# Patient Record
Sex: Female | Born: 1945 | Race: White | Hispanic: No | Marital: Married | Smoking: Former smoker
Health system: Southern US, Community
[De-identification: ages and names within clinical notes are randomized; demographics above are authoritative.]

## PROBLEM LIST (undated history)

## (undated) DIAGNOSIS — D649 Anemia, unspecified: Secondary | ICD-10-CM

## (undated) DIAGNOSIS — M48061 Spinal stenosis, lumbar region without neurogenic claudication: Secondary | ICD-10-CM

## (undated) DIAGNOSIS — Z8619 Personal history of other infectious and parasitic diseases: Secondary | ICD-10-CM

## (undated) DIAGNOSIS — R251 Tremor, unspecified: Secondary | ICD-10-CM

## (undated) DIAGNOSIS — E039 Hypothyroidism, unspecified: Secondary | ICD-10-CM

## (undated) DIAGNOSIS — K649 Unspecified hemorrhoids: Secondary | ICD-10-CM

## (undated) DIAGNOSIS — N189 Chronic kidney disease, unspecified: Secondary | ICD-10-CM

## (undated) DIAGNOSIS — K219 Gastro-esophageal reflux disease without esophagitis: Secondary | ICD-10-CM

## (undated) DIAGNOSIS — Z923 Personal history of irradiation: Secondary | ICD-10-CM

## (undated) DIAGNOSIS — R7303 Prediabetes: Secondary | ICD-10-CM

## (undated) DIAGNOSIS — Z972 Presence of dental prosthetic device (complete) (partial): Secondary | ICD-10-CM

## (undated) DIAGNOSIS — J449 Chronic obstructive pulmonary disease, unspecified: Secondary | ICD-10-CM

## (undated) DIAGNOSIS — Z170 Estrogen receptor positive status [ER+]: Secondary | ICD-10-CM

## (undated) DIAGNOSIS — Z860101 Personal history of adenomatous and serrated colon polyps: Secondary | ICD-10-CM

## (undated) DIAGNOSIS — Z9889 Other specified postprocedural states: Secondary | ICD-10-CM

## (undated) DIAGNOSIS — I10 Essential (primary) hypertension: Secondary | ICD-10-CM

## (undated) DIAGNOSIS — F1920 Other psychoactive substance dependence, uncomplicated: Secondary | ICD-10-CM

## (undated) DIAGNOSIS — R112 Nausea with vomiting, unspecified: Secondary | ICD-10-CM

## (undated) DIAGNOSIS — I519 Heart disease, unspecified: Secondary | ICD-10-CM

## (undated) DIAGNOSIS — G2581 Restless legs syndrome: Secondary | ICD-10-CM

## (undated) DIAGNOSIS — J189 Pneumonia, unspecified organism: Secondary | ICD-10-CM

## (undated) DIAGNOSIS — D013 Carcinoma in situ of anus and anal canal: Secondary | ICD-10-CM

## (undated) DIAGNOSIS — J302 Other seasonal allergic rhinitis: Secondary | ICD-10-CM

## (undated) DIAGNOSIS — M109 Gout, unspecified: Secondary | ICD-10-CM

## (undated) DIAGNOSIS — Z85828 Personal history of other malignant neoplasm of skin: Secondary | ICD-10-CM

## (undated) DIAGNOSIS — K5909 Other constipation: Secondary | ICD-10-CM

## (undated) DIAGNOSIS — K3184 Gastroparesis: Secondary | ICD-10-CM

## (undated) DIAGNOSIS — Z86010 Personal history of colonic polyps: Secondary | ICD-10-CM

## (undated) DIAGNOSIS — G252 Other specified forms of tremor: Secondary | ICD-10-CM

## (undated) DIAGNOSIS — M1990 Unspecified osteoarthritis, unspecified site: Secondary | ICD-10-CM

## (undated) DIAGNOSIS — K6282 Dysplasia of anus: Secondary | ICD-10-CM

## (undated) DIAGNOSIS — G629 Polyneuropathy, unspecified: Secondary | ICD-10-CM

## (undated) DIAGNOSIS — E785 Hyperlipidemia, unspecified: Secondary | ICD-10-CM

## (undated) DIAGNOSIS — C50212 Malignant neoplasm of upper-inner quadrant of left female breast: Secondary | ICD-10-CM

## (undated) DIAGNOSIS — F419 Anxiety disorder, unspecified: Secondary | ICD-10-CM

## (undated) DIAGNOSIS — I499 Cardiac arrhythmia, unspecified: Secondary | ICD-10-CM

## (undated) DIAGNOSIS — R569 Unspecified convulsions: Secondary | ICD-10-CM

## (undated) HISTORY — DX: Hyperlipidemia, unspecified: E78.5

## (undated) HISTORY — PX: RIGHT COLECTOMY: SHX853

## (undated) HISTORY — DX: Tremor, unspecified: R25.1

## (undated) HISTORY — DX: Gastro-esophageal reflux disease without esophagitis: K21.9

## (undated) HISTORY — DX: Gastroparesis: K31.84

## (undated) HISTORY — DX: Chronic obstructive pulmonary disease, unspecified: J44.9

## (undated) HISTORY — DX: Anxiety disorder, unspecified: F41.9

## (undated) HISTORY — DX: Gout, unspecified: M10.9

## (undated) HISTORY — PX: LUMBAR SPINE SURGERY: SHX701

## (undated) HISTORY — PX: BREAST LUMPECTOMY: SHX2

## (undated) HISTORY — DX: Prediabetes: R73.03

## (undated) HISTORY — DX: Restless legs syndrome: G25.81

## (undated) HISTORY — DX: Anemia, unspecified: D64.9

## (undated) HISTORY — PX: MOUTH SURGERY: SHX715

## (undated) HISTORY — PX: HAND SURGERY: SHX662

## (undated) HISTORY — PX: REDUCTION MAMMAPLASTY: SUR839

## (undated) HISTORY — PX: MULTIPLE TOOTH EXTRACTIONS: SHX2053

---

## 1967-04-19 HISTORY — PX: TONSILLECTOMY: SUR1361

## 1970-04-18 HISTORY — PX: BREAST EXCISIONAL BIOPSY: SUR124

## 1980-04-18 HISTORY — PX: TUBAL LIGATION: SHX77

## 1983-04-19 HISTORY — PX: ABDOMINAL HYSTERECTOMY: SHX81

## 1993-04-18 HISTORY — PX: CERVICAL SPINE SURGERY: SHX589

## 1996-04-18 HISTORY — PX: BREAST REDUCTION SURGERY: SHX8

## 1997-10-13 ENCOUNTER — Ambulatory Visit (HOSPITAL_COMMUNITY): Admission: RE | Admit: 1997-10-13 | Discharge: 1997-10-13 | Payer: Self-pay | Admitting: Gastroenterology

## 1997-10-16 ENCOUNTER — Ambulatory Visit (HOSPITAL_COMMUNITY): Admission: RE | Admit: 1997-10-16 | Discharge: 1997-10-16 | Payer: Self-pay | Admitting: Gastroenterology

## 1997-12-30 ENCOUNTER — Ambulatory Visit (HOSPITAL_BASED_OUTPATIENT_CLINIC_OR_DEPARTMENT_OTHER): Admission: RE | Admit: 1997-12-30 | Discharge: 1997-12-30 | Payer: Self-pay | Admitting: Orthopedic Surgery

## 1998-09-07 ENCOUNTER — Encounter: Payer: Self-pay | Admitting: Orthopedic Surgery

## 1998-09-07 ENCOUNTER — Ambulatory Visit (HOSPITAL_COMMUNITY): Admission: RE | Admit: 1998-09-07 | Discharge: 1998-09-07 | Payer: Self-pay | Admitting: Orthopedic Surgery

## 1998-11-24 ENCOUNTER — Encounter: Payer: Self-pay | Admitting: Gastroenterology

## 1998-11-24 ENCOUNTER — Ambulatory Visit (HOSPITAL_COMMUNITY): Admission: RE | Admit: 1998-11-24 | Discharge: 1998-11-24 | Payer: Self-pay | Admitting: Gastroenterology

## 2000-02-08 ENCOUNTER — Encounter: Payer: Self-pay | Admitting: Gastroenterology

## 2000-02-08 ENCOUNTER — Ambulatory Visit (HOSPITAL_COMMUNITY): Admission: RE | Admit: 2000-02-08 | Discharge: 2000-02-08 | Payer: Self-pay | Admitting: Gastroenterology

## 2000-04-18 HISTORY — PX: KNEE ARTHROSCOPY: SHX127

## 2001-02-20 ENCOUNTER — Encounter: Payer: Self-pay | Admitting: Gastroenterology

## 2001-02-20 ENCOUNTER — Encounter: Admission: RE | Admit: 2001-02-20 | Discharge: 2001-02-20 | Payer: Self-pay | Admitting: Gastroenterology

## 2001-06-19 ENCOUNTER — Encounter (INDEPENDENT_AMBULATORY_CARE_PROVIDER_SITE_OTHER): Payer: Self-pay | Admitting: *Deleted

## 2001-06-19 ENCOUNTER — Ambulatory Visit (HOSPITAL_COMMUNITY): Admission: RE | Admit: 2001-06-19 | Discharge: 2001-06-19 | Payer: Self-pay | Admitting: Gastroenterology

## 2002-02-19 ENCOUNTER — Ambulatory Visit (HOSPITAL_BASED_OUTPATIENT_CLINIC_OR_DEPARTMENT_OTHER): Admission: RE | Admit: 2002-02-19 | Discharge: 2002-02-19 | Payer: Self-pay | Admitting: Orthopedic Surgery

## 2002-12-04 ENCOUNTER — Ambulatory Visit (HOSPITAL_COMMUNITY): Admission: RE | Admit: 2002-12-04 | Discharge: 2002-12-04 | Payer: Self-pay | Admitting: Gastroenterology

## 2002-12-04 ENCOUNTER — Encounter: Payer: Self-pay | Admitting: Gastroenterology

## 2003-09-10 ENCOUNTER — Encounter (INDEPENDENT_AMBULATORY_CARE_PROVIDER_SITE_OTHER): Payer: Self-pay | Admitting: Specialist

## 2003-09-10 ENCOUNTER — Inpatient Hospital Stay (HOSPITAL_COMMUNITY): Admission: RE | Admit: 2003-09-10 | Discharge: 2003-09-17 | Payer: Self-pay | Admitting: Surgery

## 2004-04-09 ENCOUNTER — Ambulatory Visit (HOSPITAL_COMMUNITY): Admission: RE | Admit: 2004-04-09 | Discharge: 2004-04-09 | Payer: Self-pay | Admitting: Gastroenterology

## 2005-07-20 ENCOUNTER — Ambulatory Visit (HOSPITAL_COMMUNITY): Admission: RE | Admit: 2005-07-20 | Discharge: 2005-07-20 | Payer: Self-pay | Admitting: Gastroenterology

## 2006-04-18 HISTORY — PX: HEMORRHOID SURGERY: SHX153

## 2006-08-01 ENCOUNTER — Ambulatory Visit (HOSPITAL_COMMUNITY): Admission: RE | Admit: 2006-08-01 | Discharge: 2006-08-01 | Payer: Self-pay | Admitting: Gastroenterology

## 2007-08-10 ENCOUNTER — Ambulatory Visit (HOSPITAL_COMMUNITY): Admission: RE | Admit: 2007-08-10 | Discharge: 2007-08-10 | Payer: Self-pay | Admitting: Gastroenterology

## 2008-04-15 ENCOUNTER — Encounter: Admission: RE | Admit: 2008-04-15 | Discharge: 2008-04-15 | Payer: Self-pay | Admitting: Sports Medicine

## 2008-11-11 ENCOUNTER — Ambulatory Visit (HOSPITAL_COMMUNITY): Admission: RE | Admit: 2008-11-11 | Discharge: 2008-11-11 | Payer: Self-pay | Admitting: Internal Medicine

## 2010-05-09 ENCOUNTER — Encounter: Payer: Self-pay | Admitting: Internal Medicine

## 2010-05-18 ENCOUNTER — Other Ambulatory Visit (HOSPITAL_COMMUNITY): Payer: Self-pay | Admitting: Internal Medicine

## 2010-05-18 DIAGNOSIS — Z139 Encounter for screening, unspecified: Secondary | ICD-10-CM

## 2010-05-21 ENCOUNTER — Ambulatory Visit (HOSPITAL_COMMUNITY)
Admission: RE | Admit: 2010-05-21 | Discharge: 2010-05-21 | Disposition: A | Discharge status: Home / Self Care | Payer: BC Managed Care – PPO | Source: Ambulatory Visit | Attending: Internal Medicine | Admitting: Internal Medicine

## 2010-05-21 DIAGNOSIS — Z139 Encounter for screening, unspecified: Secondary | ICD-10-CM

## 2010-05-21 DIAGNOSIS — Z1231 Encounter for screening mammogram for malignant neoplasm of breast: Secondary | ICD-10-CM

## 2010-09-03 NOTE — Op Note (Signed)
NAME:  NAVIL, KOLE                        ACCOUNT NO.:  [id number]   MEDICAL RECORD NO.:  [id number]                   PATIENT TYPE:  AMB   LOCATION:  DSC                                  FACILITY:  [facility]   PHYSICIAN:  Cindee Salt, M.D.                    DATE OF BIRTH:  [birth year]   DATE OF PROCEDURE:  02/19/2002  DATE OF DISCHARGE:                                 OPERATIVE REPORT   PREOPERATIVE DIAGNOSIS:  Ruptured ulnar collateral ligament, metaphalangeal  joint, left thumb.   POSTOPERATIVE DIAGNOSIS:  Ruptured ulnar collateral ligament, metaphalangeal  joint, left thumb.   OPERATION:  Repair of ulnar collateral ligament, metacarpal phalangeal  joint, left thumb.   SURGEON:  Cindee Salt, M.D.   ASSISTANT:  RN.   ANESTHESIA:  General.   HISTORY:  The patient is a 65 year-old female who suffered a fall  approximately two weeks ago suffering an injury to the metacarpal phalangeal  joint of her thumb. She has pain on the ulnar aspect with gross opening on  stress.   PROCEDURE:  The patient was brought to the operating room where a general  anesthetic was carried out without difficulty.  She was prepped and draped  using Duraprep in the supine position.  Her left arm was freed.  The limb  was exsanguinated with an Esmarch bandage.  The tourniquet was placed high  on the arm and was inflated to 250 mmHg. A Chevron incision was made over  the metaphalangeal joint, ulnar aspect, apex and carried down through  subcutaneous tissue.  Bleeders were electrocauterized. The dorsal sensory  nerve was identified and protected.  An incision was then made in the  adductor aponeurosis. A complete tear of the ulnar collateral ligament off  from the metacarpal head was noted.  The attachment to the proximal phalanx  was intact.  A drill hole was then placed into the metacarpal head.  Using a  3-5 K wire holes were placed into the radial side. A 2-0 Prolene suture was  then used to repair  the collateral ligament into the metacarpal neck at the  old attachment.  This was done with a Bunnell pull type suture but was tied  over the metacarpal head on the radial aspect.  This firmly fixed the joint  in position.  The joint was then pinned with the 3-5 K wire in approximately  five degrees of flexion.  X-rays revealed the pin in good position with no  opening to the ulnar side.  The wound was irrigated.  The adductor  aponeurosis was closed with running 4-0 Mersilene suture.  The skin was  closed with interrupted 5-0 nylon sutures. A sterile compressive dressing  and splint were applied.  The patient tolerated the procedure well and was  taken to the recovery room for observation in satisfactory condition.  She  is discharged home to [facility] [place] [facility] one week on  Percocet and Septra DS.                                               Cindee Salt, M.D.    GK/MEDQ  D:  02/19/2002  T:  02/19/2002  Job:  [id number]

## 2010-09-03 NOTE — Discharge Summary (Signed)
NAME:  LIBERTI, APPLETON                        ACCOUNT NO.:  [id number]   MEDICAL RECORD NO.:  [id number]                   PATIENT TYPE:  INP   LOCATION:  0454                                 FACILITY:  [facility]   PHYSICIAN:  Currie Paris, M.D.           DATE OF BIRTH:  1945/11/29   DATE OF ADMISSION:  09/10/2003  DATE OF DISCHARGE:  09/17/2003                                 DISCHARGE SUMMARY   OFFICE MEDICAL RECORD NUMBER:  [id number].   FINAL DIAGNOSIS:  Multiple ascending colon polyps.   CLINICAL HISTORY:  Ms. Sorto is a 65 year old, found to have multiple cecal  polyps not amenable to colonoscopic resection.  She is scheduled for right  colectomy.   HOSPITAL COURSE:  The patient was admitted and taken to the operating room  on May 25, where a right colectomy was performed.  She tolerated the  procedure well.  Postoperatively, she had a reasonably benign postoperative  course.  She had nausea.  She did have some itching which was questionably  related to her pain medications.  She was started on clear liquids on May  27.  Diet gradually advanced and by June 1, was passing gas, had no nausea,  felt able to go home.  She was given Dulcolax suppository, tolerated that  well, and was discharged later on June 1.   She was discharged to resume her usual medications, Darvocet-N 100 for pain.   Laboratory studies include a preop hemoglobin of 14.8, white count of 9200,  a normal CMET except for nonfasting glucose of 129, a normal urinalysis, an  EKG which was normal, two-view x-ray which showed a little scarring  atelectasis in the lingula but otherwise negative, and a path report which  showed tubular adenomas and hyperplastic polyps of the cecum.                                               Currie Paris, M.D.    CJS/MEDQ  D:  10/11/2003  T:  10/11/2003  Job:  [id number]   cc:   Tasia Catchings, M.D.  [address]  [place]  [place] [address]  Fax: [phone]

## 2010-09-03 NOTE — Op Note (Signed)
NAME:  Brandi Stephens, Brandi Stephens                        ACCOUNT NO.:  [id number]   MEDICAL RECORD NO.:  [id number]                   PATIENT TYPE:  INP   LOCATION:  0001                                 FACILITY:  [facility]   PHYSICIAN:  Currie Paris, M.D.           DATE OF BIRTH:  Feb 16, 1946   DATE OF PROCEDURE:  09/10/2003  DATE OF DISCHARGE:                                 OPERATIVE REPORT   OFFICE MEDICAL RECORD NUMBER:  [id number]   PREOPERATIVE DIAGNOSIS:  Multiple cecal polyps.   POSTOPERATIVE DIAGNOSIS:  Multiple cecal polyps.   OPERATION:  Right colectomy.   SURGEON:  Dr. Jamey Ripa   ASSISTANT:  Dr. Derrell Lolling   ANESTHESIA:  General endotracheal.   CLINICAL HISTORY:  This patient has been evaluated by Dr. Sherin Quarry and found  to have multiple cecal polyps, one of which was too large to remove and at  least by biopsy was benign adenomatous polyp.  The patient elected to  proceed to elective right colectomy and was admitted for surgery.   DESCRIPTION OF PROCEDURE:  The patient was seen in the holding area.  We  reviewed the plans, and she had no further questions.  She was taken to the  operating room and after satisfactory general endotracheal anesthesia had  been obtained, a Foley catheter was placed and the abdomen prepped and  draped.  A transverse incision was made in the right upper quadrant, just  above the umbilicus, and the peritoneal cavity entered under direct vision.  Bimanual exam of the abdomen showed no gross abnormalities, although there  was some polypoid areas palpable in the cecum.   A wound protector was placed and the self-retaining retractor.  The  ascending colon was mobilized and incised along the white line using the  cautery and then freeing up the appendix and terminal ileum from the pelvis  where there were a few adhesions.  This was all done with the cautery and  appeared to be dry.   Once this was done, I could select an area just to the terminal  ileum to  divide the small bowel and found a window in the proximal right transverse  colon mesentery where we could make an opening and find a point to divide  the colon just to the right of the middle colic.  I divided the colon  mesentery down to this little window, ligament onto a silk.  From that  point, I then went up to the terminal ileum, again dividing the mesentery  between clamps and tying with 2-0 silks.  Once this was all done, we spent a  minute trying to make sure everything appeared to be dry and then did the  anastomosis.   The small bowel and colon were tacked together with the silk suture, and the  antimesenteric border of each opened.  The GIA was inserted and fired,  producing a side-to-side stapled anastomosis.  The anastomosis appeared to  be dry.  Using the TA stapler, coming transversely, the common defect was  closed.  The stapler was fired, and the specimen cut off the stapler.  The  staple line appeared to be dry.  The small bowel mesenteric opening was then  closed with 3-0 silks.  We changed gloves and instruments and then  irrigated, checked to make everything was dry and once everything appeared  to be dry, went ahead and closed.  Peritoneum was closed with a running PDS.  The muscle was irrigated and the fascia closed with #1 PDS, the subcu  irrigated and the skin closed with staples.   The patient tolerated the procedure well.  There were no operative  complications.  Estimated blood loss was 150 mL.  All counts were correct.                                               Currie Paris, M.D.    CJS/MEDQ  D:  09/10/2003  T:  09/10/2003  Job:  [id number]   cc:   Tasia Catchings, M.D.  [address]  [place]  [place] [address]  Fax: [phone]

## 2011-03-04 ENCOUNTER — Other Ambulatory Visit (HOSPITAL_COMMUNITY): Payer: Self-pay | Admitting: Internal Medicine

## 2011-03-04 DIAGNOSIS — N289 Disorder of kidney and ureter, unspecified: Secondary | ICD-10-CM

## 2011-03-04 DIAGNOSIS — R791 Abnormal coagulation profile: Secondary | ICD-10-CM

## 2011-03-04 DIAGNOSIS — R05 Cough: Secondary | ICD-10-CM

## 2011-03-07 ENCOUNTER — Other Ambulatory Visit (HOSPITAL_COMMUNITY): Payer: BC Managed Care – PPO

## 2011-03-08 ENCOUNTER — Other Ambulatory Visit (HOSPITAL_COMMUNITY): Payer: Self-pay | Admitting: Internal Medicine

## 2011-03-08 DIAGNOSIS — N289 Disorder of kidney and ureter, unspecified: Secondary | ICD-10-CM

## 2011-03-08 DIAGNOSIS — R05 Cough: Secondary | ICD-10-CM

## 2011-03-08 DIAGNOSIS — R791 Abnormal coagulation profile: Secondary | ICD-10-CM

## 2011-03-11 ENCOUNTER — Other Ambulatory Visit (HOSPITAL_COMMUNITY): Payer: Self-pay | Admitting: Internal Medicine

## 2011-03-11 ENCOUNTER — Ambulatory Visit (HOSPITAL_COMMUNITY)
Admission: RE | Admit: 2011-03-11 | Discharge: 2011-03-11 | Disposition: A | Discharge status: Home / Self Care | Payer: BC Managed Care – PPO | Source: Ambulatory Visit | Attending: Internal Medicine | Admitting: Internal Medicine

## 2011-03-11 ENCOUNTER — Encounter (HOSPITAL_COMMUNITY)
Admission: RE | Admit: 2011-03-11 | Discharge: 2011-03-11 | Disposition: A | Discharge status: Home / Self Care | Payer: BC Managed Care – PPO | Source: Ambulatory Visit | Attending: Internal Medicine | Admitting: Internal Medicine

## 2011-03-11 ENCOUNTER — Ambulatory Visit (HOSPITAL_COMMUNITY): Payer: BC Managed Care – PPO

## 2011-03-11 DIAGNOSIS — N289 Disorder of kidney and ureter, unspecified: Secondary | ICD-10-CM

## 2011-03-11 DIAGNOSIS — R05 Cough: Secondary | ICD-10-CM

## 2011-03-11 DIAGNOSIS — R059 Cough, unspecified: Secondary | ICD-10-CM | POA: Insufficient documentation

## 2011-03-11 DIAGNOSIS — M549 Dorsalgia, unspecified: Secondary | ICD-10-CM | POA: Insufficient documentation

## 2011-03-11 DIAGNOSIS — R791 Abnormal coagulation profile: Secondary | ICD-10-CM | POA: Insufficient documentation

## 2011-03-11 MED ORDER — XENON XE 133 GAS
5.8000 | GAS_FOR_INHALATION | Freq: Once | RESPIRATORY_TRACT | Status: AC | PRN
  Administered 2011-03-11: 5.8 via RESPIRATORY_TRACT

## 2011-03-11 MED ORDER — TECHNETIUM TO 99M ALBUMIN AGGREGATED
5.0000 | Freq: Once | INTRAVENOUS | Status: AC | PRN
  Administered 2011-03-11: 5 via INTRAVENOUS

## 2013-01-11 ENCOUNTER — Other Ambulatory Visit (HOSPITAL_COMMUNITY): Payer: Self-pay | Admitting: Internal Medicine

## 2013-01-11 ENCOUNTER — Other Ambulatory Visit (HOSPITAL_COMMUNITY)
Admission: RE | Admit: 2013-01-11 | Discharge: 2013-01-11 | Disposition: A | Discharge status: Home / Self Care | Payer: Medicare PPO | Source: Ambulatory Visit | Attending: Internal Medicine | Admitting: Internal Medicine

## 2013-01-11 ENCOUNTER — Other Ambulatory Visit: Payer: Self-pay | Admitting: Internal Medicine

## 2013-01-11 DIAGNOSIS — Z124 Encounter for screening for malignant neoplasm of cervix: Secondary | ICD-10-CM | POA: Insufficient documentation

## 2013-01-11 DIAGNOSIS — Z1231 Encounter for screening mammogram for malignant neoplasm of breast: Secondary | ICD-10-CM

## 2013-01-16 ENCOUNTER — Other Ambulatory Visit: Payer: Self-pay | Admitting: Gastroenterology

## 2013-01-16 HISTORY — PX: COLONOSCOPY: SHX174

## 2013-01-16 LAB — HM COLONOSCOPY

## 2013-01-18 ENCOUNTER — Ambulatory Visit (HOSPITAL_COMMUNITY): Payer: BC Managed Care – PPO

## 2013-03-04 ENCOUNTER — Other Ambulatory Visit: Payer: Self-pay | Admitting: Internal Medicine

## 2013-03-04 DIAGNOSIS — G250 Essential tremor: Secondary | ICD-10-CM

## 2013-03-07 ENCOUNTER — Ambulatory Visit
Admission: RE | Admit: 2013-03-07 | Discharge: 2013-03-07 | Disposition: A | Discharge status: Home / Self Care | Payer: Medicare PPO | Source: Ambulatory Visit | Attending: Internal Medicine | Admitting: Internal Medicine

## 2013-03-07 DIAGNOSIS — G250 Essential tremor: Secondary | ICD-10-CM

## 2013-03-19 ENCOUNTER — Encounter (INDEPENDENT_AMBULATORY_CARE_PROVIDER_SITE_OTHER): Payer: Self-pay

## 2013-03-19 ENCOUNTER — Ambulatory Visit (INDEPENDENT_AMBULATORY_CARE_PROVIDER_SITE_OTHER): Payer: Medicare PPO | Admitting: Neurology

## 2013-03-19 ENCOUNTER — Encounter: Payer: Self-pay | Admitting: Neurology

## 2013-03-19 VITALS — BP 130/82 | HR 110 | Ht <= 58 in | Wt 253.0 lb

## 2013-03-19 DIAGNOSIS — R259 Unspecified abnormal involuntary movements: Secondary | ICD-10-CM

## 2013-03-19 DIAGNOSIS — R202 Paresthesia of skin: Secondary | ICD-10-CM

## 2013-03-19 DIAGNOSIS — R209 Unspecified disturbances of skin sensation: Secondary | ICD-10-CM

## 2013-03-19 DIAGNOSIS — R251 Tremor, unspecified: Secondary | ICD-10-CM

## 2013-03-19 MED ORDER — PRIMIDONE 50 MG PO TABS: 25.0000 mg | ORAL_TABLET | Freq: Every day | ORAL | Status: DC

## 2013-03-19 NOTE — Patient Instructions (Signed)
Overall you are doing fairly well but I do want to suggest a few things today:   As far as your medications are concerned, I would like to suggest starting a medication called Primidone. Start by taking 1/2 tablet nightly. This medicine can sometimes be sedating.   As far as diagnostic testing:  1)MRI of the cervical spine  I would like to see you back once the MRI is completed, sooner if we need to. Please call us with any interim questions, concerns, problems, updates or refill requests.   Please also call us for any test results so we can go over those with you on the phone.  My clinical assistant and will answer any of your questions and relay your messages to me and also relay most of my messages to you.   Our phone number is [phone]. We also have an after hours call service for urgent matters and there is a physician on-call for urgent questions. For any emergencies you know to call 911 or go to the nearest emergency room

## 2013-03-19 NOTE — Progress Notes (Signed)
[facility]    Provider:  Dr Hosie Poisson Referring Provider: Raelyn Mora, Ibt* Primary Care Physician:  Raelyn Mora, Brent Bulla, MD  CC:  Tremor  HPI:  Brandi Stephens is a 67 y.o. female here as a referral from Dr. Raelyn Mora for paresthesias and tremor  Notes that tremor has been ongoing for years, son passed away around 3 years ago and feel it has gotten progressively worse since then. Is predominantly an intention/posutral tremor, mild rest component. Really notices tremor when trying to put on makeup. The tremor comes and goes, worse with stress and anxiety. Thinks glass of wine at night may help the tremor. Denies any generalized bradykinesia, no gait instability. Handwriting has gotten messier, thinks it may have gotten slightly smaller. Her mother had a similar tremor. No change in sense of smell. Has trouble staying asleep. Takes tylenol PM. No REM behavior disorder. No family history of any other neurodegenerative processes. No exposure to dopamine blocking agents. Feels that tremor is bothersome, socially is embarrassing.   Also notes history of paresthesias in arms and legs. Described as a pins and needles sensation. Describes distal to proximal pattern in extremities. Feels it is getting progressively worse. Denies any weakness. Has history of cervical degenerative changes, had surgery done at C4-5 or C5-6 (cannot recall level), done in the 90s. Initially after surgery, left arm was numb but has gotten worse in the past few months. Recently had normal head CT and normal B12 level.   Otherwise healthy. Denies any DM.    Review of Systems: Out of a complete 14 system review, the patient complains of only the following symptoms, and all other reviewed systems are negative. Positive for tremor  History   Social History  . Marital Status: Married    Spouse Name: N/A    Number of Children: N/A  . Years of Education: N/A   Occupational History    . Not on file.   Social History Main Topics  . Smoking status: Not on file  . Smokeless tobacco: Not on file  . Alcohol Use: Not on file  . Drug Use: Not on file  . Sexual Activity: Not on file   Other Topics Concern  . Not on file   Social History Narrative  . No narrative on file    No family history on file.  No past medical history on file.  No past surgical history on file.  Current Outpatient Prescriptions  Medication Sig Dispense Refill  . allopurinol (ZYLOPRIM) 100 MG tablet Take 1 tablet by mouth daily.      Marland Kitchen atorvastatin (LIPITOR) 20 MG tablet Take 1 tablet by mouth daily.      Marland Kitchen FLUoxetine (PROZAC) 20 MG capsule Take 1 capsule by mouth daily.      Marland Kitchen levothyroxine (SYNTHROID, LEVOTHROID) 50 MCG tablet Take 1 tablet by mouth daily.      Marland Kitchen LORazepam (ATIVAN) 1 MG tablet Take 1 tablet by mouth 2 (two) times daily as needed.      . valsartan-hydrochlorothiazide (DIOVAN-HCT) 320-12.5 MG per tablet Take 1 tablet by mouth daily.       No current facility-administered medications for this visit.    Allergies as of 03/19/2013 - Review Complete 03/19/2013  Allergen Reaction Noted  . Codeine  03/11/2011  . Morphine and related  03/11/2011  . Penicillins  03/11/2011    Vitals: BP 130/82  Pulse 110  Ht 4' 9.75" (1.467 m)  Wt 253 lb (114.76 kg)  BMI 53.32  kg/m2 Last Weight:  Wt Readings from Last 1 Encounters:  03/19/13 253 lb (114.76 kg)   Last Height:   Ht Readings from Last 1 Encounters:  03/19/13 4' 9.75" (1.467 m)     Physical exam: Exam: Gen: NAD, conversant Eyes: anicteric sclerae, moist conjunctivae HENT: Atraumatic, oropharynx clear Neck: Trachea midline; supple,  Lungs: CTA, no wheezing, rales, rhonic                          CV: RRR, no MRG Abdomen: Soft, non-tender;  Extremities: No peripheral edema  Skin: Normal temperature, no rash,  Psych: Appropriate affect, pleasant  Neuro: MS: AA&Ox3, appropriately interactive, normal affect    Speech: fluent w/o paraphasic error  Memory: good recent and remote recall  CN: PERRL, EOMI no nystagmus, no ptosis, sensation intact to LT V1-V3 bilat, face symmetric, no weakness, hearing grossly intact, palate elevates symmetrically, shoulder shrug 5/5 bilat,  tongue protrudes midline, no fasiculations noted.  Motor: normal bulk and tone Strength: 5/5  In all extremities  Coord: No rest tremor noted, bilateral left greater than right postural and intention tremor no bradykinesia noted   Reflexes: symmetrical, bilat downgoing toes  Sens: LT intact in all extremities  Gait: posture, stance, stride and arm-swing normal. Tandem gait intact. Romberg absent.   Assessment:  After physical and neurologic examination, review of laboratory studies, imaging, neurophysiology testing and pre-existing records, assessment will be reviewed on the problem list.  Plan:  Treatment plan and additional workup will be reviewed under Problem List.  1)Tremor 2)Paresthesias  67 year old woman presenting for initial evaluation of tremor and paresthesias of all 4 extremities. 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 This diagnosis was discussed extensively with patient. Will start patient on low-dose primidone, 25 mg, nightly. Can titrate this up in the future as tolerated. Unclear etiology of her paresthesias, based the fact that all 4 extremities are involved and she is history of degenerative cervical changes will order MRI of the C-spine at this time. Followup once MRI completed.     Elspeth Cho, DO  [facility] [address] [place], [place] [address]  Phone [phone] Fax [phone]

## 2013-04-01 DIAGNOSIS — R202 Paresthesia of skin: Secondary | ICD-10-CM | POA: Insufficient documentation

## 2013-04-02 ENCOUNTER — Ambulatory Visit
Admission: RE | Admit: 2013-04-02 | Discharge: 2013-04-02 | Disposition: A | Discharge status: Home / Self Care | Payer: Medicare PPO | Source: Ambulatory Visit | Attending: Neurology | Admitting: Neurology

## 2013-04-02 DIAGNOSIS — R202 Paresthesia of skin: Secondary | ICD-10-CM

## 2013-04-02 DIAGNOSIS — R209 Unspecified disturbances of skin sensation: Secondary | ICD-10-CM

## 2013-04-05 ENCOUNTER — Telehealth: Payer: Self-pay | Admitting: Neurology

## 2013-04-05 ENCOUNTER — Other Ambulatory Visit: Payer: Self-pay | Admitting: Neurology

## 2013-04-05 DIAGNOSIS — M549 Dorsalgia, unspecified: Secondary | ICD-10-CM

## 2013-04-05 MED ORDER — GABAPENTIN 100 MG PO CAPS: 100.0000 mg | ORAL_CAPSULE | Freq: Three times a day (TID) | ORAL | Status: DC

## 2013-04-05 NOTE — Telephone Encounter (Signed)
Please let her know I sent a prescription for neurontin 100mg  capsules to be taken 3 times a day. Thanks.

## 2013-04-05 NOTE — Telephone Encounter (Signed)
I called patient and let her know an Rx for neurontin was called in to her [facility] for her. I reviewed instructions for taking.

## 2013-04-05 NOTE — Telephone Encounter (Signed)
MRI results discussed with patient. Will place referral for PT evaluation.

## 2013-04-18 DIAGNOSIS — Z9889 Other specified postprocedural states: Secondary | ICD-10-CM

## 2013-04-18 DIAGNOSIS — Z85828 Personal history of other malignant neoplasm of skin: Secondary | ICD-10-CM

## 2013-04-18 HISTORY — DX: Personal history of other malignant neoplasm of skin: Z85.828

## 2013-04-18 HISTORY — DX: Other specified postprocedural states: Z98.890

## 2013-11-14 ENCOUNTER — Encounter: Payer: Self-pay | Admitting: Neurology

## 2013-11-14 ENCOUNTER — Ambulatory Visit (INDEPENDENT_AMBULATORY_CARE_PROVIDER_SITE_OTHER): Payer: Medicare PPO | Admitting: Neurology

## 2013-11-14 ENCOUNTER — Ambulatory Visit: Payer: Medicare PPO | Admitting: Neurology

## 2013-11-14 VITALS — BP 144/85 | HR 86 | Ht 59.0 in | Wt 252.0 lb

## 2013-11-14 DIAGNOSIS — M5090 Cervical disc disorder, unspecified, unspecified cervical region: Secondary | ICD-10-CM

## 2013-11-14 DIAGNOSIS — R259 Unspecified abnormal involuntary movements: Secondary | ICD-10-CM

## 2013-11-14 DIAGNOSIS — R251 Tremor, unspecified: Secondary | ICD-10-CM

## 2013-11-14 MED ORDER — PRIMIDONE 50 MG PO TABS: 25.0000 mg | ORAL_TABLET | Freq: Every day | ORAL | Status: DC

## 2013-11-14 NOTE — Progress Notes (Signed)
[facility]    Provider:  Dr Janann Colonel Referring Provider: Nena Jordan, Ibt* Primary Care Physician:  Nena Jordan, Mammie Lorenzo, MD  CC:  Tremor  HPI:  Brandi Stephens is a 68 y.o. female here as a follow up from Dr. Nena Jordan for paresthesias and tremor. At last visit her tremor was consistent with a diagnosis of ET and she was started on Primidone 25mg  nightly. She also had a MRI of the C spine completed which showed multilevel degenerative changes. Since last visit she notes worsening of her neck pain and some shooting pain radiating down her left arm. Mainly into the C6 distribution. Notes some weakness. Described as a paresthesia and burning sensation.   Also notes some pain in her lumbar spine and radiating pain down the lateral portion of the right leg to her knee. Comes and goes, is getting progressively worse. Described as a sharp stabbing pain. Having a hard time walking without pain, trouble bending down to pick up her grandchild. No change in bowel/bladder.  Notes the tremor is improved on the primidone. Tolerating primidone well, no excessive fatigue.    Initial visit  Notes that tremor has been ongoing for years, son passed away around 3 years ago and feel it has gotten progressively worse since then. Is predominantly an intention/posutral tremor, mild rest component. Really notices tremor when trying to put on makeup. The tremor comes and goes, worse with stress and anxiety. Thinks glass of wine at night may help the tremor. Denies any generalized bradykinesia, no gait instability. Handwriting has gotten messier, thinks it may have gotten slightly smaller. Her mother had a similar tremor. No change in sense of smell. Has trouble staying asleep. Takes tylenol PM. No REM behavior disorder. No family history of any other neurodegenerative processes. No exposure to dopamine blocking agents. Feels that tremor is bothersome, socially is embarrassing.    Also notes history of paresthesias in arms and legs. Described as a pins and needles sensation. Describes distal to proximal pattern in extremities. Feels it is getting progressively worse. Denies any weakness. Has history of cervical degenerative changes, had surgery done at C4-5 or C5-6 (cannot recall level), done in the 90s. Initially after surgery, left arm was numb but has gotten worse in the past few months. Recently had normal head CT and normal B12 level.   Otherwise healthy. Denies any DM.    Review of Systems: Out of a complete 14 system review, the patient complains of only the following symptoms, and all other reviewed systems are negative. Positive for tremor, back pain, joint swelling, neck stiffness, numbness,   History   Social History  . Marital Status: Married    Spouse Name: N/A    Number of Children: N/A  . Years of Education: N/A   Occupational History  . Not on file.   Social History Main Topics  . Smoking status: Never Smoker   . Smokeless tobacco: Not on file  . Alcohol Use: Not on file  . Drug Use: Not on file  . Sexual Activity: Not on file   Other Topics Concern  . Not on file   Social History Narrative  . No narrative on file    No family history on file.  No past medical history on file.  No past surgical history on file.  Current Outpatient Prescriptions  Medication Sig Dispense Refill  . allopurinol (ZYLOPRIM) 100 MG tablet Take 1 tablet by mouth daily.      Marland Kitchen atorvastatin (LIPITOR)  20 MG tablet Take 1 tablet by mouth daily.      Marland Kitchen FLUoxetine (PROZAC) 20 MG capsule Take 1 capsule by mouth daily.      Marland Kitchen gabapentin (NEURONTIN) 100 MG capsule Take 1 capsule (100 mg total) by mouth 3 (three) times daily.  90 capsule  3  . levothyroxine (SYNTHROID, LEVOTHROID) 50 MCG tablet Take 1 tablet by mouth daily.      Marland Kitchen LORazepam (ATIVAN) 1 MG tablet Take 1 tablet by mouth 2 (two) times daily as needed.      . primidone (MYSOLINE) 50 MG tablet Take  0.5 tablets (25 mg total) by mouth at bedtime.  60 tablet  3  . valsartan-hydrochlorothiazide (DIOVAN-HCT) 320-12.5 MG per tablet Take 1 tablet by mouth daily.       No current facility-administered medications for this visit.    Allergies as of 11/14/2013 - Review Complete 03/19/2013  Allergen Reaction Noted  . Codeine  03/11/2011  . Morphine and related  03/11/2011  . Penicillins  03/11/2011    Vitals: There were no vitals taken for this visit. Last Weight:  Wt Readings from Last 1 Encounters:  03/19/13 253 lb (114.76 kg)   Last Height:   Ht Readings from Last 1 Encounters:  03/19/13 4' 9.75" (1.467 m)     Physical exam: Exam: Gen: NAD, conversant Eyes: anicteric sclerae, moist conjunctivae HENT: Atraumatic, oropharynx clear Neck: Trachea midline; supple,  Lungs: CTA, no wheezing, rales, rhonic                          CV: RRR, no MRG Abdomen: Soft, non-tender;  Extremities: No peripheral edema  Skin: Normal temperature, no rash,  Psych: Appropriate affect, pleasant  Neuro: MS: AA&Ox3, appropriately interactive, normal affect   Speech: fluent w/o paraphasic error  Memory: good recent and remote recall  CN: PERRL, EOMI no nystagmus, no ptosis, sensation intact to LT V1-V3 bilat, face symmetric, no weakness, hearing grossly intact, palate elevates symmetrically, shoulder shrug 5/5 bilat,  tongue protrudes midline, no fasiculations noted.  Motor: normal bulk and tone Strength: 5/5  In all extremities  Coord: No rest tremor noted, bilateral left greater than right postural and intention tremor no bradykinesia noted   Reflexes: mildly decreased R patellar reflexl, bilat downgoing toes  Sens: decreased LT in C6 distribution of LUE  Gait: posture, stance, stride and arm-swing normal. Tandem gait intact. Romberg absent.   Assessment:  After physical and neurologic examination, review of laboratory studies, imaging, neurophysiology testing and pre-existing  records, assessment will be reviewed on the problem list.  Plan:  Treatment plan and additional workup will be reviewed under Problem List.  1)Tremor 2)Paresthesias  68 year old woman presenting for follow evaluation of tremor with new concerns of radicular type pain in LUE and RLE. She has history of prior cervical surgery and multilevel degenerative changes. 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 Responded well to primidone and she is tolerating it well. Discussed EMG/NCS with patient who wishes to hold off at this point. Will place referral to PT and pain clinic for possible steroid injections. Follow up as needed.     Jim Like, DO  [facility] [address] [place], [place] [address]  Phone [phone] Fax [phone]

## 2013-11-14 NOTE — Patient Instructions (Signed)
Overall you are doing fairly well but I do want to suggest a few things today:   Remember to drink plenty of fluid, eat healthy meals and do not skip any meals. Try to eat protein with a every meal and eat a healthy snack such as fruit or nuts in between meals. Try to keep a regular sleep-wake schedule and try to exercise daily, particularly in the form of walking, 20-30 minutes a day, if you can.   As far as your medications are concerned, I would like to suggest making no changes at this time  Please follow up with physical therapy  I would like you to see a pain specialist to help treat your symptoms. You will be called to schedule this.   Please call us with any interim questions, concerns, problems, updates or refill requests.   My clinical assistant and will answer any of your questions and relay your messages to me and also relay most of my messages to you.   Our phone number is [phone]. We also have an after hours call service for urgent matters and there is a physician on-call for urgent questions. For any emergencies you know to call 911 or go to the nearest emergency room

## 2013-11-25 ENCOUNTER — Ambulatory Visit: Payer: Medicare PPO | Admitting: Neurology

## 2013-11-29 ENCOUNTER — Telehealth: Payer: Self-pay | Admitting: Neurology

## 2013-11-29 NOTE — Telephone Encounter (Signed)
Patient calling to state that she's in a lot of pain and still has not heard from a pain management clinic yet, please return call and advise.

## 2013-12-02 NOTE — Telephone Encounter (Signed)
Sent a fax with the referral to see if the patient been scheduled yet.

## 2013-12-03 ENCOUNTER — Ambulatory Visit: Payer: Medicare PPO | Attending: Neurology

## 2013-12-03 DIAGNOSIS — M542 Cervicalgia: Secondary | ICD-10-CM | POA: Diagnosis not present

## 2013-12-03 DIAGNOSIS — IMO0001 Reserved for inherently not codable concepts without codable children: Secondary | ICD-10-CM | POA: Insufficient documentation

## 2013-12-03 DIAGNOSIS — M545 Low back pain, unspecified: Secondary | ICD-10-CM | POA: Insufficient documentation

## 2013-12-05 NOTE — Telephone Encounter (Signed)
Patient stated Dr. Ace Gins never received Pain Management referral.  Please call anytime and advise.

## 2013-12-05 NOTE — Telephone Encounter (Signed)
Resent referral  

## 2013-12-06 NOTE — Telephone Encounter (Signed)
Spoke to patient. Informed referral was resent on 12/05/13. Patient was grateful.

## 2013-12-12 ENCOUNTER — Ambulatory Visit: Payer: Medicare PPO | Admitting: Physical Therapy

## 2013-12-12 DIAGNOSIS — IMO0001 Reserved for inherently not codable concepts without codable children: Secondary | ICD-10-CM | POA: Diagnosis not present

## 2013-12-17 ENCOUNTER — Ambulatory Visit: Payer: Medicare PPO | Attending: Neurology | Admitting: Physical Therapy

## 2013-12-17 DIAGNOSIS — M542 Cervicalgia: Secondary | ICD-10-CM | POA: Diagnosis not present

## 2013-12-17 DIAGNOSIS — M545 Low back pain, unspecified: Secondary | ICD-10-CM | POA: Diagnosis not present

## 2013-12-17 DIAGNOSIS — IMO0001 Reserved for inherently not codable concepts without codable children: Secondary | ICD-10-CM | POA: Diagnosis not present

## 2013-12-19 ENCOUNTER — Ambulatory Visit: Payer: Medicare PPO | Admitting: Rehabilitation

## 2013-12-19 DIAGNOSIS — IMO0001 Reserved for inherently not codable concepts without codable children: Secondary | ICD-10-CM | POA: Diagnosis not present

## 2013-12-31 ENCOUNTER — Encounter: Payer: Medicare PPO | Admitting: Physical Therapy

## 2014-01-02 ENCOUNTER — Encounter: Payer: Medicare PPO | Admitting: Physical Therapy

## 2014-01-07 ENCOUNTER — Ambulatory Visit: Payer: Medicare PPO | Admitting: Physical Therapy

## 2014-01-07 DIAGNOSIS — IMO0001 Reserved for inherently not codable concepts without codable children: Secondary | ICD-10-CM | POA: Diagnosis not present

## 2014-01-09 ENCOUNTER — Ambulatory Visit: Payer: Medicare PPO | Admitting: Physical Therapy

## 2014-01-09 DIAGNOSIS — IMO0001 Reserved for inherently not codable concepts without codable children: Secondary | ICD-10-CM | POA: Diagnosis not present

## 2014-01-14 ENCOUNTER — Encounter (INDEPENDENT_AMBULATORY_CARE_PROVIDER_SITE_OTHER): Payer: Self-pay

## 2014-01-14 ENCOUNTER — Ambulatory Visit
Admission: RE | Admit: 2014-01-14 | Discharge: 2014-01-14 | Disposition: A | Discharge status: Home / Self Care | Payer: Medicare PPO | Source: Ambulatory Visit | Attending: Internal Medicine | Admitting: Internal Medicine

## 2014-01-14 ENCOUNTER — Other Ambulatory Visit: Payer: Self-pay | Admitting: Internal Medicine

## 2014-01-14 DIAGNOSIS — M25476 Effusion, unspecified foot: Principal | ICD-10-CM

## 2014-01-14 DIAGNOSIS — M25473 Effusion, unspecified ankle: Secondary | ICD-10-CM

## 2014-01-16 HISTORY — PX: BASAL CELL CARCINOMA EXCISION: SHX1214

## 2014-01-20 ENCOUNTER — Encounter: Payer: Medicare PPO | Admitting: Physical Therapy

## 2014-01-22 ENCOUNTER — Encounter: Payer: Medicare PPO | Admitting: Physical Therapy

## 2014-01-27 ENCOUNTER — Encounter: Payer: Medicare PPO | Admitting: Physical Therapy

## 2014-01-27 ENCOUNTER — Ambulatory Visit: Payer: Medicare PPO | Admitting: Physical Therapy

## 2014-01-29 ENCOUNTER — Ambulatory Visit: Payer: Medicare PPO | Attending: Neurology

## 2014-02-03 LAB — HM DEXA SCAN

## 2014-02-05 ENCOUNTER — Other Ambulatory Visit: Payer: Self-pay | Admitting: Dermatology

## 2014-03-17 ENCOUNTER — Other Ambulatory Visit: Payer: Self-pay | Admitting: Dermatology

## 2014-04-18 HISTORY — PX: EYE SURGERY: SHX253

## 2014-04-18 HISTORY — PX: CATARACT EXTRACTION W/ INTRAOCULAR LENS IMPLANT: SHX1309

## 2014-05-14 DIAGNOSIS — Z8619 Personal history of other infectious and parasitic diseases: Secondary | ICD-10-CM

## 2014-05-14 HISTORY — DX: Personal history of other infectious and parasitic diseases: Z86.19

## 2014-11-13 ENCOUNTER — Other Ambulatory Visit: Payer: Self-pay | Admitting: Internal Medicine

## 2014-11-13 DIAGNOSIS — M5431 Sciatica, right side: Secondary | ICD-10-CM

## 2014-11-22 ENCOUNTER — Ambulatory Visit
Admission: RE | Admit: 2014-11-22 | Discharge: 2014-11-22 | Disposition: A | Discharge status: Home / Self Care | Payer: Commercial Managed Care - HMO | Source: Ambulatory Visit | Attending: Internal Medicine | Admitting: Internal Medicine

## 2014-11-22 DIAGNOSIS — M5431 Sciatica, right side: Secondary | ICD-10-CM

## 2015-02-24 ENCOUNTER — Other Ambulatory Visit: Payer: Self-pay | Admitting: Neurosurgery

## 2015-03-16 ENCOUNTER — Encounter (HOSPITAL_COMMUNITY): Payer: Self-pay

## 2015-03-16 ENCOUNTER — Other Ambulatory Visit: Payer: Self-pay

## 2015-03-16 ENCOUNTER — Encounter (HOSPITAL_COMMUNITY)
Admission: RE | Admit: 2015-03-16 | Discharge: 2015-03-16 | Disposition: A | Discharge status: Home / Self Care | Payer: Commercial Managed Care - HMO | Source: Ambulatory Visit | Attending: Neurosurgery | Admitting: Neurosurgery

## 2015-03-16 DIAGNOSIS — E039 Hypothyroidism, unspecified: Secondary | ICD-10-CM | POA: Insufficient documentation

## 2015-03-16 DIAGNOSIS — R000 Tachycardia, unspecified: Secondary | ICD-10-CM | POA: Insufficient documentation

## 2015-03-16 DIAGNOSIS — Z01812 Encounter for preprocedural laboratory examination: Secondary | ICD-10-CM | POA: Diagnosis not present

## 2015-03-16 DIAGNOSIS — I10 Essential (primary) hypertension: Secondary | ICD-10-CM | POA: Diagnosis not present

## 2015-03-16 DIAGNOSIS — M4726 Other spondylosis with radiculopathy, lumbar region: Secondary | ICD-10-CM | POA: Diagnosis not present

## 2015-03-16 DIAGNOSIS — Z87891 Personal history of nicotine dependence: Secondary | ICD-10-CM | POA: Insufficient documentation

## 2015-03-16 DIAGNOSIS — Z01818 Encounter for other preprocedural examination: Secondary | ICD-10-CM | POA: Insufficient documentation

## 2015-03-16 DIAGNOSIS — Z0183 Encounter for blood typing: Secondary | ICD-10-CM | POA: Insufficient documentation

## 2015-03-16 DIAGNOSIS — Z79899 Other long term (current) drug therapy: Secondary | ICD-10-CM | POA: Diagnosis not present

## 2015-03-16 HISTORY — DX: Nausea with vomiting, unspecified: R11.2

## 2015-03-16 HISTORY — DX: Hypothyroidism, unspecified: E03.9

## 2015-03-16 HISTORY — DX: Unspecified osteoarthritis, unspecified site: M19.90

## 2015-03-16 HISTORY — DX: Other specified postprocedural states: Z98.890

## 2015-03-16 HISTORY — DX: Nausea with vomiting, unspecified: Z98.890

## 2015-03-16 HISTORY — DX: Unspecified hemorrhoids: K64.9

## 2015-03-16 HISTORY — DX: Essential (primary) hypertension: I10

## 2015-03-16 LAB — CBC
HEMATOCRIT: 44.8 % (ref 36.0–46.0)
HEMOGLOBIN: 15 g/dL (ref 12.0–15.0)
MCH: 33.3 pg (ref 26.0–34.0)
MCHC: 33.5 g/dL (ref 30.0–36.0)
MCV: 99.6 fL (ref 78.0–100.0)
Platelets: 270 10*3/uL (ref 150–400)
RBC: 4.5 MIL/uL (ref 3.87–5.11)
RDW: 15.2 % (ref 11.5–15.5)
WBC: 14.5 10*3/uL — ABNORMAL HIGH (ref 4.0–10.5)

## 2015-03-16 LAB — BASIC METABOLIC PANEL
Anion gap: 8 (ref 5–15)
BUN: 20 mg/dL (ref 6–20)
CALCIUM: 9.9 mg/dL (ref 8.9–10.3)
CHLORIDE: 105 mmol/L (ref 101–111)
CO2: 28 mmol/L (ref 22–32)
CREATININE: 1.12 mg/dL — AB (ref 0.44–1.00)
GFR calc Af Amer: 57 mL/min — ABNORMAL LOW (ref >60)
GFR calc non Af Amer: 49 mL/min — ABNORMAL LOW (ref >60)
GLUCOSE: 116 mg/dL — AB (ref 65–99)
Potassium: 4.1 mmol/L (ref 3.5–5.1)
Sodium: 141 mmol/L (ref 135–145)

## 2015-03-16 LAB — SURGICAL PCR SCREEN
MRSA, PCR: NEGATIVE
Staphylococcus aureus: NEGATIVE

## 2015-03-16 LAB — TYPE AND SCREEN
ABO/RH(D): A POS
ANTIBODY SCREEN: NEGATIVE

## 2015-03-16 LAB — ABO/RH: ABO/RH(D): A POS

## 2015-03-16 NOTE — Pre-Procedure Instructions (Signed)
    Brandi Stephens  03/16/2015      [id number] [place] [place] [id number] [place] [id number] Phone: [phone] Fax: [phone]    Your procedure is scheduled on Friday, Dec. 2  Report to [facility] Admitting at 9:00 A.M.  Call this number if you have problems the morning of surgery:  [phone]               Any questions prior to surgery call [phone] Monday-Friday 8am-4pm   Remember:  Do not eat food or drink liquids after midnight Thursday, dec. 1  Take these medicines the morning of surgery with A SIP OF WATER :allopurinol,  Baclofen if needed, hydrocodone if needed,  Levothyroxine, lorazepam if needed,   Do not wear jewelry, make-up or nail polish.  Do not wear lotions, powders, or perfumes.  You may wear deodorant.  Do not shave 48 hours prior to surgery.  Men may shave face and neck.  Do not bring valuables to the hospital.  [facility] is not responsible for any belongings or valuables.  Contacts, dentures or bridgework may not be worn into surgery.  Leave your suitcase in the car.  After surgery it may be brought to your room.  For patients admitted to the hospital, discharge time will be determined by your treatment team.    Name and phone number of your driver:    Special instructions:  Review preparing for surgery  Please read over the following fact sheets that you were given. Pain Booklet, Coughing and Deep Breathing, Blood Transfusion Information, MRSA Information and Surgical Site Infection Prevention

## 2015-03-16 NOTE — Progress Notes (Signed)
PCP: Dr. Quenton Fetter Denies cardiologist When pt. Arrived to preadmission heart rate 122. Denies sob, chest pain or feeling sick, no fever. States she is has been in a lot of pain form her back. Notified allison Zelenack,PA of heart rate.  Ekg obtained. Heart rate 108. Pt. Stating feeling some dizziness. After concluding preadmission visit, pt. Reporting she feels like her normal self. Instructed her if any new symptoms ocure to go to emergency room and or notify her PCP.

## 2015-03-17 NOTE — Progress Notes (Signed)
Anesthesia Chart Review:  Pt is a 69 year old female scheduled for L4-5, L5-S1 PLIF on 03/20/2015 with Dr. Kathyrn Sheriff.   PCP is Dr. Melanie Crazier [facility] with [facility].   PMH includes:  HTN, hypothyroidism, post-op N/V.  Former smoker. BMI 38.   Medications include: lipitor, lasix, potassium, levothyroxine, primidone, valsartan-hctz.   EKG 03/16/15: Sinus tachycardia (108 bpm). Nonspecific ST abnormality  HR was 122 at PAT. Records from PCP's office show HR was 88 on 01/16/15, and TSH was 0.44 at that time (low end of normal). I called pt at home. She denies any sx of hyperthyroidism. Pt is to check resting pulse and if higher than 100, see her PCP to get her thyroid reevaluated prior to surgery.   If HR acceptable DOS, I anticipate pt can proceed as scheduled.   Willeen Cass, FNP-BC [facility]/Anesthesiology Phone: [phone] 03/17/2015 3:24 PM

## 2015-03-19 MED ORDER — VANCOMYCIN HCL 10 G IV SOLR
1500.0000 mg | INTRAVENOUS | Status: AC
  Administered 2015-03-20: 1500 mg via INTRAVENOUS
  Filled 2015-03-19: qty 1500

## 2015-03-20 ENCOUNTER — Inpatient Hospital Stay (HOSPITAL_COMMUNITY): Payer: Commercial Managed Care - HMO | Admitting: Anesthesiology

## 2015-03-20 ENCOUNTER — Encounter (HOSPITAL_COMMUNITY)
Admission: RE | Disposition: A | Discharge status: Home Health | Payer: Self-pay | Source: Ambulatory Visit | Attending: Neurosurgery

## 2015-03-20 ENCOUNTER — Inpatient Hospital Stay (HOSPITAL_COMMUNITY): Payer: Commercial Managed Care - HMO | Admitting: Vascular Surgery

## 2015-03-20 ENCOUNTER — Encounter (HOSPITAL_COMMUNITY): Payer: Self-pay | Admitting: *Deleted

## 2015-03-20 ENCOUNTER — Inpatient Hospital Stay (HOSPITAL_COMMUNITY): Payer: Commercial Managed Care - HMO

## 2015-03-20 ENCOUNTER — Inpatient Hospital Stay (HOSPITAL_COMMUNITY)
Admission: RE | Admit: 2015-03-20 | Discharge: 2015-03-25 | DRG: 460 | Disposition: A | Discharge status: Home Health | Payer: Commercial Managed Care - HMO | Source: Ambulatory Visit | Attending: Neurosurgery | Admitting: Neurosurgery

## 2015-03-20 DIAGNOSIS — Z880 Allergy status to penicillin: Secondary | ICD-10-CM

## 2015-03-20 DIAGNOSIS — R509 Fever, unspecified: Secondary | ICD-10-CM

## 2015-03-20 DIAGNOSIS — M4726 Other spondylosis with radiculopathy, lumbar region: Secondary | ICD-10-CM

## 2015-03-20 DIAGNOSIS — Z85828 Personal history of other malignant neoplasm of skin: Secondary | ICD-10-CM | POA: Diagnosis not present

## 2015-03-20 DIAGNOSIS — E039 Hypothyroidism, unspecified: Secondary | ICD-10-CM | POA: Diagnosis present

## 2015-03-20 DIAGNOSIS — Z885 Allergy status to narcotic agent status: Secondary | ICD-10-CM | POA: Diagnosis not present

## 2015-03-20 DIAGNOSIS — M4806 Spinal stenosis, lumbar region: Principal | ICD-10-CM | POA: Diagnosis present

## 2015-03-20 DIAGNOSIS — Z79899 Other long term (current) drug therapy: Secondary | ICD-10-CM

## 2015-03-20 DIAGNOSIS — I10 Essential (primary) hypertension: Secondary | ICD-10-CM | POA: Diagnosis present

## 2015-03-20 DIAGNOSIS — R05 Cough: Secondary | ICD-10-CM | POA: Diagnosis not present

## 2015-03-20 DIAGNOSIS — Z87891 Personal history of nicotine dependence: Secondary | ICD-10-CM

## 2015-03-20 DIAGNOSIS — Z419 Encounter for procedure for purposes other than remedying health state, unspecified: Secondary | ICD-10-CM

## 2015-03-20 DIAGNOSIS — M47816 Spondylosis without myelopathy or radiculopathy, lumbar region: Secondary | ICD-10-CM | POA: Diagnosis present

## 2015-03-20 SURGERY — POSTERIOR LUMBAR FUSION 2 LEVEL
Anesthesia: General | Site: Spine Lumbar

## 2015-03-20 MED ORDER — PHENYLEPHRINE HCL 10 MG/ML IJ SOLN
10.0000 mg | INTRAVENOUS | Status: DC | PRN
  Administered 2015-03-20: 80 ug/min via INTRAVENOUS

## 2015-03-20 MED ORDER — SUCCINYLCHOLINE CHLORIDE 20 MG/ML IJ SOLN
INTRAMUSCULAR | Status: AC
  Filled 2015-03-20: qty 1

## 2015-03-20 MED ORDER — POTASSIUM CHLORIDE CRYS ER 10 MEQ PO TBCR
10.0000 meq | EXTENDED_RELEASE_TABLET | Freq: Every day | ORAL | Status: DC
  Administered 2015-03-23 – 2015-03-25 (×3): 10 meq via ORAL
  Filled 2015-03-20 (×5): qty 1

## 2015-03-20 MED ORDER — LIDOCAINE-EPINEPHRINE 1 %-1:100000 IJ SOLN
INTRAMUSCULAR | Status: DC | PRN
  Administered 2015-03-20: 10 mL

## 2015-03-20 MED ORDER — THROMBIN 5000 UNITS EX SOLR
OROMUCOSAL | Status: DC | PRN
  Administered 2015-03-20: 12:00:00 via TOPICAL

## 2015-03-20 MED ORDER — DIPHENHYDRAMINE HCL 50 MG/ML IJ SOLN
INTRAMUSCULAR | Status: DC | PRN
  Administered 2015-03-20: 12.5 mg via INTRAVENOUS

## 2015-03-20 MED ORDER — LIDOCAINE HCL (CARDIAC) 20 MG/ML IV SOLN
INTRAVENOUS | Status: DC | PRN
  Administered 2015-03-20: 100 mg via INTRAVENOUS

## 2015-03-20 MED ORDER — FENTANYL CITRATE (PF) 100 MCG/2ML IJ SOLN
INTRAMUSCULAR | Status: DC | PRN
  Administered 2015-03-20: 50 ug via INTRAVENOUS
  Administered 2015-03-20 (×3): 25 ug via INTRAVENOUS
  Administered 2015-03-20: 150 ug via INTRAVENOUS
  Administered 2015-03-20: 25 ug via INTRAVENOUS

## 2015-03-20 MED ORDER — OXYCODONE-ACETAMINOPHEN 5-325 MG PO TABS
1.0000 | ORAL_TABLET | ORAL | Status: DC | PRN
  Administered 2015-03-20 – 2015-03-22 (×9): 2 via ORAL
  Administered 2015-03-23: 1 via ORAL
  Administered 2015-03-23 – 2015-03-25 (×7): 2 via ORAL
  Filled 2015-03-20 (×6): qty 2
  Filled 2015-03-20: qty 1
  Filled 2015-03-20 (×9): qty 2

## 2015-03-20 MED ORDER — PROPOFOL 10 MG/ML IV BOLUS
INTRAVENOUS | Status: AC
  Filled 2015-03-20: qty 20

## 2015-03-20 MED ORDER — FENTANYL CITRATE (PF) 250 MCG/5ML IJ SOLN
INTRAMUSCULAR | Status: AC
  Filled 2015-03-20: qty 5

## 2015-03-20 MED ORDER — LIDOCAINE HCL (CARDIAC) 20 MG/ML IV SOLN
INTRAVENOUS | Status: AC
  Filled 2015-03-20: qty 5

## 2015-03-20 MED ORDER — FLEET ENEMA 7-19 GM/118ML RE ENEM: 1.0000 | ENEMA | Freq: Once | RECTAL | Status: DC | PRN

## 2015-03-20 MED ORDER — OXYCODONE-ACETAMINOPHEN 5-325 MG PO TABS
ORAL_TABLET | ORAL | Status: AC
  Filled 2015-03-20: qty 2

## 2015-03-20 MED ORDER — ALLOPURINOL 100 MG PO TABS
100.0000 mg | ORAL_TABLET | Freq: Every day | ORAL | Status: DC
  Administered 2015-03-21 – 2015-03-25 (×5): 100 mg via ORAL
  Filled 2015-03-20 (×5): qty 1

## 2015-03-20 MED ORDER — HYDROCHLOROTHIAZIDE 12.5 MG PO CAPS
12.5000 mg | ORAL_CAPSULE | Freq: Every day | ORAL | Status: DC
  Administered 2015-03-20 – 2015-03-25 (×5): 12.5 mg via ORAL
  Filled 2015-03-20 (×6): qty 1

## 2015-03-20 MED ORDER — ALBUMIN HUMAN 5 % IV SOLN
INTRAVENOUS | Status: DC | PRN
  Administered 2015-03-20: 17:00:00 via INTRAVENOUS

## 2015-03-20 MED ORDER — ONDANSETRON HCL 4 MG/2ML IJ SOLN
INTRAMUSCULAR | Status: DC | PRN
  Administered 2015-03-20: 4 mg via INTRAVENOUS

## 2015-03-20 MED ORDER — MIDAZOLAM HCL 2 MG/2ML IJ SOLN
INTRAMUSCULAR | Status: AC
  Filled 2015-03-20: qty 2

## 2015-03-20 MED ORDER — ACETAMINOPHEN 650 MG RE SUPP: 650.0000 mg | RECTAL | Status: DC | PRN

## 2015-03-20 MED ORDER — DIAZEPAM 5 MG PO TABS
ORAL_TABLET | ORAL | Status: AC
  Filled 2015-03-20: qty 1

## 2015-03-20 MED ORDER — SODIUM CHLORIDE 0.9 % IV SOLN
INTRAVENOUS | Status: DC
  Administered 2015-03-20 – 2015-03-22 (×3): via INTRAVENOUS

## 2015-03-20 MED ORDER — SODIUM CHLORIDE 0.9 % IJ SOLN: 3.0000 mL | INTRAMUSCULAR | Status: DC | PRN

## 2015-03-20 MED ORDER — FUROSEMIDE 20 MG PO TABS: 20.0000 mg | ORAL_TABLET | ORAL | Status: DC | PRN

## 2015-03-20 MED ORDER — ONDANSETRON HCL 4 MG/2ML IJ SOLN
INTRAMUSCULAR | Status: AC
  Filled 2015-03-20: qty 2

## 2015-03-20 MED ORDER — MIDAZOLAM HCL 5 MG/5ML IJ SOLN
INTRAMUSCULAR | Status: DC | PRN
  Administered 2015-03-20: 2 mg via INTRAVENOUS

## 2015-03-20 MED ORDER — BISACODYL 10 MG RE SUPP: 10.0000 mg | Freq: Every day | RECTAL | Status: DC | PRN

## 2015-03-20 MED ORDER — PROMETHAZINE HCL 25 MG/ML IJ SOLN
6.2500 mg | INTRAMUSCULAR | Status: DC | PRN
Start: 2015-03-20 — End: 2015-03-20

## 2015-03-20 MED ORDER — DOCUSATE SODIUM 100 MG PO CAPS
100.0000 mg | ORAL_CAPSULE | Freq: Two times a day (BID) | ORAL | Status: DC
Start: 2015-03-20 — End: 2015-03-25
  Administered 2015-03-20 – 2015-03-25 (×9): 100 mg via ORAL
  Filled 2015-03-20 (×10): qty 1

## 2015-03-20 MED ORDER — SODIUM CHLORIDE 0.9 % IR SOLN
Status: DC | PRN
  Administered 2015-03-20: 12:00:00

## 2015-03-20 MED ORDER — BUPIVACAINE HCL (PF) 0.5 % IJ SOLN
INTRAMUSCULAR | Status: DC | PRN
  Administered 2015-03-20: 10 mL

## 2015-03-20 MED ORDER — PHENYLEPHRINE 40 MCG/ML (10ML) SYRINGE FOR IV PUSH (FOR BLOOD PRESSURE SUPPORT)
PREFILLED_SYRINGE | INTRAVENOUS | Status: AC
  Filled 2015-03-20: qty 10

## 2015-03-20 MED ORDER — HYDROMORPHONE HCL 1 MG/ML IJ SOLN
0.5000 mg | INTRAMUSCULAR | Status: DC | PRN
  Administered 2015-03-20 – 2015-03-23 (×11): 1 mg via INTRAVENOUS
  Filled 2015-03-20 (×13): qty 1

## 2015-03-20 MED ORDER — SENNA 8.6 MG PO TABS
1.0000 | ORAL_TABLET | Freq: Two times a day (BID) | ORAL | Status: DC
  Administered 2015-03-20 – 2015-03-25 (×7): 8.6 mg via ORAL
  Filled 2015-03-20 (×9): qty 1

## 2015-03-20 MED ORDER — DEXAMETHASONE SODIUM PHOSPHATE 10 MG/ML IJ SOLN
INTRAMUSCULAR | Status: DC | PRN
  Administered 2015-03-20: 10 mg via INTRAVENOUS

## 2015-03-20 MED ORDER — PHENOL 1.4 % MT LIQD
1.0000 | OROMUCOSAL | Status: DC | PRN
  Administered 2015-03-20 – 2015-03-24 (×2): 1 via OROMUCOSAL
  Filled 2015-03-20 (×2): qty 177

## 2015-03-20 MED ORDER — ROCURONIUM BROMIDE 100 MG/10ML IV SOLN
INTRAVENOUS | Status: DC | PRN
  Administered 2015-03-20: 50 mg via INTRAVENOUS
  Administered 2015-03-20 (×2): 10 mg via INTRAVENOUS

## 2015-03-20 MED ORDER — PRIMIDONE 50 MG PO TABS
25.0000 mg | ORAL_TABLET | Freq: Every day | ORAL | Status: DC
  Administered 2015-03-20 – 2015-03-24 (×5): 25 mg via ORAL
  Filled 2015-03-20 (×7): qty 0.5

## 2015-03-20 MED ORDER — MAGNESIUM CITRATE PO SOLN
1.0000 | Freq: Every day | ORAL | Status: DC
  Administered 2015-03-20: 1 via ORAL
  Filled 2015-03-20 (×2): qty 296

## 2015-03-20 MED ORDER — BACLOFEN 10 MG PO TABS
10.0000 mg | ORAL_TABLET | Freq: Three times a day (TID) | ORAL | Status: DC | PRN
  Administered 2015-03-20 – 2015-03-23 (×2): 10 mg via ORAL
  Filled 2015-03-20 (×2): qty 1

## 2015-03-20 MED ORDER — SODIUM CHLORIDE 0.9 % IJ SOLN
3.0000 mL | Freq: Two times a day (BID) | INTRAMUSCULAR | Status: DC
  Administered 2015-03-20 – 2015-03-25 (×6): 3 mL via INTRAVENOUS

## 2015-03-20 MED ORDER — EPHEDRINE SULFATE 50 MG/ML IJ SOLN
INTRAMUSCULAR | Status: DC | PRN
  Administered 2015-03-20: 25 mg via INTRAVENOUS

## 2015-03-20 MED ORDER — PHENYLEPHRINE HCL 10 MG/ML IJ SOLN
INTRAMUSCULAR | Status: DC | PRN
  Administered 2015-03-20: 80 ug via INTRAVENOUS
  Administered 2015-03-20: 120 ug via INTRAVENOUS

## 2015-03-20 MED ORDER — PROPOFOL 10 MG/ML IV BOLUS
INTRAVENOUS | Status: DC | PRN
  Administered 2015-03-20: 150 mg via INTRAVENOUS

## 2015-03-20 MED ORDER — LACTATED RINGERS IV SOLN
INTRAVENOUS | Status: DC | PRN
  Administered 2015-03-20 (×3): via INTRAVENOUS

## 2015-03-20 MED ORDER — 0.9 % SODIUM CHLORIDE (POUR BTL) OPTIME
TOPICAL | Status: DC | PRN
  Administered 2015-03-20: 1000 mL

## 2015-03-20 MED ORDER — ONDANSETRON HCL 4 MG/2ML IJ SOLN
4.0000 mg | INTRAMUSCULAR | Status: DC | PRN
Start: 2015-03-20 — End: 2015-03-25

## 2015-03-20 MED ORDER — THROMBIN 20000 UNITS EX SOLR
CUTANEOUS | Status: DC | PRN
  Administered 2015-03-20: 12:00:00 via TOPICAL

## 2015-03-20 MED ORDER — ATORVASTATIN CALCIUM 10 MG PO TABS
20.0000 mg | ORAL_TABLET | Freq: Every day | ORAL | Status: DC
  Administered 2015-03-21 – 2015-03-25 (×5): 20 mg via ORAL
  Filled 2015-03-20 (×5): qty 2

## 2015-03-20 MED ORDER — FENTANYL CITRATE (PF) 100 MCG/2ML IJ SOLN
25.0000 ug | INTRAMUSCULAR | Status: DC | PRN
  Administered 2015-03-20 (×3): 50 ug via INTRAVENOUS

## 2015-03-20 MED ORDER — FENTANYL CITRATE (PF) 100 MCG/2ML IJ SOLN
INTRAMUSCULAR | Status: AC
  Administered 2015-03-20: 50 ug via INTRAVENOUS
  Filled 2015-03-20: qty 2

## 2015-03-20 MED ORDER — VANCOMYCIN HCL IN DEXTROSE 750-5 MG/150ML-% IV SOLN
750.0000 mg | Freq: Two times a day (BID) | INTRAVENOUS | Status: DC
  Administered 2015-03-21 – 2015-03-23 (×6): 750 mg via INTRAVENOUS
  Filled 2015-03-20 (×7): qty 150

## 2015-03-20 MED ORDER — MENTHOL 3 MG MT LOZG: 1.0000 | LOZENGE | OROMUCOSAL | Status: DC | PRN

## 2015-03-20 MED ORDER — POLYETHYLENE GLYCOL 3350 17 G PO PACK: 17.0000 g | PACK | Freq: Every day | ORAL | Status: DC | PRN

## 2015-03-20 MED ORDER — SODIUM CHLORIDE 0.9 % IV SOLN
250.0000 mL | INTRAVENOUS | Status: DC
  Administered 2015-03-20: 250 mL via INTRAVENOUS

## 2015-03-20 MED ORDER — GLYCOPYRROLATE 0.2 MG/ML IJ SOLN
INTRAMUSCULAR | Status: AC
  Filled 2015-03-20: qty 3

## 2015-03-20 MED ORDER — LEVOTHYROXINE SODIUM 50 MCG PO TABS
75.0000 ug | ORAL_TABLET | Freq: Every day | ORAL | Status: DC
  Administered 2015-03-21 – 2015-03-25 (×5): 75 ug via ORAL
  Filled 2015-03-20 (×5): qty 1

## 2015-03-20 MED ORDER — LACTATED RINGERS IV SOLN
INTRAVENOUS | Status: DC
  Administered 2015-03-20: 11:00:00 via INTRAVENOUS

## 2015-03-20 MED ORDER — ACETAMINOPHEN 325 MG PO TABS: 650.0000 mg | ORAL_TABLET | ORAL | Status: DC | PRN

## 2015-03-20 MED ORDER — DIAZEPAM 5 MG PO TABS
5.0000 mg | ORAL_TABLET | Freq: Four times a day (QID) | ORAL | Status: DC | PRN
  Administered 2015-03-20 – 2015-03-24 (×8): 5 mg via ORAL
  Filled 2015-03-20 (×7): qty 1

## 2015-03-20 MED ORDER — IRBESARTAN 300 MG PO TABS
300.0000 mg | ORAL_TABLET | Freq: Every day | ORAL | Status: DC
  Administered 2015-03-20 – 2015-03-25 (×5): 300 mg via ORAL
  Filled 2015-03-20 (×6): qty 1

## 2015-03-20 MED ORDER — LORAZEPAM 1 MG PO TABS
1.0000 mg | ORAL_TABLET | Freq: Three times a day (TID) | ORAL | Status: DC | PRN
  Administered 2015-03-20 – 2015-03-24 (×3): 1 mg via ORAL
  Filled 2015-03-20 (×3): qty 1

## 2015-03-20 MED ORDER — ROCURONIUM BROMIDE 50 MG/5ML IV SOLN
INTRAVENOUS | Status: AC
  Filled 2015-03-20: qty 1

## 2015-03-20 MED ORDER — VALSARTAN-HYDROCHLOROTHIAZIDE 320-12.5 MG PO TABS: 1.0000 | ORAL_TABLET | Freq: Every day | ORAL | Status: DC

## 2015-03-20 MED ORDER — PANTOPRAZOLE SODIUM 40 MG IV SOLR
40.0000 mg | Freq: Every day | INTRAVENOUS | Status: DC
  Administered 2015-03-20 – 2015-03-22 (×3): 40 mg via INTRAVENOUS
  Filled 2015-03-20 (×3): qty 40

## 2015-03-20 MED ORDER — METOCLOPRAMIDE HCL 5 MG/ML IJ SOLN
INTRAMUSCULAR | Status: DC | PRN
  Administered 2015-03-20: 10 mg via INTRAVENOUS

## 2015-03-20 SURGICAL SUPPLY — 88 items
ADH SKN CLS APL DERMABOND .7 (GAUZE/BANDAGES/DRESSINGS) ×1
APL SKNCLS STERI-STRIP NONHPOA (GAUZE/BANDAGES/DRESSINGS)
BAG DECANTER FOR FLEXI CONT (MISCELLANEOUS) ×3 IMPLANT
BENZOIN TINCTURE PRP APPL 2/3 (GAUZE/BANDAGES/DRESSINGS) IMPLANT
BIT DRILL 3.5 POWEREASE (BIT) ×1 IMPLANT
BIT DRILL 3.5MM POWEREASE (BIT) ×1
BLADE CLIPPER SURG (BLADE) IMPLANT
BLADE SURG 11 STRL SS (BLADE) ×3 IMPLANT
BUR MATCHSTICK NEURO 3.0 LAGG (BURR) ×3 IMPLANT
BUR PRECISION FLUTE 5.0 (BURR) ×3 IMPLANT
CANISTER SUCT 3000ML PPV (MISCELLANEOUS) ×3 IMPLANT
CLOSURE WOUND 1/2 X4 (GAUZE/BANDAGES/DRESSINGS) ×1
CONT SPEC 4OZ CLIKSEAL STRL BL (MISCELLANEOUS) ×3 IMPLANT
COVER BACK TABLE 60X90IN (DRAPES) ×3 IMPLANT
DECANTER SPIKE VIAL GLASS SM (MISCELLANEOUS) ×3 IMPLANT
DERMABOND ADVANCED (GAUZE/BANDAGES/DRESSINGS) ×2
DERMABOND ADVANCED .7 DNX12 (GAUZE/BANDAGES/DRESSINGS) IMPLANT
DEVICE INTERBODY ELEVATE 23X7 (Cage) ×4 IMPLANT
DEVICE INTERBODY ELEVATE 9X23 (Cage) ×4 IMPLANT
DRAPE C-ARM 42X72 X-RAY (DRAPES) ×3 IMPLANT
DRAPE C-ARMOR (DRAPES) ×3 IMPLANT
DRAPE LAPAROTOMY 100X72X124 (DRAPES) ×3 IMPLANT
DRAPE POUCH INSTRU U-SHP 10X18 (DRAPES) ×3 IMPLANT
DRAPE SURG 17X23 STRL (DRAPES) ×3 IMPLANT
DRSG OPSITE 4X5.5 SM (GAUZE/BANDAGES/DRESSINGS) ×2 IMPLANT
DRSG OPSITE POSTOP 4X6 (GAUZE/BANDAGES/DRESSINGS) ×2 IMPLANT
DURAPREP 26ML APPLICATOR (WOUND CARE) ×3 IMPLANT
ELECT BLADE 4.0 EZ CLEAN MEGAD (MISCELLANEOUS) ×3
ELECT REM PT RETURN 9FT ADLT (ELECTROSURGICAL) ×3
ELECTRODE BLDE 4.0 EZ CLN MEGD (MISCELLANEOUS) IMPLANT
ELECTRODE REM PT RTRN 9FT ADLT (ELECTROSURGICAL) ×1 IMPLANT
EVACUATOR 1/8 PVC DRAIN (DRAIN) ×2 IMPLANT
GAUZE SPONGE 4X4 12PLY STRL (GAUZE/BANDAGES/DRESSINGS) IMPLANT
GAUZE SPONGE 4X4 16PLY XRAY LF (GAUZE/BANDAGES/DRESSINGS) ×2 IMPLANT
GLOVE BIO SURGEON STRL SZ 6.5 (GLOVE) ×2 IMPLANT
GLOVE BIO SURGEON STRL SZ7 (GLOVE) ×6 IMPLANT
GLOVE BIO SURGEONS STRL SZ 6.5 (GLOVE) ×2
GLOVE BIOGEL PI IND STRL 6.5 (GLOVE) IMPLANT
GLOVE BIOGEL PI IND STRL 7.5 (GLOVE) ×1 IMPLANT
GLOVE BIOGEL PI INDICATOR 6.5 (GLOVE) ×2
GLOVE BIOGEL PI INDICATOR 7.5 (GLOVE) ×10
GLOVE ECLIPSE 7.0 STRL STRAW (GLOVE) ×5 IMPLANT
GLOVE EXAM NITRILE LRG STRL (GLOVE) IMPLANT
GLOVE EXAM NITRILE MD LF STRL (GLOVE) IMPLANT
GLOVE EXAM NITRILE XL STR (GLOVE) IMPLANT
GLOVE EXAM NITRILE XS STR PU (GLOVE) IMPLANT
GLOVE INDICATOR 7.5 STRL GRN (GLOVE) IMPLANT
GLOVE SS BIOGEL STRL SZ 7 (GLOVE) IMPLANT
GLOVE SUPERSENSE BIOGEL SZ 7 (GLOVE) ×2
GOWN STRL REUS W/ TWL LRG LVL3 (GOWN DISPOSABLE) ×2 IMPLANT
GOWN STRL REUS W/ TWL XL LVL3 (GOWN DISPOSABLE) IMPLANT
GOWN STRL REUS W/TWL 2XL LVL3 (GOWN DISPOSABLE) IMPLANT
GOWN STRL REUS W/TWL LRG LVL3 (GOWN DISPOSABLE) ×18
GOWN STRL REUS W/TWL XL LVL3 (GOWN DISPOSABLE)
HEMOSTAT POWDER KIT SURGIFOAM (HEMOSTASIS) ×3 IMPLANT
KIT BASIN OR (CUSTOM PROCEDURE TRAY) ×3 IMPLANT
KIT INFUSE SMALL (Orthopedic Implant) ×2 IMPLANT
KIT ROOM TURNOVER OR (KITS) ×3 IMPLANT
MILL MEDIUM DISP (BLADE) ×3 IMPLANT
NDL HYPO 18GX1.5 BLUNT FILL (NEEDLE) IMPLANT
NDL HYPO 25X1 1.5 SAFETY (NEEDLE) ×1 IMPLANT
NDL SPNL 18GX3.5 QUINCKE PK (NEEDLE) IMPLANT
NEEDLE HYPO 18GX1.5 BLUNT FILL (NEEDLE) IMPLANT
NEEDLE HYPO 25X1 1.5 SAFETY (NEEDLE) ×3 IMPLANT
NEEDLE SPNL 18GX3.5 QUINCKE PK (NEEDLE) ×3 IMPLANT
NS IRRIG 1000ML POUR BTL (IV SOLUTION) ×3 IMPLANT
PACK LAMINECTOMY NEURO (CUSTOM PROCEDURE TRAY) ×3 IMPLANT
PAD ARMBOARD 7.5X6 YLW CONV (MISCELLANEOUS) ×13 IMPLANT
ROD SOLERA 60MM (Rod) ×4 IMPLANT
SCREW 5.5X35MM (Screw) ×18 IMPLANT
SCREW BN 35X5.5XMA NS SPNE (Screw) IMPLANT
SCREW SET SOLERA (Screw) ×18 IMPLANT
SCREW SET SOLERA TI (Screw) IMPLANT
SPONGE LAP 4X18 X RAY DECT (DISPOSABLE) ×2 IMPLANT
SPONGE SURGIFOAM ABS GEL 100 (HEMOSTASIS) ×3 IMPLANT
STRIP BIOACTIVE VITOSS 25X100X (Neuro Prosthesis/Implant) ×2 IMPLANT
STRIP CLOSURE SKIN 1/2X4 (GAUZE/BANDAGES/DRESSINGS) ×1 IMPLANT
SUT VIC AB 0 CT1 18XCR BRD8 (SUTURE) ×1 IMPLANT
SUT VIC AB 0 CT1 8-18 (SUTURE) ×6
SUT VIC AB 2-0 CT1 18 (SUTURE) ×3 IMPLANT
SUT VIC AB 3-0 FS2 27 (SUTURE) IMPLANT
SUT VICRYL 3-0 RB1 18 ABS (SUTURE) ×5 IMPLANT
SYR 3ML LL SCALE MARK (SYRINGE) IMPLANT
TOWEL OR 17X24 6PK STRL BLUE (TOWEL DISPOSABLE) ×3 IMPLANT
TOWEL OR 17X26 10 PK STRL BLUE (TOWEL DISPOSABLE) ×3 IMPLANT
TRAP SPECIMEN MUCOUS 40CC (MISCELLANEOUS) ×3 IMPLANT
TRAY FOLEY W/METER SILVER 14FR (SET/KITS/TRAYS/PACK) ×3 IMPLANT
WATER STERILE IRR 1000ML POUR (IV SOLUTION) ×3 IMPLANT

## 2015-03-20 NOTE — Progress Notes (Signed)
Patient's radial heart rate at 105 and temp at 99.2. Patient reports that she has been around her grandson who was sick. Patient reports that she has had a cough that she is coughing up some intermittent yellow sputum. Reports very slight increase in shortness of breath. Denies fever or chills. Dr. Gifford Shave notified.

## 2015-03-20 NOTE — Progress Notes (Signed)
Patient admitted from PACU  Via stretcher honeycomb dressing to back clean dry and intact. Drain in place foley cath patent draining yellow urine.

## 2015-03-20 NOTE — Progress Notes (Signed)
ANTIBIOTIC CONSULT NOTE - INITIAL  Pharmacy Consult for vancomycin Indication: surgical prophylaxis  Allergies  Allergen Reactions  . Codeine Nausea And Vomiting  . Hydrocodone Itching    Can tolerate with benadryl  . Hydromorphone Itching  . Morphine And Related Itching  . Penicillins Other (See Comments)    Childhood reaction Has patient had a PCN reaction causing immediate rash, facial/tongue/throat swelling, SOB or lightheadedness with hypotension: No Has patient had a PCN reaction causing severe rash involving mucus membranes or skin necrosis: No Has patient had a PCN reaction that required hospitalization No Has patient had a PCN reaction occurring within the last 10 years: No If all of the above answers are "NO", then may proceed with Cephalosporin use.     Patient Measurements: Height: 5\' 8"  (172.7 cm) Weight: 248 lb 7 oz (112.691 kg) IBW/kg (Calculated) : 63.9 Adjusted Body Weight:   Vital Signs: Temp: 97.7 F (36.5 C) (12/02 1925) Temp Source: Oral (12/02 0930) BP: 114/65 mmHg (12/02 1915) Pulse Rate: 95 (12/02 1925) Intake/Output from previous day:   Intake/Output from this shift: Total I/O In: -  Out: 350 [Urine:350]  Labs: No results for input(s): WBC, HGB, PLT, LABCREA, CREATININE in the last 72 hours. Estimated Creatinine Clearance: 63.3 mL/min (by C-G formula based on Cr of 1.12). No results for input(s): VANCOTROUGH, VANCOPEAK, VANCORANDOM, GENTTROUGH, GENTPEAK, GENTRANDOM, TOBRATROUGH, TOBRAPEAK, TOBRARND, AMIKACINPEAK, AMIKACINTROU, AMIKACIN in the last 72 hours.   Microbiology: Recent Results (from the past 720 hour(s))  Surgical pcr screen     Status: None   Collection Time: 03/16/15  2:11 PM  Result Value Ref Range Status   MRSA, PCR NEGATIVE NEGATIVE Final   Staphylococcus aureus NEGATIVE NEGATIVE Final    Comment:        The Xpert SA Assay (FDA approved for NASAL specimens in patients over 34 years of age), is one component of a  comprehensive surveillance program.  Test performance has been validated by [facility] for patients greater than or equal to 44 year old. It is not intended to diagnose infection nor to guide or monitor treatment.     Medical History: Past Medical History  Diagnosis Date  . Hypertension   . Arthritis   . Hemorrhoids   . PONV (postoperative nausea and vomiting)   . Hypothyroidism     Medications:  Scheduled:  . allopurinol  100 mg Oral Daily  . atorvastatin  20 mg Oral Daily  . diazepam      . docusate sodium  100 mg Oral BID  . levothyroxine  75 mcg Oral Daily  . magnesium citrate  1 Bottle Oral QHS  . oxyCODONE-acetaminophen      . pantoprazole (PROTONIX) IV  40 mg Intravenous QHS  . potassium chloride  10 mEq Oral Daily  . primidone  25 mg Oral QHS  . senna  1 tablet Oral BID  . sodium chloride  3 mL Intravenous Q12H  . valsartan-hydrochlorothiazide  1 tablet Oral Daily   Infusions:  . sodium chloride    . sodium chloride     Assessment: 69 yo female s/p neurosurgery will be put on vancomycin for surgical prophylaxis.  Patient received vancomycin 1500 mg iv x1 at 1250 today.  CrCl ~63.  Per RN, patient does have a drain.  Goal of Therapy:  Vancomycin trough level 10-15 mcg/ml  Plan:  - vancomycin 750 mg iv q12h, 1st dose at 0100 tomorrow - monitor renal function - check vancomycin trough when it's appropriate  Stormi Vandevelde, Tsz-Yin 03/20/2015,8:02 PM

## 2015-03-20 NOTE — H&P (Signed)
CC:  No chief complaint on file.   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 She had previously undergone a course of physical therapy which was ineffective in reducing her pain. She has also tried multiple previous medications, and is currently taking hydrocodone which provides modest relief. At our last meeting I also sent her for epidural steroid injections. She has received a course of 2 injections, but says that they did not really provide any relief. She says the second may have helped with her legs for a day or 2, but by the time the soreness from the injection wore off, she felt exactly the same as she did before the injections.   PMH: Past Medical History  Diagnosis Date  . Hypertension   . Arthritis   . Hemorrhoids   . PONV (postoperative nausea and vomiting)   . Hypothyroidism     PSH: Past Surgical History  Procedure Laterality Date  . Basal cell carcinoma excision  10/15    nose  . Abdominal hysterectomy  1985  . Breast reduction surgery  1998  . Cervical spine surgery  1995    hallo  . Knee arthroscopy Right 2002  . Colectomy  2005    right side  . Hemorrhoid surgery  2008  . Tonsillectomy    . Tubal ligation    . Hand surgery Left   . Eye surgery Left 07/2014    cataract removed 3 months later    SH: Social History  Substance Use Topics  . Smoking status: Former Smoker -- 1.00 packs/day for 35 years    Types: Cigarettes    Quit date: 03/15/2010  . Smokeless tobacco: None  . Alcohol Use: 0.6 oz/week    1 Glasses of wine per week    MEDS: Prior to Admission medications   Medication Sig Start Date End Date Taking? Authorizing Provider  allopurinol (ZYLOPRIM) 100 MG tablet Take 1 tablet by mouth daily. 01/15/13  Yes Historical Provider, MD  atorvastatin (LIPITOR) 20 MG tablet Take 1 tablet by mouth daily.  02/13/13  Yes Historical Provider, MD  baclofen (LIORESAL) 10 MG tablet Take 10 mg by mouth as needed for muscle spasms.  10/11/13  Yes Historical Provider, MD  diphenhydramine-acetaminophen (TYLENOL PM) 25-500 MG TABS tablet Take 2 tablets by mouth at bedtime as needed.   Yes Historical Provider, MD  HYDROcodone-acetaminophen (NORCO) 10-325 MG tablet Take 1 tablet by mouth every 6 (six) hours as needed. for pain. Take with Benadryl. 02/18/15  Yes Historical Provider, MD  levothyroxine (SYNTHROID, LEVOTHROID) 75 MCG tablet Take 75 mcg by mouth daily. 01/09/15  Yes Historical Provider, MD  LORazepam (ATIVAN) 1 MG tablet Take 1 tablet by mouth every 8 (eight) hours as needed for anxiety.  02/22/13  Yes Historical Provider, MD  MAGNESIUM CITRATE PO Take 2 tablets by mouth at bedtime. Using for constipation   Yes Historical Provider, MD  polyethylene glycol (MIRALAX) packet Take 17 g by mouth at bedtime as needed.   Yes Historical Provider, MD  primidone (MYSOLINE) 50 MG tablet Take 0.5 tablets (25 mg total) by mouth at bedtime. Patient taking differently: Take 50 mg by mouth at bedtime.  11/14/13  Yes Drema Dallas, DO  valsartan-hydrochlorothiazide (DIOVAN-HCT) 320-12.5 MG per tablet Take 1 tablet by mouth daily. 03/04/13  Yes Historical Provider, MD  furosemide (LASIX) 20 MG tablet Take  20 mg by mouth as needed for fluid.  09/11/13   Historical Provider, MD  KLOR-CON 10 10 MEQ tablet Take 10 mEq by mouth as needed. 09/11/13   Historical Provider, MD    ALLERGY: Allergies  Allergen Reactions  . Codeine Nausea And Vomiting  . Hydrocodone Itching    Can tolerate with benadryl  . Hydromorphone Itching  . Morphine And Related Itching  . Penicillins Other (See Comments)    Childhood reaction Has patient had a PCN reaction causing immediate rash, facial/tongue/throat swelling, SOB or lightheadedness with hypotension: No Has patient had a PCN reaction causing severe rash involving mucus membranes or skin  necrosis: No Has patient had a PCN reaction that required hospitalization No Has patient had a PCN reaction occurring within the last 10 years: No If all of the above answers are "NO", then may proceed with Cephalosporin use.     ROS: ROS  NEUROLOGIC EXAM: Awake, alert, oriented Memory and concentration grossly intact Speech fluent, appropriate CN grossly intact Motor exam: Upper Extremities Deltoid Bicep Tricep Grip  Right 5/5 5/5 5/5 5/5  Left 5/5 5/5 5/5 5/5   Lower Extremity IP Quad PF DF EHL  Right 5/5 5/5 5/5 5/5 5/5  Left 5/5 5/5 5/5 5/5 5/5   Sensation grossly intact to LT  IMGAING: MRI of the lumbar spine was reviewed. This demonstrates normal alignment. At L3 L4 there is bilateral facet arthropathy, with a left foraminal zone disc fragment. At L4 L5, there is broad-based disc bulge, with bilateral facet arthropathy, and a small right-sided synovial cyst which leads to moderate central stenosis, and moderately severe bilateral lateral recess stenosis. At L5-S1 there is significant disc degeneration and loss of height, with endplate change. There is a superiorly migrated disc fragment on the right, as well as a broad-based component along with bilateral facet arthropathy which causes central stenosis and severe bilateral lateral recess stenosis.  IMPRESSION: 69 year old woman with history of chronic back pain with progression over the last few months. Her pain is likely related to spondylitic change with multifactorial stenosis at L4 L5 and at L5-S1.  PLAN: surgical decompression and fusion at L4 L5 and L5-S1.   I explained to the patient the details of the procedure, as well as the risks which include but are not limited to nerve root injury leading to leg or foot weakness and or bowel and bladder dysfunction, CSF leak, bleeding, and infection. Possible outcomes of surgery were also discussed including the possibility of uncomplicated surgery but persistence of pain  symptoms and the possiblity of accelerated adjacent level degeneration. The general risks of anesthesia were also reviewed including heart attack, stroke, and DVT/PE.   The patient understood our discussion and is willing to proceed with surgical decompression and fusion.

## 2015-03-20 NOTE — Anesthesia Preprocedure Evaluation (Addendum)
Anesthesia Evaluation  Patient identified by MRN, date of birth, ID band Patient awake    Reviewed: Allergy & Precautions, NPO status , Patient's Chart, lab work & pertinent test results  History of Anesthesia Complications (+) PONV and history of anesthetic complications  Airway Mallampati: III  TM Distance: >3 FB Neck ROM: Full    Dental  (+) Teeth Intact, Dental Advisory Given   Pulmonary former smoker,  2 day history of dry cough.  Patient denies fevers, chills, productive sputum.     Pulmonary exam normal breath sounds clear to auscultation       Cardiovascular Exercise Tolerance: Good hypertension, Pt. on medications (-) angina(-) CAD and (-) Past MI Normal cardiovascular exam Rhythm:Regular Rate:Normal     Neuro/Psych LE weakness, tingling negative psych ROS   GI/Hepatic negative GI ROS, Neg liver ROS,   Endo/Other  Hypothyroidism Morbid obesityObesity   Renal/GU negative Renal ROS     Musculoskeletal  (+) Arthritis , Osteoarthritis,    Abdominal   Peds  Hematology negative hematology ROS (+)   Anesthesia Other Findings Day of surgery medications reviewed with the patient.  Reproductive/Obstetrics                           Anesthesia Physical Anesthesia Plan  ASA: III  Anesthesia Plan: General   Post-op Pain Management:    Induction: Intravenous  Airway Management Planned: Oral ETT  Additional Equipment:   Intra-op Plan:   Post-operative Plan: Extubation in OR  Informed Consent: I have reviewed the patients History and Physical, chart, labs and discussed the procedure including the risks, benefits and alternatives for the proposed anesthesia with the patient or authorized representative who has indicated his/her understanding and acceptance.   Dental advisory given  Plan Discussed with: CRNA, Anesthesiologist and Surgeon  Anesthesia Plan Comments: (Risks/benefits  of general anesthesia discussed with patient including risk of damage to teeth, lips, gum, and tongue, nausea/vomiting, allergic reactions to medications, and the possibility of heart attack, stroke and death.  All patient questions answered.  Patient wishes to proceed.)       Anesthesia Quick Evaluation

## 2015-03-20 NOTE — Transfer of Care (Signed)
Immediate Anesthesia Transfer of Care Note  Patient: Brandi Stephens  Procedure(s) Performed: Procedure(s): LUMBAR FOUR-FIVE,LUMBAR FIVE-SACRAL ONE POSTERIOR LUMBAR INTERBODY FUSION WITH INTERBODY PROSTHESIS,POSTERIOR LATERAL ARTHRODESIS AND POSTERIOR SEGMENTAL INSTRUMENTAION. (N/A)  Patient Location: PACU  Anesthesia Type:General  Level of Consciousness: awake  Airway & Oxygen Therapy: Patient Spontanous Breathing and Patient connected to nasal cannula oxygen  Post-op Assessment: Report given to RN and Post -op Vital signs reviewed and stable  Post vital signs: Reviewed and stable  Last Vitals:  Filed Vitals:   03/20/15 1745 03/20/15 1800  BP: 115/77 145/73  Pulse: 99 99  Temp:    Resp: 21 20    Complications: No apparent anesthesia complications

## 2015-03-20 NOTE — Anesthesia Procedure Notes (Signed)
Procedure Name: Intubation Date/Time: 03/20/2015 12:38 PM Performed by: Carney Living Pre-anesthesia Checklist: Patient identified, Emergency Drugs available, Suction available, Patient being monitored and Timeout performed Patient Re-evaluated:Patient Re-evaluated prior to inductionOxygen Delivery Method: Circle system utilized Preoxygenation: Pre-oxygenation with 100% oxygen Intubation Type: IV induction Ventilation: Mask ventilation without difficulty and Oral airway inserted - appropriate to patient size Laryngoscope Size: Mac and 4 Grade View: Grade II Tube type: Oral Tube size: 7.5 mm Number of attempts: 1 Airway Equipment and Method: Stylet Placement Confirmation: ETT inserted through vocal cords under direct vision,  positive ETCO2 and breath sounds checked- equal and bilateral Secured at: 22 cm Tube secured with: Tape Dental Injury: Teeth and Oropharynx as per pre-operative assessment

## 2015-03-20 NOTE — Op Note (Signed)
PREOP DIAGNOSIS:  1. Lumbar stenosis 2. Lumbar spondylosis 3. Lumbago with radiculopathy  POSTOP DIAGNOSIS: Same  PROCEDURE: 1. L4 laminectomy with facetectomy for decompression of exiting nerve roots, more than would be required for placement of interbody graft 2. L5 laminectomy with facetectomy for decompression of exiting nerve roots, more than would be required for placement of interbody graft 3. Placement of anterior interbody device - Medtronic Rise expandable cage, 54mm @ L4-5, 42mm @ L5-S1 4. Posterior instrumentation using cortical pedicle screws at L4, L5, S1, Medtronic Solera 5.5 x 92mm (x6) 5. Interbody arthrodesis, L4-5, L5-S1 6. Use of locally harvested bone autograft 7. Use of non-structural bone allograft 8. Use of BMP  SURGEON: Dr. [name], MD  ASSISTANT: Dr. Cyndy Freeze, MD  ANESTHESIA: General Endotracheal  EBL: 500cc  SPECIMENS: None  DRAINS: None  COMPLICATIONS: None immediate  CONDITION: Hemodynamically stable to PACU  HISTORY: Brandi Stephens is a 69 y.o. female initially seen in the outpatient neurosurgery clinic with back and leg pain. Her MRI demonstrated primarily foraminal stenosis at L4-5 and L5-S1, with significant disc degeneration and loss of height at L5-S1. She attempted a reasonable course of conservative treatment including physical therapy, pain medication, and epidural steroid injections with continued worsening of her pain. She therefore elected to proceed with surgical decompression and fusion. The risks and benefits of surgery were explained in detail to the patient and her husband. After all questions were answered, informed consent was obtained and witnessed.  PROCEDURE IN DETAIL: After informed consent was obtained and witnessed, the patient was brought to the operating room. After induction of general anesthesia, the patient was positioned on the operative table in the prone position. All pressure points were meticulously  padded. Skin incision was then marked out a prepped and draped in usual sterile fashion.   After timeout was conducted, skin was infiltrated with local anesthetic. Skin incision was then made sharply and Bovie electrocautery was used to dissect the subcutaneous tissue until the lumbodorsal fascia was identified and incised. The muscle was then elevated in the subperiosteal plane. Self-retaining retractors were then placed after exposure of the L4, L5, and superior S1 lamina out to the pars interarticularis.   Using intraoperative fluoroscopy, entry points for bilateral L4 cortical pedicle screws were identified. Pilot holes were then created under lateral fluoroscopy and tapped to 35 mm.  At this point attention was turned to decompression. Complete L4 and L5 laminectomy with complete facetectomy was completed using a combination of Kerrison rongeurs and a high-speed drill. The exiting L4, L5, and traversing S1 nerve roots were all identified and completely decompressed. The L4-5 disc space was then identified, epidural venous plexus was coagulated, and the disc space was incised bilaterally. Using a combination of curettes, rongeurs, and shavers, complete discectomy was completed. Endplates were then prepared using curettes. 9 mm expandable cages were then selected. BMP was then placed into the interspace, the interspace was then packed with combination of morcellized bone autograft harvested during the decompression portion of the procedure, mixed with Vitoss. The 9 mm graft was then tapped into place, and expanded to approximately 11-12 mm. In a similar fashion, BMP was placed on the contralateral side, more bone graft was placed in the interspace, and the 9 mm graft was inserted, and expanded to the same height. Good position was confirmed with lateral fluoroscopy.  At this point, attention was turned to placement of interbody device at L5-S1. Initially, the L5-S1 interspace was identified by incising  the annulus  and posterior longitudinal ligament. The disc space was found to be significantly collapsed, and the use of an osteotome was needed to initially enter the disc space, break the posterior osteophytes, and expanded the disc space. Once this was completed, complete discectomy was effected using a combination of curettes and rongeurs. Endplates were then prepared using rongeurs. 7 mm expandable cages were then selected. The disc space was then packed with a combination of morcellized bone autograft as well as Riley Lam. It was also packed with BMP. 7 mm expansile cages were then tapped into place, and expanded to approximately 9-10 mm. Position was confirmed with fluoroscopy.  Having completed interbody arthrodesis and placement of anterior instrumentation, attention was turned to placement of the pedicle screws. The drill was used to create pilot holes which were tapped to 5.5 x 35 mm at L5 and S1. 5.5 x 35 mm screws were then placed at L4, L5, and S1. 60 mm rod was then selected with pre-bent lordosis, and setscrews were placed. Setscrews are then final tightened. Position of the hardware was confirmed using AP and lateral fluoroscopy.   The wound is then irrigated with copious amounts of bacitracinsaline irrigation.Hemovac drain was then placed and tunneled subcutaneously. The wound is then closed in layers using a combination of interrupted 0 and 3-0 Vicryl stitches. A layered Dermabond was applied. Sterile dressing was then applied. The patient was then transferred the stretcher, extubated, and taken to the postanesthesia care unit in stable hemodynamic condition. At the end of the case all sponge, needle, instrument, and cottonoid counts were correct.

## 2015-03-21 ENCOUNTER — Encounter (HOSPITAL_COMMUNITY): Payer: Self-pay | Admitting: *Deleted

## 2015-03-21 LAB — CBC
HEMATOCRIT: 35.1 % — AB (ref 36.0–46.0)
Hemoglobin: 11.1 g/dL — ABNORMAL LOW (ref 12.0–15.0)
MCH: 32 pg (ref 26.0–34.0)
MCHC: 31.6 g/dL (ref 30.0–36.0)
MCV: 101.2 fL — AB (ref 78.0–100.0)
Platelets: 200 10*3/uL (ref 150–400)
RBC: 3.47 MIL/uL — AB (ref 3.87–5.11)
RDW: 15.1 % (ref 11.5–15.5)
WBC: 13 10*3/uL — AB (ref 4.0–10.5)

## 2015-03-21 LAB — BASIC METABOLIC PANEL
ANION GAP: 8 (ref 5–15)
BUN: 15 mg/dL (ref 6–20)
CHLORIDE: 105 mmol/L (ref 101–111)
CO2: 26 mmol/L (ref 22–32)
Calcium: 8.1 mg/dL — ABNORMAL LOW (ref 8.9–10.3)
Creatinine, Ser: 1 mg/dL (ref 0.44–1.00)
GFR calc Af Amer: >60 mL/min (ref >60)
GFR calc non Af Amer: 57 mL/min — ABNORMAL LOW (ref >60)
GLUCOSE: 191 mg/dL — AB (ref 65–99)
POTASSIUM: 4 mmol/L (ref 3.5–5.1)
Sodium: 139 mmol/L (ref 135–145)

## 2015-03-21 NOTE — Evaluation (Addendum)
Occupational Therapy Evaluation Patient Details Name: Brandi Stephens MRN: [id number] DOB: October 06, 1945 Today's Date: 03/21/2015    History of Present Illness 69 y.o. female s/p PLIF L4-5, L5-S1.   Clinical Impression   Pt s/p above. Pt independent with ADLs, PTA. Feel pt will benefit from acute OT to increase independence prior to d/c. Recommending HHOT at this time.    Follow Up Recommendations  Home health OT;Supervision/Assistance - 24 hour    Equipment Recommendations  Other (comment) Adaptive equipment   Recommendations for Other Services       Precautions / Restrictions Precautions Precautions: Back;Fall Precaution Booklet Issued: No Required Braces or Orthoses: Spinal Brace Spinal Brace: Lumbar corset;Applied in sitting position Restrictions Weight Bearing Restrictions: No     Mobility Bed Mobility Overal bed mobility: Needs Assistance Bed Mobility: Rolling;Sidelying to Sit;Sit to Sidelying Rolling: Supervision Sidelying to sit: Min assist     Sit to sidelying: Supervision General bed mobility comments: cues given. Assist with trunk.   Transfers Overall transfer level: Needs assistance Equipment used: Rolling walker (2 wheeled) Transfers: Sit to/from Stand Sit to Stand: Min guard         General transfer comment: cue for hand placement    Balance      Used RW for sit to stand and side stepping. Pt able to assist with functional task while standing without LOB.                                      ADL Overall ADL's : Needs assistance/impaired Eating/Feeding: Modified independent;Sitting               Upper Body Dressing : Moderate assistance;Sitting   Lower Body Dressing: Maximal assistance;Sit to/from stand   Toilet Transfer: Min guard;Ambulation;RW (took a couple side steps)   Toileting- Water quality scientist and Hygiene: Moderate assistance;Sit to/from stand       Functional mobility during ADLs: Min  guard;Rolling walker (couple side steps) General ADL Comments: assisted pt with back brace and educated.      Vision     Perception     Praxis      Pertinent Vitals/Pain Pain Assessment: 0-10 Pain Score: 9  Pain Location: back towards end of session; reported breast pain Pain Descriptors / Indicators: Shooting (in breast area) Pain Intervention(s): Monitored during session;Repositioned     Hand Dominance     Extremity/Trunk Assessment Upper Extremity Assessment Upper Extremity Assessment: RUE deficits/detail RUE Deficits / Details: noted tremors in RUE when drinking water   Lower Extremity Assessment Lower Extremity Assessment: Defer to PT evaluation       Communication Communication Communication: No difficulties   Cognition Arousal/Alertness: Awake/alert Behavior During Therapy: WFL for tasks assessed/performed;Anxious Overall Cognitive Status: Within Functional Limits for tasks assessed                     General Comments       Exercises       Shoulder Instructions      Home Living Family/patient expects to be discharged to:: Private residence Living Arrangements: Spouse/significant other Available Help at Discharge: Family;Available 24 hours/day Type of Home: House Home Access: Stairs to enter [employer] of Steps: 3 Entrance Stairs-Rails: Right;Left Home Layout: Two level;Bed/bath upstairs Alternate Level Stairs-Number of Steps: flight Alternate Level Stairs-Rails: Right Bathroom Shower/Tub: Teacher, early years/pre: Standard     Home Equipment: Environmental consultant - 2  wheels;Cane - single point;Bedside commode;Shower seat          Prior Functioning/Environment Level of Independence: Independent             OT Diagnosis: Acute pain   OT Problem List: Pain;Decreased knowledge of precautions;Decreased knowledge of use of DME or AE;Decreased activity tolerance;Decreased range of motion   OT Treatment/Interventions:  Self-care/ADL training;DME and/or AE instruction;Therapeutic activities;Patient/family education;Balance training    OT Goals(Current goals can be found in the care plan section) Acute Rehab OT Goals Patient Stated Goal: not stated OT Goal Formulation: With patient Time For Goal Achievement: 03/28/15 Potential to Achieve Goals: Good ADL Goals Pt Will Perform Lower Body Bathing: with set-up;with supervision;with adaptive equipment;sit to/from stand Pt Will Perform Lower Body Dressing: with supervision;with set-up;with adaptive equipment;sit to/from stand Pt Will Transfer to Toilet: with supervision;ambulating (3 in 1 over commode) Pt Will Perform Toileting - Clothing Manipulation and hygiene: with set-up;sit to/from stand;with supervision;with adaptive equipment Additional ADL Goal #1: Pt will independently verbalize 3/3 back precautions and maintain during session.  OT Frequency: Min 2X/week   Barriers to D/C:            Co-evaluation              End of Session Equipment Utilized During Treatment: Rolling walker;Back brace;Oxygen Nurse Communication: Other (comment) (IV been beeping; notified tech that pt needed cleaning)  Activity Tolerance: Patient limited by pain (anxious about loose stool) Patient left: in bed;with call bell/phone within reach;with bed alarm set   Time: 1201-1219 OT Time Calculation (min): 18 min Charges:  OT General Charges $OT Visit: 1 Procedure OT Evaluation $Initial OT Evaluation Tier I: 1 Procedure G-CodesBenito Mccreedy OTR/L [id number] 03/21/2015, 12:44 PM

## 2015-03-21 NOTE — Anesthesia Postprocedure Evaluation (Signed)
Anesthesia Post Note  Patient: Brandi Stephens  Procedure(s) Performed: Procedure(s) (LRB): LUMBAR FOUR-FIVE,LUMBAR FIVE-SACRAL ONE POSTERIOR LUMBAR INTERBODY FUSION WITH INTERBODY PROSTHESIS,POSTERIOR LATERAL ARTHRODESIS AND POSTERIOR SEGMENTAL INSTRUMENTAION. (N/A)  Patient location during evaluation: PACU Anesthesia Type: General Level of consciousness: sedated Pain management: pain level controlled Vital Signs Assessment: post-procedure vital signs reviewed and stable Respiratory status: spontaneous breathing and respiratory function stable Cardiovascular status: stable Anesthetic complications: no    Last Vitals:  Filed Vitals:   03/21/15 0135 03/21/15 0533  BP: 105/60 105/71  Pulse: 72 95  Temp: 36.9 C 36.8 C  Resp: 16 17    Last Pain:  Filed Vitals:   03/21/15 0602  PainSc: 4                  Melba Araki DANIEL

## 2015-03-21 NOTE — Clinical Social Work Note (Signed)
CSW received referral for SNF.  PT and OT recommending home health.  CSW to sign off please re-consult if social work needs arise.  Jones Broom. Wilder, MSW, [place]

## 2015-03-21 NOTE — Progress Notes (Signed)
Orthopedic Tech Progress Note Patient Details:  Brandi Stephens Dec 30, 1945 [id number]  Patient ID: Brandi Stephens, female   DOB: November 06, 1945, 69 y.o.   MRN: [id number]   [name] 03/21/2015, 9:19 [facility] [employer] for lumbar brace.

## 2015-03-21 NOTE — Evaluation (Signed)
Physical Therapy Evaluation Patient Details Name: Brandi Stephens MRN: [id number] DOB: 1946/02/02 Today's Date: 03/21/2015   History of Present Illness  69 y.o. female s/p PLIF.  Clinical Impression  Patient is s/p above surgery resulting in the deficits listed below (see PT Problem List). On eval, pt required min assist for all functional mobility. Gait distance limited by pain. Patient will benefit from skilled PT to increase their independence and safety with mobility (while adhering to their precautions) to allow discharge to the venue listed below. Pt's bedroom and full bathroom are upstairs in her house. She will need to manage a flight of stairs prior to d/c.     Follow Up Recommendations Home health PT;Supervision/Assistance - 24 hour    Equipment Recommendations  None recommended by PT    Recommendations for Other Services       Precautions / Restrictions Precautions Precautions: Back;Fall Precaution Comments: Pt educated on 3/3 back precautions. Required Braces or Orthoses: Spinal Brace Spinal Brace: Lumbar corset;Applied in sitting position Restrictions Other Position/Activity Restrictions: Brace had not arrived at time of PT eval. MD on floor and verbally approved OOB without brace for eval and sitting in chair.      Mobility  Bed Mobility Overal bed mobility: Needs Assistance Bed Mobility: Sidelying to Sit;Rolling Rolling: Modified independent (Device/Increase time) Sidelying to sit: Min assist       General bed mobility comments: verbal cues for logroll. increased time to complete.  Transfers Overall transfer level: Needs assistance Equipment used: Rolling walker (2 wheeled) Transfers: Sit to/from Stand Sit to Stand: Min assist         General transfer comment: verbal cues for hand placement. Assist to power up.  Ambulation/Gait Ambulation/Gait assistance: Min assist Ambulation Distance (Feet): 15 Feet (x 2) Assistive device: Rolling walker (2  wheeled) Gait Pattern/deviations: Step-through pattern;Decreased stride length;Trunk flexed Gait velocity: decreased   General Gait Details: Verbal cues for posture and RW placement. Knee buckling x 1 during gait with min assist to recover.  Stairs            Wheelchair Mobility    Modified Rankin (Stroke Patients Only)       Balance                                             Pertinent Vitals/Pain Pain Assessment: 0-10 Pain Score: 8  Pain Location: low back with mobility Pain Descriptors / Indicators: Aching;Burning;Guarding;Grimacing Pain Intervention(s): Monitored during session;Limited activity within patient's tolerance;Premedicated before session;Repositioned    Home Living Family/patient expects to be discharged to:: Private residence Living Arrangements: Spouse/significant other Available Help at Discharge: Family;Available 24 hours/day Type of Home: House Home Access: Stairs to enter Entrance Stairs-Rails: Psychiatric nurse of Steps: 3 Home Layout: Two level;Bed/bath upstairs Home Equipment: Walker - 2 wheels;Cane - single point;Bedside commode      Prior Function Level of Independence: Independent               Hand Dominance        Extremity/Trunk Assessment                         Communication   Communication: No difficulties  Cognition Arousal/Alertness: Awake/alert Behavior During Therapy: WFL for tasks assessed/performed Overall Cognitive Status: Within Functional Limits for tasks assessed  General Comments      Exercises        Assessment/Plan    PT Assessment Patient needs continued PT services  PT Diagnosis Difficulty walking;Acute pain   PT Problem List Decreased strength;Decreased activity tolerance;Decreased balance;Decreased mobility;Pain;Decreased knowledge of precautions;Decreased knowledge of use of DME  PT Treatment Interventions DME  instruction;Gait training;Stair training;Functional mobility training;Therapeutic activities;Therapeutic exercise;Patient/family education;Balance training   PT Goals (Current goals can be found in the Care Plan section) Acute Rehab PT Goals Patient Stated Goal: home PT Goal Formulation: With patient Time For Goal Achievement: 04/04/15 Potential to Achieve Goals: Good    Frequency Min 5X/week   Barriers to discharge        Co-evaluation               End of Session Equipment Utilized During Treatment: Gait belt Activity Tolerance: Patient limited by pain Patient left: in chair;with call bell/phone within reach Nurse Communication: Mobility status         Time: 0927-1000 PT Time Calculation (min) (ACUTE ONLY): 33 min   Charges:   PT Evaluation $Initial PT Evaluation Tier I: 1 Procedure PT Treatments $Gait Training: 8-22 mins   PT G Codes:        Lorriane Shire 03/21/2015, 10:10 AM

## 2015-03-21 NOTE — Progress Notes (Signed)
Filed Vitals:   03/20/15 1925 03/20/15 2009 03/21/15 0135 03/21/15 0533  BP:  117/89 105/60 105/71  Pulse: 95 95 72 95  Temp: 97.7 F (36.5 C) 98.1 F (36.7 C) 98.4 F (36.9 C) 98.2 F (36.8 C)  TempSrc:  Oral Oral Oral  Resp: 15 16 16 17   Height:  5\' 8"  (1.727 m)    Weight:  115.2 kg (253 lb 15.5 oz)    SpO2: 95% 96% 95% 96%    CBC  Recent Labs  03/21/15 0505  WBC 13.0*  HGB 11.1*  HCT 35.1*  PLT 200   BMET  Recent Labs  03/21/15 0505  NA 139  K 4.0  CL 105  CO2 26  GLUCOSE 191*  BUN 15  CREATININE 1.00  CALCIUM 8.1*    Patient resting in bed fairly comfortable now, but not given any pain medication overnight, and quite uncomfortable initially this morning. 140 mL of output into Hemovac drain, we'll plan on having nursing staff remove that tomorrow. Dressing is clean and dry. Has not yet been out of bed. No brace at bedside, nursing staff to request brace from brace company. Encourage patient to ambulate progressively increasing distances in the halls. Foley DC'd this morning, and patient has already voided.  Plan: Continue to progress through postoperative recovery.  Hosie Spangle, MD 03/21/2015, 8:51 AM

## 2015-03-22 NOTE — Progress Notes (Signed)
Patient medicated for pain q4hrs. Up to bedside commode X4 thus far.

## 2015-03-22 NOTE — Progress Notes (Signed)
Physical Therapy Treatment Patient Details Name: Brandi Stephens MRN: [id number] DOB: [birth year] Today's Date: 03/22/2015    History of Present Illness 69 y.o. female s/p PLIF L4-5, L5-S1.    PT Comments    Pt progressing with mobility as she was able to increase amb distance.  Cont with current POC to maximize functional mobility prior to d/c home   Follow Up Recommendations  Home health PT;Supervision/Assistance - 24 hour     Equipment Recommendations  None recommended by PT    Recommendations for Other Services       Precautions / Restrictions Precautions Precautions: Back;Fall Precaution Booklet Issued: No Required Braces or Orthoses: Spinal Brace Spinal Brace: Lumbar corset;Applied in sitting position Restrictions Weight Bearing Restrictions: No    Mobility  Bed Mobility Overal bed mobility: Needs Assistance Bed Mobility: Rolling;Sidelying to Sit;Sit to Sidelying Rolling: Supervision Sidelying to sit: Supervision     Sit to sidelying: Supervision General bed mobility comments: cues to reinforce back precautions  Transfers Overall transfer level: Needs assistance Equipment used: Rolling walker (2 wheeled) Transfers: Sit to/from Stand Sit to Stand: Min guard         General transfer comment: cues for hand placement.    Ambulation/Gait Ambulation/Gait assistance: Min guard Ambulation Distance (Feet): 40 Feet Assistive device: Rolling walker (2 wheeled) Gait Pattern/deviations: Step-through pattern Gait velocity: decreased   General Gait Details: slow & guarded gait but steady.     Stairs            Wheelchair Mobility    Modified Rankin (Stroke Patients Only)       Balance                                    Cognition Arousal/Alertness: Awake/alert Behavior During Therapy: WFL for tasks assessed/performed;Anxious Overall Cognitive Status: Within Functional Limits for tasks assessed                       Exercises      General Comments        Pertinent Vitals/Pain Pain Assessment: 0-10 Pain Score: 7  Pain Location: back Pain Descriptors / Indicators: Aching Pain Intervention(s): Monitored during session;Premedicated before session;Repositioned    Home Living                      Prior Function            PT Goals (current goals can now be found in the care plan section) Acute Rehab PT Goals Patient Stated Goal: not stated PT Goal Formulation: With patient Time For Goal Achievement: 04/04/15 Potential to Achieve Goals: Good Progress towards PT goals: Progressing toward goals    Frequency  Min 5X/week    PT Plan Current plan remains appropriate    Co-evaluation             End of Session Equipment Utilized During Treatment: Back brace Activity Tolerance: Patient tolerated treatment well Patient left: in bed;with call bell/phone within reach     Time: 0926-0950 PT Time Calculation (min) (ACUTE ONLY): 24 min  Charges:  $Gait Training: 8-22 mins $Therapeutic Activity: 8-22 mins                    G Codes:      Sena Hitch 03/22/2015, 12:26 PM   Sarajane Marek, PTA [phone] 03/22/2015

## 2015-03-22 NOTE — Progress Notes (Addendum)
Occupational Therapy Treatment Patient Details Name: Brandi Stephens MRN: [id number] DOB: November 17, 1945 Today's Date: 03/22/2015    History of present illness 69 y.o. female s/p PLIF L4-5, L5-S1.   OT comments  Pt progressing but remains anxious. Education provided to pt and her daughter in session.   Follow Up Recommendations  Home health OT;Supervision/Assistance - 24 hour    Equipment Recommendations  Other (comment) (Adaptive Equipment)    Recommendations for Other Services      Precautions / Restrictions Precautions Precautions: Back;Fall Precaution Booklet Issued: No Precaution Comments: reviewed back precautions Required Braces or Orthoses: Spinal Brace Spinal Brace: Lumbar corset (already on in session; doffed in sitting) Restrictions Weight Bearing Restrictions: No       Mobility Bed Mobility Overal bed mobility: Needs Assistance Bed Mobility: Rolling;Sit to Sidelying Rolling: Supervision       Sit to sidelying: Supervision General bed mobility comments: cues for technique.  Trendelenburg position used to scoot HOB  Transfers Overall transfer level: Needs assistance Equipment used: Rolling walker (2 wheeled) Transfers: Sit to/from [employer] Sit to Stand: Min guard Stand pivot transfers: Min guard            Balance    No LOB in session.                               ADL Overall ADL's : Needs assistance/impaired Eating/Feeding: Independent;Bed level Eating/Feeding Details (indicate cue type and reason): drinking smoothie Grooming:  (tech brushed and fixed pt's hair)           Upper Body Dressing : Minimal assistance;Sitting Upper Body Dressing Details (indicate cue type and reason): back brace Lower Body Dressing: Minimal assistance;With adaptive equipment;Sitting/lateral leans Lower Body Dressing Details (indicate cue type and reason): pt able to doff both socks and don one sock with sockaid with OT positioning  sockaid for pt. Wanted OT to don other sock Toilet Transfer: Min guard;RW;Stand-pivot;BSC   Toileting- Water quality scientist and Hygiene: Sit to/from stand (Total assist for hygiene; pt able to manage gown in front with Min guard)       Functional mobility during ADLs: Min guard;Rolling walker (stand pivot) General ADL Comments: Educated on AE and pt practiced with reacher and sockaid. Educated on back brace. Discussed incorporating precautions into functional activities.  Discussed SNF vs HH as daughter is concerned about pt going home. Educated on toilet aide and what pt could use for this at home.      Vision                     Perception     Praxis      Cognition  Awake/Alert Behavior During Therapy: [facility] for tasks assessed/performed;Anxious Overall Cognitive Status: Within Functional Limits for tasks assessed                       Extremity/Trunk Assessment               Exercises     Shoulder Instructions       General Comments      Pertinent Vitals/ Pain       Pain Assessment: 0-10 Pain Score: 10-Worst pain ever Pain Location: back and also reported pain in head as tech was fixing hair Pain Descriptors / Indicators: Sore; grimacing Pain Intervention(s): Monitored during session;Repositioned   HR up around 115 in session. O2 dropping in 80s in session,  but unsure of accuracy.  Home Living                                          Prior Functioning/Environment              Frequency Min 2X/week     Progress Toward Goals  OT Goals(current goals can now be found in the care plan section)  Progress towards OT goals: Progressing toward goals  Acute Rehab OT Goals Patient Stated Goal: go home OT Goal Formulation: With patient Time For Goal Achievement: 03/28/15 Potential to Achieve Goals: Good ADL Goals Pt Will Perform Lower Body Bathing: with set-up;with supervision;with adaptive equipment;sit to/from  stand Pt Will Perform Lower Body Dressing: with supervision;with set-up;with adaptive equipment;sit to/from stand Pt Will Transfer to Toilet: with supervision;ambulating (3 in 1 over commode) Pt Will Perform Toileting - Clothing Manipulation and hygiene: with set-up;sit to/from stand;with supervision;with adaptive equipment Additional ADL Goal #1: Pt will independently verbalize 3/3 back precautions and maintain during session.  Plan Discharge plan remains appropriate    Co-evaluation                 End of Session Equipment Utilized During Treatment: Oxygen;Rolling walker;Back brace   Activity Tolerance Patient limited by pain (anxious)   Patient Left in bed;with family/visitor present;with SCD's reapplied   Nurse Communication          Time: [id number]  OT Time Calculation (min): 22 min  Charges: OT General Charges $OT Visit: 1 Procedure OT Treatments $Self Care/Home Management : 8-22 mins  Benito Mccreedy OTR/L [id number] 03/22/2015, 4:27 PM

## 2015-03-22 NOTE — Progress Notes (Signed)
Patient still requiring IV pain medication Requests to stay one more night for pain control No acute events AVSS Full strength Incision c/d/i Stable Expect d/c tomorrow Pain control Encourage ambulation

## 2015-03-23 ENCOUNTER — Inpatient Hospital Stay (HOSPITAL_COMMUNITY): Payer: Commercial Managed Care - HMO

## 2015-03-23 LAB — BASIC METABOLIC PANEL
Anion gap: 5 (ref 5–15)
BUN: 12 mg/dL (ref 6–20)
CALCIUM: 8.7 mg/dL — AB (ref 8.9–10.3)
CO2: 31 mmol/L (ref 22–32)
Chloride: 103 mmol/L (ref 101–111)
Creatinine, Ser: 0.81 mg/dL (ref 0.44–1.00)
GFR calc Af Amer: >60 mL/min (ref >60)
GLUCOSE: 145 mg/dL — AB (ref 65–99)
Potassium: 4.3 mmol/L (ref 3.5–5.1)
Sodium: 139 mmol/L (ref 135–145)

## 2015-03-23 LAB — CBC
HCT: 34.7 % — ABNORMAL LOW (ref 36.0–46.0)
HCT: 31.2 % — ABNORMAL LOW (ref 36.0–46.0)
HEMOGLOBIN: 9.9 g/dL — AB (ref 12.0–15.0)
Hemoglobin: 11.1 g/dL — ABNORMAL LOW (ref 12.0–15.0)
MCH: 33 pg (ref 26.0–34.0)
MCH: 32.6 pg (ref 26.0–34.0)
MCHC: 32 g/dL (ref 30.0–36.0)
MCHC: 31.7 g/dL (ref 30.0–36.0)
MCV: 103.3 fL — AB (ref 78.0–100.0)
MCV: 102.6 fL — ABNORMAL HIGH (ref 78.0–100.0)
PLATELETS: 201 10*3/uL (ref 150–400)
PLATELETS: 185 10*3/uL (ref 150–400)
RBC: 3.36 MIL/uL — ABNORMAL LOW (ref 3.87–5.11)
RBC: 3.04 MIL/uL — ABNORMAL LOW (ref 3.87–5.11)
RDW: 15.4 % (ref 11.5–15.5)
RDW: 15.2 % (ref 11.5–15.5)
WBC: 9.5 10*3/uL (ref 4.0–10.5)
WBC: 9.4 10*3/uL (ref 4.0–10.5)

## 2015-03-23 MED ORDER — GUAIFENESIN 100 MG/5ML PO SOLN
5.0000 mL | ORAL | Status: DC | PRN
  Administered 2015-03-23 – 2015-03-24 (×3): 100 mg via ORAL
  Filled 2015-03-23 (×2): qty 10
  Filled 2015-03-23: qty 100

## 2015-03-23 MED ORDER — PANTOPRAZOLE SODIUM 40 MG PO TBEC
40.0000 mg | DELAYED_RELEASE_TABLET | Freq: Every day | ORAL | Status: DC
  Administered 2015-03-23 – 2015-03-25 (×3): 40 mg via ORAL
  Filled 2015-03-23 (×3): qty 1

## 2015-03-23 MED ORDER — LEVOFLOXACIN 750 MG PO TABS
750.0000 mg | ORAL_TABLET | Freq: Every day | ORAL | Status: DC
  Administered 2015-03-23 – 2015-03-25 (×3): 750 mg via ORAL
  Filled 2015-03-23 (×3): qty 1

## 2015-03-23 NOTE — Progress Notes (Signed)
Occupational Therapy Treatment Patient Details Name: Brandi Stephens MRN: [id number] DOB: 1946/01/14 Today's Date: 03/23/2015    History of present illness 69 y.o. female s/p PLIF L4-5, L5-S1. Post-op having stress incontinence and decr respiratory status   OT comments  Pt with poor progress. Pt not able to recall or generalize back precautions, pain remains increased requiring IV medication for management and continues to be a high fall risk.  Pt does not have the availability of physical assistance at home due to her husband of advanced age also with back issues. Pt noted to desaturate to 87% on RA with short ambulation to bathroom and sink.  Recommended pt get up to chair for all meals and continue ambulation to bathroom with nursing staff.  Pt needs short term rehab at a SNF prior to return home. Updated recommendations.  Will continue to follow.  Follow Up Recommendations  SNF;Supervision/Assistance - 24 hour    Equipment Recommendations       Recommendations for Other Services      Precautions / Restrictions Precautions Precautions: Back;Fall Precaution Booklet Issued: No Precaution Comments: pt able to recall 2 of 3  Required Braces or Orthoses: Spinal Brace Spinal Brace: Lumbar corset;Applied in sitting position Restrictions Weight Bearing Restrictions: No       Mobility Bed Mobility Overal bed mobility: Needs Assistance Bed Mobility: Rolling;Sidelying to Sit;Sit to Sidelying Rolling: Min guard (with rail) Sidelying to sit: Min assist;HOB elevated     Sit to sidelying: Min guard;HOB elevated General bed mobility comments: cues for technique. Assist due to weakness and to maintain back precautions  Transfers Overall transfer level: Needs assistance Equipment used: Rolling walker (2 wheeled) Transfers: Sit to/from Stand Sit to Stand: Min guard         General transfer comment: cues for safe use of RW; cues to avoid twisting      Balance                                    ADL Overall ADL's : Needs assistance/impaired     Grooming: Wash/dry hands;Min guard;Standing           Upper Body Dressing : Minimal assistance;Sitting Upper Body Dressing Details (indicate cue type and reason): back brace     Toilet Transfer: Min guard;RW;BSC;Ambulation   Toileting- Clothing Manipulation and Hygiene: Minimal assistance;Sit to/from stand Toileting - Clothing Manipulation Details (indicate cue type and reason): cues to avoid twisting, educated in use of toilet tongs     Functional mobility during ADLs: Min guard;Rolling walker (ambulated to bathroom and sink) General ADL Comments: Educated pt and daughter in IADL adhering to back precautions.      Vision                     Perception     Praxis      Cognition   Behavior During Therapy: [facility] for tasks assessed/performed Overall Cognitive Status: Impaired/Different from baseline Area of Impairment: Memory;Following commands;Awareness;Problem solving     Memory: Decreased recall of precautions;Decreased short-term memory  Following Commands: Follows one step commands with increased time   Awareness: Intellectual Problem Solving: Slow processing;Requires verbal cues General Comments: poor generalization of back precautions, poor recall of previous session's education    Extremity/Trunk Assessment               Exercises     Shoulder Instructions  General Comments      Pertinent Vitals/ Pain       Pain Assessment: 0-10 Pain Score: 8  Pain Location: back Pain Descriptors / Indicators: Sore Pain Intervention(s): Limited activity within patient's tolerance;Monitored during session;Premedicated before session;Repositioned  Home Living Family/patient expects to be discharged to:: Private residence Living Arrangements: Spouse/significant other Available Help at Discharge: Family;Available 24 hours/day (husband cannot physically assist) Type of  Home: House Home Access: Stairs to enter [employer] of Steps: 6 (6 front; 4 back) Entrance Stairs-Rails: Right;Left (front rails; back no rails) Home Layout: Two level;Bed/bath upstairs Alternate Level Stairs-Number of Steps: flight Alternate Level Stairs-Rails: Right (1/2 way up rail is on the left only) Bathroom Shower/Tub: Tub/shower unit;Curtain   Biochemist, clinical: Standard     Home Equipment: Environmental consultant - 2 wheels;Bedside commode          Prior Functioning/Environment Level of Independence: Independent            Frequency Min 2X/week     Progress Toward Goals  OT Goals(current goals can now be found in the care plan section)  Progress towards OT goals: Not progressing toward goals - comment (pt desaturating on RA)  Acute Rehab OT Goals Patient Stated Goal: be able to care for herself including go upstairs to her bedroom OT Goal Formulation: With patient/family Time For Goal Achievement: 03/28/15 Potential to Achieve Goals: Good  Plan Discharge plan needs to be updated    Co-evaluation                 End of Session     Activity Tolerance Patient limited by pain   Patient Left in bed;with call bell/phone within reach;with bed alarm set;with family/visitor present (pt declining SCDs)   Nurse Communication          Time: 1033-1100 OT Time Calculation (min): 27 min  Charges: OT General Charges $OT Visit: 1 Procedure OT Treatments $Self Care/Home Management : 23-37 mins  Malka So 03/23/2015, 11:23 AM  [phone]

## 2015-03-23 NOTE — Progress Notes (Signed)
No issues overnight. Pt has continued back soreness. Ambulating with PT/OT. C/O congestion, cough, and sore throat. Has been on  supplemental O2.  EXAM:  BP 109/57 mmHg  Pulse 100  Temp(Src) 98.9 F (37.2 C) (Oral)  Resp 20  Ht 5\' 8"  (1.727 m)  Wt 115.2 kg (253 lb 15.5 oz)  BMI 38.62 kg/m2  SpO2 97%  Awake, alert, oriented  Speech fluent, appropriate  CN grossly intact  5/5 BUE/BLE   IMPRESSION:  69 y.o. female POD# 3 s/p L4-S1 fusion, progressing slowly. Husband also has severe back pain, unable to help care for her at home. No other family readily available. - febrile, has had cough  PLAN: - Will consult CMS for possible SNF placement - Cont mobilizing with PT/OT - will get CXR, CBC. May then need to start abx. - Will give anti-tussive

## 2015-03-23 NOTE — Progress Notes (Signed)
Pt continues to have cough, with decreased SpO2, and CXR today demonstrating patchy LLL consolidation. Will treat her empirically with levaquin 750mg  daily x 7days.

## 2015-03-23 NOTE — Progress Notes (Signed)
Pt oob to br with assist, on ra, no obvious distress. Returned to bed, shasllow breathing present, o2 sat on ra=75%. Placed on 3 l/[place] sats improved to 92% with rest. Use of inspirometry reinforced, demonstrated.

## 2015-03-23 NOTE — Progress Notes (Signed)
Physical Therapy Treatment Patient Details Name: Brandi Stephens MRN: [id number] DOB: [birth year] Today's Date: 03/23/2015    History of Present Illness 69 y.o. female s/p PLIF L4-5, L5-S1. Post-op having stress incontinence and decr respiratory status    PT Comments    Daughter present and clarified multiple issues re: concern for pt's safety at home. Updated her current list of DME and home set-up (see below) as information the patient had provided was inaccurate. Patient's bedroom and bathroom are on second floor and her spouse who is to care for her is now using a walker and/or wheelchair due to his severe back pain/weakness and is planning to have back surgery. Also noted pt continuing to use oxygen at rest. Patient desaturated to 86% on room air (attempted to take pt to stairs to practice and unable due to dyspnea and desaturation). Patient unable to recall back precautions and requires frequent cues to maintain proper alignment.  Lengthy discussion with patient, daughter, physician, and later RN and Case Manager re: barriers to discharge home. Encouraged continued ambulation with nursing and monitoring SaO2 with walking.    Follow Up Recommendations  SNF;Supervision for mobility/OOB     Equipment Recommendations  None recommended by PT    Recommendations for Other Services       Precautions / Restrictions Precautions Precautions: Back;Fall Precaution Booklet Issued: No Precaution Comments: reviewed pre- and post-treatment with pt only able to recall 1/3 each time Required Braces or Orthoses: Spinal Brace Spinal Brace: Lumbar corset;Applied in sitting position Restrictions Weight Bearing Restrictions: No    Mobility  Bed Mobility Overal bed mobility: Needs Assistance Bed Mobility: Rolling;Sidelying to Sit Rolling: Min assist (no rail) Sidelying to sit: Min assist;HOB elevated       General bed mobility comments: cues for technique. Assist due to weakness and to  maintain back precautions  Transfers Overall transfer level: Needs assistance Equipment used: Rolling walker (2 wheeled) Transfers: Sit to/from Stand Sit to Stand: Min guard         General transfer comment: cues for safe use of RW; cues to avoid twisting    Ambulation/Gait Ambulation/Gait assistance: Min guard Ambulation Distance (Feet): 180 Feet Assistive device: Rolling walker (2 wheeled) Gait Pattern/deviations: Step-through pattern;Decreased stride length Gait velocity: decreased Gait velocity interpretation: Below normal speed for age/gender General Gait Details: slow & guarded gait but steady with RW; standing rest break x 3 due to dyspnea   Stairs Stairs:  (unable to attempt due to decr SaO2 )          Wheelchair Mobility    Modified Rankin (Stroke Patients Only)       Balance                                    Cognition Arousal/Alertness: Awake/alert Behavior During Therapy: WFL for tasks assessed/performed Overall Cognitive Status: Impaired/Different from baseline (likely due to medicine effects) Area of Impairment: Memory;Following commands;Awareness;Problem solving     Memory: Decreased recall of precautions;Decreased short-term memory Following Commands: Follows one step commands with increased time   Awareness: Intellectual Problem Solving: Slow processing;Requires verbal cues General Comments: frequent vc for maintaining back precautions throughout session; slow to process instructions    Exercises      General Comments        Pertinent Vitals/Pain Pain Assessment: 0-10 Pain Score: 7  Pain Location: back Pain Descriptors / Indicators: Operative site guarding Pain Intervention(s): Limited activity  within patient's tolerance;Monitored during session;Premedicated before session;Repositioned    Home Living   Living Arrangements: Spouse/significant other Available Help at Discharge: Family;Available 24 hours/day (can only  provide supervision; 79 yo spouse needs back surger)     Entrance Stairs-Rails: Right;Left (front rails; back no rails) Home Layout: Two level;Bed/bath upstairs Home Equipment: Walker - 2 wheels;Bedside commode      Prior Function Level of Independence: Independent          PT Goals (current goals can now be found in the care plan section) Acute Rehab PT Goals Patient Stated Goal: be able to care for herself including go upstairs to her bedroom Time For Goal Achievement: 04/04/15 Progress towards PT goals: Progressing toward goals    Frequency  Min 5X/week    PT Plan Discharge plan needs to be updated    Co-evaluation             End of Session Equipment Utilized During Treatment: Back brace;Oxygen Activity Tolerance: Treatment limited secondary to medical complications (Comment) (dyspnea and decr SaO2) Patient left: with call bell/phone within reach;in chair;with family/visitor present     Time: [id number] PT Time Calculation (min) (ACUTE ONLY): 54 min  Charges:  $Gait Training: 23-37 mins $Therapeutic Activity: 8-22 mins                    G Codes:      Brandi Stephens 04/08/2015, 9:57 AM  Pager [phone]

## 2015-03-23 NOTE — Care Management Important Message (Signed)
Important Message  Patient Details  Name: Brandi Stephens MRN: [id number] Date of Birth: 09/13/1945   Medicare Important Message Given:  Yes    Barb Merino [place] 03/23/2015, 11:51 AM

## 2015-03-24 NOTE — Progress Notes (Signed)
Occupational Therapy Treatment Patient Details Name: Brandi Stephens MRN: [id number] DOB: January 26, 1946 Today's Date: 03/24/2015    History of present illness 69 y.o. female s/p PLIF L4-5, L5-S1. Post-op having stress incontinence and decr respiratory status   OT comments  Pt with sudden and severe onset of nausea that limited session today. Pt taken off O2 by RN this morning and O2 sats ranged from 80- 86 during session. Had extensive conversation with pt and daughter about needs and functional progression if pt goes to SNF or if she goes home. Still recommending SNF for short-term rehab as pt is a fall risk and has minimal assistance at home. Will continue to follow acutely to address OT needs and goals.     Follow Up Recommendations  SNF;Supervision/Assistance - 24 hour    Equipment Recommendations  3 in 1 bedside comode    Recommendations for Other Services      Precautions / Restrictions Precautions Precautions: Back;Fall Precaution Comments: Pt able to recall 3/3 back precautions Required Braces or Orthoses: Spinal Brace Spinal Brace: Lumbar corset;Applied in sitting position Restrictions Weight Bearing Restrictions: No       Mobility Bed Mobility Overal bed mobility: Needs Assistance Bed Mobility: Rolling;Sit to Sidelying Rolling: Min guard Sidelying to sit: Min guard     Sit to sidelying: Min guard General bed mobility comments: HOB flat, no bedrails to simulate home environment. Good demonstration of log roll technique  Transfers Overall transfer level: Needs assistance Equipment used: Rolling walker (2 wheeled) Transfers: Sit to/from Stand Sit to Stand: Min guard         General transfer comment: Pt with safe hand placement on seated surface and RW. Min guard for safety due to increased nausea    Balance Overall balance assessment: Needs assistance Sitting-balance support: No upper extremity supported;Feet supported Sitting balance-Leahy Scale: Fair     Standing balance support: Bilateral upper extremity supported;During functional activity Standing balance-Leahy Scale: Poor                     ADL Overall ADL's : Needs assistance/impaired     Grooming: Brushing hair;With caregiver independent assisting;Sitting                   Toilet Transfer: Min guard;Ambulation;BSC;RW   Toileting- Water quality scientist and Hygiene: Min guard;Adhering to back precautions;Sit to/from stand       Functional mobility during ADLs: Min guard;Rolling walker General ADL Comments: Session limited by pt's sudden onset of severe nausea. Pt taken off O2 by RN prior to session and sats staying between 81-88%. Pt doffed brace with min verbal cues and no physical assist. Reviewed AE for LB ADLs and pt's daughter will purchase reacher and toilet aid. Extensive conversation with pt and daughter about needs and progression for SNF vs. [place] services.        Vision                     Perception     Praxis      Cognition   Behavior During Therapy: [facility] for tasks assessed/performed Overall Cognitive Status: Within Functional Limits for tasks assessed                       Extremity/Trunk Assessment               Exercises     Shoulder Instructions       General Comments  Pertinent Vitals/ Pain       Pain Assessment: 0-10 Pain Score: 8  Pain Location: low back Pain Descriptors / Indicators: Aching;Operative site guarding Pain Intervention(s): Limited activity within patient's tolerance;Monitored during session;Repositioned  Home Living                                          Prior Functioning/Environment              Frequency Min 2X/week     Progress Toward Goals  OT Goals(current goals can now be found in the care plan section)  Progress towards OT goals: Progressing toward goals  Acute Rehab OT Goals Patient Stated Goal: to go home OT Goal Formulation: With  patient/family Time For Goal Achievement: 03/28/15 Potential to Achieve Goals: Good ADL Goals Pt Will Perform Lower Body Bathing: with set-up;with supervision;with adaptive equipment;sit to/from stand Pt Will Perform Lower Body Dressing: with supervision;with set-up;with adaptive equipment;sit to/from stand Pt Will Transfer to Toilet: with supervision;ambulating Pt Will Perform Toileting - Clothing Manipulation and hygiene: with set-up;sit to/from stand;with supervision;with adaptive equipment Additional ADL Goal #1: Pt will independently verbalize 3/3 back precautions and maintain during session.  Plan Discharge plan remains appropriate    Co-evaluation                 End of Session Equipment Utilized During Treatment: Rolling walker;Back brace   Activity Tolerance Treatment limited secondary to medical complications (Comment) (severe and sudden nausea)   Patient Left in bed;with call bell/phone within reach;with bed alarm set   Nurse Communication Mobility status;Precautions        Time: [id number] OT Time Calculation (min): 33 min  Charges: OT General Charges $OT Visit: 1 Procedure OT Treatments $Self Care/Home Management : 23-37 mins  Redmond Baseman, OTR/L 03/24/2015, 12:38 PM

## 2015-03-24 NOTE — Clinical Social Work Placement (Signed)
   CLINICAL SOCIAL WORK PLACEMENT  NOTE  Date:  03/24/2015  Patient Details  Name: Brandi Stephens MRN: [id number] Date of Birth: September 28, 1945  Clinical Social Work is seeking post-discharge placement for this patient at the [place] level of care (*CSW will initial, date and re-position this form in  chart as items are completed):  Yes   Patient/family provided with [facility] Work Department's list of facilities offering this level of care within the geographic area requested by the patient (or if unable, by the patient's family).  Yes   Patient/family informed of their freedom to choose among providers that offer the needed level of care, that participate in Medicare, Medicaid or managed care program needed by the patient, have an available bed and are willing to accept the patient.  Yes   Patient/family informed of [facility]'s ownership interest in [facility] and [facility], as well as of the fact that they are under no obligation to receive care at these facilities.  PASRR submitted to EDS on 03/24/15     PASRR number received on 03/24/15     Existing PASRR number confirmed on       FL2 transmitted to all facilities in geographic area requested by pt/family on 03/24/15     FL2 transmitted to all facilities within larger geographic area on       Patient informed that his/her managed care company has contracts with or will negotiate with certain facilities, including the following:            Patient/family informed of bed offers received.  Patient chooses bed at       Physician recommends and patient chooses bed at      Patient to be transferred to   on  .  Patient to be transferred to facility by       Patient family notified on   of transfer.  Name of family member notified:        PHYSICIAN Please sign FL2     Additional Comment:    _______________________________________________ Ross Ludwig, LCSWA 03/24/2015,  6:58 PM

## 2015-03-24 NOTE — Clinical Social Work Note (Signed)
Clinical Social Work Assessment  Patient Details  Name: Brandi Stephens MRN: [id number] Date of Birth: [birth year]  Date of referral:  03/24/15               Reason for consult:  Facility Placement                Permission sought to share information with:  Facility Art therapist granted to share information::  Yes, Verbal Permission Granted  Name::        Agency::  SNF admissions  Relationship::     Contact Information:     Housing/Transportation Living arrangements for the past 2 months:  Single Family Home Source of Information:  Patient Patient Interpreter Needed:  None Criminal Activity/Legal Involvement Pertinent to Current Situation/Hospitalization:  No - Comment as needed Significant Relationships:  Spouse, Adult Children Lives with:  Spouse Do you feel safe going back to the place where you live?  Yes Need for family participation in patient care:  No (Coment)  Care giving concerns:  Patient feel she needs some short term rehab in order to return back home.   Social Worker assessment / plan:  Patient is a pleasant 69 year old female who lives with her husband.  Patient states that she would rather go home with home health, but is in agreement to going to SNF for short term rehab.  Patient expressed that she has never been to rehab before, CSW explained to her what to expect and process for SNF placement.  Patient was asked if she had any other questions, patient stated she did not.  CSW informed her that there is a chance that insurance may not approve her to go to SNF, if they do not approve patient is in agreement to going home with home health.  Employment status:  Retired Nurse, adult PT Recommendations:  [place] / Referral to community resources:  [place]  Patient/Family's Response to care:  Patient in agreement to going to SNF for short term rehab. Patient/Family's  Understanding of and Emotional Response to Diagnosis, Current Treatment, and Prognosis:  Patient aware of current prognosis and treatment plan.  Emotional Assessment Appearance:  Appears stated age Attitude/Demeanor/Rapport:    Affect (typically observed):  Appropriate, Calm, Pleasant Orientation:  Oriented to  Time, Oriented to Place, Oriented to Self, Oriented to Situation Alcohol / Substance use:  Not Applicable Psych involvement (Current and /or in the community):  No (Comment)  Discharge Needs  Concerns to be addressed:  No discharge needs identified Readmission within the last 30 days:  No Current discharge risk:  None Barriers to Discharge:  No Barriers Identified   Ross Ludwig, LCSWA 03/24/2015, 6:40 PM

## 2015-03-24 NOTE — Progress Notes (Signed)
No issues overnight. Pt still has back pain. Improved cough. Still c/o stress incontinence  EXAM:  BP 124/65 mmHg  Pulse 90  Temp(Src) 97.7 F (36.5 C) (Oral)  Resp 18  Ht 5\' 8"  (1.727 m)  Wt 115.2 kg (253 lb 15.5 oz)  BMI 38.62 kg/m2  SpO2 97%  Awake, alert, oriented  Speech fluent, appropriate  CN grossly intact  5/5 BUE/BLE  Wound c/d/i  IMPRESSION:  69 y.o. female POD# 4 PLIF, progressing slowly  PLAN: - Cont Levaquin for pna - Will consult SW for SNF placement

## 2015-03-24 NOTE — NC FL2 (Signed)
[place] LEVEL OF CARE SCREENING TOOL     IDENTIFICATION  Patient Name: Brandi Stephens Birthdate: Mar 01, 1946 Sex: female Admission Date (Current Location): 03/20/2015  [facility] and [place] Number: [employer] and Address:  [facility] [place], [place] [address], [place], [place] [address]      Provider Number: [id number]  Attending Physician Name and Address:  [name], MD  Relative Name and Phone Number:  atlanta janes  Spouse [phone] or [phone]    Current Level of Care: Hospital Recommended Level of Care: [place] Prior Approval Number:    Date Approved/Denied:   PASRR Number: [id number]  Discharge Plan: SNF    Current Diagnoses: Patient Active Problem List   Diagnosis Date Noted  . Lumbar spondylosis 03/20/2015  . Paresthesias 04/01/2013  . Tremor 03/19/2013    Orientation ACTIVITIES/SOCIAL BLADDER RESPIRATION    Self, Time, Situation, Place    Continent O2 (As needed) (3 L)  BEHAVIORAL SYMPTOMS/MOOD NEUROLOGICAL BOWEL NUTRITION STATUS      Continent Diet (Regular)  PHYSICIAN VISITS COMMUNICATION OF NEEDS Height & Weight Skin    Verbally 5\' 8"  (172.7 cm) 253 lbs. Surgical wounds          AMBULATORY STATUS RESPIRATION    Supervision limited O2 (As needed) (3 L)      Personal Care Assistance Level of Assistance  Dressing, Bathing Bathing Assistance: Limited assistance   Dressing Assistance: Limited assistance      Functional Limitations Info                SPECIAL CARE FACTORS FREQUENCY  PT (By licensed PT)     PT Frequency: 5x             Additional Factors Info  Allergies   Allergies Info: CODEINE, HYDROCODONE, HYDROMORPHONE, MORPHINE AND RELATED, PENICILLINS           Current Medications (03/24/2015):  This is the current hospital active medication list Current Facility-Administered Medications  Medication Dose Route Frequency Provider Last Rate Last  Dose  . 0.9 %  sodium chloride infusion   Intravenous Continuous [name], MD 75 mL/hr at 03/22/15 0636    . 0.9 %  sodium chloride infusion  250 mL Intravenous Continuous [name], MD 1 mL/hr at 03/20/15 2000 250 mL at 03/20/15 2000  . acetaminophen (TYLENOL) tablet 650 mg  650 mg Oral Q4H PRN [name], MD       Or  . acetaminophen (TYLENOL) suppository 650 mg  650 mg Rectal Q4H PRN [name], MD      . allopurinol (ZYLOPRIM) tablet 100 mg  100 mg Oral Daily [name], MD   100 mg at 03/24/15 0839  . atorvastatin (LIPITOR) tablet 20 mg  20 mg Oral Daily [name], MD   20 mg at 03/24/15 0839  . baclofen (LIORESAL) tablet 10 mg  10 mg Oral TID PRN [name], MD   10 mg at 03/23/15 0655  . bisacodyl (DULCOLAX) suppository 10 mg  10 mg Rectal Daily PRN [name], MD      . diazepam (VALIUM) tablet 5 mg  5 mg Oral Q6H PRN [name], MD   5 mg at 03/24/15 0853  . docusate sodium (COLACE) capsule 100 mg  100 mg Oral BID [name], MD   100 mg at 03/24/15 0840  . furosemide (LASIX) tablet 20 mg  20 mg Oral PRN [name], MD      . guaiFENesin (  ROBITUSSIN) 100 MG/5ML solution 100 mg  5 mL Oral Q4H PRN [name], MD   100 mg at 03/24/15 1414  . irbesartan (AVAPRO) tablet 300 mg  300 mg Oral Daily [name], MD   300 mg at 03/24/15 [id number]   And  . hydrochlorothiazide (MICROZIDE) capsule 12.5 mg  12.5 mg Oral Daily [name], MD   12.5 mg at 03/24/15 0839  . HYDROmorphone (DILAUDID) injection 0.5-1 mg  0.5-1 mg Intravenous Q2H PRN [name], MD   1 mg at 03/23/15 2242  . levofloxacin (LEVAQUIN) tablet 750 mg  750 mg Oral Daily [name], MD   750 mg at 03/24/15 0840  . levothyroxine (SYNTHROID, LEVOTHROID) tablet 75 mcg  75 mcg Oral Daily [name], MD   75 mcg at 03/24/15 0840  . LORazepam (ATIVAN) tablet 1 mg  1 mg Oral Q8H PRN [name], MD   1 mg at 03/21/15  2013  . magnesium citrate solution 1 Bottle  1 Bottle Oral QHS [name], MD   1 Bottle at 03/20/15 2213  . menthol-cetylpyridinium (CEPACOL) lozenge 3 mg  1 lozenge Oral PRN [name], MD       Or  . phenol (CHLORASEPTIC) mouth spray 1 spray  1 spray Mouth/Throat PRN [name], MD   1 spray at 03/24/15 1414  . ondansetron (ZOFRAN) injection 4 mg  4 mg Intravenous Q4H PRN [name], MD      . oxyCODONE-acetaminophen (PERCOCET/ROXICET) 5-325 MG per tablet 1-2 tablet  1-2 tablet Oral Q4H PRN [name], MD   2 tablet at 03/24/15 1414  . pantoprazole (PROTONIX) EC tablet 40 mg  40 mg Oral Q supper Donalynn Furlong Liberal, RPH   40 mg at 03/24/15 1750  . polyethylene glycol (MIRALAX / GLYCOLAX) packet 17 g  17 g Oral Daily PRN [name], MD      . potassium chloride (K-DUR,KLOR-CON) CR tablet 10 mEq  10 mEq Oral Daily [name], MD   10 mEq at 03/24/15 0853  . primidone (MYSOLINE) tablet 25 mg  25 mg Oral QHS [name], MD   25 mg at 03/23/15 2242  . senna (SENOKOT) tablet 8.6 mg  1 tablet Oral BID [name], MD   8.6 mg at 03/24/15 0839  . sodium chloride 0.9 % injection 3 mL  3 mL Intravenous Q12H [name], MD   3 mL at 03/24/15 1000  . sodium chloride 0.9 % injection 3 mL  3 mL Intravenous PRN [name], MD      . sodium phosphate (FLEET) 7-19 GM/118ML enema 1 enema  1 enema Rectal Once PRN [name], MD         Discharge Medications: Please see discharge summary for a list of discharge medications.  Relevant Imaging Results:  Relevant Lab Results:  Recent Labs    Additional Information Patient's SSN [id number]  Ross Ludwig, LCSWA

## 2015-03-24 NOTE — Progress Notes (Signed)
Physical Therapy Treatment Patient Details Name: Brandi Stephens MRN: [id number] DOB: 12-Mar-1946 Today's Date: 03/24/2015    History of Present Illness 69 y.o. female s/p PLIF L4-5, L5-S1. Post-op having stress incontinence and decr respiratory status    PT Comments    Patient continues with decr awareness of back precautions and requires frequent cues to adhere. Patient continues to require education regarding safe and proper use of DME. She ambulated on 2LO2 with SaO2 89-91% and 3 rest breaks. Several confused statements during session with pt stating, "what am I saying?"  Pt will need stair training for entering home and discussed making arrangements to sleep on first floor (due to current respiratory status).   Follow Up Recommendations  SNF;Supervision for mobility/OOB     Equipment Recommendations  None recommended by PT    Recommendations for Other Services       Precautions / Restrictions Precautions Precautions: Back;Fall Precaution Booklet Issued: No Precaution Comments: pt only able to recall 1/3 back precautions; Patient unable to recall precautions. The patient needs reinforcement to maintain precautions with functional mobility.   Required Braces or Orthoses: Spinal Brace Spinal Brace: Lumbar corset;Applied in sitting position Restrictions Weight Bearing Restrictions: No    Mobility  Bed Mobility Overal bed mobility: Needs Assistance Bed Mobility: Rolling;Sidelying to Sit Rolling: Min guard Sidelying to sit: HOB elevated;Min guard       General bed mobility comments: pt coming to sit with HOB elevated and using bedrail as PT returned to room with O2 tank  Transfers Overall transfer level: Needs assistance Equipment used: Rolling walker (2 wheeled) Transfers: Sit to/from Stand Sit to Stand: Min guard         General transfer comment: x3 (including toilet with BSC); pt consistently attempts to twist to look behind her as approaching surface; vc for  proper use of RW for incr safety  Ambulation/Gait Ambulation/Gait assistance: Min guard Ambulation Distance (Feet): 50 Feet (rest, 100, rest, 15) Assistive device: Rolling walker (2 wheeled) Gait Pattern/deviations: Step-through pattern;Trunk flexed Gait velocity: decreased Gait velocity interpretation: Below normal speed for age/gender General Gait Details: slow & guarded gait but steady with RW; standing rest break x 3 due to dyspnea(even with 2L O2)   Stairs Stairs:  (unable to attempt due to decr SaO2)          Wheelchair Mobility    Modified Rankin (Stroke Patients Only)       Balance                                    Cognition Arousal/Alertness: Awake/alert Behavior During Therapy: WFL for tasks assessed/performed Overall Cognitive Status: Impaired/Different from baseline Area of Impairment: Memory;Following commands;Awareness;Problem solving;Attention;Safety/judgement   Current Attention Level: Selective Memory: Decreased recall of precautions;Decreased short-term memory Following Commands: Follows one step commands consistently Safety/Judgement: Decreased awareness of safety Awareness: Intellectual Problem Solving: Slow processing;Requires verbal cues;Difficulty sequencing General Comments: several instances of not being able to find correct word; frequent vc for maintaining back precautions throughout session; slow to process instructions    Exercises      General Comments        Pertinent Vitals/Pain Pain Assessment: 0-10 Pain Score: 7  Pain Location: back and bil hips Pain Descriptors / Indicators: Operative site guarding;Aching Pain Intervention(s): Limited activity within patient's tolerance;Monitored during session;Premedicated before session;Repositioned    Home Living  Prior Function            PT Goals (current goals can now be found in the care plan section) Acute Rehab PT Goals Patient  Stated Goal: be able to care for herself including go upstairs to her bedroom Time For Goal Achievement: 04/04/15 Progress towards PT goals: Not progressing toward goals - comment (respiratory status continues to limit)    Frequency  Min 5X/week    PT Plan Current plan remains appropriate    Co-evaluation             End of Session Equipment Utilized During Treatment: Back brace;Oxygen Activity Tolerance: Patient limited by fatigue;Treatment limited secondary to medical complications (Comment) (decr SaO2) Patient left: with call bell/phone within reach;in chair;with chair alarm set     Time: [id number] PT Time Calculation (min) (ACUTE ONLY): 34 min  Charges:  $Gait Training: 23-37 mins                    G Codes:      Brandi Stephens 04-15-2015, 4:02 PM Pager [phone]

## 2015-03-25 MED ORDER — DIAZEPAM 5 MG PO TABS: 5.0000 mg | ORAL_TABLET | Freq: Four times a day (QID) | ORAL | Status: DC | PRN

## 2015-03-25 MED ORDER — OXYCODONE-ACETAMINOPHEN 5-325 MG PO TABS: 1.0000 | ORAL_TABLET | Freq: Four times a day (QID) | ORAL | Status: DC | PRN

## 2015-03-25 NOTE — Progress Notes (Signed)
Physical Therapy Treatment Patient Details Name: Brandi Stephens MRN: [id number] DOB: [birth year] Today's Date: 03/25/2015    History of Present Illness 69 y.o. female s/p PLIF L4-5, L5-S1. Post-op having stress incontinence and decr respiratory status    PT Comments    Patient now declining SNF and returning home (although SNF seems most appropriate plan considering her inability to recall and adhere to her back precautions, continued decr SaO2 on room air with activity, and limited activity tolerance due to fatigue/weakness). Also see vitals flow sheet and separate progress note for SaO2. Stair training completed with patient on oxygen. RN aware.     Follow Up Recommendations  Supervision for mobility/OOB;Home health PT (pt now refusing SNF (still best plan) and going home)     Equipment Recommendations  None recommended by PT    Recommendations for Other Services       Precautions / Restrictions Precautions Precautions: Back;Fall Precaution Comments: pt only able to recall 2/3 back precautions; Patient unable to recall precautions. The patient needs reinforcement to maintain precautions with functional mobility.   Required Braces or Orthoses: Spinal Brace Spinal Brace: Lumbar corset;Applied in sitting position Restrictions Weight Bearing Restrictions: No    Mobility  Bed Mobility Overal bed mobility: Needs Assistance Bed Mobility: Rolling;Sidelying to Sit Rolling: Min guard;Supervision Sidelying to sit: HOB elevated;Supervision       General bed mobility comments: vc to prevent twisting  Transfers Overall transfer level: Needs assistance Equipment used: Rolling walker (2 wheeled) Transfers: Sit to/from Stand Sit to Stand: Min guard         General transfer comment: x3 (including toilet with BSC); pt consistently attempts to twist to look behind her as approaching surface; vc for proper use of RW for incr safety  Ambulation/Gait Ambulation/Gait assistance:  Min guard Ambulation Distance (Feet): 15 Feet (x 4 seated rests between) Assistive device: Rolling walker (2 wheeled) Gait Pattern/deviations: Step-through pattern;Decreased stride length;Trunk flexed Gait velocity: decreased   General Gait Details: continued cues to keep RW closer to her   Stairs Stairs: Yes Stairs assistance: Min guard Stair Management: Two rails;Step to pattern;Forwards;Sideways Number of Stairs: 4 (limited by O2 tubingh) General stair comments: Pt reports at front of house she can reach both rails; we discussed going up front steps with rail, vs in garage with no rails  Wheelchair Mobility    Modified Rankin (Stroke Patients Only)       Balance                                    Cognition Arousal/Alertness: Awake/alert Behavior During Therapy: WFL for tasks assessed/performed Overall Cognitive Status: Impaired/Different from baseline Area of Impairment: Memory;Following commands;Awareness;Problem solving;Attention;Safety/judgement   Current Attention Level: Selective Memory: Decreased recall of precautions;Decreased short-term memory Following Commands: Follows one step commands consistently Safety/Judgement: Decreased awareness of safety;Decreased awareness of deficits Awareness: Intellectual Problem Solving: Slow processing;Requires verbal cues;Difficulty sequencing General Comments: continued constant cues to adhere to back precautions (tends to bend and twist)    Exercises      General Comments        Pertinent Vitals/Pain Pain Assessment: 0-10 Pain Score: 7  Pain Location: back Pain Descriptors / Indicators: Operative site guarding Pain Intervention(s): Limited activity within patient's tolerance;Monitored during session;Premedicated before session;Repositioned    Home Living                      Prior  Function            PT Goals (current goals can now be found in the care plan section) Acute Rehab PT  Goals Patient Stated Goal: be able to care for herself including go upstairs to her bedroom Time For Goal Achievement: 04/04/15 Progress towards PT goals: Progressing toward goals    Frequency  Min 5X/week    PT Plan Current plan remains appropriate    Co-evaluation             End of Session Equipment Utilized During Treatment: Back brace;Oxygen Activity Tolerance: Treatment limited secondary to medical complications (Comment);Patient limited by fatigue (decr SaO2) Patient left: with call bell/phone within reach;in chair (room air with SaO2 monitor)     Time: [id number] PT Time Calculation (min) (ACUTE ONLY): 28 min  Charges:  $Gait Training: 23-37 mins                    G Codes:      Javia Dillow 2015/04/13, 12:36 PM Pager [phone]

## 2015-03-25 NOTE — Care Management Note (Signed)
Case Management Note  Patient Details  Name: ALAYNAH MELLEY MRN: [id number] Date of Birth: 10-Sep-1945  Subjective/Objective:                    Action/Plan: Patient will be discharging home with home health PT through [facility].  3N1 is at bedside. Patient with orders for home oxygen therapy, but patient and family have decided to discharge home without oxygen at this time (oxygen saturations are borderline for qualifying at 87% and patient does not have a qualifying diagnosis).  Dr Kathyrn Sheriff is aware that patient may opt not to discharge home on oxygen.  CM discussed at length with patient's daughter, who intends to arrange for patient to follow closely with her PCP.  CM will remain available for any additional discharge needs. Bedside RN updated.    Expected Discharge Date:                  Expected Discharge Plan:  [place]  In-House Referral:     Discharge planning Services  CM Consult  Post Acute Care Choice:  Durable Medical Equipment, Home Health Choice offered to:     DME Arranged:  3-N-1 DME Agency:  [place]:  PT [place]:  [place]  Status of Service:  In process, will continue to follow  Medicare Important Message Given:  Yes Date Medicare IM Given:    Medicare IM give by:    Date Additional Medicare IM Given:    Additional Medicare Important Message give by:     If discussed at [place] of Stay Meetings, dates discussed:    Additional Comments:  Rolm Baptise, RN 03/25/2015, 4:40 PM

## 2015-03-25 NOTE — Progress Notes (Signed)
Met with patient and daughter to discuss discharge planning at the request of Dr Kathyrn Sheriff.  Patient and family are now planning for discharge home with HHPT.  They are requesting a 3n1 at discharge.  They plan to purchase a tub bench privately.  Daughter was provided with a [facility] agency list and will contact CM when a decision has been made for discharge home later today.    Lorne Skeens RN, MSN [phone]

## 2015-03-25 NOTE — Progress Notes (Addendum)
SATURATION QUALIFICATIONS: (This note is used to comply with regulatory documentation for home oxygen)  Patient Saturations on Room Air at Rest = 90%  Patient Saturations on Room Air while Ambulating = 87% (after walking 10 feet)  Patient Saturations on 1 Liters of oxygen while Ambulating = 91%  Please briefly explain why patient needs home oxygen: to maintain SaO2>88% with activity   03/25/2015 Barry Brunner, PT Pager: [phone]

## 2015-03-25 NOTE — Progress Notes (Signed)
Occupational Therapy Treatment Patient Details Name: LENIA MARING MRN: [id number] DOB: 1946-01-01 Today's Date: 03/25/2015    History of present illness 69 y.o. female s/p PLIF L4-5, L5-S1. Post-op having stress incontinence and decr respiratory status   OT comments  Pt now returning home with family assist and HHPT ordered (pt's daughter declining HHOT at this time). Pt continues to need constant cues to adhere to back precautions and to use DME appropriately and safely. Pt completed toilet and tub transfer (with tub bench) with min guard assist for safety. Will continue to follow acutely to address OT needs and goals.    Follow Up Recommendations  No OT follow up;Supervision/Assistance - 24 hour    Equipment Recommendations  3 in 1 bedside comode    Recommendations for Other Services      Precautions / Restrictions Precautions Precautions: Back;Fall Precaution Comments: Pt able to recall 2/3 back precautions. Pt not demonstrating understanding of precautions during functional task Required Braces or Orthoses: Spinal Brace Spinal Brace: Lumbar corset;Applied in sitting position Restrictions Weight Bearing Restrictions: No       Mobility Bed Mobility Overal bed mobility: Needs Assistance Bed Mobility: Supine to Sit;Rolling;Sit to Sidelying Rolling: Supervision Sidelying to sit: Supervision;HOB elevated Supine to sit: Supervision     General bed mobility comments: Pt impulsively progressed to EOB at beginning of session without completing log roll. On entering bed, pt required verbal cues to use log roll technique. No physical assist needed  Transfers Overall transfer level: Needs assistance Equipment used: Rolling walker (2 wheeled) Transfers: Sit to/from Stand Sit to Stand: Min guard         General transfer comment: Pt completed sit-stand transfers x4 (bed, BSC, tub bench, chair). Pt consistently pulling up on RW with both hands despite max verbal cues. Pt also  attempts to twist to look behind when approaching surface to sit.     Balance Overall balance assessment: Needs assistance Sitting-balance support: No upper extremity supported;Feet supported Sitting balance-Leahy Scale: Fair     Standing balance support: Bilateral upper extremity supported;During functional activity Standing balance-Leahy Scale: Poor Standing balance comment: Noted bilateral knee buckling during static standing tasks at sink                   ADL Overall ADL's : Needs assistance/impaired     Grooming: Oral care;Wash/dry hands;Min guard;Cueing for safety;Standing Grooming Details (indicate cue type and reason): Verbal cues for safe positioning of RW and to adhere to back precautions         Upper Body Dressing : Minimal assistance;Sitting Upper Body Dressing Details (indicate cue type and reason): back brace     Toilet Transfer: Min guard;Ambulation;BSC;RW   Toileting- Clothing Manipulation and Hygiene: Min guard;Cueing for back precautions;Sit to/from stand   Tub/ Shower Transfer: Tub transfer;Min guard;Cueing for safety;Ambulation;Tub bench;Rolling walker   Functional mobility during ADLs: Min guard;Rolling walker General ADL Comments: On arrival pt in bed with O2 sats at 97%, when progressed to EOB pt's O2 sats decreased to 83%. Pt still demonstrating some impulsivity and continues to require max verbal cues to adhere to back precautions during ADL tasks and for safe hand placement during transfers. Reviewed precautions, AE, compensatory strategies, and fall prevention strategies. At end of session pt had been on room air for ~20 minutes at O2 sats were at 93%.      Vision  Perception     Praxis      Cognition   Behavior During Therapy: WFL for tasks assessed/performed Overall Cognitive Status: Impaired/Different from baseline Area of Impairment: Memory;Attention;Following commands;Safety/judgement;Awareness;Problem  solving   Current Attention Level: Selective Memory: Decreased recall of precautions;Decreased short-term memory  Following Commands: Follows one step commands consistently Safety/Judgement: Decreased awareness of safety;Decreased awareness of deficits Awareness: Emergent Problem Solving: Slow processing;Requires verbal cues;Difficulty sequencing General Comments: Requires constant cues to adhere to back precautions and to use DME safely    Extremity/Trunk Assessment               Exercises     Shoulder Instructions       General Comments      Pertinent Vitals/ Pain       Pain Assessment: 0-10 Pain Score: 6  Pain Location: back Pain Descriptors / Indicators: Aching;Operative site guarding;Sore Pain Intervention(s): Limited activity within patient's tolerance;Monitored during session;Repositioned  Home Living                                          Prior Functioning/Environment              Frequency Min 2X/week     Progress Toward Goals  OT Goals(current goals can now be found in the care plan section)  Progress towards OT goals: Progressing toward goals  Acute Rehab OT Goals Patient Stated Goal: be able to care for herself including go upstairs to her bedroom OT Goal Formulation: With patient/family Time For Goal Achievement: 03/28/15 Potential to Achieve Goals: Good ADL Goals Pt Will Perform Lower Body Bathing: with set-up;with supervision;with adaptive equipment;sit to/from stand Pt Will Perform Lower Body Dressing: with supervision;with set-up;with adaptive equipment;sit to/from stand Pt Will Transfer to Toilet: with supervision;ambulating Pt Will Perform Toileting - Clothing Manipulation and hygiene: with set-up;sit to/from stand;with supervision;with adaptive equipment Additional ADL Goal #1: Pt will independently verbalize 3/3 back precautions and maintain during session.  Plan Discharge plan needs to be updated     Co-evaluation                 End of Session Equipment Utilized During Treatment: Gait belt;Rolling walker;Back brace   Activity Tolerance Patient tolerated treatment well   Patient Left in bed;with call bell/phone within reach;with bed alarm set;with family/visitor present   Nurse Communication Mobility status;Precautions        Time: 1450-1519 OT Time Calculation (min): 29 min  Charges: OT General Charges $OT Visit: 1 Procedure OT Treatments $Self Care/Home Management : 23-37 mins  Redmond Baseman, OTR/L 03/25/2015, 3:49 PM

## 2015-03-25 NOTE — Care Management Note (Signed)
Case Management Note  Patient Details  Name: Brandi Stephens MRN: [id number] Date of Birth: [birth year]  Subjective/Objective:                    Action/Plan: Spoke with patient's daughter via phone, who has chosen either [facility], [facility] or [facility].  [facility] Go and Jane[facility] Riggers are unable to accept the patient, as she does not have out-of-network benefits. Miranda with [facility] was notified of referral for potential discharge home today.  CM was notified by inpatient PT that patient is desaturating to 87% on RA with ambulation.  Voicemail was left with Dr Cleotilde Neer secretary, Crystal, regarding oxygen saturations.  CM spoke with [facility] DME Jermaine, who states that patient's insurance likely will not cover home oxygen due to a lack of chronic respiratory diagnosis.  CM spoke with patient's daughter, [name], who is potentially interested in privately paying for oxygen if it is ordered by Dr Kathyrn Sheriff.  CM will continue to follow.  Expected Discharge Date:                  Expected Discharge Plan:  [place]  In-House Referral:     Discharge planning Services  CM Consult  Post Acute Care Choice:  Durable Medical Equipment, Home Health Choice offered to:     DME Arranged:  3-N-1 DME Agency:  [place]:  PT [place]:  [place]  Status of Service:  In process, will continue to follow  Medicare Important Message Given:  Yes Date Medicare IM Given:    Medicare IM give by:    Date Additional Medicare IM Given:    Additional Medicare Important Message give by:     If discussed at [place] of Stay Meetings, dates discussed:    Additional Comments:  Rolm Baptise, RN 03/25/2015, 2:26 PM

## 2015-03-25 NOTE — Discharge Summary (Signed)
Physician Discharge Summary  Patient ID: Brandi Stephens MRN: [id number] DOB/AGE: 68-26-1947 68 y.o.  Admit date: 03/20/2015 Discharge date: 03/25/2015  Admission Diagnoses: Lumbar spondylosis  Discharge Diagnoses: Same Active Problems:   Lumbar spondylosis   Discharged Condition: Stable  Hospital Course:  [name] is a 69 y.o. female elecitvely admitted after L4-S1 PLIF. She was at baseline postop, with primarily back pain. She had a slow but progressive recovery. Was able to ambulate with PT. She elected to be discharged home with home health PT rather that pursue SNF admission.  Treatments: Surgery - L4-S1 PLIF  Discharge Exam: Blood pressure 116/71, pulse 93, temperature 98.4 F (36.9 C), temperature source Oral, resp. rate 20, height 5\' 8"  (1.727 m), weight 115.2 kg (253 lb 15.5 oz), SpO2 99 %. Awake, alert, oriented Speech fluent, appropriate CN grossly intact 5/5 BUE/BLE Wound c/d/i  Disposition:   Discharge Instructions    Ambulatory referral to [place]    Complete by:  As directed   Please evaluate Brandi Stephens for admission to [facility].  Disciplines requested: Physical Therapy  Services to provide: Strengthening Exercises and Evaluate  Physician to follow patient's care (the person listed here will be responsible for signing ongoing orders): Referring Provider  Requested Start of Care Date: Tomorrow  I certify that this patient is under my care and that I, or a Nurse Practitioner or Physician's Assistant working with me, had a face-to-face encounter that meets the physician face-to-face requirements with patient on 03/25/15. The encounter with the patient was in whole, or in part for the following medical condition(s) which is the primary reason for home health care (List medical condition). Lumbar spondylosis  Does the patient have Medicare or Medicaid?:  Yes  The encounter with the patient was in whole, or in part, for the following  medical condition, which is the primary reason for home health care:  Lumbar spondylosis  Reason for Medically Necessary Home Health Services:  Therapy- Therapeutic Exercises to Increase Strength and Endurance  My clinical findings support the need for the above services:  Pain interferes with ambulation/mobility  I certify that, based on my findings, the following services are medically necessary home health services:  Physical therapy  Further, I certify that my clinical findings support that this patient is homebound due to:  Pain interferes with ambulation/mobility     For home use only DME Walker rolling    Complete by:  As directed             Medication List    STOP taking these medications        HYDROcodone-acetaminophen 10-325 MG tablet  Commonly known as:  NORCO      TAKE these medications        allopurinol 100 MG tablet  Commonly known as:  ZYLOPRIM  Take 1 tablet by mouth daily.     atorvastatin 20 MG tablet  Commonly known as:  LIPITOR  Take 1 tablet by mouth daily.     baclofen 10 MG tablet  Commonly known as:  LIORESAL  Take 10 mg by mouth as needed for muscle spasms.     diazepam 5 MG tablet  Commonly known as:  VALIUM  Take 1 tablet (5 mg total) by mouth every 6 (six) hours as needed for muscle spasms.     diphenhydramine-acetaminophen 25-500 MG Tabs tablet  Commonly known as:  TYLENOL PM  Take 2 tablets by mouth at bedtime as needed.  furosemide 20 MG tablet  Commonly known as:  LASIX  Take 20 mg by mouth as needed for fluid.     KLOR-CON 10 10 MEQ tablet  Generic drug:  potassium chloride  Take 10 mEq by mouth as needed.     levothyroxine 75 MCG tablet  Commonly known as:  SYNTHROID, LEVOTHROID  Take 75 mcg by mouth daily.     LORazepam 1 MG tablet  Commonly known as:  ATIVAN  Take 1 tablet by mouth every 8 (eight) hours as needed for anxiety.     MAGNESIUM CITRATE PO  Take 2 tablets by mouth at bedtime. Using for constipation      MIRALAX packet  Generic drug:  polyethylene glycol  Take 17 g by mouth at bedtime as needed.     oxyCODONE-acetaminophen 5-325 MG tablet  Commonly known as:  PERCOCET/ROXICET  Take 1 tablet by mouth every 6 (six) hours as needed for moderate pain.     primidone 50 MG tablet  Commonly known as:  MYSOLINE  Take 0.5 tablets (25 mg total) by mouth at bedtime.     valsartan-hydrochlorothiazide 320-12.5 MG tablet  Commonly known as:  DIOVAN-HCT  Take 1 tablet by mouth daily.           Follow-up Information    Follow up with [facility], Roshaun Pound, C, MD In 2 weeks.   Specialty:  Neurosurgery   Contact information:   [address] [place] [address] [phone]       Signed: [name], Loletha Grayer 03/25/2015, 9:56 AM

## 2015-04-16 ENCOUNTER — Ambulatory Visit
Admission: RE | Admit: 2015-04-16 | Discharge: 2015-04-16 | Disposition: A | Discharge status: Home / Self Care | Payer: Commercial Managed Care - HMO | Source: Ambulatory Visit | Attending: Internal Medicine | Admitting: Internal Medicine

## 2015-04-16 ENCOUNTER — Other Ambulatory Visit: Payer: Self-pay | Admitting: Internal Medicine

## 2015-04-16 DIAGNOSIS — R059 Cough, unspecified: Secondary | ICD-10-CM

## 2015-04-16 DIAGNOSIS — R05 Cough: Secondary | ICD-10-CM

## 2015-05-06 ENCOUNTER — Other Ambulatory Visit: Payer: Self-pay | Admitting: Neurosurgery

## 2015-05-06 ENCOUNTER — Ambulatory Visit
Admission: RE | Admit: 2015-05-06 | Discharge: 2015-05-06 | Disposition: A | Discharge status: Home / Self Care | Payer: Commercial Managed Care - HMO | Source: Ambulatory Visit | Attending: Neurosurgery | Admitting: Neurosurgery

## 2015-05-06 DIAGNOSIS — I82403 Acute embolism and thrombosis of unspecified deep veins of lower extremity, bilateral: Secondary | ICD-10-CM

## 2015-05-12 ENCOUNTER — Encounter: Payer: Self-pay | Admitting: Family Medicine

## 2015-05-12 ENCOUNTER — Ambulatory Visit (INDEPENDENT_AMBULATORY_CARE_PROVIDER_SITE_OTHER): Payer: Commercial Managed Care - HMO | Admitting: Family Medicine

## 2015-05-12 VITALS — BP 126/72 | HR 88 | Temp 98.3°F | Ht 66.75 in | Wt 255.2 lb

## 2015-05-12 DIAGNOSIS — R609 Edema, unspecified: Secondary | ICD-10-CM

## 2015-05-12 NOTE — Progress Notes (Signed)
Subjective:    Patient ID: Brandi Stephens, female    DOB: [birth year], 70 y.o.   MRN: [id number]  HPI Chief Complaint  Patient presents with  . new pt    new pt severe edema in feet and ankles.    She is a 70 year old female with a history of recent back surgery, fusion L4-S1, hypothyroidism, hypertension, tremor here to establish primary care. She has been going to [place] for primary care and is switching to [place] due to her provider leaving.  She also has an acute complaint of swelling to bilateral feet and ankles for past 19 days. She was taking steroids for inflammation related to back pain and has been off them for about 1 week. She has been seen and evaluated for edema by neurosurgeon and previous PCP. She had bilateral ultrasound to rule out DVT. Is taking Lasix 40 mg per day for past 2 weeks. Has noticed minimal improvement in edema. She denies fever, chills, cough, orthopnea, PND, chest pain, palpitations.   She is getting in home therapy and walking with rolling walker but states she is mostly staying off of her feet.  Taking oxycodone 4 times daily and valium 2 times daily. Concerned with constipation and is taking 1 capful of MiraLAX daily along with stool softeners. Started Q-var yesterday for cough. This was prescribed by her previous PCP.   She is taking Gabapentin 100 mg three times daily. Taking baclofen as needed.  She had recent lab work and brought the results to me today, which I reviewed with patient.     Thyroid was checked this month and states it was normal- I do not have this result. She is taking daily levothyroxine.  Due for colonoscopy in 2017 with [facility] per patient.    Review of Systems Pertinent positives and negatives in history of present illness.    Objective:   Physical Exam  Constitutional: She is oriented to person, place, and time. She appears well-developed and well-nourished. No distress.  Cardiovascular: Normal rate, regular rhythm, normal  heart sounds and intact distal pulses.   Pulmonary/Chest: Effort normal and breath sounds normal. No respiratory distress. She has no wheezes. She has no rales.  Abdominal: Soft. She exhibits no distension.  Musculoskeletal:  Bilateral feet and ankles with 2 + pitting edema.skin intact  Neurological: She is alert and oriented to person, place, and time.  Skin: Skin is warm and dry. No pallor.  Psychiatric: She has a normal mood and affect. Her behavior is normal. Judgment and thought content normal.   BP 126/72 mmHg  Pulse 88  Temp(Src) 98.3 F (36.8 C) (Oral)  Ht 5' 6.75" (1.695 m)  Wt 255 lb 3.2 oz (115.758 kg)  BMI 40.29 kg/m2        Assessment & Plan:  Dependent edema  Discussed patient with Dr. Redmond School. Discussed differentials for lower extremity edema with patient including the most probable diagnosis of dependent edema related to immobility and obesity and recent steroid use. Discussed that bilateral DVT had been ruled out with ultrasound. Heart related etiology is less likely based on H & P. Also very low on the list with the worsening kidney function as her most recent lab results from [facility] on 05/07/2015 reveals basically normal creatinine and BUN. She does not appear to have a protein deficiency as her serum albumin was within normal range as of 05/07/2015. Discussed that her hemoglobin which was low post surgery is now within normal range. I recommend trying  compression stockings and trying to be more mobile as she can tolerate and otherwise keeping her feet elevated. Patient and daughter are both comfortable with this plan. She will follow up next week or sooner if needed. We will need to keep an eye on her electrolytes since she is taking Lasix, she will continue potassium supplement.  Discussed that I would like for her to have medical records from [place] sent to [place].  Patient questions whether gabapentin is worsening her lower extremity edema, discussed possibility of  stopping this medication to see if she notices improvement.

## 2015-05-12 NOTE — Patient Instructions (Signed)

## 2015-05-13 ENCOUNTER — Telehealth: Payer: Self-pay | Admitting: Family Medicine

## 2015-05-13 NOTE — Telephone Encounter (Signed)
Called pt to inform her that ins provider had been changed to [place]. While on the phone with her she stated that she had tried to use the support hose and they caused her a lot of pain. Spoke to [facility] and was advised to inform pt that she could wear a ace bandage but needed to work her way up to the hose. Pt verbalized understanding. Pt then stated she would like me to inform Vickie that she was able to walk about 25 minutes today.

## 2015-05-13 NOTE — Telephone Encounter (Signed)
Thank you for the information.

## 2015-05-15 ENCOUNTER — Emergency Department (HOSPITAL_COMMUNITY): Payer: Medicare HMO

## 2015-05-15 ENCOUNTER — Inpatient Hospital Stay (HOSPITAL_COMMUNITY)
Admission: EM | Admit: 2015-05-15 | Discharge: 2015-05-21 | DRG: 871 | Disposition: A | Discharge status: Skilled Nursing Facility | Payer: Medicare HMO | Attending: Family Medicine | Admitting: Family Medicine

## 2015-05-15 ENCOUNTER — Encounter (HOSPITAL_COMMUNITY): Payer: Self-pay

## 2015-05-15 DIAGNOSIS — M109 Gout, unspecified: Secondary | ICD-10-CM | POA: Diagnosis present

## 2015-05-15 DIAGNOSIS — E669 Obesity, unspecified: Secondary | ICD-10-CM | POA: Diagnosis present

## 2015-05-15 DIAGNOSIS — F1123 Opioid dependence with withdrawal: Secondary | ICD-10-CM | POA: Diagnosis present

## 2015-05-15 DIAGNOSIS — Z87891 Personal history of nicotine dependence: Secondary | ICD-10-CM

## 2015-05-15 DIAGNOSIS — E039 Hypothyroidism, unspecified: Secondary | ICD-10-CM | POA: Diagnosis present

## 2015-05-15 DIAGNOSIS — J441 Chronic obstructive pulmonary disease with (acute) exacerbation: Secondary | ICD-10-CM | POA: Diagnosis present

## 2015-05-15 DIAGNOSIS — F13239 Sedative, hypnotic or anxiolytic dependence with withdrawal, unspecified: Secondary | ICD-10-CM | POA: Diagnosis present

## 2015-05-15 DIAGNOSIS — A419 Sepsis, unspecified organism: Secondary | ICD-10-CM | POA: Diagnosis present

## 2015-05-15 DIAGNOSIS — Z6833 Body mass index (BMI) 33.0-33.9, adult: Secondary | ICD-10-CM | POA: Diagnosis not present

## 2015-05-15 DIAGNOSIS — E785 Hyperlipidemia, unspecified: Secondary | ICD-10-CM | POA: Diagnosis present

## 2015-05-15 DIAGNOSIS — E872 Acidosis: Secondary | ICD-10-CM | POA: Diagnosis present

## 2015-05-15 DIAGNOSIS — Z880 Allergy status to penicillin: Secondary | ICD-10-CM

## 2015-05-15 DIAGNOSIS — D539 Nutritional anemia, unspecified: Secondary | ICD-10-CM | POA: Diagnosis present

## 2015-05-15 DIAGNOSIS — Z981 Arthrodesis status: Secondary | ICD-10-CM | POA: Diagnosis not present

## 2015-05-15 DIAGNOSIS — M62838 Other muscle spasm: Secondary | ICD-10-CM | POA: Diagnosis present

## 2015-05-15 DIAGNOSIS — F10239 Alcohol dependence with withdrawal, unspecified: Secondary | ICD-10-CM | POA: Diagnosis present

## 2015-05-15 DIAGNOSIS — Z7951 Long term (current) use of inhaled steroids: Secondary | ICD-10-CM | POA: Diagnosis not present

## 2015-05-15 DIAGNOSIS — Y95 Nosocomial condition: Secondary | ICD-10-CM | POA: Diagnosis present

## 2015-05-15 DIAGNOSIS — J189 Pneumonia, unspecified organism: Secondary | ICD-10-CM | POA: Diagnosis present

## 2015-05-15 DIAGNOSIS — Z885 Allergy status to narcotic agent status: Secondary | ICD-10-CM

## 2015-05-15 DIAGNOSIS — R410 Disorientation, unspecified: Secondary | ICD-10-CM | POA: Diagnosis present

## 2015-05-15 DIAGNOSIS — R4182 Altered mental status, unspecified: Secondary | ICD-10-CM | POA: Diagnosis present

## 2015-05-15 DIAGNOSIS — R0602 Shortness of breath: Secondary | ICD-10-CM

## 2015-05-15 DIAGNOSIS — J90 Pleural effusion, not elsewhere classified: Secondary | ICD-10-CM | POA: Diagnosis present

## 2015-05-15 DIAGNOSIS — R600 Localized edema: Secondary | ICD-10-CM | POA: Diagnosis present

## 2015-05-15 DIAGNOSIS — F13231 Sedative, hypnotic or anxiolytic dependence with withdrawal delirium: Secondary | ICD-10-CM | POA: Diagnosis not present

## 2015-05-15 DIAGNOSIS — K5900 Constipation, unspecified: Secondary | ICD-10-CM | POA: Diagnosis not present

## 2015-05-15 DIAGNOSIS — I10 Essential (primary) hypertension: Secondary | ICD-10-CM | POA: Diagnosis present

## 2015-05-15 DIAGNOSIS — D7589 Other specified diseases of blood and blood-forming organs: Secondary | ICD-10-CM | POA: Diagnosis not present

## 2015-05-15 DIAGNOSIS — F13939 Sedative, hypnotic or anxiolytic use, unspecified with withdrawal, unspecified: Secondary | ICD-10-CM | POA: Diagnosis present

## 2015-05-15 LAB — COMPREHENSIVE METABOLIC PANEL
ALT: 16 U/L (ref 14–54)
AST: 34 U/L (ref 15–41)
Albumin: 3.3 g/dL — ABNORMAL LOW (ref 3.5–5.0)
Alkaline Phosphatase: 78 U/L (ref 38–126)
Anion gap: 14 (ref 5–15)
BUN: 34 mg/dL — AB (ref 6–20)
CHLORIDE: 106 mmol/L (ref 101–111)
CO2: 24 mmol/L (ref 22–32)
Calcium: 9.7 mg/dL (ref 8.9–10.3)
Creatinine, Ser: 1.59 mg/dL — ABNORMAL HIGH (ref 0.44–1.00)
GFR, EST AFRICAN AMERICAN: 37 mL/min — AB (ref >60)
GFR, EST NON AFRICAN AMERICAN: 32 mL/min — AB (ref >60)
Glucose, Bld: 131 mg/dL — ABNORMAL HIGH (ref 65–99)
POTASSIUM: 5 mmol/L (ref 3.5–5.1)
SODIUM: 144 mmol/L (ref 135–145)
Total Bilirubin: 0.8 mg/dL (ref 0.3–1.2)
Total Protein: 6.4 g/dL — ABNORMAL LOW (ref 6.5–8.1)

## 2015-05-15 LAB — I-STAT CG4 LACTIC ACID, ED
LACTIC ACID, VENOUS: 2.59 mmol/L — AB (ref 0.5–2.0)
LACTIC ACID, VENOUS: 1.46 mmol/L (ref 0.5–2.0)

## 2015-05-15 LAB — CBC
HEMATOCRIT: 43.3 % (ref 36.0–46.0)
Hemoglobin: 13.8 g/dL (ref 12.0–15.0)
MCH: 32.2 pg (ref 26.0–34.0)
MCHC: 31.9 g/dL (ref 30.0–36.0)
MCV: 100.9 fL — AB (ref 78.0–100.0)
Platelets: 255 10*3/uL (ref 150–400)
RBC: 4.29 MIL/uL (ref 3.87–5.11)
RDW: 15 % (ref 11.5–15.5)
WBC: 13.9 10*3/uL — AB (ref 4.0–10.5)

## 2015-05-15 LAB — URINALYSIS, ROUTINE W REFLEX MICROSCOPIC
BILIRUBIN URINE: NEGATIVE
GLUCOSE, UA: NEGATIVE mg/dL
HGB URINE DIPSTICK: NEGATIVE
KETONES UR: NEGATIVE mg/dL
Leukocytes, UA: NEGATIVE
Nitrite: NEGATIVE
PH: 5 (ref 5.0–8.0)
Protein, ur: NEGATIVE mg/dL
Specific Gravity, Urine: 1.012 (ref 1.005–1.030)

## 2015-05-15 LAB — PROCALCITONIN

## 2015-05-15 LAB — I-STAT ARTERIAL BLOOD GAS, ED
ACID-BASE EXCESS: 1 mmol/L (ref 0.0–2.0)
BICARBONATE: 27 meq/L — AB (ref 20.0–24.0)
O2 SAT: 99 %
PH ART: 7.366 (ref 7.350–7.450)
PO2 ART: 159 mmHg — AB (ref 80.0–100.0)
Patient temperature: 98.6
TCO2: 28 mmol/L (ref 0–100)
pCO2 arterial: 47.1 mmHg — ABNORMAL HIGH (ref 35.0–45.0)

## 2015-05-15 LAB — PROTIME-INR
INR: 1.11 (ref 0.00–1.49)
Prothrombin Time: 14.5 seconds (ref 11.6–15.2)

## 2015-05-15 LAB — MAGNESIUM: Magnesium: 1.8 mg/dL (ref 1.7–2.4)

## 2015-05-15 LAB — TSH: TSH: 0.787 u[IU]/mL (ref 0.350–4.500)

## 2015-05-15 LAB — TROPONIN I: TROPONIN I: 0.06 ng/mL — AB (ref <0.031)

## 2015-05-15 LAB — MRSA PCR SCREENING: MRSA BY PCR: NEGATIVE

## 2015-05-15 LAB — PHOSPHORUS: Phosphorus: 4 mg/dL (ref 2.5–4.6)

## 2015-05-15 LAB — APTT: APTT: 31 s (ref 24–37)

## 2015-05-15 LAB — BRAIN NATRIURETIC PEPTIDE: B Natriuretic Peptide: 145.5 pg/mL — ABNORMAL HIGH (ref 0.0–100.0)

## 2015-05-15 LAB — T4, FREE: Free T4: 0.95 ng/dL (ref 0.61–1.12)

## 2015-05-15 MED ORDER — SODIUM CHLORIDE 0.9 % IV SOLN
2.0000 g | Freq: Three times a day (TID) | INTRAVENOUS | Status: DC
  Administered 2015-05-16 – 2015-05-17 (×5): 2 g via INTRAVENOUS
  Filled 2015-05-15 (×6): qty 2000

## 2015-05-15 MED ORDER — FOLIC ACID 5 MG/ML IJ SOLN
1.0000 mg | Freq: Every day | INTRAMUSCULAR | Status: DC
  Administered 2015-05-15 – 2015-05-20 (×6): 1 mg via INTRAVENOUS
  Filled 2015-05-15 (×7): qty 0.2

## 2015-05-15 MED ORDER — HEPARIN SODIUM (PORCINE) 5000 UNIT/ML IJ SOLN
5000.0000 [IU] | Freq: Three times a day (TID) | INTRAMUSCULAR | Status: DC
  Administered 2015-05-15 – 2015-05-21 (×16): 5000 [IU] via SUBCUTANEOUS
  Filled 2015-05-15 (×16): qty 1

## 2015-05-15 MED ORDER — DEXTROSE 5 % IV SOLN
700.0000 mg | Freq: Two times a day (BID) | INTRAVENOUS | Status: DC
  Administered 2015-05-15 – 2015-05-17 (×4): 700 mg via INTRAVENOUS
  Filled 2015-05-15 (×6): qty 14

## 2015-05-15 MED ORDER — VANCOMYCIN HCL 10 G IV SOLR
1250.0000 mg | INTRAVENOUS | Status: DC
  Administered 2015-05-16 – 2015-05-17 (×2): 1250 mg via INTRAVENOUS
  Filled 2015-05-15 (×2): qty 1250

## 2015-05-15 MED ORDER — SODIUM CHLORIDE 0.9 % IV SOLN
2.0000 g | Freq: Once | INTRAVENOUS | Status: AC
  Administered 2015-05-15: 2 g via INTRAVENOUS
  Filled 2015-05-15: qty 2000

## 2015-05-15 MED ORDER — ACETAMINOPHEN 650 MG RE SUPP
650.0000 mg | Freq: Four times a day (QID) | RECTAL | Status: DC | PRN
  Administered 2015-05-17: 650 mg via RECTAL
  Filled 2015-05-15: qty 1

## 2015-05-15 MED ORDER — LORAZEPAM 2 MG/ML IJ SOLN
1.0000 mg | Freq: Three times a day (TID) | INTRAMUSCULAR | Status: DC
  Administered 2015-05-15 – 2015-05-16 (×4): 1 mg via INTRAVENOUS
  Filled 2015-05-15 (×4): qty 1

## 2015-05-15 MED ORDER — LORAZEPAM 2 MG/ML IJ SOLN
1.0000 mg | Freq: Once | INTRAMUSCULAR | Status: AC
  Administered 2015-05-15: 1 mg via INTRAVENOUS
  Filled 2015-05-15: qty 1

## 2015-05-15 MED ORDER — SODIUM CHLORIDE 0.9 % IV SOLN: 2.0000 g | Freq: Once | INTRAVENOUS | Status: DC

## 2015-05-15 MED ORDER — VANCOMYCIN HCL 10 G IV SOLR
2000.0000 mg | Freq: Once | INTRAVENOUS | Status: AC
  Administered 2015-05-15: 2000 mg via INTRAVENOUS
  Filled 2015-05-15: qty 2000

## 2015-05-15 MED ORDER — DEXTROSE 5 % IV SOLN
2.0000 g | Freq: Once | INTRAVENOUS | Status: AC
  Administered 2015-05-15: 2 g via INTRAVENOUS
  Filled 2015-05-15: qty 2

## 2015-05-15 MED ORDER — ACETAMINOPHEN 650 MG RE SUPP
650.0000 mg | Freq: Once | RECTAL | Status: AC
  Administered 2015-05-15: 650 mg via RECTAL
  Filled 2015-05-15: qty 1

## 2015-05-15 MED ORDER — SODIUM CHLORIDE 0.9 % IV BOLUS (SEPSIS)
1000.0000 mL | INTRAVENOUS | Status: AC
  Administered 2015-05-15 (×3): 1000 mL via INTRAVENOUS

## 2015-05-15 MED ORDER — SODIUM CHLORIDE 0.9 % IV BOLUS (SEPSIS)
500.0000 mL | INTRAVENOUS | Status: AC
  Administered 2015-05-15: 500 mL via INTRAVENOUS

## 2015-05-15 MED ORDER — SODIUM CHLORIDE 0.9 % IV BOLUS (SEPSIS): 1000.0000 mL | INTRAVENOUS | Status: AC

## 2015-05-15 MED ORDER — DEXTROSE 5 % IV SOLN: 2.0000 g | Freq: Two times a day (BID) | INTRAVENOUS | Status: DC

## 2015-05-15 MED ORDER — LORAZEPAM 2 MG/ML IJ SOLN
2.0000 mg | INTRAMUSCULAR | Status: DC | PRN
  Administered 2015-05-16 – 2015-05-17 (×2): 2 mg via INTRAVENOUS
  Administered 2015-05-17: 1 mg via INTRAVENOUS
  Administered 2015-05-17 – 2015-05-18 (×3): 2 mg via INTRAVENOUS
  Filled 2015-05-15 (×2): qty 1
  Filled 2015-05-15: qty 2
  Filled 2015-05-15 (×3): qty 1

## 2015-05-15 MED ORDER — SODIUM CHLORIDE 0.9 % IV SOLN
INTRAVENOUS | Status: DC
  Administered 2015-05-15 – 2015-05-16 (×3): via INTRAVENOUS

## 2015-05-15 MED ORDER — ACETAMINOPHEN 325 MG PO TABS
650.0000 mg | ORAL_TABLET | Freq: Four times a day (QID) | ORAL | Status: DC | PRN
  Administered 2015-05-15 – 2015-05-21 (×8): 650 mg via ORAL
  Filled 2015-05-15 (×7): qty 2

## 2015-05-15 MED ORDER — THIAMINE HCL 100 MG/ML IJ SOLN
100.0000 mg | Freq: Every day | INTRAMUSCULAR | Status: DC
  Administered 2015-05-15 – 2015-05-19 (×5): 100 mg via INTRAVENOUS
  Filled 2015-05-15 (×5): qty 2

## 2015-05-15 MED ORDER — PIPERACILLIN-TAZOBACTAM 3.375 G IVPB: 3.3750 g | Freq: Three times a day (TID) | INTRAVENOUS | Status: DC

## 2015-05-15 MED ORDER — SODIUM CHLORIDE 0.9 % IV BOLUS (SEPSIS): 500.0000 mL | INTRAVENOUS | Status: AC

## 2015-05-15 MED ORDER — IOHEXOL 350 MG/ML SOLN
80.0000 mL | Freq: Once | INTRAVENOUS | Status: AC | PRN
  Administered 2015-05-15: 80 mL via INTRAVENOUS

## 2015-05-15 MED ORDER — ONDANSETRON HCL 4 MG/2ML IJ SOLN
4.0000 mg | Freq: Four times a day (QID) | INTRAMUSCULAR | Status: DC | PRN
  Administered 2015-05-15 – 2015-05-17 (×2): 4 mg via INTRAVENOUS
  Filled 2015-05-15 (×2): qty 2

## 2015-05-15 MED ORDER — HYDROMORPHONE HCL 1 MG/ML IJ SOLN
1.0000 mg | Freq: Once | INTRAMUSCULAR | Status: AC
  Administered 2015-05-15: 1 mg via INTRAVENOUS
  Filled 2015-05-15: qty 1

## 2015-05-15 NOTE — Progress Notes (Signed)
ANTIBIOTIC CONSULT NOTE - INITIAL  Pharmacy Consult for Cefepoime Indication: Sepsis  Allergies  Allergen Reactions  . Codeine Nausea And Vomiting  . Hydromorphone Itching  . Morphine And Related Itching  . Penicillins Other (See Comments)    Childhood reaction Has patient had a PCN reaction causing immediate rash, facial/tongue/throat swelling, SOB or lightheadedness with hypotension: No Has patient had a PCN reaction causing severe rash involving mucus membranes or skin necrosis: No Has patient had a PCN reaction that required hospitalization No Has patient had a PCN reaction occurring within the last 10 years: No If all of the above answers are "NO", then may proceed with Cephalosporin use.     Patient Measurements: Height: 5\' 10"  (177.8 cm) Weight: 230 lb (104.327 kg) IBW/kg (Calculated) : 68.5  Vital Signs: Temp: 101.3 F (38.5 C) (01/27 1130) Temp Source: Rectal (01/27 1130) BP: 94/58 mmHg (01/27 1139) Pulse Rate: 123 (01/27 1116) Intake/Output from previous day:   Intake/Output from this shift:    Labs:  Recent Labs  05/15/15 1111  WBC 13.9*  HGB 13.8  PLT 255   CrCl cannot be calculated (Patient has no serum creatinine result on file.). No results for input(s): VANCOTROUGH, VANCOPEAK, VANCORANDOM, GENTTROUGH, GENTPEAK, GENTRANDOM, TOBRATROUGH, TOBRAPEAK, TOBRARND, AMIKACINPEAK, AMIKACINTROU, AMIKACIN in the last 72 hours.   Microbiology: No results found for this or any previous visit (from the past 720 hour(s)).  Medical History: Past Medical History  Diagnosis Date  . Hypertension   . Arthritis   . Hemorrhoids   . PONV (postoperative nausea and vomiting)   . Hypothyroidism     Assessment: 70 yo F presents on 1/27 with AMS. Pharmacy consulted to dose abx for sepsis. Tmax is 101.3 in the ED, WBC elevated at 13.9. Vancomycin 2g load dose ordered per ED physician. SCr elevated at 1.59, normalized CrCl ~9ml/min.   Goal of Therapy:  Resolution of  infection  Plan:  Give cefepime 2g IV x 1, then start cefepime 2g IV Q12 Give vancomycin 2g IV x1, then start vancomycin 1.25g IV Q24 Monitor clinical picture, renal function, VT prn F/U C&S, abx deescalation / LOT  Elenor Quinones, PharmD, BCPS Clinical Pharmacist Pager [phone] 05/15/2015 11:46 AM

## 2015-05-15 NOTE — H&P (Signed)
[facility] Admission History and Physical Service Pager: [phone]  Patient name: Brandi Stephens Medical record number: [id number] Date of birth: 11/02/1945 Age: 70 y.o. Gender: female  Primary Care Provider: Harland Dingwall, NP Consultants: None Code Status: Full  Chief Complaint: Altered mental status, agitation  Assessment and Plan: Brandi Stephens is a 70 y.o. female presenting with sepsis due to unknown source and altered mental status. PMH is significant for HTN, lower back pain s/p surgery in December 2016.   Sepsis: In the ED, hypotensive to 94/58, febrile to 101.3, tachycardic to 112, tachypneic to 24. Also with some altered mental status that started this morning. She was initially requiring non-rebreather but was transitioned back to 2.5L O2. WBC 13.9. Lactic acidosis to 2.59. CXR was negative for infiltrates. CTA chest was negative for PE, but showed airspace disease in the right upper lobe worrisome for pneumonia. However, the pneumonia doesn't look bad enough to be causing her agitation and altered mental status. Given her recent back surgery in December 2016, we are concerned for meningitis or hardware infection. On exam, she is agitated and constantly moving her arms and legs, and she does not want me to shine a light in her eyes. She does not respond to questions and is unable to communicate what is bothering her. - Admit to stepdown, attending Dr. McDiarmid. - Will start her on Vanc, Zosyn, Ampicillin, Acyclovir for now. Will de-escalate as appropriate. - IR consult for LP, given recent spinal fusion. - Neurosurgeon: advised to pursue LP proximal to her fusion sites.  - Fluids per sepsis protocol - Trend lactic acid - Blood and urine cultures pending - Cardiac monitoring and continuous pulse ox  Altered Mental Status. Oriented only to person. Likely secondary to meningitis vs sepsis 2/2 pneumonia vs withdrawal from alcohol. No new changes in  medications recently. AMS started all of the sudden this morning. - Work-up as above - Will monitor closely  Chronic back pain s/p L4-L5-S1 fusion. Surgery performed 03/20/15. Pt has also been having muscle spasms since her surgery. - Holding home Baclofen 10mg  daily  Hypertension. Pt hypotensive to 94/58 in the ED, but BPs improved after fluids. - Holding home Valsartan-HCTZ 320-12.5mg  in the setting of sepsis.  Lower extremity edema. No ECHO in our records - Holding home Lasix 20mg  daily for now in the setting of sepsis  Hypothyroidism. TSH 0.787, T4 0.95 - Holding home Levothyroxine  Hyperlipidemia - Holding home Lipitor 20mg    Alcohol use. Pt drinks ~10 drinks per week per family. - Given her AMS and agitation, will start her on CIWA protocol  - Scheduled Ativan 1mg  q8hrs  FEN/GI: Fluids per sepsis protocol Prophylaxis: Heparin sq  Disposition: Admit to stepdown, attending Dr. McDiarmid  History of Present Illness:  Brandi Stephens is a 70 y.o. female presenting with altered mental status and agitation since this morning. Yesterday afternoon, her husband noticed that she had become a little groggy and was unable form sentences. This morning, she seemed even more out of it and she would not respond to her husband. She was also "shaking off the bed" and has had constant jerking movements of her limbs since then. At one point this morning, her husband could not wake her up, so he called EMS.  She had an L4-L5-S1 spinal fusion on March 20, 2015. She spent 6 days in the hospital. Since then, she has been more groggy and has had limited mobility. Her surgeon has been trying to decrease  her pain medication, but she continues to be out of it. Three weeks ago, she started having muscle spasms in her arms and legs. She also developed recent swelling in her legs, and her doctor ruled out blood clots by ultrasound.   Her husband recently had the flu.  In the ED, she was hypotensive to  94/58, febrile to 101.3, tachycardic to 112, tachypneic to 24. She was initially requiring non-rebreather for O2 saturations to 88% on 5L [place], but was subsequently transitioned to 2.5L [place]. CXR was negative for infiltrates. CTA chest was negative for PE, but showed airspace disease in the right upper lobe worrisome for pneumonia. She was started on fluids per sepsis protocol as well as Vanc and Zosyn.   Review Of Systems: Per HPI with the following additions: No vomiting, no diarrhea, no changes in skin. Otherwise the remainder of the systems were negative.  Patient Active Problem List   Diagnosis Date Noted  . Sepsis ([place]) 05/15/2015  . Lumbar spondylosis 03/20/2015  . Paresthesias 04/01/2013  . Tremor 03/19/2013    Past Medical History: Past Medical History  Diagnosis Date  . Hypertension   . Arthritis   . Hemorrhoids   . PONV (postoperative nausea and vomiting)   . Hypothyroidism     Past Surgical History: Past Surgical History  Procedure Laterality Date  . Basal cell carcinoma excision  10/15    nose  . Abdominal hysterectomy  1985  . Breast reduction surgery  1998  . Cervical spine surgery  1995    hallo  . Knee arthroscopy Right 2002  . Colectomy  2005    right side  . Hemorrhoid surgery  2008  . Tonsillectomy    . Tubal ligation    . Hand surgery Left   . Eye surgery Left 07/2014    cataract removed 3 months later    Social History: Social History  Substance Use Topics  . Smoking status: Former Smoker -- 1.00 packs/day for 35 years    Types: Cigarettes    Quit date: 03/15/2010  . Smokeless tobacco: None  . Alcohol Use: 0.6 oz/week    1 Glasses of wine per week   Additional social history: Drinks ~10 drinks per week. Please also refer to relevant sections of EMR.  Family History: History reviewed. No pertinent family history. Mother and father are deceased Family history of heart disease.  Allergies and Medications: Allergies  Allergen Reactions  .  Codeine Nausea And Vomiting  . Hydromorphone Itching  . Morphine And Related Itching  . Penicillins Other (See Comments)    Childhood reaction Has patient had a PCN reaction causing immediate rash, facial/tongue/throat swelling, SOB or lightheadedness with hypotension: No Has patient had a PCN reaction causing severe rash involving mucus membranes or skin necrosis: No Has patient had a PCN reaction that required hospitalization No Has patient had a PCN reaction occurring within the last 10 years: No If all of the above answers are "NO", then may proceed with Cephalosporin use.    No current facility-administered medications on file prior to encounter.   Current Outpatient Prescriptions on File Prior to Encounter  Medication Sig Dispense Refill  . allopurinol (ZYLOPRIM) 100 MG tablet Take 1 tablet by mouth daily.    Marland Kitchen atorvastatin (LIPITOR) 20 MG tablet Take 1 tablet by mouth daily.    . baclofen (LIORESAL) 10 MG tablet Take 10 mg by mouth as needed for muscle spasms.     . beclomethasone (QVAR) 80 MCG/ACT inhaler Inhale  2 puffs into the lungs 2 (two) times daily.    . diazepam (VALIUM) 5 MG tablet Take 1 tablet (5 mg total) by mouth every 6 (six) hours as needed for muscle spasms. 30 tablet 0  . diphenhydramine-acetaminophen (TYLENOL PM) 25-500 MG TABS tablet Take 2 tablets by mouth at bedtime as needed.    . furosemide (LASIX) 20 MG tablet Take 40 mg by mouth as needed for fluid.     Marland Kitchen KLOR-CON 10 10 MEQ tablet Take 10 mEq by mouth as needed.    Marland Kitchen levothyroxine (SYNTHROID, LEVOTHROID) 75 MCG tablet Take 75 mcg by mouth daily.    Marland Kitchen LORazepam (ATIVAN) 1 MG tablet Take 1 tablet by mouth 3 (three) times daily.     Marland Kitchen MAGNESIUM CITRATE PO Take 2 tablets by mouth at bedtime. Using for constipation    . oxyCODONE-acetaminophen (PERCOCET/ROXICET) 5-325 MG tablet Take 1 tablet by mouth every 6 (six) hours as needed for moderate pain. 60 tablet 0  . polyethylene glycol (MIRALAX) packet Take 17 g by  mouth at bedtime as needed.    . primidone (MYSOLINE) 50 MG tablet Take 0.5 tablets (25 mg total) by mouth at bedtime. (Patient taking differently: Take 50 mg by mouth at bedtime. ) 60 tablet 3  . valsartan-hydrochlorothiazide (DIOVAN-HCT) 320-12.5 MG per tablet Take 1 tablet by mouth daily.      Objective: BP 103/78 mmHg  Pulse 111  Temp(Src) 101.3 F (38.5 C) (Rectal)  Resp 20  Ht 5\' 10"  (1.778 m)  Wt 230 lb (104.327 kg)  BMI 33.00 kg/m2  SpO2 99% Exam: General: Appears agitated, moving arms and legs constantly, restless, will tell us her name but will not answer any other questions, moaning, keeps her eyes closed Eyes: Patient does not want her eyes to be opened, but pupils are reactive. ENTM: Mucous membranes dry and tacky Neck: Supple, no lymphadenopathy Cardiovascular: Tachycardic, regular rhythm, 1+ DP pulses  Respiratory: CTAB, [place] in place, normal work of breathing Abdomen: +BS, soft, non-distended, seemingly non-tender MSK: 1+ edema up to the ankle Skin: No rashes or lesions. Neuro: Agitated, not answering questions, moving arms and legs Psych: oriented to self   Labs and Imaging: CBC BMET   Recent Labs Lab 05/15/15 1111  WBC 13.9*  HGB 13.8  HCT 43.3  PLT 255    Recent Labs Lab 05/15/15 1111  NA 144  K 5.0  CL 106  CO2 24  BUN 34*  CREATININE 1.59*  GLUCOSE 131*  CALCIUM 9.7     Lactic acid 2.59 ABG: pH 7.366, pCO2 47.1, pO2 159, bicarb 27.0 UA: negative ketones, negative leukocytes, negative nitrites.  CXR: CTA chest: Negative for PE, airspace disease in the posterior segment of the right upper lobe could be due to atelectasis but has an appearance worrisome for pneumonia.  Sela Hua, MD 05/15/2015, 2:36 PM PGY-1, [place] Intern pager: [phone], text pages welcome  Upper Level Addendum:  I have seen and evaluated this patient along with Dr. Brett Albino and reviewed the above note, making necessary revisions in [facility].    Clearance Coots, MD Family Medicine PGY-3

## 2015-05-15 NOTE — ED Notes (Signed)
Mittens applied to bilateral arms to decrease pt pulling at cords/IV's.

## 2015-05-15 NOTE — ED Notes (Signed)
Pt removed from NRB and placed on [place] 2L at this time.  O2 sat remains 95%

## 2015-05-15 NOTE — ED Notes (Signed)
Pt became very restless/agitated and d/c both IV's due to the agitation.  Admitting MD at bedside and v/o received for Ativan and Dilaudid.

## 2015-05-15 NOTE — Progress Notes (Signed)
ANTIBIOTIC CONSULT NOTE - INITIAL  Pharmacy Consult for Ampicillin Indication: meningitis  Allergies  Allergen Reactions  . Codeine Nausea And Vomiting  . Hydromorphone Itching  . Morphine And Related Itching  . Penicillins Other (See Comments)    Childhood reaction Has patient had a PCN reaction causing immediate rash, facial/tongue/throat swelling, SOB or lightheadedness with hypotension: No Has patient had a PCN reaction causing severe rash involving mucus membranes or skin necrosis: No Has patient had a PCN reaction that required hospitalization No Has patient had a PCN reaction occurring within the last 10 years: No If all of the above answers are "NO", then may proceed with Cephalosporin use.     Patient Measurements: Height: 5\' 10"  (177.8 cm) Weight: 230 lb (104.327 kg) IBW/kg (Calculated) : 68.5 Adjusted Body Weight: 89 kg  Vital Signs: Temp: 101.3 F (38.5 C) (01/27 1130) Temp Source: Rectal (01/27 1130) BP: 116/50 mmHg (01/27 1657) Pulse Rate: 117 (01/27 1657) Intake/Output from previous day:   Intake/Output from this shift: Total I/O In: -  Out: 750 [Urine:750]  Labs:  Recent Labs  05/15/15 1111  WBC 13.9*  HGB 13.8  PLT 255  CREATININE 1.59*   Estimated Creatinine Clearance: 44.3 mL/min (by C-G formula based on Cr of 1.59). No results for input(s): VANCOTROUGH, VANCOPEAK, VANCORANDOM, GENTTROUGH, GENTPEAK, GENTRANDOM, TOBRATROUGH, TOBRAPEAK, TOBRARND, AMIKACINPEAK, AMIKACINTROU, AMIKACIN in the last 72 hours.   Microbiology: No results found for this or any previous visit (from the past 720 hour(s)).  Medical History: Past Medical History  Diagnosis Date  . Hypertension   . Arthritis   . Hemorrhoids   . PONV (postoperative nausea and vomiting)   . Hypothyroidism     Medications:   (Not in a hospital admission) Scheduled:   Infusions:  . acyclovir    . [START ON 05/16/2015] piperacillin-tazobactam (ZOSYN)  IV    . [START ON 05/16/2015]  vancomycin     Assessment: 69yo F w/ hx of recent back surgery, fusion L4-S1, Hypothyroid, HTN, tremor. Presents w/ severe edema in feet and ankles. Pharmacy consulted to dose Ampicillin for meningitis. Temp 101.3, WBC 13.9, scr 1.59, CrCl 44.3, no cx results.  Goal of Therapy:  Eradication of Infection  Plan:  Ampicillin 2g IV q8h Follow up culture results, abx tx, clinical course  Georgiann Mohs, PharmD Student  05/15/2015,5:25 PM

## 2015-05-15 NOTE — Progress Notes (Signed)
[place] for acyclovir Indication: r/o meningitis  Allergies  Allergen Reactions  . Codeine Nausea And Vomiting  . Hydromorphone Itching  . Morphine And Related Itching  . Penicillins Other (See Comments)    Childhood reaction Has patient had a PCN reaction causing immediate rash, facial/tongue/throat swelling, SOB or lightheadedness with hypotension: No Has patient had a PCN reaction causing severe rash involving mucus membranes or skin necrosis: No Has patient had a PCN reaction that required hospitalization No Has patient had a PCN reaction occurring within the last 10 years: No If all of the above answers are "NO", then may proceed with Cephalosporin use.     Patient Measurements: Height: 5\' 10"  (177.8 cm) Weight: 230 lb (104.327 kg) IBW/kg (Calculated) : 68.5  Vital Signs: Temp: 101.3 F (38.5 C) (01/27 1130) Temp Source: Rectal (01/27 1130) BP: 116/50 mmHg (01/27 1657) Pulse Rate: 117 (01/27 1657) Intake/Output from previous day:   Intake/Output from this shift: Total I/O In: -  Out: 750 [Urine:750]  Labs:  Recent Labs  05/15/15 1111  WBC 13.9*  HGB 13.8  PLT 255  CREATININE 1.59*   Estimated Creatinine Clearance: 44.3 mL/min (by C-G formula based on Cr of 1.59). No results for input(s): VANCOTROUGH, VANCOPEAK, VANCORANDOM, GENTTROUGH, GENTPEAK, GENTRANDOM, TOBRATROUGH, TOBRAPEAK, TOBRARND, AMIKACINPEAK, AMIKACINTROU, AMIKACIN in the last 72 hours.   Microbiology: No results found for this or any previous visit (from the past 720 hour(s)).  Medical History: Past Medical History  Diagnosis Date  . Hypertension   . Arthritis   . Hemorrhoids   . PONV (postoperative nausea and vomiting)   . Hypothyroidism     Assessment: 70 yo F presents on 1/27 with AMS.  Pharmacy consulted to add acyclovir for r/o meningitis. SCr elevated at 1.59, normalized CrCl ~69ml/min.   Goal of Therapy:  Resolution of infection  Plan:   -Acyclovir 700mg  IV q12h (dosing based on ideal body weight and renal function) -Will follow patient progress  Hildred Laser, Pharm D 05/15/2015 5:21 PM

## 2015-05-15 NOTE — H&P (Signed)
I have seen and examined this patient. I have reviewed lab results and viewed Chest CT.   I have discussed with Dr Brett Albino and Dr Raeford Razor.    Sepsis secondary to healthcare associated pneumonia - qSOFA = 3 ==> consider sepsis - Patient also at risk of meningitis given recent Posterior Lumbar Interbody Fusion L4-S1 for lumbar spondylosis by Dr Kathyrn Sheriff (NS) - Patient takes  benzodiazapines (Valium 2x/d usually, up to 4 times a day) and opiates (oxycodone 4x/d) regularly at home.  She takes primidone at bedtime.  The family believes she may not have had these medications in over 24 hours which puts her at risk of withdrawal.    Recommendation - Use Sepsis order set - Agree with empiric antibiotics to cover HCAP and possible meningtitis. - Consult with IR about LP given recent Posterior Lumbar Interbody Fusion  - Bolus NS fluid to keep MAP >= 65 mmHg - If unable to keep MAP >= 65 mmHG with IV fluids, consult Critical Care medicine service.  - If agitation prevents delivery of necessary medical care, consult Critical Care medicine service - CIWA protocol

## 2015-05-15 NOTE — ED Notes (Signed)
Inpatient RN unavailable for report, will call back. 

## 2015-05-15 NOTE — ED Notes (Signed)
Admitting paged to be made aware of pt continued agitation

## 2015-05-15 NOTE — ED Notes (Signed)
Pt placed on NRB at 15L.

## 2015-05-15 NOTE — Progress Notes (Signed)
Pharmacy Code Sepsis Protocol  Time of code sepsis page: 1139 []  Antibiotics delivered at 1143  Were antibiotics ordered at the time of the code sepsis page? Yes Was it required to contact the physician? [x]  Physician not contacted []  Physician contacted to order antibiotics for code sepsis []  Physician contacted to recommend changing antibiotics  Pharmacy consulted for: Cefepime  Anti-infectives    Start     Dose/Rate Route Frequency Ordered Stop   05/15/15 1145  vancomycin (VANCOCIN) 2,000 mg in sodium chloride 0.9 % 500 mL IVPB     2,000 mg 250 mL/hr over 120 Minutes Intravenous  Once 05/15/15 1133     05/15/15 1145  ceFEPIme (MAXIPIME) 2 g in dextrose 5 % 50 mL IVPB     2 g 100 mL/hr over 30 Minutes Intravenous  Once 05/15/15 1138          Nurse education provided: [x]  Minutes left to administer antibiotics to achieve 1 hour goal [x]  Correct order of antibiotic administration [x]  Antibiotic Y-site compatibilities     Reginia Naas, PharmD 05/15/2015, 11:52 AM

## 2015-05-15 NOTE — Progress Notes (Signed)
ANTIBIOTIC CONSULT NOTE  Pharmacy Consult for Cefepime changed to Zosyn Indication: Sepsis  Allergies  Allergen Reactions  . Codeine Nausea And Vomiting  . Hydromorphone Itching  . Morphine And Related Itching  . Penicillins Other (See Comments)    Childhood reaction Has patient had a PCN reaction causing immediate rash, facial/tongue/throat swelling, SOB or lightheadedness with hypotension: No Has patient had a PCN reaction causing severe rash involving mucus membranes or skin necrosis: No Has patient had a PCN reaction that required hospitalization No Has patient had a PCN reaction occurring within the last 10 years: No If all of the above answers are "NO", then may proceed with Cephalosporin use.     Patient Measurements: Height: 5\' 10"  (177.8 cm) Weight: 230 lb (104.327 kg) IBW/kg (Calculated) : 68.5  Vital Signs: Temp: 101.3 F (38.5 C) (01/27 1130) Temp Source: Rectal (01/27 1130) BP: 127/95 mmHg (01/27 1500) Pulse Rate: 111 (01/27 1330) Intake/Output from previous day:   Intake/Output from this shift: Total I/O In: -  Out: 750 [Urine:750]  Labs:  Recent Labs  05/15/15 1111  WBC 13.9*  HGB 13.8  PLT 255  CREATININE 1.59*   Estimated Creatinine Clearance: 44.3 mL/min (by C-G formula based on Cr of 1.59). No results for input(s): VANCOTROUGH, VANCOPEAK, VANCORANDOM, GENTTROUGH, GENTPEAK, GENTRANDOM, TOBRATROUGH, TOBRAPEAK, TOBRARND, AMIKACINPEAK, AMIKACINTROU, AMIKACIN in the last 72 hours.   Microbiology: No results found for this or any previous visit (from the past 720 hour(s)).  Medical History: Past Medical History  Diagnosis Date  . Hypertension   . Arthritis   . Hemorrhoids   . PONV (postoperative nausea and vomiting)   . Hypothyroidism     Assessment: 70 yo F presents on 1/27 with AMS. Pharmacy consulted to dose abx for sepsis. Tmax is 101.3 in the ED, WBC elevated at 13.9. Vancomycin 2g load dose ordered per ED physician. SCr elevated at  1.59, normalized CrCl ~22ml/min.   Pharmacy is consulted to transition patient from cefepime to zosyn for sepsis.   Goal of Therapy:  Resolution of infection  Plan:  Stop cefepime Start zosyn 3.375g IV q8h Continue vancomycin 1250mg  IV q24h Monitor clinical picture, renal function, VT prn F/U C&S, abx deescalation / LOT  Andrey Cota. Diona Foley, PharmD, [place] Clinical Pharmacist Pager [phone] 05/15/2015 3:56 PM

## 2015-05-15 NOTE — ED Notes (Signed)
Admitting MD at bedside.

## 2015-05-15 NOTE — ED Provider Notes (Signed)
CSN: [id number]     Arrival date & time 05/15/15  1056 History   First MD Initiated Contact with Patient 05/15/15 1107     Chief Complaint  Patient presents with  . Altered Mental Status     (Consider location/radiation/quality/duration/timing/severity/associated sxs/prior Treatment) HPI   35 y f w pmh htn, hypothyroid on synthroid, who had back surgery in December.  She has also been followed by her pcp for spasm in her extremities of unknown etiology.  She comes in today with altered mental status and weakness since today.  She denies headache/neck pain/chest pain/sob/abdominal pain.  She has had some cough for the past few days.  With ems she was noted to be hypoxic in the 80's.   Past Medical History  Diagnosis Date  . Hypertension   . Arthritis   . Hemorrhoids   . PONV (postoperative nausea and vomiting)   . Hypothyroidism    Past Surgical History  Procedure Laterality Date  . Basal cell carcinoma excision  10/15    nose  . Abdominal hysterectomy  1985  . Breast reduction surgery  1998  . Cervical spine surgery  1995    hallo  . Knee arthroscopy Right 2002  . Colectomy  2005    right side  . Hemorrhoid surgery  2008  . Tonsillectomy    . Tubal ligation    . Hand surgery Left   . Eye surgery Left 07/2014    cataract removed 3 months later   History reviewed. No pertinent family history. Social History  Substance Use Topics  . Smoking status: Former Smoker -- 1.00 packs/day for 35 years    Types: Cigarettes    Quit date: 03/15/2010  . Smokeless tobacco: None  . Alcohol Use: 0.6 oz/week    1 Glasses of wine per week   OB History    No data available     Review of Systems  Constitutional: Positive for fever. Negative for chills.  HENT: Negative for nosebleeds.   Eyes: Negative for visual disturbance.  Respiratory: Positive for cough. Negative for shortness of breath.   Cardiovascular: Negative for chest pain.  Gastrointestinal: Negative for nausea,  vomiting, abdominal pain, diarrhea and constipation.  Genitourinary: Negative for dysuria.  Skin: Negative for rash.  Neurological: Positive for weakness.  All other systems reviewed and are negative.     Allergies  Codeine; Hydromorphone; Morphine and related; and Penicillins  Home Medications   Prior to Admission medications   Medication Sig Start Date End Date Taking? Authorizing Provider  allopurinol (ZYLOPRIM) 100 MG tablet Take 1 tablet by mouth daily. 01/15/13  Yes Historical Provider, MD  atorvastatin (LIPITOR) 20 MG tablet Take 1 tablet by mouth daily. 02/13/13  Yes Historical Provider, MD  baclofen (LIORESAL) 10 MG tablet Take 10 mg by mouth as needed for muscle spasms.  10/11/13  Yes Historical Provider, MD  beclomethasone (QVAR) 80 MCG/ACT inhaler Inhale 2 puffs into the lungs 2 (two) times daily.   Yes Historical Provider, MD  dextromethorphan-guaiFENesin (MUCINEX DM) 30-600 MG 12hr tablet Take 1 tablet by mouth 2 (two) times daily.   Yes Historical Provider, MD  diazepam (VALIUM) 5 MG tablet Take 1 tablet (5 mg total) by mouth every 6 (six) hours as needed for muscle spasms. 03/25/15  Yes [name], MD  diphenhydramine-acetaminophen (TYLENOL PM) 25-500 MG TABS tablet Take 2 tablets by mouth at bedtime as needed.   Yes Historical Provider, MD  furosemide (LASIX) 20 MG tablet Take 40 mg by  mouth as needed for fluid.  09/11/13  Yes Historical Provider, MD  gabapentin (NEURONTIN) 100 MG capsule Take 100-300 mg by mouth 3 (three) times daily. 100 mg 3 times daily and 300 mg at bedtime   Yes Historical Provider, MD  KLOR-CON 10 10 MEQ tablet Take 10 mEq by mouth as needed. 09/11/13  Yes Historical Provider, MD  levothyroxine (SYNTHROID, LEVOTHROID) 75 MCG tablet Take 75 mcg by mouth daily. 01/09/15  Yes Historical Provider, MD  LORazepam (ATIVAN) 1 MG tablet Take 1 tablet by mouth 3 (three) times daily.  02/22/13  Yes Historical Provider, MD  MAGNESIUM CITRATE PO Take 2 tablets  by mouth at bedtime. Using for constipation   Yes Historical Provider, MD  oxyCODONE-acetaminophen (PERCOCET/ROXICET) 5-325 MG tablet Take 1 tablet by mouth every 6 (six) hours as needed for moderate pain. 03/25/15  Yes [name], MD  polyethylene glycol [facility]) packet Take 17 g by mouth at bedtime as needed.   Yes Historical Provider, MD  primidone (MYSOLINE) 50 MG tablet Take 0.5 tablets (25 mg total) by mouth at bedtime. Patient taking differently: Take 50 mg by mouth at bedtime.  11/14/13  Yes Drema Dallas, DO  valsartan-hydrochlorothiazide (DIOVAN-HCT) 320-12.5 MG per tablet Take 1 tablet by mouth daily. 03/04/13  Yes Historical Provider, MD   BP 116/50 mmHg  Pulse 117  Temp(Src) 98.9 F (37.2 C) (Axillary)  Resp 19  Ht 5\' 10"  (1.778 m)  Wt 104.327 kg  BMI 33.00 kg/m2  SpO2 93% Physical Exam  Constitutional: No distress.  HENT:  Head: Normocephalic and atraumatic.  Eyes: EOM are normal. Pupils are equal, round, and reactive to light.  Neck: Normal range of motion. Neck supple.  Cardiovascular: Regular rhythm and intact distal pulses.   Pulmonary/Chest: She is in respiratory distress.  Abdominal: Soft. There is no tenderness.  Musculoskeletal: Normal range of motion.  Neurological: She is alert.  Oriented to person/place Pupils 65mm bl and reactive to light Intact motor function in all four extremities  Skin: No rash noted. She is not diaphoretic.  Psychiatric: She has a normal mood and affect.    ED Course  Procedures (including critical care time) Labs Review Labs Reviewed  COMPREHENSIVE METABOLIC PANEL - Abnormal; Notable for the following:    Glucose, Bld 131 (*)    BUN 34 (*)    Creatinine, Ser 1.59 (*)    Total Protein 6.4 (*)    Albumin 3.3 (*)    GFR calc non Af Amer 32 (*)    GFR calc Af Amer 37 (*)    All other components within normal limits  CBC - Abnormal; Notable for the following:    WBC 13.9 (*)    MCV 100.9 (*)    All other components  within normal limits  BRAIN NATRIURETIC PEPTIDE - Abnormal; Notable for the following:    B Natriuretic Peptide 145.5 (*)    All other components within normal limits  I-STAT CG4 LACTIC ACID, ED - Abnormal; Notable for the following:    Lactic Acid, Venous 2.59 (*)    All other components within normal limits  I-STAT ARTERIAL BLOOD GAS, ED - Abnormal; Notable for the following:    pCO2 arterial 47.1 (*)    pO2, Arterial 159.0 (*)    Bicarbonate 27.0 (*)    All other components within normal limits  CULTURE, BLOOD (ROUTINE X 2)  CULTURE, BLOOD (ROUTINE X 2)  URINE CULTURE  URINALYSIS, ROUTINE W REFLEX MICROSCOPIC (NOT AT [facility])  TSH  T4, FREE  I-STAT CG4 LACTIC ACID, ED    Imaging Review Ct Angio Chest Pe W/cm &/or Wo Cm  05/15/2015  CLINICAL DATA:  Hypoxia and confusion today.  Initial encounter. EXAM: CT ANGIOGRAPHY CHEST WITH CONTRAST TECHNIQUE: Multidetector CT imaging of the chest was performed using the standard protocol during bolus administration of intravenous contrast. Multiplanar CT image reconstructions and MIPs were obtained to evaluate the vascular anatomy. CONTRAST:  80 mL OMNIPAQUE IOHEXOL 350 MG/ML SOLN COMPARISON:  Single view of the chest earlier today and PA and lateral chest 04/16/2015. FINDINGS: No pulmonary embolus is identified. There is cardiomegaly. No pleural or pericardial effusion. Aortic atherosclerotic vascular disease is noted. No axillary, hilar or mediastinal lymphadenopathy. The lungs demonstrate emphysematous disease. Airspace disease is seen in the posterior segment of the right upper lobe. Bilateral dependent atelectatic change is also identified. The lungs are otherwise unremarkable. Incidentally imaged upper abdomen demonstrates no focal abnormality. No focal bony abnormality is seen. Review of the MIP images confirms the above findings. IMPRESSION: Negative for pulmonary embolus. Airspace disease in the posterior segment of the right upper lobe could be  due to atelectasis but has an appearance worrisome for pneumonia. Emphysema. Cardiomegaly. Atherosclerosis. Electronically Signed   By: Inge Rise M.D.   On: 05/15/2015 13:27   Dg Chest Port 1 View  05/15/2015  CLINICAL DATA:  Fever EXAM: PORTABLE CHEST 1 VIEW COMPARISON:  April 16, 2015 FINDINGS: There is no edema or consolidation. Heart is borderline enlarged with pulmonary vascularity within normal limits. No adenopathy. No bone lesions. IMPRESSION: No edema or consolidation.  Borderline cardiomegaly. Electronically Signed   By: Lowella Grip III M.D.   On: 05/15/2015 11:48   I have personally reviewed and evaluated these images and lab results as part of my medical decision-making.   EKG Interpretation   Date/Time:  Friday May 15 2015 11:10:23 EST Ventricular Rate:  122 PR Interval:  138 QRS Duration: 101 QT Interval:  324 QTC Calculation: 462 R Axis:   -37 Text Interpretation:  Sinus tachycardia Left axis deviation Low voltage,  extremity and precordial leads Abnormal R-wave progression, late  transition Borderline ST depression, lateral leads Confirmed by [facility] MD,  Corene Cornea [id number]) on 05/15/2015 11:34:12 AM      MDM   Final diagnoses:  1.  Sepsis due to unspecified organism 2.  CAP 3.  Tachycardia 4.  Altered mental status 5.  delirium    23 y f w pmh htn, hypothyroid on synthroid, who had back surgery in December.  She has also been followed by her pcp for spasm in her extremities of unknown etiology, she was recently on steroids and is here w/ ams, hypoxia, fever.  Exam as above, febrile and tachycardic.  She has no nuchal rigidity and denies headache, doubt meningitis.  Concern for delirium 2/2 sepsis.  Pt not on any serotonergic or dopamine inhibitors.  pt on synthroid, will check tsh/free t4.  She has no abdominal ttp. Will obtain cxr/ua/cbc/cmp/lactic.  Will obtain bcx x2 and cover w/ vanc/cefepime. Will give fluid bolus  Lactic w/ mild elevation,  cxr w/o infiltrate but pt w/ hypoxia, will obtain pe study Cbc w/ elevated wbc, ua clean  Ct pe study w/ infiltrate.  Will admit to stepdown w/ family med for further evaluation.       Jarome Matin, MD 05/15/15 [id number]  Merrily Pew, MD 05/16/15 [phone]

## 2015-05-15 NOTE — ED Notes (Signed)
Pt brought in EMS for altered mental status.  Pt last seen normal was last night at 1800 by husband.  Husband found pt this morning downstairs confused and unable to ambulate.  Per husband pt was seen in ED "3-4 days ago for overdosing on prednisone"  Pt was given neb. tx PTA and 300cc NS.

## 2015-05-16 DIAGNOSIS — A419 Sepsis, unspecified organism: Principal | ICD-10-CM

## 2015-05-16 DIAGNOSIS — R4182 Altered mental status, unspecified: Secondary | ICD-10-CM | POA: Diagnosis present

## 2015-05-16 DIAGNOSIS — R410 Disorientation, unspecified: Secondary | ICD-10-CM

## 2015-05-16 LAB — URINE CULTURE

## 2015-05-16 LAB — CBC
HCT: 40.1 % (ref 36.0–46.0)
Hemoglobin: 12.7 g/dL (ref 12.0–15.0)
MCH: 32.4 pg (ref 26.0–34.0)
MCHC: 31.7 g/dL (ref 30.0–36.0)
MCV: 102.3 fL — AB (ref 78.0–100.0)
PLATELETS: 200 10*3/uL (ref 150–400)
RBC: 3.92 MIL/uL (ref 3.87–5.11)
RDW: 15 % (ref 11.5–15.5)
WBC: 10.8 10*3/uL — AB (ref 4.0–10.5)

## 2015-05-16 LAB — BASIC METABOLIC PANEL
Anion gap: 7 (ref 5–15)
BUN: 17 mg/dL (ref 6–20)
CHLORIDE: 113 mmol/L — AB (ref 101–111)
CO2: 26 mmol/L (ref 22–32)
CREATININE: 0.98 mg/dL (ref 0.44–1.00)
Calcium: 9 mg/dL (ref 8.9–10.3)
GFR calc Af Amer: >60 mL/min (ref >60)
GFR calc non Af Amer: 58 mL/min — ABNORMAL LOW (ref >60)
Glucose, Bld: 135 mg/dL — ABNORMAL HIGH (ref 65–99)
POTASSIUM: 4.6 mmol/L (ref 3.5–5.1)
Sodium: 146 mmol/L — ABNORMAL HIGH (ref 135–145)

## 2015-05-16 LAB — GLUCOSE, CAPILLARY: GLUCOSE-CAPILLARY: 124 mg/dL — AB (ref 65–99)

## 2015-05-16 LAB — TROPONIN I
Troponin I: 0.06 ng/mL — ABNORMAL HIGH (ref <0.031)
Troponin I: 0.05 ng/mL — ABNORMAL HIGH (ref <0.031)

## 2015-05-16 MED ORDER — LEVOTHYROXINE SODIUM 100 MCG IV SOLR
37.5000 ug | Freq: Every day | INTRAVENOUS | Status: DC
  Administered 2015-05-16 – 2015-05-20 (×5): 37.5 ug via INTRAVENOUS
  Filled 2015-05-16 (×5): qty 5

## 2015-05-16 MED ORDER — WHITE PETROLATUM GEL
Status: AC
  Filled 2015-05-16: qty 1

## 2015-05-16 MED ORDER — PHENAZOPYRIDINE HCL 100 MG PO TABS
100.0000 mg | ORAL_TABLET | Freq: Three times a day (TID) | ORAL | Status: DC
  Filled 2015-05-16 (×2): qty 1

## 2015-05-16 MED ORDER — PHENOL 1.4 % MT LIQD
1.0000 | OROMUCOSAL | Status: DC | PRN
  Administered 2015-05-16 – 2015-05-18 (×2): 1 via OROMUCOSAL
  Filled 2015-05-16: qty 177

## 2015-05-16 MED ORDER — DEXTROSE-NACL 5-0.45 % IV SOLN
INTRAVENOUS | Status: DC
  Administered 2015-05-16 – 2015-05-17 (×3): via INTRAVENOUS
  Administered 2015-05-18: 125 mL/h via INTRAVENOUS
  Administered 2015-05-18 – 2015-05-19 (×4): via INTRAVENOUS

## 2015-05-16 NOTE — Evaluation (Signed)
Clinical/Bedside Swallow Evaluation Patient Details  Name: TABRESHA RAINES MRN: [id number] Date of Birth: [birth year]  Today's Date: 05/16/2015 Time: SLP Start Time (ACUTE ONLY): [id number] SLP Stop Time (ACUTE ONLY): 1316 SLP Time Calculation (min) (ACUTE ONLY): 11 min  Past Medical History:  Past Medical History  Diagnosis Date  . Hypertension   . Arthritis   . Hemorrhoids   . PONV (postoperative nausea and vomiting)   . Hypothyroidism    Past Surgical History:  Past Surgical History  Procedure Laterality Date  . Basal cell carcinoma excision  10/15    nose  . Abdominal hysterectomy  1985  . Breast reduction surgery  1998  . Cervical spine surgery  1995    hallo  . Knee arthroscopy Right 2002  . Colectomy  2005    right side  . Hemorrhoid surgery  2008  . Tonsillectomy    . Tubal ligation    . Hand surgery Left   . Eye surgery Left 07/2014    cataract removed 3 months later   HPI:  Jenessa Sieler Lujan is a 70 y.o. female presenting with sepsis due to unknown source and altered mental status. PMH is significant for HTN, lower back pain s/p surgery in December 2016. CT of chest signifcant for Airspace disease in the posterior segment of the right upper lobe could be due to atelectasis but has an appearance worrisome for pneumonia   Assessment / Plan / Recommendation Clinical Impression  Pt presents with a cognitively based dysphagia impacting both oral and pharyngeal stages of the swallow. Pt only able to tolerate partially upright positioning secondary to reports of signficant pain in back with semi-upright positioning. Husband states pt has not recovered from back surgery 4 weeks prior.  Pt with consistent immediate coughing with both ice chips and puree textures concerning for reduced airway protection. Pts decreased mentation, decreased respiratory status, and inadequate positioning greatly increase her aspiration risk. Recommend NPO until mentation improves with medicines via  alternative means. Educated husband regarding NPO recommendations who verbalized understanding. SLP to closely monitor for PO readiness.     Aspiration Risk  Moderate aspiration risk    Diet Recommendation     Medication Administration: Via alternative means    Other  Recommendations Oral Care Recommendations: Oral care QID   Follow up Recommendations       Frequency and Duration min 2x/week  1 week       Prognosis Prognosis for Safe Diet Advancement: Good Barriers to Reach Goals: Time post onset      Swallow Study   General Date of Onset: 05/15/15 HPI: JOURNEY LOCH is a 70 y.o. female presenting with sepsis due to unknown source and altered mental status. PMH is significant for HTN, lower back pain s/p surgery in December 2016. CT of chest signifcant for Airspace disease in the posterior segment of the right upper lobe could be due to atelectasis but has an appearance worrisome for pneumonia Type of Study: Bedside Swallow Evaluation Previous Swallow Assessment: none on file Diet Prior to this Study: NPO Temperature Spikes Noted: No Respiratory Status: Nasal cannula History of Recent Intubation: No Behavior/Cognition: Confused;Agitated;Lethargic/Drowsy;Doesn't follow directions Oral Cavity Assessment: Dried secretions;Dry Oral Care Completed by SLP: Yes Oral Cavity - Dentition: Adequate natural dentition Self-Feeding Abilities: Needs assist Patient Positioning: Partially reclined (secondary to significant reported pain in back ) Baseline Vocal Quality: Low vocal intensity Volitional Cough: Cognitively unable to elicit Volitional Swallow: Unable to elicit    Oral/Motor/Sensory Function Overall Oral  Motor/Sensory Function: Generalized oral weakness   Ice Chips Ice chips: Impaired Oral Phase Impairments: Reduced lingual movement/coordination Oral Phase Functional Implications: Prolonged oral transit Pharyngeal Phase Impairments: Suspected delayed Swallow;Cough -  Immediate   Thin Liquid Thin Liquid: Not tested    Nectar Thick     Honey Thick     Puree Puree: Impaired Presentation: Spoon Oral Phase Impairments: Reduced lingual movement/coordination Oral Phase Functional Implications: Prolonged oral transit Pharyngeal Phase Impairments: Cough - Immediate   Solid   GO   Solid: Not tested       Arvil Chaco MA, CCC-SLP Acute Care Speech Language Pathologist    Arvil Chaco E 05/16/2015,1:17 PM

## 2015-05-16 NOTE — Plan of Care (Signed)
Problem: Fluid Volume: Goal: Hemodynamic stability will improve Outcome: Progressing Progressing at this time

## 2015-05-16 NOTE — Progress Notes (Signed)
Text page to Resident Dimas Chyle to make aware that pt will not stay still - very restless  so not able to go down to IR for LP.

## 2015-05-16 NOTE — Progress Notes (Signed)
Family Medicine Teaching Service Daily Progress Note Intern Pager: [phone]  Patient name: Brandi Stephens Medical record number: [id number] Date of birth: [birth year] Age: 70 y.o. Gender: female  Primary Care Provider: Harland Dingwall, NP Consultants: Neurosurgery, IR Code Status: Full  Pt Overview and Major Events to Date:  1/27 - Admitted with AMS and agitation  Assessment and Plan: Brandi Stephens is a 70 y.o. female presenting with sepsis due to unknown source and altered mental status. PMH is significant for HTN, lower back pain s/p surgery in December 2016.   Sepsis: 4/4 SIRS criteria on admission with qSOFA score of 3. Lactic acidosis to 2.59 improved to 1.46 with fluids. CXR was negative for infiltrates. CTA chest was negative for PE, but showed airspace disease in the right upper lobe worrisome for pneumonia. Also at increased risk for meningitis given her recent back surgery in December 2016.  - Continue Vanc, Zosyn, Ampicillin, Acyclovir for now. Will de-escalate as appropriate. - Urine and blood cultures pending - Obtain CT head - Low threshold to consult CCM for low MAPs - IR consult for LP, given recent spinal fusion. - Neurosurgeon: advised to pursue LP proximal to her fusion sites.  - Fluids per sepsis protocol - Cardiac monitoring and continuous pulse ox  Altered Mental Status. Improving. Differential includes meningitis vs sepsis 2/2 pneumonia vs withdrawal from alcohol. No new changes in medications recently.  - Work-up as above - Will monitor closely - Swallow eval  Chronic back pain s/p L4-L5-S1 fusion. Surgery performed 03/20/15. Pt has also been having muscle spasms since her surgery.  - Holding home Baclofen 10mg  daily  Hypertension. Patient hypotensive in the setting of sepsis - Holding home Valsartan-HCTZ 320-12.5mg  in the setting of sepsis and hypotension  Lower extremity edema. No ECHO in our records - Holding home Lasix 20mg  daily for now in the  setting of sepsis and hypotension  Hypothyroidism. TSH 0.787, T4 0.95 - Holding home Levothyroxine 82mcg daily restart if able to pass eval, if not will start IV levothyroxine  Hyperlipidemia - Holding home Lipitor 20mg    Alcohol use. Pt drinks ~10 drinks per week per family. Was also prescribed valium and ativan as home medications. - Given her AMS and agitation, will start her on CIWA protocol  - Scheduled Ativan 1mg  q8hrs  FEN/GI: Fluids per sepsis protocol Prophylaxis: Heparin sq  Disposition: Admit to stepdown, attending Dr. McDiarmid  Subjective:  Oriented to person and place. Not oriented to year. Follows commands.  Objective: Temp:  [98.2 F (36.8 C)-101.3 F (38.5 C)] 98.6 F (37 C) (01/28 0748) Pulse Rate:  [94-123] 99 (01/28 0748) Resp:  [14-26] 20 (01/28 0748) BP: (93-149)/(46-99) 93/80 mmHg (01/28 0748) SpO2:  [83 %-99 %] 95 % (01/28 0748) Weight:  [230 lb (104.327 kg)-245 lb 2.4 oz (111.2 kg)] 245 lb 2.4 oz (111.2 kg) (01/27 1824) Physical Exam: General: 70 year old female lying in hospital bed in NAD Cardiovascular: RRR Respiratory: Owen in place. NWOB, CTAB Abdomen: Obese, +BS, nonditended Extremities: No cyanosis or edema noted Neuro: Responds to questions and commands. Oriented to person and place. PERRL. Moves all extremities spontaneously  Laboratory/Imaging/Diagnostic Tests:  Recent Labs Lab 05/15/15 1111 05/16/15 0644  WBC 13.9* 10.8*  HGB 13.8 12.7  HCT 43.3 40.1  PLT 255 200    Recent Labs Lab 05/15/15 1111 05/16/15 0644  NA 144 146*  K 5.0 4.6  CL 106 113*  CO2 24 26  BUN 34* 17  CREATININE 1.59* 0.98  CALCIUM  9.7 9.0  PROT 6.4*  --   BILITOT 0.8  --   ALKPHOS 78  --   ALT 16  --   AST 34  --   GLUCOSE 131* 135*     Recent Labs Lab 05/15/15 1925 05/16/15 0046 05/16/15 0644  TROPONINI 0.06* 0.06* 0.05*   Vivi Barrack, MD 05/16/2015, 8:13 AM PGY-2, [place] Intern pager: [phone], text pages  welcome

## 2015-05-16 NOTE — Progress Notes (Signed)
Pt CIWA at 75- Given Ativan 2 Mg IV Pt has started seeing small dogs. Husband visiting says they haven't had any pets in a very long time. Pt is calling me by her sister's name. Tried to reorient. Pt only oriented to self

## 2015-05-17 ENCOUNTER — Inpatient Hospital Stay (HOSPITAL_COMMUNITY): Payer: Medicare HMO

## 2015-05-17 DIAGNOSIS — F13231 Sedative, hypnotic or anxiolytic dependence with withdrawal delirium: Secondary | ICD-10-CM

## 2015-05-17 DIAGNOSIS — J189 Pneumonia, unspecified organism: Secondary | ICD-10-CM | POA: Diagnosis present

## 2015-05-17 DIAGNOSIS — D7589 Other specified diseases of blood and blood-forming organs: Secondary | ICD-10-CM | POA: Diagnosis present

## 2015-05-17 DIAGNOSIS — D539 Nutritional anemia, unspecified: Secondary | ICD-10-CM | POA: Diagnosis present

## 2015-05-17 DIAGNOSIS — R410 Disorientation, unspecified: Secondary | ICD-10-CM | POA: Diagnosis present

## 2015-05-17 DIAGNOSIS — F13239 Sedative, hypnotic or anxiolytic dependence with withdrawal, unspecified: Secondary | ICD-10-CM | POA: Diagnosis present

## 2015-05-17 DIAGNOSIS — F13939 Sedative, hypnotic or anxiolytic use, unspecified with withdrawal, unspecified: Secondary | ICD-10-CM | POA: Diagnosis present

## 2015-05-17 LAB — CORTISOL-AM, BLOOD: Cortisol - AM: 8.3 ug/dL (ref 6.7–22.6)

## 2015-05-17 LAB — BASIC METABOLIC PANEL
ANION GAP: 6 (ref 5–15)
BUN: 8 mg/dL (ref 6–20)
CHLORIDE: 107 mmol/L (ref 101–111)
CO2: 27 mmol/L (ref 22–32)
Calcium: 8.9 mg/dL (ref 8.9–10.3)
Creatinine, Ser: 0.78 mg/dL (ref 0.44–1.00)
GFR calc Af Amer: >60 mL/min (ref >60)
GLUCOSE: 131 mg/dL — AB (ref 65–99)
POTASSIUM: 4 mmol/L (ref 3.5–5.1)
Sodium: 140 mmol/L (ref 135–145)

## 2015-05-17 LAB — CBC
HEMATOCRIT: 35.6 % — AB (ref 36.0–46.0)
HEMOGLOBIN: 10.9 g/dL — AB (ref 12.0–15.0)
MCH: 31.2 pg (ref 26.0–34.0)
MCHC: 30.6 g/dL (ref 30.0–36.0)
MCV: 102 fL — AB (ref 78.0–100.0)
Platelets: 198 10*3/uL (ref 150–400)
RBC: 3.49 MIL/uL — ABNORMAL LOW (ref 3.87–5.11)
RDW: 14.8 % (ref 11.5–15.5)
WBC: 10 10*3/uL (ref 4.0–10.5)

## 2015-05-17 MED ORDER — PIPERACILLIN-TAZOBACTAM 3.375 G IVPB
3.3750 g | Freq: Three times a day (TID) | INTRAVENOUS | Status: AC
  Administered 2015-05-17 – 2015-05-19 (×8): 3.375 g via INTRAVENOUS
  Filled 2015-05-17 (×10): qty 50

## 2015-05-17 MED ORDER — LORAZEPAM 2 MG/ML IJ SOLN
1.0000 mg | Freq: Two times a day (BID) | INTRAMUSCULAR | Status: DC
  Administered 2015-05-17 – 2015-05-20 (×5): 1 mg via INTRAVENOUS
  Filled 2015-05-17 (×5): qty 1

## 2015-05-17 MED ORDER — GABAPENTIN 100 MG PO CAPS
100.0000 mg | ORAL_CAPSULE | Freq: Three times a day (TID) | ORAL | Status: DC
  Administered 2015-05-17 – 2015-05-21 (×12): 100 mg via ORAL
  Filled 2015-05-17 (×12): qty 1

## 2015-05-17 MED ORDER — VANCOMYCIN HCL IN DEXTROSE 750-5 MG/150ML-% IV SOLN
750.0000 mg | Freq: Two times a day (BID) | INTRAVENOUS | Status: DC
  Administered 2015-05-18 – 2015-05-19 (×4): 750 mg via INTRAVENOUS
  Filled 2015-05-17 (×5): qty 150

## 2015-05-17 MED ORDER — DEXTROSE 5 % IV SOLN
700.0000 mg | Freq: Three times a day (TID) | INTRAVENOUS | Status: DC
  Administered 2015-05-17 – 2015-05-19 (×6): 700 mg via INTRAVENOUS
  Filled 2015-05-17 (×9): qty 14

## 2015-05-17 MED ORDER — BUDESONIDE 0.25 MG/2ML IN SUSP
0.2500 mg | Freq: Two times a day (BID) | RESPIRATORY_TRACT | Status: DC
  Administered 2015-05-17 – 2015-05-21 (×8): 0.25 mg via RESPIRATORY_TRACT
  Filled 2015-05-17 (×8): qty 2

## 2015-05-17 MED ORDER — ALBUTEROL SULFATE (2.5 MG/3ML) 0.083% IN NEBU: 2.5000 mg | INHALATION_SOLUTION | Freq: Four times a day (QID) | RESPIRATORY_TRACT | Status: DC | PRN

## 2015-05-17 MED ORDER — OXYCODONE HCL 5 MG PO TABS: 5.0000 mg | ORAL_TABLET | Freq: Four times a day (QID) | ORAL | Status: DC

## 2015-05-17 NOTE — Progress Notes (Signed)
Pharmacy Antibiotic Follow-up Note  Brandi Stephens is a 70 y.o. year-old female admitted on 05/15/2015.  The patient is currently on day three of Vancomycin, Ampicillin and Acyclovir for HCAP vs. R/o meningitis.  Assessment Switching ampicillin to Zosyn for coverage of both listeria and pseudomonas. WBC wnl, Temp 100.55F, LA 1.46, PCT neg. Urine Cx neg and blood cultures pending. Unable to obtain LP due to agitation. nCrCl improved to ~ 78 mL/min   Plan: -Start Zosyn 3.375 gm IV Q 8 hours -Increase acyclovir to 700 mg (10 mg/kg) IV Q 8 hours -Increase Vancomycin to 750 mg IV Q 12 hours  -Monitor CBC, renal fx, cultures and clinical progress -VT at SS   Temp (24hrs), Avg:99.3 F (37.4 C), Min:98.6 F (37 C), Max:100.7 F (38.2 C)   Recent Labs Lab 05/15/15 1111 05/16/15 0644 05/17/15 0324  WBC 13.9* 10.8* 10.0    Recent Labs Lab 05/15/15 1111 05/16/15 0644 05/17/15 0324  CREATININE 1.59* 0.98 0.78   Estimated Creatinine Clearance: 89.5 mL/min (by C-G formula based on Cr of 0.78).    Allergies  Allergen Reactions  . Codeine Nausea And Vomiting  . Hydromorphone Itching  . Morphine And Related Itching  . Penicillins Other (See Comments)    Childhood reaction. Tolerated ampicillin in Jan 2017 Has patient had a PCN reaction causing immediate rash, facial/tongue/throat swelling, SOB or lightheadedness with hypotension: No Has patient had a PCN reaction causing severe rash involving mucus membranes or skin necrosis: No Has patient had a PCN reaction that required hospitalization No Has patient had a PCN reaction occurring within the last 10 years: No If all of the above answers are "NO", then may proceed with Cephalosporin use.     Antimicrobials this admission: 1/27 Vanc>> 1/27 Acyclovir>> 1/27 ampicillin>> 1/29 1/29 Zosyn>>  1/27 Cefepime x 1  Levels/dose changes this admission: None  Microbiology results: 1/27 BCx2>>NGTD  1/27 UCx>> Neg  Thank you for  allowing pharmacy to be a part of this patient's care.  Albertina Parr, PharmD., BCPS Clinical Pharmacist Phone [phone]

## 2015-05-17 NOTE — Progress Notes (Signed)
Speech Language Pathology Treatment: Dysphagia  Patient Details Name: Brandi Stephens MRN: [id number] DOB: 04/14/1946 Today's Date: 05/17/2015 Time: [id number] SLP Time Calculation (min) (ACUTE ONLY): 18 min  Assessment / Plan / Recommendation Clinical Impression  SLP called back to pt's room to see if pt could take PO medications safely. She is lethargic and with reduced sustained attention to task, but oral acceptance is good, and she is able to tolerate a more upright position this afternoon. She needs frequent cueing to remain alert and continue adequate bolus preparation prior to posterior transit. Throat clearing and increased wetness noted with ice chips and small amounts of thin liquid. Only one episode of coughing noted with bites of puree. Recommend to remain NPO except for very small amounts of puree to administer crushed medications when fully alert.    HPI HPI: Brandi Stephens is a 70 y.o. female presenting with sepsis due to unknown source and altered mental status. PMH is significant for HTN, lower back pain s/p surgery in December 2016. CT of chest signifcant for Airspace disease in the posterior segment of the right upper lobe could be due to atelectasis but has an appearance worrisome for pneumonia      SLP Plan  Continue with current plan of care     Recommendations  Diet recommendations: NPO Medication Administration: Crushed with puree             Oral Care Recommendations: Oral care QID Follow up Recommendations:  (tba) Plan: Continue with current plan of care     GO               Germain Osgood, M.A. CCC-SLP [phone]  Germain Osgood 05/17/2015, 3:47 PM

## 2015-05-17 NOTE — Progress Notes (Signed)
Family Medicine Teaching Service Daily Progress Note Intern Pager: [phone]  Patient name: Brandi Stephens Medical record number: [id number] Date of birth: Mar 19, 1946 Age: 70 y.o. Gender: female  Primary Care Provider: Harland Dingwall, NP Consultants: Neurosurgery, IR Code Status: Full  Pt Overview and Major Events to Date:  1/27 - Admitted with AMS and agitation  Assessment and Plan: Brandi Stephens is a 70 y.o. female presenting with sepsis due to unknown source and altered mental status. PMH is significant for HTN, lower back pain s/p surgery in December 2016.   Altered Mental Status. Improving. Differential includes meningitis vs sepsis 2/2 pneumonia vs withdrawal from alcohol. No new changes in medications recently.  - Work-up as below for sepsis / infection - Continue CIWA protocol - Will monitor closely  Sepsis: 4/4 SIRS criteria on admission with qSOFA score of 3. Lactic acidosis to 2.59 improved to 1.46 with fluids. CXR was negative for infiltrates. CTA chest was negative for PE, but showed airspace disease in the right upper lobe worrisome for pneumonia. Pro-calcitonin negative. Also at increased risk for meningitis given her recent back surgery in December 2016. CT head negative for acute process.  - Vanc, Zosyn, Ampicillin, Acyclovir (1/27- ). Will de-escalate as appropriate. - Urine and blood cultures pending - Low threshold to consult CCM for low MAPs - Unable to obtain LP due to agitation - unsure how useful it would be after 3 days of IV antibiotics - Neurosurgeon: advised to pursue LP proximal to her fusion sites.  - Fluids per sepsis protocol - Cardiac monitoring and continuous pulse ox  Chronic back pain s/p L4-L5-S1 fusion. Surgery performed 03/20/15. Pt has also been having muscle spasms since her surgery.  - Holding home Baclofen 10mg  daily  Hypertension. Patient hypotensive in the setting of sepsis - Holding home Valsartan-HCTZ 320-12.5mg  in the setting of  sepsis and hypotension  Lower extremity edema. No ECHO in our records - Holding home Lasix 20mg  daily for now in the setting of sepsis and hypotension  Hypothyroidism. TSH 0.787, T4 0.95 - Start IV levothyroxine at half of home dose while patient NPO  Hyperlipidemia - Holding home Lipitor 20mg    Alcohol use. Pt drinks ~10 drinks per week per family. Was also prescribed valium and ativan as home medications. - CIWA - Scheduled Ativan 1mg  q8hrs  FEN/GI: Fluids per sepsis protocol Prophylaxis: Heparin sq  Disposition: Admit to stepdown, attending Dr. McDiarmid  Subjective:  More somnolent this morning due to extra ativan given for head CT. Needed venturi mask due to desat after extra ativan. Follows commands, oriented to person and place. Knows who the current president is. Did have one elevated CIWA score of 23 yesterday and received ativan. CIWA scores 5-10 overnight.   Objective: Temp:  [98.3 F (36.8 C)-99.8 F (37.7 C)] 98.7 F (37.1 C) (01/29 0339) Pulse Rate:  [92-106] 99 (01/29 0600) Resp:  [14-28] 19 (01/29 0600) BP: (93-151)/(44-88) 109/63 mmHg (01/29 0600) SpO2:  [82 %-99 %] 91 % (01/29 0600) FiO2 (%):  [35 %] 35 % (01/29 0350) Physical Exam: General: 70 year old female lying in hospital bed in NAD Cardiovascular: RRR Respiratory: Venturi mask in place. NWOB, CTAB Abdomen: Obese, +BS, nondistended Extremities: No cyanosis or edema noted Neuro: Somnolent, though responds to questions and commands. Oriented to person and place. PERRL. Moves all extremities spontaneously  Laboratory/Imaging/Diagnostic Tests:  Recent Labs Lab 05/15/15 1111 05/16/15 0644 05/17/15 0324  WBC 13.9* 10.8* 10.0  HGB 13.8 12.7 10.9*  HCT 43.3 40.1  35.6*  PLT 255 200 198    Recent Labs Lab 05/15/15 1111 05/16/15 0644 05/17/15 0324  NA 144 146* 140  K 5.0 4.6 4.0  CL 106 113* 107  CO2 24 26 27   BUN 34* 17 8  CREATININE 1.59* 0.98 0.78  CALCIUM 9.7 9.0 8.9  PROT 6.4*  --    --   BILITOT 0.8  --   --   ALKPHOS 78  --   --   ALT 16  --   --   AST 34  --   --   GLUCOSE 131* 135* 131*   Ct Head Wo Contrast  05/17/2015  CLINICAL DATA:  Acute onset of altered mental status and combativeness. Initial encounter. EXAM: CT HEAD WITHOUT CONTRAST TECHNIQUE: Contiguous axial images were obtained from the base of the skull through the vertex without intravenous contrast. COMPARISON:  CT of the head performed 03/07/2013 FINDINGS: There is no evidence of acute infarction, mass lesion, or intra- or extra-axial hemorrhage on CT. Scattered periventricular and subcortical white matter change likely reflects small vessel ischemic microangiopathy. The posterior fossa, including the cerebellum, brainstem and fourth ventricle, is within normal limits. The third and lateral ventricles, and basal ganglia are unremarkable in appearance. The cerebral hemispheres are symmetric in appearance, with normal gray-white differentiation. No mass effect or midline shift is seen. There is no evidence of fracture; visualized osseous structures are unremarkable in appearance. The orbits are within normal limits. The paranasal sinuses and mastoid air cells are well-aerated. No significant soft tissue abnormalities are seen. IMPRESSION: 1. No acute intracranial pathology seen on CT. 2. Scattered small vessel ischemic microangiopathy Electronically Signed   By: Garald Balding M.D.   On: 05/17/2015 03:19   Vivi Barrack, MD 05/17/2015, 7:11 AM PGY-2, [place] Intern pager: [phone], text pages welcome

## 2015-05-17 NOTE — Progress Notes (Signed)
SLP Cancellation Note  Patient Details Name: Brandi Stephens MRN: [id number] DOB: 1946-01-19   Cancelled treatment:       Reason Eval/Treat Not Completed: Fatigue/lethargy limiting ability to participate. Per discussion with RN, pt too lethargic to participate in swallow evaluation after receiving ativan over night. Will continue to follow as able.   Germain Osgood, M.A. CCC-SLP [phone]  Germain Osgood 05/17/2015, 8:44 AM

## 2015-05-18 ENCOUNTER — Inpatient Hospital Stay (HOSPITAL_COMMUNITY): Payer: Medicare HMO

## 2015-05-18 LAB — FOLATE: FOLATE: 16.7 ng/mL (ref >5.9)

## 2015-05-18 LAB — CBC
HEMATOCRIT: 35 % — AB (ref 36.0–46.0)
HEMOGLOBIN: 10.9 g/dL — AB (ref 12.0–15.0)
MCH: 31.5 pg (ref 26.0–34.0)
MCHC: 31.1 g/dL (ref 30.0–36.0)
MCV: 101.2 fL — ABNORMAL HIGH (ref 78.0–100.0)
Platelets: 204 10*3/uL (ref 150–400)
RBC: 3.46 MIL/uL — ABNORMAL LOW (ref 3.87–5.11)
RDW: 14.4 % (ref 11.5–15.5)
WBC: 8.9 10*3/uL (ref 4.0–10.5)

## 2015-05-18 LAB — CSF CELL COUNT WITH DIFFERENTIAL
RBC COUNT CSF: 143 /mm3 — AB
TUBE #: 4
WBC, CSF: 0 /mm3 (ref 0–5)

## 2015-05-18 LAB — FERRITIN: Ferritin: 89 ng/mL (ref 11–307)

## 2015-05-18 LAB — BASIC METABOLIC PANEL
Anion gap: 5 (ref 5–15)
BUN: <5 mg/dL — ABNORMAL LOW (ref 6–20)
CHLORIDE: 108 mmol/L (ref 101–111)
CO2: 29 mmol/L (ref 22–32)
CREATININE: 0.73 mg/dL (ref 0.44–1.00)
Calcium: 9 mg/dL (ref 8.9–10.3)
GFR calc non Af Amer: >60 mL/min (ref >60)
Glucose, Bld: 127 mg/dL — ABNORMAL HIGH (ref 65–99)
POTASSIUM: 3.9 mmol/L (ref 3.5–5.1)
Sodium: 142 mmol/L (ref 135–145)

## 2015-05-18 LAB — RETICULOCYTES
RBC.: 3.46 MIL/uL — AB (ref 3.87–5.11)
Retic Count, Absolute: 65.7 10*3/uL (ref 19.0–186.0)
Retic Ct Pct: 1.9 % (ref 0.4–3.1)

## 2015-05-18 LAB — PROTEIN, CSF: TOTAL PROTEIN, CSF: 41 mg/dL (ref 15–45)

## 2015-05-18 LAB — IRON AND TIBC
IRON: 18 ug/dL — AB (ref 28–170)
SATURATION RATIOS: 7 % — AB (ref 10.4–31.8)
TIBC: 263 ug/dL (ref 250–450)
UIBC: 245 ug/dL

## 2015-05-18 LAB — VITAMIN B12: Vitamin B-12: 452 pg/mL (ref 180–914)

## 2015-05-18 LAB — AMMONIA: Ammonia: 42 umol/L — ABNORMAL HIGH (ref 9–35)

## 2015-05-18 LAB — GLUCOSE, CSF: Glucose, CSF: 84 mg/dL — ABNORMAL HIGH (ref 40–70)

## 2015-05-18 MED ORDER — LIDOCAINE HCL (PF) 1 % IJ SOLN
INTRAMUSCULAR | Status: AC
  Administered 2015-05-18: 5 mL via INTRAMUSCULAR
  Filled 2015-05-18: qty 5

## 2015-05-18 MED ORDER — KETOROLAC TROMETHAMINE 30 MG/ML IJ SOLN
30.0000 mg | Freq: Every day | INTRAMUSCULAR | Status: DC
  Administered 2015-05-18 – 2015-05-20 (×3): 30 mg via INTRAVENOUS
  Filled 2015-05-18 (×4): qty 1

## 2015-05-18 NOTE — Care Management Important Message (Signed)
Important Message  Patient Details  Name: Brandi Stephens MRN: [id number] Date of Birth: 1945-12-28   Medicare Important Message Given:  Yes    Nathen May 05/18/2015, 2:19 PM

## 2015-05-18 NOTE — Progress Notes (Signed)
PT Cancellation Note  Patient Details Name: Brandi Stephens MRN: [id number] DOB: April 15, 1946   Cancelled Treatment:    Reason Eval/Treat Not Completed: Other (comment)   Family requesting PT to return later;   Will follow up later today as time allows;  Otherwise, will follow up for PT tomorrow;   Thank you,  Roney Marion, [place] Pager [phone] Office [phone]     Roney Marion [facility] 05/18/2015, 8:52 AM

## 2015-05-18 NOTE — Progress Notes (Signed)
SLP Cancellation Note  Patient Details Name: Brandi Stephens MRN: [id number] DOB: 07/07/1945   Cancelled treatment:        Pt to have LP today, then flat for 4 hours. Will see next date to re-attempt food/liquids.   Houston Siren 05/18/2015, 9:49 AM  Orbie Pyo Colvin Caroli.Ed [employer] [phone]

## 2015-05-18 NOTE — Progress Notes (Addendum)
Pt going to IR for LP - have been trying to complete this since 1/28 Pt was uncooperative and unable to lay still - medicated with 2 MG Ativan IV just prior to transport for attempted LP today in IR

## 2015-05-18 NOTE — Progress Notes (Addendum)
Pt flat for 4 hours post LP may have HOB up at 5:30 pm. Pt aware Husband not here went to eat. Made a sign to hang over the bed with instructions for not having head up Bandaid intact on back no hematoma site clean and dry .

## 2015-05-18 NOTE — Progress Notes (Signed)
Family Medicine Teaching Service Daily Progress Note Intern Pager: [phone]  Patient name: Brandi Stephens Medical record number: [id number] Date of birth: 28-Nov-1945 Age: 70 y.o. Gender: female  Primary Care Provider: Harland Dingwall, NP Consultants: Neurosurgery, IR Code Status: Full  Pt Overview and Major Events to Date:  1/27 - Admitted with AMS and agitation  Assessment and Plan: Brandi Stephens is a 70 y.o. female presenting with sepsis due to unknown source and altered mental status. PMH is significant for HTN, lower back pain s/p surgery in December 2016.   Sepsis secondary to CAP: With concern for meningitis. 4/4 SIRS criteria on admission with qSOFA score of 3. Pt spiked a fever to 100.7 yesterday afternoon, but has otherwise been afebrile. HRs and BPs have normalized. Required 5L O2 by [place] overnight for desaturation to 79%.  - Vanc, Zosyn, Acyclovir (1/27- ) - Blood cultures: no growth x 3 days - Urine culture: multiple species, suggest recollection - Fluoro-guided lumbar puncture today - Neurosurgery: advised to pursue LP proximal to her fusion sites.  - Cardiac monitoring and continuous pulse ox - Vitals per unit routine  Altered Mental Status. Improving. Differential includes meningitis vs sepsis 2/2 pneumonia vs withdrawal from alcohol and opiates vs recent Prednisone use. No new changes in medications recently. CIWAs initially 23, but have come down to 4 and 3. Ammonia 42. - Work-up as above for sepsis / infection - Scheduled Ativan 1mg  bid + Ativan prn for CIWA - Continue CIWA protocol - Will monitor closely  Acute gout flare: Pt complaining of gout-like pain in her right foot - She is currently NPO so will give Toradol 30mg  IV daily for now.  Macrocytic Anemia: Hgb 10.9, MCV 101.2. B12 and folate were both normal, so likely secondary to alcohol use. Iron panel: Iron 18 (L), TIBC 263 (nl), ferritin 89 (nl). Reticulocyte count normal. - Monitor with daily  CBCs  Chronic back pain s/p L4-L5-S1 fusion. Surgery performed 03/20/15. Pt has also been having muscle spasms since her surgery.  - Holding home Baclofen 10mg  daily - Continue home Gabapentin 100mg  tid  Hypertension. BPs initially low, but have normalized. - Holding home Valsartan-HCTZ 320-12.5mg  in the setting of sepsis   Lower extremity edema. No ECHO in our records - Holding home Lasix 20mg  daily for now in the setting of sepsis   Hypothyroidism. TSH 0.787, T4 0.95. - Start IV levothyroxine at half of home dose while patient NPO  Hyperlipidemia - Holding home Lipitor 20mg    Alcohol use. Pt drinks ~10 drinks per week per family. Was also prescribed valium and ativan as home medications. - CIWA - Scheduled Ativan 1mg  bid  FEN/GI: D5 1/2 NS at 145ml/hr; Pt has failed swallow studies x 2. Prophylaxis: Heparin sq  Disposition: Continued inpatient management required for sepsis, AMS, and hypoxemia.  Subjective:  Pt states she feels much better today. She knows she has failed 2 swallow studies, but would like to try again because she wants to eat something.  Objective: Temp:  [98.5 F (36.9 C)-100.7 F (38.2 C)] 98.7 F (37.1 C) (01/30 0400) Pulse Rate:  [84-101] 87 (01/30 0617) Resp:  [14-23] 18 (01/30 0617) BP: (110-143)/(40-87) 123/71 mmHg (01/30 0617) SpO2:  [79 %-99 %] 95 % (01/30 0617) FiO2 (%):  [35 %] 35 % (01/29 1944) Physical Exam: General: 70 year old female lying in hospital bed in NAD Cardiovascular: RRR Respiratory: [place] in place. NWOB, CTAB Abdomen: Obese, +BS, nondistended Extremities: No cyanosis or edema noted, ice pack present  over right foot Neuro: Awake, alert, answers questions appropriately, moves all extremities spontaneously  Laboratory/Imaging/Diagnostic Tests:  Recent Labs Lab 05/16/15 0644 05/17/15 0324 05/18/15 0410  WBC 10.8* 10.0 8.9  HGB 12.7 10.9* 10.9*  HCT 40.1 35.6* 35.0*  PLT 200 198 204    Recent Labs Lab 05/15/15 1111  05/16/15 0644 05/17/15 0324 05/18/15 0410  NA 144 146* 140 142  K 5.0 4.6 4.0 3.9  CL 106 113* 107 108  CO2 24 26 27 29   BUN 34* 17 8 <5*  CREATININE 1.59* 0.98 0.78 0.73  CALCIUM 9.7 9.0 8.9 9.0  PROT 6.4*  --   --   --   BILITOT 0.8  --   --   --   ALKPHOS 78  --   --   --   ALT 16  --   --   --   AST 34  --   --   --   GLUCOSE 131* 135* 131* 127*   CXR: negative for infiltrates CTA chest: negative for PE, airspace disease in the right upper lobe worrisome for pneumonia. CT head: negative for acute process   Sela Hua, MD 05/18/2015, 6:41 AM PGY-1, [place] Intern pager: [phone], text pages welcome

## 2015-05-18 NOTE — Plan of Care (Signed)
Problem: Fluid Volume: Goal: Hemodynamic stability will improve Outcome: Progressing Patient has increased edema to the right leg and ankle (Only new medication added back has been her Gabapentin).  Elevated extremity.

## 2015-05-19 MED ORDER — LEVOFLOXACIN 750 MG PO TABS: 750.0000 mg | ORAL_TABLET | Freq: Every day | ORAL | Status: DC

## 2015-05-19 MED ORDER — DIPHENHYDRAMINE HCL 25 MG PO CAPS
25.0000 mg | ORAL_CAPSULE | Freq: Once | ORAL | Status: AC
  Administered 2015-05-20: 25 mg via ORAL
  Filled 2015-05-19: qty 1

## 2015-05-19 MED ORDER — THIAMINE HCL 100 MG/ML IJ SOLN: 100.0000 mg | Freq: Every day | INTRAMUSCULAR | Status: DC

## 2015-05-19 MED ORDER — SODIUM CHLORIDE 0.9 % IV SOLN
500.0000 mg | Freq: Three times a day (TID) | INTRAVENOUS | Status: DC
Start: 2015-05-19 — End: 2015-05-21
  Administered 2015-05-19 – 2015-05-21 (×6): 500 mg via INTRAVENOUS
  Filled 2015-05-19 (×12): qty 5

## 2015-05-19 MED ORDER — LEVOFLOXACIN 750 MG PO TABS
750.0000 mg | ORAL_TABLET | Freq: Every day | ORAL | Status: AC
  Administered 2015-05-20 – 2015-05-21 (×2): 750 mg via ORAL
  Filled 2015-05-19 (×2): qty 1

## 2015-05-19 NOTE — Discharge Summary (Signed)
[facility] Discharge Summary  Patient name: Brandi Stephens Medical record number: [id number] Date of birth: [birth year] Age: 70 y.o. Gender: female Date of Admission: 05/15/2015  Date of Discharge: 05/21/2015  Admitting Physician: Blane Ohara McDiarmid, MD  Primary Care Provider: Harland Dingwall, NP Consultants: Neurosurgery  Indication for Hospitalization: AMS, vital sign abnormalities with concern for sepsis  Discharge Diagnoses/Problem List:  Patient Active Problem List   Diagnosis Date Noted  . Acute delirium 05/17/2015  . HCAP (healthcare-associated pneumonia)   . Macrocytosis   . 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 Altered mental status   . Sepsis ([place]) 05/15/2015  . Lumbar spondylosis 03/20/2015  . Paresthesias 04/01/2013  . Tremor 03/19/2013    Disposition: SNF  Discharge Condition: improved  Discharge Exam:  General: Sitting up in bed in NAD; daughter at bedside Cardiovascular: RRR, no murmurs appreciated Respiratory: Normal work of breathing on RA, faint wheezes Abdomen: soft, non-tender, non-distended, +BS Extremities: 2+ lower extremity edema bilaterally, no TTP of toe Neuro: A&Ox4, no focal deficits  Brief Hospital Course:  Brandi Stephens is a 70 year old female who presented to the ED with altered mental status and agitation. She had recently had an L4-L5-S1 spinal fusion 1 month prior. In the ED, she was meeting 4/4 SIRS criteria with hypotension to 90s/50s, fever to 101.3, tachycardia to 112, and tachypnea to 24. She initially required non-rebreather for O2 saturations to 88% on 5L [place], but was subsequently transitioned back to nasal cannula. CXR was negative for infiltrates. CTA chest was performed and was negative for PE, but showed airspace disease in the right upper lobe worrisome for pneumonia. She was started on fluids per sepsis protocol and was treated with Vancomycin and Zosyn. Hospital course is described by problem list  below.  Vital sign abnormalities likely 2/2 withdrawal from medications and alcohol with concern for sepsis: VS abnormalities were thought to be 2/2 CAP, but we were worried about possible meningitis, given her altered mental status and recent back surgery. We added Ampicillin and Acyclovir to her Vanc/Zosyn. We consulted the neurosurgeon, who recommended fluoroscopy-guided lumbar puncture above the fusion sites. The lumbar puncture could not be performed until after she had been on antibiotics for 3 days, but the CSF studies showed 0 WBCs, mildly elevated glucose, normal protein. CSF gram stain was negative for organisms. Blood and CSF cultures did not grow anything. After the CSF results, we were less concerned for meningitis and we transitioned her to Levaquin for a total 7 day course to cover CAP. As patient has 20+ year smoking history, it was thought that COPD may also have contributed to patient's sepsis breathing difficulties. She previously taking only QVAR for COPD, so Spiriva was added. She received one day of prednisone 50 mg, then prednisone burst was discontinued, as likely benefits were minimal at that point. Patient was concerned that she had not entirely regained her strength prior to discharge, and requested SNF placement.   Altered mental status: In the ED, Ms. Seabourn was oriented only to person. She was visibly agitated and would not answer questions. She kept trying to pull out her IV. She initially failed two swallow studies because she was so out of it. After administration of fluids and antibiotics, her mental status improved. We thought her AMS was likely secondary to sepsis in combination with narcotic use, benzodiazepine use, barbiturate use, and alcohol abuse. We stopped her home Valium, Percocet, and Primidone. We continued her on scheduled Ativan so that  she would not go into benzo withdrawal. We also placed her on CIWA protocol. Her CIWA scores were initially as high as 23, but  subsequently trended down. Her mental status and agitation improved, but she remained a little confused. We started her on high dose Thiamine to see if that would help her mental status. Mental status continued to improve, and patient had returned to baseline by time of discharge. She was very concerned that a similar situation might occur again, and requested SNF placement upon discharge. Multiple medications with possible neurocognitive side effects were discontinued at discharge, and Ativan wean was started.   Issues for Follow Up:  1. Multiple changes were made to patient's medication regimen during admission. They are as follows: 1. Spiriva started in addition to QVAR for COPD.  2. Ativan wean started. Patient instructed to take 0.5 mg BID for one week, then decrease to 0.25 mg BID for one week, then discontinue all together.  3. Baclofen for back pain was discontinued. Patient can continue gabapentin 100 mg TID for back pain, as her pain was well-controlled on this alone during admission.  4. Patient was hypotensive to normotensive throughout admission, so Diovan was held, and was not resumed on discharge. Can consider resuming if patient becomes hypertensive again.  5. Patient's LE edema improved without Lasix, so this was discontinued on discharge.  6. Lipitor was increased from 20 mg qd to 40 mg qd.  7. Valium was discontinued.   Significant Procedures: LP  Significant Labs and Imaging:   Recent Labs Lab 05/18/15 0410 05/20/15 0820 05/21/15 0707  WBC 8.9 7.2 8.0  HGB 10.9* 11.1* 11.7*  HCT 35.0* 34.5* 36.8  PLT 204 220 226    Recent Labs Lab 05/15/15 1111 05/15/15 1925 05/16/15 0644 05/17/15 0324 05/18/15 0410 05/20/15 0820  NA 144  --  146* 140 142 143  K 5.0  --  4.6 4.0 3.9 3.9  CL 106  --  113* 107 108 107  CO2 24  --  26 27 29 29   GLUCOSE 131*  --  135* 131* 127* 130*  BUN 34*  --  17 8 <5* 5*  CREATININE 1.59*  --  0.98 0.78 0.73 0.95  CALCIUM 9.7  --  9.0  8.9 9.0 8.6*  MG  --  1.8  --   --   --   --   PHOS  --  4.0  --   --   --   --   ALKPHOS 78  --   --   --   --   --   AST 34  --   --   --   --   --   ALT 16  --   --   --   --   --   ALBUMIN 3.3*  --   --   --   --   --     Results/Tests Pending at Time of Discharge: none  Discharge Medications:    Medication List    STOP taking these medications        baclofen 10 MG tablet  Commonly known as:  LIORESAL     dextromethorphan-guaiFENesin 30-600 MG 12hr tablet  Commonly known as:  MUCINEX DM     diazepam 5 MG tablet  Commonly known as:  VALIUM     diphenhydramine-acetaminophen 25-500 MG Tabs tablet  Commonly known as:  TYLENOL PM     furosemide 20 MG tablet  Commonly known as:  LASIX  oxyCODONE-acetaminophen 5-325 MG tablet  Commonly known as:  PERCOCET/ROXICET     valsartan-hydrochlorothiazide 320-12.5 MG tablet  Commonly known as:  DIOVAN-HCT      TAKE these medications        allopurinol 100 MG tablet  Commonly known as:  ZYLOPRIM  Take 1 tablet by mouth daily.     atorvastatin 40 MG tablet  Commonly known as:  LIPITOR  Take 1 tablet (40 mg total) by mouth daily.     beclomethasone 80 MCG/ACT inhaler  Commonly known as:  QVAR  Inhale 2 puffs into the lungs 2 (two) times daily.     gabapentin 100 MG capsule  Commonly known as:  NEURONTIN  Take 100-300 mg by mouth 3 (three) times daily. 100 mg 3 times daily and 300 mg at bedtime     KLOR-CON 10 10 MEQ tablet  Generic drug:  potassium chloride  Take 10 mEq by mouth as needed.     levothyroxine 75 MCG tablet  Commonly known as:  SYNTHROID, LEVOTHROID  Take 75 mcg by mouth daily.     LORazepam 0.5 MG tablet  Commonly known as:  ATIVAN  Take 1 tablet (0.5 mg) twice a day for one week. Decrease to one-half tablet (0.25 mg) twice a day for one week, then discontinue medication.     MAGNESIUM CITRATE PO  Take 2 tablets by mouth at bedtime. Using for constipation     MIRALAX packet  Generic drug:   polyethylene glycol  Take 17 g by mouth at bedtime as needed.     primidone 50 MG tablet  Commonly known as:  MYSOLINE  Take 0.5 tablets (25 mg total) by mouth at bedtime.     tiotropium 18 MCG inhalation capsule  Commonly known as:  SPIRIVA  Place 1 capsule (18 mcg total) into inhaler and inhale daily.        Discharge Instructions: Please refer to Patient Instructions section of EMR for full details.  Patient was counseled important signs and symptoms that should prompt return to medical care, changes in medications, dietary instructions, activity restrictions, and follow up appointments.   Follow-Up Appointments: Follow-up Information    Follow up with Harland Dingwall, NP. Schedule an appointment as soon as possible for a visit in 1 week.   Specialty:  Family Medicine   Why:  For hospital follow-up   Contact information:   [place] [address] [phone]       Verner Mould, MD 05/21/2015, 11:40 AM PGY-1, [facility]  Note completed by Verner Mould, MD and signed by Kerrin Mo, MD

## 2015-05-19 NOTE — Progress Notes (Signed)
SATURATION QUALIFICATIONS: (This note is used to comply with regulatory documentation for home oxygen)  Patient Saturations on Room Air at Rest = 86%  Patient Saturations on Room Air while Ambulating = 88%  Patient Saturations on 4 Liters of oxygen while Ambulating = 88-94%  Please briefly explain why patient needs home oxygen:Pt desats without O2 at current time. May need home O2. Thanks.  Heil [phone] (pager)

## 2015-05-19 NOTE — Plan of Care (Signed)
Problem: Respiratory: Goal: Ability to maintain adequate ventilation will improve Outcome: Progressing Tried to decrease the patient's oxygen liters from 4 to 3 but was unable to keep her SpO2%>89% when lowered.

## 2015-05-19 NOTE — Clinical Social Work Note (Signed)
Clinical Social Work Assessment  Patient Details  Name: Brandi Stephens MRN: [id number] Date of Birth: 02-06-1946  Date of referral:  05/19/15               Reason for consult:  Facility Placement                Permission sought to share information with:  Facility Sport and exercise psychologist, Family Supports Permission granted to share information::  Yes, Verbal Permission Granted  Name::        Agency::     Relationship::     Contact Information:     Housing/Transportation Living arrangements for the past 2 months:  Single Family Home Source of Information:  Patient Patient Interpreter Needed:  None Criminal Activity/Legal Involvement Pertinent to Current Situation/Hospitalization:  No - Comment as needed Significant Relationships:  Spouse, Adult Children Ingra Confer, 2 daughters) Lives with:  Spouse, Adult Children Do you feel safe going back to the place where you live?  Yes Need for family participation in patient care:  No (Coment)  Care giving concerns:  Patient feels she can return back home with husband and two daughter but they can not provide 24 hour assistance to patient medical needs. PT recommend SNF.   Social Worker assessment / plan:  BSW intern enters patients room. Patient is alert and oriented to person only, so BSW intern completed the  assessment with daughter and father via telephone. with best friend near bedside.  BSW intern went on to explain PT recommendations as well as the referral process. Patient live home with husband who also has to have back surgery done soon. Patient recently has had an 8 hour back surgery and a mixture of pneumonia and bronchitis which led her back into the hospital due to how sick she became. Patient daughter and husband can not provide that 24 hour assistance at home. Patient is not refusing SNF but she is not very accepting of it either. Patient states that she would like to go home with home health. Patient stated that she has had  home health before with advance home care and would like to set up the same service again but her daughter [name]) and her husband Herbie [place]) does not think that would be acceptable due to limited assistance (he will be getting back surgery shortly and will not be able to help physically).   BSW intern left a list of facilities with patient for patient daughter and husband to review when they come. Patient daughter says her mom only need short-term care until she is able to return home.  Employment status:  Disabled (Comment on whether or not currently receiving Disability) Insurance information:    PT Recommendations:  [place] / Referral to community resources:  [place]  Patient/Family's Response to care:  Patient and patient family  response to care is very understanding of current plan.  Patient/Family's Understanding of and Emotional Response to Diagnosis, Current Treatment, and Prognosis:  Patient and patient family is understanding of current treatment plan and diagnosis.  Emotional Assessment Appearance:  Appears older than stated age Attitude/Demeanor/Rapport:   (welcoming, nice,) Affect (typically observed):  Pleasant, Accepting Orientation:  Oriented to Place, Oriented to Self, Oriented to  Time, Oriented to Situation Alcohol / Substance use:  Tobacco Use, Alcohol Use (former smoker, 0.6oz and 1 glass of wine per week) Psych involvement (Current and /or in the community):  No (Comment)  Discharge Needs  Concerns to be addressed:  No  discharge needs identified Readmission within the last 30 days:  No Current discharge risk:  None Barriers to Discharge:  No Barriers Identified  [place] intern  [phone]  CSW reviewed the above assessment and agrees with its findings.  Domenica Reamer, [place] Social Worker [phone]

## 2015-05-19 NOTE — Progress Notes (Signed)
Family Medicine Teaching Service Daily Progress Note Intern Pager: [phone]  Patient name: Brandi Stephens Medical record number: [id number] Date of birth: [birth year] Age: 70 y.o. Gender: female  Primary Care Provider: Harland Dingwall, NP Consultants: Neurosurgery, IR Code Status: Full  Pt Overview and Major Events to Date:  1/27 - Admitted with AMS and agitation 1/30 - LP performed, not consistent with infection  Assessment and Plan: Brandi Stephens is a 70 y.o. female presenting with sepsis due to unknown source and altered mental status. PMH is significant for HTN, lower back pain s/p surgery in December 2016.   Sepsis secondary to CAP: With concern for meningitis. LP performed yesterday: 0 WBC, rare neutrophils, mildly elevated glucose (84), normal protein. CSF gram stain showing PMNs but no organisms. Afebrile overnight. Stable on 4L O2 overnight. - Vanc, Zosyn, Acyclovir (1/27- ). Will d/c Acyclovir this morning. If she passes her swallow eval, will switch to Levaquin for a total 7 day course (end date: 2/2). - Will start Pulmicort bid today - Blood cultures: no growth x 3 days - CSF culture: pending - Cardiac monitoring and continuous pulse ox - Vitals per unit routine - Out of bed today - If she passes her swallow eval and her CIWA scores remain low, will consider transfer out to the floor later today or tomorrow.  Altered Mental Status. Improving. Differential includes meningitis vs sepsis 2/2 pneumonia vs withdrawal from alcohol and opiates vs recent Prednisone use. No new changes in medications recently. CIWAs 3, 10, 4, 0 overnight.  - Trial of high dose thiamine IV to see if this helps her AMS - Work-up as above for sepsis / infection - Remains NPO- speech could not evaluate her yesterday. If she is unable to swallow, will get MRI brain - Scheduled Ativan 1mg  bid + Ativan prn for CIWA - Continue CIWA protocol - Will monitor closely  Acute gout flare: Improved. Pt  complaining of gout-like pain in her right foot. - Toradol 30mg  IV daily for now.  Macrocytic Anemia: Hgb 10.9, MCV 101.2. B12 and folate were both normal, so likely secondary to alcohol use. Iron panel: Iron 18 (L), TIBC 263 (nl), ferritin 89 (nl). Reticulocyte count normal. - Monitor with daily CBCs  Chronic back pain s/p L4-L5-S1 fusion. Surgery performed 03/20/15. Pt has also been having muscle spasms since her surgery.  - Holding home Baclofen 10mg  daily - Continue home Gabapentin 100mg  tid  Hypertension. BPs initially low, but have normalized. - Holding home Valsartan-HCTZ 320-12.5mg     Lower extremity edema. No ECHO in our records - Holding home Lasix 20mg  daily for now   Hypothyroidism. TSH 0.787, T4 0.95. - Start IV levothyroxine at half of home dose while patient NPO  Hyperlipidemia - Holding home Lipitor 20mg    Alcohol use. Pt drinks ~10 drinks per week per family. Was also prescribed valium and ativan as home medications. - CIWA - Scheduled Ativan 1mg  bid  FEN/GI: D5 1/2 NS at 127ml/hr; Pt has failed swallow studies x 2. Will receive another swallow evaluation today. Prophylaxis: Heparin sq  Disposition: Continued inpatient management required for sepsis, AMS, and hypoxemia.  Subjective:  Pt states she is doing better this morning. She has no concerns.  Objective: Temp:  [98 F (36.7 C)-98.8 F (37.1 C)] 98.4 F (36.9 C) (01/31 0400) Pulse Rate:  [69-95] 90 (01/31 0600) Resp:  [13-29] 17 (01/31 0600) BP: (104-141)/(60-99) 128/75 mmHg (01/31 0600) SpO2:  [89 %-98 %] 96 % (01/31 0600) Physical Exam: General: Tired-appearing  but non-toxic, laying in bed, in NAD Cardiovascular: RRR Respiratory: [place] in place. NWOB, CTAB Abdomen: Obese, +BS, nondistended Extremities: No cyanosis or edema noted, right foot is tender to palpation. Neuro: Awake, alert, answers questions appropriately, moves all extremities spontaneously  Laboratory/Imaging/Diagnostic  Tests:  Recent Labs Lab 05/16/15 0644 05/17/15 0324 05/18/15 0410  WBC 10.8* 10.0 8.9  HGB 12.7 10.9* 10.9*  HCT 40.1 35.6* 35.0*  PLT 200 198 204    Recent Labs Lab 05/15/15 1111 05/16/15 0644 05/17/15 0324 05/18/15 0410  NA 144 146* 140 142  K 5.0 4.6 4.0 3.9  CL 106 113* 107 108  CO2 24 26 27 29   BUN 34* 17 8 <5*  CREATININE 1.59* 0.98 0.78 0.73  CALCIUM 9.7 9.0 8.9 9.0  PROT 6.4*  --   --   --   BILITOT 0.8  --   --   --   ALKPHOS 78  --   --   --   ALT 16  --   --   --   AST 34  --   --   --   GLUCOSE 131* 135* 131* 127*   Ammonia 42  CXR: negative for infiltrates CTA chest: negative for PE, airspace disease in the right upper lobe worrisome for pneumonia. CT head: negative for acute process   Sela Hua, MD 05/19/2015, 6:48 AM PGY-1, [place] Intern pager: [phone], text pages welcome

## 2015-05-19 NOTE — Evaluation (Signed)
Physical Therapy Evaluation Patient Details Name: Brandi Stephens MRN: [id number] DOB: 1945/12/04 Today's Date: 05/19/2015   History of Present Illness  Brandi Stephens is a 70 y.o. female presenting with sepsis due to unknown source and altered mental status. PMH is significant for HTN, lower back pain s/p surgery in December 2016. CT of chest signifcant for Airspace disease in the posterior segment of the right upper lobe could be due to atelectasis but has an appearance worrisome for pneumonia   Clinical Impression  Pt admitted with above diagnosis. Pt currently with functional limitations due to the deficits listed below (see PT Problem List). Pt able to ambulate with rW with fair stability.  Some safety issues.  Pts daughter reports pt was struggling at home and husband cannot physically assist and daughter can only be there 3-4 hours day.  Pt also has a lot of stairs at home to get upstairs.  Given her struggles PTA, would benefit from SNF.  Pt seems to be in agreement for short term SNF stay. Will  Follow acutely. Pt will benefit from skilled PT to increase their independence and safety with mobility to allow discharge to the venue listed below.      Follow Up Recommendations SNF;Supervision/Assistance - 24 hour    Equipment Recommendations  None recommended by PT    Recommendations for Other Services       Precautions / Restrictions Precautions Precautions: Fall;Back Restrictions Weight Bearing Restrictions: No      Mobility  Bed Mobility Overal bed mobility: Needs Assistance;+2 for physical assistance Bed Mobility: Rolling;Sidelying to Sit Rolling: Min assist Sidelying to sit: Min assist       General bed mobility comments: Cues for log roll.  Used rail as well.  Incr time for pt to complete.   Transfers Overall transfer level: Needs assistance Equipment used: Rolling walker (2 wheeled) Transfers: Sit to/from Stand Sit to Stand: Min guard;+2 safety/equipment          General transfer comment: Pt needed cues for hand placement.  Ambulation/Gait Ambulation/Gait assistance: Min guard Ambulation Distance (Feet): 200 Feet (100 feet x 2) Assistive device: Rolling walker (2 wheeled) Gait Pattern/deviations: Step-through pattern;Decreased stride length;Shuffle;Drifts right/left   Gait velocity interpretation: Below normal speed for age/gender General Gait Details: Pt used RW needing cues to slow down as she varied in walking quickly vs. would slow down and appeared imbalanced at times.    Stairs            Wheelchair Mobility    Modified Rankin (Stroke Patients Only)       Balance Overall balance assessment: Needs assistance;History of Falls Sitting-balance support: No upper extremity supported;Feet supported Sitting balance-Leahy Scale: Fair     Standing balance support: Bilateral upper extremity supported;During functional activity Standing balance-Leahy Scale: Poor Standing balance comment: Pt was able to stand with RW but relies on RW for support in static standing.                              Pertinent Vitals/Pain Pain Assessment: 0-10 Pain Score: 4  Pain Location: back with movement Pain Descriptors / Indicators: Aching;Grimacing;Guarding Pain Intervention(s): Limited activity within patient's tolerance;Monitored during session;Repositioned  75bpm, 95% on 4LO2, 86% on RA.  88% on 4LO2 with ambulation- encouraged pursed lip breathing.  132/85.      Home Living Family/patient expects to be discharged to:: Private residence Living Arrangements: Spouse/significant other Available Help at Discharge: Family;Available 24  hours/day (husband cannot physically assist) Type of Home: House Home Access: Stairs to enter Entrance Stairs-Rails: Right;Left (front rails; back no rails; pt uses back entrance) Entrance Stairs-Number of Steps: 6 (6 front; 4 back) Home Layout: Two level;Bed/bath upstairs;1/2 bath on main  level Home Equipment: Walker - 2 wheels;Bedside commode;Shower seat Additional Comments: Pt had not been going up and down flight to upstairs due to pain.     Prior Function Level of Independence: Independent with assistive device(s)         Comments: Pt was ambulating in home with RW after back surgery but would have good and bad days due to pain.      Hand Dominance        Extremity/Trunk Assessment   Upper Extremity Assessment: Defer to OT evaluation           Lower Extremity Assessment: Generalized weakness      Cervical / Trunk Assessment: Normal  Communication   Communication: No difficulties  Cognition Arousal/Alertness: Awake/alert Behavior During Therapy: Flat affect Overall Cognitive Status: Impaired/Different from baseline Area of Impairment: Following commands;Safety/judgement;Awareness;Problem solving     Memory: Decreased short-term memory;Decreased recall of precautions (had to cue for log roll) Following Commands: Follows one step commands inconsistently;Follows one step commands with increased time Safety/Judgement: Decreased awareness of safety;Decreased awareness of deficits Awareness: Intellectual Problem Solving: Slow processing;Requires verbal cues;Difficulty sequencing      General Comments General comments (skin integrity, edema, etc.): Bil LE edema    Exercises General Exercises - Lower Extremity Ankle Circles/Pumps: AROM;Both;10 reps;Seated Long Arc Quad: AROM;Both;10 reps;Seated      Assessment/Plan    PT Assessment Patient needs continued PT services  PT Diagnosis Generalized weakness;Acute pain   PT Problem List Decreased activity tolerance;Decreased balance;Decreased mobility;Decreased knowledge of use of DME;Decreased safety awareness;Decreased knowledge of precautions;Pain  PT Treatment Interventions Gait training;DME instruction;Stair training;Functional mobility training;Therapeutic activities;Therapeutic exercise;Balance  training;Cognitive remediation;Patient/family education   PT Goals (Current goals can be found in the Care Plan section) Acute Rehab PT Goals Patient Stated Goal: to get better PT Goal Formulation: With patient Time For Goal Achievement: 06/02/15 Potential to Achieve Goals: Good    Frequency Min 3X/week   Barriers to discharge Decreased caregiver support (husband is having back surgery and daughter avail 3-4 hrs)      Co-evaluation               End of Session Equipment Utilized During Treatment: Gait belt;Oxygen Activity Tolerance: Patient limited by fatigue Patient left: in chair;with call bell/phone within reach;with chair alarm set;with family/visitor present Nurse Communication: Mobility status         Time: [id number] PT Time Calculation (min) (ACUTE ONLY): 30 min   Charges:   PT Evaluation $PT Eval Moderate Complexity: 1 Procedure PT Treatments $Gait Training: 8-22 mins   PT G CodesDenice Paradise 2015/06/14, 12:24 PM Franz Svec,PT Acute Rehabilitation [phone] (pager)

## 2015-05-19 NOTE — Progress Notes (Signed)
Report given to [facility] on 6N at this time.

## 2015-05-19 NOTE — Progress Notes (Signed)
Speech Language Pathology Treatment: Dysphagia  Patient Details Name: ROMIE CRISSEY MRN: [id number] DOB: August 04, 1945 Today's Date: 05/19/2015 Time: [id number] SLP Time Calculation (min) (ACUTE ONLY): 20 min  Assessment / Plan / Recommendation Clinical Impression  Pt alert and pleasant. Delayed cough x 2 following straw sips thin water (penetration vs pna). She required moderate verbal cues for slower pace and decreased volume effective in mitigating s/s aspiration. Oral phase functional with regular texture. Recommend regular texture diet and thin liquids, pills whole in applesauce, straw allowed with small sips, slow pace. No follow up needed at this time.   HPI HPI: XIARA BRADT is a 69 y.o. female presenting with sepsis due to unknown source and altered mental status. PMH is significant for HTN, lower back pain s/p surgery in December 2016. CT of chest signifcant for Airspace disease in the posterior segment of the right upper lobe could be due to atelectasis but has an appearance worrisome for pneumonia      SLP Plan  Discharge SLP treatment due to (comment)     Recommendations  Diet recommendations: Regular;Thin liquid Liquids provided via: Cup;Straw Medication Administration: Whole meds with puree Supervision: Patient able to self feed;Intermittent supervision to cue for compensatory strategies Compensations: Small sips/bites;Slow rate Postural Changes and/or Swallow Maneuvers: Seated upright 90 degrees             Oral Care Recommendations: Oral care BID Follow up Recommendations: None Plan: Discharge SLP treatment due to (comment)     GO                Houston Siren 05/19/2015, 1:46 PM   Orbie Pyo Colvin Caroli.Ed [employer] [phone]

## 2015-05-19 NOTE — NC FL2 (Signed)
[place] LEVEL OF CARE SCREENING TOOL     IDENTIFICATION  Patient Name: Brandi Stephens Birthdate: 04/14/1946 Sex: female Admission Date (Current Location): 05/15/2015  [facility] [place] Number:  Herbalist and Address:  The . [facility] [place] [address], [place], [place] [address]      Provider Number: [id number]  Attending Physician Name and Address:  Blane Ohara McDiarmid, MD  Relative Name and Phone Number:       Current Level of Care: Hospital Recommended Level of Care: [place] Prior Approval Number:    Date Approved/Denied:   PASRR Number: [id number]  Discharge Plan: SNF    Current Diagnoses: Patient Active Problem List   Diagnosis Date Noted  . Acute delirium 05/17/2015  . HCAP (healthcare-associated pneumonia)   . Macrocytosis   . 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 Altered mental status   . Sepsis ([place]) 05/15/2015  . Lumbar spondylosis 03/20/2015  . Paresthesias 04/01/2013  . Tremor 03/19/2013    Orientation RESPIRATION BLADDER Height & Weight     Self    Continent Weight: 245 lb 2.4 oz (111.2 kg) Height:  5\' 9"  (175.3 cm)  BEHAVIORAL SYMPTOMS/MOOD NEUROLOGICAL BOWEL NUTRITION STATUS      Continent Diet (regular)  AMBULATORY STATUS COMMUNICATION OF NEEDS Skin   Limited Assist Verbally Normal                       Personal Care Assistance Level of Assistance  Bathing, Feeding, Dressing Bathing Assistance: Limited assistance Feeding assistance: Limited assistance Dressing Assistance: Limited assistance     Functional Limitations Info  Sight, Hearing, Speech Sight Info: Adequate Hearing Info: Adequate Speech Info: Adequate    SPECIAL CARE FACTORS FREQUENCY  PT (By licensed PT)     PT Frequency: 5x              Contractures      Additional Factors Info  Code Status, Allergies Code Status Info: full Allergies Info: codeine,hydromorphone,morphine,penicillin            Current Medications (05/19/2015):  This is the current hospital active medication list Current Facility-Administered Medications  Medication Dose Route Frequency Provider Last Rate Last Dose  . acetaminophen (TYLENOL) tablet 650 mg  650 mg Oral Q6H PRN Rosemarie Ax, MD   650 mg at 05/19/15 [id number]   Or  . acetaminophen (TYLENOL) suppository 650 mg  650 mg Rectal Q6H PRN Rosemarie Ax, MD   650 mg at 05/17/15 1337  . albuterol (PROVENTIL) (2.5 MG/3ML) 0.083% nebulizer solution 2.5 mg  2.5 mg Nebulization Q6H PRN Todd D McDiarmid, MD      . budesonide (PULMICORT) nebulizer solution 0.25 mg  0.25 mg Nebulization BID Blane Ohara McDiarmid, MD   0.25 mg at 05/19/15 0742  . dextrose 5 %-0.45 % sodium chloride infusion   Intravenous Continuous Blane Ohara McDiarmid, MD   Stopped at 05/19/15 1200  . folic acid injection 1 mg  1 mg Intravenous Daily Rosemarie Ax, MD   1 mg at 05/19/15 0856  . gabapentin (NEURONTIN) capsule 100 mg  100 mg Oral TID Blane Ohara McDiarmid, MD   100 mg at 05/19/15 0855  . heparin injection 5,000 Units  5,000 Units Subcutaneous 3 times per day Rosemarie Ax, MD   5,000 Units at 05/19/15 1453  . ketorolac (TORADOL) 30 MG/ML injection 30 mg  30 mg Intravenous Daily Sela Hua, MD  30 mg at 05/19/15 0855  . levothyroxine (SYNTHROID, LEVOTHROID) injection 37.5 mcg  37.5 mcg Intravenous Daily Vivi Barrack, MD   37.5 mcg at 05/19/15 0856  . LORazepam (ATIVAN) injection 1 mg  1 mg Intravenous BID Blane Ohara McDiarmid, MD   1 mg at 05/19/15 0800  . LORazepam (ATIVAN) injection 2-3 mg  2-3 mg Intravenous Q1H PRN Rosemarie Ax, MD   2 mg at 05/18/15 2228  . ondansetron (ZOFRAN) injection 4 mg  4 mg Intravenous Q6H PRN Rosemarie Ax, MD   4 mg at 05/17/15 0241  . phenol (CHLORASEPTIC) mouth spray 1 spray  1 spray Mouth/Throat PRN Rosemarie Ax, MD   1 spray at 05/18/15 2226  . piperacillin-tazobactam (ZOSYN) IVPB 3.375 g  3.375 g Intravenous Q8H Vivi Barrack, MD   3.375 g  at 05/19/15 1327  . [START ON 05/22/2015] thiamine (B-1) injection 100 mg  100 mg Intravenous Daily Mercy Riding, MD      . thiamine 500mg  in normal saline (70ml) IVPB  500 mg Intravenous TID Mercy Riding, MD   500 mg at 05/19/15 1030  . vancomycin (VANCOCIN) IVPB 750 mg/150 ml premix  750 mg Intravenous Q12H Darnell Level Mancheril, RPH   750 mg at 05/19/15 1327     Discharge Medications: Please see discharge summary for a list of discharge medications.  Relevant Imaging Results:  Relevant Lab Results:   Additional Information ss# [id number]  Cranford Mon, [place]

## 2015-05-20 ENCOUNTER — Ambulatory Visit: Payer: Self-pay | Admitting: Family Medicine

## 2015-05-20 ENCOUNTER — Inpatient Hospital Stay (HOSPITAL_COMMUNITY): Payer: Medicare HMO

## 2015-05-20 DIAGNOSIS — R569 Unspecified convulsions: Secondary | ICD-10-CM

## 2015-05-20 HISTORY — DX: Unspecified convulsions: R56.9

## 2015-05-20 LAB — BASIC METABOLIC PANEL
ANION GAP: 7 (ref 5–15)
BUN: 5 mg/dL — AB (ref 6–20)
CALCIUM: 8.6 mg/dL — AB (ref 8.9–10.3)
CO2: 29 mmol/L (ref 22–32)
Chloride: 107 mmol/L (ref 101–111)
Creatinine, Ser: 0.95 mg/dL (ref 0.44–1.00)
GFR calc Af Amer: >60 mL/min (ref >60)
GFR calc non Af Amer: >60 mL/min (ref >60)
GLUCOSE: 130 mg/dL — AB (ref 65–99)
Potassium: 3.9 mmol/L (ref 3.5–5.1)
Sodium: 143 mmol/L (ref 135–145)

## 2015-05-20 LAB — CBC
HEMATOCRIT: 34.5 % — AB (ref 36.0–46.0)
Hemoglobin: 11.1 g/dL — ABNORMAL LOW (ref 12.0–15.0)
MCH: 32.4 pg (ref 26.0–34.0)
MCHC: 32.2 g/dL (ref 30.0–36.0)
MCV: 100.6 fL — AB (ref 78.0–100.0)
Platelets: 220 10*3/uL (ref 150–400)
RBC: 3.43 MIL/uL — ABNORMAL LOW (ref 3.87–5.11)
RDW: 14.3 % (ref 11.5–15.5)
WBC: 7.2 10*3/uL (ref 4.0–10.5)

## 2015-05-20 LAB — CULTURE, BLOOD (ROUTINE X 2)
CULTURE: NO GROWTH
CULTURE: NO GROWTH

## 2015-05-20 MED ORDER — PREDNISONE 50 MG PO TABS
50.0000 mg | ORAL_TABLET | Freq: Every day | ORAL | Status: DC
  Administered 2015-05-21: 50 mg via ORAL
  Filled 2015-05-20: qty 1

## 2015-05-20 MED ORDER — POLYETHYLENE GLYCOL 3350 17 G PO PACK
17.0000 g | PACK | Freq: Every day | ORAL | Status: DC
  Administered 2015-05-20: 17 g via ORAL
  Filled 2015-05-20: qty 1

## 2015-05-20 MED ORDER — LORAZEPAM 1 MG PO TABS
1.0000 mg | ORAL_TABLET | Freq: Two times a day (BID) | ORAL | Status: DC
  Administered 2015-05-20 – 2015-05-21 (×2): 1 mg via ORAL
  Filled 2015-05-20 (×2): qty 1

## 2015-05-20 MED ORDER — LEVOTHYROXINE SODIUM 75 MCG PO TABS
75.0000 ug | ORAL_TABLET | Freq: Every day | ORAL | Status: DC
  Administered 2015-05-21: 75 ug via ORAL
  Filled 2015-05-20: qty 1

## 2015-05-20 MED ORDER — NAPROXEN 250 MG PO TABS
500.0000 mg | ORAL_TABLET | Freq: Two times a day (BID) | ORAL | Status: DC
  Administered 2015-05-20 – 2015-05-21 (×2): 500 mg via ORAL
  Filled 2015-05-20 (×2): qty 2

## 2015-05-20 MED ORDER — VITAMIN B-1 100 MG PO TABS
100.0000 mg | ORAL_TABLET | Freq: Every day | ORAL | Status: DC
Start: 2015-05-22 — End: 2015-05-21

## 2015-05-20 MED ORDER — FOLIC ACID 1 MG PO TABS
1.0000 mg | ORAL_TABLET | Freq: Every day | ORAL | Status: DC
  Administered 2015-05-21: 1 mg via ORAL
  Filled 2015-05-20: qty 1

## 2015-05-20 MED ORDER — ATORVASTATIN CALCIUM 40 MG PO TABS
40.0000 mg | ORAL_TABLET | Freq: Every day | ORAL | Status: DC
  Administered 2015-05-20: 40 mg via ORAL
  Filled 2015-05-20: qty 1

## 2015-05-20 MED ORDER — TIOTROPIUM BROMIDE MONOHYDRATE 18 MCG IN CAPS
18.0000 ug | ORAL_CAPSULE | Freq: Every day | RESPIRATORY_TRACT | Status: DC
  Administered 2015-05-20 – 2015-05-21 (×2): 18 ug via RESPIRATORY_TRACT
  Filled 2015-05-20: qty 5

## 2015-05-20 NOTE — Progress Notes (Addendum)
Family Medicine Teaching Service Daily Progress Note Intern Pager: [phone]  Patient name: Brandi Stephens Medical record number: [id number] Date of birth: 12-29-1945 Age: 70 y.o. Gender: female  Primary Care Provider: Harland Dingwall, NP Consultants: None Code Status:  Full   Pt Overview and Major Events to Date:    Assessment and Plan:  Cylah Friedt Stephens is a 70 y.o. female presenting with sepsis due to unknown source and altered mental status. PMH is significant for HTN, lower back pain s/p surgery in December 2016.   Sepsis secondary to CAP vs COPD exacerbation. Currently on 3 L nasal cannula Antibiotic treatment for possible Complete tomorrow. However patient continues to have increased oxygen requirement, coughing, dyspnea on exertion. 30-pack-year history of smoking, currently uses the "vaps." Meningitis per LP and CSF cultures, obtained after treatment with antibotics. Remains Afebrile and stable vitals. Pulse ox decreased with exertion this AM.  - Continue Levaquin 6/7 days (end date: 2/2).  - Continue Pulmicort, will add Spiriva for COPD - Will start 5 day course of Prednisone 50 mg   - Continue incentive spirometry - Blood cultures: no growth x 3 days - CSF culture: no growth x 3 days  - continuous pulse ox - Vitals per unit routine - Wean nasal cannula as tolerated  - Follow-up CXR  Altered Mental Status. Alert and orientated 3 able to name current president and past president. However per daughter had a hallucination this morning stating that she saw a dog in the room. Altered mental status concerning for acute delirium associated with hospitalization and possible respiratory depression. Differential includes meningitis vs sepsis 2/2 pneumonia vs withdrawal from alcohol and opiates vs recent Prednisone use. No new changes in medications recently. CIWAs 2,6, and 4 overnight.  - Continue high dose thiamine IV  - Scheduled Ativan 1mg  bid + Ativan prn for CIWA - Continue CIWA  protocol - Will monitor closely  Acute gout flare: Improved. Pt complaining of gout-like pain in her right foot. - d/c IV Toradol, start naproxen 500 mg BID   Macrocytic Anemia: Hgb 10.9, MCV 101.2. B12 and folate were both normal, so likely secondary to alcohol use. Iron panel: Iron 18 (L), TIBC 263 (nl), ferritin 89 (nl). Reticulocyte count normal. - Monitor with daily CBCs  Chronic back pain s/p L4-L5-S1 fusion. Surgery performed 03/20/15. Pt has also been having muscle spasms since her surgery.  - Holding home Baclofen 10mg  daily - Continue home Gabapentin 100mg  tid  Hypertension. BPs initially low, but have normalized. - Holding home Valsartan-HCTZ 320-12.5mg    Lower extremity edema. No ECHO in our records - Holding home Lasix 20 mg daily for now   Hypothyroidism. TSH 0.787, T4 0.95. - Start levothyroxine 75 mcg PO   Hyperlipidemia - increase Lipitor to 40 mg from 20 mg   Alcohol use. Pt drinks ~10 drinks per week per family. Was also prescribed valium and ativan as home medications. - CIWA - Scheduled Ativan 1mg  bid  FEN/GI:Prophylaxis: Heparin sq, Regular diet,  Miralax for constipation   Disposition: SNF placement, daughter amenable. Have not discussed with patient yet.  Subjective:  Alert and orientated 3 this AM.. Her daughter noted that patient said there was a dog in the morning and in the room early this morning, concerned about these hallucinations. She also constipated this morning. She denies shortness of breath, chest pain, nausea vomiting.  Objective: Temp:  [97.9 F (36.6 C)-98.6 F (37 C)] 97.9 F (36.6 C) (02/01 0610) Pulse Rate:  [66-88] 66 (02/01 0610)  Resp:  [16-18] 18 (02/01 0610) BP: (122-144)/(65-95) 124/95 mmHg (02/01 0610) SpO2:  [92 %-100 %] 97 % (02/01 0829) Physical Exam: General: Alert and oriented 3, NAD Cardiovascular: Regular rate rhythm, no murmurs rubs gallops Respiratory: slight wheezing initially on exam, otherwise CTAB   Abdomen: BS +, no ttp Extremities: 2+ lower extremity edema bilaterally  Laboratory:  Recent Labs Lab 05/17/15 0324 05/18/15 0410 05/20/15 0820  WBC 10.0 8.9 7.2  HGB 10.9* 10.9* 11.1*  HCT 35.6* 35.0* 34.5*  PLT 198 204 220    Recent Labs Lab 05/15/15 1111  05/17/15 0324 05/18/15 0410 05/20/15 0820  NA 144  < > 140 142 143  K 5.0  < > 4.0 3.9 3.9  CL 106  < > 107 108 107  CO2 24  < > 27 29 29   BUN 34*  < > 8 <5* 5*  CREATININE 1.59*  < > 0.78 0.73 0.95  CALCIUM 9.7  < > 8.9 9.0 8.6*  PROT 6.4*  --   --   --   --   BILITOT 0.8  --   --   --   --   ALKPHOS 78  --   --   --   --   ALT 16  --   --   --   --   AST 34  --   --   --   --   GLUCOSE 131*  < > 131* 127* 130*  < > = values in this interval not displayed.   Imaging/Diagnostic Tests: Dg Fluoro Guide Lumbar Puncture  05/18/2015  CLINICAL DATA:  Altered mental status EXAM: DIAGNOSTIC LUMBAR PUNCTURE UNDER FLUOROSCOPIC GUIDANCE FLUOROSCOPY TIME:  Fluoroscopy Time (in minutes and seconds): 36 seconds Number of Acquired Images:  0 (screen capture only) PROCEDURE: Informed consent was obtained from the patient prior to the procedure, including potential complications of headache, allergy, and pain. With the patient prone, the lower back was prepped with Betadine. 1% Lidocaine was used for local anesthesia. Lumbar puncture was performed at the L2-3 level using a 22 gauge needle with return of initially clear CSF with an opening pressure of 31 cm water. After initial vial of CSF was obtained, the patient adjusted her position, and CSF became blood-tinged. This cleared between vials 2 and 4. 12.5 ml of CSF were obtained for laboratory studies. The patient tolerated the procedure well and there were no apparent complications. IMPRESSION: Successful fluoroscopic guided lumbar puncture at L2-3. Electronically Signed   By: Julian Hy M.D.   On: 05/18/2015 14:22    Tracie Dore Cletis Media, MD 05/20/2015, 9:49 AM PGY-1, [place] Intern pager: [phone], text pages welcome

## 2015-05-20 NOTE — Clinical Social Work Note (Signed)
Brandi Stephens, patient's first preference for SNF has offered the patient a bed.   Liz Beach MSW, [place], [place], [id number]

## 2015-05-20 NOTE — Progress Notes (Signed)
Pt ambulated a few feet outside her room with no oxygen. Oxygen sats were 94% at start of walk and 77% at end of walk. Pt breathless after walking. Saturations back up to low 90s while sitting down. No oxygen in use at this time

## 2015-05-20 NOTE — Progress Notes (Signed)
Pt back on oxygen at 2l/min. Oxygen sats 93%

## 2015-05-21 LAB — CSF CULTURE

## 2015-05-21 LAB — CBC
HCT: 36.8 % (ref 36.0–46.0)
HEMOGLOBIN: 11.7 g/dL — AB (ref 12.0–15.0)
MCH: 32.1 pg (ref 26.0–34.0)
MCHC: 31.8 g/dL (ref 30.0–36.0)
MCV: 101.1 fL — ABNORMAL HIGH (ref 78.0–100.0)
Platelets: 226 10*3/uL (ref 150–400)
RBC: 3.64 MIL/uL — ABNORMAL LOW (ref 3.87–5.11)
RDW: 14.3 % (ref 11.5–15.5)
WBC: 8 10*3/uL (ref 4.0–10.5)

## 2015-05-21 LAB — CSF CULTURE W GRAM STAIN: Culture: NO GROWTH

## 2015-05-21 MED ORDER — LORAZEPAM 0.5 MG PO TABS: ORAL_TABLET | ORAL | Status: DC

## 2015-05-21 MED ORDER — LORAZEPAM 0.5 MG PO TABS: 0.5000 mg | ORAL_TABLET | Freq: Two times a day (BID) | ORAL | Status: DC

## 2015-05-21 MED ORDER — ATORVASTATIN CALCIUM 40 MG PO TABS: 40.0000 mg | ORAL_TABLET | Freq: Every day | ORAL | Status: DC

## 2015-05-21 MED ORDER — TIOTROPIUM BROMIDE MONOHYDRATE 18 MCG IN CAPS: 18.0000 ug | ORAL_CAPSULE | Freq: Every day | RESPIRATORY_TRACT | Status: DC

## 2015-05-21 NOTE — Care Management Important Message (Signed)
Important Message  Patient Details  Name: Brandi Stephens MRN: [id number] Date of Birth: Jun 15, 1945   Medicare Important Message Given:  Yes    [name] [name] 05/21/2015, 1:14 PM

## 2015-05-21 NOTE — Clinical Social Work Note (Signed)
Clinical Social Worker facilitated patient discharge including contacting patient family and facility to confirm patient discharge plans.  Clinical information faxed to facility and family agreeable with plan.  CSW arranged transport with patient daughter to [facility].  RN to call report prior to discharge.  Clinical Social Worker will sign off for now as social work intervention is no longer needed. Please consult [place] again if new need arises.  Barbette Or, [place]

## 2015-05-21 NOTE — Progress Notes (Signed)
PT Cancellation Note  Patient Details Name: Brandi Stephens MRN: [id number] DOB: 10/18/1945   Cancelled Treatment:    Reason Eval/Treat Not Completed: Other (comment) (pt getting in the shower with sister and not able to participate at this time)   Melford Aase 05/21/2015, 9:57 AM Elwyn Reach, [place]

## 2015-05-21 NOTE — Progress Notes (Signed)
PT Cancellation Note  Patient Details Name: Brandi Stephens MRN: [id number] DOB: 05/02/1945   Cancelled Treatment:    Reason Eval/Treat Not Completed: Patient declined, no reason specified (pt initially agreeable and allowed room setup. RN arrived and stated D/C for today which made pt very upset and then denied any attempts at therapy at this time. Will attempt at a later date)   Melford Aase 05/21/2015, 11:33 AM Elwyn Reach, [place]

## 2015-05-21 NOTE — Discharge Instructions (Signed)
You were admitted for altered mental status. This was like due to some of the medications you were taking, or withdrawal from recently stopping some medications. There was concern for sepsis causing your altered mental status, however no source of infection was found. You improved after receiving antibiotics.   We have made changes to your medications as follows: - For COPD, continue to use the QVAR inhaler you were using prior to admission. Begin using the Spiriva inhaler as well.  - For back pain, continue taking gabapentin 100 mg three times a day.  - Your blood pressure has been either low or normal throughout admission, so please STOP taking Valsartan-HCTZ (Di[place]).  - Your leg swelling improved even after we stopped Lasix, so please STOP taking Lasix (furosemide).  - For hypothyroidism, continue Synthroid 75 mcg.  - For high cholesterol, we increased your dose of Lipitor from 20 mg daily to 40 mg daily. Please continue to take 40 mg every day.  - For gout, you can continue taking allopurinol daily.   Many of the medications you were on are not safe in patients your age, and likely contributed to your altered mental status.  - Please STOP taking baclofen for back pain. - Please STOP taking Valium.  - It is unsafe to stop Ativan abruptly, so we will wean your dose. For the next week, take 0.5 mg Ativan twice a day. After that, take 0.25 mg Ativan twice a day for an additional week. After that, please stop taking Ativan all together.

## 2015-05-21 NOTE — Progress Notes (Signed)
Family Medicine Teaching Service Daily Progress Note Intern Pager: [phone]  Patient name: Brandi Stephens Medical record number: [id number] Date of birth: 26-Sep-1945 Age: 70 y.o. Gender: female  Primary Care Provider: Harland Dingwall, NP Consultants: None Code Status:  Full   Assessment and Plan:  Brandi Stephens is a 70 y.o. female presenting with vital sign instability concerning for sepsis and altered mental status. PMH is significant for HTN, lower back pain s/p surgery in December 2016.   Vital sign instability:  likely 2/2 to withdrawal from medications and alcohol. CAP and/or COPD exacerbation may also have been contributing factors. Final blood and CSF cultures with no growth. CXR yesterday showed small stable bilateral pleural effusions with patchy bilateral LL opacities, most consistent with atelectasis. Afebrile with VSS overnight. Desat with exertion yesterday, reportedly decreasing as low as high 70s. Currently on RA. - Complete 7 day course Levaquin today - Continue Pulmicort, Spiriva for COPD - Continue incentive spirometry - Vitals per unit routine  Altered Mental Status. Resolved. Altered mental status concerning for acute delirium associated with hospitalization and possible respiratory depression. Differential also includes meningitis vs sepsis 2/2 pneumonia vs withdrawal from alcohol and opiates vs recent prednisone use. No new changes in medications recently. CIWAs 2,0,0 overnight. Remains A&Ox3.  - Disontinue high dose thiamine IV  - Scheduled Ativan 1mg  bid + Ativan prn for CIWA. Will decrease Ativan to 0.5 mg BID at discharge.  - Continue CIWA protocol  Acute gout flare: Initially complaining of gout-like pain in her R foot, though now improved.  - Continue naproxen 500 mg BID   Macrocytic Anemia: B12 and folate were both normal, so likely secondary to alcohol use. Iron panel: Iron 18 (L), TIBC 263 (nl), ferritin 89 (nl). Reticulocyte count normal. Hgb increased to  11.7 today with MCV 101.1 (2/2).  - Monitor with daily CBCs  Chronic back pain s/p L4-L5-S1 fusion. Surgery performed 03/20/15. Pt has also been having muscle spasms since her surgery.  - Holding home Baclofen 10mg  daily - Continue home Gabapentin 100mg  tid  Hypertension. BPs initially low, but have normalized. - Holding home Valsartan-HCTZ 320-12.5mg    Lower extremity edema. No ECHO in our records - Holding home Lasix 20 mg daily for now   Hypothyroidism. TSH 0.787, T4 0.95. - Cont home levothyroxine 75 mcg PO   Hyperlipidemia - Increase Lipitor to 40 mg from 20 mg   Alcohol use. Pt drinks ~10 drinks per week per family. Was also prescribed Valium and Ativan as home medications. - CIWA - Scheduled Ativan 1mg  BID  FEN/GI: Regular diet Prophylaxis: Heparin sq, Miralax  Disposition: SNF placement pending bed availability   Subjective:  Patient was transitioned to room air briefly yesterday. When walking without oxygen, patient's O2 sats decreased from 94% at beginning of walk to 77% at end. Sats returned to low 90s after sitting down. She was subsequently restarted on 2L O2, and was able to wean to 1L overnight, then to RA this AM.  Patient is now amenable to short-term SNF stay after discharge. She would like to regain strength before returning home.   Objective: Temp:  [97.8 F (36.6 C)-98.5 F (36.9 C)] 98.4 F (36.9 C) (02/02 0938) Pulse Rate:  [60-78] 72 (02/02 0938) Resp:  [16-18] 18 (02/02 0938) BP: (133-147)/(66-76) 145/66 mmHg (02/02 0938) SpO2:  [91 %-100 %] 92 % (02/02 [id number]) Physical Exam: General: Sitting up in bed in NAD; daughter at bedside Cardiovascular: RRR, no murmurs appreciated Respiratory: Normal work of breathing on  RA, faint wheezes Abdomen: soft, non-tender, non-distended, +BS Extremities: 2+ lower extremity edema bilaterally, no TTP of toe Neuro: A&Ox4, no focal deficits  Laboratory:  Recent Labs Lab 05/18/15 0410 05/20/15 0820  05/21/15 0707  WBC 8.9 7.2 8.0  HGB 10.9* 11.1* 11.7*  HCT 35.0* 34.5* 36.8  PLT 204 220 226    Recent Labs Lab 05/15/15 1111  05/17/15 0324 05/18/15 0410 05/20/15 0820  NA 144  < > 140 142 143  K 5.0  < > 4.0 3.9 3.9  CL 106  < > 107 108 107  CO2 24  < > 27 29 29   BUN 34*  < > 8 <5* 5*  CREATININE 1.59*  < > 0.78 0.73 0.95  CALCIUM 9.7  < > 8.9 9.0 8.6*  PROT 6.4*  --   --   --   --   BILITOT 0.8  --   --   --   --   ALKPHOS 78  --   --   --   --   ALT 16  --   --   --   --   AST 34  --   --   --   --   GLUCOSE 131*  < > 131* 127* 130*  < > = values in this interval not displayed.   Imaging/Diagnostic Tests: Dg Chest 2 View  05/20/2015  CLINICAL DATA:  Shortness of breath.  Postop. EXAM: CHEST  2 VIEW COMPARISON:  05/15/2015 chest radiograph FINDINGS: Stable cardiomediastinal silhouette with mild cardiomegaly. No pneumothorax. Small bilateral pleural effusions, stable since 05/15/2015. No pulmonary edema. Patchy opacity in the basilar lower lobes bilaterally, slightly increased on the right. IMPRESSION: 1. Stable mild cardiomegaly without pulmonary edema. 2. Stable small bilateral pleural effusions. 3. Patchy bilateral lower lobe opacities, slightly increased on the right, probably atelectasis, cannot exclude a component of aspiration or pneumonia. Electronically Signed   By: Ilona Sorrel M.D.   On: 05/20/2015 13:31    Catharine, MD 05/21/2015, 11:35 AM PGY-1, [place] Intern pager: [phone], text pages welcome

## 2015-05-21 NOTE — Clinical Social Work Placement (Signed)
   CLINICAL SOCIAL WORK PLACEMENT  NOTE  Date:  05/21/2015  Patient Details  Name: Brandi Stephens MRN: [id number] Date of Birth: 10/25/1945  Clinical Social Work is seeking post-discharge placement for this patient at the [place] level of care (*CSW will initial, date and re-position this form in  chart as items are completed):  Yes   Patient/family provided with [facility] Work Department's list of facilities offering this level of care within the geographic area requested by the patient (or if unable, by the patient's family).  Yes   Patient/family informed of their freedom to choose among providers that offer the needed level of care, that participate in Medicare, Medicaid or managed care program needed by the patient, have an available bed and are willing to accept the patient.  Yes   Patient/family informed of [facility]'s ownership interest in [facility] and [facility], as well as of the fact that they are under no obligation to receive care at these facilities.  PASRR submitted to EDS on       PASRR number received on       Existing PASRR number confirmed on 05/19/15     FL2 transmitted to all facilities in geographic area requested by pt/family on 05/19/15     FL2 transmitted to all facilities within larger geographic area on       Patient informed that his/her managed care company has contracts with or will negotiate with certain facilities, including the following:        Yes   Patient/family informed of bed offers received.  Patient chooses bed at [facility]     Physician recommends and patient chooses bed at [facility]    Patient to be transferred to [facility] on 05/21/15.  Patient to be transferred to facility by [facility]     Patient family notified on 05/21/15 of transfer.  Name of family member notified:  Binti Mollet (daughter)     PHYSICIAN       Additional Comment:    Barbette Or,  [place]

## 2015-05-21 NOTE — Progress Notes (Signed)
Brandi Stephens to be D/C'd to [facility] per MD order. Discussed with the patient and all questions fully answered.  VSS, Skin clean, dry and intact without evidence of skin break down, no evidence of skin tears noted.  IV catheter discontinued intact. Site without signs and symptoms of complications. Dressing and pressure applied.  SNF placement arranged by Education officer, [facility]. Sent prescription with patient's daughter to facility.  Report called to Janett Billow, RN at [facility].  Patient instructed to return to ED, call 911, or call MD for any changes in condition.   Patient to be escorted via [place], and D/C to SNF via private auto.

## 2015-05-22 ENCOUNTER — Telehealth: Payer: Self-pay | Admitting: Internal Medicine

## 2015-05-22 ENCOUNTER — Telehealth: Payer: Self-pay | Admitting: Family Medicine

## 2015-05-22 NOTE — Telephone Encounter (Signed)
Spoke with Dr. Redmond School and [name] about this and Dr. Redmond School said that she needed to talk to the inpatient place and take that up with them as we can not handle that. Pt daughter [name] was advised that she could talk to the director and find out if another nurse can take over seeing her or can call the hospital and speak with the social worker (looks like Control and instrumentation engineer did the placement note) and give her the complaints she has and see if they can send her somewhere else as there is nothing here that we can do since the hospital did the initial transition to the [facility].

## 2015-05-22 NOTE — Telephone Encounter (Signed)
Pt's daughter [name] called today states that pt is at [facility] but had some issues happen last night and today and pt is not wanting to stay there anymore to be care for. Last night Pt was having low oxygen problem, bp was high 191-76 at 7:30pm and they took it again at 9:30 and it was 176-71. And they did not do anything to help her bp or oxygen and have not checked on today at all to get bp or pulse ox. They met with the director of the place and they did not give advise on what to do about care except to please stay. Pt is not wanting to stay there (inpatient) as she does not feel safe and is wanting to go home but daughter is wanting to know if we can get authorization to send her somewhere else. [name] (daughter) can be reached at [phone]

## 2015-05-22 NOTE — Telephone Encounter (Signed)
Daughter has questions about medications on mothers discharge from hospital.  She states dr Wendy Poet was her dr at the hospital

## 2015-05-23 LAB — ANAEROBIC CULTURE

## 2015-05-25 NOTE — Telephone Encounter (Signed)
Will forward to Dr. McDiarmid. Jazmin Hartsell,CMA  

## 2015-05-26 NOTE — Telephone Encounter (Signed)
Pt is now having a gout flareup.  She was given a one day drug, something that started with a T, intravenously while in the hospital. Daughter would like to know what that is. Daughter would like an immediate response because it is time sensitive.  Pt is in rehab for only a specfic amt of time but cannot do rehab because she cannot walk due to the gout

## 2015-05-26 NOTE — Telephone Encounter (Signed)
Unfortunately, I was not attending for later half of patient's admission.  Please direct questioning to Dr Emmaline Life who discharged patient.

## 2015-05-26 NOTE — Telephone Encounter (Signed)
Will forward to Dr. Emmaline Life. Brandi Stephens,CMA

## 2015-05-27 NOTE — Telephone Encounter (Signed)
Patient received Toradol in the hospital, however explained that because this is IV patient will not be able to receive outpatient. However they could consider Naproxen BID as an alternative.

## 2015-06-01 ENCOUNTER — Telehealth: Payer: Self-pay | Admitting: Family Medicine

## 2015-06-01 ENCOUNTER — Ambulatory Visit (INDEPENDENT_AMBULATORY_CARE_PROVIDER_SITE_OTHER): Payer: Commercial Managed Care - HMO | Admitting: Family Medicine

## 2015-06-01 ENCOUNTER — Encounter: Payer: Self-pay | Admitting: Family Medicine

## 2015-06-01 VITALS — BP 124/68 | HR 80 | Wt 252.6 lb

## 2015-06-01 DIAGNOSIS — J449 Chronic obstructive pulmonary disease, unspecified: Secondary | ICD-10-CM | POA: Diagnosis not present

## 2015-06-01 DIAGNOSIS — M549 Dorsalgia, unspecified: Secondary | ICD-10-CM | POA: Diagnosis not present

## 2015-06-01 DIAGNOSIS — M1000 Idiopathic gout, unspecified site: Secondary | ICD-10-CM

## 2015-06-01 DIAGNOSIS — E038 Other specified hypothyroidism: Secondary | ICD-10-CM

## 2015-06-01 DIAGNOSIS — R609 Edema, unspecified: Secondary | ICD-10-CM | POA: Diagnosis not present

## 2015-06-01 DIAGNOSIS — L299 Pruritus, unspecified: Secondary | ICD-10-CM | POA: Diagnosis not present

## 2015-06-01 DIAGNOSIS — G8929 Other chronic pain: Secondary | ICD-10-CM | POA: Diagnosis not present

## 2015-06-01 DIAGNOSIS — I10 Essential (primary) hypertension: Secondary | ICD-10-CM

## 2015-06-01 DIAGNOSIS — M791 Myalgia, unspecified site: Secondary | ICD-10-CM

## 2015-06-01 DIAGNOSIS — Z09 Encounter for follow-up examination after completed treatment for conditions other than malignant neoplasm: Secondary | ICD-10-CM

## 2015-06-01 DIAGNOSIS — R251 Tremor, unspecified: Secondary | ICD-10-CM | POA: Diagnosis not present

## 2015-06-01 DIAGNOSIS — E785 Hyperlipidemia, unspecified: Secondary | ICD-10-CM | POA: Diagnosis not present

## 2015-06-01 NOTE — Progress Notes (Signed)
Subjective:    Patient ID: Brandi Stephens, female    DOB: 01-Jul-1945, 70 y.o.   MRN: [id number]  HPI Chief Complaint  Patient presents with  . hospital follow-up    hospital follow-up and rehab.    She is here for follow-up after discharge from [facility] nursing [facility] yesterday. Daughter is with patient and helping her with medications.  She was hospitalized on 05/15/2015 for altered mental status and sepsis and discharged from the hospital 05/21/2015. Alvis Lemmings will be coming to the home starting tomorrow. She reports having labs done at the SNF 2 days ago, signing release for [place] to get records.  She was treated for pneumonia. Ruled out meningitis. No known cause for sepsis found.  Daughter stated she thinks her altered mental status may have been related to missing doses of her pain and anxiety medications. States this put her into DTs in the hospital.   Complains of muscle weakness and aches thinks this is related to increased atorvastatin dose and would like to decrease back to 20 mg.  While she was in the hospital, a chest CT revealed COPD, emphysema and she was started on Spiriva and Qvar.   She is taking gabapentin 100 mg twice daily, and 300 mg at bedtime.  Itching- thinks generalized itching is due to an antibiotic she was given in the hospital. States it seems to be improving and taking Hydroxyzine and benadryl.  Was seeing neurologist for tremors. Taking Primidone 25 mg for tremors and to prevent seizures.  Complains of severe anxiety. lorazapam for panic attacks 1 mg at bedtime.  Taking Tramadol- 50 mg every 6 hours as needed for back and leg pain.  States she continues to have lower extremity edema but this has improved since being in hospital. She plans to get compression stockings.   She states she has ability to check her blood pressures at home.  Has been taking colchicine for gout flare, states she has 2 days left and plans to finish it to prevent gout from  flaring up again.   Reviewed allergies, mediations, past medical, and social history.  She lives with her husband.  Reports she is wanting to stop smoking and wonders if e- cigs are ok to smoke.  She is drinking about a shot or less of vodka or other alcohol nightly and has been doing this for years.    Review of Systems Pertinent positives and negatives in the history of present illness.     Objective:   Physical Exam  Constitutional: She is oriented to person, place, and time. She appears well-developed and well-nourished. No distress.  Cardiovascular: Normal rate, regular rhythm, normal heart sounds and intact distal pulses.   LE edema bilaterally, 1 +  Pulmonary/Chest: Effort normal and breath sounds normal.  Neurological: She is alert and oriented to person, place, and time. She has normal strength. She displays no tremor. No cranial nerve deficit. Gait normal.  Ambulating with a walker  Skin: Skin is warm and dry. No cyanosis. No pallor. Nails show no clubbing.  Psychiatric: She has a normal mood and affect. Her speech is normal and behavior is normal. Judgment and thought content normal. Cognition and memory are normal.  Displays some difficulty with recall of events that led up to hospital admission and events during her hospital and SNF stay.    BP 124/68 mmHg  Pulse 80  Wt 252 lb 9.6 oz (114.579 kg)      Assessment & Plan:  Hospital discharge follow-up  Chronic obstructive pulmonary disease, unspecified COPD type ([place])  Idiopathic gout, unspecified chronicity, unspecified site  Back pain, chronic  Other specified hypothyroidism  Tremor  Dependent edema  Essential hypertension  Hyperlipidemia  Muscle ache  Itching  Reviewed hospital discharge summary and compared with discharge medication list from [facility] SNF. Reviewed labs, XR and CT chest result with patient and daughter for further clarification per their request.  Discussed patient with Dr  Redmond School.  Muscle ache and Hyperlipidemia - Will reduce her atorvastatin from 40 mg to 20 mg since she reports muscle aches and generalized muscle weakness. She states she was able to tolerate 20 mg dose. It is unclear as to why her dose was increased at hospital discharge. No recent lipid profile visible in chart.  Dependent edema- continue lasix. Ambulate as tolerated and elevate legs when sitting. Discussed compression stockings. She will try to get these from a medical supply store. Continue on K-Dur for potassium replacement due to Lasix usage.  HTN- blood pressure is within goal today. No changes made. Check blood pressures at home daily. Have [facility] nurse do this at their visits as well. Report back readings >130/90 or low readings.  Hypothyroidism- continue current dose of Synthroid  Gout- complete current colchicine dose in next 2 days and then stop colchicine and continue on daily Allopurinol  Itching- this problem appears to be resolving, I recommend weaning off Hydroxyzine and Benadryl as itching improves.  COPD- continue on QVAR adn Spiriva inhalers - stop smoking, even e- cigs.  Back pain- continue current Gabapentin dose, may consider increasing this if needed for pain control. She is also taking Tramadol as needed.  Tremors- she was put on Primidone 25 mg at bedtime for tremors and seizure prevention by Neurologist. She does not have tremor today. She does not have a follow up appointment with neuro. Recommend daughter check on this and if they want her to follow up.  Lorazepam- there is a discrepancy between hospital discharge summary which recommended that she wean off this over 2 weeks and stop. Karenann Cai had patient taking 1 mg nightly. Patient and daughter do not want her to stop this medication. Will need to discuss eventually weaning her off this. She states she is taking it for panic attacks and anxiety. Has been taking it since son died 5 years ago.  Alvis Lemmings will start going to  her home tomorrow.  Attempting to obtain patient lab results from SNF 2 days ago. Will need to check blood work based on these results. Will have patient follow up with me in 1 week.  Recommend patient cut back on nightly alcohol use to half alcoholic beverage and then 1/4 of beverage. Discussed that alcohol and Benzodiazapines are not a good mix and can have potential side effects.

## 2015-06-01 NOTE — Patient Instructions (Signed)
Take Atorvastatin (Lipitor) 20 mg daily instead of 40 mg and let me know if this is not helping with muscle aches and weakness.  Use heat 20 minutes at a time to your right shoulder and try moving it as tolerated.  Use cool compresses to areas that are itching.

## 2015-06-03 ENCOUNTER — Encounter: Payer: Self-pay | Admitting: Family Medicine

## 2015-06-08 ENCOUNTER — Ambulatory Visit (INDEPENDENT_AMBULATORY_CARE_PROVIDER_SITE_OTHER): Payer: Commercial Managed Care - HMO | Admitting: Family Medicine

## 2015-06-08 ENCOUNTER — Encounter: Payer: Self-pay | Admitting: Family Medicine

## 2015-06-08 VITALS — BP 108/62 | HR 64 | Temp 97.4°F

## 2015-06-08 DIAGNOSIS — E785 Hyperlipidemia, unspecified: Secondary | ICD-10-CM | POA: Diagnosis not present

## 2015-06-08 DIAGNOSIS — F329 Major depressive disorder, single episode, unspecified: Secondary | ICD-10-CM

## 2015-06-08 DIAGNOSIS — G479 Sleep disorder, unspecified: Secondary | ICD-10-CM | POA: Diagnosis not present

## 2015-06-08 DIAGNOSIS — M791 Myalgia, unspecified site: Secondary | ICD-10-CM

## 2015-06-08 DIAGNOSIS — J439 Emphysema, unspecified: Secondary | ICD-10-CM | POA: Insufficient documentation

## 2015-06-08 DIAGNOSIS — R609 Edema, unspecified: Secondary | ICD-10-CM | POA: Diagnosis not present

## 2015-06-08 DIAGNOSIS — J449 Chronic obstructive pulmonary disease, unspecified: Secondary | ICD-10-CM | POA: Diagnosis not present

## 2015-06-08 DIAGNOSIS — F32A Depression, unspecified: Secondary | ICD-10-CM

## 2015-06-08 DIAGNOSIS — I10 Essential (primary) hypertension: Secondary | ICD-10-CM | POA: Diagnosis not present

## 2015-06-08 DIAGNOSIS — F411 Generalized anxiety disorder: Secondary | ICD-10-CM | POA: Diagnosis not present

## 2015-06-08 DIAGNOSIS — E782 Mixed hyperlipidemia: Secondary | ICD-10-CM | POA: Insufficient documentation

## 2015-06-08 DIAGNOSIS — M109 Gout, unspecified: Secondary | ICD-10-CM | POA: Insufficient documentation

## 2015-06-08 LAB — CBC WITH DIFFERENTIAL/PLATELET
Basophils Absolute: 0.1 10*3/uL (ref 0.0–0.1)
Basophils Relative: 1 % (ref 0–1)
EOS PCT: 7 % — AB (ref 0–5)
Eosinophils Absolute: 0.7 10*3/uL (ref 0.0–0.7)
HEMATOCRIT: 41.5 % (ref 36.0–46.0)
Hemoglobin: 14 g/dL (ref 12.0–15.0)
LYMPHS PCT: 24 % (ref 12–46)
Lymphs Abs: 2.4 10*3/uL (ref 0.7–4.0)
MCH: 32 pg (ref 26.0–34.0)
MCHC: 33.7 g/dL (ref 30.0–36.0)
MCV: 94.7 fL (ref 78.0–100.0)
MONO ABS: 0.9 10*3/uL (ref 0.1–1.0)
MONOS PCT: 9 % (ref 3–12)
MPV: 10.6 fL (ref 8.6–12.4)
Neutro Abs: 6 10*3/uL (ref 1.7–7.7)
Neutrophils Relative %: 59 % (ref 43–77)
Platelets: 317 10*3/uL (ref 150–400)
RBC: 4.38 MIL/uL (ref 3.87–5.11)
RDW: 14.5 % (ref 11.5–15.5)
WBC: 10.1 10*3/uL (ref 4.0–10.5)

## 2015-06-08 MED ORDER — TIOTROPIUM BROMIDE MONOHYDRATE 18 MCG IN CAPS: 18.0000 ug | ORAL_CAPSULE | Freq: Every day | RESPIRATORY_TRACT | Status: DC

## 2015-06-08 NOTE — Progress Notes (Signed)
Subjective:    Patient ID: Brandi Stephens, female    DOB: Jul 27, 1945, 70 y.o.   MRN: [id number]  HPI Chief Complaint  Patient presents with  . 1 week follow-up    1 week follow-up.    She is here with her daughter for a one-week follow-up on generalized muscle aches that were suspected to be most likely related to increased statin dose and to discuss weaning off lorazepam. She was discharged from skilled nursing facility[facility] on 05/31/15.  [place] is coming to her home. States Occupational therapy came and released her. PT is coming 2 times per week. Getting an aid to help with ADLs. Nurse is coming once a week and daughter states blood pressure and pulse oximetry readings have been ok.   Daughter states patient does not have f/u appointment with neuro surgeon, she will check and see if he wants to see her again.    At our last appointment she was taking Benadryl and hydroxyzine for itching. The plan was to eventually wean her off of these medications due to sedation and anticholinergic effects. States she stopped taking Hydroxyzine because she didn't think it was working. She is still taking benadryl daily for itching.  She was also complaining of muscle aches and generalized muscle weakness and thought this was related to an increased statin dose which happened while she was in the hosptial. She reports that she tolerated 20 mg dose of atorvastatin but experienced muscle aches or weakness after the dose was increased to 40 mg. Today she reports she is doing fine with 20 mg dose, no muscle aches.   There was also a discrepancy that we discussed at her last appointment with lorazepam dose. Hospital discharge summary stated that she was to wean off of this medication over a two-week period however, the skilled nursing facility continued her on this medication 1 mg at bedtime daily. She has continue with daily 1 mg dose at bedtime. She reports still drinking a glass of  alcohol in the evenings.  She is receptive to weaning off Lorazepam but worried that she will have panic attacks and worsening anxiety.  She is concerned about sleep issues. States she goes to sleep fine but then wakes up 2-3 hours later and has difficulty falling back asleep. States she has a TV in her room.   While she was in the hospital, a chest CT revealed COPD, emphysema and she was started on Spiriva and Qvar. She is requesting refill on Spiriva today.   For her chronic back pain, she continues on gabapentin 100 mg twice daily, and 300 mg at bedtime.  Taking Tramadol- 50 mg every 6 hours as needed for back and leg pain- states she does not think it is helping. Questions whether she can take something different for pain.  States she continues to have lower extremity edema, questions increasing her dose of Lasix to help with this. She has not yet started wearing compression stockings.     Review of Systems Pertinent positives and negatives in the history of present illness.     Objective:   Physical Exam  Constitutional: She is oriented to person, place, and time. She appears well-developed and well-nourished. No distress.  Cardiovascular: Normal rate, regular rhythm and intact distal pulses.  Exam reveals no gallop and no friction rub.   No murmur heard. 1+ pitting edema bilateral feet  Pulmonary/Chest: Effort normal and breath sounds normal.  Neurological: She is alert and oriented to  person, place, and time.  Skin: Skin is warm and dry. No rash noted. No pallor.  Psychiatric: She has a normal mood and affect. Her behavior is normal. Judgment and thought content normal.   BP 108/62 mmHg  Pulse 64  Temp(Src) 97.4 F (36.3 C) (Oral)  SpO2 93%     Assessment & Plan:  Generalized muscle ache  Chronic obstructive pulmonary disease, unspecified COPD type (HCC) - Plan: tiotropium (SPIRIVA) 18 MCG inhalation capsule  Essential hypertension - Plan: CBC with  Differential/Platelet, Comprehensive metabolic panel, TSH  Generalized anxiety disorder - Plan: TSH  Depression - Plan: TSH  Sleep disturbance - Plan: TSH  Hyperlipidemia - Plan: Lipid panel  Dependent edema  Discussed that she appears to be doing better with muscle aches on 20 mg Atorvastatin. Will continue at this dose and she will return for fasting lipid panel. Order in computer.  Recommend that she wean off Lorazepam over next 2 weeks. Daughter and patient aware of plan and to not stop medication abruptly. She will also stop Benadryl and may try Claritin or Allegra for itching, no rash to explain this. Will check labs to check for underlying etiology.  Recommend psychiatrist for anxiety, depression and sleep issues. Discussed that I do not feel comfortable starting her on a sleep medication today due to history of possible overdose leading to sepsis approximately 1 month ago. Discussed avoiding sedating medications and alcohol.  Suspect that alcohol and bedtime benzodiazapine use prior to bedtime is causing her to awaken when these are wearing off. Discussed good sleep hygiene.  COPD- continue on inhalers, spiriva refilled.  Edema- recommend using compression stockings and elevating LE when resting. Do not feel comfortable increasing lasix dose since her blood pressure is 108/62. She is aware of blood pressure. Continues on current medication but discussed that we need to keep an eye on her blood pressures and may need to reduce medication if it drops lower.  She can try Tylenol or Ibuprofen for pain and stop Tramadol if she thinks it is not working. May need to increase Gabapentin dose in future.  Will follow up in 2 weeks or pending labs.  Spent a minimum of 40 minutes with patient face to face and at least 50% of that was in counseling and coordination of care.

## 2015-06-08 NOTE — Patient Instructions (Addendum)
Stop taking Benadryl and switch to non-sedating Claritin or Allegra for itching or hives. Stop Hydroxyzine since you have not been taking this anyway. Wean off the Lorazepam as discussed over the next 2 weeks.  Use good sleep hygiene as discussed- cool room, no lights or TV, no caffeine or alcohol or Lorazepam right before you go to sleep.   For the LE edema, elevate your legs when resting and wear compression stockings as tolerated. I do not want to increase your Lasix, your blood pressure may not tolerate this.   You can call to schedule your appointment with the psychiatrist. A few offices are listed below for you to call.   [facility]  [place], [place], [place] [address]  Phone: [phone]  [facility] [address] [place], [place] [address]  Phone: [phone]  2) Talmage Coin, MD, [place], [place], [place] [address] Phone: [phone]  1) [facility]  [place], [place]

## 2015-06-09 LAB — COMPREHENSIVE METABOLIC PANEL
ALBUMIN: 3.8 g/dL (ref 3.6–5.1)
ALT: 13 U/L (ref 6–29)
AST: 14 U/L (ref 10–35)
Alkaline Phosphatase: 67 U/L (ref 33–130)
BUN: 31 mg/dL — ABNORMAL HIGH (ref 7–25)
CALCIUM: 9.7 mg/dL (ref 8.6–10.4)
CHLORIDE: 100 mmol/L (ref 98–110)
CO2: 28 mmol/L (ref 20–31)
Creat: 1.08 mg/dL — ABNORMAL HIGH (ref 0.50–0.99)
GLUCOSE: 111 mg/dL — AB (ref 65–99)
POTASSIUM: 4.7 mmol/L (ref 3.5–5.3)
Sodium: 139 mmol/L (ref 135–146)
Total Bilirubin: 0.5 mg/dL (ref 0.2–1.2)
Total Protein: 6.5 g/dL (ref 6.1–8.1)

## 2015-06-09 LAB — TSH: TSH: 3.05 mIU/L

## 2015-06-11 ENCOUNTER — Encounter: Payer: Self-pay | Admitting: Family Medicine

## 2015-06-15 ENCOUNTER — Other Ambulatory Visit: Payer: Commercial Managed Care - HMO

## 2015-06-15 DIAGNOSIS — E785 Hyperlipidemia, unspecified: Secondary | ICD-10-CM

## 2015-06-15 DIAGNOSIS — R7989 Other specified abnormal findings of blood chemistry: Secondary | ICD-10-CM

## 2015-06-15 LAB — LIPID PANEL
CHOL/HDL RATIO: 4.5 ratio (ref <=5.0)
CHOLESTEROL: 196 mg/dL (ref 125–200)
HDL: 44 mg/dL — AB (ref >=46)
LDL CALC: 87 mg/dL (ref <130)
TRIGLYCERIDES: 325 mg/dL — AB (ref <150)
VLDL: 65 mg/dL — AB (ref <30)

## 2015-06-15 LAB — BASIC METABOLIC PANEL
BUN: 23 mg/dL (ref 7–25)
CALCIUM: 10.3 mg/dL (ref 8.6–10.4)
CO2: 26 mmol/L (ref 20–31)
CREATININE: 1.14 mg/dL — AB (ref 0.50–0.99)
Chloride: 102 mmol/L (ref 98–110)
Glucose, Bld: 119 mg/dL — ABNORMAL HIGH (ref 65–99)
Potassium: 5 mmol/L (ref 3.5–5.3)
Sodium: 139 mmol/L (ref 135–146)

## 2015-06-22 ENCOUNTER — Encounter: Payer: Self-pay | Admitting: Family Medicine

## 2015-06-22 ENCOUNTER — Ambulatory Visit (INDEPENDENT_AMBULATORY_CARE_PROVIDER_SITE_OTHER): Payer: Commercial Managed Care - HMO | Admitting: Family Medicine

## 2015-06-22 VITALS — BP 122/78 | HR 100 | Wt 246.2 lb

## 2015-06-22 DIAGNOSIS — I10 Essential (primary) hypertension: Secondary | ICD-10-CM

## 2015-06-22 DIAGNOSIS — R7309 Other abnormal glucose: Secondary | ICD-10-CM | POA: Diagnosis not present

## 2015-06-22 DIAGNOSIS — M25511 Pain in right shoulder: Secondary | ICD-10-CM | POA: Diagnosis not present

## 2015-06-22 DIAGNOSIS — G8929 Other chronic pain: Secondary | ICD-10-CM | POA: Diagnosis not present

## 2015-06-22 DIAGNOSIS — R609 Edema, unspecified: Secondary | ICD-10-CM | POA: Diagnosis not present

## 2015-06-22 DIAGNOSIS — F411 Generalized anxiety disorder: Secondary | ICD-10-CM

## 2015-06-22 DIAGNOSIS — R7303 Prediabetes: Secondary | ICD-10-CM

## 2015-06-22 LAB — POCT GLYCOSYLATED HEMOGLOBIN (HGB A1C): Hemoglobin A1C: 6.3

## 2015-06-22 MED ORDER — GABAPENTIN 300 MG PO CAPS: 300.0000 mg | ORAL_CAPSULE | Freq: Three times a day (TID) | ORAL | Status: DC

## 2015-06-22 NOTE — Progress Notes (Signed)
Subjective:    Patient ID: Brandi Stephens, female    DOB: [birth year], 70 y.o.   MRN: [id number]  HPI Chief Complaint  Patient presents with  . week follow-up    2 week follow-up. left foot is numb from ball to toe, pricky,,   She is here with her daughter for follow up on HTN, LE edema, anxiety, and chronic pain to back and bilateral feet. We are also following up on elevated serum glucose on multiple occasions and plan to check an A1c today. She denies history of diabetes but states she thinks she was told in past that she had prediabetes. She is still getting PT and has a nurse coming to her home for assistance with ADLs. She states overall she if feeling much better. Is ambulating without a walker today.  Also complains of right shoulder pain with laying on her right side and with certain movements, pain started while she was in the skilled nursing facility a few weeks ago, denies injury. She had a negative right shoulder XR at that time. States pain has improved some but continues to bother her. Denies popping, locking, numbness, tingling or weakness to RUE. She is taking 1 Aleve daily every 12 hours and this helps some. She states she does not want referral to orthopedist but just wants to have it checked today.   Denies fever, chills, headache, dizziness, chest pain, palpitations, cough, DOE, orhopnea, GI or GU issues. Reports LE edema has improved significantly.   Back pain and neuropathy- States she saw her Neurosurgeon since our last visit for follow up on back surgery and was told she is making good progress and given ok to start driving again. She will f/u again in 6 month and 1 year. She states he told her that she is taking a low dose Gabapentin but can increase this for neuropathy and back pain.  She states she has been weaning herself off a lot of her medications. She has only taken tramadol once since our last visit and plans to stop taking this.  Taking Aleve 1 pill every 12  hours. Was taking ibuprofen 600 mg twice daily.   At our last visit she was taking hydroxizine and benadryl for itching. She stopped these medications and switched to [place]. Reports itching has improved.   Gout-Has taken colchicine in past for gout flares, but was told by insurance company that they would no longer cover this. She is taking daily allopurinol. No issues with this today.  Anxiety- She states she tapered off Lorezam completely. No longer taking it. Has been seeing Dr Milagros Reap- psychiatrist. He gave her the name of psychologists to also see for counseling but she states they do not accept her insurance and would like for me to recommend someone. Reports anxiety and sleep have improved.  Her biggest concern today is neuropathic pain and hoping to increase gabapentin.   Review of Systems Pertinent positives and negatives in the history of present illness.     Objective:   Physical Exam  Constitutional: She is oriented to person, place, and time. She appears well-developed and well-nourished. No distress.  Cardiovascular: Normal rate, regular rhythm, normal heart sounds and intact distal pulses.   LE edema trace bilaterally  Pulmonary/Chest: Effort normal and breath sounds normal.  Musculoskeletal:       Right shoulder: She exhibits tenderness and pain. She exhibits normal range of motion, no swelling, normal pulse and normal strength.       Arms: Neurological:  She is alert and oriented to person, place, and time.  Ambulating without walker  Skin: Skin is warm and dry. No rash noted. No cyanosis. No pallor.  Psychiatric: She has a normal mood and affect. Her speech is normal and behavior is normal. Judgment and thought content normal. Cognition and memory are normal.   BP 122/78 mmHg  Pulse 100  Wt 246 lb 3.2 oz (111.676 kg)  A1c 6.3    Assessment & Plan:  Essential hypertension  Generalized anxiety disorder  Dependent edema  Right shoulder pain  Prediabetes -  Plan: POCT glycosylated hemoglobin (Hb A1C)  Chronic pain - Plan: gabapentin (NEURONTIN) 300 MG capsule  Suspect that we need to increase gabapentin dose for neuropathic pain. Will increase all 3 daily doses by 100 mg over next 2-3 days until she is taking 300 mg twice daily and 600 mg at bedtime, as long as she is tolerating this dose and it is beneficial.  Discussed increasing aleve to 2 pills twice daily with food for shoulder pain and see if this helps. Discussed possibility of injection in near future or referral to orthopedist. She would like to hold off on this and see if Aleve helps.  Discussed diabetes spectrum and that she is currently in prediabetes category. Recommend she watch sugar and carbohydrate intake.  LE edema has improved since last visit. May consider reducing lasix at next appointment or sooner if blood pressure readings are low.  She will continue getting home health visits for now.  She is no longer requiring a walker and seems to be getting stronger.  Overall she is improving and happy with her success at this point.  Recommend she continue seeing psychiatrist, referral made for [facility] for counseling, her psychiatrist told her she should do this for anxiety and avoid medication and I agree.  Discussed staying well hydrated and letting me know if blood pressures are not within goal range.  Follow up in 4-6 weeks or sooner if needed.

## 2015-06-23 ENCOUNTER — Telehealth: Payer: Self-pay | Admitting: Internal Medicine

## 2015-06-23 DIAGNOSIS — G8929 Other chronic pain: Secondary | ICD-10-CM

## 2015-06-23 DIAGNOSIS — R7989 Other specified abnormal findings of blood chemistry: Secondary | ICD-10-CM

## 2015-06-23 MED ORDER — ATORVASTATIN CALCIUM 20 MG PO TABS
20.0000 mg | ORAL_TABLET | Freq: Every day | ORAL | Status: DC
Start: 2015-06-23 — End: 2016-01-20

## 2015-06-23 MED ORDER — GABAPENTIN 300 MG PO CAPS: ORAL_CAPSULE | ORAL | Status: DC

## 2015-06-23 NOTE — Telephone Encounter (Signed)
Pt called to state that her spiriva had not been sent yet to her and gabapentin was not sent to her yet. Called to correct med for gabapentin and asked that she expedite this and spriiva went out on feb 6th and received at pt's address on feb 9th @ 9:07am Tracking # [id number] and order # [id number] and call clinical services [phone]. And told her to call and talk about sprivia to get shipped to her.   Pt is to come back in 2 weeks for cmp recheck. i have put in future order

## 2015-06-29 ENCOUNTER — Ambulatory Visit (INDEPENDENT_AMBULATORY_CARE_PROVIDER_SITE_OTHER): Payer: Commercial Managed Care - HMO | Admitting: Family Medicine

## 2015-06-29 ENCOUNTER — Ambulatory Visit: Payer: Medicare HMO | Admitting: Licensed Clinical Social Worker

## 2015-06-29 ENCOUNTER — Other Ambulatory Visit: Payer: Self-pay

## 2015-06-29 ENCOUNTER — Encounter: Payer: Self-pay | Admitting: Family Medicine

## 2015-06-29 VITALS — BP 140/80 | HR 64 | Wt 250.4 lb

## 2015-06-29 DIAGNOSIS — N63 Unspecified lump in breast: Secondary | ICD-10-CM | POA: Diagnosis not present

## 2015-06-29 DIAGNOSIS — R5381 Other malaise: Secondary | ICD-10-CM

## 2015-06-29 DIAGNOSIS — N6310 Unspecified lump in the right breast, unspecified quadrant: Secondary | ICD-10-CM

## 2015-06-29 NOTE — Progress Notes (Signed)
   Subjective:    Patient ID: Brandi Stephens, female    DOB: [birth year], 70 y.o.   MRN: [id number]  HPI Chief Complaint  Patient presents with  . lump on breast    right side- size of a quarter.    She is here with complaints of a mass to her right breast that she found last night. States the area is tender.  Last mammogram 3 years ago. Has had breast reduction in past.  Lumpectomy in her 37s.  States she has a history of breast cancer in aunt.  Denies fever, chills, fatigue, unexplained weight loss.    Review of Systems Pertinent positives and negatives in the history of present illness.     Objective:   Physical Exam  Pulmonary/Chest: Right breast exhibits tenderness. Right breast exhibits no nipple discharge and no skin change.    Unable to do complete breast exam due to right breast tenderness to palpation. No discreet mass palpable. No skin changes.   BP 140/80 mmHg  Pulse 64  Wt 250 lb 6.4 oz (113.581 kg)      Assessment & Plan:  Breast mass, right  Reassured her that the majority of breast masses are actually benign. Patient unable to tolerate complete breast exam due to tenderness. Recommend diagnostic imaging and further evaluation. Referral made to breast center.

## 2015-06-30 ENCOUNTER — Ambulatory Visit: Payer: Medicare HMO | Admitting: Licensed Clinical Social Worker

## 2015-06-30 ENCOUNTER — Ambulatory Visit (INDEPENDENT_AMBULATORY_CARE_PROVIDER_SITE_OTHER): Payer: Medicare HMO | Admitting: Psychiatry

## 2015-06-30 DIAGNOSIS — F4323 Adjustment disorder with mixed anxiety and depressed mood: Secondary | ICD-10-CM

## 2015-07-02 ENCOUNTER — Ambulatory Visit
Admission: RE | Admit: 2015-07-02 | Discharge: 2015-07-02 | Disposition: A | Discharge status: Home / Self Care | Payer: Medicare HMO | Source: Ambulatory Visit | Attending: Family Medicine | Admitting: Family Medicine

## 2015-07-02 ENCOUNTER — Other Ambulatory Visit: Payer: Self-pay | Admitting: Family Medicine

## 2015-07-02 DIAGNOSIS — N6310 Unspecified lump in the right breast, unspecified quadrant: Secondary | ICD-10-CM

## 2015-07-02 DIAGNOSIS — N6320 Unspecified lump in the left breast, unspecified quadrant: Secondary | ICD-10-CM

## 2015-07-07 ENCOUNTER — Other Ambulatory Visit: Payer: Commercial Managed Care - HMO

## 2015-07-07 DIAGNOSIS — R7989 Other specified abnormal findings of blood chemistry: Secondary | ICD-10-CM

## 2015-07-08 ENCOUNTER — Encounter: Payer: Self-pay | Admitting: Family Medicine

## 2015-07-08 DIAGNOSIS — N183 Chronic kidney disease, stage 3 unspecified: Secondary | ICD-10-CM | POA: Insufficient documentation

## 2015-07-08 DIAGNOSIS — N189 Chronic kidney disease, unspecified: Secondary | ICD-10-CM | POA: Insufficient documentation

## 2015-07-08 DIAGNOSIS — R7303 Prediabetes: Secondary | ICD-10-CM | POA: Insufficient documentation

## 2015-07-08 DIAGNOSIS — N179 Acute kidney failure, unspecified: Secondary | ICD-10-CM | POA: Insufficient documentation

## 2015-07-08 LAB — COMPREHENSIVE METABOLIC PANEL
ALK PHOS: 78 U/L (ref 33–130)
ALT: 12 U/L (ref 6–29)
AST: 13 U/L (ref 10–35)
Albumin: 3.7 g/dL (ref 3.6–5.1)
BUN: 38 mg/dL — AB (ref 7–25)
CO2: 23 mmol/L (ref 20–31)
CREATININE: 1.14 mg/dL — AB (ref 0.50–0.99)
Calcium: 9.4 mg/dL (ref 8.6–10.4)
Chloride: 101 mmol/L (ref 98–110)
Glucose, Bld: 114 mg/dL — ABNORMAL HIGH (ref 65–99)
Potassium: 5.1 mmol/L (ref 3.5–5.3)
SODIUM: 139 mmol/L (ref 135–146)
TOTAL PROTEIN: 6 g/dL — AB (ref 6.1–8.1)
Total Bilirubin: 0.4 mg/dL (ref 0.2–1.2)

## 2015-07-09 ENCOUNTER — Ambulatory Visit (INDEPENDENT_AMBULATORY_CARE_PROVIDER_SITE_OTHER): Payer: Medicare HMO | Admitting: Psychiatry

## 2015-07-09 DIAGNOSIS — F4323 Adjustment disorder with mixed anxiety and depressed mood: Secondary | ICD-10-CM

## 2015-07-13 ENCOUNTER — Other Ambulatory Visit: Payer: Self-pay | Admitting: Family Medicine

## 2015-07-13 ENCOUNTER — Ambulatory Visit
Admission: RE | Admit: 2015-07-13 | Discharge: 2015-07-13 | Disposition: A | Discharge status: Home / Self Care | Payer: Medicare HMO | Source: Ambulatory Visit | Attending: Family Medicine | Admitting: Family Medicine

## 2015-07-13 DIAGNOSIS — N6320 Unspecified lump in the left breast, unspecified quadrant: Secondary | ICD-10-CM

## 2015-07-13 DIAGNOSIS — N6310 Unspecified lump in the right breast, unspecified quadrant: Secondary | ICD-10-CM

## 2015-07-13 HISTORY — PX: BREAST BIOPSY: SHX20

## 2015-07-14 ENCOUNTER — Encounter: Payer: Self-pay | Admitting: *Deleted

## 2015-07-14 ENCOUNTER — Telehealth: Payer: Self-pay | Admitting: *Deleted

## 2015-07-14 DIAGNOSIS — Z170 Estrogen receptor positive status [ER+]: Secondary | ICD-10-CM

## 2015-07-14 DIAGNOSIS — C50511 Malignant neoplasm of lower-outer quadrant of right female breast: Secondary | ICD-10-CM

## 2015-07-14 DIAGNOSIS — Z853 Personal history of malignant neoplasm of breast: Secondary | ICD-10-CM | POA: Insufficient documentation

## 2015-07-14 DIAGNOSIS — C50212 Malignant neoplasm of upper-inner quadrant of left female breast: Secondary | ICD-10-CM | POA: Insufficient documentation

## 2015-07-14 NOTE — Telephone Encounter (Signed)
Left vm for pt to return call regarding [place] for 4.5.17. Contact information provided.

## 2015-07-14 NOTE — Telephone Encounter (Signed)
Confirmed [facility] for 07/22/15 at 1215 .  Instructions and contact information given.

## 2015-07-16 ENCOUNTER — Ambulatory Visit (INDEPENDENT_AMBULATORY_CARE_PROVIDER_SITE_OTHER): Payer: Medicare HMO | Admitting: Psychiatry

## 2015-07-16 DIAGNOSIS — F4323 Adjustment disorder with mixed anxiety and depressed mood: Secondary | ICD-10-CM | POA: Diagnosis not present

## 2015-07-20 ENCOUNTER — Telehealth: Payer: Self-pay | Admitting: Internal Medicine

## 2015-07-20 NOTE — Telephone Encounter (Signed)
Pt called and says she has 6 days left on her qvar and don't want to run out. She wants to know if you can refill it for her to [place]. Also patient has only been doing gabpentin at night and taking the 600 mg as she is having a lot of nerve pain in her left foot. She wants to know if you can give her some tramadol to help relieve this pain. Pt is aware you will be out until Wednesday and can wait until you get back

## 2015-07-21 ENCOUNTER — Telehealth: Payer: Self-pay | Admitting: Family Medicine

## 2015-07-21 ENCOUNTER — Telehealth: Payer: Self-pay | Admitting: Internal Medicine

## 2015-07-21 ENCOUNTER — Telehealth: Payer: Self-pay | Admitting: Oncology

## 2015-07-21 NOTE — Telephone Encounter (Signed)
Tried to call [name] back but the voicemail didn't sound right, so i didn't leave a message  Did referral and Referral Approval is # [phone]

## 2015-07-21 NOTE — Telephone Encounter (Signed)
Cancer center called for a referral for [employer].   Dx code- C50.11 Dr. Kyung Rudd- NPI- [id number] [employer] ID- [id number]    Approved # [id number]

## 2015-07-21 NOTE — Telephone Encounter (Signed)
Referral obtained from Dr. Lanice Shirts office for 07/22/15 new patient appointment with [facility]. Spoke with Leighton Parody at Dr. Lanice Shirts office and referral is visible on the [employer] website.

## 2015-07-21 NOTE — Telephone Encounter (Signed)
Brandi Stephens with Dr. Jana Hakim called from Cancer center.  Pt has appt there tomorrow at 1:00.   Dr. Jana Hakim NPI [id number]     DX C50.511 Breast Cancer. They need [employer] referral today.  Please call Brandi Stephens at [phone]

## 2015-07-22 ENCOUNTER — Ambulatory Visit (HOSPITAL_BASED_OUTPATIENT_CLINIC_OR_DEPARTMENT_OTHER): Payer: Commercial Managed Care - HMO | Admitting: Oncology

## 2015-07-22 ENCOUNTER — Ambulatory Visit: Payer: Commercial Managed Care - HMO | Attending: General Surgery | Admitting: Physical Therapy

## 2015-07-22 ENCOUNTER — Encounter: Payer: Self-pay | Admitting: Oncology

## 2015-07-22 ENCOUNTER — Ambulatory Visit
Admission: RE | Admit: 2015-07-22 | Discharge: 2015-07-22 | Disposition: A | Discharge status: Home / Self Care | Payer: Medicare HMO | Source: Ambulatory Visit | Attending: Radiation Oncology | Admitting: Radiation Oncology

## 2015-07-22 ENCOUNTER — Other Ambulatory Visit: Payer: Self-pay | Admitting: General Surgery

## 2015-07-22 ENCOUNTER — Other Ambulatory Visit (HOSPITAL_BASED_OUTPATIENT_CLINIC_OR_DEPARTMENT_OTHER): Payer: Commercial Managed Care - HMO

## 2015-07-22 ENCOUNTER — Encounter: Payer: Self-pay | Admitting: Physical Therapy

## 2015-07-22 ENCOUNTER — Telehealth: Payer: Self-pay | Admitting: Oncology

## 2015-07-22 VITALS — BP 124/85 | HR 89 | Temp 97.9°F | Resp 17 | Ht 69.0 in | Wt 248.4 lb

## 2015-07-22 DIAGNOSIS — Z170 Estrogen receptor positive status [ER+]: Secondary | ICD-10-CM

## 2015-07-22 DIAGNOSIS — R293 Abnormal posture: Secondary | ICD-10-CM | POA: Insufficient documentation

## 2015-07-22 DIAGNOSIS — R2689 Other abnormalities of gait and mobility: Secondary | ICD-10-CM | POA: Diagnosis present

## 2015-07-22 DIAGNOSIS — C50912 Malignant neoplasm of unspecified site of left female breast: Secondary | ICD-10-CM | POA: Insufficient documentation

## 2015-07-22 DIAGNOSIS — M6281 Muscle weakness (generalized): Secondary | ICD-10-CM | POA: Insufficient documentation

## 2015-07-22 DIAGNOSIS — C50511 Malignant neoplasm of lower-outer quadrant of right female breast: Secondary | ICD-10-CM

## 2015-07-22 DIAGNOSIS — C50212 Malignant neoplasm of upper-inner quadrant of left female breast: Secondary | ICD-10-CM

## 2015-07-22 DIAGNOSIS — I10 Essential (primary) hypertension: Secondary | ICD-10-CM | POA: Diagnosis not present

## 2015-07-22 DIAGNOSIS — M5442 Lumbago with sciatica, left side: Secondary | ICD-10-CM | POA: Diagnosis present

## 2015-07-22 LAB — COMPREHENSIVE METABOLIC PANEL
ALT: 13 U/L (ref 0–55)
ANION GAP: 8 meq/L (ref 3–11)
AST: 13 U/L (ref 5–34)
Albumin: 3.6 g/dL (ref 3.5–5.0)
Alkaline Phosphatase: 83 U/L (ref 40–150)
BUN: 25 mg/dL (ref 7.0–26.0)
CALCIUM: 9.8 mg/dL (ref 8.4–10.4)
CHLORIDE: 106 meq/L (ref 98–109)
CO2: 26 meq/L (ref 22–29)
Creatinine: 1.3 mg/dL — ABNORMAL HIGH (ref 0.6–1.1)
EGFR: 43 mL/min/{1.73_m2} — ABNORMAL LOW (ref >90)
Glucose: 100 mg/dl (ref 70–140)
POTASSIUM: 5.1 meq/L (ref 3.5–5.1)
Sodium: 140 mEq/L (ref 136–145)
Total Bilirubin: 0.3 mg/dL (ref 0.20–1.20)
Total Protein: 7.1 g/dL (ref 6.4–8.3)

## 2015-07-22 LAB — CBC WITH DIFFERENTIAL/PLATELET
BASO%: 1.3 % (ref 0.0–2.0)
BASOS ABS: 0.1 10*3/uL (ref 0.0–0.1)
EOS%: 3.1 % (ref 0.0–7.0)
Eosinophils Absolute: 0.3 10*3/uL (ref 0.0–0.5)
HEMATOCRIT: 44.1 % (ref 34.8–46.6)
HGB: 14.4 g/dL (ref 11.6–15.9)
LYMPH#: 2.1 10*3/uL (ref 0.9–3.3)
LYMPH%: 20 % (ref 14.0–49.7)
MCH: 30.3 pg (ref 25.1–34.0)
MCHC: 32.6 g/dL (ref 31.5–36.0)
MCV: 93 fL (ref 79.5–101.0)
MONO#: 0.8 10*3/uL (ref 0.1–0.9)
MONO%: 8.1 % (ref 0.0–14.0)
NEUT#: 7 10*3/uL — ABNORMAL HIGH (ref 1.5–6.5)
NEUT%: 67.5 % (ref 38.4–76.8)
PLATELETS: 283 10*3/uL (ref 145–400)
RBC: 4.74 10*6/uL (ref 3.70–5.45)
RDW: 15.1 % — ABNORMAL HIGH (ref 11.2–14.5)
WBC: 10.4 10*3/uL — ABNORMAL HIGH (ref 3.9–10.3)

## 2015-07-22 LAB — TECHNOLOGIST REVIEW

## 2015-07-22 MED ORDER — BECLOMETHASONE DIPROPIONATE 80 MCG/ACT IN AERS: 2.0000 | INHALATION_SPRAY | Freq: Two times a day (BID) | RESPIRATORY_TRACT | Status: DC

## 2015-07-22 MED ORDER — TRAMADOL HCL 50 MG PO TABS: 50.0000 mg | ORAL_TABLET | Freq: Four times a day (QID) | ORAL | Status: DC | PRN

## 2015-07-22 NOTE — Progress Notes (Signed)
Radiation Oncology         [phone]) [phone] ________________________________  Name: Brandi Stephens MRN: [id number]  Date: 07/22/2015  DOB: December 27, 1945  BM:WUXLKG Brandi Rover, NP  Stark Klein, MD     REFERRING PHYSICIAN: Stark Klein, MD   DIAGNOSIS: The encounter diagnosis was Breast cancer of lower-outer quadrant of right female breast ([place]).   HISTORY OF PRESENT ILLNESS: Brandi Stephens is a 70 y.o. female seen in the [facility] breast clinic for a new diagnosis of invasive ductal carcinoma of the left breast. She had noted a palpable mass in the left breast for one month, and ultimately had a mammogram on 07/02/15 revealing a mass in the right breast. She underwent an ultrasound as well that revealed a 1.4 x 1.2 cm.  The right axilla did not reveal any visible abnormalities. A biopsy of this revealed fibrocystic change. Her mammogram also revealed subtle abnormality of the left, and an ultrasound was unrevealing. She had a biopsy performed on 07/13/15 on the left that revealed invasive ductal carcinoma, grade 1 with atypical lobular neoplasia. She comes today for further discussion of treatment, and specifically with Dr. Lisbeth Stephens to discuss the role of radiotherapy. Of note she had an excisional biopsy of a benign finding of the same breast in 1972, and a bilateral breast reduction that removed this excisional scar in 1996.   PREVIOUS RADIATION THERAPY: No   PAST MEDICAL HISTORY:  Past Medical History  Diagnosis Date  . Hypertension   . Arthritis   . Hemorrhoids   . PONV (postoperative nausea and vomiting)   . Hypothyroidism   . Breast cancer of lower-outer quadrant of right female breast ([place]) 07/14/2015  . COPD (chronic obstructive pulmonary disease) ([place])   . Anxiety        PAST SURGICAL HISTORY: Past Surgical History  Procedure Laterality Date  . Basal cell carcinoma excision  10/15    nose  . Abdominal hysterectomy  1985  . Breast reduction surgery  1998  . Cervical spine surgery   1995    hallo  . Knee arthroscopy Right 2002  . Colectomy  2005    right side  . Hemorrhoid surgery  2008  . Tonsillectomy    . Tubal ligation    . Hand surgery Left   . Eye surgery Left 07/2014    cataract removed 3 months later     FAMILY HISTORY:  Family History  Problem Relation Age of Onset  . Breast cancer Maternal Aunt   . Lung cancer Maternal Uncle   . Stomach cancer Maternal Grandmother   . Lung cancer Maternal Aunt   . Brain cancer Maternal Aunt      SOCIAL HISTORY:  reports that she quit smoking about 5 years ago. Her smoking use included Cigarettes. She has a 35 pack-year smoking history. She does not have any smokeless tobacco history on file. She reports that she drinks about 0.6 oz of alcohol per week. She reports that she does not use illicit drugs. She is married and resides in [place].  She is retired.   ALLERGIES: Morphine and related and Penicillins   MEDICATIONS:  Current Outpatient Prescriptions  Medication Sig Dispense Refill  . allopurinol (ZYLOPRIM) 100 MG tablet Take 1 tablet by mouth daily.    Marland Kitchen atorvastatin (LIPITOR) 20 MG tablet Take 1 tablet (20 mg total) by mouth daily. 90 tablet 1  . beclomethasone (QVAR) 80 MCG/ACT inhaler Inhale 2 puffs into the lungs 2 (two) times daily.    Marland Kitchen  furosemide (LASIX) 20 MG tablet Take 20 mg by mouth. Reported on 06/08/2015    . gabapentin (NEURONTIN) 300 MG capsule Take 1 tablet morning and afternoon and 2 tablets at bedtime 90 capsule 3  . KLOR-CON 10 10 MEQ tablet Take 10 mEq by mouth as needed.    Marland Kitchen levothyroxine (SYNTHROID, LEVOTHROID) 75 MCG tablet Take 75 mcg by mouth daily.    Marland Kitchen MAGNESIUM CITRATE PO Take 2 tablets by mouth at bedtime. Reported on 06/08/2015    . tiotropium (SPIRIVA) 18 MCG inhalation capsule Place 1 capsule (18 mcg total) into inhaler and inhale daily. 90 capsule 3  . traMADol (ULTRAM) 50 MG tablet Take 50 mg by mouth every 6 (six) hours as needed. Reported on 06/08/2015    .  valsartan-hydrochlorothiazide (DIOVAN-HCT) 80-12.5 MG tablet Take 1 tablet by mouth daily.     No current facility-administered medications for this encounter.     REVIEW OF SYSTEMS: On review of systems, the patient reports that she is doing well overall. She denies any chest pain, shortness of breath, cough, fevers, chills, night sweats, unintended weight changes. Her breast soreness is still uncomfortable, however she denies any bowel or bladder disturbances, and denies abdominal pain, nausea or vomiting. She denies any new musculoskeletal or joint aches or pains. A complete review of systems is obtained and is otherwise negative.     PHYSICAL EXAM:  Wt 248 lb Height 5'9" BP 124/85 P 89 RR 17 T 97.9 F Pulse ox 94% Pain scale 0/10 In general this is a well appearing Caucasian female in no acute distress. She is alert and oriented x4 and appropriate throughout the examination. HEENT reveals that the patient is normocephalic, atraumatic. EOMs are intact. PERRLA. Skin is intact without any evidence of gross lesions. Cardiovascular exam reveals a regular rate and rhythm, no clicks rubs or murmurs are auscultated. Chest is clear to auscultation bilaterally. Lymphatic assessment is performed and does not reveal any adenopathy in the cervical, supraclavicular, axillary, or inguinal chains. Breast exam reveals post biopsy changes along bilateral breasts. no palpable masses or nodules are appreciated. Minimal ecchymoses are noted, no nipple discharge or bleeding is noted bilaterally. Abdomen has active bowel sounds in all quadrants and is intact. The abdomen is soft, non tender, non distended. Lower extremities are negative for pretibial pitting edema, deep calf tenderness, cyanosis or clubbing.   ECOG = 1  0 - Asymptomatic (Fully active, able to carry on all predisease activities without restriction)  1 - Symptomatic but completely ambulatory (Restricted in physically strenuous activity but  ambulatory and able to carry out work of a light or sedentary nature. For example, light housework, office work)  2 - Symptomatic, <50% in bed during the day (Ambulatory and capable of all self care but unable to carry out any work activities. Up and about more than 50% of waking hours)  3 - Symptomatic, >50% in bed, but not bedbound (Capable of only limited self-care, confined to bed or chair 50% or more of waking hours)  4 - Bedbound (Completely disabled. Cannot carry on any self-care. Totally confined to bed or chair)  5 - Death   Eustace Pen MM, Creech RH, Tormey DC, et al. [phone]). "Toxicity and response criteria of the [facility]". [place] Oncol. 5 (6): 649-55    LABORATORY DATA:  Lab Results  Component Value Date   WBC 10.4* 07/22/2015   HGB 14.4 07/22/2015   HCT 44.1 07/22/2015   MCV 93.0 07/22/2015  PLT 283 07/22/2015   Lab Results  Component Value Date   NA 140 07/22/2015   K 5.1 07/22/2015   CL 101 07/07/2015   CO2 26 07/22/2015   Lab Results  Component Value Date   ALT 13 07/22/2015   AST 13 07/22/2015   ALKPHOS 83 07/22/2015   BILITOT 0.30 07/22/2015      RADIOGRAPHY: US Breast Ltd Uni Left Inc Axilla  07/02/2015  CLINICAL DATA:  70 year old female with focal right breast pain and lump for 1 month. The patient has a history of bilateral breast reductions in 1996. The patient also has a history of a left excisional biopsy in 1972. The patient states that the scar from the excisional biopsy was beneath her left nipple but that this scar was removed at the time of her breast reductions. EXAM: DIGITAL DIAGNOSTIC BILATERAL MAMMOGRAM WITH 3D TOMOSYNTHESIS WITH CAD ULTRASOUND BILATERAL BREAST COMPARISON:  Previous exam(s). ACR Breast Density Category c: The breast tissue is heterogeneously dense, which may obscure small masses. FINDINGS: Postreduction changes are noted bilaterally. No suspicious mass, calcifications, or other abnormality is  identified within the right breast. There is an area of distortion within the upper, inner left breast, middle depth. Mammographic images were processed with CAD. On physical exam, no discrete mass is felt in the patient's area of concern in the lower, outer right breast. Targeted ultrasound of the right breast was performed in the area of patient's focal pain demonstrating an area of distortion within the right breast at 8 o'clock, 12 cm from the nipple measuring approximately 14 x 12 mm in the antiradial plane. This area was only well seen in the anti radial plane and is thought to most likely represent scarring related to patient's prior reduction surgery. Targeted ultrasound of the left breast was performed demonstrating no definite correlate for the area of distortion seen mammographically. Ultrasound evaluation of both axilla demonstrates no suspicious axillary lymph nodes. IMPRESSION: 1. Indeterminate left breast distortion. 2. Area of right breast distortion seen sonographically in the area of patient's focal pain. While this is favored to represent scarring related to patient's prior reduction surgery, biopsy is recommended to exclude malignancy. RECOMMENDATION: 1. Affirm guided biopsy of the left breast distortion. 2. Ultrasound-guided biopsy of the area of distortion within the right breast at 8 o'clock, 12 cm from the nipple. I have discussed the findings and recommendations with the patient. Results were also provided in writing at the conclusion of the visit. If applicable, a reminder letter will be sent to the patient regarding the next appointment. BI-RADS CATEGORY  4: Suspicious. Electronically Signed   By: Pamelia Hoit M.D.   On: 07/02/2015 17:31   US Breast Ltd Uni Right Inc Axilla  07/02/2015  CLINICAL DATA:  70 year old female with focal right breast pain and lump for 1 month. The patient has a history of bilateral breast reductions in 1996. The patient also has a history of a left excisional  biopsy in 1972. The patient states that the scar from the excisional biopsy was beneath her left nipple but that this scar was removed at the time of her breast reductions. EXAM: DIGITAL DIAGNOSTIC BILATERAL MAMMOGRAM WITH 3D TOMOSYNTHESIS WITH CAD ULTRASOUND BILATERAL BREAST COMPARISON:  Previous exam(s). ACR Breast Density Category c: The breast tissue is heterogeneously dense, which may obscure small masses. FINDINGS: Postreduction changes are noted bilaterally. No suspicious mass, calcifications, or other abnormality is identified within the right breast. There is an area of distortion within the upper, inner  left breast, middle depth. Mammographic images were processed with CAD. On physical exam, no discrete mass is felt in the patient's area of concern in the lower, outer right breast. Targeted ultrasound of the right breast was performed in the area of patient's focal pain demonstrating an area of distortion within the right breast at 8 o'clock, 12 cm from the nipple measuring approximately 14 x 12 mm in the antiradial plane. This area was only well seen in the anti radial plane and is thought to most likely represent scarring related to patient's prior reduction surgery. Targeted ultrasound of the left breast was performed demonstrating no definite correlate for the area of distortion seen mammographically. Ultrasound evaluation of both axilla demonstrates no suspicious axillary lymph nodes. IMPRESSION: 1. Indeterminate left breast distortion. 2. Area of right breast distortion seen sonographically in the area of patient's focal pain. While this is favored to represent scarring related to patient's prior reduction surgery, biopsy is recommended to exclude malignancy. RECOMMENDATION: 1. Affirm guided biopsy of the left breast distortion. 2. Ultrasound-guided biopsy of the area of distortion within the right breast at 8 o'clock, 12 cm from the nipple. I have discussed the findings and recommendations with the  patient. Results were also provided in writing at the conclusion of the visit. If applicable, a reminder letter will be sent to the patient regarding the next appointment. BI-RADS CATEGORY  4: Suspicious. Electronically Signed   By: Pamelia Hoit M.D.   On: 07/02/2015 17:31   Mm Diag Breast Tomo Bilateral  07/13/2015  CLINICAL DATA:  70 year old female status post tomosynthesis guided left breast biopsy and ultrasound-guided right breast biopsy EXAM: 3D DIAGNOSTIC BILATERAL MAMMOGRAM POST STEREOTACTIC BIOPSY OF THE LEFT BREAST AND ULTRASOUND BIOPSY OF THE RIGHT BREAST COMPARISON:  Previous exam(s). FINDINGS: 3D Mammographic images were obtained of the left breast following tomosynthesis guided biopsy of a left breast distortion. Post biopsy images demonstrate the coil shaped biopsy marker to be approximately 8 mm medial to the expected location of the distortion within the upper, inner left breast. 2D mammographic images were obtained of the right breast following ultrasound-guided biopsy. Post biopsy images demonstrate the heart shaped biopsy marker in the expected location within the slightly lower, outer right breast. IMPRESSION: Bilateral marker placement as above. Final Assessment: Post Procedure Mammograms for Marker Placement Electronically Signed   By: Pamelia Hoit M.D.   On: 07/13/2015 11:45   Mm Diag Breast Tomo Bilateral  07/02/2015  CLINICAL DATA:  70 year old female with focal right breast pain and lump for 1 month. The patient has a history of bilateral breast reductions in 1996. The patient also has a history of a left excisional biopsy in 1972. The patient states that the scar from the excisional biopsy was beneath her left nipple but that this scar was removed at the time of her breast reductions. EXAM: DIGITAL DIAGNOSTIC BILATERAL MAMMOGRAM WITH 3D TOMOSYNTHESIS WITH CAD ULTRASOUND BILATERAL BREAST COMPARISON:  Previous exam(s). ACR Breast Density Category c: The breast tissue is heterogeneously  dense, which may obscure small masses. FINDINGS: Postreduction changes are noted bilaterally. No suspicious mass, calcifications, or other abnormality is identified within the right breast. There is an area of distortion within the upper, inner left breast, middle depth. Mammographic images were processed with CAD. On physical exam, no discrete mass is felt in the patient's area of concern in the lower, outer right breast. Targeted ultrasound of the right breast was performed in the area of patient's focal pain demonstrating an area of  distortion within the right breast at 8 o'clock, 12 cm from the nipple measuring approximately 14 x 12 mm in the antiradial plane. This area was only well seen in the anti radial plane and is thought to most likely represent scarring related to patient's prior reduction surgery. Targeted ultrasound of the left breast was performed demonstrating no definite correlate for the area of distortion seen mammographically. Ultrasound evaluation of both axilla demonstrates no suspicious axillary lymph nodes. IMPRESSION: 1. Indeterminate left breast distortion. 2. Area of right breast distortion seen sonographically in the area of patient's focal pain. While this is favored to represent scarring related to patient's prior reduction surgery, biopsy is recommended to exclude malignancy. RECOMMENDATION: 1. Affirm guided biopsy of the left breast distortion. 2. Ultrasound-guided biopsy of the area of distortion within the right breast at 8 o'clock, 12 cm from the nipple. I have discussed the findings and recommendations with the patient. Results were also provided in writing at the conclusion of the visit. If applicable, a reminder letter will be sent to the patient regarding the next appointment. BI-RADS CATEGORY  4: Suspicious. Electronically Signed   By: Pamelia Hoit M.D.   On: 07/02/2015 17:31   Mm Lt Breast Bx W Loc Dev 1st Lesion Image Bx Spec Stereo Guide  07/15/2015  ADDENDUM REPORT:  07/15/2015 08:20 ADDENDUM: Pathology revealed grade I invasive ductal carcinoma, atypical lobular hyperplasia, and fibrocystic changes with adenosis and calcifications in the left breast. The right breast showed fibrocystic changes with adenosis and calcifications. This was found to be concordant by Dr. Pamelia Hoit. Pathology results were discussed with the patient by telephone. The patient reported doing well after the biopsy. Post biopsy instructions and care were reviewed and questions were answered. The patient was encouraged to call [facility] for any additional concerns. The patient was referred to the [facility] at the [facility] on July 22, 2015. Pathology results reported by Susa Raring RN, BSN on 07/15/2015. Electronically Signed   By: Pamelia Hoit M.D.   On: 07/15/2015 08:20  07/15/2015  CLINICAL DATA:  70 year old female for tomosynthesis guided biopsy of a left breast distortion EXAM: LEFT BREAST STEREOTACTIC CORE NEEDLE BIOPSY COMPARISON:  Previous exams. FINDINGS: The patient and I discussed the procedure of stereotactic/tomosynthesis -guided biopsy including benefits and alternatives. We discussed the high likelihood of a successful procedure. We discussed the risks of the procedure including infection, bleeding, tissue injury, clip migration, and inadequate sampling. Informed written consent was given. The usual time out protocol was performed immediately prior to the procedure. Using sterile technique and 1% Lidocaine as local anesthetic, under stereotactic guidance, a 9 gauge vacuum assist device was used to perform core needle biopsy of a distortion within the upper, inner left breast using a superior to inferior approach. At the conclusion of the procedure, a coil shaped tissue marker clip was deployed into the biopsy cavity. Follow-up 2-view mammogram was performed and dictated separately. IMPRESSION: Stereotactic/  tomosynthesis -guided biopsy of a left breast distortion. No apparent complications. Electronically Signed: By: Pamelia Hoit M.D. On: 07/13/2015 11:21   [place] Rt Breast Bx W Loc Dev 1st Lesion Img Bx Spec US Guide  07/15/2015  ADDENDUM REPORT: 07/15/2015 08:20 ADDENDUM: Pathology revealed grade I invasive ductal carcinoma, atypical lobular hyperplasia, and fibrocystic changes with adenosis and calcifications in the left breast. The right breast showed fibrocystic changes with adenosis and calcifications. This was found to be concordant by Dr. Junie Panning  Brigitte Pulse. Pathology results were discussed with the patient by telephone. The patient reported doing well after the biopsy. Post biopsy instructions and care were reviewed and questions were answered. The patient was encouraged to call [facility] for any additional concerns. The patient was referred to the [facility] at the [facility] on July 22, 2015. Pathology results reported by Susa Raring RN, BSN on 07/15/2015. Electronically Signed   By: Pamelia Hoit M.D.   On: 07/15/2015 08:20  07/15/2015  CLINICAL DATA:  70 year old female for ultrasound-guided biopsy of a possible subtle area of distortion within the right breast at 8 o'clock, 12 cm from the nipple EXAM: ULTRASOUND GUIDED RIGHT BREAST CORE NEEDLE BIOPSY COMPARISON:  Previous exam(s). PROCEDURE: I met with the patient and we discussed the procedure of ultrasound-guided biopsy, including benefits and alternatives. We discussed the high likelihood of a successful procedure. We discussed the risks of the procedure including infection, bleeding, tissue injury, clip migration, and inadequate sampling. Informed written consent was given. The usual time-out protocol was performed immediately prior to the procedure. Using sterile technique and 1% Lidocaine as local anesthetic, under direct ultrasound visualization, a 12 gauge vacuum-assisted device was  used to perform biopsy of a possible subtle area of distortion within the right breast at 8 o'clock, 12 cm from the nipple using a lateral to medial approach. At the conclusion of the procedure, a heart shaped tissue marker clip was deployed into the biopsy cavity. Follow-up 2-view mammogram was performed and dictated separately. IMPRESSION: Ultrasound-guided biopsy of the right breast. No apparent complications. Electronically Signed: By: Pamelia Hoit M.D. On: 07/13/2015 11:44       IMPRESSION: cT1, N0M0 ER/PR positive, HER 2 negative invasive ductal carcinoma of the left breast.   PLAN: Dr. Lisbeth Stephens discusses the pathology findings and reviews the nature of invasive breast disease. The consensus from the breast conference include probable breast conservation with lumpectomy with sentinel node evaluation, oncotype, followed by external radiotherapy to the breast followed by antiestrogen therapy. Dr. Lisbeth Stephens discusses the role of radiotherapy to reduce the risk of recurrence, and outlines the recommendations for hypofractionated radiotherapy with a total of 20 fractions. He discusses the risks, benefits, short term, and long term risks of radiotherapy. He also discusses possible breath hold technique due to her left sided disease. We will await her pathology and oncotype testing results, and if she is not in need of chemotherapy, we will plan to see her back 2-3 weeks after surgery and anticipate beginning radiation 4 weeks postoperatively.    The above documentation reflects my direct findings during this shared patient visit. Please see the separate note by Dr. Lisbeth Stephens on this date for the remainder of the patient's plan of care.    Carola Rhine, PAC

## 2015-07-22 NOTE — Telephone Encounter (Signed)
appt made avs will print at the end of visit

## 2015-07-22 NOTE — Progress Notes (Signed)
[facility] Psychosocial Distress Screening Counseling Intern Note  Clinical Social Work was referred by distress screening protocol.  The patient scored a 8 on the Psychosocial Distress Thermometer which indicates Severe distress. Counseling Intern Vaughan Sine to assess for distress and other psychosocial needs.   ONCBCN DISTRESS SCREENING 07/22/2015  Screening Type Initial Screening  Distress experienced in past week (1-10) 8  Emotional problem type Nervousness/Anxiety  Physical Problem type Pain;Sleep/insomnia;Tingling hands/feet;Skin dry/itchy;Swollen arms/legs  Referral to support programs Yes   Counseling Intern Note: Met with Pt and daughter [name] during [facility]. Pr reported that her distress had decreased from an 8 to a 4 by the time of our encounter due to the gaining of information during her clinic. Pt identified additional  stressors related to recent back surgery and other physical problems that she has had previous to her Dx. Pt endorsed a strong support system in family and friends and expressed appreciating for the warmth and caring of her tx team. Counseling Intern provided information about supportive services available int he family support center. Pt and daughter expressed interest in support groups and several support center programs.  Pt stated that she would reach out as needed. FU: No specific follow up indicated at this time.   Vaughan Sine Counseling Inter [phone]

## 2015-07-22 NOTE — Patient Instructions (Signed)

## 2015-07-22 NOTE — Telephone Encounter (Signed)
Please refill her Qvar. Also confirm that she is taking Gabapentin 300 mg twice daily and not all 600mg  at bedtime.  I am ok prescribing Tramadol for her leg pain. Which pharmacy? Please send in Tramadol 50 mg, take 1 tab every 6 hours as needed for pain. 1 refill.

## 2015-07-22 NOTE — Therapy (Signed)
[place], [place], [address] Phone: [phone]   Fax:  [phone]  Physical Therapy Evaluation  Patient Details  Name: Brandi Stephens MRN: [id number] Date of Birth: 1945-10-08 Referring Provider: Dr. Stark Klein  Encounter Date: 07/22/2015      PT End of Session - 07/22/15 1703    Visit Number 1   Number of Visits 1   PT Start Time 1505   PT Stop Time 1538   PT Time Calculation (min) 33 min   Activity Tolerance Patient tolerated treatment well   Behavior During Therapy [facility] for tasks assessed/performed      Past Medical History  Diagnosis Date  . Hypertension   . Arthritis   . Hemorrhoids   . PONV (postoperative nausea and vomiting)   . Hypothyroidism   . Breast cancer of lower-outer quadrant of right female breast ([place]) 07/14/2015  . COPD (chronic obstructive pulmonary disease) ([place])   . Anxiety     Past Surgical History  Procedure Laterality Date  . Basal cell carcinoma excision  10/15    nose  . Abdominal hysterectomy  1985  . Breast reduction surgery  1998  . Cervical spine surgery  1995    hallo  . Knee arthroscopy Right 2002  . Colectomy  2005    right side  . Hemorrhoid surgery  2008  . Tonsillectomy    . Tubal ligation    . Hand surgery Left   . Eye surgery Left 07/2014    cataract removed 3 months later    There were no vitals filed for this visit.  Visit Diagnosis:  Abnormal posture - Plan: PT plan of care cert/re-cert  Breast cancer, female, left - Plan: PT plan of care cert/re-cert      Subjective Assessment - 07/22/15 1644    Subjective Patient reports she is here today for a baseline assessment of her newly diagnosed left breast cancer.   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  She has a history of bilateral breast reductions. She also has chronic back pain.  Her recent back surgery in 12/16 was extensive and has resulted in reduced function.  She is unable to walk > 5 minutes without stopping.  She completed home health PT but has not tried outpatient PT.   How long can you stand comfortably? 5 minutes   How long can you walk comfortably? 5 minutes   Patient Stated Goals Reduce lymphedema risk, learn post op shoudler ROM HEP, walk 30 minutes to reduce cancer recurrence risk and stand 15 minutes to perform self care tasks.   Currently in Pain? Yes   Pain Score 4    Pain Location Back   Pain Orientation Lower   Pain Descriptors / Indicators Aching   Pain Type Chronic pain   Pain Radiating Towards left leg and foot   Pain Onset More than a month ago   Pain Frequency Constant   Aggravating Factors  Walking and standing   Pain Relieving Factors Sitting and laying down   Effect of Pain on Daily Activities Unable to participate in a walking program   Multiple Pain Sites No            [facility] PT Assessment - 07/22/15 0001    Assessment   Medical Diagnosis Left breast  cancer   Referring Provider Dr. Stark Klein   Onset Date/Surgical Date 07/02/15   Hand Dominance Right   Prior Therapy Home health PT for back pain   Precautions   Precautions Other (comment)   Precaution Comments Active breast cancer   Restrictions   Weight Bearing Restrictions No   Balance Screen   Has the patient fallen in the past 6 months No   Has the patient had a decrease in activity level because of a fear of falling?  No   Is the patient reluctant to leave their home because of a fear of falling?  No   Home Environment   Living Environment Private residence   Living Arrangements Spouse/significant other   Available Help at Discharge Family   Prior Function   Level of Independence Independent with basic ADLs;Independent with gait;Independent with transfers   Vocation Retired    Leisure She is unable to walk for exercise due to back pain   Cognition   Overall Cognitive Status Within Functional Limits for tasks assessed   Posture/Postural Control   Posture/Postural Control Postural limitations   Postural Limitations Rounded Shoulders;Forward head;Decreased lumbar lordosis   ROM / Strength   AROM / PROM / Strength AROM;Strength   AROM   AROM Assessment Site Shoulder;Cervical   Right/Left Shoulder Right;Left   Right Shoulder Extension 37 Degrees   Right Shoulder Flexion 133 Degrees   Right Shoulder ABduction 143 Degrees   Right Shoulder Internal Rotation 63 Degrees   Right Shoulder External Rotation 79 Degrees   Left Shoulder Extension 57 Degrees   Left Shoulder Flexion 142 Degrees   Left Shoulder ABduction 148 Degrees   Left Shoulder Internal Rotation 78 Degrees   Left Shoulder External Rotation 65 Degrees   Strength   Overall Strength Within functional limits for tasks performed   Overall Strength Comments Lower extremities not assessed           LYMPHEDEMA/ONCOLOGY QUESTIONNAIRE - 07/22/15 1701    Type   Cancer Type Left breast cancer   Lymphedema Assessments   Lymphedema Assessments Upper extremities   Right Upper Extremity Lymphedema   10 cm Proximal to Olecranon Process 33.9 cm   Olecranon Process 27.6 cm   10 cm Proximal to Ulnar Styloid Process 25.6 cm   Just Proximal to Ulnar Styloid Process 17.5 cm   Across Hand at [employer] 20.5 cm   At [place] of 2nd Digit 7.1 cm   Left Upper Extremity Lymphedema   10 cm Proximal to Olecranon Process 35 cm   Olecranon Process 28.2 cm   10 cm Proximal to Ulnar Styloid Process 25.2 cm   Just Proximal to Ulnar Styloid Process 17.7 cm   Across Hand at [employer] 20.5 cm   At [place] of 2nd Digit 6.9 cm       Patient was instructed today in a home exercise program today for post op shoulder range of motion. These included active assist shoulder flexion in sitting, scapular retraction, wall walking  with shoulder abduction, and hands behind head external rotation.  She was encouraged to do these twice a day, holding 3 seconds and repeating 5 times when permitted by her physician.            PT Education - 07/22/15 1702    Education provided Yes   Education Details Lymphedema risk reduction and post op shoulder ROM HEP   Person(s) Educated Patient;Child(ren)   Methods Explanation;Demonstration;Handout   Comprehension Verbalized understanding;Returned demonstration  Breast Clinic Goals - 2015/08/12 1710    Patient will be able to verbalize understanding of pertinent lymphedema risk reduction practices relevant to her diagnosis specifically related to skin care.   Time 1   Period Days   Status Achieved   Patient will be able to return demonstrate and/or verbalize understanding of the post-op home exercise program related to regaining shoulder range of motion.   Time 1   Period Days   Status Achieved   Patient will be able to verbalize understanding of the importance of attending the postoperative After Breast Cancer Class for further lymphedema risk reduction education and therapeutic exercise.   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 She is planning to have an MRI and then surgery will be determined from that.  She will likely undergo a left lumpectomy and sentinel node biopsy followed by radiation and anti-estrogen therapy.  She will also have Oncotype testing.  She may benefit from post op PT to regain shoulder ROM and reduce lympehdema risk.   Pt will benefit from skilled therapeutic intervention in order to improve on the following deficits Decreased strength;Decreased knowledge of precautions;Pain;Impaired UE functional  use;Decreased range of motion   Rehab Potential Good   Clinical Impairments Affecting Rehab Potential Back pain   PT Frequency One time visit   PT Treatment/Interventions Therapeutic exercise;Patient/family education   PT Next Visit Plan Will f/u with cancer rehab after surgery   PT Home Exercise Plan Shoulder ROM post op HEP   Recommended Other Services PT for back pain   Consulted and Agree with Plan of Care Patient;Family member/caregiver   Family Member Consulted Daughter     Patient will follow up at outpatient cancer rehab if needed following surgery.  If the patient requires physical therapy at that time, a specific plan will be dictated and sent to the referring physician for approval. The patient was educated today on appropriate basic range of motion exercises to begin post operatively and the importance of attending the After Breast Cancer class following surgery.  Patient was educated today on lymphedema risk reduction practices as it pertains to recommendations that will benefit the patient immediately following surgery.  She verbalized good understanding.  No additional physical therapy is indicated at this time.         G-Codes - 08/12/15 1710    Functional Assessment Tool Used clinical judgement   Functional Limitation Other PT primary   Other PT Primary Current Status (Y0998) At least 1 percent but less than 20 percent impaired, limited or restricted   Other PT Primary Goal Status (P3825) At least 1 percent but less than 20 percent impaired, limited or restricted   Other PT Primary Discharge Status (K5397) At least 1 percent but less than 20 percent impaired, limited or restricted       Problem List Patient Active Problem List   Diagnosis Date Noted  . Breast cancer of upper-inner quadrant of left female breast ([place]) 07/14/2015  . Prediabetes 07/08/2015  . Chronic kidney disease 07/08/2015  . Dependent edema 06/08/2015  . Sleep disturbance 06/08/2015  .  Hyperlipidemia 06/08/2015  . Generalized anxiety disorder 06/08/2015  . Essential hypertension 06/08/2015  . Chronic obstructive  pulmonary disease ([place]) 06/08/2015  . Gout 06/08/2015  . Acute delirium 05/17/2015  . HCAP (healthcare-associated pneumonia)   . Macrocytosis   . 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 Altered mental status   . Sepsis ([place]) 05/15/2015  . Lumbar spondylosis 03/20/2015  . Paresthesias 04/01/2013  . Tremor 03/19/2013   Annia Friendly, PT 07/22/2015 5:12 PM  [place], [place], [address] Phone: [phone]   Fax:  [phone]  Name: Brandi Stephens MRN: [id number] Date of Birth: 24-Sep-1945

## 2015-07-22 NOTE — Progress Notes (Signed)
Brandi Stephens  [phone] Fax:([phone]     ID: Brandi Stephens DOB: [birth year]  MR#: [id number]  NTI#:[id number]  Patient Care Team: Girtha Rm, NP as PCP - General (Family Medicine) Chauncey Cruel, MD as Consulting Physician (Oncology) Stark Klein, MD as Consulting Physician (General Surgery) Kyung Rudd, MD as Consulting Physician (Radiation Oncology) [name], MD as Consulting Physician (Family Medicine) [name], MD as Consulting Physician (Neurosurgery) Griselda Miner, MD as Consulting Physician (Dermatology) PCP: Harland Dingwall, NP GYN: OTHER MD:  CHIEF COMPLAINT: Estrogen receptor positive breast cancer  CURRENT TREATMENT: Awaiting definitive surgery   BREAST CANCER HISTORY: Brandi Stephens started to feel some right breast pain and soreness in March 2017. She brought it to Dr. Lanice Shirts nurse practitioner, Brandi Stephens and since attention, and she was set up for bilateral diagnostic mammography with tomography at the [facility] 07/02/2015 area did the breast density was category C. The patient is status post bilateral reduction mammoplasties. Mammography showed no suspicious masses or calcifications in the right breast, and ultrasonography of the area where she has discomfort shows an area of distortion in the right breast at the 8:00 position 12 cm from the nipple measuring 1.4 cm thought to represent scarring related to the prior reduction surgery, but warranting further evaluation.  Also, in the left breast there was an area of distortion in the upper inner quadrant noted mammographically. Targeted ultrasound evaluating the left breast found no sonographic correlate to the mammographic findings. Ultrasound evaluation of both axillae were benign.  On 07/13/2015 Brandi Stephens underwent biopsy of the right breast area in question which showed only fibrocystic changes with adenosis. Biopsy of the left breast upper inner quadrant however showed  invasive ductal carcinoma, grade 1, estrogen receptor 90% positive, progesterone receptor 70% positive, WITH strong staining intensity, with an MIB-1 of 3%, and no HER-2 amplification, the signals ratio being 0.97 and the number per cell 1.65.  The patient's subsequent history is as detailed below  INTERVAL HISTORY: Brandi Stephens  was evaluated in the multidisciplinary breast cancer clinic 07/22/2015 accompanied by her daughter Brandi Stephens. Her case was also presented in the multidisciplinary breast cancer conference that same morning. At that time a preliminary plan was proposed: MRI of the breasts to further evaluate the ambiguous breast findings described above, Oncotype DX, and adjuvant radiation after surgery. This would be followed by anti-estrogens.   REVIEW OF SYSTEMS: THE PATIENT HAS SIGNIFICANT PAIN PROBLEMS RELATING TO BACK SURGERIES. SHE DESCRIBES HERSELF AS SIGNIFICANTLY FATIGUED, AND THIS AFFECTS HER DAILY ACTIVITIES. SHE SLEEPS POORLY. SHE HURTS IN HER LOWER BACK ON THE LEFT LEG AND LEFT FOOT BUT ALSO IN OTHER AREAS WHERE SHE HAS ARTHRITIS. SHE FEELS SHORT OF BREATH PARTICULARLY WHEN WALKING UPSTAIRS. SHE IS VERY DECONDITIONED. SHE HASN'T COUGH WHICH IS OCCASIONALLY PRODUCTIVE. OFTEN SHE FEELS NAUSEATED. SHE HAD A BASAL CELL SKIN CANCER REMOVED FROM HER NOSE. SHE HAS DIFFICULTY WALKING. SHE FEELS WEAK AND SHE HAS SIGNIFICANT PERIPHERAL NEUROPATHY. SHE FEELS ANXIOUS BUT DENIES DEPRESSION. A DETAILED REVIEW OF SYSTEMS WAS OTHERWISE STABLE   PAST MEDICAL HISTORY: Past Medical History  Diagnosis Date  . Hypertension   . Arthritis   . Hemorrhoids   . PONV (postoperative nausea and vomiting)   . Hypothyroidism   . Breast cancer of lower-outer quadrant of right female breast ([place]) 07/14/2015  . COPD (chronic obstructive pulmonary disease) ([place])   . Anxiety     PAST SURGICAL HISTORY: Past Surgical History  Procedure Laterality Date  . Basal cell carcinoma  excision  10/15    nose  . Abdominal  hysterectomy  1985  . Breast reduction surgery  1998  . Cervical spine surgery  1995    hallo  . Knee arthroscopy Right 2002  . Colectomy  2005    right side  . Hemorrhoid surgery  2008  . Tonsillectomy    . Tubal ligation    . Hand surgery Left   . Eye surgery Left 07/2014    cataract removed 3 months later    FAMILY HISTORY Family History  Problem Relation Age of Onset  . Breast cancer Maternal Aunt   . Lung cancer Maternal Uncle   . Stomach cancer Maternal Grandmother   . Lung cancer Maternal Aunt   . Brain cancer Maternal Aunt   The patient's father died at the age of 70 and her mother at the age of 42. The patient had no brothers, 2 sisters. The patient's mother was diagnosed with uterine cancer at the age of 68. The patient's sister was diagnosed with labial cancer at the age of 39. There is a maternal aunt with a history of breast cancer but the patient does not know at what age she was diagnosed. There is also on the mother's side history of lung cancer brain cancer and stomach cancer.   GYNECOLOGIC HISTORY:  No LMP recorded. Patient has had a hysterectomy.  Menarche age 10 first live birth age 75, the patient is [place] P3. She underwent hysterectomy without salpingo-oophorectomy at age 26. She took hormone replacement only for a few months.   SOCIAL HISTORY:   the patient is a retired Architect. Her husband on is also retired. He used to work for [employer]. Daughter Brandi Stephens lives in [place] and she is a Wellsite geologist, currently not employed. Daughter Brandi Stephens lives in [place] and has a Brewing technologist in social work but is not currently working. Son Brandi Stephens died at age 54 from causes not clear after autopsy. He had had significant neurologic damage following an accident     ADVANCED DIRECTIVES:  not in place    HEALTH MAINTENANCE: Social History  Substance Use Topics  . Smoking status: Former Smoker -- 1.00 packs/day for 35 years    Types: Cigarettes    Quit  date: 03/15/2010  . Smokeless tobacco: None  . Alcohol Use: 0.6 oz/week    1 Glasses of wine per week     Colonoscopy: 2015/[facility]   PAP:  Bone density: 2016  Lipid panel:  Allergies  Allergen Reactions  . Morphine And Related Itching  . Penicillins Other (See Comments)    Childhood reaction. Tolerated ampicillin in Jan 2017 Has patient had a PCN reaction causing immediate rash, facial/tongue/throat swelling, SOB or lightheadedness with hypotension: No Has patient had a PCN reaction causing severe rash involving mucus membranes or skin necrosis: No Has patient had a PCN reaction that required hospitalization No Has patient had a PCN reaction occurring within the last 10 years: No If all of the above answers are "NO", then may proceed with Cephalosporin use.     Current Outpatient Prescriptions  Medication Sig Dispense Refill  . allopurinol (ZYLOPRIM) 100 MG tablet Take 1 tablet by mouth daily.    Marland Kitchen atorvastatin (LIPITOR) 20 MG tablet Take 1 tablet (20 mg total) by mouth daily. 90 tablet 1  . furosemide (LASIX) 20 MG tablet Take 20 mg by mouth. Reported on 06/08/2015    . gabapentin (NEURONTIN) 300 MG capsule Take 1 tablet morning and afternoon and  2 tablets at bedtime 90 capsule 3  . KLOR-CON 10 10 MEQ tablet Take 10 mEq by mouth as needed.    Marland Kitchen levothyroxine (SYNTHROID, LEVOTHROID) 75 MCG tablet Take 75 mcg by mouth daily.    Marland Kitchen MAGNESIUM CITRATE PO Take 2 tablets by mouth at bedtime. Reported on 06/08/2015    . tiotropium (SPIRIVA) 18 MCG inhalation capsule Place 1 capsule (18 mcg total) into inhaler and inhale daily. 90 capsule 3  . valsartan-hydrochlorothiazide (DIOVAN-HCT) 80-12.5 MG tablet Take 1 tablet by mouth daily.    . beclomethasone (QVAR) 80 MCG/ACT inhaler Inhale 2 puffs into the lungs 2 (two) times daily. 1 Inhaler 0  . traMADol (ULTRAM) 50 MG tablet Take 1 tablet (50 mg total) by mouth every 6 (six) hours as needed. Reported on 06/08/2015 30 tablet 0   No current  facility-administered medications for this visit.    OBJECTIVE: Middle-aged white woman who appears stated age 62 Vitals:   07/22/15 1246  BP: 124/85  Pulse: 89  Temp: 97.9 F (36.6 C)  Resp: 17     Body mass index is 36.67 kg/(m^2).    ECOG FS:2 - Symptomatic, <50% confined to bed  Ocular: Sclerae unicteric, pupils equal, round and reactive to light Ear-nose-throat: Oropharynx clear and moist Lymphatic: No cervical or supraclavicular adenopathy Lungs no rales or rhonchi, good excursion bilaterally Heart regular rate and rhythm, no murmur appreciated Abd soft,  obese, nontender, positive bowel sounds mild lumbar spinal tenderness, no joint edema Neuro: non-focal, well-oriented, appropriate affect Breasts: both breasts are status post reduction mammoplasty. Both are status post recent biopsy. I do not palpate a mass in either breast. There are no skin or nipple changes of concern. Both axillae are benign.    LAB RESULTS:  CMP     Component Value Date/Time   NA 140 07/22/2015 1227   NA 139 07/07/2015 0001   K 5.1 07/22/2015 1227   K 5.1 07/07/2015 0001   CL 101 07/07/2015 0001   CO2 26 07/22/2015 1227   CO2 23 07/07/2015 0001   GLUCOSE 100 07/22/2015 1227   GLUCOSE 114* 07/07/2015 0001   BUN 25.0 07/22/2015 1227   BUN 38* 07/07/2015 0001   CREATININE 1.3* 07/22/2015 1227   CREATININE 1.14* 07/07/2015 0001   CREATININE 0.95 05/20/2015 0820   CALCIUM 9.8 07/22/2015 1227   CALCIUM 9.4 07/07/2015 0001   PROT 7.1 07/22/2015 1227   PROT 6.0* 07/07/2015 0001   ALBUMIN 3.6 07/22/2015 1227   ALBUMIN 3.7 07/07/2015 0001   AST 13 07/22/2015 1227   AST 13 07/07/2015 0001   ALT 13 07/22/2015 1227   ALT 12 07/07/2015 0001   ALKPHOS 83 07/22/2015 1227   ALKPHOS 78 07/07/2015 0001   BILITOT 0.30 07/22/2015 1227   BILITOT 0.4 07/07/2015 0001   GFRNONAA >60 05/20/2015 0820   GFRAA >60 05/20/2015 0820    INo results found for: SPEP, UPEP  Lab Results  Component Value  Date   WBC 10.4* 07/22/2015   NEUTROABS 7.0* 07/22/2015   HGB 14.4 07/22/2015   HCT 44.1 07/22/2015   MCV 93.0 07/22/2015   PLT 283 07/22/2015      Chemistry      Component Value Date/Time   NA 140 07/22/2015 1227   NA 139 07/07/2015 0001   K 5.1 07/22/2015 1227   K 5.1 07/07/2015 0001   CL 101 07/07/2015 0001   CO2 26 07/22/2015 1227   CO2 23 07/07/2015 0001   BUN 25.0 07/22/2015 1227  BUN 38* 07/07/2015 0001   CREATININE 1.3* 07/22/2015 1227   CREATININE 1.14* 07/07/2015 0001   CREATININE 0.95 05/20/2015 0820      Component Value Date/Time   CALCIUM 9.8 07/22/2015 1227   CALCIUM 9.4 07/07/2015 0001   ALKPHOS 83 07/22/2015 1227   ALKPHOS 78 07/07/2015 0001   AST 13 07/22/2015 1227   AST 13 07/07/2015 0001   ALT 13 07/22/2015 1227   ALT 12 07/07/2015 0001   BILITOT 0.30 07/22/2015 1227   BILITOT 0.4 07/07/2015 0001       No results found for: LABCA2  No components found for: LABCA125  No results for input(s): INR in the last 168 hours.  Urinalysis    Component Value Date/Time   COLORURINE YELLOW 05/15/2015 [place] 05/15/2015 1218   LABSPEC 1.012 05/15/2015 1218   PHURINE 5.0 05/15/2015 [place] 05/15/2015 [place] 05/15/2015 [place] 05/15/2015 [place] 05/15/2015 1218   PROTEINUR NEGATIVE 05/15/2015 1218   NITRITE NEGATIVE 05/15/2015 1218   LEUKOCYTESUR NEGATIVE 05/15/2015 1218      ELIGIBLE FOR AVAILABLE RESEARCH PROTOCOL: no  STUDIES: US Breast Ltd Uni Left Inc Axilla  07/02/2015  CLINICAL DATA:  70 year old female with focal right breast pain and lump for 1 month. The patient has a history of bilateral breast reductions in 1996. The patient also has a history of a left excisional biopsy in 1972. The patient states that the scar from the excisional biopsy was beneath her left nipple but that this scar was removed at the time of her breast reductions. EXAM: DIGITAL  DIAGNOSTIC BILATERAL MAMMOGRAM WITH 3D TOMOSYNTHESIS WITH CAD ULTRASOUND BILATERAL BREAST COMPARISON:  Previous exam(s). ACR Breast Density Category c: The breast tissue is heterogeneously dense, which may obscure small masses. FINDINGS: Postreduction changes are noted bilaterally. No suspicious mass, calcifications, or other abnormality is identified within the right breast. There is an area of distortion within the upper, inner left breast, middle depth. Mammographic images were processed with CAD. On physical exam, no discrete mass is felt in the patient's area of concern in the lower, outer right breast. Targeted ultrasound of the right breast was performed in the area of patient's focal pain demonstrating an area of distortion within the right breast at 8 o'clock, 12 cm from the nipple measuring approximately 14 x 12 mm in the antiradial plane. This area was only well seen in the anti radial plane and is thought to most likely represent scarring related to patient's prior reduction surgery. Targeted ultrasound of the left breast was performed demonstrating no definite correlate for the area of distortion seen mammographically. Ultrasound evaluation of both axilla demonstrates no suspicious axillary lymph nodes. IMPRESSION: 1. Indeterminate left breast distortion. 2. Area of right breast distortion seen sonographically in the area of patient's focal pain. While this is favored to represent scarring related to patient's prior reduction surgery, biopsy is recommended to exclude malignancy. RECOMMENDATION: 1. Affirm guided biopsy of the left breast distortion. 2. Ultrasound-guided biopsy of the area of distortion within the right breast at 8 o'clock, 12 cm from the nipple. I have discussed the findings and recommendations with the patient. Results were also provided in writing at the conclusion of the visit. If applicable, a reminder letter will be sent to the patient regarding the next appointment. BI-RADS  CATEGORY  4: Suspicious. Electronically Signed   By: Pamelia Hoit M.D.   On: 07/02/2015 17:31  US Breast Ltd Uni Right Inc Axilla  07/02/2015  CLINICAL DATA:  70 year old female with focal right breast pain and lump for 1 month. The patient has a history of bilateral breast reductions in 1996. The patient also has a history of a left excisional biopsy in 1972. The patient states that the scar from the excisional biopsy was beneath her left nipple but that this scar was removed at the time of her breast reductions. EXAM: DIGITAL DIAGNOSTIC BILATERAL MAMMOGRAM WITH 3D TOMOSYNTHESIS WITH CAD ULTRASOUND BILATERAL BREAST COMPARISON:  Previous exam(s). ACR Breast Density Category c: The breast tissue is heterogeneously dense, which may obscure small masses. FINDINGS: Postreduction changes are noted bilaterally. No suspicious mass, calcifications, or other abnormality is identified within the right breast. There is an area of distortion within the upper, inner left breast, middle depth. Mammographic images were processed with CAD. On physical exam, no discrete mass is felt in the patient's area of concern in the lower, outer right breast. Targeted ultrasound of the right breast was performed in the area of patient's focal pain demonstrating an area of distortion within the right breast at 8 o'clock, 12 cm from the nipple measuring approximately 14 x 12 mm in the antiradial plane. This area was only well seen in the anti radial plane and is thought to most likely represent scarring related to patient's prior reduction surgery. Targeted ultrasound of the left breast was performed demonstrating no definite correlate for the area of distortion seen mammographically. Ultrasound evaluation of both axilla demonstrates no suspicious axillary lymph nodes. IMPRESSION: 1. Indeterminate left breast distortion. 2. Area of right breast distortion seen sonographically in the area of patient's focal pain. While this is favored to  represent scarring related to patient's prior reduction surgery, biopsy is recommended to exclude malignancy. RECOMMENDATION: 1. Affirm guided biopsy of the left breast distortion. 2. Ultrasound-guided biopsy of the area of distortion within the right breast at 8 o'clock, 12 cm from the nipple. I have discussed the findings and recommendations with the patient. Results were also provided in writing at the conclusion of the visit. If applicable, a reminder letter will be sent to the patient regarding the next appointment. BI-RADS CATEGORY  4: Suspicious. Electronically Signed   By: Pamelia Hoit M.D.   On: 07/02/2015 17:31   Mm Diag Breast Tomo Bilateral  07/13/2015  CLINICAL DATA:  70 year old female status post tomosynthesis guided left breast biopsy and ultrasound-guided right breast biopsy EXAM: 3D DIAGNOSTIC BILATERAL MAMMOGRAM POST STEREOTACTIC BIOPSY OF THE LEFT BREAST AND ULTRASOUND BIOPSY OF THE RIGHT BREAST COMPARISON:  Previous exam(s). FINDINGS: 3D Mammographic images were obtained of the left breast following tomosynthesis guided biopsy of a left breast distortion. Post biopsy images demonstrate the coil shaped biopsy marker to be approximately 8 mm medial to the expected location of the distortion within the upper, inner left breast. 2D mammographic images were obtained of the right breast following ultrasound-guided biopsy. Post biopsy images demonstrate the heart shaped biopsy marker in the expected location within the slightly lower, outer right breast. IMPRESSION: Bilateral marker placement as above. Final Assessment: Post Procedure Mammograms for Marker Placement Electronically Signed   By: Pamelia Hoit M.D.   On: 07/13/2015 11:45   Mm Diag Breast Tomo Bilateral  07/02/2015  CLINICAL DATA:  70 year old female with focal right breast pain and lump for 1 month. The patient has a history of bilateral breast reductions in 1996. The patient also has a history of a left excisional biopsy in 1972. The  patient states that the scar from the excisional biopsy was beneath her left nipple but that this scar was removed at the time of her breast reductions. EXAM: DIGITAL DIAGNOSTIC BILATERAL MAMMOGRAM WITH 3D TOMOSYNTHESIS WITH CAD ULTRASOUND BILATERAL BREAST COMPARISON:  Previous exam(s). ACR Breast Density Category c: The breast tissue is heterogeneously dense, which may obscure small masses. FINDINGS: Postreduction changes are noted bilaterally. No suspicious mass, calcifications, or other abnormality is identified within the right breast. There is an area of distortion within the upper, inner left breast, middle depth. Mammographic images were processed with CAD. On physical exam, no discrete mass is felt in the patient's area of concern in the lower, outer right breast. Targeted ultrasound of the right breast was performed in the area of patient's focal pain demonstrating an area of distortion within the right breast at 8 o'clock, 12 cm from the nipple measuring approximately 14 x 12 mm in the antiradial plane. This area was only well seen in the anti radial plane and is thought to most likely represent scarring related to patient's prior reduction surgery. Targeted ultrasound of the left breast was performed demonstrating no definite correlate for the area of distortion seen mammographically. Ultrasound evaluation of both axilla demonstrates no suspicious axillary lymph nodes. IMPRESSION: 1. Indeterminate left breast distortion. 2. Area of right breast distortion seen sonographically in the area of patient's focal pain. While this is favored to represent scarring related to patient's prior reduction surgery, biopsy is recommended to exclude malignancy. RECOMMENDATION: 1. Affirm guided biopsy of the left breast distortion. 2. Ultrasound-guided biopsy of the area of distortion within the right breast at 8 o'clock, 12 cm from the nipple. I have discussed the findings and recommendations with the patient. Results  were also provided in writing at the conclusion of the visit. If applicable, a reminder letter will be sent to the patient regarding the next appointment. BI-RADS CATEGORY  4: Suspicious. Electronically Signed   By: Pamelia Hoit M.D.   On: 07/02/2015 17:31   Mm Lt Breast Bx W Loc Dev 1st Lesion Image Bx Spec Stereo Guide  07/15/2015  ADDENDUM REPORT: 07/15/2015 08:20 ADDENDUM: Pathology revealed grade I invasive ductal carcinoma, atypical lobular hyperplasia, and fibrocystic changes with adenosis and calcifications in the left breast. The right breast showed fibrocystic changes with adenosis and calcifications. This was found to be concordant by Dr. Pamelia Hoit. Pathology results were discussed with the patient by telephone. The patient reported doing well after the biopsy. Post biopsy instructions and care were reviewed and questions were answered. The patient was encouraged to call [facility] for any additional concerns. The patient was referred to the [facility] at the [facility] on July 22, 2015. Pathology results reported by Susa Raring RN, BSN on 07/15/2015. Electronically Signed   By: Pamelia Hoit M.D.   On: 07/15/2015 08:20  07/15/2015  CLINICAL DATA:  70 year old female for tomosynthesis guided biopsy of a left breast distortion EXAM: LEFT BREAST STEREOTACTIC CORE NEEDLE BIOPSY COMPARISON:  Previous exams. FINDINGS: The patient and I discussed the procedure of stereotactic/tomosynthesis -guided biopsy including benefits and alternatives. We discussed the high likelihood of a successful procedure. We discussed the risks of the procedure including infection, bleeding, tissue injury, clip migration, and inadequate sampling. Informed written consent was given. The usual time out protocol was performed immediately prior to the procedure. Using sterile technique and 1% Lidocaine as local anesthetic, under stereotactic guidance, a 9 gauge  vacuum assist device was used to perform core needle biopsy of a distortion within the upper, inner left breast using a superior to inferior approach. At the conclusion of the procedure, a coil shaped tissue marker clip was deployed into the biopsy cavity. Follow-up 2-view mammogram was performed and dictated separately. IMPRESSION: Stereotactic/ tomosynthesis -guided biopsy of a left breast distortion. No apparent complications. Electronically Signed: By: Pamelia Hoit M.D. On: 07/13/2015 11:21   [place] Rt Breast Bx W Loc Dev 1st Lesion Img Bx Spec US Guide  07/15/2015  ADDENDUM REPORT: 07/15/2015 08:20 ADDENDUM: Pathology revealed grade I invasive ductal carcinoma, atypical lobular hyperplasia, and fibrocystic changes with adenosis and calcifications in the left breast. The right breast showed fibrocystic changes with adenosis and calcifications. This was found to be concordant by Dr. Pamelia Hoit. Pathology results were discussed with the patient by telephone. The patient reported doing well after the biopsy. Post biopsy instructions and care were reviewed and questions were answered. The patient was encouraged to call [facility] for any additional concerns. The patient was referred to the [facility] at the [facility] on July 22, 2015. Pathology results reported by Susa Raring RN, BSN on 07/15/2015. Electronically Signed   By: Pamelia Hoit M.D.   On: 07/15/2015 08:20  07/15/2015  CLINICAL DATA:  70 year old female for ultrasound-guided biopsy of a possible subtle area of distortion within the right breast at 8 o'clock, 12 cm from the nipple EXAM: ULTRASOUND GUIDED RIGHT BREAST CORE NEEDLE BIOPSY COMPARISON:  Previous exam(s). PROCEDURE: I met with the patient and we discussed the procedure of ultrasound-guided biopsy, including benefits and alternatives. We discussed the high likelihood of a successful procedure. We discussed the risks of the  procedure including infection, bleeding, tissue injury, clip migration, and inadequate sampling. Informed written consent was given. The usual time-out protocol was performed immediately prior to the procedure. Using sterile technique and 1% Lidocaine as local anesthetic, under direct ultrasound visualization, a 12 gauge vacuum-assisted device was used to perform biopsy of a possible subtle area of distortion within the right breast at 8 o'clock, 12 cm from the nipple using a lateral to medial approach. At the conclusion of the procedure, a heart shaped tissue marker clip was deployed into the biopsy cavity. Follow-up 2-view mammogram was performed and dictated separately. IMPRESSION: Ultrasound-guided biopsy of the right breast. No apparent complications. Electronically Signed: By: Pamelia Hoit M.D. On: 07/13/2015 11:44    ASSESSMENT: 70 y.o. [place] woman  Status post left breast biopsy 07/13/2015 for a clinical [place] N0 invasive ductal carcinoma, grade 1, estrogen and progesterone receptor positive, HER-2 nonamplified, with an MIB-1 of 3%.  (1) breast conserving surgery planned  (2) Oncotype to be obtained if the tumor proves to be larger than 5 mm.  (3) Adjuvant radiation to follow  (4) anti-estrogens to follow completion of local treatment  PLAN: We spent the better part of today's hour-long appointment discussing the biology of breast cancer in general, and the specifics of the patient's tumor in particular. we discussed the results of her mammography and ultrasonography and she understands she will benefit from bilateral breast MRIs to better delineate abnormalities in either of her breasts.  As far as the left breast is concerned, the plan is for lumpectomy followed by radiation and she understands there is no survival advantage to mastectomy in this setting.  We discussed systemic treatment and she understands she is not a candidate for anti-HER-2  therapy. She is a very good candidate for  anti-estrogens, as slow-growing not aggressive appearing tumors like hers do very well on this type of oral treatment.  By contrast chemotherapy works best on rapidly growing aggressive tumors. In her case chemotherapy is likely to be of marginal if any benefit. If her tumor proves to be larger than 5 mm we will consider an Oncotype. It would be very surprising if this showed the tumor to be high risk. In short Anica understands we do not believe she is likely to need or benefit from chemotherapy but we will not know that for sure until at least 2 weeks after her definitive surgery, when I get the Oncotype results.  Accordingly I will see her around that time. She  has a good understanding of the overall plan. She agrees with it. She knows the goal of treatment in her case is cure. She will call with any problems that may develop before her next visit here.  Chauncey Cruel, MD   07/22/2015 7:44 PM Medical Oncology and Hematology [facility] [address] [place], [place] [address] Tel. [phone]    Fax. [phone]

## 2015-07-22 NOTE — Telephone Encounter (Signed)
Pt was advised to take 300mg  twice a day and i have called in meds to pharmacy

## 2015-07-23 ENCOUNTER — Telehealth: Payer: Self-pay | Admitting: Internal Medicine

## 2015-07-23 ENCOUNTER — Encounter: Payer: Self-pay | Admitting: *Deleted

## 2015-07-23 NOTE — Telephone Encounter (Signed)
Pt needed a referral to her eye doctor Dr. Valetta Close at [facility]  Dr. Valetta Close  NPI- [id number] Dx Code- Z96.1 [employer] ID- [id number]  Pt was Approved # [id number]

## 2015-07-24 ENCOUNTER — Telehealth: Payer: Self-pay | Admitting: Family Medicine

## 2015-07-24 MED ORDER — FLUTICASONE PROPIONATE HFA 110 MCG/ACT IN AERO: 2.0000 | INHALATION_SPRAY | Freq: Two times a day (BID) | RESPIRATORY_TRACT | Status: DC

## 2015-07-24 NOTE — Telephone Encounter (Signed)
Recv'd fax from [facility] that Qvar not covered and preferred medications are Flovent Diskus, Arnuity ellipta, Flovent HFA Do you want to switch?

## 2015-07-24 NOTE — Telephone Encounter (Signed)
We can switch her to Flovent HFA 110 mcg 2 puffs twice daily if this is covered.

## 2015-07-24 NOTE — Telephone Encounter (Signed)
Sent in flovent to [facility]

## 2015-07-27 ENCOUNTER — Telehealth: Payer: Self-pay | Admitting: *Deleted

## 2015-07-27 ENCOUNTER — Ambulatory Visit: Payer: Self-pay | Admitting: Family Medicine

## 2015-07-27 NOTE — Telephone Encounter (Signed)
Spoke with patient to follow up from [facility]. She is doing well just anxious to get started.  Encouraged her to call with any questions or concerns.

## 2015-07-28 ENCOUNTER — Ambulatory Visit (INDEPENDENT_AMBULATORY_CARE_PROVIDER_SITE_OTHER): Payer: Commercial Managed Care - HMO | Admitting: Psychiatry

## 2015-07-28 ENCOUNTER — Encounter: Payer: Self-pay | Admitting: Family Medicine

## 2015-07-28 DIAGNOSIS — F4323 Adjustment disorder with mixed anxiety and depressed mood: Secondary | ICD-10-CM

## 2015-07-29 ENCOUNTER — Ambulatory Visit
Admission: RE | Admit: 2015-07-29 | Discharge: 2015-07-29 | Disposition: A | Discharge status: Home / Self Care | Payer: Commercial Managed Care - HMO | Source: Ambulatory Visit | Attending: General Surgery | Admitting: General Surgery

## 2015-07-29 DIAGNOSIS — C50212 Malignant neoplasm of upper-inner quadrant of left female breast: Secondary | ICD-10-CM

## 2015-07-29 MED ORDER — GADOBENATE DIMEGLUMINE 529 MG/ML IV SOLN
10.0000 mL | Freq: Once | INTRAVENOUS | Status: AC | PRN
  Administered 2015-07-29: 10 mL via INTRAVENOUS

## 2015-07-30 ENCOUNTER — Ambulatory Visit: Payer: Commercial Managed Care - HMO | Admitting: Physical Therapy

## 2015-07-30 DIAGNOSIS — M5442 Lumbago with sciatica, left side: Secondary | ICD-10-CM

## 2015-07-30 DIAGNOSIS — M6281 Muscle weakness (generalized): Secondary | ICD-10-CM

## 2015-07-30 DIAGNOSIS — R293 Abnormal posture: Secondary | ICD-10-CM | POA: Diagnosis not present

## 2015-07-30 DIAGNOSIS — R2689 Other abnormalities of gait and mobility: Secondary | ICD-10-CM

## 2015-07-30 NOTE — Therapy (Signed)
[facility] [address] [place], [place], [address] Phone: [phone]   Fax:  [phone]  Physical Therapy Evaluation  Patient Details  Name: Brandi Stephens MRN: [id number] Date of Birth: [birth year] Referring Provider:  Dr. Redmond School ; Mack Hook NP  Encounter Date: 07/30/2015      PT End of Session - 07/30/15 1509    Visit Number 1   Number of Visits 10   Date for PT Re-Evaluation 27-Sep-2015   Authorization Type G codes; KX at visit 15   PT Start Time [id number]   PT Stop Time 1505   PT Time Calculation (min) 60 min   Activity Tolerance Patient tolerated treatment well      Past Medical History  Diagnosis Date  . Hypertension   . Arthritis   . Hemorrhoids   . PONV (postoperative nausea and vomiting)   . Hypothyroidism   . Breast cancer of lower-outer quadrant of right female breast ([place]) 07/14/2015  . COPD (chronic obstructive pulmonary disease) ([place])   . Anxiety     Past Surgical History  Procedure Laterality Date  . Basal cell carcinoma excision  10/15    nose  . Abdominal hysterectomy  1985  . Breast reduction surgery  1998  . Cervical spine surgery  1995    hallo  . Knee arthroscopy Right 2002  . Colectomy  2005    right side  . Hemorrhoid surgery  2008  . Tonsillectomy    . Tubal ligation    . Hand surgery Left   . Eye surgery Left 07/2014    cataract removed 3 months later    There were no vitals filed for this visit.       Subjective Assessment - 07/30/15 1404    Subjective History of numbness in arms and legs degenerative spinal disease; Last summer pain worsened;  Multple surgeries since 2015 retinal bleed, basal cell nose.  Homebound 2 years per daughter's report.   04/03/15 lumbar fusion.  Had HHPT for 3 weeks cut short secondary to edema and severe pain.   then 2 weeks later left LE "nerve pain."  Then B foot edema started.  End of Jan developed pneumonia and sepsis, hospitalized 6  days and then short term rehab for 11 days, did bike, a lot of walking with walker, stairs, standing balance.  Had another HHPT for 6 weeks for stairs, off walker.  I'm still in terrible pain.  Had MRI yesterday for breast CA, lying prone 45 min and then a repeat.  Getting up and down worsened.    Able to drive.   Patient is accompained by: Family member   Pertinent History Back surgery Dr. Stephannie Peters sees at 6 months post op   Limitations Sitting;Standing;Walking   How long can you sit comfortably? 30 min- 1hour   How long can you stand comfortably? 2 min   How long can you walk comfortably? 10 min without , longer with shopping cart.     Patient Stated Goals pain reduction;  how to get arms ready lymph node surgery lumpectomy (not scheduled yet)  and radiation surgery so needs to get arms overhead, increase endurance 30 min walking   Currently in Pain? Yes   Pain Score 4    Pain Location Back   Pain Orientation Left   Pain Type Chronic pain   Pain Radiating Towards left balls of feet numbness, lateral border of foot on left sensitive   Pain Onset More than  a month ago   Pain Frequency Constant   Aggravating Factors  standing, walking   Pain Relieving Factors sitting upright and lying down supine or sides            [facility] PT Assessment - 07/30/15 0001    Assessment   Medical Diagnosis LBP   Referring Provider  Dr. Redmond School ; Mack Hook NP   Onset Date/Surgical Date 03/20/15   Hand Dominance Right   Prior Therapy HHPT;  had outpatient PT prior to surgery unable to tolerate   Precautions   Precautions Other (comment)   Precaution Comments No U/S CA   Restrictions   Weight Bearing Restrictions No   Balance Screen   Has the patient fallen in the past 6 months No   Has the patient had a decrease in activity level because of a fear of falling?  No   Is the patient reluctant to leave their home because of a fear of falling?  No   Home Social worker Private  residence   Living Arrangements Spouse/significant other   Available Help at Discharge Family   Type of [place] to enter   Entrance Stairs-Number of Steps 3   [place] Two level;Able to live on main level with bedroom/bathroom   Alternate Level Stairs-Number of Steps [place] - 2 wheels   Prior Function   Level of Independence Independent with basic ADLs;Independent with gait;Independent with transfers   Vocation Retired   Leisure play with 75 year old grandchild   IT trainer store   Observation/Other Assessments   Focus on Therapeutic Outcomes (FOTO)  56% limitation   Posture/Postural Control   Postural Limitations Decreased lumbar lordosis   ROM / Strength   AROM / PROM / Strength AROM;Strength   AROM   AROM Assessment Site Lumbar   Lumbar Flexion 45   Lumbar Extension 10   Lumbar - Right Side Bend 35   Lumbar - Left Side Bend 30   Strength   Overall Strength Comments Able to rise from standard chair without UE use   Strength Assessment Site Lumbar;Hip;Knee   Right/Left Hip Right;Left   Right Hip Flexion 4/5   Right Hip ABduction 4-/5   Left Hip Flexion 4/5   Left Hip ABduction 4-/5   Lumbar Flexion 3+/5   Lumbar Extension 3+/5   Flexibility   Soft Tissue Assessment /Muscle Length yes   Hamstrings yes   Quadriceps yes   Palpation   Palpation comment minimal gluteal/piriformis tenderness   Special Tests    Special Tests Lumbar   Lumbar Tests Slump Test   Slump test   Findings Positive   Side Left   Ambulation/Gait   Ambulation/Gait Yes   Assistive device None   Gait Comments TUG WNLs 8.7 sec                           PT Education - 07/30/15 1509    Education provided Yes   Education Details supine abdominal brace; hand to knee isometric; sidelying clams   Person(s) Educated Patient;Child(ren)   Methods Explanation;Demonstration;Handout   Comprehension Verbalized understanding;Returned  demonstration          PT Short Term Goals - 07/30/15 1522    PT SHORT TERM GOAL #1   Title The patient will be able to perform initial HEP for early core muscle activation and strengthening   08/27/15  Time 4   Period Weeks   Status New   PT SHORT TERM GOAL #2   Title The patient will be able to stand for 5 min needed for grooming and light meal prep   Time 4   Period Weeks   Status New   PT SHORT TERM GOAL #3   Title The patient will be able to ambulate 600 feet in 6 min needed for short distance community ambulation   Time 4   Period Weeks   Status New   PT SHORT TERM GOAL #4   Title The patient will report an improvement in pain by 25% with usual ADLS   Time 4   Period Weeks   Status New           PT Long Term Goals - 07/30/15 1527    PT LONG TERM GOAL #1   Title The patient will be independent in safe, self progression of HEP for further gains in strength and function   09/24/15   Time 8   Period Weeks   Status New   PT LONG TERM GOAL #2   Title The patient will improved abdominal and trunk extensor strength to 4/5 needed for standing 8 min for grooming and light meal prep   Time 8   Period Weeks   Status New   PT LONG TERM GOAL #3   Title The patient will be able to ambulate 30 min needed for recovery from surgery and community ambulation   Time 8   Period Weeks   Status New   PT LONG TERM GOAL #4   Title The patient will have improved hip abduction strength to 4+/5 needed for greater ease with standing and walking longer periods of time.     Time 8   Period Weeks   Status New   PT LONG TERM GOAL #5   Title Overall back pain intensity improved by 50% with usual ADLS.     Time 8   Period Weeks   Status New   Additional Long Term Goals   Additional Long Term Goals Yes   PT LONG TERM GOAL #6   Title FOTO functional outcome score improved from 56% to 48% indicating improved function with less pain   Time 8   Period Weeks   Status New                Plan - 07/30/15 1510    Clinical Impression Statement The patient is of moderate complexity.  The patient is pleasant female with a long history of degenerative spine pain worsening last summer.  She underwent lumbar fusion surgery 03/20/15 with continued severe pain and left > right LE pain and numbness.  She developed pneumonia and sepsis requiring additional hospitalization and short term rehab stay.  Her recovery is further complicated by breast cancer and she will undergo surgery and radiation at an undetermined date. She was referred to this clinician by Cancer rehab specialist, Leone Payor PT for treatment of back pain which affects her ability to walk to aide her recovery.   Her pain is worsened with standing and walking.  Standing tolerance 2 min, walking distance without device 10 min.  She is better with sitting or lying. Decreased spinal AROM.  LE strength grossly 4/5 except hip abd 4-/5.  Decreased activation of transverse abdominals and trunk extensors 3+/5.  Decreased HS and psoas muscle lengths.  Positive left > right slump test.   She would benefit from PT to  address these deficits.     Rehab Potential Good   Clinical Impairments Affecting Rehab Potential Breast CA no U/S   PT Frequency 2x / week   PT Duration 8 weeks   PT Treatment/Interventions ADLs/Self Care Home Management;Cryotherapy;Electrical Stimulation;Moist Heat;Therapeutic exercise;Patient/family education;Neuromuscular re-education;Manual techniques;Taping   PT Next Visit Plan review abdominal brace in supine, progress to seated;  review clams, add band as tolerated;  gentle HS, psoas stretching;   Nu-Step;  6 min walk test      Patient will benefit from skilled therapeutic intervention in order to improve the following deficits and impairments:  Decreased activity tolerance, Decreased endurance, Decreased range of motion, Decreased strength, Difficulty walking, Pain  Visit Diagnosis: Bilateral low back  pain with left-sided sciatica - Plan: PT plan of care cert/re-cert  Muscle weakness (generalized) - Plan: PT plan of care cert/re-cert  Other abnormalities of gait and mobility - Plan: PT plan of care cert/re-cert      G-Codes - [id number]    Functional Assessment Tool Used FOTO; clinical judgement   Functional Limitation Mobility: Walking and moving around   Mobility: Walking and Moving Around Current Status [id number]) At least 40 percent but less than 60 percent impaired, limited or restricted   Mobility: Walking and Moving Around Goal Status [id number]) At least 40 percent but less than 60 percent impaired, limited or restricted       Problem List Patient Active Problem List   Diagnosis Date Noted  . Breast cancer of upper-inner quadrant of left female breast ([place]) 07/14/2015  . Prediabetes 07/08/2015  . Chronic kidney disease 07/08/2015  . Dependent edema 06/08/2015  . Sleep disturbance 06/08/2015  . Hyperlipidemia 06/08/2015  . Generalized anxiety disorder 06/08/2015  . Essential hypertension 06/08/2015  . Chronic obstructive pulmonary disease ([place]) 06/08/2015  . Gout 06/08/2015  . Acute delirium 05/17/2015  . HCAP (healthcare-associated pneumonia)   . Macrocytosis   . 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 Altered mental status   . Sepsis ([place]) 05/15/2015  . Lumbar spondylosis 03/20/2015  . Paresthesias 04/01/2013  . Tremor 03/19/2013     Ruben Im, PT 07/30/2015 3:48 PM Phone: [phone] Fax: [phone] Alvera Singh 07/30/2015, 3:47 PM  [facility] [address], [place], [place], [address] Phone: [phone]   Fax:  [phone]  Name: Brandi Stephens MRN: [id number] Date of Birth: 24-Mar-1946

## 2015-07-30 NOTE — Patient Instructions (Signed)
    Abduction: Clam (Eccentric) - Side-Lying   Lie on side with knees bent. Lift top knee, keeping feet together. Keep trunk steady. Slowly lower for 3-5 seconds. _5-10__ reps per set, _1__ sets per day, __7_ days per week.     Ruben Im PT [facility] [address], [place], [place] [address] Phone # [phone] Fax [phone]  Copyright  VHI. All rights reserved.

## 2015-08-03 ENCOUNTER — Other Ambulatory Visit: Payer: Self-pay | Admitting: General Surgery

## 2015-08-03 DIAGNOSIS — N6310 Unspecified lump in the right breast, unspecified quadrant: Secondary | ICD-10-CM

## 2015-08-04 ENCOUNTER — Encounter: Payer: Self-pay | Admitting: Family Medicine

## 2015-08-04 ENCOUNTER — Ambulatory Visit: Payer: Commercial Managed Care - HMO | Admitting: Physical Therapy

## 2015-08-04 ENCOUNTER — Other Ambulatory Visit: Payer: Self-pay | Admitting: General Surgery

## 2015-08-04 DIAGNOSIS — R293 Abnormal posture: Secondary | ICD-10-CM | POA: Diagnosis not present

## 2015-08-04 DIAGNOSIS — M5442 Lumbago with sciatica, left side: Secondary | ICD-10-CM

## 2015-08-04 DIAGNOSIS — M6281 Muscle weakness (generalized): Secondary | ICD-10-CM

## 2015-08-04 DIAGNOSIS — R2689 Other abnormalities of gait and mobility: Secondary | ICD-10-CM

## 2015-08-04 NOTE — Therapy (Signed)
[facility] [address] [place], [place], [address] Phone: [phone]   Fax:  [phone]  Physical Therapy Treatment  Patient Details  Name: Brandi Stephens MRN: [id number] Date of Birth: [birth year] Referring Provider:  Dr. Redmond School ; Mack Hook NP  Encounter Date: 08/04/2015      PT End of Session - 08/04/15 1422    Visit Number 2   Number of Visits 10   Date for PT Re-Evaluation 2015-09-30   Authorization Type G codes; KX at visit 15   PT Start Time 1400   PT Stop Time 1445   PT Time Calculation (min) 45 min   Activity Tolerance Patient tolerated treatment well      Past Medical History  Diagnosis Date  . Hypertension   . Arthritis   . Hemorrhoids   . PONV (postoperative nausea and vomiting)   . Hypothyroidism   . Breast cancer of lower-outer quadrant of right female breast ([place]) 07/14/2015  . COPD (chronic obstructive pulmonary disease) ([place])   . Anxiety     Past Surgical History  Procedure Laterality Date  . Basal cell carcinoma excision  10/15    nose  . Abdominal hysterectomy  1985  . Breast reduction surgery  1998  . Cervical spine surgery  1995    hallo  . Knee arthroscopy Right 2002  . Colectomy  2005    right side  . Hemorrhoid surgery  2008  . Tonsillectomy    . Tubal ligation    . Hand surgery Left   . Eye surgery Left 07/2014    cataract removed 3 months later    There were no vitals filed for this visit.      Subjective Assessment - 08/04/15 1404    Subjective They found a mass in my other breast.  Awaiting more testing for plan of care.  "It's small and stage 1".  States no excessive pain or fatigue after iniitial eval.  Across low back and hips.     Currently in Pain? Yes   Pain Score 5    Pain Location Back   Pain Orientation Right;Left   Pain Type Chronic pain            [facility] PT Assessment - 08/04/15 0001    Standardized Balance Assessment   Standardized  Balance Assessment --  6MWT                     [facility] Adult PT Treatment/Exercise - 08/04/15 0001    Lumbar Exercises: Aerobic   Stationary Bike Nu-Step L1 5 min   Lumbar Exercises: Supine   Ab Set 5 reps   Bent Knee Raise 5 reps   Isometric Hip Flexion 5 reps   Knee/Hip Exercises: Standing   Gait Training psoas doorway 3x5 with UE movements   Other Standing Knee Exercises 6 MWT  15 laps with LBP, mild knee pain 1200 feet   Other Standing Knee Exercises UE wall slides with glut squeeze 10x; wall climbs 10x   Knee/Hip Exercises: Sidelying   Clams 10x R/L                PT Education - 08/04/15 1422    Education provided Yes   Education Details abdominal progression grade 3   Person(s) Educated Patient   Methods Explanation;Demonstration;Handout   Comprehension Verbalized understanding;Returned demonstration          PT Short Term Goals - 08/04/15 1457  PT SHORT TERM GOAL #1   Title The patient will be able to perform initial HEP for early core muscle activation and strengthening   08/27/15   Time 4   Period Weeks   Status On-going   PT SHORT TERM GOAL #2   Title The patient will be able to stand for 5 min needed for grooming and light meal prep   Time 4   Period Weeks   Status On-going   PT SHORT TERM GOAL #3   Title The patient will be able to ambulate 600 feet in 6 min needed for short distance community ambulation   Time 4   Period Weeks   Status On-going   PT SHORT TERM GOAL #4   Title The patient will report an improvement in pain by 25% with usual ADLS   Time 4   Period Weeks   Status On-going           PT Long Term Goals - 08/04/15 1457    PT LONG TERM GOAL #1   Title The patient will be independent in safe, self progression of HEP for further gains in strength and function   09/24/15   Time 8   Period Weeks   Status On-going   PT LONG TERM GOAL #2   Title The patient will improved abdominal and trunk extensor strength to 4/5  needed for standing 8 min for grooming and light meal prep   Time 8   Period Weeks   Status On-going   PT LONG TERM GOAL #3   Title The patient will be able to ambulate 30 min needed for recovery from surgery and community ambulation   Time 8   Period Weeks   Status On-going   PT LONG TERM GOAL #4   Title The patient will have improved hip abduction strength to 4+/5 needed for greater ease with standing and walking longer periods of time.     Time 8   Period Weeks   Status On-going   PT LONG TERM GOAL #5   Title Overall back pain intensity improved by 50% with usual ADLS.     Time 8   Period Weeks   Status On-going   PT LONG TERM GOAL #6   Title FOTO functional outcome score improved from 56% to 48% indicating improved function with less pain   Time 8   Period Weeks   Status On-going               Plan - 08/04/15 1422    Clinical Impression Statement The patient reports good compliance with low level abdominal bracing in supine and sitting.  She lacks strength for more than 5 reps with bent knee lowers.  She is able to complete the 6 min walk test (1200 feet) without stopping but with the onset of low back pain in the last  2 minutes.  She needs verbal and tactile cues with clam gluteus medius ex.  Her pain in the low back and hips dissipates rather quickly with sitting.  Patient perspires quite a bit with ther ex but she states this is normal for her.  She is highly motivated with PT.  Therapist closely monitoring her response and modifying as needed for pain.     Clinical Impairments Affecting Rehab Potential Breast CA no U/S;  lumbar fusion Dec '16   PT Next Visit Plan Nu-Step; core and gluteal strengthening low level supine, seated with progression to standing as tolerated;  gentle HS and  psoas stretching;  add band to clams      Patient will benefit from skilled therapeutic intervention in order to improve the following deficits and impairments:     Visit  Diagnosis: Bilateral low back pain with left-sided sciatica  Muscle weakness (generalized)  Other abnormalities of gait and mobility  Abnormal posture     Problem List Patient Active Problem List   Diagnosis Date Noted  . Breast cancer of upper-inner quadrant of left female breast ([place]) 07/14/2015  . Prediabetes 07/08/2015  . Chronic kidney disease 07/08/2015  . Dependent edema 06/08/2015  . Sleep disturbance 06/08/2015  . Hyperlipidemia 06/08/2015  . Generalized anxiety disorder 06/08/2015  . Essential hypertension 06/08/2015  . Chronic obstructive pulmonary disease ([place]) 06/08/2015  . Gout 06/08/2015  . Acute delirium 05/17/2015  . HCAP (healthcare-associated pneumonia)   . Macrocytosis   . 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 Altered mental status   . Sepsis ([place]) 05/15/2015  . Lumbar spondylosis 03/20/2015  . Paresthesias 04/01/2013  . Tremor 03/19/2013   Ruben Im, PT 08/04/2015 3:01 PM Phone: [phone] Fax: [phone] Alvera Singh 08/04/2015, 3:01 PM  [facility] [address], [place], [place], [address] Phone: [phone]   Fax:  [phone]  Name: Brandi Stephens MRN: [id number] Date of Birth: 1945/12/03

## 2015-08-04 NOTE — Patient Instructions (Signed)
Brandi Stephens PT [facility] [address], [address] [place], [place] [address] Phone # [phone] Fax [phone]    

## 2015-08-05 ENCOUNTER — Encounter: Payer: Self-pay | Admitting: Physical Therapy

## 2015-08-05 ENCOUNTER — Ambulatory Visit (INDEPENDENT_AMBULATORY_CARE_PROVIDER_SITE_OTHER): Payer: Commercial Managed Care - HMO | Admitting: Psychiatry

## 2015-08-05 ENCOUNTER — Ambulatory Visit: Payer: Commercial Managed Care - HMO | Admitting: Physical Therapy

## 2015-08-05 DIAGNOSIS — C50912 Malignant neoplasm of unspecified site of left female breast: Secondary | ICD-10-CM

## 2015-08-05 DIAGNOSIS — R293 Abnormal posture: Secondary | ICD-10-CM

## 2015-08-05 DIAGNOSIS — F4323 Adjustment disorder with mixed anxiety and depressed mood: Secondary | ICD-10-CM

## 2015-08-05 DIAGNOSIS — M6281 Muscle weakness (generalized): Secondary | ICD-10-CM

## 2015-08-05 DIAGNOSIS — R2689 Other abnormalities of gait and mobility: Secondary | ICD-10-CM

## 2015-08-05 DIAGNOSIS — M5442 Lumbago with sciatica, left side: Secondary | ICD-10-CM

## 2015-08-05 NOTE — Patient Instructions (Signed)
  PNF Strengthening: Resisted:Will add later   Standing with resistive band around each hand, bring right arm up and away, thumb back. Repeat _10___ times per set. Do _2___ sets per session. Do _1-2___ sessions per day.      Resisted Horizontal Abduction: Bilateral: Do this first laying down with knees bent, then in 2 weeks progress to sitting.   Sit or stand, tubing in both hands, arms out in front. Keeping arms straight, pinch shoulder blades together and stretch arms out. Repeat _10___ times per set. Do 2____ sets per session. Do _1-2___ sessions per day.   HIP: Hamstrings - Short Sitting    Rest leg on raised surface or heel on the floor. Keep knee straight. Lift chest, place hands on thighs for support, hinge forward until stretch felt along back of leg. Hold __20_ seconds. _3_ reps per set, __2_ sets per day.  Copyright  VHI. All rights reserved.                      Strengthening: Resisted Extension: Will add later   Hold tubing in right hand, arm forward. Pull arm back, elbow straight. Repeat _10___ times per set. Do _2___ sets per session. Do _1-2___ sessions per day.  Can place band around the front of your body and perform with both arms at the same time.

## 2015-08-05 NOTE — Therapy (Signed)
[facility] [address], [place], [place], [address] Phone: [phone]   Fax:  [phone]  Physical Therapy Treatment  Patient Details  Name: Brandi Stephens MRN: [id number] Date of Birth: May 19, 1945 Referring Provider:  Dr. Redmond School ; Mack Hook NP  Encounter Date: 08/05/2015      PT End of Session - 08/05/15 1624    Visit Number 3   Number of Visits 10   Date for PT Re-Evaluation 2015/10/19   Authorization Type G codes; KX at visit 15   PT Start Time 1535   PT Stop Time 1624   PT Time Calculation (min) 49 min   Activity Tolerance Patient tolerated treatment well   Behavior During Therapy [facility] for tasks assessed/performed      Past Medical History  Diagnosis Date  . Hypertension   . Arthritis   . Hemorrhoids   . PONV (postoperative nausea and vomiting)   . Hypothyroidism   . Breast cancer of lower-outer quadrant of right female breast ([place]) 07/14/2015  . COPD (chronic obstructive pulmonary disease) ([name])   . Anxiety     Past Surgical History  Procedure Laterality Date  . Basal cell carcinoma excision  10/15    nose  . Abdominal hysterectomy  1985  . Breast reduction surgery  1998  . Cervical spine surgery  1995    hallo  . Knee arthroscopy Right 2002  . Colectomy  2005    right side  . Hemorrhoid surgery  2008  . Tonsillectomy    . Tubal ligation    . Hand surgery Left   . Eye surgery Left 07/2014    cataract removed 3 months later    There were no vitals filed for this visit.      Subjective Assessment - 08/05/15 1539    Subjective Sore from workout yesterday.   Currently in Pain? Yes   Pain Score 5    Pain Location Back   Pain Orientation Right;Left   Pain Descriptors / Indicators Sore   Aggravating Factors  Overdoing it.    Pain Relieving Factors Pacing, laying down.    Multiple Pain Sites No                         [facility] Adult PT Treatment/Exercise - 08/05/15  0001    Lumbar Exercises: Stretches   Active Hamstring Stretch 3 reps;20 seconds   Single Knee to Chest Stretch 2 reps;20 seconds   Lumbar Exercises: Aerobic   Stationary Bike Nustep L1 x10 min   Lumbar Exercises: Supine   Ab Set 10 reps;2 seconds   Glut Set --  Ball between knees squeeze, glute cocontraction 7x2  3 sec    Bent Knee Raise 10 reps   Other Supine Lumbar Exercises Yellow band horizontal abd 2 x6   Emphasis on core stabilization   Knee/Hip Exercises: Sidelying   Clams added yellow band 6x bil                PT Education - 08/05/15 1610    Education provided Yes   Education Details Yellow band for clams and supine horizontal abd with core contraction, Seated hamstring stretch   Person(s) Educated Patient   Methods Explanation;Demonstration;Tactile cues;Verbal cues;Handout   Comprehension Returned demonstration;Verbalized understanding          PT Short Term Goals - 08/04/15 1457    PT SHORT TERM GOAL #1   Title The patient will be  able to perform initial HEP for early core muscle activation and strengthening   08/27/15   Time 4   Period Weeks   Status On-going   PT SHORT TERM GOAL #2   Title The patient will be able to stand for 5 min needed for grooming and light meal prep   Time 4   Period Weeks   Status On-going   PT SHORT TERM GOAL #3   Title The patient will be able to ambulate 600 feet in 6 min needed for short distance community ambulation   Time 4   Period Weeks   Status On-going   PT SHORT TERM GOAL #4   Title The patient will report an improvement in pain by 25% with usual ADLS   Time 4   Period Weeks   Status On-going           PT Long Term Goals - 08/04/15 1457    PT LONG TERM GOAL #1   Title The patient will be independent in safe, self progression of HEP for further gains in strength and function   09/24/15   Time 8   Period Weeks   Status On-going   PT LONG TERM GOAL #2   Title The patient will improved abdominal and trunk  extensor strength to 4/5 needed for standing 8 min for grooming and light meal prep   Time 8   Period Weeks   Status On-going   PT LONG TERM GOAL #3   Title The patient will be able to ambulate 30 min needed for recovery from surgery and community ambulation   Time 8   Period Weeks   Status On-going   PT LONG TERM GOAL #4   Title The patient will have improved hip abduction strength to 4+/5 needed for greater ease with standing and walking longer periods of time.     Time 8   Period Weeks   Status On-going   PT LONG TERM GOAL #5   Title Overall back pain intensity improved by 50% with usual ADLS.     Time 8   Period Weeks   Status On-going   PT LONG TERM GOAL #6   Title FOTO functional outcome score improved from 56% to 48% indicating improved function with less pain   Time 8   Period Weeks   Status On-going         Breast Clinic Goals - 07/22/15 1710    Patient will be able to verbalize understanding of pertinent lymphedema risk reduction practices relevant to her diagnosis specifically related to skin care.   Time 1   Period Days   Status Achieved   Patient will be able to return demonstrate and/or verbalize understanding of the post-op home exercise program related to regaining shoulder range of motion.   Time 1   Period Days   Status Achieved   Patient will be able to verbalize understanding of the importance of attending the postoperative After Breast Cancer Class for further lymphedema risk reduction education and therapeutic exercise.   Time 1   Period Days   Status Achieved              Plan - 08/05/15 1624    Clinical Impression Statement Pt tolerated almost 50 minutes of continual exercise today. She tolerated core and hip strengthening very well today, in fact  she tended to do more than asked. There was no pain throughout the session. HEP was added to .    Rehab Potential  Good   Clinical Impairments Affecting Rehab Potential Breast CA no U/S;  lumbar  fusion Dec '16   PT Frequency 2x / week   PT Duration 8 weeks   PT Treatment/Interventions ADLs/Self Care Home Management;Cryotherapy;Electrical Stimulation;Moist Heat;Therapeutic exercise;Patient/family education;Neuromuscular re-education;Manual techniques;Taping   PT Next Visit Plan Nustep, Core & hip strength, review hamstring streching.   Consulted and Agree with Plan of Care Patient      Patient will benefit from skilled therapeutic intervention in order to improve the following deficits and impairments:  Decreased activity tolerance, Decreased endurance, Decreased range of motion, Decreased strength, Difficulty walking, Pain  Visit Diagnosis: Bilateral low back pain with left-sided sciatica  Muscle weakness (generalized)  Abnormal posture  Other abnormalities of gait and mobility  Breast cancer, female, left     Problem List Patient Active Problem List   Diagnosis Date Noted  . Breast cancer of upper-inner quadrant of left female breast ([place]) 07/14/2015  . Prediabetes 07/08/2015  . Chronic kidney disease 07/08/2015  . Dependent edema 06/08/2015  . Sleep disturbance 06/08/2015  . Hyperlipidemia 06/08/2015  . Generalized anxiety disorder 06/08/2015  . Essential hypertension 06/08/2015  . Chronic obstructive pulmonary disease ([place]) 06/08/2015  . Gout 06/08/2015  . Acute delirium 05/17/2015  . HCAP (healthcare-associated pneumonia)   . Macrocytosis   . 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 Altered mental status   . Sepsis ([place]) 05/15/2015  . Lumbar spondylosis 03/20/2015  . Paresthesias 04/01/2013  . Tremor 03/19/2013    Chad Donoghue, PTA 08/05/2015, 4:30 PM  [facility] [address] [place], [place], [address] Phone: [phone]   Fax:  [phone]  Name: Brandi Stephens MRN: [id number] Date of Birth: [birth year]

## 2015-08-06 ENCOUNTER — Encounter: Payer: Self-pay | Admitting: *Deleted

## 2015-08-10 ENCOUNTER — Ambulatory Visit (INDEPENDENT_AMBULATORY_CARE_PROVIDER_SITE_OTHER): Payer: Commercial Managed Care - HMO | Admitting: Family Medicine

## 2015-08-10 ENCOUNTER — Ambulatory Visit: Payer: Commercial Managed Care - HMO | Admitting: Physical Therapy

## 2015-08-10 ENCOUNTER — Other Ambulatory Visit: Payer: Self-pay | Admitting: General Surgery

## 2015-08-10 ENCOUNTER — Encounter: Payer: Self-pay | Admitting: Physical Therapy

## 2015-08-10 ENCOUNTER — Encounter: Payer: Self-pay | Admitting: Family Medicine

## 2015-08-10 VITALS — BP 122/60 | HR 68 | Wt 246.6 lb

## 2015-08-10 DIAGNOSIS — R609 Edema, unspecified: Secondary | ICD-10-CM

## 2015-08-10 DIAGNOSIS — D72829 Elevated white blood cell count, unspecified: Secondary | ICD-10-CM | POA: Diagnosis not present

## 2015-08-10 DIAGNOSIS — M5442 Lumbago with sciatica, left side: Secondary | ICD-10-CM

## 2015-08-10 DIAGNOSIS — Z862 Personal history of diseases of the blood and blood-forming organs and certain disorders involving the immune mechanism: Secondary | ICD-10-CM

## 2015-08-10 DIAGNOSIS — R748 Abnormal levels of other serum enzymes: Secondary | ICD-10-CM | POA: Diagnosis not present

## 2015-08-10 DIAGNOSIS — M6281 Muscle weakness (generalized): Secondary | ICD-10-CM

## 2015-08-10 DIAGNOSIS — M1000 Idiopathic gout, unspecified site: Secondary | ICD-10-CM | POA: Diagnosis not present

## 2015-08-10 DIAGNOSIS — G2581 Restless legs syndrome: Secondary | ICD-10-CM | POA: Diagnosis not present

## 2015-08-10 DIAGNOSIS — N6310 Unspecified lump in the right breast, unspecified quadrant: Secondary | ICD-10-CM

## 2015-08-10 DIAGNOSIS — R7989 Other specified abnormal findings of blood chemistry: Secondary | ICD-10-CM

## 2015-08-10 DIAGNOSIS — I10 Essential (primary) hypertension: Secondary | ICD-10-CM

## 2015-08-10 DIAGNOSIS — R293 Abnormal posture: Secondary | ICD-10-CM

## 2015-08-10 DIAGNOSIS — R2689 Other abnormalities of gait and mobility: Secondary | ICD-10-CM

## 2015-08-10 NOTE — Progress Notes (Addendum)
Subjective:    Patient ID: Brandi Stephens, female    DOB: 1945/05/12, 70 y.o.   MRN: [id number]  HPI Chief Complaint  Patient presents with  . 1 month follow-up    1 month follow-up.  numbness on toes and bottom of feet. having some restless leg problems   She is here for a follow up on HTN, dependent edema and bilateral leg discomfort. Patient has been taking daily Diovan-HCT.  She is scheduled to have a ultrasound with possible biopsy of right breast tomorrow. She was diagnosed with grade 1 breast cancer of left breast and is scheduled to have surgery with lymph node removal.  She is going to physical therapy for back pain.   Taking 1 tramadol at night mainly for bilateral lower leg pain that she describes as restless leg syndrome. She is taking 800 Ibuprofen as needed for pain. Also taking neurontin 300 mg twice daily. States she does not think this medication is helping with leg pain and would like to stop it. Would also like to try a different medication for RLS.  Takes daily Allopurinol, denies gout flare for past couple of months.   Reports foot and lower leg edema has improved significantly and would like to cut back on Lasix.  She has had trouble sleeping for years, states she goes to bed and gets up multiple times during the night due to leg discomfort, states her lower legs feel like they are "twitching" and she has a constant sensation that she needs to move them at night until she eventually falls asleep. She denies leg cramps or spasms, states her has a sensation that she cannot describe and if she moves her legs the discomfort goes away temporarily. Denies numbness, tingling, weakness.  Reports sleeping soundly from about 5 or 6 am until 9 am. Occasionally takes a 20 minute nap during the day.  Denies fever, chills, unexplained weight loss, worsening fatigue, headache, neck pain, nasal congestion, sore throat, cough, chest pain, DOE, orthopnea, GI or GU symptoms.  She is seeing  a psychiatrist and psychologist Kinnie Scales. She reports feeling positive about life and is doing well emotionally.    Review of Systems Pertinent positives and negatives in the history of present illness.     Objective:   Physical Exam BP 122/60 mmHg  Pulse 68  Wt 246 lb 9.6 oz (111.857 kg)  Alert and in no distress.  Neck is supple without adenopathy or thyromegaly. Cardiac exam shows a regular sinus rhythm without murmurs or gallops. Lungs are clear to auscultation. Extremity exam reveals trace of edema to left foot and ankle, no edema to right foot or ankle. Bilateral foot exam shows normal cap refill, color, temperature, sensation, pulse, ROM are equal and normal bilaterally, no skin breakdown or deformities. CN II-XII intact, DTRs symmetric and normal. Gait normal.       Assessment & Plan:  Essential hypertension  Idiopathic gout, unspecified chronicity, unspecified site - Plan: Uric acid  Dependent edema  Elevated serum creatinine - Plan: Basic metabolic panel  History of anemia - Plan: CBC with Differential/Platelet  Blood pressure is within normal range today, no intervention needed.  She has not had a gout flare in a couple of months and will continue taking daily Allopurinol, will check uric acid.  She will cut back Lasix to PRN for LE edema, per patient request and I think this is reasonable. She will also take potassium supplement with Lasix only.  Will assess creatinine and  kidney function.  Patient would like to stop Neurontin since she does not think it is helping with RLS, I am ok with this. She will taper off the medication over the next 7 days. I will get labs and then switch her to Requip after tapering off Neurontin.   Overall I think she is doing quite well physically and mentally and recommend she continue seeing her therapist and going for PT.  Wished her good luck with her [place] and biopsy of breast tomorrow and continued treatment of breast cancer.  Will  follow up pending labs and have her return in approximately 2 months or sooner if needed.

## 2015-08-10 NOTE — Therapy (Signed)
[facility] [address], [place], [place], [address] Phone: [phone]   Fax:  [phone]  Physical Therapy Treatment  Patient Details  Name: Brandi Stephens MRN: [id number] Date of Birth: Mar 20, 1946 Referring Provider:  Dr. Redmond School ; Mack Hook NP  Encounter Date: 08/10/2015      PT End of Session - 08/10/15 1240    Visit Number 4   Number of Visits 10   Date for PT Re-Evaluation 10/17/2015   Authorization Type G codes; KX at visit 15   PT Start Time 1237  Pt a little late then had to leave early   PT Stop Time 1310   PT Time Calculation (min) 33 min   Activity Tolerance Patient tolerated treatment well   Behavior During Therapy [facility] for tasks assessed/performed      Past Medical History  Diagnosis Date  . Hypertension   . Arthritis   . Hemorrhoids   . PONV (postoperative nausea and vomiting)   . Hypothyroidism   . Breast cancer of lower-outer quadrant of right female breast ([place]) 07/14/2015  . COPD (chronic obstructive pulmonary disease) ([place])   . Anxiety     Past Surgical History  Procedure Laterality Date  . Basal cell carcinoma excision  10/15    nose  . Abdominal hysterectomy  1985  . Breast reduction surgery  1998  . Cervical spine surgery  1995    hallo  . Knee arthroscopy Right 2002  . Colectomy  2005    right side  . Hemorrhoid surgery  2008  . Tonsillectomy    . Tubal ligation    . Hand surgery Left   . Eye surgery Left 07/2014    cataract removed 3 months later    There were no vitals filed for this visit.      Subjective Assessment - 08/10/15 1239    Subjective Felt ok after the treatment, but her arthritis is making her back ache. Has to leave early and go to another MD appt to check her LT breast for CA.   Currently in Pain? Yes   Pain Score 5    Pain Location Back   Pain Orientation Right;Left;Lower   Pain Descriptors / Indicators Dull;Aching   Aggravating Factors   Maybe weather   Pain Relieving Factors Pacing, laying down   Multiple Pain Sites No                         [facility] Adult PT Treatment/Exercise - 08/10/15 0001    Lumbar Exercises: Aerobic   Stationary Bike L2 x 10 min   Lumbar Exercises: Standing   Other Standing Lumbar Exercises Weight shifting 1 min each way   Lumbar Exercises: Supine   Glut Set --  Ball between knees squeeze, glute cocontraction 7x2  3 sec    Bent Knee Raise 20 reps   Other Supine Lumbar Exercises Red band horizontal abd 2x10   Emphasis on core   Knee/Hip Exercises: Standing   Hip Abduction AROM;Stengthening;Both;2 sets;10 reps   Hip Extension AROM;Stengthening;Both;2 sets;10 reps;Knee straight                PT Education - 08/10/15 1306    Education provided Yes   Education Details Standing hip abduction & extension bil   Person(s) Educated Patient   Methods Explanation;Demonstration;Tactile cues;Verbal cues   Comprehension Returned demonstration;Verbalized understanding          PT Short Term Goals -  08/10/15 1246    PT SHORT TERM GOAL #1   Title The patient will be able to perform initial HEP for early core muscle activation and strengthening   08/27/15   Time 4   Period Weeks   Status Achieved   PT SHORT TERM GOAL #2   Title The patient will be able to stand for 5 min needed for grooming and light meal prep   Time 4   Period Weeks   Status Achieved   PT SHORT TERM GOAL #4   Title The patient will report an improvement in pain by 25% with usual ADLS   Time 4   Period Weeks   Status On-going  20%           PT Long Term Goals - 08/04/15 1457    PT LONG TERM GOAL #1   Title The patient will be independent in safe, self progression of HEP for further gains in strength and function   09/24/15   Time 8   Period Weeks   Status On-going   PT LONG TERM GOAL #2   Title The patient will improved abdominal and trunk extensor strength to 4/5 needed for standing 8 min for  grooming and light meal prep   Time 8   Period Weeks   Status On-going   PT LONG TERM GOAL #3   Title The patient will be able to ambulate 30 min needed for recovery from surgery and community ambulation   Time 8   Period Weeks   Status On-going   PT LONG TERM GOAL #4   Title The patient will have improved hip abduction strength to 4+/5 needed for greater ease with standing and walking longer periods of time.     Time 8   Period Weeks   Status On-going   PT LONG TERM GOAL #5   Title Overall back pain intensity improved by 50% with usual ADLS.     Time 8   Period Weeks   Status On-going   PT LONG TERM GOAL #6   Title FOTO functional outcome score improved from 56% to 48% indicating improved function with less pain   Time 8   Period Weeks   Status On-going         Breast Clinic Goals - 07/22/15 1710    Patient will be able to verbalize understanding of pertinent lymphedema risk reduction practices relevant to her diagnosis specifically related to skin care.   Time 1   Period Days   Status Achieved   Patient will be able to return demonstrate and/or verbalize understanding of the post-op home exercise program related to regaining shoulder range of motion.   Time 1   Period Days   Status Achieved   Patient will be able to verbalize understanding of the importance of attending the postoperative After Breast Cancer Class for further lymphedema risk reduction education and therapeutic exercise.   Time 1   Period Days   Status Achieved              Plan - 08/10/15 1307    Clinical Impression Statement Standing tolerance is up to 5-6 minutes at this time, meeting STG.       Patient will benefit from skilled therapeutic intervention in order to improve the following deficits and impairments:  Decreased activity tolerance, Decreased endurance, Decreased range of motion, Decreased strength, Difficulty walking, Pain  Visit Diagnosis: Bilateral low back pain with  left-sided sciatica  Muscle weakness (generalized)  Abnormal  posture  Other abnormalities of gait and mobility     Problem List Patient Active Problem List   Diagnosis Date Noted  . 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 Prediabetes 07/08/2015  . Chronic kidney disease 07/08/2015  . Dependent edema 06/08/2015  . Sleep disturbance 06/08/2015  . Hyperlipidemia 06/08/2015  . Generalized anxiety disorder 06/08/2015  . Essential hypertension 06/08/2015  . Chronic obstructive pulmonary disease ([place]) 06/08/2015  . Gout 06/08/2015  . Acute delirium 05/17/2015  . HCAP (healthcare-associated pneumonia)   . Macrocytosis   . 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 Altered mental status   . Sepsis ([place]) 05/15/2015  . Lumbar spondylosis 03/20/2015  . Paresthesias 04/01/2013  . Tremor 03/19/2013    Giomar Gusler, PTA 08/10/2015, 2:24 PM   [facility] [address] [place], [place], [address] Phone: [phone]   Fax:  [phone]  Name: MONIFAH NARDIELLO MRN: [id number] Date of Birth: 01/22/1946

## 2015-08-11 ENCOUNTER — Ambulatory Visit
Admission: RE | Admit: 2015-08-11 | Discharge: 2015-08-11 | Disposition: A | Discharge status: Home / Self Care | Payer: Commercial Managed Care - HMO | Source: Ambulatory Visit | Attending: General Surgery | Admitting: General Surgery

## 2015-08-11 ENCOUNTER — Telehealth: Payer: Self-pay | Admitting: Family Medicine

## 2015-08-11 ENCOUNTER — Encounter: Payer: Self-pay | Admitting: Physical Therapy

## 2015-08-11 ENCOUNTER — Other Ambulatory Visit: Payer: Self-pay | Admitting: General Surgery

## 2015-08-11 DIAGNOSIS — N6310 Unspecified lump in the right breast, unspecified quadrant: Secondary | ICD-10-CM

## 2015-08-11 LAB — CBC WITH DIFFERENTIAL/PLATELET
BASOS PCT: 1 %
Basophils Absolute: 134 cells/uL (ref 0–200)
EOS ABS: 268 {cells}/uL (ref 15–500)
Eosinophils Relative: 2 %
HCT: 45.8 % — ABNORMAL HIGH (ref 35.0–45.0)
HEMOGLOBIN: 15.1 g/dL (ref 11.7–15.5)
LYMPHS ABS: 2680 {cells}/uL (ref 850–3900)
LYMPHS PCT: 20 %
MCH: 30.5 pg (ref 27.0–33.0)
MCHC: 33 g/dL (ref 32.0–36.0)
MCV: 92.5 fL (ref 80.0–100.0)
MONO ABS: 670 {cells}/uL (ref 200–950)
MPV: 11.1 fL (ref 7.5–12.5)
Monocytes Relative: 5 %
NEUTROS PCT: 72 %
Neutro Abs: 9648 cells/uL — ABNORMAL HIGH (ref 1500–7800)
Platelets: 293 10*3/uL (ref 140–400)
RBC: 4.95 MIL/uL (ref 3.80–5.10)
RDW: 15.1 % — ABNORMAL HIGH (ref 11.0–15.0)
WBC: 13.4 10*3/uL — AB (ref 4.0–10.5)

## 2015-08-11 LAB — BASIC METABOLIC PANEL
BUN: 24 mg/dL (ref 7–25)
CHLORIDE: 106 mmol/L (ref 98–110)
CO2: 24 mmol/L (ref 20–31)
CREATININE: 1.06 mg/dL — AB (ref 0.50–0.99)
Calcium: 9.7 mg/dL (ref 8.6–10.4)
GLUCOSE: 102 mg/dL — AB (ref 65–99)
POTASSIUM: 5.2 mmol/L (ref 3.5–5.3)
Sodium: 140 mmol/L (ref 135–146)

## 2015-08-11 LAB — URIC ACID: Uric Acid, Serum: 8.9 mg/dL — ABNORMAL HIGH (ref 2.4–7.0)

## 2015-08-11 MED ORDER — LEVOTHYROXINE SODIUM 75 MCG PO TABS: 75.0000 ug | ORAL_TABLET | Freq: Every day | ORAL | Status: DC

## 2015-08-11 MED ORDER — ROPINIROLE HCL 0.25 MG PO TABS: 0.2500 mg | ORAL_TABLET | Freq: Every day | ORAL | Status: DC

## 2015-08-11 MED ORDER — VALSARTAN-HYDROCHLOROTHIAZIDE 80-12.5 MG PO TABS: 1.0000 | ORAL_TABLET | Freq: Every day | ORAL | Status: DC

## 2015-08-11 MED ORDER — GABAPENTIN 100 MG PO CAPS: ORAL_CAPSULE | ORAL | Status: DC

## 2015-08-11 MED ORDER — TRAMADOL HCL 50 MG PO TABS: 50.0000 mg | ORAL_TABLET | Freq: Four times a day (QID) | ORAL | Status: DC | PRN

## 2015-08-11 MED ORDER — FLUTICASONE PROPIONATE HFA 110 MCG/ACT IN AERO: 2.0000 | INHALATION_SPRAY | Freq: Two times a day (BID) | RESPIRATORY_TRACT | Status: DC

## 2015-08-11 MED ORDER — ALLOPURINOL 100 MG PO TABS: 200.0000 mg | ORAL_TABLET | Freq: Every day | ORAL | Status: DC

## 2015-08-11 NOTE — Addendum Note (Signed)
Addended by: Girtha Rm on: 08/11/2015 08:24 AM   Modules accepted: Orders

## 2015-08-11 NOTE — Telephone Encounter (Signed)
Pt and her daughter were notified to use warm compress and can take aleve or ibuprofen and discuss this with cancer provider

## 2015-08-11 NOTE — Telephone Encounter (Signed)
Call and ask her if they mean thrombophlebitis? Inflammation of the vein. I'm assuming the mean to the right breast?  Warm compresses and an anti-inflammatory (Aleve or Ibuprofen) is best for this type of pain. It typically will go away on its own. I would address this with her cancer provider also.

## 2015-08-11 NOTE — Telephone Encounter (Signed)
Brandi Stephens called and states they went for [place] of rt breast and was determined she needed MRI guided biopsy.  She is having pain in her rt breast and it was determined she had thrombosis in her veins and recommended warm compresses.  Can you recommend anything else for her.  Please call her [phone]

## 2015-08-11 NOTE — Addendum Note (Signed)
Addended by: Minette Headland A on: 08/11/2015 11:31 AM   Modules accepted: Orders, Medications

## 2015-08-11 NOTE — Telephone Encounter (Signed)
Pt was notified of results

## 2015-08-12 ENCOUNTER — Encounter: Payer: Self-pay | Admitting: Physical Therapy

## 2015-08-13 ENCOUNTER — Ambulatory Visit (INDEPENDENT_AMBULATORY_CARE_PROVIDER_SITE_OTHER): Payer: Commercial Managed Care - HMO | Admitting: Psychiatry

## 2015-08-13 DIAGNOSIS — F4323 Adjustment disorder with mixed anxiety and depressed mood: Secondary | ICD-10-CM

## 2015-08-14 ENCOUNTER — Encounter: Payer: Self-pay | Admitting: Family Medicine

## 2015-08-17 ENCOUNTER — Other Ambulatory Visit: Payer: Self-pay | Admitting: General Surgery

## 2015-08-17 DIAGNOSIS — C50212 Malignant neoplasm of upper-inner quadrant of left female breast: Secondary | ICD-10-CM

## 2015-08-18 ENCOUNTER — Ambulatory Visit: Payer: Commercial Managed Care - HMO | Attending: Oncology | Admitting: Physical Therapy

## 2015-08-18 DIAGNOSIS — M5442 Lumbago with sciatica, left side: Secondary | ICD-10-CM | POA: Insufficient documentation

## 2015-08-18 DIAGNOSIS — R2689 Other abnormalities of gait and mobility: Secondary | ICD-10-CM | POA: Insufficient documentation

## 2015-08-18 DIAGNOSIS — R293 Abnormal posture: Secondary | ICD-10-CM | POA: Insufficient documentation

## 2015-08-18 DIAGNOSIS — M6281 Muscle weakness (generalized): Secondary | ICD-10-CM | POA: Diagnosis present

## 2015-08-18 NOTE — Therapy (Signed)
[facility] [address], [place], [place], [address] Phone: [phone]   Fax:  [phone]  Physical Therapy Treatment  Patient Details  Name: Brandi [place] MRN: [id number] Date of Birth: [birth year] Referring Provider:  Dr. Redmond School ; Mack Hook NP  Encounter Date: 08/18/2015      PT End of Session - 08/18/15 1711    Visit Number 5   Number of Visits 10   Date for PT Re-Evaluation Oct 20, 2015   Authorization Type G codes; KX at visit 15   PT Start Time 1400   PT Stop Time 1444   PT Time Calculation (min) 44 min   Activity Tolerance Patient tolerated treatment well      Past Medical History  Diagnosis Date  . Hypertension   . Arthritis   . Hemorrhoids   . PONV (postoperative nausea and vomiting)   . Hypothyroidism   . Breast cancer of lower-outer quadrant of right female breast ([place]) 07/14/2015  . COPD (chronic obstructive pulmonary disease) ([place])   . Anxiety   . Prediabetes     Past Surgical History  Procedure Laterality Date  . Basal cell carcinoma excision  10/15    nose  . Abdominal hysterectomy  1985  . Breast reduction surgery  1998  . Cervical spine surgery  1995    hallo  . Knee arthroscopy Right 2002  . Colectomy  2005    right side  . Hemorrhoid surgery  2008  . Tonsillectomy    . Tubal ligation    . Hand surgery Left   . Eye surgery Left 07/2014    cataract removed 3 months later    There were no vitals filed for this visit.      Subjective Assessment - 08/18/15 1401    Subjective Had a U/S biopsy last Tuesday.  Will do MRI guided biopsy on Monday.  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  I'm taking ibuprofen for this pain.  Patient reports no restrictions.  I've been using theraband at home.     Currently in Pain? Yes   Pain Score 5    Pain Location Back   Pain Orientation Right;Left;Lower                         [facility] Adult PT Treatment/Exercise - 08/18/15  0001    Lumbar Exercises: Stretches   Active Hamstring Stretch 3 reps;20 seconds   Lumbar Exercises: Aerobic   Stationary Bike 5 min level 1 bike   Lumbar Exercises: Chief Strategy Officer;Theraband;10 reps   Theraband Level (Row) Level 3 (Green)   Shoulder Extension Strengthening;Both;10 reps;Theraband   Theraband Level (Shoulder Extension) Level 3 (Green)   Other Standing Lumbar Exercises Wall UE reaches with gluteal squeeze   Other Standing Lumbar Exercises Red band UE press, overheads and march 5x each   Lumbar Exercises: Seated   Sit to Stand 10 reps   Sit to Stand Limitations high table   Other Seated Lumbar Exercises hip swings 2# plyo ball golf swings, chops, Charlie Browns 30 sec each   Lumbar Exercises: Supine   Ab Set 5 reps   Clam 20 reps  green band   Isometric Hip Flexion 5 reps   Other Supine Lumbar Exercises ball squeeze with abd brace   Knee/Hip Exercises: Sidelying   Clams 5 x left  PT Short Term Goals - 08/18/15 1716    PT SHORT TERM GOAL #1   Title The patient will be able to perform initial HEP for early core muscle activation and strengthening   08/27/15   Status Achieved   PT SHORT TERM GOAL #2   Title The patient will be able to stand for 5 min needed for grooming and light meal prep   Status Achieved   PT SHORT TERM GOAL #3   Title The patient will be able to ambulate 600 feet in 6 min needed for short distance community ambulation   Time 4   Period Weeks   Status On-going   PT SHORT TERM GOAL #4   Title The patient will report an improvement in pain by 25% with usual ADLS   Time 4   Period Weeks   Status On-going           PT Long Term Goals - 08/18/15 1717    PT LONG TERM GOAL #1   Title The patient will be independent in safe, self progression of HEP for further gains in strength and function   09/24/15   Time 8   Period Weeks   Status On-going   PT LONG TERM GOAL #2   Title The patient will improved  abdominal and trunk extensor strength to 4/5 needed for standing 8 min for grooming and light meal prep   Period Weeks   Status On-going   PT LONG TERM GOAL #3   Title The patient will be able to ambulate 30 min needed for recovery from surgery and community ambulation   Period Weeks   Status On-going   PT LONG TERM GOAL #4   Title The patient will have improved hip abduction strength to 4+/5 needed for greater ease with standing and walking longer periods of time.     Time 8   Period Weeks   Status On-going   PT LONG TERM GOAL #5   Title Overall back pain intensity improved by 50% with usual ADLS.     Time 8   Period Weeks   Status On-going   PT LONG TERM GOAL #6   Title FOTO functional outcome score improved from 56% to 48% indicating improved function with less pain   Time 8   Period Weeks   Status On-going               Plan - 08/18/15 1711    Clinical Impression Statement The patient will undergo MRI biopsy of the breast on Monday so cancelled Tuesday PT visit.  The patient reports continued limitation of standing and walking secondary to back pain however she is not fearful of physical activity and is willing to attempt all exercises with sitting rest breaks between standing ex.  Single limb standing or narrow base of support exercises tend to produce increased back pain in standing. Therapist closely monitoring all with modifications for pain.    Clinical Impairments Affecting Rehab Potential Breast CA no U/S;  lumbar fusion Dec '16   PT Next Visit Plan Nustep, Core & hip strength low to moderate level;  increase standing tolerance time;  check 6 min walk test time for STG      Patient will benefit from skilled therapeutic intervention in order to improve the following deficits and impairments:     Visit Diagnosis: Bilateral low back pain with left-sided sciatica  Muscle weakness (generalized)  Abnormal posture  Other abnormalities of gait and  mobility  Problem List Patient Active Problem List   Diagnosis Date Noted  . Breast cancer of upper-inner quadrant of left female breast ([place]) 07/14/2015  . Prediabetes 07/08/2015  . Chronic kidney disease 07/08/2015  . Dependent edema 06/08/2015  . Sleep disturbance 06/08/2015  . Hyperlipidemia 06/08/2015  . Generalized anxiety disorder 06/08/2015  . Essential hypertension 06/08/2015  . Chronic obstructive pulmonary disease ([place]) 06/08/2015  . Gout 06/08/2015  . Acute delirium 05/17/2015  . HCAP (healthcare-associated pneumonia)   . Macrocytosis   . 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 Altered mental status   . Sepsis ([place]) 05/15/2015  . Lumbar spondylosis 03/20/2015  . Paresthesias 04/01/2013  . Tremor 03/19/2013   Ruben Im, PT 08/18/2015 5:19 PM Phone: [phone] Fax: [phone] Alvera Singh 08/18/2015, 5:18 PM  [facility] [address] [place], [place], [address] Phone: [phone]   Fax:  [phone]  Name: Brandi [place] MRN: [id number] Date of Birth: Dec 11, 1945

## 2015-08-20 ENCOUNTER — Encounter: Payer: Self-pay | Admitting: Physical Therapy

## 2015-08-20 ENCOUNTER — Ambulatory Visit: Payer: Commercial Managed Care - HMO | Admitting: Physical Therapy

## 2015-08-20 ENCOUNTER — Ambulatory Visit (INDEPENDENT_AMBULATORY_CARE_PROVIDER_SITE_OTHER): Payer: Commercial Managed Care - HMO | Admitting: Psychiatry

## 2015-08-20 DIAGNOSIS — R2689 Other abnormalities of gait and mobility: Secondary | ICD-10-CM

## 2015-08-20 DIAGNOSIS — R293 Abnormal posture: Secondary | ICD-10-CM

## 2015-08-20 DIAGNOSIS — M6281 Muscle weakness (generalized): Secondary | ICD-10-CM

## 2015-08-20 DIAGNOSIS — M5442 Lumbago with sciatica, left side: Secondary | ICD-10-CM

## 2015-08-20 DIAGNOSIS — F4323 Adjustment disorder with mixed anxiety and depressed mood: Secondary | ICD-10-CM

## 2015-08-20 NOTE — Therapy (Signed)
[facility] [address], [place], [place], [address] Phone: [phone]   Fax:  [phone]  Physical Therapy Treatment  Patient Details  Name: Brandi [place] MRN: [id number] Date of Birth: 1945/08/11 Referring Provider:  Dr. Redmond School ; Mack Hook NP  Encounter Date: 08/20/2015      PT End of Session - 08/20/15 1708    Visit Number 6   Number of Visits 10   Date for PT Re-Evaluation October 06, 2015   Authorization Type G codes; KX at visit 15   PT Start Time 1540   PT Stop Time 1615   PT Time Calculation (min) 35 min   Activity Tolerance Patient tolerated treatment well      Past Medical History  Diagnosis Date  . Hypertension   . Arthritis   . Hemorrhoids   . PONV (postoperative nausea and vomiting)   . Hypothyroidism   . Breast cancer of lower-outer quadrant of right female breast ([place]) 07/14/2015  . COPD (chronic obstructive pulmonary disease) ([place])   . Anxiety   . Prediabetes     Past Surgical History  Procedure Laterality Date  . Basal cell carcinoma excision  10/15    nose  . Abdominal hysterectomy  1985  . Breast reduction surgery  1998  . Cervical spine surgery  1995    hallo  . Knee arthroscopy Right 2002  . Colectomy  2005    right side  . Hemorrhoid surgery  2008  . Tonsillectomy    . Tubal ligation    . Hand surgery Left   . Eye surgery Left 07/2014    cataract removed 3 months later    There were no vitals filed for this visit.      Subjective Assessment - 08/20/15 1538    Subjective Patient arrives 8 min late, coming from another appt.  Right low back pain.  Went to the movies yesterday and couldn't get comfortable.  Left side is most comfortable.  Lying on back is OK.     Currently in Pain? Yes   Pain Score 6    Pain Location Back                         [facility] Adult PT Treatment/Exercise - 08/20/15 0001    Lumbar Exercises: Stretches   Active Hamstring  Stretch 3 reps;20 seconds   Lumbar Exercises: Aerobic   Stationary Bike Nu-step L1 8 min   Lumbar Exercises: Supine   Ab Set 10 reps   Clam 20 reps  green band   Isometric Hip Flexion 10 reps   Other Supine Lumbar Exercises red ball rolls hipknee flex 10x   Other Supine Lumbar Exercises red ball lumbar rotation 10x    Modalities   Modalities Electrical Stimulation   Electrical Stimulation   Electrical Stimulation Location right lumbar   Electrical Stimulation Action Pre-mod   Electrical Stimulation Parameters 8 ma 10 min  patient has another appt   Electrical Stimulation Goals Pain                  PT Short Term Goals - 08/20/15 1719    PT SHORT TERM GOAL #1   Title The patient will be able to perform initial HEP for early core muscle activation and strengthening   08/27/15   Status Achieved   PT SHORT TERM GOAL #2   Title The patient will be able to stand for 5 min needed for  grooming and light meal prep   Status Achieved   PT SHORT TERM GOAL #3   Title The patient will be able to ambulate 600 feet in 6 min needed for short distance community ambulation   Time 4   Period Weeks   Status On-going   PT SHORT TERM GOAL #4   Title The patient will report an improvement in pain by 25% with usual ADLS   Time 4   Period Weeks   Status On-going           PT Long Term Goals - 08/20/15 1719    PT LONG TERM GOAL #1   Title The patient will be independent in safe, self progression of HEP for further gains in strength and function   09/24/15   Time 8   Period Weeks   Status On-going   PT LONG TERM GOAL #2   Title The patient will improved abdominal and trunk extensor strength to 4/5 needed for standing 8 min for grooming and light meal prep   Time 8   Period Weeks   Status On-going   PT LONG TERM GOAL #3   Title The patient will be able to ambulate 30 min needed for recovery from surgery and community ambulation   Time 8   Period Weeks   Status On-going   PT LONG  TERM GOAL #4   Title The patient will have improved hip abduction strength to 4+/5 needed for greater ease with standing and walking longer periods of time.     Time 8   Period Weeks   Status On-going   PT LONG TERM GOAL #5   Title Overall back pain intensity improved by 50% with usual ADLS.     Time 8   Period Weeks   Status On-going   PT LONG TERM GOAL #6   Title FOTO functional outcome score improved from 56% to 48% indicating improved function with less pain   Time 8   Period Weeks   Status On-going               Plan - 08/20/15 1708    Clinical Impression Statement The patient has an increase in right low back pain; therefore exercise modified accordingly.  She is able to do supine core and LE ROM exercises without pain exacerbation.  Good pain relief with pre-mod electrical stimulation.   Patient having breast biopsy next week, will follow up in 1 week.     PT Next Visit Plan may be sore from breast biopsy;  low level core strengthening;  Nu-Step;  assess response to pre-mod       Patient will benefit from skilled therapeutic intervention in order to improve the following deficits and impairments:     Visit Diagnosis: Bilateral low back pain with left-sided sciatica  Muscle weakness (generalized)  Abnormal posture  Other abnormalities of gait and mobility     Problem List Patient Active Problem List   Diagnosis Date Noted  . Breast cancer of upper-inner quadrant of left female breast ([place]) 07/14/2015  . Prediabetes 07/08/2015  . Chronic kidney disease 07/08/2015  . Dependent edema 06/08/2015  . Sleep disturbance 06/08/2015  . Hyperlipidemia 06/08/2015  . Generalized anxiety disorder 06/08/2015  . Essential hypertension 06/08/2015  . Chronic obstructive pulmonary disease ([place]) 06/08/2015  . Gout 06/08/2015  . Acute delirium 05/17/2015  . HCAP (healthcare-associated pneumonia)   . Macrocytosis   . 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 Altered mental  status   .  Sepsis ([place]) 05/15/2015  . Lumbar spondylosis 03/20/2015  . Paresthesias 04/01/2013  . Tremor 03/19/2013    Ruben Im, PT 08/20/2015 5:22 PM Phone: [phone] Fax: [phone] Alvera Singh 08/20/2015, Lajuan Lines PM  [facility] [address] [place], [place], [address] Phone: [phone]   Fax:  [phone]  Name: Brandi [place] MRN: [id number] Date of Birth: August 11, 1945

## 2015-08-24 ENCOUNTER — Ambulatory Visit
Admission: RE | Admit: 2015-08-24 | Discharge: 2015-08-24 | Disposition: A | Discharge status: Home / Self Care | Payer: Commercial Managed Care - HMO | Source: Ambulatory Visit | Attending: General Surgery | Admitting: General Surgery

## 2015-08-24 ENCOUNTER — Other Ambulatory Visit: Payer: Self-pay | Admitting: General Surgery

## 2015-08-24 DIAGNOSIS — N6310 Unspecified lump in the right breast, unspecified quadrant: Secondary | ICD-10-CM

## 2015-08-24 DIAGNOSIS — C50212 Malignant neoplasm of upper-inner quadrant of left female breast: Secondary | ICD-10-CM

## 2015-08-24 HISTORY — PX: BREAST BIOPSY: SHX20

## 2015-08-24 MED ORDER — GADOBENATE DIMEGLUMINE 529 MG/ML IV SOLN
20.0000 mL | Freq: Once | INTRAVENOUS | Status: AC | PRN
  Administered 2015-08-24: 20 mL via INTRAVENOUS

## 2015-08-25 ENCOUNTER — Encounter: Payer: Self-pay | Admitting: Physical Therapy

## 2015-08-25 ENCOUNTER — Other Ambulatory Visit: Payer: Self-pay

## 2015-08-26 ENCOUNTER — Telehealth: Payer: Self-pay | Admitting: Oncology

## 2015-08-26 ENCOUNTER — Ambulatory Visit (HOSPITAL_BASED_OUTPATIENT_CLINIC_OR_DEPARTMENT_OTHER): Payer: Commercial Managed Care - HMO | Admitting: Oncology

## 2015-08-26 ENCOUNTER — Other Ambulatory Visit: Payer: Commercial Managed Care - HMO

## 2015-08-26 VITALS — BP 128/79 | HR 118 | Temp 98.1°F | Resp 18 | Ht 69.0 in | Wt 246.0 lb

## 2015-08-26 DIAGNOSIS — C50212 Malignant neoplasm of upper-inner quadrant of left female breast: Secondary | ICD-10-CM | POA: Diagnosis not present

## 2015-08-26 DIAGNOSIS — D241 Benign neoplasm of right breast: Secondary | ICD-10-CM | POA: Diagnosis not present

## 2015-08-26 DIAGNOSIS — D72829 Elevated white blood cell count, unspecified: Secondary | ICD-10-CM

## 2015-08-26 DIAGNOSIS — R7989 Other specified abnormal findings of blood chemistry: Secondary | ICD-10-CM

## 2015-08-26 DIAGNOSIS — Z862 Personal history of diseases of the blood and blood-forming organs and certain disorders involving the immune mechanism: Secondary | ICD-10-CM

## 2015-08-26 LAB — CBC WITH DIFFERENTIAL/PLATELET
BASOS PCT: 1 %
Basophils Absolute: 138 cells/uL (ref 0–200)
EOS PCT: 2 %
Eosinophils Absolute: 276 cells/uL (ref 15–500)
HCT: 44.5 % (ref 35.0–45.0)
Hemoglobin: 14.6 g/dL (ref 11.7–15.5)
Lymphocytes Relative: 17 %
Lymphs Abs: 2346 cells/uL (ref 850–3900)
MCH: 30.5 pg (ref 27.0–33.0)
MCHC: 32.8 g/dL (ref 32.0–36.0)
MCV: 93.1 fL (ref 80.0–100.0)
MONOS PCT: 6 %
MPV: 10.8 fL (ref 7.5–12.5)
Monocytes Absolute: 828 cells/uL (ref 200–950)
NEUTROS ABS: 10212 {cells}/uL — AB (ref 1500–7800)
Neutrophils Relative %: 74 %
PLATELETS: 313 10*3/uL (ref 140–400)
RBC: 4.78 MIL/uL (ref 3.80–5.10)
RDW: 15.3 % — ABNORMAL HIGH (ref 11.0–15.0)
WBC: 13.8 10*3/uL — ABNORMAL HIGH (ref 4.0–10.5)

## 2015-08-26 LAB — BASIC METABOLIC PANEL
BUN: 25 mg/dL (ref 7–25)
CALCIUM: 9.5 mg/dL (ref 8.6–10.4)
CHLORIDE: 109 mmol/L (ref 98–110)
CO2: 23 mmol/L (ref 20–31)
Creat: 0.92 mg/dL (ref 0.50–0.99)
Glucose, Bld: 131 mg/dL — ABNORMAL HIGH (ref 65–99)
Potassium: 5.1 mmol/L (ref 3.5–5.3)
SODIUM: 141 mmol/L (ref 135–146)

## 2015-08-26 NOTE — Progress Notes (Signed)
[facility]  [phone] Fax:([phone]) [phone]     ID: Brandi Stephens DOB: 07-27-1945  MR#: [id number]  WLN#:[id number]  Patient Care Team: Girtha Rm, NP as PCP - General (Family Medicine) Chauncey Cruel, MD as Consulting Physician (Oncology) Stark Klein, MD as Consulting Physician (General Surgery) Kyung Rudd, MD as Consulting Physician (Radiation Oncology) [name], MD as Consulting Physician (Family Medicine) [name], MD as Consulting Physician (Neurosurgery) Griselda Miner, MD as Consulting Physician (Dermatology) PCP: Harland Dingwall, NP GYN: OTHER MD:  CHIEF COMPLAINT: Estrogen receptor positive breast cancer  CURRENT TREATMENT: Awaiting definitive surgery   BREAST CANCER HISTORY: From the original intake note:  Brandi Stephens started to feel some right breast pain and soreness in March 2017. She brought it to Dr. Lanice Shirts nurse practitioner, Loletha Carrow and since attention, and she was set up for bilateral diagnostic mammography with tomography at the [facility] 07/02/2015 area did the breast density was category C. The patient is status post bilateral reduction mammoplasties. Mammography showed no suspicious masses or calcifications in the right breast, and ultrasonography of the area where she has discomfort shows an area of distortion in the right breast at the 8:00 position 12 cm from the nipple measuring 1.4 cm thought to represent scarring related to the prior reduction surgery, but warranting further evaluation.  Also, in the left breast there was an area of distortion in the upper inner quadrant noted mammographically. Targeted ultrasound evaluating the left breast found no sonographic correlate to the mammographic findings. Ultrasound evaluation of both axillae were benign.  On 07/13/2015 Brandi Stephens underwent biopsy of the right breast area in question which showed only fibrocystic changes with adenosis. Biopsy of the left breast  upper inner quadrant however showed invasive ductal carcinoma, grade 1, estrogen receptor 90% positive, progesterone receptor 70% positive, WITH strong staining intensity, with an MIB-1 of 3%, and no HER-2 amplification, the signals ratio being 0.97 and the number per cell 1.65.  The patient's subsequent history is as detailed below  INTERVAL HISTORY: Brandi Stephens returns today for follow-up of her very early stage breast cancer. Since her last visit here she underwent bilateral breast MRI, on 07/29/2015. This found a breast density to be category C. A0.6 centimeter enhancing mass with slightly irregular margins was noted in the lower central right breast this was felt to possibly represent a small fibroadenoma. There was an area of rim enhancement around the biopsy clip in the upper inner quadrant. This measured up to 1.4 cm and was felt to be postbiopsy change. There were no other abnormal areas and there were no abnormal lymph nodes.  Biopsy of the right breast mass in question 08/24/2015 showed fibroadenoma ([id number]).    REVIEW OF SYSTEMS: She is doing much better now that she is receiving physical therapy. She still has significant pain in her back, right breast, after the biopsy, and her neck. Sometimes her ankles swell. She can be short of breath when climbing stairs. She keeps it cough which is mostly dry. Her appetite is poor. She has occasional problems with nausea. 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 She also has knee pain problems. Walking is difficult. Hot flashes are moderate. Overall she feels better than before. Her insomnia however is if anything worse. She is anxious she says still thinking about her son's death. A detailed review of systems today was otherwise stable disease  PAST MEDICAL HISTORY: Past Medical History  Diagnosis  Date  . Hypertension   . Arthritis   . Hemorrhoids   . PONV (postoperative nausea and vomiting)    . Hypothyroidism   . Breast cancer of lower-outer quadrant of right female breast ([place]) 07/14/2015  . COPD (chronic obstructive pulmonary disease) ([place])   . Anxiety   . Prediabetes     PAST SURGICAL HISTORY: Past Surgical History  Procedure Laterality Date  . Basal cell carcinoma excision  10/15    nose  . Abdominal hysterectomy  1985  . Breast reduction surgery  1998  . Cervical spine surgery  1995    hallo  . Knee arthroscopy Right 2002  . Colectomy  2005    right side  . Hemorrhoid surgery  2008  . Tonsillectomy    . Tubal ligation    . Hand surgery Left   . Eye surgery Left 07/2014    cataract removed 3 months later    FAMILY HISTORY Family History  Problem Relation Age of Onset  . Breast cancer Maternal Aunt   . Lung cancer Maternal Uncle   . Stomach cancer Maternal Grandmother   . Lung cancer Maternal Aunt   . Brain cancer Maternal Aunt   The patient's father died at the age of 45 and her mother at the age of 52. The patient had no brothers, 2 sisters. The patient's mother was diagnosed with uterine cancer at the age of 67. The patient's sister was diagnosed with labial cancer at the age of 54. There is a maternal aunt with a history of breast cancer but the patient does not know at what age she was diagnosed. There is also on the mother's side history of lung cancer brain cancer and stomach cancer.   GYNECOLOGIC HISTORY:  No LMP recorded. Patient has had a hysterectomy.  Menarche age 59 first live birth age 89, the patient is [place] P3. She underwent hysterectomy without salpingo-oophorectomy at age 87. She took hormone replacement only for a few months.   SOCIAL HISTORY:   the patient is a retired Architect. Her husband on is also retired. He used to work for [employer]. Daughter [name] lives in [place] and she is a Wellsite geologist, currently not employed. Daughter [name] lives in [place] and has a Brewing technologist in social work but is not currently working.  Son [name] died at age 48 from causes not clear after autopsy. He had had significant neurologic damage following an accident     ADVANCED DIRECTIVES:  not in place    HEALTH MAINTENANCE: Social History  Substance Use Topics  . Smoking status: Former Smoker -- 1.00 packs/day for 35 years    Types: Cigarettes    Quit date: 03/15/2010  . Smokeless tobacco: Not on file  . Alcohol Use: 0.6 oz/week    1 Glasses of wine per week     Colonoscopy: 2015/[facility]   PAP:  Bone density: 2016  Lipid panel:  Allergies  Allergen Reactions  . Morphine And Related Itching  . Penicillins Other (See Comments)    Childhood reaction. Tolerated ampicillin in Jan 2017 Has patient had a PCN reaction causing immediate rash, facial/tongue/throat swelling, SOB or lightheadedness with hypotension: No Has patient had a PCN reaction causing severe rash involving mucus membranes or skin necrosis: No Has patient had a PCN reaction that required hospitalization No Has patient had a PCN reaction occurring within the last 10 years: No If all of the above answers are "NO", then may proceed with Cephalosporin use.  Current Outpatient Prescriptions  Medication Sig Dispense Refill  . allopurinol (ZYLOPRIM) 100 MG tablet Take 2 tablets (200 mg total) by mouth daily. 90 tablet 1  . atorvastatin (LIPITOR) 20 MG tablet Take 1 tablet (20 mg total) by mouth daily. 90 tablet 1  . fluticasone (FLOVENT HFA) 110 MCG/ACT inhaler Inhale 2 puffs into the lungs 2 (two) times daily. 3 Inhaler 3  . furosemide (LASIX) 20 MG tablet Take 20 mg by mouth at bedtime. Take daily as needed     . KLOR-CON 10 10 MEQ tablet Take 10 mEq by mouth as needed.    Marland Kitchen levothyroxine (SYNTHROID, LEVOTHROID) 75 MCG tablet Take 1 tablet (75 mcg total) by mouth daily. 90 tablet 1  . MAGNESIUM CITRATE PO Take 2 tablets by mouth at bedtime. Reported on 06/08/2015    . rOPINIRole (REQUIP) 0.25 MG tablet Take 1 tablet (0.25 mg total) by mouth at  bedtime. 30 tablet 1  . tiotropium (SPIRIVA) 18 MCG inhalation capsule Place 1 capsule (18 mcg total) into inhaler and inhale daily. 90 capsule 3  . traMADol (ULTRAM) 50 MG tablet Take 1 tablet (50 mg total) by mouth every 6 (six) hours as needed. 90 tablet 0  . valsartan-hydrochlorothiazide (DIOVAN-HCT) 80-12.5 MG tablet Take 1 tablet by mouth daily. 90 tablet 1   No current facility-administered medications for this visit.    OBJECTIVE: Middle-aged white woman In no acute distress   Filed Vitals:   08/26/15 0934  BP: 128/79  Pulse: 118  Temp: 98.1 F (36.7 C)  Resp: 18     Body mass index is 36.31 kg/(m^2).    ECOG FS:1 - Symptomatic but completely ambulatory  Sclerae unicteric, pupils round and equal Oropharynx clear and moist-- no thrush or other lesions No cervical or supraclavicular adenopathy Lungs no rales or rhonchi Heart regular rate and rhythm Abd soft, nontender, positive bowel sounds MSK no focal spinal tenderness to mild palpation ., no upper extremity lymphedema Neuro: nonfocal, well oriented, appropriate affect Breasts: both breasts are status post reduction mammoplasty. In the right breast inferiorly there is a small ecchymosis secondary to the recent biopsy. I do not palpate a mass in either breast. Both axillae are benign  LAB RESULTS:  CMP     Component Value Date/Time   NA 140 08/10/2015 0001   NA 140 07/22/2015 1227   K 5.2 08/10/2015 0001   K 5.1 07/22/2015 1227   CL 106 08/10/2015 0001   CO2 24 08/10/2015 0001   CO2 26 07/22/2015 1227   GLUCOSE 102* 08/10/2015 0001   GLUCOSE 100 07/22/2015 1227   BUN 24 08/10/2015 0001   BUN 25.0 07/22/2015 1227   CREATININE 1.06* 08/10/2015 0001   CREATININE 1.3* 07/22/2015 1227   CREATININE 0.95 05/20/2015 0820   CALCIUM 9.7 08/10/2015 0001   CALCIUM 9.8 07/22/2015 1227   PROT 7.1 07/22/2015 1227   PROT 6.0* 07/07/2015 0001   ALBUMIN 3.6 07/22/2015 1227   ALBUMIN 3.7 07/07/2015 0001   AST 13 07/22/2015  1227   AST 13 07/07/2015 0001   ALT 13 07/22/2015 1227   ALT 12 07/07/2015 0001   ALKPHOS 83 07/22/2015 1227   ALKPHOS 78 07/07/2015 0001   BILITOT 0.30 07/22/2015 1227   BILITOT 0.4 07/07/2015 0001   GFRNONAA >60 05/20/2015 0820   GFRAA >60 05/20/2015 0820    INo results found for: SPEP, UPEP  Lab Results  Component Value Date   WBC 13.4* 08/10/2015   NEUTROABS 9648* 08/10/2015  HGB 15.1 08/10/2015   HCT 45.8* 08/10/2015   MCV 92.5 08/10/2015   PLT 293 08/10/2015      Chemistry      Component Value Date/Time   NA 140 08/10/2015 0001   NA 140 07/22/2015 1227   K 5.2 08/10/2015 0001   K 5.1 07/22/2015 1227   CL 106 08/10/2015 0001   CO2 24 08/10/2015 0001   CO2 26 07/22/2015 1227   BUN 24 08/10/2015 0001   BUN 25.0 07/22/2015 1227   CREATININE 1.06* 08/10/2015 0001   CREATININE 1.3* 07/22/2015 1227   CREATININE 0.95 05/20/2015 0820      Component Value Date/Time   CALCIUM 9.7 08/10/2015 0001   CALCIUM 9.8 07/22/2015 1227   ALKPHOS 83 07/22/2015 1227   ALKPHOS 78 07/07/2015 0001   AST 13 07/22/2015 1227   AST 13 07/07/2015 0001   ALT 13 07/22/2015 1227   ALT 12 07/07/2015 0001   BILITOT 0.30 07/22/2015 1227   BILITOT 0.4 07/07/2015 0001       No results found for: LABCA2  No components found for: LABCA125  No results for input(s): INR in the last 168 hours.  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 Recent ultrasound-guided biopsy of a subtle abnormality in the right breast revealed fibrocystic change with adenosis and calcifications which was considered concordant. The patient has a history of bilateral breast reductions in 1990 as well as a benign left breast excision in 1972. LABS:  Most recent labs 07/22/2015: GFR: 43. Creatinine 1.3 mg/dL. BUN: 25 mg/dL EXAM: BILATERAL BREAST MRI WITH AND WITHOUT CONTRAST TECHNIQUE: Multiplanar, multisequence MR images of both breasts were obtained prior to and following the intravenous administration of 10 ml of MultiHance. THREE-DIMENSIONAL MR IMAGE RENDERING ON INDEPENDENT WORKSTATION: Three-dimensional MR images were rendered by post-processing of the original MR data on an independent workstation. The three-dimensional MR images were interpreted, and findings are reported in the following complete MRI report for this study. Three dimensional images were evaluated at the independent DynaCad workstation COMPARISON:  Previous exam(s). FINDINGS: Breast composition: c.  Heterogeneous fibroglandular tissue. Background parenchymal enhancement: Mild to moderate. Right breast: Few small scattered cysts are seen throughout the right breast. There is a 6 mm enhancing mass with slightly irregular margins in the lower central right breast. This demonstrates increased T2 signal and may represent a small fibroadenoma or intramammary lymph node however is indeterminate. An enhancing lesion within the skin of the lower outer right breast likely represents a sebaceous cyst. Left breast: A biopsy marking clip is present in the upper inner left breast anterior depth at site as biopsy proven malignancy. Small amount of rim enhancement surrounding the biopsy clip and extending slightly anterior medially towards the skin (measuring up to  1.1 x 1.4 x 0.5 cm) is felt to  be related to post biopsy change. No additional areas of abnormal enhancement seen in the left breast to suggest additional sites of disease. Lymph nodes: No morphologically abnormal lymph nodes seen. Ancillary findings:  None. IMPRESSION: 1. Small amount of rim enhancement surrounding the biopsy clip at site of known biopsy proven malignancy in the upper inner left breast is likely related to post biopsy change. No additional suspicious areas of enhancement in the left breast to suggest additional sites of disease. 2. Indeterminate 6 mm enhancing mass in the lower central right breast, possibly a fibroadenoma or intramammary lymph node. RECOMMENDATION: Second-look ultrasound for the indeterminate enhancing mass in the lower central right breast. If this cannot be visualized on ultrasound, then MRI guided biopsy is recommended. BI-RADS CATEGORY  4: Suspicious. Electronically Signed   By: Everlean Alstrom M.D.   On: 07/30/2015 10:39   Mm Digital Diagnostic Unilat R  08/24/2015  CLINICAL DATA:  Status post MRI guided right breast biopsy EXAM: DIAGNOSTIC RIGHT MAMMOGRAM POST MRI BIOPSY COMPARISON:  Previous exam(s). FINDINGS: Mammographic images were obtained following MRI guided biopsy of an indeterminate mass in the lower, outer quadrant of the right breast. Post biopsy mammogram demonstrates the dumbbell-shaped biopsy marker to be slightly medial to the expected location within the lower, outer right breast. IMPRESSION: Post biopsy mammogram demonstrates the dumbbell-shaped biopsy marker to be slightly medial to the expected location within the lower, outer right breast. Final Assessment: Post Procedure Mammograms for Marker Placement Electronically Signed   By: Pamelia Hoit M.D.   On: 08/24/2015 10:01   US Breast Ltd Uni Right Inc Axilla  08/11/2015  CLINICAL DATA:  70 year old female with recently diagnosed grade 1 invasive ductal carcinoma of the left breast. Also a benign biopsy  of the outer right breast revealed fibrocystic change. Recent breast MRI demonstrated an indeterminate 6 mm enhancing mass in the lower central right breast possibly representing a fibroadenoma or intramammary lymph node with second look ultrasound recommended for biopsy planning purposes. Patient reports severe pain in the lower outer right breast which has persisted but demonstrated mild improvement over the past several weeks. EXAM: ULTRASOUND OF THE RIGHT BREAST COMPARISON:  Previous exam(s). FINDINGS: Physical examination of the lower right breast reveals extreme tenderness at the approximate 7 to 8 o'clock position. Targeted ultrasound of the entire inferior right breast was performed. Several clusters of cysts and likely areas of fibrocystic change are identified, however no definite sonographic correlate for the 6 mm enhancing mass seen on recent MRI. Targeted ultrasound at site of extreme pain in the slightly outer right breast reveals a partially thrombosed blood vessel at the 7 o'clock position 5 cm from nipple. IMPRESSION: 1. No definite sonographic correlate for the 6 mm enhancing mass seen on recent breast MRI. 2. Partially thrombosed blood vessel at site of extreme tenderness in the right breast at the 7 o'clock position. RECOMMENDATION: 1. MRI guided biopsy of the enhancing mass in the lower central right breast is recommended. 2. Patient has been instructed to apply warm compress at site of pain/ thrombosed blood vessel in lower outer right breast. I have discussed the findings and recommendations with the patient. Results were also provided in writing at the conclusion of the visit. If applicable, a reminder letter will be sent to the patient regarding the next appointment. BI-RADS CATEGORY  4: Suspicious. Electronically Signed   By: Everlean Alstrom M.D.   On: 08/11/2015 15:46   Mr Rt Breast Bx W Loc Dev 1st Lesion Image  Bx Spec Mr Guide  08/26/2015  ADDENDUM REPORT: 08/25/2015 14:05 ADDENDUM:  Pathology revealed FIBROADENOMA, FIBROCYSTIC CHANGES WITH USUAL DUCTAL HYPERPLASIA of the lower outer quadrant of the right breast. This was found to be concordant by Dr. Franki Cabot. Pathology results were discussed with the patient by telephone. The patient reported doing well after the biopsy with tenderness at the site. Post biopsy instructions and care were reviewed and questions were answered. The patient was encouraged to call [facility] for any additional concerns. The patient has a recent diagnosis of left breast cancer and should follow her outlined treatment plan. A 6 month follow up MRI is also recommended. Pathology results reported by Terie Purser, RN on 08/25/2015. Electronically Signed   By: Pamelia Hoit M.D.   On: 08/25/2015 14:05  08/26/2015  CLINICAL DATA:  70 year old female for MRI guided biopsy of an indeterminate right breast mass. EXAM: MRI GUIDED CORE NEEDLE BIOPSY OF THE RIGHT BREAST TECHNIQUE: Multiplanar, multisequence MR imaging of the right breast was performed both before and after administration of intravenous contrast. CONTRAST:  6m MULTIHANCE GADOBENATE DIMEGLUMINE 529 MG/ML IV SOLN COMPARISON:  Previous exams. FINDINGS: I met with the patient, and we discussed the procedure of MRI guided biopsy, including risks, benefits, and alternatives. Specifically, we discussed the risks of infection, bleeding, tissue injury, clip migration, and inadequate sampling. Informed, written consent was given. The usual time out protocol was performed immediately prior to the procedure. Using sterile technique, 1% Lidocaine, MRI guidance, and a 9 gauge vacuum assisted device, biopsy was performed of an indeterminate mass in the lower, outer quadrant of the right breast using a lateral to medial approach. At the conclusion of the procedure, a dumbbell-shaped tissue marker clip was deployed into the biopsy cavity. Follow-up 2-view mammogram was performed and dictated  separately. IMPRESSION: MRI guided biopsy of an indeterminate right breast mass. No apparent complications. Electronically Signed: By: EPamelia HoitM.D. On: 08/24/2015 09:57    ASSESSMENT: 70y.o. [place] woman  Status post left breast biopsy 07/13/2015 for a clinical [place] N0 invasive ductal carcinoma, grade 1, estrogen and progesterone receptor positive, HER-2 nonamplified, with an MIB-1 of 3%.  (a) biopsy of a suspicious lesion in the right breast proved to be a small fibroadenoma  (1) breast conserving surgery planned  (2) Oncotype to be obtained if the tumor proves to be larger than 5 mm.  (3) Adjuvant radiation to follow  (4) anti-estrogens to follow completion of local treatment  PLAN: Brandi Stephens is feeling much more confident now that she knows the right breast problem is not malignant. She is ready to proceed to her left lumpectomy. I have sent Dr. BBarry Dienesat note letting her know the patient is now ready.  I do not anticipate chemotherapy in this case. The original tumor appears to be very small, possibly less than 5 mm, but in any case strongly estrogen and progesterone receptor positive with a very low ejection fraction. These tumors, luminal a subtype, generally do not benefit from chemotherapy. All less we have a surprise from the final biopsy the [facility] after surgery.  Accordingly she will see me again in August. We will discuss anti-estrogens in detail at that visit and the plan will be for her to be on an antiestrogen for 5 years  She knows to call for any problems that may develop before the next visit here.  MChauncey Cruel MD   08/26/2015 9:53  AM Medical Oncology and Hematology [facility] [address] [place], [place] [address] Tel. [phone]    Fax. [phone]

## 2015-08-26 NOTE — Telephone Encounter (Signed)
appt made and avs printed °

## 2015-08-27 ENCOUNTER — Ambulatory Visit (INDEPENDENT_AMBULATORY_CARE_PROVIDER_SITE_OTHER): Payer: Commercial Managed Care - HMO | Admitting: Psychiatry

## 2015-08-27 ENCOUNTER — Other Ambulatory Visit: Payer: Self-pay | Admitting: General Surgery

## 2015-08-27 DIAGNOSIS — C50012 Malignant neoplasm of nipple and areola, left female breast: Secondary | ICD-10-CM

## 2015-08-27 DIAGNOSIS — F4323 Adjustment disorder with mixed anxiety and depressed mood: Secondary | ICD-10-CM | POA: Diagnosis not present

## 2015-08-27 DIAGNOSIS — C50912 Malignant neoplasm of unspecified site of left female breast: Secondary | ICD-10-CM

## 2015-08-28 ENCOUNTER — Ambulatory Visit: Payer: Commercial Managed Care - HMO | Admitting: Physical Therapy

## 2015-08-28 ENCOUNTER — Encounter: Payer: Self-pay | Admitting: Physical Therapy

## 2015-08-28 DIAGNOSIS — M5442 Lumbago with sciatica, left side: Secondary | ICD-10-CM | POA: Diagnosis not present

## 2015-08-28 DIAGNOSIS — R2689 Other abnormalities of gait and mobility: Secondary | ICD-10-CM

## 2015-08-28 DIAGNOSIS — R293 Abnormal posture: Secondary | ICD-10-CM

## 2015-08-28 DIAGNOSIS — M6281 Muscle weakness (generalized): Secondary | ICD-10-CM

## 2015-08-28 NOTE — Therapy (Signed)
[facility] [address] [place], [place], [address] Phone: [phone]   Fax:  [phone]  Physical Therapy Treatment  Patient Details  Name: Brandi Stephens MRN: [id number] Date of Birth: Feb 25, 1946 Referring Provider:  Dr. Redmond School ; Mack Hook NP  Encounter Date: 08/28/2015      PT End of Session - 08/28/15 1109    Visit Number 7   Number of Visits 10   Date for PT Re-Evaluation 30-Sep-2015   Authorization Type G codes; KX at visit 15   PT Start Time 1102   PT Stop Time 1144   PT Time Calculation (min) 42 min   Activity Tolerance Patient tolerated treatment well   Behavior During Therapy [facility] for tasks assessed/performed      Past Medical History  Diagnosis Date  . Hypertension   . Arthritis   . Hemorrhoids   . PONV (postoperative nausea and vomiting)   . Hypothyroidism   . Breast cancer of lower-outer quadrant of right female breast ([place]) 07/14/2015  . COPD (chronic obstructive pulmonary disease) ([place])   . Anxiety   . Prediabetes     Past Surgical History  Procedure Laterality Date  . Basal cell carcinoma excision  10/15    nose  . Abdominal hysterectomy  1985  . Breast reduction surgery  1998  . Cervical spine surgery  1995    hallo  . Knee arthroscopy Right 2002  . Colectomy  2005    right side  . Hemorrhoid surgery  2008  . Tonsillectomy    . Tubal ligation    . Hand surgery Left   . Eye surgery Left 07/2014    cataract removed 3 months later    There were no vitals filed for this visit.      Subjective Assessment - 08/28/15 1109    Subjective Biopsy for RT BC was beginin. She reports having a lot of constant pain in her back. Walking and standing for distances/longer times can increase pain.    Currently in Pain? Yes   Pain Score 7    Pain Location Back   Pain Orientation Right;Left;Lower   Aggravating Factors  Wlaking and standing   Pain Relieving Factors Laying   Multiple  Pain Sites No                         [facility] Adult PT Treatment/Exercise - 08/28/15 0001    Lumbar Exercises: Stretches   Active Hamstring Stretch 3 reps;20 seconds   Single Knee to Chest Stretch 3 reps;20 seconds   Lower Trunk Rotation 5 reps   Lumbar Exercises: Standing   Other Standing Lumbar Exercises weight shifting on mini tramp 1 min 3 ways   Lumbar Exercises: Seated   Other Seated Lumbar Exercises Sitting on blue ball: red band horizontal abd 2x 10 red   Lumbar Exercises: Supine   Ab Set 20 reps  Adductor/gluteal/TA contraction 20x   Bent Knee Raise 20 reps   Knee/Hip Exercises: Standing   Hip Abduction Stengthening;Both;2 sets;10 reps   Forward Step Up Both;Hand Hold: 2;Step Height: 6";20 reps                  PT Short Term Goals - 08/28/15 1117    PT SHORT TERM GOAL #3   Title The patient will be able to ambulate 600 feet in 6 min needed for short distance community ambulation   Period Weeks   Status Achieved  PT SHORT TERM GOAL #4   Title The patient will report an improvement in pain by 25% with usual ADLS   Time 4   Period Weeks   Status On-going  Pt reports not this week.            PT Long Term Goals - 08/20/15 1719    PT LONG TERM GOAL #1   Title The patient will be independent in safe, self progression of HEP for further gains in strength and function   09/24/15   Time 8   Period Weeks   Status On-going   PT LONG TERM GOAL #2   Title The patient will improved abdominal and trunk extensor strength to 4/5 needed for standing 8 min for grooming and light meal prep   Time 8   Period Weeks   Status On-going   PT LONG TERM GOAL #3   Title The patient will be able to ambulate 30 min needed for recovery from surgery and community ambulation   Time 8   Period Weeks   Status On-going   PT LONG TERM GOAL #4   Title The patient will have improved hip abduction strength to 4+/5 needed for greater ease with standing and walking  longer periods of time.     Time 8   Period Weeks   Status On-going   PT LONG TERM GOAL #5   Title Overall back pain intensity improved by 50% with usual ADLS.     Time 8   Period Weeks   Status On-going   PT LONG TERM GOAL #6   Title FOTO functional outcome score improved from 56% to 48% indicating improved function with less pain   Time 8   Period Weeks   Status On-going         Breast Clinic Goals - 07/22/15 1710    Patient will be able to verbalize understanding of pertinent lymphedema risk reduction practices relevant to her diagnosis specifically related to skin care.   Time 1   Period Days   Status Achieved   Patient will be able to return demonstrate and/or verbalize understanding of the post-op home exercise program related to regaining shoulder range of motion.   Time 1   Period Days   Status Achieved   Patient will be able to verbalize understanding of the importance of attending the postoperative After Breast Cancer Class for further lymphedema risk reduction education and therapeutic exercise.   Time 1   Period Days   Status Achieved              Plan - 08/28/15 1115    Clinical Impression Statement Pain still limiting but pt has had a lot of extra tests for her Breast CAncer. This may have made the factor in regards to increased  pain.    Rehab Potential Good   Clinical Impairments Affecting Rehab Potential Breast CA no U/S;  lumbar fusion Dec '16   PT Frequency 2x / week   PT Duration 8 weeks   PT Treatment/Interventions ADLs/Self Care Home Management;Cryotherapy;Electrical Stimulation;Moist Heat;Therapeutic exercise;Patient/family education;Neuromuscular re-education;Manual techniques;Taping      Patient will benefit from skilled therapeutic intervention in order to improve the following deficits and impairments:  Decreased activity tolerance, Decreased endurance, Decreased range of motion, Decreased strength, Difficulty walking, Pain  Visit  Diagnosis: Bilateral low back pain with left-sided sciatica  Muscle weakness (generalized)  Abnormal posture  Other abnormalities of gait and mobility     Problem List Patient Active  Problem List   Diagnosis Date Noted  . Breast cancer of upper-inner quadrant of left female breast ([place]) 07/14/2015  . Prediabetes 07/08/2015  . Chronic kidney disease 07/08/2015  . Dependent edema 06/08/2015  . Sleep disturbance 06/08/2015  . Hyperlipidemia 06/08/2015  . Generalized anxiety disorder 06/08/2015  . Essential hypertension 06/08/2015  . Chronic obstructive pulmonary disease ([place]) 06/08/2015  . Gout 06/08/2015  . Acute delirium 05/17/2015  . HCAP (healthcare-associated pneumonia)   . Macrocytosis   . 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 Altered mental status   . Sepsis ([place]) 05/15/2015  . Lumbar spondylosis 03/20/2015  . Paresthesias 04/01/2013  . Tremor 03/19/2013    Brandi Stephens, PTA 08/28/2015, 11:46 AM  [facility] [address], [place], [place], [address] Phone: [phone]   Fax:  [phone]  Name: Brandi Stephens MRN: [id number] Date of Birth: Mar 22, 1946

## 2015-08-31 ENCOUNTER — Ambulatory Visit: Payer: Commercial Managed Care - HMO | Admitting: Physical Therapy

## 2015-08-31 ENCOUNTER — Encounter: Payer: Self-pay | Admitting: Physical Therapy

## 2015-08-31 ENCOUNTER — Telehealth: Payer: Self-pay | Admitting: Internal Medicine

## 2015-08-31 DIAGNOSIS — R293 Abnormal posture: Secondary | ICD-10-CM

## 2015-08-31 DIAGNOSIS — M5442 Lumbago with sciatica, left side: Secondary | ICD-10-CM

## 2015-08-31 DIAGNOSIS — M6281 Muscle weakness (generalized): Secondary | ICD-10-CM

## 2015-08-31 NOTE — Therapy (Signed)
[facility] [address] [place], [place], [address] Phone: [phone]   Fax:  [phone]  Physical Therapy Treatment  Patient Details  Name: Brandi Stephens MRN: [id number] Date of Birth: 1945/08/21 Referring Provider:  Dr. Redmond School ; Mack Hook NP  Encounter Date: 08/31/2015      PT End of Session - 08/31/15 1407    Visit Number 8   Number of Visits 10   Date for PT Re-Evaluation 10-16-2015   Authorization Type G codes; KX at visit 15   PT Start Time 1406   PT Stop Time 1505   PT Time Calculation (min) 59 min   Activity Tolerance Patient tolerated treatment well   Behavior During Therapy [facility] for tasks assessed/performed      Past Medical History  Diagnosis Date  . Hypertension   . Arthritis   . Hemorrhoids   . PONV (postoperative nausea and vomiting)   . Hypothyroidism   . Breast cancer of lower-outer quadrant of right female breast ([place]) 07/14/2015  . COPD (chronic obstructive pulmonary disease) ([place])   . Anxiety   . Prediabetes     Past Surgical History  Procedure Laterality Date  . Basal cell carcinoma excision  10/15    nose  . Abdominal hysterectomy  1985  . Breast reduction surgery  1998  . Cervical spine surgery  1995    hallo  . Knee arthroscopy Right 2002  . Colectomy  2005    right side  . Hemorrhoid surgery  2008  . Tonsillectomy    . Tubal ligation    . Hand surgery Left   . Eye surgery Left 07/2014    cataract removed 3 months later    There were no vitals filed for this visit.      Subjective Assessment - 08/31/15 1411    Subjective Pain has been worse over the weekend. Not sure why....swelling in LT foot.    Currently in Pain? Yes   Pain Score 8    Pain Location Back  Nerve pain down left leg   Pain Orientation Right   Aggravating Factors  Overdoing maybe?   Pain Relieving Factors Laying flat   Multiple Pain Sites No                         [facility]  Adult PT Treatment/Exercise - 08/31/15 0001    Lumbar Exercises: Stretches   Single Knee to Chest Stretch 3 reps;20 seconds   Pelvic Tilt --  With adductor squeeze: 10x   Lumbar Exercises: Standing   Other Standing Lumbar Exercises weight shifting on mini tramp 1 min 3 ways   Lumbar Exercises: Seated   Long Arc Quad on Chair Strengthening;Both;2 sets;10 reps   Other Seated Lumbar Exercises Sitting on blue ball: red band horizontal abd 2x 10 red   Lumbar Exercises: Supine   Ab Set 20 reps  Adductor/gluteal/TA contraction 20x   Bent Knee Raise 20 reps   Other Supine Lumbar Exercises Bil arm circles keeping trunk stable 10 each direction   Knee/Hip Exercises: Standing   Hip Abduction Stengthening;Both;2 sets;10 reps  1# added   Hip Extension AAROM;Both;2 sets;10 reps;Knee straight  1# added   Knee/Hip Exercises: Sidelying   Clams Red band 10 x Bil  VC to engage TA  during exercise   Moist Heat Therapy   Number Minutes Moist Heat 15 Minutes   Moist Heat Location Lumbar Spine   Electrical Stimulation  Psychologist, forensic IFC   Electrical Stimulation Goals Pain                  PT Short Term Goals - 08/28/15 1117    PT SHORT TERM GOAL #3   Title The patient will be able to ambulate 600 feet in 6 min needed for short distance community ambulation   Period Weeks   Status Achieved   PT SHORT TERM GOAL #4   Title The patient will report an improvement in pain by 25% with usual ADLS   Time 4   Period Weeks   Status On-going  Pt reports not this week.            PT Long Term Goals - 08/31/15 1424    PT LONG TERM GOAL #1   Title The patient will be independent in safe, self progression of HEP for further gains in strength and function   09/24/15   Time 8   Period Weeks   Status On-going   PT LONG TERM GOAL #2   Title The patient will improved abdominal and trunk extensor strength to 4/5 needed for standing 8 min  for grooming and light meal prep   Time 8   Period Weeks   Status On-going   PT LONG TERM GOAL #3   Title The patient will be able to ambulate 30 min needed for recovery from surgery and community ambulation   Time 8   Period Weeks   Status --  10 -15 min   PT LONG TERM GOAL #6   Title FOTO functional outcome score improved from 56% to 48% indicating improved function with less pain   Time 8   Period Weeks   Status On-going         Breast Clinic Goals - 07/22/15 1710    Patient will be able to verbalize understanding of pertinent lymphedema risk reduction practices relevant to her diagnosis specifically related to skin care.   Time 1   Period Days   Status Achieved   Patient will be able to return demonstrate and/or verbalize understanding of the post-op home exercise program related to regaining shoulder range of motion.   Time 1   Period Days   Status Achieved   Patient will be able to verbalize understanding of the importance of attending the postoperative After Breast Cancer Class for further lymphedema risk reduction education and therapeutic exercise.   Time 1   Period Days   Status Achieved              Plan - 08/31/15 1409    Clinical Impression Statement Pt has experienced more pain over the weekend, she is unsure why. She is still able to complete her exercises without increasing pain. Estim helps.    Rehab Potential Good   Clinical Impairments Affecting Rehab Potential Breast CA no U/S;  lumbar fusion Dec '16   PT Frequency 2x / week   PT Duration 8 weeks   PT Treatment/Interventions ADLs/Self Care Home Management;Cryotherapy;Electrical Stimulation;Moist Heat;Therapeutic exercise;Patient/family education;Neuromuscular re-education;Manual techniques;Taping   PT Next Visit Plan Core strength, standing core & hip strength & flexibility.    Consulted and Agree with Plan of Care Patient      Patient will benefit from skilled therapeutic intervention in order  to improve the following deficits and impairments:  Decreased activity tolerance, Decreased endurance, Decreased range of motion, Decreased strength, Difficulty walking, Pain  Visit Diagnosis: Bilateral low back  pain with left-sided sciatica  Muscle weakness (generalized)  Abnormal posture     Problem List Patient Active Problem List   Diagnosis Date Noted  . Breast cancer of upper-inner quadrant of left female breast ([place]) 07/14/2015  . Prediabetes 07/08/2015  . Chronic kidney disease 07/08/2015  . Dependent edema 06/08/2015  . Sleep disturbance 06/08/2015  . Hyperlipidemia 06/08/2015  . Generalized anxiety disorder 06/08/2015  . Essential hypertension 06/08/2015  . Chronic obstructive pulmonary disease ([place]) 06/08/2015  . Gout 06/08/2015  . Acute delirium 05/17/2015  . HCAP (healthcare-associated pneumonia)   . Macrocytosis   . 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 Altered mental status   . Sepsis ([place]) 05/15/2015  . Lumbar spondylosis 03/20/2015  . Paresthesias 04/01/2013  . Tremor 03/19/2013    Aarti Mankowski, PTA 08/31/2015, 2:53 PM  [facility] [address] [place], [place], [address] Phone: [phone]   Fax:  [phone]  Name: Brandi Stephens MRN: [id number] Date of Birth: 05-19-1945

## 2015-08-31 NOTE — Telephone Encounter (Signed)
Pt calling to get form filled out for handicap placard for her back pain. Sees Vickie. Pt was advised that she is out of the office, but that we would check to see if someone would complete form for her. Form is in your folder.

## 2015-09-01 NOTE — Telephone Encounter (Signed)
Pt was notified that placard was ready

## 2015-09-02 ENCOUNTER — Other Ambulatory Visit: Payer: Self-pay | Admitting: General Surgery

## 2015-09-02 ENCOUNTER — Encounter: Payer: Self-pay | Admitting: Physical Therapy

## 2015-09-02 DIAGNOSIS — C50912 Malignant neoplasm of unspecified site of left female breast: Secondary | ICD-10-CM

## 2015-09-03 ENCOUNTER — Ambulatory Visit (INDEPENDENT_AMBULATORY_CARE_PROVIDER_SITE_OTHER): Payer: Commercial Managed Care - HMO | Admitting: Psychiatry

## 2015-09-03 DIAGNOSIS — F4323 Adjustment disorder with mixed anxiety and depressed mood: Secondary | ICD-10-CM

## 2015-09-08 ENCOUNTER — Ambulatory Visit: Payer: Commercial Managed Care - HMO | Admitting: Physical Therapy

## 2015-09-08 DIAGNOSIS — M5442 Lumbago with sciatica, left side: Secondary | ICD-10-CM | POA: Diagnosis not present

## 2015-09-08 DIAGNOSIS — M6281 Muscle weakness (generalized): Secondary | ICD-10-CM

## 2015-09-08 DIAGNOSIS — R2689 Other abnormalities of gait and mobility: Secondary | ICD-10-CM

## 2015-09-08 DIAGNOSIS — R293 Abnormal posture: Secondary | ICD-10-CM

## 2015-09-08 NOTE — Therapy (Signed)
[facility] [address] [place], [place], [address] Phone: [phone]   Fax:  [phone]  Physical Therapy Treatment  Patient Details  Name: Brandi Stephens MRN: [id number] Date of Birth: [birth year] Referring Provider:  Dr. Redmond School ; Mack Hook NP  Encounter Date: 09/08/2015      PT End of Session - 09/08/15 1700    Visit Number 9   Number of Visits 10   Date for PT Re-Evaluation 10/04/2015   Authorization Type G codes; KX at visit 15   PT Start Time 1615   PT Stop Time 1702   PT Time Calculation (min) 47 min   Activity Tolerance Patient tolerated treatment well      Past Medical History  Diagnosis Date  . Hypertension   . Arthritis   . Hemorrhoids   . PONV (postoperative nausea and vomiting)   . Hypothyroidism   . Breast cancer of lower-outer quadrant of right female breast ([place]) 07/14/2015  . COPD (chronic obstructive pulmonary disease) ([place])   . Anxiety   . Prediabetes     Past Surgical History  Procedure Laterality Date  . Basal cell carcinoma excision  10/15    nose  . Abdominal hysterectomy  1985  . Breast reduction surgery  1998  . Cervical spine surgery  1995    hallo  . Knee arthroscopy Right 2002  . Colectomy  2005    right side  . Hemorrhoid surgery  2008  . Tonsillectomy    . Tubal ligation    . Hand surgery Left   . Eye surgery Left 07/2014    cataract removed 3 months later    There were no vitals filed for this visit.      Subjective Assessment - 09/08/15 1617    Subjective  Having surgery on 5/31 lumpectomy left breast and lymph node removal.  Right breast tests came back OK.  Back pain stays about a 5 or 6.  Left leg nerve pain is getting worse.  I can only walk a little bit in the neighborhood but pushing shopping  in [employer] 1 hour.     Currently in Pain? Yes   Pain Score 6    Pain Location Back   Aggravating Factors  standing, walking, getting up off the floor.                            [place] Adult PT Treatment/Exercise - 09/08/15 0001    Lumbar Exercises: Aerobic   Stationary Bike Nu-step L1 8 min   Lumbar Exercises: Seated   Sit to Stand 20 reps  with ball squeeze and green band clam    Lumbar Exercises: Supine   Ab Set 20 reps  Adductor/gluteal/TA contraction 20x   Straight Leg Raise 10 reps  hip extension isometric 10x right/left   Isometric Hip Flexion 10 reps   Large Ball Abdominal Isometric Limitations UE extension isometric 15x   Other Supine Lumbar Exercises LEs over red ball with knee extension/ankle DF 15x each side   Other Supine Lumbar Exercises Bridge over ball    Knee/Hip Exercises: Standing   Heel Raises Both;1 set;10 reps   Hip Abduction AROM;Right;Left;1 set;10 reps   Other Standing Knee Exercises weight shifting 4 ways   Knee/Hip Exercises: Seated   Heel Slides Right;Left  ankle DF with red band 20x   Other Seated Knee/Hip Exercises rocker board 25x  PT Short Term Goals - 09/08/15 1708    PT SHORT TERM GOAL #1   Title The patient will be able to perform initial HEP for early core muscle activation and strengthening   08/27/15   Status Achieved   PT SHORT TERM GOAL #2   Title The patient will be able to stand for 5 min needed for grooming and light meal prep   Status Achieved   PT SHORT TERM GOAL #3   Title The patient will be able to ambulate 600 feet in 6 min needed for short distance community ambulation   Status Achieved   PT SHORT TERM GOAL #4   Title The patient will report an improvement in pain by 25% with usual ADLS   Time 4   Period Weeks   Status On-going           PT Long Term Goals - 09/08/15 1708    PT LONG TERM GOAL #1   Title The patient will be independent in safe, self progression of HEP for further gains in strength and function   09/24/15   Time 8   Period Weeks   Status On-going   PT LONG TERM GOAL #2   Title The patient will improved abdominal  and trunk extensor strength to 4/5 needed for standing 8 min for grooming and light meal prep   Time 8   Period Weeks   Status On-going   PT LONG TERM GOAL #3   Title The patient will be able to ambulate 30 min needed for recovery from surgery and community ambulation   Time 8   Period Weeks   Status On-going   PT LONG TERM GOAL #4   Title The patient will have improved hip abduction strength to 4+/5 needed for greater ease with standing and walking longer periods of time.     Time 8   Period Weeks   Status On-going   PT LONG TERM GOAL #5   Title Overall back pain intensity improved by 50% with usual ADLS.     Time 8   Period Weeks   Status On-going   PT LONG TERM GOAL #6   Title FOTO functional outcome score improved from 56% to 48% indicating improved function with less pain   Time 8   Period Weeks   Status On-going               Plan - 09/08/15 1701    Clinical Impression Statement The patient is able to participate in low to moderate level core and LE strengthening in supine and sitting.  In standing, her back pain is increased within 2-3 min of standing.  Therapist closely monitoring pain and modifying often.  Progress is slowed by multiple co-morbidities.  Will hold PT after next visit secondary to patient having lumpectomy.     PT Next Visit Plan Core strength, standing core & hip strength & flexibility.  Hold PT after next treatment secondary to upcoming surgery;  G code;  FOTO      Patient will benefit from skilled therapeutic intervention in order to improve the following deficits and impairments:     Visit Diagnosis: Bilateral low back pain with left-sided sciatica  Muscle weakness (generalized)  Abnormal posture  Other abnormalities of gait and mobility     Problem List Patient Active Problem List   Diagnosis Date Noted  . Breast cancer of upper-inner quadrant of left female breast ([place]) 07/14/2015  . Prediabetes 07/08/2015  . Chronic kidney  disease 07/08/2015  . Dependent edema 06/08/2015  . Sleep disturbance 06/08/2015  . Hyperlipidemia 06/08/2015  . Generalized anxiety disorder 06/08/2015  . Essential hypertension 06/08/2015  . Chronic obstructive pulmonary disease ([place]) 06/08/2015  . Gout 06/08/2015  . Acute delirium 05/17/2015  . HCAP (healthcare-associated pneumonia)   . Macrocytosis   . 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 Altered mental status   . Sepsis ([place]) 05/15/2015  . Lumbar spondylosis 03/20/2015  . Paresthesias 04/01/2013  . Tremor 03/19/2013    Ruben Im, PT 09/08/2015 5:10 PM Phone: [phone] Fax: [phone]  Alvera Singh 09/08/2015, 5:09 PM  [facility] [address], [place], [place], [address] Phone: [phone]   Fax:  [phone]  Name: Brandi Stephens MRN: [id number] Date of Birth: [birth year]

## 2015-09-09 ENCOUNTER — Other Ambulatory Visit: Payer: Self-pay | Admitting: Internal Medicine

## 2015-09-09 DIAGNOSIS — G2581 Restless legs syndrome: Secondary | ICD-10-CM

## 2015-09-09 MED ORDER — VALSARTAN-HYDROCHLOROTHIAZIDE 320-12.5 MG PO TABS: 1.0000 | ORAL_TABLET | Freq: Every day | ORAL | Status: DC

## 2015-09-09 MED ORDER — PRIMIDONE 50 MG PO TABS: 25.0000 mg | ORAL_TABLET | Freq: Every day | ORAL | Status: DC

## 2015-09-09 MED ORDER — ROPINIROLE HCL 0.25 MG PO TABS: 0.2500 mg | ORAL_TABLET | Freq: Every day | ORAL | Status: DC

## 2015-09-09 NOTE — Telephone Encounter (Signed)
Dr. Arneta Cliche called and needs Primidone 50mg  (which she takes 1/2 at bedtime daily) and needs requip med sent to pharmacy. Her neurologist was refilling her primidone but he has moved away and she still had refills since coming to see vickie the first time. Now that she is running out she needs a refill. Is it okay to refill both to [facility]. (meds that are needing refilled are pending approval)    Note to me (sabrina): pt called to state that we sent in wrong bp med and it was suppose to be 320-12.5 and not 80-12.5 of diovan. I resent new rx to local pharmacy and then sent 90 days to [facility] and called [facility] and cancelled the 80-12.5mg  rx. Pt also wanted to know if she is on auto refill and [employer] states that because she medicare she has to call in to them everytime her 90 days is almost up to get refill from them. (ALREADY DONE BY SABRINA)

## 2015-09-10 ENCOUNTER — Other Ambulatory Visit: Payer: Commercial Managed Care - HMO

## 2015-09-10 ENCOUNTER — Ambulatory Visit: Payer: Commercial Managed Care - HMO | Admitting: Physical Therapy

## 2015-09-10 DIAGNOSIS — M5442 Lumbago with sciatica, left side: Secondary | ICD-10-CM | POA: Diagnosis not present

## 2015-09-10 DIAGNOSIS — M6281 Muscle weakness (generalized): Secondary | ICD-10-CM

## 2015-09-10 DIAGNOSIS — M1000 Idiopathic gout, unspecified site: Secondary | ICD-10-CM

## 2015-09-10 DIAGNOSIS — R293 Abnormal posture: Secondary | ICD-10-CM

## 2015-09-10 DIAGNOSIS — R2689 Other abnormalities of gait and mobility: Secondary | ICD-10-CM

## 2015-09-10 LAB — URIC ACID: URIC ACID, SERUM: 6.6 mg/dL (ref 2.4–7.0)

## 2015-09-10 NOTE — Therapy (Addendum)
[facility] [address] [place], [place], [address] Phone: [phone]   Fax:  [phone]  Physical Therapy Treatment/Discharge Summary  Patient Details  Name: Brandi Stephens MRN: [id number] Date of Birth: 07/22/1945 Referring Provider:  Dr. Redmond School ; Mack Hook NP  Encounter Date: 09/10/2015      PT End of Session - 09/10/15 1311    Visit Number 10   Number of Visits 10   Date for PT Re-Evaluation 24-Oct-2015   Authorization Type G codes; KX at visit 15   PT Start Time 1225   PT Stop Time 1307   PT Time Calculation (min) 42 min   Activity Tolerance Patient tolerated treatment well      Past Medical History  Diagnosis Date  . Hypertension   . Arthritis   . Hemorrhoids   . PONV (postoperative nausea and vomiting)   . Hypothyroidism   . Breast cancer of lower-outer quadrant of right female breast ([place]) 07/14/2015  . COPD (chronic obstructive pulmonary disease) ([place])   . Anxiety   . Prediabetes     Past Surgical History  Procedure Laterality Date  . Basal cell carcinoma excision  10/15    nose  . Abdominal hysterectomy  1985  . Breast reduction surgery  1998  . Cervical spine surgery  1995    hallo  . Knee arthroscopy Right 2002  . Colectomy  2005    right side  . Hemorrhoid surgery  2008  . Tonsillectomy    . Tubal ligation    . Hand surgery Left   . Eye surgery Left 07/2014    cataract removed 3 months later    There were no vitals filed for this visit.      Subjective Assessment - 09/10/15 1232    Subjective 5/10 back pain today.  Tight in my back and right breast.  Left LE nerve pain persists.  Just had bloodwork, had to fast but I ate an apple.   Currently in Pain? Yes   Pain Score 5    Pain Location Back   Pain Orientation Right   Pain Type Chronic pain   Pain Onset More than a month ago   Pain Frequency Constant            [facility] PT Assessment - 09/10/15 0001    Observation/Other Assessments   Focus on Therapeutic Outcomes (FOTO)  51% limitation                     [facility] Adult PT Treatment/Exercise - 09/10/15 0001    Lumbar Exercises: Stretches   Active Hamstring Stretch 5 reps  on 2nd stair step   Quad Stretch 5 reps  hip flexor on step   Lumbar Exercises: Aerobic   Stationary Bike Nu-Step L 1 10   Lumbar Exercises: Seated   Long Arc Quad on Chair Strengthening;Both;2 sets;10 reps  flossing with ankle DF   Other Seated Lumbar Exercises Presses down on chair 10x    Lumbar Exercises: Supine   Ab Set 15 reps   Straight Leg Raise 10 reps  hip extension isometric 10x right/left   Large Ball Abdominal Isometric Limitations UE extension isometric 15x   Other Supine Lumbar Exercises ab brace with ball squeeze 10x   Other Supine Lumbar Exercises ab brace with red band clams single and double leg 20x   Knee/Hip Exercises: Standing   Heel Raises Both;1 set;10 reps   Hip Abduction AROM;Right;Left;1 set;10 reps  green band   Hip Extension Stengthening;Right;Left;1 set;10 reps  green band   Knee/Hip Exercises: Seated   Sit to Sand 1 set;10 reps;without UE support  "potty squats" hover                  PT Short Term Goals - 09/10/15 1712    PT SHORT TERM GOAL #1   Title The patient will be able to perform initial HEP for early core muscle activation and strengthening   08/27/15   Status Achieved   PT SHORT TERM GOAL #2   Title The patient will be able to stand for 5 min needed for grooming and light meal prep   Status Achieved   PT SHORT TERM GOAL #3   Title The patient will be able to ambulate 600 feet in 6 min needed for short distance community ambulation   Status Achieved   PT SHORT TERM GOAL #4   Title The patient will report an improvement in pain by 25% with usual ADLS   Time 4   Period Weeks   Status On-going           PT Long Term Goals - 09/10/15 1712    PT LONG TERM GOAL #1   Title The patient will  be independent in safe, self progression of HEP for further gains in strength and function   09/24/15   Time 8   Period Weeks   Status On-going   PT LONG TERM GOAL #2   Title The patient will improved abdominal and trunk extensor strength to 4/5 needed for standing 8 min for grooming and light meal prep   Time 8   Period Weeks   Status On-going   PT LONG TERM GOAL #3   Title The patient will be able to ambulate 30 min needed for recovery from surgery and community ambulation   Time 8   Period Weeks   Status On-going   PT LONG TERM GOAL #4   Title The patient will have improved hip abduction strength to 4+/5 needed for greater ease with standing and walking longer periods of time.     Time 8   Period Weeks   Status On-going   PT LONG TERM GOAL #5   Title Overall back pain intensity improved by 50% with usual ADLS.     Time 8   Period Weeks   Status On-going   PT LONG TERM GOAL #6   Title FOTO functional outcome score improved from 56% to 48% indicating improved function with less pain   Time 8   Period Weeks   Status On-going               Plan - 09/10/15 1707    Clinical Impression Statement The patient continues to be limited in standing tolerance time secondary to LBP and "shakiness".  She is able to particpate in supine and sitting ex with minimal discomfort.    Patient has been fasting this morning for bloodwork which may be limiting her ex tolerance as well.   Therapist closely monitoring response throughout treatment session.  She will be placed on hold for a lumpectomy and lymph node removal on 5/31.     PT Next Visit Plan Hold PT secondary to surgery      Patient will benefit from skilled therapeutic intervention in order to improve the following deficits and impairments:     Visit Diagnosis: Bilateral low back pain with left-sided sciatica  Muscle weakness (generalized)  Abnormal  posture  Other abnormalities of gait and mobility  PHYSICAL THERAPY  DISCHARGE SUMMARY  Visits from Start of Care: 10  Current functional level related to goals / functional outcomes: The patient's treatment was on hold from PT while she had surgery for breast CA.  She has not returned to PT and her chart has been inactive for 2 1/2 months.  Will discharge at this time.     Remaining deficits: As above    Education / Equipment: HEP Plan: Patient agrees to discharge.  Patient goals were partially met. Patient is being discharged due to a change in medical status.  ?????          G-Codes - September 17, 2015 1532    Functional Assessment Tool Used FOTO; clinical judgement   Mobility: Walking and Moving Around Current Status ([id number]) At least 40 percent but less than 60 percent impaired, limited or restricted   Mobility: Walking and Moving Around Goal Status [phone]) At least 40 percent but less than 60 percent impaired, limited or restricted    G code discharge CK at least 40%  Problem List Patient Active Problem List   Diagnosis Date Noted  . Breast cancer of upper-inner quadrant of left female breast ([place]) 07/14/2015  . Prediabetes 07/08/2015  . Chronic kidney disease 07/08/2015  . Dependent edema 06/08/2015  . Sleep disturbance 06/08/2015  . Hyperlipidemia 06/08/2015  . Generalized anxiety disorder 06/08/2015  . Essential hypertension 06/08/2015  . Chronic obstructive pulmonary disease ([place]) 06/08/2015  . Gout 06/08/2015  . Acute delirium 05/17/2015  . HCAP (healthcare-associated pneumonia)   . Macrocytosis   . 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 Altered mental status   . Sepsis ([place]) 05/15/2015  . Lumbar spondylosis 03/20/2015  . Paresthesias 04/01/2013  . Tremor 03/19/2013    Ruben Im, PT 09/17/15 5:14 PM Phone: [phone] Fax: [phone]  Alvera Singh 09-17-15, 5:13 PM  [facility] [address] [place], [place], [address] Phone: [phone]   Fax:   [phone]  Name: Brandi Stephens MRN: [id number] Date of Birth: [birth year]

## 2015-09-15 ENCOUNTER — Ambulatory Visit
Admission: RE | Admit: 2015-09-15 | Discharge: 2015-09-15 | Disposition: A | Discharge status: Home / Self Care | Payer: Commercial Managed Care - HMO | Source: Ambulatory Visit | Attending: General Surgery | Admitting: General Surgery

## 2015-09-15 DIAGNOSIS — C50912 Malignant neoplasm of unspecified site of left female breast: Secondary | ICD-10-CM

## 2015-09-15 NOTE — Pre-Procedure Instructions (Signed)
Pt in to pick up Boost Breeze for surgery in AM. Instructions reviewed with pt.

## 2015-09-16 ENCOUNTER — Ambulatory Visit
Admission: RE | Admit: 2015-09-16 | Discharge: 2015-09-16 | Disposition: A | Discharge status: Home / Self Care | Payer: Commercial Managed Care - HMO | Source: Ambulatory Visit | Attending: General Surgery | Admitting: General Surgery

## 2015-09-16 ENCOUNTER — Encounter (HOSPITAL_BASED_OUTPATIENT_CLINIC_OR_DEPARTMENT_OTHER): Payer: Self-pay

## 2015-09-16 ENCOUNTER — Ambulatory Visit (HOSPITAL_COMMUNITY)
Admission: RE | Admit: 2015-09-16 | Discharge: 2015-09-16 | Disposition: A | Discharge status: Home / Self Care | Payer: Commercial Managed Care - HMO | Source: Ambulatory Visit | Attending: General Surgery | Admitting: General Surgery

## 2015-09-16 ENCOUNTER — Ambulatory Visit (HOSPITAL_BASED_OUTPATIENT_CLINIC_OR_DEPARTMENT_OTHER): Payer: Commercial Managed Care - HMO | Admitting: Anesthesiology

## 2015-09-16 ENCOUNTER — Ambulatory Visit (HOSPITAL_BASED_OUTPATIENT_CLINIC_OR_DEPARTMENT_OTHER)
Admission: RE | Admit: 2015-09-16 | Discharge: 2015-09-16 | Disposition: A | Discharge status: Home / Self Care | Payer: Commercial Managed Care - HMO | Source: Ambulatory Visit | Attending: General Surgery | Admitting: General Surgery

## 2015-09-16 ENCOUNTER — Encounter (HOSPITAL_BASED_OUTPATIENT_CLINIC_OR_DEPARTMENT_OTHER)
Admission: RE | Disposition: A | Discharge status: Home / Self Care | Payer: Self-pay | Source: Ambulatory Visit | Attending: General Surgery

## 2015-09-16 ENCOUNTER — Ambulatory Visit: Payer: Commercial Managed Care - HMO | Admitting: Physical Therapy

## 2015-09-16 DIAGNOSIS — Z170 Estrogen receptor positive status [ER+]: Secondary | ICD-10-CM | POA: Diagnosis not present

## 2015-09-16 DIAGNOSIS — I10 Essential (primary) hypertension: Secondary | ICD-10-CM | POA: Diagnosis not present

## 2015-09-16 DIAGNOSIS — E669 Obesity, unspecified: Secondary | ICD-10-CM | POA: Diagnosis not present

## 2015-09-16 DIAGNOSIS — J449 Chronic obstructive pulmonary disease, unspecified: Secondary | ICD-10-CM | POA: Insufficient documentation

## 2015-09-16 DIAGNOSIS — C50912 Malignant neoplasm of unspecified site of left female breast: Secondary | ICD-10-CM

## 2015-09-16 DIAGNOSIS — G2581 Restless legs syndrome: Secondary | ICD-10-CM | POA: Insufficient documentation

## 2015-09-16 DIAGNOSIS — Z87891 Personal history of nicotine dependence: Secondary | ICD-10-CM | POA: Insufficient documentation

## 2015-09-16 DIAGNOSIS — C50212 Malignant neoplasm of upper-inner quadrant of left female breast: Secondary | ICD-10-CM | POA: Diagnosis present

## 2015-09-16 DIAGNOSIS — F419 Anxiety disorder, unspecified: Secondary | ICD-10-CM | POA: Insufficient documentation

## 2015-09-16 DIAGNOSIS — E039 Hypothyroidism, unspecified: Secondary | ICD-10-CM | POA: Diagnosis not present

## 2015-09-16 DIAGNOSIS — Z6837 Body mass index (BMI) 37.0-37.9, adult: Secondary | ICD-10-CM | POA: Diagnosis not present

## 2015-09-16 DIAGNOSIS — M1990 Unspecified osteoarthritis, unspecified site: Secondary | ICD-10-CM | POA: Insufficient documentation

## 2015-09-16 HISTORY — PX: BREAST LUMPECTOMY WITH RADIOACTIVE SEED AND SENTINEL LYMPH NODE BIOPSY: SHX6550

## 2015-09-16 SURGERY — BREAST LUMPECTOMY WITH RADIOACTIVE SEED AND SENTINEL LYMPH NODE BIOPSY
Anesthesia: Regional | Site: Breast | Laterality: Left

## 2015-09-16 MED ORDER — OXYCODONE HCL 5 MG PO TABS: 5.0000 mg | ORAL_TABLET | Freq: Four times a day (QID) | ORAL | Status: DC | PRN

## 2015-09-16 MED ORDER — PHENYLEPHRINE HCL 10 MG/ML IJ SOLN
INTRAMUSCULAR | Status: DC | PRN
  Administered 2015-09-16: 80 ug via INTRAVENOUS
  Administered 2015-09-16 (×2): 40 ug via INTRAVENOUS

## 2015-09-16 MED ORDER — BUPIVACAINE-EPINEPHRINE 0.5% -1:200000 IJ SOLN
INTRAMUSCULAR | Status: DC | PRN
  Administered 2015-09-16: 20 mL

## 2015-09-16 MED ORDER — EPHEDRINE 5 MG/ML INJ
INTRAVENOUS | Status: AC
  Filled 2015-09-16: qty 10

## 2015-09-16 MED ORDER — OXYCODONE HCL 5 MG PO TABS
ORAL_TABLET | ORAL | Status: AC
  Filled 2015-09-16: qty 1

## 2015-09-16 MED ORDER — ONDANSETRON HCL 4 MG/2ML IJ SOLN
INTRAMUSCULAR | Status: AC
  Filled 2015-09-16: qty 2

## 2015-09-16 MED ORDER — FENTANYL CITRATE (PF) 100 MCG/2ML IJ SOLN
INTRAMUSCULAR | Status: AC
  Filled 2015-09-16: qty 2

## 2015-09-16 MED ORDER — ONDANSETRON HCL 4 MG/2ML IJ SOLN: 4.0000 mg | Freq: Once | INTRAMUSCULAR | Status: DC | PRN

## 2015-09-16 MED ORDER — SODIUM CHLORIDE 0.9% FLUSH: 3.0000 mL | INTRAVENOUS | Status: DC | PRN

## 2015-09-16 MED ORDER — EPHEDRINE SULFATE 50 MG/ML IJ SOLN
INTRAMUSCULAR | Status: DC | PRN
  Administered 2015-09-16 (×2): 10 mg via INTRAVENOUS

## 2015-09-16 MED ORDER — OXYCODONE HCL 5 MG PO TABS: 5.0000 mg | ORAL_TABLET | ORAL | Status: DC | PRN

## 2015-09-16 MED ORDER — ONDANSETRON HCL 4 MG/2ML IJ SOLN
INTRAMUSCULAR | Status: DC | PRN
  Administered 2015-09-16: 4 mg via INTRAVENOUS

## 2015-09-16 MED ORDER — MIDAZOLAM HCL 2 MG/2ML IJ SOLN
INTRAMUSCULAR | Status: AC
  Filled 2015-09-16: qty 2

## 2015-09-16 MED ORDER — PHENYLEPHRINE 40 MCG/ML (10ML) SYRINGE FOR IV PUSH (FOR BLOOD PRESSURE SUPPORT)
PREFILLED_SYRINGE | INTRAVENOUS | Status: AC
  Filled 2015-09-16: qty 10

## 2015-09-16 MED ORDER — SODIUM CHLORIDE 0.9% FLUSH: 3.0000 mL | Freq: Two times a day (BID) | INTRAVENOUS | Status: DC

## 2015-09-16 MED ORDER — MIDAZOLAM HCL 2 MG/2ML IJ SOLN
1.0000 mg | INTRAMUSCULAR | Status: DC | PRN
  Administered 2015-09-16 (×2): 1 mg via INTRAVENOUS

## 2015-09-16 MED ORDER — SODIUM CHLORIDE 0.9 % IV SOLN: 250.0000 mL | INTRAVENOUS | Status: DC | PRN

## 2015-09-16 MED ORDER — PROPOFOL 10 MG/ML IV BOLUS
INTRAVENOUS | Status: DC | PRN
  Administered 2015-09-16: 150 mg via INTRAVENOUS

## 2015-09-16 MED ORDER — LIDOCAINE 2% (20 MG/ML) 5 ML SYRINGE
INTRAMUSCULAR | Status: AC
  Filled 2015-09-16: qty 5

## 2015-09-16 MED ORDER — BUPIVACAINE-EPINEPHRINE (PF) 0.25% -1:200000 IJ SOLN
INTRAMUSCULAR | Status: AC
  Filled 2015-09-16: qty 30

## 2015-09-16 MED ORDER — FENTANYL CITRATE (PF) 100 MCG/2ML IJ SOLN: 25.0000 ug | INTRAMUSCULAR | Status: DC | PRN

## 2015-09-16 MED ORDER — METHYLENE BLUE 0.5 % INJ SOLN
INTRAVENOUS | Status: AC
  Filled 2015-09-16: qty 10

## 2015-09-16 MED ORDER — PROPOFOL 10 MG/ML IV BOLUS
INTRAVENOUS | Status: AC
  Filled 2015-09-16: qty 20

## 2015-09-16 MED ORDER — DEXAMETHASONE SODIUM PHOSPHATE 4 MG/ML IJ SOLN
INTRAMUSCULAR | Status: DC | PRN
  Administered 2015-09-16: 10 mg via INTRAVENOUS

## 2015-09-16 MED ORDER — LACTATED RINGERS IV SOLN
INTRAVENOUS | Status: DC
  Administered 2015-09-16 (×2): via INTRAVENOUS

## 2015-09-16 MED ORDER — GLYCOPYRROLATE 0.2 MG/ML IJ SOLN: 0.2000 mg | Freq: Once | INTRAMUSCULAR | Status: DC | PRN

## 2015-09-16 MED ORDER — FENTANYL CITRATE (PF) 100 MCG/2ML IJ SOLN
50.0000 ug | INTRAMUSCULAR | Status: AC | PRN
  Administered 2015-09-16 (×3): 50 ug via INTRAVENOUS

## 2015-09-16 MED ORDER — OXYCODONE HCL 5 MG PO TABS
5.0000 mg | ORAL_TABLET | Freq: Once | ORAL | Status: AC
  Administered 2015-09-16: 5 mg via ORAL

## 2015-09-16 MED ORDER — SCOPOLAMINE 1 MG/3DAYS TD PT72: 1.0000 | MEDICATED_PATCH | Freq: Once | TRANSDERMAL | Status: DC | PRN

## 2015-09-16 MED ORDER — BUPIVACAINE-EPINEPHRINE (PF) 0.5% -1:200000 IJ SOLN
INTRAMUSCULAR | Status: DC | PRN
Start: 2015-09-16 — End: 2015-09-16
  Administered 2015-09-16: 30 mL via PERINEURAL

## 2015-09-16 MED ORDER — CIPROFLOXACIN IN D5W 400 MG/200ML IV SOLN
INTRAVENOUS | Status: AC
  Filled 2015-09-16: qty 200

## 2015-09-16 MED ORDER — DEXAMETHASONE SODIUM PHOSPHATE 10 MG/ML IJ SOLN
INTRAMUSCULAR | Status: AC
  Filled 2015-09-16: qty 1

## 2015-09-16 MED ORDER — ACETAMINOPHEN 325 MG PO TABS: 650.0000 mg | ORAL_TABLET | ORAL | Status: DC | PRN

## 2015-09-16 MED ORDER — LIDOCAINE HCL (CARDIAC) 20 MG/ML IV SOLN
INTRAVENOUS | Status: DC | PRN
  Administered 2015-09-16: 30 mg via INTRAVENOUS

## 2015-09-16 MED ORDER — CIPROFLOXACIN IN D5W 400 MG/200ML IV SOLN
400.0000 mg | INTRAVENOUS | Status: AC
  Administered 2015-09-16: 400 mg via INTRAVENOUS

## 2015-09-16 MED ORDER — ACETAMINOPHEN 650 MG RE SUPP: 650.0000 mg | RECTAL | Status: DC | PRN

## 2015-09-16 MED ORDER — SODIUM CHLORIDE 0.9 % IJ SOLN
INTRAMUSCULAR | Status: AC
  Filled 2015-09-16: qty 10

## 2015-09-16 MED ORDER — TECHNETIUM TC 99M SULFUR COLLOID FILTERED
1.0000 | Freq: Once | INTRAVENOUS | Status: AC | PRN
  Administered 2015-09-16: 1 via INTRADERMAL

## 2015-09-16 SURGICAL SUPPLY — 72 items
ADH SKN CLS APL DERMABOND .7 (GAUZE/BANDAGES/DRESSINGS) ×1
APPLIER CLIP 9.375 MED OPEN (MISCELLANEOUS)
APR CLP MED 9.3 20 MLT OPN (MISCELLANEOUS)
BINDER BREAST LRG (GAUZE/BANDAGES/DRESSINGS) IMPLANT
BINDER BREAST MEDIUM (GAUZE/BANDAGES/DRESSINGS) IMPLANT
BINDER BREAST XLRG (GAUZE/BANDAGES/DRESSINGS) IMPLANT
BINDER BREAST XXLRG (GAUZE/BANDAGES/DRESSINGS) ×2 IMPLANT
BLADE SURG 10 STRL SS (BLADE) ×1 IMPLANT
BLADE SURG 15 STRL LF DISP TIS (BLADE) ×1 IMPLANT
BLADE SURG 15 STRL SS (BLADE) ×3
BNDG COHESIVE 4X5 TAN STRL (GAUZE/BANDAGES/DRESSINGS) ×3 IMPLANT
CANISTER SUC SOCK COL 7IN (MISCELLANEOUS) IMPLANT
CANISTER SUCT 1200ML W/VALVE (MISCELLANEOUS) ×3 IMPLANT
CHLORAPREP W/TINT 26ML (MISCELLANEOUS) ×3 IMPLANT
CLIP APPLIE 9.375 MED OPEN (MISCELLANEOUS) IMPLANT
CLIP TI LARGE 6 (CLIP) ×3 IMPLANT
CLIP TI MEDIUM 6 (CLIP) ×6 IMPLANT
CLIP TI WIDE RED SMALL 6 (CLIP) IMPLANT
CLOSURE WOUND 1/2 X4 (GAUZE/BANDAGES/DRESSINGS) ×1
COVER MAYO STAND STRL (DRAPES) ×3 IMPLANT
COVER PROBE W GEL 5X96 (DRAPES) ×3 IMPLANT
DECANTER SPIKE VIAL GLASS SM (MISCELLANEOUS) IMPLANT
DERMABOND ADVANCED (GAUZE/BANDAGES/DRESSINGS) ×2
DERMABOND ADVANCED .7 DNX12 (GAUZE/BANDAGES/DRESSINGS) ×1 IMPLANT
DEVICE DUBIN W/COMP PLATE 8390 (MISCELLANEOUS) ×3 IMPLANT
DRAPE UTILITY XL STRL (DRAPES) ×3 IMPLANT
DRSG PAD ABDOMINAL 8X10 ST (GAUZE/BANDAGES/DRESSINGS) ×2 IMPLANT
ELECT COATED BLADE 2.86 ST (ELECTRODE) ×3 IMPLANT
ELECT REM PT RETURN 9FT ADLT (ELECTROSURGICAL) ×3
ELECTRODE REM PT RTRN 9FT ADLT (ELECTROSURGICAL) ×1 IMPLANT
GLOVE BIO SURGEON STRL SZ 6 (GLOVE) ×3 IMPLANT
GLOVE BIOGEL PI IND STRL 6.5 (GLOVE) ×1 IMPLANT
GLOVE BIOGEL PI IND STRL 7.0 (GLOVE) IMPLANT
GLOVE BIOGEL PI INDICATOR 6.5 (GLOVE) ×2
GLOVE BIOGEL PI INDICATOR 7.0 (GLOVE) ×4
GLOVE ECLIPSE 6.5 STRL STRAW (GLOVE) ×6 IMPLANT
GLOVE EXAM NITRILE EXT CUFF MD (GLOVE) ×2 IMPLANT
GOWN STRL REUS W/ TWL LRG LVL3 (GOWN DISPOSABLE) ×1 IMPLANT
GOWN STRL REUS W/TWL 2XL LVL3 (GOWN DISPOSABLE) ×3 IMPLANT
GOWN STRL REUS W/TWL LRG LVL3 (GOWN DISPOSABLE) ×6
ILLUMINATOR WAVEGUIDE N/F (MISCELLANEOUS) IMPLANT
KIT MARKER MARGIN INK (KITS) ×3 IMPLANT
LIGHT WAVEGUIDE WIDE FLAT (MISCELLANEOUS) ×2 IMPLANT
NDL HYPO 25X1 1.5 SAFETY (NEEDLE) ×1 IMPLANT
NDL SAFETY ECLIPSE 18X1.5 (NEEDLE) IMPLANT
NEEDLE HYPO 18GX1.5 SHARP (NEEDLE)
NEEDLE HYPO 25X1 1.5 SAFETY (NEEDLE) ×6 IMPLANT
NS IRRIG 1000ML POUR BTL (IV SOLUTION) ×3 IMPLANT
PACK BASIN DAY SURGERY FS (CUSTOM PROCEDURE TRAY) ×3 IMPLANT
PACK UNIVERSAL I (CUSTOM PROCEDURE TRAY) ×3 IMPLANT
PENCIL BUTTON HOLSTER BLD 10FT (ELECTRODE) ×3 IMPLANT
SLEEVE SCD COMPRESS KNEE MED (MISCELLANEOUS) ×3 IMPLANT
SPONGE GAUZE 4X4 12PLY STER LF (GAUZE/BANDAGES/DRESSINGS) ×3 IMPLANT
SPONGE LAP 18X18 X RAY DECT (DISPOSABLE) ×6 IMPLANT
STAPLER VISISTAT 35W (STAPLE) IMPLANT
STOCKINETTE IMPERVIOUS LG (DRAPES) ×3 IMPLANT
STRIP CLOSURE SKIN 1/2X4 (GAUZE/BANDAGES/DRESSINGS) ×2 IMPLANT
SUT ETHILON 2 0 FS 18 (SUTURE) IMPLANT
SUT MNCRL AB 4-0 PS2 18 (SUTURE) ×3 IMPLANT
SUT MON AB 5-0 PS2 18 (SUTURE) IMPLANT
SUT SILK 2 0 SH (SUTURE) IMPLANT
SUT VIC AB 2-0 SH 27 (SUTURE) ×3
SUT VIC AB 2-0 SH 27XBRD (SUTURE) ×1 IMPLANT
SUT VIC AB 3-0 SH 27 (SUTURE) ×6
SUT VIC AB 3-0 SH 27X BRD (SUTURE) ×1 IMPLANT
SUT VIC AB 5-0 PS2 18 (SUTURE) IMPLANT
SYR CONTROL 10ML LL (SYRINGE) ×5 IMPLANT
TOWEL OR 17X24 6PK STRL BLUE (TOWEL DISPOSABLE) ×3 IMPLANT
TOWEL OR NON WOVEN STRL DISP B (DISPOSABLE) ×1 IMPLANT
TUBE CONNECTING 20'X1/4 (TUBING) ×1
TUBE CONNECTING 20X1/4 (TUBING) ×2 IMPLANT
YANKAUER SUCT BULB TIP NO VENT (SUCTIONS) ×3 IMPLANT

## 2015-09-16 NOTE — Anesthesia Postprocedure Evaluation (Signed)
Anesthesia Post Note  Patient: Brandi Stephens  Procedure(s) Performed: Procedure(s) (LRB): BREAST LUMPECTOMY WITH RADIOACTIVE SEED AND SENTINEL LYMPH NODE BIOPSY (Left)  Patient location during evaluation: PACU Anesthesia Type: General and Regional Level of consciousness: awake and alert Pain management: pain level controlled Vital Signs Assessment: post-procedure vital signs reviewed and stable Respiratory status: spontaneous breathing, nonlabored ventilation, respiratory function stable and patient connected to nasal cannula oxygen Cardiovascular status: blood pressure returned to baseline and stable Postop Assessment: no signs of nausea or vomiting Anesthetic complications: no    Last Vitals:  Filed Vitals:   09/16/15 1440 09/16/15 1457  BP: 117/74   Pulse: 90 77  Temp:  36.9 C  Resp: 20 16    Last Pain:  Filed Vitals:   09/16/15 1519  PainSc: 2                  Catalina Gravel

## 2015-09-16 NOTE — Anesthesia Preprocedure Evaluation (Addendum)
Anesthesia Evaluation  Patient identified by MRN, date of birth, ID band Patient awake    Reviewed: Allergy & Precautions, NPO status , Patient's Chart, lab work & pertinent test results  History of Anesthesia Complications (+) PONV and history of anesthetic complications  Airway Mallampati: II  TM Distance: >3 FB Neck ROM: Full    Dental  (+) Teeth Intact, Dental Advisory Given   Pulmonary COPD,  COPD inhaler, former smoker,    Pulmonary exam normal breath sounds clear to auscultation       Cardiovascular hypertension, Pt. on medications negative cardio ROS Normal cardiovascular exam Rhythm:Regular Rate:Normal     Neuro/Psych PSYCHIATRIC DISORDERS Anxiety RLS    GI/Hepatic negative GI ROS, Neg liver ROS,   Endo/Other  Hypothyroidism Obesity   Renal/GU negative Renal ROS     Musculoskeletal  (+) Arthritis , Osteoarthritis,    Abdominal   Peds  Hematology negative hematology ROS (+)   Anesthesia Other Findings Day of surgery medications reviewed with the patient.  Left breast cancer   Reproductive/Obstetrics                           Anesthesia Physical Anesthesia Plan  ASA: III  Anesthesia Plan: General and Regional   Post-op Pain Management:  Regional for Post-op pain   Induction: Intravenous  Airway Management Planned: LMA  Additional Equipment:   Intra-op Plan:   Post-operative Plan: Extubation in OR  Informed Consent: I have reviewed the patients History and Physical, chart, labs and discussed the procedure including the risks, benefits and alternatives for the proposed anesthesia with the patient or authorized representative who has indicated his/her understanding and acceptance.   Dental advisory given  Plan Discussed with: CRNA  Anesthesia Plan Comments: (Risks/benefits of general anesthesia discussed with patient including risk of damage to teeth, lips, gum,  and tongue, nausea/vomiting, allergic reactions to medications, and the possibility of heart attack, stroke and death.  All patient questions answered.  Patient wishes to proceed.  Left PECS block in preop area.)       Anesthesia Quick Evaluation

## 2015-09-16 NOTE — Op Note (Signed)
Left Breast Radioactive seed localized lumpectomy and sentinel lymph node biopsy  Indications: This patient presents with history of Left breast cancer, upper inner quadrant, [id number]  Pre-operative Diagnosis: left breast cancer  Post-operative Diagnosis: Same  Surgeon: Stark Klein   Anesthesia: General endotracheal anesthesia  ASA Class: 3  Procedure Details  The patient was seen in the Holding Room. The risks, benefits, complications, treatment options, and expected outcomes were discussed with the patient. The possibilities of bleeding, infection, the need for additional procedures, failure to diagnose a condition, and creating a complication requiring transfusion or operation were discussed with the patient. The patient concurred with the proposed plan, giving informed consent.  The site of surgery properly noted/marked. The patient was taken to Operating Room # 2, identified, and the procedure verified as Left Breast Seed localized Lumpectomy with sentinel lymph node biopsy. A Time Out was held and the above information confirmed.  The left arm, breast, and chest were prepped and draped in standard fashion. The lumpectomy was performed by creating an upper circumareolar incision over the previously placed radioactive seed.  Dissection was carried down to around the point of maximum signal intensity. The cautery was used to perform the dissection.  Hemostasis was achieved with cautery. The edges of the cavity were marked with large clips, with one each medial, lateral, inferior and superior, and two clips posteriorly.   The specimen was inked with the margin marker paint kit.    Specimen radiography confirmed inclusion of the mammographic lesion, the clip, and the seed.  The background signal in the breast was zero.  The wound was irrigated and closed with 3-0 vicryl in layers and 4-0 monocryl subcuticular suture.    Using a hand-held gamma probe, left axillary sentinel nodes were identified  transcutaneously.  An oblique incision was created below the axillary hairline.  Dissection was carried through the clavipectoral fascia.  Two level 2 axillary sentinel nodes were removed.  Counts per second were 500 and 90.    The background count was 5 cps.  The wound was irrigated.  Hemostasis was achieved with cautery.  The axillary incision was closed with a 3-0 vicryl deep dermal interrupted sutures and a 4-0 monocryl subcuticular closure.    Sterile dressings were applied. At the end of the operation, all sponge, instrument, and needle counts were correct.  Findings: grossly clear surgical margins and no adenopathy Estimated Blood Loss:  min         Specimens: Left breast lumpectomy and two left axillary sentinel lymph nodes.             Complications:  None; patient tolerated the procedure well.         Disposition: PACU - hemodynamically stable.         Condition: stable

## 2015-09-16 NOTE — Progress Notes (Signed)
Assisted Dr. Turk with left, ultrasound guided, pectoralis block. Side rails up, monitors on throughout procedure. See vital signs in flow sheet. Tolerated Procedure well. 

## 2015-09-16 NOTE — Transfer of Care (Signed)
Immediate Anesthesia Transfer of Care Note  Patient: Brandi Stephens  Procedure(s) Performed: Procedure(s): BREAST LUMPECTOMY WITH RADIOACTIVE SEED AND SENTINEL LYMPH NODE BIOPSY (Left)  Patient Location: PACU  Anesthesia Type:GA combined with regional for post-op pain  Level of Consciousness: awake, alert , oriented and patient cooperative  Airway & Oxygen Therapy: Patient Spontanous Breathing and Patient connected to face mask oxygen  Post-op Assessment: Report given to RN and Post -op Vital signs reviewed and stable  Post vital signs: Reviewed and stable  Last Vitals:  Filed Vitals:   09/16/15 1125 09/16/15 1130  BP:  92/53  Pulse: 93 95  Temp:    Resp: 15 19    Last Pain:  Filed Vitals:   09/16/15 1134  PainSc: 5       Patients Stated Pain Goal: 2 ([id number])  Complications: No apparent anesthesia complications

## 2015-09-16 NOTE — H&P (Signed)
Location: [facility] Patient #: [phone] DOB: [birth year] Undefined / Language: Cleophus Molt / Race: White Female   History of Present Illness The patient is a 70 year old female who presents with breast cancer. Pt is a 70 yo F who presented wtih a painful right breast lump. She was found to have increased density in the area of thickening on the right that was biopsied and was fibrocystic change with adenosis and calcifiections. However, on the left side, she was found to have an area of architectural distortion in the upper inner quadrant. 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 This was also ER/PR positive, her 2 negative, and Ki67 3%.  Patient has significant family history of cancer. Mother had uterine cancer at age 65. Sister had vaginal cancer. One maternal aunt had breast cancer, one maternal aunt had lung and brain cancer, maternal uncle had lung cancer, and maternal grandmother had stomach cancer. Patient had menarche at age 42. She used OCPs and IUD. L4T6, with first child in early 53s. Menopause was in the early 46s. She is s/p hysterectomy in 1982.   path Diagnosis 1. Breast, left, needle core biopsy, left upper inner quadrant - INVASIVE DUCTAL CARCINOMA. - LOBULAR NEOPLASIA (ATYPICAL LOBULAR HYPERPLASIA). - FIBROCYSTIC CHANGES WITH ADENOSIS AND CALCIFICATIONS. - SEE COMMENT. 2. Breast, right, needle core biopsy, 8:00 o'clock, 12 CMFN - FIBROCYSTIC CHANGES WITH ADENOSIS AND CALCIFICATIONS. - THERE IS NO EVIDENCE OF MALIGNANCY.  LABORATORY RESULTS: Available labs are reviewed CBC with Differential/Platelet Status: Abnormal Collection Time: 07/22/15 12:27 PM Result Value Ref Range WBC 10.4 (H) 3.9 - 10.3 10e3/uL NEUT# 7.0 (H) 1.5 - 6.5 10e3/uL HGB 14.4 11.6 - 15.9 g/dL HCT 44.1 34.8 - 46.6 % Platelets 283 145 - 400 10e3/uL MCV 93.0 79.5 - 101.0 fL MCH 30.3 25.1 - 34.0 pg MCHC 32.6 31.5  - 36.0 g/dL RBC 4.74 3.70 - 5.45 10e6/uL RDW 15.1 (H) 11.2 - 14.5 % lymph# 2.1 0.9 - 3.3 10e3/uL MONO# 0.8 0.1 - 0.9 10e3/uL Eosinophils Absolute 0.3 0.0 - 0.5 10e3/uL Basophils Absolute 0.1 0.0 - 0.1 10e3/uL NEUT% 67.5 38.4 - 76.8 % LYMPH% 20.0 14.0 - 49.7 % MONO% 8.1 0.0 - 14.0 % EOS% 3.1 0.0 - 7.0 % BASO% 1.3 0.0 - 2.0 % Comprehensive metabolic panel Status: Abnormal Collection Time: 07/22/15 12:27 PM Result Value Ref Range Sodium 140 136 - 145 mEq/L Potassium 5.1 3.5 - 5.1 mEq/L Chloride 106 98 - 109 mEq/L CO2 26 22 - 29 mEq/L Glucose 100 70 - 140 mg/dl Comment: Glucose reference range is for nonfasting patients. Fasting glucose reference range is 70- 100. BUN 25.0 7.0 - 26.0 mg/dL Creatinine 1.3 (H) 0.6 - 1.1 mg/dL Total Bilirubin 0.30 0.20 - 1.20 mg/dL Alkaline Phosphatase 83 40 - 150 U/L AST 13 5 - 34 U/L ALT 13 0 - 55 U/L Total Protein 7.1 6.4 - 8.3 g/dL Albumin 3.6 3.5 - 5.0 g/dL Calcium 9.8 8.4 - 10.4 mg/dL Anion Gap 8 3 - 11 mEq/L EGFR 43 (L) >90 ml/min/1.73 m2 Comment: eGFR is calculated using the CKD-EPI Creatinine Equation (2009) TECHNOLOGIST REVIEW Status: None Collection Time: 07/22/15 12:27 PM Result Value Ref Range Technologist Review Rare meta    RADIOLOGY RESULTS: See E-Chart or I-Site for most recent results. Images and reports are reviewed.   Mm Lt Breast Bx W Loc Dev 1st Lesion Image Bx Spec Stereo Guide  07/15/2015 ADDENDUM REPORT: 07/15/2015 08:20 ADDENDUM: Pathology revealed grade I invasive ductal carcinoma,  atypical lobular hyperplasia, and fibrocystic changes with adenosis and calcifications in the left breast. The right breast showed fibrocystic changes with adenosis and calcifications. This was found to be concordant by Dr. Pamelia Hoit. Pathology results were discussed with the patient by telephone. The patient reported doing well after the biopsy. Post biopsy instructions and care were reviewed and questions were answered. The  patient was encouraged to call [facility] for any additional concerns. The patient was referred to the [facility] at the [facility] on July 22, 2015. Pathology results reported by Susa Raring RN, BSN on 07/15/2015. Electronically Signed By: Pamelia Hoit M.D. On: 07/15/2015 08:20  07/15/2015 CLINICAL DATA: 70 year old female for tomosynthesis guided biopsy of a left breast distortion EXAM: LEFT BREAST STEREOTACTIC CORE NEEDLE BIOPSY COMPARISON: Previous exams. FINDINGS: The patient and I discussed the procedure of stereotactic/tomosynthesis -guided biopsy including benefits and alternatives. We discussed the high likelihood of a successful procedure. We discussed the risks of the procedure including infection, bleeding, tissue injury, clip migration, and inadequate sampling. Informed written consent was given. The usual time out protocol was performed immediately prior to the procedure. Using sterile technique and 1% Lidocaine as local anesthetic, under stereotactic guidance, a 9 gauge vacuum assist device was used to perform core needle biopsy of a distortion within the upper, inner left breast using a superior to inferior approach. At the conclusion of the procedure, a coil shaped tissue marker clip was deployed into the biopsy cavity. Follow-up 2-view mammogram was performed and dictated separately. IMPRESSION: Stereotactic/ tomosynthesis -guided biopsy of a left breast distortion. No apparent complications. Electronically Signed: By: Pamelia Hoit M.D. On: 07/13/2015 11:21  [place] Rt Breast Bx W Loc Dev 1st Lesion Img Bx Spec US Guide  07/15/2015 ADDENDUM REPORT: 07/15/2015 08:20 ADDENDUM: Pathology revealed grade I invasive ductal carcinoma, atypical lobular hyperplasia, and fibrocystic changes with adenosis and calcifications in the left breast. The right breast showed fibrocystic changes with adenosis and calcifications. This was  found to be concordant by Dr. Pamelia Hoit. Pathology results were discussed with the patient by telephone. The patient reported doing well after the biopsy. Post biopsy instructions and care were reviewed and questions were answered. The patient was encouraged to call [facility] for any additional concerns. The patient was referred to the [facility] at the [facility] on July 22, 2015. Pathology results reported by Susa Raring RN, BSN on 07/15/2015. Electronically Signed By: Pamelia Hoit M.D. On: 07/15/2015 08:20  07/15/2015 CLINICAL DATA: 70 year old female for ultrasound-guided biopsy of a possible subtle area of distortion within the right breast at 8 o'clock, 12 cm from the nipple EXAM: ULTRASOUND GUIDED RIGHT BREAST CORE NEEDLE BIOPSY COMPARISON: Previous exam(s). PROCEDURE: I met with the patient and we discussed the procedure of ultrasound-guided biopsy, including benefits and alternatives. We discussed the high likelihood of a successful procedure. We discussed the risks of the procedure including infection, bleeding, tissue injury, clip migration, and inadequate sampling. Informed written consent was given. The usual time-out protocol was performed immediately prior to the procedure. Using sterile technique and 1% Lidocaine as local anesthetic, under direct ultrasound visualization, a 12 gauge vacuum-assisted device was used to perform biopsy of a possible subtle area of distortion within the right breast at 8 o'clock, 12 cm from the nipple using a lateral to medial approach. At the conclusion of the procedure, a heart shaped tissue marker clip was deployed  into the biopsy cavity. Follow-up 2-view mammogram was performed and dictated separately. IMPRESSION: Ultrasound-guided biopsy of the right breast. No apparent complications. Electronically Signed: By: Pamelia Hoit M.D. On: 07/13/2015 11:44    Other Problems  Anxiety  Disorder Arthritis Back Pain Chronic Obstructive Lung Disease Chronic Renal Failure Syndrome Hemorrhoids High blood pressure Hypercholesterolemia Melanoma Seizure Disorder Thyroid Disease  Past Surgical History Breast Biopsy Bilateral. Hemorrhoidectomy Hysterectomy (not due to cancer) - Partial Mammoplasty; Reduction Bilateral. Tonsillectomy  Diagnostic Studies History  Colonoscopy 1-5 years ago Mammogram within last year Pap Smear 1-5 years ago  Social History  Alcohol use Moderate alcohol use. Caffeine use Coffee. No drug use Tobacco use Former smoker.  Family History Alcohol Abuse Daughter, Father, Mother, Son. Arthritis Father, Mother. Cancer Mother, Sister. Colon Polyps Mother. Depression Daughter. Heart Disease Mother. Hypertension Mother.  Pregnancy / Birth History Age at menarche 84 years. Age of menopause 51-55 Contraceptive History Intrauterine device, Oral contraceptives. Gravida 6 Maternal age 34-25 Para 3    Review of Systems  General Not Present- Appetite Loss, Chills, Fatigue, Fever, Night Sweats, Weight Gain and Weight Loss. Skin Not Present- Change in Wart/Mole, Dryness, Hives, Jaundice, New Lesions, Non-Healing Wounds, Rash and Ulcer. HEENT Present- Seasonal Allergies. Not Present- Earache, Hearing Loss, Hoarseness, Nose Bleed, Oral Ulcers, Ringing in the Ears, Sinus Pain, Sore Throat, Visual Disturbances, Wears glasses/contact lenses and Yellow Eyes. Respiratory Not Present- Bloody sputum, Chronic Cough, Difficulty Breathing, Snoring and Wheezing. Breast Present- Breast Pain. Not Present- Breast Mass, Nipple Discharge and Skin Changes. Cardiovascular Present- Swelling of Extremities. Not Present- Chest Pain, Difficulty Breathing Lying Down, Leg Cramps, Palpitations, Rapid Heart Rate and Shortness of Breath. Gastrointestinal Not Present- Abdominal Pain, Bloating, Bloody Stool, Change in Bowel Habits, Chronic  diarrhea, Constipation, Difficulty Swallowing, Excessive gas, Gets full quickly at meals, Hemorrhoids, Indigestion, Nausea, Rectal Pain and Vomiting. Female Genitourinary Present- Frequency. Not Present- Nocturia, Painful Urination, Pelvic Pain and Urgency. Musculoskeletal Present- Back Pain, Joint Pain, Joint Stiffness and Swelling of Extremities. Not Present- Muscle Pain and Muscle Weakness. Neurological Present- Numbness and Tremor. Not Present- Decreased Memory, Fainting, Headaches, Seizures, Tingling, Trouble walking and Weakness. Psychiatric Present- Anxiety. Not Present- Bipolar, Change in Sleep Pattern, Depression, Fearful and Frequent crying. Endocrine Present- Heat Intolerance. Not Present- Cold Intolerance, Excessive Hunger, Hair Changes, Hot flashes and New Diabetes. Hematology Present- Gland problems. Not Present- Easy Bruising, Excessive bleeding, HIV and Persistent Infections.   Physical Exam General Mental Status-Alert. General Appearance-Consistent with stated age. Hydration-Well hydrated. Voice-Normal.  Head and Neck Head-normocephalic, atraumatic with no lesions or palpable masses. Trachea-midline. Thyroid Gland Characteristics - normal size and consistency.  Eye Eyeball - Bilateral-Extraocular movements intact. Sclera/Conjunctiva - Bilateral-No scleral icterus.  Chest and Lung Exam Chest and lung exam reveals -quiet, even and easy respiratory effort with no use of accessory muscles and on auscultation, normal breath sounds, no adventitious sounds and normal vocal resonance. Inspection Chest Wall - Normal. Back - normal.  Breast Note: breasts are reasonably symmetric. No palpable masses. both breasts are dense. no nipple retraction or discharge. small hematoma from biopsy on left.   Cardiovascular Cardiovascular examination reveals -normal heart sounds, regular rate and rhythm with no murmurs and normal pedal pulses  bilaterally.  Abdomen Inspection Inspection of the abdomen reveals - No Hernias. Palpation/Percussion Palpation and Percussion of the abdomen reveal - Soft, Non Tender, No Rebound tenderness, No Rigidity (guarding) and No hepatosplenomegaly. Auscultation Auscultation of the abdomen reveals - Bowel sounds normal.  Neurologic Neurologic evaluation reveals -alert and oriented  x 3 with no impairment of recent or remote memory. Mental Status-Normal.  Musculoskeletal Global Assessment -Note: no gross deformities.  Normal Exam - Left-Upper Extremity Strength Normal and Lower Extremity Strength Normal. Normal Exam - Right-Upper Extremity Strength Normal and Lower Extremity Strength Normal.  Lymphatic Head & Neck  General Head & Neck Lymphatics: Bilateral - Description - Normal. Axillary  General Axillary Region: Bilateral - Description - Normal. Tenderness - Non Tender. Femoral & Inguinal  Generalized Femoral & Inguinal Lymphatics: Bilateral - Description - No Generalized lymphadenopathy.    Assessment & Plan  CARCINOMA OF UPPER-INNER QUADRANT OF LEFT BREAST IN FEMALE, ESTROGEN RECEPTOR POSITIVE (C50.212) Impression: The patient has a new diagnosis of a left breast cancer. It is difficult to assess stage given the arcitectural distortion. It is probably cT1N0. MRI was performed and additional biopsies were done.  She remains a candidate for breast conservation.    She will be recommended to receive adjuvant chemoprevention and external beam radiation.  45 min spent in evaluation, examination, counseling, and coordination of care. >50% spent in counseling.    Signed by Stark Klein, MD (08/04/2015 11:06 PM

## 2015-09-16 NOTE — Interval H&P Note (Signed)
History and Physical Interval Note:  09/16/2015 12:23 PM  Brandi Stephens  has presented today for surgery, with the diagnosis of LEFT BREAST CANCER  The various methods of treatment have been discussed with the patient and family. After consideration of risks, benefits and other options for treatment, the patient has consented to  Procedure(s): BREAST LUMPECTOMY WITH RADIOACTIVE SEED AND SENTINEL LYMPH NODE BIOPSY (Left) as a surgical intervention .  The patient's history has been reviewed, patient examined, no change in status, stable for surgery.  I have reviewed the patient's chart and labs.  Questions were answered to the patient's satisfaction.     [name]

## 2015-09-16 NOTE — Discharge Instructions (Addendum)
[facility] °Office Phone Number [phone] ° °BREAST BIOPSY/ PARTIAL MASTECTOMY: POST OP INSTRUCTIONS ° °Always review your discharge instruction sheet given to you by the facility where your surgery was performed. ° °IF YOU HAVE DISABILITY OR FAMILY LEAVE FORMS, YOU MUST BRING THEM TO THE OFFICE FOR PROCESSING.  DO NOT GIVE THEM TO YOUR DOCTOR. ° °1. A prescription for pain medication may be given to you upon discharge.  Take your pain medication as prescribed, if needed.  If narcotic pain medicine is not needed, then you may take acetaminophen (Tylenol) or ibuprofen (Advil) as needed. °2. Take your usually prescribed medications unless otherwise directed °3. If you need a refill on your pain medication, please contact your pharmacy.  They will contact our office to request authorization.  Prescriptions will not be filled after 5pm or on week-ends. °4. You should eat very light the first 24 hours after surgery, such as soup, crackers, pudding, etc.  Resume your normal diet the day after surgery. °5. Most patients will experience some swelling and bruising in the breast.  Ice packs and a good support bra will help.  Swelling and bruising can take several days to resolve.  °6. It is common to experience some constipation if taking pain medication after surgery.  Increasing fluid intake and taking a stool softener will usually help or prevent this problem from occurring.  A mild laxative (Milk of Magnesia or Miralax) should be taken according to package directions if there are no bowel movements after 48 hours. °7. Unless discharge instructions indicate otherwise, you may remove your bandages 48 hours after surgery, and you may shower at that time.  You may have steri-strips (small skin tapes) in place directly over the incision.  These strips should be left on the skin for 7-10 days.   Any sutures or staples will be removed at the office during your follow-up visit. °8. ACTIVITIES:  You may resume  regular daily activities (gradually increasing) beginning the next day.  Wearing a good support bra or sports bra (or the breast binder) minimizes pain and swelling.  You may have sexual intercourse when it is comfortable. °a. You may drive when you no longer are taking prescription pain medication, you can comfortably wear a seatbelt, and you can safely maneuver your car and apply brakes. °b. RETURN TO WORK:  __________1 week_______________ °9. You should see your doctor in the office for a follow-up appointment approximately two weeks after your surgery.  Your doctor’s nurse will typically make your follow-up appointment when she calls you with your pathology report.  Expect your pathology report 2-3 business days after your surgery.  You may call to check if you do not hear from us after three days. ° ° °WHEN TO CALL YOUR DOCTOR: °1. Fever over 101.0 °2. Nausea and/or vomiting. °3. Extreme swelling or bruising. °4. Continued bleeding from incision. °5. Increased pain, redness, or drainage from the incision. ° °The clinic staff is available to answer your questions during regular business hours.  Please don’t hesitate to call and ask to speak to one of the nurses for clinical concerns.  If you have a medical emergency, go to the nearest emergency room or call 911.  A surgeon from [facility] is always on call at the hospital. ° °For further questions, please visit [email or web address]  ° ° °Post Anesthesia Home Care Instructions ° °Activity: °Get plenty of rest for the remainder of the day. A responsible adult should stay with you for 24   hours following the procedure.  °For the next 24 hours, DO NOT: °-Drive a car °-Operate machinery °-Drink alcoholic beverages °-Take any medication unless instructed by your physician °-Make any legal decisions or sign important papers. ° °Meals: °Start with liquid foods such as gelatin or soup. Progress to regular foods as tolerated. Avoid greasy, spicy, heavy  foods. If nausea and/or vomiting occur, drink only clear liquids until the nausea and/or vomiting subsides. Call your physician if vomiting continues. ° °Special Instructions/Symptoms: °Your throat may feel dry or sore from the anesthesia or the breathing tube placed in your throat during surgery. If this causes discomfort, gargle with warm salt water. The discomfort should disappear within 24 hours. ° °If you had a scopolamine patch placed behind your ear for the management of post- operative nausea and/or vomiting: ° °1. The medication in the patch is effective for 72 hours, after which it should be removed.  Wrap patch in a tissue and discard in the trash. Wash hands thoroughly with soap and water. °2. You may remove the patch earlier than 72 hours if you experience unpleasant side effects which may include dry mouth, dizziness or visual disturbances. °3. Avoid touching the patch. Wash your hands with soap and water after contact with the patch. °  ° °

## 2015-09-16 NOTE — Anesthesia Procedure Notes (Addendum)
Anesthesia Regional Block:  Pectoralis block  Pre-Anesthetic Checklist: ,, timeout performed, Correct Patient, Correct Site, Correct Laterality, Correct Procedure, Correct Position, site marked, Risks and benefits discussed,  Surgical consent,  Pre-op evaluation,  At surgeon's request and post-op pain management  Laterality: Left  Prep: chloraprep       Needles:  Injection technique: Single-shot  Needle Type: Echogenic Needle     Needle Length: 9cm 9 cm Needle Gauge: 21 and 21 G    Additional Needles:  Procedures: ultrasound guided (picture in chart) Pectoralis block Narrative:  Injection made incrementally with aspirations every 5 mL.  Performed by: Personally  Anesthesiologist: Catalina Gravel  Additional Notes: No pain on injection. No increased resistance to injection. Injection made in 5cc increments.  Good needle visualization.  Patient tolerated procedure well.  Picture printed and saved to patient's medical record.   Procedure Name: LMA Insertion Date/Time: 09/16/2015 12:44 PM Performed by: Marly Schuld D Pre-anesthesia Checklist: Patient identified, Emergency Drugs available, Suction available and Patient being monitored Patient Re-evaluated:Patient Re-evaluated prior to inductionOxygen Delivery Method: Circle system utilized Preoxygenation: Pre-oxygenation with 100% oxygen Intubation Type: IV induction Ventilation: Mask ventilation without difficulty LMA: LMA inserted LMA Size: 4.0 Number of attempts: 1 Airway Equipment and Method: Bite block Placement Confirmation: positive ETCO2 Tube secured with: Tape Dental Injury: Teeth and Oropharynx as per pre-operative assessment

## 2015-09-17 ENCOUNTER — Encounter (HOSPITAL_BASED_OUTPATIENT_CLINIC_OR_DEPARTMENT_OTHER): Payer: Self-pay | Admitting: General Surgery

## 2015-09-22 NOTE — Progress Notes (Signed)
Quick Note:  Please let patient know margins and lymph nodes are negative. ______ 

## 2015-09-24 ENCOUNTER — Ambulatory Visit: Payer: Self-pay | Admitting: Psychiatry

## 2015-09-24 ENCOUNTER — Encounter: Payer: Self-pay | Admitting: Radiation Oncology

## 2015-09-24 NOTE — Progress Notes (Signed)
Location of Breast Cancer: Left Breast  Upper inner Quadrant  Histology per Pathology Report:Diagnosis 08/24/15: Breast, right, needle core biopsy, lower outer quadrant - FIBROADENOMA.- FIBROCYSTIC CHANGES WITH USUAL DUCTAL HYPERPLASIA.- NO ATYPIA OR MALIGNANCY IDENTIFIED Diagnosis 07/13/15: 1. Breast, left, needle core biopsy, left upper inner quadrant - INVASIVE DUCTAL CARCINOMA.- LOBULAR NEOPLASIA (ATYPICAL LOBULAR HYPERPLASIA).- FIBROCYSTIC CHANGES WITH ADENOSIS AND CALCIFICATIONS.- SEE COMMENT. 2. Breast, right, needle core biopsy, 8:00 o'clock, 12 CMFN - FIBROCYSTIC CHANGES WITH ADENOSIS AND CALCIFICATIONS.- THERE IS NO EVIDENCE OF MALIGNANCY  Receptor Status: ER(), PR (), Her2-neu Ki-)  Did patient present with symptoms (if so, please note symptoms) or was this found on screening mammography?:   Past/Anticipated interventions by surgeon, if EXM:[id number]:4/73/40:ZJ. Ralene Muskrat 1. Breast, lumpectomy, Left - FOCAL INVASIVE DUCTAL CARCINOMA, GRADE 1, SEE COMMENT.- ATYPICAL DUCTAL HYPERPLASIA.- LOBULAR NEOPLASIA (LOBULAR CARCINOMA IN SITU). - RESECTION MARGINS ARE NEGATIVE FOR INVASIVE CARCINOMA.- FIBROCYSTIC CHANGE AND USUAL DUCTAL HYPERPLASIA WITH CALCIFICATIONS.- BIOPSY SITE CHANGES. - SEE ONCOLOGY TABLE. 2. Lymph node, sentinel, biopsy, Left Axillary #1 - ONE OF ONE LYMPH NODE NEGATIVE FOR CARCINOMA (0/1). 3. Lymph node, sentinel, biopsy, Left Axillary #2 - ONE OF ONE LYMPH NODE NEGATIVE FOR CARCINOMA (0/1).  Lymphedema issues, if any:   NO  Pain issues, if any:  In chest from bronchitis and lower back,  From extensive surgery done in December 2016 SAFETY ISSUES:  Prior radiation?  No  Pacemaker/ICD? NO  Possible current pregnancy?NO  Is the patient on methotrexate? NO  Current Complaints / other details:  Has acute bronchitis  Started on Levaquin yesterday, 09/29/15   Allergies:Ampicillin=hives,rash, PCNS, Codeine BP 118/60 mmHg  Pulse 106  Temp(Src) 97.4 F (36.3  C) (Oral)  Resp 20  Ht _0  (1.727 m)  Wt 246 lb 6.4 oz (111.766 kg)  BMI 37.47 kg/m2  SpO2 100%  Wt Readings from Last 3 Encounters:  09/30/15 246 lb 6.4 oz (111.766 kg)  09/29/15 246 lb (111.585 kg)  09/16/15 244 lb (110.678 kg)   Rebecca Eaton, RN 09/24/2015,1:36 PM

## 2015-09-29 ENCOUNTER — Encounter: Payer: Self-pay | Admitting: Family Medicine

## 2015-09-29 ENCOUNTER — Ambulatory Visit (INDEPENDENT_AMBULATORY_CARE_PROVIDER_SITE_OTHER): Payer: Commercial Managed Care - HMO | Admitting: Family Medicine

## 2015-09-29 VITALS — BP 100/64 | HR 100 | Temp 97.9°F | Wt 246.0 lb

## 2015-09-29 DIAGNOSIS — Z87891 Personal history of nicotine dependence: Secondary | ICD-10-CM

## 2015-09-29 DIAGNOSIS — J449 Chronic obstructive pulmonary disease, unspecified: Secondary | ICD-10-CM

## 2015-09-29 DIAGNOSIS — C50212 Malignant neoplasm of upper-inner quadrant of left female breast: Secondary | ICD-10-CM

## 2015-09-29 DIAGNOSIS — J209 Acute bronchitis, unspecified: Secondary | ICD-10-CM | POA: Diagnosis not present

## 2015-09-29 MED ORDER — LEVOFLOXACIN 500 MG PO TABS: 500.0000 mg | ORAL_TABLET | Freq: Every day | ORAL | Status: DC

## 2015-09-29 NOTE — Progress Notes (Signed)
Subjective:     Patient ID: Brandi Stephens, female   DOB: 1945-05-18, 70 y.o.   MRN: [id number]  HPI [name] has a medical history significant for COPD, pneumonia requiring hospitalization ~4-5 months ago and outpatient surgery for invasive ductal carcinoma 2-3 weeks ago who presents to clinic with a 10 day history of rhinorrhea, nasal congestion, and sore throat that has progressed over the past couple of days to deep productive cough with yellow mucus, chest congestion, shortness of breath, and generalized weakness.  Multiple immediate family members have been sick recently. She denies fever, chest pain, sinus pressure, and ear pain.   She has tried OTC medications including mucinex and nyquil which have helped with symptoms. She has seasonal allergies controlled on as needed allegra, has spiriva and flovent for her COPD and is a ~50 pack year former smoker.   Her breast cancer care is coordinated with Dr. Jana Hakim.    Review of Systems     Objective:   Physical Exam  Alert, in no distress, and without significantly labored breathing.   Tympanic membranes and canals are normal.  Pharyngeal area and nasal mucosa is normal and non-erythematous. Neck is supple without adenopathy or thyromegaly.  Cardiac exam shows a regular sinus rhythm without murmurs or gallops.  Lungs are clear to auscultation. No dullness to percussion.  94% on RA    Assessment / Plan:     Acute bronchitis, unspecified organism - Plan: levofloxacin (LEVAQUIN) 500 MG tablet  Breast cancer of upper-inner quadrant of left female breast ([place])  Chronic obstructive pulmonary disease, unspecified COPD type ([place])  Former smoker Will treat with 10 day course of levafloxacin.  She will call back if symptoms do not completely resolve by the end of the abx course.   Discussed her recent history of smoking and caution her about starting again based on stress and the possibility of having just one cigarette being  precipitating factors.     History and physical exam conducted by Marylen Ponto (Medical Student) in conjunction with Dr. Redmond School.

## 2015-09-29 NOTE — Patient Instructions (Addendum)
Take all the antibiotic and if not totally back to normal when you finish call us

## 2015-09-30 ENCOUNTER — Ambulatory Visit
Admission: RE | Admit: 2015-09-30 | Discharge: 2015-09-30 | Disposition: A | Discharge status: Home / Self Care | Payer: Commercial Managed Care - HMO | Source: Ambulatory Visit | Attending: Radiation Oncology | Admitting: Radiation Oncology

## 2015-09-30 ENCOUNTER — Encounter: Payer: Self-pay | Admitting: Radiation Oncology

## 2015-09-30 VITALS — BP 118/60 | HR 106 | Temp 97.4°F | Resp 20 | Ht 68.0 in | Wt 246.4 lb

## 2015-09-30 DIAGNOSIS — Z510 Encounter for antineoplastic radiation therapy: Secondary | ICD-10-CM | POA: Insufficient documentation

## 2015-09-30 DIAGNOSIS — C50212 Malignant neoplasm of upper-inner quadrant of left female breast: Secondary | ICD-10-CM

## 2015-09-30 DIAGNOSIS — C50211 Malignant neoplasm of upper-inner quadrant of right female breast: Secondary | ICD-10-CM

## 2015-09-30 NOTE — Progress Notes (Signed)
Please see the Nurse Progress Note in the MD Initial Consult Encounter for this patient. 

## 2015-09-30 NOTE — Progress Notes (Signed)
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 She initially was seen in breast clinic on 07/22/15 after undergoing a mammogram to evaluate breast pain in March 2017. Distortion was noted in the left breast and was consistent with ER/PR positive invasive ductal carcionma on  Biopsy. She was dispositioned to bilateral breast MRI on 07/29/15 which showed an area of rim enhancement around the biopsy clip in the upper inner quadrant of the left breast. This measured up to 1.4 cm and was felt to be postbiopsy change. The MRI also showed a few scattered cysts in the right breast, a 0.6 cm enhancing mass with slightly irregular margins in the lower central right breast (possibly representing a small fibroadenoma or intramammary lymph node), and an enhancing lesion in the skin of the lower outer right breast (that is likely a sebaceous cyst). Ultrasound of the right breast and axilla on 08/11/15 did not definitively correlate for the 0.6 cm enhancing mass on the breast MRI. On 08/24/15, biopsy of the lower outer quadrant revealed fibroadenoma, fibrocystic changes with usual ductal hyperplasia, and no atypia or malignancy was identified.  On 09/16/15, the patient had a lumpectomy of the left breast and sentinel lymph node assessment. Lumpectomy revealed a grade 1 focal invasive ductal carcinoma measuring 0.4 cm  and 0.2 cm residual in the current resection, ADH, LCIS, fibrocystic change, and usual ductal hyperplasia (ER/PR positive, HER2 negative). The resection margins were negative. Two  axillary lymph  nodes were negative. The patient presents today to discuss the role of radiation in the management of her disease.  ALLERGIES:  is allergic to ampicillin; codeine; morphine and related; and penicillins.  Meds: Current Outpatient Prescriptions  Medication Sig Dispense Refill  . allopurinol (ZYLOPRIM) 100 MG tablet Take 2 tablets (200 mg total) by mouth daily. 90 tablet 1  . atorvastatin (LIPITOR) 20 MG tablet Take 1 tablet (20 mg total) by mouth daily. 90 tablet 1  . fluticasone (FLOVENT HFA) 110 MCG/ACT inhaler Inhale 2 puffs into the lungs 2 (two) times daily. 3 Inhaler 3  . levofloxacin (LEVAQUIN) 500 MG tablet Take 1 tablet (500 mg total) by mouth daily. 10 tablet 0  . levothyroxine (SYNTHROID, LEVOTHROID) 75 MCG tablet Take 1 tablet (75 mcg total) by mouth daily. 90 tablet 1  . MAGNESIUM CITRATE PO Take 2 tablets by mouth at bedtime. Reported on 09/29/2015    . primidone (MYSOLINE) 50 MG tablet Take 0.5 tablets (25 mg total) by mouth at bedtime. 45 tablet 1  . rOPINIRole (REQUIP) 0.25 MG tablet Take 1 tablet (0.25 mg total) by mouth at bedtime. 90 tablet 1  . tiotropium (SPIRIVA) 18 MCG inhalation capsule Place 1 capsule (18 mcg total) into inhaler and inhale daily. 90 capsule 3  . traMADol (ULTRAM) 50 MG tablet Take 1 tablet (50 mg total) by mouth every 6 (six) hours as needed. 90 tablet 0  . valsartan-hydrochlorothiazide (DIOVAN-HCT) 320-12.5 MG tablet Take 1 tablet by mouth daily. 90 tablet 1  . furosemide (LASIX) 20 MG tablet Take 20 mg by mouth at bedtime. Reported on 09/30/2015    .  KLOR-CON 10 10 MEQ tablet Take 10 mEq by mouth as needed. Reported on 09/30/2015    . oxyCODONE (OXY IR/ROXICODONE) 5 MG immediate release tablet Take 1-2 tablets (5-10 mg total) by mouth every 6 (six) hours as needed for moderate pain, severe pain or breakthrough pain. (Patient not taking: Reported on 09/30/2015) 30 tablet 0   No current facility-administered medications for this encounter.    Physical  Findings:  height is '5\' 8"'  (1.727 m) and weight is 246 lb 6.4 oz (111.766 kg). Her oral temperature is 97.4 F (36.3 C). Her blood pressure is 118/60 and her pulse is 106. Her respiration is 20 and oxygen saturation is 100%.  In general this is a well appearing Caucasian female in no acute distress. She's alert and oriented x4 and appropriate throughout the examination. Cardiopulmonary assessment is negative for acute distress and she exhibits normal effort. Postsurgical changes from 12-2 o'clock along the areola of the left breast are noted. She also has a left axillary incision that is healing well. Neither shows cellulitic changes, bleeding, or separation. No edema is present.  Lab Findings: Lab Results  Component Value Date   WBC 13.8* 08/26/2015   HGB 14.6 08/26/2015   HCT 44.5 08/26/2015   MCV 93.1 08/26/2015   PLT 313 08/26/2015     Radiographic Findings: Nm Sentinel Node Inj-no Rpt (breast)  09/16/2015  CLINICAL DATA: left breast cancer Sulfur colloid was injected intradermally by the nuclear medicine technologist for breast cancer sentinel node localization.   Mm Breast Surgical Specimen  09/16/2015  CLINICAL DATA:  Status post left breast lumpectomy today after earlier radioactive seed localization. EXAM: SPECIMEN RADIOGRAPH OF THE LEFT BREAST COMPARISON:  Previous exam(s). FINDINGS: Status post excision of the left breast. The radioactive seed and biopsy marker clip are present, completely intact, and were marked for pathology. The positions of the radioactive seed and biopsy clip within the specimen were discussed with the OR staff during the procedure. IMPRESSION: Specimen radiograph of the left breast. Electronically Signed   By: Franki Cabot M.D.   On: 09/16/2015 13:27   Mm Lt Radioactive Seed Loc Mammo Guide  09/15/2015  CLINICAL DATA:  Patient for preoperative localization prior to left breast lumpectomy. EXAM: MAMMOGRAPHIC GUIDED RADIOACTIVE SEED LOCALIZATION OF THE LEFT  BREAST COMPARISON:  Previous exam(s). FINDINGS: Patient presents for radioactive seed localization prior to left breast lumpectomy. I met with the patient and we discussed the procedure of seed localization including benefits and alternatives. We discussed the high likelihood of a successful procedure. We discussed the risks of the procedure including infection, bleeding, tissue injury and further surgery. We discussed the low dose of radioactivity involved in the procedure. Informed, written consent was given. The usual time-out protocol was performed immediately prior to the procedure. Using mammographic guidance, sterile technique, 1% lidocaine and an I-125 radioactive seed, biopsy marking clip within the medial left breast was localized using a cranial approach. The follow-up mammogram images confirm the seed in the expected location and were marked for Dr. Barry Dienes. Follow-up survey of the patient confirms presence of the radioactive seed. Order number of I-125 seed:  [id number]. Total activity:  7.124 millicuries  Reference Date: 09/17/2015 The patient tolerated the procedure well and was released from the [facility]. She was given instructions regarding seed removal. IMPRESSION: Radioactive seed localization left breast. No apparent complications. Electronically Signed   By: Lovey Newcomer M.D.   On: 09/15/2015 14:09    Impression: Stage Ia, ER/PR positive pT1a, pN0  invasive ductal carcinoma of the left breast  Plan: Dr. [name] discusses the pathology findings and reviews the nature of the patient's disease. He reviews her final pathology from her lumpectomy and recommends proceeding with radiotherapy. He discusses that he would recommend 20 fractions to the left breast over 4 weeks time. He reviews the logistics of radiotherapy delivery in addition to simulation. We discussed the risks, benefits, short and long term effects of radiotherapy and the patient is interested in proceeding. Written consent is  obtained, and her simulation  is scheduled for 10/12/15.   Carola Rhine, PAC  This document serves as a record of services personally performed by Shona Simpson, PA-C and Kyung Rudd, MD. It was created on their behalf by Darcus Austin, a trained medical scribe. The creation of this record is based on the scribe's personal observations and the providers' statements to them. This document has been checked and approved by the attending provider.

## 2015-10-01 ENCOUNTER — Ambulatory Visit: Payer: Commercial Managed Care - HMO | Admitting: Psychiatry

## 2015-10-06 ENCOUNTER — Other Ambulatory Visit: Payer: Self-pay | Admitting: Family Medicine

## 2015-10-12 ENCOUNTER — Ambulatory Visit
Admission: RE | Admit: 2015-10-12 | Discharge: 2015-10-12 | Disposition: A | Discharge status: Home / Self Care | Payer: Commercial Managed Care - HMO | Source: Ambulatory Visit | Attending: Radiation Oncology | Admitting: Radiation Oncology

## 2015-10-12 ENCOUNTER — Ambulatory Visit
Admission: RE | Admit: 2015-10-12 | Payer: Commercial Managed Care - HMO | Source: Ambulatory Visit | Admitting: Radiation Oncology

## 2015-10-13 ENCOUNTER — Telehealth: Payer: Self-pay | Admitting: Radiation Oncology

## 2015-10-13 ENCOUNTER — Telehealth: Payer: Self-pay

## 2015-10-13 NOTE — Telephone Encounter (Signed)
i could not find a [employer] but found a Brandi Stephens. I have done a referral for pt seeing Brandi Stephens, per rebecca patient didn't know the phone number to the psych she was going to.   Approval # [id number]

## 2015-10-13 NOTE — Telephone Encounter (Signed)
I called to see if pt wanted to come in any earlier for sim tomorrow but she is unable due to other appts.

## 2015-10-13 NOTE — Telephone Encounter (Signed)
Pt needs [place] to check authorization for her appt with Dr. Arvin Collard Psychiatrist. She has appt today at 10:30.   My log in is not responding so I am not able to check on this.   Please advise. Victorino December

## 2015-10-14 ENCOUNTER — Ambulatory Visit
Admission: RE | Admit: 2015-10-14 | Discharge: 2015-10-14 | Disposition: A | Discharge status: Home / Self Care | Payer: Commercial Managed Care - HMO | Source: Ambulatory Visit | Attending: Radiation Oncology | Admitting: Radiation Oncology

## 2015-10-14 DIAGNOSIS — Z510 Encounter for antineoplastic radiation therapy: Secondary | ICD-10-CM | POA: Diagnosis not present

## 2015-10-14 DIAGNOSIS — C50212 Malignant neoplasm of upper-inner quadrant of left female breast: Secondary | ICD-10-CM

## 2015-10-15 ENCOUNTER — Ambulatory Visit (INDEPENDENT_AMBULATORY_CARE_PROVIDER_SITE_OTHER): Payer: Commercial Managed Care - HMO | Admitting: Psychiatry

## 2015-10-15 DIAGNOSIS — F4323 Adjustment disorder with mixed anxiety and depressed mood: Secondary | ICD-10-CM

## 2015-10-16 DIAGNOSIS — Z510 Encounter for antineoplastic radiation therapy: Secondary | ICD-10-CM | POA: Diagnosis not present

## 2015-10-21 ENCOUNTER — Ambulatory Visit
Admission: RE | Admit: 2015-10-21 | Discharge: 2015-10-21 | Disposition: A | Discharge status: Home / Self Care | Payer: Commercial Managed Care - HMO | Source: Ambulatory Visit | Attending: Radiation Oncology | Admitting: Radiation Oncology

## 2015-10-21 DIAGNOSIS — Z510 Encounter for antineoplastic radiation therapy: Secondary | ICD-10-CM | POA: Diagnosis not present

## 2015-10-21 DIAGNOSIS — C50212 Malignant neoplasm of upper-inner quadrant of left female breast: Secondary | ICD-10-CM

## 2015-10-21 MED ORDER — ALRA NON-METALLIC DEODORANT (RAD-ONC)
1.0000 "application " | Freq: Once | TOPICAL | Status: AC
  Administered 2015-10-21: 1 via TOPICAL

## 2015-10-21 MED ORDER — RADIAPLEXRX EX GEL
Freq: Once | CUTANEOUS | Status: AC
  Administered 2015-10-21: 12:00:00 via TOPICAL

## 2015-10-21 NOTE — Progress Notes (Signed)
pateint education done, radiation there pay and you book, my business card, alra and radiaplex gel given ,discussed ways to monitor side effects, fatigue, skin irritation, swelling,soreness breast,increase protein in diet,. Use of electric razor if needing to shave under arms,drink plenty fluids, sees Conservation officer, historic buildings and MD weekly and prn, verbal understanding, teach back given 12:24 PM

## 2015-10-22 ENCOUNTER — Ambulatory Visit
Admission: RE | Admit: 2015-10-22 | Discharge: 2015-10-22 | Disposition: A | Discharge status: Home / Self Care | Payer: Commercial Managed Care - HMO | Source: Ambulatory Visit | Attending: Radiation Oncology | Admitting: Radiation Oncology

## 2015-10-22 ENCOUNTER — Ambulatory Visit: Payer: Commercial Managed Care - HMO | Admitting: Psychiatry

## 2015-10-22 DIAGNOSIS — Z510 Encounter for antineoplastic radiation therapy: Secondary | ICD-10-CM | POA: Diagnosis not present

## 2015-10-23 ENCOUNTER — Ambulatory Visit
Admission: RE | Admit: 2015-10-23 | Discharge: 2015-10-23 | Disposition: A | Discharge status: Home / Self Care | Payer: Commercial Managed Care - HMO | Source: Ambulatory Visit | Attending: Radiation Oncology | Admitting: Radiation Oncology

## 2015-10-23 ENCOUNTER — Encounter: Payer: Self-pay | Admitting: Radiation Oncology

## 2015-10-23 VITALS — BP 128/74 | HR 109 | Temp 98.1°F | Resp 20 | Wt 247.1 lb

## 2015-10-23 DIAGNOSIS — Z510 Encounter for antineoplastic radiation therapy: Secondary | ICD-10-CM | POA: Diagnosis not present

## 2015-10-23 DIAGNOSIS — C50212 Malignant neoplasm of upper-inner quadrant of left female breast: Secondary | ICD-10-CM

## 2015-10-23 NOTE — Progress Notes (Signed)
Department of Radiation Oncology  Phone:  [phone] Fax:        [phone]  Weekly Treatment Note    Name: Brandi Stephens Date: 10/23/2015 MRN: [id number] DOB: 26-Jun-1945   Diagnosis:     ICD-9-CM ICD-10-CM   1. Breast cancer of upper-inner quadrant of left female breast ([facility]) 174.2 C50.212      Current dose: 5 Gy  Current fraction: 2   MEDICATIONS: Current Outpatient Prescriptions  Medication Sig Dispense Refill  . allopurinol (ZYLOPRIM) 100 MG tablet Take 2 tablets (200 mg total) by mouth daily. 90 tablet 1  . atorvastatin (LIPITOR) 20 MG tablet Take 1 tablet (20 mg total) by mouth daily. 90 tablet 1  . fluticasone (FLOVENT HFA) 110 MCG/ACT inhaler Inhale 2 puffs into the lungs 2 (two) times daily. 3 Inhaler 3  . furosemide (LASIX) 20 MG tablet Take 20 mg by mouth at bedtime. Reported on 10/23/2015    . KLOR-CON 10 10 MEQ tablet Take 10 mEq by mouth as needed. Reported on 09/30/2015    . levothyroxine (SYNTHROID, LEVOTHROID) 75 MCG tablet Take 1 tablet (75 mcg total) by mouth daily. 90 tablet 1  . MAGNESIUM CITRATE PO Take 2 tablets by mouth at bedtime. Reported on 09/29/2015    . primidone (MYSOLINE) 50 MG tablet Take 0.5 tablets (25 mg total) by mouth at bedtime. 45 tablet 1  . rOPINIRole (REQUIP) 0.25 MG tablet Take 1 tablet (0.25 mg total) by mouth at bedtime. 90 tablet 1  . tiotropium (SPIRIVA) 18 MCG inhalation capsule Place 1 capsule (18 mcg total) into inhaler and inhale daily. 90 capsule 3  . traMADol (ULTRAM) 50 MG tablet Take 1 tablet (50 mg total) by mouth every 6 (six) hours as needed. 90 tablet 0  . valsartan-hydrochlorothiazide (DIOVAN-HCT) 320-12.5 MG tablet Take 1 tablet by mouth daily. 90 tablet 1  . oxyCODONE (OXY IR/ROXICODONE) 5 MG immediate release tablet Take 1-2 tablets (5-10 mg total) by mouth every 6 (six) hours as needed for moderate pain, severe pain or breakthrough pain. (Patient not taking: Reported on 09/30/2015) 30 tablet 0   No current  facility-administered medications for this encounter.     ALLERGIES: Ampicillin; Codeine; Morphine and related; and Penicillins   LABORATORY DATA:  Lab Results  Component Value Date   WBC 13.8* 08/26/2015   HGB 14.6 08/26/2015   HCT 44.5 08/26/2015   MCV 93.1 08/26/2015   PLT 313 08/26/2015   Lab Results  Component Value Date   NA 141 08/26/2015   K 5.1 08/26/2015   CL 109 08/26/2015   CO2 23 08/26/2015   Lab Results  Component Value Date   ALT 13 07/22/2015   AST 13 07/22/2015   ALKPHOS 83 07/22/2015   BILITOT 0.30 07/22/2015     NARRATIVE: [name] was seen today for weekly treatment management. The chart was checked and the patient's films were reviewed.  Weekly rad txs left breast 2/20 completed. No skin changes. Using radiaplex bid. Appetite good. She is doing well with treatment. Her only complaint is her left rotator cuff and plans to put a pain patch on it.   PHYSICAL EXAMINATION: weight is 247 lb 1.6 oz (112.084 kg). Her oral temperature is 98.1 F (36.7 C). Her blood pressure is 128/74 and her pulse is 109. Her respiration is 20.      Alert and in no acute distress.  ASSESSMENT: The patient is doing satisfactorily with treatment.  PLAN: We will continue with the patient's  radiation treatment as planned.    ------------------------------------------------  Jodelle Gross, MD, PhD    This document serves as a record of services personally performed by Kyung Rudd, MD and Shona Simpson, PA-C. It was created on his behalf by Arlyce Harman, a trained medical scribe. The creation of this record is based on the scribe's personal observations and the provider's statements to them. This document has been checked and approved by the attending provider.

## 2015-10-23 NOTE — Progress Notes (Signed)
Weekly rad txs left breast 2/20 completed  No skin changes, using radiaplex bid, no c/o appetite good BP 128/74 mmHg  Pulse 109  Temp(Src) 98.1 F (36.7 C) (Oral)  Resp 20  Wt 247 lb 1.6 oz (112.084 kg)  Wt Readings from Last 3 Encounters:  10/23/15 247 lb 1.6 oz (112.084 kg)  09/30/15 246 lb 6.4 oz (111.766 kg)  09/29/15 246 lb (111.585 kg)

## 2015-10-26 ENCOUNTER — Ambulatory Visit
Admission: RE | Admit: 2015-10-26 | Discharge: 2015-10-26 | Disposition: A | Discharge status: Home / Self Care | Payer: Commercial Managed Care - HMO | Source: Ambulatory Visit | Attending: Radiation Oncology | Admitting: Radiation Oncology

## 2015-10-26 DIAGNOSIS — Z510 Encounter for antineoplastic radiation therapy: Secondary | ICD-10-CM | POA: Diagnosis not present

## 2015-10-27 ENCOUNTER — Ambulatory Visit
Admission: RE | Admit: 2015-10-27 | Discharge: 2015-10-27 | Disposition: A | Discharge status: Home / Self Care | Payer: Commercial Managed Care - HMO | Source: Ambulatory Visit | Attending: Radiation Oncology | Admitting: Radiation Oncology

## 2015-10-27 DIAGNOSIS — Z510 Encounter for antineoplastic radiation therapy: Secondary | ICD-10-CM | POA: Diagnosis not present

## 2015-10-28 ENCOUNTER — Encounter: Payer: Self-pay | Admitting: Family Medicine

## 2015-10-28 ENCOUNTER — Ambulatory Visit (INDEPENDENT_AMBULATORY_CARE_PROVIDER_SITE_OTHER): Payer: Commercial Managed Care - HMO | Admitting: Family Medicine

## 2015-10-28 ENCOUNTER — Ambulatory Visit
Admission: RE | Admit: 2015-10-28 | Discharge: 2015-10-28 | Disposition: A | Discharge status: Home / Self Care | Payer: Commercial Managed Care - HMO | Source: Ambulatory Visit | Attending: Radiation Oncology | Admitting: Radiation Oncology

## 2015-10-28 ENCOUNTER — Other Ambulatory Visit: Payer: Self-pay | Admitting: Family Medicine

## 2015-10-28 ENCOUNTER — Ambulatory Visit: Payer: Self-pay | Admitting: Family Medicine

## 2015-10-28 VITALS — BP 124/70 | HR 64 | Wt 244.2 lb

## 2015-10-28 DIAGNOSIS — Z510 Encounter for antineoplastic radiation therapy: Secondary | ICD-10-CM | POA: Diagnosis not present

## 2015-10-28 DIAGNOSIS — R05 Cough: Secondary | ICD-10-CM

## 2015-10-28 DIAGNOSIS — R61 Generalized hyperhidrosis: Secondary | ICD-10-CM | POA: Insufficient documentation

## 2015-10-28 DIAGNOSIS — I10 Essential (primary) hypertension: Secondary | ICD-10-CM

## 2015-10-28 DIAGNOSIS — R609 Edema, unspecified: Secondary | ICD-10-CM | POA: Diagnosis not present

## 2015-10-28 DIAGNOSIS — R059 Cough, unspecified: Secondary | ICD-10-CM

## 2015-10-28 DIAGNOSIS — R053 Chronic cough: Secondary | ICD-10-CM | POA: Insufficient documentation

## 2015-10-28 NOTE — Telephone Encounter (Signed)
Is this okay to refill? 

## 2015-10-28 NOTE — Progress Notes (Signed)
   Subjective:    Patient ID: Brandi Stephens, female    DOB: [birth year], 70 y.o.   MRN: [id number]  HPI Chief Complaint  Patient presents with  . 3 month follow-up    3 month follow-up   She is here for a 3 month follow-up on hypertension and states blood pressures have been normal. No issues with her medications.   Complains of facial sweating for past few weeks and seems to be getting worse. States she has history of this and has taken medication for it, a  topical med Drysol Dab-o-matic and oral Robinul 1 mg every 3rd day or so. States she also knows of an over the counter topical medication now for this but has not tried it.   States she saw Dr. Redmond School for cough, congestion and hoarseness, took a course of Levaquin and improved except for lingering cough occasionally productive.  Has Allegra and curious if she should start taking it again.  Denies fever, chills, chest pain, shortness of breath, orthopnea, abdominal pain, nausea, vomiting, diarrhea, or urinary symptoms.  Reports Requip has helped restless leg and leg discomfort.   She started radiation last week and is going M-F for next 4 weeks.  States her lymph nodes biopsies came back benign. Stage 1 grade 1 breast cancer. She feels very fortunate.   States she can never take hormones again due to type of cancer. States she will go through menopause again. Dr. Jana Hakim will supervise this.      Review of Systems Pertinent positives and negatives in the history of present illness.     Objective:   Physical Exam  Constitutional: She appears well-developed and well-nourished. No distress.  Cardiovascular: Normal rate, regular rhythm, normal heart sounds and intact distal pulses.  Exam reveals no gallop and no friction rub.   No murmur heard. Mild edema to left foot, non pitting  Pulmonary/Chest: Effort normal and breath sounds normal.   BP 124/70 mmHg  Pulse 64  Wt 244 lb 3.2 oz (110.768 kg)     Assessment & Plan:    Essential hypertension  Dependent edema  Cough  Craniofacial hyperhidrosis  Discussed that her blood pressure is within goal range and continue on current medication regimen.  Suspect that her cough may be related to post nasal drainage and allergies. She will try Allegra and see if she notices improvement with cough. Discussed that she reports feeling much better after course of Levoquin except for residual cough.  if cough becomes more productive or if she develops fever, shortness of breath or worsening symptoms then she will call and I will order a chest XR.  Discussed that I prefer that she use topical Drysol for her facial sweating and see if this helps instead of a systemic medication at this point. She will let me know if she plans to purchase medication otc or if she would like a prescription.  Edema continues to improve but does still have some to left foot. Pain has improved and restless leg symptoms much better on Requip. No change to medication or treatment.  She is going to appointment for radiation for breast cancer treatment after this visit and has appointment with oncologist in 2 days. She will find out if they plan to order labs and if not she will let me know and I will order labs and have her return next week fasting. Plan to repeat CBC, CMP, LIPIDS (if she will fast), TSH (for craniofacial hyperhidrosis).

## 2015-10-28 NOTE — Telephone Encounter (Signed)
Ok to refill   thanks

## 2015-10-29 ENCOUNTER — Telehealth: Payer: Self-pay | Admitting: *Deleted

## 2015-10-29 ENCOUNTER — Telehealth: Payer: Self-pay | Admitting: Family Medicine

## 2015-10-29 ENCOUNTER — Other Ambulatory Visit: Payer: Self-pay | Admitting: Family Medicine

## 2015-10-29 ENCOUNTER — Ambulatory Visit
Admission: RE | Admit: 2015-10-29 | Discharge: 2015-10-29 | Disposition: A | Discharge status: Home / Self Care | Payer: Commercial Managed Care - HMO | Source: Ambulatory Visit | Attending: Radiation Oncology | Admitting: Radiation Oncology

## 2015-10-29 DIAGNOSIS — R61 Generalized hyperhidrosis: Secondary | ICD-10-CM

## 2015-10-29 DIAGNOSIS — Z510 Encounter for antineoplastic radiation therapy: Secondary | ICD-10-CM | POA: Diagnosis not present

## 2015-10-29 MED ORDER — ALUMINUM CHLORIDE 20 % EX SOLN: Freq: Every day | CUTANEOUS | Status: DC

## 2015-10-29 NOTE — Telephone Encounter (Signed)
Let her know that I sent in prescription for topical medication. Let me know if this is not helping.

## 2015-10-29 NOTE — Telephone Encounter (Signed)
Pt called stating that she will need the prescription med that she discussed with Vickie instead of the OTC and pt wants this sent to [facility] at [facility]

## 2015-10-29 NOTE — Telephone Encounter (Signed)
  Oncology Nurse Navigator Documentation    Navigator Encounter Type: Telephone (10/29/15 1800) Telephone: Lupus Call (10/29/15 1800)     Surgery Date: 09/16/15 (10/29/15 1800) Treatment Initiated Date: 09/16/15 (10/29/15 1800) Patient Visit Type: RadOnc (10/29/15 1800) Treatment Phase: First Radiation Tx (10/29/15 1800) Barriers/Navigation Needs: No barriers at this time;No Questions;No Needs (10/29/15 1800)   Interventions: None required (10/29/15 1800)            Acuity: Level 1 (10/29/15 1800)         Time Spent with Patient: 15 (10/29/15 1800)

## 2015-10-29 NOTE — Progress Notes (Signed)
Called pharmacy and cancelled med

## 2015-10-29 NOTE — Telephone Encounter (Signed)
Pt was notified.  

## 2015-10-30 ENCOUNTER — Encounter: Payer: Self-pay | Admitting: Radiation Oncology

## 2015-10-30 ENCOUNTER — Ambulatory Visit
Admission: RE | Admit: 2015-10-30 | Discharge: 2015-10-30 | Disposition: A | Discharge status: Home / Self Care | Payer: Commercial Managed Care - HMO | Source: Ambulatory Visit | Attending: Radiation Oncology | Admitting: Radiation Oncology

## 2015-10-30 VITALS — BP 132/72 | HR 101 | Temp 98.3°F | Resp 18 | Wt 245.6 lb

## 2015-10-30 DIAGNOSIS — Z510 Encounter for antineoplastic radiation therapy: Secondary | ICD-10-CM | POA: Diagnosis not present

## 2015-10-30 DIAGNOSIS — C50212 Malignant neoplasm of upper-inner quadrant of left female breast: Secondary | ICD-10-CM

## 2015-10-30 NOTE — Progress Notes (Signed)
Weekly radiation breast 7/20 completed, very mild pink tinge to left breast near nopple, dryness there,  Occasional soreness stated, not sleeping well still, congestive cough, using radaiplex bid, skin is intact 3:21 PM BP 132/72 mmHg  Pulse 101  Temp(Src) 98.3 F (36.8 C) (Oral)  Resp 18  Wt 245 lb 9.6 oz (111.403 kg)

## 2015-10-31 ENCOUNTER — Encounter: Payer: Self-pay | Admitting: Radiation Oncology

## 2015-10-31 NOTE — Progress Notes (Signed)
Department of Radiation Oncology  Phone:  [phone] Fax:        [phone]  Weekly Treatment Note    Name: Brandi Stephens Date: 10/31/2015 MRN: [id number] DOB: [birth year]   Diagnosis:     ICD-9-CM ICD-10-CM   1. Breast cancer of upper-inner quadrant of left female breast (HCC) 174.2 C50.212      Current dose: 17.5 Gy  Current fraction: 7   MEDICATIONS: Current Outpatient Prescriptions  Medication Sig Dispense Refill  . allopurinol (ZYLOPRIM) 100 MG tablet TAKE 2 TABLETS EVERY DAY 180 tablet 1  . aluminum chloride (DRYSOL) 20 % external solution Apply topically at bedtime. 35 mL 0  . atorvastatin (LIPITOR) 20 MG tablet Take 1 tablet (20 mg total) by mouth daily. 90 tablet 1  . BIOTIN 5000 PO Take by mouth.    . fluticasone (FLOVENT HFA) 110 MCG/ACT inhaler Inhale 2 puffs into the lungs 2 (two) times daily. 3 Inhaler 3  . furosemide (LASIX) 20 MG tablet Take 20 mg by mouth at bedtime. Reported on 10/28/2015    . KLOR-CON 10 10 MEQ tablet Take 10 mEq by mouth as needed. Reported on 10/28/2015    . levothyroxine (SYNTHROID, LEVOTHROID) 75 MCG tablet Take 1 tablet (75 mcg total) by mouth daily. 90 tablet 1  . MAGNESIUM CITRATE PO Take 2 tablets by mouth at bedtime. Reported on 09/29/2015    . Melatonin-Pyridoxine (MELATONIN-B6 PO) Take 1 tablet by mouth at bedtime.    . primidone (MYSOLINE) 50 MG tablet Take 0.5 tablets (25 mg total) by mouth at bedtime. 45 tablet 1  . rOPINIRole (REQUIP) 0.25 MG tablet Take 1 tablet (0.25 mg total) by mouth at bedtime. 90 tablet 1  . tiotropium (SPIRIVA) 18 MCG inhalation capsule Place 1 capsule (18 mcg total) into inhaler and inhale daily. 90 capsule 3  . traMADol (ULTRAM) 50 MG tablet Take 1 tablet (50 mg total) by mouth every 6 (six) hours as needed. 90 tablet 0  . valsartan-hydrochlorothiazide (DIOVAN-HCT) 320-12.5 MG tablet Take 1 tablet by mouth daily. 90 tablet 1   No current facility-administered medications for this encounter.      ALLERGIES: Ampicillin; Codeine; Morphine and related; and Penicillins   LABORATORY DATA:  Lab Results  Component Value Date   WBC 13.8* 08/26/2015   HGB 14.6 08/26/2015   HCT 44.5 08/26/2015   MCV 93.1 08/26/2015   PLT 313 08/26/2015   Lab Results  Component Value Date   NA 141 08/26/2015   K 5.1 08/26/2015   CL 109 08/26/2015   CO2 23 08/26/2015   Lab Results  Component Value Date   ALT 13 07/22/2015   AST 13 07/22/2015   ALKPHOS 83 07/22/2015   BILITOT 0.30 07/22/2015     NARRATIVE: Brandi Stephens was seen today for weekly treatment management. The chart was checked and the patient's films were reviewed.  Weekly radiation breast 7/20 completed, very mild pink tinge to left breast near nopple, dryness there,  Occasional soreness stated, not sleeping well still, congestive cough, using radaiplex bid, skin is intact 8:23 PM BP 132/72 mmHg  Pulse 101  Temp(Src) 98.3 F (36.8 C) (Oral)  Resp 18  Wt 245 lb 9.6 oz (111.403 kg)  PHYSICAL EXAMINATION: weight is 245 lb 9.6 oz (111.403 kg). Her oral temperature is 98.3 F (36.8 C). Her blood pressure is 132/72 and her pulse is 101. Her respiration is 18.        ASSESSMENT: The patient is doing satisfactorily with  treatment.  PLAN: We will continue with the patient's radiation treatment as planned.

## 2015-11-02 ENCOUNTER — Ambulatory Visit
Admission: RE | Admit: 2015-11-02 | Discharge: 2015-11-02 | Disposition: A | Discharge status: Home / Self Care | Payer: Commercial Managed Care - HMO | Source: Ambulatory Visit | Attending: Radiation Oncology | Admitting: Radiation Oncology

## 2015-11-02 DIAGNOSIS — Z510 Encounter for antineoplastic radiation therapy: Secondary | ICD-10-CM | POA: Diagnosis not present

## 2015-11-03 ENCOUNTER — Ambulatory Visit
Admission: RE | Admit: 2015-11-03 | Discharge: 2015-11-03 | Disposition: A | Discharge status: Home / Self Care | Payer: Commercial Managed Care - HMO | Source: Ambulatory Visit | Attending: Radiation Oncology | Admitting: Radiation Oncology

## 2015-11-03 DIAGNOSIS — Z510 Encounter for antineoplastic radiation therapy: Secondary | ICD-10-CM | POA: Diagnosis not present

## 2015-11-04 ENCOUNTER — Ambulatory Visit
Admission: RE | Admit: 2015-11-04 | Discharge: 2015-11-04 | Disposition: A | Discharge status: Home / Self Care | Payer: Commercial Managed Care - HMO | Source: Ambulatory Visit | Attending: Radiation Oncology | Admitting: Radiation Oncology

## 2015-11-04 DIAGNOSIS — Z510 Encounter for antineoplastic radiation therapy: Secondary | ICD-10-CM | POA: Diagnosis not present

## 2015-11-05 ENCOUNTER — Ambulatory Visit
Admission: RE | Admit: 2015-11-05 | Discharge: 2015-11-05 | Disposition: A | Discharge status: Home / Self Care | Payer: Commercial Managed Care - HMO | Source: Ambulatory Visit | Attending: Radiation Oncology | Admitting: Radiation Oncology

## 2015-11-05 DIAGNOSIS — Z510 Encounter for antineoplastic radiation therapy: Secondary | ICD-10-CM | POA: Diagnosis not present

## 2015-11-06 ENCOUNTER — Ambulatory Visit
Admission: RE | Admit: 2015-11-06 | Discharge: 2015-11-06 | Disposition: A | Discharge status: Home / Self Care | Payer: Commercial Managed Care - HMO | Source: Ambulatory Visit | Attending: Radiation Oncology | Admitting: Radiation Oncology

## 2015-11-06 ENCOUNTER — Encounter: Payer: Self-pay | Admitting: Radiation Oncology

## 2015-11-06 VITALS — BP 135/86 | HR 107 | Temp 98.3°F | Resp 20 | Wt 247.1 lb

## 2015-11-06 DIAGNOSIS — C50212 Malignant neoplasm of upper-inner quadrant of left female breast: Secondary | ICD-10-CM

## 2015-11-06 DIAGNOSIS — Z510 Encounter for antineoplastic radiation therapy: Secondary | ICD-10-CM | POA: Diagnosis not present

## 2015-11-06 NOTE — Progress Notes (Signed)
Here before radd tx to left breast, mild erythema, there is a hardened are above niple satted it has gotten bigger and needs to be checked, has had 11 fx completed so far, using radiaplex bid, left foot still edematous 2:04 PM BP 135/86 mmHg  Pulse 107  Temp(Src) 98.3 F (36.8 C) (Oral)  Resp 20  Wt 247 lb 1.6 oz (112.084 kg)

## 2015-11-07 NOTE — Progress Notes (Signed)
Department of Radiation Oncology  Phone:  [phone] Fax:        [phone]  Weekly Treatment Note    Name: Brandi Stephens Date: 11/07/2015 MRN: [id number] DOB: January 17, 1946   Diagnosis:     ICD-9-CM ICD-10-CM   1. Breast cancer of upper-inner quadrant of left female breast (HCC) 174.2 C50.212      Current dose: 30 Gy  Current fraction: 12   MEDICATIONS: Current Outpatient Prescriptions  Medication Sig Dispense Refill  . pyridOXINE (VITAMIN B-6) 100 MG tablet Take 100 mg by mouth daily.    Marland Kitchen allopurinol (ZYLOPRIM) 100 MG tablet TAKE 2 TABLETS EVERY DAY 180 tablet 1  . aluminum chloride (DRYSOL) 20 % external solution Apply topically at bedtime. 35 mL 0  . atorvastatin (LIPITOR) 20 MG tablet Take 1 tablet (20 mg total) by mouth daily. 90 tablet 1  . BIOTIN 5000 PO Take 5,000 capsules by mouth daily.     . fluticasone (FLOVENT HFA) 110 MCG/ACT inhaler Inhale 2 puffs into the lungs 2 (two) times daily. 3 Inhaler 3  . furosemide (LASIX) 20 MG tablet Take 20 mg by mouth at bedtime. Reported on 10/28/2015    . KLOR-CON 10 10 MEQ tablet Take 10 mEq by mouth as needed. Reported on 10/28/2015    . levothyroxine (SYNTHROID, LEVOTHROID) 75 MCG tablet Take 1 tablet (75 mcg total) by mouth daily. 90 tablet 1  . MAGNESIUM CITRATE PO Take 2 tablets by mouth at bedtime. Reported on 09/29/2015    . Melatonin-Pyridoxine (MELATONIN-B6 PO) Take 1 tablet by mouth at bedtime.    . primidone (MYSOLINE) 50 MG tablet Take 0.5 tablets (25 mg total) by mouth at bedtime. 45 tablet 1  . rOPINIRole (REQUIP) 0.25 MG tablet Take 1 tablet (0.25 mg total) by mouth at bedtime. 90 tablet 1  . tiotropium (SPIRIVA) 18 MCG inhalation capsule Place 1 capsule (18 mcg total) into inhaler and inhale daily. 90 capsule 3  . traMADol (ULTRAM) 50 MG tablet Take 1 tablet (50 mg total) by mouth every 6 (six) hours as needed. 90 tablet 0  . valsartan-hydrochlorothiazide (DIOVAN-HCT) 320-12.5 MG tablet Take 1 tablet by  mouth daily. 90 tablet 1   No current facility-administered medications for this encounter.     ALLERGIES: Ampicillin; Codeine; Morphine and related; and Penicillins   LABORATORY DATA:  Lab Results  Component Value Date   WBC 13.8* 08/26/2015   HGB 14.6 08/26/2015   HCT 44.5 08/26/2015   MCV 93.1 08/26/2015   PLT 313 08/26/2015   Lab Results  Component Value Date   NA 141 08/26/2015   K 5.1 08/26/2015   CL 109 08/26/2015   CO2 23 08/26/2015   Lab Results  Component Value Date   ALT 13 07/22/2015   AST 13 07/22/2015   ALKPHOS 83 07/22/2015   BILITOT 0.30 07/22/2015     NARRATIVE: Xzandria W Yau was seen today for weekly treatment management. The chart was checked and the patient's films were reviewed.  Here before radd tx to left breast, mild erythema, there is a hardened are above niple satted it has gotten bigger and needs to be checked, has had 11 fx completed so far, using radiaplex bid, left foot still edematous 10:58 AM BP 135/86 mmHg  Pulse 107  Temp(Src) 98.3 F (36.8 C) (Oral)  Resp 20  Wt 247 lb 1.6 oz (112.084 kg)  PHYSICAL EXAMINATION: weight is 247 lb 1.6 oz (112.084 kg). Her oral temperature is 98.3 F (36.8 C).  Her blood pressure is 135/86 and her pulse is 107. Her respiration is 20.      The patient has some increased size/swelling in the seroma cavity region. No overlying significant erythema beyond what would be expected from her course of radiation treatment. No infection seen currently therefore.  ASSESSMENT: The patient is doing satisfactorily with treatment. The patient is doing well but has had an increase in the size of the seroma.  PLAN: We will continue with the patient's radiation treatment as planned. The patient will undergo an additional simulation on Monday to check her current planned versus this potential change in anatomy. We will see whether replanning the patient's current treatment and her boost treatment will be necessary.

## 2015-11-07 NOTE — Progress Notes (Signed)
  Radiation Oncology         [phone] ________________________________  Name: Brandi Stephens MRN: [id number]  Date: 10/14/2015  DOB: 1945-09-06   DIAGNOSIS:     ICD-9-CM ICD-10-CM   1. Breast cancer of upper-inner quadrant of left female breast ([place]) 174.2 C50.212     SIMULATION AND TREATMENT PLANNING NOTE  The patient presented for simulation prior to beginning her course of radiation treatment for her diagnosis of Left-sided breast cancer. The patient was placed in a supine position on a breast board. A customized vac-lock bag was constructed and this complex treatment device will be used on a daily basis during her treatment. In this fashion, a CT scan was obtained through the chest area and an isocenter was placed near the chest wall within the breast.  The patient will be planned to receive a course of radiation initially to a dose of 42.5 Gy. This will consist of a whole breast radiotherapy technique. To accomplish this, 2 customized blocks have been designed which will correspond to medial and lateral whole breast tangent fields. This treatment will be accomplished at 2.5 Gy per fraction. A forward planning technique will also be evaluated to determine if this approach improves the plan. It is anticipated that the patient will then receive a 7.5 Gy boost to the seroma cavity which has been contoured. This will be accomplished at 2.5 Gy per fraction.   This initial treatment will consist of a 3-D conformal technique. The seroma has been contoured as the primary target structure. Additionally, dose volume histograms of both this target as well as the lungs and heart will also be evaluated. Such an approach is necessary to ensure that the target area is adequately covered while the nearby critical  normal structures are adequately spared.  Plan:  The final anticipated total dose therefore will correspond to 50 Gy.    _______________________________   Jodelle Gross, MD, PhD

## 2015-11-07 NOTE — Addendum Note (Signed)
Encounter addended by: Kyung Rudd, MD on: 11/07/2015 11:03 AM<BR>     Documentation filed: Notes Section, Visit Diagnoses

## 2015-11-07 NOTE — Progress Notes (Signed)
The patient was seen with Dr. [name] as well during her under treatment assessment. During her treatment she has been found to have an enlarging seroma that has been followed since her surgery. She has not required intervention for this with general surgery. She reports this area continues to be firm and is slightly more sore in the past week. She is using tramadol for pain relief. She denies fevers, chills. She feels that there is some increased warmth of the skin during this past week as well.   Evaluation of the left breast reveals a deep seroma pocket that is about 6cm in greatest dimension, and I recall this area previously to have been about 3cm. Mild desquamation is noted of this skin without breakdown, and no cellulitic change is present. The inframammary fold is intact without breakdown or skin abnormality.    We will proceed with repeat Simulation on Monday prior to her next treatment. We will try to complete radiotherapy, and if her seroma becomes more symptomatic we would ask general surgery to evaluate. However we would prefer to complete therapy without interruption. The patient will keep [place] informed of any changes in this site.    Carola Rhine, PAC

## 2015-11-07 NOTE — Progress Notes (Signed)
Complex simulation note  Diagnosis: Left-sided breast cancer  Narrative The patient has initially been planned to receive a course of whole breast radiation to a dose of 42.5 Gy in 17 fractions. The patient will now receive an additional boost to the seroma cavity which has been contoured. This will correspond to a boost of 7.5 Gy at 2.5 Gy per fraction. To accomplish this, an additional 3 customized blocks have been designed for this purpose. A complex isodose plan is requested to ensure that the target area is adequately covered with radiation dose and that the nearby normal structures such as the lung are adequately spared. The patient's final total dose will be 50 Gy.  ------------------------------------------------  Rayven Hendrickson S. Son Barkan, MD, PhD 

## 2015-11-07 NOTE — Progress Notes (Signed)
  Radiation Oncology         [phone] ________________________________  Name: Brandi Stephens MRN: [id number]  Date: 10/14/2015  DOB: 01/08/1946  Optical Surface Tracking Plan:  Since intensity modulated radiotherapy (IMRT) and 3D conformal radiation treatment methods are predicated on accurate and precise positioning for treatment, intrafraction motion monitoring is medically necessary to ensure accurate and safe treatment delivery.  The ability to quantify intrafraction motion without excessive ionizing radiation dose can only be performed with optical surface tracking. Accordingly, surface imaging offers the opportunity to obtain 3D measurements of patient position throughout IMRT and 3D treatments without excessive radiation exposure.  I am ordering optical surface tracking for this patient's upcoming course of radiotherapy. ________________________________  Kyung Rudd, MD 11/07/2015 11:03 AM    Reference:   Ursula Alert, J, et al. Surface imaging-based analysis of intrafraction motion for breast radiotherapy patients.Journal of [place], n. 6, nov. 2014. ISSN [id number].   Available at: <http://www.jacmp.org/index.php/jacmp/article/view/4957>.

## 2015-11-09 ENCOUNTER — Ambulatory Visit
Admission: RE | Admit: 2015-11-09 | Discharge: 2015-11-09 | Disposition: A | Discharge status: Home / Self Care | Payer: Commercial Managed Care - HMO | Source: Ambulatory Visit | Attending: Radiation Oncology | Admitting: Radiation Oncology

## 2015-11-09 DIAGNOSIS — Z510 Encounter for antineoplastic radiation therapy: Secondary | ICD-10-CM | POA: Diagnosis not present

## 2015-11-09 DIAGNOSIS — C50212 Malignant neoplasm of upper-inner quadrant of left female breast: Secondary | ICD-10-CM

## 2015-11-09 NOTE — Progress Notes (Signed)
  Radiation Oncology         [phone] ________________________________  Name: Brandi Stephens MRN: [id number]  Date: 11/09/2015  DOB: 02/05/1946  SIMULATION AND TREATMENT PLANNING NOTE    ICD-9-CM ICD-10-CM   1. Breast cancer of upper-inner quadrant of left female breast ([place]) 174.2 C50.212     DIAGNOSIS:  70 yo woman with left-sided cancer of the UIQ breast  NARRATIVE:  The patient was brought to the [facility].  Identity was confirmed.  All relevant records and images related to the planned course of therapy were reviewed.   She has had enlargement of her seroma and requires re-planning to account for anatomic changes.  CT images were obtained.  Surface markings were placed.  The CT images were loaded into the planning software.    Her original plan will be re-applied to the new image set to determine if changes are required in the planned blocks/MLCs/radiation distribution.  ________________________________  Sheral Apley Tammi Klippel, M.D.    ADDENDUM: New CT fused into old radiation plan.  New seroma contour now measures 109 cc compared to 84 cc previously.  Tangent beams recalculated and subtraction of dose performed for review.  Difference in whole breast dosimetry ruled inconsequential by myself, physics staff, and dosimetry.  Consequently, we'll continue current beams.  We will however create a new boost plan to cover the larger seroma cavity.  MM

## 2015-11-10 ENCOUNTER — Ambulatory Visit
Admission: RE | Admit: 2015-11-10 | Discharge: 2015-11-10 | Disposition: A | Discharge status: Home / Self Care | Payer: Commercial Managed Care - HMO | Source: Ambulatory Visit | Attending: Radiation Oncology | Admitting: Radiation Oncology

## 2015-11-10 DIAGNOSIS — Z510 Encounter for antineoplastic radiation therapy: Secondary | ICD-10-CM | POA: Diagnosis not present

## 2015-11-11 ENCOUNTER — Ambulatory Visit
Admission: RE | Admit: 2015-11-11 | Discharge: 2015-11-11 | Disposition: A | Discharge status: Home / Self Care | Payer: Commercial Managed Care - HMO | Source: Ambulatory Visit | Attending: Radiation Oncology | Admitting: Radiation Oncology

## 2015-11-11 DIAGNOSIS — Z510 Encounter for antineoplastic radiation therapy: Secondary | ICD-10-CM | POA: Diagnosis not present

## 2015-11-12 ENCOUNTER — Ambulatory Visit
Admission: RE | Admit: 2015-11-12 | Discharge: 2015-11-12 | Disposition: A | Discharge status: Home / Self Care | Payer: Commercial Managed Care - HMO | Source: Ambulatory Visit | Attending: Radiation Oncology | Admitting: Radiation Oncology

## 2015-11-12 ENCOUNTER — Other Ambulatory Visit: Payer: Self-pay

## 2015-11-12 ENCOUNTER — Telehealth: Payer: Self-pay

## 2015-11-12 DIAGNOSIS — Z510 Encounter for antineoplastic radiation therapy: Secondary | ICD-10-CM | POA: Diagnosis not present

## 2015-11-12 MED ORDER — TRAMADOL HCL 50 MG PO TABS: 50.0000 mg | ORAL_TABLET | Freq: Four times a day (QID) | ORAL | 0 refills | Status: DC | PRN

## 2015-11-12 NOTE — Telephone Encounter (Signed)
d 

## 2015-11-12 NOTE — Telephone Encounter (Signed)
Called med in per jcl 

## 2015-11-12 NOTE — Telephone Encounter (Signed)
Okay to renew

## 2015-11-12 NOTE — Telephone Encounter (Signed)
Pt states that she needs a refill of tramadol called to [facility]. She is a vickie pt, please address while she is on vacation. Thanks, Wells Guiles

## 2015-11-13 ENCOUNTER — Ambulatory Visit
Admission: RE | Admit: 2015-11-13 | Discharge: 2015-11-13 | Disposition: A | Discharge status: Home / Self Care | Payer: Commercial Managed Care - HMO | Source: Ambulatory Visit | Attending: Radiation Oncology | Admitting: Radiation Oncology

## 2015-11-13 ENCOUNTER — Encounter: Payer: Self-pay | Admitting: Radiation Oncology

## 2015-11-13 VITALS — BP 105/65 | HR 101 | Temp 98.7°F | Ht 68.0 in | Wt 244.3 lb

## 2015-11-13 DIAGNOSIS — Z510 Encounter for antineoplastic radiation therapy: Secondary | ICD-10-CM | POA: Diagnosis not present

## 2015-11-13 DIAGNOSIS — C50212 Malignant neoplasm of upper-inner quadrant of left female breast: Secondary | ICD-10-CM

## 2015-11-13 MED ORDER — SONAFINE EX EMUL
1.0000 "application " | Freq: Once | CUTANEOUS | Status: AC
  Administered 2015-11-13: 1 via TOPICAL

## 2015-11-13 NOTE — Progress Notes (Signed)
Department of Radiation Oncology  Phone:  [phone] Fax:        [phone]  Weekly Treatment Note    Name: Brandi Stephens Date: 11/13/2015 MRN: [id number] DOB: [birth year]   Diagnosis:     ICD-9-CM ICD-10-CM   1. Breast cancer of upper-inner quadrant of left female breast (HCC) 174.2 C50.212 SONAFINE emulsion 1 application     Current dose: 42.5 Gy  Current fraction: 17   MEDICATIONS: Current Outpatient Prescriptions  Medication Sig Dispense Refill  . allopurinol (ZYLOPRIM) 100 MG tablet TAKE 2 TABLETS EVERY DAY 180 tablet 1  . aluminum chloride (DRYSOL) 20 % external solution Apply topically at bedtime. 35 mL 0  . atorvastatin (LIPITOR) 20 MG tablet Take 1 tablet (20 mg total) by mouth daily. 90 tablet 1  . BIOTIN 5000 PO Take 5,000 capsules by mouth daily.     . fluticasone (FLOVENT HFA) 110 MCG/ACT inhaler Inhale 2 puffs into the lungs 2 (two) times daily. 3 Inhaler 3  . furosemide (LASIX) 20 MG tablet Take 20 mg by mouth at bedtime. Reported on 10/28/2015    . KLOR-CON 10 10 MEQ tablet Take 10 mEq by mouth as needed. Reported on 10/28/2015    . levothyroxine (SYNTHROID, LEVOTHROID) 75 MCG tablet Take 1 tablet (75 mcg total) by mouth daily. 90 tablet 1  . MAGNESIUM CITRATE PO Take 2 tablets by mouth at bedtime. Reported on 09/29/2015    . Melatonin-Pyridoxine (MELATONIN-B6 PO) Take 1 tablet by mouth at bedtime.    . primidone (MYSOLINE) 50 MG tablet Take 0.5 tablets (25 mg total) by mouth at bedtime. 45 tablet 1  . rOPINIRole (REQUIP) 0.25 MG tablet Take 1 tablet (0.25 mg total) by mouth at bedtime. 90 tablet 1  . tiotropium (SPIRIVA) 18 MCG inhalation capsule Place 1 capsule (18 mcg total) into inhaler and inhale daily. 90 capsule 3  . traMADol (ULTRAM) 50 MG tablet Take 1 tablet (50 mg total) by mouth every 6 (six) hours as needed. 90 tablet 0  . valsartan-hydrochlorothiazide (DIOVAN-HCT) 320-12.5 MG tablet Take 1 tablet by mouth daily. 90 tablet 1  . pyridOXINE  (VITAMIN B-6) 100 MG tablet Take 100 mg by mouth daily.     No current facility-administered medications for this encounter.      ALLERGIES: Ampicillin; Codeine; Morphine and related; and Penicillins   LABORATORY DATA:  Lab Results  Component Value Date   WBC 13.8 (H) 08/26/2015   HGB 14.6 08/26/2015   HCT 44.5 08/26/2015   MCV 93.1 08/26/2015   PLT 313 08/26/2015   Lab Results  Component Value Date   NA 141 08/26/2015   K 5.1 08/26/2015   CL 109 08/26/2015   CO2 23 08/26/2015   Lab Results  Component Value Date   ALT 13 07/22/2015   AST 13 07/22/2015   ALKPHOS 83 07/22/2015   BILITOT 0.30 07/22/2015     NARRATIVE: Brandi Stephens was seen today for weekly treatment management. The chart was checked and the patient's films were reviewed.  Brandi Stephens has completed 17 fractions to her left breast.  She reports that her left nipple area is very sensitive.  She also reports having itching of her left upper chest.  She is using radiaplex and has been given sonafine to try.  She reports having fatigue.  The skin on her left breast is red.  She also has a large firm area in her left breast.  She reports she has a seroma and thinks it  is bigger.  6:23 PM BP 105/65 (BP Location: Right Arm, Patient Position: Sitting)   Pulse (!) 101   Temp 98.7 F (37.1 C) (Oral)   Ht 5\' 8"  (1.727 m)   Wt 244 lb 4.8 oz (110.8 kg)   SpO2 98%   BMI 37.15 kg/m   PHYSICAL EXAMINATION: height is 5\' 8"  (1.727 m) and weight is 244 lb 4.8 oz (110.8 kg). Her oral temperature is 98.7 F (37.1 C). Her blood pressure is 105/65 and her pulse is 101 (abnormal). Her oxygen saturation is 98%.      The patient has some increased size/swelling in the seroma cavity region. No overlying significant erythema beyond what would be expected from her course of radiation treatment. No infection seen currently therefore.  ASSESSMENT: The patient is doing satisfactorily with treatment. The patient is doing well  but has had an increase in the size of the seroma.  PLAN: We will continue with the patient's radiation treatment as planned. The patient will undergo an additional simulation on Monday to check her current planned versus this potential change in anatomy. We will see whether replanning the patient's current treatment and her boost treatment will be necessary.   ------------------------------------------------   Tyler Pita, MD [place] Director and Director of Stereotactic Radiosurgery Direct Dial: [phone]  Fax: [phone] .[phone]  Skype  LinkedIn     This document serves as a record of services personally performed by Tyler Pita, MD. It was created on his behalf by Truddie Hidden, a trained medical scribe. The creation of this record is based on the scribe's personal observations and the provider's statements to them. This document has been checked and approved by the attending provider.

## 2015-11-13 NOTE — Progress Notes (Signed)
Brandi Stephens has completed 17 fractions to her left breast.  She reports that her left nipple area is very sensitive.  She also reports having itching of her left upper chest.  She is using radiaplex and has been given sonafine to try.  She reports having fatigue.  The skin on her left breast is red.  She also has a large firm area in her left breast.  She reports she has a seroma and thinks it is bigger.  BP 105/65 (BP Location: Right Arm, Patient Position: Sitting)   Pulse (!) 101   Temp 98.7 F (37.1 C) (Oral)   Ht 5\' 8"  (1.727 m)   Wt 244 lb 4.8 oz (110.8 kg)   SpO2 98%   BMI 37.15 kg/m    Wt Readings from Last 3 Encounters:  11/13/15 244 lb 4.8 oz (110.8 kg)  11/06/15 247 lb 1.6 oz (112.1 kg)  10/30/15 245 lb 9.6 oz (111.4 kg)

## 2015-11-14 ENCOUNTER — Other Ambulatory Visit: Payer: Self-pay | Admitting: Family Medicine

## 2015-11-16 ENCOUNTER — Ambulatory Visit
Admission: RE | Admit: 2015-11-16 | Discharge: 2015-11-16 | Disposition: A | Discharge status: Home / Self Care | Payer: Commercial Managed Care - HMO | Source: Ambulatory Visit | Attending: Radiation Oncology | Admitting: Radiation Oncology

## 2015-11-16 DIAGNOSIS — Z510 Encounter for antineoplastic radiation therapy: Secondary | ICD-10-CM | POA: Diagnosis not present

## 2015-11-17 ENCOUNTER — Ambulatory Visit
Admission: RE | Admit: 2015-11-17 | Discharge: 2015-11-17 | Disposition: A | Discharge status: Home / Self Care | Payer: Commercial Managed Care - HMO | Source: Ambulatory Visit | Attending: Radiation Oncology | Admitting: Radiation Oncology

## 2015-11-17 DIAGNOSIS — Z510 Encounter for antineoplastic radiation therapy: Secondary | ICD-10-CM | POA: Diagnosis not present

## 2015-11-18 ENCOUNTER — Telehealth: Payer: Self-pay | Admitting: *Deleted

## 2015-11-18 ENCOUNTER — Encounter: Payer: Self-pay | Admitting: Radiation Oncology

## 2015-11-18 ENCOUNTER — Ambulatory Visit
Admission: RE | Admit: 2015-11-18 | Discharge: 2015-11-18 | Disposition: A | Discharge status: Home / Self Care | Payer: Commercial Managed Care - HMO | Source: Ambulatory Visit | Attending: Radiation Oncology | Admitting: Radiation Oncology

## 2015-11-18 DIAGNOSIS — Z510 Encounter for antineoplastic radiation therapy: Secondary | ICD-10-CM | POA: Diagnosis not present

## 2015-11-18 DIAGNOSIS — C50212 Malignant neoplasm of upper-inner quadrant of left female breast: Secondary | ICD-10-CM

## 2015-11-18 NOTE — Telephone Encounter (Signed)
  Oncology Nurse Navigator Documentation    Navigator Encounter Type: Telephone (11/18/15 1600) Telephone: Outgoing Call (11/18/15 1600)         Patient Visit Type: [id number] (11/18/15 1600) Treatment Phase: Final Radiation Tx (11/18/15 1600) Barriers/Navigation Needs: No barriers at this time (11/18/15 1600)   Interventions: Referrals (11/18/15 1600) Referrals: Survivorship (11/18/15 1600)                    Time Spent with Patient: 15 (11/18/15 1600)

## 2015-11-19 ENCOUNTER — Telehealth: Payer: Self-pay | Admitting: Oncology

## 2015-11-19 NOTE — Telephone Encounter (Signed)
per pof to sch pt appt-cld & spoke to pt and gave pt time & date of appt for 11/9@10 

## 2015-11-25 ENCOUNTER — Encounter: Payer: Self-pay | Admitting: Family Medicine

## 2015-11-25 ENCOUNTER — Ambulatory Visit (INDEPENDENT_AMBULATORY_CARE_PROVIDER_SITE_OTHER): Payer: Commercial Managed Care - HMO | Admitting: Family Medicine

## 2015-11-25 VITALS — BP 126/84 | HR 64 | Temp 97.9°F | Wt 244.8 lb

## 2015-11-25 DIAGNOSIS — N189 Chronic kidney disease, unspecified: Secondary | ICD-10-CM | POA: Diagnosis not present

## 2015-11-25 DIAGNOSIS — N3001 Acute cystitis with hematuria: Secondary | ICD-10-CM | POA: Diagnosis not present

## 2015-11-25 DIAGNOSIS — I10 Essential (primary) hypertension: Secondary | ICD-10-CM | POA: Diagnosis not present

## 2015-11-25 DIAGNOSIS — G2581 Restless legs syndrome: Secondary | ICD-10-CM | POA: Diagnosis not present

## 2015-11-25 DIAGNOSIS — R350 Frequency of micturition: Secondary | ICD-10-CM | POA: Diagnosis not present

## 2015-11-25 DIAGNOSIS — R7303 Prediabetes: Secondary | ICD-10-CM | POA: Diagnosis not present

## 2015-11-25 LAB — POCT URINALYSIS DIPSTICK
Bilirubin, UA: NEGATIVE
GLUCOSE UA: NEGATIVE
Ketones, UA: NEGATIVE
NITRITE UA: NEGATIVE
PROTEIN UA: NEGATIVE
Spec Grav, UA: >=1.03
UROBILINOGEN UA: NEGATIVE
pH, UA: 6

## 2015-11-25 LAB — CBC WITH DIFFERENTIAL/PLATELET
BASOS PCT: 1 %
Basophils Absolute: 119 cells/uL (ref 0–200)
EOS PCT: 4 %
Eosinophils Absolute: 476 cells/uL (ref 15–500)
HEMATOCRIT: 45.2 % — AB (ref 35.0–45.0)
Hemoglobin: 14.9 g/dL (ref 11.7–15.5)
LYMPHS PCT: 16 %
Lymphs Abs: 1904 cells/uL (ref 850–3900)
MCH: 31.2 pg (ref 27.0–33.0)
MCHC: 33 g/dL (ref 32.0–36.0)
MCV: 94.6 fL (ref 80.0–100.0)
MONO ABS: 833 {cells}/uL (ref 200–950)
MONOS PCT: 7 %
MPV: 9.9 fL (ref 7.5–12.5)
Neutro Abs: 8568 cells/uL — ABNORMAL HIGH (ref 1500–7800)
Neutrophils Relative %: 72 %
PLATELETS: 251 10*3/uL (ref 140–400)
RBC: 4.78 MIL/uL (ref 3.80–5.10)
RDW: 14.6 % (ref 11.0–15.0)
WBC: 11.9 10*3/uL — AB (ref 4.0–10.5)

## 2015-11-25 MED ORDER — ROPINIROLE HCL 0.25 MG PO TABS: 0.2500 mg | ORAL_TABLET | Freq: Every day | ORAL | 1 refills | Status: DC

## 2015-11-25 MED ORDER — SULFAMETHOXAZOLE-TRIMETHOPRIM 800-160 MG PO TABS: 1.0000 | ORAL_TABLET | Freq: Two times a day (BID) | ORAL | 0 refills | Status: DC

## 2015-11-25 MED ORDER — PRIMIDONE 50 MG PO TABS: 25.0000 mg | ORAL_TABLET | Freq: Every day | ORAL | 1 refills | Status: DC

## 2015-11-25 NOTE — Progress Notes (Signed)
   Subjective:    Patient ID: Brandi Stephens, female    DOB: [birth year], 70 y.o.   MRN: [id number]  HPI Chief Complaint  Patient presents with  . Urinary Tract Infection    frequency, odor, pressure, fasting labs   She is here with complaints of urinary frequency, malodorous, cloudy urine and pelvic pressure for past 2 days.  A;lso reports right sided flank pain that started today but this may be related to lifting her granddaughter. Denies history of recurrent UTI or Pyelonephritis. Last UTI was years ago.  Her blood pressures have been in normal range and she is taking her medication daily. She would like to have her kidney function tested and states she is concerned that her kidneys may be worsening. She is taking lasix prn for le edema. No issues today with peripheral edema.  She is requesting a refill on her Requip and states this is helped her restless legs.  Denies fever, chills, chest pain, DOE, nausea, vomiting or diarrhea, vaginal discharge.   Review of Systems Pertinent positives and negatives in the history of present illness.     Objective:   Physical Exam  Constitutional: She appears well-developed and well-nourished. No distress.  Cardiovascular: Normal rate and regular rhythm.   Pulmonary/Chest: Effort normal and breath sounds normal.  Abdominal: Soft. Bowel sounds are normal. She exhibits no distension. There is no tenderness. There is no rebound and no guarding.  Genitourinary:  Genitourinary Comments: Deferred genitourinary exam  Skin: Skin is warm and dry. No rash noted. No pallor.   BP 126/84   Pulse 64   Temp 97.9 F (36.6 C) (Oral)   Wt 244 lb 12.8 oz (111 kg)   BMI 37.22 kg/m   Urinalysis dipstick: spec grav 1.030, leuk 1+, blood 1+ otherwise negative     Assessment & Plan:  Urinary frequency - Plan: POCT urinalysis dipstick, Urine culture  RLS (restless legs syndrome) - Plan: rOPINIRole (REQUIP) 0.25 MG tablet  Essential hypertension - Plan:  Basic metabolic panel  Prediabetes  Chronic kidney disease, unspecified stage - Plan: Basic metabolic panel  Acute cystitis with hematuria - Plan: CBC with Differential/Platelet, POCT urinalysis dipstick, Urine culture, sulfamethoxazole-trimethoprim (BACTRIM DS,SEPTRA DS) 800-160 MG tablet  She appears to have uncomplicated acute cystitis.  Discussed UTI prevention and red flags such as fever, chills, worsening back pain and recommend staying well hydrated. Will treat with a 5 day course of Bactrim and culture urine.  Will follow up pending urine culture and pending labs.  requip refilled, doing well on this.  Recommend that she return for a medicare wellness visit and fasting labs. She will need Hgb A1c and lipids. Also due for colonoscopy. Will repeat urine to make sure hematuria clears up.

## 2015-11-26 LAB — URINE CULTURE: Organism ID, Bacteria: 10000

## 2015-11-26 LAB — BASIC METABOLIC PANEL
BUN: 20 mg/dL (ref 7–25)
CHLORIDE: 109 mmol/L (ref 98–110)
CO2: 22 mmol/L (ref 20–31)
Calcium: 9.7 mg/dL (ref 8.6–10.4)
Creat: 0.94 mg/dL (ref 0.50–0.99)
Glucose, Bld: 105 mg/dL — ABNORMAL HIGH (ref 65–99)
POTASSIUM: 4.8 mmol/L (ref 3.5–5.3)
SODIUM: 139 mmol/L (ref 135–146)

## 2015-12-13 NOTE — Progress Notes (Signed)
Brandi Stephens  [phone] [phone]     ID: [name] DOB: [birth year]  MR#: [id number]  WPY#:[id number]  Patient Care Team: Girtha Rm, NP as PCP - General (Family Medicine) Chauncey Cruel, MD as Consulting Physician (Oncology) Stark Klein, MD as Consulting Physician (General Surgery) Kyung Rudd, MD as Consulting Physician (Radiation Oncology) [name], MD as Consulting Physician (Family Medicine) [name], MD as Consulting Physician (Neurosurgery) Griselda Miner, MD as Consulting Physician (Dermatology) PCP: Harland Dingwall, NP GYN: OTHER MD:  CHIEF COMPLAINT: Estrogen receptor positive breast cancer  CURRENT TREATMENT: Anastrozole   BREAST CANCER HISTORY: From the original intake note:  Brandi Stephens started to feel some right breast pain and soreness in March 2017. She brought it to Dr. Lanice Shirts nurse practitioner, Loletha Carrow and since attention, and she was set up for bilateral diagnostic mammography with tomography at the [facility] 07/02/2015 area did the breast density was category C. The patient is status post bilateral reduction mammoplasties. Mammography showed no suspicious masses or calcifications in the right breast, and ultrasonography of the area where she has discomfort shows an area of distortion in the right breast at the 8:00 position 12 cm from the nipple measuring 1.4 cm thought to represent scarring related to the prior reduction surgery, but warranting further evaluation.  Also, in the left breast there was an area of distortion in the upper inner quadrant noted mammographically. Targeted ultrasound evaluating the left breast found no sonographic correlate to the mammographic findings. Ultrasound evaluation of both axillae were benign.  On 07/13/2015 Brandi Stephens underwent biopsy of the right breast area in question which showed only fibrocystic changes with adenosis. Biopsy of the left breast upper inner  quadrant however showed invasive ductal carcinoma, grade 1, estrogen receptor 90% positive, progesterone receptor 70% positive, WITH strong staining intensity, with an MIB-1 of 3%, and no HER-2 amplification, the signals ratio being 0.97 and the number per cell 1.65.  The patient's subsequent history is as detailed below  INTERVAL HISTORY: Brandi Stephens returns today for follow-up of her early stage stage breast cancer. Since her last visit here she completed her radiation treatments (last dose 11/18/2015.) She generally did well with the treatments, although she still somewhat fatigued. She still has a "sunburn, and itching over the radiation port area. She is using radiaplex for that. She is now ready to discuss anti-estrogens  REVIEW OF SYSTEMS: She continues to have severe back pain, which is fairly disabling. She had extensive surgery late 2016. She sleeps poorly. Her left foot and legs have been swelling. She is constipated. She has a seroma in her surgical breast that she would like taking care of. Is very hard for her to walk with the back problem. This limits her ability to exercise. She is having moderate hot flashes. A detailed review of systems today was otherwise stable  PAST MEDICAL HISTORY: Past Medical History:  Diagnosis Date  . Anxiety   . Arthritis   . Breast cancer ([place])    left breast   . Breast cancer of lower-outer quadrant of right female breast ([place]) 07/14/2015  . COPD (chronic obstructive pulmonary disease) ([place])   . Hemorrhoids   . Hypertension   . Hypothyroidism   . PONV (postoperative nausea and vomiting)   . Prediabetes     PAST SURGICAL HISTORY: Past Surgical History:  Procedure Laterality Date  . ABDOMINAL HYSTERECTOMY  1985  . BASAL CELL CARCINOMA EXCISION  10/15   nose  . BREAST LUMPECTOMY  WITH RADIOACTIVE SEED AND SENTINEL LYMPH NODE BIOPSY Left 09/16/2015   Procedure: BREAST LUMPECTOMY WITH RADIOACTIVE SEED AND SENTINEL LYMPH NODE BIOPSY;  Surgeon: Stark Klein, MD;  Location: [facility];  Service: General;  Laterality: Left;  . BREAST REDUCTION SURGERY  1998  . [place]   hallo  . COLECTOMY  2005   right side  . EYE SURGERY Left 07/2014   cataract removed 3 months later  . HAND SURGERY Left   . [place]  2008  . KNEE ARTHROSCOPY Right 2002  . TONSILLECTOMY    . TUBAL LIGATION      FAMILY HISTORY Family History  Problem Relation Age of Onset  . Breast cancer Maternal Aunt   . Lung cancer Maternal Uncle   . Stomach cancer Maternal Grandmother   . Lung cancer Maternal Aunt   . Brain cancer Maternal Aunt   The patient's father died at the age of 69 and her mother at the age of 56. The patient had no brothers, 2 sisters. The patient's mother was diagnosed with uterine cancer at the age of 47. The patient's sister was diagnosed with labial cancer at the age of 64. There is a maternal aunt with a history of breast cancer but the patient does not know at what age she was diagnosed. There is also on the mother's side history of lung cancer brain cancer and stomach cancer.   GYNECOLOGIC HISTORY:  No LMP recorded. Patient has had a hysterectomy.  Menarche age 13 first live birth age 46, the patient is [place] P3. She underwent hysterectomy without salpingo-oophorectomy at age 48. She took hormone replacement only for a few months.   SOCIAL HISTORY:   the patient is a retired Architect. Her husband on is also retired. He used to work for [employer]. Daughter Brandi Stephens lives in [place] and she is a Wellsite geologist, currently not employed. Daughter Brandi Stephens lives in [place] and has a Brewing technologist in social work but is not currently working. Son Brandi Stephens died at age 33 from causes not clear after autopsy. He had had significant neurologic damage following an accident     ADVANCED DIRECTIVES:  not in place    HEALTH MAINTENANCE: Social History  Substance Use Topics  . Smoking status: Former  Smoker    Packs/day: 1.00    Years: 35.00    Types: Cigarettes    Quit date: 03/15/2010  . Smokeless tobacco: Not on file  . Alcohol use 0.6 oz/week    1 Glasses of wine per week     Colonoscopy: 2015/[facility]   PAP:  Bone density: 2016  Lipid panel:  Allergies  Allergen Reactions  . Ampicillin Hives    Severe reaction in February 2017 1.5 month to have hives to go away  . Codeine Nausea And Vomiting  . Morphine And Related Itching  . Penicillins Other (See Comments)    Has patient had a PCN reaction causing immediate rash, facial/tongue/throat swelling, SOB or lightheadedness with hypotension: No Has patient had a PCN reaction causing severe rash involving mucus membranes or skin necrosis: No Has patient had a PCN reaction that required hospitalization No Has patient had a PCN reaction occurring within the last 10 years: No If all of the above answers are "NO", then may proceed with Cephalosporin use.     Current Outpatient Prescriptions  Medication Sig Dispense Refill  . allopurinol (ZYLOPRIM) 100 MG tablet TAKE 2 TABLETS EVERY DAY 180 tablet 1  .  aluminum chloride (DRYSOL) 20 % external solution Apply topically at bedtime. 35 mL 0  . atorvastatin (LIPITOR) 20 MG tablet Take 1 tablet (20 mg total) by mouth daily. 90 tablet 1  . BIOTIN 5000 PO Take 5,000 capsules by mouth daily.     . fluticasone (FLOVENT HFA) 110 MCG/ACT inhaler Inhale 2 puffs into the lungs 2 (two) times daily. 3 Inhaler 3  . furosemide (LASIX) 20 MG tablet Take 20 mg by mouth at bedtime. Reported on 10/28/2015    . KLOR-CON 10 10 MEQ tablet Take 10 mEq by mouth as needed. Reported on 10/28/2015    . levothyroxine (SYNTHROID, LEVOTHROID) 75 MCG tablet Take 1 tablet (75 mcg total) by mouth daily. 90 tablet 1  . MAGNESIUM CITRATE PO Take 2 tablets by mouth at bedtime. Reported on 09/29/2015    . Melatonin-Pyridoxine (MELATONIN-B6 PO) Take 1 tablet by mouth at bedtime.    . primidone (MYSOLINE) 50 MG tablet Take  0.5 tablets (25 mg total) by mouth at bedtime. 45 tablet 1  . pyridOXINE (VITAMIN B-6) 100 MG tablet Take 100 mg by mouth daily.    Marland Kitchen rOPINIRole (REQUIP) 0.25 MG tablet Take 1 tablet (0.25 mg total) by mouth at bedtime. 90 tablet 1  . tiotropium (SPIRIVA) 18 MCG inhalation capsule Place 1 capsule (18 mcg total) into inhaler and inhale daily. 90 capsule 3  . traMADol (ULTRAM) 50 MG tablet Take 0.5 tablets (25 mg total) by mouth every 6 (six) hours as needed. 90 tablet 0  . valsartan-hydrochlorothiazide (DIOVAN-HCT) 320-12.5 MG tablet Take 1 tablet by mouth daily. 90 tablet 1   No current facility-administered medications for this visit.     OBJECTIVE: Middle-aged white woman Who appears stated age 43:   12/14/15 1341  BP: 128/76  Pulse: (!) 109  Resp: 19  Temp: 98.7 F (37.1 C)     Body mass index is 37.02 kg/m.    ECOG FS:2 - Symptomatic, <50% confined to bed  Sclerae unicteric, EOMs intact Oropharynx clear and moist No cervical or supraclavicular adenopathy Lungs no rales or rhonchi Heart regular rate and rhythm Abd soft, obese, nontender, positive bowel sounds MSK no focal spinal tenderness, left lower extremity lymphedema without erythema Neuro: nonfocal, well oriented, appropriate affect Breasts: The right breast is unremarkable. The left breast is status post lumpectomy and radiation. There is still some hyperpigmentation. There is a 4-5 cm seroma in the superior central aspect of the breast, with no associated erythema. There is some tenderness. There is no evidence of local recurrence. The left axilla is benign.  LAB RESULTS:  CMP     Component Value Date/Time   NA 139 11/25/2015 1604   NA 140 07/22/2015 1227   K 4.8 11/25/2015 1604   K 5.1 07/22/2015 1227   CL 109 11/25/2015 1604   CO2 22 11/25/2015 1604   CO2 26 07/22/2015 1227   GLUCOSE 105 (H) 11/25/2015 1604   GLUCOSE 100 07/22/2015 1227   BUN 20 11/25/2015 1604   BUN 25.0 07/22/2015 1227   CREATININE  0.94 11/25/2015 1604   CREATININE 1.3 (H) 07/22/2015 1227   CALCIUM 9.7 11/25/2015 1604   CALCIUM 9.8 07/22/2015 1227   PROT 7.1 07/22/2015 1227   ALBUMIN 3.6 07/22/2015 1227   AST 13 07/22/2015 1227   ALT 13 07/22/2015 1227   ALKPHOS 83 07/22/2015 1227   BILITOT 0.30 07/22/2015 1227   GFRNONAA >60 05/20/2015 0820   GFRAA >60 05/20/2015 0820    INo results found  for: SPEP, UPEP  Lab Results  Component Value Date   WBC 11.9 (H) 11/25/2015   NEUTROABS 8,568 (H) 11/25/2015   HGB 14.9 11/25/2015   HCT 45.2 (H) 11/25/2015   MCV 94.6 11/25/2015   PLT 251 11/25/2015      Chemistry      Component Value Date/Time   NA 139 11/25/2015 1604   NA 140 07/22/2015 1227   K 4.8 11/25/2015 1604   K 5.1 07/22/2015 1227   CL 109 11/25/2015 1604   CO2 22 11/25/2015 1604   CO2 26 07/22/2015 1227   BUN 20 11/25/2015 1604   BUN 25.0 07/22/2015 1227   CREATININE 0.94 11/25/2015 1604   CREATININE 1.3 (H) 07/22/2015 1227      Component Value Date/Time   CALCIUM 9.7 11/25/2015 1604   CALCIUM 9.8 07/22/2015 1227   ALKPHOS 83 07/22/2015 1227   AST 13 07/22/2015 1227   ALT 13 07/22/2015 1227   BILITOT 0.30 07/22/2015 1227       No results found for: LABCA2  No components found for: LABCA125  No results for input(s): INR in the last 168 hours.  Urinalysis    Component Value Date/Time   COLORURINE YELLOW 05/15/2015 [place] 05/15/2015 1218   LABSPEC 1.012 05/15/2015 1218   PHURINE 5.0 05/15/2015 1218   GLUCOSEU NEGATIVE 05/15/2015 1218   HGBUR NEGATIVE 05/15/2015 1218   BILIRUBINUR n 11/25/2015 [place] 05/15/2015 1218   PROTEINUR n 11/25/2015 1712   PROTEINUR NEGATIVE 05/15/2015 1218   UROBILINOGEN negative 11/25/2015 1712   NITRITE n 11/25/2015 1712   NITRITE NEGATIVE 05/15/2015 1218   LEUKOCYTESUR small (1+) (A) 11/25/2015 1712      ELIGIBLE FOR AVAILABLE RESEARCH PROTOCOL: no  STUDIES: No results found.  ASSESSMENT: 70 y.o.  [place] woman  Status post left breast upper inner quadrant biopsy 07/13/2015 for a clinical [place] N0 invasive ductal carcinoma, grade 1, estrogen and progesterone receptor positive, HER-2 nonamplified, with an MIB-1 of 3%.  (a) biopsy of a suspicious lesion in the right breast proved to be a small fibroadenoma  (1) left lumpectomy and sentinel lymph node sampling 09/16/2015 showed a pT1a pN0 invasive ductal carcinoma, grade 1, with negative margins, stage IA    (2) adjuvant radiation completed 11/18/2015  (3) To start anastrozole 02/16/2010.  PLAN: Christyn is finally ready to discuss anti-estrogens.  '@PATIENTFIRSTNAME' @   has completed her local treatment and is now ready to start anti-estrogens. We reiewed the difference between tamoxifen and anastrozole in detail. She understands that anastrozole and the aromatase inhibitors in general work by blocking estrogen production. Accordingly vaginal dryness, decrease in bone density, and of course hot flashes can result. The aromatase inhibitors can also negatively affect the cholesterol profile, although that is a minor effect. One out of 5 women on aromatase inhibitors we will feel "old and achy". This arthralgia/myalgia syndrome, which resembles fibromyalgia clinically, does resolve with stopping the medications. Accordingly this is not a reason to not try an aromatase inhibitor but it is a frequent reason to stop it (in other words 20% of women will not be able to tolerate these medications).  Tamoxifen on the other hand does not block estrogen production. It does not "take away a woman's estrogen". It blocks the estrogen receptor in breast cells. Like anastrozole, it can also cause hot flashes. As opposed to anastrozole, tamoxifen has many estrogen-like effects. It is technically an estrogen receptor modulator. This means that in some tissues tamoxifen  works like estrogen-- for example it helps strengthen the bones. It tends to improve the cholesterol  profile. It can cause thickening of the endometrial lining, and even endometrial polyps or rarely cancer of the uterus.(The risk of uterine cancer due to tamoxifen is one additional cancer per thousand women year). It can cause vaginal wetness or stickiness. It can cause blood clots through this estrogen-like effect--the risk of blood clots with tamoxifen is exactly the same as with birth control pills or hormone replacement.  Neither of these agents causes mood changes or weight gain, despite the popular belief that they can have these side effects. We have data from studies comparing either of these drugs with placebo, and in those cases the control group had the same amount of weight gain and depression as the group that took the drug.  After much discussion we decided anastrozole would be better for her, however she is not psychologically ready to proceed with this. There are 2 many other issues that have not yet settled down. In particular she wants this aromatase be more diminished and the back pain to be under better control.  Accordingly we are not starting anastrozole until 02/17/2016. She will see me again early next year to discuss tolerance. If she tolerates it well and will start seeing her on a once a year basis  Incidentally I am concerned about the left lower extremity lymphedema noted above. I'm setting her for a Doppler ultrasound today.    Chauncey Cruel, MD   12/14/2015 1:46 PM Medical Oncology and Hematology [facility] [address] [place], [place] [address] Tel. [phone]    Fax. [phone]   ADDENDUM: Doppler ultrasonography of the left lower extremity today showed no evidence of clot.

## 2015-12-14 ENCOUNTER — Ambulatory Visit (HOSPITAL_BASED_OUTPATIENT_CLINIC_OR_DEPARTMENT_OTHER): Payer: Commercial Managed Care - HMO | Admitting: Oncology

## 2015-12-14 ENCOUNTER — Telehealth: Payer: Self-pay | Admitting: Oncology

## 2015-12-14 ENCOUNTER — Ambulatory Visit (HOSPITAL_COMMUNITY)
Admission: RE | Admit: 2015-12-14 | Discharge: 2015-12-14 | Disposition: A | Discharge status: Home / Self Care | Payer: Commercial Managed Care - HMO | Source: Ambulatory Visit | Attending: Oncology | Admitting: Oncology

## 2015-12-14 ENCOUNTER — Other Ambulatory Visit: Payer: Self-pay | Admitting: *Deleted

## 2015-12-14 VITALS — BP 128/76 | HR 109 | Temp 98.7°F | Resp 19 | Ht 68.0 in | Wt 243.5 lb

## 2015-12-14 DIAGNOSIS — M7989 Other specified soft tissue disorders: Secondary | ICD-10-CM | POA: Insufficient documentation

## 2015-12-14 DIAGNOSIS — C50212 Malignant neoplasm of upper-inner quadrant of left female breast: Secondary | ICD-10-CM

## 2015-12-14 DIAGNOSIS — I890 Lymphedema, not elsewhere classified: Secondary | ICD-10-CM

## 2015-12-14 DIAGNOSIS — M79605 Pain in left leg: Secondary | ICD-10-CM | POA: Diagnosis not present

## 2015-12-14 DIAGNOSIS — N6310 Unspecified lump in the right breast, unspecified quadrant: Secondary | ICD-10-CM

## 2015-12-14 DIAGNOSIS — N63 Unspecified lump in breast: Secondary | ICD-10-CM

## 2015-12-14 DIAGNOSIS — K5900 Constipation, unspecified: Secondary | ICD-10-CM

## 2015-12-14 MED ORDER — ANASTROZOLE 1 MG PO TABS: 1.0000 mg | ORAL_TABLET | Freq: Every day | ORAL | 4 refills | Status: DC

## 2015-12-14 MED ORDER — TRAMADOL HCL 50 MG PO TABS
25.0000 mg | ORAL_TABLET | Freq: Four times a day (QID) | ORAL | 0 refills | Status: DC | PRN
Start: 2015-12-14 — End: 2016-01-20

## 2015-12-14 NOTE — Progress Notes (Signed)
Preliminary results by tech - Left Lower Ext. Venous Duplex Completed. Negative for deep and superficial vein thrombosis in the left leg. Results given to [facility].  Oda Cogan, BS, RDMS, RVT

## 2015-12-14 NOTE — Telephone Encounter (Signed)
appt appt made and avs printed

## 2015-12-23 NOTE — Progress Notes (Signed)
  Radiation Oncology         [phone] ________________________________  Name: Brandi Stephens MRN: [id number]  Date: 11/18/2015  DOB: 1945/12/16  End of Treatment Note  Diagnosis:   Left-sided breast cancer     Indication for treatment:  Curative       Radiation treatment dates:   10/22/2015 through 11/18/2015  Site/dose:   The patient initially received a dose of 42.5 Gy in 17 fractions to the breast using whole-breast tangent fields. This was delivered using a 3-D conformal technique. The patient then received a boost to the seroma. This delivered an additional 7.5 Gy in 3 fractions using a 3 field photon technique due to the depth of the seroma. The total dose was 50 Gy.  Narrative: The patient tolerated radiation treatment relatively well.   The patient had some expected skin irritation as she progressed during treatment. Moist desquamation was not present at the end of treatment.  Plan: The patient has completed radiation treatment. The patient will return to radiation oncology clinic for routine followup in one month. I advised the patient to call or return sooner if they have any questions or concerns related to their recovery or treatment. ________________________________  Jodelle Gross, M.D., Ph.D.

## 2015-12-25 ENCOUNTER — Telehealth: Payer: Self-pay | Admitting: Family Medicine

## 2015-12-25 NOTE — Telephone Encounter (Signed)
Pt called and stated that she needs a referral to Dr. Layne Benton at [facility]. This is her regular ortho and she needs an appt next week. Pt can be reached at [phone].

## 2015-12-25 NOTE — Telephone Encounter (Signed)
Pt was notified and I have done referral into acuity for pt to call and make appointment now.   Auth # [id number] Approved # 12/25/15-06/22/16   Knee pain- M25.569

## 2015-12-25 NOTE — Telephone Encounter (Signed)
Please take care of this for her 

## 2015-12-31 ENCOUNTER — Ambulatory Visit
Admission: RE | Admit: 2015-12-31 | Payer: Commercial Managed Care - HMO | Source: Ambulatory Visit | Admitting: Radiation Oncology

## 2016-01-06 ENCOUNTER — Ambulatory Visit
Admission: RE | Admit: 2016-01-06 | Discharge: 2016-01-06 | Disposition: A | Discharge status: Home / Self Care | Payer: Commercial Managed Care - HMO | Source: Ambulatory Visit | Attending: Radiation Oncology | Admitting: Radiation Oncology

## 2016-01-06 ENCOUNTER — Encounter: Payer: Self-pay | Admitting: Radiation Oncology

## 2016-01-06 VITALS — BP 125/83 | HR 118 | Temp 98.4°F | Ht 68.0 in | Wt 243.0 lb

## 2016-01-06 DIAGNOSIS — Z885 Allergy status to narcotic agent status: Secondary | ICD-10-CM | POA: Insufficient documentation

## 2016-01-06 DIAGNOSIS — Z170 Estrogen receptor positive status [ER+]: Secondary | ICD-10-CM | POA: Insufficient documentation

## 2016-01-06 DIAGNOSIS — Z880 Allergy status to penicillin: Secondary | ICD-10-CM | POA: Diagnosis not present

## 2016-01-06 DIAGNOSIS — C50212 Malignant neoplasm of upper-inner quadrant of left female breast: Secondary | ICD-10-CM

## 2016-01-06 DIAGNOSIS — Z923 Personal history of irradiation: Secondary | ICD-10-CM | POA: Diagnosis not present

## 2016-01-06 DIAGNOSIS — R002 Palpitations: Secondary | ICD-10-CM | POA: Insufficient documentation

## 2016-01-06 DIAGNOSIS — Z7951 Long term (current) use of inhaled steroids: Secondary | ICD-10-CM | POA: Diagnosis not present

## 2016-01-06 DIAGNOSIS — Z79899 Other long term (current) drug therapy: Secondary | ICD-10-CM | POA: Diagnosis not present

## 2016-01-06 DIAGNOSIS — R600 Localized edema: Secondary | ICD-10-CM | POA: Insufficient documentation

## 2016-01-06 DIAGNOSIS — N6489 Other specified disorders of breast: Secondary | ICD-10-CM | POA: Insufficient documentation

## 2016-01-06 DIAGNOSIS — C50912 Malignant neoplasm of unspecified site of left female breast: Secondary | ICD-10-CM | POA: Diagnosis present

## 2016-01-06 NOTE — Progress Notes (Addendum)
Radiation Oncology         [phone] ________________________________  Name: Brandi Stephens MRN: [id number]  Date: 01/06/2016  DOB: 03-27-1946  Post Treatment Note  CC: Harland Dingwall, NP  Magrinat, Virgie Dad, MD  Diagnosis:   Stage Ia, ER/PR positive pT1a, pN0 invasive ductal carcinoma of the left breast  Interval Since Last Radiation:  7 weeks   10/22/2015 through 11/18/2015: The patient initially received a dose of 42.5 Gy in 17 fractions to the breast using whole-breast tangent fields. This was delivered using a 3-D conformal technique. The patient then received a boost to the seroma. This delivered an additional 7.5 Gy in 3 fractions using a 3 field photon technique due to the depth of the seroma. The total dose was 50 Gy.  Narrative:  The patient returns today for routine follow-up.She did will in tolerating her radiotherapy without significant skin changes. She did have some increase in size of her surgical site consistent with seroma, but did not require aspiration, and was able to complete radiotherapy without difficulty. She has started her Anastrazole with Dr. Jana Hakim. She also has been having trouble with lower extremity edema and did have a doppler ultrasound of the left leg  on 12/14/15 which was negative for clot.                           On review of systems, the patient states she is feeling quite well. She denies any concerns with her skin. She does report that she continues to have bilateral LE edema, but that this is L>R. She also has noticed several episodes of a rapid heart rate. This comes and goes without any particular activities but was noted here. Her pulse went up to 118, then dropped back less than 100 when this was re-checked. Although she is now asymptomatic, she has infrequent episodes of sharp sternal chest pain. She denies any shortness of breath, but has noticed some fatigue. Her symptoms have been present since her episode of sepsis from pneumonia at the  beginning of 2017. She reports that her pulse rates haven't been able to be captured previously when getting vitals checked. No other complaints are noted.  ALLERGIES:  is allergic to ampicillin; codeine; morphine and related; and penicillins.  Meds: Current Outpatient Prescriptions  Medication Sig Dispense Refill  . allopurinol (ZYLOPRIM) 100 MG tablet TAKE 2 TABLETS EVERY DAY 180 tablet 1  . aluminum chloride (DRYSOL) 20 % external solution Apply topically at bedtime. 35 mL 0  . atorvastatin (LIPITOR) 20 MG tablet Take 1 tablet (20 mg total) by mouth daily. 90 tablet 1  . BIOTIN 5000 PO Take 5,000 capsules by mouth daily.     . fluticasone (FLOVENT HFA) 110 MCG/ACT inhaler Inhale 2 puffs into the lungs 2 (two) times daily. 3 Inhaler 3  . furosemide (LASIX) 20 MG tablet Take 20 mg by mouth at bedtime. Reported on 10/28/2015    . KLOR-CON 10 10 MEQ tablet Take 10 mEq by mouth as needed. Reported on 10/28/2015    . levothyroxine (SYNTHROID, LEVOTHROID) 75 MCG tablet Take 1 tablet (75 mcg total) by mouth daily. 90 tablet 1  . MAGNESIUM CITRATE PO Take 2 tablets by mouth at bedtime. Reported on 09/29/2015    . Melatonin-Pyridoxine (MELATONIN-B6 PO) Take 1 tablet by mouth at bedtime.    . primidone (MYSOLINE) 50 MG tablet Take 0.5 tablets (25 mg total) by mouth at bedtime. 45 tablet 1  .  pyridOXINE (VITAMIN B-6) 100 MG tablet Take 100 mg by mouth daily.    Marland Kitchen rOPINIRole (REQUIP) 0.25 MG tablet Take 1 tablet (0.25 mg total) by mouth at bedtime. 90 tablet 1  . tiotropium (SPIRIVA) 18 MCG inhalation capsule Place 1 capsule (18 mcg total) into inhaler and inhale daily. 90 capsule 3  . traMADol (ULTRAM) 50 MG tablet Take 0.5 tablets (25 mg total) by mouth every 6 (six) hours as needed. 90 tablet 0  . valsartan-hydrochlorothiazide (DIOVAN-HCT) 320-12.5 MG tablet Take 1 tablet by mouth daily. 90 tablet 1  . anastrozole (ARIMIDEX) 1 MG tablet Take 1 tablet (1 mg total) by mouth daily. (Patient not taking:  Reported on 01/06/2016) 90 tablet 4   No current facility-administered medications for this encounter.     Physical Findings:  height is 5\' 8"  (1.727 m) and weight is 243 lb (110.2 kg). Her oral temperature is 98.4 F (36.9 C). Her blood pressure is 125/83 and her pulse is 118 (abnormal).  In general this is a well appearing Caucasian female in no acute distress. She's alert and oriented x4 and appropriate throughout the examination. Cardiopulmonary assessment is negative for acute distress and she exhibits normal effort. Her left breast is assessed and without desquamation. There is a palpable fullness stable from during treatment that is consistent with a seroma that is about 6 cm in dimension. There is hyperpigmentation consistent with her previous treatment. Bilateral pitting edema is noted and is 1+ bilaterally from the dorsal aspect of her feet up to her patellar regions bilaterally.  Lab Findings: Lab Results  Component Value Date   WBC 11.9 (H) 11/25/2015   HGB 14.9 11/25/2015   HCT 45.2 (H) 11/25/2015   MCV 94.6 11/25/2015   PLT 251 11/25/2015     Radiographic Findings: No results found.  Impression/Plan: 1. Stage Ia, ER/PR positive pT1a, pN0 invasive ductal carcinoma of the left breast. The patient is recovering well from radiotherapy. We discussed that we would be happy to see her back if she has questions or concerns regarding her radiotherapy. Otherwise, she will continue to follow up with Dr. Jana Hakim for her estrogen blockade. 2. Survivorship. The patient is counseled on the role for meeting with survivorship clinic. A referral has been made for the patient to be seen in November with Mike Craze, NP. 3. Palpitations and lower extremity edema. The causes of these symptoms have yet to be determined. I will refer her to cardiology for formal evaluation. 4. Seroma. The patient will follow up with Dr. Barry Dienes for evaluation to determine if she needs aspiration of  this.    Carola Rhine, PAC

## 2016-01-06 NOTE — Addendum Note (Signed)
Encounter addended by: Benn Moulder, RN on: 01/06/2016  2:57 PM<BR>    Actions taken: Charge Capture section accepted

## 2016-01-06 NOTE — Progress Notes (Signed)
Brandi Stephens here for reassessment S/P XRT to her  Left breast. Note mild hyperpigmentation.  Continues to have a seroma  in the anterior breast above mostly above the nipple region.  Denies pain.  She feels that she is recovering without any difficulty since EOT.  Note irregular heart beat upoin palpation of pulse.

## 2016-01-07 ENCOUNTER — Telehealth: Payer: Self-pay | Admitting: *Deleted

## 2016-01-07 NOTE — Telephone Encounter (Signed)
CALLED PATIENT TO INFORM OF APPT. WITH DR. HILTY ON 01-08-16- ARRIVAL TIME - 11:15 AM @ [place]. [address],  PH. NO. [phone], AND ALSO HER SURVIVORSHIP APPT., LVM FOR A RETURN CALL

## 2016-01-08 ENCOUNTER — Ambulatory Visit (INDEPENDENT_AMBULATORY_CARE_PROVIDER_SITE_OTHER): Payer: Commercial Managed Care - HMO | Admitting: Internal Medicine

## 2016-01-08 ENCOUNTER — Encounter: Payer: Self-pay | Admitting: Internal Medicine

## 2016-01-08 VITALS — BP 118/70 | HR 106 | Ht 68.0 in | Wt 241.6 lb

## 2016-01-08 DIAGNOSIS — R000 Tachycardia, unspecified: Secondary | ICD-10-CM | POA: Diagnosis not present

## 2016-01-08 DIAGNOSIS — R0602 Shortness of breath: Secondary | ICD-10-CM | POA: Diagnosis not present

## 2016-01-08 DIAGNOSIS — R5383 Other fatigue: Secondary | ICD-10-CM | POA: Diagnosis not present

## 2016-01-08 DIAGNOSIS — R600 Localized edema: Secondary | ICD-10-CM

## 2016-01-08 DIAGNOSIS — R002 Palpitations: Secondary | ICD-10-CM

## 2016-01-08 DIAGNOSIS — I10 Essential (primary) hypertension: Secondary | ICD-10-CM

## 2016-01-08 DIAGNOSIS — Z87891 Personal history of nicotine dependence: Secondary | ICD-10-CM

## 2016-01-08 NOTE — Patient Instructions (Addendum)
Medication Instructions:  Your physician recommends that you continue on your current medications as directed. Please refer to the Current Medication list given to you today.  Testing/Procedures: Your physician has requested that you have a lexiscan myoview. For further information please visit [email or web address]. Please follow instruction sheet, as given.  Your physician has requested that you have an echocardiogram. Echocardiography is a painless test that uses sound waves to create images of your heart. It provides your doctor with information about the size and shape of your heart and how well your heart's chambers and valves are working. This procedure takes approximately one hour. There are no restrictions for this procedure.  Your physician has recommended that you wear a 48 holter monitor. Holter monitors are medical devices that record the heart's electrical activity. Doctors most often use these monitors to diagnose arrhythmias. Arrhythmias are problems with the speed or rhythm of the heartbeat. The monitor is a small, portable device. You can wear one while you do your normal daily activities. This is usually used to diagnose what is causing palpitations/syncope (passing out).   Follow-Up: Your physician recommends that you schedule a follow-up appointment in: After all testing   NEXT APPT  DATE:______________________________________  TIME:______________________________________AM/PM    If you need a refill on your cardiac medications before your next appointment, please call your pharmacy.

## 2016-01-11 DIAGNOSIS — R600 Localized edema: Secondary | ICD-10-CM | POA: Insufficient documentation

## 2016-01-11 DIAGNOSIS — R000 Tachycardia, unspecified: Secondary | ICD-10-CM | POA: Insufficient documentation

## 2016-01-11 DIAGNOSIS — R5383 Other fatigue: Secondary | ICD-10-CM | POA: Insufficient documentation

## 2016-01-11 DIAGNOSIS — R002 Palpitations: Secondary | ICD-10-CM | POA: Insufficient documentation

## 2016-01-11 DIAGNOSIS — Z87891 Personal history of nicotine dependence: Secondary | ICD-10-CM | POA: Insufficient documentation

## 2016-01-11 DIAGNOSIS — I4711 Inappropriate sinus tachycardia, so stated: Secondary | ICD-10-CM | POA: Insufficient documentation

## 2016-01-11 DIAGNOSIS — R0602 Shortness of breath: Secondary | ICD-10-CM | POA: Insufficient documentation

## 2016-01-11 NOTE — Progress Notes (Signed)
OFFICE NOTE  Chief Complaint:  Palpitations, chest discomfort  Primary Care Physician: Brandi Dingwall, NP  HPI:  Brandi Stephens is a 70 y.o. female who is undergoing radiation treatment for breast cancer. She has been feeling palpitations and chest discomfort recently and was referred for evaluation. She has cardiac risk factors, including 35 pack year smoking history, age, hypertension and prediabetes. She reports her episodes of palpitations and associated chest discomfort last for a few minutes and resolve. Not necessarily at exertion or relieved by rest. Recent CT angio showed aortic atherlosclerosis. She also has unexplained tachycardia at rest.  PMHx:  Past Medical History:  Diagnosis Date  . Anxiety   . Arthritis   . Breast cancer ([place])    left breast   . Breast cancer of lower-outer quadrant of right female breast ([place]) 07/14/2015  . COPD (chronic obstructive pulmonary disease) ([place])   . Hemorrhoids   . Hypertension   . Hypothyroidism   . PONV (postoperative nausea and vomiting)   . Prediabetes     Past Surgical History:  Procedure Laterality Date  . ABDOMINAL HYSTERECTOMY  1985  . BASAL CELL CARCINOMA EXCISION  10/15   nose  . BREAST LUMPECTOMY WITH RADIOACTIVE SEED AND SENTINEL LYMPH NODE BIOPSY Left 09/16/2015   Procedure: BREAST LUMPECTOMY WITH RADIOACTIVE SEED AND SENTINEL LYMPH NODE BIOPSY;  Surgeon: Stark Klein, MD;  Location: [facility];  Service: General;  Laterality: Left;  . BREAST REDUCTION SURGERY  1998  . [place]   hallo  . COLECTOMY  2005   right side  . EYE SURGERY Left 07/2014   cataract removed 3 months later  . HAND SURGERY Left   . [place]  2008  . KNEE ARTHROSCOPY Right 2002  . TONSILLECTOMY    . TUBAL LIGATION      FAMHx:  Family History  Problem Relation Age of Onset  . Breast cancer Maternal Aunt   . Lung cancer Maternal Uncle   . Stomach cancer Maternal Grandmother   . Lung  cancer Maternal Aunt   . Brain cancer Maternal Aunt     SOCHx:   reports that she quit smoking about 5 years ago. Her smoking use included Cigarettes. She has a 35.00 pack-year smoking history. She does not have any smokeless tobacco history on file. She reports that she drinks about 0.6 oz of alcohol per week . She reports that she does not use drugs.  ALLERGIES:  Allergies  Allergen Reactions  . Ampicillin Hives    Severe reaction in February 2017 1.5 month to have hives to go away  . Codeine Nausea And Vomiting  . Morphine And Related Itching  . Penicillins Other (See Comments)    Has patient had a PCN reaction causing immediate rash, facial/tongue/throat swelling, SOB or lightheadedness with hypotension: No Has patient had a PCN reaction causing severe rash involving mucus membranes or skin necrosis: No Has patient had a PCN reaction that required hospitalization No Has patient had a PCN reaction occurring within the last 10 years: No If all of the above answers are "NO", then may proceed with Cephalosporin use.     ROS: Pertinent items noted in HPI and remainder of comprehensive ROS otherwise negative.  HOME MEDS: Current Outpatient Prescriptions on File Prior to Visit  Medication Sig Dispense Refill  . allopurinol (ZYLOPRIM) 100 MG tablet TAKE 2 TABLETS EVERY DAY 180 tablet 1  . aluminum chloride (DRYSOL) 20 % external solution Apply  topically at bedtime. 35 mL 0  . atorvastatin (LIPITOR) 20 MG tablet Take 1 tablet (20 mg total) by mouth daily. 90 tablet 1  . BIOTIN 5000 PO Take 5,000 capsules by mouth daily.     . fluticasone (FLOVENT HFA) 110 MCG/ACT inhaler Inhale 2 puffs into the lungs 2 (two) times daily. 3 Inhaler 3  . furosemide (LASIX) 20 MG tablet Take 20 mg by mouth at bedtime. Reported on 10/28/2015    . KLOR-CON 10 10 MEQ tablet Take 10 mEq by mouth as needed. Reported on 10/28/2015    . levothyroxine (SYNTHROID, LEVOTHROID) 75 MCG tablet Take 1 tablet (75 mcg  total) by mouth daily. 90 tablet 1  . MAGNESIUM CITRATE PO Take 2 tablets by mouth at bedtime. Reported on 09/29/2015    . Melatonin-Pyridoxine (MELATONIN-B6 PO) Take 1 tablet by mouth at bedtime.    . primidone (MYSOLINE) 50 MG tablet Take 0.5 tablets (25 mg total) by mouth at bedtime. 45 tablet 1  . pyridOXINE (VITAMIN B-6) 100 MG tablet Take 100 mg by mouth daily.    Marland Kitchen rOPINIRole (REQUIP) 0.25 MG tablet Take 1 tablet (0.25 mg total) by mouth at bedtime. 90 tablet 1  . tiotropium (SPIRIVA) 18 MCG inhalation capsule Place 1 capsule (18 mcg total) into inhaler and inhale daily. 90 capsule 3  . traMADol (ULTRAM) 50 MG tablet Take 0.5 tablets (25 mg total) by mouth every 6 (six) hours as needed. 90 tablet 0  . valsartan-hydrochlorothiazide (DIOVAN-HCT) 320-12.5 MG tablet Take 1 tablet by mouth daily. 90 tablet 1  . anastrozole (ARIMIDEX) 1 MG tablet Take 1 tablet (1 mg total) by mouth daily. (Patient not taking: Reported on 01/08/2016) 90 tablet 4   No current facility-administered medications on file prior to visit.     LABS/IMAGING: No results found for this or any previous visit (from the past 48 hour(s)). No results found.  WEIGHTS: Wt Readings from Last 3 Encounters:  01/08/16 241 lb 9.6 oz (109.6 kg)  01/06/16 243 lb (110.2 kg)  12/14/15 243 lb 8 oz (110.5 kg)    VITALS: BP 118/70   Pulse (!) 106   Ht 5\' 8"  (1.727 m)   Wt 241 lb 9.6 oz (109.6 kg)   BMI 36.74 kg/m   EXAM: General appearance: alert and no distress Neck: no carotid bruit and no JVD Lungs: clear to auscultation bilaterally Heart: regular rate and rhythm Abdomen: soft, non-tender; bowel sounds normal; no masses,  no organomegaly Extremities: edema trace pedal edema Pulses: 2+ and symmetric Skin: Skin color, texture, turgor normal. No rashes or lesions Neurologic: Grossly normal Psych: Pleasant  EKG: Sinus tachycardia at 106  ASSESSMENT: 1. Palpitations / tachycardia 2. Chest discomfort 3. Breast  CA 4. Hypertension  PLAN: 1.   [name] has unexplained tachycardia and palpitations with associated chest discomfort. Does have ASCVD. Will obtain a lexiscan myoview to r/o ischemic cause. Check 48 hour monitor for arrhythmias. BP at goal. Follow-up with me to review tests afterwards.  Thanks for the kind referral.  [name], MD, [facility] Attending Cardiologist [place] 01/11/2016, 9:57 PM

## 2016-01-13 ENCOUNTER — Telehealth: Payer: Self-pay | Admitting: Internal Medicine

## 2016-01-13 NOTE — Telephone Encounter (Signed)
Pt had an voicemail on my machine for 2 referrals.  Dr. Valetta Close at [facility] appt for Tomorrow 01/14/16  (Z96.1) and Dr. Kathyrn Sheriff for ortho (M51.26)  I have done both through [facility]. Pt is aware that it was getting done

## 2016-01-18 ENCOUNTER — Ambulatory Visit (INDEPENDENT_AMBULATORY_CARE_PROVIDER_SITE_OTHER): Payer: Commercial Managed Care - HMO

## 2016-01-18 DIAGNOSIS — R002 Palpitations: Secondary | ICD-10-CM | POA: Diagnosis not present

## 2016-01-18 DIAGNOSIS — R000 Tachycardia, unspecified: Secondary | ICD-10-CM | POA: Diagnosis not present

## 2016-01-19 ENCOUNTER — Telehealth (HOSPITAL_COMMUNITY): Payer: Self-pay

## 2016-01-19 NOTE — Telephone Encounter (Signed)
Encounter complete. 

## 2016-01-20 ENCOUNTER — Telehealth: Payer: Self-pay | Admitting: Internal Medicine

## 2016-01-20 DIAGNOSIS — J449 Chronic obstructive pulmonary disease, unspecified: Secondary | ICD-10-CM

## 2016-01-20 DIAGNOSIS — G2581 Restless legs syndrome: Secondary | ICD-10-CM

## 2016-01-20 MED ORDER — VALSARTAN-HYDROCHLOROTHIAZIDE 320-12.5 MG PO TABS: 1.0000 | ORAL_TABLET | Freq: Every day | ORAL | 1 refills | Status: DC

## 2016-01-20 MED ORDER — ATORVASTATIN CALCIUM 20 MG PO TABS: 20.0000 mg | ORAL_TABLET | Freq: Every day | ORAL | 1 refills | Status: DC

## 2016-01-20 MED ORDER — PRIMIDONE 50 MG PO TABS: 25.0000 mg | ORAL_TABLET | Freq: Every day | ORAL | 1 refills | Status: DC

## 2016-01-20 MED ORDER — VALSARTAN-HYDROCHLOROTHIAZIDE 320-12.5 MG PO TABS: 1.0000 | ORAL_TABLET | Freq: Every day | ORAL | 0 refills | Status: DC

## 2016-01-20 MED ORDER — LEVOTHYROXINE SODIUM 75 MCG PO TABS: 75.0000 ug | ORAL_TABLET | Freq: Every day | ORAL | 1 refills | Status: DC

## 2016-01-20 MED ORDER — TRAMADOL HCL 50 MG PO TABS: 50.0000 mg | ORAL_TABLET | Freq: Four times a day (QID) | ORAL | 0 refills | Status: DC | PRN

## 2016-01-20 MED ORDER — ROPINIROLE HCL 0.25 MG PO TABS: 0.2500 mg | ORAL_TABLET | Freq: Every day | ORAL | 1 refills | Status: DC

## 2016-01-20 MED ORDER — FLUTICASONE PROPIONATE HFA 110 MCG/ACT IN AERO: 2.0000 | INHALATION_SPRAY | Freq: Two times a day (BID) | RESPIRATORY_TRACT | 1 refills | Status: DC

## 2016-01-20 MED ORDER — LEVOTHYROXINE SODIUM 75 MCG PO TABS: 75.0000 ug | ORAL_TABLET | Freq: Every day | ORAL | 0 refills | Status: DC

## 2016-01-20 MED ORDER — ALLOPURINOL 100 MG PO TABS: 200.0000 mg | ORAL_TABLET | Freq: Every day | ORAL | 1 refills | Status: DC

## 2016-01-20 NOTE — Telephone Encounter (Signed)
Pt called asking asking for a 30 days of tramadol, thryoid and bp med to local pharmacy and then the rest of her meds go to [place] as a 90 day. I have sent tramadol, throid and bp med to pharmacy (called in pain med) and then sent all other med and called in tramadol to [facility]

## 2016-01-21 ENCOUNTER — Ambulatory Visit (HOSPITAL_COMMUNITY)
Admission: RE | Admit: 2016-01-21 | Discharge: 2016-01-21 | Disposition: A | Discharge status: Home / Self Care | Payer: Commercial Managed Care - HMO | Source: Ambulatory Visit | Attending: Internal Medicine | Admitting: Internal Medicine

## 2016-01-21 DIAGNOSIS — R000 Tachycardia, unspecified: Secondary | ICD-10-CM

## 2016-01-21 DIAGNOSIS — Z87891 Personal history of nicotine dependence: Secondary | ICD-10-CM

## 2016-01-21 DIAGNOSIS — I10 Essential (primary) hypertension: Secondary | ICD-10-CM | POA: Insufficient documentation

## 2016-01-21 DIAGNOSIS — R0602 Shortness of breath: Secondary | ICD-10-CM | POA: Diagnosis not present

## 2016-01-21 DIAGNOSIS — R600 Localized edema: Secondary | ICD-10-CM | POA: Diagnosis not present

## 2016-01-21 DIAGNOSIS — R5383 Other fatigue: Secondary | ICD-10-CM | POA: Insufficient documentation

## 2016-01-21 MED ORDER — REGADENOSON 0.4 MG/5ML IV SOLN
0.4000 mg | Freq: Once | INTRAVENOUS | Status: AC
  Administered 2016-01-21: 0.4 mg via INTRAVENOUS

## 2016-01-21 MED ORDER — TECHNETIUM TC 99M TETROFOSMIN IV KIT
28.5000 | PACK | Freq: Once | INTRAVENOUS | Status: AC | PRN
  Administered 2016-01-21: 28.5 via INTRAVENOUS
  Filled 2016-01-21: qty 29

## 2016-01-22 ENCOUNTER — Ambulatory Visit (HOSPITAL_BASED_OUTPATIENT_CLINIC_OR_DEPARTMENT_OTHER): Payer: Commercial Managed Care - HMO

## 2016-01-22 ENCOUNTER — Ambulatory Visit (HOSPITAL_COMMUNITY)
Admission: RE | Admit: 2016-01-22 | Discharge: 2016-01-22 | Disposition: A | Discharge status: Home / Self Care | Payer: Commercial Managed Care - HMO | Source: Ambulatory Visit | Attending: Cardiology | Admitting: Cardiology

## 2016-01-22 ENCOUNTER — Other Ambulatory Visit: Payer: Self-pay

## 2016-01-22 DIAGNOSIS — R0602 Shortness of breath: Secondary | ICD-10-CM

## 2016-01-22 DIAGNOSIS — R5383 Other fatigue: Secondary | ICD-10-CM | POA: Diagnosis not present

## 2016-01-22 DIAGNOSIS — R600 Localized edema: Secondary | ICD-10-CM | POA: Insufficient documentation

## 2016-01-22 DIAGNOSIS — I5030 Unspecified diastolic (congestive) heart failure: Secondary | ICD-10-CM | POA: Diagnosis not present

## 2016-01-22 LAB — MYOCARDIAL PERFUSION IMAGING
CHL CUP NUCLEAR SDS: 5
CHL CUP NUCLEAR SRS: 0
CHL CUP NUCLEAR SSS: 5
CSEPPHR: 112 {beats}/min
LV dias vol: 72 mL (ref 46–106)
LVSYSVOL: 26 mL
Rest HR: 107 {beats}/min
TID: 0.87

## 2016-01-22 MED ORDER — TECHNETIUM TC 99M TETROFOSMIN IV KIT
29.2000 | PACK | Freq: Once | INTRAVENOUS | Status: AC | PRN
  Administered 2016-01-22: 29.2 via INTRAVENOUS

## 2016-01-25 ENCOUNTER — Ambulatory Visit: Payer: Self-pay | Admitting: Family Medicine

## 2016-01-25 ENCOUNTER — Ambulatory Visit
Admission: RE | Admit: 2016-01-25 | Discharge: 2016-01-25 | Disposition: A | Discharge status: Home / Self Care | Payer: Commercial Managed Care - HMO | Source: Ambulatory Visit | Attending: Family Medicine | Admitting: Family Medicine

## 2016-01-25 ENCOUNTER — Encounter: Payer: Self-pay | Admitting: Family Medicine

## 2016-01-25 ENCOUNTER — Ambulatory Visit (INDEPENDENT_AMBULATORY_CARE_PROVIDER_SITE_OTHER): Payer: Commercial Managed Care - HMO | Admitting: Family Medicine

## 2016-01-25 VITALS — BP 120/70 | HR 105 | Temp 97.6°F | Wt 242.0 lb

## 2016-01-25 DIAGNOSIS — M25512 Pain in left shoulder: Secondary | ICD-10-CM

## 2016-01-25 NOTE — Progress Notes (Signed)
   Subjective:    Patient ID: Brandi Stephens, female    DOB: [birth year], 70 y.o.   MRN: [id number]  HPI Chief Complaint  Patient presents with  . shoulder pain    shoulder pain- radiating pain   She is here with complaints of left shoulder pain for several months but worse for the past week since having her arm in an awkward position last week during her a chemical stress test at her cardiologist appointment. States she is having cardiac testing for irregular rhythm.   Pain is mostly anterior, radiates downward to her mid bicep. Pain is worse with movement and relieved somewhat with rest.  Has taken 2 aleve twice daily for the past week and used a lidocaine patch. Has minimal relief. She is taking tramadol for chronic back pain and this is not helping much with shoulder pain.  Denies fever, chills, neck pain, chest pain, palpitations, DOE, abdominal pain, N/V/D.   Chronic back pain- saw her surgeon and is going for another MRI in a couple of weeks.   Reviewed allergies, medications, past medical,  and social history.    Review of Systems Pertinent positives and negatives in the history of present illness.     Objective:   Physical Exam  Constitutional: She is oriented to person, place, and time. She appears well-developed and well-nourished. No distress.  Cardiovascular: Normal rate, regular rhythm, normal heart sounds and intact distal pulses.  Exam reveals no gallop and no friction rub.   No murmur heard. Pulmonary/Chest: Effort normal and breath sounds normal.  Musculoskeletal:       Left shoulder: She exhibits decreased range of motion, tenderness and pain. She exhibits normal pulse.  RUE is neurovascularly intact. No obvious deformity, erythema, warmth, edema. Unable to perform complete exam due to limited ROM and pain. Severe tenderness to anterior left AC and bicipital tendon groove.   Neurological: She is alert and oriented to person, place, and time.  Skin: Skin is warm  and dry. No rash noted. No erythema. No pallor.  Psychiatric: She has a normal mood and affect. Her behavior is normal. Thought content normal.   BP 120/70   Pulse (!) 105   Temp 97.6 F (36.4 C) (Oral)   Wt 242 lb (109.8 kg)   BMI 36.80 kg/m      Assessment & Plan:  Left anterior shoulder pain - Plan: DG Shoulder Left  Discussed that I am not able to perform a good shoulder exam due to severe pain and limited ROM due to pain.  Plan to send her for a shoulder XR.  Will follow up pending XR result.  XR was not resulted at 8:50 pm, plan to have patient double up on tramadol, take aleve, use heat and lidocaine patch for pain relief until I get her XR result.  Discussed trying a shoulder sling.  Offered for her to go to after hours orthopedic clinic at [facility]. She declined.  She is aware that she may need to be seen tomorrow for further evaluation and pain medication. Consider referral back to orthopedist. She has seen ortho at [facility] in the past.

## 2016-01-25 NOTE — Patient Instructions (Signed)
[facility] [place], [place] [address]  [phone]   Mon-Fri 5:30pm-9pm

## 2016-01-26 ENCOUNTER — Telehealth: Payer: Self-pay | Admitting: Internal Medicine

## 2016-01-26 NOTE — Telephone Encounter (Signed)
Pt seeing rebecca bassett tomorrow for shoulder pain., I have done a referral and been approved   Auth # [id number]

## 2016-01-27 ENCOUNTER — Ambulatory Visit: Payer: Self-pay | Admitting: Family Medicine

## 2016-02-01 DIAGNOSIS — I5189 Other ill-defined heart diseases: Secondary | ICD-10-CM

## 2016-02-01 DIAGNOSIS — I519 Heart disease, unspecified: Secondary | ICD-10-CM | POA: Diagnosis present

## 2016-02-01 HISTORY — DX: Other ill-defined heart diseases: I51.89

## 2016-02-01 HISTORY — DX: Heart disease, unspecified: I51.9

## 2016-02-03 ENCOUNTER — Other Ambulatory Visit: Payer: Self-pay | Admitting: Pharmacist

## 2016-02-03 NOTE — Patient Outreach (Signed)
Outreach call to [employer] regarding her request for follow up from the [facility] Medication Adherence Campaign. Called and spoke with patient. HIPAA identifiers verified and verbal consent received.  Patient reports that she has been taking her valsartan/hydrochlorothiazide daily as directed. Denies any missed doses or any barriers to taking her medications such as cost or side effects.   Patient reports that she has no medication questions or concerns at this time.  Harlow Asa, PharmD Clinical Pharmacist [facility] [phone]

## 2016-02-11 ENCOUNTER — Ambulatory Visit (INDEPENDENT_AMBULATORY_CARE_PROVIDER_SITE_OTHER): Payer: Commercial Managed Care - HMO | Admitting: Family Medicine

## 2016-02-11 VITALS — BP 120/76 | HR 108 | Temp 98.4°F | Wt 237.8 lb

## 2016-02-11 DIAGNOSIS — R238 Other skin changes: Secondary | ICD-10-CM | POA: Diagnosis not present

## 2016-02-11 NOTE — Progress Notes (Signed)
   Subjective:    Patient ID: SHANNETTA SEITZINGER, female    DOB: [birth year], 70 y.o.   MRN: [id number]  HPI Chief Complaint  Patient presents with  . possible tick    possible tick on top of head. headache for the last 5 days, tender, breakout alittle on face   She is here with complaints of a dark and tender area to top of her scalp as well as a couple of bumps to her right eyebrow. She wants to make sure that the dark area on her head is not a tick. States she has also had a mild headache. Reports being outside for long period last weekend but did not actually see a tick or insect.   Denies fever, chills, dizziness, vision changes, eye pain, neck stiffness, abdominal pain, nausea, vomiting, diarrhea. No rash to any other part of her body. No inflamed joints.   Review of Systems Pertinent positives and negatives in the history of present illness.     Objective:   Physical Exam BP 120/76   Pulse (!) 108   Temp 98.4 F (36.9 C) (Oral)   Wt 237 lb 12.8 oz (107.9 kg)   BMI 36.16 kg/m  Small round eroded area covered by a scab to scalp near hairline on forehead. Minimal erythema surrounding. No drainage, induration, fluctuance. No signs of infection. 2 small pinpoint raised areas to right medial eyebrow.       Assessment & Plan:  Skin irritation  Discussed that she does not have a tick on her scalp she does have a scab. I recommend using bacitracin and otherwise keeping the area clean. Discussed that she should keep an eye on the area and if she notices any signs of infection she will call. Symptoms may be related to possible insect bite as she was outside last weekend. She will follow up as needed.

## 2016-02-12 ENCOUNTER — Telehealth: Payer: Self-pay | Admitting: Internal Medicine

## 2016-02-12 MED ORDER — VALACYCLOVIR HCL 1 G PO TABS: 1000.0000 mg | ORAL_TABLET | Freq: Three times a day (TID) | ORAL | 0 refills | Status: DC

## 2016-02-12 NOTE — Telephone Encounter (Signed)
Pt called and her face has gotten worse with bumps and break out

## 2016-02-12 NOTE — Telephone Encounter (Signed)
I spoke with patient and since she now has bumps near her eye and the sensation of bumps beneath her eyelid. I recommend she call her ophthalmologist and be seen today. Plan to treat her as if this were shingles even though it did not look like the appearance of shingles yesterday when I saw her. Patient called and is going to see her ophthalmologist now. We'll send in a prescription for valacyclovir. Her chart states that she has an allergy to a cycle of ear however Gabriel Cirri spoke with patient and patient denies having issue with this medication in the past.

## 2016-02-15 ENCOUNTER — Other Ambulatory Visit: Payer: Self-pay | Admitting: Family Medicine

## 2016-02-15 ENCOUNTER — Telehealth: Payer: Self-pay | Admitting: Family Medicine

## 2016-02-15 MED ORDER — HYDROCODONE-ACETAMINOPHEN 5-325 MG PO TABS: 1.0000 | ORAL_TABLET | Freq: Four times a day (QID) | ORAL | 0 refills | Status: DC | PRN

## 2016-02-15 NOTE — Telephone Encounter (Signed)
Let her know that she can swing by or have someone pick up a prescription shortly. She can try cool compresses to the rash.

## 2016-02-15 NOTE — Telephone Encounter (Signed)
Pt said she has developed shingles rash around her right eye and across her forehead that is very painful. She has a constant headache that will not go away. She wants to know what she can do for the pain.

## 2016-02-15 NOTE — Telephone Encounter (Signed)
Pt states she doubled up on the tramadol this weekend and wants to try hydrocodone. She would like to know if there is anything topical she can place on the blisters. Brandi Stephens

## 2016-02-15 NOTE — Telephone Encounter (Signed)
Pt made aware of recommendations, and rx placed at front desk for pick up. Victorino December

## 2016-02-15 NOTE — Telephone Encounter (Signed)
Pt states that she does have shingles. Pt states it is all around her eye. Reports stabbing pain in her eye. Reports headache for 8 days. Pt has tried Tramadol with no relief. She states that she is having trouble sleeping. She said steroids have not worked much for her in the past. She goes to cancer treatments at [facility], and is supposed to start a therapy on Nov 1st. She feels this will be postponed. She also returns to see her eye doctor on Thursday- he said he would see her weekly until the shingles is cleared up. She was not able to attend her brother-in-law funeral.   [facility] [address] if you call in anything.

## 2016-02-15 NOTE — Telephone Encounter (Signed)
Please call and find out what her eye doctor told her on Friday. Has she tried doubling up on her tramadol yet? This is a possibility to see if it will help with her pain. Another option would be trying steroid for a few days. Has she had any issues with taking oral steroids in the past? Please find out where she is in her treatment regimen for breast cancer

## 2016-02-15 NOTE — Telephone Encounter (Signed)
I am ok with her trying to double up on the Tramadol and see if this helps or we can prescribe hydrocodone if she can take this.

## 2016-02-18 ENCOUNTER — Telehealth: Payer: Self-pay | Admitting: *Deleted

## 2016-02-18 NOTE — Telephone Encounter (Signed)
Received call from patient stating she has a bad case of the shingles all over her face and eyes and needs to reschedule her survivorship appt for a month out.  Msg sent to schedulers to change this and give patient a call with new appointment.

## 2016-02-25 ENCOUNTER — Encounter: Payer: Self-pay | Admitting: Adult Health

## 2016-03-04 ENCOUNTER — Other Ambulatory Visit: Payer: Self-pay | Admitting: Sports Medicine

## 2016-03-04 DIAGNOSIS — M25512 Pain in left shoulder: Secondary | ICD-10-CM

## 2016-03-07 ENCOUNTER — Telehealth: Payer: Self-pay | Admitting: *Deleted

## 2016-03-07 NOTE — Telephone Encounter (Signed)
Patient called and left a voice mail message stating,"Dr. Jana Hakim started me on Arimidex, and I had an allergic reaction this weekend. I called the on-call physician this weekend, and he told me to stop it immediately. I didn't go out of town like planned. I need to know what Dr. Jana Hakim wants me to do and is he going to switch me to another anticancer pill. Please call me at [phone]."

## 2016-03-12 ENCOUNTER — Other Ambulatory Visit: Payer: Self-pay | Admitting: Family Medicine

## 2016-03-13 ENCOUNTER — Other Ambulatory Visit: Payer: Self-pay | Admitting: Oncology

## 2016-03-13 NOTE — Progress Notes (Unsigned)
Called patient, who has what sounds like a severe flu, but indistinguishable she thinks from [facility] anastrozole symptom list.  We are holding off on anti estrogens until she sees me in [place]. 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 She is agreeqable to this plan

## 2016-03-16 ENCOUNTER — Ambulatory Visit: Payer: Commercial Managed Care - HMO | Admitting: Internal Medicine

## 2016-03-21 ENCOUNTER — Ambulatory Visit
Admission: RE | Admit: 2016-03-21 | Discharge: 2016-03-21 | Disposition: A | Discharge status: Home / Self Care | Payer: Commercial Managed Care - HMO | Source: Ambulatory Visit | Attending: Sports Medicine | Admitting: Sports Medicine

## 2016-03-21 DIAGNOSIS — M25512 Pain in left shoulder: Secondary | ICD-10-CM

## 2016-03-22 ENCOUNTER — Telehealth: Payer: Self-pay

## 2016-03-22 MED ORDER — ALLOPURINOL 100 MG PO TABS: 200.0000 mg | ORAL_TABLET | Freq: Every day | ORAL | 0 refills | Status: DC

## 2016-03-22 MED ORDER — ALLOPURINOL 100 MG PO TABS
200.0000 mg | ORAL_TABLET | Freq: Every day | ORAL | 0 refills | Status: DC
Start: 2016-03-22 — End: 2016-09-22

## 2016-03-22 NOTE — Telephone Encounter (Signed)
Pt states that she needs a refill of allopurinol. [facility] and [facility] for 90 days. Victorino December

## 2016-03-29 ENCOUNTER — Ambulatory Visit (INDEPENDENT_AMBULATORY_CARE_PROVIDER_SITE_OTHER): Payer: Commercial Managed Care - HMO | Admitting: Internal Medicine

## 2016-03-29 VITALS — BP 110/76 | HR 99 | Ht 68.0 in | Wt 246.0 lb

## 2016-03-29 DIAGNOSIS — I10 Essential (primary) hypertension: Secondary | ICD-10-CM

## 2016-03-29 DIAGNOSIS — R002 Palpitations: Secondary | ICD-10-CM | POA: Diagnosis not present

## 2016-03-29 DIAGNOSIS — Z01810 Encounter for preprocedural cardiovascular examination: Secondary | ICD-10-CM | POA: Insufficient documentation

## 2016-03-29 DIAGNOSIS — I493 Ventricular premature depolarization: Secondary | ICD-10-CM | POA: Diagnosis not present

## 2016-03-29 MED ORDER — METOPROLOL SUCCINATE ER 25 MG PO TB24: 12.5000 mg | ORAL_TABLET | Freq: Every day | ORAL | 3 refills | Status: DC

## 2016-03-29 NOTE — Progress Notes (Signed)
OFFICE NOTE  Chief Complaint:  Follow-up studies  Primary Care Physician: Harland Dingwall, NP  HPI:  Brandi Stephens is a 70 y.o. female who is undergoing radiation treatment for breast cancer. She has been feeling palpitations and chest discomfort recently and was referred for evaluation. She has cardiac risk factors, including 35 pack year smoking history, age, hypertension and prediabetes. She reports her episodes of palpitations and associated chest discomfort last for a few minutes and resolve. Not necessarily at exertion or relieved by rest. Recent CT angio showed aortic atherlosclerosis. She also has unexplained tachycardia at rest.  03/29/2016  Brandi Stephens returns today for follow-up. She underwent nuclear stress test which was negative for ischemia. She also had an echocardiogram which showed normal LV function and mild diastolic dysfunction. She wore a monitor which indicated sinus tachycardia and occasional PVCs. This consistent with her symptoms of intermittent palpitations.  PMHx:  Past Medical History:  Diagnosis Date  . Anxiety   . Arthritis   . Breast cancer ([place])    left breast   . Breast cancer of lower-outer quadrant of right female breast ([place]) 07/14/2015  . COPD (chronic obstructive pulmonary disease) ([place])   . Hemorrhoids   . Hypertension   . Hypothyroidism   . PONV (postoperative nausea and vomiting)   . Prediabetes     Past Surgical History:  Procedure Laterality Date  . ABDOMINAL HYSTERECTOMY  1985  . BASAL CELL CARCINOMA EXCISION  10/15   nose  . BREAST LUMPECTOMY WITH RADIOACTIVE SEED AND SENTINEL LYMPH NODE BIOPSY Left 09/16/2015   Procedure: BREAST LUMPECTOMY WITH RADIOACTIVE SEED AND SENTINEL LYMPH NODE BIOPSY;  Surgeon: Stark Klein, MD;  Location: [facility];  Service: General;  Laterality: Left;  . BREAST REDUCTION SURGERY  1998  . Scalp Level   hallo  . COLECTOMY  2005   right side  . EYE SURGERY Left  07/2014   cataract removed 3 months later  . HAND SURGERY Left   . [place]  2008  . KNEE ARTHROSCOPY Right 2002  . TONSILLECTOMY    . TUBAL LIGATION      FAMHx:  Family History  Problem Relation Age of Onset  . Atrial fibrillation Mother   . Breast cancer Maternal Aunt   . Lung cancer Maternal Uncle   . Stomach cancer Maternal Grandmother   . Lung cancer Maternal Aunt   . Brain cancer Maternal Aunt   . Prostate cancer Maternal Grandfather   . Stroke Paternal Grandmother   . Cancer Paternal Grandfather     SOCHx:   reports that she quit smoking about 6 years ago. Her smoking use included Cigarettes. She has a 35.00 pack-year smoking history. She does not have any smokeless tobacco history on file. She reports that she drinks about 0.6 oz of alcohol per week . She reports that she does not use drugs.  ALLERGIES:  Allergies  Allergen Reactions  . Ampicillin Hives    Severe reaction in February 2017 1.5 month to have hives to go away  . Codeine Nausea And Vomiting  . Morphine And Related Itching  . Penicillins Other (See Comments)    Has patient had a PCN reaction causing immediate rash, facial/tongue/throat swelling, SOB or lightheadedness with hypotension: No Has patient had a PCN reaction causing severe rash involving mucus membranes or skin necrosis: No Has patient had a PCN reaction that required hospitalization No Has patient had a PCN reaction occurring within the  last 10 years: No If all of the above answers are "NO", then may proceed with Cephalosporin use.     ROS: Pertinent items noted in HPI and remainder of comprehensive ROS otherwise negative.  HOME MEDS: Current Outpatient Prescriptions on File Prior to Visit  Medication Sig Dispense Refill  . allopurinol (ZYLOPRIM) 100 MG tablet Take 2 tablets (200 mg total) by mouth daily. 180 tablet 0  . aluminum chloride (DRYSOL) 20 % external solution Apply topically at bedtime. (Patient taking differently:  Apply topically at bedtime. ON HOLD) 35 mL 0  . anastrozole (ARIMIDEX) 1 MG tablet Take 1 tablet (1 mg total) by mouth daily. 90 tablet 4  . atorvastatin (LIPITOR) 20 MG tablet Take 1 tablet (20 mg total) by mouth daily. 90 tablet 1  . BIOTIN 5000 PO Take 5,000 capsules by mouth daily.     . fluticasone (FLOVENT HFA) 110 MCG/ACT inhaler Inhale 2 puffs into the lungs 2 (two) times daily. 3 Inhaler 1  . furosemide (LASIX) 20 MG tablet Take 20 mg by mouth daily as needed. Reported on 10/28/2015    . HYDROcodone-acetaminophen (NORCO) 5-325 MG tablet Take 1 tablet by mouth every 6 (six) hours as needed for moderate pain. 10 tablet 0  . KLOR-CON 10 10 MEQ tablet Take 10 mEq by mouth as needed. Reported on 10/28/2015    . levothyroxine (SYNTHROID, LEVOTHROID) 75 MCG tablet Take 1 tablet (75 mcg total) by mouth daily. 90 tablet 1  . MAGNESIUM CITRATE PO Take 2 tablets by mouth at bedtime. Reported on 09/29/2015    . Melatonin-Pyridoxine (MELATONIN-B6 PO) Take 1 tablet by mouth at bedtime.    . primidone (MYSOLINE) 50 MG tablet Take 0.5 tablets (25 mg total) by mouth at bedtime. 45 tablet 1  . pyridOXINE (VITAMIN B-6) 100 MG tablet Take 100 mg by mouth daily.    Marland Kitchen rOPINIRole (REQUIP) 0.25 MG tablet Take 1 tablet (0.25 mg total) by mouth at bedtime. 90 tablet 1  . tiotropium (SPIRIVA) 18 MCG inhalation capsule Place 1 capsule (18 mcg total) into inhaler and inhale daily. 90 capsule 3  . traMADol (ULTRAM) 50 MG tablet Take 1 tablet (50 mg total) by mouth every 6 (six) hours as needed. 90 tablet 0  . valsartan-hydrochlorothiazide (DIOVAN-HCT) 320-12.5 MG tablet Take 1 tablet by mouth daily. 90 tablet 1   No current facility-administered medications on file prior to visit.     LABS/IMAGING: No results found for this or any previous visit (from the past 48 hour(s)). No results found.  WEIGHTS: Wt Readings from Last 3 Encounters:  03/29/16 246 lb (111.6 kg)  02/11/16 237 lb 12.8 oz (107.9 kg)  01/25/16  242 lb (109.8 kg)    VITALS: BP 110/76   Pulse 99   Ht 5\' 8"  (1.727 m)   Wt 246 lb (111.6 kg)   BMI 37.40 kg/m   EXAM: Deferred  EKG: Deferred  ASSESSMENT: 1. Palpitations / tachycardia - monitor showed sinus tachy with PVC's (01/2016) 2. Chest discomfort- low risk Myoview and EF 65-70% by echo (01/2016) 3. Breast CA 4. Hypertension 5. Low risk for surgery in 04/2016  PLAN: 1.   Brandi Stephens had a low risk Myoview stress test and normal LV function by echo with mild diastolic dysfunction. She also has had tachycardia with PVCs. Some of her symptoms may be related to tachycardia in the setting of diastolic dysfunction and PVCs. She would benefit from multiple reasons with low-dose beta blockade. This should suppress her PVCs,  improve her tachycardia and exercise performance and reduce the effects of her diastolic dysfunction in the setting of tachycardia. Hopefully this will encourage more activity, but right now she is being limited by orthopedic problems. She plans to have rotator cuff surgery by Dr. Mardelle Matte in January. She is at low risk for that surgery.  Follow-up with me in 3 months.  [name], MD, [facility] Attending Cardiologist [place] 03/29/2016, 3:43 PM

## 2016-03-29 NOTE — Patient Instructions (Signed)
Your physician has recommended you make the following change in your medication...  1. START metoprolol succinate (Toprol XL) 12.5mg  once daily  Your physician recommends that you schedule a follow-up appointment in: [place] with Dr. Debara Pickett

## 2016-04-13 ENCOUNTER — Telehealth: Payer: Self-pay | Admitting: Oncology

## 2016-04-13 ENCOUNTER — Encounter: Payer: Commercial Managed Care - HMO | Admitting: Adult Health

## 2016-04-13 NOTE — Telephone Encounter (Signed)
Patient called to reschedule lab and follow up appointments from January to February. 04/13/16

## 2016-04-13 NOTE — Telephone Encounter (Signed)
Patient called to reschedule SCP appointment from January to February 2018. (04/13/16)

## 2016-04-15 ENCOUNTER — Telehealth: Payer: Self-pay | Admitting: Internal Medicine

## 2016-04-15 NOTE — Telephone Encounter (Signed)
New message      Request for surgical clearance:  What type of surgery is being performed?  Shoulder scope and lt rotator cuff repair 1. When is this surgery scheduled? 04-28-16  2. Are there any medications that need to be held prior to surgery and how long? Need cardiac clearance  Name of physician performing surgery? Dr Mardelle Matte What is your office phone and fax number?  Fax [phone]

## 2016-04-15 NOTE — Telephone Encounter (Signed)
OV note from 12/12 faxed to [phone] with clearance.

## 2016-04-18 HISTORY — PX: ROTATOR CUFF REPAIR: SHX139

## 2016-04-21 NOTE — Telephone Encounter (Signed)
Received fax from [facility] requesting clearance - already addressed as noted below.  Office note stating patient is cleared printed and manually faxed.

## 2016-04-25 ENCOUNTER — Other Ambulatory Visit: Payer: Self-pay | Admitting: Oncology

## 2016-04-25 NOTE — Progress Notes (Unsigned)
Brandi Stephens  [phone] [phone]     ID: Brandi Stephens DOB: 11-Dec-1945  MR#: [id number]  XIH#:[id number]  Patient Care Team: Girtha Rm, NP as PCP - General (Family Medicine) Chauncey Cruel, MD as Consulting Physician (Oncology) Stark Klein, MD as Consulting Physician (General Surgery) Kyung Rudd, MD as Consulting Physician (Radiation Oncology) [name], MD as Consulting Physician (Family Medicine) [name], MD as Consulting Physician (Neurosurgery) Griselda Miner, MD as Consulting Physician (Dermatology) PCP: Harland Dingwall, NP GYN: OTHER MD:  CHIEF COMPLAINT: Estrogen receptor positive breast cancer  CURRENT TREATMENT: Anastrozole   BREAST CANCER HISTORY: From the original intake note:  Brandi Stephens started to feel some right breast pain and soreness in March 2017. She brought it to Dr. Lanice Shirts nurse practitioner, Brandi Stephens and since attention, and she was set up for bilateral diagnostic mammography with tomography at the [facility] 07/02/2015 area did the breast density was category C. The patient is status post bilateral reduction mammoplasties. Mammography showed no suspicious masses or calcifications in the right breast, and ultrasonography of the area where she has discomfort shows an area of distortion in the right breast at the 8:00 position 12 cm from the nipple measuring 1.4 cm thought to represent scarring related to the prior reduction surgery, but warranting further evaluation.  Also, in the left breast there was an area of distortion in the upper inner quadrant noted mammographically. Targeted ultrasound evaluating the left breast found no sonographic correlate to the mammographic findings. Ultrasound evaluation of both axillae were benign.  On 07/13/2015 Brandi Stephens underwent biopsy of the right breast area in question which showed only fibrocystic changes with adenosis. Biopsy of the left breast upper inner  quadrant however showed invasive ductal carcinoma, grade 1, estrogen receptor 90% positive, progesterone receptor 70% positive, WITH strong staining intensity, with an MIB-1 of 3%, and no HER-2 amplification, the signals ratio being 0.97 and the number per cell 1.65.  The patient's subsequent history is as detailed below  INTERVAL HISTORY: Brandi Stephens returns today for follow-up of her early stage stage breast cancer. Since her last visit here she completed her radiation treatments (last dose 11/18/2015.) She generally did well with the treatments, although she still somewhat fatigued. She still has a "sunburn, and itching over the radiation port area. She is using radiaplex for that. She is now ready to discuss anti-estrogens  REVIEW OF SYSTEMS: She continues to have severe back pain, which is fairly disabling. She had extensive surgery late 2016. She sleeps poorly. Her left foot and legs have been swelling. She is constipated. She has a seroma in her surgical breast that she would like taking care of. Is very hard for her to walk with the back problem. This limits her ability to exercise. She is having moderate hot flashes. A detailed review of systems today was otherwise stable  PAST MEDICAL HISTORY: Past Medical History:  Diagnosis Date  . Anxiety   . Arthritis   . Breast cancer ([place])    left breast   . Breast cancer of lower-outer quadrant of right female breast ([place]) 07/14/2015  . COPD (chronic obstructive pulmonary disease) ([place])   . Hemorrhoids   . Hypertension   . Hypothyroidism   . PONV (postoperative nausea and vomiting)   . Prediabetes     PAST SURGICAL HISTORY: Past Surgical History:  Procedure Laterality Date  . ABDOMINAL HYSTERECTOMY  1985  . BASAL CELL CARCINOMA EXCISION  10/15   nose  . BREAST LUMPECTOMY  WITH RADIOACTIVE SEED AND SENTINEL LYMPH NODE BIOPSY Left 09/16/2015   Procedure: BREAST LUMPECTOMY WITH RADIOACTIVE SEED AND SENTINEL LYMPH NODE BIOPSY;  Surgeon: Stark Klein, MD;  Location: [facility];  Service: General;  Laterality: Left;  . BREAST REDUCTION SURGERY  1998  . [place]   hallo  . COLECTOMY  2005   right side  . EYE SURGERY Left 07/2014   cataract removed 3 months later  . HAND SURGERY Left   . [place]  2008  . KNEE ARTHROSCOPY Right 2002  . TONSILLECTOMY    . TUBAL LIGATION      FAMILY HISTORY Family History  Problem Relation Age of Onset  . Atrial fibrillation Mother   . Breast cancer Maternal Aunt   . Lung cancer Maternal Uncle   . Stomach cancer Maternal Grandmother   . Lung cancer Maternal Aunt   . Brain cancer Maternal Aunt   . Prostate cancer Maternal Grandfather   . Stroke Paternal Grandmother   . Cancer Paternal Grandfather   The patient's father died at the age of 24 and her mother at the age of 98. The patient had no brothers, 2 sisters. The patient's mother was diagnosed with uterine cancer at the age of 28. The patient's sister was diagnosed with labial cancer at the age of 57. There is a maternal aunt with a history of breast cancer but the patient does not know at what age she was diagnosed. There is also on the mother's side history of lung cancer brain cancer and stomach cancer.   GYNECOLOGIC HISTORY:  No LMP recorded. Patient has had a hysterectomy.  Menarche age 24 first live birth age 66, the patient is [place] P3. She underwent hysterectomy without salpingo-oophorectomy at age 38. She took hormone replacement only for a few months.   SOCIAL HISTORY:   the patient is a retired Architect. Her husband on is also retired. He used to work for [employer]. Daughter Brandi Stephens lives in [place] and she is a Wellsite geologist, currently not employed. Daughter Brandi Stephens lives in [place] and has a Brewing technologist in social work but is not currently working. Son Brandi Stephens died at age 18 from causes not clear after autopsy. He had had significant neurologic damage following an  accident     ADVANCED DIRECTIVES:  not in place    HEALTH MAINTENANCE: Social History  Substance Use Topics  . Smoking status: Former Smoker    Packs/day: 1.00    Years: 35.00    Types: Cigarettes    Quit date: 03/15/2010  . Smokeless tobacco: Not on file  . Alcohol use 0.6 oz/week    1 Glasses of wine per week     Colonoscopy: 2015/[facility]   PAP:  Bone density: 2016  Lipid panel:  Allergies  Allergen Reactions  . Ampicillin Hives    Severe reaction in February 2017 1.5 month to have hives to go away  . Codeine Nausea And Vomiting  . Morphine And Related Itching  . Penicillins Other (See Comments)    Has patient had a PCN reaction causing immediate rash, facial/tongue/throat swelling, SOB or lightheadedness with hypotension: No Has patient had a PCN reaction causing severe rash involving mucus membranes or skin necrosis: No Has patient had a PCN reaction that required hospitalization No Has patient had a PCN reaction occurring within the last 10 years: No If all of the above answers are "NO", then may proceed with Cephalosporin use.  Current Outpatient Prescriptions  Medication Sig Dispense Refill  . allopurinol (ZYLOPRIM) 100 MG tablet Take 2 tablets (200 mg total) by mouth daily. 180 tablet 0  . aluminum chloride (DRYSOL) 20 % external solution Apply topically at bedtime. (Patient taking differently: Apply topically at bedtime. ON HOLD) 35 mL 0  . anastrozole (ARIMIDEX) 1 MG tablet Take 1 tablet (1 mg total) by mouth daily. 90 tablet 4  . atorvastatin (LIPITOR) 20 MG tablet Take 1 tablet (20 mg total) by mouth daily. 90 tablet 1  . BIOTIN 5000 PO Take 5,000 capsules by mouth daily.     . fluticasone (FLOVENT HFA) 110 MCG/ACT inhaler Inhale 2 puffs into the lungs 2 (two) times daily. 3 Inhaler 1  . furosemide (LASIX) 20 MG tablet Take 20 mg by mouth daily as needed. Reported on 10/28/2015    . HYDROcodone-acetaminophen (NORCO) 5-325 MG tablet Take 1 tablet by mouth  every 6 (six) hours as needed for moderate pain. 10 tablet 0  . KLOR-CON 10 10 MEQ tablet Take 10 mEq by mouth as needed. Reported on 10/28/2015    . levothyroxine (SYNTHROID, LEVOTHROID) 75 MCG tablet Take 1 tablet (75 mcg total) by mouth daily. 90 tablet 1  . MAGNESIUM CITRATE PO Take 2 tablets by mouth at bedtime. Reported on 09/29/2015    . Melatonin-Pyridoxine (MELATONIN-B6 PO) Take 1 tablet by mouth at bedtime.    . metoprolol succinate (TOPROL-XL) 25 MG 24 hr tablet Take 0.5 tablets (12.5 mg total) by mouth daily. 45 tablet 3  . primidone (MYSOLINE) 50 MG tablet Take 0.5 tablets (25 mg total) by mouth at bedtime. 45 tablet 1  . pyridOXINE (VITAMIN B-6) 100 MG tablet Take 100 mg by mouth daily.    Marland Kitchen rOPINIRole (REQUIP) 0.25 MG tablet Take 1 tablet (0.25 mg total) by mouth at bedtime. 90 tablet 1  . tiotropium (SPIRIVA) 18 MCG inhalation capsule Place 1 capsule (18 mcg total) into inhaler and inhale daily. 90 capsule 3  . traMADol (ULTRAM) 50 MG tablet Take 1 tablet (50 mg total) by mouth every 6 (six) hours as needed. 90 tablet 0  . valsartan-hydrochlorothiazide (DIOVAN-HCT) 320-12.5 MG tablet Take 1 tablet by mouth daily. 90 tablet 1   No current facility-administered medications for this visit.     OBJECTIVE: Middle-aged white woman Who appears stated age There were no vitals filed for this visit.   There is no height or weight on file to calculate BMI.    ECOG FS:2 - Symptomatic, <50% confined to bed  Sclerae unicteric, EOMs intact Oropharynx clear and moist No cervical or supraclavicular adenopathy Lungs no rales or rhonchi Heart regular rate and rhythm Abd soft, obese, nontender, positive bowel sounds MSK no focal spinal tenderness, left lower extremity lymphedema without erythema Neuro: nonfocal, well oriented, appropriate affect Breasts: The right breast is unremarkable. The left breast is status post lumpectomy and radiation. There is still some hyperpigmentation. There is a  4-5 cm seroma in the superior central aspect of the breast, with no associated erythema. There is some tenderness. There is no evidence of local recurrence. The left axilla is benign.  LAB RESULTS:  CMP     Component Value Date/Time   NA 139 11/25/2015 1604   NA 140 07/22/2015 1227   K 4.8 11/25/2015 1604   K 5.1 07/22/2015 1227   CL 109 11/25/2015 1604   CO2 22 11/25/2015 1604   CO2 26 07/22/2015 1227   GLUCOSE 105 (H) 11/25/2015 1604   GLUCOSE  100 07/22/2015 1227   BUN 20 11/25/2015 1604   BUN 25.0 07/22/2015 1227   CREATININE 0.94 11/25/2015 1604   CREATININE 1.3 (H) 07/22/2015 1227   CALCIUM 9.7 11/25/2015 1604   CALCIUM 9.8 07/22/2015 1227   PROT 7.1 07/22/2015 1227   ALBUMIN 3.6 07/22/2015 1227   AST 13 07/22/2015 1227   ALT 13 07/22/2015 1227   ALKPHOS 83 07/22/2015 1227   BILITOT 0.30 07/22/2015 1227   GFRNONAA >60 05/20/2015 0820   GFRAA >60 05/20/2015 0820    INo results found for: SPEP, UPEP  Lab Results  Component Value Date   WBC 11.9 (H) 11/25/2015   NEUTROABS 8,568 (H) 11/25/2015   HGB 14.9 11/25/2015   HCT 45.2 (H) 11/25/2015   MCV 94.6 11/25/2015   PLT 251 11/25/2015      Chemistry      Component Value Date/Time   NA 139 11/25/2015 1604   NA 140 07/22/2015 1227   K 4.8 11/25/2015 1604   K 5.1 07/22/2015 1227   CL 109 11/25/2015 1604   CO2 22 11/25/2015 1604   CO2 26 07/22/2015 1227   BUN 20 11/25/2015 1604   BUN 25.0 07/22/2015 1227   CREATININE 0.94 11/25/2015 1604   CREATININE 1.3 (H) 07/22/2015 1227      Component Value Date/Time   CALCIUM 9.7 11/25/2015 1604   CALCIUM 9.8 07/22/2015 1227   ALKPHOS 83 07/22/2015 1227   AST 13 07/22/2015 1227   ALT 13 07/22/2015 1227   BILITOT 0.30 07/22/2015 1227       No results found for: LABCA2  No components found for: LABCA125  No results for input(s): INR in the last 168 hours.  Urinalysis    Component Value Date/Time   COLORURINE YELLOW 05/15/2015 [place]  05/15/2015 1218   LABSPEC 1.012 05/15/2015 1218   PHURINE 5.0 05/15/2015 1218   GLUCOSEU NEGATIVE 05/15/2015 1218   HGBUR NEGATIVE 05/15/2015 1218   BILIRUBINUR n 11/25/2015 [place] 05/15/2015 1218   PROTEINUR n 11/25/2015 1712   PROTEINUR NEGATIVE 05/15/2015 1218   UROBILINOGEN negative 11/25/2015 1712   NITRITE n 11/25/2015 1712   NITRITE NEGATIVE 05/15/2015 1218   LEUKOCYTESUR small (1+) (A) 11/25/2015 1712      ELIGIBLE FOR AVAILABLE RESEARCH PROTOCOL: no  STUDIES: No results found.  ASSESSMENT: 71 y.o. [place] woman  Status post left breast upper inner quadrant biopsy 07/13/2015 for a clinical [place] N0 invasive ductal carcinoma, grade 1, estrogen and progesterone receptor positive, HER-2 nonamplified, with an MIB-1 of 3%.  (a) biopsy of a suspicious lesion in the right breast proved to be a small fibroadenoma  (1) left lumpectomy and sentinel lymph node sampling 09/16/2015 showed a pT1a pN0 invasive ductal carcinoma, grade 1, with negative margins, stage IA    (2) adjuvant radiation completed 11/18/2015  (3) To start anastrozole 02/16/2010.  PLAN: Brandi Stephens is finally ready to discuss anti-estrogens.  '@PATIENTFIRSTNAME' @   has completed her local treatment and is now ready to start anti-estrogens. We reiewed the difference between tamoxifen and anastrozole in detail. She understands that anastrozole and the aromatase inhibitors in general work by blocking estrogen production. Accordingly vaginal dryness, decrease in bone density, and of course hot flashes can result. The aromatase inhibitors can also negatively affect the cholesterol profile, although that is a minor effect. One out of 5 women on aromatase inhibitors we will feel "old and achy". This arthralgia/myalgia syndrome, which resembles fibromyalgia clinically, does resolve with stopping the  medications. Accordingly this is not a reason to not try an aromatase inhibitor but it is a frequent reason to stop  it (in other words 20% of women will not be able to tolerate these medications).  Tamoxifen on the other hand does not block estrogen production. It does not "take away a woman's estrogen". It blocks the estrogen receptor in breast cells. Like anastrozole, it can also cause hot flashes. As opposed to anastrozole, tamoxifen has many estrogen-like effects. It is technically an estrogen receptor modulator. This means that in some tissues tamoxifen works like estrogen-- for example it helps strengthen the bones. It tends to improve the cholesterol profile. It can cause thickening of the endometrial lining, and even endometrial polyps or rarely cancer of the uterus.(The risk of uterine cancer due to tamoxifen is one additional cancer per thousand women year). It can cause vaginal wetness or stickiness. It can cause blood clots through this estrogen-like effect--the risk of blood clots with tamoxifen is exactly the same as with birth control pills or hormone replacement.  Neither of these agents causes mood changes or weight gain, despite the popular belief that they can have these side effects. We have data from studies comparing either of these drugs with placebo, and in those cases the control group had the same amount of weight gain and depression as the group that took the drug.  After much discussion we decided anastrozole would be better for her, however she is not psychologically ready to proceed with this. There are 2 many other issues that have not yet settled down. In particular she wants this aromatase be more diminished and the back pain to be under better control.  Accordingly we are not starting anastrozole until 02/17/2016. She will see me again early next year to discuss tolerance. If she tolerates it well and will start seeing her on a once a year basis  Incidentally I am concerned about the left lower extremity lymphedema noted above. I'm setting her for a Doppler ultrasound  today.    Chauncey Cruel, MD   04/25/2016 10:27 AM Medical Oncology and Hematology [facility] [address] [place], [place] [address] Tel. [phone]    Fax. [phone]   ADDENDUM: Doppler ultrasonography of the left lower extremity today showed no evidence of clot.

## 2016-04-28 DIAGNOSIS — M75102 Unspecified rotator cuff tear or rupture of left shoulder, not specified as traumatic: Secondary | ICD-10-CM | POA: Diagnosis not present

## 2016-04-28 DIAGNOSIS — S43492A Other sprain of left shoulder joint, initial encounter: Secondary | ICD-10-CM | POA: Diagnosis not present

## 2016-04-28 DIAGNOSIS — G8918 Other acute postprocedural pain: Secondary | ICD-10-CM | POA: Diagnosis not present

## 2016-04-28 DIAGNOSIS — M19012 Primary osteoarthritis, left shoulder: Secondary | ICD-10-CM | POA: Diagnosis not present

## 2016-04-28 DIAGNOSIS — Y999 Unspecified external cause status: Secondary | ICD-10-CM | POA: Diagnosis not present

## 2016-04-28 DIAGNOSIS — M7542 Impingement syndrome of left shoulder: Secondary | ICD-10-CM | POA: Diagnosis not present

## 2016-04-28 DIAGNOSIS — M75122 Complete rotator cuff tear or rupture of left shoulder, not specified as traumatic: Secondary | ICD-10-CM | POA: Diagnosis not present

## 2016-04-28 DIAGNOSIS — M24112 Other articular cartilage disorders, left shoulder: Secondary | ICD-10-CM | POA: Diagnosis not present

## 2016-04-28 DIAGNOSIS — S46012A Strain of muscle(s) and tendon(s) of the rotator cuff of left shoulder, initial encounter: Secondary | ICD-10-CM | POA: Diagnosis not present

## 2016-04-28 DIAGNOSIS — M7522 Bicipital tendinitis, left shoulder: Secondary | ICD-10-CM | POA: Diagnosis not present

## 2016-04-29 ENCOUNTER — Encounter: Payer: Commercial Managed Care - HMO | Admitting: Adult Health

## 2016-05-02 ENCOUNTER — Telehealth: Payer: Self-pay | Admitting: Oncology

## 2016-05-02 ENCOUNTER — Encounter: Payer: Self-pay | Admitting: Family Medicine

## 2016-05-02 NOTE — Telephone Encounter (Signed)
PATIENT CALLED AND MOVED LAB/FU FROM 2/5 AND 2/12 TO 3/19 AND 3/26 DUE TO SURGERY - PATIENT HAS NEW DATE/TIME. PER PATIENT SCP VISIT ON 2/23 CAN REMAIN AS SCHEDULED

## 2016-05-09 ENCOUNTER — Other Ambulatory Visit: Payer: Commercial Managed Care - HMO

## 2016-05-11 DIAGNOSIS — M19012 Primary osteoarthritis, left shoulder: Secondary | ICD-10-CM | POA: Diagnosis not present

## 2016-05-16 ENCOUNTER — Ambulatory Visit: Payer: Commercial Managed Care - HMO | Admitting: Oncology

## 2016-05-23 ENCOUNTER — Other Ambulatory Visit: Payer: Commercial Managed Care - HMO

## 2016-05-25 ENCOUNTER — Ambulatory Visit: Payer: Self-pay | Admitting: Family Medicine

## 2016-05-30 ENCOUNTER — Ambulatory Visit: Payer: Commercial Managed Care - HMO | Admitting: Oncology

## 2016-06-01 ENCOUNTER — Telehealth: Payer: Self-pay

## 2016-06-01 NOTE — Telephone Encounter (Signed)
Ok to refill 

## 2016-06-01 NOTE — Telephone Encounter (Signed)
Pt needs refills of synthroid, atorvastatin, tramadol, valsartan-hctz, primidone sent to [name].

## 2016-06-02 MED ORDER — LEVOTHYROXINE SODIUM 75 MCG PO TABS: 75.0000 ug | ORAL_TABLET | Freq: Every day | ORAL | 1 refills | Status: DC

## 2016-06-02 MED ORDER — ATORVASTATIN CALCIUM 20 MG PO TABS: 20.0000 mg | ORAL_TABLET | Freq: Every day | ORAL | 1 refills | Status: DC

## 2016-06-02 MED ORDER — PRIMIDONE 50 MG PO TABS: 25.0000 mg | ORAL_TABLET | Freq: Every day | ORAL | 1 refills | Status: DC

## 2016-06-02 MED ORDER — TRAMADOL HCL 50 MG PO TABS
50.0000 mg | ORAL_TABLET | Freq: Four times a day (QID) | ORAL | 0 refills | Status: DC | PRN
Start: 2016-06-02 — End: 2016-11-04

## 2016-06-02 MED ORDER — VALSARTAN-HYDROCHLOROTHIAZIDE 320-12.5 MG PO TABS: 1.0000 | ORAL_TABLET | Freq: Every day | ORAL | 1 refills | Status: DC

## 2016-06-02 NOTE — Telephone Encounter (Signed)
Sent in meds and called in tramadol to [facility] on [address]

## 2016-06-07 ENCOUNTER — Telehealth: Payer: Self-pay | Admitting: Oncology

## 2016-06-07 NOTE — Telephone Encounter (Signed)
Pt called to r/s appt to March. Pt has new appt date/time

## 2016-06-08 DIAGNOSIS — S46012D Strain of muscle(s) and tendon(s) of the rotator cuff of left shoulder, subsequent encounter: Secondary | ICD-10-CM | POA: Diagnosis not present

## 2016-06-10 ENCOUNTER — Encounter: Payer: Commercial Managed Care - HMO | Admitting: Adult Health

## 2016-06-15 DIAGNOSIS — L821 Other seborrheic keratosis: Secondary | ICD-10-CM | POA: Diagnosis not present

## 2016-06-15 DIAGNOSIS — L723 Sebaceous cyst: Secondary | ICD-10-CM | POA: Diagnosis not present

## 2016-06-15 DIAGNOSIS — Z85828 Personal history of other malignant neoplasm of skin: Secondary | ICD-10-CM | POA: Diagnosis not present

## 2016-06-15 DIAGNOSIS — L853 Xerosis cutis: Secondary | ICD-10-CM | POA: Diagnosis not present

## 2016-06-15 DIAGNOSIS — D225 Melanocytic nevi of trunk: Secondary | ICD-10-CM | POA: Diagnosis not present

## 2016-06-21 DIAGNOSIS — M75112 Incomplete rotator cuff tear or rupture of left shoulder, not specified as traumatic: Secondary | ICD-10-CM | POA: Diagnosis not present

## 2016-06-21 DIAGNOSIS — M25612 Stiffness of left shoulder, not elsewhere classified: Secondary | ICD-10-CM | POA: Diagnosis not present

## 2016-06-21 DIAGNOSIS — M25512 Pain in left shoulder: Secondary | ICD-10-CM | POA: Diagnosis not present

## 2016-06-22 DIAGNOSIS — M75112 Incomplete rotator cuff tear or rupture of left shoulder, not specified as traumatic: Secondary | ICD-10-CM | POA: Diagnosis not present

## 2016-06-22 DIAGNOSIS — M25612 Stiffness of left shoulder, not elsewhere classified: Secondary | ICD-10-CM | POA: Diagnosis not present

## 2016-06-22 DIAGNOSIS — M25512 Pain in left shoulder: Secondary | ICD-10-CM | POA: Diagnosis not present

## 2016-06-28 ENCOUNTER — Encounter: Payer: Self-pay | Admitting: Adult Health

## 2016-06-28 ENCOUNTER — Other Ambulatory Visit: Payer: Self-pay | Admitting: *Deleted

## 2016-06-28 ENCOUNTER — Ambulatory Visit (HOSPITAL_BASED_OUTPATIENT_CLINIC_OR_DEPARTMENT_OTHER): Payer: PPO

## 2016-06-28 ENCOUNTER — Ambulatory Visit (HOSPITAL_BASED_OUTPATIENT_CLINIC_OR_DEPARTMENT_OTHER): Payer: PPO | Admitting: Adult Health

## 2016-06-28 VITALS — BP 113/68 | HR 82 | Temp 98.0°F | Resp 18 | Ht 68.0 in | Wt 238.8 lb

## 2016-06-28 DIAGNOSIS — Z170 Estrogen receptor positive status [ER+]: Secondary | ICD-10-CM | POA: Diagnosis not present

## 2016-06-28 DIAGNOSIS — M8589 Other specified disorders of bone density and structure, multiple sites: Secondary | ICD-10-CM | POA: Diagnosis not present

## 2016-06-28 DIAGNOSIS — C50212 Malignant neoplasm of upper-inner quadrant of left female breast: Secondary | ICD-10-CM

## 2016-06-28 DIAGNOSIS — N6310 Unspecified lump in the right breast, unspecified quadrant: Secondary | ICD-10-CM

## 2016-06-28 DIAGNOSIS — E039 Hypothyroidism, unspecified: Secondary | ICD-10-CM | POA: Diagnosis not present

## 2016-06-28 LAB — COMPREHENSIVE METABOLIC PANEL
ALBUMIN: 3.6 g/dL (ref 3.5–5.0)
ALT: 22 U/L (ref 0–55)
AST: 18 U/L (ref 5–34)
Alkaline Phosphatase: 101 U/L (ref 40–150)
Anion Gap: 10 mEq/L (ref 3–11)
BUN: 23.6 mg/dL (ref 7.0–26.0)
CHLORIDE: 108 meq/L (ref 98–109)
CO2: 24 mEq/L (ref 22–29)
Calcium: 10.3 mg/dL (ref 8.4–10.4)
Creatinine: 1 mg/dL (ref 0.6–1.1)
EGFR: 57 mL/min/{1.73_m2} — ABNORMAL LOW (ref >90)
GLUCOSE: 114 mg/dL (ref 70–140)
POTASSIUM: 5.2 meq/L — AB (ref 3.5–5.1)
SODIUM: 142 meq/L (ref 136–145)
Total Bilirubin: 0.48 mg/dL (ref 0.20–1.20)
Total Protein: 6.8 g/dL (ref 6.4–8.3)

## 2016-06-28 LAB — CBC WITH DIFFERENTIAL/PLATELET
BASO%: 0.8 % (ref 0.0–2.0)
Basophils Absolute: 0.1 10*3/uL (ref 0.0–0.1)
EOS ABS: 0.4 10*3/uL (ref 0.0–0.5)
EOS%: 3.2 % (ref 0.0–7.0)
HCT: 46.8 % — ABNORMAL HIGH (ref 34.8–46.6)
HGB: 15.4 g/dL (ref 11.6–15.9)
LYMPH%: 14.6 % (ref 14.0–49.7)
MCH: 32 pg (ref 25.1–34.0)
MCHC: 32.8 g/dL (ref 31.5–36.0)
MCV: 97.6 fL (ref 79.5–101.0)
MONO#: 0.6 10*3/uL (ref 0.1–0.9)
MONO%: 5.7 % (ref 0.0–14.0)
NEUT#: 8.7 10*3/uL — ABNORMAL HIGH (ref 1.5–6.5)
NEUT%: 75.7 % (ref 38.4–76.8)
PLATELETS: 242 10*3/uL (ref 145–400)
RBC: 4.8 10*6/uL (ref 3.70–5.45)
RDW: 14.6 % — ABNORMAL HIGH (ref 11.2–14.5)
WBC: 11.4 10*3/uL — ABNORMAL HIGH (ref 3.9–10.3)
lymph#: 1.7 10*3/uL (ref 0.9–3.3)

## 2016-06-28 NOTE — Telephone Encounter (Signed)
ONLY WANTED HALF THE METOPROLOL REFILL, I ADVISED HER TO ASK THE PHARMACY TO GIVE HER JUST ENOUGH TABLETS TO GET HER TO THE VISIT AS SHE WANTED TO DO, PT EXPRESSED UNDERSTANDING.

## 2016-06-28 NOTE — Progress Notes (Signed)
CLINIC:  Survivorship   REASON FOR VISIT:  Routine follow-up post-treatment for a recent history of breast cancer.  BRIEF ONCOLOGIC HISTORY:    Breast cancer of upper-inner quadrant of left female breast ([place])   07/02/2015 Mammogram    1. Indeterminate left breast distortion. 2. Area of right breast distortion seen sonographically in the area of patient's focal pain. While this is favored to represent scarring related to patient's prior reduction surgery, biopsy is recommended to exclude malignancy.      07/13/2015 Initial Biopsy    1.Left Breast biopsy: IDC, lobular neoplasia, fibrocystic changes, ER+ (90%), PR+(70%), Ki67 3%, HER-2 negative (ratio 0.97). 2. Right breast core biopsy: fibrocystic changes with adenosis and calcifications.      07/14/2015 Initial Diagnosis    Breast cancer of upper-inner quadrant of left female breast ([place])     09/16/2015 Surgery    Left breast lumpectomy [facility]): focal IDC, grade 1, atypical ductal hyperplasia, lobular neoplasia, margins negative, 2 SLN negative for metastases, ER+(90%), PR+(70%), Ki-67 3%, HER-2 negative.      10/22/2015 - 11/18/2015 Radiation Therapy    Adjuvant Radiation [facility]): The patient initially received a dose of 42.5 Gy in 17 fractions to the breast, boost of  7.5 Gy in 3 fractions to seroma.      02/17/2016 -  Anti-estrogen oral therapy    Anastrozole 23m daily       INTERVAL HISTORY:  Brandi Stephens for our initial meeting to review her survivorship care plan detailing her treatment course for breast cancer, as well as monitoring long-term side effects of that treatment, education regarding health maintenance, screening, and overall wellness and health promotion.     Overall, Brandi Stephens feeling quite well.  She has undergone a couple of other surgeries, one on her left rotator cuff in January, and is about to require surgery on her back.  She is being positive about this  however.    She was prescribed anastrozole however three days after starting this she noted an itchy rash and fever, and she was told to stop taking it.  Dr. MJana Stephens for her not to restart it until she meets with him later this month.      REVIEW OF SYSTEMS:  Review of Systems  Constitutional: Negative for chills, fever and malaise/fatigue.  HENT: Negative for hearing loss and tinnitus.   Eyes: Negative for blurred vision and double vision.  Respiratory: Negative for cough and shortness of breath.   Cardiovascular: Negative for chest pain, palpitations and leg swelling.  Gastrointestinal: Negative for abdominal pain, blood in stool, constipation, diarrhea, heartburn, melena, nausea and vomiting.  Musculoskeletal: Positive for back pain and joint pain.  Skin: Negative for rash.  Neurological: Negative for dizziness, weakness and headaches.  Endo/Heme/Allergies: Negative for environmental allergies. Does not bruise/bleed easily.  Psychiatric/Behavioral: Negative for depression. The patient is not nervous/anxious.      Breast: Denies any new nodularity, masses, tenderness, nipple changes, or nipple discharge.      ONCOLOGY TREATMENT TEAM:  1. Surgeon[facility]  [facility] 2. Medical Oncologist: Dr. MJana Stephens 3. Radiation Oncologist: Dr. MLisbeth Stephens   PAST MEDICAL/SURGICAL HISTORY:  Past Medical History:  Diagnosis Date  . Anxiety   . Arthritis   . [place] cancer [place]    [place] breast   . Breast cancer of [place]-outer quadrant of right [place] breast [place] 07[place]  . COPD (chronic obstructive pulmonary disease) ([place]   .  Hemorrhoids   . Hypertension   . Hypothyroidism   . PONV (postoperative nausea and vomiting)   . Prediabetes    Past Surgical History:  Procedure Laterality Date  . ABDOMINAL HYSTERECTOMY  1985  . BASAL CELL CARCINOMA EXCISION  10/15   nose  . BREAST LUMPECTOMY WITH RADIOACTIVE SEED AND SENTINEL LYMPH NODE BIOPSY Left 09/16/2015    Procedure: BREAST LUMPECTOMY WITH RADIOACTIVE SEED AND SENTINEL LYMPH NODE BIOPSY;  Surgeon: Brandi Klein, MD;  Location: [place];  Service: General;  Laterality: Left;  . BREAST REDUCTION SURGERY  1998  . [place]   hallo  . COLECTOMY  2005   right side  . EYE SURGERY Left 07/2014   cataract removed 3 months later  . HAND SURGERY Left   . [place]  2008  . KNEE ARTHROSCOPY Right 2002  . TONSILLECTOMY    . TUBAL LIGATION       ALLERGIES:  Allergies  Allergen Reactions  . Ampicillin Hives    Severe reaction in February 2017 1.5 month to have hives to go away  . Codeine Nausea And Vomiting  . Morphine And Related Itching  . Penicillins Other (See Comments)    Has patient had a PCN reaction causing immediate rash, facial/tongue/throat swelling, SOB or lightheadedness with hypotension: No Has patient had a PCN reaction causing severe rash involving mucus membranes or skin necrosis: No Has patient had a PCN reaction that required hospitalization No Has patient had a PCN reaction occurring within the last 10 years: No If all of the above answers are "NO", then may proceed with Cephalosporin use.      CURRENT MEDICATIONS:  Outpatient Encounter Prescriptions as of 06/28/2016  Medication Sig Note  . allopurinol (ZYLOPRIM) 100 MG tablet Take 2 tablets (200 mg total) by mouth daily.   Marland Kitchen atorvastatin (LIPITOR) 20 MG tablet Take 1 tablet (20 mg total) by mouth daily.   Marland Kitchen BIOTIN 5000 PO Take 5,000 capsules by mouth daily.    . fluticasone (FLOVENT HFA) 110 MCG/ACT inhaler Inhale 2 puffs into the lungs 2 (two) times daily.   . furosemide (LASIX) 20 MG tablet Take 20 mg by mouth daily as needed. Reported on 10/28/2015 02/03/2016: Takes as needed  . HYDROcodone-acetaminophen (NORCO) 5-325 MG tablet Take 1 tablet by mouth every 6 (six) hours as needed for moderate pain.   Marland Kitchen KLOR-CON 10 10 MEQ tablet Take 10 mEq by mouth as needed. Reported on  10/28/2015   . levothyroxine (SYNTHROID, LEVOTHROID) 75 MCG tablet Take 1 tablet (75 mcg total) by mouth daily.   Marland Kitchen MAGNESIUM CITRATE PO Take 2 tablets by mouth at bedtime. Reported on 09/29/2015   . Melatonin-Pyridoxine (MELATONIN-B6 PO) Take 1 tablet by mouth at bedtime.   . metoprolol succinate (TOPROL-XL) 25 MG 24 hr tablet Take 0.5 tablets (12.5 mg total) by mouth daily.   . primidone (MYSOLINE) 50 MG tablet Take 0.5 tablets (25 mg total) by mouth at bedtime.   . pyridOXINE (VITAMIN B-6) 100 MG tablet Take 100 mg by mouth daily.   Marland Kitchen rOPINIRole (REQUIP) 0.25 MG tablet Take 1 tablet (0.25 mg total) by mouth at bedtime.   Marland Kitchen tiotropium (SPIRIVA) 18 MCG inhalation capsule Place 1 capsule (18 mcg total) into inhaler and inhale daily.   . traMADol (ULTRAM) 50 MG tablet Take 1 tablet (50 mg total) by mouth every 6 (six) hours as needed.   . valsartan-hydrochlorothiazide (DIOVAN-HCT) 320-12.5 MG tablet Take 1 tablet by  mouth daily.   Marland Kitchen aluminum chloride (DRYSOL) 20 % external solution Apply topically at bedtime. (Patient not taking: Reported on 06/28/2016)   . anastrozole (ARIMIDEX) 1 MG tablet Take 1 tablet (1 mg total) by mouth daily. (Patient not taking: Reported on 06/28/2016)    No facility-administered encounter medications on file as of 06/28/2016.      ONCOLOGIC FAMILY HISTORY:  Family History  Problem Relation Age of Onset  . Atrial fibrillation Mother   . Breast cancer Maternal Aunt   . Lung cancer Maternal Uncle   . Stomach cancer Maternal Grandmother   . Lung cancer Maternal Aunt   . Brain cancer Maternal Aunt   . Prostate cancer Maternal Grandfather   . Stroke Paternal Grandmother   . Cancer Paternal Grandfather        SOCIAL HISTORY:  Brandi Stephens is married and lives with her husband in [place], [place].  She has 2 children and they live in in [place] also.  She enjoys spending time with her 85 year old grand-daughter.  Brandi Stephens is currently retired.  She  denies any current or history of tobacco, alcohol, or illicit drug use.     PHYSICAL EXAMINATION:  Vital Signs:   Vitals:   06/28/16 1407  BP: 113/68  Pulse: 82  Resp: 18  Temp: 98 F (36.7 C)   Filed Weights   06/28/16 1407  Weight: 238 lb 12.8 oz (108.3 kg)   General: Well-nourished, well-appearing female in no acute distress.  She is unaccompanied today.   HEENT: Head is normocephalic.  Pupils equal and reactive to light. Conjunctivae clear without exudate.  Sclerae anicteric. Oral mucosa is pink, moist.  Oropharynx is pink without lesions or erythema.  Lymph: No cervical, supraclavicular, or infraclavicular lymphadenopathy noted on palpation.  Cardiovascular: Regular rate and rhythm.Marland Kitchen Respiratory: Clear to auscultation bilaterally. Chest expansion symmetric; breathing non-labored.  GI: Abdomen soft and round; non-tender, non-distended. Bowel sounds normoactive.  GU: Deferred.  Neuro: No focal deficits. Steady gait.  Psych: Mood and affect normal and appropriate for situation.  Extremities: No edema. Skin: Warm and dry.  LABORATORY DATA:  None for this visit.  DIAGNOSTIC IMAGING:  None for this visit.      ASSESSMENT AND PLAN:  Ms.. Stephens is a pleasant 71 y.o. 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  She presents to the Survivorship Clinic for our initial meeting and routine follow-up post-completion of treatment for breast cancer.    1. Stage IA left breast cancer:  Brandi Stephens is continuing to recover from definitive treatment for breast cancer. She will follow-up with her medical oncologist, Dr. Jana Stephens in two weeks with history and physical exam per surveillance protocol.   Today, a comprehensive survivorship care plan and treatment summary was reviewed with the patient today detailing  her breast cancer diagnosis, treatment course, potential late/long-term effects of treatment, appropriate follow-up care with recommendations for the future, and patient education resources.  A copy of this summary, along with a letter will be sent to the patient's primary care provider via mail/fax/In Basket message after today's visit.  A mammogram was ordered today for her to have done this month at GI-the breast center.  2. Bone health:  Given Brandi Stephens of breast cancer, h/o osteopenia, and her current treatment regimen including anti-estrogen therapy with Anastrozole, she is at risk for bone demineralization.  Her last DEXA scan was in 2015, which showed osteopenia.  I have ordered a repeat bone density to be done when she has her mammogram, which is due this month.  In the meantime, she was encouraged to increase her consumption of foods rich in calcium, as well as increase her weight-bearing activities.  She was given education on specific activities to promote bone health.  3. Cancer screening:  Due to Brandi Stephens's history and her age, she should receive screening for skin cancers, colon cancer, and gynecologic cancers.  The information and recommendations are listed on the patient's comprehensive care plan/treatment summary and were reviewed in detail with the patient.    4. Health maintenance and wellness promotion: Ms. Dinning was encouraged to consume 5-7 servings of fruits and vegetables per day. We reviewed the "Nutrition Rainbow" handout, as well as the handout "Take Control of Your Health and Reduce Your Cancer Risk" from the [facility].  She was also encouraged to engage in moderate to vigorous exercise for 30 minutes per day most days of the week. We discussed the [employer] fitness program, which is designed for cancer survivors to help them become more physically fit after cancer treatments.  She was instructed to limit her alcohol consumption and continue to  abstain from tobacco use.     5. Support services/counseling: It is not uncommon for this period of the patient's cancer care trajectory to be one of many emotions and stressors.  We discussed an opportunity for her to participate in the next session of [facility] ("Finding Your New Normal") support group series designed for patients after they have completed treatment.   Ms. Godfrey was encouraged to take advantage of our many other support services programs, support groups, and/or counseling in coping with her new life as a cancer survivor after completing anti-cancer treatment.  She was offered support today through active listening and expressive supportive counseling.  She was given information regarding our available services and encouraged to contact me with any questions or for help enrolling in any of our support group/programs.    Dispo:   -Return to cancer center March 26 for follow up and evaluation as scheduled with Dr. Jana Stephens -mammogram and bone density ordered today.  -She is welcome to return back to the Survivorship Clinic at any time; no additional follow-up needed at this time.  -Consider referral back to survivorship as a long-term survivor for continued surveillance  A total of (30) minutes of face-to-face time was spent with this patient with greater than 50% of that time in counseling and care-coordination.   Gardenia Phlegm, [place] [phone]   Note: PRIMARY CARE PROVIDER Harland Dingwall, [place] [phone]

## 2016-06-29 DIAGNOSIS — M25612 Stiffness of left shoulder, not elsewhere classified: Secondary | ICD-10-CM | POA: Diagnosis not present

## 2016-06-29 DIAGNOSIS — M75112 Incomplete rotator cuff tear or rupture of left shoulder, not specified as traumatic: Secondary | ICD-10-CM | POA: Diagnosis not present

## 2016-06-29 DIAGNOSIS — M25512 Pain in left shoulder: Secondary | ICD-10-CM | POA: Diagnosis not present

## 2016-06-30 DIAGNOSIS — M25512 Pain in left shoulder: Secondary | ICD-10-CM | POA: Diagnosis not present

## 2016-06-30 DIAGNOSIS — M25612 Stiffness of left shoulder, not elsewhere classified: Secondary | ICD-10-CM | POA: Diagnosis not present

## 2016-06-30 DIAGNOSIS — M75112 Incomplete rotator cuff tear or rupture of left shoulder, not specified as traumatic: Secondary | ICD-10-CM | POA: Diagnosis not present

## 2016-07-01 ENCOUNTER — Ambulatory Visit: Payer: Self-pay | Admitting: Family Medicine

## 2016-07-04 ENCOUNTER — Other Ambulatory Visit: Payer: Commercial Managed Care - HMO

## 2016-07-05 ENCOUNTER — Ambulatory Visit (INDEPENDENT_AMBULATORY_CARE_PROVIDER_SITE_OTHER): Payer: PPO | Admitting: Internal Medicine

## 2016-07-05 ENCOUNTER — Encounter: Payer: Self-pay | Admitting: Internal Medicine

## 2016-07-05 VITALS — BP 108/72 | HR 75 | Ht 68.0 in | Wt 240.0 lb

## 2016-07-05 DIAGNOSIS — R0602 Shortness of breath: Secondary | ICD-10-CM | POA: Diagnosis not present

## 2016-07-05 DIAGNOSIS — I10 Essential (primary) hypertension: Secondary | ICD-10-CM

## 2016-07-05 DIAGNOSIS — I493 Ventricular premature depolarization: Secondary | ICD-10-CM | POA: Diagnosis not present

## 2016-07-05 NOTE — Progress Notes (Signed)
OFFICE NOTE  Chief Complaint:  No complaints  Primary Care Physician: Harland Dingwall, NP-C  HPI:  Brandi Stephens is a 71 y.o. female who is undergoing radiation treatment for breast cancer. She has been feeling palpitations and chest discomfort recently and was referred for evaluation. She has cardiac risk factors, including 35 pack year smoking history, age, hypertension and prediabetes. She reports her episodes of palpitations and associated chest discomfort last for a few minutes and resolve. Not necessarily at exertion or relieved by rest. Recent CT angio showed aortic atherlosclerosis. She also has unexplained tachycardia at rest.  03/29/2016  Brandi Stephens returns today for follow-up. She underwent nuclear stress test which was negative for ischemia. She also had an echocardiogram which showed normal LV function and mild diastolic dysfunction. She wore a monitor which indicated sinus tachycardia and occasional PVCs. This consistent with her symptoms of intermittent palpitations.  07/05/2016  Brandi Stephens returns today for follow-up. She underwent successful shoulder surgery without complications. She is undergoing recovery. She is taking low-dose metoprolol and finds that she has very infrequent palpitations. She denies any worsening shortness of breath or chest discomfort. Blood pressure is low normal but she is asymptomatic with this.  PMHx:  Past Medical History:  Diagnosis Date  . Anxiety   . Arthritis   . Breast cancer ([place])    left breast   . Breast cancer of lower-outer quadrant of right female breast ([place]) 07/14/2015  . COPD (chronic obstructive pulmonary disease) ([place])   . Hemorrhoids   . Hypertension   . Hypothyroidism   . PONV (postoperative nausea and vomiting)   . Prediabetes     Past Surgical History:  Procedure Laterality Date  . ABDOMINAL HYSTERECTOMY  1985  . BASAL CELL CARCINOMA EXCISION  10/15   nose  . BREAST LUMPECTOMY WITH RADIOACTIVE SEED AND  SENTINEL LYMPH NODE BIOPSY Left 09/16/2015   Procedure: BREAST LUMPECTOMY WITH RADIOACTIVE SEED AND SENTINEL LYMPH NODE BIOPSY;  Surgeon: Stark Klein, MD;  Location: [place];  Service: General;  Laterality: Left;  . BREAST REDUCTION SURGERY  1998  . [place]   hallo  . COLECTOMY  2005   right side  . EYE SURGERY Left 07/2014   cataract removed 3 months later  . HAND SURGERY Left   . [place]  2008  . KNEE ARTHROSCOPY Right 2002  . TONSILLECTOMY    . TUBAL LIGATION      FAMHx:  Family History  Problem Relation Age of Onset  . Atrial fibrillation Mother   . Breast cancer Maternal Aunt   . Lung cancer Maternal Uncle   . Stomach cancer Maternal Grandmother   . Lung cancer Maternal Aunt   . Brain cancer Maternal Aunt   . Prostate cancer Maternal Grandfather   . Stroke Paternal Grandmother   . Cancer Paternal Grandfather     SOCHx:   reports that she quit smoking about 6 years ago. Her smoking use included Cigarettes. She has a 35.00 pack-year smoking history. She has never used smokeless tobacco. She reports that she drinks about 0.6 oz of alcohol per week . She reports that she does not use drugs.  ALLERGIES:  Allergies  Allergen Reactions  . Ampicillin Hives    Severe reaction in February 2017 1.5 month to have hives to go away  . Codeine Nausea And Vomiting  . Morphine And Related Itching  . Penicillins Other (See Comments)    Has patient  had a PCN reaction causing immediate rash, facial/tongue/throat swelling, SOB or lightheadedness with hypotension: No Has patient had a PCN reaction causing severe rash involving mucus membranes or skin necrosis: No Has patient had a PCN reaction that required hospitalization No Has patient had a PCN reaction occurring within the last 10 years: No If all of the above answers are "NO", then may proceed with Cephalosporin use.     ROS: Pertinent items noted in HPI and remainder of  comprehensive ROS otherwise negative.  HOME MEDS: Current Outpatient Prescriptions on File Prior to Visit  Medication Sig Dispense Refill  . allopurinol (ZYLOPRIM) 100 MG tablet Take 2 tablets (200 mg total) by mouth daily. 180 tablet 0  . aluminum chloride (DRYSOL) 20 % external solution Apply topically at bedtime. 35 mL 0  . atorvastatin (LIPITOR) 20 MG tablet Take 1 tablet (20 mg total) by mouth daily. 90 tablet 1  . BIOTIN 5000 PO Take 5,000 capsules by mouth daily.     . fluticasone (FLOVENT HFA) 110 MCG/ACT inhaler Inhale 2 puffs into the lungs 2 (two) times daily. 3 Inhaler 1  . furosemide (LASIX) 20 MG tablet Take 20 mg by mouth daily as needed. Reported on 10/28/2015    . HYDROcodone-acetaminophen (NORCO) 5-325 MG tablet Take 1 tablet by mouth every 6 (six) hours as needed for moderate pain. 10 tablet 0  . KLOR-CON 10 10 MEQ tablet Take 10 mEq by mouth as needed. Reported on 10/28/2015    . levothyroxine (SYNTHROID, LEVOTHROID) 75 MCG tablet Take 1 tablet (75 mcg total) by mouth daily. 90 tablet 1  . MAGNESIUM CITRATE PO Take 2 tablets by mouth at bedtime. Reported on 09/29/2015    . Melatonin-Pyridoxine (MELATONIN-B6 PO) Take 1 tablet by mouth at bedtime.    . metoprolol succinate (TOPROL-XL) 25 MG 24 hr tablet Take 0.5 tablets (12.5 mg total) by mouth daily. 45 tablet 3  . primidone (MYSOLINE) 50 MG tablet Take 0.5 tablets (25 mg total) by mouth at bedtime. 45 tablet 1  . pyridOXINE (VITAMIN B-6) 100 MG tablet Take 100 mg by mouth daily.    Marland Kitchen rOPINIRole (REQUIP) 0.25 MG tablet Take 1 tablet (0.25 mg total) by mouth at bedtime. 90 tablet 1  . tiotropium (SPIRIVA) 18 MCG inhalation capsule Place 1 capsule (18 mcg total) into inhaler and inhale daily. 90 capsule 3  . traMADol (ULTRAM) 50 MG tablet Take 1 tablet (50 mg total) by mouth every 6 (six) hours as needed. 90 tablet 0  . valsartan-hydrochlorothiazide (DIOVAN-HCT) 320-12.5 MG tablet Take 1 tablet by mouth daily. 90 tablet 1  .  anastrozole (ARIMIDEX) 1 MG tablet Take 1 tablet (1 mg total) by mouth daily. (Patient not taking: Reported on 06/28/2016) 90 tablet 4   No current facility-administered medications on file prior to visit.     LABS/IMAGING: No results found for this or any previous visit (from the past 48 hour(s)). No results found.  WEIGHTS: Wt Readings from Last 3 Encounters:  07/05/16 240 lb (108.9 kg)  06/28/16 238 lb 12.8 oz (108.3 kg)  03/29/16 246 lb (111.6 kg)    VITALS: BP 108/72   Pulse 75   Ht 5\' 8"  (1.727 m)   Wt 240 lb (108.9 kg)   BMI 36.49 kg/m   EXAM: Deferred  EKG: Normal sinus rhythm at 75  ASSESSMENT: 1. Palpitations / tachycardia - monitor showed sinus tachy with PVC's (01/2016) 2. Chest discomfort- low risk Myoview and EF 65-70% by echo (01/2016) 3. Breast  CA 4. Hypertension  PLAN: 1.   Brandi Stephens had successful surgery and is not bothered by any more significant palpitations. I did encourage her to stay on low-dose Toprol. From a cardiac standpoint I do not feel that ongoing follow-up is necessary routinely. I will defer to her PCP to see if she would be comfortable continuing prescriptions for her Toprol. Otherwise I would need to see her back annually for those prescriptions.  Follow-up with me PRN.  [name], MD, [facility] Attending Cardiologist [place] 07/05/2016, 2:09 PM

## 2016-07-05 NOTE — Patient Instructions (Signed)
Your physician recommends that you continue on your current medications as directed. Please refer to the Current Medication list given to you today.  Come back as needed

## 2016-07-06 ENCOUNTER — Ambulatory Visit (INDEPENDENT_AMBULATORY_CARE_PROVIDER_SITE_OTHER): Payer: PPO | Admitting: Family Medicine

## 2016-07-06 ENCOUNTER — Encounter: Payer: Self-pay | Admitting: Family Medicine

## 2016-07-06 VITALS — BP 110/70 | HR 76 | Ht 67.5 in | Wt 242.4 lb

## 2016-07-06 DIAGNOSIS — E559 Vitamin D deficiency, unspecified: Secondary | ICD-10-CM | POA: Diagnosis not present

## 2016-07-06 DIAGNOSIS — Z1159 Encounter for screening for other viral diseases: Secondary | ICD-10-CM

## 2016-07-06 DIAGNOSIS — Z860101 Personal history of adenomatous and serrated colon polyps: Secondary | ICD-10-CM | POA: Insufficient documentation

## 2016-07-06 DIAGNOSIS — R208 Other disturbances of skin sensation: Secondary | ICD-10-CM

## 2016-07-06 DIAGNOSIS — Z853 Personal history of malignant neoplasm of breast: Secondary | ICD-10-CM | POA: Diagnosis not present

## 2016-07-06 DIAGNOSIS — Z86010 Personal history of colonic polyps: Secondary | ICD-10-CM | POA: Insufficient documentation

## 2016-07-06 DIAGNOSIS — G250 Essential tremor: Secondary | ICD-10-CM

## 2016-07-06 DIAGNOSIS — M8580 Other specified disorders of bone density and structure, unspecified site: Secondary | ICD-10-CM

## 2016-07-06 DIAGNOSIS — Z0000 Encounter for general adult medical examination without abnormal findings: Secondary | ICD-10-CM

## 2016-07-06 DIAGNOSIS — E782 Mixed hyperlipidemia: Secondary | ICD-10-CM | POA: Diagnosis not present

## 2016-07-06 DIAGNOSIS — Z8709 Personal history of other diseases of the respiratory system: Secondary | ICD-10-CM

## 2016-07-06 DIAGNOSIS — Z79899 Other long term (current) drug therapy: Secondary | ICD-10-CM

## 2016-07-06 DIAGNOSIS — R202 Paresthesia of skin: Secondary | ICD-10-CM | POA: Diagnosis not present

## 2016-07-06 DIAGNOSIS — R7303 Prediabetes: Secondary | ICD-10-CM

## 2016-07-06 DIAGNOSIS — M1000 Idiopathic gout, unspecified site: Secondary | ICD-10-CM

## 2016-07-06 DIAGNOSIS — I10 Essential (primary) hypertension: Secondary | ICD-10-CM

## 2016-07-06 DIAGNOSIS — Z8669 Personal history of other diseases of the nervous system and sense organs: Secondary | ICD-10-CM

## 2016-07-06 DIAGNOSIS — Z23 Encounter for immunization: Secondary | ICD-10-CM | POA: Diagnosis not present

## 2016-07-06 NOTE — Progress Notes (Signed)
Brandi Stephens is a 71 y.o. female who presents for annual wellness visit and follow-up on chronic medical conditions.  She has the following concerns:   Has not taken Lasix in 6 months.  Requip helps with RLS.  Numbness in bilateral feet L>R,  Sciatica on R  No longer taking potassium or Lasix.     Immunization History  Administered Date(s) Administered  . Influenza-Unspecified 12/17/2013  . Pneumococcal Conjugate-13 01/14/2014  . Pneumococcal Polysaccharide-23 07/06/2016  . Tdap 07/02/2013   Last Pap smear: partial hysterectomy in her 54s for heavy bleeding.  Last mammogram: 08/2015 Last colonoscopy: in past 5 years  Last DEXA: 2015 Dentist: 2 years Ophtho: in past 3-4 months Exercise: has not been able to exercise due to recent surgery.   Other doctors caring for patient include: Dr. Maryjean Ka for back and neck pain, Dr. Mardelle Matte for shoulder Dr. Debara Pickett cardiologist, Dr. Jana Hakim oncologist- has appointment next week.  Dr. Barry Dienes breast surgeon. [facility]- saw her last week (eczema), Dr. Elenor Legato- Neurosurgeon, Dr. Valetta Close- opthamologist,    Depression screen:  See questionnaire below.  Depression screen [facility] 2/9 07/06/2016 01/06/2016 06/08/2015  Decreased Interest 0 0 1  Down, Depressed, Hopeless 0 0 0  PHQ - 2 Score 0 0 1    Fall Risk Screen: see questionnaire below. Fall Risk  07/06/2016 01/06/2016 09/30/2015 06/08/2015  Falls in the past year? No No No No    ADL screen:  See questionnaire below Functional Status Survey: Is the patient deaf or have difficulty hearing?: No Does the patient have difficulty seeing, even when wearing glasses/contacts?: No Does the patient have difficulty concentrating, remembering, or making decisions?: No Does the patient have difficulty walking or climbing stairs?: Yes (sometimes walking or standing causes issues) Does the patient have difficulty dressing or bathing?: No Does the patient have difficulty doing  errands alone such as visiting a doctor's office or shopping?: No   End of Life Discussion:  Patient does not have a living will and medical power of attorney. Paperwork given and discussed.  Review of Systems Constitutional: -fever, -chills, -sweats, -unexpected weight change, -anorexia, -fatigue Allergy: -sneezing, -itching, -congestion Dermatology: denies changing moles, rash, lumps, new worrisome lesions ENT: -runny nose, -ear pain, -sore throat, -hoarseness, -sinus pain, -teeth pain, -tinnitus, -hearing loss, -epistaxis Cardiology:  -chest pain, -palpitations, -edema, -orthopnea, -paroxysmal nocturnal dyspnea Respiratory: -cough, -shortness of breath, -dyspnea on exertion, -wheezing, -hemoptysis Gastroenterology: -abdominal pain, -nausea, -vomiting, -diarrhea, -constipation, -blood in stool, -changes in bowel movement, -dysphagia Hematology: -bleeding or bruising problems Musculoskeletal: -arthralgias, -myalgias, -joint swelling, -back pain, -neck pain, -cramping, -gait changes Ophthalmology: -vision changes, -eye redness, -itching, -discharge Urology: -dysuria, -difficulty urinating, -hematuria, -urinary frequency, -urgency, incontinence Neurology: -headache, -weakness, -tingling, -numbness, -speech abnormality, -memory loss, -falls, -dizziness Psychology:  -depressed mood, -agitation, -sleep problems    PHYSICAL EXAM:  BP 110/70   Pulse 76   Ht 5' 7.5" (1.715 m)   Wt 242 lb 6.4 oz (110 kg)   BMI 37.40 kg/m   General Appearance: Alert, cooperative, no distress, appears stated age Head: Normocephalic, without obvious abnormality, atraumatic Eyes: PERRL, conjunctiva/corneas clear, EOM's intact, fundi benign Ears: Normal TM's and external ear canals Nose: Nares normal, mucosa normal, no drainage or sinus tenderness Throat: Lips, mucosa, and tongue normal; teeth and gums normal Neck: Supple, no lymphadenopathy; thyroid: no enlargement/tenderness/nodules; no carotid bruit or  JVD Back: Spine nontender, no curvature, ROM normal, no CVA tenderness Lungs: Clear to auscultation bilaterally without wheezes, rales or ronchi; respirations  unlabored Chest Wall: No tenderness or deformity Heart: Regular rate and rhythm, S1 and S2 normal, no murmur, rub or gallop Breast Exam: not done. This was done recently with breast cancer treatment. No axillary lymphadenopathy Abdomen: Soft, non-tender, nondistended, normoactive bowel sounds, no masses, no hepatosplenomegaly Genitalia: refused.  Extremities: No clubbing, cyanosis or edema. Pulses: 2+ and symmetric all extremities Skin: Skin color, texture, turgor normal, no rashes or lesions Lymph nodes: Cervical, supraclavicular, and axillary nodes normal Neurologic: CNII-XII intact, normal strength, sensation and gait; reflexes 2+ and symmetric throughout. Decreased sensation to bilateral feet, L>R.  Psych: Normal mood, affect, hygiene and grooming.  ASSESSMENT/PLAN: Medicare annual wellness visit, subsequent  Essential hypertension  Prediabetes - Plan: Hemoglobin A1c, Vitamin B12  Mixed hyperlipidemia - Plan: Lipid panel  Routine general medical examination at a health care facility - Plan: TSH, Basic metabolic panel  History of COPD - Plan: Spirometry with graph  Osteopenia determined by x-ray  Paresthesias - Plan: RPR, TSH, Vitamin B12, Ambulatory referral to Neurology  Vitamin D deficiency - Plan: VITAMIN D 25 Hydroxy (Vit-D Deficiency, Fractures)  Need for vaccination against Streptococcus pneumoniae - Plan: Pneumococcal polysaccharide vaccine 23-valent greater than or equal to 2yo subcutaneous/IM  Idiopathic gout, unspecified chronicity, unspecified site - Plan: Uric acid  Medication management - Plan: TSH, Uric acid  History of tremor - Plan: Ambulatory referral to Neurology  History of breast cancer - Plan: MM DIGITAL SCREENING BILATERAL  Decreased sensation of foot - Plan: Ambulatory referral to  Neurology  Need for hepatitis C screening test - Plan: Hepatitis C antibody  HTN- BP in goal range. No changes to medications.  Prediabetes- plan to check hemoglobin A1c.  Hyperlipidemia- she will return for fasting lipids. Will continue atorvastatin for now.  Gout- plan to check uric acid level. Continue on current dose of allopurinol. No recent flares.  History of COPD- per CT in 2017. spiriva and Qvar. Patient admits to poor compliance with medications but states she does not have any symptoms and questions this diagnosis. PFTs done in office show mild restriction. No changes to medications for now.  History of osteopenia per review of chart. There is an order for bone density in the chart.  Mammogram ordered.  Paresthesias and decreased sensation to bilateral feet- plan to refer to neurology for further evaluation. Suspect this may be related to back issues.  History of tremor and is taking daily primidone. This was initiated by neurology in the past per patient.  Vitamin D deficiency- she is taking a daily supplement. Plan to check vitamin D level.  Pneumovax 23 given.  One time Hep C test ordered per guidelines.  She will call her insurance about Shingrix and let us know if she would like to get this.  Follow up pending labs.   Discussed monthly self breast exams and yearly mammograms; at least 30 minutes of aerobic activity at least 5 days/week and weight-bearing exercise 2x/week; proper sunscreen use reviewed; healthy diet, including goals of calcium and vitamin D intake and alcohol recommendations (less than or equal to 1 drink/day) reviewed; regular seatbelt use; changing batteries in smoke detectors.  Immunization recommendations discussed.  Colonoscopy recommendations reviewed. It is unclear when she is due for her next colonoscopy. Will attempt to get records.    Medicare Attestation I have personally reviewed: The patient's medical and social history Their use of alcohol,  tobacco or illicit drugs Their current medications and supplements The patient's functional ability including ADLs,fall risks, home safety risks, cognitive,  and hearing and visual impairment Diet and physical activities Evidence for depression or mood disorders  The patient's weight, height, and BMI have been recorded in the chart.  I have made referrals, counseling, and provided education to the patient based on review of the above and I have provided the patient with a written personalized care plan for preventive services.     Harland Dingwall, NP-C   07/06/2016

## 2016-07-06 NOTE — Patient Instructions (Addendum)
MEDICARE PREVENTATIVE SERVICES (FEMALE) AND PERSONALIZED PLAN for [facility] July 06, 2016  CONDITIONS OR RISKS IDENTIFIED TODAY: Need for blood work including fasting lipids.  Due for shingles vaccine.  Decreased sensation to feet. Unclear when you are due for colonoscopy.   SPECIFIC RECOMMENDATIONS: Return for fasting lipids and other labs at your convenience sometime this week.  Call your insurance company and check on co-pay for the new shingles vaccine Shingrix.  Return the advance directives at your convenience.  I am referring you to a neurologist for further evaluation of numbness and decreased sensation in your feet.  Call and schedule your mammogram and bone density.  Please find out when your last colonoscopy was and when you are due.   Influenza vaccine: up to date Pneumococcal vaccine: 2nd pneumonia shot given today.  Shingles vaccine: she will check on co -pay for Shingrix.  Tdap vaccine: up to date Colonoscopy: in the past 5 years Mammogram: order in chart Pap smear: N/A  Aspirin 81mg  nightly for heart health  Return pending lab results.     GENERAL RECOMMENDATIONS FOR GOOD HEALTH:  Supplements:  . Take a daily baby Aspirin 81mg  at bedtime for heart health unless you have a history of gastrointestinal bleed, allergy to aspirin, or are already taking higher dose Aspirin or other antiplatelet or blood thinner medication.   . Consume 1200 mg of Calcium daily through dietary calcium or supplement if you are female age 24 or older, or men 44 and older.   Men aged 10-70 should consume 1000 mg of Calcium daily. . Take 600 IU of Vitamin D daily.  Take 800 IU of Calcium daily if you are older than age 90.  . Take a general multivitamin daily.   Healthy diet: Eat a variety of foods, including fruits, vegetables, vegetable protein such as beans, lentils, tofu, and grains, such as rice.  Limit meat or animal protein, but if you eat meat, choose leans cuts such as  chicken, fish, or [place].  Drink plenty of water daily.  Decrease saturated fat in the diet, avoid lots of red meat, processed foods, sweets, fast foods, and fried foods.  Limit salt and caffeine intake.  Exercise: Aerobic exercise helps maintain good heart health. Weight bearing exercise helps keep bones and muscles working strong.  We recommend at least 30-40 minutes of exercise most days of the week.   Fall prevention: Falls are the leading cause of injuries, accidents, and accidental deaths in people over the age of 98. Falling is a real threat to your ability to live on your own.  Causes include poor eyesight or poor hearing, illness, poor lighting, throw rugs, clutter in your home, and medication side effects causing dizziness or balance problems.  Such medications can include medications for depression, sleep problems, high blood pressure, diabetes, and heart conditions.   PREVENTION  Be sure your home is as safe as possible. Here are some tips:  Wear shoes with non-skid soles (not house slippers).   Be sure your home and outside area are well lit.   Use night lights throughout your house, including hallways and stairways.   Remove clutter and clean up spills on floors and walkways.   Remove throw rugs or fasten them to the floor with carpet tape. Tack down carpet edges.   Do not place electrical cords across pathways.   Install grab bars in your bathtub, shower, and toilet area. Towel bars should not be used as a grab bar.   Install  handrails on both sides of stairways.   Do not climb on stools or stepladders. Get someone else to help with jobs that require climbing.   Do not wax your floors at all, or use a non-skid wax.   Repair uneven or unsafe sidewalks, walkways or stairs.   Keep frequently used items within reach.   Be aware of pets so you do not trip.  Get regular check-ups from your doctor, and take good care of yourself:  Have your eyes checked every year for  vision changes, cataracts, glaucoma, and other eye problems. Wear eyeglasses as directed.   Have your hearing checked every 2 years, or anytime you or others think that you cannot hear well. Use hearing aids as directed.   See your caregiver if you have foot pain or corns. Sore feet can contribute to falls.   Let your caregiver know if a medicine is making you feel dizzy or making you lose your balance.   Use a cane, walker, or wheelchair as directed. Use walker or wheelchair brakes when getting in and out.   When you get up from bed, sit on the side of the bed for 1 to 2 minutes before you stand up. This will give your blood pressure time to adjust, and you will feel less dizzy.   If you need to go to the bathroom often, consider using a bedside commode.  Disease prevention:  If you smoke or chew tobacco, find out from your caregiver how to quit. It can literally save your life, no matter how long you have been a tobacco user. If you do not use tobacco, never begin. Medicare does cover some smoking cessation counseling.  Maintain a healthy diet and normal weight. Increased weight leads to problems with blood pressure and diabetes. We check your height, weight, and BMI as part of your yearly visit.  The Body Mass Index or BMI is a way of measuring how much of your body is fat. Having a BMI above 27 increases the risk of heart disease, diabetes, hypertension, stroke and other problems related to obesity. Your caregiver can help determine your BMI and based on it develop an exercise and dietary program to help you achieve or maintain this important measurement at a healthful level.  High blood pressure causes heart and blood vessel problems.  Persistent high blood pressure should be treated with medicine if weight loss and exercise do not work.  We check your blood pressure as part of your yearly visit.  Avoid drinking alcohol in excess (more than two drinks per day).  Avoid use of street  drugs. Do not share needles with anyone. Ask for professional help if you need assistance or instructions on stopping the use of alcohol, cigarettes, and/or drugs.  Brush your teeth twice a day with fluoride toothpaste, and floss once a day. Good oral hygiene prevents tooth decay and gum disease. The problems can be painful, unattractive, and can cause other health problems. Visit your dentist for a routine oral and dental checkup and preventive care every 6-12 months.   See your eye doctor yearly for routine screening for things like glaucoma.  Look at your skin regularly.  Use a mirror to look at your back. Notify your caregivers of changes in moles, especially if there are changes in shapes, colors, a size larger than a pencil eraser, an irregular border, or development of new moles.  Safety:  Use seatbelts 100% of the time, whether driving or as a passenger.  Use safety devices such as hearing protection if you work in environments with loud noise or significant background noise.  Use safety glasses when doing any work that could send debris in to the eyes.  Use a helmet if you ride a bike or motorcycle.  Use appropriate safety gear for contact sports.  Talk to your caregiver about gun safety.  Use sunscreen with a SPF (or skin protection factor) of 15 or greater.  Lighter skinned people are at a greater risk of skin cancer. Don't forget to also wear sunglasses in order to protect your eyes from too much damaging sunlight. Damaging sunlight can accelerate cataract formation.   If you have multiple sexual partners, or if you are not in a monogamous relationship, practice safe sex. Use condoms. Condoms are used to help reduce the spread of sexually transmitted infections (or STIs).  Consider an HIV test if you have never been tested.  Consider routine screening for STIs if you have multiple sexual partners.   Keep carbon monoxide and smoke detectors in your home functioning at all times. Change the  batteries every 6 months or use a model that plugs into the wall or is hard wired in.   END OF LIFE PLANNING/ADVANCED DIRECTIVES Advance health-care planning is deciding the kind of care you want at the end of life. While alert competent adults are able to exercise their rights to make health care and financial decisions, problems arise when an individual becomes unconscious, incapacitated, or otherwise unable to communicate or make such decisions. Advance health care directives are the legal documents in which you give written instructions about your choices limited, aggressive or palliative care if, in the future, you cannot speak for yourself.  Advanced directives include the following: [place] allows you to appoint someone to act as your health care agent to make health care decisions for you should it be determined by your health care provider that you are no longer able to make these decisions for yourself.  A Living Will is a legal document in which you can declare that under certain conditions you desire your life not be prolonged by extraordinary or artificial means during your last illness or when you are near death. We can provide you with sample advanced directives, you can get an attorney to prepare these for you, or you can visit [place] Secretary of State's website for additional information and resources at [email or web address]  Further, I recommend you have an attorney prepare a Will and Durable Power of Attorney if you haven't done so already.  Please get [place] a copy of your health care Advanced Directives.   PREVENTATIV E CARE RECOMMENDATIONS:  Vaccinations: We recommend the following vaccinations as part of your preventative care:  Pneumococcal vaccine is recommended to protect against certain types of pneumonia.  This is normally recommended for adults age 36 or older once, or up to every 5 years for those at high risk.  The vaccine is also  recommended for adults younger than 70 years old with certain underlying conditions that make them high risk for pneumonia.  Influenza vaccine is recommended to protect against seasonal influenza or "the flu." Influenza is a serious disease that can lead to hospitalization and sometimes even death. Traditional flu vaccines (called trivalent vaccines) are made to protect against three flu viruses; an influenza A (H1N1) virus, an influenza A (H3N2) virus, and an influenza B virus. In addition, there are flu vaccines made to protect against four flu  viruses (called "quadrivalent" vaccines). These vaccines protect against the same viruses as the trivalent vaccine and an additional B virus.  We recommend the high dose influenza vaccine to those 65 years and older.  Hepatitis B vaccine to protect against a form of infection of the liver by a virus acquired from blood or body fluids, particularly for high risk groups.  Td or Tdap vaccine to protect against Tetanus, diphtheria and pertussis which can be very serious.  These diseases are caused by bacteria.  Diphtheria and pertussis are spread from person to person through coughing or sneezing.  Tetanus enters the body through cuts, scratches, or wounds.  Tetanus (Lockjaw) causes painful muscle tightening and stiffness, usually all over the body.  Diphtheria can cause a thick coating to form in the back of the throat.  It can lead to breathing problems, paralysis, heart failure, and death.  Pertussis (Whooping Cough) causes severe coughing spells, which can cause difficulty breathing, vomiting and disturbed sleep.  Td or Tdap is usually given every 10 years.  Shingles vaccine to protect against Varicella Zoster if you are older than age 32, or younger than 70 years old with certain underlying illness.    Cancer Screening: Most routine colon cancer screening begins at the age of 8.  Subsequent colonoscopies are performed either every 5-10 years for normal  screening, or every 2-5 years for higher risks patients, up until age 24 years of age. Annual screening is done with easy to use take-home tests to check for hidden blood in the stool called hemoccult tests.  Sigmoidoscopy or colonoscopy can detect the earliest forms of colon cancer and is life saving. These tests use a small camera at the end of a tube to directly examine the colon.   Pelvic Exam and Pap Smear: Pelvic exams and pap smears are performed routinely to evaluate for abnormalities as well as cancers including cervical and vaginal cancers.  This is generally performed every 2-3 years for most women, or more frequently for higher risk patients.  Mammograms: Mammograms are used to screen for breast cancer.  Medicare covers baseline screening once from ages 31-62 years old, but will cover mammograms yearly for those 40 years and older.  In accordance with other guidelines, you may not need a mammogram every year though.  The decision on how frequently you need a mammogram should be discussed with you medical provider.    Osteoporosis Screening: Screening for osteoporosis usually begins at age 43 for women, and can be done as frequent as every 2 years.  However, women or men with higher risk of osteoporosis may be screened earlier than age 63.  Osteoporosis or low bone mass is diminished bone strength from alterations in bone architecture leading to bone fragility and increased fracture risk.     Cardiovascular Screening: Fat and cholesterol leaves deposits in your arteries that can block them. This causes heart disease and vessel disease elsewhere in your body.  If your cholesterol is found to be high, or if you have heart disease or certain other medical conditions, then you may need to have your cholesterol monitored frequently and be treated with medication. Cardiovascular screening in the form of lab tests for cholesterol, HDL and triglycerides can be done every 5 years.  A screening  electrocardiogram can be done as part of the Welcome to Medicare physical.  Diabetes Screening: Diabetes screening can be done at least every 3 years for those with risk factors,  or every 6-75months for prediabetic patients.  Screening includes fasting blood sugar test or glucose tolerance test.  Risk factors include hypertension, dyslipidemia, obesity, previously abnormal glucose tests, family history of diabetes, age 33 years or older, and history of gestations diabetes.   AAA (abdominal aortic aneurysm) Screening: Medicare allows for a one time ultrasound to screen for abdominal aortic aneurysm if done as a referral as part of the Welcome to Medicare exam.  Men eligible for this screening include those men between age 54-21 years of age who have smoked at least 100 cigarettes in his lifetime and/or has a family history of AAA.  HIV Screening:  Medicare allows for yearly screening for patients at high risk for contracting HIV disease.

## 2016-07-07 ENCOUNTER — Other Ambulatory Visit: Payer: Self-pay

## 2016-07-07 ENCOUNTER — Encounter: Payer: Self-pay | Admitting: Neurology

## 2016-07-07 ENCOUNTER — Other Ambulatory Visit: Payer: Self-pay | Admitting: Internal Medicine

## 2016-07-07 DIAGNOSIS — M75112 Incomplete rotator cuff tear or rupture of left shoulder, not specified as traumatic: Secondary | ICD-10-CM | POA: Diagnosis not present

## 2016-07-07 DIAGNOSIS — Z1211 Encounter for screening for malignant neoplasm of colon: Secondary | ICD-10-CM

## 2016-07-07 DIAGNOSIS — M25612 Stiffness of left shoulder, not elsewhere classified: Secondary | ICD-10-CM | POA: Diagnosis not present

## 2016-07-07 DIAGNOSIS — M25512 Pain in left shoulder: Secondary | ICD-10-CM | POA: Diagnosis not present

## 2016-07-08 ENCOUNTER — Other Ambulatory Visit: Payer: PPO

## 2016-07-08 ENCOUNTER — Encounter: Payer: Self-pay | Admitting: Internal Medicine

## 2016-07-08 ENCOUNTER — Telehealth: Payer: Self-pay | Admitting: Family Medicine

## 2016-07-08 DIAGNOSIS — Z0000 Encounter for general adult medical examination without abnormal findings: Secondary | ICD-10-CM

## 2016-07-08 DIAGNOSIS — Z1159 Encounter for screening for other viral diseases: Secondary | ICD-10-CM | POA: Diagnosis not present

## 2016-07-08 DIAGNOSIS — Z79899 Other long term (current) drug therapy: Secondary | ICD-10-CM

## 2016-07-08 DIAGNOSIS — E559 Vitamin D deficiency, unspecified: Secondary | ICD-10-CM | POA: Diagnosis not present

## 2016-07-08 DIAGNOSIS — R202 Paresthesia of skin: Secondary | ICD-10-CM | POA: Diagnosis not present

## 2016-07-08 DIAGNOSIS — E782 Mixed hyperlipidemia: Secondary | ICD-10-CM | POA: Diagnosis not present

## 2016-07-08 DIAGNOSIS — M1000 Idiopathic gout, unspecified site: Secondary | ICD-10-CM | POA: Diagnosis not present

## 2016-07-08 DIAGNOSIS — R7303 Prediabetes: Secondary | ICD-10-CM

## 2016-07-08 DIAGNOSIS — C50212 Malignant neoplasm of upper-inner quadrant of left female breast: Secondary | ICD-10-CM

## 2016-07-08 LAB — BASIC METABOLIC PANEL
BUN: 24 mg/dL (ref 7–25)
CO2: 25 mmol/L (ref 20–31)
CREATININE: 0.97 mg/dL (ref 0.50–0.99)
Calcium: 9.1 mg/dL (ref 8.6–10.4)
Chloride: 109 mmol/L (ref 98–110)
Glucose, Bld: 116 mg/dL — ABNORMAL HIGH (ref 65–99)
POTASSIUM: 4.7 mmol/L (ref 3.5–5.3)
Sodium: 140 mmol/L (ref 135–146)

## 2016-07-08 LAB — TSH: TSH: 4.19 mIU/L

## 2016-07-08 LAB — HEMOGLOBIN A1C
HEMOGLOBIN A1C: 5.5 % (ref <5.7)
MEAN PLASMA GLUCOSE: 111 mg/dL

## 2016-07-08 LAB — LIPID PANEL
CHOLESTEROL: 142 mg/dL (ref <200)
HDL: 34 mg/dL — ABNORMAL LOW (ref >50)
LDL Cholesterol: 56 mg/dL (ref <100)
Total CHOL/HDL Ratio: 4.2 Ratio (ref <5.0)
Triglycerides: 261 mg/dL — ABNORMAL HIGH (ref <150)
VLDL: 52 mg/dL — ABNORMAL HIGH (ref <30)

## 2016-07-08 NOTE — Telephone Encounter (Signed)
Pt came in and Advanced Directives were signed, witnessed and notarized. Sending back copy for review. Please return to [facility]

## 2016-07-09 LAB — URIC ACID: URIC ACID, SERUM: 5.6 mg/dL (ref 2.5–7.0)

## 2016-07-09 LAB — HEPATITIS C ANTIBODY: HCV Ab: NEGATIVE

## 2016-07-09 LAB — VITAMIN B12: Vitamin B-12: 322 pg/mL (ref 200–1100)

## 2016-07-09 LAB — VITAMIN D 25 HYDROXY (VIT D DEFICIENCY, FRACTURES): Vit D, 25-Hydroxy: 51 ng/mL (ref 30–100)

## 2016-07-09 LAB — RPR

## 2016-07-11 ENCOUNTER — Ambulatory Visit (HOSPITAL_BASED_OUTPATIENT_CLINIC_OR_DEPARTMENT_OTHER): Payer: PPO | Admitting: Oncology

## 2016-07-11 VITALS — BP 129/80 | HR 95 | Temp 98.2°F | Resp 18 | Ht 67.5 in | Wt 241.1 lb

## 2016-07-11 DIAGNOSIS — C50212 Malignant neoplasm of upper-inner quadrant of left female breast: Secondary | ICD-10-CM | POA: Diagnosis not present

## 2016-07-11 DIAGNOSIS — Z170 Estrogen receptor positive status [ER+]: Secondary | ICD-10-CM

## 2016-07-11 NOTE — Progress Notes (Signed)
[facility]  [phone] [phone]) [phone]     ID: Brandi Stephens DOB: [birth year]  MR#: [id number]  LTJ#:[id number]  Patient Care Team: Brandi Rm, NP-C as PCP - General (Family Medicine) Brandi Cruel, MD as Consulting Physician (Oncology) Brandi Klein, MD as Consulting Physician (General Surgery) Brandi Rudd, MD as Consulting Physician (Radiation Oncology) Brandi Lung, MD as Consulting Physician (Family Medicine) Brandi Lose, MD as Consulting Physician (Neurosurgery) Brandi Miner, MD as Consulting Physician (Dermatology) Brandi Phlegm, NP as Nurse Practitioner (Hematology and Oncology) Brandi Bond, MD as Consulting Physician (Orthopedic Surgery) PCP: Brandi Dingwall, NP-C GYN: OTHER MD:  CHIEF COMPLAINT: Estrogen receptor positive breast cancer  CURRENT TREATMENT: [Anastrozole]   BREAST CANCER HISTORY: From the original intake note:  Brandi Stephens started to feel some right breast pain and soreness in March 2017. She brought it to Dr. Lanice Shirts nurse practitioner, Brandi Stephens and since attention, and she was set up for bilateral diagnostic mammography with tomography at the [facility] 07/02/2015 area did the breast density was category C. The patient is status post bilateral reduction mammoplasties. Mammography showed no suspicious masses or calcifications in the right breast, and ultrasonography of the area where she has discomfort shows an area of distortion in the right breast at the 8:00 position 12 cm from the nipple measuring 1.4 cm thought to represent scarring related to the prior reduction surgery, but warranting further evaluation.  Also, in the left breast there was an area of distortion in the upper inner quadrant noted mammographically. Targeted ultrasound evaluating the left breast found no sonographic correlate to the mammographic findings. Ultrasound evaluation of both axillae were benign.  On 07/13/2015 Brandi Stephens  underwent biopsy of the right breast area in question which showed only fibrocystic changes with adenosis. Biopsy of the left breast upper inner quadrant however showed invasive ductal carcinoma, grade 1, estrogen receptor 90% positive, progesterone receptor 70% positive, WITH strong staining intensity, with an MIB-1 of 3%, and no HER-2 amplification, the signals ratio being 0.97 and the number per cell 1.65.  The patient's subsequent history is as detailed below  INTERVAL HISTORY: Brandi Stephens returns today for follow-up of her estrogen receptor positive breast cancer. She started anastrozole after the last visit here is yearly took it for 3 days. She had nausea, dizziness, vomiting, and a truncal rash. She stopped the medication and without steroid intervention but taking some Benadryl the rash and other problems subsided within 2 days. Needless to say she has not gone back on the medication and she is here today to discuss further options   REVIEW OF SYSTEMS: She she feels her breathing is better, and she has stopped some of her inhalers. She is still undergoing rehabilitation for her left rotator cuff surgery she is not otherwise exercising. A detailed review of systems today was stable  PAST MEDICAL HISTORY: Past Medical History:  Diagnosis Date  . Anxiety   . Arthritis   . Breast cancer ([facility])    left breast   . Breast cancer of lower-outer quadrant of right female breast ([facility]) 07/14/2015  . COPD (chronic obstructive pulmonary disease) ([facility])   . Hemorrhoids   . Hypertension   . Hypothyroidism   . PONV (postoperative nausea and vomiting)   . Prediabetes     PAST SURGICAL HISTORY: Past Surgical History:  Procedure Laterality Date  . ABDOMINAL HYSTERECTOMY  1985  . BASAL CELL CARCINOMA EXCISION  10/15   nose  . BREAST LUMPECTOMY WITH RADIOACTIVE SEED AND SENTINEL  LYMPH NODE BIOPSY Left 09/16/2015   Procedure: BREAST LUMPECTOMY WITH RADIOACTIVE SEED AND SENTINEL LYMPH NODE BIOPSY;   Surgeon: Brandi Klein, MD;  Location: [facility];  Service: General;  Laterality: Left;  . BREAST REDUCTION SURGERY  1998  . [place]   hallo  . COLECTOMY  2005   right side  . EYE SURGERY Left 07/2014   cataract removed 3 months later  . HAND SURGERY Left   . [place]  2008  . KNEE ARTHROSCOPY Right 2002  . TONSILLECTOMY    . TUBAL LIGATION      FAMILY HISTORY Family History  Problem Relation Age of Onset  . Atrial fibrillation Mother   . Breast cancer Maternal Aunt   . Stephens cancer Maternal Uncle   . Stomach cancer Maternal Grandmother   . Stephens cancer Maternal Aunt   . Brain cancer Maternal Aunt   . Prostate cancer Maternal Grandfather   . Stroke Paternal Grandmother   . Cancer Paternal Grandfather   The patient's father died at the age of 47 and her mother at the age of 16. The patient had no brothers, 2 sisters. The patient's mother was diagnosed with uterine cancer at the age of 67. The patient's sister was diagnosed with labial cancer at the age of 29. There is a maternal aunt with a history of breast cancer but the patient does not know at what age she was diagnosed. There is also on the mother's side history of Stephens cancer brain cancer and stomach cancer.   GYNECOLOGIC HISTORY:  No LMP recorded. Patient has had a hysterectomy.  Menarche age 69 first live birth age 76, the patient is [place] P3. She underwent hysterectomy without salpingo-oophorectomy at age 68. She took hormone replacement only for a few months.   SOCIAL HISTORY:   the patient is a retired Architect. Her husband on is also retired. He used to work for [employer]. Daughter Brandi Stephens lives in [place] and she is a Wellsite geologist, currently not employed. Daughter Brandi Stephens lives in [place] and has a Brewing technologist in social work but is not currently working. Son Brandi Stephens died at age 73 from causes not clear after autopsy. He had had significant neurologic damage  following an accident     ADVANCED DIRECTIVES:  not in place    HEALTH MAINTENANCE: Social History  Substance Use Topics  . Smoking status: Former Smoker    Packs/day: 1.00    Years: 35.00    Types: Cigarettes    Quit date: 03/15/2010  . Smokeless tobacco: Never Used  . Alcohol use 0.6 oz/week    1 Glasses of wine per week     Colonoscopy: 2015/[facility]   PAP:  Bone density: 2016  Lipid panel:  Allergies  Allergen Reactions  . Ampicillin Hives    Severe reaction in February 2017 1.5 month to have hives to go away  . Codeine Nausea And Vomiting  . Morphine And Related Itching  . Penicillins Other (See Comments)    Has patient had a PCN reaction causing immediate rash, facial/tongue/throat swelling, SOB or lightheadedness with hypotension: No Has patient had a PCN reaction causing severe rash involving mucus membranes or skin necrosis: No Has patient had a PCN reaction that required hospitalization No Has patient had a PCN reaction occurring within the last 10 years: No If all of the above answers are "NO", then may proceed with Cephalosporin use.     Current Outpatient Prescriptions  Medication  Sig Dispense Refill  . allopurinol (ZYLOPRIM) 100 MG tablet Take 2 tablets (200 mg total) by mouth daily. 180 tablet 0  . aluminum chloride (DRYSOL) 20 % external solution Apply topically at bedtime. 35 mL 0  . atorvastatin (LIPITOR) 20 MG tablet Take 1 tablet (20 mg total) by mouth daily. 90 tablet 1  . BIOTIN 5000 PO Take 5,000 capsules by mouth daily.     . furosemide (LASIX) 20 MG tablet Take 20 mg by mouth daily as needed. Reported on 10/28/2015    . KLOR-CON 10 10 MEQ tablet Take 10 mEq by mouth as needed. Reported on 10/28/2015    . levothyroxine (SYNTHROID, LEVOTHROID) 75 MCG tablet Take 1 tablet (75 mcg total) by mouth daily. 90 tablet 1  . MAGNESIUM CITRATE PO Take 2 tablets by mouth at bedtime. Reported on 09/29/2015    . Melatonin-Pyridoxine (MELATONIN-B6 PO) Take 1  tablet by mouth at bedtime.    . metoprolol succinate (TOPROL-XL) 25 MG 24 hr tablet Take 0.5 tablets (12.5 mg total) by mouth daily. 45 tablet 3  . primidone (MYSOLINE) 50 MG tablet Take 0.5 tablets (25 mg total) by mouth at bedtime. 45 tablet 1  . pyridOXINE (VITAMIN B-6) 100 MG tablet Take 100 mg by mouth daily.    Mar[place] Kitchen rOPINIRole (REQUIP) 0.25 MG tablet Take 1 tablet (0.25 mg total) by mouth at bedtime. 90 tablet 1  . traMADol (ULTRAM) 50 MG tablet Take 1 tablet (50 mg total) by mouth every 6 (six) hours as needed. 90 tablet 0  . valsartan-hydrochlorothiazide (DIOVAN-HCT) 320-12.5 MG tablet Take 1 tablet by mouth daily. 90 tablet 1   No current facility-administered medications for this visit.     OBJECTIVE: Middle-aged white woman  Vitals:   07/11/16 1124  BP: 129/80  Pulse: 95  Resp: 18  Temp: 98.2 F (36.8 C)     Body mass index is 37.2 kg/m.    ECOG FS:1 - Symptomatic but completely ambulatory  Sclerae unicteric, pupils round and equal Oropharynx clear and moist No cervical or supraclavicular adenopathy Lungs no rales or rhonchi Heart regular rate and rhythm Abd soft, obese,, decreased range of motion left upper extremity nontender, positive bowel sounds MSK no focal spinal tenderness, no upper extremity lymphedema Neuro: nonfocal, well oriented, appropriate affect Breasts: The right breast is benign. The left breast is undergone lumpectomy and radiation with no evidence of local recurrence. The left axilla is benign.  LAB RESULTS:  CMP     Component Value Date/Time   NA 140 07/08/2016 1042   NA 142 06/28/2016 1458   K 4.7 07/08/2016 1042   K 5.2 (H) 06/28/2016 1458   CL 109 07/08/2016 1042   CO2 25 07/08/2016 1042   CO2 24 06/28/2016 1458   GLUCOSE 116 (H) 07/08/2016 1042   GLUCOSE 114 06/28/2016 1458   BUN 24 07/08/2016 1042   BUN 23.6 06/28/2016 1458   CREATININE 0.97 07/08/2016 1042   CREATININE 1.0 06/28/2016 1458   CALCIUM 9.1 07/08/2016 1042   CALCIUM  10.3 06/28/2016 1458   PROT 6.8 06/28/2016 1458   ALBUMIN 3.6 06/28/2016 1458   AST 18 06/28/2016 1458   ALT 22 06/28/2016 1458   ALKPHOS 101 06/28/2016 1458   BILITOT 0.48 06/28/2016 1458   GFRNONAA >60 05/20/2015 0820   GFRAA >60 05/20/2015 0820    INo results found for: SPEP, UPEP  Lab Results  Component Value Date   WBC 11.4 (H) 06/28/2016   NEUTROABS 8.7 (H) 06/28/2016  HGB 15.4 06/28/2016   HCT 46.8 (H) 06/28/2016   MCV 97.6 06/28/2016   PLT 242 06/28/2016      Chemistry      Component Value Date/Time   NA 140 07/08/2016 1042   NA 142 06/28/2016 1458   K 4.7 07/08/2016 1042   K 5.2 (H) 06/28/2016 1458   CL 109 07/08/2016 1042   CO2 25 07/08/2016 1042   CO2 24 06/28/2016 1458   BUN 24 07/08/2016 1042   BUN 23.6 06/28/2016 1458   CREATININE 0.97 07/08/2016 1042   CREATININE 1.0 06/28/2016 1458      Component Value Date/Time   CALCIUM 9.1 07/08/2016 1042   CALCIUM 10.3 06/28/2016 1458   ALKPHOS 101 06/28/2016 1458   AST 18 06/28/2016 1458   ALT 22 06/28/2016 1458   BILITOT 0.48 06/28/2016 1458       No results found for: LABCA2  No components found for: LABCA125  No results for input(s): INR in the last 168 hours.  Urinalysis    Component Value Date/Time   COLORURINE YELLOW 05/15/2015 [place] 05/15/2015 1218   LABSPEC 1.012 05/15/2015 1218   PHURINE 5.0 05/15/2015 1218   GLUCOSEU NEGATIVE 05/15/2015 1218   HGBUR NEGATIVE 05/15/2015 1218   BILIRUBINUR n 11/25/2015 [place] 05/15/2015 1218   PROTEINUR n 11/25/2015 1712   PROTEINUR NEGATIVE 05/15/2015 1218   UROBILINOGEN negative 11/25/2015 1712   NITRITE n 11/25/2015 1712   NITRITE NEGATIVE 05/15/2015 1218   LEUKOCYTESUR small (1+) (A) 11/25/2015 1712      ELIGIBLE FOR AVAILABLE RESEARCH PROTOCOL: no  STUDIES: No results found.  ASSESSMENT: 71 y.o. [place] woman  Status post left breast upper inner quadrant biopsy 07/13/2015 for a clinical [place] N0  invasive ductal carcinoma, grade 1, estrogen and progesterone receptor positive, HER-2 nonamplified, with an MIB-1 of 3%.  (a) biopsy of a suspicious lesion in the right breast proved to be a small fibroadenoma  (1) left lumpectomy and sentinel lymph node sampling 09/16/2015 showed a pT1a pN0 invasive ductal carcinoma, grade 1, with negative margins, stage IA    (2) adjuvant radiation completed 11/18/2015  (3) To start anastrozole 02/16/2010.  PLAN: I spent approximately 30 minutes with Brandi Stephens with most of that time spent discussing her overall situation and concerns. We reviewed her prognosis, which is excellent even without anti-estrogens.  In fact the main reason for her to consider anti-estrogens is preventative. If she is able to take one of these medications for 5 years it will reduce her risk of breast cancer in the future I one half.  This is a great motivator but she certainly had side effects from her few days of anastrozole that if repeated would mean she cannot take that medication. In my experience however the rash and immediate nausea and dizziness are not common with this drug. I suspect there was a concurrence coincidental but unrelated problem going on.  The best way to figure this out is to return to the drug. She will take 1 tablet this week. If she tolerates it well she will take 2 tablets next week (Tuesday and Thursday), and if she tolerates that well she will go to Monday Wednesday Friday for the following at that point she can call us and let us know how it is going but if she is tolerating it well by then we can go to daily as planned.  Otherwise we will discontinue anastrozole and likely follow with observation alone.  I suggested she move her appointment with her surgeon to June. Accordingly she will return to see me again in September.  She knows to call for any problems that may develop before her next visit here.   Brandi Cruel, MD   07/11/2016 12:24  PM Medical Oncology and Hematology [facility] [address] [place], [place] [address] Tel. [phone]    Fax. [phone]   ADDENDUM: Doppler ultrasonography of the left lower extremity today showed no evidence of clot.

## 2016-07-11 NOTE — Progress Notes (Signed)
yours

## 2016-07-12 DIAGNOSIS — M48061 Spinal stenosis, lumbar region without neurogenic claudication: Secondary | ICD-10-CM | POA: Diagnosis not present

## 2016-07-14 DIAGNOSIS — M75112 Incomplete rotator cuff tear or rupture of left shoulder, not specified as traumatic: Secondary | ICD-10-CM | POA: Diagnosis not present

## 2016-07-14 DIAGNOSIS — M25512 Pain in left shoulder: Secondary | ICD-10-CM | POA: Diagnosis not present

## 2016-07-14 DIAGNOSIS — M25612 Stiffness of left shoulder, not elsewhere classified: Secondary | ICD-10-CM | POA: Diagnosis not present

## 2016-07-18 DIAGNOSIS — M75112 Incomplete rotator cuff tear or rupture of left shoulder, not specified as traumatic: Secondary | ICD-10-CM | POA: Diagnosis not present

## 2016-07-18 DIAGNOSIS — M25512 Pain in left shoulder: Secondary | ICD-10-CM | POA: Diagnosis not present

## 2016-07-19 ENCOUNTER — Telehealth: Payer: Self-pay | Admitting: Internal Medicine

## 2016-07-19 DIAGNOSIS — M25512 Pain in left shoulder: Secondary | ICD-10-CM | POA: Diagnosis not present

## 2016-07-19 DIAGNOSIS — M25612 Stiffness of left shoulder, not elsewhere classified: Secondary | ICD-10-CM | POA: Diagnosis not present

## 2016-07-19 DIAGNOSIS — M75112 Incomplete rotator cuff tear or rupture of left shoulder, not specified as traumatic: Secondary | ICD-10-CM | POA: Diagnosis not present

## 2016-07-19 NOTE — Telephone Encounter (Signed)
Pt is trying to schedule colonoscopy appt but Velora Heckler is wanting Colonscopy report and pathology report and it has not been sent to [place]. Pt will come by and sign to get this from her previous practice and then we will scan it in for leabuer to review it.

## 2016-07-21 DIAGNOSIS — M75112 Incomplete rotator cuff tear or rupture of left shoulder, not specified as traumatic: Secondary | ICD-10-CM | POA: Diagnosis not present

## 2016-07-21 DIAGNOSIS — M25612 Stiffness of left shoulder, not elsewhere classified: Secondary | ICD-10-CM | POA: Diagnosis not present

## 2016-07-21 DIAGNOSIS — M25512 Pain in left shoulder: Secondary | ICD-10-CM | POA: Diagnosis not present

## 2016-07-25 ENCOUNTER — Telehealth: Payer: Self-pay

## 2016-07-25 NOTE — Telephone Encounter (Signed)
Records placed in your folder for review. Brandi Stephens

## 2016-07-26 DIAGNOSIS — M25512 Pain in left shoulder: Secondary | ICD-10-CM | POA: Diagnosis not present

## 2016-07-26 DIAGNOSIS — M25612 Stiffness of left shoulder, not elsewhere classified: Secondary | ICD-10-CM | POA: Diagnosis not present

## 2016-07-26 DIAGNOSIS — M75112 Incomplete rotator cuff tear or rupture of left shoulder, not specified as traumatic: Secondary | ICD-10-CM | POA: Diagnosis not present

## 2016-07-28 DIAGNOSIS — M25512 Pain in left shoulder: Secondary | ICD-10-CM | POA: Diagnosis not present

## 2016-07-28 DIAGNOSIS — M75112 Incomplete rotator cuff tear or rupture of left shoulder, not specified as traumatic: Secondary | ICD-10-CM | POA: Diagnosis not present

## 2016-07-28 DIAGNOSIS — M25612 Stiffness of left shoulder, not elsewhere classified: Secondary | ICD-10-CM | POA: Diagnosis not present

## 2016-07-29 ENCOUNTER — Encounter: Payer: Self-pay | Admitting: Family Medicine

## 2016-08-01 ENCOUNTER — Encounter: Payer: Self-pay | Admitting: Family Medicine

## 2016-08-16 ENCOUNTER — Ambulatory Visit
Admission: RE | Admit: 2016-08-16 | Discharge: 2016-08-16 | Disposition: A | Discharge status: Home / Self Care | Payer: PPO | Source: Ambulatory Visit | Attending: Adult Health | Admitting: Adult Health

## 2016-08-16 ENCOUNTER — Telehealth: Payer: Self-pay | Admitting: Nurse Practitioner

## 2016-08-16 DIAGNOSIS — Z170 Estrogen receptor positive status [ER+]: Principal | ICD-10-CM

## 2016-08-16 DIAGNOSIS — M85851 Other specified disorders of bone density and structure, right thigh: Secondary | ICD-10-CM | POA: Diagnosis not present

## 2016-08-16 DIAGNOSIS — C50212 Malignant neoplasm of upper-inner quadrant of left female breast: Secondary | ICD-10-CM

## 2016-08-16 DIAGNOSIS — M8589 Other specified disorders of bone density and structure, multiple sites: Secondary | ICD-10-CM

## 2016-08-16 DIAGNOSIS — Z780 Asymptomatic menopausal state: Secondary | ICD-10-CM | POA: Diagnosis not present

## 2016-08-16 DIAGNOSIS — R928 Other abnormal and inconclusive findings on diagnostic imaging of breast: Secondary | ICD-10-CM | POA: Diagnosis not present

## 2016-08-16 HISTORY — DX: Personal history of irradiation: Z92.3

## 2016-08-16 NOTE — Telephone Encounter (Addendum)
Called patient to give results below per NP. She reports taking daily calcium and vitamin D supplement. She is recovering from shoulder rotator cuff surgery and is using weights with PT. She will increase walking for weight bearing exercise as tolerated.   She tapered up her dose of Anastrazole per Dr. Virgie Dad instruction at last visit and is up to full dosing per Rx without side effects.   She is informed to call clinic with any questions or concerns, verbalizes understanding of the above, and thanks for the call.   ----- Message from Gardenia Phlegm, NP sent at 08/16/2016  3:21 PM EDT ----- Please let patient know she has osteopenia.  What has she decided in terms of Anastrozole?  Is she going to continue it.  For now, she should do calcium, vitamin d and weight bearing exercises to help her bones, until appt with [facility] in September.

## 2016-08-31 DIAGNOSIS — M75112 Incomplete rotator cuff tear or rupture of left shoulder, not specified as traumatic: Secondary | ICD-10-CM | POA: Diagnosis not present

## 2016-09-09 ENCOUNTER — Encounter: Payer: Self-pay | Admitting: Neurology

## 2016-09-09 ENCOUNTER — Ambulatory Visit (INDEPENDENT_AMBULATORY_CARE_PROVIDER_SITE_OTHER): Payer: PPO | Admitting: Neurology

## 2016-09-09 VITALS — BP 130/74 | HR 94 | Ht 67.5 in | Wt 243.4 lb

## 2016-09-09 DIAGNOSIS — R202 Paresthesia of skin: Secondary | ICD-10-CM

## 2016-09-09 DIAGNOSIS — G250 Essential tremor: Secondary | ICD-10-CM | POA: Diagnosis not present

## 2016-09-09 DIAGNOSIS — G2581 Restless legs syndrome: Secondary | ICD-10-CM | POA: Diagnosis not present

## 2016-09-09 MED ORDER — ROPINIROLE HCL 0.5 MG PO TABS: 0.5000 mg | ORAL_TABLET | Freq: Every day | ORAL | 3 refills | Status: DC

## 2016-09-09 MED ORDER — PRIMIDONE 50 MG PO TABS: 50.0000 mg | ORAL_TABLET | Freq: Every day | ORAL | 3 refills | Status: DC

## 2016-09-09 NOTE — Progress Notes (Signed)
[facility] Clinic Note - Initial Visit   Date: 09/09/16  Brandi Stephens MRN: [id number] DOB: Aug 04, 1945   Dear Harland Dingwall, NP:  Thank you for your kind referral of Brandi Stephens for consultation of bilateral feet paresthesias. Although Brandi Stephens history is well known to you, please allow [place] to reiterate it for the purpose of our medical record. The patient was accompanied to the clinic by daughter who also provides collateral information.     History of Present Illness: Brandi Stephens is a 71 y.o. right-handed Caucasian female with hypertension, COPD, left breast cancer (2017), hypothyroidism, essential tremor, lumbar spondylosis s/p L4-S1 PLIF (03/2015) and chronic low back pain  presenting for evaluation of bilateral feet paresthesias.    In December 2016 Brandi Stephens had low back surgery and since this time, Brandi Stephens had numbness involving the toes and balls of the feet which is worse in the left.  Brandi Stephens also complains of burning and tingling pain in the feet which wakes Brandi Stephens up from sleeping and is less apparent during the day. Brandi Stephens denies any weakness of the feet, imbalance, and walks unassisted.  Brandi Stephens does not wish to be on any medication for Brandi Stephens painful paresthesias.  Brandi Stephens had no history of diabetes, alcoholism, or chemotherapy. Brandi Stephens recent TSH, vitamin B12, and HbA1c was normal.  For Brandi Stephens low back pain, Brandi Stephens sees Dr. Maryjean Ka for Brandi Stephens low back pain and has ESI which did not help. Brandi Stephens was tried on gabapentin but did not appreciate any benefit and developed swelling.     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  Brandi Stephens takes requip 0.25mg  for RLS and primidone 25mg  at bedtime for tremors which are not controlling Brandi Stephens symptoms well.  Brandi Stephens mother also has history of essential tremor.    Out-side paper records, electronic medical record, and images have been reviewed where available  and summarized as:  MRI lumbar spine 11/22/2014: The dominant RIGHT-sided abnormality is at L5-S1 where an extruded disc fragment extends cephalad on the RIGHT. Compounding this is central disc osteophyte complex, severe disc space narrowing, and posterior element hypertrophy with resultant BILATERAL L5 nerve root impingement, worse on the RIGHT. Central protrusion L4-5 with posterior element hypertrophy and moderate canal stenosis. BILATERAL L5 nerve root impingement is observed, RIGHT greater than LEFT, exacerbated by RIGHT-sided synovial cyst, along with probable RIGHT L4 nerve root compression in the foramen. Central protrusion at L3-4 not clearly asymmetric to the RIGHT. Foraminal disc material on the LEFT at L2-3 and L3-4 also observed. 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 Increased body habitus. Increased epidural fat is observed opposite the L4 and L5 levels.  Lab Results  Component Value Date   TSH 4.19 07/08/2016   Lab Results  Component Value Date   HGBA1C 5.5 07/08/2016   Lab Results  Component Value Date   BHALPFXT02 409 07/08/2016   Lab Results  Component Value Date   FOLATE 16.7 05/18/2015    Past Medical History:  Diagnosis Date  . Anxiety   . Arthritis   . Breast cancer ([place])    left breast   . Breast cancer of lower-outer quadrant of right female breast ([place]) 07/14/2015  . COPD (chronic obstructive pulmonary disease) ([place])   . Hemorrhoids   . Hypertension   . Hypothyroidism   . Personal history of radiation therapy   . PONV (postoperative nausea and  vomiting)   . Prediabetes     Past Surgical History:  Procedure Laterality Date  . ABDOMINAL HYSTERECTOMY  1985  . BASAL CELL CARCINOMA EXCISION  10/15   nose  . BREAST BIOPSY Right 08/24/2015  . BREAST BIOPSY Right 07/13/2015  . BREAST BIOPSY Left 07/13/2015  . BREAST EXCISIONAL BIOPSY Left 1972  . BREAST LUMPECTOMY Left 09/16/2015  . BREAST LUMPECTOMY WITH RADIOACTIVE SEED AND SENTINEL  LYMPH NODE BIOPSY Left 09/16/2015   Procedure: BREAST LUMPECTOMY WITH RADIOACTIVE SEED AND SENTINEL LYMPH NODE BIOPSY;  Surgeon: Stark Klein, MD;  Location: [facility];  Service: General;  Laterality: Left;  . BREAST REDUCTION SURGERY  1998  . [place]   hallo  . COLECTOMY  2005   right side  . EYE SURGERY Left 07/2014   cataract removed 3 months later  . HAND SURGERY Left   . [place]  2008  . KNEE ARTHROSCOPY Right 2002  . REDUCTION MAMMAPLASTY Bilateral 1996  . TONSILLECTOMY    . TUBAL LIGATION       Medications:  Outpatient Encounter Prescriptions as of 09/09/2016  Medication Sig Note  . allopurinol (ZYLOPRIM) 100 MG tablet Take 2 tablets (200 mg total) by mouth daily.   Marland Kitchen aluminum chloride (DRYSOL) 20 % external solution Apply topically at bedtime.   Marland Kitchen anastrozole (ARIMIDEX) 1 MG tablet Take 1 mg by mouth daily.   Marland Kitchen atorvastatin (LIPITOR) 20 MG tablet Take 1 tablet (20 mg total) by mouth daily.   . baclofen (LIORESAL) 10 MG tablet Take 10 mg by mouth 3 (three) times daily.   Marland Kitchen BIOTIN 5000 PO Take 5,000 capsules by mouth daily.    . Calcium Carbonate-Vitamin D (CALCIUM 500/D PO) Take by mouth.   . Cholecalciferol (D3 VITAMIN PO) Take by mouth.   . furosemide (LASIX) 20 MG tablet Take 20 mg by mouth daily as needed. Reported on 10/28/2015 02/03/2016: Takes as needed  . KLOR-CON 10 10 MEQ tablet Take 10 mEq by mouth as needed. Reported on 10/28/2015   . levothyroxine (SYNTHROID, LEVOTHROID) 75 MCG tablet Take 1 tablet (75 mcg total) by mouth daily.   Marland Kitchen MAGNESIUM CITRATE PO Take 2 tablets by mouth at bedtime. Reported on 09/29/2015   . Melatonin-Pyridoxine (MELATONIN-B6 PO) Take 1 tablet by mouth at bedtime.   . metoprolol succinate (TOPROL-XL) 25 MG 24 hr tablet Take 0.5 tablets (12.5 mg total) by mouth daily.   . primidone (MYSOLINE) 50 MG tablet Take 1 tablet (50 mg total) by mouth at bedtime.   . pyridOXINE (VITAMIN B-6) 100 MG tablet  Take 100 mg by mouth daily.   Marland Kitchen rOPINIRole (REQUIP) 0.5 MG tablet Take 1 tablet (0.5 mg total) by mouth at bedtime.   . traMADol (ULTRAM) 50 MG tablet Take 1 tablet (50 mg total) by mouth every 6 (six) hours as needed.   . valsartan-hydrochlorothiazide (DIOVAN-HCT) 320-12.5 MG tablet Take 1 tablet by mouth daily.   . [DISCONTINUED] primidone (MYSOLINE) 50 MG tablet Take 0.5 tablets (25 mg total) by mouth at bedtime.   . [DISCONTINUED] rOPINIRole (REQUIP) 0.25 MG tablet Take 1 tablet (0.25 mg total) by mouth at bedtime.    No facility-administered encounter medications on file as of 09/09/2016.      Allergies:  Allergies  Allergen Reactions  . Ampicillin Hives    Severe reaction in February 2017 1.5 month to have hives to go away  . Codeine Nausea And Vomiting  . Morphine And Related Itching  .  Penicillins Other (See Comments)    Has patient had a PCN reaction causing immediate rash, facial/tongue/throat swelling, SOB or lightheadedness with hypotension: No Has patient had a PCN reaction causing severe rash involving mucus membranes or skin necrosis: No Has patient had a PCN reaction that required hospitalization No Has patient had a PCN reaction occurring within the last 10 years: No If all of the above answers are "NO", then may proceed with Cephalosporin use.     Family History: Family History  Problem Relation Age of Onset  . Atrial fibrillation Mother   . Breast cancer Maternal Aunt   . Lung cancer Maternal Uncle   . Stomach cancer Maternal Grandmother   . Lung cancer Maternal Aunt   . Brain cancer Maternal Aunt   . Prostate cancer Maternal Grandfather   . Stroke Paternal Grandmother   . Cancer Paternal Grandfather     Social History: Social History  Substance Use Topics  . Smoking status: Current Some Day Smoker    Packs/day: 1.00    Years: 35.00    Types: Cigarettes    Last attempt to quit: 03/15/2010  . Smokeless tobacco: Never Used  . Alcohol use 0.6 oz/week      1 Glasses of wine per week     Comment: 1 drink nightly   Social History   Social History Narrative   Lives with husband in a 2 story home.  Has 2 living children.  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  Education: college.     Review of Systems:  CONSTITUTIONAL: No fevers, chills, night sweats, or weight loss.   EYES: No visual changes or eye pain ENT: No hearing changes.  No history of nose bleeds.   RESPIRATORY: +cough, wheezing and shortness of breath.   CARDIOVASCULAR: Negative for chest pain, and palpitations.   GI: Negative for abdominal discomfort, blood in stools or black stools.  No recent change in bowel habits.   GU:  No history of incontinence.   MUSCLOSKELETAL: +history of joint pain or swelling.  No myalgias.   SKIN: Negative for lesions, rash, and itching.   HEMATOLOGY/ONCOLOGY: Negative for prolonged bleeding, bruising easily, and swollen nodes.  +history of cancer.   ENDOCRINE: Negative for cold or heat intolerance, polydipsia or goiter.   PSYCH:  No depression or anxiety symptoms.   NEURO: As Above.   Vital Signs:  BP 130/74   Pulse 94   Ht 5' 7.5" (1.715 m)   Wt 243 lb 7 oz (110.4 kg)   SpO2 95%   BMI 37.56 kg/m    General Medical Exam:   General:  Well appearing, comfortable.   Eyes/ENT: see cranial nerve examination.   Neck: No masses appreciated.  Full range of motion without tenderness.  No carotid bruits. Respiratory:  Clear to auscultation, good air entry bilaterally.   Cardiac:  Regular rate and rhythm, no murmur.   Extremities:  No deformities, edema, or skin discoloration.  Skin:  No rashes or lesions.  Neurological Exam: MENTAL STATUS including orientation to time, place, person, recent and remote memory, attention span and concentration, language, and fund of knowledge is normal.  Speech is not dysarthric.  CRANIAL NERVES: II:  No visual field defects.  Unremarkable fundi.   III-IV-VI: Pupils equal round and reactive to  light.  Normal conjugate, extra-ocular eye movements in all directions of gaze.  No nystagmus.  No ptosis.   V:  Normal facial sensation.   VII:  Normal facial symmetry and  movements.   VIII:  Normal hearing and vestibular function.   IX-X:  Normal palatal movement.   XI:  Normal shoulder shrug and head rotation.   XII:  Normal tongue strength and range of motion, no deviation or fasciculation.  MOTOR:  No atrophy, fasciculations or abnormal movements.  No pronator drift.  Tone is normal.    Right Upper Extremity:    Left Upper Extremity:    Deltoid  5/5   Deltoid  5/5   Biceps  5/5   Biceps  5/5   Triceps  5/5   Triceps  5/5   Wrist extensors  5/5   Wrist extensors  5/5   Wrist flexors  5/5   Wrist flexors  5/5   Finger extensors  5/5   Finger extensors  5/5   Finger flexors  5/5   Finger flexors  5/5   Dorsal interossei  5/5   Dorsal interossei  5/5   Abductor pollicis  5/5   Abductor pollicis  5/5   Tone (Ashworth scale)  0  Tone (Ashworth scale)  0   Right Lower Extremity:    Left Lower Extremity:    Hip flexors  5/5   Hip flexors  5/5   Hip extensors  5/5   Hip extensors  5/5   Knee flexors  5/5   Knee flexors  5/5   Knee extensors  5/5   Knee extensors  5/5   Dorsiflexors  5/5   Dorsiflexors  5-/5   Plantarflexors  5/5   Plantarflexors  5/5   Toe extensors  5/5   Toe extensors  5-/5   Toe flexors  5/5   Toe flexors  5-/5   Tone (Ashworth scale)  0  Tone (Ashworth scale)  0   MSRs:  Right                                                                 Left brachioradialis 2+  brachioradialis 2+  biceps 2+  biceps 2+  triceps 2+  triceps 2+  patellar 2+  patellar 2+  ankle jerk 0  ankle jerk 0  Hoffman no  Hoffman no  plantar response down  plantar response down   SENSORY:  Temperature is reduced distal to mid-calf, vibration is trace at the great toe.  Pin prick and light touch is intact throughout.   Romberg's sign absent.   COORDINATION/GAIT: Normal finger-to-  nose-finger.  Intact rapid alternating movements bilaterally.  Able to rise from a chair without using arms.  Gait narrow based and stable. Brandi Stephens is unsteady with tandem gait but able to perform.  Toe walking intact, Brandi Stephens is unable to perform heel walking due to low back pain.  IMPRESSION: 1.  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  We can check additional labs looking for copper deficiency, paraproteinemias, and screen for autoimmune disease, but Brandi Stephens declined additional  testing  - Alternatively, if this was all stemming from Brandi Stephens back, management would be symptomatic with medications such as Lyrica or nortriptyline, however, Brandi Stephens does not wish to be on any pharmacological therapy for Brandi Stephens symptoms.  - I offered OTC lidocaine ointment which Brandi Stephens can use as needed for severe burning/tingling, which Brandi Stephens is agreeable to trying  2.  Benign essential tremor  - increase primidone to 50mg  at bedtime   3.  Restless leg syndrome  - Increase ropinirole to 0.5mg  at bedtime  4.  Lumbar spondylosis s/p L4-S1 PLIF (03/2015) by Dr. Kathyrn Sheriff, Brandi Stephens is receiving ESI by Dr. Maryjean Ka  Return to clinic in 6 months.   The duration of this appointment visit was 50 minutes of face-to-face time with the patient.  Greater than 50% of this time was spent in counseling, explanation of diagnosis, planning of further management, and coordination of care.   Thank you for allowing me to participate in patient's care.  If I can answer any additional questions, I would be pleased to do so.    Sincerely,    Donika K. Posey Pronto, DO

## 2016-09-09 NOTE — Patient Instructions (Addendum)
1.  Increase requip to 0.5mg  3 hours before bedtime 2.  Increase primidone 50mg  at bedtime 3.  You can try lidocaine ointment such as Salonpas or Aspercream to your feet 4.  Encouraged to stop smoking  Return to clinic in 6 months

## 2016-09-14 ENCOUNTER — Ambulatory Visit
Admission: RE | Admit: 2016-09-14 | Discharge: 2016-09-14 | Disposition: A | Discharge status: Home / Self Care | Payer: PPO | Source: Ambulatory Visit | Attending: Family Medicine | Admitting: Family Medicine

## 2016-09-14 ENCOUNTER — Encounter: Payer: Self-pay | Admitting: Family Medicine

## 2016-09-14 ENCOUNTER — Ambulatory Visit (INDEPENDENT_AMBULATORY_CARE_PROVIDER_SITE_OTHER): Payer: PPO | Admitting: Family Medicine

## 2016-09-14 VITALS — BP 106/64 | HR 81 | Temp 98.1°F | Resp 16 | Wt 240.0 lb

## 2016-09-14 DIAGNOSIS — R058 Other specified cough: Secondary | ICD-10-CM

## 2016-09-14 DIAGNOSIS — R05 Cough: Secondary | ICD-10-CM

## 2016-09-14 DIAGNOSIS — I700 Atherosclerosis of aorta: Secondary | ICD-10-CM | POA: Insufficient documentation

## 2016-09-14 MED ORDER — BENZONATATE 200 MG PO CAPS: 200.0000 mg | ORAL_CAPSULE | Freq: Two times a day (BID) | ORAL | 0 refills | Status: DC | PRN

## 2016-09-14 MED ORDER — AZITHROMYCIN 250 MG PO TABS: ORAL_TABLET | ORAL | 0 refills | Status: DC

## 2016-09-14 NOTE — Patient Instructions (Signed)
Take the antibiotic and try the Tessalon for cough. If you are not improving or if you get worse call me. Stay hydrated.

## 2016-09-14 NOTE — Progress Notes (Signed)
Subjective: Chief Complaint  Patient presents with  . cough    deep coughing for 2 months. started out as allergies, mucous,      Brandi Stephens is a 71 y.o. female who presents for a 2 month history of cough that seems to be getting worse. She also reports post nasal drainage, occasional rhinorrhea, nasal congestion and hoarseness. Spitting up sputum that is dark beige. History of pneumonia. She is concerned that her cough may be turning into pneumonia.  Denies fever, chills, ear pain, sinus pain, sore throat, dizziness, chest pain, palpitations, shortness of breath, orthopnea, abdominal pain, N/V/D, no LE edema.  Cough is worse with talking and trying to sleep.   Taking allegra daily for allergies.   Treatment to date: antihistamines and cough suppressants.  Denies sick contacts.  No other aggravating or relieving factors.  No other c/o.  ROS as in subjective.   Objective: Vitals:   09/14/16 1129 09/14/16 1200  BP: 106/64   Pulse: 81   Resp: 16 16  Temp: 98.1 F (36.7 C)     General appearance: Alert, WD/WN, no distress, mildly ill appearing                             Skin: warm, no rash                           Head: no sinus tenderness                            Eyes: conjunctiva normal, corneas clear, PERRLA                            Ears: pearly TMs, external ear canals normal                          Nose: septum midline, turbinates swollen, with erythema and clear discharge             Mouth/throat: MMM, tongue normal, mild pharyngeal erythema                           Neck: supple, no adenopathy, no thyromegaly, nontender                          Heart: RRR, normal S1, S2, no murmurs                         Lungs: CTA bilaterally, no wheezes, rales, or rhonchi      Assessment: Cough present for greater than 3 weeks - Plan: DG Chest 2 View   Plan: Cough for 2 months that is now productive- plan to send her for a chest XR and start her on a Z-pack. Her oxygen  saturation did not drop with ambulation. Tessalon sent to pharmacy for cough. She is not in any distress. Advised to hydrate and let me know if she is not improving or if she worsens.  Recommend continuing on Allegra for allergies and adding Flonase. May consider Singulair if allergy symptoms are not improving. Will follow up pending XR result.

## 2016-09-22 ENCOUNTER — Telehealth: Payer: Self-pay | Admitting: Family Medicine

## 2016-09-22 MED ORDER — ALLOPURINOL 100 MG PO TABS: 200.0000 mg | ORAL_TABLET | Freq: Every day | ORAL | 1 refills | Status: DC

## 2016-09-22 NOTE — Telephone Encounter (Signed)
Done

## 2016-09-22 NOTE — Telephone Encounter (Signed)
Recv'd fax from [facility] need refill Allopurinol 100 [phone] 2 po qd

## 2016-09-22 NOTE — Telephone Encounter (Signed)
Ok to refill 

## 2016-10-05 ENCOUNTER — Other Ambulatory Visit: Payer: Self-pay | Admitting: Family Medicine

## 2016-10-05 NOTE — Telephone Encounter (Signed)
Is this okay?

## 2016-10-05 NOTE — Telephone Encounter (Signed)
ok 

## 2016-10-14 DIAGNOSIS — M9905 Segmental and somatic dysfunction of pelvic region: Secondary | ICD-10-CM | POA: Diagnosis not present

## 2016-10-14 DIAGNOSIS — M9903 Segmental and somatic dysfunction of lumbar region: Secondary | ICD-10-CM | POA: Diagnosis not present

## 2016-10-14 DIAGNOSIS — M9901 Segmental and somatic dysfunction of cervical region: Secondary | ICD-10-CM | POA: Diagnosis not present

## 2016-10-14 DIAGNOSIS — M9902 Segmental and somatic dysfunction of thoracic region: Secondary | ICD-10-CM | POA: Diagnosis not present

## 2016-10-14 DIAGNOSIS — M545 Low back pain: Secondary | ICD-10-CM | POA: Diagnosis not present

## 2016-10-14 DIAGNOSIS — M546 Pain in thoracic spine: Secondary | ICD-10-CM | POA: Diagnosis not present

## 2016-10-14 DIAGNOSIS — M5432 Sciatica, left side: Secondary | ICD-10-CM | POA: Diagnosis not present

## 2016-10-14 DIAGNOSIS — M5412 Radiculopathy, cervical region: Secondary | ICD-10-CM | POA: Diagnosis not present

## 2016-10-24 ENCOUNTER — Other Ambulatory Visit: Payer: Self-pay | Admitting: General Surgery

## 2016-10-24 DIAGNOSIS — N6489 Other specified disorders of breast: Secondary | ICD-10-CM | POA: Diagnosis not present

## 2016-10-24 DIAGNOSIS — Z170 Estrogen receptor positive status [ER+]: Secondary | ICD-10-CM | POA: Diagnosis not present

## 2016-10-24 DIAGNOSIS — C50212 Malignant neoplasm of upper-inner quadrant of left female breast: Secondary | ICD-10-CM | POA: Diagnosis not present

## 2016-11-04 ENCOUNTER — Other Ambulatory Visit: Payer: Self-pay | Admitting: Family Medicine

## 2016-11-04 NOTE — Telephone Encounter (Signed)
Is this okay to refill? 

## 2016-11-04 NOTE — Telephone Encounter (Signed)
Pt states that she takes this for her chronic pain from her back. She does not see her neurosurgereon at the moment. Will call this in

## 2016-11-04 NOTE — Telephone Encounter (Signed)
Please check but I think ortho has been taking care of her pain recently. I prefer they refill but don't let her run out. If she cannot get them to refill it then I will give her a 90 day supply.

## 2016-11-16 DIAGNOSIS — L720 Epidermal cyst: Secondary | ICD-10-CM | POA: Diagnosis not present

## 2016-11-16 DIAGNOSIS — L821 Other seborrheic keratosis: Secondary | ICD-10-CM | POA: Diagnosis not present

## 2016-11-16 DIAGNOSIS — Z85828 Personal history of other malignant neoplasm of skin: Secondary | ICD-10-CM | POA: Diagnosis not present

## 2016-11-28 ENCOUNTER — Encounter: Payer: Self-pay | Admitting: Family Medicine

## 2016-11-30 ENCOUNTER — Other Ambulatory Visit: Payer: Self-pay | Admitting: Family Medicine

## 2016-11-30 NOTE — Telephone Encounter (Signed)
Pt cpe was in march 2018

## 2016-12-12 ENCOUNTER — Other Ambulatory Visit: Payer: Self-pay | Admitting: Family Medicine

## 2016-12-12 MED ORDER — ATORVASTATIN CALCIUM 20 MG PO TABS: 20.0000 mg | ORAL_TABLET | Freq: Every day | ORAL | 0 refills | Status: DC

## 2016-12-12 NOTE — Addendum Note (Signed)
Addended by: Minette Headland A on: 12/12/2016 04:53 PM   Modules accepted: Orders

## 2017-01-04 ENCOUNTER — Telehealth: Payer: Self-pay | Admitting: Oncology

## 2017-01-04 ENCOUNTER — Ambulatory Visit (HOSPITAL_BASED_OUTPATIENT_CLINIC_OR_DEPARTMENT_OTHER): Payer: PPO | Admitting: Oncology

## 2017-01-04 VITALS — BP 121/81 | HR 107 | Temp 98.2°F | Resp 19 | Ht 67.5 in | Wt 247.2 lb

## 2017-01-04 DIAGNOSIS — Z170 Estrogen receptor positive status [ER+]: Secondary | ICD-10-CM

## 2017-01-04 DIAGNOSIS — C50212 Malignant neoplasm of upper-inner quadrant of left female breast: Secondary | ICD-10-CM | POA: Diagnosis not present

## 2017-01-04 MED ORDER — VENLAFAXINE HCL ER 75 MG PO CP24: 75.0000 mg | ORAL_CAPSULE | Freq: Every day | ORAL | 4 refills | Status: DC

## 2017-01-04 NOTE — Progress Notes (Signed)
[facility]  [phone] [phone]     ID: Brandi Stephens DOB: [birth year]  MR#: [id number]  HYQ#:[id number]  Patient Care Team: Girtha Rm, NP-C as PCP - General (Family Medicine) Sairah Knobloch, Virgie Dad, MD as Consulting Physician (Oncology) Stark Klein, MD as Consulting Physician (General Surgery) Kyung Rudd, MD as Consulting Physician (Radiation Oncology) [name], MD as Consulting Physician (Family Medicine) [name], MD as Consulting Physician (Neurosurgery) Griselda Miner, MD as Consulting Physician (Dermatology) Brandi Stephens, Brandi Massed, NP as Nurse Practitioner (Hematology and Oncology) Brandi Bond, MD as Consulting Physician (Orthopedic Surgery) PCP: Girtha Rm, NP-C GYN: OTHER MD:  CHIEF COMPLAINT: Estrogen receptor positive breast cancer  CURRENT TREATMENT: Anastrozole   BREAST CANCER HISTORY: From the original intake note:  Brandi Stephens started to feel some right breast pain and soreness in March 2017. She brought it to Dr. Lanice Shirts nurse practitioner, Loletha Carrow and since attention, and she was set up for bilateral diagnostic mammography with tomography at the [facility] 07/02/2015 area did the breast density was category C. The patient is status post bilateral reduction mammoplasties. Mammography showed no suspicious masses or calcifications in the right breast, and ultrasonography of the area where she has discomfort shows an area of distortion in the right breast at the 8:00 position 12 cm from the nipple measuring 1.4 cm thought to represent scarring related to the prior reduction surgery, but warranting further evaluation.  Also, in the left breast there was an area of distortion in the upper inner quadrant noted mammographically. Targeted ultrasound evaluating the left breast found no sonographic correlate to the mammographic findings. Ultrasound evaluation of both axillae were benign.  On 07/13/2015  Brandi Stephens underwent biopsy of the right breast area in question which showed only fibrocystic changes with adenosis. Biopsy of the left breast upper inner quadrant however showed invasive ductal carcinoma, grade 1, estrogen receptor 90% positive, progesterone receptor 70% positive, WITH strong staining intensity, with an MIB-1 of 3%, and no HER-2 amplification, the signals ratio being 0.97 and the number per cell 1.65.  The patient's subsequent history is as detailed below  INTERVAL HISTORY: Brandi Stephens returns today for follow-up of her estrogen receptor positive breast cancer. She stayed on anastrozole and slowly increased the dose, which helped. She notes persistent hot flashes. She has also been experiencing unexpected weight changes. Pt no longer experiences nausea and vomiting. She has been on lasix for some time.   REVIEW OF SYSTEMS: Brandi Stephens still has significant back and neck pain which is limiting to her. She is currently unable to do any kind of exercise. Shereports upper and lower extremity edema. She denies unusual headaches, visual changes, nausea, vomiting, or dizziness. There has been no unusual cough, phlegm production, or pleurisy. This been no change in bowel or bladder habits. She denies unexplained fatigue or unexplained weight loss, bleeding, rash, or fever. A detailed review of systems was otherwise entirely negative.    PAST MEDICAL HISTORY: Past Medical History:  Diagnosis Date  . Anxiety   . Arthritis   . Breast cancer ([facility])    left breast   . Breast cancer of lower-outer quadrant of right female breast ([facility]) 07/14/2015  . COPD (chronic obstructive pulmonary disease) ([facility])   . Hemorrhoids   . Hypertension   . Hypothyroidism   . Personal history of radiation therapy   . PONV (postoperative nausea and vomiting)   . Prediabetes     PAST SURGICAL HISTORY: Past Surgical History:  Procedure Laterality Date  .  ABDOMINAL HYSTERECTOMY  1985  . BASAL CELL CARCINOMA EXCISION   10/15   nose  . BREAST BIOPSY Right 08/24/2015  . BREAST BIOPSY Right 07/13/2015  . BREAST BIOPSY Left 07/13/2015  . BREAST EXCISIONAL BIOPSY Left 1972  . BREAST LUMPECTOMY Left 09/16/2015  . BREAST LUMPECTOMY WITH RADIOACTIVE SEED AND SENTINEL LYMPH NODE BIOPSY Left 09/16/2015   Procedure: BREAST LUMPECTOMY WITH RADIOACTIVE SEED AND SENTINEL LYMPH NODE BIOPSY;  Surgeon: Stark Klein, MD;  Location: [facility];  Service: General;  Laterality: Left;  . BREAST REDUCTION SURGERY  1998  . [place]   hallo  . COLECTOMY  2005   right side  . EYE SURGERY Left 07/2014   cataract removed 3 months later  . HAND SURGERY Left   . [place]  2008  . KNEE ARTHROSCOPY Right 2002  . REDUCTION MAMMAPLASTY Bilateral 1996  . TONSILLECTOMY    . TUBAL LIGATION      FAMILY HISTORY Family History  Problem Relation Age of Onset  . Atrial fibrillation Mother   . Breast cancer Maternal Aunt   . Lung cancer Maternal Uncle   . Stomach cancer Maternal Grandmother   . Lung cancer Maternal Aunt   . Brain cancer Maternal Aunt   . Prostate cancer Maternal Grandfather   . Stroke Paternal Grandmother   . Cancer Paternal Grandfather   The patient's father died at the age of 32 and her mother at the age of 44. The patient had no brothers, 2 sisters. The patient's mother was diagnosed with uterine cancer at the age of 64. The patient's sister was diagnosed with labial cancer at the age of 41. There is a maternal aunt with a history of breast cancer but the patient does not know at what age she was diagnosed. There is also on the mother's side history of lung cancer brain cancer and stomach cancer.   GYNECOLOGIC HISTORY:  No LMP recorded. Patient has had a hysterectomy.  Menarche age 67 first live birth age 56, the patient is [place] P3. She underwent hysterectomy without salpingo-oophorectomy at age 27. She took hormone replacement only for a few months.   SOCIAL HISTORY:    the patient is a retired Architect. Her husband on is also retired. He used to work for [employer]. Daughter [name] lives in [place] and she is a Wellsite geologist, currently not employed. Daughter [name] lives in [place] and has a Brewing technologist in social work but is not currently working. Son [name] died at age 69 from causes not clear after autopsy. He had had significant neurologic damage following an accident     ADVANCED DIRECTIVES:  not in place    HEALTH MAINTENANCE: Social History  Substance Use Topics  . Smoking status: Current Some Day Smoker    Packs/day: 1.00    Years: 35.00    Types: Cigarettes    Last attempt to quit: 03/15/2010  . Smokeless tobacco: Never Used  . Alcohol use 0.6 oz/week    1 Glasses of wine per week     Comment: 1 drink nightly     Colonoscopy: 2015/[facility]   PAP:  Bone density: 2016  Lipid panel:  Allergies  Allergen Reactions  . Ampicillin Hives    Severe reaction in February 2017 1.5 month to have hives to go away  . Codeine Nausea And Vomiting  . Morphine And Related Itching  . Penicillins Other (See Comments)    Has patient had a PCN reaction  causing immediate rash, facial/tongue/throat swelling, SOB or lightheadedness with hypotension: No Has patient had a PCN reaction causing severe rash involving mucus membranes or skin necrosis: No Has patient had a PCN reaction that required hospitalization No Has patient had a PCN reaction occurring within the last 10 years: No If all of the above answers are "NO", then may proceed with Cephalosporin use.     Current Outpatient Prescriptions  Medication Sig Dispense Refill  . allopurinol (ZYLOPRIM) 100 MG tablet Take 2 tablets (200 mg total) by mouth daily. 180 tablet 1  . aluminum chloride (DRYSOL) 20 % external solution Apply topically at bedtime. (Patient not taking: Reported on 09/14/2016) 35 mL 0  . anastrozole (ARIMIDEX) 1 MG tablet Take 1 mg by mouth daily.    Marland Kitchen atorvastatin  (LIPITOR) 20 MG tablet Take 1 tablet (20 mg total) by mouth daily. 90 tablet 0  . azithromycin (ZITHROMAX Z-PAK) 250 MG tablet Take 2 tabs on day one, then 1 tab on days 2-5. 6 each 0  . baclofen (LIORESAL) 10 MG tablet Take 10 mg by mouth 3 (three) times daily.    . benzonatate (TESSALON) 200 MG capsule Take 1 capsule (200 mg total) by mouth 2 (two) times daily as needed for cough. 20 capsule 0  . BIOTIN 5000 PO Take 5,000 capsules by mouth daily.     . Calcium Carbonate-Vitamin D (CALCIUM 500/D PO) Take by mouth.    . Cholecalciferol (D3 VITAMIN PO) Take by mouth.    . furosemide (LASIX) 20 MG tablet Take 20 mg by mouth daily as needed. Reported on 10/28/2015    . KLOR-CON 10 10 MEQ tablet Take 10 mEq by mouth as needed. Reported on 10/28/2015    . levothyroxine (SYNTHROID, LEVOTHROID) 75 MCG tablet TAKE ONE TABLET BY MOUTH DAILY 90 tablet 0  . MAGNESIUM CITRATE PO Take 2 tablets by mouth at bedtime. Reported on 09/29/2015    . Melatonin-Pyridoxine (MELATONIN-B6 PO) Take 1 tablet by mouth at bedtime.    . metoprolol succinate (TOPROL-XL) 25 MG 24 hr tablet Take 0.5 tablets (12.5 mg total) by mouth daily. 45 tablet 3  . primidone (MYSOLINE) 50 MG tablet Take 1 tablet (50 mg total) by mouth at bedtime. 90 tablet 3  . pyridOXINE (VITAMIN B-6) 100 MG tablet Take 100 mg by mouth daily.    Marland Kitchen rOPINIRole (REQUIP) 0.25 MG tablet TAKE ONE TABLET BY MOUTH AT BEDTIME 90 tablet 0  . rOPINIRole (REQUIP) 0.5 MG tablet Take 1 tablet (0.5 mg total) by mouth at bedtime. (Patient taking differently: Take 1 mg by mouth at bedtime. ) 90 tablet 3  . traMADol (ULTRAM) 50 MG tablet TAKE ONE TABLET BY MOUTH EVERY 6 HOURS AS NEEDED 90 tablet 0  . valsartan-hydrochlorothiazide (DIOVAN-HCT) 320-12.5 MG tablet TAKE ONE TABLET BY MOUTH DAILY 90 tablet 0   No current facility-administered medications for this visit.     OBJECTIVE: Middle-aged white woman  Who appears stated age  85:   01/04/17 1507  BP: 121/81    Pulse: (!) 107  Resp: 19  Temp: 98.2 F (36.8 C)  SpO2: 97%     Body mass index is 38.15 kg/m.    ECOG FS:1 - Symptomatic but completely ambulatory Filed Weights   01/04/17 1507  Weight: 247 lb 3.2 oz (112.1 kg)   Sclerae unicteric, EOMs intact Oropharynx clear and moist No cervical or supraclavicular adenopathy Lungs no rales or rhonchi Heart regular rate and rhythm Abd soft, nontender, positive bowel sounds  MSK no focal spinal tenderness, no upper extremity lymphedema Neuro: nonfocal, well oriented, appropriate affect Breasts: The right breast is unremarkable. The left breast is status post lumpectomy and radiation. There is no evidence of local recurrence.both axillae are benign.  LAB RESULTS:  CMP     Component Value Date/Time   NA 140 07/08/2016 1042   NA 142 06/28/2016 1458   K 4.7 07/08/2016 1042   K 5.2 (H) 06/28/2016 1458   CL 109 07/08/2016 1042   CO2 25 07/08/2016 1042   CO2 24 06/28/2016 1458   GLUCOSE 116 (H) 07/08/2016 1042   GLUCOSE 114 06/28/2016 1458   BUN 24 07/08/2016 1042   BUN 23.6 06/28/2016 1458   CREATININE 0.97 07/08/2016 1042   CREATININE 1.0 06/28/2016 1458   CALCIUM 9.1 07/08/2016 1042   CALCIUM 10.3 06/28/2016 1458   PROT 6.8 06/28/2016 1458   ALBUMIN 3.6 06/28/2016 1458   AST 18 06/28/2016 1458   ALT 22 06/28/2016 1458   ALKPHOS 101 06/28/2016 1458   BILITOT 0.48 06/28/2016 1458   GFRNONAA >60 05/20/2015 0820   GFRAA >60 05/20/2015 0820    INo results found for: SPEP, UPEP  Lab Results  Component Value Date   WBC 11.4 (H) 06/28/2016   NEUTROABS 8.7 (H) 06/28/2016   HGB 15.4 06/28/2016   HCT 46.8 (H) 06/28/2016   MCV 97.6 06/28/2016   PLT 242 06/28/2016      Chemistry      Component Value Date/Time   NA 140 07/08/2016 1042   NA 142 06/28/2016 1458   K 4.7 07/08/2016 1042   K 5.2 (H) 06/28/2016 1458   CL 109 07/08/2016 1042   CO2 25 07/08/2016 1042   CO2 24 06/28/2016 1458   BUN 24 07/08/2016 1042   BUN 23.6  06/28/2016 1458   CREATININE 0.97 07/08/2016 1042   CREATININE 1.0 06/28/2016 1458      Component Value Date/Time   CALCIUM 9.1 07/08/2016 1042   CALCIUM 10.3 06/28/2016 1458   ALKPHOS 101 06/28/2016 1458   AST 18 06/28/2016 1458   ALT 22 06/28/2016 1458   BILITOT 0.48 06/28/2016 1458       No results found for: LABCA2  No components found for: LABCA125  No results for input(s): INR in the last 168 hours.  Urinalysis    Component Value Date/Time   COLORURINE YELLOW 05/15/2015 [place] 05/15/2015 1218   LABSPEC 1.012 05/15/2015 1218   PHURINE 5.0 05/15/2015 [place] 05/15/2015 1218   HGBUR NEGATIVE 05/15/2015 1218   BILIRUBINUR n 11/25/2015 1712   KETONESUR NEGATIVE 05/15/2015 1218   PROTEINUR n 11/25/2015 1712   PROTEINUR NEGATIVE 05/15/2015 1218   UROBILINOGEN negative 11/25/2015 1712   NITRITE n 11/25/2015 1712   NITRITE NEGATIVE 05/15/2015 1218   LEUKOCYTESUR small (1+) (A) 11/25/2015 1712      ELIGIBLE FOR AVAILABLE RESEARCH PROTOCOL: no  STUDIES: She had a bilateral diagnostic mammography with tomography at the [facility] 08/16/2016. The breast density was category C. There was no evidence of malignancy, status post left lumpectomy.  [facility] with a T score of -2.0 showing osteopenia.    ASSESSMENT: 25 y.o. Brandi Stephens woman  Status post left breast upper inner quadrant biopsy 07/13/2015 for a clinical [place] N0 invasive ductal carcinoma, grade 1, estrogen and progesterone receptor positive, HER-2 nonamplified, with an MIB-1 of 3%.  (a) biopsy of a suspicious lesion in the right  breast proved to be a small fibroadenoma  (1) left lumpectomy and sentinel lymph node sampling 09/16/2015 showed a pT1a pN0 invasive ductal carcinoma, grade 1, with negative margins, stage IA    (2) adjuvant radiation completed 11/18/2015  (3) To start anastrozole 02/16/2010.  PLAN: Brandi Stephens is now a  little over a year out from definitive surgery for her breast cancer with no evidence of disease recurrence. This is favorable.  She has made a new Brandi Stephens's effort and has gotten herself on anastrozole. She understands that she has a very good prognosis even without this drug, but taking it will cut her risk of breast cancer in half including the risk of her developing a new breast cancer.  The one side effect that she can blame on anastrozole is the hot flashes. I think she would benefit from venlafaxine. I have written her for 75 mg. If after a month or so she would like to try taking 150 mg, not just for hot flashes butfor mood stabilization, we could up the dose then. She understands it is important for her not to simply discontinue this drug.  Although of course patients do associated symptoms with the drugs that are taking, the weight gain and swelling that she is experiencing really is not going to be related to the anastrozole. She has work with Dr. Maryjean Ka in terms of pain control and I encouraged her to continue to do that. I also suggested she start a water aerobics program. I think he would do wonders for her sense of well-being  As far as the weight concern goes, she needs to stop all carbohydrates. We discussed how to do that.   Trinisha Paget, Virgie Dad, MD  01/04/17 3:46 PM Medical Oncology and Hematology [facility] [address] [place], [place] [address] Tel. [phone]    Fax. [phone]  This document serves as a record of services personally performed by Chauncey Cruel, MD. It was created on her behalf by Margit Banda, a trained medical scribe. The creation of this record is based on the scribe's personal observations and the provider's statements to them. This document has been checked and approved by the attending provider.

## 2017-01-04 NOTE — Telephone Encounter (Signed)
Spoke with patient about appts scheduled per 9/19 los. Did not want avs or calendar

## 2017-01-06 ENCOUNTER — Telehealth: Payer: Self-pay

## 2017-01-06 MED ORDER — VENLAFAXINE HCL ER 75 MG PO CP24: 75.0000 mg | ORAL_CAPSULE | Freq: Every day | ORAL | 4 refills | Status: DC

## 2017-01-06 NOTE — Telephone Encounter (Signed)
Pt called asking about antidepressant Dr Jana Hakim ordered on Wednesday 9/19. Dr Jana Hakim sent rx for venlafaxine to [facility] mail order pharmacy. Called pt and lvm asking if she wants [place] to change that to the local [facility].

## 2017-01-06 NOTE — Telephone Encounter (Signed)
S/w pt and she does not use [employer], this was deleted from [facility]. Venlafaxine resent to [employer].

## 2017-01-30 ENCOUNTER — Ambulatory Visit (INDEPENDENT_AMBULATORY_CARE_PROVIDER_SITE_OTHER): Payer: PPO | Admitting: Family Medicine

## 2017-01-30 ENCOUNTER — Encounter: Payer: Self-pay | Admitting: Family Medicine

## 2017-01-30 VITALS — BP 120/64 | HR 102 | Temp 98.5°F | Resp 18 | Wt 239.6 lb

## 2017-01-30 DIAGNOSIS — R05 Cough: Secondary | ICD-10-CM

## 2017-01-30 DIAGNOSIS — J209 Acute bronchitis, unspecified: Secondary | ICD-10-CM | POA: Diagnosis not present

## 2017-01-30 DIAGNOSIS — R059 Cough, unspecified: Secondary | ICD-10-CM

## 2017-01-30 MED ORDER — OLMESARTAN MEDOXOMIL-HCTZ 40-12.5 MG PO TABS: 1.0000 | ORAL_TABLET | Freq: Every day | ORAL | 0 refills | Status: DC

## 2017-01-30 MED ORDER — AZITHROMYCIN 250 MG PO TABS: ORAL_TABLET | ORAL | 0 refills | Status: DC

## 2017-01-30 MED ORDER — TRAMADOL HCL 50 MG PO TABS: 50.0000 mg | ORAL_TABLET | Freq: Four times a day (QID) | ORAL | 0 refills | Status: DC | PRN

## 2017-01-30 MED ORDER — PROMETHAZINE-DM 6.25-15 MG/5ML PO SYRP: 5.0000 mL | ORAL_SOLUTION | Freq: Every evening | ORAL | 0 refills | Status: DC | PRN

## 2017-01-30 NOTE — Progress Notes (Signed)
Chief Complaint  Patient presents with  . sick    cough since last week and chills, ache, low grade fever. needs refill on tramadol.    Subjective:  Brandi Stephens is a 71 y.o. female who presents for a 2 week history of cough that is productive of yellowish sputum, low grade fever, chills, fatigue, sore throat, post nasal drainage and frontal headache.  Cough is worse at night. She is propping herself up on pillows. Would like something to help her sleep at night.   Denies ear pain, chest pain, palpitations, shortness of breath, wheezing, abdominal pain, N/V/D, LE edema.   Treatment to date: Aleve, decongestant, cough suppressant .  Positive sick contacts.  No other aggravating or relieving factors.    Requests refill of Tramadol for chronic back pain.   She is taking valsartan-HCTZ and has not received a letter from her pharmacy to switch due to recall.   ROS as in subjective.   Objective: Vitals:   01/30/17 1453 01/30/17 1527  BP: 120/64   Pulse: (!) 102   Resp: 16 18  Temp: 98.5 F (36.9 C)   SpO2: 93% 95%    General appearance: Alert, WD/WN, no distress, mildly ill appearing                             Skin: warm, no rash                           Head: +frontal sinus tenderness                            Eyes: conjunctiva normal, corneas clear, PERRLA                            Ears: pearly TMs, external ear canals normal                          Nose: septum midline, turbinates swollen, with erythema and clear discharge             Mouth/throat: MMM, tongue normal, mild pharyngeal erythema                           Neck: supple, no adenopathy, no thyromegaly, nontender                          Heart: RRR, normal S1, S2, no murmurs                         Lungs: CTA bilaterally, no wheezes, rales, or rhonchi. Normal work of    breathing.    Extremities: no LE edema      Assessment: Acute bronchitis, unspecified organism - Plan: azithromycin (ZITHROMAX Z-PAK) 250  MG tablet, promethazine-dextromethorphan (PROMETHAZINE-DM) 6.25-15 MG/5ML syrup  Cough   Plan: Discussed diagnosis and treatment of bronchitis. She is not in any distress. Last antibiotic was in May 2018 and this was for similar symptoms. Z-pack was effective. Will treat her with Z-pack today. Promethazine DM prescribed for cough at bedtime. Advised against using any other sedating substances. Suggested symptomatic OTC remedies. She may use her albuterol inhaler as needed for cough, wheezing.  Strict precautions to call  or return if her symptoms are getting worse or not improving.  Prescription for Tramadol given to patient.  Switched valsartan-HCTZ to [employer]

## 2017-01-30 NOTE — Patient Instructions (Addendum)
Start antibiotic today. Try Mucinex and stay well hydrated. Take the promethazine DM at bedtime. This medication is sedating so do not drive or drink alcohol with it.   Use your albuterol inhaler as needed for cough and wheezing over the next few days.   Call or return if you get worse or are not improving in 2-3 days.

## 2017-02-01 ENCOUNTER — Other Ambulatory Visit: Payer: Self-pay

## 2017-02-01 MED ORDER — ANASTROZOLE 1 MG PO TABS: 1.0000 mg | ORAL_TABLET | Freq: Every day | ORAL | 3 refills | Status: DC

## 2017-02-01 NOTE — Telephone Encounter (Signed)
Phone call received from [facility] pharmacy stating they need a prescription for anastrozole for this pt.  Pt previously used [employer] order and the prescription does not transfer.  New prescription sent electronically by this RN.

## 2017-02-03 ENCOUNTER — Telehealth: Payer: Self-pay | Admitting: Internal Medicine

## 2017-02-03 ENCOUNTER — Other Ambulatory Visit: Payer: Self-pay | Admitting: Medical

## 2017-02-03 DIAGNOSIS — J209 Acute bronchitis, unspecified: Secondary | ICD-10-CM

## 2017-02-03 MED ORDER — LEVOFLOXACIN 500 MG PO TABS: 500.0000 mg | ORAL_TABLET | Freq: Every day | ORAL | 0 refills | Status: AC

## 2017-02-03 MED ORDER — PREDNISONE 20 MG PO TABS: ORAL_TABLET | ORAL | 0 refills | Status: DC

## 2017-02-03 MED ORDER — PROMETHAZINE-DM 6.25-15 MG/5ML PO SYRP
5.0000 mL | ORAL_SOLUTION | Freq: Every evening | ORAL | 0 refills | Status: DC | PRN
Start: 2017-02-03 — End: 2017-08-07

## 2017-02-03 NOTE — Telephone Encounter (Signed)
I spoke to patient.  Sent round of Levaquin, prednisone, refilled cough syrup.  She has albuterol inhaler.  She will go to the ED this weekend if worsening.  otherwise f/u Monday

## 2017-02-03 NOTE — Telephone Encounter (Signed)
Pt seen Brandi Stephens on 10/15 for being sick, and coughing. Pt was given zpak and cough med. She states she is on her last day of zpak and does not feel any better. She is 0% better than she was. She is still constantly cough and sometimes throws up from coughing so much. She states the cough medicine is helping some but is almost out of it. Pt is not having a fever, but some chills, coughing up yellow sputum. She has been using mucinex, and using inhaler and using a nasal spray. Please advise what else she can do since shes not feeling better. Pt uses [name]

## 2017-03-02 ENCOUNTER — Other Ambulatory Visit: Payer: Self-pay | Admitting: Family Medicine

## 2017-03-02 ENCOUNTER — Other Ambulatory Visit: Payer: Self-pay | Admitting: Internal Medicine

## 2017-03-15 ENCOUNTER — Other Ambulatory Visit: Payer: Self-pay | Admitting: Family Medicine

## 2017-03-15 NOTE — Telephone Encounter (Signed)
Ok to refill. Thanks 

## 2017-03-15 NOTE — Telephone Encounter (Signed)
Is this okay to refill? 

## 2017-03-24 ENCOUNTER — Ambulatory Visit: Payer: PPO | Admitting: Neurology

## 2017-04-06 ENCOUNTER — Other Ambulatory Visit: Payer: Self-pay | Admitting: Family Medicine

## 2017-05-01 ENCOUNTER — Other Ambulatory Visit: Payer: Self-pay | Admitting: Family Medicine

## 2017-05-01 DIAGNOSIS — H26492 Other secondary cataract, left eye: Secondary | ICD-10-CM | POA: Diagnosis not present

## 2017-05-01 DIAGNOSIS — H524 Presbyopia: Secondary | ICD-10-CM | POA: Diagnosis not present

## 2017-05-11 ENCOUNTER — Telehealth: Payer: Self-pay | Admitting: Oncology

## 2017-05-11 NOTE — Telephone Encounter (Signed)
Left message for patient to call back to R/S 09-25-17 appointment.

## 2017-05-18 ENCOUNTER — Other Ambulatory Visit: Payer: Self-pay | Admitting: Family Medicine

## 2017-05-18 NOTE — Telephone Encounter (Signed)
Is this okay to refill? 

## 2017-05-18 NOTE — Telephone Encounter (Signed)
Pt takes this once a day for pt. Pt was notified to call back and schedule before this refill is up. Please send med

## 2017-05-18 NOTE — Telephone Encounter (Signed)
Please find out what pain she is taking this for and how often she is taking it. Ok to refill but then we will need a visit scheduled for future refills. Thanks.

## 2017-05-21 ENCOUNTER — Other Ambulatory Visit: Payer: Self-pay | Admitting: Family Medicine

## 2017-05-22 NOTE — Telephone Encounter (Signed)
Is this okay to refill? 

## 2017-05-22 NOTE — Telephone Encounter (Signed)
ok 

## 2017-05-28 ENCOUNTER — Other Ambulatory Visit: Payer: Self-pay | Admitting: Family Medicine

## 2017-05-28 ENCOUNTER — Other Ambulatory Visit: Payer: Self-pay | Admitting: Oncology

## 2017-06-11 ENCOUNTER — Other Ambulatory Visit: Payer: Self-pay | Admitting: Family Medicine

## 2017-06-21 ENCOUNTER — Other Ambulatory Visit: Payer: Self-pay | Admitting: Oncology

## 2017-06-21 NOTE — Progress Notes (Signed)
Brandi Stephens called to report that she was having constipation and severe tooth decay.  She wondered if that was related to the anastrozole.  It clearly is not.  However she is having quite a bit more joint pain.  She does have history of spinal surgery and that could be it, but she has pain in other joints not clearly related.  I think the best thing to do is to stop the medication for 6 weeks.  She will call us then to tell us how she is doing.  If she is no better we will returned to anastrozole but otherwise we will consider an alternative, possibly exemestane or tamoxifen.

## 2017-07-10 ENCOUNTER — Other Ambulatory Visit: Payer: Self-pay | Admitting: Family Medicine

## 2017-07-10 MED ORDER — COLCHICINE 0.6 MG PO TABS: ORAL_TABLET | ORAL | 0 refills | Status: DC

## 2017-07-10 NOTE — Progress Notes (Unsigned)
Pt called and states that she is having a flare up. She has had a gout attack Sunday, can't walk, swollen foot, allopurinol taking daily. Tramadol is not touching it, she has tired aleve.   Pt was told to take 2 tablets x1 and then hour later take 1 tablet. Should see improvement within the next 24 hours. colchine 0.6mg 

## 2017-07-20 ENCOUNTER — Other Ambulatory Visit: Payer: Self-pay | Admitting: Family Medicine

## 2017-07-20 ENCOUNTER — Other Ambulatory Visit: Payer: Self-pay | Admitting: Oncology

## 2017-07-20 DIAGNOSIS — Z853 Personal history of malignant neoplasm of breast: Secondary | ICD-10-CM

## 2017-07-26 ENCOUNTER — Encounter: Payer: Self-pay | Admitting: Family Medicine

## 2017-07-26 ENCOUNTER — Ambulatory Visit: Payer: Self-pay | Admitting: Family Medicine

## 2017-07-26 ENCOUNTER — Ambulatory Visit (INDEPENDENT_AMBULATORY_CARE_PROVIDER_SITE_OTHER): Payer: PPO | Admitting: Family Medicine

## 2017-07-26 VITALS — BP 138/84 | HR 61 | Temp 98.4°F | Ht 67.0 in | Wt 232.8 lb

## 2017-07-26 DIAGNOSIS — Z79899 Other long term (current) drug therapy: Secondary | ICD-10-CM

## 2017-07-26 DIAGNOSIS — K5900 Constipation, unspecified: Secondary | ICD-10-CM

## 2017-07-26 DIAGNOSIS — F410 Panic disorder [episodic paroxysmal anxiety] without agoraphobia: Secondary | ICD-10-CM

## 2017-07-26 DIAGNOSIS — F411 Generalized anxiety disorder: Secondary | ICD-10-CM | POA: Diagnosis not present

## 2017-07-26 DIAGNOSIS — E039 Hypothyroidism, unspecified: Secondary | ICD-10-CM

## 2017-07-26 DIAGNOSIS — K649 Unspecified hemorrhoids: Secondary | ICD-10-CM | POA: Diagnosis not present

## 2017-07-26 DIAGNOSIS — R7303 Prediabetes: Secondary | ICD-10-CM | POA: Diagnosis not present

## 2017-07-26 DIAGNOSIS — I10 Essential (primary) hypertension: Secondary | ICD-10-CM | POA: Diagnosis not present

## 2017-07-26 MED ORDER — LINACLOTIDE 145 MCG PO CAPS: 145.0000 ug | ORAL_CAPSULE | Freq: Every day | ORAL | 1 refills | Status: DC

## 2017-07-26 MED ORDER — LORAZEPAM 0.5 MG PO TABS: 0.5000 mg | ORAL_TABLET | Freq: Two times a day (BID) | ORAL | 0 refills | Status: DC | PRN

## 2017-07-26 MED ORDER — METOPROLOL SUCCINATE ER 25 MG PO TB24: 12.5000 mg | ORAL_TABLET | Freq: Every day | ORAL | 1 refills | Status: DC

## 2017-07-26 MED ORDER — HYDROCORTISONE 2.5 % RE CREA: 1.0000 "application " | TOPICAL_CREAM | Freq: Two times a day (BID) | RECTAL | 0 refills | Status: DC

## 2017-07-26 MED ORDER — OLMESARTAN MEDOXOMIL-HCTZ 40-12.5 MG PO TABS: 1.0000 | ORAL_TABLET | Freq: Every day | ORAL | 1 refills | Status: DC

## 2017-07-26 MED ORDER — ATORVASTATIN CALCIUM 20 MG PO TABS: 20.0000 mg | ORAL_TABLET | Freq: Every day | ORAL | 1 refills | Status: DC

## 2017-07-26 NOTE — Patient Instructions (Addendum)
Start taking one capsule of Linzess in the mornings on an empty stomach with a full glass of water. Do not eat for 30 minutes after taking this.   Try the Anusol HC cream.   Take the lorazepam as needed but do not drive, drink alcohol or use machinery with this medication.   Follow up in 1 week for multiple issues.    You can call to schedule your appointment with a psychiatrist.  A few offices are listed below for you to call.    [facility]  [address] [place], [place], [place] [address]  Phone: [phone]  [facility] [address] [place], [place] [address]  Phone: [phone]

## 2017-07-26 NOTE — Progress Notes (Signed)
Subjective:    Patient ID: Brandi Stephens, female    DOB: 03-03-1946, 71 y.o.   MRN: [id number]  HPI Chief Complaint  Patient presents with  . other    constipation, Hemorrhoid, lack of sleeep, anxiety   She is here with multiple complaints. She has a history of GAD and states recently her anxiety has progressively worsened and she has been having panic attacks. She also reports having difficulty sleeping. States she is only sleeping 1-2 hours at a time. States melatonin is no longer working.  States her anxiety is making her avoid leaving her house and doing things with friends at times.  Denies suicidal ideations.    Requesting that I prescribe lorazepam for her. States she has taken this in the past and has done well with this medication.  She does have a history of polypharmacy and when she established care with me several years ago she was weaning off of several psychotropic medications and narcotic pain medications.    States she stopped Effexor several weeks ago and she was taking this for hot flashes related to cancer treatment.   States she has taken Prozac in the past and this made her feel "nunb". She reports trying other unknown mediations in the past for anxiety as well. States she has seen a psychiatrist and a psychologist in the distant past. She is not currently under the care of either nor is she involved in counseling.   Reports her health concerns have also caused her increased anxiety. She has ongoing back pain and  states her neurosurgeon wants to have surgery again but she is not in favor of this.  States she is going to a [place] and this is helping.     Denies regular alcohol use. Smokes some days.  History of thyroid disorder and is taking levothyroxine daily. No recent lab work.    She also complains of constipation. This has been a chronic issue for her. States she has been taking daily magnesium, stool softeners and Miralax occasionally.    States she has issues with hemorrhoids and she is having a major flare up at the present. Reports mild bright red blood per rectum. States she had surgery to remove hemorrhoids in the past.  States she started using preparation H with lidocaine yesterday and has noticed mild improvement.  No fever, chills, abdominal pain, N/V.   Denies dizziness, chest pain, palpitations, shortness of breath,  urinary symptoms, LE edema.   She is also requesting a letter to be dismissed from jury duty next month due to her health conditions.   Reviewed allergies, medications, past medical, surgical, family, and social history.    Review of Systems Pertinent positives and negatives in the history of present illness.     Objective:   Physical Exam  Constitutional: She is oriented to person, place, and time. She appears well-developed and well-nourished. No distress.  HENT:  Mouth/Throat: Uvula is midline, oropharynx is clear and moist and mucous membranes are normal.  Eyes: Pupils are equal, round, and reactive to light. Conjunctivae and lids are normal.  Neck: Normal range of motion. Neck supple. No thyromegaly present.  Cardiovascular: Normal rate, regular rhythm and intact distal pulses.  No LE edema   Pulmonary/Chest: Effort normal and breath sounds normal.  Abdominal: Soft. Normal appearance and bowel sounds are normal. There is no hepatosplenomegaly. There is no tenderness. There is no rigidity, no rebound, no guarding, no CVA tenderness, no tenderness at McBurney's point  and negative Murphy's sign.  Genitourinary:  Genitourinary Comments: Declines rectal exam due to severe discomfort   Lymphadenopathy:    She has no cervical adenopathy.  Neurological: She is alert and oriented to person, place, and time. She has normal strength. No cranial nerve deficit or sensory deficit. Gait normal.  Skin: Skin is warm and dry. No pallor.  Psychiatric: Her speech is normal and behavior is normal. Thought  content normal. Her mood appears anxious. Cognition and memory are normal.  Slightly anxious    BP 138/84 (BP Location: Right Arm, Patient Position: Sitting)   Pulse 61   Temp 98.4 F (36.9 C)   Ht 5\' 7"  (1.702 m)   Wt 232 lb 12.8 oz (105.6 kg)   SpO2 99%   BMI 36.46 kg/m       Assessment & Plan:  GAD (generalized anxiety disorder) - Plan: LORazepam (ATIVAN) 0.5 MG tablet, CBC with Differential/Platelet, Comprehensive metabolic panel, TSH  Prediabetes - Plan: Hemoglobin A1c  Panic attacks - Plan: CBC with Differential/Platelet, Comprehensive metabolic panel, TSH  Hemorrhoids, unspecified hemorrhoid type - Plan: hydrocortisone (ANUSOL-HC) 2.5 % rectal cream  Constipation, unspecified constipation type - Plan: linaclotide (LINZESS) 145 MCG CAPS capsule  Essential hypertension - Plan: CBC with Differential/Platelet, Comprehensive metabolic panel, TSH  Hypothyroidism, unspecified type - Plan: TSH  Medication management - Plan: TSH  Discussed that she is having increased issues with anxiety, panic attacks and sleep disorder with a history of the same. Due to complex history of polypharmacy, I will prescribe her a short course of lorazepam as requested and have her follow up with a psychiatrist for further evaluation and treatment. Discussed potential long term side effects of benzodiazepines.  No sleep medication prescribed for now and she was advised that this medication may interfere with her sleep architecture.  She is avoiding alcohol and caffeine before bed and does not appear to be self medicating. No sign of SI or HI.  A list of psychiatrists was given to her and she is aware that she needs to call and schedule.  Discussed checking labs to rule out any underlying etiology for worsening anxiety.  Counseled on constipation and management of this. She has tried multiple OTC therapies. Plan to start her on Linzess once daily. Discussed proper usage and side effects. Anusol-HC cream  prescribed. She declines a rectal exam today due to discomfort but I will have her return in one week to see if symptoms have improved.

## 2017-07-27 LAB — CBC WITH DIFFERENTIAL/PLATELET
BASOS: 1 %
Basophils Absolute: 0.1 10*3/uL (ref 0.0–0.2)
EOS (ABSOLUTE): 0.3 10*3/uL (ref 0.0–0.4)
Eos: 3 %
HEMOGLOBIN: 16.4 g/dL — AB (ref 11.1–15.9)
Hematocrit: 47.4 % — ABNORMAL HIGH (ref 34.0–46.6)
IMMATURE GRANULOCYTES: 1 %
Immature Grans (Abs): 0.1 10*3/uL (ref 0.0–0.1)
LYMPHS: 20 %
Lymphocytes Absolute: 2.1 10*3/uL (ref 0.7–3.1)
MCH: 33.4 pg — ABNORMAL HIGH (ref 26.6–33.0)
MCHC: 34.6 g/dL (ref 31.5–35.7)
MCV: 97 fL (ref 79–97)
Monocytes Absolute: 0.6 10*3/uL (ref 0.1–0.9)
Monocytes: 5 %
NEUTROS PCT: 70 %
Neutrophils Absolute: 7.3 10*3/uL — ABNORMAL HIGH (ref 1.4–7.0)
PLATELETS: 227 10*3/uL (ref 150–379)
RBC: 4.91 x10E6/uL (ref 3.77–5.28)
RDW: 14.1 % (ref 12.3–15.4)
WBC: 10.5 10*3/uL (ref 3.4–10.8)

## 2017-07-27 LAB — COMPREHENSIVE METABOLIC PANEL
A/G RATIO: 2 (ref 1.2–2.2)
ALBUMIN: 4.3 g/dL (ref 3.5–4.8)
ALT: 17 IU/L (ref 0–32)
AST: 16 IU/L (ref 0–40)
Alkaline Phosphatase: 102 IU/L (ref 39–117)
BUN/Creatinine Ratio: 20 (ref 12–28)
BUN: 19 mg/dL (ref 8–27)
Bilirubin Total: 0.2 mg/dL (ref 0.0–1.2)
CO2: 22 mmol/L (ref 20–29)
Calcium: 9.5 mg/dL (ref 8.7–10.3)
Chloride: 103 mmol/L (ref 96–106)
Creatinine, Ser: 0.93 mg/dL (ref 0.57–1.00)
GFR, EST AFRICAN AMERICAN: 72 mL/min/{1.73_m2} (ref >59)
GFR, EST NON AFRICAN AMERICAN: 62 mL/min/{1.73_m2} (ref >59)
GLUCOSE: 116 mg/dL — AB (ref 65–99)
Globulin, Total: 2.1 g/dL (ref 1.5–4.5)
Potassium: 5.2 mmol/L (ref 3.5–5.2)
Sodium: 141 mmol/L (ref 134–144)
TOTAL PROTEIN: 6.4 g/dL (ref 6.0–8.5)

## 2017-07-27 LAB — TSH: TSH: 2.91 u[IU]/mL (ref 0.450–4.500)

## 2017-07-27 LAB — HEMOGLOBIN A1C
ESTIMATED AVERAGE GLUCOSE: 131 mg/dL
Hgb A1c MFr Bld: 6.2 % — ABNORMAL HIGH (ref 4.8–5.6)

## 2017-07-27 IMAGING — CR DG CHEST 1V PORT
1 series · 1 of 1 positions shown · non-contrast
Comparison: 03/11/2011

CLINICAL DATA: Fever, status post lumbosacral spine fusion on
03/20/2015.

EXAM:
PORTABLE CHEST 1 VIEW

[AP]
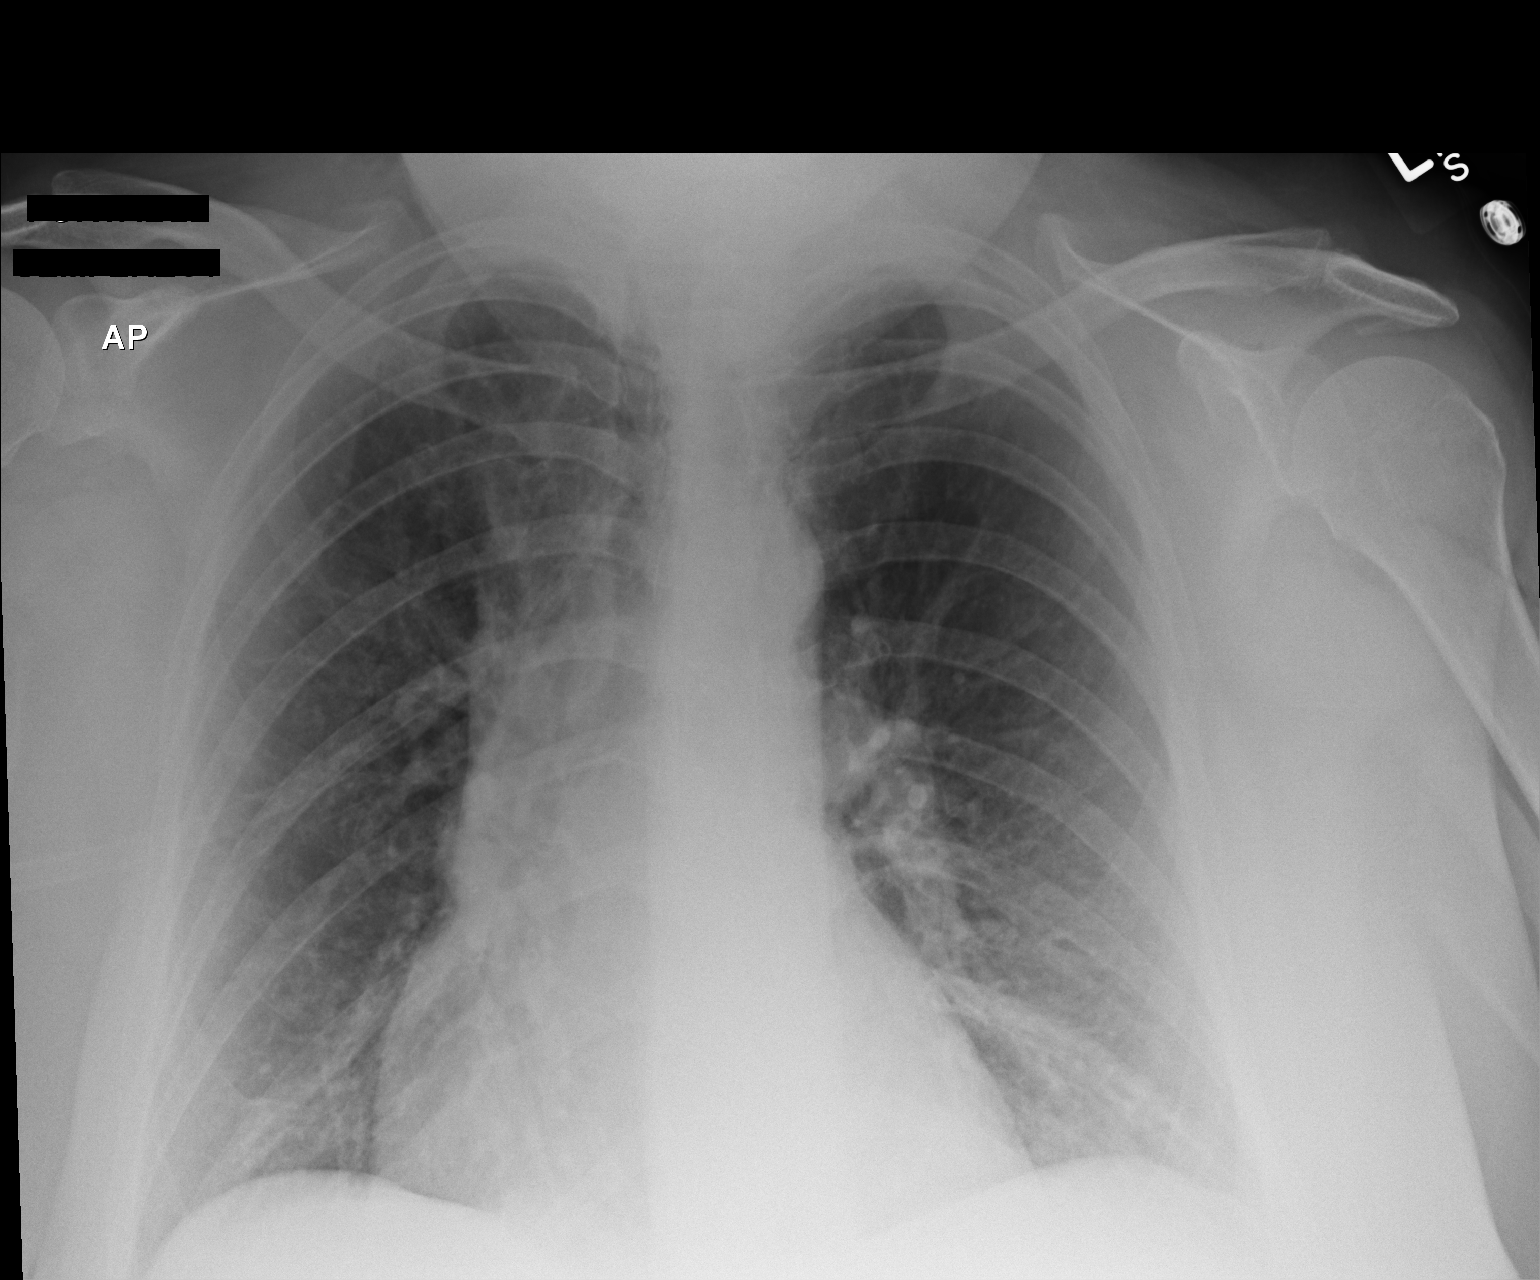

[1 of 1 positions shown; findings below may reference images not displayed]

FINDINGS: Cardiomediastinal silhouette is normal. Mediastinal contours appear
intact.

There is no evidence of pleural effusion or pneumothorax. There has
been interval development of a patchy airspace consolidation in the
left lower lobe.

Osseous structures are without acute abnormality. Soft tissues are
grossly normal.
IMPRESSION: Interval development of patchy airspace consolidation in the left
lower lobe, which may represent atelectasis versus infectious
consolidation.

## 2017-08-03 ENCOUNTER — Other Ambulatory Visit: Payer: Self-pay | Admitting: Family Medicine

## 2017-08-03 NOTE — Telephone Encounter (Signed)
Pt has an appt next week. Will do lipids then

## 2017-08-07 ENCOUNTER — Encounter: Payer: Self-pay | Admitting: Family Medicine

## 2017-08-07 ENCOUNTER — Ambulatory Visit (INDEPENDENT_AMBULATORY_CARE_PROVIDER_SITE_OTHER): Payer: PPO | Admitting: Family Medicine

## 2017-08-07 VITALS — BP 120/80 | HR 89 | Ht 67.0 in | Wt 234.4 lb

## 2017-08-07 DIAGNOSIS — F411 Generalized anxiety disorder: Secondary | ICD-10-CM

## 2017-08-07 DIAGNOSIS — K644 Residual hemorrhoidal skin tags: Secondary | ICD-10-CM | POA: Diagnosis not present

## 2017-08-07 DIAGNOSIS — E782 Mixed hyperlipidemia: Secondary | ICD-10-CM | POA: Diagnosis not present

## 2017-08-07 DIAGNOSIS — L989 Disorder of the skin and subcutaneous tissue, unspecified: Secondary | ICD-10-CM | POA: Diagnosis not present

## 2017-08-07 DIAGNOSIS — K5900 Constipation, unspecified: Secondary | ICD-10-CM

## 2017-08-07 NOTE — Patient Instructions (Signed)
You can try taking 1 mg of Lorazepam once daily and see if this helps until you see Dr. Casimiro Needle.   You will receive a call from [place].   We will forward your cholesterol result and refill your medication.   Have Dr. Ubaldo Glassing look at the spot on your foot please

## 2017-08-07 NOTE — Progress Notes (Signed)
   Subjective:    Patient ID: Brandi Stephens, female    DOB: 1945/05/22, 72 y.o.   MRN: [id number]  HPI Chief Complaint  Patient presents with  . follow-up    follow-up on aneixty and hemorrhoids,   She is here for a follow up of constipation, hemorrhoids and anxiety.  Taking Linzess and having success with this. States she is having loose stools actually. She is pleased with her results.   Hemorrhoids have improved since our last visit. She has a history of hemorrhoid surgery 10 years ago or more.  States she needs a referral to GI since she is due for her colonoscopy also.    Anxiety- taking lorazepam 0.5 mg and questions if she can take 1 mg. Notes some improvement but not enough. States she used to take this dose in the past. States she has a psychiatrist appointment with Dr. Casimiro Needle in May.   States she has a skin lesion on the top of her left foot that has been present and growing over the past year. She has an appointment with her dermatologist, Dr. Ubaldo Glassing on May 1st.   Denies fever, chills, dizziness, chest pain, palpitations, shortness of breath, abdominal pain, N/V/, urinary symptoms.   Reviewed allergies, medications, past medical, surgical, family, and social history.    Review of Systems Pertinent positives and negatives in the history of present illness.     Objective:   Physical Exam BP 120/80   Pulse 89   Ht 5\' 7"  (1.702 m)   Wt 234 lb 6.4 oz (106.3 kg)   BMI 36.71 kg/m   Alert and oriented and in no acute distress. Prolapsed hemorrhoids without thrombosis. Severe TTP, internal exam not done due to patient discomfort. Left foot with a large, round circular red lesion with edges raised on the dorsal aspect. Non pruritic and non tender.       Assessment & Plan:  Generalized anxiety disorder  Constipation, unspecified constipation type - Plan: Ambulatory referral to Gastroenterology  External hemorrhoid - Plan: Ambulatory referral to  Gastroenterology  Skin lesion  Mixed hyperlipidemia - Plan: Lipid panel  She may increase her current dose of lorazapam to 1 mg once daily if needed. She will see Dr. Casimiro Needle for further evaluation.  Linzess has been working for her but offered a lower dose since she is experiencing loose stools now. She declines and states this is helping her hemorrhoids. Would like to continue on current dose.  Referral to GI for chronic constipation and need for colonoscopy. She would also like to have GI evaluate her hemorrhoids. Discussed that she needs a surgery consult for this and she would like to hold off for now.  She is fasting and overdue for lipid panel. Will refill medication pending lipid results.  She will see her dermatologist for skin lesion on May 1st.  Follow up pending labs.

## 2017-08-08 ENCOUNTER — Encounter: Payer: Self-pay | Admitting: Gastroenterology

## 2017-08-08 ENCOUNTER — Other Ambulatory Visit: Payer: Self-pay | Admitting: Oncology

## 2017-08-08 ENCOUNTER — Telehealth: Payer: Self-pay | Admitting: Oncology

## 2017-08-08 ENCOUNTER — Telehealth: Payer: Self-pay

## 2017-08-08 LAB — LIPID PANEL
Chol/HDL Ratio: 5 ratio — ABNORMAL HIGH (ref 0.0–4.4)
Cholesterol, Total: 210 mg/dL — ABNORMAL HIGH (ref 100–199)
HDL: 42 mg/dL (ref >39)
LDL CALC: 118 mg/dL — AB (ref 0–99)
Triglycerides: 252 mg/dL — ABNORMAL HIGH (ref 0–149)
VLDL CHOLESTEROL CAL: 50 mg/dL — AB (ref 5–40)

## 2017-08-08 NOTE — Telephone Encounter (Signed)
Patient called to reschedule  °

## 2017-08-08 NOTE — Telephone Encounter (Signed)
Pt left VM regarding she has been off Anastrozole for over a month Dr Jana Hakim aware, pt was seeing if being on the med had anything to due with tooth decay and constipation.  Pt reports not much improvement in symptoms so she is ready to start back on the Anastrozole.  Reports she is seeing GI doctor and is now taking Linzess for constipation Notified Dr Jana Hakim and called pt back and per him, she can resume taking Anastrozole as previously prescribed.  Pt voiced understanding and no other needs at this time.

## 2017-08-09 ENCOUNTER — Other Ambulatory Visit: Payer: Self-pay | Admitting: Family Medicine

## 2017-08-09 DIAGNOSIS — E782 Mixed hyperlipidemia: Secondary | ICD-10-CM

## 2017-08-09 MED ORDER — ATORVASTATIN CALCIUM 20 MG PO TABS: 20.0000 mg | ORAL_TABLET | Freq: Every day | ORAL | 0 refills | Status: DC

## 2017-08-16 DIAGNOSIS — L920 Granuloma annulare: Secondary | ICD-10-CM | POA: Diagnosis not present

## 2017-08-16 DIAGNOSIS — L905 Scar conditions and fibrosis of skin: Secondary | ICD-10-CM | POA: Diagnosis not present

## 2017-08-16 DIAGNOSIS — L821 Other seborrheic keratosis: Secondary | ICD-10-CM | POA: Diagnosis not present

## 2017-08-16 DIAGNOSIS — Z85828 Personal history of other malignant neoplasm of skin: Secondary | ICD-10-CM | POA: Diagnosis not present

## 2017-08-17 ENCOUNTER — Other Ambulatory Visit: Payer: Self-pay | Admitting: Oncology

## 2017-08-17 ENCOUNTER — Other Ambulatory Visit: Payer: Self-pay

## 2017-08-17 DIAGNOSIS — Z853 Personal history of malignant neoplasm of breast: Secondary | ICD-10-CM

## 2017-08-20 IMAGING — CR DG CHEST 2V
2 series · 2 of 2 positions shown · non-contrast
Comparison: 03/23/2015.

CLINICAL DATA: Cough for the past month.  Ex-smoker.

EXAM:
CHEST  2 VIEW

[w chest pa]
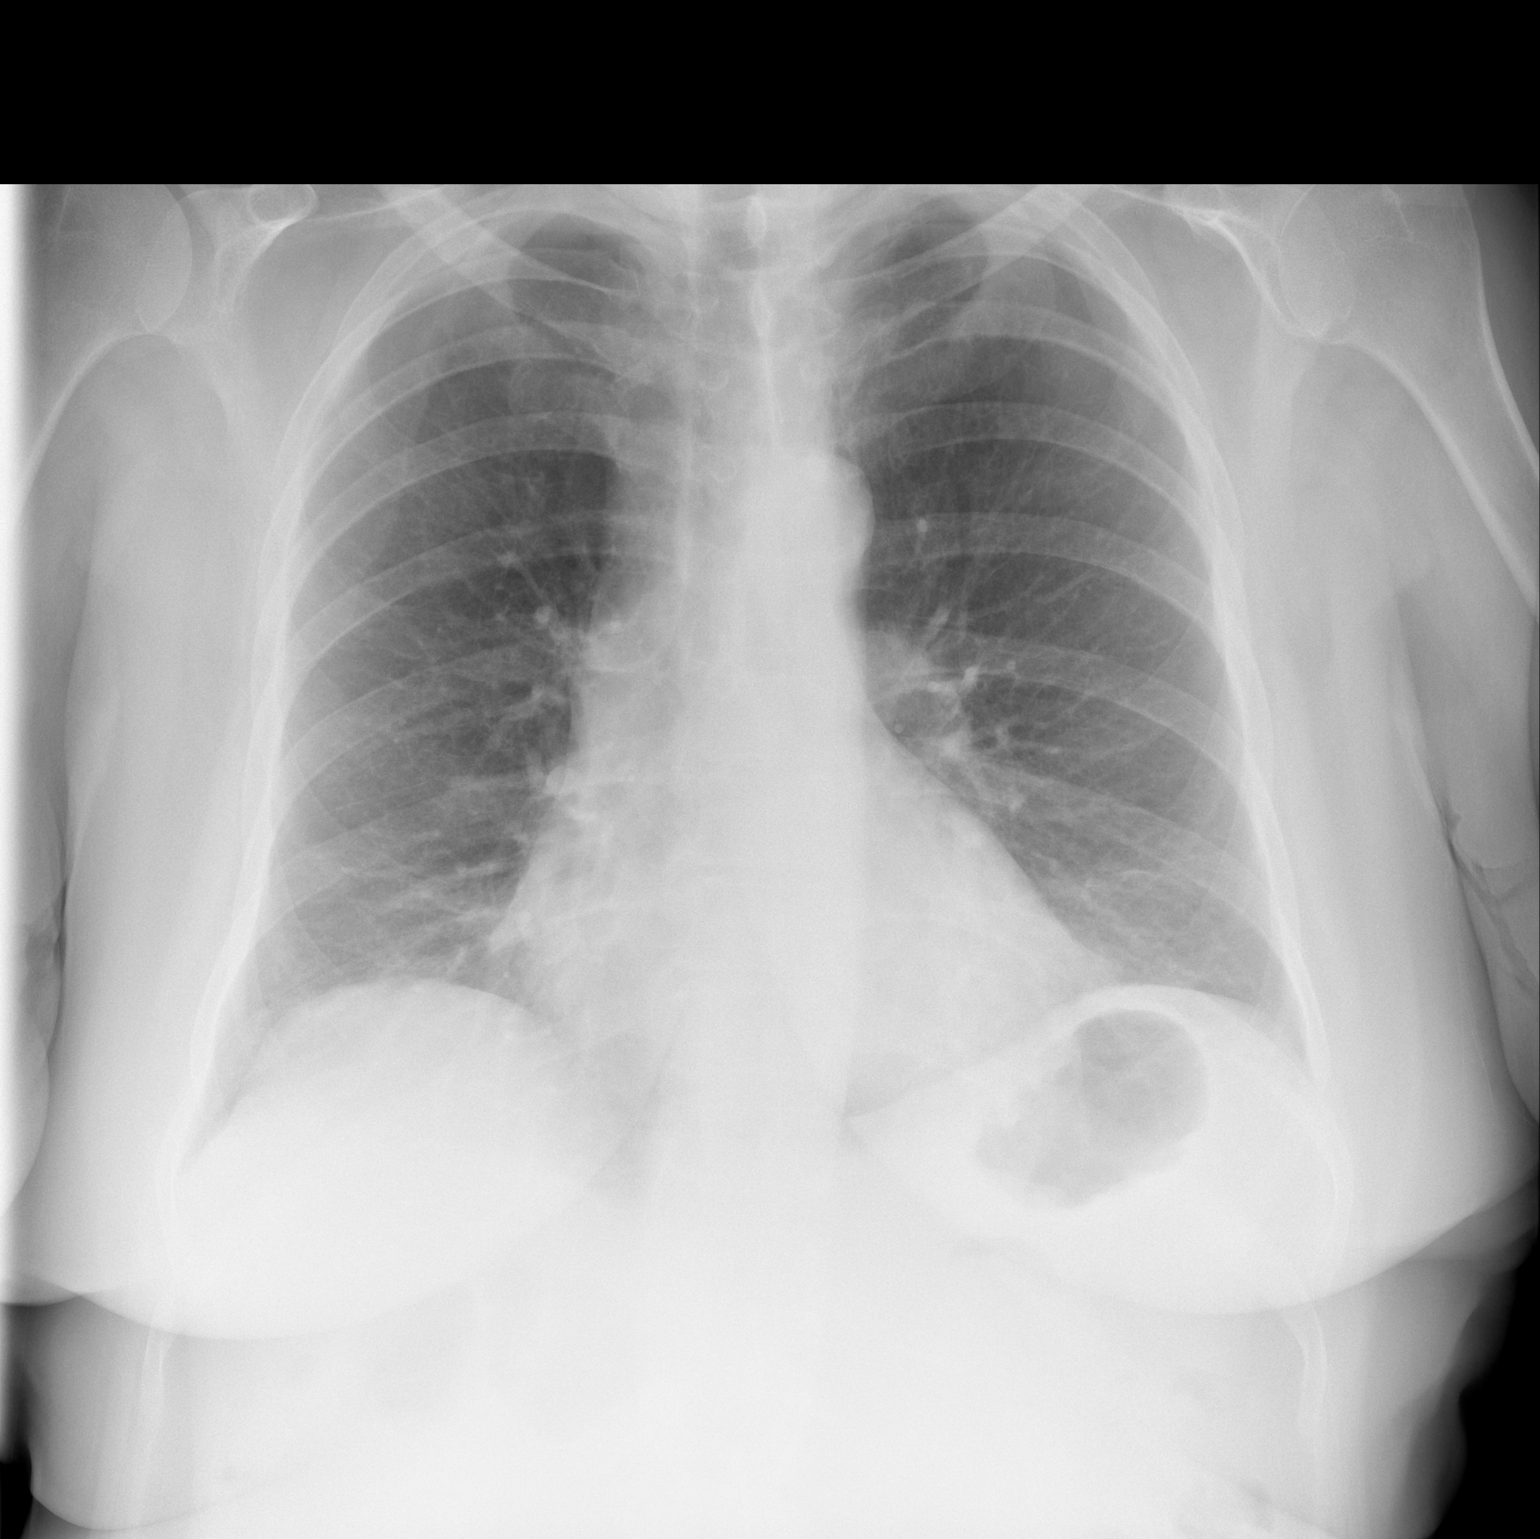

[w chest lat]
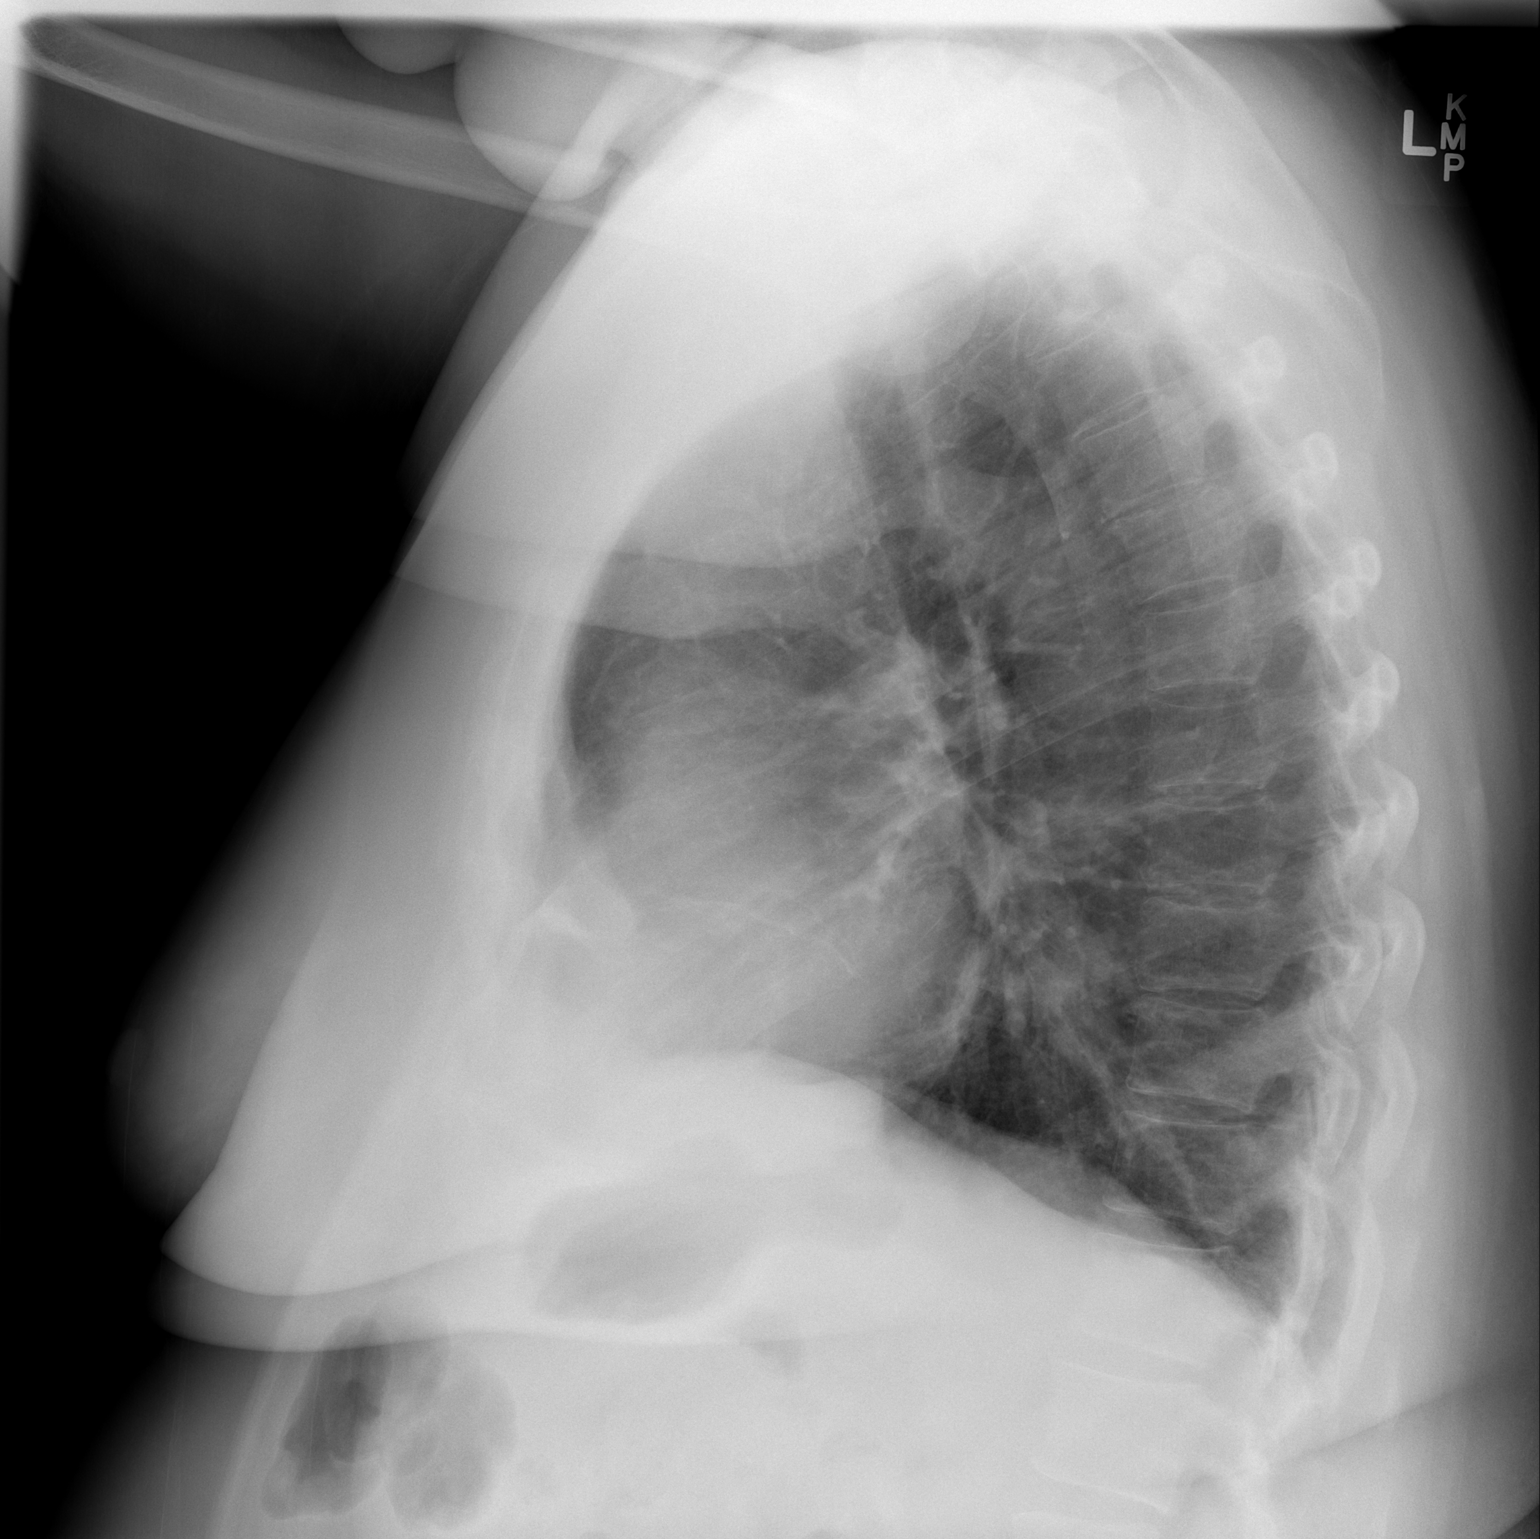

[2 of 2 positions shown; findings below may reference images not displayed]

FINDINGS: Normal sized heart. Clear lungs. Minimal diffuse peribronchial
thickening. Unremarkable bones.
IMPRESSION: Minimal bronchitic changes.

## 2017-08-21 ENCOUNTER — Ambulatory Visit
Admission: RE | Admit: 2017-08-21 | Discharge: 2017-08-21 | Disposition: A | Discharge status: Home / Self Care | Payer: PPO | Source: Ambulatory Visit | Attending: Oncology | Admitting: Oncology

## 2017-08-21 DIAGNOSIS — R922 Inconclusive mammogram: Secondary | ICD-10-CM | POA: Diagnosis not present

## 2017-08-21 DIAGNOSIS — Z853 Personal history of malignant neoplasm of breast: Secondary | ICD-10-CM

## 2017-08-23 DIAGNOSIS — L270 Generalized skin eruption due to drugs and medicaments taken internally: Secondary | ICD-10-CM | POA: Diagnosis not present

## 2017-08-23 DIAGNOSIS — Z85828 Personal history of other malignant neoplasm of skin: Secondary | ICD-10-CM | POA: Diagnosis not present

## 2017-08-24 ENCOUNTER — Other Ambulatory Visit: Payer: Self-pay | Admitting: Family Medicine

## 2017-08-28 DIAGNOSIS — Z170 Estrogen receptor positive status [ER+]: Secondary | ICD-10-CM | POA: Diagnosis not present

## 2017-08-28 DIAGNOSIS — C50212 Malignant neoplasm of upper-inner quadrant of left female breast: Secondary | ICD-10-CM | POA: Diagnosis not present

## 2017-09-01 DIAGNOSIS — F4321 Adjustment disorder with depressed mood: Secondary | ICD-10-CM | POA: Diagnosis not present

## 2017-09-11 ENCOUNTER — Other Ambulatory Visit: Payer: Self-pay | Admitting: Family Medicine

## 2017-09-11 ENCOUNTER — Other Ambulatory Visit: Payer: Self-pay | Admitting: Neurology

## 2017-09-11 DIAGNOSIS — G2581 Restless legs syndrome: Secondary | ICD-10-CM

## 2017-09-19 DIAGNOSIS — M1712 Unilateral primary osteoarthritis, left knee: Secondary | ICD-10-CM | POA: Diagnosis not present

## 2017-09-25 ENCOUNTER — Ambulatory Visit: Payer: PPO | Admitting: Oncology

## 2017-09-25 ENCOUNTER — Other Ambulatory Visit: Payer: PPO

## 2017-10-04 ENCOUNTER — Other Ambulatory Visit: Payer: PPO

## 2017-10-04 ENCOUNTER — Ambulatory Visit: Payer: PPO | Admitting: Oncology

## 2017-10-05 ENCOUNTER — Ambulatory Visit (INDEPENDENT_AMBULATORY_CARE_PROVIDER_SITE_OTHER): Payer: PPO | Admitting: Gastroenterology

## 2017-10-05 ENCOUNTER — Encounter: Payer: Self-pay | Admitting: Gastroenterology

## 2017-10-05 VITALS — BP 138/76 | HR 100 | Ht 66.5 in | Wt 229.5 lb

## 2017-10-05 DIAGNOSIS — K649 Unspecified hemorrhoids: Secondary | ICD-10-CM | POA: Diagnosis not present

## 2017-10-05 DIAGNOSIS — K5909 Other constipation: Secondary | ICD-10-CM | POA: Diagnosis not present

## 2017-10-05 DIAGNOSIS — Z86010 Personal history of colonic polyps: Secondary | ICD-10-CM

## 2017-10-05 NOTE — Patient Instructions (Addendum)
If you are age 71 or older, your body mass index should be between 23-30. Your Body mass index is 36.49 kg/m. If this is out of the aforementioned range listed, please consider follow up with your Primary Care Provider.  If you are age 64 or younger, your body mass index should be between 19-25. Your Body mass index is 36.49 kg/m. If this is out of the aformentioned range listed, please consider follow up with your Primary Care Provider.   It has been recommended to you by your physician that you have a(n) colonoscopy completed. Per your request, we did not schedule the procedure(s) today. Please contact our office at [phone] when you decide to have the procedure completed. The September schedule should be available in mid July.  Please continue taking Linzess.  Thank you for entrusting me with your care and for choosing [facility], Dr. [place] Cellar

## 2017-10-05 NOTE — Progress Notes (Signed)
HPI :  72 y/o female with a history of breast cancer, COPD, colon polyps, referred here by Thalia Bloodgood for new patient visit for hemorrhoids.  The patient states she has chronic constipation at baseline. She frequently had infrequent stools with straining. She states over time this is caused problems with hemorrhoids which can prolapse and become irritated and can bleed. She has a remote history of a hemorrhoidectomy. She has been on a variety regimens for constipation however recently was placed on Linzess dosed at 145 g a day and this is been able to produce a bowel movement once every day. More recently she was using magnesium citrate on a daily basis which did not work as well. She is fairly happy with Linzess since she started it and this is reduced irritation of her hemorrhoids. She has not had too much bleeding recently from this issue.  He does endorse a history of having a right sided colectomy due to large polyp/polyp burden in the past. Her most recent colonoscopy was in 2014 at which point she had a 10 mm transverse sessile serrated polyp removed. She is overdue for colonoscopy at this time.  Colonoscopy 01/2013 - 41mm transverse sessile serrated polyp, a few small other sigmoid hyperplastic polyps,   Past Medical History:  Diagnosis Date  . Anxiety   . Arthritis   . Breast cancer ([place])    left breast   . Breast cancer of lower-outer quadrant of right female breast ([place]) 07/14/2015  . COPD (chronic obstructive pulmonary disease) ([place])   . Hemorrhoids   . Hypertension   . Hypothyroidism   . Personal history of radiation therapy   . PONV (postoperative nausea and vomiting)   . Prediabetes   . Sepsis ([place]) 04/2015   following lumbar spine surgery     Past Surgical History:  Procedure Laterality Date  . ABDOMINAL HYSTERECTOMY  1985  . BASAL CELL CARCINOMA EXCISION  10/15   nose  . BREAST BIOPSY Right 08/24/2015  . BREAST BIOPSY Right 07/13/2015  . BREAST BIOPSY Left  07/13/2015  . BREAST EXCISIONAL BIOPSY Left 1972  . BREAST LUMPECTOMY WITH RADIOACTIVE SEED AND SENTINEL LYMPH NODE BIOPSY Left 09/16/2015   Procedure: BREAST LUMPECTOMY WITH RADIOACTIVE SEED AND SENTINEL LYMPH NODE BIOPSY;  Surgeon: Stark Klein, MD;  Location: [facility];  Service: General;  Laterality: Left;  . BREAST REDUCTION SURGERY  1998  . [place]   hallo  . COLECTOMY  2005   right side  . EYE SURGERY Left 07/2014   cataract removed 3 months later  . HAND SURGERY Left   . [place]  2008  . KNEE ARTHROSCOPY Right 2002  . LUMBAR SPINE SURGERY  03/2015   2 metal rods  . REDUCTION MAMMAPLASTY Bilateral 1996  . ROTATOR CUFF REPAIR Left 04/2016  . TONSILLECTOMY    . TUBAL LIGATION     Family History  Problem Relation Age of Onset  . Atrial fibrillation Mother   . Breast cancer Maternal Aunt   . Lung cancer Maternal Uncle   . Stomach cancer Maternal Grandmother   . Lung cancer Maternal Aunt   . Brain cancer Maternal Aunt   . Prostate cancer Maternal Grandfather   . Stroke Paternal Grandmother   . Cancer Paternal Grandfather   . Colon cancer Neg Hx   . Esophageal cancer Neg Hx   . Rectal cancer Neg Hx    Social History   Tobacco Use  . Smoking  status: Former Smoker    Packs/day: 1.00    Years: 35.00    Pack years: 35.00    Types: Cigarettes    Last attempt to quit: 09/04/2017    Years since quitting: 0.0  . Smokeless tobacco: Never Used  Substance Use Topics  . Alcohol use: Yes    Alcohol/week: 0.6 oz    Types: 1 Glasses of wine per week    Comment: 1 drink nightly  . Drug use: No   Current Outpatient Medications  Medication Sig Dispense Refill  . allopurinol (ZYLOPRIM) 100 MG tablet TAKE TWO TABLETS BY MOUTH DAILY 180 tablet 0  . anastrozole (ARIMIDEX) 1 MG tablet TAKE ONE TABLET BY MOUTH DAILY 90 tablet 2  . atorvastatin (LIPITOR) 20 MG tablet Take 1 tablet (20 mg total) by mouth daily. 90 tablet 0  . baclofen  (LIORESAL) 10 MG tablet Take 10 mg by mouth 3 (three) times daily as needed.     Marland Kitchen BIOTIN 5000 PO Take 1 capsule by mouth daily.     . busPIRone (BUSPAR) 10 MG tablet Take one before lunch and 2 before bedtime    . Calcium Carbonate-Vitamin D (CALCIUM 500/D PO) Take by mouth.    . Cholecalciferol (D3 VITAMIN PO) Take by mouth.    . colchicine 0.6 MG tablet Take 2 tablets now and 1 tablet an hour later for flare up 6 tablet 0  . furosemide (LASIX) 20 MG tablet Take 20 mg by mouth daily as needed. Reported on 10/28/2015    . KLOR-CON 10 10 MEQ tablet Take 10 mEq by mouth as needed. Reported on 10/28/2015    . levothyroxine (SYNTHROID, LEVOTHROID) 75 MCG tablet TAKE ONE TABLET BY MOUTH DAILY 90 tablet 1  . linaclotide (LINZESS) 145 MCG CAPS capsule Take 1 capsule (145 mcg total) by mouth daily before breakfast. 30 capsule 1  . LORazepam (ATIVAN) 0.5 MG tablet Take 1 tablet (0.5 mg total) by mouth 2 (two) times daily as needed for anxiety. 30 tablet 0  . MAGNESIUM CITRATE PO Take 2 tablets by mouth at bedtime. Reported on 09/29/2015    . Melatonin-Pyridoxine (MELATONIN-B6 PO) Take 1 tablet by mouth at bedtime.    . metoprolol succinate (TOPROL-XL) 25 MG 24 hr tablet Take 0.5 tablets (12.5 mg total) by mouth daily. 45 tablet 1  . olmesartan-hydrochlorothiazide (BENICAR HCT) 40-12.5 MG tablet Take 1 tablet by mouth daily. 90 tablet 1  . rOPINIRole (REQUIP) 0.5 MG tablet TAKE ONE TABLET BY MOUTH AT BEDTIME 60 tablet 0  . traMADol (ULTRAM) 50 MG tablet TAKE ONE TABLET BY MOUTH EVERY 6 HOURS AS NEEDED 90 tablet 0   No current facility-administered medications for this visit.    Allergies  Allergen Reactions  . Ampicillin Hives    Severe reaction in February 2017 1.5 month to have hives to go away  . Codeine Nausea And Vomiting  . Morphine And Related Itching  . Penicillins Other (See Comments)    Has patient had a PCN reaction causing immediate rash, facial/tongue/throat swelling, SOB or  lightheadedness with hypotension: No Has patient had a PCN reaction causing severe rash involving mucus membranes or skin necrosis: No Has patient had a PCN reaction that required hospitalization No Has patient had a PCN reaction occurring within the last 10 years: No If all of the above answers are "NO", then may proceed with Cephalosporin use.      Review of Systems: All systems reviewed and negative except where noted in HPI.  Lab Results  Component Value Date   WBC 10.5 07/26/2017   HGB 16.4 (H) 07/26/2017   HCT 47.4 (H) 07/26/2017   MCV 97 07/26/2017   PLT 227 07/26/2017    Lab Results  Component Value Date   CREATININE 0.93 07/26/2017   BUN 19 07/26/2017   NA 141 07/26/2017   K 5.2 07/26/2017   CL 103 07/26/2017   CO2 22 07/26/2017    Lab Results  Component Value Date   ALT 17 07/26/2017   AST 16 07/26/2017   ALKPHOS 102 07/26/2017   BILITOT 0.2 07/26/2017     Physical Exam: BP 138/76   Pulse 100   Ht 5' 6.5" (1.689 m) Comment: WITHOUT SHOES  Wt 229 lb 8 oz (104.1 kg)   BMI 36.49 kg/m  Constitutional: Pleasant,well-developed, female in no acute distress. HEENT: Normocephalic and atraumatic. Conjunctivae are normal. No scleral icterus. Neck supple.  Cardiovascular: Normal rate, regular rhythm.  Pulmonary/chest: Effort normal and breath sounds normal. No wheezing, rales or rhonchi. Abdominal: Soft, nondistended, nontender.  There are no masses palpable. No hepatomegaly. DRE - deferred to timing of colonoscopy Extremities: no edema Lymphadenopathy: No cervical adenopathy noted. Neurological: Alert and oriented to person place and time. Skin: Skin is warm and dry. No rashes noted. Psychiatric: Normal mood and affect. Behavior is normal.   ASSESSMENT AND PLAN: 72 year old female with history as outlined above, referred for issues as outlined below:  Hemorrhoids / chronic constipation - irritated and bleeding hemorrhoids likely as a result of chronic  constipation. We discussed options for management. She most recently has been placed on Linzess 145 g once daily which is working quite well for her. She thinks with management of constipation her hemorrhoids are not bothering her as much which is excellent. She will continue this regimen for now as it seems to be working. If she has issues with hemorrhoids moving forward in regards to irritation or bleeding, we can consider hemorrhoid banding. She was agreeable with this plan. She'll let me know if she needs further hemorrhoid therapy moving forward.  History of colon polyps - 1 cm sessile serrated adenoma removed 5 years ago, she is overdue for surveillance at this time. I discussed the risk and benefits of colonoscopy with her. Following this discussion she wanted to proceed. If she continues to have bleeding from hemorrhoids despite management of constipation, and thought to be a good candidate for banding after colonoscopy we may consider doing that.  [place] Cellar, MD [facility]  CC: Girtha Rm, NP-C

## 2017-10-17 ENCOUNTER — Other Ambulatory Visit: Payer: Self-pay | Admitting: Neurology

## 2017-10-18 ENCOUNTER — Other Ambulatory Visit: Payer: Self-pay | Admitting: Neurology

## 2017-10-20 DIAGNOSIS — F4321 Adjustment disorder with depressed mood: Secondary | ICD-10-CM | POA: Diagnosis not present

## 2017-10-28 ENCOUNTER — Other Ambulatory Visit: Payer: Self-pay | Admitting: Family Medicine

## 2017-10-28 DIAGNOSIS — K5900 Constipation, unspecified: Secondary | ICD-10-CM

## 2017-10-31 ENCOUNTER — Other Ambulatory Visit: Payer: PPO

## 2017-10-31 ENCOUNTER — Encounter: Payer: Self-pay | Admitting: Gastroenterology

## 2017-10-31 DIAGNOSIS — E782 Mixed hyperlipidemia: Secondary | ICD-10-CM

## 2017-11-01 DIAGNOSIS — M1712 Unilateral primary osteoarthritis, left knee: Secondary | ICD-10-CM | POA: Diagnosis not present

## 2017-11-01 LAB — LIPID PANEL
Chol/HDL Ratio: 4.5 ratio — ABNORMAL HIGH (ref 0.0–4.4)
Cholesterol, Total: 184 mg/dL (ref 100–199)
HDL: 41 mg/dL (ref >39)
LDL CALC: 90 mg/dL (ref 0–99)
Triglycerides: 267 mg/dL — ABNORMAL HIGH (ref 0–149)
VLDL Cholesterol Cal: 53 mg/dL — ABNORMAL HIGH (ref 5–40)

## 2017-11-06 ENCOUNTER — Encounter

## 2017-11-06 ENCOUNTER — Ambulatory Visit: Payer: PPO | Admitting: Neurology

## 2017-11-06 NOTE — Progress Notes (Signed)
Brandi Stephens Stephens  [phone] [phone]) [phone]     ID: Brandi Stephens Stephens DOB: Nov 03, 1945  MR#: [id number]  PVX#:[id number]  Patient Care Team: Girtha Rm, NP-C as PCP - General (Family Medicine) Vikki Gains, Virgie Dad, MD as Consulting Physician (Oncology) Stark Klein, MD as Consulting Physician (General Surgery) Kyung Rudd, MD as Consulting Physician (Radiation Oncology) [name], MD as Consulting Physician (Family Medicine) [name], MD as Consulting Physician (Neurosurgery) Griselda Miner, MD as Consulting Physician (Dermatology) Delice Bison, Charlestine Massed, NP as Nurse Practitioner (Hematology and Oncology) Marchia Bond, MD as Consulting Physician (Orthopedic Surgery) Clydell Hakim, MD as Consulting Physician (Anesthesiology) PCP: Girtha Rm, NP-C GYN: OTHER MD:  CHIEF COMPLAINT: Estrogen receptor positive breast cancer  CURRENT TREATMENT: Anastrozole   BREAST CANCER HISTORY: From the original intake note:  Brandi Stephens Stephens started to feel some right breast pain and soreness in March 2017. She brought it to Dr. Lanice Shirts nurse practitioner, Loletha Carrow and since attention, and she was set up for bilateral diagnostic mammography with tomography at the [facility] 07/02/2015 area did the breast density was category C. The patient is status post bilateral reduction mammoplasties. Mammography showed no suspicious masses or calcifications in the right breast, and ultrasonography of the area where she has discomfort shows an area of distortion in the right breast at the 8:00 position 12 cm from the nipple measuring 1.4 cm thought to represent scarring related to the prior reduction surgery, but warranting further evaluation.  Also, in the left breast there was an area of distortion in the upper inner quadrant noted mammographically. Targeted ultrasound evaluating the left breast found no sonographic correlate to the mammographic findings.  Ultrasound evaluation of both axillae were benign.  On 07/13/2015 [name] underwent biopsy of the right breast area in question which showed only fibrocystic changes with adenosis. Biopsy of the left breast upper inner quadrant however showed invasive ductal carcinoma, grade 1, estrogen receptor 90% positive, progesterone receptor 70% positive, WITH strong staining intensity, with an MIB-1 of 3%, and no HER-2 amplification, the signals ratio being 0.97 and the number per cell 1.65.  The patient's subsequent history is as detailed below  INTERVAL HISTORY: Brandi Stephens Stephens returns today for follow-up of her estrogen receptor positive breast cancer. She continues on anastrozole, with good tolerance. She temporarily stopped taking this for about 1 month because of dental concerns, which were of course not related. She had less intense and fewer hot flashes while off the medication. She denies issues with vaginal dryness. She notes that she didn't feel much of a difference altogether. She has resumed the anastrozole without further issues.   Since her last visit, she underwent diagnostic bilateral mammography with CAD and tomography on 08/21/2017 at [facility] showing: breast density category C. There was no evidence of malignancy.   REVIEW OF SYSTEMS: Brandi Stephens Stephens reports that she is doing well today. She notes that she has had bone grafts and oral surgeries. She walks for exercise.  She denies unusual headaches, visual changes, nausea, vomiting, or dizziness. There has been no unusual cough, phlegm production, or pleurisy. This been no change in bowel or bladder habits. She denies unexplained fatigue or unexplained weight loss, bleeding, rash, or fever. A detailed review of systems was otherwise stable.    PAST MEDICAL HISTORY: Past Medical History:  Diagnosis Date  . Anxiety   . Arthritis   . Breast cancer ([place])    left breast   . Breast cancer of lower-outer quadrant of right female breast (  [place])  07/14/2015  . COPD (chronic obstructive pulmonary disease) ([place])   . Hemorrhoids   . Hypertension   . Hypothyroidism   . Personal history of radiation therapy   . PONV (postoperative nausea and vomiting)   . Prediabetes   . Sepsis ([place]) 04/2015   following lumbar spine surgery    PAST SURGICAL HISTORY: Past Surgical History:  Procedure Laterality Date  . ABDOMINAL HYSTERECTOMY  1985  . BASAL CELL CARCINOMA EXCISION  10/15   nose  . BREAST BIOPSY Right 08/24/2015  . BREAST BIOPSY Right 07/13/2015  . BREAST BIOPSY Left 07/13/2015  . BREAST EXCISIONAL BIOPSY Left 1972  . BREAST LUMPECTOMY WITH RADIOACTIVE SEED AND SENTINEL LYMPH NODE BIOPSY Left 09/16/2015   Procedure: BREAST LUMPECTOMY WITH RADIOACTIVE SEED AND SENTINEL LYMPH NODE BIOPSY;  Surgeon: Stark Klein, MD;  Location: [facility];  Service: General;  Laterality: Left;  . BREAST REDUCTION SURGERY  1998  . [place]   hallo  . COLECTOMY  2005   right side  . EYE SURGERY Left 07/2014   cataract removed 3 months later  . HAND SURGERY Left   . [place]  2008  . KNEE ARTHROSCOPY Right 2002  . LUMBAR SPINE SURGERY  03/2015   2 metal rods  . REDUCTION MAMMAPLASTY Bilateral 1996  . ROTATOR CUFF REPAIR Left 04/2016  . TONSILLECTOMY    . TUBAL LIGATION      FAMILY HISTORY Family History  Problem Relation Age of Onset  . Atrial fibrillation Mother   . Breast cancer Maternal Aunt   . Lung cancer Maternal Uncle   . Stomach cancer Maternal Grandmother   . Lung cancer Maternal Aunt   . Brain cancer Maternal Aunt   . Prostate cancer Maternal Grandfather   . Stroke Paternal Grandmother   . Cancer Paternal Grandfather   . Colon cancer Neg Hx   . Esophageal cancer Neg Hx   . Rectal cancer Neg Hx   The patient's father died at the age of 19 and her mother at the age of 56. The patient had no brothers, 2 sisters. The patient's mother was diagnosed with uterine cancer at the age of 23.  The patient's sister was diagnosed with labial cancer at the age of 25. There is a maternal aunt with a history of breast cancer but the patient does not know at what age she was diagnosed. There is also on the mother's side history of lung cancer brain cancer and stomach cancer.   GYNECOLOGIC HISTORY:  No LMP recorded. Patient has had a hysterectomy.  Menarche age 22 first live birth age 38, the patient is [place] P3. She underwent hysterectomy without salpingo-oophorectomy at age 38. She took hormone replacement only for a few months.   SOCIAL HISTORY:   the patient is a retired Architect. Her husband on is also retired. He used to work for [employer]. Daughter [name] lives in [place] and she is a Wellsite geologist, currently not employed. Daughter [name] lives in [place] and has a Brewing technologist in social work but is not currently working. Son [name] died at age 31 from causes not clear after autopsy. He had had significant neurologic damage following an accident     ADVANCED DIRECTIVES:  not in place    HEALTH MAINTENANCE: Social History   Tobacco Use  . Smoking status: Former Smoker    Packs/day: 1.00    Years: 35.00    Pack years: 35.00  Types: Cigarettes    Last attempt to quit: 09/04/2017    Years since quitting: 0.1  . Smokeless tobacco: Never Used  Substance Use Topics  . Alcohol use: Yes    Alcohol/week: 0.6 oz    Types: 1 Glasses of wine per week    Comment: 1 drink nightly  . Drug use: No     Colonoscopy: 2015/[facility]   PAP:  Bone density: 2016  Lipid panel:  Allergies  Allergen Reactions  . Ampicillin Hives    Severe reaction in February 2017 1.5 month to have hives to go away  . Codeine Nausea And Vomiting  . Morphine And Related Itching  . Penicillins Other (See Comments)    Has patient had a PCN reaction causing immediate rash, facial/tongue/throat swelling, SOB or lightheadedness with hypotension: No Has patient had a PCN reaction causing  severe rash involving mucus membranes or skin necrosis: No Has patient had a PCN reaction that required hospitalization No Has patient had a PCN reaction occurring within the last 10 years: No If all of the above answers are "NO", then may proceed with Cephalosporin use.     Current Outpatient Medications  Medication Sig Dispense Refill  . allopurinol (ZYLOPRIM) 100 MG tablet TAKE TWO TABLETS BY MOUTH DAILY 180 tablet 0  . anastrozole (ARIMIDEX) 1 MG tablet TAKE ONE TABLET BY MOUTH DAILY 90 tablet 2  . atorvastatin (LIPITOR) 20 MG tablet Take 1 tablet (20 mg total) by mouth daily. 90 tablet 0  . baclofen (LIORESAL) 10 MG tablet Take 10 mg by mouth 3 (three) times daily as needed.     Marland Kitchen BIOTIN 5000 PO Take 1 capsule by mouth daily.     . busPIRone (BUSPAR) 10 MG tablet Take one before lunch and 2 before bedtime    . Calcium Carbonate-Vitamin D (CALCIUM 500/D PO) Take by mouth.    . Cholecalciferol (D3 VITAMIN PO) Take by mouth.    . colchicine 0.6 MG tablet Take 2 tablets now and 1 tablet an hour later for flare up 6 tablet 0  . furosemide (LASIX) 20 MG tablet Take 20 mg by mouth daily as needed. Reported on 10/28/2015    . KLOR-CON 10 10 MEQ tablet Take 10 mEq by mouth as needed. Reported on 10/28/2015    . levothyroxine (SYNTHROID, LEVOTHROID) 75 MCG tablet TAKE ONE TABLET BY MOUTH DAILY 90 tablet 1  . LINZESS 145 MCG CAPS capsule TAKE ONE CAPSULE BY MOUTH DAILY BEFORE BREAKFAST 30 capsule 0  . LORazepam (ATIVAN) 0.5 MG tablet Take 1 tablet (0.5 mg total) by mouth 2 (two) times daily as needed for anxiety. 30 tablet 0  . MAGNESIUM CITRATE PO Take 2 tablets by mouth at bedtime. Reported on 09/29/2015    . Melatonin-Pyridoxine (MELATONIN-B6 PO) Take 1 tablet by mouth at bedtime.    . metoprolol succinate (TOPROL-XL) 25 MG 24 hr tablet Take 0.5 tablets (12.5 mg total) by mouth daily. 45 tablet 1  . olmesartan-hydrochlorothiazide (BENICAR HCT) 40-12.5 MG tablet Take 1 tablet by mouth daily. 90  tablet 1  . rOPINIRole (REQUIP) 0.5 MG tablet TAKE ONE TABLET BY MOUTH AT BEDTIME 60 tablet 0  . traMADol (ULTRAM) 50 MG tablet TAKE ONE TABLET BY MOUTH EVERY 6 HOURS AS NEEDED 90 tablet 0   No current facility-administered medications for this visit.     OBJECTIVE: Middle-aged white woman in no acute distress  Vitals:   11/07/17 1351  BP: 120/80  Pulse: 76  Resp: 18  Temp:  98.9 F (37.2 C)  SpO2: 99%     Body mass index is 37.84 kg/m.    ECOG FS:1 - Symptomatic but completely ambulatory Filed Weights   11/07/17 1351  Weight: 238 lb (108 kg)   Sclerae unicteric, pupils round and equal Oropharynx clear and moist No cervical or supraclavicular adenopathy Lungs no rales or rhonchi; clothes smell of tobacco Heart regular rate and rhythm Abd soft, nontender, positive bowel sounds MSK status post lumbar spinal surgery, but no focal spinal tenderness, no upper extremity lymphedema Neuro: nonfocal, well oriented, appropriate affect Breasts: Right breast is benign.  The left breast has undergone lumpectomy followed by radiation.  There is no evidence of disease recurrence.  Both axillae are benign.    LAB RESULTS:  CMP     Component Value Date/Time   NA 141 07/26/2017 1454   NA 142 06/28/2016 1458   K 5.2 07/26/2017 1454   K 5.2 (H) 06/28/2016 1458   CL 103 07/26/2017 1454   CO2 22 07/26/2017 1454   CO2 24 06/28/2016 1458   GLUCOSE 116 (H) 07/26/2017 1454   GLUCOSE 116 (H) 07/08/2016 1042   GLUCOSE 114 06/28/2016 1458   BUN 19 07/26/2017 1454   BUN 23.6 06/28/2016 1458   CREATININE 0.93 07/26/2017 1454   CREATININE 0.97 07/08/2016 1042   CREATININE 1.0 06/28/2016 1458   CALCIUM 9.5 07/26/2017 1454   CALCIUM 10.3 06/28/2016 1458   PROT 6.4 07/26/2017 1454   PROT 6.8 06/28/2016 1458   ALBUMIN 4.3 07/26/2017 1454   ALBUMIN 3.6 06/28/2016 1458   AST 16 07/26/2017 1454   AST 18 06/28/2016 1458   ALT 17 07/26/2017 1454   ALT 22 06/28/2016 1458   ALKPHOS 102  07/26/2017 1454   ALKPHOS 101 06/28/2016 1458   BILITOT 0.2 07/26/2017 1454   BILITOT 0.48 06/28/2016 1458   GFRNONAA 62 07/26/2017 1454   GFRAA 72 07/26/2017 1454    INo results found for: SPEP, UPEP  Lab Results  Component Value Date   WBC 8.6 11/07/2017   NEUTROABS 5.9 11/07/2017   HGB 12.5 11/07/2017   HCT 37.6 11/07/2017   MCV 98.5 11/07/2017   PLT 253 11/07/2017      Chemistry      Component Value Date/Time   NA 141 07/26/2017 1454   NA 142 06/28/2016 1458   K 5.2 07/26/2017 1454   K 5.2 (H) 06/28/2016 1458   CL 103 07/26/2017 1454   CO2 22 07/26/2017 1454   CO2 24 06/28/2016 1458   BUN 19 07/26/2017 1454   BUN 23.6 06/28/2016 1458   CREATININE 0.93 07/26/2017 1454   CREATININE 0.97 07/08/2016 1042   CREATININE 1.0 06/28/2016 1458      Component Value Date/Time   CALCIUM 9.5 07/26/2017 1454   CALCIUM 10.3 06/28/2016 1458   ALKPHOS 102 07/26/2017 1454   ALKPHOS 101 06/28/2016 1458   AST 16 07/26/2017 1454   AST 18 06/28/2016 1458   ALT 17 07/26/2017 1454   ALT 22 06/28/2016 1458   BILITOT 0.2 07/26/2017 1454   BILITOT 0.48 06/28/2016 1458       No results found for: LABCA2  No components found for: LABCA125  No results for input(s): INR in the last 168 hours.  Urinalysis    Component Value Date/Time   COLORURINE YELLOW 05/15/2015 [place] 05/15/2015 1218   LABSPEC 1.012 05/15/2015 1218   PHURINE 5.0 05/15/2015 1218   GLUCOSEU NEGATIVE 05/15/2015 1218   HGBUR NEGATIVE  05/15/2015 1218   BILIRUBINUR n 11/25/2015 1712   KETONESUR NEGATIVE 05/15/2015 1218   PROTEINUR n 11/25/2015 1712   PROTEINUR NEGATIVE 05/15/2015 1218   UROBILINOGEN negative 11/25/2015 1712   NITRITE n 11/25/2015 1712   NITRITE NEGATIVE 05/15/2015 1218   LEUKOCYTESUR small (1+) (A) 11/25/2015 1712      ELIGIBLE FOR AVAILABLE RESEARCH PROTOCOL: no  STUDIES: Since her last visit, she underwent diagnostic bilateral mammography with CAD and tomography on  08/21/2017 at [facility] showing: breast density category C. There was no evidence of malignancy.    ASSESSMENT: 72 y.o. [place] woman  Status post left breast upper inner quadrant biopsy 07/13/2015 for a clinical [place] N0 invasive ductal carcinoma, grade 1, estrogen and progesterone receptor positive, HER-2 nonamplified, with an MIB-1 of 3%.  (a) biopsy of a suspicious lesion in the right breast proved to be a small fibroadenoma  (1) left lumpectomy and sentinel lymph node sampling 09/16/2015 showed a pT1a pN0 invasive ductal carcinoma, grade 1, with negative margins, stage IA    (2) adjuvant radiation completed 11/18/2015  (3) anastrozole started 12/14/2015.  (a) bone density on 08/16/2016 showed a T score of of -2.0 osteopenia -left hip improved from prior 2002  PLAN: Brandi Stephens Stephens is now a little over 2 years out from definitive surgery for her breast cancer with no evidence of disease recurrence.  This is very favorable.  She is tolerating anastrozole well and the plan will be to continue that a total of 5 years.  Her bone density in 2018 was actually improved.  We discussed smoking cessation.  Her husband recently had heart issues and has stopped smoking.  There are now also on a better diet.  This is very motivating to her  Otherwise she will see me again in 1 year.  She knows to call for any problems that may develop before the next visit. , Virgie Dad, MD  11/07/17 2:09 PM Medical Oncology and Hematology [facility] [address] [place],  [address] Tel. [phone]    Fax. [phone]  Alice Rieger, am acting as scribe for Chauncey Cruel MD.  I, Lurline Del MD, have reviewed the above documentation for accuracy and completeness, and I agree with the above.

## 2017-11-06 NOTE — Progress Notes (Deleted)
Follow-up Visit   Date: ***   Brandi Stephens MRN: [id number] DOB: 06/04/1945   Interim History: Brandi Stephens is a 72 y.o. right-handed Caucasian female with hypertension, COPD, left breast cancer (2017), hypothyroidism, essential tremor, lumbar spondylosis s/p L4-S1 PLIF (03/2015) and chronic low back pain returning to the clinic for follow-up of ***.  The patient was accompanied to the clinic by *** who also provides collateral information.    History of present illness: In December 2016 she had low back surgery and since this time, she had numbness involving the toes and balls of the feet which is worse in the left.  She also complains of burning and tingling pain in the feet which wakes her up from sleeping and is less apparent during the day. She denies any weakness of the feet, imbalance, and walks unassisted.  She does not wish to be on any medication for her painful paresthesias.  She had no history of diabetes, alcoholism, or chemotherapy. Her recent TSH, vitamin B12, and HbA1c was normal.  For her low back pain, she sees Dr. Maryjean Ka for her low back pain and has ESI which did not help. She was tried on gabapentin but did not appreciate any benefit and developed swelling.     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  She takes requip 0.25mg  for RLS and primidone 25mg  at bedtime for tremors which are not controlling her symptoms well.  Her mother also has history of essential tremor.    UPDATE 11/06/2017: ***  Medications:  Current Outpatient Medications on File Prior to Visit  Medication Sig Dispense Refill  . allopurinol (ZYLOPRIM) 100 MG tablet TAKE TWO TABLETS BY MOUTH DAILY 180 tablet 0  . anastrozole (ARIMIDEX) 1 MG tablet TAKE ONE TABLET BY MOUTH DAILY 90 tablet 2  . atorvastatin (LIPITOR) 20 MG tablet Take 1 tablet (20 mg total) by mouth daily. 90 tablet 0  .  baclofen (LIORESAL) 10 MG tablet Take 10 mg by mouth 3 (three) times daily as needed.     Marland Kitchen BIOTIN 5000 PO Take 1 capsule by mouth daily.     . busPIRone (BUSPAR) 10 MG tablet Take one before lunch and 2 before bedtime    . Calcium Carbonate-Vitamin D (CALCIUM 500/D PO) Take by mouth.    . Cholecalciferol (D3 VITAMIN PO) Take by mouth.    . colchicine 0.6 MG tablet Take 2 tablets now and 1 tablet an hour later for flare up 6 tablet 0  . furosemide (LASIX) 20 MG tablet Take 20 mg by mouth daily as needed. Reported on 10/28/2015    . KLOR-CON 10 10 MEQ tablet Take 10 mEq by mouth as needed. Reported on 10/28/2015    . levothyroxine (SYNTHROID, LEVOTHROID) 75 MCG tablet TAKE ONE TABLET BY MOUTH DAILY 90 tablet 1  . LINZESS 145 MCG CAPS capsule TAKE ONE CAPSULE BY MOUTH DAILY BEFORE BREAKFAST 30 capsule 0  . LORazepam (ATIVAN) 0.5 MG tablet Take 1 tablet (0.5 mg total) by mouth 2 (two) times daily as needed for anxiety. 30 tablet 0  . MAGNESIUM CITRATE PO Take 2 tablets by mouth at bedtime. Reported on 09/29/2015    . Melatonin-Pyridoxine (MELATONIN-B6 PO) Take 1 tablet by mouth at bedtime.    . metoprolol succinate (TOPROL-XL) 25 MG 24 hr tablet Take 0.5 tablets (12.5 mg total) by mouth daily. 45 tablet  1  . olmesartan-hydrochlorothiazide (BENICAR HCT) 40-12.5 MG tablet Take 1 tablet by mouth daily. 90 tablet 1  . rOPINIRole (REQUIP) 0.5 MG tablet TAKE ONE TABLET BY MOUTH AT BEDTIME 60 tablet 0  . traMADol (ULTRAM) 50 MG tablet TAKE ONE TABLET BY MOUTH EVERY 6 HOURS AS NEEDED 90 tablet 0   No current facility-administered medications on file prior to visit.     Allergies:  Allergies  Allergen Reactions  . Ampicillin Hives    Severe reaction in February 2017 1.5 month to have hives to go away  . Codeine Nausea And Vomiting  . Morphine And Related Itching  . Penicillins Other (See Comments)    Has patient had a PCN reaction causing immediate rash, facial/tongue/throat swelling, SOB or  lightheadedness with hypotension: No Has patient had a PCN reaction causing severe rash involving mucus membranes or skin necrosis: No Has patient had a PCN reaction that required hospitalization No Has patient had a PCN reaction occurring within the last 10 years: No If all of the above answers are "NO", then may proceed with Cephalosporin use.     Review of Systems:  CONSTITUTIONAL: No fevers, chills, night sweats, or weight loss.  EYES: No visual changes or eye pain ENT: No hearing changes.  No history of nose bleeds.   RESPIRATORY: No cough, wheezing and shortness of breath.   CARDIOVASCULAR: Negative for chest pain, and palpitations.   GI: Negative for abdominal discomfort, blood in stools or black stools.  No recent change in bowel habits.   GU:  No history of incontinence.   MUSCLOSKELETAL: No history of joint pain or swelling.  No myalgias.   SKIN: Negative for lesions, rash, and itching.   ENDOCRINE: Negative for cold or heat intolerance, polydipsia or goiter.   PSYCH:  *** depression or anxiety symptoms.   NEURO: As Above.   Vital Signs:  There were no vitals taken for this visit. Pain Scale: *** on a scale of 0-10   General: *** Neck: *** carotid bruit CV: *** Ext: ***  Neurological Exam: MENTAL STATUS including orientation to time, place, person, recent and remote memory, attention span and concentration, language, and fund of knowledge is ***normal.  Speech is not dysarthric.  CRANIAL NERVES: No visual field defects. *** Pupils equal round and reactive to light.  Normal conjugate, extra-ocular eye movements in all directions of gaze.  No ptosis ***. Normal facial sensation.  Face is symmetric. Palate elevates symmetrically.  Tongue is midline.  MOTOR:  Motor strength is 5/5 in all extremities, except ***.  No atrophy, fasciculations or abnormal movements.  No pronator drift.  Tone is normal.    MSRs:  Reflexes are 2+/4 throughout, except ***.  SENSORY:  Intact  to ***.  COORDINATION/GAIT:  Normal finger-to- nose-finger and heel-to-shin.  Intact rapid alternating movements bilaterally.  Gait narrow based and stable.   Data:*** MRI lumbar spine 11/22/2014: The dominant RIGHT-sided abnormality is at L5-S1 where an extruded disc fragment extends cephalad on the RIGHT. Compounding this is central disc osteophyte complex, severe disc space narrowing, and posterior element hypertrophy with resultant BILATERAL L5 nerve root impingement, worse on the RIGHT. Central protrusion L4-5 with posterior element hypertrophy and moderate canal stenosis. BILATERAL L5 nerve root impingement is observed, RIGHT greater than LEFT, exacerbated by RIGHT-sided synovial cyst, along with probable RIGHT L4 nerve root compression in the foramen. Central protrusion at L3-4 not clearly asymmetric to the RIGHT. Foraminal disc material on the LEFT at L2-3 and L3-4 also  observed. 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 Increased body habitus. Increased epidural fat is observed opposite the L4 and L5 levels.   IMPRESSION/PLAN***: *** 1.  Paresthesias of the feet, likely due to neuropathy and possibly contributed by lumbar nerve impingement              - NCS/EMG of the legs was declined as it would likely not change management which would be symptomatic ***                  - Alternatively, if this was all stemming from her back, management would be symptomatic with medications such as Lyrica or nortriptyline, however, she does not wish to be on any pharmacological therapy for her symptoms.              - I offered OTC lidocaine ointment which she can use as needed for severe burning/tingling, which she is agreeable to trying  2.  Benign essential tremor              - increase primidone to 50mg  at bedtime   3.  Restless leg syndrome              - Increase ropinirole to 0.5mg  at bedtime  4.  Lumbar spondylosis s/p L4-S1 PLIF (03/2015) by Dr. Kathyrn Sheriff, she is  receiving ESI by Dr. Maryjean Ka  Return to clinic in 6 months    The duration of this appointment visit was *** minutes of face-to-face time with the patient.  Greater than 50% of this time was spent in counseling, explanation of diagnosis, planning of further management, and coordination of care.   Thank you for allowing me to participate in patient's care.  If I can answer any additional questions, I would be pleased to do so.    Sincerely,    Maurissa Ambrose K. Posey Pronto, DO

## 2017-11-07 ENCOUNTER — Telehealth: Payer: Self-pay | Admitting: Oncology

## 2017-11-07 ENCOUNTER — Inpatient Hospital Stay (HOSPITAL_BASED_OUTPATIENT_CLINIC_OR_DEPARTMENT_OTHER): Payer: PPO | Admitting: Oncology

## 2017-11-07 ENCOUNTER — Inpatient Hospital Stay: Payer: PPO | Attending: Oncology

## 2017-11-07 VITALS — BP 120/80 | HR 76 | Temp 98.9°F | Resp 18 | Ht 66.5 in | Wt 238.0 lb

## 2017-11-07 DIAGNOSIS — F419 Anxiety disorder, unspecified: Secondary | ICD-10-CM | POA: Insufficient documentation

## 2017-11-07 DIAGNOSIS — Z79811 Long term (current) use of aromatase inhibitors: Secondary | ICD-10-CM | POA: Diagnosis not present

## 2017-11-07 DIAGNOSIS — Z800 Family history of malignant neoplasm of digestive organs: Secondary | ICD-10-CM | POA: Insufficient documentation

## 2017-11-07 DIAGNOSIS — Z801 Family history of malignant neoplasm of trachea, bronchus and lung: Secondary | ICD-10-CM

## 2017-11-07 DIAGNOSIS — Z170 Estrogen receptor positive status [ER+]: Secondary | ICD-10-CM | POA: Diagnosis not present

## 2017-11-07 DIAGNOSIS — Z808 Family history of malignant neoplasm of other organs or systems: Secondary | ICD-10-CM | POA: Insufficient documentation

## 2017-11-07 DIAGNOSIS — Z90710 Acquired absence of both cervix and uterus: Secondary | ICD-10-CM | POA: Insufficient documentation

## 2017-11-07 DIAGNOSIS — Z87891 Personal history of nicotine dependence: Secondary | ICD-10-CM | POA: Insufficient documentation

## 2017-11-07 DIAGNOSIS — C50212 Malignant neoplasm of upper-inner quadrant of left female breast: Secondary | ICD-10-CM | POA: Insufficient documentation

## 2017-11-07 DIAGNOSIS — Z803 Family history of malignant neoplasm of breast: Secondary | ICD-10-CM | POA: Insufficient documentation

## 2017-11-07 DIAGNOSIS — Z923 Personal history of irradiation: Secondary | ICD-10-CM | POA: Insufficient documentation

## 2017-11-07 DIAGNOSIS — J449 Chronic obstructive pulmonary disease, unspecified: Secondary | ICD-10-CM | POA: Diagnosis not present

## 2017-11-07 DIAGNOSIS — I10 Essential (primary) hypertension: Secondary | ICD-10-CM

## 2017-11-07 DIAGNOSIS — Z85828 Personal history of other malignant neoplasm of skin: Secondary | ICD-10-CM | POA: Insufficient documentation

## 2017-11-07 DIAGNOSIS — Z79899 Other long term (current) drug therapy: Secondary | ICD-10-CM | POA: Insufficient documentation

## 2017-11-07 DIAGNOSIS — E039 Hypothyroidism, unspecified: Secondary | ICD-10-CM

## 2017-11-07 DIAGNOSIS — N6310 Unspecified lump in the right breast, unspecified quadrant: Secondary | ICD-10-CM

## 2017-11-07 DIAGNOSIS — R7303 Prediabetes: Secondary | ICD-10-CM | POA: Diagnosis not present

## 2017-11-07 LAB — COMPREHENSIVE METABOLIC PANEL
ALK PHOS: 101 U/L (ref 38–126)
ALT: 16 U/L (ref 0–44)
AST: 14 U/L — AB (ref 15–41)
Albumin: 3.7 g/dL (ref 3.5–5.0)
Anion gap: 8 (ref 5–15)
BILIRUBIN TOTAL: 0.2 mg/dL — AB (ref 0.3–1.2)
BUN: 25 mg/dL — AB (ref 8–23)
CALCIUM: 9.7 mg/dL (ref 8.9–10.3)
CO2: 23 mmol/L (ref 22–32)
CREATININE: 1.04 mg/dL — AB (ref 0.44–1.00)
Chloride: 112 mmol/L — ABNORMAL HIGH (ref 98–111)
GFR calc Af Amer: >60 mL/min (ref >60)
GFR, EST NON AFRICAN AMERICAN: 53 mL/min — AB (ref >60)
Glucose, Bld: 118 mg/dL — ABNORMAL HIGH (ref 70–99)
Potassium: 5.2 mmol/L — ABNORMAL HIGH (ref 3.5–5.1)
Sodium: 143 mmol/L (ref 135–145)
TOTAL PROTEIN: 6.5 g/dL (ref 6.5–8.1)

## 2017-11-07 LAB — CBC WITH DIFFERENTIAL/PLATELET
BASOS PCT: 1 %
Basophils Absolute: 0.1 10*3/uL (ref 0.0–0.1)
EOS ABS: 0.3 10*3/uL (ref 0.0–0.5)
Eosinophils Relative: 3 %
HCT: 37.6 % (ref 34.8–46.6)
Hemoglobin: 12.5 g/dL (ref 11.6–15.9)
LYMPHS ABS: 1.7 10*3/uL (ref 0.9–3.3)
Lymphocytes Relative: 20 %
MCH: 32.7 pg (ref 25.1–34.0)
MCHC: 33.2 g/dL (ref 31.5–36.0)
MCV: 98.5 fL (ref 79.5–101.0)
MONO ABS: 0.6 10*3/uL (ref 0.1–0.9)
Monocytes Relative: 8 %
Neutro Abs: 5.9 10*3/uL (ref 1.5–6.5)
Neutrophils Relative %: 68 %
PLATELETS: 253 10*3/uL (ref 145–400)
RBC: 3.82 MIL/uL (ref 3.70–5.45)
RDW: 15.3 % — ABNORMAL HIGH (ref 11.2–14.5)
WBC: 8.6 10*3/uL (ref 3.9–10.3)

## 2017-11-07 NOTE — Telephone Encounter (Signed)
Gave patient avs and calendar.   °

## 2017-11-08 DIAGNOSIS — M1712 Unilateral primary osteoarthritis, left knee: Secondary | ICD-10-CM | POA: Diagnosis not present

## 2017-11-09 ENCOUNTER — Other Ambulatory Visit: Payer: Self-pay

## 2017-11-09 ENCOUNTER — Telehealth: Payer: Self-pay

## 2017-11-09 MED ORDER — ATORVASTATIN CALCIUM 20 MG PO TABS: 20.0000 mg | ORAL_TABLET | Freq: Every day | ORAL | 0 refills | Status: DC

## 2017-11-09 NOTE — Telephone Encounter (Signed)
Pt has called to get a refill on Atorvastatin. Pt pharmacy has also been updated.

## 2017-11-09 NOTE — Telephone Encounter (Signed)
Patient wants to know if she should still bee getting refills on her tremor medication.

## 2017-11-11 ENCOUNTER — Other Ambulatory Visit: Payer: Self-pay | Admitting: Family Medicine

## 2017-11-11 DIAGNOSIS — K5900 Constipation, unspecified: Secondary | ICD-10-CM

## 2017-11-12 ENCOUNTER — Other Ambulatory Visit: Payer: Self-pay | Admitting: Family Medicine

## 2017-11-12 NOTE — Telephone Encounter (Signed)
Please check with her about the medication she is taking for tremors. This was initially prescribed by a neurologist, she may recall who that was exactly. I also referred her to a neurologist in the recent past for paresthesias and please find out if she scheduled with them. I will defer to the neurologist to determine if she needs to continue or may stop this medication. Thanks.

## 2017-11-13 NOTE — Telephone Encounter (Signed)
Please give her 30-60 days worth and she will need to come in for a medicare AWV.

## 2017-11-13 NOTE — Telephone Encounter (Signed)
Pt is scheduled for august 15th. Will refill for 30 days

## 2017-11-13 NOTE — Telephone Encounter (Signed)
Is this okay to refill? 

## 2017-11-13 NOTE — Telephone Encounter (Signed)
Pt will contact Dr. Posey Pronto about this med

## 2017-11-15 DIAGNOSIS — M1712 Unilateral primary osteoarthritis, left knee: Secondary | ICD-10-CM | POA: Diagnosis not present

## 2017-11-19 ENCOUNTER — Other Ambulatory Visit: Payer: Self-pay | Admitting: Family Medicine

## 2017-11-20 NOTE — Telephone Encounter (Signed)
Ok to give her 90 days. She is coming in later this month.

## 2017-11-20 NOTE — Telephone Encounter (Signed)
Is this okay to refill? 

## 2017-11-22 DIAGNOSIS — M1712 Unilateral primary osteoarthritis, left knee: Secondary | ICD-10-CM | POA: Diagnosis not present

## 2017-11-23 DIAGNOSIS — M1712 Unilateral primary osteoarthritis, left knee: Secondary | ICD-10-CM | POA: Diagnosis not present

## 2017-11-27 DIAGNOSIS — M48061 Spinal stenosis, lumbar region without neurogenic claudication: Secondary | ICD-10-CM | POA: Diagnosis not present

## 2017-11-27 DIAGNOSIS — Z6835 Body mass index (BMI) 35.0-35.9, adult: Secondary | ICD-10-CM | POA: Diagnosis not present

## 2017-11-27 DIAGNOSIS — M4726 Other spondylosis with radiculopathy, lumbar region: Secondary | ICD-10-CM | POA: Diagnosis not present

## 2017-11-27 DIAGNOSIS — M533 Sacrococcygeal disorders, not elsewhere classified: Secondary | ICD-10-CM | POA: Diagnosis not present

## 2017-11-29 DIAGNOSIS — M1712 Unilateral primary osteoarthritis, left knee: Secondary | ICD-10-CM | POA: Diagnosis not present

## 2017-11-30 ENCOUNTER — Ambulatory Visit (INDEPENDENT_AMBULATORY_CARE_PROVIDER_SITE_OTHER): Payer: PPO | Admitting: Family Medicine

## 2017-11-30 ENCOUNTER — Encounter: Payer: Self-pay | Admitting: Family Medicine

## 2017-11-30 VITALS — BP 120/80 | HR 108 | Ht 66.25 in | Wt 228.6 lb

## 2017-11-30 DIAGNOSIS — E039 Hypothyroidism, unspecified: Secondary | ICD-10-CM | POA: Insufficient documentation

## 2017-11-30 DIAGNOSIS — R609 Edema, unspecified: Secondary | ICD-10-CM

## 2017-11-30 DIAGNOSIS — I10 Essential (primary) hypertension: Secondary | ICD-10-CM

## 2017-11-30 DIAGNOSIS — M1000 Idiopathic gout, unspecified site: Secondary | ICD-10-CM | POA: Diagnosis not present

## 2017-11-30 DIAGNOSIS — F411 Generalized anxiety disorder: Secondary | ICD-10-CM | POA: Diagnosis not present

## 2017-11-30 DIAGNOSIS — R7303 Prediabetes: Secondary | ICD-10-CM

## 2017-11-30 DIAGNOSIS — E875 Hyperkalemia: Secondary | ICD-10-CM

## 2017-11-30 DIAGNOSIS — Z79899 Other long term (current) drug therapy: Secondary | ICD-10-CM

## 2017-11-30 DIAGNOSIS — E782 Mixed hyperlipidemia: Secondary | ICD-10-CM

## 2017-11-30 DIAGNOSIS — G250 Essential tremor: Secondary | ICD-10-CM | POA: Diagnosis not present

## 2017-11-30 DIAGNOSIS — Z0000 Encounter for general adult medical examination without abnormal findings: Secondary | ICD-10-CM | POA: Diagnosis not present

## 2017-11-30 DIAGNOSIS — G2581 Restless legs syndrome: Secondary | ICD-10-CM | POA: Diagnosis not present

## 2017-11-30 LAB — POCT URINALYSIS DIP (PROADVANTAGE DEVICE)
BILIRUBIN UA: NEGATIVE
BILIRUBIN UA: NEGATIVE mg/dL
Glucose, UA: NEGATIVE mg/dL
Leukocytes, UA: NEGATIVE
Nitrite, UA: NEGATIVE
PH UA: 6 (ref 5.0–8.0)
PROTEIN UA: NEGATIVE mg/dL
RBC UA: NEGATIVE
Specific Gravity, Urine: 1.025
Urobilinogen, Ur: NEGATIVE

## 2017-11-30 MED ORDER — PRIMIDONE 50 MG PO TABS: 50.0000 mg | ORAL_TABLET | Freq: Every day | ORAL | 2 refills | Status: DC

## 2017-11-30 NOTE — Patient Instructions (Signed)
Call your insurance and check on your co-pay for Shingrix before you go to the pharmacy to get it.   Continue on current medications.  Follow up with your neurologist.

## 2017-11-30 NOTE — Progress Notes (Signed)
Brandi Stephens is a 72 y.o. female who presents for annual wellness visit and follow-up on chronic medical conditions.  She has the following concerns:  She recently saw Dr. Jana Hakim to follow up on breast cancer. Doing well. States she now only has to see oncology once annually.  Requests refill on primidone, lasix, potassium.   Has been stable on primidone for essential tremor. She will schedule with her neurologist for this.  Prediabetes with a Hgb A1c 6.2% in April.   LE edema is significantly improved.  HTN- taking Benicar, metoprolol, Lasix. Does not check BP at home.   Hyperlipidemia- taking statin daily. No side effects or concerns with it.  Gout- no recent flares. Takes allopurinol daily. Has colchicine for flare ups.  Thyroid- taking synthroid daily on an empty stomach.  Linzess for chronic constipation. Seems to be working well.    Anxiety- taking Buspar. This is helping and is managed by her psychiatrist.   States she has fallen due to back and knee pain. Last fell one week ago up her stairs in her home. States saw her orthopedist for this yesterday. Had an injection and is getting the gel shots in her knee. States she got an injection in her knee and hip. States they do not want her to have PT yet. She may have a knee replacement.  She also saw her neurosurgeon this week. States she was told her SI joint is acting up. She will see Dr. Maryjean Ka at the pain clinic for injections. If not improving she will have an MRI.  She had major oral surgery. 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  RLS- still having issues with this. Requip is helping.  Insomnia- started on Lunesta by her psychiatrist   Her husband had CABG with bypass in May unexpectantly.    States she is interested in the Shingrix vaccine.     Immunization History  Administered Date(s) Administered  . Influenza-Unspecified 12/17/2013  . Pneumococcal Conjugate-13 01/14/2014  . Pneumococcal  Polysaccharide-23 07/06/2016  . Tdap 07/02/2013   Last Pap smear:  Had hysterectomy Last mammogram: may 2019 Last colonoscopy: 2014- scheduled for colonoscopy in September  Last DEXA: may 2018 Dentist:  Sheran Luz Ophtho: bowen- less than 6 months Exercise: try to exercise a couple times a week  Other doctors caring for patient include: Dr. Jana Hakim- Oncology Dr. Havery Moros- GI Dr. Posey Pronto- neurologist Dr. Debara Pickett- cardiologist  Dr. Buelah Manis- oral surgeon Dr. Maryjean Ka- pain management Dr. Elenor Legato- neurosurgeon Dr. Hal Morales- [facility]opedist at [facility]  Dr. Casimiro Needle- psychiatrist      Depression screen:  See questionnaire below.  Depression screen [facility] 2/9 11/30/2017 07/06/2016 01/06/2016 06/08/2015  Decreased Interest 0 0 0 1  Down, Depressed, Hopeless 0 0 0 0  PHQ - 2 Score 0 0 0 1    Fall Risk Screen: see questionnaire below. Fall Risk  11/30/2017 09/09/2016 07/06/2016 01/06/2016 09/30/2015  Falls in the past year? Yes No No No No  Number falls in past yr: 2 or more - - - -  Injury with Fall? Yes - - - -    ADL screen:  See questionnaire below Functional Status Survey: Is the patient deaf or have difficulty hearing?: No Does the patient have difficulty seeing, even when wearing glasses/contacts?: No Does the patient have difficulty concentrating, remembering, or making decisions?: No Does the patient have difficulty walking or climbing stairs?: Yes(with knee pain and back pain) Does the patient have difficulty dressing or bathing?: No Does  the patient have difficulty doing errands alone such as visiting a doctor's office or shopping?: No   End of Life Discussion:  Patient has a living will and medical power of attorney  Review of Systems Constitutional: -fever, -chills, -sweats, -unexpected weight change, -anorexia, -fatigue Allergy: -sneezing, -itching, -congestion Dermatology: denies changing moles, rash, lumps, new worrisome lesions ENT: -runny nose, -ear pain, -sore  throat, -hoarseness, -sinus pain, -teeth pain, -tinnitus, -hearing loss, -epistaxis Cardiology:  -chest pain, -palpitations, -edema, -orthopnea, -paroxysmal nocturnal dyspnea Respiratory: -cough, -shortness of breath, -dyspnea on exertion, -wheezing, -hemoptysis Gastroenterology: -abdominal pain, -nausea, -vomiting, -diarrhea, -constipation, -blood in stool, -changes in bowel movement, -dysphagia Hematology: -bleeding or bruising problems Musculoskeletal: + (knee pain) arthralgias, -myalgias, -joint swelling, +back pain, -neck pain, -cramping, -gait changes Ophthalmology: -vision changes, -eye redness, -itching, -discharge Urology: -dysuria, -difficulty urinating, -hematuria, -urinary frequency, -urgency, incontinence Neurology: -headache, -weakness, -tingling, -numbness, -speech abnormality, -memory loss, +falls, -dizziness Psychology:  -depressed mood, -agitation, -sleep problems    PHYSICAL EXAM:  BP 120/80   Pulse (!) 108   Ht 5' 6.25" (1.683 m)   Wt 228 lb 9.6 oz (103.7 kg)   BMI 36.62 kg/m   General Appearance: Alert, cooperative, no distress, appears stated age Head: Normocephalic, without obvious abnormality, atraumatic Eyes: PERRL, conjunctiva/corneas clear, EOM's intact, fundi benign Ears: Normal TM's and external ear canals Nose: Nares normal, mucosa normal, no drainage or sinus tenderness Throat: Lips, mucosa, and tongue normal; teeth and gums normal Neck: Supple, no lymphadenopathy; thyroid: no enlargement/tenderness/nodules; no carotid bruit or JVD Back: Spine nontender, no curvature, ROM normal, no CVA tenderness Lungs: Clear to auscultation bilaterally without wheezes, rales or ronchi; respirations unlabored Chest Wall: No tenderness or deformity Heart: Regular rate and rhythm, S1 and S2 normal, no murmur, rub or gallop Breast Exam: mammogram UTD. She saw her oncologist last month  Abdomen: Soft, non-tender, nondistended, normoactive bowel sounds, no masses, no  hepatosplenomegaly Genitalia: refuses  Extremities: No clubbing, cyanosis or edema Pulses: 2+ and symmetric all extremities Skin: Skin color, texture, turgor normal, no rashes or lesions Lymph nodes: Cervical, supraclavicular, and axillary nodes normal Neurologic: CNII-XII intact, normal strength, sensation and gait; reflexes 2+ and symmetric throughout Psych: Normal mood, affect, hygiene and grooming.  ASSESSMENT/PLAN: Medicare annual wellness visit, subsequent - Plan: POCT Urinalysis DIP (Proadvantage Device)  Routine general medical examination at a health care facility - Plan: Basic metabolic panel, TSH  Essential hypertension - Plan: Basic metabolic panel  Benign essential tremor  Dependent edema  Mixed hyperlipidemia  Generalized anxiety disorder  Idiopathic gout, unspecified chronicity, unspecified site - Plan: Uric acid  Prediabetes - Plan: Hemoglobin A1c  RLS (restless legs syndrome)  Hyperkalemia - Plan: Basic metabolic panel  Hypothyroidism, unspecified type - Plan: TSH  Medication management - Plan: TSH, Uric acid  Appears to be doing well overall.  HTN- BP is in goal range. Continue on current medication.  Recent labs showed elevated potassium. Will check BMP and refill Lasix and potassium as appropriate.  Gout- no recent flares. Check uric acid. Avoid triggers.  Follow up with neurologist for neuropathy, RLS and tremors.  She will continue seeing her psychiatrist for insomnia, anxiety.  She is under the care of her neurosurgeon and orthopedic surgeon for recent falls related to knee and back pain.  Check TSH and continue on current levothyroxine.  LE edema- none today Prescription for Shingrix given to patient.  She will return for flu shot.  Follow up pending labs.   Discussed monthly self breast exams  and yearly mammograms; at least 30 minutes of aerobic activity at least 5 days/week and weight-bearing exercise 2x/week; proper sunscreen use reviewed;  healthy diet, including goals of calcium and vitamin D intake and alcohol recommendations (less than or equal to 1 drink/day) reviewed; regular seatbelt use; changing batteries in smoke detectors.  Immunization recommendations discussed.  Colonoscopy recommendations reviewed   Medicare Attestation I have personally reviewed: The patient's medical and social history Their use of alcohol, tobacco or illicit drugs Their current medications and supplements The patient's functional ability including ADLs,fall risks, home safety risks, cognitive, and hearing and visual impairment Diet and physical activities Evidence for depression or mood disorders  The patient's weight, height, and BMI have been recorded in the chart.  I have made referrals, counseling, and provided education to the patient based on review of the above and I have provided the patient with a written personalized care plan for preventive services.     Harland Dingwall, NP-C   11/30/2017

## 2017-12-01 LAB — BASIC METABOLIC PANEL
BUN / CREAT RATIO: 29 — AB (ref 12–28)
BUN: 27 mg/dL (ref 8–27)
CO2: 22 mmol/L (ref 20–29)
Calcium: 10.1 mg/dL (ref 8.7–10.3)
Chloride: 107 mmol/L — ABNORMAL HIGH (ref 96–106)
Creatinine, Ser: 0.94 mg/dL (ref 0.57–1.00)
GFR calc Af Amer: 71 mL/min/{1.73_m2} (ref >59)
GFR calc non Af Amer: 61 mL/min/{1.73_m2} (ref >59)
GLUCOSE: 118 mg/dL — AB (ref 65–99)
POTASSIUM: 5.1 mmol/L (ref 3.5–5.2)
SODIUM: 145 mmol/L — AB (ref 134–144)

## 2017-12-01 LAB — HEMOGLOBIN A1C
Est. average glucose Bld gHb Est-mCnc: 128 mg/dL
Hgb A1c MFr Bld: 6.1 % — ABNORMAL HIGH (ref 4.8–5.6)

## 2017-12-01 LAB — URIC ACID: Uric Acid: 6.7 mg/dL (ref 2.5–7.1)

## 2017-12-01 LAB — TSH: TSH: 0.661 u[IU]/mL (ref 0.450–4.500)

## 2017-12-04 ENCOUNTER — Other Ambulatory Visit: Payer: Self-pay | Admitting: Family Medicine

## 2017-12-04 MED ORDER — POTASSIUM CHLORIDE ER 10 MEQ PO TBCR
10.0000 meq | EXTENDED_RELEASE_TABLET | ORAL | 1 refills | Status: DC | PRN
Start: 2017-12-04 — End: 2018-11-08

## 2017-12-04 MED ORDER — FUROSEMIDE 20 MG PO TABS: 20.0000 mg | ORAL_TABLET | Freq: Every day | ORAL | 1 refills | Status: DC | PRN

## 2017-12-07 ENCOUNTER — Encounter: Payer: PPO | Admitting: Gastroenterology

## 2017-12-14 ENCOUNTER — Other Ambulatory Visit: Payer: Self-pay

## 2017-12-14 ENCOUNTER — Ambulatory Visit (AMBULATORY_SURGERY_CENTER): Payer: Self-pay | Admitting: *Deleted

## 2017-12-14 VITALS — Ht 69.0 in | Wt 231.2 lb

## 2017-12-14 DIAGNOSIS — Z86010 Personal history of colonic polyps: Secondary | ICD-10-CM

## 2017-12-14 MED ORDER — SUPREP BOWEL PREP KIT 17.5-3.13-1.6 GM/177ML PO SOLN: 1.0000 | Freq: Once | ORAL | 0 refills | Status: AC

## 2017-12-14 NOTE — Progress Notes (Signed)
Patient denies any allergies to egg or soy products. Patient has PONV with anesthesia/sedation.  Patient denies oxygen use at home and denies diet medications. Patient denies information on colonoscopy. 

## 2017-12-22 ENCOUNTER — Encounter: Payer: Self-pay | Admitting: Gastroenterology

## 2018-01-01 DIAGNOSIS — F4321 Adjustment disorder with depressed mood: Secondary | ICD-10-CM | POA: Diagnosis not present

## 2018-01-05 ENCOUNTER — Encounter: Payer: Self-pay | Admitting: Gastroenterology

## 2018-01-05 ENCOUNTER — Ambulatory Visit (AMBULATORY_SURGERY_CENTER): Payer: PPO | Admitting: Gastroenterology

## 2018-01-05 VITALS — BP 109/62 | HR 81 | Temp 96.4°F | Resp 21 | Ht 69.0 in | Wt 231.0 lb

## 2018-01-05 DIAGNOSIS — D129 Benign neoplasm of anus and anal canal: Secondary | ICD-10-CM

## 2018-01-05 DIAGNOSIS — Z86010 Personal history of colonic polyps: Secondary | ICD-10-CM | POA: Diagnosis not present

## 2018-01-05 DIAGNOSIS — D128 Benign neoplasm of rectum: Secondary | ICD-10-CM | POA: Diagnosis not present

## 2018-01-05 DIAGNOSIS — K5900 Constipation, unspecified: Secondary | ICD-10-CM | POA: Diagnosis not present

## 2018-01-05 DIAGNOSIS — D125 Benign neoplasm of sigmoid colon: Secondary | ICD-10-CM

## 2018-01-05 DIAGNOSIS — K635 Polyp of colon: Secondary | ICD-10-CM

## 2018-01-05 DIAGNOSIS — D123 Benign neoplasm of transverse colon: Secondary | ICD-10-CM | POA: Diagnosis not present

## 2018-01-05 DIAGNOSIS — J449 Chronic obstructive pulmonary disease, unspecified: Secondary | ICD-10-CM | POA: Diagnosis not present

## 2018-01-05 DIAGNOSIS — K6282 Dysplasia of anus: Secondary | ICD-10-CM | POA: Diagnosis not present

## 2018-01-05 DIAGNOSIS — D127 Benign neoplasm of rectosigmoid junction: Secondary | ICD-10-CM | POA: Diagnosis not present

## 2018-01-05 MED ORDER — SODIUM CHLORIDE 0.9 % IV SOLN: 500.0000 mL | Freq: Once | INTRAVENOUS | Status: DC

## 2018-01-05 NOTE — Progress Notes (Signed)
Report given to PACU, vss 

## 2018-01-05 NOTE — Patient Instructions (Signed)
YOU HAD AN ENDOSCOPIC PROCEDURE TODAY AT [facility]:   Refer to the procedure report that was given to you for any specific questions about what was found during the examination.  If the procedure report does not answer your questions, please call your gastroenterologist to clarify.  If you requested that your care partner not be given the details of your procedure findings, then the procedure report has been included in a sealed envelope for you to review at your convenience later.  YOU SHOULD EXPECT: Some feelings of bloating in the abdomen. Passage of more gas than usual.  Walking can help get rid of the air that was put into your GI tract during the procedure and reduce the bloating. If you had a lower endoscopy (such as a colonoscopy or flexible sigmoidoscopy) you may notice spotting of blood in your stool or on the toilet paper. If you underwent a bowel prep for your procedure, you may not have a normal bowel movement for a few days.  Please Note:  You might notice some irritation and congestion in your nose or some drainage.  This is from the oxygen used during your procedure.  There is no need for concern and it should clear up in a day or so.  SYMPTOMS TO REPORT IMMEDIATELY:   Following lower endoscopy (colonoscopy or flexible sigmoidoscopy):  Excessive amounts of blood in the stool  Significant tenderness or worsening of abdominal pains  Swelling of the abdomen that is new, acute  Fever of 100F or higher   For urgent or emergent issues, a gastroenterologist can be reached at any hour by calling [phone].   DIET:  We do recommend a small meal at first, but then you may proceed to your regular diet.  Drink plenty of fluids but you should avoid alcoholic beverages for 24 hours.  ACTIVITY:  You should plan to take it easy for the rest of today and you should NOT DRIVE or use heavy machinery until tomorrow (because of the sedation medicines used during the test).     FOLLOW UP: Our staff will call the number listed on your records the next business day following your procedure to check on you and address any questions or concerns that you may have regarding the information given to you following your procedure. If we do not reach you, we will leave a message.  However, if you are feeling well and you are not experiencing any problems, there is no need to return our call.  We will assume that you have returned to your regular daily activities without incident.  If any biopsies were taken you will be contacted by phone or by letter within the next 1-3 weeks.  Please call us at [phone] if you have not heard about the biopsies in 3 weeks.    SIGNATURES/CONFIDENTIALITY: You and/or your care partner have signed paperwork which will be entered into your electronic medical record.  These signatures attest to the fact that that the information above on your After Visit Summary has been reviewed and is understood.  Full responsibility of the confidentiality of this discharge information lies with you and/or your care-partner.  Polyp and diverticulosis information given,.

## 2018-01-05 NOTE — Op Note (Signed)
[facility] Patient Name: Brandi Stephens Procedure Date: 01/05/2018 11:37 AM MRN: [id number] Endoscopist: Brandi Stephens , MD Age: 72 Referring MD:  Date of Birth: 09-Nov-1945 Gender: Female Account #: [id number] Procedure:                Colonoscopy Indications:              Surveillance: Personal history of adenomatous                            polyps on last colonoscopy 5 years ago Medicines:                Monitored Anesthesia Care Procedure:                Pre-Anesthesia Assessment:                           - Prior to the procedure, a History and Physical                            was performed, and patient medications and                            allergies were reviewed. The patient's tolerance of                            previous anesthesia was also reviewed. The risks                            and benefits of the procedure and the sedation                            options and risks were discussed with the patient.                            All questions were answered, and informed consent                            was obtained. Prior Anticoagulants: The patient has                            taken no previous anticoagulant or antiplatelet                            agents. ASA Grade Assessment: III - A patient with                            severe systemic disease. After reviewing the risks                            and benefits, the patient was deemed in                            satisfactory condition to undergo the procedure.  After obtaining informed consent, the colonoscope                            was passed under direct vision. Throughout the                            procedure, the patient's blood pressure, pulse, and                            oxygen saturations were monitored continuously. The                            Colonoscope was introduced through the anus and                            advanced to the  the ileocolonic anastomosis. The                            colonoscopy was performed without difficulty. The                            patient tolerated the procedure well. The quality                            of the bowel preparation was adequate. The terminal                            ileum and the rectum, surgical anastomosis were                            photographed. Scope In: 11:42:51 AM Scope Out: 12:15:22 PM Scope Withdrawal Time: 0 hours 22 minutes 57 seconds  Total Procedure Duration: 0 hours 32 minutes 31 seconds  Findings:                 Skin tags were found on perianal exam.                           The terminal ileum appeared normal.                           There was evidence of a prior end-to-end                            ileo-colonic anastomosis in the ascending colon.                            This was patent and was characterized by healthy                            appearing mucosa.                           Two sessile polyps were found in the transverse  colon. The polyps were diminutive in size. These                            polyps were removed with a cold biopsy forceps.                            Resection and retrieval were complete.                           Four sessile polyps were found in the transverse                            colon. The polyps were 3 to 4 mm in size. These                            polyps were removed with a cold snare. Resection                            and retrieval were complete.                           A 4 mm polyp was found in the descending colon. The                            polyp was sessile. The polyp was removed with a                            cold snare. Resection and retrieval were complete.                           Two sessile polyps were found in the sigmoid colon.                            The polyps were 3 to 7 mm in size. These polyps                            were  removed with a cold snare. Resection and                            retrieval were complete.                           Two sessile polyps were found in the rectum. The                            polyps were diminutive in size. These polyps were                            removed with a cold snare. Resection and retrieval                            were complete.  Multiple small and large-mouthed diverticula were                            found in the sigmoid colon.                           Anal papilla(e) were hypertrophied and large.                            Biopsies were taken with a cold forceps for                            histology to ensure no dysplastic changes.                           The colon was tortuous.                           The exam was otherwise without abnormality. Complications:            No immediate complications. Estimated blood loss:                            Minimal. Estimated Blood Loss:     Estimated blood loss was minimal. Impression:               - Perianal skin tags found on perianal exam.                           - The examined portion of the ileum was normal.                           - Patent end-to-end ileo-colonic anastomosis,                            characterized by healthy appearing mucosa.                           - Two diminutive polyps in the transverse colon,                            removed with a cold biopsy forceps. Resected and                            retrieved.                           - Four 3 to 4 mm polyps in the transverse colon,                            removed with a cold snare. Resected and retrieved.                           - Two 3 to 7 mm polyps in the sigmoid colon,  removed with a cold snare. Resected and retrieved.                           - Two diminutive polyps in the rectum, removed with                            a cold snare. Resected and  retrieved.                           - Diverticulosis in the sigmoid colon.                           - Anal papilla(e) were hypertrophied. Biopsied.                           - Tortuous colon.                           - The examination was otherwise normal. Recommendation:           - Patient has a contact number available for                            emergencies. The signs and symptoms of potential                            delayed complications were discussed with the                            patient. Return to normal activities tomorrow.                            Written discharge instructions were provided to the                            patient.                           - Resume previous diet.                           - Continue present medications.                           - Await pathology results.                           - Repeat colonoscopy for surveillance based on                            pathology results. Brandi Lipps P. Brandi Sitts, MD 01/05/2018 12:25:54 PM This report has been signed electronically.

## 2018-01-05 NOTE — Progress Notes (Signed)
Called to room to assist during endoscopic procedure.  Patient ID and intended procedure confirmed with present staff. Received instructions for my participation in the procedure from the performing physician.  

## 2018-01-08 ENCOUNTER — Telehealth: Payer: Self-pay

## 2018-01-08 ENCOUNTER — Telehealth: Payer: Self-pay | Admitting: *Deleted

## 2018-01-08 DIAGNOSIS — M533 Sacrococcygeal disorders, not elsewhere classified: Secondary | ICD-10-CM | POA: Diagnosis not present

## 2018-01-08 NOTE — Telephone Encounter (Signed)
Left message on answering machine. 

## 2018-01-08 NOTE — Telephone Encounter (Signed)
First follow up call attempt.  Voicemail with name identifier.  Message left to call if any questions or concerns. 

## 2018-01-09 ENCOUNTER — Other Ambulatory Visit: Payer: Self-pay | Admitting: Family Medicine

## 2018-01-10 NOTE — Telephone Encounter (Signed)
Is she seeing neurology at some point? I am ok to refill but would appreciate their input on how long she should stay on this medication.

## 2018-01-10 NOTE — Telephone Encounter (Signed)
Pt has an appt with neurology in December and will ask them about how long she should be on me

## 2018-01-10 NOTE — Telephone Encounter (Signed)
Is this okay to refill? 

## 2018-01-18 ENCOUNTER — Telehealth: Payer: Self-pay | Admitting: Gastroenterology

## 2018-01-18 NOTE — Telephone Encounter (Signed)
Looked at referral in system, it is still under review. Let patient know this, I also gave her the phone number to contact [facility] about scheduling an appointment.

## 2018-01-31 DIAGNOSIS — M4800 Spinal stenosis, site unspecified: Secondary | ICD-10-CM | POA: Diagnosis not present

## 2018-02-06 ENCOUNTER — Telehealth: Payer: Self-pay | Admitting: Nurse Practitioner

## 2018-02-06 ENCOUNTER — Other Ambulatory Visit: Payer: Self-pay | Admitting: Neurosurgery

## 2018-02-06 DIAGNOSIS — IMO0002 Reserved for concepts with insufficient information to code with codable children: Secondary | ICD-10-CM

## 2018-02-06 DIAGNOSIS — M4800 Spinal stenosis, site unspecified: Principal | ICD-10-CM

## 2018-02-06 NOTE — Telephone Encounter (Signed)
Phone call to patient to verify medication list and allergies for myelogram procedure. Pt instructed to hold buspar and tramadol for 48 hrs prior to myelogram appointment time. Pt verbalized understanding.

## 2018-02-12 ENCOUNTER — Ambulatory Visit: Payer: Self-pay | Admitting: General Surgery

## 2018-02-12 DIAGNOSIS — K6282 Dysplasia of anus: Secondary | ICD-10-CM | POA: Diagnosis not present

## 2018-02-12 NOTE — H&P (Signed)
History of Present Illness Leighton Ruff MD; 25/95/6387 3:37 PM) The patient is a 72 year old female who presents with a complaint of anal problems. 72 year old female who presents to the office after colonoscopy showed his total rectal lesions. One with these was biopsies and showed AIN grade 1. Patient reports a long-standing history of constipation and hemorrhoid issues. She has had a hemorrhoidectomy in the past. She is currently on Linzess and is having good success with this.   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 Hemorrhoidectomy Hysterectomy (not due to cancer) - Partial Mammoplasty; Reduction Bilateral. Tonsillectomy  Diagnostic Studies History Leighton Ruff, MD; 04/26/3233 3:38 PM) Colonoscopy 1-5 years ago Mammogram within last year Pap Smear 1-5 years ago  Allergies (Tanisha A. Owens Shark, [place]; 02/12/2018 3:21 PM) Ampicillin *PENICILLINS* Codeine Sulfate *ANALGESICS - OPIOID* Hydrocodone-Acetaminophen *ANALGESICS - OPIOID* Hydromorph *ANALGESICS - OPIOID* Penicillin G Sodium *PENICILLINS* Allergies Reconciled  Medication History (Tanisha A. Owens Shark, [place]; 02/12/2018 3:21 PM) Baclofen (10MG  Tablet, Oral) Active. Vitamin D3 (1000UNIT Tablet, Oral) Active. Anastrozole (1MG  Tablet, Oral) Active. TraMADol HCl (50MG  Tablet, Oral) Active. Primidone (50MG  Tablet, Oral) Active. Levothyroxine Sodium (75MCG Tablet, Oral) Active. Valsartan-Hydrochlorothiazide (320-12.5MG  Tablet, Oral) Active. Lasix (20MG  Tablet, Oral) Active. Allopurinol (100MG  Tablet, 200mg  Oral) Active. ROPINIRole HCl (0.5MG  Tablet, Oral) Active. Eszopiclone (3MG  Tablet, Oral) Active. busPIRone HCl (15MG  Tablet, Oral)  Active. Medications Reconciled  Social History Leighton Ruff, MD; 57/32/2025 3:38 PM) Alcohol use Moderate alcohol use. Caffeine use Coffee. No drug use Tobacco use Former smoker.  Family History Leighton Ruff, MD; 42/70/6237 3:38 PM) Alcohol Abuse Daughter, Father, Mother, Son. Arthritis Father, Mother. Cancer Mother, Sister. Colon Polyps Mother. Depression Daughter. Heart Disease Mother. Hypertension Mother.  Pregnancy / Birth History Leighton Ruff, MD; 62/83/1517 3:38 PM) Age at menarche 71 years. Age of menopause 51-55 Contraceptive History Intrauterine device, Oral contraceptives. Gravida 6 Maternal age 38-25 Para 42  Other Problems Leighton Ruff, MD; 61/60/7371 3:38 PM) Anxiety Disorder Arthritis Back Pain Chronic Obstructive Lung Disease Chronic Renal Failure Syndrome Hemorrhoids High blood pressure Hypercholesterolemia Melanoma Seizure Disorder Thyroid Disease     Review of Systems Leighton Ruff MD; 10/12/9483 3:38 PM) General Not Present- Appetite Loss, Chills, Fatigue, Fever, Night Sweats, Weight Gain and Weight Loss. Skin Not Present- Change in Wart/Mole, Dryness, Hives, Jaundice, New Lesions, Non-Healing Wounds, Rash and Ulcer. HEENT Present- Seasonal Allergies. Not Present- Earache, Hearing Loss, Hoarseness, Nose Bleed, Oral Ulcers, Ringing in the Ears, Sinus Pain, Sore Throat, Visual Disturbances, Wears glasses/contact lenses and Yellow Eyes. Respiratory Not Present- Bloody sputum, Chronic Cough, Difficulty Breathing, Snoring and Wheezing. Breast Present- Breast Pain. Not Present- Breast Mass, Nipple Discharge and Skin Changes. Cardiovascular Present- Swelling of Extremities. Not Present- Chest Pain, Difficulty Breathing Lying Down, Leg Cramps, Palpitations, Rapid Heart Rate and Shortness of Breath. Gastrointestinal Not Present- Abdominal Pain, Bloating, Bloody Stool, Change in Bowel Habits, Chronic diarrhea,  Constipation, Difficulty Swallowing, Excessive gas, Gets full quickly at meals, Hemorrhoids, Indigestion, Nausea, Rectal Pain and Vomiting. Female Genitourinary Present- Frequency. Not Present- Nocturia, Painful Urination, Pelvic Pain and Urgency. Musculoskeletal Present- Back Pain, Joint Pain, Joint Stiffness and Swelling of Extremities. Not Present- Muscle Pain and Muscle Weakness. Neurological Present- Numbness and Tremor. Not Present- Decreased Memory, Fainting, Headaches, Seizures, Tingling, Trouble walking and Weakness. Psychiatric Present- Anxiety. Not Present- Bipolar, Change in  Sleep Pattern, Depression, Fearful and Frequent crying. Endocrine Present- Heat Intolerance. Not Present- Cold Intolerance, Excessive Hunger, Hair Changes, Hot flashes and New Diabetes. Hematology Present- Gland problems. Not Present- Easy Bruising, Excessive bleeding, HIV and Persistent Infections. All other systems negative  Vitals (Tanisha A. Brown RMA; 02/12/2018 3:21 PM) 02/12/2018 3:20 PM Weight: 239 lb Height: 68in Body Surface Area: 2.2 m Body Mass Index: 36.34 kg/m  Temp.: 98.8F  BP: 136/86 (Sitting, Left Arm, Standard)      Physical Exam Leighton Ruff MD; 29/92/4268 3:38 PM)  General Mental Status-Alert. General Appearance-Not in acute distress. Build & Nutrition-Well nourished. Posture-Normal posture. Gait-Normal.  Head and Neck Head-normocephalic, atraumatic with no lesions or palpable masses. Trachea-midline.  Chest and Lung Exam Chest and lung exam reveals -on auscultation, normal breath sounds, no adventitious sounds and normal vocal resonance.  Cardiovascular Cardiovascular examination reveals -normal heart sounds, regular rate and rhythm with no murmurs and no digital clubbing, cyanosis, edema, increased warmth or tenderness.  Abdomen Inspection Inspection of the abdomen reveals - No Hernias. Palpation/Percussion Palpation and Percussion of  the abdomen reveal - Soft, Non Tender, No Rigidity (guarding), No hepatosplenomegaly and No Palpable abdominal masses.  Neurologic Neurologic evaluation reveals -alert and oriented x 3 with no impairment of recent or remote memory, normal attention span and ability to concentrate, normal sensation and normal coordination.  Musculoskeletal Normal Exam - Bilateral-Upper Extremity Strength Normal and Lower Extremity Strength Normal.    Assessment & Plan Leighton Ruff MD; 34/19/6222 3:37 PM)  AIN GRADE I (L79.89) Impression: 72 year old female who presents to the office for evaluation of AIN seen on her most recent colonoscopy. We have discussed typical treatments for AIN including high-resolution anoscopy versus anoscopy surveillance in the office. She would like to proceed with high-resolution anoscopy in the near future. We discussed the need for multiple biopsies and the typical postoperative pain and recovery. We discussed the typical risk of AIN transforming into anal cancer. We discussed possible etiologies for development of this.

## 2018-02-12 NOTE — H&P (View-Only) (Signed)
History of Present Illness Leighton Ruff MD; 54/27/0623 3:37 PM) The patient is a 72 year old female who presents with a complaint of anal problems. 72 year old female who presents to the office after colonoscopy showed his total rectal lesions. One with these was biopsies and showed AIN grade 1. Patient reports a long-standing history of constipation and hemorrhoid issues. She has had a hemorrhoidectomy in the past. She is currently on Linzess and is having good success with this.   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 Hemorrhoidectomy Hysterectomy (not due to cancer) - Partial Mammoplasty; Reduction Bilateral. Tonsillectomy  Diagnostic Studies History Leighton Ruff, MD; 94/85/4627 3:38 PM) Colonoscopy 1-5 years ago Mammogram within last year Pap Smear 1-5 years ago  Allergies (Tanisha A. Owens Shark, [place]; 02/12/2018 3:21 PM) Ampicillin *PENICILLINS* Codeine Sulfate *ANALGESICS - OPIOID* Hydrocodone-Acetaminophen *ANALGESICS - OPIOID* Hydromorph *ANALGESICS - OPIOID* Penicillin G Sodium *PENICILLINS* Allergies Reconciled  Medication History (Tanisha A. Owens Shark, [place]; 02/12/2018 3:21 PM) Baclofen (10MG  Tablet, Oral) Active. Vitamin D3 (1000UNIT Tablet, Oral) Active. Anastrozole (1MG  Tablet, Oral) Active. TraMADol HCl (50MG  Tablet, Oral) Active. Primidone (50MG  Tablet, Oral) Active. Levothyroxine Sodium (75MCG Tablet, Oral) Active. Valsartan-Hydrochlorothiazide (320-12.5MG  Tablet, Oral) Active. Lasix (20MG  Tablet, Oral) Active. Allopurinol (100MG  Tablet, 200mg  Oral) Active. ROPINIRole HCl (0.5MG  Tablet, Oral) Active. Eszopiclone (3MG  Tablet, Oral) Active. busPIRone HCl (15MG  Tablet, Oral)  Active. Medications Reconciled  Social History Leighton Ruff, MD; 03/50/0938 3:38 PM) Alcohol use Moderate alcohol use. Caffeine use Coffee. No drug use Tobacco use Former smoker.  Family History Leighton Ruff, MD; 18/29/9371 3:38 PM) Alcohol Abuse Daughter, Father, Mother, Son. Arthritis Father, Mother. Cancer Mother, Sister. Colon Polyps Mother. Depression Daughter. Heart Disease Mother. Hypertension Mother.  Pregnancy / Birth History Leighton Ruff, MD; 69/67/8938 3:38 PM) Age at menarche 89 years. Age of menopause 51-55 Contraceptive History Intrauterine device, Oral contraceptives. Gravida 6 Maternal age 54-25 Para 8  Other Problems Leighton Ruff, MD; 02/01/5101 3:38 PM) Anxiety Disorder Arthritis Back Pain Chronic Obstructive Lung Disease Chronic Renal Failure Syndrome Hemorrhoids High blood pressure Hypercholesterolemia Melanoma Seizure Disorder Thyroid Disease     Review of Systems Leighton Ruff MD; 58/52/7782 3:38 PM) General Not Present- Appetite Loss, Chills, Fatigue, Fever, Night Sweats, Weight Gain and Weight Loss. Skin Not Present- Change in Wart/Mole, Dryness, Hives, Jaundice, New Lesions, Non-Healing Wounds, Rash and Ulcer. HEENT Present- Seasonal Allergies. Not Present- Earache, Hearing Loss, Hoarseness, Nose Bleed, Oral Ulcers, Ringing in the Ears, Sinus Pain, Sore Throat, Visual Disturbances, Wears glasses/contact lenses and Yellow Eyes. Respiratory Not Present- Bloody sputum, Chronic Cough, Difficulty Breathing, Snoring and Wheezing. Breast Present- Breast Pain. Not Present- Breast Mass, Nipple Discharge and Skin Changes. Cardiovascular Present- Swelling of Extremities. Not Present- Chest Pain, Difficulty Breathing Lying Down, Leg Cramps, Palpitations, Rapid Heart Rate and Shortness of Breath. Gastrointestinal Not Present- Abdominal Pain, Bloating, Bloody Stool, Change in Bowel Habits, Chronic diarrhea,  Constipation, Difficulty Swallowing, Excessive gas, Gets full quickly at meals, Hemorrhoids, Indigestion, Nausea, Rectal Pain and Vomiting. Female Genitourinary Present- Frequency. Not Present- Nocturia, Painful Urination, Pelvic Pain and Urgency. Musculoskeletal Present- Back Pain, Joint Pain, Joint Stiffness and Swelling of Extremities. Not Present- Muscle Pain and Muscle Weakness. Neurological Present- Numbness and Tremor. Not Present- Decreased Memory, Fainting, Headaches, Seizures, Tingling, Trouble walking and Weakness. Psychiatric Present- Anxiety. Not Present- Bipolar, Change in  Sleep Pattern, Depression, Fearful and Frequent crying. Endocrine Present- Heat Intolerance. Not Present- Cold Intolerance, Excessive Hunger, Hair Changes, Hot flashes and New Diabetes. Hematology Present- Gland problems. Not Present- Easy Bruising, Excessive bleeding, HIV and Persistent Infections. All other systems negative  Vitals (Tanisha A. Brown RMA; 02/12/2018 3:21 PM) 02/12/2018 3:20 PM Weight: 239 lb Height: 68in Body Surface Area: 2.2 m Body Mass Index: 36.34 kg/m  Temp.: 98.50F  BP: 136/86 (Sitting, Left Arm, Standard)      Physical Exam Leighton Ruff MD; 28/76/8115 3:38 PM)  General Mental Status-Alert. General Appearance-Not in acute distress. Build & Nutrition-Well nourished. Posture-Normal posture. Gait-Normal.  Head and Neck Head-normocephalic, atraumatic with no lesions or palpable masses. Trachea-midline.  Chest and Lung Exam Chest and lung exam reveals -on auscultation, normal breath sounds, no adventitious sounds and normal vocal resonance.  Cardiovascular Cardiovascular examination reveals -normal heart sounds, regular rate and rhythm with no murmurs and no digital clubbing, cyanosis, edema, increased warmth or tenderness.  Abdomen Inspection Inspection of the abdomen reveals - No Hernias. Palpation/Percussion Palpation and Percussion of  the abdomen reveal - Soft, Non Tender, No Rigidity (guarding), No hepatosplenomegaly and No Palpable abdominal masses.  Neurologic Neurologic evaluation reveals -alert and oriented x 3 with no impairment of recent or remote memory, normal attention span and ability to concentrate, normal sensation and normal coordination.  Musculoskeletal Normal Exam - Bilateral-Upper Extremity Strength Normal and Lower Extremity Strength Normal.    Assessment & Plan Leighton Ruff MD; 72/62/0355 3:37 PM)  AIN GRADE I (H74.16) Impression: 72 year old female who presents to the office for evaluation of AIN seen on her most recent colonoscopy. We have discussed typical treatments for AIN including high-resolution anoscopy versus anoscopy surveillance in the office. She would like to proceed with high-resolution anoscopy in the near future. We discussed the need for multiple biopsies and the typical postoperative pain and recovery. We discussed the typical risk of AIN transforming into anal cancer. We discussed possible etiologies for development of this.

## 2018-02-19 NOTE — Discharge Instructions (Signed)
Myelogram Discharge Instructions  1. Go home and rest quietly for the next 24 hours.  It is important to lie flat for the next 24 hours.  Get up only to go to the restroom.  You may lie in the bed or on a couch on your back, your stomach, your left side or your right side.  You may have one pillow under your head.  You may have pillows between your knees while you are on your side or under your knees while you are on your back.  2. DO NOT drive today.  Recline the seat as far back as it will go, while still wearing your seat belt, on the way home.  3. You may get up to go to the bathroom as needed.  You may sit up for 10 minutes to eat.  You may resume your normal diet and medications unless otherwise indicated.  Drink lots of extra fluids today and tomorrow.  4. The incidence of headache, nausea, or vomiting is about 5% (one in 20 patients).  If you develop a headache, lie flat and drink plenty of fluids until the headache goes away.  Caffeinated beverages may be helpful.  If you develop severe nausea and vomiting or a headache that does not go away with flat bed rest, call [phone].  5. You may resume normal activities after your 24 hours of bed rest is over; however, do not exert yourself strongly or do any heavy lifting tomorrow. If when you get up you have a headache when standing, go back to bed and force fluids for another 24 hours.  6. Call your physician for a follow-up appointment.  The results of your myelogram will be sent directly to your physician by the following day.  7. If you have any questions or if complications develop after you arrive home, please call [phone].  Discharge instructions have been explained to the patient.  The patient, or the person responsible for the patient, fully understands these instructions.  YOU MAY RESTART YOUR BUSPAR AND TRAMADOL TOMORROW 02/21/2018 AT 10:30AM.

## 2018-02-20 ENCOUNTER — Ambulatory Visit
Admission: RE | Admit: 2018-02-20 | Discharge: 2018-02-20 | Disposition: A | Discharge status: Home / Self Care | Payer: PPO | Source: Ambulatory Visit | Attending: Neurosurgery | Admitting: Neurosurgery

## 2018-02-20 DIAGNOSIS — M4800 Spinal stenosis, site unspecified: Principal | ICD-10-CM

## 2018-02-20 DIAGNOSIS — IMO0002 Reserved for concepts with insufficient information to code with codable children: Secondary | ICD-10-CM

## 2018-02-20 DIAGNOSIS — M48061 Spinal stenosis, lumbar region without neurogenic claudication: Secondary | ICD-10-CM | POA: Diagnosis not present

## 2018-02-20 MED ORDER — ONDANSETRON HCL 4 MG/2ML IJ SOLN
4.0000 mg | Freq: Once | INTRAMUSCULAR | Status: AC
  Administered 2018-02-20: 4 mg via INTRAMUSCULAR

## 2018-02-20 MED ORDER — DIAZEPAM 5 MG PO TABS
5.0000 mg | ORAL_TABLET | Freq: Once | ORAL | Status: AC
  Administered 2018-02-20: 5 mg via ORAL

## 2018-02-20 MED ORDER — MEPERIDINE HCL 100 MG/ML IJ SOLN
75.0000 mg | Freq: Once | INTRAMUSCULAR | Status: AC
  Administered 2018-02-20: 75 mg via INTRAMUSCULAR

## 2018-02-20 MED ORDER — IOPAMIDOL (ISOVUE-M 200) INJECTION 41%
15.0000 mL | Freq: Once | INTRAMUSCULAR | Status: AC
  Administered 2018-02-20: 15 mL via INTRATHECAL

## 2018-02-21 ENCOUNTER — Encounter (HOSPITAL_BASED_OUTPATIENT_CLINIC_OR_DEPARTMENT_OTHER): Payer: Self-pay | Admitting: *Deleted

## 2018-02-22 DIAGNOSIS — Z6836 Body mass index (BMI) 36.0-36.9, adult: Secondary | ICD-10-CM | POA: Diagnosis not present

## 2018-02-22 DIAGNOSIS — I10 Essential (primary) hypertension: Secondary | ICD-10-CM | POA: Diagnosis not present

## 2018-02-22 DIAGNOSIS — M4800 Spinal stenosis, site unspecified: Secondary | ICD-10-CM | POA: Diagnosis not present

## 2018-02-26 ENCOUNTER — Encounter (HOSPITAL_BASED_OUTPATIENT_CLINIC_OR_DEPARTMENT_OTHER): Payer: Self-pay

## 2018-02-26 ENCOUNTER — Other Ambulatory Visit: Payer: Self-pay

## 2018-02-26 NOTE — Progress Notes (Signed)
No BP/IV Left arm Spoke with:  Kennedee NPO:  After Midnight, no gum, candy, or mints   Arrival time: 0730AM Labs: Istat 8, EKG AM medications: Allopurinol, Anastrazole, Atorvastatin, Levothyroxine Pre op orders: Yes Ride home:  [name] (daughter) [phone]

## 2018-02-27 ENCOUNTER — Other Ambulatory Visit: Payer: Self-pay | Admitting: Neurosurgery

## 2018-02-27 NOTE — Anesthesia Preprocedure Evaluation (Addendum)
Anesthesia Evaluation  Patient identified by MRN, date of birth, ID band Patient awake    Reviewed: Allergy & Precautions, NPO status , Patient's Chart, lab work & pertinent test results, reviewed documented beta blocker date and time   History of Anesthesia Complications Negative for: history of anesthetic complications  Airway Mallampati: II  TM Distance: >3 FB Neck ROM: Full    Dental no notable dental hx. (+) Partial Lower   Pulmonary COPD, former smoker,    Pulmonary exam normal breath sounds clear to auscultation       Cardiovascular hypertension, Pt. on medications and Pt. on home beta blockers Normal cardiovascular exam Rhythm:Regular Rate:Normal     Neuro/Psych Anxiety negative neurological ROS     GI/Hepatic Neg liver ROS, GERD  ,  Endo/Other  Hypothyroidism   Renal/GU negative Renal ROS     Musculoskeletal  (+) Arthritis , Osteoarthritis,    Abdominal   Peds  Hematology negative hematology ROS (+)   Anesthesia Other Findings Day of surgery medications reviewed with the patient.  Reproductive/Obstetrics                           Anesthesia Physical Anesthesia Plan  ASA: II  Anesthesia Plan: MAC   Post-op Pain Management:    Induction:   PONV Risk Score and Plan: 2 and Propofol infusion, Ondansetron and Treatment may vary due to age or medical condition  Airway Management Planned: Natural Airway and Nasal Cannula  Additional Equipment:   Intra-op Plan:   Post-operative Plan:   Informed Consent: I have reviewed the patients History and Physical, chart, labs and discussed the procedure including the risks, benefits and alternatives for the proposed anesthesia with the patient or authorized representative who has indicated his/her understanding and acceptance.     Plan Discussed with: CRNA  Anesthesia Plan Comments:        Anesthesia Quick Evaluation

## 2018-02-28 ENCOUNTER — Other Ambulatory Visit: Payer: Self-pay

## 2018-02-28 ENCOUNTER — Ambulatory Visit (HOSPITAL_BASED_OUTPATIENT_CLINIC_OR_DEPARTMENT_OTHER): Payer: PPO | Admitting: Anesthesiology

## 2018-02-28 ENCOUNTER — Ambulatory Visit (HOSPITAL_BASED_OUTPATIENT_CLINIC_OR_DEPARTMENT_OTHER)
Admission: RE | Admit: 2018-02-28 | Discharge: 2018-02-28 | Disposition: A | Discharge status: Home / Self Care | Payer: PPO | Source: Ambulatory Visit | Attending: General Surgery | Admitting: General Surgery

## 2018-02-28 ENCOUNTER — Encounter (HOSPITAL_BASED_OUTPATIENT_CLINIC_OR_DEPARTMENT_OTHER): Payer: Self-pay | Admitting: Emergency Medicine

## 2018-02-28 ENCOUNTER — Encounter (HOSPITAL_BASED_OUTPATIENT_CLINIC_OR_DEPARTMENT_OTHER)
Admission: RE | Disposition: A | Discharge status: Home / Self Care | Payer: Self-pay | Source: Ambulatory Visit | Attending: General Surgery

## 2018-02-28 DIAGNOSIS — I10 Essential (primary) hypertension: Secondary | ICD-10-CM | POA: Insufficient documentation

## 2018-02-28 DIAGNOSIS — K219 Gastro-esophageal reflux disease without esophagitis: Secondary | ICD-10-CM | POA: Insufficient documentation

## 2018-02-28 DIAGNOSIS — J449 Chronic obstructive pulmonary disease, unspecified: Secondary | ICD-10-CM | POA: Diagnosis not present

## 2018-02-28 DIAGNOSIS — Z79899 Other long term (current) drug therapy: Secondary | ICD-10-CM | POA: Insufficient documentation

## 2018-02-28 DIAGNOSIS — F419 Anxiety disorder, unspecified: Secondary | ICD-10-CM | POA: Diagnosis not present

## 2018-02-28 DIAGNOSIS — Z87891 Personal history of nicotine dependence: Secondary | ICD-10-CM | POA: Insufficient documentation

## 2018-02-28 DIAGNOSIS — K6282 Dysplasia of anus: Secondary | ICD-10-CM | POA: Diagnosis not present

## 2018-02-28 DIAGNOSIS — E039 Hypothyroidism, unspecified: Secondary | ICD-10-CM | POA: Diagnosis not present

## 2018-02-28 HISTORY — DX: Polyneuropathy, unspecified: G62.9

## 2018-02-28 HISTORY — DX: Other constipation: K59.09

## 2018-02-28 HISTORY — DX: Other seasonal allergic rhinitis: J30.2

## 2018-02-28 HISTORY — DX: Personal history of other infectious and parasitic diseases: Z86.19

## 2018-02-28 HISTORY — DX: Personal history of other malignant neoplasm of skin: Z85.828

## 2018-02-28 HISTORY — DX: Dysplasia of anus: K62.82

## 2018-02-28 HISTORY — DX: Carcinoma in situ of anus and anal canal: D01.3

## 2018-02-28 HISTORY — PX: HIGH RESOLUTION ANOSCOPY: SHX6345

## 2018-02-28 HISTORY — DX: Other specified forms of tremor: G25.2

## 2018-02-28 HISTORY — DX: Other specified postprocedural states: Z98.890

## 2018-02-28 HISTORY — DX: Estrogen receptor positive status (ER+): Z17.0

## 2018-02-28 HISTORY — DX: Cardiac arrhythmia, unspecified: I49.9

## 2018-02-28 HISTORY — DX: Unspecified convulsions: R56.9

## 2018-02-28 HISTORY — DX: Presence of dental prosthetic device (complete) (partial): Z97.2

## 2018-02-28 HISTORY — DX: Spinal stenosis, lumbar region without neurogenic claudication: M48.061

## 2018-02-28 HISTORY — DX: Malignant neoplasm of upper-inner quadrant of left female breast: Z17.0

## 2018-02-28 HISTORY — DX: Malignant neoplasm of upper-inner quadrant of left female breast: C50.212

## 2018-02-28 HISTORY — DX: Personal history of adenomatous and serrated colon polyps: Z86.0101

## 2018-02-28 HISTORY — DX: Heart disease, unspecified: I51.9

## 2018-02-28 HISTORY — DX: Personal history of colonic polyps: Z86.010

## 2018-02-28 LAB — POCT I-STAT, CHEM 8
BUN: 29 mg/dL — ABNORMAL HIGH (ref 8–23)
CALCIUM ION: 1.26 mmol/L (ref 1.15–1.40)
CHLORIDE: 106 mmol/L (ref 98–111)
Creatinine, Ser: 1.3 mg/dL — ABNORMAL HIGH (ref 0.44–1.00)
GLUCOSE: 127 mg/dL — AB (ref 70–99)
HCT: 37 % (ref 36.0–46.0)
HEMOGLOBIN: 12.6 g/dL (ref 12.0–15.0)
POTASSIUM: 4.5 mmol/L (ref 3.5–5.1)
SODIUM: 140 mmol/L (ref 135–145)
TCO2: 24 mmol/L (ref 22–32)

## 2018-02-28 SURGERY — ANOSCOPY, HIGH RESOLUTION
Anesthesia: Monitor Anesthesia Care | Site: Anus

## 2018-02-28 MED ORDER — SODIUM CHLORIDE 0.9 % IV SOLN
250.0000 mL | INTRAVENOUS | Status: DC | PRN
  Filled 2018-02-28: qty 250

## 2018-02-28 MED ORDER — PROMETHAZINE HCL 25 MG/ML IJ SOLN
6.2500 mg | INTRAMUSCULAR | Status: DC | PRN
  Filled 2018-02-28: qty 1

## 2018-02-28 MED ORDER — PROPOFOL 500 MG/50ML IV EMUL
INTRAVENOUS | Status: DC | PRN
  Administered 2018-02-28: 200 ug/kg/min via INTRAVENOUS

## 2018-02-28 MED ORDER — ACETAMINOPHEN 10 MG/ML IV SOLN
1000.0000 mg | Freq: Once | INTRAVENOUS | Status: DC | PRN
  Filled 2018-02-28: qty 100

## 2018-02-28 MED ORDER — MIDAZOLAM HCL 2 MG/2ML IJ SOLN
INTRAMUSCULAR | Status: DC | PRN
  Administered 2018-02-28: 2 mg via INTRAVENOUS

## 2018-02-28 MED ORDER — BUPIVACAINE-EPINEPHRINE 0.5% -1:200000 IJ SOLN
INTRAMUSCULAR | Status: DC | PRN
  Administered 2018-02-28: 30 mL

## 2018-02-28 MED ORDER — MIDAZOLAM HCL 2 MG/2ML IJ SOLN
INTRAMUSCULAR | Status: AC
  Filled 2018-02-28: qty 2

## 2018-02-28 MED ORDER — ACETAMINOPHEN 650 MG RE SUPP
650.0000 mg | RECTAL | Status: DC | PRN
  Filled 2018-02-28: qty 1

## 2018-02-28 MED ORDER — LACTATED RINGERS IV SOLN
INTRAVENOUS | Status: DC
  Administered 2018-02-28: 09:00:00 via INTRAVENOUS
  Filled 2018-02-28: qty 1000

## 2018-02-28 MED ORDER — SODIUM CHLORIDE 0.9% FLUSH
3.0000 mL | INTRAVENOUS | Status: DC | PRN
  Filled 2018-02-28: qty 3

## 2018-02-28 MED ORDER — PROPOFOL 500 MG/50ML IV EMUL
INTRAVENOUS | Status: AC
  Filled 2018-02-28: qty 50

## 2018-02-28 MED ORDER — LIDOCAINE 2% (20 MG/ML) 5 ML SYRINGE
INTRAMUSCULAR | Status: DC | PRN
  Administered 2018-02-28: 25 mg via INTRAVENOUS

## 2018-02-28 MED ORDER — LIDOCAINE 2% (20 MG/ML) 5 ML SYRINGE
INTRAMUSCULAR | Status: AC
  Filled 2018-02-28: qty 5

## 2018-02-28 MED ORDER — FENTANYL CITRATE (PF) 100 MCG/2ML IJ SOLN
25.0000 ug | INTRAMUSCULAR | Status: DC | PRN
  Filled 2018-02-28: qty 1

## 2018-02-28 MED ORDER — ACETAMINOPHEN 500 MG PO TABS
1000.0000 mg | ORAL_TABLET | ORAL | Status: AC
  Administered 2018-02-28: 1000 mg via ORAL
  Filled 2018-02-28: qty 2

## 2018-02-28 MED ORDER — ACETAMINOPHEN 325 MG PO TABS
650.0000 mg | ORAL_TABLET | ORAL | Status: DC | PRN
  Filled 2018-02-28: qty 2

## 2018-02-28 MED ORDER — KETAMINE HCL 10 MG/ML IJ SOLN
INTRAMUSCULAR | Status: DC | PRN
  Administered 2018-02-28: 10 mg via INTRAVENOUS
  Administered 2018-02-28: 20 mg via INTRAVENOUS

## 2018-02-28 MED ORDER — SODIUM CHLORIDE 0.9% FLUSH
3.0000 mL | Freq: Two times a day (BID) | INTRAVENOUS | Status: DC
  Filled 2018-02-28: qty 3

## 2018-02-28 MED ORDER — ACETIC ACID 5 % SOLN
Status: DC | PRN
  Administered 2018-02-28: 1 via TOPICAL

## 2018-02-28 MED ORDER — ONDANSETRON HCL 4 MG/2ML IJ SOLN
INTRAMUSCULAR | Status: AC
  Filled 2018-02-28: qty 2

## 2018-02-28 MED ORDER — ACETAMINOPHEN 500 MG PO TABS
ORAL_TABLET | ORAL | Status: AC
  Filled 2018-02-28: qty 2

## 2018-02-28 SURGICAL SUPPLY — 36 items
APL SKNCLS STERI-STRIP NONHPOA (GAUZE/BANDAGES/DRESSINGS) ×1
BENZOIN TINCTURE PRP APPL 2/3 (GAUZE/BANDAGES/DRESSINGS) ×3 IMPLANT
BLADE EXTENDED COATED 6.5IN (ELECTRODE) IMPLANT
BLADE HEX COATED 2.75 (ELECTRODE) ×3 IMPLANT
BLADE SURG 10 STRL SS (BLADE) ×3 IMPLANT
BRIEF STRETCH FOR OB PAD LRG (UNDERPADS AND DIAPERS) ×3 IMPLANT
CANISTER SUCT 3000ML PPV (MISCELLANEOUS) ×3 IMPLANT
COVER BACK TABLE 60X90IN (DRAPES) ×3 IMPLANT
COVER WAND RF STERILE (DRAPES) ×6 IMPLANT
DECANTER SPIKE VIAL GLASS SM (MISCELLANEOUS) ×1 IMPLANT
DRAPE LAPAROTOMY 100X72 PEDS (DRAPES) ×3 IMPLANT
DRAPE UTILITY XL STRL (DRAPES) ×3 IMPLANT
ELECT REM PT RETURN 9FT ADLT (ELECTROSURGICAL) ×3
ELECTRODE REM PT RTRN 9FT ADLT (ELECTROSURGICAL) ×1 IMPLANT
GAUZE SPONGE 4X4 12PLY STRL (GAUZE/BANDAGES/DRESSINGS) ×2 IMPLANT
GLOVE BIO SURGEON STRL SZ 6.5 (GLOVE) ×2 IMPLANT
GLOVE BIO SURGEONS STRL SZ 6.5 (GLOVE) ×1
KIT TURNOVER CYSTO (KITS) ×3 IMPLANT
NEEDLE HYPO 22GX1.5 SAFETY (NEEDLE) ×3 IMPLANT
NS IRRIG 500ML POUR BTL (IV SOLUTION) ×3 IMPLANT
PACK BASIN DAY SURGERY FS (CUSTOM PROCEDURE TRAY) ×3 IMPLANT
PAD ABD 8X10 STRL (GAUZE/BANDAGES/DRESSINGS) ×2 IMPLANT
PAD ARMBOARD 7.5X6 YLW CONV (MISCELLANEOUS) ×2 IMPLANT
PENCIL BUTTON HOLSTER BLD 10FT (ELECTRODE) ×3 IMPLANT
SPONGE SURGIFOAM ABS GEL 12-7 (HEMOSTASIS) IMPLANT
SUT CHROMIC 2 0 SH (SUTURE) ×2 IMPLANT
SUT CHROMIC 3 0 SH 27 (SUTURE) IMPLANT
SUT VIC AB 4-0 P-3 18XBRD (SUTURE) IMPLANT
SUT VIC AB 4-0 P3 18 (SUTURE)
SYR BULB IRRIGATION 50ML (SYRINGE) ×3 IMPLANT
SYR CONTROL 10ML LL (SYRINGE) ×3 IMPLANT
TOWEL OR 17X24 6PK STRL BLUE (TOWEL DISPOSABLE) ×3 IMPLANT
TRAY DSU PREP LF (CUSTOM PROCEDURE TRAY) ×3 IMPLANT
TUBE CONNECTING 12'X1/4 (SUCTIONS) ×1
TUBE CONNECTING 12X1/4 (SUCTIONS) ×2 IMPLANT
YANKAUER SUCT BULB TIP NO VENT (SUCTIONS) ×2 IMPLANT

## 2018-02-28 NOTE — Anesthesia Procedure Notes (Signed)
Procedure Name: MAC Date/Time: 02/28/2018 9:20 AM Performed by: Wanita Chamberlain, CRNA Pre-anesthesia Checklist: Patient identified, Emergency Drugs available, Suction available, Patient being monitored and Timeout performed Patient Re-evaluated:Patient Re-evaluated prior to induction Oxygen Delivery Method: Nasal cannula Induction Type: IV induction Placement Confirmation: breath sounds checked- equal and bilateral,  CO2 detector and positive ETCO2 Dental Injury: Teeth and Oropharynx as per pre-operative assessment

## 2018-02-28 NOTE — Op Note (Signed)
02/28/2018  9:54 AM  PATIENT:  Brandi Stephens  72 y.o. female  Patient Care Team: Girtha Rm, NP-C as PCP - General (Family Medicine) Magrinat, Virgie Dad, MD as Consulting Physician (Oncology) Stark Klein, MD as Consulting Physician (General Surgery) Kyung Rudd, MD as Consulting Physician (Radiation Oncology) [name], MD as Consulting Physician (Family Medicine) [name], MD as Consulting Physician (Neurosurgery) Griselda Miner, MD as Consulting Physician (Dermatology) Delice Bison, Charlestine Massed, NP as Nurse Practitioner (Hematology and Oncology) Marchia Bond, MD as Consulting Physician (Orthopedic Surgery) Clydell Hakim, MD as Consulting Physician (Anesthesiology)  PRE-OPERATIVE DIAGNOSIS:  AIN  POST-OPERATIVE DIAGNOSIS:  AIN  PROCEDURE:   HIGH RESOLUTION ANOSCOPY WITH BIOPSY   Surgeon(s): Leighton Ruff, MD  ASSISTANT: none   ANESTHESIA:   local and MAC  EBL: 24m  Total I/O In: -  Out: 5 [Blood:5]  SPECIMEN:  Source of Specimen:  R lateral anal canal  DISPOSITION OF SPECIMEN:  PATHOLOGY  COUNTS:  YES  PLAN OF CARE: Discharge to home after PACU  PATIENT DISPOSITION:  PACU - hemodynamically stable.  INDICATION: 72y.o. F with AIN seen on distal rectal biopsy during colonoscopy  OR FINDINGS: R lateral distal rectal mass ~273min size  DESCRIPTION: The patient was identified in the preoperative holding area and taken to the OR where they were laid prone on the operating room table in jack knife position. MAC anesthesia was smoothly induced.  The patient was then prepped and draped in the usual sterile fashion. A surgical timeout was performed indicating the correct patient, procedure, positioning and preoperative antibioitics. SCDs were noted to be in place and functioning prior to the operation.   After this was completed, a sponge was soaked in 5% acetic acid was placed over the perianal region. This was allowed to  soak for 2 minutes. The sponge was removed and the perianal region was evaluated with a colposcope.  There were no acetowhite lesions noted.  The internal anal canal was evaluated via anoscopy with a Hill-Ferguson anoscope.  There were no acetowhite lesions noted but there was a mass in the R lateral distal rectum.  This may have been the previous biopsy site.  I decided to remove it with scissors.  After this was completed, hemostasis was achieved with electrocautery and all of the biopsy sites were closed using a 2-0 chromic suture.  Additional Marcaine was applied.  A dressing was placed and the patient was awakened from anesthesia and sent to the PACU in stable condition.  All counts were correct.

## 2018-02-28 NOTE — Transfer of Care (Signed)
Immediate Anesthesia Transfer of Care Note  Patient: Brandi Stephens  Procedure(s) Performed: HIGH RESOLUTION ANOSCOPY WITH BIOPSY (N/A Anus)  Patient Location: PACU  Anesthesia Type:MAC  Level of Consciousness: awake, alert , oriented and patient cooperative  Airway & Oxygen Therapy: Patient Spontanous Breathing and Patient connected to nasal cannula oxygen  Post-op Assessment: Report given to RN and Post -op Vital signs reviewed and stable  Post vital signs: Reviewed and stable  Last Vitals:  Vitals Value Taken Time  BP 95/71 02/28/2018 10:01 AM  Temp 36.4 C 02/28/2018 10:00 AM  Pulse 96 02/28/2018 10:02 AM  Resp 18 02/28/2018 10:02 AM  SpO2 95 % 02/28/2018 10:02 AM  Vitals shown include unvalidated device data.  Last Pain:  Vitals:   02/28/18 0737  TempSrc: Oral  PainSc: 5       Patients Stated Pain Goal: 5 (34/19/37 9024)  Complications: No apparent anesthesia complications

## 2018-02-28 NOTE — Discharge Instructions (Addendum)
Beginning the day after surgery: ° °You may sit in a tub of warm water 2-3 times a day to relieve discomfort. ° °Eat a regular diet high in fiber.  Avoid foods that give you constipation or diarrhea.  Avoid foods that are difficult to digest, such as seeds, nuts, corn or popcorn. ° °Do not go any longer than 2 days without a bowel movement.  You may take a dose of Milk of Magnesia if you become constipated.   ° °Drink 6-8 glasses of water daily. ° °Walking is encouraged.  Avoid strenuous activity and heavy lifting for one month after surgery.   ° °Call the office if you have any questions or concerns.  Call immediately if you develop: ° °· Excessive rectal bleeding (more than a cup or passing large clots) °· Increased discomfort °· Fever greater than 100 F °· Difficulty urinating ° °Post Anesthesia Home Care Instructions ° °Activity: °Get plenty of rest for the remainder of the day. A responsible adult should stay with you for 24 hours following the procedure.  °For the next 24 hours, DO NOT: °-Drive a car °-Operate machinery °-Drink alcoholic beverages °-Take any medication unless instructed by your physician °-Make any legal decisions or sign important papers. ° °Meals: °Start with liquid foods such as gelatin or soup. Progress to regular foods as tolerated. Avoid greasy, spicy, heavy foods. If nausea and/or vomiting occur, drink only clear liquids until the nausea and/or vomiting subsides. Call your physician if vomiting continues. ° °Special Instructions/Symptoms: °Your throat may feel dry or sore from the anesthesia or the breathing tube placed in your throat during surgery. If this causes discomfort, gargle with warm salt water. The discomfort should disappear within 24 hours. ° °If you had a scopolamine patch placed behind your ear for the management of post- operative nausea and/or vomiting: ° °1. The medication in the patch is effective for 72 hours, after which it should be removed.  Wrap patch in a tissue  and discard in the trash. Wash hands thoroughly with soap and water. °2. You may remove the patch earlier than 72 hours if you experience unpleasant side effects which may include dry mouth, dizziness or visual disturbances. °3. Avoid touching the patch. Wash your hands with soap and water after contact with the patch. °  ° °

## 2018-02-28 NOTE — Interval H&P Note (Signed)
History and Physical Interval Note:  02/28/2018 8:26 AM  Brandi Stephens  has presented today for surgery, with the diagnosis of AIN  The various methods of treatment have been discussed with the patient and family. After consideration of risks, benefits and other options for treatment, the patient has consented to  Procedure(s): HIGH RESOLUTION ANOSCOPY WITH BIOPSY (N/A) as a surgical intervention .  The patient's history has been reviewed, patient examined, no change in status, stable for surgery.  I have reviewed the patient's chart and labs.  Questions were answered to the patient's satisfaction.     Rosario Adie, MD  [facility]

## 2018-02-28 NOTE — Anesthesia Postprocedure Evaluation (Signed)
Anesthesia Post Note  Patient: Brandi Stephens  Procedure(s) Performed: HIGH RESOLUTION ANOSCOPY WITH BIOPSY (N/A Anus)     Patient location during evaluation: PACU Anesthesia Type: MAC Level of consciousness: awake and alert Pain management: pain level controlled Vital Signs Assessment: post-procedure vital signs reviewed and stable Respiratory status: spontaneous breathing, nonlabored ventilation and respiratory function stable Cardiovascular status: blood pressure returned to baseline and stable Postop Assessment: no apparent nausea or vomiting Anesthetic complications: no    Last Vitals:  Vitals:   02/28/18 0737 02/28/18 1000  BP: (!) 141/98 95/71  Pulse: (!) 122 98  Resp: 18 18  Temp: (!) 36.3 C (!) 36.4 C  SpO2: 100% 94%    Last Pain:  Vitals:   02/28/18 1000  TempSrc:   PainSc: 0-No pain                 Brennan Bailey

## 2018-02-28 NOTE — Brief Op Note (Deleted)
02/28/2018  9:54 AM  PATIENT:  Brandi Stephens  72 y.o. female  Patient Care Team: Girtha Rm, NP-C as PCP - General (Family Medicine) Magrinat, Virgie Dad, MD as Consulting Physician (Oncology) Stark Klein, MD as Consulting Physician (General Surgery) Kyung Rudd, MD as Consulting Physician (Radiation Oncology) [name], MD as Consulting Physician (Family Medicine) [name], MD as Consulting Physician (Neurosurgery) Griselda Miner, MD as Consulting Physician (Dermatology) Delice Bison, Charlestine Massed, NP as Nurse Practitioner (Hematology and Oncology) Marchia Bond, MD as Consulting Physician (Orthopedic Surgery) Clydell Hakim, MD as Consulting Physician (Anesthesiology)  PRE-OPERATIVE DIAGNOSIS:  AIN  POST-OPERATIVE DIAGNOSIS:  AIN  PROCEDURE:   HIGH RESOLUTION ANOSCOPY WITH BIOPSY   Surgeon(s): Leighton Ruff, MD  ASSISTANT: none   ANESTHESIA:   local and MAC  EBL: 50m  Total I/O In: -  Out: 5 [Blood:5]  SPECIMEN:  Source of Specimen:  R lateral anal canal  DISPOSITION OF SPECIMEN:  PATHOLOGY  COUNTS:  YES  PLAN OF CARE: Discharge to home after PACU  PATIENT DISPOSITION:  PACU - hemodynamically stable.  INDICATION: 72y.o. F with AIN seen on distal rectal biopsy during colonoscopy  OR FINDINGS: R lateral distal rectal mass ~23min size  DESCRIPTION: The patient was identified in the preoperative holding area and taken to the OR where they were laid prone on the operating room table in jack knife position. MAC anesthesia was smoothly induced.  The patient was then prepped and draped in the usual sterile fashion. A surgical timeout was performed indicating the correct patient, procedure, positioning and preoperative antibioitics. SCDs were noted to be in place and functioning prior to the operation.   After this was completed, a sponge was soaked in 5% acetic acid was placed over the perianal region. This was allowed to soak for 2 minutes.  The sponge was removed and the perianal region was evaluated with a colposcope.  There were no acetowhite lesions noted.  The internal anal canal was evaluated via anoscopy with a Hill-Ferguson anoscope.  There were no acetowhite lesions noted but there was a mass in the R lateral distal rectum.  This may have been the previous biopsy site.  I decided to remove it with scissors.  After this was completed, hemostasis was achieved with electrocautery and all of the biopsy sites were closed using a 2-0 chromic suture.  Additional Marcaine was applied.  A dressing was placed and the patient was awakened from anesthesia and sent to the PACU in stable condition.  All counts were correct.

## 2018-03-01 ENCOUNTER — Encounter (HOSPITAL_BASED_OUTPATIENT_CLINIC_OR_DEPARTMENT_OTHER): Payer: Self-pay | Admitting: General Surgery

## 2018-03-08 ENCOUNTER — Other Ambulatory Visit: Payer: Self-pay | Admitting: Internal Medicine

## 2018-03-08 ENCOUNTER — Other Ambulatory Visit: Payer: Self-pay | Admitting: Family Medicine

## 2018-03-08 DIAGNOSIS — K5900 Constipation, unspecified: Secondary | ICD-10-CM

## 2018-04-01 ENCOUNTER — Other Ambulatory Visit: Payer: Self-pay | Admitting: Family Medicine

## 2018-04-02 ENCOUNTER — Other Ambulatory Visit: Payer: Self-pay | Admitting: Family Medicine

## 2018-04-02 NOTE — Telephone Encounter (Signed)
Ok but I recall that she is going to see her neurologist again to see if she should continue treatment with this. Please check.

## 2018-04-02 NOTE — Telephone Encounter (Signed)
Is this okay to refill? 

## 2018-04-02 NOTE — Telephone Encounter (Signed)
Pt states she sees neurology next month as she will also be having back surgery and just needs this refilled until her neuro appt

## 2018-04-02 NOTE — Telephone Encounter (Signed)
Ok to refill for her.

## 2018-04-03 ENCOUNTER — Ambulatory Visit (INDEPENDENT_AMBULATORY_CARE_PROVIDER_SITE_OTHER): Payer: PPO | Admitting: Family Medicine

## 2018-04-03 ENCOUNTER — Encounter: Payer: Self-pay | Admitting: Family Medicine

## 2018-04-03 VITALS — BP 124/80 | HR 110 | Temp 99.5°F | Resp 18 | Wt 240.4 lb

## 2018-04-03 DIAGNOSIS — R05 Cough: Secondary | ICD-10-CM | POA: Diagnosis not present

## 2018-04-03 DIAGNOSIS — R058 Other specified cough: Secondary | ICD-10-CM

## 2018-04-03 DIAGNOSIS — R509 Fever, unspecified: Secondary | ICD-10-CM | POA: Diagnosis not present

## 2018-04-03 MED ORDER — BACLOFEN 10 MG PO TABS: 10.0000 mg | ORAL_TABLET | Freq: Three times a day (TID) | ORAL | 0 refills | Status: DC | PRN

## 2018-04-03 MED ORDER — LEVOFLOXACIN 500 MG PO TABS: 500.0000 mg | ORAL_TABLET | Freq: Every day | ORAL | 0 refills | Status: DC

## 2018-04-03 MED ORDER — PROMETHAZINE-DM 6.25-15 MG/5ML PO SYRP: 5.0000 mL | ORAL_SOLUTION | Freq: Every evening | ORAL | 0 refills | Status: DC | PRN

## 2018-04-03 NOTE — Patient Instructions (Signed)
Take the antibiotic as prescribed.  Continue with Mucinex during the day and staying well-hydrated.  You may take the Promethazine DM at bedtime.  Be aware that this medication can be sedating.  If your fever continues or worsens, cough worsens or any new symptoms then you may need to be seen again.  Call me Friday and let me know how you are doing

## 2018-04-03 NOTE — Progress Notes (Signed)
Chief Complaint  Patient presents with  . sick    sick- cough, low grade fever, chills, and achy since last thursday. mucous coming up    Subjective:  Brandi Stephens is a 72 y.o. female who presents for a 6 day history of rhinorrhea, cough, body aches, low grade fever, chills, wheezing.  Diaphoretic.   Denies chest pain, palpitations, shortness off breath.   Treatment to date: Mucinex, Theraflu, Advil and Tylenol.  Denies sick contacts.  No other aggravating or relieving factors.  No other c/o.  ROS as in subjective.   Objective: Vitals:   04/03/18 1421 04/03/18 1446  BP: 124/80   Pulse: (!) 114 (!) 110  Resp: 16 18  Temp: 98 F (36.7 C) 99.5 F (37.5 C)  SpO2: 98% 95%    General appearance: Alert, WD/WN, no distress, mildly ill appearing                             Skin: warm, diaphoretic, no rash                           Head: no sinus tenderness                            Eyes: conjunctiva normal, corneas clear, PERRLA                            Ears: pearly TMs, external ear canals normal                          Nose: septum midline, turbinates swollen, with erythema and clear discharge             Mouth/throat: MMM, tongue normal, mild pharyngeal erythema                           Neck: supple, no adenopathy, no thyromegaly, nontender                          Heart: RRR, normal S1, S2, no murmurs                         Lungs: CTA bilaterally, mild faint expiratory wheezes, no rales, or rhonchi. No       Assessment: Productive cough - Plan: promethazine-dextromethorphan (PROMETHAZINE-DM) 6.25-15 MG/5ML syrup, levofloxacin (LEVAQUIN) 500 MG tablet  Low grade fever - Plan: levofloxacin (LEVAQUIN) 500 MG tablet    Plan: Discussed diagnosis and treatment of productive cough and fever, presumed pneumonia. Discussed sending her for a chest XR however that will not change the treatment plan.  Levaquin and Promethazine DM prescribed.   Suggested symptomatic OTC  remedies.Nasal saline spray for congestion.  Tylenol or Ibuprofen OTC for fever and malaise.  She will call me in 2 days to let me know how she is doing or return if worsening. Strict precautions due to immunocompromised state that if she develops a higher fever, worsening symptoms that she may need to go to the ED for further evaluation and treatment.

## 2018-04-06 ENCOUNTER — Ambulatory Visit: Payer: Self-pay | Admitting: Family Medicine

## 2018-04-12 ENCOUNTER — Telehealth: Payer: Self-pay | Admitting: Family Medicine

## 2018-04-12 NOTE — Telephone Encounter (Signed)
Thank her for the update. I'm glad she is improving. Hopefully she will continue to improve. She should let her surgeon know closer to the surgery date if she is still having issues with cough and congestion.

## 2018-04-12 NOTE — Telephone Encounter (Signed)
Patient called back to follow up on pneumonia  She takes last antibiotic today  She feels so much better but still a little weak Cough still bad when she lays down and early morning. And when sh talks  Mucous she gets up is yellow, beige in color but she feels like it is in her throat instead of her chest  No fever  Surgery 04/26/18

## 2018-04-12 NOTE — Telephone Encounter (Signed)
Pt was notified.  

## 2018-04-19 ENCOUNTER — Telehealth: Payer: Self-pay | Admitting: Internal Medicine

## 2018-04-19 DIAGNOSIS — R05 Cough: Secondary | ICD-10-CM

## 2018-04-19 DIAGNOSIS — R059 Cough, unspecified: Secondary | ICD-10-CM

## 2018-04-19 NOTE — Telephone Encounter (Signed)
Pt called and states that she finished antibitoic and was feeling better and then over the weekened she started having a low grade fever, congestion in her throat and yellow/beige mucous come up, and coughing. Her surgery is on 04/26/18 and she can not be having surgery if she has a cough still

## 2018-04-19 NOTE — Telephone Encounter (Signed)
Pt is 60-70% better than she was when she was seen for the 1st time. She will go get an chest xray in am and I put in a stat order for this.

## 2018-04-19 NOTE — Pre-Procedure Instructions (Signed)
Brandi Stephens  04/19/2018      [facility] [place], [place] [place] [address] Phone: [phone] Fax: [phone]    Your procedure is scheduled on January 9th.  Report to [facility] at 1030 A.M.  Call this number if you have problems the morning of surgery:  [phone]   Remember:  Do not eat or drink after midnight.    Take these medicines the morning of surgery with A SIP OF WATER  allopurinol (ZYLOPRIM) anastrozole (ARIMIDEX)  atorvastatin (LIPITOR) baclofen (LIORESAL)  busPIRone (BUSPAR) hydrOXYzine (VISTARIL) levofloxacin (LEVAQUIN)  levothyroxine (SYNTHROID, LEVOTHROID)  LINZESS  traMADol (ULTRAM)  If needed  7 days prior to surgery STOP taking any Aspirin (unless otherwise instructed by your surgeon), Aleve, Naproxen, Ibuprofen, Motrin, Advil, Goody's, BC's, all herbal medications, fish oil, and all vitamins.     Do not wear jewelry, make-up or nail polish.  Do not wear lotions, powders, or perfumes, or deodorant.  Do not shave 48 hours prior to surgery.  Men may shave face and neck.  Do not bring valuables to the hospital.  [facility] is not responsible for any belongings or valuables.  Contacts, dentures or bridgework may not be worn into surgery.  Leave your suitcase in the car.  After surgery it may be brought to your room.  For patients admitted to the hospital, discharge time will be determined by your treatment team.  Patients discharged the day of surgery will not be allowed to drive home.    [facility]- Preparing For Surgery  Before surgery, you can play an important role. Because skin is not sterile, your skin needs to be as free of germs as possible. You can reduce the number of germs on your skin by washing with CHG (chlorahexidine gluconate) Soap before surgery.  CHG is an antiseptic cleaner which kills germs and  bonds with the skin to continue killing germs even after washing.    Oral Hygiene is also important to reduce your risk of infection.  Remember - BRUSH YOUR TEETH THE MORNING OF SURGERY WITH YOUR REGULAR TOOTHPASTE  Please do not use if you have an allergy to CHG or antibacterial soaps. If your skin becomes reddened/irritated stop using the CHG.  Do not shave (including legs and underarms) for at least 48 hours prior to first CHG shower. It is OK to shave your face.  Please follow these instructions carefully.   1. Shower the NIGHT BEFORE SURGERY and the MORNING OF SURGERY with CHG.   2. If you chose to wash your hair, wash your hair first as usual with your normal shampoo.  3. After you shampoo, rinse your hair and body thoroughly to remove the shampoo.  4. Use CHG as you would any other liquid soap. You can apply CHG directly to the skin and wash gently with a scrungie or a clean washcloth.   5. Apply the CHG Soap to your body ONLY FROM THE NECK DOWN.  Do not use on open wounds or open sores. Avoid contact with your eyes, ears, mouth and genitals (private parts). Wash Face and genitals (private parts)  with your normal soap.  6. Wash thoroughly, paying special attention to the area where your surgery will be performed.  7. Thoroughly rinse your body with warm water from the neck down.  8. DO NOT shower/wash with your normal soap  after using and rinsing off the CHG Soap.  9. Pat yourself dry with a CLEAN TOWEL.  10. Wear CLEAN PAJAMAS to bed the night before surgery, wear comfortable clothes the morning of surgery  11. Place CLEAN SHEETS on your bed the night of your first shower and DO NOT SLEEP WITH PETS.    Day of Surgery:  Do not apply any deodorants/lotions.  Please wear clean clothes to the hospital/surgery center.   Remember to brush your teeth WITH YOUR REGULAR TOOTHPASTE.    Please read over the following fact sheets that you were given.

## 2018-04-19 NOTE — Telephone Encounter (Signed)
Please have her go get a chest XR. We may need to put her back on the antibiotic.

## 2018-04-20 ENCOUNTER — Encounter

## 2018-04-20 ENCOUNTER — Other Ambulatory Visit (HOSPITAL_COMMUNITY): Payer: PPO

## 2018-04-20 ENCOUNTER — Encounter (HOSPITAL_COMMUNITY)
Admission: RE | Admit: 2018-04-20 | Discharge: 2018-04-20 | Disposition: A | Discharge status: Home / Self Care | Payer: PPO | Source: Ambulatory Visit | Attending: Neurosurgery | Admitting: Neurosurgery

## 2018-04-20 ENCOUNTER — Other Ambulatory Visit: Payer: Self-pay

## 2018-04-20 ENCOUNTER — Encounter: Payer: Self-pay | Admitting: Neurology

## 2018-04-20 ENCOUNTER — Other Ambulatory Visit: Payer: Self-pay | Admitting: Family Medicine

## 2018-04-20 ENCOUNTER — Ambulatory Visit
Admission: RE | Admit: 2018-04-20 | Discharge: 2018-04-20 | Disposition: A | Discharge status: Home / Self Care | Payer: PPO | Source: Ambulatory Visit | Attending: Family Medicine | Admitting: Family Medicine

## 2018-04-20 ENCOUNTER — Other Ambulatory Visit: Payer: Self-pay | Admitting: Neurosurgery

## 2018-04-20 ENCOUNTER — Encounter (HOSPITAL_COMMUNITY): Payer: Self-pay

## 2018-04-20 ENCOUNTER — Ambulatory Visit (INDEPENDENT_AMBULATORY_CARE_PROVIDER_SITE_OTHER): Payer: PPO | Admitting: Neurology

## 2018-04-20 VITALS — BP 110/78 | HR 110 | Ht 66.5 in | Wt 240.4 lb

## 2018-04-20 DIAGNOSIS — G2581 Restless legs syndrome: Secondary | ICD-10-CM

## 2018-04-20 DIAGNOSIS — Z01812 Encounter for preprocedural laboratory examination: Secondary | ICD-10-CM | POA: Diagnosis not present

## 2018-04-20 DIAGNOSIS — R05 Cough: Secondary | ICD-10-CM

## 2018-04-20 DIAGNOSIS — G629 Polyneuropathy, unspecified: Secondary | ICD-10-CM

## 2018-04-20 DIAGNOSIS — R059 Cough, unspecified: Secondary | ICD-10-CM

## 2018-04-20 DIAGNOSIS — G250 Essential tremor: Secondary | ICD-10-CM

## 2018-04-20 LAB — CBC
HCT: 44.3 % (ref 36.0–46.0)
Hemoglobin: 14.3 g/dL (ref 12.0–15.0)
MCH: 33.2 pg (ref 26.0–34.0)
MCHC: 32.3 g/dL (ref 30.0–36.0)
MCV: 102.8 fL — ABNORMAL HIGH (ref 80.0–100.0)
NRBC: 0 % (ref 0.0–0.2)
Platelets: 277 10*3/uL (ref 150–400)
RBC: 4.31 MIL/uL (ref 3.87–5.11)
RDW: 14 % (ref 11.5–15.5)
WBC: 12.1 10*3/uL — ABNORMAL HIGH (ref 4.0–10.5)

## 2018-04-20 LAB — TYPE AND SCREEN
ABO/RH(D): A POS
Antibody Screen: NEGATIVE

## 2018-04-20 LAB — SURGICAL PCR SCREEN
MRSA, PCR: NEGATIVE
STAPHYLOCOCCUS AUREUS: NEGATIVE

## 2018-04-20 LAB — BASIC METABOLIC PANEL
Anion gap: 5 (ref 5–15)
BUN: 24 mg/dL — ABNORMAL HIGH (ref 8–23)
CO2: 24 mmol/L (ref 22–32)
Calcium: 9.4 mg/dL (ref 8.9–10.3)
Chloride: 112 mmol/L — ABNORMAL HIGH (ref 98–111)
Creatinine, Ser: 1.36 mg/dL — ABNORMAL HIGH (ref 0.44–1.00)
GFR calc Af Amer: 45 mL/min — ABNORMAL LOW (ref >60)
GFR calc non Af Amer: 39 mL/min — ABNORMAL LOW (ref >60)
Glucose, Bld: 123 mg/dL — ABNORMAL HIGH (ref 70–99)
Potassium: 4.2 mmol/L (ref 3.5–5.1)
Sodium: 141 mmol/L (ref 135–145)

## 2018-04-20 MED ORDER — LEVOFLOXACIN 500 MG PO TABS: 500.0000 mg | ORAL_TABLET | Freq: Every day | ORAL | 0 refills | Status: DC

## 2018-04-20 MED ORDER — ROPINIROLE HCL 0.5 MG PO TABS: 0.7500 mg | ORAL_TABLET | Freq: Every day | ORAL | 3 refills | Status: DC

## 2018-04-20 NOTE — Progress Notes (Signed)
PCP - Dr. Mack Hook  Cardiologist - Denies  Chest x-ray - 04/20/2018 (E)  EKG - 02/28/18 (E)  Stress Test - 01/2016 (E)  ECHO - 01/2016 (E)  Cardiac Cath - Denies  AICD- na PM- na LOOP- na  Sleep Study - Denies CPAP - None  LABS- 04/20/2018: CBC, BMP, T/S, PCR  ASA- Denies    Anesthesia- No  Pt denies having chest pain, sob, or fever at this time. All instructions explained to the pt, with a verbal understanding of the material. Pt agrees to go over the instructions while at home for a better understanding. The opportunity to ask questions was provided.

## 2018-04-20 NOTE — Patient Instructions (Addendum)
Increase Requip 0.75mg  at bedtime  Continue primidone 50mg  daily  Return to clinic in 6 months

## 2018-04-20 NOTE — Progress Notes (Signed)
Follow-up Visit   Date: 04/20/18    Brandi Stephens MRN: [id number] DOB: Dec 08, 1945   Interim History: Brandi Stephens is a 73 y.o. right-handed Caucasian female with hypertension, COPD, left breast cancer (2017), hypothyroidism, essential tremor, lumbar spondylosis s/p L4-S1 PLIF (03/2015) and chronic low back pain returning to the clinic for follow-up of tremors, RLS, and neuropathy.  The patient was accompanied to the clinic by daughter who also provides collateral information.    History of present illness: In December 2016 she had low back surgery and since this time, she had numbness involving the toes and balls of the feet which is worse in the left.  She also complains of burning and tingling pain in the feet which wakes her up from sleeping and is less apparent during the day. She denies any weakness of the feet, imbalance, and walks unassisted.  She does not wish to be on any medication for her painful paresthesias.  She had no history of diabetes, alcoholism, or chemotherapy. Her recent TSH, vitamin B12, and HbA1c was normal.  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  She takes requip 0.25mg  for RLS and primidone 25mg  at bedtime for tremors which are not controlling her symptoms well.  Her mother also has history of essential tremor.    UPDATE 04/20/2018:  She was last seen in May 2018 for tremors, RLS, and neuropathy.  Since this time, she has been struggling with low back pain and missed her prior visit.  She has severe lumbar canal stenosis at L3-4 and is scheduled to have lumbar surgery next week by Dr. Kathyrn Sheriff.  Her neuropathy seems to be doing better, which she attributes to using a medical masseuse.  RLS is worse and wakes her up about 3 nights of the week, having to walk around for relief.  She takes ropinirole 0.5mg  at bedtime, no side effects.   Tremors are stable on  primidone 50mg  daily.    Medications:  Current Outpatient Medications on File Prior to Visit  Medication Sig Dispense Refill  . allopurinol (ZYLOPRIM) 100 MG tablet TAKE 2 TABLETS BY MOUTH EVERY DAY (Patient taking differently: Take 200 mg by mouth daily. ) 180 tablet 0  . anastrozole (ARIMIDEX) 1 MG tablet TAKE ONE TABLET BY MOUTH DAILY (Patient taking differently: Take 1 mg by mouth daily. ) 90 tablet 2  . atorvastatin (LIPITOR) 20 MG tablet Take 1 tablet (20 mg total) by mouth daily. 90 tablet 0  . baclofen (LIORESAL) 10 MG tablet Take 1 tablet (10 mg total) by mouth 3 (three) times daily as needed. (Patient taking differently: Take 10 mg by mouth at bedtime. ) 90 tablet 0  . Biotin (BIOTIN 5000) 5 MG CAPS Take 5 mg by mouth daily.     . busPIRone (BUSPAR) 15 MG tablet Take 15 mg by mouth 3 (three) times daily.     . Calcium Carbonate-Vitamin D (CALCIUM 500/D PO) Take 2 each by mouth daily.     . colchicine 0.6 MG tablet Take 2 tablets now and 1 tablet an hour later for flare up (Patient taking differently: Take 0.6 mg by mouth daily as needed (for gout flare up). ) 6 tablet 0  . Eszopiclone (ESZOPICLONE) 3 MG TABS Take 3 mg by mouth at bedtime. Take immediately before bedtime    . furosemide (LASIX) 20 MG tablet Take 1 tablet (20  mg total) by mouth daily as needed. Reported on 10/28/2015 (Patient taking differently: Take 20 mg by mouth daily as needed for fluid. ) 90 tablet 1  . hydrOXYzine (VISTARIL) 25 MG capsule Take 25 mg by mouth 2 (two) times daily as needed for anxiety.    Marland Kitchen ibuprofen (ADVIL,MOTRIN) 200 MG tablet Take 400-600 mg by mouth every 6 (six) hours as needed for headache or moderate pain.    Marland Kitchen levofloxacin (LEVAQUIN) 500 MG tablet Take 1 tablet (500 mg total) by mouth daily. 10 tablet 0  . levothyroxine (SYNTHROID, LEVOTHROID) 75 MCG tablet TAKE 1 TABLET BY MOUTH EVERY DAY (Patient taking differently: Take 75 mcg by mouth daily before breakfast. ) 90 tablet 1  . LINZESS 145 MCG  CAPS capsule TAKE 1 CAPSULE BY MOUTH EVERY DAY BEFORE BREAKFAST (Patient taking differently: Take 145 mcg by mouth daily before breakfast. ) 90 capsule 1  . Magnesium 500 MG CAPS Take 500 mg by mouth daily.    . Melatonin 3 MG CAPS Take 9 mg by mouth at bedtime.    . metoprolol succinate (TOPROL-XL) 25 MG 24 hr tablet Take 0.5 tablets (12.5 mg total) by mouth every evening. 45 tablet 0  . olmesartan-hydrochlorothiazide (BENICAR HCT) 40-12.5 MG tablet TAKE 1 TABLET BY MOUTH EVERY DAY (Patient taking differently: Take 1 tablet by mouth daily. ) 90 tablet 1  . polyethylene glycol powder (MIRALAX) powder Take 17 g by mouth daily as needed for moderate constipation.     . potassium chloride (KLOR-CON 10) 10 MEQ tablet Take 1 tablet (10 mEq total) by mouth as needed. Reported on 10/28/2015 (Patient taking differently: Take 10 mEq by mouth daily as needed (with Lasix). ) 90 tablet 1  . primidone (MYSOLINE) 50 MG tablet TAKE 1 TABLET(50 MG) BY MOUTH AT BEDTIME (Patient taking differently: Take 50 mg by mouth at bedtime. ) 90 tablet 0  . promethazine-dextromethorphan (PROMETHAZINE-DM) 6.25-15 MG/5ML syrup Take 5 mLs by mouth at bedtime as needed for cough. 118 mL 0  . traMADol (ULTRAM) 50 MG tablet TAKE ONE TABLET BY MOUTH EVERY 6 HOURS AS NEEDED (Patient taking differently: Take 50 mg by mouth every 6 (six) hours as needed for moderate pain. ) 90 tablet 0  . vitamin B-12 (CYANOCOBALAMIN) 1000 MCG tablet Take 1,000 mcg by mouth daily.     No current facility-administered medications on file prior to visit.     Allergies:  Allergies  Allergen Reactions  . Ampicillin Hives and Other (See Comments)    Severe reaction in February 2017 1.5 month to have hives to go away  . Penicillins Other (See Comments)    Has patient had a PCN reaction causing immediate rash, facial/tongue/throat swelling, SOB or lightheadedness with hypotension: No Has patient had a PCN reaction causing severe rash involving mucus  membranes or skin necrosis: No Has patient had a PCN reaction that required hospitalization No Has patient had a PCN reaction occurring within the last 10 years: No If all of the above answers are "NO", then may proceed with Cephalosporin use.   . Codeine Nausea And Vomiting  . Hydrocodone-Acetaminophen Itching  . Hydromorphone Itching    Pt has taken w/o issues  . Morphine And Related Itching  . Other Itching    Analgesics    Review of Systems:  CONSTITUTIONAL: No fevers, chills, night sweats, or weight loss.  EYES: No visual changes or eye pain ENT: No hearing changes.  No history of nose bleeds.   RESPIRATORY: No cough,  wheezing and shortness of breath.   CARDIOVASCULAR: Negative for chest pain, and palpitations.   GI: Negative for abdominal discomfort, blood in stools or black stools.  No recent change in bowel habits.   GU:  No history of incontinence.   MUSCLOSKELETAL: +history of joint pain or swelling.  No myalgias.   SKIN: Negative for lesions, rash, and itching.   ENDOCRINE: Negative for cold or heat intolerance, polydipsia or goiter.   PSYCH:  No depression or anxiety symptoms.   NEURO: As Above.   Vital Signs:  BP 110/78   Pulse (!) 110   Ht 5' 6.5" (1.689 m)   Wt 240 lb 6 oz (109 kg)   SpO2 90%   BMI 38.22 kg/m   General Medical Exam:   General:  Well appearing, comfortable  Eyes/ENT: see cranial nerve examination.   Neck:  No carotid bruits. Respiratory:  Clear to auscultation, good air entry bilaterally.   Cardiac:  Regular rate and rhythm, no murmur.   Ext:  No edema   Neurological Exam: MENTAL STATUS including orientation to time, place, person, recent and remote memory, attention span and concentration, language, and fund of knowledge is normal.  Speech is not dysarthric.  CRANIAL NERVES:  Pupils equal round and reactive to light.  Normal conjugate, extra-ocular eye movements in all directions of gaze.  No ptosis.  Face is symmetric. Palate elevates  symmetrically.  Tongue is midline.  MOTOR:  Motor strength is 5/5 in all extremities.  Mild left > right intention tremor of the hands.  No pronator drift.    MSRs:  Reflexes are 2+/4 throughout, except absent bilateral Achilles.  SENSORY:  Vibration is intact at the ankles, temperature is reduced in the lower legs and feet.  COORDINATION/GAIT:  Gait appears antalgic, stooped, unassisted and stable.  Data: CT myelogram 02/20/2018: Status post L4-S1 PLIF.  Severe stenosis, near complete block L3-L4, adjacent segment disease. Dynamic instability, with 2 mm of anterolisthesis with patient standing in flexion.  CT LUMBAR MYELOGRAM IMPRESSION: Critical stenosis at L3-4 secondary to short pedicles, central disc extrusion and posterior element hypertrophy. Minor contribution from additional 2 mm anterolisthesis. BILATERAL L4 and L3 neural impingement. Progression of stenosis since 2017.  Status post L4-5 PLIF. Solid interbody arthrodesis is not established at this level.  No adverse features observed at L5-S1. Moderate spinal stenosis L2-3, secondary to annular bulge/subligamentous protrusion and posterior element hypertrophy, with BILATERAL L3 nerve root effacement.   IMPRESSION/PLAN: 1.  Restless leg syndrome, worsening with sleep interruptions about 3 times per week. She is tolerating the medication well without side effects, so will increase ropinirole to 0.75mg  at bedtime.   2.  Benign essential tremor, stable on primidone 50mg  daily 3.  Idiopathic peripheral neuropathy affecting the feet, improved with nontraditional therapies. 4.  Multilevel lumbar canal stenosis, severe at L3-4, and bilateral L3 and L4 foraminal stenosis, followed by Dr. Kathyrn Sheriff with surgery planned for next week  Return to clinic in 6 months   Thank you for allowing me to participate in patient's care.  If I can answer any additional questions, I would be pleased to do so.    Sincerely,    Donnesha Karg K.  Posey Pronto, DO

## 2018-04-20 NOTE — Pre-Procedure Instructions (Signed)
Demoni ARIAUNA FARABEE  04/20/2018    Your procedure is scheduled on Thursday, April 26, 2018 at 12:30 PM.   Report to [facility] Entrance "A" Admitting Office at 10:30 AM.   Call this number if you have problems the morning of surgery: [phone]   Questions prior to day of surgery, please call [phone] between 8 & 4 PM.   Remember:  Do not eat or drink after midnight Wednesday, 04/25/2018  Take these medicines the morning of surgery with A SIP OF WATER: Allopurinol (Zyloprim), Anastrozole (Arimidex), Buspirone (Buspar), Levothyroxine (Synthroid), Linzess, Hydroxyzine (Vistaril) - if needed, Baclofen - if needed, Tramadol - if needed  Stop Herbal medications and NSAIDS (Ibuprofen, Aleve, etc) as of today. Do not use Aspirin products (Goody's, BC Powders, etc) prior to surgery.    Do not wear jewelry, make-up or nail polish.  Do not wear lotions, powders, or perfumes or deodorant.  Do not shave 48 hours prior to surgery.    Do not bring valuables to the hospital.  [facility] is not responsible for any belongings or valuables.  Contacts, dentures or bridgework may not be worn into surgery.  Leave your suitcase in the car.  After surgery it may be brought to your room.  For patients admitted to the hospital, discharge time will be determined by your treatment team.  [facility] - Preparing for Surgery  Before surgery, you can play an important role.  Because skin is not sterile, your skin needs to be as free of germs as possible.  You can reduce the number of germs on you skin by washing with CHG (chlorahexidine gluconate) soap before surgery.  CHG is an antiseptic cleaner which kills germs and bonds with the skin to continue killing germs even after washing.  Oral Hygiene is also important in reducing the risk of infection.  Remember to brush your teeth with your regular toothpaste the morning of surgery.  Please DO NOT use if you have an allergy to CHG or antibacterial soaps.   If your skin becomes reddened/irritated stop using the CHG and inform your nurse when you arrive at Short Stay.  Do not shave (including legs and underarms) for at least 48 hours prior to the first CHG shower.  You may shave your face.  Please follow these instructions carefully:   1.  Shower with CHG Soap the night before surgery and the morning of Surgery.  2.  If you choose to wash your hair, wash your hair first as usual with your normal shampoo.  3.  After you shampoo, rinse your hair and body thoroughly to remove the shampoo. 4.  Use CHG as you would any other liquid soap.  You can apply chg directly to the skin and wash gently with a      scrungie or washcloth.           5.  Apply the CHG Soap to your body ONLY FROM THE NECK DOWN.   Do not use on open wounds or open sores. Avoid contact with your eyes, ears, mouth and genitals (private parts).  Wash genitals (private parts) with your normal soap.  6.  Wash thoroughly, paying special attention to the area where your surgery will be performed.  7.  Thoroughly rinse your body with warm water from the neck down.  8.  DO NOT shower/wash with your normal soap after using and rinsing off the CHG Soap.  9.  Pat yourself dry with a clean towel.  10.  Wear clean pajamas.            11.  Place clean sheets on your bed the night of your first shower and do not sleep with pets.  Day of Surgery  Shower as above. Do not apply any lotions/deodorants the morning of surgery.   Please wear clean clothes to the hospital. Remember to brush your teeth with toothpaste.   Please read over the fact sheets that you were given.

## 2018-04-25 MED ORDER — VANCOMYCIN HCL 10 G IV SOLR
1500.0000 mg | INTRAVENOUS | Status: AC
  Administered 2018-04-26: 1500 mg via INTRAVENOUS
  Filled 2018-04-25: qty 1500

## 2018-04-25 NOTE — H&P (Addendum)
Chief Complaint   Back pain  HPI   HPI: Brandi Stephens is a 73 y.o. female with recurrent low back and bilateral lower extremity pain for roughly the past year. She is approx 3-4 years s/p L4-5 and L5-S1 decompression.   Pain is worsened with ambulation.  She has failed conservative treatment today including p.o. medications, epidural injections and physical therapy.  An MRI was ordered showing severe lumbar stenosis at L3-4.     She presents today for surgery.  Unfortunately, she has had signs/symptoms of pneumonia and has just had her last dose of PO abx yesterday. She does continue to have some productive cough.  Patient Active Problem List   Diagnosis Date Noted  . Hypothyroidism 11/30/2017  . Thoracic aortic atherosclerosis ([place]) 09/14/2016  . Benign essential tremor 09/09/2016  . RLS (restless legs syndrome) 09/09/2016  . H/O adenomatous polyp of colon 07/06/2016  . PVC's (premature ventricular contractions) 03/29/2016  . Preoperative cardiovascular examination 03/29/2016  . Palpitations 01/11/2016  . Shortness of breath 01/11/2016  . Other fatigue 01/11/2016  . Former cigarette smoker 01/11/2016  . Tachycardia 01/11/2016  . Leg edema 01/11/2016  . 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 Malignant neoplasm of upper-inner quadrant of left breast in female, estrogen receptor positive ([place]) 07/14/2015  . Prediabetes 07/08/2015  . Chronic kidney disease 07/08/2015  . Dependent edema 06/08/2015  . Sleep disturbance 06/08/2015  . Hyperlipidemia 06/08/2015  . Generalized anxiety disorder 06/08/2015  . Essential hypertension 06/08/2015  . Chronic obstructive pulmonary disease ([place]) 06/08/2015  . Gout 06/08/2015  . Macrocytosis   . 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 Lumbar spondylosis 03/20/2015  . Paresthesias 04/01/2013    PMH: Past Medical History:  Diagnosis Date  . AIN (anal intraepithelial neoplasia) anal canal   . Anemia    with pregnancy  . Anxiety     . Arthritis    knees, lower back, knees  . Chronic constipation   . Coarse tremors    Essential  . COPD (chronic obstructive pulmonary disease) ([place])    2017 chest CT, history of  . Degenerative lumbar spinal stenosis   . GERD (gastroesophageal reflux disease)    pt denies  . Gout   . Grade I diastolic dysfunction 60/01/9322   Noted ECHO  . Hemorrhoids   . History of adenomatous polyp of colon   . History of basal cell carcinoma (BCC) excision 2015   nose  . History of sepsis 05/14/2014   post lumbar surgery  . History of shingles    x2  . Hyperlipidemia   . Hypertension   . Hypothyroidism   . Irregular heart beat    history of while undergoing radiation  . Malignant neoplasm of upper-inner quadrant of left breast in female, estrogen receptor positive [facility]) oncologist-  dr Jana Hakim--  per lov in epic no recurrence   dx 07-13-2015  Left breast invasive ductal carcinoma, Stage IA, Grade 1 (TXN0),  09-16-2015  s/p  left breast lumpectomy with sln bx's,  completed radiation 11-18-2015,  started antiestrogen therapy 12-14-2015  . Neuropathy    Left foot and back of left leg  . Personal history of radiation therapy    completed 11-18-2015  left breast  . PONV (postoperative nausea and vomiting)   . Prediabetes    diet controlled, no med  . Restless leg syndrome   . Seasonal allergies   . Seizure ([place]) 05/2015   due to sepsis, only one time episode  . Wears partial  dentures    lower     PSH: Past Surgical History:  Procedure Laterality Date  . ABDOMINAL HYSTERECTOMY  1985  . BASAL CELL CARCINOMA EXCISION  10/15   nose  . BREAST BIOPSY Right 08/24/2015  . BREAST BIOPSY Right 07/13/2015  . BREAST BIOPSY Left 07/13/2015  . BREAST EXCISIONAL BIOPSY Left 1972  . BREAST LUMPECTOMY WITH RADIOACTIVE SEED AND SENTINEL LYMPH NODE BIOPSY Left 09/16/2015   Procedure: BREAST LUMPECTOMY WITH RADIOACTIVE SEED AND SENTINEL LYMPH NODE BIOPSY;  Surgeon: Stark Klein, MD;  Location:  [facility];  Service: General;  Laterality: Left;  . BREAST REDUCTION SURGERY Bilateral 1998  . CATARACT EXTRACTION W/ INTRAOCULAR LENS IMPLANT Left 2016  . [place]   hallo  . COLONOSCOPY  01/2013   polyps  . EYE SURGERY Left 2016  . HAND SURGERY Left 02-19-2002   dr Fredna Dow  @[facility]    repair collateral ligament/ MPJ left thumb  . [place]  2008  . HIGH RESOLUTION ANOSCOPY N/A 02/28/2018   Procedure: HIGH RESOLUTION ANOSCOPY WITH BIOPSY;  Surgeon: Leighton Ruff, MD;  Location: [facility];  Service: General;  Laterality: N/A;  . KNEE ARTHROSCOPY Right 2002  . LUMBAR SPINE SURGERY  03-20-2015   dr Kathyrn Sheriff   fusion L4-5, L5-S1  . MULTIPLE TOOTH EXTRACTIONS     with bone grafting lower bottom right and left  . RIGHT COLECTOMY  09-10-2003   dr Margot Chimes @[facility]    multiple colon polyps (per path tubular adenoma's, hyperplastic , benign appendix, benign two lymph nodes  . ROTATOR CUFF REPAIR Left 04/2016  . TONSILLECTOMY  1969  . TUBAL LIGATION Bilateral 1982    No medications prior to admission.    SH: Social History   Tobacco Use  . Smoking status: Current Some Day Smoker    Packs/day: 0.50    Years: 35.00    Pack years: 17.50    Types: Cigarettes  . Smokeless tobacco: Never Used  Substance Use Topics  . Alcohol use: Yes    Alcohol/week: 9.0 standard drinks    Types: 9 Glasses of wine per week    Comment: 1 drink nightly, 2 on weekends  . Drug use: No    MEDS: Prior to Admission medications   Medication Sig Start Date End Date Taking? Authorizing Provider  allopurinol (ZYLOPRIM) 100 MG tablet TAKE 2 TABLETS BY MOUTH EVERY DAY Patient taking differently: Take 200 mg by mouth daily.  11/20/17  Yes Henson, Vickie L, NP-C  anastrozole (ARIMIDEX) 1 MG tablet TAKE ONE TABLET BY MOUTH DAILY Patient taking differently: Take 1 mg by mouth daily.  05/29/17  Yes Magrinat, Virgie Dad, MD  atorvastatin (LIPITOR) 20 MG tablet Take 1  tablet (20 mg total) by mouth daily. 11/09/17  Yes Henson, Vickie L, NP-C  baclofen (LIORESAL) 10 MG tablet Take 1 tablet (10 mg total) by mouth 3 (three) times daily as needed. Patient taking differently: Take 10 mg by mouth at bedtime.  04/03/18  Yes Henson, Vickie L, NP-C  Biotin (BIOTIN 5000) 5 MG CAPS Take 5 mg by mouth daily.    Yes [provider]  busPIRone (BUSPAR) 15 MG tablet Take 15 mg by mouth 3 (three) times daily.  09/28/17  Yes [provider]  Calcium Carbonate-Vitamin D (CALCIUM 500/D PO) Take 2 each by mouth daily.    Yes [provider]  colchicine 0.6 MG tablet Take 2 tablets now and 1 tablet an hour later for flare  up Patient taking differently: Take 0.6 mg by mouth daily as needed (for gout flare up).  07/10/17  Yes Henson, Vickie L, NP-C  Eszopiclone (ESZOPICLONE) 3 MG TABS Take 3 mg by mouth at bedtime. Take immediately before bedtime   Yes [provider]  furosemide (LASIX) 20 MG tablet Take 1 tablet (20 mg total) by mouth daily as needed. Reported on 10/28/2015 Patient taking differently: Take 20 mg by mouth daily as needed for fluid.  12/04/17  Yes Henson, Vickie L, NP-C  hydrOXYzine (VISTARIL) 25 MG capsule Take 25 mg by mouth 2 (two) times daily as needed for anxiety.   Yes [provider]  ibuprofen (ADVIL,MOTRIN) 200 MG tablet Take 400-600 mg by mouth every 6 (six) hours as needed for headache or moderate pain.   Yes [provider]  levothyroxine (SYNTHROID, LEVOTHROID) 75 MCG tablet TAKE 1 TABLET BY MOUTH EVERY DAY Patient taking differently: Take 75 mcg by mouth daily before breakfast.  03/08/18  Yes Henson, Vickie L, NP-C  LINZESS 145 MCG CAPS capsule TAKE 1 [place] BREAKFAST Patient taking differently: Take 145 mcg by mouth daily before breakfast.  03/08/18  Yes Henson, Vickie L, NP-C  Magnesium 500 MG CAPS Take 500 mg by mouth daily.   Yes [provider]  Melatonin 3 MG CAPS  Take 9 mg by mouth at bedtime.   Yes [provider]  metoprolol succinate (TOPROL-XL) 25 MG 24 hr tablet Take 0.5 tablets (12.5 mg total) by mouth every evening. 03/08/18  Yes Hilty, Nadean Corwin, MD  olmesartan-hydrochlorothiazide (BENICAR HCT) 40-12.5 MG tablet TAKE 1 TABLET BY MOUTH EVERY DAY Patient taking differently: Take 1 tablet by mouth daily.  11/13/17  Yes Henson, Vickie L, NP-C  polyethylene glycol powder (MIRALAX) powder Take 17 g by mouth daily as needed for moderate constipation.    Yes [provider]  potassium chloride (KLOR-CON 10) 10 MEQ tablet Take 1 tablet (10 mEq total) by mouth as needed. Reported on 10/28/2015 Patient taking differently: Take 10 mEq by mouth daily as needed (with Lasix).  12/04/17  Yes Henson, Vickie L, NP-C  primidone (MYSOLINE) 50 MG tablet TAKE 1 TABLET(50 MG) BY MOUTH AT BEDTIME Patient taking differently: Take 50 mg by mouth at bedtime.  04/02/18  Yes Henson, Vickie L, NP-C  traMADol (ULTRAM) 50 MG tablet TAKE ONE TABLET BY MOUTH EVERY 6 HOURS AS NEEDED Patient taking differently: Take 50 mg by mouth every 6 (six) hours as needed for moderate pain.  05/18/17  Yes Henson, Vickie L, NP-C  vitamin B-12 (CYANOCOBALAMIN) 1000 MCG tablet Take 1,000 mcg by mouth daily.   Yes [provider]  levofloxacin (LEVAQUIN) 500 MG tablet Take 1 tablet (500 mg total) by mouth daily. 04/20/18   Henson, Vickie L, NP-C  promethazine-dextromethorphan (PROMETHAZINE-DM) 6.25-15 MG/5ML syrup Take 5 mLs by mouth at bedtime as needed for cough. 04/03/18   Henson, Vickie L, NP-C  rOPINIRole (REQUIP) 0.5 MG tablet Take 1.5 tablets (0.75 mg total) by mouth at bedtime. 04/20/18   Narda Amber K, DO    ALLERGY: Allergies  Allergen Reactions  . Ampicillin Hives and Other (See Comments)    Severe reaction in February 2017 1.5 month to have hives to go away  . Penicillins Other (See Comments)    Severe reaction in February 2017 1.5 month to have hives to go  away Has patient had a PCN reaction causing immediate rash, facial/tongue/throat swelling, SOB or lightheadedness with hypotension: #  #  #  YES  #  #  #  Has patient had a PCN reaction causing severe rash involving mucus membranes or skin necrosis: No Has patient had a PCN reaction that required hospitalization No Has patient had a PCN reaction occurring within the last 10 years: No   . Morphine And Related Itching  . Other Itching    UNSPECIFIED Analgesics  . Codeine Nausea And Vomiting  . Hydrocodone-Acetaminophen Itching  . Hydromorphone Itching    Pt has taken w/o issues    Social History   Tobacco Use  . Smoking status: Current Some Day Smoker    Packs/day: 0.50    Years: 35.00    Pack years: 17.50    Types: Cigarettes  . Smokeless tobacco: Never Used  Substance Use Topics  . Alcohol use: Yes    Alcohol/week: 9.0 standard drinks    Types: 9 Glasses of wine per week    Comment: 1 drink nightly, 2 on weekends     Family History  Problem Relation Age of Onset  . Atrial fibrillation Mother   . Breast cancer Maternal Aunt   . Lung cancer Maternal Uncle   . Stomach cancer Maternal Grandmother   . Lung cancer Maternal Aunt   . Brain cancer Maternal Aunt   . Prostate cancer Maternal Grandfather   . Stroke Paternal Grandmother   . Cancer Paternal Grandfather   . Colon cancer Neg Hx   . Esophageal cancer Neg Hx   . Rectal cancer Neg Hx      ROS   ROS  Exam   There were no vitals filed for this visit. General appearance: WDWN, NAD Eyes: No scleral injection Cardiovascular: Regular rate and rhythm without murmurs, rubs, gallops. No edema or variciosities. Distal pulses normal. Pulmonary: Effort normal, non-labored breathing Musculoskeletal:     Muscle tone upper extremities: Normal    Muscle tone lower extremities: Normal    Motor exam: Upper Extremities Deltoid Bicep Tricep Grip  Right 5/5 5/5 5/5 5/5  Left 5/5 5/5 5/5 5/5   Lower Extremity IP Quad PF DF  EHL  Right 5/5 5/5 5/5 5/5 5/5  Left 5/5 5/5 5/5 5/5 5/5   Neurological Mental Status:    - Patient is awake, alert, oriented to person, place, month, year, and situation    - Patient is able to give a clear and coherent history.    - No signs of aphasia or neglect Cranial Nerves    - II: Visual Fields are full. PERRL    - III/IV/VI: EOMI without ptosis or diploplia.     - V: Facial sensation is grossly normal    - VII: Facial movement is symmetric.     - VIII: hearing is intact to voice    - X: Uvula elevates symmetrically    - XI: Shoulder shrug is symmetric.    - XII: tongue is midline without atrophy or fasciculations.  Sensory: Sensation grossly intact to LT Deep Tendon Reflex    - 2+ and symmetric in the biceps and patellae.  Plantars   - Toes are downgoing bilaterally.  Cerebellar    - FNF and HKS are intact bilaterally   Results - Imaging/Labs   No results found for this or any previous visit (from the past 48 hour(s)).  No results found.  IMAGING: CT myelogram dated 02/20/2018 was reviewed.  This demonstrates good position of the previously implanted hardware at L4-L5 and S1.  There does appear to be good interbody fusion at L5-S1,  and by my interpretation does appear to also demonstrate good interbody fusion at L4-5.  There is large broad-based disc bulge at L3-4, with ligamentous hypertrophy, and bilateral facet arthropathy along with significant disc disease with vacuum disc phenomenon.  There is resultant severe central stenosis with near complete myelographic block at this level.  In comparison to prior MRI of the lumbar spine dated October 2017, there has been significant progression of stenosis.  Impression/Plan   73 y.o. female with back and leg pain related to severe progressive stenosis at L3-4 adjacent to previous decompression and fusion from L4-S1.   Unfortunately, she is currently recovering from CAP with continued symptoms of cough and recent Xray  demonstrating some atelectasis. I have spoken with Dr. Ola Spurr (anesthesia). Given her symptoms and history of smoking we feel it would be safer to allow her to recovery symptomatically from the pna and reschedule this elective surgery in a few weeks. I have communicated the above to the patient and her family who appear to understand and are agreeable. All questions were answered. My office will contact Mrs. Gift to reschedule.

## 2018-04-26 ENCOUNTER — Encounter (HOSPITAL_COMMUNITY): Payer: Self-pay | Admitting: Anesthesiology

## 2018-04-26 ENCOUNTER — Encounter (HOSPITAL_COMMUNITY)
Admission: RE | Disposition: A | Discharge status: Home / Self Care | Payer: Self-pay | Source: Home / Self Care | Attending: Neurosurgery

## 2018-04-26 ENCOUNTER — Encounter (HOSPITAL_COMMUNITY): Payer: Self-pay

## 2018-04-26 ENCOUNTER — Other Ambulatory Visit: Payer: Self-pay

## 2018-04-26 ENCOUNTER — Ambulatory Visit (HOSPITAL_COMMUNITY)
Admission: RE | Admit: 2018-04-26 | Discharge: 2018-04-26 | Disposition: A | Discharge status: Home / Self Care | Payer: PPO | Attending: Neurosurgery | Admitting: Neurosurgery

## 2018-04-26 DIAGNOSIS — Z808 Family history of malignant neoplasm of other organs or systems: Secondary | ICD-10-CM | POA: Insufficient documentation

## 2018-04-26 DIAGNOSIS — Z886 Allergy status to analgesic agent status: Secondary | ICD-10-CM | POA: Insufficient documentation

## 2018-04-26 DIAGNOSIS — Z885 Allergy status to narcotic agent status: Secondary | ICD-10-CM | POA: Insufficient documentation

## 2018-04-26 DIAGNOSIS — Z79899 Other long term (current) drug therapy: Secondary | ICD-10-CM | POA: Diagnosis not present

## 2018-04-26 DIAGNOSIS — Z8249 Family history of ischemic heart disease and other diseases of the circulatory system: Secondary | ICD-10-CM | POA: Insufficient documentation

## 2018-04-26 DIAGNOSIS — J189 Pneumonia, unspecified organism: Secondary | ICD-10-CM | POA: Diagnosis not present

## 2018-04-26 DIAGNOSIS — Z853 Personal history of malignant neoplasm of breast: Secondary | ICD-10-CM | POA: Diagnosis not present

## 2018-04-26 DIAGNOSIS — E785 Hyperlipidemia, unspecified: Secondary | ICD-10-CM | POA: Insufficient documentation

## 2018-04-26 DIAGNOSIS — K219 Gastro-esophageal reflux disease without esophagitis: Secondary | ICD-10-CM | POA: Insufficient documentation

## 2018-04-26 DIAGNOSIS — J440 Chronic obstructive pulmonary disease with acute lower respiratory infection: Secondary | ICD-10-CM | POA: Insufficient documentation

## 2018-04-26 DIAGNOSIS — R7303 Prediabetes: Secondary | ICD-10-CM | POA: Diagnosis not present

## 2018-04-26 DIAGNOSIS — M109 Gout, unspecified: Secondary | ICD-10-CM | POA: Insufficient documentation

## 2018-04-26 DIAGNOSIS — G629 Polyneuropathy, unspecified: Secondary | ICD-10-CM | POA: Diagnosis not present

## 2018-04-26 DIAGNOSIS — G2581 Restless legs syndrome: Secondary | ICD-10-CM | POA: Diagnosis not present

## 2018-04-26 DIAGNOSIS — Z79890 Hormone replacement therapy (postmenopausal): Secondary | ICD-10-CM | POA: Diagnosis not present

## 2018-04-26 DIAGNOSIS — M48061 Spinal stenosis, lumbar region without neurogenic claudication: Secondary | ICD-10-CM | POA: Insufficient documentation

## 2018-04-26 DIAGNOSIS — Z981 Arthrodesis status: Secondary | ICD-10-CM | POA: Insufficient documentation

## 2018-04-26 DIAGNOSIS — Z823 Family history of stroke: Secondary | ICD-10-CM | POA: Diagnosis not present

## 2018-04-26 DIAGNOSIS — I10 Essential (primary) hypertension: Secondary | ICD-10-CM | POA: Diagnosis not present

## 2018-04-26 DIAGNOSIS — F17210 Nicotine dependence, cigarettes, uncomplicated: Secondary | ICD-10-CM | POA: Insufficient documentation

## 2018-04-26 DIAGNOSIS — Z539 Procedure and treatment not carried out, unspecified reason: Secondary | ICD-10-CM | POA: Diagnosis not present

## 2018-04-26 DIAGNOSIS — Z85828 Personal history of other malignant neoplasm of skin: Secondary | ICD-10-CM | POA: Diagnosis not present

## 2018-04-26 DIAGNOSIS — E039 Hypothyroidism, unspecified: Secondary | ICD-10-CM | POA: Diagnosis not present

## 2018-04-26 DIAGNOSIS — M1990 Unspecified osteoarthritis, unspecified site: Secondary | ICD-10-CM | POA: Insufficient documentation

## 2018-04-26 SURGERY — POSTERIOR LUMBAR FUSION 1 LEVEL
Anesthesia: General

## 2018-04-26 MED ORDER — THROMBIN 5000 UNITS EX SOLR
CUTANEOUS | Status: AC
  Filled 2018-04-26: qty 5000

## 2018-04-26 MED ORDER — FENTANYL CITRATE (PF) 250 MCG/5ML IJ SOLN
INTRAMUSCULAR | Status: AC
  Filled 2018-04-26: qty 5

## 2018-04-26 MED ORDER — CHLORHEXIDINE GLUCONATE CLOTH 2 % EX PADS: 6.0000 | MEDICATED_PAD | Freq: Once | CUTANEOUS | Status: DC

## 2018-04-26 MED ORDER — MIDAZOLAM HCL 2 MG/2ML IJ SOLN
INTRAMUSCULAR | Status: AC
  Filled 2018-04-26: qty 2

## 2018-04-26 MED ORDER — LIDOCAINE-EPINEPHRINE 1 %-1:100000 IJ SOLN
INTRAMUSCULAR | Status: AC
  Filled 2018-04-26: qty 1

## 2018-04-26 MED ORDER — LACTATED RINGERS IV SOLN
INTRAVENOUS | Status: DC
  Administered 2018-04-26: 12:00:00 via INTRAVENOUS

## 2018-04-26 MED ORDER — PROPOFOL 10 MG/ML IV BOLUS
INTRAVENOUS | Status: AC
  Filled 2018-04-26: qty 20

## 2018-04-26 MED ORDER — BUPIVACAINE HCL (PF) 0.5 % IJ SOLN
INTRAMUSCULAR | Status: AC
  Filled 2018-04-26: qty 30

## 2018-04-26 NOTE — Progress Notes (Signed)
Patient's procedure canceled by anesthesia due to sign/symptoms of pneumonia and had just finished her last dose of abx yesterday.  Dr. Kathyrn Sheriff aware.  Patient's IV removed.  Patient escorted to main entrance via wheelchair with nurse tech.

## 2018-04-26 NOTE — Anesthesia Preprocedure Evaluation (Deleted)
Anesthesia Evaluation  Patient identified by MRN, date of birth, ID band Patient awake    Reviewed: Allergy & Precautions, NPO status , Patient's Chart, lab work & pertinent test results, reviewed documented beta blocker date and time   Airway Mallampati: II  TM Distance: >3 FB Neck ROM: Full    Dental  (+) Dental Advisory Given   Pulmonary COPD, Current Smoker,    breath sounds clear to auscultation       Cardiovascular hypertension, Pt. on medications and Pt. on home beta blockers  Rhythm:Regular Rate:Normal  01/2016 Low risk nuclear stress test. No ischemia. Echo Normal LVEF   Neuro/Psych Anxiety negative neurological ROS     GI/Hepatic Neg liver ROS, GERD  ,  Endo/Other  Hypothyroidism   Renal/GU Renal disease     Musculoskeletal  (+) Arthritis ,   Abdominal   Peds  Hematology negative hematology ROS (+)   Anesthesia Other Findings   Reproductive/Obstetrics                             Lab Results  Component Value Date   WBC 12.1 (H) 04/20/2018   HGB 14.3 04/20/2018   HCT 44.3 04/20/2018   MCV 102.8 (H) 04/20/2018   PLT 277 04/20/2018   Lab Results  Component Value Date   CREATININE 1.36 (H) 04/20/2018   BUN 24 (H) 04/20/2018   NA 141 04/20/2018   K 4.2 04/20/2018   CL 112 (H) 04/20/2018   CO2 24 04/20/2018    Anesthesia Physical Anesthesia Plan  ASA: III  Anesthesia Plan: General   Post-op Pain Management:    Induction: Intravenous  PONV Risk Score and Plan: 2 and Dexamethasone, Ondansetron and Treatment may vary due to age or medical condition  Airway Management Planned: Oral ETT  Additional Equipment:   Intra-op Plan:   Post-operative Plan: Extubation in OR  Informed Consent: I have reviewed the patients History and Physical, chart, labs and discussed the procedure including the risks, benefits and alternatives for the proposed anesthesia with the patient  or authorized representative who has indicated his/her understanding and acceptance.   Dental advisory given  Plan Discussed with: CRNA  Anesthesia Plan Comments: (Pt still with productive cough following 17days of antibiotic treatment for pneumonia with heavy smoking history. Case cancelled and pt will need to follow up with PCP and have symptom resolution x 2 weeks prior to surgery. Discussed with Dr. Kathyrn Sheriff who is in agreement with that plan for elective back surgery.  Deatra Canter, MD)       Anesthesia Quick Evaluation

## 2018-05-02 ENCOUNTER — Other Ambulatory Visit: Payer: Self-pay | Admitting: Family Medicine

## 2018-05-02 ENCOUNTER — Telehealth: Payer: Self-pay | Admitting: Internal Medicine

## 2018-05-02 MED ORDER — LEVOFLOXACIN 500 MG PO TABS: 500.0000 mg | ORAL_TABLET | Freq: Every day | ORAL | 0 refills | Status: DC

## 2018-05-02 NOTE — Telephone Encounter (Signed)
Called and left message for CMA about update on pt

## 2018-05-02 NOTE — Telephone Encounter (Signed)
Pt is negative for pneumonia but because she still having coughing and mucous we are treating for bronchitis as she still has an infection in her lungs that will not show up in xray (per Jcl) I will send in another 10 days of antibiotics and will contact Dr. Kathyrn Sheriff about her

## 2018-05-02 NOTE — Telephone Encounter (Signed)
Pt called to let us know that she could not have her surgery cause she had a coughing spell before her surgery and they would not proceed. Pt states she finished her antibiotic Thursday and is feeling 75% better but still coughing and has yellowish mucous coming up still. She is rescheduled for surgery on 05/10/18 but wants to make sure she is better by then and how does she know if pneumonia is done. The doctor told her she has to wait at least 2 weeks after antibiotics for surgery

## 2018-05-02 NOTE — Telephone Encounter (Signed)
Let's try another round of Levaquin and if she is not back to 100% after this we will need to see her and possibly get another XR. Please send in 10 days of this antibiotic as discussed.

## 2018-05-03 ENCOUNTER — Telehealth: Payer: Self-pay | Admitting: Internal Medicine

## 2018-05-03 ENCOUNTER — Other Ambulatory Visit: Payer: Self-pay | Admitting: Family Medicine

## 2018-05-03 MED ORDER — ALBUTEROL SULFATE HFA 108 (90 BASE) MCG/ACT IN AERS: INHALATION_SPRAY | RESPIRATORY_TRACT | 0 refills | Status: DC

## 2018-05-03 NOTE — Telephone Encounter (Signed)
Pt called at states she would like an inhaler for bronchitis to see if that would help her. She is having some SOB when coughing and walking up and down stairs. She is not having any chest pain, palpatations, leg swelling or leg pain.   She is still weak due to the back pain and went and got natural cough medicine. Her left foot and knees are swelling but that's due to RA and comes and goes.   Per vickie ok to send med for inhaler in 2 puffs every 4-6 hours and she is to follow-up her janaury 30th to make sure she is better. And repeat xray to see she is healed to have back surgery on 06/12/18

## 2018-05-05 ENCOUNTER — Other Ambulatory Visit: Payer: Self-pay | Admitting: Oncology

## 2018-05-05 ENCOUNTER — Other Ambulatory Visit: Payer: Self-pay | Admitting: Family Medicine

## 2018-05-14 ENCOUNTER — Other Ambulatory Visit: Payer: Self-pay | Admitting: Family Medicine

## 2018-05-15 NOTE — Telephone Encounter (Signed)
Is this okay to refill? 

## 2018-05-15 NOTE — Telephone Encounter (Signed)
I cannot recall for which problem she takes the Tramadol. Is this due to a chronic issue that her orthopedist or back surgeon is treating her for? Since this is a controlled substance, I need proper documentation as to why she is taking it, as well as how often. If she is being seen for this problem by someone else, then I would advise that they refill as appropriate. Please find out. Thanks.

## 2018-05-16 NOTE — Telephone Encounter (Signed)
Pt has 1 pill left but has an appt this Thursday with vickie and will discuss this med then with her. She is using this for back pain.Brandi Stephens and she is having to use it more often than until back surgery

## 2018-05-17 ENCOUNTER — Encounter: Payer: Self-pay | Admitting: Family Medicine

## 2018-05-17 ENCOUNTER — Ambulatory Visit (INDEPENDENT_AMBULATORY_CARE_PROVIDER_SITE_OTHER): Payer: PPO | Admitting: Family Medicine

## 2018-05-17 VITALS — BP 110/70 | HR 89 | Temp 97.7°F | Resp 16 | Wt 243.0 lb

## 2018-05-17 DIAGNOSIS — R0602 Shortness of breath: Secondary | ICD-10-CM | POA: Diagnosis not present

## 2018-05-17 DIAGNOSIS — R609 Edema, unspecified: Secondary | ICD-10-CM | POA: Diagnosis not present

## 2018-05-17 DIAGNOSIS — R05 Cough: Secondary | ICD-10-CM

## 2018-05-17 DIAGNOSIS — E039 Hypothyroidism, unspecified: Secondary | ICD-10-CM

## 2018-05-17 DIAGNOSIS — N189 Chronic kidney disease, unspecified: Secondary | ICD-10-CM | POA: Diagnosis not present

## 2018-05-17 DIAGNOSIS — R7303 Prediabetes: Secondary | ICD-10-CM

## 2018-05-17 DIAGNOSIS — R942 Abnormal results of pulmonary function studies: Secondary | ICD-10-CM | POA: Diagnosis not present

## 2018-05-17 DIAGNOSIS — Z8709 Personal history of other diseases of the respiratory system: Secondary | ICD-10-CM | POA: Diagnosis not present

## 2018-05-17 DIAGNOSIS — F17200 Nicotine dependence, unspecified, uncomplicated: Secondary | ICD-10-CM

## 2018-05-17 DIAGNOSIS — R053 Chronic cough: Secondary | ICD-10-CM

## 2018-05-17 MED ORDER — TRAMADOL HCL 50 MG PO TABS: 50.0000 mg | ORAL_TABLET | Freq: Four times a day (QID) | ORAL | 0 refills | Status: DC | PRN

## 2018-05-17 NOTE — Telephone Encounter (Signed)
Ok to send in a partial refill of Tramadol 50 mg. #15 tablets and then she is aware that she will need to get this refilled in the future by her back surgeon or orthopedist.

## 2018-05-17 NOTE — Progress Notes (Signed)
Subjective:    Patient ID: Brandi Stephens, female    DOB: [birth year], 73 y.o.   MRN: [id number]  HPI Chief Complaint  Patient presents with  . follow-up    follow-up- coughing in morning 4-5 times and then lying down will make her cough. still hoarse.     She is here to follow up on chronic cough.  She also had fever, chills, fatigue along with her cough initially.  Chest x-ray showed mild atelectasis/infiltrate. She has completed 2 rounds of Levaquin.  Reports feeling 80 to 85% improved but she still has a chronic morning cough with green mucus.  She had cut way back on smoking but apparently she is now smoking daily again and actually has increased to 10 cigarettes per day.   Reports having history of COPD but denies ever seeing pulmonology.  States she was diagnosed while she was in the hospital.  Currently she is using albuterol as needed.  Denies fever, chills, dizziness, chest pain, palpitations,  abdominal pain, N/V/D, urinary symptoms, LE edema.    She is scheduled for back surgery the end of February.  This has been postponed a couple of times due to cough and congestion. Requests refill of Tramadol. States she will ask her back surgeon to treat her pain going forward as I have requested but is now out of her Tramadol.   She has concerns regarding some fluid retention.  States she occasionally takes her fluid pill along with a potassium pill.    Taking levothyroxine daily.   Reviewed allergies, medications, past medical, surgical, family, and social history.  Review of Systems Pertinent positives and negatives in the history of present illness.      Objective:   Physical Exam Constitutional:      General: She is not in acute distress.    Appearance: Normal appearance. She is not ill-appearing.  Neck:     Musculoskeletal: Normal range of motion and neck supple.     Thyroid: No thyromegaly.  Cardiovascular:     Rate and Rhythm: Normal rate and regular rhythm.    Pulses: Normal pulses.     Heart sounds: Normal heart sounds.  Pulmonary:     Effort: Pulmonary effort is normal.     Breath sounds: Normal breath sounds.  Musculoskeletal:     Right lower leg: No edema.     Left lower leg: No edema.  Lymphadenopathy:     Cervical: No cervical adenopathy.  Neurological:     Mental Status: She is alert.    BP 110/70   Pulse 89   Temp 97.7 F (36.5 C) (Oral)   Resp 16   Wt 243 lb (110.2 kg)   SpO2 93%   BMI 38.06 kg/m       Assessment & Plan:  Chronic cough - Plan: Spirometry with graph, Ambulatory referral to Pulmonology, CBC with Differential/Platelet, Comprehensive metabolic panel  SOB (shortness of breath) - Plan: Spirometry with graph, Ambulatory referral to Pulmonology  History of COPD - Plan: Ambulatory referral to Pulmonology  Smoker - Plan: Ambulatory referral to Pulmonology  Chronic kidney disease, unspecified CKD stage - Plan: Comprehensive metabolic panel  Prediabetes - Plan: TSH, T4, free, Hemoglobin A1c  Abnormal PFT  Fluid retention - Plan: CBC with Differential/Platelet, Comprehensive metabolic panel  Hypothyroidism, unspecified type - Plan: TSH, T4, free  She reports feeling at least 80 to 85% improved since starting on her antibiotics.  She did take 2 rounds of Levaquin.  Using albuterol.  Questionable history of COPD and has never seen a pulmonologist.  Mildly abnormal PFTs today. Upcoming back surgery and needs clearance for this. Referral to pulmonology for further evaluation. Unfortunately she has started back smoking daily.  Counseled on stopping smoking. Check labs to evaluate status of prediabetes and CKD. Hypothyroidism-continue on current dose of levothyroxine and check TSH. I will give her a partial refill of her tramadol until she is able to contact her back surgeon.  She is aware that they will need to treat her chronic pain. Follow-up pending labs

## 2018-05-17 NOTE — Addendum Note (Signed)
Addended by: Minette Headland A on: 05/17/2018 12:02 PM   Modules accepted: Orders

## 2018-05-17 NOTE — Telephone Encounter (Signed)
Please approve medication. Pt is aware to contact back doctor for tramadol from now on.

## 2018-05-18 LAB — CBC WITH DIFFERENTIAL/PLATELET
BASOS ABS: 0.1 10*3/uL (ref 0.0–0.2)
Basos: 1 %
EOS (ABSOLUTE): 0.5 10*3/uL — ABNORMAL HIGH (ref 0.0–0.4)
Eos: 4 %
Hematocrit: 41.8 % (ref 34.0–46.6)
Hemoglobin: 14.3 g/dL (ref 11.1–15.9)
Immature Grans (Abs): 0.2 10*3/uL — ABNORMAL HIGH (ref 0.0–0.1)
Immature Granulocytes: 2 %
Lymphocytes Absolute: 1.6 10*3/uL (ref 0.7–3.1)
Lymphs: 13 %
MCH: 32.9 pg (ref 26.6–33.0)
MCHC: 34.2 g/dL (ref 31.5–35.7)
MCV: 96 fL (ref 79–97)
Monocytes Absolute: 0.8 10*3/uL (ref 0.1–0.9)
Monocytes: 6 %
Neutrophils Absolute: 9.3 10*3/uL — ABNORMAL HIGH (ref 1.4–7.0)
Neutrophils: 74 %
Platelets: 273 10*3/uL (ref 150–450)
RBC: 4.35 x10E6/uL (ref 3.77–5.28)
RDW: 13.1 % (ref 11.7–15.4)
WBC: 12.6 10*3/uL — ABNORMAL HIGH (ref 3.4–10.8)

## 2018-05-18 LAB — COMPREHENSIVE METABOLIC PANEL
ALT: 10 IU/L (ref 0–32)
AST: 8 IU/L (ref 0–40)
Albumin/Globulin Ratio: 1.9 (ref 1.2–2.2)
Albumin: 4 g/dL (ref 3.7–4.7)
Alkaline Phosphatase: 79 IU/L (ref 39–117)
BUN/Creatinine Ratio: 22 (ref 12–28)
BUN: 22 mg/dL (ref 8–27)
Bilirubin Total: <0.2 mg/dL (ref 0.0–1.2)
CALCIUM: 9.5 mg/dL (ref 8.7–10.3)
CO2: 22 mmol/L (ref 20–29)
Chloride: 104 mmol/L (ref 96–106)
Creatinine, Ser: 1 mg/dL (ref 0.57–1.00)
GFR calc Af Amer: 65 mL/min/{1.73_m2} (ref >59)
GFR calc non Af Amer: 57 mL/min/{1.73_m2} — ABNORMAL LOW (ref >59)
Globulin, Total: 2.1 g/dL (ref 1.5–4.5)
Glucose: 121 mg/dL — ABNORMAL HIGH (ref 65–99)
Potassium: 4.4 mmol/L (ref 3.5–5.2)
Sodium: 142 mmol/L (ref 134–144)
Total Protein: 6.1 g/dL (ref 6.0–8.5)

## 2018-05-18 LAB — TSH: TSH: 1.29 u[IU]/mL (ref 0.450–4.500)

## 2018-05-18 LAB — HEMOGLOBIN A1C
Est. average glucose Bld gHb Est-mCnc: 117 mg/dL
Hgb A1c MFr Bld: 5.7 % — ABNORMAL HIGH (ref 4.8–5.6)

## 2018-05-18 LAB — T4, FREE: FREE T4: 1.23 ng/dL (ref 0.82–1.77)

## 2018-05-22 ENCOUNTER — Encounter: Payer: Self-pay | Admitting: Pulmonary Disease

## 2018-05-22 ENCOUNTER — Ambulatory Visit (INDEPENDENT_AMBULATORY_CARE_PROVIDER_SITE_OTHER): Payer: PPO | Admitting: Pulmonary Disease

## 2018-05-22 DIAGNOSIS — R0602 Shortness of breath: Secondary | ICD-10-CM | POA: Diagnosis not present

## 2018-05-22 MED ORDER — FLUTICASONE-UMECLIDIN-VILANT 100-62.5-25 MCG/INH IN AEPB: 1.0000 | INHALATION_SPRAY | Freq: Every day | RESPIRATORY_TRACT | 0 refills | Status: DC

## 2018-05-22 MED ORDER — PREDNISONE 10 MG PO TABS: 10.0000 mg | ORAL_TABLET | Freq: Two times a day (BID) | ORAL | 0 refills | Status: DC

## 2018-05-22 NOTE — Patient Instructions (Signed)
Recurrent bronchitis Chronic back pain  CT scan of the chest Prednisone 10 p.o. twice daily for 5 to 7 days [name]-1 inhalation daily  I will see you back in the office on the 18th  Continue your efforts at quitting smoking The possibility of you being able to have surgery safely as scheduled is directly related to you staying off cigarettes-you have done it before, you can do it again.

## 2018-05-22 NOTE — Progress Notes (Signed)
Brandi Stephens    [id number]    March 25, 1946  Primary Care Physician:Henson, Laurian Brim, NP-C  Referring Physician: Girtha Rm, NP-C [place], [place] [address]  Chief complaint:   Patient with recurrent bronchitis History of COPD  HPI:  Has had multiple exacerbations recently Been evaluated for back surgery Has a history of chronic obstructive pulmonary disease Uses albuterol as needed Has had multiple exacerbations of bronchitis/pneumonia since December She is still coughing bringing up some secretions-clear to yellow No longer having fevers Recently completed antibiotics  An active smoker, vape use occasionally  She has very significant/severe back pain-surgery has been canceled on a couple uppercase  She was able to quit smoking for about 8 years in the past  No pertinent occupational history  She was diagnosed with COPD about 2017-at the time she did have sepsis  History of breast cancer for which she received radiation treatments in the past  Outpatient Encounter Medications as of 05/22/2018  Medication Sig  . albuterol (PROVENTIL HFA;VENTOLIN HFA) 108 (90 Base) MCG/ACT inhaler INHALE 2 PUFFS BY MOUTH EVERY 4 TO 6 HOURS AS NEEDED  . allopurinol (ZYLOPRIM) 100 MG tablet TAKE 2 TABLETS(200 MG) BY MOUTH DAILY  . anastrozole (ARIMIDEX) 1 MG tablet Take 1 tablet (1 mg total) by mouth daily.  Marland Kitchen atorvastatin (LIPITOR) 20 MG tablet TAKE 1 TABLET BY MOUTH EVERY DAY  . baclofen (LIORESAL) 10 MG tablet Take 1 tablet (10 mg total) by mouth 3 (three) times daily as needed. (Patient taking differently: Take 10 mg by mouth at bedtime. )  . Biotin (BIOTIN 5000) 5 MG CAPS Take 5 mg by mouth daily.   . busPIRone (BUSPAR) 15 MG tablet Take 15 mg by mouth 3 (three) times daily.   . Calcium Carbonate-Vitamin D (CALCIUM 500/D PO) Take 2 each by mouth daily.   . colchicine 0.6 MG tablet Take 2 tablets now and 1 tablet an hour later for flare up (Patient  taking differently: Take 0.6 mg by mouth daily as needed (for gout flare up). )  . Eszopiclone (ESZOPICLONE) 3 MG TABS Take 3 mg by mouth at bedtime. Take immediately before bedtime  . furosemide (LASIX) 20 MG tablet Take 1 tablet (20 mg total) by mouth daily as needed. Reported on 10/28/2015 (Patient taking differently: Take 20 mg by mouth daily as needed for fluid. )  . hydrOXYzine (VISTARIL) 25 MG capsule Take 25 mg by mouth 2 (two) times daily as needed for anxiety.  Marland Kitchen ibuprofen (ADVIL,MOTRIN) 200 MG tablet Take 400-600 mg by mouth every 6 (six) hours as needed for headache or moderate pain.  Marland Kitchen levothyroxine (SYNTHROID, LEVOTHROID) 75 MCG tablet TAKE 1 TABLET BY MOUTH EVERY DAY (Patient taking differently: Take 75 mcg by mouth daily before breakfast. )  . LINZESS 145 MCG CAPS capsule TAKE 1 CAPSULE BY MOUTH EVERY DAY BEFORE BREAKFAST (Patient taking differently: Take 145 mcg by mouth daily before breakfast. )  . Magnesium 500 MG CAPS Take 500 mg by mouth daily.  . Melatonin 3 MG CAPS Take 9 mg by mouth at bedtime.  . metoprolol succinate (TOPROL-XL) 25 MG 24 hr tablet Take 0.5 tablets (12.5 mg total) by mouth every evening.  . olmesartan-hydrochlorothiazide (BENICAR HCT) 40-12.5 MG tablet Take 1 tablet by mouth daily.  . polyethylene glycol powder (MIRALAX) powder Take 17 g by mouth daily as needed for moderate constipation.   . potassium chloride (KLOR-CON 10) 10 MEQ tablet Take 1  tablet (10 mEq total) by mouth as needed. Reported on 10/28/2015 (Patient taking differently: Take 10 mEq by mouth daily as needed (with Lasix). )  . primidone (MYSOLINE) 50 MG tablet TAKE 1 TABLET(50 MG) BY MOUTH AT BEDTIME (Patient taking differently: Take 50 mg by mouth at bedtime. )  . rOPINIRole (REQUIP) 0.5 MG tablet Take 1.5 tablets (0.75 mg total) by mouth at bedtime.  . traMADol (ULTRAM) 50 MG tablet Take 1 tablet (50 mg total) by mouth every 6 (six) hours as needed for moderate pain.  . vitamin B-12  (CYANOCOBALAMIN) 1000 MCG tablet Take 1,000 mcg by mouth daily.  . predniSONE (DELTASONE) 10 MG tablet Take 1 tablet (10 mg total) by mouth 2 (two) times daily with a meal.   No facility-administered encounter medications on file as of 05/22/2018.     Allergies as of 05/22/2018 - Review Complete 05/22/2018  Allergen Reaction Noted  . Ampicillin Hives and Other (See Comments) 09/30/2015  . Penicillins Other (See Comments) 03/11/2011  . Morphine and related Itching 03/11/2011  . Other Itching 02/26/2018  . Codeine Nausea And Vomiting 03/11/2011  . Hydrocodone-acetaminophen Itching 02/26/2018  . Hydromorphone Itching 03/13/2015    Past Medical History:  Diagnosis Date  . AIN (anal intraepithelial neoplasia) anal canal   . Anemia    with pregnancy  . Anxiety   . Arthritis    knees, lower back, knees  . Chronic constipation   . Coarse tremors    Essential  . COPD (chronic obstructive pulmonary disease) ([place])    2017 chest CT, history of  . Degenerative lumbar spinal stenosis   . GERD (gastroesophageal reflux disease)    pt denies  . Gout   . Grade I diastolic dysfunction 26/83/4196   Noted ECHO  . Hemorrhoids   . History of adenomatous polyp of colon   . History of basal cell carcinoma (BCC) excision 2015   nose  . History of sepsis 05/14/2014   post lumbar surgery  . History of shingles    x2  . Hyperlipidemia   . Hypertension   . Hypothyroidism   . Irregular heart beat    history of while undergoing radiation  . Malignant neoplasm of upper-inner quadrant of left breast in female, estrogen receptor positive [facility]) oncologist-  dr Jana Hakim--  per lov in epic no recurrence   dx 07-13-2015  Left breast invasive ductal carcinoma, Stage IA, Grade 1 (TXN0),  09-16-2015  s/p  left breast lumpectomy with sln bx's,  completed radiation 11-18-2015,  started antiestrogen therapy 12-14-2015  . Neuropathy    Left foot and back of left leg  . Personal history of radiation therapy     completed 11-18-2015  left breast  . PONV (postoperative nausea and vomiting)   . Prediabetes    diet controlled, no med  . Restless leg syndrome   . Seasonal allergies   . Seizure ([place]) 05/2015   due to sepsis, only one time episode  . Wears partial dentures    lower     Past Surgical History:  Procedure Laterality Date  . ABDOMINAL HYSTERECTOMY  1985  . BASAL CELL CARCINOMA EXCISION  10/15   nose  . BREAST BIOPSY Right 08/24/2015  . BREAST BIOPSY Right 07/13/2015  . BREAST BIOPSY Left 07/13/2015  . BREAST EXCISIONAL BIOPSY Left 1972  . BREAST LUMPECTOMY WITH RADIOACTIVE SEED AND SENTINEL LYMPH NODE BIOPSY Left 09/16/2015   Procedure: BREAST LUMPECTOMY WITH RADIOACTIVE SEED AND SENTINEL LYMPH NODE BIOPSY;  Surgeon: Dorris Fetch  Barry Dienes, MD;  Location: [facility];  Service: General;  Laterality: Left;  . BREAST REDUCTION SURGERY Bilateral 1998  . CATARACT EXTRACTION W/ INTRAOCULAR LENS IMPLANT Left 2016  . [place]   hallo  . COLONOSCOPY  01/2013   polyps  . EYE SURGERY Left 2016  . HAND SURGERY Left 02-19-2002   dr Fredna Dow  @[facility]    repair collateral ligament/ MPJ left thumb  . [place]  2008  . HIGH RESOLUTION ANOSCOPY N/A 02/28/2018   Procedure: HIGH RESOLUTION ANOSCOPY WITH BIOPSY;  Surgeon: Leighton Ruff, MD;  Location: [facility];  Service: General;  Laterality: N/A;  . KNEE ARTHROSCOPY Right 2002  . LUMBAR SPINE SURGERY  03-20-2015   dr Kathyrn Sheriff   fusion L4-5, L5-S1  . MULTIPLE TOOTH EXTRACTIONS     with bone grafting lower bottom right and left  . RIGHT COLECTOMY  09-10-2003   dr Margot Chimes @[facility]    multiple colon polyps (per path tubular adenoma's, hyperplastic , benign appendix, benign two lymph nodes  . ROTATOR CUFF REPAIR Left 04/2016  . TONSILLECTOMY  1969  . TUBAL LIGATION Bilateral 1982    Family History  Problem Relation Age of Onset  . Atrial fibrillation Mother   . Breast cancer Maternal Aunt   . Lung  cancer Maternal Uncle   . Stomach cancer Maternal Grandmother   . Lung cancer Maternal Aunt   . Brain cancer Maternal Aunt   . Prostate cancer Maternal Grandfather   . Stroke Paternal Grandmother   . Cancer Paternal Grandfather   . Colon cancer Neg Hx   . Esophageal cancer Neg Hx   . Rectal cancer Neg Hx     Social History   Socioeconomic History  . Marital status: Married    Spouse name: robert  . Number of children: 3  . Years of education: college  . Highest education level: Not on file  Occupational History  . Occupation: retired  Scientific laboratory technician  . Financial resource strain: Not on file  . Food insecurity:    Worry: Not on file    Inability: Not on file  . Transportation needs:    Medical: Not on file    Non-medical: Not on file  Tobacco Use  . Smoking status: Current Every Day Smoker    Packs/day: 0.50    Years: 35.00    Pack years: 17.50    Types: Cigarettes  . Smokeless tobacco: Never Used  Substance and Sexual Activity  . Alcohol use: Yes    Alcohol/week: 9.0 standard drinks    Types: 9 Glasses of wine per week    Comment: 1 drink nightly, 2 on weekends  . Drug use: No  . Sexual activity: Yes    Birth control/protection: Post-menopausal  Lifestyle  . Physical activity:    Days per week: Not on file    Minutes per session: Not on file  . Stress: Not on file  Relationships  . Social connections:    Talks on phone: Not on file    Gets together: Not on file    Attends religious service: Not on file    Active member of club or organization: Not on file    Attends meetings of clubs or organizations: Not on file    Relationship status: Not on file  . Intimate partner violence:    Fear of current or ex partner: Not on file    Emotionally abused: Not on file    Physically abused:  Not on file    Forced sexual activity: Not on file  Other Topics Concern  . Not on file  Social History Narrative   Lives with husband in a 2 story home.  Has 2 living children.   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  Education: college.     Review of Systems  Constitutional: Negative.   HENT: Negative.   Respiratory: Positive for cough, shortness of breath and wheezing.   Cardiovascular: Negative.   Gastrointestinal: Negative.   Endocrine: Negative.    Vitals:   05/22/18 1017  BP: 126/84  Pulse: 98  SpO2: 94%     Physical Exam  Constitutional: She appears well-developed and well-nourished.  HENT:  Head: Normocephalic and atraumatic.  Eyes: Pupils are equal, round, and reactive to light. Conjunctivae are normal. Right eye exhibits no discharge.  Neck: Normal range of motion. Neck supple. No tracheal deviation present. No thyromegaly present.  Cardiovascular: Normal rate and regular rhythm.  Pulmonary/Chest: Effort normal and breath sounds normal. No respiratory distress. She has no wheezes. She has no rales. She exhibits no tenderness.  Decreased breath sounds at the bases bilaterally  Abdominal: Soft. She exhibits no distension. There is no abdominal tenderness.    Data Reviewed: CT scan of the chest reviewed showing atelectasis Chest x-ray reviewed  Spirometry shows no obstruction  Assessment:  COPD with exacerbation  Active smoker  Chronic back pain  History of breast cancer post radiation treatments in the past  Plan/Recommendations:  A good amount of time was spent counseling patient about the need to quit smoking and quit vape use  We will give her a course of prednisone 10 p.o. twice daily for 5 to 7 days  Obtain pulmonary function study if she is more stable as of next visit in about 2 weeks  We will repeat a CT scan of the chest to assess for resolution of previous infiltrative process  Likely culprit for recurrent exacerbation is underlying obstructive lung disease and active tobacco use  Importance of quitting smoking prior to the surgery was reiterated multiple times  We will give [place] a call if she has any  significant concerns   She can be cleared for surgery if she is better optimized on the next visit  A sample of [name] was provided to patient  Sherrilyn Rist MD [facility] 05/22/2018, 10:48 AM  CC: Girtha Rm, NP-C

## 2018-06-01 ENCOUNTER — Ambulatory Visit (INDEPENDENT_AMBULATORY_CARE_PROVIDER_SITE_OTHER)
Admission: RE | Admit: 2018-06-01 | Discharge: 2018-06-01 | Disposition: A | Discharge status: Home / Self Care | Payer: PPO | Source: Ambulatory Visit | Attending: Pulmonary Disease | Admitting: Pulmonary Disease

## 2018-06-01 ENCOUNTER — Other Ambulatory Visit: Payer: Self-pay | Admitting: Pulmonary Disease

## 2018-06-01 DIAGNOSIS — R0602 Shortness of breath: Secondary | ICD-10-CM

## 2018-06-01 DIAGNOSIS — I77810 Thoracic aortic ectasia: Secondary | ICD-10-CM | POA: Diagnosis not present

## 2018-06-04 ENCOUNTER — Encounter: Payer: Self-pay | Admitting: Adult Health

## 2018-06-04 ENCOUNTER — Ambulatory Visit (INDEPENDENT_AMBULATORY_CARE_PROVIDER_SITE_OTHER): Payer: PPO | Admitting: Adult Health

## 2018-06-04 ENCOUNTER — Ambulatory Visit (INDEPENDENT_AMBULATORY_CARE_PROVIDER_SITE_OTHER): Payer: PPO | Admitting: Pulmonary Disease

## 2018-06-04 DIAGNOSIS — J439 Emphysema, unspecified: Secondary | ICD-10-CM

## 2018-06-04 DIAGNOSIS — F17200 Nicotine dependence, unspecified, uncomplicated: Secondary | ICD-10-CM

## 2018-06-04 DIAGNOSIS — R0602 Shortness of breath: Secondary | ICD-10-CM | POA: Diagnosis not present

## 2018-06-04 DIAGNOSIS — R9389 Abnormal findings on diagnostic imaging of other specified body structures: Secondary | ICD-10-CM

## 2018-06-04 DIAGNOSIS — Z01818 Encounter for other preprocedural examination: Secondary | ICD-10-CM | POA: Insufficient documentation

## 2018-06-04 LAB — PULMONARY FUNCTION TEST
DL/VA % pred: 81 %
DL/VA: 3.28 ml/min/mmHg/L
DLCO cor % pred: 72 %
DLCO cor: 15.54 ml/min/mmHg
DLCO unc % pred: 74 %
DLCO unc: 15.95 ml/min/mmHg
FEF 25-75 Post: 1.49 L/sec
FEF 25-75 Pre: 1.23 L/sec
FEF2575-%Change-Post: 20 %
FEF2575-%PRED-POST: 73 %
FEF2575-%Pred-Pre: 60 %
FEV1-%Change-Post: 3 %
FEV1-%Pred-Post: 85 %
FEV1-%Pred-Pre: 82 %
FEV1-Post: 2.16 L
FEV1-Pre: 2.08 L
FEV1FVC-%Change-Post: 4 %
FEV1FVC-%Pred-Pre: 92 %
FEV6-%Change-Post: 0 %
FEV6-%Pred-Post: 92 %
FEV6-%Pred-Pre: 92 %
FEV6-Post: 2.95 L
FEV6-Pre: 2.94 L
FEV6FVC-%Change-Post: 0 %
FEV6FVC-%Pred-Post: 104 %
FEV6FVC-%Pred-Pre: 103 %
FVC-%Change-Post: 0 %
FVC-%PRED-PRE: 88 %
FVC-%Pred-Post: 88 %
FVC-PRE: 2.97 L
FVC-Post: 2.96 L
Post FEV1/FVC ratio: 73 %
Post FEV6/FVC ratio: 100 %
Pre FEV1/FVC ratio: 70 %
Pre FEV6/FVC Ratio: 99 %
RV % pred: 127 %
RV: 3.01 L
TLC % pred: 110 %
TLC: 6.06 L

## 2018-06-04 MED ORDER — FLUTICASONE FUROATE-VILANTEROL 100-25 MCG/INH IN AEPB: 1.0000 | INHALATION_SPRAY | Freq: Every day | RESPIRATORY_TRACT | 3 refills | Status: DC

## 2018-06-04 NOTE — Progress Notes (Signed)
Patient was made aware in office with tp today nothing further need at this time.

## 2018-06-04 NOTE — Progress Notes (Signed)
@Patient  ID: Brandi Stephens, female    DOB: 09/05/1945, 73 y.o.   MRN: [id number]  Chief Complaint  Patient presents with  . Follow-up    Dyspnea    Referring provider: Girtha Rm, NP-C  HPI: 73 year old female former smoker, quit 05/22/2018, seen for pulmonary consult May 17, 2018 for slow to resolve pneumonia and cough  TEST/EVENTS :   06/04/2018 Follow up : PNA, COPD , preop clearance  Patient returns for a 2-week follow-up.  Patient was seen last visit after a slow to resolve bronchitic and pneumonia.  Patient had received a prolonged course of antibiotics with persistent coughing. She was an active smoker.  And occasional vaping use.  She is retired.  She owned a noted clothing business. Patient had been previously told few years ago that she might have COPD but did not have any further evaluation.  Patient says that up until her recent pneumonia she was doing okay with her breathing.  She was on no inhalers. Last visit patient was started on [name].  Encouraged on smoking cessation.  And treated for cough suppression and given a course of prednisone.  Since last visit patient says she is feeling better.  Her cough has decreased substantially.  She has been able to quit smoking since her last visit.  Was congratulated on her smoking cessation.  She was also started on [name]. CT chest was done on June 01, 2018 that showed no acute process.  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  Mild emphysema Patient underwent pulmonary function testing today that showed normal lung function with FEV1 at 85%, ratio 73, 88% FVC, no significant bronchodilator response.,  Mid flow reversibility.  DLCO was 74%.  To note patient did take [name] prior to testing this morning Patient has chronic back pain.  Is had multiple back surgeries.  She is planning to undergo surgery next month.  She says she is mainly limited from her back pain and spasms.  Has become  deconditioned and sedentary due to back pain. Patient says she is independent at home.  He denies any shortness of breath at rest.  She is on no oxygen.  O2 saturation today is 95% on room air.  Allergies  Allergen Reactions  . Ampicillin Hives and Other (See Comments)    Severe reaction in February 2017 1.5 month to have hives to go away  . Penicillins Other (See Comments)    Severe reaction in February 2017 1.5 month to have hives to go away Has patient had a PCN reaction causing immediate rash, facial/tongue/throat swelling, SOB or lightheadedness with hypotension: #  #  #  YES  #  #  #  Has patient had a PCN reaction causing severe rash involving mucus membranes or skin necrosis: No Has patient had a PCN reaction that required hospitalization No Has patient had a PCN reaction occurring within the last 10 years: No   . Morphine And Related Itching    Patient states she is not allergic to morphine  . Other Itching    UNSPECIFIED Analgesics  . Codeine Nausea And Vomiting  . Hydrocodone-Acetaminophen Itching  . Hydromorphone Itching    Pt has taken w/o issues    Immunization History  Administered Date(s) Administered  . Influenza, High Dose Seasonal PF 05/01/2017  . Influenza-Unspecified 12/17/2013, 12/12/2017  . Pneumococcal Conjugate-13 01/14/2014  . Pneumococcal Polysaccharide-23 07/06/2016  . Tdap 07/02/2013  . Zoster Recombinat (Shingrix) 12/12/2017  Past Medical History:  Diagnosis Date  . AIN (anal intraepithelial neoplasia) anal canal   . Anemia    with pregnancy  . Anxiety   . Arthritis    knees, lower back, knees  . Chronic constipation   . Coarse tremors    Essential  . COPD (chronic obstructive pulmonary disease) ([place])    2017 chest CT, history of  . Degenerative lumbar spinal stenosis   . GERD (gastroesophageal reflux disease)    pt denies  . Gout   . Grade I diastolic dysfunction 83/38/2505   Noted ECHO  . Hemorrhoids   . History of adenomatous  polyp of colon   . History of basal cell carcinoma (BCC) excision 2015   nose  . History of sepsis 05/14/2014   post lumbar surgery  . History of shingles    x2  . Hyperlipidemia   . Hypertension   . Hypothyroidism   . Irregular heart beat    history of while undergoing radiation  . Malignant neoplasm of upper-inner quadrant of left breast in female, estrogen receptor positive [facility]) oncologist-  dr Jana Hakim--  per lov in epic no recurrence   dx 07-13-2015  Left breast invasive ductal carcinoma, Stage IA, Grade 1 (TXN0),  09-16-2015  s/p  left breast lumpectomy with sln bx's,  completed radiation 11-18-2015,  started antiestrogen therapy 12-14-2015  . Neuropathy    Left foot and back of left leg  . Personal history of radiation therapy    completed 11-18-2015  left breast  . PONV (postoperative nausea and vomiting)   . Prediabetes    diet controlled, no med  . Restless leg syndrome   . Seasonal allergies   . Seizure ([place]) 05/2015   due to sepsis, only one time episode  . Wears partial dentures    lower     Tobacco History: Social History   Tobacco Use  Smoking Status Former Smoker  . Packs/day: 0.50  . Years: 35.00  . Pack years: 17.50  . Types: Cigarettes  . Last attempt to quit: 05/22/2018  . Years since quitting: 0.0  Smokeless Tobacco Never Used   Counseling given: Not Answered   Outpatient Medications Prior to Visit  Medication Sig Dispense Refill  . albuterol (PROVENTIL HFA;VENTOLIN HFA) 108 (90 Base) MCG/ACT inhaler INHALE 2 PUFFS BY MOUTH EVERY 4 TO 6 HOURS AS NEEDED 42.5 g 0  . allopurinol (ZYLOPRIM) 100 MG tablet TAKE 2 TABLETS(200 MG) BY MOUTH DAILY 180 tablet 0  . anastrozole (ARIMIDEX) 1 MG tablet Take 1 tablet (1 mg total) by mouth daily. 90 tablet 0  . atorvastatin (LIPITOR) 20 MG tablet TAKE 1 TABLET BY MOUTH EVERY DAY 90 tablet 1  . baclofen (LIORESAL) 10 MG tablet Take 1 tablet (10 mg total) by mouth 3 (three) times daily as needed. (Patient taking  differently: Take 10 mg by mouth at bedtime. ) 90 tablet 0  . Biotin (BIOTIN 5000) 5 MG CAPS Take 5 mg by mouth daily.     . busPIRone (BUSPAR) 15 MG tablet Take 15 mg by mouth 3 (three) times daily.     . Calcium Carbonate-Vitamin D (CALCIUM 500/D PO) Take 2 each by mouth daily.     . colchicine 0.6 MG tablet Take 2 tablets now and 1 tablet an hour later for flare up (Patient taking differently: Take 0.6 mg by mouth daily as needed (for gout flare up). ) 6 tablet 0  . Eszopiclone (ESZOPICLONE) 3 MG TABS Take 3 mg by  mouth at bedtime. Take immediately before bedtime    . Fluticasone-Umeclidin-Vilant ([name] ELLIPTA) 100-62.5-25 MCG/INH AEPB Inhale 1 puff into the lungs daily. 2 each 0  . furosemide (LASIX) 20 MG tablet Take 1 tablet (20 mg total) by mouth daily as needed. Reported on 10/28/2015 (Patient taking differently: Take 20 mg by mouth daily as needed for fluid. ) 90 tablet 1  . hydrOXYzine (VISTARIL) 25 MG capsule Take 25 mg by mouth 2 (two) times daily as needed for anxiety.    Marland Kitchen ibuprofen (ADVIL,MOTRIN) 200 MG tablet Take 400-600 mg by mouth every 6 (six) hours as needed for headache or moderate pain.    Marland Kitchen levothyroxine (SYNTHROID, LEVOTHROID) 75 MCG tablet TAKE 1 TABLET BY MOUTH EVERY DAY (Patient taking differently: Take 75 mcg by mouth daily before breakfast. ) 90 tablet 1  . LINZESS 145 MCG CAPS capsule TAKE 1 CAPSULE BY MOUTH EVERY DAY BEFORE BREAKFAST (Patient taking differently: Take 145 mcg by mouth daily before breakfast. ) 90 capsule 1  . Magnesium 500 MG CAPS Take 500 mg by mouth daily.    . Melatonin 3 MG CAPS Take 9 mg by mouth at bedtime.    . metoprolol succinate (TOPROL-XL) 25 MG 24 hr tablet Take 0.5 tablets (12.5 mg total) by mouth every evening. 45 tablet 0  . olmesartan-hydrochlorothiazide (BENICAR HCT) 40-12.5 MG tablet Take 1 tablet by mouth daily. 90 tablet 1  . polyethylene glycol powder (MIRALAX) powder Take 17 g by mouth daily as needed for moderate constipation.      . potassium chloride (KLOR-CON 10) 10 MEQ tablet Take 1 tablet (10 mEq total) by mouth as needed. Reported on 10/28/2015 (Patient taking differently: Take 10 mEq by mouth daily as needed (with Lasix). ) 90 tablet 1  . predniSONE (DELTASONE) 10 MG tablet Take 1 tablet (10 mg total) by mouth 2 (two) times daily with a meal. 14 tablet 0  . primidone (MYSOLINE) 50 MG tablet TAKE 1 TABLET(50 MG) BY MOUTH AT BEDTIME (Patient taking differently: Take 50 mg by mouth at bedtime. ) 90 tablet 0  . rOPINIRole (REQUIP) 0.5 MG tablet Take 1.5 tablets (0.75 mg total) by mouth at bedtime. 140 tablet 3  . traMADol (ULTRAM) 50 MG tablet Take 1 tablet (50 mg total) by mouth every 6 (six) hours as needed for moderate pain. 15 tablet 0  . vitamin B-12 (CYANOCOBALAMIN) 1000 MCG tablet Take 1,000 mcg by mouth daily.     No facility-administered medications prior to visit.      Review of Systems:   Constitutional:   No  weight loss, night sweats,  Fevers, chills,  +fatigue, or  lassitude.  HEENT:   No headaches,  Difficulty swallowing,  Tooth/dental problems, or  Sore throat,                No sneezing, itching, ear ache, nasal congestion, post nasal drip,   CV:  No chest pain,  Orthopnea, PND, swelling in lower extremities, anasarca, dizziness, palpitations, syncope.   GI  No heartburn, indigestion, abdominal pain, nausea, vomiting, diarrhea, change in bowel habits, loss of appetite, bloody stools.   Resp:   No excess mucus, no productive cough,  No non-productive cough,  No coughing up of blood.  No change in color of mucus.  No wheezing.  No chest wall deformity  Skin: no rash or lesions.  GU: no dysuria, change in color of urine, no urgency or frequency.  No flank pain, no hematuria   MS:  No joint pain or swelling.  No decreased range of motion.  No back pain.    Physical Exam  BP 122/68 (BP Location: Left Arm, Cuff Size: Large)   Pulse 65   Ht 5\' 7"  (1.702 m)   Wt 244 lb 6.4 oz (110.9 kg)    SpO2 95%   BMI 38.28 kg/m   GEN: A/Ox3; pleasant , NAD, obese    HEENT:  [place]/AT,  EACs-clear, TMs-wnl, NOSE-clear, THROAT-clear, no lesions, no postnasal drip or exudate noted.   NECK:  Supple w/ fair ROM; no JVD; normal carotid impulses w/o bruits; no thyromegaly or nodules palpated; no lymphadenopathy.    RESP  Clear  P & A; w/o, wheezes/ rales/ or rhonchi. no accessory muscle use, no dullness to percussion  CARD:  RRR, no m/r/g, no peripheral edema, pulses intact, no cyanosis or clubbing.  GI:   Soft & nt; nml bowel sounds; no organomegaly or masses detected.   Musco: Warm bil, no deformities or joint swelling noted.   Neuro: alert, no focal deficits noted.    Skin: Warm, no lesions or rashes    Lab Results:  CBC    Component Value Date/Time   WBC 12.6 (H) 05/17/2018 1150   WBC 12.1 (H) 04/20/2018 1337   RBC 4.35 05/17/2018 1150   RBC 4.31 04/20/2018 1337   HGB 14.3 05/17/2018 1150   HGB 15.4 06/28/2016 1458   HCT 41.8 05/17/2018 1150   HCT 46.8 (H) 06/28/2016 1458   PLT 273 05/17/2018 1150   MCV 96 05/17/2018 1150   MCV 97.6 06/28/2016 1458   MCH 32.9 05/17/2018 1150   MCH 33.2 04/20/2018 1337   MCHC 34.2 05/17/2018 1150   MCHC 32.3 04/20/2018 1337   RDW 13.1 05/17/2018 1150   RDW 14.6 (H) 06/28/2016 1458   LYMPHSABS 1.6 05/17/2018 1150   LYMPHSABS 1.7 06/28/2016 1458   MONOABS 0.6 11/07/2017 1338   MONOABS 0.6 06/28/2016 1458   EOSABS 0.5 (H) 05/17/2018 1150   BASOSABS 0.1 05/17/2018 1150   BASOSABS 0.1 06/28/2016 1458    BMET    Component Value Date/Time   NA 142 05/17/2018 1150   NA 142 06/28/2016 1458   K 4.4 05/17/2018 1150   K 5.2 (H) 06/28/2016 1458   CL 104 05/17/2018 1150   CO2 22 05/17/2018 1150   CO2 24 06/28/2016 1458   GLUCOSE 121 (H) 05/17/2018 1150   GLUCOSE 123 (H) 04/20/2018 1337   GLUCOSE 114 06/28/2016 1458   BUN 22 05/17/2018 1150   BUN 23.6 06/28/2016 1458   CREATININE 1.00 05/17/2018 1150   CREATININE 0.97 07/08/2016 1042    CREATININE 1.0 06/28/2016 1458   CALCIUM 9.5 05/17/2018 1150   CALCIUM 10.3 06/28/2016 1458   GFRNONAA 57 (L) 05/17/2018 1150   GFRAA 65 05/17/2018 1150    BNP    Component Value Date/Time   BNP 145.5 (H) 05/15/2015 1457    ProBNP No results found for: PROBNP  Imaging: Ct Chest Wo Contrast  Result Date: 06/01/2018 CLINICAL DATA:  Shortness of breath with history of pneumonia in December. Treated with 27 days of antibiotics. History of breast cancer. EXAM: CT CHEST WITHOUT CONTRAST TECHNIQUE: Multidetector CT imaging of the chest was performed following the standard protocol without IV contrast. COMPARISON:  05/15/2015 FINDINGS: Cardiovascular: Heart is normal in size minimal calcified plaque over the thoracic aorta. Ascending thoracic aorta demonstrates moderate ectasia measuring 3.8 cm. Remaining vascular structures are unremarkable. Mediastinum/Nodes: No significant mediastinal or hilar adenopathy. Remaining mediastinal  structures are unremarkable. Lungs/Pleura: Lungs are well inflated with mild centrilobular emphysema. No significant pulmonary nodules/masses. No evidence of airspace consolidation or effusion. Airways are normal. Upper Abdomen: Unremarkable. Musculoskeletal: Minimal degenerative change of the spine. Surgical clips over the left breast. IMPRESSION: No acute cardiopulmonary disease. 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 Recommend annual imaging followup by CTA or MRA. This recommendation follows 2010 ACCF/AHA/AATS/ACR/ASA/SCA/SCAI/SIR/STS/SVM Guidelines for the Diagnosis and Management of Patients with Thoracic Aortic Disease. Circulation.2010; 121: L935-T017. Aortic aneurysm NOS (ICD10-I71.9). Aortic Atherosclerosis (ICD10-I70.0). Electronically Signed   By: Marin Olp M.D.   On: 06/01/2018 22:25      PFT Results Latest Ref Rng & Units 06/04/2018  FVC-Pre L 2.97  FVC-Predicted Pre % 88  FVC-Post L 2.96  FVC-Predicted Post % 88  Pre FEV1/FVC  % % 70  Post FEV1/FCV % % 73  FEV1-Pre L 2.08  FEV1-Predicted Pre % 82  FEV1-Post L 2.16  DLCO UNC% % 74  DLCO COR %Predicted % 81  TLC L 6.06  TLC % Predicted % 110  RV % Predicted % 127    No results found for: NITRICOXIDE      Assessment & Plan:   Mild emphysema (HCC) Emphysema noted on CT chest.  Pulmonary function testing today shows no evidence of airflow obstruction or restriction.  Patient does have some mid flow reversibility.  Seems that the majority of her shortness of breath is from deconditioning from her severe back pain and immobility.  For now we will change [name] to [facility] as she has done better since starting [name].  She is congratulated on smoking cessation.  She is to continue on mucolytic's if needed. Mobility as tolerated.  On return if still doing well can consider de-escalation of Breo  Plan  Patient Instructions  GREAT JOB ON NOT SMOKING . Keep up good work.  Change [name] to BREO 1 puff daily . Rinse well after use.  Best recovery wishes for your upcoming back surgery .  Follow up with Dr. Gala Murdoch or Annya Lizana NP in 6-8 weeks and As needed       Smoker Smoking cessation -patient is doing very well..  Preoperative clearance Pulmonary preop clearance.  Patient's pulmonary function testing is normal.  She has mild emphysema on CT chest.  She is not on any oxygen.  O2 saturations are normal on room air today.  Patient is independent.  She is cleared from a pulmonary standpoint to proceed with upcoming elective back surgery.  Discussed potential pulmonary complications.  Encouraged her on early mobility and pulmonary hygiene.  She has been congratulated on smoking cessation and encouraged to continue with this.  Major Pulmonary risks identified in the multifactorial risk analysis are but not limited to a) pneumonia; b) recurrent intubation risk; c) prolonged or recurrent acute respiratory failure needing mechanical ventilation; d) prolonged  hospitalization; e) DVT/Pulmonary embolism; f) Acute Pulmonary edema  Recommend 1. Short duration of surgery as much as possible and avoid paralytic if possible 2. Recovery in step down or ICU with Pulmonary consultation if indicated.  3. DVT prophylaxis 4. Aggressive pulmonary toilet with o2, bronchodilatation, and incentive spirometry and early ambulation 5. Would take BREO prior to surgery  6. Use Albuterol Neb every 4hrs as needed.      Abnormal CT of the chest Incidental finding on CT chest  CT chest was done on June 01, 2018 that showed no acute process.  There was mild ectasia of the ascending thoracic aorta measuring  3.8 cm.  Mild emphysema Will refer to PCP to decide if further testing is indicated.      Rexene Edison, NP 06/04/2018

## 2018-06-04 NOTE — Assessment & Plan Note (Signed)
Smoking cessation -patient is doing very well.Brandi Stephens

## 2018-06-04 NOTE — Progress Notes (Signed)
Patient completed full PFT today. 

## 2018-06-04 NOTE — Progress Notes (Signed)
Patient was seen in ofiice today by TP nothing further needed at this time

## 2018-06-04 NOTE — Patient Instructions (Signed)
GREAT JOB ON NOT SMOKING . Keep up good work.  Change [name] to BREO 1 puff daily . Rinse well after use.  Best recovery wishes for your upcoming back surgery .  Follow up with Dr. Gala Murdoch or Shericka Johnstone NP in 6-8 weeks and As needed

## 2018-06-04 NOTE — Assessment & Plan Note (Signed)
Incidental finding on CT chest  CT chest was done on June 01, 2018 that showed no acute process.  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  Mild emphysema Will refer to PCP to decide if further testing is indicated.

## 2018-06-04 NOTE — Assessment & Plan Note (Signed)
Pulmonary preop clearance.  Patient's pulmonary function testing is normal.  She has mild emphysema on CT chest.  She is not on any oxygen.  O2 saturations are normal on room air today.  Patient is independent.  She is cleared from a pulmonary standpoint to proceed with upcoming elective back surgery.  Discussed potential pulmonary complications.  Encouraged her on early mobility and pulmonary hygiene.  She has been congratulated on smoking cessation and encouraged to continue with this.  Major Pulmonary risks identified in the multifactorial risk analysis are but not limited to a) pneumonia; b) recurrent intubation risk; c) prolonged or recurrent acute respiratory failure needing mechanical ventilation; d) prolonged hospitalization; e) DVT/Pulmonary embolism; f) Acute Pulmonary edema  Recommend 1. Short duration of surgery as much as possible and avoid paralytic if possible 2. Recovery in step down or ICU with Pulmonary consultation if indicated.  3. DVT prophylaxis 4. Aggressive pulmonary toilet with o2, bronchodilatation, and incentive spirometry and early ambulation 5. Would take BREO prior to surgery  6. Use Albuterol Neb every 4hrs as needed.

## 2018-06-04 NOTE — Assessment & Plan Note (Signed)
Emphysema noted on CT chest.  Pulmonary function testing today shows no evidence of airflow obstruction or restriction.  Patient does have some mid flow reversibility.  Seems that the majority of her shortness of breath is from deconditioning from her severe back pain and immobility.  For now we will change [name] to [facility] as she has done better since starting [name].  She is congratulated on smoking cessation.  She is to continue on mucolytic's if needed. Mobility as tolerated.  On return if still doing well can consider de-escalation of Breo  Plan  Patient Instructions  GREAT JOB ON NOT SMOKING . Keep up good work.  Change [name] to BREO 1 puff daily . Rinse well after use.  Best recovery wishes for your upcoming back surgery .  Follow up with Dr. Gala Murdoch or Tristin Vandeusen NP in 6-8 weeks and As needed

## 2018-06-06 ENCOUNTER — Other Ambulatory Visit: Payer: Self-pay | Admitting: Neurosurgery

## 2018-06-08 ENCOUNTER — Inpatient Hospital Stay (HOSPITAL_COMMUNITY)
Admission: RE | Admit: 2018-06-08 | Discharge: 2018-06-08 | Disposition: A | Discharge status: Home / Self Care | Payer: PPO | Source: Ambulatory Visit

## 2018-06-21 NOTE — Pre-Procedure Instructions (Signed)
Brandi Stephens  06/21/2018      [facility] [place], [name] [address] Phone: [phone] Fax: [phone]    Your procedure is scheduled on March 13th.  Report to [facility] Entrance "A" Admitting at 11:00 A.M.  Call this number if you have problems the morning of surgery:  [phone]   Remember:  Do not eat or drink after midnight.    Take these medicines the morning of surgery with A SIP OF WATER   Albuterol Inhaler - if needed (bring with you)  Arimidex  Buspirone (Buspar)  Breo Ellipta Inhaler (bring with you)  [name] Ellipta (bring with you)  Hydroxyzine (Vistaril)  Levothyroxine (Synthroid)  Linzess  Metoprolol   7 days prior to surgery STOP taking any Aspirin (unless otherwise instructed by your surgeon), Aleve, Naproxen, Ibuprofen, Motrin, Advil, Goody's, BC's, all herbal medications, fish oil, and all vitamins.     Do not wear jewelry, make-up or nail polish.  Do not wear lotions, powders, or perfumes, or deodorant.  Do not shave 48 hours prior to surgery.    Do not bring valuables to the hospital.  [facility] is not responsible for any belongings or valuables.   [facility]- Preparing For Surgery  Before surgery, you can play an important role. Because skin is not sterile, your skin needs to be as free of germs as possible. You can reduce the number of germs on your skin by washing with CHG (chlorahexidine gluconate) Soap before surgery.  CHG is an antiseptic cleaner which kills germs and bonds with the skin to continue killing germs even after washing.    Oral Hygiene is also important to reduce your risk of infection.  Remember - BRUSH YOUR TEETH THE MORNING OF SURGERY WITH YOUR REGULAR TOOTHPASTE  Please do not use if you have an allergy to CHG or antibacterial soaps. If your skin becomes reddened/irritated stop using the CHG.  Do not  shave (including legs and underarms) for at least 48 hours prior to first CHG shower. It is OK to shave your face.  Please follow these instructions carefully.   1. Shower the NIGHT BEFORE SURGERY and the MORNING OF SURGERY with CHG.   2. If you chose to wash your hair, wash your hair first as usual with your normal shampoo.  3. After you shampoo, rinse your hair and body thoroughly to remove the shampoo.  4. Use CHG as you would any other liquid soap. You can apply CHG directly to the skin and wash gently with a scrungie or a clean washcloth.   5. Apply the CHG Soap to your body ONLY FROM THE NECK DOWN.  Do not use on open wounds or open sores. Avoid contact with your eyes, ears, mouth and genitals (private parts). Wash Face and genitals (private parts)  with your normal soap.  6. Wash thoroughly, paying special attention to the area where your surgery will be performed.  7. Thoroughly rinse your body with warm water from the neck down.  8. DO NOT shower/wash with your normal soap after using and rinsing off the CHG Soap.  9. Pat yourself dry with a CLEAN TOWEL.  10. Wear CLEAN PAJAMAS to bed the night before surgery, wear comfortable clothes the morning of surgery  11. Place CLEAN SHEETS on your bed the night of your first shower and DO NOT SLEEP WITH  PETS.   Day of Surgery:  Do not apply any deodorants/lotions.  Please wear clean clothes to the hospital/surgery center.   Remember to brush your teeth WITH YOUR REGULAR TOOTHPASTE.   Contacts, dentures or bridgework may not be worn into surgery.  Leave your suitcase in the car.  After surgery it may be brought to your room.  For patients admitted to the hospital, discharge time will be determined by your treatment team.  Patients discharged the day of surgery will not be allowed to drive home.    Please read over the following fact sheets that you were given. Coughing and Deep Breathing and Surgical Site Infection  Prevention

## 2018-06-22 ENCOUNTER — Other Ambulatory Visit: Payer: Self-pay

## 2018-06-22 ENCOUNTER — Encounter (HOSPITAL_COMMUNITY)
Admission: RE | Admit: 2018-06-22 | Discharge: 2018-06-22 | Disposition: A | Discharge status: Home / Self Care | Payer: PPO | Source: Ambulatory Visit | Attending: Neurosurgery | Admitting: Neurosurgery

## 2018-06-22 ENCOUNTER — Encounter (HOSPITAL_COMMUNITY): Payer: Self-pay

## 2018-06-22 DIAGNOSIS — Z01812 Encounter for preprocedural laboratory examination: Secondary | ICD-10-CM | POA: Insufficient documentation

## 2018-06-22 HISTORY — DX: Pneumonia, unspecified organism: J18.9

## 2018-06-22 LAB — SURGICAL PCR SCREEN
MRSA, PCR: NEGATIVE
Staphylococcus aureus: NEGATIVE

## 2018-06-22 LAB — CBC
HCT: 43.7 % (ref 36.0–46.0)
Hemoglobin: 14 g/dL (ref 12.0–15.0)
MCH: 31.7 pg (ref 26.0–34.0)
MCHC: 32 g/dL (ref 30.0–36.0)
MCV: 99.1 fL (ref 80.0–100.0)
Platelets: 244 10*3/uL (ref 150–400)
RBC: 4.41 MIL/uL (ref 3.87–5.11)
RDW: 12.9 % (ref 11.5–15.5)
WBC: 9.9 10*3/uL (ref 4.0–10.5)
nRBC: 0 % (ref 0.0–0.2)

## 2018-06-22 LAB — BASIC METABOLIC PANEL
Anion gap: 7 (ref 5–15)
BUN: 26 mg/dL — ABNORMAL HIGH (ref 8–23)
CHLORIDE: 114 mmol/L — AB (ref 98–111)
CO2: 20 mmol/L — ABNORMAL LOW (ref 22–32)
Calcium: 9.7 mg/dL (ref 8.9–10.3)
Creatinine, Ser: 1.22 mg/dL — ABNORMAL HIGH (ref 0.44–1.00)
GFR calc Af Amer: 52 mL/min — ABNORMAL LOW (ref >60)
GFR calc non Af Amer: 45 mL/min — ABNORMAL LOW (ref >60)
GLUCOSE: 115 mg/dL — AB (ref 70–99)
Potassium: 5.2 mmol/L — ABNORMAL HIGH (ref 3.5–5.1)
Sodium: 141 mmol/L (ref 135–145)

## 2018-06-22 LAB — TYPE AND SCREEN
ABO/RH(D): A POS
Antibody Screen: NEGATIVE

## 2018-06-22 NOTE — Progress Notes (Signed)
PCP - Harland Dingwall, PA Cardiologist - patient denies Pulmonologist - Dr. Ander Slade; PFT's 06/04/2018; clearance in [place] on 06/04/2018  Chest x-ray - 04/20/2018; CT chest 06/01/2018 EKG - 02/28/2018 Stress Test - patient denies ECHO - 2017 Cardiac Cath - patient denies  Sleep Study - patient denies CPAP - n/a  Fasting Blood Sugar - n/a Checks Blood Sugar _____ times a day  Blood Thinner Instructions: patient denies Aspirin Instructions: patient denies  Anesthesia review: yes, patient had pneumonia in January; surgery canceled due to; she is experiencing some nasal drainage, a cough at times that is worse in the mornings after just waking up.  Patient denies shortness of breath, fever, cough and chest pain at PAT appointment   Patient verbalized understanding of instructions that were given to them at the PAT appointment. Patient was also instructed that they will need to review over the PAT instructions again at home before surgery.

## 2018-06-22 NOTE — Progress Notes (Signed)
Anesthesia PAT Evaluation:  Case:  [id number] Date/Time:  06/29/18 1245   Procedure:  PLIF - L3-4; biomechanical device, interbody arthrodesis, extension of instrumentation from L3-S1 (N/A )   Anesthesia type:  General   Pre-op diagnosis:  Adjacent segment disease with spinal stenosis   Location:  [facility] OR ROOM 66 / [place] OR   Surgeon:  [name], MD      DISCUSSION: Patient is a 73 year old female scheduled for the above procedure. She initially came in for surgery on 04/26/18, but was cancelled after it was learned that she had just completed treatment for right basilar pneumonia. Since then she has had two visits with pulmonology, last with Rexene Edison, NP on 06/04/18 and "cleared from a pulmonary standpoint to proceed with upcoming elective back surgery." See note in Epic for details. Patient has also quit smoking!  History includes former smoker (quit 05/22/18), post-operative N/V, HTN, hypothyroidism, pre-diabetes, COPD, idiopathic peripheral neuropathy, essential tremor, breast cancer (s/p left lumpectomy 09/16/15, radiation), palpitations (while undergoing radiation 2017), cecal polyps (s/p right colectomy 09/10/03),L4-S1 fusion 20/2/54 (complicated by readmission 05/15/15 for AMS with "shaking off the bed" followed by "jerking movements of her limbs, diagnosed with HCAP, sepsis; I don't see mention of formal seizure diagnosis), anoscopy 02/28/18 (pathology: mild dysplasia, AINI). She reports 9 glasses of wine/week. BMI is consistent with obesity.  I evaluated patient during her PAT visit due to sinus congestion that started after she had two upper teeth extracted 06/15/18. She was told by the oral surgeon that she may have sinus congestion afterwards. She does feel like she has had some sinus pressures and drainage since with a intermittent mildly congested cough. No sore throat or fever. She feels like symptoms are worse upon first awakening, but are improving. No SOB or chest pain. Her surgery was  previously cancelled due to pneumonia, so she is concerned about it getting canceled again. She has since recovered from pneumonia after completing multiple rounds of antibiotics (27 days total) and 10 day course of steroids. She has seen pulmonology and was cleared for surgery. She continues to use [employer] as recommended.   Exam shows a pleasant Caucasian female in NAD. Lungs are clear without wheezes, crackles, or rhonchi. No posterior pharyngeal erythema. Heart RRR, no murmur noted. Patient does not appear ill on exam. Minimal coughing (x1 brief episode) noted. Lungs clear. Discussed that although exam reassuring today, her surgery is still a week out. Should her sinus drainage stop improving or she develop worsening symptoms such as fever, SOB, persistent coughing, wheezing then she should seek further evaluation by her PCP or pulmonologist--and notify surgeon as would likely need to delay surgery. Hopefully, she will continue to improve. Anesthesia team to re-evaluate on the day of surgery.   VS: BP (!) 142/84 Comment: taken manually  Pulse 75   Temp 36.6 C   Ht 5\' 7"  (1.702 m)   Wt 109.8 kg   SpO2 95%   BMI 37.90 kg/m    PROVIDERS: Harland Dingwall L, NP-C is PCP Narda Amber, DO is neurologist. Last visit 04/20/18 for follow-up RLS, essential tremor, idiopathic peripheral neuropathy.  Sherrilyn Rist, MD is pulmonologist Magrinat, Sarajane Jews, MD is HEM-ONC. Kyung Rudd, MD is RAD-ONC. Lyman Bishop, MD is cardiologist. He saw her in 2017-2018 for palpitations, tachycardia, chest pain. She had a stress test, echo, and Holter monitor. Toprol recommended for palpitations/tachycardia. PRN follow-up recommended 07/05/16.    LABS: Labs reviewed: Acceptable for surgery. A1c 5.7 on 05/17/18. (all labs  ordered are listed, but only abnormal results are displayed)  Labs Reviewed  BASIC METABOLIC PANEL - Abnormal; Notable for the following components:      Result Value   Potassium 5.2 (*)     Chloride 114 (*)    CO2 20 (*)    Glucose, Bld 115 (*)    BUN 26 (*)    Creatinine, Ser 1.22 (*)    GFR calc non Af Amer 45 (*)    GFR calc Af Amer 52 (*)    All other components within normal limits  SURGICAL PCR SCREEN  CBC  TYPE AND SCREEN    PFTs 06/04/18: FVC 2.97 (88%), post 2.96 (88%) FEV1 2.08 (82%), post 2.16 (85%) Post FEV1/FVC ration 73% DLCO unc 15.95 (74%) No significant BD response. Mild flow reversibility.    IMAGES: CT Chest 06/01/18: IMPRESSION: - No acute cardiopulmonary disease. - Mild ectasia of the ascending thoracic aorta measuring 3.8 cm. Recommend annual imaging followup by CTA or MRA. This recommendation follows 2010 ACCF/AHA/AATS/ACR/ASA/SCA/SCAI/SIR/STS/SVM Guidelines for the Diagnosis and Management of Patients with Thoracic Aortic Disease. Circulation.2010; 121: G295-M841. Aortic aneurysm NOS (ICD10-I71.9). - Aortic Atherosclerosis (ICD10-I70.0). (Pulmonology referred to PCP to decide if/when further testing indicated.)   EKG: 02/28/18: Sinus tachycardia at 111 bpm Nonspecific ST and T wave abnormality Abnormal ECG Since last tracing rate slower Confirmed by Dorris Carnes [phone]) on 03/01/2018 9:51:44 PM   CV: Echo 01/22/16: Study Conclusions - Left ventricle: The cavity size was normal. Wall thickness was   increased in a pattern of mild LVH. Systolic function was   vigorous. The estimated ejection fraction was in the range of 65%   to 70%. Doppler parameters are consistent with abnormal left   ventricular relaxation (grade 1 diastolic dysfunction).  Nuclear stress test 01/22/16:  The left ventricular ejection fraction is normal (55-65%).  Nuclear stress EF: 64%.  There was no ST segment deviation noted during stress.  The study is normal.  This is a low risk study. Normal resting and stress perfusion. No ischemia or infarction EF 64%  48 Hour Holter Monitor 01/18/16: Study Highlights: Sinus rhythm and sinus tachy. Infrequent  PVC's.   Past Medical History:  Diagnosis Date  . AIN (anal intraepithelial neoplasia) anal canal   . Anemia    with pregnancy  . Anxiety   . Arthritis    knees, lower back, knees  . Chronic constipation   . Coarse tremors    Essential  . COPD (chronic obstructive pulmonary disease) ([place])    2017 chest CT, history of  . Degenerative lumbar spinal stenosis   . GERD (gastroesophageal reflux disease)    pt denies  . Gout   . Grade I diastolic dysfunction 02/12/2535   Noted ECHO  . Hemorrhoids   . History of adenomatous polyp of colon   . History of basal cell carcinoma (BCC) excision 2015   nose  . History of sepsis 05/14/2014   post lumbar surgery  . History of shingles    x2  . Hyperlipidemia   . Hypertension   . Hypothyroidism   . Irregular heart beat    history of while undergoing radiation  . Malignant neoplasm of upper-inner quadrant of left breast in female, estrogen receptor positive [facility]) oncologist-  dr Jana Hakim--  per lov in epic no recurrence   dx 07-13-2015  Left breast invasive ductal carcinoma, Stage IA, Grade 1 (TXN0),  09-16-2015  s/p  left breast lumpectomy with sln bx's,  completed radiation 11-18-2015,  started  antiestrogen therapy 12-14-2015  . Neuropathy    Left foot and back of left leg  . Personal history of radiation therapy    completed 11-18-2015  left breast  . Pneumonia   . PONV (postoperative nausea and vomiting)   . Prediabetes    diet controlled, no med  . Restless leg syndrome   . Seasonal allergies   . Seizure ([place]) 05/2015   due to sepsis, only one time episode  . Wears partial dentures    lower     Past Surgical History:  Procedure Laterality Date  . ABDOMINAL HYSTERECTOMY  1985  . BASAL CELL CARCINOMA EXCISION  10/15   nose  . BREAST BIOPSY Right 08/24/2015  . BREAST BIOPSY Right 07/13/2015  . BREAST BIOPSY Left 07/13/2015  . BREAST EXCISIONAL BIOPSY Left 1972  . BREAST LUMPECTOMY WITH RADIOACTIVE SEED AND SENTINEL LYMPH  NODE BIOPSY Left 09/16/2015   Procedure: BREAST LUMPECTOMY WITH RADIOACTIVE SEED AND SENTINEL LYMPH NODE BIOPSY;  Surgeon: Stark Klein, MD;  Location: [facility];  Service: General;  Laterality: Left;  . BREAST REDUCTION SURGERY Bilateral 1998  . CATARACT EXTRACTION W/ INTRAOCULAR LENS IMPLANT Left 2016  . [place]   hallo  . COLONOSCOPY  01/2013   polyps  . EYE SURGERY Left 2016  . HAND SURGERY Left 02-19-2002   dr Fredna Dow  @[facility]    repair collateral ligament/ MPJ left thumb  . [place]  2008  . HIGH RESOLUTION ANOSCOPY N/A 02/28/2018   Procedure: HIGH RESOLUTION ANOSCOPY WITH BIOPSY;  Surgeon: Leighton Ruff, MD;  Location: [facility];  Service: General;  Laterality: N/A;  . KNEE ARTHROSCOPY Right 2002  . LUMBAR SPINE SURGERY  03-20-2015   dr Kathyrn Sheriff   fusion L4-5, L5-S1  . MOUTH SURGERY  07/14/2018   2 infected teeth removed  . MULTIPLE TOOTH EXTRACTIONS     with bone grafting lower bottom right and left  . RIGHT COLECTOMY  09-10-2003   dr Margot Chimes @[facility]    multiple colon polyps (per path tubular adenoma's, hyperplastic , benign appendix, benign two lymph nodes  . ROTATOR CUFF REPAIR Left 04/2016  . TONSILLECTOMY  1969  . TUBAL LIGATION Bilateral 1982    MEDICATIONS: . albuterol (PROVENTIL HFA;VENTOLIN HFA) 108 (90 Base) MCG/ACT inhaler  . allopurinol (ZYLOPRIM) 100 MG tablet  . anastrozole (ARIMIDEX) 1 MG tablet  . atorvastatin (LIPITOR) 20 MG tablet  . baclofen (LIORESAL) 10 MG tablet  . Biotin (BIOTIN 5000) 5 MG CAPS  . busPIRone (BUSPAR) 15 MG tablet  . Calcium Carbonate-Vitamin D (CALCIUM 500/D PO)  . colchicine 0.6 MG tablet  . Eszopiclone (ESZOPICLONE) 3 MG TABS  . fluticasone furoate-vilanterol (BREO ELLIPTA) 100-25 MCG/INH AEPB  . Fluticasone-Umeclidin-Vilant ([name] ELLIPTA) 100-62.5-25 MCG/INH AEPB  . furosemide (LASIX) 20 MG tablet  . hydrOXYzine (VISTARIL) 25 MG capsule  . ibuprofen (ADVIL,MOTRIN) 200  MG tablet  . levothyroxine (SYNTHROID, LEVOTHROID) 75 MCG tablet  . LINZESS 145 MCG CAPS capsule  . Magnesium 500 MG CAPS  . Melatonin 3 MG CAPS  . metoprolol succinate (TOPROL-XL) 25 MG 24 hr tablet  . olmesartan-hydrochlorothiazide (BENICAR HCT) 40-12.5 MG tablet  . polyethylene glycol powder (MIRALAX) powder  . potassium chloride (KLOR-CON 10) 10 MEQ tablet  . predniSONE (DELTASONE) 10 MG tablet  . primidone (MYSOLINE) 50 MG tablet  . rOPINIRole (REQUIP) 0.5 MG tablet  . traMADol (ULTRAM) 50 MG tablet  . vitamin B-12 (CYANOCOBALAMIN) 1000 MCG tablet   No current facility-administered  medications for this encounter.   She is no longer on prednisone. [name] Ellipta was changed to [employer].    Myra Gianotti, PA-C Surgical Short Stay/Anesthesiology [facility] Phone [phone] [facility] Phone [phone] 06/22/2018 10:09 PM

## 2018-06-22 NOTE — Pre-Procedure Instructions (Signed)
Brandi Stephens  06/22/2018      [facility] [address], [address] [place] [address] Phone: [phone] Fax: [phone]    Your procedure is scheduled on March 13th.  Report to [facility] Entrance "A" Admitting at 11:00 A.M.  Call this number if you have problems the morning of surgery:  [phone]   Remember:  Do not eat or drink after midnight.    Take these medicines the morning of surgery with A SIP OF WATER   Albuterol Inhaler - if needed (bring with you)  Allopurinol (Zyloprim)  Anastrozole (Arimidex)  Atorvastatin (Lipitor)  Buspirone (Buspar)  Colchicine - if needed  Fluticasone furoate-vilanterol (Breo Ellipta) Inhaler (bring with you)  Fluticasone-Umeclidin-Vilant ([name] Ellipta) (bring with you)  Hydroxyzine (Vistaril)  Levothyroxine (Synthroid)  Linzess  Metoprolol succinate (Toprol XL)  Prednisone (Deltasone)  Tramadol (Ultram) - if needed  7 days prior to surgery STOP taking any Aspirin (unless otherwise instructed by your surgeon), Aleve, Naproxen, Ibuprofen, Motrin, Advil, Goody's, BC's, all herbal medications, fish oil, and all vitamins.     Do not wear jewelry, make-up or nail polish.  Do not wear lotions, powders, or perfumes, or deodorant.  Do not shave 48 hours prior to surgery.    Do not bring valuables to the hospital.  [facility] is not responsible for any belongings or valuables.   [place]- Preparing For Surgery  Before surgery, you can play an important role. Because skin is not sterile, your skin needs to be as free of germs as possible. You can reduce the number of germs on your skin by washing with CHG (chlorahexidine gluconate) Soap before surgery.  CHG is an antiseptic cleaner which kills germs and bonds with the skin to continue killing germs even after washing.    Oral Hygiene is also important to reduce your risk of  infection.  Remember - BRUSH YOUR TEETH THE MORNING OF SURGERY WITH YOUR REGULAR TOOTHPASTE  Please do not use if you have an allergy to CHG or antibacterial soaps. If your skin becomes reddened/irritated stop using the CHG.  Do not shave (including legs and underarms) for at least 48 hours prior to first CHG shower. It is OK to shave your face.  Please follow these instructions carefully.   1. Shower the NIGHT BEFORE SURGERY and the MORNING OF SURGERY with CHG.   2. If you chose to wash your hair, wash your hair first as usual with your normal shampoo.  3. After you shampoo, rinse your hair and body thoroughly to remove the shampoo.  4. Use CHG as you would any other liquid soap. You can apply CHG directly to the skin and wash gently with a scrungie or a clean washcloth.   5. Apply the CHG Soap to your body ONLY FROM THE NECK DOWN.  Do not use on open wounds or open sores. Avoid contact with your eyes, ears, mouth and genitals (private parts). Wash Face and genitals (private parts)  with your normal soap.  6. Wash thoroughly, paying special attention to the area where your surgery will be performed.  7. Thoroughly rinse your body with warm water from the neck down.  8. DO NOT shower/wash with your normal soap after using and rinsing off the CHG Soap.  9. Pat yourself dry with a CLEAN TOWEL.  10. Wear CLEAN PAJAMAS to bed the night before surgery,  wear comfortable clothes the morning of surgery  11. Place CLEAN SHEETS on your bed the night of your first shower and DO NOT SLEEP WITH PETS.   Day of Surgery:  Do not apply any deodorants/lotions.  Please wear clean clothes to the hospital/surgery center.   Remember to brush your teeth WITH YOUR REGULAR TOOTHPASTE.   Contacts, eyeglasses, hearing aids, dentures or bridgework may not be worn into surgery.  Leave your suitcase in the car.  After surgery it may be brought to your room.  For patients admitted to the hospital, discharge  time will be determined by your treatment team.  Patients discharged the day of surgery will not be allowed to drive home.    Please read over the following fact sheets that you were given. Coughing and Deep Breathing and Surgical Site Infection Prevention

## 2018-06-22 NOTE — Anesthesia Preprocedure Evaluation (Addendum)
Anesthesia Evaluation  Patient identified by MRN, date of birth, ID band Patient awake    Reviewed: Allergy & Precautions, NPO status , Patient's Chart, lab work & pertinent test results  History of Anesthesia Complications (+) PONV  Airway Mallampati: II  TM Distance: >3 FB Neck ROM: Full    Dental  (+) Dental Advisory Given   Pulmonary pneumonia, resolved, COPD, former smoker,    breath sounds clear to auscultation       Cardiovascular hypertension, Pt. on medications and Pt. on home beta blockers  Rhythm:Regular Rate:Normal     Neuro/Psych negative neurological ROS     GI/Hepatic Neg liver ROS, GERD  ,  Endo/Other  Hypothyroidism   Renal/GU Renal disease     Musculoskeletal  (+) Arthritis ,   Abdominal   Peds  Hematology negative hematology ROS (+)   Anesthesia Other Findings   Reproductive/Obstetrics                            Lab Results  Component Value Date   WBC 9.9 06/22/2018   HGB 14.0 06/22/2018   HCT 43.7 06/22/2018   MCV 99.1 06/22/2018   PLT 244 06/22/2018   Lab Results  Component Value Date   CREATININE 1.22 (H) 06/22/2018   BUN 26 (H) 06/22/2018   NA 141 06/22/2018   K 5.2 (H) 06/22/2018   CL 114 (H) 06/22/2018   CO2 20 (L) 06/22/2018    Anesthesia Physical Anesthesia Plan  ASA: III  Anesthesia Plan: General   Post-op Pain Management:    Induction: Intravenous  PONV Risk Score and Plan: 4 or greater and Scopolamine patch - Pre-op, Midazolam, Dexamethasone, Ondansetron and Treatment may vary due to age or medical condition  Airway Management Planned: Oral ETT  Additional Equipment:   Intra-op Plan:   Post-operative Plan: Extubation in OR and Possible Post-op intubation/ventilation  Informed Consent: I have reviewed the patients History and Physical, chart, labs and discussed the procedure including the risks, benefits and alternatives for the  proposed anesthesia with the patient or authorized representative who has indicated his/her understanding and acceptance.     Dental advisory given  Plan Discussed with: CRNA  Anesthesia Plan Comments: ( )       Anesthesia Quick Evaluation

## 2018-06-23 ENCOUNTER — Other Ambulatory Visit: Payer: Self-pay | Admitting: Internal Medicine

## 2018-06-25 ENCOUNTER — Other Ambulatory Visit: Payer: Self-pay | Admitting: Internal Medicine

## 2018-06-27 NOTE — H&P (Signed)
Chief Complaint   Back pain  HPI   HPI: Brandi Stephens is a 73 y.o. female s/p L4-5 and L5-S1 approximately 3 years ago who was found to have significant adjacent level disease at L3-4 during workup for recurrent back and bilateral lower extremity pain.   Pain is worse with standing and walking.   Lying flat will help improve the pain. She presents today for surgical decompression.  She is without any concerns.   Patient Active Problem List   Diagnosis Date Noted  . Preoperative clearance 06/04/2018  . Abnormal CT of the chest 06/04/2018  . Smoker 05/17/2018  . History of COPD 05/17/2018  . Hypothyroidism 11/30/2017  . Thoracic aortic atherosclerosis ([place]) 09/14/2016  . Benign essential tremor 09/09/2016  . RLS (restless legs syndrome) 09/09/2016  . H/O adenomatous polyp of colon 07/06/2016  . PVC's (premature ventricular contractions) 03/29/2016  . Preoperative cardiovascular examination 03/29/2016  . Palpitations 01/11/2016  . Shortness of breath 01/11/2016  . Other fatigue 01/11/2016  . Former cigarette smoker 01/11/2016  . Tachycardia 01/11/2016  . Leg edema 01/11/2016  . Chronic cough 10/28/2015  . 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 Malignant neoplasm of upper-inner quadrant of left breast in female, estrogen receptor positive ([place]) 07/14/2015  . Prediabetes 07/08/2015  . Chronic kidney disease 07/08/2015  . Dependent edema 06/08/2015  . Sleep disturbance 06/08/2015  . Hyperlipidemia 06/08/2015  . Generalized anxiety disorder 06/08/2015  . Essential hypertension 06/08/2015  . Mild emphysema ([place]) 06/08/2015  . Gout 06/08/2015  . Macrocytosis   . 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 Lumbar spondylosis 03/20/2015  . Paresthesias 04/01/2013    PMH: Past Medical History:  Diagnosis Date  . AIN (anal intraepithelial neoplasia) anal canal   . Anemia    with pregnancy  . Anxiety   . Arthritis    knees, lower back, knees  . Chronic constipation   .  Coarse tremors    Essential  . COPD (chronic obstructive pulmonary disease) ([place])    2017 chest CT, history of  . Degenerative lumbar spinal stenosis   . GERD (gastroesophageal reflux disease)    pt denies  . Gout   . Grade I diastolic dysfunction 63/78/5885   Noted ECHO  . Hemorrhoids   . History of adenomatous polyp of colon   . History of basal cell carcinoma (BCC) excision 2015   nose  . History of sepsis 05/14/2014   post lumbar surgery  . History of shingles    x2  . Hyperlipidemia   . Hypertension   . Hypothyroidism   . Irregular heart beat    history of while undergoing radiation  . Malignant neoplasm of upper-inner quadrant of left breast in female, estrogen receptor positive [facility]) oncologist-  dr Jana Hakim--  per lov in epic no recurrence   dx 07-13-2015  Left breast invasive ductal carcinoma, Stage IA, Grade 1 (TXN0),  09-16-2015  s/p  left breast lumpectomy with sln bx's,  completed radiation 11-18-2015,  started antiestrogen therapy 12-14-2015  . Neuropathy    Left foot and back of left leg  . Personal history of radiation therapy    completed 11-18-2015  left breast  . Pneumonia   . PONV (postoperative nausea and vomiting)   . Prediabetes    diet controlled, no med  . Restless leg syndrome   . Seasonal allergies   . Seizure ([place]) 05/2015   due to sepsis, only one time episode  . Wears partial dentures  lower     PSH: Past Surgical History:  Procedure Laterality Date  . ABDOMINAL HYSTERECTOMY  1985  . BASAL CELL CARCINOMA EXCISION  10/15   nose  . BREAST BIOPSY Right 08/24/2015  . BREAST BIOPSY Right 07/13/2015  . BREAST BIOPSY Left 07/13/2015  . BREAST EXCISIONAL BIOPSY Left 1972  . BREAST LUMPECTOMY WITH RADIOACTIVE SEED AND SENTINEL LYMPH NODE BIOPSY Left 09/16/2015   Procedure: BREAST LUMPECTOMY WITH RADIOACTIVE SEED AND SENTINEL LYMPH NODE BIOPSY;  Surgeon: Stark Klein, MD;  Location: [facility];  Service: General;  Laterality:  Left;  . BREAST REDUCTION SURGERY Bilateral 1998  . CATARACT EXTRACTION W/ INTRAOCULAR LENS IMPLANT Left 2016  . [place]   hallo  . COLONOSCOPY  01/2013   polyps  . EYE SURGERY Left 2016  . HAND SURGERY Left 02-19-2002   dr Fredna Dow  @[facility]    repair collateral ligament/ MPJ left thumb  . [place]  2008  . HIGH RESOLUTION ANOSCOPY N/A 02/28/2018   Procedure: HIGH RESOLUTION ANOSCOPY WITH BIOPSY;  Surgeon: Leighton Ruff, MD;  Location: [facility];  Service: General;  Laterality: N/A;  . KNEE ARTHROSCOPY Right 2002  . LUMBAR SPINE SURGERY  03-20-2015   dr Kathyrn Sheriff   fusion L4-5, L5-S1  . MOUTH SURGERY  07/14/2018   2 infected teeth removed  . MULTIPLE TOOTH EXTRACTIONS     with bone grafting lower bottom right and left  . RIGHT COLECTOMY  09-10-2003   dr Margot Chimes @[facility]    multiple colon polyps (per path tubular adenoma's, hyperplastic , benign appendix, benign two lymph nodes  . ROTATOR CUFF REPAIR Left 04/2016  . TONSILLECTOMY  1969  . TUBAL LIGATION Bilateral 1982    No medications prior to admission.    SH: Social History   Tobacco Use  . Smoking status: Former Smoker    Packs/day: 0.50    Years: 35.00    Pack years: 17.50    Types: Cigarettes    Last attempt to quit: 05/22/2018    Years since quitting: 0.0  . Smokeless tobacco: Never Used  Substance Use Topics  . Alcohol use: Yes    Alcohol/week: 9.0 standard drinks    Types: 9 Glasses of wine per week    Comment: 1 drink nightly, 2 on weekends  . Drug use: No    MEDS: Prior to Admission medications   Medication Sig Start Date End Date Taking? Authorizing Provider  albuterol (PROVENTIL HFA;VENTOLIN HFA) 108 (90 Base) MCG/ACT inhaler INHALE 2 PUFFS BY MOUTH EVERY 4 TO 6 HOURS AS NEEDED 05/03/18   Henson, Vickie L, NP-C  allopurinol (ZYLOPRIM) 100 MG tablet TAKE 2 TABLETS(200 MG) BY MOUTH DAILY 05/02/18   Henson, Vickie L, NP-C  anastrozole (ARIMIDEX) 1 MG tablet Take 1 tablet  (1 mg total) by mouth daily. 05/07/18   Magrinat, Virgie Dad, MD  atorvastatin (LIPITOR) 20 MG tablet TAKE 1 TABLET BY MOUTH EVERY DAY 05/07/18   Henson, Vickie L, NP-C  baclofen (LIORESAL) 10 MG tablet Take 1 tablet (10 mg total) by mouth 3 (three) times daily as needed. Patient taking differently: Take 10 mg by mouth at bedtime.  04/03/18   Henson, Vickie L, NP-C  Biotin (BIOTIN 5000) 5 MG CAPS Take 5 mg by mouth daily.     [provider]  busPIRone (BUSPAR) 15 MG tablet Take 15 mg by mouth 3 (three) times daily.  09/28/17   [provider]  Calcium Carbonate-Vitamin D (CALCIUM 500/D  PO) Take 2 each by mouth daily.     [provider]  colchicine 0.6 MG tablet Take 2 tablets now and 1 tablet an hour later for flare up Patient taking differently: Take 0.6 mg by mouth daily as needed (for gout flare up).  07/10/17   Henson, Vickie L, NP-C  Eszopiclone (ESZOPICLONE) 3 MG TABS Take 3 mg by mouth at bedtime. Take immediately before bedtime    [provider]  fluticasone furoate-vilanterol (BREO ELLIPTA) 100-25 MCG/INH AEPB Inhale 1 puff into the lungs daily. 06/04/18   Parrett, Fonnie Mu, NP  Fluticasone-Umeclidin-Vilant ([name] ELLIPTA) 100-62.5-25 MCG/INH AEPB Inhale 1 puff into the lungs daily. 05/22/18   Olalere, Cicero Duck A, MD  furosemide (LASIX) 20 MG tablet Take 1 tablet (20 mg total) by mouth daily as needed. Reported on 10/28/2015 Patient taking differently: Take 20 mg by mouth daily as needed for fluid.  12/04/17   Henson, Vickie L, NP-C  hydrOXYzine (VISTARIL) 25 MG capsule Take 25 mg by mouth 2 (two) times daily as needed for anxiety.    [provider]  ibuprofen (ADVIL,MOTRIN) 200 MG tablet Take 400-600 mg by mouth every 6 (six) hours as needed for headache or moderate pain.    [provider]  levothyroxine (SYNTHROID, LEVOTHROID) 75 MCG tablet TAKE 1 TABLET BY MOUTH EVERY DAY Patient taking differently: Take 75 mcg by mouth daily before  breakfast.  03/08/18   Henson, Vickie L, NP-C  LINZESS 145 MCG CAPS capsule TAKE 1 CAPSULE BY MOUTH EVERY DAY BEFORE BREAKFAST Patient taking differently: Take 145 mcg by mouth daily before breakfast.  03/08/18   Henson, Vickie L, NP-C  Magnesium 500 MG CAPS Take 500 mg by mouth daily.    [provider]  Melatonin 3 MG CAPS Take 9 mg by mouth at bedtime.    [provider]  metoprolol succinate (TOPROL-XL) 25 MG 24 hr tablet TAKE 1/2 TABLET(12.5 MG) BY MOUTH DAILY 06/25/18   Hilty, Nadean Corwin, MD  olmesartan-hydrochlorothiazide (BENICAR HCT) 40-12.5 MG tablet Take 1 tablet by mouth daily. 05/07/18   Henson, Vickie L, NP-C  polyethylene glycol powder (MIRALAX) powder Take 17 g by mouth daily as needed for moderate constipation.     [provider]  potassium chloride (KLOR-CON 10) 10 MEQ tablet Take 1 tablet (10 mEq total) by mouth as needed. Reported on 10/28/2015 Patient taking differently: Take 10 mEq by mouth daily as needed (with Lasix).  12/04/17   Henson, Vickie L, NP-C  predniSONE (DELTASONE) 10 MG tablet Take 1 tablet (10 mg total) by mouth 2 (two) times daily with a meal. 05/22/18   Olalere, Adewale A, MD  primidone (MYSOLINE) 50 MG tablet TAKE 1 TABLET(50 MG) BY MOUTH AT BEDTIME Patient taking differently: Take 50 mg by mouth at bedtime.  04/02/18   Henson, Vickie L, NP-C  rOPINIRole (REQUIP) 0.5 MG tablet Take 1.5 tablets (0.75 mg total) by mouth at bedtime. 04/20/18   Patel, Arvin Collard K, DO  traMADol (ULTRAM) 50 MG tablet Take 1 tablet (50 mg total) by mouth every 6 (six) hours as needed for moderate pain. 05/17/18   Henson, Vickie L, NP-C  vitamin B-12 (CYANOCOBALAMIN) 1000 MCG tablet Take 1,000 mcg by mouth daily.    [provider]    ALLERGY: Allergies  Allergen Reactions  . Ampicillin Hives and Other (See Comments)    Severe reaction in February 2017 1.5 month to have hives to go away  . Penicillins Other (See Comments)  Severe reaction in February  2017 1.5 month to have hives to go away Has patient had a PCN reaction causing immediate rash, facial/tongue/throat swelling, SOB or lightheadedness with hypotension: #  #  #  YES  #  #  #  Has patient had a PCN reaction causing severe rash involving mucus membranes or skin necrosis: No Has patient had a PCN reaction that required hospitalization No Has patient had a PCN reaction occurring within the last 10 years: No   . Morphine And Related Itching    Patient states she is not allergic to morphine  . Other Itching    UNSPECIFIED Analgesics  . Codeine Nausea And Vomiting  . Hydrocodone-Acetaminophen Itching  . Hydromorphone Itching    Pt has taken w/o issues    Social History   Tobacco Use  . Smoking status: Former Smoker    Packs/day: 0.50    Years: 35.00    Pack years: 17.50    Types: Cigarettes    Last attempt to quit: 05/22/2018    Years since quitting: 0.0  . Smokeless tobacco: Never Used  Substance Use Topics  . Alcohol use: Yes    Alcohol/week: 9.0 standard drinks    Types: 9 Glasses of wine per week    Comment: 1 drink nightly, 2 on weekends     Family History  Problem Relation Age of Onset  . Atrial fibrillation Mother   . Breast cancer Maternal Aunt   . Lung cancer Maternal Uncle   . Stomach cancer Maternal Grandmother   . Lung cancer Maternal Aunt   . Brain cancer Maternal Aunt   . Prostate cancer Maternal Grandfather   . Stroke Paternal Grandmother   . Cancer Paternal Grandfather   . Colon cancer Neg Hx   . Esophageal cancer Neg Hx   . Rectal cancer Neg Hx      ROS   ROS  Exam   There were no vitals filed for this visit. General appearance: WDWN, NAD Eyes: No scleral injection Cardiovascular: Regular rate and rhythm without murmurs, rubs, gallops. No edema or variciosities. Distal pulses normal. Pulmonary: Effort normal, non-labored breathing Musculoskeletal:     Muscle tone upper extremities: Normal    Muscle tone lower extremities: Normal     Motor exam: Upper Extremities Deltoid Bicep Tricep Grip  Right 5/5 5/5 5/5 5/5  Left 5/5 5/5 5/5 5/5   Lower Extremity IP Quad PF DF EHL  Right 5/5 5/5 5/5 5/5 5/5  Left 5/5 5/5 5/5 5/5 5/5   Neurological Mental Status:    - Patient is awake, alert, oriented to person, place, month, year, and situation    - Patient is able to give a clear and coherent history.    - No signs of aphasia or neglect Cranial Nerves    - II: Visual Fields are full. PERRL    - III/IV/VI: EOMI without ptosis or diploplia.     - V: Facial sensation is grossly normal    - VII: Facial movement is symmetric.     - VIII: hearing is intact to voice    - X: Uvula elevates symmetrically    - XI: Shoulder shrug is symmetric.    - XII: tongue is midline without atrophy or fasciculations.  Sensory: Sensation grossly intact to LT  Results - Imaging/Labs   No results found for this or any previous visit (from the past 48 hour(s)).  No results found.  IMAGING: CT myelogram dated 02/20/2018 was reviewed.  This  demonstrates good position of the previously implanted hardware at L4-L5 and S1.  There does appear to be good interbody fusion at L5-S1, and by my interpretation does appear to also demonstrate good interbody fusion at L4-5.  There is large broad-based disc bulge at L3-4, with ligamentous hypertrophy, and bilateral facet arthropathy along with significant disc disease with vacuum disc phenomenon.  There is resultant severe central stenosis with near complete myelographic block at this level.  In comparison to prior MRI of the lumbar spine dated October 2017, there has been significant progression of stenosis.   Impression/Plan   73 y.o. female   With back and leg pain related to severe progressive stenosis at L3-4 adjacent to previous decompression and fusion from L4-S1.  We will plan on proceeding with decompression at L3-4 and extension of the fusion from L3-S1.  While in the office the risks of the  procedure which she is familiar with.  These risks include but are not limited to nerve root injury leading to leg or foot weakness/numbness and/or bowel and bladder dysfunction, CSF leak, bleeding, and infection.  Possible outcomes of surgery were also discussed including the possibility of persistence or worsening of pain symptoms and the possibility of continued adjacent level degeneration. The general risks of anesthesia were also reviewed including heart attack, stroke, and DVT/PE.  We discussed expected postoperative course and recovery.  The patient understood our discussion and is willing to proceed with surgical decompression and fusion.  All questions were answered.

## 2018-06-28 ENCOUNTER — Other Ambulatory Visit: Payer: Self-pay | Admitting: Neurosurgery

## 2018-06-28 MED ORDER — VANCOMYCIN HCL 10 G IV SOLR
1500.0000 mg | INTRAVENOUS | Status: AC
  Administered 2018-06-29: 1500 mg via INTRAVENOUS
  Filled 2018-06-28: qty 1500

## 2018-06-29 ENCOUNTER — Inpatient Hospital Stay (HOSPITAL_COMMUNITY): Payer: PPO

## 2018-06-29 ENCOUNTER — Inpatient Hospital Stay (HOSPITAL_COMMUNITY)
Admission: RE | Admit: 2018-06-29 | Discharge: 2018-06-30 | DRG: 455 | Disposition: A | Discharge status: Home / Self Care | Payer: PPO | Attending: Neurosurgery | Admitting: Neurosurgery

## 2018-06-29 ENCOUNTER — Encounter (HOSPITAL_COMMUNITY)
Admission: RE | Disposition: A | Discharge status: Home / Self Care | Payer: Self-pay | Source: Home / Self Care | Attending: Neurosurgery

## 2018-06-29 ENCOUNTER — Inpatient Hospital Stay (HOSPITAL_COMMUNITY): Payer: PPO | Admitting: Anesthesiology

## 2018-06-29 ENCOUNTER — Inpatient Hospital Stay (HOSPITAL_COMMUNITY): Payer: PPO | Admitting: Vascular Surgery

## 2018-06-29 ENCOUNTER — Encounter (HOSPITAL_COMMUNITY): Payer: Self-pay | Admitting: *Deleted

## 2018-06-29 ENCOUNTER — Other Ambulatory Visit: Payer: Self-pay

## 2018-06-29 DIAGNOSIS — F419 Anxiety disorder, unspecified: Secondary | ICD-10-CM | POA: Diagnosis not present

## 2018-06-29 DIAGNOSIS — Z885 Allergy status to narcotic agent status: Secondary | ICD-10-CM

## 2018-06-29 DIAGNOSIS — Z800 Family history of malignant neoplasm of digestive organs: Secondary | ICD-10-CM

## 2018-06-29 DIAGNOSIS — Z853 Personal history of malignant neoplasm of breast: Secondary | ICD-10-CM

## 2018-06-29 DIAGNOSIS — M545 Low back pain: Secondary | ICD-10-CM | POA: Diagnosis not present

## 2018-06-29 DIAGNOSIS — Z808 Family history of malignant neoplasm of other organs or systems: Secondary | ICD-10-CM | POA: Diagnosis not present

## 2018-06-29 DIAGNOSIS — Z803 Family history of malignant neoplasm of breast: Secondary | ICD-10-CM

## 2018-06-29 DIAGNOSIS — Z801 Family history of malignant neoplasm of trachea, bronchus and lung: Secondary | ICD-10-CM | POA: Diagnosis not present

## 2018-06-29 DIAGNOSIS — Z86010 Personal history of colonic polyps: Secondary | ICD-10-CM

## 2018-06-29 DIAGNOSIS — E785 Hyperlipidemia, unspecified: Secondary | ICD-10-CM | POA: Diagnosis not present

## 2018-06-29 DIAGNOSIS — G629 Polyneuropathy, unspecified: Secondary | ICD-10-CM | POA: Diagnosis present

## 2018-06-29 DIAGNOSIS — M48062 Spinal stenosis, lumbar region with neurogenic claudication: Secondary | ICD-10-CM | POA: Diagnosis not present

## 2018-06-29 DIAGNOSIS — Z79890 Hormone replacement therapy (postmenopausal): Secondary | ICD-10-CM | POA: Diagnosis not present

## 2018-06-29 DIAGNOSIS — Z170 Estrogen receptor positive status [ER+]: Secondary | ICD-10-CM | POA: Diagnosis not present

## 2018-06-29 DIAGNOSIS — J449 Chronic obstructive pulmonary disease, unspecified: Secondary | ICD-10-CM | POA: Diagnosis present

## 2018-06-29 DIAGNOSIS — Z419 Encounter for procedure for purposes other than remedying health state, unspecified: Secondary | ICD-10-CM

## 2018-06-29 DIAGNOSIS — M48061 Spinal stenosis, lumbar region without neurogenic claudication: Secondary | ICD-10-CM | POA: Diagnosis present

## 2018-06-29 DIAGNOSIS — E039 Hypothyroidism, unspecified: Secondary | ICD-10-CM | POA: Diagnosis present

## 2018-06-29 DIAGNOSIS — N189 Chronic kidney disease, unspecified: Secondary | ICD-10-CM | POA: Diagnosis not present

## 2018-06-29 DIAGNOSIS — K219 Gastro-esophageal reflux disease without esophagitis: Secondary | ICD-10-CM | POA: Diagnosis not present

## 2018-06-29 DIAGNOSIS — I129 Hypertensive chronic kidney disease with stage 1 through stage 4 chronic kidney disease, or unspecified chronic kidney disease: Secondary | ICD-10-CM | POA: Diagnosis present

## 2018-06-29 DIAGNOSIS — R7303 Prediabetes: Secondary | ICD-10-CM | POA: Diagnosis present

## 2018-06-29 DIAGNOSIS — Z981 Arthrodesis status: Secondary | ICD-10-CM

## 2018-06-29 DIAGNOSIS — Z886 Allergy status to analgesic agent status: Secondary | ICD-10-CM

## 2018-06-29 DIAGNOSIS — M109 Gout, unspecified: Secondary | ICD-10-CM | POA: Diagnosis not present

## 2018-06-29 DIAGNOSIS — Z880 Allergy status to penicillin: Secondary | ICD-10-CM

## 2018-06-29 DIAGNOSIS — Z923 Personal history of irradiation: Secondary | ICD-10-CM | POA: Diagnosis not present

## 2018-06-29 DIAGNOSIS — Z823 Family history of stroke: Secondary | ICD-10-CM | POA: Diagnosis not present

## 2018-06-29 DIAGNOSIS — F411 Generalized anxiety disorder: Secondary | ICD-10-CM | POA: Diagnosis present

## 2018-06-29 DIAGNOSIS — I700 Atherosclerosis of aorta: Secondary | ICD-10-CM | POA: Diagnosis present

## 2018-06-29 DIAGNOSIS — M4316 Spondylolisthesis, lumbar region: Secondary | ICD-10-CM | POA: Diagnosis not present

## 2018-06-29 DIAGNOSIS — M4326 Fusion of spine, lumbar region: Secondary | ICD-10-CM | POA: Diagnosis not present

## 2018-06-29 DIAGNOSIS — Z87891 Personal history of nicotine dependence: Secondary | ICD-10-CM | POA: Diagnosis not present

## 2018-06-29 DIAGNOSIS — Z79899 Other long term (current) drug therapy: Secondary | ICD-10-CM

## 2018-06-29 DIAGNOSIS — M170 Bilateral primary osteoarthritis of knee: Secondary | ICD-10-CM | POA: Diagnosis present

## 2018-06-29 DIAGNOSIS — Z85828 Personal history of other malignant neoplasm of skin: Secondary | ICD-10-CM

## 2018-06-29 DIAGNOSIS — Z8619 Personal history of other infectious and parasitic diseases: Secondary | ICD-10-CM

## 2018-06-29 SURGERY — POSTERIOR LUMBAR FUSION 1 LEVEL
Anesthesia: General | Site: Spine Lumbar

## 2018-06-29 MED ORDER — SCOPOLAMINE 1 MG/3DAYS TD PT72
1.0000 | MEDICATED_PATCH | TRANSDERMAL | Status: DC
  Administered 2018-06-29: 1.5 mg via TRANSDERMAL

## 2018-06-29 MED ORDER — VANCOMYCIN HCL IN DEXTROSE 1-5 GM/200ML-% IV SOLN
INTRAVENOUS | Status: AC
  Filled 2018-06-29: qty 200

## 2018-06-29 MED ORDER — PHENOL 1.4 % MT LIQD: 1.0000 | OROMUCOSAL | Status: DC | PRN

## 2018-06-29 MED ORDER — ROCURONIUM BROMIDE 10 MG/ML (PF) SYRINGE
PREFILLED_SYRINGE | INTRAVENOUS | Status: DC | PRN
  Administered 2018-06-29: 20 mg via INTRAVENOUS
  Administered 2018-06-29 (×2): 50 mg via INTRAVENOUS

## 2018-06-29 MED ORDER — CHLORHEXIDINE GLUCONATE CLOTH 2 % EX PADS: 6.0000 | MEDICATED_PAD | Freq: Once | CUTANEOUS | Status: DC

## 2018-06-29 MED ORDER — HYDROMORPHONE HCL 1 MG/ML IJ SOLN
0.2500 mg | INTRAMUSCULAR | Status: DC | PRN
  Administered 2018-06-29 (×4): 0.5 mg via INTRAVENOUS

## 2018-06-29 MED ORDER — THROMBIN 5000 UNITS EX SOLR
CUTANEOUS | Status: AC
  Filled 2018-06-29: qty 5000

## 2018-06-29 MED ORDER — METHOCARBAMOL 500 MG PO TABS
ORAL_TABLET | ORAL | Status: AC
  Filled 2018-06-29: qty 1

## 2018-06-29 MED ORDER — PROPOFOL 10 MG/ML IV BOLUS
INTRAVENOUS | Status: AC
  Filled 2018-06-29: qty 40

## 2018-06-29 MED ORDER — FLEET ENEMA 7-19 GM/118ML RE ENEM: 1.0000 | ENEMA | Freq: Once | RECTAL | Status: DC | PRN

## 2018-06-29 MED ORDER — BUSPIRONE HCL 15 MG PO TABS
15.0000 mg | ORAL_TABLET | Freq: Three times a day (TID) | ORAL | Status: DC
  Administered 2018-06-29 – 2018-06-30 (×2): 15 mg via ORAL
  Filled 2018-06-29 (×3): qty 1

## 2018-06-29 MED ORDER — HYDROCODONE-ACETAMINOPHEN 5-325 MG PO TABS: 1.0000 | ORAL_TABLET | ORAL | Status: DC | PRN

## 2018-06-29 MED ORDER — FENTANYL CITRATE (PF) 250 MCG/5ML IJ SOLN
INTRAMUSCULAR | Status: DC | PRN
  Administered 2018-06-29: 100 ug via INTRAVENOUS
  Administered 2018-06-29 (×3): 50 ug via INTRAVENOUS

## 2018-06-29 MED ORDER — SCOPOLAMINE 1 MG/3DAYS TD PT72
MEDICATED_PATCH | TRANSDERMAL | Status: AC
  Administered 2018-06-29: 1.5 mg via TRANSDERMAL
  Filled 2018-06-29: qty 1

## 2018-06-29 MED ORDER — MIDAZOLAM HCL 2 MG/2ML IJ SOLN
INTRAMUSCULAR | Status: AC
  Filled 2018-06-29: qty 2

## 2018-06-29 MED ORDER — POLYETHYLENE GLYCOL 3350 17 G PO PACK: 17.0000 g | PACK | Freq: Every day | ORAL | Status: DC | PRN

## 2018-06-29 MED ORDER — TRAMADOL HCL 50 MG PO TABS: 50.0000 mg | ORAL_TABLET | Freq: Four times a day (QID) | ORAL | Status: DC | PRN

## 2018-06-29 MED ORDER — ALLOPURINOL 100 MG PO TABS
200.0000 mg | ORAL_TABLET | Freq: Every day | ORAL | Status: DC
  Administered 2018-06-30: 200 mg via ORAL
  Filled 2018-06-29: qty 2

## 2018-06-29 MED ORDER — ONDANSETRON HCL 4 MG/2ML IJ SOLN
INTRAMUSCULAR | Status: DC | PRN
  Administered 2018-06-29: 4 mg via INTRAVENOUS

## 2018-06-29 MED ORDER — SODIUM CHLORIDE 0.9% FLUSH: 3.0000 mL | INTRAVENOUS | Status: DC | PRN

## 2018-06-29 MED ORDER — BISACODYL 10 MG RE SUPP: 10.0000 mg | Freq: Every day | RECTAL | Status: DC | PRN

## 2018-06-29 MED ORDER — IRBESARTAN 300 MG PO TABS
300.0000 mg | ORAL_TABLET | Freq: Every day | ORAL | Status: DC
  Administered 2018-06-30: 300 mg via ORAL
  Filled 2018-06-29 (×2): qty 1

## 2018-06-29 MED ORDER — FLUTICASONE-UMECLIDIN-VILANT 100-62.5-25 MCG/INH IN AEPB: 1.0000 | INHALATION_SPRAY | Freq: Every day | RESPIRATORY_TRACT | Status: DC

## 2018-06-29 MED ORDER — PRIMIDONE 50 MG PO TABS
50.0000 mg | ORAL_TABLET | Freq: Every day | ORAL | Status: DC
  Filled 2018-06-29: qty 1

## 2018-06-29 MED ORDER — SENNA 8.6 MG PO TABS
1.0000 | ORAL_TABLET | Freq: Two times a day (BID) | ORAL | Status: DC
  Administered 2018-06-29 – 2018-06-30 (×2): 8.6 mg via ORAL
  Filled 2018-06-29 (×2): qty 1

## 2018-06-29 MED ORDER — FUROSEMIDE 20 MG PO TABS: 20.0000 mg | ORAL_TABLET | Freq: Every day | ORAL | Status: DC | PRN

## 2018-06-29 MED ORDER — MIDAZOLAM HCL 2 MG/2ML IJ SOLN
INTRAMUSCULAR | Status: DC | PRN
  Administered 2018-06-29: 2 mg via INTRAVENOUS

## 2018-06-29 MED ORDER — HYDROXYZINE PAMOATE 25 MG PO CAPS
25.0000 mg | ORAL_CAPSULE | Freq: Two times a day (BID) | ORAL | Status: DC | PRN
  Filled 2018-06-29 (×2): qty 1

## 2018-06-29 MED ORDER — ZOLPIDEM TARTRATE 5 MG PO TABS: 5.0000 mg | ORAL_TABLET | Freq: Every evening | ORAL | Status: DC | PRN

## 2018-06-29 MED ORDER — BUPIVACAINE HCL (PF) 0.5 % IJ SOLN
INTRAMUSCULAR | Status: AC
  Filled 2018-06-29: qty 30

## 2018-06-29 MED ORDER — SODIUM CHLORIDE 0.9% FLUSH: 3.0000 mL | Freq: Two times a day (BID) | INTRAVENOUS | Status: DC

## 2018-06-29 MED ORDER — ONDANSETRON HCL 4 MG PO TABS: 4.0000 mg | ORAL_TABLET | Freq: Four times a day (QID) | ORAL | Status: DC | PRN

## 2018-06-29 MED ORDER — LIDOCAINE-EPINEPHRINE 1 %-1:100000 IJ SOLN
INTRAMUSCULAR | Status: DC | PRN
  Administered 2018-06-29: 5 mL

## 2018-06-29 MED ORDER — SUGAMMADEX SODIUM 200 MG/2ML IV SOLN
INTRAVENOUS | Status: DC | PRN
  Administered 2018-06-29: 100 mg via INTRAVENOUS
  Administered 2018-06-29: 50 mg via INTRAVENOUS
  Administered 2018-06-29: 100 mg via INTRAVENOUS

## 2018-06-29 MED ORDER — MENTHOL 3 MG MT LOZG: 1.0000 | LOZENGE | OROMUCOSAL | Status: DC | PRN

## 2018-06-29 MED ORDER — ACETAMINOPHEN 650 MG RE SUPP: 650.0000 mg | RECTAL | Status: DC | PRN

## 2018-06-29 MED ORDER — DEXAMETHASONE SODIUM PHOSPHATE 10 MG/ML IJ SOLN
INTRAMUSCULAR | Status: DC | PRN
  Administered 2018-06-29: 10 mg via INTRAVENOUS

## 2018-06-29 MED ORDER — LINACLOTIDE 145 MCG PO CAPS
145.0000 ug | ORAL_CAPSULE | Freq: Every day | ORAL | Status: DC
  Administered 2018-06-30: 145 ug via ORAL
  Filled 2018-06-29: qty 1

## 2018-06-29 MED ORDER — PROMETHAZINE HCL 25 MG/ML IJ SOLN: 6.2500 mg | INTRAMUSCULAR | Status: DC | PRN

## 2018-06-29 MED ORDER — ATORVASTATIN CALCIUM 10 MG PO TABS
20.0000 mg | ORAL_TABLET | Freq: Every day | ORAL | Status: DC
  Administered 2018-06-30: 20 mg via ORAL
  Filled 2018-06-29: qty 2

## 2018-06-29 MED ORDER — ACETAMINOPHEN 500 MG PO TABS
1000.0000 mg | ORAL_TABLET | Freq: Once | ORAL | Status: AC
  Administered 2018-06-29: 1000 mg via ORAL

## 2018-06-29 MED ORDER — PHENYLEPHRINE 40 MCG/ML (10ML) SYRINGE FOR IV PUSH (FOR BLOOD PRESSURE SUPPORT)
PREFILLED_SYRINGE | INTRAVENOUS | Status: AC
  Filled 2018-06-29: qty 10

## 2018-06-29 MED ORDER — FLUTICASONE FUROATE-VILANTEROL 100-25 MCG/INH IN AEPB
1.0000 | INHALATION_SPRAY | Freq: Every day | RESPIRATORY_TRACT | Status: DC
  Administered 2018-06-29: 1 via RESPIRATORY_TRACT
  Filled 2018-06-29: qty 28

## 2018-06-29 MED ORDER — DEXAMETHASONE SODIUM PHOSPHATE 10 MG/ML IJ SOLN
INTRAMUSCULAR | Status: AC
  Filled 2018-06-29: qty 1

## 2018-06-29 MED ORDER — METHOCARBAMOL 500 MG PO TABS
500.0000 mg | ORAL_TABLET | Freq: Four times a day (QID) | ORAL | Status: DC | PRN
  Administered 2018-06-29: 500 mg via ORAL
  Filled 2018-06-29: qty 1

## 2018-06-29 MED ORDER — LIDOCAINE 2% (20 MG/ML) 5 ML SYRINGE
INTRAMUSCULAR | Status: DC | PRN
  Administered 2018-06-29: 40 mg via INTRAVENOUS

## 2018-06-29 MED ORDER — POTASSIUM CHLORIDE ER 10 MEQ PO TBCR
10.0000 meq | EXTENDED_RELEASE_TABLET | Freq: Every day | ORAL | Status: DC | PRN
  Filled 2018-06-29: qty 1

## 2018-06-29 MED ORDER — ACETAMINOPHEN 500 MG PO TABS
1000.0000 mg | ORAL_TABLET | Freq: Four times a day (QID) | ORAL | Status: DC
  Administered 2018-06-29 – 2018-06-30 (×3): 1000 mg via ORAL
  Filled 2018-06-29 (×3): qty 2

## 2018-06-29 MED ORDER — GABAPENTIN 300 MG PO CAPS
300.0000 mg | ORAL_CAPSULE | Freq: Three times a day (TID) | ORAL | Status: DC
  Administered 2018-06-29 – 2018-06-30 (×2): 300 mg via ORAL
  Filled 2018-06-29 (×2): qty 1

## 2018-06-29 MED ORDER — ROCURONIUM BROMIDE 50 MG/5ML IV SOSY
PREFILLED_SYRINGE | INTRAVENOUS | Status: AC
  Filled 2018-06-29: qty 5

## 2018-06-29 MED ORDER — LEVOTHYROXINE SODIUM 75 MCG PO TABS
75.0000 ug | ORAL_TABLET | Freq: Every day | ORAL | Status: DC
  Administered 2018-06-30: 75 ug via ORAL
  Filled 2018-06-29: qty 1

## 2018-06-29 MED ORDER — METOPROLOL SUCCINATE ER 25 MG PO TB24
12.5000 mg | ORAL_TABLET | Freq: Every day | ORAL | Status: DC
  Administered 2018-06-30: 12.5 mg via ORAL
  Filled 2018-06-29: qty 1

## 2018-06-29 MED ORDER — PHENYLEPHRINE 40 MCG/ML (10ML) SYRINGE FOR IV PUSH (FOR BLOOD PRESSURE SUPPORT)
PREFILLED_SYRINGE | INTRAVENOUS | Status: DC | PRN
  Administered 2018-06-29: 120 ug via INTRAVENOUS

## 2018-06-29 MED ORDER — ACETAMINOPHEN 500 MG PO TABS
ORAL_TABLET | ORAL | Status: AC
  Administered 2018-06-29: 1000 mg via ORAL
  Filled 2018-06-29: qty 2

## 2018-06-29 MED ORDER — SODIUM CHLORIDE 0.9 % IV SOLN
INTRAVENOUS | Status: DC | PRN
  Administered 2018-06-29: 25 ug/min via INTRAVENOUS

## 2018-06-29 MED ORDER — VANCOMYCIN HCL IN DEXTROSE 1-5 GM/200ML-% IV SOLN
1000.0000 mg | Freq: Once | INTRAVENOUS | Status: AC
  Administered 2018-06-30: 1000 mg via INTRAVENOUS
  Filled 2018-06-29: qty 200

## 2018-06-29 MED ORDER — HYDROMORPHONE HCL 1 MG/ML IJ SOLN: 0.5000 mg | INTRAMUSCULAR | Status: DC | PRN

## 2018-06-29 MED ORDER — ONDANSETRON HCL 4 MG/2ML IJ SOLN: 4.0000 mg | Freq: Four times a day (QID) | INTRAMUSCULAR | Status: DC | PRN

## 2018-06-29 MED ORDER — MAGNESIUM OXIDE 400 (241.3 MG) MG PO TABS
400.0000 mg | ORAL_TABLET | Freq: Every day | ORAL | Status: DC
  Administered 2018-06-30: 400 mg via ORAL
  Filled 2018-06-29: qty 1

## 2018-06-29 MED ORDER — PROPOFOL 10 MG/ML IV BOLUS
INTRAVENOUS | Status: DC | PRN
  Administered 2018-06-29: 170 mg via INTRAVENOUS

## 2018-06-29 MED ORDER — THROMBIN 5000 UNITS EX SOLR
OROMUCOSAL | Status: DC | PRN
  Administered 2018-06-29: 15:00:00 via TOPICAL

## 2018-06-29 MED ORDER — FENTANYL CITRATE (PF) 250 MCG/5ML IJ SOLN
INTRAMUSCULAR | Status: AC
  Filled 2018-06-29: qty 5

## 2018-06-29 MED ORDER — ANASTROZOLE 1 MG PO TABS
1.0000 mg | ORAL_TABLET | Freq: Every day | ORAL | Status: DC
  Administered 2018-06-30: 1 mg via ORAL
  Filled 2018-06-29: qty 1

## 2018-06-29 MED ORDER — ONDANSETRON HCL 4 MG/2ML IJ SOLN
INTRAMUSCULAR | Status: AC
  Filled 2018-06-29: qty 2

## 2018-06-29 MED ORDER — OXYCODONE HCL 5 MG PO TABS
5.0000 mg | ORAL_TABLET | ORAL | Status: DC | PRN
  Administered 2018-06-29 – 2018-06-30 (×5): 10 mg via ORAL
  Filled 2018-06-29 (×4): qty 2

## 2018-06-29 MED ORDER — ACETAMINOPHEN 325 MG PO TABS: 650.0000 mg | ORAL_TABLET | ORAL | Status: DC | PRN

## 2018-06-29 MED ORDER — OXYCODONE HCL 5 MG PO TABS
ORAL_TABLET | ORAL | Status: AC
  Filled 2018-06-29: qty 2

## 2018-06-29 MED ORDER — SODIUM CHLORIDE 0.9 % IV SOLN: INTRAVENOUS | Status: DC

## 2018-06-29 MED ORDER — SENNOSIDES-DOCUSATE SODIUM 8.6-50 MG PO TABS: 1.0000 | ORAL_TABLET | Freq: Every evening | ORAL | Status: DC | PRN

## 2018-06-29 MED ORDER — DOCUSATE SODIUM 100 MG PO CAPS
100.0000 mg | ORAL_CAPSULE | Freq: Two times a day (BID) | ORAL | Status: DC
  Administered 2018-06-29 – 2018-06-30 (×2): 100 mg via ORAL
  Filled 2018-06-29 (×2): qty 1

## 2018-06-29 MED ORDER — LACTATED RINGERS IV SOLN
INTRAVENOUS | Status: DC
  Administered 2018-06-29 (×2): via INTRAVENOUS

## 2018-06-29 MED ORDER — ROPINIROLE HCL 0.5 MG PO TABS
0.7500 mg | ORAL_TABLET | Freq: Every day | ORAL | Status: DC
  Filled 2018-06-29: qty 1

## 2018-06-29 MED ORDER — OLMESARTAN MEDOXOMIL-HCTZ 40-12.5 MG PO TABS: 1.0000 | ORAL_TABLET | Freq: Every day | ORAL | Status: DC

## 2018-06-29 MED ORDER — BUPIVACAINE HCL (PF) 0.5 % IJ SOLN
INTRAMUSCULAR | Status: DC | PRN
  Administered 2018-06-29: 5 mL

## 2018-06-29 MED ORDER — PREDNISONE 10 MG PO TABS
10.0000 mg | ORAL_TABLET | Freq: Two times a day (BID) | ORAL | Status: DC
  Filled 2018-06-29 (×2): qty 1

## 2018-06-29 MED ORDER — LIDOCAINE-EPINEPHRINE 1 %-1:100000 IJ SOLN
INTRAMUSCULAR | Status: AC
  Filled 2018-06-29: qty 1

## 2018-06-29 MED ORDER — HYDROCHLOROTHIAZIDE 12.5 MG PO CAPS
12.5000 mg | ORAL_CAPSULE | Freq: Every day | ORAL | Status: DC
  Administered 2018-06-30: 12.5 mg via ORAL
  Filled 2018-06-29: qty 1

## 2018-06-29 MED ORDER — 0.9 % SODIUM CHLORIDE (POUR BTL) OPTIME
TOPICAL | Status: DC | PRN
  Administered 2018-06-29: 1000 mL

## 2018-06-29 MED ORDER — POLYETHYLENE GLYCOL 3350 17 GM/SCOOP PO POWD
17.0000 g | Freq: Every day | ORAL | Status: DC | PRN
  Filled 2018-06-29: qty 255

## 2018-06-29 MED ORDER — HYDROMORPHONE HCL 1 MG/ML IJ SOLN
INTRAMUSCULAR | Status: AC
  Filled 2018-06-29: qty 2

## 2018-06-29 MED ORDER — BACLOFEN 10 MG PO TABS
10.0000 mg | ORAL_TABLET | Freq: Every day | ORAL | Status: DC
  Administered 2018-06-29: 10 mg via ORAL
  Filled 2018-06-29: qty 1

## 2018-06-29 MED ORDER — LIDOCAINE 2% (20 MG/ML) 5 ML SYRINGE
INTRAMUSCULAR | Status: AC
  Filled 2018-06-29: qty 5

## 2018-06-29 MED ORDER — SODIUM CHLORIDE 0.9 % IV SOLN
INTRAVENOUS | Status: DC | PRN
  Administered 2018-06-29: 15:00:00

## 2018-06-29 MED ORDER — METHOCARBAMOL 1000 MG/10ML IJ SOLN
500.0000 mg | Freq: Four times a day (QID) | INTRAVENOUS | Status: DC | PRN
  Filled 2018-06-29: qty 5

## 2018-06-29 MED ORDER — VITAMIN B-12 1000 MCG PO TABS
1000.0000 ug | ORAL_TABLET | Freq: Every day | ORAL | Status: DC
  Administered 2018-06-30: 1000 ug via ORAL
  Filled 2018-06-29: qty 1

## 2018-06-29 MED ORDER — MELATONIN 3 MG PO TABS
9.0000 mg | ORAL_TABLET | Freq: Every day | ORAL | Status: DC
  Administered 2018-06-29: 9 mg via ORAL
  Filled 2018-06-29: qty 3

## 2018-06-29 SURGICAL SUPPLY — 73 items
ADH SKN CLS APL DERMABOND .7 (GAUZE/BANDAGES/DRESSINGS) ×2
BAG DECANTER FOR FLEXI CONT (MISCELLANEOUS) ×3 IMPLANT
BASKET BONE COLLECTION (BASKET) ×3 IMPLANT
BIT DRILL 3.5 POWEREASE (BIT) ×1 IMPLANT
BIT DRILL 3.5MM POWEREASE (BIT) ×1
BLADE SURG 11 STRL SS (BLADE) ×3 IMPLANT
BUR MATCHSTICK NEURO 3.0 LAGG (BURR) ×3 IMPLANT
BUR PRECISION FLUTE 5.0 (BURR) ×3 IMPLANT
CANISTER SUCT 3000ML PPV (MISCELLANEOUS) ×3 IMPLANT
CARTRIDGE OIL MAESTRO DRILL (MISCELLANEOUS) ×1 IMPLANT
CLOSURE WOUND 1/2 X4 (GAUZE/BANDAGES/DRESSINGS)
CONT SPEC 4OZ CLIKSEAL STRL BL (MISCELLANEOUS) ×3 IMPLANT
COVER BACK TABLE 60X90IN (DRAPES) ×3 IMPLANT
DERMABOND ADVANCED (GAUZE/BANDAGES/DRESSINGS) ×4
DERMABOND ADVANCED .7 DNX12 (GAUZE/BANDAGES/DRESSINGS) ×1 IMPLANT
DEVICE INTERBODY ELEVATE 23X8 (Cage) ×6 IMPLANT
DIFFUSER DRILL AIR PNEUMATIC (MISCELLANEOUS) ×3 IMPLANT
DRAPE C-ARM 42X72 X-RAY (DRAPES) ×3 IMPLANT
DRAPE C-ARMOR (DRAPES) ×3 IMPLANT
DRAPE LAPAROTOMY 100X72X124 (DRAPES) ×3 IMPLANT
DRAPE SURG 17X23 STRL (DRAPES) ×3 IMPLANT
DRSG OPSITE POSTOP 4X8 (GAUZE/BANDAGES/DRESSINGS) ×2 IMPLANT
DURAPREP 26ML APPLICATOR (WOUND CARE) ×3 IMPLANT
ELECT REM PT RETURN 9FT ADLT (ELECTROSURGICAL) ×3
ELECTRODE REM PT RTRN 9FT ADLT (ELECTROSURGICAL) ×1 IMPLANT
GAUZE 4X4 16PLY RFD (DISPOSABLE) IMPLANT
GAUZE SPONGE 4X4 12PLY STRL (GAUZE/BANDAGES/DRESSINGS) IMPLANT
GLOVE BIO SURGEON STRL SZ 6 (GLOVE) ×4 IMPLANT
GLOVE BIO SURGEON STRL SZ7.5 (GLOVE) ×4 IMPLANT
GLOVE BIOGEL PI IND STRL 7.0 (GLOVE) IMPLANT
GLOVE BIOGEL PI IND STRL 7.5 (GLOVE) ×2 IMPLANT
GLOVE BIOGEL PI INDICATOR 7.0 (GLOVE) ×6
GLOVE BIOGEL PI INDICATOR 7.5 (GLOVE) ×10
GLOVE ECLIPSE 7.0 STRL STRAW (GLOVE) ×6 IMPLANT
GLOVE ECLIPSE 7.5 STRL STRAW (GLOVE) ×4 IMPLANT
GOWN STRL REUS W/ TWL LRG LVL3 (GOWN DISPOSABLE) ×4 IMPLANT
GOWN STRL REUS W/ TWL XL LVL3 (GOWN DISPOSABLE) IMPLANT
GOWN STRL REUS W/TWL 2XL LVL3 (GOWN DISPOSABLE) ×2 IMPLANT
GOWN STRL REUS W/TWL LRG LVL3 (GOWN DISPOSABLE) ×12
GOWN STRL REUS W/TWL XL LVL3 (GOWN DISPOSABLE) ×6
HEMOSTAT POWDER KIT SURGIFOAM (HEMOSTASIS) ×3 IMPLANT
KIT BASIN OR (CUSTOM PROCEDURE TRAY) ×3 IMPLANT
KIT INFUSE XX SMALL 0.7CC (Orthopedic Implant) ×2 IMPLANT
KIT POSITION SURG JACKSON T1 (MISCELLANEOUS) ×3 IMPLANT
KIT TURNOVER KIT B (KITS) ×3 IMPLANT
MILL MEDIUM DISP (BLADE) ×3 IMPLANT
NDL HYPO 18GX1.5 BLUNT FILL (NEEDLE) IMPLANT
NDL SPNL 18GX3.5 QUINCKE PK (NEEDLE) IMPLANT
NEEDLE HYPO 18GX1.5 BLUNT FILL (NEEDLE) IMPLANT
NEEDLE HYPO 22GX1.5 SAFETY (NEEDLE) ×3 IMPLANT
NEEDLE SPNL 18GX3.5 QUINCKE PK (NEEDLE) IMPLANT
NS IRRIG 1000ML POUR BTL (IV SOLUTION) ×3 IMPLANT
OIL CARTRIDGE MAESTRO DRILL (MISCELLANEOUS) ×3
PACK LAMINECTOMY NEURO (CUSTOM PROCEDURE TRAY) ×3 IMPLANT
PAD ARMBOARD 7.5X6 YLW CONV (MISCELLANEOUS) ×9 IMPLANT
ROD 90MM (Rod) ×6 IMPLANT
ROD SPNL CVD 90X4.75X (Rod) IMPLANT
SCREW 5.5X35MM (Screw) ×6 IMPLANT
SCREW BN 35X5.5XMA NS SPNE (Screw) IMPLANT
SCREW SET SOLERA (Screw) ×24 IMPLANT
SCREW SET SOLERA TI (Screw) IMPLANT
SPACER SPNL XLORDOTIC 23X8X (Cage) IMPLANT
SPCR SPNL XLORDOTIC 23X8X (Cage) ×2 IMPLANT
SPONGE LAP 4X18 RFD (DISPOSABLE) IMPLANT
SPONGE SURGIFOAM ABS GEL 100 (HEMOSTASIS) IMPLANT
STRIP CLOSURE SKIN 1/2X4 (GAUZE/BANDAGES/DRESSINGS) IMPLANT
SUT VIC AB 0 CT1 18XCR BRD8 (SUTURE) ×1 IMPLANT
SUT VIC AB 0 CT1 8-18 (SUTURE) ×3
SUT VICRYL 3-0 RB1 18 ABS (SUTURE) ×3 IMPLANT
TOWEL GREEN STERILE (TOWEL DISPOSABLE) ×3 IMPLANT
TOWEL GREEN STERILE FF (TOWEL DISPOSABLE) ×3 IMPLANT
TRAY FOLEY MTR SLVR 16FR STAT (SET/KITS/TRAYS/PACK) ×3 IMPLANT
WATER STERILE IRR 1000ML POUR (IV SOLUTION) ×3 IMPLANT

## 2018-06-29 NOTE — Anesthesia Procedure Notes (Signed)
Procedure Name: Intubation Date/Time: 06/29/2018 1:36 PM Performed by: Valda Favia, CRNA Pre-anesthesia Checklist: Patient identified, Emergency Drugs available, Suction available, Patient being monitored and Timeout performed Patient Re-evaluated:Patient Re-evaluated prior to induction Oxygen Delivery Method: Circle system utilized Preoxygenation: Pre-oxygenation with 100% oxygen Induction Type: IV induction Ventilation: Mask ventilation without difficulty Laryngoscope Size: Mac and 4 Grade View: Grade I Tube type: Oral Tube size: 7.0 mm Number of attempts: 1 Airway Equipment and Method: Stylet Placement Confirmation: ETT inserted through vocal cords under direct vision,  positive ETCO2 and breath sounds checked- equal and bilateral Secured at: 21 cm Tube secured with: Tape Dental Injury: Teeth and Oropharynx as per pre-operative assessment

## 2018-06-29 NOTE — Transfer of Care (Signed)
Immediate Anesthesia Transfer of Care Note  Patient: Brandi Stephens  Procedure(s) Performed: LUMBAR THREE-FOUR POSTERIOR LUMBAR INTERBODY FUSION WITH EXTENSION OF FUSION (N/A Spine Lumbar)  Patient Location: PACU  Anesthesia Type:General  Level of Consciousness: awake, alert  and oriented  Airway & Oxygen Therapy: Patient Spontanous Breathing and Patient connected to nasal cannula oxygen  Post-op Assessment: Report given to RN and Post -op Vital signs reviewed and stable  Post vital signs: Reviewed and stable  Last Vitals:  Vitals Value Taken Time  BP    Temp    Pulse    Resp    SpO2      Last Pain:  Vitals:   06/29/18 1118  TempSrc: Oral         Complications: No apparent anesthesia complications

## 2018-06-29 NOTE — Progress Notes (Signed)
Pharmacy Antibiotic Note  Brandi Stephens is a 73 y.o. female admitted on 06/29/2018 with surgical prophylaxis.  Pharmacy has been consulted for vancomycin dosing. Patient is s/p lumbar fusion but has no drain in place.   Plan: -Vancomycin 1 gm IV once 12 hours after pre-op dose -Pharmacy to sign off  Height: 5\' 7"  (170.2 cm) Weight: 241 lb 13.5 oz (109.7 kg) IBW/kg (Calculated) : 61.6  Temp (24hrs), Avg:97.7 F (36.5 C), Min:97 F (36.1 C), Max:98.3 F (36.8 C)  No results for input(s): WBC, CREATININE, LATICACIDVEN, VANCOTROUGH, VANCOPEAK, VANCORANDOM, GENTTROUGH, GENTPEAK, GENTRANDOM, TOBRATROUGH, TOBRAPEAK, TOBRARND, AMIKACINPEAK, AMIKACINTROU, AMIKACIN in the last 168 hours.  Estimated Creatinine Clearance: 53.9 mL/min (A) (by C-G formula based on SCr of 1.22 mg/dL (H)).    Allergies  Allergen Reactions  . Ampicillin Hives and Other (See Comments)    Severe reaction in February 2017 1.5 month to have hives to go away  . Penicillins Other (See Comments)    Severe reaction in February 2017 1.5 month to have hives to go away Has patient had a PCN reaction causing immediate rash, facial/tongue/throat swelling, SOB or lightheadedness with hypotension: #  #  #  YES  #  #  #  Has patient had a PCN reaction causing severe rash involving mucus membranes or skin necrosis: No Has patient had a PCN reaction that required hospitalization No Has patient had a PCN reaction occurring within the last 10 years: No   . Morphine And Related Itching    Patient states she is not allergic to morphine  . Other Itching    UNSPECIFIED Analgesics  . Codeine Nausea And Vomiting  . Hydrocodone-Acetaminophen Itching  . Hydromorphone Itching    Pt has taken w/o issues     Thank you for allowing pharmacy to be a part of this patient's care.  Albertina Parr, PharmD., BCPS Clinical Pharmacist Clinical phone for 06/29/18 until 10:30pm: [phone] If after 10:30pm, please refer to [facility] for  unit-specific pharmacist

## 2018-06-29 NOTE — Brief Op Note (Signed)
06/29/2018  5:08 PM  PATIENT:  Brandi Stephens  73 y.o. female  PRE-OPERATIVE DIAGNOSIS:  Adjacent segment disease with spinal stenosis  POST-OPERATIVE DIAGNOSIS:  Adjacent segment disease with spinal stenosis  PROCEDURE:  Procedure(s): LUMBAR THREE-FOUR POSTERIOR LUMBAR INTERBODY FUSION WITH EXTENSION OF FUSION (N/A)  SURGEON:  Surgeon(s) and Role:    * [name], MD - Primary  PHYSICIAN ASSISTANT: Ferne Reus, PA-C  ANESTHESIA:   general  EBL:  200 mL   BLOOD ADMINISTERED:none  DRAINS: none   LOCAL MEDICATIONS USED:  BUPIVICAINE  and LIDOCAINE   SPECIMEN:  No Specimen  DISPOSITION OF SPECIMEN:  N/A  COUNTS:  YES  TOURNIQUET:  * No tourniquets in log *  DICTATION: .Dragon Dictation and Note written in EPIC  PLAN OF CARE: Admit to inpatient   PATIENT DISPOSITION:  PACU - hemodynamically stable.   Delay start of Pharmacological VTE agent (>24hrs) due to surgical blood loss or risk of bleeding: yes

## 2018-06-30 LAB — BASIC METABOLIC PANEL
Anion gap: 12 (ref 5–15)
BUN: 24 mg/dL — ABNORMAL HIGH (ref 8–23)
CO2: 20 mmol/L — ABNORMAL LOW (ref 22–32)
Calcium: 9.7 mg/dL (ref 8.9–10.3)
Chloride: 108 mmol/L (ref 98–111)
Creatinine, Ser: 1.42 mg/dL — ABNORMAL HIGH (ref 0.44–1.00)
GFR calc Af Amer: 43 mL/min — ABNORMAL LOW (ref >60)
GFR calc non Af Amer: 37 mL/min — ABNORMAL LOW (ref >60)
Glucose, Bld: 151 mg/dL — ABNORMAL HIGH (ref 70–99)
Potassium: 5.1 mmol/L (ref 3.5–5.1)
Sodium: 140 mmol/L (ref 135–145)

## 2018-06-30 LAB — APTT: aPTT: 25 seconds (ref 24–36)

## 2018-06-30 LAB — CBC
HCT: 40.1 % (ref 36.0–46.0)
Hemoglobin: 12.6 g/dL (ref 12.0–15.0)
MCH: 31.5 pg (ref 26.0–34.0)
MCHC: 31.4 g/dL (ref 30.0–36.0)
MCV: 100.3 fL — ABNORMAL HIGH (ref 80.0–100.0)
Platelets: 184 10*3/uL (ref 150–400)
RBC: 4 MIL/uL (ref 3.87–5.11)
RDW: 13.2 % (ref 11.5–15.5)
WBC: 18.8 10*3/uL — ABNORMAL HIGH (ref 4.0–10.5)
nRBC: 0 % (ref 0.0–0.2)

## 2018-06-30 LAB — PROTIME-INR
INR: 1 (ref 0.8–1.2)
Prothrombin Time: 13.1 seconds (ref 11.4–15.2)

## 2018-06-30 MED ORDER — HYDROXYZINE HCL 50 MG/ML IM SOLN
50.0000 mg | Freq: Four times a day (QID) | INTRAMUSCULAR | Status: DC | PRN
  Administered 2018-06-30: 50 mg via INTRAMUSCULAR
  Filled 2018-06-30: qty 1

## 2018-06-30 MED ORDER — OXYCODONE HCL 5 MG PO TABS: 5.0000 mg | ORAL_TABLET | ORAL | 0 refills | Status: DC | PRN

## 2018-06-30 NOTE — Progress Notes (Signed)
Orthopedic Tech Progress Note Patient Details:  Brandi Stephens 1945-07-27 [id number] Patient has brace. Saw receipt for brace by [facility] in office. Patient ID: Brandi Stephens, female   DOB: 14-Dec-1945, 73 y.o.   MRN: [id number]   Brandi Stephens 06/30/2018, 7:02 AM

## 2018-06-30 NOTE — Evaluation (Signed)
Occupational Therapy Evaluation Patient Details Name: Brandi Stephens MRN: [id number] DOB: 1946/04/14 Today's Date: 06/30/2018    History of Present Illness Pt is a 73yo female presentig with adjacent segment disease with spinal stenosis who underwent PLIFx1 at level 3-4. PYP:PJKD, anxiety disorder, gout, emphysema, SOB, benign essential tremor: PSH: previous back surgery, R knee scope, breast surgery for excision of tumor.   Clinical Impression   Patient is s/p PLIFx1 at level 3-4 surgery resulting in the deficits listed below (see OT Problem List). The patient was independent prior to sx in ADLS. Patient requires cues to pace self to use back precautions. Patient was able to voice 2/3 precautions and able to don/doff brace. Patient was recommended to use AE (reacher, sock aid, long handle sponge) when completing ADLS as will attempt to bend when completing BLE self care. Patient will have husband at home to assist as needed 24/7 when returning home to assist. The patient does not need any further skilled acute Occupational Therapy services.        Follow Up Recommendations  No OT follow up;Supervision/Assistance - 24 hour    Equipment Recommendations  None recommended by OT    Recommendations for Other Services       Precautions / Restrictions Precautions Precautions: Back Precaution Booklet Issued: Yes (comment) Precaution Comments: pt able to voice 2/3 precautions  Required Braces or Orthoses: Spinal Brace Spinal Brace: Lumbar corset;Applied in sitting position Restrictions Weight Bearing Restrictions: No      Mobility Bed Mobility Overal bed mobility: Modified Independent             General bed mobility comments: presented in chair  Transfers Overall transfer level: Modified independent Equipment used: None Transfers: Sit to/from Stand Sit to Stand: Modified independent (Device/Increase time)         General transfer comment: pt with good technique,  minimal trunk flexion    Balance Overall balance assessment: Mild deficits observed, not formally tested                                         ADL either performed or assessed with clinical judgement   ADL Overall ADL's : Needs assistance/impaired Eating/Feeding: Independent;Sitting   Grooming: Wash/dry hands;Supervision/safety;Cueing for safety;Cueing for compensatory techniques   Upper Body Bathing: Modified independent;Sitting   Lower Body Bathing: Minimal assistance;With adaptive equipment;Cueing for safety;Cueing for sequencing;Cueing for back precautions   Upper Body Dressing : Modified independent;Sitting   Lower Body Dressing: Minimal assistance;Cueing for compensatory techniques;Cueing for sequencing;Cueing for back precautions;Sit to/from stand   Toilet Transfer: Supervision/safety;Cueing for Licensed conveyancer and Hygiene: Supervision/safety;Cueing for safety;Cueing for sequencing;Cueing for compensatory techniques;Cueing for back precautions;Sit to/from stand   Tub/ Banker: Supervision/safety;Cueing for safety;Cueing for sequencing   Functional mobility during ADLs: Min guard;Rolling walker       Vision Baseline Vision/History: No visual deficits Patient Visual Report: No change from baseline Vision Assessment?: No apparent visual deficits     Perception Perception Perception Tested?: No   Praxis Praxis Praxis tested?: Not tested    Pertinent Vitals/Pain Pain Assessment: Faces Pain Score: 6  Faces Pain Scale: Hurts little more Pain Location: surgical site Pain Descriptors / Indicators: Sore Pain Intervention(s): Limited activity within patient's tolerance;Repositioned     Hand Dominance Right   Extremity/Trunk Assessment Upper Extremity Assessment Upper Extremity Assessment: Overall WFL for tasks assessed   Lower  Extremity Assessment Lower Extremity Assessment: Defer to PT  evaluation   Cervical / Trunk Assessment Cervical / Trunk Assessment: Other exceptions Cervical / Trunk Exceptions: recent surgery   Communication Communication Communication: No difficulties   Cognition Arousal/Alertness: Awake/alert Behavior During Therapy: Restless Overall Cognitive Status: Within Functional Limits for tasks assessed                                 General Comments: requires cues to pace self    General Comments  incision intact    Exercises     Shoulder Instructions      Home Living Family/patient expects to be discharged to:: Private residence Living Arrangements: Spouse/significant other Available Help at Discharge: Family;Available 24 hours/day Type of Home: House Home Access: Stairs to enter [employer] of Steps: 2 Entrance Stairs-Rails: None Home Layout: Two level Alternate Level Stairs-Number of Steps: flight Alternate Level Stairs-Rails: Can reach both Bathroom Shower/Tub: Teacher, early years/pre: Handicapped height Bathroom Accessibility: Yes How Accessible: Accessible via walker Home Equipment: [place] - 2 wheels;Cane - single point;Tub bench;Grab bars - tub/shower;Walker - 4 wheels;Adaptive equipment Adaptive Equipment: Reacher;Sock aid;Long-handled shoe horn;Long-handled sponge Additional Comments: Pt reports she doesn't used AD right now, spouse is retired and can be [employer].      Prior Functioning/Environment Level of Independence: Independent        Comments: limited due to pain prior        OT Problem List: Pain;Decreased safety awareness;Decreased knowledge of use of DME or AE      OT Treatment/Interventions:      OT Goals(Current goals can be found in the care plan section) Acute Rehab OT Goals Patient Stated Goal: home today OT Goal Formulation: With patient Time For Goal Achievement: 06/30/18 Potential to Achieve Goals: Good  OT Frequency:     Barriers to D/C:             Co-evaluation              AM-PAC OT "6 Clicks" Daily Activity     Outcome Measure Help from another person eating meals?: None Help from another person taking care of personal grooming?: None Help from another person toileting, which includes using toliet, bedpan, or urinal?: None Help from another person bathing (including washing, rinsing, drying)?: A Little Help from another person to put on and taking off regular upper body clothing?: None Help from another person to put on and taking off regular lower body clothing?: A Little 6 Click Score: 22   End of Session Equipment Utilized During Treatment: Gait belt;Back brace  Activity Tolerance: Patient tolerated treatment well Patient left: in chair;with call bell/phone within reach  OT Visit Diagnosis: Unsteadiness on feet (R26.81);Pain Pain - part of body: (back)                Time: 2244-9753 OT Time Calculation (min): 27 min Charges:  OT General Charges $OT Visit: 1 Visit OT Evaluation $OT Eval Low Complexity: 1 Low OT Treatments $Self Care/Home Management : 8-22 mins  Joeseph Amor OTR/L  Acute Rehab Services  [phone] office number [phone] pager number   Joeseph Amor 06/30/2018, 9:02 AM

## 2018-06-30 NOTE — Progress Notes (Signed)
Patient is discharged from room 3C07 at this time. Alert and in stable condition. IV site d/c'd and instructions read to patient and daughter with understanding verbalized. Left unit via wheelchair with all belongings at side.

## 2018-06-30 NOTE — Discharge Summary (Signed)
Physician Discharge Summary  Patient ID: Brandi Stephens MRN: [id number] DOB/AGE: 72-Aug-1947 72 y.o.  Admit date: 06/29/2018 Discharge date: 06/30/2018  Admission Diagnoses:  Adjacent segment disease with spinal stenosis  Discharge Diagnoses: Adjacent segment disease with spinal stenosis  Active Problems:   Lumbar spinal stenosis   Discharged Condition: good  Hospital Course: Patient was admitted by Dr. Kathyrn Sheriff who performed at L3-4 arthrodesis.  Patient is doing well following surgery.  She is up and ambulating actively.  She is voiding well.  Her dressing is clean and dry.  We are discharging her to home with instructions regarding wound care and activities.  She is to call the office on Monday to set up a follow-up appointment with Dr. Kathyrn Sheriff.  Discharge Exam: Blood pressure 125/77, pulse 65, temperature 98.4 F (36.9 C), temperature source Oral, resp. rate 16, height 5\' 7"  (1.702 m), weight 109.7 kg, SpO2 95 %.  Disposition: Discharge disposition: 01-Home or Self Care       Discharge Instructions    Discharge wound care:   Complete by:  As directed    Remove the dressing on March 16 and leave the wound open to air. Shower daily with the wound uncovered (once the dressing is been removed). Water and soapy water should run over the incision area. Do not wash directly on the incision for 2 weeks. Remove the glue after 2 weeks.   Driving Restrictions   Complete by:  As directed    No driving for 2 weeks. May ride in the car locally now. May begin to drive locally in 2 weeks.   Other Restrictions   Complete by:  As directed    Walk gradually increasing distances out in the fresh air at least twice a day. Walking additional 6 times inside the house, gradually increasing distances, daily. No bending, lifting, or twisting. Perform activities between shoulder and waist height (that is at counter height when standing or table height when sitting).     Allergies as of 06/30/2018       Reactions   Ampicillin Hives, Other (See Comments)   Severe reaction in February 2017 1.5 month to have hives to go away   Penicillins Other (See Comments)   Severe reaction in February 2017 1.5 month to have hives to go away Has patient had a PCN reaction causing immediate rash, facial/tongue/throat swelling, SOB or lightheadedness with hypotension: #  #  #  YES  #  #  #  Has patient had a PCN reaction causing severe rash involving mucus membranes or skin necrosis: No Has patient had a PCN reaction that required hospitalization No Has patient had a PCN reaction occurring within the last 10 years: No   Morphine And Related Itching   Patient states she is not allergic to morphine   Other Itching   UNSPECIFIED Analgesics   Codeine Nausea And Vomiting   Hydrocodone-acetaminophen Itching   Hydromorphone Itching   Pt has taken w/o issues      Medication List    TAKE these medications   albuterol 108 (90 Base) MCG/ACT inhaler Commonly known as:  PROVENTIL HFA;VENTOLIN HFA INHALE 2 PUFFS BY MOUTH EVERY 4 TO 6 HOURS AS NEEDED   allopurinol 100 MG tablet Commonly known as:  ZYLOPRIM TAKE 2 TABLETS(200 MG) BY MOUTH DAILY   anastrozole 1 MG tablet Commonly known as:  ARIMIDEX Take 1 tablet (1 mg total) by mouth daily.   atorvastatin 20 MG tablet Commonly known as:  LIPITOR TAKE 1 TABLET  BY MOUTH EVERY DAY   baclofen 10 MG tablet Commonly known as:  LIORESAL Take 1 tablet (10 mg total) by mouth 3 (three) times daily as needed. What changed:  when to take this   Biotin 5000 5 MG Caps Generic drug:  Biotin Take 5 mg by mouth daily.   busPIRone 15 MG tablet Commonly known as:  BUSPAR Take 15 mg by mouth 3 (three) times daily.   CALCIUM 500/D PO Take 2 each by mouth daily.   colchicine 0.6 MG tablet Take 2 tablets now and 1 tablet an hour later for flare up What changed:    how much to take  how to take this  when to take this  reasons to take this  additional  instructions   eszopiclone 3 MG Tabs Generic drug:  Eszopiclone Take 3 mg by mouth at bedtime. Take immediately before bedtime   fluticasone furoate-vilanterol 100-25 MCG/INH Aepb Commonly known as:  Breo Ellipta Inhale 1 puff into the lungs daily.   Fluticasone-Umeclidin-Vilant 100-62.5-25 MCG/INH Aepb Commonly known as:  [name] Ellipta Inhale 1 puff into the lungs daily.   furosemide 20 MG tablet Commonly known as:  LASIX Take 1 tablet (20 mg total) by mouth daily as needed. Reported on 10/28/2015 What changed:    reasons to take this  additional instructions   hydrOXYzine 25 MG capsule Commonly known as:  VISTARIL Take 25 mg by mouth 2 (two) times daily as needed for anxiety.   ibuprofen 200 MG tablet Commonly known as:  ADVIL,MOTRIN Take 400-600 mg by mouth every 6 (six) hours as needed for headache or moderate pain.   levothyroxine 75 MCG tablet Commonly known as:  SYNTHROID, LEVOTHROID TAKE 1 TABLET BY MOUTH EVERY DAY What changed:  when to take this   Linzess 145 MCG Caps capsule Generic drug:  linaclotide TAKE 1 CAPSULE BY MOUTH EVERY DAY BEFORE BREAKFAST What changed:  See the new instructions.   Magnesium 500 MG Caps Take 500 mg by mouth daily.   Melatonin 3 MG Caps Take 9 mg by mouth at bedtime.   metoprolol succinate 25 MG 24 hr tablet Commonly known as:  TOPROL-XL TAKE 1/2 TABLET(12.5 MG) BY MOUTH DAILY   MiraLax powder Generic drug:  polyethylene glycol powder Take 17 g by mouth daily as needed for moderate constipation.   olmesartan-hydrochlorothiazide 40-12.5 MG tablet Commonly known as:  BENICAR HCT Take 1 tablet by mouth daily.   oxyCODONE 5 MG immediate release tablet Commonly known as:  Oxy IR/ROXICODONE Take 1 tablet (5 mg total) by mouth every 4 (four) hours as needed (pain).   potassium chloride 10 MEQ tablet Commonly known as:  Klor-Con 10 Take 1 tablet (10 mEq total) by mouth as needed. Reported on 10/28/2015 What changed:     when to take this  reasons to take this  additional instructions   predniSONE 10 MG tablet Commonly known as:  DELTASONE Take 1 tablet (10 mg total) by mouth 2 (two) times daily with a meal.   primidone 50 MG tablet Commonly known as:  MYSOLINE TAKE 1 TABLET(50 MG) BY MOUTH AT BEDTIME What changed:  See the new instructions.   rOPINIRole 0.5 MG tablet Commonly known as:  REQUIP Take 1.5 tablets (0.75 mg total) by mouth at bedtime.   traMADol 50 MG tablet Commonly known as:  ULTRAM Take 1 tablet (50 mg total) by mouth every 6 (six) hours as needed for moderate pain.   vitamin B-12 1000 MCG tablet Commonly known  as:  CYANOCOBALAMIN Take 1,000 mcg by mouth daily.            Discharge Care Instructions  (From admission, onward)         Start     Ordered   06/30/18 0000  Discharge wound care:    Comments:  Remove the dressing on March 16 and leave the wound open to air. Shower daily with the wound uncovered (once the dressing is been removed). Water and soapy water should run over the incision area. Do not wash directly on the incision for 2 weeks. Remove the glue after 2 weeks.   06/30/18 0855           Signed: Hosie Spangle 06/30/2018, 8:55 AM

## 2018-06-30 NOTE — Discharge Instructions (Signed)
Wound Care Keep incision covered and dry for two days.   Do not put any creams, lotions, or ointments on incision. Leave steri-strips on back.  They will fall off by themselves. Activity Walk each and every day, increasing distance each day. No lifting greater than 5 lbs.  Avoid bending, lifting and twisting. No driving for 2 weeks; may ride as a passenger locally. If provided with back brace, wear when out of bed.  It is not necessary to wear brace in bed. Diet Resume your normal diet.  Return to Work Will be discussed at you follow up appointment. Call Your Doctor If Any of These Occur Redness, drainage, or swelling at the wound.  Temperature greater than 101 degrees. Severe pain not relieved by pain medication. Incision starts to come apart. Follow Up Appt Call today for appointment in 1-2 weeks ([phone]) or for problems.  If you have any hardware placed in your spine, you will need an x-ray before your appointment.  

## 2018-06-30 NOTE — Anesthesia Postprocedure Evaluation (Signed)
Anesthesia Post Note  Patient: Brandi Stephens  Procedure(s) Performed: LUMBAR THREE-FOUR POSTERIOR LUMBAR INTERBODY FUSION WITH EXTENSION OF FUSION (N/A Spine Lumbar)     Patient location during evaluation: PACU Anesthesia Type: General Level of consciousness: awake and alert Pain management: pain level controlled Vital Signs Assessment: post-procedure vital signs reviewed and stable Respiratory status: spontaneous breathing, nonlabored ventilation, respiratory function stable and patient connected to nasal cannula oxygen Cardiovascular status: blood pressure returned to baseline and stable Postop Assessment: no apparent nausea or vomiting Anesthetic complications: no    Last Vitals:  Vitals:   06/30/18 0411 06/30/18 0722  BP: 125/83 125/77  Pulse: 78 65  Resp: 18 16  Temp: 36.7 C 36.9 C  SpO2: 94% 95%    Last Pain:  Vitals:   06/30/18 0943  TempSrc:   PainSc: 7                  Tiajuana Amass

## 2018-06-30 NOTE — Evaluation (Signed)
Physical Therapy Evaluation Patient Details Name: Brandi Stephens MRN: [id number] DOB: Sep 07, 1945 Today's Date: 06/30/2018   History of Present Illness  Pt is a 73yo female presentig with adjacent segment disease with spinal stenosis who underwent PLIFx1 at level 3-4. EUM:PNTI, anxiety disorder, gout, emphysema, SOB, benign essential tremor: PSH: previous back surgery, R knee scope, breast surgery for excision of tumor.  Clinical Impression  Patient is s/p above surgery resulting in the deficits listed below (see PT Problem List). Pt mobilizing well with minimal pain. Encouraged pt to use RW to assist with energy conservation and increased safety due to patient furniture walking currently. Patient will benefit from skilled PT to increase their independence and safety with mobility (while adhering to their precautions) to allow discharge to the venue listed below.     Follow Up Recommendations No PT follow up;Supervision - Intermittent    Equipment Recommendations  None recommended by PT    Recommendations for Other Services       Precautions / Restrictions Precautions Precautions: Back Precaution Booklet Issued: Yes (comment) Precaution Comments: pt with verbal understanding but required cues to adhere functionally Required Braces or Orthoses: Spinal Brace Spinal Brace: Lumbar corset;Applied in sitting position Restrictions Weight Bearing Restrictions: No      Mobility  Bed Mobility Overal bed mobility: Modified Independent             General bed mobility comments: HOB elevated, has adjustable bed at home, verbal cues for log roll technique but no physical assist needed  Transfers Overall transfer level: Needs assistance Equipment used: None Transfers: Sit to/from Stand Sit to Stand: Min guard         General transfer comment: pt with good technique, minimal trunk flexion  Ambulation/Gait Ambulation/Gait assistance: Min guard Gait Distance (Feet): 200  Feet Assistive device: None Gait Pattern/deviations: Step-through pattern;Decreased stride length;Wide base of support Gait velocity: wfl for just having surgery   General Gait Details: pt touching hallway rail or wall, SPO2 dec to 80% on RA. Discussed using RW for energy conservation for longer distance ambulation and to increase safety as pt is practicing "furniture walking" Pt SpO2 returned to 90s s/p 1 min rest in chair  Stairs Stairs: Yes Stairs assistance: Min guard Stair Management: One rail Right;Alternating pattern;Forwards Number of Stairs: 12 General stair comments: pt with no difficulty  Wheelchair Mobility    Modified Rankin (Stroke Patients Only)       Balance Overall balance assessment: Mild deficits observed, not formally tested                                           Pertinent Vitals/Pain Pain Assessment: 0-10 Pain Score: 6  Pain Location: surgical site Pain Descriptors / Indicators: Sore Pain Intervention(s): Monitored during session    Home Living Family/patient expects to be discharged to:: Private residence Living Arrangements: Spouse/significant other Available Help at Discharge: Family;Available 24 hours/day Type of Home: House Home Access: Stairs to enter Entrance Stairs-Rails: None Entrance Stairs-Number of Steps: 2 Home Layout: Two level Home Equipment: Walker - 2 wheels;Cane - single point;Tub bench;Grab bars - tub/shower;Walker - 4 wheels Additional Comments: Pt reports she doesn't used AD right now, spouse is retired and can be [employer].    Prior Function Level of Independence: Independent         Comments: pt reports "i typically furniture walk"  Hand Dominance   Dominant Hand: Right    Extremity/Trunk Assessment   Upper Extremity Assessment Upper Extremity Assessment: Overall WFL for tasks assessed    Lower Extremity Assessment Lower Extremity Assessment: Generalized weakness    Cervical /  Trunk Assessment Cervical / Trunk Assessment: Other exceptions Cervical / Trunk Exceptions: recent surgery  Communication   Communication: No difficulties  Cognition Arousal/Alertness: Awake/alert Behavior During Therapy: WFL for tasks assessed/performed(mildly anxious but able to participate) Overall Cognitive Status: Within Functional Limits for tasks assessed                                        General Comments General comments (skin integrity, edema, etc.): incision intact    Exercises     Assessment/Plan    PT Assessment Patient needs continued PT services  PT Problem List Decreased strength;Decreased range of motion;Decreased activity tolerance;Decreased balance;Decreased mobility;Decreased knowledge of use of DME       PT Treatment Interventions DME instruction;Gait training;Stair training;Functional mobility training;Therapeutic activities;Therapeutic exercise;Balance training;Neuromuscular re-education    PT Goals (Current goals can be found in the Care Plan section)  Acute Rehab PT Goals Patient Stated Goal: home today PT Goal Formulation: With patient Time For Goal Achievement: 07/14/18 Potential to Achieve Goals: Good    Frequency Min 5X/week   Barriers to discharge        Co-evaluation               AM-PAC PT "6 Clicks" Mobility  Outcome Measure Help needed turning from your back to your side while in a flat bed without using bedrails?: None Help needed moving from lying on your back to sitting on the side of a flat bed without using bedrails?: None Help needed moving to and from a bed to a chair (including a wheelchair)?: None Help needed standing up from a chair using your arms (e.g., wheelchair or bedside chair)?: None Help needed to walk in hospital room?: A Little Help needed climbing 3-5 steps with a railing? : A Little 6 Click Score: 22    End of Session Equipment Utilized During Treatment: Back brace Activity  Tolerance: Patient tolerated treatment well Patient left: in chair;with call bell/phone within reach Nurse Communication: Mobility status(SpO2 dec to 80% on RA during amb) PT Visit Diagnosis: Unsteadiness on feet (R26.81)    Time: 3845-3646 PT Time Calculation (min) (ACUTE ONLY): 25 min   Charges:   PT Evaluation $PT Eval Moderate Complexity: 1 Mod PT Treatments $Gait Training: 8-22 mins        Kittie Plater, PT, DPT Acute Rehabilitation Services Pager #: [phone] Office #: [phone]   [place] 06/30/2018, 8:02 AM

## 2018-07-02 NOTE — Op Note (Signed)
NEUROSURGERY OPERATIVE NOTE   PREOP DIAGNOSIS:  1. Lumbago 2. Lumbar stenosis with claudication, L3-4 3. Adjacent level disease, L3-4  POSTOP DIAGNOSIS: Same  PROCEDURE: 1. L3 laminectomy with facetectomy for decompression of exiting nerve roots, more than would be required for placement of interbody graft 2. Placement of anterior interbody device - Medtronic 22mm expandable cage x1 3. Removal of previous rods 3. Posterior segmental instrumentation using cortical pedicle screws at L3 - S1 4. Interbody arthrodesis, L3-4 5. Use of locally harvested bone autograft 6. Use of non-structural bone allograft - BMP  SURGEON: Dr. [name], MD  ASSISTANT: Ferne Reus, PA-C  ANESTHESIA: General Endotracheal  EBL: 200cc  SPECIMENS: None  DRAINS: None  COMPLICATIONS: None immediate  CONDITION: Hemodynamically stable to PACU  HISTORY: Brandi Stephens is a 73 y.o. female who has been followed in the outpatient clinic with back and leg pain who previously underwent L4-5 L5-S1 decompression and fusion about 3 years ago.  She has had recurrence of her pain, with imaging demonstrating adjacent level disease including stenosis at L3-4.  She attempted multiple conservative treatments and ultimately elected to proceed with surgical decompression and fusion. Risks and benefits were reviewed and consent was obtained.  PROCEDURE IN DETAIL: After informed consent was obtained and witnessed, the patient was brought to the operating room. After induction of general anesthesia, the patient was positioned on the operative table in the prone position. All pressure points were meticulously padded.  The previous incision was then marked out and prepped and draped in the usual sterile fashion.  After timeout was conducted, skin was infiltrated with local anesthetic. Skin incision was then made sharply and extended superiorly and Bovie electrocautery was used to dissect the subcutaneous tissue  until the lumbodorsal fascia was identified and incised.  The superior screws at L4 were then identified, as was the lamina at L3.  Subperiosteal dissection was carried out and self-retaining retractor was placed.  At this point attention was turned to decompression. Complete L3 laminectomy with facetectomy was completed using a combination of Kerrison rongeurs and a high-speed drill.  The ligamentum flavum as well as the medial portion of the facet and underlying ligament appeared to be significantly hypertrophic causing a fair bit of stenosis.  After complete removal of the ligamentum flavum, the thecal sac was well decompressed, and I was easily able to pass a ball-tipped dissector within the ventral epidural space and out the L3-4 foramen bilaterally.  Disc space was then identified, incised, and using a combination of shavers, curettes and rongeurs, complete discectomy was completed. Endplates were prepared, and bone harvested during decompression was mixed with BMP and packed into the interspace.  Initially, 8 mm cage was inserted on the patient's right side.  Cage was noted to be essentially in the midline directly anterior to the thecal sac, with the anterior portion of the cage at the anterior margin of the L3-4 interspace.  I elected to keep the single interbody device rather than trying to remove the cage and place it more laterally in order to place bilateral cages.  At this point, the L3 cortical pedicle screw entry points were identified using standard anatomic landmarks and lateral fluoroscopy.  Pilot holes were drilled and tapped to 5.5 mm. Screws were then placed in L3.  I then dissected out the remainder of the previous construct inferiorly, including the L4, L5, and S1 screw tulips, and the intervening rod.  Setscrews were removed and the tulips were cleaned of any overlying soft  tissue.  Previous rod was then removed.  A longer pre-bent lordotic rod was then placed connecting to the newly  placed L3 pedicle screws.  Setscrews were placed with the assistance of persuader device.  Setscrews were then final tightened.  Final AP and lateral fluoroscopic images confirmed good position.  The wound was then irrigated with copious amounts of antibiotic saline, then closed in standard fashion using a combination of interrupted 0 and 3-0 Vicryl stitches in the muscular, fascial, and subcutaneous layers. Skin was then closed using standard Dermabond. Sterile dressing was then applied. The patient was then transferred to the stretcher, extubated, and taken to the postanesthesia care unit in stable hemodynamic condition.  At the end of the case all sponge, needle, cottonoid, and instrument counts were correct.

## 2018-07-09 ENCOUNTER — Encounter (HOSPITAL_COMMUNITY): Payer: Self-pay | Admitting: Neurosurgery

## 2018-07-14 HISTORY — PX: MOUTH SURGERY: SHX715

## 2018-07-25 ENCOUNTER — Other Ambulatory Visit: Payer: Self-pay | Admitting: Oncology

## 2018-07-25 ENCOUNTER — Other Ambulatory Visit: Payer: Self-pay | Admitting: Family Medicine

## 2018-07-25 NOTE — Telephone Encounter (Signed)
Is this okay to refill? 

## 2018-08-07 ENCOUNTER — Ambulatory Visit: Payer: PPO | Admitting: Adult Health

## 2018-08-15 ENCOUNTER — Other Ambulatory Visit: Payer: Self-pay | Admitting: Family Medicine

## 2018-08-15 NOTE — Telephone Encounter (Signed)
Please contact her and find out which problem she is taking this for. If her orthopedist is having her use this for muscle spasms then I would prefer they refill it. Let me know please.

## 2018-08-15 NOTE — Telephone Encounter (Signed)
Is this okay to refill? This was sent twice

## 2018-08-16 NOTE — Telephone Encounter (Signed)
Pt states she had this for her rotator cuff surgery along time ago and then was using med when she has spasms near surgery area. Last refill was 2 years ago by you and she does not use this very often and does not see ortho anymore for rotator cuff. Sometimes she has spasms in her back. Her last back surgery was in march. Please advise

## 2018-08-30 ENCOUNTER — Other Ambulatory Visit: Payer: Self-pay | Admitting: Family Medicine

## 2018-08-30 ENCOUNTER — Telehealth: Payer: Self-pay | Admitting: Internal Medicine

## 2018-08-30 MED ORDER — PROMETHAZINE HCL 12.5 MG PO TABS: 12.5000 mg | ORAL_TABLET | Freq: Three times a day (TID) | ORAL | 0 refills | Status: DC | PRN

## 2018-08-30 NOTE — Telephone Encounter (Signed)
Please let her know that I sent in promethazine for her nausea since Zofran is not helping. This medication may cause sedation so do not take this with other sedating medications. Do not drive after taking this. If she continues having symptoms or if she gets worse, I recommend she go to the [facility] ED.

## 2018-08-30 NOTE — Telephone Encounter (Signed)
Pt states her daughter gave her some of zofran and it makes her sicker and would like to know if there is something else.

## 2018-08-30 NOTE — Telephone Encounter (Signed)
Ok to send in zofran ODT 4 mg for nausea. How long has she been having symptoms? She can treat her symptoms with Tylenol and hydration and if they are not improving she may want to get a test to rule out Covid-19. This can be done through [facility] Please give her the number in case she decides to go. If she gets worse or is having vomiting or not able to stay hydrated then she may need to go to the [facility] hospital for evaluation. Hopefully this is just a brief illness that will pass. Keep me posted.

## 2018-08-30 NOTE — Telephone Encounter (Signed)
Pt called and left a voicemail stating that she had back surgery 2 months ago and have not been out of her house but she has been having some body aches, chills, low grade fever, nausea, diarrhea one minute and then conspitation the next minute. Pt does not think she has covid as she hasn't been out of house. She would like to know what she needs to do? Wants something for nausea

## 2018-08-31 NOTE — Telephone Encounter (Signed)
Pt was notified. And feeling better since taking one dose so far

## 2018-09-17 DIAGNOSIS — F4321 Adjustment disorder with depressed mood: Secondary | ICD-10-CM | POA: Diagnosis not present

## 2018-09-19 DIAGNOSIS — M4800 Spinal stenosis, site unspecified: Secondary | ICD-10-CM | POA: Diagnosis not present

## 2018-10-23 ENCOUNTER — Ambulatory Visit: Payer: PPO | Admitting: Adult Health

## 2018-10-23 ENCOUNTER — Other Ambulatory Visit: Payer: Self-pay | Admitting: Oncology

## 2018-10-23 ENCOUNTER — Other Ambulatory Visit: Payer: Self-pay | Admitting: Family Medicine

## 2018-10-23 NOTE — Telephone Encounter (Signed)
Is this okay to refill? awv due mid august

## 2018-10-23 NOTE — Telephone Encounter (Signed)
Please give her 90 days and then she will have been in for her AWV

## 2018-10-31 ENCOUNTER — Other Ambulatory Visit: Payer: Self-pay | Admitting: Physician Assistant

## 2018-10-31 DIAGNOSIS — M7989 Other specified soft tissue disorders: Secondary | ICD-10-CM

## 2018-10-31 DIAGNOSIS — Z6837 Body mass index (BMI) 37.0-37.9, adult: Secondary | ICD-10-CM | POA: Diagnosis not present

## 2018-10-31 DIAGNOSIS — I10 Essential (primary) hypertension: Secondary | ICD-10-CM | POA: Diagnosis not present

## 2018-11-01 ENCOUNTER — Other Ambulatory Visit: Payer: Self-pay

## 2018-11-01 ENCOUNTER — Ambulatory Visit
Admission: RE | Admit: 2018-11-01 | Discharge: 2018-11-01 | Disposition: A | Discharge status: Home / Self Care | Payer: PPO | Source: Ambulatory Visit | Attending: Physician Assistant | Admitting: Physician Assistant

## 2018-11-01 DIAGNOSIS — Z86718 Personal history of other venous thrombosis and embolism: Secondary | ICD-10-CM | POA: Diagnosis not present

## 2018-11-01 DIAGNOSIS — M7989 Other specified soft tissue disorders: Secondary | ICD-10-CM

## 2018-11-02 ENCOUNTER — Encounter: Payer: Self-pay | Admitting: Adult Health

## 2018-11-02 ENCOUNTER — Ambulatory Visit (INDEPENDENT_AMBULATORY_CARE_PROVIDER_SITE_OTHER): Payer: PPO | Admitting: Adult Health

## 2018-11-02 DIAGNOSIS — J439 Emphysema, unspecified: Secondary | ICD-10-CM | POA: Diagnosis not present

## 2018-11-02 NOTE — Progress Notes (Signed)
@Patient  ID: Brandi Stephens, female    DOB: 10-Sep-1945, 73 y.o.   MRN: [id number]  Chief Complaint  Patient presents with  . Follow-up    Emphysema     Referring provider: Girtha Rm, NP-C  HPI: 73 year old female former smoker, quit 05/22/2018, seen for pulmonary consult May 17, 2018 for slow to resolve pneumonia and cough Breast cancer history   TEST/EVENTS :  CT chest was done on June 01, 2018 that showed no acute process.  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  Mild emphysema  Patient underwent pulmonary function testing that showed normal lung function with FEV1 at 85%, ratio 73, 88% FVC, no significant bronchodilator response.,  Mid flow reversibility.  DLCO was 74%  11/02/2018 Follow up : Emphysema  Patient presents for a 70-month follow-up.  Patient was seen January 2020 for a pulmonary consult for cough and shortness of breath after recent slow to resolve pneumonia.  She had been told that she had underlying COPD and was started on [name].  She was encouraged on smoking cessation and was successful with quitting smoking on May 22, 2018.  Pulmonary function testing showed no significant COPD with no airflow obstruction or restriction.  FEV1 was 85%, ratio 73, FVC 88% no significant bronchodilator response he did have some mild mid flow reversibility.  And DLCO was slightly decreased at 74%.  CT chest done on June 01, 2018 showed no acute pulmonary process. Patient was changed from [name] to [facility] last visit.  Patient says she took this briefly but does not feel that she needs any inhaler that her shortness of breath has improved significantly since earlier this year.  Also cough has resolved.  She says she feels much better since quitting smoking.  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  She is had multiple back surgeries and recently underwent back surgery in March 2020.  Patient says she is recovering for this and feels  that she is starting to do well.  She is going to be beginning physical therapy soon.  Her activity level has also improved slowly.   Allergies  Allergen Reactions  . Ampicillin Hives and Other (See Comments)    Severe reaction in February 2017 1.5 month to have hives to go away  . Penicillins Other (See Comments)    Severe reaction in February 2017 1.5 month to have hives to go away Has patient had a PCN reaction causing immediate rash, facial/tongue/throat swelling, SOB or lightheadedness with hypotension: #  #  #  YES  #  #  #  Has patient had a PCN reaction causing severe rash involving mucus membranes or skin necrosis: No Has patient had a PCN reaction that required hospitalization No Has patient had a PCN reaction occurring within the last 10 years: No   . Morphine And Related Itching    Patient states she is not allergic to morphine  . Other Itching    UNSPECIFIED Analgesics  . Codeine Nausea And Vomiting  . Hydrocodone-Acetaminophen Itching  . Hydromorphone Itching    Pt has taken w/o issues    Immunization History  Administered Date(s) Administered  . Influenza, High Dose Seasonal PF 05/01/2017  . Influenza-Unspecified 12/17/2013, 12/12/2017  . Pneumococcal Conjugate-13 01/14/2014  . Pneumococcal Polysaccharide-23 07/06/2016  . Tdap 07/02/2013  . Zoster Recombinat (Shingrix) 12/12/2017    Past Medical History:  Diagnosis Date  . AIN (anal intraepithelial neoplasia) anal canal   . Anemia  with pregnancy  . Anxiety   . Arthritis    knees, lower back, knees  . Chronic constipation   . Coarse tremors    Essential  . COPD (chronic obstructive pulmonary disease) ([place])    2017 chest CT, history of  . Degenerative lumbar spinal stenosis   . GERD (gastroesophageal reflux disease)    pt denies  . Gout   . Grade I diastolic dysfunction 24/58/0998   Noted ECHO  . Hemorrhoids   . History of adenomatous polyp of colon   . History of basal cell carcinoma (BCC)  excision 2015   nose  . History of sepsis 05/14/2014   post lumbar surgery  . History of shingles    x2  . Hyperlipidemia   . Hypertension   . Hypothyroidism   . Irregular heart beat    history of while undergoing radiation  . Malignant neoplasm of upper-inner quadrant of left breast in female, estrogen receptor positive [facility]) oncologist-  dr Jana Hakim--  per lov in epic no recurrence   dx 07-13-2015  Left breast invasive ductal carcinoma, Stage IA, Grade 1 (TXN0),  09-16-2015  s/p  left breast lumpectomy with sln bx's,  completed radiation 11-18-2015,  started antiestrogen therapy 12-14-2015  . Neuropathy    Left foot and back of left leg  . Personal history of radiation therapy    completed 11-18-2015  left breast  . Pneumonia   . PONV (postoperative nausea and vomiting)   . Prediabetes    diet controlled, no med  . Restless leg syndrome   . Seasonal allergies   . Seizure ([place]) 05/2015   due to sepsis, only one time episode  . Wears partial dentures    lower     Tobacco History: Social History   Tobacco Use  Smoking Status Former Smoker  . Packs/day: 0.50  . Years: 35.00  . Pack years: 17.50  . Types: Cigarettes  . Quit date: 05/22/2018  . Years since quitting: 0.4  Smokeless Tobacco Never Used   Counseling given: Not Answered   Outpatient Medications Prior to Visit  Medication Sig Dispense Refill  . albuterol (PROVENTIL HFA;VENTOLIN HFA) 108 (90 Base) MCG/ACT inhaler INHALE 2 PUFFS BY MOUTH EVERY 4 TO 6 HOURS AS NEEDED 42.5 g 0  . allopurinol (ZYLOPRIM) 100 MG tablet TAKE 2 TABLETS(200 MG) BY MOUTH DAILY 180 tablet 0  . anastrozole (ARIMIDEX) 1 MG tablet TAKE 1 TABLET(1 MG) BY MOUTH DAILY 90 tablet 0  . atorvastatin (LIPITOR) 20 MG tablet TAKE 1 TABLET BY MOUTH EVERY DAY 90 tablet 0  . baclofen (LIORESAL) 10 MG tablet Take 1 tablet (10 mg) once daily as needed. 90 tablet 0  . Biotin (BIOTIN 5000) 5 MG CAPS Take 5 mg by mouth daily.     . busPIRone (BUSPAR) 15 MG  tablet Take 15 mg by mouth 3 (three) times daily.     . Calcium Carbonate-Vitamin D (CALCIUM 500/D PO) Take 2 each by mouth daily.     . colchicine 0.6 MG tablet Take 2 tablets now and 1 tablet an hour later for flare up (Patient taking differently: Take 0.6 mg by mouth daily as needed (for gout flare up). ) 6 tablet 0  . diclofenac (VOLTAREN) 75 MG EC tablet     . Eszopiclone (ESZOPICLONE) 3 MG TABS Take 3 mg by mouth at bedtime. Take immediately before bedtime    . fluticasone furoate-vilanterol (BREO ELLIPTA) 100-25 MCG/INH AEPB Inhale 1 puff into the lungs daily. 1  each 3  . furosemide (LASIX) 20 MG tablet Take 1 tablet (20 mg total) by mouth daily as needed. Reported on 10/28/2015 (Patient taking differently: Take 20 mg by mouth daily as needed for fluid. ) 90 tablet 1  . hydrOXYzine (VISTARIL) 25 MG capsule Take 25 mg by mouth 2 (two) times daily as needed for anxiety.    Marland Kitchen ibuprofen (ADVIL,MOTRIN) 200 MG tablet Take 400-600 mg by mouth every 6 (six) hours as needed for headache or moderate pain.    Marland Kitchen levothyroxine (SYNTHROID) 75 MCG tablet TAKE 1 TABLET(75 MCG) BY MOUTH DAILY BEFORE BREAKFAST 90 tablet 0  . LINZESS 145 MCG CAPS capsule TAKE 1 CAPSULE BY MOUTH EVERY DAY BEFORE BREAKFAST (Patient taking differently: Take 145 mcg by mouth daily before breakfast. ) 90 capsule 1  . Magnesium 500 MG CAPS Take 500 mg by mouth daily.    . Melatonin 3 MG CAPS Take 9 mg by mouth at bedtime.    . metoprolol succinate (TOPROL-XL) 25 MG 24 hr tablet TAKE 1/2 TABLET(12.5 MG) BY MOUTH DAILY 45 tablet 2  . olmesartan-hydrochlorothiazide (BENICAR HCT) 40-12.5 MG tablet TAKE 1 TABLET BY MOUTH DAILY 90 tablet 0  . polyethylene glycol powder (MIRALAX) powder Take 17 g by mouth daily as needed for moderate constipation.     . potassium chloride (KLOR-CON 10) 10 MEQ tablet Take 1 tablet (10 mEq total) by mouth as needed. Reported on 10/28/2015 (Patient taking differently: Take 10 mEq by mouth daily as needed (with  Lasix). ) 90 tablet 1  . predniSONE (DELTASONE) 10 MG tablet Take 1 tablet (10 mg total) by mouth 2 (two) times daily with a meal. 14 tablet 0  . primidone (MYSOLINE) 50 MG tablet TAKE 1 TABLET(50 MG) BY MOUTH AT BEDTIME 90 tablet 0  . promethazine (PHENERGAN) 12.5 MG tablet Take 1 tablet (12.5 mg total) by mouth every 8 (eight) hours as needed for nausea or vomiting. 20 tablet 0  . rOPINIRole (REQUIP) 0.5 MG tablet Take 1.5 tablets (0.75 mg total) by mouth at bedtime. 140 tablet 3  . traMADol (ULTRAM) 50 MG tablet Take 1 tablet (50 mg total) by mouth every 6 (six) hours as needed for moderate pain. 15 tablet 0  . vitamin B-12 (CYANOCOBALAMIN) 1000 MCG tablet Take 1,000 mcg by mouth daily.    Marland Kitchen oxyCODONE (OXY IR/ROXICODONE) 5 MG immediate release tablet Take 1 tablet (5 mg total) by mouth every 4 (four) hours as needed (pain). 35 tablet 0  . Fluticasone-Umeclidin-Vilant ([name] ELLIPTA) 100-62.5-25 MCG/INH AEPB Inhale 1 puff into the lungs daily. 2 each 0   No facility-administered medications prior to visit.      Review of Systems:   Constitutional:   No  weight loss, night sweats,  Fevers, chills, fatigue, or  lassitude.  HEENT:   No headaches,  Difficulty swallowing,  Tooth/dental problems, or  Sore throat,                No sneezing, itching, ear ache, nasal congestion, post nasal drip,   CV:  No chest pain,  Orthopnea, PND, swelling in lower extremities, anasarca, dizziness, palpitations, syncope.   GI  No heartburn, indigestion, abdominal pain, nausea, vomiting, diarrhea, change in bowel habits, loss of appetite, bloody stools.   Resp: No shortness of breath with exertion or at rest.  No excess mucus, no productive cough,  No non-productive cough,  No coughing up of blood.  No change in color of mucus.  No wheezing.  No chest  wall deformity  Skin: no rash or lesions.  GU: no dysuria, change in color of urine, no urgency or frequency.  No flank pain, no hematuria   MS: Positive  chronic back pain    Physical Exam  BP 124/80 (BP Location: Left Arm, Patient Position: Sitting, Cuff Size: Normal)   Pulse 76   Temp 98.3 F (36.8 C) (Oral)   Ht 5\' 8"  (1.727 m)   Wt 244 lb (110.7 kg)   SpO2 96%   BMI 37.10 kg/m   GEN: A/Ox3; pleasant , NAD, morbidly obese   HEENT:  [place]/AT,   NOSE-clear, THROAT-clear, no lesions, no postnasal drip or exudate noted.   NECK:  Supple w/ fair ROM; no JVD; normal carotid impulses w/o bruits; no thyromegaly or nodules palpated; no lymphadenopathy.    RESP  Clear  P & A; w/o, wheezes/ rales/ or rhonchi. no accessory muscle use, no dullness to percussion  CARD:  RRR, no m/r/g, trace peripheral edema, pulses intact, no cyanosis or clubbing.  GI:   Soft & nt; nml bowel sounds; no organomegaly or masses detected.   Musco: Warm bil, no deformities or joint swelling noted.   Neuro: alert, no focal deficits noted.    Skin: Warm, no lesions or rashes    Lab Results:  CBC   BMET    ProBNP No results found for: PROBNP  Imaging:    PFT Results Latest Ref Rng & Units 06/04/2018  FVC-Pre L 2.97  FVC-Predicted Pre % 88  FVC-Post L 2.96  FVC-Predicted Post % 88  Pre FEV1/FVC % % 70  Post FEV1/FCV % % 73  FEV1-Pre L 2.08  FEV1-Predicted Pre % 82  FEV1-Post L 2.16  DLCO UNC% % 74  DLCO COR %Predicted % 81  TLC L 6.06  TLC % Predicted % 110  RV % Predicted % 127    No results found for: NITRICOXIDE      Assessment & Plan:   Emphysema lung (HCC) Mild emphysema noted on CT chest Patient has no significant airflow obstruction or restriction on pulmonary function testing.  Clinically she is much improved after recovering from her pneumonia earlier this year and quitting smoking.  Activity level starting to improve since back surgery.  She is no longer using Breo.  For now can remain off of maintenance inhalers.  May use pro-air rescue inhaler.  Advised if she starts to see increased use or change in her activity  tolerance or return of shortness of breath or coughing she can call our office for a reevaluation to see if maintenance medication is indicated. Discussed a low-dose CT screening program as she has a long smoking history.  She declines at this time.  She says she is has significant radiation exposure in the past and would like to decrease this potential exposure even though this is a low-dose screening CT.  Plan  Patient Instructions  May remain off [place] .  May use ProAir Inhaler 2 puffs every 4-6 hrs as needed for wheezing /shortness  Activity as tolerated.  Follow up with Dr. Ander Slade or Tavia Stave NP in 6 months and As needed          Rexene Edison, NP 11/02/2018

## 2018-11-02 NOTE — Patient Instructions (Addendum)
May remain off BREO .  May use ProAir Inhaler 2 puffs every 4-6 hrs as needed for wheezing /shortness  Activity as tolerated.  Follow up with Dr. Ander Slade or Zhania Shaheen NP in 6 months and As needed

## 2018-11-02 NOTE — Assessment & Plan Note (Signed)
Mild emphysema noted on CT chest Patient has no significant airflow obstruction or restriction on pulmonary function testing.  Clinically she is much improved after recovering from her pneumonia earlier this year and quitting smoking.  Activity level starting to improve since back surgery.  She is no longer using Breo.  For now can remain off of maintenance inhalers.  May use pro-air rescue inhaler.  Advised if she starts to see increased use or change in her activity tolerance or return of shortness of breath or coughing she can call our office for a reevaluation to see if maintenance medication is indicated. Discussed a low-dose CT screening program as she has a long smoking history.  She declines at this time.  She says she is has significant radiation exposure in the past and would like to decrease this potential exposure even though this is a low-dose screening CT.  Plan  Patient Instructions  May remain off [place] .  May use ProAir Inhaler 2 puffs every 4-6 hrs as needed for wheezing /shortness  Activity as tolerated.  Follow up with Dr. Ander Slade or Aladdin Kollmann NP in 6 months and As needed

## 2018-11-07 ENCOUNTER — Other Ambulatory Visit: Payer: Self-pay

## 2018-11-07 ENCOUNTER — Ambulatory Visit: Payer: PPO | Admitting: Family Medicine

## 2018-11-07 ENCOUNTER — Ambulatory Visit (INDEPENDENT_AMBULATORY_CARE_PROVIDER_SITE_OTHER): Payer: PPO | Admitting: Family Medicine

## 2018-11-07 ENCOUNTER — Encounter: Payer: Self-pay | Admitting: Family Medicine

## 2018-11-07 VITALS — BP 130/80 | HR 72 | Temp 98.2°F | Wt 246.4 lb

## 2018-11-07 DIAGNOSIS — M1712 Unilateral primary osteoarthritis, left knee: Secondary | ICD-10-CM

## 2018-11-07 DIAGNOSIS — I10 Essential (primary) hypertension: Secondary | ICD-10-CM | POA: Diagnosis not present

## 2018-11-07 DIAGNOSIS — N189 Chronic kidney disease, unspecified: Secondary | ICD-10-CM

## 2018-11-07 DIAGNOSIS — R609 Edema, unspecified: Secondary | ICD-10-CM | POA: Diagnosis not present

## 2018-11-07 DIAGNOSIS — R600 Localized edema: Secondary | ICD-10-CM

## 2018-11-07 NOTE — Progress Notes (Signed)
Subjective:    Patient ID: Brandi Stephens, female    DOB: [birth year], 73 y.o.   MRN: [id number]  HPI Chief Complaint  Patient presents with   bilateral foot swelling    bilateral foot swelling, since the end of june, was negative for blood clot last week with neurosurgeon   Here with complaints of bilateral LE edema, worse on the left for the past month or so. She was evaluated by her neurosurgeon and sent for dopplers to rule out DVT. 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  Results of the dopplers were negative for DVT, new or old.  She also has noticed some fluid in her hands but this resolves with Lasix.   She does have a history of LE edema. States gabapentin in the past was the cause. Not currently on gabapentin.  She is going to see a new neurologist for neuropathy and RLS  States she has been taking Lasix and potassium for the past month  daily. She was taking this prn until 4 weeks ago when the edema became worse.   [place]. Does not move around very often. Sedentary.  Left knee pain from arthritis.   Has been using no salt. Denies any increase in sodium.   States she has been llevating her left feet and it helps but her left LE edema is never resolved completely.  Lasix helps but again does not completely take care of the edema.   She has compression stockings at home but has not worn them.   Denies fever, chills, dizziness, chest pain, palpitations, shortness of breath, orthopnea, cough, abdominal pain, N/V/D, urinary symptoms.    Review of Systems Pertinent positives and negatives in the history of present illness.     Objective:   Physical Exam Constitutional:      General: She is not in acute distress.    Appearance: She is not ill-appearing.  Eyes:     Conjunctiva/sclera: Conjunctivae normal.     Pupils: Pupils are equal, round, and reactive to light.  Neck:     Musculoskeletal: Normal range of motion and  neck supple.  Cardiovascular:     Rate and Rhythm: Normal rate and regular rhythm.     Pulses: Normal pulses.     Heart sounds: Normal heart sounds.     Comments: Calves nontender and symmetrical. Negative Homan's  Pulmonary:     Effort: Pulmonary effort is normal.     Breath sounds: Normal breath sounds.  Musculoskeletal:     Left lower leg: 1+ Edema present.  Skin:    General: Skin is warm and dry.     Capillary Refill: Capillary refill takes less than 2 seconds.     Coloration: Skin is not pale.     Findings: No erythema or rash.  Neurological:     General: No focal deficit present.     Mental Status: She is alert and oriented to person, place, and time.     Cranial Nerves: No cranial nerve deficit.     Motor: No weakness.     Gait: Gait normal.    BP 130/80    Pulse 72    Temp 98.2 F (36.8 C) (Oral)    Wt 246 lb 6.4 oz (111.8 kg)    SpO2 98%    BMI 37.46 kg/m       Assessment & Plan:  Leg edema - Plan: CBC with Differential/Platelet, Comprehensive  metabolic panel, Brain natriuretic peptide, she has been taking Lasix self prescribed daily for the past month. Also taking potassium supplement. Negative doppler of left leg. LLE is neurovascularly intact. No sign of PE or an acute cardiopulmonary issue. Appears euvolemic. Recommend watching sodium, elevating her legs when sitting and increasing her walking to every hour while awake. Try compression stockings, she has these at home. Consider medications at source. Follow up pending labs.   Chronic kidney disease, unspecified CKD stage - Plan: Comprehensive metabolic panel, recheck renal function. Adjust medication as needed  Essential hypertension - Plan: CBC with Differential/Platelet, Comprehensive metabolic panel, at goal range. Continue current medications.   Fluid retention - Plan: Brain natriuretic peptide, no sign of hypervolemia.   Arthritis of left knee - Plan: consider follow up with orthopedist

## 2018-11-07 NOTE — Patient Instructions (Addendum)
  Continue elevating your legs when sitting. Try to get up every hour while awake and walk.  Continue eating and drinking low sodium.  We will call you with your lab results.    Peripheral Edema  Peripheral edema is swelling that is caused by a buildup of fluid. Peripheral edema most often affects the lower legs, ankles, and feet. It can also develop in the arms, hands, and face. The area of the body that has peripheral edema will look swollen. It may also feel heavy or warm. Your clothes may start to feel tight. Pressing on the area may make a temporary dent in your skin. You may not be able to move your swollen arm or leg as much as usual. There are many causes of peripheral edema. It can happen because of a complication of other conditions such as congestive heart failure, kidney disease, or a problem with your blood circulation. It also can be a side effect of certain medicines or because of an infection. It often happens to women during pregnancy. Sometimes, the cause is not known. Follow these instructions at home: Managing pain, stiffness, and swelling   Raise (elevate) your legs while you are sitting or lying down.  Move around often to prevent stiffness and to lessen swelling.  Do not sit or stand for long periods of time.  Wear support stockings as told by your health care provider. Medicines  Take over-the-counter and prescription medicines only as told by your health care provider.  Your health care provider may prescribe medicine to help your body get rid of excess water (diuretic). General instructions  Pay attention to any changes in your symptoms.  Follow instructions from your health care provider about limiting salt (sodium) in your diet. Sometimes, eating less salt may reduce swelling.  Moisturize skin daily to help prevent skin from cracking and draining.  Keep all follow-up visits as told by your health care provider. This is important. Contact a health care  provider if you have:  A fever.  Edema that starts suddenly or is getting worse, especially if you are pregnant or have a medical condition.  Swelling in only one leg.  Increased swelling, redness, or pain in one or both of your legs.  Drainage or sores at the area where you have edema. Get help right away if you:  Develop shortness of breath, especially when you are lying down.  Have pain in your chest or abdomen.  Feel weak.  Feel faint. Summary  Peripheral edema is swelling that is caused by a buildup of fluid. Peripheral edema most often affects the lower legs, ankles, and feet.  Move around often to prevent stiffness and to lessen swelling. Do not sit or stand for long periods of time.  Pay attention to any changes in your symptoms.  Contact a health care provider if you have edema that starts suddenly or is getting worse, especially if you are pregnant or have a medical condition.  Get help right away if you develop shortness of breath, especially when lying down. This information is not intended to replace advice given to you by your health care provider. Make sure you discuss any questions you have with your health care provider. Document Released: 05/12/2004 Document Revised: 12/27/2017 Document Reviewed: 12/27/2017 Elsevier Patient Education  2020 [employer].

## 2018-11-08 ENCOUNTER — Other Ambulatory Visit: Payer: Self-pay

## 2018-11-08 ENCOUNTER — Emergency Department (HOSPITAL_COMMUNITY)
Admission: EM | Admit: 2018-11-08 | Discharge: 2018-11-08 | Disposition: A | Discharge status: Home / Self Care | Payer: PPO | Attending: Emergency Medicine | Admitting: Emergency Medicine

## 2018-11-08 ENCOUNTER — Encounter (HOSPITAL_COMMUNITY): Payer: Self-pay | Admitting: Emergency Medicine

## 2018-11-08 ENCOUNTER — Other Ambulatory Visit: Payer: Self-pay | Admitting: Internal Medicine

## 2018-11-08 DIAGNOSIS — J449 Chronic obstructive pulmonary disease, unspecified: Secondary | ICD-10-CM | POA: Insufficient documentation

## 2018-11-08 DIAGNOSIS — E039 Hypothyroidism, unspecified: Secondary | ICD-10-CM | POA: Insufficient documentation

## 2018-11-08 DIAGNOSIS — I509 Heart failure, unspecified: Secondary | ICD-10-CM | POA: Insufficient documentation

## 2018-11-08 DIAGNOSIS — I110 Hypertensive heart disease with heart failure: Secondary | ICD-10-CM | POA: Insufficient documentation

## 2018-11-08 DIAGNOSIS — E875 Hyperkalemia: Secondary | ICD-10-CM

## 2018-11-08 DIAGNOSIS — Z79899 Other long term (current) drug therapy: Secondary | ICD-10-CM | POA: Insufficient documentation

## 2018-11-08 DIAGNOSIS — Z87891 Personal history of nicotine dependence: Secondary | ICD-10-CM | POA: Diagnosis not present

## 2018-11-08 DIAGNOSIS — I10 Essential (primary) hypertension: Secondary | ICD-10-CM | POA: Diagnosis not present

## 2018-11-08 LAB — BASIC METABOLIC PANEL
Anion gap: 10 (ref 5–15)
Anion gap: 9 (ref 5–15)
BUN: 33 mg/dL — ABNORMAL HIGH (ref 8–23)
BUN: 34 mg/dL — ABNORMAL HIGH (ref 8–23)
CO2: 20 mmol/L — ABNORMAL LOW (ref 22–32)
CO2: 21 mmol/L — ABNORMAL LOW (ref 22–32)
Calcium: 9.7 mg/dL (ref 8.9–10.3)
Calcium: 9.8 mg/dL (ref 8.9–10.3)
Chloride: 109 mmol/L (ref 98–111)
Chloride: 108 mmol/L (ref 98–111)
Creatinine, Ser: 1.73 mg/dL — ABNORMAL HIGH (ref 0.44–1.00)
Creatinine, Ser: 1.64 mg/dL — ABNORMAL HIGH (ref 0.44–1.00)
GFR calc Af Amer: 34 mL/min — ABNORMAL LOW (ref >60)
GFR calc Af Amer: 36 mL/min — ABNORMAL LOW (ref >60)
GFR calc non Af Amer: 29 mL/min — ABNORMAL LOW (ref >60)
GFR calc non Af Amer: 31 mL/min — ABNORMAL LOW (ref >60)
Glucose, Bld: 115 mg/dL — ABNORMAL HIGH (ref 70–99)
Glucose, Bld: 135 mg/dL — ABNORMAL HIGH (ref 70–99)
Potassium: 5.5 mmol/L — ABNORMAL HIGH (ref 3.5–5.1)
Potassium: 5.9 mmol/L — ABNORMAL HIGH (ref 3.5–5.1)
Sodium: 139 mmol/L (ref 135–145)
Sodium: 138 mmol/L (ref 135–145)

## 2018-11-08 LAB — CBC WITH DIFFERENTIAL/PLATELET
Abs Immature Granulocytes: 0.15 10*3/uL — ABNORMAL HIGH (ref 0.00–0.07)
Basophils Absolute: 0.1 10*3/uL (ref 0.0–0.2)
Basophils Absolute: 0.1 10*3/uL (ref 0.0–0.1)
Basophils Relative: 1 %
Basos: 1 %
EOS (ABSOLUTE): 0.4 10*3/uL (ref 0.0–0.4)
Eos: 5 %
Eosinophils Absolute: 0.4 10*3/uL (ref 0.0–0.5)
Eosinophils Relative: 5 %
HCT: 38.3 % (ref 36.0–46.0)
Hematocrit: 35 % (ref 34.0–46.6)
Hemoglobin: 11.7 g/dL (ref 11.1–15.9)
Hemoglobin: 12 g/dL (ref 12.0–15.0)
Immature Grans (Abs): 0.1 10*3/uL (ref 0.0–0.1)
Immature Granulocytes: 1 %
Immature Granulocytes: 2 %
Lymphocytes Absolute: 1.6 10*3/uL (ref 0.7–3.1)
Lymphocytes Relative: 35 %
Lymphs Abs: 3.1 10*3/uL (ref 0.7–4.0)
Lymphs: 21 %
MCH: 32.1 pg (ref 26.6–33.0)
MCH: 32 pg (ref 26.0–34.0)
MCHC: 33.4 g/dL (ref 31.5–35.7)
MCHC: 31.3 g/dL (ref 30.0–36.0)
MCV: 96 fL (ref 79–97)
MCV: 102.1 fL — ABNORMAL HIGH (ref 80.0–100.0)
Monocytes Absolute: 0.6 10*3/uL (ref 0.1–0.9)
Monocytes Absolute: 0.9 10*3/uL (ref 0.1–1.0)
Monocytes Relative: 10 %
Monocytes: 8 %
Neutro Abs: 4.2 10*3/uL (ref 1.7–7.7)
Neutrophils Absolute: 4.9 10*3/uL (ref 1.4–7.0)
Neutrophils Relative %: 47 %
Neutrophils: 64 %
Platelets: 228 10*3/uL (ref 150–450)
Platelets: 238 10*3/uL (ref 150–400)
RBC: 3.64 x10E6/uL — ABNORMAL LOW (ref 3.77–5.28)
RBC: 3.75 MIL/uL — ABNORMAL LOW (ref 3.87–5.11)
RDW: 13 % (ref 11.7–15.4)
RDW: 13.8 % (ref 11.5–15.5)
WBC: 7.7 10*3/uL (ref 3.4–10.8)
WBC: 8.9 10*3/uL (ref 4.0–10.5)
nRBC: 0 % (ref 0.0–0.2)

## 2018-11-08 LAB — COMPREHENSIVE METABOLIC PANEL
ALT: 21 IU/L (ref 0–32)
AST: 19 IU/L (ref 0–40)
Albumin/Globulin Ratio: 2.1 (ref 1.2–2.2)
Albumin: 4.5 g/dL (ref 3.7–4.7)
Alkaline Phosphatase: 97 IU/L (ref 39–117)
BUN/Creatinine Ratio: 22 (ref 12–28)
BUN: 32 mg/dL — ABNORMAL HIGH (ref 8–27)
Bilirubin Total: 0.3 mg/dL (ref 0.0–1.2)
CO2: 17 mmol/L — ABNORMAL LOW (ref 20–29)
Calcium: 10.4 mg/dL — ABNORMAL HIGH (ref 8.7–10.3)
Chloride: 107 mmol/L — ABNORMAL HIGH (ref 96–106)
Creatinine, Ser: 1.47 mg/dL — ABNORMAL HIGH (ref 0.57–1.00)
GFR calc Af Amer: 41 mL/min/{1.73_m2} — ABNORMAL LOW (ref >59)
GFR calc non Af Amer: 35 mL/min/{1.73_m2} — ABNORMAL LOW (ref >59)
Globulin, Total: 2.1 g/dL (ref 1.5–4.5)
Glucose: 106 mg/dL — ABNORMAL HIGH (ref 65–99)
Potassium: 6.6 mmol/L (ref 3.5–5.2)
Sodium: 140 mmol/L (ref 134–144)
Total Protein: 6.6 g/dL (ref 6.0–8.5)

## 2018-11-08 LAB — MAGNESIUM: Magnesium: 2.3 mg/dL (ref 1.7–2.4)

## 2018-11-08 LAB — BRAIN NATRIURETIC PEPTIDE: BNP: 51.8 pg/mL (ref 0.0–100.0)

## 2018-11-08 MED ORDER — SODIUM ZIRCONIUM CYCLOSILICATE 10 G PO PACK
10.0000 g | PACK | Freq: Once | ORAL | Status: AC
  Administered 2018-11-08: 10 g via ORAL
  Filled 2018-11-08: qty 1

## 2018-11-08 MED ORDER — LOKELMA 10 G PO PACK: 10.0000 g | PACK | Freq: Every day | ORAL | 0 refills | Status: AC

## 2018-11-08 MED ORDER — FUROSEMIDE 10 MG/ML IJ SOLN
40.0000 mg | Freq: Once | INTRAMUSCULAR | Status: AC
  Administered 2018-11-08: 11:00:00 40 mg via INTRAVENOUS
  Filled 2018-11-08: qty 4

## 2018-11-08 NOTE — Discharge Instructions (Addendum)
Please take the medicine prescribed. Please have your potassium checked tomorrow. If PCP is unable to check, then return to the ER in 2 days.  Please also take one more lasix table for the next 3 days and stop your potassium. Return to the ER if there is chest pain, shortness of breath, fainting, palpitations, dizziness.

## 2018-11-08 NOTE — ED Triage Notes (Signed)
Pt states she had back surgery in March and has had bilateral feet swelling. Pt went for a follow up on her swelling yesterday. Pt has been taking lasix and potassium since it started swelling. Pt was called this morning and told her potassium was 6.6. Denies CP

## 2018-11-08 NOTE — ED Provider Notes (Addendum)
[facility] EMERGENCY DEPARTMENT Provider Note   CSN: [id number] Arrival date & time: 11/08/18  0820    History   Chief Complaint No chief complaint on file.   HPI Brandi Stephens is a 73 y.o. female.     HPI  51 year old with history of CHF, COPD comes in a chief complaint of elevated potassium. Patient reports that she takes potassium with her Lasix.  She was seen in the clinic recently for her left lower extremity swelling, and basic blood work was completed.  She received a call today stating that her potassium was high then she needed to come to the ER immediately.  Patient is denying any chest pain, palpitations, dizziness, near syncope or syncope.  She also denies any muscle aches or spasms.  She has no known history of severe kidney disease.  Past Medical History:  Diagnosis Date  . AIN (anal intraepithelial neoplasia) anal canal   . Anemia    with pregnancy  . Anxiety   . Arthritis    knees, lower back, knees  . Chronic constipation   . Coarse tremors    Essential  . COPD (chronic obstructive pulmonary disease) ([place])    2017 chest CT, history of  . Degenerative lumbar spinal stenosis   . GERD (gastroesophageal reflux disease)    pt denies  . Gout   . Grade I diastolic dysfunction 26/83/4196   Noted ECHO  . Hemorrhoids   . History of adenomatous polyp of colon   . History of basal cell carcinoma (BCC) excision 2015   nose  . History of sepsis 05/14/2014   post lumbar surgery  . History of shingles    x2  . Hyperlipidemia   . Hypertension   . Hypothyroidism   . Irregular heart beat    history of while undergoing radiation  . Malignant neoplasm of upper-inner quadrant of left breast in female, estrogen receptor positive [facility]) oncologist-  dr Jana Hakim--  per lov in epic no recurrence   dx 07-13-2015  Left breast invasive ductal carcinoma, Stage IA, Grade 1 (TXN0),  09-16-2015  s/p  left breast lumpectomy with sln bx's,  completed  radiation 11-18-2015,  started antiestrogen therapy 12-14-2015  . Neuropathy    Left foot and back of left leg  . Personal history of radiation therapy    completed 11-18-2015  left breast  . Pneumonia   . PONV (postoperative nausea and vomiting)   . Prediabetes    diet controlled, no med  . Restless leg syndrome   . Seasonal allergies   . Seizure ([place]) 05/2015   due to sepsis, only one time episode  . Wears partial dentures    lower     Patient Active Problem List   Diagnosis Date Noted  . Lumbar spinal stenosis 06/29/2018  . Preoperative clearance 06/04/2018  . Abnormal CT of the chest 06/04/2018  . Smoker 05/17/2018  . History of COPD 05/17/2018  . Hypothyroidism 11/30/2017  . Thoracic aortic atherosclerosis ([place]) 09/14/2016  . Benign essential tremor 09/09/2016  . RLS (restless legs syndrome) 09/09/2016  . H/O adenomatous polyp of colon 07/06/2016  . PVC's (premature ventricular contractions) 03/29/2016  . Preoperative cardiovascular examination 03/29/2016  . Palpitations 01/11/2016  . Shortness of breath 01/11/2016  . Other fatigue 01/11/2016  . Former cigarette smoker 01/11/2016  . Tachycardia 01/11/2016  . Leg edema 01/11/2016  . Chronic cough 10/28/2015  . 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 Malignant neoplasm of upper-inner quadrant of left breast  in female, estrogen receptor positive ([place]) 07/14/2015  . Prediabetes 07/08/2015  . Chronic kidney disease 07/08/2015  . Dependent edema 06/08/2015  . Sleep disturbance 06/08/2015  . Hyperlipidemia 06/08/2015  . Generalized anxiety disorder 06/08/2015  . Essential hypertension 06/08/2015  . Emphysema lung ([place]) 06/08/2015  . Gout 06/08/2015  . Macrocytosis   . 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 Lumbar spondylosis 03/20/2015  . Paresthesias 04/01/2013    Past Surgical History:  Procedure Laterality Date  . ABDOMINAL HYSTERECTOMY  1985  . BASAL CELL CARCINOMA EXCISION  10/15   nose  . BREAST BIOPSY  Right 08/24/2015  . BREAST BIOPSY Right 07/13/2015  . BREAST BIOPSY Left 07/13/2015  . BREAST EXCISIONAL BIOPSY Left 1972  . BREAST LUMPECTOMY WITH RADIOACTIVE SEED AND SENTINEL LYMPH NODE BIOPSY Left 09/16/2015   Procedure: BREAST LUMPECTOMY WITH RADIOACTIVE SEED AND SENTINEL LYMPH NODE BIOPSY;  Surgeon: Stark Klein, MD;  Location: [facility];  Service: General;  Laterality: Left;  . BREAST REDUCTION SURGERY Bilateral 1998  . CATARACT EXTRACTION W/ INTRAOCULAR LENS IMPLANT Left 2016  . [place]   hallo  . COLONOSCOPY  01/2013   polyps  . EYE SURGERY Left 2016  . HAND SURGERY Left 02-19-2002   dr Fredna Dow  @[facility]    repair collateral ligament/ MPJ left thumb  . [place]  2008  . HIGH RESOLUTION ANOSCOPY N/A 02/28/2018   Procedure: HIGH RESOLUTION ANOSCOPY WITH BIOPSY;  Surgeon: Leighton Ruff, MD;  Location[facility];  Service: General;  Laterality: N/A;  . KNEE ARTHROSCOPY Right 2002  . LUMBAR SPINE SURGERY  03-20-2015   dr Kathyrn Sheriff   fusion L4-5, L5-S1  . MOUTH SURGERY  07/14/2018   2 infected teeth removed  . MULTIPLE TOOTH EXTRACTIONS     with bone grafting lower bottom right and left  . RIGHT COLECTOMY  09-10-2003   dr Margot Chimes @[facility]    multiple colon polyps (per path tubular adenoma's, hyperplastic , benign appendix, benign two lymph nodes  . ROTATOR CUFF REPAIR Left 04/2016  . TONSILLECTOMY  1969  . TUBAL LIGATION Bilateral 1982     OB History   No obstetric history on file.      Home Medications    Prior to Admission medications   Medication Sig Start Date End Date Taking? Authorizing Provider  albuterol (PROVENTIL HFA;VENTOLIN HFA) 108 (90 Base) MCG/ACT inhaler INHALE 2 PUFFS BY MOUTH EVERY 4 TO 6 HOURS AS NEEDED Patient taking differently: Inhale 2 puffs into the lungs every 6 (six) hours as needed for wheezing.  05/03/18  Yes Henson, Vickie L, NP-C  allopurinol (ZYLOPRIM) 100 MG tablet TAKE 2 TABLETS(200 MG)  BY MOUTH DAILY Patient taking differently: Take 200 mg by mouth daily.  10/23/18  Yes Henson, Vickie L, NP-C  anastrozole (ARIMIDEX) 1 MG tablet TAKE 1 TABLET(1 MG) BY MOUTH DAILY Patient taking differently: Take 1 mg by mouth daily.  10/23/18  Yes Magrinat, Virgie Dad, MD  atorvastatin (LIPITOR) 20 MG tablet TAKE 1 TABLET BY MOUTH EVERY DAY Patient taking differently: Take 20 mg by mouth daily.  10/23/18  Yes Henson, Vickie L, NP-C  baclofen (LIORESAL) 10 MG tablet Take 1 tablet (10 mg) once daily as needed. Patient taking differently: Take 10 mg by mouth as needed.  08/16/18  Yes Henson, Vickie L, NP-C  Biotin (BIOTIN 5000) 5 MG CAPS Take 5 mg by mouth daily.    Yes [provider]  busPIRone (BUSPAR) 15 MG tablet  Take 15 mg by mouth 3 (three) times daily.  09/28/17  Yes [provider]  Calcium Carbonate-Vitamin D (CALCIUM 500/D PO) Take 2 each by mouth daily.    Yes [provider]  colchicine 0.6 MG tablet Take 2 tablets now and 1 tablet an hour later for flare up Patient taking differently: Take 0.6 mg by mouth daily as needed (for gout flare up).  07/10/17  Yes Henson, Vickie L, NP-C  diclofenac (VOLTAREN) 75 MG EC tablet Take 75 mg by mouth 2 (two) times daily.  10/31/18  Yes [provider]  Eszopiclone (ESZOPICLONE) 3 MG TABS Take 3 mg by mouth at bedtime. Take immediately before bedtime   Yes [provider]  furosemide (LASIX) 20 MG tablet Take 1 tablet (20 mg total) by mouth daily as needed. Reported on 10/28/2015 Patient taking differently: Take 20 mg by mouth daily.  12/04/17  Yes Henson, Vickie L, NP-C  hydrOXYzine (VISTARIL) 25 MG capsule Take 25 mg by mouth 2 (two) times daily as needed for anxiety.   Yes [provider]  levothyroxine (SYNTHROID) 75 MCG tablet TAKE 1 TABLET(75 MCG) BY MOUTH DAILY BEFORE BREAKFAST Patient taking differently: Take 75 mcg by mouth daily before breakfast.  10/23/18  Yes Henson, Vickie L, NP-C  LINZESS 145 MCG  CAPS capsule TAKE 1 CAPSULE BY MOUTH EVERY DAY BEFORE BREAKFAST Patient taking differently: Take 145 mcg by mouth as needed (constipation).  03/08/18  Yes Henson, Vickie L, NP-C  Magnesium 500 MG CAPS Take 500 mg by mouth daily.   Yes [provider]  Melatonin 3 MG CAPS Take 9 mg by mouth at bedtime.   Yes [provider]  metoprolol succinate (TOPROL-XL) 25 MG 24 hr tablet TAKE 1/2 TABLET(12.5 MG) BY MOUTH DAILY Patient taking differently: Take 12.5 mg by mouth at bedtime.  06/25/18  Yes Hilty, Nadean Corwin, MD  olmesartan-hydrochlorothiazide (BENICAR HCT) 40-12.5 MG tablet TAKE 1 TABLET BY MOUTH DAILY 10/23/18  Yes Henson, Vickie L, NP-C  polyethylene glycol powder (MIRALAX) powder Take 17 g by mouth daily as needed for moderate constipation.    Yes [provider]  primidone (MYSOLINE) 50 MG tablet TAKE 1 TABLET(50 MG) BY MOUTH AT BEDTIME Patient taking differently: Take 50 mg by mouth at bedtime.  10/23/18  Yes Henson, Vickie L, NP-C  promethazine (PHENERGAN) 12.5 MG tablet Take 1 tablet (12.5 mg total) by mouth every 8 (eight) hours as needed for nausea or vomiting. 08/30/18  Yes Henson, Vickie L, NP-C  rOPINIRole (REQUIP) 0.5 MG tablet Take 1.5 tablets (0.75 mg total) by mouth at bedtime. Patient taking differently: Take 0.5 mg by mouth at bedtime.  04/20/18  Yes Patel, Donika K, DO  vitamin B-12 (CYANOCOBALAMIN) 1000 MCG tablet Take 1,000 mcg by mouth daily.   Yes [provider]  potassium chloride (KLOR-CON 10) 10 MEQ tablet Take 1 tablet (10 mEq total) by mouth as needed. Reported on 10/28/2015 Patient taking differently: Take 10 mEq by mouth daily.  12/04/17 11/08/18 Yes Henson, Vickie L, NP-C  fluticasone furoate-vilanterol (BREO ELLIPTA) 100-25 MCG/INH AEPB Inhale 1 puff into the lungs daily. Patient not taking: Reported on 11/08/2018 06/04/18   Parrett, Fonnie Mu, NP  sodium zirconium cyclosilicate (LOKELMA) 10 g PACK packet Take 10 g by mouth daily for 2 doses.  11/08/18 11/10/18  Varney Biles, MD  traMADol (ULTRAM) 50 MG tablet Take 1 tablet (50 mg total) by mouth every 6 (six) hours as needed for moderate pain. Patient not taking:  Reported on 11/07/2018 05/17/18   Girtha Rm, NP-C    Family History Family History  Problem Relation Age of Onset  . Atrial fibrillation Mother   . Breast cancer Maternal Aunt   . Lung cancer Maternal Uncle   . Stomach cancer Maternal Grandmother   . Lung cancer Maternal Aunt   . Brain cancer Maternal Aunt   . Prostate cancer Maternal Grandfather   . Stroke Paternal Grandmother   . Cancer Paternal Grandfather   . Colon cancer Neg Hx   . Esophageal cancer Neg Hx   . Rectal cancer Neg Hx     Social History Social History   Tobacco Use  . Smoking status: Former Smoker    Packs/day: 0.50    Years: 35.00    Pack years: 17.50    Types: Cigarettes    Quit date: 05/22/2018    Years since quitting: 0.4  . Smokeless tobacco: Never Used  Substance Use Topics  . Alcohol use: Yes    Alcohol/week: 9.0 standard drinks    Types: 9 Glasses of wine per week    Comment: 1 drink nightly, 2 on weekends  . Drug use: No     Allergies   Ampicillin, Penicillins, Morphine and related, Other, Codeine, Hydrocodone-acetaminophen, and Hydromorphone   Review of Systems Review of Systems  Constitutional: Negative for activity change.  Cardiovascular: Negative for palpitations.  Gastrointestinal: Negative for nausea and vomiting.  Musculoskeletal: Negative for myalgias.  Neurological: Negative for dizziness.     Physical Exam Updated Vital Signs BP 119/65   Pulse 63   Temp 98.8 F (37.1 C)   Resp 17   Ht 5\' 8"  (1.727 m)   Wt 108.9 kg   SpO2 92%   BMI 36.49 kg/m   Physical Exam Vitals signs and nursing note reviewed.  Constitutional:      Appearance: She is well-developed.  HENT:     Head: Normocephalic and atraumatic.  Eyes:     Pupils: Pupils are equal, round, and reactive to light.  Neck:      Musculoskeletal: Neck supple.  Cardiovascular:     Rate and Rhythm: Normal rate and regular rhythm.     Heart sounds: Normal heart sounds. No murmur.  Pulmonary:     Effort: Pulmonary effort is normal. No respiratory distress.  Abdominal:     General: There is no distension.     Palpations: Abdomen is soft.     Tenderness: There is no abdominal tenderness. There is no guarding or rebound.  Musculoskeletal:        General: Swelling present.     Comments: Left lower extremity slightly edematous  Skin:    General: Skin is warm and dry.  Neurological:     Mental Status: She is alert and oriented to person, place, and time.      ED Treatments / Results  Labs (all labs ordered are listed, but only abnormal results are displayed) Labs Reviewed  CBC WITH DIFFERENTIAL/PLATELET - Abnormal; Notable for the following components:      Result Value   RBC 3.75 (*)    MCV 102.1 (*)    Abs Immature Granulocytes 0.15 (*)    All other components within normal limits  BASIC METABOLIC PANEL - Abnormal; Notable for the following components:   Potassium 5.5 (*)    CO2 20 (*)    Glucose, Bld 115 (*)    BUN 33 (*)    Creatinine, Ser 1.73 (*)    GFR calc  non Af Amer 29 (*)    GFR calc Af Amer 34 (*)    All other components within normal limits  BASIC METABOLIC PANEL - Abnormal; Notable for the following components:   Potassium 5.9 (*)    CO2 21 (*)    Glucose, Bld 135 (*)    BUN 34 (*)    Creatinine, Ser 1.64 (*)    GFR calc non Af Amer 31 (*)    GFR calc Af Amer 36 (*)    All other components within normal limits  MAGNESIUM    EKG EKG Interpretation  Date/Time:  Thursday November 08 2018 08:38:57 EDT Ventricular Rate:  81 PR Interval:    QRS Duration: 93 QT Interval:  363 QTC Calculation: 422 R Axis:   10 Text Interpretation:  Sinus rhythm Low voltage, precordial leads Nonspecific T abnrm, anterolateral leads No acute changes Confirmed by Varney Biles [phone]) on 11/08/2018 12:50:17  PM   Radiology No results found.  Procedures Procedures (including critical care time)  Medications Ordered in ED Medications  furosemide (LASIX) injection 40 mg (40 mg Intravenous Given 11/08/18 1058)  sodium zirconium cyclosilicate (LOKELMA) packet 10 g (10 g Oral Given 11/08/18 1210)     Initial Impression / Assessment and Plan / ED Course  I have reviewed the triage vital signs and the nursing notes.  Pertinent labs & imaging results that were available during my care of the patient were reviewed by me and considered in my medical decision making (see chart for details).  Clinical Course as of Nov 07 1400  Thu Nov 08, 2018  1253 Potassium is 5.5.  BUN and creatinine are slightly elevated with creatinine at 1.73.  Patient's baseline creatinine appears to be around 1.5. We will give her IV Lasix and Lokema.  EKG has no acute findings.  If the repeat K is not rising we will discharge.  Creatinine(!): 1.73 [AN]  1401 Repeat potassium is 5.9. Unsure why. Patient prefers going home.  She has no symptoms of hyperkalemia and the EKG had no acute findings. We will give her lokema prescription for 2 days, have her take an extra dose of Lasix for the next 3 days, and stop her potassium.  She has been advised to drink plenty of water and to get her potassium rechecked tomorrow.  If the PCP is unable to recheck potassium tomorrow then she will come to the ER on Saturday.  The patient appears reasonably screened and/or stabilized for discharge and I doubt any other medical condition or other [facility] requiring further screening, evaluation, or treatment in the ED at this time prior to discharge.  Results from the ER workup discussed with the patient face to face and all questions answered to the best of my ability. The patient is safe for discharge with strict return precautions.    Potassium(!): 5.9 [AN]    Clinical Course User Index [AN] Varney Biles, MD       73 year old comes in a  chief complaint of elevated potassium.  She takes oral potassium because of congruent Lasix use. She is in the ER mainly because of the elevated potassium.  Lab report indicates her potassium was 6.6.  EKG is no acute findings.  We will get repeat labs here.  If the K is elevated then we will address it with medications.  Unless she has severe renal failure we do not think she will need admission.  Final Clinical Impressions(s) / ED Diagnoses   Final diagnoses:  Hyperkalemia  ED Discharge Orders         Ordered    sodium zirconium cyclosilicate (LOKELMA) 10 g PACK packet  Daily     11/08/18 1334             Varney Biles, MD 11/08/18 1255    Varney Biles, MD 11/08/18 1402

## 2018-11-08 NOTE — ED Notes (Signed)
Pt up to void x 2 by herself

## 2018-11-09 ENCOUNTER — Other Ambulatory Visit: Payer: PPO

## 2018-11-09 ENCOUNTER — Other Ambulatory Visit: Payer: Self-pay

## 2018-11-09 DIAGNOSIS — E875 Hyperkalemia: Secondary | ICD-10-CM | POA: Diagnosis not present

## 2018-11-09 LAB — BASIC METABOLIC PANEL
BUN/Creatinine Ratio: 20 (ref 12–28)
BUN: 33 mg/dL — ABNORMAL HIGH (ref 8–27)
CO2: 25 mmol/L (ref 20–29)
Calcium: 9.4 mg/dL (ref 8.7–10.3)
Chloride: 103 mmol/L (ref 96–106)
Creatinine, Ser: 1.66 mg/dL — ABNORMAL HIGH (ref 0.57–1.00)
GFR calc Af Amer: 35 mL/min/{1.73_m2} — ABNORMAL LOW (ref >59)
GFR calc non Af Amer: 31 mL/min/{1.73_m2} — ABNORMAL LOW (ref >59)
Glucose: 137 mg/dL — ABNORMAL HIGH (ref 65–99)
Potassium: 4.8 mmol/L (ref 3.5–5.2)
Sodium: 138 mmol/L (ref 134–144)

## 2018-11-12 ENCOUNTER — Telehealth: Payer: Self-pay | Admitting: *Deleted

## 2018-11-12 NOTE — Telephone Encounter (Signed)
This RN attempted to return VM left by pt stating she was seen in the ER and had multiple labs done for elevated potassium levels and wanted to inquire if further labs needed and then if insurance would pay.  This RN noted pt went to the ER last week with potassium of 6.6 with rechecks before being discharged with level of 4.8.  Per call - phone rang 2 x then disconnected- attempt made x 2 with same outcome.

## 2018-11-13 ENCOUNTER — Encounter: Payer: Self-pay | Admitting: Oncology

## 2018-11-13 ENCOUNTER — Inpatient Hospital Stay (HOSPITAL_BASED_OUTPATIENT_CLINIC_OR_DEPARTMENT_OTHER): Payer: PPO | Admitting: Oncology

## 2018-11-13 ENCOUNTER — Inpatient Hospital Stay: Payer: PPO | Attending: Oncology

## 2018-11-13 ENCOUNTER — Other Ambulatory Visit: Payer: Self-pay

## 2018-11-13 VITALS — BP 125/84 | HR 66 | Temp 98.3°F | Resp 17 | Ht 68.0 in | Wt 244.0 lb

## 2018-11-13 DIAGNOSIS — F419 Anxiety disorder, unspecified: Secondary | ICD-10-CM

## 2018-11-13 DIAGNOSIS — E039 Hypothyroidism, unspecified: Secondary | ICD-10-CM | POA: Insufficient documentation

## 2018-11-13 DIAGNOSIS — R600 Localized edema: Secondary | ICD-10-CM

## 2018-11-13 DIAGNOSIS — I10 Essential (primary) hypertension: Secondary | ICD-10-CM | POA: Diagnosis not present

## 2018-11-13 DIAGNOSIS — Z923 Personal history of irradiation: Secondary | ICD-10-CM | POA: Diagnosis not present

## 2018-11-13 DIAGNOSIS — M1990 Unspecified osteoarthritis, unspecified site: Secondary | ICD-10-CM | POA: Insufficient documentation

## 2018-11-13 DIAGNOSIS — Z79899 Other long term (current) drug therapy: Secondary | ICD-10-CM | POA: Diagnosis not present

## 2018-11-13 DIAGNOSIS — C50212 Malignant neoplasm of upper-inner quadrant of left female breast: Secondary | ICD-10-CM | POA: Insufficient documentation

## 2018-11-13 DIAGNOSIS — Z170 Estrogen receptor positive status [ER+]: Secondary | ICD-10-CM

## 2018-11-13 DIAGNOSIS — Z79811 Long term (current) use of aromatase inhibitors: Secondary | ICD-10-CM | POA: Insufficient documentation

## 2018-11-13 DIAGNOSIS — Z85828 Personal history of other malignant neoplasm of skin: Secondary | ICD-10-CM

## 2018-11-13 DIAGNOSIS — M549 Dorsalgia, unspecified: Secondary | ICD-10-CM

## 2018-11-13 DIAGNOSIS — Z87891 Personal history of nicotine dependence: Secondary | ICD-10-CM

## 2018-11-13 DIAGNOSIS — I700 Atherosclerosis of aorta: Secondary | ICD-10-CM

## 2018-11-13 DIAGNOSIS — J449 Chronic obstructive pulmonary disease, unspecified: Secondary | ICD-10-CM | POA: Insufficient documentation

## 2018-11-13 DIAGNOSIS — G479 Sleep disorder, unspecified: Secondary | ICD-10-CM

## 2018-11-13 DIAGNOSIS — R7303 Prediabetes: Secondary | ICD-10-CM

## 2018-11-13 DIAGNOSIS — N6310 Unspecified lump in the right breast, unspecified quadrant: Secondary | ICD-10-CM

## 2018-11-13 LAB — CBC WITH DIFFERENTIAL/PLATELET
Abs Immature Granulocytes: 0.13 10*3/uL — ABNORMAL HIGH (ref 0.00–0.07)
Basophils Absolute: 0.1 10*3/uL (ref 0.0–0.1)
Basophils Relative: 1 %
Eosinophils Absolute: 0.4 10*3/uL (ref 0.0–0.5)
Eosinophils Relative: 4 %
HCT: 36.2 % (ref 36.0–46.0)
Hemoglobin: 11.6 g/dL — ABNORMAL LOW (ref 12.0–15.0)
Immature Granulocytes: 1 %
Lymphocytes Relative: 21 %
Lymphs Abs: 1.9 10*3/uL (ref 0.7–4.0)
MCH: 31.5 pg (ref 26.0–34.0)
MCHC: 32 g/dL (ref 30.0–36.0)
MCV: 98.4 fL (ref 80.0–100.0)
Monocytes Absolute: 0.6 10*3/uL (ref 0.1–1.0)
Monocytes Relative: 7 %
Neutro Abs: 6 10*3/uL (ref 1.7–7.7)
Neutrophils Relative %: 66 %
Platelets: 237 10*3/uL (ref 150–400)
RBC: 3.68 MIL/uL — ABNORMAL LOW (ref 3.87–5.11)
RDW: 13.6 % (ref 11.5–15.5)
WBC: 9.1 10*3/uL (ref 4.0–10.5)
nRBC: 0 % (ref 0.0–0.2)

## 2018-11-13 LAB — COMPREHENSIVE METABOLIC PANEL
ALT: 19 U/L (ref 0–44)
AST: 16 U/L (ref 15–41)
Albumin: 3.9 g/dL (ref 3.5–5.0)
Alkaline Phosphatase: 98 U/L (ref 38–126)
Anion gap: 11 (ref 5–15)
BUN: 52 mg/dL — ABNORMAL HIGH (ref 8–23)
CO2: 23 mmol/L (ref 22–32)
Calcium: 9.6 mg/dL (ref 8.9–10.3)
Chloride: 108 mmol/L (ref 98–111)
Creatinine, Ser: 2.43 mg/dL — ABNORMAL HIGH (ref 0.44–1.00)
GFR calc Af Amer: 22 mL/min — ABNORMAL LOW (ref >60)
GFR calc non Af Amer: 19 mL/min — ABNORMAL LOW (ref >60)
Glucose, Bld: 132 mg/dL — ABNORMAL HIGH (ref 70–99)
Potassium: 5.3 mmol/L — ABNORMAL HIGH (ref 3.5–5.1)
Sodium: 142 mmol/L (ref 135–145)
Total Bilirubin: 0.2 mg/dL — ABNORMAL LOW (ref 0.3–1.2)
Total Protein: 7 g/dL (ref 6.5–8.1)

## 2018-11-13 NOTE — Progress Notes (Signed)
[place]  Telephone:([phone] [phone]) [phone]     ID: Brandi Stephens DOB: July 18, 1945  MR#: [id number]  KXF#:[id number]  Patient Care Team: Girtha Rm, NP-C as PCP - General (Family Medicine) Shanterica Biehler, Virgie Dad, MD as Consulting Physician (Oncology) Stark Klein, MD as Consulting Physician (General Surgery) [name], MD as Consulting Physician (Family Medicine) [name], MD as Consulting Physician (Neurosurgery) Griselda Miner, MD as Consulting Physician (Dermatology) Delice Bison, Charlestine Massed, NP as Nurse Practitioner (Hematology and Oncology) Marchia Bond, MD as Consulting Physician (Orthopedic Surgery) Clydell Hakim, MD as Consulting Physician (Anesthesiology) Armbruster, Carlota Raspberry, MD as Consulting Physician (Gastroenterology) OTHER MD:  CHIEF COMPLAINT: Estrogen receptor positive breast cancer  CURRENT TREATMENT: [Anastrozole]   INTERVAL HISTORY: [name] returns today for follow-up of her estrogen receptor positive breast cancer.   She continues on anastrozole, with good tolerance.  Since her last visit, she underwent colonoscopy on 01/05/2018 under Dr. Havery Moros. Pathology from the procedure ([id number]) showed:  1. Tubular adenomas without high grade dysplasia or malignancy and hyperplastic polyps (10 total, number of each not specified);   2. Anal Canal: low grade squamous intraepithelial lesion  (AIN-1, low grade dysplasia).  Accordingly, she proceeded to anoscopy with biopsy on 02/28/2018 under Dr. Marcello Moores. Pathology from the procedure ([id number]) also resulted with mild dysplasia.    She was seen on 04/03/2018 by her PCP for pneumonia.  She is followed by Dr. Kathyrn Sheriff for low back pain. She underwent L3-L4 posterior lumbar interbody fusion on 06/29/2018.  She was started on diclofenac for pain  She presented to her PCP on 11/07/2018 with left lower extremity swelling.  She was sent for left lower extremity Doppler  on 11/01/2018 which showed no evidence of an acute DVT.  She had routine blood work done and was found to have elevated potassium at 6.6. She proceeded to the ED the following day. She was treated and repeat potassium on 11/09/2018 was 4.8.  Since her last visit, she has not undergone any breast imaging studies. Her most recent mammogram was on 08/21/2017 at [facility].   REVIEW OF SYSTEMS: Jenascia reports her swelling is better but still present, left worse than right. She reports occasional shooting pains in her breast, as expected. She lives at home with her husband, who is 53 years old; both of them are retired. A detailed review of systems was otherwise stable.     BREAST CANCER HISTORY: From the original intake note:  Brandi Stephens started to feel some right breast pain and soreness in March 2017. She brought it to Dr. Lanice Shirts nurse practitioner, Loletha Carrow and since attention, and she was set up for bilateral diagnostic mammography with tomography at the [facility] 07/02/2015 area did the breast density was category C. The patient is status post bilateral reduction mammoplasties. Mammography showed no suspicious masses or calcifications in the right breast, and ultrasonography of the area where she has discomfort shows an area of distortion in the right breast at the 8:00 position 12 cm from the nipple measuring 1.4 cm thought to represent scarring related to the prior reduction surgery, but warranting further evaluation.  Also, in the left breast there was an area of distortion in the upper inner quadrant noted mammographically. Targeted ultrasound evaluating the left breast found no sonographic correlate to the mammographic findings. Ultrasound evaluation of both axillae were benign.  On 07/13/2015 Brandi Stephens underwent biopsy of the right breast area in question which showed only fibrocystic changes with adenosis. Biopsy of the left  breast upper inner quadrant however showed invasive ductal  carcinoma, grade 1, estrogen receptor 90% positive, progesterone receptor 70% positive, WITH strong staining intensity, with an MIB-1 of 3%, and no HER-2 amplification, the signals ratio being 0.97 and the number per cell 1.65.  The patient's subsequent history is as detailed below   PAST MEDICAL HISTORY: Past Medical History:  Diagnosis Date  . AIN (anal intraepithelial neoplasia) anal canal   . Anemia    with pregnancy  . Anxiety   . Arthritis    knees, lower back, knees  . Chronic constipation   . Coarse tremors    Essential  . COPD (chronic obstructive pulmonary disease) ([place])    2017 chest CT, history of  . Degenerative lumbar spinal stenosis   . GERD (gastroesophageal reflux disease)    pt denies  . Gout   . Grade I diastolic dysfunction 82/64/1583   Noted ECHO  . Hemorrhoids   . History of adenomatous polyp of colon   . History of basal cell carcinoma (BCC) excision 2015   nose  . History of sepsis 05/14/2014   post lumbar surgery  . History of shingles    x2  . Hyperlipidemia   . Hypertension   . Hypothyroidism   . Irregular heart beat    history of while undergoing radiation  . Malignant neoplasm of upper-inner quadrant of left breast in female, estrogen receptor positive [facility]) oncologist-  dr Jana Hakim--  per lov in epic no recurrence   dx 07-13-2015  Left breast invasive ductal carcinoma, Stage IA, Grade 1 (TXN0),  09-16-2015  s/p  left breast lumpectomy with sln bx's,  completed radiation 11-18-2015,  started antiestrogen therapy 12-14-2015  . Neuropathy    Left foot and back of left leg  . Personal history of radiation therapy    completed 11-18-2015  left breast  . Pneumonia   . PONV (postoperative nausea and vomiting)   . Prediabetes    diet controlled, no med  . Restless leg syndrome   . Seasonal allergies   . Seizure ([place]) 05/2015   due to sepsis, only one time episode  . Wears partial dentures    lower     PAST SURGICAL HISTORY: Past Surgical  History:  Procedure Laterality Date  . ABDOMINAL HYSTERECTOMY  1985  . BASAL CELL CARCINOMA EXCISION  10/15   nose  . BREAST BIOPSY Right 08/24/2015  . BREAST BIOPSY Right 07/13/2015  . BREAST BIOPSY Left 07/13/2015  . BREAST EXCISIONAL BIOPSY Left 1972  . BREAST LUMPECTOMY WITH RADIOACTIVE SEED AND SENTINEL LYMPH NODE BIOPSY Left 09/16/2015   Procedure: BREAST LUMPECTOMY WITH RADIOACTIVE SEED AND SENTINEL LYMPH NODE BIOPSY;  Surgeon: Stark Klein, MD;  Location: [facility];  Service: General;  Laterality: Left;  . BREAST REDUCTION SURGERY Bilateral 1998  . CATARACT EXTRACTION W/ INTRAOCULAR LENS IMPLANT Left 2016  . [place]   hallo  . COLONOSCOPY  01/2013   polyps  . EYE SURGERY Left 2016  . HAND SURGERY Left 02-19-2002   dr Fredna Dow  '@[facility]'    repair collateral ligament/ MPJ left thumb  . [place]  2008  . HIGH RESOLUTION ANOSCOPY N/A 02/28/2018   Procedure: HIGH RESOLUTION ANOSCOPY WITH BIOPSY;  Surgeon: Leighton Ruff, MD;  Location: [facility];  Service: General;  Laterality: N/A;  . KNEE ARTHROSCOPY Right 2002  . LUMBAR SPINE SURGERY  03-20-2015   dr Kathyrn Sheriff   fusion L4-5, L5-S1  . MOUTH SURGERY  07/14/2018   2 infected teeth removed  . MULTIPLE TOOTH EXTRACTIONS     with bone grafting lower bottom right and left  . RIGHT COLECTOMY  09-10-2003   dr Margot Chimes '@[facility]'    multiple colon polyps (per path tubular adenoma's, hyperplastic , benign appendix, benign two lymph nodes  . ROTATOR CUFF REPAIR Left 04/2016  . TONSILLECTOMY  1969  . TUBAL LIGATION Bilateral 1982    FAMILY HISTORY Family History  Problem Relation Age of Onset  . Atrial fibrillation Mother   . Breast cancer Maternal Aunt   . Lung cancer Maternal Uncle   . Stomach cancer Maternal Grandmother   . Lung cancer Maternal Aunt   . Brain cancer Maternal Aunt   . Prostate cancer Maternal Grandfather   . Stroke Paternal Grandmother   . Cancer Paternal  Grandfather   . Colon cancer Neg Hx   . Esophageal cancer Neg Hx   . Rectal cancer Neg Hx   The patient's father died at the age of 46 and her mother at the age of 65. The patient had no brothers, 2 sisters. The patient's mother was diagnosed with uterine cancer at the age of 13. The patient's sister was diagnosed with labial cancer at the age of 104. There is a maternal aunt with a history of breast cancer but the patient does not know at what age she was diagnosed. There is also on the mother's side history of lung cancer brain cancer and stomach cancer.    GYNECOLOGIC HISTORY:  No LMP recorded. Patient has had a hysterectomy.  Menarche age 29 first live birth age 60, the patient is [place] P3. She underwent hysterectomy without salpingo-oophorectomy at age 49. She took hormone replacement only for a few months.    SOCIAL HISTORY:   the patient is a retired Architect. Her husband on is also retired. He used to work for [employer]. Daughter [name] lives in [place] and she is a Wellsite geologist, currently not employed. Daughter [name] lives in [place] and has a Brewing technologist in social work but is not currently working. Son [name] died at age 70 from causes not clear after autopsy. He had had significant neurologic damage following an accident . She has one grandchild.    ADVANCED DIRECTIVES:  not in place    HEALTH MAINTENANCE: Social History   Tobacco Use  . Smoking status: Former Smoker    Packs/day: 0.50    Years: 35.00    Pack years: 17.50    Types: Cigarettes    Quit date: 05/22/2018    Years since quitting: 0.4  . Smokeless tobacco: Never Used  Substance Use Topics  . Alcohol use: Yes    Alcohol/week: 9.0 standard drinks    Types: 9 Glasses of wine per week    Comment: 1 drink nightly, 2 on weekends  . Drug use: No     Colonoscopy: 2015/[facility]   PAP:  Bone density: 2016  Lipid panel:  Allergies  Allergen Reactions  . Ampicillin Hives and Other (See  Comments)    Severe reaction in February 2017 1.5 month to have hives to go away  . Penicillins Other (See Comments)    Severe reaction in February 2017 1.5 month to have hives to go away Has patient had a PCN reaction causing immediate rash, facial/tongue/throat swelling, SOB or lightheadedness with hypotension: #  #  #  YES  #  #  #  Has patient had a PCN reaction causing severe rash involving mucus  membranes or skin necrosis: No Has patient had a PCN reaction that required hospitalization No Has patient had a PCN reaction occurring within the last 10 years: No   . Morphine And Related Itching    Patient states she is not allergic to morphine  . Other Itching    UNSPECIFIED Analgesics  . Codeine Nausea And Vomiting  . Hydrocodone-Acetaminophen Itching  . Hydromorphone Itching    Pt has taken w/o issues    Current Outpatient Medications  Medication Sig Dispense Refill  . albuterol (PROVENTIL HFA;VENTOLIN HFA) 108 (90 Base) MCG/ACT inhaler INHALE 2 PUFFS BY MOUTH EVERY 4 TO 6 HOURS AS NEEDED (Patient taking differently: Inhale 2 puffs into the lungs every 6 (six) hours as needed for wheezing. ) 42.5 g 0  . allopurinol (ZYLOPRIM) 100 MG tablet TAKE 2 TABLETS(200 MG) BY MOUTH DAILY (Patient taking differently: Take 200 mg by mouth daily. ) 180 tablet 0  . anastrozole (ARIMIDEX) 1 MG tablet TAKE 1 TABLET(1 MG) BY MOUTH DAILY (Patient taking differently: Take 1 mg by mouth daily. ) 90 tablet 0  . atorvastatin (LIPITOR) 20 MG tablet TAKE 1 TABLET BY MOUTH EVERY DAY (Patient taking differently: Take 20 mg by mouth daily. ) 90 tablet 0  . baclofen (LIORESAL) 10 MG tablet Take 1 tablet (10 mg) once daily as needed. (Patient taking differently: Take 10 mg by mouth as needed. ) 90 tablet 0  . Biotin (BIOTIN 5000) 5 MG CAPS Take 5 mg by mouth daily.     . busPIRone (BUSPAR) 15 MG tablet Take 15 mg by mouth 3 (three) times daily.     . Calcium Carbonate-Vitamin D (CALCIUM 500/D PO) Take 2 each by  mouth daily.     . colchicine 0.6 MG tablet Take 2 tablets now and 1 tablet an hour later for flare up (Patient taking differently: Take 0.6 mg by mouth daily as needed (for gout flare up). ) 6 tablet 0  . Eszopiclone (ESZOPICLONE) 3 MG TABS Take 3 mg by mouth at bedtime. Take immediately before bedtime    . fluticasone furoate-vilanterol (BREO ELLIPTA) 100-25 MCG/INH AEPB Inhale 1 puff into the lungs daily. (Patient not taking: Reported on 11/08/2018) 1 each 3  . furosemide (LASIX) 20 MG tablet Take 1 tablet (20 mg total) by mouth daily as needed. Reported on 10/28/2015 (Patient taking differently: Take 20 mg by mouth daily. ) 90 tablet 1  . hydrOXYzine (VISTARIL) 25 MG capsule Take 25 mg by mouth 2 (two) times daily as needed for anxiety.    Marland Kitchen levothyroxine (SYNTHROID) 75 MCG tablet TAKE 1 TABLET(75 MCG) BY MOUTH DAILY BEFORE BREAKFAST (Patient taking differently: Take 75 mcg by mouth daily before breakfast. ) 90 tablet 0  . LINZESS 145 MCG CAPS capsule TAKE 1 CAPSULE BY MOUTH EVERY DAY BEFORE BREAKFAST (Patient taking differently: Take 145 mcg by mouth as needed (constipation). ) 90 capsule 1  . Magnesium 500 MG CAPS Take 500 mg by mouth daily.    . Melatonin 3 MG CAPS Take 9 mg by mouth at bedtime.    . metoprolol succinate (TOPROL-XL) 25 MG 24 hr tablet TAKE 1/2 TABLET(12.5 MG) BY MOUTH DAILY (Patient taking differently: Take 12.5 mg by mouth at bedtime. ) 45 tablet 2  . olmesartan-hydrochlorothiazide (BENICAR HCT) 40-12.5 MG tablet TAKE 1 TABLET BY MOUTH DAILY 90 tablet 0  . polyethylene glycol powder (MIRALAX) powder Take 17 g by mouth daily as needed for moderate constipation.     . primidone (MYSOLINE)  50 MG tablet TAKE 1 TABLET(50 MG) BY MOUTH AT BEDTIME (Patient taking differently: Take 50 mg by mouth at bedtime. ) 90 tablet 0  . promethazine (PHENERGAN) 12.5 MG tablet Take 1 tablet (12.5 mg total) by mouth every 8 (eight) hours as needed for nausea or vomiting. 20 tablet 0  . rOPINIRole  (REQUIP) 0.5 MG tablet Take 1.5 tablets (0.75 mg total) by mouth at bedtime. (Patient taking differently: Take 0.5 mg by mouth at bedtime. ) 140 tablet 3  . traMADol (ULTRAM) 50 MG tablet Take 1 tablet (50 mg total) by mouth every 6 (six) hours as needed for moderate pain. (Patient not taking: Reported on 11/07/2018) 15 tablet 0  . vitamin B-12 (CYANOCOBALAMIN) 1000 MCG tablet Take 1,000 mcg by mouth daily.     No current facility-administered medications for this visit.     OBJECTIVE: Middle-aged white Stephens who appears stated age  2:   11/13/18 1450  BP: 125/84  Pulse: 66  Resp: 17  Temp: 98.3 F (36.8 C)  SpO2: 98%     Body mass index is 37.1 kg/m.    ECOG FS:1 - Symptomatic but completely ambulatory Filed Weights   11/13/18 1450  Weight: 244 lb (110.7 kg)   Sclerae unicteric, EOMs intact Wearing a mask No cervical or supraclavicular adenopathy Lungs no rales or rhonchi Heart regular rate and rhythm Abd soft, obese, nontender, positive bowel sounds MSK no focal spinal tenderness, no upper extremity lymphedema Neuro: nonfocal, well oriented, appropriate affect Breasts: Right breast is unremarkable.  On the left side she has undergone lumpectomy followed by radiation.  There is no evidence of local recurrence.  Both axillae are benign.  LAB RESULTS:  CMP     Component Value Date/Time   NA 142 11/13/2018 1348   NA 138 11/09/2018 1050   NA 142 06/28/2016 1458   K 5.3 (H) 11/13/2018 1348   K 5.2 (H) 06/28/2016 1458   CL 108 11/13/2018 1348   CO2 23 11/13/2018 1348   CO2 24 06/28/2016 1458   GLUCOSE 132 (H) 11/13/2018 1348   GLUCOSE 114 06/28/2016 1458   BUN 52 (H) 11/13/2018 1348   BUN 33 (H) 11/09/2018 1050   BUN 23.6 06/28/2016 1458   CREATININE 2.43 (H) 11/13/2018 1348   CREATININE 0.97 07/08/2016 1042   CREATININE 1.0 06/28/2016 1458   CALCIUM 9.6 11/13/2018 1348   CALCIUM 10.3 06/28/2016 1458   PROT 7.0 11/13/2018 1348   PROT 6.6 11/07/2018 1520   PROT  6.8 06/28/2016 1458   ALBUMIN 3.9 11/13/2018 1348   ALBUMIN 4.5 11/07/2018 1520   ALBUMIN 3.6 06/28/2016 1458   AST 16 11/13/2018 1348   AST 18 06/28/2016 1458   ALT 19 11/13/2018 1348   ALT 22 06/28/2016 1458   ALKPHOS 98 11/13/2018 1348   ALKPHOS 101 06/28/2016 1458   BILITOT 0.2 (L) 11/13/2018 1348   BILITOT 0.3 11/07/2018 1520   BILITOT 0.48 06/28/2016 1458   GFRNONAA 19 (L) 11/13/2018 1348   GFRAA 22 (L) 11/13/2018 1348    INo results found for: SPEP, UPEP  Lab Results  Component Value Date   WBC 9.1 11/13/2018   NEUTROABS 6.0 11/13/2018   HGB 11.6 (L) 11/13/2018   HCT 36.2 11/13/2018   MCV 98.4 11/13/2018   PLT 237 11/13/2018      Chemistry      Component Value Date/Time   NA 142 11/13/2018 1348   NA 138 11/09/2018 1050   NA 142 06/28/2016 1458  K 5.3 (H) 11/13/2018 1348   K 5.2 (H) 06/28/2016 1458   CL 108 11/13/2018 1348   CO2 23 11/13/2018 1348   CO2 24 06/28/2016 1458   BUN 52 (H) 11/13/2018 1348   BUN 33 (H) 11/09/2018 1050   BUN 23.6 06/28/2016 1458   CREATININE 2.43 (H) 11/13/2018 1348   CREATININE 0.97 07/08/2016 1042   CREATININE 1.0 06/28/2016 1458      Component Value Date/Time   CALCIUM 9.6 11/13/2018 1348   CALCIUM 10.3 06/28/2016 1458   ALKPHOS 98 11/13/2018 1348   ALKPHOS 101 06/28/2016 1458   AST 16 11/13/2018 1348   AST 18 06/28/2016 1458   ALT 19 11/13/2018 1348   ALT 22 06/28/2016 1458   BILITOT 0.2 (L) 11/13/2018 1348   BILITOT 0.3 11/07/2018 1520   BILITOT 0.48 06/28/2016 1458       No results found for: LABCA2  No components found for: LABCA125  No results for input(s): INR in the last 168 hours.  Urinalysis    Component Value Date/Time   COLORURINE YELLOW 05/15/2015 1218   APPEARANCEUR CLEAR 05/15/2015 1218   LABSPEC 1.025 11/30/2017 1510   PHURINE 5.0 05/15/2015 [place] 05/15/2015 [place] 05/15/2015 1218   BILIRUBINUR negative 11/30/2017 1510   BILIRUBINUR n 11/25/2015 1712    KETONESUR negative 11/30/2017 [place] 05/15/2015 1218   PROTEINUR negative 11/30/2017 1510   PROTEINUR n 11/25/2015 1712   PROTEINUR NEGATIVE 05/15/2015 1218   UROBILINOGEN negative 11/25/2015 1712   NITRITE Negative 11/30/2017 1510   NITRITE n 11/25/2015 1712   NITRITE NEGATIVE 05/15/2015 1218   LEUKOCYTESUR Negative 11/30/2017 1510      ELIGIBLE FOR AVAILABLE RESEARCH PROTOCOL: no  STUDIES: Since her last visit, she underwent diagnostic bilateral mammography with CAD and tomography on 08/21/2017 at [facility] showing: breast density category C. There was no evidence of malignancy.    ASSESSMENT: 81 y.o. Brandi Stephens  Status post left breast upper inner quadrant biopsy 07/13/2015 for a clinical [place] N0 invasive ductal carcinoma, grade 1, estrogen and progesterone receptor positive, HER-2 nonamplified, with an MIB-1 of 3%.  (a) biopsy of a suspicious lesion in the right breast proved to be a small fibroadenoma  (1) left lumpectomy and sentinel lymph node sampling 09/16/2015 showed a pT1a pN0 invasive ductal carcinoma, grade 1, with negative margins, stage IA    (2) adjuvant radiation completed 11/18/2015  (3) anastrozole started 12/14/2015.  (a) bone density on 08/16/2016 showed a T score of of -2.0 osteopenia -left hip improved from prior 2002  PLAN: [name] is now a little over 2 years out from definitive surgery for her breast cancer with no evidence of disease recurrence.  This is very favorable.  She is a bit behind on her mammography because of the current pandemic.  We will see if she is able to obtain mammography within the next month or 2.  That order has been placed.  She is planning anastrozole for numbness and tingling in her feet and hands, swelling in her feet, insomnia, water retention, and other issues which I do not think are related.  She is also having hot flashes, some joint and arthralgia pains which may indeed be related.  The only  way to sort this out is to stop the anastrozole, which we are doing at this point.  She will reassess her symptoms in early October and I will see her late October to discuss.  At that point we can decide whether to go back to anastrozole or consider a different antiestrogen.  Her renal function has significantly declined.  She has been taking a lot of nonsteroidals because of her back pain.  I asked her to stop those, make her self well-hydrated, and use only Tylenol or tramadol for pain.  I asked her to contact her primary doctor to get her renal labs rechecked in a couple of weeks.  If they are not significantly better perhaps she will need a nephrology referral.  She is taking appropriate pandemic precautions.  She knows to call for any other issue that may develop before the next visit.   Hutch Rhett, Virgie Dad, MD  11/13/18 3:35 PM Medical Oncology and Hematology [facility] [address] [place], [place] [address] Tel. [phone]    Fax. [phone]   I, Wilburn Mylar, am acting as scribe for Dr. Virgie Dad. Laquenta Whitsell.  I, Lurline Del MD, have reviewed the above documentation for accuracy and completeness, and I agree with the above.

## 2018-11-14 ENCOUNTER — Telehealth: Payer: Self-pay | Admitting: Oncology

## 2018-11-14 NOTE — Telephone Encounter (Signed)
I talk with patient regarding schedule  

## 2018-11-18 NOTE — Progress Notes (Signed)
Please schedule her to come in for recheck of labs and for office visit in 2 weeks due to abnormal kidney function. Have her come in well hydrated.   Thank you Dr. Jana Hakim for bringing this to my attention.

## 2018-12-03 ENCOUNTER — Ambulatory Visit: Payer: Self-pay | Admitting: Family Medicine

## 2018-12-06 ENCOUNTER — Ambulatory Visit
Admission: RE | Admit: 2018-12-06 | Discharge: 2018-12-06 | Disposition: A | Discharge status: Home / Self Care | Payer: PPO | Source: Ambulatory Visit | Attending: Oncology | Admitting: Oncology

## 2018-12-06 ENCOUNTER — Other Ambulatory Visit: Payer: Self-pay

## 2018-12-06 DIAGNOSIS — I700 Atherosclerosis of aorta: Secondary | ICD-10-CM

## 2018-12-06 DIAGNOSIS — G479 Sleep disorder, unspecified: Secondary | ICD-10-CM

## 2018-12-06 DIAGNOSIS — R922 Inconclusive mammogram: Secondary | ICD-10-CM | POA: Diagnosis not present

## 2018-12-06 DIAGNOSIS — C50212 Malignant neoplasm of upper-inner quadrant of left female breast: Secondary | ICD-10-CM

## 2018-12-13 ENCOUNTER — Ambulatory Visit: Payer: PPO | Admitting: Family Medicine

## 2018-12-13 ENCOUNTER — Ambulatory Visit (INDEPENDENT_AMBULATORY_CARE_PROVIDER_SITE_OTHER): Payer: PPO | Admitting: Family Medicine

## 2018-12-13 ENCOUNTER — Telehealth: Payer: Self-pay | Admitting: Family Medicine

## 2018-12-13 ENCOUNTER — Telehealth: Payer: Self-pay

## 2018-12-13 ENCOUNTER — Other Ambulatory Visit: Payer: Self-pay

## 2018-12-13 ENCOUNTER — Encounter: Payer: Self-pay | Admitting: Family Medicine

## 2018-12-13 VITALS — BP 126/82 | HR 95 | Temp 96.9°F | Wt 246.8 lb

## 2018-12-13 DIAGNOSIS — I10 Essential (primary) hypertension: Secondary | ICD-10-CM

## 2018-12-13 DIAGNOSIS — G2581 Restless legs syndrome: Secondary | ICD-10-CM | POA: Diagnosis not present

## 2018-12-13 DIAGNOSIS — R600 Localized edema: Secondary | ICD-10-CM | POA: Diagnosis not present

## 2018-12-13 DIAGNOSIS — Z79899 Other long term (current) drug therapy: Secondary | ICD-10-CM

## 2018-12-13 DIAGNOSIS — G629 Polyneuropathy, unspecified: Secondary | ICD-10-CM | POA: Diagnosis not present

## 2018-12-13 DIAGNOSIS — M1712 Unilateral primary osteoarthritis, left knee: Secondary | ICD-10-CM

## 2018-12-13 DIAGNOSIS — M1000 Idiopathic gout, unspecified site: Secondary | ICD-10-CM

## 2018-12-13 DIAGNOSIS — N189 Chronic kidney disease, unspecified: Secondary | ICD-10-CM

## 2018-12-13 LAB — CBC WITH DIFFERENTIAL/PLATELET
Basophils Absolute: 0 10*3/uL (ref 0.0–0.2)
Basos: 0 %
EOS (ABSOLUTE): 0.1 10*3/uL (ref 0.0–0.4)
Eos: 1 %
Hematocrit: 35 % (ref 34.0–46.6)
Hemoglobin: 11.6 g/dL (ref 11.1–15.9)
Lymphocytes Absolute: 1.6 10*3/uL (ref 0.7–3.1)
Lymphs: 25 %
MCH: 31.7 pg (ref 26.6–33.0)
MCHC: 33.1 g/dL (ref 31.5–35.7)
MCV: 96 fL (ref 79–97)
Monocytes Absolute: 0.2 10*3/uL (ref 0.1–0.9)
Monocytes: 3 %
Neutrophils Absolute: 4.6 10*3/uL (ref 1.4–7.0)
Neutrophils: 71 %
Platelets: 188 10*3/uL (ref 150–450)
RBC: 3.66 x10E6/uL — ABNORMAL LOW (ref 3.77–5.28)
RDW: 14 % (ref 11.7–15.4)
WBC: 6.5 10*3/uL (ref 3.4–10.8)

## 2018-12-13 LAB — COMPREHENSIVE METABOLIC PANEL
ALT: 21 IU/L (ref 0–32)
AST: 18 IU/L (ref 0–40)
Albumin/Globulin Ratio: 2.4 — ABNORMAL HIGH (ref 1.2–2.2)
Albumin: 4.4 g/dL (ref 3.7–4.7)
Alkaline Phosphatase: 92 IU/L (ref 39–117)
BUN/Creatinine Ratio: 25 (ref 12–28)
BUN: 25 mg/dL (ref 8–27)
Bilirubin Total: 0.3 mg/dL (ref 0.0–1.2)
CO2: 23 mmol/L (ref 20–29)
Calcium: 9.7 mg/dL (ref 8.7–10.3)
Chloride: 109 mmol/L — ABNORMAL HIGH (ref 96–106)
Creatinine, Ser: 1 mg/dL (ref 0.57–1.00)
GFR calc Af Amer: 65 mL/min/{1.73_m2} (ref >59)
GFR calc non Af Amer: 56 mL/min/{1.73_m2} — ABNORMAL LOW (ref >59)
Globulin, Total: 1.8 g/dL (ref 1.5–4.5)
Glucose: 152 mg/dL — ABNORMAL HIGH (ref 65–99)
Potassium: 4.8 mmol/L (ref 3.5–5.2)
Sodium: 141 mmol/L (ref 134–144)
Total Protein: 6.2 g/dL (ref 6.0–8.5)

## 2018-12-13 LAB — POCT URINALYSIS DIP (PROADVANTAGE DEVICE)
Bilirubin, UA: NEGATIVE
Blood, UA: NEGATIVE
Glucose, UA: NEGATIVE mg/dL
Ketones, POC UA: NEGATIVE mg/dL
Nitrite, UA: NEGATIVE
Protein Ur, POC: NEGATIVE mg/dL
Specific Gravity, Urine: 1.025
Urobilinogen, Ur: 0.2
pH, UA: 5.5 (ref 5.0–8.0)

## 2018-12-13 MED ORDER — DICLOFENAC SODIUM 1 % TD GEL: 4.0000 g | Freq: Four times a day (QID) | TRANSDERMAL | 0 refills | Status: DC

## 2018-12-13 MED ORDER — DULOXETINE HCL 30 MG PO CPEP: 30.0000 mg | ORAL_CAPSULE | Freq: Every day | ORAL | 0 refills | Status: DC

## 2018-12-13 NOTE — Telephone Encounter (Signed)
Will send it in.  

## 2018-12-13 NOTE — Telephone Encounter (Signed)
Pt would like to try gel . Please advise [facility]

## 2018-12-13 NOTE — Telephone Encounter (Signed)
Spoke to pt advised that Loletha Carrow was going to send in cymbalta . [place]

## 2018-12-13 NOTE — Telephone Encounter (Signed)
It is called Voltaren gel. It is prescription only. I know she stopped all of her anti-inflammatories and this is an anti-inflammatory but since it is topical, less is absorbed systemically. She can check with her orthopedist or I can send it in to see if she can get some relief from her knee pain.

## 2018-12-13 NOTE — Progress Notes (Signed)
Subjective:    Patient ID: Brandi Stephens, female    DOB: [birth year], 73 y.o.   MRN: [id number]  HPI Chief Complaint  Patient presents with  . other    edema both legs, feet and both wrist started back in june    Here with complaints of bilateral LE edema and neuropathy. Denies fever, chills, dizziness, chest pain, palpitations, shortness of breath, orthopnea, cough, abdominal pain, N/V/D.   Recently evaluated and sent to ED for hyperkalemia. Her renal function was also worsening.  She saw Dr. Jana Hakim, oncologist, and he stopped her potassium supplements and NSAIDs.   She stopped Lasix on July 28th on her own. States she was not sure if she should continue this medication. Did not ask anyone/   Stopped no salt supplement the end of July    Reports having "pins and needles" senstion in both lower legs and feet. Has taken gabapentin in the past but this was stopped due to side effects including le edema.   She does have a hx of RLS and has been taking Requip  Complains of severe knee pain since stopping her NSAIDs. Having difficulty walking now. States she knows she cannot take her usual pain medication due to her kidney function. Requests a new plan for bilateral knee pain.   Gout flare up last week in right great toe. She is taking allopurinol 300 mg daily. States she took her husband's cochicine   She does not take Tramadol and has not had it in several months.      Review of Systems Pertinent positives and negatives in the history of present illness.     Objective:   Physical Exam Constitutional:      General: She is not in acute distress.    Appearance: Normal appearance. She is not ill-appearing.  Eyes:     Conjunctiva/sclera: Conjunctivae normal.  Neck:     Musculoskeletal: Normal range of motion and neck supple.  Cardiovascular:     Rate and Rhythm: Normal rate and regular rhythm.     Pulses: Normal pulses.     Heart sounds: Normal heart sounds.   Comments: Non pitting bilateral LE edema  Pulmonary:     Effort: Pulmonary effort is normal.     Breath sounds: Normal breath sounds.  Musculoskeletal:     Right lower leg: She exhibits no tenderness. Edema present.     Left lower leg: She exhibits no tenderness. Edema present.     Right foot: Normal range of motion and normal capillary refill. Swelling present. No tenderness.     Left foot: Normal range of motion and normal capillary refill. Swelling present. No tenderness.     Comments: No calf asymmetry. Negative Homan's  Skin:    General: Skin is warm and dry.     Capillary Refill: Capillary refill takes less than 2 seconds.  Neurological:     General: No focal deficit present.     Mental Status: She is alert and oriented to person, place, and time.     Cranial Nerves: No cranial nerve deficit.     Sensory: No sensory deficit.     Motor: No weakness.     Coordination: Coordination normal.     Gait: Gait normal.  Psychiatric:        Attention and Perception: Attention and perception normal.        Mood and Affect: Mood normal.        Speech: Speech normal.  Behavior: Behavior normal.        Thought Content: Thought content normal.        Cognition and Memory: Cognition normal.    BP 126/82 (BP Location: Right Arm, Patient Position: Sitting)   Pulse 95   Temp (!) 96.9 F (36.1 C)   Wt 246 lb 12.8 oz (111.9 kg)   SpO2 97%   BMI 37.53 kg/m       Assessment & Plan:  Leg edema - Plan: Comprehensive metabolic panel, CBC with Differential/Platelet- bilateral. no sign of DVT or obstruction. start back on Lasix. Unclear as to why she stopped this.   Chronic kidney disease, unspecified CKD stage - Plan: Comprehensive metabolic panel, CBC with Differential/Platelet, POCT Urinalysis DIP (Proadvantage Device)- worsening renal function. Will need close follow up and referral to nephrology if this is not improving.   RLS (restless legs syndrome)- currently taking Requip   Arthritis of left knee - Plan: diclofenac sodium (VOLTAREN) 1 % GEL follow up with orthopedist. Her oncologist stopped oral NSAIDs due to renal function and edema. I agree with this decision.   Idiopathic gout, unspecified chronicity, unspecified site - Plan: Uric acid- gout flare up last month.   Essential hypertension - Plan: Comprehensive metabolic panel, CBC with Differential/Platelet- currently well controlled on current medication and low sodium diet   Medication management - Plan: Uric acid  Neuropathy - Plan: DULoxetine (CYMBALTA) 30 MG capsule- she has taken gabapentin in the past and had side effects including edema. She has never tried Cymbalta as far as she knows. Reviewed medications and no obvious interaction. Discussed potential side effects including psychological and will follow up with her in 4 weeks.

## 2018-12-13 NOTE — Telephone Encounter (Signed)
Pt called and could remember what was the name of the topical cream that was dicussed at her visit and also was is over the counter or did she need prescription for it.

## 2018-12-14 LAB — URIC ACID: Uric Acid: 7.4 mg/dL — ABNORMAL HIGH (ref 2.5–7.1)

## 2018-12-18 ENCOUNTER — Other Ambulatory Visit: Payer: Self-pay | Admitting: Internal Medicine

## 2018-12-20 ENCOUNTER — Ambulatory Visit (INDEPENDENT_AMBULATORY_CARE_PROVIDER_SITE_OTHER): Payer: PPO | Admitting: Family Medicine

## 2018-12-20 ENCOUNTER — Other Ambulatory Visit: Payer: Self-pay

## 2018-12-20 ENCOUNTER — Encounter: Payer: Self-pay | Admitting: Family Medicine

## 2018-12-20 VITALS — BP 120/80 | Temp 98.0°F | Ht 67.5 in | Wt 245.6 lb

## 2018-12-20 DIAGNOSIS — N189 Chronic kidney disease, unspecified: Secondary | ICD-10-CM

## 2018-12-20 DIAGNOSIS — R600 Localized edema: Secondary | ICD-10-CM

## 2018-12-20 DIAGNOSIS — E039 Hypothyroidism, unspecified: Secondary | ICD-10-CM

## 2018-12-20 DIAGNOSIS — R7303 Prediabetes: Secondary | ICD-10-CM

## 2018-12-20 DIAGNOSIS — Z789 Other specified health status: Secondary | ICD-10-CM | POA: Diagnosis not present

## 2018-12-20 DIAGNOSIS — I10 Essential (primary) hypertension: Secondary | ICD-10-CM

## 2018-12-20 DIAGNOSIS — M1000 Idiopathic gout, unspecified site: Secondary | ICD-10-CM

## 2018-12-20 DIAGNOSIS — E875 Hyperkalemia: Secondary | ICD-10-CM | POA: Diagnosis not present

## 2018-12-20 DIAGNOSIS — E782 Mixed hyperlipidemia: Secondary | ICD-10-CM

## 2018-12-20 DIAGNOSIS — M25569 Pain in unspecified knee: Secondary | ICD-10-CM

## 2018-12-20 DIAGNOSIS — Z23 Encounter for immunization: Secondary | ICD-10-CM

## 2018-12-20 DIAGNOSIS — G8929 Other chronic pain: Secondary | ICD-10-CM

## 2018-12-20 DIAGNOSIS — Z7189 Other specified counseling: Secondary | ICD-10-CM | POA: Diagnosis not present

## 2018-12-20 DIAGNOSIS — G629 Polyneuropathy, unspecified: Secondary | ICD-10-CM | POA: Diagnosis not present

## 2018-12-20 DIAGNOSIS — Z7185 Encounter for immunization safety counseling: Secondary | ICD-10-CM

## 2018-12-20 DIAGNOSIS — Z0000 Encounter for general adult medical examination without abnormal findings: Secondary | ICD-10-CM | POA: Diagnosis not present

## 2018-12-20 MED ORDER — COLCHICINE 0.6 MG PO TABS: ORAL_TABLET | ORAL | 0 refills | Status: DC

## 2018-12-20 MED ORDER — ALLOPURINOL 300 MG PO TABS: 300.0000 mg | ORAL_TABLET | Freq: Every day | ORAL | 0 refills | Status: DC

## 2018-12-20 NOTE — Patient Instructions (Addendum)
  Brandi Stephens , Thank you for taking time to come for your Medicare Wellness Visit. I appreciate your ongoing commitment to your health goals. Please review the following plan we discussed and let me know if I can assist you in the future.   You may return for your 2nd Shingrix vaccine at your convenience.   Let's see how you do with the Cymbalta. If you continue having nausea and do not notice improvement in your nerve pain, we may need to consider stopping the medication.     This is a list of the screening recommended for you and due dates:  Health Maintenance  Topic Date Due  . Flu Shot  11/17/2018  . Mammogram  12/05/2020  . Colon Cancer Screening  01/05/2021  . Tetanus Vaccine  07/03/2023  . DEXA scan (bone density measurement)  Completed  .  Hepatitis C: One time screening is recommended by Center for Disease Control  (CDC) for  adults born from [birth year] through 1964.   Completed  . Pneumonia vaccines  Completed

## 2018-12-20 NOTE — Progress Notes (Signed)
Brandi Stephens is a 73 y.o. female who presents for annual wellness visit and follow-up on chronic medical conditions, CPE and follow up on chronic health conditions.   She has the following concerns:  She continues having pins and needle sensation in hands and feet. Started on Cymbalta last week and has had nausea with this medication. States nausea is gradually improving and she would like to stay on this. She cannot tell if neuropathy is improving yet.   Now on Lasix again and LE edema has resolved. Taking potassium supplement but currently on 5 MEQ every other day. She recently had hyperkalemia and was in the ED for this.   Reports knee pain improve with Voltaren gel.   Keeping an eye on renal function. Recently her labs showed significant improvement.   Followed closely by Dr. Jana Hakim for breat cancer.    Immunization History  Administered Date(s) Administered  . Fluad Quad(high Dose 65+) 12/20/2018  . Influenza, High Dose Seasonal PF 05/01/2017  . Influenza-Unspecified 12/17/2013, 12/12/2017  . Pneumococcal Conjugate-13 01/14/2014  . Pneumococcal Polysaccharide-23 07/06/2016  . Tdap 07/02/2013  . Zoster Recombinat (Shingrix) 12/12/2017   Last Pap smear: 2014 and negative  Last mammogram: 2 weeks ago  Last colonoscopy: 2019 Last DEXA: 2018 and osteopenia. Followed by Dr. Jana Hakim  Dentist: Dr. Mervyn Skeeters- has appt in October 2020 Ophtho: Dr. Valetta Close. Overdue and will schedule  Exercise: walking  Other doctors caring for patient include: Dr. Jana Hakim- oncology Tammy Parrett- Pulmonary Dr. Kathyrn Sheriff- Neurosurgery  Mickel Baas- Dermatology  Depression screen:  See questionnaire below.  Depression screen [facility] 2/9 12/20/2018 11/30/2017 07/06/2016 01/06/2016 06/08/2015  Decreased Interest 0 0 0 0 1  Down, Depressed, Hopeless 0 0 0 0 0  PHQ - 2 Score 0 0 0 0 1  Some recent data might be hidden    Fall Risk Screen: see questionnaire below. Fall Risk  12/20/2018 04/20/2018 11/30/2017 09/09/2016  07/06/2016  Falls in the past year? 0 1 Yes No No  Number falls in past yr: 0 1 2 or more - -  Injury with Fall? 0 0 Yes - -  Risk for fall due to : - Impaired balance/gait;Impaired mobility - - -  Follow up - Falls evaluation completed;Education provided;Falls prevention discussed - - -    ADL screen:  See questionnaire below Functional Status Survey: Is the patient deaf or have difficulty hearing?: No Does the patient have difficulty seeing, even when wearing glasses/contacts?: No Does the patient have difficulty concentrating, remembering, or making decisions?: No Does the patient have difficulty walking or climbing stairs?: Yes(due knee pain and back) Does the patient have difficulty dressing or bathing?: No Does the patient have difficulty doing errands alone such as visiting a doctor's office or shopping?: No   End of Life Discussion:  Patient has a living will and medical power of attorney. These were reviewed and signed.   Review of Systems Constitutional: -fever, -chills, -sweats, -unexpected weight change, -anorexia, -fatigue Allergy: -sneezing, -itching, -congestion Dermatology: denies changing moles, rash, lumps, new worrisome lesions ENT: -runny nose, -ear pain, -sore throat, -hoarseness, -sinus pain, -teeth pain, -tinnitus, -hearing loss, -epistaxis Cardiology:  -chest pain, -palpitations, -edema, -orthopnea, -paroxysmal nocturnal dyspnea Respiratory: -cough, -shortness of breath, -dyspnea on exertion, -wheezing, -hemoptysis Gastroenterology: -abdominal pain, +nausea, -vomiting, -diarrhea, -constipation, -blood in stool, -changes in bowel movement, -dysphagia Hematology: -bleeding or bruising problems Musculoskeletal: -arthralgias, -myalgias, -joint swelling, + chronic back pain, -neck pain, -cramping, -gait changes Ophthalmology: -vision changes, -eye redness, -itching, -discharge Urology: -dysuria, -  difficulty urinating, -hematuria, -urinary frequency, -urgency,  incontinence Neurology: -headache, -weakness, +tingling, -numbness, -speech abnormality, -memory loss, -falls, -dizziness Psychology:  -depressed mood, -agitation, -sleep problems    PHYSICAL EXAM:  BP 120/80   Temp 98 F (36.7 C)   Ht 5' 7.5" (1.715 m)   Wt 245 lb 9.6 oz (111.4 kg)   BMI 37.90 kg/m   General Appearance: Alert, cooperative, no distress, appears stated age Head: Normocephalic, without obvious abnormality, atraumatic Eyes: PERRL, conjunctiva/corneas clear, EOM's intact Ears: Normal TM's and external ear canals Nose:  Mask in place  Throat:  Mask in place  Neck: Supple, no lymphadenopathy; thyroid: no enlargement/tenderness/nodules; no carotid bruit or JVD Back: Spine tender- chronic back pain, no curvature, ROM limited (chronic), no CVA tenderness Lungs: Clear to auscultation bilaterally without wheezes, rales or ronchi; respirations unlabored Chest Wall: No tenderness or deformity Heart: Regular rate and rhythm, S1 and S2 normal, no murmur, rub or gallop Breast Exam: declines. Mammogram 2 weeks ago Abdomen: Soft, non-tender, nondistended, normoactive bowel sounds Genitalia: refuses  Extremities: No clubbing, cyanosis or edema Pulses: 2+ and symmetric all extremities Skin: Skin color, texture, turgor normal, no rashes or lesions Lymph nodes: Cervical, supraclavicular, and axillary nodes normal Neurologic: CNII-XII intact, normal strength, sensation and gait; reflexes 2+ and symmetric throughout Psych: Normal mood, affect, hygiene and grooming.  ASSESSMENT/PLAN: Medicare annual wellness visit, subsequent -No issues with falls, memory, mood and no difficulty with ADLs. Advanced directives updated.  Her care team was reviewed.  Medications were reviewed  Routine general medical examination at a health care facility - Plan: Comprehensive metabolic panel, TSH, T4, free, Lipid panel.  Reviewed preventive health care.  Up-to-date on mammogram and colonoscopy.  Last  Pap smear 6 years ago negative.  She does not want a Pap smear today.  Immunizations reviewed.  We discussed safety.  She appears to be functioning quite well and is able to take care of herself and husband.  Her mood is good.  Need for influenza vaccination - Plan: Flu Vaccine QUAD High Dose(Fluad)  Hypothyroidism, unspecified type - Plan: TSH, T4, free  Essential hypertension - Plan: Comprehensive metabolic panel  Leg edema  Mixed hyperlipidemia - Plan: Lipid panel  Neuropathy-start her on Cymbalta last week.  She has had nausea with this medication.  No significant improvement in symptoms yet.  We will see how she does over the next week or 2.  Consider stopping if she is not noticing improvement in neuropathy or if she continues to have nausea.  Chronic kidney disease, unspecified CKD stage - Plan: Comprehensive metabolic panel-kidney function with significant improvement as of last week.  She is now back on Lasix for lower extremity edema.  Continue to monitor and refer to nephrology if needed.  Prediabetes - Plan: Hemoglobin A1c-counseling on healthy diet and increasing physical activity as tolerated.  Follow-up pending results  Gout-recent uric acid 7.4% with an acute flare 2 weeks ago.  We will increase her allopurinol to 300 mg and refill colchicine in case she has another flareup.  Advance directive on file-these were reviewed and signed today.  Immunization counseling-flu shot received today.  She will return in 2 weeks for her second Shingrix vaccine.  Chronic knee pain, unspecified laterality-reports Voltaren gel has improved her pain.  She may continue using this.     Discussed monthly self breast exams and yearly mammograms; at least 30 minutes of aerobic activity at least 5 days/week and weight-bearing exercise 2x/week; proper sunscreen use reviewed; healthy diet,  including goals of calcium and vitamin D intake and alcohol recommendations (less than or equal to 1  drink/day) reviewed; regular seatbelt use; changing batteries in smoke detectors.  Immunization recommendations discussed.  Colonoscopy recommendations reviewed   Medicare Attestation I have personally reviewed: The patient's medical and social history Their use of alcohol, tobacco or illicit drugs Their current medications and supplements The patient's functional ability including ADLs,fall risks, home safety risks, cognitive, and hearing and visual impairment Diet and physical activities Evidence for depression or mood disorders  The patient's weight, height, and BMI have been recorded in the chart.  I have made referrals, counseling, and provided education to the patient based on review of the above and I have provided the patient with a written personalized care plan for preventive services.     Harland Dingwall, NP-C   12/20/2018

## 2018-12-21 LAB — COMPREHENSIVE METABOLIC PANEL
ALT: 25 IU/L (ref 0–32)
AST: 21 IU/L (ref 0–40)
Albumin/Globulin Ratio: 2.4 — ABNORMAL HIGH (ref 1.2–2.2)
Albumin: 4.5 g/dL (ref 3.7–4.7)
Alkaline Phosphatase: 93 IU/L (ref 39–117)
BUN/Creatinine Ratio: 29 — ABNORMAL HIGH (ref 12–28)
BUN: 43 mg/dL — ABNORMAL HIGH (ref 8–27)
Bilirubin Total: 0.3 mg/dL (ref 0.0–1.2)
CO2: 25 mmol/L (ref 20–29)
Calcium: 9.6 mg/dL (ref 8.7–10.3)
Chloride: 100 mmol/L (ref 96–106)
Creatinine, Ser: 1.5 mg/dL — ABNORMAL HIGH (ref 0.57–1.00)
GFR calc Af Amer: 40 mL/min/{1.73_m2} — ABNORMAL LOW (ref >59)
GFR calc non Af Amer: 35 mL/min/{1.73_m2} — ABNORMAL LOW (ref >59)
Globulin, Total: 1.9 g/dL (ref 1.5–4.5)
Glucose: 116 mg/dL — ABNORMAL HIGH (ref 65–99)
Potassium: 5.2 mmol/L (ref 3.5–5.2)
Sodium: 139 mmol/L (ref 134–144)
Total Protein: 6.4 g/dL (ref 6.0–8.5)

## 2018-12-21 LAB — LIPID PANEL
Chol/HDL Ratio: 3.5 ratio (ref 0.0–4.4)
Cholesterol, Total: 201 mg/dL — ABNORMAL HIGH (ref 100–199)
HDL: 58 mg/dL (ref >39)
LDL Chol Calc (NIH): 90 mg/dL (ref 0–99)
Triglycerides: 325 mg/dL — ABNORMAL HIGH (ref 0–149)
VLDL Cholesterol Cal: 53 mg/dL — ABNORMAL HIGH (ref 5–40)

## 2018-12-21 LAB — T4, FREE: Free T4: 1.18 ng/dL (ref 0.82–1.77)

## 2018-12-21 LAB — TSH: TSH: 2.94 u[IU]/mL (ref 0.450–4.500)

## 2018-12-21 LAB — HEMOGLOBIN A1C
Est. average glucose Bld gHb Est-mCnc: 126 mg/dL
Hgb A1c MFr Bld: 6 % — ABNORMAL HIGH (ref 4.8–5.6)

## 2018-12-21 NOTE — Addendum Note (Signed)
Addended by: Edgar Frisk on: 12/21/2018 02:14 PM   Modules accepted: Orders

## 2018-12-27 ENCOUNTER — Telehealth: Payer: Self-pay | Admitting: Internal Medicine

## 2018-12-27 ENCOUNTER — Other Ambulatory Visit: Payer: Self-pay | Admitting: Internal Medicine

## 2018-12-27 DIAGNOSIS — E875 Hyperkalemia: Secondary | ICD-10-CM

## 2018-12-27 NOTE — Telephone Encounter (Signed)
Ok to start back on potassium today. Can she come in tomorrow morning for labs and let us recheck it? Please order a stat BMP so I can get it back tomorrow.

## 2018-12-27 NOTE — Telephone Encounter (Signed)
Pt coming in tomorrow for lab

## 2018-12-27 NOTE — Telephone Encounter (Signed)
Pt called and states that she is having severe cramping in her left leg down to her foot. She states you took her off all potassium med. Can this be causing her cramping. Anything she can do?

## 2018-12-28 ENCOUNTER — Other Ambulatory Visit: Payer: PPO

## 2018-12-28 ENCOUNTER — Other Ambulatory Visit: Payer: Self-pay

## 2018-12-28 DIAGNOSIS — E875 Hyperkalemia: Secondary | ICD-10-CM | POA: Diagnosis not present

## 2018-12-28 LAB — BASIC METABOLIC PANEL
BUN/Creatinine Ratio: 29 — ABNORMAL HIGH (ref 12–28)
BUN: 42 mg/dL — ABNORMAL HIGH (ref 8–27)
CO2: 27 mmol/L (ref 20–29)
Calcium: 10.4 mg/dL — ABNORMAL HIGH (ref 8.7–10.3)
Chloride: 106 mmol/L (ref 96–106)
Creatinine, Ser: 1.47 mg/dL — ABNORMAL HIGH (ref 0.57–1.00)
GFR calc Af Amer: 41 mL/min/{1.73_m2} — ABNORMAL LOW (ref >59)
GFR calc non Af Amer: 35 mL/min/{1.73_m2} — ABNORMAL LOW (ref >59)
Glucose: 118 mg/dL — ABNORMAL HIGH (ref 65–99)
Potassium: 4.4 mmol/L (ref 3.5–5.2)
Sodium: 141 mmol/L (ref 134–144)

## 2018-12-31 ENCOUNTER — Other Ambulatory Visit: Payer: Self-pay | Admitting: Internal Medicine

## 2018-12-31 DIAGNOSIS — G629 Polyneuropathy, unspecified: Secondary | ICD-10-CM

## 2018-12-31 DIAGNOSIS — R202 Paresthesia of skin: Secondary | ICD-10-CM

## 2018-12-31 DIAGNOSIS — G250 Essential tremor: Secondary | ICD-10-CM

## 2019-01-03 ENCOUNTER — Other Ambulatory Visit: Payer: PPO

## 2019-01-04 ENCOUNTER — Other Ambulatory Visit: Payer: Self-pay

## 2019-01-04 ENCOUNTER — Other Ambulatory Visit (INDEPENDENT_AMBULATORY_CARE_PROVIDER_SITE_OTHER): Payer: PPO

## 2019-01-04 DIAGNOSIS — Z23 Encounter for immunization: Secondary | ICD-10-CM

## 2019-01-15 ENCOUNTER — Other Ambulatory Visit: Payer: Self-pay | Admitting: Family Medicine

## 2019-01-15 DIAGNOSIS — M1712 Unilateral primary osteoarthritis, left knee: Secondary | ICD-10-CM

## 2019-01-15 NOTE — Telephone Encounter (Signed)
Is this okay to refill? 

## 2019-01-15 NOTE — Telephone Encounter (Signed)
Ok to refill 

## 2019-01-19 ENCOUNTER — Other Ambulatory Visit: Payer: Self-pay | Admitting: Family Medicine

## 2019-01-19 DIAGNOSIS — G629 Polyneuropathy, unspecified: Secondary | ICD-10-CM

## 2019-01-21 ENCOUNTER — Other Ambulatory Visit: Payer: Self-pay | Admitting: Family Medicine

## 2019-01-21 ENCOUNTER — Other Ambulatory Visit: Payer: Self-pay | Admitting: Oncology

## 2019-01-21 NOTE — Telephone Encounter (Signed)
Pt states this is not helping and she does not want this refilled

## 2019-01-21 NOTE — Telephone Encounter (Signed)
Ok to refill if she feels that there is a benefit and not having side effects (until she sees neurology).

## 2019-01-21 NOTE — Telephone Encounter (Signed)
Is this okay to refill?  Pt has neuro appt on 10/27

## 2019-01-22 ENCOUNTER — Other Ambulatory Visit: Payer: Self-pay | Admitting: Family Medicine

## 2019-01-23 NOTE — Telephone Encounter (Signed)
Is this okay to refill? 

## 2019-01-24 ENCOUNTER — Other Ambulatory Visit: Payer: Self-pay

## 2019-01-24 DIAGNOSIS — Z20822 Contact with and (suspected) exposure to covid-19: Secondary | ICD-10-CM

## 2019-01-24 DIAGNOSIS — Z20828 Contact with and (suspected) exposure to other viral communicable diseases: Secondary | ICD-10-CM | POA: Diagnosis not present

## 2019-01-25 LAB — NOVEL CORONAVIRUS, NAA: SARS-CoV-2, NAA: NOT DETECTED

## 2019-01-30 DIAGNOSIS — M48061 Spinal stenosis, lumbar region without neurogenic claudication: Secondary | ICD-10-CM | POA: Diagnosis not present

## 2019-01-31 ENCOUNTER — Other Ambulatory Visit: Payer: Self-pay

## 2019-01-31 ENCOUNTER — Ambulatory Visit (INDEPENDENT_AMBULATORY_CARE_PROVIDER_SITE_OTHER): Payer: PPO | Admitting: Family Medicine

## 2019-01-31 ENCOUNTER — Encounter: Payer: Self-pay | Admitting: Family Medicine

## 2019-01-31 VITALS — BP 126/70 | Temp 97.6°F | Wt 240.0 lb

## 2019-01-31 DIAGNOSIS — J209 Acute bronchitis, unspecified: Secondary | ICD-10-CM | POA: Diagnosis not present

## 2019-01-31 MED ORDER — CIPROFLOXACIN HCL 500 MG PO TABS: 500.0000 mg | ORAL_TABLET | Freq: Two times a day (BID) | ORAL | 0 refills | Status: DC

## 2019-01-31 NOTE — Progress Notes (Signed)
   Subjective:    Patient ID: Brandi Stephens, female    DOB: 1945/12/04, 73 y.o.   MRN: [id number]  HPI Documentation for virtual telephone encounter.  Documentation for virtual audio and video telecommunications through [place] encounter: The patient was located at home. The provider was located in the office. The patient did consent to this visit and is aware of possible charges through their insurance for this visit. The other persons participating in this telemedicine service were none.l. This virtual service is not related to other E/M service within previous 7 days. Approximately 10 days ago she developed rhinorrhea, chest congestion, watery eyes and sneezing but no fever, chills, sore throat, shortness of breath, malaise or fatigue.  She was tested a couple days after that for Covid and was negative.  Does have a previous history of pneumonia.  Does not smoke. She now states that the cough has become more productive.   Review of Systems     Objective:   Physical Exam Alert and in no distress otherwise not examined       Assessment & Plan:  Acute bronchitis, unspecified organism - Plan: ciprofloxacin (CIPRO) 500 MG tablet She is allergic to penicillin.  Recommend fluids, Robitussin-DM during the day and NyQuil at night.  Call when she finishes the antibiotic if not totally back to normal.

## 2019-02-06 ENCOUNTER — Other Ambulatory Visit: Payer: Self-pay | Admitting: Physician Assistant

## 2019-02-06 ENCOUNTER — Telehealth: Payer: Self-pay | Admitting: Family Medicine

## 2019-02-06 DIAGNOSIS — M542 Cervicalgia: Secondary | ICD-10-CM

## 2019-02-06 NOTE — Telephone Encounter (Signed)
Pt was advised to go to Urgent care to be evaluated to determine if she needs longer dose of antibiotics or if she needs steriods. She does have 4 days left of anitbiotics. She started using inhaler today

## 2019-02-06 NOTE — Telephone Encounter (Signed)
Pt called and stated that her symptoms from her last virtual visit with you are the same. She will be out of her antibiotic on Saturday and is still coughing really bad. Pt wants to know if she may need a different antiibotic

## 2019-02-06 NOTE — Telephone Encounter (Signed)
If she is feeling short of breath at all then I would recommend that she go to [facility] urgent care so that they can check her oxygen level and listen to her lungs. Is she using albuterol or does she have this at home? Try to get her in to see her pulmonologist. She saw Rexene Edison, NP in July.

## 2019-02-06 NOTE — Telephone Encounter (Signed)
Deferring to you 

## 2019-02-07 ENCOUNTER — Other Ambulatory Visit: Payer: Self-pay

## 2019-02-07 ENCOUNTER — Ambulatory Visit (HOSPITAL_COMMUNITY)
Admission: EM | Admit: 2019-02-07 | Discharge: 2019-02-07 | Disposition: A | Discharge status: Home / Self Care | Payer: PPO | Attending: Family Medicine | Admitting: Family Medicine

## 2019-02-07 ENCOUNTER — Encounter (HOSPITAL_COMMUNITY): Payer: Self-pay

## 2019-02-07 DIAGNOSIS — J209 Acute bronchitis, unspecified: Secondary | ICD-10-CM

## 2019-02-07 MED ORDER — AZITHROMYCIN 250 MG PO TABS: 250.0000 mg | ORAL_TABLET | Freq: Every day | ORAL | 0 refills | Status: DC

## 2019-02-07 MED ORDER — BENZONATATE 100 MG PO CAPS: 100.0000 mg | ORAL_CAPSULE | Freq: Three times a day (TID) | ORAL | 0 refills | Status: DC

## 2019-02-07 MED ORDER — PREDNISONE 10 MG PO TABS: 40.0000 mg | ORAL_TABLET | Freq: Every day | ORAL | 0 refills | Status: DC

## 2019-02-07 NOTE — ED Triage Notes (Signed)
Patient presents to Urgent Care with complaints of needing a chest x-ray for rule out of pneumonia per her PCP since developing a bad cough 2-3 weeks ago. Patient reports she has had really bad bronchitis in the past, feels similar.

## 2019-02-07 NOTE — Discharge Instructions (Addendum)
Treating you for bronchitis and changing antibiotic to something that covers atypical bacteria's. Take the medication as prescribed Keep using the Mucinex and albuterol inhaler I am also can give you some Tessalon Perles for cough this could help Follow up as needed for continued or worsening symptoms

## 2019-02-08 NOTE — ED Provider Notes (Signed)
[place]    CSN: [id number] Arrival date & time: 02/07/19  1416      History   Chief Complaint Chief Complaint  Patient presents with  . Chest X-Ray    HPI Brandi Stephens is a 73 y.o. female.   Patient is a 73 year old that presents with persistent and worsening cough x2 to 3 weeks.  The cough is productive.  She has had some mild shortness of breath.  History of bronchitis in the past and feels the same.  Currently taking Cipro twice a day.  Not having any relief in symptoms.  She is also been using an albuterol inhaler.  No fever, chills, bodyaches, night sweats.  No recent sick contacts or recent traveling.  ROS per HPI      Past Medical History:  Diagnosis Date  . AIN (anal intraepithelial neoplasia) anal canal   . Anemia    with pregnancy  . Anxiety   . Arthritis    knees, lower back, knees  . Chronic constipation   . Coarse tremors    Essential  . COPD (chronic obstructive pulmonary disease) ([place])    2017 chest CT, history of  . Degenerative lumbar spinal stenosis   . GERD (gastroesophageal reflux disease)    pt denies  . Gout   . Grade I diastolic dysfunction 0000000   Noted ECHO  . Hemorrhoids   . History of adenomatous polyp of colon   . History of basal cell carcinoma (BCC) excision 2015   nose  . History of sepsis 05/14/2014   post lumbar surgery  . History of shingles    x2  . Hyperlipidemia   . Hypertension   . Hypothyroidism   . Irregular heart beat    history of while undergoing radiation  . 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 Neuropathy    Left foot and back of left leg  . Personal  history of radiation therapy    completed 11-18-2015  left breast  . Pneumonia   . PONV (postoperative nausea and vomiting)   . Prediabetes    diet controlled, no med  . Restless leg syndrome   . Seasonal allergies   . Seizure ([place]) 05/2015   due to sepsis, only one time episode  . Wears partial dentures    lower     Patient Active Problem List   Diagnosis Date Noted  . Neuropathy 12/20/2018  . Lumbar spinal stenosis 06/29/2018  . Preoperative clearance 06/04/2018  . Abnormal CT of the chest 06/04/2018  . Smoker 05/17/2018  . History of COPD 05/17/2018  . Hypothyroidism 11/30/2017  . Thoracic aortic atherosclerosis ([place]) 09/14/2016  . Benign essential tremor 09/09/2016  . RLS (restless legs syndrome) 09/09/2016  . H/O adenomatous polyp of colon 07/06/2016  . PVC's (premature ventricular contractions) 03/29/2016  . Preoperative cardiovascular examination 03/29/2016  . Palpitations 01/11/2016  . Shortness of breath 01/11/2016  . Other fatigue 01/11/2016  . Former cigarette smoker 01/11/2016  . Tachycardia 01/11/2016  . Leg edema 01/11/2016  . Chronic cough 10/28/2015  . 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 Malignant neoplasm of upper-inner quadrant of left breast in female, estrogen receptor positive ([place])  07/14/2015  . Prediabetes 07/08/2015  . Chronic kidney disease 07/08/2015  . Dependent edema 06/08/2015  . Sleep disturbance 06/08/2015  . Mixed hyperlipidemia 06/08/2015  . Generalized anxiety disorder 06/08/2015  . Essential hypertension 06/08/2015  . Emphysema lung ([place]) 06/08/2015  . Gout 06/08/2015  . Macrocytosis   . 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 Lumbar spondylosis 03/20/2015  . Paresthesias 04/01/2013    Past Surgical History:  Procedure Laterality Date  . ABDOMINAL HYSTERECTOMY  1985  . BASAL CELL CARCINOMA EXCISION  10/15   nose  . BREAST BIOPSY Right 08/24/2015  . BREAST BIOPSY Right 07/13/2015  . BREAST BIOPSY Left 07/13/2015  .  BREAST EXCISIONAL BIOPSY Left 1972  . BREAST LUMPECTOMY Left   . BREAST LUMPECTOMY WITH RADIOACTIVE SEED AND SENTINEL LYMPH NODE BIOPSY Left 09/16/2015   Procedure: BREAST LUMPECTOMY WITH RADIOACTIVE SEED AND SENTINEL LYMPH NODE BIOPSY;  Surgeon: Stark Klein, MD;  Location: [facility];  Service: General;  Laterality: Left;  . BREAST REDUCTION SURGERY Bilateral 1998  . CATARACT EXTRACTION W/ INTRAOCULAR LENS IMPLANT Left 2016  . [place]   hallo  . COLONOSCOPY  01/2013   polyps  . EYE SURGERY Left 2016  . HAND SURGERY Left 02-19-2002   dr Fredna Dow  @[facility]    repair collateral ligament/ MPJ left thumb  . [place]  2008  . HIGH RESOLUTION ANOSCOPY N/A 02/28/2018   Procedure: HIGH RESOLUTION ANOSCOPY WITH BIOPSY;  Surgeon: Leighton Ruff, MD;  Location: [facility];  Service: General;  Laterality: N/A;  . KNEE ARTHROSCOPY Right 2002  . LUMBAR SPINE SURGERY  03-20-2015   dr Kathyrn Sheriff   fusion L4-5, L5-S1  . MOUTH SURGERY  07/14/2018   2 infected teeth removed  . MULTIPLE TOOTH EXTRACTIONS     with bone grafting lower bottom right and left  . REDUCTION MAMMAPLASTY Bilateral   . RIGHT COLECTOMY  09-10-2003   dr Margot Chimes @[facility]    multiple colon polyps (per path tubular adenoma's, hyperplastic , benign appendix, benign two lymph nodes  . ROTATOR CUFF REPAIR Left 04/2016  . TONSILLECTOMY  1969  . TUBAL LIGATION Bilateral 1982    OB History   No obstetric history on file.      Home Medications    Prior to Admission medications   Medication Sig Start Date End Date Taking? Authorizing Provider  ciprofloxacin (CIPRO) 500 MG tablet Take 1 tablet (500 mg total) by mouth 2 (two) times daily. 01/31/19  Yes [name], MD  albuterol (PROVENTIL HFA;VENTOLIN HFA) 108 (90 Base) MCG/ACT inhaler INHALE 2 PUFFS BY MOUTH EVERY 4 TO 6 HOURS AS NEEDED 05/03/18   Henson, Vickie L, NP-C  allopurinol (ZYLOPRIM) 100 MG tablet TAKE 2 TABLETS(200 MG) BY  MOUTH DAILY Patient not taking: Reported on 01/31/2019 01/21/19   Harland Dingwall L, NP-C  allopurinol (ZYLOPRIM) 300 MG tablet Take 1 tablet (300 mg total) by mouth daily. 12/20/18   Henson, Vickie L, NP-C  anastrozole (ARIMIDEX) 1 MG tablet TAKE 1 TABLET(1 MG) BY MOUTH DAILY 01/21/19   Magrinat, Virgie Dad, MD  atorvastatin (LIPITOR) 20 MG tablet TAKE 1 TABLET BY MOUTH EVERY DAY 01/21/19   Henson, Vickie L, NP-C  azithromycin (ZITHROMAX) 250 MG tablet Take 1 tablet (250 mg total) by mouth daily. Take first 2 tablets together, then 1 every day until finished. 02/07/19   Loura Halt A, NP  baclofen (LIORESAL) 10 MG tablet Take 1 tablet (10 mg) once daily as needed.  Patient taking differently: Take 10 mg by mouth as needed.  08/16/18   Henson, Vickie L, NP-C  benzonatate (TESSALON) 100 MG capsule Take 1 capsule (100 mg total) by mouth every 8 (eight) hours. 02/07/19   Loura Halt A, NP  Biotin (BIOTIN 5000) 5 MG CAPS Take 5 mg by mouth daily.     [provider]  busPIRone (BUSPAR) 15 MG tablet Take 15 mg by mouth 3 (three) times daily.  09/28/17   [provider]  Calcium Carbonate-Vitamin D (CALCIUM 500/D PO) Take 2 each by mouth daily.     [provider]  colchicine 0.6 MG tablet Take 2 tablets now and 1 tablet an hour later for flare up 12/20/18   Henson, Vickie L, NP-C  diclofenac sodium (VOLTAREN) 1 % GEL APPLY 4 GRAMS EXTERNALLY TO THE AFFECTED AREA FOUR TIMES DAILY 01/15/19   Henson, Vickie L, NP-C  DULoxetine (CYMBALTA) 30 MG capsule Take 1 capsule (30 mg total) by mouth daily. Patient not taking: Reported on 01/31/2019 12/13/18   Harland Dingwall L, NP-C  Eszopiclone (ESZOPICLONE) 3 MG TABS Take 3 mg by mouth at bedtime. Take immediately before bedtime    [provider]  fluticasone furoate-vilanterol (BREO ELLIPTA) 100-25 MCG/INH AEPB Inhale 1 puff into the lungs daily. Patient not taking: Reported on 01/31/2019 06/04/18   Parrett, Fonnie Mu, NP  furosemide (LASIX) 20  MG tablet TAKE 1 TABLET BY MOUTH DAILY AS NEEDED 01/23/19   Henson, Vickie L, NP-C  hydrOXYzine (VISTARIL) 25 MG capsule Take 25 mg by mouth 2 (two) times daily as needed for anxiety.    [provider]  levothyroxine (SYNTHROID) 75 MCG tablet TAKE 1 TABLET(75 MCG) BY MOUTH DAILY BEFORE BREAKFAST 01/21/19   Henson, Vickie L, NP-C  LINZESS 145 MCG CAPS capsule TAKE 1 CAPSULE BY MOUTH EVERY DAY BEFORE BREAKFAST Patient not taking: Reported on 01/31/2019 03/08/18   Harland Dingwall L, NP-C  Magnesium 500 MG CAPS Take 500 mg by mouth daily.    [provider]  MEGARED OMEGA-3 KRILL OIL PO Take by mouth.    [provider]  Melatonin 3 MG CAPS Take 9 mg by mouth at bedtime.    [provider]  metoprolol succinate (TOPROL-XL) 25 MG 24 hr tablet TAKE 1/2 TABLET(12.5 MG) BY MOUTH DAILY Patient taking differently: Take 12.5 mg by mouth at bedtime.  06/25/18   Hilty, Nadean Corwin, MD  olmesartan-hydrochlorothiazide (BENICAR HCT) 40-12.5 MG tablet TAKE 1 TABLET BY MOUTH DAILY 01/21/19   Henson, Vickie L, NP-C  polyethylene glycol powder (MIRALAX) powder Take 17 g by mouth daily as needed for moderate constipation.     [provider]  predniSONE (DELTASONE) 10 MG tablet Take 4 tablets (40 mg total) by mouth daily for 5 days. 02/07/19 02/12/19  Orvan July, NP  primidone (MYSOLINE) 50 MG tablet TAKE 1 TABLET(50 MG) BY MOUTH AT BEDTIME 01/21/19   Henson, Vickie L, NP-C  promethazine (PHENERGAN) 12.5 MG tablet Take 1 tablet (12.5 mg total) by mouth every 8 (eight) hours as needed for nausea or vomiting. 08/30/18   Henson, Vickie L, NP-C  rOPINIRole (REQUIP) 0.5 MG tablet Take 1.5 tablets (0.75 mg total) by mouth at bedtime. Patient taking differently: Take 0.5 mg by mouth at bedtime.  04/20/18   Narda Amber K, DO  vitamin B-12 (CYANOCOBALAMIN) 1000 MCG tablet Take 1,000 mcg by mouth daily.    [provider]  potassium chloride (KLOR-CON 10) 10 MEQ tablet Take 1  tablet (10 mEq total) by mouth as needed. Reported on 10/28/2015 Patient taking differently: Take 10 mEq by mouth daily.  12/04/17 11/08/18  Girtha Rm, NP-C    Family History Family History  Problem Relation Age of Onset  . Atrial fibrillation Mother   . Breast cancer Maternal Aunt   . Lung cancer Maternal Uncle   . Stomach cancer Maternal Grandmother   . Lung cancer Maternal Aunt   . Brain cancer Maternal Aunt   . Prostate cancer Maternal Grandfather   . Stroke Paternal Grandmother   . Cancer Paternal Grandfather   . Colon cancer Neg Hx   . Esophageal cancer Neg Hx   . Rectal cancer Neg Hx     Social History Social History   Tobacco Use  . Smoking status: Former Smoker    Packs/day: 0.50    Years: 35.00    Pack years: 17.50    Types: Cigarettes    Quit date: 05/22/2018    Years since quitting: 0.7  . Smokeless tobacco: Never Used  Substance Use Topics  . Alcohol use: Yes    Alcohol/week: 9.0 standard drinks    Types: 9 Glasses of wine per week    Comment: 1 drink nightly, 2 on weekends  . Drug use: No     Allergies   Ampicillin, Penicillins, Morphine and related, Other, Codeine, Hydrocodone-acetaminophen, and Hydromorphone   Review of Systems Review of Systems   Physical Exam Triage Vital Signs ED Triage Vitals  Enc Vitals Group     BP 02/07/19 1450 139/84     Pulse Rate 02/07/19 1450 100     Resp 02/07/19 1450 (!) 22     Temp 02/07/19 1450 97.6 F (36.4 C)     Temp Source 02/07/19 1450 Tympanic     SpO2 02/07/19 1450 93 %     Weight --      Height --      Head Circumference --      Peak Flow --      Pain Score 02/07/19 1516 0     Pain Loc --      Pain Edu? --      Excl. in [place]? --    No data found.  Updated Vital Signs BP 139/84 (BP Location: Right Arm)   Pulse 100   Temp 97.6 F (36.4 C) (Tympanic)   Resp (!) 22   SpO2 93%   Visual Acuity Right Eye Distance:   Left Eye Distance:   Bilateral Distance:    Right Eye Near:   Left  Eye Near:    Bilateral Near:     Physical Exam Vitals signs and nursing note reviewed.  Constitutional:      General: She is not in acute distress.    Appearance: Normal appearance. She is not ill-appearing, toxic-appearing or diaphoretic.  HENT:     Head: Normocephalic and atraumatic.     Nose: Nose normal.  Eyes:     Conjunctiva/sclera: Conjunctivae normal.  Cardiovascular:     Rate and Rhythm: Normal rate and regular rhythm.     Pulses: Normal pulses.     Heart sounds: Normal heart sounds.  Pulmonary:     Breath sounds: Rhonchi present.     Comments: Diffuse rhonchi in all fields Coarse cough Musculoskeletal: Normal range of motion.  Skin:    General: Skin is warm and dry.  Neurological:     Mental Status: She is alert.  Psychiatric:  Mood and Affect: Mood normal.      UC Treatments / Results  Labs (all labs ordered are listed, but only abnormal results are displayed) Labs Reviewed - No data to display  EKG   Radiology No results found.  Procedures Procedures (including critical care time)  Medications Ordered in UC Medications - No data to display  Initial Impression / Assessment and Plan / UC Course  I have reviewed the triage vital signs and the nursing notes.  Pertinent labs & imaging results that were available during my care of the patient were reviewed by me and considered in my medical decision making (see chart for details).     Bronchitis- treating with prednisone daily for 5 days.  She can continue the albuterol inhaler as needed.  Tessalon pearls for cough.  Recommended start with his treatment and if she is not seeing any improvement in the next couple days she can go ahead and start the azithromycin to cover any possible atypical bacterias. Patient understanding and agree Final Clinical Impressions(s) / UC Diagnoses   Final diagnoses:  Acute bronchitis, unspecified organism     Discharge Instructions     Treating you for  bronchitis and changing antibiotic to something that covers atypical bacteria's. Take the medication as prescribed Keep using the Mucinex and albuterol inhaler I am also can give you some Tessalon Perles for cough this could help Follow up as needed for continued or worsening symptoms     ED Prescriptions    Medication Sig Dispense Auth. Provider   azithromycin (ZITHROMAX) 250 MG tablet Take 1 tablet (250 mg total) by mouth daily. Take first 2 tablets together, then 1 every day until finished. 6 tablet Zana Biancardi A, NP   benzonatate (TESSALON) 100 MG capsule Take 1 capsule (100 mg total) by mouth every 8 (eight) hours. 21 capsule Dawsyn Zurn A, NP   predniSONE (DELTASONE) 10 MG tablet Take 4 tablets (40 mg total) by mouth daily for 5 days. 20 tablet Loura Halt A, NP     PDMP not reviewed this encounter.   Orvan July, NP 02/08/19 1119

## 2019-02-12 ENCOUNTER — Ambulatory Visit: Payer: PPO | Admitting: Neurology

## 2019-02-12 ENCOUNTER — Encounter: Payer: Self-pay | Admitting: Neurology

## 2019-02-12 ENCOUNTER — Other Ambulatory Visit: Payer: Self-pay

## 2019-02-12 VITALS — BP 156/88 | HR 89 | Temp 97.4°F | Ht 67.5 in | Wt 253.0 lb

## 2019-02-12 DIAGNOSIS — M545 Low back pain, unspecified: Secondary | ICD-10-CM | POA: Insufficient documentation

## 2019-02-12 DIAGNOSIS — R202 Paresthesia of skin: Secondary | ICD-10-CM

## 2019-02-12 MED ORDER — DULOXETINE HCL 60 MG PO CPEP: 60.0000 mg | ORAL_CAPSULE | Freq: Every day | ORAL | 12 refills | Status: DC

## 2019-02-12 NOTE — Progress Notes (Signed)
PATIENT: Brandi Stephens DOB: Aug 17, 1945  Chief Complaint  Patient presents with  . Pain    She is here with her daughter, [name].  Reports painful burning, numbness, tingling and pins/needles in bilateral arms, hands and fingers (especially first three).  She has tried gabapentin in the past but it caused swelling.   . Tremors    She has tremors in her both hands.  She uses Primidone 50mg  at bedtime which helps.  Marland Kitchen PCP    Girtha Rm, NP-C     HISTORICAL  Irvin KANDYSE BLUST is a 73 year old female, seen in request by her primary care PA [place], [place], for evaluation of bilateral hands and feet burning pain, tremor, initial evaluation was February 12, 2019.  I have reviewed and summarized the referring note from the referring physician.  She had a past medical history of hyperlipidemia, gout, depression anxiety, hypothyroidism, essential tremor, seen by Dr. Jim Like in 2014, was treated with primidone 50 mg every night, which has been helpful  She had a history of lumbar decompression surgery twice, first 1 in 2015, presented with low back pain radiating pain to left lower extremity, surgery, she began to develop bilateral feet paresthesia, worsening at left foot, she had 34 arthrodesis by Dr. Kathyrn Sheriff on June 29, 2018, which has helped her low back pain, however bilateral feet paresthesia is gradually getting worse, especially at the ball of her left foot, difficulty bearing weight, sometimes with burning pain  I personally reviewed CT myelogram November 2019, critical stenosis at L3-4 secondary to shot pedicles, central disc protrusion, and posterior element hypertrophy, status post L4-5 PLIF.  She also had a history of cervical decompression surgery in 1995, she presented with left neck pain, radiating pain to left shoulder, she now complains of bilateral fingertips paresthesia, drop things from her hands, burning pain, MRI of cervical spine is pending on February 23, 2019.    REVIEW OF SYSTEMS: Full 14 system review of systems performed and notable only for as above. All other review of systems were negative.  ALLERGIES: Allergies  Allergen Reactions  . Ampicillin Hives and Other (See Comments)    Severe reaction in February 2017 1.5 month to have hives to go away  . Penicillins Other (See Comments)    Severe reaction in February 2017 1.5 month to have hives to go away Has patient had a PCN reaction causing immediate rash, facial/tongue/throat swelling, SOB or lightheadedness with hypotension: #  #  #  YES  #  #  #  Has patient had a PCN reaction causing severe rash involving mucus membranes or skin necrosis: No Has patient had a PCN reaction that required hospitalization No Has patient had a PCN reaction occurring within the last 10 years: No   . Gabapentin Swelling  . Morphine And Related Itching    Patient states she is not allergic to morphine  . Other Itching    UNSPECIFIED Analgesics  . Codeine Nausea And Vomiting  . Hydrocodone-Acetaminophen Itching  . Hydromorphone Itching    Pt has taken w/o issues    HOME MEDICATIONS: Current Outpatient Medications  Medication Sig Dispense Refill  . albuterol (PROVENTIL HFA;VENTOLIN HFA) 108 (90 Base) MCG/ACT inhaler INHALE 2 PUFFS BY MOUTH EVERY 4 TO 6 HOURS AS NEEDED 42.5 g 0  . allopurinol (ZYLOPRIM) 300 MG tablet Take 1 tablet (300 mg total) by mouth daily. 90 tablet 0  . allopurinol (ZYLOPRIM) 300 MG tablet Take 300 mg by mouth daily.    Marland Kitchen  anastrozole (ARIMIDEX) 1 MG tablet TAKE 1 TABLET(1 MG) BY MOUTH DAILY 90 tablet 0  . atorvastatin (LIPITOR) 20 MG tablet TAKE 1 TABLET BY MOUTH EVERY DAY 90 tablet 0  . azithromycin (ZITHROMAX) 250 MG tablet Take 1 tablet (250 mg total) by mouth daily. Take first 2 tablets together, then 1 every day until finished. 6 tablet 0  . baclofen (LIORESAL) 10 MG tablet Take 1 tablet (10 mg) once daily as needed. (Patient taking differently: Take 10 mg by mouth as needed. )  90 tablet 0  . benzonatate (TESSALON) 100 MG capsule Take 1 capsule (100 mg total) by mouth every 8 (eight) hours. 21 capsule 0  . Biotin (BIOTIN 5000) 5 MG CAPS Take 5 mg by mouth daily.     . busPIRone (BUSPAR) 15 MG tablet Take 15 mg by mouth 3 (three) times daily.     . Calcium Carbonate-Vitamin D (CALCIUM 500/D PO) Take 2 each by mouth daily.     . colchicine 0.6 MG tablet Take 2 tablets now and 1 tablet an hour later for flare up 90 tablet 0  . diclofenac sodium (VOLTAREN) 1 % GEL APPLY 4 GRAMS EXTERNALLY TO THE AFFECTED AREA FOUR TIMES DAILY 150 g 0  . furosemide (LASIX) 20 MG tablet TAKE 1 TABLET BY MOUTH DAILY AS NEEDED 90 tablet 1  . hydrOXYzine (VISTARIL) 25 MG capsule Take 25 mg by mouth 2 (two) times daily as needed for anxiety.    Marland Kitchen levothyroxine (SYNTHROID) 75 MCG tablet TAKE 1 TABLET(75 MCG) BY MOUTH DAILY BEFORE BREAKFAST 90 tablet 0  . LINZESS 145 MCG CAPS capsule TAKE 1 CAPSULE BY MOUTH EVERY DAY BEFORE BREAKFAST 90 capsule 1  . Magnesium 500 MG CAPS Take 500 mg by mouth daily.    Marland Kitchen MEGARED OMEGA-3 KRILL OIL PO Take by mouth.    . Melatonin 3 MG CAPS Take 9 mg by mouth at bedtime.    . metoprolol succinate (TOPROL-XL) 25 MG 24 hr tablet TAKE 1/2 TABLET(12.5 MG) BY MOUTH DAILY (Patient taking differently: Take 12.5 mg by mouth at bedtime. ) 45 tablet 2  . olmesartan-hydrochlorothiazide (BENICAR HCT) 40-12.5 MG tablet TAKE 1 TABLET BY MOUTH DAILY 90 tablet 0  . polyethylene glycol powder (MIRALAX) powder Take 17 g by mouth daily as needed for moderate constipation.     . primidone (MYSOLINE) 50 MG tablet TAKE 1 TABLET(50 MG) BY MOUTH AT BEDTIME 90 tablet 0  . promethazine (PHENERGAN) 12.5 MG tablet Take 1 tablet (12.5 mg total) by mouth every 8 (eight) hours as needed for nausea or vomiting. 20 tablet 0  . rOPINIRole (REQUIP) 0.5 MG tablet Take 1.5 tablets (0.75 mg total) by mouth at bedtime. (Patient taking differently: Take 0.5 mg by mouth at bedtime. ) 140 tablet 3  . vitamin  B-12 (CYANOCOBALAMIN) 1000 MCG tablet Take 1,000 mcg by mouth daily.     No current facility-administered medications for this visit.     PAST MEDICAL HISTORY: Past Medical History:  Diagnosis Date  . AIN (anal intraepithelial neoplasia) anal canal   . Anemia    with pregnancy  . Anxiety   . Arthritis    knees, lower back, knees  . Chronic constipation   . Coarse tremors    Essential  . COPD (chronic obstructive pulmonary disease) ([place])    2017 chest CT, history of  . Degenerative lumbar spinal stenosis   . GERD (gastroesophageal reflux disease)    pt denies  . Gout   .  Grade I diastolic dysfunction 0000000   Noted ECHO  . Hemorrhoids   . History of adenomatous polyp of colon   . History of basal cell carcinoma (BCC) excision 2015   nose  . History of sepsis 05/14/2014   post lumbar surgery  . History of shingles    x2  . Hyperlipidemia   . Hypertension   . Hypothyroidism   . Irregular heart beat    history of while undergoing radiation  . Malignant neoplasm of upper-inner quadrant of left breast in female, estrogen receptor positive [facility]) oncologist-  dr Jana Hakim--  per lov in epic no recurrence   dx 07-13-2015  Left breast invasive ductal carcinoma, Stage IA, Grade 1 (TXN0),  09-16-2015  s/p  left breast lumpectomy with sln bx's,  completed radiation 11-18-2015,  started antiestrogen therapy 12-14-2015  . Neuropathy    Left foot and back of left leg  . Personal history of radiation therapy    completed 11-18-2015  left breast  . Pneumonia   . PONV (postoperative nausea and vomiting)   . Prediabetes    diet controlled, no med  . Restless leg syndrome   . Seasonal allergies   . Seizure ([place]) 05/2015   due to sepsis, only one time episode  . Tremor   . Wears partial dentures    lower     PAST SURGICAL HISTORY: Past Surgical History:  Procedure Laterality Date  . ABDOMINAL HYSTERECTOMY  1985  . BASAL CELL CARCINOMA EXCISION  10/15   nose  . BREAST  BIOPSY Right 08/24/2015  . BREAST BIOPSY Right 07/13/2015  . BREAST BIOPSY Left 07/13/2015  . BREAST EXCISIONAL BIOPSY Left 1972  . BREAST LUMPECTOMY Left   . BREAST LUMPECTOMY WITH RADIOACTIVE SEED AND SENTINEL LYMPH NODE BIOPSY Left 09/16/2015   Procedure: BREAST LUMPECTOMY WITH RADIOACTIVE SEED AND SENTINEL LYMPH NODE BIOPSY;  Surgeon: Stark Klein, MD;  Location: [facility];  Service: General;  Laterality: Left;  . BREAST REDUCTION SURGERY Bilateral 1998  . CATARACT EXTRACTION W/ INTRAOCULAR LENS IMPLANT Left 2016  . [place]   hallo  . COLONOSCOPY  01/2013   polyps  . EYE SURGERY Left 2016  . HAND SURGERY Left 02-19-2002   dr Fredna Dow  @[facility]    repair collateral ligament/ MPJ left thumb  . [place]  2008  . HIGH RESOLUTION ANOSCOPY N/A 02/28/2018   Procedure: HIGH RESOLUTION ANOSCOPY WITH BIOPSY;  Surgeon: Leighton Ruff, MD;  Location: [facility];  Service: General;  Laterality: N/A;  . KNEE ARTHROSCOPY Right 2002  . LUMBAR SPINE SURGERY  03-20-2015   dr Kathyrn Sheriff   fusion L4-5, L5-S1  . MOUTH SURGERY  07/14/2018   2 infected teeth removed  . MULTIPLE TOOTH EXTRACTIONS     with bone grafting lower bottom right and left  . REDUCTION MAMMAPLASTY Bilateral   . RIGHT COLECTOMY  09-10-2003   dr Margot Chimes @[facility]    multiple colon polyps (per path tubular adenoma's, hyperplastic , benign appendix, benign two lymph nodes  . ROTATOR CUFF REPAIR Left 04/2016  . TONSILLECTOMY  1969  . TUBAL LIGATION Bilateral 1982    FAMILY HISTORY: Family History  Problem Relation Age of Onset  . Atrial fibrillation Mother   . Ulcers Father   . Breast cancer Maternal Aunt   . Lung cancer Maternal Uncle   . Stomach cancer Maternal Grandmother   . Lung cancer Maternal Aunt   . Brain cancer Maternal Aunt   .  Prostate cancer Maternal Grandfather   . Stroke Paternal Grandmother   . Tuberculosis Paternal Grandfather   . Colon cancer Neg Hx   .  Esophageal cancer Neg Hx   . Rectal cancer Neg Hx     SOCIAL HISTORY: Social History   Socioeconomic History  . Marital status: Married    Spouse name: robert  . Number of children: 3  . Years of education: college  . Highest education level: Not on file  Occupational History  . Occupation: retired  Scientific laboratory technician  . Financial resource strain: Not on file  . Food insecurity    Worry: Not on file    Inability: Not on file  . Transportation needs    Medical: Not on file    Non-medical: Not on file  Tobacco Use  . Smoking status: Former Smoker    Packs/day: 0.50    Years: 35.00    Pack years: 17.50    Types: Cigarettes    Quit date: 05/22/2018    Years since quitting: 0.7  . Smokeless tobacco: Never Used  Substance and Sexual Activity  . Alcohol use: Yes    Comment: 10-12 drinks per week  . Drug use: Never  . Sexual activity: Yes    Birth control/protection: Post-menopausal  Lifestyle  . Physical activity    Days per week: Not on file    Minutes per session: Not on file  . Stress: Not on file  Relationships  . Social Herbalist on phone: Not on file    Gets together: Not on file    Attends religious service: Not on file    Active member of club or organization: Not on file    Attends meetings of clubs or organizations: Not on file    Relationship status: Not on file  . Intimate partner violence    Fear of current or ex partner: Not on file    Emotionally abused: Not on file    Physically abused: Not on file    Forced sexual activity: Not on file  Other Topics Concern  . Not on file  Social History Narrative   Lives with husband in a 2 story home.  Has 2 living children.  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  Education: college.    Right-handed.   Five cups coffee per week.     PHYSICAL EXAM   Vitals:   02/12/19 1340  BP: (!) 156/88  Pulse: 89  Temp: (!) 97.4 F (36.3 C)  Weight: 253 lb (114.8 kg)  Height: 5' 7.5" (1.715 m)     Not recorded      Body mass index is 39.04 kg/m.  PHYSICAL EXAMNIATION:  Gen: NAD, conversant, well nourised, well groomed                     Cardiovascular: Regular rate rhythm, no peripheral edema, warm, nontender. Eyes: Conjunctivae clear without exudates or hemorrhage Neck: Supple, no carotid bruits. Pulmonary: Clear to auscultation bilaterally   NEUROLOGICAL EXAM:  MENTAL STATUS: Speech:    Speech is normal; fluent and spontaneous with normal comprehension.  Cognition:     Orientation to time, place and person     Normal recent and remote memory     Normal Attention span and concentration     Normal Language, naming, repeating,spontaneous speech     Fund of knowledge   CRANIAL NERVES: CN II: Visual fields are full to confrontation.  Pupils are round  equal and briskly reactive to light. CN III, IV, VI: extraocular movement are normal. No ptosis. CN V: Facial sensation is intact to pinprick in all 3 divisions bilaterally. Corneal responses are intact.  CN VII: Face is symmetric with normal eye closure and smile. CN VIII: Hearing is normal to causal conversation. CN IX, X: Palate elevates symmetrically. Phonation is normal. CN XI: Head turning and shoulder shrug are intact CN XII: Tongue is midline with normal movements and no atrophy.  MOTOR: Mild Left arm external rotation, shoulder abduction weakness  REFLEXES: Reflexes are 1 and symmetric at the biceps, triceps, knees, and ankles. Plantar responses are flexor.  SENSORY: Length dependent decreased to light touch, pinprick, vibratory sensation to ankle level COORDINATION: Rapid alternating movements and fine finger movements are intact. There is no dysmetria on finger-to-nose and heel-knee-shin.    GAIT/STANCE: Posture is normal. Gait is steady with normal steps, base, arm swing, and turning. Heel and toe walking are normal. Tandem gait is normal.  Romberg is absent.   DIAGNOSTIC DATA (LABS, IMAGING, TESTING)  - I reviewed patient records, labs, notes, testing and imaging myself where available.   ASSESSMENT AND PLAN  Shalae Lamke Kloos is a 73 y.o. female   Bilateral upper and lower extremity paresthesia  Differentiation diagnosis including peripheral neuropathy, lumbar radiculopathy, cervical radiculopathy  MRI of cervical is pending  Laboratory evaluation for potential etiology of peripheral neuropathy     Marcial Pacas, M.D. Ph.D.  [facility] [address] [place], [place] [address] Ph: [phone] Fax: [phone]  CC: Girtha Rm, NP-C

## 2019-02-14 LAB — PROTEIN ELECTROPHORESIS
A/G Ratio: 1.5 (ref 0.7–1.7)
Albumin ELP: 4 g/dL (ref 2.9–4.4)
Alpha 1: 0.2 g/dL (ref 0.0–0.4)
Alpha 2: 1 g/dL (ref 0.4–1.0)
Beta: 0.9 g/dL (ref 0.7–1.3)
Gamma Globulin: 0.6 g/dL (ref 0.4–1.8)
Globulin, Total: 2.7 g/dL (ref 2.2–3.9)
Total Protein: 6.7 g/dL (ref 6.0–8.5)

## 2019-02-14 LAB — CK: Total CK: 42 U/L (ref 32–182)

## 2019-02-14 LAB — HIV ANTIBODY (ROUTINE TESTING W REFLEX): HIV Screen 4th Generation wRfx: NONREACTIVE

## 2019-02-14 LAB — RPR: RPR Ser Ql: NONREACTIVE

## 2019-02-14 LAB — ANA W/REFLEX IF POSITIVE: Anti Nuclear Antibody (ANA): NEGATIVE

## 2019-02-14 LAB — VITAMIN D 25 HYDROXY (VIT D DEFICIENCY, FRACTURES): Vit D, 25-Hydroxy: 30 ng/mL (ref 30.0–100.0)

## 2019-02-14 LAB — C-REACTIVE PROTEIN: CRP: <1 mg/L (ref 0–10)

## 2019-02-14 LAB — SEDIMENTATION RATE: Sed Rate: 6 mm/hr (ref 0–40)

## 2019-02-14 LAB — VITAMIN B12: Vitamin B-12: 1045 pg/mL (ref 232–1245)

## 2019-02-14 LAB — FOLATE: Folate: 13.2 ng/mL (ref >3.0)

## 2019-02-18 NOTE — Progress Notes (Signed)
[place]  [phone] [phone]     ID: Brandi Stephens DOB: Mar 11, 1946  MR#: [id number]  NID#:[id number]  Patient Care Team: Brandi Rm, NP-C as PCP - General (Family Medicine) Brandi Stephens, Brandi Dad, MD as Consulting Physician (Oncology) Brandi Klein, MD as Consulting Physician (General Surgery) Brandi Lung, MD as Consulting Physician (Family Medicine) Brandi Lose, MD as Consulting Physician (Neurosurgery) Brandi Miner, MD as Consulting Physician (Dermatology) Brandi Stephens, Brandi Massed, NP as Nurse Practitioner (Hematology and Oncology) Brandi Bond, MD as Consulting Physician (Orthopedic Surgery) Brandi Hakim, MD as Consulting Physician (Anesthesiology) Brandi Stephens, Brandi Raspberry, MD as Consulting Physician (Gastroenterology) OTHER MD:  CHIEF COMPLAINT: Estrogen receptor positive breast cancer  CURRENT TREATMENT: Anastrozole   INTERVAL HISTORY: Brandi Stephens returns today for follow-up of her estrogen receptor positive breast cancer.   She found that all her symptoms continued off anastrozole and so she went back on anastrozole, with good tolerance.  Specifically hot flashes and vaginal dryness are not an issue for her.  Since her last visit, she underwent bilateral diagnostic mammography with tomography at [facility] on 12/06/2018 showing: breast density category C; no evidence of malignancy in either breast.  She also underwent coronavirus testing on 01/24/2019, which was negative.  She presented to urgent care on 02/07/2019 with persistent and worsening productive cough. She was diagnosed with bronchitis and given prednisone and tessalon pearls. She was given a prescription for azithromycin if her symptoms did not improve.   REVIEW OF SYSTEMS: Brandi Stephens has been "in misery" because of her persistent cough.  She was treated with Cipro for a week by Dr. Redmond Stephens and then through a Z-Pak and steroids for 5 days from urgent  care.  None of that made any difference.  Her cough is worse than before and she is now making some yellow phlegm.  She is not aware of any fever since she has taken her temperature at least once daily.  She tells me she was tested for the coronavirus 01/24/2019 and was negative.  She has not had any rash, change in bowel habits, or other symptoms except that she is being evaluated by neurology and very extensive lab work was done recently which was all normal.  She has a little bit of ankle swelling.  A detailed review of systems today was otherwise stable     BREAST CANCER HISTORY: From the original intake note:  Brandi Stephens started to feel some right breast pain and soreness in March 2017. She brought it to Dr. Lanice Stephens nurse practitioner, Brandi Stephens and since attention, and she was set up for bilateral diagnostic mammography [facility] 07[facility]2015 area did the breast density was category C. The patient is status post bilateral reduction mammoplasties. Mammography showed no suspicious masses or calcifications in the right breast, and ultrasonography of the area where she has discomfort shows an area of distortion in the right breast at the 8:00 position 12 cm from the nipple measuring 1.4 cm thought to represent scarring related to the prior reduction surgery, but warranting further evaluation.  Also, in the left breast there was an area of distortion in the upper inner quadrant noted mammographically. Targeted ultrasound evaluating the left breast found no sonographic correlate to the mammographic findings. Ultrasound evaluation of both axillae were benign.  On 07/13/2015 Brandi Stephens underwent biopsy of the right breast area in question which showed only fibrocystic changes with adenosis. Biopsy of the left breast upper inner quadrant however showed invasive ductal carcinoma, grade  1, estrogen receptor 90% positive, progesterone receptor 70% positive, WITH strong staining intensity, with  an MIB-1 of 3%, and no HER-2 amplification, the signals ratio being 0.97 and the number per cell 1.65.  The patient's subsequent history is as detailed below   PAST MEDICAL HISTORY: Past Medical History:  Diagnosis Date  . AIN (anal intraepithelial neoplasia) anal canal   . Anemia    with pregnancy  . Anxiety   . Arthritis    knees, lower back, knees  . Chronic constipation   . Coarse tremors    Essential  . COPD (chronic obstructive pulmonary disease) ([place])    2017 chest CT, history of  . Degenerative lumbar spinal stenosis   . GERD (gastroesophageal reflux disease)    pt denies  . Gout   . Grade I diastolic dysfunction 37/34/2876   Noted ECHO  . Hemorrhoids   . History of adenomatous polyp of colon   . History of basal cell carcinoma (BCC) excision 2015   nose  . History of sepsis 05/14/2014   post lumbar surgery  . History of shingles    x2  . Hyperlipidemia   . Hypertension   . Hypothyroidism   . Irregular heart beat    history of while undergoing radiation  . Malignant neoplasm of upper-inner quadrant of left breast in female, estrogen receptor positive [facility]) oncologist-  dr Brandi Stephens--  per lov in epic no recurrence   dx 07-13-2015  Left breast invasive ductal carcinoma, Stage IA, Grade 1 (TXN0),  09-16-2015  s/p  left breast lumpectomy with sln bx's,  completed radiation 11-18-2015,  started antiestrogen therapy 12-14-2015  . Neuropathy    Left foot and back of left leg  . Personal history of radiation therapy    completed 11-18-2015  left breast  . Pneumonia   . PONV (postoperative nausea and vomiting)   . Prediabetes    diet controlled, no med  . Restless leg syndrome   . Seasonal allergies   . Seizure ([place]) 05/2015   due to sepsis, only one time episode  . Tremor   . Wears partial dentures    lower     PAST SURGICAL HISTORY: Past Surgical History:  Procedure Laterality Date  . ABDOMINAL HYSTERECTOMY  1985  . BASAL CELL CARCINOMA EXCISION  10/15    nose  . BREAST BIOPSY Right 08/24/2015  . BREAST BIOPSY Right 07/13/2015  . BREAST BIOPSY Left 07/13/2015  . BREAST EXCISIONAL BIOPSY Left 1972  . BREAST LUMPECTOMY Left   . BREAST LUMPECTOMY WITH RADIOACTIVE SEED AND SENTINEL LYMPH NODE BIOPSY Left 09/16/2015   Procedure: BREAST LUMPECTOMY WITH RADIOACTIVE SEED AND SENTINEL LYMPH NODE BIOPSY;  Surgeon: Brandi Klein, MD;  Location: [facility];  Service: General;  Laterality: Left;  . BREAST REDUCTION SURGERY Bilateral 1998  . CATARACT EXTRACTION W/ INTRAOCULAR LENS IMPLANT Left 2016  . [place]   hallo  . COLONOSCOPY  01/2013   polyps  . EYE SURGERY Left 2016  . HAND SURGERY Left 02-19-2002   dr Fredna Dow  '@[facility]'    repair collateral ligament/ MPJ left thumb  . [place]  2008  . HIGH RESOLUTION ANOSCOPY N/A 02/28/2018   Procedure: HIGH RESOLUTION ANOSCOPY WITH BIOPSY;  Surgeon: Leighton Ruff, MD;  Location: [facility];  Service: General;  Laterality: N/A;  . KNEE ARTHROSCOPY Right 2002  . LUMBAR SPINE SURGERY  03-20-2015   dr Kathyrn Sheriff   fusion L4-5, L5-S1  . MOUTH SURGERY  07/14/2018   2 infected teeth removed  . MULTIPLE TOOTH EXTRACTIONS     with bone grafting lower bottom right and left  . REDUCTION MAMMAPLASTY Bilateral   . RIGHT COLECTOMY  09-10-2003   dr Margot Chimes '@[facility]'    multiple colon polyps (per path tubular adenoma's, hyperplastic , benign appendix, benign two lymph nodes  . ROTATOR CUFF REPAIR Left 04/2016  . TONSILLECTOMY  1969  . TUBAL LIGATION Bilateral 1982    FAMILY HISTORY Family History  Problem Relation Age of Onset  . Atrial fibrillation Mother   . Ulcers Father   . Breast cancer Maternal Aunt   . Stephens cancer Maternal Uncle   . Stomach cancer Maternal Grandmother   . Stephens cancer Maternal Aunt   . Brain cancer Maternal Aunt   . Prostate cancer Maternal Grandfather   . Stroke Paternal Grandmother   . Tuberculosis Paternal Grandfather   . Colon cancer  Neg Hx   . Esophageal cancer Neg Hx   . Rectal cancer Neg Hx   The patient's father died at the age of 33 and her mother at the age of 16. The patient had no brothers, 2 sisters. The patient's mother was diagnosed with uterine cancer at the age of 34. The patient's sister was diagnosed with labial cancer at the age of 64. There is a maternal aunt with a history of breast cancer but the patient does not know at what age she was diagnosed. There is also on the mother's side history of Stephens cancer brain cancer and stomach cancer.    GYNECOLOGIC HISTORY:  No LMP recorded. Patient has had a hysterectomy.  Menarche age 59 first live birth age 80, the patient is [place] P3. She underwent hysterectomy without salpingo-oophorectomy at age 67. She took hormone replacement only for a few months.    SOCIAL HISTORY:   the patient is a retired Architect. Her husband on is also retired. He used to work for [employer]. Daughter [name] lives in [place] and she is a Wellsite geologist, currently not employed. Daughter [name] lives in [place] and has a Brewing technologist in social work but is not currently working. Son [name] died at age 58 from causes not clear after autopsy. He had had significant neurologic damage following an accident . She has one grandchild.    ADVANCED DIRECTIVES:  not in place    HEALTH MAINTENANCE: Social History   Tobacco Use  . Smoking status: Former Smoker    Packs/day: 0.50    Years: 35.00    Pack years: 17.50    Types: Cigarettes    Quit date: 05/22/2018    Years since quitting: 0.7  . Smokeless tobacco: Never Used  Substance Use Topics  . Alcohol use: Yes    Comment: 10-12 drinks per week  . Drug use: Never     Colonoscopy: 2015/[facility]   PAP:  Bone density: 2016  Lipid panel:  Allergies  Allergen Reactions  . Ampicillin Hives and Other (See Comments)    Severe reaction in February 2017 1.5 month to have hives to go away  . Penicillins Other (See Comments)     Severe reaction in February 2017 1.5 month to have hives to go away Has patient had a PCN reaction causing immediate rash, facial/tongue/throat swelling, SOB or lightheadedness with hypotension: #  #  #  YES  #  #  #  Has patient had a PCN reaction causing severe rash involving mucus membranes or skin necrosis: No Has patient had  a PCN reaction that required hospitalization No Has patient had a PCN reaction occurring within the last 10 years: No   . Gabapentin Swelling  . Morphine And Related Itching    Patient states she is not allergic to morphine  . Other Itching    UNSPECIFIED Analgesics  . Codeine Nausea And Vomiting  . Hydrocodone-Acetaminophen Itching  . Hydromorphone Itching    Pt has taken w/o issues    Current Outpatient Medications  Medication Sig Dispense Refill  . albuterol (PROVENTIL HFA;VENTOLIN HFA) 108 (90 Base) MCG/ACT inhaler INHALE 2 PUFFS BY MOUTH EVERY 4 TO 6 HOURS AS NEEDED 42.5 g 0  . allopurinol (ZYLOPRIM) 300 MG tablet Take 1 tablet (300 mg total) by mouth daily. 90 tablet 0  . allopurinol (ZYLOPRIM) 300 MG tablet Take 300 mg by mouth daily.    Marland Kitchen anastrozole (ARIMIDEX) 1 MG tablet TAKE 1 TABLET(1 MG) BY MOUTH DAILY 90 tablet 0  . atorvastatin (LIPITOR) 20 MG tablet TAKE 1 TABLET BY MOUTH EVERY DAY 90 tablet 0  . azithromycin (ZITHROMAX) 250 MG tablet Take 1 tablet (250 mg total) by mouth daily. Take first 2 tablets together, then 1 every day until finished. 6 tablet 0  . baclofen (LIORESAL) 10 MG tablet Take 1 tablet (10 mg) once daily as needed. (Patient taking differently: Take 10 mg by mouth as needed. ) 90 tablet 0  . benzonatate (TESSALON) 100 MG capsule Take 1 capsule (100 mg total) by mouth every 8 (eight) hours. 21 capsule 0  . Biotin (BIOTIN 5000) 5 MG CAPS Take 5 mg by mouth daily.     . busPIRone (BUSPAR) 15 MG tablet Take 15 mg by mouth 3 (three) times daily.     . Calcium Carbonate-Vitamin D (CALCIUM 500/D PO) Take 2 each by mouth daily.     .  colchicine 0.6 MG tablet Take 2 tablets now and 1 tablet an hour later for flare up 90 tablet 0  . diclofenac sodium (VOLTAREN) 1 % GEL APPLY 4 GRAMS EXTERNALLY TO THE AFFECTED AREA FOUR TIMES DAILY 150 g 0  . DULoxetine (CYMBALTA) 60 MG capsule Take 1 capsule (60 mg total) by mouth daily. 30 capsule 12  . furosemide (LASIX) 20 MG tablet TAKE 1 TABLET BY MOUTH DAILY AS NEEDED 90 tablet 1  . hydrOXYzine (VISTARIL) 25 MG capsule Take 25 mg by mouth 2 (two) times daily as needed for anxiety.    Marland Kitchen levothyroxine (SYNTHROID) 75 MCG tablet TAKE 1 TABLET(75 MCG) BY MOUTH DAILY BEFORE BREAKFAST 90 tablet 0  . LINZESS 145 MCG CAPS capsule TAKE 1 CAPSULE BY MOUTH EVERY DAY BEFORE BREAKFAST 90 capsule 1  . Magnesium 500 MG CAPS Take 500 mg by mouth daily.    Marland Kitchen MEGARED OMEGA-3 KRILL OIL PO Take by mouth.    . Melatonin 3 MG CAPS Take 9 mg by mouth at bedtime.    . metoprolol succinate (TOPROL-XL) 25 MG 24 hr tablet TAKE 1/2 TABLET(12.5 MG) BY MOUTH DAILY (Patient taking differently: Take 12.5 mg by mouth at bedtime. ) 45 tablet 2  . olmesartan-hydrochlorothiazide (BENICAR HCT) 40-12.5 MG tablet TAKE 1 TABLET BY MOUTH DAILY 90 tablet 0  . polyethylene glycol powder (MIRALAX) powder Take 17 g by mouth daily as needed for moderate constipation.     . primidone (MYSOLINE) 50 MG tablet TAKE 1 TABLET(50 MG) BY MOUTH AT BEDTIME 90 tablet 0  . promethazine (PHENERGAN) 12.5 MG tablet Take 1 tablet (12.5 mg total) by mouth every 8 (  eight) hours as needed for nausea or vomiting. 20 tablet 0  . rOPINIRole (REQUIP) 0.5 MG tablet Take 1.5 tablets (0.75 mg total) by mouth at bedtime. (Patient taking differently: Take 0.5 mg by mouth at bedtime. ) 140 tablet 3  . sulfamethoxazole-trimethoprim (BACTRIM DS) 800-160 MG tablet Take 1 tablet by mouth 2 (two) times daily. 14 tablet 0  . vitamin B-12 (CYANOCOBALAMIN) 1000 MCG tablet Take 1,000 mcg by mouth daily.     No current facility-administered medications for this visit.      OBJECTIVE: Middle-aged white woman with a wet sounding cough  Vitals:   02/19/19 1335  BP: 134/82  Pulse: (!) 105  Resp: 18  Temp: 97.8 F (36.6 C)  SpO2: 90%     Body mass index is 39.26 kg/m.    ECOG FS:1 - Symptomatic but completely ambulatory Filed Weights   02/19/19 1335  Weight: 254 lb 6.4 oz (115.4 kg)    Sclerae unicteric, EOMs intact Wearing a mask No cervical or supraclavicular adenopathy Lungs no rales or rhonchi, fair excursion bilaterally Heart regular rate and rhythm Abd soft, nontender, positive bowel sounds MSK no focal spinal tenderness, no upper extremity lymphedema Neuro: nonfocal, well oriented, appropriate affect Breasts: The right breast is benign.  The left breast is status post lumpectomy and radiation with no evidence of disease recurrence.  Both axillae are benign.   LAB RESULTS:  CMP     Component Value Date/Time   NA 141 12/28/2018 1012   NA 142 06/28/2016 1458   K 4.4 12/28/2018 1012   K 5.2 (H) 06/28/2016 1458   CL 106 12/28/2018 1012   CO2 27 12/28/2018 1012   CO2 24 06/28/2016 1458   GLUCOSE 118 (H) 12/28/2018 1012   GLUCOSE 132 (H) 11/13/2018 1348   GLUCOSE 114 06/28/2016 1458   BUN 42 (H) 12/28/2018 1012   BUN 23.6 06/28/2016 1458   CREATININE 1.47 (H) 12/28/2018 1012   CREATININE 0.97 07/08/2016 1042   CREATININE 1.0 06/28/2016 1458   CALCIUM 10.4 (H) 12/28/2018 1012   CALCIUM 10.3 06/28/2016 1458   PROT 6.7 02/12/2019 1443   PROT 6.8 06/28/2016 1458   ALBUMIN 4.5 12/20/2018 1500   ALBUMIN 3.6 06/28/2016 1458   AST 21 12/20/2018 1500   AST 18 06/28/2016 1458   ALT 25 12/20/2018 1500   ALT 22 06/28/2016 1458   ALKPHOS 93 12/20/2018 1500   ALKPHOS 101 06/28/2016 1458   BILITOT 0.3 12/20/2018 1500   BILITOT 0.48 06/28/2016 1458   GFRNONAA 35 (L) 12/28/2018 1012   GFRAA 41 (L) 12/28/2018 1012    INo results found for: SPEP, UPEP  Lab Results  Component Value Date   WBC 11.3 (H) 02/19/2019   NEUTROABS 8.9 (H)  02/19/2019   HGB 11.3 (L) 02/19/2019   HCT 35.5 (L) 02/19/2019   MCV 102.9 (H) 02/19/2019   PLT 204 02/19/2019      Chemistry      Component Value Date/Time   NA 141 12/28/2018 1012   NA 142 06/28/2016 1458   K 4.4 12/28/2018 1012   K 5.2 (H) 06/28/2016 1458   CL 106 12/28/2018 1012   CO2 27 12/28/2018 1012   CO2 24 06/28/2016 1458   BUN 42 (H) 12/28/2018 1012   BUN 23.6 06/28/2016 1458   CREATININE 1.47 (H) 12/28/2018 1012   CREATININE 0.97 07/08/2016 1042   CREATININE 1.0 06/28/2016 1458      Component Value Date/Time   CALCIUM 10.4 (H) 12/28/2018 1012  CALCIUM 10.3 06/28/2016 1458   ALKPHOS 93 12/20/2018 1500   ALKPHOS 101 06/28/2016 1458   AST 21 12/20/2018 1500   AST 18 06/28/2016 1458   ALT 25 12/20/2018 1500   ALT 22 06/28/2016 1458   BILITOT 0.3 12/20/2018 1500   BILITOT 0.48 06/28/2016 1458       No results found for: LABCA2  No components found for: LABCA125  No results for input(s): INR in the last 168 hours.  Urinalysis    Component Value Date/Time   COLORURINE YELLOW 05/15/2015 1218   APPEARANCEUR CLEAR 05/15/2015 1218   LABSPEC 1.025 12/13/2018 1153   PHURINE 5.0 05/15/2015 [place] 05/15/2015 [place] 05/15/2015 1218   BILIRUBINUR negative 12/13/2018 1153   BILIRUBINUR n 11/25/2015 1712   KETONESUR negative 12/13/2018 [place] 05/15/2015 1218   PROTEINUR negative 12/13/2018 1153   PROTEINUR n 11/25/2015 1712   PROTEINUR NEGATIVE 05/15/2015 1218   UROBILINOGEN negative 11/25/2015 1712   NITRITE Negative 12/13/2018 1153   NITRITE n 11/25/2015 1712   NITRITE NEGATIVE 05/15/2015 1218   LEUKOCYTESUR Small (1+) (A) 12/13/2018 1153     ELIGIBLE FOR AVAILABLE RESEARCH PROTOCOL: no  STUDIES: No results found.   ASSESSMENT: 73 y.o. [place] woman  Status post left breast upper inner quadrant biopsy 07/13/2015 for a clinical [place] N0 invasive ductal carcinoma, grade 1, estrogen and progesterone  receptor positive, HER-2 nonamplified, with an MIB-1 of 3%.  (a) biopsy of a suspicious lesion in the right breast proved to be a small fibroadenoma  (1) left lumpectomy and sentinel lymph node sampling 09/16/2015 showed a pT1a pN0 invasive ductal carcinoma, grade 1, with negative margins, stage IA    (2) adjuvant radiation completed 11/18/2015  (3) anastrozole started 12/14/2015, interrupted August 2020, resumed OCT 2020  (a) bone density on 08/16/2016 showed a T score of of -2.0 osteopenia -left hip improved from prior 2002  PLAN: Samyuktha is now about 3-1/2 years out from definitive surgery for her breast cancer with no evidence of disease recurrence.  She has reconciled herself with anastrozole and is now tolerating it well.  The plan will be to continue that a minimum of 5 years.  Given the fact that her white cell is elevated I do not think she is really just having a post bronchitic cough.  I think she is having an infection.  I am putting her on Bactrim for 7 days and she will also have a chest x-ray today which I expect will not show a pneumonia.  If she does not improve with these interventions she will contact Dr. Redmond Stephens and discuss it further with him.  I suggested she cut down on salt intake as far as the ankle swelling is concerned.  She knows to call for any other issue that may develop before the next visit. Brandi Stephens, Brandi Dad, MD  02/19/19 2:00 PM Medical Oncology and Hematology [facility] [place], [place] [address] Tel. [phone]    Fax. [phone]   I, Wilburn Mylar, am acting as scribe for Dr. Virgie Stephens. Brandi Stephens.  I, Lurline Del MD, have reviewed the above documentation for accuracy and completeness, and I agree with the above.

## 2019-02-19 ENCOUNTER — Telehealth: Payer: Self-pay | Admitting: Oncology

## 2019-02-19 ENCOUNTER — Other Ambulatory Visit: Payer: Self-pay

## 2019-02-19 ENCOUNTER — Other Ambulatory Visit: Payer: PPO

## 2019-02-19 ENCOUNTER — Ambulatory Visit: Payer: PPO | Admitting: Oncology

## 2019-02-19 ENCOUNTER — Inpatient Hospital Stay: Payer: PPO | Attending: Oncology

## 2019-02-19 ENCOUNTER — Ambulatory Visit (HOSPITAL_COMMUNITY)
Admission: RE | Admit: 2019-02-19 | Discharge: 2019-02-19 | Disposition: A | Discharge status: Home / Self Care | Payer: PPO | Source: Ambulatory Visit | Attending: Oncology | Admitting: Oncology

## 2019-02-19 ENCOUNTER — Inpatient Hospital Stay (HOSPITAL_BASED_OUTPATIENT_CLINIC_OR_DEPARTMENT_OTHER): Payer: PPO | Admitting: Oncology

## 2019-02-19 VITALS — BP 134/82 | HR 105 | Temp 97.8°F | Resp 18 | Wt 254.4 lb

## 2019-02-19 DIAGNOSIS — I10 Essential (primary) hypertension: Secondary | ICD-10-CM | POA: Diagnosis not present

## 2019-02-19 DIAGNOSIS — Z803 Family history of malignant neoplasm of breast: Secondary | ICD-10-CM | POA: Diagnosis not present

## 2019-02-19 DIAGNOSIS — Z170 Estrogen receptor positive status [ER+]: Secondary | ICD-10-CM

## 2019-02-19 DIAGNOSIS — Z801 Family history of malignant neoplasm of trachea, bronchus and lung: Secondary | ICD-10-CM | POA: Insufficient documentation

## 2019-02-19 DIAGNOSIS — J449 Chronic obstructive pulmonary disease, unspecified: Secondary | ICD-10-CM | POA: Insufficient documentation

## 2019-02-19 DIAGNOSIS — Z8042 Family history of malignant neoplasm of prostate: Secondary | ICD-10-CM | POA: Diagnosis not present

## 2019-02-19 DIAGNOSIS — Z79811 Long term (current) use of aromatase inhibitors: Secondary | ICD-10-CM | POA: Diagnosis not present

## 2019-02-19 DIAGNOSIS — C50212 Malignant neoplasm of upper-inner quadrant of left female breast: Secondary | ICD-10-CM

## 2019-02-19 DIAGNOSIS — Z79899 Other long term (current) drug therapy: Secondary | ICD-10-CM | POA: Insufficient documentation

## 2019-02-19 DIAGNOSIS — E875 Hyperkalemia: Secondary | ICD-10-CM

## 2019-02-19 DIAGNOSIS — Z87891 Personal history of nicotine dependence: Secondary | ICD-10-CM | POA: Diagnosis not present

## 2019-02-19 DIAGNOSIS — Z8049 Family history of malignant neoplasm of other genital organs: Secondary | ICD-10-CM | POA: Insufficient documentation

## 2019-02-19 DIAGNOSIS — N6310 Unspecified lump in the right breast, unspecified quadrant: Secondary | ICD-10-CM

## 2019-02-19 DIAGNOSIS — R05 Cough: Secondary | ICD-10-CM | POA: Diagnosis not present

## 2019-02-19 LAB — CBC WITH DIFFERENTIAL/PLATELET
Abs Immature Granulocytes: 0.27 10*3/uL — ABNORMAL HIGH (ref 0.00–0.07)
Basophils Absolute: 0.1 10*3/uL (ref 0.0–0.1)
Basophils Relative: 1 %
Eosinophils Absolute: 0.2 10*3/uL (ref 0.0–0.5)
Eosinophils Relative: 1 %
HCT: 35.5 % — ABNORMAL LOW (ref 36.0–46.0)
Hemoglobin: 11.3 g/dL — ABNORMAL LOW (ref 12.0–15.0)
Immature Granulocytes: 2 %
Lymphocytes Relative: 10 %
Lymphs Abs: 1.1 10*3/uL (ref 0.7–4.0)
MCH: 32.8 pg (ref 26.0–34.0)
MCHC: 31.8 g/dL (ref 30.0–36.0)
MCV: 102.9 fL — ABNORMAL HIGH (ref 80.0–100.0)
Monocytes Absolute: 0.8 10*3/uL (ref 0.1–1.0)
Monocytes Relative: 7 %
Neutro Abs: 8.9 10*3/uL — ABNORMAL HIGH (ref 1.7–7.7)
Neutrophils Relative %: 79 %
Platelets: 204 10*3/uL (ref 150–400)
RBC: 3.45 MIL/uL — ABNORMAL LOW (ref 3.87–5.11)
RDW: 14.1 % (ref 11.5–15.5)
WBC: 11.3 10*3/uL — ABNORMAL HIGH (ref 4.0–10.5)
nRBC: 0 % (ref 0.0–0.2)

## 2019-02-19 MED ORDER — SULFAMETHOXAZOLE-TRIMETHOPRIM 800-160 MG PO TABS: 1.0000 | ORAL_TABLET | Freq: Two times a day (BID) | ORAL | 0 refills | Status: DC

## 2019-02-19 NOTE — Telephone Encounter (Signed)
I talk with patient regarding schedule  

## 2019-02-20 ENCOUNTER — Other Ambulatory Visit: Payer: Self-pay | Admitting: Oncology

## 2019-02-20 NOTE — Progress Notes (Signed)
Called Brandi Stephens with the results of her chest x-ray.  She is improving clinically after starting the Septra last night.  She will take the full 7 days and call us if things do not completely resolve clinically by then.

## 2019-02-23 ENCOUNTER — Other Ambulatory Visit: Payer: PPO

## 2019-03-01 ENCOUNTER — Emergency Department (HOSPITAL_COMMUNITY): Payer: PPO

## 2019-03-01 ENCOUNTER — Encounter (HOSPITAL_COMMUNITY): Payer: Self-pay

## 2019-03-01 ENCOUNTER — Other Ambulatory Visit: Payer: Self-pay

## 2019-03-01 ENCOUNTER — Inpatient Hospital Stay (HOSPITAL_COMMUNITY)
Admission: EM | Admit: 2019-03-01 | Discharge: 2019-03-04 | DRG: 871 | Disposition: A | Discharge status: Home / Self Care | Payer: PPO | Attending: Internal Medicine | Admitting: Internal Medicine

## 2019-03-01 ENCOUNTER — Ambulatory Visit (HOSPITAL_COMMUNITY)
Admission: EM | Admit: 2019-03-01 | Discharge: 2019-03-01 | Disposition: A | Discharge status: Home / Self Care | Payer: PPO | Source: Home / Self Care

## 2019-03-01 DIAGNOSIS — F329 Major depressive disorder, single episode, unspecified: Secondary | ICD-10-CM | POA: Diagnosis not present

## 2019-03-01 DIAGNOSIS — G9341 Metabolic encephalopathy: Secondary | ICD-10-CM | POA: Diagnosis not present

## 2019-03-01 DIAGNOSIS — I129 Hypertensive chronic kidney disease with stage 1 through stage 4 chronic kidney disease, or unspecified chronic kidney disease: Secondary | ICD-10-CM | POA: Diagnosis present

## 2019-03-01 DIAGNOSIS — N1832 Chronic kidney disease, stage 3b: Secondary | ICD-10-CM | POA: Diagnosis not present

## 2019-03-01 DIAGNOSIS — Z800 Family history of malignant neoplasm of digestive organs: Secondary | ICD-10-CM

## 2019-03-01 DIAGNOSIS — T426X5A Adverse effect of other antiepileptic and sedative-hypnotic drugs, initial encounter: Secondary | ICD-10-CM | POA: Diagnosis not present

## 2019-03-01 DIAGNOSIS — N183 Chronic kidney disease, stage 3 unspecified: Secondary | ICD-10-CM | POA: Diagnosis present

## 2019-03-01 DIAGNOSIS — Z6839 Body mass index (BMI) 39.0-39.9, adult: Secondary | ICD-10-CM | POA: Diagnosis not present

## 2019-03-01 DIAGNOSIS — Z885 Allergy status to narcotic agent status: Secondary | ICD-10-CM

## 2019-03-01 DIAGNOSIS — T428X5A Adverse effect of antiparkinsonism drugs and other central muscle-tone depressants, initial encounter: Secondary | ICD-10-CM | POA: Diagnosis present

## 2019-03-01 DIAGNOSIS — E669 Obesity, unspecified: Secondary | ICD-10-CM | POA: Diagnosis present

## 2019-03-01 DIAGNOSIS — Z79890 Hormone replacement therapy (postmenopausal): Secondary | ICD-10-CM

## 2019-03-01 DIAGNOSIS — G2581 Restless legs syndrome: Secondary | ICD-10-CM | POA: Diagnosis present

## 2019-03-01 DIAGNOSIS — Z8619 Personal history of other infectious and parasitic diseases: Secondary | ICD-10-CM

## 2019-03-01 DIAGNOSIS — Z20828 Contact with and (suspected) exposure to other viral communicable diseases: Secondary | ICD-10-CM | POA: Diagnosis present

## 2019-03-01 DIAGNOSIS — E039 Hypothyroidism, unspecified: Secondary | ICD-10-CM | POA: Diagnosis not present

## 2019-03-01 DIAGNOSIS — T43595A Adverse effect of other antipsychotics and neuroleptics, initial encounter: Secondary | ICD-10-CM | POA: Diagnosis not present

## 2019-03-01 DIAGNOSIS — R4182 Altered mental status, unspecified: Secondary | ICD-10-CM | POA: Diagnosis not present

## 2019-03-01 DIAGNOSIS — R4020 Unspecified coma: Secondary | ICD-10-CM | POA: Diagnosis not present

## 2019-03-01 DIAGNOSIS — R0602 Shortness of breath: Secondary | ICD-10-CM | POA: Diagnosis not present

## 2019-03-01 DIAGNOSIS — I459 Conduction disorder, unspecified: Secondary | ICD-10-CM | POA: Diagnosis present

## 2019-03-01 DIAGNOSIS — E782 Mixed hyperlipidemia: Secondary | ICD-10-CM | POA: Diagnosis not present

## 2019-03-01 DIAGNOSIS — Z823 Family history of stroke: Secondary | ICD-10-CM

## 2019-03-01 DIAGNOSIS — G9340 Encephalopathy, unspecified: Secondary | ICD-10-CM | POA: Diagnosis not present

## 2019-03-01 DIAGNOSIS — Z85828 Personal history of other malignant neoplasm of skin: Secondary | ICD-10-CM

## 2019-03-01 DIAGNOSIS — J189 Pneumonia, unspecified organism: Secondary | ICD-10-CM | POA: Diagnosis present

## 2019-03-01 DIAGNOSIS — J440 Chronic obstructive pulmonary disease with acute lower respiratory infection: Secondary | ICD-10-CM | POA: Diagnosis not present

## 2019-03-01 DIAGNOSIS — Z808 Family history of malignant neoplasm of other organs or systems: Secondary | ICD-10-CM

## 2019-03-01 DIAGNOSIS — A419 Sepsis, unspecified organism: Principal | ICD-10-CM | POA: Diagnosis present

## 2019-03-01 DIAGNOSIS — G92 Toxic encephalopathy: Secondary | ICD-10-CM | POA: Diagnosis present

## 2019-03-01 DIAGNOSIS — Z79811 Long term (current) use of aromatase inhibitors: Secondary | ICD-10-CM

## 2019-03-01 DIAGNOSIS — Z8701 Personal history of pneumonia (recurrent): Secondary | ICD-10-CM

## 2019-03-01 DIAGNOSIS — Z801 Family history of malignant neoplasm of trachea, bronchus and lung: Secondary | ICD-10-CM

## 2019-03-01 DIAGNOSIS — Z880 Allergy status to penicillin: Secondary | ICD-10-CM

## 2019-03-01 DIAGNOSIS — Z888 Allergy status to other drugs, medicaments and biological substances status: Secondary | ICD-10-CM

## 2019-03-01 DIAGNOSIS — D631 Anemia in chronic kidney disease: Secondary | ICD-10-CM | POA: Diagnosis present

## 2019-03-01 DIAGNOSIS — F1010 Alcohol abuse, uncomplicated: Secondary | ICD-10-CM | POA: Diagnosis present

## 2019-03-01 DIAGNOSIS — N179 Acute kidney failure, unspecified: Secondary | ICD-10-CM | POA: Diagnosis present

## 2019-03-01 DIAGNOSIS — M1000 Idiopathic gout, unspecified site: Secondary | ICD-10-CM | POA: Diagnosis not present

## 2019-03-01 DIAGNOSIS — G250 Essential tremor: Secondary | ICD-10-CM | POA: Diagnosis present

## 2019-03-01 DIAGNOSIS — M109 Gout, unspecified: Secondary | ICD-10-CM | POA: Diagnosis present

## 2019-03-01 DIAGNOSIS — Z981 Arthrodesis status: Secondary | ICD-10-CM

## 2019-03-01 DIAGNOSIS — Z923 Personal history of irradiation: Secondary | ICD-10-CM

## 2019-03-01 DIAGNOSIS — I10 Essential (primary) hypertension: Secondary | ICD-10-CM | POA: Diagnosis not present

## 2019-03-01 DIAGNOSIS — F411 Generalized anxiety disorder: Secondary | ICD-10-CM | POA: Diagnosis not present

## 2019-03-01 DIAGNOSIS — R7303 Prediabetes: Secondary | ICD-10-CM | POA: Diagnosis present

## 2019-03-01 DIAGNOSIS — Z803 Family history of malignant neoplasm of breast: Secondary | ICD-10-CM

## 2019-03-01 DIAGNOSIS — Z853 Personal history of malignant neoplasm of breast: Secondary | ICD-10-CM

## 2019-03-01 DIAGNOSIS — K219 Gastro-esophageal reflux disease without esophagitis: Secondary | ICD-10-CM | POA: Diagnosis not present

## 2019-03-01 DIAGNOSIS — Z03818 Encounter for observation for suspected exposure to other biological agents ruled out: Secondary | ICD-10-CM | POA: Diagnosis not present

## 2019-03-01 DIAGNOSIS — Z0389 Encounter for observation for other suspected diseases and conditions ruled out: Secondary | ICD-10-CM | POA: Diagnosis not present

## 2019-03-01 DIAGNOSIS — Z79899 Other long term (current) drug therapy: Secondary | ICD-10-CM

## 2019-03-01 LAB — CBC WITH DIFFERENTIAL/PLATELET
Abs Immature Granulocytes: 0.11 10*3/uL — ABNORMAL HIGH (ref 0.00–0.07)
Abs Immature Granulocytes: 0.12 10*3/uL — ABNORMAL HIGH (ref 0.00–0.07)
Basophils Absolute: 0.1 10*3/uL (ref 0.0–0.1)
Basophils Absolute: 0.1 10*3/uL (ref 0.0–0.1)
Basophils Relative: 1 %
Basophils Relative: 1 %
Eosinophils Absolute: 0.3 10*3/uL (ref 0.0–0.5)
Eosinophils Absolute: 0.2 10*3/uL (ref 0.0–0.5)
Eosinophils Relative: 3 %
Eosinophils Relative: 2 %
HCT: 37.9 % (ref 36.0–46.0)
HCT: 37 % (ref 36.0–46.0)
Hemoglobin: 11.9 g/dL — ABNORMAL LOW (ref 12.0–15.0)
Hemoglobin: 11.9 g/dL — ABNORMAL LOW (ref 12.0–15.0)
Immature Granulocytes: 1 %
Immature Granulocytes: 1 %
Lymphocytes Relative: 19 %
Lymphocytes Relative: 18 %
Lymphs Abs: 1.7 10*3/uL (ref 0.7–4.0)
Lymphs Abs: 1.7 10*3/uL (ref 0.7–4.0)
MCH: 32.3 pg (ref 26.0–34.0)
MCH: 33.1 pg (ref 26.0–34.0)
MCHC: 31.4 g/dL (ref 30.0–36.0)
MCHC: 32.2 g/dL (ref 30.0–36.0)
MCV: 103 fL — ABNORMAL HIGH (ref 80.0–100.0)
MCV: 102.8 fL — ABNORMAL HIGH (ref 80.0–100.0)
Monocytes Absolute: 0.4 10*3/uL (ref 0.1–1.0)
Monocytes Absolute: 0.5 10*3/uL (ref 0.1–1.0)
Monocytes Relative: 5 %
Monocytes Relative: 6 %
Neutro Abs: 6.4 10*3/uL (ref 1.7–7.7)
Neutro Abs: 7 10*3/uL (ref 1.7–7.7)
Neutrophils Relative %: 71 %
Neutrophils Relative %: 72 %
Platelets: 322 10*3/uL (ref 150–400)
Platelets: 301 10*3/uL (ref 150–400)
RBC: 3.68 MIL/uL — ABNORMAL LOW (ref 3.87–5.11)
RBC: 3.6 MIL/uL — ABNORMAL LOW (ref 3.87–5.11)
RDW: 13.5 % (ref 11.5–15.5)
RDW: 13.5 % (ref 11.5–15.5)
WBC: 9 10*3/uL (ref 4.0–10.5)
WBC: 9.7 10*3/uL (ref 4.0–10.5)
nRBC: 0 % (ref 0.0–0.2)
nRBC: 0 % (ref 0.0–0.2)

## 2019-03-01 LAB — COMPREHENSIVE METABOLIC PANEL
ALT: 24 U/L (ref 0–44)
AST: 21 U/L (ref 15–41)
Albumin: 4.1 g/dL (ref 3.5–5.0)
Alkaline Phosphatase: 87 U/L (ref 38–126)
Anion gap: 11 (ref 5–15)
BUN: 36 mg/dL — ABNORMAL HIGH (ref 8–23)
CO2: 19 mmol/L — ABNORMAL LOW (ref 22–32)
Calcium: 9.5 mg/dL (ref 8.9–10.3)
Chloride: 106 mmol/L (ref 98–111)
Creatinine, Ser: 1.65 mg/dL — ABNORMAL HIGH (ref 0.44–1.00)
GFR calc Af Amer: 36 mL/min — ABNORMAL LOW (ref >60)
GFR calc non Af Amer: 31 mL/min — ABNORMAL LOW (ref >60)
Glucose, Bld: 129 mg/dL — ABNORMAL HIGH (ref 70–99)
Potassium: 5.5 mmol/L — ABNORMAL HIGH (ref 3.5–5.1)
Sodium: 136 mmol/L (ref 135–145)
Total Bilirubin: 0.8 mg/dL (ref 0.3–1.2)
Total Protein: 7 g/dL (ref 6.5–8.1)

## 2019-03-01 LAB — APTT: aPTT: 27 seconds (ref 24–36)

## 2019-03-01 LAB — LACTIC ACID, PLASMA
Lactic Acid, Venous: 2.4 mmol/L (ref 0.5–1.9)
Lactic Acid, Venous: 2.2 mmol/L (ref 0.5–1.9)

## 2019-03-01 LAB — PROTIME-INR
INR: 1 (ref 0.8–1.2)
Prothrombin Time: 13.5 seconds (ref 11.4–15.2)

## 2019-03-01 LAB — TSH: TSH: 2.346 u[IU]/mL (ref 0.350–4.500)

## 2019-03-01 LAB — AMMONIA: Ammonia: 28 umol/L (ref 9–35)

## 2019-03-01 MED ORDER — LORAZEPAM 2 MG/ML IJ SOLN
1.0000 mg | Freq: Once | INTRAMUSCULAR | Status: AC | PRN
  Administered 2019-03-02: 1 mg via INTRAVENOUS
  Filled 2019-03-01: qty 1

## 2019-03-01 MED ORDER — MAGNESIUM OXIDE 400 (241.3 MG) MG PO TABS
400.0000 mg | ORAL_TABLET | Freq: Every day | ORAL | Status: DC
  Administered 2019-03-02 – 2019-03-04 (×3): 400 mg via ORAL
  Filled 2019-03-01 (×3): qty 1

## 2019-03-01 MED ORDER — ANASTROZOLE 1 MG PO TABS
1.0000 mg | ORAL_TABLET | Freq: Every day | ORAL | Status: DC
  Administered 2019-03-02 – 2019-03-04 (×3): 1 mg via ORAL
  Filled 2019-03-01 (×3): qty 1

## 2019-03-01 MED ORDER — METOPROLOL SUCCINATE ER 25 MG PO TB24
12.5000 mg | ORAL_TABLET | Freq: Every day | ORAL | Status: DC
  Administered 2019-03-02 – 2019-03-03 (×2): 12.5 mg via ORAL
  Filled 2019-03-01 (×2): qty 1

## 2019-03-01 MED ORDER — BACLOFEN 10 MG PO TABS
10.0000 mg | ORAL_TABLET | Freq: Every day | ORAL | Status: DC | PRN
  Administered 2019-03-02: 19:00:00 10 mg via ORAL
  Filled 2019-03-01 (×2): qty 1

## 2019-03-01 MED ORDER — MELATONIN 3 MG PO TABS
9.0000 mg | ORAL_TABLET | Freq: Every day | ORAL | Status: DC
  Administered 2019-03-02 – 2019-03-03 (×2): 9 mg via ORAL
  Filled 2019-03-01 (×4): qty 3

## 2019-03-01 MED ORDER — SODIUM CHLORIDE 0.9% FLUSH: 10.0000 mL | INTRAVENOUS | Status: DC | PRN

## 2019-03-01 MED ORDER — ENOXAPARIN SODIUM 40 MG/0.4ML ~~LOC~~ SOLN
40.0000 mg | SUBCUTANEOUS | Status: DC
  Administered 2019-03-02 – 2019-03-04 (×3): 40 mg via SUBCUTANEOUS
  Filled 2019-03-01 (×4): qty 0.4

## 2019-03-01 MED ORDER — BIOTIN 5 MG PO CAPS: 5.0000 mg | ORAL_CAPSULE | Freq: Every day | ORAL | Status: DC

## 2019-03-01 MED ORDER — VANCOMYCIN HCL 10 G IV SOLR
1750.0000 mg | INTRAVENOUS | Status: DC
  Filled 2019-03-01: qty 1750

## 2019-03-01 MED ORDER — HYDROXYZINE HCL 25 MG PO TABS: 25.0000 mg | ORAL_TABLET | Freq: Two times a day (BID) | ORAL | Status: DC | PRN

## 2019-03-01 MED ORDER — SODIUM CHLORIDE 0.9% FLUSH
3.0000 mL | Freq: Once | INTRAVENOUS | Status: AC
  Administered 2019-03-01: 3 mL via INTRAVENOUS

## 2019-03-01 MED ORDER — PROMETHAZINE HCL 25 MG PO TABS
12.5000 mg | ORAL_TABLET | Freq: Three times a day (TID) | ORAL | Status: DC | PRN
  Administered 2019-03-02: 12.5 mg via ORAL
  Filled 2019-03-01: qty 1

## 2019-03-01 MED ORDER — ALLOPURINOL 300 MG PO TABS
300.0000 mg | ORAL_TABLET | Freq: Every day | ORAL | Status: DC
  Administered 2019-03-02 – 2019-03-04 (×3): 300 mg via ORAL
  Filled 2019-03-01 (×3): qty 1

## 2019-03-01 MED ORDER — LEVOTHYROXINE SODIUM 75 MCG PO TABS
75.0000 ug | ORAL_TABLET | Freq: Every day | ORAL | Status: DC
  Administered 2019-03-02 – 2019-03-04 (×3): 75 ug via ORAL
  Filled 2019-03-01 (×3): qty 1

## 2019-03-01 MED ORDER — VANCOMYCIN HCL 10 G IV SOLR
2000.0000 mg | Freq: Once | INTRAVENOUS | Status: AC
  Administered 2019-03-01: 2000 mg via INTRAVENOUS
  Filled 2019-03-01: qty 2000

## 2019-03-01 MED ORDER — SODIUM CHLORIDE 0.9% FLUSH
3.0000 mL | Freq: Two times a day (BID) | INTRAVENOUS | Status: DC
  Administered 2019-03-02 – 2019-03-03 (×4): 3 mL via INTRAVENOUS

## 2019-03-01 MED ORDER — SODIUM CHLORIDE 0.9 % IV SOLN
INTRAVENOUS | Status: DC
  Administered 2019-03-02: 03:00:00 via INTRAVENOUS

## 2019-03-01 MED ORDER — SODIUM CHLORIDE 0.9 % IV SOLN
2.0000 g | Freq: Once | INTRAVENOUS | Status: AC
  Administered 2019-03-01: 20:00:00 2 g via INTRAVENOUS
  Filled 2019-03-01: qty 2

## 2019-03-01 MED ORDER — VANCOMYCIN HCL IN DEXTROSE 1-5 GM/200ML-% IV SOLN: 1000.0000 mg | Freq: Once | INTRAVENOUS | Status: DC

## 2019-03-01 MED ORDER — ATORVASTATIN CALCIUM 10 MG PO TABS
20.0000 mg | ORAL_TABLET | Freq: Every day | ORAL | Status: DC
  Administered 2019-03-02 – 2019-03-04 (×3): 20 mg via ORAL
  Filled 2019-03-01 (×3): qty 2

## 2019-03-01 MED ORDER — POLYETHYLENE GLYCOL 3350 17 G PO PACK: 17.0000 g | PACK | Freq: Every day | ORAL | Status: DC | PRN

## 2019-03-01 MED ORDER — DULOXETINE HCL 60 MG PO CPEP
60.0000 mg | ORAL_CAPSULE | Freq: Every day | ORAL | Status: DC
  Administered 2019-03-02 – 2019-03-04 (×3): 60 mg via ORAL
  Filled 2019-03-01 (×3): qty 1

## 2019-03-01 MED ORDER — SODIUM CHLORIDE 0.9 % IV SOLN: 250.0000 mL | INTRAVENOUS | Status: DC | PRN

## 2019-03-01 MED ORDER — VITAMIN B-12 1000 MCG PO TABS
1000.0000 ug | ORAL_TABLET | Freq: Every day | ORAL | Status: DC
  Administered 2019-03-02 – 2019-03-04 (×3): 1000 ug via ORAL
  Filled 2019-03-01 (×3): qty 1

## 2019-03-01 MED ORDER — BUSPIRONE HCL 5 MG PO TABS
15.0000 mg | ORAL_TABLET | Freq: Three times a day (TID) | ORAL | Status: DC
  Administered 2019-03-02 – 2019-03-03 (×7): 15 mg via ORAL
  Filled 2019-03-01 (×7): qty 3

## 2019-03-01 MED ORDER — METRONIDAZOLE IN NACL 5-0.79 MG/ML-% IV SOLN
500.0000 mg | Freq: Once | INTRAVENOUS | Status: AC
  Administered 2019-03-01: 500 mg via INTRAVENOUS
  Filled 2019-03-01: qty 100

## 2019-03-01 MED ORDER — PRIMIDONE 50 MG PO TABS
50.0000 mg | ORAL_TABLET | Freq: Every day | ORAL | Status: DC
  Administered 2019-03-02 – 2019-03-03 (×2): 50 mg via ORAL
  Filled 2019-03-01 (×4): qty 1

## 2019-03-01 MED ORDER — SODIUM CHLORIDE 0.9 % IV BOLUS
1000.0000 mL | Freq: Once | INTRAVENOUS | Status: AC
  Administered 2019-03-01: 1000 mL via INTRAVENOUS

## 2019-03-01 MED ORDER — ROPINIROLE HCL 1 MG PO TABS
0.5000 mg | ORAL_TABLET | Freq: Every day | ORAL | Status: DC
  Administered 2019-03-02 – 2019-03-03 (×3): 0.5 mg via ORAL
  Filled 2019-03-01 (×4): qty 1

## 2019-03-01 MED ORDER — SODIUM CHLORIDE 0.9% FLUSH: 3.0000 mL | INTRAVENOUS | Status: DC | PRN

## 2019-03-01 MED ORDER — DICLOFENAC SODIUM 1 % TD GEL
4.0000 g | Freq: Four times a day (QID) | TRANSDERMAL | Status: DC
  Administered 2019-03-02 – 2019-03-04 (×10): 4 g via TOPICAL
  Filled 2019-03-01: qty 100

## 2019-03-01 MED ORDER — ALBUTEROL SULFATE HFA 108 (90 BASE) MCG/ACT IN AERS
2.0000 | INHALATION_SPRAY | RESPIRATORY_TRACT | Status: DC | PRN
  Filled 2019-03-01: qty 6.7

## 2019-03-01 MED ORDER — SODIUM CHLORIDE 0.9 % IV SOLN
2.0000 g | Freq: Three times a day (TID) | INTRAVENOUS | Status: DC
  Administered 2019-03-02 – 2019-03-03 (×4): 2 g via INTRAVENOUS
  Filled 2019-03-01 (×6): qty 2

## 2019-03-01 NOTE — ED Notes (Signed)
Patient transported to CT 

## 2019-03-01 NOTE — ED Provider Notes (Signed)
Emergency Department Provider Note   I have reviewed the triage vital signs and the nursing notes.   HISTORY  Chief Complaint confusion/ post pneumonia   HPI Brandi Stephens is a 73 y.o. female with past medical history reviewed below presents to the emergency department with confusion in the setting of recent pneumonia diagnosis and treatment.  Patient has history of sepsis and states that she had a similar presentation prior to that diagnosis.  Patient states that several weeks ago she began to develop bronchitis type symptoms.  She was started on Cipro which did not improve her symptoms.  She had a negative COVID-19 test on October 8 during the Cipro course.  She was given steroid for approximately 1 week but unknown type or dose.  She did not get any respiratory symptom improvement from that so was started on additional abx (Septra) on 11/3 through her PCP and completed that 3 days ago.  Patient states that she was feeling well but family notes that she was sleeping late into the morning which is unusual.  When she woke up she was confusing her words and seemed to be slurring her speech.  She has a slight tremor in her hands which is also new.  She denies feeling chest pain, heart palpitations, shortness of breath.  Denies cough.  No headache or neck discomfort.  No vision changes.  No abdominal pain.  She did have 1 episode of vomiting in route to the emergency department but no diarrhea.  No other sick contacts.  Past Medical History:  Diagnosis Date  . AIN (anal intraepithelial neoplasia) anal canal   . Anemia    with pregnancy  . Anxiety   . Arthritis    knees, lower back, knees  . Chronic constipation   . Coarse tremors    Essential  . COPD (chronic obstructive pulmonary disease) ([place])    2017 chest CT, history of  . Degenerative lumbar spinal stenosis   . GERD (gastroesophageal reflux disease)    pt denies  . Gout   . Grade I diastolic dysfunction 0000000   Noted  ECHO  . Hemorrhoids   . History of adenomatous polyp of colon   . History of basal cell carcinoma (BCC) excision 2015   nose  . History of sepsis 05/14/2014   post lumbar surgery  . History of shingles    x2  . Hyperlipidemia   . Hypertension   . Hypothyroidism   . Irregular heart beat    history of while undergoing radiation  . Malignant neoplasm of upper-inner quadrant of left breast in female, estrogen receptor positive [facility]) oncologist-  dr Jana Hakim--  per lov in epic no recurrence   dx 07-13-2015  Left breast invasive ductal carcinoma, Stage IA, Grade 1 (TXN0),  09-16-2015  s/p  left breast lumpectomy with sln bx's,  completed radiation 11-18-2015,  started antiestrogen therapy 12-14-2015  . Neuropathy    Left foot and back of left leg  . Personal history of radiation therapy    completed 11-18-2015  left breast  . Pneumonia   . PONV (postoperative nausea and vomiting)   . Prediabetes    diet controlled, no med  . Restless leg syndrome   . Seasonal allergies   . Seizure ([place]) 05/2015   due to sepsis, only one time episode  . Tremor   . Wears partial dentures    lower     Patient Active Problem List   Diagnosis Date Noted  .  Paresthesia 02/12/2019  . Low back pain 02/12/2019  . Neuropathy 12/20/2018  . Lumbar spinal stenosis 06/29/2018  . Preoperative clearance 06/04/2018  . Abnormal CT of the chest 06/04/2018  . Smoker 05/17/2018  . History of COPD 05/17/2018  . Hypothyroidism 11/30/2017  . Thoracic aortic atherosclerosis ([place]) 09/14/2016  . Benign essential tremor 09/09/2016  . RLS (restless legs syndrome) 09/09/2016  . H/O adenomatous polyp of colon 07/06/2016  . PVC's (premature ventricular contractions) 03/29/2016  . Preoperative cardiovascular examination 03/29/2016  . Palpitations 01/11/2016  . Shortness of breath 01/11/2016  . Other fatigue 01/11/2016  . Former cigarette smoker 01/11/2016  . Tachycardia 01/11/2016  . Leg edema 01/11/2016  . Chronic  cough 10/28/2015  . 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 Malignant neoplasm of upper-inner quadrant of left breast in female, estrogen receptor positive ([place]) 07/14/2015  . Prediabetes 07/08/2015  . CKD (chronic kidney disease), stage III 07/08/2015  . Dependent edema 06/08/2015  . Sleep disturbance 06/08/2015  . Mixed hyperlipidemia 06/08/2015  . Generalized anxiety disorder 06/08/2015  . Essential hypertension 06/08/2015  . Emphysema lung ([place]) 06/08/2015  . Gout 06/08/2015  . Macrocytosis   . 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 Sepsis ([place]) 05/15/2015  . Lumbar spondylosis 03/20/2015  . Paresthesias 04/01/2013    Past Surgical History:  Procedure Laterality Date  . ABDOMINAL HYSTERECTOMY  1985  . BASAL CELL CARCINOMA EXCISION  10/15   nose  . BREAST BIOPSY Right 08/24/2015  . BREAST BIOPSY Right 07/13/2015  . BREAST BIOPSY Left 07/13/2015  . BREAST EXCISIONAL BIOPSY Left 1972  . BREAST LUMPECTOMY Left   . BREAST LUMPECTOMY WITH RADIOACTIVE SEED AND SENTINEL LYMPH NODE BIOPSY Left 09/16/2015   Procedure: BREAST LUMPECTOMY WITH RADIOACTIVE SEED AND SENTINEL LYMPH NODE BIOPSY;  Surgeon: Stark Klein, MD;  Location: [facility];  Service: General;  Laterality: Left;  . BREAST REDUCTION SURGERY Bilateral 1998  . CATARACT EXTRACTION W/ INTRAOCULAR LENS IMPLANT Left 2016  . [place]   hallo  . COLONOSCOPY  01/2013   polyps  . EYE SURGERY Left 2016  . HAND SURGERY Left 02-19-2002   dr Fredna Dow  @[facility]    repair collateral ligament/ MPJ left thumb  . [place]  2008  . HIGH RESOLUTION ANOSCOPY N/A 02/28/2018   Procedure: HIGH RESOLUTION ANOSCOPY WITH BIOPSY;  Surgeon: Leighton Ruff, MD;  Location: [facility];  Service: General;  Laterality: N/A;  . KNEE ARTHROSCOPY Right 2002  . LUMBAR SPINE SURGERY  03-20-2015   dr Kathyrn Sheriff   fusion L4-5, L5-S1  . MOUTH SURGERY  07/14/2018   2 infected teeth removed  . MULTIPLE  TOOTH EXTRACTIONS     with bone grafting lower bottom right and left  . REDUCTION MAMMAPLASTY Bilateral   . RIGHT COLECTOMY  09-10-2003   dr Margot Chimes @[facility]    multiple colon polyps (per path tubular adenoma's, hyperplastic , benign appendix, benign two lymph nodes  . ROTATOR CUFF REPAIR Left 04/2016  . TONSILLECTOMY  1969  . TUBAL LIGATION Bilateral 1982    Allergies Ampicillin, Penicillins, Gabapentin, Morphine and related, Other, Codeine, Hydrocodone-acetaminophen, and Hydromorphone  Family History  Problem Relation Age of Onset  . Atrial fibrillation Mother   . Ulcers Father   . Breast cancer Maternal Aunt   . Lung cancer Maternal Uncle   . Stomach cancer Maternal Grandmother   . Lung cancer Maternal Aunt   . Brain cancer Maternal Aunt   . Prostate cancer Maternal Grandfather   .  Stroke Paternal Grandmother   . Tuberculosis Paternal Grandfather   . Colon cancer Neg Hx   . Esophageal cancer Neg Hx   . Rectal cancer Neg Hx     Social History Social History   Tobacco Use  . Smoking status: Former Smoker    Packs/day: 0.50    Years: 35.00    Pack years: 17.50    Types: Cigarettes    Quit date: 05/22/2018    Years since quitting: 0.7  . Smokeless tobacco: Never Used  Substance Use Topics  . Alcohol use: Yes    Comment: 10-12 drinks per week  . Drug use: Never    Review of Systems  Constitutional: No fever/chills. Positive confusion and mixing up words.  Eyes: No visual changes. ENT: No sore throat. Cardiovascular: Denies chest pain. Respiratory: Denies shortness of breath. Gastrointestinal: No abdominal pain. Positive nausea and vomiting.  No diarrhea.  No constipation. Genitourinary: Negative for dysuria. Musculoskeletal: Negative for back pain. Skin: Negative for rash. Neurological: Negative for headaches, focal weakness or numbness. Positive bilateral hand tremor.   10-point ROS otherwise negative.  ____________________________________________   PHYSICAL  EXAM:  VITAL SIGNS: ED Triage Vitals  Enc Vitals Group     BP 03/01/19 1658 136/86     Pulse Rate 03/01/19 1658 (!) 103     Resp 03/01/19 1658 18     Temp 03/01/19 1658 98.3 F (36.8 C)     Temp Source 03/01/19 1658 Oral     SpO2 03/01/19 1658 96 %   Constitutional: Alert and oriented. Well appearing and in no acute distress. Eyes: Conjunctivae are normal. Head: Atraumatic. Nose: No congestion/rhinnorhea. Mouth/Throat: Mucous membranes are moist.  Neck: No stridor.  Cardiovascular: Tachycardia. Good peripheral circulation. Grossly normal heart sounds.   Respiratory: Normal respiratory effort.  No retractions. Lungs CTAB. Gastrointestinal: Soft and nontender. No distention.  Musculoskeletal: No lower extremity tenderness nor edema. No gross deformities of extremities. Neurologic:  Normal speech but occasionally mixing up words or saying the wrong words. No gross focal neurologic deficits are appreciated. CN exam 2-12 normal.  Skin:  Skin is warm, dry and intact. No rash noted.   ____________________________________________   LABS (all labs ordered are listed, but only abnormal results are displayed)  Labs Reviewed  LACTIC ACID, PLASMA - Abnormal; Notable for the following components:      Result Value   Lactic Acid, Venous 2.4 (*)    All other components within normal limits  LACTIC ACID, PLASMA - Abnormal; Notable for the following components:   Lactic Acid, Venous 2.2 (*)    All other components within normal limits  COMPREHENSIVE METABOLIC PANEL - Abnormal; Notable for the following components:   Potassium 5.5 (*)    CO2 19 (*)    Glucose, Bld 129 (*)    BUN 36 (*)    Creatinine, Ser 1.65 (*)    GFR calc non Af Amer 31 (*)    GFR calc Af Amer 36 (*)    All other components within normal limits  CBC WITH DIFFERENTIAL/PLATELET - Abnormal; Notable for the following components:   RBC 3.60 (*)    Hemoglobin 11.9 (*)    MCV 102.8 (*)    Abs Immature Granulocytes 0.12  (*)    All other components within normal limits  CBC WITH DIFFERENTIAL/PLATELET - Abnormal; Notable for the following components:   RBC 3.68 (*)    Hemoglobin 11.9 (*)    MCV 103.0 (*)    Abs Immature Granulocytes  0.11 (*)    All other components within normal limits  CULTURE, BLOOD (ROUTINE X 2)  CULTURE, BLOOD (ROUTINE X 2)  URINE CULTURE  SARS CORONAVIRUS 2 (TAT 6-24 HRS)  APTT  PROTIME-INR  AMMONIA  TSH  URINALYSIS, ROUTINE W REFLEX MICROSCOPIC   ____________________________________________  EKG   EKG Interpretation  Date/Time:  Friday March 01 2019 20:10:59 EST Ventricular Rate:  92 PR Interval:    QRS Duration: 112 QT Interval:  356 QTC Calculation: 441 R Axis:   33 Text Interpretation: Sinus rhythm Borderline intraventricular conduction delay Nonspecific T abnrm, anterolateral leads No STEMI Confirmed by Nanda Quinton [id number]) on 03/01/2019 8:24:01 PM       ____________________________________________  RADIOLOGY  Dg Chest 2 View  Result Date: 03/01/2019 CLINICAL DATA:  Possible sepsis. EXAM: CHEST - 2 VIEW COMPARISON:  02/19/2019 FINDINGS: The lungs are clear without focal pneumonia, edema, pneumothorax or pleural effusion. Lungs are hyperexpanded suggesting emphysema. The cardiopericardial silhouette is within normal limits for size. IMPRESSION: No active cardiopulmonary disease. Electronically Signed   By: Misty Stanley M.D.   On: 03/01/2019 17:31   Ct Head Wo Contrast  Result Date: 03/01/2019 CLINICAL DATA:  Altered level of consciousness. EXAM: CT HEAD WITHOUT CONTRAST TECHNIQUE: Contiguous axial images were obtained from the base of the skull through the vertex without intravenous contrast. COMPARISON:  05/17/2015 FINDINGS: Brain: Stable age related cerebral atrophy, ventriculomegaly and advanced periventricular white matter disease. No extra-axial fluid collections are identified. No CT findings for acute hemispheric infarction or intracranial  hemorrhage. No mass lesions. The brainstem and cerebellum are normal. Vascular: No hyperdense vessels or aneurysm. Stable vascular calcifications. Skull: No skull fracture or bone lesions. Sinuses/Orbits: The paranasal sinuses and mastoid air cells are clear. The globes are intact. Other: No scalp lesions or hematoma. IMPRESSION: 1. No acute intracranial findings or mass lesions. 2. Stable appearing age advanced periventricular white matter disease. Electronically Signed   By: Marijo Sanes M.D.   On: 03/01/2019 19:39    ____________________________________________   PROCEDURES  Procedure(s) performed:   Procedures  CRITICAL CARE Performed by: Margette Fast Total critical care time: 35 minutes Critical care time was exclusive of separately billable procedures and treating other patients. Critical care was necessary to treat or prevent imminent or life-threatening deterioration. Critical care was time spent personally by me on the following activities: development of treatment plan with patient and/or surrogate as well as nursing, discussions with consultants, evaluation of patient's response to treatment, examination of patient, obtaining history from patient or surrogate, ordering and performing treatments and interventions, ordering and review of laboratory studies, ordering and review of radiographic studies, pulse oximetry and re-evaluation of patient's condition.  Nanda Quinton, MD Emergency Medicine  ____________________________________________   INITIAL IMPRESSION / ASSESSMENT AND PLAN / ED COURSE  Pertinent labs & imaging results that were available during my care of the patient were reviewed by me and considered in my medical decision making (see chart for details).   Patient presents to the emergency department for evaluation of confusion in the setting of recent diagnosis and treatment of pneumonia.  No pneumonia on chest x-ray.  No hypoxemia.  Patient does have slightly  elevated lactic acid and tachycardia on arrival.  She does have history of bacteremia and sepsis which presented in a similar way several years ago.  We will send Covid test as well as sepsis labs including cultures.  Will start broad-spectrum antibiotics with elevated lactate. Will send for CT head as well with  CVA a possibility.  Labs with continued mild elevation in lactate.  Reviewed the patient's discharge summary from 2017.  At that time she was found to have possible pneumonia with altered mental status/delirium.  Some of the delirium thought to be related to alcohol withdrawal per that note.  Patient tells me that she drinks 1 drink with her husband every night but not to excess.  I have added on a CIWA.  CT head is unremarkable.  Lab work other than elevated lactate is largely unremarkable.  UA is pending.  I have added TSH and MRI brain to evaluate for possible stroke. Continue abx for now but lower infection concern clinically without fever or other constitutional symptoms at home.   Discussed patient's case with [facility] to request admission. Patient and family (if present) updated with plan. Care transferred to [facility] service.  I reviewed all nursing notes, vitals, pertinent old records, EKGs, labs, imaging (as available).  ____________________________________________  FINAL CLINICAL IMPRESSION(S) / ED DIAGNOSES  Final diagnoses:  Altered mental status, unspecified altered mental status type     MEDICATIONS GIVEN DURING THIS VISIT:  Medications  LORazepam (ATIVAN) injection 1 mg (has no administration in time range)  vancomycin (VANCOCIN) 1,750 mg in sodium chloride 0.9 % 500 mL IVPB (has no administration in time range)  aztreonam (AZACTAM) 2 g in sodium chloride 0.9 % 100 mL IVPB (has no administration in time range)  sodium chloride flush (NS) 0.9 % injection 10-40 mL (has no administration in time range)  sodium chloride flush (NS) 0.9 % injection 3 mL (3 mLs Intravenous Given  03/01/19 1846)  aztreonam (AZACTAM) 2 g in sodium chloride 0.9 % 100 mL IVPB (0 g Intravenous Stopped 03/01/19 2055)  metroNIDAZOLE (FLAGYL) IVPB 500 mg (0 mg Intravenous Stopped 03/01/19 2207)  vancomycin (VANCOCIN) 2,000 mg in sodium chloride 0.9 % 500 mL IVPB ( Intravenous Restarted 03/01/19 2207)  sodium chloride 0.9 % bolus 1,000 mL (1,000 mLs Intravenous New Bag/Given 03/01/19 2207)    Note:  This document was prepared using Dragon voice recognition software and may include unintentional dictation errors.  Nanda Quinton, MD, [facility] Emergency Medicine    Long, [facility], MD 03/01/19 [phone]

## 2019-03-01 NOTE — ED Triage Notes (Signed)
PT was septic 4 years ago and onset was associated with confusion. Post back surgery.     pneumonia recently. PT is altered as of yesterday. Mild confusion and difficulty following conversations yesterday. More confusion today. New tremor in hands.

## 2019-03-01 NOTE — ED Triage Notes (Signed)
Changes in VS and complaint reported to Loura Halt NP. Patient instructed to go to [facility] for workup. Daughter elects to drive her there.

## 2019-03-01 NOTE — Progress Notes (Signed)
Pharmacy Antibiotic Note  Brandi Stephens is a 73 y.o. female admitted on 03/01/2019 with recent PNA and now concern for sepsis. Pharmacy has been consulted for Vancomycin & Azactam dosing along with Flagyl per MD.   The patient has a history of severe hives with penicillins and not records of cephalosporin use outside of being given at the same time of the PCN reaction. SCr 1.65, CrCl 40 ml/min  Plan: - Vancomycin 2g IV x 1 followed by 1750 mg IV every 48 hours (est AUC 509, SCr 1.65, Vd 0.5) - Azactam wg IV x 1 followed by 2g IV every 8 hours - Flagyl per MD - Will continue to follow renal function, culture results, LOT, and antibiotic de-escalation plans      Temp (24hrs), Avg:98.8 F (37.1 C), Min:98.3 F (36.8 C), Max:99 F (37.2 C)  Recent Labs  Lab 03/01/19 1718  LATICACIDVEN 2.4*    CrCl cannot be calculated (Patient's most recent lab result is older than the maximum 21 days allowed.).    Allergies  Allergen Reactions  . Ampicillin Hives and Other (See Comments)    Severe reaction in February 2017 1.5 month to have hives to go away  . Penicillins Other (See Comments)    Severe reaction in February 2017 1.5 month to have hives to go away Has patient had a PCN reaction causing immediate rash, facial/tongue/throat swelling, SOB or lightheadedness with hypotension: #  #  #  YES  #  #  #  Has patient had a PCN reaction causing severe rash involving mucus membranes or skin necrosis: No Has patient had a PCN reaction that required hospitalization No Has patient had a PCN reaction occurring within the last 10 years: No   . Gabapentin Swelling  . Morphine And Related Itching    Patient states she is not allergic to morphine  . Other Itching    UNSPECIFIED Analgesics  . Codeine Nausea And Vomiting  . Hydrocodone-Acetaminophen Itching  . Hydromorphone Itching    Pt has taken w/o issues    Antimicrobials this admission: Vanc 11/13 >> Azactam 11/13 >> Flagyl 11/13  >>  Dose adjustments this admission: n/a  Microbiology results: 11/13 BCx >> 11/13 UCx >> 11/13 COVID >>  Thank you for allowing pharmacy to be a part of this patient's care.  Alycia Rossetti, PharmD, BCPS Clinical Pharmacist Clinical phone for 03/01/2019: [phone] 03/01/2019 7:06 PM   **Pharmacist phone directory can now be found on [email or web address] ([email or web address]).  Listed under [place].

## 2019-03-01 NOTE — ED Triage Notes (Signed)
Patient in ED to be evaluated for possible sepsis, hx of same. Patient states recent pneumonia and completed antibiotics. Patient states that she has confusion and had similar with previous infection. On assessment alert and oriented

## 2019-03-02 ENCOUNTER — Inpatient Hospital Stay (HOSPITAL_COMMUNITY): Payer: PPO

## 2019-03-02 DIAGNOSIS — A419 Sepsis, unspecified organism: Principal | ICD-10-CM

## 2019-03-02 DIAGNOSIS — R4182 Altered mental status, unspecified: Secondary | ICD-10-CM

## 2019-03-02 LAB — PROTIME-INR
INR: 1 (ref 0.8–1.2)
Prothrombin Time: 13.5 seconds (ref 11.4–15.2)

## 2019-03-02 LAB — BASIC METABOLIC PANEL
Anion gap: 11 (ref 5–15)
BUN: 30 mg/dL — ABNORMAL HIGH (ref 8–23)
CO2: 19 mmol/L — ABNORMAL LOW (ref 22–32)
Calcium: 9.4 mg/dL (ref 8.9–10.3)
Chloride: 110 mmol/L (ref 98–111)
Creatinine, Ser: 1.39 mg/dL — ABNORMAL HIGH (ref 0.44–1.00)
GFR calc Af Amer: 44 mL/min — ABNORMAL LOW (ref >60)
GFR calc non Af Amer: 38 mL/min — ABNORMAL LOW (ref >60)
Glucose, Bld: 111 mg/dL — ABNORMAL HIGH (ref 70–99)
Potassium: 5.1 mmol/L (ref 3.5–5.1)
Sodium: 140 mmol/L (ref 135–145)

## 2019-03-02 LAB — SARS CORONAVIRUS 2 (TAT 6-24 HRS): SARS Coronavirus 2: NEGATIVE

## 2019-03-02 LAB — LACTIC ACID, PLASMA: Lactic Acid, Venous: 1.3 mmol/L (ref 0.5–1.9)

## 2019-03-02 LAB — CBC WITH DIFFERENTIAL/PLATELET
Abs Immature Granulocytes: 0.09 10*3/uL — ABNORMAL HIGH (ref 0.00–0.07)
Basophils Absolute: 0.1 10*3/uL (ref 0.0–0.1)
Basophils Relative: 1 %
Eosinophils Absolute: 0.3 10*3/uL (ref 0.0–0.5)
Eosinophils Relative: 3 %
HCT: 33.7 % — ABNORMAL LOW (ref 36.0–46.0)
Hemoglobin: 10.6 g/dL — ABNORMAL LOW (ref 12.0–15.0)
Immature Granulocytes: 1 %
Lymphocytes Relative: 28 %
Lymphs Abs: 2.5 10*3/uL (ref 0.7–4.0)
MCH: 32.4 pg (ref 26.0–34.0)
MCHC: 31.5 g/dL (ref 30.0–36.0)
MCV: 103.1 fL — ABNORMAL HIGH (ref 80.0–100.0)
Monocytes Absolute: 0.7 10*3/uL (ref 0.1–1.0)
Monocytes Relative: 8 %
Neutro Abs: 5.3 10*3/uL (ref 1.7–7.7)
Neutrophils Relative %: 59 %
Platelets: 287 10*3/uL (ref 150–400)
RBC: 3.27 MIL/uL — ABNORMAL LOW (ref 3.87–5.11)
RDW: 13.6 % (ref 11.5–15.5)
WBC: 8.9 10*3/uL (ref 4.0–10.5)
nRBC: 0 % (ref 0.0–0.2)

## 2019-03-02 LAB — URINALYSIS, ROUTINE W REFLEX MICROSCOPIC
Bilirubin Urine: NEGATIVE
Glucose, UA: NEGATIVE mg/dL
Hgb urine dipstick: NEGATIVE
Ketones, ur: NEGATIVE mg/dL
Leukocytes,Ua: NEGATIVE
Nitrite: NEGATIVE
Protein, ur: NEGATIVE mg/dL
Specific Gravity, Urine: 1.021 (ref 1.005–1.030)
pH: 5 (ref 5.0–8.0)

## 2019-03-02 LAB — CORTISOL-AM, BLOOD: Cortisol - AM: 10.7 ug/dL (ref 6.7–22.6)

## 2019-03-02 LAB — PROCALCITONIN: Procalcitonin: <0.1 ng/mL

## 2019-03-02 LAB — MRSA PCR SCREENING: MRSA by PCR: NEGATIVE

## 2019-03-02 LAB — MAGNESIUM: Magnesium: 1.8 mg/dL (ref 1.7–2.4)

## 2019-03-02 MED ORDER — ACETAMINOPHEN 325 MG PO TABS
650.0000 mg | ORAL_TABLET | Freq: Four times a day (QID) | ORAL | Status: DC | PRN
  Administered 2019-03-04: 650 mg via ORAL
  Filled 2019-03-02 (×2): qty 2

## 2019-03-02 NOTE — Progress Notes (Signed)
PROGRESS NOTE  Brandi Stephens [id number] DOB: 1945/05/04 DOA: 03/01/2019 PCP: Girtha Rm, NP-C  HPI/Recap of past 24 hours: HPI from Dr Birdie Riddle is a 73 y.o. female with medical history significant of status post breast cancer, anxiety, HTN, HLD, COPD, GERD, gout, presents with AMS, confusion which sharply improved in the ED.  Of note, patient has been treated twice in the outpatient setting for pneumonia, and had been battling pneumonialike symptoms for the past couple of months.  Patient was treated for several weeks with Cipro, then had a repeat chest x-ray on 02/19/2019 which showed bilateral pneumonia and was treated with Bactrim.  Patient also has tested negative for Covid twice.  In the ED, mentation improved, patient was noted to be tachypneic, tachycardic, CT head/MRI unremarkable, labs showed mild lactic acidosis, CT chest showed possible atypical infection like Mycobacterium avium.  Patient admitted for sepsis management.    Today, patient still appears somewhat confused, denies any worsening cough, shortness of breath, fever/chills, abdominal pain, nausea/vomiting.  Reports persistent back pain, although not worsening.  Spoke to the daughter over the phone who was at bedside, reported patient is still confused, and this was the way she presented the last time she had sepsis about 3 years ago.     Assessment/Plan: Active Problems:   Sepsis ([place])   Mixed hyperlipidemia   Generalized anxiety disorder   Essential hypertension   Gout   Prediabetes   CKD (chronic kidney disease), stage III   Hypothyroidism  Possible sepsis Unknown etiology Tachycardic, tachypneic, with lactic acidosis on presentation Currently afebrile, with no leukocytosis Lactic acidosis currently resolved Procalcitonin negative COVID-19 negative UA unremarkable, UC pending BC x2 pending Urine for Legionella/strep pending Chest x-ray with hyperexpanded lungs suggesting  emphysema, with no focal pneumonia CT chest without contrast showed areas of scarring and tree-in-bud nodularity in the right middle lobe and lingula, which favor atypical indolent infection such as Mycobacterium avium Spoke to ID Dr.Comer on 03/02/2019, who recommended getting AFB sputum collections if possible, as patient is currently not coughing, recommend outpatient follow-up with ID.  ID will refer patient if needed, to pulmonology for possible bronchoscopy as an outpatient Continue aztreonam, vancomycin for now, will order MRSA PCR, if negative will DC vancomycin Monitor closely  Acute metabolic encephalopathy Ongoing Likely 2/2 above vs polypharmacy CT head negative MRI brain unremarkable Management as above  Hypertension Continue Toprol  CKD stage IIIb Baseline creatinine 1.3-1.6 Creatinine at baseline Daily BMP  Anemia of chronic kidney disease Hemoglobin around baseline Daily CBC  Hyperlipidemia Continue Lipitor  Hypothyroidism Continue Synthroid  Gout Continue allopurinol  Status post breast cancer Continue anastrozole  Restless leg syndrome Continue primidone, ropinirole  Anxiety/depression Continue BuSpar, Cymbalta, hydroxyzine  ??  Daily alcohol use ??  Alcohol abuse, has a shot of vodka daily Monitor closely, may need CIWA protocol        Malnutrition Type:      Malnutrition Characteristics:      Nutrition Interventions:       Estimated body mass index is 39.26 kg/m as calculated from the following:   Height as of 02/12/19: 5' 7.5" (1.715 m).   Weight as of 02/19/19: 115.4 kg.     Code Status: Full  Family Communication: Discussed extensively with daughter on 03/02/2019  Disposition Plan: To be determined   Consultants:  Spoke to ID, Dr. Talbot Grumbling on 03/02/2019  Procedures:  None  Antimicrobials:  Aztreonam  Vancomycin  DVT prophylaxis: Lovenox  Objective: Vitals:   03/01/19 2100 03/02/19 0014 03/02/19  0030 03/02/19 0450  BP: 134/68 (!) 150/80 (!) 162/94 (!) 153/85  Pulse: 90 96 96 (!) 103  Resp: (!) 24 18 (!) 24 18  Temp:  98.3 F (36.8 C)    TempSrc:  Oral    SpO2: 95% 97% 96% 96%    Intake/Output Summary (Last 24 hours) at 03/02/2019 1126 Last data filed at 03/02/2019 [id number] Gross per 24 hour  Intake 977.39 ml  Output --  Net 977.39 ml   There were no vitals filed for this visit.  Exam:  General: NAD, pleasant, oriented  Cardiovascular: S1, S2 present  Respiratory:  Diminished breath sounds bilaterally  Abdomen: Soft, nontender, nondistended, bowel sounds present  Musculoskeletal: No bilateral pedal edema noted  Skin: Normal  Psychiatry: Normal mood    Data Reviewed: CBC: Recent Labs  Lab 03/01/19 1718 03/01/19 1900 03/02/19 0550  WBC 9.0 9.7 8.9  NEUTROABS 6.4 7.0 5.3  HGB 11.9* 11.9* 10.6*  HCT 37.9 37.0 33.7*  MCV 103.0* 102.8* 103.1*  PLT 322 301 [id number]   Basic Metabolic Panel: Recent Labs  Lab 03/01/19 1900 03/02/19 0550  NA 136 140  K 5.5* 5.1  CL 106 110  CO2 19* 19*  GLUCOSE 129* 111*  BUN 36* 30*  CREATININE 1.65* 1.39*  CALCIUM 9.5 9.4  MG  --  1.8   GFR: Estimated Creatinine Clearance: 48.4 mL/min (A) (by C-G formula based on SCr of 1.39 mg/dL (H)). Liver Function Tests: Recent Labs  Lab 03/01/19 1900  AST 21  ALT 24  ALKPHOS 87  BILITOT 0.8  PROT 7.0  ALBUMIN 4.1   No results for input(s): LIPASE, AMYLASE in the last 168 hours. Recent Labs  Lab 03/01/19 1900  AMMONIA 28   Coagulation Profile: Recent Labs  Lab 03/01/19 1854 03/02/19 0550  INR 1.0 1.0   Cardiac Enzymes: No results for input(s): CKTOTAL, CKMB, CKMBINDEX, TROPONINI in the last 168 hours. BNP (last 3 results) No results for input(s): PROBNP in the last 8760 hours. HbA1C: No results for input(s): HGBA1C in the last 72 hours. CBG: No results for input(s): GLUCAP in the last 168 hours. Lipid Profile: No results for input(s): CHOL, HDL, LDLCALC,  TRIG, CHOLHDL, LDLDIRECT in the last 72 hours. Thyroid Function Tests: Recent Labs    03/01/19 2034  TSH 2.346   Anemia Panel: No results for input(s): VITAMINB12, FOLATE, FERRITIN, TIBC, IRON, RETICCTPCT in the last 72 hours. Urine analysis:    Component Value Date/Time   COLORURINE YELLOW 03/02/2019 0024   APPEARANCEUR CLEAR 03/02/2019 0024   LABSPEC 1.021 03/02/2019 0024   LABSPEC 1.025 12/13/2018 1153   PHURINE 5.0 03/02/2019 0024   GLUCOSEU NEGATIVE 03/02/2019 0024   HGBUR NEGATIVE 03/02/2019 0024   BILIRUBINUR NEGATIVE 03/02/2019 0024   BILIRUBINUR negative 12/13/2018 1153   BILIRUBINUR n 11/25/2015 1712   KETONESUR NEGATIVE 03/02/2019 0024   PROTEINUR NEGATIVE 03/02/2019 0024   UROBILINOGEN negative 11/25/2015 1712   NITRITE NEGATIVE 03/02/2019 0024   LEUKOCYTESUR NEGATIVE 03/02/2019 0024   Sepsis Labs: @LABRCNTIP (procalcitonin:4,lacticidven:4)  ) Recent Results (from the past 240 hour(s))  SARS CORONAVIRUS 2 (TAT 6-24 HRS) Nasopharyngeal Nasopharyngeal Swab     Status: None   Collection Time: 03/01/19  6:55 PM   Specimen: Nasopharyngeal Swab  Result Value Ref Range Status   SARS Coronavirus 2 NEGATIVE NEGATIVE Final    Comment: (NOTE) SARS-CoV-2 target nucleic acids are NOT DETECTED. The SARS-CoV-2 RNA is generally detectable in upper  and lower respiratory specimens during the acute phase of infection. Negative results do not preclude SARS-CoV-2 infection, do not rule out co-infections with other pathogens, and should not be used as the sole basis for treatment or other patient management decisions. Negative results must be combined with clinical observations, patient history, and epidemiological information. The expected result is Negative. Fact Sheet for Patients: SugarRoll.be Fact Sheet for Healthcare Providers: https://www.woods-mathews.com/ This test is not yet approved or cleared by the [place] FDA and    has been authorized for detection and/or diagnosis of SARS-CoV-2 by FDA under an Emergency Use Authorization (EUA). This EUA will remain  in effect (meaning this test can be used) for the duration of the COVID-19 declaration under Section 56 4(b)(1) of the Act, 21 U.S.C. section 360bbb-3(b)(1), unless the authorization is terminated or revoked sooner. Performed at [facility], [place] [address], [place], [place] [address]       Studies: Dg Chest 2 View  Result Date: 03/01/2019 CLINICAL DATA:  Possible sepsis. EXAM: CHEST - 2 VIEW COMPARISON:  02/19/2019 FINDINGS: The lungs are clear without focal pneumonia, edema, pneumothorax or pleural effusion. Lungs are hyperexpanded suggesting emphysema. The cardiopericardial silhouette is within normal limits for size. IMPRESSION: No active cardiopulmonary disease. Electronically Signed   By: Misty Stanley M.D.   On: 03/01/2019 17:31   Ct Head Wo Contrast  Result Date: 03/01/2019 CLINICAL DATA:  Altered level of consciousness. EXAM: CT HEAD WITHOUT CONTRAST TECHNIQUE: Contiguous axial images were obtained from the base of the skull through the vertex without intravenous contrast. COMPARISON:  05/17/2015 FINDINGS: Brain: Stable age related cerebral atrophy, ventriculomegaly and advanced periventricular white matter disease. No extra-axial fluid collections are identified. No CT findings for acute hemispheric infarction or intracranial hemorrhage. No mass lesions. The brainstem and cerebellum are normal. Vascular: No hyperdense vessels or aneurysm. Stable vascular calcifications. Skull: No skull fracture or bone lesions. Sinuses/Orbits: The paranasal sinuses and mastoid air cells are clear. The globes are intact. Other: No scalp lesions or hematoma. IMPRESSION: 1. No acute intracranial findings or mass lesions. 2. Stable appearing age advanced periventricular white matter disease. Electronically Signed   By: Marijo Sanes M.D.   On: 03/01/2019 19:39    Ct Chest Wo Contrast  Result Date: 03/01/2019 CLINICAL DATA:  Exertional shortness of breath EXAM: CT CHEST WITHOUT CONTRAST TECHNIQUE: Multidetector CT imaging of the chest was performed following the standard protocol without IV contrast. COMPARISON:  06/01/2018 FINDINGS: Cardiovascular: Scattered coronary artery and aortic atherosclerosis. Heart is normal size. Aorta is normal caliber. Mediastinum/Nodes: No mediastinal, hilar, or axillary adenopathy. Lungs/Pleura: Moderate centrilobular emphysema. Areas of scarring in the lingula and right middle lobe. Tree-in-bud nodular densities noted in the lingula and right middle lobe. No effusions. Upper Abdomen: Imaging into the upper abdomen shows no acute findings. Musculoskeletal: Chest wall soft tissues are unremarkable. No acute bony abnormality. IMPRESSION: Scattered coronary artery and aortic calcifications. 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 Aortic Atherosclerosis (ICD10-I70.0). Electronically Signed   By: Rolm Baptise M.D.   On: 03/01/2019 23:06   Mr Brain Wo Contrast  Result Date: 03/02/2019 CLINICAL DATA:  Encephalopathy. Pneumonia. EXAM: MRI HEAD WITHOUT CONTRAST TECHNIQUE: Multiplanar, multiecho pulse sequences of the brain and surrounding structures were obtained without intravenous contrast. COMPARISON:  Head CT 03/01/2019 FINDINGS: BRAIN: There is no acute infarct, acute hemorrhage or extra-axial collection. Diffuse confluent hyperintense T2-weighted signal within the  periventricular, deep and juxtacortical white matter, most commonly due to chronic ischemic microangiopathy. There is generalized atrophy without lobar predilection. The midline structures are normal. VASCULAR: The major intracranial arterial and venous sinus flow voids are normal. Susceptibility-sensitive sequences show no chronic microhemorrhage or superficial  siderosis. SKULL AND UPPER CERVICAL SPINE: Calvarial bone marrow signal is normal. There is no skull base mass. The visualized upper cervical spine and soft tissues are normal. SINUSES/ORBITS: There are no fluid levels or advanced mucosal thickening. The mastoid air cells and middle ear cavities are free of fluid. The orbits are normal. IMPRESSION: 1. No acute intracranial abnormality. 2. Advanced chronic small vessel disease. 3. Generalized atrophy without lobar predilection. Electronically Signed   By: Ulyses Jarred M.D.   On: 03/02/2019 01:41    Scheduled Meds:  allopurinol  300 mg Oral Daily   anastrozole  1 mg Oral Daily   atorvastatin  20 mg Oral Daily   busPIRone  15 mg Oral TID   diclofenac sodium  4 g Topical QID   DULoxetine  60 mg Oral Daily   enoxaparin (LOVENOX) injection  40 mg Subcutaneous Q24H   levothyroxine  75 mcg Oral Q0600   magnesium oxide  400 mg Oral Daily   Melatonin  9 mg Oral QHS   metoprolol succinate  12.5 mg Oral QHS   primidone  50 mg Oral QHS   rOPINIRole  0.5 mg Oral QHS   sodium chloride flush  3 mL Intravenous Q12H   vitamin B-12  1,000 mcg Oral Daily    Continuous Infusions:  sodium chloride 100 mL/hr at 03/02/19 0307   sodium chloride     aztreonam 2 g (03/02/19 0527)   [START ON 03/03/2019] vancomycin       LOS: 1 day     Alma Friendly, MD [facility]  If 7PM-7AM, please contact night-coverage [email or web address] 03/02/2019, 11:26 AM

## 2019-03-02 NOTE — H&P (Signed)
History and Physical    Brandi Stephens [id number] DOB: 03-23-1946 DOA: 03/01/2019  PCP: Girtha Rm, NP-C    Patient coming from: Home    Chief Complaint: Change in mental status confusion slurred speech  HPI: Brandi Stephens is a 73 y.o. female with medical history significant of status post breast cancer, anxiety, hypertension, hypercholesterolemia, COPD, GERD, gout, came with a chief complaint change in mental status confusion slurred speech which did improve in the emergency room.  She she denies any fever chills productive cough headache She is treated for several weeks for?  Pneumonia-bronchitis She was treated first with Cipro then with Bactrim with no improvement She had a chest x-ray on 02/19/2019 which show bilateral pneumonia and treated with Bactrim She had a negative COVID-19 on October 8 during the Cipro course On physical exam she denies any stiff neck or back pain.  ED Course:  Change in mental status improved in the emergency room after IV fluids started antibiotic CT scan of the head negative MRI results pending Patient had sepsis criteria Heart rate more than 90 respiratory rate 24, lactic acid 2.4 Not sure the source possible pneumonia Patient was started empirically on Azactam and vancomycin Blood cultures and septic protocol implied  Review of Systems: As per HPI otherwise 10 point review of systems negative.  Except change in mental status Vomited once  Past Medical History:  Diagnosis Date  . AIN (anal intraepithelial neoplasia) anal canal   . Anemia    with pregnancy  . Anxiety   . Arthritis    knees, lower back, knees  . Chronic constipation   . Coarse tremors    Essential  . COPD (chronic obstructive pulmonary disease) ([place])    2017 chest CT, history of  . Degenerative lumbar spinal stenosis   . GERD (gastroesophageal reflux disease)    pt denies  . Gout   . Grade I diastolic dysfunction 0000000   Noted ECHO  . Hemorrhoids    . History of adenomatous polyp of colon   . History of basal cell carcinoma (BCC) excision 2015   nose  . History of sepsis 05/14/2014   post lumbar surgery  . History of shingles    x2  . Hyperlipidemia   . Hypertension   . Hypothyroidism   . Irregular heart beat    history of while undergoing radiation  . Malignant neoplasm of upper-inner quadrant of left breast in female, estrogen receptor positive [facility]) oncologist-  dr Jana Hakim--  per lov in epic no recurrence   dx 07-13-2015  Left breast invasive ductal carcinoma, Stage IA, Grade 1 (TXN0),  09-16-2015  s/p  left breast lumpectomy with sln bx's,  completed radiation 11-18-2015,  started antiestrogen therapy 12-14-2015  . Neuropathy    Left foot and back of left leg  . Personal history of radiation therapy    completed 11-18-2015  left breast  . Pneumonia   . PONV (postoperative nausea and vomiting)   . Prediabetes    diet controlled, no med  . Restless leg syndrome   . Seasonal allergies   . Seizure ([place]) 05/2015   due to sepsis, only one time episode  . Tremor   . Wears partial dentures    lower     Past Surgical History:  Procedure Laterality Date  . ABDOMINAL HYSTERECTOMY  1985  . BASAL CELL CARCINOMA EXCISION  10/15   nose  . BREAST BIOPSY Right 08/24/2015  . BREAST BIOPSY Right 07/13/2015  .  BREAST BIOPSY Left 07/13/2015  . BREAST EXCISIONAL BIOPSY Left 1972  . BREAST LUMPECTOMY Left   . BREAST LUMPECTOMY WITH RADIOACTIVE SEED AND SENTINEL LYMPH NODE BIOPSY Left 09/16/2015   Procedure: BREAST LUMPECTOMY WITH RADIOACTIVE SEED AND SENTINEL LYMPH NODE BIOPSY;  Surgeon: Stark Klein, MD;  Location: [facility];  Service: General;  Laterality: Left;  . BREAST REDUCTION SURGERY Bilateral 1998  . CATARACT EXTRACTION W/ INTRAOCULAR LENS IMPLANT Left 2016  . [place]   hallo  . COLONOSCOPY  01/2013   polyps  . EYE SURGERY Left 2016  . HAND SURGERY Left 02-19-2002   dr Fredna Dow  @[facility]     repair collateral ligament/ MPJ left thumb  . [place]  2008  . HIGH RESOLUTION ANOSCOPY N/A 02/28/2018   Procedure: HIGH RESOLUTION ANOSCOPY WITH BIOPSY;  Surgeon: Leighton Ruff, MD;  Location[facility];  Service: General;  Laterality: N/A;  . KNEE ARTHROSCOPY Right 2002  . LUMBAR SPINE SURGERY  03-20-2015   dr Kathyrn Sheriff   fusion L4-5, L5-S1  . MOUTH SURGERY  07/14/2018   2 infected teeth removed  . MULTIPLE TOOTH EXTRACTIONS     with bone grafting lower bottom right and left  . REDUCTION MAMMAPLASTY Bilateral   . RIGHT COLECTOMY  09-10-2003   dr Margot Chimes @[facility]    multiple colon polyps (per path tubular adenoma's, hyperplastic , benign appendix, benign two lymph nodes  . ROTATOR CUFF REPAIR Left 04/2016  . TONSILLECTOMY  1969  . TUBAL LIGATION Bilateral 1982     reports that she quit smoking about 9 months ago. Her smoking use included cigarettes. She has a 17.50 pack-year smoking history. She has never used smokeless tobacco. She reports current alcohol use. She reports that she does not use drugs.  Allergies  Allergen Reactions  . Ampicillin Hives and Other (See Comments)    Severe reaction in February 2017 1.5 month to have hives to go away  . Penicillins Other (See Comments)    Severe reaction in February 2017 1.5 month to have hives to go away Has patient had a PCN reaction causing immediate rash, facial/tongue/throat swelling, SOB or lightheadedness with hypotension: #  #  #  YES  #  #  #  Has patient had a PCN reaction causing severe rash involving mucus membranes or skin necrosis: No Has patient had a PCN reaction that required hospitalization No Has patient had a PCN reaction occurring within the last 10 years: No   . Gabapentin Swelling  . Morphine And Related Itching    Patient states she is not allergic to morphine  . Other Itching    UNSPECIFIED Analgesics  . Codeine Nausea And Vomiting  . Hydrocodone-Acetaminophen Itching  .  Hydromorphone Itching    Pt has taken w/o issues    Family History  Problem Relation Age of Onset  . Atrial fibrillation Mother   . Ulcers Father   . Breast cancer Maternal Aunt   . Lung cancer Maternal Uncle   . Stomach cancer Maternal Grandmother   . Lung cancer Maternal Aunt   . Brain cancer Maternal Aunt   . Prostate cancer Maternal Grandfather   . Stroke Paternal Grandmother   . Tuberculosis Paternal Grandfather   . Colon cancer Neg Hx   . Esophageal cancer Neg Hx   . Rectal cancer Neg Hx      Prior to Admission medications   Medication Sig Start Date End Date Taking? Authorizing Provider  albuterol (  PROVENTIL HFA;VENTOLIN HFA) 108 (90 Base) MCG/ACT inhaler INHALE 2 PUFFS BY MOUTH EVERY 4 TO 6 HOURS AS NEEDED 05/03/18   Henson, Vickie L, NP-C  allopurinol (ZYLOPRIM) 300 MG tablet Take 1 tablet (300 mg total) by mouth daily. 12/20/18   Henson, Vickie L, NP-C  allopurinol (ZYLOPRIM) 300 MG tablet Take 300 mg by mouth daily.    [provider]  anastrozole (ARIMIDEX) 1 MG tablet TAKE 1 TABLET(1 MG) BY MOUTH DAILY 01/21/19   Magrinat, Virgie Dad, MD  atorvastatin (LIPITOR) 20 MG tablet TAKE 1 TABLET BY MOUTH EVERY DAY 01/21/19   Henson, Vickie L, NP-C  azithromycin (ZITHROMAX) 250 MG tablet Take 1 tablet (250 mg total) by mouth daily. Take first 2 tablets together, then 1 every day until finished. 02/07/19   Loura Halt A, NP  baclofen (LIORESAL) 10 MG tablet Take 1 tablet (10 mg) once daily as needed. Patient taking differently: Take 10 mg by mouth as needed.  08/16/18   Henson, Vickie L, NP-C  benzonatate (TESSALON) 100 MG capsule Take 1 capsule (100 mg total) by mouth every 8 (eight) hours. 02/07/19   Loura Halt A, NP  Biotin (BIOTIN 5000) 5 MG CAPS Take 5 mg by mouth daily.     [provider]  busPIRone (BUSPAR) 15 MG tablet Take 15 mg by mouth 3 (three) times daily.  09/28/17   [provider]  Calcium Carbonate-Vitamin D (CALCIUM 500/D PO) Take 2 each by  mouth daily.     [provider]  colchicine 0.6 MG tablet Take 2 tablets now and 1 tablet an hour later for flare up 12/20/18   Henson, Vickie L, NP-C  diclofenac sodium (VOLTAREN) 1 % GEL APPLY 4 GRAMS EXTERNALLY TO THE AFFECTED AREA FOUR TIMES DAILY 01/15/19   Henson, Vickie L, NP-C  DULoxetine (CYMBALTA) 60 MG capsule Take 1 capsule (60 mg total) by mouth daily. 02/12/19   Marcial Pacas, MD  furosemide (LASIX) 20 MG tablet TAKE 1 TABLET BY MOUTH DAILY AS NEEDED 01/23/19   Henson, Vickie L, NP-C  hydrOXYzine (VISTARIL) 25 MG capsule Take 25 mg by mouth 2 (two) times daily as needed for anxiety.    [provider]  levothyroxine (SYNTHROID) 75 MCG tablet TAKE 1 TABLET(75 MCG) BY MOUTH DAILY BEFORE BREAKFAST 01/21/19   Henson, Vickie L, NP-C  LINZESS 145 MCG CAPS capsule TAKE 1 CAPSULE BY MOUTH EVERY DAY BEFORE BREAKFAST 03/08/18   Henson, Vickie L, NP-C  Magnesium 500 MG CAPS Take 500 mg by mouth daily.    [provider]  MEGARED OMEGA-3 KRILL OIL PO Take by mouth.    [provider]  Melatonin 3 MG CAPS Take 9 mg by mouth at bedtime.    [provider]  metoprolol succinate (TOPROL-XL) 25 MG 24 hr tablet TAKE 1/2 TABLET(12.5 MG) BY MOUTH DAILY Patient taking differently: Take 12.5 mg by mouth at bedtime.  06/25/18   Hilty, Nadean Corwin, MD  olmesartan-hydrochlorothiazide (BENICAR HCT) 40-12.5 MG tablet TAKE 1 TABLET BY MOUTH DAILY 01/21/19   Henson, Vickie L, NP-C  polyethylene glycol powder (MIRALAX) powder Take 17 g by mouth daily as needed for moderate constipation.     [provider]  primidone (MYSOLINE) 50 MG tablet TAKE 1 TABLET(50 MG) BY MOUTH AT BEDTIME 01/21/19   Henson, Vickie L, NP-C  promethazine (PHENERGAN) 12.5 MG tablet Take 1 tablet (12.5 mg total) by mouth every 8 (eight) hours as needed for nausea or vomiting. 08/30/18   Harland Dingwall  L, NP-C  rOPINIRole (REQUIP) 0.5 MG tablet Take 1.5 tablets (0.75 mg total) by mouth at bedtime.  Patient taking differently: Take 0.5 mg by mouth at bedtime.  04/20/18   Narda Amber K, DO  sulfamethoxazole-trimethoprim (BACTRIM DS) 800-160 MG tablet Take 1 tablet by mouth 2 (two) times daily. 02/19/19   Magrinat, Virgie Dad, MD  vitamin B-12 (CYANOCOBALAMIN) 1000 MCG tablet Take 1,000 mcg by mouth daily.    [provider]    Physical Exam: Vitals:   03/01/19 2013 03/01/19 2015 03/01/19 2100 03/02/19 0014  BP: 132/84  134/68 (!) 150/80  Pulse: 98 (!) 101 90 96  Resp:  20 (!) 24 18  Temp: 98.1 F (36.7 C)   98.3 F (36.8 C)  TempSrc: Oral   Oral  SpO2: 96% 96% 95% 97%    Constitutional: NAD, calm, comfortable Vitals:   03/01/19 2013 03/01/19 2015 03/01/19 2100 03/02/19 0014  BP: 132/84  134/68 (!) 150/80  Pulse: 98 (!) 101 90 96  Resp:  20 (!) 24 18  Temp: 98.1 F (36.7 C)   98.3 F (36.8 C)  TempSrc: Oral   Oral  SpO2: 96% 96% 95% 97%   Eyes: PERRL, lids and conjunctivae normal ENMT: Mucous membranes are moist. Posterior pharynx clear of any exudate or lesions.Normal dentition.  Neck: normal, supple, no masses, no thyromegaly Respiratory: clear to auscultation bilaterally, no wheezing, no crackles. Normal respiratory effort. No accessory muscle use.  Cardiovascular: Regular rate and rhythm, no murmurs / rubs / gallops. No extremity edema. 2+ pedal pulses. No carotid bruits.  Abdomen: no tenderness, no masses palpated. No hepatosplenomegaly. Bowel sounds positive.  Musculoskeletal: no clubbing / cyanosis. No joint deformity upper and lower extremities. Good ROM, no contractures. Normal muscle tone.  Skin: no rashes, lesions, ulcers. No induration Neurologic: CN 2-12 grossly intact. Sensation intact, DTR normal. Strength 5/5 in all 4.  Psychiatric: Normal judgment and insight. Alert and oriented x 3. Normal mood.    Labs on Admission: I have personally reviewed following labs and imaging studies  CBC: Recent Labs  Lab 03/01/19 1718 03/01/19 1900  WBC 9.0 9.7   NEUTROABS 6.4 7.0  HGB 11.9* 11.9*  HCT 37.9 37.0  MCV 103.0* 102.8*  PLT 322 [id number]   Basic Metabolic Panel: Recent Labs  Lab 03/01/19 1900  NA 136  K 5.5*  CL 106  CO2 19*  GLUCOSE 129*  BUN 36*  CREATININE 1.65*  CALCIUM 9.5   GFR: Estimated Creatinine Clearance: 40.8 mL/min (A) (by C-G formula based on SCr of 1.65 mg/dL (H)). Liver Function Tests: Recent Labs  Lab 03/01/19 1900  AST 21  ALT 24  ALKPHOS 87  BILITOT 0.8  PROT 7.0  ALBUMIN 4.1   No results for input(s): LIPASE, AMYLASE in the last 168 hours. Recent Labs  Lab 03/01/19 1900  AMMONIA 28   Coagulation Profile: Recent Labs  Lab 03/01/19 1854  INR 1.0   Cardiac Enzymes: No results for input(s): CKTOTAL, CKMB, CKMBINDEX, TROPONINI in the last 168 hours. BNP (last 3 results) No results for input(s): PROBNP in the last 8760 hours. HbA1C: No results for input(s): HGBA1C in the last 72 hours. CBG: No results for input(s): GLUCAP in the last 168 hours. Lipid Profile: No results for input(s): CHOL, HDL, LDLCALC, TRIG, CHOLHDL, LDLDIRECT in the last 72 hours. Thyroid Function Tests: Recent Labs    03/01/19 2034  TSH 2.346   Anemia Panel: No results for input(s): VITAMINB12, FOLATE, FERRITIN, TIBC, IRON, RETICCTPCT  in the last 72 hours. Urine analysis:    Component Value Date/Time   COLORURINE YELLOW 03/02/2019 0024   APPEARANCEUR CLEAR 03/02/2019 0024   LABSPEC 1.021 03/02/2019 0024   LABSPEC 1.025 12/13/2018 1153   PHURINE 5.0 03/02/2019 0024   GLUCOSEU NEGATIVE 03/02/2019 0024   HGBUR NEGATIVE 03/02/2019 0024   BILIRUBINUR NEGATIVE 03/02/2019 0024   BILIRUBINUR negative 12/13/2018 1153   BILIRUBINUR n 11/25/2015 1712   KETONESUR NEGATIVE 03/02/2019 0024   PROTEINUR NEGATIVE 03/02/2019 0024   UROBILINOGEN negative 11/25/2015 1712   NITRITE NEGATIVE 03/02/2019 0024   LEUKOCYTESUR NEGATIVE 03/02/2019 0024    Radiological Exams on Admission: Dg Chest 2 View  Result Date: 03/01/2019  CLINICAL DATA:  Possible sepsis. EXAM: CHEST - 2 VIEW COMPARISON:  02/19/2019 FINDINGS: The lungs are clear without focal pneumonia, edema, pneumothorax or pleural effusion. Lungs are hyperexpanded suggesting emphysema. The cardiopericardial silhouette is within normal limits for size. IMPRESSION: No active cardiopulmonary disease. Electronically Signed   By: Misty Stanley M.D.   On: 03/01/2019 17:31   Ct Head Wo Contrast  Result Date: 03/01/2019 CLINICAL DATA:  Altered level of consciousness. EXAM: CT HEAD WITHOUT CONTRAST TECHNIQUE: Contiguous axial images were obtained from the base of the skull through the vertex without intravenous contrast. COMPARISON:  05/17/2015 FINDINGS: Brain: Stable age related cerebral atrophy, ventriculomegaly and advanced periventricular white matter disease. No extra-axial fluid collections are identified. No CT findings for acute hemispheric infarction or intracranial hemorrhage. No mass lesions. The brainstem and cerebellum are normal. Vascular: No hyperdense vessels or aneurysm. Stable vascular calcifications. Skull: No skull fracture or bone lesions. Sinuses/Orbits: The paranasal sinuses and mastoid air cells are clear. The globes are intact. Other: No scalp lesions or hematoma. IMPRESSION: 1. No acute intracranial findings or mass lesions. 2. Stable appearing age advanced periventricular white matter disease. Electronically Signed   By: Marijo Sanes M.D.   On: 03/01/2019 19:39   Ct Chest Wo Contrast  Result Date: 03/01/2019 CLINICAL DATA:  Exertional shortness of breath EXAM: CT CHEST WITHOUT CONTRAST TECHNIQUE: Multidetector CT imaging of the chest was performed following the standard protocol without IV contrast. COMPARISON:  06/01/2018 FINDINGS: Cardiovascular: Scattered coronary artery and aortic atherosclerosis. Heart is normal size. Aorta is normal caliber. Mediastinum/Nodes: No mediastinal, hilar, or axillary adenopathy. Lungs/Pleura: Moderate centrilobular  emphysema. Areas of scarring in the lingula and right middle lobe. Tree-in-bud nodular densities noted in the lingula and right middle lobe. No effusions. Upper Abdomen: Imaging into the upper abdomen shows no acute findings. Musculoskeletal: Chest wall soft tissues are unremarkable. No acute bony abnormality. IMPRESSION: Scattered coronary artery and aortic calcifications. 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 Aortic Atherosclerosis (ICD10-I70.0). Electronically Signed   By: Rolm Baptise M.D.   On: 03/01/2019 23:06    EKG: Independently reviewed.  Normal sinus no acute ST-T changes  Assessment/Plan Active Problems:   Sepsis ([place])   Mixed hyperlipidemia   Generalized anxiety disorder   Essential hypertension   Gout   Prediabetes   CKD (chronic kidney disease), stage III   Hypothyroidism   Assessment and plan  Acute encephalopathy Probably secondary to sepsis  Patient patient was confused per family seem to have a slurring speech.  She has a slight tremor in the hands By the time of my examination she told me she feels much better she feels back to normal She denies any headache, stiff neck no blurred vision,  one episode of vomiting no meningeal signs on physical exam CT scan of the head negative MRI pending Plan await MRI results, start empiric oral antibiotics no indication for lumbar puncture Patient states a similar episode 3 years ago when she was diagnosed with pneumonia  Sepsis Patient has sepsis criteria tachycardia heart rate more than 90 Lactic acid 2.4 Not sure of the etiology possible pneumonia Plan blood culture, empirical antibiotic Azactam and vancomycin, patient is allergic to penicillin IV fluids per sepsis protocol serial lactic acid procalcitonin   Atypical pneumonia Signs of bronchitis for few weeks no improvement with 2 antibiotics Cipro and Bactrim  Plan urine for Legionella and strep CT scan suspicion of Mycobacterium avium intracellular Plan continue same type of antibiotic May need pulmonary consult  Essential hypertension Resume Toprol  Gout Allopurinol  Hypercholesterolemia Lipitor  Chronic kidney disease stage IIIb Follow BUN and creatinine avoid nephrotoxin  Status post breast cancer Anastrozole  Hypothyroidism Resume Synthroid  Anxiety Resume home medication  DVT prophylaxis: Lovenox Code Status: Full code Family Communication: Yes Disposition Plan: Discharge home Consults called: No Admission status: Full admission   Elio Haden G Halle Davlin MD [facility]  If 7PM-7AM, please contact night-coverage [email or web address]   03/02/2019, 1:06 AM

## 2019-03-02 NOTE — Progress Notes (Signed)
Patient has been admitted on to unit. Assessment and vitals have been completed, Skin was assessed. Oriented to the room and call bell. Bed alarm is on.    Oneita Kras, RN

## 2019-03-02 NOTE — ED Notes (Signed)
Patient transported to MRI 

## 2019-03-02 NOTE — ED Notes (Signed)
ED TO INPATIENT HANDOFF REPORT  ED Nurse Name and Phone #: Lovell Sheehan [phone]  S Name/Age/Gender Brandi Stephens 73 y.o. female Room/Bed: 022C/022C  Code Status   Code Status: Full Code  Home/SNF/Other Home Patient oriented to: self, place, time and situation Is this baseline? Yes   Triage Complete: Triage complete  Chief Complaint Sepsis  Triage Note Patient in ED to be evaluated for possible sepsis, hx of same. Patient states recent pneumonia and completed antibiotics. Patient states that she has confusion and had similar with previous infection. On assessment alert and oriented   Allergies Allergies  Allergen Reactions  . Ampicillin Hives and Other (See Comments)    Severe reaction in February 2017 1.5 month to have hives to go away  . Penicillins Other (See Comments)    Severe reaction in February 2017 1.5 month to have hives to go away Has patient had a PCN reaction causing immediate rash, facial/tongue/throat swelling, SOB or lightheadedness with hypotension: #  #  #  YES  #  #  #  Has patient had a PCN reaction causing severe rash involving mucus membranes or skin necrosis: No Has patient had a PCN reaction that required hospitalization No Has patient had a PCN reaction occurring within the last 10 years: No   . Gabapentin Swelling  . Morphine And Related Itching    Patient states she is not allergic to morphine  . Other Itching    UNSPECIFIED Analgesics  . Codeine Nausea And Vomiting  . Hydrocodone-Acetaminophen Itching  . Hydromorphone Itching    Pt has taken w/o issues    Level of Care/Admitting Diagnosis ED Disposition    ED Disposition Condition [facility] Area: [place] [[id number]]  Level of Care: Telemetry Medical [104]  Covid Evaluation: Asymptomatic Screening Protocol (No Symptoms)  [facility]: [id number]  Admitting Physician: Assunta Found [id number]  Attending Physician: Assunta Found  [id number]  Estimated length of stay: 3 - 4 days  Certification:: I certify this patient will need inpatient services for at least 2 midnights  PT Class (Do Not Modify): Inpatient [101]  PT Acc Code (Do Not Modify): Private [1]       B Medical/Surgery History Past Medical History:  Diagnosis Date  . AIN (anal intraepithelial neoplasia) anal canal   . Anemia    with pregnancy  . Anxiety   . Arthritis    knees, lower back, knees  . Chronic constipation   . Coarse tremors    Essential  . COPD (chronic obstructive pulmonary disease) ([place])    2017 chest CT, history of  . Degenerative lumbar spinal stenosis   . GERD (gastroesophageal reflux disease)    pt denies  . Gout   . Grade I diastolic dysfunction 0000000   Noted ECHO  . Hemorrhoids   . History of adenomatous polyp of colon   . History of basal cell carcinoma (BCC) excision 2015   nose  . History of sepsis 05/14/2014   post lumbar surgery  . History of shingles    x2  . Hyperlipidemia   . Hypertension   . Hypothyroidism   . Irregular heart beat    history of while undergoing radiation  . Malignant neoplasm of upper-inner quadrant of left breast in female, estrogen receptor positive [facility]) oncologist-  dr Jana Hakim--  per lov in epic no recurrence   dx 07-13-2015  Left breast invasive ductal carcinoma, Stage IA, Grade 1 (TXN0),  09-16-2015  s/p  left breast lumpectomy with sln bx's,  completed radiation 11-18-2015,  started antiestrogen therapy 12-14-2015  . Neuropathy    Left foot and back of left leg  . Personal history of radiation therapy    completed 11-18-2015  left breast  . Pneumonia   . PONV (postoperative nausea and vomiting)   . Prediabetes    diet controlled, no med  . Restless leg syndrome   . Seasonal allergies   . Seizure ([place]) 05/2015   due to sepsis, only one time episode  . Tremor   . Wears partial dentures    lower    Past Surgical History:  Procedure Laterality Date  . ABDOMINAL  HYSTERECTOMY  1985  . BASAL CELL CARCINOMA EXCISION  10/15   nose  . BREAST BIOPSY Right 08/24/2015  . BREAST BIOPSY Right 07/13/2015  . BREAST BIOPSY Left 07/13/2015  . BREAST EXCISIONAL BIOPSY Left 1972  . BREAST LUMPECTOMY Left   . BREAST LUMPECTOMY WITH RADIOACTIVE SEED AND SENTINEL LYMPH NODE BIOPSY Left 09/16/2015   Procedure: BREAST LUMPECTOMY WITH RADIOACTIVE SEED AND SENTINEL LYMPH NODE BIOPSY;  Surgeon: Stark Klein, MD;  Location: [facility];  Service: General;  Laterality: Left;  . BREAST REDUCTION SURGERY Bilateral 1998  . CATARACT EXTRACTION W/ INTRAOCULAR LENS IMPLANT Left 2016  . [place]   hallo  . COLONOSCOPY  01/2013   polyps  . EYE SURGERY Left 2016  . HAND SURGERY Left 02-19-2002   dr Fredna Dow  @[facility]    repair collateral ligament/ MPJ left thumb  . [place]  2008  . HIGH RESOLUTION ANOSCOPY N/A 02/28/2018   Procedure: HIGH RESOLUTION ANOSCOPY WITH BIOPSY;  Surgeon: Leighton Ruff, MD;  Location: [facility];  Service: General;  Laterality: N/A;  . KNEE ARTHROSCOPY Right 2002  . LUMBAR SPINE SURGERY  03-20-2015   dr Kathyrn Sheriff   fusion L4-5, L5-S1  . MOUTH SURGERY  07/14/2018   2 infected teeth removed  . MULTIPLE TOOTH EXTRACTIONS     with bone grafting lower bottom right and left  . REDUCTION MAMMAPLASTY Bilateral   . RIGHT COLECTOMY  09-10-2003   dr Margot Chimes @[facility]    multiple colon polyps (per path tubular adenoma's, hyperplastic , benign appendix, benign two lymph nodes  . ROTATOR CUFF REPAIR Left 04/2016  . TONSILLECTOMY  1969  . TUBAL LIGATION Bilateral 1982     A IV Location/Drains/Wounds Patient Lines/Drains/Airways Status   Active Line/Drains/Airways    Name:   Placement date:   Placement time:   Site:   Days:   Peripheral IV 03/01/19 Right Wrist   03/01/19    1928    Wrist   1   Midline Single Lumen 03/01/19 Midline Right Cephalic 8 cm   [id number]    [id number]    Cephalic   1   Incision (Closed)  09/16/15 Breast Left   09/16/15    1308     1263   Incision (Closed) 09/16/15 Axilla Left   09/16/15    1308     1263   Incision (Closed) 02/28/18 Rectum   02/28/18    0957     367   Incision (Closed) 06/29/18 Back   06/29/18    1714     246          Intake/Output Last 24 hours  Intake/Output Summary (Last 24 hours) at 03/02/2019 0123 Last data filed at 03/02/2019 0026 Gross per 24 hour  Intake 700 ml  Output -  Net 700 ml    Labs/Imaging Results for orders placed or performed during the hospital encounter of 03/01/19 (from the past 48 hour(s))  Lactic acid, plasma     Status: Abnormal   Collection Time: 03/01/19  5:18 PM  Result Value Ref Range   Lactic Acid, Venous 2.4 (HH) 0.5 - 1.9 mmol/L    Comment: CRITICAL RESULT CALLED TO, READ BACK BY AND VERIFIED WITH: E MEEKS,RN 1809 03/01/2019 WBOND Performed at [facility], [address] [place], [place] [address]   CBC with Differential/Platelet     Status: Abnormal   Collection Time: 03/01/19  5:18 PM  Result Value Ref Range   WBC 9.0 4.0 - 10.5 K/uL   RBC 3.68 (L) 3.87 - 5.11 MIL/uL   Hemoglobin 11.9 (L) 12.0 - 15.0 g/dL   HCT 37.9 36.0 - 46.0 %   MCV 103.0 (H) 80.0 - 100.0 fL   MCH 32.3 26.0 - 34.0 pg   MCHC 31.4 30.0 - 36.0 g/dL   RDW 13.5 11.5 - 15.5 %   Platelets 322 150 - 400 K/uL   nRBC 0.0 0.0 - 0.2 %   Neutrophils Relative % 71 %   Neutro Abs 6.4 1.7 - 7.7 K/uL   Lymphocytes Relative 19 %   Lymphs Abs 1.7 0.7 - 4.0 K/uL   Monocytes Relative 5 %   Monocytes Absolute 0.4 0.1 - 1.0 K/uL   Eosinophils Relative 3 %   Eosinophils Absolute 0.3 0.0 - 0.5 K/uL   Basophils Relative 1 %   Basophils Absolute 0.1 0.0 - 0.1 K/uL   Immature Granulocytes 1 %   Abs Immature Granulocytes 0.11 (H) 0.00 - 0.07 K/uL    Comment: Performed at [facility] [address] [place], [place] [address]  APTT     Status: None   Collection Time: 03/01/19  6:54 PM  Result Value Ref Range   aPTT 27 24 - 36 seconds     Comment: Performed at [facility] [address], [place], [place] [address]  Protime-INR     Status: None   Collection Time: 03/01/19  6:54 PM  Result Value Ref Range   Prothrombin Time 13.5 11.4 - 15.2 seconds   INR 1.0 0.8 - 1.2    Comment: (NOTE) INR goal varies based on device and disease states. Performed at [facility], [place] [address], [place], [place] [address]   SARS CORONAVIRUS 2 (TAT 6-24 HRS) Nasopharyngeal Nasopharyngeal Swab     Status: None   Collection Time: 03/01/19  6:55 PM   Specimen: Nasopharyngeal Swab  Result Value Ref Range   SARS Coronavirus 2 NEGATIVE NEGATIVE    Comment: (NOTE) SARS-CoV-2 target nucleic acids are NOT DETECTED. The SARS-CoV-2 RNA is generally detectable in upper and lower respiratory specimens during the acute phase of infection. Negative results do not preclude SARS-CoV-2 infection, do not rule out co-infections with other pathogens, and should not be used as the sole basis for treatment or other patient management decisions. Negative results must be combined with clinical observations, patient history, and epidemiological information. The expected result is Negative. Fact Sheet for Patients: SugarRoll.be Fact Sheet for Healthcare Providers: https://www.woods-mathews.com/ This test is not yet approved or cleared by the [place] FDA and  has been authorized for detection and/or diagnosis of SARS-CoV-2 by FDA under an Emergency Use Authorization (EUA). This EUA will remain  in effect (meaning this test can be used) for the  duration of the COVID-19 declaration under Section 56 4(b)(1) of the Act, 21 U.S.C. section 360bbb-3(b)(1), unless the authorization is terminated or revoked sooner. Performed at [facility], [place] [address], [place], [place] [address]   Comprehensive metabolic panel     Status: Abnormal   Collection Time: 03/01/19  7:00 PM  Result Value Ref Range    Sodium 136 135 - 145 mmol/L   Potassium 5.5 (H) 3.5 - 5.1 mmol/L   Chloride 106 98 - 111 mmol/L   CO2 19 (L) 22 - 32 mmol/L   Glucose, Bld 129 (H) 70 - 99 mg/dL   BUN 36 (H) 8 - 23 mg/dL   Creatinine, Ser 1.65 (H) 0.44 - 1.00 mg/dL   Calcium 9.5 8.9 - 10.3 mg/dL   Total Protein 7.0 6.5 - 8.1 g/dL   Albumin 4.1 3.5 - 5.0 g/dL   AST 21 15 - 41 U/L   ALT 24 0 - 44 U/L   Alkaline Phosphatase 87 38 - 126 U/L   Total Bilirubin 0.8 0.3 - 1.2 mg/dL   GFR calc non Af Amer 31 (L) >60 mL/min   GFR calc Af Amer 36 (L) >60 mL/min   Anion gap 11 5 - 15    Comment: Performed at [facility], [place] [address] [place], [place] [address]  CBC with Differential     Status: Abnormal   Collection Time: 03/01/19  7:00 PM  Result Value Ref Range   WBC 9.7 4.0 - 10.5 K/uL   RBC 3.60 (L) 3.87 - 5.11 MIL/uL   Hemoglobin 11.9 (L) 12.0 - 15.0 g/dL   HCT 37.0 36.0 - 46.0 %   MCV 102.8 (H) 80.0 - 100.0 fL   MCH 33.1 26.0 - 34.0 pg   MCHC 32.2 30.0 - 36.0 g/dL   RDW 13.5 11.5 - 15.5 %   Platelets 301 150 - 400 K/uL   nRBC 0.0 0.0 - 0.2 %   Neutrophils Relative % 72 %   Neutro Abs 7.0 1.7 - 7.7 K/uL   Lymphocytes Relative 18 %   Lymphs Abs 1.7 0.7 - 4.0 K/uL   Monocytes Relative 6 %   Monocytes Absolute 0.5 0.1 - 1.0 K/uL   Eosinophils Relative 2 %   Eosinophils Absolute 0.2 0.0 - 0.5 K/uL   Basophils Relative 1 %   Basophils Absolute 0.1 0.0 - 0.1 K/uL   Immature Granulocytes 1 %   Abs Immature Granulocytes 0.12 (H) 0.00 - 0.07 K/uL    Comment: Performed at [facility] [address] [place], [place] [address]  Ammonia     Status: None   Collection Time: 03/01/19  7:00 PM  Result Value Ref Range   Ammonia 28 9 - 35 umol/L    Comment: Performed at [facility], [place] [address], [place], [place] [address]  Lactic acid, plasma     Status: Abnormal   Collection Time: 03/01/19  7:09 PM  Result Value Ref Range   Lactic Acid, Venous 2.2 (HH) 0.5 - 1.9 mmol/L    Comment: CRITICAL  VALUE NOTED.  VALUE IS CONSISTENT WITH PREVIOUSLY REPORTED AND CALLED VALUE. Performed at [facility], [place] [address], [place], [place] [address]   TSH     Status: None   Collection Time: 03/01/19  8:34 PM  Result Value Ref Range   TSH 2.346 0.350 - 4.500 uIU/mL    Comment: Performed by a 3rd Generation assay with a functional  sensitivity of <=0.01 uIU/mL. Performed at [facility], [place] [address], [place], [place] [address]   Urinalysis, Routine w reflex microscopic     Status: None   Collection Time: 03/02/19 12:24 AM  Result Value Ref Range   Color, Urine YELLOW YELLOW   APPearance CLEAR CLEAR   Specific Gravity, Urine 1.021 1.005 - 1.030   pH 5.0 5.0 - 8.0   Glucose, UA NEGATIVE NEGATIVE mg/dL   Hgb urine dipstick NEGATIVE NEGATIVE   Bilirubin Urine NEGATIVE NEGATIVE   Ketones, ur NEGATIVE NEGATIVE mg/dL   Protein, ur NEGATIVE NEGATIVE mg/dL   Nitrite NEGATIVE NEGATIVE   Leukocytes,Ua NEGATIVE NEGATIVE    Comment: Performed at [facility] [address], [place], [place] [address]   Dg Chest 2 View  Result Date: 03/01/2019 CLINICAL DATA:  Possible sepsis. EXAM: CHEST - 2 VIEW COMPARISON:  02/19/2019 FINDINGS: The lungs are clear without focal pneumonia, edema, pneumothorax or pleural effusion. Lungs are hyperexpanded suggesting emphysema. The cardiopericardial silhouette is within normal limits for size. IMPRESSION: No active cardiopulmonary disease. Electronically Signed   By: Misty Stanley M.D.   On: 03/01/2019 17:31   Ct Head Wo Contrast  Result Date: 03/01/2019 CLINICAL DATA:  Altered level of consciousness. EXAM: CT HEAD WITHOUT CONTRAST TECHNIQUE: Contiguous axial images were obtained from the base of the skull through the vertex without intravenous contrast. COMPARISON:  05/17/2015 FINDINGS: Brain: Stable age related cerebral atrophy, ventriculomegaly and advanced periventricular white matter disease. No extra-axial fluid collections are identified.  No CT findings for acute hemispheric infarction or intracranial hemorrhage. No mass lesions. The brainstem and cerebellum are normal. Vascular: No hyperdense vessels or aneurysm. Stable vascular calcifications. Skull: No skull fracture or bone lesions. Sinuses/Orbits: The paranasal sinuses and mastoid air cells are clear. The globes are intact. Other: No scalp lesions or hematoma. IMPRESSION: 1. No acute intracranial findings or mass lesions. 2. Stable appearing age advanced periventricular white matter disease. Electronically Signed   By: Marijo Sanes M.D.   On: 03/01/2019 19:39   Ct Chest Wo Contrast  Result Date: 03/01/2019 CLINICAL DATA:  Exertional shortness of breath EXAM: CT CHEST WITHOUT CONTRAST TECHNIQUE: Multidetector CT imaging of the chest was performed following the standard protocol without IV contrast. COMPARISON:  06/01/2018 FINDINGS: Cardiovascular: Scattered coronary artery and aortic atherosclerosis. Heart is normal size. Aorta is normal caliber. Mediastinum/Nodes: No mediastinal, hilar, or axillary adenopathy. Lungs/Pleura: Moderate centrilobular emphysema. Areas of scarring in the lingula and right middle lobe. Tree-in-bud nodular densities noted in the lingula and right middle lobe. No effusions. Upper Abdomen: Imaging into the upper abdomen shows no acute findings. Musculoskeletal: Chest wall soft tissues are unremarkable. No acute bony abnormality. IMPRESSION: Scattered coronary artery and aortic calcifications. 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 Aortic Atherosclerosis (ICD10-I70.0). Electronically Signed   By: Rolm Baptise M.D.   On: 03/01/2019 23:06    Pending Labs Unresulted Labs (From admission, onward)    Start     Ordered   03/02/19 0500  Protime-INR  Tomorrow morning,   R     03/01/19 2349   03/02/19 0500  Cortisol-am, blood  Tomorrow morning,   R      03/01/19 2349   03/02/19 0500  Procalcitonin  Tomorrow morning,   R     03/01/19 2349   03/02/19 [id number]  Basic metabolic panel  Daily,   R  03/01/19 2349   03/02/19 0500  CBC WITH DIFFERENTIAL  Daily,   R     03/01/19 2349   03/02/19 0500  Magnesium  Tomorrow morning,   R     03/01/19 2349   03/02/19 0100  Lactic acid, plasma  Once,   STAT     03/02/19 0100   03/01/19 1854  Blood Culture (routine x 2)  BLOOD CULTURE X 2,   STAT     03/01/19 1854   03/01/19 1854  Urine culture  ONCE - STAT,   STAT     03/01/19 1854          Vitals/Pain Today's Vitals   03/01/19 2013 03/01/19 2015 03/01/19 2100 03/02/19 0014  BP: 132/84  134/68 (!) 150/80  Pulse: 98 (!) 101 90 96  Resp:  20 (!) 24 18  Temp: 98.1 F (36.7 C)   98.3 F (36.8 C)  TempSrc: Oral   Oral  SpO2: 96% 96% 95% 97%  PainSc:        Isolation Precautions No active isolations  Medications Medications  vancomycin (VANCOCIN) 1,750 mg in sodium chloride 0.9 % 500 mL IVPB (has no administration in time range)  aztreonam (AZACTAM) 2 g in sodium chloride 0.9 % 100 mL IVPB (has no administration in time range)  sodium chloride flush (NS) 0.9 % injection 10-40 mL (has no administration in time range)  allopurinol (ZYLOPRIM) tablet 300 mg (has no administration in time range)  anastrozole (ARIMIDEX) tablet 1 mg (has no administration in time range)  atorvastatin (LIPITOR) tablet 20 mg (has no administration in time range)  metoprolol succinate (TOPROL-XL) 24 hr tablet 12.5 mg (has no administration in time range)  hydrOXYzine (ATARAX/VISTARIL) tablet 25 mg (has no administration in time range)  busPIRone (BUSPAR) tablet 15 mg (has no administration in time range)  DULoxetine (CYMBALTA) DR capsule 60 mg (has no administration in time range)  levothyroxine (SYNTHROID) tablet 75 mcg (has no administration in time range)  polyethylene glycol (MIRALAX / GLYCOLAX) packet 17 g (has no administration in time range)  vitamin B-12  (CYANOCOBALAMIN) tablet 1,000 mcg (has no administration in time range)  baclofen (LIORESAL) tablet 10 mg (has no administration in time range)  primidone (MYSOLINE) tablet 50 mg (has no administration in time range)  rOPINIRole (REQUIP) tablet 0.5 mg (has no administration in time range)  Melatonin TABS 9 mg (has no administration in time range)  magnesium oxide (MAG-OX) tablet 400 mg (has no administration in time range)  albuterol (VENTOLIN HFA) 108 (90 Base) MCG/ACT inhaler 2 puff (has no administration in time range)  promethazine (PHENERGAN) tablet 12.5 mg (has no administration in time range)  diclofenac sodium (VOLTAREN) 1 % transdermal gel 4 g (has no administration in time range)  enoxaparin (LOVENOX) injection 40 mg (has no administration in time range)  0.9 %  sodium chloride infusion (has no administration in time range)  sodium chloride flush (NS) 0.9 % injection 3 mL (has no administration in time range)  sodium chloride flush (NS) 0.9 % injection 3 mL (has no administration in time range)  0.9 %  sodium chloride infusion (has no administration in time range)  sodium chloride flush (NS) 0.9 % injection 3 mL (3 mLs Intravenous Given 03/01/19 1846)  aztreonam (AZACTAM) 2 g in sodium chloride 0.9 % 100 mL IVPB (0 g Intravenous Stopped 03/01/19 2055)  metroNIDAZOLE (FLAGYL) IVPB 500 mg (0 mg Intravenous Stopped 03/01/19 2207)  vancomycin (VANCOCIN) 2,000 mg in sodium chloride 0.9 %  500 mL IVPB (0 mg Intravenous Stopped 03/02/19 0026)  sodium chloride 0.9 % bolus 1,000 mL (0 mLs Intravenous Paused 03/01/19 2348)  LORazepam (ATIVAN) injection 1 mg (1 mg Intravenous Given 03/02/19 0013)    Mobility walks Low fall risk   Focused Assessments Neuro Assessment Handoff:  Swallow screen pass? Yes    NIH Stroke Scale ( + Modified Stroke Scale Criteria)  Interval: Shift assessment Level of Consciousness (1a.)   : Alert, keenly responsive LOC Questions (1b. )   +: Answers one  question correctly LOC Commands (1c. )   + : Performs both tasks correctly Best Gaze (2. )  +: Normal Visual (3. )  +: No visual loss Facial Palsy (4. )    : Normal symmetrical movements Motor Arm, Left (5a. )   +: No drift Motor Arm, Right (5b. )   +: No drift Motor Leg, Left (6a. )   +: No drift Motor Leg, Right (6b. )   +: No drift Limb Ataxia (7. ): Absent Sensory (8. )   +: Normal, no sensory loss Best Language (9. )   +: No aphasia Dysarthria (10. ): Normal Extinction/Inattention (11.)   +: No Abnormality Modified SS Total  +: 1 Complete NIHSS TOTAL: 1     Neuro Assessment: Exceptions to WDL Neuro Checks:   Shift assessment (03/01/19 2100)  Last Documented NIHSS Modified Score: 1 (03/01/19 2100) Has TPA been given? No If patient is a Neuro Trauma and patient is going to OR before floor call report to [facility] nurse: [phone] or [phone]     R Recommendations: See Admitting Provider Note  Report given to:   Additional Notes:

## 2019-03-02 NOTE — Plan of Care (Signed)
  Problem: Education: Goal: Knowledge of General Education information will improve Description: Including pain rating scale, medication(s)/side effects and non-pharmacologic comfort measures 03/02/2019 0435 by Toni Amend, RN Outcome: Progressing 03/02/2019 0435 by Toni Amend, RN Outcome: Progressing   Problem: Health Behavior/Discharge Planning: Goal: Ability to manage health-related needs will improve 03/02/2019 0435 by Toni Amend, RN Outcome: Progressing 03/02/2019 0435 by Toni Amend, RN Outcome: Progressing   Problem: Clinical Measurements: Goal: Ability to maintain clinical measurements within normal limits will improve 03/02/2019 0435 by Toni Amend, RN Outcome: Progressing 03/02/2019 0435 by Toni Amend, RN Outcome: Progressing Goal: Will remain free from infection 03/02/2019 0435 by Toni Amend, RN Outcome: Progressing 03/02/2019 0435 by Toni Amend, RN Outcome: Progressing Goal: Diagnostic test results will improve 03/02/2019 0435 by Toni Amend, RN Outcome: Progressing 03/02/2019 0435 by Toni Amend, RN Outcome: Progressing Goal: Respiratory complications will improve 03/02/2019 0435 by Toni Amend, RN Outcome: Progressing 03/02/2019 0435 by Toni Amend, RN Outcome: Progressing Goal: Cardiovascular complication will be avoided 03/02/2019 0435 by Toni Amend, RN Outcome: Progressing 03/02/2019 0435 by Toni Amend, RN Outcome: Progressing   Problem: Activity: Goal: Risk for activity intolerance will decrease 03/02/2019 0435 by Toni Amend, RN Outcome: Progressing 03/02/2019 0435 by Toni Amend, RN Outcome: Progressing   Problem: Nutrition: Goal: Adequate nutrition will be maintained 03/02/2019 0435 by Toni Amend, RN Outcome: Progressing 03/02/2019 0435 by Toni Amend, RN Outcome: Progressing   Problem: Coping: Goal: Level of anxiety will  decrease 03/02/2019 0435 by Toni Amend, RN Outcome: Progressing 03/02/2019 0435 by Toni Amend, RN Outcome: Progressing   Problem: Elimination: Goal: Will not experience complications related to bowel motility 03/02/2019 0435 by Toni Amend, RN Outcome: Progressing 03/02/2019 0435 by Toni Amend, RN Outcome: Progressing Goal: Will not experience complications related to urinary retention 03/02/2019 0435 by Toni Amend, RN Outcome: Progressing 03/02/2019 0435 by Toni Amend, RN Outcome: Progressing   Problem: Pain Managment: Goal: General experience of comfort will improve 03/02/2019 0435 by Toni Amend, RN Outcome: Progressing 03/02/2019 0435 by Toni Amend, RN Outcome: Progressing   Problem: Safety: Goal: Ability to remain free from injury will improve 03/02/2019 0435 by Toni Amend, RN Outcome: Progressing 03/02/2019 0435 by Toni Amend, RN Outcome: Progressing   Problem: Skin Integrity: Goal: Risk for impaired skin integrity will decrease 03/02/2019 0435 by Toni Amend, RN Outcome: Progressing 03/02/2019 0435 by Toni Amend, RN Outcome: Progressing

## 2019-03-02 NOTE — Evaluation (Signed)
Physical Therapy Evaluation & Discharge Patient Details Name: Brandi Stephens MRN: [id number] DOB: 1945/12/15 Today's Date: 03/02/2019   History of Present Illness  Pt is a 73 y.o. female admitted 03/01/19 with AMS, which sharply improved in ED; pt tachypneic, tachycardic. CT/MRI unremarkable for acute abnormality. Chest CT showed possible atypical infection. Pt admitted for sepsis management. Of note, pt recently treated for PNA; (-) COVID. PMH includes anxiety, HTN, gout, COPD, breast CA, lumbar PLIF (06/2018).    Clinical Impression  Patient evaluated by Physical Therapy with no further acute PT needs identified. PTA, pt independent and lives with husband. Today, pt moving well with RW at supervision-level. Pt A&Ox4, although demonstrates poor attention and decreased safety awareness. Daughter present and reports pt not back to baseline cognition, although improving compared to even this morning. All education has been completed and the patient has no further questions. Encouraged continued mobility with supervision from family and/or nursing staff. Acute PT is signing off. Thank you for this referral.    Follow Up Recommendations No PT follow up;Supervision for mobility/OOB    Equipment Recommendations  None recommended by PT    Recommendations for Other Services       Precautions / Restrictions Precautions Precautions: Fall Restrictions Weight Bearing Restrictions: No      Mobility  Bed Mobility Overal bed mobility: Independent                Transfers Overall transfer level: Modified independent Equipment used: Rolling walker (2 wheeled);None                Ambulation/Gait Ambulation/Gait assistance: Supervision Gait Distance (Feet): 250 Feet Assistive device: Rolling walker (2 wheeled) Gait Pattern/deviations: Step-through pattern;Decreased stride length   Gait velocity interpretation: 1.31 - 2.62 ft/sec, indicative of limited community  ambulator General Gait Details: Steady gait with RW, supervision for safety; required cues for direction back to room, cues to slow down pace for safety  Stairs            Wheelchair Mobility    Modified Rankin (Stroke Patients Only)       Balance Overall balance assessment: Needs assistance   Sitting balance-Leahy Scale: Good Sitting balance - Comments: Indep to don socks sitting EOB     Standing balance-Leahy Scale: Fair Standing balance comment: Ambulatory without device, reaching to furniture/wall for UE support; dynamic stability improved with RW                             Pertinent Vitals/Pain Pain Assessment: No/denies pain    Home Living Family/patient expects to be discharged to:: Private residence Living Arrangements: Spouse/significant other Available Help at Discharge: Family;Available 24 hours/day Type of Home: House Home Access: Stairs to enter Entrance Stairs-Rails: None Entrance Stairs-Number of Steps: 2 Home Layout: Two level Home Equipment: Walker - 2 wheels;Cane - single point;Tub bench;Grab bars - tub/shower;Walker - 4 wheels;Adaptive equipment      Prior Function Level of Independence: Independent         Comments: Does not typically use DME; sometimes "furniture surfs"     Hand Dominance   Dominant Hand: Right    Extremity/Trunk Assessment   Upper Extremity Assessment Upper Extremity Assessment: Overall WFL for tasks assessed    Lower Extremity Assessment Lower Extremity Assessment: Overall WFL for tasks assessed       Communication   Communication: No difficulties  Cognition Arousal/Alertness: Awake/alert Behavior During Therapy: WFL for tasks assessed/performed Overall Cognitive  Status: Impaired/Different from baseline Area of Impairment: Attention;Safety/judgement;Awareness;Problem solving                   Current Attention Level: Selective     Safety/Judgement: Decreased awareness of  deficits;Decreased awareness of safety Awareness: Emergent Problem Solving: Requires verbal cues General Comments: Daughter present and reports pt not back to baseline cognition, although improving since this morning and yesterday      General Comments General comments (skin integrity, edema, etc.): Daughter present and supportive    Exercises     Assessment/Plan    PT Assessment Patent does not need any further PT services  PT Problem List         PT Treatment Interventions      PT Goals (Current goals can be found in the Care Plan section)  Acute Rehab PT Goals PT Goal Formulation: All assessment and education complete, DC therapy    Frequency     Barriers to discharge        Co-evaluation               AM-PAC PT "6 Clicks" Mobility  Outcome Measure Help needed turning from your back to your side while in a flat bed without using bedrails?: None Help needed moving from lying on your back to sitting on the side of a flat bed without using bedrails?: None Help needed moving to and from a bed to a chair (including a wheelchair)?: None Help needed standing up from a chair using your arms (e.g., wheelchair or bedside chair)?: None Help needed to walk in hospital room?: A Little Help needed climbing 3-5 steps with a railing? : A Little 6 Click Score: 22    End of Session   Activity Tolerance: Patient tolerated treatment well Patient left: in chair;with call bell/phone within reach;with family/visitor present Nurse Communication: Mobility status PT Visit Diagnosis: Other abnormalities of gait and mobility (R26.89)    Time: [id number] PT Time Calculation (min) (ACUTE ONLY): 20 min   Charges:   PT Evaluation $PT Eval Moderate Complexity: [place], PT, DPT Acute Rehabilitation Services  Pager [phone] Office [place] 03/02/2019, 4:31 PM

## 2019-03-03 DIAGNOSIS — F411 Generalized anxiety disorder: Secondary | ICD-10-CM

## 2019-03-03 DIAGNOSIS — E039 Hypothyroidism, unspecified: Secondary | ICD-10-CM

## 2019-03-03 DIAGNOSIS — N1832 Chronic kidney disease, stage 3b: Secondary | ICD-10-CM

## 2019-03-03 DIAGNOSIS — E782 Mixed hyperlipidemia: Secondary | ICD-10-CM

## 2019-03-03 DIAGNOSIS — I10 Essential (primary) hypertension: Secondary | ICD-10-CM

## 2019-03-03 LAB — CBC WITH DIFFERENTIAL/PLATELET
Abs Immature Granulocytes: 0.11 10*3/uL — ABNORMAL HIGH (ref 0.00–0.07)
Basophils Absolute: 0.1 10*3/uL (ref 0.0–0.1)
Basophils Relative: 1 %
Eosinophils Absolute: 0.3 10*3/uL (ref 0.0–0.5)
Eosinophils Relative: 4 %
HCT: 33.1 % — ABNORMAL LOW (ref 36.0–46.0)
Hemoglobin: 10.3 g/dL — ABNORMAL LOW (ref 12.0–15.0)
Immature Granulocytes: 2 %
Lymphocytes Relative: 31 %
Lymphs Abs: 2.1 10*3/uL (ref 0.7–4.0)
MCH: 32.3 pg (ref 26.0–34.0)
MCHC: 31.1 g/dL (ref 30.0–36.0)
MCV: 103.8 fL — ABNORMAL HIGH (ref 80.0–100.0)
Monocytes Absolute: 0.5 10*3/uL (ref 0.1–1.0)
Monocytes Relative: 7 %
Neutro Abs: 3.7 10*3/uL (ref 1.7–7.7)
Neutrophils Relative %: 55 %
Platelets: 278 10*3/uL (ref 150–400)
RBC: 3.19 MIL/uL — ABNORMAL LOW (ref 3.87–5.11)
RDW: 13.2 % (ref 11.5–15.5)
WBC: 6.7 10*3/uL (ref 4.0–10.5)
nRBC: 0 % (ref 0.0–0.2)

## 2019-03-03 LAB — URINE CULTURE

## 2019-03-03 LAB — BASIC METABOLIC PANEL
Anion gap: 8 (ref 5–15)
BUN: 24 mg/dL — ABNORMAL HIGH (ref 8–23)
CO2: 19 mmol/L — ABNORMAL LOW (ref 22–32)
Calcium: 9.4 mg/dL (ref 8.9–10.3)
Chloride: 113 mmol/L — ABNORMAL HIGH (ref 98–111)
Creatinine, Ser: 1.18 mg/dL — ABNORMAL HIGH (ref 0.44–1.00)
GFR calc Af Amer: 53 mL/min — ABNORMAL LOW (ref >60)
GFR calc non Af Amer: 46 mL/min — ABNORMAL LOW (ref >60)
Glucose, Bld: 127 mg/dL — ABNORMAL HIGH (ref 70–99)
Potassium: 5.1 mmol/L (ref 3.5–5.1)
Sodium: 140 mmol/L (ref 135–145)

## 2019-03-03 LAB — STREP PNEUMONIAE URINARY ANTIGEN: Strep Pneumo Urinary Antigen: NEGATIVE

## 2019-03-03 MED ORDER — IRBESARTAN 300 MG PO TABS
300.0000 mg | ORAL_TABLET | Freq: Every day | ORAL | Status: DC
  Administered 2019-03-03 – 2019-03-04 (×2): 300 mg via ORAL
  Filled 2019-03-03 (×2): qty 1

## 2019-03-03 MED ORDER — OLMESARTAN MEDOXOMIL-HCTZ 40-12.5 MG PO TABS: 1.0000 | ORAL_TABLET | Freq: Every day | ORAL | Status: DC

## 2019-03-03 MED ORDER — BACLOFEN 10 MG PO TABS
10.0000 mg | ORAL_TABLET | Freq: Three times a day (TID) | ORAL | Status: DC | PRN
  Administered 2019-03-03 – 2019-03-04 (×3): 10 mg via ORAL
  Filled 2019-03-03 (×3): qty 1

## 2019-03-03 MED ORDER — HYDROCHLOROTHIAZIDE 12.5 MG PO CAPS
12.5000 mg | ORAL_CAPSULE | Freq: Every day | ORAL | Status: DC
  Administered 2019-03-03 – 2019-03-04 (×2): 12.5 mg via ORAL
  Filled 2019-03-03 (×2): qty 1

## 2019-03-03 NOTE — Progress Notes (Signed)
PROGRESS NOTE  Brandi Stephens [id number] DOB: [birth year] DOA: 03/01/2019 PCP: Girtha Rm, NP-C  HPI/Recap of past 24 hours: HPI from Dr Birdie Riddle is a 73 y.o. female with medical history significant of status post breast cancer, anxiety, HTN, HLD, COPD, GERD, gout, presents with AMS, confusion which sharply improved in the ED.  Of note, patient has been treated twice in the outpatient setting for pneumonia, and had been battling pneumonialike symptoms for the past couple of months.  Patient was treated for several weeks with Cipro, then had a repeat chest x-ray on 02/19/2019 which showed bilateral pneumonia and was treated with Bactrim.  Patient also has tested negative for Covid twice.  In the ED, mentation improved, patient was noted to be tachypneic, tachycardic, CT head/MRI unremarkable, labs showed mild lactic acidosis, CT chest showed possible atypical infection like Mycobacterium avium.  Patient admitted for sepsis management.    Today, patient reports feeling better, denies any new complaints.  Denies chest pain, shortness of breath, abdominal pain, fever/chills, nausea/vomiting/diarrhea.     Assessment/Plan: Active Problems:   Sepsis ([place])   Mixed hyperlipidemia   Generalized anxiety disorder   Essential hypertension   Gout   Prediabetes   CKD (chronic kidney disease), stage III   Hypothyroidism  Unlikely sepsis Tachycardic, tachypneic, with lactic acidosis on presentation Currently afebrile, with no leukocytosis Lactic acidosis currently resolved Procalcitonin negative COVID-19 negative UA unremarkable, UC showed multiple species, repeat pending BC x2 NGTD Urine for Legionella/strep pending Chest x-ray with hyperexpanded lungs suggesting emphysema, with no focal pneumonia CT chest without contrast showed areas of scarring and tree-in-bud nodularity in the right middle lobe and lingula, which favor atypical indolent infection such as  Mycobacterium avium Spoke to ID Dr.Comer on 03/02/2019, who recommended getting AFB sputum collections if possible, as patient is currently not coughing, recommend outpatient follow-up with ID.  ID will refer patient if needed, to pulmonology for possible bronchoscopy as an outpatient Discontinue aztreonam, vancomycin for now, as no source of infection identified Monitor closely off of antibiotics  Acute metabolic encephalopathy Improving Likely 2/2 polypharmacy (recently increased Cymbalta, on baclofen, BuSpar, primidone, ropinirole), also daughter reported patient is also on CBD (recently changed the kind of CBD).  Has a shot of vodka daily CT head negative MRI brain unremarkable Management as above  Hypertension Continue Toprol  CKD stage IIIb Baseline creatinine 1.3-1.6 Creatinine at baseline Daily BMP  Anemia of chronic kidney disease Hemoglobin around baseline Daily CBC  Hyperlipidemia Continue Lipitor  Hypothyroidism Continue Synthroid  Gout Continue allopurinol  Status post breast cancer Continue anastrozole  Restless leg syndrome Continue primidone, ropinirole  Anxiety/depression Continue BuSpar, Cymbalta, hydroxyzine  ??  Daily alcohol use ??  Alcohol abuse, has a shot of vodka daily Monitor closely, may need CIWA protocol        Malnutrition Type:      Malnutrition Characteristics:      Nutrition Interventions:       Estimated body mass index is 39.46 kg/m as calculated from the following:   Height as of 02/12/19: 5' 7.5" (1.715 m).   Weight as of this encounter: 116 kg.     Code Status: Full  Family Communication: Discussed extensively with daughter on 03/03/2019  Disposition Plan: To be determined   Consultants:  Spoke to ID, Dr. Talbot Grumbling on 03/02/2019  Procedures:  None  Antimicrobials:  None  DVT prophylaxis: Lovenox   Objective: Vitals:   03/03/19 0527 03/03/19 0530 03/03/19 1726 03/03/19  1727  BP:  (!)  179/75 (!) 169/78 (!) 169/78  Pulse:  63 68 68  Resp:   20 18  Temp:  98.2 F (36.8 C) 98.4 F (36.9 C) 98.4 F (36.9 C)  TempSrc:  Oral Oral Oral  SpO2:  97% 97% 97%  Weight: 116 kg       Intake/Output Summary (Last 24 hours) at 03/03/2019 1730 Last data filed at 03/03/2019 1500 Gross per 24 hour  Intake 963 ml  Output 4 ml  Net 959 ml   Filed Weights   03/03/19 0527  Weight: 116 kg    Exam:  General: NAD, pleasant, oriented  Cardiovascular: S1, S2 present  Respiratory:  Diminished breath sounds bilaterally  Abdomen: Soft, nontender, nondistended, bowel sounds present  Musculoskeletal: No bilateral pedal edema noted  Skin: Normal  Psychiatry: Normal mood   Data Reviewed: CBC: Recent Labs  Lab 03/01/19 1718 03/01/19 1900 03/02/19 0550 03/03/19 0335  WBC 9.0 9.7 8.9 6.7  NEUTROABS 6.4 7.0 5.3 3.7  HGB 11.9* 11.9* 10.6* 10.3*  HCT 37.9 37.0 33.7* 33.1*  MCV 103.0* 102.8* 103.1* 103.8*  PLT 322 301 287 0000000   Basic Metabolic Panel: Recent Labs  Lab 03/01/19 1900 03/02/19 0550 03/03/19 0335  NA 136 140 140  K 5.5* 5.1 5.1  CL 106 110 113*  CO2 19* 19* 19*  GLUCOSE 129* 111* 127*  BUN 36* 30* 24*  CREATININE 1.65* 1.39* 1.18*  CALCIUM 9.5 9.4 9.4  MG  --  1.8  --    GFR: Estimated Creatinine Clearance: 57.2 mL/min (A) (by C-G formula based on SCr of 1.18 mg/dL (H)). Liver Function Tests: Recent Labs  Lab 03/01/19 1900  AST 21  ALT 24  ALKPHOS 87  BILITOT 0.8  PROT 7.0  ALBUMIN 4.1   No results for input(s): LIPASE, AMYLASE in the last 168 hours. Recent Labs  Lab 03/01/19 1900  AMMONIA 28   Coagulation Profile: Recent Labs  Lab 03/01/19 1854 03/02/19 0550  INR 1.0 1.0   Cardiac Enzymes: No results for input(s): CKTOTAL, CKMB, CKMBINDEX, TROPONINI in the last 168 hours. BNP (last 3 results) No results for input(s): PROBNP in the last 8760 hours. HbA1C: No results for input(s): HGBA1C in the last 72 hours. CBG: No results  for input(s): GLUCAP in the last 168 hours. Lipid Profile: No results for input(s): CHOL, HDL, LDLCALC, TRIG, CHOLHDL, LDLDIRECT in the last 72 hours. Thyroid Function Tests: Recent Labs    03/01/19 2034  TSH 2.346   Anemia Panel: No results for input(s): VITAMINB12, FOLATE, FERRITIN, TIBC, IRON, RETICCTPCT in the last 72 hours. Urine analysis:    Component Value Date/Time   COLORURINE YELLOW 03/02/2019 0024   APPEARANCEUR CLEAR 03/02/2019 0024   LABSPEC 1.021 03/02/2019 0024   LABSPEC 1.025 12/13/2018 1153   PHURINE 5.0 03/02/2019 0024   GLUCOSEU NEGATIVE 03/02/2019 0024   HGBUR NEGATIVE 03/02/2019 0024   BILIRUBINUR NEGATIVE 03/02/2019 0024   BILIRUBINUR negative 12/13/2018 1153   BILIRUBINUR n 11/25/2015 1712   KETONESUR NEGATIVE 03/02/2019 0024   PROTEINUR NEGATIVE 03/02/2019 0024   UROBILINOGEN negative 11/25/2015 1712   NITRITE NEGATIVE 03/02/2019 0024   LEUKOCYTESUR NEGATIVE 03/02/2019 0024   Sepsis Labs: @LABRCNTIP (procalcitonin:4,lacticidven:4)  ) Recent Results (from the past 240 hour(s))  Blood Culture (routine x 2)     Status: None (Preliminary result)   Collection Time: 03/01/19  5:20 PM   Specimen: BLOOD  Result Value Ref Range Status   Specimen Description BLOOD RIGHT ANTECUBITAL  Final   Special Requests AEROBIC BOTTLE ONLY Blood Culture adequate volume  Final   Culture   Final    NO GROWTH 2 DAYS Performed at [facility], [place] [address] [place], [place] [address]    Report Status PENDING  Incomplete  Blood Culture (routine x 2)     Status: None (Preliminary result)   Collection Time: 03/01/19  6:54 PM   Specimen: BLOOD  Result Value Ref Range Status   Specimen Description BLOOD RIGHT ANTECUBITAL  Final   Special Requests   Final    BOTTLES DRAWN AEROBIC AND ANAEROBIC Blood Culture adequate volume   Culture   Final    NO GROWTH 2 DAYS Performed at [facility], [place] [address] [place], [place] [address]    Report Status PENDING   Incomplete  SARS CORONAVIRUS 2 (TAT 6-24 HRS) Nasopharyngeal Nasopharyngeal Swab     Status: None   Collection Time: 03/01/19  6:55 PM   Specimen: Nasopharyngeal Swab  Result Value Ref Range Status   SARS Coronavirus 2 NEGATIVE NEGATIVE Final    Comment: (NOTE) SARS-CoV-2 target nucleic acids are NOT DETECTED. The SARS-CoV-2 RNA is generally detectable in upper and lower respiratory specimens during the acute phase of infection. Negative results do not preclude SARS-CoV-2 infection, do not rule out co-infections with other pathogens, and should not be used as the sole basis for treatment or other patient management decisions. Negative results must be combined with clinical observations, patient history, and epidemiological information. The expected result is Negative. Fact Sheet for Patients: SugarRoll.be Fact Sheet for Healthcare Providers: https://www.woods-mathews.com/ This test is not yet approved or cleared by the [place] FDA and  has been authorized for detection and/or diagnosis of SARS-CoV-2 by FDA under an Emergency Use Authorization (EUA). This EUA will remain  in effect (meaning this test can be used) for the duration of the COVID-19 declaration under Section 56 4(b)(1) of the Act, 21 U.S.C. section 360bbb-3(b)(1), unless the authorization is terminated or revoked sooner. Performed at [facility], [place] [address], [place], [place] [address]   Urine culture     Status: Abnormal   Collection Time: 03/01/19 11:56 PM   Specimen: In/Out Cath Urine  Result Value Ref Range Status   Specimen Description IN/OUT CATH URINE  Final   Special Requests   Final    NONE Performed at [facility], [place] [address], [place], [place] [address]    Culture MULTIPLE SPECIES PRESENT, SUGGEST RECOLLECTION (A)  Final   Report Status 03/03/2019 FINAL  Final  MRSA PCR Screening     Status: None   Collection Time: 03/02/19 12:43 PM    Specimen: Nasal Mucosa; Nasopharyngeal  Result Value Ref Range Status   MRSA by PCR NEGATIVE NEGATIVE Final    Comment:        The GeneXpert MRSA Assay (FDA approved for NASAL specimens only), is one component of a comprehensive MRSA colonization surveillance program. It is not intended to diagnose MRSA infection nor to guide or monitor treatment for MRSA infections. Performed at [facility], [place] [address] [place], [place] [address]       Studies: No results found.  Scheduled Meds: . allopurinol  300 mg Oral Daily  . anastrozole  1 mg Oral Daily  . atorvastatin  20 mg Oral Daily  . busPIRone  15 mg Oral TID  . diclofenac sodium  4 g Topical QID  . DULoxetine  60 mg  Oral Daily  . enoxaparin (LOVENOX) injection  40 mg Subcutaneous Q24H  . irbesartan  300 mg Oral Daily   And  . hydrochlorothiazide  12.5 mg Oral Daily  . levothyroxine  75 mcg Oral Q0600  . magnesium oxide  400 mg Oral Daily  . Melatonin  9 mg Oral QHS  . metoprolol succinate  12.5 mg Oral QHS  . primidone  50 mg Oral QHS  . rOPINIRole  0.5 mg Oral QHS  . sodium chloride flush  3 mL Intravenous Q12H  . vitamin B-12  1,000 mcg Oral Daily    Continuous Infusions: . sodium chloride       LOS: 2 days     Alma Friendly, MD [facility]  If 7PM-7AM, please contact night-coverage [email or web address] 03/03/2019, 5:30 PM

## 2019-03-04 DIAGNOSIS — G9341 Metabolic encephalopathy: Secondary | ICD-10-CM

## 2019-03-04 DIAGNOSIS — M1000 Idiopathic gout, unspecified site: Secondary | ICD-10-CM

## 2019-03-04 LAB — BASIC METABOLIC PANEL
Anion gap: 12 (ref 5–15)
BUN: 23 mg/dL (ref 8–23)
CO2: 19 mmol/L — ABNORMAL LOW (ref 22–32)
Calcium: 9.4 mg/dL (ref 8.9–10.3)
Chloride: 110 mmol/L (ref 98–111)
Creatinine, Ser: 1.28 mg/dL — ABNORMAL HIGH (ref 0.44–1.00)
GFR calc Af Amer: 48 mL/min — ABNORMAL LOW (ref >60)
GFR calc non Af Amer: 42 mL/min — ABNORMAL LOW (ref >60)
Glucose, Bld: 139 mg/dL — ABNORMAL HIGH (ref 70–99)
Potassium: 4.8 mmol/L (ref 3.5–5.1)
Sodium: 141 mmol/L (ref 135–145)

## 2019-03-04 LAB — CBC WITH DIFFERENTIAL/PLATELET
Abs Immature Granulocytes: 0.13 10*3/uL — ABNORMAL HIGH (ref 0.00–0.07)
Basophils Absolute: 0.1 10*3/uL (ref 0.0–0.1)
Basophils Relative: 1 %
Eosinophils Absolute: 0.3 10*3/uL (ref 0.0–0.5)
Eosinophils Relative: 4 %
HCT: 31.8 % — ABNORMAL LOW (ref 36.0–46.0)
Hemoglobin: 10.2 g/dL — ABNORMAL LOW (ref 12.0–15.0)
Immature Granulocytes: 2 %
Lymphocytes Relative: 35 %
Lymphs Abs: 2.5 10*3/uL (ref 0.7–4.0)
MCH: 32.4 pg (ref 26.0–34.0)
MCHC: 32.1 g/dL (ref 30.0–36.0)
MCV: 101 fL — ABNORMAL HIGH (ref 80.0–100.0)
Monocytes Absolute: 0.6 10*3/uL (ref 0.1–1.0)
Monocytes Relative: 8 %
Neutro Abs: 3.5 10*3/uL (ref 1.7–7.7)
Neutrophils Relative %: 50 %
Platelets: 271 10*3/uL (ref 150–400)
RBC: 3.15 MIL/uL — ABNORMAL LOW (ref 3.87–5.11)
RDW: 13.4 % (ref 11.5–15.5)
WBC: 7 10*3/uL (ref 4.0–10.5)
nRBC: 0 % (ref 0.0–0.2)

## 2019-03-04 LAB — URINE CULTURE: Culture: NO GROWTH

## 2019-03-04 NOTE — Discharge Summary (Signed)
Discharge Summary  Brandi Stephens [id number] DOB: Mar 09, 1946  PCP: Girtha Rm, NP-C  Admit date: 03/01/2019 Discharge date: 03/04/2019  Time spent: 40 mins  Recommendations for Outpatient Follow-up:  1. PCP in 1 week 2. Call to make an appointment with Infectious disease to follow up on your CAT scan  Discharge Diagnoses:  Active Hospital Problems   Diagnosis Date Noted   Hypothyroidism 11/30/2017   CKD (chronic kidney disease), stage III 07/08/2015   Prediabetes 07/08/2015   Mixed hyperlipidemia 06/08/2015   Generalized anxiety disorder 06/08/2015   Essential hypertension 06/08/2015   Gout 06/08/2015   Sepsis ([place]) 05/15/2015    Resolved Hospital Problems  No resolved problems to display.    Discharge Condition: Stable  Diet recommendation: Heart healthy  Vitals:   03/03/19 2113 03/04/19 0546  BP: (!) 149/88 (!) 152/79  Pulse: 71 67  Resp: 16 16  Temp: 97.7 F (36.5 C) (!) 97.5 F (36.4 C)  SpO2: 97% 97%    History of present illness:  Brandi Stephens a 73 y.o.femalewith medical history significant ofstatus post breast cancer, anxiety, HTN, HLD, COPD, GERD, gout, presents with AMS, confusion which sharply improved in the ED.  Of note, patient has been treated twice in the outpatient setting for pneumonia, and had been battling pneumonialike symptoms for the past couple of months.  Patient was treated for several weeks with Cipro, then had a repeat chest x-ray on 02/19/2019 which showed bilateral pneumonia and was treated with Bactrim.  Patient also has tested negative for Covid twice.  In the ED, mentation improved, patient was noted to be tachypneic, tachycardic, CT head/MRI unremarkable, labs showed mild lactic acidosis, CT chest showed possible atypical infection like Mycobacterium avium.  Patient admitted for sepsis management.    Today, pt denies any new complaints.  Patient reports feeling better, denies any confusion, fever/chills,  worsening cough, chest pain, abdominal pain, nausea/vomiting/diarrhea.   Hospital Course:  Active Problems:   Sepsis ([place])   Mixed hyperlipidemia   Generalized anxiety disorder   Essential hypertension   Gout   Prediabetes   CKD (chronic kidney disease), stage III   Hypothyroidism  Unlikely sepsis Tachycardic, tachypneic, with lactic acidosis on presentation Currently afebrile, with no leukocytosis Lactic acidosis currently resolved Procalcitonin negative COVID-19 negative UA unremarkable, UC showed multiple species, repeat pending BC x2 NGTD Urine for Legionella/strep pending Chest x-ray with hyperexpanded lungs suggesting emphysema, with no focal pneumonia CT chest without contrast showed areas of scarring and tree-in-bud nodularity in the right middle lobe and lingula, which favor atypical indolent infection such as Mycobacterium avium Spoke to ID Dr.Comer on 03/02/2019, who recommended getting AFB sputum collections if possible, as patient is currently not coughing, recommend outpatient follow-up with ID.  ID will refer patient if needed, to pulmonology for possible bronchoscopy as an outpatient Remained afebrile, with no leukocytosis/symptoms off antibiotics Follow-up with PCP in 1 week Follow-up with ID  Acute metabolic encephalopathy Resolved Likely 2/2 polypharmacy (recently increased Cymbalta, on baclofen, BuSpar, primidone, ropinirole), also daughter reported patient is also on CBD (recently changed the kind of CBD).  Has a shot of vodka daily Advised patient stop CBD and consider other forms of pain management.  Patient is currently avoiding any form of narcotics, which I agree Patient currently follows with our orthopedic surgeon (has had back multiple surgeries), who helps manages her pain CT head negative MRI brain unremarkable Management as above  Hypertension Continue Toprol  CKD stage IIIb Baseline creatinine 1.3-1.6 Creatinine at  baseline  Anemia  of chronic kidney disease Hemoglobin around baseline  Hyperlipidemia Continue Lipitor  Hypothyroidism Continue Synthroid  Gout Continue allopurinol  Status post breast cancer Continue anastrozole  Restless leg syndrome/tremors Continue primidone, ropinirole  Anxiety/depression Continue BuSpar, Cymbalta, hydroxyzine  ?? Daily alcohol use ??  Alcohol abuse, has a shot of vodka daily Advised to quit  Obesity Lifestyle modification advised       Malnutrition Type:      Malnutrition Characteristics:      Nutrition Interventions:      Estimated body mass index is 39.05 kg/m as calculated from the following:   Height as of 02/12/19: 5' 7.5" (1.715 m).   Weight as of this encounter: 114.8 kg.    Procedures:  None  Consultations:  Spoke to ID Dr. Talbot Grumbling  Discharge Exam: BP (!) 152/79 (BP Location: Right Leg)    Pulse 67    Temp (!) 97.5 F (36.4 C) (Oral)    Resp 16    Wt 114.8 kg    SpO2 97%    BMI 39.05 kg/m   General: NAD, oriented x3 Cardiovascular: S1, S2 present Respiratory: CTAB  Discharge Instructions You were cared for by a hospitalist during your hospital stay. If you have any questions about your discharge medications or the care you received while you were in the hospital after you are discharged, you can call the unit and asked to speak with the hospitalist on call if the hospitalist that took care of you is not available. Once you are discharged, your primary care physician will handle any further medical issues. Please note that NO REFILLS for any discharge medications will be authorized once you are discharged, as it is imperative that you return to your primary care physician (or establish a relationship with a primary care physician if you do not have one) for your aftercare needs so that they can reassess your need for medications and monitor your lab values.  Discharge Instructions    Diet - low sodium heart healthy    Complete by: As directed    Increase activity slowly   Complete by: As directed      Allergies as of 03/04/2019      Reactions   Ampicillin Hives, Other (See Comments)   Severe reaction in February 2017 1.5 month to have hives to go away   Penicillins Other (See Comments)   Severe reaction in February 2017 1.5 month to have hives to go away Has patient had a PCN reaction causing immediate rash, facial/tongue/throat swelling, SOB or lightheadedness with hypotension: #  #  #  YES  #  #  #  Has patient had a PCN reaction causing severe rash involving mucus membranes or skin necrosis: No Has patient had a PCN reaction that required hospitalization No Has patient had a PCN reaction occurring within the last 10 years: No   Gabapentin Swelling   Morphine And Related Itching   Patient states she is not allergic to morphine   Other Itching   UNSPECIFIED Analgesics   Codeine Nausea And Vomiting   Hydrocodone-acetaminophen Itching   Hydromorphone Itching   Pt has taken w/o issues      Medication List    TAKE these medications   albuterol 108 (90 Base) MCG/ACT inhaler Commonly known as: VENTOLIN HFA INHALE 2 PUFFS BY MOUTH EVERY 4 TO 6 HOURS AS NEEDED What changed:   how much to take  how to take this  when to take this  reasons to take this  additional instructions   allopurinol 300 MG tablet Commonly known as: ZYLOPRIM Take 1 tablet (300 mg total) by mouth daily.   anastrozole 1 MG tablet Commonly known as: ARIMIDEX TAKE 1 TABLET(1 MG) BY MOUTH DAILY What changed: See the new instructions.   atorvastatin 20 MG tablet Commonly known as: LIPITOR TAKE 1 TABLET BY MOUTH EVERY DAY What changed: when to take this   baclofen 10 MG tablet Commonly known as: LIORESAL Take 1 tablet (10 mg) once daily as needed. What changed:   how much to take  how to take this  when to take this  reasons to take this  additional instructions   Biotin 5000 5 MG Caps Generic  drug: Biotin Take 5 mg by mouth daily.   busPIRone 15 MG tablet Commonly known as: BUSPAR Take 15-30 mg by mouth See admin instructions. Take one tablet in the morning and two tablets in the evening   CALCIUM 500/D PO Take 2 each by mouth daily.   colchicine 0.6 MG tablet Take 2 tablets now and 1 tablet an hour later for flare up What changed:   how much to take  how to take this  when to take this  additional instructions   diclofenac sodium 1 % Gel Commonly known as: VOLTAREN APPLY 4 GRAMS EXTERNALLY TO THE AFFECTED AREA FOUR TIMES DAILY What changed: See the new instructions.   DULoxetine 60 MG capsule Commonly known as: Cymbalta Take 1 capsule (60 mg total) by mouth daily.   furosemide 20 MG tablet Commonly known as: LASIX TAKE 1 TABLET BY MOUTH DAILY AS NEEDED What changed: reasons to take this   hydrOXYzine 25 MG capsule Commonly known as: VISTARIL Take 25 mg by mouth 2 (two) times daily as needed for anxiety.   levothyroxine 75 MCG tablet Commonly known as: SYNTHROID TAKE 1 TABLET(75 MCG) BY MOUTH DAILY BEFORE BREAKFAST What changed: See the new instructions.   Magnesium 500 MG Caps Take 500 mg by mouth daily.   MEGARED OMEGA-3 KRILL OIL PO Take by mouth.   Melatonin 3 MG Caps Take 9 mg by mouth at bedtime.   metoprolol succinate 25 MG 24 hr tablet Commonly known as: TOPROL-XL TAKE 1/2 TABLET(12.5 MG) BY MOUTH DAILY What changed: See the new instructions.   MiraLax 17 GM/SCOOP powder Generic drug: polyethylene glycol powder Take 17 g by mouth daily as needed for moderate constipation.   olmesartan-hydrochlorothiazide 40-12.5 MG tablet Commonly known as: BENICAR HCT TAKE 1 TABLET BY MOUTH DAILY   primidone 50 MG tablet Commonly known as: MYSOLINE TAKE 1 TABLET(50 MG) BY MOUTH AT BEDTIME What changed: See the new instructions.   rOPINIRole 0.5 MG tablet Commonly known as: REQUIP Take 1.5 tablets (0.75 mg total) by mouth at bedtime.     vitamin B-12 1000 MCG tablet Commonly known as: CYANOCOBALAMIN Take 1,000 mcg by mouth daily.      Allergies  Allergen Reactions   Ampicillin Hives and Other (See Comments)    Severe reaction in February 2017 1.5 month to have hives to go away   Penicillins Other (See Comments)    Severe reaction in February 2017 1.5 month to have hives to go away Has patient had a PCN reaction causing immediate rash, facial/tongue/throat swelling, SOB or lightheadedness with hypotension: #  #  #  YES  #  #  #  Has patient had a PCN reaction causing severe rash involving mucus membranes or skin necrosis: No Has patient  had a PCN reaction that required hospitalization No Has patient had a PCN reaction occurring within the last 10 years: No    Gabapentin Swelling   Morphine And Related Itching    Patient states she is not allergic to morphine   Other Itching    UNSPECIFIED Analgesics   Codeine Nausea And Vomiting   Hydrocodone-Acetaminophen Itching   Hydromorphone Itching    Pt has taken w/o issues   Follow-up Information    Henson, Vickie L, NP-C. Schedule an appointment as soon as possible for a visit in 1 week(s).   Specialty: Family Medicine Contact information: [place] [address] [phone]        Thayer Headings, MD. Call.   Specialty: Infectious Diseases Why: Call this office to make an appointment for follow up regarding your CAT scan of your chest Contact information: [address] [place] [address] [phone]            The results of significant diagnostics from this hospitalization (including imaging, microbiology, ancillary and laboratory) are listed below for reference.    Significant Diagnostic Studies: Dg Chest 2 View  Result Date: 03/01/2019 CLINICAL DATA:  Possible sepsis. EXAM: CHEST - 2 VIEW COMPARISON:  02/19/2019 FINDINGS: The lungs are clear without focal pneumonia, edema, pneumothorax or pleural  effusion. Lungs are hyperexpanded suggesting emphysema. The cardiopericardial silhouette is within normal limits for size. IMPRESSION: No active cardiopulmonary disease. Electronically Signed   By: Misty Stanley M.D.   On: 03/01/2019 17:31   Dg Chest 2 View  Result Date: 02/19/2019 CLINICAL DATA:  Persistent cough EXAM: CHEST - 2 VIEW COMPARISON:  04/20/2018 FINDINGS: Borderline enlargement of cardiac silhouette. Mediastinal contours and pulmonary vascularity normal. Atherosclerotic calcification aorta. Bibasilar infiltrates greater on LEFT question pneumonia. Remaining lungs clear. No pleural effusion or pneumothorax. Bones demineralized. IMPRESSION: Bibasilar infiltrates greater on LEFT question pneumonia. Electronically Signed   By: Lavonia Dana M.D.   On: 02/19/2019 16:35   Ct Head Wo Contrast  Result Date: 03/01/2019 CLINICAL DATA:  Altered level of consciousness. EXAM: CT HEAD WITHOUT CONTRAST TECHNIQUE: Contiguous axial images were obtained from the base of the skull through the vertex without intravenous contrast. COMPARISON:  05/17/2015 FINDINGS: Brain: Stable age related cerebral atrophy, ventriculomegaly and advanced periventricular white matter disease. No extra-axial fluid collections are identified. No CT findings for acute hemispheric infarction or intracranial hemorrhage. No mass lesions. The brainstem and cerebellum are normal. Vascular: No hyperdense vessels or aneurysm. Stable vascular calcifications. Skull: No skull fracture or bone lesions. Sinuses/Orbits: The paranasal sinuses and mastoid air cells are clear. The globes are intact. Other: No scalp lesions or hematoma. IMPRESSION: 1. No acute intracranial findings or mass lesions. 2. Stable appearing age advanced periventricular white matter disease. Electronically Signed   By: Marijo Sanes M.D.   On: 03/01/2019 19:39   Ct Chest Wo Contrast  Result Date: 03/01/2019 CLINICAL DATA:  Exertional shortness of breath EXAM: CT CHEST  WITHOUT CONTRAST TECHNIQUE: Multidetector CT imaging of the chest was performed following the standard protocol without IV contrast. COMPARISON:  06/01/2018 FINDINGS: Cardiovascular: Scattered coronary artery and aortic atherosclerosis. Heart is normal size. Aorta is normal caliber. Mediastinum/Nodes: No mediastinal, hilar, or axillary adenopathy. Lungs/Pleura: Moderate centrilobular emphysema. Areas of scarring in the lingula and right middle lobe. Tree-in-bud nodular densities noted in the lingula and right middle lobe. No effusions. Upper Abdomen: Imaging into the upper abdomen shows no acute findings. Musculoskeletal: Chest wall soft tissues are unremarkable. No  acute bony abnormality. IMPRESSION: Scattered coronary artery and aortic calcifications. 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 Aortic Atherosclerosis (ICD10-I70.0). Electronically Signed   By: Rolm Baptise M.D.   On: 03/01/2019 23:06   Mr Brain Wo Contrast  Result Date: 03/02/2019 CLINICAL DATA:  Encephalopathy. Pneumonia. EXAM: MRI HEAD WITHOUT CONTRAST TECHNIQUE: Multiplanar, multiecho pulse sequences of the brain and surrounding structures were obtained without intravenous contrast. COMPARISON:  Head CT 03/01/2019 FINDINGS: BRAIN: There is no acute infarct, acute hemorrhage or extra-axial collection. Diffuse confluent hyperintense T2-weighted signal within the periventricular, deep and juxtacortical white matter, most commonly due to chronic ischemic microangiopathy. There is generalized atrophy without lobar predilection. The midline structures are normal. VASCULAR: The major intracranial arterial and venous sinus flow voids are normal. Susceptibility-sensitive sequences show no chronic microhemorrhage or superficial siderosis. SKULL AND UPPER CERVICAL SPINE: Calvarial bone marrow signal is normal. There is no skull base mass. The  visualized upper cervical spine and soft tissues are normal. SINUSES/ORBITS: There are no fluid levels or advanced mucosal thickening. The mastoid air cells and middle ear cavities are free of fluid. The orbits are normal. IMPRESSION: 1. No acute intracranial abnormality. 2. Advanced chronic small vessel disease. 3. Generalized atrophy without lobar predilection. Electronically Signed   By: Ulyses Jarred M.D.   On: 03/02/2019 01:41    Microbiology: Recent Results (from the past 240 hour(s))  Blood Culture (routine x 2)     Status: None (Preliminary result)   Collection Time: 03/01/19  5:20 PM   Specimen: BLOOD  Result Value Ref Range Status   Specimen Description BLOOD RIGHT ANTECUBITAL  Final   Special Requests AEROBIC BOTTLE ONLY Blood Culture adequate volume  Final   Culture   Final    NO GROWTH 3 DAYS Performed at [facility], [address], [place], [place] [address]    Report Status PENDING  Incomplete  Blood Culture (routine x 2)     Status: None (Preliminary result)   Collection Time: 03/01/19  6:54 PM   Specimen: BLOOD  Result Value Ref Range Status   Specimen Description BLOOD RIGHT ANTECUBITAL  Final   Special Requests   Final    BOTTLES DRAWN AEROBIC AND ANAEROBIC Blood Culture adequate volume   Culture   Final    NO GROWTH 3 DAYS Performed at [facility], [place] [address] [place], [place] [address]    Report Status PENDING  Incomplete  SARS CORONAVIRUS 2 (TAT 6-24 HRS) Nasopharyngeal Nasopharyngeal Swab     Status: None   Collection Time: 03/01/19  6:55 PM   Specimen: Nasopharyngeal Swab  Result Value Ref Range Status   SARS Coronavirus 2 NEGATIVE NEGATIVE Final    Comment: (NOTE) SARS-CoV-2 target nucleic acids are NOT DETECTED. The SARS-CoV-2 RNA is generally detectable in upper and lower respiratory specimens during the acute phase of infection. Negative results do not preclude SARS-CoV-2 infection, do not rule out co-infections with other  pathogens, and should not be used as the sole basis for treatment or other patient management decisions. Negative results must be combined with clinical observations, patient history, and epidemiological information. The expected result is Negative. Fact Sheet for Patients: SugarRoll.be Fact Sheet for Healthcare Providers: https://www.woods-mathews.com/ This test is not yet approved or cleared by the [place] FDA and  has been authorized for detection and/or diagnosis of SARS-CoV-2 by FDA under an Emergency Use Authorization (EUA).  This EUA will remain  in effect (meaning this test can be used) for the duration of the COVID-19 declaration under Section 56 4(b)(1) of the Act, 21 U.S.C. section 360bbb-3(b)(1), unless the authorization is terminated or revoked sooner. Performed at [facility], [place] [address], [place], [place] [address]   Urine culture     Status: Abnormal   Collection Time: 03/01/19 11:56 PM   Specimen: In/Out Cath Urine  Result Value Ref Range Status   Specimen Description IN/OUT CATH URINE  Final   Special Requests   Final    NONE Performed at [facility], [place] [address] [place], [place] [address]    Culture MULTIPLE SPECIES PRESENT, SUGGEST RECOLLECTION (A)  Final   Report Status 03/03/2019 FINAL  Final  MRSA PCR Screening     Status: None   Collection Time: 03/02/19 12:43 PM   Specimen: Nasal Mucosa; Nasopharyngeal  Result Value Ref Range Status   MRSA by PCR NEGATIVE NEGATIVE Final    Comment:        The GeneXpert MRSA Assay (FDA approved for NASAL specimens only), is one component of a comprehensive MRSA colonization surveillance program. It is not intended to diagnose MRSA infection nor to guide or monitor treatment for MRSA infections. Performed at [facility], [place] [address] [place], [place] [address]      Labs: Basic Metabolic Panel: Recent Labs  Lab 03/01/19 1900  03/02/19 0550 03/03/19 0335 03/04/19 0315  NA 136 140 140 141  K 5.5* 5.1 5.1 4.8  CL 106 110 113* 110  CO2 19* 19* 19* 19*  GLUCOSE 129* 111* 127* 139*  BUN 36* 30* 24* 23  CREATININE 1.65* 1.39* 1.18* 1.28*  CALCIUM 9.5 9.4 9.4 9.4  MG  --  1.8  --   --    Liver Function Tests: Recent Labs  Lab 03/01/19 1900  AST 21  ALT 24  ALKPHOS 87  BILITOT 0.8  PROT 7.0  ALBUMIN 4.1   No results for input(s): LIPASE, AMYLASE in the last 168 hours. Recent Labs  Lab 03/01/19 1900  AMMONIA 28   CBC: Recent Labs  Lab 03/01/19 1718 03/01/19 1900 03/02/19 0550 03/03/19 0335 03/04/19 0315  WBC 9.0 9.7 8.9 6.7 7.0  NEUTROABS 6.4 7.0 5.3 3.7 3.5  HGB 11.9* 11.9* 10.6* 10.3* 10.2*  HCT 37.9 37.0 33.7* 33.1* 31.8*  MCV 103.0* 102.8* 103.1* 103.8* 101.0*  PLT 322 301 287 278 271   Cardiac Enzymes: No results for input(s): CKTOTAL, CKMB, CKMBINDEX, TROPONINI in the last 168 hours. BNP: BNP (last 3 results) Recent Labs    11/07/18 1520  BNP 51.8    ProBNP (last 3 results) No results for input(s): PROBNP in the last 8760 hours.  CBG: No results for input(s): GLUCAP in the last 168 hours.     Signed:  Alma Friendly, MD [facility] 03/04/2019, 11:55 AM

## 2019-03-04 NOTE — Progress Notes (Signed)
Pt given discharge instructions, prescriptions, and care notes. Pt verbalized understanding AEB no further questions or concerns at this time. IV was discontinued, no redness, pain, or swelling noted at this time. Telemetry discontinued and Centralized Telemetry was notified. Pt left the floor via wheelchair with staff in stable condition. 

## 2019-03-05 ENCOUNTER — Other Ambulatory Visit: Payer: Self-pay | Admitting: Internal Medicine

## 2019-03-05 ENCOUNTER — Telehealth: Payer: Self-pay

## 2019-03-05 DIAGNOSIS — J189 Pneumonia, unspecified organism: Secondary | ICD-10-CM

## 2019-03-05 LAB — LEGIONELLA PNEUMOPHILA SEROGP 1 UR AG: L. pneumophila Serogp 1 Ur Ag: NEGATIVE

## 2019-03-05 LAB — ACID FAST SMEAR (AFB, MYCOBACTERIA): Acid Fast Smear: NEGATIVE

## 2019-03-05 NOTE — Telephone Encounter (Signed)
Called pt. To schedule f/u and she said she was going to the beach and also has a f/u already scheduled with Vickie on 03/21/19. She said she could come in on 03/18/19 I told her I would check with Vickie to see if she needs to come in before 03/21/19.

## 2019-03-05 NOTE — Care Management Important Message (Signed)
Important Message  Patient Details  Name: Brandi Stephens MRN: [id number] Date of Birth: 03-Sep-1945   Medicare Important Message Given:  Yes     Elwood Bazinet [address] 03/05/2019, 11:41 AM

## 2019-03-05 NOTE — Telephone Encounter (Signed)
Okay to schedule with me

## 2019-03-05 NOTE — Telephone Encounter (Signed)
[facility] called pt to make appt and will cancel dec. Appointment . [place]

## 2019-03-05 NOTE — Telephone Encounter (Signed)
Vickie has a pt. Aamira Gunnell that needs a hospital f/u scheduled for next week, but Loletha Carrow is not going to be here next week, she wanted me to ask you if you could see her for her hospital f/u. Pt. Was in the hospital for altered mental status, slurring speech, and tremor in hands. Please advise.

## 2019-03-05 NOTE — Telephone Encounter (Signed)
Pt scheduled for 11/30

## 2019-03-06 LAB — CULTURE, BLOOD (ROUTINE X 2)
Culture: NO GROWTH
Culture: NO GROWTH
Special Requests: ADEQUATE
Special Requests: ADEQUATE

## 2019-03-07 ENCOUNTER — Encounter: Payer: Self-pay | Admitting: Internal Medicine

## 2019-03-07 ENCOUNTER — Other Ambulatory Visit: Payer: Self-pay

## 2019-03-07 ENCOUNTER — Ambulatory Visit (INDEPENDENT_AMBULATORY_CARE_PROVIDER_SITE_OTHER): Payer: PPO | Admitting: Internal Medicine

## 2019-03-07 VITALS — BP 141/83 | HR 106 | Temp 98.1°F | Wt 254.0 lb

## 2019-03-07 DIAGNOSIS — R9389 Abnormal findings on diagnostic imaging of other specified body structures: Secondary | ICD-10-CM | POA: Diagnosis not present

## 2019-03-07 NOTE — Progress Notes (Signed)
[facility]  Reason for Consult: Possible mycobacterial pneumonia Referring Provider: Mack Hook, NP  Assessment: She has had a recent smoldering respiratory illness and probable pneumonia but is now clinically improved.  The tree-in-bud nodularity seen on her recent CT scan is nonspecific.  A sputum AFB smear obtained in the hospital was negative and AFB cultures are pending.  I certainly see no indication to start empiric therapy for Mycobacterium avium or other infections at this time.  She will follow-up with me in 1 month to review her clinical status and final AFB cultures.  Plan: 1. Observe off of antibiotics 2. Follow-up in 1 month  Patient Active Problem List   Diagnosis Date Noted  . Abnormal CT of the chest 06/04/2018    Priority: High  . Paresthesia 02/12/2019  . Low back pain 02/12/2019  . Neuropathy 12/20/2018  . Lumbar spinal stenosis 06/29/2018  . Preoperative clearance 06/04/2018  . Smoker 05/17/2018  . Hypothyroidism 11/30/2017  . Thoracic aortic atherosclerosis ([place]) 09/14/2016  . Benign essential tremor 09/09/2016  . RLS (restless legs syndrome) 09/09/2016  . H/O adenomatous polyp of colon 07/06/2016  . PVC's (premature ventricular contractions) 03/29/2016  . Preoperative cardiovascular examination 03/29/2016  . Palpitations 01/11/2016  . Shortness of breath 01/11/2016  . Former cigarette smoker 01/11/2016  . Leg edema 01/11/2016  . Chronic cough 10/28/2015  . 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 Malignant neoplasm of upper-inner quadrant of left breast in female, estrogen receptor positive ([place]) 07/14/2015  . Prediabetes 07/08/2015  . CKD (chronic kidney disease), stage III 07/08/2015  . Dependent edema 06/08/2015  . Sleep disturbance 06/08/2015  . Mixed hyperlipidemia 06/08/2015  . Generalized anxiety disorder 06/08/2015  . Essential hypertension 06/08/2015  . Emphysema lung ([place]) 06/08/2015  . Gout  06/08/2015  . Macrocytic anemia   . 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 Sepsis ([place]) 05/15/2015  . Lumbar spondylosis 03/20/2015    Patient's Medications  New Prescriptions   No medications on file  Previous Medications   ALBUTEROL (PROVENTIL HFA;VENTOLIN HFA) 108 (90 BASE) MCG/ACT INHALER    INHALE 2 PUFFS BY MOUTH EVERY 4 TO 6 HOURS AS NEEDED   ALLOPURINOL (ZYLOPRIM) 300 MG TABLET    Take 1 tablet (300 mg total) by mouth daily.   ANASTROZOLE (ARIMIDEX) 1 MG TABLET    TAKE 1 TABLET(1 MG) BY MOUTH DAILY   ATORVASTATIN (LIPITOR) 20 MG TABLET    TAKE 1 TABLET BY MOUTH EVERY DAY   BACLOFEN (LIORESAL) 10 MG TABLET    Take 1 tablet (10 mg) once daily as needed.   BIOTIN (BIOTIN 5000) 5 MG CAPS    Take 5 mg by mouth daily.    BUSPIRONE (BUSPAR) 15 MG TABLET    Take 15-30 mg by mouth See admin instructions. Take one tablet in the morning and two tablets in the evening   CALCIUM CARBONATE-VITAMIN D (CALCIUM 500/D PO)    Take 2 each by mouth daily.    COLCHICINE 0.6 MG TABLET    Take 2 tablets now and 1 tablet an hour later for flare up   DICLOFENAC SODIUM (VOLTAREN) 1 % GEL    APPLY 4 GRAMS EXTERNALLY TO THE AFFECTED AREA FOUR TIMES DAILY   DULOXETINE (CYMBALTA) 60 MG CAPSULE    Take 1 capsule (60 mg total) by mouth daily.   FUROSEMIDE (LASIX) 20 MG TABLET    TAKE 1 TABLET BY MOUTH DAILY AS NEEDED   HYDROXYZINE (  VISTARIL) 25 MG CAPSULE    Take 25 mg by mouth 2 (two) times daily as needed for anxiety.   LEVOTHYROXINE (SYNTHROID) 75 MCG TABLET    TAKE 1 TABLET(75 MCG) BY MOUTH DAILY BEFORE BREAKFAST   MAGNESIUM 500 MG CAPS    Take 500 mg by mouth daily.   MEGARED OMEGA-3 KRILL OIL PO    Take by mouth.   MELATONIN 3 MG CAPS    Take 9 mg by mouth at bedtime.   METOPROLOL SUCCINATE (TOPROL-XL) 25 MG 24 HR TABLET    TAKE 1/2 TABLET(12.5 MG) BY MOUTH DAILY   OLMESARTAN-HYDROCHLOROTHIAZIDE (BENICAR HCT) 40-12.5 MG TABLET    TAKE 1 TABLET BY MOUTH DAILY   POLYETHYLENE GLYCOL POWDER (MIRALAX) POWDER     Take 17 g by mouth daily as needed for moderate constipation.    PRIMIDONE (MYSOLINE) 50 MG TABLET    TAKE 1 TABLET(50 MG) BY MOUTH AT BEDTIME   ROPINIROLE (REQUIP) 0.5 MG TABLET    Take 1.5 tablets (0.75 mg total) by mouth at bedtime.   VITAMIN B-12 (CYANOCOBALAMIN) 1000 MCG TABLET    Take 1,000 mcg by mouth daily.  Modified Medications   No medications on file  Discontinued Medications   No medications on file    HPI: Brandi Stephens is a 73 y.o. female with multiple medical problems who developed acute respiratory symptoms in September.  She initially thought that these were due to her seasonal allergies.  She says she then developed a "cold" that turned into bronchitis.  She was treated with ciprofloxacin without much benefit.  She was then treated with a Z-Pak and finally with a sulfa antibiotic.  She was also treated with prednisone.  A chest x-ray on 02/19/2019 did show mild bibasilar infiltrates.  Over the next week she improved and felt like her pneumonia had resolved.  She became acutely confused and was hallucinating leading to admission to the hospital on 03/01/2019.  She was initially hypotensive with confusion, hallucinations and paranoid behavior.  She improved rapidly in the emergency department.  Testing for COVID-19 was negative.  She was afebrile and her chest x-ray was now clear.  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  She was started on empiric antibiotics and continued to improve.  Blood cultures were negative.  She was discharged 3 days later off of antibiotics and prednisone.  She has had recurrent, mild to dry cough since that time.  She has not had any new shortness of breath or fever.  Review of Systems: Review of Systems  Constitutional: Negative for chills, diaphoresis, fever and weight loss.   HENT: Negative for congestion and sore throat.   Respiratory: Positive for cough. Negative for hemoptysis, sputum production, shortness of breath and wheezing.   Cardiovascular: Negative for chest pain.  Gastrointestinal: Negative for abdominal pain, diarrhea, nausea and vomiting.  Musculoskeletal: Positive for back pain and neck pain.  Skin: Negative for rash.  Neurological: Negative for headaches.      Past Medical History:  Diagnosis Date  . AIN (anal intraepithelial neoplasia) anal canal   . Anemia    with pregnancy  . Anxiety   . Arthritis    knees, lower back, knees  . Chronic constipation   . Coarse tremors    Essential  . COPD (chronic obstructive pulmonary disease) ([place])  2017 chest CT, history of  . Degenerative lumbar spinal stenosis   . GERD (gastroesophageal reflux disease)    pt denies  . Gout   . Grade I diastolic dysfunction 0000000   Noted ECHO  . Hemorrhoids   . History of adenomatous polyp of colon   . History of basal cell carcinoma (BCC) excision 2015   nose  . History of sepsis 05/14/2014   post lumbar surgery  . History of shingles    x2  . Hyperlipidemia   . Hypertension   . Hypothyroidism   . Irregular heart beat    history of while undergoing radiation  . Malignant neoplasm of upper-inner quadrant of left breast in female, estrogen receptor positive [facility]) oncologist-  dr Jana Hakim--  per lov in epic no recurrence   dx 07-13-2015  Left breast invasive ductal carcinoma, Stage IA, Grade 1 (TXN0),  09-16-2015  s/p  left breast lumpectomy with sln bx's,  completed radiation 11-18-2015,  started antiestrogen therapy 12-14-2015  . Neuropathy    Left foot and back of left leg  . Personal history of radiation therapy    completed 11-18-2015  left breast  . Pneumonia   . PONV (postoperative nausea and vomiting)   . Prediabetes    diet controlled, no med  . Restless leg syndrome   . Seasonal allergies   . Seizure ([place]) 05/2015   due to  sepsis, only one time episode  . Tremor   . Wears partial dentures    lower     Social History   Tobacco Use  . Smoking status: Former Smoker    Packs/day: 0.50    Years: 35.00    Pack years: 17.50    Types: Cigarettes    Quit date: 05/22/2018    Years since quitting: 0.7  . Smokeless tobacco: Never Used  Substance Use Topics  . Alcohol use: Yes    Comment: 1 glass of wine a night  . Drug use: Never    Comment: CBD tincture    Family History  Problem Relation Age of Onset  . Atrial fibrillation Mother   . Ulcers Father   . Breast cancer Maternal Aunt   . Lung cancer Maternal Uncle   . Stomach cancer Maternal Grandmother   . Lung cancer Maternal Aunt   . Brain cancer Maternal Aunt   . Prostate cancer Maternal Grandfather   . Stroke Paternal Grandmother   . Tuberculosis Paternal Grandfather   . Colon cancer Neg Hx   . Esophageal cancer Neg Hx   . Rectal cancer Neg Hx    Allergies  Allergen Reactions  . Ampicillin Hives and Other (See Comments)    Severe reaction in February 2017 1.5 month to have hives to go away  . Penicillins Other (See Comments)    Severe reaction in February 2017 1.5 month to have hives to go away Has patient had a PCN reaction causing immediate rash, facial/tongue/throat swelling, SOB or lightheadedness with hypotension: #  #  #  YES  #  #  #  Has patient had a PCN reaction causing severe rash involving mucus membranes or skin necrosis: No Has patient had a PCN reaction that required hospitalization No Has patient had a PCN reaction occurring within the last 10 years: No   . Gabapentin Swelling  . Morphine And Related Itching    Patient states she is not allergic to morphine  . Other Itching    UNSPECIFIED Analgesics  . Codeine Nausea And  Vomiting  . Hydrocodone-Acetaminophen Itching  . Hydromorphone Itching    Pt has taken w/o issues    OBJECTIVE: Vitals:   03/07/19 1406  BP: (!) 141/83  Pulse: (!) 106  Temp: 98.1 F (36.7 C)   TempSrc: Oral  Weight: 254 lb (115.2 kg)   Body mass index is 39.19 kg/m.   Physical Exam Constitutional:      Comments: She is pleasant and in no distress.  She is accompanied by her daughter, [name].  Cardiovascular:     Rate and Rhythm: Normal rate and regular rhythm.     Heart sounds: No murmur.  Pulmonary:     Effort: Pulmonary effort is normal.     Breath sounds: Normal breath sounds. No wheezing, rhonchi or rales.  Abdominal:     Palpations: Abdomen is soft.     Tenderness: There is no abdominal tenderness.  Skin:    Findings: No rash.  Neurological:     General: No focal deficit present.  Psychiatric:        Mood and Affect: Mood normal.     Microbiology: Recent Results (from the past 240 hour(s))  Blood Culture (routine x 2)     Status: None   Collection Time: 03/01/19  5:20 PM   Specimen: BLOOD  Result Value Ref Range Status   Specimen Description BLOOD RIGHT ANTECUBITAL  Final   Special Requests AEROBIC BOTTLE ONLY Blood Culture adequate volume  Final   Culture   Final    NO GROWTH 5 DAYS Performed at [facility], [address], [place], [place] [address]    Report Status 03/06/2019 FINAL  Final  Blood Culture (routine x 2)     Status: None   Collection Time: 03/01/19  6:54 PM   Specimen: BLOOD  Result Value Ref Range Status   Specimen Description BLOOD RIGHT ANTECUBITAL  Final   Special Requests   Final    BOTTLES DRAWN AEROBIC AND ANAEROBIC Blood Culture adequate volume   Culture   Final    NO GROWTH 5 DAYS Performed at [facility], [place] [address] [place], [place] [address]    Report Status 03/06/2019 FINAL  Final  SARS CORONAVIRUS 2 (TAT 6-24 HRS) Nasopharyngeal Nasopharyngeal Swab     Status: None   Collection Time: 03/01/19  6:55 PM   Specimen: Nasopharyngeal Swab  Result Value Ref Range Status   SARS Coronavirus 2 NEGATIVE NEGATIVE Final    Comment: (NOTE) SARS-CoV-2 target nucleic acids are NOT DETECTED. The SARS-CoV-2  RNA is generally detectable in upper and lower respiratory specimens during the acute phase of infection. Negative results do not preclude SARS-CoV-2 infection, do not rule out co-infections with other pathogens, and should not be used as the sole basis for treatment or other patient management decisions. Negative results must be combined with clinical observations, patient history, and epidemiological information. The expected result is Negative. Fact Sheet for Patients: SugarRoll.be Fact Sheet for Healthcare Providers: https://www.woods-mathews.com/ This test is not yet approved or cleared by the [place] FDA and  has been authorized for detection and/or diagnosis of SARS-CoV-2 by FDA under an Emergency Use Authorization (EUA). This EUA will remain  in effect (meaning this test can be used) for the duration of the COVID-19 declaration under Section 56 4(b)(1) of the Act, 21 U.S.C. section 360bbb-3(b)(1), unless the authorization is terminated or revoked sooner. Performed at [facility], [place] [address], [place], [place] [address]   Urine culture  Status: Abnormal   Collection Time: 03/01/19 11:56 PM   Specimen: In/Out Cath Urine  Result Value Ref Range Status   Specimen Description IN/OUT CATH URINE  Final   Special Requests   Final    NONE Performed at [facility], [address], [place], [place] [address]    Culture MULTIPLE SPECIES PRESENT, SUGGEST RECOLLECTION (A)  Final   Report Status 03/03/2019 FINAL  Final  MRSA PCR Screening     Status: None   Collection Time: 03/02/19 12:43 PM   Specimen: Nasal Mucosa; Nasopharyngeal  Result Value Ref Range Status   MRSA by PCR NEGATIVE NEGATIVE Final    Comment:        The GeneXpert MRSA Assay (FDA approved for NASAL specimens only), is one component of a comprehensive MRSA colonization surveillance program. It is not intended to diagnose MRSA infection nor to  guide or monitor treatment for MRSA infections. Performed at [facility], [place] [address], [place], [place] [address]   Culture, Urine     Status: None   Collection Time: 03/03/19  6:40 PM   Specimen: Urine, Clean Catch  Result Value Ref Range Status   Specimen Description URINE, CLEAN CATCH  Final   Special Requests NONE  Final   Culture   Final    NO GROWTH Performed at [facility], [place] [address], [place], [place] [address]    Report Status 03/04/2019 FINAL  Final  Acid Fast Smear (AFB)     Status: None   Collection Time: 03/04/19  3:15 AM   Specimen: Sputum  Result Value Ref Range Status   AFB Specimen Processing Concentration  Final   Acid Fast Smear Negative  Final    Comment: (NOTE) Performed At: [facility] [place], [place] [id number] Rush Farmer MD [id number]    Source (AFB) EXPECTORATED SPUTUM  Corrected    Comment: Performed at [facility], [place] [address], [place], [place] [address] CORRECTED ON 11/16 AT 1427: PREVIOUSLY REPORTED AS RESISTANT     [name], MD [facility] for [facility] [phone] pager   [phone] cell 03/07/2019, 5:37 PM

## 2019-03-17 ENCOUNTER — Other Ambulatory Visit: Payer: Self-pay | Admitting: Internal Medicine

## 2019-03-18 ENCOUNTER — Ambulatory Visit (INDEPENDENT_AMBULATORY_CARE_PROVIDER_SITE_OTHER): Payer: PPO | Admitting: Family Medicine

## 2019-03-18 ENCOUNTER — Encounter: Payer: Self-pay | Admitting: Family Medicine

## 2019-03-18 ENCOUNTER — Other Ambulatory Visit: Payer: Self-pay

## 2019-03-18 VITALS — BP 122/84 | HR 100 | Temp 97.8°F | Wt 253.6 lb

## 2019-03-18 DIAGNOSIS — Z8619 Personal history of other infectious and parasitic diseases: Secondary | ICD-10-CM

## 2019-03-18 DIAGNOSIS — M545 Low back pain, unspecified: Secondary | ICD-10-CM

## 2019-03-18 DIAGNOSIS — R609 Edema, unspecified: Secondary | ICD-10-CM

## 2019-03-18 DIAGNOSIS — M1000 Idiopathic gout, unspecified site: Secondary | ICD-10-CM

## 2019-03-18 DIAGNOSIS — Z09 Encounter for follow-up examination after completed treatment for conditions other than malignant neoplasm: Secondary | ICD-10-CM

## 2019-03-18 DIAGNOSIS — E039 Hypothyroidism, unspecified: Secondary | ICD-10-CM | POA: Diagnosis not present

## 2019-03-18 DIAGNOSIS — J189 Pneumonia, unspecified organism: Secondary | ICD-10-CM | POA: Diagnosis not present

## 2019-03-18 DIAGNOSIS — R7303 Prediabetes: Secondary | ICD-10-CM

## 2019-03-18 DIAGNOSIS — N1832 Chronic kidney disease, stage 3b: Secondary | ICD-10-CM

## 2019-03-18 DIAGNOSIS — R202 Paresthesia of skin: Secondary | ICD-10-CM

## 2019-03-18 DIAGNOSIS — G629 Polyneuropathy, unspecified: Secondary | ICD-10-CM | POA: Diagnosis not present

## 2019-03-18 DIAGNOSIS — I10 Essential (primary) hypertension: Secondary | ICD-10-CM | POA: Diagnosis not present

## 2019-03-18 MED ORDER — METOPROLOL SUCCINATE ER 25 MG PO TB24: ORAL_TABLET | ORAL | 0 refills | Status: DC

## 2019-03-18 MED ORDER — BUSPIRONE HCL 15 MG PO TABS: 15.0000 mg | ORAL_TABLET | ORAL | 2 refills | Status: DC

## 2019-03-18 MED ORDER — DICLOFENAC SODIUM 1 % EX GEL: 4.0000 g | Freq: Four times a day (QID) | CUTANEOUS | 1 refills | Status: DC

## 2019-03-18 MED ORDER — DULOXETINE HCL 60 MG PO CPEP: 60.0000 mg | ORAL_CAPSULE | Freq: Every day | ORAL | 2 refills | Status: DC

## 2019-03-18 NOTE — Patient Instructions (Signed)
It was a pleasure seeing you today.   As discussed, call your orthopedic surgeon regarding your back pain.   Follow up with Dr. Megan Salon as scheduled.   Call [facility], [place], to discuss medication assistance. You may want to ask them about the Pill Pack program.   Continue your current medications for now.   We will be in touch with your lab results.

## 2019-03-18 NOTE — Progress Notes (Signed)
Subjective:    Patient ID: Brandi Stephens, female    DOB: Jul 29, 1945, 73 y.o.   MRN: [id number]  HPI Chief Complaint  Patient presents with  . ER follow-up    ER follow-up and 3 month follow-up. needs refills on some meds. see med list notes   Recently admitted to the hospital on 03/01/2019 for altered mental status that was later discovered to be related to sepsis due to atypical pneumonia. She was discharged from the hospital on 03/04/2019.  She saw Dr. [name] in infectious disease on 03/07/2019 for follow-up of abnormal chest CT.  States since being discharged from the hospital she has felt quite good.  Denies coughing or shortness of breath. She had 36 hours of nausea and diarrhea last week but this resolved.   She denies any fever, chills, night sweats, headache, dizziness, chest pain, palpitations, abdominal pain, nausea, vomiting, diarrhea.  CKD-we are keeping a close eye on kidney function.  Trying to avoid NSAIDs  LE edema-states she is having mild edema due to sitting more recently.  She takes Lasix as needed.  Has not taken Lasix lately.  Prediabetes- Hgb A1c 6.0% in 12/2018  Gout-no recent flares.  States she is taking allopurinol 300 mg without any side effects.  Paresthesias and neuropathy- she saw Dr. Krista Blue and states her cymbalta was increased to 60 mg and this has helped    HTN-states her blood pressure was elevated in the hospital but seems to have normalized again.  Reports taking daily medications without any side effects.  Chronic back pain-states her back pain has been worse since being home from the hospital.  She plans to call her orthopedic surgeon to discuss this.  [facility] has been consulted to do a home visit. She will call them back to    Other doctors caring for patient include: Dr. Jana Hakim- oncology Dr. Krista Blue- neurology  Tammy Parrett- Pulmonary Dr. Kathyrn Sheriff- Neurosurgery  Mickel Baas- Dermatology Dr. Hickey-cardiologist   Review of Systems  Pertinent positives and negatives in the history of present illness.     Objective:   Physical Exam BP 122/84   Pulse 100   Temp 97.8 F (36.6 C)   Wt 253 lb 9.6 oz (115 kg)   BMI 39.13 kg/m   Alert and oriented and in no distress.  Cardiac exam shows a regular rhythm without murmurs or gallops. Lungs are clear to auscultation.  Normal respirations.  Mild edema to bilateral lower extremities, worse on the left.  Skin is warm and dry.  Normal speech, mood and thought process.       Assessment & Plan:  Hospital discharge follow-up - Plan: CBC with Differential, Comprehensive metabolic panel -She was discharged from the hospital on 03/04/2019 and is following up today.  She was on vacation with her family at the beach last week and had an enjoyable time.  She is in good spirits today.  She denies any new concerns.  Reports taking all of her daily medications without difficulty.  No side effects reported.  Medications reconciled. I will check labs per discharge summary recommendations.   Atypical pneumonia -She has been seen by Dr. Megan Salon, ID, and has a follow-up appointment with him in 3 weeks.  She is currently not on antibiotics and doing well.  History of sepsis - Plan: CBC with Differential, Comprehensive metabolic panel -Appears to be back to baseline.  Encouraged taking good care of herself including staying well-hydrated and staying on top of any new or worsening  symptoms.  Essential hypertension - Plan: CBC with Differential, Comprehensive metabolic panel -Well-controlled and back in normal range.  Continue on current medications.  Dependent edema -This is mild today.  She has not been taking Lasix and will let me know if the edema worsens.  We may need to consider starting her back on a diuretic.  Stage 3b chronic kidney disease - Plan: Comprehensive metabolic panel -Check labs in follow-up.  She will continue to avoid NSAIDs.  I will have a low threshold to refer to  nephrology if her creatinine spikes.  Hypothyroidism, unspecified type - Plan: TSH, T4, Free -Check labs and continue on current dose of levothyroxine until results are available.  Adjust dose as appropriate  Neuropathy -Appears to be improving with higher dose of Cymbalta.  Followed by Dr. Halford Decamp, neurology.  Low back pain, unspecified back pain laterality, unspecified chronicity, unspecified whether sciatica present  Paresthesia-improved with Cymbalta  Idiopathic gout, unspecified chronicity, unspecified site - Plan: Uric Acid -Appears to be doing well on allopurinol 300 mg.  No recent gout flares.  Check uric acid level.  Prediabetes-recent hemoglobin A1c 6.0% in 2 soon to recheck.

## 2019-03-19 ENCOUNTER — Other Ambulatory Visit: Payer: Self-pay | Admitting: Internal Medicine

## 2019-03-19 DIAGNOSIS — E875 Hyperkalemia: Secondary | ICD-10-CM

## 2019-03-19 LAB — COMPREHENSIVE METABOLIC PANEL
ALT: 19 IU/L (ref 0–32)
AST: 18 IU/L (ref 0–40)
Albumin/Globulin Ratio: 2.2 (ref 1.2–2.2)
Albumin: 4.4 g/dL (ref 3.7–4.7)
Alkaline Phosphatase: 101 IU/L (ref 39–117)
BUN/Creatinine Ratio: 22 (ref 12–28)
BUN: 28 mg/dL — ABNORMAL HIGH (ref 8–27)
Bilirubin Total: 0.3 mg/dL (ref 0.0–1.2)
CO2: 24 mmol/L (ref 20–29)
Calcium: 9.7 mg/dL (ref 8.7–10.3)
Chloride: 107 mmol/L — ABNORMAL HIGH (ref 96–106)
Creatinine, Ser: 1.26 mg/dL — ABNORMAL HIGH (ref 0.57–1.00)
GFR calc Af Amer: 49 mL/min/{1.73_m2} — ABNORMAL LOW (ref >59)
GFR calc non Af Amer: 43 mL/min/{1.73_m2} — ABNORMAL LOW (ref >59)
Globulin, Total: 2 g/dL (ref 1.5–4.5)
Glucose: 124 mg/dL — ABNORMAL HIGH (ref 65–99)
Potassium: 5.4 mmol/L — ABNORMAL HIGH (ref 3.5–5.2)
Sodium: 144 mmol/L (ref 134–144)
Total Protein: 6.4 g/dL (ref 6.0–8.5)

## 2019-03-19 LAB — CBC WITH DIFFERENTIAL/PLATELET
Basophils Absolute: 0.1 10*3/uL (ref 0.0–0.2)
Basos: 1 %
EOS (ABSOLUTE): 0.2 10*3/uL (ref 0.0–0.4)
Eos: 3 %
Hematocrit: 33.7 % — ABNORMAL LOW (ref 34.0–46.6)
Hemoglobin: 11.3 g/dL (ref 11.1–15.9)
Immature Grans (Abs): 0.1 10*3/uL (ref 0.0–0.1)
Immature Granulocytes: 1 %
Lymphocytes Absolute: 1.7 10*3/uL (ref 0.7–3.1)
Lymphs: 24 %
MCH: 32.6 pg (ref 26.6–33.0)
MCHC: 33.5 g/dL (ref 31.5–35.7)
MCV: 97 fL (ref 79–97)
Monocytes Absolute: 0.5 10*3/uL (ref 0.1–0.9)
Monocytes: 8 %
Neutrophils Absolute: 4.3 10*3/uL (ref 1.4–7.0)
Neutrophils: 63 %
Platelets: 205 10*3/uL (ref 150–450)
RBC: 3.47 x10E6/uL — ABNORMAL LOW (ref 3.77–5.28)
RDW: 13.2 % (ref 11.7–15.4)
WBC: 6.9 10*3/uL (ref 3.4–10.8)

## 2019-03-19 LAB — TSH: TSH: 1.42 u[IU]/mL (ref 0.450–4.500)

## 2019-03-19 LAB — T4, FREE: Free T4: 1.49 ng/dL (ref 0.82–1.77)

## 2019-03-19 LAB — URIC ACID: Uric Acid: 5.4 mg/dL (ref 2.5–7.1)

## 2019-03-19 NOTE — Telephone Encounter (Signed)
Outpatient Medication Detail   Disp Refills Start End   metoprolol succinate (TOPROL-XL) 25 MG 24 hr tablet 45 tablet 0 03/18/2019    Sig: TAKE 1/2 TABLET(12.5 MG) BY MOUTH DAILY   Sent to pharmacy as: metoprolol succinate (TOPROL-XL) 25 MG 24 hr tablet   Notes to Pharmacy: **Patient requests 90 days supply**   E-Prescribing Status: Receipt confirmed by pharmacy (03/18/2019 1:09 PM EST)   Pharmacy  [facility], [place]

## 2019-03-21 ENCOUNTER — Ambulatory Visit: Payer: PPO | Admitting: Family Medicine

## 2019-03-21 DIAGNOSIS — M4800 Spinal stenosis, site unspecified: Secondary | ICD-10-CM | POA: Diagnosis not present

## 2019-03-21 DIAGNOSIS — M545 Low back pain: Secondary | ICD-10-CM | POA: Diagnosis not present

## 2019-03-22 ENCOUNTER — Other Ambulatory Visit: Payer: PPO

## 2019-03-22 ENCOUNTER — Other Ambulatory Visit: Payer: Self-pay

## 2019-03-22 DIAGNOSIS — E875 Hyperkalemia: Secondary | ICD-10-CM

## 2019-03-22 LAB — BASIC METABOLIC PANEL
BUN/Creatinine Ratio: 27 (ref 12–28)
BUN: 34 mg/dL — ABNORMAL HIGH (ref 8–27)
CO2: 28 mmol/L (ref 20–29)
Calcium: 9.7 mg/dL (ref 8.7–10.3)
Chloride: 106 mmol/L (ref 96–106)
Creatinine, Ser: 1.26 mg/dL — ABNORMAL HIGH (ref 0.57–1.00)
GFR calc Af Amer: 49 mL/min/{1.73_m2} — ABNORMAL LOW (ref >59)
GFR calc non Af Amer: 43 mL/min/{1.73_m2} — ABNORMAL LOW (ref >59)
Glucose: 175 mg/dL — ABNORMAL HIGH (ref 65–99)
Potassium: 5 mmol/L (ref 3.5–5.2)
Sodium: 145 mmol/L — ABNORMAL HIGH (ref 134–144)

## 2019-03-27 ENCOUNTER — Other Ambulatory Visit: Payer: Self-pay | Admitting: Physician Assistant

## 2019-03-27 ENCOUNTER — Other Ambulatory Visit: Payer: Self-pay | Admitting: Family Medicine

## 2019-03-27 DIAGNOSIS — M545 Low back pain, unspecified: Secondary | ICD-10-CM

## 2019-03-27 NOTE — Telephone Encounter (Signed)
Is this okay to refill? 

## 2019-03-31 ENCOUNTER — Other Ambulatory Visit: Payer: Self-pay

## 2019-03-31 ENCOUNTER — Ambulatory Visit
Admission: RE | Admit: 2019-03-31 | Discharge: 2019-03-31 | Disposition: A | Discharge status: Home / Self Care | Payer: PPO | Source: Ambulatory Visit | Attending: Physician Assistant | Admitting: Physician Assistant

## 2019-03-31 DIAGNOSIS — M4802 Spinal stenosis, cervical region: Secondary | ICD-10-CM | POA: Diagnosis not present

## 2019-03-31 DIAGNOSIS — M48061 Spinal stenosis, lumbar region without neurogenic claudication: Secondary | ICD-10-CM | POA: Diagnosis not present

## 2019-03-31 DIAGNOSIS — M542 Cervicalgia: Secondary | ICD-10-CM

## 2019-03-31 DIAGNOSIS — M545 Low back pain, unspecified: Secondary | ICD-10-CM

## 2019-03-31 MED ORDER — GADOBENATE DIMEGLUMINE 529 MG/ML IV SOLN
10.0000 mL | Freq: Once | INTRAVENOUS | Status: AC | PRN
  Administered 2019-03-31: 10 mL via INTRAVENOUS

## 2019-04-04 ENCOUNTER — Other Ambulatory Visit: Payer: Self-pay | Admitting: Neurosurgery

## 2019-04-04 DIAGNOSIS — M48061 Spinal stenosis, lumbar region without neurogenic claudication: Secondary | ICD-10-CM | POA: Diagnosis not present

## 2019-04-08 ENCOUNTER — Encounter: Payer: Self-pay | Admitting: Internal Medicine

## 2019-04-08 ENCOUNTER — Ambulatory Visit (INDEPENDENT_AMBULATORY_CARE_PROVIDER_SITE_OTHER): Payer: PPO | Admitting: Internal Medicine

## 2019-04-08 ENCOUNTER — Other Ambulatory Visit: Payer: Self-pay

## 2019-04-08 DIAGNOSIS — R9389 Abnormal findings on diagnostic imaging of other specified body structures: Secondary | ICD-10-CM | POA: Diagnosis not present

## 2019-04-08 NOTE — Assessment & Plan Note (Signed)
She does not have any respiratory signs or symptoms to suggest active pneumonia.  There is no current indication to do any further diagnostic testing or treatment.  I will call her if her sputum AFB culture returns positive but otherwise she can follow-up here as needed.

## 2019-04-08 NOTE — Progress Notes (Signed)
[facility]  Patient Active Problem List   Diagnosis Date Noted  . Abnormal CT of the chest 06/04/2018    Priority: High  . Paresthesia 02/12/2019  . Low back pain 02/12/2019  . Neuropathy 12/20/2018  . Lumbar spinal stenosis 06/29/2018  . Preoperative clearance 06/04/2018  . Smoker 05/17/2018  . Hypothyroidism 11/30/2017  . Thoracic aortic atherosclerosis ([place]) 09/14/2016  . Benign essential tremor 09/09/2016  . RLS (restless legs syndrome) 09/09/2016  . H/O adenomatous polyp of colon 07/06/2016  . PVC's (premature ventricular contractions) 03/29/2016  . Preoperative cardiovascular examination 03/29/2016  . Palpitations 01/11/2016  . Shortness of breath 01/11/2016  . Former cigarette smoker 01/11/2016  . Leg edema 01/11/2016  . Chronic cough 10/28/2015  . 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 Malignant neoplasm of upper-inner quadrant of left breast in female, estrogen receptor positive ([place]) 07/14/2015  . Prediabetes 07/08/2015  . CKD (chronic kidney disease), stage III 07/08/2015  . Dependent edema 06/08/2015  . Sleep disturbance 06/08/2015  . Mixed hyperlipidemia 06/08/2015  . Generalized anxiety disorder 06/08/2015  . Essential hypertension 06/08/2015  . Emphysema lung ([place]) 06/08/2015  . Gout 06/08/2015  . Macrocytic anemia   . 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 Sepsis ([place]) 05/15/2015  . Lumbar spondylosis 03/20/2015    Patient's Medications  New Prescriptions   No medications on file  Previous Medications   ALBUTEROL (PROVENTIL HFA;VENTOLIN HFA) 108 (90 BASE) MCG/ACT INHALER    INHALE 2 PUFFS BY MOUTH EVERY 4 TO 6 HOURS AS NEEDED   ALLOPURINOL (ZYLOPRIM) 300 MG TABLET    TAKE 1 TABLET(300 MG) BY MOUTH DAILY   ANASTROZOLE (ARIMIDEX) 1 MG TABLET    TAKE 1 TABLET(1 MG) BY MOUTH DAILY   ATORVASTATIN (LIPITOR) 20 MG TABLET    TAKE 1 TABLET BY MOUTH EVERY DAY   BACLOFEN (LIORESAL) 10 MG TABLET    Take 1 tablet (10 mg)  once daily as needed.   BIOTIN (BIOTIN 5000) 5 MG CAPS    Take 5 mg by mouth daily.    BUSPIRONE (BUSPAR) 15 MG TABLET    Take 1-2 tablets (15-30 mg total) by mouth See admin instructions. Take one tablet in the morning and two tablets in the evening   CALCIUM CARBONATE-VITAMIN D (CALCIUM 500/D PO)    Take 2 each by mouth daily.    COLCHICINE 0.6 MG TABLET    Take 2 tablets now and 1 tablet an hour later for flare up   DICLOFENAC SODIUM (VOLTAREN) 1 % GEL    Apply 4 g topically 4 (four) times daily. As needed for knee pain   DULOXETINE (CYMBALTA) 60 MG CAPSULE    Take 1 capsule (60 mg total) by mouth daily.   FUROSEMIDE (LASIX) 20 MG TABLET    TAKE 1 TABLET BY MOUTH DAILY AS NEEDED   HYDROXYZINE (VISTARIL) 25 MG CAPSULE    Take 25 mg by mouth 2 (two) times daily as needed for anxiety.   LEVOTHYROXINE (SYNTHROID) 75 MCG TABLET    TAKE 1 TABLET(75 MCG) BY MOUTH DAILY BEFORE BREAKFAST   MAGNESIUM 500 MG CAPS    Take 500 mg by mouth daily.   MEGARED OMEGA-3 KRILL OIL PO    Take by mouth.   MELATONIN 3 MG CAPS    Take 9 mg by mouth at bedtime.   METOPROLOL SUCCINATE (TOPROL-XL) 25 MG 24 HR TABLET    TAKE 1/2 TABLET(12.5 MG) BY MOUTH DAILY  OLMESARTAN-HYDROCHLOROTHIAZIDE (BENICAR HCT) 40-12.5 MG TABLET    TAKE 1 TABLET BY MOUTH DAILY   POLYETHYLENE GLYCOL POWDER (MIRALAX) POWDER    Take 17 g by mouth daily as needed for moderate constipation.    PRIMIDONE (MYSOLINE) 50 MG TABLET    TAKE 1 TABLET(50 MG) BY MOUTH AT BEDTIME   ROPINIROLE (REQUIP) 0.5 MG TABLET    Take 1.5 tablets (0.75 mg total) by mouth at bedtime.   VITAMIN B-12 (CYANOCOBALAMIN) 1000 MCG TABLET    Take 1,000 mcg by mouth daily.  Modified Medications   No medications on file  Discontinued Medications   No medications on file    Subjective: Ms. Frerking is in for her routine follow-up visit.  She has multiple medical problems who developed acute respiratory symptoms in September.  She initially thought that these were due to her  seasonal allergies.  She says she then developed a "cold" that turned into bronchitis.  She was treated with ciprofloxacin without much benefit.  She was then treated with a Z-Pak and finally with a sulfa antibiotic.  She was also treated with prednisone.  A chest x-ray on 02/19/2019 did show mild bibasilar infiltrates.  Over the next week she improved and felt like her pneumonia had resolved.  She became acutely confused and was hallucinating leading to admission to the hospital on 03/01/2019.  She was initially hypotensive with confusion, hallucinations and paranoid behavior.  She improved rapidly in the emergency department.  Testing for COVID-19 was negative.  She was afebrile and her chest x-ray was now clear.  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  She was started on empiric antibiotics and continued to improve.  Blood cultures were negative.  She was discharged 3 days later off of antibiotics and prednisone.    She is feeling better with the exception of worsened back pain that will require a third surgery in January.  She has not had any fever and there has been no change in her chronic baseline cough which she believes is due to seasonal allergies.  She says that she occasionally notes some shortness of breath when walking but attributes this to her severe pain.  Review of Systems: Review of Systems  Constitutional: Negative for fever.  Respiratory: Positive for cough and shortness of breath.   Cardiovascular: Negative for chest pain.  Musculoskeletal: Positive for back pain.    Past Medical History:  Diagnosis Date  . AIN (anal intraepithelial neoplasia) anal canal   . Anemia    with pregnancy  . Anxiety   . Arthritis    knees, lower back, knees  . Chronic constipation   . Coarse tremors    Essential  .  COPD (chronic obstructive pulmonary disease) ([place])    2017 chest CT, history of  . Degenerative lumbar spinal stenosis   . GERD (gastroesophageal reflux disease)    pt denies  . Gout   . Grade I diastolic dysfunction 0000000   Noted ECHO  . Hemorrhoids   . History of adenomatous polyp of colon   . History of basal cell carcinoma (BCC) excision 2015   nose  . History of sepsis 05/14/2014   post lumbar surgery  . History of shingles    x2  . Hyperlipidemia   . Hypertension   . Hypothyroidism   . Irregular heart beat  history of while undergoing radiation  . Malignant neoplasm of upper-inner quadrant of left breast in female, estrogen receptor positive [facility]) oncologist-  dr Jana Hakim--  per lov in epic no recurrence   dx 07-13-2015  Left breast invasive ductal carcinoma, Stage IA, Grade 1 (TXN0),  09-16-2015  s/p  left breast lumpectomy with sln bx's,  completed radiation 11-18-2015,  started antiestrogen therapy 12-14-2015  . Neuropathy    Left foot and back of left leg  . Personal history of radiation therapy    completed 11-18-2015  left breast  . Pneumonia   . PONV (postoperative nausea and vomiting)   . Prediabetes    diet controlled, no med  . Restless leg syndrome   . Seasonal allergies   . Seizure ([place]) 05/2015   due to sepsis, only one time episode  . Tremor   . Wears partial dentures    lower     Social History   Tobacco Use  . Smoking status: Former Smoker    Packs/day: 0.50    Years: 35.00    Pack years: 17.50    Types: Cigarettes    Quit date: 05/22/2018    Years since quitting: 0.8  . Smokeless tobacco: Never Used  Substance Use Topics  . Alcohol use: Yes    Comment: 1 glass of wine a night  . Drug use: Never    Comment: CBD tincture    Family History  Problem Relation Age of Onset  . Atrial fibrillation Mother   . Ulcers Father   . Breast cancer Maternal Aunt   . Lung cancer Maternal Uncle   . Stomach cancer Maternal Grandmother   . Lung  cancer Maternal Aunt   . Brain cancer Maternal Aunt   . Prostate cancer Maternal Grandfather   . Stroke Paternal Grandmother   . Tuberculosis Paternal Grandfather   . Colon cancer Neg Hx   . Esophageal cancer Neg Hx   . Rectal cancer Neg Hx     Allergies  Allergen Reactions  . Ampicillin Hives and Other (See Comments)    Severe reaction in February 2017 1.5 month to have hives to go away  . Penicillins Other (See Comments)    Severe reaction in February 2017 1.5 month to have hives to go away Has patient had a PCN reaction causing immediate rash, facial/tongue/throat swelling, SOB or lightheadedness with hypotension: #  #  #  YES  #  #  #  Has patient had a PCN reaction causing severe rash involving mucus membranes or skin necrosis: No Has patient had a PCN reaction that required hospitalization No Has patient had a PCN reaction occurring within the last 10 years: No   . Gabapentin Swelling  . Morphine And Related Itching    Patient states she is not allergic to morphine  . Other Itching    UNSPECIFIED Analgesics  . Codeine Nausea And Vomiting  . Hydrocodone-Acetaminophen Itching  . Hydromorphone Itching    Pt has taken w/o issues    Objective: Vitals:   04/08/19 1455  BP: (!) 150/85  Pulse: (!) 109  Temp: 98.3 F (36.8 C)  TempSrc: Oral  Weight: 255 lb (115.7 kg)   Body mass index is 39.35 kg/m.  Physical Exam Constitutional:      Comments: She is in good spirits.  Cardiovascular:     Rate and Rhythm: Normal rate and regular rhythm.     Heart sounds: No murmur.  Pulmonary:     Effort: Pulmonary effort  is normal.     Breath sounds: Normal breath sounds. No wheezing, rhonchi or rales.     Lab Results Sputum AFB culture 03/04/2019: Negative so far   Problem List Items Addressed This Visit      High   Abnormal CT of the chest    She does not have any respiratory signs or symptoms to suggest active pneumonia.  There is no current indication to do any  further diagnostic testing or treatment.  I will call her if her sputum AFB culture returns positive but otherwise she can follow-up here as needed.          [name], MD [facility] [phone] pager   [phone] cell 04/08/2019, 3:10 PM

## 2019-04-17 DIAGNOSIS — Z85828 Personal history of other malignant neoplasm of skin: Secondary | ICD-10-CM | POA: Diagnosis not present

## 2019-04-17 DIAGNOSIS — L821 Other seborrheic keratosis: Secondary | ICD-10-CM | POA: Diagnosis not present

## 2019-04-17 DIAGNOSIS — L920 Granuloma annulare: Secondary | ICD-10-CM | POA: Diagnosis not present

## 2019-04-20 ENCOUNTER — Other Ambulatory Visit: Payer: PPO

## 2019-04-21 ENCOUNTER — Other Ambulatory Visit: Payer: Self-pay | Admitting: Oncology

## 2019-04-21 ENCOUNTER — Other Ambulatory Visit: Payer: Self-pay | Admitting: Family Medicine

## 2019-04-22 ENCOUNTER — Other Ambulatory Visit: Payer: Self-pay

## 2019-04-22 ENCOUNTER — Ambulatory Visit (INDEPENDENT_AMBULATORY_CARE_PROVIDER_SITE_OTHER): Payer: PPO | Admitting: Family Medicine

## 2019-04-22 ENCOUNTER — Encounter: Payer: Self-pay | Admitting: Family Medicine

## 2019-04-22 VITALS — BP 124/80 | HR 95 | Temp 97.5°F | Resp 16 | Wt 257.6 lb

## 2019-04-22 DIAGNOSIS — R609 Edema, unspecified: Secondary | ICD-10-CM

## 2019-04-22 DIAGNOSIS — N1832 Chronic kidney disease, stage 3b: Secondary | ICD-10-CM | POA: Diagnosis not present

## 2019-04-22 DIAGNOSIS — E875 Hyperkalemia: Secondary | ICD-10-CM

## 2019-04-22 DIAGNOSIS — M545 Low back pain, unspecified: Secondary | ICD-10-CM

## 2019-04-22 NOTE — Progress Notes (Signed)
   Subjective:    Patient ID: Brandi Stephens, female    DOB: 12-20-1945, 74 y.o.   MRN: [id number]  HPI Chief Complaint  Patient presents with  . follow-up on potassium    follow-up on potassium   She is here for a follow up on hyperkalemia and dependent edema. She is taking Lasix daily now and edema has resolved. She has been holding potassium supplement. We will check potassium today.  Denies any new concerns today. States she feels fine except for severe low back pain.  States she is having back surgery again in a couple of weeks.   States she is taking Cymbalta 60 mg for hand pain and this is helping. Dose was increased by Dr. Krista Blue, neurologist.   Denies fever, chills, dizziness, chest pain, palpitations, shortness of breath, abdominal pain, N/V/D, urinary symptoms, LE edema.   Reviewed allergies, medications, past medical, surgical, family, and social history.    Review of Systems Pertinent positives and negatives in the history of present illness.     Objective:   Physical Exam BP 124/80   Pulse 95   Temp (!) 97.5 F (36.4 C)   Resp 16   Wt 257 lb 9.6 oz (116.8 kg)   SpO2 95%   BMI 39.75 kg/m   Alert and oriented and in no acute distress. Extremities without edema. Respirations unlabored. Not otherwise examined.       Assessment & Plan:  Hyperkalemia - Plan: CBC with Differential, Comprehensive metabolic panel  Dependent edema - Plan: CBC with Differential, Comprehensive metabolic panel  Low back pain, unspecified back pain laterality, unspecified chronicity, unspecified whether sciatica present  Stage 3b chronic kidney disease - Plan: CBC with Differential, Comprehensive metabolic panel  Currently taking Lasix daily without a potassium supplement due to hx of hyperkalemia. Check K+ and follow up.  Edema has resolved with Lasix.  Having lumbar surgery soon and we are hopeful this will give her relief.  Follow up on CKD today as well.

## 2019-04-22 NOTE — Telephone Encounter (Signed)
Is this okay to refill? 

## 2019-04-22 NOTE — Telephone Encounter (Signed)
Ok to refill medications

## 2019-04-23 ENCOUNTER — Other Ambulatory Visit: Payer: Self-pay | Admitting: Internal Medicine

## 2019-04-23 DIAGNOSIS — E875 Hyperkalemia: Secondary | ICD-10-CM

## 2019-04-23 LAB — CBC WITH DIFFERENTIAL/PLATELET
Basophils Absolute: 0.1 10*3/uL (ref 0.0–0.2)
Basos: 1 %
EOS (ABSOLUTE): 0.3 10*3/uL (ref 0.0–0.4)
Eos: 3 %
Hematocrit: 36.8 % (ref 34.0–46.6)
Hemoglobin: 12.2 g/dL (ref 11.1–15.9)
Immature Grans (Abs): 0.1 10*3/uL (ref 0.0–0.1)
Immature Granulocytes: 1 %
Lymphocytes Absolute: 1.7 10*3/uL (ref 0.7–3.1)
Lymphs: 20 %
MCH: 32 pg (ref 26.6–33.0)
MCHC: 33.2 g/dL (ref 31.5–35.7)
MCV: 97 fL (ref 79–97)
Monocytes Absolute: 0.5 10*3/uL (ref 0.1–0.9)
Monocytes: 6 %
Neutrophils Absolute: 6.1 10*3/uL (ref 1.4–7.0)
Neutrophils: 69 %
Platelets: 228 10*3/uL (ref 150–450)
RBC: 3.81 x10E6/uL (ref 3.77–5.28)
RDW: 12.8 % (ref 11.7–15.4)
WBC: 8.9 10*3/uL (ref 3.4–10.8)

## 2019-04-23 LAB — COMPREHENSIVE METABOLIC PANEL
ALT: 13 IU/L (ref 0–32)
AST: 13 IU/L (ref 0–40)
Albumin/Globulin Ratio: 1.9 (ref 1.2–2.2)
Albumin: 4.5 g/dL (ref 3.7–4.7)
Alkaline Phosphatase: 95 IU/L (ref 39–117)
BUN/Creatinine Ratio: 25 (ref 12–28)
BUN: 37 mg/dL — ABNORMAL HIGH (ref 8–27)
Bilirubin Total: 0.3 mg/dL (ref 0.0–1.2)
CO2: 23 mmol/L (ref 20–29)
Calcium: 10.7 mg/dL — ABNORMAL HIGH (ref 8.7–10.3)
Chloride: 101 mmol/L (ref 96–106)
Creatinine, Ser: 1.46 mg/dL — ABNORMAL HIGH (ref 0.57–1.00)
GFR calc Af Amer: 41 mL/min/{1.73_m2} — ABNORMAL LOW (ref >59)
GFR calc non Af Amer: 36 mL/min/{1.73_m2} — ABNORMAL LOW (ref >59)
Globulin, Total: 2.4 g/dL (ref 1.5–4.5)
Glucose: 143 mg/dL — ABNORMAL HIGH (ref 65–99)
Potassium: 4.7 mmol/L (ref 3.5–5.2)
Sodium: 139 mmol/L (ref 134–144)
Total Protein: 6.9 g/dL (ref 6.0–8.5)

## 2019-04-23 NOTE — Progress Notes (Signed)
Please see if Brandi Stephens can add a PTH to her blood from yesterday. If not, let's have her return in 2 weeks for a lab visit for elevated calcium and to keep an eye on her potassium which is normal now. She will need a BMP and a PTH at that time if Brandi Stephens cannot add it today. Thanks.

## 2019-04-28 ENCOUNTER — Other Ambulatory Visit: Payer: Self-pay | Admitting: Neurology

## 2019-04-28 DIAGNOSIS — G2581 Restless legs syndrome: Secondary | ICD-10-CM

## 2019-04-29 LAB — ACID FAST CULTURE WITH REFLEXED SENSITIVITIES (MYCOBACTERIA): Acid Fast Culture: NEGATIVE

## 2019-05-01 ENCOUNTER — Other Ambulatory Visit: Payer: Self-pay | Admitting: Neurosurgery

## 2019-05-02 DIAGNOSIS — F4321 Adjustment disorder with depressed mood: Secondary | ICD-10-CM | POA: Diagnosis not present

## 2019-05-03 DIAGNOSIS — H26492 Other secondary cataract, left eye: Secondary | ICD-10-CM | POA: Diagnosis not present

## 2019-05-06 ENCOUNTER — Encounter (HOSPITAL_COMMUNITY): Payer: Self-pay | Admitting: Vascular Surgery

## 2019-05-07 ENCOUNTER — Other Ambulatory Visit: Payer: PPO

## 2019-05-07 ENCOUNTER — Other Ambulatory Visit: Payer: Self-pay

## 2019-05-07 DIAGNOSIS — E875 Hyperkalemia: Secondary | ICD-10-CM | POA: Diagnosis not present

## 2019-05-08 LAB — PTH, INTACT AND CALCIUM: PTH: 69 pg/mL — ABNORMAL HIGH (ref 15–65)

## 2019-05-08 LAB — BASIC METABOLIC PANEL
BUN/Creatinine Ratio: 24 (ref 12–28)
BUN: 35 mg/dL — ABNORMAL HIGH (ref 8–27)
CO2: 20 mmol/L (ref 20–29)
Calcium: 10.3 mg/dL (ref 8.7–10.3)
Chloride: 103 mmol/L (ref 96–106)
Creatinine, Ser: 1.48 mg/dL — ABNORMAL HIGH (ref 0.57–1.00)
GFR calc Af Amer: 40 mL/min/{1.73_m2} — ABNORMAL LOW (ref >59)
GFR calc non Af Amer: 35 mL/min/{1.73_m2} — ABNORMAL LOW (ref >59)
Glucose: 201 mg/dL — ABNORMAL HIGH (ref 65–99)
Potassium: 4.2 mmol/L (ref 3.5–5.2)
Sodium: 145 mmol/L — ABNORMAL HIGH (ref 134–144)

## 2019-05-09 ENCOUNTER — Encounter: Payer: Self-pay | Admitting: Family Medicine

## 2019-05-09 DIAGNOSIS — R7989 Other specified abnormal findings of blood chemistry: Secondary | ICD-10-CM

## 2019-05-09 HISTORY — DX: Other specified abnormal findings of blood chemistry: R79.89

## 2019-05-09 NOTE — Progress Notes (Signed)
Please put in referral to endocrinology for elevated PTH and hypercalcemia. I made her aware.

## 2019-05-10 NOTE — Progress Notes (Signed)
[facility] [id number], [address] [place] [address] Phone: [phone] Fax: [phone]      Your procedure is scheduled on Thursday, May 16, 2019.  Report to [facility] Main Entrance "A" at 10:30 A.M., and check in at the Admitting office.  Call this number if you have problems the morning of surgery:  [phone]  Call [phone] if you have any questions prior to your surgery date Monday-Friday 8am-4pm    Remember:  Do not eat or drink after midnight the night before your surgery    Take these medicines the morning of surgery with A SIP OF WATER: albuterol (PROVENTIL HFA;VENTOLIN HFA) - if needed allopurinol (ZYLOPRIM) anastrozole (ARIMIDEX) atorvastatin (LIPITOR) Colchicine - if needed diphenhydrAMINE (BENADRYL) - if needed DULoxetine (CYMBALTA) hydrOXYzine (VISTARIL) - if needed levothyroxine (SYNTHROID) metoprolol succinate (TOPROL-XL) polyethylene glycol powder (MIRALAX) - if needed methocarbamol (ROBAXIN)  Please bring all inhalers with you the day of surgery.    7 days prior to surgery STOP taking any Aspirin (unless otherwise instructed by your surgeon), Aleve, Naproxen, Ibuprofen, Motrin, Advil, Goody's, BC's, all herbal medications, fish oil, and all vitamins.    The Morning of Surgery  Do not wear jewelry, make-up or nail polish.  Do not wear lotions, powders, perfumes, or deodorant  Do not shave 48 hours prior to surgery.  Do not bring valuables to the hospital.  [facility] is not responsible for any belongings or valuables.  If you are a smoker, DO NOT Smoke 24 hours prior to surgery  If you wear a CPAP at night please bring your mask the morning of surgery   Remember that you must have someone to transport you home after your surgery, and remain with you for 24 hours if you are discharged the same day.   Please bring cases for contacts,  glasses, hearing aids, dentures or bridgework because it cannot be worn into surgery.    Leave your suitcase in the car.  After surgery it may be brought to your room.  For patients admitted to the hospital, discharge time will be determined by your treatment team.  Patients discharged the day of surgery will not be allowed to drive home.    Special instructions:   [facility]- Preparing For Surgery  Before surgery, you can play an important role. Because skin is not sterile, your skin needs to be as free of germs as possible. You can reduce the number of germs on your skin by washing with CHG (chlorahexidine gluconate) Soap before surgery.  CHG is an antiseptic cleaner which kills germs and bonds with the skin to continue killing germs even after washing.    Oral Hygiene is also important to reduce your risk of infection.  Remember - BRUSH YOUR TEETH THE MORNING OF SURGERY WITH YOUR REGULAR TOOTHPASTE  Please do not use if you have an allergy to CHG or antibacterial soaps. If your skin becomes reddened/irritated stop using the CHG.  Do not shave (including legs and underarms) for at least 48 hours prior to first CHG shower. It is OK to shave your face.  Please follow these instructions carefully.   1. Shower the NIGHT BEFORE SURGERY and the MORNING OF SURGERY with CHG Soap.   2. If you chose to wash your hair, wash your hair first as usual with your normal shampoo.  3. After you shampoo, rinse your hair and body  thoroughly to remove the shampoo.  4. Use CHG as you would any other liquid soap. You can apply CHG directly to the skin and wash gently with a scrungie or a clean washcloth.   5. Apply the CHG Soap to your body ONLY FROM THE NECK DOWN.  Do not use on open wounds or open sores. Avoid contact with your eyes, ears, mouth and genitals (private parts). Wash Face and genitals (private parts)  with your normal soap.   6. Wash thoroughly, paying special attention to the area where  your surgery will be performed.  7. Thoroughly rinse your body with warm water from the neck down.  8. DO NOT shower/wash with your normal soap after using and rinsing off the CHG Soap.  9. Pat yourself dry with a CLEAN TOWEL.  10. Wear CLEAN PAJAMAS to bed the night before surgery, wear comfortable clothes the morning of surgery  11. Place CLEAN SHEETS on your bed the night of your first shower and DO NOT SLEEP WITH PETS.    Day of Surgery:  Please shower the morning of surgery with the CHG soap Do not apply any deodorants/lotions. Please wear clean clothes to the hospital/surgery center.   Remember to brush your teeth WITH YOUR REGULAR TOOTHPASTE.   Please read over the following fact sheets that you were given.

## 2019-05-13 ENCOUNTER — Inpatient Hospital Stay (HOSPITAL_COMMUNITY): Admission: RE | Admit: 2019-05-13 | Payer: PPO | Source: Ambulatory Visit

## 2019-05-13 ENCOUNTER — Inpatient Hospital Stay (HOSPITAL_COMMUNITY)
Admission: RE | Admit: 2019-05-13 | Discharge: 2019-05-13 | Disposition: A | Discharge status: Home / Self Care | Payer: PPO | Source: Ambulatory Visit

## 2019-05-16 ENCOUNTER — Encounter (HOSPITAL_COMMUNITY): Admission: RE | Payer: Self-pay | Source: Home / Self Care

## 2019-05-16 ENCOUNTER — Inpatient Hospital Stay (HOSPITAL_COMMUNITY): Admission: RE | Admit: 2019-05-16 | Payer: PPO | Source: Home / Self Care | Admitting: Neurosurgery

## 2019-05-16 SURGERY — POSTERIOR LUMBAR FUSION 1 WITH HARDWARE REMOVAL
Anesthesia: General

## 2019-05-20 ENCOUNTER — Ambulatory Visit: Payer: Self-pay | Admitting: Neurology

## 2019-05-21 ENCOUNTER — Other Ambulatory Visit: Payer: Self-pay | Admitting: Neurosurgery

## 2019-05-24 NOTE — Progress Notes (Signed)
[facility] [id number], [place] [address] Phone: [phone] Fax: [phone]      Your procedure is scheduled on Thursday, February 11th, 2021.  Report to [facility] Main Entrance "A" at 10:30 A.M., and check in at the Admitting office.  Call this number if you have problems the morning of surgery:  [phone]  Call [phone] if you have any questions prior to your surgery date Monday-Friday 8am-4pm    Remember:  Do not eat or drink after midnight the night before your surgery    Take Only these medicines the morning of surgery with A SIP OF WATER :  Allopurinol (Zyloprim) Anastrozole (Arimidex) Atorvastatin (Lipitor) Buspirone (Buspar) Duloxetine (Cymbalta) Levothyroxine (Synthroid) Metoprolol Succinate (Toprol-XL)  If needed, you may also take the following: Albuterol (Proventil/Ventolin) inhaler - please bring with you the day of surgery Diphenhydramine (Benadryl) Hydrocodone-Acetaminophen (Norco/Vicodin) Hydroxyzine (Vistaril) Methocarbamol (Robaxin)  From this point forward, prior to surgery, STOP taking any Aspirin (unless otherwise instructed by your surgeon), Aleve, Naproxen, Ibuprofen, Motrin, Advil, Goody's, BC's, all herbal medications, fish oil, and all vitamins.    The Morning of Surgery  Do not wear jewelry, make-up or nail polish.  Do not wear lotions, powders, or perfumes/colognes, or deodorant  Do not shave 48 hours prior to surgery.  Do not bring valuables to the hospital.  [facility] is not responsible for any belongings or valuables.  If you are a smoker, DO NOT Smoke 24 hours prior to surgery  If you wear a CPAP at night please bring your mask the morning of surgery   Remember that you must have someone to transport you home after your surgery, and remain with you for 24 hours if you are discharged the same day.   Please  bring cases for contacts, glasses, hearing aids, dentures or bridgework because it cannot be worn into surgery.    Leave your suitcase in the car.  After surgery it may be brought to your room.  For patients admitted to the hospital, discharge time will be determined by your treatment team.  Patients discharged the day of surgery will not be allowed to drive home.    Special instructions:   [place]- Preparing For Surgery  Before surgery, you can play an important role. Because skin is not sterile, your skin needs to be as free of germs as possible. You can reduce the number of germs on your skin by washing with CHG (chlorahexidine gluconate) Soap before surgery.  CHG is an antiseptic cleaner which kills germs and bonds with the skin to continue killing germs even after washing.    Oral Hygiene is also important to reduce your risk of infection.  Remember - BRUSH YOUR TEETH THE MORNING OF SURGERY WITH YOUR REGULAR TOOTHPASTE  Please do not use if you have an allergy to CHG or antibacterial soaps. If your skin becomes reddened/irritated stop using the CHG.  Do not shave (including legs and underarms) for at least 48 hours prior to first CHG shower. It is OK to shave your face.  Please follow these instructions carefully.   1. Shower the NIGHT BEFORE SURGERY and the MORNING OF SURGERY with CHG Soap.   2. If you chose to wash your hair, wash your hair first as usual with your normal shampoo.  3. After you shampoo, rinse your hair and body thoroughly to remove the shampoo.  4. Use CHG as you would any other liquid soap. You can apply CHG directly to the skin and wash gently with a scrungie or a clean washcloth.   5. Apply the CHG Soap to your body ONLY FROM THE NECK DOWN.  Do not use on open wounds or open sores. Avoid contact with your eyes, ears, mouth and genitals (private parts). Wash Face and genitals (private parts)  with your normal soap.   6. Wash thoroughly, paying special  attention to the area where your surgery will be performed.  7. Thoroughly rinse your body with warm water from the neck down.  8. DO NOT shower/wash with your normal soap after using and rinsing off the CHG Soap.  9. Pat yourself dry with a CLEAN TOWEL.  10. Wear CLEAN PAJAMAS to bed the night before surgery, wear comfortable clothes the morning of surgery  11. Place CLEAN SHEETS on your bed the night of your first shower and DO NOT SLEEP WITH PETS.    Day of Surgery:  Please shower the morning of surgery with the CHG soap Do not apply any deodorants/lotions. Please wear clean clothes to the hospital/surgery center.   Remember to brush your teeth WITH YOUR REGULAR TOOTHPASTE.   Please read over the following fact sheets that you were given.

## 2019-05-27 ENCOUNTER — Encounter (HOSPITAL_COMMUNITY): Payer: Self-pay

## 2019-05-27 ENCOUNTER — Encounter (HOSPITAL_COMMUNITY)
Admission: RE | Admit: 2019-05-27 | Discharge: 2019-05-27 | Disposition: A | Discharge status: Home / Self Care | Payer: PPO | Source: Ambulatory Visit | Attending: Neurosurgery | Admitting: Neurosurgery

## 2019-05-27 ENCOUNTER — Other Ambulatory Visit (HOSPITAL_COMMUNITY): Payer: PPO

## 2019-05-27 ENCOUNTER — Other Ambulatory Visit (HOSPITAL_COMMUNITY)
Admission: RE | Admit: 2019-05-27 | Discharge: 2019-05-27 | Disposition: A | Discharge status: Home / Self Care | Payer: PPO | Source: Ambulatory Visit | Attending: Neurosurgery | Admitting: Neurosurgery

## 2019-05-27 ENCOUNTER — Other Ambulatory Visit: Payer: Self-pay

## 2019-05-27 DIAGNOSIS — Z87891 Personal history of nicotine dependence: Secondary | ICD-10-CM | POA: Diagnosis not present

## 2019-05-27 DIAGNOSIS — E039 Hypothyroidism, unspecified: Secondary | ICD-10-CM | POA: Diagnosis present

## 2019-05-27 DIAGNOSIS — C50212 Malignant neoplasm of upper-inner quadrant of left female breast: Secondary | ICD-10-CM | POA: Diagnosis present

## 2019-05-27 DIAGNOSIS — J439 Emphysema, unspecified: Secondary | ICD-10-CM | POA: Diagnosis present

## 2019-05-27 DIAGNOSIS — Z923 Personal history of irradiation: Secondary | ICD-10-CM | POA: Diagnosis not present

## 2019-05-27 DIAGNOSIS — D62 Acute posthemorrhagic anemia: Secondary | ICD-10-CM | POA: Diagnosis not present

## 2019-05-27 DIAGNOSIS — M109 Gout, unspecified: Secondary | ICD-10-CM | POA: Diagnosis present

## 2019-05-27 DIAGNOSIS — Z880 Allergy status to penicillin: Secondary | ICD-10-CM | POA: Diagnosis not present

## 2019-05-27 DIAGNOSIS — E782 Mixed hyperlipidemia: Secondary | ICD-10-CM | POA: Diagnosis present

## 2019-05-27 DIAGNOSIS — Z888 Allergy status to other drugs, medicaments and biological substances status: Secondary | ICD-10-CM | POA: Diagnosis not present

## 2019-05-27 DIAGNOSIS — Z885 Allergy status to narcotic agent status: Secondary | ICD-10-CM | POA: Diagnosis not present

## 2019-05-27 DIAGNOSIS — M48061 Spinal stenosis, lumbar region without neurogenic claudication: Secondary | ICD-10-CM | POA: Diagnosis present

## 2019-05-27 DIAGNOSIS — M4316 Spondylolisthesis, lumbar region: Secondary | ICD-10-CM | POA: Diagnosis not present

## 2019-05-27 DIAGNOSIS — Z20822 Contact with and (suspected) exposure to covid-19: Secondary | ICD-10-CM | POA: Diagnosis present

## 2019-05-27 DIAGNOSIS — Z8619 Personal history of other infectious and parasitic diseases: Secondary | ICD-10-CM | POA: Insufficient documentation

## 2019-05-27 DIAGNOSIS — M4326 Fusion of spine, lumbar region: Secondary | ICD-10-CM | POA: Diagnosis not present

## 2019-05-27 DIAGNOSIS — Z79899 Other long term (current) drug therapy: Secondary | ICD-10-CM | POA: Diagnosis not present

## 2019-05-27 DIAGNOSIS — N183 Chronic kidney disease, stage 3 unspecified: Secondary | ICD-10-CM | POA: Diagnosis present

## 2019-05-27 DIAGNOSIS — Z981 Arthrodesis status: Secondary | ICD-10-CM | POA: Diagnosis not present

## 2019-05-27 DIAGNOSIS — F411 Generalized anxiety disorder: Secondary | ICD-10-CM | POA: Diagnosis present

## 2019-05-27 DIAGNOSIS — M47896 Other spondylosis, lumbar region: Secondary | ICD-10-CM | POA: Diagnosis not present

## 2019-05-27 DIAGNOSIS — Z79811 Long term (current) use of aromatase inhibitors: Secondary | ICD-10-CM | POA: Diagnosis not present

## 2019-05-27 DIAGNOSIS — I129 Hypertensive chronic kidney disease with stage 1 through stage 4 chronic kidney disease, or unspecified chronic kidney disease: Secondary | ICD-10-CM | POA: Diagnosis present

## 2019-05-27 DIAGNOSIS — Z85828 Personal history of other malignant neoplasm of skin: Secondary | ICD-10-CM | POA: Diagnosis not present

## 2019-05-27 DIAGNOSIS — Z79890 Hormone replacement therapy (postmenopausal): Secondary | ICD-10-CM | POA: Diagnosis not present

## 2019-05-27 DIAGNOSIS — K5909 Other constipation: Secondary | ICD-10-CM | POA: Diagnosis present

## 2019-05-27 DIAGNOSIS — G2581 Restless legs syndrome: Secondary | ICD-10-CM | POA: Diagnosis present

## 2019-05-27 DIAGNOSIS — Z01812 Encounter for preprocedural laboratory examination: Secondary | ICD-10-CM | POA: Insufficient documentation

## 2019-05-27 DIAGNOSIS — M47816 Spondylosis without myelopathy or radiculopathy, lumbar region: Secondary | ICD-10-CM | POA: Diagnosis present

## 2019-05-27 HISTORY — DX: Other psychoactive substance dependence, uncomplicated: F19.20

## 2019-05-27 LAB — BASIC METABOLIC PANEL
Anion gap: 11 (ref 5–15)
BUN: 43 mg/dL — ABNORMAL HIGH (ref 8–23)
CO2: 25 mmol/L (ref 22–32)
Calcium: 9.7 mg/dL (ref 8.9–10.3)
Chloride: 102 mmol/L (ref 98–111)
Creatinine, Ser: 1.78 mg/dL — ABNORMAL HIGH (ref 0.44–1.00)
GFR calc Af Amer: 32 mL/min — ABNORMAL LOW (ref >60)
GFR calc non Af Amer: 28 mL/min — ABNORMAL LOW (ref >60)
Glucose, Bld: 153 mg/dL — ABNORMAL HIGH (ref 70–99)
Potassium: 4.9 mmol/L (ref 3.5–5.1)
Sodium: 138 mmol/L (ref 135–145)

## 2019-05-27 LAB — SARS CORONAVIRUS 2 (TAT 6-24 HRS): SARS Coronavirus 2: NEGATIVE

## 2019-05-27 LAB — TYPE AND SCREEN
ABO/RH(D): A POS
Antibody Screen: NEGATIVE

## 2019-05-27 LAB — CBC
HCT: 38.3 % (ref 36.0–46.0)
Hemoglobin: 12.5 g/dL (ref 12.0–15.0)
MCH: 32.6 pg (ref 26.0–34.0)
MCHC: 32.6 g/dL (ref 30.0–36.0)
MCV: 99.7 fL (ref 80.0–100.0)
Platelets: 271 10*3/uL (ref 150–400)
RBC: 3.84 MIL/uL — ABNORMAL LOW (ref 3.87–5.11)
RDW: 13.5 % (ref 11.5–15.5)
WBC: 9 10*3/uL (ref 4.0–10.5)
nRBC: 0 % (ref 0.0–0.2)

## 2019-05-27 LAB — SURGICAL PCR SCREEN
MRSA, PCR: NEGATIVE
Staphylococcus aureus: NEGATIVE

## 2019-05-27 NOTE — Progress Notes (Signed)
PCP - Glo Herring      Clearance,medical Cardiologist - na      Chest x-ray - 11/20 EKG - 11/20 Stress Test - 10/17 ECHO - 10/17 Cardiac Cath - na Sleep Study -na  CPAP -na     Aspirin Instructions:stop     COVID TEST- for today   Anesthesia review: reviiew hx  .Recently getting over sepsis  Patient denies shortness of breath, fever, cough and chest pain at PAT appointment   All instructions explained to the patient, with a verbal understanding of the material. Patient agrees to go over the instructions while at home for a better understanding. Patient also instructed to self quarantine after being tested for COVID-19. The opportunity to ask questions was provided.

## 2019-05-28 NOTE — Anesthesia Preprocedure Evaluation (Addendum)
Anesthesia Evaluation  Patient identified by MRN, date of birth, ID band Patient awake    Reviewed: Allergy & Precautions, NPO status , Patient's Chart, lab work & pertinent test results  Airway Mallampati: II  TM Distance: >3 FB Neck ROM: Full    Dental  (+) Teeth Intact, Dental Advisory Given   Pulmonary Patient abstained from smoking., former smoker,    breath sounds clear to auscultation       Cardiovascular hypertension,  Rhythm:Regular Rate:Normal     Neuro/Psych    GI/Hepatic   Endo/Other    Renal/GU      Musculoskeletal   Abdominal (+) + obese,   Peds  Hematology   Anesthesia Other Findings   Reproductive/Obstetrics                            Anesthesia Physical Anesthesia Plan  ASA: III  Anesthesia Plan: General   Post-op Pain Management:    Induction: Intravenous  PONV Risk Score and Plan: Ondansetron and Dexamethasone  Airway Management Planned: Oral ETT  Additional Equipment:   Intra-op Plan:   Post-operative Plan: Extubation in OR  Informed Consent: I have reviewed the patients History and Physical, chart, labs and discussed the procedure including the risks, benefits and alternatives for the proposed anesthesia with the patient or authorized representative who has indicated his/her understanding and acceptance.     Dental advisory given  Plan Discussed with:   Anesthesia Plan Comments: (Hospitalized Nov 2020 for sepsis secondary to prolonged PNA. Since discharge she has had followup with ID. Per last visit with Dr. Megan Salon 04/07/09, "She does not have any respiratory signs or symptoms to suggest active pneumonia.  There is no current indication to do any further diagnostic testing or treatment.  I will call her if her sputum AFB culture returns positive but otherwise she can follow-up here as needed."  Last seen by PCP 04/22/19, doing well other than LBP,  discussed upcoming surgery.   CKD III followed by PCP. Review of labs shows baseline creatinine over the past year ~1.3-1.50. Creatinine on preop labs 1.78.   Remainder of preop labs unremarkable.   EKG 03/01/19: Sinus rhythm. Rate 92. Borderline intraventricular conduction delay. Nonspecific T abnrm, anterolateral leads  PFTs 06/04/18: FVC 2.97 (88%), post 2.96 (88%) FEV1 2.08 (82%), post 2.16 (85%) Post FEV1/FVC ration 73% DLCO unc 15.95 (74%) No significant BD response. Mild flow reversibility.   Echo 01/22/16: Study Conclusions - Left ventricle: The cavity size was normal. Wall thickness was increased in a pattern of mild LVH. Systolic function was vigorous. The estimated ejection fraction was in the range of 65% to 70%. Doppler parameters are consistent with abnormal left ventricular relaxation (grade 1 diastolic dysfunction).  Nuclear stress test 01/22/16: The left ventricular ejection fraction is normal (55-65%). Nuclear stress EF: 64%. There was no ST segment deviation noted during stress. The study is normal. This is a low risk study. Normal resting and stress perfusion. No ischemia or infarction EF 64%  48 Hour Holter Monitor 01/18/16: Study Highlights: Sinus rhythm and sinus tachy. Infrequent PVC's.  )      Anesthesia Quick Evaluation

## 2019-05-28 NOTE — Progress Notes (Addendum)
Anesthesia Chart Review:  Hospitalized Nov 2020 for sepsis secondary to prolonged PNA. Since discharge she has had followup with ID. Per last visit with Dr. Megan Salon 04/07/09, "She does not have any respiratory signs or symptoms to suggest active pneumonia.  There is no current indication to do any further diagnostic testing or treatment.  I will call her if her sputum AFB culture returns positive but otherwise she can follow-up here as needed."  Last seen by PCP 04/22/19, doing well other than LBP, discussed upcoming surgery.   CKD III followed by PCP. Review of labs shows baseline creatinine over the past year ~1.3-1.50. Creatinine on preop labs 1.78.   Remainder of preop labs unremarkable.   EKG 03/01/19: Sinus rhythm. Rate 92. Borderline intraventricular conduction delay. Nonspecific T abnrm, anterolateral leads  PFTs 06/04/18: FVC 2.97 (88%), post 2.96 (88%) FEV1 2.08 (82%), post 2.16 (85%) Post FEV1/FVC ration 73% DLCO unc 15.95 (74%) No significant BD response. Mild flow reversibility.   Echo 01/22/16: Study Conclusions - Left ventricle: The cavity size was normal. Wall thickness was increased in a pattern of mild LVH. Systolic function was vigorous. The estimated ejection fraction was in the range of 65% to 70%. Doppler parameters are consistent with abnormal left ventricular relaxation (grade 1 diastolic dysfunction).  Nuclear stress test 01/22/16:  The left ventricular ejection fraction is normal (55-65%).  Nuclear stress EF: 64%.  There was no ST segment deviation noted during stress.  The study is normal.  This is a low risk study. Normal resting and stress perfusion. No ischemia or infarction EF 64%  48 Hour Holter Monitor 01/18/16: Study Highlights: Sinus rhythm and sinus tachy. Infrequent PVC's.   Wynonia Musty [facility] Short Stay Center/Anesthesiology Phone [phone] 05/28/2019 2:10 PM

## 2019-05-29 NOTE — H&P (Signed)
Chief Complaint   Back pain  HPI   HPI: Brandi Stephens is a 74 y.o. female with history of multiple lumbar decompression and fusions (L4-5, L5-1 2016, L3-4 with extension of prior fusion 2020) who developed a fairly severe midline lower back pain after being hospitalized for sepsis secondary to pneumonia several months ago.  A repeat MRI of her lumbar spine was ordered revealing severe adjacent level disease at L2-3. She presents today for decompression fusion at L2-3 with extension of prior fusion.  She is without any concerns.  Patient Active Problem List   Diagnosis Date Noted   Elevated PTHrP level 05/09/2019   Paresthesia 02/12/2019   Low back pain 02/12/2019   Neuropathy 12/20/2018   Lumbar spinal stenosis 06/29/2018   Preoperative clearance 06/04/2018   Abnormal CT of the chest 06/04/2018   Smoker 05/17/2018   Hypothyroidism 11/30/2017   Thoracic aortic atherosclerosis ([place]) 09/14/2016   Benign essential tremor 09/09/2016   RLS (restless legs syndrome) 09/09/2016   H/O adenomatous polyp of colon 07/06/2016   PVC's (premature ventricular contractions) 03/29/2016   Preoperative cardiovascular examination 03/29/2016   Palpitations 01/11/2016   Shortness of breath 01/11/2016   Former cigarette smoker 01/11/2016   Leg edema 01/11/2016   Chronic cough 10/28/2015   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   Malignant neoplasm of upper-inner quadrant of left breast in female, estrogen receptor positive ([place]) 07/14/2015   Prediabetes 07/08/2015   CKD (chronic kidney disease), stage III 07/08/2015   Dependent edema 06/08/2015   Sleep disturbance 06/08/2015   Mixed hyperlipidemia 06/08/2015   Generalized anxiety disorder 06/08/2015   Essential hypertension 06/08/2015   Emphysema lung ([place]) 06/08/2015   Gout 06/08/2015   Macrocytic anemia    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[place])    Sepsis ([place]) 05/15/2015   Lumbar spondylosis 03/20/2015    PMH: Past Medical History:    Diagnosis Date   AIN (anal intraepithelial neoplasia) anal canal    Anemia    with pregnancy   Anxiety    Arthritis    knees, lower back, knees   Chronic constipation    Coarse tremors    Essential   COPD (chronic obstructive pulmonary disease) ([place])    2017 chest CT, history of   Degenerative lumbar spinal stenosis    Depression    Drug dependency ([place])    Elevated PTHrP level 05/09/2019   GERD (gastroesophageal reflux disease)    pt denies   Gout    Grade I diastolic dysfunction 0000000   Noted ECHO   Hemorrhoids    History of adenomatous polyp of colon    History of basal cell carcinoma (BCC) excision 2015   nose   History of sepsis 05/14/2014   post lumbar surgery   History of shingles    x2   Hyperlipidemia    Hypertension    Hypothyroidism    Irregular heart beat    history of while undergoing radiation   Malignant neoplasm of upper-inner quadrant of left breast in female, estrogen receptor positive [facility]) oncologist-  dr Jana Hakim--  per lov in epic no recurrence   dx 07-13-2015  Left breast invasive ductal carcinoma, Stage IA, Grade 1 (TXN0),  09-16-2015  s/p  left breast lumpectomy with sln bx's,  completed radiation 11-18-2015,  started antiestrogen therapy 12-14-2015   Neuropathy    Left foot and back of left leg   Personal history of radiation therapy    completed 11-18-2015  left breast   Pneumonia    PONV (  postoperative nausea and vomiting)    Prediabetes    diet controlled, no med   Restless leg syndrome    Seasonal allergies    Seizure ([place]) 05/2015   due to sepsis, only one time episode   Tremor    Wears partial dentures    lower     PSH: Past Surgical History:  Procedure Laterality Date   ABDOMINAL HYSTERECTOMY  1985   BASAL CELL CARCINOMA EXCISION  10/15   nose   BREAST BIOPSY Right 08/24/2015   BREAST BIOPSY Right 07/13/2015   BREAST BIOPSY Left 07/13/2015   BREAST EXCISIONAL BIOPSY Left 1972   BREAST LUMPECTOMY Left    BREAST  LUMPECTOMY WITH RADIOACTIVE SEED AND SENTINEL LYMPH NODE BIOPSY Left 09/16/2015   Procedure: BREAST LUMPECTOMY WITH RADIOACTIVE SEED AND SENTINEL LYMPH NODE BIOPSY;  Surgeon: Stark Klein, MD;  Location: [place];  Service: General;  Laterality: Left;   BREAST REDUCTION SURGERY Bilateral 1998   CATARACT EXTRACTION W/ INTRAOCULAR LENS IMPLANT Left 2016   [place]   hallo   COLONOSCOPY  01/2013   polyps   EYE SURGERY Left 2016   HAND SURGERY Left 02-19-2002   dr Fredna Dow  @[facility]    repair collateral ligament/ MPJ left thumb   HEMORRHOID SURGERY  2008   HIGH RESOLUTION ANOSCOPY N/A 02/28/2018   Procedure: HIGH RESOLUTION ANOSCOPY WITH BIOPSY;  Surgeon: Leighton Ruff, MD;  Location: [place];  Service: General;  Laterality: N/A;   KNEE ARTHROSCOPY Right 2002   LUMBAR SPINE SURGERY  03-20-2015   dr Kathyrn Sheriff   fusion L4-5, L5-S1   MOUTH SURGERY  07/14/2018   2 infected teeth removed   MULTIPLE TOOTH EXTRACTIONS     with bone grafting lower bottom right and left   REDUCTION MAMMAPLASTY Bilateral    RIGHT COLECTOMY  09-10-2003   dr Margot Chimes @[facility]    multiple colon polyps (per path tubular adenoma's, hyperplastic , benign appendix, benign two lymph nodes   ROTATOR CUFF REPAIR Left 04/2016   TONSILLECTOMY  1969   TUBAL LIGATION Bilateral 1982    No medications prior to admission.    SH: Social History   Tobacco Use   Smoking status: Former Smoker    Packs/day: 0.50    Years: 35.00    Pack years: 17.50    Types: Cigarettes    Quit date: 05/22/2018    Years since quitting: 1.0   Smokeless tobacco: Never Used  Substance Use Topics   Alcohol use: Yes    Comment: 1 glass of wine a night   Drug use: Never    Comment: CBD tincture    MEDS: Prior to Admission medications   Medication Sig Start Date End Date Taking? Authorizing Provider  albuterol (PROVENTIL HFA;VENTOLIN HFA) 108 (90 Base) MCG/ACT inhaler INHALE 2 PUFFS BY MOUTH EVERY 4  TO 6 HOURS AS NEEDED Patient taking differently: Inhale 2 puffs into the lungs every 4 (four) hours as needed for wheezing or shortness of breath.  05/03/18   Henson, Vickie L, NP-C  allopurinol (ZYLOPRIM) 100 MG tablet TAKE 2 TABLETS(200 MG) BY MOUTH DAILY Patient not taking: Reported on 05/07/2019 04/22/19   Harland Dingwall L, NP-C  allopurinol (ZYLOPRIM) 300 MG tablet TAKE 1 TABLET(300 MG) BY MOUTH DAILY Patient taking differently: Take 300 mg by mouth daily.  03/27/19   Henson, Vickie L, NP-C  anastrozole (ARIMIDEX) 1 MG tablet TAKE 1 TABLET(1 MG) BY MOUTH DAILY Patient taking differently: Take 1 mg  by mouth daily.  04/21/19   Magrinat, Virgie Dad, MD  atorvastatin (LIPITOR) 20 MG tablet TAKE 1 TABLET BY MOUTH EVERY DAY Patient taking differently: Take 20 mg by mouth daily.  04/22/19   Henson, Vickie L, NP-C  baclofen (LIORESAL) 10 MG tablet Take 1 tablet (10 mg) once daily as needed. Patient not taking: Reported on 04/22/2019 08/16/18   Harland Dingwall L, NP-C  Biotin (BIOTIN 5000) 5 MG CAPS Take 5 mg by mouth daily.     [provider]  busPIRone (BUSPAR) 15 MG tablet Take 1-2 tablets (15-30 mg total) by mouth See admin instructions. Take one tablet in the morning and two tablets in the evening Patient not taking: Reported on 05/07/2019 03/18/19   Harland Dingwall L, NP-C  Calcium Carb-Cholecalciferol (CALCIUM 1000 + D PO) Take 1 tablet by mouth daily.    [provider]  colchicine 0.6 MG tablet Take 2 tablets now and 1 tablet an hour later for flare up Patient taking differently: Take 0.6-1.2 mg by mouth daily as needed (gout).  12/20/18   Henson, Vickie L, NP-C  diclofenac Sodium (VOLTAREN) 1 % GEL Apply 4 g topically 4 (four) times daily. As needed for knee pain 03/18/19   Henson, Vickie L, NP-C  diphenhydrAMINE (BENADRYL) 25 MG tablet Take 25 mg by mouth daily as needed for itching.    [provider]  DULoxetine (CYMBALTA) 60 MG capsule Take 1 capsule (60 mg total) by mouth  daily. 03/18/19   Henson, Vickie L, NP-C  furosemide (LASIX) 20 MG tablet TAKE 1 TABLET BY MOUTH DAILY AS NEEDED Patient taking differently: Take 20 mg by mouth daily.  01/23/19   Henson, Vickie L, NP-C  HYDROcodone-acetaminophen (NORCO/VICODIN) 5-325 MG tablet Take 1 tablet by mouth at bedtime.  04/18/19   [provider]  hydrOXYzine (VISTARIL) 25 MG capsule Take 25 mg by mouth 2 (two) times daily as needed for anxiety.    [provider]  Javier Docker Oil 350 MG CAPS Take 700 mg by mouth daily.    [provider]  levothyroxine (SYNTHROID) 75 MCG tablet Take 1 tablet (75 mcg total) by mouth daily before breakfast. 04/22/19   Henson, Vickie L, NP-C  Magnesium 500 MG CAPS Take 500 mg by mouth daily.    [provider]  Melatonin 5 MG/15ML LIQD Take 10 mg by mouth at bedtime.     [provider]  methocarbamol (ROBAXIN) 750 MG tablet Take 750 mg by mouth 3 (three) times daily.    [provider]  metoprolol succinate (TOPROL-XL) 25 MG 24 hr tablet TAKE 1/2 TABLET(12.5 MG) BY MOUTH DAILY Patient taking differently: Take 12.5 mg by mouth daily. TAKE 1/2 TABLET(12.5 MG) BY MOUTH DAILY 03/18/19   Henson, Vickie L, NP-C  olmesartan-hydrochlorothiazide (BENICAR HCT) 40-12.5 MG tablet TAKE 1 TABLET BY MOUTH DAILY 04/22/19   Henson, Vickie L, NP-C  polyethylene glycol powder (MIRALAX) powder Take 17 g by mouth daily as needed for moderate constipation.     [provider]  primidone (MYSOLINE) 50 MG tablet TAKE 1 TABLET(50 MG) BY MOUTH AT BEDTIME Patient taking differently: Take 50 mg by mouth at bedtime.  04/22/19   Henson, Vickie L, NP-C  rOPINIRole (REQUIP) 0.5 MG tablet Take 1.5 tablets (0.75 mg total) by mouth at bedtime. 04/20/18   Narda Amber K, DO  vitamin B-12 (CYANOCOBALAMIN) 1000 MCG tablet Take 1,000 mcg by mouth daily.    [provider]    ALLERGY: Allergies  Allergen  Reactions   Ampicillin Hives and Other (See Comments)     Severe reaction in February 2017 1.5 month to have hives to go away   Penicillins Hives and Other (See Comments)    Severe reaction in February 2017 1.5 month to have hives to go away Has patient had a PCN reaction causing immediate rash, facial/tongue/throat swelling, SOB or lightheadedness with hypotension: #  #  #  YES  #  #  #  Has patient had a PCN reaction causing severe rash involving mucus membranes or skin necrosis: No Has patient had a PCN reaction that required hospitalization No Has patient had a PCN reaction occurring within the last 10 years: No    Gabapentin Swelling    UNSPECIFIED REACTION    Other Itching    UNSPECIFIED Analgesics   Codeine Nausea And Vomiting   Hydrocodone-Acetaminophen Itching   Hydromorphone Itching    Pt has taken w/o issues    Social History   Tobacco Use   Smoking status: Former Smoker    Packs/day: 0.50    Years: 35.00    Pack years: 17.50    Types: Cigarettes    Quit date: 05/22/2018    Years since quitting: 1.0   Smokeless tobacco: Never Used  Substance Use Topics   Alcohol use: Yes    Comment: 1 glass of wine a night     Family History  Problem Relation Age of Onset   Atrial fibrillation Mother    Ulcers Father    Breast cancer Maternal Aunt    Lung cancer Maternal Uncle    Stomach cancer Maternal Grandmother    Lung cancer Maternal Aunt    Brain cancer Maternal Aunt    Prostate cancer Maternal Grandfather    Stroke Paternal Grandmother    Tuberculosis Paternal Grandfather    Colon cancer Neg Hx    Esophageal cancer Neg Hx    Rectal cancer Neg Hx      ROS   ROS  Exam   There were no vitals filed for this visit. General appearance: WDWN, NAD Eyes: No scleral injection Cardiovascular: Regular rate and rhythm without murmurs, rubs, gallops. No edema or variciosities. Distal pulses normal. Pulmonary: Effort normal, non-labored breathing Musculoskeletal:     Muscle tone upper extremities: Normal    Muscle tone lower  extremities: Normal    Motor exam: Upper Extremities Deltoid Bicep Tricep Grip  Right 5/5 5/5 5/5 5/5  Left 5/5 5/5 5/5 5/5   Lower Extremity IP Quad PF DF EHL  Right 5/5 5/5 5/5 5/5 5/5  Left 5/5 5/5 5/5 5/5 5/5   Neurological Mental Status:    - Patient is awake, alert, oriented to person, place, month, year, and situation    - Patient is able to give a clear and coherent history.    - No signs of aphasia or neglect Cranial Nerves    - II: Visual Fields are full. PERRL    - III/IV/VI: EOMI without ptosis or diploplia.     - V: Facial sensation is grossly normal    - VII: Facial movement is symmetric.     - VIII: hearing is intact to voice    - X: Uvula elevates symmetrically    - XI: Shoulder shrug is symmetric.    - XII: tongue is midline without atrophy or fasciculations.  Sensory: Sensation grossly intact to LT  Results - Imaging/Labs   Results for orders placed or performed during the hospital encounter of 05/27/19 (from  the past 48 hour(s))  SARS CORONAVIRUS 2 (TAT 6-24 HRS) Nasopharyngeal Nasopharyngeal Swab     Status: None   Collection Time: 05/27/19 12:08 PM   Specimen: Nasopharyngeal Swab  Result Value Ref Range   SARS Coronavirus 2 NEGATIVE NEGATIVE    Comment: (NOTE) SARS-CoV-2 target nucleic acids are NOT DETECTED. The SARS-CoV-2 RNA is generally detectable in upper and lower respiratory specimens during the acute phase of infection. Negative results do not preclude SARS-CoV-2 infection, do not rule out co-infections with other pathogens, and should not be used as the sole basis for treatment or other patient management decisions. Negative results must be combined with clinical observations, patient history, and epidemiological information. The expected result is Negative. Fact Sheet for Patients: SugarRoll.be Fact Sheet for Healthcare Providers: https://www.woods-mathews.com/ This test is not yet approved or  cleared by the [place] FDA and  has been authorized for detection and/or diagnosis of SARS-CoV-2 by FDA under an Emergency Use Authorization (EUA). This EUA will remain  in effect (meaning this test can be used) for the duration of the COVID-19 declaration under Section 56 4(b)(1) of the Act, 21 U.S.C. section 360bbb-3(b)(1), unless the authorization is terminated or revoked sooner. Performed at [facility], [place] [address] [place], [place] [address]     No results found.  IMAGING: MRI dated 03/31/2019 was personally reviewed, and compared to prior CT myelogram of November 2019. The current study demonstrates progression of disc degenerative disease with loss of height at L2-3 as well as increase in broad-based disc bulge and resultant severe stenosis at L2-3.    Impression/Plan   74 y.o. female with recurrence of back pain now approximately 1 year after extension of fusion to L3-4.  Imaging has demonstrated significant progression of adjacent level disease at L2-3 with severe stenosis.  We will proceed with decompression at L2-3 with placement of interbody graft, interbody arthrodesis, use of BMP, and extension of fusion to L2-3.  Risks, benefits and alternatives were discussed.  The patient stated understanding and wished to proceed.  Ferne Reus, PA-C [facility] [employer]

## 2019-05-30 ENCOUNTER — Inpatient Hospital Stay (HOSPITAL_COMMUNITY): Payer: PPO

## 2019-05-30 ENCOUNTER — Inpatient Hospital Stay (HOSPITAL_COMMUNITY): Payer: PPO | Admitting: Physician Assistant

## 2019-05-30 ENCOUNTER — Inpatient Hospital Stay (HOSPITAL_COMMUNITY)
Admission: RE | Admit: 2019-05-30 | Discharge: 2019-05-31 | DRG: 460 | Disposition: A | Discharge status: Home / Self Care | Payer: PPO | Attending: Neurosurgery | Admitting: Neurosurgery

## 2019-05-30 ENCOUNTER — Encounter (HOSPITAL_COMMUNITY): Payer: Self-pay | Admitting: Neurosurgery

## 2019-05-30 ENCOUNTER — Inpatient Hospital Stay (HOSPITAL_COMMUNITY)
Admission: RE | Disposition: A | Discharge status: Home / Self Care | Payer: Self-pay | Source: Home / Self Care | Attending: Neurosurgery

## 2019-05-30 ENCOUNTER — Other Ambulatory Visit: Payer: Self-pay

## 2019-05-30 DIAGNOSIS — M4316 Spondylolisthesis, lumbar region: Secondary | ICD-10-CM | POA: Diagnosis not present

## 2019-05-30 DIAGNOSIS — Z20822 Contact with and (suspected) exposure to covid-19: Secondary | ICD-10-CM | POA: Diagnosis present

## 2019-05-30 DIAGNOSIS — Z923 Personal history of irradiation: Secondary | ICD-10-CM | POA: Diagnosis not present

## 2019-05-30 DIAGNOSIS — M4326 Fusion of spine, lumbar region: Secondary | ICD-10-CM | POA: Diagnosis not present

## 2019-05-30 DIAGNOSIS — E039 Hypothyroidism, unspecified: Secondary | ICD-10-CM | POA: Diagnosis present

## 2019-05-30 DIAGNOSIS — Z85828 Personal history of other malignant neoplasm of skin: Secondary | ICD-10-CM | POA: Diagnosis not present

## 2019-05-30 DIAGNOSIS — C50212 Malignant neoplasm of upper-inner quadrant of left female breast: Secondary | ICD-10-CM | POA: Diagnosis present

## 2019-05-30 DIAGNOSIS — Z880 Allergy status to penicillin: Secondary | ICD-10-CM | POA: Diagnosis not present

## 2019-05-30 DIAGNOSIS — Z981 Arthrodesis status: Secondary | ICD-10-CM | POA: Diagnosis not present

## 2019-05-30 DIAGNOSIS — G2581 Restless legs syndrome: Secondary | ICD-10-CM | POA: Diagnosis present

## 2019-05-30 DIAGNOSIS — M47816 Spondylosis without myelopathy or radiculopathy, lumbar region: Secondary | ICD-10-CM | POA: Diagnosis present

## 2019-05-30 DIAGNOSIS — Z888 Allergy status to other drugs, medicaments and biological substances status: Secondary | ICD-10-CM

## 2019-05-30 DIAGNOSIS — F411 Generalized anxiety disorder: Secondary | ICD-10-CM | POA: Diagnosis present

## 2019-05-30 DIAGNOSIS — Z87891 Personal history of nicotine dependence: Secondary | ICD-10-CM

## 2019-05-30 DIAGNOSIS — D62 Acute posthemorrhagic anemia: Secondary | ICD-10-CM | POA: Diagnosis not present

## 2019-05-30 DIAGNOSIS — Z885 Allergy status to narcotic agent status: Secondary | ICD-10-CM

## 2019-05-30 DIAGNOSIS — N183 Chronic kidney disease, stage 3 unspecified: Secondary | ICD-10-CM | POA: Diagnosis present

## 2019-05-30 DIAGNOSIS — I129 Hypertensive chronic kidney disease with stage 1 through stage 4 chronic kidney disease, or unspecified chronic kidney disease: Secondary | ICD-10-CM | POA: Diagnosis present

## 2019-05-30 DIAGNOSIS — Z79890 Hormone replacement therapy (postmenopausal): Secondary | ICD-10-CM

## 2019-05-30 DIAGNOSIS — J439 Emphysema, unspecified: Secondary | ICD-10-CM | POA: Diagnosis present

## 2019-05-30 DIAGNOSIS — F329 Major depressive disorder, single episode, unspecified: Secondary | ICD-10-CM | POA: Diagnosis present

## 2019-05-30 DIAGNOSIS — Z419 Encounter for procedure for purposes other than remedying health state, unspecified: Secondary | ICD-10-CM

## 2019-05-30 DIAGNOSIS — K5909 Other constipation: Secondary | ICD-10-CM | POA: Diagnosis present

## 2019-05-30 DIAGNOSIS — Z79811 Long term (current) use of aromatase inhibitors: Secondary | ICD-10-CM

## 2019-05-30 DIAGNOSIS — M48061 Spinal stenosis, lumbar region without neurogenic claudication: Principal | ICD-10-CM | POA: Diagnosis present

## 2019-05-30 DIAGNOSIS — Z79899 Other long term (current) drug therapy: Secondary | ICD-10-CM

## 2019-05-30 DIAGNOSIS — E782 Mixed hyperlipidemia: Secondary | ICD-10-CM | POA: Diagnosis present

## 2019-05-30 DIAGNOSIS — M109 Gout, unspecified: Secondary | ICD-10-CM | POA: Diagnosis present

## 2019-05-30 DIAGNOSIS — Z170 Estrogen receptor positive status [ER+]: Secondary | ICD-10-CM

## 2019-05-30 LAB — GLUCOSE, CAPILLARY: Glucose-Capillary: 138 mg/dL — ABNORMAL HIGH (ref 70–99)

## 2019-05-30 SURGERY — POSTERIOR LUMBAR FUSION 1 WITH HARDWARE REMOVAL
Anesthesia: General | Site: Spine Lumbar

## 2019-05-30 MED ORDER — PHENYLEPHRINE 40 MCG/ML (10ML) SYRINGE FOR IV PUSH (FOR BLOOD PRESSURE SUPPORT)
PREFILLED_SYRINGE | INTRAVENOUS | Status: AC
  Filled 2019-05-30: qty 10

## 2019-05-30 MED ORDER — PRIMIDONE 50 MG PO TABS
50.0000 mg | ORAL_TABLET | Freq: Every day | ORAL | Status: DC
  Administered 2019-05-30: 22:00:00 50 mg via ORAL
  Filled 2019-05-30: qty 1

## 2019-05-30 MED ORDER — ACETAMINOPHEN 500 MG PO TABS
1000.0000 mg | ORAL_TABLET | Freq: Four times a day (QID) | ORAL | Status: DC
  Administered 2019-05-30 – 2019-05-31 (×3): 1000 mg via ORAL
  Filled 2019-05-30 (×3): qty 2

## 2019-05-30 MED ORDER — GABAPENTIN 300 MG PO CAPS: 300.0000 mg | ORAL_CAPSULE | Freq: Three times a day (TID) | ORAL | Status: DC

## 2019-05-30 MED ORDER — ATORVASTATIN CALCIUM 10 MG PO TABS
20.0000 mg | ORAL_TABLET | Freq: Every day | ORAL | Status: DC
  Administered 2019-05-31: 20 mg via ORAL
  Filled 2019-05-30: qty 2

## 2019-05-30 MED ORDER — ONDANSETRON HCL 4 MG/2ML IJ SOLN
INTRAMUSCULAR | Status: AC
  Filled 2019-05-30: qty 2

## 2019-05-30 MED ORDER — LIDOCAINE 2% (20 MG/ML) 5 ML SYRINGE
INTRAMUSCULAR | Status: DC | PRN
  Administered 2019-05-30: 20 mg via INTRAVENOUS

## 2019-05-30 MED ORDER — ACETAMINOPHEN 10 MG/ML IV SOLN
INTRAVENOUS | Status: AC
  Filled 2019-05-30: qty 100

## 2019-05-30 MED ORDER — OLMESARTAN MEDOXOMIL-HCTZ 40-12.5 MG PO TABS: 1.0000 | ORAL_TABLET | Freq: Every day | ORAL | Status: DC

## 2019-05-30 MED ORDER — SODIUM CHLORIDE 0.9% FLUSH
3.0000 mL | Freq: Two times a day (BID) | INTRAVENOUS | Status: DC
  Administered 2019-05-30: 3 mL via INTRAVENOUS

## 2019-05-30 MED ORDER — HYDROMORPHONE HCL 1 MG/ML IJ SOLN
INTRAMUSCULAR | Status: AC
  Filled 2019-05-30: qty 0.5

## 2019-05-30 MED ORDER — SODIUM CHLORIDE 0.9 % IV SOLN
INTRAVENOUS | Status: DC | PRN
  Administered 2019-05-30: 14:00:00 500 mL

## 2019-05-30 MED ORDER — PROPOFOL 10 MG/ML IV BOLUS
INTRAVENOUS | Status: AC
  Filled 2019-05-30: qty 20

## 2019-05-30 MED ORDER — DEXAMETHASONE SODIUM PHOSPHATE 10 MG/ML IJ SOLN
INTRAMUSCULAR | Status: AC
  Filled 2019-05-30: qty 1

## 2019-05-30 MED ORDER — ALBUTEROL SULFATE (2.5 MG/3ML) 0.083% IN NEBU: 2.5000 mg | INHALATION_SOLUTION | RESPIRATORY_TRACT | Status: DC | PRN

## 2019-05-30 MED ORDER — ONDANSETRON HCL 4 MG PO TABS: 4.0000 mg | ORAL_TABLET | Freq: Four times a day (QID) | ORAL | Status: DC | PRN

## 2019-05-30 MED ORDER — ONDANSETRON HCL 4 MG/2ML IJ SOLN: 4.0000 mg | Freq: Once | INTRAMUSCULAR | Status: DC | PRN

## 2019-05-30 MED ORDER — LIDOCAINE-EPINEPHRINE 1 %-1:100000 IJ SOLN
INTRAMUSCULAR | Status: DC | PRN
  Administered 2019-05-30: 5 mL

## 2019-05-30 MED ORDER — SODIUM CHLORIDE 0.9 % IV SOLN: 250.0000 mL | INTRAVENOUS | Status: DC

## 2019-05-30 MED ORDER — PHENOL 1.4 % MT LIQD: 1.0000 | OROMUCOSAL | Status: DC | PRN

## 2019-05-30 MED ORDER — THROMBIN 5000 UNITS EX SOLR
CUTANEOUS | Status: AC
  Filled 2019-05-30: qty 5000

## 2019-05-30 MED ORDER — LEVOTHYROXINE SODIUM 75 MCG PO TABS
75.0000 ug | ORAL_TABLET | Freq: Every day | ORAL | Status: DC
  Administered 2019-05-31: 07:00:00 75 ug via ORAL
  Filled 2019-05-30: qty 1

## 2019-05-30 MED ORDER — FENTANYL CITRATE (PF) 100 MCG/2ML IJ SOLN
INTRAMUSCULAR | Status: AC
  Filled 2019-05-30: qty 2

## 2019-05-30 MED ORDER — VANCOMYCIN HCL 1500 MG/300ML IV SOLN
1500.0000 mg | INTRAVENOUS | Status: AC
  Administered 2019-05-30: 12:00:00 1500 mg via INTRAVENOUS
  Filled 2019-05-30: qty 300

## 2019-05-30 MED ORDER — BUPIVACAINE HCL (PF) 0.5 % IJ SOLN
INTRAMUSCULAR | Status: AC
  Filled 2019-05-30: qty 30

## 2019-05-30 MED ORDER — LIDOCAINE HCL (CARDIAC) PF 100 MG/5ML IV SOSY
PREFILLED_SYRINGE | INTRAVENOUS | Status: DC | PRN
  Administered 2019-05-30: 80 mg via INTRAVENOUS

## 2019-05-30 MED ORDER — ALLOPURINOL 300 MG PO TABS
300.0000 mg | ORAL_TABLET | Freq: Every day | ORAL | Status: DC
  Administered 2019-05-31: 09:00:00 300 mg via ORAL
  Filled 2019-05-30: qty 1

## 2019-05-30 MED ORDER — SENNOSIDES-DOCUSATE SODIUM 8.6-50 MG PO TABS: 1.0000 | ORAL_TABLET | Freq: Every evening | ORAL | Status: DC | PRN

## 2019-05-30 MED ORDER — MENTHOL 3 MG MT LOZG: 1.0000 | LOZENGE | OROMUCOSAL | Status: DC | PRN

## 2019-05-30 MED ORDER — PHENYLEPHRINE HCL-NACL 10-0.9 MG/250ML-% IV SOLN
INTRAVENOUS | Status: DC | PRN
  Administered 2019-05-30: 50 ug/min via INTRAVENOUS

## 2019-05-30 MED ORDER — DULOXETINE HCL 30 MG PO CPEP
60.0000 mg | ORAL_CAPSULE | Freq: Every day | ORAL | Status: DC
  Administered 2019-05-31: 09:00:00 60 mg via ORAL
  Filled 2019-05-30: qty 2

## 2019-05-30 MED ORDER — SODIUM CHLORIDE 0.9% FLUSH: 3.0000 mL | INTRAVENOUS | Status: DC | PRN

## 2019-05-30 MED ORDER — SODIUM CHLORIDE 0.9 % IV SOLN: INTRAVENOUS | Status: DC

## 2019-05-30 MED ORDER — ACETAMINOPHEN 650 MG RE SUPP: 650.0000 mg | RECTAL | Status: DC | PRN

## 2019-05-30 MED ORDER — HYDROXYZINE PAMOATE 25 MG PO CAPS
25.0000 mg | ORAL_CAPSULE | Freq: Two times a day (BID) | ORAL | Status: DC | PRN
  Filled 2019-05-30: qty 1

## 2019-05-30 MED ORDER — FLEET ENEMA 7-19 GM/118ML RE ENEM: 1.0000 | ENEMA | Freq: Once | RECTAL | Status: DC | PRN

## 2019-05-30 MED ORDER — ALBUMIN HUMAN 5 % IV SOLN: INTRAVENOUS | Status: DC | PRN

## 2019-05-30 MED ORDER — LIDOCAINE 2% (20 MG/ML) 5 ML SYRINGE
INTRAMUSCULAR | Status: AC
  Filled 2019-05-30: qty 5

## 2019-05-30 MED ORDER — METOPROLOL SUCCINATE ER 25 MG PO TB24
12.5000 mg | ORAL_TABLET | Freq: Every day | ORAL | Status: DC
  Administered 2019-05-30 – 2019-05-31 (×2): 12.5 mg via ORAL
  Filled 2019-05-30 (×2): qty 1

## 2019-05-30 MED ORDER — FUROSEMIDE 20 MG PO TABS
20.0000 mg | ORAL_TABLET | Freq: Every day | ORAL | Status: DC
  Administered 2019-05-31: 09:00:00 20 mg via ORAL
  Filled 2019-05-30: qty 1

## 2019-05-30 MED ORDER — DOCUSATE SODIUM 100 MG PO CAPS
100.0000 mg | ORAL_CAPSULE | Freq: Two times a day (BID) | ORAL | Status: DC
  Administered 2019-05-30 – 2019-05-31 (×2): 100 mg via ORAL
  Filled 2019-05-30 (×2): qty 1

## 2019-05-30 MED ORDER — ROCURONIUM BROMIDE 100 MG/10ML IV SOLN
INTRAVENOUS | Status: DC | PRN
  Administered 2019-05-30: 100 mg via INTRAVENOUS
  Administered 2019-05-30: 20 mg via INTRAVENOUS

## 2019-05-30 MED ORDER — ACETAMINOPHEN 10 MG/ML IV SOLN: 1000.0000 mg | Freq: Four times a day (QID) | INTRAVENOUS | Status: DC

## 2019-05-30 MED ORDER — SENNA 8.6 MG PO TABS
1.0000 | ORAL_TABLET | Freq: Two times a day (BID) | ORAL | Status: DC
  Administered 2019-05-30 – 2019-05-31 (×2): 8.6 mg via ORAL
  Filled 2019-05-30 (×2): qty 1

## 2019-05-30 MED ORDER — OXYCODONE HCL 5 MG PO TABS
5.0000 mg | ORAL_TABLET | ORAL | Status: DC | PRN
  Administered 2019-05-31 (×3): 10 mg via ORAL
  Filled 2019-05-30 (×3): qty 2

## 2019-05-30 MED ORDER — SUGAMMADEX SODIUM 200 MG/2ML IV SOLN
INTRAVENOUS | Status: DC | PRN
  Administered 2019-05-30: 300 mg via INTRAVENOUS

## 2019-05-30 MED ORDER — ALBUTEROL SULFATE HFA 108 (90 BASE) MCG/ACT IN AERS: 2.0000 | INHALATION_SPRAY | RESPIRATORY_TRACT | Status: DC | PRN

## 2019-05-30 MED ORDER — PROPOFOL 10 MG/ML IV BOLUS
INTRAVENOUS | Status: DC | PRN
  Administered 2019-05-30: 160 mg via INTRAVENOUS

## 2019-05-30 MED ORDER — EPHEDRINE 5 MG/ML INJ
INTRAVENOUS | Status: AC
  Filled 2019-05-30: qty 10

## 2019-05-30 MED ORDER — LIDOCAINE-EPINEPHRINE 1 %-1:100000 IJ SOLN
INTRAMUSCULAR | Status: AC
  Filled 2019-05-30: qty 1

## 2019-05-30 MED ORDER — SUCCINYLCHOLINE CHLORIDE 200 MG/10ML IV SOSY
PREFILLED_SYRINGE | INTRAVENOUS | Status: AC
  Filled 2019-05-30: qty 10

## 2019-05-30 MED ORDER — VANCOMYCIN HCL IN DEXTROSE 1-5 GM/200ML-% IV SOLN: 1000.0000 mg | Freq: Once | INTRAVENOUS | Status: DC

## 2019-05-30 MED ORDER — ONDANSETRON HCL 4 MG/2ML IJ SOLN
INTRAMUSCULAR | Status: DC | PRN
  Administered 2019-05-30: 4 mg via INTRAVENOUS

## 2019-05-30 MED ORDER — DEXAMETHASONE SODIUM PHOSPHATE 10 MG/ML IJ SOLN
INTRAMUSCULAR | Status: DC | PRN
  Administered 2019-05-30: 10 mg via INTRAVENOUS

## 2019-05-30 MED ORDER — THROMBIN 5000 UNITS EX SOLR
OROMUCOSAL | Status: DC | PRN
  Administered 2019-05-30 (×2): 5 mL via TOPICAL

## 2019-05-30 MED ORDER — HYDROCODONE-ACETAMINOPHEN 5-325 MG PO TABS: 1.0000 | ORAL_TABLET | ORAL | Status: DC | PRN

## 2019-05-30 MED ORDER — BISACODYL 10 MG RE SUPP: 10.0000 mg | Freq: Every day | RECTAL | Status: DC | PRN

## 2019-05-30 MED ORDER — METHOCARBAMOL 500 MG PO TABS: 500.0000 mg | ORAL_TABLET | Freq: Four times a day (QID) | ORAL | Status: DC | PRN

## 2019-05-30 MED ORDER — ROCURONIUM BROMIDE 10 MG/ML (PF) SYRINGE
PREFILLED_SYRINGE | INTRAVENOUS | Status: AC
  Filled 2019-05-30: qty 10

## 2019-05-30 MED ORDER — 0.9 % SODIUM CHLORIDE (POUR BTL) OPTIME
TOPICAL | Status: DC | PRN
  Administered 2019-05-30: 1000 mL

## 2019-05-30 MED ORDER — ONDANSETRON HCL 4 MG/2ML IJ SOLN: 4.0000 mg | Freq: Four times a day (QID) | INTRAMUSCULAR | Status: DC | PRN

## 2019-05-30 MED ORDER — FENTANYL CITRATE (PF) 100 MCG/2ML IJ SOLN
25.0000 ug | INTRAMUSCULAR | Status: DC | PRN
  Administered 2019-05-30: 18:00:00 25 ug via INTRAVENOUS
  Administered 2019-05-30 (×2): 50 ug via INTRAVENOUS
  Administered 2019-05-30: 25 ug via INTRAVENOUS

## 2019-05-30 MED ORDER — BUPIVACAINE HCL (PF) 0.5 % IJ SOLN
INTRAMUSCULAR | Status: DC | PRN
  Administered 2019-05-30: 5 mL

## 2019-05-30 MED ORDER — LACTATED RINGERS IV SOLN: INTRAVENOUS | Status: DC

## 2019-05-30 MED ORDER — MIDAZOLAM HCL 2 MG/2ML IJ SOLN
INTRAMUSCULAR | Status: AC
  Filled 2019-05-30: qty 2

## 2019-05-30 MED ORDER — HYDROXYZINE HCL 25 MG PO TABS
25.0000 mg | ORAL_TABLET | Freq: Two times a day (BID) | ORAL | Status: DC | PRN
  Administered 2019-05-31: 25 mg via ORAL
  Filled 2019-05-30 (×2): qty 1

## 2019-05-30 MED ORDER — EPHEDRINE SULFATE-NACL 50-0.9 MG/10ML-% IV SOSY
PREFILLED_SYRINGE | INTRAVENOUS | Status: DC | PRN
  Administered 2019-05-30 (×2): 5 mg via INTRAVENOUS

## 2019-05-30 MED ORDER — FENTANYL CITRATE (PF) 100 MCG/2ML IJ SOLN
INTRAMUSCULAR | Status: DC | PRN
  Administered 2019-05-30 (×5): 50 ug via INTRAVENOUS

## 2019-05-30 MED ORDER — MIDAZOLAM HCL 5 MG/5ML IJ SOLN
INTRAMUSCULAR | Status: DC | PRN
  Administered 2019-05-30: 2 mg via INTRAVENOUS

## 2019-05-30 MED ORDER — METHOCARBAMOL 1000 MG/10ML IJ SOLN
500.0000 mg | Freq: Four times a day (QID) | INTRAVENOUS | Status: DC | PRN
  Filled 2019-05-30: qty 5

## 2019-05-30 MED ORDER — IRBESARTAN 300 MG PO TABS
300.0000 mg | ORAL_TABLET | Freq: Every day | ORAL | Status: DC
  Administered 2019-05-31: 300 mg via ORAL
  Filled 2019-05-30: qty 1

## 2019-05-30 MED ORDER — DIPHENHYDRAMINE HCL 50 MG/ML IJ SOLN
INTRAMUSCULAR | Status: DC | PRN
  Administered 2019-05-30: 12.5 mg via INTRAVENOUS

## 2019-05-30 MED ORDER — HYDROMORPHONE HCL 1 MG/ML IJ SOLN
0.5000 mg | INTRAMUSCULAR | Status: DC | PRN
  Administered 2019-05-30: 1 mg via INTRAVENOUS
  Filled 2019-05-30: qty 1

## 2019-05-30 MED ORDER — FENTANYL CITRATE (PF) 250 MCG/5ML IJ SOLN
INTRAMUSCULAR | Status: AC
  Filled 2019-05-30: qty 5

## 2019-05-30 MED ORDER — HYDROCHLOROTHIAZIDE 12.5 MG PO CAPS
12.5000 mg | ORAL_CAPSULE | Freq: Every day | ORAL | Status: DC
  Administered 2019-05-31: 09:00:00 12.5 mg via ORAL
  Filled 2019-05-30: qty 1

## 2019-05-30 MED ORDER — ACETAMINOPHEN 325 MG PO TABS: 650.0000 mg | ORAL_TABLET | ORAL | Status: DC | PRN

## 2019-05-30 MED ORDER — HYDROMORPHONE HCL 1 MG/ML IJ SOLN
INTRAMUSCULAR | Status: DC | PRN
  Administered 2019-05-30 (×2): .5 mg via INTRAVENOUS

## 2019-05-30 MED ORDER — ANASTROZOLE 1 MG PO TABS
1.0000 mg | ORAL_TABLET | Freq: Every day | ORAL | Status: DC
  Administered 2019-05-31: 1 mg via ORAL
  Filled 2019-05-30: qty 1

## 2019-05-30 SURGICAL SUPPLY — 81 items
ADH SKN CLS APL DERMABOND .7 (GAUZE/BANDAGES/DRESSINGS) ×1
APL SKNCLS STERI-STRIP NONHPOA (GAUZE/BANDAGES/DRESSINGS)
BAG DECANTER FOR FLEXI CONT (MISCELLANEOUS) ×3 IMPLANT
BASKET BONE COLLECTION (BASKET) ×3 IMPLANT
BENZOIN TINCTURE PRP APPL 2/3 (GAUZE/BANDAGES/DRESSINGS) IMPLANT
BIT DRILL 3.5 POWEREASE (BIT) ×1 IMPLANT
BIT DRILL 3.5MM POWEREASE (BIT) ×1
BLADE CLIPPER SURG (BLADE) IMPLANT
BLADE SURG 11 STRL SS (BLADE) ×3 IMPLANT
BUR MATCHSTICK NEURO 3.0 LAGG (BURR) ×3 IMPLANT
BUR PRECISION FLUTE 5.0 (BURR) ×3 IMPLANT
CANISTER SUCT 3000ML PPV (MISCELLANEOUS) ×3 IMPLANT
CARTRIDGE OIL MAESTRO DRILL (MISCELLANEOUS) ×1 IMPLANT
CLOSURE WOUND 1/2 X4 (GAUZE/BANDAGES/DRESSINGS)
CNTNR URN SCR LID CUP LEK RST (MISCELLANEOUS) ×1 IMPLANT
CONT SPEC 4OZ STRL OR WHT (MISCELLANEOUS) ×3
COVER BACK TABLE 60X90IN (DRAPES) ×3 IMPLANT
COVER WAND RF STERILE (DRAPES) ×3 IMPLANT
DECANTER SPIKE VIAL GLASS SM (MISCELLANEOUS) ×3 IMPLANT
DERMABOND ADVANCED (GAUZE/BANDAGES/DRESSINGS) ×2
DERMABOND ADVANCED .7 DNX12 (GAUZE/BANDAGES/DRESSINGS) ×1 IMPLANT
DEVICE INTERBODY ELEVATE 23X7 (Cage) ×4 IMPLANT
DIFFUSER DRILL AIR PNEUMATIC (MISCELLANEOUS) ×3 IMPLANT
DRAPE C-ARM 42X72 X-RAY (DRAPES) ×3 IMPLANT
DRAPE C-ARMOR (DRAPES) ×3 IMPLANT
DRAPE LAPAROTOMY 100X72X124 (DRAPES) ×3 IMPLANT
DRAPE SURG 17X23 STRL (DRAPES) ×3 IMPLANT
DRSG OPSITE POSTOP 4X8 (GAUZE/BANDAGES/DRESSINGS) ×2 IMPLANT
DURAPREP 26ML APPLICATOR (WOUND CARE) ×3 IMPLANT
ELECT REM PT RETURN 9FT ADLT (ELECTROSURGICAL) ×3
ELECTRODE REM PT RTRN 9FT ADLT (ELECTROSURGICAL) ×1 IMPLANT
GAUZE 4X4 16PLY RFD (DISPOSABLE) IMPLANT
GAUZE SPONGE 4X4 12PLY STRL (GAUZE/BANDAGES/DRESSINGS) IMPLANT
GLOVE BIO SURGEON STRL SZ7.5 (GLOVE) ×4 IMPLANT
GLOVE BIOGEL PI IND STRL 7.0 (GLOVE) IMPLANT
GLOVE BIOGEL PI IND STRL 7.5 (GLOVE) ×2 IMPLANT
GLOVE BIOGEL PI IND STRL 8 (GLOVE) IMPLANT
GLOVE BIOGEL PI INDICATOR 7.0 (GLOVE) ×2
GLOVE BIOGEL PI INDICATOR 7.5 (GLOVE) ×4
GLOVE BIOGEL PI INDICATOR 8 (GLOVE) ×2
GLOVE ECLIPSE 7.0 STRL STRAW (GLOVE) ×6 IMPLANT
GLOVE EXAM NITRILE XL STR (GLOVE) IMPLANT
GLOVE SURG SS PI 8.0 STRL IVOR (GLOVE) ×2 IMPLANT
GOWN STRL REUS W/ TWL LRG LVL3 (GOWN DISPOSABLE) ×2 IMPLANT
GOWN STRL REUS W/ TWL XL LVL3 (GOWN DISPOSABLE) IMPLANT
GOWN STRL REUS W/TWL 2XL LVL3 (GOWN DISPOSABLE) IMPLANT
GOWN STRL REUS W/TWL LRG LVL3 (GOWN DISPOSABLE) ×9
GOWN STRL REUS W/TWL XL LVL3 (GOWN DISPOSABLE) ×3
HEMOSTAT POWDER KIT SURGIFOAM (HEMOSTASIS) ×5 IMPLANT
KIT BASIN OR (CUSTOM PROCEDURE TRAY) ×3 IMPLANT
KIT INFUSE XX SMALL 0.7CC (Orthopedic Implant) ×2 IMPLANT
KIT POSITION SURG JACKSON T1 (MISCELLANEOUS) ×3 IMPLANT
KIT TURNOVER KIT B (KITS) ×3 IMPLANT
MILL MEDIUM DISP (BLADE) ×3 IMPLANT
NDL HYPO 18GX1.5 BLUNT FILL (NEEDLE) IMPLANT
NDL SPNL 18GX3.5 QUINCKE PK (NEEDLE) IMPLANT
NEEDLE HYPO 18GX1.5 BLUNT FILL (NEEDLE) IMPLANT
NEEDLE HYPO 22GX1.5 SAFETY (NEEDLE) ×3 IMPLANT
NEEDLE SPNL 18GX3.5 QUINCKE PK (NEEDLE) IMPLANT
NS IRRIG 1000ML POUR BTL (IV SOLUTION) ×3 IMPLANT
OIL CARTRIDGE MAESTRO DRILL (MISCELLANEOUS) ×3
PACK LAMINECTOMY NEURO (CUSTOM PROCEDURE TRAY) ×3 IMPLANT
PAD ARMBOARD 7.5X6 YLW CONV (MISCELLANEOUS) ×9 IMPLANT
ROD 120MM (Rod) ×6 IMPLANT
ROD SPNL CVD 120X4.75X (Rod) IMPLANT
SCREW 5.5X35MM (Screw) ×6 IMPLANT
SCREW BN 35X5.5XMA NS SPNE (Screw) IMPLANT
SCREW SET SOLERA (Screw) ×30 IMPLANT
SCREW SET SOLERA TI (Screw) IMPLANT
SEALER EPIDURAL VEIN MINI EVS (INSTRUMENTS) ×2 IMPLANT
SPONGE LAP 4X18 RFD (DISPOSABLE) IMPLANT
SPONGE SURGIFOAM ABS GEL 100 (HEMOSTASIS) IMPLANT
STRIP CLOSURE SKIN 1/2X4 (GAUZE/BANDAGES/DRESSINGS) IMPLANT
SUT VIC AB 0 CT1 18XCR BRD8 (SUTURE) ×1 IMPLANT
SUT VIC AB 0 CT1 8-18 (SUTURE) ×12
SUT VICRYL 3-0 RB1 18 ABS (SUTURE) ×7 IMPLANT
SYR 3ML LL SCALE MARK (SYRINGE) ×9 IMPLANT
TOWEL GREEN STERILE (TOWEL DISPOSABLE) ×3 IMPLANT
TOWEL GREEN STERILE FF (TOWEL DISPOSABLE) ×3 IMPLANT
TRAY FOLEY MTR SLVR 16FR STAT (SET/KITS/TRAYS/PACK) ×3 IMPLANT
WATER STERILE IRR 1000ML POUR (IV SOLUTION) ×3 IMPLANT

## 2019-05-30 NOTE — Transfer of Care (Signed)
Immediate Anesthesia Transfer of Care Note  Patient: Brandi Stephens  Procedure(s) Performed: Lumbar two-three Posterior lumbar interbody fusion with interbody arthrodesis, bone morphogenic product, removal of previous rod, extension of fusion to Lumbar two (N/A Spine Lumbar)  Patient Location: PACU  Anesthesia Type:General  Level of Consciousness: awake, alert  and oriented  Airway & Oxygen Therapy: Patient connected to face mask oxygen  Post-op Assessment: Post -op Vital signs reviewed and stable  Post vital signs: stable  Last Vitals:  Vitals Value Taken Time  BP    Temp    Pulse    Resp    SpO2      Last Pain:  Vitals:   05/30/19 1106  TempSrc:   PainSc: 6          Complications: No apparent anesthesia complications

## 2019-05-30 NOTE — Op Note (Signed)
NEUROSURGERY OPERATIVE NOTE   PREOP DIAGNOSIS:  1. Lumbar spinal stenosis without claudication, L2-3 2. Lumbar spondylosis, L2-3 3. Adjacent level disease, L2-3  POSTOP DIAGNOSIS: Same  PROCEDURE: 1. L2 laminectomy with facetectomy for decompression of exiting nerve roots, more than would be required for placement of interbody graft 2. Placement of anterior interbody device - Medtronic Rise 46mm lordotic expandable cages x2 3. Extension of previous instrumentation, L2-S1 including removal of previous rod 4. Interbody arthrodesis, L2-3 5. Use of locally harvested bone autograft 6. Use of non-structural bone allograft - BMP  SURGEON: Dr. [name], MD  ASSISTANT: Dr. Ashok Pall, MD who was present during key portions of the procedure including hardware placement and closure.  ANESTHESIA: General Endotracheal  EBL: 500cc  SPECIMENS: None  DRAINS: None  COMPLICATIONS: None immediate  CONDITION: Hemodynamically stable to PACU  HISTORY: Brandi Stephens is a 74 y.o. female who has been followed in the outpatient clinic with recurrence of back and leg pain related to stenosis at L2-3 after prior L3-S1 decompression and fusion. She elected to proceed with surgical decompression and fusion with extension of the construct to L2. Risks, benefits, and alternative treatments were reviewed in detail in the office with the patient and her daughter. After all questions were answered, informed consent was obtained and witnessed.  PROCEDURE IN DETAIL: The patient was brought to the operating room via stretcher. After induction of general anesthesia, the patient was positioned on the operative table in the prone position. All pressure points were meticulously padded. Previous skin incision was then marked out and prepped and draped in the usual sterile fashion.  After timeout was conducted, skin was infiltrated with local anesthetic. Skin incision was then made sharply extending  superiorly from the previous incision, and Bovie electrocautery was used to dissect the subcutaneous tissue until the lumbodorsal fascia was identified and incised. The muscle was then elevated in the subperiosteal plane and the L2 lamina and L2-3 facet complexes were identified.  The L3 cortical pedicle screws were also identified confirming our location at the correct level.  Self-retaining retractors were then placed.   At this point attention was turned to decompression. Complete L2 laminectomy was completed with a high-speed drill and Kerrison punches.  Normal dura was identified.  A ball-tipped dissector was then used to identify the foramina bilaterally.  High-speed drill was used to cut across the pars interarticularis and the inferior articulating process of L2 was removed bilaterally.  The exiting nerve roots and the traversing nerve roots were then identified.  I was then able to easily pass a ball dissector in the ventral epidural space and along both the L2 and L3 nerve roots and out their respective foramina indicating good decompression.  Disc space was then identified, incised bilaterally, and using a combination of shavers, curettes and rongeurs, complete discectomy was completed. Endplates were prepared, and bone harvested during decompression was mixed with BMP and packed into the interspace.  7 mm lordotic cages were tapped into place bilaterally. Cages were expanded to 11 mm.  Good position was confirmed with fluoroscopy.  At this point, the entry points for bilateral L2 cortical pedicle screws were identified using standard anatomic landmarks and lateral fluoro. Pilot holes were then drilled and tapped to 5.5 x 35 mm. Screws were then placed in L2.at this point, dissection along the previous identified L3 cortical pedicle screws and rod was carried inferiorly.  This required opening of the more inferior portion of the previous incision.  The previous  L3 cortical pedicle screws, L4, L5,  and S1 screws were then all identified as was the intervening rod.  The rod was cleaned of any overlying soft tissue.  Setscrews were then removed and the previous rod was removed bilaterally.  New pre-bent 120 mm lordotic rod was then placed into the set screws including L2.  Set screws were then placed in L2, L3, L4, L5, and S1.  These were final tightened.  Final AP and lateral fluoroscopic images confirmed good location of the interbody devices and screw rod construct.  Hemostasis was secured and confirmed with bipolar cautery and morcellized gelfoam with thrombin. The wound was then irrigated with copious amounts of antibiotic saline, then closed in standard fashion using a combination of interrupted 0 and 3-0 Vicryl stitches in the muscular, fascial, and subcutaneous layers. Skin was then closed using standard Dermabond. Sterile dressing was then applied. The patient was then transferred to the stretcher, extubated, and taken to the postanesthesia care unit in stable hemodynamic condition.  At the end of the case all sponge, needle, cottonoid, and instrument counts were correct.

## 2019-05-30 NOTE — Progress Notes (Signed)
Pharmacy Antibiotic Note  Brandi Stephens is a 74 y.o. female admitted on 05/30/2019 with surgical prophylaxis.  Pharmacy has been consulted for Vancomycin dosing.  Plan: - Last Scr was 2/8 and was 1.78  - With current renal function would not dose vancomycin more closely than every 24 hr  - With this will give Vancomycin 1000mg  IV x 1 dose tomorrow at 1200  - BMP ordered in AM  - Pharmacy will sign off as drain was not placed.   Height: 5\' 8"  (172.7 cm) Weight: 255 lb (115.7 kg) IBW/kg (Calculated) : 63.9  Temp (24hrs), Avg:98.7 F (37.1 C), Min:98.3 F (36.8 C), Max:99.3 F (37.4 C)  Recent Labs  Lab 05/27/19 1130  WBC 9.0  CREATININE 1.78*    Estimated Creatinine Clearance: 38.2 mL/min (A) (by C-G formula based on SCr of 1.78 mg/dL (H)).    Allergies  Allergen Reactions  . Ampicillin Hives and Other (See Comments)    Severe reaction in February 2017 1.5 month to have hives to go away  . Penicillins Hives and Other (See Comments)    Severe reaction in February 2017 1.5 month to have hives to go away Has patient had a PCN reaction causing immediate rash, facial/tongue/throat swelling, SOB or lightheadedness with hypotension: #  #  #  YES  #  #  #  Has patient had a PCN reaction causing severe rash involving mucus membranes or skin necrosis: No Has patient had a PCN reaction that required hospitalization No Has patient had a PCN reaction occurring within the last 10 years: No   . Gabapentin Swelling    UNSPECIFIED REACTION   . Other Itching    UNSPECIFIED Analgesics  . Codeine Nausea And Vomiting  . Hydrocodone-Acetaminophen Itching  . Hydromorphone Itching    Pt has taken w/o issues    Antimicrobials this admission: 2/11 Vancomycin >>  Thank you for allowing pharmacy to be a part of this patient's care.  Duanne Limerick PharmD. BCPS  05/30/2019 8:10 PM

## 2019-05-30 NOTE — Anesthesia Postprocedure Evaluation (Signed)
Anesthesia Post Note  Patient: Brandi Stephens  Procedure(s) Performed: Lumbar two-three Posterior lumbar interbody fusion with interbody arthrodesis, bone morphogenic product, removal of previous rod, extension of fusion to Lumbar two (N/A Spine Lumbar)     Patient location during evaluation: PACU Anesthesia Type: General Level of consciousness: awake and alert Pain management: pain level controlled Vital Signs Assessment: post-procedure vital signs reviewed and stable Respiratory status: spontaneous breathing, nonlabored ventilation, respiratory function stable and patient connected to nasal cannula oxygen Cardiovascular status: blood pressure returned to baseline and stable Postop Assessment: no apparent nausea or vomiting Anesthetic complications: no    Last Vitals:  Vitals:   05/30/19 1858 05/30/19 1926  BP: (!) 104/54 133/65  Pulse: 90 100  Resp: 18 18  Temp:  36.8 C  SpO2: 93% 99%    Last Pain:  Vitals:   05/30/19 1926  TempSrc: Oral  PainSc:                  [name]

## 2019-05-30 NOTE — Anesthesia Procedure Notes (Signed)
Procedure Name: Intubation Date/Time: 05/30/2019 1:34 PM Performed by: Lavell Luster, CRNA Pre-anesthesia Checklist: Patient identified, Emergency Drugs available, Suction available, Patient being monitored and Timeout performed Patient Re-evaluated:Patient Re-evaluated prior to induction Oxygen Delivery Method: Circle system utilized Preoxygenation: Pre-oxygenation with 100% oxygen Induction Type: IV induction Ventilation: Mask ventilation without difficulty Laryngoscope Size: Mac and 4 Grade View: Grade II Tube type: Oral Tube size: 7.5 mm Number of attempts: 1 Airway Equipment and Method: Stylet Placement Confirmation: ETT inserted through vocal cords under direct vision,  positive ETCO2 and breath sounds checked- equal and bilateral Secured at: 22 cm Tube secured with: Tape Dental Injury: Teeth and Oropharynx as per pre-operative assessment

## 2019-05-31 LAB — BASIC METABOLIC PANEL
Anion gap: 10 (ref 5–15)
BUN: 51 mg/dL — ABNORMAL HIGH (ref 8–23)
CO2: 23 mmol/L (ref 22–32)
Calcium: 8.8 mg/dL — ABNORMAL LOW (ref 8.9–10.3)
Chloride: 103 mmol/L (ref 98–111)
Creatinine, Ser: 1.95 mg/dL — ABNORMAL HIGH (ref 0.44–1.00)
GFR calc Af Amer: 29 mL/min — ABNORMAL LOW (ref >60)
GFR calc non Af Amer: 25 mL/min — ABNORMAL LOW (ref >60)
Glucose, Bld: 219 mg/dL — ABNORMAL HIGH (ref 70–99)
Potassium: 4.7 mmol/L (ref 3.5–5.1)
Sodium: 136 mmol/L (ref 135–145)

## 2019-05-31 LAB — CBC
HCT: 25.8 % — ABNORMAL LOW (ref 36.0–46.0)
HCT: 29.3 % — ABNORMAL LOW (ref 36.0–46.0)
Hemoglobin: 8.2 g/dL — ABNORMAL LOW (ref 12.0–15.0)
Hemoglobin: 9.3 g/dL — ABNORMAL LOW (ref 12.0–15.0)
MCH: 32.2 pg (ref 26.0–34.0)
MCH: 32.5 pg (ref 26.0–34.0)
MCHC: 31.8 g/dL (ref 30.0–36.0)
MCHC: 31.7 g/dL (ref 30.0–36.0)
MCV: 101.2 fL — ABNORMAL HIGH (ref 80.0–100.0)
MCV: 102.4 fL — ABNORMAL HIGH (ref 80.0–100.0)
Platelets: 182 10*3/uL (ref 150–400)
Platelets: 245 10*3/uL (ref 150–400)
RBC: 2.55 MIL/uL — ABNORMAL LOW (ref 3.87–5.11)
RBC: 2.86 MIL/uL — ABNORMAL LOW (ref 3.87–5.11)
RDW: 13.3 % (ref 11.5–15.5)
RDW: 13.4 % (ref 11.5–15.5)
WBC: 9.7 10*3/uL (ref 4.0–10.5)
WBC: 11.7 10*3/uL — ABNORMAL HIGH (ref 4.0–10.5)
nRBC: 0 % (ref 0.0–0.2)
nRBC: 0 % (ref 0.0–0.2)

## 2019-05-31 LAB — PROTIME-INR
INR: 1 (ref 0.8–1.2)
Prothrombin Time: 13.6 seconds (ref 11.4–15.2)

## 2019-05-31 LAB — APTT: aPTT: 25 seconds (ref 24–36)

## 2019-05-31 MED ORDER — METHOCARBAMOL 750 MG PO TABS: 750.0000 mg | ORAL_TABLET | Freq: Three times a day (TID) | ORAL | 1 refills | Status: DC | PRN

## 2019-05-31 MED ORDER — DIPHENHYDRAMINE HCL 25 MG PO CAPS
25.0000 mg | ORAL_CAPSULE | ORAL | Status: DC | PRN
  Administered 2019-05-31: 25 mg via ORAL
  Filled 2019-05-31: qty 1

## 2019-05-31 MED FILL — Thrombin For Soln 5000 Unit: CUTANEOUS | Qty: 5000 | Status: AC

## 2019-05-31 NOTE — Plan of Care (Signed)
Patient alert and oriented, mae's well, voiding adequate amount of urine, swallowing without difficulty, no c/o pain at time of discharge. Patient discharged home with family. Script and discharged instructions given to patient. Patient and family stated understanding of instructions given. Patient has an appointment with Dr. Nundkumar  

## 2019-05-31 NOTE — Progress Notes (Addendum)
  NEUROSURGERY PROGRESS NOTE   No issues overnight.  Complains of appropriate back soreness Denies N/T/W  EXAM:  BP 125/70 (BP Location: Right Arm)   Pulse 100   Temp 98.3 F (36.8 C) (Oral)   Resp 20   Ht 5\' 8"  (1.727 m)   Wt 115.7 kg   SpO2 98%   BMI 38.77 kg/m   Awake, alert, oriented  Speech fluent, appropriate  CN grossly intact  MAEW, nonfocal Incision: blood on inferior bandage, no active drainage  IMPRESSION/PLAN 74 y.o. female pod #1 L2-3 decompression, extension of prior fusion. Doing well.  - Hgb 8.2 this am. Will repeat to ensure accuracy. Appears asx. If repeat is low, will transfuse 1 unit. Otherwise cleared for d/c  Addendum Repeat CBC reviewed. And improved as below. No indication for transfusion. cleared for discharge. Rec repeat CBC/BMP in 1 week  CBC Latest Ref Rng & Units 05/31/2019 05/31/2019 05/27/2019  WBC 4.0 - 10.5 K/uL 11.7(H) 9.7 9.0  Hemoglobin 12.0 - 15.0 g/dL 9.3(L) 8.2(L) 12.5  Hematocrit 36.0 - 46.0 % 29.3(L) 25.8(L) 38.3  Platelets 150 - 400 K/uL 245 182 271

## 2019-05-31 NOTE — Discharge Summary (Signed)
Physician Discharge Summary  Patient ID: Brandi Stephens MRN: [id number] DOB/AGE: 73-07-1945 73 y.o.  Admit date: 05/30/2019 Discharge date: 05/31/2019  Admission Diagnoses:  Lumbar spinal stenosis  Discharge Diagnoses:  Same ABL Anemia, asymptomic Active Problems:   Lumbar spinal stenosis  Discharged Condition: Stable  Hospital Course:  Brandi Stephens is a 74 y.o. female who was admitted for the below procedure. There were no post operative complications. At time of discharge, pain was well controlled, ambulating with Pt/OT, tolerating po, voiding normal. Ready for discharge.  Treatments: Surgery 1. L2 laminectomy with facetectomy for decompression of exiting nerve roots, more than would be required for placement of interbody graft 2. Placement of anterior interbody device - Medtronic Rise 32mm lordotic expandable cages x2 3. Extension of previous instrumentation, L2-S1 including removal of previous rod 4. Interbody arthrodesis, L2-3 5. Use of locally harvested bone autograft 6. Use of non-structural bone allograft - BMP   Discharge Exam: Blood pressure 125/70, pulse 100, temperature 98.3 F (36.8 C), temperature source Oral, resp. rate 20, height 5\' 8"  (1.727 m), weight 115.7 kg, SpO2 98 %. Awake, alert, oriented Speech fluent, appropriate CN grossly intact MAEW, nonfocal Wound dried blood, no active bleeding  Disposition:   Discharge Instructions    Call MD for:  difficulty breathing, headache or visual disturbances   Complete by: As directed    Call MD for:  persistant dizziness or light-headedness   Complete by: As directed    Call MD for:  redness, tenderness, or signs of infection (pain, swelling, redness, odor or green/yellow discharge around incision site)   Complete by: As directed    Call MD for:  severe uncontrolled pain   Complete by: As directed    Call MD for:  temperature >100.4   Complete by: As directed    Diet general   Complete by: As  directed    Driving Restrictions   Complete by: As directed    Do not drive until given clearance.   Increase activity slowly   Complete by: As directed    Lifting restrictions   Complete by: As directed    Do not lift anything >10lbs. Avoid bending and twisting in awkward positions. Avoid bending at the back.   May shower / Bathe   Complete by: As directed    In 24 hours. Okay to wash wound with warm soapy water. Avoid scrubbing the wound. Pat dry.   Remove dressing in 24 hours   Complete by: As directed      Allergies as of 05/31/2019      Reactions   Ampicillin Hives, Other (See Comments)   Severe reaction in February 2017 1.5 month to have hives to go away   Penicillins Hives, Other (See Comments)   Severe reaction in February 2017 1.5 month to have hives to go away Has patient had a PCN reaction causing immediate rash, facial/tongue/throat swelling, SOB or lightheadedness with hypotension: #  #  #  YES  #  #  #  Has patient had a PCN reaction causing severe rash involving mucus membranes or skin necrosis: No Has patient had a PCN reaction that required hospitalization No Has patient had a PCN reaction occurring within the last 10 years: No   Gabapentin Swelling   UNSPECIFIED REACTION    Other Itching   UNSPECIFIED Analgesics   Codeine Nausea And Vomiting   Hydrocodone-acetaminophen Itching   Hydromorphone Itching   Pt has taken w/o issues      Medication  List    STOP taking these medications   baclofen 10 MG tablet Commonly known as: LIORESAL   busPIRone 15 MG tablet Commonly known as: BUSPAR   HYDROcodone-acetaminophen 5-325 MG tablet Commonly known as: NORCO/VICODIN     TAKE these medications   albuterol 108 (90 Base) MCG/ACT inhaler Commonly known as: VENTOLIN HFA INHALE 2 PUFFS BY MOUTH EVERY 4 TO 6 HOURS AS NEEDED What changed:   how much to take  how to take this  when to take this  reasons to take this  additional instructions   allopurinol  300 MG tablet Commonly known as: ZYLOPRIM TAKE 1 TABLET(300 MG) BY MOUTH DAILY What changed:   See the new instructions.  Another medication with the same name was removed. Continue taking this medication, and follow the directions you see here.   anastrozole 1 MG tablet Commonly known as: ARIMIDEX TAKE 1 TABLET(1 MG) BY MOUTH DAILY What changed: See the new instructions.   atorvastatin 20 MG tablet Commonly known as: LIPITOR TAKE 1 TABLET BY MOUTH EVERY DAY   Biotin 5000 5 MG Caps Generic drug: Biotin Take 5 mg by mouth daily.   CALCIUM 1000 + D PO Take 1 tablet by mouth daily.   colchicine 0.6 MG tablet Take 2 tablets now and 1 tablet an hour later for flare up What changed:   how much to take  how to take this  when to take this  reasons to take this  additional instructions   diclofenac Sodium 1 % Gel Commonly known as: Voltaren Apply 4 g topically 4 (four) times daily. As needed for knee pain   diphenhydrAMINE 25 MG tablet Commonly known as: BENADRYL Take 25 mg by mouth daily as needed for itching.   DULoxetine 60 MG capsule Commonly known as: Cymbalta Take 1 capsule (60 mg total) by mouth daily.   furosemide 20 MG tablet Commonly known as: LASIX TAKE 1 TABLET BY MOUTH DAILY AS NEEDED What changed: when to take this   hydrOXYzine 25 MG capsule Commonly known as: VISTARIL Take 25 mg by mouth 2 (two) times daily as needed for anxiety.   Krill Oil 350 MG Caps Take 700 mg by mouth daily.   levothyroxine 75 MCG tablet Commonly known as: SYNTHROID Take 1 tablet (75 mcg total) by mouth daily before breakfast.   Magnesium 500 MG Caps Take 500 mg by mouth daily.   Melatonin 5 MG/15ML Liqd Take 10 mg by mouth at bedtime.   methocarbamol 750 MG tablet Commonly known as: ROBAXIN Take 750 mg by mouth 3 (three) times daily. What changed: Another medication with the same name was added. Make sure you understand how and when to take each.    methocarbamol 750 MG tablet Commonly known as: Robaxin-750 Take 1 tablet (750 mg total) by mouth 3 (three) times daily as needed for muscle spasms. What changed: You were already taking a medication with the same name, and this prescription was added. Make sure you understand how and when to take each.   metoprolol succinate 25 MG 24 hr tablet Commonly known as: TOPROL-XL TAKE 1/2 TABLET(12.5 MG) BY MOUTH DAILY What changed:   how much to take  how to take this  when to take this   MiraLax 17 GM/SCOOP powder Generic drug: polyethylene glycol powder Take 17 g by mouth daily as needed for moderate constipation.   olmesartan-hydrochlorothiazide 40-12.5 MG tablet Commonly known as: BENICAR HCT TAKE 1 TABLET BY MOUTH DAILY   primidone 50  MG tablet Commonly known as: MYSOLINE TAKE 1 TABLET(50 MG) BY MOUTH AT BEDTIME What changed: See the new instructions.   rOPINIRole 0.5 MG tablet Commonly known as: REQUIP Take 1.5 tablets (0.75 mg total) by mouth at bedtime.   vitamin B-12 1000 MCG tablet Commonly known as: CYANOCOBALAMIN Take 1,000 mcg by mouth daily.      Follow-up Information    [name], MD. Schedule an appointment as soon as possible for a visit in 3 week(s).   Specialty: Neurosurgery Contact information: [address] [place] [address] [phone]           Signed: Traci Sermon 05/31/2019, 8:06 AM

## 2019-05-31 NOTE — Evaluation (Addendum)
Occupational Therapy Evaluation Patient Details Name: Brandi Stephens MRN: [id number] DOB: Feb 23, 1946 Today's Date: 05/31/2019    History of Present Illness 74 yo female s/p decompression fusion at L2-3 with extension of prior fusion on 05/30/19. PMH including multiple lumbar decompressiosn and fusions (L4-5, L5-1 2016, L3-4 with extension of prior fusion 2020), CKD, generalized anxiety disorder, HTN, and neuropathy.    Clinical Impression   PTA, pt was living with her husband and daughter and was independent. Currently, pt requires Min Guard-Min A for LB ADLs and Supervision-Min Guard A for functional mobility. Provided education and handout on back precautions, brace management, bed mobility, grooming, UB ADLs, LB ADLs with AE, toileting, and functional transfers; pt demonstrated understanding. Answered all pt questions. Recommend dc home once medically stable per physician. All acute OT needs met and will sign off. Thank you.    Follow Up Recommendations  No OT follow up;Supervision/Assistance - 24 hour    Equipment Recommendations  None recommended by OT    Recommendations for Other Services PT consult     Precautions / Restrictions Precautions Precautions: Back Precaution Booklet Issued: Yes (comment) Precaution Comments: Reviewed all back precautions and compensatory techniques for ADLs Required Braces or Orthoses: Spinal Brace Spinal Brace: Lumbar corset;Applied in sitting position      Mobility Bed Mobility               General bed mobility comments: Pt sitting at EOB with NT getting vitals. Reviewed log roll technique  Transfers Overall transfer level: Needs assistance Equipment used: None Transfers: Sit to/from Stand Sit to Stand: Supervision;Min guard         General transfer comment: Supervision-Min Guard A for safety    Balance Overall balance assessment: No apparent balance deficits (not formally assessed)                                          ADL either performed or assessed with clinical judgement   ADL Overall ADL's : Needs assistance/impaired Eating/Feeding: Independent;Sitting   Grooming: Wash/dry face;Wash/dry hands;Min guard;Standing   Upper Body Bathing: Set up;Supervision/ safety;Sitting   Lower Body Bathing: Min guard;Sit to/from stand   Upper Body Dressing : Supervision/safety;Set up;Sitting Upper Body Dressing Details (indicate cue type and reason): donned shirt and brace Lower Body Dressing: Minimal assistance;Cueing for safety;Cueing for sequencing;With adaptive equipment;Sit to/from stand Lower Body Dressing Details (indicate cue type and reason): Min A for managing reaching while donning underwear and pants. Mod cues as pt highly distracted by RN cleaning room Toilet Transfer: Supervision/safety;Ambulation;Regular Toilet     Toileting - Clothing Manipulation Details (indicate cue type and reason): Educated pt on toilet hygiene     Functional mobility during ADLs: Min guard General ADL Comments: Pt performing ADLs at [place] A level. Min Cues for adherance to back precautions. Provdiing education on back precautions, bed mobility, grooming, UB ADLs, brace management, LB ADLs with AE, toileting, and functional transfers.      Vision         Perception     Praxis      Pertinent Vitals/Pain Pain Assessment: 0-10 Pain Score: 8  Pain Location: Back' Pain Descriptors / Indicators: Constant;Discomfort Pain Intervention(s): Monitored during session;Limited activity within patient's tolerance;Repositioned     Hand Dominance Right   Extremity/Trunk Assessment Upper Extremity Assessment Upper Extremity Assessment: Overall WFL for tasks assessed   Lower Extremity Assessment Lower Extremity  Assessment: Defer to PT evaluation   Cervical / Trunk Assessment Cervical / Trunk Assessment: Other exceptions Cervical / Trunk Exceptions: s/p back sx   Communication  Communication Communication: No difficulties   Cognition Arousal/Alertness: Awake/alert Behavior During Therapy: Anxious Overall Cognitive Status: Within Functional Limits for tasks assessed                                 General Comments: Pt very fidgety and requiring calm cues for anxiety.   General Comments  VSS. While OT goign to get AE for session, pt accidently pulling IV from hand while sitting at EOB. Upon return, pt bleeding from IV site. Notified RN who pulled IV.    Exercises     Shoulder Instructions      Home Living Family/patient expects to be discharged to:: Private residence Living Arrangements: Spouse/significant other Available Help at Discharge: Family;Available 24 hours/day Type of Home: House Home Access: Stairs to enter [employer] of Steps: 2 Entrance Stairs-Rails: None Home Layout: Two level Alternate Level Stairs-Number of Steps: flight Alternate Level Stairs-Rails: Can reach both Bathroom Shower/Tub: Walk-in shower;Tub/shower unit   Bathroom Toilet: Handicapped height     Home Equipment: Environmental consultant - 2 wheels;Cane - single point;Grab bars - tub/shower;Walker - 4 wheels;Adaptive equipment;Bedside commode;Shower seat;Tub bench Adaptive Equipment: Reacher        Prior Functioning/Environment Level of Independence: Independent with assistive device(s)        Comments: Reports she was using RW recently with pain        OT Problem List: Decreased strength;Decreased range of motion;Decreased activity tolerance;Impaired balance (sitting and/or standing);Decreased knowledge of use of DME or AE;Decreased knowledge of precautions      OT Treatment/Interventions:      OT Goals(Current goals can be found in the care plan section) Acute Rehab OT Goals Patient Stated Goal: "Go home" OT Goal Formulation: All assessment and education complete, DC therapy  OT Frequency:     Barriers to D/C:            Co-evaluation               AM-PAC OT "6 Clicks" Daily Activity     Outcome Measure Help from another person eating meals?: None Help from another person taking care of personal grooming?: None Help from another person toileting, which includes using toliet, bedpan, or urinal?: A Little Help from another person bathing (including washing, rinsing, drying)?: A Little Help from another person to put on and taking off regular upper body clothing?: None Help from another person to put on and taking off regular lower body clothing?: A Little 6 Click Score: 21   End of Session Nurse Communication: Mobility status  Activity Tolerance: Patient tolerated treatment well Patient left: in chair;with call bell/phone within reach  OT Visit Diagnosis: Unsteadiness on feet (R26.81);Other abnormalities of gait and mobility (R26.89);Muscle weakness (generalized) (M62.81)                Time: 7169-6789 OT Time Calculation (min): 34 min Charges:  OT General Charges $OT Visit: 1 Visit OT Evaluation $OT Eval Low Complexity: 1 Low OT Treatments $Self Care/Home Management : 8-22 mins  Shyanne Mcclary MSOT, OTR/L Acute Rehab Pager: [phone] Office: [place] 05/31/2019, 1:33 PM

## 2019-05-31 NOTE — Evaluation (Signed)
Physical Therapy Evaluation Patient Details Name: Brandi Stephens MRN: [id number] DOB: 21-Jun-1945 Today's Date: 05/31/2019   History of Present Illness  74 yo female s/p decompression fusion at L2-3 with extension of prior fusion on 05/30/19. PMH including multiple lumbar decompressiosn and fusions (L4-5, L5-1 2016, L3-4 with extension of prior fusion 2020), CKD, generalized anxiety disorder, HTN, and neuropathy.   Clinical Impression  Pt admitted with above diagnosis. At the time of PT eval, pt was able to demonstrate transfers and ambulation with gross min guard assist to supervision for safety. We did initiate stair training and pt was able to clear 5 stairs well without assistance. VC's for sequencing and safety only. Pt was educated on precautions, brace application/wearing schedule, appropriate activity progression, and car transfer. Brace adjusted for optimal fit. Pt currently with functional limitations due to the deficits listed below (see PT Problem List). Pt will benefit from skilled PT to increase their independence and safety with mobility to allow discharge to the venue listed below.      Follow Up Recommendations No PT follow up;Supervision for mobility/OOB    Equipment Recommendations  None recommended by PT    Recommendations for Other Services       Precautions / Restrictions Precautions Precautions: Back Precaution Booklet Issued: Yes (comment) Precaution Comments: Reviewed handout and pt was cued for precautions during functional mobility.  Required Braces or Orthoses: Spinal Brace Spinal Brace: Lumbar corset;Applied in sitting position      Mobility  Bed Mobility               General bed mobility comments: Pt was received sitting up in recliner upon PT arrival.   Transfers Overall transfer level: Needs assistance Equipment used: None Transfers: Sit to/from Stand Sit to Stand: Supervision;Min guard         General transfer comment: Close guard for  safety as pt with decreased safety awareness at times. Overall no assistance required.   Ambulation/Gait Ambulation/Gait assistance: Min guard;Supervision Gait Distance (Feet): 200 Feet Assistive device: Rolling walker (2 wheeled) Gait Pattern/deviations: Step-through pattern;Decreased stride length;Trunk flexed Gait velocity: Decreased Gait velocity interpretation: <1.8 ft/sec, indicate of risk for recurrent falls General Gait Details: VC's for improved posture and general safety with RW. Pt reports she feels shakey and does not want to attempt without the RW however does not plan to use the walker at home. Recommended RW use initially upon d/c.   Stairs            Wheelchair Mobility    Modified Rankin (Stroke Patients Only)       Balance Overall balance assessment: No apparent balance deficits (not formally assessed)                                           Pertinent Vitals/Pain Pain Assessment: Faces Pain Score: 8  Faces Pain Scale: Hurts little more Pain Location: Back' Pain Descriptors / Indicators: Constant;Discomfort Pain Intervention(s): Limited activity within patient's tolerance;Monitored during session;Repositioned    Home Living Family/patient expects to be discharged to:: Private residence Living Arrangements: Spouse/significant other Available Help at Discharge: Family;Available 24 hours/day Type of Home: House Home Access: Stairs to enter Entrance Stairs-Rails: None Entrance Stairs-Number of Steps: 2 Home Layout: Two level Home Equipment: Walker - 2 wheels;Cane - single point;Grab bars - tub/shower;Walker - 4 wheels;Adaptive equipment;Bedside commode;Shower seat;Tub bench Additional Comments: Pt reports she doesn't used  AD right now, spouse is retired and can be [employer].    Prior Function Level of Independence: Independent with assistive device(s)         Comments: Reports she was using RW recently with pain     Hand  Dominance   Dominant Hand: Right    Extremity/Trunk Assessment   Upper Extremity Assessment Upper Extremity Assessment: Defer to OT evaluation    Lower Extremity Assessment Lower Extremity Assessment: Generalized weakness(Consistent with pre-op diagnosis)    Cervical / Trunk Assessment Cervical / Trunk Assessment: Other exceptions Cervical / Trunk Exceptions: s/p back sx  Communication   Communication: No difficulties  Cognition Arousal/Alertness: Awake/alert Behavior During Therapy: Anxious;Restless Overall Cognitive Status: Within Functional Limits for tasks assessed                                 General Comments: Pt very fidgety and requiring calm cues for anxiety.      General Comments General comments (skin integrity, edema, etc.): VSS. While OT goign to get AE for session, pt accidently pulling IV from hand while sitting at EOB. Upon return, pt bleeding from IV site. Notified RN who pulled IV.    Exercises     Assessment/Plan    PT Assessment Patient needs continued PT services  PT Problem List Decreased strength;Decreased activity tolerance;Decreased balance;Decreased mobility;Decreased knowledge of use of DME;Decreased safety awareness;Decreased knowledge of precautions;Pain       PT Treatment Interventions DME instruction;Gait training;Functional mobility training;Stair training;Therapeutic activities;Therapeutic exercise;Neuromuscular re-education;Patient/family education    PT Goals (Current goals can be found in the Care Plan section)  Acute Rehab PT Goals Patient Stated Goal: "Go home" PT Goal Formulation: With patient Potential to Achieve Goals: Good    Frequency Min 5X/week   Barriers to discharge        Co-evaluation               AM-PAC PT "6 Clicks" Mobility  Outcome Measure Help needed turning from your back to your side while in a flat bed without using bedrails?: None Help needed moving from lying on your back to  sitting on the side of a flat bed without using bedrails?: None Help needed moving to and from a bed to a chair (including a wheelchair)?: A Little Help needed standing up from a chair using your arms (e.g., wheelchair or bedside chair)?: A Little Help needed to walk in hospital room?: A Little Help needed climbing 3-5 steps with a railing? : A Little 6 Click Score: 20    End of Session Equipment Utilized During Treatment: Gait belt;Back brace Activity Tolerance: Patient tolerated treatment well Patient left: in chair;with call bell/phone within reach Nurse Communication: Mobility status PT Visit Diagnosis: Unsteadiness on feet (R26.81);Pain Pain - part of body: (back)    Time: [id number] PT Time Calculation (min) (ACUTE ONLY): 19 min   Charges:   PT Evaluation $PT Eval Moderate Complexity: 1 Mod          Rolinda Roan, PT, DPT Acute Rehabilitation Services Pager: [phone] Office: [phone]   Thelma Comp 05/31/2019, 2:53 PM

## 2019-06-12 ENCOUNTER — Telehealth: Payer: Self-pay

## 2019-06-12 DIAGNOSIS — G2581 Restless legs syndrome: Secondary | ICD-10-CM

## 2019-06-12 MED ORDER — ROPINIROLE HCL 0.5 MG PO TABS: 0.7500 mg | ORAL_TABLET | Freq: Every day | ORAL | 1 refills | Status: DC

## 2019-06-12 NOTE — Telephone Encounter (Signed)
Done

## 2019-06-12 NOTE — Telephone Encounter (Signed)
Please advise if you are ok with this.

## 2019-06-12 NOTE — Telephone Encounter (Signed)
I am ok with this. Thanks.

## 2019-06-12 NOTE — Telephone Encounter (Signed)
Pt. Called LM and for a refill on her requip said that Dr. Posey Pronto used to fill it but she does not see Dr. Posey Pronto anymore she said Vickie originally filled this for her and wanted to know if she could refill it again for her to the [facility] on [address].

## 2019-06-14 ENCOUNTER — Other Ambulatory Visit: Payer: Self-pay | Admitting: Family Medicine

## 2019-06-24 ENCOUNTER — Ambulatory Visit (INDEPENDENT_AMBULATORY_CARE_PROVIDER_SITE_OTHER): Payer: PPO | Admitting: Medical

## 2019-06-24 ENCOUNTER — Emergency Department (HOSPITAL_COMMUNITY): Payer: PPO

## 2019-06-24 ENCOUNTER — Inpatient Hospital Stay (HOSPITAL_COMMUNITY)
Admission: EM | Admit: 2019-06-24 | Discharge: 2019-06-28 | DRG: 683 | Disposition: A | Discharge status: Rehabilitation Facility | Payer: PPO | Attending: Internal Medicine | Admitting: Internal Medicine

## 2019-06-24 ENCOUNTER — Other Ambulatory Visit: Payer: Self-pay

## 2019-06-24 ENCOUNTER — Encounter (HOSPITAL_COMMUNITY): Payer: Self-pay | Admitting: Family Medicine

## 2019-06-24 VITALS — BP 150/60 | HR 88 | Temp 96.0°F

## 2019-06-24 DIAGNOSIS — Z9889 Other specified postprocedural states: Secondary | ICD-10-CM | POA: Diagnosis not present

## 2019-06-24 DIAGNOSIS — R112 Nausea with vomiting, unspecified: Secondary | ICD-10-CM | POA: Insufficient documentation

## 2019-06-24 DIAGNOSIS — N183 Chronic kidney disease, stage 3 unspecified: Secondary | ICD-10-CM | POA: Diagnosis present

## 2019-06-24 DIAGNOSIS — Z6838 Body mass index (BMI) 38.0-38.9, adult: Secondary | ICD-10-CM

## 2019-06-24 DIAGNOSIS — R2689 Other abnormalities of gait and mobility: Secondary | ICD-10-CM | POA: Diagnosis not present

## 2019-06-24 DIAGNOSIS — D539 Nutritional anemia, unspecified: Secondary | ICD-10-CM | POA: Diagnosis not present

## 2019-06-24 DIAGNOSIS — R110 Nausea: Secondary | ICD-10-CM | POA: Diagnosis not present

## 2019-06-24 DIAGNOSIS — Z87891 Personal history of nicotine dependence: Secondary | ICD-10-CM

## 2019-06-24 DIAGNOSIS — M109 Gout, unspecified: Secondary | ICD-10-CM | POA: Diagnosis present

## 2019-06-24 DIAGNOSIS — Z20822 Contact with and (suspected) exposure to covid-19: Secondary | ICD-10-CM | POA: Diagnosis present

## 2019-06-24 DIAGNOSIS — F419 Anxiety disorder, unspecified: Secondary | ICD-10-CM | POA: Diagnosis not present

## 2019-06-24 DIAGNOSIS — E875 Hyperkalemia: Secondary | ICD-10-CM | POA: Diagnosis not present

## 2019-06-24 DIAGNOSIS — I5189 Other ill-defined heart diseases: Secondary | ICD-10-CM | POA: Diagnosis present

## 2019-06-24 DIAGNOSIS — M62830 Muscle spasm of back: Secondary | ICD-10-CM | POA: Diagnosis not present

## 2019-06-24 DIAGNOSIS — M62838 Other muscle spasm: Secondary | ICD-10-CM | POA: Diagnosis not present

## 2019-06-24 DIAGNOSIS — N182 Chronic kidney disease, stage 2 (mild): Secondary | ICD-10-CM | POA: Diagnosis not present

## 2019-06-24 DIAGNOSIS — Z79899 Other long term (current) drug therapy: Secondary | ICD-10-CM

## 2019-06-24 DIAGNOSIS — I130 Hypertensive heart and chronic kidney disease with heart failure and stage 1 through stage 4 chronic kidney disease, or unspecified chronic kidney disease: Secondary | ICD-10-CM | POA: Diagnosis not present

## 2019-06-24 DIAGNOSIS — Z91048 Other nonmedicinal substance allergy status: Secondary | ICD-10-CM

## 2019-06-24 DIAGNOSIS — R0989 Other specified symptoms and signs involving the circulatory and respiratory systems: Secondary | ICD-10-CM | POA: Diagnosis not present

## 2019-06-24 DIAGNOSIS — T380X5A Adverse effect of glucocorticoids and synthetic analogues, initial encounter: Secondary | ICD-10-CM | POA: Diagnosis not present

## 2019-06-24 DIAGNOSIS — N390 Urinary tract infection, site not specified: Secondary | ICD-10-CM | POA: Diagnosis not present

## 2019-06-24 DIAGNOSIS — R29898 Other symptoms and signs involving the musculoskeletal system: Secondary | ICD-10-CM

## 2019-06-24 DIAGNOSIS — Z961 Presence of intraocular lens: Secondary | ICD-10-CM | POA: Diagnosis present

## 2019-06-24 DIAGNOSIS — Z720 Tobacco use: Secondary | ICD-10-CM | POA: Diagnosis not present

## 2019-06-24 DIAGNOSIS — Z8619 Personal history of other infectious and parasitic diseases: Secondary | ICD-10-CM

## 2019-06-24 DIAGNOSIS — E8809 Other disorders of plasma-protein metabolism, not elsewhere classified: Secondary | ICD-10-CM | POA: Diagnosis not present

## 2019-06-24 DIAGNOSIS — R7303 Prediabetes: Secondary | ICD-10-CM | POA: Diagnosis not present

## 2019-06-24 DIAGNOSIS — Z880 Allergy status to penicillin: Secondary | ICD-10-CM | POA: Diagnosis not present

## 2019-06-24 DIAGNOSIS — E876 Hypokalemia: Secondary | ICD-10-CM | POA: Diagnosis not present

## 2019-06-24 DIAGNOSIS — R739 Hyperglycemia, unspecified: Secondary | ICD-10-CM | POA: Diagnosis not present

## 2019-06-24 DIAGNOSIS — Z803 Family history of malignant neoplasm of breast: Secondary | ICD-10-CM

## 2019-06-24 DIAGNOSIS — F320 Major depressive disorder, single episode, mild: Secondary | ICD-10-CM | POA: Diagnosis not present

## 2019-06-24 DIAGNOSIS — E871 Hypo-osmolality and hyponatremia: Secondary | ICD-10-CM | POA: Diagnosis present

## 2019-06-24 DIAGNOSIS — J449 Chronic obstructive pulmonary disease, unspecified: Secondary | ICD-10-CM | POA: Diagnosis present

## 2019-06-24 DIAGNOSIS — E038 Other specified hypothyroidism: Secondary | ICD-10-CM | POA: Diagnosis not present

## 2019-06-24 DIAGNOSIS — K219 Gastro-esophageal reflux disease without esophagitis: Secondary | ICD-10-CM | POA: Diagnosis not present

## 2019-06-24 DIAGNOSIS — Z85828 Personal history of other malignant neoplasm of skin: Secondary | ICD-10-CM

## 2019-06-24 DIAGNOSIS — K5909 Other constipation: Secondary | ICD-10-CM | POA: Diagnosis not present

## 2019-06-24 DIAGNOSIS — I5032 Chronic diastolic (congestive) heart failure: Secondary | ICD-10-CM | POA: Diagnosis not present

## 2019-06-24 DIAGNOSIS — Z923 Personal history of irradiation: Secondary | ICD-10-CM

## 2019-06-24 DIAGNOSIS — Z881 Allergy status to other antibiotic agents status: Secondary | ICD-10-CM

## 2019-06-24 DIAGNOSIS — Z8719 Personal history of other diseases of the digestive system: Secondary | ICD-10-CM | POA: Diagnosis not present

## 2019-06-24 DIAGNOSIS — F329 Major depressive disorder, single episode, unspecified: Secondary | ICD-10-CM | POA: Diagnosis not present

## 2019-06-24 DIAGNOSIS — R296 Repeated falls: Secondary | ICD-10-CM | POA: Diagnosis not present

## 2019-06-24 DIAGNOSIS — N179 Acute kidney failure, unspecified: Principal | ICD-10-CM | POA: Diagnosis present

## 2019-06-24 DIAGNOSIS — E46 Unspecified protein-calorie malnutrition: Secondary | ICD-10-CM | POA: Diagnosis not present

## 2019-06-24 DIAGNOSIS — E440 Moderate protein-calorie malnutrition: Secondary | ICD-10-CM | POA: Diagnosis not present

## 2019-06-24 DIAGNOSIS — E785 Hyperlipidemia, unspecified: Secondary | ICD-10-CM | POA: Diagnosis not present

## 2019-06-24 DIAGNOSIS — E669 Obesity, unspecified: Secondary | ICD-10-CM | POA: Diagnosis not present

## 2019-06-24 DIAGNOSIS — M545 Low back pain, unspecified: Secondary | ICD-10-CM

## 2019-06-24 DIAGNOSIS — F1010 Alcohol abuse, uncomplicated: Secondary | ICD-10-CM | POA: Diagnosis not present

## 2019-06-24 DIAGNOSIS — J431 Panlobular emphysema: Secondary | ICD-10-CM | POA: Diagnosis not present

## 2019-06-24 DIAGNOSIS — K5730 Diverticulosis of large intestine without perforation or abscess without bleeding: Secondary | ICD-10-CM | POA: Diagnosis not present

## 2019-06-24 DIAGNOSIS — G894 Chronic pain syndrome: Secondary | ICD-10-CM | POA: Diagnosis not present

## 2019-06-24 DIAGNOSIS — I129 Hypertensive chronic kidney disease with stage 1 through stage 4 chronic kidney disease, or unspecified chronic kidney disease: Secondary | ICD-10-CM | POA: Diagnosis not present

## 2019-06-24 DIAGNOSIS — M48061 Spinal stenosis, lumbar region without neurogenic claudication: Secondary | ICD-10-CM | POA: Diagnosis present

## 2019-06-24 DIAGNOSIS — N1832 Chronic kidney disease, stage 3b: Secondary | ICD-10-CM | POA: Diagnosis not present

## 2019-06-24 DIAGNOSIS — G8918 Other acute postprocedural pain: Secondary | ICD-10-CM | POA: Diagnosis not present

## 2019-06-24 DIAGNOSIS — Z885 Allergy status to narcotic agent status: Secondary | ICD-10-CM | POA: Diagnosis not present

## 2019-06-24 DIAGNOSIS — G2581 Restless legs syndrome: Secondary | ICD-10-CM | POA: Diagnosis present

## 2019-06-24 DIAGNOSIS — E782 Mixed hyperlipidemia: Secondary | ICD-10-CM | POA: Diagnosis not present

## 2019-06-24 DIAGNOSIS — I10 Essential (primary) hypertension: Secondary | ICD-10-CM | POA: Diagnosis not present

## 2019-06-24 DIAGNOSIS — Z888 Allergy status to other drugs, medicaments and biological substances status: Secondary | ICD-10-CM | POA: Diagnosis not present

## 2019-06-24 DIAGNOSIS — R109 Unspecified abdominal pain: Secondary | ICD-10-CM | POA: Insufficient documentation

## 2019-06-24 DIAGNOSIS — M6281 Muscle weakness (generalized): Secondary | ICD-10-CM | POA: Diagnosis not present

## 2019-06-24 DIAGNOSIS — G4700 Insomnia, unspecified: Secondary | ICD-10-CM | POA: Diagnosis not present

## 2019-06-24 DIAGNOSIS — I519 Heart disease, unspecified: Secondary | ICD-10-CM | POA: Diagnosis present

## 2019-06-24 DIAGNOSIS — G8929 Other chronic pain: Secondary | ICD-10-CM | POA: Diagnosis present

## 2019-06-24 DIAGNOSIS — Z86010 Personal history of colonic polyps: Secondary | ICD-10-CM

## 2019-06-24 DIAGNOSIS — D649 Anemia, unspecified: Secondary | ICD-10-CM

## 2019-06-24 DIAGNOSIS — Z9842 Cataract extraction status, left eye: Secondary | ICD-10-CM

## 2019-06-24 DIAGNOSIS — K567 Ileus, unspecified: Secondary | ICD-10-CM | POA: Diagnosis not present

## 2019-06-24 DIAGNOSIS — Z853 Personal history of malignant neoplasm of breast: Secondary | ICD-10-CM

## 2019-06-24 DIAGNOSIS — T84226A Displacement of internal fixation device of vertebrae, initial encounter: Secondary | ICD-10-CM | POA: Diagnosis not present

## 2019-06-24 DIAGNOSIS — M4626 Osteomyelitis of vertebra, lumbar region: Secondary | ICD-10-CM | POA: Diagnosis not present

## 2019-06-24 DIAGNOSIS — E039 Hypothyroidism, unspecified: Secondary | ICD-10-CM | POA: Diagnosis not present

## 2019-06-24 DIAGNOSIS — L02212 Cutaneous abscess of back [any part, except buttock]: Secondary | ICD-10-CM | POA: Diagnosis not present

## 2019-06-24 DIAGNOSIS — M48062 Spinal stenosis, lumbar region with neurogenic claudication: Secondary | ICD-10-CM | POA: Diagnosis not present

## 2019-06-24 DIAGNOSIS — Z981 Arthrodesis status: Secondary | ICD-10-CM

## 2019-06-24 DIAGNOSIS — C50212 Malignant neoplasm of upper-inner quadrant of left female breast: Secondary | ICD-10-CM | POA: Diagnosis not present

## 2019-06-24 DIAGNOSIS — R8290 Unspecified abnormal findings in urine: Secondary | ICD-10-CM | POA: Diagnosis not present

## 2019-06-24 DIAGNOSIS — Z03818 Encounter for observation for suspected exposure to other biological agents ruled out: Secondary | ICD-10-CM | POA: Diagnosis not present

## 2019-06-24 DIAGNOSIS — R251 Tremor, unspecified: Secondary | ICD-10-CM | POA: Diagnosis not present

## 2019-06-24 DIAGNOSIS — Z79890 Hormone replacement therapy (postmenopausal): Secondary | ICD-10-CM

## 2019-06-24 DIAGNOSIS — M5416 Radiculopathy, lumbar region: Secondary | ICD-10-CM | POA: Diagnosis not present

## 2019-06-24 DIAGNOSIS — B370 Candidal stomatitis: Secondary | ICD-10-CM | POA: Diagnosis not present

## 2019-06-24 DIAGNOSIS — T8149XA Infection following a procedure, other surgical site, initial encounter: Secondary | ICD-10-CM | POA: Diagnosis not present

## 2019-06-24 DIAGNOSIS — D62 Acute posthemorrhagic anemia: Secondary | ICD-10-CM | POA: Diagnosis not present

## 2019-06-24 DIAGNOSIS — Z79891 Long term (current) use of opiate analgesic: Secondary | ICD-10-CM

## 2019-06-24 DIAGNOSIS — F411 Generalized anxiety disorder: Secondary | ICD-10-CM | POA: Diagnosis not present

## 2019-06-24 DIAGNOSIS — D631 Anemia in chronic kidney disease: Secondary | ICD-10-CM | POA: Diagnosis not present

## 2019-06-24 HISTORY — DX: Chronic kidney disease, unspecified: N18.9

## 2019-06-24 LAB — POCT URINALYSIS DIP (PROADVANTAGE DEVICE)
Blood, UA: NEGATIVE
Glucose, UA: NEGATIVE mg/dL
Ketones, POC UA: NEGATIVE mg/dL
Nitrite, UA: NEGATIVE
Specific Gravity, Urine: 1.025
Urobilinogen, Ur: NEGATIVE
pH, UA: 5.5 (ref 5.0–8.0)

## 2019-06-24 LAB — COMPREHENSIVE METABOLIC PANEL
ALT: 45 U/L — ABNORMAL HIGH (ref 0–44)
AST: 39 U/L (ref 15–41)
Albumin: 3.3 g/dL — ABNORMAL LOW (ref 3.5–5.0)
Alkaline Phosphatase: 104 U/L (ref 38–126)
Anion gap: 12 (ref 5–15)
BUN: 70 mg/dL — ABNORMAL HIGH (ref 8–23)
CO2: 24 mmol/L (ref 22–32)
Calcium: 9.8 mg/dL (ref 8.9–10.3)
Chloride: 97 mmol/L — ABNORMAL LOW (ref 98–111)
Creatinine, Ser: 2.83 mg/dL — ABNORMAL HIGH (ref 0.44–1.00)
GFR calc Af Amer: 19 mL/min — ABNORMAL LOW (ref >60)
GFR calc non Af Amer: 16 mL/min — ABNORMAL LOW (ref >60)
Glucose, Bld: 132 mg/dL — ABNORMAL HIGH (ref 70–99)
Potassium: 4.6 mmol/L (ref 3.5–5.1)
Sodium: 133 mmol/L — ABNORMAL LOW (ref 135–145)
Total Bilirubin: 0.8 mg/dL (ref 0.3–1.2)
Total Protein: 7.5 g/dL (ref 6.5–8.1)

## 2019-06-24 LAB — CBC WITH DIFFERENTIAL/PLATELET
Basophils Absolute: 0 10*3/uL (ref 0.0–0.2)
Basos: 0 %
EOS (ABSOLUTE): 0 10*3/uL (ref 0.0–0.4)
Eos: 0 %
Hematocrit: 30.1 % — ABNORMAL LOW (ref 34.0–46.6)
Hemoglobin: 9.7 g/dL — ABNORMAL LOW (ref 11.1–15.9)
Lymphocytes Absolute: 1.7 10*3/uL (ref 0.7–3.1)
Lymphs: 13 %
MCH: 31.2 pg (ref 26.6–33.0)
MCHC: 32.2 g/dL (ref 31.5–35.7)
MCV: 97 fL (ref 79–97)
Monocytes Absolute: 1 10*3/uL — ABNORMAL HIGH (ref 0.1–0.9)
Monocytes: 8 %
Neutrophils Absolute: 10 10*3/uL — ABNORMAL HIGH (ref 1.4–7.0)
Neutrophils: 79 %
Platelets: 372 10*3/uL (ref 150–450)
RBC: 3.11 x10E6/uL — ABNORMAL LOW (ref 3.77–5.28)
RDW: 14.5 % (ref 11.7–15.4)
WBC: 12.7 10*3/uL — ABNORMAL HIGH (ref 3.4–10.8)

## 2019-06-24 LAB — CBC
HCT: 31.6 % — ABNORMAL LOW (ref 36.0–46.0)
Hemoglobin: 9.7 g/dL — ABNORMAL LOW (ref 12.0–15.0)
MCH: 31.5 pg (ref 26.0–34.0)
MCHC: 30.7 g/dL (ref 30.0–36.0)
MCV: 102.6 fL — ABNORMAL HIGH (ref 80.0–100.0)
Platelets: 342 10*3/uL (ref 150–400)
RBC: 3.08 MIL/uL — ABNORMAL LOW (ref 3.87–5.11)
RDW: 14.4 % (ref 11.5–15.5)
WBC: 12.2 10*3/uL — ABNORMAL HIGH (ref 4.0–10.5)
nRBC: 0 % (ref 0.0–0.2)

## 2019-06-24 LAB — BASIC METABOLIC PANEL
BUN/Creatinine Ratio: 22 (ref 12–28)
BUN: 68 mg/dL — ABNORMAL HIGH (ref 8–27)
CO2: 24 mmol/L (ref 20–29)
Calcium: 10.3 mg/dL (ref 8.7–10.3)
Chloride: 99 mmol/L (ref 96–106)
Creatinine, Ser: 3.11 mg/dL — ABNORMAL HIGH (ref 0.57–1.00)
GFR calc Af Amer: 16 mL/min/{1.73_m2} — ABNORMAL LOW (ref >59)
GFR calc non Af Amer: 14 mL/min/{1.73_m2} — ABNORMAL LOW (ref >59)
Glucose: 148 mg/dL — ABNORMAL HIGH (ref 65–99)
Potassium: 5.2 mmol/L (ref 3.5–5.2)
Sodium: 132 mmol/L — ABNORMAL LOW (ref 134–144)

## 2019-06-24 MED ORDER — SODIUM CHLORIDE 0.9 % IV BOLUS
1000.0000 mL | Freq: Once | INTRAVENOUS | Status: AC
  Administered 2019-06-24: 1000 mL via INTRAVENOUS

## 2019-06-24 MED ORDER — ONDANSETRON 4 MG PO TBDP: 4.0000 mg | ORAL_TABLET | Freq: Three times a day (TID) | ORAL | 0 refills | Status: DC | PRN

## 2019-06-24 NOTE — ED Triage Notes (Signed)
Patient was seen today by Chana Bode, PA for atypical back pain in her area of her kidneys. She recently had back surgery on 05/30/19, but this pain is different. Today, she had labs drawn and urinalysis performed. Patient was notified that her Creatinine is 3.11 with a BUN of 68.

## 2019-06-24 NOTE — ED Provider Notes (Signed)
11:39 PM Patient arrived in transfer from [facility] for MRI and admission.  See prior notes for full H&P.  Briefly, 74 year old female status post L2 laminectomy with facetotomy and decompression, placement of expandable cages x2, bone autograft placement, presenting to the ED with worsening left leg weakness and abnormal labs with findings of ARF.  MRI unavailable at [facility] at this hour, so sent here for MRI and admission.  Neurosurgery has been made aware.  Plan:  MRI, hospital admission.  Results for orders placed or performed during the hospital encounter of 06/24/19  CBC  Result Value Ref Range   WBC 12.2 (H) 4.0 - 10.5 K/uL   RBC 3.08 (L) 3.87 - 5.11 MIL/uL   Hemoglobin 9.7 (L) 12.0 - 15.0 g/dL   HCT 31.6 (L) 36.0 - 46.0 %   MCV 102.6 (H) 80.0 - 100.0 fL   MCH 31.5 26.0 - 34.0 pg   MCHC 30.7 30.0 - 36.0 g/dL   RDW 14.4 11.5 - 15.5 %   Platelets 342 150 - 400 K/uL   nRBC 0.0 0.0 - 0.2 %  Comprehensive metabolic panel  Result Value Ref Range   Sodium 133 (L) 135 - 145 mmol/L   Potassium 4.6 3.5 - 5.1 mmol/L   Chloride 97 (L) 98 - 111 mmol/L   CO2 24 22 - 32 mmol/L   Glucose, Bld 132 (H) 70 - 99 mg/dL   BUN 70 (H) 8 - 23 mg/dL   Creatinine, Ser 2.83 (H) 0.44 - 1.00 mg/dL   Calcium 9.8 8.9 - 10.3 mg/dL   Total Protein 7.5 6.5 - 8.1 g/dL   Albumin 3.3 (L) 3.5 - 5.0 g/dL   AST 39 15 - 41 U/L   ALT 45 (H) 0 - 44 U/L   Alkaline Phosphatase 104 38 - 126 U/L   Total Bilirubin 0.8 0.3 - 1.2 mg/dL   GFR calc non Af Amer 16 (L) >60 mL/min   GFR calc Af Amer 19 (L) >60 mL/min   Anion gap 12 5 - 15   DG Lumbar Spine 2-3 Views  Result Date: 05/30/2019 CLINICAL DATA:  L2-3 PLIF. Prior L3 through S1 fusion. EXAM: OPERATIVE LUMBAR SPINE 2 VIEW(S) COMPARISON:  MRI lumbar spine 03/31/2019. FINDINGS: Two spot images from the C-arm fluoroscopic device, AP and LATERAL views of the lumbar spine are submitted for interpretation post-operatively. The patient has undergone additional L2-3 fusion and now has  hardware spanning L2 through S1 and disc prostheses at every level from L2-3 through L5-S1. The radiologic technologist documented 20 seconds of fluoroscopy time. IMPRESSION: L2-3 fusion, with hardware now spanning L2 through S1 and disc prostheses at every level from L2-3 through L5-S1. Electronically Signed   By: Evangeline Dakin M.D.   On: 05/30/2019 16:49   MR LUMBAR SPINE WO CONTRAST  Result Date: 06/25/2019 CLINICAL DATA:  Initial evaluation for acute left leg weakness. Recent surgery on 05/30/2019. EXAM: MRI LUMBAR SPINE WITHOUT CONTRAST TECHNIQUE: Multiplanar, multisequence MR imaging of the lumbar spine was performed. No intravenous contrast was administered. COMPARISON:  Previous MRI from 03/31/2019. FINDINGS: Segmentation: Standard. Lowest well-formed disc space labeled the L5-S1 level. Alignment: Trace retrolisthesis of L1 on L2. Alignment otherwise normal with preservation of the normal lumbar lordosis. Vertebrae: Postoperative changes from recent extension of posterior decompression and fusion to the L2-3 level, with sequelae of prior PLIF now seen at this level. There is prominent marrow edema within the L2 and L3 vertebral bodies. Edema extends into the adjacent psoas musculature bilaterally, left  greater than right (series 10, image 16). While these findings may be postoperative in nature, appearance raises the concern for possible infection/osteomyelitis. There is question of a circumferential epidural collection at the level of L2-3 (series 10, image 15), difficult to see given adjacent susceptibility artifact. Compression of the thecal sac with persistent fairly severe spinal stenosis. Slight inferior migration of epidural collection within the ventral epidural space to the level of L3 (series 10, image 19). Elsewhere, the remainder of the fusion extending from L3 through S1 is stable in appearance. Vertebral body height maintained without acute or interval fracture. No discrete osseous  lesions. No other abnormal marrow edema. Conus medullaris and cauda equina: Conus extends to the superior aspect of L1. Partially visualized conus and distal cord normal in appearance. Proximal nerve roots of the cauda equina somewhat irregular and undulating superior to the L2-3 level secondary to stenosis. Paraspinal and other soft tissues: Postoperative changes from recent posterior decompression with fusion at L2-3. Diffuse edema seen throughout the posterior paraspinous musculature. There is a residual collection along the mid posterior soft tissues extending from approximately L3-4 through L5-S1. This measures approximately 5.0 x 4.7 x 7.4 cm (series 10, image 29). Collection is heterogeneous with a few scattered internal fluid-fluid levels, which could reflect complex postoperative seroma or hematoma. Superimposed infection not excluded. Additional smaller more simple appearing collection within the subcutaneous fat along the midline incision measures 2.0 x 1.6 x 4.3 cm (series 10, image 28). Edema within the left greater than right psoas musculature adjacent to the L2-3 interspace as above. No definite loculated collection within either psoas muscle, although evaluation limited given lack of IV contrast. Disc levels: T12-L1: Unremarkable. L1-2: Mild diffuse disc bulge with disc desiccation. Disc bulging eccentric to the left. Bilateral facet hypertrophy. Progressive mild spinal stenosis. Mild left L1 foraminal narrowing. No significant right foraminal stenosis. L2-3: Postoperative changes from interval decompressive laminectomy with facetectomy with posterior and interbody fusion. Suspected circumferential epidural collection compresses the thecal sac. Persistent severe spinal stenosis, with the thecal sac measuring approximately 7 mm in AP I diameter at its most narrow point. Foramina are grossly patent. L3-4: Prior PLIF. Mild spinal stenosis related to the posterior soft tissue collection. Foramina remain  grossly patent. L4-5: Prior PLIF. Mild to moderate spinal stenosis related to the posterior soft tissue collection. Foramina remain patent. L5-S1: Prior PLIF. Thecal sac patency grossly stable. No new foraminal narrowing. IMPRESSION: 1. Postoperative changes from recent extension of posterior decompression and fusion to the L2-3 level. Prominent marrow edema within the L2 and L3 vertebral bodies with soft tissue edema within the adjacent psoas musculature as above. While these findings may in part be postoperative in nature, appearance raises the concern for possible infection/osteomyelitis. Correlation with symptomatology and laboratory values recommended. Additionally, follow-up examination with postcontrast imaging may be helpful for further evaluation as warranted. 2. Persistent severe stenosis at the level of L2-3 with question circumferential epidural collection as above. Finding difficult to be certain given adjacent susceptibility artifact. Again, postcontrast imaging may be helpful. Additionally, correlation with dedicated CT with contrast could also be performed as well. 3. Approximate 5.0 x 4.7 x 7.4 cm heterogeneous collection within the posterior paraspinous soft tissues extending from L3 through L5-S1. Differential considerations include a postoperative hematoma versus complicated seroma. Superimposed infection could be considered in the correct clinical setting. Electronically Signed   By: Jeannine Boga M.D.   On: 06/25/2019 02:56   DG C-Arm 1-60 Min  Result Date: 05/30/2019 CLINICAL DATA:  L2-3 PLIF. Prior L3 through S1 fusion. EXAM: OPERATIVE LUMBAR SPINE 2 VIEW(S) COMPARISON:  MRI lumbar spine 03/31/2019. FINDINGS: Two spot images from the C-arm fluoroscopic device, AP and LATERAL views of the lumbar spine are submitted for interpretation post-operatively. The patient has undergone additional L2-3 fusion and now has hardware spanning L2 through S1 and disc prostheses at every level from  L2-3 through L5-S1. The radiologic technologist documented 20 seconds of fluoroscopy time. IMPRESSION: L2-3 fusion, with hardware now spanning L2 through S1 and disc prostheses at every level from L2-3 through L5-S1. Electronically Signed   By: Evangeline Dakin M.D.   On: 05/30/2019 16:49   CT RENAL STONE STUDY  Result Date: 06/24/2019 CLINICAL DATA:  Atypical back pain EXAM: CT ABDOMEN AND PELVIS WITHOUT CONTRAST TECHNIQUE: Multidetector CT imaging of the abdomen and pelvis was performed following the standard protocol without IV contrast. COMPARISON:  CT 02/20/2018 FINDINGS: Lower chest: Lung bases demonstrate no acute consolidation or effusion. Heart size within normal limits. Hepatobiliary: No focal liver abnormality is seen. No gallstones, gallbladder wall thickening, or biliary dilatation. Pancreas: Unremarkable. No pancreatic ductal dilatation or surrounding inflammatory changes. Spleen: Normal in size without focal abnormality. Adrenals/Urinary Tract: Adrenal glands are normal. Kidneys show no hydronephrosis. Probable cyst within the mid to lower left kidney. No ureteral stone is seen. The bladder is unremarkable. Stomach/Bowel: Stomach is nonenlarged. No dilated small bowel. Postsurgical changes of the right colon. No bowel wall thickening. Scattered diverticular disease without acute inflammatory process. Vascular/Lymphatic: Extensive aortic atherosclerosis. No aneurysm. No significant adenopathy. Reproductive: Status post hysterectomy. No adnexal masses. Other: Negative for free air or free fluid. Musculoskeletal: Posterior fusion changes L2 through S1 with interbody devices at L2-L3, L3-L4, L4-L5 and L5-S1. Breach of the superior endplate by fixating screws on both sides at L2 with some lucency surrounding the screws. IMPRESSION: 1. Negative for hydronephrosis or ureteral stone. 2. Diverticular disease of the colon without acute inflammatory process 3. Post fusion changes of the lumbar spine L2  through S1. Fixating screws at L2 breech the superior endplate on both sides and there appears to be some lucency surrounding the fixating screws. Hardware otherwise appears intact. Electronically Signed   By: Donavan Foil M.D.   On: 06/24/2019 22:44    3:30 AM Discussed MRI results with on call neurosurgery, PA Kim-- Dr. Kathyrn Sheriff will see patient his AM.  Recommends to hold on abx for now.  Will obtain blood cultures, lactate, ESR, CRP. Agrees with hospitalist admission to address renal function.  Patient has been updated, she is agreeable.  Discussed with hospitalist, Dr. Myna Hidalgo-- will admit for ongoing care.   [name], PA-C 06/25/19 [place], Delice Bison, DO 06/25/19 [phone]

## 2019-06-24 NOTE — Progress Notes (Signed)
Subjective: Chief Complaint  Patient presents with  . Abdominal Pain    needs urine checked    Here with her daughter today.  Had recent back surgery 05/30/19.  Had another back surgery 06/2018.  Having pains.   Saw neurosurgereon in follow up today but having some pains that are different that her typical back pain.  Having pain in kidney area.  Had some lower belly pain that radiated around to the back.  Urine smells bad, but no discomfit with urination.  Since surgery gradually improved and had about 2 weeks of improvement then these new symptoms began.  Daughter notes she is having some confusion at times.  Worried about her having sepsis as she is having early signs like she had back in October.  Had pneumonia in October 2020.     No fever, no vomiting.  She notes fatigued.  Has had some nausea, but gets nausea with pain in general.  Urine is some cloudy, no blood in urine.  Gets blood in stool occasionally.  Can have blood on paper and in bowel.  Usually attribute to hemorrhoids, but has hx/o colectomy and numerous prior polyps. Has had some recent loose stools.  Drinking a lot of water.   Has had some inconsistency with bowels since surgery.  Is using stool softener and magnesium which is helping.   Has hx/o hysterectomy but still has ovaries.    Last urinary tract infection is not recently.    She notes hx/o tobacco use in the past, quit several years ago.  She notes baseline oxygen is 92-94% at home, she notes COPD reported as diagnosis before but not using inhalers regularly.  Past Medical History:  Diagnosis Date  . AIN (anal intraepithelial neoplasia) anal canal   . Anemia    with pregnancy  . Anxiety   . Arthritis    knees, lower back, knees  . Chronic constipation   . Coarse tremors    Essential  . COPD (chronic obstructive pulmonary disease) ([place])    2017 chest CT, history of  . Degenerative lumbar spinal stenosis   . Depression   . Drug dependency ([place])   . Elevated  PTHrP level 05/09/2019  . GERD (gastroesophageal reflux disease)    pt denies  . Gout   . Grade I diastolic dysfunction 0000000   Noted ECHO  . Hemorrhoids   . History of adenomatous polyp of colon   . History of basal cell carcinoma (BCC) excision 2015   nose  . History of sepsis 05/14/2014   post lumbar surgery  . History of shingles    x2  . Hyperlipidemia   . Hypertension   . Hypothyroidism   . Irregular heart beat    history of while undergoing radiation  . Malignant neoplasm of upper-inner quadrant of left breast in female, estrogen receptor positive [facility]) oncologist-  dr Jana Hakim--  per lov in epic no recurrence   dx 07-13-2015  Left breast invasive ductal carcinoma, Stage IA, Grade 1 (TXN0),  09-16-2015  s/p  left breast lumpectomy with sln bx's,  completed radiation 11-18-2015,  started antiestrogen therapy 12-14-2015  . Neuropathy    Left foot and back of left leg  . Personal history of radiation therapy    completed 11-18-2015  left breast  . Pneumonia   . PONV (postoperative nausea and vomiting)   . Prediabetes    diet controlled, no med  . Restless leg syndrome   . Seasonal allergies   . Seizure (  [place]) 05/2015   due to sepsis, only one time episode  . Tremor   . Wears partial dentures    lower    Current Outpatient Medications on File Prior to Visit  Medication Sig Dispense Refill  . albuterol (PROVENTIL HFA;VENTOLIN HFA) 108 (90 Base) MCG/ACT inhaler INHALE 2 PUFFS BY MOUTH EVERY 4 TO 6 HOURS AS NEEDED (Patient taking differently: Inhale 2 puffs into the lungs every 4 (four) hours as needed for wheezing or shortness of breath. ) 42.5 g 0  . allopurinol (ZYLOPRIM) 300 MG tablet TAKE 1 TABLET(300 MG) BY MOUTH DAILY (Patient taking differently: Take 300 mg by mouth daily. ) 90 tablet 0  . anastrozole (ARIMIDEX) 1 MG tablet TAKE 1 TABLET(1 MG) BY MOUTH DAILY (Patient taking differently: Take 1 mg by mouth daily. ) 90 tablet 0  . atorvastatin (LIPITOR) 20 MG tablet  TAKE 1 TABLET BY MOUTH EVERY DAY (Patient taking differently: Take 20 mg by mouth daily. ) 90 tablet 1  . Biotin (BIOTIN 5000) 5 MG CAPS Take 5 mg by mouth daily.     . Calcium Carb-Cholecalciferol (CALCIUM 1000 + D PO) Take 1 tablet by mouth daily.    . colchicine 0.6 MG tablet Take 2 tablets now and 1 tablet an hour later for flare up (Patient taking differently: Take 0.6-1.2 mg by mouth daily as needed (gout). ) 90 tablet 0  . diclofenac Sodium (VOLTAREN) 1 % GEL Apply 4 g topically 4 (four) times daily. As needed for knee pain 150 g 1  . diphenhydrAMINE (BENADRYL) 25 MG tablet Take 25 mg by mouth daily as needed for itching.    . DULoxetine (CYMBALTA) 60 MG capsule Take 1 capsule (60 mg total) by mouth daily. 30 capsule 2  . HYDROcodone-acetaminophen (NORCO) 10-325 MG tablet Take 1 tablet by mouth every 8 (eight) hours as needed.    . hydrOXYzine (VISTARIL) 25 MG capsule Take 25 mg by mouth 2 (two) times daily as needed for anxiety.    Marland Kitchen levothyroxine (SYNTHROID) 75 MCG tablet Take 1 tablet (75 mcg total) by mouth daily before breakfast. 90 tablet 1  . Magnesium 500 MG CAPS Take 500 mg by mouth daily.    . Melatonin 5 MG/15ML LIQD Take 10 mg by mouth at bedtime.     . methocarbamol (ROBAXIN-750) 750 MG tablet Take 1 tablet (750 mg total) by mouth 3 (three) times daily as needed for muscle spasms. 90 tablet 1  . metoprolol succinate (TOPROL-XL) 25 MG 24 hr tablet TAKE 1/2 TABLET(12.5 MG) BY MOUTH DAILY 45 tablet 0  . olmesartan-hydrochlorothiazide (BENICAR HCT) 40-12.5 MG tablet TAKE 1 TABLET BY MOUTH DAILY 90 tablet 1  . oxyCODONE-acetaminophen (PERCOCET) 7.5-325 MG tablet Take 1 tablet by mouth every 4 (four) hours as needed.    . polyethylene glycol powder (MIRALAX) powder Take 17 g by mouth daily as needed for moderate constipation.     . primidone (MYSOLINE) 50 MG tablet TAKE 1 TABLET(50 MG) BY MOUTH AT BEDTIME (Patient taking differently: Take 50 mg by mouth at bedtime. ) 90 tablet 1  .  rOPINIRole (REQUIP) 0.5 MG tablet Take 1.5 tablets (0.75 mg total) by mouth at bedtime. 140 tablet 1  . vitamin B-12 (CYANOCOBALAMIN) 1000 MCG tablet Take 1,000 mcg by mouth daily.    . furosemide (LASIX) 20 MG tablet TAKE 1 TABLET BY MOUTH DAILY AS NEEDED (Patient not taking: No sig reported) 90 tablet 1  . Krill Oil 350 MG CAPS Take 700 mg by  mouth daily.    . methocarbamol (ROBAXIN) 750 MG tablet Take 750 mg by mouth 3 (three) times daily.     No current facility-administered medications on file prior to visit.   ROS as in subjective     Objective: BP (!) 150/60   Pulse 88   Temp (!) 96 F (35.6 C)   SpO2 91%   Wt Readings from Last 3 Encounters:  05/30/19 255 lb (115.7 kg)  05/27/19 256 lb 5 oz (116.3 kg)  04/22/19 257 lb 9.6 oz (116.8 kg)   BP Readings from Last 3 Encounters:  06/24/19 (!) 150/60  05/31/19 123/82  05/27/19 124/83    Gen: wd, wn, nad, seated in wheel chair Skin: unremarkable Lungs clear, no obvious rhonchi, rales, wheezes Heart rrr , normal s1,s2, no murmur No ext edema Abdomen: +bs, soft, seems to be tender in LLQ and Suprapubic, no other tenderness and mass Back: slow and guarded from recent surgery, tender lumbar region Pulses WNL    Assessment: Encounter Diagnoses  Name Primary?  . Abdominal pain, unspecified abdominal location Yes  . Low back pain, unspecified back pain laterality, unspecified chronicity, unspecified whether sciatica present   . Nausea   . Abnormal urine odor   . History of sepsis   . History of diverticulosis   . Recent major surgery   . Anemia, unspecified type       Plan: STAT labs today, urine culture sent.    Results to be called to daughter, [name] [phone]    Discussed possible causes, UTI, diverticulitis, kidney stone, other.   She has diverticulosis on colonoscopy prior, s/p prior colectomy.    Advise good hydration, avoid solid food until we call back with labs.  If much worse in the next several  hours per to me with fever or uncontrollable vomiting or nausea then go to emergency department.   Stina was seen today for abdominal pain.  Diagnoses and all orders for this visit:  Abdominal pain, unspecified abdominal location -     CBC with Differential/Platelet -     Basic metabolic panel -     Urine Culture -     Hepatic function panel -     POCT Urinalysis DIP (Proadvantage Device)  Low back pain, unspecified back pain laterality, unspecified chronicity, unspecified whether sciatica present -     CBC with Differential/Platelet -     Basic metabolic panel -     Urine Culture -     Hepatic function panel  Nausea -     CBC with Differential/Platelet -     Basic metabolic panel -     Urine Culture -     Hepatic function panel  Abnormal urine odor -     CBC with Differential/Platelet -     Basic metabolic panel -     Urine Culture -     Hepatic function panel  History of sepsis -     CBC with Differential/Platelet -     Basic metabolic panel -     Urine Culture -     Hepatic function panel  History of diverticulosis  Recent major surgery  Anemia, unspecified type -     Iron  Other orders -     ondansetron (ZOFRAN ODT) 4 MG disintegrating tablet; Take 1 tablet (4 mg total) by mouth every 8 (eight) hours as needed for nausea or vomiting.

## 2019-06-24 NOTE — ED Provider Notes (Signed)
[facility] Emergency Department Provider Note MRN:  [id number]  Arrival date & time: 06/24/19     Chief Complaint   Elevated Labs   History of Present Illness   Brandi Stephens is a 74 y.o. year-old female with a history of COPD, lumbar stenosis presenting to the ED with chief complaint of elevated blood.  Patient was evaluated today by her PCP for back pain.  She is recovering from back surgery in February of this year.  Testing revealed worsening kidney function she was sent here.  She was also evaluated by her neurosurgeon today and noted new leg weakness, present for the past 1 or 2 days.  Denies numbness or weakness to her arms, no bowel or bladder dysfunction.  No fever.  Review of Systems  A complete 10 system review of systems was obtained and all systems are negative except as noted in the HPI and PMH.   Patient's Health History    Past Medical History:  Diagnosis Date  . AIN (anal intraepithelial neoplasia) anal canal   . Anemia    with pregnancy  . Anxiety   . Arthritis    knees, lower back, knees  . Chronic constipation   . Coarse tremors    Essential  . COPD (chronic obstructive pulmonary disease) ([place])    2017 chest CT, history of  . Degenerative lumbar spinal stenosis   . Depression   . Drug dependency ([place])   . Elevated PTHrP level 05/09/2019  . GERD (gastroesophageal reflux disease)    pt denies  . Gout   . Grade I diastolic dysfunction 0000000   Noted ECHO  . Hemorrhoids   . History of adenomatous polyp of colon   . History of basal cell carcinoma (BCC) excision 2015   nose  . History of sepsis 05/14/2014   post lumbar surgery  . History of shingles    x2  . Hyperlipidemia   . Hypertension   . Hypothyroidism   . Irregular heart beat    history of while undergoing radiation  . Malignant neoplasm of upper-inner quadrant of left breast in female, estrogen receptor positive [facility]) oncologist-  dr Jana Hakim--  per lov in  epic no recurrence   dx 07-13-2015  Left breast invasive ductal carcinoma, Stage IA, Grade 1 (TXN0),  09-16-2015  s/p  left breast lumpectomy with sln bx's,  completed radiation 11-18-2015,  started antiestrogen therapy 12-14-2015  . Neuropathy    Left foot and back of left leg  . Personal history of radiation therapy    completed 11-18-2015  left breast  . Pneumonia   . PONV (postoperative nausea and vomiting)   . Prediabetes    diet controlled, no med  . Restless leg syndrome   . Seasonal allergies   . Seizure ([employer]) 05/2015   due to sepsis, only one time episode  . Tremor   . Wears partial dentures    lower     Past Surgical History:  Procedure Laterality Date  . ABDOMINAL HYSTERECTOMY  1985  . BASAL CELL CARCINOMA EXCISION  10/15   nose  . BREAST BIOPSY Right 08/24/2015  . BREAST BIOPSY Right 07/13/2015  . BREAST BIOPSY Left 07/13/2015  . BREAST EXCISIONAL BIOPSY Left 1972  . BREAST LUMPECTOMY Left   . BREAST LUMPECTOMY WITH RADIOACTIVE SEED AND SENTINEL LYMPH NODE BIOPSY Left 09/16/2015   Procedure: BREAST LUMPECTOMY WITH RADIOACTIVE SEED AND SENTINEL LYMPH NODE BIOPSY;  Surgeon: Stark Klein, MD;  Location: [facility];  Service: General;  Laterality: Left;  . BREAST REDUCTION SURGERY Bilateral 1998  . CATARACT EXTRACTION W/ INTRAOCULAR LENS IMPLANT Left 2016  . [place]   hallo  . COLONOSCOPY  01/2013   polyps  . EYE SURGERY Left 2016  . HAND SURGERY Left 02-19-2002   dr Fredna Dow  @[facility]    repair collateral ligament/ MPJ left thumb  . [place]  2008  . HIGH RESOLUTION ANOSCOPY N/A 02/28/2018   Procedure: HIGH RESOLUTION ANOSCOPY WITH BIOPSY;  Surgeon: Leighton Ruff, MD;  [facility];  Service: General;  Laterality: N/A;  . KNEE ARTHROSCOPY Right 2002  . LUMBAR SPINE SURGERY  03-20-2015   dr Kathyrn Sheriff   fusion L4-5, L5-S1  . MOUTH SURGERY  07/14/2018   2 infected teeth removed  . MULTIPLE TOOTH  EXTRACTIONS     with bone grafting lower bottom right and left  . REDUCTION MAMMAPLASTY Bilateral   . RIGHT COLECTOMY  09-10-2003   dr Margot Chimes @[facility]    multiple colon polyps (per path tubular adenoma's, hyperplastic , benign appendix, benign two lymph nodes  . ROTATOR CUFF REPAIR Left 04/2016  . TONSILLECTOMY  1969  . TUBAL LIGATION Bilateral 1982    Family History  Problem Relation Age of Onset  . Atrial fibrillation Mother   . Ulcers Father   . Breast cancer Maternal Aunt   . Lung cancer Maternal Uncle   . Stomach cancer Maternal Grandmother   . Lung cancer Maternal Aunt   . Brain cancer Maternal Aunt   . Prostate cancer Maternal Grandfather   . Stroke Paternal Grandmother   . Tuberculosis Paternal Grandfather   . Colon cancer Neg Hx   . Esophageal cancer Neg Hx   . Rectal cancer Neg Hx     Social History   Socioeconomic History  . Marital status: Married    Spouse name: robert  . Number of children: 3  . Years of education: college  . Highest education level: Not on file  Occupational History  . Occupation: retired  Tobacco Use  . Smoking status: Former Smoker    Packs/day: 0.50    Years: 35.00    Pack years: 17.50    Types: Cigarettes    Quit date: 05/22/2018    Years since quitting: 1.0  . Smokeless tobacco: Never Used  Substance and Sexual Activity  . Alcohol use: Yes    Comment: 1 glass of wine a night  . Drug use: Never    Comment: CBD tincture  . Sexual activity: Yes    Birth control/protection: Post-menopausal  Other Topics Concern  . Not on file  Social History Narrative   Lives with husband in a 2 story home.  Has 2 living children.  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  Education: college.    Right-handed.   Five cups coffee per week.   Social Determinants of Health   Financial Resource Strain:   . Difficulty of Paying Living Expenses: Not on file  Food Insecurity:   . Worried About Charity fundraiser in the Last Year: Not on file  .  Ran Out of Food in the Last Year: Not on file  Transportation Needs:   . Lack of Transportation (Medical): Not on file  . Lack of Transportation (Non-Medical): Not on file  Physical Activity:   . Days of Exercise per Week: Not on file  . Minutes of Exercise per Session: Not on file  Stress:   .  Feeling of Stress : Not on file  Social Connections:   . Frequency of Communication with Friends and Family: Not on file  . Frequency of Social Gatherings with Friends and Family: Not on file  . Attends Religious Services: Not on file  . Active Member of Clubs or Organizations: Not on file  . Attends Archivist Meetings: Not on file  . Marital Status: Not on file  Intimate Partner Violence:   . Fear of Current or Ex-Partner: Not on file  . Emotionally Abused: Not on file  . Physically Abused: Not on file  . Sexually Abused: Not on file     Physical Exam   Vitals:   06/24/19 2106  BP: 113/85  Pulse: 95  Resp: 20  Temp: 97.9 F (36.6 C)  SpO2: 93%    CONSTITUTIONAL: Well-appearing, NAD NEURO:  Alert and oriented x 3, no focal deficits EYES:  eyes equal and reactive ENT/NECK:  no LAD, no JVD CARDIO: Regular rate, well-perfused, normal S1 and S2 PULM:  CTAB no wheezing or rhonchi GI/GU:  normal bowel sounds, non-distended, mild tenderness palpation to the left lower quadrant MSK/SPINE:  No gross deformities, no edema SKIN:  no rash, atraumatic PSYCH:  Appropriate speech and behavior  *Additional and/or pertinent findings included in MDM below  Diagnostic and Interventional Summary    EKG Interpretation  Date/Time:    Ventricular Rate:    PR Interval:    QRS Duration:   QT Interval:    QTC Calculation:   R Axis:     Text Interpretation:        Cardiac Monitoring Interpretation:  Labs Reviewed  SARS CORONAVIRUS 2 (TAT 6-24 HRS)  CBC  COMPREHENSIVE METABOLIC PANEL  URINALYSIS, ROUTINE W REFLEX MICROSCOPIC    CT RENAL STONE STUDY    (Results Pending)     Medications  sodium chloride 0.9 % bolus 1,000 mL (has no administration in time range)     Procedures  /  Critical Care .Critical Care Performed by: Maudie Flakes, MD Authorized by: Maudie Flakes, MD   Critical care provider statement:    Critical care time (minutes):  35   Critical care was necessary to treat or prevent imminent or life-threatening deterioration of the following conditions:  Renal failure   Critical care was time spent personally by me on the following activities:  Discussions with consultants, evaluation of patient's response to treatment, examination of patient, ordering and performing treatments and interventions, ordering and review of laboratory studies, ordering and review of radiographic studies, pulse oximetry, re-evaluation of patient's condition, obtaining history from patient or surrogate and review of old charts    ED Course and Medical Decision Making  I have reviewed the triage vital signs, the nursing notes, and pertinent available records from the EMR.  Pertinent labs & imaging results that were available during my care of the patient were reviewed by me and considered in my medical decision making (see below for details).     Acute renal failure, will repeat labs and patient will need admission.  Patient is also endorsing left leg weakness, recent surgery, suspect need for MRI we will try to touch base with neurosurgery.  10:38 PM update: Given the worsening kidney function and need for MRI, I attempted to admit to hospital service, likely for admission to [facility] so that she can undergo MRI imaging this evening.  Discussed with the hospitalist, who was concerned that this would be delayed given the bed situation.  Best practice would be ER to ER transfer for more rapid MRI given patient's weakness.  Will need admission thereafter.  Accepted for transfer by Dr. Dayna Barker of the [facility] emergency department.  Barth Kirks. [place] Small, [place] [email or web address]  Final Clinical Impressions(s) / ED Diagnoses     ICD-10-CM   1. Acute renal failure, unspecified acute renal failure type ([place])  N17.9   2. Weakness of left lower extremity  R29.898     ED Discharge Orders    None       Discharge Instructions Discussed with and Provided to Patient:   Discharge Instructions   None       Maudie Flakes, MD 06/24/19 2240

## 2019-06-24 NOTE — ED Notes (Signed)
Patient transported to CT 

## 2019-06-25 ENCOUNTER — Encounter (HOSPITAL_COMMUNITY): Payer: Self-pay | Admitting: Family Medicine

## 2019-06-25 ENCOUNTER — Emergency Department (HOSPITAL_COMMUNITY): Payer: PPO

## 2019-06-25 DIAGNOSIS — E782 Mixed hyperlipidemia: Secondary | ICD-10-CM | POA: Diagnosis not present

## 2019-06-25 DIAGNOSIS — M48062 Spinal stenosis, lumbar region with neurogenic claudication: Secondary | ICD-10-CM | POA: Diagnosis not present

## 2019-06-25 DIAGNOSIS — M48061 Spinal stenosis, lumbar region without neurogenic claudication: Secondary | ICD-10-CM | POA: Diagnosis present

## 2019-06-25 DIAGNOSIS — N183 Chronic kidney disease, stage 3 unspecified: Secondary | ICD-10-CM | POA: Diagnosis present

## 2019-06-25 DIAGNOSIS — G8918 Other acute postprocedural pain: Secondary | ICD-10-CM | POA: Diagnosis not present

## 2019-06-25 DIAGNOSIS — T380X5A Adverse effect of glucocorticoids and synthetic analogues, initial encounter: Secondary | ICD-10-CM | POA: Diagnosis not present

## 2019-06-25 DIAGNOSIS — R7303 Prediabetes: Secondary | ICD-10-CM | POA: Diagnosis not present

## 2019-06-25 DIAGNOSIS — R2689 Other abnormalities of gait and mobility: Secondary | ICD-10-CM | POA: Diagnosis not present

## 2019-06-25 DIAGNOSIS — N1832 Chronic kidney disease, stage 3b: Secondary | ICD-10-CM | POA: Diagnosis present

## 2019-06-25 DIAGNOSIS — D62 Acute posthemorrhagic anemia: Secondary | ICD-10-CM | POA: Diagnosis not present

## 2019-06-25 DIAGNOSIS — I5032 Chronic diastolic (congestive) heart failure: Secondary | ICD-10-CM | POA: Diagnosis not present

## 2019-06-25 DIAGNOSIS — M62838 Other muscle spasm: Secondary | ICD-10-CM | POA: Diagnosis not present

## 2019-06-25 DIAGNOSIS — Z87891 Personal history of nicotine dependence: Secondary | ICD-10-CM | POA: Diagnosis not present

## 2019-06-25 DIAGNOSIS — E46 Unspecified protein-calorie malnutrition: Secondary | ICD-10-CM | POA: Diagnosis not present

## 2019-06-25 DIAGNOSIS — D539 Nutritional anemia, unspecified: Secondary | ICD-10-CM | POA: Diagnosis not present

## 2019-06-25 DIAGNOSIS — F320 Major depressive disorder, single episode, mild: Secondary | ICD-10-CM | POA: Diagnosis not present

## 2019-06-25 DIAGNOSIS — R296 Repeated falls: Secondary | ICD-10-CM | POA: Diagnosis present

## 2019-06-25 DIAGNOSIS — Z885 Allergy status to narcotic agent status: Secondary | ICD-10-CM | POA: Diagnosis not present

## 2019-06-25 DIAGNOSIS — C50212 Malignant neoplasm of upper-inner quadrant of left female breast: Secondary | ICD-10-CM | POA: Diagnosis not present

## 2019-06-25 DIAGNOSIS — J449 Chronic obstructive pulmonary disease, unspecified: Secondary | ICD-10-CM | POA: Diagnosis present

## 2019-06-25 DIAGNOSIS — I10 Essential (primary) hypertension: Secondary | ICD-10-CM | POA: Diagnosis not present

## 2019-06-25 DIAGNOSIS — R0989 Other specified symptoms and signs involving the circulatory and respiratory systems: Secondary | ICD-10-CM | POA: Diagnosis not present

## 2019-06-25 DIAGNOSIS — E871 Hypo-osmolality and hyponatremia: Secondary | ICD-10-CM | POA: Diagnosis present

## 2019-06-25 DIAGNOSIS — D649 Anemia, unspecified: Secondary | ICD-10-CM | POA: Diagnosis not present

## 2019-06-25 DIAGNOSIS — G894 Chronic pain syndrome: Secondary | ICD-10-CM | POA: Diagnosis not present

## 2019-06-25 DIAGNOSIS — I519 Heart disease, unspecified: Secondary | ICD-10-CM | POA: Diagnosis not present

## 2019-06-25 DIAGNOSIS — F419 Anxiety disorder, unspecified: Secondary | ICD-10-CM | POA: Diagnosis present

## 2019-06-25 DIAGNOSIS — R739 Hyperglycemia, unspecified: Secondary | ICD-10-CM | POA: Diagnosis not present

## 2019-06-25 DIAGNOSIS — E039 Hypothyroidism, unspecified: Secondary | ICD-10-CM | POA: Diagnosis present

## 2019-06-25 DIAGNOSIS — R29898 Other symptoms and signs involving the musculoskeletal system: Secondary | ICD-10-CM | POA: Diagnosis present

## 2019-06-25 DIAGNOSIS — G2581 Restless legs syndrome: Secondary | ICD-10-CM | POA: Diagnosis present

## 2019-06-25 DIAGNOSIS — E038 Other specified hypothyroidism: Secondary | ICD-10-CM | POA: Diagnosis not present

## 2019-06-25 DIAGNOSIS — K219 Gastro-esophageal reflux disease without esophagitis: Secondary | ICD-10-CM | POA: Diagnosis present

## 2019-06-25 DIAGNOSIS — E785 Hyperlipidemia, unspecified: Secondary | ICD-10-CM | POA: Diagnosis present

## 2019-06-25 DIAGNOSIS — N182 Chronic kidney disease, stage 2 (mild): Secondary | ICD-10-CM | POA: Diagnosis not present

## 2019-06-25 DIAGNOSIS — E8809 Other disorders of plasma-protein metabolism, not elsewhere classified: Secondary | ICD-10-CM | POA: Diagnosis not present

## 2019-06-25 DIAGNOSIS — Z720 Tobacco use: Secondary | ICD-10-CM | POA: Diagnosis not present

## 2019-06-25 DIAGNOSIS — F1010 Alcohol abuse, uncomplicated: Secondary | ICD-10-CM | POA: Diagnosis not present

## 2019-06-25 DIAGNOSIS — M109 Gout, unspecified: Secondary | ICD-10-CM | POA: Diagnosis present

## 2019-06-25 DIAGNOSIS — T8149XA Infection following a procedure, other surgical site, initial encounter: Secondary | ICD-10-CM | POA: Diagnosis not present

## 2019-06-25 DIAGNOSIS — Z881 Allergy status to other antibiotic agents status: Secondary | ICD-10-CM | POA: Diagnosis not present

## 2019-06-25 DIAGNOSIS — Z880 Allergy status to penicillin: Secondary | ICD-10-CM | POA: Diagnosis not present

## 2019-06-25 DIAGNOSIS — M4626 Osteomyelitis of vertebra, lumbar region: Secondary | ICD-10-CM | POA: Diagnosis present

## 2019-06-25 DIAGNOSIS — F329 Major depressive disorder, single episode, unspecified: Secondary | ICD-10-CM | POA: Diagnosis present

## 2019-06-25 DIAGNOSIS — E876 Hypokalemia: Secondary | ICD-10-CM | POA: Diagnosis not present

## 2019-06-25 DIAGNOSIS — K5909 Other constipation: Secondary | ICD-10-CM | POA: Diagnosis present

## 2019-06-25 DIAGNOSIS — K567 Ileus, unspecified: Secondary | ICD-10-CM | POA: Diagnosis not present

## 2019-06-25 DIAGNOSIS — R251 Tremor, unspecified: Secondary | ICD-10-CM | POA: Diagnosis present

## 2019-06-25 DIAGNOSIS — N179 Acute kidney failure, unspecified: Secondary | ICD-10-CM | POA: Diagnosis present

## 2019-06-25 DIAGNOSIS — R109 Unspecified abdominal pain: Secondary | ICD-10-CM | POA: Diagnosis not present

## 2019-06-25 DIAGNOSIS — Z981 Arthrodesis status: Secondary | ICD-10-CM | POA: Diagnosis not present

## 2019-06-25 DIAGNOSIS — Z888 Allergy status to other drugs, medicaments and biological substances status: Secondary | ICD-10-CM | POA: Diagnosis not present

## 2019-06-25 DIAGNOSIS — J431 Panlobular emphysema: Secondary | ICD-10-CM | POA: Diagnosis not present

## 2019-06-25 DIAGNOSIS — Z20822 Contact with and (suspected) exposure to covid-19: Secondary | ICD-10-CM | POA: Diagnosis present

## 2019-06-25 DIAGNOSIS — E875 Hyperkalemia: Secondary | ICD-10-CM | POA: Diagnosis not present

## 2019-06-25 DIAGNOSIS — M5416 Radiculopathy, lumbar region: Secondary | ICD-10-CM | POA: Diagnosis not present

## 2019-06-25 DIAGNOSIS — I129 Hypertensive chronic kidney disease with stage 1 through stage 4 chronic kidney disease, or unspecified chronic kidney disease: Secondary | ICD-10-CM | POA: Diagnosis present

## 2019-06-25 DIAGNOSIS — E669 Obesity, unspecified: Secondary | ICD-10-CM | POA: Diagnosis not present

## 2019-06-25 DIAGNOSIS — Z961 Presence of intraocular lens: Secondary | ICD-10-CM | POA: Diagnosis present

## 2019-06-25 LAB — URINALYSIS, ROUTINE W REFLEX MICROSCOPIC
Glucose, UA: NEGATIVE mg/dL
Hgb urine dipstick: NEGATIVE
Ketones, ur: NEGATIVE mg/dL
Nitrite: NEGATIVE
Protein, ur: NEGATIVE mg/dL
Specific Gravity, Urine: 1.02 (ref 1.005–1.030)
pH: 5 (ref 5.0–8.0)

## 2019-06-25 LAB — BASIC METABOLIC PANEL
Anion gap: 15 (ref 5–15)
BUN: 68 mg/dL — ABNORMAL HIGH (ref 8–23)
CO2: 21 mmol/L — ABNORMAL LOW (ref 22–32)
Calcium: 9.6 mg/dL (ref 8.9–10.3)
Chloride: 100 mmol/L (ref 98–111)
Creatinine, Ser: 2.48 mg/dL — ABNORMAL HIGH (ref 0.44–1.00)
GFR calc Af Amer: 22 mL/min — ABNORMAL LOW (ref >60)
GFR calc non Af Amer: 19 mL/min — ABNORMAL LOW (ref >60)
Glucose, Bld: 113 mg/dL — ABNORMAL HIGH (ref 70–99)
Potassium: 4.9 mmol/L (ref 3.5–5.1)
Sodium: 136 mmol/L (ref 135–145)

## 2019-06-25 LAB — CBC
HCT: 29.1 % — ABNORMAL LOW (ref 36.0–46.0)
Hemoglobin: 9.1 g/dL — ABNORMAL LOW (ref 12.0–15.0)
MCH: 31.5 pg (ref 26.0–34.0)
MCHC: 31.3 g/dL (ref 30.0–36.0)
MCV: 100.7 fL — ABNORMAL HIGH (ref 80.0–100.0)
Platelets: 330 10*3/uL (ref 150–400)
RBC: 2.89 MIL/uL — ABNORMAL LOW (ref 3.87–5.11)
RDW: 14.3 % (ref 11.5–15.5)
WBC: 10.1 10*3/uL (ref 4.0–10.5)
nRBC: 0 % (ref 0.0–0.2)

## 2019-06-25 LAB — CREATININE, URINE, RANDOM: Creatinine, Urine: 169.18 mg/dL

## 2019-06-25 LAB — HEPATIC FUNCTION PANEL
ALT: 46 IU/L — ABNORMAL HIGH (ref 0–32)
AST: 44 IU/L — ABNORMAL HIGH (ref 0–40)
Albumin: 3.8 g/dL (ref 3.7–4.7)
Alkaline Phosphatase: 126 IU/L — ABNORMAL HIGH (ref 39–117)
Bilirubin Total: <0.2 mg/dL (ref 0.0–1.2)
Bilirubin, Direct: 0.11 mg/dL (ref 0.00–0.40)
Total Protein: 6.5 g/dL (ref 6.0–8.5)

## 2019-06-25 LAB — SEDIMENTATION RATE: Sed Rate: 136 mm/hr — ABNORMAL HIGH (ref 0–22)

## 2019-06-25 LAB — SODIUM, URINE, RANDOM: Sodium, Ur: 70 mmol/L

## 2019-06-25 LAB — LACTIC ACID, PLASMA: Lactic Acid, Venous: 0.9 mmol/L (ref 0.5–1.9)

## 2019-06-25 LAB — IRON: Iron: 29 ug/dL (ref 27–139)

## 2019-06-25 LAB — URINE CULTURE

## 2019-06-25 LAB — SARS CORONAVIRUS 2 (TAT 6-24 HRS): SARS Coronavirus 2: NEGATIVE

## 2019-06-25 LAB — C-REACTIVE PROTEIN: CRP: 27.3 mg/dL — ABNORMAL HIGH (ref <1.0)

## 2019-06-25 MED ORDER — LEVOTHYROXINE SODIUM 75 MCG PO TABS
75.0000 ug | ORAL_TABLET | Freq: Every day | ORAL | Status: DC
  Administered 2019-06-25 – 2019-06-28 (×4): 75 ug via ORAL
  Filled 2019-06-25 (×4): qty 1

## 2019-06-25 MED ORDER — MAGNESIUM GLUCONATE 500 MG PO TABS
500.0000 mg | ORAL_TABLET | Freq: Every day | ORAL | Status: DC
  Filled 2019-06-25: qty 1

## 2019-06-25 MED ORDER — ONDANSETRON HCL 4 MG PO TABS: 4.0000 mg | ORAL_TABLET | Freq: Four times a day (QID) | ORAL | Status: DC | PRN

## 2019-06-25 MED ORDER — FENTANYL CITRATE (PF) 100 MCG/2ML IJ SOLN
12.5000 ug | INTRAMUSCULAR | Status: DC | PRN
  Administered 2019-06-25 – 2019-06-28 (×11): 25 ug via INTRAVENOUS
  Filled 2019-06-25 (×11): qty 2

## 2019-06-25 MED ORDER — ATORVASTATIN CALCIUM 10 MG PO TABS
20.0000 mg | ORAL_TABLET | Freq: Every day | ORAL | Status: DC
  Administered 2019-06-25 – 2019-06-27 (×3): 20 mg via ORAL
  Filled 2019-06-25 (×4): qty 2

## 2019-06-25 MED ORDER — MAGNESIUM CITRATE PO SOLN
1.0000 | Freq: Once | ORAL | Status: DC
  Filled 2019-06-25: qty 296

## 2019-06-25 MED ORDER — ACETAMINOPHEN 325 MG PO TABS
650.0000 mg | ORAL_TABLET | Freq: Four times a day (QID) | ORAL | Status: DC | PRN
  Administered 2019-06-25 – 2019-06-26 (×4): 650 mg via ORAL
  Filled 2019-06-25 (×4): qty 2

## 2019-06-25 MED ORDER — ONDANSETRON HCL 4 MG/2ML IJ SOLN
4.0000 mg | Freq: Four times a day (QID) | INTRAMUSCULAR | Status: DC | PRN
  Administered 2019-06-26: 10:00:00 4 mg via INTRAVENOUS
  Filled 2019-06-25: qty 2

## 2019-06-25 MED ORDER — METHOCARBAMOL 750 MG PO TABS
750.0000 mg | ORAL_TABLET | Freq: Three times a day (TID) | ORAL | Status: DC | PRN
  Administered 2019-06-25 – 2019-06-26 (×5): 750 mg via ORAL
  Filled 2019-06-25 (×5): qty 1

## 2019-06-25 MED ORDER — ALLOPURINOL 300 MG PO TABS
300.0000 mg | ORAL_TABLET | Freq: Every day | ORAL | Status: DC
  Administered 2019-06-25 – 2019-06-28 (×4): 300 mg via ORAL
  Filled 2019-06-25 (×4): qty 1

## 2019-06-25 MED ORDER — MAGNESIUM GLUCONATE 500 MG PO TABS
1000.0000 mg | ORAL_TABLET | Freq: Every day | ORAL | Status: DC
  Filled 2019-06-25: qty 2

## 2019-06-25 MED ORDER — PRIMIDONE 50 MG PO TABS
50.0000 mg | ORAL_TABLET | Freq: Every day | ORAL | Status: DC
  Administered 2019-06-25 – 2019-06-27 (×3): 50 mg via ORAL
  Filled 2019-06-25 (×4): qty 1

## 2019-06-25 MED ORDER — SODIUM CHLORIDE 0.9 % IV SOLN: INTRAVENOUS | Status: AC

## 2019-06-25 MED ORDER — DIPHENHYDRAMINE HCL 25 MG PO CAPS: 25.0000 mg | ORAL_CAPSULE | Freq: Every day | ORAL | Status: DC | PRN

## 2019-06-25 MED ORDER — MELATONIN 3 MG PO TABS
10.5000 mg | ORAL_TABLET | Freq: Every day | ORAL | Status: DC
  Administered 2019-06-25 – 2019-06-27 (×3): 10.5 mg via ORAL
  Filled 2019-06-25 (×4): qty 3.5

## 2019-06-25 MED ORDER — DOCUSATE SODIUM 100 MG PO CAPS
100.0000 mg | ORAL_CAPSULE | Freq: Two times a day (BID) | ORAL | Status: DC
  Administered 2019-06-25 – 2019-06-28 (×6): 100 mg via ORAL
  Filled 2019-06-25 (×7): qty 1

## 2019-06-25 MED ORDER — ANASTROZOLE 1 MG PO TABS
1.0000 mg | ORAL_TABLET | Freq: Every day | ORAL | Status: DC
  Administered 2019-06-25 – 2019-06-28 (×4): 1 mg via ORAL
  Filled 2019-06-25 (×4): qty 1

## 2019-06-25 MED ORDER — MAGNESIUM GLUCONATE 500 MG PO TABS
1000.0000 mg | ORAL_TABLET | Freq: Every day | ORAL | Status: DC
  Administered 2019-06-25 – 2019-06-27 (×3): 1000 mg via ORAL
  Filled 2019-06-25 (×4): qty 2

## 2019-06-25 MED ORDER — DULOXETINE HCL 60 MG PO CPEP
60.0000 mg | ORAL_CAPSULE | Freq: Every day | ORAL | Status: DC
  Administered 2019-06-25 – 2019-06-28 (×4): 60 mg via ORAL
  Filled 2019-06-25 (×4): qty 1

## 2019-06-25 MED ORDER — ALBUTEROL SULFATE (2.5 MG/3ML) 0.083% IN NEBU: 3.0000 mL | INHALATION_SOLUTION | RESPIRATORY_TRACT | Status: DC | PRN

## 2019-06-25 MED ORDER — METOPROLOL SUCCINATE ER 25 MG PO TB24
12.5000 mg | ORAL_TABLET | Freq: Every day | ORAL | Status: DC
  Administered 2019-06-25 – 2019-06-28 (×4): 12.5 mg via ORAL
  Filled 2019-06-25 (×4): qty 1

## 2019-06-25 MED ORDER — ROPINIROLE HCL 0.5 MG PO TABS
0.7500 mg | ORAL_TABLET | Freq: Every day | ORAL | Status: DC
  Administered 2019-06-25 – 2019-06-27 (×3): 0.75 mg via ORAL
  Filled 2019-06-25 (×3): qty 2

## 2019-06-25 MED ORDER — ACETAMINOPHEN 650 MG RE SUPP: 650.0000 mg | Freq: Four times a day (QID) | RECTAL | Status: DC | PRN

## 2019-06-25 MED ORDER — MAGNESIUM GLUCONATE 500 MG PO TABS: 1000.0000 mg | ORAL_TABLET | Freq: Every day | ORAL | Status: DC

## 2019-06-25 MED ORDER — VITAMIN B-12 1000 MCG PO TABS
1000.0000 ug | ORAL_TABLET | Freq: Every day | ORAL | Status: DC
  Administered 2019-06-25 – 2019-06-28 (×4): 1000 ug via ORAL
  Filled 2019-06-25 (×4): qty 1

## 2019-06-25 NOTE — Plan of Care (Signed)

## 2019-06-25 NOTE — Progress Notes (Signed)
74 year old lady with history of low back decompression and fusion approximately a month ago presented to ED early morning with back pain, left leg weakness and abnormal blood work.  MRI back findings were concerning for abscess, osteomyelitis and surgical site infection.  Neurosurgery was consulted.  Seen by neurosurgery now.  No plans for surgical intervention at this point in time.  Will start on cardiac diet.  Patient's pain remains the same with somewhat improvement compared to yesterday.  Neurosurgery recommends continuing to hold antibiotics and wait for the cultures and treat AKI.  Her AKI on CKD stage IIIb is likely due to dehydration, prerenal.  Continue IV fluids.  Repeat labs in the morning. On exam.  She is alert and oriented.  Lungs clear to auscultation.  Abdomen soft nontender.  On back, her surgical site looks clean but she is tender to palpation.  Blood pressure remains low normal.  Continue to hold antihypertensives for now.  Continue Toprol-XL though.  Continue Synthroid.

## 2019-06-25 NOTE — Consult Note (Signed)
Chief Complaint   Chief Complaint  Patient presents with  . Elevated Labs    HPI   Consult requested by: Dr Myna Hidalgo, [facility] Reason for consult: LBP, post op  HPI: Brandi Stephens is a 74 y.o. female well known to Dr Kathyrn Sheriff, s/p L2-3 decompression with extension of fusion from W3-S9 on 3/73, uncomplicated hospital course discharged 2/12, who presented to the office yesterday for her first post op visit complaining of lower back pain, leg weakness, suprapubic/LLQ pain, confusion and falls. Xrays were obtained in the office and unremarkable. She was directed to go to either her PCP or local urgent care for further work up of her abd pain. She was seen at her [facility] office by Chana Bode PA-C. UA, CBC, BMP and HFP were ordered. Labs significant for possible UTI, mild leukocytosis, improving anemia and AKI. She was directed to ED.   ED Course: Labs reordered and essentially stable, as above. CRP 27.3, ESR 136   CT urogram negative for stone/hydronephrosis.  MRI L spine ordered revealing per radiology - post op changes, ?infection. NSY consultation requested.  Patient admitted under [facility] for management of AKI. Antibiotics were held. Patient is NPO.  She states her current pain "stems from her kidneys". She is well versed with back pain given multiple surgeries and states this is a different pain. Pain is worse with movement, almost like a tightening. Pain is more so off to the left & radiates to LLQ. Feels better compared to yesterday since being given IVF. Subjective LLE weakness improved. No radicular symptoms. No bowel or bladder dysfunction.  Patient Active Problem List   Diagnosis Date Noted  . Acute renal failure superimposed on stage 3 chronic kidney disease ([place]) 06/25/2019  . Weakness of left lower extremity   . Abdominal pain 06/24/2019  . Nausea 06/24/2019  . Abnormal urine odor 06/24/2019  . History of sepsis 06/24/2019  . Recent major surgery 06/24/2019  . History  of diverticulosis 06/24/2019  . Elevated PTHrP level 05/09/2019  . Paresthesia 02/12/2019  . Low back pain 02/12/2019  . Neuropathy 12/20/2018  . Lumbar spinal stenosis 06/29/2018  . Preoperative clearance 06/04/2018  . Abnormal CT of the chest 06/04/2018  . Smoker 05/17/2018  . Hypothyroidism 11/30/2017  . Thoracic aortic atherosclerosis ([place]) 09/14/2016  . Benign essential tremor 09/09/2016  . RLS (restless legs syndrome) 09/09/2016  . H/O adenomatous polyp of colon 07/06/2016  . PVC's (premature ventricular contractions) 03/29/2016  . Preoperative cardiovascular examination 03/29/2016  . Grade I diastolic dysfunction 42/87/6811  . Palpitations 01/11/2016  . Shortness of breath 01/11/2016  . Former cigarette smoker 01/11/2016  . Leg edema 01/11/2016  . Chronic cough 10/28/2015  . 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 Malignant neoplasm of upper-inner quadrant of left breast in female, estrogen receptor positive ([place]) 07/14/2015  . Prediabetes 07/08/2015  . CKD (chronic kidney disease), stage III 07/08/2015  . Dependent edema 06/08/2015  . Sleep disturbance 06/08/2015  . Mixed hyperlipidemia 06/08/2015  . Generalized anxiety disorder 06/08/2015  . Essential hypertension 06/08/2015  . Emphysema lung ([place]) 06/08/2015  . Gout 06/08/2015  . Macrocytic anemia   . 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 Sepsis ([place]) 05/15/2015  . Lumbar spondylosis 03/20/2015    PMH: Past Medical History:  Diagnosis Date  . AIN (anal intraepithelial neoplasia) anal canal   . Anemia    with pregnancy  . Anxiety   . Arthritis    knees, lower back, knees  . Chronic constipation   .  Coarse tremors    Essential  . COPD (chronic obstructive pulmonary disease) ([place])    2017 chest CT, history of  . Degenerative lumbar spinal stenosis   . Depression   . Drug dependency ([place])   . Elevated PTHrP level 05/09/2019  . GERD (gastroesophageal reflux disease)    pt denies  . Gout   . Grade I  diastolic dysfunction 67/89/3810   Noted ECHO  . Hemorrhoids   . History of adenomatous polyp of colon   . History of basal cell carcinoma (BCC) excision 2015   nose  . History of sepsis 05/14/2014   post lumbar surgery  . History of shingles    x2  . Hyperlipidemia   . Hypertension   . Hypothyroidism   . Irregular heart beat    history of while undergoing radiation  . Malignant neoplasm of upper-inner quadrant of left breast in female, estrogen receptor positive [facility]) oncologist-  dr Jana Hakim--  per lov in epic no recurrence   dx 07-13-2015  Left breast invasive ductal carcinoma, Stage IA, Grade 1 (TXN0),  09-16-2015  s/p  left breast lumpectomy with sln bx's,  completed radiation 11-18-2015,  started antiestrogen therapy 12-14-2015  . Neuropathy    Left foot and back of left leg  . Personal history of radiation therapy    completed 11-18-2015  left breast  . Pneumonia   . PONV (postoperative nausea and vomiting)   . Prediabetes    diet controlled, no med  . Restless leg syndrome   . Seasonal allergies   . Seizure ([place]) 05/2015   due to sepsis, only one time episode  . Tremor   . Wears partial dentures    lower     PSH: Past Surgical History:  Procedure Laterality Date  . ABDOMINAL HYSTERECTOMY  1985  . BASAL CELL CARCINOMA EXCISION  10/15   nose  . BREAST BIOPSY Right 08/24/2015  . BREAST BIOPSY Right 07/13/2015  . BREAST BIOPSY Left 07/13/2015  . BREAST EXCISIONAL BIOPSY Left 1972  . BREAST LUMPECTOMY Left   . BREAST LUMPECTOMY WITH RADIOACTIVE SEED AND SENTINEL LYMPH NODE BIOPSY Left 09/16/2015   Procedure: BREAST LUMPECTOMY WITH RADIOACTIVE SEED AND SENTINEL LYMPH NODE BIOPSY;  Surgeon: Stark Klein, MD;  Location: [place];  Service: General;  Laterality: Left;  . BREAST REDUCTION SURGERY Bilateral 1998  . CATARACT EXTRACTION W/ INTRAOCULAR LENS IMPLANT Left 2016  . [place]   hallo  . COLONOSCOPY  01/2013   polyps  . EYE  SURGERY Left 2016  . HAND SURGERY Left 02-19-2002   dr Fredna Dow  '@[email or web address]'    repair collateral ligament/ MPJ left thumb  . [place]  2008  . HIGH RESOLUTION ANOSCOPY N/A 02/28/2018   Procedure: HIGH RESOLUTION ANOSCOPY WITH BIOPSY;  Surgeon: Leighton Ruff, MD;  Location[facility];  Service: General;  Laterality: N/A;  . KNEE ARTHROSCOPY Right 2002  . LUMBAR SPINE SURGERY  03-20-2015   dr Kathyrn Sheriff   fusion L4-5, L5-S1  . MOUTH SURGERY  07/14/2018   2 infected teeth removed  . MULTIPLE TOOTH EXTRACTIONS     with bone grafting lower bottom right and left  . REDUCTION MAMMAPLASTY Bilateral   . RIGHT COLECTOMY  09-10-2003   dr Margot Chimes '@[email or web address]'    multiple colon polyps (per path tubular adenoma's, hyperplastic , benign appendix, benign two lymph nodes  . ROTATOR CUFF REPAIR Left 04/2016  . TONSILLECTOMY  1969  . TUBAL LIGATION Bilateral 1982  Medications Prior to Admission  Medication Sig Dispense Refill Last Dose  . albuterol (PROVENTIL HFA;VENTOLIN HFA) 108 (90 Base) MCG/ACT inhaler INHALE 2 PUFFS BY MOUTH EVERY 4 TO 6 HOURS AS NEEDED (Patient taking differently: Inhale 2 puffs into the lungs every 4 (four) hours as needed for wheezing or shortness of breath. ) 42.5 g 0 Past Month at Unknown time  . allopurinol (ZYLOPRIM) 300 MG tablet TAKE 1 TABLET(300 MG) BY MOUTH DAILY (Patient taking differently: Take 300 mg by mouth daily. ) 90 tablet 0 06/24/2019 at Unknown time  . anastrozole (ARIMIDEX) 1 MG tablet TAKE 1 TABLET(1 MG) BY MOUTH DAILY (Patient taking differently: Take 1 mg by mouth daily. ) 90 tablet 0 06/24/2019 at Unknown time  . atorvastatin (LIPITOR) 20 MG tablet TAKE 1 TABLET BY MOUTH EVERY DAY (Patient taking differently: Take 20 mg by mouth daily. ) 90 tablet 1 06/24/2019 at Unknown time  . Biotin (BIOTIN 5000) 5 MG CAPS Take 5 mg by mouth daily.    06/24/2019 at Unknown time  . Calcium Carb-Cholecalciferol (CALCIUM 1000 + D PO) Take 1 tablet by mouth daily.   06/24/2019  at Unknown time  . colchicine 0.6 MG tablet Take 2 tablets now and 1 tablet an hour later for flare up (Patient taking differently: Take 0.6-1.2 mg by mouth daily as needed (gout). ) 90 tablet 0 Past Week at Unknown time  . diclofenac Sodium (VOLTAREN) 1 % GEL Apply 4 g topically 4 (four) times daily. As needed for knee pain 150 g 1 Past Week at Unknown time  . diphenhydrAMINE (BENADRYL) 25 MG tablet Take 25 mg by mouth daily as needed for itching.   06/24/2019 at Unknown time  . DULoxetine (CYMBALTA) 60 MG capsule Take 1 capsule (60 mg total) by mouth daily. 30 capsule 2 06/24/2019 at Unknown time  . hydrOXYzine (VISTARIL) 25 MG capsule Take 25 mg by mouth 2 (two) times daily as needed for anxiety.   06/24/2019 at Unknown time  . Krill Oil 350 MG CAPS Take 700 mg by mouth daily.   06/24/2019 at Unknown time  . levothyroxine (SYNTHROID) 75 MCG tablet Take 1 tablet (75 mcg total) by mouth daily before breakfast. 90 tablet 1 06/24/2019 at Unknown time  . Magnesium 500 MG CAPS Take 500 mg by mouth daily.   06/24/2019 at Unknown time  . Melatonin 5 MG/15ML LIQD Take 10 mg by mouth at bedtime.    06/23/2019 at Unknown time  . methocarbamol (ROBAXIN-750) 750 MG tablet Take 1 tablet (750 mg total) by mouth 3 (three) times daily as needed for muscle spasms. 90 tablet 1 06/24/2019 at AM  . metoprolol succinate (TOPROL-XL) 25 MG 24 hr tablet TAKE 1/2 TABLET(12.5 MG) BY MOUTH DAILY (Patient taking differently: Take 12.5 mg by mouth daily. ) 45 tablet 0 06/23/2019 at 2100  . olmesartan-hydrochlorothiazide (BENICAR HCT) 40-12.5 MG tablet TAKE 1 TABLET BY MOUTH DAILY 90 tablet 1 06/24/2019 at Unknown time  . ondansetron (ZOFRAN ODT) 4 MG disintegrating tablet Take 1 tablet (4 mg total) by mouth every 8 (eight) hours as needed for nausea or vomiting. 20 tablet 0 not used  . oxyCODONE-acetaminophen (PERCOCET) 7.5-325 MG tablet Take 1 tablet by mouth every 4 (four) hours as needed for moderate pain.    06/24/2019 at Unknown time  .  polyethylene glycol powder (MIRALAX) powder Take 17 g by mouth daily as needed for moderate constipation.    Past Week at Unknown time  . primidone (MYSOLINE) 50 MG  tablet TAKE 1 TABLET(50 MG) BY MOUTH AT BEDTIME (Patient taking differently: Take 50 mg by mouth at bedtime. ) 90 tablet 1 06/23/2019 at Unknown time  . rOPINIRole (REQUIP) 0.5 MG tablet Take 1.5 tablets (0.75 mg total) by mouth at bedtime. 140 tablet 1 06/23/2019 at Unknown time  . vitamin B-12 (CYANOCOBALAMIN) 1000 MCG tablet Take 1,000 mcg by mouth daily.   06/24/2019 at Unknown time  . furosemide (LASIX) 20 MG tablet TAKE 1 TABLET BY MOUTH DAILY AS NEEDED (Patient not taking: No sig reported) 90 tablet 1 on hold    SH: Social History   Tobacco Use  . Smoking status: Former Smoker    Packs/day: 0.50    Years: 35.00    Pack years: 17.50    Types: Cigarettes    Quit date: 05/22/2018    Years since quitting: 1.0  . Smokeless tobacco: Never Used  Substance Use Topics  . Alcohol use: Yes    Comment: 1 glass of wine a night  . Drug use: Never    Comment: CBD tincture    MEDS: Prior to Admission medications   Medication Sig Start Date End Date Taking? Authorizing Provider  albuterol (PROVENTIL HFA;VENTOLIN HFA) 108 (90 Base) MCG/ACT inhaler INHALE 2 PUFFS BY MOUTH EVERY 4 TO 6 HOURS AS NEEDED Patient taking differently: Inhale 2 puffs into the lungs every 4 (four) hours as needed for wheezing or shortness of breath.  05/03/18  Yes Henson, Vickie L, NP-C  allopurinol (ZYLOPRIM) 300 MG tablet TAKE 1 TABLET(300 MG) BY MOUTH DAILY Patient taking differently: Take 300 mg by mouth daily.  03/27/19  Yes Henson, Vickie L, NP-C  anastrozole (ARIMIDEX) 1 MG tablet TAKE 1 TABLET(1 MG) BY MOUTH DAILY Patient taking differently: Take 1 mg by mouth daily.  04/21/19  Yes Magrinat, Virgie Dad, MD  atorvastatin (LIPITOR) 20 MG tablet TAKE 1 TABLET BY MOUTH EVERY DAY Patient taking differently: Take 20 mg by mouth daily.  04/22/19  Yes Henson, Vickie L,  NP-C  Biotin (BIOTIN 5000) 5 MG CAPS Take 5 mg by mouth daily.    Yes [provider]  Calcium Carb-Cholecalciferol (CALCIUM 1000 + D PO) Take 1 tablet by mouth daily.   Yes [provider]  colchicine 0.6 MG tablet Take 2 tablets now and 1 tablet an hour later for flare up Patient taking differently: Take 0.6-1.2 mg by mouth daily as needed (gout).  12/20/18  Yes Henson, Vickie L, NP-C  diclofenac Sodium (VOLTAREN) 1 % GEL Apply 4 g topically 4 (four) times daily. As needed for knee pain 03/18/19  Yes Henson, Vickie L, NP-C  diphenhydrAMINE (BENADRYL) 25 MG tablet Take 25 mg by mouth daily as needed for itching.   Yes [provider]  DULoxetine (CYMBALTA) 60 MG capsule Take 1 capsule (60 mg total) by mouth daily. 03/18/19  Yes Henson, Vickie L, NP-C  hydrOXYzine (VISTARIL) 25 MG capsule Take 25 mg by mouth 2 (two) times daily as needed for anxiety.   Yes [provider]  Javier Docker Oil 350 MG CAPS Take 700 mg by mouth daily.   Yes [provider]  levothyroxine (SYNTHROID) 75 MCG tablet Take 1 tablet (75 mcg total) by mouth daily before breakfast. 04/22/19  Yes Henson, Vickie L, NP-C  Magnesium 500 MG CAPS Take 500 mg by mouth daily.   Yes [provider]  Melatonin 5 MG/15ML LIQD Take 10 mg by mouth at bedtime.    Yes [provider]  methocarbamol (ROBAXIN-750) 750  MG tablet Take 1 tablet (750 mg total) by mouth 3 (three) times daily as needed for muscle spasms. 05/31/19  Yes Hargun Spurling, Vista Mink, PA-C  metoprolol succinate (TOPROL-XL) 25 MG 24 hr tablet TAKE 1/2 TABLET(12.5 MG) BY MOUTH DAILY Patient taking differently: Take 12.5 mg by mouth daily.  06/17/19  Yes Henson, Vickie L, NP-C  olmesartan-hydrochlorothiazide (BENICAR HCT) 40-12.5 MG tablet TAKE 1 TABLET BY MOUTH DAILY 04/22/19  Yes Henson, Vickie L, NP-C  ondansetron (ZOFRAN ODT) 4 MG disintegrating tablet Take 1 tablet (4 mg total) by mouth every 8 (eight) hours as needed for nausea or  vomiting. 06/24/19  Yes Tysinger, Camelia Eng, PA-C  oxyCODONE-acetaminophen (PERCOCET) 7.5-325 MG tablet Take 1 tablet by mouth every 4 (four) hours as needed for moderate pain.  06/13/19  Yes [provider]  polyethylene glycol powder (MIRALAX) powder Take 17 g by mouth daily as needed for moderate constipation.    Yes [provider]  primidone (MYSOLINE) 50 MG tablet TAKE 1 TABLET(50 MG) BY MOUTH AT BEDTIME Patient taking differently: Take 50 mg by mouth at bedtime.  04/22/19  Yes Henson, Vickie L, NP-C  rOPINIRole (REQUIP) 0.5 MG tablet Take 1.5 tablets (0.75 mg total) by mouth at bedtime. 06/12/19  Yes Henson, Vickie L, NP-C  vitamin B-12 (CYANOCOBALAMIN) 1000 MCG tablet Take 1,000 mcg by mouth daily.   Yes [provider]  furosemide (LASIX) 20 MG tablet TAKE 1 TABLET BY MOUTH DAILY AS NEEDED Patient not taking: No sig reported 01/23/19   [name], Vickie L, NP-C    ALLERGY: Allergies  Allergen Reactions  . Ampicillin Hives and Other (See Comments)    Severe reaction in February 2017 1.5 month to have hives to go away  . Penicillins Hives and Other (See Comments)    Severe reaction in February 2017 1.5 month to have hives to go away Has patient had a PCN reaction causing immediate rash, facial/tongue/throat swelling, SOB or lightheadedness with hypotension: #  #  #  YES  #  #  #  Has patient had a PCN reaction causing severe rash involving mucus membranes or skin necrosis: No Has patient had a PCN reaction that required hospitalization No Has patient had a PCN reaction occurring within the last 10 years: No   . Gabapentin Swelling    UNSPECIFIED REACTION   . Other Itching    UNSPECIFIED Analgesics  . Codeine Nausea And Vomiting  . Hydrocodone-Acetaminophen Itching  . Hydromorphone Itching    Pt has taken w/o issues    Social History   Tobacco Use  . Smoking status: Former Smoker    Packs/day: 0.50    Years: 35.00    Pack years: 17.50    Types: Cigarettes      Quit date: 05/22/2018    Years since quitting: 1.0  . Smokeless tobacco: Never Used  Substance Use Topics  . Alcohol use: Yes    Comment: 1 glass of wine a night     Family History  Problem Relation Age of Onset  . Atrial fibrillation Mother   . Ulcers Father   . Breast cancer Maternal Aunt   . Lung cancer Maternal Uncle   . Stomach cancer Maternal Grandmother   . Lung cancer Maternal Aunt   . Brain cancer Maternal Aunt   . Prostate cancer Maternal Grandfather   . Stroke Paternal Grandmother   . Tuberculosis Paternal Grandfather   . Colon cancer Neg Hx   . Esophageal cancer Neg Hx   .  Rectal cancer Neg Hx      ROS   Review of Systems  Constitutional: Negative.  Negative for chills and fever.  HENT: Negative.   Eyes: Negative.   Respiratory: Negative.   Cardiovascular: Negative.   Gastrointestinal: Positive for abdominal pain (suprapubic, LLQ, RLQ) and nausea. Negative for vomiting.  Genitourinary: Negative.  Negative for dysuria, frequency and urgency.       + urine odor  Musculoskeletal: Positive for back pain, falls and myalgias.  Skin: Negative.   Neurological: Positive for weakness. Negative for dizziness, tingling, tremors, sensory change, speech change, focal weakness, loss of consciousness and headaches.  Psychiatric/Behavioral: The patient is nervous/anxious.     Exam   Vitals:   06/25/19 0508 06/25/19 0650  BP: (!) 117/51 108/68  Pulse: 89 84  Resp: 18 17  Temp:  97.8 F (36.6 C)  SpO2: 95% 96%   General appearance: WDWN, NAD Eyes: No scleral injection Cardiovascular: Regular rate and rhythm without murmurs, rubs, gallops. No edema or variciosities. Distal pulses normal. Pulmonary: Effort normal, non-labored breathing Musculoskeletal:     Muscle tone upper extremities: Normal    Muscle tone lower extremities: Normal    Motor exam: Upper Extremities Deltoid Bicep Tricep Grip  Right 5/5 5/5 5/5 5/5  Left 5/5 5/5 5/5 5/5   Lower Extremity IP  Quad PF DF EHL  Right 5/5 5/5 5/5 5/5 5/5  Left 5/5 5/5 5/5 5/5 5/5   Neurological Mental Status:    - Patient is awake, alert, oriented to person, place, month, year, and situation    - Patient is able to give a clear and coherent history.    - No signs of aphasia or neglect Cranial Nerves    - II: Visual Fields are full. PERRL    - III/IV/VI: EOMI without ptosis or diploplia.     - V: Facial sensation is grossly normal    - VII: Facial movement is symmetric.     - VIII: hearing is intact to voice    - X: Uvula elevates symmetrically    - XI: Shoulder shrug is symmetric.    - XII: tongue is midline without atrophy or fasciculations.  Sensory: Sensation grossly intact to LT  Incision:  Well approximated C/d/i No redness, warmth, tenderness No drainage  Results - Imaging/Labs   Results for orders placed or performed during the hospital encounter of 06/24/19 (from the past 48 hour(s))  CBC     Status: Abnormal   Collection Time: 06/24/19 10:57 PM  Result Value Ref Range   WBC 12.2 (H) 4.0 - 10.5 K/uL   RBC 3.08 (L) 3.87 - 5.11 MIL/uL   Hemoglobin 9.7 (L) 12.0 - 15.0 g/dL   HCT 31.6 (L) 36.0 - 46.0 %   MCV 102.6 (H) 80.0 - 100.0 fL   MCH 31.5 26.0 - 34.0 pg   MCHC 30.7 30.0 - 36.0 g/dL   RDW 14.4 11.5 - 15.5 %   Platelets 342 150 - 400 K/uL   nRBC 0.0 0.0 - 0.2 %    Comment: Performed at [facility], [place] [address], [place], [place] [address]  Comprehensive metabolic panel     Status: Abnormal   Collection Time: 06/24/19 10:57 PM  Result Value Ref Range   Sodium 133 (L) 135 - 145 mmol/L   Potassium 4.6 3.5 - 5.1 mmol/L   Chloride 97 (L) 98 - 111 mmol/L   CO2 24 22 - 32 mmol/L   Glucose, Bld 132 (H)  70 - 99 mg/dL    Comment: Glucose reference range applies only to samples taken after fasting for at least 8 hours.   BUN 70 (H) 8 - 23 mg/dL   Creatinine, Ser 2.83 (H) 0.44 - 1.00 mg/dL   Calcium 9.8 8.9 - 10.3 mg/dL   Total Protein 7.5 6.5 - 8.1  g/dL   Albumin 3.3 (L) 3.5 - 5.0 g/dL   AST 39 15 - 41 U/L   ALT 45 (H) 0 - 44 U/L   Alkaline Phosphatase 104 38 - 126 U/L   Total Bilirubin 0.8 0.3 - 1.2 mg/dL   GFR calc non Af Amer 16 (L) >60 mL/min   GFR calc Af Amer 19 (L) >60 mL/min   Anion gap 12 5 - 15    Comment: Performed at [facility], [place] [address] [place], [place] [address]  SARS CORONAVIRUS 2 (TAT 6-24 HRS) Nasopharyngeal Nasopharyngeal Swab     Status: None   Collection Time: 06/24/19 10:57 PM   Specimen: Nasopharyngeal Swab  Result Value Ref Range   SARS Coronavirus 2 NEGATIVE NEGATIVE    Comment: (NOTE) SARS-CoV-2 target nucleic acids are NOT DETECTED. The SARS-CoV-2 RNA is generally detectable in upper and lower respiratory specimens during the acute phase of infection. Negative results do not preclude SARS-CoV-2 infection, do not rule out co-infections with other pathogens, and should not be used as the sole basis for treatment or other patient management decisions. Negative results must be combined with clinical observations, patient history, and epidemiological information. The expected result is Negative. Fact Sheet for Patients: SugarRoll.be Fact Sheet for Healthcare Providers: https://www.woods-mathews.com/ This test is not yet approved or cleared by the [place] FDA and  has been authorized for detection and/or diagnosis of SARS-CoV-2 by FDA under an Emergency Use Authorization (EUA). This EUA will remain  in effect (meaning this test can be used) for the duration of the COVID-19 declaration under Section 56 4(b)(1) of the Act, 21 U.S.C. section 360bbb-3(b)(1), unless the authorization is terminated or revoked sooner. Performed at [facility], [place] [address], [place], [place] [address]   C-reactive protein     Status: Abnormal   Collection Time: 06/25/19  4:18 AM  Result Value Ref Range   CRP 27.3 (H) <1.0 mg/dL    Comment:  Performed at [facility] [address] [place], [place] [address]  Sedimentation rate     Status: Abnormal   Collection Time: 06/25/19  4:18 AM  Result Value Ref Range   Sed Rate 136 (H) 0 - 22 mm/hr    Comment: Performed at [facility] [address] [place], [place] [address]  Lactic acid, plasma     Status: None   Collection Time: 06/25/19  4:18 AM  Result Value Ref Range   Lactic Acid, Venous 0.9 0.5 - 1.9 mmol/L    Comment: Performed at [facility] [address] [place], [place] [address]  Sodium, urine, random     Status: None   Collection Time: 06/25/19  4:33 AM  Result Value Ref Range   Sodium, Ur 70 mmol/L    Comment: Performed at [facility] [address], [place], [place] [address]  Creatinine, urine, random     Status: None   Collection Time: 06/25/19  4:33 AM  Result Value Ref Range   Creatinine, Urine 169.18 mg/dL    Comment: Performed at [facility] [address],  [place], [place] [address]  Urinalysis, Routine w reflex microscopic     Status: Abnormal   Collection Time: 06/25/19  4:33 AM  Result Value Ref Range   Color, Urine YELLOW YELLOW   APPearance CLOUDY (A) CLEAR   Specific Gravity, Urine 1.020 1.005 - 1.030   pH 5.0 5.0 - 8.0   Glucose, UA NEGATIVE NEGATIVE mg/dL   Hgb urine dipstick NEGATIVE NEGATIVE   Bilirubin Urine SMALL (A) NEGATIVE   Ketones, ur NEGATIVE NEGATIVE mg/dL   Protein, ur NEGATIVE NEGATIVE mg/dL   Nitrite NEGATIVE NEGATIVE   Leukocytes,Ua MODERATE (A) NEGATIVE   RBC / HPF 6-10 0 - 5 RBC/hpf   WBC, UA 21-50 0 - 5 WBC/hpf   Bacteria, UA FEW (A) NONE SEEN   Squamous Epithelial / LPF 21-50 0 - 5    Comment: Performed at [facility] [address], [place],  [address]    MR LUMBAR SPINE WO CONTRAST  Result Date: 06/25/2019 CLINICAL DATA:  Initial evaluation for acute left leg weakness. Recent surgery on 05/30/2019. EXAM: MRI LUMBAR SPINE WITHOUT CONTRAST TECHNIQUE: Multiplanar,  multisequence MR imaging of the lumbar spine was performed. No intravenous contrast was administered. COMPARISON:  Previous MRI from 03/31/2019. FINDINGS: Segmentation: Standard. Lowest well-formed disc space labeled the L5-S1 level. Alignment: Trace retrolisthesis of L1 on L2. Alignment otherwise normal with preservation of the normal lumbar lordosis. Vertebrae: Postoperative changes from recent extension of posterior decompression and fusion to the L2-3 level, with sequelae of prior PLIF now seen at this level. There is prominent marrow edema within the L2 and L3 vertebral bodies. Edema extends into the adjacent psoas musculature bilaterally, left greater than right (series 10, image 16). While these findings may be postoperative in nature, appearance raises the concern for possible infection/osteomyelitis. There is question of a circumferential epidural collection at the level of L2-3 (series 10, image 15), difficult to see given adjacent susceptibility artifact. Compression of the thecal sac with persistent fairly severe spinal stenosis. Slight inferior migration of epidural collection within the ventral epidural space to the level of L3 (series 10, image 19). Elsewhere, the remainder of the fusion extending from L3 through S1 is stable in appearance. Vertebral body height maintained without acute or interval fracture. No discrete osseous lesions. No other abnormal marrow edema. Conus medullaris and cauda equina: Conus extends to the superior aspect of L1. Partially visualized conus and distal cord normal in appearance. Proximal nerve roots of the cauda equina somewhat irregular and undulating superior to the L2-3 level secondary to stenosis. Paraspinal and other soft tissues: Postoperative changes from recent posterior decompression with fusion at L2-3. Diffuse edema seen throughout the posterior paraspinous musculature. There is a residual collection along the mid posterior soft tissues extending from  approximately L3-4 through L5-S1. This measures approximately 5.0 x 4.7 x 7.4 cm (series 10, image 29). Collection is heterogeneous with a few scattered internal fluid-fluid levels, which could reflect complex postoperative seroma or hematoma. Superimposed infection not excluded. Additional smaller more simple appearing collection within the subcutaneous fat along the midline incision measures 2.0 x 1.6 x 4.3 cm (series 10, image 28). Edema within the left greater than right psoas musculature adjacent to the L2-3 interspace as above. No definite loculated collection within either psoas muscle, although evaluation limited given lack of IV contrast. Disc levels: T12-L1: Unremarkable. L1-2: Mild diffuse disc bulge with disc desiccation. Disc bulging eccentric to the left. Bilateral facet hypertrophy. Progressive mild spinal stenosis. Mild left L1 foraminal narrowing. No  significant right foraminal stenosis. L2-3: Postoperative changes from interval decompressive laminectomy with facetectomy with posterior and interbody fusion. Suspected circumferential epidural collection compresses the thecal sac. Persistent severe spinal stenosis, with the thecal sac measuring approximately 7 mm in AP I diameter at its most narrow point. Foramina are grossly patent. L3-4: Prior PLIF. Mild spinal stenosis related to the posterior soft tissue collection. Foramina remain grossly patent. L4-5: Prior PLIF. Mild to moderate spinal stenosis related to the posterior soft tissue collection. Foramina remain patent. L5-S1: Prior PLIF. Thecal sac patency grossly stable. No new foraminal narrowing. IMPRESSION: 1. Postoperative changes from recent extension of posterior decompression and fusion to the L2-3 level. Prominent marrow edema within the L2 and L3 vertebral bodies with soft tissue edema within the adjacent psoas musculature as above. While these findings may in part be postoperative in nature, appearance raises the concern for possible  infection/osteomyelitis. Correlation with symptomatology and laboratory values recommended. Additionally, follow-up examination with postcontrast imaging may be helpful for further evaluation as warranted. 2. Persistent severe stenosis at the level of L2-3 with question circumferential epidural collection as above. Finding difficult to be certain given adjacent susceptibility artifact. Again, postcontrast imaging may be helpful. Additionally, correlation with dedicated CT with contrast could also be performed as well. 3. Approximate 5.0 x 4.7 x 7.4 cm heterogeneous collection within the posterior paraspinous soft tissues extending from L3 through L5-S1. Differential considerations include a postoperative hematoma versus complicated seroma. Superimposed infection could be considered in the correct clinical setting. Electronically Signed   By: Jeannine Boga M.D.   On: 06/25/2019 02:56   CT RENAL STONE STUDY  Result Date: 06/24/2019 CLINICAL DATA:  Atypical back pain EXAM: CT ABDOMEN AND PELVIS WITHOUT CONTRAST TECHNIQUE: Multidetector CT imaging of the abdomen and pelvis was performed following the standard protocol without IV contrast. COMPARISON:  CT 02/20/2018 FINDINGS: Lower chest: Lung bases demonstrate no acute consolidation or effusion. Heart size within normal limits. Hepatobiliary: No focal liver abnormality is seen. No gallstones, gallbladder wall thickening, or biliary dilatation. Pancreas: Unremarkable. No pancreatic ductal dilatation or surrounding inflammatory changes. Spleen: Normal in size without focal abnormality. Adrenals/Urinary Tract: Adrenal glands are normal. Kidneys show no hydronephrosis. Probable cyst within the mid to lower left kidney. No ureteral stone is seen. The bladder is unremarkable. Stomach/Bowel: Stomach is nonenlarged. No dilated small bowel. Postsurgical changes of the right colon. No bowel wall thickening. Scattered diverticular disease without acute inflammatory  process. Vascular/Lymphatic: Extensive aortic atherosclerosis. No aneurysm. No significant adenopathy. Reproductive: Status post hysterectomy. No adnexal masses. Other: Negative for free air or free fluid. Musculoskeletal: Posterior fusion changes L2 through S1 with interbody devices at L2-L3, L3-L4, L4-L5 and L5-S1. Breach of the superior endplate by fixating screws on both sides at L2 with some lucency surrounding the screws. IMPRESSION: 1. Negative for hydronephrosis or ureteral stone. 2. Diverticular disease of the colon without acute inflammatory process 3. Post fusion changes of the lumbar spine L2 through S1. Fixating screws at L2 breech the superior endplate on both sides and there appears to be some lucency surrounding the fixating screws. Hardware otherwise appears intact. Electronically Signed   By: Donavan Foil M.D.   On: 06/24/2019 22:44    Impression/Plan   74 y.o. female approximately 3 weeks s/p L2 decompression with extension of prior fusion to L2-S1, admitted with possible UTI, AKI. She is neurologically intact with great strength in BLE. Incision looks good. Current pain is far different than her usual back pain. MRI reviewed with Dr  Nundkumar. We are not convinced the MRI findings represent infection, this is all likely just expected post operative findings. Would rec continued medical management of AKI, treat possible UTI per TH. She can be discharged from our perspective once medicine gives her clearance. I have allowed her to have a normal diet as there is no indication for NS intervention.   Please call for any concerns.  Ferne Reus, PA-C [facility] [employer]

## 2019-06-25 NOTE — H&P (Signed)
History and Physical    [name] [id number] DOB: December 18, 1945 DOA: 06/24/2019  PCP: Brandi Rm, NP-C   Patient coming from: Home   Chief Complaint: Back pain, left leg weakness, abnormal blood work   HPI: Brandi Stephens is a 74 y.o. female with medical history significant for hypertension, hypothyroidism, COPD, chronic kidney disease stage III, and lumbar spinal stenosis status post recent decompression and fusion, now presenting to the emergency department with increased back pain, left leg weakness, and abnormal outpatient blood work.  Patient reports that she underwent low back decompression and fusion approximately 1 month ago with additional procedure earlier this month, has developed worsening low back pain with radiation around towards the groin bilaterally.  She is also experienced some left leg weakness that has resulted in some falls.  She denies incontinence or paresthesias.  She has not noted any fevers or chills associated with this.  She had blood work performed and was directed to the ED due to elevation in her BUN and creatinine.  ED Course: Upon arrival to the ED, patient is found to be afebrile, saturating well on room air, and with systolic blood pressure [id number].  Chemistry panel notable for BUN of 70 and creatinine of 2.83, up from 1.95 a month ago and 1.48 the month before that.  CBC features a leukocytosis to 12,200 and improving anemia.  CT of the abdomen pelvis is negative for hydronephrosis or stone but notable for postoperative changes involving the lumbar spine and concerning for possible lucency around screws.  MRI of the lumbar spine is also notable for postoperative changes with prominent vertebral body marrow edema, adjacent psoas musculature edema, and question of epidural fluid collection and paraspinous soft tissue hematoma or seroma.  Neurosurgery was consulted by the ED physician and recommended medical admission, no antibiotics for now, but to obtain  blood cultures and inflammatory markers.  Review of Systems:  All other systems reviewed and apart from HPI, are negative.  Past Medical History:  Diagnosis Date  . AIN (anal intraepithelial neoplasia) anal canal   . Anemia    with pregnancy  . Anxiety   . Arthritis    knees, lower back, knees  . Chronic constipation   . Coarse tremors    Essential  . COPD (chronic obstructive pulmonary disease) ([place])    2017 chest CT, history of  . Degenerative lumbar spinal stenosis   . Depression   . Drug dependency ([place])   . Elevated PTHrP level 05/09/2019  . GERD (gastroesophageal reflux disease)    pt denies  . Gout   . Grade I diastolic dysfunction 0000000   Noted ECHO  . Hemorrhoids   . History of adenomatous polyp of colon   . History of basal cell carcinoma (BCC) excision 2015   nose  . History of sepsis 05/14/2014   post lumbar surgery  . History of shingles    x2  . Hyperlipidemia   . Hypertension   . Hypothyroidism   . Irregular heart beat    history of while undergoing radiation  . Malignant neoplasm of upper-inner quadrant of left breast in female, estrogen receptor positive [facility]) oncologist-  dr Jana Hakim--  per lov in epic no recurrence   dx 07-13-2015  Left breast invasive ductal carcinoma, Stage IA, Grade 1 (TXN0),  09-16-2015  s/p  left breast lumpectomy with sln bx's,  completed radiation 11-18-2015,  started antiestrogen therapy 12-14-2015  . Neuropathy    Left foot and back of left  leg  . Personal history of radiation therapy    completed 11-18-2015  left breast  . Pneumonia   . PONV (postoperative nausea and vomiting)   . Prediabetes    diet controlled, no med  . Restless leg syndrome   . Seasonal allergies   . Seizure ([place]) 05/2015   due to sepsis, only one time episode  . Tremor   . Wears partial dentures    lower     Past Surgical History:  Procedure Laterality Date  . ABDOMINAL HYSTERECTOMY  1985  . BASAL CELL CARCINOMA EXCISION  10/15   nose   . BREAST BIOPSY Right 08/24/2015  . BREAST BIOPSY Right 07/13/2015  . BREAST BIOPSY Left 07/13/2015  . BREAST EXCISIONAL BIOPSY Left 1972  . BREAST LUMPECTOMY Left   . BREAST LUMPECTOMY WITH RADIOACTIVE SEED AND SENTINEL LYMPH NODE BIOPSY Left 09/16/2015   Procedure: BREAST LUMPECTOMY WITH RADIOACTIVE SEED AND SENTINEL LYMPH NODE BIOPSY;  Surgeon: Stark Klein, MD;  Location: [facility];  Service: General;  Laterality: Left;  . BREAST REDUCTION SURGERY Bilateral 1998  . CATARACT EXTRACTION W/ INTRAOCULAR LENS IMPLANT Left 2016  . [place]   hallo  . COLONOSCOPY  01/2013   polyps  . EYE SURGERY Left 2016  . HAND SURGERY Left 02-19-2002   dr Fredna Dow  @[facility]    repair collateral ligament/ MPJ left thumb  . [place]  2008  . HIGH RESOLUTION ANOSCOPY N/A 02/28/2018   Procedure: HIGH RESOLUTION ANOSCOPY WITH BIOPSY;  Surgeon: Leighton Ruff, MD;  Location: [facility];  Service: General;  Laterality: N/A;  . KNEE ARTHROSCOPY Right 2002  . LUMBAR SPINE SURGERY  03-20-2015   dr Kathyrn Sheriff   fusion L4-5, L5-S1  . MOUTH SURGERY  07/14/2018   2 infected teeth removed  . MULTIPLE TOOTH EXTRACTIONS     with bone grafting lower bottom right and left  . REDUCTION MAMMAPLASTY Bilateral   . RIGHT COLECTOMY  09-10-2003   dr Margot Chimes @[facility]    multiple colon polyps (per path tubular adenoma's, hyperplastic , benign appendix, benign two lymph nodes  . ROTATOR CUFF REPAIR Left 04/2016  . TONSILLECTOMY  1969  . TUBAL LIGATION Bilateral 1982     reports that she quit smoking about 13 months ago. Her smoking use included cigarettes. She has a 17.50 pack-year smoking history. She has never used smokeless tobacco. She reports current alcohol use. She reports that she does not use drugs.  Allergies  Allergen Reactions  . Ampicillin Hives and Other (See Comments)    Severe reaction in February 2017 1.5 month to have hives to go away  . Penicillins Hives  and Other (See Comments)    Severe reaction in February 2017 1.5 month to have hives to go away Has patient had a PCN reaction causing immediate rash, facial/tongue/throat swelling, SOB or lightheadedness with hypotension: #  #  #  YES  #  #  #  Has patient had a PCN reaction causing severe rash involving mucus membranes or skin necrosis: No Has patient had a PCN reaction that required hospitalization No Has patient had a PCN reaction occurring within the last 10 years: No   . Gabapentin Swelling    UNSPECIFIED REACTION   . Other Itching    UNSPECIFIED Analgesics  . Codeine Nausea And Vomiting  . Hydrocodone-Acetaminophen Itching  . Hydromorphone Itching    Pt has taken w/o issues    Family History  Problem Relation Age  of Onset  . Atrial fibrillation Mother   . Ulcers Father   . Breast cancer Maternal Aunt   . Lung cancer Maternal Uncle   . Stomach cancer Maternal Grandmother   . Lung cancer Maternal Aunt   . Brain cancer Maternal Aunt   . Prostate cancer Maternal Grandfather   . Stroke Paternal Grandmother   . Tuberculosis Paternal Grandfather   . Colon cancer Neg Hx   . Esophageal cancer Neg Hx   . Rectal cancer Neg Hx      Prior to Admission medications   Medication Sig Start Date End Date Taking? Authorizing Provider  albuterol (PROVENTIL HFA;VENTOLIN HFA) 108 (90 Base) MCG/ACT inhaler INHALE 2 PUFFS BY MOUTH EVERY 4 TO 6 HOURS AS NEEDED Patient taking differently: Inhale 2 puffs into the lungs every 4 (four) hours as needed for wheezing or shortness of breath.  05/03/18  Yes Henson, Vickie L, NP-C  allopurinol (ZYLOPRIM) 300 MG tablet TAKE 1 TABLET(300 MG) BY MOUTH DAILY Patient taking differently: Take 300 mg by mouth daily.  03/27/19  Yes Henson, Vickie L, NP-C  anastrozole (ARIMIDEX) 1 MG tablet TAKE 1 TABLET(1 MG) BY MOUTH DAILY Patient taking differently: Take 1 mg by mouth daily.  04/21/19  Yes Magrinat, Virgie Dad, MD  atorvastatin (LIPITOR) 20 MG tablet TAKE 1  TABLET BY MOUTH EVERY DAY Patient taking differently: Take 20 mg by mouth daily.  04/22/19  Yes Henson, Vickie L, NP-C  Biotin (BIOTIN 5000) 5 MG CAPS Take 5 mg by mouth daily.    Yes [provider]  Calcium Carb-Cholecalciferol (CALCIUM 1000 + D PO) Take 1 tablet by mouth daily.   Yes [provider]  colchicine 0.6 MG tablet Take 2 tablets now and 1 tablet an hour later for flare up Patient taking differently: Take 0.6-1.2 mg by mouth daily as needed (gout).  12/20/18  Yes Henson, Vickie L, NP-C  diclofenac Sodium (VOLTAREN) 1 % GEL Apply 4 g topically 4 (four) times daily. As needed for knee pain 03/18/19  Yes Henson, Vickie L, NP-C  diphenhydrAMINE (BENADRYL) 25 MG tablet Take 25 mg by mouth daily as needed for itching.   Yes [provider]  DULoxetine (CYMBALTA) 60 MG capsule Take 1 capsule (60 mg total) by mouth daily. 03/18/19  Yes Henson, Vickie L, NP-C  hydrOXYzine (VISTARIL) 25 MG capsule Take 25 mg by mouth 2 (two) times daily as needed for anxiety.   Yes [provider]  Javier Docker Oil 350 MG CAPS Take 700 mg by mouth daily.   Yes [provider]  levothyroxine (SYNTHROID) 75 MCG tablet Take 1 tablet (75 mcg total) by mouth daily before breakfast. 04/22/19  Yes Henson, Vickie L, NP-C  Magnesium 500 MG CAPS Take 500 mg by mouth daily.   Yes [provider]  Melatonin 5 MG/15ML LIQD Take 10 mg by mouth at bedtime.    Yes [provider]  methocarbamol (ROBAXIN-750) 750 MG tablet Take 1 tablet (750 mg total) by mouth 3 (three) times daily as needed for muscle spasms. 05/31/19  Yes Costella, Vista Mink, PA-C  metoprolol succinate (TOPROL-XL) 25 MG 24 hr tablet TAKE 1/2 TABLET(12.5 MG) BY MOUTH DAILY Patient taking differently: Take 12.5 mg by mouth daily.  06/17/19  Yes Henson, Vickie L, NP-C  olmesartan-hydrochlorothiazide (BENICAR HCT) 40-12.5 MG tablet TAKE 1 TABLET BY MOUTH DAILY 04/22/19  Yes Henson, Vickie L, NP-C  ondansetron (ZOFRAN  ODT) 4 MG disintegrating tablet Take 1 tablet (4 mg total) by  mouth every 8 (eight) hours as needed for nausea or vomiting. 06/24/19  Yes Tysinger, Camelia Eng, PA-C  oxyCODONE-acetaminophen (PERCOCET) 7.5-325 MG tablet Take 1 tablet by mouth every 4 (four) hours as needed for moderate pain.  06/13/19  Yes [provider]  polyethylene glycol powder (MIRALAX) powder Take 17 g by mouth daily as needed for moderate constipation.    Yes [provider]  primidone (MYSOLINE) 50 MG tablet TAKE 1 TABLET(50 MG) BY MOUTH AT BEDTIME Patient taking differently: Take 50 mg by mouth at bedtime.  04/22/19  Yes Henson, Vickie L, NP-C  rOPINIRole (REQUIP) 0.5 MG tablet Take 1.5 tablets (0.75 mg total) by mouth at bedtime. 06/12/19  Yes Henson, Vickie L, NP-C  vitamin B-12 (CYANOCOBALAMIN) 1000 MCG tablet Take 1,000 mcg by mouth daily.   Yes [provider]  furosemide (LASIX) 20 MG tablet TAKE 1 TABLET BY MOUTH DAILY AS NEEDED Patient not taking: No sig reported 01/23/19   Brandi Rm, NP-C    Physical Exam: Vitals:   06/25/19 0100 06/25/19 0217 06/25/19 0300 06/25/19 0401  BP: (!) 98/48 (!) 97/57 (!) 92/52 (!) 110/57  Pulse: 87 92 78 75  Resp:  20 19 17   Temp:      TempSrc:      SpO2: 96% 95% 93% 93%  Weight:      Height:         Constitutional: NAD, calm  Eyes: PERTLA, lids and conjunctivae normal ENMT: Mucous membranes are moist. Posterior pharynx clear of any exudate or lesions.   Neck: normal, supple, no masses, no thyromegaly Respiratory: clear to auscultation bilaterally, no wheezing, no crackles. No accessory muscle use.  Cardiovascular: S1 & S2 heard, regular rate and rhythm. No extremity edema.   Abdomen: No distension, no tenderness, soft. Bowel sounds active.  Musculoskeletal: no clubbing / cyanosis. No joint deformity upper and lower extremities.   Skin: no significant rashes, lesions, ulcers. Warm, dry, well-perfused. Neurologic: no facial asymmetry. Sensation to  light touch intact. Moving all extremities, strength LE strength assessment limited by pain with movement.  Psychiatric: Alert and oriented x 3. Pleasant and cooperative.    Labs and Imaging on Admission: I have personally reviewed following labs and imaging studies  CBC: Recent Labs  Lab 06/24/19 1430 06/24/19 2257  WBC 12.7* 12.2*  NEUTROABS 10.0*  --   HGB 9.7* 9.7*  HCT 30.1* 31.6*  MCV 97 102.6*  PLT 372 [id number]   Basic Metabolic Panel: Recent Labs  Lab 06/24/19 1430 06/24/19 2257  NA 132* 133*  K 5.2 4.6  CL 99 97*  CO2 24 24  GLUCOSE 148* 132*  BUN 68* 70*  CREATININE 3.11* 2.83*  CALCIUM 10.3 9.8   GFR: Estimated Creatinine Clearance: 23.7 mL/min (A) (by C-G formula based on SCr of 2.83 mg/dL (H)). Liver Function Tests: Recent Labs  Lab 06/24/19 1430 06/24/19 2257  AST 44* 39  ALT 46* 45*  ALKPHOS 126* 104  BILITOT <0.2 0.8  PROT 6.5 7.5  ALBUMIN 3.8 3.3*   No results for input(s): LIPASE, AMYLASE in the last 168 hours. No results for input(s): AMMONIA in the last 168 hours. Coagulation Profile: No results for input(s): INR, PROTIME in the last 168 hours. Cardiac Enzymes: No results for input(s): CKTOTAL, CKMB, CKMBINDEX, TROPONINI in the last 168 hours. BNP (last 3 results) No results for input(s): PROBNP in the last 8760 hours. HbA1C: No results for input(s): HGBA1C in the last 72 hours. CBG: No results for input(s):  GLUCAP in the last 168 hours. Lipid Profile: No results for input(s): CHOL, HDL, LDLCALC, TRIG, CHOLHDL, LDLDIRECT in the last 72 hours. Thyroid Function Tests: No results for input(s): TSH, T4TOTAL, FREET4, T3FREE, THYROIDAB in the last 72 hours. Anemia Panel: Recent Labs    06/24/19 1442  IRON 29   Urine analysis:    Component Value Date/Time   COLORURINE YELLOW 03/02/2019 0024   APPEARANCEUR CLEAR 03/02/2019 0024   LABSPEC 1.025 06/24/2019 1426   PHURINE 5.0 03/02/2019 0024   GLUCOSEU NEGATIVE 03/02/2019 0024   HGBUR  NEGATIVE 03/02/2019 0024   BILIRUBINUR moderate (A) 06/24/2019 1426   BILIRUBINUR n 11/25/2015 1712   KETONESUR negative 06/24/2019 1426   KETONESUR NEGATIVE 03/02/2019 0024   PROTEINUR trace (A) 06/24/2019 1426   PROTEINUR NEGATIVE 03/02/2019 0024   UROBILINOGEN negative 11/25/2015 1712   NITRITE Negative 06/24/2019 1426   NITRITE NEGATIVE 03/02/2019 0024   LEUKOCYTESUR Small (1+) (A) 06/24/2019 1426   LEUKOCYTESUR NEGATIVE 03/02/2019 0024   Sepsis Labs: @LABRCNTIP (procalcitonin:4,lacticidven:4) )No results found for this or any previous visit (from the past 240 hour(s)).   Radiological Exams on Admission: MR LUMBAR SPINE WO CONTRAST  Result Date: 06/25/2019 CLINICAL DATA:  Initial evaluation for acute left leg weakness. Recent surgery on 05/30/2019. EXAM: MRI LUMBAR SPINE WITHOUT CONTRAST TECHNIQUE: Multiplanar, multisequence MR imaging of the lumbar spine was performed. No intravenous contrast was administered. COMPARISON:  Previous MRI from 03/31/2019. FINDINGS: Segmentation: Standard. Lowest well-formed disc space labeled the L5-S1 level. Alignment: Trace retrolisthesis of L1 on L2. Alignment otherwise normal with preservation of the normal lumbar lordosis. Vertebrae: Postoperative changes from recent extension of posterior decompression and fusion to the L2-3 level, with sequelae of prior PLIF now seen at this level. There is prominent marrow edema within the L2 and L3 vertebral bodies. Edema extends into the adjacent psoas musculature bilaterally, left greater than right (series 10, image 16). While these findings may be postoperative in nature, appearance raises the concern for possible infection/osteomyelitis. There is question of a circumferential epidural collection at the level of L2-3 (series 10, image 15), difficult to see given adjacent susceptibility artifact. Compression of the thecal sac with persistent fairly severe spinal stenosis. Slight inferior migration of epidural  collection within the ventral epidural space to the level of L3 (series 10, image 19). Elsewhere, the remainder of the fusion extending from L3 through S1 is stable in appearance. Vertebral body height maintained without acute or interval fracture. No discrete osseous lesions. No other abnormal marrow edema. Conus medullaris and cauda equina: Conus extends to the superior aspect of L1. Partially visualized conus and distal cord normal in appearance. Proximal nerve roots of the cauda equina somewhat irregular and undulating superior to the L2-3 level secondary to stenosis. Paraspinal and other soft tissues: Postoperative changes from recent posterior decompression with fusion at L2-3. Diffuse edema seen throughout the posterior paraspinous musculature. There is a residual collection along the mid posterior soft tissues extending from approximately L3-4 through L5-S1. This measures approximately 5.0 x 4.7 x 7.4 cm (series 10, image 29). Collection is heterogeneous with a few scattered internal fluid-fluid levels, which could reflect complex postoperative seroma or hematoma. Superimposed infection not excluded. Additional smaller more simple appearing collection within the subcutaneous fat along the midline incision measures 2.0 x 1.6 x 4.3 cm (series 10, image 28). Edema within the left greater than right psoas musculature adjacent to the L2-3 interspace as above. No definite loculated collection within either psoas muscle, although evaluation limited given lack  of IV contrast. Disc levels: T12-L1: Unremarkable. L1-2: Mild diffuse disc bulge with disc desiccation. Disc bulging eccentric to the left. Bilateral facet hypertrophy. Progressive mild spinal stenosis. Mild left L1 foraminal narrowing. No significant right foraminal stenosis. L2-3: Postoperative changes from interval decompressive laminectomy with facetectomy with posterior and interbody fusion. Suspected circumferential epidural collection compresses the  thecal sac. Persistent severe spinal stenosis, with the thecal sac measuring approximately 7 mm in AP I diameter at its most narrow point. Foramina are grossly patent. L3-4: Prior PLIF. Mild spinal stenosis related to the posterior soft tissue collection. Foramina remain grossly patent. L4-5: Prior PLIF. Mild to moderate spinal stenosis related to the posterior soft tissue collection. Foramina remain patent. L5-S1: Prior PLIF. Thecal sac patency grossly stable. No new foraminal narrowing. IMPRESSION: 1. Postoperative changes from recent extension of posterior decompression and fusion to the L2-3 level. Prominent marrow edema within the L2 and L3 vertebral bodies with soft tissue edema within the adjacent psoas musculature as above. While these findings may in part be postoperative in nature, appearance raises the concern for possible infection/osteomyelitis. Correlation with symptomatology and laboratory values recommended. Additionally, follow-up examination with postcontrast imaging may be helpful for further evaluation as warranted. 2. Persistent severe stenosis at the level of L2-3 with question circumferential epidural collection as above. Finding difficult to be certain given adjacent susceptibility artifact. Again, postcontrast imaging may be helpful. Additionally, correlation with dedicated CT with contrast could also be performed as well. 3. Approximate 5.0 x 4.7 x 7.4 cm heterogeneous collection within the posterior paraspinous soft tissues extending from L3 through L5-S1. Differential considerations include a postoperative hematoma versus complicated seroma. Superimposed infection could be considered in the correct clinical setting. Electronically Signed   By: Jeannine Boga M.D.   On: 06/25/2019 02:56   CT RENAL STONE STUDY  Result Date: 06/24/2019 CLINICAL DATA:  Atypical back pain EXAM: CT ABDOMEN AND PELVIS WITHOUT CONTRAST TECHNIQUE: Multidetector CT imaging of the abdomen and pelvis was  performed following the standard protocol without IV contrast. COMPARISON:  CT 02/20/2018 FINDINGS: Lower chest: Lung bases demonstrate no acute consolidation or effusion. Heart size within normal limits. Hepatobiliary: No focal liver abnormality is seen. No gallstones, gallbladder wall thickening, or biliary dilatation. Pancreas: Unremarkable. No pancreatic ductal dilatation or surrounding inflammatory changes. Spleen: Normal in size without focal abnormality. Adrenals/Urinary Tract: Adrenal glands are normal. Kidneys show no hydronephrosis. Probable cyst within the mid to lower left kidney. No ureteral stone is seen. The bladder is unremarkable. Stomach/Bowel: Stomach is nonenlarged. No dilated small bowel. Postsurgical changes of the right colon. No bowel wall thickening. Scattered diverticular disease without acute inflammatory process. Vascular/Lymphatic: Extensive aortic atherosclerosis. No aneurysm. No significant adenopathy. Reproductive: Status post hysterectomy. No adnexal masses. Other: Negative for free air or free fluid. Musculoskeletal: Posterior fusion changes L2 through S1 with interbody devices at L2-L3, L3-L4, L4-L5 and L5-S1. Breach of the superior endplate by fixating screws on both sides at L2 with some lucency surrounding the screws. IMPRESSION: 1. Negative for hydronephrosis or ureteral stone. 2. Diverticular disease of the colon without acute inflammatory process 3. Post fusion changes of the lumbar spine L2 through S1. Fixating screws at L2 breech the superior endplate on both sides and there appears to be some lucency surrounding the fixating screws. Hardware otherwise appears intact. Electronically Signed   By: Donavan Foil M.D.   On: 06/24/2019 22:44    Assessment/Plan   1. Post-operative back pain and LLE weakness; ?infection  - Presents with increased  back pain and left leg weakness after recent lumbar decompression and fusion, and MRI findings concerning for possible infection    - Neurosurgery consulted by ED physician and recommends holding antibiotics for now while obtaining cultures and checking inflammatory markers   - Continue pain-control, keep NPO pending surgeon's eval, check inflammatory markers    2. Acute kidney injury superimposed on CKD IIIb  - SCr is 2.83 on admission, up from 1.95 a month earlier and 1.48 the month before that  - No stones or hydronephrosis on CT in ED  - Check FENa, continue IVF hydration, renally-dose medications, avoid nephrotoxins, repeat chem panel in am    3. Hypertension  - Some SBP readings in low 90's in ED, asymptomatic   - Hold antihypertensives, continue saline infusion    4. Anemia  - Continues to improve since the recent surgery    5. Hyponatremia  - Serum sodium is 133 on admission  - Hold HCTZ, continue NS infusion, repeat chem panel in am    6. Hypothyroidism  - Continue Synthroid  7. COPD  - Stable, continue as-needed albuterol      DVT prophylaxis: SCDs Code Status: Full  Family Communication: Discussed with patient  Disposition Plan: Likely back home pending NSG clearance  Consults called: Neurosurgery consulted by ED physician  Admission status: Observation     Vianne Bulls, MD [facility] Pager: See [email or web address]  If 7AM-7PM, please contact the daytime attending [email or web address]  06/25/2019, 4:24 AM

## 2019-06-26 DIAGNOSIS — D539 Nutritional anemia, unspecified: Secondary | ICD-10-CM

## 2019-06-26 DIAGNOSIS — I519 Heart disease, unspecified: Secondary | ICD-10-CM

## 2019-06-26 DIAGNOSIS — M48061 Spinal stenosis, lumbar region without neurogenic claudication: Secondary | ICD-10-CM

## 2019-06-26 LAB — CBC
HCT: 28.8 % — ABNORMAL LOW (ref 36.0–46.0)
Hemoglobin: 9.1 g/dL — ABNORMAL LOW (ref 12.0–15.0)
MCH: 31.4 pg (ref 26.0–34.0)
MCHC: 31.6 g/dL (ref 30.0–36.0)
MCV: 99.3 fL (ref 80.0–100.0)
Platelets: 340 10*3/uL (ref 150–400)
RBC: 2.9 MIL/uL — ABNORMAL LOW (ref 3.87–5.11)
RDW: 14.1 % (ref 11.5–15.5)
WBC: 9.4 10*3/uL (ref 4.0–10.5)
nRBC: 0 % (ref 0.0–0.2)

## 2019-06-26 LAB — UREA NITROGEN, URINE: Urea Nitrogen, Ur: 599 mg/dL

## 2019-06-26 MED ORDER — OXYCODONE-ACETAMINOPHEN 5-325 MG PO TABS: 1.0000 | ORAL_TABLET | ORAL | Status: DC | PRN

## 2019-06-26 MED ORDER — ACETAMINOPHEN 325 MG PO TABS
650.0000 mg | ORAL_TABLET | Freq: Four times a day (QID) | ORAL | Status: DC
  Administered 2019-06-26 – 2019-06-28 (×8): 650 mg via ORAL
  Filled 2019-06-26 (×8): qty 2

## 2019-06-26 MED ORDER — OXYCODONE HCL 5 MG PO TABS
5.0000 mg | ORAL_TABLET | ORAL | Status: DC | PRN
  Administered 2019-06-26 – 2019-06-28 (×11): 5 mg via ORAL
  Filled 2019-06-26 (×11): qty 1

## 2019-06-26 MED ORDER — METHOCARBAMOL 750 MG PO TABS
750.0000 mg | ORAL_TABLET | Freq: Three times a day (TID) | ORAL | Status: DC
  Administered 2019-06-26 – 2019-06-28 (×7): 750 mg via ORAL
  Filled 2019-06-26 (×7): qty 1

## 2019-06-26 MED ORDER — DEXAMETHASONE SODIUM PHOSPHATE 4 MG/ML IJ SOLN
4.0000 mg | Freq: Four times a day (QID) | INTRAMUSCULAR | Status: DC
  Administered 2019-06-26 – 2019-06-28 (×9): 4 mg via INTRAVENOUS
  Filled 2019-06-26 (×10): qty 1

## 2019-06-26 MED ORDER — POLYETHYLENE GLYCOL 3350 17 G PO PACK
17.0000 g | PACK | Freq: Two times a day (BID) | ORAL | Status: DC
  Administered 2019-06-27: 17 g via ORAL
  Filled 2019-06-26 (×2): qty 1

## 2019-06-26 NOTE — Progress Notes (Addendum)
  NEUROSURGERY PROGRESS NOTE   No issues overnight. Continues to complain mostly of LLQ pain, left hip pain  EXAM:  BP 114/72 (BP Location: Right Arm)   Pulse (!) 58   Temp 98.4 F (36.9 C) (Oral)   Resp 18   Ht 5\' 7"  (1.702 m)   Wt 112 kg   SpO2 100%   BMI 38.67 kg/m   Awake, alert, oriented  Speech fluent, appropriate  CN grossly intact  5/5 BUE/BLE  Incision: c/d/i  IMPRESSION/PLAN 74 y.o. female approximately 3 weeks s/p L2 decompression with extension of prior fusion to L2-S1, admitted with possible AKI. - AKI:  Per TH Cr improved.  - Has not been up ambulating yet. Ordered PT/OT - Added decadron 4mg  q 6 - Cleared from NS perspective for d/c pending PT/OT

## 2019-06-26 NOTE — Evaluation (Signed)
Occupational Therapy Evaluation Patient Details Name: Brandi Stephens MRN: [id number] DOB: June 10, 1945 Today's Date: 06/26/2019    History of Present Illness Pt is a 74 y/o female admitted secondary to worsening back pain and abnormal lab work indicating ARF. Pt with recent back surgery as well.  PMH includes HTN and CKD.    Clinical Impression   Patient admitted for above and limited by problem list below, including impaired balance, back pain, decreased activity tolerance.  She demonstrates good awareness of back precautions, min cueing for adherence during session.  Requires min-max assist for ADLs and min assist for transfers/ in room mobility.  Pt with recent back surgery and decline in functional status due to pain, prior to was independent. She is highly motivated and would benefit from intensive CIR level rehab prior to dc home in order to optimize independence, safety and return to PLOF with ADLs/mobility.     Follow Up Recommendations  CIR;Supervision/Assistance - 24 hour    Equipment Recommendations  None recommended by OT    Recommendations for Other Services Rehab consult     Precautions / Restrictions Precautions Precautions: Back Precaution Booklet Issued: No Precaution Comments: verbally reviewed back precautions.  Restrictions Weight Bearing Restrictions: No      Mobility Bed Mobility Overal bed mobility: Needs Assistance Bed Mobility: Rolling;Sidelying to Sit;Sit to Sidelying Rolling: Min guard Sidelying to sit: Min assist     Sit to sidelying: Mod assist General bed mobility comments: min assist to transition to EOB and mod assist to return to sidelying for BLE support; limited by pain and requires increased time and effort; cueing for PLB.  Transfers Overall transfer level: Needs assistance Equipment used: Rolling walker (2 wheeled) Transfers: Sit to/from Stand Sit to Stand: Min assist         General transfer comment: min assist to steady and  support during transitions. cueing for hand placement    Balance Overall balance assessment: Needs assistance Sitting-balance support: Bilateral upper extremity supported;Feet supported Sitting balance-Leahy Scale: Fair     Standing balance support: Bilateral upper extremity supported;During functional activity Standing balance-Leahy Scale: Poor Standing balance comment: Heavy reliance on UEs                           ADL either performed or assessed with clinical judgement   ADL Overall ADL's : Needs assistance/impaired     Grooming: Min guard;Standing;Wash/dry hands   Upper Body Bathing: Min guard;Sitting   Lower Body Bathing: Maximal assistance;Sit to/from stand   Upper Body Dressing : Minimal assistance;Sitting   Lower Body Dressing: Maximal assistance;Sit to/from stand   Toilet Transfer: Minimal assistance;Ambulation;RW;Regular Toilet;Grab bars   Toileting- Clothing Manipulation and Hygiene: Moderate assistance;Sitting/lateral lean       Functional mobility during ADLs: Minimal assistance;Rolling walker General ADL Comments: pt limited by back pain and decreased activity tolerance     Vision         Perception     Praxis      Pertinent Vitals/Pain Pain Assessment: Faces Faces Pain Scale: Hurts whole lot Pain Location: back Pain Descriptors / Indicators: Grimacing;Guarding;Discomfort;Spasm;Sharp Pain Intervention(s): Limited activity within patient's tolerance;Monitored during session;Repositioned;Heat applied     Hand Dominance Right   Extremity/Trunk Assessment Upper Extremity Assessment Upper Extremity Assessment: Overall WFL for tasks assessed   Lower Extremity Assessment Lower Extremity Assessment: Defer to PT evaluation   Cervical / Trunk Assessment Cervical / Trunk Assessment: Other exceptions Cervical / Trunk Exceptions: recent lumbar  surgery.    Communication Communication Communication: No difficulties   Cognition  Arousal/Alertness: Awake/alert Behavior During Therapy: WFL for tasks assessed/performed Overall Cognitive Status: Within Functional Limits for tasks assessed                                     General Comments  daughter supportive     Exercises     Shoulder Instructions      Home Living Family/patient expects to be discharged to:: Private residence Living Arrangements: Spouse/significant other Available Help at Discharge: Family;Available 24 hours/day Type of Home: House Home Access: Stairs to enter [employer] of Steps: 2 Entrance Stairs-Rails: None Home Layout: Two level Alternate Level Stairs-Number of Steps: flight Alternate Level Stairs-Rails: Can reach both Bathroom Shower/Tub: Walk-in shower;Tub/shower unit   Bathroom Toilet: Handicapped height     Home Equipment: Environmental consultant - 2 wheels;Cane - single point;Grab bars - tub/shower;Walker - 4 wheels;Bedside commode;Shower seat;Tub bench          Prior Functioning/Environment Level of Independence: Needs assistance  Gait / Transfers Assistance Needed: Since back surgery has only been able to tolerate short distances. Per daughter, has been mostly in the bed secondary to pain.  ADL's / Homemaking Assistance Needed: Required assist for bathing/dressing. Per daughter, has only taken 2 showers since surgery.             OT Problem List: Decreased strength;Decreased activity tolerance;Impaired balance (sitting and/or standing);Decreased safety awareness;Pain;Obesity      OT Treatment/Interventions: Self-care/ADL training;DME and/or AE instruction;Therapeutic exercise;Therapeutic activities;Balance training;Patient/family education    OT Goals(Current goals can be found in the care plan section) Acute Rehab OT Goals Patient Stated Goal: to decrease pain and regain independence OT Goal Formulation: With patient Time For Goal Achievement: 07/10/19 Potential to Achieve Goals: Good  OT Frequency:  Min 2X/week   Barriers to D/C:            Co-evaluation              AM-PAC OT "6 Clicks" Daily Activity     Outcome Measure Help from another person eating meals?: None Help from another person taking care of personal grooming?: A Little Help from another person toileting, which includes using toliet, bedpan, or urinal?: A Lot Help from another person bathing (including washing, rinsing, drying)?: A Lot Help from another person to put on and taking off regular upper body clothing?: A Little Help from another person to put on and taking off regular lower body clothing?: A Lot 6 Click Score: 16   End of Session Equipment Utilized During Treatment: Gait belt;Rolling walker Nurse Communication: Mobility status  Activity Tolerance: Patient tolerated treatment well Patient left: in bed;with call bell/phone within reach;with family/visitor present  OT Visit Diagnosis: Other abnormalities of gait and mobility (R26.89);Muscle weakness (generalized) (M62.81);Pain;History of falling (Z91.81) Pain - part of body: (back)                Time: 1410-1438 OT Time Calculation (min): 28 min Charges:  OT General Charges $OT Visit: 1 Visit OT Evaluation $OT Eval Moderate Complexity: 1 Mod OT Treatments $Self Care/Home Management : 8-22 mins  Brandi Stephens, OT Acute Rehabilitation Services Pager [phone] Office [phone]   Brandi Stephens 06/26/2019, 3:40 PM

## 2019-06-26 NOTE — Plan of Care (Signed)

## 2019-06-26 NOTE — Progress Notes (Signed)
Inpatient Rehab Admissions:  Inpatient Rehab Consult received.  I met with patient and her daughter at the bedside for rehabilitation assessment and to discuss goals and expectations of an inpatient rehab admission.  They're both hopeful for CIR admission.  They noted that patient had fallen several times since her return home following recent spinal surgery, and were concerned about safety.  Feel that pt would benefit from short term, intense rehab to reach mod I level and safely return home with spouse and daughter providing support as needed. Will open insurance for authorization and plan for admission pending pain control on PO medications, insurance authorization, and bed availability.   Signed:  , PT, DPT Admissions Coordinator [phone] 06/26/19  3:08 PM    

## 2019-06-26 NOTE — Progress Notes (Signed)
PROGRESS NOTE    Brandi Stephens  [id number] DOB: 1945/05/27 DOA: 06/24/2019 PCP: Girtha Rm, NP-C    Brief Narrative:  Patient was admitted to the hospital with a working diagnosis of worsening postoperative back pain complicated by acute kidney injury and ambulatory dysfunction.  74 year old female who presented with back pain, left leg weakness and abnormal blood work.  She does have significant past medical history for hypertension, hypothyroidism, COPD, chronic kidney disease stage III lumbar spinal stenosis status post recent decompression and fusion.  Her surgery was about a month ago with additional procedure early this month. She reported worsening low back pain with radiation towards the groin bilaterally.  Positive ambulatory dysfunction with falls. Initial physical examination blood pressure 98/48, pulse rate 92, respiratory rate 17, oxygen saturation 93%.  Moist mucous membranes, her lungs are clear to auscultation bilaterally, heart S1-S2 present rhythmic, soft abdomen, no lower extremity edema, left lower extremity strength was limited due to pain.  Sodium 132, potassium 5.2, chloride 99, bicarb 24, glucose 148, BUN 68, creatinine 3.11, AST 44, ALT 46, white count 12.7, hemoglobin 9.7, hematocrit 30.1, platelets 372. MRI  with postoperative changes from recent extension of posterior decompression and fusion L2-L3 level.  Marrow edema within the L2 and L3 vertebral bodies.  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 Acute on chronic back pain, sp recent L2-L3 fusion. MRi with no signs of  infection, patient has been evaluated by neurosurgery.  Will continue pain control with as needed oxycodone, scheduled acetaminophen and muscle relaxant. Follow with PT and OT. Continue with steroids.   2. AKI on CKD stage 3b/ hyponatremia. Renal function with serum cr trending down, will continue to hold on IV fluids and will follow on renal panel in am. Avoid nephrotoxic medications and hypotension.  3. HTN. Continue blood pressure monitoring. Continue with metoprolol.   4. Hypothyroid. Continue levothyroxine   5. COPD. No signs of exacerbation.   6. Chronic multifactorial anemia. Continue close monitoring of cell count.   7. Obesity class 2./ dyslipidemia BMI 38. Continue with statin, will need outpatient follow up.   DVT prophylaxis: enoxaparin   Code Status:  ful Family Communication: I spoke with patient's daughter at the bedside, we talked in detail about patient's condition, plan of care and prognosis and all questions were addressed.  Disposition Plan/ discharge barriers: patient from home, barrier for discharge with sever ambulatory dysfunction and back pain, plan for CIR.   Consultants:   Neurosurgery      Subjective: Patient continue to have back pain, worse with movement and positive ambulatory dysfunction, no nausea or vomiting, no dysuria or increased urine frequency, positive bowel movements.   Objective: Vitals:   06/25/19 0650 06/25/19 1529 06/25/19 2059 06/26/19 0435  BP: 108/68 90/73 129/60 114/72  Pulse: 84 71 67 (!) 58  Resp: 17 18 20 18   Temp: 97.8 F (36.6 C) 98.6 F (37 C) 98 F (36.7 C) 98.4 F (36.9 C)  TempSrc: Oral Oral Oral Oral  SpO2: 96% 96% 98% 100%  Weight: 112 kg     Height: 5\' 7"  (  1.702 m)       Intake/Output Summary (Last 24 hours) at 06/26/2019 1242 Last data filed at 06/26/2019 0900 Gross per 24 hour  Intake 1673.33 ml  Output 550 ml  Net 1123.33 ml   Filed Weights   06/24/19 2104 06/25/19 0650  Weight: 113.4 kg 112 kg     Examination:   General: Not in pain or dyspnea, deconditioned and in pain.,  Neurology: Awake and alert, non focal  E ENT: mild pallor, no icterus, oral mucosa moist Cardiovascular: No JVD. S1-S2 present, rhythmic, no gallops, rubs, or murmurs. No lower extremity edema. Pulmonary: positive breath sounds bilaterally, adequate air movement, no wheezing, rhonchi or rales. Gastrointestinal. Abdomen protuberant with, no organomegaly, non tender, no rebound or guarding Skin. No rashes Musculoskeletal: no joint deformities     Data Reviewed: I have personally reviewed following labs and imaging studies  CBC: Recent Labs  Lab 06/24/19 1430 06/24/19 2257 06/25/19 0912 06/26/19 0200  WBC 12.7* 12.2* 10.1 9.4  NEUTROABS 10.0*  --   --   --   HGB 9.7* 9.7* 9.1* 9.1*  HCT 30.1* 31.6* 29.1* 28.8*  MCV 97 102.6* 100.7* 99.3  PLT 372 342 330 [id number]   Basic Metabolic Panel: Recent Labs  Lab 06/24/19 1430 06/24/19 2257 06/25/19 0418 06/26/19 0200  NA 132* 133* 136 137  K 5.2 4.6 4.9 4.6  CL 99 97* 100 105  CO2 24 24 21* 22  GLUCOSE 148* 132* 113* 171*  BUN 68* 70* 68* 51*  CREATININE 3.11* 2.83* 2.48* 1.29*  CALCIUM 10.3 9.8 9.6 9.5   GFR: Estimated Creatinine Clearance: 50.9 mL/min (A) (by C-G formula based on SCr of 1.29 mg/dL (H)). Liver Function Tests: Recent Labs  Lab 06/24/19 1430 06/24/19 2257  AST 44* 39  ALT 46* 45*  ALKPHOS 126* 104  BILITOT <0.2 0.8  PROT 6.5 7.5  ALBUMIN 3.8 3.3*   No results for input(s): LIPASE, AMYLASE in the last 168 hours. No results for input(s): AMMONIA in the last 168 hours. Coagulation Profile: No results for input(s): INR, PROTIME in the last 168 hours. Cardiac Enzymes: No results for input(s): CKTOTAL, CKMB, CKMBINDEX, TROPONINI in the last 168 hours. BNP (last 3 results) No results for input(s): PROBNP in the last 8760 hours. HbA1C: No results for input(s): HGBA1C in the last 72 hours. CBG: No results for input(s):  GLUCAP in the last 168 hours. Lipid Profile: No results for input(s): CHOL, HDL, LDLCALC, TRIG, CHOLHDL, LDLDIRECT in the last 72 hours. Thyroid Function Tests: No results for input(s): TSH, T4TOTAL, FREET4, T3FREE, THYROIDAB in the last 72 hours. Anemia Panel: Recent Labs    06/24/19 1442  IRON 29      Radiology Studies: I have reviewed all of the imaging during this hospital visit personally     Scheduled Meds: . allopurinol  300 mg Oral Daily  . anastrozole  1 mg Oral Daily  . atorvastatin  20 mg Oral q1800  . dexamethasone (DECADRON) injection  4 mg Intravenous Q6H  . docusate sodium  100 mg Oral BID  . DULoxetine  60 mg Oral Daily  . levothyroxine  75 mcg Oral Q0600  . magnesium gluconate  1,000 mg Oral QHS  . Melatonin  10.5 mg Oral QHS  . metoprolol succinate  12.5 mg Oral Daily  . primidone  50 mg Oral QHS  . rOPINIRole  0.75 mg Oral QHS  . vitamin B-12  1,000 mcg Oral Daily   Continuous Infusions:   LOS:  1 day        Dajah Fischman Gerome Apley, MD

## 2019-06-26 NOTE — Progress Notes (Signed)
Rehab Admissions Coordinator Note:  Per PT recommendation, patient was screened by Michel Santee for appropriateness for an Inpatient Acute Rehab Consult.  At this time, we are recommending Inpatient Rehab consult. I will place an order for a consult per our protocol.   Michel Santee 06/26/2019, 1:13 PM  I can be reached at [phone].

## 2019-06-26 NOTE — Evaluation (Signed)
Physical Therapy Evaluation Patient Details Name: Brandi Stephens MRN: [id number] DOB: 08-29-1945 Today's Date: 06/26/2019   History of Present Illness  Pt is a 74 y/o female admitted secondary to worsening back pain and abnormal lab work indicating ARF. Pt with recent back surgery as well.  PMH includes HTN and CKD.   Clinical Impression  Pt admitted secondary to problem above with deficits below. Pt very shaky and unsteady with increased gait distance this session. Pt also with back and L groin pain. Required min A for steadying with use of RW. Pt reports this has been a rapid decline in mobility following surgery, as she was previously independent. Feel she would be a good candidate for CIR level therapies to increase independence with mobility. She is very motivated to regain mobility independence. Will continue to follow acutely to maximize functional mobility independence and safety.     Follow Up Recommendations CIR    Equipment Recommendations  Other (comment)(TBD)    Recommendations for Other Services Rehab consult     Precautions / Restrictions Precautions Precautions: Back Precaution Booklet Issued: No Precaution Comments: verbally reviewed back precautions.  Restrictions Weight Bearing Restrictions: No      Mobility  Bed Mobility Overal bed mobility: Needs Assistance Bed Mobility: Rolling;Sidelying to Sit;Sit to Sidelying Rolling: Min guard Sidelying to sit: Min assist     Sit to sidelying: Min assist General bed mobility comments: Min A for assist with trunk and BLE. Pt requiring extended rest at EOB secondary to pain. Cues for pursed lip breathing.   Transfers Overall transfer level: Needs assistance Equipment used: Rolling walker (2 wheeled) Transfers: Sit to/from Stand Sit to Stand: Min assist         General transfer comment: Min A for steadying assist. Cues for safe hand placement.   Ambulation/Gait Ambulation/Gait assistance: Min guard;Min  assist Gait Distance (Feet): 25 Feet Assistive device: Rolling walker (2 wheeled) Gait Pattern/deviations: Step-through pattern;Decreased stride length Gait velocity: Decreased   General Gait Details: Pt requiring min guard initially, however, with increased distance, pt became very shaky requiring min A for steadying. Pt required seated rest secondary to pain and fatigue.   Stairs            Wheelchair Mobility    Modified Rankin (Stroke Patients Only)       Balance Overall balance assessment: Needs assistance Sitting-balance support: Bilateral upper extremity supported;Feet supported Sitting balance-Leahy Scale: Fair     Standing balance support: Bilateral upper extremity supported;During functional activity Standing balance-Leahy Scale: Poor Standing balance comment: Heavy reliance on UEs                             Pertinent Vitals/Pain Pain Assessment: Faces Faces Pain Scale: Hurts whole lot Pain Location: back, L groin Pain Descriptors / Indicators: Grimacing;Guarding Pain Intervention(s): Limited activity within patient's tolerance;Monitored during session;Repositioned    Home Living Family/patient expects to be discharged to:: Private residence Living Arrangements: Spouse/significant other Available Help at Discharge: Family;Available 24 hours/day Type of Home: House Home Access: Stairs to enter Entrance Stairs-Rails: None Entrance Stairs-Number of Steps: 2 Home Layout: Two level Home Equipment: Walker - 2 wheels;Cane - single point;Grab bars - tub/shower;Walker - 4 wheels;Bedside commode;Shower seat;Tub bench      Prior Function Level of Independence: Needs assistance   Gait / Transfers Assistance Needed: Since back surgery has only been able to tolerate short distances. Per daughter, has been mostly in the bed secondary  to pain.   ADL's / Homemaking Assistance Needed: Required assist for bathing/dressing. Per daughter, has only taken 2  showers since surgery.         Hand Dominance   Dominant Hand: Right    Extremity/Trunk Assessment   Upper Extremity Assessment Upper Extremity Assessment: Defer to OT evaluation    Lower Extremity Assessment Lower Extremity Assessment: Generalized weakness    Cervical / Trunk Assessment Cervical / Trunk Assessment: Other exceptions Cervical / Trunk Exceptions: recent lumbar surgery.   Communication   Communication: No difficulties  Cognition Arousal/Alertness: Awake/alert Behavior During Therapy: WFL for tasks assessed/performed Overall Cognitive Status: Within Functional Limits for tasks assessed                                        General Comments General comments (skin integrity, edema, etc.): Pt and daughter very concerned about pt going home given fall history and decline in mobility.     Exercises     Assessment/Plan    PT Assessment Patient needs continued PT services  PT Problem List Decreased strength;Decreased activity tolerance;Decreased balance;Decreased mobility;Pain       PT Treatment Interventions Gait training;Stair training;Therapeutic exercise;Therapeutic activities;Functional mobility training;Balance training;Patient/family education    PT Goals (Current goals can be found in the Care Plan section)  Acute Rehab PT Goals Patient Stated Goal: to decrease pain and regain independence PT Goal Formulation: With patient Time For Goal Achievement: 07/10/19 Potential to Achieve Goals: Good    Frequency Min 3X/week   Barriers to discharge        Co-evaluation               AM-PAC PT "6 Clicks" Mobility  Outcome Measure Help needed turning from your back to your side while in a flat bed without using bedrails?: A Little Help needed moving from lying on your back to sitting on the side of a flat bed without using bedrails?: A Little Help needed moving to and from a bed to a chair (including a wheelchair)?: A  Little Help needed standing up from a chair using your arms (e.g., wheelchair or bedside chair)?: A Little Help needed to walk in hospital room?: A Little Help needed climbing 3-5 steps with a railing? : A Lot 6 Click Score: 17    End of Session Equipment Utilized During Treatment: Gait belt Activity Tolerance: Patient tolerated treatment well Patient left: in bed;with call bell/phone within reach;with family/visitor present Nurse Communication: Mobility status PT Visit Diagnosis: Unsteadiness on feet (R26.81);History of falling (Z91.81);Pain Pain - part of body: (back)    Time: [id number] PT Time Calculation (min) (ACUTE ONLY): 29 min   Charges:   PT Evaluation $PT Eval Moderate Complexity: 1 Mod PT Treatments $Gait Training: 8-22 mins        Lou Miner, DPT  Acute Rehabilitation Services  Pager: [phone] Office: [phone]   Rudean Hitt 06/26/2019, 1:05 PM

## 2019-06-26 NOTE — Progress Notes (Signed)
PHARMACY - PHYSICIAN COMMUNICATION CRITICAL VALUE ALERT - BLOOD CULTURE IDENTIFICATION (BCID)  Brandi Stephens is an 74 y.o. female who presented to [facility] on 06/24/2019 with a chief complaint of back pain  Assessment:  1/4 BC with gpc, likely contaminant  Name of physician (or Provider) Contacted: Ardith Dark  Current antibiotics: none  Changes to prescribed antibiotics recommended:  none  No results found for this or any previous visit.  Excell Seltzer Poteet 06/26/2019  12:11 AM

## 2019-06-27 LAB — CULTURE, BLOOD (ROUTINE X 2): Special Requests: ADEQUATE

## 2019-06-27 LAB — BASIC METABOLIC PANEL
Anion gap: 10 (ref 5–15)
Anion gap: 10 (ref 5–15)
BUN: 51 mg/dL — ABNORMAL HIGH (ref 8–23)
BUN: 39 mg/dL — ABNORMAL HIGH (ref 8–23)
CO2: 22 mmol/L (ref 22–32)
CO2: 20 mmol/L — ABNORMAL LOW (ref 22–32)
Calcium: 9.5 mg/dL (ref 8.9–10.3)
Calcium: 9.8 mg/dL (ref 8.9–10.3)
Chloride: 105 mmol/L (ref 98–111)
Chloride: 105 mmol/L (ref 98–111)
Creatinine, Ser: 1.29 mg/dL — ABNORMAL HIGH (ref 0.44–1.00)
Creatinine, Ser: 1.18 mg/dL — ABNORMAL HIGH (ref 0.44–1.00)
GFR calc Af Amer: 48 mL/min — ABNORMAL LOW (ref >60)
GFR calc Af Amer: 53 mL/min — ABNORMAL LOW (ref >60)
GFR calc non Af Amer: 41 mL/min — ABNORMAL LOW (ref >60)
GFR calc non Af Amer: 46 mL/min — ABNORMAL LOW (ref >60)
Glucose, Bld: 171 mg/dL — ABNORMAL HIGH (ref 70–99)
Glucose, Bld: 250 mg/dL — ABNORMAL HIGH (ref 70–99)
Potassium: 4.6 mmol/L (ref 3.5–5.1)
Potassium: 5.5 mmol/L — ABNORMAL HIGH (ref 3.5–5.1)
Sodium: 137 mmol/L (ref 135–145)
Sodium: 135 mmol/L (ref 135–145)

## 2019-06-27 LAB — POTASSIUM: Potassium: 4.9 mmol/L (ref 3.5–5.1)

## 2019-06-27 MED ORDER — DICLOFENAC SODIUM 1 % EX GEL
4.0000 g | Freq: Four times a day (QID) | CUTANEOUS | Status: DC
  Administered 2019-06-27 – 2019-06-28 (×5): 4 g via TOPICAL
  Filled 2019-06-27: qty 100

## 2019-06-27 MED ORDER — MORPHINE SULFATE ER 15 MG PO TBCR
15.0000 mg | EXTENDED_RELEASE_TABLET | Freq: Two times a day (BID) | ORAL | Status: DC
  Administered 2019-06-27 – 2019-06-28 (×3): 15 mg via ORAL
  Filled 2019-06-27 (×3): qty 1

## 2019-06-27 NOTE — Progress Notes (Addendum)
PROGRESS NOTE    Brandi Stephens  [id number] DOB: August 14, 1945 DOA: 06/24/2019 PCP: Girtha Rm, NP-C    Brief Narrative:  Patient was admitted to the hospital with a working diagnosis of worsening postoperative back pain complicated by acute kidney injury and ambulatory dysfunction.  74 year old female who presented with back pain, left leg weakness and abnormal blood work.  She does have significant past medical history for hypertension, hypothyroidism, COPD, chronic kidney disease stage III lumbar spinal stenosis status post recent decompression and fusion.  Her surgery was about a month ago with additional procedure early this month. She reported worsening low back pain with radiation towards the groin bilaterally.  Positive ambulatory dysfunction with falls. Initial physical examination blood pressure 98/48, pulse rate 92, respiratory rate 17, oxygen saturation 93%.  Moist mucous membranes, her lungs are clear to auscultation bilaterally, heart S1-S2 present rhythmic, soft abdomen, no lower extremity edema, left lower extremity strength was limited due to pain.  Sodium 132, potassium 5.2, chloride 99, bicarb 24, glucose 148, BUN 68, creatinine 3.11, AST 44, ALT 46, white count 12.7, hemoglobin 9.7, hematocrit 30.1, platelets 372. MRI  with postoperative changes from recent extension of posterior decompression and fusion L2-L3 level.  Marrow edema within the L2 and L3 vertebral bodies.  5.0 x 4 x 7-7.4 cm heterogeneous collection within the  posterior paraspinal soft tissue extending from L3 through L5-S1.   Patient continue to have uncontrolled back pain. PT and OT have recommended inpatient rehab.   Assessment & Plan:   Principal Problem:   Acute renal failure superimposed on stage 3 chronic kidney disease (HCC) Active Problems:   Macrocytic anemia   Essential hypertension   Hypothyroidism   Lumbar spinal stenosis   Grade I diastolic dysfunction   Acute osteomyelitis of  lumbar spine ([place])   1. Acute on chronic back pain, sp recent L2-L3 fusion. MRi with no signs of infection, patient has been evaluated by neurosurgery.  Patient with persistent and sever back pain, will add long acting analgesics with MS contin 15 mg bis and will continue with oxycodone for break through pain control. Add topical diclofenac to lower back and will continue with   , scheduled acetaminophen and methocarbamol. Continue with steroids per surgery recommendations.  On as needed IV fentanyl.   Patient will need CIR for discharge.   2. AKI on CKD stage 3b/ hyponatremia. Renal function with serum cr 1,18 with K at 4,9 and serum bicarbonate at 20. Will continue to hold on IV fluids, continue to encourage po intake. Avoid nephrotoxic medications and hypotension.   3. HTN. On metoprolol for blood pressure control.    4. Hypothyroid. On levothyroxine   5. COPD. No clinical signs of exacerbation.   6. Chronic multifactorial anemia. Hgb at 9,4 and Hct at 28,8, will check iron panel in am.   7. Obesity class 2./ dyslipidemia BMI 38. On statin therapy/ atorvastatin. Will need weight reduction, follow as outpatient.  8. Steroid induced hyperglycemia. Fasting glucose this am is 250, will continue close monitoring, if persistent will add insulin therapy.   9. Restless leg syndrome/ depression/ tremors . Continue with ropinarole, primidone, melatonin and duloxetine.    10. Hx of breast cancer, invasive ductal carcinoma grade 1. Continue with anastrazole, follows with Dr, Jana Hakim, old records personally reviewed.  DVT prophylaxis: enoxaparin   Code Status:  ful Family Communication: no family at the bedside today.   Disposition Plan/ discharge barriers: patient from home, barrier for discharge with sever  ambulatory dysfunction and back pain, plan for CIR.    Consultants:    Neurosurgery    Subjective: Patient continue to have significant back pain, 10/10 sharp in nature,  worse with movement, no improvement with analgesics, positive radiation to the legs, associated with lower back muscle spasms.   Objective: Vitals:   06/26/19 0435 06/26/19 1317 06/26/19 2146 06/27/19 0642  BP: 114/72 (!) 121/50 131/81 (!) 142/63  Pulse: (!) 58 87 92 71  Resp: 18 18 17 18   Temp: 98.4 F (36.9 C) 98.2 F (36.8 C) 98 F (36.7 C) 97.7 F (36.5 C)  TempSrc: Oral Oral Oral Oral  SpO2: 100% 98% 97% 99%  Weight:      Height:        Intake/Output Summary (Last 24 hours) at 06/27/2019 1029 Last data filed at 06/27/2019 0646 Gross per 24 hour  Intake 480 ml  Output --  Net 480 ml   Filed Weights   06/24/19 2104 06/25/19 0650  Weight: 113.4 kg 112 kg    Examination:   General: Not in pain or dyspnea, deconditioned and in pain.  Neurology: Awake and alert, non focal  E ENT: mild pallor, no icterus, oral mucosa moist Cardiovascular: No JVD. S1-S2 present, rhythmic, no gallops, rubs, or murmurs. No lower extremity edema. Pulmonary: positive breath sounds bilaterally, adequate air movement, no wheezing, rhonchi or rales. Gastrointestinal. Abdomen protuberant with no organomegaly, non tender, no rebound or guarding Skin. No rashes Musculoskeletal: tender to palpation at the lower back bilaterally, clean surgical wound with no erythema.      Data Reviewed: I have personally reviewed following labs and imaging studies  CBC: Recent Labs  Lab 06/24/19 1430 06/24/19 2257 06/25/19 0912 06/26/19 0200  WBC 12.7* 12.2* 10.1 9.4  NEUTROABS 10.0*  --   --   --   HGB 9.7* 9.7* 9.1* 9.1*  HCT 30.1* 31.6* 29.1* 28.8*  MCV 97 102.6* 100.7* 99.3  PLT 372 342 330 [id number]   Basic Metabolic Panel: Recent Labs  Lab 06/24/19 1430 06/24/19 2257 06/25/19 0418 06/26/19 0200 06/27/19 0319  NA 132* 133* 136 137 135  K 5.2 4.6 4.9 4.6 5.5*  CL 99 97* 100 105 105  CO2 24 24 21* 22 20*  GLUCOSE 148* 132* 113* 171* 250*  BUN 68* 70* 68* 51* 39*  CREATININE 3.11* 2.83* 2.48*  1.29* 1.18*  CALCIUM 10.3 9.8 9.6 9.5 9.8   GFR: Estimated Creatinine Clearance: 55.7 mL/min (A) (by C-G formula based on SCr of 1.18 mg/dL (H)). Liver Function Tests: Recent Labs  Lab 06/24/19 1430 06/24/19 2257  AST 44* 39  ALT 46* 45*  ALKPHOS 126* 104  BILITOT <0.2 0.8  PROT 6.5 7.5  ALBUMIN 3.8 3.3*   No results for input(s): LIPASE, AMYLASE in the last 168 hours. No results for input(s): AMMONIA in the last 168 hours. Coagulation Profile: No results for input(s): INR, PROTIME in the last 168 hours. Cardiac Enzymes: No results for input(s): CKTOTAL, CKMB, CKMBINDEX, TROPONINI in the last 168 hours. BNP (last 3 results) No results for input(s): PROBNP in the last 8760 hours. HbA1C: No results for input(s): HGBA1C in the last 72 hours. CBG: No results for input(s): GLUCAP in the last 168 hours. Lipid Profile: No results for input(s): CHOL, HDL, LDLCALC, TRIG, CHOLHDL, LDLDIRECT in the last 72 hours. Thyroid Function Tests: No results for input(s): TSH, T4TOTAL, FREET4, T3FREE, THYROIDAB in the last 72 hours. Anemia Panel: Recent Labs    06/24/19 1442  IRON  37      Radiology Studies: I have reviewed all of the imaging during this hospital visit personally     Scheduled Meds: . acetaminophen  650 mg Oral Q6H  . allopurinol  300 mg Oral Daily  . anastrozole  1 mg Oral Daily  . atorvastatin  20 mg Oral q1800  . dexamethasone (DECADRON) injection  4 mg Intravenous Q6H  . docusate sodium  100 mg Oral BID  . DULoxetine  60 mg Oral Daily  . levothyroxine  75 mcg Oral Q0600  . magnesium gluconate  1,000 mg Oral QHS  . Melatonin  10.5 mg Oral QHS  . methocarbamol  750 mg Oral TID  . metoprolol succinate  12.5 mg Oral Daily  . polyethylene glycol  17 g Oral BID  . primidone  50 mg Oral QHS  . rOPINIRole  0.75 mg Oral QHS  . vitamin B-12  1,000 mcg Oral Daily   Continuous Infusions:   LOS: 2 days        Jaelah Hauth Gerome Apley, MD

## 2019-06-27 NOTE — Progress Notes (Signed)
  NEUROSURGERY PROGRESS NOTE   No issues overnight.  Continued pain. Unsure if steroids are helping. No new N/T/W CIR candidate  EXAM:  BP (!) 142/63 (BP Location: Right Arm)   Pulse 71   Temp 97.7 F (36.5 C) (Oral)   Resp 18   Ht 5\' 7"  (1.702 m)   Wt 112 kg   SpO2 99%   BMI 38.67 kg/m   Awake, alert, oriented  Speech fluent, appropriate  CN grossly intact  5/5 BUE/BLE  Incision: c/d/i, no signs of infection  IMPRESSION/PLAN 74 y.o. female approximately 3 weeks s/p L2 decompression with extension of prior fusion to L2-S1, admitted with AKI - AKI resolved:  Per TH  - PT/OT rec CIR. Has been screened and will be pursued. - continue decadron. At time of d/c to rehab, start medrol dose pack to wean  Ferne Reus, PA-C [facility]

## 2019-06-27 NOTE — Progress Notes (Signed)
Physical Therapy Treatment Patient Details Name: Brandi Stephens MRN: [id number] DOB: 1945-11-12 Today's Date: 06/27/2019    History of Present Illness Pt is a 74 y/o female admitted secondary to worsening back pain and abnormal lab work indicating ARF. Pt with recent back surgery as well.  PMH includes HTN and CKD.     PT Comments    Tx focused on gait progression, activity tolerance, and functional transfers. Pt demonstrated improved gait tolerance ambulating 12ft more than last session, RW & gait belt with CGA for safety, with reduced fatigue and instability noted throughout. She demonstrated fair gait with minimal deficits, requiring intermittent VCs for upright posture. Pt required multiple VCs for log rolling for entering and exiting the bed today. She also required modA for LE management for improved sequencing and performance of log roll/entry today. Pt states she is transferring to CIR in the morning. PT will continue following acutely until transfer occurs.  Follow Up Recommendations  CIR     Equipment Recommendations  Other (comment)(TBD following CIR)    Recommendations for Other Services Rehab consult     Precautions / Restrictions Precautions Precautions: Back Precaution Booklet Issued: No Precaution Comments: verbally reviewed back precautions.  Restrictions Weight Bearing Restrictions: No    Mobility  Bed Mobility Overal bed mobility: Needs Assistance Bed Mobility: Rolling;Sidelying to Sit;Sit to Sidelying Rolling: Min guard Sidelying to sit: Min assist     Sit to sidelying: Mod assist General bed mobility comments: min assist to transition to EOB and mod assist to return to sidelying for BLE support; limited by pain and requires increased time and effort; cueing for PLB & sequencing for log roll  Transfers Overall transfer level: Needs assistance Equipment used: Rolling walker (2 wheeled) Transfers: Sit to/from Stand Sit to Stand: Min assist    General transfer comment: min assist to steady RW for initiation. cueing for hand placement  Ambulation/Gait Ambulation/Gait assistance: Min guard;Min assist Gait Distance (Feet): 200 Feet Assistive device: Rolling walker (2 wheeled) Gait Pattern/deviations: Step-through pattern;Decreased stride length Gait velocity: decreased   General Gait Details: Pt required CGA throughout ambulation due to fear of fatigue with last session.  Adequate fatigue and minimal increase in pain following ambulation today.     Balance Overall balance assessment: Needs assistance Sitting-balance support: Bilateral upper extremity supported;Feet supported Sitting balance-Leahy Scale: Fair     Standing balance support: Bilateral upper extremity supported;During functional activity Standing balance-Leahy Scale: Poor Standing balance comment: Moderate reliance on UEs        Cognition Arousal/Alertness: Awake/alert Behavior During Therapy: WFL for tasks assessed/performed Overall Cognitive Status: Within Functional Limits for tasks assessed                   Pertinent Vitals/Pain Pain Assessment: Faces Faces Pain Scale: Hurts even more Pain Location: back Pain Descriptors / Indicators: Grimacing;Guarding;Discomfort;Spasm;Sharp Pain Intervention(s): Monitored during session;Repositioned;Relaxation           PT Goals (current goals can now be found in the care plan section) Acute Rehab PT Goals Patient Stated Goal: To go to CIR for improved independence and strength PT Goal Formulation: With patient Time For Goal Achievement: 07/10/19 Potential to Achieve Goals: Good Progress towards PT goals: Progressing toward goals    Frequency    Min 3X/week      PT Plan Current plan remains appropriate       AM-PAC PT "6 Clicks" Mobility   Outcome Measure  Help needed turning from your back to your side while  in a flat bed without using bedrails?: A Little Help needed moving from lying  on your back to sitting on the side of a flat bed without using bedrails?: A Little Help needed moving to and from a bed to a chair (including a wheelchair)?: A Little Help needed standing up from a chair using your arms (e.g., wheelchair or bedside chair)?: A Little Help needed to walk in hospital room?: A Little Help needed climbing 3-5 steps with a railing? : A Lot 6 Click Score: 17    End of Session Equipment Utilized During Treatment: Gait belt Activity Tolerance: Patient tolerated treatment well Patient left: in bed;with call bell/phone within reach;with family/visitor present Nurse Communication: Mobility status PT Visit Diagnosis: Unsteadiness on feet (R26.81);History of falling (Z91.81);Pain     Time: [id number] PT Time Calculation (min) (ACUTE ONLY): 23 min  Charges:  $Gait Training: 23-37 mins                     Ann Held PT, DPT Acute Rehab [facility] P: [phone]    Renato Gails 06/27/2019, 3:43 PM

## 2019-06-27 NOTE — Progress Notes (Addendum)
Inpatient Rehab Admissions Coordinator:   Received notification from insurance for authorization for CIR.  Will f/u with pt tomorrow for bed availability.   Shann Medal, PT, DPT Admissions Coordinator [phone] 06/27/19  2:56 PM

## 2019-06-28 ENCOUNTER — Inpatient Hospital Stay (HOSPITAL_COMMUNITY)
Admission: RE | Admit: 2019-06-28 | Discharge: 2019-07-11 | DRG: 092 | Disposition: A | Discharge status: Other Acute Inpatient Hospital | Payer: PPO | Source: Intra-hospital | Attending: Physical Medicine & Rehabilitation | Admitting: Physical Medicine & Rehabilitation

## 2019-06-28 ENCOUNTER — Other Ambulatory Visit: Payer: Self-pay

## 2019-06-28 ENCOUNTER — Encounter (HOSPITAL_COMMUNITY): Payer: Self-pay | Admitting: Physical Medicine & Rehabilitation

## 2019-06-28 DIAGNOSIS — T84226A Displacement of internal fixation device of vertebrae, initial encounter: Secondary | ICD-10-CM | POA: Diagnosis not present

## 2019-06-28 DIAGNOSIS — K5730 Diverticulosis of large intestine without perforation or abscess without bleeding: Secondary | ICD-10-CM | POA: Diagnosis not present

## 2019-06-28 DIAGNOSIS — F419 Anxiety disorder, unspecified: Secondary | ICD-10-CM | POA: Diagnosis present

## 2019-06-28 DIAGNOSIS — Z853 Personal history of malignant neoplasm of breast: Secondary | ICD-10-CM

## 2019-06-28 DIAGNOSIS — E782 Mixed hyperlipidemia: Secondary | ICD-10-CM | POA: Diagnosis not present

## 2019-06-28 DIAGNOSIS — M48062 Spinal stenosis, lumbar region with neurogenic claudication: Secondary | ICD-10-CM

## 2019-06-28 DIAGNOSIS — M4626 Osteomyelitis of vertebra, lumbar region: Secondary | ICD-10-CM | POA: Diagnosis not present

## 2019-06-28 DIAGNOSIS — R109 Unspecified abdominal pain: Secondary | ICD-10-CM | POA: Diagnosis not present

## 2019-06-28 DIAGNOSIS — G4700 Insomnia, unspecified: Secondary | ICD-10-CM | POA: Diagnosis not present

## 2019-06-28 DIAGNOSIS — F1010 Alcohol abuse, uncomplicated: Secondary | ICD-10-CM | POA: Diagnosis not present

## 2019-06-28 DIAGNOSIS — N1832 Chronic kidney disease, stage 3b: Secondary | ICD-10-CM | POA: Diagnosis present

## 2019-06-28 DIAGNOSIS — N182 Chronic kidney disease, stage 2 (mild): Secondary | ICD-10-CM | POA: Diagnosis not present

## 2019-06-28 DIAGNOSIS — R29898 Other symptoms and signs involving the musculoskeletal system: Secondary | ICD-10-CM | POA: Diagnosis not present

## 2019-06-28 DIAGNOSIS — Z9010 Acquired absence of unspecified breast and nipple: Secondary | ICD-10-CM

## 2019-06-28 DIAGNOSIS — Z923 Personal history of irradiation: Secondary | ICD-10-CM

## 2019-06-28 DIAGNOSIS — D62 Acute posthemorrhagic anemia: Secondary | ICD-10-CM | POA: Diagnosis not present

## 2019-06-28 DIAGNOSIS — Z880 Allergy status to penicillin: Secondary | ICD-10-CM | POA: Diagnosis not present

## 2019-06-28 DIAGNOSIS — F320 Major depressive disorder, single episode, mild: Secondary | ICD-10-CM | POA: Diagnosis not present

## 2019-06-28 DIAGNOSIS — I493 Ventricular premature depolarization: Secondary | ICD-10-CM | POA: Diagnosis present

## 2019-06-28 DIAGNOSIS — E440 Moderate protein-calorie malnutrition: Secondary | ICD-10-CM | POA: Diagnosis not present

## 2019-06-28 DIAGNOSIS — R2689 Other abnormalities of gait and mobility: Principal | ICD-10-CM | POA: Diagnosis present

## 2019-06-28 DIAGNOSIS — E039 Hypothyroidism, unspecified: Secondary | ICD-10-CM

## 2019-06-28 DIAGNOSIS — M62830 Muscle spasm of back: Secondary | ICD-10-CM | POA: Diagnosis not present

## 2019-06-28 DIAGNOSIS — Z716 Tobacco abuse counseling: Secondary | ICD-10-CM

## 2019-06-28 DIAGNOSIS — Z885 Allergy status to narcotic agent status: Secondary | ICD-10-CM | POA: Diagnosis not present

## 2019-06-28 DIAGNOSIS — Z85828 Personal history of other malignant neoplasm of skin: Secondary | ICD-10-CM

## 2019-06-28 DIAGNOSIS — N183 Chronic kidney disease, stage 3 unspecified: Secondary | ICD-10-CM

## 2019-06-28 DIAGNOSIS — M62838 Other muscle spasm: Secondary | ICD-10-CM

## 2019-06-28 DIAGNOSIS — E46 Unspecified protein-calorie malnutrition: Secondary | ICD-10-CM

## 2019-06-28 DIAGNOSIS — I5032 Chronic diastolic (congestive) heart failure: Secondary | ICD-10-CM | POA: Diagnosis present

## 2019-06-28 DIAGNOSIS — Z981 Arthrodesis status: Secondary | ICD-10-CM | POA: Diagnosis not present

## 2019-06-28 DIAGNOSIS — F411 Generalized anxiety disorder: Secondary | ICD-10-CM | POA: Diagnosis not present

## 2019-06-28 DIAGNOSIS — Z6838 Body mass index (BMI) 38.0-38.9, adult: Secondary | ICD-10-CM

## 2019-06-28 DIAGNOSIS — M5416 Radiculopathy, lumbar region: Secondary | ICD-10-CM

## 2019-06-28 DIAGNOSIS — E876 Hypokalemia: Secondary | ICD-10-CM | POA: Diagnosis not present

## 2019-06-28 DIAGNOSIS — L02212 Cutaneous abscess of back [any part, except buttock]: Secondary | ICD-10-CM | POA: Diagnosis not present

## 2019-06-28 DIAGNOSIS — N281 Cyst of kidney, acquired: Secondary | ICD-10-CM | POA: Diagnosis not present

## 2019-06-28 DIAGNOSIS — N390 Urinary tract infection, site not specified: Secondary | ICD-10-CM | POA: Diagnosis not present

## 2019-06-28 DIAGNOSIS — B370 Candidal stomatitis: Secondary | ICD-10-CM | POA: Diagnosis not present

## 2019-06-28 DIAGNOSIS — R7303 Prediabetes: Secondary | ICD-10-CM | POA: Diagnosis not present

## 2019-06-28 DIAGNOSIS — K5909 Other constipation: Secondary | ICD-10-CM | POA: Diagnosis present

## 2019-06-28 DIAGNOSIS — D631 Anemia in chronic kidney disease: Secondary | ICD-10-CM | POA: Diagnosis present

## 2019-06-28 DIAGNOSIS — I517 Cardiomegaly: Secondary | ICD-10-CM | POA: Diagnosis not present

## 2019-06-28 DIAGNOSIS — Y842 Radiological procedure and radiotherapy as the cause of abnormal reaction of the patient, or of later complication, without mention of misadventure at the time of the procedure: Secondary | ICD-10-CM | POA: Diagnosis present

## 2019-06-28 DIAGNOSIS — N179 Acute kidney failure, unspecified: Secondary | ICD-10-CM | POA: Diagnosis present

## 2019-06-28 DIAGNOSIS — E8809 Other disorders of plasma-protein metabolism, not elsewhere classified: Secondary | ICD-10-CM

## 2019-06-28 DIAGNOSIS — K219 Gastro-esophageal reflux disease without esophagitis: Secondary | ICD-10-CM | POA: Diagnosis present

## 2019-06-28 DIAGNOSIS — I130 Hypertensive heart and chronic kidney disease with heart failure and stage 1 through stage 4 chronic kidney disease, or unspecified chronic kidney disease: Secondary | ICD-10-CM | POA: Diagnosis present

## 2019-06-28 DIAGNOSIS — J431 Panlobular emphysema: Secondary | ICD-10-CM | POA: Diagnosis not present

## 2019-06-28 DIAGNOSIS — C50212 Malignant neoplasm of upper-inner quadrant of left female breast: Secondary | ICD-10-CM

## 2019-06-28 DIAGNOSIS — D649 Anemia, unspecified: Secondary | ICD-10-CM | POA: Diagnosis present

## 2019-06-28 DIAGNOSIS — M109 Gout, unspecified: Secondary | ICD-10-CM | POA: Diagnosis present

## 2019-06-28 DIAGNOSIS — R0989 Other specified symptoms and signs involving the circulatory and respiratory systems: Secondary | ICD-10-CM | POA: Diagnosis not present

## 2019-06-28 DIAGNOSIS — K029 Dental caries, unspecified: Secondary | ICD-10-CM | POA: Diagnosis present

## 2019-06-28 DIAGNOSIS — E875 Hyperkalemia: Secondary | ICD-10-CM | POA: Diagnosis present

## 2019-06-28 DIAGNOSIS — E871 Hypo-osmolality and hyponatremia: Secondary | ICD-10-CM | POA: Diagnosis present

## 2019-06-28 DIAGNOSIS — Z720 Tobacco use: Secondary | ICD-10-CM | POA: Diagnosis not present

## 2019-06-28 DIAGNOSIS — E669 Obesity, unspecified: Secondary | ICD-10-CM | POA: Diagnosis present

## 2019-06-28 DIAGNOSIS — Y792 Prosthetic and other implants, materials and accessory orthopedic devices associated with adverse incidents: Secondary | ICD-10-CM | POA: Diagnosis not present

## 2019-06-28 DIAGNOSIS — I10 Essential (primary) hypertension: Secondary | ICD-10-CM | POA: Diagnosis present

## 2019-06-28 DIAGNOSIS — M4646 Discitis, unspecified, lumbar region: Secondary | ICD-10-CM

## 2019-06-28 DIAGNOSIS — Z87891 Personal history of nicotine dependence: Secondary | ICD-10-CM | POA: Diagnosis not present

## 2019-06-28 DIAGNOSIS — M48061 Spinal stenosis, lumbar region without neurogenic claudication: Secondary | ICD-10-CM | POA: Diagnosis not present

## 2019-06-28 DIAGNOSIS — E038 Other specified hypothyroidism: Secondary | ICD-10-CM | POA: Diagnosis not present

## 2019-06-28 DIAGNOSIS — R5383 Other fatigue: Secondary | ICD-10-CM

## 2019-06-28 DIAGNOSIS — T8140XA Infection following a procedure, unspecified, initial encounter: Secondary | ICD-10-CM | POA: Diagnosis not present

## 2019-06-28 DIAGNOSIS — R251 Tremor, unspecified: Secondary | ICD-10-CM

## 2019-06-28 DIAGNOSIS — I959 Hypotension, unspecified: Secondary | ICD-10-CM | POA: Diagnosis not present

## 2019-06-28 DIAGNOSIS — T8149XA Infection following a procedure, other surgical site, initial encounter: Secondary | ICD-10-CM | POA: Diagnosis not present

## 2019-06-28 DIAGNOSIS — K567 Ileus, unspecified: Secondary | ICD-10-CM

## 2019-06-28 DIAGNOSIS — J302 Other seasonal allergic rhinitis: Secondary | ICD-10-CM | POA: Diagnosis present

## 2019-06-28 DIAGNOSIS — M7989 Other specified soft tissue disorders: Secondary | ICD-10-CM | POA: Diagnosis not present

## 2019-06-28 DIAGNOSIS — Z888 Allergy status to other drugs, medicaments and biological substances status: Secondary | ICD-10-CM | POA: Diagnosis not present

## 2019-06-28 DIAGNOSIS — R52 Pain, unspecified: Secondary | ICD-10-CM

## 2019-06-28 DIAGNOSIS — Z881 Allergy status to other antibiotic agents status: Secondary | ICD-10-CM | POA: Diagnosis not present

## 2019-06-28 DIAGNOSIS — F17210 Nicotine dependence, cigarettes, uncomplicated: Secondary | ICD-10-CM | POA: Diagnosis present

## 2019-06-28 DIAGNOSIS — G8918 Other acute postprocedural pain: Secondary | ICD-10-CM | POA: Diagnosis not present

## 2019-06-28 DIAGNOSIS — G894 Chronic pain syndrome: Secondary | ICD-10-CM

## 2019-06-28 DIAGNOSIS — Z170 Estrogen receptor positive status [ER+]: Secondary | ICD-10-CM

## 2019-06-28 DIAGNOSIS — F32A Depression, unspecified: Secondary | ICD-10-CM | POA: Diagnosis present

## 2019-06-28 DIAGNOSIS — G2581 Restless legs syndrome: Secondary | ICD-10-CM | POA: Diagnosis present

## 2019-06-28 DIAGNOSIS — F329 Major depressive disorder, single episode, unspecified: Secondary | ICD-10-CM | POA: Diagnosis present

## 2019-06-28 DIAGNOSIS — J449 Chronic obstructive pulmonary disease, unspecified: Secondary | ICD-10-CM | POA: Diagnosis present

## 2019-06-28 LAB — BASIC METABOLIC PANEL
Anion gap: 12 (ref 5–15)
BUN: 41 mg/dL — ABNORMAL HIGH (ref 8–23)
CO2: 23 mmol/L (ref 22–32)
Calcium: 9.7 mg/dL (ref 8.9–10.3)
Chloride: 100 mmol/L (ref 98–111)
Creatinine, Ser: 1.25 mg/dL — ABNORMAL HIGH (ref 0.44–1.00)
GFR calc Af Amer: 50 mL/min — ABNORMAL LOW (ref >60)
GFR calc non Af Amer: 43 mL/min — ABNORMAL LOW (ref >60)
Glucose, Bld: 227 mg/dL — ABNORMAL HIGH (ref 70–99)
Potassium: 5.1 mmol/L (ref 3.5–5.1)
Sodium: 135 mmol/L (ref 135–145)

## 2019-06-28 MED ORDER — ALLOPURINOL 300 MG PO TABS
300.0000 mg | ORAL_TABLET | Freq: Every day | ORAL | Status: DC
  Administered 2019-06-29 – 2019-07-04 (×6): 300 mg via ORAL
  Filled 2019-06-28 (×6): qty 1

## 2019-06-28 MED ORDER — DOCUSATE SODIUM 100 MG PO CAPS
100.0000 mg | ORAL_CAPSULE | Freq: Two times a day (BID) | ORAL | Status: DC
  Administered 2019-06-28 – 2019-07-01 (×6): 100 mg via ORAL
  Filled 2019-06-28 (×6): qty 1

## 2019-06-28 MED ORDER — DULOXETINE HCL 30 MG PO CPEP
60.0000 mg | ORAL_CAPSULE | Freq: Every day | ORAL | Status: DC
  Administered 2019-06-29 – 2019-07-04 (×6): 60 mg via ORAL
  Filled 2019-06-28 (×6): qty 2

## 2019-06-28 MED ORDER — SODIUM ZIRCONIUM CYCLOSILICATE 5 G PO PACK
5.0000 g | PACK | Freq: Three times a day (TID) | ORAL | Status: DC
  Administered 2019-06-28 (×2): 5 g via ORAL
  Filled 2019-06-28 (×3): qty 1

## 2019-06-28 MED ORDER — POLYETHYLENE GLYCOL 3350 17 G PO PACK
17.0000 g | PACK | Freq: Two times a day (BID) | ORAL | Status: DC
  Administered 2019-06-29 – 2019-07-10 (×21): 17 g via ORAL
  Filled 2019-06-28 (×26): qty 1

## 2019-06-28 MED ORDER — DICLOFENAC SODIUM 1 % EX GEL
4.0000 g | Freq: Four times a day (QID) | CUTANEOUS | Status: DC
  Administered 2019-06-28 – 2019-07-08 (×27): 4 g via TOPICAL
  Filled 2019-06-28: qty 100

## 2019-06-28 MED ORDER — METHYLPREDNISOLONE 2 MG PO TABS: ORAL_TABLET | ORAL | 0 refills | Status: DC

## 2019-06-28 MED ORDER — ONDANSETRON HCL 4 MG PO TABS
4.0000 mg | ORAL_TABLET | Freq: Four times a day (QID) | ORAL | Status: DC | PRN
  Administered 2019-07-01 – 2019-07-06 (×8): 4 mg via ORAL
  Filled 2019-06-28 (×8): qty 1

## 2019-06-28 MED ORDER — ACETAMINOPHEN 325 MG PO TABS: 650.0000 mg | ORAL_TABLET | Freq: Four times a day (QID) | ORAL | 0 refills | Status: DC | PRN

## 2019-06-28 MED ORDER — VITAMIN B-12 1000 MCG PO TABS
1000.0000 ug | ORAL_TABLET | Freq: Every day | ORAL | Status: DC
  Administered 2019-06-29 – 2019-07-11 (×13): 1000 ug via ORAL
  Filled 2019-06-28 (×14): qty 1

## 2019-06-28 MED ORDER — PRIMIDONE 50 MG PO TABS
50.0000 mg | ORAL_TABLET | Freq: Every day | ORAL | Status: DC
  Administered 2019-06-28 – 2019-07-10 (×13): 50 mg via ORAL
  Filled 2019-06-28 (×15): qty 1

## 2019-06-28 MED ORDER — FUROSEMIDE 40 MG PO TABS: 40.0000 mg | ORAL_TABLET | Freq: Every day | ORAL | 0 refills | Status: DC

## 2019-06-28 MED ORDER — METOPROLOL SUCCINATE ER 25 MG PO TB24
12.5000 mg | ORAL_TABLET | Freq: Every day | ORAL | Status: DC
  Administered 2019-06-29 – 2019-07-03 (×5): 12.5 mg via ORAL
  Filled 2019-06-28 (×6): qty 1

## 2019-06-28 MED ORDER — ANASTROZOLE 1 MG PO TABS
1.0000 mg | ORAL_TABLET | Freq: Every day | ORAL | Status: DC
  Administered 2019-06-29 – 2019-07-11 (×13): 1 mg via ORAL
  Filled 2019-06-28 (×14): qty 1

## 2019-06-28 MED ORDER — MAGNESIUM GLUCONATE 500 MG PO TABS
1000.0000 mg | ORAL_TABLET | Freq: Every day | ORAL | Status: DC
  Administered 2019-06-28 – 2019-07-10 (×13): 1000 mg via ORAL
  Filled 2019-06-28 (×15): qty 2

## 2019-06-28 MED ORDER — ATORVASTATIN CALCIUM 10 MG PO TABS
20.0000 mg | ORAL_TABLET | Freq: Every day | ORAL | Status: DC
  Administered 2019-06-28 – 2019-07-10 (×13): 20 mg via ORAL
  Filled 2019-06-28 (×13): qty 2

## 2019-06-28 MED ORDER — SORBITOL 70 % SOLN
30.0000 mL | Freq: Every day | Status: DC | PRN
  Administered 2019-07-01: 30 mL via ORAL
  Filled 2019-06-28 (×3): qty 30

## 2019-06-28 MED ORDER — ALBUTEROL SULFATE (2.5 MG/3ML) 0.083% IN NEBU: 3.0000 mL | INHALATION_SOLUTION | RESPIRATORY_TRACT | Status: DC | PRN

## 2019-06-28 MED ORDER — MORPHINE SULFATE ER 15 MG PO TBCR
15.0000 mg | EXTENDED_RELEASE_TABLET | Freq: Two times a day (BID) | ORAL | Status: DC
  Administered 2019-06-28 – 2019-07-11 (×26): 15 mg via ORAL
  Filled 2019-06-28 (×26): qty 1

## 2019-06-28 MED ORDER — ACETAMINOPHEN 325 MG PO TABS
650.0000 mg | ORAL_TABLET | Freq: Four times a day (QID) | ORAL | Status: DC | PRN
  Administered 2019-06-29 – 2019-07-09 (×5): 650 mg via ORAL
  Filled 2019-06-28 (×7): qty 2

## 2019-06-28 MED ORDER — OXYCODONE HCL 5 MG PO TABS
5.0000 mg | ORAL_TABLET | ORAL | Status: DC | PRN
  Administered 2019-06-29 – 2019-07-11 (×44): 5 mg via ORAL
  Filled 2019-06-28 (×46): qty 1

## 2019-06-28 MED ORDER — LEVOTHYROXINE SODIUM 75 MCG PO TABS
75.0000 ug | ORAL_TABLET | Freq: Every day | ORAL | Status: DC
  Administered 2019-06-29 – 2019-07-11 (×13): 75 ug via ORAL
  Filled 2019-06-28 (×13): qty 1

## 2019-06-28 MED ORDER — SODIUM ZIRCONIUM CYCLOSILICATE 5 G PO PACK: 5.0000 g | PACK | Freq: Three times a day (TID) | ORAL | 0 refills | Status: DC

## 2019-06-28 MED ORDER — METHOCARBAMOL 750 MG PO TABS
750.0000 mg | ORAL_TABLET | Freq: Three times a day (TID) | ORAL | Status: DC
  Administered 2019-06-28 – 2019-06-29 (×3): 750 mg via ORAL
  Filled 2019-06-28 (×3): qty 1

## 2019-06-28 MED ORDER — MELATONIN 3 MG PO TABS
10.5000 mg | ORAL_TABLET | Freq: Every day | ORAL | Status: DC
  Administered 2019-06-28 – 2019-07-10 (×13): 10.5 mg via ORAL
  Filled 2019-06-28: qty 3.5
  Filled 2019-06-28: qty 4
  Filled 2019-06-28 (×3): qty 3.5
  Filled 2019-06-28 (×5): qty 4
  Filled 2019-06-28: qty 3.5
  Filled 2019-06-28 (×2): qty 4

## 2019-06-28 MED ORDER — ONDANSETRON HCL 4 MG/2ML IJ SOLN
4.0000 mg | Freq: Four times a day (QID) | INTRAMUSCULAR | Status: DC | PRN
  Administered 2019-07-04 – 2019-07-09 (×4): 4 mg via INTRAVENOUS
  Filled 2019-06-28 (×4): qty 2

## 2019-06-28 MED ORDER — ROPINIROLE HCL 0.5 MG PO TABS
0.7500 mg | ORAL_TABLET | Freq: Every day | ORAL | Status: DC
  Administered 2019-06-28 – 2019-07-10 (×13): 0.75 mg via ORAL
  Filled 2019-06-28 (×15): qty 1

## 2019-06-28 MED ORDER — MORPHINE SULFATE ER 15 MG PO TBCR: 15.0000 mg | EXTENDED_RELEASE_TABLET | Freq: Two times a day (BID) | ORAL | 0 refills | Status: DC

## 2019-06-28 MED ORDER — FENTANYL CITRATE (PF) 100 MCG/2ML IJ SOLN
25.0000 ug | Freq: Once | INTRAMUSCULAR | Status: AC
  Administered 2019-06-28: 25 ug via INTRAVENOUS
  Filled 2019-06-28: qty 2

## 2019-06-28 MED ORDER — OXYCODONE HCL 5 MG PO TABS: 5.0000 mg | ORAL_TABLET | ORAL | 0 refills | Status: DC | PRN

## 2019-06-28 NOTE — PMR Pre-admission (Signed)
PMR Admission Coordinator Pre-Admission Assessment  Patient: Brandi Stephens is an 74 y.o., female MRN: [id number] DOB: 08-12-1945 Height: '5\' 7"'  (170.2 cm) Weight: 112 kg  Insurance Information HMO:     PPO: Yes     PCP:      IPA:      80/20:      OTHER:  PRIMARY: [employer]      Policy#: [id number]      Subscriber: pt CM Name: Colletta Maryland      Phone#: [phone]     Fax#: EPIC  Pre-Cert#: [id number] auth provided by Colletta Maryland for admission to [facility]      Employer: n/a Benefits:  Phone #: [phone]     Name:  Eff. Date: 04/19/19     Deduct: $0      Out of Pocket Max: $3400 [phone] met)      Life Max: n/a CIR: $295/day for days 1-6      SNF: 20 full days Outpatient:      Co-Pay: $15/visit Home Health: 100%      Co-Pay:  DME: 80%     Co-Pay: 20% Providers: in network SECONDARY:       Policy#:       Subscriber:  CM Name:       Phone#:      Fax#:  Pre-Cert#:       Employer:  Benefits:  Phone #:      Name:  Eff. Date:      Deduct:       Out of Pocket Max:       Life Max:  CIR:       SNF:  Outpatient:      Co-Pay:  Home Health:       Co-Pay:  DME:      Co-Pay:   Medicaid Application Date:       Case Manager:  Disability Application Date:       Case Worker:   The "Data Collection Information Summary" for patients in Inpatient Rehabilitation Facilities with attached "Privacy Act [place] Records" was provided and verbally reviewed with: Patient and Family  Emergency Contact Information Contact Information    Name Relation Home Work Mobile   Shopiere Daughter [phone]     Shavon, Ashmore [phone]  [phone]      Current Medical History  Patient Admitting Diagnosis: debility  History of Present Illness:  Brandi Stephens is a 22 year old right-handed female with history significant for obesity with BMI 38.67, hypertension, history of breast cancer, invasive ductal carcinoma grade 1 followed by Dr. Jana Hakim, hypothyroidism, COPD and quit  smoking approximately 13 months ago, tremors as well as restless leg syndrome, chronic constipation, chronic kidney disease stage III, lumbar spinal stenosis post recent decompression and fusion 05/30/2019 per Dr. Kathyrn Sheriff discharge to home supervision level 200 feet with rolling walker.  Presented 06/25/2019 with increasing back pain left leg weakness.  Admission chemistries hemoglobin 9.7, WBC 12,700, BUN 70, creatinine 2.83, sodium 133, SARS coronavirus negative, sedimentation rate 136, lactic acid 0.9.  CT renal study negative for hydronephrosis or ureteral stone.  Blood cultures 1/4 BC with GPC likely contaminant.  MRI lumbar spine showed postoperative changes from recent extension of posterior decompression and fusion L2-3 level.  Persistent severe stenosis at the level of L2-3 with question circumferential epidural collection.  Approximate 5 x 4 point centimeter by 7.4 cm heterogeneous collection within the posterior paraspinous soft tissues extending from L3-L5-S1.  Follow-up neurosurgery Dr. Kathyrn Sheriff in regards to latest back  surgery and no current plans for surgical intervention.  Patient was placed on Decadron protocol.  Renal function continues to greatly improved initially with gentle IV fluids latest creatinine 1.25.  Steroid-induced hyperglycemia and monitored.  Pain managed with the use of MS Contin.  Latest hemoglobin 9.1, WBC improved 9400.  Therapy evaluations completed and patient was recommended for CIR.     Patient's medical record from [facility] has been reviewed by the rehabilitation admission coordinator and physician.  Past Medical History  Past Medical History:  Diagnosis Date  . AIN (anal intraepithelial neoplasia) anal canal   . Anemia    with pregnancy  . Anxiety   . Arthritis    knees, lower back, knees  . Chronic constipation   . CKD (chronic kidney disease)   . Coarse tremors    Essential  . COPD (chronic obstructive pulmonary disease) ([place])    2017 chest  CT, history of  . Degenerative lumbar spinal stenosis   . Depression   . Drug dependency ([place])   . Elevated PTHrP level 05/09/2019  . GERD (gastroesophageal reflux disease)    pt denies  . Gout   . Grade I diastolic dysfunction 18/29/9371   Noted ECHO  . Hemorrhoids   . History of adenomatous polyp of colon   . History of basal cell carcinoma (BCC) excision 2015   nose  . History of sepsis 05/14/2014   post lumbar surgery  . History of shingles    x2  . Hyperlipidemia   . Hypertension   . Hypothyroidism   . Irregular heart beat    history of while undergoing radiation  . Malignant neoplasm of upper-inner quadrant of left breast in female, estrogen receptor positive [facility]) oncologist-  dr Jana Hakim--  per lov in epic no recurrence   dx 07-13-2015  Left breast invasive ductal carcinoma, Stage IA, Grade 1 (TXN0),  09-16-2015  s/p  left breast lumpectomy with sln bx's,  completed radiation 11-18-2015,  started antiestrogen therapy 12-14-2015  . Neuropathy    Left foot and back of left leg  . Personal history of radiation therapy    completed 11-18-2015  left breast  . Pneumonia   . PONV (postoperative nausea and vomiting)   . Prediabetes    diet controlled, no med  . Restless leg syndrome   . Seasonal allergies   . Seizure ([place]) 05/2015   due to sepsis, only one time episode  . Tremor   . Wears partial dentures    lower     Family History   family history includes Atrial fibrillation in her mother; Brain cancer in her maternal aunt; Breast cancer in her maternal aunt; Lung cancer in her maternal aunt and maternal uncle; Prostate cancer in her maternal grandfather; Stomach cancer in her maternal grandmother; Stroke in her paternal grandmother; Tuberculosis in her paternal grandfather; Ulcers in her father.  Prior Rehab/Hospitalizations Has the patient had prior rehab or hospitalizations prior to admission? Yes  Has the patient had major surgery during 100 days prior to  admission? Yes   Current Medications  Current Facility-Administered Medications:  .  acetaminophen (TYLENOL) tablet 650 mg, 650 mg, Oral, Q6H, Arrien, Jimmy Picket, MD, 650 mg at 06/28/19 0523 .  albuterol (PROVENTIL) (2.5 MG/3ML) 0.083% nebulizer solution 3 mL, 3 mL, Inhalation, Q4H PRN, Opyd, Timothy S, MD .  allopurinol (ZYLOPRIM) tablet 300 mg, 300 mg, Oral, Daily, Opyd, Ilene Qua, MD, 300 mg at 06/28/19 0924 .  anastrozole (ARIMIDEX) tablet 1 mg,  1 mg, Oral, Daily, Opyd, Ilene Qua, MD, 1 mg at 06/28/19 0924 .  atorvastatin (LIPITOR) tablet 20 mg, 20 mg, Oral, q1800, Opyd, Ilene Qua, MD, 20 mg at 06/27/19 1715 .  dexamethasone (DECADRON) injection 4 mg, 4 mg, Intravenous, Q6H, Costella, Vincent J, PA-C, 4 mg at 06/28/19 0523 .  diclofenac Sodium (VOLTAREN) 1 % topical gel 4 g, 4 g, Topical, QID, Arrien, Jimmy Picket, MD, 4 g at 06/28/19 0924 .  diphenhydrAMINE (BENADRYL) capsule 25 mg, 25 mg, Oral, Daily PRN, Opyd, Ilene Qua, MD .  docusate sodium (COLACE) capsule 100 mg, 100 mg, Oral, BID, Pahwani, Ravi, MD, 100 mg at 06/28/19 0923 .  DULoxetine (CYMBALTA) DR capsule 60 mg, 60 mg, Oral, Daily, Pahwani, Ravi, MD, 60 mg at 06/28/19 0925 .  fentaNYL (SUBLIMAZE) injection 12.5-25 mcg, 12.5-25 mcg, Intravenous, Q3H PRN, Opyd, Ilene Qua, MD, 25 mcg at 06/28/19 0211 .  levothyroxine (SYNTHROID) tablet 75 mcg, 75 mcg, Oral, Q0600, Opyd, Ilene Qua, MD, 75 mcg at 06/28/19 0523 .  magnesium gluconate (MAGONATE) tablet 1,000 mg, 1,000 mg, Oral, QHS, Pahwani, Ravi, MD, 1,000 mg at 06/27/19 2128 .  Melatonin TABS 10.5 mg, 10.5 mg, Oral, QHS, Opyd, Ilene Qua, MD, 10.5 mg at 06/27/19 2126 .  methocarbamol (ROBAXIN) tablet 750 mg, 750 mg, Oral, TID, Arrien, Jimmy Picket, MD, 750 mg at 06/28/19 0924 .  metoprolol succinate (TOPROL-XL) 24 hr tablet 12.5 mg, 12.5 mg, Oral, Daily, Pahwani, Ravi, MD, 12.5 mg at 06/28/19 0923 .  morphine (MS CONTIN) 12 hr tablet 15 mg, 15 mg, Oral, Q12H, Arrien, Jimmy Picket, MD, 15 mg at 06/28/19 0923 .  ondansetron (ZOFRAN) tablet 4 mg, 4 mg, Oral, Q6H PRN **OR** ondansetron (ZOFRAN) injection 4 mg, 4 mg, Intravenous, Q6H PRN, Opyd, Ilene Qua, MD, 4 mg at 06/26/19 1028 .  oxyCODONE (Oxy IR/ROXICODONE) immediate release tablet 5 mg, 5 mg, Oral, Q4H PRN, Arrien, Jimmy Picket, MD, 5 mg at 06/28/19 0839 .  polyethylene glycol (MIRALAX / GLYCOLAX) packet 17 g, 17 g, Oral, BID, Arrien, Jimmy Picket, MD, 17 g at 06/27/19 2126 .  primidone (MYSOLINE) tablet 50 mg, 50 mg, Oral, QHS, Opyd, Ilene Qua, MD, 50 mg at 06/27/19 2129 .  rOPINIRole (REQUIP) tablet 0.75 mg, 0.75 mg, Oral, QHS, Opyd, Ilene Qua, MD, 0.75 mg at 06/27/19 2123 .  vitamin B-12 (CYANOCOBALAMIN) tablet 1,000 mcg, 1,000 mcg, Oral, Daily, Opyd, Ilene Qua, MD, 1,000 mcg at 06/28/19 5537  Patients Current Diet:  Diet Order            Diet Heart Room service appropriate? Yes; Fluid consistency: Thin  Diet effective now              Precautions / Restrictions Precautions Precautions: Back Precaution Booklet Issued: No Precaution Comments: verbally reviewed back precautions.  Restrictions Weight Bearing Restrictions: No   Has the patient had 2 or more falls or a fall with injury in the past year? Yes  Prior Activity Level Limited Community (1-2x/wk): had been more limited since recent surgery, though typically still drive and ambulates without an AD  Prior Functional Level Self Care: Did the patient need help bathing, dressing, using the toilet or eating? Needed some help  Indoor Mobility: Did the patient need assistance with walking from room to room (with or without device)? Independent  Stairs: Did the patient need assistance with internal or external stairs (with or without device)? Needed some help  Functional Cognition: Did the patient need help planning regular tasks such as  shopping or remembering to take medications? Independent  Home Assistive Devices / Equipment Home  Assistive Devices/Equipment: Environmental consultant (specify type) Home Equipment: Walker - 2 wheels, Cane - single point, Grab bars - tub/shower, Environmental consultant - 4 wheels, Bedside commode, Shower seat, Tub bench  Prior Device Use: Indicate devices/aids used by the patient prior to current illness, exacerbation or injury? Walker  Current Functional Level Cognition  Overall Cognitive Status: Within Functional Limits for tasks assessed Orientation Level: Oriented X4    Extremity Assessment (includes Sensation/Coordination)  Upper Extremity Assessment: Overall WFL for tasks assessed  Lower Extremity Assessment: Defer to PT evaluation    ADLs  Overall ADL's : Needs assistance/impaired Grooming: Min guard, Standing, Wash/dry hands Upper Body Bathing: Min guard, Sitting Lower Body Bathing: Maximal assistance, Sit to/from stand Upper Body Dressing : Minimal assistance, Sitting Lower Body Dressing: Maximal assistance, Sit to/from stand Toilet Transfer: Minimal assistance, Ambulation, RW, Regular Toilet, Grab bars Toileting- Clothing Manipulation and Hygiene: Moderate assistance, Sitting/lateral lean Functional mobility during ADLs: Minimal assistance, Rolling walker General ADL Comments: pt limited by back pain and decreased activity tolerance    Mobility  Overal bed mobility: Needs Assistance Bed Mobility: Rolling, Sidelying to Sit, Sit to Sidelying Rolling: Min guard Sidelying to sit: Min assist Sit to sidelying: Mod assist General bed mobility comments: min assist to transition to EOB and mod assist to return to sidelying for BLE support; limited by pain and requires increased time and effort; cueing for PLB & sequencing for log roll    Transfers  Overall transfer level: Needs assistance Equipment used: Rolling walker (2 wheeled) Transfers: Sit to/from Stand Sit to Stand: Min assist General transfer comment: min assist to steady RW for initiation. cueing for hand placement    Ambulation / Gait / Stairs  / Wheelchair Mobility  Ambulation/Gait Ambulation/Gait assistance: Min guard, Min assist Gait Distance (Feet): 200 Feet Assistive device: Rolling walker (2 wheeled) Gait Pattern/deviations: Step-through pattern, Decreased stride length General Gait Details: Pt required CGA throughout ambulation due to fear of fatigue with last session.  Adequate fatigue and minimal increase in pain following ambulation today. Gait velocity: decreased    Posture / Balance Balance Overall balance assessment: Needs assistance Sitting-balance support: Bilateral upper extremity supported, Feet supported Sitting balance-Leahy Scale: Fair Standing balance support: Bilateral upper extremity supported, During functional activity Standing balance-Leahy Scale: Poor Standing balance comment: Moderate reliance on UEs    Special needs/care consideration BiPAP/CPAP no CPM no Continuous Drip IV no Dialysis no        Days n/a Life Vest no Oxygen no Special Bed no Trach Size no Wound Vac (area) no      Location n/a Skin intact                               Bowel mgmt: continent Bladder mgmt: continent Diabetic mgmt: no Behavioral consideration no Chemo/radiation no   Previous Home Environment (from acute therapy documentation) Living Arrangements: Spouse/significant other Available Help at Discharge: Family, Available 24 hours/day Type of Home: House Home Layout: Two level Alternate Level Stairs-Rails: Can reach both Alternate Level Stairs-Number of Steps: flight Home Access: Stairs to enter Entrance Stairs-Rails: None Entrance Stairs-Number of Steps: 2 Bathroom Shower/Tub: Gaffer, Chiropodist: Handicapped height Home Care Services: No  Discharge Living Setting Plans for Discharge Living Setting: Patient's home Type of Home at Discharge: House Discharge Home Layout: Two level, Bed/bath upstairs Alternate Level Stairs-Rails: Can reach  both Alternate Level Stairs-Number of  Steps: 14 Discharge Home Access: Stairs to enter Entrance Stairs-Rails: None Entrance Stairs-Number of Steps: 2 Discharge Bathroom Shower/Tub: Tub/shower unit Discharge Bathroom Toilet: Standard Discharge Bathroom Accessibility: Yes How Accessible: Accessible via walker Does the patient have any problems obtaining your medications?: No  Social/Family/Support Systems Anticipated Caregiver: Conservation officer, historic buildings (dtr) and Herbie [place] (spouse) Anticipated Caregiver's Contact Information: [name] [phone] Ability/Limitations of Caregiver: sup mobility, min adls Caregiver Availability: 24/7 Discharge Plan Discussed with Primary Caregiver: Yes Is Caregiver In Agreement with Plan?: Yes Does Caregiver/Family have Issues with Lodging/Transportation while Pt is in Rehab?: No  Goals/Additional Needs Patient/Family Goal for Rehab: PT/OT mod I Expected length of stay: 4-6 days Dietary Needs: heart healthy/thin Equipment Needs: tbd Pt/Family Agrees to Admission and willing to participate: Yes Program Orientation Provided & Reviewed with Pt/Caregiver Including Roles  & Responsibilities: Yes  Decrease burden of Care through IP rehab admission: n/a  Possible need for SNF placement upon discharge: Not anticipated  Patient Condition: I have reviewed medical records from [facility], spoken with CM, and patient and daughter. I met with patient at the bedside for inpatient rehabilitation assessment.  Patient will benefit from ongoing PT and OT, can actively participate in 3 hours of therapy a day 5 days of the week, and can make measurable gains during the admission.  Patient will also benefit from the coordinated team approach during an Inpatient Acute Rehabilitation admission.  The patient will receive intensive therapy as well as Rehabilitation physician, nursing, social worker, and care management interventions.  Due to safety, medication administration, pain management and patient  education the patient requires 24 hour a day rehabilitation nursing.  The patient is currently min assist with mobility and mod/max assist with basic ADLs.  Discharge setting and therapy post discharge at home with home health is anticipated.  Patient has agreed to participate in the Acute Inpatient Rehabilitation Program and will admit today.  Preadmission Screen Completed By:  Michel Santee, PT, DPT 06/28/2019 10:00 AM ______________________________________________________________________   Discussed status with Dr. Posey Pronto on 06/28/19  at 10:08 AM  and received approval for admission today.  Admission Coordinator:  Michel Santee, PT, DPT time 10:08 AM Sudie Grumbling 06/28/19    Assessment/Plan: Diagnosis: Lumbar stenosis 1. Does the need for close, 24 hr/day Medical supervision in concert with the patient's rehab needs make it unreasonable for this patient to be served in a less intensive setting? Potentially 2. Co-Morbidities requiring supervision/potential complications: obesity, hypertension, history of breast cancer, invasive ductal carcinoma grade 1 followed by Dr. Jana Hakim, hypothyroidism, COPD, tremors, RLS, chronic constipation, chronic kidney disease stage III, lumbar spinal stenosis post recent decompression and fusion 05/30/2019 3. Due to bladder management, bowel management, safety, skin/wound care, disease management, pain management and patient education, does the patient require 24 hr/day rehab nursing? Potentially 4. Does the patient require coordinated care of a physician, rehab nurse, PT, OT to address physical and functional deficits in the context of the above medical diagnosis(es)? Potentially Addressing deficits in the following areas: balance, endurance, locomotion, transferring, bathing, dressing, toileting and psychosocial support 5. Can the patient actively participate in an intensive therapy program of at least 3 hrs of therapy 5 days a week? Yes 6. The potential for patient to  make measurable gains while on inpatient rehab is good 7. Anticipated functional outcomes upon discharge from inpatient rehab: modified independent PT, supervision OT, n/a SLP 8. Estimated rehab length of stay to reach the above functional goals is: 4-6  days. 9. Anticipated discharge destination: Home 10. Overall Rehab/Functional Prognosis: excellent   MD Signature: Delice Lesch, MD, ABPMR

## 2019-06-28 NOTE — Progress Notes (Signed)
  NEUROSURGERY PROGRESS NOTE   Patient seen this am for a social visit. Her pain is improved compared to yesterday. She was able to work with therapy more yesterday. She has been approved for CIR and plan for d/c later today. Will follow while in CIR periodically to continue to monitor her progress and incision. Please call for any concerns.  No charge note.

## 2019-06-28 NOTE — Progress Notes (Signed)
Patient transferred to CIR via wheelchair by L. Crozier Therapist, sports. Medicated for pain prior to transfer.

## 2019-06-28 NOTE — Plan of Care (Signed)

## 2019-06-28 NOTE — Progress Notes (Signed)
OT Cancellation Note  Patient Details Name: SHERREN LEMOS MRN: [id number] DOB: 12-Jan-1946   Cancelled Treatment:    Reason Eval/Treat Not Completed: Patient declined, no reason specified. Pt resting in bed and looking forward to dc to CIR today, declines participation at this time. Will follow.   Jolaine Artist, OT Acute Rehabilitation Services Pager [phone] Office [phone]    Delight Stare 06/28/2019, 10:38 AM

## 2019-06-28 NOTE — Progress Notes (Signed)
Pt arrived to unit via w/c. Pt is alert and oriented and able to make her needs known, pt oriented to rehab.

## 2019-06-28 NOTE — H&P (Signed)
Physical Medicine and Rehabilitation Admission H&P    Chief Complaint  Patient presents with  . Elevated Labs  : HPI: Brandi Stephens is a 75 year old right-handed female with history significant for obesity with BMI 38.67, hypertension, , history of breast cancer, invasive ductal carcinoma grade 1 followed by Dr. Jana Hakim, hypothyroidism, COPD and quit smoking approximately 13 months ago, tremors as well as restless leg syndrome, chronic constipation, chronic kidney disease stage III, lumbar spinal stenosis post recent decompression and fusion 05/30/2019 per Dr. Kathyrn Sheriff discharge to home supervision level 200 feet with rolling walker.  History taken from chart review and patient.  Patient lives with spouse.  Two-level home 2 steps to entry.  Reports by family since recent back surgery limited ambulation and sedentary mostly staying in bed.  Required assist for bathing and dressing.  She presented on 06/25/2019 with increasing low back pain with radiation and weakness in left leg.  Admission chemistries hemoglobin 9.7, WBC 12,700, BUN 70, creatinine 2.83, sodium 133, SARS coronavirus negative, ESR was 136, lactic acid 0.9.  CT renal study negative for hydronephrosis or ureteral stone.  Blood cultures 1/4 BC with GPC likely contaminant.  MRI lumbar spine showed postoperative changes from recent extension of posterior decompression and fusion L2-3 level.  Persistent severe stenosis at the level of L2-3 with question circumferential epidural collection.  Approximate 5 x 4 point centimeter by 7.4 cm heterogeneous collection within the posterior paraspinous soft tissues extending from L3-L5-S1.  Follow-up neurosurgery Dr. Kathyrn Sheriff in regards to latest back surgery with no current plans for surgical intervention.  She was started on Decadron.  Renal function continues to improve with gentle IV fluids latest creatinine 1.25.  Hospital course further complicated by steroid-induced hyperglycemia.  Pain managed  with the use of MS Contin.  Latest hemoglobin of 9.1, WBC improved 9400.  Therapy evaluations completed and patient was admitted for a comprehensive rehab program.  Please see preadmission assessment from earlier today as well.  Review of Systems  Constitutional: Negative for fever.  HENT: Negative for hearing loss.   Eyes: Negative for blurred vision and double vision.  Respiratory: Negative for cough and shortness of breath.   Cardiovascular: Negative for chest pain and palpitations.  Gastrointestinal: Negative for abdominal pain, heartburn and nausea.       Chronic constipation, GERD  Genitourinary: Negative for dysuria, flank pain and hematuria.  Musculoskeletal: Positive for back pain, joint pain and myalgias.  Skin: Negative for rash.  Neurological: Positive for tremors, sensory change and focal weakness.       Restless leg syndrome  Psychiatric/Behavioral: Positive for depression. The patient has insomnia.        Anxiety   Past Medical History:  Diagnosis Date  . AIN (anal intraepithelial neoplasia) anal canal   . Anemia    with pregnancy  . Anxiety   . Arthritis    knees, lower back, knees  . Chronic constipation   . CKD (chronic kidney disease)   . Coarse tremors    Essential  . COPD (chronic obstructive pulmonary disease) ([place])    2017 chest CT, history of  . Degenerative lumbar spinal stenosis   . Depression   . Drug dependency ([place])   . Elevated PTHrP level 05/09/2019  . GERD (gastroesophageal reflux disease)    pt denies  . Gout   . Grade I diastolic dysfunction 94/85/4627   Noted ECHO  . Hemorrhoids   . History of adenomatous polyp of colon   . History  of basal cell carcinoma (BCC) excision 2015   nose  . History of sepsis 05/14/2014   post lumbar surgery  . History of shingles    x2  . Hyperlipidemia   . Hypertension   . Hypothyroidism   . Irregular heart beat    history of while undergoing radiation  . Malignant neoplasm of upper-inner quadrant of  left breast in female, estrogen receptor positive [facility]) oncologist-  dr Jana Hakim--  per lov in epic no recurrence   dx 07-13-2015  Left breast invasive ductal carcinoma, Stage IA, Grade 1 (TXN0),  09-16-2015  s/p  left breast lumpectomy with sln bx's,  completed radiation 11-18-2015,  started antiestrogen therapy 12-14-2015  . Neuropathy    Left foot and back of left leg  . Personal history of radiation therapy    completed 11-18-2015  left breast  . Pneumonia   . PONV (postoperative nausea and vomiting)   . Prediabetes    diet controlled, no med  . Restless leg syndrome   . Seasonal allergies   . Seizure ([place]) 05/2015   due to sepsis, only one time episode  . Tremor   . Wears partial dentures    lower    Past Surgical History:  Procedure Laterality Date  . ABDOMINAL HYSTERECTOMY  1985  . BASAL CELL CARCINOMA EXCISION  10/15   nose  . BREAST BIOPSY Right 08/24/2015  . BREAST BIOPSY Right 07/13/2015  . BREAST BIOPSY Left 07/13/2015  . BREAST EXCISIONAL BIOPSY Left 1972  . BREAST LUMPECTOMY Left   . BREAST LUMPECTOMY WITH RADIOACTIVE SEED AND SENTINEL LYMPH NODE BIOPSY Left 09/16/2015   Procedure: BREAST LUMPECTOMY WITH RADIOACTIVE SEED AND SENTINEL LYMPH NODE BIOPSY;  Surgeon: Stark Klein, MD;  Location: [facility];  Service: General;  Laterality: Left;  . BREAST REDUCTION SURGERY Bilateral 1998  . CATARACT EXTRACTION W/ INTRAOCULAR LENS IMPLANT Left 2016  . [place]   hallo  . COLONOSCOPY  01/2013   polyps  . EYE SURGERY Left 2016  . HAND SURGERY Left 02-19-2002   dr Fredna Dow  '@[facility]'    repair collateral ligament/ MPJ left thumb  . [place]  2008  . HIGH RESOLUTION ANOSCOPY N/A 02/28/2018   Procedure: HIGH RESOLUTION ANOSCOPY WITH BIOPSY;  Surgeon: Leighton Ruff, MD;  Location: [facility];  Service: General;  Laterality: N/A;  . KNEE ARTHROSCOPY Right 2002  . LUMBAR SPINE SURGERY  03-20-2015   dr Kathyrn Sheriff   fusion  L4-5, L5-S1  . MOUTH SURGERY  07/14/2018   2 infected teeth removed  . MULTIPLE TOOTH EXTRACTIONS     with bone grafting lower bottom right and left  . REDUCTION MAMMAPLASTY Bilateral   . RIGHT COLECTOMY  09-10-2003   dr Margot Chimes '@[facility]'    multiple colon polyps (per path tubular adenoma's, hyperplastic , benign appendix, benign two lymph nodes  . ROTATOR CUFF REPAIR Left 04/2016  . TONSILLECTOMY  1969  . TUBAL LIGATION Bilateral 1982   Family History  Problem Relation Age of Onset  . Atrial fibrillation Mother   . Ulcers Father   . Breast cancer Maternal Aunt   . Lung cancer Maternal Uncle   . Stomach cancer Maternal Grandmother   . Lung cancer Maternal Aunt   . Brain cancer Maternal Aunt   . Prostate cancer Maternal Grandfather   . Stroke Paternal Grandmother   . Tuberculosis Paternal Grandfather   . Colon cancer Neg Hx   . Esophageal cancer Neg Hx   .  Rectal cancer Neg Hx    Social History:  reports that she quit smoking about 13 months ago. Her smoking use included cigarettes. She has a 17.50 pack-year smoking history. She has never used smokeless tobacco. She reports current alcohol use. She reports that she does not use drugs. Allergies:  Allergies  Allergen Reactions  . Ampicillin Hives and Other (See Comments)    Severe reaction in February 2017 1.5 month to have hives to go away  . Penicillins Hives and Other (See Comments)    Severe reaction in February 2017 1.5 month to have hives to go away Has patient had a PCN reaction causing immediate rash, facial/tongue/throat swelling, SOB or lightheadedness with hypotension: #  #  #  YES  #  #  #  Has patient had a PCN reaction causing severe rash involving mucus membranes or skin necrosis: No Has patient had a PCN reaction that required hospitalization No Has patient had a PCN reaction occurring within the last 10 years: No   . Gabapentin Swelling    UNSPECIFIED REACTION   . Other Itching    UNSPECIFIED Analgesics  . Codeine  Nausea And Vomiting  . Hydrocodone-Acetaminophen Itching  . Hydromorphone Itching    Pt has taken w/o issues   Medications Prior to Admission  Medication Sig Dispense Refill  . albuterol (PROVENTIL HFA;VENTOLIN HFA) 108 (90 Base) MCG/ACT inhaler INHALE 2 PUFFS BY MOUTH EVERY 4 TO 6 HOURS AS NEEDED (Patient taking differently: Inhale 2 puffs into the lungs every 4 (four) hours as needed for wheezing or shortness of breath. ) 42.5 g 0  . allopurinol (ZYLOPRIM) 300 MG tablet TAKE 1 TABLET(300 MG) BY MOUTH DAILY (Patient taking differently: Take 300 mg by mouth daily. ) 90 tablet 0  . anastrozole (ARIMIDEX) 1 MG tablet TAKE 1 TABLET(1 MG) BY MOUTH DAILY (Patient taking differently: Take 1 mg by mouth daily. ) 90 tablet 0  . atorvastatin (LIPITOR) 20 MG tablet TAKE 1 TABLET BY MOUTH EVERY DAY (Patient taking differently: Take 20 mg by mouth daily. ) 90 tablet 1  . Biotin (BIOTIN 5000) 5 MG CAPS Take 5 mg by mouth daily.     . Calcium Carb-Cholecalciferol (CALCIUM 1000 + D PO) Take 1 tablet by mouth daily.    . colchicine 0.6 MG tablet Take 2 tablets now and 1 tablet an hour later for flare up (Patient taking differently: Take 0.6-1.2 mg by mouth daily as needed (gout). ) 90 tablet 0  . diclofenac Sodium (VOLTAREN) 1 % GEL Apply 4 g topically 4 (four) times daily. As needed for knee pain 150 g 1  . diphenhydrAMINE (BENADRYL) 25 MG tablet Take 25 mg by mouth daily as needed for itching.    . DULoxetine (CYMBALTA) 60 MG capsule Take 1 capsule (60 mg total) by mouth daily. 30 capsule 2  . hydrOXYzine (VISTARIL) 25 MG capsule Take 25 mg by mouth 2 (two) times daily as needed for anxiety.    Javier Docker Oil 350 MG CAPS Take 700 mg by mouth daily.    Marland Kitchen levothyroxine (SYNTHROID) 75 MCG tablet Take 1 tablet (75 mcg total) by mouth daily before breakfast. 90 tablet 1  . Magnesium 500 MG CAPS Take 500 mg by mouth daily.    . Melatonin 5 MG/15ML LIQD Take 10 mg by mouth at bedtime.     . methocarbamol (ROBAXIN-750)  750 MG tablet Take 1 tablet (750 mg total) by mouth 3 (three) times daily as needed for muscle spasms.  90 tablet 1  . metoprolol succinate (TOPROL-XL) 25 MG 24 hr tablet TAKE 1/2 TABLET(12.5 MG) BY MOUTH DAILY (Patient taking differently: Take 12.5 mg by mouth daily. ) 45 tablet 0  . olmesartan-hydrochlorothiazide (BENICAR HCT) 40-12.5 MG tablet TAKE 1 TABLET BY MOUTH DAILY 90 tablet 1  . ondansetron (ZOFRAN ODT) 4 MG disintegrating tablet Take 1 tablet (4 mg total) by mouth every 8 (eight) hours as needed for nausea or vomiting. 20 tablet 0  . oxyCODONE-acetaminophen (PERCOCET) 7.5-325 MG tablet Take 1 tablet by mouth every 4 (four) hours as needed for moderate pain.     . polyethylene glycol powder (MIRALAX) powder Take 17 g by mouth daily as needed for moderate constipation.     . primidone (MYSOLINE) 50 MG tablet TAKE 1 TABLET(50 MG) BY MOUTH AT BEDTIME (Patient taking differently: Take 50 mg by mouth at bedtime. ) 90 tablet 1  . rOPINIRole (REQUIP) 0.5 MG tablet Take 1.5 tablets (0.75 mg total) by mouth at bedtime. 140 tablet 1  . vitamin B-12 (CYANOCOBALAMIN) 1000 MCG tablet Take 1,000 mcg by mouth daily.    . furosemide (LASIX) 20 MG tablet TAKE 1 TABLET BY MOUTH DAILY AS NEEDED (Patient not taking: No sig reported) 90 tablet 1    Drug Regimen Review Drug regimen was reviewed and remains appropriate with no significant issues identified  Home: Home Living Family/patient expects to be discharged to:: Private residence Living Arrangements: Spouse/significant other Available Help at Discharge: Family, Available 24 hours/day Type of Home: House Home Access: Stairs to enter [employer] of Steps: 2 Entrance Stairs-Rails: None Home Layout: Two level Alternate Level Stairs-Number of Steps: flight Alternate Level Stairs-Rails: Can reach both Bathroom Shower/Tub: Gaffer, Chiropodist: Handicapped height Home Equipment: Environmental consultant - 2 wheels, [place] - single  point, Grab bars - tub/shower, Environmental consultant - 4 wheels, Bedside commode, Shower seat, Tub bench   Functional History: Prior Function Level of [place]: Needs assistance Gait / Transfers Assistance Needed: Since back surgery has only been able to tolerate short distances. Per daughter, has been mostly in the bed secondary to pain.  ADL's / Homemaking Assistance Needed: Required assist for bathing/dressing. Per daughter, has only taken 2 showers since surgery.   Functional Status:  Mobility: Bed Mobility Overal bed mobility: Needs Assistance Bed Mobility: Rolling, Sidelying to Sit, Sit to Sidelying Rolling: Min guard Sidelying to sit: Min assist Sit to sidelying: Mod assist General bed mobility comments: min assist to transition to EOB and mod assist to return to sidelying for BLE support; limited by pain and requires increased time and effort; cueing for PLB & sequencing for log roll Transfers Overall transfer level: Needs assistance Equipment used: Rolling walker (2 wheeled) Transfers: Sit to/from Stand Sit to Stand: Min assist General transfer comment: min assist to steady RW for initiation. cueing for hand placement Ambulation/Gait Ambulation/Gait assistance: Min guard, Min assist Gait Distance (Feet): 200 Feet Assistive device: Rolling walker (2 wheeled) Gait Pattern/deviations: Step-through pattern, Decreased stride length General Gait Details: Pt required CGA throughout ambulation due to fear of fatigue with last session.  Adequate fatigue and minimal increase in pain following ambulation today. Gait velocity: decreased    ADL: ADL Overall ADL's : Needs assistance/impaired Grooming: Min guard, Standing, Wash/dry hands Upper Body Bathing: Min guard, Sitting Lower Body Bathing: Maximal assistance, Sit to/from stand Upper Body Dressing : Minimal assistance, Sitting Lower Body Dressing: Maximal assistance, Sit to/from stand Toilet Transfer: Minimal assistance, Ambulation, RW,  Regular Toilet, Grab bars Toileting-  Clothing Manipulation and Hygiene: Moderate assistance, Sitting/lateral lean Functional mobility during ADLs: Minimal assistance, Rolling walker General ADL Comments: pt limited by back pain and decreased activity tolerance  Cognition: Cognition Overall Cognitive Status: Within Functional Limits for tasks assessed Orientation Level: Oriented X4 Cognition Arousal/Alertness: Awake/alert Behavior During Therapy: WFL for tasks assessed/performed Overall Cognitive Status: Within Functional Limits for tasks assessed  Physical Exam: Blood pressure 133/67, pulse 68, temperature 98.8 F (37.1 C), temperature source Oral, resp. rate 18, height '5\' 7"'  (1.702 m), weight 112 kg, SpO2 99 %. Physical Exam  Vitals reviewed. Constitutional: She appears well-developed.  Morbidly obese  HENT:  Head: Normocephalic and atraumatic.  Eyes: EOM are normal. Right eye exhibits no discharge. Left eye exhibits no discharge.  Neck: No tracheal deviation present. No thyromegaly present.  Respiratory: Effort normal. No respiratory distress.  GI: Soft. She exhibits no distension.  Musculoskeletal:     Comments: No edema or tenderness in extremities  Neurological: She is alert.  Follows commands.   Oriented x3.   Cooperative with exam. Generalized tremor noted Motor: Bilateral upper extremities: 4-4+/5 proximal distal Bilateral lower extremities: 4-4+/5 proximal distal Sensation diminished to light touch distal extremities  Skin:  Back incision clean and dry  Psychiatric: She has a normal mood and affect. Her behavior is normal.    Results for orders placed or performed during the hospital encounter of 06/24/19 (from the past 48 hour(s))  Basic metabolic panel     Status: Abnormal   Collection Time: 06/27/19  3:19 AM  Result Value Ref Range   Sodium 135 135 - 145 mmol/L   Potassium 5.5 (H) 3.5 - 5.1 mmol/L   Chloride 105 98 - 111 mmol/L   CO2 20 (L) 22 - 32 mmol/L    Glucose, Bld 250 (H) 70 - 99 mg/dL    Comment: Glucose reference range applies only to samples taken after fasting for at least 8 hours.   BUN 39 (H) 8 - 23 mg/dL   Creatinine, Ser 1.18 (H) 0.44 - 1.00 mg/dL   Calcium 9.8 8.9 - 10.3 mg/dL   GFR calc non Af Amer 46 (L) >60 mL/min   GFR calc Af Amer 53 (L) >60 mL/min   Anion gap 10 5 - 15    Comment: Performed at [facility] [address], [place], [place] [address]  Potassium     Status: None   Collection Time: 06/27/19 11:17 AM  Result Value Ref Range   Potassium 4.9 3.5 - 5.1 mmol/L    Comment: Performed at [facility] [address], [place], [place] [address]  Basic metabolic panel     Status: Abnormal   Collection Time: 06/28/19  1:46 AM  Result Value Ref Range   Sodium 135 135 - 145 mmol/L   Potassium 5.1 3.5 - 5.1 mmol/L   Chloride 100 98 - 111 mmol/L   CO2 23 22 - 32 mmol/L   Glucose, Bld 227 (H) 70 - 99 mg/dL    Comment: Glucose reference range applies only to samples taken after fasting for at least 8 hours.   BUN 41 (H) 8 - 23 mg/dL   Creatinine, Ser 1.25 (H) 0.44 - 1.00 mg/dL   Calcium 9.7 8.9 - 10.3 mg/dL   GFR calc non Af Amer 43 (L) >60 mL/min   GFR calc Af Amer 50 (L) >60 mL/min   Anion gap 12 5 - 15    Comment: Performed at [facility], [address] [place], [place] [address]   No results found.   Medical Problem List and Plan: 1.  Decreased functional mobility secondary to acute on chronic back pain S/P recent L2-L3 fusion 05/30/2019.  Decadron taper per neurosurgery  -patient may shower after coming incision  -ELOS/Goals: 8-13 days/supervision/min a  Admit to CIR 2.  Antithrombotics: -DVT/anticoagulation: SCDs.    Vascular study ordered  -antiplatelet therapy: N/A 3. Pain Management: Voltaren gel 4 times daily, Cymbalta 60 mg daily, Robaxin-750 mig 3 times daily, MS Contin 15 mg every 12 hours, oxycodone as needed for breakthrough pain  Monitor with increased exertion 4.  Mood: Provide emotional support  -antipsychotic agents: N/A 5. Neuropsych: This patient is capable of making decisions on her own behalf. 6. Skin/Wound Care: Routine skin checks 7. Fluids/Electrolytes/Nutrition: Routine in and outs.  CMP ordered 8.  Acute on chronic anemia.  CBC ordered 9.  AKI on CKD stage III.  CMP ordered 10.  Hypertension.  Toprol-XL 12.5 mg daily.    Monitor with increased mobility 11.  Obesity.  BMI 38.67.  Dietary follow-up 12.  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  Follow-up outpatient Dr. Jana Hakim.  Continue Armidex 13.  Hyperlipidemia.  Lipitor 14.  History of gout.  Continue Zyloprim. 15.  Hypothyroidism.  TSH 1.420.  Continue Synthroid 16.  Chronic constipation.  Adjust bowel meds as necessary 17.  COPD/quit smoking 13 months ago.  Check oxygen saturations every shift 18.  Tremors with restless leg syndrome.  Continue Requip as well as Mysoline  Cathlyn Parsons, PA-C  06/28/2019  I have personally performed a face to face diagnostic evaluation, including, but not limited to relevant history and physical exam findings, of this patient and developed relevant assessment and plan.  Additionally, I have reviewed and concur with the physician assistant's documentation above.  Delice Lesch, MD, ABPMR  The patient's status has not changed. The original post admission physician evaluation remains appropriate, and any changes from the pre-admission screening or documentation from the acute chart are noted above.   Delice Lesch, MD, ABPMR

## 2019-06-28 NOTE — Progress Notes (Addendum)
Inpatient Rehab Admissions Coordinator:   I have a bed available for pt to admit to CIR today. Awaiting confirmation from Dr. Cathlean Sauer that pt is ready to admit and will let pt/family and CM know.   Addendum: I have approval from Dr. Cathlean Sauer.  Will admit to CIR today.   Shann Medal, PT, DPT Admissions Coordinator [phone] 06/28/19  9:59 AM

## 2019-06-28 NOTE — Care Management Important Message (Signed)
Important Message  Patient Details  Name: Brandi Stephens MRN: [id number] Date of Birth: 07/15/1945   Medicare Important Message Given:  Yes     Memory Argue 06/28/2019, 11:45 AM

## 2019-06-28 NOTE — Progress Notes (Signed)
PMR Admission Coordinator Pre-Admission Assessment   Patient: Brandi Stephens is an 74 y.o., female MRN: [id number] DOB: 21-May-1945 Height: '5\' 7"'  (170.2 cm) Weight: 112 kg   Insurance Information HMO:     PPO: Yes     PCP:      IPA:      80/20:      OTHER:  PRIMARY: [employer]      Policy#: [id number]      Subscriber: pt CM Name: Brandi Stephens      Phone#: [phone]     Fax#: EPIC  Pre-Cert#: [id number] auth provided by Brandi Stephens for admission to [facility]: n/a Benefits:  Phone #: [phone]     Name:  Eff. Date: 04/19/19     Deduct: $0      Out of Pocket Max: $3400 [phone] met)      Life Max: n/a CIR: $295/day for days 1-6      SNF: 20 full days Outpatient:      Co-Pay: $15/visit Home Health: 100%      Co-Pay:  DME: 80%     Co-Pay: 20% Providers: in network SECONDARY:       Policy#:       Subscriber:  CM Name:       Phone#:      Fax#:  Pre-Cert#:       Employer:  Benefits:  Phone #:      Name:  Eff. Date:      Deduct:       Out of Pocket Max:       Life Max:  CIR:       SNF:  Outpatient:      Co-Pay:  Home Health:       Co-Pay:  DME:      Co-Pay:    Medicaid Application Date:       Case Manager:  Disability Application Date:       Case Worker:    The "Data Collection Information Summary" for patients in Inpatient Rehabilitation Facilities with attached "Privacy Act [place] Records" was provided and verbally reviewed with: Patient and Family   Emergency Contact Information         Contact Information     Name Relation Home Work Mobile    [name] Daughter [phone]        Brandi Stephens, Brandi Stephens [phone]   [phone]         Current Medical History  Patient Admitting Diagnosis: debility   History of Present Illness:  Brandi Stephens is a 8 year old right-handed female with history significant for obesity with BMI 38.67, hypertension, history of breast cancer, invasive ductal carcinoma grade 1 followed by Dr. Jana Stephens,  hypothyroidism, COPD and quit smoking approximately 13 months ago, tremors as well as restless leg syndrome, chronic constipation, chronic kidney disease stage III, lumbar spinal stenosis post recent decompression and fusion 05/30/2019 per Dr. Kathyrn Stephens discharge to home supervision level 200 feet with rolling walker.  Presented 06/25/2019 with increasing back pain left leg weakness.  Admission chemistries hemoglobin 9.7, WBC 12,700, BUN 70, creatinine 2.83, sodium 133, SARS coronavirus negative, sedimentation rate 136, lactic acid 0.9.  CT renal study negative for hydronephrosis or ureteral stone.  Blood cultures 1/4 BC with GPC likely contaminant.  MRI lumbar spine showed postoperative changes from recent extension of posterior decompression and fusion L2-3 level.  Persistent severe stenosis at the level of L2-3 with question circumferential epidural collection.  Approximate 5 x 4 point centimeter by  7.4 cm heterogeneous collection within the posterior paraspinous soft tissues extending from L3-L5-S1.  Follow-up neurosurgery Dr. Kathyrn Stephens in regards to latest back surgery and no current plans for surgical intervention.  Patient was placed on Decadron protocol.  Renal function continues to greatly improved initially with gentle IV fluids latest creatinine 1.25.  Steroid-induced hyperglycemia and monitored.  Pain managed with the use of MS Contin.  Latest hemoglobin 9.1, WBC improved 9400.  Therapy evaluations completed and patient was recommended for CIR.    Patient's medical record from [facility] has been reviewed by the rehabilitation admission coordinator and physician.   Past Medical History      Past Medical History:  Diagnosis Date  . AIN (anal intraepithelial neoplasia) anal canal    . Anemia      with pregnancy  . Anxiety    . Arthritis      knees, lower back, knees  . Chronic constipation    . CKD (chronic kidney disease)    . Coarse tremors      Essential  . COPD (chronic  obstructive pulmonary disease) ([place])      2017 chest CT, history of  . Degenerative lumbar spinal stenosis    . Depression    . Drug dependency ([place])    . Elevated PTHrP level 05/09/2019  . GERD (gastroesophageal reflux disease)      pt denies  . Gout    . Grade I diastolic dysfunction 53/66/4403    Noted ECHO  . Hemorrhoids    . History of adenomatous polyp of colon    . History of basal cell carcinoma (BCC) excision 2015    nose  . History of sepsis 05/14/2014    post lumbar surgery  . History of shingles      x2  . Hyperlipidemia    . Hypertension    . Hypothyroidism    . Irregular heart beat      history of while undergoing radiation  . Malignant neoplasm of upper-inner quadrant of left breast in female, estrogen receptor positive [facility]) oncologist-  dr Brandi Stephens--  per lov in epic no recurrence    dx 07-13-2015  Left breast invasive ductal carcinoma, Stage IA, Grade 1 (TXN0),  09-16-2015  s/p  left breast lumpectomy with sln bx's,  completed radiation 11-18-2015,  started antiestrogen therapy 12-14-2015  . Neuropathy      Left foot and back of left leg  . Personal history of radiation therapy      completed 11-18-2015  left breast  . Pneumonia    . PONV (postoperative nausea and vomiting)    . Prediabetes      diet controlled, no med  . Restless leg syndrome    . Seasonal allergies    . Seizure ([place]) 05/2015    due to sepsis, only one time episode  . Tremor    . Wears partial dentures      lower       Family History   family history includes Atrial fibrillation in her mother; Brain cancer in her maternal aunt; Breast cancer in her maternal aunt; Lung cancer in her maternal aunt and maternal uncle; Prostate cancer in her maternal grandfather; Stomach cancer in her maternal grandmother; Stroke in her paternal grandmother; Tuberculosis in her paternal grandfather; Ulcers in her father.   Prior Rehab/Hospitalizations Has the patient had prior rehab or hospitalizations  prior to admission? Yes   Has the patient had major surgery during 100 days prior to admission? Yes  Current Medications   Current Facility-Administered Medications:  .  acetaminophen (TYLENOL) tablet 650 mg, 650 mg, Oral, Q6H, Arrien, Jimmy Picket, MD, 650 mg at 06/28/19 0523 .  albuterol (PROVENTIL) (2.5 MG/3ML) 0.083% nebulizer solution 3 mL, 3 mL, Inhalation, Q4H PRN, Opyd, Timothy S, MD .  allopurinol (ZYLOPRIM) tablet 300 mg, 300 mg, Oral, Daily, Opyd, Ilene Qua, MD, 300 mg at 06/28/19 0924 .  anastrozole (ARIMIDEX) tablet 1 mg, 1 mg, Oral, Daily, Opyd, Ilene Qua, MD, 1 mg at 06/28/19 0924 .  atorvastatin (LIPITOR) tablet 20 mg, 20 mg, Oral, q1800, Opyd, Ilene Qua, MD, 20 mg at 06/27/19 1715 .  dexamethasone (DECADRON) injection 4 mg, 4 mg, Intravenous, Q6H, Costella, Vincent J, PA-C, 4 mg at 06/28/19 0523 .  diclofenac Sodium (VOLTAREN) 1 % topical gel 4 g, 4 g, Topical, QID, Arrien, Jimmy Picket, MD, 4 g at 06/28/19 0924 .  diphenhydrAMINE (BENADRYL) capsule 25 mg, 25 mg, Oral, Daily PRN, Opyd, Ilene Qua, MD .  docusate sodium (COLACE) capsule 100 mg, 100 mg, Oral, BID, Pahwani, Ravi, MD, 100 mg at 06/28/19 0923 .  DULoxetine (CYMBALTA) DR capsule 60 mg, 60 mg, Oral, Daily, Pahwani, Ravi, MD, 60 mg at 06/28/19 0925 .  fentaNYL (SUBLIMAZE) injection 12.5-25 mcg, 12.5-25 mcg, Intravenous, Q3H PRN, Opyd, Ilene Qua, MD, 25 mcg at 06/28/19 0211 .  levothyroxine (SYNTHROID) tablet 75 mcg, 75 mcg, Oral, Q0600, Opyd, Ilene Qua, MD, 75 mcg at 06/28/19 0523 .  magnesium gluconate (MAGONATE) tablet 1,000 mg, 1,000 mg, Oral, QHS, Pahwani, Ravi, MD, 1,000 mg at 06/27/19 2128 .  Melatonin TABS 10.5 mg, 10.5 mg, Oral, QHS, Opyd, Ilene Qua, MD, 10.5 mg at 06/27/19 2126 .  methocarbamol (ROBAXIN) tablet 750 mg, 750 mg, Oral, TID, Arrien, Jimmy Picket, MD, 750 mg at 06/28/19 0924 .  metoprolol succinate (TOPROL-XL) 24 hr tablet 12.5 mg, 12.5 mg, Oral, Daily, Pahwani, Ravi, MD, 12.5  mg at 06/28/19 0923 .  morphine (MS CONTIN) 12 hr tablet 15 mg, 15 mg, Oral, Q12H, Arrien, Jimmy Picket, MD, 15 mg at 06/28/19 0923 .  ondansetron (ZOFRAN) tablet 4 mg, 4 mg, Oral, Q6H PRN **OR** ondansetron (ZOFRAN) injection 4 mg, 4 mg, Intravenous, Q6H PRN, Opyd, Ilene Qua, MD, 4 mg at 06/26/19 1028 .  oxyCODONE (Oxy IR/ROXICODONE) immediate release tablet 5 mg, 5 mg, Oral, Q4H PRN, Arrien, Jimmy Picket, MD, 5 mg at 06/28/19 0839 .  polyethylene glycol (MIRALAX / GLYCOLAX) packet 17 g, 17 g, Oral, BID, Arrien, Jimmy Picket, MD, 17 g at 06/27/19 2126 .  primidone (MYSOLINE) tablet 50 mg, 50 mg, Oral, QHS, Opyd, Ilene Qua, MD, 50 mg at 06/27/19 2129 .  rOPINIRole (REQUIP) tablet 0.75 mg, 0.75 mg, Oral, QHS, Opyd, Ilene Qua, MD, 0.75 mg at 06/27/19 2123 .  vitamin B-12 (CYANOCOBALAMIN) tablet 1,000 mcg, 1,000 mcg, Oral, Daily, Opyd, Ilene Qua, MD, 1,000 mcg at 06/28/19 4193   Patients Current Diet:     Diet Order                      Diet Heart Room service appropriate? Yes; Fluid consistency: Thin  Diet effective now                   Precautions / Restrictions Precautions Precautions: Back Precaution Booklet Issued: No Precaution Comments: verbally reviewed back precautions.  Restrictions Weight Bearing Restrictions: No    Has the patient had 2 or more falls or a fall with injury in the past year? Yes  Prior Activity Level Limited Community (1-2x/wk): had been more limited since recent surgery, though typically still drive and ambulates without an AD   Prior Functional Level Self Care: Did the patient need help bathing, dressing, using the toilet or eating? Needed some help   Indoor Mobility: Did the patient need assistance with walking from room to room (with or without device)? Independent   Stairs: Did the patient need assistance with internal or external stairs (with or without device)? Needed some help   Functional Cognition: Did the patient need help  planning regular tasks such as shopping or remembering to take medications? Independent   Home Assistive Devices / Equipment Home Assistive Devices/Equipment: Environmental consultant (specify type) Home Equipment: Walker - 2 wheels, Cane - single point, Grab bars - tub/shower, Environmental consultant - 4 wheels, Bedside commode, Shower seat, Tub bench   Prior Device Use: Indicate devices/aids used by the patient prior to current illness, exacerbation or injury? Walker   Current Functional Level Cognition   Overall Cognitive Status: Within Functional Limits for tasks assessed Orientation Level: Oriented X4    Extremity Assessment (includes Sensation/Coordination)   Upper Extremity Assessment: Overall WFL for tasks assessed  Lower Extremity Assessment: Defer to PT evaluation     ADLs   Overall ADL's : Needs assistance/impaired Grooming: Min guard, Standing, Wash/dry hands Upper Body Bathing: Min guard, Sitting Lower Body Bathing: Maximal assistance, Sit to/from stand Upper Body Dressing : Minimal assistance, Sitting Lower Body Dressing: Maximal assistance, Sit to/from stand Toilet Transfer: Minimal assistance, Ambulation, RW, Regular Toilet, Grab bars Toileting- Clothing Manipulation and Hygiene: Moderate assistance, Sitting/lateral lean Functional mobility during ADLs: Minimal assistance, Rolling walker General ADL Comments: pt limited by back pain and decreased activity tolerance     Mobility   Overal bed mobility: Needs Assistance Bed Mobility: Rolling, Sidelying to Sit, Sit to Sidelying Rolling: Min guard Sidelying to sit: Min assist Sit to sidelying: Mod assist General bed mobility comments: min assist to transition to EOB and mod assist to return to sidelying for BLE support; limited by pain and requires increased time and effort; cueing for PLB & sequencing for log roll     Transfers   Overall transfer level: Needs assistance Equipment used: Rolling walker (2 wheeled) Transfers: Sit to/from Stand Sit to  Stand: Min assist General transfer comment: min assist to steady RW for initiation. cueing for hand placement     Ambulation / Gait / Stairs / Wheelchair Mobility   Ambulation/Gait Ambulation/Gait assistance: Min guard, Min assist Gait Distance (Feet): 200 Feet Assistive device: Rolling walker (2 wheeled) Gait Pattern/deviations: Step-through pattern, Decreased stride length General Gait Details: Pt required CGA throughout ambulation due to fear of fatigue with last session.  Adequate fatigue and minimal increase in pain following ambulation today. Gait velocity: decreased     Posture / Balance Balance Overall balance assessment: Needs assistance Sitting-balance support: Bilateral upper extremity supported, Feet supported Sitting balance-Leahy Scale: Fair Standing balance support: Bilateral upper extremity supported, During functional activity Standing balance-Leahy Scale: Poor Standing balance comment: Moderate reliance on UEs     Special needs/care consideration BiPAP/CPAP no CPM no Continuous Drip IV no Dialysis no        Days n/a Life Vest no Oxygen no Special Bed no Trach Size no Wound Vac (area) no      Location n/a Skin intact  Bowel mgmt: continent Bladder mgmt: continent Diabetic mgmt: no Behavioral consideration no Chemo/radiation no    Previous Home Environment (from acute therapy documentation) Living Arrangements: Spouse/significant other Available Help at Discharge: Family, Available 24 hours/day Type of Home: House Home Layout: Two level Alternate Level Stairs-Rails: Can reach both Alternate Level Stairs-Number of Steps: flight Home Access: Stairs to enter Entrance Stairs-Rails: None Entrance Stairs-Number of Steps: 2 Bathroom Shower/Tub: Gaffer, Chiropodist: Handicapped [place]: No   Discharge Living Setting Plans for Discharge Living Setting: Patient's home Type of Home at  Discharge: House Discharge Home Layout: Two level, Bed/bath upstairs Alternate Level Stairs-Rails: Can reach both Alternate Level Stairs-Number of Steps: 14 Discharge Home Access: Stairs to enter Entrance Stairs-Rails: None Entrance Stairs-Number of Steps: 2 Discharge Bathroom Shower/Tub: Tub/shower unit Discharge Bathroom Toilet: Standard Discharge Bathroom Accessibility: Yes How Accessible: Accessible via walker Does the patient have any problems obtaining your medications?: No   Social/Family/Support Systems Anticipated Caregiver: Conservation officer, historic buildings (dtr) and Herbie [place] (spouse) Anticipated Caregiver's Contact Information: [name] [phone] Ability/Limitations of Caregiver: sup mobility, min adls Caregiver Availability: 24/7 Discharge Plan Discussed with Primary Caregiver: Yes Is Caregiver In Agreement with Plan?: Yes Does Caregiver/Family have Issues with Lodging/Transportation while Pt is in Rehab?: No   Goals/Additional Needs Patient/Family Goal for Rehab: PT/OT mod I Expected length of stay: 4-6 days Dietary Needs: heart healthy/thin Equipment Needs: tbd Pt/Family Agrees to Admission and willing to participate: Yes Program Orientation Provided & Reviewed with Pt/Caregiver Including Roles  & Responsibilities: Yes   Decrease burden of Care through IP rehab admission: n/a   Possible need for SNF placement upon discharge: Not anticipated   Patient Condition: I have reviewed medical records from [facility], spoken with CM, and patient and daughter. I met with patient at the bedside for inpatient rehabilitation assessment.  Patient will benefit from ongoing PT and OT, can actively participate in 3 hours of therapy a day 5 days of the week, and can make measurable gains during the admission.  Patient will also benefit from the coordinated team approach during an Inpatient Acute Rehabilitation admission.  The patient will receive intensive therapy as well as  Rehabilitation physician, nursing, social worker, and care management interventions.  Due to safety, medication administration, pain management and patient education the patient requires 24 hour a day rehabilitation nursing.  The patient is currently min assist with mobility and mod/max assist with basic ADLs.  Discharge setting and therapy post discharge at home with home health is anticipated.  Patient has agreed to participate in the Acute Inpatient Rehabilitation Program and will admit today.   Preadmission Screen Completed By:  Michel Santee, PT, DPT 06/28/2019 10:00 AM ______________________________________________________________________   Discussed status with Dr. Posey Pronto on 06/28/19  at 10:08 AM  and received approval for admission today.   Admission Coordinator:  Michel Santee, PT, DPT time 10:08 AM Sudie Grumbling 06/28/19     Assessment/Plan: Diagnosis: Lumbar stenosis 1. Does the need for close, 24 hr/day Medical supervision in concert with the patient's rehab needs make it unreasonable for this patient to be served in a less intensive setting? Potentially 2. Co-Morbidities requiring supervision/potential complications: obesity, hypertension, history of breast cancer, invasive ductal carcinoma grade 1 followed by Dr. Jana Stephens, hypothyroidism, COPD, tremors, RLS, chronic constipation, chronic kidney disease stage III, lumbar spinal stenosis post recent decompression and fusion 05/30/2019 3. Due to bladder management, bowel management, safety, skin/wound care, disease management, pain management and patient education, does the  patient require 24 hr/day rehab nursing? Potentially 4. Does the patient require coordinated care of a physician, rehab nurse, PT, OT to address physical and functional deficits in the context of the above medical diagnosis(es)? Potentially Addressing deficits in the following areas: balance, endurance, locomotion, transferring, bathing, dressing, toileting and psychosocial  support 5. Can the patient actively participate in an intensive therapy program of at least 3 hrs of therapy 5 days a week? Yes 6. The potential for patient to make measurable gains while on inpatient rehab is good 7. Anticipated functional outcomes upon discharge from inpatient rehab: modified independent PT, supervision OT, n/a SLP 8. Estimated rehab length of stay to reach the above functional goals is: 4-6 days. 9. Anticipated discharge destination: Home 10. Overall Rehab/Functional Prognosis: excellent     MD Signature: Delice Lesch, MD, ABPMR

## 2019-06-28 NOTE — H&P (Signed)
Physical Medicine and Rehabilitation Admission H&P    Chief Complaint  Patient presents with  . Elevated Labs  : HPI: Brandi Stephens is a 74 year old right-handed female with history significant for obesity with BMI 38.67, hypertension, , history of breast cancer, invasive ductal carcinoma grade 1 followed by Dr. Jana Hakim, hypothyroidism, COPD and quit smoking approximately 13 months ago, tremors as well as restless leg syndrome, chronic constipation, chronic kidney disease stage III, lumbar spinal stenosis post recent decompression and fusion 05/30/2019 per Dr. Kathyrn Sheriff discharge to home supervision level 200 feet with rolling walker.  History taken from chart review and patient.  Patient lives with spouse.  Two-level home 2 steps to entry.  Reports by family since recent back surgery limited ambulation and sedentary mostly staying in bed.  Required assist for bathing and dressing.  She presented on 06/25/2019 with increasing low back pain with radiation and weakness in left leg.  Admission chemistries hemoglobin 9.7, WBC 12,700, BUN 70, creatinine 2.83, sodium 133, SARS coronavirus negative, ESR was 136, lactic acid 0.9.  CT renal study negative for hydronephrosis or ureteral stone.  Blood cultures 1/4 BC with GPC likely contaminant.  MRI lumbar spine showed postoperative changes from recent extension of posterior decompression and fusion L2-3 level.  Persistent severe stenosis at the level of L2-3 with question circumferential epidural collection.  Approximate 5 x 4 point centimeter by 7.4 cm heterogeneous collection within the posterior paraspinous soft tissues extending from L3-L5-S1.  Follow-up neurosurgery Dr. Kathyrn Sheriff in regards to latest back surgery with no current plans for surgical intervention.  She was started on Decadron.  Renal function continues to improve with gentle IV fluids latest creatinine 1.25.  Hospital course further complicated by steroid-induced hyperglycemia.  Pain managed  with the use of MS Contin.  Latest hemoglobin of 9.1, WBC improved 9400.  Therapy evaluations completed and patient was admitted for a comprehensive rehab program.  Please see preadmission assessment from earlier today as well.  Review of Systems  Constitutional: Negative for fever.  HENT: Negative for hearing loss.   Eyes: Negative for blurred vision and double vision.  Respiratory: Negative for cough and shortness of breath.   Cardiovascular: Negative for chest pain and palpitations.  Gastrointestinal: Negative for abdominal pain, heartburn and nausea.       Chronic constipation, GERD  Genitourinary: Negative for dysuria, flank pain and hematuria.  Musculoskeletal: Positive for back pain, joint pain and myalgias.  Skin: Negative for rash.  Neurological: Positive for tremors, sensory change and focal weakness.       Restless leg syndrome  Psychiatric/Behavioral: Positive for depression. The patient has insomnia.        Anxiety   Past Medical History:  Diagnosis Date  . AIN (anal intraepithelial neoplasia) anal canal   . Anemia    with pregnancy  . Anxiety   . Arthritis    knees, lower back, knees  . Chronic constipation   . CKD (chronic kidney disease)   . Coarse tremors    Essential  . COPD (chronic obstructive pulmonary disease) ([place])    2017 chest CT, history of  . Degenerative lumbar spinal stenosis   . Depression   . Drug dependency ([place])   . Elevated PTHrP level 05/09/2019  . GERD (gastroesophageal reflux disease)    pt denies  . Gout   . Grade I diastolic dysfunction 64/68/0321   Noted ECHO  . Hemorrhoids   . History of adenomatous polyp of colon   . History  of basal cell carcinoma (BCC) excision 2015   nose  . History of sepsis 05/14/2014   post lumbar surgery  . History of shingles    x2  . Hyperlipidemia   . Hypertension   . Hypothyroidism   . Irregular heart beat    history of while undergoing radiation  . Malignant neoplasm of upper-inner quadrant of  left breast in female, estrogen receptor positive [facility]) oncologist-  dr Jana Hakim--  per lov in epic no recurrence   dx 07-13-2015  Left breast invasive ductal carcinoma, Stage IA, Grade 1 (TXN0),  09-16-2015  s/p  left breast lumpectomy with sln bx's,  completed radiation 11-18-2015,  started antiestrogen therapy 12-14-2015  . Neuropathy    Left foot and back of left leg  . Personal history of radiation therapy    completed 11-18-2015  left breast  . Pneumonia   . PONV (postoperative nausea and vomiting)   . Prediabetes    diet controlled, no med  . Restless leg syndrome   . Seasonal allergies   . Seizure ([place]) 05/2015   due to sepsis, only one time episode  . Tremor   . Wears partial dentures    lower    Past Surgical History:  Procedure Laterality Date  . ABDOMINAL HYSTERECTOMY  1985  . BASAL CELL CARCINOMA EXCISION  10/15   nose  . BREAST BIOPSY Right 08/24/2015  . BREAST BIOPSY Right 07/13/2015  . BREAST BIOPSY Left 07/13/2015  . BREAST EXCISIONAL BIOPSY Left 1972  . BREAST LUMPECTOMY Left   . BREAST LUMPECTOMY WITH RADIOACTIVE SEED AND SENTINEL LYMPH NODE BIOPSY Left 09/16/2015   Procedure: BREAST LUMPECTOMY WITH RADIOACTIVE SEED AND SENTINEL LYMPH NODE BIOPSY;  Surgeon: Stark Klein, MD;  Location: [facility];  Service: General;  Laterality: Left;  . BREAST REDUCTION SURGERY Bilateral 1998  . CATARACT EXTRACTION W/ INTRAOCULAR LENS IMPLANT Left 2016  . [place]   hallo  . COLONOSCOPY  01/2013   polyps  . EYE SURGERY Left 2016  . HAND SURGERY Left 02-19-2002   dr Fredna Dow  '@[email or web address]'    repair collateral ligament/ MPJ left thumb  . [place]  2008  . HIGH RESOLUTION ANOSCOPY N/A 02/28/2018   Procedure: HIGH RESOLUTION ANOSCOPY WITH BIOPSY;  Surgeon: Leighton Ruff, MD;  Location[facility];  Service: General;  Laterality: N/A;  . KNEE ARTHROSCOPY Right 2002  . LUMBAR SPINE SURGERY  03-20-2015   dr Kathyrn Sheriff   fusion  L4-5, L5-S1  . MOUTH SURGERY  07/14/2018   2 infected teeth removed  . MULTIPLE TOOTH EXTRACTIONS     with bone grafting lower bottom right and left  . REDUCTION MAMMAPLASTY Bilateral   . RIGHT COLECTOMY  09-10-2003   dr Margot Chimes '@[facility]'    multiple colon polyps (per path tubular adenoma's, hyperplastic , benign appendix, benign two lymph nodes  . ROTATOR CUFF REPAIR Left 04/2016  . TONSILLECTOMY  1969  . TUBAL LIGATION Bilateral 1982   Family History  Problem Relation Age of Onset  . Atrial fibrillation Mother   . Ulcers Father   . Breast cancer Maternal Aunt   . Lung cancer Maternal Uncle   . Stomach cancer Maternal Grandmother   . Lung cancer Maternal Aunt   . Brain cancer Maternal Aunt   . Prostate cancer Maternal Grandfather   . Stroke Paternal Grandmother   . Tuberculosis Paternal Grandfather   . Colon cancer Neg Hx   . Esophageal cancer Neg Hx   .  Rectal cancer Neg Hx    Social History:  reports that she quit smoking about 13 months ago. Her smoking use included cigarettes. She has a 17.50 pack-year smoking history. She has never used smokeless tobacco. She reports current alcohol use. She reports that she does not use drugs. Allergies:  Allergies  Allergen Reactions  . Ampicillin Hives and Other (See Comments)    Severe reaction in February 2017 1.5 month to have hives to go away  . Penicillins Hives and Other (See Comments)    Severe reaction in February 2017 1.5 month to have hives to go away Has patient had a PCN reaction causing immediate rash, facial/tongue/throat swelling, SOB or lightheadedness with hypotension: #  #  #  YES  #  #  #  Has patient had a PCN reaction causing severe rash involving mucus membranes or skin necrosis: No Has patient had a PCN reaction that required hospitalization No Has patient had a PCN reaction occurring within the last 10 years: No   . Gabapentin Swelling    UNSPECIFIED REACTION   . Other Itching    UNSPECIFIED Analgesics  . Codeine  Nausea And Vomiting  . Hydrocodone-Acetaminophen Itching  . Hydromorphone Itching    Pt has taken w/o issues   Medications Prior to Admission  Medication Sig Dispense Refill  . albuterol (PROVENTIL HFA;VENTOLIN HFA) 108 (90 Base) MCG/ACT inhaler INHALE 2 PUFFS BY MOUTH EVERY 4 TO 6 HOURS AS NEEDED (Patient taking differently: Inhale 2 puffs into the lungs every 4 (four) hours as needed for wheezing or shortness of breath. ) 42.5 g 0  . allopurinol (ZYLOPRIM) 300 MG tablet TAKE 1 TABLET(300 MG) BY MOUTH DAILY (Patient taking differently: Take 300 mg by mouth daily. ) 90 tablet 0  . anastrozole (ARIMIDEX) 1 MG tablet TAKE 1 TABLET(1 MG) BY MOUTH DAILY (Patient taking differently: Take 1 mg by mouth daily. ) 90 tablet 0  . atorvastatin (LIPITOR) 20 MG tablet TAKE 1 TABLET BY MOUTH EVERY DAY (Patient taking differently: Take 20 mg by mouth daily. ) 90 tablet 1  . Biotin (BIOTIN 5000) 5 MG CAPS Take 5 mg by mouth daily.     . Calcium Carb-Cholecalciferol (CALCIUM 1000 + D PO) Take 1 tablet by mouth daily.    . colchicine 0.6 MG tablet Take 2 tablets now and 1 tablet an hour later for flare up (Patient taking differently: Take 0.6-1.2 mg by mouth daily as needed (gout). ) 90 tablet 0  . diclofenac Sodium (VOLTAREN) 1 % GEL Apply 4 g topically 4 (four) times daily. As needed for knee pain 150 g 1  . diphenhydrAMINE (BENADRYL) 25 MG tablet Take 25 mg by mouth daily as needed for itching.    . DULoxetine (CYMBALTA) 60 MG capsule Take 1 capsule (60 mg total) by mouth daily. 30 capsule 2  . hydrOXYzine (VISTARIL) 25 MG capsule Take 25 mg by mouth 2 (two) times daily as needed for anxiety.    Javier Docker Oil 350 MG CAPS Take 700 mg by mouth daily.    Marland Kitchen levothyroxine (SYNTHROID) 75 MCG tablet Take 1 tablet (75 mcg total) by mouth daily before breakfast. 90 tablet 1  . Magnesium 500 MG CAPS Take 500 mg by mouth daily.    . Melatonin 5 MG/15ML LIQD Take 10 mg by mouth at bedtime.     . methocarbamol (ROBAXIN-750)  750 MG tablet Take 1 tablet (750 mg total) by mouth 3 (three) times daily as needed for muscle spasms.  90 tablet 1  . metoprolol succinate (TOPROL-XL) 25 MG 24 hr tablet TAKE 1/2 TABLET(12.5 MG) BY MOUTH DAILY (Patient taking differently: Take 12.5 mg by mouth daily. ) 45 tablet 0  . olmesartan-hydrochlorothiazide (BENICAR HCT) 40-12.5 MG tablet TAKE 1 TABLET BY MOUTH DAILY 90 tablet 1  . ondansetron (ZOFRAN ODT) 4 MG disintegrating tablet Take 1 tablet (4 mg total) by mouth every 8 (eight) hours as needed for nausea or vomiting. 20 tablet 0  . oxyCODONE-acetaminophen (PERCOCET) 7.5-325 MG tablet Take 1 tablet by mouth every 4 (four) hours as needed for moderate pain.     . polyethylene glycol powder (MIRALAX) powder Take 17 g by mouth daily as needed for moderate constipation.     . primidone (MYSOLINE) 50 MG tablet TAKE 1 TABLET(50 MG) BY MOUTH AT BEDTIME (Patient taking differently: Take 50 mg by mouth at bedtime. ) 90 tablet 1  . rOPINIRole (REQUIP) 0.5 MG tablet Take 1.5 tablets (0.75 mg total) by mouth at bedtime. 140 tablet 1  . vitamin B-12 (CYANOCOBALAMIN) 1000 MCG tablet Take 1,000 mcg by mouth daily.    . furosemide (LASIX) 20 MG tablet TAKE 1 TABLET BY MOUTH DAILY AS NEEDED (Patient not taking: No sig reported) 90 tablet 1    Drug Regimen Review Drug regimen was reviewed and remains appropriate with no significant issues identified  Home: Home Living Family/patient expects to be discharged to:: Private residence Living Arrangements: Spouse/significant other Available Help at Discharge: Family, Available 24 hours/day Type of Home: House Home Access: Stairs to enter [employer] of Steps: 2 Entrance Stairs-Rails: None Home Layout: Two level Alternate Level Stairs-Number of Steps: flight Alternate Level Stairs-Rails: Can reach both Bathroom Shower/Tub: Gaffer, Chiropodist: Handicapped height Home Equipment: Environmental consultant - 2 wheels, [place] - single  point, Grab bars - tub/shower, Environmental consultant - 4 wheels, Bedside commode, Shower seat, Tub bench   Functional History: Prior Function Level of Independence: Needs assistance Gait / Transfers Assistance Needed: Since back surgery has only been able to tolerate short distances. Per daughter, has been mostly in the bed secondary to pain.  ADL's / Homemaking Assistance Needed: Required assist for bathing/dressing. Per daughter, has only taken 2 showers since surgery.   Functional Status:  Mobility: Bed Mobility Overal bed mobility: Needs Assistance Bed Mobility: Rolling, Sidelying to Sit, Sit to Sidelying Rolling: Min guard Sidelying to sit: Min assist Sit to sidelying: Mod assist General bed mobility comments: min assist to transition to EOB and mod assist to return to sidelying for BLE support; limited by pain and requires increased time and effort; cueing for PLB & sequencing for log roll Transfers Overall transfer level: Needs assistance Equipment used: Rolling walker (2 wheeled) Transfers: Sit to/from Stand Sit to Stand: Min assist General transfer comment: min assist to steady RW for initiation. cueing for hand placement Ambulation/Gait Ambulation/Gait assistance: Min guard, Min assist Gait Distance (Feet): 200 Feet Assistive device: Rolling walker (2 wheeled) Gait Pattern/deviations: Step-through pattern, Decreased stride length General Gait Details: Pt required CGA throughout ambulation due to fear of fatigue with last session.  Adequate fatigue and minimal increase in pain following ambulation today. Gait velocity: decreased    ADL: ADL Overall ADL's : Needs assistance/impaired Grooming: Min guard, Standing, Wash/dry hands Upper Body Bathing: Min guard, Sitting Lower Body Bathing: Maximal assistance, Sit to/from stand Upper Body Dressing : Minimal assistance, Sitting Lower Body Dressing: Maximal assistance, Sit to/from stand Toilet Transfer: Minimal assistance, Ambulation, RW,  Regular Toilet, Grab bars Toileting-  Clothing Manipulation and Hygiene: Moderate assistance, Sitting/lateral lean Functional mobility during ADLs: Minimal assistance, Rolling walker General ADL Comments: pt limited by back pain and decreased activity tolerance  Cognition: Cognition Overall Cognitive Status: Within Functional Limits for tasks assessed Orientation Level: Oriented X4 Cognition Arousal/Alertness: Awake/alert Behavior During Therapy: WFL for tasks assessed/performed Overall Cognitive Status: Within Functional Limits for tasks assessed  Physical Exam: Blood pressure 133/67, pulse 68, temperature 98.8 F (37.1 C), temperature source Oral, resp. rate 18, height '5\' 7"'  (1.702 m), weight 112 kg, SpO2 99 %. Physical Exam  Vitals reviewed. Constitutional: She appears well-developed.  Morbidly obese  HENT:  Head: Normocephalic and atraumatic.  Eyes: EOM are normal. Right eye exhibits no discharge. Left eye exhibits no discharge.  Neck: No tracheal deviation present. No thyromegaly present.  Respiratory: Effort normal. No respiratory distress.  GI: Soft. She exhibits no distension.  Musculoskeletal:     Comments: No edema or tenderness in extremities  Neurological: She is alert.  Follows commands.   Oriented x3.   Cooperative with exam. Generalized tremor noted Motor: Bilateral upper extremities: 4-4+/5 proximal distal Bilateral lower extremities: 4-4+/5 proximal distal Sensation diminished to light touch distal extremities  Skin:  Back incision clean and dry  Psychiatric: She has a normal mood and affect. Her behavior is normal.    Results for orders placed or performed during the hospital encounter of 06/24/19 (from the past 48 hour(s))  Basic metabolic panel     Status: Abnormal   Collection Time: 06/27/19  3:19 AM  Result Value Ref Range   Sodium 135 135 - 145 mmol/L   Potassium 5.5 (H) 3.5 - 5.1 mmol/L   Chloride 105 98 - 111 mmol/L   CO2 20 (L) 22 - 32 mmol/L    Glucose, Bld 250 (H) 70 - 99 mg/dL    Comment: Glucose reference range applies only to samples taken after fasting for at least 8 hours.   BUN 39 (H) 8 - 23 mg/dL   Creatinine, Ser 1.18 (H) 0.44 - 1.00 mg/dL   Calcium 9.8 8.9 - 10.3 mg/dL   GFR calc non Af Amer 46 (L) >60 mL/min   GFR calc Af Amer 53 (L) >60 mL/min   Anion gap 10 5 - 15    Comment: Performed at [facility] [address] [place], [place] [address]  Potassium     Status: None   Collection Time: 06/27/19 11:17 AM  Result Value Ref Range   Potassium 4.9 3.5 - 5.1 mmol/L    Comment: Performed at [facility] [address], [place], [place] [address]  Basic metabolic panel     Status: Abnormal   Collection Time: 06/28/19  1:46 AM  Result Value Ref Range   Sodium 135 135 - 145 mmol/L   Potassium 5.1 3.5 - 5.1 mmol/L   Chloride 100 98 - 111 mmol/L   CO2 23 22 - 32 mmol/L   Glucose, Bld 227 (H) 70 - 99 mg/dL    Comment: Glucose reference range applies only to samples taken after fasting for at least 8 hours.   BUN 41 (H) 8 - 23 mg/dL   Creatinine, Ser 1.25 (H) 0.44 - 1.00 mg/dL   Calcium 9.7 8.9 - 10.3 mg/dL   GFR calc non Af Amer 43 (L) >60 mL/min   GFR calc Af Amer 50 (L) >60 mL/min   Anion gap 12 5 - 15    Comment: Performed at [facility], [address] [place], [place] [address]   No results found.   Medical Problem List and Plan: 1.  Decreased functional mobility secondary to acute on chronic back pain S/P recent L2-L3 fusion 05/30/2019.  Decadron taper per neurosurgery  -patient may shower after coming incision  -ELOS/Goals: 8-13 days/supervision/min a  Admit to CIR 2.  Antithrombotics: -DVT/anticoagulation: SCDs.    Vascular study ordered  -antiplatelet therapy: N/A 3. Pain Management: Voltaren gel 4 times daily, Cymbalta 60 mg daily, Robaxin-750 mig 3 times daily, MS Contin 15 mg every 12 hours, oxycodone as needed for breakthrough pain  Monitor with increased exertion 4.  Mood: Provide emotional support  -antipsychotic agents: N/A 5. Neuropsych: This patient is capable of making decisions on her own behalf. 6. Skin/Wound Care: Routine skin checks 7. Fluids/Electrolytes/Nutrition: Routine in and outs.  CMP ordered 8.  Acute on chronic anemia.  CBC ordered 9.  AKI on CKD stage III.  CMP ordered 10.  Hypertension.  Toprol-XL 12.5 mg daily.    Monitor with increased mobility 11.  Obesity.  BMI 38.67.  Dietary follow-up 12.  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  Follow-up outpatient Dr. Jana Hakim.  Continue Armidex 13.  Hyperlipidemia.  Lipitor 14.  History of gout.  Continue Zyloprim. 15.  Hypothyroidism.  TSH 1.420.  Continue Synthroid 16.  Chronic constipation.  Adjust bowel meds as necessary 17.  COPD/quit smoking 13 months ago.  Check oxygen saturations every shift 18.  Tremors with restless leg syndrome.  Continue Requip as well as Mysoline  Cathlyn Parsons, PA-C  06/28/2019  I have personally performed a face to face diagnostic evaluation, including, but not limited to relevant history and physical exam findings, of this patient and developed relevant assessment and plan.  Additionally, I have reviewed and concur with the physician assistant's documentation above.  Delice Lesch, MD, ABPMR

## 2019-06-28 NOTE — Discharge Summary (Signed)
Physician Discharge Summary  Brandi Stephens [id number] DOB: [birth year] DOA: 06/24/2019  PCP: Girtha Rm, NP-C  Admit date: 06/24/2019 Discharge date: 06/28/2019  Admitted From: Home  Disposition:  Inpatient rehab  Recommendations for Outpatient Follow-up and new medication changes:  1. Follow up with [facility], NP-C in 7 days.  2. Continue sodium zirconium for 3 doses and follow renal panel in am, follow on potassium. 3. Patient will resume furosemide and will dc olmesartan due to hyperkalemia.  4. Patient has been placed on bid morphine and as needed oxycodone for break through pain.  5. Follow up with neurosurgery as scheduled.   Home Health: na  Equipment/Devices: na    Discharge Condition: stable  CODE STATUS: full  Diet recommendation: heart healthy diet.   Brief/Interim Summary: Patient was admitted to the hospital with a working diagnosis of worsening postoperative back pain complicated by acute kidney injury and ambulatory dysfunction.  74 year old female who presented with back pain, left leg weakness and abnormal blood work. She does have significant past medical history for hypertension, hypothyroidism, COPD, chronic kidney disease stage III, lumbar spinal stenosis status post recent decompression and fusion. Her surgery was about a month ago with additional procedure earlythis month. Shereported worsening low back pain with radiation towards the groin bilaterally. Positive ambulatorydysfunction with falls. On her initial physical examination blood pressure 98/48, pulse rate 92, respiratory rate 17, oxygen saturation 93%. Moist mucousmembranes, her lungs were clear to auscultation bilaterally, heart S1-S2 present rhythmic, soft abdomen, no lower extremity edema, left lower extremity strength was limited due to pain.  Sodium 132, potassium 5.2, chloride 99, bicarb 24, glucose 148, BUN 68, creatinine 3.11, AST 44, ALT 46, white count 12.7, hemoglobin 9.7,  hematocrit 30.1, platelets 372. MRIwith postoperative changes from recent extension of posterior decompression and fusion L2-L3 level. Marrow edema within the L2 and L3 vertebral bodies. 5.0 x 4 x 7-7.4 cm heterogeneous collection within theposterior paraspinal soft tissue extending from L3 through L5-S1.   Patient was evaluated by neurosurgery, no signs of infection, recommended continue pain control and follow up with physical and occupational therapy.   Pain controlled with analgesics, renal function stabilized, potassium in the high normal range.   Patient has been accepted at inpatient rehab.   1.  Acute on chronic back pain status post recent L2-L3 fusion.  Patient was admitted to the medical ward, she underwent further MRI as noted above, it was reviewed by neurosurgery, no signs of infection, recommendations to continue pain control and physical therapy.  Patient had persistent back pain that improved with long-acting analgesia with MS Contin 15 mg twice daily, plus breakthrough pain control with oxycodone.  Patient had topical diclofenac and scheduled acetaminophen along with methocarbamol with good toleration.  She was treated with steroids that will be tapered as an outpatient, Medrol Dosepak.  Follow-up as an outpatient with neurosurgery as scheduled.   2.  Acute kidney injury on chronic kidney disease stage IIIb, hyponatremia and hyperkalemia.  Patient received intravenous fluids and supportive medical therapy, her discharge creatinine is 1.25, bicarb 23, chloride 100, potassium 5.1 and sodium 135.  Olmesartan and hydrochlorothiazide were held during her hospitalization.  At discharge patient will resume furosemide, hold on olmesartan due to risk of hyperkalemia.  Patient will continue sodium zirconium for 3 doses, follow-up potassium in a.m.   3.  Hypertension.  Continue metoprolol for blood pressure control.  4.  Hypothyroidism.  Continue levothyroxine.  5.  COPD.  No  signs of  acute exacerbation  6.  Chronic multifactorial anemia.  Her hemoglobin remained stable, follow-up as an outpatient.  7.  Obesity class II/dyslipidemia.  Calculated BMI 38, continue atorvastatin, follow-up as an outpatient.  8.  Steroid-induced hyperglycemia.  Patient will have a steroid taper at discharge.  9.  Restless leg syndrome, depression, tremors.  Patient continued on ropinirole, primidone, melatonin and duloxetine.  10.  History of breast cancer, invasive ductal carcinoma grade 1.  Continue anastrozole, follow-up with Dr. Jana Hakim as an outpatient.  Discharge Diagnoses:  Principal Problem:   Acute renal failure superimposed on stage 3 chronic kidney disease (HCC) Active Problems:   Macrocytic anemia   Essential hypertension   Hypothyroidism   Lumbar spinal stenosis   Grade I diastolic dysfunction   Acute osteomyelitis of lumbar spine (HCC)    Discharge Instructions   Allergies as of 06/28/2019      Reactions   Ampicillin Hives, Other (See Comments)   Severe reaction in February 2017 1.5 month to have hives to go away   Penicillins Hives, Other (See Comments)   Severe reaction in February 2017 1.5 month to have hives to go away Has patient had a PCN reaction causing immediate rash, facial/tongue/throat swelling, SOB or lightheadedness with hypotension: #  #  #  YES  #  #  #  Has patient had a PCN reaction causing severe rash involving mucus membranes or skin necrosis: No Has patient had a PCN reaction that required hospitalization No Has patient had a PCN reaction occurring within the last 10 years: No   Gabapentin Swelling   UNSPECIFIED REACTION    Other Itching   UNSPECIFIED Analgesics   Codeine Nausea And Vomiting   Hydrocodone-acetaminophen Itching   Hydromorphone Itching   Pt has taken w/o issues      Medication List    STOP taking these medications   olmesartan-hydrochlorothiazide 40-12.5 MG tablet Commonly known as: BENICAR HCT    oxyCODONE-acetaminophen 7.5-325 MG tablet Commonly known as: PERCOCET     TAKE these medications   acetaminophen 325 MG tablet Commonly known as: TYLENOL Take 2 tablets (650 mg total) by mouth every 6 (six) hours as needed for moderate pain.   albuterol 108 (90 Base) MCG/ACT inhaler Commonly known as: VENTOLIN HFA INHALE 2 PUFFS BY MOUTH EVERY 4 TO 6 HOURS AS NEEDED What changed:   how much to take  how to take this  when to take this  reasons to take this  additional instructions   allopurinol 300 MG tablet Commonly known as: ZYLOPRIM TAKE 1 TABLET(300 MG) BY MOUTH DAILY What changed: See the new instructions.   anastrozole 1 MG tablet Commonly known as: ARIMIDEX TAKE 1 TABLET(1 MG) BY MOUTH DAILY What changed: See the new instructions.   atorvastatin 20 MG tablet Commonly known as: LIPITOR TAKE 1 TABLET BY MOUTH EVERY DAY   Biotin 5000 5 MG Caps Generic drug: Biotin Take 5 mg by mouth daily.   CALCIUM 1000 + D PO Take 1 tablet by mouth daily.   colchicine 0.6 MG tablet Take 2 tablets now and 1 tablet an hour later for flare up What changed:   how much to take  how to take this  when to take this  reasons to take this  additional instructions   diclofenac Sodium 1 % Gel Commonly known as: Voltaren Apply 4 g topically 4 (four) times daily. As needed for knee pain   diphenhydrAMINE 25 MG tablet Commonly known as: BENADRYL Take 25 mg  by mouth daily as needed for itching.   DULoxetine 60 MG capsule Commonly known as: Cymbalta Take 1 capsule (60 mg total) by mouth daily.   furosemide 40 MG tablet Commonly known as: Lasix Take 1 tablet (40 mg total) by mouth daily. What changed:   medication strength  how much to take  when to take this  reasons to take this   hydrOXYzine 25 MG capsule Commonly known as: VISTARIL Take 25 mg by mouth 2 (two) times daily as needed for anxiety.   Krill Oil 350 MG Caps Take 700 mg by mouth daily.    levothyroxine 75 MCG tablet Commonly known as: SYNTHROID Take 1 tablet (75 mcg total) by mouth daily before breakfast.   Magnesium 500 MG Caps Take 500 mg by mouth daily.   Melatonin 5 MG/15ML Liqd Take 10 mg by mouth at bedtime.   methocarbamol 750 MG tablet Commonly known as: Robaxin-750 Take 1 tablet (750 mg total) by mouth 3 (three) times daily as needed for muscle spasms.   methylPREDNISolone 2 MG tablet Commonly known as: Medrol Take two tablets 3 times daily for three days, then take two tablets 2 times daily for three days, then take two tablets daily for three days, then take one tablet daily for 3 days, then stop.   metoprolol succinate 25 MG 24 hr tablet Commonly known as: TOPROL-XL TAKE 1/2 TABLET(12.5 MG) BY MOUTH DAILY What changed: See the new instructions.   MiraLax 17 GM/SCOOP powder Generic drug: polyethylene glycol powder Take 17 g by mouth daily as needed for moderate constipation.   morphine 15 MG 12 hr tablet Commonly known as: MS CONTIN Take 1 tablet (15 mg total) by mouth every 12 (twelve) hours.   ondansetron 4 MG disintegrating tablet Commonly known as: Zofran ODT Take 1 tablet (4 mg total) by mouth every 8 (eight) hours as needed for nausea or vomiting.   oxyCODONE 5 MG immediate release tablet Commonly known as: Oxy IR/ROXICODONE Take 1 tablet (5 mg total) by mouth every 4 (four) hours as needed for severe pain.   primidone 50 MG tablet Commonly known as: MYSOLINE TAKE 1 TABLET(50 MG) BY MOUTH AT BEDTIME What changed: See the new instructions.   rOPINIRole 0.5 MG tablet Commonly known as: REQUIP Take 1.5 tablets (0.75 mg total) by mouth at bedtime.   sodium zirconium cyclosilicate 5 g packet Commonly known as: LOKELMA Take 5 g by mouth 3 (three) times daily.   vitamin B-12 1000 MCG tablet Commonly known as: CYANOCOBALAMIN Take 1,000 mcg by mouth daily.      Follow-up Information    Henson, Vickie L, NP-C Follow up in 1 week(s).    Specialty: Family Medicine Contact information: [address] [place]  [address] [phone]          Allergies  Allergen Reactions  . Ampicillin Hives and Other (See Comments)    Severe reaction in February 2017 1.5 month to have hives to go away  . Penicillins Hives and Other (See Comments)    Severe reaction in February 2017 1.5 month to have hives to go away Has patient had a PCN reaction causing immediate rash, facial/tongue/throat swelling, SOB or lightheadedness with hypotension: #  #  #  YES  #  #  #  Has patient had a PCN reaction causing severe rash involving mucus membranes or skin necrosis: No Has patient had a PCN reaction that required hospitalization No Has patient had a PCN reaction occurring within the last 10  years: No   . Gabapentin Swelling    UNSPECIFIED REACTION   . Other Itching    UNSPECIFIED Analgesics  . Codeine Nausea And Vomiting  . Hydrocodone-Acetaminophen Itching  . Hydromorphone Itching    Pt has taken w/o issues    Consultations:  Neurosurgery    Procedures/Studies: DG Lumbar Spine 2-3 Views  Result Date: 05/30/2019 CLINICAL DATA:  L2-3 PLIF. Prior L3 through S1 fusion. EXAM: OPERATIVE LUMBAR SPINE 2 VIEW(S) COMPARISON:  MRI lumbar spine 03/31/2019. FINDINGS: Two spot images from the C-arm fluoroscopic device, AP and LATERAL views of the lumbar spine are submitted for interpretation post-operatively. The patient has undergone additional L2-3 fusion and now has hardware spanning L2 through S1 and disc prostheses at every level from L2-3 through L5-S1. The radiologic technologist documented 20 seconds of fluoroscopy time. IMPRESSION: L2-3 fusion, with hardware now spanning L2 through S1 and disc prostheses at every level from L2-3 through L5-S1. Electronically Signed   By: Evangeline Dakin M.D.   On: 05/30/2019 16:49   MR LUMBAR SPINE WO CONTRAST  Result Date: 06/25/2019 CLINICAL DATA:  Initial evaluation for acute left leg  weakness. Recent surgery on 05/30/2019. EXAM: MRI LUMBAR SPINE WITHOUT CONTRAST TECHNIQUE: Multiplanar, multisequence MR imaging of the lumbar spine was performed. No intravenous contrast was administered. COMPARISON:  Previous MRI from 03/31/2019. FINDINGS: Segmentation: Standard. Lowest well-formed disc space labeled the L5-S1 level. Alignment: Trace retrolisthesis of L1 on L2. Alignment otherwise normal with preservation of the normal lumbar lordosis. Vertebrae: Postoperative changes from recent extension of posterior decompression and fusion to the L2-3 level, with sequelae of prior PLIF now seen at this level. There is prominent marrow edema within the L2 and L3 vertebral bodies. Edema extends into the adjacent psoas musculature bilaterally, left greater than right (series 10, image 16). While these findings may be postoperative in nature, appearance raises the concern for possible infection/osteomyelitis. There is question of a circumferential epidural collection at the level of L2-3 (series 10, image 15), difficult to see given adjacent susceptibility artifact. Compression of the thecal sac with persistent fairly severe spinal stenosis. Slight inferior migration of epidural collection within the ventral epidural space to the level of L3 (series 10, image 19). Elsewhere, the remainder of the fusion extending from L3 through S1 is stable in appearance. Vertebral body height maintained without acute or interval fracture. No discrete osseous lesions. No other abnormal marrow edema. Conus medullaris and cauda equina: Conus extends to the superior aspect of L1. Partially visualized conus and distal cord normal in appearance. Proximal nerve roots of the cauda equina somewhat irregular and undulating superior to the L2-3 level secondary to stenosis. Paraspinal and other soft tissues: Postoperative changes from recent posterior decompression with fusion at L2-3. Diffuse edema seen throughout the posterior paraspinous  musculature. There is a residual collection along the mid posterior soft tissues extending from approximately L3-4 through L5-S1. This measures approximately 5.0 x 4.7 x 7.4 cm (series 10, image 29). Collection is heterogeneous with a few scattered internal fluid-fluid levels, which could reflect complex postoperative seroma or hematoma. Superimposed infection not excluded. Additional smaller more simple appearing collection within the subcutaneous fat along the midline incision measures 2.0 x 1.6 x 4.3 cm (series 10, image 28). Edema within the left greater than right psoas musculature adjacent to the L2-3 interspace as above. No definite loculated collection within either psoas muscle, although evaluation limited given lack of IV contrast. Disc levels: T12-L1: Unremarkable. L1-2: Mild diffuse disc bulge with disc desiccation. Disc  bulging eccentric to the left. Bilateral facet hypertrophy. Progressive mild spinal stenosis. Mild left L1 foraminal narrowing. No significant right foraminal stenosis. L2-3: Postoperative changes from interval decompressive laminectomy with facetectomy with posterior and interbody fusion. Suspected circumferential epidural collection compresses the thecal sac. Persistent severe spinal stenosis, with the thecal sac measuring approximately 7 mm in AP I diameter at its most narrow point. Foramina are grossly patent. L3-4: Prior PLIF. Mild spinal stenosis related to the posterior soft tissue collection. Foramina remain grossly patent. L4-5: Prior PLIF. Mild to moderate spinal stenosis related to the posterior soft tissue collection. Foramina remain patent. L5-S1: Prior PLIF. Thecal sac patency grossly stable. No new foraminal narrowing. IMPRESSION: 1. Postoperative changes from recent extension of posterior decompression and fusion to the L2-3 level. Prominent marrow edema within the L2 and L3 vertebral bodies with soft tissue edema within the adjacent psoas musculature as above. While  these findings may in part be postoperative in nature, appearance raises the concern for possible infection/osteomyelitis. Correlation with symptomatology and laboratory values recommended. Additionally, follow-up examination with postcontrast imaging may be helpful for further evaluation as warranted. 2. Persistent severe stenosis at the level of L2-3 with question circumferential epidural collection as above. Finding difficult to be certain given adjacent susceptibility artifact. Again, postcontrast imaging may be helpful. Additionally, correlation with dedicated CT with contrast could also be performed as well. 3. Approximate 5.0 x 4.7 x 7.4 cm heterogeneous collection within the posterior paraspinous soft tissues extending from L3 through L5-S1. Differential considerations include a postoperative hematoma versus complicated seroma. Superimposed infection could be considered in the correct clinical setting. Electronically Signed   By: Jeannine Boga M.D.   On: 06/25/2019 02:56   DG C-Arm 1-60 Min  Result Date: 05/30/2019 CLINICAL DATA:  L2-3 PLIF. Prior L3 through S1 fusion. EXAM: OPERATIVE LUMBAR SPINE 2 VIEW(S) COMPARISON:  MRI lumbar spine 03/31/2019. FINDINGS: Two spot images from the C-arm fluoroscopic device, AP and LATERAL views of the lumbar spine are submitted for interpretation post-operatively. The patient has undergone additional L2-3 fusion and now has hardware spanning L2 through S1 and disc prostheses at every level from L2-3 through L5-S1. The radiologic technologist documented 20 seconds of fluoroscopy time. IMPRESSION: L2-3 fusion, with hardware now spanning L2 through S1 and disc prostheses at every level from L2-3 through L5-S1. Electronically Signed   By: Evangeline Dakin M.D.   On: 05/30/2019 16:49   CT RENAL STONE STUDY  Result Date: 06/24/2019 CLINICAL DATA:  Atypical back pain EXAM: CT ABDOMEN AND PELVIS WITHOUT CONTRAST TECHNIQUE: Multidetector CT imaging of the abdomen and  pelvis was performed following the standard protocol without IV contrast. COMPARISON:  CT 02/20/2018 FINDINGS: Lower chest: Lung bases demonstrate no acute consolidation or effusion. Heart size within normal limits. Hepatobiliary: No focal liver abnormality is seen. No gallstones, gallbladder wall thickening, or biliary dilatation. Pancreas: Unremarkable. No pancreatic ductal dilatation or surrounding inflammatory changes. Spleen: Normal in size without focal abnormality. Adrenals/Urinary Tract: Adrenal glands are normal. Kidneys show no hydronephrosis. Probable cyst within the mid to lower left kidney. No ureteral stone is seen. The bladder is unremarkable. Stomach/Bowel: Stomach is nonenlarged. No dilated small bowel. Postsurgical changes of the right colon. No bowel wall thickening. Scattered diverticular disease without acute inflammatory process. Vascular/Lymphatic: Extensive aortic atherosclerosis. No aneurysm. No significant adenopathy. Reproductive: Status post hysterectomy. No adnexal masses. Other: Negative for free air or free fluid. Musculoskeletal: Posterior fusion changes L2 through S1 with interbody devices at L2-L3, L3-L4, L4-L5 and L5-S1. Breach  of the superior endplate by fixating screws on both sides at L2 with some lucency surrounding the screws. IMPRESSION: 1. Negative for hydronephrosis or ureteral stone. 2. Diverticular disease of the colon without acute inflammatory process 3. Post fusion changes of the lumbar spine L2 through S1. Fixating screws at L2 breech the superior endplate on both sides and there appears to be some lucency surrounding the fixating screws. Hardware otherwise appears intact. Electronically Signed   By: Donavan Foil M.D.   On: 06/24/2019 22:44       Subjective: Patient is feeling well, her back pain is controlled with analgesics, no nausea or vomiting, no chest pain or dyspnea.   Discharge Exam: Vitals:   06/27/19 2224 06/28/19 0432  BP: 137/62 133/67   Pulse: 69 68  Resp: 16 18  Temp: 97.8 F (36.6 C) 98.8 F (37.1 C)  SpO2: 99% 99%   Vitals:   06/27/19 0642 06/27/19 1450 06/27/19 2224 06/28/19 0432  BP: (!) 142/63 139/72 137/62 133/67  Pulse: 71 72 69 68  Resp: 18 18 16 18   Temp: 97.7 F (36.5 C) 98.1 F (36.7 C) 97.8 F (36.6 C) 98.8 F (37.1 C)  TempSrc: Oral Oral Oral Oral  SpO2: 99% 97% 99% 99%  Weight:      Height:        General: Not in pain or dyspnea Neurology: Awake and alert, non focal  E ENT: no pallor, no icterus, oral mucosa moist Cardiovascular: No JVD. S1-S2 present, rhythmic, no gallops, rubs, or murmurs. No lower extremity edema. Pulmonary: positive breath sounds bilaterally, adequate air movement, no wheezing, rhonchi or rales. Gastrointestinal. Abdomen with no organomegaly, non tender, no rebound or guarding Skin. No rashes Musculoskeletal: no joint deformities   The results of significant diagnostics from this hospitalization (including imaging, microbiology, ancillary and laboratory) are listed below for reference.     Microbiology: Recent Results (from the past 240 hour(s))  Urine Culture     Status: None   Collection Time: 06/24/19  2:59 PM   Specimen: Urine   UR  Result Value Ref Range Status   Urine Culture, Routine Final report  Final   Organism ID, Bacteria Comment  Final    Comment: Mixed urogenital flora 25,000-50,000 colony forming units per mL   SARS CORONAVIRUS 2 (TAT 6-24 HRS) Nasopharyngeal Nasopharyngeal Swab     Status: None   Collection Time: 06/24/19 10:57 PM   Specimen: Nasopharyngeal Swab  Result Value Ref Range Status   SARS Coronavirus 2 NEGATIVE NEGATIVE Final    Comment: (NOTE) SARS-CoV-2 target nucleic acids are NOT DETECTED. The SARS-CoV-2 RNA is generally detectable in upper and lower respiratory specimens during the acute phase of infection. Negative results do not preclude SARS-CoV-2 infection, do not rule out co-infections with other pathogens, and should  not be used as the sole basis for treatment or other patient management decisions. Negative results must be combined with clinical observations, patient history, and epidemiological information. The expected result is Negative. Fact Sheet for Patients: SugarRoll.be Fact Sheet for Healthcare Providers: https://www.woods-mathews.com/ This test is not yet approved or cleared by the [place] FDA and  has been authorized for detection and/or diagnosis of SARS-CoV-2 by FDA under an Emergency Use Authorization (EUA). This EUA will remain  in effect (meaning this test can be used) for the duration of the COVID-19 declaration under Section 56 4(b)(1) of the Act, 21 U.S.C. section 360bbb-3(b)(1), unless the authorization is terminated or revoked sooner. Performed at [facility]  [facility], [address] [place], [place] [address]   Blood culture (routine x 2)     Status: Abnormal   Collection Time: 06/25/19  4:15 AM   Specimen: BLOOD RIGHT FOREARM  Result Value Ref Range Status   Specimen Description BLOOD RIGHT FOREARM  Final   Special Requests   Final    BOTTLES DRAWN AEROBIC AND ANAEROBIC Blood Culture adequate volume   Culture  Setup Time   Final    IN BOTH AEROBIC AND ANAEROBIC BOTTLES GRAM POSITIVE COCCI IN CLUSTERS CRITICAL RESULT CALLED TO, READ BACK BY AND VERIFIED WITH: L SEAY PHARMD 06/26/19 0003 JDW    Culture (A)  Final    STAPHYLOCOCCUS SPECIES (COAGULASE NEGATIVE) THE SIGNIFICANCE OF ISOLATING THIS ORGANISM FROM A SINGLE SET OF BLOOD CULTURES WHEN MULTIPLE SETS ARE DRAWN IS UNCERTAIN. PLEASE NOTIFY THE MICROBIOLOGY DEPARTMENT WITHIN ONE WEEK IF SPECIATION AND SENSITIVITIES ARE REQUIRED. Performed at [facility], [place] [address], [place], [place] [address]    Report Status 06/27/2019 FINAL  Final  Blood culture (routine x 2)     Status: None (Preliminary result)   Collection Time: 06/25/19  4:18 AM   Specimen: BLOOD  Result  Value Ref Range Status   Specimen Description BLOOD LEFT ANTECUBITAL  Final   Special Requests   Final    BOTTLES DRAWN AEROBIC AND ANAEROBIC Blood Culture adequate volume   Culture   Final    NO GROWTH 3 DAYS Performed at [facility], [place] [address] [place],  [address]    Report Status PENDING  Incomplete     Labs: BNP (last 3 results) Recent Labs    11/07/18 1520  BNP 0000000   Basic Metabolic Panel: Recent Labs  Lab 06/24/19 2257 06/24/19 2257 06/25/19 0418 06/26/19 0200 06/27/19 0319 06/27/19 1117 06/28/19 0146  NA 133*  --  136 137 135  --  135  K 4.6   < > 4.9 4.6 5.5* 4.9 5.1  CL 97*  --  100 105 105  --  100  CO2 24  --  21* 22 20*  --  23  GLUCOSE 132*  --  113* 171* 250*  --  227*  BUN 70*  --  68* 51* 39*  --  41*  CREATININE 2.83*  --  2.48* 1.29* 1.18*  --  1.25*  CALCIUM 9.8  --  9.6 9.5 9.8  --  9.7   < > = values in this interval not displayed.   Liver Function Tests: Recent Labs  Lab 06/24/19 1430 06/24/19 2257  AST 44* 39  ALT 46* 45*  ALKPHOS 126* 104  BILITOT <0.2 0.8  PROT 6.5 7.5  ALBUMIN 3.8 3.3*   No results for input(s): LIPASE, AMYLASE in the last 168 hours. No results for input(s): AMMONIA in the last 168 hours. CBC: Recent Labs  Lab 06/24/19 1430 06/24/19 2257 06/25/19 0912 06/26/19 0200  WBC 12.7* 12.2* 10.1 9.4  NEUTROABS 10.0*  --   --   --   HGB 9.7* 9.7* 9.1* 9.1*  HCT 30.1* 31.6* 29.1* 28.8*  MCV 97 102.6* 100.7* 99.3  PLT 372 342 330 340   Cardiac Enzymes: No results for input(s): CKTOTAL, CKMB, CKMBINDEX, TROPONINI in the last 168 hours. BNP: Invalid input(s): POCBNP CBG: No results for input(s): GLUCAP in the last 168 hours. D-Dimer No results for input(s): DDIMER in the last 72 hours. Hgb A1c No results for input(s): HGBA1C in the last 72 hours. Lipid  Profile No results for input(s): CHOL, HDL, LDLCALC, TRIG, CHOLHDL, LDLDIRECT in the last 72 hours. Thyroid function studies No results for  input(s): TSH, T4TOTAL, T3FREE, THYROIDAB in the last 72 hours.  Invalid input(s): FREET3 Anemia work up No results for input(s): VITAMINB12, FOLATE, FERRITIN, TIBC, IRON, RETICCTPCT in the last 72 hours. Urinalysis    Component Value Date/Time   COLORURINE YELLOW 06/25/2019 0433   APPEARANCEUR CLOUDY (A) 06/25/2019 0433   LABSPEC 1.020 06/25/2019 0433   LABSPEC 1.025 06/24/2019 1426   PHURINE 5.0 06/25/2019 0433   GLUCOSEU NEGATIVE 06/25/2019 0433   HGBUR NEGATIVE 06/25/2019 0433   BILIRUBINUR SMALL (A) 06/25/2019 0433   BILIRUBINUR moderate (A) 06/24/2019 1426   BILIRUBINUR n 11/25/2015 1712   KETONESUR NEGATIVE 06/25/2019 0433   PROTEINUR NEGATIVE 06/25/2019 0433   UROBILINOGEN negative 11/25/2015 1712   NITRITE NEGATIVE 06/25/2019 0433   LEUKOCYTESUR MODERATE (A) 06/25/2019 0433   Sepsis Labs Invalid input(s): PROCALCITONIN,  WBC,  LACTICIDVEN Microbiology Recent Results (from the past 240 hour(s))  Urine Culture     Status: None   Collection Time: 06/24/19  2:59 PM   Specimen: Urine   UR  Result Value Ref Range Status   Urine Culture, Routine Final report  Final   Organism ID, Bacteria Comment  Final    Comment: Mixed urogenital flora 25,000-50,000 colony forming units per mL   SARS CORONAVIRUS 2 (TAT 6-24 HRS) Nasopharyngeal Nasopharyngeal Swab     Status: None   Collection Time: 06/24/19 10:57 PM   Specimen: Nasopharyngeal Swab  Result Value Ref Range Status   SARS Coronavirus 2 NEGATIVE NEGATIVE Final    Comment: (NOTE) SARS-CoV-2 target nucleic acids are NOT DETECTED. The SARS-CoV-2 RNA is generally detectable in upper and lower respiratory specimens during the acute phase of infection. Negative results do not preclude SARS-CoV-2 infection, do not rule out co-infections with other pathogens, and should not be used as the sole basis for treatment or other patient management decisions. Negative results must be combined with clinical observations, patient  history, and epidemiological information. The expected result is Negative. Fact Sheet for Patients: SugarRoll.be Fact Sheet for Healthcare Providers: https://www.woods-mathews.com/ This test is not yet approved or cleared by the [place] FDA and  has been authorized for detection and/or diagnosis of SARS-CoV-2 by FDA under an Emergency Use Authorization (EUA). This EUA will remain  in effect (meaning this test can be used) for the duration of the COVID-19 declaration under Section 56 4(b)(1) of the Act, 21 U.S.C. section 360bbb-3(b)(1), unless the authorization is terminated or revoked sooner. Performed at [facility], [place] [address], [place], [place] [address]   Blood culture (routine x 2)     Status: Abnormal   Collection Time: 06/25/19  4:15 AM   Specimen: BLOOD RIGHT FOREARM  Result Value Ref Range Status   Specimen Description BLOOD RIGHT FOREARM  Final   Special Requests   Final    BOTTLES DRAWN AEROBIC AND ANAEROBIC Blood Culture adequate volume   Culture  Setup Time   Final    IN BOTH AEROBIC AND ANAEROBIC BOTTLES GRAM POSITIVE COCCI IN CLUSTERS CRITICAL RESULT CALLED TO, READ BACK BY AND VERIFIED WITH: L SEAY PHARMD 06/26/19 0003 JDW    Culture (A)  Final    STAPHYLOCOCCUS SPECIES (COAGULASE NEGATIVE) THE SIGNIFICANCE OF ISOLATING THIS ORGANISM FROM A SINGLE SET OF BLOOD CULTURES WHEN MULTIPLE SETS ARE DRAWN IS UNCERTAIN. PLEASE NOTIFY THE MICROBIOLOGY DEPARTMENT WITHIN ONE WEEK IF SPECIATION AND SENSITIVITIES ARE  REQUIRED. Performed at [facility], [place] [address], [place], [place] [address]    Report Status 06/27/2019 FINAL  Final  Blood culture (routine x 2)     Status: None (Preliminary result)   Collection Time: 06/25/19  4:18 AM   Specimen: BLOOD  Result Value Ref Range Status   Specimen Description BLOOD LEFT ANTECUBITAL  Final   Special Requests   Final    BOTTLES DRAWN AEROBIC AND ANAEROBIC Blood Culture  adequate volume   Culture   Final    NO GROWTH 3 DAYS Performed at [facility], [place] [address], [place], [place] [address]    Report Status PENDING  Incomplete     Time coordinating discharge: 45 minutes  SIGNED:   Tawni Millers, MD  [facility] 06/28/2019, 11:30 AM

## 2019-06-29 ENCOUNTER — Inpatient Hospital Stay (HOSPITAL_COMMUNITY): Payer: PPO | Admitting: Physical Therapy

## 2019-06-29 ENCOUNTER — Inpatient Hospital Stay (HOSPITAL_COMMUNITY): Payer: PPO

## 2019-06-29 ENCOUNTER — Inpatient Hospital Stay (HOSPITAL_COMMUNITY): Payer: PPO | Admitting: Occupational Therapy

## 2019-06-29 DIAGNOSIS — M48062 Spinal stenosis, lumbar region with neurogenic claudication: Secondary | ICD-10-CM

## 2019-06-29 DIAGNOSIS — Z981 Arthrodesis status: Secondary | ICD-10-CM

## 2019-06-29 DIAGNOSIS — E875 Hyperkalemia: Secondary | ICD-10-CM

## 2019-06-29 DIAGNOSIS — N182 Chronic kidney disease, stage 2 (mild): Secondary | ICD-10-CM

## 2019-06-29 DIAGNOSIS — M5416 Radiculopathy, lumbar region: Secondary | ICD-10-CM

## 2019-06-29 MED ORDER — METHOCARBAMOL 500 MG PO TABS
1000.0000 mg | ORAL_TABLET | Freq: Three times a day (TID) | ORAL | Status: DC
  Administered 2019-06-29 – 2019-07-11 (×34): 1000 mg via ORAL
  Filled 2019-06-29 (×36): qty 2

## 2019-06-29 NOTE — Evaluation (Signed)
Occupational Therapy Assessment and Plan  Patient Details  Name: Brandi Stephens MRN: [id number] Date of Birth: 05/11/1945  OT Diagnosis: lumbago (low back pain) and muscle weakness (generalized) Rehab Potential: Rehab Potential (ACUTE ONLY): Good ELOS: 1 week   Today's Date: 06/29/2019 OT Individual Time: 1000-1100 OT Individual Time Calculation (min): 60 min     Problem List:  Patient Active Problem List   Diagnosis Date Noted  . Lumbar radiculopathy 06/28/2019  . Spinal stenosis of lumbar region 06/28/2019  . Acute on chronic anemia   . Tremors of nervous system   . AKI (acute kidney injury) ([place])   . Chronic pain syndrome   . Acute renal failure superimposed on stage 3 chronic kidney disease ([place]) 06/25/2019  . Acute osteomyelitis of lumbar spine ([place]) 06/25/2019  . Weakness of left lower extremity   . Abdominal pain 06/24/2019  . Nausea 06/24/2019  . Abnormal urine odor 06/24/2019  . History of sepsis 06/24/2019  . Recent major surgery 06/24/2019  . History of diverticulosis 06/24/2019  . Elevated PTHrP level 05/09/2019  . Paresthesia 02/12/2019  . Low back pain 02/12/2019  . Neuropathy 12/20/2018  . Lumbar spinal stenosis 06/29/2018  . Preoperative clearance 06/04/2018  . Abnormal CT of the chest 06/04/2018  . Smoker 05/17/2018  . Hypothyroidism 11/30/2017  . Thoracic aortic atherosclerosis ([place]) 09/14/2016  . Benign essential tremor 09/09/2016  . RLS (restless legs syndrome) 09/09/2016  . H/O adenomatous polyp of colon 07/06/2016  . PVC's (premature ventricular contractions) 03/29/2016  . Preoperative cardiovascular examination 03/29/2016  . Grade I diastolic dysfunction 56/43/3295  . Palpitations 01/11/2016  . Shortness of breath 01/11/2016  . Former cigarette smoker 01/11/2016  . Leg edema 01/11/2016  . Chronic cough 10/28/2015  . 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 Malignant neoplasm of upper-inner quadrant of left breast in female, estrogen  receptor positive ([place]) 07/14/2015  . Prediabetes 07/08/2015  . CKD (chronic kidney disease), stage III 07/08/2015  . Dependent edema 06/08/2015  . Sleep disturbance 06/08/2015  . Mixed hyperlipidemia 06/08/2015  . Generalized anxiety disorder 06/08/2015  . Essential hypertension 06/08/2015  . Emphysema lung ([place]) 06/08/2015  . Gout 06/08/2015  . Macrocytic anemia   . 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 Sepsis ([place]) 05/15/2015  . Lumbar spondylosis 03/20/2015    Past Medical History:  Past Medical History:  Diagnosis Date  . AIN (anal intraepithelial neoplasia) anal canal   . Anemia    with pregnancy  . Anxiety   . Arthritis    knees, lower back, knees  . Chronic constipation   . CKD (chronic kidney disease)   . Coarse tremors    Essential  . COPD (chronic obstructive pulmonary disease) ([place])    2017 chest CT, history of  . Degenerative lumbar spinal stenosis   . Depression   . Drug dependency ([place])   . Elevated PTHrP level 05/09/2019  . GERD (gastroesophageal reflux disease)    pt denies  . Gout   . Grade I diastolic dysfunction 18/84/1660   Noted ECHO  . Hemorrhoids   . History of adenomatous polyp of colon   . History of basal cell carcinoma (BCC) excision 2015   nose  . History of sepsis 05/14/2014   post lumbar surgery  . History of shingles    x2  . Hyperlipidemia   . Hypertension   . Hypothyroidism   . Irregular heart beat    history of while undergoing radiation  . Malignant neoplasm of upper-inner quadrant of  left breast in female, estrogen receptor positive [facility]) oncologist-  dr Jana Hakim--  per lov in epic no recurrence   dx 07-13-2015  Left breast invasive ductal carcinoma, Stage IA, Grade 1 (TXN0),  09-16-2015  s/p  left breast lumpectomy with sln bx's,  completed radiation 11-18-2015,  started antiestrogen therapy 12-14-2015  . Neuropathy    Left foot and back of left leg  . Personal history of radiation therapy    completed 11-18-2015  left  breast  . Pneumonia   . PONV (postoperative nausea and vomiting)   . Prediabetes    diet controlled, no med  . Restless leg syndrome   . Seasonal allergies   . Seizure ([place]) 05/2015   due to sepsis, only one time episode  . Tremor   . Wears partial dentures    lower    Past Surgical History:  Past Surgical History:  Procedure Laterality Date  . ABDOMINAL HYSTERECTOMY  1985  . BASAL CELL CARCINOMA EXCISION  10/15   nose  . BREAST BIOPSY Right 08/24/2015  . BREAST BIOPSY Right 07/13/2015  . BREAST BIOPSY Left 07/13/2015  . BREAST EXCISIONAL BIOPSY Left 1972  . BREAST LUMPECTOMY Left   . BREAST LUMPECTOMY WITH RADIOACTIVE SEED AND SENTINEL LYMPH NODE BIOPSY Left 09/16/2015   Procedure: BREAST LUMPECTOMY WITH RADIOACTIVE SEED AND SENTINEL LYMPH NODE BIOPSY;  Surgeon: Stark Klein, MD;  Location: [facility];  Service: General;  Laterality: Left;  . BREAST REDUCTION SURGERY Bilateral 1998  . CATARACT EXTRACTION W/ INTRAOCULAR LENS IMPLANT Left 2016  . [place]   hallo  . COLONOSCOPY  01/2013   polyps  . EYE SURGERY Left 2016  . HAND SURGERY Left 02-19-2002   dr Fredna Dow  '@[email or web address]'    repair collateral ligament/ MPJ left thumb  . [place]  2008  . HIGH RESOLUTION ANOSCOPY N/A 02/28/2018   Procedure: HIGH RESOLUTION ANOSCOPY WITH BIOPSY;  Surgeon: Leighton Ruff, MD;  Location: [facility];  Service: General;  Laterality: N/A;  . KNEE ARTHROSCOPY Right 2002  . LUMBAR SPINE SURGERY  03-20-2015   dr Kathyrn Sheriff   fusion L4-5, L5-S1  . MOUTH SURGERY  07/14/2018   2 infected teeth removed  . MULTIPLE TOOTH EXTRACTIONS     with bone grafting lower bottom right and left  . REDUCTION MAMMAPLASTY Bilateral   . RIGHT COLECTOMY  09-10-2003   dr Margot Chimes '@[email or web address]'    multiple colon polyps (per path tubular adenoma's, hyperplastic , benign appendix, benign two lymph nodes  . ROTATOR CUFF REPAIR Left 04/2016  . TONSILLECTOMY  1969  . TUBAL  LIGATION Bilateral 1982    Assessment & Plan Clinical Impression: Patient is a 74 y.o. year old female with history significant for obesity with BMI 38.67, hypertension, , history of breast cancer, invasive ductal carcinoma grade 1 followed by Dr. Jana Hakim, hypothyroidism, COPD and quit smoking approximately 13 months ago, tremors as well as restless leg syndrome, chronic constipation, chronic kidney disease stage III, lumbar spinal stenosis post recent decompression and fusion 05/30/2019 per Dr. Kathyrn Sheriff discharge to home supervision level 200 feet with rolling walker.  History taken from chart review and patient.  Patient lives with spouse.  Two-level home 2 steps to entry.  Reports by family since recent back surgery limited ambulation and sedentary mostly staying in bed.  Required assist for bathing and dressing.  She presented on 06/25/2019 with increasing low back pain with radiation and weakness in left leg.  Admission chemistries hemoglobin  9.7, WBC 12,700, BUN 70, creatinine 2.83, sodium 133, SARS coronavirus negative, ESR was 136, lactic acid 0.9.  CT renal study negative for hydronephrosis or ureteral stone.  Blood cultures 1/4 BC with GPC likely contaminant.  MRI lumbar spine showed postoperative changes from recent extension of posterior decompression and fusion L2-3 level.  Persistent severe stenosis at the level of L2-3 with question circumferential epidural collection.  Approximate 5 x 4 point centimeter by 7.4 cm heterogeneous collection within the posterior paraspinous soft tissues extending from L3-L5-S1.  Follow-up neurosurgery Dr. Kathyrn Sheriff in regards to latest back surgery with no current plans for surgical intervention.  She was started on Decadron.  Renal function continues to improve with gentle IV fluids latest creatinine 1.25.  Hospital course further complicated by steroid-induced hyperglycemia.  Pain managed with the use of MS Contin.  Latest hemoglobin of 9.1, WBC improved 9400. Marland Kitchen   Patient transferred to CIR on 06/28/2019 .    Patient currently requires mod with basic self-care skills and IADL secondary to muscle weakness, decreased cardiorespiratoy endurance and decreased sitting balance, decreased standing balance and decreased postural control.  Prior to hospitalization, patient could complete adl  with min.  Patient will benefit from skilled intervention to decrease level of assist with basic self-care skills, increase independence with basic self-care skills and increase level of independence with iADL prior to discharge home with care partner.  Anticipate patient will require intermittent supervision and follow up home health.  OT - End of Session Activity Tolerance: Tolerates 30+ min activity with multiple rests Endurance Deficit: Yes Endurance Deficit Description: fatigue and pain/spasm limiting participation in adl tasks OT Assessment Rehab Potential (ACUTE ONLY): Good OT Patient demonstrates impairments in the following area(s): Balance;Endurance;Motor;Pain OT Basic ADL's Functional Problem(s): Bathing;Dressing;Toileting OT Advanced ADL's Functional Problem(s): Simple Meal Preparation;Light Housekeeping OT Transfers Functional Problem(s): Toilet;Tub/Shower OT Plan OT Intensity: Minimum of 1-2 x/day, 45 to 90 minutes OT Frequency: 5 out of 7 days OT Duration/Estimated Length of Stay: 1 week OT Treatment/Interventions: Balance/vestibular training;Self Care/advanced ADL retraining;Therapeutic Exercise;DME/adaptive equipment instruction;Pain management;UE/LE Strength taining/ROM;Patient/family education;Discharge planning;Functional mobility training;Therapeutic Activities OT Self Feeding Anticipated Outcome(s): independent OT Basic Self-Care Anticipated Outcome(s): mod I OT Toileting Anticipated Outcome(s): supervision OT Bathroom Transfers Anticipated Outcome(s): supervision OT Recommendation Patient destination: Home Follow Up Recommendations: Home health  OT Equipment Details: owns tub bench, shower seat and commode   Skilled Therapeutic Intervention Patient in bed, alert and ready for therapy session.  She states that pain in moderate and she is awaiting medication - provided by nursing early in session.  Evaluation completed as documented below.  She presents with LB weakness, pain and balance deficits that limit her ability to complete self care, iadl and mobility tasks safely and independently at this time.  Reviewed role of OT, plan of care, use of assistive devices/DME, goals for therapy and back safety.  She participated in therapeutic adl and activity to include light mobility/stretching activities, rolling in bed with CS using bed rails, supine to sitting with min A.  Sit to stand and ambulation in room with RW CGA.  Toilet transfer with CGA.  toileting requires assistance for clothing management and hygiene - reviewed options to increase independence.  Grooming tasks completed at sink with set up assist.  She declined shower this session due to fatigue and pain.  Reviewed energy conservation and activity to maintain endurance and promote increased mobility.  She returned to bed at close of session due to limited ability to tolerate sitting position.  Min A for sitting to side lying position.  Bed alarm set and call bell/tray table in reach.    OT Evaluation Precautions/Restrictions  Precautions Precautions: Back;Fall Precaution Comments: verbally reviewed back precautions.  Restrictions Weight Bearing Restrictions: No General   Vital Signs Therapy Vitals Temp: 98.3 F (36.8 C) Temp Source: Oral Pulse Rate: 64 Resp: 18 BP: (!) 126/49 Patient Position (if appropriate): Lying Oxygen Therapy SpO2: 92 % O2 Device: Room Air Pain Pain Assessment Pain Scale: 0-10 Pain Score: 8  Pain Type: Acute pain Pain Location: Back Pain Orientation: Lower Pain Descriptors / Indicators: Spasm Pain Onset: Sudden Pain Intervention(s):  Repositioned Home Living/Prior Functioning Home Living Available Help at Discharge: Family, Available 24 hours/day Type of Home: House Home Access: Stairs to enter Technical brewer of Steps: 3 Entrance Stairs-Rails: None Home Layout: Two level, Bed/bath upstairs Alternate Level Stairs-Number of Steps: 14 Alternate Level Stairs-Rails: Can reach both Bathroom Shower/Tub: Gaffer, Chiropodist: Handicapped height Bathroom Accessibility: Yes Additional Comments: patient owns tub bench but was standing in the shower prior to this admission with assistance of daughter  Lives With: Spouse Prior Function Level of Independence: Other (comment), Requires assistive device for independence, Independent with gait, Independent with transfers, Needs assistance with ADLs  Able to Take Stairs?: Yes Driving: Yes Vocation: Retired Leisure: Hobbies-yes (Comment) Comments: reports wanting to enjoy playing with her 5y.o. granddaughter and walk outside ADL ADL Eating: Set up Grooming: Setup Where Assessed-Grooming: Sitting at sink Upper Body Bathing: Setup Where Assessed-Upper Body Bathing: Sitting at sink Lower Body Bathing: Maximal assistance Where Assessed-Lower Body Bathing: Sitting at sink Upper Body Dressing: Minimal assistance Where Assessed-Upper Body Dressing: Wheelchair Lower Body Dressing: Maximal assistance Where Assessed-Lower Body Dressing: Wheelchair Toileting: Moderate assistance Where Assessed-Toileting: Glass blower/designer: Therapist, music Method: Counselling psychologist: Grab bars ADL Comments: declined shower Vision Baseline Vision/History: Wears glasses(hx of cataract left eye and scheduled for fu surgery due to ongoing impairment) Wears Glasses: Reading only Patient Visual Report: No change from baseline Vision Assessment?: No apparent visual deficits Perception  Perception: Within Functional  Limits Praxis Praxis: Intact Cognition Overall Cognitive Status: Within Functional Limits for tasks assessed Arousal/Alertness: Awake/alert Orientation Level: Person;Place;Situation Person: Oriented Place: Oriented Situation: Oriented Year: 2021 Month: March Day of Week: Correct Memory: Appears intact Immediate Memory Recall: Sock;Blue;Bed Memory Recall Sock: Without Cue Memory Recall Blue: Without Cue Memory Recall Bed: With Cue Attention: Focused;Sustained Focused Attention: Appears intact Sustained Attention: Appears intact Awareness: Appears intact Problem Solving: Appears intact Safety/Judgment: Appears intact Sensation Sensation Additional Comments: UB grossly intact, patient notes history of burning, numbness and tingling bilateral hands that has improved Coordination Fine Motor Movements are Fluid and Coordinated: Yes Motor  Motor Motor - Skilled Clinical Observations: weak lower extremities  L>R Mobility  Bed Mobility Bed Mobility: Rolling Right;Right Sidelying to Sit;Sit to Sidelying Right;Rolling Left Rolling Right: Independent with assistive device Rolling Left: Independent with assistive device Right Sidelying to Sit: Minimal Assistance - Patient > 75% Sit to Sidelying Right: Minimal Assistance - Patient > 75% Transfers Sit to Stand: Contact Guard/Touching assist     Balance Balance Balance Assessed: Yes Static Sitting Balance Static Sitting - Level of Assistance: 6: Modified independent (Device/Increase time) Dynamic Sitting Balance Dynamic Sitting - Level of Assistance: 5: Stand by assistance Static Standing Balance Static Standing - Level of Assistance: 5: Stand by assistance Dynamic Standing Balance Dynamic Standing - Level of Assistance: 4: Min assist Extremity/Trunk Assessment RUE Assessment RUE Assessment: Within  Functional Limits General Strength Comments: grossly 4/5, no resistance due to lower back pain LUE Assessment LUE Assessment:  Within Functional Limits General Strength Comments: grossly 4/5, no resistance due to lower back pain     Refer to Care Plan for Long Term Goals  Recommendations for other services: None    Discharge Criteria: Patient will be discharged from OT if patient refuses treatment 3 consecutive times without medical reason, if treatment goals not met, if there is a change in medical status, if patient makes no progress towards goals or if patient is discharged from hospital.  The above assessment, treatment plan, treatment alternatives and goals were discussed and mutually agreed upon: by patient  [name] Levering 06/29/2019, 12:50 PM

## 2019-06-29 NOTE — Evaluation (Signed)
Physical Therapy Assessment and Plan  Patient Details  Name: Brandi Stephens MRN: [id number] Date of Birth: 06-17-1945  PT Diagnosis: Abnormality of gait, Difficulty walking, Impaired sensation, Low back pain, Muscle spasms and Muscle weakness Rehab Potential: Good ELOS: 7-10 days   Today's Date: 06/29/2019 PT Individual Time: 0802-0910 PT Individual Time Calculation (min): 68 min    Problem List:  Patient Active Problem List   Diagnosis Date Noted  . Lumbar radiculopathy 06/28/2019  . Spinal stenosis of lumbar region 06/28/2019  . Acute on chronic anemia   . Tremors of nervous system   . AKI (acute kidney injury) ([place])   . Chronic pain syndrome   . Acute renal failure superimposed on stage 3 chronic kidney disease ([place]) 06/25/2019  . Acute osteomyelitis of lumbar spine ([place]) 06/25/2019  . Weakness of left lower extremity   . Abdominal pain 06/24/2019  . Nausea 06/24/2019  . Abnormal urine odor 06/24/2019  . History of sepsis 06/24/2019  . Recent major surgery 06/24/2019  . History of diverticulosis 06/24/2019  . Elevated PTHrP level 05/09/2019  . Paresthesia 02/12/2019  . Low back pain 02/12/2019  . Neuropathy 12/20/2018  . Lumbar spinal stenosis 06/29/2018  . Preoperative clearance 06/04/2018  . Abnormal CT of the chest 06/04/2018  . Smoker 05/17/2018  . Hypothyroidism 11/30/2017  . Thoracic aortic atherosclerosis ([place]) 09/14/2016  . Benign essential tremor 09/09/2016  . RLS (restless legs syndrome) 09/09/2016  . H/O adenomatous polyp of colon 07/06/2016  . PVC's (premature ventricular contractions) 03/29/2016  . Preoperative cardiovascular examination 03/29/2016  . Grade I diastolic dysfunction 41/93/7902  . Palpitations 01/11/2016  . Shortness of breath 01/11/2016  . Former cigarette smoker 01/11/2016  . Leg edema 01/11/2016  . Chronic cough 10/28/2015  . 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 Malignant neoplasm of upper-inner quadrant of left breast in  female, estrogen receptor positive ([place]) 07/14/2015  . Prediabetes 07/08/2015  . CKD (chronic kidney disease), stage III 07/08/2015  . Dependent edema 06/08/2015  . Sleep disturbance 06/08/2015  . Mixed hyperlipidemia 06/08/2015  . Generalized anxiety disorder 06/08/2015  . Essential hypertension 06/08/2015  . Emphysema lung ([place]) 06/08/2015  . Gout 06/08/2015  . Macrocytic anemia   . 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 Sepsis ([place]) 05/15/2015  . Lumbar spondylosis 03/20/2015    Past Medical History:  Past Medical History:  Diagnosis Date  . AIN (anal intraepithelial neoplasia) anal canal   . Anemia    with pregnancy  . Anxiety   . Arthritis    knees, lower back, knees  . Chronic constipation   . CKD (chronic kidney disease)   . Coarse tremors    Essential  . COPD (chronic obstructive pulmonary disease) ([place])    2017 chest CT, history of  . Degenerative lumbar spinal stenosis   . Depression   . Drug dependency ([place])   . Elevated PTHrP level 05/09/2019  . GERD (gastroesophageal reflux disease)    pt denies  . Gout   . Grade I diastolic dysfunction 40/97/3532   Noted ECHO  . Hemorrhoids   . History of adenomatous polyp of colon   . History of basal cell carcinoma (BCC) excision 2015   nose  . History of sepsis 05/14/2014   post lumbar surgery  . History of shingles    x2  . Hyperlipidemia   . Hypertension   . Hypothyroidism   . Irregular heart beat    history of while undergoing radiation  . Malignant neoplasm of upper-inner  quadrant of left breast in female, estrogen receptor positive [facility]) oncologist-  dr Jana Hakim--  per lov in epic no recurrence   dx 07-13-2015  Left breast invasive ductal carcinoma, Stage IA, Grade 1 (TXN0),  09-16-2015  s/p  left breast lumpectomy with sln bx's,  completed radiation 11-18-2015,  started antiestrogen therapy 12-14-2015  . Neuropathy    Left foot and back of left leg  . Personal history of radiation therapy    completed  11-18-2015  left breast  . Pneumonia   . PONV (postoperative nausea and vomiting)   . Prediabetes    diet controlled, no med  . Restless leg syndrome   . Seasonal allergies   . Seizure ([place]) 05/2015   due to sepsis, only one time episode  . Tremor   . Wears partial dentures    lower    Past Surgical History:  Past Surgical History:  Procedure Laterality Date  . ABDOMINAL HYSTERECTOMY  1985  . BASAL CELL CARCINOMA EXCISION  10/15   nose  . BREAST BIOPSY Right 08/24/2015  . BREAST BIOPSY Right 07/13/2015  . BREAST BIOPSY Left 07/13/2015  . BREAST EXCISIONAL BIOPSY Left 1972  . BREAST LUMPECTOMY Left   . BREAST LUMPECTOMY WITH RADIOACTIVE SEED AND SENTINEL LYMPH NODE BIOPSY Left 09/16/2015   Procedure: BREAST LUMPECTOMY WITH RADIOACTIVE SEED AND SENTINEL LYMPH NODE BIOPSY;  Surgeon: Stark Klein, MD;  Location: [facility];  Service: General;  Laterality: Left;  . BREAST REDUCTION SURGERY Bilateral 1998  . CATARACT EXTRACTION W/ INTRAOCULAR LENS IMPLANT Left 2016  . [place]   hallo  . COLONOSCOPY  01/2013   polyps  . EYE SURGERY Left 2016  . HAND SURGERY Left 02-19-2002   dr Fredna Dow  '@[email or web address]'    repair collateral ligament/ MPJ left thumb  . [place]  2008  . HIGH RESOLUTION ANOSCOPY N/A 02/28/2018   Procedure: HIGH RESOLUTION ANOSCOPY WITH BIOPSY;  Surgeon: Leighton Ruff, MD;  Location: [facility];  Service: General;  Laterality: N/A;  . KNEE ARTHROSCOPY Right 2002  . LUMBAR SPINE SURGERY  03-20-2015   dr Kathyrn Sheriff   fusion L4-5, L5-S1  . MOUTH SURGERY  07/14/2018   2 infected teeth removed  . MULTIPLE TOOTH EXTRACTIONS     with bone grafting lower bottom right and left  . REDUCTION MAMMAPLASTY Bilateral   . RIGHT COLECTOMY  09-10-2003   dr Margot Chimes '@[email or web address]'    multiple colon polyps (per path tubular adenoma's, hyperplastic , benign appendix, benign two lymph nodes  . ROTATOR CUFF REPAIR Left 04/2016  . TONSILLECTOMY  1969   . TUBAL LIGATION Bilateral 1982    Assessment & Plan Clinical Impression: Patient is a 74 y.o. year old right-handed female with history significant for obesity with BMI 38.67, hypertension, , history of breast cancer, invasive ductal carcinoma grade 1 followed by Dr. Jana Hakim, hypothyroidism, COPD and quit smoking approximately 13 months ago, tremors as well as restless leg syndrome, chronic constipation, chronic kidney disease stage III, lumbar spinal stenosis post recent decompression and fusion 05/30/2019 per Dr. Kathyrn Sheriff discharge to home supervision level 200 feet with rolling walker.  History taken from chart review and patient.  Patient lives with spouse.  Two-level home 2 steps to entry.  Reports by family since recent back surgery limited ambulation and sedentary mostly staying in bed.  Required assist for bathing and dressing.  She presented on 06/25/2019 with increasing low back pain with radiation and weakness in left leg.  Admission chemistries hemoglobin 9.7, WBC 12,700, BUN 70, creatinine 2.83, sodium 133, SARS coronavirus negative, ESR was 136, lactic acid 0.9.  CT renal study negative for hydronephrosis or ureteral stone.  Blood cultures 1/4 BC with GPC likely contaminant.  MRI lumbar spine showed postoperative changes from recent extension of posterior decompression and fusion L2-3 level.  Persistent severe stenosis at the level of L2-3 with question circumferential epidural collection.  Approximate 5 x 4 point centimeter by 7.4 cm heterogeneous collection within the posterior paraspinous soft tissues extending from L3-L5-S1.  Follow-up neurosurgery Dr. Kathyrn Sheriff in regards to latest back surgery with no current plans for surgical intervention.  She was started on Decadron.  Renal function continues to improve with gentle IV fluids latest creatinine 1.25.  Hospital course further complicated by steroid-induced hyperglycemia.  Pain managed with the use of MS Contin.  Latest hemoglobin of 9.1,  WBC improved 9400.  Therapy evaluations completed and patient was admitted for a comprehensive rehab program. Patient transferred to CIR on 06/28/2019 .   Patient currently requires min with mobility secondary to muscle weakness, decreased cardiorespiratoy endurance and decreased standing balance and decreased balance strategies.  Prior to hospitalization, patient was supervision with mobility and lived with Spouse in a House home.  Home access is 3Stairs to enter.  Patient will benefit from skilled PT intervention to maximize safe functional mobility, minimize fall Stephens and decrease caregiver burden for planned discharge home with 24 hour supervision.  Anticipate patient will benefit from follow up [place] at discharge.  PT - End of Session Activity Tolerance: Tolerates 30+ min activity with multiple rests Endurance Deficit: Yes Endurance Deficit Description: fatigue and pain/spasm limiting participation in tasks PT Assessment Rehab Potential (ACUTE/IP ONLY): Good PT Barriers to Discharge: Inaccessible home environment;Home environment access/layout PT Patient demonstrates impairments in the following area(s): Balance;Perception;Safety;Edema;Behavior;Sensory;Endurance;Skin Integrity;Motor;Nutrition;Pain PT Transfers Functional Problem(s): Bed Mobility;Bed to Chair;Car;Furniture PT Locomotion Functional Problem(s): Ambulation;Stairs PT Plan PT Intensity: Minimum of 1-2 x/day ,45 to 90 minutes PT Frequency: 5 out of 7 days PT Duration Estimated Length of Stay: 7-10 days PT Treatment/Interventions: Ambulation/gait training;Community reintegration;DME/adaptive equipment instruction;Neuromuscular re-education;Psychosocial support;Stair training;UE/LE Strength taining/ROM;Balance/vestibular training;Discharge planning;Functional electrical stimulation;Pain management;Skin care/wound management;Therapeutic Activities;UE/LE Coordination activities;Disease management/prevention;Cognitive  remediation/compensation;Functional mobility training;Patient/family education;Splinting/orthotics;Therapeutic Exercise;Visual/perceptual remediation/compensation PT Transfers Anticipated Outcome(s): supervision using LRAD PT Locomotion Anticipated Outcome(s): supervision using LRAD PT Recommendation Recommendations for Other Services: Neuropsych consult;Therapeutic Recreation consult Therapeutic Recreation Interventions: Stress management Follow Up Recommendations: Home health PT;24 hour supervision/assistance Patient destination: Home Equipment Recommended: To be determined  Skilled Therapeutic Intervention Evaluation completed (see details above and below) with education on PT POC and goals and individual treatment initiated with focus on activity tolerance, bed mobility, transfers, gait training, stair navigation, and pt education regarding daily therapy schedule, weekly team meetings, purpose of PT evaluation, and other CIR information. Pt received supine in bed and agreeable to therapy session. Reports that from Friday 3/5 until readmission to hospital on Tuesday 3/9 she had 3 falls at home due to L LE buckling. MD in/out for morning assessment. RN in/out for medication administration. Pt reports she has a hx of B UE tremors - present throughout session. Pt also reports that her son, [name], passed away 9years ago and since then she has had sudden onset of anxiety attacks limiting her participation in community activities. Pt noted to have increased anxiety throughout session as well as onset of muscle spasms with pain during mobility requiring frequent calming cuing for breathing and relaxation to assist with pain management. Therapist  reinforced education regarding spine precautions - pt able to recall easily. Rolling R/L in bed with supervision relying heavily on bedrails and having significant difficulty with onset of muscle spasm and pain. Supine>sitting R EOB relying heavily on bedrails via  logroll technique with pt demonstrating significant increase in pain during transition from sidelying>sitting - requires cuing for breathing and relaxation. Sit>stand elevated EOB>RW with min assist for lifting/balance. Stand pivot to w/c using RW with CGA/min assist for balance.  Transported to/from gym in w/c. Gait training 243f using RW with CGA/min assist - demonstrates guarded movements due to the pain with heavy reliance on B UE support throughout RW as well as decreased gait speed. Ascended/descended 8 steps using B HRs with reciprocal pattern on ascent and step-to pattern on descent - on descent pt initially self-selected stepping down 1st with L LE but then suddenly attempted to step down with R LE 1st causing L knee to buckle and required mod assist to maintain upright. Transported back to room in w/c and pt requesting to return to bed and rest until next therapy session. Stand pivot w/c>EOB using RW with CGA/min assist. Sit>supine via reverse logroll with mod cuing for sequencing - mod assist for B LE management into the bed. Therapist assisted with placing pillows to support pt in partial L sidelying for pain management and left with needs in reach and bed alarm on.  PT Evaluation Precautions/Restrictions Precautions Precautions: Back;Fall Restrictions Weight Bearing Restrictions: No Pain Pain Assessment Pain Scale: 0-10 Pain Score: 7  Pain Type: Acute pain Pain Location: Back Pain Orientation: Medial;Mid;Lower Pain Descriptors / Indicators: Aching;Spasm Pain Frequency: Intermittent Pain Onset: On-going Pain Intervention(s): Medication (See eMAR);Emotional support;Rest;Relaxation;RN made aware;Ambulation/increased activity Home Living/Prior Functioning Home Living Available Help at Discharge: Family;Available 24 hours/day Type of Home: House Home Access: Stairs to enter [employer] Steps: 3 Entrance Stairs-Rails: None(reports holding onto door handle) Home Layout:  Two level;Bed/bath upstairs Alternate Level Stairs-Number of Steps: 14(landing after ~3steps) Alternate Level Stairs-Rails: Can reach both  Lives With: Spouse(husband "Bob") Prior Function Level of Independence: Other (comment);Requires assistive device for independence;Independent with gait;Independent with transfers(since surgery in Feb 2021 has been using RW for ambulation but requiring increased assist due to progressive L LE weakness)  Able to Take Stairs?: Yes Driving: Yes Vocation: Retired Leisure: Hobbies-yes (Comment) Comments: reports wanting to enjoy playing with her 5y.o. granddaughter and walk outside Perception  Perception Perception: Within Functional Limits Praxis Praxis: Intact  Cognition Overall Cognitive Status: Within Functional Limits for tasks assessed Arousal/Alertness: Awake/alert Orientation Level: Oriented X4 Attention: Focused;Sustained Focused Attention: Appears intact Sustained Attention: Appears intact Memory: Appears intact Awareness: Appears intact Problem Solving: Appears intact Safety/Judgment: Appears intact Sensation Sensation Light Touch: Impaired Detail Peripheral sensation comments: decreased L LE light touch compared to R Light Touch Impaired Details: Impaired LLE Hot/Cold: Not tested Proprioception: Appears Intact Stereognosis: Not tested Coordination Gross Motor Movements are Fluid and Coordinated: No Coordination and Movement Description: gross motor impaired due to pain with guarded movement and L LE weaknesss Motor  Motor Motor: Other (comment) Motor - Skilled Clinical Observations: generalized deconditioning with L LE weakness  Mobility Bed Mobility Bed Mobility: Rolling Right;Rolling Left;Right Sidelying to Sit;Sit to Sidelying Right Rolling Right: Supervision/verbal cueing Rolling Left: Supervision/Verbal cueing Right Sidelying to Sit: Contact Guard/Touching assist;Minimal Assistance - Patient > 75% Sit to Sidelying  Right: Moderate Assistance - Patient 50-74% Transfers Transfers: Sit to Stand;Stand to Sit;Stand Pivot Transfers Sit to Stand: Contact Guard/Touching assist;Minimal Assistance - Patient > 75%  Stand to Sit: Contact Guard/Touching assist;Minimal Assistance - Patient > 75% Stand Pivot Transfers: Contact Guard/Touching assist;Minimal Assistance - Patient > 75% Stand Pivot Transfer Details: Verbal cues for precautions/safety;Verbal cues for safe use of DME/AE;Verbal cues for technique Transfer (Assistive device): Rolling walker Locomotion  Gait Ambulation: Yes Gait Assistance: Contact Guard/Touching assist;Minimal Assistance - Patient > 75% Gait Distance (Feet): 280 Feet Assistive device: Rolling walker Gait Assistance Details: Verbal cues for sequencing;Verbal cues for technique;Verbal cues for safe use of DME/AE Gait Gait: Yes Gait Pattern: Impaired Gait Pattern: (significant reliance on B UE support through RW) Gait velocity: decreased Stairs / Additional Locomotion Stairs: Yes Stairs Assistance: Minimal Assistance - Patient > 75%;Moderate Assistance - Patient 50 - 74% Stair Management Technique: Two rails Number of Stairs: 8 Height of Stairs: 6 Ramp: Minimal Assistance - Patient >75% Wheelchair Mobility Wheelchair Mobility: No  Trunk/Postural Assessment  Cervical Assessment Cervical Assessment: Exceptions to WFL(forward head) Thoracic Assessment Thoracic Assessment: Within Functional Limits Lumbar Assessment Lumbar Assessment: Exceptions to WFL(guarded/restricted movement due to surgeries) Postural Control Postural Control: Deficits on evaluation Postural Limitations: relies on RW for UE support during standing  Balance Balance Balance Assessed: Yes Static Sitting Balance Static Sitting - Balance Support: Feet supported Static Sitting - Level of Assistance: 5: Stand by assistance Dynamic Sitting Balance Dynamic Sitting - Level of Assistance: 5: Stand by assistance Static  Standing Balance Static Standing - Balance Support: Bilateral upper extremity supported Static Standing - Level of Assistance: 5: Stand by assistance(CGA) Dynamic Standing Balance Dynamic Standing - Balance Support: During functional activity;Bilateral upper extremity supported Dynamic Standing - Level of Assistance: 4: Min assist Extremity Assessment      RLE Assessment RLE Assessment: Exceptions to [facility] RLE Strength Right Hip Flexion: 3+/5 Right Knee Flexion: 4+/5 Right Knee Extension: 4+/5 Right Ankle Dorsiflexion: 4+/5 Right Ankle Plantar Flexion: 4+/5 LLE Assessment LLE Assessment: Exceptions to [facility] LLE Strength Left Hip Flexion: 2-/5 Left Knee Flexion: 2/5 Left Knee Extension: 2/5 Left Ankle Dorsiflexion: 4/5 Left Ankle Plantar Flexion: 3+/5    Refer to Care Plan for Long Term Goals  Recommendations for other services: Neuropsych and Therapeutic Recreation  Stress management  Discharge Criteria: Patient will be discharged from PT if patient refuses treatment 3 consecutive times without medical reason, if treatment goals not met, if there is a change in medical status, if patient makes no progress towards goals or if patient is discharged from hospital.  The above assessment, treatment plan, treatment alternatives and goals were discussed and mutually agreed upon: by patient  Tawana Scale, PT, DPT 06/29/2019, 7:50 AM

## 2019-06-29 NOTE — Progress Notes (Signed)
Physical Therapy Session Note  Patient Details  Name: Brandi Stephens MRN: [id number] Date of Birth: 13-Nov-1945  Today's Date: 06/29/2019 PT Individual Time: [id number] PT Individual Time Calculation (min): 54 min   Short Term Goals: Week 1:  PT Short Term Goal 1 (Week 1): = to LTGs based on ELOS  Skilled Therapeutic Interventions/Progress Updates:    Pt received supine in bed reporting she has not had a good day due to worsening "kidney" pain with pt noted to have ice pack on anterior surface of L hip/pelvic region. Despite this pt agreeable to therapy session and requesting to use bathroom. Donned shoes supine in bed with max assist. Supine>sit R EOB via logroll technique with heavy use of bedrails and CGA for trunk steadying - pt continues to have significant increase in pain during the transition from sidelying>sitting with therapist providing cuing for breathing and relaxation techniques. Sit>stand elevated EOB>RW with CGA for steadying. Gait ~72ft in/out of bathroom using RW with CGA for steadying. Pt able to perform LB clothing management in standing with CGA/close supervision for safety. Sit<>stand on/off toilet using grab bars for support with CGA. Continent of bladder and performed seated peri-care without assist. Standing hand hygiene and oral care at sink with pt requiring forearm support on counter to maintain static standing for that long of a bout with CGA for safety. Transported to/from gym in w/c. Ambulatory simulated car transfer (sedan height as car hydraulics not functioning though pt has SUV) using RW with CGA for steadying and min assist for L LE management in/out of car. Ambulated ~47ft up/down ramp using RW with CGA for steadying. Ambulated 181ft x2 (seated break between) using RW with CGA for steadying/safety and 1x minor L knee buckle requiring min assist for safety. Pt reporting increasing "kidney" pain and appears very uncomfortable - pt states "I want to work hard but my pain  is getting worse." Transported back to room in w/c and pt's daughter, [name], arriving. Stand pivot w/c>EOB using RW with CGA for steadying. Sit>supine with mod assist for B LE management into the bed and min cuing for reverse logroll technique. Pt appears very uncomfortable with R LE shaking due to the increased pain. Therapist provided new ice to place on anterior L hip/pelvic region for the "kidney" pain. Pt left supine in bed with her daughter present, needs in reach, and bed alarm on.  Therapy Documentation Precautions:  Precautions Precautions: Back, Fall Precaution Comments: verbally reviewed back precautions.  Restrictions Weight Bearing Restrictions: No  Pain: Pt does not rate pain throughout session but reports it is significant and increases with activity - pt noticeably in pain - provided seated rest breaks, emotional support, and ice at end of session for pain management. Pt aware of when next medications are due.    Therapy/Group: Individual Therapy  Tawana Scale, PT, DPT 06/29/2019, 3:50 PM

## 2019-06-29 NOTE — Progress Notes (Signed)
[place] PHYSICAL MEDICINE & REHABILITATION PROGRESS NOTE   Subjective/Complaints:  Pt reports pain subsiding some- was 8/10 this AM and making it hard ot breathe- is now down to 6/10- poor sleep- due to having spasms- Also having painful area that feels like ovarian or kidney issues- denies UTI-  Robaxin helping leg spasms somewhat.   Spasms are still her biggest issue.    ROS:  Pt denies SOB, abd pain, CP, N/V/C/D, and vision changes  Objective:   No results found. No results for input(s): WBC, HGB, HCT, PLT in the last 72 hours. Recent Labs    06/27/19 0319 06/27/19 0319 06/27/19 1117 06/28/19 0146  NA 135  --   --  135  K 5.5*   < > 4.9 5.1  CL 105  --   --  100  CO2 20*  --   --  23  GLUCOSE 250*  --   --  227*  BUN 39*  --   --  41*  CREATININE 1.18*  --   --  1.25*  CALCIUM 9.8  --   --  9.7   < > = values in this interval not displayed.    Intake/Output Summary (Last 24 hours) at 06/29/2019 1401 Last data filed at 06/28/2019 2100 Gross per 24 hour  Intake 240 ml  Output -  Net 240 ml     Physical Exam: Vital Signs Blood pressure (!) 126/49, pulse 64, temperature 98.3 F (36.8 C), temperature source Oral, resp. rate 18, height 5\' 8"  (1.727 m), weight 114 kg, SpO2 92 %.  Vitals reviewed. Constitutional: pt awake, alert, sitting up in bed; very talkative; nursing at bedside, NAD HENT:  Head: conjugate gaze CV: RRR but close to borderline bradycardia  Respiratory: CTA B/L GI: soft, NT, ND; (+)BS hypoactive Musculoskeletal:     Comments: No edema or tenderness in extremities  Neurological: Ox3.   Cooperative with exam. Generalized tremor noted Motor: Bilateral upper extremities: 4-4+/5 proximal distal Bilateral lower extremities: 4-4+/5 proximal distal Sensation diminished to light touch distal extremities  Skin:  Back incision well healing from lumbar to sacrum- no drainage or erythema Psychiatric: talkative, appropriate   Assessment/Plan: 1.  Functional deficits secondary to acute on chronic back pain S/P recent L2-L3 fusion 05/30/2019. which require 3+ hours per day of interdisciplinary therapy in a comprehensive inpatient rehab setting.  Physiatrist is providing close team supervision and 24 hour management of active medical problems listed below.  Physiatrist and rehab team continue to assess barriers to discharge/monitor patient progress toward functional and medical goals  Care Tool:  Bathing  Bathing activity did not occur: Refused           Bathing assist       Upper Body Dressing/Undressing Upper body dressing   What is the patient wearing?: Bra, Pull over shirt    Upper body assist Assist Level: Moderate Assistance - Patient 50 - 74%    Lower Body Dressing/Undressing Lower body dressing      What is the patient wearing?: Underwear/pull up, Pants     Lower body assist Assist for lower body dressing: Moderate Assistance - Patient 50 - 74%     Toileting Toileting    Toileting assist Assist for toileting: Moderate Assistance - Patient 50 - 74%     Transfers Chair/bed transfer  Transfers assist     Chair/bed transfer assist level: Contact Guard/Touching assist     Locomotion Ambulation   Ambulation assist  Walk 10 feet activity   Assist           Walk 50 feet activity   Assist           Walk 150 feet activity   Assist           Walk 10 feet on uneven surface  activity   Assist           Wheelchair     Assist               Wheelchair 50 feet with 2 turns activity    Assist            Wheelchair 150 feet activity     Assist          Blood pressure (!) 126/49, pulse 64, temperature 98.3 F (36.8 C), temperature source Oral, resp. rate 18, height 5\' 8"  (1.727 m), weight 114 kg, SpO2 92 %.  Medical Problem List and Plan: 1.  Decreased functional mobility secondary to acute on chronic back pain S/P recent L2-L3  fusion 05/30/2019.  Decadron taper per neurosurgery             -patient may shower after coming incision             -ELOS/Goals: 8-13 days/supervision/min a             Admit to CIR 2.  Antithrombotics: -DVT/anticoagulation: SCDs.               Vascular study ordered             -antiplatelet therapy: N/A 3. Pain Management: Voltaren gel 4 times daily, Cymbalta 60 mg daily, Robaxin-750 mig 3 times daily, MS Contin 15 mg every 12 hours, oxycodone as needed for breakthrough pain  3/13- increase Robaxin up to 1000 mg TID for spasms since they are biggest issue             Monitor with increased exertion 4. Mood: Provide emotional support             -antipsychotic agents: N/A 5. Neuropsych: This patient is capable of making decisions on her own behalf. 6. Skin/Wound Care: Routine skin checks 7. Fluids/Electrolytes/Nutrition: Routine in and outs.  CMP ordered 8.  Acute on chronic anemia.  CBC ordered 9.  AKI on CKD stage III.  CMP ordered  3/13- Last Cr 1.25- up from 1.1- BUN 41- encourage fluids 10.  Hypertension.  Toprol-XL 12.5 mg daily.               Monitor with increased mobility 11.  Obesity.  BMI 38.67.  Dietary follow-up 12.  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  Follow-up outpatient Dr. Jana Hakim.  Continue Armidex 13.  Hyperlipidemia.  Lipitor 14.  History of gout.  Continue Zyloprim. 15.  Hypothyroidism.  TSH 1.420.  Continue Synthroid 16.  Chronic constipation.  Adjust bowel meds as necessary 17.  COPD/quit smoking 13 months ago.  Check oxygen saturations every shift 18.  Tremors with restless leg syndrome.  Continue Requip as well as Mysoline  19. Hyperkalemia  3/13- Last K+ 5.1- will recheck Monday.  LOS: 1 days A FACE TO FACE EVALUATION WAS PERFORMED  Brandi Stephens 06/29/2019, 2:01 PM

## 2019-06-30 ENCOUNTER — Inpatient Hospital Stay (HOSPITAL_COMMUNITY): Payer: PPO

## 2019-06-30 DIAGNOSIS — M7989 Other specified soft tissue disorders: Secondary | ICD-10-CM

## 2019-06-30 LAB — CULTURE, BLOOD (ROUTINE X 2)
Culture: NO GROWTH
Special Requests: ADEQUATE

## 2019-06-30 NOTE — Progress Notes (Signed)
Lower extremity venous has been completed.   Preliminary results in CV Proc.   Abram Sander 06/30/2019 10:10 AM

## 2019-06-30 NOTE — Progress Notes (Signed)
[place] PHYSICAL MEDICINE & REHABILITATION PROGRESS NOTE   Subjective/Complaints:  Pt reports increase in robaxin HELPFUL for spasms but not resolved.   Slept like a baby- very deeply- so wakes up "weird" when sleeps so deep. Very sedated initially.   Is somewhat constipated-  Kept asking about severe LLQ pain- anterior- near groin but in abdomen- kept thinking was kidney- explained pain for kidney in back usually.   Pt wondering what it is. Thought could also be ovarian, but is 73.    ROS:   Pt denies SOB, abd pain, CP, N/V/C/D, and vision changes   Objective:   VAS [place] LOWER EXTREMITY VENOUS (DVT)  Result Date: 06/30/2019  Lower Venous DVTStudy Indications: Swelling, and Edema.  Comparison Study: no prior Performing Technologist: Abram Sander RVS  Examination Guidelines: A complete evaluation includes B-mode imaging, spectral Doppler, color Doppler, and power Doppler as needed of all accessible portions of each vessel. Bilateral testing is considered an integral part of a complete examination. Limited examinations for reoccurring indications may be performed as noted. The reflux portion of the exam is performed with the patient in reverse Trendelenburg.  +---------+---------------+---------+-----------+----------+--------------+ RIGHT    CompressibilityPhasicitySpontaneityPropertiesThrombus Aging +---------+---------------+---------+-----------+----------+--------------+ CFV      Full           Yes      Yes                                 +---------+---------------+---------+-----------+----------+--------------+ SFJ      Full                                                        +---------+---------------+---------+-----------+----------+--------------+ FV Prox  Full                                                        +---------+---------------+---------+-----------+----------+--------------+ FV Mid   Full                                                         +---------+---------------+---------+-----------+----------+--------------+ FV DistalFull                                                        +---------+---------------+---------+-----------+----------+--------------+ PFV      Full                                                        +---------+---------------+---------+-----------+----------+--------------+ POP      Full           Yes      Yes                                 +---------+---------------+---------+-----------+----------+--------------+  PTV      Full                                                        +---------+---------------+---------+-----------+----------+--------------+ PERO     Full                                                        +---------+---------------+---------+-----------+----------+--------------+   +---------+---------------+---------+-----------+----------+--------------+ LEFT     CompressibilityPhasicitySpontaneityPropertiesThrombus Aging +---------+---------------+---------+-----------+----------+--------------+ CFV      Full           Yes      Yes                                 +---------+---------------+---------+-----------+----------+--------------+ SFJ      Full                                                        +---------+---------------+---------+-----------+----------+--------------+ FV Prox  Full                                                        +---------+---------------+---------+-----------+----------+--------------+ FV Mid   Full                                                        +---------+---------------+---------+-----------+----------+--------------+ FV DistalFull                                                        +---------+---------------+---------+-----------+----------+--------------+ PFV      Full                                                         +---------+---------------+---------+-----------+----------+--------------+ POP      Full           Yes      Yes                                 +---------+---------------+---------+-----------+----------+--------------+ PTV      Full                                                        +---------+---------------+---------+-----------+----------+--------------+  PERO     Full                                                        +---------+---------------+---------+-----------+----------+--------------+     Summary: BILATERAL: - No evidence of deep vein thrombosis seen in the lower extremities, bilaterally.   *See table(s) above for measurements and observations.    Preliminary    No results for input(s): WBC, HGB, HCT, PLT in the last 72 hours. Recent Labs    06/27/19 1117 06/28/19 0146  NA  --  135  K 4.9 5.1  CL  --  100  CO2  --  23  GLUCOSE  --  227*  BUN  --  41*  CREATININE  --  1.25*  CALCIUM  --  9.7    Intake/Output Summary (Last 24 hours) at 06/30/2019 1026 Last data filed at 06/30/2019 0857 Gross per 24 hour  Intake 750 ml  Output --  Net 750 ml     Physical Exam: Vital Signs Blood pressure 115/89, pulse 76, temperature 97.7 F (36.5 C), resp. rate 20, height 5\' 8"  (1.727 m), weight 114 kg, SpO2 97 %.  Vitals reviewed. Constitutional: pt woke up a little confused since sleeping so deeply- cleared in 1 minute, NAD HENT:  Head: conjugate gaze CV: RRR; no JVD  Respiratory: CTA B/L- good air movement GI: soft, NT, ND, (+)BS- TTP over LLQ near groin but still in abdomen- no rebound- nothing palpated per se.  Musculoskeletal:     Comments: No edema or tenderness in extremities  Neurological: Ox3 Cooperative with exam. Generalized tremor noted Motor: Bilateral upper extremities: 4-4+/5 proximal distal Bilateral lower extremities: 4-4+/5 proximal distal Sensation diminished to light touch distal extremities  Skin:  Back incision well healing from  lumbar to sacrum- no drainage or erythema Psychiatric: talkative; appropriate.    Assessment/Plan: 1. Functional deficits secondary to acute on chronic back pain S/P recent L2-L3 fusion 05/30/2019. which require 3+ hours per day of interdisciplinary therapy in a comprehensive inpatient rehab setting.  Physiatrist is providing close team supervision and 24 hour management of active medical problems listed below.  Physiatrist and rehab team continue to assess barriers to discharge/monitor patient progress toward functional and medical goals  Care Tool:  Bathing  Bathing activity did not occur: Refused           Bathing assist       Upper Body Dressing/Undressing Upper body dressing Upper body dressing/undressing activity did not occur (including orthotics): N/A What is the patient wearing?: Pull over shirt, Bra    Upper body assist Assist Level: Moderate Assistance - Patient 50 - 74%    Lower Body Dressing/Undressing Lower body dressing      What is the patient wearing?: Underwear/pull up, Pants     Lower body assist Assist for lower body dressing: Moderate Assistance - Patient 50 - 74%     Toileting Toileting    Toileting assist Assist for toileting: Minimal Assistance - Patient > 75%     Transfers Chair/bed transfer  Transfers assist     Chair/bed transfer assist level: Minimal Assistance - Patient > 75%     Locomotion Ambulation   Ambulation assist      Assist level: Minimal Assistance - Patient > 75% Assistive device: Walker-rolling Max distance: 228ft  Walk 10 feet activity   Assist     Assist level: Minimal Assistance - Patient > 75% Assistive device: Walker-rolling   Walk 50 feet activity   Assist    Assist level: Minimal Assistance - Patient > 75% Assistive device: Walker-rolling    Walk 150 feet activity   Assist    Assist level: Minimal Assistance - Patient > 75% Assistive device: Walker-rolling    Walk 10 feet on  uneven surface  activity   Assist     Assist level: Minimal Assistance - Patient > 75% Assistive device: Aeronautical engineer Will patient use wheelchair at discharge?: No             Wheelchair 50 feet with 2 turns activity    Assist            Wheelchair 150 feet activity     Assist          Blood pressure 115/89, pulse 76, temperature 97.7 F (36.5 C), resp. rate 20, height 5\' 8"  (1.727 m), weight 114 kg, SpO2 97 %.  Medical Problem List and Plan: 1.  Decreased functional mobility secondary to acute on chronic back pain S/P recent L2-L3 fusion 05/30/2019.  Decadron taper per neurosurgery             -patient may shower after coming incision             -ELOS/Goals: 8-13 days/supervision/min a             Admit to CIR 2.  Antithrombotics: -DVT/anticoagulation: SCDs.               Vascular study ordered             -antiplatelet therapy: N/A 3. Pain Management: Voltaren gel 4 times daily, Cymbalta 60 mg daily, Robaxin-750 mig 3 times daily, MS Contin 15 mg every 12 hours, oxycodone as needed for breakthrough pain  3/13- increase Robaxin up to 1000 mg TID for spasms since they are biggest issue  3/14- improved muscle spasms ,but somewhat better- con't meds             Monitor with increased exertion 4. Mood: Provide emotional support             -antipsychotic agents: N/A 5. Neuropsych: This patient is capable of making decisions on her own behalf. 6. Skin/Wound Care: Routine skin checks 7. Fluids/Electrolytes/Nutrition: Routine in and outs.  CMP ordered 8.  Acute on chronic anemia.  CBC ordered 9.  AKI on CKD stage III.  CMP ordered  3/13- Last Cr 1.25- up from 1.1- BUN 41- encourage fluids  3/14- pt admits to drinking ~ 1000cc/day- explains need to drink 2L/day minimim- said never had kidney issues 10.  Hypertension.  Toprol-XL 12.5 mg daily.               Monitor with increased mobility  3/14- BP well controlled- con't  regimen.  11.  Obesity.  BMI 38.67.  Dietary follow-up 12.  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  Follow-up outpatient Dr. Jana Hakim.  Continue Armidex 13.  Hyperlipidemia.  Lipitor 14.  History of gout.  Continue Zyloprim. 15.  Hypothyroidism.  TSH 1.420.  Continue Synthroid 16.  Chronic constipation.  Adjust bowel meds as necessary  3/14- on miralax BID- see if can have BM- could be cause of LLQ pain.  17.  COPD/quit smoking 13 months ago.  Check oxygen saturations every shift  18.  Tremors with restless leg syndrome.  Continue Requip as well as Mysoline  19. Hyperkalemia  3/13- Last K+ 5.1- will recheck Monday.  20. LLQ pain- is near groin, but still in abdomen- biggest source of pain except back spasms. -If not improved with BM, suggest U/S or CT.   LOS: 2 days A FACE TO FACE EVALUATION WAS PERFORMED  Gustavo Meditz 06/30/2019, 10:26 AM

## 2019-07-01 ENCOUNTER — Inpatient Hospital Stay (HOSPITAL_COMMUNITY): Payer: PPO | Admitting: Occupational Therapy

## 2019-07-01 ENCOUNTER — Inpatient Hospital Stay (HOSPITAL_COMMUNITY): Payer: PPO | Admitting: Physical Therapy

## 2019-07-01 ENCOUNTER — Inpatient Hospital Stay (HOSPITAL_COMMUNITY): Payer: PPO

## 2019-07-01 LAB — COMPREHENSIVE METABOLIC PANEL
ALT: 27 U/L (ref 0–44)
AST: 17 U/L (ref 15–41)
Albumin: 2.8 g/dL — ABNORMAL LOW (ref 3.5–5.0)
Alkaline Phosphatase: 92 U/L (ref 38–126)
Anion gap: 12 (ref 5–15)
BUN: 26 mg/dL — ABNORMAL HIGH (ref 8–23)
CO2: 25 mmol/L (ref 22–32)
Calcium: 9.8 mg/dL (ref 8.9–10.3)
Chloride: 101 mmol/L (ref 98–111)
Creatinine, Ser: 1.05 mg/dL — ABNORMAL HIGH (ref 0.44–1.00)
GFR calc Af Amer: >60 mL/min (ref >60)
GFR calc non Af Amer: 53 mL/min — ABNORMAL LOW (ref >60)
Glucose, Bld: 177 mg/dL — ABNORMAL HIGH (ref 70–99)
Potassium: 4.6 mmol/L (ref 3.5–5.1)
Sodium: 138 mmol/L (ref 135–145)
Total Bilirubin: 0.6 mg/dL (ref 0.3–1.2)
Total Protein: 6 g/dL — ABNORMAL LOW (ref 6.5–8.1)

## 2019-07-01 LAB — CBC WITH DIFFERENTIAL/PLATELET
Abs Immature Granulocytes: 0.5 10*3/uL — ABNORMAL HIGH (ref 0.00–0.07)
Basophils Absolute: 0 10*3/uL (ref 0.0–0.1)
Basophils Relative: 0 %
Eosinophils Absolute: 0.3 10*3/uL (ref 0.0–0.5)
Eosinophils Relative: 3 %
HCT: 32.5 % — ABNORMAL LOW (ref 36.0–46.0)
Hemoglobin: 10.2 g/dL — ABNORMAL LOW (ref 12.0–15.0)
Lymphocytes Relative: 20 %
Lymphs Abs: 2.2 10*3/uL (ref 0.7–4.0)
MCH: 31.4 pg (ref 26.0–34.0)
MCHC: 31.4 g/dL (ref 30.0–36.0)
MCV: 100 fL (ref 80.0–100.0)
Monocytes Absolute: 0.7 10*3/uL (ref 0.1–1.0)
Monocytes Relative: 6 %
Myelocytes: 5 %
Neutro Abs: 7.2 10*3/uL (ref 1.7–7.7)
Neutrophils Relative %: 66 %
Platelets: 444 10*3/uL — ABNORMAL HIGH (ref 150–400)
RBC: 3.25 MIL/uL — ABNORMAL LOW (ref 3.87–5.11)
RDW: 14.1 % (ref 11.5–15.5)
WBC: 10.9 10*3/uL — ABNORMAL HIGH (ref 4.0–10.5)
nRBC: 0 % (ref 0.0–0.2)
nRBC: 1 /100 WBC — ABNORMAL HIGH

## 2019-07-01 MED ORDER — DOCUSATE SODIUM 100 MG PO CAPS
100.0000 mg | ORAL_CAPSULE | Freq: Three times a day (TID) | ORAL | Status: DC
  Administered 2019-07-01 – 2019-07-11 (×29): 100 mg via ORAL
  Filled 2019-07-01 (×30): qty 1

## 2019-07-01 MED ORDER — TIZANIDINE HCL 2 MG PO TABS
4.0000 mg | ORAL_TABLET | Freq: Four times a day (QID) | ORAL | Status: DC | PRN
  Administered 2019-07-01 – 2019-07-04 (×6): 4 mg via ORAL
  Filled 2019-07-01 (×6): qty 2

## 2019-07-01 NOTE — Progress Notes (Signed)
Occupational Therapy Session Note  Patient Details  Name: Brandi Stephens MRN: [id number] Date of Birth: [birth year]  Today's Date: 07/01/2019 OT Individual Time: 6270-3500 OT Individual Time Calculation (min): 55 min  and Today's Date: 07/01/2019 OT Missed Time: 15 Minutes Missed Time Reason: Nursing care   Short Term Goals: Week 1:  OT Short Term Goal 1 (Week 1): STG = LTG  Skilled Therapeutic Interventions/Progress Updates:    Pt received in bed finishing conversation with the case manager and receiving medications from her RN so she missed the first 15 min of therapy.    Pt agreeable to therapy but stating her back spasms were very severe at this time.  Everytime she tried to adjust in bed by having the [facility] set slightly higher or lower, her back began to spasm. Pt could only tolerate being at a 30 degree angle.   From this position, worked on [place] with orange theraband with B arm horizontal abduction, tricep extension, and single arm lat pull downs.    Pt continued to have spasms, adjusted in bed with A from NT for +2 to slide her up carefully for improved positioning.    Pt requested ice pack. Provided pt with ice pack and set her up with all needs met.  Bed alarm set.   Therapy Documentation Precautions:  Precautions Precautions: Back, Fall Precaution Comments: verbally reviewed back precautions.  Restrictions Weight Bearing Restrictions: No    Vital Signs: Therapy Vitals Temp: 97.8 F (36.6 C) Temp Source: Oral Pulse Rate: 86 Resp: 18 BP: (!) 140/94 Patient Position (if appropriate): Lying Oxygen Therapy SpO2: 98 % O2 Device: Room Air Pain: Pain Assessment Pain Scale: 0-10 Pain Score: 8  Pain Type: Acute pain Pain Location: Back Pain Orientation: Lower Pain Radiating Towards: LLQ abdomen Pain Descriptors / Indicators: Aching;Spasm Pain Frequency: Intermittent Pain Onset: On-going Pain Intervention(s): Medication (See eMAR) ADL: ADL Eating:  Set up Grooming: Setup Where Assessed-Grooming: Sitting at sink Upper Body Bathing: Setup Where Assessed-Upper Body Bathing: Sitting at sink Lower Body Bathing: Maximal assistance Where Assessed-Lower Body Bathing: Sitting at sink Upper Body Dressing: Minimal assistance Where Assessed-Upper Body Dressing: Wheelchair Lower Body Dressing: Maximal assistance Where Assessed-Lower Body Dressing: Wheelchair Toileting: Moderate assistance Where Assessed-Toileting: Glass blower/designer: Therapist, music Method: Counselling psychologist: Grab bars ADL Comments: declined shower   Therapy/Group: Individual Therapy  [place] 07/01/2019, 8:33 AM

## 2019-07-01 NOTE — Progress Notes (Signed)
  NEUROSURGERY PROGRESS NOTE   Social visit.  Rehab day #4 Reports continued muscle spasms, but feels as though she is overall improving with mobility. Incision remains without signs of infection. Reassurance provided.  Appreciate CIR excellent care.  No charge visit.

## 2019-07-01 NOTE — Progress Notes (Addendum)
[facility] PHYSICAL MEDICINE & REHABILITATION PROGRESS NOTE   Subjective/Complaints: Continues to have severe spasms this morning.  Has left lower abdominal and paraspinal pain, sharp. Has not had BM recently.  K+ 4.6 today.  Glucose 177  ROS:   Pt denies SOB, CP, and vision changes   Objective:   VAS [place] LOWER EXTREMITY VENOUS (DVT)  Result Date: 06/30/2019  Lower Venous DVTStudy Indications: Swelling, and Edema.  Comparison Study: no prior Performing Technologist: Abram Sander RVS  Examination Guidelines: A complete evaluation includes B-mode imaging, spectral Doppler, color Doppler, and power Doppler as needed of all accessible portions of each vessel. Bilateral testing is considered an integral part of a complete examination. Limited examinations for reoccurring indications may be performed as noted. The reflux portion of the exam is performed with the patient in reverse Trendelenburg.  +---------+---------------+---------+-----------+----------+--------------+ RIGHT    CompressibilityPhasicitySpontaneityPropertiesThrombus Aging +---------+---------------+---------+-----------+----------+--------------+ CFV      Full           Yes      Yes                                 +---------+---------------+---------+-----------+----------+--------------+ SFJ      Full                                                        +---------+---------------+---------+-----------+----------+--------------+ FV Prox  Full                                                        +---------+---------------+---------+-----------+----------+--------------+ FV Mid   Full                                                        +---------+---------------+---------+-----------+----------+--------------+ FV DistalFull                                                        +---------+---------------+---------+-----------+----------+--------------+ PFV      Full                                                         +---------+---------------+---------+-----------+----------+--------------+ POP      Full           Yes      Yes                                 +---------+---------------+---------+-----------+----------+--------------+ PTV      Full                                                        +---------+---------------+---------+-----------+----------+--------------+  PERO     Full                                                        +---------+---------------+---------+-----------+----------+--------------+   +---------+---------------+---------+-----------+----------+--------------+ LEFT     CompressibilityPhasicitySpontaneityPropertiesThrombus Aging +---------+---------------+---------+-----------+----------+--------------+ CFV      Full           Yes      Yes                                 +---------+---------------+---------+-----------+----------+--------------+ SFJ      Full                                                        +---------+---------------+---------+-----------+----------+--------------+ FV Prox  Full                                                        +---------+---------------+---------+-----------+----------+--------------+ FV Mid   Full                                                        +---------+---------------+---------+-----------+----------+--------------+ FV DistalFull                                                        +---------+---------------+---------+-----------+----------+--------------+ PFV      Full                                                        +---------+---------------+---------+-----------+----------+--------------+ POP      Full           Yes      Yes                                 +---------+---------------+---------+-----------+----------+--------------+ PTV      Full                                                         +---------+---------------+---------+-----------+----------+--------------+ PERO     Full                                                        +---------+---------------+---------+-----------+----------+--------------+  Summary: BILATERAL: - No evidence of deep vein thrombosis seen in the lower extremities, bilaterally.   *See table(s) above for measurements and observations. Electronically signed by Harold Barban MD on 06/30/2019 at 4:19:52 PM.    Final    Recent Labs    07/01/19 0508  WBC 10.9*  HGB 10.2*  HCT 32.5*  PLT 444*   Recent Labs    07/01/19 0508  NA 138  K 4.6  CL 101  CO2 25  GLUCOSE 177*  BUN 26*  CREATININE 1.05*  CALCIUM 9.8    Intake/Output Summary (Last 24 hours) at 07/01/2019 1106 Last data filed at 07/01/2019 0603 Gross per 24 hour  Intake 1200 ml  Output --  Net 1200 ml     Physical Exam: Vital Signs Blood pressure (!) 140/94, pulse 86, temperature 97.8 F (36.6 C), temperature source Oral, resp. rate 18, height 5\' 8"  (1.727 m), weight 114 kg, SpO2 98 %.  Vitals reviewed. Constitutional: In severe spasm this morning.  HENT:  Head: conjugate gaze CV: RRR; no JVD  Respiratory: CTA B/L- good air movement GI: soft, NT, ND, (+)BS- TTP over LLQ near groin but still in abdomen- no rebound- nothing palpated per se.  Musculoskeletal:     Comments: No edema or tenderness in extremities  Neurological: Ox3 Cooperative with exam. Generalized tremor noted Motor: Bilateral upper extremities: 4-4+/5 proximal distal Bilateral lower extremities: 4-4+/5 proximal distal Sensation diminished to light touch distal extremities  Skin:  Back incision well healing from lumbar to sacrum- no drainage or erythema Psychiatric: talkative; appropriate.    Assessment/Plan: 1. Functional deficits secondary to acute on chronic back pain S/P recent L2-L3 fusion 05/30/2019. which require 3+ hours per day of interdisciplinary therapy in a comprehensive inpatient rehab  setting.  Physiatrist is providing close team supervision and 24 hour management of active medical problems listed below.  Physiatrist and rehab team continue to assess barriers to discharge/monitor patient progress toward functional and medical goals  Care Tool:  Bathing  Bathing activity did not occur: Refused Body parts bathed by patient: Right arm, Left arm, Chest, Abdomen, Face     Body parts n/a: Front perineal area, Buttocks, Right upper leg, Left upper leg, Right lower leg   Bathing assist Assist Level: Set up assist     Upper Body Dressing/Undressing Upper body dressing Upper body dressing/undressing activity did not occur (including orthotics): N/A What is the patient wearing?: Pull over shirt    Upper body assist Assist Level: Minimal Assistance - Patient > 75%    Lower Body Dressing/Undressing Lower body dressing      What is the patient wearing?: Pants, Underwear/pull up     Lower body assist Assist for lower body dressing: Moderate Assistance - Patient 50 - 74%     Toileting Toileting    Toileting assist Assist for toileting: Maximal Assistance - Patient 25 - 49% Assistive Device Comment: (walker)   Transfers Chair/bed transfer  Transfers assist     Chair/bed transfer assist level: Minimal Assistance - Patient > 75%     Locomotion Ambulation   Ambulation assist      Assist level: Minimal Assistance - Patient > 75% Assistive device: Walker-rolling Max distance: 217ft   Walk 10 feet activity   Assist     Assist level: Minimal Assistance - Patient > 75% Assistive device: Walker-rolling   Walk 50 feet activity   Assist    Assist level: Minimal Assistance - Patient > 75% Assistive device: Walker-rolling  Walk 150 feet activity   Assist    Assist level: Minimal Assistance - Patient > 75% Assistive device: Walker-rolling    Walk 10 feet on uneven surface  activity   Assist     Assist level: Minimal Assistance -  Patient > 75% Assistive device: Aeronautical engineer Will patient use wheelchair at discharge?: No             Wheelchair 50 feet with 2 turns activity    Assist            Wheelchair 150 feet activity     Assist          Blood pressure (!) 140/94, pulse 86, temperature 97.8 F (36.6 C), temperature source Oral, resp. rate 18, height 5\' 8"  (1.727 m), weight 114 kg, SpO2 98 %.  Medical Problem List and Plan: 1.  Decreased functional mobility secondary to spinal stenosis S/P recent L2-L3 fusion 05/30/2019.  Decadron taper per neurosurgery             -patient may shower after covering incision             -ELOS/Goals: 8-13 days/supervision/min a            -Continue CIR therapies 2.  Antithrombotics: -DVT/anticoagulation: SCDs.               Vascular study ordered             -antiplatelet therapy: N/A 3. Pain Management: Voltaren gel 4 times daily, Cymbalta 60 mg daily, Robaxin-750 mig 3 times daily, MS Contin 15 mg every 12 hours, oxycodone as needed for breakthrough pain  3/13- increase Robaxin up to 1000 mg TID for spasms since they are biggest issue  3/14- improved muscle spasms ,but somewhat better- con't meds  3/15: Added Tizanidine 4mg  q6H prn for severe spasms. Asked nurse Ed to give one dose this morning.              Monitor with increased exertion 4. Mood: Provide emotional support             -antipsychotic agents: N/A 5. Neuropsych: This patient is capable of making decisions on her own behalf. 6. Skin/Wound Care: Routine skin checks 7. Fluids/Electrolytes/Nutrition: Routine in and outs.  CMP ordered 8.  Acute on chronic anemia.  CBC ordered 9.  AKI on CKD stage III.  CMP ordered  3/13- Last Cr 1.25- up from 1.1- BUN 41- encourage fluids  3/14- pt admits to drinking ~ 1000cc/day- explains need to drink 2L/day minimim- said never had kidney issues 10.  Hypertension.  Toprol-XL 12.5 mg daily.               Monitor with  increased mobility  3/14- BP well controlled- con't regimen.  11.  Obesity.  BMI 38.67.  Dietary follow-up 12.  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  Follow-up outpatient Dr. Jana Hakim.  Continue Armidex 13.  Hyperlipidemia.  Lipitor 14.  History of gout.  Continue Zyloprim. 15.  Hypothyroidism.  TSH 1.420.  Continue Synthroid 16.  Chronic constipation.  Adjust bowel meds as necessary  3/14- on miralax BID- see if can have BM- could be cause of LLQ pain.   3/15: Increase Colace to TID. Refuses enema or mag citrate at this time.  17.  COPD/quit smoking 13 months ago.  Check oxygen saturations every shift 18.  Tremors with restless leg syndrome.  Continue Requip as well as  Mysoline  19. Hyperkalemia  3/13- Last K+ 5.1- will recheck Monday.   3/15: stable at 4.6 20. LLQ pain- is near groin, but still in abdomen- biggest source of pain except back spasms. -If not improved with BM, suggest U/S or CT.   3/15: KUB to assess for kidney stone vs. constipation  LOS: 3 days A FACE TO FACE EVALUATION WAS PERFORMED  Syair Fricker P Maribel Hadley 07/01/2019, 11:06 AM

## 2019-07-01 NOTE — Progress Notes (Signed)
Occupational Therapy Session Note  Patient Details  Name: Brandi Stephens MRN: [id number] Date of Birth: 24-Jul-1945  Today's Date: 07/01/2019 OT Individual Time: [id number] OT Individual Time Calculation (min): 57 min   Short Term Goals: Week 1:  OT Short Term Goal 1 (Week 1): STG = LTG  Skilled Therapeutic Interventions/Progress Updates:    Pt greeted in bed, reported she had a rough night but premedicated for spasm pain. Supine<sit completed with increased time due to spasms, Min A with vcs for logroll technique. While EOB pt visibly shaking from pain, sit<stand with CGA using RW and pt had to sit back down due to pain/spasms. On 2nd attempt she completed stand pivot<w/c with steady assist using RW. Pt then engaged in UB bathing/dressing tasks w/c level at the sink. Provided pillow to lower back to improve support with pt still visibly uncomfortable, shifting weight often due to spasms/pain. After donning her shirt, pt requested to use the restroom. Ambulatory transfer to toilet completed with RW and steady assist. Pt able to lower clothing herself however needed A for hygiene and Mod A for pulling up underwear and pants afterwards. She then returned to EOB, requesting to change pants bedlevel. Pt able to tolerate slow interval changes with HOB positioning when set up for log rolling. Once again, pt needed Mod A to fully elevate pants. Near end of session pt with increased nausea and she vomited in a basin. RN made aware and provided her with antinausea medicine during tx. At end of session pt remained comfortably in bed with all needs within reach and bed alarm set.  Pt very motivated to do her therapy throughout session, just very limited functionally by pain   Therapy Documentation Precautions:  Precautions Precautions: Back, Fall Precaution Comments: verbally reviewed back precautions.  Restrictions Weight Bearing Restrictions: No Pain:   ADL: ADL Eating: Set up Grooming:  Setup Where Assessed-Grooming: Sitting at sink Upper Body Bathing: Setup Where Assessed-Upper Body Bathing: Sitting at sink Lower Body Bathing: Maximal assistance Where Assessed-Lower Body Bathing: Sitting at sink Upper Body Dressing: Minimal assistance Where Assessed-Upper Body Dressing: Wheelchair Lower Body Dressing: Maximal assistance Where Assessed-Lower Body Dressing: Wheelchair Toileting: Moderate assistance Where Assessed-Toileting: Glass blower/designer: Therapist, music Method: Counselling psychologist: Grab bars ADL Comments: declined shower      Therapy/Group: Individual Therapy  Virtie Bungert A Taji Sather 07/01/2019, 12:22 PM

## 2019-07-01 NOTE — Progress Notes (Signed)
Physical Therapy Session Note  Patient Details  Name: Brandi Stephens MRN: [id number] Date of Birth: 12/18/1945  Today's Date: 07/01/2019 PT Individual Time: 1325-1355 PT Individual Time Calculation (min): 30 min  PT Missed Time: 15 min Missed Time Reason: unavailable (BP issues)  Short Term Goals: Week 1:  PT Short Term Goal 1 (Week 1): = to LTGs based on ELOS  Skilled Therapeutic Interventions/Progress Updates:    Pt received seated in bed at originally scheduled therapy time. Per pt and her daughter pt with low BP this PM and waiting for RN to return to take manual BP. This therapist returned at a later time in PM to attempt to see patient. When therapist returns pt reports that low BP due to medications and not cause for concern at this time. Pt agreeable to therapy session and requesting to ambulate. Supine to sit with min A with heavy reliance on bedrail and increased time needed due to onset of pain and spasms in low back, subsides with rest break. Sit to stand with Supervision to RW. Ambulation into bathroom with RW and CGA. Pt is able to perform toilet transfer with Supervision, needs assist for pericare. Ambulation 2 x 150 ft with RW at [facility] level with seated rest break between bouts of ambulation. Pt requests to return to bed at end of session. Sit to supine mod A for BLE management. Placed kpad under back for pain management. Pt left semi-reclined in bed with needs in reach at end of session. Pt missed 15 min of scheduled therapy session due to concerns about low BP earlier this PM.  Therapy Documentation Precautions:  Precautions Precautions: Back, Fall Precaution Comments: verbally reviewed back precautions.  Restrictions Weight Bearing Restrictions: No    Therapy/Group: Individual Therapy   Excell Seltzer, PT, DPT  07/01/2019, 4:01 PM

## 2019-07-01 NOTE — Care Management (Signed)
Patient Details  Name: Brandi Stephens MRN: [id number] Date of Birth: 04-18-1946  Today's Date: 07/01/2019  Problem List:  Patient Active Problem List   Diagnosis Date Noted  . Lumbar radiculopathy 06/28/2019  . Spinal stenosis of lumbar region 06/28/2019  . Acute on chronic anemia   . Tremors of nervous system   . AKI (acute kidney injury) ([place])   . Chronic pain syndrome   . Acute renal failure superimposed on stage 3 chronic kidney disease ([place]) 06/25/2019  . Acute osteomyelitis of lumbar spine ([place]) 06/25/2019  . Weakness of left lower extremity   . Abdominal pain 06/24/2019  . Nausea 06/24/2019  . Abnormal urine odor 06/24/2019  . History of sepsis 06/24/2019  . Recent major surgery 06/24/2019  . History of diverticulosis 06/24/2019  . Elevated PTHrP level 05/09/2019  . Paresthesia 02/12/2019  . Low back pain 02/12/2019  . Neuropathy 12/20/2018  . Lumbar spinal stenosis 06/29/2018  . Preoperative clearance 06/04/2018  . Abnormal CT of the chest 06/04/2018  . Smoker 05/17/2018  . Hypothyroidism 11/30/2017  . Thoracic aortic atherosclerosis ([place]) 09/14/2016  . Benign essential tremor 09/09/2016  . RLS (restless legs syndrome) 09/09/2016  . H/O adenomatous polyp of colon 07/06/2016  . PVC's (premature ventricular contractions) 03/29/2016  . Preoperative cardiovascular examination 03/29/2016  . Grade I diastolic dysfunction 0000000  . Palpitations 01/11/2016  . Shortness of breath 01/11/2016  . Former cigarette smoker 01/11/2016  . Leg edema 01/11/2016  . Chronic cough 10/28/2015  . 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 Prediabetes 07/08/2015  . CKD (chronic kidney disease), stage III 07/08/2015  . Dependent edema 06/08/2015  . Sleep disturbance 06/08/2015  . Mixed hyperlipidemia 06/08/2015  . Generalized anxiety disorder 06/08/2015  .  Essential hypertension 06/08/2015  . Emphysema lung ([place]) 06/08/2015  . Gout 06/08/2015  . Macrocytic anemia   . 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 Sepsis ([place]) 05/15/2015  . Lumbar spondylosis 03/20/2015   Past Medical History:  Past Medical History:  Diagnosis Date  . AIN (anal intraepithelial neoplasia) anal canal   . Anemia    with pregnancy  . Anxiety   . Arthritis    knees, lower back, knees  . Chronic constipation   . CKD (chronic kidney disease)   . Coarse tremors    Essential  . COPD (chronic obstructive pulmonary disease) ([place])    2017 chest CT, history of  . Degenerative lumbar spinal stenosis   . Depression   . Drug dependency ([place])   . Elevated PTHrP level 05/09/2019  . GERD (gastroesophageal reflux disease)    pt denies  . Gout   . Grade I diastolic dysfunction 0000000   Noted ECHO  . Hemorrhoids   . History of adenomatous polyp of colon   . History of basal cell carcinoma (BCC) excision 2015   nose  . History of sepsis 05/14/2014   post lumbar surgery  . History of shingles    x2  . Hyperlipidemia   . Hypertension   . Hypothyroidism   . Irregular heart beat    history of while undergoing radiation  . Malignant neoplasm of upper-inner quadrant of left breast in female, estrogen receptor positive [facility]) oncologist-  dr Jana Hakim--  per lov in epic no recurrence   dx 07-13-2015  Left breast invasive ductal carcinoma, Stage IA, Grade 1 (TXN0),  09-16-2015  s/p  left breast lumpectomy  with sln bx's,  completed radiation 11-18-2015,  started antiestrogen therapy 12-14-2015  . Neuropathy    Left foot and back of left leg  . Personal history of radiation therapy    completed 11-18-2015  left breast  . Pneumonia   . PONV (postoperative nausea and vomiting)   . Prediabetes    diet controlled, no med  . Restless leg syndrome   . Seasonal allergies   . Seizure ([place]) 05/2015   due to sepsis, only one time episode  . Tremor   . Wears partial dentures     lower    Past Surgical History:  Past Surgical History:  Procedure Laterality Date  . ABDOMINAL HYSTERECTOMY  1985  . BASAL CELL CARCINOMA EXCISION  10/15   nose  . BREAST BIOPSY Right 08/24/2015  . BREAST BIOPSY Right 07/13/2015  . BREAST BIOPSY Left 07/13/2015  . BREAST EXCISIONAL BIOPSY Left 1972  . BREAST LUMPECTOMY Left   . BREAST LUMPECTOMY WITH RADIOACTIVE SEED AND SENTINEL LYMPH NODE BIOPSY Left 09/16/2015   Procedure: BREAST LUMPECTOMY WITH RADIOACTIVE SEED AND SENTINEL LYMPH NODE BIOPSY;  Surgeon: Stark Klein, MD;  Location: [place];  Service: General;  Laterality: Left;  . BREAST REDUCTION SURGERY Bilateral 1998  . CATARACT EXTRACTION W/ INTRAOCULAR LENS IMPLANT Left 2016  . [place]   hallo  . COLONOSCOPY  01/2013   polyps  . EYE SURGERY Left 2016  . HAND SURGERY Left 02-19-2002   dr Fredna Dow  @[facility]    repair collateral ligament/ MPJ left thumb  . [place]  2008  . HIGH RESOLUTION ANOSCOPY N/A 02/28/2018   Procedure: HIGH RESOLUTION ANOSCOPY WITH BIOPSY;  Surgeon: Leighton Ruff, MD;  Location[facility];  Service: General;  Laterality: N/A;  . KNEE ARTHROSCOPY Right 2002  . LUMBAR SPINE SURGERY  03-20-2015   dr Kathyrn Sheriff   fusion L4-5, L5-S1  . MOUTH SURGERY  07/14/2018   2 infected teeth removed  . MULTIPLE TOOTH EXTRACTIONS     with bone grafting lower bottom right and left  . REDUCTION MAMMAPLASTY Bilateral   . RIGHT COLECTOMY  09-10-2003   dr Margot Chimes @[facility]    multiple colon polyps (per path tubular adenoma's, hyperplastic , benign appendix, benign two lymph nodes  . ROTATOR CUFF REPAIR Left 04/2016  . TONSILLECTOMY  1969  . TUBAL LIGATION Bilateral 1982   Social History:  reports that she quit smoking about 13 months ago. Her smoking use included cigarettes. She has a 17.50 pack-year smoking history. She has never used smokeless tobacco. She reports current alcohol use. She reports that she does not  use drugs.  Family / Support Systems Marital Status: Married Patient Roles: Spouse, Parent Spouse/Significant Other: Robert Children: Odis Hollingshead Anticipated Caregiver: [name] (dtr) and Herbie Baltimore (spouse) Ability/Limitations of Caregiver: sup mobility, min adls Caregiver Availability: 24/7  Social History Preferred language: English Religion: Episcopalian Read: Yes Write: Yes   Abuse/Neglect Abuse/Neglect Assessment Can Be Completed: Yes Physical Abuse: Denies Verbal Abuse: Denies Sexual Abuse: Denies Exploitation of patient/patient's resources: Denies Self-Neglect: Denies  Emotional Status Pt's affect, behavior and adjustment status: Normal behavior, mood and affect Recent Psychosocial Issues: Insomnia Psychiatric History: Anxiety disorder/depression, sleep disturbance disorder Substance Abuse History: Vodka shot daily and use of CBD (2020)  Patient / Family Perceptions, Expectations & Goals Pt/Family understanding of illness & functional limitations: Patient has a good understanding of current health and functional limitations Premorbid pt/family roles/activities: Required assistance for B+D, sedentary since recent back  surgery Anticipated changes in roles/activities/participation: Will require assistance with B+D at discharge and help to manage home Pt/family expectations/goals: Patient would like to get back to pre-admission functional status and be able to get up/down stairs to [employer]: None Premorbid Home Care/DME Agencies: Other (Comment) Transportation available at discharge: Daughter or spouse will provide transportation at discharge  Discharge Planning Living Arrangements: Spouse/significant other, Children Support Systems: Spouse/significant other, Children Type of Residence: Private residence Insurance Resources: Multimedia programmer (specify)([employer]) Money Management: Patient, Spouse Does the patient have  any problems obtaining your medications?: No Home Management: Family will manage the home, cooking, Archivist Preliminary Plans: Discharge home with spouse and daughter assisting as needed Sw Barriers to Discharge: Home environment access/layout Sw Barriers to Discharge Comments: B+B upstairs (14 steps) Social Work Anticipated Follow Up Needs: HH/OP Expected length of stay: 7-10 days  Clinical Impression Personable patient, experiencing pain and muscle spasms as we were talking. Nurse able to address pain medications before OT session. Patient reports this has been a long 5 year process and multiple surgeries and bouts with sepsis. Hopeful that she is on the road to recovery and does not want to be rushed to discharge, wants to take time needed to assure she is better and can function at home with her spouse and daughter's assistance. She is eager to get better and back to her life without pain.  Dorien Chihuahua B 07/01/2019, 10:20 AM

## 2019-07-01 NOTE — IPOC Note (Signed)
Overall Plan of Care [facility]) Patient Details Name: Brandi Stephens MRN: [id number] DOB: 05/03/1945  Admitting Diagnosis: Spinal stenosis of lumbar region  Hospital Problems: Principal Problem:   Spinal stenosis of lumbar region Active Problems:   Lumbar radiculopathy     Functional Problem List: Nursing Pain, Edema  PT Balance, Perception, Safety, Edema, Behavior, Sensory, Endurance, Skin Integrity, Motor, Nutrition, Pain  OT Balance, Endurance, Motor, Pain  SLP    TR         Basic ADL's: OT Bathing, Dressing, Toileting     Advanced  ADL's: OT Simple Meal Preparation, Light Housekeeping     Transfers: PT Bed Mobility, Bed to Chair, Car, Manufacturing systems engineer, Metallurgist: PT Ambulation, Stairs     Additional Impairments: OT    SLP        TR      Anticipated Outcomes Item Anticipated Outcome  Self Feeding independent  Swallowing      Basic self-care  mod I  Toileting  supervision   Bathroom Transfers supervision  Bowel/Bladder  to be continent x 2  Transfers  supervision using LRAD  Locomotion  supervision using LRAD  Communication     Cognition     Pain  less than 2  Safety/Judgment  to remain fall free while in rehab   Therapy Plan: PT Intensity: Minimum of 1-2 x/day ,45 to 90 minutes PT Frequency: 5 out of 7 days PT Duration Estimated Length of Stay: 7-10 days OT Intensity: Minimum of 1-2 x/day, 45 to 90 minutes OT Frequency: 5 out of 7 days OT Duration/Estimated Length of Stay: 1 week     Due to the current state of emergency, patients may not be receiving their 3-hours of Medicare-mandated therapy.   Team Interventions: Nursing Interventions Patient/Family Education, Discharge Planning, Pain Management, Bowel Management  PT interventions Ambulation/gait training, Community reintegration, DME/adaptive equipment instruction, Neuromuscular re-education, Psychosocial support, Stair training, UE/LE Strength taining/ROM,  Training and development officer, Discharge planning, Functional electrical stimulation, Pain management, Skin care/wound management, Therapeutic Activities, UE/LE Coordination activities, Disease management/prevention, Cognitive remediation/compensation, Functional mobility training, Patient/family education, Splinting/orthotics, Therapeutic Exercise, Visual/perceptual remediation/compensation  OT Interventions Balance/vestibular training, Self Care/advanced ADL retraining, Therapeutic Exercise, DME/adaptive equipment instruction, Pain management, UE/LE Strength taining/ROM, Patient/family education, Discharge planning, Functional mobility training, Therapeutic Activities  SLP Interventions    TR Interventions    SW/CM Interventions Discharge Planning, Psychosocial Support, Disease Management/Prevention, Patient/Family Education   Barriers to Discharge MD  Medical stability  Nursing      PT Inaccessible home environment, Home environment access/layout    OT      SLP      SW Home environment access/layout B+B upstairs (14 steps)   Team Discharge Planning: Destination: PT-Home ,OT- Home , SLP-  Projected Follow-up: PT-Home health PT, 24 hour supervision/assistance, OT-  Home health OT, SLP-  Projected Equipment Needs: PT-To be determined, OT-  , SLP-  Equipment Details: PT- , OT-owns tub bench, shower seat and commode Patient/family involved in discharge planning: PT- Patient,  OT-Patient, SLP-   MD ELOS: 8-13 days Medical Rehab Prognosis:  Excellent Assessment: Brandi Stephens continues CIR for decreased functional mobility secondary to acute on chronic back pain s/p recent L2-L3 fusion 05/30/19. We have been titrating up her anti-spasmodics for better control of her severe spasms. She is also having abdominal pain secondary to constipation vs. Kidney stone; will assess with KUB today. BP and K+ are now well controlled. Increasing bowel regimen for better control of constipation.  See Team  Conference Notes for weekly updates to the plan of care

## 2019-07-01 NOTE — Progress Notes (Signed)
Inpatient Rehabilitation  Patient information reviewed and entered into eRehab system by Josefita Weissmann M. Jakya Dovidio, M.A., CCC/SLP, PPS Coordinator.  Information including medical coding, functional ability and quality indicators will be reviewed and updated through discharge.    

## 2019-07-01 NOTE — Care Management (Signed)
[facility] Individual Statement of Services  Patient Name:  Brandi Stephens  Date:  07/01/2019  Welcome to the [facility].  Our goal is to provide you with an individualized program based on your diagnosis and situation, designed to meet your specific needs.  With this comprehensive rehabilitation program, you will be expected to participate in at least 3 hours of rehabilitation therapies Monday-Friday, with modified therapy programming on the weekends.  Your rehabilitation program will include the following services:  Physical Therapy (PT), Occupational Therapy (OT), 24 hour per day rehabilitation nursing, Therapeutic Recreaction (TR), Neuropsychology, Case Management (Social Worker), Rehabilitation Medicine, Nutrition Services and Pharmacy Services  Weekly team conferences will be held on Wednesdays to discuss your progress.  Your Social Industrial/product designer will talk with you frequently to get your input and to update you on team discussions.  Team conferences with you and your family in attendance may also be held.  Expected length of stay: 7-10 days  Overall anticipated outcome: Supervision  Depending on your progress and recovery, your program may change. Your Social Industrial/product designer will coordinate services and will keep you informed of any changes. Your Social Worker's/Care Manager's name and contact numbers are listed  below.  The following services may also be recommended but are not provided by the [facility]:    [place] will be made to provide these services after discharge if needed.  Arrangements include referral to agencies that provide these services.  Your insurance has been verified to be:  [employer] Your primary doctor is:  Harland Dingwall, NP-C  Pertinent information will be shared with your or doctor and your  insurance company.  [place], RN [phone] Or Jerilynn Mages) [phone]  Social Worker:  Loralee Pacas, [place] or ([phone]  Information discussed with and copy given to patient by: Margarito Liner, 07/01/2019, 8:11 AM

## 2019-07-02 ENCOUNTER — Inpatient Hospital Stay (HOSPITAL_COMMUNITY): Payer: PPO | Admitting: Physical Therapy

## 2019-07-02 ENCOUNTER — Other Ambulatory Visit: Payer: Self-pay | Admitting: Family Medicine

## 2019-07-02 ENCOUNTER — Inpatient Hospital Stay (HOSPITAL_COMMUNITY): Payer: PPO | Admitting: Occupational Therapy

## 2019-07-02 MED ORDER — ALPRAZOLAM 0.25 MG PO TABS
0.2500 mg | ORAL_TABLET | Freq: Three times a day (TID) | ORAL | Status: DC | PRN
  Administered 2019-07-02 – 2019-07-06 (×3): 0.25 mg via ORAL
  Filled 2019-07-02 (×3): qty 1

## 2019-07-02 MED ORDER — SENNA 8.6 MG PO TABS
2.0000 | ORAL_TABLET | Freq: Every day | ORAL | Status: DC
  Administered 2019-07-02 – 2019-07-08 (×7): 17.2 mg via ORAL
  Filled 2019-07-02 (×7): qty 2

## 2019-07-02 MED ORDER — ALUM & MAG HYDROXIDE-SIMETH 200-200-20 MG/5ML PO SUSP: 15.0000 mL | Freq: Four times a day (QID) | ORAL | Status: DC | PRN

## 2019-07-02 MED ORDER — ALUM & MAG HYDROXIDE-SIMETH 200-200-20 MG/5 ML NICU TOPICAL
1.0000 "application " | TOPICAL | Status: DC | PRN
  Filled 2019-07-02: qty 355

## 2019-07-02 NOTE — Progress Notes (Addendum)
Pt using vape pen in room. Notified charge nurse. PA notified as well. Obtained vape pen, placed in chart

## 2019-07-02 NOTE — Progress Notes (Signed)
Pt with last bowel movement still on 3/12. Pt offered sorbitol this morning, states "yesterday when I took that stuff I threw up all over the place". Pt declined and requests a followup with a more effective intervention today, states "that miralax that you all give me is about half the dose that I usually take". Will follow up with oncoming shift for follow up.

## 2019-07-02 NOTE — Progress Notes (Signed)
Occupational Therapy Session Note  Patient Details  Name: Brandi Stephens MRN: [id number] Date of Birth: Sep 20, 1945  Today's Date: 07/02/2019 OT Individual Time: 5208-0223 OT Individual Time Calculation (min): 75 min    Short Term Goals: Week 1:  OT Short Term Goal 1 (Week 1): STG = LTG  Skilled Therapeutic Interventions/Progress Updates:    Pt seen for ADL training with a focus on functional mobility and adaptive techniques.  Pt continues to have back pain and spasms and was able to move more today.  See ADL documentation below.  Overall pt is able to complete mobility with CGA to S, UB self care with set up and LB self care with min -mod A.  Pt is aware of the initial recommendations for her to be here 1 week but she feels she will need a few days longer as the spasms prevented her from doing a lot of moving for a few days.  Pt resting in bed with all needs met. Bed alarm set.   Therapy Documentation Precautions:  Precautions Precautions: Back, Fall Precaution Comments: verbally reviewed back precautions.  Restrictions Weight Bearing Restrictions: No  Pain: Pain Assessment Pain Scale: 0-10 Pain Score: 8  Pain Type: Surgical pain Pain Location: Back Pain Orientation: Mid;Lower Pain Descriptors / Indicators: Aching;Spasm Pain Frequency: Constant Pain Onset: Progressive Pain Intervention(s): Repositioned ADL: ADL Eating: Set up Grooming: Setup Where Assessed-Grooming: Sitting at sink Upper Body Bathing: Setup Where Assessed-Upper Body Bathing: Shower Lower Body Bathing: Moderate assistance Where Assessed-Lower Body Bathing: Shower Upper Body Dressing: Setup Where Assessed-Upper Body Dressing: Wheelchair Lower Body Dressing: Moderate assistance Where Assessed-Lower Body Dressing: Wheelchair Toileting: Minimal assistance Where Assessed-Toileting: Glass blower/designer: Therapist, music Method: Counselling psychologist: Physiological scientist: Curator Method: Heritage manager: Radio broadcast assistant ADL Comments: declined shower   Therapy/Group: Individual Therapy  [place] 07/02/2019, 11:52 AM

## 2019-07-02 NOTE — Progress Notes (Signed)
Physical Therapy Session Note  Patient Details  Name: Brandi Stephens MRN: [id number] Date of Birth: [birth year]  Today's Date: 07/02/2019 PT Individual Time: [id number] PT Individual Time Calculation (min): 75 min   Short Term Goals: Week 1:  PT Short Term Goal 1 (Week 1): = to LTGs based on ELOS  Skilled Therapeutic Interventions/Progress Updates:     Pt received in supine. Verbalizes increase in low back and L hip/groin pain from sitting up in recliner earlier and RN in room medicating with muscle relaxer. Pain is spasming in nature. Numerical rating not provided. PT cues for pursed lip breathing technique to manage pain symptoms.   Supine>sit with verbal and tactile cues for logrolling technique and minA provided from R sidelying to upright sitting. Increased time required between all transitional movements for pain control. Sit>stand with supervision for hand placement and sequencing for safety. Pt ambulates x2 bouts of 150' with RW and extended seated rest break. Cues to decrease WB through RW for energy conservation.   Stair training. x4 steps with step-to gait pattern and use of bilateral hand rails. CGA provided for safety and verbal cues for correct sequencing. Following stair training pt performs alternating toe taps on bottom step, holding onto bilateral rails. Pt verbalizes significant increase in pain in L hip flexor region and able to complete <5 reps with LLE.   Ambulates 100' back to room. Performs toileting and pericare with supervision. Requires modA for management of BLEs with sit>supine.  In supine, PT performs PROM to BLEs. No pain provoked with RLE and pt demonstrates functional ROM. Slight increase in with PROM of LLE when brought into hip flexion ~90 degrees, complaining of pain in hip flexor region as well as across low back and radiating into R side, but pt does demonstrate functional PROM. Pt also able to perform isometric contraction of bilateral hip flexors without  pain provocation.  Pt left in supine with all needs within reach. Alarm activated.  Therapy Documentation Precautions:  Precautions Precautions: Back, Fall Precaution Comments: verbally reviewed back precautions.  Restrictions Weight Bearing Restrictions: No    Therapy/Group: Individual Therapy  Breck Coons, PT, DPT 07/02/2019, 4:19 PM

## 2019-07-02 NOTE — Progress Notes (Signed)
Physical Therapy Session Note  Patient Details  Name: Brandi Stephens MRN: [id number] Date of Birth: [birth year]  Today's Date: 07/02/2019 PT Individual Time: 1110-1206 PT Individual Time Calculation (min): 56 min   Short Term Goals: Week 1:  PT Short Term Goal 1 (Week 1): = to LTGs based on ELOS  Skilled Therapeutic Interventions/Progress Updates:   Pt received supine in bed reporting this AM she had a good OT session with minimal back muscle spasms and was able to complete a shower. Pt agreeable to therapy session. Supine>sit R EOB via logroll technique with pt continuing to demonstrate onset of muscle spasms during rolling that became more severe upon sitting EOB causing spike in her pain levels - therapist provided breathing and relaxation strategies for pain management. Discussed pt's continued increased difficulty and pain with performing bed mobility of rolling and supine<>sit therefore discussed performing repeated exposure therapy of these tasks in a pain free range. Sit>supine with min assist for LE management into bed via reverse logroll with mod cuing for sequencing. Performed repeated rolling supine>R sidelying and supine>L sidelying 2sets of 10reps each with emphasis on breathing for muscle spasm management - max cuing throughout for timing of exhalation on exertion with focus on decreasing pt's pain and motor planning/motor control to activate muscles in proper sequence to move in pain free motion - pt able to roll minimally in each direction without pain. Also, demonstrates significant limitation in L hip flexion mobility when in supine due to increase pian therefore performed repeated L LE hip flexion through small ROM per pt tolerance  x10 reps with continued focus on timing movement with exhalation for pain management and pt demonstrating minimal LE movement prior to onset of pain. Pt agreeable to attempt sitting in chair for 35minutes to progress OOB sitting tolerance. Supine>R  sitting EOB with CGA/min assist for trunk upright with continued cuing for exhalation on exertion and pt continuing to demonstrate increased pain/muscle spasms with full sidelying and trunk upright. Stand pivot EOB>recliner chair using RW with CGA for coming to stand and balance from elevated EOB. Pt left seated in recliner with needs in reach, pillows for support, and seat belt alarm on. Educated pt on calling for nursing assistance back to bed if pain not tolerable.  Therapy Documentation Precautions:  Precautions Precautions: Back, Fall Precaution Comments: verbally reviewed back precautions.  Restrictions Weight Bearing Restrictions: No  Pain: Pt continues to demonstrate increased pain with mobility due to muscle spasms - reports medication administered - therapy session focused on relaxation and breathing techniques to perform bed mobility with decreased pain.    Therapy/Group: Individual Therapy  Tawana Scale, PT, DPT 07/02/2019, 7:55 AM

## 2019-07-02 NOTE — Progress Notes (Signed)
[facility] PHYSICAL MEDICINE & REHABILITATION PROGRESS NOTE   Subjective/Complaints: Spasms much improved this morning. Still no BM since 3/12 KUB yesterday shows mild ileus and gas.  Does not have much appetite.   ROS:   Pt denies SOB, CP, and vision changes   Objective:   DG Abd 1 View  Result Date: 07/01/2019 CLINICAL DATA:  Severe abdominal pain EXAM: ABDOMEN - 1 VIEW COMPARISON:  None. FINDINGS: Mild gaseous distention of the colon. No evidence of bowel obstruction. No organomegaly or free air. No suspicious calcification. No acute bony abnormality. Postoperative changes in the lumbar spine. IMPRESSION: Mild gaseous distention of the colon, nonspecific. This could reflect mild ileus. No evidence of bowel obstruction or free air. Electronically Signed   By: Rolm Baptise M.D.   On: 07/01/2019 12:20   Recent Labs    07/01/19 0508  WBC 10.9*  HGB 10.2*  HCT 32.5*  PLT 444*   Recent Labs    07/01/19 0508  NA 138  K 4.6  CL 101  CO2 25  GLUCOSE 177*  BUN 26*  CREATININE 1.05*  CALCIUM 9.8    Intake/Output Summary (Last 24 hours) at 07/02/2019 0917 Last data filed at 07/01/2019 2130 Gross per 24 hour  Intake 600 ml  Output --  Net 600 ml     Physical Exam: Vital Signs Blood pressure 126/61, pulse (!) 59, temperature 98.5 F (36.9 C), temperature source Oral, resp. rate 18, height 5\' 8"  (1.727 m), weight 114 kg, SpO2 99 %.  Vitals reviewed. Constitutional: Appears comfortable this morning.  HENT:  Head: conjugate gaze CV: RRR; no JVD  Respiratory: CTA B/L- good air movement GI: soft, NT, ND, (+)BS- TTP over LLQ near groin but still in abdomen- no rebound- nothing palpated per se.  Musculoskeletal:     Comments: No edema or tenderness in extremities  Neurological: Ox3 Cooperative with exam. Generalized tremor noted Motor: Bilateral upper extremities: 4-4+/5 proximal distal Bilateral lower extremities: 4-4+/5 proximal distal Sensation diminished to light  touch distal extremities  Skin:  Back incision well healing from lumbar to sacrum- no drainage or erythema Psychiatric: talkative; appropriate.    Assessment/Plan: 1. Functional deficits secondary to acute on chronic back pain S/P recent L2-L3 fusion 05/30/2019. which require 3+ hours per day of interdisciplinary therapy in a comprehensive inpatient rehab setting.  Physiatrist is providing close team supervision and 24 hour management of active medical problems listed below.  Physiatrist and rehab team continue to assess barriers to discharge/monitor patient progress toward functional and medical goals  Care Tool:  Bathing  Bathing activity did not occur: Refused Body parts bathed by patient: Right arm, Left arm, Chest, Abdomen, Face     Body parts n/a: Front perineal area, Buttocks, Right upper leg, Left upper leg, Right lower leg   Bathing assist Assist Level: Set up assist     Upper Body Dressing/Undressing Upper body dressing Upper body dressing/undressing activity did not occur (including orthotics): N/A What is the patient wearing?: Pull over shirt    Upper body assist Assist Level: Minimal Assistance - Patient > 75%    Lower Body Dressing/Undressing Lower body dressing      What is the patient wearing?: Pants, Underwear/pull up     Lower body assist Assist for lower body dressing: Moderate Assistance - Patient 50 - 74%     Toileting Toileting    Toileting assist Assist for toileting: (P) Moderate Assistance - Patient 50 - 74% Assistive Device Comment: (walker)   Transfers  Chair/bed transfer  Transfers assist     Chair/bed transfer assist level: Supervision/Verbal cueing     Locomotion Ambulation   Ambulation assist      Assist level: Contact Guard/Touching assist Assistive device: Walker-rolling Max distance: 150'   Walk 10 feet activity   Assist     Assist level: Contact Guard/Touching assist Assistive device: Walker-rolling   Walk  50 feet activity   Assist    Assist level: Contact Guard/Touching assist Assistive device: Walker-rolling    Walk 150 feet activity   Assist    Assist level: Contact Guard/Touching assist Assistive device: Walker-rolling    Walk 10 feet on uneven surface  activity   Assist     Assist level: Minimal Assistance - Patient > 75% Assistive device: Aeronautical engineer Will patient use wheelchair at discharge?: No             Wheelchair 50 feet with 2 turns activity    Assist            Wheelchair 150 feet activity     Assist          Blood pressure 126/61, pulse (!) 59, temperature 98.5 F (36.9 C), temperature source Oral, resp. rate 18, height 5\' 8"  (1.727 m), weight 114 kg, SpO2 99 %.  Medical Problem List and Plan: 1.  Decreased functional mobility secondary to spinal stenosis S/P recent L2-L3 fusion 05/30/2019.  Decadron taper per neurosurgery             -patient may shower after covering incision             -ELOS/Goals: 8-13 days/supervision/min a            -Continue CIR therapies 2.  Antithrombotics: -DVT/anticoagulation: SCDs.               Vascular study ordered             -antiplatelet therapy: N/A 3. Pain Management: Voltaren gel 4 times daily, Cymbalta 60 mg daily, Robaxin-750 mig 3 times daily, MS Contin 15 mg every 12 hours, oxycodone as needed for breakthrough pain  3/13- increase Robaxin up to 1000 mg TID for spasms since they are biggest issue  3/14- improved muscle spasms ,but somewhat better- con't meds  3/15: Added Tizanidine 4mg  q6H prn for severe spasms. Asked nurse Ed to give one dose this morning.   3/16: Spasms much improved with this regimen.              Monitor with increased exertion 4. Mood: Provide emotional support             -antipsychotic agents: N/A 5. Neuropsych: This patient is capable of making decisions on her own behalf. 6. Skin/Wound Care: Routine skin checks 7.  Fluids/Electrolytes/Nutrition: Routine in and outs.  CMP ordered 8.  Acute on chronic anemia.  CBC ordered 9.  AKI on CKD stage III.  CMP ordered  3/13- Last Cr 1.25- up from 1.1- BUN 41- encourage fluids  3/14- pt admits to drinking ~ 1000cc/day- explains need to drink 2L/day minimim- said never had kidney issues 10.  Hypertension.  Toprol-XL 12.5 mg daily.               Monitor with increased mobility  3/14- BP well controlled- con't regimen.  11.  Obesity.  BMI 38.67.  Dietary follow-up 12.  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  Follow-up outpatient Dr. Jana Hakim.  Continue  Armidex 13.  Hyperlipidemia.  Lipitor 14.  History of gout.  Continue Zyloprim. 15.  Hypothyroidism.  TSH 1.420.  Continue Synthroid 16.  Chronic constipation.  Adjust bowel meds as necessary  3/14- on miralax BID- see if can have BM- could be cause of LLQ pain.   3/15: Increase Colace to TID. Refuses enema or mag citrate at this time.   3/16: Added senna HS.  17.  COPD/quit smoking 13 months ago.  Check oxygen saturations every shift 18.  Tremors with restless leg syndrome.  Continue Requip as well as Mysoline  19. Hyperkalemia  3/13- Last K+ 5.1- will recheck Monday.   3/15: stable at 4.6 20. LLQ pain- is near groin, but still in abdomen- biggest source of pain except back spasms. -If not improved with BM, suggest U/S or CT.   3/15: KUB to assess for kidney stone vs. Constipation  3/16: KUB reviewed and discussed with patient: shows mild ileus and gas. Patient has multiple risk factors for ileus, including impaired mobility, obesity, hyperkalemia, hypothyroidism. She is already not eating much. Prescribed Mylanta for gas.   LOS: 4 days A FACE TO FACE EVALUATION WAS PERFORMED  Brandi Stephens 07/02/2019, 9:17 AM

## 2019-07-03 ENCOUNTER — Inpatient Hospital Stay (HOSPITAL_COMMUNITY): Payer: PPO

## 2019-07-03 ENCOUNTER — Inpatient Hospital Stay (HOSPITAL_COMMUNITY): Payer: PPO | Admitting: Occupational Therapy

## 2019-07-03 ENCOUNTER — Other Ambulatory Visit: Payer: Self-pay | Admitting: Family Medicine

## 2019-07-03 ENCOUNTER — Inpatient Hospital Stay (HOSPITAL_COMMUNITY): Payer: PPO | Admitting: Physical Therapy

## 2019-07-03 MED ORDER — METAXALONE 800 MG PO TABS
800.0000 mg | ORAL_TABLET | Freq: Three times a day (TID) | ORAL | Status: DC
  Administered 2019-07-03 – 2019-07-11 (×25): 800 mg via ORAL
  Filled 2019-07-03 (×28): qty 1

## 2019-07-03 MED ORDER — BISACODYL 10 MG RE SUPP
10.0000 mg | Freq: Once | RECTAL | Status: AC
  Administered 2019-07-03: 10 mg via RECTAL
  Filled 2019-07-03: qty 1

## 2019-07-03 MED ORDER — ALUM & MAG HYDROXIDE-SIMETH 200-200-20 MG/5ML PO SUSP
15.0000 mL | Freq: Three times a day (TID) | ORAL | Status: DC
  Administered 2019-07-03 – 2019-07-11 (×25): 15 mL via ORAL
  Filled 2019-07-03 (×25): qty 30

## 2019-07-03 NOTE — Progress Notes (Signed)
Physical Therapy Session Note  Patient Details  Name: Brandi Stephens MRN: [id number] Date of Birth: 1946/04/14  Today's Date: 07/03/2019 PT Individual Time: 1300-1328 PT Individual Time Calculation (min): 28 min   Short Term Goals: Week 1:  PT Short Term Goal 1 (Week 1): = to LTGs based on ELOS  Skilled Therapeutic Interventions/Progress Updates:    Patient received in bed, having increased levels of pain today, reports she didn't sleep well at all due to cramping and pain. Required MinA-ModA and extended time/additional rest breaks due to pain and spasm. Cues for correct technique to avoid breaking lumbar precautions. Once standing, she was able to tolerate gait training approximately 190f with RW but continued to have significant levels of pain, nursing staff made aware. Required ModA to return to bed, left in bed with alarm active and all needs otherwise met, daughter present this afternoon.   Therapy Documentation Precautions:  Precautions Precautions: Back, Fall Precaution Comments: verbally reviewed back precautions.  Restrictions Weight Bearing Restrictions: No Pain: Pain Assessment Pain Scale: 0-10 Pain Score: 8  Pain Type: Surgical pain Pain Location: Back Pain Orientation: Left;Lower Pain Descriptors / Indicators: Spasm;Throbbing;Cramping Pain Onset: On-going Patients Stated Pain Goal: 3 Pain Intervention(s): Medication (See eMAR) Multiple Pain Sites: No    Therapy/Group: Individual Therapy   KWindell [place] DPT, PN1   Supplemental Physical Therapist [place]   Pager [phone] Rehab Office [phone]   07/03/2019, 3:38 PM

## 2019-07-03 NOTE — Telephone Encounter (Signed)
Pt is still on this. She is currently in the hospital and they have been giving it there

## 2019-07-03 NOTE — Telephone Encounter (Signed)
Is this okay to refill? 

## 2019-07-03 NOTE — Progress Notes (Signed)
Physical Therapy Session Note  Patient Details  Name: Brandi Stephens MRN: [id number] Date of Birth: [birth year]  Today's Date: 07/03/2019 PT Individual Time: 1420-1510 PT Individual Time Calculation (min): 50 min   Short Term Goals: Week 1:  PT Short Term Goal 1 (Week 1): = to LTGs based on ELOS  Skilled Therapeutic Interventions/Progress Updates: Pt presented in bed with dgt present agreeable to therapy. Pt stating pain 6-7/10 BLE. Session focused on lumbar stabilization in preparation for bed mobility due to increased pain. Pt scheduled for meds in 1 hour thus all activity performed WLP. Pt was able to tolerate PROM hip flexion/ext x8 followed by AAROM with physioball x 10 BLE with pauses due to intermittent spasm. Pt required vc's for abdominal bracing with activity for pain management. Pt then performed hip abd/add with cues for bracing through range with pt able to tolerate x 5 until increased spasm. Pt educated in TA contraction with palpation (poking under iliac crest) for biofeedback and provided edu on performing these at rest as to learn to incorporate this during functional activities. Pt was able to perform segmental bridges to improve alignment. Pt then with onset of nausea and dry heaving. Pt's dgt inquired regarding status and anticipated d/c date. Provided info and left sticky note for MD to call daughter. Pt continued with bouts of nausea, nsg aware and pt left with bed alarm on, call bell within reach and needs met. 10 min missed due to nausea and spasms.      Therapy Documentation Precautions:  Precautions Precautions: Back, Fall Precaution Comments: verbally reviewed back precautions.  Restrictions Weight Bearing Restrictions: No General: PT Missed Treatment Reason: Pain;Patient ill (Comment) Vital Signs: Therapy Vitals Temp: 98.1 F (36.7 C) Pulse Rate: 77 Resp: 19 BP: (!) 99/52 Patient Position (if appropriate): Lying Oxygen Therapy SpO2: 92 % O2 Device: Room  Air Pain: Pain Assessment Pain Scale: 0-10 Pain Score: 8  Pain Type: Surgical pain Pain Location: Back Pain Orientation: Left;Lower Pain Descriptors / Indicators: Spasm;Throbbing;Cramping Pain Onset: On-going Patients Stated Pain Goal: 3 Pain Intervention(s): Medication (See eMAR) Multiple Pain Sites: No    Therapy/Group: Individual Therapy  [place]  Hadyn Azer, PTA  07/03/2019, 3:42 PM

## 2019-07-03 NOTE — Patient Care Conference (Signed)
Inpatient RehabilitationTeam Conference and Plan of Care Update Date: 07/03/2019   Time: 11:10 AM    Patient Name: Brandi Stephens      Medical Record Number: [id number]  Date of Birth: 1945-08-12 Sex: Female         Room/Bed: 4M07C/4M07C-01 Payor Info: Payor: [employer] / Plan: [employer] Must PPO / Product Type: *No Product type* /    Admit Date/Time:  06/28/2019  4:12 PM  Primary Diagnosis:  Spinal stenosis of lumbar region  Patient Active Problem List   Diagnosis Date Noted  . Lumbar radiculopathy 06/28/2019  . Spinal stenosis of lumbar region 06/28/2019  . Acute on chronic anemia   . Tremors of nervous system   . AKI (acute kidney injury) ([place])   . Chronic pain syndrome   . Acute renal failure superimposed on stage 3 chronic kidney disease ([place]) 06/25/2019  . Acute osteomyelitis of lumbar spine ([place]) 06/25/2019  . Weakness of left lower extremity   . Abdominal pain 06/24/2019  . Nausea 06/24/2019  . Abnormal urine odor 06/24/2019  . History of sepsis 06/24/2019  . Recent major surgery 06/24/2019  . History of diverticulosis 06/24/2019  . Elevated PTHrP level 05/09/2019  . Paresthesia 02/12/2019  . Low back pain 02/12/2019  . Neuropathy 12/20/2018  . Lumbar spinal stenosis 06/29/2018  . Preoperative clearance 06/04/2018  . Abnormal CT of the chest 06/04/2018  . Smoker 05/17/2018  . Hypothyroidism 11/30/2017  . Thoracic aortic atherosclerosis ([place]) 09/14/2016  . Benign essential tremor 09/09/2016  . RLS (restless legs syndrome) 09/09/2016  . H/O adenomatous polyp of colon 07/06/2016  . PVC's (premature ventricular contractions) 03/29/2016  . Preoperative cardiovascular examination 03/29/2016  . Grade I diastolic dysfunction 0000000  . Palpitations 01/11/2016  . Shortness of breath 01/11/2016  . Former cigarette smoker 01/11/2016  . Leg edema 01/11/2016  . Chronic cough 10/28/2015  . 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 Malignant neoplasm of  upper-inner quadrant of left breast in female, estrogen receptor positive ([place]) 07/14/2015  . Prediabetes 07/08/2015  . CKD (chronic kidney disease), stage III 07/08/2015  . Dependent edema 06/08/2015  . Sleep disturbance 06/08/2015  . Mixed hyperlipidemia 06/08/2015  . Generalized anxiety disorder 06/08/2015  . Essential hypertension 06/08/2015  . Emphysema lung ([place]) 06/08/2015  . Gout 06/08/2015  . Macrocytic anemia   . 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 Sepsis ([place]) 05/15/2015  . Lumbar spondylosis 03/20/2015    Expected Discharge Date: Expected Discharge Date: 07/10/19  Team Members Present: Physician leading conference: Dr. Leeroy Cha Care Coodinator Present: Nestor Lewandowsky, RN, BSN, CRRN;Genie Mekenzie Modeste, RN, MSN Nurse Present: Other (comment)(Nadia Kateri Mc, RN) PT Present: Michaelene Song, PT OT Present: Meriel Pica, OT SLP Present: Stormy Fabian, SLP PPS Coordinator present : Gunnar Fusi, SLP     Current Status/Progress Goal Weekly Team Focus  Bowel/Bladder   Continent of B/B, LBM 06/28/19, patient is constipated and has prn medication  Maintain contience  Assess dailty toileting and administer medications as needed, encourage po liquis( water)   Swallow/Nutrition/ Hydration             ADL's   set up UB, min - mod A LB, supervision to CGA with ambulation into bathroom and bathroom transfers; very limited by pain and muscle spasms  supervision with bathroom transfers, S with bathing and toileting, mod I with dynamic standing balance and dressing.  ADL training, functional mobility, pt education, AE training   Mobility   min assist supine<>sit, CGA sit<>stand and stand  pivot transfers using RW, gait up to 226ft using RW with CGA, 4 steps using B HRs with CGA  supervision overall at ambulatory level up to 278ft as well as performing 12 step navigation  pain management, activity tolerance, pt/family education, bed mobility, transfer, gait training, stair  navigation   Communication             Safety/Cognition/ Behavioral Observations            Pain   Continue to rate her pain score 7-8/10 on pain score with increase spasms to back currently on MS Contin q12 hs, Oxycodone  Pain goal <3  Assess QS/PRN  with f/u   Skin              Rehab Goals Patient on target to meet rehab goals: Yes *See Care Plan and progress notes for long and short-term goals.     Barriers to Discharge  Current Status/Progress Possible Resolutions Date Resolved   Nursing                  PT  Inaccessible home environment;Home environment access/layout                 OT                  SLP                SW Home environment access/layout B+B upstairs (14 steps)            Discharge Planning/Teaching Needs:  Home with spouse and daughter  TBD; transfers, toileting, medications, etc   Team Discussion: MD has spasms, meds adjusted, mild ileus, no BM since 3/12.  RN cont urine, will give suppository today.  OT back spasms, amb to bathroom S, min A LB D, Stand S, goals S.  PT min bed, transfers CGA, amb 200' RW CGA, 4 steps CGA B hand rails, goals S.   Revisions to Treatment Plan: N/A     Medical Summary Current Status: Ambulating well with PT, therapy is still limited by spasms but they are under better control, conitnues to have gas and constipation Weekly Focus/Goal: Better control of spasms; trial of Skelaxin today; added Tizanidine prn for severe spasms and advised that she take these at night  Barriers to Discharge: Medical stability   Possible Resolutions to Barriers: Better spasm control, monitoring of insicion site, better control of gas and constipation, continued intensive therapies   Continued Need for Acute Rehabilitation Level of Care: The patient requires daily medical management by a physician with specialized training in physical medicine and rehabilitation for the following reasons: Direction of a multidisciplinary physical  rehabilitation program to maximize functional independence : Yes Medical management of patient stability for increased activity during participation in an intensive rehabilitation regime.: Yes Analysis of laboratory values and/or radiology reports with any subsequent need for medication adjustment and/or medical intervention. : Yes   I attest that I was present, lead the team conference, and concur with the assessment and plan of the team.   Retta Diones 07/03/2019, 3:00 PM   Team conference was held via web/ teleconference due to [place] - 19

## 2019-07-03 NOTE — Progress Notes (Signed)
Occupational Therapy Session Note  Patient Details  Name: Brandi Stephens MRN: [id number] Date of Birth: [birth year]  Today's Date: 07/03/2019 OT Individual Time: 0930-1015 OT Individual Time Calculation (min): 45 min    Short Term Goals: Week 1:  OT Short Term Goal 1 (Week 1): STG = LTG  Skilled Therapeutic Interventions/Progress Updates:    Pt received in bed and discussed therapy plans. She did not feel she needed a bath but wanted to get dressed. Picked out clothing for patient, but when she started to roll onto her side to sit up she began having intense spasms in her back.   Instead, shifted gears to work on LE and core strengthening. Pt tends to have spasms when she is actively flexing her hip from straight to bent knees.  Worked on PROM of knee/ hip flexion and then placed her foot on a medium sized therapy ball for AROM of moving leg back and forth. Pt tolerated this fairly well but when she switched to her L leg had increased spasms after 10 reps.  Worked on hamstring and glute strengthening with pushing foot down into the ball as if she was pressing on a brake pedal.  Added in dynamic movement of plantar flexion with knee flexion and dorsiflexion with knee extension for a pilates based leg extension exercises. Core strengthening with small pelvic tilts and isometric transverse abdominis strength.  Placed pillow between knees for hip adduction active contractions and she then worked on slow gentle rolling from side to side in a pain free range. Able to do several reps but on the last rep had a severe back spasm and had to rest.  Elevated LE on pillows for more back support and pt set up in bed with all needs met. Bed alarm set.   Therapy Documentation Precautions:  Precautions Precautions: Back, Fall Precaution Comments: verbally reviewed back precautions.  Restrictions Weight Bearing Restrictions: No   Pain: Pain Assessment Pain Scale: 0-10 Pain Score: 6  Pain Location:  Bladder Pain Orientation: Mid;Lower Pain Descriptors / Indicators: Spasm;Sharp Pain Frequency: Constant Pain Onset: On-going Patients Stated Pain Goal: 3 Pain Intervention(s): Medication (See eMAR) Multiple Pain Sites: No ADL: ADL Eating: Set up Grooming: Setup Where Assessed-Grooming: Sitting at sink Upper Body Bathing: Setup Where Assessed-Upper Body Bathing: Shower Lower Body Bathing: Moderate assistance Where Assessed-Lower Body Bathing: Shower Upper Body Dressing: Setup Where Assessed-Upper Body Dressing: Wheelchair Lower Body Dressing: Moderate assistance Where Assessed-Lower Body Dressing: Wheelchair Toileting: Minimal assistance Where Assessed-Toileting: Glass blower/designer: Therapist, music Method: Counselling psychologist: Energy manager: Curator Method: Heritage manager: Radio broadcast assistant ADL Comments: declined shower   Therapy/Group: Individual Therapy  [place] 07/03/2019, 1:02 PM

## 2019-07-03 NOTE — Progress Notes (Signed)
Physical Therapy Session Note  Patient Details  Name: Brandi Stephens MRN: [id number] Date of Birth: [birth year]  Today's Date: 07/03/2019 PT Individual Time: 1132-1202 PT Individual Time Calculation (min): 30 min   Short Term Goals: Week 1:  PT Short Term Goal 1 (Week 1): = to LTGs based on ELOS  Skilled Therapeutic Interventions/Progress Updates:     Pt received supine in bed. Verbalizes slight aggravation of back pain symptoms following OT session this AM. PT session focusing on pain free ROM of BLEs and core activation for low back support and pain management. Pt performs x3 sets of x10 reps of supine hip external/internal rotation in hooklying position. PT then manually brings pt's RLE into ~90 degrees hip and knee flexion, with pt using R hand to press into R knee. Pt progresses to performing isometric RUE extension and R hip flexion, holding for several seconds each rep. x10 reps total. Pt performs same with LLE/LUE. Pt has tendency to contract knee flexors on L side, exacerbating back spasms. Able to correct with verbal and tactile cues.   Pt left in supine with all needs within reach. Alarm activated.  Therapy Documentation Precautions:  Precautions Precautions: Back, Fall Precaution Comments: verbally reviewed back precautions.  Restrictions Weight Bearing Restrictions: No    Therapy/Group: Individual Therapy  Breck Coons, PT, DPT 07/03/2019, 2:11 PM

## 2019-07-03 NOTE — Telephone Encounter (Signed)
Can you check with her to make sure they did not change this medication while she was in the hospital or now that she is in [facility] rehab please.

## 2019-07-03 NOTE — Progress Notes (Signed)
Physical Therapy Session Note  Patient Details  Name: Brandi Stephens MRN: [id number] Date of Birth: 1945/08/23  Today's Date: 07/03/2019 PT Individual Time: 1630-1700 PT Individual Time Calculation (min): 30 min    Short Term Goals: Week 1:  PT Short Term Goal 1 (Week 1): = to LTGs based on ELOS  Skilled Therapeutic Interventions/Progress Updates:    Pt supine in bed upon PT arrival, agreeable to therapy tx and reports pain 6/10 in lumbar spine and radiating into L hip- therapist assisted with repositioning, log roll techniques with mobility and applied heat at end of session for pain management. Pt transferred to sitting EOB with CGA and increased time, cues for deep breaths and log roll techniques for back precautions and to limit spasms. Sitting EOB therapist performed gentle scar tissue mobilization. Pt reports having to use bathroom. Ambulated from bed>toilet x 10 ft with RW and CGA, continent of bladder and performed clothing management without assist. Pt ambulated to the sink to wash hands, brush hair and apply moroccan oil to hair. Pt ambulated throughout the unit this session x 200 ft with RW and supervision working on activity tolerance and mobility. Pt transferred back to bed and able to transfer to supine with min assist for L LE today. Pt left supine with needs in reach and bed alarm set.   Therapy Documentation Precautions:  Precautions Precautions: Back, Fall Precaution Comments: verbally reviewed back precautions.  Restrictions Weight Bearing Restrictions: No    Therapy/Group: Individual Therapy  Netta Corrigan, PT, DPT, CSRS 07/03/2019, 7:52 AM

## 2019-07-03 NOTE — Progress Notes (Signed)
[facility] PHYSICAL MEDICINE & REHABILITATION PROGRESS NOTE   Subjective/Complaints: Spasms are better but continue to limit therapy; will add Skelaxin during the day to avoid oversedation, advised Brandi Stephens to ask for Tizanidine at night if she wakes up from spasms that prevent her from returning to sleep. Still no BM since 3/12. She is willing to take a suppository today.   ROS:  Pt denies SOB, CP, and vision changes   Objective:   DG Abd 1 View  Result Date: 07/01/2019 CLINICAL DATA:  Severe abdominal pain EXAM: ABDOMEN - 1 VIEW COMPARISON:  None. FINDINGS: Mild gaseous distention of the colon. No evidence of bowel obstruction. No organomegaly or free air. No suspicious calcification. No acute bony abnormality. Postoperative changes in the lumbar spine. IMPRESSION: Mild gaseous distention of the colon, nonspecific. This could reflect mild ileus. No evidence of bowel obstruction or free air. Electronically Signed   By: Rolm Baptise M.D.   On: 07/01/2019 12:20   Recent Labs    07/01/19 0508  WBC 10.9*  HGB 10.2*  HCT 32.5*  PLT 444*   Recent Labs    07/01/19 0508  NA 138  K 4.6  CL 101  CO2 25  GLUCOSE 177*  BUN 26*  CREATININE 1.05*  CALCIUM 9.8    Intake/Output Summary (Last 24 hours) at 07/03/2019 1016 Last data filed at 07/02/2019 1854 Gross per 24 hour  Intake 360 ml  Output --  Net 360 ml     Physical Exam: Vital Signs Blood pressure 114/67, pulse 84, temperature 98.4 F (36.9 C), temperature source Oral, resp. rate 20, height 5\' 8"  (1.727 m), weight 114 kg, SpO2 91 %. Vitals reviewed. Constitutional: Working with OT this morning on bed-level exercises.  HENT:  Head: conjugate gaze CV: RRR; no JVD  Respiratory: CTA B/L- good air movement GI: soft, NT, ND, (+)BS- TTP over LLQ near groin but still in abdomen- no rebound- nothing palpated per se.  Musculoskeletal:     Comments: No edema or tenderness in extremities  Neurological: Ox3 Cooperative with  exam. Generalized tremor noted Motor: Bilateral upper extremities: 4-4+/5 proximal distal Bilateral lower extremities: 4-4+/5 proximal distal Sensation diminished to light touch distal extremities  Skin:  Back incision well healing from lumbar to sacrum- no drainage or erythema Psychiatric: talkative; appropriate.    Assessment/Plan: 1. Functional deficits secondary to acute on chronic back pain S/P recent L2-L3 fusion 05/30/2019. which require 3+ hours per day of interdisciplinary therapy in a comprehensive inpatient rehab setting.  Physiatrist is providing close team supervision and 24 hour management of active medical problems listed below.  Physiatrist and rehab team continue to assess barriers to discharge/monitor patient progress toward functional and medical goals  Care Tool:  Bathing    Body parts bathed by patient: Right arm, Left arm, Chest, Abdomen, Face, Front perineal area, Right upper leg, Left upper leg   Body parts bathed by helper: Buttocks, Left lower leg, Right lower leg Body parts n/a: Front perineal area, Buttocks, Right upper leg, Left upper leg, Right lower leg   Bathing assist Assist Level: Moderate Assistance - Patient 50 - 74%     Upper Body Dressing/Undressing Upper body dressing Upper body dressing/undressing activity did not occur (including orthotics): N/A What is the patient wearing?: Bra, Pull over shirt    Upper body assist Assist Level: Set up assist    Lower Body Dressing/Undressing Lower body dressing      What is the patient wearing?: Pants, Underwear/pull up  Lower body assist Assist for lower body dressing: Moderate Assistance - Patient 50 - 74%     Toileting Toileting    Toileting assist Assist for toileting: Minimal Assistance - Patient > 75% Assistive Device Comment: (walker)   Transfers Chair/bed transfer  Transfers assist     Chair/bed transfer assist level: Contact Guard/Touching assist Chair/bed transfer  assistive device: Programmer, multimedia   Ambulation assist      Assist level: Supervision/Verbal cueing Assistive device: Walker-rolling Max distance: 150'   Walk 10 feet activity   Assist     Assist level: Supervision/Verbal cueing Assistive device: Walker-rolling   Walk 50 feet activity   Assist    Assist level: Supervision/Verbal cueing Assistive device: Walker-rolling    Walk 150 feet activity   Assist    Assist level: Supervision/Verbal cueing Assistive device: Walker-rolling    Walk 10 feet on uneven surface  activity   Assist     Assist level: Minimal Assistance - Patient > 75% Assistive device: Aeronautical engineer Will patient use wheelchair at discharge?: No             Wheelchair 50 feet with 2 turns activity    Assist            Wheelchair 150 feet activity     Assist          Blood pressure 114/67, pulse 84, temperature 98.4 F (36.9 C), temperature source Oral, resp. rate 20, height 5\' 8"  (1.727 m), weight 114 kg, SpO2 91 %.  Medical Problem List and Plan: 1.  Decreased functional mobility secondary to spinal stenosis S/P recent L2-L3 fusion 05/30/2019.  Decadron taper per neurosurgery             -patient may shower after covering incision             -ELOS/Goals: 8-13 days/supervision/min a            -Continue CIR therapies 2.  Antithrombotics: -DVT/anticoagulation: SCDs.               Vascular study ordered             -antiplatelet therapy: N/A 3. Pain Management: Voltaren gel 4 times daily, Cymbalta 60 mg daily, Robaxin-750 mig 3 times daily, MS Contin 15 mg every 12 hours, oxycodone as needed for breakthrough pain  3/13- increase Robaxin up to 1000 mg TID for spasms since they are biggest issue  3/14- improved muscle spasms ,but somewhat better- con't meds  3/15: Added Tizanidine 4mg  q6H prn for severe spasms. Asked nurse Ed to give one dose this morning.   3/16:  Spasms much improved with this regimen.   3/17: Spasms are better but continue to limit therapy; will add Skelaxin during the day to avoid oversedation, advised Brandi Stephens to ask for Tizanidine at night if she wakes up from spasms that prevent her from returning to sleep.              Monitor with increased exertion 4. Mood: Provide emotional support             -antipsychotic agents: N/A 5. Neuropsych: This patient is capable of making decisions on her own behalf. 6. Skin/Wound Care: Routine skin checks 7. Fluids/Electrolytes/Nutrition: Routine in and outs.  CMP ordered 8.  Acute on chronic anemia.  CBC ordered 9.  AKI on CKD stage III.  CMP ordered  3/13- Last Cr 1.25- up from 1.1- BUN 41-  encourage fluids  3/14- pt admits to drinking ~ 1000cc/day- explains need to drink 2L/day minimim- said never had kidney issues 10.  Hypertension.  Toprol-XL 12.5 mg daily.               Monitor with increased mobility  3/14- BP well controlled- con't regimen.  11.  Obesity.  BMI 38.67.  Dietary follow-up 12.  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  Follow-up outpatient Dr. Jana Hakim.  Continue Armidex 13.  Hyperlipidemia.  Lipitor 14.  History of gout.  Continue Zyloprim. 15.  Hypothyroidism.  TSH 1.420.  Continue Synthroid 16.  Chronic constipation.  Adjust bowel meds as necessary  3/14- on miralax BID- see if can have BM- could be cause of LLQ pain.   3/15: Increase Colace to TID. Refuses enema or mag citrate at this time.   3/16: Added senna HS.   3/17: Still no BM since 3/12. She is willing to take a suppository today. 17.  COPD/quit smoking 13 months ago.  Check oxygen saturations every shift 18.  Tremors with restless leg syndrome.  Continue Requip as well as Mysoline  19. Hyperkalemia  3/13- Last K+ 5.1- will recheck Monday.   3/15: stable at 4.6 20. LLQ pain- is near groin, but still in abdomen- biggest source of pain except back spasms. -If not improved with BM,  suggest U/S or CT.   3/15: KUB to assess for kidney stone vs. Constipation  3/16: KUB reviewed and discussed with patient: shows mild ileus and gas. Patient has multiple risk factors for ileus, including impaired mobility, obesity, hyperkalemia, hypothyroidism. She is already not eating much. Prescribed Mylanta for gas.   3/17: She has not requested Mylanta; will schedule  LOS: 5 days A FACE TO FACE EVALUATION WAS PERFORMED  Brandi Stephens 07/03/2019, 10:16 AM

## 2019-07-04 ENCOUNTER — Inpatient Hospital Stay (HOSPITAL_COMMUNITY): Payer: PPO

## 2019-07-04 DIAGNOSIS — E782 Mixed hyperlipidemia: Secondary | ICD-10-CM

## 2019-07-04 DIAGNOSIS — N179 Acute kidney failure, unspecified: Secondary | ICD-10-CM

## 2019-07-04 DIAGNOSIS — E039 Hypothyroidism, unspecified: Secondary | ICD-10-CM

## 2019-07-04 LAB — URINALYSIS, ROUTINE W REFLEX MICROSCOPIC
Glucose, UA: NEGATIVE mg/dL
Hgb urine dipstick: NEGATIVE
Ketones, ur: NEGATIVE mg/dL
Nitrite: NEGATIVE
Protein, ur: NEGATIVE mg/dL
Specific Gravity, Urine: 1.026 (ref 1.005–1.030)
Squamous Epithelial / HPF: >50 — ABNORMAL HIGH (ref 0–5)
pH: 5 (ref 5.0–8.0)

## 2019-07-04 LAB — COMPREHENSIVE METABOLIC PANEL
ALT: 31 U/L (ref 0–44)
AST: 25 U/L (ref 15–41)
Albumin: 2.5 g/dL — ABNORMAL LOW (ref 3.5–5.0)
Alkaline Phosphatase: 92 U/L (ref 38–126)
Anion gap: 10 (ref 5–15)
BUN: 36 mg/dL — ABNORMAL HIGH (ref 8–23)
CO2: 22 mmol/L (ref 22–32)
Calcium: 9.1 mg/dL (ref 8.9–10.3)
Chloride: 98 mmol/L (ref 98–111)
Creatinine, Ser: 1.76 mg/dL — ABNORMAL HIGH (ref 0.44–1.00)
GFR calc Af Amer: 33 mL/min — ABNORMAL LOW (ref >60)
GFR calc non Af Amer: 28 mL/min — ABNORMAL LOW (ref >60)
Glucose, Bld: 193 mg/dL — ABNORMAL HIGH (ref 70–99)
Potassium: 4.9 mmol/L (ref 3.5–5.1)
Sodium: 130 mmol/L — ABNORMAL LOW (ref 135–145)
Total Bilirubin: 0.6 mg/dL (ref 0.3–1.2)
Total Protein: 5.7 g/dL — ABNORMAL LOW (ref 6.5–8.1)

## 2019-07-04 LAB — CBC WITH DIFFERENTIAL/PLATELET
Abs Immature Granulocytes: 0.28 10*3/uL — ABNORMAL HIGH (ref 0.00–0.07)
Basophils Absolute: 0.1 10*3/uL (ref 0.0–0.1)
Basophils Relative: 1 %
Eosinophils Absolute: 0.2 10*3/uL (ref 0.0–0.5)
Eosinophils Relative: 2 %
HCT: 25.8 % — ABNORMAL LOW (ref 36.0–46.0)
Hemoglobin: 8.2 g/dL — ABNORMAL LOW (ref 12.0–15.0)
Immature Granulocytes: 3 %
Lymphocytes Relative: 12 %
Lymphs Abs: 1.3 10*3/uL (ref 0.7–4.0)
MCH: 30.9 pg (ref 26.0–34.0)
MCHC: 31.8 g/dL (ref 30.0–36.0)
MCV: 97.4 fL (ref 80.0–100.0)
Monocytes Absolute: 1.2 10*3/uL — ABNORMAL HIGH (ref 0.1–1.0)
Monocytes Relative: 11 %
Neutro Abs: 8.3 10*3/uL — ABNORMAL HIGH (ref 1.7–7.7)
Neutrophils Relative %: 71 %
Platelets: 411 10*3/uL — ABNORMAL HIGH (ref 150–400)
RBC: 2.65 MIL/uL — ABNORMAL LOW (ref 3.87–5.11)
RDW: 13.9 % (ref 11.5–15.5)
WBC: 11.4 10*3/uL — ABNORMAL HIGH (ref 4.0–10.5)
nRBC: 0 % (ref 0.0–0.2)

## 2019-07-04 LAB — VITAMIN B12: Vitamin B-12: 1594 pg/mL — ABNORMAL HIGH (ref 180–914)

## 2019-07-04 LAB — FERRITIN: Ferritin: 309 ng/mL — ABNORMAL HIGH (ref 11–307)

## 2019-07-04 LAB — IRON AND TIBC
Iron: 23 ug/dL — ABNORMAL LOW (ref 28–170)
Saturation Ratios: 9 % — ABNORMAL LOW (ref 10.4–31.8)
TIBC: 262 ug/dL (ref 250–450)
UIBC: 239 ug/dL

## 2019-07-04 MED ORDER — SODIUM CHLORIDE 0.9% FLUSH
10.0000 mL | Freq: Two times a day (BID) | INTRAVENOUS | Status: DC
  Administered 2019-07-05 – 2019-07-10 (×8): 10 mL
  Administered 2019-07-11: 10:00:00 20 mL

## 2019-07-04 MED ORDER — SULFAMETHOXAZOLE-TRIMETHOPRIM 400-80 MG PO TABS
1.0000 | ORAL_TABLET | Freq: Two times a day (BID) | ORAL | Status: DC
  Filled 2019-07-04 (×2): qty 1

## 2019-07-04 MED ORDER — SODIUM CHLORIDE 0.9% FLUSH: 10.0000 mL | INTRAVENOUS | Status: DC | PRN

## 2019-07-04 MED ORDER — CIPROFLOXACIN HCL 500 MG PO TABS
250.0000 mg | ORAL_TABLET | Freq: Two times a day (BID) | ORAL | Status: AC
  Administered 2019-07-04 – 2019-07-07 (×6): 250 mg via ORAL
  Filled 2019-07-04 (×6): qty 1

## 2019-07-04 MED ORDER — SODIUM CHLORIDE 0.9 % IV SOLN: INTRAVENOUS | Status: DC

## 2019-07-04 NOTE — Progress Notes (Signed)
[place] PHYSICAL MEDICINE & REHABILITATION PROGRESS NOTE   Subjective/Complaints: Had BM yesterday with suppository! Passing gas more easily with Mylanta.  Spasms are better controlled but still present. Neurosurgery evaluated this morning and felt patient was more pale and fatigued and ordered infectious workup; pending.   ROS:  Pt denies SOB, CP, and vision changes   Objective:   No results found. No results for input(s): WBC, HGB, HCT, PLT in the last 72 hours. No results for input(s): NA, K, CL, CO2, GLUCOSE, BUN, CREATININE, CALCIUM in the last 72 hours.  Intake/Output Summary (Last 24 hours) at 07/04/2019 1028 Last data filed at 07/03/2019 1905 Gross per 24 hour  Intake 400 ml  Output --  Net 400 ml     Physical Exam: Vital Signs Blood pressure (!) 96/52, pulse 74, temperature 98.6 F (37 C), temperature source Oral, resp. rate 18, height 5\' 8"  (1.727 m), weight 114 kg, SpO2 93 %. Vitals reviewed. Constitutional: Working with OT this morning on bed-level exercises.  Has good energy.  HENT:  Head: conjugate gaze CV: RRR; no JVD  Respiratory: CTA B/L- good air movement GI: soft, NT, ND, (+)BS- TTP over LLQ near groin but still in abdomen- no rebound- nothing palpated per se.  Musculoskeletal:     Comments: No edema or tenderness in extremities  Neurological: Ox3 Cooperative with exam. Generalized tremor noted Motor: Bilateral upper extremities: 4-4+/5 proximal distal Bilateral lower extremities: 4-4+/5 proximal distal Sensation diminished to light touch distal extremities  Skin:  Back incision well healing from lumbar to sacrum- no drainage or erythema Psychiatric: talkative; appropriate.    Assessment/Plan: 1. Functional deficits secondary to acute on chronic back pain S/P recent L2-L3 fusion 05/30/2019. which require 3+ hours per day of interdisciplinary therapy in a comprehensive inpatient rehab setting.  Physiatrist is providing close team supervision and  24 hour management of active medical problems listed below.  Physiatrist and rehab team continue to assess barriers to discharge/monitor patient progress toward functional and medical goals  Care Tool:  Bathing    Body parts bathed by patient: Right arm, Left arm, Chest, Abdomen, Face, Front perineal area, Right upper leg, Left upper leg   Body parts bathed by helper: Buttocks, Left lower leg, Right lower leg Body parts n/a: Front perineal area, Buttocks, Right upper leg, Left upper leg, Right lower leg   Bathing assist Assist Level: Moderate Assistance - Patient 50 - 74%     Upper Body Dressing/Undressing Upper body dressing Upper body dressing/undressing activity did not occur (including orthotics): N/A What is the patient wearing?: Bra, Pull over shirt    Upper body assist Assist Level: Set up assist    Lower Body Dressing/Undressing Lower body dressing      What is the patient wearing?: Pants, Underwear/pull up     Lower body assist Assist for lower body dressing: Moderate Assistance - Patient 50 - 74%     Toileting Toileting    Toileting assist Assist for toileting: Minimal Assistance - Patient > 75% Assistive Device Comment: (walker)   Transfers Chair/bed transfer  Transfers assist     Chair/bed transfer assist level: Supervision/Verbal cueing Chair/bed transfer assistive device: Programmer, multimedia   Ambulation assist      Assist level: Supervision/Verbal cueing Assistive device: Walker-rolling Max distance: 150'   Walk 10 feet activity   Assist     Assist level: Supervision/Verbal cueing Assistive device: Walker-rolling   Walk 50 feet activity   Assist    Assist  level: Supervision/Verbal cueing Assistive device: Walker-rolling    Walk 150 feet activity   Assist    Assist level: Supervision/Verbal cueing Assistive device: Walker-rolling    Walk 10 feet on uneven surface  activity   Assist     Assist level:  Minimal Assistance - Patient > 75% Assistive device: Aeronautical engineer Will patient use wheelchair at discharge?: No             Wheelchair 50 feet with 2 turns activity    Assist            Wheelchair 150 feet activity     Assist          Blood pressure (!) 96/52, pulse 74, temperature 98.6 F (37 C), temperature source Oral, resp. rate 18, height 5\' 8"  (1.727 m), weight 114 kg, SpO2 93 %.  Medical Problem List and Plan: 1.  Decreased functional mobility secondary to spinal stenosis S/P recent L2-L3 fusion 05/30/2019.  Decadron taper per neurosurgery             -patient may shower after covering incision             -ELOS/Goals: 8-13 days/supervision/min a            -Continue CIR therapies 2.  Antithrombotics: -DVT/anticoagulation: SCDs.               Vascular study ordered             -antiplatelet therapy: N/A 3. Pain Management: Voltaren gel 4 times daily, Cymbalta 60 mg daily, Robaxin-750 mig 3 times daily, MS Contin 15 mg every 12 hours, oxycodone as needed for breakthrough pain  3/13- increase Robaxin up to 1000 mg TID for spasms since they are biggest issue  3/14- improved muscle spasms ,but somewhat better- con't meds  3/15: Added Tizanidine 4mg  q6H prn for severe spasms. Asked nurse Ed to give one dose this morning.   3/16: Spasms much improved with this regimen.   3/17: Spasms are better but continue to limit therapy; will add Skelaxin during the day to avoid oversedation, advised Mrs. Popma to ask for Tizanidine at night if she wakes up from spasms that prevent her from returning to sleep.              Monitor with increased exertion 4. Mood: Provide emotional support             -antipsychotic agents: N/A 5. Neuropsych: This patient is capable of making decisions on her own behalf. 6. Skin/Wound Care: Routine skin checks 7. Fluids/Electrolytes/Nutrition: Routine in and outs.  CMP ordered 8.  Acute on chronic anemia.   CBC ordered 9.  AKI on CKD stage III.  CMP ordered  3/13- Last Cr 1.25- up from 1.1- BUN 41- encourage fluids  3/14- pt admits to drinking ~ 1000cc/day- explains need to drink 2L/day minimim- said never had kidney issues 10.  Hypertension.  Toprol-XL 12.5 mg daily.               Monitor with increased mobility  3/14- BP well controlled- con't regimen.  11.  Obesity.  BMI 38.67.  Dietary follow-up 12.  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  Follow-up outpatient Dr. Jana Hakim.  Continue Armidex 13.  Hyperlipidemia.  Lipitor 14.  History of gout.  Continue Zyloprim. 15.  Hypothyroidism.  TSH 1.420.  Continue Synthroid 16.  Chronic constipation.  Adjust bowel meds as necessary  3/14-  on miralax BID- see if can have BM- could be cause of LLQ pain.   3/15: Increase Colace to TID. Refuses enema or mag citrate at this time.   3/16: Added senna HS.   3/17: Still no BM since 3/12. She is willing to take a suppository today.  3/18: see below.  17.  COPD/quit smoking 13 months ago.  Check oxygen saturations every shift  3/18: Apparently she has been sneaking cigarettes to smoke during the day. Will discuss further with patient and counsel.  18.  Tremors with restless leg syndrome.  Continue Requip as well as Mysoline  19. Hyperkalemia  3/13- Last K+ 5.1- will recheck Monday.   3/15: stable at 4.6 20. LLQ pain- is near groin, but still in abdomen- biggest source of pain except back spasms. -If not improved with BM, suggest U/S or CT.   3/15: KUB to assess for kidney stone vs. Constipation  3/16: KUB reviewed and discussed with patient: shows mild ileus and gas. Patient has multiple risk factors for ileus, including impaired mobility, obesity, hyperkalemia, hypothyroidism. She is already not eating much. Prescribed Mylanta for gas.   3/17: She has not requested Mylanta; will schedule  3/18: Had BM and is passing gas better now.  21. Fatigue/pallor: Appreciate NSGY follow-up.  Infection workup ordered and pending. Appears more fatigued than yesterday but also in less pain and better able to tolerate therapy; will maintain current muscle relaxers at this time but monitor for sedation.   74. Disposition: I have left voicemail with daughter today to discuss Mrs. Mataya's care.   LOS: 6 days A FACE TO FACE EVALUATION WAS PERFORMED  Martha Clan P Lyfe Reihl 07/04/2019, 10:28 AM

## 2019-07-04 NOTE — Progress Notes (Signed)
Physical Therapy Session Note  Patient Details  Name: Brandi Stephens MRN: [id number] Date of Birth: March 05, 1946  Today's Date: 07/04/2019 PT Individual Time: [id number] and [id number] PT Individual Time Calculation (min): 75 min and 60 min  Short Term Goals: Week 1:  PT Short Term Goal 1 (Week 1): = to LTGs based on ELOS  Skilled Therapeutic Interventions/Progress Updates:     Session 1: Pt received in supine and agreeable to therapy. Verbalizes feeling increased fatigue today and has difficulty staying awake during session, falling asleep several times, sometimes in mid conversation. Blood pressure taken in supine at 91/47. PA present and aware of drowsiness and hypotension.   Pt performs hip external/internal rotation in hooklying position, incorporating breathing pattern for increased core activation. x3 sets of x15 reps. First set without resistance and following sets with orange theraband around distal thighs. Pt also perform x2 sets of x10 reps of supine bridges. Unable to completely clear buttocks. Occasional exacerbation of "spasming" back pain with increased time required for pain management.   Session 2: Pt received in supine and has improved level of alertness from AM session. MinA for supine>sit with cues for logrolling technique to maintain spinal precautions. BP taken in sitting at 92/49. Pt attempts ambulation with rollator x2 bouts of x80' with CGA. Pt feels increased fear of falling with rollator and requests return to RW. Pt performs ambulation with RW x150', 220', 220', and 60'. Pt verbalizes feeling SOB during ambulation and O2 sats taken immediately following bout of 220' (86% with HR of 118).  Pt transfers to mat table and into high kneeling position. Unable to tolerate secondary to feeling increased nausea and feeling like she will "pass out". W/C used to transport pt back to room.   Increased time required during session for management of back pain and consistent cues for  breathing technique and body mechanics to limit pain exacerbation.   Precautions:  Precautions Precautions: Back, Fall Precaution Comments: verbally reviewed back precautions.  Restrictions Weight Bearing Restrictions: No    Therapy/Group: Individual Therapy  Breck Coons PT, DPT 07/04/2019, 3:54 PM

## 2019-07-04 NOTE — Progress Notes (Addendum)
  NEUROSURGERY PROGRESS NOTE   Patient seen for a social visit today. She appears much more fatigued today with pale appearance. Reports fatigue started 2 days ago. Has difficulties staying awake and participating in therapy due to the fatigue. Denies new/worsening back pain. Muscle spasms okay. No new N/T/W. Endorses shortness of breath at times. No chest pain. No shortness of breath now.  Exam: Blood pressure (!) 96/52, pulse 74, temperature 98.6 F (37 C), temperature source Oral, resp. rate 18, height 5\' 8"  (1.727 m), weight 114 kg, SpO2 93 %. Pale in appearance, sleepy but awakens to participate in discussion Moves all extremities well, non-focal. CN grossly intact. Incision: c/d/i, no sign of infection. No palpable tenderness along lumbar spine and paraspinal muscles  Assessment/Plan - Fatigue: In review of chart, there have been multiple changes to her muscle relaxers including addition of muscle relaxers, increased doses, etc over the past two days. She is afebrile. Question if this is all medication induced. Nonetheless, believe it is necessary to work this up further. CBC, CMP, UA with culture, CXR. Will defer to primary team for medication management.  Addendum Labs reviewed and notable for drop in H/H, AKI. Discussed with Dr Adam Phenix who consulted Jerauld. FOBT added.

## 2019-07-04 NOTE — Progress Notes (Signed)
Team Conference Report to Patient/Family  Team Conference discussion was reviewed with the patient, including goals, any changes in plan of care and target discharge date.  Patient  expressed understanding and is in agreement.  The patient has a target discharge date of 07/10/19. With supervision goals and HH follow up.  Brandi Stephens B 07/04/2019, 4:18 PM

## 2019-07-04 NOTE — Consult Note (Addendum)
Medical Consultation   [name]  [id number]  DOB: November 23, 1945  DOA: 06/28/2019  PCP: Girtha Rm, NP-C   Requesting physician: Dr. Martha Clan  Reason for consultation: Worsening kidney function, anemia, UTI?  History of Present Illness: Brandi Stephens is an 74 y.o. female with past medical history of hypertension, left breast cancer status post lumpectomy and radiation therapy, hypothyroidism, COPD, former smoker, morbid obesity with BMI of 38, restless leg syndrome, chronic constipation, CKD stage III, lumbar spinal stenosis status post decompression and fusion on 05/30/2019 per Dr. Kathyrn Sheriff admitted with low back pain and radiation and weakness in left leg on 06/25/2019 and subsequently admitted for comprehensive rehab program on 06/28/2019.  Patient was evaluated by neurosurgery this morning and felt that patient is more pale and fatigued and ordered infectious work-up.  Hospitalist consulted for concern of worsening kidney function, drop in H&H and concern for UTI.  She has mild ileus which is noted on abdominal x-ray however patient had regular bowel movement and has been passing gas.  Patient tells me that she is getting better every day, still has painful muscular spasm and nausea and vomiting.  She tells me that she has left flank pain since 1 week and foul-smelling urine which she noticed this morning.  Denies dysuria, hematuria, fever, chills, cough, congestion, runny nose, swelling or redness in bilateral lower extremities, decreased appetite, melena, hematemesis.   She is working very well with physical therapy.  She is currently not on chemical anticoagulation for DVT prophylaxis. She lives with her spouse at home.  Denies smoking, illicit drug use however drinks alcohol 5 times per week.    Past Medical History:  Diagnosis Date  . AIN (anal intraepithelial neoplasia) anal canal   . Anemia    with pregnancy  . Anxiety   . Arthritis    knees,  lower back, knees  . Chronic constipation   . CKD (chronic kidney disease)   . Coarse tremors    Essential  . COPD (chronic obstructive pulmonary disease) ([place])    2017 chest CT, history of  . Degenerative lumbar spinal stenosis   . Depression   . Drug dependency ([place])   . Elevated PTHrP level 05/09/2019  . GERD (gastroesophageal reflux disease)    pt denies  . Gout   . Grade I diastolic dysfunction 0000000   Noted ECHO  . Hemorrhoids   . History of adenomatous polyp of colon   . History of basal cell carcinoma (BCC) excision 2015   nose  . History of sepsis 05/14/2014   post lumbar surgery  . History of shingles    x2  . Hyperlipidemia   . Hypertension   . Hypothyroidism   . Irregular heart beat    history of while undergoing radiation  . Malignant neoplasm of upper-inner quadrant of left breast in female, estrogen receptor positive [facility]) oncologist-  dr Jana Hakim--  per lov in epic no recurrence   dx 07-13-2015  Left breast invasive ductal carcinoma, Stage IA, Grade 1 (TXN0),  09-16-2015  s/p  left breast lumpectomy with sln bx's,  completed radiation 11-18-2015,  started antiestrogen therapy 12-14-2015  . Neuropathy    Left foot and back of left leg  . Personal history of radiation therapy    completed 11-18-2015  left breast  . Pneumonia   . PONV (postoperative nausea and vomiting)   . Prediabetes    diet  controlled, no med  . Restless leg syndrome   . Seasonal allergies   . Seizure ([place]) 05/2015   due to sepsis, only one time episode  . Tremor   . Wears partial dentures    lower          Review of Systems:  ROS As per HPI otherwise 10 point review of systems negative.     Past Medical History: Past Medical History:  Diagnosis Date  . AIN (anal intraepithelial neoplasia) anal canal   . Anemia    with pregnancy  . Anxiety   . Arthritis    knees, lower back, knees  . Chronic constipation   . CKD (chronic kidney disease)   . Coarse tremors     Essential  . COPD (chronic obstructive pulmonary disease) ([place])    2017 chest CT, history of  . Degenerative lumbar spinal stenosis   . Depression   . Drug dependency ([place])   . Elevated PTHrP level 05/09/2019  . GERD (gastroesophageal reflux disease)    pt denies  . Gout   . Grade I diastolic dysfunction 0000000   Noted ECHO  . Hemorrhoids   . History of adenomatous polyp of colon   . History of basal cell carcinoma (BCC) excision 2015   nose  . History of sepsis 05/14/2014   post lumbar surgery  . History of shingles    x2  . Hyperlipidemia   . Hypertension   . Hypothyroidism   . Irregular heart beat    history of while undergoing radiation  . Malignant neoplasm of upper-inner quadrant of left breast in female, estrogen receptor positive [facility]) oncologist-  dr Jana Hakim--  per lov in epic no recurrence   dx 07-13-2015  Left breast invasive ductal carcinoma, Stage IA, Grade 1 (TXN0),  09-16-2015  s/p  left breast lumpectomy with sln bx's,  completed radiation 11-18-2015,  started antiestrogen therapy 12-14-2015  . Neuropathy    Left foot and back of left leg  . Personal history of radiation therapy    completed 11-18-2015  left breast  . Pneumonia   . PONV (postoperative nausea and vomiting)   . Prediabetes    diet controlled, no med  . Restless leg syndrome   . Seasonal allergies   . Seizure ([place]) 05/2015   due to sepsis, only one time episode  . Tremor   . Wears partial dentures    lower     Past Surgical History: Past Surgical History:  Procedure Laterality Date  . ABDOMINAL HYSTERECTOMY  1985  . BASAL CELL CARCINOMA EXCISION  10/15   nose  . BREAST BIOPSY Right 08/24/2015  . BREAST BIOPSY Right 07/13/2015  . BREAST BIOPSY Left 07/13/2015  . BREAST EXCISIONAL BIOPSY Left 1972  . BREAST LUMPECTOMY Left   . BREAST LUMPECTOMY WITH RADIOACTIVE SEED AND SENTINEL LYMPH NODE BIOPSY Left 09/16/2015   Procedure: BREAST LUMPECTOMY WITH RADIOACTIVE SEED AND SENTINEL  LYMPH NODE BIOPSY;  Surgeon: Stark Klein, MD;  Location: [facility];  Service: General;  Laterality: Left;  . BREAST REDUCTION SURGERY Bilateral 1998  . CATARACT EXTRACTION W/ INTRAOCULAR LENS IMPLANT Left 2016  . [place]   hallo  . COLONOSCOPY  01/2013   polyps  . EYE SURGERY Left 2016  . HAND SURGERY Left 02-19-2002   dr Fredna Dow  @[facility]    repair collateral ligament/ MPJ left thumb  . [place]  2008  . HIGH RESOLUTION ANOSCOPY N/A 02/28/2018   Procedure:  HIGH RESOLUTION ANOSCOPY WITH BIOPSY;  Surgeon: Leighton Ruff, MD;  Location: [facility];  Service: General;  Laterality: N/A;  . KNEE ARTHROSCOPY Right 2002  . LUMBAR SPINE SURGERY  03-20-2015   dr Kathyrn Sheriff   fusion L4-5, L5-S1  . MOUTH SURGERY  07/14/2018   2 infected teeth removed  . MULTIPLE TOOTH EXTRACTIONS     with bone grafting lower bottom right and left  . REDUCTION MAMMAPLASTY Bilateral   . RIGHT COLECTOMY  09-10-2003   dr Margot Chimes @[facility]    multiple colon polyps (per path tubular adenoma's, hyperplastic , benign appendix, benign two lymph nodes  . ROTATOR CUFF REPAIR Left 04/2016  . TONSILLECTOMY  1969  . TUBAL LIGATION Bilateral 1982     Allergies:   Allergies  Allergen Reactions  . Ampicillin Hives and Other (See Comments)    Severe reaction in February 2017 1.5 month to have hives to go away  . Penicillins Hives and Other (See Comments)    Severe reaction in February 2017 1.5 month to have hives to go away Has patient had a PCN reaction causing immediate rash, facial/tongue/throat swelling, SOB or lightheadedness with hypotension: #  #  #  YES  #  #  #  Has patient had a PCN reaction causing severe rash involving mucus membranes or skin necrosis: No Has patient had a PCN reaction that required hospitalization No Has patient had a PCN reaction occurring within the last 10 years: No   . Gabapentin Swelling    UNSPECIFIED REACTION   . Other Itching     UNSPECIFIED Analgesics  . Codeine Nausea And Vomiting  . Hydrocodone-Acetaminophen Itching  . Hydromorphone Itching    Pt has taken w/o issues     Social History:  reports that she quit smoking about 13 months ago. Her smoking use included cigarettes. She has a 17.50 pack-year smoking history. She has never used smokeless tobacco. She reports current alcohol use. She reports that she does not use drugs.   Family History: Family History  Problem Relation Age of Onset  . Atrial fibrillation Mother   . Ulcers Father   . Breast cancer Maternal Aunt   . Lung cancer Maternal Uncle   . Stomach cancer Maternal Grandmother   . Lung cancer Maternal Aunt   . Brain cancer Maternal Aunt   . Prostate cancer Maternal Grandfather   . Stroke Paternal Grandmother   . Tuberculosis Paternal Grandfather   . Colon cancer Neg Hx   . Esophageal cancer Neg Hx   . Rectal cancer Neg Hx       Physical Exam: Vitals:   07/03/19 1302 07/03/19 2032 07/04/19 0624 07/04/19 0641  BP: (!) 99/52 (!) 151/60 (!) 94/45 (!) 96/52  Pulse: 77 91 74   Resp: 19 18 18    Temp: 98.1 F (36.7 C) 99.3 F (37.4 C) 98.6 F (37 C)   TempSrc:  Oral Oral   SpO2: 92% 90% 93%   Weight:      Height:        Constitutional: Appearance,  Alert and awake, oriented x3, not in any acute distress.  Communicating well Eyes: PERLA, EOMI, irises appear normal, anicteric sclera,  ENMT: external ears and nose appear normal, normal hearing or hard of hearing            Lips appears normal, oropharynx mucosa, tongue, posterior pharynx appear normal  Neck: neck appears normal, no masses, normal ROM, no thyromegaly, no JVD  CVS: S1-S2 clear, no  murmur rubs or gallops, no LE edema, normal pedal pulses  Respiratory:  clear to auscultation bilaterally, no wheezing, rales or rhonchi. Respiratory effort normal. No accessory muscle use.  Abdomen: soft nontender, nondistended, normal bowel sounds, no hepatosplenomegaly, no hernias    Musculoskeletal: : no cyanosis, clubbing or edema noted bilaterally                       Joint/bones/muscle exam, strength, contractures or atrophy Neuro: Cranial nerves II-XII intact, strength, sensation, reflexes Psych: judgement and insight appear normal, stable mood and affect, mental status Skin: no rashes or lesions or ulcers, no induration or nodules    Data reviewed:  I have personally reviewed following labs and imaging studies Labs:  CBC: Recent Labs  Lab 07/01/19 0508 07/04/19 1008  WBC 10.9* 11.4*  NEUTROABS 7.2 8.3*  HGB 10.2* 8.2*  HCT 32.5* 25.8*  MCV 100.0 97.4  PLT 444* 411*    Basic Metabolic Panel: Recent Labs  Lab 06/28/19 0146 06/28/19 0146 07/01/19 0508 07/04/19 1008  NA 135  --  138 130*  K 5.1   < > 4.6 4.9  CL 100  --  101 98  CO2 23  --  25 22  GLUCOSE 227*  --  177* 193*  BUN 41*  --  26* 36*  CREATININE 1.25*  --  1.05* 1.76*  CALCIUM 9.7  --  9.8 9.1   < > = values in this interval not displayed.   GFR Estimated Creatinine Clearance: 38.3 mL/min (A) (by C-G formula based on SCr of 1.76 mg/dL (H)). Liver Function Tests: Recent Labs  Lab 07/01/19 0508 07/04/19 1008  AST 17 25  ALT 27 31  ALKPHOS 92 92  BILITOT 0.6 0.6  PROT 6.0* 5.7*  ALBUMIN 2.8* 2.5*   No results for input(s): LIPASE, AMYLASE in the last 168 hours. No results for input(s): AMMONIA in the last 168 hours. Coagulation profile No results for input(s): INR, PROTIME in the last 168 hours.  Cardiac Enzymes: No results for input(s): CKTOTAL, CKMB, CKMBINDEX, TROPONINI in the last 168 hours. BNP: Invalid input(s): POCBNP CBG: No results for input(s): GLUCAP in the last 168 hours. D-Dimer No results for input(s): DDIMER in the last 72 hours. Hgb A1c No results for input(s): HGBA1C in the last 72 hours. Lipid Profile No results for input(s): CHOL, HDL, LDLCALC, TRIG, CHOLHDL, LDLDIRECT in the last 72 hours. Thyroid function studies No results for input(s):  TSH, T4TOTAL, T3FREE, THYROIDAB in the last 72 hours.  Invalid input(s): FREET3 Anemia work up No results for input(s): VITAMINB12, FOLATE, FERRITIN, TIBC, IRON, RETICCTPCT in the last 72 hours. Urinalysis    Component Value Date/Time   COLORURINE AMBER (A) 07/04/2019 1300   APPEARANCEUR CLOUDY (A) 07/04/2019 1300   LABSPEC 1.026 07/04/2019 1300   LABSPEC 1.025 06/24/2019 1426   PHURINE 5.0 07/04/2019 1300   GLUCOSEU NEGATIVE 07/04/2019 1300   HGBUR NEGATIVE 07/04/2019 1300   BILIRUBINUR MODERATE (A) 07/04/2019 1300   BILIRUBINUR moderate (A) 06/24/2019 1426   BILIRUBINUR n 11/25/2015 1712   KETONESUR NEGATIVE 07/04/2019 1300   PROTEINUR NEGATIVE 07/04/2019 1300   UROBILINOGEN negative 11/25/2015 1712   NITRITE NEGATIVE 07/04/2019 1300   LEUKOCYTESUR MODERATE (A) 07/04/2019 1300     Microbiology Recent Results (from the past 240 hour(s))  Urine Culture     Status: None   Collection Time: 06/24/19  2:59 PM   Specimen: Urine   UR  Result Value Ref Range Status  Urine Culture, Routine Final report  Final   Organism ID, Bacteria Comment  Final    Comment: Mixed urogenital flora 25,000-50,000 colony forming units per mL   SARS CORONAVIRUS 2 (TAT 6-24 HRS) Nasopharyngeal Nasopharyngeal Swab     Status: None   Collection Time: 06/24/19 10:57 PM   Specimen: Nasopharyngeal Swab  Result Value Ref Range Status   SARS Coronavirus 2 NEGATIVE NEGATIVE Final    Comment: (NOTE) SARS-CoV-2 target nucleic acids are NOT DETECTED. The SARS-CoV-2 RNA is generally detectable in upper and lower respiratory specimens during the acute phase of infection. Negative results do not preclude SARS-CoV-2 infection, do not rule out co-infections with other pathogens, and should not be used as the sole basis for treatment or other patient management decisions. Negative results must be combined with clinical observations, patient history, and epidemiological information. The expected result is  Negative. Fact Sheet for Patients: SugarRoll.be Fact Sheet for Healthcare Providers: https://www.woods-mathews.com/ This test is not yet approved or cleared by the [place] FDA and  has been authorized for detection and/or diagnosis of SARS-CoV-2 by FDA under an Emergency Use Authorization (EUA). This EUA will remain  in effect (meaning this test can be used) for the duration of the COVID-19 declaration under Section 56 4(b)(1) of the Act, 21 U.S.C. section 360bbb-3(b)(1), unless the authorization is terminated or revoked sooner. Performed at [facility], [place] [address] [place], [place] [address]   Blood culture (routine x 2)     Status: Abnormal   Collection Time: 06/25/19  4:15 AM   Specimen: BLOOD RIGHT FOREARM  Result Value Ref Range Status   Specimen Description BLOOD RIGHT FOREARM  Final   Special Requests   Final    BOTTLES DRAWN AEROBIC AND ANAEROBIC Blood Culture adequate volume   Culture  Setup Time   Final    IN BOTH AEROBIC AND ANAEROBIC BOTTLES GRAM POSITIVE COCCI IN CLUSTERS CRITICAL RESULT CALLED TO, READ BACK BY AND VERIFIED WITH: L SEAY PHARMD 06/26/19 0003 JDW    Culture (A)  Final    STAPHYLOCOCCUS SPECIES (COAGULASE NEGATIVE) THE SIGNIFICANCE OF ISOLATING THIS ORGANISM FROM A SINGLE SET OF BLOOD CULTURES WHEN MULTIPLE SETS ARE DRAWN IS UNCERTAIN. PLEASE NOTIFY THE MICROBIOLOGY DEPARTMENT WITHIN ONE WEEK IF SPECIATION AND SENSITIVITIES ARE REQUIRED. Performed at [facility], [place] [address] [place], [place] [address]    Report Status 06/27/2019 FINAL  Final  Blood culture (routine x 2)     Status: None   Collection Time: 06/25/19  4:18 AM   Specimen: BLOOD  Result Value Ref Range Status   Specimen Description BLOOD LEFT ANTECUBITAL  Final   Special Requests   Final    BOTTLES DRAWN AEROBIC AND ANAEROBIC Blood Culture adequate volume   Culture   Final    NO GROWTH 5 DAYS Performed at [facility], [place] [address], [place], [place] [address]    Report Status 06/30/2019 FINAL  Final       Inpatient Medications:   Scheduled Meds: . allopurinol  300 mg Oral Daily  . alum & mag hydroxide-simeth  15 mL Oral TID  . anastrozole  1 mg Oral Daily  . atorvastatin  20 mg Oral q1800  . diclofenac Sodium  4 g Topical QID  . docusate sodium  100 mg Oral TID  . DULoxetine  60 mg Oral Daily  . levothyroxine  75 mcg Oral Q0600  . magnesium gluconate  1,000 mg Oral QHS  .  Melatonin  10.5 mg Oral QHS  . metaxalone  800 mg Oral TID  . methocarbamol  1,000 mg Oral TID  . metoprolol succinate  12.5 mg Oral Daily  . morphine  15 mg Oral Q12H  . polyethylene glycol  17 g Oral BID  . primidone  50 mg Oral QHS  . rOPINIRole  0.75 mg Oral QHS  . senna  2 tablet Oral QHS  . vitamin B-12  1,000 mcg Oral Daily   Continuous Infusions: . sodium chloride       Radiological Exams on Admission: DG Chest 2 View  Result Date: 07/04/2019 CLINICAL DATA:  Fatigue.  Back pain and spasms EXAM: CHEST - 2 VIEW COMPARISON:  03/01/2019 FINDINGS: Mild cardiomegaly. Negative aortic and hilar contours when accounting for rightward rotation. Postoperative left breast and axilla. There is no edema, consolidation, effusion, or pneumothorax. IMPRESSION: Cardiomegaly without failure. Electronically Signed   By: Monte Fantasia M.D.   On: 07/04/2019 10:37    Impression/Recommendations Principal Problem:   Spinal stenosis of lumbar region Active Problems:   Mixed hyperlipidemia   Essential hypertension   Hypothyroidism   Acute on chronic anemia   AKI (acute kidney injury) ([place])   Acute on chronic kidney injury stage IIIb: -Likely secondary to dehydration due to nausea and vomiting and decreased p.o. intake -Start on gentle hydration.  Advised patient to increase p.o. intake.  Avoid nephrotoxic medication. -Hold Cymbalta as GFR is less than 30.  Renal ultrasound is ordered and is pending. -Repeat BMP  tomorrow a.m.  UTI: -Patient reports foul-smelling urine and left flank pain. -UA is positive for leukocytes and bacteria -Patient is afebrile with leukocytosis of 11,000. -Patient is allergic to penicillin-started patient on Cipro 250 mg p.o. twice daily -Urine culture and blood culture is pending  Hypertension: Patient is hypotensive -We will hold beta-blocker for now and monitor blood pressure closely.  Continue IV fluids.  Mild ileus: Improving -Reviewed x-ray abdomen. -Patient had regular bowel movement and has been passing gas. -Encouraged ambulation  Hypothyroidism: TSH: WNL checked on 03/18/2019 -Continue levothyroxine  Acute on chronic anemia: -Patient denies melena.  Stool occult is ordered and is pending. -H&H: 8.2/25.8 (baseline H&H is 9.1/28.8) -Check iron studies -Monitor H&H closely and transfuse as needed.  Hyponatremia: -Sodium of 130.  Reviewed home meds -she is not on diuretics.   -Continue IV fluids and monitor sodium levels closely.  Painful muscle spasm: -Continue Robaxin, metaxalone  Restless leg/tremors: Continue primidone and Requip  Thrombocytosis: Platelets: 411 -Likely reactive.  Continue to monitor  Hyperlipidemia: Continue statin  History of left breast cancer: -Status post mastectomy and radiation therapy -Continue Arimidex  Gout: Stable, hold allopurinol  Spinal stenosis of lumbar spine region: -Status post recent L2-L3 fusion on 05/30/2019 -Neurosurgery and PT is following.  Thank you for this consultation.  Our [facility] hospitalist team will follow the patient with you.   Time Spent: 30 minutes  Mckinley Jewel M.D. [facility] 07/04/2019, 1:38 PM

## 2019-07-04 NOTE — Progress Notes (Signed)
Occupational Therapy Note  Patient Details  Name: Brandi Stephens MRN: [id number] Date of Birth: [birth year]  Today's Date: 07/04/2019 OT Missed Time: 43 Minutes Missed Time Reason: Patient fatigue  Pt missed 60 min skilled OT d/t fatigue and unable to arouse enough for safe mobility. Pt daughter present reporting not sleeping overnight, increased confusion and pt needing rest. Pt audibly snoring even through repositioning BUEs onto bed (1 UE off EOB, 1 UE tucked under back). Will follow up for make up time as able per POC.   Brandi Stephens 07/04/2019, 11:17 AM

## 2019-07-04 NOTE — Progress Notes (Signed)
Occupational Therapy Session Note  Patient Details  Name: Brandi Stephens MRN: [id number] Date of Birth: March 24, 1946  Today's Date: 07/04/2019 OT Individual Time: 1530-1540 OT Individual Time Calculation (min): 10 min    Short Term Goals: Week 1:  OT Short Term Goal 1 (Week 1): STG = LTG  Skilled Therapeutic Interventions/Progress Updates:    Attempted to see patient for make up time. Upon entering room, pt able requesting emesis bag and dry heaving. IV team RN entered. Provided CGA and VC for hand placement on bed rail for stand pivot transfer back to bed. A provided for 1LE back to bed, exited session with IV RN present and all need smet  Therapy Documentation Precautions:  Precautions Precautions: Back, Fall Precaution Comments: verbally reviewed back precautions.  Restrictions Weight Bearing Restrictions: No General:   Vital Signs: Therapy Vitals Temp: 99.2 F (37.3 C) Temp Source: Oral Pulse Rate: 65 Resp: 18 BP: (!) 76/37 Patient Position (if appropriate): Lying Oxygen Therapy SpO2: 94 % O2 Device: Room Air Pain:   ADL: ADL Eating: Set up Grooming: Setup Where Assessed-Grooming: Sitting at sink Upper Body Bathing: Setup Where Assessed-Upper Body Bathing: Shower Lower Body Bathing: Moderate assistance Where Assessed-Lower Body Bathing: Shower Upper Body Dressing: Setup Where Assessed-Upper Body Dressing: Wheelchair Lower Body Dressing: Moderate assistance Where Assessed-Lower Body Dressing: Wheelchair Toileting: Minimal assistance Where Assessed-Toileting: Glass blower/designer: Therapist, music Method: Counselling psychologist: Energy manager: Curator Method: Heritage manager: Radio broadcast assistant ADL Comments: declined Marine scientist   Exercises:   Other Treatments:     Therapy/Group: Individual Therapy  Tonny Branch 07/04/2019, 3:52 PM

## 2019-07-05 ENCOUNTER — Encounter (HOSPITAL_COMMUNITY): Payer: Self-pay | Admitting: Physical Medicine & Rehabilitation

## 2019-07-05 ENCOUNTER — Inpatient Hospital Stay (HOSPITAL_COMMUNITY): Payer: PPO | Admitting: Occupational Therapy

## 2019-07-05 ENCOUNTER — Inpatient Hospital Stay (HOSPITAL_COMMUNITY): Payer: PPO | Admitting: Physical Therapy

## 2019-07-05 ENCOUNTER — Inpatient Hospital Stay (HOSPITAL_COMMUNITY): Payer: PPO

## 2019-07-05 DIAGNOSIS — E038 Other specified hypothyroidism: Secondary | ICD-10-CM

## 2019-07-05 DIAGNOSIS — F329 Major depressive disorder, single episode, unspecified: Secondary | ICD-10-CM

## 2019-07-05 DIAGNOSIS — F32A Depression, unspecified: Secondary | ICD-10-CM

## 2019-07-05 DIAGNOSIS — K567 Ileus, unspecified: Secondary | ICD-10-CM

## 2019-07-05 DIAGNOSIS — G2581 Restless legs syndrome: Secondary | ICD-10-CM

## 2019-07-05 DIAGNOSIS — E669 Obesity, unspecified: Secondary | ICD-10-CM

## 2019-07-05 DIAGNOSIS — I5032 Chronic diastolic (congestive) heart failure: Secondary | ICD-10-CM

## 2019-07-05 DIAGNOSIS — C50212 Malignant neoplasm of upper-inner quadrant of left female breast: Secondary | ICD-10-CM

## 2019-07-05 DIAGNOSIS — F419 Anxiety disorder, unspecified: Secondary | ICD-10-CM | POA: Diagnosis present

## 2019-07-05 DIAGNOSIS — F1010 Alcohol abuse, uncomplicated: Secondary | ICD-10-CM

## 2019-07-05 DIAGNOSIS — F320 Major depressive disorder, single episode, mild: Secondary | ICD-10-CM

## 2019-07-05 DIAGNOSIS — Z170 Estrogen receptor positive status [ER+]: Secondary | ICD-10-CM

## 2019-07-05 HISTORY — DX: Alcohol abuse, uncomplicated: F10.10

## 2019-07-05 HISTORY — DX: Chronic diastolic (congestive) heart failure: I50.32

## 2019-07-05 HISTORY — DX: Obesity, unspecified: E66.9

## 2019-07-05 HISTORY — DX: Depression, unspecified: F32.A

## 2019-07-05 HISTORY — DX: Major depressive disorder, single episode, unspecified: F32.9

## 2019-07-05 LAB — CBC WITH DIFFERENTIAL/PLATELET
Abs Immature Granulocytes: 0.2 10*3/uL — ABNORMAL HIGH (ref 0.00–0.07)
Basophils Absolute: 0.1 10*3/uL (ref 0.0–0.1)
Basophils Relative: 1 %
Eosinophils Absolute: 0.3 10*3/uL (ref 0.0–0.5)
Eosinophils Relative: 3 %
HCT: 25.6 % — ABNORMAL LOW (ref 36.0–46.0)
Hemoglobin: 8 g/dL — ABNORMAL LOW (ref 12.0–15.0)
Immature Granulocytes: 2 %
Lymphocytes Relative: 15 %
Lymphs Abs: 1.5 10*3/uL (ref 0.7–4.0)
MCH: 31 pg (ref 26.0–34.0)
MCHC: 31.3 g/dL (ref 30.0–36.0)
MCV: 99.2 fL (ref 80.0–100.0)
Monocytes Absolute: 1.1 10*3/uL — ABNORMAL HIGH (ref 0.1–1.0)
Monocytes Relative: 11 %
Neutro Abs: 6.8 10*3/uL (ref 1.7–7.7)
Neutrophils Relative %: 68 %
Platelets: 380 10*3/uL (ref 150–400)
RBC: 2.58 MIL/uL — ABNORMAL LOW (ref 3.87–5.11)
RDW: 13.9 % (ref 11.5–15.5)
WBC: 9.9 10*3/uL (ref 4.0–10.5)
nRBC: 0 % (ref 0.0–0.2)

## 2019-07-05 LAB — COMPREHENSIVE METABOLIC PANEL
ALT: 32 U/L (ref 0–44)
AST: 26 U/L (ref 15–41)
Albumin: 2.4 g/dL — ABNORMAL LOW (ref 3.5–5.0)
Alkaline Phosphatase: 86 U/L (ref 38–126)
Anion gap: 12 (ref 5–15)
BUN: 34 mg/dL — ABNORMAL HIGH (ref 8–23)
CO2: 20 mmol/L — ABNORMAL LOW (ref 22–32)
Calcium: 8.9 mg/dL (ref 8.9–10.3)
Chloride: 99 mmol/L (ref 98–111)
Creatinine, Ser: 1.64 mg/dL — ABNORMAL HIGH (ref 0.44–1.00)
GFR calc Af Amer: 36 mL/min — ABNORMAL LOW (ref >60)
GFR calc non Af Amer: 31 mL/min — ABNORMAL LOW (ref >60)
Glucose, Bld: 175 mg/dL — ABNORMAL HIGH (ref 70–99)
Potassium: 4.1 mmol/L (ref 3.5–5.1)
Sodium: 131 mmol/L — ABNORMAL LOW (ref 135–145)
Total Bilirubin: 0.5 mg/dL (ref 0.3–1.2)
Total Protein: 5.5 g/dL — ABNORMAL LOW (ref 6.5–8.1)

## 2019-07-05 LAB — FOLATE RBC
Folate, Hemolysate: 388 ng/mL
Folate, RBC: 1902 ng/mL (ref >498)
Hematocrit: 20.4 % — ABNORMAL LOW (ref 34.0–46.6)

## 2019-07-05 LAB — MAGNESIUM: Magnesium: 2.5 mg/dL — ABNORMAL HIGH (ref 1.7–2.4)

## 2019-07-05 LAB — PHOSPHORUS: Phosphorus: 4 mg/dL (ref 2.5–4.6)

## 2019-07-05 LAB — URINE CULTURE

## 2019-07-05 MED ORDER — QUETIAPINE FUMARATE 25 MG PO TABS
25.0000 mg | ORAL_TABLET | Freq: Every day | ORAL | Status: DC
  Administered 2019-07-05 – 2019-07-10 (×6): 25 mg via ORAL
  Filled 2019-07-05 (×6): qty 1

## 2019-07-05 MED ORDER — ALLOPURINOL 300 MG PO TABS
300.0000 mg | ORAL_TABLET | Freq: Every day | ORAL | Status: DC
  Administered 2019-07-05 – 2019-07-11 (×7): 300 mg via ORAL
  Filled 2019-07-05 (×8): qty 1

## 2019-07-05 MED ORDER — NYSTATIN 100000 UNIT/ML MT SUSP
5.0000 mL | Freq: Four times a day (QID) | OROMUCOSAL | Status: DC
  Administered 2019-07-05 – 2019-07-11 (×25): 500000 [IU] via ORAL
  Filled 2019-07-05 (×23): qty 5

## 2019-07-05 NOTE — Progress Notes (Signed)
Physical Therapy Session Note  Patient Details  Name: Brandi Stephens MRN: [id number] Date of Birth: December 13, 1945  Today's Date: 07/05/2019 PT Individual Time: 8616-8372 PT Individual Time Calculation (min): 57 min   Short Term Goals: Week 1:  PT Short Term Goal 1 (Week 1): = to LTGs based on ELOS  Skilled Therapeutic Interventions/Progress Updates:    Patient received in bed, reports she did well this morning but now has been having increased spasms and pain in her back, received pain medication not too long before PT session started. Required extra time for all mobility and session limited by pain this morning, cues for slow deep breathing for pain management. Able to perform bed mobility mostly with MinA but did need ModA with return to bed for BLE management to maintain back precautions, also able to consistently perform transfers with supervision and RW as well as gait 43fx2 with RW/S. Needed cues for functional application of back precautions as well as for safe use of RW with mobility. Education provided on breathing techniques to assist in reducing pain as well as importance of core strengthening to assist in reducing pain but time limited and unable to perform core exercises today due to time limitations of session. Declines up to chair and was left in bed with all needs met, bed alarm active and all needs otherwise met this morning.   Therapy Documentation Precautions:  Precautions Precautions: Back, Fall Precaution Comments: verbally reviewed back precautions.  Restrictions Weight Bearing Restrictions: No    Pain: Pain Assessment Pain Scale: Faces Pain Score: 4  Faces Pain Scale: Hurts even more Pain Type: Acute pain;Surgical pain Pain Location: Back Pain Orientation: Lower;Mid Pain Descriptors / Indicators: Aching;Sharp;Cramping;Spasm Pain Frequency: Intermittent Pain Onset: On-going Patients Stated Pain Goal: 0 Pain Intervention(s):  Repositioned;Rest;Relaxation Multiple Pain Sites: No    Therapy/Group: Individual Therapy   KWindell Norfolk DPT, PN1   Supplemental Physical Therapist [place]   Pager [phone] Rehab Office [phone]   07/05/2019, 12:20 PM

## 2019-07-05 NOTE — Progress Notes (Signed)
[place] PHYSICAL MEDICINE & REHABILITATION PROGRESS NOTE   Subjective/Complaints: No BM yesterday.  Appears much brighter this morning after receiving fluids and with Tizanidine d/ced.  Tolerated therapy very well this morning. Daughter is at bedside.   ROS:  Pt denies SOB, CP, and vision changes   Objective:   DG Chest 2 View  Result Date: 07/04/2019 CLINICAL DATA:  Fatigue.  Back pain and spasms EXAM: CHEST - 2 VIEW COMPARISON:  03/01/2019 FINDINGS: Mild cardiomegaly. Negative aortic and hilar contours when accounting for rightward rotation. Postoperative left breast and axilla. There is no edema, consolidation, effusion, or pneumothorax. IMPRESSION: Cardiomegaly without failure. Electronically Signed   By: Monte Fantasia M.D.   On: 07/04/2019 10:37   US RENAL  Result Date: 07/04/2019 CLINICAL DATA:  Pain at LEFT flank for 2 weeks, UTI EXAM: RENAL / URINARY TRACT ULTRASOUND COMPLETE COMPARISON:  None FINDINGS: Right Kidney: Renal measurements: 11.2 x 4.7 x 7.5 cm = volume: 206 mL. Mild cortical thinning. Normal cortical echogenicity. No mass, hydronephrosis, or shadowing calcification. Left Kidney: Renal measurements: 10.5 x 6.2 x 6.1 cm = volume: 208 mL. Mild cortical thinning. Normal cortical echogenicity. Small cyst at inferior pole 2.2 x 1.9 x 1.4 cm. No additional mass, hydronephrosis or shadowing calcification. Bladder: Appears normal for degree of bladder distention. Other: Echogenic hepatic parenchyma, likely reflecting fatty infiltration, without obvious nodularity or mass to suggest cirrhosis. IMPRESSION: LEFT renal cyst 2.2 cm greatest size. No other renal sonographic abnormalities identified. Suspected fatty infiltration of liver. Electronically Signed   By: Lavonia Dana M.D.   On: 07/04/2019 16:55   Recent Labs    07/04/19 1008 07/05/19 0428  WBC 11.4* 9.9  HGB 8.2* 8.0*  HCT 25.8* 25.6*  PLT 411* 380   Recent Labs    07/04/19 1008 07/05/19 0428  NA 130* 131*  K  4.9 4.1  CL 98 99  CO2 22 20*  GLUCOSE 193* 175*  BUN 36* 34*  CREATININE 1.76* 1.64*  CALCIUM 9.1 8.9    Intake/Output Summary (Last 24 hours) at 07/05/2019 1021 Last data filed at 07/05/2019 0945 Gross per 24 hour  Intake 410 ml  Output --  Net 410 ml     Physical Exam: Vital Signs Blood pressure 138/77, pulse 100, temperature 98.4 F (36.9 C), temperature source Oral, resp. rate 18, height 5\' 8"  (1.727 m), weight 114 kg, SpO2 92 %. Vitals reviewed. Constitutional: Better energy this morning.  HENT:  Head: conjugate gaze CV: RRR; no JVD  Respiratory: CTA B/L- good air movement GI: soft, NT, ND, (+)BS- TTP over LLQ near groin but still in abdomen- no rebound- nothing palpated per se.  Musculoskeletal:     Comments: No edema or tenderness in extremities  Neurological: Ox3 Cooperative with exam. Generalized tremor noted Motor: Bilateral upper extremities: 4-4+/5 proximal distal Bilateral lower extremities: 4-4+/5 proximal distal Sensation diminished to light touch distal extremities  Continuing to have spasms.  Skin:  Back incision well healing from lumbar to sacrum- no drainage or erythema Psychiatric: talkative; appropriate.    Assessment/Plan: 1. Functional deficits secondary to acute on chronic back pain S/P recent L2-L3 fusion 05/30/2019. which require 3+ hours per day of interdisciplinary therapy in a comprehensive inpatient rehab setting.  Physiatrist is providing close team supervision and 24 hour management of active medical problems listed below.  Physiatrist and rehab team continue to assess barriers to discharge/monitor patient progress toward functional and medical goals  Care Tool:  Bathing    Body parts  bathed by patient: Right arm, Left arm, Chest, Abdomen, Face, Front perineal area, Right upper leg, Left upper leg   Body parts bathed by helper: Buttocks, Left lower leg, Right lower leg Body parts n/a: Front perineal area, Buttocks, Right upper leg,  Left upper leg, Right lower leg   Bathing assist Assist Level: Moderate Assistance - Patient 50 - 74%     Upper Body Dressing/Undressing Upper body dressing Upper body dressing/undressing activity did not occur (including orthotics): N/A What is the patient wearing?: Bra, Pull over shirt    Upper body assist Assist Level: Set up assist    Lower Body Dressing/Undressing Lower body dressing      What is the patient wearing?: Pants, Underwear/pull up     Lower body assist Assist for lower body dressing: Moderate Assistance - Patient 50 - 74%     Toileting Toileting    Toileting assist Assist for toileting: Minimal Assistance - Patient > 75% Assistive Device Comment: (walker)   Transfers Chair/bed transfer  Transfers assist     Chair/bed transfer assist level: Supervision/Verbal cueing Chair/bed transfer assistive device: Programmer, multimedia   Ambulation assist      Assist level: Contact Guard/Touching assist Assistive device: Walker-rolling Max distance: 220'   Walk 10 feet activity   Assist     Assist level: Contact Guard/Touching assist Assistive device: Walker-rolling   Walk 50 feet activity   Assist    Assist level: Contact Guard/Touching assist Assistive device: Walker-rolling    Walk 150 feet activity   Assist    Assist level: Contact Guard/Touching assist Assistive device: Walker-rolling    Walk 10 feet on uneven surface  activity   Assist     Assist level: Minimal Assistance - Patient > 75% Assistive device: Aeronautical engineer Will patient use wheelchair at discharge?: No             Wheelchair 50 feet with 2 turns activity    Assist            Wheelchair 150 feet activity     Assist          Blood pressure 138/77, pulse 100, temperature 98.4 F (36.9 C), temperature source Oral, resp. rate 18, height 5\' 8"  (1.727 m), weight 114 kg, SpO2 92 %.  Medical  Problem List and Plan: 1.  Decreased functional mobility secondary to spinal stenosis S/P recent L2-L3 fusion 05/30/2019.  Decadron taper per neurosurgery             -patient may shower after covering incision             -ELOS/Goals: 8-13 days/supervision/min a            -Continue CIR therapies 2.  Antithrombotics: -DVT/anticoagulation: SCDs.               Vascular study ordered             -antiplatelet therapy: N/A 3. Pain Management: Voltaren gel 4 times daily, Cymbalta 60 mg daily, Robaxin-750 mig 3 times daily, MS Contin 15 mg every 12 hours, oxycodone as needed for breakthrough pain  3/13- increase Robaxin up to 1000 mg TID for spasms since they are biggest issue  3/14- improved muscle spasms ,but somewhat better- con't meds  3/15: Added Tizanidine 4mg  q6H prn for severe spasms. Asked nurse Ed to give one dose this morning.   3/16: Spasms much improved with this regimen.   3/17: Spasms are  better but continue to limit therapy; will add Skelaxin during the day to avoid oversedation, advised Mrs. Gensch to ask for Tizanidine at night if she wakes up from spasms that prevent her from returning to sleep.              Monitor with increased exertion 4. Mood: Provide emotional support             -antipsychotic agents: N/A  3/19: Patient would like to start medication for anxiety and insomnia. Will start Seroquel 25mg  HS.  5. Neuropsych: This patient is capable of making decisions on her own behalf. 6. Skin/Wound Care: Routine skin checks 7. Fluids/Electrolytes/Nutrition: Routine in and outs.  CMP ordered 8.  Acute on chronic anemia.  CBC ordered 9.  AKI on CKD stage III.  CMP ordered  3/13- Last Cr 1.25- up from 1.1- BUN 41- encourage fluids  3/14- pt admits to drinking ~ 1000cc/day- explains need to drink 2L/day minimim- said never had kidney issues  3/19: Improving, continue to trend, receiving IV fluids and drinking a lot of water.  10.  Hypertension.  Toprol-XL 12.5 mg daily.                Monitor with increased mobility  3/14- BP well controlled- con't regimen.  11.  Obesity.  BMI 38.67.  Dietary follow-up 12.  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  Follow-up outpatient Dr. Jana Hakim.  Continue Armidex 13.  Hyperlipidemia.  Lipitor 14.  History of gout.  Continue Zyloprim. 15.  Hypothyroidism.  TSH 1.420.  Continue Synthroid 16.  Chronic constipation.  Adjust bowel meds as necessary  3/14- on miralax BID- see if can have BM- could be cause of LLQ pain.   3/15: Increase Colace to TID. Refuses enema or mag citrate at this time.   3/16: Added senna HS.   3/17: Still no BM since 3/12. She is willing to take a suppository today.  3/18: see below.  17.  COPD/quit smoking 13 months ago.  Check oxygen saturations every shift  3/18: Apparently she has been sneaking cigarettes to smoke during the day. Will discuss further with patient and counsel.  18.  Tremors with restless leg syndrome.  Continue Requip as well as Mysoline  19. Hyperkalemia  3/13- Last K+ 5.1- will recheck Monday.   3/15: stable at 4.6  3/19: stable at 4.1 20. LLQ pain- is near groin, but still in abdomen- biggest source of pain except back spasms. -If not improved with BM, suggest U/S or CT.   3/15: KUB to assess for kidney stone vs. Constipation  3/16: KUB reviewed and discussed with patient: shows mild ileus and gas. Patient has multiple risk factors for ileus, including impaired mobility, obesity, hyperkalemia, hypothyroidism. She is already not eating much. Prescribed Mylanta for gas.   3/17: She has not requested Mylanta; will schedule  3/18: Had BM and is passing gas better now.   3/19: Renal US personally reviewed and shows no evidence of hydronephrosis.  21. Fatigue/pallor: Appreciate NSGY follow-up. Infection workup ordered and pending. Appears more fatigued than yesterday but also in less pain and better able to tolerate therapy; will maintain current muscle relaxers at  this time but monitor for sedation.    3/19: Appears much better after receiving fluids. Continue fluids and trend Creatinine, which is improving.  22.  Radiation induced tooth decay/Thursh:   3/19: Nystatin swish and swallow ordered. Daughter will schedule outpatient dentistry follow-up.  23. Disposition: I have  left voicemail with daughter 3/18 to discuss Mrs. Hudnall's care.   3/19: Updated daughter regarding patient's care yesterday and today.   LOS: 7 days A FACE TO FACE EVALUATION WAS PERFORMED  Martha Clan P Jac Romulus 07/05/2019, 10:21 AM

## 2019-07-05 NOTE — Progress Notes (Signed)
Occupational Therapy Weekly Progress Note  Patient Details  Name: Brandi Stephens MRN: [id number] Date of Birth: 1946/03/05  Beginning of progress report period: June 29, 2019 End of progress report period: July 05, 2019  Today's Date: 07/05/2019 OT Individual Time: [id number] OT Individual Time Calculation (min): 55 min    STGs not set as pt was expected to have a short LOS.  Have extended pt's stay by a few days.  Patient continues to demonstrate the following deficits: muscle weakness, decreased cardiorespiratoy endurance and decreased balance strategies and severe back pain and spasms and therefore will continue to benefit from skilled OT intervention to enhance overall performance with BADL.  Patient progressing toward long term goals..  Pt is progressing with most of her goals, but some have been modified based on her PLOF.  Plan of care revisions:  Problem: RH Balance Goal: LTG Patient will maintain dynamic standing with ADLs (OT) Description: LTG:  Patient will maintain dynamic standing balance with assist during activities of daily living (OT)  Flowsheets (Taken 07/05/2019 0954) LTG: Pt will maintain dynamic standing balance during ADLs with: (LTG downgraded based on pt's frequent back spasms.) Supervision/Verbal cueing   Problem: Sit to Stand Goal: LTG:  Patient will perform sit to stand in prep for activites of daily living with assistance level (OT) Description: LTG:  Patient will perform sit to stand in prep for activites of daily living with assistance level (OT) Flowsheets (Taken 07/05/2019 0954) LTG: PT will perform sit to stand in prep for activites of daily living with assistance level: (LTG downgraded to S based on pt's frequent back spasms.) Supervision/Verbal cueing   Problem: RH Dressing Goal: LTG Patient will perform lower body dressing w/assist (OT) Description: LTG: Patient will perform lower body dressing with assist, with/without cues in positioning using  equipment (OT) Flowsheets (Taken 07/05/2019 0954) LTG: Pt will perform lower body dressing with assistance level of: (LTG downgraded based on PLOF) Minimal Assistance - Patient > 75%   Problem: RH Toileting Goal: LTG Patient will perform toileting task (3/3 steps) with assistance level (OT) Description: LTG: Patient will perform toileting task (3/3 steps) with assistance level (OT)  Flowsheets (Taken 07/05/2019 0954) LTG: Pt will perform toileting task (3/3 steps) with assistance level: (LTG downgraded as pt needs A with cleansing due to back precautions) Minimal Assistance - Patient > 75%   Problem: RH Simple Meal Prep Goal: LTG Patient will perform simple meal prep w/assist (OT) Description: LTG: Patient will perform simple meal prep with assistance, with/without cues (OT). Flowsheets (Taken 07/05/2019 0954) LTG: Pt will perform simple meal prep with assistance level of: (LTG discontinued as pt can not tolerate standing or sitting upright in wc long enough for meal prep.) --   Problem: RH Light Housekeeping Goal: LTG Patient will perform light housekeeping w/assist (OT) Description: LTG: Patient will perform light housekeeping with assistance, with/without cues (OT). Flowsheets (Taken 07/05/2019 0954) LTG: Pt will perform light housekeeping with assistance level of: (LTG discontinued as pt can not tolerate being upright/standing long enough to complete housekeeping task.) --             OT Short Term Goals Week 1:  OT Short Term Goal 1 (Week 1): STG = LTG Week 2:  OT Short Term Goal 1 (Week 2): Pt's LOS extended a few days as she has had some delays due to medical complications.  Continue with revised LTGs.  Skilled Therapeutic Interventions/Progress Updates:    Pt received sitting on toilet with her daughter  in the room with her.  Pt had just finished toileting and needed some assist for thorough cleansing. She needed min A to doff and don pants over feet. Discussed trying with a  reacher, but her daughter said she had one at home and never used it because her hands were too shakey.   Pt was able to rise to stand and pull pants over hips with S.   She ambulated with close S to CGA and then pivoted to sit on EOB.  From EOB, completed oral care.  Pt opted to sit in recliner, S with stand pivot with RW.  From recliner, pt instructed in how to do gentle neck stretches as she sits with in a guarded position with her shoulders elevated. Pt taught to gently tilt head to side and press down slightly with hand.  Upon doing this, she had an immediate back spasm. Pt rested and then tried to engage in theraband exercises for UB but her back was still hurting.  MD arrived at the end of the session. Pt in recliner with her daughter in the room.   Therapy Documentation Precautions:  Precautions Precautions: Back, Fall Precaution Comments: verbally reviewed back precautions.  Restrictions Weight Bearing Restrictions: No   Pain: Pain Assessment Pain Score: 7  Pain Type: Acute pain Pain Location: Back Pain Orientation: Lower Pain Descriptors / Indicators: Aching;Spasm Pain Onset: Gradual Pain Intervention(s): RN made aware ADL: ADL Eating: Set up Grooming: Setup Where Assessed-Grooming: Sitting at sink Upper Body Bathing: Setup Where Assessed-Upper Body Bathing: Shower Lower Body Bathing: Moderate assistance Where Assessed-Lower Body Bathing: Shower Upper Body Dressing: Setup Where Assessed-Upper Body Dressing: Wheelchair Lower Body Dressing: Moderate assistance Where Assessed-Lower Body Dressing: Wheelchair Toileting: Minimal assistance Where Assessed-Toileting: Glass blower/designer: Therapist, music Method: Counselling psychologist: Energy manager: Curator Method: Heritage manager: Radio broadcast assistant ADL Comments: declined shower   Therapy/Group: Individual  Therapy  [place] 07/05/2019, 9:33 AM

## 2019-07-05 NOTE — Plan of Care (Signed)
  Problem: RH Balance Goal: LTG Patient will maintain dynamic standing with ADLs (OT) Description: LTG:  Patient will maintain dynamic standing balance with assist during activities of daily living (OT)  Flowsheets (Taken 07/05/2019 0954) LTG: Pt will maintain dynamic standing balance during ADLs with: (LTG downgraded based on pt's frequent back spasms.) Supervision/Verbal cueing   Problem: Sit to Stand Goal: LTG:  Patient will perform sit to stand in prep for activites of daily living with assistance level (OT) Description: LTG:  Patient will perform sit to stand in prep for activites of daily living with assistance level (OT) Flowsheets (Taken 07/05/2019 0954) LTG: PT will perform sit to stand in prep for activites of daily living with assistance level: (LTG downgraded to S based on pt's frequent back spasms.) Supervision/Verbal cueing   Problem: RH Dressing Goal: LTG Patient will perform lower body dressing w/assist (OT) Description: LTG: Patient will perform lower body dressing with assist, with/without cues in positioning using equipment (OT) Flowsheets (Taken 07/05/2019 0954) LTG: Pt will perform lower body dressing with assistance level of: (LTG downgraded based on PLOF) Minimal Assistance - Patient > 75%   Problem: RH Toileting Goal: LTG Patient will perform toileting task (3/3 steps) with assistance level (OT) Description: LTG: Patient will perform toileting task (3/3 steps) with assistance level (OT)  Flowsheets (Taken 07/05/2019 0954) LTG: Pt will perform toileting task (3/3 steps) with assistance level: (LTG downgraded as pt needs A with cleansing due to back precautions) Minimal Assistance - Patient > 75%   Problem: RH Simple Meal Prep Goal: LTG Patient will perform simple meal prep w/assist (OT) Description: LTG: Patient will perform simple meal prep with assistance, with/without cues (OT). Flowsheets (Taken 07/05/2019 0954) LTG: Pt will perform simple meal prep with assistance  level of: (LTG discontinued as pt can not tolerate standing or sitting upright in wc long enough for meal prep.) --   Problem: RH Light Housekeeping Goal: LTG Patient will perform light housekeeping w/assist (OT) Description: LTG: Patient will perform light housekeeping with assistance, with/without cues (OT). Flowsheets (Taken 07/05/2019 0954) LTG: Pt will perform light housekeeping with assistance level of: (LTG discontinued as pt can not tolerate being upright/standing long enough to complete housekeeping task.) --

## 2019-07-05 NOTE — Progress Notes (Signed)
  NEUROSURGERY PROGRESS NOTE   Social visit Patient seen this am. Appears more alert. Back spasms are decreasing in intensity. Incision continues to look great & without signs of infection. Appreciate Dr Ranell Patrick and Dr Jacob Moores care of Brandi Stephens.   No charge visit.

## 2019-07-05 NOTE — Consult Note (Signed)
Medical Consultation   Brandi Stephens  [id number]  DOB: September 28, 1945  DOA: 06/28/2019  PCP: Girtha Rm, NP-C    Requesting physician:   Reason for consultation: Multiple medical problems   History of Present Illness: 74 y.o. WF PMHx Anxiety, Depression, EtOH abuse anemia of pregnancy HTN, LEFT breast cancer s/p lobectomy plus XRT, Hypothyroidism HTN, Chronic Diastolic CHF, arrhythmia, HLD, COPD, former smoker, Obesity with BMI of 38, restless leg syndrome, chronic constipation, Drug dependency, Elevated PTHrP level CKD stage III, lumbar spinal stenosis s/p decompression fusion 05/30/2019 per Dr. Kathyrn Sheriff   Admitted with low back pain and radiation and weakness in left leg on 06/25/2019 and subsequently admitted for comprehensive rehab program on 06/28/2019.  Patient was evaluated by neurosurgery this morning and felt that patient is more pale and fatigued and ordered infectious work-up.  Hospitalist consulted for concern of worsening kidney function, drop in H&H and concern for UTI.  She has mild ileus which is noted on abdominal x-ray however patient had regular bowel movement and has been passing gas.  Patient tells me that she is getting better every day, still has painful muscular spasm and nausea and vomiting.  She tells me that she has left flank pain since 1 week and foul-smelling urine which she noticed this morning.  Denies dysuria, hematuria, fever, chills, cough, congestion, runny nose, swelling or redness in bilateral lower extremities, decreased appetite, melena, hematemesis.   She is working very well with physical therapy.  She is currently not on chemical anticoagulation for DVT prophylaxis. She lives with her spouse at home.  Denies smoking, illicit drug use however drinks alcohol 5 times per week.      Review of Systems:  Review of Systems  Constitutional: Negative.   HENT: Negative.   Eyes: Negative.   Respiratory: Negative.     Cardiovascular: Negative.   Gastrointestinal: Negative.   Genitourinary: Negative.   Musculoskeletal: Positive for back pain and joint pain. Negative for falls, myalgias and neck pain.  Skin: Negative.   Neurological: Positive for dizziness, tingling, tremors, sensory change, speech change, seizures, loss of consciousness, weakness and headaches. Negative for focal weakness.  Endo/Heme/Allergies: Negative.   Psychiatric/Behavioral: Negative.      Past Medical History: Past Medical History:  Diagnosis Date  . AIN (anal intraepithelial neoplasia) anal canal   . Anemia    with pregnancy  . Anxiety   . Arthritis    knees, lower back, knees  . Chronic constipation   . Chronic diastolic CHF (congestive heart failure) ([place]) 07/05/2019  . CKD (chronic kidney disease)   . Coarse tremors    Essential  . COPD (chronic obstructive pulmonary disease) ([place])    2017 chest CT, history of  . Degenerative lumbar spinal stenosis   . Depression   . Depression 07/05/2019  . Drug dependency ([place])   . Elevated PTHrP level 05/09/2019  . ETOH abuse 07/05/2019  . GERD (gastroesophageal reflux disease)    pt denies  . Gout   . Grade I diastolic dysfunction 0000000   Noted ECHO  . Hemorrhoids   . History of adenomatous polyp of colon   . History of basal cell carcinoma (BCC) excision 2015   nose  . History of sepsis 05/14/2014   post lumbar surgery  . History of shingles    x2  . Hyperlipidemia   . Hypertension   . Hypothyroidism   . Irregular heart  beat    history of while undergoing radiation  . Malignant neoplasm of upper-inner quadrant of left breast in female, estrogen receptor positive [facility]) oncologist-  dr Jana Hakim--  per lov in epic no recurrence   dx 07-13-2015  Left breast invasive ductal carcinoma, Stage IA, Grade 1 (TXN0),  09-16-2015  s/p  left breast lumpectomy with sln bx's,  completed radiation 11-18-2015,  started antiestrogen therapy 12-14-2015  . Neuropathy    Left foot  and back of left leg  . Obesity (BMI 30-39.9) 07/05/2019  . Personal history of radiation therapy    completed 11-18-2015  left breast  . Pneumonia   . PONV (postoperative nausea and vomiting)   . Prediabetes    diet controlled, no med  . Restless leg syndrome   . Seasonal allergies   . Seizure ([place]) 05/2015   due to sepsis, only one time episode  . Tremor   . Wears partial dentures    lower     Past Surgical History: Past Surgical History:  Procedure Laterality Date  . ABDOMINAL HYSTERECTOMY  1985  . BASAL CELL CARCINOMA EXCISION  10/15   nose  . BREAST BIOPSY Right 08/24/2015  . BREAST BIOPSY Right 07/13/2015  . BREAST BIOPSY Left 07/13/2015  . BREAST EXCISIONAL BIOPSY Left 1972  . BREAST LUMPECTOMY Left   . BREAST LUMPECTOMY WITH RADIOACTIVE SEED AND SENTINEL LYMPH NODE BIOPSY Left 09/16/2015   Procedure: BREAST LUMPECTOMY WITH RADIOACTIVE SEED AND SENTINEL LYMPH NODE BIOPSY;  Surgeon: Stark Klein, MD;  Location: [facility];  Service: General;  Laterality: Left;  . BREAST REDUCTION SURGERY Bilateral 1998  . CATARACT EXTRACTION W/ INTRAOCULAR LENS IMPLANT Left 2016  . [place]   hallo  . COLONOSCOPY  01/2013   polyps  . EYE SURGERY Left 2016  . HAND SURGERY Left 02-19-2002   dr Fredna Dow  @[facility]    repair collateral ligament/ MPJ left thumb  . [place]  2008  . HIGH RESOLUTION ANOSCOPY N/A 02/28/2018   Procedure: HIGH RESOLUTION ANOSCOPY WITH BIOPSY;  Surgeon: Leighton Ruff, MD;  Location[facility];  Service: General;  Laterality: N/A;  . KNEE ARTHROSCOPY Right 2002  . LUMBAR SPINE SURGERY  03-20-2015   dr Kathyrn Sheriff   fusion L4-5, L5-S1  . MOUTH SURGERY  07/14/2018   2 infected teeth removed  . MULTIPLE TOOTH EXTRACTIONS     with bone grafting lower bottom right and left  . REDUCTION MAMMAPLASTY Bilateral   . RIGHT COLECTOMY  09-10-2003   dr Margot Chimes @[facility]    multiple colon polyps (per path tubular adenoma's,  hyperplastic , benign appendix, benign two lymph nodes  . ROTATOR CUFF REPAIR Left 04/2016  . TONSILLECTOMY  1969  . TUBAL LIGATION Bilateral 1982     Allergies:   Allergies  Allergen Reactions  . Ampicillin Hives and Other (See Comments)    Severe reaction in February 2017 1.5 month to have hives to go away  . Penicillins Hives and Other (See Comments)    Severe reaction in February 2017 1.5 month to have hives to go away Has patient had a PCN reaction causing immediate rash, facial/tongue/throat swelling, SOB or lightheadedness with hypotension: #  #  #  YES  #  #  #  Has patient had a PCN reaction causing severe rash involving mucus membranes or skin necrosis: No Has patient had a PCN reaction that required hospitalization No Has patient had a PCN reaction occurring within the last  10 years: No   . Gabapentin Swelling    UNSPECIFIED REACTION   . Other Itching    UNSPECIFIED Analgesics  . Codeine Nausea And Vomiting  . Hydrocodone-Acetaminophen Itching  . Hydromorphone Itching    Pt has taken w/o issues     Social History:  reports that she quit smoking about 13 months ago. Her smoking use included cigarettes. She has a 17.50 pack-year smoking history. She has never used smokeless tobacco. She reports current alcohol use. She reports that she does not use drugs.   Family History: Family History  Problem Relation Age of Onset  . Atrial fibrillation Mother   . Ulcers Father   . Breast cancer Maternal Aunt   . Lung cancer Maternal Uncle   . Stomach cancer Maternal Grandmother   . Lung cancer Maternal Aunt   . Brain cancer Maternal Aunt   . Prostate cancer Maternal Grandfather   . Stroke Paternal Grandmother   . Tuberculosis Paternal Grandfather   . Colon cancer Neg Hx   . Esophageal cancer Neg Hx   . Rectal cancer Neg Hx     Unacceptable: Noncontributory, unremarkable, or negative. Acceptable: Family history reviewed and not pertinent (If you reviewed  it)   Procedures/Significant Events:  Lumbar spinal stenosis s/p decompression fusion 05/30/2019 per Dr. Kathyrn Sheriff  ___________________________________________________________________________________-     I have personally reviewed and interpreted all radiology studies and my findings are as above.  VENTILATOR SETTINGS:   Cultures 3/18 RIGHT AC NGTD x2 3/18 urine positive multiple cultures 3/19 urine pending   Antimicrobials: Anti-infectives (From admission, onward)   Start     Dose/Rate Stop   07/04/19 2000  ciprofloxacin (CIPRO) tablet 250 mg     250 mg 07/07/19 1959   07/04/19 1315  sulfamethoxazole-trimethoprim (BACTRIM) 400-80 MG per tablet 1 tablet  Status:  Discontinued     1 tablet 07/04/19 1313       Devices    LINES / TUBES:      Continuous Infusions: . sodium chloride 100 mL/hr at 07/04/19 1558     Physical Exam: Vitals:   07/04/19 1355 07/04/19 1430 07/04/19 1929 07/05/19 0452  BP: (!) 76/37 (!) 92/49 (!) 107/51 138/77  Pulse: 65  (!) 58 100  Resp: 18  18 18   Temp: 99.2 F (37.3 C)  98.6 F (37 C) 98.4 F (36.9 C)  TempSrc: Oral  Oral Oral  SpO2: 94%  90% 92%  Weight:      Height:        General: A/O x4, no acute respiratory distress Eyes: negative scleral hemorrhage, negative anisocoria, negative icterus ENT: Negative Runny nose, negative gingival bleeding, Neck:  Negative scars, masses, torticollis, lymphadenopathy, JVD Lungs: Clear to auscultation bilaterally without wheezes or crackles Cardiovascular: Regular rate and rhythm without murmur gallop or rub normal S1 and S2 Abdomen: Obese, negative abdominal pain, nondistended, positive soft, bowel sounds, no rebound, no ascites, no appreciable mass Extremities: No significant cyanosis, clubbing, or edema bilateral lower extremities Skin: Negative rashes, lesions, ulcers Psychiatric:  Negative depression, negative anxiety, negative fatigue, negative mania  Central nervous system:   Cranial nerves II through XII intact, tongue/uvula midline, all extremities muscle strength 5/5, except LEFT lower extremity strength 2-3/5, sensation intact throughout, negative dysarthria, negative expressive aphasia, negative receptive aphasia.  Data reviewed:  I have personally reviewed following labs and imaging studies Labs:  CBC: Recent Labs  Lab 07/01/19 0508 07/04/19 1008 07/05/19 0428  WBC 10.9* 11.4* 9.9  NEUTROABS 7.2 8.3* 6.8  HGB 10.2* 8.2* 8.0*  HCT 32.5* 25.8* 25.6*  MCV 100.0 97.4 99.2  PLT 444* 411* [id number]    Basic Metabolic Panel: Recent Labs  Lab 07/01/19 0508 07/01/19 0508 07/04/19 1008 07/05/19 0428  NA 138  --  130* 131*  K 4.6   < > 4.9 4.1  CL 101  --  98 99  CO2 25  --  22 20*  GLUCOSE 177*  --  193* 175*  BUN 26*  --  36* 34*  CREATININE 1.05*  --  1.76* 1.64*  CALCIUM 9.8  --  9.1 8.9   < > = values in this interval not displayed.   GFR Estimated Creatinine Clearance: 41.1 mL/min (A) (by C-G formula based on SCr of 1.64 mg/dL (H)). Liver Function Tests: Recent Labs  Lab 07/01/19 0508 07/04/19 1008 07/05/19 0428  AST 17 25 26   ALT 27 31 32  ALKPHOS 92 92 86  BILITOT 0.6 0.6 0.5  PROT 6.0* 5.7* 5.5*  ALBUMIN 2.8* 2.5* 2.4*   No results for input(s): LIPASE, AMYLASE in the last 168 hours. No results for input(s): AMMONIA in the last 168 hours. Coagulation profile No results for input(s): INR, PROTIME in the last 168 hours.  Cardiac Enzymes: No results for input(s): CKTOTAL, CKMB, CKMBINDEX, TROPONINI in the last 168 hours. BNP: Invalid input(s): POCBNP CBG: No results for input(s): GLUCAP in the last 168 hours. D-Dimer No results for input(s): DDIMER in the last 72 hours. Hgb A1c No results for input(s): HGBA1C in the last 72 hours. Lipid Profile No results for input(s): CHOL, HDL, LDLCALC, TRIG, CHOLHDL, LDLDIRECT in the last 72 hours. Thyroid function studies No results for input(s): TSH, T4TOTAL, T3FREE, THYROIDAB in the  last 72 hours.  Invalid input(s): FREET3 Anemia work up Recent Labs    07/04/19 1353  VITAMINB12 1,594*  FERRITIN 309*  TIBC 262  IRON 23*   Urinalysis    Component Value Date/Time   COLORURINE AMBER (A) 07/04/2019 1300   APPEARANCEUR CLOUDY (A) 07/04/2019 1300   LABSPEC 1.026 07/04/2019 1300   LABSPEC 1.025 06/24/2019 1426   PHURINE 5.0 07/04/2019 1300   GLUCOSEU NEGATIVE 07/04/2019 1300   HGBUR NEGATIVE 07/04/2019 1300   BILIRUBINUR MODERATE (A) 07/04/2019 1300   BILIRUBINUR moderate (A) 06/24/2019 1426   BILIRUBINUR n 11/25/2015 1712   KETONESUR NEGATIVE 07/04/2019 1300   PROTEINUR NEGATIVE 07/04/2019 1300   UROBILINOGEN negative 11/25/2015 1712   NITRITE NEGATIVE 07/04/2019 1300   LEUKOCYTESUR MODERATE (A) 07/04/2019 1300     Microbiology No results found for this or any previous visit (from the past 240 hour(s)).     Inpatient Medications:   Scheduled Meds: . alum & mag hydroxide-simeth  15 mL Oral TID  . anastrozole  1 mg Oral Daily  . atorvastatin  20 mg Oral q1800  . ciprofloxacin  250 mg Oral BID  . diclofenac Sodium  4 g Topical QID  . docusate sodium  100 mg Oral TID  . levothyroxine  75 mcg Oral Q0600  . magnesium gluconate  1,000 mg Oral QHS  . Melatonin  10.5 mg Oral QHS  . metaxalone  800 mg Oral TID  . methocarbamol  1,000 mg Oral TID  . morphine  15 mg Oral Q12H  . polyethylene glycol  17 g Oral BID  . primidone  50 mg Oral QHS  . rOPINIRole  0.75 mg Oral QHS  . senna  2 tablet Oral QHS  . sodium chloride flush  10-40 mL  Intracatheter Q12H  . vitamin B-12  1,000 mcg Oral Daily   Continuous Infusions: . sodium chloride 100 mL/hr at 07/04/19 1558     Radiological Exams on Admission: DG Chest 2 View  Result Date: 07/04/2019 CLINICAL DATA:  Fatigue.  Back pain and spasms EXAM: CHEST - 2 VIEW COMPARISON:  03/01/2019 FINDINGS: Mild cardiomegaly. Negative aortic and hilar contours when accounting for rightward rotation. Postoperative left  breast and axilla. There is no edema, consolidation, effusion, or pneumothorax. IMPRESSION: Cardiomegaly without failure. Electronically Signed   By: Monte Fantasia M.D.   On: 07/04/2019 10:37   US RENAL  Result Date: 07/04/2019 CLINICAL DATA:  Pain at LEFT flank for 2 weeks, UTI EXAM: RENAL / URINARY TRACT ULTRASOUND COMPLETE COMPARISON:  None FINDINGS: Right Kidney: Renal measurements: 11.2 x 4.7 x 7.5 cm = volume: 206 mL. Mild cortical thinning. Normal cortical echogenicity. No mass, hydronephrosis, or shadowing calcification. Left Kidney: Renal measurements: 10.5 x 6.2 x 6.1 cm = volume: 208 mL. Mild cortical thinning. Normal cortical echogenicity. Small cyst at inferior pole 2.2 x 1.9 x 1.4 cm. No additional mass, hydronephrosis or shadowing calcification. Bladder: Appears normal for degree of bladder distention. Other: Echogenic hepatic parenchyma, likely reflecting fatty infiltration, without obvious nodularity or mass to suggest cirrhosis. IMPRESSION: LEFT renal cyst 2.2 cm greatest size. No other renal sonographic abnormalities identified. Suspected fatty infiltration of liver. Electronically Signed   By: Lavonia Dana M.D.   On: 07/04/2019 16:55    Impression/Recommendations Principal Problem:   Spinal stenosis of lumbar region Active Problems:   Mixed hyperlipidemia   Essential hypertension   COPD (chronic obstructive pulmonary disease) (HCC)   Malignant neoplasm of upper-inner quadrant of left breast in female, estrogen receptor positive ([facility])   PVC's (premature ventricular contractions)   RLS (restless legs syndrome)   Hypothyroidism   Lumbar spinal stenosis   Weakness of left lower extremity   Acute on chronic anemia   AKI (acute kidney injury) ([place])   Anxiety   Depression   Chronic diastolic CHF (congestive heart failure) ([facility])   Obesity (BMI 30-39.9)   ETOH abuse  Acute on CKD stage IIIb (baseline Cr 1.18 06/27/2019) Recent Labs  Lab 07/01/19 0508 07/04/19 1008  07/05/19 0428  CREATININE 1.05* 1.76* 1.64*  -N/V resolved, continue hydration normal saline 151ml/hr -Continue to hold Cymbalta GFR<30 -Hold nephrotoxic medication -3/18 renal ultrasound nondiagnostic see results above  UTI -UA is positive for leukocytes and bacteria.  Left flank pain resolved -Leukocytosis resolved -Continue Cipro 250 BID x5 days -Urine/blood culture pending  Essential HTN -Hold BP meds restart if BP trends up  Mild ileus -3/19 patient not complaining of abdominal pain t -3/20 KUB pending  Hypothyroidism:  -TSH: WNL checked on 03/18/2019 -Levothyroxine 75 mcg daily  Acute on chronic anemia: (Baseline HgB 9.1) Recent Labs  Lab 07/01/19 0508 07/04/19 1008 07/05/19 0428  HGB 10.2* 8.2* 8.0*  -Anemia panel pending -Occult blood pending  Hyponatremia -Asymptomatic -Monitor closely if continues to decline may need to DC psychiatric medication  Muscle spasm -Robaxin -Metaxalone  Restless leg syndrome -Primidone -Requip  Thrombocytosis -Most likely reactive resolving continue to monitor  HLD -Lipitor 20 mg daily  HX LEFT breast cancer -S/p mastectomy, XRT -Continue Arimidex 1 mg daily  Gout -Allopurinol 300 mg daily -Uric acid level pending  Spinal stenosis of LUMBAR spine region -Status post recent L2-L3 fusion on 05/30/2019 -Neurosurgery and PT is following.  Thank you for this consultation.  Our [facility] hospitalist team will follow  the patient with you.      Time Spent: 30-minute  Mikeya Tomasetti, Geraldo Docker M.D. [facility] 07/05/2019, 7:49 AM  [id number]

## 2019-07-05 NOTE — Progress Notes (Signed)
Physical Therapy Session Note  Patient Details  Name: Brandi Stephens MRN: [id number] Date of Birth: 05/09/1945  Today's Date: 07/05/2019 PT Missed Time: 75 Minutes Missed Time Reason: Pain  Short Term Goals: Week 1:  PT Short Term Goal 1 (Week 1): = to LTGs based on ELOS  Skilled Therapeutic Interventions/Progress Updates:     Pt misses 75 minutes of skilled PT secondary to "tremors" causing repeated back spasms. Also reporting nausea, emesis, and chills. RN updated on pt status. PT will follow up as able.   Therapy Documentation Precautions:  Precautions Precautions: Back, Fall Precaution Comments: verbally reviewed back precautions.  Restrictions Weight Bearing Restrictions: No    Therapy/Group: Individual Therapy  Breck Coons, PT, DPT 07/05/2019, 3:20 PM

## 2019-07-06 ENCOUNTER — Inpatient Hospital Stay (HOSPITAL_COMMUNITY): Payer: PPO

## 2019-07-06 ENCOUNTER — Inpatient Hospital Stay (HOSPITAL_COMMUNITY): Payer: PPO | Admitting: Physical Therapy

## 2019-07-06 DIAGNOSIS — M48061 Spinal stenosis, lumbar region without neurogenic claudication: Secondary | ICD-10-CM

## 2019-07-06 LAB — COMPREHENSIVE METABOLIC PANEL
ALT: 32 U/L (ref 0–44)
AST: 24 U/L (ref 15–41)
Albumin: 2.3 g/dL — ABNORMAL LOW (ref 3.5–5.0)
Alkaline Phosphatase: 85 U/L (ref 38–126)
Anion gap: 9 (ref 5–15)
BUN: 16 mg/dL (ref 8–23)
CO2: 22 mmol/L (ref 22–32)
Calcium: 8.9 mg/dL (ref 8.9–10.3)
Chloride: 103 mmol/L (ref 98–111)
Creatinine, Ser: 0.95 mg/dL (ref 0.44–1.00)
GFR calc Af Amer: >60 mL/min (ref >60)
GFR calc non Af Amer: 60 mL/min — ABNORMAL LOW (ref >60)
Glucose, Bld: 148 mg/dL — ABNORMAL HIGH (ref 70–99)
Potassium: 4.3 mmol/L (ref 3.5–5.1)
Sodium: 134 mmol/L — ABNORMAL LOW (ref 135–145)
Total Bilirubin: 0.4 mg/dL (ref 0.3–1.2)
Total Protein: 5.4 g/dL — ABNORMAL LOW (ref 6.5–8.1)

## 2019-07-06 LAB — CBC
HCT: 25.4 % — ABNORMAL LOW (ref 36.0–46.0)
Hemoglobin: 8.1 g/dL — ABNORMAL LOW (ref 12.0–15.0)
MCH: 31.4 pg (ref 26.0–34.0)
MCHC: 31.9 g/dL (ref 30.0–36.0)
MCV: 98.4 fL (ref 80.0–100.0)
Platelets: 374 10*3/uL (ref 150–400)
RBC: 2.58 MIL/uL — ABNORMAL LOW (ref 3.87–5.11)
RDW: 14 % (ref 11.5–15.5)
WBC: 8.4 10*3/uL (ref 4.0–10.5)
nRBC: 0 % (ref 0.0–0.2)

## 2019-07-06 LAB — PHOSPHORUS: Phosphorus: 3 mg/dL (ref 2.5–4.6)

## 2019-07-06 LAB — URIC ACID: Uric Acid, Serum: 6.3 mg/dL (ref 2.5–7.1)

## 2019-07-06 LAB — MAGNESIUM: Magnesium: 2 mg/dL (ref 1.7–2.4)

## 2019-07-06 MED ORDER — DULOXETINE HCL 30 MG PO CPEP
60.0000 mg | ORAL_CAPSULE | Freq: Every day | ORAL | Status: DC
  Administered 2019-07-06 – 2019-07-11 (×6): 60 mg via ORAL
  Filled 2019-07-06 (×7): qty 2
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
## 2019-07-06 NOTE — Progress Notes (Signed)
During am assessment, patient was able to turn towards nurse with little difficulty, though told me that she was having "spasms".  MD arrived in room a few moments later and patient stated that she was having "constant nausea" and "vomited".  This was not observed and emesis was not seen in her emesis receptacles.  Her spasm - like movements became more exaggerated and pronounced and she requested pain medication, in addition to her scheduled meds.  She slept through much of the late morning and until 1245.  She was able to get OOB with minimal assist of one and FWRW to bathroom for a bowel movement.  She accomplished this with minimal assist.  Encouraged to be in recliner chair with encouragement for deep breathing through spasmodic activity.  After 10 minutes, she implored the help of a physician to get back to bed.

## 2019-07-06 NOTE — Progress Notes (Signed)
[place] PHYSICAL MEDICINE & REHABILITATION PROGRESS NOTE   Subjective/Complaints:  Still with spasms and anxiety  Xray ordered per IM, denies abd pain this am, some nausea  ROS:  Pt denies SOB, CP, and vision changes   Objective:   DG Chest 2 View  Result Date: 07/04/2019 CLINICAL DATA:  Fatigue.  Back pain and spasms EXAM: CHEST - 2 VIEW COMPARISON:  03/01/2019 FINDINGS: Mild cardiomegaly. Negative aortic and hilar contours when accounting for rightward rotation. Postoperative left breast and axilla. There is no edema, consolidation, effusion, or pneumothorax. IMPRESSION: Cardiomegaly without failure. Electronically Signed   By: Monte Fantasia M.D.   On: 07/04/2019 10:37   US RENAL  Result Date: 07/04/2019 CLINICAL DATA:  Pain at LEFT flank for 2 weeks, UTI EXAM: RENAL / URINARY TRACT ULTRASOUND COMPLETE COMPARISON:  None FINDINGS: Right Kidney: Renal measurements: 11.2 x 4.7 x 7.5 cm = volume: 206 mL. Mild cortical thinning. Normal cortical echogenicity. No mass, hydronephrosis, or shadowing calcification. Left Kidney: Renal measurements: 10.5 x 6.2 x 6.1 cm = volume: 208 mL. Mild cortical thinning. Normal cortical echogenicity. Small cyst at inferior pole 2.2 x 1.9 x 1.4 cm. No additional mass, hydronephrosis or shadowing calcification. Bladder: Appears normal for degree of bladder distention. Other: Echogenic hepatic parenchyma, likely reflecting fatty infiltration, without obvious nodularity or mass to suggest cirrhosis. IMPRESSION: LEFT renal cyst 2.2 cm greatest size. No other renal sonographic abnormalities identified. Suspected fatty infiltration of liver. Electronically Signed   By: Lavonia Dana M.D.   On: 07/04/2019 16:55   Recent Labs    07/05/19 0428 07/06/19 0622  WBC 9.9 8.4  HGB 8.0* 8.1*  HCT 25.6* 25.4*  PLT 380 374   Recent Labs    07/04/19 1008 07/05/19 0428  NA 130* 131*  K 4.9 4.1  CL 98 99  CO2 22 20*  GLUCOSE 193* 175*  BUN 36* 34*  CREATININE  1.76* 1.64*  CALCIUM 9.1 8.9    Intake/Output Summary (Last 24 hours) at 07/06/2019 0743 Last data filed at 07/06/2019 0400 Gross per 24 hour  Intake 3975 ml  Output 600 ml  Net 3375 ml     Physical Exam: Vital Signs Blood pressure (!) 161/95, pulse (!) 105, temperature 98.2 F (36.8 C), temperature source Oral, resp. rate 19, height 5\' 8"  (1.727 m), weight 114 kg, SpO2 96 %. Vitals reviewed. Constitutional: Better energy this morning.  HENT:  Head: conjugate gaze CV: RRR; no JVD  Respiratory: CTA B/L- good air movement GI: soft, NT, ND, (+)BS- TTP over LLQ near groin but still in abdomen- no rebound- nothing palpated per se.  Musculoskeletal:     Comments: No edema or tenderness in extremities  Neurological: Ox3 Cooperative with exam. Generalized tremor noted Motor: Bilateral upper extremities: 4-4+/5 proximal distal Bilateral lower extremities: 4-4+/5 proximal distal Sensation diminished to light touch distal extremities  Continuing to have spasms.  Skin:  Back incision well healing from lumbar to sacrum- no drainage or erythema Psychiatric: talkative; appropriate.    Assessment/Plan: 1. Functional deficits secondary to acute on chronic back pain S/P recent L2-L3 fusion 05/30/2019. which require 3+ hours per day of interdisciplinary therapy in a comprehensive inpatient rehab setting.  Physiatrist is providing close team supervision and 24 hour management of active medical problems listed below.  Physiatrist and rehab team continue to assess barriers to discharge/monitor patient progress toward functional and medical goals  Care Tool:  Bathing    Body parts bathed by patient: Right arm, Left arm,  Chest, Abdomen, Face, Front perineal area, Right upper leg, Left upper leg   Body parts bathed by helper: Buttocks, Left lower leg, Right lower leg Body parts n/a: Front perineal area, Buttocks, Right upper leg, Left upper leg, Right lower leg   Bathing assist Assist Level:  Moderate Assistance - Patient 50 - 74%     Upper Body Dressing/Undressing Upper body dressing Upper body dressing/undressing activity did not occur (including orthotics): N/A What is the patient wearing?: Bra, Pull over shirt    Upper body assist Assist Level: Set up assist    Lower Body Dressing/Undressing Lower body dressing      What is the patient wearing?: Pants, Underwear/pull up     Lower body assist Assist for lower body dressing: Moderate Assistance - Patient 50 - 74%     Toileting Toileting    Toileting assist Assist for toileting: Minimal Assistance - Patient > 75% Assistive Device Comment: (walker)   Transfers Chair/bed transfer  Transfers assist     Chair/bed transfer assist level: Supervision/Verbal cueing Chair/bed transfer assistive device: Programmer, multimedia   Ambulation assist      Assist level: Contact Guard/Touching assist Assistive device: Walker-rolling Max distance: 70ft   Walk 10 feet activity   Assist     Assist level: Contact Guard/Touching assist Assistive device: Walker-rolling   Walk 50 feet activity   Assist    Assist level: Contact Guard/Touching assist Assistive device: Walker-rolling    Walk 150 feet activity   Assist    Assist level: Contact Guard/Touching assist Assistive device: Walker-rolling    Walk 10 feet on uneven surface  activity   Assist     Assist level: Minimal Assistance - Patient > 75% Assistive device: Aeronautical engineer Will patient use wheelchair at discharge?: No             Wheelchair 50 feet with 2 turns activity    Assist            Wheelchair 150 feet activity     Assist          Blood pressure (!) 161/95, pulse (!) 105, temperature 98.2 F (36.8 C), temperature source Oral, resp. rate 19, height 5\' 8"  (1.727 m), weight 114 kg, SpO2 96 %.  Medical Problem List and Plan: 1.  Decreased functional mobility  secondary to spinal stenosis S/P recent L2-L3 fusion 05/30/2019.  Decadron taper per neurosurgery             -patient may shower after covering incision             -ELOS/Goals: 8-13 days/supervision/min a            -Continue CIR PT< OT 2.  Antithrombotics: -DVT/anticoagulation: SCDs.               Vascular study ordered             -antiplatelet therapy: N/A 3. Pain Management: Voltaren gel 4 times daily, Cymbalta 60 mg daily, Robaxin-750 mig 3 times daily, MS Contin 15 mg every 12 hours, oxycodone as needed for breakthrough pain  3/13- increase Robaxin up to 1000 mg TID for spasms since they are biggest issue  Stop Skelaxin, add low scheduled dose of tizanidine with hold for sedation Pt very anxious , may consider low dose valium at least at noc if tizanidine schedule is not helpful  4. Mood: Provide emotional support             -  antipsychotic agents: N/A  3/19: Patient would like to start medication for anxiety and insomnia. Will start Seroquel 25mg  HS.  5. Neuropsych: This patient is capable of making decisions on her own behalf. 6. Skin/Wound Care: Routine skin checks 7. Fluids/Electrolytes/Nutrition: Routine in and outs.  CMP ordered 8.  Acute on chronic anemia.  CBC ordered 9.  AKI on CKD stage III.  CMP ordered IM on consult 10.  Hypertension.  Toprol-XL 12.5 mg daily.               Monitor with increased mobility  IM on consult  11.  Obesity.  BMI 38.67.  Dietary follow-up 12.  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  Follow-up outpatient Dr. Jana Hakim.  Continue Armidex 13.  Hyperlipidemia.  Lipitor 14.  History of gout.  Continue Zyloprim. 15.  Hypothyroidism.  TSH 1.420.  Continue Synthroid 16.  Chronic constipation.  Adjust bowel meds as necessary  3/14- on miralax BID- see if can have BM- could be cause of LLQ pain.   3/15: Increase Colace to TID. Refuses enema or mag citrate at this time.   3/16: Added senna HS.   3/17: Still no BM since 3/12.  She is willing to take a suppository today.  3/18: see below.  17.  COPD/quit smoking 13 months ago.  Check oxygen saturations every shift  3/18: Apparently she has been sneaking cigarettes to smoke during the day. Will discuss further with patient and counsel.  IM on consult  18.  Tremors with restless leg syndrome.  Continue Requip as well as Mysoline  19. Hyperkalemia  3/13- Last K+ 5.1- will recheck Monday.   3/15: stable at 4.6  3/19: stable at 4.1 20. LLQ pain- is near groin, but still in abdomen- biggest source of pain except back spasms. -If not improved with BM, suggest U/S or CT.   3/15: KUB to assess for kidney stone vs. Constipation  3/16: KUB reviewed and discussed with patient: shows mild ileus and gas. Patient has multiple risk factors for ileus, including impaired mobility, obesity, hyperkalemia, hypothyroidism. She is already not eating much. Prescribed Mylanta for gas.   3/17: She has not requested Mylanta; will schedule  3/18: Had BM and is passing gas better now.   3/19: Renal US personally reviewed and shows no evidence of hydronephrosis. KUB ordered per IM  IM on COnsult  21. Fatigue/pallor: Appreciate NSGY follow-up. Infection workup ordered and pending. Appears more fatigued than yesterday but also in less pain and better able to tolerate therapy; will maintain current muscle relaxers at this time but monitor for sedation.    3/19: Appears much better after receiving fluids. Continue fluids and trend Creatinine, which is improving.  22.  Radiation induced tooth decay/Thursh:   3/19: Nystatin swish and swallow ordered. Daughter will schedule outpatient dentistry follow-up.   LOS: 8 days A FACE TO FACE EVALUATION WAS PERFORMED  Charlett Blake 07/06/2019, 7:43 AM

## 2019-07-06 NOTE — Consult Note (Signed)
Medical Consultation   Brandi Stephens  [id number]  DOB: 30-Jul-1945  DOA: 06/28/2019  PCP: Girtha Rm, NP-C    Requesting physician:   Reason for consultation: Multiple medical problems   History of Present Illness: 74 y.o. WF PMHx Anxiety, Depression, EtOH abuse anemia of pregnancy HTN, LEFT breast cancer s/p lobectomy plus XRT, Hypothyroidism HTN, Chronic Diastolic CHF, arrhythmia, HLD, COPD, former smoker, Obesity with BMI of 38, restless leg syndrome, chronic constipation, Drug dependency, Elevated PTHrP level CKD stage III, lumbar spinal stenosis s/p decompression fusion 05/30/2019 per Dr. Kathyrn Sheriff   Admitted with low back pain and radiation and weakness in left leg on 06/25/2019 and subsequently admitted for comprehensive rehab program on 06/28/2019.  Patient was evaluated by neurosurgery this morning and felt that patient is more pale and fatigued and ordered infectious work-up.  Hospitalist consulted for concern of worsening kidney function, drop in H&H and concern for UTI.  She has mild ileus which is noted on abdominal x-ray however patient had regular bowel movement and has been passing gas.  Patient tells me that she is getting better every day, still has painful muscular spasm and nausea and vomiting.  She tells me that she has left flank pain since 1 week and foul-smelling urine which she noticed this morning.  Denies dysuria, hematuria, fever, chills, cough, congestion, runny nose, swelling or redness in bilateral lower extremities, decreased appetite, melena, hematemesis.   She is working very well with physical therapy.  She is currently not on chemical anticoagulation for DVT prophylaxis. She lives with her spouse at home.  Denies smoking, illicit drug use however drinks alcohol 5 times per week.      Review of Systems:  Review of Systems  Constitutional: Negative.   HENT: Negative.   Eyes: Negative.   Respiratory: Negative.     Cardiovascular: Negative.   Gastrointestinal: Negative.   Genitourinary: Negative.   Musculoskeletal: Positive for back pain and joint pain. Negative for falls, myalgias and neck pain.  Skin: Negative.   Neurological: Positive for dizziness, tingling, tremors, sensory change, speech change, seizures, loss of consciousness, weakness and headaches. Negative for focal weakness.  Endo/Heme/Allergies: Negative.   Psychiatric/Behavioral: Negative.      Past Medical History: Past Medical History:  Diagnosis Date  . AIN (anal intraepithelial neoplasia) anal canal   . Anemia    with pregnancy  . Anxiety   . Arthritis    knees, lower back, knees  . Chronic constipation   . Chronic diastolic CHF (congestive heart failure) ([place]) 07/05/2019  . CKD (chronic kidney disease)   . Coarse tremors    Essential  . COPD (chronic obstructive pulmonary disease) ([place])    2017 chest CT, history of  . Degenerative lumbar spinal stenosis   . Depression   . Depression 07/05/2019  . Drug dependency ([place])   . Elevated PTHrP level 05/09/2019  . ETOH abuse 07/05/2019  . GERD (gastroesophageal reflux disease)    pt denies  . Gout   . Grade I diastolic dysfunction 0000000   Noted ECHO  . Hemorrhoids   . History of adenomatous polyp of colon   . History of basal cell carcinoma (BCC) excision 2015   nose  . History of sepsis 05/14/2014   post lumbar surgery  . History of shingles    x2  . Hyperlipidemia   . Hypertension   . Hypothyroidism   . Irregular heart  beat    history of while undergoing radiation  . Malignant neoplasm of upper-inner quadrant of left breast in female, estrogen receptor positive [facility]) oncologist-  dr Jana Hakim--  per lov in epic no recurrence   dx 07-13-2015  Left breast invasive ductal carcinoma, Stage IA, Grade 1 (TXN0),  09-16-2015  s/p  left breast lumpectomy with sln bx's,  completed radiation 11-18-2015,  started antiestrogen therapy 12-14-2015  . Neuropathy    Left foot  and back of left leg  . Obesity (BMI 30-39.9) 07/05/2019  . Personal history of radiation therapy    completed 11-18-2015  left breast  . Pneumonia   . PONV (postoperative nausea and vomiting)   . Prediabetes    diet controlled, no med  . Restless leg syndrome   . Seasonal allergies   . Seizure ([place]) 05/2015   due to sepsis, only one time episode  . Tremor   . Wears partial dentures    lower     Past Surgical History: Past Surgical History:  Procedure Laterality Date  . ABDOMINAL HYSTERECTOMY  1985  . BASAL CELL CARCINOMA EXCISION  10/15   nose  . BREAST BIOPSY Right 08/24/2015  . BREAST BIOPSY Right 07/13/2015  . BREAST BIOPSY Left 07/13/2015  . BREAST EXCISIONAL BIOPSY Left 1972  . BREAST LUMPECTOMY Left   . BREAST LUMPECTOMY WITH RADIOACTIVE SEED AND SENTINEL LYMPH NODE BIOPSY Left 09/16/2015   Procedure: BREAST LUMPECTOMY WITH RADIOACTIVE SEED AND SENTINEL LYMPH NODE BIOPSY;  Surgeon: Stark Klein, MD;  Location: [facility];  Service: General;  Laterality: Left;  . BREAST REDUCTION SURGERY Bilateral 1998  . CATARACT EXTRACTION W/ INTRAOCULAR LENS IMPLANT Left 2016  . [place]   hallo  . COLONOSCOPY  01/2013   polyps  . EYE SURGERY Left 2016  . HAND SURGERY Left 02-19-2002   dr Fredna Dow  @[facility]    repair collateral ligament/ MPJ left thumb  . [place]  2008  . HIGH RESOLUTION ANOSCOPY N/A 02/28/2018   Procedure: HIGH RESOLUTION ANOSCOPY WITH BIOPSY;  Surgeon: Leighton Ruff, MD;  Location[facility];  Service: General;  Laterality: N/A;  . KNEE ARTHROSCOPY Right 2002  . LUMBAR SPINE SURGERY  03-20-2015   dr Kathyrn Sheriff   fusion L4-5, L5-S1  . MOUTH SURGERY  07/14/2018   2 infected teeth removed  . MULTIPLE TOOTH EXTRACTIONS     with bone grafting lower bottom right and left  . REDUCTION MAMMAPLASTY Bilateral   . RIGHT COLECTOMY  09-10-2003   dr Margot Chimes @[facility]    multiple colon polyps (per path tubular adenoma's,  hyperplastic , benign appendix, benign two lymph nodes  . ROTATOR CUFF REPAIR Left 04/2016  . TONSILLECTOMY  1969  . TUBAL LIGATION Bilateral 1982     Allergies:   Allergies  Allergen Reactions  . Ampicillin Hives and Other (See Comments)    Severe reaction in February 2017 1.5 month to have hives to go away  . Penicillins Hives and Other (See Comments)    Severe reaction in February 2017 1.5 month to have hives to go away Has patient had a PCN reaction causing immediate rash, facial/tongue/throat swelling, SOB or lightheadedness with hypotension: #  #  #  YES  #  #  #  Has patient had a PCN reaction causing severe rash involving mucus membranes or skin necrosis: No Has patient had a PCN reaction that required hospitalization No Has patient had a PCN reaction occurring within the last  10 years: No   . Gabapentin Swelling    UNSPECIFIED REACTION   . Other Itching    UNSPECIFIED Analgesics  . Codeine Nausea And Vomiting  . Hydrocodone-Acetaminophen Itching  . Hydromorphone Itching    Pt has taken w/o issues     Social History:  reports that she quit smoking about 13 months ago. Her smoking use included cigarettes. She has a 17.50 pack-year smoking history. She has never used smokeless tobacco. She reports current alcohol use. She reports that she does not use drugs.   Family History: Family History  Problem Relation Age of Onset  . Atrial fibrillation Mother   . Ulcers Father   . Breast cancer Maternal Aunt   . Lung cancer Maternal Uncle   . Stomach cancer Maternal Grandmother   . Lung cancer Maternal Aunt   . Brain cancer Maternal Aunt   . Prostate cancer Maternal Grandfather   . Stroke Paternal Grandmother   . Tuberculosis Paternal Grandfather   . Colon cancer Neg Hx   . Esophageal cancer Neg Hx   . Rectal cancer Neg Hx     Unacceptable: Noncontributory, unremarkable, or negative. Acceptable: Family history reviewed and not pertinent (If you reviewed  it)   Procedures/Significant Events:  Lumbar spinal stenosis s/p decompression fusion 05/30/2019 per Dr. Kathyrn Sheriff  ___________________________________________________________________________________-     I have personally reviewed and interpreted all radiology studies and my findings are as above.  VENTILATOR SETTINGS:   Cultures 3/18 RIGHT AC NGTD x2 3/18 urine positive multiple cultures 3/19 urine pending   Antimicrobials: Anti-infectives (From admission, onward)   Start     Dose/Rate Stop   07/04/19 2000  ciprofloxacin (CIPRO) tablet 250 mg     250 mg 07/07/19 1959   07/04/19 1315  sulfamethoxazole-trimethoprim (BACTRIM) 400-80 MG per tablet 1 tablet  Status:  Discontinued     1 tablet 07/04/19 1313       Devices    LINES / TUBES:      Continuous Infusions: . sodium chloride 100 mL/hr at 07/06/19 0400     Physical Exam: Vitals:   07/05/19 1736 07/05/19 2023 07/05/19 2225 07/06/19 0417  BP:  (!) 179/74 (!) 145/65 (!) 161/95  Pulse:  (!) 101 89 (!) 105  Resp:  19  19  Temp: 98.7 F (37.1 C) 98.8 F (37.1 C)  98.2 F (36.8 C)  TempSrc: Oral Oral  Oral  SpO2:  94%  96%  Weight:      Height:        General: A/O x4, no acute respiratory distress Eyes: negative scleral hemorrhage, negative anisocoria, negative icterus ENT: Negative Runny nose, negative gingival bleeding, Neck:  Negative scars, masses, torticollis, lymphadenopathy, JVD Lungs: Clear to auscultation bilaterally without wheezes or crackles Cardiovascular: Regular rate and rhythm without murmur gallop or rub normal S1 and S2 Abdomen: Obese, negative abdominal pain, nondistended, positive soft, bowel sounds, no rebound, no ascites, no appreciable mass Extremities: No significant cyanosis, clubbing, or edema bilateral lower extremities Skin: Negative rashes, lesions, ulcers Psychiatric:  Negative depression, negative anxiety, negative fatigue, negative mania  Central nervous system:   Cranial nerves II through XII intact, tongue/uvula midline, all extremities muscle strength 5/5, except LEFT lower extremity strength 2-3/5, sensation intact throughout, negative dysarthria, negative expressive aphasia, negative receptive aphasia.  Data reviewed:  I have personally reviewed following labs and imaging studies Labs:  CBC: Recent Labs  Lab 07/01/19 0508 07/04/19 1008 07/04/19 1353 07/05/19 0428 07/06/19 0622  WBC 10.9* 11.4*  --  9.9 8.4  NEUTROABS 7.2 8.3*  --  6.8  --   HGB 10.2* 8.2*  --  8.0* 8.1*  HCT 32.5* 25.8* 20.4* 25.6* 25.4*  MCV 100.0 97.4  --  99.2 98.4  PLT 444* 411*  --  380 [id number]    Basic Metabolic Panel: Recent Labs  Lab 07/01/19 0508 07/01/19 0508 07/04/19 1008 07/04/19 1008 07/05/19 0428 07/05/19 0948 07/06/19 0622  NA 138  --  130*  --  131*  --  134*  K 4.6   < > 4.9   < > 4.1  --  4.3  CL 101  --  98  --  99  --  103  CO2 25  --  22  --  20*  --  22  GLUCOSE 177*  --  193*  --  175*  --  148*  BUN 26*  --  36*  --  34*  --  16  CREATININE 1.05*  --  1.76*  --  1.64*  --  0.95  CALCIUM 9.8  --  9.1  --  8.9  --  8.9  MG  --   --   --   --   --  2.5* 2.0  PHOS  --   --   --   --   --  4.0 3.0   < > = values in this interval not displayed.   GFR Estimated Creatinine Clearance: 70.9 mL/min (by C-G formula based on SCr of 0.95 mg/dL). Liver Function Tests: Recent Labs  Lab 07/01/19 0508 07/04/19 1008 07/05/19 0428 07/06/19 0622  AST 17 25 26 24   ALT 27 31 32 32  ALKPHOS 92 92 86 85  BILITOT 0.6 0.6 0.5 0.4  PROT 6.0* 5.7* 5.5* 5.4*  ALBUMIN 2.8* 2.5* 2.4* 2.3*   No results for input(s): LIPASE, AMYLASE in the last 168 hours. No results for input(s): AMMONIA in the last 168 hours. Coagulation profile No results for input(s): INR, PROTIME in the last 168 hours.  Cardiac Enzymes: No results for input(s): CKTOTAL, CKMB, CKMBINDEX, TROPONINI in the last 168 hours. BNP: Invalid input(s): POCBNP CBG: No results for input(s):  GLUCAP in the last 168 hours. D-Dimer No results for input(s): DDIMER in the last 72 hours. Hgb A1c No results for input(s): HGBA1C in the last 72 hours. Lipid Profile No results for input(s): CHOL, HDL, LDLCALC, TRIG, CHOLHDL, LDLDIRECT in the last 72 hours. Thyroid function studies No results for input(s): TSH, T4TOTAL, T3FREE, THYROIDAB in the last 72 hours.  Invalid input(s): FREET3 Anemia work up Recent Labs    07/04/19 1353  VITAMINB12 1,594*  FERRITIN 309*  TIBC 262  IRON 23*   Urinalysis    Component Value Date/Time   COLORURINE AMBER (A) 07/04/2019 1300   APPEARANCEUR CLOUDY (A) 07/04/2019 1300   LABSPEC 1.026 07/04/2019 1300   LABSPEC 1.025 06/24/2019 1426   PHURINE 5.0 07/04/2019 1300   GLUCOSEU NEGATIVE 07/04/2019 1300   HGBUR NEGATIVE 07/04/2019 1300   BILIRUBINUR MODERATE (A) 07/04/2019 1300   BILIRUBINUR moderate (A) 06/24/2019 1426   BILIRUBINUR n 11/25/2015 1712   KETONESUR NEGATIVE 07/04/2019 1300   PROTEINUR NEGATIVE 07/04/2019 1300   UROBILINOGEN negative 11/25/2015 1712   NITRITE NEGATIVE 07/04/2019 1300   LEUKOCYTESUR MODERATE (A) 07/04/2019 1300     Microbiology Recent Results (from the past 240 hour(s))  Culture, Urine     Status: Abnormal   Collection Time: 07/04/19  9:27 AM   Specimen: Urine, Random  Result  Value Ref Range Status   Specimen Description URINE, RANDOM  Final   Special Requests   Final    NONE Performed at [facility], [place] [address] [place], [place] [address]    Culture MULTIPLE SPECIES PRESENT, SUGGEST RECOLLECTION (A)  Final   Report Status 07/05/2019 FINAL  Final  Culture, blood (routine x 2)     Status: None (Preliminary result)   Collection Time: 07/04/19  1:53 PM   Specimen: BLOOD  Result Value Ref Range Status   Specimen Description BLOOD RIGHT ANTECUBITAL  Final   Special Requests   Final    BOTTLES DRAWN AEROBIC AND ANAEROBIC Blood Culture adequate volume   Culture   Final    NO GROWTH 2  DAYS Performed at [facility], [place] [address] [place], [place] [address]    Report Status PENDING  Incomplete  Culture, blood (routine x 2)     Status: None (Preliminary result)   Collection Time: 07/04/19  1:53 PM   Specimen: BLOOD  Result Value Ref Range Status   Specimen Description BLOOD RIGHT ANTECUBITAL  Final   Special Requests   Final    BOTTLES DRAWN AEROBIC AND ANAEROBIC Blood Culture adequate volume   Culture   Final    NO GROWTH 2 DAYS Performed at [facility], [place] [address] [place], [place] [address]    Report Status PENDING  Incomplete       Inpatient Medications:   Scheduled Meds: . allopurinol  300 mg Oral Daily  . alum & mag hydroxide-simeth  15 mL Oral TID  . anastrozole  1 mg Oral Daily  . atorvastatin  20 mg Oral q1800  . ciprofloxacin  250 mg Oral BID  . diclofenac Sodium  4 g Topical QID  . docusate sodium  100 mg Oral TID  . levothyroxine  75 mcg Oral Q0600  . magnesium gluconate  1,000 mg Oral QHS  . Melatonin  10.5 mg Oral QHS  . metaxalone  800 mg Oral TID  . methocarbamol  1,000 mg Oral TID  . morphine  15 mg Oral Q12H  . nystatin  5 mL Oral QID  . polyethylene glycol  17 g Oral BID  . primidone  50 mg Oral QHS  . QUEtiapine  25 mg Oral QHS  . rOPINIRole  0.75 mg Oral QHS  . senna  2 tablet Oral QHS  . sodium chloride flush  10-40 mL Intracatheter Q12H  . vitamin B-12  1,000 mcg Oral Daily   Continuous Infusions: . sodium chloride 100 mL/hr at 07/06/19 0400     Radiological Exams on Admission: US RENAL  Result Date: 07/04/2019 CLINICAL DATA:  Pain at LEFT flank for 2 weeks, UTI EXAM: RENAL / URINARY TRACT ULTRASOUND COMPLETE COMPARISON:  None FINDINGS: Right Kidney: Renal measurements: 11.2 x 4.7 x 7.5 cm = volume: 206 mL. Mild cortical thinning. Normal cortical echogenicity. No mass, hydronephrosis, or shadowing calcification. Left Kidney: Renal measurements: 10.5 x 6.2 x 6.1 cm = volume: 208 mL. Mild cortical thinning.  Normal cortical echogenicity. Small cyst at inferior pole 2.2 x 1.9 x 1.4 cm. No additional mass, hydronephrosis or shadowing calcification. Bladder: Appears normal for degree of bladder distention. Other: Echogenic hepatic parenchyma, likely reflecting fatty infiltration, without obvious nodularity or mass to suggest cirrhosis. IMPRESSION: LEFT renal cyst 2.2 cm greatest size. No other renal sonographic abnormalities identified. Suspected fatty infiltration of liver. Electronically Signed   By: Crist Infante.D.  On: 07/04/2019 16:55   DG Abd Portable 1V  Result Date: 07/06/2019 CLINICAL DATA:  Ileus EXAM: PORTABLE ABDOMEN - 1 VIEW COMPARISON:  July 01, 2019 FINDINGS: The lung bases are normal. No free air, portal venous gas, or pneumatosis. Air seen throughout the length of the colon. No convincing evidence of obstruction or ileus. No other acute abnormalities. IMPRESSION: No convincing evidence of ileus or obstruction. No acute abnormalities are identified. Electronically Signed   By: Dorise Bullion III M.D   On: 07/06/2019 09:35    Impression/Recommendations Principal Problem:   Spinal stenosis of lumbar region Active Problems:   Mixed hyperlipidemia   Essential hypertension   COPD (chronic obstructive pulmonary disease) (H[facility])   Malignant neoplasm of upper-inner quadrant of left breast in female, estrogen receptor positive ([place])   PVC's (premature ventricular contractions)   RLS (restless legs syndrome)   Hypothyroidism   Lumbar spinal stenosis   Weakness of left lower extremity   Acute on chronic anemia   AKI (acute kidney injury) ([place])   Anxiety   Depression   Chronic diastolic CHF (congestive heart failure) ([facility])   Obesity (BMI 30-39.9)   ETOH abuse  Acute on CKD stage IIIb (baseline Cr 1.18 06/27/2019) Recent Labs  Lab 07/01/19 0508 07/04/19 1008 07/05/19 0428 07/06/19 0622  CREATININE 1.05* 1.76* 1.64* 0.95  -N/V resolved, continue hydration normal saline 170ml/hr -3/20  restart Cymbalta as renal function has normalized.   -Hold nephrotoxic medication -3/18 renal ultrasound nondiagnostic see results above  UTI -UA is positive for leukocytes and bacteria.  Left flank pain resolved -Leukocytosis resolved -Continue Cipro 250 BID x5 days -Urine/blood culture pending  Essential HTN -Hold BP meds restart if BP trends up  Mild ileus -3/19 patient not complaining of abdominal pain t -3/20 KUB pending  Hypothyroidism:  -TSH: WNL checked on 03/18/2019 -Levothyroxine 75 mcg daily  Acute on chronic anemia: (Baseline HgB 9.1) Recent Labs  Lab 07/01/19 0508 07/04/19 1008 07/05/19 0428 07/06/19 0622  HGB 10.2* 8.2* 8.0* 8.1*  -Anemia panel pending -Occult blood pending  Hyponatremia -Asymptomatic -Monitor closely if continues to decline may need to DC psychiatric medication -3/20 resolving we will hold on discontinuing patient's psychiatric medication.  Muscle spasm -Robaxin -Metaxalone  Restless leg syndrome -Primidone -Requip  Thrombocytosis -Most likely reactive resolving continue to monitor  HLD -Lipitor 20 mg daily  HX LEFT breast cancer -S/p mastectomy, XRT -Continue Arimidex 1 mg daily  Gout -Allopurinol 300 mg daily -Uric acid level pending  Spinal stenosis of LUMBAR spine region -Status post recent L2-L3 fusion on 05/30/2019 -Neurosurgery and PT is following.  Thank you for this consultation.  Our [facility] hospitalist team will follow the patient with you.      Time Spent: 30-minute  Geisha Abernathy, Geraldo Docker M.D. [facility] 07/06/2019, 11:27 AM  [id number]

## 2019-07-07 ENCOUNTER — Inpatient Hospital Stay (HOSPITAL_COMMUNITY): Payer: PPO

## 2019-07-07 LAB — COMPREHENSIVE METABOLIC PANEL
ALT: 29 U/L (ref 0–44)
AST: 24 U/L (ref 15–41)
Albumin: 2.2 g/dL — ABNORMAL LOW (ref 3.5–5.0)
Alkaline Phosphatase: 90 U/L (ref 38–126)
Anion gap: 8 (ref 5–15)
BUN: 7 mg/dL — ABNORMAL LOW (ref 8–23)
CO2: 22 mmol/L (ref 22–32)
Calcium: 8.9 mg/dL (ref 8.9–10.3)
Chloride: 110 mmol/L (ref 98–111)
Creatinine, Ser: 0.96 mg/dL (ref 0.44–1.00)
GFR calc Af Amer: >60 mL/min (ref >60)
GFR calc non Af Amer: 59 mL/min — ABNORMAL LOW (ref >60)
Glucose, Bld: 136 mg/dL — ABNORMAL HIGH (ref 70–99)
Potassium: 4 mmol/L (ref 3.5–5.1)
Sodium: 140 mmol/L (ref 135–145)
Total Bilirubin: 0.3 mg/dL (ref 0.3–1.2)
Total Protein: 5.5 g/dL — ABNORMAL LOW (ref 6.5–8.1)

## 2019-07-07 LAB — CBC
HCT: 26.7 % — ABNORMAL LOW (ref 36.0–46.0)
Hemoglobin: 8.3 g/dL — ABNORMAL LOW (ref 12.0–15.0)
MCH: 31.2 pg (ref 26.0–34.0)
MCHC: 31.1 g/dL (ref 30.0–36.0)
MCV: 100.4 fL — ABNORMAL HIGH (ref 80.0–100.0)
Platelets: 370 10*3/uL (ref 150–400)
RBC: 2.66 MIL/uL — ABNORMAL LOW (ref 3.87–5.11)
RDW: 14.4 % (ref 11.5–15.5)
WBC: 7.6 10*3/uL (ref 4.0–10.5)
nRBC: 0 % (ref 0.0–0.2)

## 2019-07-07 LAB — PHOSPHORUS: Phosphorus: 2.5 mg/dL (ref 2.5–4.6)

## 2019-07-07 LAB — MAGNESIUM: Magnesium: 1.9 mg/dL (ref 1.7–2.4)

## 2019-07-07 LAB — URINE CULTURE: Culture: 20000 — AB

## 2019-07-07 NOTE — Progress Notes (Signed)
Physical Therapy Session Note  Patient Details  Name: Brandi Stephens MRN: [id number] Date of Birth: 10/30/1945  Today's Date: 07/07/2019 PT Individual Time: 0803-0900 PT Individual Time Calculation (min): 57 min   Short Term Goals: Week 2:  PT Short Term Goal 1 (Week 2): = to LTGs based on ELOS  Skilled Therapeutic Interventions/Progress Updates:    Pt supine in bed upon PT arrival, agreeable to therapy tx and reports pain 7/10 in low back and radiating through left hip with intermittent spasms - heat applied and repositioning for pain relief. Intermittent nausea during session as well, RN in/out to provide medications. Began session with diaphragmatic breathing working on relaxation techniques, cues for deep breaths, focus on easing muscle spasms. Pt does report feeling better than yesterday and agreeable to OOB activity. Pt transferred to sitting EOB with increased time, supervision and use of bedrails to roll onto R side then push up to sitting EOB. Pt sits EOB x 2 minutes while focusing on breathing techniques for pain control. Pt ambulated in/out of bathroom this session 2 x 10 ft with RW and supervision, pt continent of bowel and bladder this session, assist for pericare. Pt seated EOB reports she needs to lay down secondary to pain, spasms, tremors. Pt transferred to supine min assist with log roll techniques. Pt asking about bedrails for home, therapist showed patient image of bedrail that could be ordered online to slide under mattress for bedrail. Distraction used throughout session (talking about non-related topics) in order to minimize patients focus on pain. While using distraction, pt performed 2 x 10 active assisted L heel slides and 2 x 10 sidelying active assisted L hip clamshells for strengthening, cues for techniques. Therapist performed gentle soft tissue release to L piriformis. Pt also worked on rolling this session with min assist in both directions. Discussed starting next  session with bed exercises and then progressing to gait training and stairs this week, pt agreeable. Pt left supine in bed at end of session with needs in reach and bed alarm set.   Therapy Documentation Precautions:  Precautions Precautions: Back, Fall Precaution Comments: verbally reviewed back precautions.  Restrictions Weight Bearing Restrictions: No  Therapy/Group: Individual Therapy  Netta Corrigan, PT, DPT, CSRS 07/07/2019, 8:12 AM

## 2019-07-07 NOTE — Progress Notes (Addendum)
[facility] PHYSICAL MEDICINE & REHABILITATION PROGRESS NOTE   Subjective/Complaints:  Appreciate IM and Nursing note from 3/20 Wrorking with PT and ambulated well, also better wit bed mobility Discussed that spasms are aggravatede by anxiety, also discussed that this was a "big surgery" improvement will be gradual and meds can only provide a partial (<50%) relief of spasms and pain   ROS:  Pt denies SOB, CP, and vision changes   Objective:   DG Abd Portable 1V  Result Date: 07/06/2019 CLINICAL DATA:  Ileus EXAM: PORTABLE ABDOMEN - 1 VIEW COMPARISON:  July 01, 2019 FINDINGS: The lung bases are normal. No free air, portal venous gas, or pneumatosis. Air seen throughout the length of the colon. No convincing evidence of obstruction or ileus. No other acute abnormalities. IMPRESSION: No convincing evidence of ileus or obstruction. No acute abnormalities are identified. Electronically Signed   By: Dorise Bullion III M.D   On: 07/06/2019 09:35   Recent Labs    07/06/19 0622 07/07/19 0546  WBC 8.4 7.6  HGB 8.1* 8.3*  HCT 25.4* 26.7*  PLT 374 370   Recent Labs    07/06/19 0622 07/07/19 0546  NA 134* 140  K 4.3 4.0  CL 103 110  CO2 22 22  GLUCOSE 148* 136*  BUN 16 7*  CREATININE 0.95 0.96  CALCIUM 8.9 8.9    Intake/Output Summary (Last 24 hours) at 07/07/2019 0834 Last data filed at 07/07/2019 0600 Gross per 24 hour  Intake 2775.21 ml  Output --  Net 2775.21 ml     Physical Exam: Vital Signs Blood pressure 124/76, pulse 81, temperature 98.5 F (36.9 C), resp. rate 18, height 5\' 8"  (1.727 m), weight 114 kg, SpO2 96 %. Vitals reviewed. Constitutional: Better energy this morning.  HENT:  Head: conjugate gaze CV: RRR; no JVD  Respiratory: CTA B/L- good air movement GI: soft, NT, ND, (+)BS- TTP over LLQ near groin but still in abdomen- no rebound- nothing palpated per se.  Musculoskeletal:     Comments: No edema or tenderness in extremities  Neurological:  Ox3 Cooperative with exam. Generalized tremor noted Motor: Bilateral upper extremities: 4-4+/5 proximal distal Bilateral lower extremities: 4-4+/5 proximal distal Sensation diminished to light touch distal extremities  Continuing to have spasms.  Skin:  Back incision well healing from lumbar to sacrum- no drainage or erythema Psychiatric: talkative; appropriate.    Assessment/Plan: 1. Functional deficits secondary to acute on chronic back pain S/P recent L2-L3 fusion 05/30/2019. which require 3+ hours per day of interdisciplinary therapy in a comprehensive inpatient rehab setting.  Physiatrist is providing close team supervision and 24 hour management of active medical problems listed below.  Physiatrist and rehab team continue to assess barriers to discharge/monitor patient progress toward functional and medical goals  Care Tool:  Bathing    Body parts bathed by patient: Right arm, Left arm, Chest, Abdomen, Face, Front perineal area, Right upper leg, Left upper leg   Body parts bathed by helper: Buttocks, Left lower leg, Right lower leg Body parts n/a: Front perineal area, Buttocks, Right upper leg, Left upper leg, Right lower leg   Bathing assist Assist Level: Moderate Assistance - Patient 50 - 74%     Upper Body Dressing/Undressing Upper body dressing Upper body dressing/undressing activity did not occur (including orthotics): N/A What is the patient wearing?: Bra, Pull over shirt    Upper body assist Assist Level: Set up assist    Lower Body Dressing/Undressing Lower body dressing  What is the patient wearing?: Pants, Underwear/pull up     Lower body assist Assist for lower body dressing: Moderate Assistance - Patient 50 - 74%     Toileting Toileting    Toileting assist Assist for toileting: Minimal Assistance - Patient > 75% Assistive Device Comment: (walker)   Transfers Chair/bed transfer  Transfers assist     Chair/bed transfer assist level:  Supervision/Verbal cueing Chair/bed transfer assistive device: Programmer, multimedia   Ambulation assist      Assist level: Contact Guard/Touching assist Assistive device: Walker-rolling Max distance: 58ft   Walk 10 feet activity   Assist     Assist level: Contact Guard/Touching assist Assistive device: Walker-rolling   Walk 50 feet activity   Assist    Assist level: Contact Guard/Touching assist Assistive device: Walker-rolling    Walk 150 feet activity   Assist    Assist level: Contact Guard/Touching assist Assistive device: Walker-rolling    Walk 10 feet on uneven surface  activity   Assist     Assist level: Minimal Assistance - Patient > 75% Assistive device: Aeronautical engineer Will patient use wheelchair at discharge?: No             Wheelchair 50 feet with 2 turns activity    Assist            Wheelchair 150 feet activity     Assist          Blood pressure 124/76, pulse 81, temperature 98.5 F (36.9 C), resp. rate 18, height 5\' 8"  (1.727 m), weight 114 kg, SpO2 96 %.  Medical Problem List and Plan: 1.  Decreased functional mobility secondary to spinal stenosis S/P recent L2-L3 fusion 05/30/2019.  Decadron taper per neurosurgery             -patient may shower after covering incision             -ELOS/Goals: 8-13 days/supervision/min a            -Continue CIR PT< OT 2.  Antithrombotics: -DVT/anticoagulation: SCDs.               Vascular study ordered             -antiplatelet therapy: N/A 3. Pain Management: Voltaren gel 4 times daily, Cymbalta 60 mg daily, Robaxin-750 mig 3 times daily, MS Contin 15 mg every 12 hours, oxycodone as needed for breakthrough pain  3/13- increase Robaxin up to 1000 mg TID for spasms since they are biggest issue  Stop Skelaxin, add low scheduled dose of tizanidine with hold for sedation Pt very anxious , may consider low dose valium at least at noc if  tizanidine schedule is not helpful , may be having some self limiting behavior as well  4. Mood: Provide emotional support             -antipsychotic agents: N/A  3/19: Patient would like to start medication for anxiety and insomnia. Will start Seroquel 25mg  HS.  5. Neuropsych: This patient is capable of making decisions on her own behalf. 6. Skin/Wound Care: Routine skin checks 7. Fluids/Electrolytes/Nutrition: Routine in and outs.  CMP ordered 8.  Acute on chronic anemia.  CBC ordered 9.  AKI on CKD stage III.  CMP ordered IM on consult 10.  Hypertension.  Toprol-XL 12.5 mg daily.               Monitor with increased mobility  IM on consult  11.  Obesity.  BMI 38.67.  Dietary follow-up 12.  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  Follow-up outpatient Dr. Jana Hakim.  Continue Armidex 13.  Hyperlipidemia.  Lipitor 14.  History of gout.  Continue Zyloprim. 15.  Hypothyroidism.  TSH 1.420.  Continue Synthroid 16.  Chronic constipation.  Adjust bowel meds as necessary  3/14- on miralax BID- see if can have BM- could be cause of LLQ pain.   3/15: Increase Colace to TID. Refuses enema or mag citrate at this time.   3/16: Added senna HS.   3/17: Still no BM since 3/12. She is willing to take a suppository today.  3/18: see below.  17.  COPD/quit smoking 13 months ago.  Check oxygen saturations every shift  3/18: Apparently she has been sneaking cigarettes to smoke during the day. Will discuss further with patient and counsel.  IM on consult  18.  Tremors with restless leg syndrome.  Continue Requip as well as Mysoline  19. Hyperkalemia  3/13- Last K+ 5.1- will recheck Monday.   3/15: stable at 4.6  3/19: stable at 4.1 20. LLQ pain- is near groin, but still in abdomen- biggest source of pain except back spasms. -If not improved with BM, suggest U/S or CT.   3/15: KUB to assess for kidney stone vs. Constipation  3/16: KUB reviewed and discussed with patient: shows  mild ileus and gas. Patient has multiple risk factors for ileus, including impaired mobility, obesity, hyperkalemia, hypothyroidism. She is already not eating much. Prescribed Mylanta for gas.   3/17: She has not requested Mylanta; will schedule  3/18: Had BM and is passing gas better now.   3/19: Renal US personally reviewed and shows no evidence of hydronephrosis. KUB ordered per IM - no evidence of ileus IM on COnsult  21. Fatigue/pallor: Appreciate NSGY follow-up. Infection workup ordered and pending. Appears more fatigued than yesterday but also in less pain and better able to tolerate therapy; will maintain current muscle relaxers at this time but monitor for sedation.    3/19: Appears much better after receiving fluids. Continue fluids and trend Creatinine, which is improving.  22.  Radiation induced tooth decay/Thursh:   3/19: Nystatin swish and swallow ordered. Daughter will schedule outpatient dentistry follow-up.   LOS: 9 days A FACE TO FACE EVALUATION WAS PERFORMED  Charlett Blake 07/07/2019, 8:34 AM

## 2019-07-07 NOTE — Plan of Care (Signed)
  Problem: RH BOWEL ELIMINATION Goal: RH STG MANAGE BOWEL WITH ASSISTANCE Description: STG Manage Bowel with MOD IAssistance. Outcome: Progressing   Problem: RH BLADDER ELIMINATION Goal: RH STG MANAGE BLADDER WITH ASSISTANCE Description: STG Manage Bladder With MOD I Assistance Outcome: Progressing   Problem: RH PAIN MANAGEMENT Goal: RH STG PAIN MANAGED AT OR BELOW PT'S PAIN GOAL Description: Pt will report less than 3 out of 10 on pain scale with PRN medications Outcome: Progressing

## 2019-07-08 ENCOUNTER — Inpatient Hospital Stay (HOSPITAL_COMMUNITY): Payer: PPO | Admitting: Occupational Therapy

## 2019-07-08 ENCOUNTER — Inpatient Hospital Stay (HOSPITAL_COMMUNITY): Payer: PPO

## 2019-07-08 DIAGNOSIS — E46 Unspecified protein-calorie malnutrition: Secondary | ICD-10-CM

## 2019-07-08 DIAGNOSIS — I10 Essential (primary) hypertension: Secondary | ICD-10-CM

## 2019-07-08 DIAGNOSIS — R7303 Prediabetes: Secondary | ICD-10-CM

## 2019-07-08 DIAGNOSIS — F419 Anxiety disorder, unspecified: Secondary | ICD-10-CM

## 2019-07-08 DIAGNOSIS — R0989 Other specified symptoms and signs involving the circulatory and respiratory systems: Secondary | ICD-10-CM

## 2019-07-08 DIAGNOSIS — Z720 Tobacco use: Secondary | ICD-10-CM

## 2019-07-08 DIAGNOSIS — J431 Panlobular emphysema: Secondary | ICD-10-CM

## 2019-07-08 DIAGNOSIS — E8809 Other disorders of plasma-protein metabolism, not elsewhere classified: Secondary | ICD-10-CM

## 2019-07-08 DIAGNOSIS — D649 Anemia, unspecified: Secondary | ICD-10-CM

## 2019-07-08 LAB — COMPREHENSIVE METABOLIC PANEL
ALT: 36 U/L (ref 0–44)
AST: 32 U/L (ref 15–41)
Albumin: 2.5 g/dL — ABNORMAL LOW (ref 3.5–5.0)
Alkaline Phosphatase: 93 U/L (ref 38–126)
Anion gap: 10 (ref 5–15)
BUN: <5 mg/dL — ABNORMAL LOW (ref 8–23)
CO2: 23 mmol/L (ref 22–32)
Calcium: 9.3 mg/dL (ref 8.9–10.3)
Chloride: 107 mmol/L (ref 98–111)
Creatinine, Ser: 0.8 mg/dL (ref 0.44–1.00)
GFR calc Af Amer: >60 mL/min (ref >60)
GFR calc non Af Amer: >60 mL/min (ref >60)
Glucose, Bld: 135 mg/dL — ABNORMAL HIGH (ref 70–99)
Potassium: 3.6 mmol/L (ref 3.5–5.1)
Sodium: 140 mmol/L (ref 135–145)
Total Bilirubin: 0.4 mg/dL (ref 0.3–1.2)
Total Protein: 6.2 g/dL — ABNORMAL LOW (ref 6.5–8.1)

## 2019-07-08 LAB — PHOSPHORUS: Phosphorus: 2.5 mg/dL (ref 2.5–4.6)

## 2019-07-08 LAB — CBC
HCT: 30.1 % — ABNORMAL LOW (ref 36.0–46.0)
Hemoglobin: 9.4 g/dL — ABNORMAL LOW (ref 12.0–15.0)
MCH: 31.6 pg (ref 26.0–34.0)
MCHC: 31.2 g/dL (ref 30.0–36.0)
MCV: 101.3 fL — ABNORMAL HIGH (ref 80.0–100.0)
Platelets: 422 10*3/uL — ABNORMAL HIGH (ref 150–400)
RBC: 2.97 MIL/uL — ABNORMAL LOW (ref 3.87–5.11)
RDW: 14.5 % (ref 11.5–15.5)
WBC: 10.3 10*3/uL (ref 4.0–10.5)
nRBC: 0.2 % (ref 0.0–0.2)

## 2019-07-08 LAB — MAGNESIUM: Magnesium: 1.9 mg/dL (ref 1.7–2.4)

## 2019-07-08 MED ORDER — PRO-STAT SUGAR FREE PO LIQD
30.0000 mL | Freq: Two times a day (BID) | ORAL | Status: DC
  Administered 2019-07-08 – 2019-07-10 (×4): 30 mL via ORAL
  Filled 2019-07-08 (×7): qty 30

## 2019-07-08 NOTE — Plan of Care (Signed)
  Problem: Consults Goal: RH GENERAL PATIENT EDUCATION Description: See Patient Education module for education specifics. Outcome: Progressing   Problem: RH BOWEL ELIMINATION Goal: RH STG MANAGE BOWEL WITH ASSISTANCE Description: STG Manage Bowel with MOD IAssistance. Outcome: Progressing Goal: RH STG MANAGE BOWEL W/MEDICATION W/ASSISTANCE Description: STG Manage Bowel with Medication with MIN Assistance. Outcome: Progressing   Problem: RH BLADDER ELIMINATION Goal: RH STG MANAGE BLADDER WITH ASSISTANCE Description: STG Manage Bladder With MOD I Assistance Outcome: Progressing   Problem: RH SKIN INTEGRITY Goal: RH STG SKIN FREE OF INFECTION/BREAKDOWN Description: Skin will be free of infection/breakdown while in rehab Outcome: Progressing Goal: RH STG MAINTAIN SKIN INTEGRITY WITH ASSISTANCE Description: STG Maintain Skin Integrity With MIN Assistance. Outcome: Progressing   Problem: RH SAFETY Goal: RH STG ADHERE TO SAFETY PRECAUTIONS W/ASSISTANCE/DEVICE Description: STG Adhere to Safety Precautions With MOD I Assistance/Device. Outcome: Progressing Goal: RH STG DECREASED RISK OF FALL WITH ASSISTANCE Description: STG Decreased Risk of Fall With [place]. Outcome: Progressing   Problem: RH PAIN MANAGEMENT Goal: RH STG PAIN MANAGED AT OR BELOW PT'S PAIN GOAL Description: Pt will report less than 3 out of 10 on pain scale with PRN medications Outcome: Progressing   Problem: RH KNOWLEDGE DEFICIT GENERAL Goal: RH STG INCREASE KNOWLEDGE OF SELF CARE AFTER HOSPITALIZATION Outcome: Progressing

## 2019-07-08 NOTE — Consult Note (Signed)
Medical Consultation   Brandi Stephens  [id number]  DOB: 1945/04/25  DOA: 06/28/2019  PCP: Girtha Rm, NP-C    Requesting physician:   Reason for consultation: Multiple medical problems   History of Present Illness: 74 y.o. WF PMHx Anxiety, Depression, EtOH abuse anemia of pregnancy HTN, LEFT breast cancer s/p lobectomy plus XRT, Hypothyroidism HTN, Chronic Diastolic CHF, arrhythmia, HLD, COPD, former smoker, Obesity with BMI of 38, restless leg syndrome, chronic constipation, Drug dependency, Elevated PTHrP level CKD stage III, lumbar spinal stenosis s/p decompression fusion 05/30/2019 per Dr. Kathyrn Sheriff   Admitted with low back pain and radiation and weakness in left leg on 06/25/2019 and subsequently admitted for comprehensive rehab program on 06/28/2019.  Patient was evaluated by neurosurgery this morning and felt that patient is more pale and fatigued and ordered infectious work-up.  Hospitalist consulted for concern of worsening kidney function, drop in H&H and concern for UTI.  She has mild ileus which is noted on abdominal x-ray however patient had regular bowel movement and has been passing gas.  Patient tells me that she is getting better every day, still has painful muscular spasm and nausea and vomiting.  She tells me that she has left flank pain since 1 week and foul-smelling urine which she noticed this morning.  Denies dysuria, hematuria, fever, chills, cough, congestion, runny nose, swelling or redness in bilateral lower extremities, decreased appetite, melena, hematemesis.   She is working very well with physical therapy.  She is currently not on chemical anticoagulation for DVT prophylaxis. She lives with her spouse at home.  Denies smoking, illicit drug use however drinks alcohol 5 times per week.      Review of Systems:  Review of Systems  Constitutional: Negative.   HENT: Negative.   Eyes: Negative.   Respiratory: Negative.     Cardiovascular: Negative.   Gastrointestinal: Negative.   Genitourinary: Negative.   Musculoskeletal: Positive for back pain and joint pain. Negative for falls, myalgias and neck pain.  Skin: Negative.   Neurological: Positive for dizziness, tingling, tremors, sensory change, speech change, seizures, loss of consciousness, weakness and headaches. Negative for focal weakness.  Endo/Heme/Allergies: Negative.   Psychiatric/Behavioral: Negative.      Past Medical History: Past Medical History:  Diagnosis Date  . AIN (anal intraepithelial neoplasia) anal canal   . Anemia    with pregnancy  . Anxiety   . Arthritis    knees, lower back, knees  . Chronic constipation   . Chronic diastolic CHF (congestive heart failure) ([place]) 07/05/2019  . CKD (chronic kidney disease)   . Coarse tremors    Essential  . COPD (chronic obstructive pulmonary disease) ([place])    2017 chest CT, history of  . Degenerative lumbar spinal stenosis   . Depression   . Depression 07/05/2019  . Drug dependency ([place])   . Elevated PTHrP level 05/09/2019  . ETOH abuse 07/05/2019  . GERD (gastroesophageal reflux disease)    pt denies  . Gout   . Grade I diastolic dysfunction 0000000   Noted ECHO  . Hemorrhoids   . History of adenomatous polyp of colon   . History of basal cell carcinoma (BCC) excision 2015   nose  . History of sepsis 05/14/2014   post lumbar surgery  . History of shingles    x2  . Hyperlipidemia   . Hypertension   . Hypothyroidism   . Irregular heart  beat    history of while undergoing radiation  . Malignant neoplasm of upper-inner quadrant of left breast in female, estrogen receptor positive [facility]) oncologist-  dr Jana Hakim--  per lov in epic no recurrence   dx 07-13-2015  Left breast invasive ductal carcinoma, Stage IA, Grade 1 (TXN0),  09-16-2015  s/p  left breast lumpectomy with sln bx's,  completed radiation 11-18-2015,  started antiestrogen therapy 12-14-2015  . Neuropathy    Left foot  and back of left leg  . Obesity (BMI 30-39.9) 07/05/2019  . Personal history of radiation therapy    completed 11-18-2015  left breast  . Pneumonia   . PONV (postoperative nausea and vomiting)   . Prediabetes    diet controlled, no med  . Restless leg syndrome   . Seasonal allergies   . Seizure ([place]) 05/2015   due to sepsis, only one time episode  . Tremor   . Wears partial dentures    lower     Past Surgical History: Past Surgical History:  Procedure Laterality Date  . ABDOMINAL HYSTERECTOMY  1985  . BASAL CELL CARCINOMA EXCISION  10/15   nose  . BREAST BIOPSY Right 08/24/2015  . BREAST BIOPSY Right 07/13/2015  . BREAST BIOPSY Left 07/13/2015  . BREAST EXCISIONAL BIOPSY Left 1972  . BREAST LUMPECTOMY Left   . BREAST LUMPECTOMY WITH RADIOACTIVE SEED AND SENTINEL LYMPH NODE BIOPSY Left 09/16/2015   Procedure: BREAST LUMPECTOMY WITH RADIOACTIVE SEED AND SENTINEL LYMPH NODE BIOPSY;  Surgeon: Stark Klein, MD;  Location: [place] [facility];  Service: General;  Laterality: Left;  . BREAST REDUCTION SURGERY Bilateral 1998  . CATARACT EXTRACTION W/ INTRAOCULAR LENS IMPLANT Left 2016  . [place]   hallo  . COLONOSCOPY  01/2013   polyps  . EYE SURGERY Left 2016  . HAND SURGERY Left 02-19-2002   dr Fredna Dow  @[facility]    repair collateral ligament/ MPJ left thumb  . [place]  2008  . HIGH RESOLUTION ANOSCOPY N/A 02/28/2018   Procedure: HIGH RESOLUTION ANOSCOPY WITH BIOPSY;  Surgeon: Leighton Ruff, MD;  Location: [facility];  Service: General;  Laterality: N/A;  . KNEE ARTHROSCOPY Right 2002  . LUMBAR SPINE SURGERY  03-20-2015   dr Kathyrn Sheriff   fusion L4-5, L5-S1  . MOUTH SURGERY  07/14/2018   2 infected teeth removed  . MULTIPLE TOOTH EXTRACTIONS     with bone grafting lower bottom right and left  . REDUCTION MAMMAPLASTY Bilateral   . RIGHT COLECTOMY  09-10-2003   dr Margot Chimes @[facility]    multiple colon polyps (per path tubular adenoma's,  hyperplastic , benign appendix, benign two lymph nodes  . ROTATOR CUFF REPAIR Left 04/2016  . TONSILLECTOMY  1969  . TUBAL LIGATION Bilateral 1982     Allergies:   Allergies  Allergen Reactions  . Ampicillin Hives and Other (See Comments)    Severe reaction in February 2017 1.5 month to have hives to go away  . Penicillins Hives and Other (See Comments)    Severe reaction in February 2017 1.5 month to have hives to go away Has patient had a PCN reaction causing immediate rash, facial/tongue/throat swelling, SOB or lightheadedness with hypotension: #  #  #  YES  #  #  #  Has patient had a PCN reaction causing severe rash involving mucus membranes or skin necrosis: No Has patient had a PCN reaction that required hospitalization No Has patient had a PCN reaction occurring within the last  10 years: No   . Gabapentin Swelling    UNSPECIFIED REACTION   . Other Itching    UNSPECIFIED Analgesics  . Codeine Nausea And Vomiting  . Hydrocodone-Acetaminophen Itching  . Hydromorphone Itching    Pt has taken w/o issues     Social History:  reports that she quit smoking about 13 months ago. Her smoking use included cigarettes. She has a 17.50 pack-year smoking history. She has never used smokeless tobacco. She reports current alcohol use. She reports that she does not use drugs.   Family History: Family History  Problem Relation Age of Onset  . Atrial fibrillation Mother   . Ulcers Father   . Breast cancer Maternal Aunt   . Lung cancer Maternal Uncle   . Stomach cancer Maternal Grandmother   . Lung cancer Maternal Aunt   . Brain cancer Maternal Aunt   . Prostate cancer Maternal Grandfather   . Stroke Paternal Grandmother   . Tuberculosis Paternal Grandfather   . Colon cancer Neg Hx   . Esophageal cancer Neg Hx   . Rectal cancer Neg Hx     Unacceptable: Noncontributory, unremarkable, or negative. Acceptable: Family history reviewed and not pertinent (If you reviewed  it)   Procedures/Significant Events:  Lumbar spinal stenosis s/p decompression fusion 05/30/2019 per Dr. Kathyrn Sheriff  ___________________________________________________________________________________-     I have personally reviewed and interpreted all radiology studies and my findings are as above.  VENTILATOR SETTINGS:   Cultures 3/18 RIGHT AC NGTD x2 3/18 urine positive multiple cultures 3/19 urine pending   Antimicrobials: Anti-infectives (From admission, onward)   Start     Dose/Rate Stop   07/04/19 2000  ciprofloxacin (CIPRO) tablet 250 mg     250 mg 07/07/19 1959   07/04/19 1315  sulfamethoxazole-trimethoprim (BACTRIM) 400-80 MG per tablet 1 tablet  Status:  Discontinued     1 tablet 07/04/19 1313       Devices    LINES / TUBES:      Continuous Infusions: . sodium chloride 100 mL/hr at 07/07/19 2200     Physical Exam: Vitals:   07/07/19 0516 07/07/19 1434 07/07/19 1937 07/08/19 0459  BP: 124/76 (!) 163/83 135/76 (!) 163/89  Pulse: 81 91 98 82  Resp: 18 18 18 17   Temp: 98.5 F (36.9 C) 99.2 F (37.3 C) 98.8 F (37.1 C) 98.7 F (37.1 C)  TempSrc:  Oral Oral Oral  SpO2: 96% 95% 97% 95%  Weight:      Height:        General: A/O x4, no acute respiratory distress Eyes: negative scleral hemorrhage, negative anisocoria, negative icterus ENT: Negative Runny nose, negative gingival bleeding, Neck:  Negative scars, masses, torticollis, lymphadenopathy, JVD Lungs: Clear to auscultation bilaterally without wheezes or crackles Cardiovascular: Regular rate and rhythm without murmur gallop or rub normal S1 and S2 Abdomen: Obese, negative abdominal pain, nondistended, positive soft, bowel sounds, no rebound, no ascites, no appreciable mass Extremities: No significant cyanosis, clubbing, or edema bilateral lower extremities Skin: Negative rashes, lesions, ulcers Psychiatric:  Negative depression, negative anxiety, negative fatigue, negative mania  Central  nervous system:  Cranial nerves II through XII intact, tongue/uvula midline, all extremities muscle strength 5/5, except LEFT lower extremity strength 2-3/5, sensation intact throughout, negative dysarthria, negative expressive aphasia, negative receptive aphasia.  Data reviewed:  I have personally reviewed following labs and imaging studies Labs:  CBC: Recent Labs  Lab 07/04/19 1008 07/04/19 1353 07/05/19 0428 07/06/19 0622 07/07/19 0546 07/08/19 0542  WBC 11.4*  --  9.9 8.4 7.6 10.3  NEUTROABS 8.3*  --  6.8  --   --   --   HGB 8.2*  --  8.0* 8.1* 8.3* 9.4*  HCT 25.8* 20.4* 25.6* 25.4* 26.7* 30.1*  MCV 97.4  --  99.2 98.4 100.4* 101.3*  PLT 411*  --  380 374 370 422*    Basic Metabolic Panel: Recent Labs  Lab 07/04/19 1008 07/04/19 1008 07/05/19 0428 07/05/19 0428 07/05/19 0948 07/06/19 0622 07/06/19 0622 07/07/19 0546 07/08/19 0542  NA 130*  --  131*  --   --  134*  --  140 140  K 4.9   < > 4.1   < >  --  4.3   < > 4.0 3.6  CL 98  --  99  --   --  103  --  110 107  CO2 22  --  20*  --   --  22  --  22 23  GLUCOSE 193*  --  175*  --   --  148*  --  136* 135*  BUN 36*  --  34*  --   --  16  --  7* <5*  CREATININE 1.76*  --  1.64*  --   --  0.95  --  0.96 0.80  CALCIUM 9.1  --  8.9  --   --  8.9  --  8.9 9.3  MG  --   --   --   --  2.5* 2.0  --  1.9 1.9  PHOS  --   --   --   --  4.0 3.0  --  2.5 2.5   < > = values in this interval not displayed.   GFR Estimated Creatinine Clearance: 84.2 mL/min (by C-G formula based on SCr of 0.8 mg/dL). Liver Function Tests: Recent Labs  Lab 07/04/19 1008 07/05/19 0428 07/06/19 0622 07/07/19 0546 07/08/19 0542  AST 25 26 24 24  32  ALT 31 32 32 29 36  ALKPHOS 92 86 85 90 93  BILITOT 0.6 0.5 0.4 0.3 0.4  PROT 5.7* 5.5* 5.4* 5.5* 6.2*  ALBUMIN 2.5* 2.4* 2.3* 2.2* 2.5*   No results for input(s): LIPASE, AMYLASE in the last 168 hours. No results for input(s): AMMONIA in the last 168 hours. Coagulation profile No results for  input(s): INR, PROTIME in the last 168 hours.  Cardiac Enzymes: No results for input(s): CKTOTAL, CKMB, CKMBINDEX, TROPONINI in the last 168 hours. BNP: Invalid input(s): POCBNP CBG: No results for input(s): GLUCAP in the last 168 hours. D-Dimer No results for input(s): DDIMER in the last 72 hours. Hgb A1c No results for input(s): HGBA1C in the last 72 hours. Lipid Profile No results for input(s): CHOL, HDL, LDLCALC, TRIG, CHOLHDL, LDLDIRECT in the last 72 hours. Thyroid function studies No results for input(s): TSH, T4TOTAL, T3FREE, THYROIDAB in the last 72 hours.  Invalid input(s): FREET3 Anemia work up No results for input(s): VITAMINB12, FOLATE, FERRITIN, TIBC, IRON, RETICCTPCT in the last 72 hours. Urinalysis    Component Value Date/Time   COLORURINE AMBER (A) 07/04/2019 1300   APPEARANCEUR CLOUDY (A) 07/04/2019 1300   LABSPEC 1.026 07/04/2019 1300   LABSPEC 1.025 06/24/2019 1426   PHURINE 5.0 07/04/2019 1300   GLUCOSEU NEGATIVE 07/04/2019 1300   HGBUR NEGATIVE 07/04/2019 1300   BILIRUBINUR MODERATE (A) 07/04/2019 1300   BILIRUBINUR moderate (A) 06/24/2019 1426   BILIRUBINUR n 11/25/2015 1712   KETONESUR NEGATIVE 07/04/2019 1300   PROTEINUR NEGATIVE 07/04/2019 1300  UROBILINOGEN negative 11/25/2015 1712   NITRITE NEGATIVE 07/04/2019 1300   LEUKOCYTESUR MODERATE (A) 07/04/2019 1300     Microbiology Recent Results (from the past 240 hour(s))  Culture, Urine     Status: Abnormal   Collection Time: 07/04/19  9:27 AM   Specimen: Urine, Random  Result Value Ref Range Status   Specimen Description URINE, RANDOM  Final   Special Requests   Final    NONE Performed at [facility], [place] [address], [place], [place] [address]    Culture MULTIPLE SPECIES PRESENT, SUGGEST RECOLLECTION (A)  Final   Report Status 07/05/2019 FINAL  Final  Culture, blood (routine x 2)     Status: None (Preliminary result)   Collection Time: 07/04/19  1:53 PM   Specimen: BLOOD    Result Value Ref Range Status   Specimen Description BLOOD RIGHT ANTECUBITAL  Final   Special Requests   Final    BOTTLES DRAWN AEROBIC AND ANAEROBIC Blood Culture adequate volume   Culture   Final    NO GROWTH 4 DAYS Performed at [facility], [place] [address], [place], [place] [address]    Report Status PENDING  Incomplete  Culture, blood (routine x 2)     Status: None (Preliminary result)   Collection Time: 07/04/19  1:53 PM   Specimen: BLOOD  Result Value Ref Range Status   Specimen Description BLOOD RIGHT ANTECUBITAL  Final   Special Requests   Final    BOTTLES DRAWN AEROBIC AND ANAEROBIC Blood Culture adequate volume   Culture   Final    NO GROWTH 4 DAYS Performed at [facility], [place] [address] [place], [place] [address]    Report Status PENDING  Incomplete  Culture, Urine     Status: Abnormal   Collection Time: 07/05/19  8:31 PM   Specimen: Urine, Random  Result Value Ref Range Status   Specimen Description URINE, RANDOM  Final   Special Requests   Final    NONE Performed at [facility], [place] [address], [place], [place] [address]    Culture (A)  Final    20,000 COLONIES/mL LACTOBACILLUS SPECIES Standardized susceptibility testing for this organism is not available.    Report Status 07/07/2019 FINAL  Final       Inpatient Medications:   Scheduled Meds: . allopurinol  300 mg Oral Daily  . alum & mag hydroxide-simeth  15 mL Oral TID  . anastrozole  1 mg Oral Daily  . atorvastatin  20 mg Oral q1800  . diclofenac Sodium  4 g Topical QID  . docusate sodium  100 mg Oral TID  . DULoxetine  60 mg Oral Daily  . feeding supplement (PRO-STAT SUGAR FREE 64)  30 mL Oral BID  . levothyroxine  75 mcg Oral Q0600  . magnesium gluconate  1,000 mg Oral QHS  . Melatonin  10.5 mg Oral QHS  . metaxalone  800 mg Oral TID  . methocarbamol  1,000 mg Oral TID  . morphine  15 mg Oral Q12H  . nystatin  5 mL Oral QID  . polyethylene glycol  17 g Oral BID  .  primidone  50 mg Oral QHS  . QUEtiapine  25 mg Oral QHS  . rOPINIRole  0.75 mg Oral QHS  . senna  2 tablet Oral QHS  . sodium chloride flush  10-40 mL Intracatheter Q12H  . vitamin B-12  1,000 mcg Oral Daily   Continuous Infusions: . sodium  chloride 100 mL/hr at 07/07/19 2200     Radiological Exams on Admission: No results found.  Impression/Recommendations Principal Problem:   Spinal stenosis of lumbar region Active Problems:   Mixed hyperlipidemia   Essential hypertension   COPD (chronic obstructive pulmonary disease) ([facility])   Malignant neoplasm of upper-inner quadrant of left breast in female, estrogen receptor positive ([facility])   PVC's (premature ventricular contractions)   RLS (restless legs syndrome)   Hypothyroidism   Lumbar spinal stenosis   Weakness of left lower extremity   Acute on chronic anemia   AKI (acute kidney injury) ([address])   Anxiety   Depression   Chronic diastolic CHF (congestive heart failure) ([facility])   Obesity (BMI 30-39.9)   ETOH abuse   Labile blood pressure   Tobacco abuse   Hypoalbuminemia due to protein-calorie malnutrition ([facility])  Acute on CKD stage IIIb (baseline Cr 1.18 06/27/2019) Recent Labs  Lab 07/04/19 1008 07/05/19 0428 07/06/19 0622 07/07/19 0546 07/08/19 0542  CREATININE 1.76* 1.64* 0.95 0.96 0.80  -N/V resolved, continue hydration normal saline 163ml/hr -3/20 restart Cymbalta as renal function has normalized.   -Hold nephrotoxic medication -3/18 renal ultrasound nondiagnostic see results above  UTI -UA is positive for leukocytes and bacteria.  Left flank pain resolved -Leukocytosis resolved -Continue Cipro 250 BID x5 days -Urine/blood culture pending  Essential HTN -Hold BP meds restart if BP trends up  Mild ileus -3/19 patient not complaining of abdominal pain t -3/20 KUB pending  Hypothyroidism:  -TSH: WNL checked on 03/18/2019 -Levothyroxine 75 mcg daily  Acute on chronic anemia: (Baseline HgB 9.1) Recent Labs   Lab 07/04/19 1008 07/05/19 0428 07/06/19 0622 07/07/19 0546 07/08/19 0542  HGB 8.2* 8.0* 8.1* 8.3* 9.4*  -Anemia panel pending -Occult blood pending  Hyponatremia -Asymptomatic -Monitor closely if continues to decline may need to DC psychiatric medication -3/20 resolving we will hold on discontinuing patient's psychiatric medication.  Muscle spasm -Robaxin -Metaxalone  Restless leg syndrome -Primidone -Requip  Thrombocytosis -Most likely reactive resolving continue to monitor  HLD -Lipitor 20 mg daily  HX LEFT breast cancer -S/p mastectomy, XRT -Continue Arimidex 1 mg daily  Gout -Allopurinol 300 mg daily -Uric acid level pending  Spinal stenosis of LUMBAR spine region -Status post recent L2-L3 fusion on 05/30/2019 -Neurosurgery and PT is following.  Goals of care -3/22 spoke with rehabilitation medicine this a.m. and they have assumed full care for patient, will sign off.  Thank you for this consultation.  Our [facility] hospitalist team will follow the patient with you.      Time Spent: 30-minute  Krimson Massmann, Geraldo Docker M.D. [facility] 07/08/2019, 1:57 PM  [id number]

## 2019-07-08 NOTE — Progress Notes (Signed)
Occupational Therapy Session Note  Patient Details  Name: Brandi Stephens MRN: [id number] Date of Birth: 1945/10/26  Today's Date: 07/08/2019 OT Individual Time: 2820-6015 OT Individual Time Calculation (min): 56 min    Short Term Goals: Week 1:  OT Short Term Goal 1 (Week 1): STG = LTG  Skilled Therapeutic Interventions/Progress Updates:  Patient met lying in supine in agreement with OT treatment session. Patient reports 8/10 pain in back at rest increasing to 9/10 pain with activity. RN administered medication. Patient would benefit from premedication prior to therapy treatments sessions. Patient able to roll to R with supervision A and transition to EOB with CGA. Patient indicated need for BM and able to ambulate ~67f to bathroom with use of RW. Toilet transfer with CGA and toileting/hygiene/clothing management with patient utilizing long-handled sponge for hygiene seated on commode. Patient opting to complete UB/LB bathing/dressing seated on commode requiring set-up A for UB bath/dress and Min A for LB bath/dress with use of long-handled sponge and assist to thread BLE through pants. Patient notes previous use of a reacher but difficulty manipulating for LB dressing secondary to tremors. Max A required for donning socks with patient noting that she does not wear socks at home and is able to don/doff Velcro shoes without assistance. During bathing seated on commode, patient c/o increased pain causing emesis. RN notified. Grooming seated EOB with set-up assist. Education provided for acquisition and use of AE to increase independnece with toileting tasks with use of attachable bidette. Price sheet provided. Patient left lying supine in bed with call bell within reach, bell alarm activated and RN present  Therapy Documentation Precautions:  Precautions Precautions: Back, Fall Precaution Comments: verbally reviewed back precautions.  Restrictions Weight Bearing Restrictions:  No   Therapy/Group: Individual Therapy  Jaqwan Wieber R [place] 07/08/2019, 7:43 AM

## 2019-07-08 NOTE — Discharge Summary (Addendum)
Physician Discharge Summary  Patient ID: [name] MRN: [id number] DOB/AGE: 05-19-1945 74 y.o.  Admit date: 06/28/2019 Discharge date: 07/11/2019  Discharge Diagnoses:  Principal Problem:   Spinal stenosis of lumbar region Active Problems:   Mixed hyperlipidemia   Essential hypertension   COPD (chronic obstructive pulmonary disease) (HCC)   Malignant neoplasm of upper-inner quadrant of left breast in female, estrogen receptor positive (HCC)   PVC's (premature ventricular contractions)   RLS (restless legs syndrome)   Hypothyroidism   Lumbar spinal stenosis   Weakness of left lower extremity   Acute on chronic anemia   AKI (acute kidney injury) ([place])   Anxiety   Depression   Chronic diastolic CHF (congestive heart failure) (HCC)   Obesity (BMI 30-39.9)   ETOH abuse   Labile blood pressure   Tobacco abuse   Hypoalbuminemia due to protein-calorie malnutrition [facility])   Discharged Condition: Stable  Significant Diagnostic Studies: DG Chest 2 View  Result Date: 07/04/2019 CLINICAL DATA:  Fatigue.  Back pain and spasms EXAM: CHEST - 2 VIEW COMPARISON:  03/01/2019 FINDINGS: Mild cardiomegaly. Negative aortic and hilar contours when accounting for rightward rotation. Postoperative left breast and axilla. There is no edema, consolidation, effusion, or pneumothorax. IMPRESSION: Cardiomegaly without failure. Electronically Signed   By: Monte Fantasia M.D.   On: 07/04/2019 10:37   DG Abd 1 View  Result Date: 07/01/2019 CLINICAL DATA:  Severe abdominal pain EXAM: ABDOMEN - 1 VIEW COMPARISON:  None. FINDINGS: Mild gaseous distention of the colon. No evidence of bowel obstruction. No organomegaly or free air. No suspicious calcification. No acute bony abnormality. Postoperative changes in the lumbar spine. IMPRESSION: Mild gaseous distention of the colon, nonspecific. This could reflect mild ileus. No evidence of bowel obstruction or free air. Electronically Signed   By: Rolm Baptise  M.D.   On: 07/01/2019 12:20   MR LUMBAR SPINE WO CONTRAST  Result Date: 06/25/2019 CLINICAL DATA:  Initial evaluation for acute left leg weakness. Recent surgery on 05/30/2019. EXAM: MRI LUMBAR SPINE WITHOUT CONTRAST TECHNIQUE: Multiplanar, multisequence MR imaging of the lumbar spine was performed. No intravenous contrast was administered. COMPARISON:  Previous MRI from 03/31/2019. FINDINGS: Segmentation: Standard. Lowest well-formed disc space labeled the L5-S1 level. Alignment: Trace retrolisthesis of L1 on L2. Alignment otherwise normal with preservation of the normal lumbar lordosis. Vertebrae: Postoperative changes from recent extension of posterior decompression and fusion to the L2-3 level, with sequelae of prior PLIF now seen at this level. There is prominent marrow edema within the L2 and L3 vertebral bodies. Edema extends into the adjacent psoas musculature bilaterally, left greater than right (series 10, image 16). While these findings may be postoperative in nature, appearance raises the concern for possible infection/osteomyelitis. There is question of a circumferential epidural collection at the level of L2-3 (series 10, image 15), difficult to see given adjacent susceptibility artifact. Compression of the thecal sac with persistent fairly severe spinal stenosis. Slight inferior migration of epidural collection within the ventral epidural space to the level of L3 (series 10, image 19). Elsewhere, the remainder of the fusion extending from L3 through S1 is stable in appearance. Vertebral body height maintained without acute or interval fracture. No discrete osseous lesions. No other abnormal marrow edema. Conus medullaris and cauda equina: Conus extends to the superior aspect of L1. Partially visualized conus and distal cord normal in appearance. Proximal nerve roots of the cauda equina somewhat irregular and undulating superior to the L2-3 level secondary to stenosis. Paraspinal and other soft  tissues: Postoperative changes from recent posterior decompression with fusion at L2-3. Diffuse edema seen throughout the posterior paraspinous musculature. There is a residual collection along the mid posterior soft tissues extending from approximately L3-4 through L5-S1. This measures approximately 5.0 x 4.7 x 7.4 cm (series 10, image 29). Collection is heterogeneous with a few scattered internal fluid-fluid levels, which could reflect complex postoperative seroma or hematoma. Superimposed infection not excluded. Additional smaller more simple appearing collection within the subcutaneous fat along the midline incision measures 2.0 x 1.6 x 4.3 cm (series 10, image 28). Edema within the left greater than right psoas musculature adjacent to the L2-3 interspace as above. No definite loculated collection within either psoas muscle, although evaluation limited given lack of IV contrast. Disc levels: T12-L1: Unremarkable. L1-2: Mild diffuse disc bulge with disc desiccation. Disc bulging eccentric to the left. Bilateral facet hypertrophy. Progressive mild spinal stenosis. Mild left L1 foraminal narrowing. No significant right foraminal stenosis. L2-3: Postoperative changes from interval decompressive laminectomy with facetectomy with posterior and interbody fusion. Suspected circumferential epidural collection compresses the thecal sac. Persistent severe spinal stenosis, with the thecal sac measuring approximately 7 mm in AP I diameter at its most narrow point. Foramina are grossly patent. L3-4: Prior PLIF. Mild spinal stenosis related to the posterior soft tissue collection. Foramina remain grossly patent. L4-5: Prior PLIF. Mild to moderate spinal stenosis related to the posterior soft tissue collection. Foramina remain patent. L5-S1: Prior PLIF. Thecal sac patency grossly stable. No new foraminal narrowing. IMPRESSION: 1. Postoperative changes from recent extension of posterior decompression and fusion to the L2-3  level. Prominent marrow edema within the L2 and L3 vertebral bodies with soft tissue edema within the adjacent psoas musculature as above. While these findings may in part be postoperative in nature, appearance raises the concern for possible infection/osteomyelitis. Correlation with symptomatology and laboratory values recommended. Additionally, follow-up examination with postcontrast imaging may be helpful for further evaluation as warranted. 2. Persistent severe stenosis at the level of L2-3 with question circumferential epidural collection as above. Finding difficult to be certain given adjacent susceptibility artifact. Again, postcontrast imaging may be helpful. Additionally, correlation with dedicated CT with contrast could also be performed as well. 3. Approximate 5.0 x 4.7 x 7.4 cm heterogeneous collection within the posterior paraspinous soft tissues extending from L3 through L5-S1. Differential considerations include a postoperative hematoma versus complicated seroma. Superimposed infection could be considered in the correct clinical setting. Electronically Signed   By: Jeannine Boga M.D.   On: 06/25/2019 02:56   US RENAL  Result Date: 07/04/2019 CLINICAL DATA:  Pain at LEFT flank for 2 weeks, UTI EXAM: RENAL / URINARY TRACT ULTRASOUND COMPLETE COMPARISON:  None FINDINGS: Right Kidney: Renal measurements: 11.2 x 4.7 x 7.5 cm = volume: 206 mL. Mild cortical thinning. Normal cortical echogenicity. No mass, hydronephrosis, or shadowing calcification. Left Kidney: Renal measurements: 10.5 x 6.2 x 6.1 cm = volume: 208 mL. Mild cortical thinning. Normal cortical echogenicity. Small cyst at inferior pole 2.2 x 1.9 x 1.4 cm. No additional mass, hydronephrosis or shadowing calcification. Bladder: Appears normal for degree of bladder distention. Other: Echogenic hepatic parenchyma, likely reflecting fatty infiltration, without obvious nodularity or mass to suggest cirrhosis. IMPRESSION: LEFT renal cyst  2.2 cm greatest size. No other renal sonographic abnormalities identified. Suspected fatty infiltration of liver. Electronically Signed   By: Lavonia Dana M.D.   On: 07/04/2019 16:55   DG Abd Portable 1V  Result Date: 07/06/2019 CLINICAL DATA:  Ileus EXAM: PORTABLE ABDOMEN -  1 VIEW COMPARISON:  July 01, 2019 FINDINGS: The lung bases are normal. No free air, portal venous gas, or pneumatosis. Air seen throughout the length of the colon. No convincing evidence of obstruction or ileus. No other acute abnormalities. IMPRESSION: No convincing evidence of ileus or obstruction. No acute abnormalities are identified. Electronically Signed   By: Dorise Bullion III M.D   On: 07/06/2019 09:35   CT RENAL STONE STUDY  Result Date: 06/24/2019 CLINICAL DATA:  Atypical back pain EXAM: CT ABDOMEN AND PELVIS WITHOUT CONTRAST TECHNIQUE: Multidetector CT imaging of the abdomen and pelvis was performed following the standard protocol without IV contrast. COMPARISON:  CT 02/20/2018 FINDINGS: Lower chest: Lung bases demonstrate no acute consolidation or effusion. Heart size within normal limits. Hepatobiliary: No focal liver abnormality is seen. No gallstones, gallbladder wall thickening, or biliary dilatation. Pancreas: Unremarkable. No pancreatic ductal dilatation or surrounding inflammatory changes. Spleen: Normal in size without focal abnormality. Adrenals/Urinary Tract: Adrenal glands are normal. Kidneys show no hydronephrosis. Probable cyst within the mid to lower left kidney. No ureteral stone is seen. The bladder is unremarkable. Stomach/Bowel: Stomach is nonenlarged. No dilated small bowel. Postsurgical changes of the right colon. No bowel wall thickening. Scattered diverticular disease without acute inflammatory process. Vascular/Lymphatic: Extensive aortic atherosclerosis. No aneurysm. No significant adenopathy. Reproductive: Status post hysterectomy. No adnexal masses. Other: Negative for free air or free fluid.  Musculoskeletal: Posterior fusion changes L2 through S1 with interbody devices at L2-L3, L3-L4, L4-L5 and L5-S1. Breach of the superior endplate by fixating screws on both sides at L2 with some lucency surrounding the screws. IMPRESSION: 1. Negative for hydronephrosis or ureteral stone. 2. Diverticular disease of the colon without acute inflammatory process 3. Post fusion changes of the lumbar spine L2 through S1. Fixating screws at L2 breech the superior endplate on both sides and there appears to be some lucency surrounding the fixating screws. Hardware otherwise appears intact. Electronically Signed   By: Donavan Foil M.D.   On: 06/24/2019 22:44   VAS [place] LOWER EXTREMITY VENOUS (DVT)  Result Date: 06/30/2019  Lower Venous DVTStudy Indications: Swelling, and Edema.  Comparison Study: no prior Performing Technologist: Abram Sander RVS  Examination Guidelines: A complete evaluation includes B-mode imaging, spectral Doppler, color Doppler, and power Doppler as needed of all accessible portions of each vessel. Bilateral testing is considered an integral part of a complete examination. Limited examinations for reoccurring indications may be performed as noted. The reflux portion of the exam is performed with the patient in reverse Trendelenburg.  +---------+---------------+---------+-----------+----------+--------------+ RIGHT    CompressibilityPhasicitySpontaneityPropertiesThrombus Aging +---------+---------------+---------+-----------+----------+--------------+ CFV      Full           Yes      Yes                                 +---------+---------------+---------+-----------+----------+--------------+ SFJ      Full                                                        +---------+---------------+---------+-----------+----------+--------------+ FV Prox  Full                                                         +---------+---------------+---------+-----------+----------+--------------+  FV Mid   Full                                                        +---------+---------------+---------+-----------+----------+--------------+ FV DistalFull                                                        +---------+---------------+---------+-----------+----------+--------------+ PFV      Full                                                        +---------+---------------+---------+-----------+----------+--------------+ POP      Full           Yes      Yes                                 +---------+---------------+---------+-----------+----------+--------------+ PTV      Full                                                        +---------+---------------+---------+-----------+----------+--------------+ PERO     Full                                                        +---------+---------------+---------+-----------+----------+--------------+   +---------+---------------+---------+-----------+----------+--------------+ LEFT     CompressibilityPhasicitySpontaneityPropertiesThrombus Aging +---------+---------------+---------+-----------+----------+--------------+ CFV      Full           Yes      Yes                                 +---------+---------------+---------+-----------+----------+--------------+ SFJ      Full                                                        +---------+---------------+---------+-----------+----------+--------------+ FV Prox  Full                                                        +---------+---------------+---------+-----------+----------+--------------+ FV Mid   Full                                                        +---------+---------------+---------+-----------+----------+--------------+  FV DistalFull                                                         +---------+---------------+---------+-----------+----------+--------------+ PFV      Full                                                        +---------+---------------+---------+-----------+----------+--------------+ POP      Full           Yes      Yes                                 +---------+---------------+---------+-----------+----------+--------------+ PTV      Full                                                        +---------+---------------+---------+-----------+----------+--------------+ PERO     Full                                                        +---------+---------------+---------+-----------+----------+--------------+     Summary: BILATERAL: - No evidence of deep vein thrombosis seen in the lower extremities, bilaterally.   *See table(s) above for measurements and observations. Electronically signed by Harold Barban MD on 06/30/2019 at 4:19:52 PM.    Final     Labs:  Basic Metabolic Panel: Recent Labs  Lab 07/04/19 1008 07/05/19 0428 07/05/19 9163 07/06/19 0622 07/07/19 0546 07/08/19 0542  NA 130* 131*  --  134* 140 140  K 4.9 4.1  --  4.3 4.0 3.6  CL 98 99  --  103 110 107  CO2 22 20*  --  '22 22 23  '$ GLUCOSE 193* 175*  --  148* 136* 135*  BUN 36* 34*  --  16 7* <5*  CREATININE 1.76* 1.64*  --  0.95 0.96 0.80  CALCIUM 9.1 8.9  --  8.9 8.9 9.3  MG  --   --  2.5* 2.0 1.9 1.9  PHOS  --   --  4.0 3.0 2.5 2.5    CBC: Recent Labs  Lab 07/04/19 1008 07/04/19 1353 07/05/19 0428 07/05/19 0428 07/06/19 0622 07/07/19 0546 07/08/19 0542  WBC 11.4*   < > 9.9   < > 8.4 7.6 10.3  NEUTROABS 8.3*  --  6.8  --   --   --   --   HGB 8.2*   < > 8.0*   < > 8.1* 8.3* 9.4*  HCT 25.8*   < > 25.6*   < > 25.4* 26.7* 30.1*  MCV 97.4   < > 99.2   < > 98.4 100.4* 101.3*  PLT 411*   < > 380   < > 374 370 422*   < > = values in this  interval not displayed.    CBG: No results for input(s): GLUCAP in the last 168 hours.  Family history.  Mother  atrial fibrillation Father duodenal ulcer.  Maternal aunt with breast cancer.  Maternal uncle lung cancer.  Maternal grandmother stomach cancer.  Negative for colon cancer esophageal cancer rectal cancer  Brief HPI:   BRIANA FARNER is a 74 y.o. 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 Jana Hakim, COPD recently quit smoking, tremors as well as restless leg syndrome, chronic kidney disease stage III, lumbar spinal stenosis with chronic back pain lumbar decompression 05/30/2019 per Dr. Kathyrn Sheriff discharged to home supervision level.  Patient lives with spouse two-level home.  Reports by family since recent back surgery limited ambulation and sedentary mostly staying in bed.  Presented 06/25/2019 with increasing low back pain radiating to the lower extremities.  Admission chemistries hemoglobin 9.7, WBC 12,700, BUN 70, creatinine 2.83, SARS coronavirus negative, ESR 136.  CT renal study negative for hydronephrosis or ureteral stone.  Blood cultures 1 of 4 blood cultures with GPC likely contaminant.  MRI lumbar spine showed postoperative changes from recent extension of posterior decompression and fusion L2-3 level.  Persistent stenosis at the level of L2-3 with question circumferential epidural collection.  Approximate 5 x 4 cm heterogeneous collection within the posterior paraspinous soft tissues extending from L3-5 to S1.  Follow-up neurosurgery Dr. Kathyrn Sheriff no current plan for surgical intervention Decadron protocol is initiated.  Renal function improved with gentle IV fluids.  Pain management with the use of MS Contin.  Therapy evaluations completed and patient was admitted for a comprehensive rehab program   Hospital Course: Pansie Guggisberg Skilton was admitted to rehab 06/28/2019 for inpatient therapies to consist of PT, ST and OT at least three hours five days a week. Past admission physiatrist, therapy team and rehab RN  have worked together to provide customized collaborative inpatient rehab.  Pertaining to patient's recent L2-3 fusion 05/30/2019 Decadron protocol as indicated follow-up neurosurgery surgical site healing nicely neurovascular sensation intact.  SCDs for DVT prophylaxis venous Doppler studies negative.  Pain managed with use of scheduled MS Contin as well as oxycodone for breakthrough pain, Cymbalta 60 mg daily, Skelaxin 800 mg 3 times daily as well as the use of Robaxin.  Pain management protocol would be indicated by insurance constraints as well as prior authorization.  CKD stage III remains stable latest creatinine 0.80 and recent renal ultrasound showed no hydronephrosis.  Blood pressure controlled on Toprol she would follow-up with her primary MD.  History of breast cancer invasive ductal carcinoma follow-up outpatient oncology services.  Lipitor ongoing for hyperlipidemia.  Hormone supplement for hypothyroidism.  Bouts of constipation resolved with laxative assistance.  CT of the abdomen showed no bowel obstruction however there was concerns of interval posterior slippage of the disc space at L2-3 as well as small paraspinal abscess L2-3.Marland Kitchen  Films were reviewed by both neurosurgery recommendations for interventional radiology for aspiration of joint L2-3 and infectious disease consulted for antibiotic care.  Acute on chronic anemia hemoglobin 9.4 no bleeding episodes.  She did have a history of tobacco use again receiving counseling in regards to cessation of any nicotine products.  Requip and Mysoline ongoing for history of restless leg syndrome.  Due to patient's ongoing limitations physically as well as work-up for abscess she was transferred to acute care services for ongoing care.   Blood pressures were monitored on TID basis and  controlled  She is continent of bowel and bladder.  She has made gains during rehab stay and is attending therapies  She will continue to receive follow up therapies    after discharge  Rehab course: During patient's stay in rehab weekly team conferences were held to monitor patient's progress, set goals and discuss barriers to discharge. At admission, patient required minimal assist to ambulate 200 feet rolling walker, minimal assist sit to stand, moderate assist sit to side-lying.  Minimal guard upper body bathing max assist lower body bathing minimal assist upper body dressing max assist lower body dressing minimal assist toilet transfers  Physical exam.  Blood pressure 133/67 pulse 68 temperature 98.8 respirations 18 oxygen saturation 99% room air Constitutional.  Well-developed well-nourished HEENT Head.  Normocephalic and atraumatic Eyes.  Pupils round and reactive to light no discharge without nystagmus Neck.  Supple nontender no JVD without thyromegaly Cardiac regular rate rhythm without any extra sounds or murmur heard Abdomen.  Soft nontender positive bowel sounds without rebound Respiratory effort normal no respiratory distress without wheeze Musculoskeletal.  No edema or tenderness extremities Neurological.  Alert follows commands oriented x3 Motor.  Bilateral upper extremities 4 - 4+ out of 5 proximal to distal Bilateral lower extremities 4 - 4+ out of 5 proximal to distal Skin.  Back incision clean and dry  She  has had improvement in activity tolerance, balance, postural control as well as ability to compensate for deficits. She has had improvement in functional use RUE/LUE  and RLE/LLE as well as improvement in awareness.  Working with energy conservation techniques.  Ambulates and out of bathroom rolling walker supervision.  Transferred supine minimal assist with logroll technique.  Maintains her back precautions.  She can ambulate up to 220 feet with a rolling walker.  Needed some assistance for lower body dressing independent upper body dressing.  Patient was transferred back to acute care services 07/11/2019 for ongoing care recommendations of  antibiotics and follow-up infectious disease was neurosurgery       Disposition: Discharged acute care services    Diet: Regular  Special Instructions: No driving smoking or alcohol  Medications at discharge 1.  Tylenol as needed 2.  Zyloprim 300 mg p.o. daily 3.  Xanax 0.25 mg 3 times daily as needed 4.Arinidex 1 mg p.o. daily 5.  Lipitor 20 mg p.o. daily 6.  Voltaren gel 4 g 4 times daily to affected area 7.  Colace 100 mg p.o. 3 times daily 8.  Cymbalta 60 mg p.o. daily 9.  Synthroid 75 mcg p.o. daily 10.  Magnesium gluconate 1000 mg p.o. nightly 11.  Melatonin 10.5 mg nightly 12.  Skelaxin 800 mg p.o. 3 times daily 13.  MS Contin 15 mg every 12 hours 14.  Oxycodone 5 mg every 4 hours as needed pain 15.  MiraLAX twice daily hold for loose stools 16.  Mysoline 50 mg p.o. nightly 17.  Seroquel 25 mg p.o. nightly 18.  Requip 0.75 mg nightly 19.  Vitamin B12 1000 mcg p.o. daily 20.Robaxin 100 mg TID   Discharge Instructions     Ambulatory referral to Physical Medicine Rehab   Complete by: As directed    Moderate complexity follow-up 1 to 2 weeks lumbar radiculopathy       Follow-up Information     Jamse Arn, MD Follow up.   Specialty: Physical Medicine and Rehabilitation Why: Only as directed Contact information: [address] [place] [address] [phone]  Magrinat, Virgie Dad, MD Follow up.   Specialty: Oncology Why: Call for appointment Contact information: [address] [place] [address] [phone]         [name], MD Follow up.   Specialty: Neurosurgery Why: Call for appointment Contact information: [address] [place] [address] [phone]            Signed: Cathlyn Parsons 07/09/2019, 5:52 AM Patient was seen, face-face, and physical exam performed by me on day of discharge, greater than 30 minutes of total time spent between myself and PA and  counseling and coordination of care between neurosurgery, ID, VIR, transfer, pain, spasms, medications etc.. Please see progress note from day of discharge as well.  Delice Lesch, MD, ABPMR

## 2019-07-08 NOTE — Progress Notes (Signed)
Notified by PT that patient had an assisted fall in the therapy gym. Patient was assisted to the floor and landed on knees. No injuries to report at this time. Patient is doing well. Will continue to monitor. Charge nurse notified.  Amanda Cockayne, LPN

## 2019-07-08 NOTE — Progress Notes (Signed)
[place] PHYSICAL MEDICINE & REHABILITATION PROGRESS NOTE   Subjective/Complaints: Patient seen laying in bed this morning.  She states she slept well overnight.  She notes improvement in spasms this morning.  She states she had a good weekend.  She states she is worried about her lab work.  ROS: Denies CP, SOB, N/V/D  Objective:   No results found. Recent Labs    07/07/19 0546 07/08/19 0542  WBC 7.6 10.3  HGB 8.3* 9.4*  HCT 26.7* 30.1*  PLT 370 422*   Recent Labs    07/07/19 0546 07/08/19 0542  NA 140 140  K 4.0 3.6  CL 110 107  CO2 22 23  GLUCOSE 136* 135*  BUN 7* <5*  CREATININE 0.96 0.80  CALCIUM 8.9 9.3    Intake/Output Summary (Last 24 hours) at 07/08/2019 0843 Last data filed at 07/08/2019 0809 Gross per 24 hour  Intake 130 ml  Output -  Net 130 ml     Physical Exam: Vital Signs Blood pressure (!) 163/89, pulse 82, temperature 98.7 F (37.1 C), temperature source Oral, resp. rate 17, height 5\' 8"  (1.727 m), weight 114 kg, SpO2 95 %. Constitutional: No distress . Vital signs reviewed.  Obese. HENT: Normocephalic.  Atraumatic. Eyes: EOMI. No discharge. Cardiovascular: No JVD. Respiratory: Normal effort.  No stridor. GI: Non-distended. Skin: Warm and dry.  Intact. Psych: Normal mood.  Normal behavior. Musc: No edema in extremities.  No tenderness in extremities. Neurological: Alert  Cooperative with exam. Motor: Bilateral upper extremities: 4+/5 proximal distal Bilateral lower extremities: 4/5 proximal distal  Assessment/Plan: 1. Functional deficits secondary to acute on chronic back pain S/P recent L2-L3 fusion 05/30/2019. which require 3+ hours per day of interdisciplinary therapy in a comprehensive inpatient rehab setting.  Physiatrist is providing close team supervision and 24 hour management of active medical problems listed below.  Physiatrist and rehab team continue to assess barriers to discharge/monitor patient progress toward functional  and medical goals  Care Tool:  Bathing    Body parts bathed by patient: Right arm, Left arm, Chest, Abdomen, Face, Front perineal area, Right upper leg, Left upper leg   Body parts bathed by helper: Buttocks, Left lower leg, Right lower leg Body parts n/a: Front perineal area, Buttocks, Right upper leg, Left upper leg, Right lower leg   Bathing assist Assist Level: Moderate Assistance - Patient 50 - 74%     Upper Body Dressing/Undressing Upper body dressing Upper body dressing/undressing activity did not occur (including orthotics): N/A What is the patient wearing?: Bra, Pull over shirt    Upper body assist Assist Level: Set up assist    Lower Body Dressing/Undressing Lower body dressing      What is the patient wearing?: Pants, Underwear/pull up     Lower body assist Assist for lower body dressing: Moderate Assistance - Patient 50 - 74%     Toileting Toileting    Toileting assist Assist for toileting: Minimal Assistance - Patient > 75% Assistive Device Comment: (walker)   Transfers Chair/bed transfer  Transfers assist     Chair/bed transfer assist level: Supervision/Verbal cueing Chair/bed transfer assistive device: Programmer, multimedia   Ambulation assist      Assist level: Supervision/Verbal cueing Assistive device: Walker-rolling Max distance: 10 ft   Walk 10 feet activity   Assist     Assist level: Supervision/Verbal cueing Assistive device: Walker-rolling   Walk 50 feet activity   Assist    Assist level: Contact Guard/Touching assist Assistive device:  Walker-rolling    Walk 150 feet activity   Assist    Assist level: Contact Guard/Touching assist Assistive device: Walker-rolling    Walk 10 feet on uneven surface  activity   Assist     Assist level: Minimal Assistance - Patient > 75% Assistive device: Aeronautical engineer Will patient use wheelchair at discharge?: No              Wheelchair 50 feet with 2 turns activity    Assist            Wheelchair 150 feet activity     Assist          Blood pressure (!) 163/89, pulse 82, temperature 98.7 F (37.1 C), temperature source Oral, resp. rate 17, height 5\' 8"  (1.727 m), weight 114 kg, SpO2 95 %.  Medical Problem List and Plan: 1.  Decreased functional mobility secondary to spinal stenosis S/P recent L2-L3 fusion 05/30/2019.  Decadron taper per neurosurgery  Continue CIR  2.  Antithrombotics: -DVT/anticoagulation: SCDs.               Vascular study negative for DVT             -antiplatelet therapy: N/A 3. Pain Management: Voltaren gel 4 times daily, Cymbalta 60 mg daily, Robaxin 3 times daily, MS Contin 15 mg every 12 hours, oxycodone as needed for breakthrough pain  low scheduled dose of tizanidine with hold for sedation  ?  Improving with meds on 3/22  4. Mood: Provide emotional support             -antipsychotic agents: N/A  Seroquel 25mg  HS.  5. Neuropsych: This patient is capable of making decisions on her own behalf. 6. Skin/Wound Care: Routine skin checks 7. Fluids/Electrolytes/Nutrition: Routine in and outs.   8.  Acute on chronic anemia.    Hemoglobin 9.4 on 3/22  Continue to monitor 9.  AKI on?  CKD stage III: Resolved  10.  Hypertension.  Toprol-XL 12.5 mg daily.               Monitor with increased mobility  Labile on 3/22, monitor for trend 11.  Obesity.  BMI 38.67.  Dietary follow-up 12.  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  Follow-up outpatient Dr. Jana Hakim.  Continue Armidex 13.  Hyperlipidemia.  Lipitor 14.  History of gout.  Continue Zyloprim. 15.  Hypothyroidism.  TSH 1.420.  Continue Synthroid 16.  Chronic constipation.    Adjust bowel meds as necessary 17.  Emphysema/tobacco abuse.  Check oxygen saturations every shift  Continue to counsel on smoking cessation 18.  Tremors with restless leg syndrome.  Continue Requip as well as  Mysoline 19. Hyperkalemia  Potassium 3.6 on 3/22 20. LLQ pain-   Renal US no evidence of hydronephrosis.  Improving 21. Fatigue/pallor: Appreciate NSGY follow-up.   Blood cultures pending  Urine cultures with 20,000 growth of lactobacillus  Improving 22.  Radiation induced tooth decay/Thursh:   Nystatin swish and swallow ordered. Daughter will schedule outpatient dentistry follow-up.  23.  Prediabetes  Monitor with increased mobility 24.  Hypoalbuminemia  Supplement initiated on 3/22  LOS: 10 days A FACE TO FACE EVALUATION WAS PERFORMED  Tore Carreker Lorie Phenix 07/08/2019, 8:43 AM

## 2019-07-08 NOTE — Progress Notes (Signed)
Physical Therapy Session Note  Patient Details  Name: Brandi Stephens MRN: [id number] Date of Birth: [birth year]  Today's Date: 07/08/2019 PT Individual Time: 1007-1100 1430-1530  PT Individual Time Calculation (min): 53 min and 60 min   Short Term Goals: Week 2:  PT Short Term Goal 1 (Week 2): = to LTGs based on ELOS  Skilled Therapeutic Interventions/Progress Updates:   Treatment 1: Session 1 focusing on bed mobility, transfers, and gait/stair training. Pt reports 6/10 spasming back pain in L lower back. PT provides breathing techniques, mobility, and education to manage symptoms. Supine>sit and sit to stand with CGA. Gait x120' to therapy gym and pt takes extended seated rest break. PT educates on stair sequencing for safety. Pt performs x8 steps with B handrails and CGA. Extended seated rest break for pain management. Pt attempts additional stair training but L knee buckles when attempting step and pt has assisted fall to ground. PT supports pt under her arms to help lower. Knees contact stairs but no pain or injury noted on assessment. Extended time taken for calming pt and assessing for safety and potential injuries. Additional PT assists with modA +2 sit to stand from steps and return to W/C. Pt ambulates additional 103' with CGA. Transfer back to bed with CGA and sit to supine with minA.  Left supine in bed with all needs within reach and alarm intact. RN informed of fall.  Treatment 2: Session 2 focus on community ambulation and addressing pt and family concerns for discharge. Pt reports ongoing pain in L Lower back 6/10 in severity. RN informed and provides pain medication following session.  Supine>sit with supervision and use of bedrails. Stand pivot to WC with CGA and RW. Pt taken outside and ambulates 15' with CGA and RW over uneven brick surface. No LOB but pt does complain of increase in back pain following bout of ambulation and requests to return to room. Sit to supine with  supervision.  PT speaks with pt and daughter regarding concerns for discharge and PT recommendations for mobility and safety. Strategies provided to manage back pain and encourage consistent mobilization to prevent deconditioning and improve functional strength.  Pt left supine with alarm intact and all needs within reach.   Therapy Documentation Precautions:  Precautions Precautions: Back, Fall Precaution Comments: verbally reviewed back precautions.  Restrictions Weight Bearing Restrictions: No    Therapy/Group: Individual Therapy  Breck Coons PT, DPT 07/08/2019, 4:18 PM

## 2019-07-08 NOTE — Progress Notes (Signed)
Patient was given PRN zofran for nausea. Alerted by OT that patient began to vomit after receiving morning meds. Patient denies nausea at this time. Will continue to monitor. Amanda Cockayne, LPN

## 2019-07-09 ENCOUNTER — Inpatient Hospital Stay (HOSPITAL_COMMUNITY): Payer: PPO

## 2019-07-09 ENCOUNTER — Inpatient Hospital Stay (HOSPITAL_COMMUNITY): Payer: PPO | Admitting: Occupational Therapy

## 2019-07-09 ENCOUNTER — Inpatient Hospital Stay (HOSPITAL_COMMUNITY): Payer: PPO | Admitting: Physical Therapy

## 2019-07-09 DIAGNOSIS — R109 Unspecified abdominal pain: Secondary | ICD-10-CM

## 2019-07-09 DIAGNOSIS — R29898 Other symptoms and signs involving the musculoskeletal system: Secondary | ICD-10-CM

## 2019-07-09 LAB — COMPREHENSIVE METABOLIC PANEL
ALT: 29 U/L (ref 0–44)
AST: 25 U/L (ref 15–41)
Albumin: 2.2 g/dL — ABNORMAL LOW (ref 3.5–5.0)
Alkaline Phosphatase: 78 U/L (ref 38–126)
Anion gap: 7 (ref 5–15)
BUN: 7 mg/dL — ABNORMAL LOW (ref 8–23)
CO2: 23 mmol/L (ref 22–32)
Calcium: 8.8 mg/dL — ABNORMAL LOW (ref 8.9–10.3)
Chloride: 109 mmol/L (ref 98–111)
Creatinine, Ser: 0.74 mg/dL (ref 0.44–1.00)
GFR calc Af Amer: >60 mL/min (ref >60)
GFR calc non Af Amer: >60 mL/min (ref >60)
Glucose, Bld: 146 mg/dL — ABNORMAL HIGH (ref 70–99)
Potassium: 3.7 mmol/L (ref 3.5–5.1)
Sodium: 139 mmol/L (ref 135–145)
Total Bilirubin: 0.6 mg/dL (ref 0.3–1.2)
Total Protein: 5.4 g/dL — ABNORMAL LOW (ref 6.5–8.1)

## 2019-07-09 LAB — CULTURE, BLOOD (ROUTINE X 2)
Culture: NO GROWTH
Culture: NO GROWTH
Special Requests: ADEQUATE
Special Requests: ADEQUATE

## 2019-07-09 LAB — CBC
HCT: 27.4 % — ABNORMAL LOW (ref 36.0–46.0)
Hemoglobin: 8.5 g/dL — ABNORMAL LOW (ref 12.0–15.0)
MCH: 30.6 pg (ref 26.0–34.0)
MCHC: 31 g/dL (ref 30.0–36.0)
MCV: 98.6 fL (ref 80.0–100.0)
Platelets: 317 10*3/uL (ref 150–400)
RBC: 2.78 MIL/uL — ABNORMAL LOW (ref 3.87–5.11)
RDW: 14.5 % (ref 11.5–15.5)
WBC: 8.5 10*3/uL (ref 4.0–10.5)
nRBC: 0 % (ref 0.0–0.2)

## 2019-07-09 LAB — MAGNESIUM: Magnesium: 1.8 mg/dL (ref 1.7–2.4)

## 2019-07-09 LAB — PHOSPHORUS: Phosphorus: 2.7 mg/dL (ref 2.5–4.6)

## 2019-07-09 LAB — OCCULT BLOOD X 1 CARD TO LAB, STOOL: Fecal Occult Bld: NEGATIVE

## 2019-07-09 MED ORDER — METOPROLOL SUCCINATE ER 25 MG PO TB24: 12.5000 mg | ORAL_TABLET | Freq: Every day | ORAL | 0 refills | Status: DC

## 2019-07-09 MED ORDER — IOHEXOL 9 MG/ML PO SOLN
ORAL | Status: AC
  Administered 2019-07-09: 18:00:00 500 mL
  Filled 2019-07-09: qty 500

## 2019-07-09 MED ORDER — LEVOTHYROXINE SODIUM 75 MCG PO TABS: 75.0000 ug | ORAL_TABLET | Freq: Every day | ORAL | 1 refills | Status: DC

## 2019-07-09 MED ORDER — VITAMIN B-12 1000 MCG PO TABS: 1000.0000 ug | ORAL_TABLET | Freq: Every day | ORAL | 0 refills | Status: DC

## 2019-07-09 MED ORDER — DOCUSATE SODIUM 100 MG PO CAPS: 100.0000 mg | ORAL_CAPSULE | Freq: Three times a day (TID) | ORAL | 0 refills | Status: DC

## 2019-07-09 MED ORDER — MAGNESIUM GLUCONATE 500 MG PO TABS: 1000.0000 mg | ORAL_TABLET | Freq: Every day | ORAL | 0 refills | Status: DC

## 2019-07-09 MED ORDER — ALLOPURINOL 300 MG PO TABS: ORAL_TABLET | ORAL | 0 refills | Status: DC

## 2019-07-09 MED ORDER — QUETIAPINE FUMARATE 25 MG PO TABS: 25.0000 mg | ORAL_TABLET | Freq: Every day | ORAL | 0 refills | Status: DC

## 2019-07-09 MED ORDER — OXYCODONE HCL 5 MG PO TABS: 5.0000 mg | ORAL_TABLET | ORAL | 0 refills | Status: DC | PRN

## 2019-07-09 MED ORDER — IOHEXOL 300 MG/ML  SOLN
100.0000 mL | Freq: Once | INTRAMUSCULAR | Status: AC | PRN
  Administered 2019-07-09: 100 mL via INTRAVENOUS

## 2019-07-09 MED ORDER — DICLOFENAC SODIUM 1 % EX GEL: 4.0000 g | Freq: Four times a day (QID) | CUTANEOUS | 0 refills | Status: DC

## 2019-07-09 MED ORDER — METHOCARBAMOL 500 MG PO TABS: 1000.0000 mg | ORAL_TABLET | Freq: Three times a day (TID) | ORAL | 0 refills | Status: DC

## 2019-07-09 MED ORDER — PRIMIDONE 50 MG PO TABS: ORAL_TABLET | ORAL | 1 refills | Status: DC

## 2019-07-09 MED ORDER — ALBUTEROL SULFATE HFA 108 (90 BASE) MCG/ACT IN AERS: INHALATION_SPRAY | RESPIRATORY_TRACT | 0 refills | Status: DC

## 2019-07-09 MED ORDER — MELATONIN 300 MCG PO TABS: 10.5000 mg | ORAL_TABLET | Freq: Every day | ORAL | 0 refills | Status: DC

## 2019-07-09 MED ORDER — DULOXETINE HCL 60 MG PO CPEP: ORAL_CAPSULE | ORAL | 2 refills | Status: DC

## 2019-07-09 MED ORDER — ATORVASTATIN CALCIUM 20 MG PO TABS: 20.0000 mg | ORAL_TABLET | Freq: Every day | ORAL | 1 refills | Status: DC

## 2019-07-09 MED ORDER — METAXALONE 800 MG PO TABS: 800.0000 mg | ORAL_TABLET | Freq: Three times a day (TID) | ORAL | 0 refills | Status: DC

## 2019-07-09 MED ORDER — ALPRAZOLAM 0.25 MG PO TABS: 0.2500 mg | ORAL_TABLET | Freq: Three times a day (TID) | ORAL | 0 refills | Status: DC | PRN

## 2019-07-09 MED ORDER — ROPINIROLE HCL 0.25 MG PO TABS: 0.7500 mg | ORAL_TABLET | Freq: Every day | ORAL | 0 refills | Status: DC

## 2019-07-09 MED ORDER — METOPROLOL SUCCINATE ER 25 MG PO TB24
12.5000 mg | ORAL_TABLET | Freq: Every day | ORAL | Status: DC
  Administered 2019-07-09 – 2019-07-11 (×3): 12.5 mg via ORAL
  Filled 2019-07-09 (×3): qty 1

## 2019-07-09 MED ORDER — MORPHINE SULFATE ER 15 MG PO TBCR: 15.0000 mg | EXTENDED_RELEASE_TABLET | Freq: Two times a day (BID) | ORAL | 0 refills | Status: DC

## 2019-07-09 NOTE — Progress Notes (Signed)
Physical Therapy Discharge Summary  Patient Details  Name: Brandi Stephens MRN: [id number] Date of Birth: [birth year]  Today's Date: 07/09/2019 PT Individual Time: 1300-1415 PT Individual Time Calculation (min): 75 min    Patient has met 6 of 9 long term goals due to improved activity tolerance, improved balance and increased strength.  Patient to discharge at an ambulatory level Supervision.   [name] is independent to provide the necessary physical assistance at discharge.  Reasons goals not met: Pt did no meet ambulation distance goal or stair goal secondary to ongoing back pain and requiring seated rest breaks. Car transfer requires minA for LLE management.  Recommendation:  Patient will benefit from ongoing skilled PT services in home health setting to continue to advance safe functional mobility, address ongoing impairments in strength, balance, gait training with LRAD, and minimize fall risk.  Equipment: No equipment provided. Pt already has all necessary DME.  Reasons for discharge: treatment goals met and discharge from hospital  Patient/family agrees with progress made and goals achieved: Yes   Skilled Therapeutic Interventions/Progress Update:  Session focusing on ambulation and addressing concerns for discharge. Pt reports spasming back pain in left lower back and sharp pain in anterior L hip/L lower abdomen. Pt reports heat helps with pain and PT provides mobility, education, and there-ex for pain management.  Supervision for all mobility with cues for improved technique and logrolling to facilitate bed mobility. Extended rest breaks throughout session to work on breathing and for pain management. Pt ambulates total of 750' throughout session with multiple seated rest breaks. Longest bout is 235'. Pt reports shortness of breath and increased back pain during ambulation. Pt attempts Nu-step but is unable to perform secondary to exacerbation of back pain.   PT  provides pt with HEP handout for core strengthening exercises in supine, including transverse abdominus activation, isometric hip ab/adducation, and supine clamshells. Pt performs each exercise x3 reps with PT verbal and tactile cues for improved performance.  Pt left supine in bed with alarm intact and all needs within reach.  PT Discharge Precautions/Restrictions Precautions Precautions: Back;Fall Precaution Comments: verbally reviewed back precautions.  Restrictions Weight Bearing Restrictions: No Vision/Perception  Perception Perception: Within Functional Limits Praxis Praxis: Intact  Cognition Overall Cognitive Status: Within Functional Limits for tasks assessed Arousal/Alertness: Awake/alert Orientation Level: Oriented X4 Sensation Sensation Light Touch: Appears Intact Light Touch Impaired Details: Impaired LLE Hot/Cold: Appears Intact Proprioception: Appears Intact Stereognosis: Appears Intact Coordination Gross Motor Movements are Fluid and Coordinated: No Fine Motor Movements are Fluid and Coordinated: Yes Coordination and Movement Description: gross motor impaired due to pain with guarded movement and L LE weaknesss Motor  Motor Motor: Within Functional Limits Motor - Skilled Clinical Observations: generalized deconditioning with L LE weakness Motor - Discharge Observations: limited by frequent and strong back muscle spasms  Mobility Bed Mobility Bed Mobility: Rolling Right;Rolling Left;Supine to Sit;Sit to Supine Rolling Right: Supervision/verbal cueing Rolling Left: Supervision/Verbal cueing Right Sidelying to Sit: Supervision/Verbal cueing Sit to Supine: Supervision/Verbal cueing Sit to Sidelying Right: Supervision/Verbal cueing Transfers Transfers: Sit to Stand;Stand to Sit;Stand Pivot Transfers Sit to Stand: Supervision/Verbal cueing Stand to Sit: Supervision/Verbal cueing Stand Pivot Transfers: Supervision/Verbal cueing Stand Pivot Transfer Details:  Verbal cues for precautions/safety;Verbal cues for safe use of DME/AE;Verbal cues for technique Transfer (Assistive device): Rolling walker Locomotion  Gait Ambulation: Yes Gait Assistance: Supervision/Verbal cueing Gait Distance (Feet): 245 Feet Assistive device: Rolling walker Gait Assistance Details: Verbal cues for sequencing;Verbal cues for technique;Verbal cues for safe  use of DME/AE Stairs / Additional Locomotion Stairs: Yes Stairs Assistance: Contact Guard/Touching assist Stair Management Technique: Two rails Number of Stairs: 8 Height of Stairs: 6 Ramp: Supervision/Verbal cueing  Trunk/Postural Assessment  Cervical Assessment Cervical Assessment: Within Functional Limits Thoracic Assessment Thoracic Assessment: Within Functional Limits Lumbar Assessment Lumbar Assessment: Exceptions to WFL(guarded/restricted movement due to surgeries) Postural Control Postural Control: Within Functional Limits Postural Limitations: relies on RW for UE support during standing, but can release 1 hand at a time for clothing management  Balance Balance Balance Assessed: Yes Static Sitting Balance Static Sitting - Balance Support: Feet supported Static Sitting - Level of Assistance: 7: Independent Dynamic Sitting Balance Dynamic Sitting - Level of Assistance: 6: Modified independent (Device/Increase time) Static Standing Balance Static Standing - Level of Assistance: 5: Stand by assistance Dynamic Standing Balance Dynamic Standing - Level of Assistance: 5: Stand by assistance Extremity Assessment  RUE Assessment RUE Assessment: Within Functional Limits LUE Assessment LUE Assessment: Within Functional Limits RLE Assessment RLE Assessment: Exceptions to [facility] General Strength Comments: RLE grossly 4/5(R hip limited by pain) LLE Assessment LLE Assessment: Exceptions to [facility] General Strength Comments: LLE grossly 3+/5    Breck Coons, PT, DPT 07/09/2019, 3:54 PM

## 2019-07-09 NOTE — Progress Notes (Signed)
Physical Therapy Session Note  Patient Details  Name: Brandi Stephens MRN: [id number] Date of Birth: 1945-07-19  Today's Date: 07/09/2019 PT Individual Time: 0807-0906 PT Individual Time Calculation (min): 59 min   Short Term Goals: Week 2:  PT Short Term Goal 1 (Week 2): = to LTGs based on ELOS  Skilled Therapeutic Interventions/Progress Updates:   Pt received supine in bed inquiring about D/C plan tomorrow and her pain levels - pt educated on plan for D/C tomorrow and MD follow-up regarding pain levels with pt stating she feels she will heel better at home but then later pt reporting she is disappointed about still having muscle spasms limiting her mobility knowing she is going home tomorrow. Supine>sit via logroll technique with supervision using bedrails and HOB elevated (pt will have these features at home) with pt demonstrating improving ability to perform this with less pain this AM and pt stating that outloud as well. Sitting EOB is still uncomfortable for pt so she only tolerates this position for ~67minute then stands to RW with close supervision for safety. Gait ~68ft x2 in/out of bathroom using RW with close supervision for safety - continues to rely on B UE support during gait but improving as well as continues to have upper trap tension/over-activation during ambulation due to muscle guarding. Sit<>stand on/off toilet using grab bars with supervision, performs LB clothing management without assist but requires assist for peri-care. Once in room pt request to sit in w/c as opposed to ambulate to therapy gym due to pain levels.  Transported to/from gym in w/c for time management and energy conservation. Gait training ~36ft using RW with close supervision - demonstrates slow gait speed with continued UE guarding with tension noted in upper traps. Simulated ambulatory car transfer (sedan height) using RW with min assist for placing L LE in/out of vehicle and education on pivoting in seat to  avoid twisting at the spine as pt concerned about this. Ambulated ~64ft up/down ramp using RW with close supervision for safety. Upon returning to sit in w/c pt starts having strong muscle spasms and requests to return to room - therapist continues to provide breathing and relaxation techniques for pain management but they are minimally effective at mitigating pain when muscle spasms become so intense. Transported back to room in w/c. Stand pivot to EOB using RW with supervision. Sit>supine with supervision and pt demonstrating decreased pain with this mobility task compared to in prior sessions with this therapist. Pt's daughter, [name], arrived and reports she can see a decrease in the pt's pain during mobility tasks. Therapist educated her on follow-up therapy recommendations. Pt left supine in bed with needs in reach, bed alarm on, and her daughter present.  Therapy Documentation Precautions:  Precautions Precautions: Back, Fall Precaution Comments: verbally reviewed back precautions.  Restrictions Weight Bearing Restrictions: No  Pain: Pt reports increased low back pain with activity rated as 10/10 at its worse - RN reports medication administered this AM but notified during session of pt's increased pain.    Therapy/Group: Individual Therapy  Tawana Scale, PT, DPT 07/09/2019, 8:25 AM

## 2019-07-09 NOTE — Plan of Care (Signed)
  Problem: Consults Goal: RH GENERAL PATIENT EDUCATION Description: See Patient Education module for education specifics. Outcome: Progressing   Problem: RH BOWEL ELIMINATION Goal: RH STG MANAGE BOWEL WITH ASSISTANCE Description: STG Manage Bowel with MOD IAssistance. Outcome: Progressing Goal: RH STG MANAGE BOWEL W/MEDICATION W/ASSISTANCE Description: STG Manage Bowel with Medication with MIN Assistance. Outcome: Progressing

## 2019-07-09 NOTE — Progress Notes (Signed)
Patient observed to have, what she calls "severe muscle spasms".  She is very animated in her description of her pain and rates the pain as a "10" when she has visitors (daughter now present in room).  I observed that she exhibits no obvious spasms as I'm passing by her room and observe her.without her knowledge.

## 2019-07-09 NOTE — Progress Notes (Signed)
Occupational Therapy Discharge Summary  Patient Details  Name: Brandi Stephens MRN: [id number] Date of Birth: May 28, 1945     Patient has met 9 of 9 long term goals due to improved activity tolerance, improved balance, postural control and ability to compensate for deficits.  Patient to discharge at overall Supervision level with mobility and a min A level with toileting and LB self care.  [name] is independent to provide the necessary physical assistance at discharge.    Reasons goals not met: n/a  Recommendation:  Patient will benefit from ongoing skilled OT services in home health setting to continue to advance functional skills in the area of BADL and iADL.  Equipment: No equipment provided  Reasons for discharge: treatment goals met  Patient/family agrees with progress made and goals achieved: Yes  OT Discharge Precautions/Restrictions  Precautions Precautions: Back;Fall Precaution Comments: verbally reviewed back precautions.  Restrictions Weight Bearing Restrictions: No ADL ADL Eating: Independent Grooming: Independent Where Assessed-Grooming: Sitting at sink Upper Body Bathing: Independent Where Assessed-Upper Body Bathing: Shower Lower Body Bathing: Supervision/safety Where Assessed-Lower Body Bathing: Shower Upper Body Dressing: Setup Where Assessed-Upper Body Dressing: Edge of bed Lower Body Dressing: Minimal assistance Where Assessed-Lower Body Dressing: Edge of bed Toileting: Minimal assistance Where Assessed-Toileting: Glass blower/designer: Close supervision Toilet Transfer Method: Counselling psychologist: Energy manager: Close supervision Social research officer, government Method: Heritage manager: Radio broadcast assistant ADL Comments: declined Physiological scientist Patient Visual Report: No change from baseline Vision Assessment?: No apparent visual deficits Perception  Perception: Within Functional  Limits Praxis Praxis: Intact Cognition Overall Cognitive Status: Within Functional Limits for tasks assessed Sensation Sensation Light Touch Impaired Details: Impaired LLE Hot/Cold: Appears Intact Proprioception: Appears Intact Stereognosis: Appears Intact Coordination Gross Motor Movements are Fluid and Coordinated: No Fine Motor Movements are Fluid and Coordinated: Yes Coordination and Movement Description: gross motor impaired due to pain with guarded movement and L LE weaknesss Motor  Motor Motor: Within Functional Limits Motor - Discharge Observations: limited by frequent and strong back muscle spasms Mobility  Transfers Stand to Sit: Supervision/Verbal cueing  Trunk/Postural Assessment  Postural Control Postural Limitations: relies on RW for UE support during standing, but can release 1 hand at a time for clothing management  Balance Static Sitting Balance Static Sitting - Level of Assistance: 7: Independent Dynamic Sitting Balance Dynamic Sitting - Level of Assistance: 6: Modified independent (Device/Increase time) Static Standing Balance Static Standing - Level of Assistance: 5: Stand by assistance Dynamic Standing Balance Dynamic Standing - Level of Assistance: 5: Stand by assistance Extremity/Trunk Assessment RUE Assessment RUE Assessment: Within Functional Limits LUE Assessment LUE Assessment: Within Functional Limits   Mare Ludtke 07/09/2019, 12:48 PM

## 2019-07-09 NOTE — Progress Notes (Signed)
Occupational Therapy Session Note  Patient Details  Name: Brandi Stephens MRN: [id number] Date of Birth: [birth year]  Today's Date: 07/09/2019 OT Individual Time: 0945-1100 OT Individual Time Calculation (min): 75 min    Short Term Goals: Week 1:  OT Short Term Goal 1 (Week 1): STG = LTG Week 2:  OT Short Term Goal 1 (Week 2): Pt's LOS extended a few days as she has had some delays due to medical complications.  Continue with revised LTGs.  Skilled Therapeutic Interventions/Progress Updates:    Pt received in bed with daughter in the room. Pt stating she was in quite a bit of pain and was feeling very frustrated with her lack of progress with her pain levels.  Encouraged pt to get out of bed to shower.  Pt agreed and prepped by by covering Picc line with plastic, but as soon as she started to move she was in extreme pain and felt that she couldn't move.   Discussed with daughter and pt about her progress and how strong she is and that her muscle function is good and to keep moving as much as possible to avoid atrophy. Pt did sit to EOB and then ambulated to bathroom to toilet with close S.  She needed some A with cleansing and to fully pull pants over hips.    Pt declined shower but walked to recliner.  She was only able to tolerate sitting a few minutes then wanted to return to bed.  Discussed use of a bidet which daughter is planning to purchase and toilet aids.  Talked about the recovery process. Pt and daughter with more questions so had her PA come talk to her which reassured her quite a bit.    Pt resting in bed with all needs met.   Therapy Documentation Precautions:  Precautions Precautions: Back, Fall Precaution Comments: verbally reviewed back precautions.  Restrictions Weight Bearing Restrictions: No     Pain: Pain Assessment Pain Scale: 0-10 Pain Score: 8  Pain Type: Chronic pain Pain Location: Back Pain Descriptors / Indicators: Discomfort Pain Intervention(s):  Medication (See eMAR)    Therapy/Group: Individual Therapy  Prichard 07/09/2019, 8:54 AM

## 2019-07-09 NOTE — Progress Notes (Addendum)
[facility] PHYSICAL MEDICINE & REHABILITATION PROGRESS NOTE   Subjective/Complaints: Patient seen laying in bed this morning.  She states she slept well overnight.  She notes left lower quadrant pain.  She was seen by hospitalist yesterday, notes reviewed-signed off.  She has questions regarding discharge date.  ROS: + Left lower quadrant pain.  Denies CP, SOB, N/V/D  Objective:   No results found. Recent Labs    07/08/19 0542 07/09/19 0548  WBC 10.3 8.5  HGB 9.4* 8.5*  HCT 30.1* 27.4*  PLT 422* 317   Recent Labs    07/08/19 0542 07/09/19 0548  NA 140 139  K 3.6 3.7  CL 107 109  CO2 23 23  GLUCOSE 135* 146*  BUN <5* 7*  CREATININE 0.80 0.74  CALCIUM 9.3 8.8*    Intake/Output Summary (Last 24 hours) at 07/09/2019 0815 Last data filed at 07/09/2019 0400 Gross per 24 hour  Intake 940 ml  Output 3 ml  Net 937 ml     Physical Exam: Vital Signs Blood pressure (!) 171/92, pulse 96, temperature 98.6 F (37 C), temperature source Oral, resp. rate 18, height 5\' 8"  (1.727 m), weight 114 kg, SpO2 93 %. Constitutional: No distress . Vital signs reviewed.  + Obese. HENT: Normocephalic.  Atraumatic. Eyes: EOMI. No discharge. Cardiovascular: No JVD. Respiratory: Normal effort.  No stridor. GI: Non-distended.  + Left lower quadrant TTP. Skin: Warm and dry.  Intact. Psych: Somewhat anxious. Musc: No edema in extremities.  No tenderness in extremities. Neurological: Alert Cooperative with exam. Motor: Bilateral upper extremities: 4+/5 proximal distal Bilateral lower extremities: 4/5 proximal distal, unchanged  Assessment/Plan: 1. Functional deficits secondary to acute on chronic back pain S/P recent L2-L3 fusion 05/30/2019. which require 3+ hours per day of interdisciplinary therapy in a comprehensive inpatient rehab setting.  Physiatrist is providing close team supervision and 24 hour management of active medical problems listed below.  Physiatrist and rehab team continue  to assess barriers to discharge/monitor patient progress toward functional and medical goals  Care Tool:  Bathing    Body parts bathed by patient: Right arm, Left arm, Chest, Abdomen, Face, Front perineal area, Right upper leg, Left upper leg, Buttocks   Body parts bathed by helper: Right lower leg, Left lower leg Body parts n/a: Front perineal area, Buttocks, Right upper leg, Left upper leg, Right lower leg   Bathing assist Assist Level: Minimal Assistance - Patient > 75%     Upper Body Dressing/Undressing Upper body dressing Upper body dressing/undressing activity did not occur (including orthotics): N/A What is the patient wearing?: Bra, Pull over shirt    Upper body assist Assist Level: Set up assist    Lower Body Dressing/Undressing Lower body dressing      What is the patient wearing?: Pants, Underwear/pull up     Lower body assist Assist for lower body dressing: Minimal Assistance - Patient > 75%     Toileting Toileting    Toileting assist Assist for toileting: Contact Guard/Touching assist Assistive Device Comment: RW   Transfers Chair/bed transfer  Transfers assist     Chair/bed transfer assist level: Supervision/Verbal cueing Chair/bed transfer assistive device: Programmer, multimedia   Ambulation assist      Assist level: Supervision/Verbal cueing Assistive device: Walker-rolling Max distance: 120'   Walk 10 feet activity   Assist     Assist level: Supervision/Verbal cueing Assistive device: Walker-rolling   Walk 50 feet activity   Assist    Assist level: Supervision/Verbal cueing  Assistive device: Walker-rolling    Walk 150 feet activity   Assist    Assist level: Contact Guard/Touching assist Assistive device: Walker-rolling    Walk 10 feet on uneven surface  activity   Assist     Assist level: Minimal Assistance - Patient > 75% Assistive device: Aeronautical engineer Will patient  use wheelchair at discharge?: No             Wheelchair 50 feet with 2 turns activity    Assist            Wheelchair 150 feet activity     Assist          Blood pressure (!) 171/92, pulse 96, temperature 98.6 F (37 C), temperature source Oral, resp. rate 18, height 5\' 8"  (1.727 m), weight 114 kg, SpO2 93 %.  Medical Problem List and Plan: 1.  Decreased functional mobility secondary to spinal stenosis S/P recent L2-L3 fusion 05/30/2019.  Decadron taper per neurosurgery  Continue CIR  2.  Antithrombotics: -DVT/anticoagulation: SCDs.               Vascular study negative for DVT             -antiplatelet therapy: N/A 3. Pain Management: Voltaren gel 4 times daily, Cymbalta 60 mg daily, Robaxin 3 times daily, MS Contin 15 mg every 12 hours, oxycodone as needed for breakthrough pain  low scheduled dose of tizanidine  Stable with meds on 3/23 4. Mood: Provide emotional support             -antipsychotic agents: N/A  Seroquel 25mg  HS.  5. Neuropsych: This patient is capable of making decisions on her own behalf. 6. Skin/Wound Care: Routine skin checks 7. Fluids/Electrolytes/Nutrition: Routine in and outs.   8.  Acute on chronic anemia.    Hemoglobin 8.5 on 3/23  Continue to monitor 9.  AKI on?  CKD stage III: Resolved  10.  Hypertension.  Toprol-XL 12.5 mg daily restarted on 3/23.               Monitor with increased mobility  Elevated on 3/23 11.  Obesity.  BMI 38.67.  Dietary follow-up 12.  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  Follow-up outpatient Dr. Jana Hakim.  Continue Armidex 13.  Hyperlipidemia.  Lipitor 14.  History of gout.  Continue Zyloprim. 15.  Hypothyroidism.  TSH 1.420.  Continue Synthroid 16.  Chronic constipation.    Adjust bowel meds as necessary 17.  Emphysema/tobacco abuse.  Check oxygen saturations every shift  Continue to counsel on smoking cessation 18.  Tremors with restless leg syndrome.  Continue Requip  as well as Mysoline 19. Hyperkalemia  Potassium 3.7 on 3/23 20. LLQ pain-   Renal US no evidence of hydronephrosis.  CT abdomen pelvis ordered on 3/23 21. Fatigue/pallor: Appreciate NSGY follow-up.   Blood cultures no growth  Urine cultures with 20,000 growth of lactobacillus 22.  Radiation induced tooth decay/Thursh:   Nystatin swish and swallow ordered. Daughter will schedule outpatient dentistry follow-up.  23.  Prediabetes  Monitor with increased mobility  Mildly elevated on 3/23 24.  Hypoalbuminemia  Supplement initiated on 3/22  LOS: 11 days A FACE TO FACE EVALUATION WAS PERFORMED  Jaedynn Bohlken Lorie Phenix 07/09/2019, 8:15 AM

## 2019-07-10 ENCOUNTER — Encounter (HOSPITAL_COMMUNITY): Payer: Self-pay | Admitting: Physical Medicine & Rehabilitation

## 2019-07-10 DIAGNOSIS — Z888 Allergy status to other drugs, medicaments and biological substances status: Secondary | ICD-10-CM

## 2019-07-10 DIAGNOSIS — Z880 Allergy status to penicillin: Secondary | ICD-10-CM

## 2019-07-10 DIAGNOSIS — Z885 Allergy status to narcotic agent status: Secondary | ICD-10-CM

## 2019-07-10 DIAGNOSIS — D62 Acute posthemorrhagic anemia: Secondary | ICD-10-CM

## 2019-07-10 DIAGNOSIS — Z981 Arthrodesis status: Secondary | ICD-10-CM

## 2019-07-10 DIAGNOSIS — T8149XA Infection following a procedure, other surgical site, initial encounter: Secondary | ICD-10-CM

## 2019-07-10 DIAGNOSIS — Z87891 Personal history of nicotine dependence: Secondary | ICD-10-CM

## 2019-07-10 DIAGNOSIS — I5032 Chronic diastolic (congestive) heart failure: Secondary | ICD-10-CM

## 2019-07-10 DIAGNOSIS — Z881 Allergy status to other antibiotic agents status: Secondary | ICD-10-CM

## 2019-07-10 DIAGNOSIS — M4626 Osteomyelitis of vertebra, lumbar region: Secondary | ICD-10-CM

## 2019-07-10 DIAGNOSIS — G8918 Other acute postprocedural pain: Secondary | ICD-10-CM

## 2019-07-10 LAB — CBC
HCT: 28.8 % — ABNORMAL LOW (ref 36.0–46.0)
Hemoglobin: 8.9 g/dL — ABNORMAL LOW (ref 12.0–15.0)
MCH: 30.4 pg (ref 26.0–34.0)
MCHC: 30.9 g/dL (ref 30.0–36.0)
MCV: 98.3 fL (ref 80.0–100.0)
Platelets: 325 10*3/uL (ref 150–400)
RBC: 2.93 MIL/uL — ABNORMAL LOW (ref 3.87–5.11)
RDW: 14.5 % (ref 11.5–15.5)
WBC: 9.3 10*3/uL (ref 4.0–10.5)
nRBC: 0 % (ref 0.0–0.2)

## 2019-07-10 LAB — COMPREHENSIVE METABOLIC PANEL
ALT: 29 U/L (ref 0–44)
AST: 23 U/L (ref 15–41)
Albumin: 2.4 g/dL — ABNORMAL LOW (ref 3.5–5.0)
Alkaline Phosphatase: 89 U/L (ref 38–126)
Anion gap: 9 (ref 5–15)
BUN: <5 mg/dL — ABNORMAL LOW (ref 8–23)
CO2: 26 mmol/L (ref 22–32)
Calcium: 9 mg/dL (ref 8.9–10.3)
Chloride: 101 mmol/L (ref 98–111)
Creatinine, Ser: 0.73 mg/dL (ref 0.44–1.00)
GFR calc Af Amer: >60 mL/min (ref >60)
GFR calc non Af Amer: >60 mL/min (ref >60)
Glucose, Bld: 146 mg/dL — ABNORMAL HIGH (ref 70–99)
Potassium: 3.3 mmol/L — ABNORMAL LOW (ref 3.5–5.1)
Sodium: 136 mmol/L (ref 135–145)
Total Bilirubin: 0.4 mg/dL (ref 0.3–1.2)
Total Protein: 5.9 g/dL — ABNORMAL LOW (ref 6.5–8.1)

## 2019-07-10 LAB — PHOSPHORUS: Phosphorus: 3.3 mg/dL (ref 2.5–4.6)

## 2019-07-10 MED ORDER — POTASSIUM CHLORIDE CRYS ER 20 MEQ PO TBCR
20.0000 meq | EXTENDED_RELEASE_TABLET | Freq: Every day | ORAL | Status: DC
  Administered 2019-07-11: 09:00:00 20 meq via ORAL
  Filled 2019-07-10 (×2): qty 1

## 2019-07-10 NOTE — H&P (Signed)
Chief Complaint: Post procedure abscess  Referring Physician(s): [name]  Supervising Physician: Arne Cleveland  Patient Status: [facility] - In-pt  History of Present Illness: Brandi Stephens is a 74 y.o. female with lumbar spinal stenosis who is status post recent decompression and fusion 05/30/2019 per Dr. Kathyrn Sheriff.  She has been participating in [place] and continued to c/o pain.   She says she felt she was supposed to get better, not worse.  CT scan was obtained for evaluation which showed = Extensive postsurgical changes of the lumbar spine with posterior fusion at L2-S1. Interval posterior slippage of the disc spacer at L2-L3 with findings of loosening of the left L2 screw. There are findings of infection and small paraspinal abscesses at L2-L3.  We are asked to perform an image guided aspiration so antibiotics can be streamlined.  Past Medical History:  Diagnosis Date  . AIN (anal intraepithelial neoplasia) anal canal   . Anemia    with pregnancy  . Anxiety   . Arthritis    knees, lower back, knees  . Chronic constipation   . Chronic diastolic CHF (congestive heart failure) ([place]) 07/05/2019  . CKD (chronic kidney disease)   . Coarse tremors    Essential  . COPD (chronic obstructive pulmonary disease) ([place])    2017 chest CT, history of  . Degenerative lumbar spinal stenosis   . Depression   . Depression 07/05/2019  . Drug dependency ([place])   . Elevated PTHrP level 05/09/2019  . ETOH abuse 07/05/2019  . GERD (gastroesophageal reflux disease)    pt denies  . Gout   . Grade I diastolic dysfunction 0000000   Noted ECHO  . Hemorrhoids   . History of adenomatous polyp of colon   . History of basal cell carcinoma (BCC) excision 2015   nose  . History of sepsis 05/14/2014   post lumbar surgery  . History of shingles    x2  . Hyperlipidemia   . Hypertension   . Hypothyroidism   . Irregular heart beat    history of while undergoing radiation   . Malignant neoplasm of upper-inner quadrant of left breast in female, estrogen receptor positive [facility]) oncologist-  dr Jana Hakim--  per lov in epic no recurrence   dx 07-13-2015  Left breast invasive ductal carcinoma, Stage IA, Grade 1 (TXN0),  09-16-2015  s/p  left breast lumpectomy with sln bx's,  completed radiation 11-18-2015,  started antiestrogen therapy 12-14-2015  . Neuropathy    Left foot and back of left leg  . Obesity (BMI 30-39.9) 07/05/2019  . Personal history of radiation therapy    completed 11-18-2015  left breast  . Pneumonia   . PONV (postoperative nausea and vomiting)   . Prediabetes    diet controlled, no med  . Restless leg syndrome   . Seasonal allergies   . Seizure ([place]) 05/2015   due to sepsis, only one time episode  . Tremor   . Wears partial dentures    lower     Past Surgical History:  Procedure Laterality Date  . ABDOMINAL HYSTERECTOMY  1985  . BASAL CELL CARCINOMA EXCISION  10/15   nose  . BREAST BIOPSY Right 08/24/2015  . BREAST BIOPSY Right 07/13/2015  . BREAST BIOPSY Left 07/13/2015  . BREAST EXCISIONAL BIOPSY Left 1972  . BREAST LUMPECTOMY Left   . BREAST LUMPECTOMY WITH RADIOACTIVE SEED AND SENTINEL LYMPH NODE BIOPSY Left 09/16/2015   Procedure: BREAST LUMPECTOMY WITH RADIOACTIVE SEED AND SENTINEL LYMPH  NODE BIOPSY;  Surgeon: Stark Klein, MD;  Location: [facility];  Service: General;  Laterality: Left;  . BREAST REDUCTION SURGERY Bilateral 1998  . CATARACT EXTRACTION W/ INTRAOCULAR LENS IMPLANT Left 2016  . [place]   hallo  . COLONOSCOPY  01/2013   polyps  . EYE SURGERY Left 2016  . HAND SURGERY Left 02-19-2002   dr Fredna Dow  @[facility]    repair collateral ligament/ MPJ left thumb  . [place]  2008  . HIGH RESOLUTION ANOSCOPY N/A 02/28/2018   Procedure: HIGH RESOLUTION ANOSCOPY WITH BIOPSY;  Surgeon: Leighton Ruff, MD;  Location: [facility];  Service: General;  Laterality: N/A;  .  KNEE ARTHROSCOPY Right 2002  . LUMBAR SPINE SURGERY  03-20-2015   dr Kathyrn Sheriff   fusion L4-5, L5-S1  . MOUTH SURGERY  07/14/2018   2 infected teeth removed  . MULTIPLE TOOTH EXTRACTIONS     with bone grafting lower bottom right and left  . REDUCTION MAMMAPLASTY Bilateral   . RIGHT COLECTOMY  09-10-2003   dr Margot Chimes @[facility]    multiple colon polyps (per path tubular adenoma's, hyperplastic , benign appendix, benign two lymph nodes  . ROTATOR CUFF REPAIR Left 04/2016  . TONSILLECTOMY  1969  . TUBAL LIGATION Bilateral 1982    Allergies: Ampicillin, Penicillins, Gabapentin, Other, Codeine, Hydrocodone-acetaminophen, and Hydromorphone  Medications: Prior to Admission medications   Medication Sig Start Date End Date Taking? Authorizing Provider  acetaminophen (TYLENOL) 325 MG tablet Take 2 tablets (650 mg total) by mouth every 6 (six) hours as needed for moderate pain. 06/28/19   Arrien, Jimmy Picket, MD  albuterol (VENTOLIN HFA) 108 (90 Base) MCG/ACT inhaler INHALE 2 PUFFS BY MOUTH EVERY 4 TO 6 HOURS AS NEEDED 07/09/19   Angiulli, Lavon Paganini, PA-C  allopurinol (ZYLOPRIM) 300 MG tablet TAKE 1 TABLET(300 MG) BY MOUTH DAILY 07/09/19   Angiulli, Lavon Paganini, PA-C  ALPRAZolam Duanne Moron) 0.25 MG tablet Take 1 tablet (0.25 mg total) by mouth 3 (three) times daily as needed for anxiety. 07/09/19   Angiulli, Lavon Paganini, PA-C  anastrozole (ARIMIDEX) 1 MG tablet TAKE 1 TABLET(1 MG) BY MOUTH DAILY Patient taking differently: Take 1 mg by mouth daily.  04/21/19   Magrinat, Virgie Dad, MD  atorvastatin (LIPITOR) 20 MG tablet Take 1 tablet (20 mg total) by mouth daily. 07/09/19   Angiulli, Lavon Paganini, PA-C  Biotin (BIOTIN 5000) 5 MG CAPS Take 5 mg by mouth daily.     [provider]  Calcium Carb-Cholecalciferol (CALCIUM 1000 + D PO) Take 1 tablet by mouth daily.    [provider]  colchicine 0.6 MG tablet Take 2 tablets now and 1 tablet an hour later for flare up Patient taking differently: Take 0.6-1.2 mg  by mouth daily as needed (gout).  12/20/18   Henson, Vickie L, NP-C  diclofenac Sodium (VOLTAREN) 1 % GEL Apply 4 g topically 4 (four) times daily. As needed for knee pain 03/18/19   Henson, Vickie L, NP-C  diclofenac Sodium (VOLTAREN) 1 % GEL Apply 4 g topically 4 (four) times daily. 07/09/19   Angiulli, Lavon Paganini, PA-C  diphenhydrAMINE (BENADRYL) 25 MG tablet Take 25 mg by mouth daily as needed for itching.    [provider]  docusate sodium (COLACE) 100 MG capsule Take 1 capsule (100 mg total) by mouth 3 (three) times daily. 07/09/19   Angiulli, Lavon Paganini, PA-C  DULoxetine (CYMBALTA) 60 MG capsule TAKE 1 CAPSULE(60 MG) BY MOUTH DAILY 07/09/19  Angiulli, Lavon Paganini, PA-C  furosemide (LASIX) 40 MG tablet Take 1 tablet (40 mg total) by mouth daily. 06/28/19 07/28/19  Arrien, Jimmy Picket, MD  hydrOXYzine (VISTARIL) 25 MG capsule Take 25 mg by mouth 2 (two) times daily as needed for anxiety.    [provider]  Javier Docker Oil 350 MG CAPS Take 700 mg by mouth daily.    [provider]  levothyroxine (SYNTHROID) 75 MCG tablet Take 1 tablet (75 mcg total) by mouth daily before breakfast. 07/09/19   Angiulli, Lavon Paganini, PA-C  Magnesium 500 MG CAPS Take 500 mg by mouth daily.    [provider]  magnesium gluconate (MAGONATE) 500 MG tablet Take 2 tablets (1,000 mg total) by mouth at bedtime. 07/09/19   Angiulli, Lavon Paganini, PA-C  melatonin 300 MCG TABS Take 35 tablets (10,500 mcg total) by mouth at bedtime. 07/09/19   Angiulli, Lavon Paganini, PA-C  Melatonin 5 MG/15ML LIQD Take 10 mg by mouth at bedtime.     [provider]  metaxalone (SKELAXIN) 800 MG tablet Take 1 tablet (800 mg total) by mouth 3 (three) times daily. 07/09/19   Angiulli, Lavon Paganini, PA-C  methocarbamol (ROBAXIN) 500 MG tablet Take 2 tablets (1,000 mg total) by mouth 3 (three) times daily. 07/09/19   Angiulli, Lavon Paganini, PA-C  methocarbamol (ROBAXIN-750) 750 MG tablet Take 1 tablet (750 mg total) by mouth 3 (three)  times daily as needed for muscle spasms. 05/31/19   Costella, Vista Mink, PA-C  methylPREDNISolone (MEDROL) 2 MG tablet Take two tablets 3 times daily for three days, then take two tablets 2 times daily for three days, then take two tablets daily for three days, then take one tablet daily for 3 days, then stop. 06/28/19   Arrien, Jimmy Picket, MD  metoprolol succinate (TOPROL-XL) 25 MG 24 hr tablet Take 0.5 tablets (12.5 mg total) by mouth daily. 07/09/19   Angiulli, Lavon Paganini, PA-C  morphine (MS CONTIN) 15 MG 12 hr tablet Take 1 tablet (15 mg total) by mouth every 12 (twelve) hours. 07/09/19   Angiulli, Lavon Paganini, PA-C  ondansetron (ZOFRAN ODT) 4 MG disintegrating tablet Take 1 tablet (4 mg total) by mouth every 8 (eight) hours as needed for nausea or vomiting. 06/24/19   Tysinger, Camelia Eng, PA-C  oxyCODONE (OXY IR/ROXICODONE) 5 MG immediate release tablet Take 1 tablet (5 mg total) by mouth every 4 (four) hours as needed for severe pain. 07/09/19   Angiulli, Lavon Paganini, PA-C  polyethylene glycol powder (MIRALAX) powder Take 17 g by mouth daily as needed for moderate constipation.     [provider]  primidone (MYSOLINE) 50 MG tablet TAKE 1 TABLET(50 MG) BY MOUTH AT BEDTIME 07/09/19   Angiulli, Lavon Paganini, PA-C  QUEtiapine (SEROQUEL) 25 MG tablet Take 1 tablet (25 mg total) by mouth at bedtime. 07/09/19   Angiulli, Lavon Paganini, PA-C  rOPINIRole (REQUIP) 0.25 MG tablet Take 3 tablets (0.75 mg total) by mouth at bedtime. 07/09/19   Angiulli, Lavon Paganini, PA-C  rOPINIRole (REQUIP) 0.5 MG tablet Take 1.5 tablets (0.75 mg total) by mouth at bedtime. 06/12/19   Henson, Vickie L, NP-C  sodium zirconium cyclosilicate (LOKELMA) 5 g packet Take 5 g by mouth 3 (three) times daily. 06/28/19   Arrien, Jimmy Picket, MD  vitamin B-12 (CYANOCOBALAMIN) 1000 MCG tablet Take 1 tablet (1,000 mcg total) by mouth daily. 07/09/19   Angiulli, Lavon Paganini, PA-C     Family History  Problem Relation Age of Onset  .  Atrial fibrillation  Mother   . Ulcers Father   . Breast cancer Maternal Aunt   . Lung cancer Maternal Uncle   . Stomach cancer Maternal Grandmother   . Lung cancer Maternal Aunt   . Brain cancer Maternal Aunt   . Prostate cancer Maternal Grandfather   . Stroke Paternal Grandmother   . Tuberculosis Paternal Grandfather   . Colon cancer Neg Hx   . Esophageal cancer Neg Hx   . Rectal cancer Neg Hx     Social History   Socioeconomic History  . Marital status: Married    Spouse name: robert  . Number of children: 3  . Years of education: college  . Highest education level: Not on file  Occupational History  . Occupation: retired  Tobacco Use  . Smoking status: Former Smoker    Packs/day: 0.50    Years: 35.00    Pack years: 17.50    Types: Cigarettes    Quit date: 05/22/2018    Years since quitting: 1.1  . Smokeless tobacco: Never Used  Substance and Sexual Activity  . Alcohol use: Yes    Comment: 1 glass of wine a night  . Drug use: Never    Comment: CBD tincture  . Sexual activity: Yes    Birth control/protection: Post-menopausal  Other Topics Concern  . Not on file  Social History Narrative   Lives with husband in a 2 story home.  Has 2 living children.  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  Education: college.    Right-handed.   Five cups coffee per week.   Social Determinants of Health   Financial Resource Strain:   . Difficulty of Paying Living Expenses:   Food Insecurity:   . Worried About Charity fundraiser in the Last Year:   . Arboriculturist in the Last Year:   Transportation Needs:   . Film/video editor (Medical):   Marland Kitchen Lack of Transportation (Non-Medical):   Physical Activity:   . Days of Exercise per Week:   . Minutes of Exercise per Session:   Stress:   . Feeling of Stress :   Social Connections:   . Frequency of Communication with Friends and Family:   . Frequency of Social Gatherings with Friends and Family:   . Attends Religious Services:   .  Active Member of Clubs or Organizations:   . Attends Archivist Meetings:   Marland Kitchen Marital Status:      Review of Systems: A 12 point ROS discussed and pertinent positives are indicated in the HPI above.  All other systems are negative.  Review of Systems  Vital Signs: BP (!) 174/96   Pulse 89   Temp 98.5 F (36.9 C)   Resp 17   Ht 5\' 8"  (1.727 m)   Wt 114 kg   SpO2 92%   BMI 38.21 kg/m   Physical Exam Vitals reviewed.  Constitutional:      Appearance: Normal appearance.  HENT:     Head: Normocephalic and atraumatic.  Eyes:     Extraocular Movements: Extraocular movements intact.  Cardiovascular:     Rate and Rhythm: Normal rate and regular rhythm.  Pulmonary:     Effort: Pulmonary effort is normal. No respiratory distress.     Breath sounds: Normal breath sounds.  Abdominal:     General: There is no distension.     Palpations: Abdomen is soft.     Tenderness: There is no abdominal tenderness.  Musculoskeletal:        General: Normal range of motion.  Skin:    General: Skin is warm and dry.  Neurological:     General: No focal deficit present.     Mental Status: She is alert and oriented to person, place, and time.  Psychiatric:        Mood and Affect: Mood normal.        Behavior: Behavior normal.        Thought Content: Thought content normal.        Judgment: Judgment normal.     Imaging: DG Chest 2 View  Result Date: 07/04/2019 CLINICAL DATA:  Fatigue.  Back pain and spasms EXAM: CHEST - 2 VIEW COMPARISON:  03/01/2019 FINDINGS: Mild cardiomegaly. Negative aortic and hilar contours when accounting for rightward rotation. Postoperative left breast and axilla. There is no edema, consolidation, effusion, or pneumothorax. IMPRESSION: Cardiomegaly without failure. Electronically Signed   By: Monte Fantasia M.D.   On: 07/04/2019 10:37   DG Abd 1 View  Result Date: 07/01/2019 CLINICAL DATA:  Severe abdominal pain EXAM: ABDOMEN - 1 VIEW COMPARISON:  None.  FINDINGS: Mild gaseous distention of the colon. No evidence of bowel obstruction. No organomegaly or free air. No suspicious calcification. No acute bony abnormality. Postoperative changes in the lumbar spine. IMPRESSION: Mild gaseous distention of the colon, nonspecific. This could reflect mild ileus. No evidence of bowel obstruction or free air. Electronically Signed   By: Rolm Baptise M.D.   On: 07/01/2019 12:20   MR LUMBAR SPINE WO CONTRAST  Result Date: 06/25/2019 CLINICAL DATA:  Initial evaluation for acute left leg weakness. Recent surgery on 05/30/2019. EXAM: MRI LUMBAR SPINE WITHOUT CONTRAST TECHNIQUE: Multiplanar, multisequence MR imaging of the lumbar spine was performed. No intravenous contrast was administered. COMPARISON:  Previous MRI from 03/31/2019. FINDINGS: Segmentation: Standard. Lowest well-formed disc space labeled the L5-S1 level. Alignment: Trace retrolisthesis of L1 on L2. Alignment otherwise normal with preservation of the normal lumbar lordosis. Vertebrae: Postoperative changes from recent extension of posterior decompression and fusion to the L2-3 level, with sequelae of prior PLIF now seen at this level. There is prominent marrow edema within the L2 and L3 vertebral bodies. Edema extends into the adjacent psoas musculature bilaterally, left greater than right (series 10, image 16). While these findings may be postoperative in nature, appearance raises the concern for possible infection/osteomyelitis. There is question of a circumferential epidural collection at the level of L2-3 (series 10, image 15), difficult to see given adjacent susceptibility artifact. Compression of the thecal sac with persistent fairly severe spinal stenosis. Slight inferior migration of epidural collection within the ventral epidural space to the level of L3 (series 10, image 19). Elsewhere, the remainder of the fusion extending from L3 through S1 is stable in appearance. Vertebral body height maintained  without acute or interval fracture. No discrete osseous lesions. No other abnormal marrow edema. Conus medullaris and cauda equina: Conus extends to the superior aspect of L1. Partially visualized conus and distal cord normal in appearance. Proximal nerve roots of the cauda equina somewhat irregular and undulating superior to the L2-3 level secondary to stenosis. Paraspinal and other soft tissues: Postoperative changes from recent posterior decompression with fusion at L2-3. Diffuse edema seen throughout the posterior paraspinous musculature. There is a residual collection along the mid posterior soft tissues extending from approximately L3-4 through L5-S1. This measures approximately 5.0 x 4.7 x 7.4 cm (series 10, image 29). Collection is heterogeneous with a few scattered  internal fluid-fluid levels, which could reflect complex postoperative seroma or hematoma. Superimposed infection not excluded. Additional smaller more simple appearing collection within the subcutaneous fat along the midline incision measures 2.0 x 1.6 x 4.3 cm (series 10, image 28). Edema within the left greater than right psoas musculature adjacent to the L2-3 interspace as above. No definite loculated collection within either psoas muscle, although evaluation limited given lack of IV contrast. Disc levels: T12-L1: Unremarkable. L1-2: Mild diffuse disc bulge with disc desiccation. Disc bulging eccentric to the left. Bilateral facet hypertrophy. Progressive mild spinal stenosis. Mild left L1 foraminal narrowing. No significant right foraminal stenosis. L2-3: Postoperative changes from interval decompressive laminectomy with facetectomy with posterior and interbody fusion. Suspected circumferential epidural collection compresses the thecal sac. Persistent severe spinal stenosis, with the thecal sac measuring approximately 7 mm in AP I diameter at its most narrow point. Foramina are grossly patent. L3-4: Prior PLIF. Mild spinal stenosis related  to the posterior soft tissue collection. Foramina remain grossly patent. L4-5: Prior PLIF. Mild to moderate spinal stenosis related to the posterior soft tissue collection. Foramina remain patent. L5-S1: Prior PLIF. Thecal sac patency grossly stable. No new foraminal narrowing. IMPRESSION: 1. Postoperative changes from recent extension of posterior decompression and fusion to the L2-3 level. Prominent marrow edema within the L2 and L3 vertebral bodies with soft tissue edema within the adjacent psoas musculature as above. While these findings may in part be postoperative in nature, appearance raises the concern for possible infection/osteomyelitis. Correlation with symptomatology and laboratory values recommended. Additionally, follow-up examination with postcontrast imaging may be helpful for further evaluation as warranted. 2. Persistent severe stenosis at the level of L2-3 with question circumferential epidural collection as above. Finding difficult to be certain given adjacent susceptibility artifact. Again, postcontrast imaging may be helpful. Additionally, correlation with dedicated CT with contrast could also be performed as well. 3. Approximate 5.0 x 4.7 x 7.4 cm heterogeneous collection within the posterior paraspinous soft tissues extending from L3 through L5-S1. Differential considerations include a postoperative hematoma versus complicated seroma. Superimposed infection could be considered in the correct clinical setting. Electronically Signed   By: Jeannine Boga M.D.   On: 06/25/2019 02:56   CT ABDOMEN PELVIS W CONTRAST  Result Date: 07/09/2019 CLINICAL DATA:  74 year old female with left lower quadrant abdominal pain. EXAM: CT ABDOMEN AND PELVIS WITH CONTRAST TECHNIQUE: Multidetector CT imaging of the abdomen and pelvis was performed using the standard protocol following bolus administration of intravenous contrast. CONTRAST:  160mL OMNIPAQUE IOHEXOL 300 MG/ML  SOLN COMPARISON:  CT abdomen  pelvis dated 06/24/2019. FINDINGS: Lower chest: Minimal bibasilar subpleural thickening or scarring. The visualized lung bases are otherwise clear. No intra-abdominal free air or free fluid. Hepatobiliary: Apparent fatty infiltration of the liver. No intrahepatic biliary ductal dilatation. The gallbladder is unremarkable. Pancreas: Unremarkable. No pancreatic ductal dilatation or surrounding inflammatory changes. Spleen: Normal in size without focal abnormality. Adrenals/Urinary Tract: The adrenal glands are unremarkable. There is no hydronephrosis on either side. There is symmetric enhancement and excretion of contrast by both kidneys. There is a 1 cm left renal cyst and subcentimeter hypodensities which are too small to characterize. The visualized ureters and urinary bladder appear unremarkable. Stomach/Bowel: There is sigmoid diverticulosis without active inflammatory changes. Mild thickened appearance of the distal esophagus, likely related to underdistention. Mild esophagitis is not excluded. Clinical correlation is recommended. There is no bowel obstruction. Postsurgical changes with ileocolic anastomosis the right lower quadrant. Appendectomy. Vascular/Lymphatic: Advanced aortoiliac atherosclerotic disease. The IVC is  unremarkable. No portal venous gas. There is no adenopathy. Reproductive: Hysterectomy. No adnexal masses. Other: None Musculoskeletal: Multilevel degenerative changes of the spine L2-S1 disc spacer and posterior fusion. There is grade 1 L2-L3 retrolisthesis with progression of the posterior slippage of the disc spacer at L2-L3 compared to the prior CT. The L2 fusion screws breach the superior endplate of L2 similar to prior CT. There is bone lucency adjacent to the L2 screws in the posterior elements, left greater right and progressed since the prior CT. There is thickening of the prevertebral soft tissues at L2-L3 with several ill-defined small loculated fluid in the adjacent soft tissues and  in the psoas musculature most consistent with small abscesses. There is irregularity of the L2 inferior endplate and L3 superior endplate. Posterior hardware is intact. No acute osseous pathology. IMPRESSION: 1. Extensive postsurgical changes of the lumbar spine with posterior fusion at L2-S1. Interval posterior slippage of the disc spacer at L2-L3 with findings of loosening of the left L2 screw. There are findings of infection and small paraspinal abscesses at L2-L3. 2. Sigmoid diverticulosis. No bowel obstruction. 3. Aortic Atherosclerosis (ICD10-I70.0). Electronically Signed   By: Anner Crete M.D.   On: 07/09/2019 20:40   US RENAL  Result Date: 07/04/2019 CLINICAL DATA:  Pain at LEFT flank for 2 weeks, UTI EXAM: RENAL / URINARY TRACT ULTRASOUND COMPLETE COMPARISON:  None FINDINGS: Right Kidney: Renal measurements: 11.2 x 4.7 x 7.5 cm = volume: 206 mL. Mild cortical thinning. Normal cortical echogenicity. No mass, hydronephrosis, or shadowing calcification. Left Kidney: Renal measurements: 10.5 x 6.2 x 6.1 cm = volume: 208 mL. Mild cortical thinning. Normal cortical echogenicity. Small cyst at inferior pole 2.2 x 1.9 x 1.4 cm. No additional mass, hydronephrosis or shadowing calcification. Bladder: Appears normal for degree of bladder distention. Other: Echogenic hepatic parenchyma, likely reflecting fatty infiltration, without obvious nodularity or mass to suggest cirrhosis. IMPRESSION: LEFT renal cyst 2.2 cm greatest size. No other renal sonographic abnormalities identified. Suspected fatty infiltration of liver. Electronically Signed   By: Lavonia Dana M.D.   On: 07/04/2019 16:55   DG Abd Portable 1V  Result Date: 07/06/2019 CLINICAL DATA:  Ileus EXAM: PORTABLE ABDOMEN - 1 VIEW COMPARISON:  July 01, 2019 FINDINGS: The lung bases are normal. No free air, portal venous gas, or pneumatosis. Air seen throughout the length of the colon. No convincing evidence of obstruction or ileus. No other acute  abnormalities. IMPRESSION: No convincing evidence of ileus or obstruction. No acute abnormalities are identified. Electronically Signed   By: Dorise Bullion III M.D   On: 07/06/2019 09:35   CT RENAL STONE STUDY  Result Date: 06/24/2019 CLINICAL DATA:  Atypical back pain EXAM: CT ABDOMEN AND PELVIS WITHOUT CONTRAST TECHNIQUE: Multidetector CT imaging of the abdomen and pelvis was performed following the standard protocol without IV contrast. COMPARISON:  CT 02/20/2018 FINDINGS: Lower chest: Lung bases demonstrate no acute consolidation or effusion. Heart size within normal limits. Hepatobiliary: No focal liver abnormality is seen. No gallstones, gallbladder wall thickening, or biliary dilatation. Pancreas: Unremarkable. No pancreatic ductal dilatation or surrounding inflammatory changes. Spleen: Normal in size without focal abnormality. Adrenals/Urinary Tract: Adrenal glands are normal. Kidneys show no hydronephrosis. Probable cyst within the mid to lower left kidney. No ureteral stone is seen. The bladder is unremarkable. Stomach/Bowel: Stomach is nonenlarged. No dilated small bowel. Postsurgical changes of the right colon. No bowel wall thickening. Scattered diverticular disease without acute inflammatory process. Vascular/Lymphatic: Extensive aortic atherosclerosis. No aneurysm. No significant adenopathy.  Reproductive: Status post hysterectomy. No adnexal masses. Other: Negative for free air or free fluid. Musculoskeletal: Posterior fusion changes L2 through S1 with interbody devices at L2-L3, L3-L4, L4-L5 and L5-S1. Breach of the superior endplate by fixating screws on both sides at L2 with some lucency surrounding the screws. IMPRESSION: 1. Negative for hydronephrosis or ureteral stone. 2. Diverticular disease of the colon without acute inflammatory process 3. Post fusion changes of the lumbar spine L2 through S1. Fixating screws at L2 breech the superior endplate on both sides and there appears to be some  lucency surrounding the fixating screws. Hardware otherwise appears intact. Electronically Signed   By: Donavan Foil M.D.   On: 06/24/2019 22:44   VAS [place] LOWER EXTREMITY VENOUS (DVT)  Result Date: 06/30/2019  Lower Venous DVTStudy Indications: Swelling, and Edema.  Comparison Study: no prior Performing Technologist: Abram Sander RVS  Examination Guidelines: A complete evaluation includes B-mode imaging, spectral Doppler, color Doppler, and power Doppler as needed of all accessible portions of each vessel. Bilateral testing is considered an integral part of a complete examination. Limited examinations for reoccurring indications may be performed as noted. The reflux portion of the exam is performed with the patient in reverse Trendelenburg.  +---------+---------------+---------+-----------+----------+--------------+ RIGHT    CompressibilityPhasicitySpontaneityPropertiesThrombus Aging +---------+---------------+---------+-----------+----------+--------------+ CFV      Full           Yes      Yes                                 +---------+---------------+---------+-----------+----------+--------------+ SFJ      Full                                                        +---------+---------------+---------+-----------+----------+--------------+ FV Prox  Full                                                        +---------+---------------+---------+-----------+----------+--------------+ FV Mid   Full                                                        +---------+---------------+---------+-----------+----------+--------------+ FV DistalFull                                                        +---------+---------------+---------+-----------+----------+--------------+ PFV      Full                                                        +---------+---------------+---------+-----------+----------+--------------+ POP      Full           Yes  Yes                                  +---------+---------------+---------+-----------+----------+--------------+ PTV      Full                                                        +---------+---------------+---------+-----------+----------+--------------+ PERO     Full                                                        +---------+---------------+---------+-----------+----------+--------------+   +---------+---------------+---------+-----------+----------+--------------+ LEFT     CompressibilityPhasicitySpontaneityPropertiesThrombus Aging +---------+---------------+---------+-----------+----------+--------------+ CFV      Full           Yes      Yes                                 +---------+---------------+---------+-----------+----------+--------------+ SFJ      Full                                                        +---------+---------------+---------+-----------+----------+--------------+ FV Prox  Full                                                        +---------+---------------+---------+-----------+----------+--------------+ FV Mid   Full                                                        +---------+---------------+---------+-----------+----------+--------------+ FV DistalFull                                                        +---------+---------------+---------+-----------+----------+--------------+ PFV      Full                                                        +---------+---------------+---------+-----------+----------+--------------+ POP      Full           Yes      Yes                                 +---------+---------------+---------+-----------+----------+--------------+ PTV      Full                                                        +---------+---------------+---------+-----------+----------+--------------+  PERO     Full                                                         +---------+---------------+---------+-----------+----------+--------------+     Summary: BILATERAL: - No evidence of deep vein thrombosis seen in the lower extremities, bilaterally.   *See table(s) above for measurements and observations. Electronically signed by Harold Barban MD on 06/30/2019 at 4:19:52 PM.    Final     Labs:  CBC: Recent Labs    07/07/19 0546 07/08/19 0542 07/09/19 0548 07/10/19 0512  WBC 7.6 10.3 8.5 9.3  HGB 8.3* 9.4* 8.5* 8.9*  HCT 26.7* 30.1* 27.4* 28.8*  PLT 370 422* 317 325    COAGS: Recent Labs    03/01/19 1854 03/02/19 0550 05/31/19 0633  INR 1.0 1.0 1.0  APTT 27  --  25    BMP: Recent Labs    07/07/19 0546 07/08/19 0542 07/09/19 0548 07/10/19 0512  NA 140 140 139 136  K 4.0 3.6 3.7 3.3*  CL 110 107 109 101  CO2 22 23 23 26   GLUCOSE 136* 135* 146* 146*  BUN 7* <5* 7* <5*  CALCIUM 8.9 9.3 8.8* 9.0  CREATININE 0.96 0.80 0.74 0.73  GFRNONAA 59* >60 >60 >60  GFRAA >60 >60 >60 >60    LIVER FUNCTION TESTS: Recent Labs    07/07/19 0546 07/08/19 0542 07/09/19 0548 07/10/19 0512  BILITOT 0.3 0.4 0.6 0.4  AST 24 32 25 23  ALT 29 36 29 29  ALKPHOS 90 93 78 89  PROT 5.5* 6.2* 5.4* 5.9*  ALBUMIN 2.2* 2.5* 2.2* 2.4*    TUMOR MARKERS: No results for input(s): AFPTM, CEA, CA199, CHROMGRNA in the last 8760 hours.  Assessment and Plan:  Lumbar spinal stenosis post recent decompression and fusion 05/30/2019 per Dr. Kathyrn Sheriff.  Now with small paraspinal abscesses at L2-L3.  Will proceed with image guided  Aspiration tomorrow by Dr. Vernard Gambles.  Risks and benefits of aspiration was discussed with the patient and/or patient's family including, but not limited to bleeding, infection, damage to adjacent structures or low yield requiring additional tests.  All of the questions were answered and there is agreement to proceed.  Consent signed and in chart.  Thank you for this interesting consult.  I greatly enjoyed meeting LYNSI DEANES and  look forward to participating in their care.  A copy of this report was sent to the requesting provider on this date.  Electronically Signed: Murrell Redden, PA-C   07/10/2019, 2:48 PM      I spent a total of 40 Minutes   in face to face in clinical consultation, greater than 50% of which was counseling/coordinating care for spinal abscess aspiration.

## 2019-07-10 NOTE — Progress Notes (Addendum)
[place] PHYSICAL MEDICINE & REHABILITATION PROGRESS NOTE   Subjective/Complaints: Patient seen laying in bed this morning.  She states she slept well overnight.  She notes she had some spasms after transfers for CT.  Patient seen with neurosurgery, discussed imaging with neurosurgery.  ROS: + Left lower quadrant pain, improving.  Denies CP, SOB, N/V/D  Objective:   CT ABDOMEN PELVIS W CONTRAST  Result Date: 07/09/2019 CLINICAL DATA:  74 year old female with left lower quadrant abdominal pain. EXAM: CT ABDOMEN AND PELVIS WITH CONTRAST TECHNIQUE: Multidetector CT imaging of the abdomen and pelvis was performed using the standard protocol following bolus administration of intravenous contrast. CONTRAST:  11mL OMNIPAQUE IOHEXOL 300 MG/ML  SOLN COMPARISON:  CT abdomen pelvis dated 06/24/2019. FINDINGS: Lower chest: Minimal bibasilar subpleural thickening or scarring. The visualized lung bases are otherwise clear. No intra-abdominal free air or free fluid. Hepatobiliary: Apparent fatty infiltration of the liver. No intrahepatic biliary ductal dilatation. The gallbladder is unremarkable. Pancreas: Unremarkable. No pancreatic ductal dilatation or surrounding inflammatory changes. Spleen: Normal in size without focal abnormality. Adrenals/Urinary Tract: The adrenal glands are unremarkable. There is no hydronephrosis on either side. There is symmetric enhancement and excretion of contrast by both kidneys. There is a 1 cm left renal cyst and subcentimeter hypodensities which are too small to characterize. The visualized ureters and urinary bladder appear unremarkable. Stomach/Bowel: There is sigmoid diverticulosis without active inflammatory changes. Mild thickened appearance of the distal esophagus, likely related to underdistention. Mild esophagitis is not excluded. Clinical correlation is recommended. There is no bowel obstruction. Postsurgical changes with ileocolic anastomosis the right lower quadrant.  Appendectomy. Vascular/Lymphatic: Advanced aortoiliac atherosclerotic disease. The IVC is unremarkable. No portal venous gas. There is no adenopathy. Reproductive: Hysterectomy. No adnexal masses. Other: None Musculoskeletal: Multilevel degenerative changes of the spine L2-S1 disc spacer and posterior fusion. There is grade 1 L2-L3 retrolisthesis with progression of the posterior slippage of the disc spacer at L2-L3 compared to the prior CT. The L2 fusion screws breach the superior endplate of L2 similar to prior CT. There is bone lucency adjacent to the L2 screws in the posterior elements, left greater right and progressed since the prior CT. There is thickening of the prevertebral soft tissues at L2-L3 with several ill-defined small loculated fluid in the adjacent soft tissues and in the psoas musculature most consistent with small abscesses. There is irregularity of the L2 inferior endplate and L3 superior endplate. Posterior hardware is intact. No acute osseous pathology. IMPRESSION: 1. Extensive postsurgical changes of the lumbar spine with posterior fusion at L2-S1. Interval posterior slippage of the disc spacer at L2-L3 with findings of loosening of the left L2 screw. There are findings of infection and small paraspinal abscesses at L2-L3. 2. Sigmoid diverticulosis. No bowel obstruction. 3. Aortic Atherosclerosis (ICD10-I70.0). Electronically Signed   By: Anner Crete M.D.   On: 07/09/2019 20:40   Recent Labs    07/09/19 0548 07/10/19 0512  WBC 8.5 9.3  HGB 8.5* 8.9*  HCT 27.4* 28.8*  PLT 317 325   Recent Labs    07/09/19 0548 07/10/19 0512  NA 139 136  K 3.7 3.3*  CL 109 101  CO2 23 26  GLUCOSE 146* 146*  BUN 7* <5*  CREATININE 0.74 0.73  CALCIUM 8.8* 9.0    Intake/Output Summary (Last 24 hours) at 07/10/2019 1051 Last data filed at 07/10/2019 0805 Gross per 24 hour  Intake 10 ml  Output -  Net 10 ml     Physical Exam: Vital  Signs Blood pressure (!) 174/96, pulse 89,  temperature 98.5 F (36.9 C), resp. rate 17, height 5\' 8"  (1.727 m), weight 114 kg, SpO2 92 %. Constitutional: No distress . Vital signs reviewed.  Obese. HENT: Normocephalic.  Atraumatic. Eyes: EOMI. No discharge. Cardiovascular: No JVD. Respiratory: Normal effort.  No stridor. GI: Non-distended. Skin: Warm and dry.  Intact. Psych: Normal mood.  Normal behavior. Musc: No edema in extremities.  No tenderness in extremities. Neurological: Alert Cooperative with exam. Motor: Bilateral upper extremities: 4+/5 proximal distal Right lower extremities: 4-4+/5 proximal distal, unchanged Left lower extremity: Hip flexion, knee extension 4 --4/5, distally 4+/5  Assessment/Plan: 1. Functional deficits secondary to acute on chronic back pain S/P recent L2-L3 fusion 05/30/2019. which require 3+ hours per day of interdisciplinary therapy in a comprehensive inpatient rehab setting.  Physiatrist is providing close team supervision and 24 hour management of active medical problems listed below.  Physiatrist and rehab team continue to assess barriers to discharge/monitor patient progress toward functional and medical goals  Care Tool:  Bathing    Body parts bathed by patient: Right arm, Left arm, Chest, Abdomen, Face, Front perineal area, Right upper leg, Left upper leg, Buttocks, Right lower leg, Left lower leg   Body parts bathed by helper: Right lower leg, Left lower leg Body parts n/a: Front perineal area, Buttocks, Right upper leg, Left upper leg, Right lower leg   Bathing assist Assist Level: Supervision/Verbal cueing(used long bath sponge)     Upper Body Dressing/Undressing Upper body dressing Upper body dressing/undressing activity did not occur (including orthotics): N/A What is the patient wearing?: Bra, Pull over shirt    Upper body assist Assist Level: Set up assist    Lower Body Dressing/Undressing Lower body dressing      What is the patient wearing?: Pants, Underwear/pull  up     Lower body assist Assist for lower body dressing: Minimal Assistance - Patient > 75%     Toileting Toileting    Toileting assist Assist for toileting: Minimal Assistance - Patient > 75% Assistive Device Comment: RW   Transfers Chair/bed transfer  Transfers assist     Chair/bed transfer assist level: Supervision/Verbal cueing Chair/bed transfer assistive device: Programmer, multimedia   Ambulation assist      Assist level: Supervision/Verbal cueing Assistive device: Walker-rolling Max distance: 235'   Walk 10 feet activity   Assist     Assist level: Supervision/Verbal cueing Assistive device: Walker-rolling   Walk 50 feet activity   Assist    Assist level: Supervision/Verbal cueing Assistive device: Walker-rolling    Walk 150 feet activity   Assist    Assist level: Supervision/Verbal cueing Assistive device: Walker-rolling    Walk 10 feet on uneven surface  activity   Assist     Assist level: Supervision/Verbal cueing Assistive device: Aeronautical engineer Will patient use wheelchair at discharge?: No             Wheelchair 50 feet with 2 turns activity    Assist            Wheelchair 150 feet activity     Assist          Blood pressure (!) 174/96, pulse 89, temperature 98.5 F (36.9 C), resp. rate 17, height 5\' 8"  (1.727 m), weight 114 kg, SpO2 92 %.  Medical Problem List and Plan: 1.  Decreased functional mobility secondary to spinal stenosis S/P recent L2-L3 fusion 05/30/2019.  Continue CIR   Plan was for patient to discharge today, however given findings of recent CT, personally reviewed-questionable infection, discussed with Neurosurg, ID consulted.  Will await eval and further recs  Team conference today to discuss current and goals and coordination of care, home and environmental barriers, and discharge planning with nursing, case manager, and therapies.  2.   Antithrombotics: -DVT/anticoagulation: SCDs.               Vascular study negative for DVT             -antiplatelet therapy: N/A 3. Pain Management: Voltaren gel 4 times daily, Cymbalta 60 mg daily, Robaxin 3 times daily, MS Contin 15 mg every 12 hours, oxycodone as needed for breakthrough pain  low scheduled dose of tizanidine  Stable with meds on 3/24 4. Mood: Provide emotional support             -antipsychotic agents: N/A  Seroquel 25mg  HS.  5. Neuropsych: This patient is capable of making decisions on her own behalf. 6. Skin/Wound Care: Routine skin checks 7. Fluids/Electrolytes/Nutrition: Routine in and outs.   8.  Acute on chronic anemia.    Hemoglobin 8.9 on 3/24  Continue to monitor 9.  AKI on?  CKD stage III: Resolved  10.  Hypertension.  Toprol-XL 12.5 mg daily restarted on 3/23.               Monitor with increased mobility  Labile on 3/24 11.  Obesity.  BMI 38.67.  Dietary follow-up 12.  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  Follow-up outpatient Dr. Jana Hakim.  Continue Armidex 13.  Hyperlipidemia.  Lipitor 14.  History of gout.  Continue Zyloprim. 15.  Hypothyroidism.  TSH 1.420.  Continue Synthroid 16.  Chronic constipation.    Adjust bowel meds as necessary 17.  Emphysema/tobacco abuse.  Check oxygen saturations every shift  Continue to counsel on smoking cessation 18.  Tremors with restless leg syndrome.  Continue Requip as well as Mysoline 19. Hyperkalemia  Potassium 3.3 on 3/24  Supplement initiated 20. LLQ pain-   Renal US no evidence of hydronephrosis.  CT abdomen pelvis ordered on 3/23, personally reviewed, see #1 21. Fatigue/pallor: Appreciate NSGY follow-up.   Blood cultures no growth  Urine cultures with 20,000 growth of lactobacillus 22.  Radiation induced tooth decay/Thursh:   Nystatin swish and swallow ordered. Daughter will schedule outpatient dentistry follow-up.  23.  Prediabetes  Monitor with increased  mobility  Elevated on 3/24 24.  Hypoalbuminemia  Supplement initiated on 3/22  LOS: 12 days A FACE TO FACE EVALUATION WAS PERFORMED  Ankit Lorie Phenix 07/10/2019, 10:51 AM

## 2019-07-10 NOTE — Patient Care Conference (Signed)
Inpatient RehabilitationTeam Conference and Plan of Care Update Date: 07/10/2019   Time: 11:15 AM    Patient Name: Brandi Stephens      Medical Record Number: [id number]  Date of Birth: January 06, 1946 Sex: Female         Room/Bed: 4M07C/4M07C-01 Payor Info: Payor: [employer]imer[employer] / Plan: [employer] Must PPO / Product Type: *No Product type* /    Admit Date/Time:  06/28/2019  4:12 PM  Primary Diagnosis:  Acute osteomyelitis of lumbar spine [facility])  Patient Active Problem List   Diagnosis Date Noted  . Status post lumbar spinal fusion 07/10/2019  . Postoperative pain   . Acute blood loss anemia   . Labile blood pressure   . Tobacco abuse   . Hypoalbuminemia due to protein-calorie malnutrition ([place])   . Anxiety 07/05/2019  . Depression 07/05/2019  . Chronic diastolic CHF (congestive heart failure) ([place]) 07/05/2019  . Obesity (BMI 30-39.9) 07/05/2019  . ETOH abuse 07/05/2019  . Lumbar radiculopathy 06/28/2019  . Spinal stenosis of lumbar region 06/28/2019  . Acute on chronic anemia   . Tremors of nervous system   . AKI (acute kidney injury) ([place])   . Chronic pain syndrome   . Acute renal failure superimposed on stage 3 chronic kidney disease ([place]) 06/25/2019  . Acute osteomyelitis of lumbar spine ([place]) 06/25/2019  . Weakness of left lower extremity   . Abdominal pain 06/24/2019  . Nausea 06/24/2019  . Abnormal urine odor 06/24/2019  . History of sepsis 06/24/2019  . Recent major surgery 06/24/2019  . History of diverticulosis 06/24/2019  . Elevated PTHrP level 05/09/2019  . Paresthesia 02/12/2019  . Low back pain 02/12/2019  . Neuropathy 12/20/2018  . Lumbar spinal stenosis 06/29/2018  . Preoperative clearance 06/04/2018  . Abnormal CT of the chest 06/04/2018  . Smoker 05/17/2018  . Hypothyroidism 11/30/2017  . Thoracic aortic atherosclerosis ([place]) 09/14/2016  . Benign essential tremor 09/09/2016  . RLS (restless legs syndrome) 09/09/2016  . H/O adenomatous polyp  of colon 07/06/2016  . PVC's (premature ventricular contractions) 03/29/2016  . Preoperative cardiovascular examination 03/29/2016  . Grade I diastolic dysfunction 02/18/1593  . Palpitations 01/11/2016  . Shortness of breath 01/11/2016  . Former cigarette smoker 01/11/2016  . Leg edema 01/11/2016  . Chronic cough 10/28/2015  . 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 Malignant neoplasm of upper-inner quadrant of left breast in female, estrogen receptor positive ([place]) 07/14/2015  . Prediabetes 07/08/2015  . CKD (chronic kidney disease), stage III 07/08/2015  . Dependent edema 06/08/2015  . Sleep disturbance 06/08/2015  . Mixed hyperlipidemia 06/08/2015  . Generalized anxiety disorder 06/08/2015  . Essential hypertension 06/08/2015  . COPD (chronic obstructive pulmonary disease) ([place]) 06/08/2015  . Gout 06/08/2015  . Macrocytic anemia   . 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 Sepsis ([place]) 05/15/2015  . Lumbar spondylosis 03/20/2015    Expected Discharge Date: Expected Discharge Date: 07/10/19  Team Members Present: Physician leading conference: Dr. Delice Lesch Care Coodinator Present: Nestor Lewandowsky, RN, BSN, CRRN;Genie Lynn Sissel, RN, MSN Nurse Present: Toy Cookey, LPN PT Present: Michaelene Song, PT OT Present: Meriel Pica, OT SLP Present: Stormy Fabian, SLP PPS Coordinator present : Gunnar Fusi, Novella Olive, PT     Current Status/Progress Goal Weekly Team Focus  Bowel/Bladder   continent of bowel and bladder  maintain bowel      Swallow/Nutrition/ Hydration             ADL's   See DC summary  Mobility   See DC summary        Communication             Safety/Cognition/ Behavioral Observations            Pain   scheduled and PRN pain management  maintain pain < 3      Skin   back incision healing  No infection       Rehab Goals Patient on target to meet rehab goals: Yes *See Care Plan and progress notes for long and short-term goals.      Barriers to Discharge  Current Status/Progress Possible Resolutions Date Resolved   Nursing                  PT                    OT                  SLP                SW Home environment access/layout B+B upstairs (14 steps) Patient is doing well, discharge scheduled for 07/11/19; home with spouse and daughter assisting as needed          Discharge Planning/Teaching Needs:  Home with spouse and daughter  TBD; transfers, toileting, medications, etc   Team Discussion: MD stable, CT yesterday, ID to see, DC planned for today on hold pending ID review.  RN oxy given for back pain, has a midline IV.  OT DC summary done yesterday, met goals.  PT see DC summary from yesterday, met goals.   Revisions to Treatment Plan: N/A     Medical Summary Current Status: Decreased functional mobility secondary to spinal stenosis S/P recent L2-L3 fusion 05/30/2019 Weekly Focus/Goal: Improve mobility, pain, essential HTN, hypokalemia, CBGs, LLQ pain  Barriers to Discharge: Medical stability   Possible Resolutions to Barriers: Therapies, optimize pain meds, follow Neurosurg/ID recs, supplement K+   Continued Need for Acute Rehabilitation Level of Care: The patient requires daily medical management by a physician with specialized training in physical medicine and rehabilitation for the following reasons: Direction of a multidisciplinary physical rehabilitation program to maximize functional independence : Yes Medical management of patient stability for increased activity during participation in an intensive rehabilitation regime.: Yes Analysis of laboratory values and/or radiology reports with any subsequent need for medication adjustment and/or medical intervention. : Yes   I attest that I was present, lead the team conference, and concur with the assessment and plan of the team.   Retta Diones 07/10/2019, 8:54 PM   Team conference was held via web/ teleconference due to [place] - 19

## 2019-07-10 NOTE — Progress Notes (Signed)
  NEUROSURGERY PROGRESS NOTE   Patient seen for social visit. Overall feels improved. She is extemely grateful for her CIR team, as are we. Repeat CT abd from yesterday was reviewed and does show some slippage of the left L2-3 cage, loosening of screws at L2. Per Dr Kathyrn Sheriff, this is concerning for possible underlying infection. Would consult ID.  He does not believe she needs to stay from a NS perspective in house, but defers to ID evaluation.

## 2019-07-10 NOTE — Care Management (Addendum)
   The overall goal for the admission was met for:   Discharge location: Home with husband  Length of Stay: 13 days with original discharge 07/10/19 cancelled due to medical issues. Transferred to acute 07/11/19  Discharge activity level:Patient to discharge at an ambulatory level Supervision Car transfer requires minA for LLE management.  Home/community participation: Clinical research associate provided included: MD, RD, PT, OT, RN, CM, TR, Pharmacy, Neuropsych and SW  Financial Services: Private Insurance: [employer]  Follow-up services arranged: Home Health: [facility] and Patient/Family has no preference for HH/DME agencies  [place] made aware of need for [facility] RN 2/2 IV abx and consult to [facility] with [facility] 07/11/19.  Comments (or additional information): [phone]  Patient/Family verbalized understanding of follow-up arrangements: Yes  Individual responsible for coordination of the follow-up plan: Spouse: Herbie [place] [phone] Daughter: [name] [phone]  Confirmed correct DME delivered:No equipment provided. Pt already has all necessary DME.    Margarito Liner

## 2019-07-10 NOTE — Consult Note (Signed)
[place] for Infectious Disease    Date of Admission:  06/28/2019          Reason for Consult: Probable acute, postoperative lumbar infection    Referring Provider: Dr. Delice Lesch  Assessment: Her clinical, radiographic and laboratory findings are all compatible with postoperative lumbar infection.  Ideally, it would be best to see if IR or neurosurgery can aspirate around L2-3 and send specimens for Gram stain and culture.  She has had relatively high risk for infection with drug-resistant pathogens given prior exposure to antibiotics.  She also has a penicillin allergy.  All of these factors make selection of a safe and effective empiric antibiotic regimen quite difficult.  Plan: 1. Recommend asking IR/neurosurgery  aspiration of L2-3 with specimens sent for Gram stain and culture 2. Hold off on antibiotic therapy for now  Principal Problem:   Acute osteomyelitis of lumbar spine ([place]) Active Problems:   Status post lumbar spinal fusion   Mixed hyperlipidemia   Essential hypertension   COPD (chronic obstructive pulmonary disease) (HCC)   Malignant neoplasm of upper-inner quadrant of left breast in female, estrogen receptor positive (HCC)   PVC's (premature ventricular contractions)   RLS (restless legs syndrome)   Hypothyroidism   Lumbar spinal stenosis   Weakness of left lower extremity   Acute on chronic anemia   AKI (acute kidney injury) ([place])   Spinal stenosis of lumbar region   Anxiety   Depression   Chronic diastolic CHF (congestive heart failure) (HCC)   Obesity (BMI 30-39.9)   ETOH abuse   Labile blood pressure   Tobacco abuse   Hypoalbuminemia due to protein-calorie malnutrition (HCC)   Postoperative pain   Acute blood loss anemia   Scheduled Meds: . allopurinol  300 mg Oral Daily  . alum & mag hydroxide-simeth  15 mL Oral TID  . anastrozole  1 mg Oral Daily  . atorvastatin  20 mg Oral q1800  . diclofenac Sodium  4 g Topical QID  .  docusate sodium  100 mg Oral TID  . DULoxetine  60 mg Oral Daily  . feeding supplement (PRO-STAT SUGAR FREE 64)  30 mL Oral BID  . levothyroxine  75 mcg Oral Q0600  . magnesium gluconate  1,000 mg Oral QHS  . melatonin  10.5 mg Oral QHS  . metaxalone  800 mg Oral TID  . methocarbamol  1,000 mg Oral TID  . metoprolol succinate  12.5 mg Oral Daily  . morphine  15 mg Oral Q12H  . nystatin  5 mL Oral QID  . polyethylene glycol  17 g Oral BID  . potassium chloride  20 mEq Oral Daily  . primidone  50 mg Oral QHS  . QUEtiapine  25 mg Oral QHS  . rOPINIRole  0.75 mg Oral QHS  . senna  2 tablet Oral QHS  . sodium chloride flush  10-40 mL Intracatheter Q12H  . vitamin B-12  1,000 mcg Oral Daily   Continuous Infusions: . sodium chloride 100 mL/hr at 07/09/19 0205   PRN Meds:.acetaminophen, albuterol, ALPRAZolam, ondansetron **OR** ondansetron (ZOFRAN) IV, oxyCODONE, sodium chloride flush, sorbitol  HPI: ARNITRA LABRAKE is a 74 y.o. female with a history of lumbar stenosis and radiculopathy.  She underwent a third lumbar surgery on 05/30/2019.  She had decompression of L2-3 with extension of her previous fusion from L2-S1.  She was discharged home the following day.  She says that she had severe pain right  after surgery that was unlike anything she had experienced with previous surgeries.  She said she felt like her back was swollen although there was no visible swelling, redness or unusual drainage from her incision.  The pain progressively worsened.  After about a week she also began to notice some left lower quadrant pain that she said felt like "ovarian pain".  She was treated with 2 rounds of steroids without relief.  Because of worsening pain she was admitted to the hospital on 06/24/2019.  She has noted worsening pain and muscle spasms.  Because of the left lower quadrant pain she was evaluated for possible UTI.  She has not had any dysuria.  She was treated with ciprofloxacin from March 18-22  without any change in her pain.  Repeat CT scan obtained yesterday revealed:  Musculoskeletal: Multilevel degenerative changes of the spine L2-S1 disc spacer and posterior fusion. There is grade 1 L2-L3 retrolisthesis with progression of the posterior slippage of the disc spacer at L2-L3 compared to the prior CT. The L2 fusion screws breach the superior endplate of L2 similar to prior CT. There is bone lucency adjacent to the L2 screws in the posterior elements, left greater right and progressed since the prior CT. There is thickening of the prevertebral soft tissues at L2-L3 with several ill-defined small loculated fluid in the adjacent soft tissues and in the psoas musculature most consistent with small abscesses. There is irregularity of the L2 inferior endplate and L3 superior endplate. Posterior hardware is intact. No acute osseous pathology.  IMPRESSION: 1. Extensive postsurgical changes of the lumbar spine with posterior fusion at L2-S1. Interval posterior slippage of the disc spacer at L2-L3 with findings of loosening of the left L2 screw. There are findings of infection and small paraspinal abscesses at L2-L3.   Review of Systems: Review of Systems  Constitutional: Positive for malaise/fatigue. Negative for chills, diaphoresis and fever.  Respiratory: Negative for cough and shortness of breath.   Cardiovascular: Negative for chest pain.  Gastrointestinal: Positive for abdominal pain. Negative for diarrhea, nausea and vomiting.  Genitourinary: Negative for dysuria.  Musculoskeletal: Positive for back pain.    Past Medical History:  Diagnosis Date  . AIN (anal intraepithelial neoplasia) anal canal   . Anemia    with pregnancy  . Anxiety   . Arthritis    knees, lower back, knees  . Chronic constipation   . Chronic diastolic CHF (congestive heart failure) ([place]) 07/05/2019  . CKD (chronic kidney disease)   . Coarse tremors    Essential  . COPD (chronic obstructive  pulmonary disease) ([place])    2017 chest CT, history of  . Degenerative lumbar spinal stenosis   . Depression   . Depression 07/05/2019  . Drug dependency ([place])   . Elevated PTHrP level 05/09/2019  . ETOH abuse 07/05/2019  . GERD (gastroesophageal reflux disease)    pt denies  . Gout   . Grade I diastolic dysfunction 0000000   Noted ECHO  . Hemorrhoids   . History of adenomatous polyp of colon   . History of basal cell carcinoma (BCC) excision 2015   nose  . History of sepsis 05/14/2014   post lumbar surgery  . History of shingles    x2  . Hyperlipidemia   . Hypertension   . Hypothyroidism   . Irregular heart beat    history of while undergoing radiation  . Malignant neoplasm of upper-inner quadrant of left breast in female, estrogen receptor positive [facility]) oncologist-  dr  magrinat--  per lov in epic no recurrence   dx 07-13-2015  Left breast invasive ductal carcinoma, Stage IA, Grade 1 (TXN0),  09-16-2015  s/p  left breast lumpectomy with sln bx's,  completed radiation 11-18-2015,  started antiestrogen therapy 12-14-2015  . Neuropathy    Left foot and back of left leg  . Obesity (BMI 30-39.9) 07/05/2019  . Personal history of radiation therapy    completed 11-18-2015  left breast  . Pneumonia   . PONV (postoperative nausea and vomiting)   . Prediabetes    diet controlled, no med  . Restless leg syndrome   . Seasonal allergies   . Seizure ([place]) 05/2015   due to sepsis, only one time episode  . Tremor   . Wears partial dentures    lower     Social History   Tobacco Use  . Smoking status: Former Smoker    Packs/day: 0.50    Years: 35.00    Pack years: 17.50    Types: Cigarettes    Quit date: 05/22/2018    Years since quitting: 1.1  . Smokeless tobacco: Never Used  Substance Use Topics  . Alcohol use: Yes    Comment: 1 glass of wine a night  . Drug use: Never    Comment: CBD tincture    Family History  Problem Relation Age of Onset  . Atrial fibrillation  Mother   . Ulcers Father   . Breast cancer Maternal Aunt   . Lung cancer Maternal Uncle   . Stomach cancer Maternal Grandmother   . Lung cancer Maternal Aunt   . Brain cancer Maternal Aunt   . Prostate cancer Maternal Grandfather   . Stroke Paternal Grandmother   . Tuberculosis Paternal Grandfather   . Colon cancer Neg Hx   . Esophageal cancer Neg Hx   . Rectal cancer Neg Hx    Allergies  Allergen Reactions  . Ampicillin Hives and Other (See Comments)    Severe reaction in February 2017 1.5 month to have hives to go away  . Penicillins Hives and Other (See Comments)    Severe reaction in February 2017 1.5 month to have hives to go away Has patient had a PCN reaction causing immediate rash, facial/tongue/throat swelling, SOB or lightheadedness with hypotension: #  #  #  YES  #  #  #  Has patient had a PCN reaction causing severe rash involving mucus membranes or skin necrosis: No Has patient had a PCN reaction that required hospitalization No Has patient had a PCN reaction occurring within the last 10 years: No   . Gabapentin Swelling    UNSPECIFIED REACTION   . Other Itching    UNSPECIFIED Analgesics  . Codeine Nausea And Vomiting  . Hydrocodone-Acetaminophen Itching  . Hydromorphone Itching    Pt has taken w/o issues    OBJECTIVE: Blood pressure (!) 174/96, pulse 89, temperature 98.5 F (36.9 C), resp. rate 17, height 5\' 8"  (1.727 m), weight 114 kg, SpO2 92 %.  Physical Exam Constitutional:      Comments: She is resting quietly on her left side trying not to move.  Her daughter is at the bedside.  Musculoskeletal:     Comments: Her lumbar incision has healed.  Psychiatric:        Mood and Affect: Mood normal.     Lab Results Lab Results  Component Value Date   WBC 9.3 07/10/2019   HGB 8.9 (L) 07/10/2019   HCT 28.8 (L) 07/10/2019  MCV 98.3 07/10/2019   PLT 325 07/10/2019    Lab Results  Component Value Date   CREATININE 0.73 07/10/2019   BUN <5 (L)  07/10/2019   NA 136 07/10/2019   K 3.3 (L) 07/10/2019   CL 101 07/10/2019   CO2 26 07/10/2019    Lab Results  Component Value Date   ALT 29 07/10/2019   AST 23 07/10/2019   ALKPHOS 89 07/10/2019   BILITOT 0.4 07/10/2019   Sed Rate (mm/hr)  Date Value  06/25/2019 136 (H)  02/12/2019 6   CRP  Date Value  06/25/2019 27.3 mg/dL (H)  02/12/2019 <1 mg/L     Microbiology: Recent Results (from the past 240 hour(s))  Culture, Urine     Status: Abnormal   Collection Time: 07/04/19  9:27 AM   Specimen: Urine, Random  Result Value Ref Range Status   Specimen Description URINE, RANDOM  Final   Special Requests   Final    NONE Performed at [facility], [address] [place], [place] [address]    Culture MULTIPLE SPECIES PRESENT, SUGGEST RECOLLECTION (A)  Final   Report Status 07/05/2019 FINAL  Final  Culture, blood (routine x 2)     Status: None   Collection Time: 07/04/19  1:53 PM   Specimen: BLOOD  Result Value Ref Range Status   Specimen Description BLOOD RIGHT ANTECUBITAL  Final   Special Requests   Final    BOTTLES DRAWN AEROBIC AND ANAEROBIC Blood Culture adequate volume Performed at [facility], [place] [address] [place], [place] [address]    Culture NO GROWTH 5 DAYS  Final   Report Status 07/09/2019 FINAL  Final  Culture, blood (routine x 2)     Status: None   Collection Time: 07/04/19  1:53 PM   Specimen: BLOOD  Result Value Ref Range Status   Specimen Description BLOOD RIGHT ANTECUBITAL  Final   Special Requests   Final    BOTTLES DRAWN AEROBIC AND ANAEROBIC Blood Culture adequate volume Performed at [facility], [place] [address] [place], [place] [address]    Culture NO GROWTH 5 DAYS  Final   Report Status 07/09/2019 FINAL  Final  Culture, Urine     Status: Abnormal   Collection Time: 07/05/19  8:31 PM   Specimen: Urine, Random  Result Value Ref Range Status   Specimen Description URINE, RANDOM  Final   Special Requests   Final     NONE Performed at [facility], [place] [address], [place], [place] [address]    Culture (A)  Final    20,000 COLONIES/mL LACTOBACILLUS SPECIES Standardized susceptibility testing for this organism is not available.    Report Status 07/07/2019 FINAL  Final    [name], MD [facility] [phone] pager   [phone] cell 07/10/2019, 1:16 PM

## 2019-07-10 NOTE — Plan of Care (Signed)
  Problem: Consults Goal: RH GENERAL PATIENT EDUCATION Description: See Patient Education module for education specifics. Outcome: Progressing   Problem: RH BOWEL ELIMINATION Goal: RH STG MANAGE BOWEL WITH ASSISTANCE Description: STG Manage Bowel with MOD IAssistance. Outcome: Progressing Goal: RH STG MANAGE BOWEL W/MEDICATION W/ASSISTANCE Description: STG Manage Bowel with Medication with MIN Assistance. Outcome: Progressing   Problem: RH BLADDER ELIMINATION Goal: RH STG MANAGE BLADDER WITH ASSISTANCE Description: STG Manage Bladder With MOD I Assistance 07/10/2019 1113 by Amanda Cockayne, LPN Outcome: Progressing 07/10/2019 1113 by Amanda Cockayne, LPN Reactivated   Problem: RH SKIN INTEGRITY Goal: RH STG SKIN FREE OF INFECTION/BREAKDOWN Description: Skin will be free of infection/breakdown while in rehab 07/10/2019 1113 by Amanda Cockayne, LPN Outcome: Progressing 07/10/2019 1113 by Amanda Cockayne, LPN Reactivated Goal: RH STG MAINTAIN SKIN INTEGRITY WITH ASSISTANCE Description: STG Maintain Skin Integrity With MIN Assistance. 07/10/2019 1113 by Amanda Cockayne, LPN Outcome: Progressing 07/10/2019 1113 by Amanda Cockayne, LPN Reactivated   Problem: RH SAFETY Goal: RH STG ADHERE TO SAFETY PRECAUTIONS W/ASSISTANCE/DEVICE Description: STG Adhere to Safety Precautions With MOD I Assistance/Device. Outcome: Progressing Goal: RH STG DECREASED RISK OF FALL WITH ASSISTANCE Description: STG Decreased Risk of Fall With [place]. Outcome: Progressing   Problem: RH PAIN MANAGEMENT Goal: RH STG PAIN MANAGED AT OR BELOW PT'S PAIN GOAL Description: Pt will report less than 3 out of 10 on pain scale with PRN medications Outcome: Progressing   Problem: RH KNOWLEDGE DEFICIT GENERAL Goal: RH STG INCREASE KNOWLEDGE OF SELF CARE AFTER HOSPITALIZATION Outcome: Progressing

## 2019-07-10 NOTE — Discharge Instructions (Signed)
Inpatient Rehab Discharge Instructions  REILYNN DEHERRERA Discharge date and time: No discharge date for patient encounter.   Activities/Precautions/ Functional Status: Activity: activity as tolerated Diet: regular diet Wound Care: none needed Functional status:  ___ No restrictions     ___ Walk up steps independently ___ 24/7 supervision/assistance   ___ Walk up steps with assistance ___ Intermittent supervision/assistance  ___ Bathe/dress independently ___ Walk with walker     _x__ Bathe/dress with assistance ___ Walk Independently    ___ Shower independently ___ Walk with assistance    ___ Shower with assistance ___ No alcohol     ___ Return to work/school ________   COMMUNITY REFERRALS UPON DISCHARGE:  Home Health: PT,OT  Agency:[facility] Phone:[phone]  Special Instructions: No driving smoking or alcohol   My questions have been answered and I understand these instructions. I will adhere to these goals and the provided educational materials after my discharge from the hospital.  Patient/Caregiver Signature _______________________________ Date __________  Clinician Signature _______________________________________ Date __________  Please bring this form and your medication list with you to all your follow-up doctor's appointments.

## 2019-07-11 ENCOUNTER — Inpatient Hospital Stay (HOSPITAL_COMMUNITY)
Admission: AD | Admit: 2019-07-11 | Discharge: 2019-08-02 | DRG: 496 | Disposition: A | Discharge status: Rehabilitation Facility | Payer: PPO | Source: Intra-hospital | Attending: Neurosurgery | Admitting: Neurosurgery

## 2019-07-11 ENCOUNTER — Inpatient Hospital Stay (HOSPITAL_COMMUNITY): Payer: PPO

## 2019-07-11 DIAGNOSIS — G2581 Restless legs syndrome: Secondary | ICD-10-CM | POA: Diagnosis not present

## 2019-07-11 DIAGNOSIS — Z981 Arthrodesis status: Secondary | ICD-10-CM | POA: Diagnosis not present

## 2019-07-11 DIAGNOSIS — M4646 Discitis, unspecified, lumbar region: Secondary | ICD-10-CM | POA: Diagnosis present

## 2019-07-11 DIAGNOSIS — L02212 Cutaneous abscess of back [any part, except buttock]: Secondary | ICD-10-CM | POA: Diagnosis not present

## 2019-07-11 DIAGNOSIS — Z85828 Personal history of other malignant neoplasm of skin: Secondary | ICD-10-CM

## 2019-07-11 DIAGNOSIS — Z801 Family history of malignant neoplasm of trachea, bronchus and lung: Secondary | ICD-10-CM

## 2019-07-11 DIAGNOSIS — E669 Obesity, unspecified: Secondary | ICD-10-CM | POA: Diagnosis not present

## 2019-07-11 DIAGNOSIS — E039 Hypothyroidism, unspecified: Secondary | ICD-10-CM | POA: Diagnosis present

## 2019-07-11 DIAGNOSIS — J449 Chronic obstructive pulmonary disease, unspecified: Secondary | ICD-10-CM | POA: Diagnosis present

## 2019-07-11 DIAGNOSIS — Y792 Prosthetic and other implants, materials and accessory orthopedic devices associated with adverse incidents: Secondary | ICD-10-CM | POA: Diagnosis present

## 2019-07-11 DIAGNOSIS — Z885 Allergy status to narcotic agent status: Secondary | ICD-10-CM | POA: Diagnosis not present

## 2019-07-11 DIAGNOSIS — Z888 Allergy status to other drugs, medicaments and biological substances status: Secondary | ICD-10-CM | POA: Diagnosis not present

## 2019-07-11 DIAGNOSIS — M5416 Radiculopathy, lumbar region: Secondary | ICD-10-CM | POA: Diagnosis not present

## 2019-07-11 DIAGNOSIS — T8140XA Infection following a procedure, unspecified, initial encounter: Secondary | ICD-10-CM | POA: Diagnosis not present

## 2019-07-11 DIAGNOSIS — K219 Gastro-esophageal reflux disease without esophagitis: Secondary | ICD-10-CM | POA: Diagnosis present

## 2019-07-11 DIAGNOSIS — D649 Anemia, unspecified: Secondary | ICD-10-CM | POA: Diagnosis not present

## 2019-07-11 DIAGNOSIS — E785 Hyperlipidemia, unspecified: Secondary | ICD-10-CM | POA: Diagnosis not present

## 2019-07-11 DIAGNOSIS — M62838 Other muscle spasm: Secondary | ICD-10-CM | POA: Diagnosis not present

## 2019-07-11 DIAGNOSIS — Z803 Family history of malignant neoplasm of breast: Secondary | ICD-10-CM

## 2019-07-11 DIAGNOSIS — T8463XA Infection and inflammatory reaction due to internal fixation device of spine, initial encounter: Secondary | ICD-10-CM | POA: Diagnosis not present

## 2019-07-11 DIAGNOSIS — I129 Hypertensive chronic kidney disease with stage 1 through stage 4 chronic kidney disease, or unspecified chronic kidney disease: Secondary | ICD-10-CM | POA: Diagnosis not present

## 2019-07-11 DIAGNOSIS — N179 Acute kidney failure, unspecified: Secondary | ICD-10-CM | POA: Diagnosis not present

## 2019-07-11 DIAGNOSIS — T84296A Other mechanical complication of internal fixation device of vertebrae, initial encounter: Secondary | ICD-10-CM | POA: Diagnosis not present

## 2019-07-11 DIAGNOSIS — Z79899 Other long term (current) drug therapy: Secondary | ICD-10-CM | POA: Diagnosis not present

## 2019-07-11 DIAGNOSIS — G8918 Other acute postprocedural pain: Secondary | ICD-10-CM | POA: Diagnosis not present

## 2019-07-11 DIAGNOSIS — G894 Chronic pain syndrome: Secondary | ICD-10-CM | POA: Diagnosis not present

## 2019-07-11 DIAGNOSIS — J302 Other seasonal allergic rhinitis: Secondary | ICD-10-CM | POA: Diagnosis present

## 2019-07-11 DIAGNOSIS — Z923 Personal history of irradiation: Secondary | ICD-10-CM

## 2019-07-11 DIAGNOSIS — R2689 Other abnormalities of gait and mobility: Secondary | ICD-10-CM | POA: Diagnosis not present

## 2019-07-11 DIAGNOSIS — T84216A Breakdown (mechanical) of internal fixation device of vertebrae, initial encounter: Secondary | ICD-10-CM | POA: Diagnosis not present

## 2019-07-11 DIAGNOSIS — Z419 Encounter for procedure for purposes other than remedying health state, unspecified: Secondary | ICD-10-CM

## 2019-07-11 DIAGNOSIS — Z6838 Body mass index (BMI) 38.0-38.9, adult: Secondary | ICD-10-CM

## 2019-07-11 DIAGNOSIS — Z823 Family history of stroke: Secondary | ICD-10-CM

## 2019-07-11 DIAGNOSIS — Z79890 Hormone replacement therapy (postmenopausal): Secondary | ICD-10-CM

## 2019-07-11 DIAGNOSIS — I5032 Chronic diastolic (congestive) heart failure: Secondary | ICD-10-CM | POA: Diagnosis present

## 2019-07-11 DIAGNOSIS — M549 Dorsalgia, unspecified: Secondary | ICD-10-CM | POA: Diagnosis not present

## 2019-07-11 DIAGNOSIS — Z880 Allergy status to penicillin: Secondary | ICD-10-CM

## 2019-07-11 DIAGNOSIS — G8929 Other chronic pain: Secondary | ICD-10-CM | POA: Diagnosis not present

## 2019-07-11 DIAGNOSIS — E876 Hypokalemia: Secondary | ICD-10-CM | POA: Diagnosis not present

## 2019-07-11 DIAGNOSIS — Z808 Family history of malignant neoplasm of other organs or systems: Secondary | ICD-10-CM

## 2019-07-11 DIAGNOSIS — M4626 Osteomyelitis of vertebra, lumbar region: Secondary | ICD-10-CM | POA: Diagnosis not present

## 2019-07-11 DIAGNOSIS — M869 Osteomyelitis, unspecified: Secondary | ICD-10-CM | POA: Diagnosis not present

## 2019-07-11 DIAGNOSIS — Z87891 Personal history of nicotine dependence: Secondary | ICD-10-CM

## 2019-07-11 DIAGNOSIS — R410 Disorientation, unspecified: Secondary | ICD-10-CM | POA: Diagnosis not present

## 2019-07-11 DIAGNOSIS — E46 Unspecified protein-calorie malnutrition: Secondary | ICD-10-CM | POA: Diagnosis not present

## 2019-07-11 DIAGNOSIS — M48061 Spinal stenosis, lumbar region without neurogenic claudication: Secondary | ICD-10-CM | POA: Diagnosis not present

## 2019-07-11 DIAGNOSIS — K5909 Other constipation: Secondary | ICD-10-CM | POA: Diagnosis present

## 2019-07-11 DIAGNOSIS — T8189XA Other complications of procedures, not elsewhere classified, initial encounter: Secondary | ICD-10-CM | POA: Diagnosis not present

## 2019-07-11 DIAGNOSIS — N189 Chronic kidney disease, unspecified: Secondary | ICD-10-CM | POA: Diagnosis present

## 2019-07-11 DIAGNOSIS — E8809 Other disorders of plasma-protein metabolism, not elsewhere classified: Secondary | ICD-10-CM | POA: Diagnosis not present

## 2019-07-11 DIAGNOSIS — Z800 Family history of malignant neoplasm of digestive organs: Secondary | ICD-10-CM

## 2019-07-11 DIAGNOSIS — Z853 Personal history of malignant neoplasm of breast: Secondary | ICD-10-CM | POA: Diagnosis not present

## 2019-07-11 DIAGNOSIS — F419 Anxiety disorder, unspecified: Secondary | ICD-10-CM | POA: Diagnosis present

## 2019-07-11 DIAGNOSIS — N183 Chronic kidney disease, stage 3 unspecified: Secondary | ICD-10-CM | POA: Diagnosis not present

## 2019-07-11 DIAGNOSIS — I10 Essential (primary) hypertension: Secondary | ICD-10-CM | POA: Diagnosis not present

## 2019-07-11 DIAGNOSIS — M170 Bilateral primary osteoarthritis of knee: Secondary | ICD-10-CM | POA: Diagnosis not present

## 2019-07-11 DIAGNOSIS — I130 Hypertensive heart and chronic kidney disease with heart failure and stage 1 through stage 4 chronic kidney disease, or unspecified chronic kidney disease: Secondary | ICD-10-CM | POA: Diagnosis not present

## 2019-07-11 DIAGNOSIS — I493 Ventricular premature depolarization: Secondary | ICD-10-CM | POA: Diagnosis not present

## 2019-07-11 DIAGNOSIS — Z881 Allergy status to other antibiotic agents status: Secondary | ICD-10-CM | POA: Diagnosis not present

## 2019-07-11 DIAGNOSIS — M4326 Fusion of spine, lumbar region: Secondary | ICD-10-CM | POA: Diagnosis not present

## 2019-07-11 DIAGNOSIS — D62 Acute posthemorrhagic anemia: Secondary | ICD-10-CM | POA: Diagnosis not present

## 2019-07-11 DIAGNOSIS — R0989 Other specified symptoms and signs involving the circulatory and respiratory systems: Secondary | ICD-10-CM | POA: Diagnosis not present

## 2019-07-11 DIAGNOSIS — Z4789 Encounter for other orthopedic aftercare: Secondary | ICD-10-CM | POA: Diagnosis not present

## 2019-07-11 DIAGNOSIS — E782 Mixed hyperlipidemia: Secondary | ICD-10-CM | POA: Diagnosis not present

## 2019-07-11 DIAGNOSIS — T84038A Mechanical loosening of other internal prosthetic joint, initial encounter: Secondary | ICD-10-CM | POA: Diagnosis present

## 2019-07-11 LAB — PHOSPHORUS: Phosphorus: 3.5 mg/dL (ref 2.5–4.6)

## 2019-07-11 MED ORDER — PHENOL 1.4 % MT LIQD: 1.0000 | OROMUCOSAL | Status: DC | PRN

## 2019-07-11 MED ORDER — ONDANSETRON HCL 4 MG PO TABS: 4.0000 mg | ORAL_TABLET | Freq: Four times a day (QID) | ORAL | Status: DC | PRN

## 2019-07-11 MED ORDER — VANCOMYCIN HCL 1750 MG/350ML IV SOLN: 1750.0000 mg | INTRAVENOUS | Status: DC

## 2019-07-11 MED ORDER — POLYETHYLENE GLYCOL 3350 17 G PO PACK
17.0000 g | PACK | Freq: Every day | ORAL | Status: DC | PRN
  Administered 2019-07-12 – 2019-07-31 (×7): 17 g via ORAL
  Filled 2019-07-11 (×7): qty 1

## 2019-07-11 MED ORDER — HYDROCODONE-ACETAMINOPHEN 5-325 MG PO TABS
1.0000 | ORAL_TABLET | ORAL | Status: DC | PRN
  Administered 2019-07-12 – 2019-08-02 (×11): 1 via ORAL
  Filled 2019-07-11 (×13): qty 1

## 2019-07-11 MED ORDER — SENNOSIDES-DOCUSATE SODIUM 8.6-50 MG PO TABS
1.0000 | ORAL_TABLET | Freq: Every evening | ORAL | Status: DC | PRN
  Administered 2019-07-16: 1 via ORAL

## 2019-07-11 MED ORDER — POTASSIUM CHLORIDE CRYS ER 20 MEQ PO TBCR: 20.0000 meq | EXTENDED_RELEASE_TABLET | Freq: Every day | ORAL | Status: DC

## 2019-07-11 MED ORDER — OXYCODONE HCL 5 MG PO TABS: 5.0000 mg | ORAL_TABLET | ORAL | Status: DC | PRN

## 2019-07-11 MED ORDER — ZOLPIDEM TARTRATE 5 MG PO TABS: 5.0000 mg | ORAL_TABLET | Freq: Every evening | ORAL | Status: DC | PRN

## 2019-07-11 MED ORDER — FENTANYL CITRATE (PF) 100 MCG/2ML IJ SOLN
INTRAMUSCULAR | Status: AC
  Filled 2019-07-11: qty 6

## 2019-07-11 MED ORDER — HYDROMORPHONE HCL 1 MG/ML IJ SOLN
0.5000 mg | INTRAMUSCULAR | Status: DC | PRN
  Administered 2019-07-21 (×2): 0.5 mg via INTRAVENOUS
  Administered 2019-07-23 – 2019-07-29 (×6): 1 mg via INTRAVENOUS
  Filled 2019-07-11 (×10): qty 1

## 2019-07-11 MED ORDER — SODIUM CHLORIDE 0.9% FLUSH: 3.0000 mL | INTRAVENOUS | Status: DC | PRN

## 2019-07-11 MED ORDER — VITAMIN B-12 1000 MCG PO TABS
1000.0000 ug | ORAL_TABLET | Freq: Every day | ORAL | Status: DC
  Administered 2019-07-12 – 2019-08-02 (×20): 1000 ug via ORAL
  Filled 2019-07-11 (×21): qty 1

## 2019-07-11 MED ORDER — LEVOTHYROXINE SODIUM 75 MCG PO TABS
75.0000 ug | ORAL_TABLET | Freq: Every day | ORAL | Status: DC
  Administered 2019-07-12 – 2019-08-02 (×22): 75 ug via ORAL
  Filled 2019-07-11 (×22): qty 1

## 2019-07-11 MED ORDER — METHOCARBAMOL 1000 MG/10ML IJ SOLN: 500.0000 mg | Freq: Four times a day (QID) | INTRAVENOUS | Status: DC | PRN

## 2019-07-11 MED ORDER — MAGNESIUM GLUCONATE 500 MG PO TABS
1000.0000 mg | ORAL_TABLET | Freq: Every day | ORAL | Status: DC
  Administered 2019-07-11 – 2019-08-01 (×22): 1000 mg via ORAL
  Filled 2019-07-11 (×23): qty 2

## 2019-07-11 MED ORDER — KETOROLAC TROMETHAMINE 15 MG/ML IJ SOLN: 15.0000 mg | Freq: Four times a day (QID) | INTRAMUSCULAR | Status: DC

## 2019-07-11 MED ORDER — FENTANYL CITRATE (PF) 100 MCG/2ML IJ SOLN
INTRAMUSCULAR | Status: AC | PRN
  Administered 2019-07-11 (×3): 50 ug via INTRAVENOUS

## 2019-07-11 MED ORDER — PRIMIDONE 50 MG PO TABS
50.0000 mg | ORAL_TABLET | Freq: Every day | ORAL | Status: DC
  Administered 2019-07-11 – 2019-08-01 (×22): 50 mg via ORAL
  Filled 2019-07-11 (×22): qty 1

## 2019-07-11 MED ORDER — POTASSIUM CHLORIDE CRYS ER 20 MEQ PO TBCR
20.0000 meq | EXTENDED_RELEASE_TABLET | Freq: Every day | ORAL | Status: DC
  Administered 2019-07-12 – 2019-07-24 (×12): 20 meq via ORAL
  Filled 2019-07-11 (×13): qty 1

## 2019-07-11 MED ORDER — SENNA 8.6 MG PO TABS
1.0000 | ORAL_TABLET | Freq: Two times a day (BID) | ORAL | Status: DC
  Administered 2019-07-11 – 2019-08-02 (×37): 8.6 mg via ORAL
  Filled 2019-07-11 (×40): qty 1

## 2019-07-11 MED ORDER — ANASTROZOLE 1 MG PO TABS
1.0000 mg | ORAL_TABLET | Freq: Every day | ORAL | Status: DC
  Administered 2019-07-12 – 2019-08-02 (×21): 1 mg via ORAL
  Filled 2019-07-11 (×22): qty 1

## 2019-07-11 MED ORDER — ACETAMINOPHEN 325 MG PO TABS
650.0000 mg | ORAL_TABLET | ORAL | Status: DC | PRN
  Administered 2019-07-11 – 2019-07-22 (×3): 650 mg via ORAL
  Filled 2019-07-11 (×3): qty 2

## 2019-07-11 MED ORDER — DULOXETINE HCL 60 MG PO CPEP
60.0000 mg | ORAL_CAPSULE | Freq: Every day | ORAL | Status: DC
  Administered 2019-07-12 – 2019-08-02 (×22): 60 mg via ORAL
  Filled 2019-07-11 (×22): qty 1

## 2019-07-11 MED ORDER — ANASTROZOLE 1 MG PO TABS: 1.0000 mg | ORAL_TABLET | Freq: Every day | ORAL | Status: DC

## 2019-07-11 MED ORDER — POLYETHYLENE GLYCOL 3350 17 G PO PACK: 17.0000 g | PACK | Freq: Every day | ORAL | Status: DC | PRN

## 2019-07-11 MED ORDER — FLEET ENEMA 7-19 GM/118ML RE ENEM: 1.0000 | ENEMA | Freq: Once | RECTAL | Status: DC | PRN

## 2019-07-11 MED ORDER — MENTHOL 3 MG MT LOZG: 1.0000 | LOZENGE | OROMUCOSAL | Status: DC | PRN

## 2019-07-11 MED ORDER — NALOXONE HCL 0.4 MG/ML IJ SOLN
INTRAMUSCULAR | Status: AC
  Filled 2019-07-11: qty 1

## 2019-07-11 MED ORDER — ATORVASTATIN CALCIUM 10 MG PO TABS
20.0000 mg | ORAL_TABLET | Freq: Every day | ORAL | Status: DC
  Administered 2019-07-12 – 2019-08-02 (×20): 20 mg via ORAL
  Filled 2019-07-11 (×22): qty 2

## 2019-07-11 MED ORDER — DOCUSATE SODIUM 100 MG PO CAPS
100.0000 mg | ORAL_CAPSULE | Freq: Three times a day (TID) | ORAL | Status: DC
  Administered 2019-07-11 – 2019-08-02 (×58): 100 mg via ORAL
  Filled 2019-07-11 (×61): qty 1

## 2019-07-11 MED ORDER — SODIUM CHLORIDE 0.9 % IV SOLN: INTRAVENOUS | Status: DC

## 2019-07-11 MED ORDER — METAXALONE 800 MG PO TABS
800.0000 mg | ORAL_TABLET | Freq: Three times a day (TID) | ORAL | Status: DC
  Administered 2019-07-11 – 2019-07-30 (×52): 800 mg via ORAL
  Filled 2019-07-11 (×56): qty 1

## 2019-07-11 MED ORDER — ALLOPURINOL 300 MG PO TABS
300.0000 mg | ORAL_TABLET | Freq: Every day | ORAL | Status: DC
  Administered 2019-07-12 – 2019-08-02 (×20): 300 mg via ORAL
  Filled 2019-07-11 (×22): qty 1

## 2019-07-11 MED ORDER — METHOCARBAMOL 500 MG PO TABS: 500.0000 mg | ORAL_TABLET | Freq: Four times a day (QID) | ORAL | Status: DC | PRN

## 2019-07-11 MED ORDER — ROPINIROLE HCL 0.5 MG PO TABS: 0.7500 mg | ORAL_TABLET | Freq: Every day | ORAL | Status: DC

## 2019-07-11 MED ORDER — MIDAZOLAM HCL 2 MG/2ML IJ SOLN
INTRAMUSCULAR | Status: AC
  Filled 2019-07-11: qty 6

## 2019-07-11 MED ORDER — FLUMAZENIL 0.5 MG/5ML IV SOLN
INTRAVENOUS | Status: AC
  Filled 2019-07-11: qty 5

## 2019-07-11 MED ORDER — LEVOTHYROXINE SODIUM 75 MCG PO TABS: 75.0000 ug | ORAL_TABLET | Freq: Every day | ORAL | Status: DC

## 2019-07-11 MED ORDER — HYDROXYZINE PAMOATE 25 MG PO CAPS: 25.0000 mg | ORAL_CAPSULE | Freq: Two times a day (BID) | ORAL | Status: DC | PRN

## 2019-07-11 MED ORDER — MORPHINE SULFATE ER 15 MG PO TBCR
15.0000 mg | EXTENDED_RELEASE_TABLET | Freq: Two times a day (BID) | ORAL | Status: DC
  Administered 2019-07-11 – 2019-08-02 (×43): 15 mg via ORAL
  Filled 2019-07-11 (×43): qty 1

## 2019-07-11 MED ORDER — ACETAMINOPHEN 325 MG PO TABS: 650.0000 mg | ORAL_TABLET | Freq: Four times a day (QID) | ORAL | Status: DC | PRN

## 2019-07-11 MED ORDER — FUROSEMIDE 40 MG PO TABS: 40.0000 mg | ORAL_TABLET | Freq: Every day | ORAL | Status: DC

## 2019-07-11 MED ORDER — METHOCARBAMOL 500 MG PO TABS
1000.0000 mg | ORAL_TABLET | Freq: Three times a day (TID) | ORAL | Status: DC
  Administered 2019-07-11 – 2019-07-30 (×53): 1000 mg via ORAL
  Filled 2019-07-11 (×54): qty 2

## 2019-07-11 MED ORDER — ROPINIROLE HCL 0.5 MG PO TABS
0.7500 mg | ORAL_TABLET | Freq: Every day | ORAL | Status: DC
  Administered 2019-07-11 – 2019-08-01 (×22): 0.75 mg via ORAL
  Filled 2019-07-11 (×23): qty 1

## 2019-07-11 MED ORDER — ALPRAZOLAM 0.25 MG PO TABS: 0.2500 mg | ORAL_TABLET | Freq: Three times a day (TID) | ORAL | Status: DC | PRN

## 2019-07-11 MED ORDER — VANCOMYCIN HCL 2000 MG/400ML IV SOLN
2000.0000 mg | Freq: Once | INTRAVENOUS | Status: DC
  Filled 2019-07-11: qty 400

## 2019-07-11 MED ORDER — QUETIAPINE FUMARATE 50 MG PO TABS
25.0000 mg | ORAL_TABLET | Freq: Every day | ORAL | Status: DC
  Administered 2019-07-11 – 2019-08-01 (×22): 25 mg via ORAL
  Filled 2019-07-11 (×22): qty 1

## 2019-07-11 MED ORDER — DOCUSATE SODIUM 100 MG PO CAPS: 100.0000 mg | ORAL_CAPSULE | Freq: Two times a day (BID) | ORAL | Status: DC

## 2019-07-11 MED ORDER — ONDANSETRON HCL 4 MG/2ML IJ SOLN
4.0000 mg | Freq: Four times a day (QID) | INTRAMUSCULAR | Status: DC | PRN
  Administered 2019-07-12 – 2019-07-23 (×9): 4 mg via INTRAVENOUS
  Filled 2019-07-11 (×9): qty 2

## 2019-07-11 MED ORDER — MIDAZOLAM HCL 2 MG/2ML IJ SOLN
INTRAMUSCULAR | Status: AC | PRN
  Administered 2019-07-11 (×3): 1 mg via INTRAVENOUS

## 2019-07-11 MED ORDER — BISACODYL 10 MG RE SUPP
10.0000 mg | Freq: Every day | RECTAL | Status: DC | PRN
  Administered 2019-07-20 – 2019-07-31 (×2): 10 mg via RECTAL
  Filled 2019-07-11 (×2): qty 1

## 2019-07-11 MED ORDER — MAGNESIUM 500 MG PO CAPS: 500.0000 mg | ORAL_CAPSULE | Freq: Every day | ORAL | Status: DC

## 2019-07-11 MED ORDER — SODIUM CHLORIDE 0.9 % IV SOLN
2.0000 g | Freq: Three times a day (TID) | INTRAVENOUS | Status: DC
  Filled 2019-07-11 (×2): qty 2

## 2019-07-11 MED ORDER — ACETAMINOPHEN 500 MG PO TABS
1000.0000 mg | ORAL_TABLET | Freq: Four times a day (QID) | ORAL | Status: AC
  Administered 2019-07-11 – 2019-07-12 (×3): 1000 mg via ORAL
  Filled 2019-07-11 (×3): qty 2

## 2019-07-11 MED ORDER — ONDANSETRON HCL 4 MG PO TABS
4.0000 mg | ORAL_TABLET | Freq: Four times a day (QID) | ORAL | Status: DC | PRN
  Administered 2019-07-15: 4 mg via ORAL
  Filled 2019-07-11: qty 1

## 2019-07-11 MED ORDER — ONDANSETRON HCL 4 MG/2ML IJ SOLN: 4.0000 mg | Freq: Four times a day (QID) | INTRAMUSCULAR | Status: DC | PRN

## 2019-07-11 MED ORDER — SODIUM CHLORIDE 0.9% FLUSH
3.0000 mL | Freq: Two times a day (BID) | INTRAVENOUS | Status: DC
  Administered 2019-07-12: 3 mL via INTRAVENOUS

## 2019-07-11 MED ORDER — METOPROLOL SUCCINATE ER 25 MG PO TB24: 12.5000 mg | ORAL_TABLET | Freq: Every day | ORAL | Status: DC

## 2019-07-11 MED ORDER — LIDOCAINE HCL 1 % IJ SOLN
INTRAMUSCULAR | Status: AC
  Filled 2019-07-11: qty 20

## 2019-07-11 MED ORDER — PRIMIDONE 50 MG PO TABS: 50.0000 mg | ORAL_TABLET | Freq: Every day | ORAL | Status: DC

## 2019-07-11 MED ORDER — ACETAMINOPHEN 650 MG RE SUPP: 650.0000 mg | RECTAL | Status: DC | PRN

## 2019-07-11 MED ORDER — POLYETHYLENE GLYCOL 3350 17 GM/SCOOP PO POWD: 17.0000 g | Freq: Every day | ORAL | Status: DC | PRN

## 2019-07-11 MED ORDER — METOPROLOL SUCCINATE ER 25 MG PO TB24
12.5000 mg | ORAL_TABLET | Freq: Every day | ORAL | Status: DC
  Administered 2019-07-12 – 2019-08-02 (×21): 12.5 mg via ORAL
  Filled 2019-07-11 (×22): qty 1

## 2019-07-11 MED ORDER — BISACODYL 10 MG RE SUPP: 10.0000 mg | Freq: Every day | RECTAL | Status: DC | PRN

## 2019-07-11 MED ORDER — OXYCODONE HCL 5 MG PO TABS
5.0000 mg | ORAL_TABLET | ORAL | Status: DC | PRN
  Administered 2019-07-15 – 2019-07-29 (×2): 5 mg via ORAL
  Filled 2019-07-11 (×6): qty 1

## 2019-07-11 MED ORDER — ACETAMINOPHEN 650 MG RE SUPP: 650.0000 mg | Freq: Four times a day (QID) | RECTAL | Status: DC | PRN

## 2019-07-11 MED ORDER — OXYCODONE HCL 5 MG PO TABS
5.0000 mg | ORAL_TABLET | ORAL | Status: DC | PRN
  Administered 2019-07-11 – 2019-07-22 (×22): 10 mg via ORAL
  Administered 2019-07-22: 5 mg via ORAL
  Administered 2019-07-23 – 2019-08-02 (×24): 10 mg via ORAL
  Filled 2019-07-11 (×46): qty 2

## 2019-07-11 MED ORDER — SODIUM CHLORIDE 0.9 % IV SOLN: 250.0000 mL | INTRAVENOUS | Status: DC

## 2019-07-11 MED ORDER — ATORVASTATIN CALCIUM 10 MG PO TABS: 20.0000 mg | ORAL_TABLET | Freq: Every day | ORAL | Status: DC

## 2019-07-11 NOTE — Plan of Care (Signed)
  Problem: Consults Goal: RH GENERAL PATIENT EDUCATION Description: See Patient Education module for education specifics. Outcome: Progressing   Problem: RH BOWEL ELIMINATION Goal: RH STG MANAGE BOWEL WITH ASSISTANCE Description: STG Manage Bowel with MOD IAssistance. Outcome: Progressing Goal: RH STG MANAGE BOWEL W/MEDICATION W/ASSISTANCE Description: STG Manage Bowel with Medication with MIN Assistance. Outcome: Progressing   Problem: RH BLADDER ELIMINATION Goal: RH STG MANAGE BLADDER WITH ASSISTANCE Description: STG Manage Bladder With MOD I Assistance Outcome: Progressing   Problem: RH SKIN INTEGRITY Goal: RH STG SKIN FREE OF INFECTION/BREAKDOWN Description: Skin will be free of infection/breakdown while in rehab Outcome: Progressing Goal: RH STG MAINTAIN SKIN INTEGRITY WITH ASSISTANCE Description: STG Maintain Skin Integrity With MIN Assistance. Outcome: Progressing   Problem: RH SAFETY Goal: RH STG ADHERE TO SAFETY PRECAUTIONS W/ASSISTANCE/DEVICE Description: STG Adhere to Safety Precautions With MOD I Assistance/Device. Outcome: Progressing Goal: RH STG DECREASED RISK OF FALL WITH ASSISTANCE Description: STG Decreased Risk of Fall With [place]. Outcome: Progressing   Problem: RH PAIN MANAGEMENT Goal: RH STG PAIN MANAGED AT OR BELOW PT'S PAIN GOAL Description: Pt will report less than 3 out of 10 on pain scale with PRN medications Outcome: Progressing   Problem: RH KNOWLEDGE DEFICIT GENERAL Goal: RH STG INCREASE KNOWLEDGE OF SELF CARE AFTER HOSPITALIZATION Outcome: Progressing

## 2019-07-11 NOTE — Progress Notes (Signed)
Pt arrived on floor, transferred from Rehab. Patient is A&OX4 and with only complaint of pain and muscle spasms. Lower back with grauze and no drainage post aspiration. Pt instructed to remain on bedrest for three hours.  Patient orienated to room and unit. Bedside table and personal belongings within reach of patient. Patient educated on usage of nurse call bell, placed within reach of patient. Patient instructed to call for assistance. Patient in bed. Will continue to monitor.

## 2019-07-11 NOTE — H&P (Signed)
Chief Complaint   Post op complication  HPI   HPI: Brandi Stephens is a 74 y.o. female well known to Dr Kathyrn Sheriff, s/p L2-3 decompression with extension of fusion from L2-S1 on 2/11,   Readmitted 3/9 for acute kidney injury.  As part of workup, an MRI as well as a CT scan of lumbar spine were ordered.  Dr. Kathyrn Sheriff reviewed the imaging.  While there was evidence of inflammatory change, he felt as though this was expected postoperatively.  He did not believe that the imaging represent did infection.  It was my recommended her acute kidney injury be addressed and we follow this closed on an outpatient basis. She was d/c to [facility] on 3/12. CR complicated by anemia as well the AKI. TH consulted. IV hydration, meds held with improvement.  Just prior to discharge from CIR, a repeat CT scan of the abdomen and pelvis was ordered by her treatment team to ensure there was no worsening given persistent pain. This revealed interval posterior slippage of the disc space right L2-3 with loosening of the left L2 screw.  There were also findings concerning for infection including small paraspinal fluid collections at L2-3.  She was subsequently transferred to the main hospital for further workup and treatment.  She underwent a CT guided aspiration yesterday by Interventional Radiology.  Infectious Disease was consulted and she was started on antibiotics.  Patient Active Problem List   Diagnosis Date Noted  . Muscle spasm   . Hypokalemia   . Status post lumbar spinal fusion 07/10/2019  . Postoperative pain   . Acute blood loss anemia   . Labile blood pressure   . Tobacco abuse   . Hypoalbuminemia due to protein-calorie malnutrition ([place])   . Anxiety 07/05/2019  . Depression 07/05/2019  . Chronic diastolic CHF (congestive heart failure) ([place]) 07/05/2019  . Obesity (BMI 30-39.9) 07/05/2019  . ETOH abuse 07/05/2019  . Lumbar radiculopathy 06/28/2019  . Spinal stenosis of lumbar region 06/28/2019  . Acute on  chronic anemia   . Tremors of nervous system   . AKI (acute kidney injury) ([place])   . Chronic pain syndrome   . Acute renal failure superimposed on stage 3 chronic kidney disease ([place]) 06/25/2019  . Acute osteomyelitis of lumbar spine ([place]) 06/25/2019  . Weakness of left lower extremity   . Abdominal pain 06/24/2019  . Nausea 06/24/2019  . Abnormal urine odor 06/24/2019  . History of sepsis 06/24/2019  . Recent major surgery 06/24/2019  . History of diverticulosis 06/24/2019  . Elevated PTHrP level 05/09/2019  . Paresthesia 02/12/2019  . Low back pain 02/12/2019  . Neuropathy 12/20/2018  . Lumbar spinal stenosis 06/29/2018  . Preoperative clearance 06/04/2018  . Abnormal CT of the chest 06/04/2018  . Smoker 05/17/2018  . Hypothyroidism 11/30/2017  . Thoracic aortic atherosclerosis ([place]) 09/14/2016  . Benign essential tremor 09/09/2016  . RLS (restless legs syndrome) 09/09/2016  . H/O adenomatous polyp of colon 07/06/2016  . PVC's (premature ventricular contractions) 03/29/2016  . Preoperative cardiovascular examination 03/29/2016  . Grade I diastolic dysfunction 0000000  . Palpitations 01/11/2016  . Shortness of breath 01/11/2016  . Former cigarette smoker 01/11/2016  . Leg edema 01/11/2016  . Chronic cough 10/28/2015  . 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 Malignant neoplasm of upper-inner quadrant of left breast in female, estrogen receptor positive ([place]) 07/14/2015  . Prediabetes 07/08/2015  . CKD (chronic kidney disease), stage III 07/08/2015  . Dependent edema 06/08/2015  . Sleep disturbance 06/08/2015  .  Mixed hyperlipidemia 06/08/2015  . Generalized anxiety disorder 06/08/2015  . Essential hypertension 06/08/2015  . COPD (chronic obstructive pulmonary disease) ([place]) 06/08/2015  . Gout 06/08/2015  . Macrocytic anemia   . 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 Sepsis ([place]) 05/15/2015  . Lumbar spondylosis 03/20/2015    PMH: Past Medical History:    Diagnosis Date  . AIN (anal intraepithelial neoplasia) anal canal   . Anemia    with pregnancy  . Anxiety   . Arthritis    knees, lower back, knees  . Chronic constipation   . Chronic diastolic CHF (congestive heart failure) ([place]) 07/05/2019  . CKD (chronic kidney disease)   . Coarse tremors    Essential  . COPD (chronic obstructive pulmonary disease) ([place])    2017 chest CT, history of  . Degenerative lumbar spinal stenosis   . Depression   . Depression 07/05/2019  . Drug dependency ([place])   . Elevated PTHrP level 05/09/2019  . ETOH abuse 07/05/2019  . GERD (gastroesophageal reflux disease)    pt denies  . Gout   . Grade I diastolic dysfunction 0000000   Noted ECHO  . Hemorrhoids   . History of adenomatous polyp of colon   . History of basal cell carcinoma (BCC) excision 2015   nose  . History of sepsis 05/14/2014   post lumbar surgery  . History of shingles    x2  . Hyperlipidemia   . Hypertension   . Hypothyroidism   . Irregular heart beat    history of while undergoing radiation  . Malignant neoplasm of upper-inner quadrant of left breast in female, estrogen receptor positive [facility]) oncologist-  dr Jana Hakim--  per lov in epic no recurrence   dx 07-13-2015  Left breast invasive ductal carcinoma, Stage IA, Grade 1 (TXN0),  09-16-2015  s/p  left breast lumpectomy with sln bx's,  completed radiation 11-18-2015,  started antiestrogen therapy 12-14-2015  . Neuropathy    Left foot and back of left leg  . Obesity (BMI 30-39.9) 07/05/2019  . Personal history of radiation therapy    completed 11-18-2015  left breast  . Pneumonia   . PONV (postoperative nausea and vomiting)   . Prediabetes    diet controlled, no med  . Restless leg syndrome   . Seasonal allergies   . Seizure ([place]) 05/2015   due to sepsis, only one time episode  . Tremor   . Wears partial dentures    lower     PSH: Past Surgical History:  Procedure Laterality Date  . ABDOMINAL HYSTERECTOMY  1985  .  BASAL CELL CARCINOMA EXCISION  10/15   nose  . BREAST BIOPSY Right 08/24/2015  . BREAST BIOPSY Right 07/13/2015  . BREAST BIOPSY Left 07/13/2015  . BREAST EXCISIONAL BIOPSY Left 1972  . BREAST LUMPECTOMY Left   . BREAST LUMPECTOMY WITH RADIOACTIVE SEED AND SENTINEL LYMPH NODE BIOPSY Left 09/16/2015   Procedure: BREAST LUMPECTOMY WITH RADIOACTIVE SEED AND SENTINEL LYMPH NODE BIOPSY;  Surgeon: Stark Klein, MD;  Location: [facility];  Service: General;  Laterality: Left;  . BREAST REDUCTION SURGERY Bilateral 1998  . CATARACT EXTRACTION W/ INTRAOCULAR LENS IMPLANT Left 2016  . [place]   hallo  . COLONOSCOPY  01/2013   polyps  . EYE SURGERY Left 2016  . HAND SURGERY Left 02-19-2002   dr Fredna Dow  @[facility]    repair collateral ligament/ MPJ left thumb  . [place]  2008  . HIGH RESOLUTION ANOSCOPY  N/A 02/28/2018   Procedure: HIGH RESOLUTION ANOSCOPY WITH BIOPSY;  Surgeon: Leighton Ruff, MD;  Location: [facility];  Service: General;  Laterality: N/A;  . KNEE ARTHROSCOPY Right 2002  . LUMBAR SPINE SURGERY  03-20-2015   dr Kathyrn Sheriff   fusion L4-5, L5-S1  . MOUTH SURGERY  07/14/2018   2 infected teeth removed  . MULTIPLE TOOTH EXTRACTIONS     with bone grafting lower bottom right and left  . REDUCTION MAMMAPLASTY Bilateral   . RIGHT COLECTOMY  09-10-2003   dr Margot Chimes @[facility]    multiple colon polyps (per path tubular adenoma's, hyperplastic , benign appendix, benign two lymph nodes  . ROTATOR CUFF REPAIR Left 04/2016  . TONSILLECTOMY  1969  . TUBAL LIGATION Bilateral 1982    Medications Prior to Admission  Medication Sig Dispense Refill Last Dose  . acetaminophen (TYLENOL) 325 MG tablet Take 2 tablets (650 mg total) by mouth every 6 (six) hours as needed for moderate pain. 20 tablet 0 Unknown at Unknown time  . anastrozole (ARIMIDEX) 1 MG tablet TAKE 1 TABLET(1 MG) BY MOUTH DAILY (Patient taking differently: Take 1 mg by mouth daily. ) 90  tablet 0 Unknown at Unknown time  . Biotin (BIOTIN 5000) 5 MG CAPS Take 5 mg by mouth daily.    Unknown at Unknown time  . Calcium Carb-Cholecalciferol (CALCIUM 1000 + D PO) Take 1 tablet by mouth daily.   Unknown at Unknown time  . colchicine 0.6 MG tablet Take 2 tablets now and 1 tablet an hour later for flare up (Patient taking differently: Take 0.6-1.2 mg by mouth daily as needed (gout). ) 90 tablet 0 Unknown at Unknown time  . diclofenac Sodium (VOLTAREN) 1 % GEL Apply 4 g topically 4 (four) times daily. As needed for knee pain 150 g 1 Unknown at Unknown time  . diphenhydrAMINE (BENADRYL) 25 MG tablet Take 25 mg by mouth daily as needed for itching.   Unknown at Unknown time  . furosemide (LASIX) 40 MG tablet Take 1 tablet (40 mg total) by mouth daily. 30 tablet 0 Unknown at Unknown time  . hydrOXYzine (VISTARIL) 25 MG capsule Take 25 mg by mouth 2 (two) times daily as needed for anxiety.   Unknown at Unknown time  . Krill Oil 350 MG CAPS Take 700 mg by mouth daily.   Unknown at Unknown time  . Magnesium 500 MG CAPS Take 500 mg by mouth daily.   Unknown at Unknown time  . Melatonin 5 MG/15ML LIQD Take 10 mg by mouth at bedtime.    Unknown at Unknown time  . methocarbamol (ROBAXIN-750) 750 MG tablet Take 1 tablet (750 mg total) by mouth 3 (three) times daily as needed for muscle spasms. 90 tablet 1 Unknown at Unknown time  . methylPREDNISolone (MEDROL) 2 MG tablet Take two tablets 3 times daily for three days, then take two tablets 2 times daily for three days, then take two tablets daily for three days, then take one tablet daily for 3 days, then stop. 39 tablet 0 Unknown at Unknown time  . ondansetron (ZOFRAN ODT) 4 MG disintegrating tablet Take 1 tablet (4 mg total) by mouth every 8 (eight) hours as needed for nausea or vomiting. 20 tablet 0 Unknown at Unknown time  . polyethylene glycol powder (MIRALAX) powder Take 17 g by mouth daily as needed for moderate constipation.    Unknown at Unknown  time  . rOPINIRole (REQUIP) 0.5 MG tablet Take 1.5 tablets (0.75 mg total)  by mouth at bedtime. 140 tablet 1 Unknown at Unknown time  . sodium zirconium cyclosilicate (LOKELMA) 5 g packet Take 5 g by mouth 3 (three) times daily. 3 packet 0 Unknown at Unknown time  . [DISCONTINUED] albuterol (PROVENTIL HFA;VENTOLIN HFA) 108 (90 Base) MCG/ACT inhaler INHALE 2 PUFFS BY MOUTH EVERY 4 TO 6 HOURS AS NEEDED (Patient taking differently: Inhale 2 puffs into the lungs every 4 (four) hours as needed for wheezing or shortness of breath. ) 42.5 g 0 Unknown at Unknown time  . [DISCONTINUED] atorvastatin (LIPITOR) 20 MG tablet TAKE 1 TABLET BY MOUTH EVERY DAY (Patient taking differently: Take 20 mg by mouth daily. ) 90 tablet 1 Unknown at Unknown time  . [DISCONTINUED] levothyroxine (SYNTHROID) 75 MCG tablet Take 1 tablet (75 mcg total) by mouth daily before breakfast. 90 tablet 1 Unknown at Unknown time  . [DISCONTINUED] metoprolol succinate (TOPROL-XL) 25 MG 24 hr tablet TAKE 1/2 TABLET(12.5 MG) BY MOUTH DAILY (Patient taking differently: Take 12.5 mg by mouth daily. ) 45 tablet 0 Unknown at Unknown time  . [DISCONTINUED] morphine (MS CONTIN) 15 MG 12 hr tablet Take 1 tablet (15 mg total) by mouth every 12 (twelve) hours. 10 tablet 0 Unknown at Unknown time  . [DISCONTINUED] oxyCODONE (OXY IR/ROXICODONE) 5 MG immediate release tablet Take 1 tablet (5 mg total) by mouth every 4 (four) hours as needed for severe pain. 10 tablet 0 Unknown at Unknown time  . [DISCONTINUED] primidone (MYSOLINE) 50 MG tablet TAKE 1 TABLET(50 MG) BY MOUTH AT BEDTIME (Patient taking differently: Take 50 mg by mouth at bedtime. ) 90 tablet 1 Unknown at Unknown time  . [DISCONTINUED] vitamin B-12 (CYANOCOBALAMIN) 1000 MCG tablet Take 1,000 mcg by mouth daily.   Unknown at Unknown time    SH: Social History   Tobacco Use  . Smoking status: Former Smoker    Packs/day: 0.50    Years: 35.00    Pack years: 17.50    Types: Cigarettes     Quit date: 05/22/2018    Years since quitting: 1.1  . Smokeless tobacco: Never Used  Substance Use Topics  . Alcohol use: Yes    Comment: 1 glass of wine a night  . Drug use: Never    Comment: CBD tincture    MEDS: Prior to Admission medications   Medication Sig Start Date End Date Taking? Authorizing Provider  acetaminophen (TYLENOL) 325 MG tablet Take 2 tablets (650 mg total) by mouth every 6 (six) hours as needed for moderate pain. 06/28/19   Arrien, Jimmy Picket, MD  albuterol (VENTOLIN HFA) 108 (90 Base) MCG/ACT inhaler INHALE 2 PUFFS BY MOUTH EVERY 4 TO 6 HOURS AS NEEDED 07/09/19   Angiulli, Lavon Paganini, PA-C  allopurinol (ZYLOPRIM) 300 MG tablet TAKE 1 TABLET(300 MG) BY MOUTH DAILY 07/09/19   Angiulli, Lavon Paganini, PA-C  ALPRAZolam Duanne Moron) 0.25 MG tablet Take 1 tablet (0.25 mg total) by mouth 3 (three) times daily as needed for anxiety. 07/09/19   Angiulli, Lavon Paganini, PA-C  anastrozole (ARIMIDEX) 1 MG tablet TAKE 1 TABLET(1 MG) BY MOUTH DAILY Patient taking differently: Take 1 mg by mouth daily.  04/21/19   Magrinat, Virgie Dad, MD  atorvastatin (LIPITOR) 20 MG tablet Take 1 tablet (20 mg total) by mouth daily. 07/09/19   Angiulli, Lavon Paganini, PA-C  Biotin (BIOTIN 5000) 5 MG CAPS Take 5 mg by mouth daily.     [provider]  Calcium Carb-Cholecalciferol (CALCIUM 1000 + D PO) Take 1 tablet by mouth  daily.    [provider]  colchicine 0.6 MG tablet Take 2 tablets now and 1 tablet an hour later for flare up Patient taking differently: Take 0.6-1.2 mg by mouth daily as needed (gout).  12/20/18   Henson, Vickie L, NP-C  diclofenac Sodium (VOLTAREN) 1 % GEL Apply 4 g topically 4 (four) times daily. As needed for knee pain 03/18/19   Henson, Vickie L, NP-C  diclofenac Sodium (VOLTAREN) 1 % GEL Apply 4 g topically 4 (four) times daily. 07/09/19   Angiulli, Lavon Paganini, PA-C  diphenhydrAMINE (BENADRYL) 25 MG tablet Take 25 mg by mouth daily as needed for itching.    [provider]    docusate sodium (COLACE) 100 MG capsule Take 1 capsule (100 mg total) by mouth 3 (three) times daily. 07/09/19   Angiulli, Lavon Paganini, PA-C  DULoxetine (CYMBALTA) 60 MG capsule TAKE 1 CAPSULE(60 MG) BY MOUTH DAILY 07/09/19   Angiulli, Lavon Paganini, PA-C  furosemide (LASIX) 40 MG tablet Take 1 tablet (40 mg total) by mouth daily. 06/28/19 07/28/19  Arrien, Jimmy Picket, MD  hydrOXYzine (VISTARIL) 25 MG capsule Take 25 mg by mouth 2 (two) times daily as needed for anxiety.    [provider]  Javier Docker Oil 350 MG CAPS Take 700 mg by mouth daily.    [provider]  levothyroxine (SYNTHROID) 75 MCG tablet Take 1 tablet (75 mcg total) by mouth daily before breakfast. 07/09/19   Angiulli, Lavon Paganini, PA-C  Magnesium 500 MG CAPS Take 500 mg by mouth daily.    [provider]  magnesium gluconate (MAGONATE) 500 MG tablet Take 2 tablets (1,000 mg total) by mouth at bedtime. 07/09/19   Angiulli, Lavon Paganini, PA-C  melatonin 300 MCG TABS Take 35 tablets (10,500 mcg total) by mouth at bedtime. 07/09/19   Angiulli, Lavon Paganini, PA-C  Melatonin 5 MG/15ML LIQD Take 10 mg by mouth at bedtime.     [provider]  metaxalone (SKELAXIN) 800 MG tablet Take 1 tablet (800 mg total) by mouth 3 (three) times daily. 07/09/19   Angiulli, Lavon Paganini, PA-C  methocarbamol (ROBAXIN) 500 MG tablet Take 2 tablets (1,000 mg total) by mouth 3 (three) times daily. 07/09/19   Angiulli, Lavon Paganini, PA-C  methocarbamol (ROBAXIN-750) 750 MG tablet Take 1 tablet (750 mg total) by mouth 3 (three) times daily as needed for muscle spasms. 05/31/19   Zenobia Kuennen, Vista Mink, PA-C  methylPREDNISolone (MEDROL) 2 MG tablet Take two tablets 3 times daily for three days, then take two tablets 2 times daily for three days, then take two tablets daily for three days, then take one tablet daily for 3 days, then stop. 06/28/19   Arrien, Jimmy Picket, MD  metoprolol succinate (TOPROL-XL) 25 MG 24 hr tablet Take 0.5 tablets (12.5 mg total) by  mouth daily. 07/09/19   Angiulli, Lavon Paganini, PA-C  morphine (MS CONTIN) 15 MG 12 hr tablet Take 1 tablet (15 mg total) by mouth every 12 (twelve) hours. 07/09/19   Angiulli, Lavon Paganini, PA-C  ondansetron (ZOFRAN ODT) 4 MG disintegrating tablet Take 1 tablet (4 mg total) by mouth every 8 (eight) hours as needed for nausea or vomiting. 06/24/19   Tysinger, Camelia Eng, PA-C  oxyCODONE (OXY IR/ROXICODONE) 5 MG immediate release tablet Take 1 tablet (5 mg total) by mouth every 4 (four) hours as needed for severe pain. 07/09/19   Angiulli, Lavon Paganini, PA-C  polyethylene glycol powder (MIRALAX) powder Take 17 g by mouth daily as needed for  moderate constipation.     [provider]  potassium chloride SA (KLOR-CON) 20 MEQ tablet Take 1 tablet (20 mEq total) by mouth daily. 07/12/19   Angiulli, Lavon Paganini, PA-C  primidone (MYSOLINE) 50 MG tablet TAKE 1 TABLET(50 MG) BY MOUTH AT BEDTIME 07/09/19   Angiulli, Lavon Paganini, PA-C  QUEtiapine (SEROQUEL) 25 MG tablet Take 1 tablet (25 mg total) by mouth at bedtime. 07/09/19   Angiulli, Lavon Paganini, PA-C  rOPINIRole (REQUIP) 0.25 MG tablet Take 3 tablets (0.75 mg total) by mouth at bedtime. 07/09/19   Angiulli, Lavon Paganini, PA-C  rOPINIRole (REQUIP) 0.5 MG tablet Take 1.5 tablets (0.75 mg total) by mouth at bedtime. 06/12/19   Henson, Vickie L, NP-C  sodium zirconium cyclosilicate (LOKELMA) 5 g packet Take 5 g by mouth 3 (three) times daily. 06/28/19   Arrien, Jimmy Picket, MD  vitamin B-12 (CYANOCOBALAMIN) 1000 MCG tablet Take 1 tablet (1,000 mcg total) by mouth daily. 07/09/19   Angiulli, Lavon Paganini, PA-C    ALLERGY: Allergies  Allergen Reactions  . Ampicillin Hives and Other (See Comments)    Severe reaction in February 2017 1.5 month to have hives to go away  . Penicillins Hives and Other (See Comments)    Severe reaction in February 2017 1.5 month to have hives to go away Has patient had a PCN reaction causing immediate rash, facial/tongue/throat swelling, SOB or  lightheadedness with hypotension: #  #  #  YES  #  #  #  Has patient had a PCN reaction causing severe rash involving mucus membranes or skin necrosis: No Has patient had a PCN reaction that required hospitalization No Has patient had a PCN reaction occurring within the last 10 years: No   . Gabapentin Swelling    UNSPECIFIED REACTION   . Other Itching    UNSPECIFIED Analgesics  . Codeine Nausea And Vomiting  . Hydrocodone-Acetaminophen Itching  . Hydromorphone Itching    Pt has taken w/o issues    Social History   Tobacco Use  . Smoking status: Former Smoker    Packs/day: 0.50    Years: 35.00    Pack years: 17.50    Types: Cigarettes    Quit date: 05/22/2018    Years since quitting: 1.1  . Smokeless tobacco: Never Used  Substance Use Topics  . Alcohol use: Yes    Comment: 1 glass of wine a night     Family History  Problem Relation Age of Onset  . Atrial fibrillation Mother   . Ulcers Father   . Breast cancer Maternal Aunt   . Lung cancer Maternal Uncle   . Stomach cancer Maternal Grandmother   . Lung cancer Maternal Aunt   . Brain cancer Maternal Aunt   . Prostate cancer Maternal Grandfather   . Stroke Paternal Grandmother   . Tuberculosis Paternal Grandfather   . Colon cancer Neg Hx   . Esophageal cancer Neg Hx   . Rectal cancer Neg Hx      ROS   Review of Systems  Constitutional: Negative.   HENT: Negative.   Eyes: Negative.   Respiratory: Negative.   Cardiovascular: Negative.   Gastrointestinal: Positive for nausea. Negative for vomiting.  Genitourinary: Negative.   Musculoskeletal: Positive for back pain and myalgias.  Skin: Negative.   Neurological: Positive for weakness (generalized). Negative for dizziness, tingling, tremors, sensory change, speech change, focal weakness and headaches.    Exam   Vitals:   07/11/19 0442 07/11/19 0922  BP: (!) 146/77  137/84  Pulse: 85 86  Resp: 18   Temp: 98.5 F (36.9 C)   SpO2: 96%    General  appearance: WDWN, NAD Eyes: No scleral injection Cardiovascular: Regular rate and rhythm without murmurs, rubs, gallops. No edema or variciosities. Distal pulses normal. Pulmonary: Effort normal, non-labored breathing Musculoskeletal:     Muscle tone upper extremities: Normal    Muscle tone lower extremities: Normal    Motor exam: Upper Extremities Deltoid Bicep Tricep Grip  Right 5/5 5/5 5/5 5/5  Left 5/5 5/5 5/5 5/5   Lower Extremity IP Quad PF DF EHL  Right 5/5 5/5 5/5 5/5 5/5  Left 5/5 5/5 5/5 5/5 5/5   Neurological Mental Status:    - Patient is awake, alert, oriented to person, place, month, year, and situation    - Patient is able to give a clear and coherent history.    - No signs of aphasia or neglect Cranial Nerves    - II: Visual Fields are full. PERRL    - III/IV/VI: EOMI without ptosis or diploplia.     - V: Facial sensation is grossly normal    - VII: Facial movement is symmetric.     - VIII: hearing is intact to voice    - X: Uvula elevates symmetrically    - XI: Shoulder shrug is symmetric.    - XII: tongue is midline without atrophy or fasciculations.  Sensory: Sensation grossly intact to LT  Results - Imaging/Labs   Results for orders placed or performed during the hospital encounter of 06/28/19 (from the past 48 hour(s))  CBC     Status: Abnormal   Collection Time: 07/10/19  5:12 AM  Result Value Ref Range   WBC 9.3 4.0 - 10.5 K/uL   RBC 2.93 (L) 3.87 - 5.11 MIL/uL   Hemoglobin 8.9 (L) 12.0 - 15.0 g/dL   HCT 28.8 (L) 36.0 - 46.0 %   MCV 98.3 80.0 - 100.0 fL   MCH 30.4 26.0 - 34.0 pg   MCHC 30.9 30.0 - 36.0 g/dL   RDW 14.5 11.5 - 15.5 %   Platelets 325 150 - 400 K/uL   nRBC 0.0 0.0 - 0.2 %    Comment: Performed at [facility], [place] [address], [place], [place] [address]  Comprehensive metabolic panel     Status: Abnormal   Collection Time: 07/10/19  5:12 AM  Result Value Ref Range   Sodium 136 135 - 145 mmol/L   Potassium 3.3 (L) 3.5 - 5.1  mmol/L   Chloride 101 98 - 111 mmol/L   CO2 26 22 - 32 mmol/L   Glucose, Bld 146 (H) 70 - 99 mg/dL    Comment: Glucose reference range applies only to samples taken after fasting for at least 8 hours.   BUN <5 (L) 8 - 23 mg/dL   Creatinine, Ser 0.73 0.44 - 1.00 mg/dL   Calcium 9.0 8.9 - 10.3 mg/dL   Total Protein 5.9 (L) 6.5 - 8.1 g/dL   Albumin 2.4 (L) 3.5 - 5.0 g/dL   AST 23 15 - 41 U/L   ALT 29 0 - 44 U/L   Alkaline Phosphatase 89 38 - 126 U/L   Total Bilirubin 0.4 0.3 - 1.2 mg/dL   GFR calc non Af Amer >60 >60 mL/min   GFR calc Af Amer >60 >60 mL/min   Anion gap 9 5 - 15    Comment: Performed at [facility], [place] [address],  [place], [place] [address]  Phosphorus     Status: None   Collection Time: 07/10/19  5:12 AM  Result Value Ref Range   Phosphorus 3.3 2.5 - 4.6 mg/dL    Comment: Performed at [facility] [address] [place], [place] [address]  Phosphorus     Status: None   Collection Time: 07/11/19  5:37 AM  Result Value Ref Range   Phosphorus 3.5 2.5 - 4.6 mg/dL    Comment: Performed at [facility] [address], [place],  [address]    CT ABDOMEN PELVIS W CONTRAST  Result Date: 07/09/2019 CLINICAL DATA:  74 year old female with left lower quadrant abdominal pain. EXAM: CT ABDOMEN AND PELVIS WITH CONTRAST TECHNIQUE: Multidetector CT imaging of the abdomen and pelvis was performed using the standard protocol following bolus administration of intravenous contrast. CONTRAST:  114mL OMNIPAQUE IOHEXOL 300 MG/ML  SOLN COMPARISON:  CT abdomen pelvis dated 06/24/2019. FINDINGS: Lower chest: Minimal bibasilar subpleural thickening or scarring. The visualized lung bases are otherwise clear. No intra-abdominal free air or free fluid. Hepatobiliary: Apparent fatty infiltration of the liver. No intrahepatic biliary ductal dilatation. The gallbladder is unremarkable. Pancreas: Unremarkable. No pancreatic ductal dilatation or surrounding inflammatory  changes. Spleen: Normal in size without focal abnormality. Adrenals/Urinary Tract: The adrenal glands are unremarkable. There is no hydronephrosis on either side. There is symmetric enhancement and excretion of contrast by both kidneys. There is a 1 cm left renal cyst and subcentimeter hypodensities which are too small to characterize. The visualized ureters and urinary bladder appear unremarkable. Stomach/Bowel: There is sigmoid diverticulosis without active inflammatory changes. Mild thickened appearance of the distal esophagus, likely related to underdistention. Mild esophagitis is not excluded. Clinical correlation is recommended. There is no bowel obstruction. Postsurgical changes with ileocolic anastomosis the right lower quadrant. Appendectomy. Vascular/Lymphatic: Advanced aortoiliac atherosclerotic disease. The IVC is unremarkable. No portal venous gas. There is no adenopathy. Reproductive: Hysterectomy. No adnexal masses. Other: None Musculoskeletal: Multilevel degenerative changes of the spine L2-S1 disc spacer and posterior fusion. There is grade 1 L2-L3 retrolisthesis with progression of the posterior slippage of the disc spacer at L2-L3 compared to the prior CT. The L2 fusion screws breach the superior endplate of L2 similar to prior CT. There is bone lucency adjacent to the L2 screws in the posterior elements, left greater right and progressed since the prior CT. There is thickening of the prevertebral soft tissues at L2-L3 with several ill-defined small loculated fluid in the adjacent soft tissues and in the psoas musculature most consistent with small abscesses. There is irregularity of the L2 inferior endplate and L3 superior endplate. Posterior hardware is intact. No acute osseous pathology. IMPRESSION: 1. Extensive postsurgical changes of the lumbar spine with posterior fusion at L2-S1. Interval posterior slippage of the disc spacer at L2-L3 with findings of loosening of the left L2 screw. There  are findings of infection and small paraspinal abscesses at L2-L3. 2. Sigmoid diverticulosis. No bowel obstruction. 3. Aortic Atherosclerosis (ICD10-I70.0). Electronically Signed   By: Anner Crete M.D.   On: 07/09/2019 20:40    Impression/Plan   74 y.o. female 1 1/2 months status postL2-3 decompression with extension of fusion from L2-S1 by Dr Kathyrn Sheriff with slippage of the disc spacer at L2-3 with findings of loosening of the left L2 screw.  This in combination with fluid collections at the paraspinal musculatures at L2-3 are concerning for infection. -  CT guided aspiration performed by IR yesterday.  Culture still pending.  An initial Gram stain negative. -  Id consulted.  Started on vanc and cefepime  Ferne Reus, PA-C [facility] and [employer]

## 2019-07-11 NOTE — Progress Notes (Signed)
Brandi Stephens for Infectious Disease  Date of Admission:  06/28/2019     ASSESSMENT: She has postoperative lumbar infection. She will undergo lumbar aspiration this afternoon.  I will start her on IV vancomycin and cefepime after the procedure while awaiting final culture results.  I recommend transferring her back to the acute medical side for continued treatment.  PLAN: 1. Start IV vancomycin and cefepime after lumbar aspiration today  Principal Problem:   Acute osteomyelitis of lumbar spine ([place]) Active Problems:   Status post lumbar spinal fusion   Mixed hyperlipidemia   Essential hypertension   COPD (chronic obstructive pulmonary disease) (HCC)   Malignant neoplasm of upper-inner quadrant of left breast in female, estrogen receptor positive (HCC)   PVC's (premature ventricular contractions)   RLS (restless legs syndrome)   Hypothyroidism   Lumbar spinal stenosis   Weakness of left lower extremity   Acute on chronic anemia   AKI (acute kidney injury) ([place])   Spinal stenosis of lumbar region   Anxiety   Depression   Chronic diastolic CHF (congestive heart failure) (HCC)   Obesity (BMI 30-39.9)   ETOH abuse   Labile blood pressure   Tobacco abuse   Hypoalbuminemia due to protein-calorie malnutrition (HCC)   Postoperative pain   Acute blood loss anemia   Muscle spasm   Hypokalemia   Scheduled Meds: . allopurinol  300 mg Oral Daily  . alum & mag hydroxide-simeth  15 mL Oral TID  . anastrozole  1 mg Oral Daily  . atorvastatin  20 mg Oral q1800  . diclofenac Sodium  4 g Topical QID  . docusate sodium  100 mg Oral TID  . DULoxetine  60 mg Oral Daily  . feeding supplement (PRO-STAT SUGAR FREE 64)  30 mL Oral BID  . levothyroxine  75 mcg Oral Q0600  . magnesium gluconate  1,000 mg Oral QHS  . melatonin  10.5 mg Oral QHS  . metaxalone  800 mg Oral TID  . methocarbamol  1,000 mg Oral TID  . metoprolol succinate  12.5 mg Oral Daily  . morphine  15 mg Oral  Q12H  . nystatin  5 mL Oral QID  . polyethylene glycol  17 g Oral BID  . potassium chloride  20 mEq Oral Daily  . primidone  50 mg Oral QHS  . QUEtiapine  25 mg Oral QHS  . rOPINIRole  0.75 mg Oral QHS  . senna  2 tablet Oral QHS  . sodium chloride flush  10-40 mL Intracatheter Q12H  . vitamin B-12  1,000 mcg Oral Daily   Continuous Infusions: . sodium chloride 100 mL/hr at 07/09/19 0205   PRN Meds:.acetaminophen, albuterol, ALPRAZolam, ondansetron **OR** ondansetron (ZOFRAN) IV, oxyCODONE, sodium chloride flush, sorbitol   SUBJECTIVE: She continues to have severe, debilitating pain and muscle spasms.  She is unable to participate in physical therapy.  I do not feel that she can safely be discharged home to care for herself.    Review of Systems: Review of Systems  Constitutional: Negative for fever.  Gastrointestinal: Positive for abdominal pain.  Musculoskeletal: Positive for back pain.    Allergies  Allergen Reactions  . Ampicillin Hives and Other (See Comments)    Severe reaction in February 2017 1.5 month to have hives to go away  . Penicillins Hives and Other (See Comments)    Severe reaction in February 2017 1.5 month to have hives to go away Has patient had a  PCN reaction causing immediate rash, facial/tongue/throat swelling, SOB or lightheadedness with hypotension: #  #  #  YES  #  #  #  Has patient had a PCN reaction causing severe rash involving mucus membranes or skin necrosis: No Has patient had a PCN reaction that required hospitalization No Has patient had a PCN reaction occurring within the last 10 years: No   . Gabapentin Swelling    UNSPECIFIED REACTION   . Other Itching    UNSPECIFIED Analgesics  . Codeine Nausea And Vomiting  . Hydrocodone-Acetaminophen Itching  . Hydromorphone Itching    Pt has taken w/o issues    OBJECTIVE: Vitals:   07/10/19 1450 07/10/19 2011 07/11/19 0442 07/11/19 0922  BP: 130/74 (!) 159/110 (!) 146/77 137/84  Pulse: 83  (!) 107 85 86  Resp: 18 19 18    Temp: 99 F (37.2 C) 99.5 F (37.5 C) 98.5 F (36.9 C)   TempSrc: Oral Oral Oral   SpO2: 95% 97% 96%   Weight:      Height:       Body mass index is 38.21 kg/m.  Physical Exam Constitutional:      Comments: She is resting quietly in bed clutching a pillow and heating pad to her stomach.  Her daughter is at the bedside.     Lab Results Lab Results  Component Value Date   WBC 9.3 07/10/2019   HGB 8.9 (L) 07/10/2019   HCT 28.8 (L) 07/10/2019   MCV 98.3 07/10/2019   PLT 325 07/10/2019    Lab Results  Component Value Date   CREATININE 0.73 07/10/2019   BUN <5 (L) 07/10/2019   NA 136 07/10/2019   K 3.3 (L) 07/10/2019   CL 101 07/10/2019   CO2 26 07/10/2019    Lab Results  Component Value Date   ALT 29 07/10/2019   AST 23 07/10/2019   ALKPHOS 89 07/10/2019   BILITOT 0.4 07/10/2019     Microbiology: Recent Results (from the past 240 hour(s))  Culture, Urine     Status: Abnormal   Collection Time: 07/04/19  9:27 AM   Specimen: Urine, Random  Result Value Ref Range Status   Specimen Description URINE, RANDOM  Final   Special Requests   Final    NONE Performed at [facility], [place] [address] [place], [place] [address]    Culture MULTIPLE SPECIES PRESENT, SUGGEST RECOLLECTION (A)  Final   Report Status 07/05/2019 FINAL  Final  Culture, blood (routine x 2)     Status: None   Collection Time: 07/04/19  1:53 PM   Specimen: BLOOD  Result Value Ref Range Status   Specimen Description BLOOD RIGHT ANTECUBITAL  Final   Special Requests   Final    BOTTLES DRAWN AEROBIC AND ANAEROBIC Blood Culture adequate volume Performed at [facility], [place] [address] [place], [place] [address]    Culture NO GROWTH 5 DAYS  Final   Report Status 07/09/2019 FINAL  Final  Culture, blood (routine x 2)     Status: None   Collection Time: 07/04/19  1:53 PM   Specimen: BLOOD  Result Value Ref Range Status   Specimen Description BLOOD RIGHT  ANTECUBITAL  Final   Special Requests   Final    BOTTLES DRAWN AEROBIC AND ANAEROBIC Blood Culture adequate volume Performed at [facility], [place] [address] [place], [place] [address]    Culture NO GROWTH 5 DAYS  Final   Report Status 07/09/2019  FINAL  Final  Culture, Urine     Status: Abnormal   Collection Time: 07/05/19  8:31 PM   Specimen: Urine, Random  Result Value Ref Range Status   Specimen Description URINE, RANDOM  Final   Special Requests   Final    NONE Performed at [facility], [place] [address], [place], [place] [address]    Culture (A)  Final    20,000 COLONIES/mL LACTOBACILLUS SPECIES Standardized susceptibility testing for this organism is not available.    Report Status 07/07/2019 FINAL  Final    [name], MD [facility] for Infectious [facility] [phone] pager   [phone] cell 07/11/2019, 12:00 PM

## 2019-07-11 NOTE — Progress Notes (Signed)
Pharmacy Antibiotic Note  Brandi Stephens is a 74 y.o. female admitted on 06/28/2019 with increased back pain, left leg weakness, and abnormal outpatient blood work. Pharmacy has been consulted for vancomycin and cefepime dosing for postoperative lumbar infection.   Patient underwent low back decompression and fusion approximately 1 month ago. Had an additional procedure earlier this month with worsening pain since. During admission she has continued to have severe, debilitating pain and muscle spasms preventing work with PT and safe discharge home.   Given her history of prior antibiotic exposure, she is high risk for infection with drug-resistant pathogens. She also has a reported penicillin allergy, the history of which is not fully clear but does not seem high risk for reaction to cefepime. Patient underwent lumber aspiration today and will now be started on broad-spectrum IV antibiotics pending final culture results.    Plan: Start cefepime 2g IV q8h Start vancomycin 2g x1, then 1750mg  IV q24h. --Goal AUC 400-550 --Estimated AUC 533  Monitor clinical improvement, renal function, allergic reaction, cultures and susceptibilities, and vancomycin levels as clinically indicated.   Height: 5\' 8"  (172.7 cm) Weight: 251 lb 5.2 oz (114 kg) IBW/kg (Calculated) : 63.9  Temp (24hrs), Avg:98.7 F (37.1 C), Min:98.2 F (36.8 C), Max:99.5 F (37.5 C)  Recent Labs  Lab 07/06/19 0622 07/07/19 0546 07/08/19 0542 07/09/19 0548 07/10/19 0512  WBC 8.4 7.6 10.3 8.5 9.3  CREATININE 0.95 0.96 0.80 0.74 0.73    Estimated Creatinine Clearance: 84.2 mL/min (by C-G formula based on SCr of 0.73 mg/dL).    Allergies  Allergen Reactions  . Ampicillin Hives and Other (See Comments)    Severe reaction in February 2017 1.5 month to have hives to go away  . Penicillins Hives and Other (See Comments)    Severe reaction in February 2017 1.5 month to have hives to go away Has patient had a PCN reaction  causing immediate rash, facial/tongue/throat swelling, SOB or lightheadedness with hypotension: #  #  #  YES  #  #  #  Has patient had a PCN reaction causing severe rash involving mucus membranes or skin necrosis: No Has patient had a PCN reaction that required hospitalization No Has patient had a PCN reaction occurring within the last 10 years: No   . Gabapentin Swelling    UNSPECIFIED REACTION   . Other Itching    UNSPECIFIED Analgesics  . Codeine Nausea And Vomiting  . Hydrocodone-Acetaminophen Itching  . Hydromorphone Itching    Pt has taken w/o issues    Antimicrobials this admission: Vancomycin 3/25 >> Cefepime 3/25 >>  Dose adjustments this admission: N/A  Microbiology results: -3/18 UCx: multiple species > sent for recollection > lactobacillus species -3/18 BCx: negative -3/25 Abscess aspirate: pending   Thank you for allowing pharmacy to be a part of this patient's care.  Claudina Lick, PharmD Candidate 07/11/2019 3:50 PM

## 2019-07-11 NOTE — Procedures (Signed)
  Procedure: CT aspiration L paraspinal abscess <90ml viscous serosanguinous, sent for GS, C&S EBL:   minimal Complications:  none immediate  See full dictation in [employer].  Dillard Cannon MD Main # [phone] Pager  [phone]

## 2019-07-11 NOTE — Progress Notes (Signed)
[place] PHYSICAL MEDICINE & REHABILITATION PROGRESS NOTE   Subjective/Complaints: Patient seen laying in bed this morning.  She states she slept well overnight due to spasms.  Discussed with VIR yesterday, plans for aspiration today.  ROS: + Left lower quadrant pain, stable.  Denies CP, SOB, N/V/D  Objective:   CT ABDOMEN PELVIS W CONTRAST  Result Date: 07/09/2019 CLINICAL DATA:  74 year old female with left lower quadrant abdominal pain. EXAM: CT ABDOMEN AND PELVIS WITH CONTRAST TECHNIQUE: Multidetector CT imaging of the abdomen and pelvis was performed using the standard protocol following bolus administration of intravenous contrast. CONTRAST:  133mL OMNIPAQUE IOHEXOL 300 MG/ML  SOLN COMPARISON:  CT abdomen pelvis dated 06/24/2019. FINDINGS: Lower chest: Minimal bibasilar subpleural thickening or scarring. The visualized lung bases are otherwise clear. No intra-abdominal free air or free fluid. Hepatobiliary: Apparent fatty infiltration of the liver. No intrahepatic biliary ductal dilatation. The gallbladder is unremarkable. Pancreas: Unremarkable. No pancreatic ductal dilatation or surrounding inflammatory changes. Spleen: Normal in size without focal abnormality. Adrenals/Urinary Tract: The adrenal glands are unremarkable. There is no hydronephrosis on either side. There is symmetric enhancement and excretion of contrast by both kidneys. There is a 1 cm left renal cyst and subcentimeter hypodensities which are too small to characterize. The visualized ureters and urinary bladder appear unremarkable. Stomach/Bowel: There is sigmoid diverticulosis without active inflammatory changes. Mild thickened appearance of the distal esophagus, likely related to underdistention. Mild esophagitis is not excluded. Clinical correlation is recommended. There is no bowel obstruction. Postsurgical changes with ileocolic anastomosis the right lower quadrant. Appendectomy. Vascular/Lymphatic: Advanced aortoiliac  atherosclerotic disease. The IVC is unremarkable. No portal venous gas. There is no adenopathy. Reproductive: Hysterectomy. No adnexal masses. Other: None Musculoskeletal: Multilevel degenerative changes of the spine L2-S1 disc spacer and posterior fusion. There is grade 1 L2-L3 retrolisthesis with progression of the posterior slippage of the disc spacer at L2-L3 compared to the prior CT. The L2 fusion screws breach the superior endplate of L2 similar to prior CT. There is bone lucency adjacent to the L2 screws in the posterior elements, left greater right and progressed since the prior CT. There is thickening of the prevertebral soft tissues at L2-L3 with several ill-defined small loculated fluid in the adjacent soft tissues and in the psoas musculature most consistent with small abscesses. There is irregularity of the L2 inferior endplate and L3 superior endplate. Posterior hardware is intact. No acute osseous pathology. IMPRESSION: 1. Extensive postsurgical changes of the lumbar spine with posterior fusion at L2-S1. Interval posterior slippage of the disc spacer at L2-L3 with findings of loosening of the left L2 screw. There are findings of infection and small paraspinal abscesses at L2-L3. 2. Sigmoid diverticulosis. No bowel obstruction. 3. Aortic Atherosclerosis (ICD10-I70.0). Electronically Signed   By: Anner Crete M.D.   On: 07/09/2019 20:40   Recent Labs    07/09/19 0548 07/10/19 0512  WBC 8.5 9.3  HGB 8.5* 8.9*  HCT 27.4* 28.8*  PLT 317 325   Recent Labs    07/09/19 0548 07/10/19 0512  NA 139 136  K 3.7 3.3*  CL 109 101  CO2 23 26  GLUCOSE 146* 146*  BUN 7* <5*  CREATININE 0.74 0.73  CALCIUM 8.8* 9.0    Intake/Output Summary (Last 24 hours) at 07/11/2019 [id number] Last data filed at 07/10/2019 1507 Gross per 24 hour  Intake 0 ml  Output --  Net 0 ml     Physical Exam: Vital Signs Blood pressure (!) 146/77, pulse 85, temperature  98.5 F (36.9 C), temperature source Oral,  resp. rate 18, height 5\' 8"  (1.727 m), weight 114 kg, SpO2 96 %. Constitutional: No distress . Vital signs reviewed.  Obese. HENT: Normocephalic.  Atraumatic. Eyes: EOMI. No discharge. Cardiovascular: No JVD. Respiratory: Normal effort.  No stridor. GI: Non-distended. Skin: Warm and dry.  Intact. Psych: Normal mood.  Normal behavior. Musc: No edema in extremities.  No tenderness in extremities. Neuro: Alert Motor: Bilateral upper extremities: 4+/5 proximal distal Right lower extremities: 4-4+/5 proximal distal, unchanged Left lower extremity: Hip flexion, knee extension 4 -/5, distally 4+/5  Assessment/Plan: 1. Functional deficits secondary to acute on chronic back pain S/P recent L2-L3 fusion 05/30/2019. which require 3+ hours per day of interdisciplinary therapy in a comprehensive inpatient rehab setting.  Physiatrist is providing close team supervision and 24 hour management of active medical problems listed below.  Physiatrist and rehab team continue to assess barriers to discharge/monitor patient progress toward functional and medical goals  Care Tool:  Bathing    Body parts bathed by patient: Right arm, Left arm, Chest, Abdomen, Face, Front perineal area, Right upper leg, Left upper leg, Buttocks, Right lower leg, Left lower leg   Body parts bathed by helper: Right lower leg, Left lower leg Body parts n/a: Front perineal area, Buttocks, Right upper leg, Left upper leg, Right lower leg   Bathing assist Assist Level: Supervision/Verbal cueing(used long bath sponge)     Upper Body Dressing/Undressing Upper body dressing Upper body dressing/undressing activity did not occur (including orthotics): N/A What is the patient wearing?: Bra, Pull over shirt    Upper body assist Assist Level: Set up assist    Lower Body Dressing/Undressing Lower body dressing      What is the patient wearing?: Pants, Underwear/pull up     Lower body assist Assist for lower body dressing:  Minimal Assistance - Patient > 75%     Toileting Toileting    Toileting assist Assist for toileting: Minimal Assistance - Patient > 75% Assistive Device Comment: RW   Transfers Chair/bed transfer  Transfers assist     Chair/bed transfer assist level: Supervision/Verbal cueing Chair/bed transfer assistive device: Programmer, multimedia   Ambulation assist      Assist level: Supervision/Verbal cueing Assistive device: Walker-rolling Max distance: 235'   Walk 10 feet activity   Assist     Assist level: Supervision/Verbal cueing Assistive device: Walker-rolling   Walk 50 feet activity   Assist    Assist level: Supervision/Verbal cueing Assistive device: Walker-rolling    Walk 150 feet activity   Assist    Assist level: Supervision/Verbal cueing Assistive device: Walker-rolling    Walk 10 feet on uneven surface  activity   Assist     Assist level: Supervision/Verbal cueing Assistive device: Aeronautical engineer Will patient use wheelchair at discharge?: No             Wheelchair 50 feet with 2 turns activity    Assist            Wheelchair 150 feet activity     Assist          Blood pressure (!) 146/77, pulse 85, temperature 98.5 F (36.9 C), temperature source Oral, resp. rate 18, height 5\' 8"  (1.727 m), weight 114 kg, SpO2 96 %.  Medical Problem List and Plan: 1.  Decreased functional mobility secondary to spinal stenosis S/P recent L2-L3 fusion 05/30/2019.    Plan for aspiration by VIR  today.  Will discuss with ID recommendations regarding discharge 2.  Antithrombotics: -DVT/anticoagulation: SCDs.               Vascular study negative for DVT             -antiplatelet therapy: N/A 3. Pain Management: Voltaren gel 4 times daily, Cymbalta 60 mg daily, Robaxin 3 times daily, MS Contin 15 mg every 12 hours, oxycodone as needed for breakthrough pain  low scheduled dose of  tizanidine  Relatively stable with meds on 3/25 4. Mood: Provide emotional support             -antipsychotic agents: N/A  Seroquel 25mg  HS.  5. Neuropsych: This patient is capable of making decisions on her own behalf. 6. Skin/Wound Care: Routine skin checks 7. Fluids/Electrolytes/Nutrition: Routine in and outs.   8.  Acute on chronic anemia.    Hemoglobin 8.9 on 3/24  Continue to monitor 9.  AKI on?  CKD stage III: Resolved  10.  Hypertension.  Toprol-XL 12.5 mg daily restarted on 3/23.               Monitor with increased mobility  Labile on 3/25, monitor for trend 11.  Obesity.  BMI 38.67.  Dietary follow-up 12.  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  Follow-up outpatient Dr. Jana Hakim.  Continue Armidex 13.  Hyperlipidemia.  Lipitor 14.  History of gout.  Continue Zyloprim. 15.  Hypothyroidism.  TSH 1.420.  Continue Synthroid 16.  Chronic constipation.    Adjust bowel meds as necessary 17.  Emphysema/tobacco abuse.  Check oxygen saturations every shift  Continue to counsel on smoking cessation 18.  Tremors with restless leg syndrome.  Continue Requip as well as Mysoline 19. Hyperkalemia  Potassium 3.3 on 3/24, labs ordered  Supplement initiated 20. LLQ pain-   Renal US no evidence of hydronephrosis.  CT abdomen pelvis ordered on 3/23, personally reviewed, see #1 21. Fatigue/pallor: Appreciate NSGY follow-up.   Blood cultures no growth  Urine cultures with 20,000 growth of lactobacillus 22.  Radiation induced tooth decay/Thursh:   Nystatin swish and swallow ordered. Daughter will schedule outpatient dentistry follow-up.  23.  Prediabetes  Monitor with increased mobility  Elevated on 3/24 24.  Hypoalbuminemia  Supplement initiated on 3/22  LOS: 13 days A FACE TO FACE EVALUATION WAS PERFORMED  Aayansh Codispoti Lorie Phenix 07/11/2019, 8:22 AM

## 2019-07-11 NOTE — Progress Notes (Signed)
Patient is NPO except meds with sips of water per Dr. Posey Pronto.

## 2019-07-12 ENCOUNTER — Inpatient Hospital Stay: Payer: Self-pay

## 2019-07-12 DIAGNOSIS — Z881 Allergy status to other antibiotic agents status: Secondary | ICD-10-CM

## 2019-07-12 DIAGNOSIS — Z880 Allergy status to penicillin: Secondary | ICD-10-CM

## 2019-07-12 DIAGNOSIS — Z885 Allergy status to narcotic agent status: Secondary | ICD-10-CM

## 2019-07-12 DIAGNOSIS — T8140XA Infection following a procedure, unspecified, initial encounter: Secondary | ICD-10-CM

## 2019-07-12 DIAGNOSIS — Z888 Allergy status to other drugs, medicaments and biological substances status: Secondary | ICD-10-CM

## 2019-07-12 LAB — CBC
HCT: 31.2 % — ABNORMAL LOW (ref 36.0–46.0)
Hemoglobin: 9.7 g/dL — ABNORMAL LOW (ref 12.0–15.0)
MCH: 30.6 pg (ref 26.0–34.0)
MCHC: 31.1 g/dL (ref 30.0–36.0)
MCV: 98.4 fL (ref 80.0–100.0)
Platelets: 296 10*3/uL (ref 150–400)
RBC: 3.17 MIL/uL — ABNORMAL LOW (ref 3.87–5.11)
RDW: 14.4 % (ref 11.5–15.5)
WBC: 7.1 10*3/uL (ref 4.0–10.5)
nRBC: 0 % (ref 0.0–0.2)

## 2019-07-12 LAB — BASIC METABOLIC PANEL
Anion gap: 9 (ref 5–15)
BUN: 11 mg/dL (ref 8–23)
CO2: 27 mmol/L (ref 22–32)
Calcium: 9 mg/dL (ref 8.9–10.3)
Chloride: 104 mmol/L (ref 98–111)
Creatinine, Ser: 0.81 mg/dL (ref 0.44–1.00)
GFR calc Af Amer: >60 mL/min (ref >60)
GFR calc non Af Amer: >60 mL/min (ref >60)
Glucose, Bld: 112 mg/dL — ABNORMAL HIGH (ref 70–99)
Potassium: 3.9 mmol/L (ref 3.5–5.1)
Sodium: 140 mmol/L (ref 135–145)

## 2019-07-12 MED ORDER — VANCOMYCIN HCL 2000 MG/400ML IV SOLN
2000.0000 mg | Freq: Once | INTRAVENOUS | Status: AC
  Administered 2019-07-12: 2000 mg via INTRAVENOUS
  Filled 2019-07-12: qty 400

## 2019-07-12 MED ORDER — DIPHENHYDRAMINE HCL 25 MG PO CAPS
25.0000 mg | ORAL_CAPSULE | Freq: Four times a day (QID) | ORAL | Status: DC | PRN
  Administered 2019-07-12 – 2019-08-02 (×15): 25 mg via ORAL
  Filled 2019-07-12 (×16): qty 1

## 2019-07-12 MED ORDER — HYDROXYZINE HCL 25 MG PO TABS
25.0000 mg | ORAL_TABLET | Freq: Four times a day (QID) | ORAL | Status: DC | PRN
  Administered 2019-07-12 – 2019-08-02 (×4): 25 mg via ORAL
  Filled 2019-07-12 (×4): qty 1

## 2019-07-12 MED ORDER — SODIUM CHLORIDE 0.9 % IV SOLN
2.0000 g | Freq: Three times a day (TID) | INTRAVENOUS | Status: DC
  Administered 2019-07-12 – 2019-07-14 (×7): 2 g via INTRAVENOUS
  Filled 2019-07-12 (×8): qty 2

## 2019-07-12 MED ORDER — VANCOMYCIN HCL 1750 MG/350ML IV SOLN: 1750.0000 mg | INTRAVENOUS | Status: DC

## 2019-07-12 MED ORDER — VANCOMYCIN HCL 1750 MG/350ML IV SOLN
1750.0000 mg | INTRAVENOUS | Status: DC
  Administered 2019-07-13 – 2019-07-14 (×2): 1750 mg via INTRAVENOUS
  Filled 2019-07-12 (×2): qty 350

## 2019-07-12 MED ORDER — VANCOMYCIN HCL 2000 MG/400ML IV SOLN
2000.0000 mg | Freq: Once | INTRAVENOUS | Status: AC
  Filled 2019-07-12: qty 400

## 2019-07-12 MED ORDER — HYDROXYZINE HCL 50 MG/ML IM SOLN
25.0000 mg | Freq: Three times a day (TID) | INTRAMUSCULAR | Status: DC | PRN
  Filled 2019-07-12: qty 0.5

## 2019-07-12 NOTE — Progress Notes (Signed)
Patient c/o itching around IV site.  Stopped Vanc that had been running for 30 minutes and flushed line.  Localized area of redness, swelling, itching and warmth at IV site.  Ice applied and Dr. Megan Salon notified.  Per Dr. Megan Salon, hold Vanc at this time.  Dr. Megan Salon to come assess pt.  Will monitor closely.

## 2019-07-12 NOTE — Progress Notes (Signed)
PIV consult: R forearm site infiltrated. Pt reported pain with saline flush. Erythema and edema noted to site consistent with IV infiltration (8cm catheter). Pt had ice on site when I arrived. L arm is restricted due to prior surgery. Long 20g placed in R upper arm. Please consider PICC or alternative if pt will need prolonged antibiotic therapy.

## 2019-07-12 NOTE — Progress Notes (Signed)
  NEUROSURGERY PROGRESS NOTE   No issues overnight.  Complains of persistent muscle spasms No new N/T/W  EXAM:  BP (!) 135/55 (BP Location: Right Arm)   Pulse 88   Temp 98.3 F (36.8 C) (Oral)   Resp 15   SpO2 99%   Awake, alert, oriented  Speech fluent, appropriate  CN grossly intact  MAEW Incision: c/d/i, healing well. No sign of infection  IMPRESSION/PLAN 74 y.o. female s/pL2-3 decompression with extension of fusion from L2-S1 on 2/11, now with imaging concerning for infection with slippage of L2-3 cage and loosening of L2 screw. - Aspiration of paraspinal muscle collection performed yesterday by IR. Results pending.  Will await final cultures. Patient will need to have lumbar revision once cultures finalized and pending results. - Continue abx per ID. Appreciate IDs assistance.

## 2019-07-12 NOTE — Plan of Care (Signed)

## 2019-07-12 NOTE — Progress Notes (Signed)
Patient ID: Brandi Stephens, female   DOB: 1945-05-26, 74 y.o.   MRN: [id number]         [facility] for Infectious Disease  Date of Admission:  07/11/2019           Day 1 vancomycin        Day 1 cefepime ASSESSMENT: I will order PICC placement and continue empiric vancomycin and cefepime pending lumbar aspirate cultures.  PLAN: 1. PICC placement 2. Continue vancomycin and cefepime pending final culture results  Active Problems:   Post op infection   Scheduled Meds: . acetaminophen  1,000 mg Oral Q6H  . allopurinol  300 mg Oral Daily  . anastrozole  1 mg Oral Daily  . atorvastatin  20 mg Oral Daily  . docusate sodium  100 mg Oral TID  . DULoxetine  60 mg Oral Daily  . levothyroxine  75 mcg Oral QAC breakfast  . magnesium gluconate  1,000 mg Oral QHS  . metaxalone  800 mg Oral TID  . methocarbamol  1,000 mg Oral TID  . metoprolol succinate  12.5 mg Oral Daily  . morphine  15 mg Oral Q12H  . potassium chloride SA  20 mEq Oral Daily  . primidone  50 mg Oral QHS  . QUEtiapine  25 mg Oral QHS  . rOPINIRole  0.75 mg Oral QHS  . senna  1 tablet Oral BID  . sodium chloride flush  3 mL Intravenous Q12H  . vitamin B-12  1,000 mcg Oral Daily   Continuous Infusions: . sodium chloride 10 mL/hr at 07/12/19 0800  . sodium chloride    . ceFEPime (MAXIPIME) IV 2 g (07/12/19 1017)  . [START ON 07/13/2019] vancomycin     PRN Meds:.acetaminophen **OR** acetaminophen, ALPRAZolam, bisacodyl, diphenhydrAMINE, HYDROcodone-acetaminophen, HYDROmorphone (DILAUDID) injection, hydrOXYzine, menthol-cetylpyridinium **OR** phenol, ondansetron **OR** ondansetron (ZOFRAN) IV, oxyCODONE, oxyCODONE, polyethylene glycol, senna-docusate, sodium chloride flush, sodium phosphate   SUBJECTIVE: Her pain is under better control today.  Yesterday 5 cc of "viscous, serosanguineous" fluid was aspirated from the L3-4 level.  No organisms were seen on Gram stain and cultures are pending.  Today she had some  itching at the site of her right forearm IV while vancomycin was infusing.  I suspect the IV had infiltrated.  Review of Systems: Review of Systems  Constitutional: Negative for fever.  Gastrointestinal: Negative for abdominal pain, diarrhea, nausea and vomiting.  Musculoskeletal: Positive for back pain.  Skin: Positive for itching.    Allergies  Allergen Reactions  . Ampicillin Hives and Other (See Comments)    Severe reaction in February 2017 1.5 month to have hives to go away  . Penicillins Hives and Other (See Comments)    Severe reaction in February 2017 1.5 month to have hives to go away Has patient had a PCN reaction causing immediate rash, facial/tongue/throat swelling, SOB or lightheadedness with hypotension: #  #  #  YES  #  #  #  Has patient had a PCN reaction causing severe rash involving mucus membranes or skin necrosis: No Has patient had a PCN reaction that required hospitalization No Has patient had a PCN reaction occurring within the last 10 years: No   . Gabapentin Swelling    UNSPECIFIED REACTION   . Other Itching    UNSPECIFIED Analgesics  . Codeine Nausea And Vomiting  . Hydrocodone-Acetaminophen Itching    OBJECTIVE: Vitals:   07/11/19 2102 07/12/19 0555 07/12/19 0745 07/12/19 1117  BP: 132/72 (!) 150/66 (!) 135/55 (!) 149/82  Pulse:  87 88 77  Resp:  18 15 15   Temp:  98.2 F (36.8 C) 98.3 F (36.8 C) 98.4 F (36.9 C)  TempSrc:  Oral Oral Oral  SpO2:  95% 99% 97%   There is no height or weight on file to calculate BMI.  Physical Exam Constitutional:      Comments: She is resting quietly in bed.  She appears much more comfortable today.  Skin:    Comments: She has some splotchy erythema and swelling of the right forearm and her old IV site.  Psychiatric:        Mood and Affect: Mood normal.     Lab Results Lab Results  Component Value Date   WBC 7.1 07/12/2019   HGB 9.7 (L) 07/12/2019   HCT 31.2 (L) 07/12/2019   MCV 98.4 07/12/2019    PLT 296 07/12/2019    Lab Results  Component Value Date   CREATININE 0.81 07/12/2019   BUN 11 07/12/2019   NA 140 07/12/2019   K 3.9 07/12/2019   CL 104 07/12/2019   CO2 27 07/12/2019    Lab Results  Component Value Date   ALT 29 07/10/2019   AST 23 07/10/2019   ALKPHOS 89 07/10/2019   BILITOT 0.4 07/10/2019     Microbiology: Recent Results (from the past 240 hour(s))  Culture, Urine     Status: Abnormal   Collection Time: 07/04/19  9:27 AM   Specimen: Urine, Random  Result Value Ref Range Status   Specimen Description URINE, RANDOM  Final   Special Requests   Final    NONE Performed at [facility], [address] [place], [place] [address]    Culture MULTIPLE SPECIES PRESENT, SUGGEST RECOLLECTION (A)  Final   Report Status 07/05/2019 FINAL  Final  Culture, blood (routine x 2)     Status: None   Collection Time: 07/04/19  1:53 PM   Specimen: BLOOD  Result Value Ref Range Status   Specimen Description BLOOD RIGHT ANTECUBITAL  Final   Special Requests   Final    BOTTLES DRAWN AEROBIC AND ANAEROBIC Blood Culture adequate volume Performed at [facility], [place] [address] [place], [place] [address]    Culture NO GROWTH 5 DAYS  Final   Report Status 07/09/2019 FINAL  Final  Culture, blood (routine x 2)     Status: None   Collection Time: 07/04/19  1:53 PM   Specimen: BLOOD  Result Value Ref Range Status   Specimen Description BLOOD RIGHT ANTECUBITAL  Final   Special Requests   Final    BOTTLES DRAWN AEROBIC AND ANAEROBIC Blood Culture adequate volume Performed at [facility], [place] [address] [place], [place] [address]    Culture NO GROWTH 5 DAYS  Final   Report Status 07/09/2019 FINAL  Final  Culture, Urine     Status: Abnormal   Collection Time: 07/05/19  8:31 PM   Specimen: Urine, Random  Result Value Ref Range Status   Specimen Description URINE, RANDOM  Final   Special Requests   Final    NONE Performed at [facility], [place] [address], [place], [place] [address]    Culture (A)  Final    20,000 COLONIES/mL LACTOBACILLUS SPECIES Standardized susceptibility testing for this organism is not available.    Report Status 07/07/2019 FINAL  Final  Aerobic/Anaerobic Culture (surgical/deep wound)     Status: None (Preliminary result)   Collection Time: 07/11/19  1:50 PM   Specimen: Abscess  Result Value Ref Range Status   Specimen Description ABSCESS LEFT LUMBAR PARASPINAL POSTOP  Final   Special Requests ASPIRATE  Final   Gram Stain   Final    ABUNDANT WBC PRESENT, PREDOMINANTLY PMN NO ORGANISMS SEEN    Culture   Final    CULTURE REINCUBATED FOR BETTER GROWTH Performed at [facility], [address], [place], [place] [address]    Report Status PENDING  Incomplete    [name], MD [facility] for Infectious [facility] [phone] pager   [phone] cell 07/12/2019, 3:06 PM

## 2019-07-12 NOTE — Progress Notes (Signed)
Potential Antibiotic Adverse Reaction  Spoke with patient concerning her potential adverse reaction to vancomycin. Patient endorses swelling, itching in arm (no systemic itching), and redness at IV site beginning approximately 30 minutes after initiation of vancomycin infusion. She denies having experienced similar symptoms before, and she states none of her previous medication allergies have caused a similiar reaction.   RN Harriet Butte also present during conversation and able to provide further context. RN stated having started the vancomycin approximately 45 minutes after the end of the patient's cefepime infusion. Further confirmed noticing swelling and redness in the patient's right arm about 30 minutes into the vancomycin infusion. Vancomycin was stopped at that time. RN placed IV team consult for new IV placement.   Given patient history and physical presentation, it seems possible that this reaction was caused by an IV infiltration rather than a reaction to vancomycin. However, recommend preceding with caution. Recommend holding vancomycin until new IV can be placed in patient's left arm, then restarting the remaining dose at a slower rate (see below)  Plan: -Give vancomycin 1750 mg IV over 3 hours -Monitor closely for adverse reaction and stop if redness, swelling, itching, pain noticed. -If patient continues to have an adverse reaction to vancomycin, recommend switching to daptomycin 8 mg/kg IV q24h.  Claudina Lick, PharmD Candidate 07/12/19

## 2019-07-13 MED ORDER — CHLORHEXIDINE GLUCONATE CLOTH 2 % EX PADS
6.0000 | MEDICATED_PAD | Freq: Every day | CUTANEOUS | Status: DC
  Administered 2019-07-14 – 2019-08-02 (×19): 6 via TOPICAL

## 2019-07-13 MED ORDER — SODIUM CHLORIDE 0.9% FLUSH: 10.0000 mL | INTRAVENOUS | Status: DC | PRN

## 2019-07-13 MED ORDER — SODIUM CHLORIDE 0.9% FLUSH
10.0000 mL | Freq: Two times a day (BID) | INTRAVENOUS | Status: DC
  Administered 2019-07-15 – 2019-08-02 (×21): 10 mL

## 2019-07-13 NOTE — Progress Notes (Signed)
Peripherally Inserted Central Catheter Placement  The IV Nurse has discussed with the patient and/or persons authorized to consent for the patient, the purpose of this procedure and the potential benefits and risks involved with this procedure.  The benefits include less needle sticks, lab draws from the catheter, and the patient may be discharged home with the catheter. Risks include, but not limited to, infection, bleeding, blood clot (thrombus formation), and puncture of an artery; nerve damage and irregular heartbeat and possibility to perform a PICC exchange if needed/ordered by physician.  Alternatives to this procedure were also discussed.  Bard Power PICC patient education guide, fact sheet on infection prevention and patient information card has been provided to patient /or left at bedside.    PICC Placement Documentation  PICC Single Lumen 07/13/19 PICC Right Brachial 39 cm 0 cm (Active)  Indication for Insertion or Continuance of Line Home intravenous therapies (PICC only) 07/13/19 1800  Exposed Catheter (cm) 0 cm 07/13/19 1800  Site Assessment Clean;Dry;Intact 07/13/19 1800  Line Status Flushed;Blood return noted;Saline locked 07/13/19 1800  Dressing Type Transparent 07/13/19 1800  Dressing Status Clean;Dry;Intact;Antimicrobial disc in place 07/13/19 1800  [place] I checked and tightened 07/13/19 1800  Line Adjustment (NICU/IV Team Only) No 07/13/19 1800  Dressing Intervention New dressing 07/13/19 1800  Dressing Change Due 07/20/19 07/13/19 1800       Lorenza Cambridge 07/13/2019, 6:30 PM

## 2019-07-13 NOTE — Progress Notes (Signed)
Subjective: The patient is alert and pleasant.  She complains of back pain and spasms.  Objective: Vital signs in last 24 hours: Temp:  [98.4 F (36.9 C)-99 F (37.2 C)] 98.4 F (36.9 C) (03/27 0836) Pulse Rate:  [63-90] 76 (03/27 0836) Resp:  [15-18] 16 (03/27 0836) BP: (145-180)/(70-92) 164/70 (03/27 0836) SpO2:  [94 %-98 %] 95 % (03/27 0836) Estimated body mass index is 38.21 kg/m as calculated from the following:   Height as of 06/28/19: 5\' 8"  (1.727 m).   Weight as of 06/28/19: 114 kg.   Intake/Output from previous day: 03/26 0701 - 03/27 0700 In: 230 [P.O.:200; I.V.:30] Out: -  Intake/Output this shift: No intake/output data recorded.  Physical exam the patient is alert and oriented.  She is moving her lower extremities well.  Lab Results: Recent Labs    07/12/19 0405  WBC 7.1  HGB 9.7*  HCT 31.2*  PLT 296   BMET Recent Labs    07/12/19 0405  NA 140  K 3.9  CL 104  CO2 27  GLUCOSE 112*  BUN 11  CREATININE 0.81  CALCIUM 9.0    Studies/Results: CT ASPIRATION  Result Date: 07/11/2019 CLINICAL DATA:  Low back pain post fusion surgery. Recent CT shows paraspinal lumbar collection. Aspiration requested EXAM: CT GUIDED NEEDLE ASPIRATE BIOPSY OF LUMBAR PARASPINAL COLLECTION ANESTHESIA/SEDATION: Intravenous Fentanyl 120mcg and Versed 3mg  were administered as conscious sedation during continuous monitoring of the patient's level of consciousness and physiological / cardiorespiratory status by the radiology RN, with a total moderate sedation time of 13 minutes. PROCEDURE: The procedure risks, benefits, and alternatives were explained to the patient. Questions regarding the procedure were encouraged and answered. The patient understands and consents to the procedure. Patient placed prone. Select axial scans through the lumbar spine obtained. Using the previous contrast-enhanced scanned from 07/09/2019 for comparison, the left paraspinal collection at the L3-4 level was  localized and an appropriate skin entry site was determined and marked. The operative field was prepped with chlorhexidinein a sterile fashion, and a sterile drape was applied covering the operative field. A sterile gown and sterile gloves were used for the procedure. Local anesthesia was provided with 1% Lidocaine. Under CT fluoroscopic guidance, 18 gauge trocar needle advanced into the collection. Less than 5 mL of viscous serosanguineous fluid were aspirated, sent for Gram stain and culture. Postprocedure scans show no hemorrhage or other apparent complication. The patient tolerated the procedure well. COMPLICATIONS: None immediate FINDINGS: The left lumbar paraspinal collection was localized under CT as above. A sample of the collection was obtained and sent for Gram stain and culture. IMPRESSION: 1. Technically successful CT-guided aspiration, lumbar paraspinal collection. Electronically Signed   By: Lucrezia Europe M.D.   On: 07/11/2019 16:22   [place] EKG SITE RITE  Result Date: 07/12/2019 If Site Rite image not attached, placement could not be confirmed due to current cardiac rhythm.   Assessment/Plan: Wound infection: We are awaiting final cultures and recommendations from ID.  PICC line is pending.  I have answered all her questions.  LOS: 2 days     Ophelia Charter 07/13/2019, 9:48 AM

## 2019-07-14 LAB — BASIC METABOLIC PANEL
Anion gap: 11 (ref 5–15)
BUN: 10 mg/dL (ref 8–23)
CO2: 24 mmol/L (ref 22–32)
Calcium: 9.3 mg/dL (ref 8.9–10.3)
Chloride: 99 mmol/L (ref 98–111)
Creatinine, Ser: 0.79 mg/dL (ref 0.44–1.00)
GFR calc Af Amer: >60 mL/min (ref >60)
GFR calc non Af Amer: >60 mL/min (ref >60)
Glucose, Bld: 116 mg/dL — ABNORMAL HIGH (ref 70–99)
Potassium: 4 mmol/L (ref 3.5–5.1)
Sodium: 134 mmol/L — ABNORMAL LOW (ref 135–145)

## 2019-07-14 MED ORDER — CEFAZOLIN SODIUM-DEXTROSE 2-4 GM/100ML-% IV SOLN
2.0000 g | Freq: Three times a day (TID) | INTRAVENOUS | Status: DC
  Administered 2019-07-14 – 2019-08-02 (×56): 2 g via INTRAVENOUS
  Filled 2019-07-14 (×59): qty 100

## 2019-07-14 NOTE — Progress Notes (Addendum)
      INFECTIOUS DISEASE ATTENDING ADDENDUM:   Date: 07/14/2019  Patient name: Brandi Stephens  Medical record number: [id number]  Date of birth: November 30, 1945   Cultures growing pure MS COAg negative staph  We will plan on 6 weeks of therapy with re-evaluation in clinic  Diagnosis: Lumbar infection  Culture Result: MS coag negative staphylococcus  Allergies  Allergen Reactions  . Ampicillin Hives and Other (See Comments)    Severe reaction in February 2017 1.5 month to have hives to go away  . Penicillins Hives and Other (See Comments)    Severe reaction in February 2017 1.5 month to have hives to go away Has patient had a PCN reaction causing immediate rash, facial/tongue/throat swelling, SOB or lightheadedness with hypotension: #  #  #  YES  #  #  #  Has patient had a PCN reaction causing severe rash involving mucus membranes or skin necrosis: No Has patient had a PCN reaction that required hospitalization No Has patient had a PCN reaction occurring within the last 10 years: No   . Gabapentin Swelling    UNSPECIFIED REACTION   . Other Itching    UNSPECIFIED Analgesics  . Codeine Nausea And Vomiting  . Hydrocodone-Acetaminophen Itching    OPAT Orders Discharge antibiotics:  Cefazolin 2 grams IV q 8 hours    Duration:  6 weeks  End Date:    [facility] Care Per Protocol:    Labs  weekly while on IV antibiotics: x__ CBC with differential x__ BMP w GFR/CMP x__ CRP _x_ ESR   _x_ Please pull PIC at completion of IV antibiotics __ Please leave PIC in place until doctor has seen patient or been notified  Fax weekly labs to [phone]   [place] has an appointment on 08/08/2019 with Dr. Megan Salon at [facility] for [facility] Disease is located in the [facility] at  [address] in [place].  [address], which is located to the left of the elevators.  Phone: [phone]  Fax: [phone]  [email or web address]   She should arrive 15 minutes prior to her appt  We will sign off for now  Please call with further questions.    Alcide Evener 07/14/2019, 3:32 PM

## 2019-07-14 NOTE — Progress Notes (Signed)
Subjective: The patient is alert and pleasant.  She continues to complain of muscle spasms.  Objective: Vital signs in last 24 hours: Temp:  [98.3 F (36.8 C)-99.1 F (37.3 C)] 98.4 F (36.9 C) (03/28 1201) Pulse Rate:  [80-90] 88 (03/28 1201) Resp:  [16-20] 20 (03/28 1201) BP: (157-188)/(67-86) 163/85 (03/28 1201) SpO2:  [92 %-96 %] 95 % (03/28 1201) Estimated body mass index is 38.21 kg/m as calculated from the following:   Height as of 06/28/19: 5\' 8"  (1.727 m).   Weight as of 06/28/19: 114 kg.   Intake/Output from previous day: 03/27 0701 - 03/28 0700 In: 120 [P.O.:120] Out: 0  Intake/Output this shift: Total I/O In: 120 [P.O.:120] Out: -   Physical exam the patient is alert and oriented.  She is moving her lower extremities well.  Lab Results: Recent Labs    07/12/19 0405  WBC 7.1  HGB 9.7*  HCT 31.2*  PLT 296   BMET Recent Labs    07/12/19 0405 07/14/19 0655  NA 140 134*  K 3.9 4.0  CL 104 99  CO2 27 24  GLUCOSE 112* 116*  BUN 11 10  CREATININE 0.81 0.79  CALCIUM 9.0 9.3    Studies/Results: [place] EKG SITE RITE  Result Date: 07/12/2019 If Site Rite image not attached, placement could not be confirmed due to current cardiac rhythm.   Assessment/Plan: Lumbar wound infection: Her PICC line was placed yesterday.  We will work on arrangements for home IV antibiotics directed by infectious disease.  LOS: 3 days     Ophelia Charter 07/14/2019, 12:49 PM

## 2019-07-14 NOTE — Progress Notes (Signed)
PHARMACY CONSULT NOTE FOR:  OUTPATIENT  PARENTERAL ANTIBIOTIC THERAPY (OPAT)  Indication: lumbar infection Regimen: Cefazolin 2g IV q 8 hrs End date: 08/21/2019  IV antibiotic discharge orders are pended. To discharging provider:  please sign these orders via discharge navigator,  Select New Orders & click on the button choice - Manage This Unsigned Work.     Thank you for allowing pharmacy to be a part of this patient's care.  Marguerite Olea, [facility] Clinical Pharmacist Phone [phone]  07/14/2019 4:13 PM

## 2019-07-14 NOTE — Progress Notes (Signed)
Pharmacy Antibiotic Note  Brandi Stephens is a 74 y.o. female admitted on 07/11/2019 with increased back pain, left leg weakness, and abnormal outpatient blood work. Pharmacy has been consulted for vancomycin and cefepime dosing for postoperative lumbar infection.   Patient underwent low back decompression and fusion approximately 1 month ago. Had an additional procedure earlier this month with worsening pain since. During admission she has continued to have severe, debilitating pain and muscle spasms preventing work with PT and safe discharge home.   She was started on vanc and cefepime for her infection. The lumbar aspirate culture came back with MSSE today. Dr. Tommy Medal has changed her abx to cefazolin.   CrCl 85 ml/min Scr<1 WBC <10p  Plan: Dc vanc/cefepime Cefazolin 2g IV q8  Weight: 255 lb (115.7 kg)  Temp (24hrs), Avg:98.6 F (37 C), Min:98.3 F (36.8 C), Max:98.9 F (37.2 C)  Recent Labs  Lab 07/08/19 0542 07/09/19 0548 07/10/19 0512 07/12/19 0405 07/14/19 0655  WBC 10.3 8.5 9.3 7.1  --   CREATININE 0.80 0.74 0.73 0.81 0.79    Estimated Creatinine Clearance: 84.9 mL/min (by C-G formula based on SCr of 0.79 mg/dL).    Allergies  Allergen Reactions  . Ampicillin Hives and Other (See Comments)    Severe reaction in February 2017 1.5 month to have hives to go away  . Penicillins Hives and Other (See Comments)    Severe reaction in February 2017 1.5 month to have hives to go away Has patient had a PCN reaction causing immediate rash, facial/tongue/throat swelling, SOB or lightheadedness with hypotension: #  #  #  YES  #  #  #  Has patient had a PCN reaction causing severe rash involving mucus membranes or skin necrosis: No Has patient had a PCN reaction that required hospitalization No Has patient had a PCN reaction occurring within the last 10 years: No   . Gabapentin Swelling    UNSPECIFIED REACTION   . Other Itching    UNSPECIFIED Analgesics  . Codeine Nausea  And Vomiting  . Hydrocodone-Acetaminophen Itching    Antimicrobials this admission: Vancomycin 3/25 >>3/28 Cefepime 3/25 >>3/28 Cefazolin 3/28>>  Dose adjustments this admission: N/A  Microbiology results: -3/18 UCx: multiple species > sent for recollection > lactobacillus species -3/18 BCx: negative -3/25 Abscess aspirate: MSSE  Onnie Boer, PharmD, BCIDP, AAHIVP, CPP Infectious Disease Pharmacist 07/14/2019 2:15 PM

## 2019-07-15 LAB — BASIC METABOLIC PANEL
Anion gap: 13 (ref 5–15)
BUN: 9 mg/dL (ref 8–23)
CO2: 23 mmol/L (ref 22–32)
Calcium: 9.2 mg/dL (ref 8.9–10.3)
Chloride: 99 mmol/L (ref 98–111)
Creatinine, Ser: 0.81 mg/dL (ref 0.44–1.00)
GFR calc Af Amer: >60 mL/min (ref >60)
GFR calc non Af Amer: >60 mL/min (ref >60)
Glucose, Bld: 136 mg/dL — ABNORMAL HIGH (ref 70–99)
Potassium: 3.7 mmol/L (ref 3.5–5.1)
Sodium: 135 mmol/L (ref 135–145)

## 2019-07-15 MED ORDER — DIAZEPAM 5 MG PO TABS
5.0000 mg | ORAL_TABLET | Freq: Four times a day (QID) | ORAL | Status: DC | PRN
  Administered 2019-07-15 – 2019-08-02 (×33): 5 mg via ORAL
  Filled 2019-07-15 (×34): qty 1

## 2019-07-15 NOTE — Evaluation (Signed)
Physical Therapy Evaluation Patient Details Name: Brandi Stephens MRN: [id number] DOB: 09-17-1945 Today's Date: 07/15/2019   History of Present Illness  74 yo returned to acute services 07/10/19 from CIR due to CT scan revealed loosening of the L2 screws and a small paraspinal abscess. 3/24 aspiration by radiology PMHx- HTN, CKD, 05/30/2019 patient underwent lumbar surgery (both anterior and posterior approaches) and was discharged home, but developed LLQ pain and severe muscle spasms. Presented to hospital 06/25/2019 with increasing low back pain radiating to the lower extremities. Discharged to CIR 06/28/19-07/10/19  Clinical Impression   Pt admitted with above diagnosis. Patient with complicated medical course since 05/30/19 back surgery with periods of limited activity due to pain. Currently limited by both spasms/pain and vertigo. Vertigo with sudden onset 07/14/19 and describes room spinning when she transitions to sit or stand. Currently only able to ambulate 10 feet due to spinning/dizziness. Her discharge home is further complicated by having her bedroom and only full bathroom upstairs on the second floor. Pt currently with functional limitations due to the deficits listed below (see PT Problem List). Pt will benefit from skilled PT to increase their independence and safety with mobility to allow discharge to the venue listed below.      Follow Up Recommendations CIR;Supervision/Assistance - 24 hour (possibly home with HHPT if vertigo subsides; pain control improves)    Equipment Recommendations  None recommended by PT    Recommendations for Other Services       Precautions / Restrictions Precautions Precautions: Back;Fall Precaution Booklet Issued: No Precaution Comments: verbally reviewed back precautions.  Required Braces or Orthoses: (no brace) Restrictions Weight Bearing Restrictions: No      Mobility  Bed Mobility Overal bed mobility: Needs Assistance Bed Mobility:  Rolling;Sidelying to Sit;Sit to Sidelying Rolling: Supervision(with rail) Sidelying to sit: Min guard(with rail)     Sit to sidelying: Min assist General bed mobility comments: min assist for legs up onto bed  Transfers Overall transfer level: Needs assistance Equipment used: Rolling walker (2 wheeled) Transfers: Sit to/from Stand Sit to Stand: Min guard         General transfer comment: bed elevated to simulate height of bed at home  Ambulation/Gait Ambulation/Gait assistance: Min guard;Min assist Gait Distance (Feet): 10 Feet Assistive device: Rolling walker (2 wheeled) Gait Pattern/deviations: Step-through pattern;Decreased stride length Gait velocity: decreased   General Gait Details: minguard assist due to reports of vertigo  Stairs            Wheelchair Mobility    Modified Rankin (Stroke Patients Only)       Balance Overall balance assessment: Needs assistance Sitting-balance support: Bilateral upper extremity supported;Feet supported Sitting balance-Leahy Scale: Fair(requires UE support for pain not balance)     Standing balance support: Bilateral upper extremity supported;During functional activity Standing balance-Leahy Scale: Poor                               Pertinent Vitals/Pain Pain Assessment: 0-10 Pain Score: 10-Worst pain ever(when spasms hit) Pain Location: back; LLQ Pain Descriptors / Indicators: Grimacing;Guarding;Discomfort;Spasm;Sharp Pain Intervention(s): Limited activity within patient's tolerance;Monitored during session;Premedicated before session;Repositioned;Relaxation    Home Living Family/patient expects to be discharged to:: Private residence Living Arrangements: Spouse/significant other;Children Available Help at Discharge: Family;Available 24 hours/day Type of Home: House Home Access: Stairs to enter Entrance Stairs-Rails: None Entrance Stairs-Number of Steps: 3 Home Layout: Two level;Bed/bath  upstairs Home Equipment: Walker - 2 wheels;Cane -  single point;Grab bars - tub/shower;Walker - 4 wheels;Bedside commode;Shower seat;Tub bench(has ordered bed rail to attach to adjustable bed; bidet) Additional Comments: patient owns tub bench but was standing in the shower prior to this admission with assistance of daughter    Prior Function Level of Independence: Needs assistance   Gait / Transfers Assistance Needed: on CIR was walking up to 220 ft; up/down 14 steps; however did fall on the steps her last day on CIR--reports she tried to lead with her weak leg "not thinking"     Comments: reports wanting to enjoy playing with her 19 y.o. granddaughter and walk outside     Hand Dominance   Dominant Hand: Right    Extremity/Trunk Assessment   Upper Extremity Assessment Upper Extremity Assessment: Overall WFL for tasks assessed    Lower Extremity Assessment Lower Extremity Assessment: Generalized weakness(due to prolonged hospitalization)    Cervical / Trunk Assessment Cervical / Trunk Assessment: Other exceptions Cervical / Trunk Exceptions: recent lumbar surgery.   Communication   Communication: No difficulties  Cognition Arousal/Alertness: Awake/alert Behavior During Therapy: WFL for tasks assessed/performed Overall Cognitive Status: Within Functional Limits for tasks assessed                                        General Comments General comments (skin integrity, edema, etc.): Discussed multiple options to make bed mobility less painful (including ?rent recliner, ways to manipulate her adjustable bed). Discussed option of staying downstairs (only 1/2 bath) and wants to use her new adjustable bed (upstairs)    Exercises General Exercises - Lower Extremity Ankle Circles/Pumps: AROM;Both;10 reps Quad Sets: AROM;Both;5 reps Gluteal Sets: AROM;Both;5 reps Heel Slides: AROM;Both Straight Leg Raises: (*pt initiated and instructed NOT to do these) Other  Exercises Other Exercises: abdominal set/isometric x 5   Assessment/Plan    PT Assessment Patient needs continued PT services  PT Problem List Decreased strength;Decreased activity tolerance;Decreased balance;Decreased mobility;Pain;Decreased knowledge of use of DME;Obesity       PT Treatment Interventions Gait training;Stair training;Therapeutic exercise;Therapeutic activities;Functional mobility training;Balance training;Patient/family education;DME instruction    PT Goals (Current goals can be found in the Care Plan section)  Acute Rehab PT Goals Patient Stated Goal: to be safe to go home PT Goal Formulation: With patient Time For Goal Achievement: 07/29/19 Potential to Achieve Goals: Good    Frequency Min 5X/week   Barriers to discharge Inaccessible home environment bedroom on 2nd floor    Co-evaluation               AM-PAC PT "6 Clicks" Mobility  Outcome Measure Help needed turning from your back to your side while in a flat bed without using bedrails?: A Little Help needed moving from lying on your back to sitting on the side of a flat bed without using bedrails?: A Little Help needed moving to and from a bed to a chair (including a wheelchair)?: A Little Help needed standing up from a chair using your arms (e.g., wheelchair or bedside chair)?: A Little Help needed to walk in hospital room?: A Little Help needed climbing 3-5 steps with a railing? : A Lot 6 Click Score: 17    End of Session Equipment Utilized During Treatment: Gait belt Activity Tolerance: Treatment limited secondary to medical complications (Comment)(vertigo) Patient left: in bed;with call bell/phone within reach Nurse Communication: Mobility status;Other (comment)(?vertigo) PT Visit Diagnosis: Unsteadiness on feet (R26.81);History of falling (  Z91.81);Pain Pain - part of body: (back)    Time: [id number] PT Time Calculation (min) (ACUTE ONLY): 59 min   Charges:   PT Evaluation $PT Eval  Moderate Complexity: 1 Mod PT Treatments $Therapeutic Activity: 23-37 mins $Self Care/Home Management: 8-22         Arby Barrette, PT Pager [phone]   Rexanne Mano 07/15/2019, 5:46 PM

## 2019-07-15 NOTE — Progress Notes (Signed)
   Providing Compassionate, Quality Care - Together   Subjective: Patient reports significant intermittent muscle spasms. She would like to discharge home, but feels she won't be able to manage in her current condition at home.  Objective: Vital signs in last 24 hours: Temp:  [98.2 F (36.8 C)-98.4 F (36.9 C)] 98.3 F (36.8 C) (03/29 1136) Pulse Rate:  [88-97] 93 (03/29 1136) Resp:  [13-20] 13 (03/29 1136) BP: (136-183)/(77-94) 136/82 (03/29 1136) SpO2:  [93 %-98 %] 93 % (03/29 1136) Weight:  [115.7 kg] 115.7 kg (03/28 1400)  Intake/Output from previous day: 03/28 0701 - 03/29 0700 In: 625 [P.O.:390; I.V.:10; IV Piggyback:225] Out: -  Intake/Output this shift: No intake/output data recorded.  Alert and oriented x 4 PERRLA CN II-XII grossly intact MAE, Strength and sensation grossly intact   Lab Results: No results for input(s): WBC, HGB, HCT, PLT in the last 72 hours. BMET Recent Labs    07/14/19 0655 07/15/19 0538  NA 134* 135  K 4.0 3.7  CL 99 99  CO2 24 23  GLUCOSE 116* 136*  BUN 10 9  CREATININE 0.79 0.81  CALCIUM 9.3 9.2    Studies/Results: No results found.  Assessment/Plan: Patient underwent an L2 laminectomy with facetectomy, placement of anterior interbody device (Medtronic Rise 7 mm lordotic expandable cages), followed by extension of previous instrumentation at L2-S1, including removal of previous rod. This was performed by Dr. Kathyrn Sheriff on 05/30/2019. She was discharged, but developed LLQ pain and severe muscle spasms. CT scan revealed loosening of the L2 screws and a small paraspinal abscess. A PICC line has been placed and infectious disease is recommending 6 weeks of cefazolin IV.   LOS: 4 days   -Start to mobilize patient; therapies ordered. -Discontinued Xanax that was ordered for anxiety (patient stated she does not take this medication). Added 5 mg diazepam q8 hours prn for muscle spasms -Hopeful patient feels ready for discharge home in  the next day or two   Viona Gilmore, DNP, AGNP-C Nurse Practitioner  [facility] [place]. [address], [place], [place] [address] P: [phone]    F: [phone]  07/15/2019, 11:38 AM

## 2019-07-15 NOTE — TOC Initial Note (Signed)
Transition of Care (TOC) - Initial/Assessment Note  Marvetta Gibbons RN,BSN Transitions of Care Unit 4NP (non trauma) - RN Case Manager [phone]   Patient Details  Name: Brandi Stephens MRN: [id number] Date of Birth: 1946-01-05  Transition of Care [facility]) CM/SW Contact:    Dawayne Patricia, RN Phone Number: 07/15/2019, 4:53 PM  Clinical Narrative:                 Notified pt would need 6wks of IV abx, PICC line has been placed- pt will need [place] orders prior to discharge- PT/OT evals pending. Pt was admitted from [place] rehab. CM spoke with pt at bedside- discussed transition of care needs. List provided to pt for [facility] choice- Per CMS guidelines from medicare.gov website with star ratings (copy placed in shadow chart)-pt reports she has used [facility] in past would like to use them again- also discussed IV pharmacy needs- and ID referral to [facility]- pt is agreeable to using [facility] for pharmacy needs. Address, phone # and PCP all confirmed with pt in epic. Pt will have transportation home on discharge. States her daughters will assist her when she gets home, spouse also at home but not able to assist physically a lot.  Call made to [facility] with [facility]- for [facility] referral- however they are unable to accept at this time due to staffing. Informed pt and she will re-look at [facility] Medicare list for alternate- CM will f/u in am for choice. Spoke with Pam for pharmacy needs who will follow for education on home IV abx needs prior to discharge.    Expected Discharge Plan: [place] Barriers to Discharge: Continued Medical Work up   Patient Goals and CMS Choice Patient states their goals for this hospitalization and ongoing recovery are:: get rid of pain, to be able to feel normal again and to walk well CMS Medicare.gov Compare Post Acute Care list provided to:: Patient Choice offered to / list presented to : Patient  Expected Discharge Plan and  Services Expected Discharge Plan: [place]   Discharge Planning Services: CM Consult Post Acute Care Choice: [place] arrangements for the past 2 months: Single Family Home                           HH Arranged: RN, IV Antibiotics HH Agency: [facility]        Prior Living Arrangements/Services Living arrangements for the past 2 months: Single Family Home Lives with:: Self, Spouse Patient language and need for interpreter reviewed:: Yes Do you feel safe going back to the place where you live?: Yes      Need for Family Participation in Patient Care: Yes (Comment) Care giver support system in place?: Yes (comment) Current home services: DME(has RW and BSC) Criminal Activity/Legal Involvement Pertinent to Current Situation/Hospitalization: No - Comment as needed  Activities of Daily Living Home Assistive Devices/Equipment: [place] Rile (specify type) ADL Screening (condition at time of admission) Patient's cognitive ability adequate to safely complete daily activities?: Yes Is the patient deaf or have difficulty hearing?: No Does the patient have difficulty seeing, even when wearing glasses/contacts?: No Does the patient have difficulty concentrating, remembering, or making decisions?: No Patient able to express need for assistance with ADLs?: Yes Does the patient have difficulty dressing or bathing?: Yes Independently performs ADLs?: No Communication: Needs assistance Is this a change from baseline?: Pre-admission baseline Dressing (OT): Needs assistance  Is this a change from baseline?: Change from baseline, expected to last >3 days Grooming: Needs assistance Is this a change from baseline?: Change from baseline, expected to last >3 days Feeding: Independent Bathing: Needs assistance Is this a change from baseline?: Change from baseline, expected to last >3 days Toileting: Needs assistance Is this a change from baseline?: Change from baseline,  expected to last >3days In/Out Bed: Needs assistance Is this a change from baseline?: Change from baseline, expected to last >3 days Walks in Home: Needs assistance Is this a change from baseline?: Change from baseline, expected to last >3 days Does the patient have difficulty walking or climbing stairs?: Yes Weakness of Legs: Both Weakness of Arms/Hands: None  Permission Sought/Granted Permission sought to share information with : Chartered certified accountant granted to share information with : Yes, Verbal Permission Granted     Permission granted to share info w AGENCY: [place] agency        Emotional Assessment Appearance:: Appears stated age Attitude/Demeanor/Rapport: Engaged Affect (typically observed): Appropriate Orientation: : Oriented to Self, Oriented to Place, Oriented to  Time, Oriented to Situation Alcohol / Substance Use: Not Applicable Psych Involvement: No (comment)  Admission diagnosis:  Post op infection [T81.40XA] Patient Active Problem List   Diagnosis Date Noted  . Post op infection 07/11/2019  . Muscle spasm   . Hypokalemia   . Status post lumbar spinal fusion 07/10/2019  . Postoperative pain   . Acute blood loss anemia   . Labile blood pressure   . Tobacco abuse   . Hypoalbuminemia due to protein-calorie malnutrition ([place])   . Anxiety 07/05/2019  . Depression 07/05/2019  . Chronic diastolic CHF (congestive heart failure) ([place]) 07/05/2019  . Obesity (BMI 30-39.9) 07/05/2019  . ETOH abuse 07/05/2019  . Lumbar radiculopathy 06/28/2019  . Spinal stenosis of lumbar region 06/28/2019  . Acute on chronic anemia   . Tremors of nervous system   . AKI (acute kidney injury) ([place])   . Chronic pain syndrome   . Acute renal failure superimposed on stage 3 chronic kidney disease ([place]) 06/25/2019  . Acute osteomyelitis of lumbar spine ([place]) 06/25/2019  . Weakness of left lower extremity   . Abdominal pain 06/24/2019  . Nausea 06/24/2019  . Abnormal  urine odor 06/24/2019  . History of sepsis 06/24/2019  . Recent major surgery 06/24/2019  . History of diverticulosis 06/24/2019  . Elevated PTHrP level 05/09/2019  . Paresthesia 02/12/2019  . Low back pain 02/12/2019  . Neuropathy 12/20/2018  . Lumbar spinal stenosis 06/29/2018  . Preoperative clearance 06/04/2018  . Abnormal CT of the chest 06/04/2018  . Smoker 05/17/2018  . Hypothyroidism 11/30/2017  . Thoracic aortic atherosclerosis ([place]) 09/14/2016  . Benign essential tremor 09/09/2016  . RLS (restless legs syndrome) 09/09/2016  . H/O adenomatous polyp of colon 07/06/2016  . PVC's (premature ventricular contractions) 03/29/2016  . Preoperative cardiovascular examination 03/29/2016  . Grade I diastolic dysfunction 0000000  . Palpitations 01/11/2016  . Shortness of breath 01/11/2016  . Former cigarette smoker 01/11/2016  . Leg edema 01/11/2016  . Chronic cough 10/28/2015  . 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 Malignant neoplasm of upper-inner quadrant of left breast in female, estrogen receptor positive ([place]) 07/14/2015  . Prediabetes 07/08/2015  . CKD (chronic kidney disease), stage III 07/08/2015  . Dependent edema 06/08/2015  . Sleep disturbance 06/08/2015  . Mixed hyperlipidemia 06/08/2015  . Generalized anxiety disorder 06/08/2015  . Essential hypertension 06/08/2015  . COPD (chronic obstructive pulmonary disease) ([place])  06/08/2015  . Gout 06/08/2015  . Macrocytic anemia   . 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 Sepsis ([place]) 05/15/2015  . Lumbar spondylosis 03/20/2015   PCP:  Girtha Rm, NP-C Pharmacy:   [facility] [place], [address] [place] [address] Phone: [phone] Fax: [phone]     Social Determinants of Health (SDOH) Interventions    Readmission Risk Interventions No flowsheet data found.

## 2019-07-15 NOTE — Progress Notes (Signed)
Patient and daughter have multiple concerns regarding discharge vs awaiting pending surgery. Relayed questions to NP, who had already spoken to patient and daughter. They still feel there is some discrepancies in what they were told earlier. Relayed this to [facility] NP who was still present on the unit. New orders received. Patient and family updated. Daughter would like to speak to infection disease provider prior to discharge, if that occurs today or tomorrow. Simmie Davies RN

## 2019-07-16 LAB — AEROBIC/ANAEROBIC CULTURE W GRAM STAIN (SURGICAL/DEEP WOUND)

## 2019-07-16 LAB — BASIC METABOLIC PANEL
Anion gap: 10 (ref 5–15)
BUN: 12 mg/dL (ref 8–23)
CO2: 24 mmol/L (ref 22–32)
Calcium: 9.5 mg/dL (ref 8.9–10.3)
Chloride: 102 mmol/L (ref 98–111)
Creatinine, Ser: 0.92 mg/dL (ref 0.44–1.00)
GFR calc Af Amer: >60 mL/min (ref >60)
GFR calc non Af Amer: >60 mL/min (ref >60)
Glucose, Bld: 115 mg/dL — ABNORMAL HIGH (ref 70–99)
Potassium: 4.1 mmol/L (ref 3.5–5.1)
Sodium: 136 mmol/L (ref 135–145)

## 2019-07-16 NOTE — Progress Notes (Signed)
Physical Therapy Treatment Patient Details Name: Brandi Stephens MRN: [id number] DOB: [birth year] Today's Date: 07/16/2019    History of Present Illness 74 yo returned to acute services 07/10/19 from CIR due to CT scan revealed loosening of the L2 screws and a small paraspinal abscess. 3/24 aspiration by radiology PMHx- HTN, CKD, 05/30/2019 patient underwent lumbar surgery (both anterior and posterior approaches) and was discharged home, but developed LLQ pain and severe muscle spasms. Presented to hospital 06/25/2019 with increasing low back pain radiating to the lower extremities. Discharged to CIR 06/28/19-07/10/19    PT Comments    Patient motivated to participate, however remains limited by severe pain/spasms. Increased time spent problem-solving how she can reduce pain by modifying how to she gets OOB (attempted from sidelying to allow legs off EOB and then use electric bed to elevate HOB as far as she could tolerate was not successful and pt too uncomfortable to wait for bed to elevate her torso). Incr time also discussing how she sits on the toilet (incr hip flexion causing incr pain/spasms) and pt ultimately agreed to allow BSC frame to be placed over toilet and set to tallest setting to allow knees lower than hips when seated. She did not have vertigo with changes in position during this session.    Current plan for timing of follow-up surgery is unclear per chart and per pt/daughter. Currently patient is not safe to ascend/descend 14 steps to her upstairs bedroom and only full bath. She reports she does not have room for a hospital bed downstairs. Her pain is currently limiting her to walking 20 ft x 2 (seated rest between).    Follow Up Recommendations  CIR;Supervision/Assistance - 24 hour(once pain better managed; ?surgery prior to d/c home)     Equipment Recommendations  None recommended by PT    Recommendations for Other Services Rehab consult     Precautions / Restrictions  Precautions Precautions: Back;Fall Precaution Booklet Issued: No Precaution Comments: verbally reviewed back precautions.  Required Braces or Orthoses: (no brace) Restrictions Weight Bearing Restrictions: No    Mobility  Bed Mobility Overal bed mobility: Needs Assistance Bed Mobility: Rolling;Sidelying to Sit;Sit to Sidelying Rolling: Supervision(with rail) Sidelying to sit: Min guard;HOB elevated(with rail)     Sit to sidelying: Min assist General bed mobility comments: denied dizziness and no nystagmus noted; pt prefers to control elevation of HOB herself--she rolls onto her side and then elevated HOB ~15 degrees; min assist for legs up onto bed with vc to stay on her side until her legs are up on bed and then roll to supine  Transfers Overall transfer level: Needs assistance Equipment used: Rolling walker (2 wheeled) Transfers: Sit to/from Stand Sit to Stand: Min guard         General transfer comment: bed elevated to simulate height of bed at home; denied dizziness and no nystagmus noted  Ambulation/Gait Ambulation/Gait assistance: Min guard Gait Distance (Feet): 20 Feet(x2) Assistive device: Rolling walker (2 wheeled) Gait Pattern/deviations: Step-through pattern;Decreased stride length Gait velocity: decreased   General Gait Details: minguard assist for safety/lines   Stairs             Wheelchair Mobility    Modified Rankin (Stroke Patients Only)       Balance Overall balance assessment: Needs assistance Sitting-balance support: Bilateral upper extremity supported;Feet supported Sitting balance-Leahy Scale: Fair(requires UE support for pain not balance)     Standing balance support: Bilateral upper extremity supported;During functional activity Standing balance-Leahy Scale: Poor Standing balance  comment: Moderate reliance on UEs                            Cognition Arousal/Alertness: Awake/alert Behavior During Therapy: WFL for  tasks assessed/performed Overall Cognitive Status: Within Functional Limits for tasks assessed                                        Exercises      General Comments General comments (skin integrity, edema, etc.): Educated pt on possible BPPV causing her vertigo. She requested assist up to bathroom prior to testing for BPPV. She had no vertigo today and further vestibular assessment deferred.       Pertinent Vitals/Pain Pain Assessment: 0-10 Pain Score: 10-Worst pain ever(when spasm hits) Pain Location: back; LLQ Pain Descriptors / Indicators: Grimacing;Guarding;Discomfort;Spasm;Sharp Pain Intervention(s): Limited activity within patient's tolerance;Monitored during session;Premedicated before session;Repositioned    Home Living                      Prior Function            PT Goals (current goals can now be found in the care plan section) Acute Rehab PT Goals Patient Stated Goal: to be safe to go home Time For Goal Achievement: 07/29/19 Potential to Achieve Goals: Good Progress towards PT goals: Progressing toward goals    Frequency    Min 5X/week      PT Plan Current plan remains appropriate    Co-evaluation              AM-PAC PT "6 Clicks" Mobility   Outcome Measure  Help needed turning from your back to your side while in a flat bed without using bedrails?: A Little Help needed moving from lying on your back to sitting on the side of a flat bed without using bedrails?: A Little Help needed moving to and from a bed to a chair (including a wheelchair)?: A Little Help needed standing up from a chair using your arms (e.g., wheelchair or bedside chair)?: A Little Help needed to walk in hospital room?: A Little Help needed climbing 3-5 steps with a railing? : A Lot 6 Click Score: 17    End of Session Equipment Utilized During Treatment: Gait belt Activity Tolerance: Patient limited by pain Patient left: in bed;with call  bell/phone within reach;with family/visitor present;Other (comment)(OT present) Nurse Communication: Mobility status PT Visit Diagnosis: Unsteadiness on feet (R26.81);History of falling (Z91.81);Pain Pain - part of body: (back)     Time: [id number] PT Time Calculation (min) (ACUTE ONLY): 53 min  Charges:  $Gait Training: 8-22 mins $Therapeutic Activity: 23-37 mins                      Arby Barrette, PT Pager [phone]    Rexanne Mano 07/16/2019, 2:55 PM

## 2019-07-16 NOTE — Evaluation (Signed)
Occupational Therapy Evaluation Patient Details Name: Brandi Stephens MRN: [id number] DOB: Sep 07, 1945 Today's Date: 07/16/2019    History of Present Illness 74 yo returned to acute services 07/10/19 from CIR due to CT scan revealed loosening of the L2 screws and a small paraspinal abscess. 3/24 aspiration by radiology PMHx- HTN, CKD, 05/30/2019 patient underwent lumbar surgery (both anterior and posterior approaches) and was discharged home, but developed LLQ pain and severe muscle spasms. Presented to hospital 06/25/2019 with increasing low back pain radiating to the lower extremities. Discharged to CIR 06/28/19-07/10/19   Clinical Impression   PTA, pt was at [facility] for rehab after recent back surgery and was performing ADLs and mobility with therapy staff. Pt currently requiring Min A for UB ADLs, Max A for LB ADLs, and Min Guard for functional mobility. Pt highly motivated to participate in therapy. Pt limited by significant pain, muscle spasms, and episodes of vertigo. Agreeable to OOB activity despite recent mobility to bathroom. Pt presenting with decreased balance, activity tolerance, and strength. Pt will require further acute OT to optimize safe dc. Pending pt progress and possible sx, recommend dc to CIR for further therapy to increase independence and safety with ADLs.      Follow Up Recommendations  CIR;Other (comment);Supervision/Assistance - 24 hour(Pending surgery and progress)    Equipment Recommendations  None recommended by OT    Recommendations for Other Services PT consult     Precautions / Restrictions Precautions Precautions: Back;Fall Precaution Booklet Issued: No Precaution Comments: verbally reviewed back precautions.  Required Braces or Orthoses: (no brace) Restrictions Weight Bearing Restrictions: No      Mobility Bed Mobility Overal bed mobility: Needs Assistance Bed Mobility: Rolling;Sidelying to Sit;Sit to Sidelying Rolling: Supervision(with rail) Sidelying  to sit: Min guard;HOB elevated(with rail)     Sit to sidelying: Min assist General bed mobility comments: Min Guard A for safety. Pt demonstrating good performance of log roll. Min A for assisting LLE into bed  Transfers Overall transfer level: Needs assistance Equipment used: Rolling walker (2 wheeled) Transfers: Sit to/from Stand Sit to Stand: Min guard         General transfer comment: Min Guard A for safety    Balance Overall balance assessment: Needs assistance Sitting-balance support: Bilateral upper extremity supported;Feet supported Sitting balance-Leahy Scale: Fair(requires UE support for pain not balance)     Standing balance support: Bilateral upper extremity supported;During functional activity Standing balance-Leahy Scale: Poor Standing balance comment: Moderate reliance on UEs                           ADL either performed or assessed with clinical judgement   ADL Overall ADL's : Needs assistance/impaired Eating/Feeding: Set up;Bed level Eating/Feeding Details (indicate cue type and reason): Pt unable to sit upright for self feeding due to significant pain and muscle spasms. Only able to elevate HOB to ~15* Grooming: Min guard;Standing   Upper Body Bathing: Minimal assistance;Sitting   Lower Body Bathing: Maximal assistance;Sit to/from stand   Upper Body Dressing : Minimal assistance;Sitting   Lower Body Dressing: Maximal assistance;Sit to/from stand   Toilet Transfer: Ambulation;RW;Min guard(simulated in room) Armed forces technical officer Details (indicate cue type and reason): Min Guard A for safety         Functional mobility during ADLs: Min guard;Rolling walker General ADL Comments: Pt very limited by back pain and msucle spasms.      Vision Baseline Vision/History: Wears glasses(hx of cataract left eye and scheduled for  fu surgery due to ongoing impairment) Wears Glasses: Reading only Patient Visual Report: No change from baseline Vision  Assessment?: No apparent visual deficits     Perception     Praxis      Pertinent Vitals/Pain Pain Assessment: Faces Pain Score: 10-Worst pain ever(when spasm hits) Faces Pain Scale: Hurts whole lot Pain Location: back; LLQ Pain Descriptors / Indicators: Grimacing;Guarding;Discomfort;Spasm;Sharp Pain Intervention(s): Monitored during session;Limited activity within patient's tolerance;Repositioned     Hand Dominance Right   Extremity/Trunk Assessment Upper Extremity Assessment Upper Extremity Assessment: Overall WFL for tasks assessed   Lower Extremity Assessment Lower Extremity Assessment: Defer to PT evaluation   Cervical / Trunk Assessment Cervical / Trunk Assessment: Other exceptions Cervical / Trunk Exceptions: recent lumbar surgery.    Communication Communication Communication: No difficulties   Cognition Arousal/Alertness: Awake/alert Behavior During Therapy: WFL for tasks assessed/performed Overall Cognitive Status: Within Functional Limits for tasks assessed                                 General Comments: Very motivated despite significant pain   General Comments  Educated pt on possible BPPV causing her vertigo. She requested assist up to bathroom prior to testing for BPPV. She had no vertigo today and further vestibular assessment deferred.     Exercises     Shoulder Instructions      Home Living Family/patient expects to be discharged to:: Private residence Living Arrangements: Spouse/significant other;Children Available Help at Discharge: Family;Available 24 hours/day Type of Home: House Home Access: Stairs to enter [employer] of Steps: 3 Entrance Stairs-Rails: None Home Layout: Two level;Bed/bath upstairs Alternate Level Stairs-Number of Steps: 14 Alternate Level Stairs-Rails: Can reach both Bathroom Shower/Tub: Walk-in shower;Tub/shower unit   Bathroom Toilet: Handicapped height Bathroom Accessibility: Yes   Home  Equipment: Walker - 2 wheels;Cane - single point;Grab bars - tub/shower;Walker - 4 wheels;Bedside commode;Shower seat;Tub bench(has ordered bed rail to attach to adjustable bed; bidet)   Additional Comments: patient owns tub bench but was standing in the shower prior to this admission with assistance of daughter  Lives With: Spouse    Prior Functioning/Environment Level of Independence: Needs assistance  Gait / Transfers Assistance Needed: on CIR was walking up to 220 ft; up/down 14 steps; however did fall on the steps her last day on CIR--reports she tried to lead with her weak leg "not thinking" ADL's / Homemaking Assistance Needed: Reports she was working towards performing BADLs at [employer]    Comments: reports wanting to enjoy playing with her 53 y.o. granddaughter and walk outside        OT Problem List: Decreased strength;Decreased activity tolerance;Impaired balance (sitting and/or standing);Decreased safety awareness;Pain;Obesity      OT Treatment/Interventions: Self-care/ADL training;DME and/or AE instruction;Therapeutic exercise;Therapeutic activities;Balance training;Patient/family education    OT Goals(Current goals can be found in the care plan section) Acute Rehab OT Goals Patient Stated Goal: to be safe to go home OT Goal Formulation: With patient Time For Goal Achievement: 07/10/19 Potential to Achieve Goals: Good  OT Frequency: Min 2X/week   Barriers to D/C:            Co-evaluation              AM-PAC OT "6 Clicks" Daily Activity     Outcome Measure Help from another person eating meals?: None Help from another person taking care of personal grooming?: A Little Help from another person toileting, which includes  using toliet, bedpan, or urinal?: A Lot Help from another person bathing (including washing, rinsing, drying)?: A Lot Help from another person to put on and taking off regular upper body clothing?: A Little Help from another person to put on and  taking off regular lower body clothing?: A Lot 6 Click Score: 16   End of Session Equipment Utilized During Treatment: Rolling walker Nurse Communication: Mobility status  Activity Tolerance: Patient tolerated treatment well Patient left: in bed;with call bell/phone within reach  OT Visit Diagnosis: Other abnormalities of gait and mobility (R26.89);Muscle weakness (generalized) (M62.81);Pain;History of falling (Z91.81) Pain - part of body: (back)                Time: [id number] OT Time Calculation (min): 22 min Charges:  OT General Charges $OT Visit: 1 Visit OT Evaluation $OT Eval Moderate Complexity: Sylvester, OTR/L Acute Rehab Pager: [phone] Office: [place] 07/16/2019, 4:39 PM

## 2019-07-16 NOTE — Progress Notes (Signed)
   Providing Compassionate, Quality Care - Together   Subjective: Patient reports she got more rest overnight. PT reports significant dizziness upon standing. Doesn't appear to be orthostasis.  Objective: Vital signs in last 24 hours: Temp:  [97.6 F (36.4 C)-98.4 F (36.9 C)] 97.8 F (36.6 C) (03/30 0742) Pulse Rate:  [81-100] 95 (03/30 0742) Resp:  [13-22] 20 (03/30 0742) BP: (119-164)/(58-87) 139/58 (03/30 0742) SpO2:  [93 %-98 %] 95 % (03/30 0742)  Intake/Output from previous day: 03/29 0701 - 03/30 0700 In: 690 [P.O.:690] Out: -  Intake/Output this shift: No intake/output data recorded.  Alert and oriented x 4 PERRLA CN II-XII grossly intact MAE, Strength and sensation grossly intact Incision is clean, dry, and intact  Lab Results: No results for input(s): WBC, HGB, HCT, PLT in the last 72 hours. BMET Recent Labs    07/15/19 0538 07/16/19 0542  NA 135 136  K 3.7 4.1  CL 99 102  CO2 23 24  GLUCOSE 136* 115*  BUN 9 12  CREATININE 0.81 0.92  CALCIUM 9.2 9.5    Studies/Results: No results found.  Assessment/Plan: Patient underwent an L2 laminectomy with facetectomy, placement of anterior interbody device (Medtronic Rise 7 mm lordotic expandable cages), followed by extension of previous instrumentation at L2-S1, including removal of previous rod. This was performed by Dr. Kathyrn Sheriff on 05/30/2019. She was discharged, but developed LLQ pain and severe muscle spasms. CT scan revealed loosening of the L2 screws and a small paraspinal abscess. A PICC line has been placed and infectious disease is recommending 6 weeks of cefazolin IV.   LOS: 5 days    -Will see if patient is a candidate for CIR while she awaits surgery. PT doesn't feel patient will do well at home without 24 hour supervision   Viona Gilmore, [address], AGNP-C Nurse Practitioner  [facility] [address] [place], [place], [place] [address] P: [phone]    F:  [phone]  07/16/2019, 10:38 AM

## 2019-07-17 LAB — BASIC METABOLIC PANEL
Anion gap: 9 (ref 5–15)
BUN: 15 mg/dL (ref 8–23)
CO2: 23 mmol/L (ref 22–32)
Calcium: 9.1 mg/dL (ref 8.9–10.3)
Chloride: 100 mmol/L (ref 98–111)
Creatinine, Ser: 0.92 mg/dL (ref 0.44–1.00)
GFR calc Af Amer: >60 mL/min (ref >60)
GFR calc non Af Amer: >60 mL/min (ref >60)
Glucose, Bld: 108 mg/dL — ABNORMAL HIGH (ref 70–99)
Potassium: 4.1 mmol/L (ref 3.5–5.1)
Sodium: 132 mmol/L — ABNORMAL LOW (ref 135–145)

## 2019-07-17 NOTE — Progress Notes (Signed)
   Providing Compassionate, Quality Care - Together   Subjective: Patient reports dizziness is improved today. She is still getting significant muscle spasms with certain movements. She feels the valium has helped some. She has been able to rest better at night.  Objective: Vital signs in last 24 hours: Temp:  [97.6 F (36.4 C)-98.7 F (37.1 C)] 97.7 F (36.5 C) (03/31 0727) Pulse Rate:  [80-97] 84 (03/31 0727) Resp:  [14-20] 14 (03/31 0727) BP: (124-158)/(60-81) 124/68 (03/31 0727) SpO2:  [93 %-99 %] 99 % (03/31 0727)  Intake/Output from previous day: No intake/output data recorded. Intake/Output this shift: No intake/output data recorded.  Alert and oriented x 4 PERRLA CN II-XII grossly intact MAE, Strength and sensationgrosslyintact Incision is clean, dry, intact, and well-approximated  Lab Results: No results for input(s): WBC, HGB, HCT, PLT in the last 72 hours. BMET Recent Labs    07/16/19 0542 07/17/19 0631  NA 136 132*  K 4.1 4.1  CL 102 100  CO2 24 23  GLUCOSE 115* 108*  BUN 12 15  CREATININE 0.92 0.92  CALCIUM 9.5 9.1    Studies/Results: No results found.  Assessment/Plan: Patient underwent an L2 laminectomy with facetectomy, placement of anterior interbody device (Medtronic Rise 7 mm lordotic expandable cages), followed by extension of previous instrumentation at L2-S1, including removal of previous rod. This was performed by Dr. Kathyrn Sheriff on 05/30/2019. She was discharged, but developed LLQ pain and severe muscle spasms. CT scan revealed loosening of the L2 screws and a small paraspinal abscess. A PICC line has been placed and infectious disease is recommending 6 weeks of cefazolin IV.   LOS: 6 days    -Continue to mobilize   Viona Gilmore, DNP, AGNP-C Nurse Practitioner  [facility] [place] [address], [address], [place], [place] [address] P: [phone]    F: [phone]  07/17/2019, 9:02 AM

## 2019-07-17 NOTE — Progress Notes (Signed)
Physical Therapy Treatment Patient Details Name: Brandi Stephens MRN: [id number] DOB: 10-24-1945 Today's Date: 07/17/2019    History of Present Illness 74 yo returned to acute services 07/10/19 from CIR due to CT scan revealed loosening of the L2 screws and a small paraspinal abscess. 3/24 aspiration by radiology PMHx- HTN, CKD, 05/30/2019 patient underwent lumbar surgery (both anterior and posterior approaches) and was discharged home, but developed LLQ pain and severe muscle spasms. Presented to hospital 06/25/2019 with increasing low back pain radiating to the lower extremities. Discharged to CIR 06/28/19-07/10/19    PT Comments    Educated on bed-level exercises for her to complete throughout the day (pt did not recall previous education; daughter present and took notes so she can assist pt). Patient tends to push herself too far too fast and triggers spasms in abdomen and back (example with left heelslides). Did much better doing smaller ROM initially and progressively increasing ROM as tolerance improved. Patient requires continued education on safe techniques with exercises and during mobility. Plan for ?surgery prior to return home remains unclear.    Follow Up Recommendations  CIR;Supervision/Assistance - 24 hour(once pain better managed; ?surgery prior to d/c home)     Equipment Recommendations  None recommended by PT    Recommendations for Other Services Rehab consult     Precautions / Restrictions Precautions Precautions: Back;Fall Precaution Booklet Issued: No Precaution Comments: verbally reviewed back precautions Required Braces or Orthoses: (no brace) Restrictions Weight Bearing Restrictions: No    Mobility  Bed Mobility Overal bed mobility: Needs Assistance Bed Mobility: Rolling;Sidelying to Sit;Sit to Sidelying Rolling: Supervision(with rail) Sidelying to sit: Min guard;HOB elevated(with rail)     Sit to sidelying: Min assist General bed mobility comments: denied  dizziness and no nystagmus noted; pt prefers to control elevation of HOB herself--she rolls onto her side and then elevated HOB ~15 degrees; min assist for legs up onto bed with vc to stay on her side until her legs are up on bed and then roll to supine  Transfers Overall transfer level: Needs assistance Equipment used: Rolling walker (2 wheeled) Transfers: Sit to/from Stand Sit to Stand: Min guard         General transfer comment: bed elevated to simulate height of bed at home; denied dizziness and no nystagmus noted  Ambulation/Gait Ambulation/Gait assistance: Min assist Gait Distance (Feet): 20 Feet(x2) Assistive device: Rolling walker (2 wheeled) Gait Pattern/deviations: Step-through pattern;Decreased stride length Gait velocity: decreased   General Gait Details: min assist due to loss of balance to her left as negotiating threshold into bathroom   Stairs             Wheelchair Mobility    Modified Rankin (Stroke Patients Only)       Balance Overall balance assessment: Needs assistance Sitting-balance support: Bilateral upper extremity supported;Feet supported Sitting balance-Leahy Scale: Fair(requires UE support for pain not balance)     Standing balance support: Bilateral upper extremity supported;During functional activity Standing balance-Leahy Scale: Poor Standing balance comment: Moderate reliance on UEs                            Cognition Arousal/Alertness: Awake/alert Behavior During Therapy: WFL for tasks assessed/performed Overall Cognitive Status: Within Functional Limits for tasks assessed  Exercises General Exercises - Lower Extremity Ankle Circles/Pumps: AROM;Both;10 reps Heel Slides: AROM;Both;5 reps Straight Leg Raises: Other (comment)(again reinforced NOT to do this exercise) Other Exercises Other Exercises: abdominal set/isometric with counting out loud to 5 x 5  reps    General Comments        Pertinent Vitals/Pain Pain Assessment: Faces Faces Pain Scale: Hurts whole lot Pain Location: back; LLQ Pain Descriptors / Indicators: Grimacing;Guarding;Discomfort;Spasm;Sharp Pain Intervention(s): Limited activity within patient's tolerance;Monitored during session;Premedicated before session;Relaxation;Other (comment)(breathing techniques; lesser # of reps, more often )    Home Living                      Prior Function            PT Goals (current goals can now be found in the care plan section) Acute Rehab PT Goals Patient Stated Goal: to be safe to go home Time For Goal Achievement: 07/29/19 Potential to Achieve Goals: Good Progress towards PT goals: Progressing toward goals    Frequency    Min 5X/week      PT Plan Current plan remains appropriate    Co-evaluation              AM-PAC PT "6 Clicks" Mobility   Outcome Measure  Help needed turning from your back to your side while in a flat bed without using bedrails?: A Little Help needed moving from lying on your back to sitting on the side of a flat bed without using bedrails?: A Little Help needed moving to and from a bed to a chair (including a wheelchair)?: A Little Help needed standing up from a chair using your arms (e.g., wheelchair or bedside chair)?: A Little Help needed to walk in hospital room?: A Little Help needed climbing 3-5 steps with a railing? : A Lot 6 Click Score: 17    End of Session Equipment Utilized During Treatment: Gait belt Activity Tolerance: Patient limited by pain Patient left: in bed;with call bell/phone within reach;with family/visitor present Nurse Communication: Mobility status PT Visit Diagnosis: Unsteadiness on feet (R26.81);History of falling (Z91.81);Pain Pain - part of body: (back)     Time: [id number] PT Time Calculation (min) (ACUTE ONLY): 39 min  Charges:  $Gait Training: 8-22 mins $Therapeutic Exercise: 23-37  mins                      Arby Barrette, PT Pager [phone]    Brandi Stephens 07/17/2019, 12:29 PM

## 2019-07-17 NOTE — Progress Notes (Signed)
Inpatient Rehab Admissions:  Inpatient Rehab Consult received.  Chart reviewed and pt discussed with CIR team.  Pt was mobilizing with min guard and planning for d/c home day before transfer back to acute.  Rehab course significantly limited by pain/spasms.  Per neurosurg, not planning surgery at this time.  Given that pt's main limiting factor continues to be pain from abscess/loosening screws, and that she is mobilizing at the same level of assist as she was at time of d/c from CIR, feel that she is not a candidate at this time.  If she were to undergo surgery, that would would be a change in medical status and we could re-evaluate her at that time.   Signed: Shann Medal, PT, DPT Admissions Coordinator [phone] 07/17/19  11:42 AM

## 2019-07-18 LAB — BASIC METABOLIC PANEL
Anion gap: 8 (ref 5–15)
BUN: 11 mg/dL (ref 8–23)
CO2: 25 mmol/L (ref 22–32)
Calcium: 9.5 mg/dL (ref 8.9–10.3)
Chloride: 103 mmol/L (ref 98–111)
Creatinine, Ser: 0.91 mg/dL (ref 0.44–1.00)
GFR calc Af Amer: >60 mL/min (ref >60)
GFR calc non Af Amer: >60 mL/min (ref >60)
Glucose, Bld: 159 mg/dL — ABNORMAL HIGH (ref 70–99)
Potassium: 4.3 mmol/L (ref 3.5–5.1)
Sodium: 136 mmol/L (ref 135–145)

## 2019-07-18 NOTE — Progress Notes (Signed)
Patient ID: EVEA STANCO, female   DOB: 1945/06/09, 74 y.o.   MRN: [id number]          [facility]    Date of Admission:  07/11/2019   Total days of antibiotics 7        Day 5 cefazolin  Ms. 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  She is improving slowly on IV cefazolin.  I spoke with Ms. Ghattas and with her daughter by phone today.  I went over our plan to treat her for a minimum of 6 weeks with IV antibiotics then make a decision about continuing oral antibiotic therapy.  I told them that I am in agreement with surgery next week hardware removal.  This is likely to speed her recovery and greatly improved with chance that we can cure her infection.  Please call me with any other questions while she is here.  She has a follow-up appointment with me on 08/08/2019.         [name], MD [facility] [place] [facility] [phone] pager   [phone] cell 07/18/2019, 1:53 PM

## 2019-07-18 NOTE — Progress Notes (Signed)
Physical Therapy Treatment Patient Details Name: Brandi Stephens MRN: [id number] DOB: 12/16/1945 Today's Date: 07/18/2019    History of Present Illness 74 yo returned to acute services 07/10/19 from CIR due to CT scan revealed loosening of the L2 screws and a small paraspinal abscess. 3/24 aspiration by radiology PMHx- HTN, CKD, 05/30/2019 patient underwent lumbar surgery (both anterior and posterior approaches) and was discharged home, but developed LLQ pain and severe muscle spasms. Presented to hospital 06/25/2019 with increasing low back pain radiating to the lower extremities. Discharged to CIR 06/28/19-07/10/19    PT Comments    Patient understood from neurosurgery that she will remain an inpatient until surgery can be done early next week. Reports frequency of spasms is less, however intensity remains unchanged. Focused today on ambulation with max distance 85 ft with RW and min guard assist. Patient feeling a spasm was "about to start" as returning to room and assisted to bed with no severe spasm. Further exercises deferred at this time with pt/daughter reporting they will do exercises for which they've been educated later today.     Follow Up Recommendations  Other (comment)(TBD as pain control improves and ? to have surgery)     Equipment Recommendations  None recommended by PT    Recommendations for Other Services       Precautions / Restrictions Precautions Precautions: Back;Fall Precaution Booklet Issued: No Required Braces or Orthoses: (no brace) Restrictions Weight Bearing Restrictions: No    Mobility  Bed Mobility Overal bed mobility: Needs Assistance Bed Mobility: Rolling;Sidelying to Sit;Sit to Sidelying Rolling: Supervision(with rail) Sidelying to sit: Min guard;HOB elevated(with rail)     Sit to sidelying: Supervision(with rail) General bed mobility comments: OOB pt moves quickly from side to sit to avoid pain and requires closeguard for safety; she immediately  supports herself via bil UEs on RW; on return to bed again required instruction in technique for sit to side (keeping arm in front of body, lifting LLE with knee flexed to place leg on bed quickly followed by RLE)  Transfers Overall transfer level: Needs assistance Equipment used: Rolling walker (2 wheeled) Transfers: Sit to/from Stand Sit to Stand: Min guard         General transfer comment: bed elevated to simulate height of bed at home; denied dizziness and no nystagmus noted  Ambulation/Gait Ambulation/Gait assistance: Min guard Gait Distance (Feet): 10 Feet(85) Assistive device: Rolling walker (2 wheeled) Gait Pattern/deviations: Step-through pattern;Decreased stride length Gait velocity: decreased   General Gait Details: better balance as negotiating awkward threshold into bathroom; walked in hallway with cues for proximity to [employer] Mobility    Modified Rankin (Stroke Patients Only)       Balance Overall balance assessment: Needs assistance Sitting-balance support: Bilateral upper extremity supported;Feet supported Sitting balance-Leahy Scale: Fair(requires UE support for pain not balance)     Standing balance support: Bilateral upper extremity supported;During functional activity Standing balance-Leahy Scale: Poor Standing balance comment: Moderate reliance on UEs                            Cognition Arousal/Alertness: Awake/alert Behavior During Therapy: WFL for tasks assessed/performed Overall Cognitive Status: Within Functional Limits for tasks assessed  Exercises      General Comments General comments (skin integrity, edema, etc.): Daughter, [name], present. Patient reports frequency of spasms has been reducing, however severity when she has a spasm is just as bad. Reports her daughter helped her do her exercises last evening and plans to do exercises  later today.       Pertinent Vitals/Pain Pain Assessment: 0-10 Pain Score: 7  Pain Location: back; LLQ Pain Descriptors / Indicators: Grimacing;Guarding;Discomfort;Spasm;Sharp Pain Intervention(s): Limited activity within patient's tolerance;Monitored during session;Premedicated before session;Repositioned;Relaxation    Home Living                      Prior Function            PT Goals (current goals can now be found in the care plan section) Acute Rehab PT Goals Patient Stated Goal: to be safe to go home Time For Goal Achievement: 07/29/19 Potential to Achieve Goals: Good Progress towards PT goals: Progressing toward goals    Frequency    Min 5X/week      PT Plan Discharge plan needs to be updated    Co-evaluation              AM-PAC PT "6 Clicks" Mobility   Outcome Measure  Help needed turning from your back to your side while in a flat bed without using bedrails?: A Little Help needed moving from lying on your back to sitting on the side of a flat bed without using bedrails?: A Little Help needed moving to and from a bed to a chair (including a wheelchair)?: A Little Help needed standing up from a chair using your arms (e.g., wheelchair or bedside chair)?: A Little Help needed to walk in hospital room?: A Little Help needed climbing 3-5 steps with a railing? : A Lot 6 Click Score: 17    End of Session   Activity Tolerance: Patient limited by pain Patient left: in bed;with call bell/phone within reach;with family/visitor present Nurse Communication: Mobility status PT Visit Diagnosis: Unsteadiness on feet (R26.81);History of falling (Z91.81);Pain Pain - part of body: (back)     Time: [id number] PT Time Calculation (min) (ACUTE ONLY): 31 min  Charges:  $Gait Training: 23-37 mins                      Arby Barrette, PT Pager [phone]    Rexanne Mano 07/18/2019, 1:15 PM

## 2019-07-18 NOTE — Progress Notes (Signed)
Pharmacy Antibiotic Note  Brandi Stephens is a 74 y.o. female admitted on 07/11/2019 with increased back pain, left leg weakness, and abnormal outpatient blood work. Pharmacy has been consulted for vancomycin and cefepime dosing for postoperative lumbar infection.   Patient underwent low back decompression and fusion approximately 1 month ago. Had an additional procedure earlier this month with worsening pain since. During admission she has continued to have severe, debilitating pain and muscle spasms preventing work with PT and safe discharge home.   She was started on vanc and cefepime for her infection. The lumbar aspirate culture came back with MSSE and she was narrowed to cefazolin.   Plan: Continue cefazolin 2gm IV Q8H *Pharmacy will sign off as no further dose adjustments are anticipated. Will follow peripherally for discharge needs.   Weight: 115.7 kg (255 lb)  Temp (24hrs), Avg:98.3 F (36.8 C), Min:98 F (36.7 C), Max:98.8 F (37.1 C)  Recent Labs  Lab 07/12/19 0405 07/14/19 0655 07/15/19 0538 07/16/19 0542 07/17/19 0631  WBC 7.1  --   --   --   --   CREATININE 0.81 0.79 0.81 0.92 0.92    Estimated Creatinine Clearance: 73.8 mL/min (by C-G formula based on SCr of 0.92 mg/dL).    Allergies  Allergen Reactions  . Ampicillin Hives and Other (See Comments)    Severe reaction in February 2017 1.5 month to have hives to go away  . Penicillins Hives and Other (See Comments)    Severe reaction in February 2017 1.5 month to have hives to go away Has patient had a PCN reaction causing immediate rash, facial/tongue/throat swelling, SOB or lightheadedness with hypotension: #  #  #  YES  #  #  #  Has patient had a PCN reaction causing severe rash involving mucus membranes or skin necrosis: No Has patient had a PCN reaction that required hospitalization No Has patient had a PCN reaction occurring within the last 10 years: No   . Gabapentin Swelling    UNSPECIFIED REACTION   .  Other Itching    UNSPECIFIED Analgesics  . Codeine Nausea And Vomiting  . Hydrocodone-Acetaminophen Itching    Antimicrobials this admission: Vancomycin 3/25 >>3/28 Cefepime 3/25 >>3/28 Cefazolin 3/28>>  Dose adjustments this admission: N/A  Microbiology results: -3/18 UCx: multiple species > sent for recollection > lactobacillus species -3/18 BCx: negative -3/25 Abscess aspirate: MSSE  Salome Arnt, PharmD, BCPS Clinical Pharmacist Please see AMION for all pharmacy numbers 07/18/2019 9:23 AM

## 2019-07-18 NOTE — Progress Notes (Signed)
   Providing Compassionate, Quality Care - Together   Subjective: Patient reports she's had a "rough morning." She's feeling better now. Patient's daughter is at the bedside.  Objective: Vital signs in last 24 hours: Temp:  [98 F (36.7 C)-98.8 F (37.1 C)] 98 F (36.7 C) (04/01 1110) Pulse Rate:  [87-99] 91 (04/01 1110) Resp:  [11-18] 11 (04/01 1110) BP: (123-168)/(65-97) 143/65 (04/01 1110) SpO2:  [91 %-95 %] 93 % (04/01 1110)  Intake/Output from previous day: 03/31 0701 - 04/01 0700 In: 550 [P.O.:550] Out: -  Intake/Output this shift: No intake/output data recorded.  Alert and oriented x 4 PERRLA CN II-XII grossly intact MAE, Strength and sensationgrosslyintact Incision is clean, dry, intact, and well-approximated  Lab Results: No results for input(s): WBC, HGB, HCT, PLT in the last 72 hours. BMET Recent Labs    07/16/19 0542 07/17/19 0631  NA 136 132*  K 4.1 4.1  CL 102 100  CO2 24 23  GLUCOSE 115* 108*  BUN 12 15  CREATININE 0.92 0.92  CALCIUM 9.5 9.1    Studies/Results: No results found.  Assessment/Plan: Patient underwent an L2 laminectomy with facetectomy, placement of anterior interbody device ([employer]tronic Rise 7 mm lordotic expandable cages), followed by extension of previous instrumentation at L2-S1, including removal of previous rod. This was performed by Dr. Kathyrn Sheriff on 05/30/2019. She was discharged, but developed LLQ pain and severe muscle spasms. CT scan revealed loosening of the L2 screws and a small paraspinal abscess. A PICC line has been placed and infectious disease is recommending 6 weeks of cefazolin IV. I answered all of the patient's and her daughter's questions. I spoke with Dr. Megan Salon of ID, who will also speak to the patient and her daughter to answer any remaining questions.   LOS: 7 days    -Continue to mobilize -Surgery for hardware removal by Dr. Kathyrn Sheriff likely next week   Viona Gilmore, DNP, AGNP-C Nurse  Practitioner  [facility] [place]. [address], [address], [place], [place] [address] P: [phone]    F: [phone]  07/18/2019, 11:52 AM

## 2019-07-19 NOTE — Progress Notes (Signed)
Patient ID: Brandi Stephens, female   DOB: 1946/03/18, 74 y.o.   MRN: [id number] Vital signs are stable Patient continues her IV antibiotics She notes some of her soreness is a bit better Awaiting surgical debridement to be scheduled next week by Dr. Kathyrn Sheriff

## 2019-07-19 NOTE — Progress Notes (Signed)
Physical Therapy Treatment Patient Details Name: Brandi Stephens MRN: [id number] DOB: December 20, 1945 Today's Date: 07/19/2019    History of Present Illness 74 yo returned to acute services 07/10/19 from CIR due to CT scan revealed loosening of the L2 screws and a small paraspinal abscess. 3/24 aspiration by radiology PMHx- HTN, CKD, 05/30/2019 patient underwent lumbar surgery (both anterior and posterior approaches) and was discharged home, but developed LLQ pain and severe muscle spasms. Presented to hospital 06/25/2019 with increasing low back pain radiating to the lower extremities. Discharged to CIR 06/28/19-07/10/19    PT Comments    Pt premedicated with muscle relaxer per pt request last session and was able to get up and ambulate a short distance down the hall, sit, rest and return to her room.  She is unable to tolerate sitting up OOB for any period of time, so was repositioned in bed with heating pad and pillows for comfort. Pt is hopeful for improvement and awaits news on another surgery?  PT will continue to follow acutely for safe mobility progression.   Follow Up Recommendations  Other (comment)(TBD as pain control improves and ? another back surgery)     Equipment Recommendations  None recommended by PT    Recommendations for Other Services   NA     Precautions / Restrictions Precautions Precautions: Back;Fall Precaution Comments: verbally reviewed back precautions and log roll technique Restrictions Weight Bearing Restrictions: No    Mobility  Bed Mobility Overal bed mobility: Needs Assistance Bed Mobility: Rolling;Sidelying to Sit;Sit to Sidelying Rolling: Supervision(with rail) Sidelying to sit: Min guard     Sit to sidelying: Mod assist General bed mobility comments: Min guard assist to ensure smooth transition up from side lying, mod assist to help with both legs today to return to bed.   Transfers Overall transfer level: Needs assistance Equipment used: Rolling  walker (2 wheeled) Transfers: Sit to/from Stand Sit to Stand: Min assist         General transfer comment: Min assist from lower hallway chair, min guard from elevated bed.  Pt likes to lean on RW, so had to stabilize RW from moving.   Ambulation/Gait Ambulation/Gait assistance: Min guard Gait Distance (Feet): 45 Feet(x2 with seated rest break) Assistive device: Rolling walker (2 wheeled) Gait Pattern/deviations: Step-through pattern;Decreased stride length Gait velocity: decreased Gait velocity interpretation: 1.31 - 2.62 ft/sec, indicative of limited community ambulator General Gait Details: Pt with muscle spasms during gait, reporting her right buttocks was painful today.  Seated rest before returning back to room.        Balance Overall balance assessment: Needs assistance Sitting-balance support: Feet supported;Bilateral upper extremity supported Sitting balance-Leahy Scale: Fair Sitting balance - Comments: needs bil UE prop on RW to unweight back in sitting.  Cannot tolerate sitting long, but reports she gets up to go to the bathroom and moves throughout the day.    Standing balance support: Bilateral upper extremity supported Standing balance-Leahy Scale: Poor Standing balance comment: relies on UE support on RW in standing.                             Cognition Arousal/Alertness: Awake/alert Behavior During Therapy: WFL for tasks assessed/performed Overall Cognitive Status: Within Functional Limits for tasks assessed                                 General Comments: Pt  is so positive and hopeful.              Pertinent Vitals/Pain Pain Assessment: Faces Faces Pain Scale: Hurts whole lot Pain Location: back, R hip Pain Descriptors / Indicators: Grimacing;Guarding;Discomfort;Spasm;Sharp Pain Intervention(s): Limited activity within patient's tolerance;Monitored during session;Premedicated before session;Repositioned;Heat applied            PT Goals (current goals can now be found in the care plan section) Acute Rehab PT Goals Patient Stated Goal: to be safe to go home to see her husband of 50+ years Progress towards PT goals: Progressing toward goals    Frequency    Min 5X/week      PT Plan Current plan remains appropriate       AM-PAC PT "6 Clicks" Mobility   Outcome Measure  Help needed turning from your back to your side while in a flat bed without using bedrails?: A Little Help needed moving from lying on your back to sitting on the side of a flat bed without using bedrails?: A Little Help needed moving to and from a bed to a chair (including a wheelchair)?: A Little Help needed standing up from a chair using your arms (e.g., wheelchair or bedside chair)?: A Little Help needed to walk in hospital room?: A Little Help needed climbing 3-5 steps with a railing? : A Lot 6 Click Score: 17    End of Session   Activity Tolerance: Patient limited by pain;Other (comment)(and spasms) Patient left: in bed;with call bell/phone within reach;with family/visitor present(daughter arrived at end of session. )   PT Visit Diagnosis: Unsteadiness on feet (R26.81);History of falling (Z91.81);Pain Pain - part of body: (back)     Time: 0940-1005 PT Time Calculation (min) (ACUTE ONLY): 25 min  Charges:  $Gait Training: 8-22 mins $Therapeutic Activity: 8-22 mins                    Verdene Lennert, PT, DPT  Acute Rehabilitation [phone] pager #[phone] office     07/19/2019, 10:14 AM

## 2019-07-20 ENCOUNTER — Other Ambulatory Visit: Payer: Self-pay | Admitting: Oncology

## 2019-07-20 MED ORDER — TIZANIDINE HCL 4 MG PO TABS
4.0000 mg | ORAL_TABLET | Freq: Four times a day (QID) | ORAL | Status: DC | PRN
  Administered 2019-07-20 – 2019-07-22 (×5): 4 mg via ORAL
  Filled 2019-07-20 (×6): qty 1

## 2019-07-20 NOTE — Progress Notes (Signed)
PT Cancellation Note  Patient Details Name: Brandi Stephens MRN: [id number] DOB: [birth year]   Cancelled Treatment:    Reason Eval/Treat Not Completed: Pain limiting ability to participate  Patient pre-medicated with muscle relaxers however reports she has had many more spasms this morning (including 4 in the past hour). RN made aware as pt normally pushes through her pain and is eager to work with PT.    Arby Barrette, PT Pager [phone]   Rexanne Mano 07/20/2019, 11:20 AM

## 2019-07-20 NOTE — Progress Notes (Signed)
Patient ID: Brandi Stephens, female   DOB: 19-Sep-1945, 74 y.o.   MRN: [id number] BP 139/78 (BP Location: Left Arm)   Pulse 87   Temp 98.5 F (36.9 C) (Oral)   Resp 16   Wt 115.7 kg   SpO2 92%   BMI 38.77 kg/m  Alert and oriented x 4. Speech is clear and fluent Moving lower extremities. Mild weakness in left lower extremity Will offer tizanidine also in addition to the valium, as it may work better for the muscle spasms.

## 2019-07-21 NOTE — Progress Notes (Signed)
Patient ID: Brandi Stephens, female   DOB: 1945/12/20, 74 y.o.   MRN: [id number] BP 127/66   Pulse (!) 58   Temp (!) 97.5 F (36.4 C) (Oral)   Resp 20   Wt 115.7 kg   SpO2 93%   BMI 38.77 kg/m  Alert and oriented x 4 Speech is clear and fluent Awaiting her operation

## 2019-07-21 NOTE — Plan of Care (Addendum)
Problem: Bladder/Genitourinary: Goal: Urinary functional status for postoperative course will improve Note: Pt voided 170ml for the shift, bladder scan shown to be 254ml at this time, denies having urge to void. Pt encouraged to drink more fluids and placed at bedside. Paged NRS to make then aware. Will continue to monitor urinary output.

## 2019-07-22 LAB — BASIC METABOLIC PANEL
Anion gap: 7 (ref 5–15)
BUN: 17 mg/dL (ref 8–23)
CO2: 25 mmol/L (ref 22–32)
Calcium: 9.3 mg/dL (ref 8.9–10.3)
Chloride: 102 mmol/L (ref 98–111)
Creatinine, Ser: 0.98 mg/dL (ref 0.44–1.00)
GFR calc Af Amer: >60 mL/min (ref >60)
GFR calc non Af Amer: 58 mL/min — ABNORMAL LOW (ref >60)
Glucose, Bld: 127 mg/dL — ABNORMAL HIGH (ref 70–99)
Potassium: 4.5 mmol/L (ref 3.5–5.1)
Sodium: 134 mmol/L — ABNORMAL LOW (ref 135–145)

## 2019-07-22 LAB — CBC
HCT: 28.1 % — ABNORMAL LOW (ref 36.0–46.0)
Hemoglobin: 8.9 g/dL — ABNORMAL LOW (ref 12.0–15.0)
MCH: 30.1 pg (ref 26.0–34.0)
MCHC: 31.7 g/dL (ref 30.0–36.0)
MCV: 94.9 fL (ref 80.0–100.0)
Platelets: 400 10*3/uL (ref 150–400)
RBC: 2.96 MIL/uL — ABNORMAL LOW (ref 3.87–5.11)
RDW: 14.8 % (ref 11.5–15.5)
WBC: 11.5 10*3/uL — ABNORMAL HIGH (ref 4.0–10.5)
nRBC: 0 % (ref 0.0–0.2)

## 2019-07-22 NOTE — Progress Notes (Signed)
Physical Therapy Treatment Patient Details Name: Brandi Stephens MRN: [id number] DOB: [birth year] Today's Date: 07/22/2019    History of Present Illness 74 yo returned to acute services 07/10/19 from CIR due to CT scan revealed loosening of the L2 screws and a small paraspinal abscess. 3/24 aspiration by radiology PMHx- HTN, CKD, 05/30/2019 patient underwent lumbar surgery (both anterior and posterior approaches) and was discharged home, but developed LLQ pain and severe muscle spasms. Presented to hospital 06/25/2019 with increasing low back pain radiating to the lower extremities. Discharged to CIR 06/28/19-07/10/19    PT Comments    Patient up in bathroom with nursing on arrival. Continues to be limited by severe spasms in left lower abdomen, lumbar, and hip region. Was able to incr ambulation in her room prior to return to supine. Pt reported she did her exercises with her daughters this weekend however she has difficulty recalling exercises on her own. Reviewed current plan and pt AGAIN initiated SLR exercise and had to be reminded this is NOT one of her exercises. Added hooklying unilateral hip abdct/adduction (alternating) and isometric adduction with pillow between her knees. She reports her hips felt less sore/stiff with these additional exercises.      Follow Up Recommendations  Other (comment)(TBD as pain control improves and ? another back surgery)     Equipment Recommendations  None recommended by PT    Recommendations for Other Services       Precautions / Restrictions Precautions Precautions: Back;Fall Precaution Comments: vc only for sit to sidelying to prevent twisting    Mobility  Bed Mobility Overal bed mobility: Needs Assistance Bed Mobility: Sit to Sidelying         Sit to sidelying: Min assist General bed mobility comments: put up in bathroom on arrival; recalled to reach in front of her body for sit to side and assist for raising legs due to incr  pain  Transfers Overall transfer level: Needs assistance Equipment used: Rolling walker (2 wheeled) Transfers: Sit to/from Stand Sit to Stand: Min guard         General transfer comment: from toilet with grab bar  Ambulation/Gait Ambulation/Gait assistance: Min guard Gait Distance (Feet): 60 Feet Assistive device: Rolling walker (2 wheeled) Gait Pattern/deviations: Step-through pattern;Decreased stride length Gait velocity: decreased   General Gait Details: Pt with muscle spasms during gait, therefore walked laps in room to stay close to furniture if needed to sit.    Stairs             Wheelchair Mobility    Modified Rankin (Stroke Patients Only)       Balance Overall balance assessment: Needs assistance Sitting-balance support: Feet supported;Bilateral upper extremity supported Sitting balance-Leahy Scale: Fair Sitting balance - Comments: needs bil UE prop on RW to unweight back in sitting.  Cannot tolerate sitting long, but reports she gets up to go to the bathroom and moves throughout the day.    Standing balance support: Bilateral upper extremity supported Standing balance-Leahy Scale: Poor Standing balance comment: relies on UE support on RW in standing.                             Cognition Arousal/Alertness: Awake/alert Behavior During Therapy: WFL for tasks assessed/performed Overall Cognitive Status: Within Functional Limits for tasks assessed  General Comments: Pt is so positive and hopeful.       Exercises General Exercises - Lower Extremity Ankle Circles/Pumps: AROM;Both;10 reps Quad Sets: AROM;Both;5 reps Heel Slides: AROM;Both;10 reps Hip ABduction/ADduction: AROM;Both;5 reps(in hooklying) Other Exercises Other Exercises: abdominal sets x 5 reps; pt with difficulty coordinating with her breath (tends to breath hold)    General Comments General comments (skin integrity, edema,  etc.): Pt reports NSG PA told  her surgery might be Tue, Thurs, or Friday. Dr. Kathyrn Sheriff to see pt this pm and determine plan      Pertinent Vitals/Pain Pain Assessment: Faces Faces Pain Scale: Hurts whole lot Pain Location: back, R hip Pain Descriptors / Indicators: Grimacing;Guarding;Discomfort;Spasm;Sharp Pain Intervention(s): Limited activity within patient's tolerance;Monitored during session;Repositioned    Home Living                      Prior Function            PT Goals (current goals can now be found in the care plan section) Acute Rehab PT Goals Patient Stated Goal: to be safe to go home to see her husband of 50+ years Time For Goal Achievement: 07/29/19 Potential to Achieve Goals: Good Progress towards PT goals: Progressing toward goals(less cues for back precautions required)    Frequency    Min 5X/week      PT Plan Current plan remains appropriate    Co-evaluation              AM-PAC PT "6 Clicks" Mobility   Outcome Measure  Help needed turning from your back to your side while in a flat bed without using bedrails?: A Little Help needed moving from lying on your back to sitting on the side of a flat bed without using bedrails?: A Little Help needed moving to and from a bed to a chair (including a wheelchair)?: A Little Help needed standing up from a chair using your arms (e.g., wheelchair or bedside chair)?: A Little Help needed to walk in hospital room?: A Little Help needed climbing 3-5 steps with a railing? : A Lot 6 Click Score: 17    End of Session Equipment Utilized During Treatment: Gait belt Activity Tolerance: Patient limited by pain;Other (comment)(and spasms) Patient left: in bed;with call bell/phone within reach Nurse Communication: Mobility status PT Visit Diagnosis: Unsteadiness on feet (R26.81);History of falling (Z91.81);Pain Pain - part of body: (back)     Time: [id number] PT Time Calculation (min) (ACUTE ONLY): 12  min  Charges:  $Therapeutic Exercise: 8-22 mins                      Arby Barrette, PT Pager [phone]    Rexanne Mano 07/22/2019, 12:43 PM

## 2019-07-22 NOTE — Progress Notes (Signed)
  NEUROSURGERY PROGRESS NOTE   No issues overnight.  Persistent back spasms  EXAM:  BP 128/60 (BP Location: Left Arm)   Pulse 100   Temp 98.1 F (36.7 C)   Resp 15   Wt 115.7 kg   SpO2 92%   BMI 38.77 kg/m   Awake, alert, oriented  Speech fluent, appropriate  CN grossly intact  MAEW, non-focal Incision: c/d/i  IMPRESSION/PLAN 74 y.o. female approximately 7wks s/p decompression and extension of fusion L2-S1. She has c/o severe muscle spasms and LLQ abd pain since surgery. She was readmitted to the hospital with AKI and subsequently d/c to CIR. follow-up CT of the lumbar spine has demonstrated posterior migration of the L2-3 interbody cage as well as possible screw loosening.  There was also some soft tissue inflammation suggestive of infection. CT aspiration obtained and cultures positive for Staph epidermis, on IV cefazolin x6 weeks per ID. Patient is not at a position to go home to complete abx prior to revision surgery. Given debilitating pain, will plan for revision surgery tomorrow. Pre op labs ordered.  Dr Kathyrn Sheriff to discuss further with patient and her daughter, [name].

## 2019-07-23 ENCOUNTER — Encounter (HOSPITAL_COMMUNITY): Payer: Self-pay | Admitting: Neurosurgery

## 2019-07-23 MED ORDER — LIDOCAINE 2% (20 MG/ML) 5 ML SYRINGE
INTRAMUSCULAR | Status: AC
  Filled 2019-07-23: qty 5

## 2019-07-23 MED ORDER — DEXAMETHASONE SODIUM PHOSPHATE 10 MG/ML IJ SOLN
INTRAMUSCULAR | Status: AC
  Filled 2019-07-23: qty 1

## 2019-07-23 MED ORDER — ACETAMINOPHEN 500 MG PO TABS: 1000.0000 mg | ORAL_TABLET | Freq: Once | ORAL | Status: DC

## 2019-07-23 MED ORDER — LIDOCAINE-EPINEPHRINE 1 %-1:100000 IJ SOLN
INTRAMUSCULAR | Status: AC
  Filled 2019-07-23: qty 1

## 2019-07-23 MED ORDER — ROCURONIUM BROMIDE 10 MG/ML (PF) SYRINGE
PREFILLED_SYRINGE | INTRAVENOUS | Status: AC
  Filled 2019-07-23: qty 10

## 2019-07-23 MED ORDER — THROMBIN 5000 UNITS EX SOLR
CUTANEOUS | Status: AC
  Filled 2019-07-23: qty 10000

## 2019-07-23 MED ORDER — BUPIVACAINE HCL (PF) 0.5 % IJ SOLN
INTRAMUSCULAR | Status: AC
  Filled 2019-07-23: qty 30

## 2019-07-23 MED ORDER — PROPOFOL 10 MG/ML IV BOLUS
INTRAVENOUS | Status: AC
  Filled 2019-07-23: qty 20

## 2019-07-23 MED ORDER — FENTANYL CITRATE (PF) 250 MCG/5ML IJ SOLN
INTRAMUSCULAR | Status: AC
  Filled 2019-07-23: qty 5

## 2019-07-23 MED ORDER — ONDANSETRON HCL 4 MG/2ML IJ SOLN
INTRAMUSCULAR | Status: AC
  Filled 2019-07-23: qty 2

## 2019-07-23 MED ORDER — LACTATED RINGERS IV SOLN: INTRAVENOUS | Status: DC

## 2019-07-23 NOTE — Progress Notes (Signed)
Physical Therapy Treatment Patient Details Name: Brandi Stephens MRN: [id number] DOB: 12-24-1945 Today's Date: 07/23/2019    History of Present Illness 74 yo returned to acute services 07/10/19 from CIR due to CT scan revealed loosening of the L2 screws and a small paraspinal abscess. 3/24 aspiration by radiology PMHx- HTN, CKD, 05/30/2019 patient underwent lumbar surgery (both anterior and posterior approaches) and was discharged home, but developed LLQ pain and severe muscle spasms. Presented to hospital 06/25/2019 with increasing low back pain radiating to the lower extremities. Discharged to [facility] 06/28/19-07/10/19    PT Comments    Patient scheduled for surgery later today, however was agreeable (and wanted to!) get up and walk today. Once mobilizing/walking, her spasms were stronger than on other recent days and only able to tolerate walking to bathroom and then back to bed. Patient more groggy (reports having had pain medicine) and required vc for maintaining back precautions when rolling. Discussed anticipate MD will want her up tomorrow and plan for PT to see her 4/7.     Follow Up Recommendations  Other (comment)(TBD as pain control improves and ? another back surgery)     Equipment Recommendations  None recommended by PT    Recommendations for Other Services       Precautions / Restrictions Precautions Precautions: Back;Fall Precaution Comments: vc only for sit to sidelying to prevent twisting    Mobility  Bed Mobility Overal bed mobility: Needs Assistance Bed Mobility: Sit to Sidelying Rolling: Supervision(with rail; rt/lt with vc for shoulders and legs at same time) Sidelying to sit: Min guard     Sit to sidelying: Min assist General bed mobility comments: OOB with rail wiht close guarding due to pain, bed elevated to simulate home; return to bed required incr assist to raise legs due to pain  Transfers Overall transfer level: Needs assistance Equipment used: Rolling  walker (2 wheeled) Transfers: Sit to/from Stand Sit to Stand: Min assist         General transfer comment: from bed with PT anchoring one side of RW; from toilet with grab bar  Ambulation/Gait Ambulation/Gait assistance: Min guard Gait Distance (Feet): 15 Feet(x2) Assistive device: Rolling walker (2 wheeled) Gait Pattern/deviations: Step-through pattern;Decreased stride length;Trunk flexed Gait velocity: decreased   General Gait Details: Muscle spasms increased during time up and OOB   Stairs             Wheelchair Mobility    Modified Rankin (Stroke Patients Only)       Balance Overall balance assessment: Needs assistance Sitting-balance support: Feet supported;Bilateral upper extremity supported Sitting balance-Leahy Scale: Fair Sitting balance - Comments: needs bil UE prop on RW to unweight back in sitting.  Cannot tolerate sitting long, but reports she gets up to go to the bathroom and moves throughout the day.    Standing balance support: Bilateral upper extremity supported Standing balance-Leahy Scale: Poor Standing balance comment: relies on UE support on RW in standing.                             Cognition Arousal/Alertness: Awake/alert Behavior During Therapy: WFL for tasks assessed/performed Overall Cognitive Status: Impaired/Different from baseline(appears due to medication) Area of Impairment: Attention                   Current Attention Level: Sustained(shorter sustained attention than recent days; ?meds)           General Comments: She would lose  track of what she was saying mid-sentence      Exercises      General Comments General comments (skin integrity, edema, etc.): Incr spasms with OOB to bathroom. Difficulty getting pt positioned in bed for comfort with multple positions attempted wtih multiple pillows.       Pertinent Vitals/Pain Pain Assessment: Faces Faces Pain Scale: Hurts whole lot Pain Location:  back, R hip Pain Descriptors / Indicators: Grimacing;Guarding;Discomfort;Spasm;Sharp;Moaning Pain Intervention(s): Limited activity within patient's tolerance;Monitored during session;Repositioned;Relaxation    Home Living                      Prior Function            PT Goals (current goals can now be found in the care plan section) Acute Rehab PT Goals Patient Stated Goal: to be safe to go home to see her husband of 50+ years Time For Goal Achievement: 07/29/19 Potential to Achieve Goals: Good Progress towards PT goals: Not progressing toward goals - comment    Frequency    Min 5X/week      PT Plan Current plan remains appropriate    Co-evaluation              AM-PAC PT "6 Clicks" Mobility   Outcome Measure  Help needed turning from your back to your side while in a flat bed without using bedrails?: A Little Help needed moving from lying on your back to sitting on the side of a flat bed without using bedrails?: A Little Help needed moving to and from a bed to a chair (including a wheelchair)?: A Little Help needed standing up from a chair using your arms (e.g., wheelchair or bedside chair)?: A Little Help needed to walk in hospital room?: A Little Help needed climbing 3-5 steps with a railing? : A Lot 6 Click Score: 17    End of Session Equipment Utilized During Treatment: Gait belt Activity Tolerance: Patient limited by pain;Other (comment)(and spasms) Patient left: in bed;with call bell/phone within reach Nurse Communication: Mobility status PT Visit Diagnosis: Unsteadiness on feet (R26.81);History of falling (Z91.81);Pain Pain - part of body: (back)     Time: 1059-1130 PT Time Calculation (min) (ACUTE ONLY): 31 min  Charges:  $Gait Training: 8-22 mins $Therapeutic Activity: 8-22 mins                      Arby Barrette, PT Pager [phone]    Rexanne Mano 07/23/2019, 12:17 PM

## 2019-07-23 NOTE — Progress Notes (Signed)
Dr. Ola Spurr made aware of H/H 8.9/28

## 2019-07-23 NOTE — Progress Notes (Signed)
Per OR patient has been cancelled for tonight. Patient notified. Will also contact daughter at patient's request.

## 2019-07-23 NOTE — Progress Notes (Addendum)
OT Cancellation Note  Patient Details Name: Brandi Stephens MRN: [id number] DOB: [birth year]   Cancelled Treatment:    Reason Eval/Treat Not Completed: Patient at procedure or test/ unavailable(Off the floor at surgery.) Will return as schedule allows.   [place], OTR/L Acute Rehab Pager: [phone] Office: [phone] 07/23/2019, 3:14 PM

## 2019-07-23 NOTE — Anesthesia Preprocedure Evaluation (Addendum)
Anesthesia Evaluation  Patient identified by MRN, date of birth, ID band Patient awake    Reviewed: Allergy & Precautions, H&P , NPO status , Patient's Chart, lab work & pertinent test results, reviewed documented beta blocker date and time   History of Anesthesia Complications (+) PONV and history of anesthetic complications  Airway Mallampati: II  TM Distance: >3 FB Neck ROM: Full    Dental no notable dental hx. (+) Teeth Intact, Dental Advisory Given   Pulmonary COPD,  COPD inhaler, former smoker,    Pulmonary exam normal breath sounds clear to auscultation       Cardiovascular hypertension, Pt. on home beta blockers and Pt. on medications +CHF   Rhythm:Regular Rate:Normal  ECG: SR, rate 92   Neuro/Psych Seizures -, Well Controlled,  PSYCHIATRIC DISORDERS Anxiety Depression    GI/Hepatic GERD  Medicated and Controlled,(+)     substance abuse  alcohol use,   Endo/Other  Hypothyroidism Morbid obesity  Renal/GU negative Renal ROS     Musculoskeletal  (+) Arthritis , Osteoarthritis,  narcotic dependentGout   Abdominal (+) + obese,   Peds  Hematology  (+) Blood dyscrasia, anemia ,   Anesthesia Other Findings INSTRUMENTATION FAILURE  Reproductive/Obstetrics                          Anesthesia Physical Anesthesia Plan  ASA: III  Anesthesia Plan: General   Post-op Pain Management:    Induction: Intravenous  PONV Risk Score and Plan: 4 or greater and Ondansetron, Dexamethasone, Treatment may vary due to age or medical condition and Midazolam  Airway Management Planned: Oral ETT  Additional Equipment:   Intra-op Plan:   Post-operative Plan: Extubation in OR  Informed Consent: I have reviewed the patients History and Physical, chart, labs and discussed the procedure including the risks, benefits and alternatives for the proposed anesthesia with the patient or authorized  representative who has indicated his/her understanding and acceptance.     Dental advisory given  Plan Discussed with: CRNA  Anesthesia Plan Comments: (Ketamine)       Anesthesia Quick Evaluation

## 2019-07-24 ENCOUNTER — Encounter (HOSPITAL_COMMUNITY)
Admission: AD | Disposition: A | Discharge status: Rehabilitation Facility | Payer: Self-pay | Source: Intra-hospital | Attending: Neurosurgery

## 2019-07-24 ENCOUNTER — Inpatient Hospital Stay (HOSPITAL_COMMUNITY): Payer: PPO

## 2019-07-24 ENCOUNTER — Encounter (HOSPITAL_COMMUNITY): Payer: Self-pay | Admitting: Neurosurgery

## 2019-07-24 ENCOUNTER — Inpatient Hospital Stay (HOSPITAL_COMMUNITY): Payer: PPO | Admitting: Certified Registered"

## 2019-07-24 HISTORY — PX: HARDWARE REMOVAL: SHX979

## 2019-07-24 LAB — POCT I-STAT EG7
Acid-base deficit: 1 mmol/L (ref 0.0–2.0)
Bicarbonate: 26.9 mmol/L (ref 20.0–28.0)
Calcium, Ion: 1.32 mmol/L (ref 1.15–1.40)
HCT: 31 % — ABNORMAL LOW (ref 36.0–46.0)
Hemoglobin: 10.5 g/dL — ABNORMAL LOW (ref 12.0–15.0)
O2 Saturation: 50 %
Potassium: 5.2 mmol/L — ABNORMAL HIGH (ref 3.5–5.1)
Sodium: 136 mmol/L (ref 135–145)
TCO2: 29 mmol/L (ref 22–32)
pCO2, Ven: 60.7 mmHg — ABNORMAL HIGH (ref 44.0–60.0)
pH, Ven: 7.255 (ref 7.250–7.430)
pO2, Ven: 32 mmHg (ref 32.0–45.0)

## 2019-07-24 LAB — PREPARE RBC (CROSSMATCH)

## 2019-07-24 SURGERY — REMOVAL, HARDWARE
Anesthesia: General | Site: Back

## 2019-07-24 MED ORDER — FENTANYL CITRATE (PF) 100 MCG/2ML IJ SOLN
INTRAMUSCULAR | Status: AC
  Filled 2019-07-24: qty 2

## 2019-07-24 MED ORDER — SODIUM CHLORIDE 0.9% FLUSH
3.0000 mL | INTRAVENOUS | Status: DC | PRN
  Administered 2019-07-31: 21:00:00 3 mL via INTRAVENOUS

## 2019-07-24 MED ORDER — SUGAMMADEX SODIUM 500 MG/5ML IV SOLN
INTRAVENOUS | Status: AC
  Filled 2019-07-24: qty 5

## 2019-07-24 MED ORDER — BUPIVACAINE LIPOSOME 1.3 % IJ SUSP
20.0000 mL | Freq: Once | INTRAMUSCULAR | Status: DC
  Filled 2019-07-24: qty 20

## 2019-07-24 MED ORDER — SODIUM CHLORIDE 0.9 % IV SOLN: INTRAVENOUS | Status: DC | PRN

## 2019-07-24 MED ORDER — LIDOCAINE 2% (20 MG/ML) 5 ML SYRINGE
INTRAMUSCULAR | Status: AC
  Filled 2019-07-24: qty 5

## 2019-07-24 MED ORDER — SODIUM CHLORIDE FLUSH 0.9 % IV SOLN: INTRAVENOUS | Status: DC | PRN

## 2019-07-24 MED ORDER — CEFAZOLIN SODIUM-DEXTROSE 2-3 GM-%(50ML) IV SOLR
INTRAVENOUS | Status: DC | PRN
  Administered 2019-07-24: 2 g via INTRAVENOUS

## 2019-07-24 MED ORDER — PHENOL 1.4 % MT LIQD: 1.0000 | OROMUCOSAL | Status: DC | PRN

## 2019-07-24 MED ORDER — ONDANSETRON HCL 4 MG/2ML IJ SOLN
INTRAMUSCULAR | Status: AC
  Filled 2019-07-24: qty 2

## 2019-07-24 MED ORDER — PROMETHAZINE HCL 25 MG/ML IJ SOLN
INTRAMUSCULAR | Status: AC
  Filled 2019-07-24: qty 1

## 2019-07-24 MED ORDER — THROMBIN 5000 UNITS EX SOLR
CUTANEOUS | Status: AC
  Filled 2019-07-24: qty 5000

## 2019-07-24 MED ORDER — SODIUM CHLORIDE 0.9 % IV SOLN
250.0000 mL | INTRAVENOUS | Status: DC
  Administered 2019-07-24: 23:00:00 250 mL via INTRAVENOUS

## 2019-07-24 MED ORDER — ONDANSETRON HCL 4 MG PO TABS: 4.0000 mg | ORAL_TABLET | Freq: Four times a day (QID) | ORAL | Status: DC | PRN

## 2019-07-24 MED ORDER — PROMETHAZINE HCL 25 MG/ML IJ SOLN
6.2500 mg | Freq: Once | INTRAMUSCULAR | Status: AC
  Administered 2019-07-24: 6.25 mg via INTRAVENOUS

## 2019-07-24 MED ORDER — LIDOCAINE-EPINEPHRINE 1 %-1:100000 IJ SOLN
INTRAMUSCULAR | Status: AC
  Filled 2019-07-24: qty 1

## 2019-07-24 MED ORDER — ONDANSETRON HCL 4 MG/2ML IJ SOLN
INTRAMUSCULAR | Status: DC | PRN
  Administered 2019-07-24: 4 mg via INTRAVENOUS

## 2019-07-24 MED ORDER — ALBUMIN HUMAN 5 % IV SOLN: INTRAVENOUS | Status: DC | PRN

## 2019-07-24 MED ORDER — MIDAZOLAM HCL 2 MG/2ML IJ SOLN
INTRAMUSCULAR | Status: AC
  Filled 2019-07-24: qty 2

## 2019-07-24 MED ORDER — PHENYLEPHRINE HCL (PRESSORS) 10 MG/ML IV SOLN
INTRAVENOUS | Status: AC
  Filled 2019-07-24: qty 1

## 2019-07-24 MED ORDER — PROPOFOL 10 MG/ML IV BOLUS
INTRAVENOUS | Status: DC | PRN
  Administered 2019-07-24: 200 mg via INTRAVENOUS

## 2019-07-24 MED ORDER — ACETAMINOPHEN 325 MG PO TABS: 650.0000 mg | ORAL_TABLET | ORAL | Status: DC | PRN

## 2019-07-24 MED ORDER — SUGAMMADEX SODIUM 200 MG/2ML IV SOLN
INTRAVENOUS | Status: DC | PRN
  Administered 2019-07-24: 300 mg via INTRAVENOUS

## 2019-07-24 MED ORDER — THROMBIN 5000 UNITS EX SOLR: OROMUCOSAL | Status: DC | PRN

## 2019-07-24 MED ORDER — ACETAMINOPHEN 10 MG/ML IV SOLN: 1000.0000 mg | Freq: Once | INTRAVENOUS | Status: DC | PRN

## 2019-07-24 MED ORDER — BACITRACIN ZINC 500 UNIT/GM EX OINT
TOPICAL_OINTMENT | CUTANEOUS | Status: DC | PRN
  Administered 2019-07-24: 1 via TOPICAL

## 2019-07-24 MED ORDER — THROMBIN 20000 UNITS EX SOLR
CUTANEOUS | Status: AC
  Filled 2019-07-24: qty 20000

## 2019-07-24 MED ORDER — MIDAZOLAM HCL 2 MG/2ML IJ SOLN
INTRAMUSCULAR | Status: DC | PRN
  Administered 2019-07-24: 2 mg via INTRAVENOUS

## 2019-07-24 MED ORDER — ONDANSETRON HCL 4 MG/2ML IJ SOLN
4.0000 mg | Freq: Four times a day (QID) | INTRAMUSCULAR | Status: DC | PRN
  Administered 2019-07-25 – 2019-07-31 (×7): 4 mg via INTRAVENOUS
  Filled 2019-07-24 (×8): qty 2

## 2019-07-24 MED ORDER — EPHEDRINE 5 MG/ML INJ
INTRAVENOUS | Status: AC
  Filled 2019-07-24: qty 10

## 2019-07-24 MED ORDER — 0.9 % SODIUM CHLORIDE (POUR BTL) OPTIME
TOPICAL | Status: DC | PRN
  Administered 2019-07-24: 1000 mL

## 2019-07-24 MED ORDER — BUPIVACAINE HCL (PF) 0.5 % IJ SOLN
INTRAMUSCULAR | Status: AC
  Filled 2019-07-24: qty 30

## 2019-07-24 MED ORDER — DEXAMETHASONE SODIUM PHOSPHATE 10 MG/ML IJ SOLN
INTRAMUSCULAR | Status: DC | PRN
  Administered 2019-07-24: 10 mg via INTRAVENOUS

## 2019-07-24 MED ORDER — CEFAZOLIN SODIUM 1 G IJ SOLR
INTRAMUSCULAR | Status: AC
  Filled 2019-07-24: qty 20

## 2019-07-24 MED ORDER — BACITRACIN ZINC 500 UNIT/GM EX OINT
TOPICAL_OINTMENT | CUTANEOUS | Status: AC
  Filled 2019-07-24: qty 28.35

## 2019-07-24 MED ORDER — FENTANYL CITRATE (PF) 250 MCG/5ML IJ SOLN
INTRAMUSCULAR | Status: DC | PRN
  Administered 2019-07-24: 100 ug via INTRAVENOUS
  Administered 2019-07-24: 25 ug via INTRAVENOUS
  Administered 2019-07-24: 75 ug via INTRAVENOUS

## 2019-07-24 MED ORDER — FENTANYL CITRATE (PF) 250 MCG/5ML IJ SOLN
INTRAMUSCULAR | Status: AC
  Filled 2019-07-24: qty 5

## 2019-07-24 MED ORDER — LACTATED RINGERS IV SOLN: INTRAVENOUS | Status: DC | PRN

## 2019-07-24 MED ORDER — PHENYLEPHRINE 40 MCG/ML (10ML) SYRINGE FOR IV PUSH (FOR BLOOD PRESSURE SUPPORT)
PREFILLED_SYRINGE | INTRAVENOUS | Status: DC | PRN
  Administered 2019-07-24: 80 ug via INTRAVENOUS
  Administered 2019-07-24 (×2): 120 ug via INTRAVENOUS
  Administered 2019-07-24: 160 ug via INTRAVENOUS

## 2019-07-24 MED ORDER — EPHEDRINE SULFATE-NACL 50-0.9 MG/10ML-% IV SOSY
PREFILLED_SYRINGE | INTRAVENOUS | Status: DC | PRN
  Administered 2019-07-24: 15 mg via INTRAVENOUS
  Administered 2019-07-24: 5 mg via INTRAVENOUS
  Administered 2019-07-24: 15 mg via INTRAVENOUS

## 2019-07-24 MED ORDER — ROCURONIUM BROMIDE 10 MG/ML (PF) SYRINGE
PREFILLED_SYRINGE | INTRAVENOUS | Status: DC | PRN
  Administered 2019-07-24: 30 mg via INTRAVENOUS
  Administered 2019-07-24: 20 mg via INTRAVENOUS
  Administered 2019-07-24: 30 mg via INTRAVENOUS
  Administered 2019-07-24: 50 mg via INTRAVENOUS

## 2019-07-24 MED ORDER — ROCURONIUM BROMIDE 10 MG/ML (PF) SYRINGE
PREFILLED_SYRINGE | INTRAVENOUS | Status: AC
  Filled 2019-07-24: qty 10

## 2019-07-24 MED ORDER — LIDOCAINE 2% (20 MG/ML) 5 ML SYRINGE
INTRAMUSCULAR | Status: DC | PRN
  Administered 2019-07-24: 60 mg via INTRAVENOUS

## 2019-07-24 MED ORDER — PHENYLEPHRINE 40 MCG/ML (10ML) SYRINGE FOR IV PUSH (FOR BLOOD PRESSURE SUPPORT)
PREFILLED_SYRINGE | INTRAVENOUS | Status: AC
  Filled 2019-07-24: qty 10

## 2019-07-24 MED ORDER — PHENYLEPHRINE HCL-NACL 10-0.9 MG/250ML-% IV SOLN
INTRAVENOUS | Status: DC | PRN
  Administered 2019-07-24: 50 ug/min via INTRAVENOUS

## 2019-07-24 MED ORDER — SODIUM CHLORIDE 0.9% FLUSH
3.0000 mL | Freq: Two times a day (BID) | INTRAVENOUS | Status: DC
  Administered 2019-07-24 – 2019-08-02 (×11): 3 mL via INTRAVENOUS

## 2019-07-24 MED ORDER — ONDANSETRON HCL 4 MG/2ML IJ SOLN
4.0000 mg | Freq: Once | INTRAMUSCULAR | Status: AC | PRN
  Administered 2019-07-24: 20:00:00 4 mg via INTRAVENOUS

## 2019-07-24 MED ORDER — DEXAMETHASONE SODIUM PHOSPHATE 10 MG/ML IJ SOLN
INTRAMUSCULAR | Status: AC
  Filled 2019-07-24: qty 1

## 2019-07-24 MED ORDER — SODIUM CHLORIDE 0.9% IV SOLUTION: Freq: Once | INTRAVENOUS | Status: DC

## 2019-07-24 MED ORDER — THROMBIN 20000 UNITS EX SOLR: CUTANEOUS | Status: DC | PRN

## 2019-07-24 MED ORDER — ACETAMINOPHEN 650 MG RE SUPP: 650.0000 mg | RECTAL | Status: DC | PRN

## 2019-07-24 MED ORDER — FENTANYL CITRATE (PF) 100 MCG/2ML IJ SOLN
25.0000 ug | INTRAMUSCULAR | Status: DC | PRN
  Administered 2019-07-24: 20:00:00 25 ug via INTRAVENOUS
  Administered 2019-07-24: 20:00:00 50 ug via INTRAVENOUS

## 2019-07-24 MED ORDER — MENTHOL 3 MG MT LOZG: 1.0000 | LOZENGE | OROMUCOSAL | Status: DC | PRN

## 2019-07-24 SURGICAL SUPPLY — 59 items
ADH SKN CLS APL DERMABOND .7 (GAUZE/BANDAGES/DRESSINGS) ×1
BAG DECANTER FOR FLEXI CONT (MISCELLANEOUS) ×3 IMPLANT
BAND INSRT 18 STRL LF DISP RB (MISCELLANEOUS) ×2
BAND RUBBER #18 3X1/16 STRL (MISCELLANEOUS) ×6 IMPLANT
BIT DRILL 3.5 POWEREASE (BIT) ×1 IMPLANT
BIT DRILL 3.5MM POWEREASE (BIT) ×1
BLADE SURG 11 STRL SS (BLADE) ×3 IMPLANT
BUR MATCHSTICK NEURO 3.0 LAGG (BURR) ×3 IMPLANT
CANISTER SUCT 3000ML PPV (MISCELLANEOUS) ×3 IMPLANT
CARTRIDGE OIL MAESTRO DRILL (MISCELLANEOUS) ×1 IMPLANT
CONNECTOR LAT CLSD 5.5X90 (Rod) ×4 IMPLANT
DERMABOND ADVANCED (GAUZE/BANDAGES/DRESSINGS) ×2
DERMABOND ADVANCED .7 DNX12 (GAUZE/BANDAGES/DRESSINGS) ×1 IMPLANT
DIFFUSER DRILL AIR PNEUMATIC (MISCELLANEOUS) ×3 IMPLANT
DRAPE LAPAROTOMY 100X72X124 (DRAPES) ×3 IMPLANT
DRAPE SURG 17X23 STRL (DRAPES) ×3 IMPLANT
DRSG OPSITE POSTOP 4X10 (GAUZE/BANDAGES/DRESSINGS) ×2 IMPLANT
DURAPREP 26ML APPLICATOR (WOUND CARE) ×3 IMPLANT
GAUZE 4X4 16PLY RFD (DISPOSABLE) ×2 IMPLANT
GLOVE BIO SURGEON STRL SZ 6.5 (GLOVE) ×1 IMPLANT
GLOVE BIO SURGEON STRL SZ7 (GLOVE) ×6 IMPLANT
GLOVE BIO SURGEON STRL SZ7.5 (GLOVE) ×8 IMPLANT
GLOVE BIO SURGEONS STRL SZ 6.5 (GLOVE) ×1
GLOVE BIOGEL PI IND STRL 7.0 (GLOVE) IMPLANT
GLOVE BIOGEL PI IND STRL 7.5 (GLOVE) ×2 IMPLANT
GLOVE BIOGEL PI INDICATOR 7.0 (GLOVE) ×6
GLOVE BIOGEL PI INDICATOR 7.5 (GLOVE) ×4
GLOVE ECLIPSE 7.0 STRL STRAW (GLOVE) ×7 IMPLANT
GOWN STRL REUS W/ TWL LRG LVL3 (GOWN DISPOSABLE) ×2 IMPLANT
GOWN STRL REUS W/ TWL XL LVL3 (GOWN DISPOSABLE) IMPLANT
GOWN STRL REUS W/TWL 2XL LVL3 (GOWN DISPOSABLE) IMPLANT
GOWN STRL REUS W/TWL LRG LVL3 (GOWN DISPOSABLE) ×15
GOWN STRL REUS W/TWL XL LVL3 (GOWN DISPOSABLE)
HEMOSTAT POWDER KIT SURGIFOAM (HEMOSTASIS) ×3 IMPLANT
KIT BASIN OR (CUSTOM PROCEDURE TRAY) ×3 IMPLANT
KIT TURNOVER KIT B (KITS) ×3 IMPLANT
NDL HYPO 25X1 1.5 SAFETY (NEEDLE) ×1 IMPLANT
NDL SPNL 18GX3.5 QUINCKE PK (NEEDLE) IMPLANT
NEEDLE HYPO 25X1 1.5 SAFETY (NEEDLE) ×3 IMPLANT
NEEDLE SPNL 18GX3.5 QUINCKE PK (NEEDLE) ×3 IMPLANT
NS IRRIG 1000ML POUR BTL (IV SOLUTION) ×3 IMPLANT
OIL CARTRIDGE MAESTRO DRILL (MISCELLANEOUS) ×3
PACK LAMINECTOMY NEURO (CUSTOM PROCEDURE TRAY) ×3 IMPLANT
RASP 3.0MM (RASP) ×2 IMPLANT
ROD SOLERA 110MM (Rod) ×4 IMPLANT
SCREW CONNECTOR SET (Screw) ×8 IMPLANT
SCREW SET SOLERA (Screw) ×12 IMPLANT
SCREW SET SOLERA TI (Screw) IMPLANT
SCREW SOLERA 50X6.5XMA NS SPNE (Screw) IMPLANT
SCREW SOLERA 6.5X50MM (Screw) ×12 IMPLANT
SPONGE SURGIFOAM ABS GEL 100 (HEMOSTASIS) ×2 IMPLANT
STAPLER VISISTAT 35W (STAPLE) ×2 IMPLANT
SUT VIC AB 0 CT1 18XCR BRD8 (SUTURE) ×1 IMPLANT
SUT VIC AB 0 CT1 8-18 (SUTURE) ×15
SUT VIC AB 2-0 CT1 18 (SUTURE) IMPLANT
SUT VICRYL 3-0 RB1 18 ABS (SUTURE) ×4 IMPLANT
TOWEL GREEN STERILE (TOWEL DISPOSABLE) ×3 IMPLANT
TOWEL GREEN STERILE FF (TOWEL DISPOSABLE) ×3 IMPLANT
WATER STERILE IRR 1000ML POUR (IV SOLUTION) ×3 IMPLANT

## 2019-07-24 NOTE — Transfer of Care (Signed)
Immediate Anesthesia Transfer of Care Note  Patient: Brandi Stephens  Procedure(s) Performed: REVISION OF HARDWARE Lumbar two - Sacral one. Extention of Fusion to Lumbar one (N/A Back)  Patient Location: PACU  Anesthesia Type:General  Level of Consciousness: sedated  Airway & Oxygen Therapy: Patient connected to nasal cannula oxygen  Post-op Assessment: Report given to RN and Post -op Vital signs reviewed and stable  Post vital signs: Reviewed and stable  Last Vitals:  Vitals Value Taken Time  BP 118/88 07/24/19 1955  Temp    Pulse 100 07/24/19 2002  Resp 15 07/24/19 2002  SpO2 90 % 07/24/19 2002  Vitals shown include unvalidated device data.  Last Pain:  Vitals:   07/24/19 1200  TempSrc:   PainSc: Asleep      Patients Stated Pain Goal: 3 ([id number])  Complications: No apparent anesthesia complications

## 2019-07-24 NOTE — Progress Notes (Signed)
  NEUROSURGERY PROGRESS NOTE   No issues overnight. No new complaints.  EXAM:  BP 134/75 (BP Location: Left Arm)   Pulse 90   Temp 98.3 F (36.8 C) (Axillary)   Resp 14   Wt 115.7 kg   SpO2 94%   BMI 38.77 kg/m   Awake, alert, oriented  Speech fluent, appropriate  CN grossly intact  Good strength BLE  IMPRESSION:  74 y.o. female with lumbar osteo/discitis and hardware failure  PLAN: - Rescheduled surgery for hardware revision for tomorrow pm.  I spoke with the patient and daughter regarding reason for surgery reschedule. They are understanding. We have reviewed the risks, benefits, and alternatives to the surgery. All questions today were answered.

## 2019-07-24 NOTE — Progress Notes (Signed)
OT Cancellation Note  Patient Details Name: Brandi Stephens MRN: [id number] DOB: Sep 30, 1945   Cancelled Treatment:    Reason Eval/Treat Not Completed: Patient at procedure or test/ unavailable; will follow up for OT treatment as able.  Lou Cal, OT Acute Rehabilitation Services Pager [phone] Office [phone]   Raymondo Band 07/24/2019, 3:33 PM

## 2019-07-24 NOTE — Brief Op Note (Signed)
07/24/2019  7:41 PM  PATIENT:  Brandi Stephens  74 y.o. female  PRE-OPERATIVE DIAGNOSIS:  INSTRUMENTATION FAILURE  POST-OPERATIVE DIAGNOSIS:  INSTRUMENTATION FAILURE  PROCEDURE:  Procedure(s): REVISION OF HARDWARE Lumbar two - Sacral one. Extention of Fusion to Lumbar one (N/A)  SURGEON:  Surgeon(s) and Role:    * [name], MD - Primary  PHYSICIAN ASSISTANT: Ferne Reus, PA-C  ANESTHESIA:   local and general  EBL:  800 mL   BLOOD ADMINISTERED:2 CC PRBC  DRAINS: none   LOCAL MEDICATIONS USED:  MARCAINE   , BUPIVICAINE  and OTHER exparel   SPECIMEN:  Aspirate  DISPOSITION OF SPECIMEN:  cultures, gram stain  COUNTS:  YES  TOURNIQUET:  * No tourniquets in log *  DICTATION: .Dragon Dictation  PLAN OF CARE: Admit to inpatient   PATIENT DISPOSITION:  PACU - hemodynamically stable.   Delay start of Pharmacological VTE agent (>24hrs) due to surgical blood loss or risk of bleeding: yes

## 2019-07-24 NOTE — Progress Notes (Signed)
PICC line difficult to flush. Patient needing to have IV access prior to surgery scheduled at this time. Anesthesia notified. Dr. Roanna Banning ordered to start peripheral IV for surgical case and have IV team assess PICC line afterwards. Melissa, floor RN, made aware of PICC line, stated she consult IV team when patient arrived back to floor after surgery.

## 2019-07-24 NOTE — Anesthesia Procedure Notes (Signed)
Procedure Name: Intubation Date/Time: 07/24/2019 4:32 PM Performed by: Kathryne Hitch, CRNA Pre-anesthesia Checklist: Patient identified, Emergency Drugs available, Suction available and Patient being monitored Patient Re-evaluated:Patient Re-evaluated prior to induction Oxygen Delivery Method: Circle system utilized Preoxygenation: Pre-oxygenation with 100% oxygen Induction Type: IV induction Ventilation: Mask ventilation without difficulty Laryngoscope Size: Miller and 3 Grade View: Grade I Tube type: Oral Tube size: 7.0 mm Number of attempts: 1 Airway Equipment and Method: Stylet and Oral airway Placement Confirmation: ETT inserted through vocal cords under direct vision,  positive ETCO2 and breath sounds checked- equal and bilateral Secured at: 22 cm Tube secured with: Tape Dental Injury: Teeth and Oropharynx as per pre-operative assessment

## 2019-07-25 ENCOUNTER — Ambulatory Visit: Payer: PPO | Admitting: Neurology

## 2019-07-25 LAB — BASIC METABOLIC PANEL
Anion gap: 12 (ref 5–15)
BUN: 13 mg/dL (ref 8–23)
CO2: 24 mmol/L (ref 22–32)
Calcium: 9 mg/dL (ref 8.9–10.3)
Chloride: 100 mmol/L (ref 98–111)
Creatinine, Ser: 0.82 mg/dL (ref 0.44–1.00)
GFR calc Af Amer: >60 mL/min (ref >60)
GFR calc non Af Amer: >60 mL/min (ref >60)
Glucose, Bld: 141 mg/dL — ABNORMAL HIGH (ref 70–99)
Potassium: 4.1 mmol/L (ref 3.5–5.1)
Sodium: 136 mmol/L (ref 135–145)

## 2019-07-25 LAB — BPAM RBC
Blood Product Expiration Date: 202105012359
Blood Product Expiration Date: 202105012359
ISSUE DATE / TIME: 202104071845
ISSUE DATE / TIME: 202104071845
Unit Type and Rh: 6200
Unit Type and Rh: 6200

## 2019-07-25 LAB — CBC
HCT: 30.9 % — ABNORMAL LOW (ref 36.0–46.0)
Hemoglobin: 9.6 g/dL — ABNORMAL LOW (ref 12.0–15.0)
MCH: 28.7 pg (ref 26.0–34.0)
MCHC: 31.1 g/dL (ref 30.0–36.0)
MCV: 92.2 fL (ref 80.0–100.0)
Platelets: 387 10*3/uL (ref 150–400)
RBC: 3.35 MIL/uL — ABNORMAL LOW (ref 3.87–5.11)
RDW: 16.6 % — ABNORMAL HIGH (ref 11.5–15.5)
WBC: 11.2 10*3/uL — ABNORMAL HIGH (ref 4.0–10.5)
nRBC: 0 % (ref 0.0–0.2)

## 2019-07-25 LAB — TYPE AND SCREEN
ABO/RH(D): A POS
Antibody Screen: NEGATIVE
Unit division: 0
Unit division: 0

## 2019-07-25 NOTE — Progress Notes (Signed)
Patient having significant bleeding at incision site.  Brandi Reus, PA notified.  Dressing changed and ice applied.  Will monitor closely.

## 2019-07-25 NOTE — Plan of Care (Signed)
Pt went back to surgery on 4/7 therefore goals not met.  New goals set.

## 2019-07-25 NOTE — Progress Notes (Addendum)
  NEUROSURGERY PROGRESS NOTE   No issues overnight.  Complains of appropriate back soreness Ready to start the rehab process No new N/T/W  EXAM:  BP (!) 149/70   Pulse 92   Temp 98.2 F (36.8 C) (Oral)   Resp 16   Ht 5\' 8"  (1.727 m)   Wt 115.7 kg   SpO2 95%   BMI 38.77 kg/m   Awake, alert, oriented  Speech fluent, appropriate  CN grossly intact  MAEW, nonfocal Incision: bandage in place. No active bleeding  IMPRESSION/PLAN 74 y.o. female POD#1 revision lumbar fusion. Doing well. - PT/OT today. Hopefully she is a CIR candidate  - Repeat labs this am   I have seen and examined Brandi Stephens and agree with the exam, impression, and plan as documented in the note by Brandi Reus, PA-C.  POD#1 s/p hardware revision. Does have appropriate back pain, but reports significant improvement in muscle spasms and improved LLE weakness.  - Cont therapy, likely CIR again - Hgb 9.6, can monitor symptomatically.  Brandi Lose, MD [facility]

## 2019-07-25 NOTE — Plan of Care (Signed)
  Problem: Acute Rehab OT Goals (only OT should resolve) Goal: Pt. Will Perform Lower Body Bathing Flowsheets (Taken 07/25/2019 1150) Pt Will Perform Lower Body Bathing:  with min assist  with adaptive equipment  sit to/from stand

## 2019-07-25 NOTE — Anesthesia Postprocedure Evaluation (Signed)
Anesthesia Post Note  Patient: Brandi Stephens  Procedure(s) Performed: REVISION OF HARDWARE Lumbar two - Sacral one. Extention of Fusion to Lumbar one (N/A Back)     Patient location during evaluation: PACU Anesthesia Type: General Level of consciousness: awake Pain management: pain level controlled Vital Signs Assessment: post-procedure vital signs reviewed and stable Respiratory status: spontaneous breathing Cardiovascular status: stable Postop Assessment: no headache Anesthetic complications: no    Last Vitals:  Vitals:   07/25/19 1316 07/25/19 1606  BP: (!) 171/78 (!) 156/61  Pulse: 92 93  Resp: 18 18  Temp: 37.4 C 37.2 C  SpO2: 94% 93%    Last Pain:  Vitals:   07/25/19 1606  TempSrc: Oral  PainSc:                  Hayslee Casebolt

## 2019-07-25 NOTE — Progress Notes (Signed)
Occupational Therapy Treatment and reeval post surgery Patient Details Name: Brandi Stephens MRN: [id number] DOB: 1945/07/09 Today's Date: 07/25/2019    History of present illness 74 yo returned to acute services 07/10/19 from CIR due to CT scan revealed loosening of the L2 screws and a small paraspinal abscess. 3/24 aspiration by radiology.  Pt with redo L1-S1 fusion to fix hardware on 4/7.  PMHx- HTN, CKD, 05/30/2019 patient underwent lumbar surgery (both anterior and posterior approaches) and was discharged home, but developed LLQ pain and severe muscle spasms. Presented to hospital 06/25/2019 with increasing low back pain radiating to the lower extremities. Discharged to [facility] 06/28/19-07/10/19   OT comments  Pt seen for reeval post spinal fusion redo.  Pt continues to have limitations that she was having before surgery including pain and inability to tolerate standing or sitting for more than a few minutes that limits adl function.  Pt required min assist to transfer to the toilet but is overall mod to max assist with most LE adls due to pain and intolerance for activity at this time.  Feel pt will benefit from inpatient rehab when pain is under control to attempt to reach a supervision to mod I level of functioning with adls so she can return home with her husband.    Follow Up Recommendations  CIR;Other (comment);Supervision/Assistance - 24 hour    Equipment Recommendations  None recommended by OT    Recommendations for Other Services      Precautions / Restrictions Precautions Precautions: Back;Fall Precaution Booklet Issued: No Precaution Comments: Pt required VCs on 3 occasions to avoid twisting when turning in bed. Required Braces or Orthoses: Spinal Brace Spinal Brace: Thoracolumbosacral orthotic;Applied in sitting position Restrictions Weight Bearing Restrictions: No       Mobility Bed Mobility Overal bed mobility: Needs Assistance Bed Mobility: Rolling;Sidelying to Sit;Sit to  Supine Rolling: Supervision Sidelying to sit: Min assist   Sit to supine: Min assist   General bed mobility comments: Pt required assist to get legs back into bed.  Transfers Overall transfer level: Needs assistance Equipment used: Rolling walker (2 wheeled) Transfers: Sit to/from [facility] Sit to Stand: Min assist Stand pivot transfers: Min assist       General transfer comment: Pt had to push up with both hands on the walker while therapist held walker in place.     Balance Overall balance assessment: Needs assistance Sitting-balance support: Feet supported;Bilateral upper extremity supported Sitting balance-Leahy Scale: Fair Sitting balance - Comments: needs bil UE prop on RW to unweight back in sitting.  Cannot tolerate sitting long, but reports she gets up to go to the bathroom and moves throughout the day.    Standing balance support: Bilateral upper extremity supported Standing balance-Leahy Scale: Fair Standing balance comment: relies on UE support on RW in standing.                            ADL either performed or assessed with clinical judgement   ADL Overall ADL's : Needs assistance/impaired Eating/Feeding: Set up;Bed level Eating/Feeding Details (indicate cue type and reason): Pt continues to be in so much pain she was unable to sit up long enough to feed herself. Grooming: Minimal assistance;Standing Grooming Details (indicate cue type and reason): Pt too nauseaous to tolerate standing to groom. Upper Body Bathing: Minimal assistance;Bed level Upper Body Bathing Details (indicate cue type and reason): Pt unable to tolerate sitting long enough to bathe Lower  Body Bathing: Maximal assistance;Sit to/from stand Lower Body Bathing Details (indicate cue type and reason): Pt in too much pain to attempt much LE dressing. Upper Body Dressing : Moderate assistance;Sitting   Lower Body Dressing: Maximal assistance;Sit to/from stand    Toilet Transfer: Stand-pivot;BSC;RW;Minimal assistance Toilet Transfer Details (indicate cue type and reason): min assist for steading as pt nauseaous. Toileting- Clothing Manipulation and Hygiene: Moderate assistance;Sitting/lateral lean Toileting - Clothing Manipulation Details (indicate cue type and reason): Pt has difficulty reaching to clean self due to pain.     Functional mobility during ADLs: Minimal assistance;Rolling walker General ADL Comments: Pt very limited by back pain.  Only walked a few steps.  Pt also with N/V.     Vision   Vision Assessment?: No apparent visual deficits   Perception     Praxis      Cognition Arousal/Alertness: Awake/alert Behavior During Therapy: WFL for tasks assessed/performed Overall Cognitive Status: Impaired/Different from baseline Area of Impairment: Attention                   Current Attention Level: Sustained           General Comments: She would lose track of what she was saying mid-sentence        Exercises     Shoulder Instructions       General Comments Pt with oozing dressing on back with blood and sanginous fluid.  Nursing notified and immediately changed dressing.    Pertinent Vitals/ Pain       Pain Assessment: 0-10 Pain Score: 10-Worst pain ever Pain Location: back, R hip Pain Descriptors / Indicators: Grimacing;Guarding;Discomfort;Sharp;Moaning Pain Intervention(s): Limited activity within patient's tolerance;Monitored during session;Premedicated before session;Repositioned  Home Living                                          Prior Functioning/Environment              Frequency  Min 2X/week        Progress Toward Goals  OT Goals(current goals can now be found in the care plan section)  Progress towards OT goals: Progressing toward goals  Acute Rehab OT Goals Patient Stated Goal: to be safe to go home to see her husband of 50+ years OT Goal Formulation: With  patient Time For Goal Achievement: 08/08/19 Potential to Achieve Goals: Good ADL Goals Pt Will Perform Grooming: with modified independence;standing Pt Will Perform Lower Body Bathing: with min assist;with adaptive equipment;sit to/from stand Pt Will Perform Lower Body Dressing: with min assist;with adaptive equipment;sit to/from stand Pt Will Transfer to Toilet: with supervision;ambulating Pt Will Perform Toileting - Clothing Manipulation and hygiene: with supervision;sit to/from stand Pt Will Perform Tub/Shower Transfer: Tub transfer;tub bench;with min assist Additional ADL Goal #1: Pt will perform bed mobility using log roll technique with Mod I in preparation for ADLs  Plan Discharge plan remains appropriate    Co-evaluation                 AM-PAC OT "6 Clicks" Daily Activity     Outcome Measure   Help from another person eating meals?: A Little Help from another person taking care of personal grooming?: A Little Help from another person toileting, which includes using toliet, bedpan, or urinal?: A Lot Help from another person bathing (including washing, rinsing, drying)?: A Lot Help from another person to put on and  taking off regular upper body clothing?: A Lot Help from another person to put on and taking off regular lower body clothing?: A Lot 6 Click Score: 14    End of Session Equipment Utilized During Treatment: Rolling walker  OT Visit Diagnosis: Other abnormalities of gait and mobility (R26.89);Muscle weakness (generalized) (M62.81);Pain;History of falling (Z91.81) Pain - part of body: (back)   Activity Tolerance Patient limited by pain   Patient Left in bed;with call bell/phone within reach   Nurse Communication Mobility status        Time: [id number] OT Time Calculation (min): 28 min  Charges: OT General Charges $OT Visit: 1 Visit OT Treatments $Self Care/Home Management : 23-37 mins    Glenford Peers 07/25/2019, 11:52 AM

## 2019-07-25 NOTE — Progress Notes (Signed)
Physical Therapy Treatment Patient Details Name: Brandi Stephens MRN: [id number] DOB: [birth year] Today's Date: 07/25/2019    History of Present Illness 74 yo returned to acute services 07/10/19 from CIR due to CT scan revealed loosening of the L2 screws and a small paraspinal abscess. 3/24 aspiration by radiology PMHx- HTN, CKD, 05/30/2019 patient underwent lumbar surgery (both anterior and posterior approaches) and was discharged home, but developed LLQ pain and severe muscle spasms. Presented to hospital 06/25/2019 with increasing low back pain radiating to the lower extremities. Discharged to CIR 06/28/19-07/10/19; 07/24/19 to OR for L2-S1 extension of previous fusion due to instrumentation failure    PT Comments    Patient pre-medicated for pain and groggy with slight confusion. Willing to attempt with PT, however once standing EOB she reported nausea and ultimately vomited. Did not don brace as continues with dressing over back incision oozing (RN aware). Pt returned to bed.     Follow Up Recommendations  CIR     Equipment Recommendations  None recommended by PT    Recommendations for Other Services       Precautions / Restrictions Precautions Precautions: Back;Fall Precaution Booklet Issued: No Precaution Comments: vc for sit to sidelying to prevent twisting Required Braces or Orthoses: Spinal Brace Spinal Brace: Lumbar corset;Applied in sitting position    Mobility  Bed Mobility Overal bed mobility: Needs Assistance Bed Mobility: Sit to Sidelying;Rolling;Sidelying to Sit Rolling: Supervision(with rail; rt/lt with vc for shoulders and legs at same time) Sidelying to sit: Min guard(with rail)     Sit to sidelying: Min assist General bed mobility comments: pt required assist and cues to transition from sit to SIDE then supine  Transfers Overall transfer level: Needs assistance Equipment used: Rolling walker (2 wheeled) Transfers: Sit to/from Stand Sit to Stand: Min assist          General transfer comment: from bed x 2 for changing bed linens due to continued bleeding from incision/dressing. RN aware  Ambulation/Gait             General Gait Details: Pt felt too nauseated (and ultimately did vomit)   Stairs             Wheelchair Mobility    Modified Rankin (Stroke Patients Only)       Balance Overall balance assessment: Needs assistance Sitting-balance support: Feet supported;Bilateral upper extremity supported Sitting balance-Leahy Scale: Fair Sitting balance - Comments: needs bil UE prop on RW to unweight back in sitting.  Cannot tolerate sitting long   Standing balance support: Bilateral upper extremity supported Standing balance-Leahy Scale: Poor Standing balance comment: relies on UE support on RW in standing.                             Cognition Arousal/Alertness: Lethargic;Suspect due to medications Behavior During Therapy: [facility] for tasks assessed/performed Overall Cognitive Status: Impaired/Different from baseline(appears due to medication) Area of Impairment: Attention                   Current Attention Level: Sustained           General Comments: She would lose track of what she was saying mid-sentence      Exercises      General Comments General comments (skin integrity, edema, etc.): Back dressing continues to ooze drainage (serosanguinous). RN aware      Pertinent Vitals/Pain Pain Assessment: 0-10 Pain Score: 5  Pain Location: back Pain Descriptors /  Indicators: Grimacing;Guarding;Discomfort;Sharp Pain Intervention(s): Limited activity within patient's tolerance;Monitored during session;Premedicated before session;Repositioned;Ice applied    Home Living                      Prior Function            PT Goals (current goals can now be found in the care plan section) Acute Rehab PT Goals Patient Stated Goal: to be safe to go home to see her husband of 50+ years PT  Goal Formulation: With patient Time For Goal Achievement: 08/08/19 Potential to Achieve Goals: Good Progress towards PT goals: (Goals remain appropriate s/p surgery)    Frequency    Min 5X/week      PT Plan Current plan remains appropriate    Co-evaluation              AM-PAC PT "6 Clicks" Mobility   Outcome Measure  Help needed turning from your back to your side while in a flat bed without using bedrails?: A Little Help needed moving from lying on your back to sitting on the side of a flat bed without using bedrails?: A Little Help needed moving to and from a bed to a chair (including a wheelchair)?: A Little Help needed standing up from a chair using your arms (e.g., wheelchair or bedside chair)?: A Little Help needed to walk in hospital room?: A Lot Help needed climbing 3-5 steps with a railing? : Total 6 Click Score: 15    End of Session Equipment Utilized During Treatment: (pt refused brace due to bleeding from dressing) Activity Tolerance: Treatment limited secondary to medical complications (Comment)(N/V) Patient left: in bed;with call bell/phone within reach Nurse Communication: Mobility status PT Visit Diagnosis: Unsteadiness on feet (R26.81);History of falling (Z91.81);Pain Pain - part of body: (back)     Time: 1315-1350 PT Time Calculation (min) (ACUTE ONLY): 35 min  Charges:  $Therapeutic Activity: 23-37 mins                      Arby Barrette, PT Pager [phone]    Brandi Stephens 07/25/2019, 4:50 PM

## 2019-07-26 ENCOUNTER — Encounter: Payer: Self-pay | Admitting: *Deleted

## 2019-07-26 NOTE — Progress Notes (Signed)
Physical Therapy Treatment Patient Details Name: Brandi Stephens MRN: [id number] DOB: 11/10/1945 Today's Date: 07/26/2019    History of Present Illness 74 yo returned to acute services 07/10/19 from CIR (3/12/-07/10/19 on CIR) due to CT scan revealed loosening of the L2 screws and a small paraspinal abscess. 3/24 aspiration by radiology.  07/24/19 to OR for L2-S1 extension of previous fusion due to instrumentation failure. Most recent lumbar fusion was 05/30/19 (L2/3) with d/c home the next day and re-admission to the hospital on 06/24/19 for AKI and back pain/spasm for which she d/c to CIR 3/12-3/24/21.  PMH-L RTC repair, multiple lumbar spine surgeries, R knee arthroscopy, cervical spine surgery, tremor, RLS, prediabetic, neuropathy, HTN, gout, COPD, CKD, diastolic CHF, anemia.    PT Comments    Focus of this session was finding a position of comfort for pt and educating pt and her daughter re: back precautions, appropriate positioning, and brace use.  Ms. Bergland did not feel up for OOB or even EOB activity at this time due to "unbelieveable pain" in her back.  She did report the spasms that she was having before surgery are better and less intense.  She is agreeable for PT to check back tomorrow for attempts at ambulation and OOB mobility.  She does better if pre medicated before we come to see her.  PT will continue to follow acutely for safe mobility progression.   Follow Up Recommendations  CIR     Equipment Recommendations  None recommended by PT    Recommendations for Other Services   NA     Precautions / Restrictions Precautions Precautions: Back;Fall Precaution Booklet Issued: Yes (comment) Precaution Comments: educated pt and daughter and brought handout of back precautions to them.  Required Braces or Orthoses: Spinal Brace Spinal Brace: Lumbar corset;Applied in sitting position    Mobility  Bed Mobility Overal bed mobility: Needs Assistance Bed Mobility: Rolling Rolling:  Supervision         General bed mobility comments: Supervision for safety, cues to bend knees and not twist, reliance on rails for support.             Cognition Arousal/Alertness: Awake/alert Behavior During Therapy: WFL for tasks assessed/performed Overall Cognitive Status: Within Functional Limits for tasks assessed                                 General Comments: no obvious signs of cognitive deficits today, but not specifically tested.  Very poor recall of precautions and how they relate to her positioning/movement.       Exercises      General Comments General comments (skin integrity, edema, etc.): Most time spent today repositioning pt for comfort in left sidelying with pillows behind her and a pillow between her knees. When I walked in she was twisted with her knees rotated one way and her trunk the other way. I spent some time reviewing the back precaution handout with pt and her daughter and answered questions about what, if any exercises she should be doing (ankle pumps, glute squeezes, TA activation ok, but otherwise main goal to mobilize and walk).        Pertinent Vitals/Pain Pain Assessment: Faces Faces Pain Scale: Hurts whole lot Pain Location: back Pain Descriptors / Indicators: Grimacing;Guarding;Discomfort;Sharp Pain Intervention(s): Limited activity within patient's tolerance;Monitored during session;Premedicated before session;Repositioned;Ice applied           PT Goals (current goals can now  be found in the care plan section) Acute Rehab PT Goals Patient Stated Goal: to be safe to go home to see her husband of 50+ years Progress towards PT goals: Not progressing toward goals - comment(limited by pain)    Frequency    Min 5X/week      PT Plan Current plan remains appropriate       AM-PAC PT "6 Clicks" Mobility   Outcome Measure  Help needed turning from your back to your side while in a flat bed without using bedrails?: A  Little Help needed moving from lying on your back to sitting on the side of a flat bed without using bedrails?: A Little Help needed moving to and from a bed to a chair (including a wheelchair)?: A Little Help needed standing up from a chair using your arms (e.g., wheelchair or bedside chair)?: A Little Help needed to walk in hospital room?: A Lot Help needed climbing 3-5 steps with a railing? : Total 6 Click Score: 15    End of Session   Activity Tolerance: Patient limited by pain Patient left: in bed;with call bell/phone within reach;with family/visitor present   PT Visit Diagnosis: Unsteadiness on feet (R26.81);History of falling (Z91.81);Pain Pain - Right/Left: (lower) Pain - part of body: (back)     Time: [id number] PT Time Calculation (min) (ACUTE ONLY): 17 min  Charges:  $Self Care/Home Management: [place], PT, DPT  Acute Rehabilitation [phone] pager #[phone] office     07/26/2019, 3:01 PM

## 2019-07-26 NOTE — Progress Notes (Signed)
  NEUROSURGERY PROGRESS NOTE   Nausea yesterday inhibited therapy. Improved today Complains of appropriate back soreness Muscle spasms significant improvement No new N/T/W  EXAM:  BP (!) 144/83 (BP Location: Left Wrist)   Pulse (!) 103   Temp 98.6 F (37 C) (Oral)   Resp 18   Ht 5\' 8"  (1.727 m)   Wt 115.7 kg   SpO2 (!) 85%   BMI 38.77 kg/m   Awake, alert, oriented  Speech fluent, appropriate  CN grossly intact  MAEW Incision: bandage in place. Clean and dry  IMPRESSION/PLAN 74 y.o. female POD#2 revision lumbar hardware. Doing well. - PT/OT rec CIR. Will Consult - continue supportive care - change bandage as necessary

## 2019-07-26 NOTE — Plan of Care (Signed)
  Problem: Education: Goal: Ability to verbalize activity precautions or restrictions will improve Outcome: Progressing Goal: Knowledge of the prescribed therapeutic regimen will improve Outcome: Progressing   Problem: Activity: Goal: Ability to avoid complications of mobility impairment will improve Outcome: Progressing Goal: Ability to tolerate increased activity will improve Outcome: Progressing Goal: Will remain free from falls Outcome: Progressing   Problem: Clinical Measurements: Goal: Ability to maintain clinical measurements within normal limits will improve Outcome: Progressing

## 2019-07-26 NOTE — Op Note (Addendum)
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 [name], MD  ASSISTANT:  Ferne Reus, PA-C  ANESTHESIA: General Endotracheal  EBL: 800cc  SPECIMENS: Culture swabs from subfacial space  DRAINS: None  COMPLICATIONS: None immediate  CONDITION: Hemodynamically stable to PACU  HISTORY: Brandi Stephens is a 74 y.o. female  Who underwent extension of fusion to the adjacent L2-3 level approximately 6-8 weeks ago.  Unfortunately, she was readmitted with continued back pain and muscle spasms, and imaging suggested the presence of a surgical site infection.  She did undergo CT-guided biopsy confirming the presence of lumbar wound infection.  Her CT also demonstrated loosening of the L2 screws, and  Also demonstrated progressive posterior displacement of the L2-3 interbody cage on the left.  With her clinical course and the loose screws and malpositioned cage, revision of the hardware was indicated.  The risks, benefits, and alternatives to surgical revision were reviewed in detail with the patient and her daughter.  After all their questions were answered informed consent was obtained and witnessed.   PROCEDURE IN DETAIL: The patient was brought to the operating room. After induction of general anesthesia, the patient was positioned on the operative table in the prone position. All pressure points were meticulously padded. Previous skin incision was then marked out and prepped and draped in the usual sterile fashion.   after time-out was conducted, the previous incision was opened sharply.  Bovie was then used to dissect  through the subcutaneous tissue and underlying paraspinal musculature was identified and divided roughly in the midline.  The spinous process at L1  Was then identified, and dissection was carried laterally to identify the cortical trajectory  L2 screws.  Further dissection laterally was  Carried out to dissect out the remainder of the previously placed hardware down to approximately L5-S1 level.  During this dissection, I did note multiple pockets of purulent material within the paraspinal musculature which was irrigated after culture swabs were taken.    At this point, rather than removing the previously placed rod, I elected to cut the previous rod just above the L3 pedicle screws in order to remove the L2 screws.  This was done with the metal cutting bur.  Once the rods were cut, the L2 screws were easily removed from the bone, and noted to have essentially no purchase.  I was able to remove them with bayonetted pickups.  At this point, I decided to place standard trajectory pedicle screws in L2 as I did not feel that a larger screws in the same trajectory would provide any significant purchase.  Using a combination of standard anatomic landmarks and  Lateral fluoroscopy, standard trajectory pedicle screw  holes were placed in L2 and tapped to 5.5 x 50 mm.  6.5 x 50 mm pedicle screws were then placed bilaterally at L2.  While there was some purchase within the bone, I did not feel that it was adequate enough to  Be the sole fixation at the top of the construct extending down to S1.   I therefore carried subperiosteal dissection along the L1 lamina further laterally to identify the facet complex and transverse process of L1.  Again, using  standard anatomic landmarks and the assistance of lateral fluoroscopy, pilot holes for bilateral standard trajectory L1 pedicle screws were created.  These were then probed and no pedicle breaches were identified.  6.5 by 50 mm screws were then placed in L1.    I then placed  a prebent lordotic rod into the set screws, with a lateral offset connector connected to the previous rod at approximately the L4-5 level.  The offset connector was provisionally tightened to the previous rod.  A new rod was then passed through the offset connector and into the L1 and L2 tulips.  Set screws were then placed.  The offset connector set screws were then final tightened, as were the set screws on the L1 and L2 pedicle screws.  Final AP and lateral fluoroscopic images were taken and confirmed good position of the newly implanted hardware.    At this point the wound was irrigated again with copious amounts of normal saline irrigation.  The paraspinal musculature was then reapproximated with interrupted 0 Vicryl stitches.  The fascia was closed with multiple interrupted 0 Vicryl stitches, and subcutaneous space was closed with interrupted 0 Vicryl stitches.  The skin was closed with standard surgical skin staples.  Bacitracin ointment and sterile dressing was then applied.  At the end of the case all sponge, needle, instrument, and cottonoid counts were correct.  The patient was then transferred to the stretcher, extubated, and taken to the postanesthesia care unit in stable hemodynamic condition.

## 2019-07-27 ENCOUNTER — Other Ambulatory Visit: Payer: Self-pay

## 2019-07-27 MED ORDER — ENOXAPARIN SODIUM 40 MG/0.4ML ~~LOC~~ SOLN
40.0000 mg | SUBCUTANEOUS | Status: DC
  Administered 2019-07-27 – 2019-08-02 (×7): 40 mg via SUBCUTANEOUS
  Filled 2019-07-27 (×7): qty 0.4

## 2019-07-27 NOTE — Progress Notes (Signed)
Subjective: Patient reports no events o/n  Objective: Vital signs in last 24 hours: Temp:  [98.1 F (36.7 C)-98.9 F (37.2 C)] 98.5 F (36.9 C) (04/10 0820) Pulse Rate:  [84-98] 98 (04/10 0820) Resp:  [18-20] 20 (04/10 0820) BP: (121-168)/(75-87) 148/82 (04/10 0820) SpO2:  [93 %-95 %] 93 % (04/10 0820)  Intake/Output from previous day: 04/09 0701 - 04/10 0700 In: 720 [P.O.:120; IV Piggyback:600] Out: -  Intake/Output this shift: No intake/output data recorded.  Obese, a+Ox3. FC x 4.  Full strength in legs Incision c/d.  Dressing removed  Lab Results: Recent Labs    07/24/19 1915 07/25/19 1039  WBC  --  11.2*  HGB 10.5* 9.6*  HCT 31.0* 30.9*  PLT  --  387   BMET Recent Labs    07/24/19 1915 07/25/19 1039  NA 136 136  K 5.2* 4.1  CL  --  100  CO2  --  24  GLUCOSE  --  141*  BUN  --  13  CREATININE  --  0.82  CALCIUM  --  9.0    Studies/Results: No results found.  Assessment/Plan: S/p washout, revision of lumbar PSIF - awaiting culture speciation - cont ancef - likely SNF   Vallarie Mare 07/27/2019, 11:34 AM

## 2019-07-27 NOTE — Plan of Care (Signed)

## 2019-07-27 NOTE — Progress Notes (Signed)
Physical Therapy Treatment Patient Details Name: Brandi Stephens MRN: [id number] DOB: 08/15/1945 Today's Date: 07/27/2019    History of Present Illness 74 yo returned to acute services 07/10/19 from CIR (3/12/-07/10/19 on CIR) due to CT scan revealed loosening of the L2 screws and a small paraspinal abscess. 3/24 aspiration by radiology.  07/24/19 to OR for L2-S1 extension of previous fusion due to instrumentation failure. Most recent lumbar fusion was 05/30/19 (L2/3) with d/c home the next day and re-admission to the hospital on 06/24/19 for AKI and back pain/spasm for which she d/c to CIR 3/12-3/24/21.  PMH-L RTC repair, multiple lumbar spine surgeries, R knee arthroscopy, cervical spine surgery, tremor, RLS, prediabetic, neuropathy, HTN, gout, COPD, CKD, diastolic CHF, anemia.    PT Comments    Pt supine in bed on arrival this session.  She reports she is painful but will try.  Pt required assistance to move to edge of bed and stand.  Currently required min to moderate assistance and only able to take steps along the edge of bed to position for back to bed.  Once in bed required assistance to position for comfort and cold pack applied to R hip for pain management.  Will continue to recommend aggressive CIR therapies before returning home.     Follow Up Recommendations  CIR     Equipment Recommendations  None recommended by PT    Recommendations for Other Services Rehab consult     Precautions / Restrictions Precautions Precautions: Back;Fall Precaution Comments: educated pt and daughter and brought handout of back precautions to them.  Required Braces or Orthoses: Spinal Brace Spinal Brace: Lumbar corset;Applied in sitting position Restrictions Weight Bearing Restrictions: No    Mobility  Bed Mobility Overal bed mobility: Needs Assistance Bed Mobility: Rolling Rolling: Mod assist Sidelying to sit: Min assist     Sit to sidelying: Mod assist General bed mobility comments: Pt  required assistance to log roll.  Once in sidelying patient required min assistance to elevate trunk into a seated position.  Pt required lift assistance to move LEs back to bed against gravity.  Transfers Overall transfer level: Needs assistance Equipment used: Rolling walker (2 wheeled) Transfers: Sit to/from Stand Sit to Stand: Min assist Stand pivot transfers: Min assist       General transfer comment: Pt required assistance to move to edge of bed and cues for hand placement to and from seated surface.  Pt required application of brace via total assistance pre transfer although patient able to verbalize steps to donn brace but in too much pain to donn today.  Ambulation/Gait Ambulation/Gait assistance: Min guard Gait Distance (Feet): 4 Feet(side steps edge of bed.)   Gait Pattern/deviations: Decreased stride length;Trunk flexed Gait velocity: decreased   General Gait Details: Pt limited to side steps to the R along edge of bed.  Pt reports too much pain to progress gt distance forward.   Stairs             Wheelchair Mobility    Modified Rankin (Stroke Patients Only)       Balance Overall balance assessment: Needs assistance   Sitting balance-Leahy Scale: Fair       Standing balance-Leahy Scale: Poor                              Cognition Arousal/Alertness: Awake/alert Behavior During Therapy: WFL for tasks assessed/performed Overall Cognitive Status: Within Functional Limits for tasks assessed Area of  Impairment: Attention                   Current Attention Level: Sustained           General Comments: no obvious signs of cognitive deficits today, but not specifically tested.  Very poor recall of precautions and how they relate to her positioning/movement.       Exercises      General Comments        Pertinent Vitals/Pain Pain Assessment: Faces Faces Pain Scale: Hurts even more Pain Location: back Pain Descriptors /  Indicators: Grimacing;Guarding;Discomfort;Sharp Pain Intervention(s): Monitored during session;Repositioned    Home Living                      Prior Function            PT Goals (current goals can now be found in the care plan section) Acute Rehab PT Goals Patient Stated Goal: to be safe to go home to see her husband of 50+ years Potential to Achieve Goals: Good Progress towards PT goals: Progressing toward goals    Frequency    Min 5X/week      PT Plan Current plan remains appropriate    Co-evaluation              AM-PAC PT "6 Clicks" Mobility   Outcome Measure  Help needed turning from your back to your side while in a flat bed without using bedrails?: A Little Help needed moving from lying on your back to sitting on the side of a flat bed without using bedrails?: A Lot Help needed moving to and from a bed to a chair (including a wheelchair)?: A Lot Help needed standing up from a chair using your arms (e.g., wheelchair or bedside chair)?: A Little Help needed to walk in hospital room?: A Little Help needed climbing 3-5 steps with a railing? : A Lot 6 Click Score: 15    End of Session Equipment Utilized During Treatment: Gait belt Activity Tolerance: Patient limited by pain Patient left: in bed;with call bell/phone within reach;with family/visitor present Nurse Communication: Mobility status PT Visit Diagnosis: Unsteadiness on feet (R26.81);History of falling (Z91.81);Pain Pain - Right/Left: (lower)     Time: [id number] PT Time Calculation (min) (ACUTE ONLY): 23 min  Charges:  $Therapeutic Activity: 23-37 mins                     Erasmo Leventhal , PTA Acute Rehabilitation Services Pager [phone] Office [phone]     Tawn Fitzner Eli Hose 07/27/2019, 12:53 PM

## 2019-07-28 NOTE — Progress Notes (Signed)
Subjective: Patient reports soreness in back, improving LE strength  Objective: Vital signs in last 24 hours: Temp:  [98.5 F (36.9 C)-99.3 F (37.4 C)] 98.5 F (36.9 C) (04/11 0802) Pulse Rate:  [68-100] 83 (04/11 0802) Resp:  [20-21] 21 (04/11 0802) BP: (91-157)/(55-94) 115/62 (04/11 0802) SpO2:  [85 %-94 %] 85 % (04/11 0802)  Intake/Output from previous day: 04/10 0701 - 04/11 0700 In: 240 [P.O.:240] Out: -  Intake/Output this shift: No intake/output data recorded.  Incision c/d FC x 4, full strength in legs  Lab Results: No results for input(s): WBC, HGB, HCT, PLT in the last 72 hours. BMET No results for input(s): NA, K, CL, CO2, GLUCOSE, BUN, CREATININE, CALCIUM in the last 72 hours.  Studies/Results: No results found.  Assessment/Plan: S/p lumbar fusion revision, washout - continue Ancef; NGTD from cultures - awaiting dispo   Vallarie Mare 07/28/2019, 11:45 AM

## 2019-07-29 LAB — AEROBIC/ANAEROBIC CULTURE W GRAM STAIN (SURGICAL/DEEP WOUND): Culture: NO GROWTH

## 2019-07-29 LAB — BASIC METABOLIC PANEL
Anion gap: 8 (ref 5–15)
BUN: 14 mg/dL (ref 8–23)
CO2: 27 mmol/L (ref 22–32)
Calcium: 9.2 mg/dL (ref 8.9–10.3)
Chloride: 101 mmol/L (ref 98–111)
Creatinine, Ser: 0.75 mg/dL (ref 0.44–1.00)
GFR calc Af Amer: >60 mL/min (ref >60)
GFR calc non Af Amer: >60 mL/min (ref >60)
Glucose, Bld: 125 mg/dL — ABNORMAL HIGH (ref 70–99)
Potassium: 3.8 mmol/L (ref 3.5–5.1)
Sodium: 136 mmol/L (ref 135–145)

## 2019-07-29 LAB — CBC
HCT: 29.9 % — ABNORMAL LOW (ref 36.0–46.0)
Hemoglobin: 9.1 g/dL — ABNORMAL LOW (ref 12.0–15.0)
MCH: 28.4 pg (ref 26.0–34.0)
MCHC: 30.4 g/dL (ref 30.0–36.0)
MCV: 93.4 fL (ref 80.0–100.0)
Platelets: 301 10*3/uL (ref 150–400)
RBC: 3.2 MIL/uL — ABNORMAL LOW (ref 3.87–5.11)
RDW: 15.7 % — ABNORMAL HIGH (ref 11.5–15.5)
WBC: 10.1 10*3/uL (ref 4.0–10.5)
nRBC: 0 % (ref 0.0–0.2)

## 2019-07-29 MED ORDER — ALTEPLASE 2 MG IJ SOLR
2.0000 mg | Freq: Once | INTRAMUSCULAR | Status: AC
  Administered 2019-07-29: 17:00:00 2 mg
  Filled 2019-07-29: qty 2

## 2019-07-29 NOTE — Plan of Care (Signed)
  Problem: Education: Goal: Ability to verbalize activity precautions or restrictions will improve Outcome: Progressing   Problem: Education: Goal: Knowledge of the prescribed therapeutic regimen will improve Outcome: Progressing   Problem: Activity: Goal: Ability to avoid complications of mobility impairment will improve Outcome: Progressing   Problem: Clinical Measurements: Goal: Ability to maintain clinical measurements within normal limits will improve Outcome: Progressing   Problem: Clinical Measurements: Goal: Postoperative complications will be avoided or minimized Outcome: Progressing   Problem: Pain Management: Goal: Pain level will decrease Outcome: Progressing   Problem: Skin Integrity: Goal: Will show signs of wound healing Outcome: Progressing

## 2019-07-29 NOTE — Progress Notes (Signed)
Inpatient Rehab Admissions:  Inpatient Rehab Consult received.  I met with patient at the bedside for rehabilitation assessment and to discuss goals and expectations of an inpatient rehab admission.  Her pain, which has been a limiting factor for several weeks now, has improved significantly over the weekend.  Has worked with OT, but not PT yet, today and reports feeling great about her progress.  Will need insurance authorization to pursue CIR, pending bed availability.   Signed:  , PT, DPT Admissions Coordinator [phone] 07/29/19  2:30 PM    

## 2019-07-29 NOTE — Progress Notes (Signed)
Occupational Therapy Treatment Patient Details Name: Brandi Stephens MRN: [id number] DOB: 04/24/1945 Today's Date: 07/29/2019    History of present illness 74 yo returned to acute services 07/10/19 from CIR (3/12/-07/10/19 on CIR) due to CT scan revealed loosening of the L2 screws and a small paraspinal abscess. 3/24 aspiration by radiology.  07/24/19 to OR for L2-S1 extension of previous fusion due to instrumentation failure. Most recent lumbar fusion was 05/30/19 (L2/3) with d/c home the next day and re-admission to the hospital on 06/24/19 for AKI and back pain/spasm for which she d/c to CIR 3/12-3/24/21.  PMH-L RTC repair, multiple lumbar spine surgeries, R knee arthroscopy, cervical spine surgery, tremor, RLS, prediabetic, neuropathy, HTN, gout, COPD, CKD, diastolic CHF, anemia.   OT comments  Pt progressing towards OT goals, presents supine in bed pleasant and motivated to work with therapies. Pt with improved pain levels today and was able to tolerate room level mobility using RW, completing with minA and min cues for safe use of RW. Pt engaging in seated grooming ADL tasks while seated at sink in bathroom with setup/supervision assist. Discussed/educated in safety and compensatory techniques for use during grooming ADL given back precautions. Pt with increase in pain with prolonged OOB activity but overall tolerating well and remains motivated to return to her PLOF. Feel she remains an excellent candidate for CIR level therapies at time of discharge. Will continue to follow while acutely admitted.    Follow Up Recommendations  CIR;Other (comment);Supervision/Assistance - 24 hour    Equipment Recommendations  None recommended by OT          Precautions / Restrictions Precautions Precautions: Back;Fall Precaution Comments: reviewed precautions during session Required Braces or Orthoses: Spinal Brace Spinal Brace: Lumbar corset;Applied in sitting position Restrictions Weight Bearing  Restrictions: No       Mobility Bed Mobility Overal bed mobility: Needs Assistance Bed Mobility: Rolling;Sidelying to Sit;Sit to Sidelying Rolling: Min assist Sidelying to sit: Min assist     Sit to sidelying: Mod assist General bed mobility comments: min VCs for carryover of log roll technique; assist for LEs onto EOB when returning to sidelying  Transfers Overall transfer level: Needs assistance Equipment used: Rolling walker (2 wheeled) Transfers: Sit to/from Stand Sit to Stand: Min assist         General transfer comment: pt tending to pull up on RW, assist to rise and steady at RW. Stood from EOB and from [facility] in front of sink    Balance Overall balance assessment: Needs assistance Sitting-balance support: Feet supported;Bilateral upper extremity supported Sitting balance-Leahy Scale: Fair Sitting balance - Comments: typically reliant on UE support for increased stability, use of RW in front of pt today while seated on EOB   Standing balance support: Bilateral upper extremity supported Standing balance-Leahy Scale: Poor Standing balance comment: reliant on UE support/external assist                           ADL either performed or assessed with clinical judgement   ADL Overall ADL's : Needs assistance/impaired     Grooming: Set up;Supervision/safety;Sitting Grooming Details (indicate cue type and reason): seated on BSC at sink in bathroom         Upper Body Dressing : Minimal assistance;Sitting Upper Body Dressing Details (indicate cue type and reason): for donning/doffing lumbar brace Lower Body Dressing: Maximal assistance;Sit to/from stand Lower Body Dressing Details (indicate cue type and reason): requires assist to don socks at  bed level today             Functional mobility during ADLs: Minimal assistance;Rolling walker                         Cognition Arousal/Alertness: Awake/alert Behavior During Therapy: WFL for tasks  assessed/performed Overall Cognitive Status: Impaired/Different from baseline Area of Impairment: Memory                     Memory: Decreased short-term memory         General Comments: pt endorses feeling not quite herself regarding cognitive status, overall pleasant, following commands and with good awareness of precautions today         Exercises     Shoulder Instructions       General Comments incision clean and intact    Pertinent Vitals/ Pain       Pain Assessment: 0-10 Pain Score: 6 (at rest, increased to 7/10 with activity) Pain Location: back Pain Descriptors / Indicators: Grimacing;Guarding;Discomfort;Sharp Pain Intervention(s): Limited activity within patient's tolerance;Monitored during session;Premedicated before session  Home Living                                          Prior Functioning/Environment              Frequency  Min 2X/week        Progress Toward Goals  OT Goals(current goals can now be found in the care plan section)  Progress towards OT goals: Progressing toward goals  Acute Rehab OT Goals Patient Stated Goal: to be safe to go home to see her husband of 50+ years OT Goal Formulation: With patient Time For Goal Achievement: 08/08/19 Potential to Achieve Goals: Good ADL Goals Pt Will Perform Grooming: with modified independence;standing Pt Will Perform Lower Body Bathing: with min assist;with adaptive equipment;sit to/from stand Pt Will Perform Lower Body Dressing: with min assist;with adaptive equipment;sit to/from stand Pt Will Transfer to Toilet: with supervision;ambulating Pt Will Perform Toileting - Clothing Manipulation and hygiene: with supervision;sit to/from stand Pt Will Perform Tub/Shower Transfer: Tub transfer;tub bench;with min assist Additional ADL Goal #1: Pt will perform bed mobility using log roll technique with Mod I in preparation for ADLs  Plan Discharge plan remains appropriate     Co-evaluation                 AM-PAC OT "6 Clicks" Daily Activity     Outcome Measure   Help from another person eating meals?: A Little Help from another person taking care of personal grooming?: A Little Help from another person toileting, which includes using toliet, bedpan, or urinal?: A Lot Help from another person bathing (including washing, rinsing, drying)?: A Lot Help from another person to put on and taking off regular upper body clothing?: A Lot Help from another person to put on and taking off regular lower body clothing?: A Lot 6 Click Score: 14    End of Session Equipment Utilized During Treatment: Rolling walker;Gait belt;Back brace  OT Visit Diagnosis: Other abnormalities of gait and mobility (R26.89);Muscle weakness (generalized) (M62.81);Pain;History of falling (Z91.81) Pain - part of body: (back)   Activity Tolerance Patient tolerated treatment well   Patient Left in bed;with call bell/phone within reach;with bed alarm set;with family/visitor present   Nurse Communication Mobility status  [id number] OT Time Calculation (min): 35 min  Charges: OT General Charges $OT Visit: 1 Visit OT Treatments $Self Care/Home Management : 23-37 mins  Lou Cal, OT Acute Rehabilitation Services Pager [phone] Office [phone]   Raymondo Band 07/29/2019, 3:08 PM

## 2019-07-29 NOTE — Progress Notes (Signed)
  NEUROSURGERY PROGRESS NOTE   No issues overnight.  Complains of incisonal back pain No longer having muscle spasms LLE strength continues to improve Intermittent dizziness with standing  EXAM:  BP 132/75 (BP Location: Left Arm)   Pulse 92   Temp 98.2 F (36.8 C)   Resp 13   Ht 5\' 8"  (1.727 m)   Wt 115.7 kg   SpO2 90%   BMI 38.77 kg/m   Awake, alert, oriented  Speech fluent, appropriate  CN grossly intact  MAEW, stable motor Incision: c/d/i  IMPRESSION/PLAN 74 y.o. female  POD#5 revision lumbar hardware. Doing well. - PT/OT rec CIR. Consulted. - continue supportive care - intermittent dizziness with standing - will check CBC, BMP

## 2019-07-29 NOTE — Progress Notes (Signed)
Physical Therapy Treatment Patient Details Name: Brandi Stephens MRN: [id number] DOB: January 13, 1946 Today's Date: 07/29/2019    History of Present Illness 74 yo returned to acute services 07/10/19 from CIR (3/12/-07/10/19 on CIR) due to CT scan revealed loosening of the L2 screws and a small paraspinal abscess. 3/24 aspiration by radiology.  07/24/19 to OR for L2-S1 extension of previous fusion due to instrumentation failure. Most recent lumbar fusion was 05/30/19 (L2/3) with d/c home the next day and re-admission to the hospital on 06/24/19 for AKI and back pain/spasm for which she d/c to CIR 3/12-3/24/21.  PMH-L RTC repair, multiple lumbar spine surgeries, R knee arthroscopy, cervical spine surgery, tremor, RLS, prediabetic, neuropathy, HTN, gout, COPD, CKD, diastolic CHF, anemia.    PT Comments    Now that post operative pain and spasms have diminished, pt is able to progress her gait and mobility.  She reports no spasms today with only incisional pain at 4/10 after gait.  She remains very weak, able to walk short distances with the RW and then needing seated rest.  She fatigued quickly as we continued to go further into the hallway having to keep chairs close due to progressively buckling LEs.  With seated rest, she could recover and continue again.  Pt is much more clear headed, was able to repeat back precautions and brace use today.  She continues to be an excellent rehab candidate as her activity tolerance is now improving.  PT will continue to follow acutely for safe mobility progression.  Follow Up Recommendations  CIR     Equipment Recommendations  None recommended by PT    Recommendations for Other Services   NA     Precautions / Restrictions Precautions Precautions: Back;Fall Precaution Booklet Issued: Yes (comment) Precaution Comments: pt able to repeat 3/3 back precautions and correctly report brace use Required Braces or Orthoses: Spinal Brace Spinal Brace: Lumbar corset;Applied in  sitting position Restrictions Weight Bearing Restrictions: No    Mobility  Bed Mobility Overal bed mobility: Needs Assistance Bed Mobility: Rolling;Sidelying to Sit Rolling: Supervision Sidelying to sit: Min guard;HOB elevated     Sit to sidelying: Mod assist;HOB elevated General bed mobility comments: Pt able to demonstrate log roll correctly, however, heavy use of railing for transitions from side lying <-> sit.  Mod assist to lift both legs back into bed when returning to bed.   Transfers Overall transfer level: Needs assistance Equipment used: Rolling walker (2 wheeled) Transfers: Sit to/from Stand Sit to Stand: Mod assist         General transfer comment: Up to mod assist to support trunk and stabilize RW from lower chair with [place].   Ambulation/Gait Ambulation/Gait assistance: Min assist Gait Distance (Feet): 45 Feet(45x2, 20x1 with two seated rest breaks) Assistive device: Rolling walker (2 wheeled) Gait Pattern/deviations: Step-through pattern;Shuffle Gait velocity: decreased Gait velocity interpretation: 1.31 - 2.62 ft/sec, indicative of limited community ambulator General Gait Details: Pt with shuffling gait, seated rest due to fatigue during gait, last seated rest due to buckling bil LEs .           Balance Overall balance assessment: Needs assistance Sitting-balance support: Feet supported;Bilateral upper extremity supported Sitting balance-Leahy Scale: Fair Sitting balance - Comments: Pt relies on bil hands or RW for support in sitting EOB to unweight her lower back.    Standing balance support: Bilateral upper extremity supported Standing balance-Leahy Scale: Poor Standing balance comment: reliant on UE support in standing over weak bil LEs, assist needed for  peri care after using the bathroom.                             Cognition Arousal/Alertness: Awake/alert Behavior During Therapy: WFL for tasks assessed/performed Overall  Cognitive Status: Within Functional Limits for tasks assessed Area of Impairment: Memory                     Memory: Decreased short-term memory         General Comments: Cognition appears back to normal, A and O x4 with good processing speed.      Exercises      General Comments General comments (skin integrity, edema, etc.): Per pt report her pre-surgery muscle spasms are now gone and her incisional pain is allowing her to move again.        Pertinent Vitals/Pain Pain Assessment: 0-10 Pain Score: 4  Pain Location: back-incisional, no more spasms down the leg Pain Descriptors / Indicators: Guarding;Grimacing Pain Intervention(s): Monitored during session;Limited activity within patient's tolerance;Repositioned;Ice applied           PT Goals (current goals can now be found in the care plan section) Acute Rehab PT Goals Patient Stated Goal: to be safe to go home to see her husband of 50+ years Progress towards PT goals: Progressing toward goals    Frequency    Min 5X/week      PT Plan Current plan remains appropriate       AM-PAC PT "6 Clicks" Mobility   Outcome Measure  Help needed turning from your back to your side while in a flat bed without using bedrails?: A Little Help needed moving from lying on your back to sitting on the side of a flat bed without using bedrails?: A Little Help needed moving to and from a bed to a chair (including a wheelchair)?: A Little Help needed standing up from a chair using your arms (e.g., wheelchair or bedside chair)?: A Lot Help needed to walk in hospital room?: A Little Help needed climbing 3-5 steps with a railing? : A Lot 6 Click Score: 16    End of Session Equipment Utilized During Treatment: Back brace Activity Tolerance: Patient limited by fatigue;Patient limited by pain Patient left: in bed;with call bell/phone within reach   PT Visit Diagnosis: Unsteadiness on feet (R26.81);History of falling  (Z91.81);Pain Pain - Right/Left: (midline) Pain - part of body: (back)     Time: [id number] PT Time Calculation (min) (ACUTE ONLY): 32 min  Charges:  $Gait Training: 8-22 mins $Therapeutic Activity: 8-22 mins                    Verdene Lennert, PT, DPT  Acute Rehabilitation [phone] pager #[phone] office     07/29/2019, 3:45 PM

## 2019-07-30 NOTE — Progress Notes (Signed)
  NEUROSURGERY PROGRESS NOTE   No issues overnight. Pain was 2-3 this am until IV team had to move her in bed. Reports being left in an awkward position and now pain has increased.  Dizziness at times.  Eager for rehab  EXAM:  BP (!) 167/87 (BP Location: Left Arm)   Pulse 98   Temp 98.3 F (36.8 C) (Oral)   Resp 15   Ht 5\' 8"  (1.727 m)   Wt 115.7 kg   SpO2 90%   BMI 38.77 kg/m   Awake, alert, oriented  Speech fluent, appropriate  CN grossly intact  MAEW, stable motor Incision: c/d/i  IMPRESSION/PLAN 74 y.o. female POD#6revision lumbar hardware. Doing well. - PT/OTrec CIR. Awaiting insurance approval - continue supportive care - intermittent dizziness with standing - labs reassuring. Will d/c some of the muscle relaxers since her spasms are resolved and monitor

## 2019-07-30 NOTE — Progress Notes (Signed)
Pt walked entire unit hallway with RN at standby. Pt took 1 break to sit in chair. No complaints.

## 2019-07-30 NOTE — Progress Notes (Signed)
Physical Therapy Treatment Patient Details Name: Brandi Stephens MRN: [id number] DOB: [birth year] Today's Date: 07/30/2019    History of Present Illness 74 yo returned to acute services 07/10/19 from CIR (3/12/-07/10/19 on CIR) due to CT scan revealed loosening of the L2 screws and a small paraspinal abscess. 3/24 aspiration by radiology.  07/24/19 to OR for L2-S1 extension of previous fusion due to instrumentation failure. Most recent lumbar fusion was 05/30/19 (L2/3) with d/c home the next day and re-admission to the hospital on 06/24/19 for AKI and back pain/spasm for which she d/c to CIR 3/12-3/24/21.  PMH-L RTC repair, multiple lumbar spine surgeries, R knee arthroscopy, cervical spine surgery, tremor, RLS, prediabetic, neuropathy, HTN, gout, COPD, CKD, diastolic CHF, anemia.    PT Comments    Pt in bed upon arrival of PT, still reporting nausea that limits her activity for today, but is agreeable to repositioning and short ambulation to bathroom. The pt was able to demo good bed mobility with use of log roll technique as well as reduced need for assist to come to standing. The pt continues to need VCs for hand positioning and RW management, but had no episodes of knee buckling today. The pt will continue to benefit from skilled PT to progress functional strengthening and ambulation endurance prior to d/c.    Follow Up Recommendations  CIR     Equipment Recommendations  None recommended by PT    Recommendations for Other Services       Precautions / Restrictions Precautions Precautions: Back;Fall Precaution Booklet Issued: Yes (comment) Precaution Comments: pt able to repeat 3/3 back precautions and correctly report brace use Required Braces or Orthoses: Spinal Brace Spinal Brace: Lumbar corset;Applied in sitting position Restrictions Weight Bearing Restrictions: No    Mobility  Bed Mobility Overal bed mobility: Needs Assistance Bed Mobility: Rolling;Sidelying to Sit Rolling:  Supervision Sidelying to sit: Min guard;HOB elevated     Sit to sidelying: Min guard General bed mobility comments: Pt able to demonstrate log roll correctly, however, heavy use of railing for transitions from side lying <-> sit.  pt able to position BLE into bed with return to supine.  Transfers Overall transfer level: Needs assistance Equipment used: Rolling walker (2 wheeled) Transfers: Sit to/from Stand Sit to Stand: Min guard         General transfer comment: minG with extra time and VC for hand positioning to power up, minA to stabilize upon standing. cues for RW positioning and use  Ambulation/Gait Ambulation/Gait assistance: Min assist Gait Distance (Feet): 15 Feet(15 ft x2) Assistive device: Rolling walker (2 wheeled) Gait Pattern/deviations: Step-through pattern;Shuffle Gait velocity: decreased Gait velocity interpretation: <1.31 ft/sec, indicative of household ambulator General Gait Details: Pt with shuffling gait, no instances of knee buckling today, but pt with sig shorter ambulation today than in past due to reports of n/v   Stairs             Wheelchair Mobility    Modified Rankin (Stroke Patients Only)       Balance Overall balance assessment: Needs assistance Sitting-balance support: Feet supported;Bilateral upper extremity supported Sitting balance-Leahy Scale: Fair Sitting balance - Comments: Pt relies on bil hands or RW for support in sitting EOB to unweight her lower back.    Standing balance support: Bilateral upper extremity supported Standing balance-Leahy Scale: Poor Standing balance comment: reliant on BUE for ambulaiton, able to pull up underwear without any UE support or LOB, still needs assist for pericare with spinal precautions  Cognition Arousal/Alertness: Awake/alert Behavior During Therapy: WFL for tasks assessed/performed Overall Cognitive Status: Within Functional Limits for tasks  assessed                                 General Comments: Cognition appears back to normal, A and O x4 with good processing speed.      Exercises      General Comments        Pertinent Vitals/Pain Pain Assessment: 0-10 Pain Score: 3  Pain Location: back-incisional, no more spasms down the leg, pt nauseous all day, pain from constipation as well in abdomen Pain Descriptors / Indicators: Guarding;Grimacing;Dull;Discomfort;Sore Pain Intervention(s): Limited activity within patient's tolerance;Monitored during session;Repositioned;Heat applied    Home Living                      Prior Function            PT Goals (current goals can now be found in the care plan section) Acute Rehab PT Goals Patient Stated Goal: to be safe to go home to see her husband of 50+ years PT Goal Formulation: With patient Time For Goal Achievement: 08/08/19 Potential to Achieve Goals: Good Progress towards PT goals: Progressing toward goals    Frequency    Min 5X/week      PT Plan Current plan remains appropriate    Co-evaluation              AM-PAC PT "6 Clicks" Mobility   Outcome Measure  Help needed turning from your back to your side while in a flat bed without using bedrails?: A Little Help needed moving from lying on your back to sitting on the side of a flat bed without using bedrails?: A Little Help needed moving to and from a bed to a chair (including a wheelchair)?: A Little Help needed standing up from a chair using your arms (e.g., wheelchair or bedside chair)?: A Little Help needed to walk in hospital room?: A Little Help needed climbing 3-5 steps with a railing? : A Lot 6 Click Score: 17    End of Session Equipment Utilized During Treatment: Back brace Activity Tolerance: Patient limited by fatigue;Patient limited by pain Patient left: in bed;with call bell/phone within reach;with nursing/sitter in room Nurse Communication: Mobility  status PT Visit Diagnosis: Unsteadiness on feet (R26.81);History of falling (Z91.81);Pain Pain - Right/Left: (midline) Pain - part of body: (back)     Time: [id number] PT Time Calculation (min) (ACUTE ONLY): 20 min  Charges:  $Gait Training: 8-22 mins                     Karma Ganja, PT, DPT   Acute Rehabilitation Department Pager #: [phone]   Otho Bellows 07/30/2019, 3:26 PM

## 2019-07-30 NOTE — Progress Notes (Signed)
Inpatient Rehab Admissions Coordinator:   Opened case for insurance authorization with [employer] and awaiting determination.  I have no beds available for this patient to admit to CIR today.  Will continue to follow for timing of potential admission pending bed availability.    Shann Medal, PT, DPT Admissions Coordinator [phone] 07/30/19  11:48 AM

## 2019-07-31 MED ORDER — SORBITOL 70 % SOLN
960.0000 mL | TOPICAL_OIL | Freq: Once | ORAL | Status: AC
  Administered 2019-07-31: 960 mL via RECTAL
  Filled 2019-07-31: qty 473

## 2019-07-31 NOTE — Progress Notes (Signed)
Patient walked around unit with RN, needing 1 break.

## 2019-07-31 NOTE — Progress Notes (Addendum)
1115: Bisacodyl suppository given. Awaiting results. Patient also had Colace, Senokot, Miralax, prune juice, and coffee and walked around the unit with no results.  1830: SMOG enema given. Awaiting results.

## 2019-07-31 NOTE — Progress Notes (Signed)
PT Cancellation Note  Patient Details Name: Brandi Stephens MRN: [id number] DOB: 21-Mar-1946   Cancelled Treatment:    Reason Eval/Treat Not Completed: Other (comment).  Pt nauseated presumably from constipation.  She did walk the unit with the RN earlier today and has been given a suppository.  Pt did not feel up for ambulation again at this time.  I encouraged her to walk another lap with RN staff then next time she went to the restroom to try to have a BM as walking helps to get her bowls moving as well.  PT will check back tomorrow.   Thanks,  Verdene Lennert, PT, DPT  Acute Rehabilitation [phone] pager #[phone] office       [name] 07/31/2019, 11:33 PM

## 2019-07-31 NOTE — Progress Notes (Signed)
  NEUROSURGERY PROGRESS NOTE   No issues overnight. Pt reports incisional back pain and some pain with walking, rated at worst 5/10. No further back spasms.  EXAM:  BP (!) 179/85 (BP Location: Left Arm)   Pulse (!) 103   Temp 98.1 F (36.7 C)   Resp 14   Ht 5\' 8"  (1.727 m)   Wt 115.7 kg   SpO2 91%   BMI 38.77 kg/m   Awake, alert, oriented  Speech fluent, appropriate  CN grossly intact  5/5 BUE/BLE   IMPRESSION:  74 y.o. female s/p hardware revision for postoperative osteo/discitis. Pain is improving with improved ambulation.  PLAN: - Cont PT/OT, will need CIR eval - Cont abx, total of 6wk course.

## 2019-08-01 NOTE — Progress Notes (Signed)
  NEUROSURGERY PROGRESS NOTE   Was given enema yesterday and therefore was up using restroom all night.  Otherwise, doing well. No concerns this am. Eager for rehab.  EXAM:  BP 129/61 (BP Location: Left Arm)   Pulse 96   Temp 97.8 F (36.6 C) (Oral)   Resp 15   Ht 5\' 8"  (1.727 m)   Wt 115.7 kg   SpO2 (!) 88%   BMI 38.77 kg/m   Awake, alert, oriented  Speech fluent, appropriate  CN grossly intact  5/5 BUE/BLE  Incision: c/d/i  IMPRESSION/PLAN 74 y.o. female s/p hardware revision for postoperative osteo/discitis. Continues to improve - Awaiting CIR approval. - Continue abx

## 2019-08-01 NOTE — H&P (Signed)
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 Jana Hakim, hypothyroidism, COPD and quit smoking approximately 13 months ago, tremors as well as restless leg syndrome, chronic constipation, chronic kidney disease stage III, lumbar spinal stenosis post recent decompression and fusion 05/30/2019 per Dr. Kathyrn Sheriff discharge to home supervision level with rolling walker.  History taken from chart review and patient.  Patient lives with spouse.  Two-level home 2 steps to entry.  Reports by family since recent back surgery was limited ambulation secondary to mostly staying in bed requiring assistance for bathing and dressing.  Patient presented 06/25/2019 with increasing low back pain with radiation and weakness in the left leg.  Admission chemistries hemoglobin 9.7, WBC 12,700, BUN 70, creatinine 2.83, sodium 133, SARS coronavirus negative, ESR 136, lactic acid 0.9.  CT renal study negative for hydronephrosis or ureteral stone.  Blood cultures 1/4 blood cultures with GPC likely contaminant.  MRI lumbar spine showed postoperative changes from recent extension of posterior decompression and fusion L2-3 level, persistent severe stenosis at the level of L2-3 with question circumferential epidural collection.  Approximate 5 x 4 cm x 7.4 cm heterogeneous collection within the posterior paraspinous soft tissue extending from L3-L5-S1.  Follow-up neurosurgery Dr. Kathyrn Sheriff regards to latest back surgery no current plan for intervention at that time she was started on Decadron.  Renal function improved with gentle IV fluids.  Her hospital course was complicated by steroid-induced hyperglycemia.  Pain management initially maintained with MS Contin.  She was admitted to inpatient rehab services 06/28/2019 with slow progressive gains ongoing issues  with regards to pain management.  She was ambulating distances of 200 feet assistive device needing multiple rest breaks ongoing complaints of back pain with followed by neurosurgery.  CT of abdomen completed due to bouts of constipation showed no bowel obstruction however there was concerns of interval posterior slippage of the disc space at L2-3 as well as small paraspinal abscess L2-3.  Films were reviewed by neurosurgery recommendations for interventional radiology for aspiration of joint L2-3 and infectious disease consulted for antibiotic care.  Due to patient's ongoing limitations physically as well as work-up for abscess she was transferred to acute care services 07/12/2018 patient had been started on IV vancomycin as well as cefepime per infectious disease.  Cultures grew MS coag negative staph and recommendations were for 6 weeks of antibiotic care with cefazolin x6 weeks and then consider transitioning to oral antibiotics.  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 Kathyrn Sheriff.  Lumbar corset maintained applied in sitting position.  Maintained on Lovenox for DVT prophylaxis.  Pain control ongoing with MS Contin 15 mg every 12 hours as well as oxycodone for breakthrough pain.  Therapy evaluations have been completed postoperatively and patient is admitted for a comprehensive rehab program.  Please see preadmission assessment from earlier today as well.  Review of Systems  Constitutional: Negative for chills and fever.  HENT: Negative for hearing loss.   Eyes: Negative for blurred vision and double vision.  Respiratory: Negative for cough and shortness of breath.  Cardiovascular: Negative for chest pain and palpitations.  Gastrointestinal: Positive for constipation. Negative for heartburn and nausea.       Chronic constipation, GERD  Genitourinary: Positive for urgency. Negative for dysuria, flank pain and hematuria.  Musculoskeletal: Positive for back pain, joint pain and myalgias.  Skin: Negative for rash.  Neurological: Positive for tremors, sensory change, focal weakness and weakness.  Psychiatric/Behavioral: Positive for depression. The patient has insomnia.        Anxiety   Past Medical History:  Diagnosis Date  . AIN (anal intraepithelial neoplasia) anal canal   . Anemia    with pregnancy  . Anxiety   . Arthritis    knees, lower back, knees  . Chronic constipation   . Chronic diastolic CHF (congestive heart failure) ([place]) 07/05/2019  . CKD (chronic kidney disease)   . Coarse tremors    Essential  . COPD (chronic obstructive pulmonary disease) ([place])    2017 chest CT, history of  . Degenerative lumbar spinal stenosis   . Depression   . Depression 07/05/2019  . Drug dependency ([place])   . Elevated PTHrP level 05/09/2019  . ETOH abuse 07/05/2019  . GERD (gastroesophageal reflux disease)    pt denies  . Gout   . Grade I diastolic dysfunction 61/44/3154   Noted ECHO  . Hemorrhoids   . History of adenomatous polyp of colon   . History of basal cell carcinoma (BCC) excision 2015   nose  . History of sepsis 05/14/2014   post lumbar surgery  . History of shingles    x2  . Hyperlipidemia   . Hypertension   . Hypothyroidism   . Irregular heart beat    history of while undergoing radiation  . Malignant neoplasm of upper-inner quadrant of left breast in female, estrogen receptor positive [facility]) oncologist-  dr Jana Hakim--  per lov in epic no recurrence   dx 07-13-2015  Left breast invasive ductal carcinoma, Stage IA, Grade 1 (TXN0),  09-16-2015  s/p  left breast lumpectomy with sln bx's,  completed radiation 11-18-2015,  started  antiestrogen therapy 12-14-2015  . Neuropathy    Left foot and back of left leg  . Obesity (BMI 30-39.9) 07/05/2019  . Personal history of radiation therapy    completed 11-18-2015  left breast  . Pneumonia   . PONV (postoperative nausea and vomiting)   . Prediabetes    diet controlled, no med  . Restless leg syndrome   . Seasonal allergies   . Seizure ([place]) 05/2015   due to sepsis, only one time episode  . Tremor   . Wears partial dentures    lower    Past Surgical History:  Procedure Laterality Date  . ABDOMINAL HYSTERECTOMY  1985  . BASAL CELL CARCINOMA EXCISION  10/15   nose  . BREAST BIOPSY Right 08/24/2015  . BREAST BIOPSY Right 07/13/2015  . BREAST BIOPSY Left 07/13/2015  . BREAST EXCISIONAL BIOPSY Left 1972  . BREAST LUMPECTOMY Left   . BREAST LUMPECTOMY WITH RADIOACTIVE SEED AND SENTINEL LYMPH NODE BIOPSY Left 09/16/2015   Procedure: BREAST LUMPECTOMY WITH RADIOACTIVE SEED AND SENTINEL LYMPH NODE BIOPSY;  Surgeon: Stark Klein, MD;  Location: [facility];  Service: General;  Laterality: Left;  . BREAST REDUCTION SURGERY Bilateral 1998  . CATARACT EXTRACTION W/ INTRAOCULAR LENS IMPLANT Left 2016  . [place]   hallo  . COLONOSCOPY  01/2013   polyps  . EYE SURGERY Left 2016  . HAND SURGERY Left  02-19-2002   dr Fredna Dow  '@[facility]'    repair collateral ligament/ MPJ left thumb  . HARDWARE REMOVAL N/A 07/24/2019   Procedure: REVISION OF HARDWARE Lumbar two - Sacral one. Extention of Fusion to Lumbar one;  Surgeon: [name], MD;  Location: [facility];  Service: Neurosurgery;  Laterality: N/A;  . HEMORRHOID SURGERY  2008  . HIGH RESOLUTION ANOSCOPY N/A 02/28/2018   Procedure: HIGH RESOLUTION ANOSCOPY WITH BIOPSY;  Surgeon: Leighton Ruff, MD;  Location[facility];  Service: General;  Laterality: N/A;  . KNEE ARTHROSCOPY Right 2002  . LUMBAR SPINE SURGERY  03-20-2015   dr Kathyrn Sheriff   fusion L4-5, L5-S1  . MOUTH SURGERY   07/14/2018   2 infected teeth removed  . MULTIPLE TOOTH EXTRACTIONS     with bone grafting lower bottom right and left  . REDUCTION MAMMAPLASTY Bilateral   . RIGHT COLECTOMY  09-10-2003   dr Margot Chimes '@[facility]'    multiple colon polyps (per path tubular adenoma's, hyperplastic , benign appendix, benign two lymph nodes  . ROTATOR CUFF REPAIR Left 04/2016  . TONSILLECTOMY  1969  . TUBAL LIGATION Bilateral 1982   Family History  Problem Relation Age of Onset  . Atrial fibrillation Mother   . Ulcers Father   . Breast cancer Maternal Aunt   . Lung cancer Maternal Uncle   . Stomach cancer Maternal Grandmother   . Lung cancer Maternal Aunt   . Brain cancer Maternal Aunt   . Prostate cancer Maternal Grandfather   . Stroke Paternal Grandmother   . Tuberculosis Paternal Grandfather   . Colon cancer Neg Hx   . Esophageal cancer Neg Hx   . Rectal cancer Neg Hx    Social History:  reports that she quit smoking about 14 months ago. Her smoking use included cigarettes. She has a 17.50 pack-year smoking history. She has never used smokeless tobacco. She reports current alcohol use. She reports that she does not use drugs. Allergies:  Allergies  Allergen Reactions  . Ampicillin Hives and Other (See Comments)    Severe reaction in February 2017 1.5 month to have hives to go away  . Penicillins Hives and Other (See Comments)    Severe reaction in February 2017 1.5 month to have hives to go away Has patient had a PCN reaction causing immediate rash, facial/tongue/throat swelling, SOB or lightheadedness with hypotension: #  #  #  YES  #  #  #  Has patient had a PCN reaction causing severe rash involving mucus membranes or skin necrosis: No Has patient had a PCN reaction that required hospitalization No Has patient had a PCN reaction occurring within the last 10 years: No   . Gabapentin Swelling    UNSPECIFIED REACTION   . Other Itching    UNSPECIFIED Analgesics  . Codeine Nausea And Vomiting  .  Hydrocodone-Acetaminophen Itching   Medications Prior to Admission  Medication Sig Dispense Refill  . acetaminophen (TYLENOL) 325 MG tablet Take 2 tablets (650 mg total) by mouth every 6 (six) hours as needed for moderate pain. 20 tablet 0  . albuterol (VENTOLIN HFA) 108 (90 Base) MCG/ACT inhaler INHALE 2 PUFFS BY MOUTH EVERY 4 TO 6 HOURS AS NEEDED 42.5 g 0  . allopurinol (ZYLOPRIM) 300 MG tablet TAKE 1 TABLET(300 MG) BY MOUTH DAILY 90 tablet 0  . ALPRAZolam (XANAX) 0.25 MG tablet Take 1 tablet (0.25 mg total) by mouth 3 (three) times daily as needed for anxiety. 20 tablet 0  . atorvastatin (LIPITOR)  20 MG tablet Take 1 tablet (20 mg total) by mouth daily. 90 tablet 1  . Biotin (BIOTIN 5000) 5 MG CAPS Take 5 mg by mouth daily.     . Calcium Carb-Cholecalciferol (CALCIUM 1000 + D PO) Take 1 tablet by mouth daily.    . colchicine 0.6 MG tablet Take 2 tablets now and 1 tablet an hour later for flare up (Patient taking differently: Take 0.6-1.2 mg by mouth daily as needed (gout). ) 90 tablet 0  . diclofenac Sodium (VOLTAREN) 1 % GEL Apply 4 g topically 4 (four) times daily. 4 g 0  . docusate sodium (COLACE) 100 MG capsule Take 1 capsule (100 mg total) by mouth 3 (three) times daily. 10 capsule 0  . DULoxetine (CYMBALTA) 60 MG capsule TAKE 1 CAPSULE(60 MG) BY MOUTH DAILY 30 capsule 2  . levothyroxine (SYNTHROID) 75 MCG tablet Take 1 tablet (75 mcg total) by mouth daily before breakfast. 90 tablet 1  . magnesium gluconate (MAGONATE) 500 MG tablet Take 2 tablets (1,000 mg total) by mouth at bedtime. 30 tablet 0  . metaxalone (SKELAXIN) 800 MG tablet Take 1 tablet (800 mg total) by mouth 3 (three) times daily. 90 tablet 0  . methocarbamol (ROBAXIN) 500 MG tablet Take 2 tablets (1,000 mg total) by mouth 3 (three) times daily. 90 tablet 0  . metoprolol succinate (TOPROL-XL) 25 MG 24 hr tablet Take 0.5 tablets (12.5 mg total) by mouth daily. 30 tablet 0  . oxyCODONE (OXY IR/ROXICODONE) 5 MG immediate  release tablet Take 1 tablet (5 mg total) by mouth every 4 (four) hours as needed for severe pain. 20 tablet 0  . polyethylene glycol powder (MIRALAX) powder Take 17 g by mouth daily as needed for moderate constipation.     . potassium chloride SA (KLOR-CON) 20 MEQ tablet Take 1 tablet (20 mEq total) by mouth daily.    . primidone (MYSOLINE) 50 MG tablet TAKE 1 TABLET(50 MG) BY MOUTH AT BEDTIME 90 tablet 1  . QUEtiapine (SEROQUEL) 25 MG tablet Take 1 tablet (25 mg total) by mouth at bedtime. 30 tablet 0  . rOPINIRole (REQUIP) 0.25 MG tablet Take 3 tablets (0.75 mg total) by mouth at bedtime. 30 tablet 0  . vitamin B-12 (CYANOCOBALAMIN) 1000 MCG tablet Take 1 tablet (1,000 mcg total) by mouth daily. 30 tablet 0  . melatonin 300 MCG TABS Take 35 tablets (10,500 mcg total) by mouth at bedtime. (Patient not taking: Reported on 07/11/2019) 30 tablet 0  . morphine (MS CONTIN) 15 MG 12 hr tablet Take 1 tablet (15 mg total) by mouth every 12 (twelve) hours. (Patient not taking: Reported on 07/11/2019) 10 tablet 0    Drug Regimen Review Drug regimen was reviewed and remains appropriate with no significant issues identified  Home: Home Living Family/patient expects to be discharged to:: Private residence Living Arrangements: Spouse/significant other, Children Available Help at Discharge: Family, Available 24 hours/day Type of Home: House Home Access: Stairs to enter [employer] of Steps: 3 Entrance Stairs-Rails: None Home Layout: Two level, Bed/bath upstairs Alternate Level Stairs-Number of Steps: 14 Alternate Level Stairs-Rails: Can reach both Bathroom Shower/Tub: Gaffer, Chiropodist: Handicapped height Bathroom Accessibility: Yes Home Equipment: Environmental consultant - 2 wheels, Cane - single point, Grab bars - tub/shower, Environmental consultant - 4 wheels, Bedside commode, Shower seat, Tub bench(has ordered bed rail to attach to adjustable bed; bidet) Additional Comments: patient owns  tub bench but was standing in the shower prior to this admission with assistance  of daughter  Lives With: Spouse   Functional History: Prior Function Level of Independence: Needs assistance Gait / Transfers Assistance Needed: on CIR was walking up to 220 ft; up/down 14 steps; however did fall on the steps her last day on CIR--reports she tried to lead with her weak leg "not thinking" ADL's / Homemaking Assistance Needed: Reports she was working towards performing BADLs at [employer]  Comments: reports wanting to enjoy playing with her 32 y.o. granddaughter and walk outside  Functional Status:  Mobility: Bed Mobility Overal bed mobility: Needs Assistance Bed Mobility: Rolling, Sidelying to Sit, Sit to Sidelying Rolling: Supervision Sidelying to sit: Supervision Sit to supine: Min assist Sit to sidelying: Mod assist General bed mobility comments: Suprvision to log roll to R side of EOB, heavy use of rail, mod assist to help lift both legs back into bed. again, heavy use of rail.  Transfers Overall transfer level: Needs assistance Equipment used: Rolling walker (2 wheeled) Transfers: Sit to/from Stand Sit to Stand: Min assist Stand pivot transfers: Min assist General transfer comment: Min assist from lower bed today, attempted to push up with one hand on bed, but pt unable to do this, so reverted back to PT stabilizing RW and pt pushing straight down through bil RW handles.   Ambulation/Gait Ambulation/Gait assistance: Min assist Gait Distance (Feet): 130 Feet Assistive device: Rolling walker (2 wheeled) Gait Pattern/deviations: Step-through pattern, Shuffle General Gait Details: cues to get inside of RW handles during gait and turning, pt continues to report a bit of vertigo, mainly when looking down, or during transitions from supine to sitting up EOB.  Pt had some fleeting left beating nystagmus during oculomotor assessment, but may need further vestibular assessment for BPPV at rehab.    Gait velocity: decreased Gait velocity interpretation: <1.8 ft/sec, indicate of risk for recurrent falls    ADL: ADL Overall ADL's : Needs assistance/impaired Eating/Feeding: Set up, Bed level Eating/Feeding Details (indicate cue type and reason): Pt continues to be in so much pain she was unable to sit up long enough to feed herself. Grooming: Set up, Supervision/safety, Sitting Grooming Details (indicate cue type and reason): seated on BSC at sink in bathroom Upper Body Bathing: Minimal assistance, Bed level Upper Body Bathing Details (indicate cue type and reason): Pt unable to tolerate sitting long enough to bathe Lower Body Bathing: Maximal assistance, Sit to/from stand Lower Body Bathing Details (indicate cue type and reason): Pt in too much pain to attempt much LE dressing. Upper Body Dressing : Minimal assistance, Sitting Upper Body Dressing Details (indicate cue type and reason): for donning/doffing lumbar brace Lower Body Dressing: Maximal assistance, Sit to/from stand Lower Body Dressing Details (indicate cue type and reason): requires assist to don socks at bed level today Toilet Transfer: Stand-pivot, BSC, RW, Minimal assistance Toilet Transfer Details (indicate cue type and reason): min assist for steading as pt nauseaous. Toileting- Clothing Manipulation and Hygiene: Moderate assistance, Sitting/lateral lean Toileting - Clothing Manipulation Details (indicate cue type and reason): Pt has difficulty reaching to clean self due to pain. Functional mobility during ADLs: Minimal assistance, Rolling walker General ADL Comments: Pt very limited by back pain.  Only walked a few steps.  Pt also with N/V.  Cognition: Cognition Overall Cognitive Status: Impaired/Different from baseline Orientation Level: Oriented X4 Cognition Arousal/Alertness: Awake/alert Behavior During Therapy: WFL for tasks assessed/performed Overall Cognitive Status: Impaired/Different from baseline Area  of Impairment: Memory Orientation Level: Time(just one day off on day/date) Current Attention Level: Sustained Memory: Decreased  short-term memory General Comments: Having a hard time remembering the names of things when ordering lunch today.  Needs re-orientation to day of the week and date (not all that unusual for her long hospitalization).   Physical Exam: Blood pressure (!) 157/74, pulse 90, temperature 98 F (36.7 C), temperature source Oral, resp. rate 15, height '5\' 8"'  (1.727 m), weight 115.7 kg, SpO2 (!) 86 %. Physical Exam  Vitals reviewed. Constitutional: She appears well-developed.  Morbidly obese  HENT:  Head: Normocephalic and atraumatic.  Eyes: EOM are normal. Right eye exhibits no discharge. Left eye exhibits no discharge.  Neck: No tracheal deviation present. No thyromegaly present.  Respiratory: Effort normal. No respiratory distress.  GI: Soft. She exhibits no distension.  Musculoskeletal:     Comments: Lower extremity edema  Neurological: She is alert.  Patient is alert.   Somewhat anxious.   Follows commands oriented x3. Motor: Bilateral upper extremities: 4/5 proximal distal Right lower extremity: 4/5 proximal distal Left lower extremity: 3-3+/5 proximal distal Sensation diminished to light touch left greater than sign right foot, greatest in fourth and fifth digits  Skin: Skin is warm and dry.  Back incision not examined  Psychiatric: She has a normal mood and affect. Her behavior is normal.    No results found for this or any previous visit (from the past 48 hour(s)). No results found.     Medical Problem List and Plan: 1.  Decreased functional mobility secondary to acute on chronic back pain status post recent L2-3 fusion 3/49/6116 complicated by instrumentation failure with postoperative lumbar infection status post removal of previous hardware placement of posterior segmental instrumentation L1-S1 with pedicle screws L1, L2 revision biomechanical  cage 07/24/2019.  Back brace when out of bed  -patient may not shower  -ELOS/Goals: 7-10 days/supervision/min a  Admit to CIR 2.  Antithrombotics: -DVT/anticoagulation: Lovenox.  Most recent vascular study negative  -antiplatelet therapy: N/A 3. Pain Management: MS Contin 15 mg every 12 hours, Cymbalta 60 mg daily, hydrocodone as needed, Valium as needed for muscle spasms,  Monitor with increased exertion 4. Mood: Provide emotional support  -antipsychotic agents: Seroquel 25 mg nightly 5. Neuropsych: This patient is capable of making decisions on her own behalf. 6. Skin/Wound Care: Routine skin checks 7. Fluids/Electrolytes/Nutrition: Routine in and outs.  CMP ordered. 8.  ID/MS coags negative staph.  Continue IV antibiotic Ancef x6 weeks initiated 07/11/2019 9.  Acute on chronic anemia.  CBC ordered. 10.  Hypertension.  Toprol 12.5 mg daily.  Monitor with increased activity. 11.  Chronic constipation.  Continue to adjust bowel program 12.  Hypothyroidism.  Synthroid 13.  History of breast cancer.  Follow-up outpatient Dr. Jana Hakim.Continue Arimidex 14.  History of restless leg syndrome.  Mysoline 50 mg nightly, Requip 0.75 mg nightly 15.  Hyperlipidemia.  Lipitor  Lavon Paganini Angiulli, PA-C  08/02/2019  I have personally performed a face to face diagnostic evaluation, including, but not limited to relevant history and physical exam findings, of this patient and developed relevant assessment and plan.  Additionally, I have reviewed and concur with the physician assistant's documentation above.  Delice Lesch, MD, ABPMR

## 2019-08-01 NOTE — Progress Notes (Signed)
Physical Therapy Treatment Patient Details Name: Brandi Stephens MRN: [id number] DOB: 06-Dec-1945 Today's Date: 08/01/2019    History of Present Illness 74 yo returned to acute services 07/10/19 from CIR (3/12/-07/10/19 on CIR) due to CT scan revealed loosening of the L2 screws and a small paraspinal abscess. 3/24 aspiration by radiology.  07/24/19 to OR for L2-S1 extension of previous fusion due to instrumentation failure. Most recent lumbar fusion was 05/30/19 (L2/3) with d/c home the next day and re-admission to the hospital on 06/24/19 for AKI and back pain/spasm for which she d/c to CIR 3/12-3/24/21.  PMH-L RTC repair, multiple lumbar spine surgeries, R knee arthroscopy, cervical spine surgery, tremor, RLS, prediabetic, neuropathy, HTN, gout, COPD, CKD, diastolic CHF, anemia.    PT Comments    Pt reporting continued dizziness with position changes since her last surgery.  I am wondering if she doesn't have some BPPV from positioning during surgery.  Being that her surgery is still so fresh, I think it would be better to wait and see if the dizziness resolves before putting her through [employer] and [facility].  It does not currently impede her mobility or gait participation.  She was able to walk the hallway today with RW and only one seated rest break.  LEs less buckling by the end of walk than prior session with me.   PT to follow acutely for deficits listed below.     Follow Up Recommendations  CIR     Equipment Recommendations  None recommended by PT    Recommendations for Other Services   NA     Precautions / Restrictions Precautions Precautions: Back;Fall Required Braces or Orthoses: Spinal Brace Spinal Brace: Lumbar corset;Applied in sitting position    Mobility  Bed Mobility Overal bed mobility: Needs Assistance Bed Mobility: Rolling;Sidelying to Sit;Sit to Sidelying Rolling: Supervision Sidelying to sit: Supervision     Sit to sidelying: Mod assist General bed mobility  comments: Pt does better rolling to the right due to left hip pain when she rolls to the left. She reports she gets out on the right at home.  We practiced to the left as she was turned that way and it was closer to her initial destination of the bathroom.  Heavy reliance on rails for rolling and transitions to and from sitting to side lying.  Assist needed to lift both feet back into bed.   Transfers Overall transfer level: Needs assistance Equipment used: Rolling walker (2 wheeled) Transfers: Sit to/from Stand Sit to Stand: Min assist         General transfer comment: Min assist to power up to standing and stabilize RW.  Not yet able to push up from bed, so RW needs stabilizing so she can come up holding the walker, Does better from elevated BSC with grab bars in bathroom.  Total assist for pericare and donning of shoes prior to gait.   Ambulation/Gait Ambulation/Gait assistance: Min assist Gait Distance (Feet): 65 Feet(x2 with 3 min seated rest) Assistive device: Rolling walker (2 wheeled) Gait Pattern/deviations: Step-through pattern;Shuffle Gait velocity: decreased Gait velocity interpretation: <1.8 ft/sec, indicate of risk for recurrent falls General Gait Details: PT continues to have shuffling gait, when legs get weak, she sits and rests for a few mins and then is able to continue.  Only one seated rest today and bil knees not as bouncy towards the end of gait as they were last time I saw Brandi Stephens.        Balance Overall  balance assessment: Needs assistance Sitting-balance support: Feet supported;Bilateral upper extremity supported Sitting balance-Leahy Scale: Fair     Standing balance support: Bilateral upper extremity supported Standing balance-Leahy Scale: Poor Standing balance comment: reliant on BUE for ambulaiton, able to pull up underwear without any UE support or LOB as long as they are treaded high enough initially so she doesn't have to flex forward, still needs  assist for pericare with spinal precautions                            Cognition Arousal/Alertness: Awake/alert Behavior During Therapy: WFL for tasks assessed/performed Overall Cognitive Status: Impaired/Different from baseline Area of Impairment: Orientation                 Orientation Level: Time(just one day off on day/date)             General Comments: Pt much  better oriented, I would consider being one day off normal for how long she has been here.  Reminded her the calendar is on the wall if she needs to re-orient herself throughout the day.         General Comments General comments (skin integrity, edema, etc.): Pt may have some mild vestibular issues after her most recent surgery.  I am suspicious of a posterior canal as she reports some mild intermittent dizziness when up and some short dizziness when she returns to supine.  Given her acute back surgery, I would likely wait a bit and see if it resolves itself before putting her through [facility], but worth monitoring.       Pertinent Vitals/Pain Pain Assessment: Faces Faces Pain Scale: Hurts whole lot Pain Location: up to 8, had some sharp intermittent pains.  Pain Descriptors / Indicators: Sharp Pain Intervention(s): Limited activity within patient's tolerance;Monitored during session;Repositioned           PT Goals (current goals can now be found in the care plan section) Acute Rehab PT Goals Patient Stated Goal: to be safe to go home to see her husband of 50+ years Progress towards PT goals: Progressing toward goals    Frequency    Min 5X/week      PT Plan Current plan remains appropriate       AM-PAC PT "6 Clicks" Mobility   Outcome Measure  Help needed turning from your back to your side while in a flat bed without using bedrails?: A Little Help needed moving from lying on your back to sitting on the side of a flat bed without using bedrails?: A Little Help  needed moving to and from a bed to a chair (including a wheelchair)?: A Little Help needed standing up from a chair using your arms (e.g., wheelchair or bedside chair)?: A Little Help needed to walk in hospital room?: A Little Help needed climbing 3-5 steps with a railing? : A Lot 6 Click Score: 17    End of Session Equipment Utilized During Treatment: Back brace Activity Tolerance: Patient limited by fatigue;Patient limited by pain Patient left: in bed;with call bell/phone within reach   PT Visit Diagnosis: Unsteadiness on feet (R26.81);History of falling (Z91.81);Pain Pain - Right/Left: (lower) Pain - part of body: (back)     Time: [id number] PT Time Calculation (min) (ACUTE ONLY): 37 min  Charges:  $Gait Training: 8-22 mins $Therapeutic Activity: 8-22 mins             Verdene Lennert, PT, DPT  Acute Rehabilitation #[phone] pager #[phone] office              08/01/2019, 12:18 PM

## 2019-08-01 NOTE — Progress Notes (Signed)
Inpatient Rehab Admissions Coordinator:   I have insurance authorization for CIR; however, I do not have a bed available for this patient today.  Will f/u tomorrow for possible admission pending bed availability.   Shann Medal, PT, DPT Admissions Coordinator [phone] 08/01/19  1:07 PM

## 2019-08-02 ENCOUNTER — Inpatient Hospital Stay (HOSPITAL_COMMUNITY)
Admission: RE | Admit: 2019-08-02 | Discharge: 2019-08-10 | DRG: 560 | Disposition: A | Discharge status: Home Health | Payer: PPO | Source: Intra-hospital | Attending: Physical Medicine & Rehabilitation | Admitting: Physical Medicine & Rehabilitation

## 2019-08-02 DIAGNOSIS — T8140XA Infection following a procedure, unspecified, initial encounter: Secondary | ICD-10-CM | POA: Diagnosis not present

## 2019-08-02 DIAGNOSIS — E8809 Other disorders of plasma-protein metabolism, not elsewhere classified: Secondary | ICD-10-CM | POA: Diagnosis not present

## 2019-08-02 DIAGNOSIS — Z85828 Personal history of other malignant neoplasm of skin: Secondary | ICD-10-CM

## 2019-08-02 DIAGNOSIS — Z885 Allergy status to narcotic agent status: Secondary | ICD-10-CM

## 2019-08-02 DIAGNOSIS — M4620 Osteomyelitis of vertebra, site unspecified: Secondary | ICD-10-CM

## 2019-08-02 DIAGNOSIS — D649 Anemia, unspecified: Secondary | ICD-10-CM | POA: Diagnosis present

## 2019-08-02 DIAGNOSIS — M5416 Radiculopathy, lumbar region: Secondary | ICD-10-CM

## 2019-08-02 DIAGNOSIS — G8929 Other chronic pain: Secondary | ICD-10-CM | POA: Diagnosis not present

## 2019-08-02 DIAGNOSIS — R0989 Other specified symptoms and signs involving the circulatory and respiratory systems: Secondary | ICD-10-CM | POA: Diagnosis not present

## 2019-08-02 DIAGNOSIS — E46 Unspecified protein-calorie malnutrition: Secondary | ICD-10-CM | POA: Diagnosis not present

## 2019-08-02 DIAGNOSIS — N183 Chronic kidney disease, stage 3 unspecified: Secondary | ICD-10-CM | POA: Diagnosis not present

## 2019-08-02 DIAGNOSIS — G8918 Other acute postprocedural pain: Secondary | ICD-10-CM

## 2019-08-02 DIAGNOSIS — Z880 Allergy status to penicillin: Secondary | ICD-10-CM | POA: Diagnosis not present

## 2019-08-02 DIAGNOSIS — R410 Disorientation, unspecified: Secondary | ICD-10-CM | POA: Diagnosis not present

## 2019-08-02 DIAGNOSIS — I129 Hypertensive chronic kidney disease with stage 1 through stage 4 chronic kidney disease, or unspecified chronic kidney disease: Secondary | ICD-10-CM | POA: Diagnosis present

## 2019-08-02 DIAGNOSIS — E039 Hypothyroidism, unspecified: Secondary | ICD-10-CM | POA: Diagnosis present

## 2019-08-02 DIAGNOSIS — Z923 Personal history of irradiation: Secondary | ICD-10-CM | POA: Diagnosis not present

## 2019-08-02 DIAGNOSIS — J449 Chronic obstructive pulmonary disease, unspecified: Secondary | ICD-10-CM | POA: Diagnosis not present

## 2019-08-02 DIAGNOSIS — M170 Bilateral primary osteoarthritis of knee: Secondary | ICD-10-CM | POA: Diagnosis not present

## 2019-08-02 DIAGNOSIS — K5909 Other constipation: Secondary | ICD-10-CM | POA: Diagnosis not present

## 2019-08-02 DIAGNOSIS — Z981 Arthrodesis status: Secondary | ICD-10-CM | POA: Diagnosis not present

## 2019-08-02 DIAGNOSIS — M549 Dorsalgia, unspecified: Secondary | ICD-10-CM | POA: Diagnosis not present

## 2019-08-02 DIAGNOSIS — Z853 Personal history of malignant neoplasm of breast: Secondary | ICD-10-CM | POA: Diagnosis not present

## 2019-08-02 DIAGNOSIS — I5032 Chronic diastolic (congestive) heart failure: Secondary | ICD-10-CM | POA: Diagnosis present

## 2019-08-02 DIAGNOSIS — K219 Gastro-esophageal reflux disease without esophagitis: Secondary | ICD-10-CM | POA: Diagnosis not present

## 2019-08-02 DIAGNOSIS — Z888 Allergy status to other drugs, medicaments and biological substances status: Secondary | ICD-10-CM

## 2019-08-02 DIAGNOSIS — E785 Hyperlipidemia, unspecified: Secondary | ICD-10-CM | POA: Diagnosis not present

## 2019-08-02 DIAGNOSIS — M869 Osteomyelitis, unspecified: Secondary | ICD-10-CM | POA: Diagnosis not present

## 2019-08-02 DIAGNOSIS — F419 Anxiety disorder, unspecified: Secondary | ICD-10-CM | POA: Diagnosis present

## 2019-08-02 DIAGNOSIS — Z79811 Long term (current) use of aromatase inhibitors: Secondary | ICD-10-CM

## 2019-08-02 DIAGNOSIS — I10 Essential (primary) hypertension: Secondary | ICD-10-CM

## 2019-08-02 DIAGNOSIS — G894 Chronic pain syndrome: Secondary | ICD-10-CM | POA: Diagnosis not present

## 2019-08-02 DIAGNOSIS — G2581 Restless legs syndrome: Secondary | ICD-10-CM | POA: Diagnosis present

## 2019-08-02 DIAGNOSIS — D631 Anemia in chronic kidney disease: Secondary | ICD-10-CM | POA: Diagnosis present

## 2019-08-02 DIAGNOSIS — Z87891 Personal history of nicotine dependence: Secondary | ICD-10-CM | POA: Diagnosis not present

## 2019-08-02 DIAGNOSIS — I130 Hypertensive heart and chronic kidney disease with heart failure and stage 1 through stage 4 chronic kidney disease, or unspecified chronic kidney disease: Secondary | ICD-10-CM | POA: Diagnosis not present

## 2019-08-02 DIAGNOSIS — Z4789 Encounter for other orthopedic aftercare: Principal | ICD-10-CM

## 2019-08-02 LAB — CBC
HCT: 31.8 % — ABNORMAL LOW (ref 36.0–46.0)
Hemoglobin: 9.5 g/dL — ABNORMAL LOW (ref 12.0–15.0)
MCH: 28.3 pg (ref 26.0–34.0)
MCHC: 29.9 g/dL — ABNORMAL LOW (ref 30.0–36.0)
MCV: 94.6 fL (ref 80.0–100.0)
Platelets: 300 10*3/uL (ref 150–400)
RBC: 3.36 MIL/uL — ABNORMAL LOW (ref 3.87–5.11)
RDW: 16.1 % — ABNORMAL HIGH (ref 11.5–15.5)
WBC: 10.3 10*3/uL (ref 4.0–10.5)
nRBC: 0 % (ref 0.0–0.2)

## 2019-08-02 LAB — CREATININE, SERUM
Creatinine, Ser: 0.75 mg/dL (ref 0.44–1.00)
GFR calc Af Amer: >60 mL/min (ref >60)
GFR calc non Af Amer: >60 mL/min (ref >60)

## 2019-08-02 MED ORDER — POLYETHYLENE GLYCOL 3350 17 G PO PACK
17.0000 g | PACK | Freq: Every day | ORAL | Status: DC | PRN
  Administered 2019-08-04: 17 g via ORAL
  Filled 2019-08-02: qty 1

## 2019-08-02 MED ORDER — ONDANSETRON HCL 4 MG PO TABS
4.0000 mg | ORAL_TABLET | Freq: Four times a day (QID) | ORAL | Status: DC | PRN
  Administered 2019-08-04 – 2019-08-10 (×5): 4 mg via ORAL
  Filled 2019-08-02 (×6): qty 1

## 2019-08-02 MED ORDER — SODIUM CHLORIDE 0.9% FLUSH
10.0000 mL | INTRAVENOUS | Status: DC | PRN
  Administered 2019-08-04 – 2019-08-10 (×2): 10 mL

## 2019-08-02 MED ORDER — QUETIAPINE FUMARATE 25 MG PO TABS
25.0000 mg | ORAL_TABLET | Freq: Every day | ORAL | Status: DC
  Administered 2019-08-02 – 2019-08-06 (×5): 25 mg via ORAL
  Filled 2019-08-02 (×5): qty 1

## 2019-08-02 MED ORDER — DULOXETINE HCL 60 MG PO CPEP
60.0000 mg | ORAL_CAPSULE | Freq: Every day | ORAL | Status: DC
  Administered 2019-08-03 – 2019-08-10 (×8): 60 mg via ORAL
  Filled 2019-08-02 (×8): qty 1

## 2019-08-02 MED ORDER — METOPROLOL SUCCINATE ER 25 MG PO TB24
12.5000 mg | ORAL_TABLET | Freq: Every day | ORAL | Status: DC
  Administered 2019-08-03 – 2019-08-04 (×2): 12.5 mg via ORAL
  Filled 2019-08-02 (×2): qty 1

## 2019-08-02 MED ORDER — BISACODYL 10 MG RE SUPP
10.0000 mg | Freq: Every day | RECTAL | Status: DC | PRN
  Administered 2019-08-05: 10 mg via RECTAL
  Filled 2019-08-02: qty 1

## 2019-08-02 MED ORDER — ONDANSETRON HCL 4 MG/2ML IJ SOLN
4.0000 mg | Freq: Four times a day (QID) | INTRAMUSCULAR | Status: DC | PRN
  Administered 2019-08-09: 4 mg via INTRAVENOUS
  Filled 2019-08-02: qty 2

## 2019-08-02 MED ORDER — CEFAZOLIN IV (FOR PTA / DISCHARGE USE ONLY): 2.0000 g | Freq: Three times a day (TID) | INTRAVENOUS | 0 refills | Status: DC

## 2019-08-02 MED ORDER — DOCUSATE SODIUM 100 MG PO CAPS
100.0000 mg | ORAL_CAPSULE | Freq: Three times a day (TID) | ORAL | Status: DC
  Administered 2019-08-02 – 2019-08-10 (×23): 100 mg via ORAL
  Filled 2019-08-02 (×24): qty 1

## 2019-08-02 MED ORDER — SODIUM CHLORIDE 0.9% FLUSH
10.0000 mL | Freq: Two times a day (BID) | INTRAVENOUS | Status: DC
  Administered 2019-08-05 – 2019-08-09 (×5): 10 mL

## 2019-08-02 MED ORDER — OXYCODONE HCL 5 MG PO TABS: 5.0000 mg | ORAL_TABLET | ORAL | Status: DC | PRN

## 2019-08-02 MED ORDER — ENOXAPARIN SODIUM 40 MG/0.4ML ~~LOC~~ SOLN
40.0000 mg | SUBCUTANEOUS | Status: DC
  Administered 2019-08-03 – 2019-08-09 (×7): 40 mg via SUBCUTANEOUS
  Filled 2019-08-02 (×10): qty 0.4

## 2019-08-02 MED ORDER — CEFAZOLIN SODIUM-DEXTROSE 2-4 GM/100ML-% IV SOLN
2.0000 g | Freq: Three times a day (TID) | INTRAVENOUS | Status: DC
  Administered 2019-08-02 – 2019-08-10 (×24): 2 g via INTRAVENOUS
  Filled 2019-08-02 (×26): qty 100

## 2019-08-02 MED ORDER — SENNOSIDES-DOCUSATE SODIUM 8.6-50 MG PO TABS: 1.0000 | ORAL_TABLET | Freq: Every evening | ORAL | Status: DC | PRN

## 2019-08-02 MED ORDER — CHLORHEXIDINE GLUCONATE CLOTH 2 % EX PADS
6.0000 | MEDICATED_PAD | Freq: Every day | CUTANEOUS | Status: DC
  Administered 2019-08-02 – 2019-08-03 (×2): 6 via TOPICAL

## 2019-08-02 MED ORDER — ALTEPLASE 2 MG IJ SOLR
2.0000 mg | Freq: Once | INTRAMUSCULAR | Status: AC
  Administered 2019-08-02: 2 mg

## 2019-08-02 MED ORDER — ATORVASTATIN CALCIUM 10 MG PO TABS
20.0000 mg | ORAL_TABLET | Freq: Every day | ORAL | Status: DC
  Administered 2019-08-03 – 2019-08-10 (×8): 20 mg via ORAL
  Filled 2019-08-02 (×8): qty 2

## 2019-08-02 MED ORDER — ENOXAPARIN SODIUM 40 MG/0.4ML ~~LOC~~ SOLN
40.0000 mg | SUBCUTANEOUS | Status: DC
  Administered 2019-08-02: 40 mg via SUBCUTANEOUS

## 2019-08-02 MED ORDER — SORBITOL 70 % SOLN
30.0000 mL | Freq: Every day | Status: DC | PRN
  Administered 2019-08-04 – 2019-08-05 (×2): 30 mL via ORAL
  Filled 2019-08-02 (×2): qty 30

## 2019-08-02 MED ORDER — HYDROCODONE-ACETAMINOPHEN 10-325 MG PO TABS
1.0000 | ORAL_TABLET | ORAL | Status: DC | PRN
  Administered 2019-08-02 – 2019-08-10 (×20): 1 via ORAL
  Filled 2019-08-02 (×22): qty 1

## 2019-08-02 MED ORDER — VITAMIN B-12 1000 MCG PO TABS
1000.0000 ug | ORAL_TABLET | Freq: Every day | ORAL | Status: DC
  Administered 2019-08-03 – 2019-08-10 (×8): 1000 ug via ORAL
  Filled 2019-08-02 (×8): qty 1

## 2019-08-02 MED ORDER — MORPHINE SULFATE ER 15 MG PO TBCR
15.0000 mg | EXTENDED_RELEASE_TABLET | Freq: Two times a day (BID) | ORAL | Status: DC
  Administered 2019-08-02 – 2019-08-07 (×10): 15 mg via ORAL
  Filled 2019-08-02 (×10): qty 1

## 2019-08-02 MED ORDER — SENNA 8.6 MG PO TABS
1.0000 | ORAL_TABLET | Freq: Two times a day (BID) | ORAL | Status: DC
  Administered 2019-08-02 – 2019-08-05 (×6): 8.6 mg via ORAL
  Filled 2019-08-02 (×6): qty 1

## 2019-08-02 MED ORDER — ANASTROZOLE 1 MG PO TABS
1.0000 mg | ORAL_TABLET | Freq: Every day | ORAL | Status: DC
  Administered 2019-08-03 – 2019-08-10 (×8): 1 mg via ORAL
  Filled 2019-08-02 (×8): qty 1

## 2019-08-02 MED ORDER — ACETAMINOPHEN 325 MG PO TABS
650.0000 mg | ORAL_TABLET | ORAL | Status: DC | PRN
  Administered 2019-08-08: 650 mg via ORAL
  Filled 2019-08-02: qty 2

## 2019-08-02 MED ORDER — DIPHENHYDRAMINE HCL 25 MG PO CAPS
25.0000 mg | ORAL_CAPSULE | Freq: Four times a day (QID) | ORAL | Status: DC | PRN
  Administered 2019-08-03: 25 mg via ORAL
  Filled 2019-08-02: qty 1

## 2019-08-02 MED ORDER — PRIMIDONE 50 MG PO TABS
50.0000 mg | ORAL_TABLET | Freq: Every day | ORAL | Status: DC
  Administered 2019-08-02 – 2019-08-09 (×8): 50 mg via ORAL
  Filled 2019-08-02 (×10): qty 1

## 2019-08-02 MED ORDER — MAGNESIUM GLUCONATE 500 MG PO TABS
1000.0000 mg | ORAL_TABLET | Freq: Every day | ORAL | Status: DC
  Administered 2019-08-02 – 2019-08-09 (×8): 1000 mg via ORAL
  Filled 2019-08-02 (×10): qty 2

## 2019-08-02 MED ORDER — ACETAMINOPHEN 650 MG RE SUPP: 650.0000 mg | RECTAL | Status: DC | PRN

## 2019-08-02 MED ORDER — ROPINIROLE HCL 0.5 MG PO TABS
0.7500 mg | ORAL_TABLET | Freq: Every day | ORAL | Status: DC
  Administered 2019-08-02 – 2019-08-09 (×8): 0.75 mg via ORAL
  Filled 2019-08-02 (×10): qty 1

## 2019-08-02 MED ORDER — ALLOPURINOL 300 MG PO TABS
300.0000 mg | ORAL_TABLET | Freq: Every day | ORAL | Status: DC
  Administered 2019-08-03 – 2019-08-10 (×8): 300 mg via ORAL
  Filled 2019-08-02 (×8): qty 1

## 2019-08-02 MED ORDER — LEVOTHYROXINE SODIUM 75 MCG PO TABS
75.0000 ug | ORAL_TABLET | Freq: Every day | ORAL | Status: DC
  Administered 2019-08-03 – 2019-08-10 (×8): 75 ug via ORAL
  Filled 2019-08-02 (×8): qty 1

## 2019-08-02 MED ORDER — HYDROXYZINE HCL 25 MG PO TABS
25.0000 mg | ORAL_TABLET | Freq: Four times a day (QID) | ORAL | Status: DC | PRN
  Filled 2019-08-02: qty 1

## 2019-08-02 MED ORDER — DIAZEPAM 5 MG PO TABS
5.0000 mg | ORAL_TABLET | Freq: Four times a day (QID) | ORAL | Status: DC | PRN
  Administered 2019-08-02 – 2019-08-09 (×9): 5 mg via ORAL
  Filled 2019-08-02 (×10): qty 1

## 2019-08-02 NOTE — PMR Pre-admission (Signed)
PMR Admission Coordinator Pre-Admission Assessment  Patient: Brandi Stephens is an 74 y.o., female MRN: [id number] DOB: [birth year] Height: '5\' 8"'  (172.7 cm) Weight: 115.7 kg  Insurance Information HMO:     PPO: yes     PCP:      IPA:      80/20:      OTHER:  PRIMARY: [employer]      Policy#: [id number]      Subscriber: pt CM Name: Leretha Dykes      Phone#: [phone]     Fax#: EPIC access  Pre-Cert#: [id number] auth for CIR admission provided by Stefanie for 7 days      Employer: n/a Benefits:  Phone #: [phone]     Name:  Eff. Date: 04/19/19     Deduct: $0      Out of Pocket Max: $3400 (met $295)      Life Max: n/a CIR: $295/day for days 1-6      SNF: $50/day for days 1-20 Outpatient:      Co-Pay: $15/visit Home Health: 100%      Co-Pay:  DME: 80%     Co-Pay: 20% Providers: preferred network  SECONDARY:       Policy#:      Phone#:   Development worker, community:       Phone#:   The Engineer, petroleum" for patients in Inpatient Rehabilitation Facilities with attached "Privacy Act [place] Records" was provided and verbally reviewed with: Patient and Family  Emergency Contact Information Contact Information    Name Relation Home Work Mobile   [place] Daughter [phone]     Nasiah, Polinsky [phone]  [phone]   Relda, Agosto Daughter   [phone]      Current Medical History  Patient Admitting Diagnosis: s/p removal of hardware and extension of lumbar fusion to L2-S1 History of Present Illness: [name] is a 74 year old right-handed female with history of hypertension, 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 Jana Hakim, hypothyroidism, COPD and quit smoking approximately 13 months ago, tremors as well as restless leg syndrome, chronic constipation, chronic kidney disease stage III, lumbar spinal stenosis post recent decompression and fusion 05/30/2019 per Dr. Kathyrn Sheriff  discharge to home supervision level with rolling walker.    Patient presented 06/25/2019 with increasing low back pain with radiation and weakness in the left leg.  Admission chemistries hemoglobin 9.7, WBC 12,700, BUN 70, creatinine 2.83, sodium 133, SARS coronavirus negative, ESR 136, lactic acid 0.9.  CT renal study negative for hydronephrosis or ureteral stone.  Blood cultures 1/4 blood cultures with GPC likely contaminant.  MRI lumbar spine showed postoperative changes from recent extension of posterior decompression and fusion L2-3 level.  Persistent severe stenosis at the level of L2-3 with question circumferential epidural collection.  Approximate 5 x 4 cm x 7.4 cm heterogeneous collection within the posterior paraspinous soft tissue extending from L3-L5-S1.  Follow-up neurosurgery Dr. Kathyrn Sheriff regards to latest back surgery no current plan for intervention at that time she was started on Decadron.  Renal function improved with gentle IV fluids.  Her hospital course was complicated by steroid-induced hyperglycemia.  Pain management initially maintained with MS Contin.  She was admitted to inpatient rehab services 06/28/2019 with slow progressive gains ongoing issues with regards to pain management.  She was ambulating distances of 200 feet assistive device needing multiple rest breaks ongoing complaints of back pain with followed by neurosurgery.  CT of abdomen completed due to bouts of constipation  showed no bowel obstruction however there was concerns of interval posterior slippage of the disc space at L2-3 as well as small paraspinal abscess L2-3.  Films were reviewed by neurosurgery recommendations for interventional radiology for aspiration of joint L2-3 and infectious disease consulted for antibiotic care.  Due to patient's ongoing limitations physically as well as work-up for abscess she was transferred to acute care services 07/12/2019.  Patient had been started on IV vancomycin as well as cefepime per  infectious disease.  Cultures grew MS coag negative staph and recommendations were for 6 weeks of antibiotic care with cefazolin x6 weeks and then consider transitioning to oral antibiotics.  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 Kathyrn Sheriff.  Lumbar corset maintained applied in sitting position.  Maintained on Lovenox for DVT prophylaxis.  Pain control ongoing with MS Contin 15 mg every 12 hours as well as oxycodone for breakthrough pain.  Therapy evaluations have been completed postoperatively and patient was recommended for a comprehensive rehab program.    Patient's medical record from [facility] has been reviewed by the rehabilitation admission coordinator and physician.  Past Medical History  Past Medical History:  Diagnosis Date  . AIN (anal intraepithelial neoplasia) anal canal   . Anemia    with pregnancy  . Anxiety   . Arthritis    knees, lower back, knees  . Chronic constipation   . Chronic diastolic CHF (congestive heart failure) ([place]) 07/05/2019  . CKD (chronic kidney disease)   . Coarse tremors    Essential  . COPD (chronic obstructive pulmonary disease) ([place])    2017 chest CT, history of  . Degenerative lumbar spinal stenosis   . Depression   . Depression 07/05/2019  . Drug dependency ([place])   . Elevated PTHrP level 05/09/2019  . ETOH abuse 07/05/2019  . GERD (gastroesophageal reflux disease)    pt denies  . Gout   . Grade I diastolic dysfunction 46/28/6381   Noted ECHO  . Hemorrhoids   . History of adenomatous polyp of colon   . History of basal cell carcinoma (BCC) excision 2015   nose  . History of sepsis 05/14/2014    post lumbar surgery  . History of shingles    x2  . Hyperlipidemia   . Hypertension   . Hypothyroidism   . Irregular heart beat    history of while undergoing radiation  . Malignant neoplasm of upper-inner quadrant of left breast in female, estrogen receptor positive [facility]) oncologist-  dr Jana Hakim--  per lov in epic no recurrence   dx 07-13-2015  Left breast invasive ductal carcinoma, Stage IA, Grade 1 (TXN0),  09-16-2015  s/p  left breast lumpectomy with sln bx's,  completed radiation 11-18-2015,  started antiestrogen therapy 12-14-2015  . Neuropathy    Left foot and back of left leg  . Obesity (BMI 30-39.9) 07/05/2019  . Personal history of radiation therapy    completed 11-18-2015  left breast  . Pneumonia   . PONV (postoperative nausea and vomiting)   . Prediabetes    diet controlled, no med  . Restless leg syndrome   . Seasonal allergies   . Seizure ([place]) 05/2015   due to sepsis, only one time episode  .  Tremor   . Wears partial dentures    lower     Family History   family history includes Atrial fibrillation in her mother; Brain cancer in her maternal aunt; Breast cancer in her maternal aunt; Lung cancer in her maternal aunt and maternal uncle; Prostate cancer in her maternal grandfather; Stomach cancer in her maternal grandmother; Stroke in her paternal grandmother; Tuberculosis in her paternal grandfather; Ulcers in her father.  Prior Rehab/Hospitalizations Has the patient had prior rehab or hospitalizations prior to admission? Yes  Has the patient had major surgery during 100 days prior to admission? Yes   Current Medications  Current Facility-Administered Medications:  .  0.9 %  sodium chloride infusion (Manually program via Guardrails IV Fluids), , Intravenous, Once, Barrington Ellison, CRNA .  0.9 %  sodium chloride infusion, , Intravenous, Continuous, Costella, Vincent [name], PA-C, Last Rate: 10 mL/hr at 07/30/19 0359, New Bag at 07/30/19 0359 .  0.9 %  sodium chloride  infusion, 250 mL, Intravenous, Continuous, Costella, Vincent J, PA-C, Last Rate: 1 mL/hr at 07/24/19 2301, 250 mL at 07/24/19 2301 .  acetaminophen (TYLENOL) tablet 650 mg, 650 mg, Oral, Q4H PRN **OR** acetaminophen (TYLENOL) suppository 650 mg, 650 mg, Rectal, Q4H PRN, Costella, Vincent J, PA-C .  allopurinol (ZYLOPRIM) tablet 300 mg, 300 mg, Oral, Daily, Costella, Vincent J, PA-C, 300 mg at 08/02/19 0934 .  anastrozole (ARIMIDEX) tablet 1 mg, 1 mg, Oral, Daily, Costella, Vincent J, PA-C, 1 mg at 08/02/19 0934 .  atorvastatin (LIPITOR) tablet 20 mg, 20 mg, Oral, Daily, Costella, Vincent J, PA-C, 20 mg at 08/02/19 0934 .  bisacodyl (DULCOLAX) suppository 10 mg, 10 mg, Rectal, Daily PRN, Costella, Vincent J, PA-C, 10 mg at 07/31/19 1121 .  bupivacaine liposome (EXPAREL) 1.3 % injection 266 mg, 20 mL, Infiltration, Once, [name], MD .  ceFAZolin (ANCEF) IVPB 2g/100 mL premix, 2 g, Intravenous, Q8H, Nundkumar, Nena Polio, MD, Last Rate: 200 mL/hr at 08/02/19 0938, 2 g at 08/02/19 0938 .  Chlorhexidine Gluconate Cloth 2 % PADS 6 each, 6 each, Topical, Daily, [name], MD, 6 each at 08/01/19 1030 .  diazepam (VALIUM) tablet 5 mg, 5 mg, Oral, Q6H PRN, Viona Gilmore D, NP, 5 mg at 08/02/19 0537 .  diphenhydrAMINE (BENADRYL) capsule 25 mg, 25 mg, Oral, Q6H PRN, Costella, Vincent J, PA-C, 25 mg at 08/02/19 0533 .  docusate sodium (COLACE) capsule 100 mg, 100 mg, Oral, TID, Costella, Vincent J, PA-C, 100 mg at 08/02/19 0934 .  DULoxetine (CYMBALTA) DR capsule 60 mg, 60 mg, Oral, Daily, Costella, Vincent J, PA-C, 60 mg at 08/02/19 0934 .  enoxaparin (LOVENOX) injection 40 mg, 40 mg, Subcutaneous, Q24H, Vallarie Mare, MD, 40 mg at 08/02/19 0934 .  HYDROcodone-acetaminophen (NORCO/VICODIN) 5-325 MG per tablet 1 tablet, 1 tablet, Oral, Q4H PRN, Costella, Vista Mink, PA-C, 1 tablet at 08/01/19 1439 .  HYDROmorphone (DILAUDID) injection 0.5-1 mg, 0.5-1 mg, Intravenous, Q2H PRN, Costella,  Vincent J, PA-C, 1 mg at 07/29/19 1618 .  hydrOXYzine (ATARAX/VISTARIL) tablet 25 mg, 25 mg, Oral, Q6H PRN, Costella, Vincent J, PA-C, 25 mg at 08/02/19 0533 .  levothyroxine (SYNTHROID) tablet 75 mcg, 75 mcg, Oral, QAC breakfast, Costella, Vincent [name], PA-C, 75 mcg at 08/02/19 0534 .  magnesium gluconate (MAGONATE) tablet 1,000 mg, 1,000 mg, Oral, QHS, Costella, Vincent J, PA-C, 1,000 mg at 08/01/19 2203 .  menthol-cetylpyridinium (CEPACOL) lozenge 3 mg, 1 lozenge, Oral, PRN **OR** phenol (CHLORASEPTIC) mouth spray 1 spray, 1 spray, Mouth/Throat, PRN, Costella, Evette Doffing  J, PA-C .  metoprolol succinate (TOPROL-XL) 24 hr tablet 12.5 mg, 12.5 mg, Oral, Daily, Costella, Vincent J, PA-C, 12.5 mg at 08/02/19 0934 .  morphine (MS CONTIN) 12 hr tablet 15 mg, 15 mg, Oral, Q12H, Costella, Vincent J, PA-C, 15 mg at 08/02/19 0934 .  ondansetron (ZOFRAN) tablet 4 mg, 4 mg, Oral, Q6H PRN **OR** ondansetron (ZOFRAN) injection 4 mg, 4 mg, Intravenous, Q6H PRN, Costella, Vincent J, PA-C, 4 mg at 07/31/19 1338 .  oxyCODONE (Oxy IR/ROXICODONE) immediate release tablet 5 mg, 5 mg, Oral, Q4H PRN, Costella, Vincent J, PA-C, 5 mg at 07/29/19 0531 .  oxyCODONE (Oxy IR/ROXICODONE) immediate release tablet 5-10 mg, 5-10 mg, Oral, Q3H PRN, Costella, Vincent J, PA-C, 10 mg at 08/02/19 0535 .  polyethylene glycol (MIRALAX / GLYCOLAX) packet 17 g, 17 g, Oral, Daily PRN, Costella, Vincent J, PA-C, 17 g at 07/31/19 0935 .  primidone (MYSOLINE) tablet 50 mg, 50 mg, Oral, QHS, Costella, Vincent J, PA-C, 50 mg at 08/01/19 2203 .  QUEtiapine (SEROQUEL) tablet 25 mg, 25 mg, Oral, QHS, Costella, Vincent J, PA-C, 25 mg at 08/01/19 2202 .  rOPINIRole (REQUIP) tablet 0.75 mg, 0.75 mg, Oral, QHS, Costella, Vincent J, PA-C, 0.75 mg at 08/01/19 2204 .  senna (SENOKOT) tablet 8.6 mg, 1 tablet, Oral, BID, Costella, Vincent J, PA-C, 8.6 mg at 08/02/19 0934 .  senna-docusate (Senokot-S) tablet 1 tablet, 1 tablet, Oral, QHS PRN, Costella, Vista Mink,  PA-C, 1 tablet at 07/16/19 1722 .  sodium chloride flush (NS) 0.9 % injection 10-40 mL, 10-40 mL, Intracatheter, Q12H, [name], MD, 10 mL at 08/02/19 0940 .  sodium chloride flush (NS) 0.9 % injection 10-40 mL, 10-40 mL, Intracatheter, PRN, [name], MD .  sodium chloride flush (NS) 0.9 % injection 3 mL, 3 mL, Intravenous, Q12H, Costella, Vincent J, PA-C, 3 mL at 08/02/19 0940 .  sodium chloride flush (NS) 0.9 % injection 3 mL, 3 mL, Intravenous, PRN, Costella, Vincent J, PA-C, 3 mL at 07/31/19 2128 .  sodium phosphate (FLEET) 7-19 GM/118ML enema 1 enema, 1 enema, Rectal, Once PRN, Costella, Vista Mink, PA-C .  vitamin B-12 (CYANOCOBALAMIN) tablet 1,000 mcg, 1,000 mcg, Oral, Daily, Costella, Vincent J, PA-C, 1,000 mcg at 08/02/19 2876  Patients Current Diet:  Diet Order            Diet Heart Room service appropriate? Yes; Fluid consistency: Thin  Diet effective now              Precautions / Restrictions Precautions Precautions: Back, Fall Precaution Booklet Issued: Yes (comment) Precaution Comments: pt able to repeat 3/3 back precautions and correctly report brace use Spinal Brace: Lumbar corset, Applied in sitting position Restrictions Weight Bearing Restrictions: No   Has the patient had 2 or more falls or a fall with injury in the past year? Yes  Prior Activity Level Limited Community (1-2x/wk): had been more limited since recent surgery, though typically still drives and ambulates without an AD  Prior Functional Level Self Care: Did the patient need help bathing, dressing, using the toilet or eating? Independent  Indoor Mobility: Did the patient need assistance with walking from room to room (with or without device)? Independent  Stairs: Did the patient need assistance with internal or external stairs (with or without device)? Independent  Functional Cognition: Did the patient need help planning regular tasks such as shopping or remembering to take  medications? [place] / Equipment Home Assistive Devices/Equipment: Gilford Rile (specify type) Home Equipment: Gilford Rile -  2 wheels, Cane - single point, Grab bars - tub/shower, Environmental consultant - 4 wheels, Bedside commode, Shower seat, Tub bench(has ordered bed rail to attach to adjustable bed; bidet)  Prior Device Use: Indicate devices/aids used by the patient prior to current illness, exacerbation or injury? Walker  Current Functional Level Cognition  Overall Cognitive Status: Impaired/Different from baseline Current Attention Level: Sustained Orientation Level: Oriented X4 General Comments: Pt much  better oriented, I would consider being one day off normal for how long she has been here.  Reminded her the calendar is on the wall if she needs to re-orient herself throughout the day.    Extremity Assessment (includes Sensation/Coordination)  Upper Extremity Assessment: Overall WFL for tasks assessed  Lower Extremity Assessment: Defer to PT evaluation    ADLs  Overall ADL's : Needs assistance/impaired Eating/Feeding: Set up, Bed level Eating/Feeding Details (indicate cue type and reason): Pt continues to be in so much pain she was unable to sit up long enough to feed herself. Grooming: Set up, Supervision/safety, Sitting Grooming Details (indicate cue type and reason): seated on BSC at sink in bathroom Upper Body Bathing: Minimal assistance, Bed level Upper Body Bathing Details (indicate cue type and reason): Pt unable to tolerate sitting long enough to bathe Lower Body Bathing: Maximal assistance, Sit to/from stand Lower Body Bathing Details (indicate cue type and reason): Pt in too much pain to attempt much LE dressing. Upper Body Dressing : Minimal assistance, Sitting Upper Body Dressing Details (indicate cue type and reason): for donning/doffing lumbar brace Lower Body Dressing: Maximal assistance, Sit to/from stand Lower Body Dressing Details (indicate cue type and  reason): requires assist to don socks at bed level today Toilet Transfer: Stand-pivot, BSC, RW, Minimal assistance Toilet Transfer Details (indicate cue type and reason): min assist for steading as pt nauseaous. Toileting- Clothing Manipulation and Hygiene: Moderate assistance, Sitting/lateral lean Toileting - Clothing Manipulation Details (indicate cue type and reason): Pt has difficulty reaching to clean self due to pain. Functional mobility during ADLs: Minimal assistance, Rolling walker General ADL Comments: Pt very limited by back pain.  Only walked a few steps.  Pt also with N/V.    Mobility  Overal bed mobility: Needs Assistance Bed Mobility: Rolling, Sidelying to Sit, Sit to Sidelying Rolling: Supervision Sidelying to sit: Supervision Sit to supine: Min assist Sit to sidelying: Mod assist General bed mobility comments: Pt does better rolling to the right due to left hip pain when she rolls to the left. She reports she gets out on the right at home.  We practiced to the left as she was turned that way and it was closer to her initial destination of the bathroom.  Heavy reliance on rails for rolling and transitions to and from sitting to side lying.  Assist needed to lift both feet back into bed.     Transfers  Overall transfer level: Needs assistance Equipment used: Rolling walker (2 wheeled) Transfers: Sit to/from Stand Sit to Stand: Min assist Stand pivot transfers: Min assist General transfer comment: Min assist to power up to standing and stabilize RW.  Not yet able to push up from bed, so RW needs stabilizing so she can come up holding the walker, Does better from elevated BSC with grab bars in bathroom.  Total assist for pericare and donning of shoes prior to gait.     Ambulation / Gait / Stairs / Wheelchair Mobility  Ambulation/Gait Ambulation/Gait assistance: Herbalist (Feet): 65 Feet(x2 with 3 min seated rest)  Assistive device: Rolling walker (2  wheeled) Gait Pattern/deviations: Step-through pattern, Shuffle General Gait Details: PT continues to have shuffling gait, when legs get weak, she sits and rests for a few mins and then is able to continue.  Only one seated rest today and bil knees not as bouncy towards the end of gait as they were last time I saw [name].  Gait velocity: decreased Gait velocity interpretation: <1.8 ft/sec, indicate of risk for recurrent falls    Posture / Balance Static Sitting Balance Static Sitting - Level of Assistance: 6: Modified independent (Device/Increase time) Dynamic Sitting Balance Dynamic Sitting - Level of Assistance: 5: Stand by assistance Sitting balance - Comments:   Static Standing Balance Static Standing - Level of Assistance: 4: Min assist Dynamic Standing Balance Dynamic Standing - Level of Assistance: 4: Min assist Balance Overall balance assessment: Needs assistance Sitting-balance support: Feet supported, Bilateral upper extremity supported Sitting balance-Leahy Scale: Fair Sitting balance - Comments:   Standing balance support: Bilateral upper extremity supported Standing balance-Leahy Scale: Poor Standing balance comment: reliant on BUE for ambulaiton, able to pull up underwear without any UE support or LOB as long as they are treaded high enough initially so she doesn't have to flex forward, still needs assist for pericare with spinal precautions    Special needs/care consideration Skin incision to lumbar area, Diabetic management yes and Designated visitor [name] and [place] (from acute therapy documentation) Living Arrangements: Spouse/significant other, Children  Lives With: Spouse Available Help at Discharge: Family, Available 24 hours/day Type of Home: House Home Layout: Two level, Bed/bath upstairs Alternate Level Stairs-Rails: Can reach both Alternate Level Stairs-Number of Steps: 14 Home Access: Stairs to enter Entrance Stairs-Rails:  None Entrance Stairs-Number of Steps: 3 Bathroom Shower/Tub: Gaffer, Chiropodist: Handicapped height Bathroom Accessibility: Yes Home Care Services: No Additional Comments: patient owns tub bench but was standing in the shower prior to this admission with assistance of daughter  Discharge Living Setting Plans for Discharge Living Setting: Patient's home Type of Home at Discharge: House Discharge Home Layout: Two level, Bed/bath upstairs Alternate Level Stairs-Rails: Can reach both Alternate Level Stairs-Number of Steps: 14 Discharge Home Access: Stairs to enter Entrance Stairs-Rails: None Entrance Stairs-Number of Steps: 2 Discharge Bathroom Shower/Tub: Tub/shower unit Discharge Bathroom Toilet: Standard Discharge Bathroom Accessibility: Yes How Accessible: Accessible via walker Does the patient have any problems obtaining your medications?: No  Social/Family/Support Systems Patient Roles: Spouse, Parent Anticipated Caregiver: Conservation officer, historic buildings (dtr) and Herbie [place] (spouse) Anticipated Caregiver's Contact Information: [name] [phone] Ability/Limitations of Caregiver: sup mobility, min adls Caregiver Availability: 24/7 Discharge Plan Discussed with Primary Caregiver: Yes Is Caregiver In Agreement with Plan?: Yes Does Caregiver/Family have Issues with Lodging/Transportation while Pt is in Rehab?: No  Goals Patient/Family Goal for Rehab: PT/OT mod I Expected length of stay: 7-10 days Additional Information: significant improvement in pain since re-admit to acute Pt/Family Agrees to Admission and willing to participate: Yes Program Orientation Provided & Reviewed with Pt/Caregiver Including Roles  & Responsibilities: Yes  Decrease burden of Care through IP rehab admission: n/a  Possible need for SNF placement upon discharge: Not anticipated  Patient Condition: I have reviewed medical records from [facility], spoken with CM, and  patient and daughter. I met with patient at the bedside for inpatient rehabilitation assessment.  Patient will benefit from ongoing PT and OT, can actively participate in 3 hours of therapy a day 5 days of the week, and can  make measurable gains during the admission.  Patient will also benefit from the coordinated team approach during an Inpatient Acute Rehabilitation admission.  The patient will receive intensive therapy as well as Rehabilitation physician, nursing, social worker, and care management interventions.  Due to safety, skin/wound care, disease management, medication administration, pain management and patient education the patient requires 24 hour a day rehabilitation nursing.  The patient is currently min with mobility and basic ADLs.  Discharge setting and therapy post discharge at home is anticipated.  Patient has agreed to participate in the Acute Inpatient Rehabilitation Program and will admit today.  Preadmission Screen Completed By:  Michel Santee, PT, DPT 08/02/2019 10:14 AM ______________________________________________________________________   Discussed status with Dr. Posey Pronto on 08/02/19  at 10:22 AM  and received approval for admission today.  Admission Coordinator:  Michel Santee, PT, DPT time 10:22 AM Sudie Grumbling 08/02/19    Assessment/Plan: Diagnosis: Debility 1. Does the need for close, 24 hr/day Medical supervision in concert with the patient's rehab needs make it unreasonable for this patient to be served in a less intensive setting? Yes 2. 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 Due to bladder management, bowel management, safety, skin/wound care, disease management, pain management and patient education, does the patient  require 24 hr/day rehab nursing? Yes 4. Does the patient require coordinated care of a physician, rehab nurse, PT, OT to address physical and functional deficits in the context of the above medical diagnosis(es)? Yes Addressing deficits in the following areas: balance, endurance, locomotion, strength, transferring, bowel/bladder control, bathing, dressing, toileting and psychosocial support 5. Can the patient actively participate in an intensive therapy program of at least 3 hrs of therapy 5 days a week? Yes 6. The potential for patient to make measurable gains while on inpatient rehab is excellent 7. Anticipated functional outcomes upon discharge from inpatient rehab: supervision PT, supervision OT, n/a SLP 8. Estimated rehab length of stay to reach the above functional goals is: 10-14 days. 9. Anticipated discharge destination: Home 10. Overall Rehab/Functional Prognosis: good   MD Signature: Delice Lesch, MD, ABPMR

## 2019-08-02 NOTE — H&P (Signed)
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 Jana Hakim, hypothyroidism, COPD and quit smoking approximately 13 months ago, tremors as well as restless leg syndrome, chronic constipation, chronic kidney disease stage III, lumbar spinal stenosis post recent decompression and fusion 05/30/2019 per Dr. Kathyrn Sheriff discharge to home supervision level with rolling walker.  History taken from chart review and patient.  Patient lives with spouse.  Two-level home 2 steps to entry.  Reports by family since recent back surgery was limited ambulation secondary to mostly staying in bed requiring assistance for bathing and dressing.  Patient presented 06/25/2019 with increasing low back pain with radiation and weakness in the left leg.  Admission chemistries hemoglobin 9.7, WBC 12,700, BUN 70, creatinine 2.83, sodium 133, SARS coronavirus negative, ESR 136, lactic acid 0.9.  CT renal study negative for hydronephrosis or ureteral stone.  Blood cultures 1/4 blood cultures with GPC likely contaminant.  MRI lumbar spine showed postoperative changes from recent extension of posterior decompression and fusion L2-3 level, persistent severe stenosis at the level of L2-3 with question circumferential epidural collection.  Approximate 5 x 4 cm x 7.4 cm heterogeneous collection within the posterior paraspinous soft tissue extending from L3-L5-S1.  Follow-up neurosurgery Dr. Kathyrn Sheriff regards to latest back surgery no current plan for intervention at that time she was started on Decadron.  Renal function improved with gentle IV fluids.  Her hospital course was complicated by steroid-induced hyperglycemia.  Pain management initially maintained with MS Contin.  She was admitted to inpatient rehab services 06/28/2019 with slow progressive gains ongoing issues  with regards to pain management.  She was ambulating distances of 200 feet assistive device needing multiple rest breaks ongoing complaints of back pain with followed by neurosurgery.  CT of abdomen completed due to bouts of constipation showed no bowel obstruction however there was concerns of interval posterior slippage of the disc space at L2-3 as well as small paraspinal abscess L2-3.  Films were reviewed by neurosurgery recommendations for interventional radiology for aspiration of joint L2-3 and infectious disease consulted for antibiotic care.  Due to patient's ongoing limitations physically as well as work-up for abscess she was transferred to acute care services 07/12/2018 patient had been started on IV vancomycin as well as cefepime per infectious disease.  Cultures grew MS coag negative staph and recommendations were for 6 weeks of antibiotic care with cefazolin x6 weeks and then consider transitioning to oral antibiotics.  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 Kathyrn Sheriff.  Lumbar corset maintained applied in sitting position.  Maintained on Lovenox for DVT prophylaxis.  Pain control ongoing with MS Contin 15 mg every 12 hours as well as oxycodone for breakthrough pain.  Therapy evaluations have been completed postoperatively and patient is admitted for a comprehensive rehab program.  Please see preadmission assessment from earlier today as well.  Review of Systems  Constitutional: Negative for chills and fever.  HENT: Negative for hearing loss.   Eyes: Negative for blurred vision and double vision.  Respiratory: Negative for cough and shortness of breath.  Cardiovascular: Negative for chest pain and palpitations.  Gastrointestinal: Positive for constipation. Negative for heartburn and nausea.       Chronic constipation, GERD  Genitourinary: Positive for urgency. Negative for dysuria, flank pain and hematuria.  Musculoskeletal: Positive for back pain, joint pain and myalgias.  Skin: Negative for rash.  Neurological: Positive for tremors, sensory change, focal weakness and weakness.  Psychiatric/Behavioral: Positive for depression. The patient has insomnia.        Anxiety   Past Medical History:  Diagnosis Date  . AIN (anal intraepithelial neoplasia) anal canal   . Anemia    with pregnancy  . Anxiety   . Arthritis    knees, lower back, knees  . Chronic constipation   . Chronic diastolic CHF (congestive heart failure) ([place]) 07/05/2019  . CKD (chronic kidney disease)   . Coarse tremors    Essential  . COPD (chronic obstructive pulmonary disease) ([place])    2017 chest CT, history of  . Degenerative lumbar spinal stenosis   . Depression   . Depression 07/05/2019  . Drug dependency ([place])   . Elevated PTHrP level 05/09/2019  . ETOH abuse 07/05/2019  . GERD (gastroesophageal reflux disease)    pt denies  . Gout   . Grade I diastolic dysfunction 82/99/3716   Noted ECHO  . Hemorrhoids   . History of adenomatous polyp of colon   . History of basal cell carcinoma (BCC) excision 2015   nose  . History of sepsis 05/14/2014   post lumbar surgery  . History of shingles    x2  . Hyperlipidemia   . Hypertension   . Hypothyroidism   . Irregular heart beat    history of while undergoing radiation  . Malignant neoplasm of upper-inner quadrant of left breast in female, estrogen receptor positive [facility]) oncologist-  dr Jana Hakim--  per lov in epic no recurrence   dx 07-13-2015  Left breast invasive ductal carcinoma, Stage IA, Grade 1 (TXN0),  09-16-2015  s/p  left breast lumpectomy with sln bx's,  completed radiation 11-18-2015,  started  antiestrogen therapy 12-14-2015  . Neuropathy    Left foot and back of left leg  . Obesity (BMI 30-39.9) 07/05/2019  . Personal history of radiation therapy    completed 11-18-2015  left breast  . Pneumonia   . PONV (postoperative nausea and vomiting)   . Prediabetes    diet controlled, no med  . Restless leg syndrome   . Seasonal allergies   . Seizure ([place]) 05/2015   due to sepsis, only one time episode  . Tremor   . Wears partial dentures    lower    Past Surgical History:  Procedure Laterality Date  . ABDOMINAL HYSTERECTOMY  1985  . BASAL CELL CARCINOMA EXCISION  10/15   nose  . BREAST BIOPSY Right 08/24/2015  . BREAST BIOPSY Right 07/13/2015  . BREAST BIOPSY Left 07/13/2015  . BREAST EXCISIONAL BIOPSY Left 1972  . BREAST LUMPECTOMY Left   . BREAST LUMPECTOMY WITH RADIOACTIVE SEED AND SENTINEL LYMPH NODE BIOPSY Left 09/16/2015   Procedure: BREAST LUMPECTOMY WITH RADIOACTIVE SEED AND SENTINEL LYMPH NODE BIOPSY;  Surgeon: Stark Klein, MD;  Location: [facility];  Service: General;  Laterality: Left;  . BREAST REDUCTION SURGERY Bilateral 1998  . CATARACT EXTRACTION W/ INTRAOCULAR LENS IMPLANT Left 2016  . [place]   hallo  . COLONOSCOPY  01/2013   polyps  . EYE SURGERY Left 2016  . HAND SURGERY Left  02-19-2002   dr Fredna Dow  '@[email or web address]'    repair collateral ligament/ MPJ left thumb  . HARDWARE REMOVAL N/A 07/24/2019   Procedure: REVISION OF HARDWARE Lumbar two - Sacral one. Extention of Fusion to Lumbar one;  Surgeon: [name], MD;  Location: [facility];  Service: Neurosurgery;  Laterality: N/A;  . HEMORRHOID SURGERY  2008  . HIGH RESOLUTION ANOSCOPY N/A 02/28/2018   Procedure: HIGH RESOLUTION ANOSCOPY WITH BIOPSY;  Surgeon: Leighton Ruff, MD;  Location: [facility];  Service: General;  Laterality: N/A;  . KNEE ARTHROSCOPY Right 2002  . LUMBAR SPINE SURGERY  03-20-2015   dr Kathyrn Sheriff   fusion L4-5, L5-S1  . MOUTH SURGERY   07/14/2018   2 infected teeth removed  . MULTIPLE TOOTH EXTRACTIONS     with bone grafting lower bottom right and left  . REDUCTION MAMMAPLASTY Bilateral   . RIGHT COLECTOMY  09-10-2003   dr Margot Chimes '@[email or web address]'    multiple colon polyps (per path tubular adenoma's, hyperplastic , benign appendix, benign two lymph nodes  . ROTATOR CUFF REPAIR Left 04/2016  . TONSILLECTOMY  1969  . TUBAL LIGATION Bilateral 1982   Family History  Problem Relation Age of Onset  . Atrial fibrillation Mother   . Ulcers Father   . Breast cancer Maternal Aunt   . Lung cancer Maternal Uncle   . Stomach cancer Maternal Grandmother   . Lung cancer Maternal Aunt   . Brain cancer Maternal Aunt   . Prostate cancer Maternal Grandfather   . Stroke Paternal Grandmother   . Tuberculosis Paternal Grandfather   . Colon cancer Neg Hx   . Esophageal cancer Neg Hx   . Rectal cancer Neg Hx    Social History:  reports that she quit smoking about 14 months ago. Her smoking use included cigarettes. She has a 17.50 pack-year smoking history. She has never used smokeless tobacco. She reports current alcohol use. She reports that she does not use drugs. Allergies:  Allergies  Allergen Reactions  . Ampicillin Hives and Other (See Comments)    Severe reaction in February 2017 1.5 month to have hives to go away  . Penicillins Hives and Other (See Comments)    Severe reaction in February 2017 1.5 month to have hives to go away Has patient had a PCN reaction causing immediate rash, facial/tongue/throat swelling, SOB or lightheadedness with hypotension: #  #  #  YES  #  #  #  Has patient had a PCN reaction causing severe rash involving mucus membranes or skin necrosis: No Has patient had a PCN reaction that required hospitalization No Has patient had a PCN reaction occurring within the last 10 years: No   . Gabapentin Swelling    UNSPECIFIED REACTION   . Other Itching    UNSPECIFIED Analgesics  . Codeine Nausea And Vomiting  .  Hydrocodone-Acetaminophen Itching   Medications Prior to Admission  Medication Sig Dispense Refill  . acetaminophen (TYLENOL) 325 MG tablet Take 2 tablets (650 mg total) by mouth every 6 (six) hours as needed for moderate pain. 20 tablet 0  . albuterol (VENTOLIN HFA) 108 (90 Base) MCG/ACT inhaler INHALE 2 PUFFS BY MOUTH EVERY 4 TO 6 HOURS AS NEEDED 42.5 g 0  . allopurinol (ZYLOPRIM) 300 MG tablet TAKE 1 TABLET(300 MG) BY MOUTH DAILY 90 tablet 0  . ALPRAZolam (XANAX) 0.25 MG tablet Take 1 tablet (0.25 mg total) by mouth 3 (three) times daily as needed for anxiety. 20 tablet 0  . atorvastatin (LIPITOR)  20 MG tablet Take 1 tablet (20 mg total) by mouth daily. 90 tablet 1  . Biotin (BIOTIN 5000) 5 MG CAPS Take 5 mg by mouth daily.     . Calcium Carb-Cholecalciferol (CALCIUM 1000 + D PO) Take 1 tablet by mouth daily.    . colchicine 0.6 MG tablet Take 2 tablets now and 1 tablet an hour later for flare up (Patient taking differently: Take 0.6-1.2 mg by mouth daily as needed (gout). ) 90 tablet 0  . diclofenac Sodium (VOLTAREN) 1 % GEL Apply 4 g topically 4 (four) times daily. 4 g 0  . docusate sodium (COLACE) 100 MG capsule Take 1 capsule (100 mg total) by mouth 3 (three) times daily. 10 capsule 0  . DULoxetine (CYMBALTA) 60 MG capsule TAKE 1 CAPSULE(60 MG) BY MOUTH DAILY 30 capsule 2  . levothyroxine (SYNTHROID) 75 MCG tablet Take 1 tablet (75 mcg total) by mouth daily before breakfast. 90 tablet 1  . magnesium gluconate (MAGONATE) 500 MG tablet Take 2 tablets (1,000 mg total) by mouth at bedtime. 30 tablet 0  . metaxalone (SKELAXIN) 800 MG tablet Take 1 tablet (800 mg total) by mouth 3 (three) times daily. 90 tablet 0  . methocarbamol (ROBAXIN) 500 MG tablet Take 2 tablets (1,000 mg total) by mouth 3 (three) times daily. 90 tablet 0  . metoprolol succinate (TOPROL-XL) 25 MG 24 hr tablet Take 0.5 tablets (12.5 mg total) by mouth daily. 30 tablet 0  . oxyCODONE (OXY IR/ROXICODONE) 5 MG immediate  release tablet Take 1 tablet (5 mg total) by mouth every 4 (four) hours as needed for severe pain. 20 tablet 0  . polyethylene glycol powder (MIRALAX) powder Take 17 g by mouth daily as needed for moderate constipation.     . potassium chloride SA (KLOR-CON) 20 MEQ tablet Take 1 tablet (20 mEq total) by mouth daily.    . primidone (MYSOLINE) 50 MG tablet TAKE 1 TABLET(50 MG) BY MOUTH AT BEDTIME 90 tablet 1  . QUEtiapine (SEROQUEL) 25 MG tablet Take 1 tablet (25 mg total) by mouth at bedtime. 30 tablet 0  . rOPINIRole (REQUIP) 0.25 MG tablet Take 3 tablets (0.75 mg total) by mouth at bedtime. 30 tablet 0  . vitamin B-12 (CYANOCOBALAMIN) 1000 MCG tablet Take 1 tablet (1,000 mcg total) by mouth daily. 30 tablet 0  . melatonin 300 MCG TABS Take 35 tablets (10,500 mcg total) by mouth at bedtime. (Patient not taking: Reported on 07/11/2019) 30 tablet 0  . morphine (MS CONTIN) 15 MG 12 hr tablet Take 1 tablet (15 mg total) by mouth every 12 (twelve) hours. (Patient not taking: Reported on 07/11/2019) 10 tablet 0    Drug Regimen Review Drug regimen was reviewed and remains appropriate with no significant issues identified  Home: Home Living Family/patient expects to be discharged to:: Private residence Living Arrangements: Spouse/significant other, Children Available Help at Discharge: Family, Available 24 hours/day Type of Home: House Home Access: Stairs to enter [employer] of Steps: 3 Entrance Stairs-Rails: None Home Layout: Two level, Bed/bath upstairs Alternate Level Stairs-Number of Steps: 14 Alternate Level Stairs-Rails: Can reach both Bathroom Shower/Tub: Gaffer, Chiropodist: Handicapped height Bathroom Accessibility: Yes Home Equipment: Environmental consultant - 2 wheels, Cane - single point, Grab bars - tub/shower, Environmental consultant - 4 wheels, Bedside commode, Shower seat, Tub bench(has ordered bed rail to attach to adjustable bed; bidet) Additional Comments: patient owns  tub bench but was standing in the shower prior to this admission with assistance  of daughter  Lives With: Spouse   Functional History: Prior Function Level of Independence: Needs assistance Gait / Transfers Assistance Needed: on CIR was walking up to 220 ft; up/down 14 steps; however did fall on the steps her last day on CIR--reports she tried to lead with her weak leg "not thinking" ADL's / Homemaking Assistance Needed: Reports she was working towards performing BADLs at [employer]  Comments: reports wanting to enjoy playing with her 56 y.o. granddaughter and walk outside  Functional Status:  Mobility: Bed Mobility Overal bed mobility: Needs Assistance Bed Mobility: Rolling, Sidelying to Sit, Sit to Sidelying Rolling: Supervision Sidelying to sit: Supervision Sit to supine: Min assist Sit to sidelying: Mod assist General bed mobility comments: Suprvision to log roll to R side of EOB, heavy use of rail, mod assist to help lift both legs back into bed. again, heavy use of rail.  Transfers Overall transfer level: Needs assistance Equipment used: Rolling walker (2 wheeled) Transfers: Sit to/from Stand Sit to Stand: Min assist Stand pivot transfers: Min assist General transfer comment: Min assist from lower bed today, attempted to push up with one hand on bed, but pt unable to do this, so reverted back to PT stabilizing RW and pt pushing straight down through bil RW handles.   Ambulation/Gait Ambulation/Gait assistance: Min assist Gait Distance (Feet): 130 Feet Assistive device: Rolling walker (2 wheeled) Gait Pattern/deviations: Step-through pattern, Shuffle General Gait Details: cues to get inside of RW handles during gait and turning, pt continues to report a bit of vertigo, mainly when looking down, or during transitions from supine to sitting up EOB.  Pt had some fleeting left beating nystagmus during oculomotor assessment, but may need further vestibular assessment for BPPV at rehab.    Gait velocity: decreased Gait velocity interpretation: <1.8 ft/sec, indicate of risk for recurrent falls    ADL: ADL Overall ADL's : Needs assistance/impaired Eating/Feeding: Set up, Bed level Eating/Feeding Details (indicate cue type and reason): Pt continues to be in so much pain she was unable to sit up long enough to feed herself. Grooming: Set up, Supervision/safety, Sitting Grooming Details (indicate cue type and reason): seated on BSC at sink in bathroom Upper Body Bathing: Minimal assistance, Bed level Upper Body Bathing Details (indicate cue type and reason): Pt unable to tolerate sitting long enough to bathe Lower Body Bathing: Maximal assistance, Sit to/from stand Lower Body Bathing Details (indicate cue type and reason): Pt in too much pain to attempt much LE dressing. Upper Body Dressing : Minimal assistance, Sitting Upper Body Dressing Details (indicate cue type and reason): for donning/doffing lumbar brace Lower Body Dressing: Maximal assistance, Sit to/from stand Lower Body Dressing Details (indicate cue type and reason): requires assist to don socks at bed level today Toilet Transfer: Stand-pivot, BSC, RW, Minimal assistance Toilet Transfer Details (indicate cue type and reason): min assist for steading as pt nauseaous. Toileting- Clothing Manipulation and Hygiene: Moderate assistance, Sitting/lateral lean Toileting - Clothing Manipulation Details (indicate cue type and reason): Pt has difficulty reaching to clean self due to pain. Functional mobility during ADLs: Minimal assistance, Rolling walker General ADL Comments: Pt very limited by back pain.  Only walked a few steps.  Pt also with N/V.  Cognition: Cognition Overall Cognitive Status: Impaired/Different from baseline Orientation Level: Oriented X4 Cognition Arousal/Alertness: Awake/alert Behavior During Therapy: WFL for tasks assessed/performed Overall Cognitive Status: Impaired/Different from baseline Area  of Impairment: Memory Orientation Level: Time(just one day off on day/date) Current Attention Level: Sustained Memory: Decreased  short-term memory General Comments: Having a hard time remembering the names of things when ordering lunch today.  Needs re-orientation to day of the week and date (not all that unusual for her long hospitalization).   Physical Exam: Blood pressure (!) 157/74, pulse 90, temperature 98 F (36.7 C), temperature source Oral, resp. rate 15, height '5\' 8"'  (1.727 m), weight 115.7 kg, SpO2 (!) 86 %. Physical Exam  Vitals reviewed. Constitutional: She appears well-developed.  Morbidly obese  HENT:  Head: Normocephalic and atraumatic.  Eyes: EOM are normal. Right eye exhibits no discharge. Left eye exhibits no discharge.  Neck: No tracheal deviation present. No thyromegaly present.  Respiratory: Effort normal. No respiratory distress.  GI: Soft. She exhibits no distension.  Musculoskeletal:     Comments: Lower extremity edema  Neurological: She is alert.  Patient is alert.   Somewhat anxious.   Follows commands oriented x3. Motor: Bilateral upper extremities: 4/5 proximal distal Right lower extremity: 4/5 proximal distal Left lower extremity: 3-3+/5 proximal distal Sensation diminished to light touch left greater than sign right foot, greatest in fourth and fifth digits  Skin: Skin is warm and dry.  Back incision not examined  Psychiatric: She has a normal mood and affect. Her behavior is normal.    No results found for this or any previous visit (from the past 48 hour(s)). No results found.     Medical Problem List and Plan: 1.  Decreased functional mobility secondary to acute on chronic back pain status post recent L2-3 fusion 0/17/7939 complicated by instrumentation failure with postoperative lumbar infection status post removal of previous hardware placement of posterior segmental instrumentation L1-S1 with pedicle screws L1, L2 revision biomechanical  cage 07/24/2019.  Back brace when out of bed  -patient may not shower  -ELOS/Goals: 7-10 days/supervision/min a  Admit to CIR 2.  Antithrombotics: -DVT/anticoagulation: Lovenox.  Most recent vascular study negative  -antiplatelet therapy: N/A 3. Pain Management: MS Contin 15 mg every 12 hours, Cymbalta 60 mg daily, hydrocodone as needed, Valium as needed for muscle spasms,  Monitor with increased exertion 4. Mood: Provide emotional support  -antipsychotic agents: Seroquel 25 mg nightly 5. Neuropsych: This patient is capable of making decisions on her own behalf. 6. Skin/Wound Care: Routine skin checks 7. Fluids/Electrolytes/Nutrition: Routine in and outs.  CMP ordered. 8.  ID/MS coags negative staph.  Continue IV antibiotic Ancef x6 weeks initiated 07/11/2019 9.  Acute on chronic anemia.  CBC ordered. 10.  Hypertension.  Toprol 12.5 mg daily.  Monitor with increased activity. 11.  Chronic constipation.  Continue to adjust bowel program 12.  Hypothyroidism.  Synthroid 13.  History of breast cancer.  Follow-up outpatient Dr. Jana Hakim.Continue Arimidex 14.  History of restless leg syndrome.  Mysoline 50 mg nightly, Requip 0.75 mg nightly 15.  Hyperlipidemia.  Lipitor  Lavon Paganini Angiulli, PA-C  08/02/2019  I have personally performed a face to face diagnostic evaluation, including, but not limited to relevant history and physical exam findings, of this patient and developed relevant assessment and plan.  Additionally, I have reviewed and concur with the physician assistant's documentation above.  Delice Lesch, MD, ABPMR  The patient's status has not changed. The original post admission physician evaluation remains appropriate, and any changes from the pre-admission screening or documentation from the acute chart are noted above.   Delice Lesch, MD, ABPMR

## 2019-08-02 NOTE — Psychosocial Assessment (Signed)
Inpatient Rehab Admissions Coordinator:   I have a bed available for this patient to admit to CIR today.  Approval from [employer], PA-C.  I will let pt/family and CM know.   Shann Medal, PT, DPT Admissions Coordinator [phone] 08/02/19  10:14 AM

## 2019-08-02 NOTE — Care Management Important Message (Signed)
Important Message  Patient Details  Name: Brandi Stephens MRN: [id number] Date of Birth: Nov 04, 1945   Medicare Important Message Given:  Yes     Orbie Pyo 08/02/2019, 4:25 PM

## 2019-08-02 NOTE — Progress Notes (Signed)
Physical Therapy Treatment Patient Details Name: Brandi Stephens MRN: [id number] DOB: 22-Dec-1945 Today's Date: 08/02/2019    History of Present Illness 74 yo returned to acute services 07/10/19 from CIR (3/12/-07/10/19 on CIR) due to CT scan revealed loosening of the L2 screws and a small paraspinal abscess. 3/24 aspiration by radiology.  07/24/19 to OR for L2-S1 extension of previous fusion due to instrumentation failure. Most recent lumbar fusion was 05/30/19 (L2/3) with d/c home the next day and re-admission to the hospital on 06/24/19 for AKI and back pain/spasm for which she d/c to CIR 3/12-3/24/21.  PMH-L RTC repair, multiple lumbar spine surgeries, R knee arthroscopy, cervical spine surgery, tremor, RLS, prediabetic, neuropathy, HTN, gout, COPD, CKD, diastolic CHF, anemia.    PT Comments    Pt progressing daily, not needing any rest break today when walking the loop around the unit.  She does continue to show signs of fatigue at the end of gait with progressively flexing knees. She continues to report some brief periods of dizziness during transitions up to sitting, and when looking down during gait.  Basic occulomotor exam showed possibly some left beating nystagmus with left gaze.  I am hopeful that the dizziness resolves with increased mobility as she reports it showed up after her last spinal surgery (I have had pts in the past who had their inner ear crystals thrown loose during spinal surgery), but treatment for BPPV is potentially very straining, so will defer to rehab to determine if she will need assessment and treatment before d/c home.  PT will continue to follow acutely for safe mobility progression.   Follow Up Recommendations  CIR     Equipment Recommendations  None recommended by PT    Recommendations for Other Services   NA     Precautions / Restrictions Precautions Precautions: Back;Fall Required Braces or Orthoses: Spinal Brace Spinal Brace: Lumbar corset;Applied in  sitting position    Mobility  Bed Mobility Overal bed mobility: Needs Assistance Bed Mobility: Rolling;Sidelying to Sit;Sit to Sidelying Rolling: Supervision Sidelying to sit: Supervision     Sit to sidelying: Mod assist General bed mobility comments: Suprvision to log roll to R side of EOB, heavy use of rail, mod assist to help lift both legs back into bed. again, heavy use of rail.   Transfers Overall transfer level: Needs assistance Equipment used: Rolling walker (2 wheeled) Transfers: Sit to/from Stand Sit to Stand: Min assist         General transfer comment: Min assist from lower bed today, attempted to push up with one hand on bed, but pt unable to do this, so reverted back to PT stabilizing RW and pt pushing straight down through bil RW handles.    Ambulation/Gait Ambulation/Gait assistance: Min assist Gait Distance (Feet): 130 Feet Assistive device: Rolling walker (2 wheeled) Gait Pattern/deviations: Step-through pattern;Shuffle Gait velocity: decreased Gait velocity interpretation: <1.8 ft/sec, indicate of risk for recurrent falls General Gait Details: cues to get inside of RW handles during gait and turning, pt continues to report a bit of vertigo, mainly when looking down, or during transitions from supine to sitting up EOB.  Pt had some fleeting left beating nystagmus during oculomotor assessment, but may need further vestibular assessment for BPPV at rehab.           Balance Overall balance assessment: Needs assistance Sitting-balance support: Feet supported;Bilateral upper extremity supported Sitting balance-Leahy Scale: Fair Sitting balance - Comments: pt has to unweight seated EOB with bil UEs, cannot stay  in this position long due to increased spine pain, needs assist with donning of shoes for gait.    Standing balance support: Bilateral upper extremity supported Standing balance-Leahy Scale: Poor Standing balance comment: min guard to min assist in  standing when attempting to manage underwear and pants after toileting.                             Cognition Arousal/Alertness: Awake/alert Behavior During Therapy: WFL for tasks assessed/performed Overall Cognitive Status: Impaired/Different from baseline Area of Impairment: Memory                     Memory: Decreased short-term memory         General Comments: Having a hard time remembering the names of things when ordering lunch today.  Needs re-orientation to day of the week and date (not all that unusual for her long hospitalization).              Pertinent Vitals/Pain Pain Assessment: Faces Faces Pain Scale: Hurts whole lot Pain Location: incisional at end of session Pain Descriptors / Indicators: Aching Pain Intervention(s): Limited activity within patient's tolerance;Monitored during session;Repositioned;Patient requesting pain meds-RN notified           PT Goals (current goals can now be found in the care plan section) Acute Rehab PT Goals Patient Stated Goal: to be safe to go home to see her husband of 50+ years Progress towards PT goals: Progressing toward goals    Frequency    Min 5X/week      PT Plan Current plan remains appropriate       AM-PAC PT "6 Clicks" Mobility   Outcome Measure  Help needed turning from your back to your side while in a flat bed without using bedrails?: A Little Help needed moving from lying on your back to sitting on the side of a flat bed without using bedrails?: A Little Help needed moving to and from a bed to a chair (including a wheelchair)?: A Little Help needed standing up from a chair using your arms (e.g., wheelchair or bedside chair)?: A Little Help needed to walk in hospital room?: A Little Help needed climbing 3-5 steps with a railing? : A Lot 6 Click Score: 17    End of Session Equipment Utilized During Treatment: Back brace Activity Tolerance: Patient limited by pain;Patient limited  by fatigue Patient left: in bed;with call bell/phone within reach   PT Visit Diagnosis: Unsteadiness on feet (R26.81);History of falling (Z91.81);Pain Pain - Right/Left: (incisional lower) Pain - part of body: (back)     Time: [id number] PT Time Calculation (min) (ACUTE ONLY): 39 min  Charges:  $Gait Training: 23-37 mins $Therapeutic Activity: 8-22 mins             Verdene Lennert, PT, DPT  Acute Rehabilitation [phone] pager #[phone] office              08/02/2019, 11:40 AM

## 2019-08-02 NOTE — Care Management (Signed)
Patient Details  Name: Brandi Stephens MRN: [id number] Date of Birth: 1945/10/25  Today's Date: 08/02/2019  Problem List:  Patient Active Problem List   Diagnosis Date Noted  . Post op infection 07/11/2019  . Muscle spasm   . Hypokalemia   . Status post lumbar spinal fusion 07/10/2019  . Postoperative pain   . Acute blood loss anemia   . Labile blood pressure   . Tobacco abuse   . Hypoalbuminemia due to protein-calorie malnutrition ([place])   . Anxiety 07/05/2019  . Depression 07/05/2019  . Chronic diastolic CHF (congestive heart failure) ([place]) 07/05/2019  . Obesity (BMI 30-39.9) 07/05/2019  . ETOH abuse 07/05/2019  . Lumbar radiculopathy 06/28/2019  . Spinal stenosis of lumbar region 06/28/2019  . Acute on chronic anemia   . Tremors of nervous system   . AKI (acute kidney injury) ([place])   . Chronic pain syndrome   . Acute renal failure superimposed on stage 3 chronic kidney disease ([place]) 06/25/2019  . Acute osteomyelitis of lumbar spine ([place]) 06/25/2019  . Weakness of left lower extremity   . Abdominal pain 06/24/2019  . Nausea 06/24/2019  . Abnormal urine odor 06/24/2019  . History of sepsis 06/24/2019  . Recent major surgery 06/24/2019  . History of diverticulosis 06/24/2019  . Elevated PTHrP level 05/09/2019  . Paresthesia 02/12/2019  . Low back pain 02/12/2019  . Neuropathy 12/20/2018  . Lumbar spinal stenosis 06/29/2018  . Preoperative clearance 06/04/2018  . Abnormal CT of the chest 06/04/2018  . Smoker 05/17/2018  . Hypothyroidism 11/30/2017  . Thoracic aortic atherosclerosis ([place]) 09/14/2016  . Benign essential tremor 09/09/2016  . RLS (restless legs syndrome) 09/09/2016  . H/O adenomatous polyp of colon 07/06/2016  . PVC's (premature ventricular contractions) 03/29/2016  . Preoperative cardiovascular examination 03/29/2016  . Grade I diastolic dysfunction 0000000  . Palpitations 01/11/2016  . Shortness of breath 01/11/2016  . Former cigarette smoker  01/11/2016  . Leg edema 01/11/2016  . Chronic cough 10/28/2015  . 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 Malignant neoplasm of upper-inner quadrant of left breast in female, estrogen receptor positive ([place]) 07/14/2015  . Prediabetes 07/08/2015  . CKD (chronic kidney disease), stage III 07/08/2015  . Dependent edema 06/08/2015  . Sleep disturbance 06/08/2015  . Mixed hyperlipidemia 06/08/2015  . Generalized anxiety disorder 06/08/2015  . Essential hypertension 06/08/2015  . COPD (chronic obstructive pulmonary disease) ([place]) 06/08/2015  . Gout 06/08/2015  . Macrocytic anemia   . 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 Sepsis ([place]) 05/15/2015  . Lumbar spondylosis 03/20/2015   Past Medical History:  Past Medical History:  Diagnosis Date  . AIN (anal intraepithelial neoplasia) anal canal   . Anemia    with pregnancy  . Anxiety   . Arthritis    knees, lower back, knees  . Chronic constipation   . Chronic diastolic CHF (congestive heart failure) ([place]) 07/05/2019  . CKD (chronic kidney disease)   . Coarse tremors    Essential  . COPD (chronic obstructive pulmonary disease) ([place])    2017 chest CT, history of  . Degenerative lumbar spinal stenosis   . Depression   . Depression 07/05/2019  . Drug dependency ([place])   . Elevated PTHrP level 05/09/2019  . ETOH abuse 07/05/2019  . GERD (gastroesophageal reflux disease)    pt denies  . Gout   . Grade I diastolic dysfunction 0000000   Noted ECHO  . Hemorrhoids   . History of adenomatous polyp of colon   .  History of basal cell carcinoma (BCC) excision 2015   nose  . History of sepsis 05/14/2014   post lumbar surgery  . History of shingles    x2  . Hyperlipidemia   . Hypertension   . Hypothyroidism   . Irregular heart beat    history of while undergoing radiation  . Malignant neoplasm of upper-inner quadrant of left breast in female, estrogen receptor positive [facility]) oncologist-  dr Jana Hakim--  per lov in epic no recurrence    dx 07-13-2015  Left breast invasive ductal carcinoma, Stage IA, Grade 1 (TXN0),  09-16-2015  s/p  left breast lumpectomy with sln bx's,  completed radiation 11-18-2015,  started antiestrogen therapy 12-14-2015  . Neuropathy    Left foot and back of left leg  . Obesity (BMI 30-39.9) 07/05/2019  . Personal history of radiation therapy    completed 11-18-2015  left breast  . Pneumonia   . PONV (postoperative nausea and vomiting)   . Prediabetes    diet controlled, no med  . Restless leg syndrome   . Seasonal allergies   . Seizure ([place]) 05/2015   due to sepsis, only one time episode  . Tremor   . Wears partial dentures    lower    Past Surgical History:  Past Surgical History:  Procedure Laterality Date  . ABDOMINAL HYSTERECTOMY  1985  . BASAL CELL CARCINOMA EXCISION  10/15   nose  . BREAST BIOPSY Right 08/24/2015  . BREAST BIOPSY Right 07/13/2015  . BREAST BIOPSY Left 07/13/2015  . BREAST EXCISIONAL BIOPSY Left 1972  . BREAST LUMPECTOMY Left   . BREAST LUMPECTOMY WITH RADIOACTIVE SEED AND SENTINEL LYMPH NODE BIOPSY Left 09/16/2015   Procedure: BREAST LUMPECTOMY WITH RADIOACTIVE SEED AND SENTINEL LYMPH NODE BIOPSY;  Surgeon: Stark Klein, MD;  Location: [place];  Service: General;  Laterality: Left;  . BREAST REDUCTION SURGERY Bilateral 1998  . CATARACT EXTRACTION W/ INTRAOCULAR LENS IMPLANT Left 2016  . [place]   hallo  . COLONOSCOPY  01/2013   polyps  . EYE SURGERY Left 2016  . HAND SURGERY Left 02-19-2002   dr Fredna Dow  @[facility]    repair collateral ligament/ MPJ left thumb  . HARDWARE REMOVAL N/A 07/24/2019   Procedure: REVISION OF HARDWARE Lumbar two - Sacral one. Extention of Fusion to Lumbar one;  Surgeon: [name], MD;  Location: [place] [place];  Service: Neurosurgery;  Laterality: N/A;  . HEMORRHOID SURGERY  2008  . HIGH RESOLUTION ANOSCOPY N/A 02/28/2018   Procedure: HIGH RESOLUTION ANOSCOPY WITH BIOPSY;  Surgeon: Leighton Ruff, MD;   Location: [facility];  Service: General;  Laterality: N/A;  . KNEE ARTHROSCOPY Right 2002  . LUMBAR SPINE SURGERY  03-20-2015   dr Kathyrn Sheriff   fusion L4-5, L5-S1  . MOUTH SURGERY  07/14/2018   2 infected teeth removed  . MULTIPLE TOOTH EXTRACTIONS     with bone grafting lower bottom right and left  . REDUCTION MAMMAPLASTY Bilateral   . RIGHT COLECTOMY  09-10-2003   dr Margot Chimes @[facility]    multiple colon polyps (per path tubular adenoma's, hyperplastic , benign appendix, benign two lymph nodes  . ROTATOR CUFF REPAIR Left 04/2016  . TONSILLECTOMY  1969  . TUBAL LIGATION Bilateral 1982   Social History:  reports that she quit smoking about 14 months ago. Her smoking use included cigarettes. She has a 17.50 pack-year smoking history. She has never used smokeless tobacco. She reports current alcohol use. She reports that  she does not use drugs.  Family / Support Systems Marital Status: Married Patient Roles: Spouse, Parent Spouse/Significant Other: Robert Children: Vyktoria Losurdo (daughter) Anticipated Caregiver: Conservation officer, historic buildings (dtr) and Herbie [place] (spouse) Ability/Limitations of Caregiver: sup mobility, min adls Caregiver Availability: 24/7  Social History Preferred language: English Religion: Episcopalian Read: Yes Write: Yes   Abuse/Neglect    Emotional Status Pt's affect, behavior and adjustment status: Normal behavior, mood and affect Recent Psychosocial Issues: Insomnia Psychiatric History: Anxiety disorder/depression, sleep disturbance disorder Substance Abuse History: Vodka shot daily and use of CBD (2020)  Patient / Family Perceptions, Expectations & Goals Pt/Family understanding of illness & functional limitations: Patient has a good understanding of current health and functional limitations Premorbid pt/family roles/activities: Required assistance for B+D, sedentary since recent back surgery Anticipated changes in roles/activities/participation: Will require assistance  with B+D at discharge and help to manage home Pt/family expectations/goals: Patient would like to get back to pre-admission functional status and be able to get up/down stairs to B+B "be normal and walk"  [employer]: None Premorbid Home Care/DME Agencies: None Transportation available at discharge: Daughter or spouse will provide transportation at discharge Resource referrals recommended: Neuropsychology  Discharge Planning Living Arrangements: Spouse/significant other, Children Support Systems: Spouse/significant other, Children Type of Residence: Private residence Insurance Resources: Multimedia programmer (specify)([employer]vant[employer]) Money Management: Patient, Spouse Does the patient have any problems obtaining your medications?: No Home Management: Family will manage the home, cooking, Archivist Preliminary Plans: Discharge home with spouse and daughter assisting as needed Sw Barriers to Discharge: Home environment access/layout Sw Barriers to Discharge Comments: 2 level home with 2 step entry and 14 steps up to B+B Social Work Anticipated Follow Up Needs: [address] Additional Notes/Comments: Home with spouse and daughter to assist Expected length of stay: 7-10 days  Clinical Impression Patient discharged to acute on 07/12/19 with ongoing back pain suspect lumbar osteo/discitis and hardware failure patient underwent removal of previous hardware including L2 pedicle screw and previous rod placement of posterior segmental instrumentation L1-S1 including standard pedicle screws at L1, L2, and placement of new rod and offset connector revision of previous interbody biomechanical cage 07/24/2019 per Dr. Kathyrn Sheriff. Lumbar corset maintained applied in sitting position.  Cefazolin x6 weeks and then consider transitioning to oral antibiotics.   See previous assessment from 07/01/19 for additional information.  Dorien Chihuahua  B 08/02/2019, 3:05 PM

## 2019-08-02 NOTE — IPOC Note (Addendum)
Individualized overall Plan of Care [facility]) Patient Details Name: Brandi Stephens MRN: [id number] DOB: [birth year]  Admitting Diagnosis: Lumbar radiculopathy  Hospital Problems: Principal Problem:   Lumbar radiculopathy Active Problems:   Status post lumbar spinal fusion   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HCC)   Chronic constipation     Functional Problem List: Nursing Endurance, Medication Management, Motor, Pain, Safety, Skin Integrity  PT Balance, Perception, Behavior, Safety, Edema, Sensory, Endurance, Skin Integrity, Motor, Nutrition, Pain  OT Balance, Safety, Cognition, Vision, Endurance, Motor, Pain  SLP    TR         Basic ADL's: OT Grooming, Bathing, Dressing, Toileting     Advanced  ADL's: OT Simple Meal Preparation     Transfers: PT Bed Mobility, Bed to Chair, Car, Manufacturing systems engineer, Metallurgist: PT Ambulation, Stairs     Additional Impairments: OT None  SLP        TR      Anticipated Outcomes Item Anticipated Outcome  Self Feeding No goal  Swallowing      Basic self-care  Supervision-Min A  Toileting  Min A   Bathroom Transfers Supervision  Bowel/Bladder  Patient to remain continent of bowel and bladder. Last BM 4/15  Transfers  mod-I  Locomotion  supervision  Communication     Cognition     Pain  Pain less than or equal to 2.  Safety/Judgment  Min assist for trasfer.   Therapy Plan: PT Intensity: Minimum of 1-2 x/day ,45 to 90 minutes PT Frequency: 5 out of 7 days PT Duration Estimated Length of Stay: ~7days OT Intensity: Minimum of 1-2 x/day, 45 to 90 minutes OT Frequency: 5 out of 7 days OT Duration/Estimated Length of Stay: 7-10 days      Team Interventions: Nursing Interventions Patient/Family Education, Skin Care/Wound Management, Disease Management/Prevention, Pain Management, Medication Management  PT interventions Ambulation/gait training, Community reintegration, DME/adaptive equipment instruction, Neuromuscular  re-education, Psychosocial support, Stair training, UE/LE Strength taining/ROM, Training and development officer, Discharge planning, Functional electrical stimulation, Pain management, Skin care/wound management, Therapeutic Activities, UE/LE Coordination activities, Disease management/prevention, Cognitive remediation/compensation, Functional mobility training, Patient/family education, Splinting/orthotics, Therapeutic Exercise, Visual/perceptual remediation/compensation  OT Interventions Balance/vestibular training, Self Care/advanced ADL retraining, Therapeutic Exercise, DME/adaptive equipment instruction, Pain management, UE/LE Strength taining/ROM, Patient/family education, Discharge planning, Functional mobility training, Therapeutic Activities, UE/LE Coordination activities, Cognitive remediation/compensation, Disease mangement/prevention, Visual/perceptual remediation/compensation, Community reintegration, Psychosocial support  SLP Interventions    TR Interventions    SW/CM Interventions Discharge Planning, Disease Management/Prevention, Psychosocial Support, Patient/Family Education   Barriers to Discharge MD  Medical stability, IV antibiotics and Weight  Nursing Other (comments)(prolonged antibiotic therapy.)    PT Inaccessible home environment, Home environment access/layout, IV antibiotics bed/bath on 2nd floor  OT IV antibiotics, Medical stability    SLP      SW Home environment access/layout, IV antibiotics 2 level home with 2 step entry with B+B on 2nd level with14 steps up to 2nd level   Team Discharge Planning: Destination: PT-Home ,OT- Home , SLP-  Projected Follow-up: PT-Home health PT, 24 hour supervision/assistance, OT-  Home health OT, SLP-  Projected Equipment Needs: PT-To be determined, OT- To be determined, SLP-  Equipment Details: PT- , OT-  Patient/family involved in discharge planning: PT- Patient, Family member/caregiver,  OT-Patient, SLP-   MD ELOS: 10-12  days. Medical Rehab Prognosis:  Good Assessment: 74 year old right-handed female with history of hypertension, 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 Jana Hakim, hypothyroidism, COPD  and quit smoking approximately 13 months ago, tremors as well as restless leg syndrome, chronic constipation, chronic kidney disease stage III, lumbar spinal stenosis post recent decompression and fusion 05/30/2019 per Dr. Kathyrn Sheriff discharge to home supervision level with rolling walker.  Reports by family since recent back surgery was limited ambulation secondary to mostly staying in bed requiring assistance for bathing and dressing.  Patient presented 06/25/2019 with increasing low back pain with radiation and weakness in the left leg.  Admission chemistries hemoglobin 9.7, WBC 12,700, BUN 70, creatinine 2.83, sodium 133, SARS coronavirus negative, ESR 136, lactic acid 0.9.  CT renal study negative for hydronephrosis or ureteral stone.  Blood cultures 1/4 blood cultures with GPC likely contaminant.  MRI lumbar spine showed postoperative changes from recent extension of posterior decompression and fusion L2-3 level, persistent severe stenosis at the level of L2-3 with question circumferential epidural collection.  Approximate 5 x 4 cm x 7.4 cm heterogeneous collection within the posterior paraspinous soft tissue extending from L3-L5-S1.  Follow-up neurosurgery Dr. Kathyrn Sheriff regards to latest back surgery no current plan for intervention at that time she was started on Decadron.  Renal function improved with gentle IV fluids.  Her hospital course was complicated by steroid-induced hyperglycemia.  Pain management initially maintained with MS Contin.  She was admitted to inpatient rehab services 06/28/2019 with slow progressive gains ongoing issues with regards to pain management.  She was ambulating distances of 200 feet assistive device needing multiple rest breaks ongoing complaints of back  pain with followed by neurosurgery.  CT of abdomen completed due to bouts of constipation showed no bowel obstruction however there was concerns of interval posterior slippage of the disc space at L2-3 as well as small paraspinal abscess L2-3.  Films were reviewed by neurosurgery recommendations for interventional radiology for aspiration of joint L2-3 and infectious disease consulted for antibiotic care.  Due to patient's ongoing limitations physically as well as work-up for abscess she was transferred to acute care services 07/12/2018 patient had been started on IV vancomycin as well as cefepime per infectious disease.  Cultures grew MS coag negative staph and recommendations were for 6 weeks of antibiotic care with cefazolin x6 weeks and then consider transitioning to oral antibiotics.  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 Kathyrn Sheriff.  Lumbar corset maintained applied in sitting position.  Maintained on Lovenox for DVT prophylaxis.  Pain control ongoing with MS Contin 15 mg every 12 hours as well as oxycodone for breakthrough pain.  Patient with resulting functional deficits with mobility, transfers, self-care, endurance.  We will set goals for supervision with PT and min a with OT.   Due to the current state of emergency, patients may not be receiving their 3-hours of Medicare-mandated therapy.  See Team Conference Notes for weekly updates to the plan of care

## 2019-08-02 NOTE — Care Management (Signed)
[facility] Individual Statement of Services  Patient Name:  Brandi Stephens  Date:  08/02/2019  Welcome to the [facility].  Our goal is to provide you with an individualized program based on your diagnosis and situation, designed to meet your specific needs.  With this comprehensive rehabilitation program, you will be expected to participate in at least 3 hours of rehabilitation therapies Monday-Friday, with modified therapy programming on the weekends.  Your rehabilitation program will include the following services:  Physical Therapy (PT), Occupational Therapy (OT), Speech Therapy (ST), 24 hour per day rehabilitation nursing, Therapeutic Recreation (TR), Neuropsychology, Case Management (Social Worker), Rehabilitation Medicine, Nutrition Services and Pharmacy Services  Weekly team conferences will be held on Wednesdays to discuss your progress.  Your Social Industrial/product designer will talk with you frequently to get your input and to update you on team discussions.  Team conferences with you and your family in attendance may also be held.  Expected length of stay: 7-10 days  Overall anticipated outcome: Modified Independent goals  Depending on your progress and recovery, your program may change. Your Social Industrial/product designer will coordinate services and will keep you informed of any changes. Your Social Worker's/Care Manager's name and contact numbers are listed  below.  The following services may also be recommended but are not provided by the [facility]:    [place] will be made to provide these services after discharge if needed.  Arrangements include referral to agencies that provide these services.  Your insurance has been verified to be:  [employer] Your primary doctor is:  Harland Dingwall, NP  Pertinent information will be shared  with your doctor and your insurance company.  Care Manager/Social Worker:  Dorien Chihuahua, RN  [phone] or [phone]  Information discussed with and copy given to patient by: Margarito Liner, 08/02/2019, 2:57 PM

## 2019-08-02 NOTE — Progress Notes (Signed)
  NEUROSURGERY PROGRESS NOTE   No issues overnight.  Feels well this am, with appropriate back soreness No concerns. Ready for CIR  EXAM:  BP (!) 157/74 (BP Location: Left Arm)   Pulse 90   Temp 98 F (36.7 C) (Oral)   Resp 15   Ht 5\' 8"  (1.727 m)   Wt 115.7 kg   SpO2 (!) 86%   BMI 38.77 kg/m   Awake, alert, oriented  Speech fluent, appropriate  CN grossly intact  MAEW Incision: c/d/i  IMPRESSION/PLAN 74 y.o. female s/p hardware revision for postoperative osteo/discitis. Continues to improve [employer] has approved CIR. Awaiting bed availability  - Continue abx  Will need staples removed next week

## 2019-08-02 NOTE — Discharge Summary (Signed)
Physician Discharge Summary  Patient ID: Brandi Stephens MRN: [id number] DOB/AGE: 73/31/1947 73 y.o.  Admit date: 07/11/2019 Discharge date: 08/02/2019  Admission Diagnoses:  Post op infection Lumbar discitis/ostemyelitis Abscess Need for PICC line ABL anemia  Discharge Diagnoses:  Same Active Problems:   Post op infection   Discharged Condition: Stable  Hospital Course:  Brandi Stephens is a 74 y.o. female s/p L2-3 decompression with extension of fusion from L2-S1 on 2/11,   Readmitted 3/9 for acute kidney injury.  As part of workup, an MRI as well as a CT scan of lumbar spine were ordered.  Dr. Conchita Paris reviewed the imaging.  While there was evidence of inflammatory change, he felt as though this was expected postoperatively.  He did not believe that the imaging represent did infection.  It was my recommended her acute kidney injury be addressed and we follow this closed on an outpatient basis. She was d/c to CIR on 3/12. CR complicated by anemia as well the AKI. TH consulted. IV hydration, meds held with improvement.  Just prior to discharge from CIR, a repeat CT scan of the abdomen and pelvis was ordered by her treatment team to ensure there was no worsening given persistent pain. This revealed interval posterior slippage of the disc space right L2-3 with loosening of the left L2 screw.  There were also findings concerning for infection including small paraspinal fluid collections at L2-3.  She was subsequently transferred to the main hospital for further workup and treatment.  She underwent a CT guided aspiration yesterday by Interventional Radiology, cx + for staph.  Infectious Disease was consulted and rec 6 weeks ancef.She underwent a revision of the fusion on 07/24/2019 which included removal of L2 pedicle screws bilaterally, and extensor tendon of prior fusion to include L1.  She had an uncomplicated hospital course.  Preoperative pain drastically improved, including complete  resolution and severe, debilitating muscle spasms.  She worked with Physical and Occupational therapy and was deemed a candidate for the inpatient rehab program.  On 08/02/2019 she was discharged to CIR in hemodynamic stable condition.  She will need to complete 6 weeks of IV antibiotics at the recommendation of Infectious Disease.  She will need her staples removed next week.  She will follow-up in 2 weeks after discharge from the inpatient rehab program.    Treatments: Surgery  1.  removal of previous hardware including L2 pedicle screws and previous rod 2.  placement of posterior segmental instrumentation L1-S1 including standard trajectory pedicle screws at L1, L2, and placement of new rod and offset connector 3.  revision of previous interbody biomechanical cage  Discharge Exam: Blood pressure (!) 157/74, pulse 90, temperature 98 F (36.7 C), temperature source Oral, resp. rate 15, height 5\' 8"  (1.727 m), weight 115.7 kg, SpO2 (!) 86 %. Awake, alert, oriented Speech fluent, appropriate CN grossly intact MAEW Wound c/d/i  Disposition: Discharge disposition: [facility] Not Defined       Discharge Instructions    Call MD for:  difficulty breathing, headache or visual disturbances   Complete by: As directed    Call MD for:  persistant dizziness or light-headedness   Complete by: As directed    Call MD for:  redness, tenderness, or signs of infection (pain, swelling, redness, odor or green/yellow discharge around incision site)   Complete by: As directed    Call MD for:  severe uncontrolled pain   Complete by: As directed    Call MD for:  temperature >100.4   Complete by: As directed    Home infusion instructions   Complete by: As directed    Instructions: Flushing of vascular access device: 0.9% NaCl pre/post medication administration and prn patency; Heparin 100 u/ml, 5ml for implanted ports and Heparin 10u/ml, 5ml for all other central venous  catheters.   Incentive spirometry RT   Complete by: As directed      Allergies as of 08/02/2019      Reactions   Ampicillin Hives, Other (See Comments)   Severe reaction in February 2017 1.5 month to have hives to go away   Penicillins Hives, Other (See Comments)   Severe reaction in February 2017 1.5 month to have hives to go away Has patient had a PCN reaction causing immediate rash, facial/tongue/throat swelling, SOB or lightheadedness with hypotension: #  #  #  YES  #  #  #  Has patient had a PCN reaction causing severe rash involving mucus membranes or skin necrosis: No Has patient had a PCN reaction that required hospitalization No Has patient had a PCN reaction occurring within the last 10 years: No   Gabapentin Swelling   UNSPECIFIED REACTION    Other Itching   UNSPECIFIED Analgesics   Codeine Nausea And Vomiting   Hydrocodone-acetaminophen Itching      Medication List    STOP taking these medications   Melatonin 300 MCG Tabs   morphine 15 MG 12 hr tablet Commonly known as: MS CONTIN     TAKE these medications   acetaminophen 325 MG tablet Commonly known as: TYLENOL Take 2 tablets (650 mg total) by mouth every 6 (six) hours as needed for moderate pain.   albuterol 108 (90 Base) MCG/ACT inhaler Commonly known as: VENTOLIN HFA INHALE 2 PUFFS BY MOUTH EVERY 4 TO 6 HOURS AS NEEDED   allopurinol 300 MG tablet Commonly known as: ZYLOPRIM TAKE 1 TABLET(300 MG) BY MOUTH DAILY   ALPRAZolam 0.25 MG tablet Commonly known as: XANAX Take 1 tablet (0.25 mg total) by mouth 3 (three) times daily as needed for anxiety.   anastrozole 1 MG tablet Commonly known as: ARIMIDEX TAKE 1 TABLET(1 MG) BY MOUTH DAILY What changed: See the new instructions.   atorvastatin 20 MG tablet Commonly known as: LIPITOR Take 1 tablet (20 mg total) by mouth daily.   Biotin 5000 5 MG Caps Generic drug: Biotin Take 5 mg by mouth daily.   CALCIUM 1000 + D PO Take 1 tablet by mouth  daily.   ceFAZolin  IVPB Commonly known as: ANCEF Inject 2 g into the vein every 8 (eight) hours. Indication:  Lumbar infection Last Day of Therapy:  08/21/2019 Labs - Once weekly:  CBC/D and BMP, Labs - Every other week:  ESR and CRP   colchicine 0.6 MG tablet Take 2 tablets now and 1 tablet an hour later for flare up What changed:   how much to take  how to take this  when to take this  reasons to take this  additional instructions   diclofenac Sodium 1 % Gel Commonly known as: VOLTAREN Apply 4 g topically 4 (four) times daily.   docusate sodium 100 MG capsule Commonly known as: COLACE Take 1 capsule (100 mg total) by mouth 3 (three) times daily.   DULoxetine 60 MG capsule Commonly known as: CYMBALTA TAKE 1 CAPSULE(60 MG) BY MOUTH DAILY   levothyroxine 75 MCG tablet Commonly known as: SYNTHROID Take 1 tablet (75 mcg total) by mouth daily before breakfast.  magnesium gluconate 500 MG tablet Commonly known as: MAGONATE Take 2 tablets (1,000 mg total) by mouth at bedtime.   metaxalone 800 MG tablet Commonly known as: SKELAXIN Take 1 tablet (800 mg total) by mouth 3 (three) times daily.   methocarbamol 500 MG tablet Commonly known as: ROBAXIN Take 2 tablets (1,000 mg total) by mouth 3 (three) times daily.   metoprolol succinate 25 MG 24 hr tablet Commonly known as: TOPROL-XL Take 0.5 tablets (12.5 mg total) by mouth daily.   MiraLax 17 GM/SCOOP powder Generic drug: polyethylene glycol powder Take 17 g by mouth daily as needed for moderate constipation.   oxyCODONE 5 MG immediate release tablet Commonly known as: Oxy IR/ROXICODONE Take 1 tablet (5 mg total) by mouth every 4 (four) hours as needed for severe pain.   potassium chloride SA 20 MEQ tablet Commonly known as: KLOR-CON Take 1 tablet (20 mEq total) by mouth daily.   primidone 50 MG tablet Commonly known as: MYSOLINE TAKE 1 TABLET(50 MG) BY MOUTH AT BEDTIME   QUEtiapine 25 MG tablet Commonly  known as: SEROQUEL Take 1 tablet (25 mg total) by mouth at bedtime.   rOPINIRole 0.25 MG tablet Commonly known as: REQUIP Take 3 tablets (0.75 mg total) by mouth at bedtime.   vitamin B-12 1000 MCG tablet Commonly known as: CYANOCOBALAMIN Take 1 tablet (1,000 mcg total) by mouth daily.            Home Infusion Instuctions  (From admission, onward)         Start     Ordered   08/02/19 0000  Home infusion instructions    Question:  Instructions  Answer:  Flushing of vascular access device: 0.9% NaCl pre/post medication administration and prn patency; Heparin 100 u/ml, 5ml for implanted ports and Heparin 10u/ml, 5ml for all other central venous catheters.   08/02/19 4098         Follow-up Information    [name], MD. Schedule an appointment as soon as possible for a visit in 2 week(s).   Specialty: Neurosurgery Why: after discharge from CIR Contact information: [address] [place] [address] [phone]           Signed: [name] 08/02/2019, 9:24 AM

## 2019-08-02 NOTE — TOC Transition Note (Signed)
Transition of Care [facility]) - CM/SW Discharge Note Marvetta Gibbons RN,BSN Transitions of Care Unit 4NP (non trauma) - RN Case Manager [phone]   Patient Details  Name: INDYA HANNINEN MRN: [id number] Date of Birth: [birth year]  Transition of Care [facility]) CM/SW Contact:  Dawayne Patricia, RN Phone Number: 08/02/2019, 11:48 AM   Clinical Narrative:    Pt stable for transition to [facility] rehab s/p hardware revision, will continue IV abx for total 6 wks then re-eval with ID for further abx needs. Have been notified by Urban Gibson with [facility] that pt has insurance approval for INPT rehab and [facility] has bed available today- will plan to admit later today to [facility]hab.  Pt/daughter agreeable to plan.    Final next level of care: IP Rehab Facility Barriers to Discharge: Barriers Resolved   Patient Goals and CMS Choice Patient states their goals for this hospitalization and ongoing recovery are:: get rid of pain, to be able to feel normal again and to walk well CMS Medicare.gov Compare Post Acute Care list provided to:: Patient Choice offered to / list presented to : Patient  Discharge Placement                 [facility]      Discharge Plan and Services   Discharge Planning Services: CM Consult Post Acute Care Choice: IP Rehab                    HH Arranged: RN, IV Antibiotics HH Agency: [facility]        Social Determinants of Health (SDOH) Interventions     Readmission Risk Interventions Readmission Risk Prevention Plan 08/02/2019  Transportation Screening Complete  PCP or Specialist appointment within 3-5 days of discharge (No Data)  [place] or [place] Complete  SW Recovery Care/Counseling Consult Complete  [place] Not Applicable  Some recent data might be hidden

## 2019-08-02 NOTE — Progress Notes (Signed)
PMR Admission Coordinator Pre-Admission Assessment   Patient: Brandi Stephens is an 74 y.o., female MRN: [id number] DOB: Sep 30, 1945 Height: '5\' 8"'  (172.7 cm) Weight: 115.7 kg   Insurance Information HMO:     PPO: yes     PCP:      IPA:      80/20:      OTHER:  PRIMARY: [employer]      Policy#: [id number]      Subscriber: pt CM Name: Leretha Dykes      Phone#: [phone]     Fax#: EPIC access  Pre-Cert#: [id number] auth for CIR admission provided by Stefanie for 7 days      Employer: n/a Benefits:  Phone #: [phone]     Name:  Eff. Date: 04/19/19     Deduct: $0      Out of Pocket Max: $3400 (met $295)      Life Max: n/a CIR: $295/day for days 1-6      SNF: $50/day for days 1-20 Outpatient:      Co-Pay: $15/visit Home Health: 100%      Co-Pay:  DME: 80%     Co-Pay: 20% Providers: preferred network  SECONDARY:       Policy#:      Phone#:    Development worker, community:       Phone#:    The Engineer, petroleum" for patients in Inpatient Rehabilitation Facilities with attached "Privacy Act [place] Records" was provided and verbally reviewed with: Patient and Family   Emergency Contact Information         Contact Information     Name Relation Home Work Mobile    Bazine Daughter [phone]        Lillyona, Polasek [phone]   [phone]    Daijha, Leggio Daughter     [phone]         Current Medical History  Patient Admitting Diagnosis: s/p removal of hardware and extension of lumbar fusion to L2-S1 History of Present Illness: Brandi Stephens is a 16 year old right-handed female with history of hypertension, 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 Jana Hakim, hypothyroidism, COPD and quit smoking approximately 13 months ago, tremors as well as restless leg syndrome, chronic constipation, chronic kidney disease stage III, lumbar spinal stenosis post recent decompression and fusion  05/30/2019 per Dr. Kathyrn Sheriff discharge to home supervision level with rolling walker.    Patient presented 06/25/2019 with increasing low back pain with radiation and weakness in the left leg.  Admission chemistries hemoglobin 9.7, WBC 12,700, BUN 70, creatinine 2.83, sodium 133, SARS coronavirus negative, ESR 136, lactic acid 0.9.  CT renal study negative for hydronephrosis or ureteral stone.  Blood cultures 1/4 blood cultures with GPC likely contaminant.  MRI lumbar spine showed postoperative changes from recent extension of posterior decompression and fusion L2-3 level.  Persistent severe stenosis at the level of L2-3 with question circumferential epidural collection.  Approximate 5 x 4 cm x 7.4 cm heterogeneous collection within the posterior paraspinous soft tissue extending from L3-L5-S1.  Follow-up neurosurgery Dr. Kathyrn Sheriff regards to latest back surgery no current plan for intervention at that time she was started on Decadron.  Renal function improved with gentle IV fluids.  Her hospital course was complicated by steroid-induced hyperglycemia.  Pain management initially maintained with MS Contin.  She was admitted to inpatient rehab services 06/28/2019 with slow progressive gains ongoing issues with regards to pain management.  She was ambulating distances of 200 feet  assistive device needing multiple rest breaks ongoing complaints of back pain with followed by neurosurgery.  CT of abdomen completed due to bouts of constipation showed no bowel obstruction however there was concerns of interval posterior slippage of the disc space at L2-3 as well as small paraspinal abscess L2-3.  Films were reviewed by neurosurgery recommendations for interventional radiology for aspiration of joint L2-3 and infectious disease consulted for antibiotic care.  Due to patient's ongoing limitations physically as well as work-up for abscess she was transferred to acute care services 07/12/2019.  Patient had been started on IV  vancomycin as well as cefepime per infectious disease.  Cultures grew MS coag negative staph and recommendations were for 6 weeks of antibiotic care with cefazolin x6 weeks and then consider transitioning to oral antibiotics.  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 Kathyrn Sheriff.  Lumbar corset maintained applied in sitting position.  Maintained on Lovenox for DVT prophylaxis.  Pain control ongoing with MS Contin 15 mg every 12 hours as well as oxycodone for breakthrough pain.  Therapy evaluations have been completed postoperatively and patient was recommended for a comprehensive rehab program.   Patient's medical record from [facility] has been reviewed by the rehabilitation admission coordinator and physician.   Past Medical History      Past Medical History:  Diagnosis Date  . AIN (anal intraepithelial neoplasia) anal canal    . Anemia      with pregnancy  . Anxiety    . Arthritis      knees, lower back, knees  . Chronic constipation    . Chronic diastolic CHF (congestive heart failure) ([place]) 07/05/2019  . CKD (chronic kidney disease)    . Coarse tremors      Essential  . COPD (chronic obstructive pulmonary disease) ([place])      2017 chest CT, history of  . Degenerative lumbar spinal stenosis    . Depression    . Depression 07/05/2019  . Drug dependency ([place])    . Elevated PTHrP level 05/09/2019  . ETOH abuse 07/05/2019  . GERD (gastroesophageal reflux disease)      pt denies  . Gout    . Grade I diastolic dysfunction 32/67/1245    Noted ECHO  . Hemorrhoids    . History of adenomatous polyp of colon    . History of basal cell carcinoma  (BCC) excision 2015    nose  . History of sepsis 05/14/2014    post lumbar surgery  . History of shingles      x2  . Hyperlipidemia    . Hypertension    . Hypothyroidism    . Irregular heart beat      history of while undergoing radiation  . Malignant neoplasm of upper-inner quadrant of left breast in female, estrogen receptor positive [facility]) oncologist-  dr Jana Hakim--  per lov in epic no recurrence    dx 07-13-2015  Left breast invasive ductal carcinoma, Stage IA, Grade 1 (TXN0),  09-16-2015  s/p  left breast lumpectomy with sln bx's,  completed radiation 11-18-2015,  started antiestrogen therapy 12-14-2015  . Neuropathy      Left foot and back of left leg  . Obesity (BMI 30-39.9) 07/05/2019  .  Personal history of radiation therapy      completed 11-18-2015  left breast  . Pneumonia    . PONV (postoperative nausea and vomiting)    . Prediabetes      diet controlled, no med  . Restless leg syndrome    . Seasonal allergies    . Seizure ([place]) 05/2015    due to sepsis, only one time episode  . Tremor    . Wears partial dentures      lower       Family History   family history includes Atrial fibrillation in her mother; Brain cancer in her maternal aunt; Breast cancer in her maternal aunt; Lung cancer in her maternal aunt and maternal uncle; Prostate cancer in her maternal grandfather; Stomach cancer in her maternal grandmother; Stroke in her paternal grandmother; Tuberculosis in her paternal grandfather; Ulcers in her father.   Prior Rehab/Hospitalizations Has the patient had prior rehab or hospitalizations prior to admission? Yes   Has the patient had major surgery during 100 days prior to admission? Yes              Current Medications   Current Facility-Administered Medications:  .  0.9 %  sodium chloride infusion (Manually program via Guardrails IV Fluids), , Intravenous, Once, Barrington Ellison, CRNA .  0.9 %  sodium chloride infusion, , Intravenous, Continuous, Costella,  Vincent [name], PA-C, Last Rate: 10 mL/hr at 07/30/19 0359, New Bag at 07/30/19 0359 .  0.9 %  sodium chloride infusion, 250 mL, Intravenous, Continuous, Costella, Vincent J, PA-C, Last Rate: 1 mL/hr at 07/24/19 2301, 250 mL at 07/24/19 2301 .  acetaminophen (TYLENOL) tablet 650 mg, 650 mg, Oral, Q4H PRN **OR** acetaminophen (TYLENOL) suppository 650 mg, 650 mg, Rectal, Q4H PRN, Costella, Vincent J, PA-C .  allopurinol (ZYLOPRIM) tablet 300 mg, 300 mg, Oral, Daily, Costella, Vincent J, PA-C, 300 mg at 08/02/19 0934 .  anastrozole (ARIMIDEX) tablet 1 mg, 1 mg, Oral, Daily, Costella, Vincent J, PA-C, 1 mg at 08/02/19 0934 .  atorvastatin (LIPITOR) tablet 20 mg, 20 mg, Oral, Daily, Costella, Vincent J, PA-C, 20 mg at 08/02/19 0934 .  bisacodyl (DULCOLAX) suppository 10 mg, 10 mg, Rectal, Daily PRN, Costella, Vincent J, PA-C, 10 mg at 07/31/19 1121 .  bupivacaine liposome (EXPAREL) 1.3 % injection 266 mg, 20 mL, Infiltration, Once, [name], MD .  ceFAZolin (ANCEF) IVPB 2g/100 mL premix, 2 g, Intravenous, Q8H, Nundkumar, Nena Polio, MD, Last Rate: 200 mL/hr at 08/02/19 0938, 2 g at 08/02/19 0938 .  Chlorhexidine Gluconate Cloth 2 % PADS 6 each, 6 each, Topical, Daily, [name], MD, 6 each at 08/01/19 1030 .  diazepam (VALIUM) tablet 5 mg, 5 mg, Oral, Q6H PRN, Viona Gilmore D, NP, 5 mg at 08/02/19 0537 .  diphenhydrAMINE (BENADRYL) capsule 25 mg, 25 mg, Oral, Q6H PRN, Costella, Vincent J, PA-C, 25 mg at 08/02/19 0533 .  docusate sodium (COLACE) capsule 100 mg, 100 mg, Oral, TID, Costella, Vincent J, PA-C, 100 mg at 08/02/19 0934 .  DULoxetine (CYMBALTA) DR capsule 60 mg, 60 mg, Oral, Daily, Costella, Vincent J, PA-C, 60 mg at 08/02/19 0934 .  enoxaparin (LOVENOX) injection 40 mg, 40 mg, Subcutaneous, Q24H, Vallarie Mare, MD, 40 mg at 08/02/19 0934 .  HYDROcodone-acetaminophen (NORCO/VICODIN) 5-325 MG per tablet 1 tablet, 1 tablet, Oral, Q4H PRN, Costella, Vista Mink, PA-C, 1 tablet at  08/01/19 1439 .  HYDROmorphone (DILAUDID) injection 0.5-1 mg, 0.5-1 mg, Intravenous, Q2H PRN, Costella, Vincent J, PA-C, 1 mg  at 07/29/19 1618 .  hydrOXYzine (ATARAX/VISTARIL) tablet 25 mg, 25 mg, Oral, Q6H PRN, Costella, Vincent J, PA-C, 25 mg at 08/02/19 0533 .  levothyroxine (SYNTHROID) tablet 75 mcg, 75 mcg, Oral, QAC breakfast, Costella, Vincent [name], PA-C, 75 mcg at 08/02/19 0534 .  magnesium gluconate (MAGONATE) tablet 1,000 mg, 1,000 mg, Oral, QHS, Costella, Vincent J, PA-C, 1,000 mg at 08/01/19 2203 .  menthol-cetylpyridinium (CEPACOL) lozenge 3 mg, 1 lozenge, Oral, PRN **OR** phenol (CHLORASEPTIC) mouth spray 1 spray, 1 spray, Mouth/Throat, PRN, Costella, Vincent J, PA-C .  metoprolol succinate (TOPROL-XL) 24 hr tablet 12.5 mg, 12.5 mg, Oral, Daily, Costella, Vincent J, PA-C, 12.5 mg at 08/02/19 0934 .  morphine (MS CONTIN) 12 hr tablet 15 mg, 15 mg, Oral, Q12H, Costella, Vincent J, PA-C, 15 mg at 08/02/19 0934 .  ondansetron (ZOFRAN) tablet 4 mg, 4 mg, Oral, Q6H PRN **OR** ondansetron (ZOFRAN) injection 4 mg, 4 mg, Intravenous, Q6H PRN, Costella, Vincent J, PA-C, 4 mg at 07/31/19 1338 .  oxyCODONE (Oxy IR/ROXICODONE) immediate release tablet 5 mg, 5 mg, Oral, Q4H PRN, Costella, Vincent J, PA-C, 5 mg at 07/29/19 0531 .  oxyCODONE (Oxy IR/ROXICODONE) immediate release tablet 5-10 mg, 5-10 mg, Oral, Q3H PRN, Costella, Vincent J, PA-C, 10 mg at 08/02/19 0535 .  polyethylene glycol (MIRALAX / GLYCOLAX) packet 17 g, 17 g, Oral, Daily PRN, Costella, Vincent J, PA-C, 17 g at 07/31/19 0935 .  primidone (MYSOLINE) tablet 50 mg, 50 mg, Oral, QHS, Costella, Vincent J, PA-C, 50 mg at 08/01/19 2203 .  QUEtiapine (SEROQUEL) tablet 25 mg, 25 mg, Oral, QHS, Costella, Vincent J, PA-C, 25 mg at 08/01/19 2202 .  rOPINIRole (REQUIP) tablet 0.75 mg, 0.75 mg, Oral, QHS, Costella, Vincent J, PA-C, 0.75 mg at 08/01/19 2204 .  senna (SENOKOT) tablet 8.6 mg, 1 tablet, Oral, BID, Costella, Vincent J, PA-C, 8.6 mg at  08/02/19 0934 .  senna-docusate (Senokot-S) tablet 1 tablet, 1 tablet, Oral, QHS PRN, Costella, Vista Mink, PA-C, 1 tablet at 07/16/19 1722 .  sodium chloride flush (NS) 0.9 % injection 10-40 mL, 10-40 mL, Intracatheter, Q12H, [name], MD, 10 mL at 08/02/19 0940 .  sodium chloride flush (NS) 0.9 % injection 10-40 mL, 10-40 mL, Intracatheter, PRN, [name], MD .  sodium chloride flush (NS) 0.9 % injection 3 mL, 3 mL, Intravenous, Q12H, Costella, Vincent J, PA-C, 3 mL at 08/02/19 0940 .  sodium chloride flush (NS) 0.9 % injection 3 mL, 3 mL, Intravenous, PRN, Costella, Vincent J, PA-C, 3 mL at 07/31/19 2128 .  sodium phosphate (FLEET) 7-19 GM/118ML enema 1 enema, 1 enema, Rectal, Once PRN, Costella, Vista Mink, PA-C .  vitamin B-12 (CYANOCOBALAMIN) tablet 1,000 mcg, 1,000 mcg, Oral, Daily, Costella, Vincent J, PA-C, 1,000 mcg at 08/02/19 8250   Patients Current Diet:     Diet Order                      Diet Heart Room service appropriate? Yes; Fluid consistency: Thin  Diet effective now                   Precautions / Restrictions Precautions Precautions: Back, Fall Precaution Booklet Issued: Yes (comment) Precaution Comments: pt able to repeat 3/3 back precautions and correctly report brace use Spinal Brace: Lumbar corset, Applied in sitting position Restrictions Weight Bearing Restrictions: No    Has the patient had 2 or more falls or a fall with injury in the past year? Yes   Prior Activity  Level Limited Community (1-2x/wk): had been more limited since recent surgery, though typically still drives and ambulates without an AD   Prior Functional Level Self Care: Did the patient need help bathing, dressing, using the toilet or eating? Independent   Indoor Mobility: Did the patient need assistance with walking from room to room (with or without device)? Independent   Stairs: Did the patient need assistance with internal or external stairs (with or without  device)? Independent   Functional Cognition: Did the patient need help planning regular tasks such as shopping or remembering to take medications? [place] / Equipment Home Assistive Devices/Equipment: Environmental consultant (specify type) Home Equipment: Walker - 2 wheels, Cane - single point, Grab bars - tub/shower, Environmental consultant - 4 wheels, Bedside commode, Shower seat, Tub bench(has ordered bed rail to attach to adjustable bed; bidet)   Prior Device Use: Indicate devices/aids used by the patient prior to current illness, exacerbation or injury? Walker   Current Functional Level Cognition   Overall Cognitive Status: Impaired/Different from baseline Current Attention Level: Sustained Orientation Level: Oriented X4 General Comments: Pt much  better oriented, I would consider being one day off normal for how long she has been here.  Reminded her the calendar is on the wall if she needs to re-orient herself throughout the day.    Extremity Assessment (includes Sensation/Coordination)   Upper Extremity Assessment: Overall WFL for tasks assessed  Lower Extremity Assessment: Defer to PT evaluation     ADLs   Overall ADL's : Needs assistance/impaired Eating/Feeding: Set up, Bed level Eating/Feeding Details (indicate cue type and reason): Pt continues to be in so much pain she was unable to sit up long enough to feed herself. Grooming: Set up, Supervision/safety, Sitting Grooming Details (indicate cue type and reason): seated on BSC at sink in bathroom Upper Body Bathing: Minimal assistance, Bed level Upper Body Bathing Details (indicate cue type and reason): Pt unable to tolerate sitting long enough to bathe Lower Body Bathing: Maximal assistance, Sit to/from stand Lower Body Bathing Details (indicate cue type and reason): Pt in too much pain to attempt much LE dressing. Upper Body Dressing : Minimal assistance, Sitting Upper Body Dressing Details (indicate cue type and reason): for  donning/doffing lumbar brace Lower Body Dressing: Maximal assistance, Sit to/from stand Lower Body Dressing Details (indicate cue type and reason): requires assist to don socks at bed level today Toilet Transfer: Stand-pivot, BSC, RW, Minimal assistance Toilet Transfer Details (indicate cue type and reason): min assist for steading as pt nauseaous. Toileting- Clothing Manipulation and Hygiene: Moderate assistance, Sitting/lateral lean Toileting - Clothing Manipulation Details (indicate cue type and reason): Pt has difficulty reaching to clean self due to pain. Functional mobility during ADLs: Minimal assistance, Rolling walker General ADL Comments: Pt very limited by back pain.  Only walked a few steps.  Pt also with N/V.     Mobility   Overal bed mobility: Needs Assistance Bed Mobility: Rolling, Sidelying to Sit, Sit to Sidelying Rolling: Supervision Sidelying to sit: Supervision Sit to supine: Min assist Sit to sidelying: Mod assist General bed mobility comments: Pt does better rolling to the right due to left hip pain when she rolls to the left. She reports she gets out on the right at home.  We practiced to the left as she was turned that way and it was closer to her initial destination of the bathroom.  Heavy reliance on rails for rolling and transitions to and from sitting to side lying.  Assist needed to lift both feet back into bed.      Transfers   Overall transfer level: Needs assistance Equipment used: Rolling walker (2 wheeled) Transfers: Sit to/from Stand Sit to Stand: Min assist Stand pivot transfers: Min assist General transfer comment: Min assist to power up to standing and stabilize RW.  Not yet able to push up from bed, so RW needs stabilizing so she can come up holding the walker, Does better from elevated BSC with grab bars in bathroom.  Total assist for pericare and donning of shoes prior to gait.      Ambulation / Gait / Stairs / Wheelchair Mobility    Ambulation/Gait Ambulation/Gait assistance: Herbalist (Feet): 65 Feet(x2 with 3 min seated rest) Assistive device: Rolling walker (2 wheeled) Gait Pattern/deviations: Step-through pattern, Shuffle General Gait Details: PT continues to have shuffling gait, when legs get weak, she sits and rests for a few mins and then is able to continue.  Only one seated rest today and bil knees not as bouncy towards the end of gait as they were last time I saw [name].  Gait velocity: decreased Gait velocity interpretation: <1.8 ft/sec, indicate of risk for recurrent falls     Posture / Balance Static Sitting Balance Static Sitting - Level of Assistance: 6: Modified independent (Device/Increase time) Dynamic Sitting Balance Dynamic Sitting - Level of Assistance: 5: Stand by assistance Sitting balance - Comments:   Static Standing Balance Static Standing - Level of Assistance: 4: Min assist Dynamic Standing Balance Dynamic Standing - Level of Assistance: 4: Min assist Balance Overall balance assessment: Needs assistance Sitting-balance support: Feet supported, Bilateral upper extremity supported Sitting balance-Leahy Scale: Fair Sitting balance - Comments:   Standing balance support: Bilateral upper extremity supported Standing balance-Leahy Scale: Poor Standing balance comment: reliant on BUE for ambulaiton, able to pull up underwear without any UE support or LOB as long as they are treaded high enough initially so she doesn't have to flex forward, still needs assist for pericare with spinal precautions     Special needs/care consideration Skin incision to lumbar area, Diabetic management yes and Designated visitor [name] and [place] (from acute therapy documentation) Living Arrangements: Spouse/significant other, Children  Lives With: Spouse Available Help at Discharge: Family, Available 24 hours/day Type of Home: House Home Layout: Two level,  Bed/bath upstairs Alternate Level Stairs-Rails: Can reach both Alternate Level Stairs-Number of Steps: 14 Home Access: Stairs to enter Entrance Stairs-Rails: None Entrance Stairs-Number of Steps: 3 Bathroom Shower/Tub: Gaffer, Chiropodist: Handicapped height Bathroom Accessibility: Yes Home Care Services: No Additional Comments: patient owns tub bench but was standing in the shower prior to this admission with assistance of daughter   Discharge Living Setting Plans for Discharge Living Setting: Patient's home Type of Home at Discharge: House Discharge Home Layout: Two level, Bed/bath upstairs Alternate Level Stairs-Rails: Can reach both Alternate Level Stairs-Number of Steps: 14 Discharge Home Access: Stairs to enter Entrance Stairs-Rails: None Entrance Stairs-Number of Steps: 2 Discharge Bathroom Shower/Tub: Tub/shower unit Discharge Bathroom Toilet: Standard Discharge Bathroom Accessibility: Yes How Accessible: Accessible via walker Does the patient have any problems obtaining your medications?: No   Social/Family/Support Systems Patient Roles: Spouse, Parent Anticipated Caregiver: Conservation officer, historic buildings (dtr) and Herbie [place] (spouse) Anticipated Caregiver's Contact Information: [name] [phone] Ability/Limitations of Caregiver: sup mobility, min adls Caregiver Availability: 24/7 Discharge Plan Discussed with Primary Caregiver: Yes Is Caregiver In Agreement with Plan?: Yes Does Caregiver/Family have  Issues with Lodging/Transportation while Pt is in Rehab?: No   Goals Patient/Family Goal for Rehab: PT/OT mod I Expected length of stay: 7-10 days Additional Information: significant improvement in pain since re-admit to acute Pt/Family Agrees to Admission and willing to participate: Yes Program Orientation Provided & Reviewed with Pt/Caregiver Including Roles  & Responsibilities: Yes   Decrease burden of Care through IP rehab admission: n/a    Possible need for SNF placement upon discharge: Not anticipated   Patient Condition: I have reviewed medical records from [facility], spoken with CM, and patient and daughter. I met with patient at the bedside for inpatient rehabilitation assessment.  Patient will benefit from ongoing PT and OT, can actively participate in 3 hours of therapy a day 5 days of the week, and can make measurable gains during the admission.  Patient will also benefit from the coordinated team approach during an Inpatient Acute Rehabilitation admission.  The patient will receive intensive therapy as well as Rehabilitation physician, nursing, social worker, and care management interventions.  Due to safety, skin/wound care, disease management, medication administration, pain management and patient education the patient requires 24 hour a day rehabilitation nursing.  The patient is currently min with mobility and basic ADLs.  Discharge setting and therapy post discharge at home is anticipated.  Patient has agreed to participate in the Acute Inpatient Rehabilitation Program and will admit today.   Preadmission Screen Completed By:  Michel Santee, PT, DPT 08/02/2019 10:14 AM ______________________________________________________________________   Discussed status with Dr. Posey Pronto on 08/02/19  at 10:22 AM  and received approval for admission today.   Admission Coordinator:  Michel Santee, PT, DPT time 10:22 AM Sudie Grumbling 08/02/19     Assessment/Plan: Diagnosis: Debility 1. Does the need for close, 24 hr/day Medical supervision in concert with the patient's rehab needs make it unreasonable for this patient to be served in a less intensive setting? Yes 2. 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 Due to bladder management, bowel management, safety, skin/wound care, disease management, pain management and patient education, does the patient require 24 hr/day rehab nursing? Yes 4. Does the patient require coordinated care of a physician, rehab nurse, PT, OT to address physical and functional deficits in the context of the above medical diagnosis(es)? Yes Addressing deficits in the following areas: balance, endurance, locomotion, strength, transferring, bowel/bladder control, bathing, dressing, toileting and psychosocial support 5. Can the patient actively participate in an intensive therapy program of at least 3 hrs of therapy 5 days a week? Yes 6. The potential for patient to make measurable gains while on inpatient rehab is excellent 7. Anticipated functional outcomes upon discharge from inpatient rehab: supervision PT, supervision OT, n/a SLP 8. Estimated rehab length of stay to reach the above functional goals is: 10-14 days. 9. Anticipated discharge destination: Home 10. Overall Rehab/Functional Prognosis: good     MD Signature: Delice Lesch, MD, ABPMR

## 2019-08-03 ENCOUNTER — Inpatient Hospital Stay (HOSPITAL_COMMUNITY): Payer: PPO | Admitting: Physical Therapy

## 2019-08-03 ENCOUNTER — Inpatient Hospital Stay (HOSPITAL_COMMUNITY): Payer: PPO | Admitting: Occupational Therapy

## 2019-08-03 DIAGNOSIS — Z981 Arthrodesis status: Secondary | ICD-10-CM

## 2019-08-03 NOTE — Evaluation (Signed)
Physical Therapy Assessment and Plan  Patient Details  Name: Brandi Stephens MRN: [id number] Date of Birth: 22-Apr-1945  PT Diagnosis: Abnormal posture, Abnormality of gait, Difficulty walking, Impaired sensation, Low back pain and Muscle weakness Rehab Potential: Good ELOS: ~7days   Today's Date: 08/03/2019 PT Individual Time: 1310-1404 and  [id number] PT Individual Time Calculation (min): 54 min and 61 min   and  Today's Date: 08/03/2019 PT Missed Time: 14 Minutes Missed Time Reason: Patient fatigue;Pain   Problem List:  Patient Active Problem List   Diagnosis Date Noted  . 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 Post op infection 07/11/2019  . Muscle spasm   . Hypokalemia   . Status post lumbar spinal fusion 07/10/2019  . Postoperative pain   . Acute blood loss anemia   . Labile blood pressure   . Tobacco abuse   . Hypoalbuminemia due to protein-calorie malnutrition ([place])   . Anxiety 07/05/2019  . Depression 07/05/2019  . Chronic diastolic CHF (congestive heart failure) ([place]) 07/05/2019  . Obesity (BMI 30-39.9) 07/05/2019  . ETOH abuse 07/05/2019  . Lumbar radiculopathy 06/28/2019  . Spinal stenosis of lumbar region 06/28/2019  . Acute on chronic anemia   . Tremors of nervous system   . AKI (acute kidney injury) ([place])   . Chronic pain syndrome   . Acute renal failure superimposed on stage 3 chronic kidney disease ([place]) 06/25/2019  . Acute osteomyelitis of lumbar spine ([place]) 06/25/2019  . Weakness of left lower extremity   . Abdominal pain 06/24/2019  . Nausea 06/24/2019  . Abnormal urine odor 06/24/2019  . History of sepsis 06/24/2019  . Recent major surgery 06/24/2019  . History of diverticulosis 06/24/2019  . Elevated PTHrP level 05/09/2019  . Paresthesia 02/12/2019  . Low back pain 02/12/2019  . Neuropathy 12/20/2018  . Lumbar spinal stenosis 06/29/2018  . Preoperative clearance 06/04/2018  . Abnormal CT of the chest 06/04/2018  . Smoker 05/17/2018  .  Hypothyroidism 11/30/2017  . Thoracic aortic atherosclerosis ([place]) 09/14/2016  . Benign essential tremor 09/09/2016  . RLS (restless legs syndrome) 09/09/2016  . H/O adenomatous polyp of colon 07/06/2016  . PVC's (premature ventricular contractions) 03/29/2016  . Preoperative cardiovascular examination 03/29/2016  . Grade I diastolic dysfunction 02/01/5101  . Palpitations 01/11/2016  . Shortness of breath 01/11/2016  . Former cigarette smoker 01/11/2016  . Leg edema 01/11/2016  . Chronic cough 10/28/2015  . 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 Malignant neoplasm of upper-inner quadrant of left breast in female, estrogen receptor positive ([place]) 07/14/2015  . Prediabetes 07/08/2015  . CKD (chronic kidney disease), stage III 07/08/2015  . Dependent edema 06/08/2015  . Sleep disturbance 06/08/2015  . Mixed hyperlipidemia 06/08/2015  . Generalized anxiety disorder 06/08/2015  . Essential hypertension 06/08/2015  . COPD (chronic obstructive pulmonary disease) ([place]) 06/08/2015  . Gout 06/08/2015  . Macrocytic anemia   . 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 Sepsis ([place]) 05/15/2015  . Lumbar spondylosis 03/20/2015    Past Medical History:  Past Medical History:  Diagnosis Date  . AIN (anal intraepithelial neoplasia) anal canal   . Anemia    with pregnancy  . Anxiety   . Arthritis    knees, lower back, knees  . Chronic constipation   . Chronic diastolic CHF (congestive heart failure) ([place]) 07/05/2019  . CKD (chronic kidney disease)   . Coarse tremors    Essential  . COPD (chronic obstructive pulmonary disease) ([place])    2017 chest CT, history of  .  Degenerative lumbar spinal stenosis   . Depression   . Depression 07/05/2019  . Drug dependency ([place])   . Elevated PTHrP level 05/09/2019  . ETOH abuse 07/05/2019  . GERD (gastroesophageal reflux disease)    pt denies  . Gout   . Grade I diastolic dysfunction 48/18/5631   Noted ECHO  . Hemorrhoids   . History of adenomatous  polyp of colon   . History of basal cell carcinoma (BCC) excision 2015   nose  . History of sepsis 05/14/2014   post lumbar surgery  . History of shingles    x2  . Hyperlipidemia   . Hypertension   . Hypothyroidism   . Irregular heart beat    history of while undergoing radiation  . Malignant neoplasm of upper-inner quadrant of left breast in female, estrogen receptor positive [facility]) oncologist-  dr Jana Hakim--  per lov in epic no recurrence   dx 07-13-2015  Left breast invasive ductal carcinoma, Stage IA, Grade 1 (TXN0),  09-16-2015  s/p  left breast lumpectomy with sln bx's,  completed radiation 11-18-2015,  started antiestrogen therapy 12-14-2015  . Neuropathy    Left foot and back of left leg  . Obesity (BMI 30-39.9) 07/05/2019  . Personal history of radiation therapy    completed 11-18-2015  left breast  . Pneumonia   . PONV (postoperative nausea and vomiting)   . Prediabetes    diet controlled, no med  . Restless leg syndrome   . Seasonal allergies   . Seizure ([place]) 05/2015   due to sepsis, only one time episode  . Tremor   . Wears partial dentures    lower    Past Surgical History:  Past Surgical History:  Procedure Laterality Date  . ABDOMINAL HYSTERECTOMY  1985  . BASAL CELL CARCINOMA EXCISION  10/15   nose  . BREAST BIOPSY Right 08/24/2015  . BREAST BIOPSY Right 07/13/2015  . BREAST BIOPSY Left 07/13/2015  . BREAST EXCISIONAL BIOPSY Left 1972  . BREAST LUMPECTOMY Left   . BREAST LUMPECTOMY WITH RADIOACTIVE SEED AND SENTINEL LYMPH NODE BIOPSY Left 09/16/2015   Procedure: BREAST LUMPECTOMY WITH RADIOACTIVE SEED AND SENTINEL LYMPH NODE BIOPSY;  Surgeon: Stark Klein, MD;  Location: [place];  Service: General;  Laterality: Left;  . BREAST REDUCTION SURGERY Bilateral 1998  . CATARACT EXTRACTION W/ INTRAOCULAR LENS IMPLANT Left 2016  . [place]   hallo  . COLONOSCOPY  01/2013   polyps  . EYE SURGERY Left 2016  . HAND SURGERY  Left 02-19-2002   dr Fredna Dow  '@[email or web address]'    repair collateral ligament/ MPJ left thumb  . HARDWARE REMOVAL N/A 07/24/2019   Procedure: REVISION OF HARDWARE Lumbar two - Sacral one. Extention of Fusion to Lumbar one;  Surgeon: [name], MD;  Location: [place];  Service: Neurosurgery;  Laterality: N/A;  . HEMORRHOID SURGERY  2008  . HIGH RESOLUTION ANOSCOPY N/A 02/28/2018   Procedure: HIGH RESOLUTION ANOSCOPY WITH BIOPSY;  Surgeon: Leighton Ruff, MD;  Location[facility];  Service: General;  Laterality: N/A;  . KNEE ARTHROSCOPY Right 2002  . LUMBAR SPINE SURGERY  03-20-2015   dr Kathyrn Sheriff   fusion L4-5, L5-S1  . MOUTH SURGERY  07/14/2018   2 infected teeth removed  . MULTIPLE TOOTH EXTRACTIONS     with bone grafting lower bottom right and left  . REDUCTION MAMMAPLASTY Bilateral   . RIGHT COLECTOMY  09-10-2003   dr Margot Chimes '@[email or web address]'    multiple colon  polyps (per path tubular adenoma's, hyperplastic , benign appendix, benign two lymph nodes  . ROTATOR CUFF REPAIR Left 04/2016  . TONSILLECTOMY  1969  . TUBAL LIGATION Bilateral 1982    Assessment & Plan Clinical Impression: Patient is a 74 y.o. year old right-handed female with history of hypertension, 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 Jana Hakim, hypothyroidism, COPD and quit smoking approximately 13 months ago, tremors as well as restless leg syndrome, chronic constipation, chronic kidney disease stage III, lumbar spinal stenosis post recent decompression and fusion 05/30/2019 per Dr. Kathyrn Sheriff discharge to home supervision level with rolling walker.  History taken from chart review and patient.  Patient lives with spouse.  Two-level home 2 steps to entry.  Reports by family since recent back surgery was limited ambulation secondary to mostly staying in bed requiring assistance for bathing and dressing.  Patient presented 06/25/2019 with increasing low back pain with radiation and weakness  in the left leg.  Admission chemistries hemoglobin 9.7, WBC 12,700, BUN 70, creatinine 2.83, sodium 133, SARS coronavirus negative, ESR 136, lactic acid 0.9.  CT renal study negative for hydronephrosis or ureteral stone.  Blood cultures 1/4 blood cultures with GPC likely contaminant.  MRI lumbar spine showed postoperative changes from recent extension of posterior decompression and fusion L2-3 level, persistent severe stenosis at the level of L2-3 with question circumferential epidural collection.  Approximate 5 x 4 cm x 7.4 cm heterogeneous collection within the posterior paraspinous soft tissue extending from L3-L5-S1.  Follow-up neurosurgery Dr. Kathyrn Sheriff regards to latest back surgery no current plan for intervention at that time she was started on Decadron.  Renal function improved with gentle IV fluids.  Her hospital course was complicated by steroid-induced hyperglycemia.  Pain management initially maintained with MS Contin.  She was admitted to inpatient rehab services 06/28/2019 with slow progressive gains ongoing issues with regards to pain management.  She was ambulating distances of 200 feet assistive device needing multiple rest breaks ongoing complaints of back pain with followed by neurosurgery.  CT of abdomen completed due to bouts of constipation showed no bowel obstruction however there was concerns of interval posterior slippage of the disc space at L2-3 as well as small paraspinal abscess L2-3.  Films were reviewed by neurosurgery recommendations for interventional radiology for aspiration of joint L2-3 and infectious disease consulted for antibiotic care.  Due to patient's ongoing limitations physically as well as work-up for abscess she was transferred to acute care services 07/12/2018 patient had been started on IV vancomycin as well as cefepime per infectious disease.  Cultures grew MS coag negative staph and recommendations were for 6 weeks of antibiotic care with cefazolin x6 weeks and then  consider transitioning to oral antibiotics.  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 Kathyrn Sheriff.  Lumbar corset maintained applied in sitting position.  Maintained on Lovenox for DVT prophylaxis.  Pain control ongoing with MS Contin 15 mg every 12 hours as well as oxycodone for breakthrough pain.  Therapy evaluations have been completed postoperatively and patient is admitted for a comprehensive rehab program. Patient transferred to CIR on 08/02/2019 .   Patient currently requires min with mobility secondary to muscle weakness, decreased cardiorespiratoy endurance  and decreased standing balance, decreased postural control, decreased balance strategies and difficulty maintaining precautions.  Prior to hospitalization, patient was modified independent  with mobility and lived with Spouse(Bob) in a House home.  Home access is 3Stairs to enter.  Patient will benefit from skilled PT intervention to maximize safe functional mobility, minimize fall risk and decrease caregiver burden for planned discharge home with 24 hour supervision.  Anticipate patient will benefit from follow up [place] at discharge.  PT - End of Session Activity Tolerance: Tolerates 30+ min activity with multiple rests Endurance Deficit: Yes Endurance Deficit Description: fatigue and pain requiring frequent rest breaks PT Assessment Rehab Potential (ACUTE/IP ONLY): Good PT Barriers to Discharge: Inaccessible home environment;Home environment access/layout;IV antibiotics PT Barriers to Discharge Comments: bed/bath on 2nd floor PT Patient demonstrates impairments in the following area(s):  Balance;Perception;Behavior;Safety;Edema;Sensory;Endurance;Skin Integrity;Motor;Nutrition;Pain PT Transfers Functional Problem(s): Bed Mobility;Bed to Chair;Car;Furniture PT Locomotion Functional Problem(s): Ambulation;Stairs PT Plan PT Intensity: Minimum of 1-2 x/day ,45 to 90 minutes PT Frequency: 5 out of 7 days PT Duration Estimated Length of Stay: ~7days PT Treatment/Interventions: Ambulation/gait training;Community reintegration;DME/adaptive equipment instruction;Neuromuscular re-education;Psychosocial support;Stair training;UE/LE Strength taining/ROM;Balance/vestibular training;Discharge planning;Functional electrical stimulation;Pain management;Skin care/wound management;Therapeutic Activities;UE/LE Coordination activities;Disease management/prevention;Cognitive remediation/compensation;Functional mobility training;Patient/family education;Splinting/orthotics;Therapeutic Exercise;Visual/perceptual remediation/compensation PT Transfers Anticipated Outcome(s): mod-I PT Locomotion Anticipated Outcome(s): supervision PT Recommendation Follow Up Recommendations: Home health PT;24 hour supervision/assistance Patient destination: Home Equipment Recommended: To be determined  Skilled Therapeutic Intervention Session 1: Evaluation completed (see details above and below) with education on PT POC and goals and individual treatment initiated with focus on bed mobility, functional transfers, gait training, activity tolerance, and education regarding daily therapy schedule, weekly team meetings, purpose of PT evaluation, and other CIR information. Pt received supine in bed and agreeable to therapy session reporting she is excited to be back in inpatient rehab. Pt reports that the muscle spasms she was experiencing last time in rehab are not present any longer. Reports overall she feels that she is doing better and getting stronger compared to prior rehab stay. Able to recall 3/3 spinal precautions with  increased time but no cuing and knows need to wear lumbar corset OOB. Pt confirms some dizziness has been occurring with supine>sit and sit>stand although reports she feels it often occurs when looking down during gait. Assessed vitals in supine: BP 126/54 (MAP 76), HR 100bpm. Supine>sitting R EOB via logroll technique with supervision and increased time/effort - min cuing on sequencing of technique. Pt demonstrates improved ability to tolerate sitting EOB with decreased pain/discomfort. Reassessed vitals in sitting as pt reports slight dizziness: BP 132/82 (MAP 98), HR 102bpm. Sitting EOB donned lumbar corset with set-up assist and donned shoes max assist - pt continues to demonstrate increased difficulty lifting L LE to place into shoe. Gait ~38f into bathroom using RW with CGA for steadying - demonstrates decreased B LE step length and slightly increased B UE support on RW though decreased compared to prior rehab stay. Sit<>stand BSC over toilet<>RW/grab bar with CGA for steadying - able to perform LB clothing management with mod assist, continent of bladder, and required total assist peri-care reporting this is still difficult/uncomfortable for her to perform. Gait ~172fback to sink using RW with CGA for steadying and able to perform standing hand hygiene with CGA.  Transported to/from gym in w/c for time management and energy conservation. Gait training 16220fsing RW with CGA for steadying - demonstrates decreased B LE step lengths, decreased gait speed, and increase in B UE support on RW -  reports with increased distance she has increased low back pain - denies any dizziness while walking. Provided seated rest break but pt unable to perform car transfer or stair navigation at this time due to "shaking" in her LEs (R>L) and fatigue. Transported back to room and pt requesting to return to bed to rest until next therapy session. Stand pivot w/c>EOB using RW with CGA. Doffed lumbar corset without assist.  Sit>supine via reverse logroll technique with supervision and increased time, min cuing - this is much improved from prior CIR stay as pt does not have muscle spasms and is able to raise LEs into the bed with less pain/discomfort. Left with needs in reach and bed alarm on.  Session 2: Pt received supine in bed with her daughter, [name], present and pt agreeable to therapy session. Reports pain level 3/10 in left lower back at beginning of session. Supine>sitting R EOB, HOB slightly elevated and using bedrails, via logroll technique with supervision. Sitting EOB donned lumbar corset with min assist to improve positioning as pt reports the corset pushes on her incision. Sit<>stands using RW with CGA for steadying throughout session - requires frequent cuing to push up with 1 hand from seat rather than pulling up with both hands on RW. Stand pivot to w/c using RW with CGA for steadying - continues to require max cuing throughout session to turn fully with the RW rather than leaving it off to the side when initiating sitting. Transported to/from gym in w/c for time management and energy conservation. Ambulatory car transfer using RW with CGA for steadying - pt demonstrates proper sequencing of turning to sit prior to placing LEs in the car - requires min assist to place L LE in/out. Ambulated ~49f up/down ramp using RW with min assist for balance. Transported to main gym in w/c. Ascended/descended 8steps using BHRs with therapist reinforcing education regarding sequencing of step-to pattern leading with R LE on ascent and L LE on descent - min assist for balance. After seated rest break able to complete 4 more steps using same technique and assist. Pt reports she is getting fatigued and her pain is starting to increase requesting to return to her room. Pt reports one of her goals is to sit in a chair for all 3 meals each day therefore tried recliner in room via stand pivot with RW and CGA; however, after ~25mutes  pt reports it was too uncomfortable and was not going to be able to tolerate that position for dinner. Ambulated ~1029fnto bathroom using RW with CGA for steadying. Sit<>stand BSC over toilet<>RW/grab bar with CGA - performed LB clothing management with mod assist - continent of bladder requiring total assist for peri-care. Gait ~2f2fck to EOB using RW with CGA. Doffed lumbar corset. Sit>supine with supervision via reverse logroll technique. Pt left supine in bed with needs in reach, bed alarm on, and RN present for medication administration. Missed 14 minutes of skilled physical therapy   PT Evaluation Precautions/Restrictions Precautions Precautions: Back;Fall Required Braces or Orthoses: Spinal Brace Spinal Brace: Lumbar corset;Applied in sitting position Restrictions Weight Bearing Restrictions: No Pain Pain Assessment Pain Scale: 0-10 Pain Score: 4  Pain Type: Acute pain;Surgical pain;Chronic pain Pain Location: Back Pain Orientation: Mid;Lower Pain Descriptors / Indicators: Aching Pain Frequency: Constant Pain Onset: On-going Patients Stated Pain Goal: 3 Pain Intervention(s): Medication (See eMAR);Relaxation;Rest;Emotional support;Repositioned Home Living/Prior Functioning Home Living Available Help at Discharge: Family;Available 24 hours/day(husband, Bob,Mikki Santeed daughters (ShaLarene Beach CathBarnetta [place] of Home: [facility]  Access: Stairs to enter [employer] of Steps: 3 Entrance Stairs-Rails: None Home Layout: Two level Alternate Level Stairs-Number of Steps: 14 Alternate Level Stairs-Rails: Can reach both;Right;Left  Lives With: Spouse(Bob) Prior Function Level of Independence: Requires assistive device for independence  Able to Take Stairs?: Yes Driving: Yes Comments: reports wanting to enjoy playing with her 64 y.o. granddaughter and walk - since surgery in Feb 2021 has been using RW for ambulation but requiring increased assist due to progressive L LE  weakness Perception  Perception Perception: Within Functional Limits Praxis Praxis: Intact  Cognition Overall Cognitive Status: Impaired/Different from baseline Arousal/Alertness: Awake/alert Orientation Level: Oriented X4 Attention: Focused;Sustained Focused Attention: Appears intact Sustained Attention: Impaired Immediate Memory Recall: Sock;Blue;Bed Memory Recall Sock: Not able to recall Memory Recall Blue: Without Cue Memory Recall Bed: Without Cue Behaviors: Restless;Impulsive(mildly impulsive) Safety/Judgment: Appears intact Sensation Sensation Light Touch: Impaired Detail Peripheral sensation comments: though intact on light touch screen pt reports deminished L LE sensation compared to R Light Touch Impaired Details: Impaired LLE Hot/Cold: Not tested Proprioception: Appears Intact Stereognosis: Not tested Coordination Gross Motor Movements are Fluid and Coordinated: No Coordination and Movement Description: Pt exhibits B UE tremors - impaired gross motor movements due to generalized weakness (L LE > R LE) and impaired balance with increased reliance on RW Motor  Motor Motor: Other (comment) Motor - Skilled Clinical Observations: Generalized weakness with tremors in B UEs and "shaking" LEs with increased activity  Mobility Bed Mobility Bed Mobility: Right Sidelying to Sit;Rolling Right;Sit to Sidelying Right;Rolling Left Rolling Right: Supervision/verbal cueing(using bed features) Rolling Left: Supervision/Verbal cueing(using bed features) Right Sidelying to Sit: Supervision/Verbal cueing(using bed features) Supine to Sit: Supervision/Verbal cueing(using bed features via logroll) Sit to Supine: Supervision/Verbal cueing(using bed features via reverse logroll) Sit to Sidelying Right: Supervision/Verbal cueing(using bed features) Transfers Transfers: Sit to Stand;Stand to Sit;Stand Pivot Transfers Sit to Stand: Contact Guard/Touching assist Stand to Sit: Contact  Guard/Touching assist Stand Pivot Transfers: Contact Guard/Touching assist Stand Pivot Transfer Details: Verbal cues for safe use of DME/AE;Verbal cues for precautions/safety;Verbal cues for technique;Verbal cues for sequencing;Visual cues for safe use of DME/AE Transfer (Assistive device): Rolling walker Locomotion  Gait Ambulation: Yes Gait Assistance: Contact Guard/Touching assist Gait Distance (Feet): 162 Feet Assistive device: Rolling walker Gait Assistance Details: Verbal cues for safe use of DME/AE;Verbal cues for precautions/safety;Verbal cues for technique;Verbal cues for gait pattern Gait Gait: Yes Gait Pattern: Impaired Gait Pattern: Decreased step length - left;Decreased step length - right;Step-through pattern;Decreased stride length Gait velocity: decreased Stairs / Additional Locomotion Stairs: Yes Stairs Assistance: Minimal Assistance - Patient > 75% Stair Management Technique: Two rails Number of Stairs: 8 Height of Stairs: 6 Ramp: Minimal Assistance - Patient >75% Wheelchair Mobility Wheelchair Mobility: No  Trunk/Postural Assessment  Cervical Assessment Cervical Assessment: Within Functional Limits(forward head) Thoracic Assessment Thoracic Assessment: Exceptions to WFL(rounded shoulders) Lumbar Assessment Lumbar Assessment: Exceptions to WFL(limited mobility due to surgeries) Postural Control Postural Control: Deficits on evaluation Postural Limitations: relies on RW for UE support during standing, but can release 1 hand at a time  Balance Balance Balance Assessed: Yes Static Sitting Balance Static Sitting - Balance Support: Feet supported Static Sitting - Level of Assistance: 6: Modified independent (Device/Increase time) Dynamic Sitting Balance Dynamic Sitting - Level of Assistance: 4: Min Insurance risk surveyor Standing - Balance Support: Bilateral upper extremity supported Static Standing - Level of Assistance: 4: Min assist;5: Stand  by assistance Dynamic Standing Balance Dynamic Standing - Balance Support: During functional  activity;Bilateral upper extremity supported Dynamic Standing - Level of Assistance: 4: Min assist Extremity Assessment      RLE Assessment RLE Assessment: Exceptions to [facility] Active Range of Motion (AROM) Comments: WFL RLE Strength Right Hip Flexion: 4-/5 Right Knee Flexion: 4+/5 Right Knee Extension: 4+/5 Right Ankle Dorsiflexion: 4+/5 Right Ankle Plantar Flexion: 4+/5 LLE Assessment LLE Assessment: Exceptions to [facility] Active Range of Motion (AROM) Comments: WFL LLE Strength Left Hip Flexion: 2+/5 Left Knee Flexion: 4-/5 Left Knee Extension: 4-/5 Left Ankle Dorsiflexion: 4/5 Left Ankle Plantar Flexion: 4-/5    Refer to Care Plan for Long Term Goals  Recommendations for other services: None   Discharge Criteria: Patient will be discharged from PT if patient refuses treatment 3 consecutive times without medical reason, if treatment goals not met, if there is a change in medical status, if patient makes no progress towards goals or if patient is discharged from hospital.  The above assessment, treatment plan, treatment alternatives and goals were discussed and mutually agreed upon: by patient and by family  Tawana Scale, PT, DPT 08/03/2019, 7:57 AM

## 2019-08-03 NOTE — Progress Notes (Signed)
[facility] PROGRESS NOTE   Subjective/Complaints:  Pt reports back doing better since surgery- wants a shower when possible.  Concerned about BP- said it has been very labile/all over the place.   Also notes R PICC hasn't been working quite right, according to nursing last night.   PICC for Ancef IV.    ROS:  Pt denies SOB, abd pain, CP, N/V/C/D, and vision changes  Objective:   No results found. Recent Labs    08/02/19 2220  WBC 10.3  HGB 9.5*  HCT 31.8*  PLT 300   Recent Labs    08/02/19 2220  CREATININE 0.75    Intake/Output Summary (Last 24 hours) at 08/03/2019 1552 Last data filed at 08/03/2019 1300 Gross per 24 hour  Intake 633 ml  Output -  Net 633 ml     Physical Exam: Vital Signs Blood pressure (!) 152/85, pulse 94, temperature 97.6 F (36.4 C), resp. rate 16, height 5\' 8"  (1.727 m), weight 104.1 kg, SpO2 96 %.  Vitals reviewed. Constitutional: - pt sitting up in bed; appropriate, talkative, nurse at bedside, NAD HENT: Conjugate gaze; EOMI B/L CV: RRR- no JVD Respiratory: CTA B/L- no W/R/R- good air movement  GI: Soft, NT, ND, (+)BS   Musculoskeletal:     Comments: Lower extremity edema still- no change  Neurological: Ox3 Motor: Bilateral upper extremities: 4/5 proximal distal Right lower extremity: 4/5 proximal distal Left lower extremity: 3-3+/5 proximal distal Sensation diminished to light touch left greater than sign right foot, greatest in fourth and fifth digits  Skin: Skin is warm and dry. PICC in RUE Back incision not examined  Psychiatric: slightly anxious   Assessment/Plan: 1. Functional deficits secondary to Back infection s/p back surgery  which require 3+ hours per day of interdisciplinary therapy in a comprehensive inpatient rehab setting.  Physiatrist is providing close team supervision and 24 hour management of active medical problems listed below.  Physiatrist and rehab team continue to  assess barriers to discharge/monitor patient progress toward functional and medical goals  Care Tool:  Bathing    Body parts bathed by patient: Right arm, Left arm, Chest, Abdomen, Left upper leg, Right upper leg, Face   Body parts bathed by helper: Front perineal area, Buttocks, Right lower leg, Left lower leg     Bathing assist Assist Level: Moderate Assistance - Patient 50 - 74%     Upper Body Dressing/Undressing Upper body dressing Upper body dressing/undressing activity did not occur (including orthotics): N/A What is the patient wearing?: Bra, Pull over shirt, Orthosis    Upper body assist Assist Level: Moderate Assistance - Patient 50 - 74%    Lower Body Dressing/Undressing Lower body dressing      What is the patient wearing?: Underwear/pull up, Pants     Lower body assist Assist for lower body dressing: Maximal Assistance - Patient 25 - 49%     Toileting Toileting    Toileting assist Assist for toileting: Maximal Assistance - Patient 25 - 49% Assistive Device Comment: RW   Transfers Chair/bed transfer  Transfers assist           Locomotion Ambulation   Ambulation assist              Walk 10 feet activity   Assist           Walk 50 feet activity   Assist           Walk 150 feet activity   Assist  Walk 10 feet on uneven surface  activity   Assist           Wheelchair     Assist               Wheelchair 50 feet with 2 turns activity    Assist            Wheelchair 150 feet activity     Assist          Blood pressure (!) 152/85, pulse 94, temperature 97.6 F (36.4 C), resp. rate 16, height 5\' 8"  (1.727 m), weight 104.1 kg, SpO2 96 %.  Medical Problem List and Plan: 1.  Decreased functional mobility secondary to acute on chronic back pain status post recent L2-3 fusion [id number] complicated by instrumentation failure with postoperative lumbar infection status post removal  of previous hardware placement of posterior segmental instrumentation L1-S1 with pedicle screws L1, L2 revision biomechanical cage 07/24/2019.  Back brace when out of bed             -patient may not shower?- was suggested no shower, however if PICC and back covered, should be OK             -ELOS/Goals: 7-10 days/supervision/min a             Admit to CIR 2.  Antithrombotics: -DVT/anticoagulation: Lovenox.  Most recent vascular study negative             -antiplatelet therapy: N/A 3. Pain Management: MS Contin 15 mg every 12 hours, Cymbalta 60 mg daily, hydrocodone as needed, Valium as needed for muscle spasms  4/17- pt reports pain controlled with current regimen- con't meds             Monitor with increased exertion 4. Mood: Provide emotional support             -antipsychotic agents: Seroquel 25 mg nightly 5. Neuropsych: This patient is capable of making decisions on her own behalf. 6. Skin/Wound Care: Routine skin checks 7. Fluids/Electrolytes/Nutrition: Routine in and outs.  CMP ordered. 8.  ID/MS coags negative staph.  Continue IV antibiotic Ancef x6 weeks initiated 07/11/2019  4/17- will need labs weekly 9.  Acute on chronic anemia.  CBC ordered.  4/17- labs Monday  10.  Hypertension.  Toprol 12.5 mg daily.  4/17- BP was 152/85 this AM, but down to 120/80s this afternoon- con't regimen             Monitor with increased activity. 11.  Chronic constipation.  Continue to adjust bowel program 12.  Hypothyroidism.  Synthroid 13.  History of breast cancer.  Follow-up outpatient Dr. Jana Hakim.Continue Arimidex 14.  History of restless leg syndrome.  Mysoline 50 mg nightly, Requip 0.75 mg nightly 15.  Hyperlipidemia.  Lipitor    LOS: 1 days A FACE TO FACE EVALUATION WAS PERFORMED  Nikia Levels 08/03/2019, 3:52 PM

## 2019-08-03 NOTE — Plan of Care (Signed)
  Problem: RH Balance Goal: LTG Patient will maintain dynamic standing with ADLs (OT) Description: LTG:  Patient will maintain dynamic standing balance with assist during activities of daily living (OT)  Flowsheets (Taken 08/03/2019 1643) LTG: Pt will maintain dynamic standing balance during ADLs with: Supervision/Verbal cueing   Problem: Sit to Stand Goal: LTG:  Patient will perform sit to stand in prep for activites of daily living with assistance level (OT) Description: LTG:  Patient will perform sit to stand in prep for activites of daily living with assistance level (OT) Flowsheets (Taken 08/03/2019 1643) LTG: PT will perform sit to stand in prep for activites of daily living with assistance level: Supervision/Verbal cueing   Problem: RH Bathing Goal: LTG Patient will bathe all body parts with assist levels (OT) Description: LTG: Patient will bathe all body parts with assist levels (OT) Flowsheets (Taken 08/03/2019 1643) LTG: Pt will perform bathing with assistance level/cueing: Minimal Assistance - Patient > 75%   Problem: RH Dressing Goal: LTG Patient will perform upper body dressing (OT) Description: LTG Patient will perform upper body dressing with assist, with/without cues (OT). Flowsheets (Taken 08/03/2019 1643) LTG: Pt will perform upper body dressing with assistance level of: Supervision/Verbal cueing Goal: LTG Patient will perform lower body dressing w/assist (OT) Description: LTG: Patient will perform lower body dressing with assist, with/without cues in positioning using equipment (OT) Flowsheets (Taken 08/03/2019 1643) LTG: Pt will perform lower body dressing with assistance level of: Minimal Assistance - Patient > 75%   Problem: RH Toileting Goal: LTG Patient will perform toileting task (3/3 steps) with assistance level (OT) Description: LTG: Patient will perform toileting task (3/3 steps) with assistance level (OT)  Flowsheets (Taken 08/03/2019 1643) LTG: Pt will perform  toileting task (3/3 steps) with assistance level: Minimal Assistance - Patient > 75%   Problem: RH Toilet Transfers Goal: LTG Patient will perform toilet transfers w/assist (OT) Description: LTG: Patient will perform toilet transfers with assist, with/without cues using equipment (OT) Flowsheets (Taken 08/03/2019 1643) LTG: Pt will perform toilet transfers with assistance level of: Supervision/Verbal cueing   Problem: RH Tub/Shower Transfers Goal: LTG Patient will perform tub/shower transfers w/assist (OT) Description: LTG: Patient will perform tub/shower transfers with assist, with/without cues using equipment (OT) Flowsheets (Taken 08/03/2019 1643) LTG: Pt will perform tub/shower stall transfers with assistance level of: Supervision/Verbal cueing

## 2019-08-03 NOTE — Evaluation (Signed)
Occupational Therapy Assessment and Plan  Patient Details  Name: Brandi Stephens MRN: [id number] Date of Birth: Sep 10, 1945  OT Diagnosis: acute pain, cognitive deficits, lumbago (low back pain), muscle weakness (generalized), pain in joint and coordination disorder Rehab Potential: Rehab Potential (ACUTE ONLY): Good ELOS: 7-10 days   Today's Date: 08/03/2019 OT Individual Time: 2620-3559 OT Individual Time Calculation (min): 60 min     Problem List:  Patient Active Problem List   Diagnosis Date Noted  . 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 Post op infection 07/11/2019  . Muscle spasm   . Hypokalemia   . Status post lumbar spinal fusion 07/10/2019  . Postoperative pain   . Acute blood loss anemia   . Labile blood pressure   . Tobacco abuse   . Hypoalbuminemia due to protein-calorie malnutrition ([place])   . Anxiety 07/05/2019  . Depression 07/05/2019  . Chronic diastolic CHF (congestive heart failure) ([place]) 07/05/2019  . Obesity (BMI 30-39.9) 07/05/2019  . ETOH abuse 07/05/2019  . Lumbar radiculopathy 06/28/2019  . Spinal stenosis of lumbar region 06/28/2019  . Acute on chronic anemia   . Tremors of nervous system   . AKI (acute kidney injury) ([place])   . Chronic pain syndrome   . Acute renal failure superimposed on stage 3 chronic kidney disease ([place]) 06/25/2019  . Acute osteomyelitis of lumbar spine ([place]) 06/25/2019  . Weakness of left lower extremity   . Abdominal pain 06/24/2019  . Nausea 06/24/2019  . Abnormal urine odor 06/24/2019  . History of sepsis 06/24/2019  . Recent major surgery 06/24/2019  . History of diverticulosis 06/24/2019  . Elevated PTHrP level 05/09/2019  . Paresthesia 02/12/2019  . Low back pain 02/12/2019  . Neuropathy 12/20/2018  . Lumbar spinal stenosis 06/29/2018  . Preoperative clearance 06/04/2018  . Abnormal CT of the chest 06/04/2018  . Smoker 05/17/2018  . Hypothyroidism 11/30/2017  . Thoracic aortic atherosclerosis ([place]) 09/14/2016   . Benign essential tremor 09/09/2016  . RLS (restless legs syndrome) 09/09/2016  . H/O adenomatous polyp of colon 07/06/2016  . PVC's (premature ventricular contractions) 03/29/2016  . Preoperative cardiovascular examination 03/29/2016  . Grade I diastolic dysfunction 74/16/3845  . Palpitations 01/11/2016  . Shortness of breath 01/11/2016  . Former cigarette smoker 01/11/2016  . Leg edema 01/11/2016  . Chronic cough 10/28/2015  . 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 Malignant neoplasm of upper-inner quadrant of left breast in female, estrogen receptor positive ([place]) 07/14/2015  . Prediabetes 07/08/2015  . CKD (chronic kidney disease), stage III 07/08/2015  . Dependent edema 06/08/2015  . Sleep disturbance 06/08/2015  . Mixed hyperlipidemia 06/08/2015  . Generalized anxiety disorder 06/08/2015  . Essential hypertension 06/08/2015  . COPD (chronic obstructive pulmonary disease) ([place]) 06/08/2015  . Gout 06/08/2015  . Macrocytic anemia   . 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 Sepsis ([place]) 05/15/2015  . Lumbar spondylosis 03/20/2015    Past Medical History:  Past Medical History:  Diagnosis Date  . AIN (anal intraepithelial neoplasia) anal canal   . Anemia    with pregnancy  . Anxiety   . Arthritis    knees, lower back, knees  . Chronic constipation   . Chronic diastolic CHF (congestive heart failure) ([place]) 07/05/2019  . CKD (chronic kidney disease)   . Coarse tremors    Essential  . COPD (chronic obstructive pulmonary disease) ([place])    2017 chest CT, history of  . Degenerative lumbar spinal stenosis   . Depression   . Depression 07/05/2019  .  Drug dependency ([place])   . Elevated PTHrP level 05/09/2019  . ETOH abuse 07/05/2019  . GERD (gastroesophageal reflux disease)    pt denies  . Gout   . Grade I diastolic dysfunction 02/01/5101   Noted ECHO  . Hemorrhoids   . History of adenomatous polyp of colon   . History of basal cell carcinoma (BCC) excision 2015    nose  . History of sepsis 05/14/2014   post lumbar surgery  . History of shingles    x2  . Hyperlipidemia   . Hypertension   . Hypothyroidism   . Irregular heart beat    history of while undergoing radiation  . Malignant neoplasm of upper-inner quadrant of left breast in female, estrogen receptor positive [facility]) oncologist-  dr Jana Hakim--  per lov in epic no recurrence   dx 07-13-2015  Left breast invasive ductal carcinoma, Stage IA, Grade 1 (TXN0),  09-16-2015  s/p  left breast lumpectomy with sln bx's,  completed radiation 11-18-2015,  started antiestrogen therapy 12-14-2015  . Neuropathy    Left foot and back of left leg  . Obesity (BMI 30-39.9) 07/05/2019  . Personal history of radiation therapy    completed 11-18-2015  left breast  . Pneumonia   . PONV (postoperative nausea and vomiting)   . Prediabetes    diet controlled, no med  . Restless leg syndrome   . Seasonal allergies   . Seizure ([place]) 05/2015   due to sepsis, only one time episode  . Tremor   . Wears partial dentures    lower    Past Surgical History:  Past Surgical History:  Procedure Laterality Date  . ABDOMINAL HYSTERECTOMY  1985  . BASAL CELL CARCINOMA EXCISION  10/15   nose  . BREAST BIOPSY Right 08/24/2015  . BREAST BIOPSY Right 07/13/2015  . BREAST BIOPSY Left 07/13/2015  . BREAST EXCISIONAL BIOPSY Left 1972  . BREAST LUMPECTOMY Left   . BREAST LUMPECTOMY WITH RADIOACTIVE SEED AND SENTINEL LYMPH NODE BIOPSY Left 09/16/2015   Procedure: BREAST LUMPECTOMY WITH RADIOACTIVE SEED AND SENTINEL LYMPH NODE BIOPSY;  Surgeon: Stark Klein, MD;  Location: [facility];  Service: General;  Laterality: Left;  . BREAST REDUCTION SURGERY Bilateral 1998  . CATARACT EXTRACTION W/ INTRAOCULAR LENS IMPLANT Left 2016  . [place]   hallo  . COLONOSCOPY  01/2013   polyps  . EYE SURGERY Left 2016  . HAND SURGERY Left 02-19-2002   dr Fredna Dow  '@[email or web address]'    repair collateral ligament/ MPJ left  thumb  . HARDWARE REMOVAL N/A 07/24/2019   Procedure: REVISION OF HARDWARE Lumbar two - Sacral one. Extention of Fusion to Lumbar one;  Surgeon: [name], MD;  Location: [facility];  Service: Neurosurgery;  Laterality: N/A;  . HEMORRHOID SURGERY  2008  . HIGH RESOLUTION ANOSCOPY N/A 02/28/2018   Procedure: HIGH RESOLUTION ANOSCOPY WITH BIOPSY;  Surgeon: Leighton Ruff, MD;  Location[facility];  Service: General;  Laterality: N/A;  . KNEE ARTHROSCOPY Right 2002  . LUMBAR SPINE SURGERY  03-20-2015   dr Kathyrn Sheriff   fusion L4-5, L5-S1  . MOUTH SURGERY  07/14/2018   2 infected teeth removed  . MULTIPLE TOOTH EXTRACTIONS     with bone grafting lower bottom right and left  . REDUCTION MAMMAPLASTY Bilateral   . RIGHT COLECTOMY  09-10-2003   dr Margot Chimes '@[email or web address]'    multiple colon polyps (per path tubular adenoma's, hyperplastic , benign appendix, benign two lymph nodes  .  ROTATOR CUFF REPAIR Left 04/2016  . TONSILLECTOMY  1969  . TUBAL LIGATION Bilateral 1982    Assessment & Plan Clinical Impression:  Brandi Stephens is a 17 year old right-handed female with history of hypertension, 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 Jana Hakim, hypothyroidism, COPD and quit smoking approximately 13 months ago, tremors as well as restless leg syndrome, chronic constipation, chronic kidney disease stage III, lumbar spinal stenosis post recent decompression and fusion 05/30/2019 per Dr. Kathyrn Sheriff discharge to home supervision level with rolling walker.  History taken from chart review and patient.  Patient lives with spouse.  Two-level home 2 steps to entry.  Reports by family since recent back surgery was limited ambulation secondary to mostly staying in bed requiring assistance for bathing and dressing.  Patient presented 06/25/2019 with increasing low back pain with radiation and weakness in the left leg.  Admission chemistries hemoglobin 9.7, WBC 12,700,  BUN 70, creatinine 2.83, sodium 133, SARS coronavirus negative, ESR 136, lactic acid 0.9.  CT renal study negative for hydronephrosis or ureteral stone.  Blood cultures 1/4 blood cultures with GPC likely contaminant.  MRI lumbar spine showed postoperative changes from recent extension of posterior decompression and fusion L2-3 level, persistent severe stenosis at the level of L2-3 with question circumferential epidural collection.  Approximate 5 x 4 cm x 7.4 cm heterogeneous collection within the posterior paraspinous soft tissue extending from L3-L5-S1.  Follow-up neurosurgery Dr. Kathyrn Sheriff regards to latest back surgery no current plan for intervention at that time she was started on Decadron.  Renal function improved with gentle IV fluids.  Her hospital course was complicated by steroid-induced hyperglycemia.  Pain management initially maintained with MS Contin.  She was admitted to inpatient rehab services 06/28/2019 with slow progressive gains ongoing issues with regards to pain management.  She was ambulating distances of 200 feet assistive device needing multiple rest breaks ongoing complaints of back pain with followed by neurosurgery.  CT of abdomen completed due to bouts of constipation showed no bowel obstruction however there was concerns of interval posterior slippage of the disc space at L2-3 as well as small paraspinal abscess L2-3.  Films were reviewed by neurosurgery recommendations for interventional radiology for aspiration of joint L2-3 and infectious disease consulted for antibiotic care.  Due to patient's ongoing limitations physically as well as work-up for abscess she was transferred to acute care services 07/12/2018 patient had been started on IV vancomycin as well as cefepime per infectious disease.  Cultures grew MS coag negative staph and recommendations were for 6 weeks of antibiotic care with cefazolin x6 weeks and then consider transitioning to oral antibiotics.  Patient with ongoing  back pain suspect lumbar osteo/discitis and hardware failure patient underwent removal of previous hardware including L2 pedicle screw and previous rod placement of posterior segmental instrumentation L1-S1 including standard pedicle screws at L1, L2, and placement of new rod and offset connector revision of previous interbody biomechanical cage 07/24/2019 per Dr. Kathyrn Sheriff.  Lumbar corset maintained applied in sitting position.  Maintained on Lovenox for DVT prophylaxis.  Pain control ongoing with MS Contin 15 mg every 12 hours as well as oxycodone for breakthrough pain.  Therapy evaluations have been completed postoperatively and patient is admitted for a comprehensive rehab program.  Please see preadmission assessment from earlier today as well.   Patient currently requires Min-Max with basic self-care skills secondary to muscle weakness and pain, decreased cardiorespiratoy endurance, decreased attention, decreased problem solving and decreased safety  awareness and decreased standing balance and difficulty maintaining precautions.  Prior to hospitalization, patient could complete BADLs with supervision using device when pain was controlled.   Patient will benefit from skilled intervention to increase independence with basic self-care skills prior to discharge home with family support.  Anticipate patient will require 24 hour supervision and minimal physical assistance and follow up home health.  OT - End of Session Endurance Deficit: Yes Endurance Deficit Description: fatigue and pain/spasm limiting participation in tasks OT Assessment Rehab Potential (ACUTE ONLY): Good OT Barriers to Discharge: IV antibiotics;Medical stability OT Patient demonstrates impairments in the following area(s): Balance;Safety;Cognition;Vision;Endurance;Motor;Pain OT Basic ADL's Functional Problem(s): Grooming;Bathing;Dressing;Toileting OT Advanced ADL's Functional Problem(s): Simple Meal Preparation OT Transfers Functional  Problem(s): Toilet;Tub/Shower OT Additional Impairment(s): None OT Plan OT Intensity: Minimum of 1-2 x/day, 45 to 90 minutes OT Frequency: 5 out of 7 days OT Duration/Estimated Length of Stay: 7-10 days OT Treatment/Interventions: Balance/vestibular training;Self Care/advanced ADL retraining;Therapeutic Exercise;DME/adaptive equipment instruction;Pain management;UE/LE Strength taining/ROM;Patient/family education;Discharge planning;Functional mobility training;Therapeutic Activities;UE/LE Coordination activities;Cognitive remediation/compensation;Disease mangement/prevention;Visual/perceptual remediation/compensation;Community reintegration;Psychosocial support OT Self Feeding Anticipated Outcome(s): No goal OT Basic Self-Care Anticipated Outcome(s): Supervision-Min A OT Toileting Anticipated Outcome(s): Min A OT Bathroom Transfers Anticipated Outcome(s): Supervision OT Recommendation Recommendations for Other Services: Neuropsych consult Patient destination: Home Follow Up Recommendations: Home health OT Equipment Recommended: To be determined   Skilled Therapeutic Intervention Skilled OT session completed with focus on initial evaluation, education on OT role/POC, and establishment of patient-centered goals.   Pt greeted in bed, RN present at start of session to provide pain medicine. Pt agreeable to completing BADLs this AM. Min-Mod A for transition to EOB using logroll technique. Once EOB pt engaged in bathing/dressing tasks at sit<stand level using RW for standing support. Pt exhibits B UE tremors Lt>Rt and also B LE spasms. CGA for dynamic sitting and dynamic standing activity. Pt limited with functional movement due to pain in incisional site and also due to her back precautions. Max A for LB self care and perihygiene with pt having limited internal rotation in both shoulders for reaching buttocks without twisting. Family has installed a bidet at home to assist with toileting needs. Pt was  able to elevate clothing over hips herself. She also completed toileting, using RW at ambulatory level with CGA for transfer while wearing her TLSO. Pt had continent bladder void and then ambulated to the sink to wash her hands. She then reported she felt like she was going to "pass out" if she didn't sit down immediately. Therefore she transferred back to EOB. Pt required setup for hand hygiene using sanitizer, she reported dizziness absolved in sitting, requesting to return to bed due to fatigue. Mod A for logroll back to bed. Left her with all needs within reach and bed alarm set.   OT Evaluation Precautions/Restrictions  Precautions Precautions: Back;Fall Required Braces or Orthoses: Spinal Brace Spinal Brace: Lumbar corset;Applied in sitting position Restrictions Weight Bearing Restrictions: No Pain Pain Assessment Pain Scale: 0-10 Pain Score: 7  Pain Type: Acute pain;Surgical pain Pain Location: Back Pain Descriptors / Indicators: Aching;Discomfort Pain Frequency: Constant Pain Onset: On-going Patients Stated Pain Goal: 4 Pain Intervention(s): Medication (See eMAR) Home Living/Prior Functioning Home Living Living Arrangements: Spouse/significant other, Children Available Help at Discharge: Family, Available 24 hours/day Type of Home: House Home Layout: Two level Bathroom Shower/Tub: Gaffer, Chiropodist: Handicapped height Bathroom Accessibility: Yes  Lives With: Spouse Prior Function Level of Independence: Requires assistive device for independence ADL ADL Grooming: Setup Where  Assessed-Grooming: Edge of bed Upper Body Bathing: Supervision/safety Where Assessed-Upper Body Bathing: Edge of bed Lower Body Bathing: Maximal assistance Where Assessed-Lower Body Bathing: Edge of bed Upper Body Dressing: Moderate assistance Where Assessed-Upper Body Dressing: Edge of bed Lower Body Dressing: Maximal assistance Where Assessed-Lower Body Dressing:  Edge of bed Toileting: Maximal assistance Where Assessed-Toileting: Glass blower/designer: Therapist, music Method: Ambulating(RW) Tub/Shower Transfer: Not assessed Vision Baseline Vision/History: Wears glasses;Cataracts(hx retinal bleeding and cataracts) Wears Glasses: Reading only Patient Visual Report: Diplopia;Blurring of vision(pt reports her MD is scheduling eye surgery for the future, Lt eye more affected that Rt) Perception  Perception: Within Functional Limits Praxis Praxis: Intact Cognition Arousal/Alertness: Awake/alert Orientation Level: Person;Place;Situation Person: Oriented Place: Oriented Situation: Oriented Year: 2021 Month: April Day of Week: Incorrect Immediate Memory Recall: Sock;Blue;Bed Memory Recall Sock: Not able to recall Memory Recall Blue: Without Cue Memory Recall Bed: Without Cue Attention: Sustained Sustained Attention: Impaired Behaviors: Impulsive(mildly) Safety/Judgment: Appears intact Sensation Coordination Gross Motor Movements are Fluid and Coordinated: No Fine Motor Movements are Fluid and Coordinated: No Coordination and Movement Description: Pt exhibits B UE tremors and B LE spasms Finger Nose Finger Test: Tremulous, Lt>Rt Motor  Motor Motor: Other (comment) Motor - Skilled Clinical Observations: Generalized weakness with tremor and spasm activity during functional movement Mobility    CGA ambulatory toilet transfer using RW Trunk/Postural Assessment  Postural Control Postural Control: Within Functional Limits  Balance Balance Balance Assessed: Yes Dynamic Sitting Balance Dynamic Sitting - Balance Support: No upper extremity supported;During functional activity(Attempting to thread LEs into pants) Dynamic Sitting - Level of Assistance: 4: Min assist Dynamic Sitting - Balance Activities: Lateral lean/weight shifting;Forward lean/weight shifting Dynamic Standing Balance Dynamic Standing - Balance Support: During  functional activity;No upper extremity supported Dynamic Standing - Level of Assistance: 4: Min assist(Elevating clothing over hips) Dynamic Standing - Balance Activities: Lateral lean/weight shifting;Forward lean/weight shifting;Reaching for objects Extremity/Trunk Assessment RUE Assessment RUE Assessment: Within Functional Limits Active Range of Motion (AROM) Comments: WNL excluding limitations with shoulder internal rotation LUE Assessment LUE Assessment: Within Functional Limits Active Range of Motion (AROM) Comments: WNL excluding limitations with shoulder internal rotation     Refer to Care Plan for Long Term Goals  Recommendations for other services: Neuropsych, vestibular evaluation   Discharge Criteria: Patient will be discharged from OT if patient refuses treatment 3 consecutive times without medical reason, if treatment goals not met, if there is a change in medical status, if patient makes no progress towards goals or if patient is discharged from hospital.  The above assessment, treatment plan, treatment alternatives and goals were discussed and mutually agreed upon: by patient  Skeet Simmer 08/03/2019, 11:28 AM

## 2019-08-04 DIAGNOSIS — Z981 Arthrodesis status: Secondary | ICD-10-CM | POA: Diagnosis not present

## 2019-08-04 MED ORDER — METOPROLOL SUCCINATE ER 25 MG PO TB24
25.0000 mg | ORAL_TABLET | Freq: Every day | ORAL | Status: DC
  Administered 2019-08-05 – 2019-08-10 (×6): 25 mg via ORAL
  Filled 2019-08-04 (×6): qty 1

## 2019-08-04 MED ORDER — CHLORHEXIDINE GLUCONATE CLOTH 2 % EX PADS
6.0000 | MEDICATED_PAD | Freq: Two times a day (BID) | CUTANEOUS | Status: DC
  Administered 2019-08-05 – 2019-08-10 (×10): 6 via TOPICAL

## 2019-08-04 NOTE — Progress Notes (Signed)
[place] PHYSICAL MEDICINE & REHABILITATION PROGRESS NOTE   Subjective/Complaints:  Pt reports was nauseated this AM- admits hasn't had a BM yet- took laxative around 5am this AM- hasn't gone yet.  Nausea is a little better now.   No other complaints- BP better, per pt- but she was worried about it yesterday.   ROS:  Pt denies SOB, abd pain, CP, N/V/C/D, and vision changes   Objective:   No results found. Recent Labs    08/02/19 2220  WBC 10.3  HGB 9.5*  HCT 31.8*  PLT 300   Recent Labs    08/02/19 2220  CREATININE 0.75    Intake/Output Summary (Last 24 hours) at 08/04/2019 1231 Last data filed at 08/03/2019 1729 Gross per 24 hour  Intake 280 ml  Output --  Net 280 ml     Physical Exam: Vital Signs Blood pressure (!) 147/75, pulse 95, temperature 98.2 F (36.8 C), resp. rate 18, height 5\' 8"  (1.727 m), weight 104.1 kg, SpO2 93 %.  Vitals reviewed. Constitutional: - pt supine in bed; appropriate, NAD HENT: Conjugate gaze; EOMI B/L CV: RRR Respiratory: CTA B/L- no W/R/R- good air movement GI: soft, but diffusely mild TTP- hypoactive BS; asked me to not push on abdomen  Musculoskeletal:     Comments: Lower extremity edema still- no change  Neurological: Ox3 Motor: Bilateral upper extremities: 4/5 proximal distal Right lower extremity: 4/5 proximal distal Left lower extremity: 3-3+/5 proximal distal Sensation diminished to light touch left greater than sign right foot, greatest in fourth and fifth digits  Skin: Skin is warm and dry. PICC in RUE Back incision look great- staples intact- no drainage or erythema seen Psychiatric: slightly anxious   Assessment/Plan: 1. Functional deficits secondary to Back infection s/p back surgery  which require 3+ hours per day of interdisciplinary therapy in a comprehensive inpatient rehab setting.  Physiatrist is providing close team supervision and 24 hour management of active medical problems listed  below.  Physiatrist and rehab team continue to assess barriers to discharge/monitor patient progress toward functional and medical goals  Care Tool:  Bathing    Body parts bathed by patient: Right arm, Left arm, Chest, Abdomen, Left upper leg, Right upper leg, Face   Body parts bathed by helper: Front perineal area, Buttocks, Right lower leg, Left lower leg     Bathing assist Assist Level: Moderate Assistance - Patient 50 - 74%     Upper Body Dressing/Undressing Upper body dressing Upper body dressing/undressing activity did not occur (including orthotics): N/A What is the patient wearing?: Bra, Pull over shirt    Upper body assist Assist Level: Moderate Assistance - Patient 50 - 74%    Lower Body Dressing/Undressing Lower body dressing      What is the patient wearing?: Pants, Underwear/pull up     Lower body assist Assist for lower body dressing: Moderate Assistance - Patient 50 - 74%     Toileting Toileting    Toileting assist Assist for toileting: Minimal Assistance - Patient > 75% Assistive Device Comment: RW   Transfers Chair/bed transfer  Transfers assist     Chair/bed transfer assist level: Contact Guard/Touching assist Chair/bed transfer assistive device: Armrests, Programmer, multimedia   Ambulation assist      Assist level: Contact Guard/Touching assist Assistive device: Walker-rolling Max distance: 132ft   Walk 10 feet activity   Assist     Assist level: Contact Guard/Touching assist Assistive device: Walker-rolling   Walk 50 feet activity  Assist    Assist level: Contact Guard/Touching assist Assistive device: Walker-rolling    Walk 150 feet activity   Assist    Assist level: Contact Guard/Touching assist Assistive device: Walker-rolling    Walk 10 feet on uneven surface  activity   Assist     Assist level: Minimal Assistance - Patient > 75% Assistive device: Horticulturist, commercial Will patient use wheelchair at discharge?: No             Wheelchair 50 feet with 2 turns activity    Assist            Wheelchair 150 feet activity     Assist          Blood pressure (!) 147/75, pulse 95, temperature 98.2 F (36.8 C), resp. rate 18, height 5\' 8"  (1.727 m), weight 104.1 kg, SpO2 93 %.  Medical Problem List and Plan: 1.  Decreased functional mobility secondary to acute on chronic back pain status post recent L2-3 fusion [id number] complicated by instrumentation failure with postoperative lumbar infection status post removal of previous hardware placement of posterior segmental instrumentation L1-S1 with pedicle screws L1, L2 revision biomechanical cage 07/24/2019.  Back brace when out of bed             -patient may not shower?- was suggested no shower, however if PICC and back covered, should be OK             -ELOS/Goals: 7-10 days/supervision/min a             Admit to CIR 2.  Antithrombotics: -DVT/anticoagulation: Lovenox.  Most recent vascular study negative             -antiplatelet therapy: N/A 3. Pain Management: MS Contin 15 mg every 12 hours, Cymbalta 60 mg daily, hydrocodone as needed, Valium as needed for muscle spasms  4/18- pain well controlled- con't regimen             Monitor with increased exertion 4. Mood: Provide emotional support             -antipsychotic agents: Seroquel 25 mg nightly 5. Neuropsych: This patient is capable of making decisions on her own behalf. 6. Skin/Wound Care: Routine skin checks 7. Fluids/Electrolytes/Nutrition: Routine in and outs.  CMP ordered.  4/18- labs tomorrow 8.  ID/MS coags negative staph.  Continue IV antibiotic Ancef x6 weeks initiated 07/11/2019  4/18- will need labs weekly while on Ancef 9.  Acute on chronic anemia.  CBC ordered.  4/17- labs Monday  10.  Hypertension.  Toprol 12.5 mg daily.  4/17- BP was 152/85 this AM, but down to 120/80s this afternoon- con't  regimen  4/18- BP 147/75 this AM- pulse 95- so will increase Metorpolol to 25 mg daily.              Monitor with increased activity. 11.  Chronic constipation.  Continue to adjust bowel program 12.  Hypothyroidism.  Synthroid 13.  History of breast cancer.  Follow-up outpatient Dr. Jana Hakim.Continue Arimidex 14.  History of restless leg syndrome.  Mysoline 50 mg nightly, Requip 0.75 mg nightly 15.  Hyperlipidemia.  Lipitor    LOS: 2 days A FACE TO FACE EVALUATION WAS PERFORMED  Tajee Savant 08/04/2019, 12:31 PM

## 2019-08-04 NOTE — Progress Notes (Signed)
Inpatient Rehabilitation  Patient information reviewed and entered into eRehab system by Jasiyah Poland M. Delois Tolbert, M.A., CCC/SLP, PPS Coordinator.  Information including medical coding, functional ability and quality indicators will be reviewed and updated through discharge.    

## 2019-08-05 ENCOUNTER — Inpatient Hospital Stay (HOSPITAL_COMMUNITY): Payer: PPO | Admitting: Physical Therapy

## 2019-08-05 ENCOUNTER — Inpatient Hospital Stay (HOSPITAL_COMMUNITY): Payer: PPO

## 2019-08-05 ENCOUNTER — Inpatient Hospital Stay (HOSPITAL_COMMUNITY): Payer: PPO | Admitting: Occupational Therapy

## 2019-08-05 DIAGNOSIS — E46 Unspecified protein-calorie malnutrition: Secondary | ICD-10-CM

## 2019-08-05 DIAGNOSIS — I10 Essential (primary) hypertension: Secondary | ICD-10-CM

## 2019-08-05 DIAGNOSIS — G894 Chronic pain syndrome: Secondary | ICD-10-CM

## 2019-08-05 DIAGNOSIS — M5416 Radiculopathy, lumbar region: Secondary | ICD-10-CM

## 2019-08-05 DIAGNOSIS — D649 Anemia, unspecified: Secondary | ICD-10-CM

## 2019-08-05 DIAGNOSIS — K5909 Other constipation: Secondary | ICD-10-CM

## 2019-08-05 DIAGNOSIS — R0989 Other specified symptoms and signs involving the circulatory and respiratory systems: Secondary | ICD-10-CM

## 2019-08-05 DIAGNOSIS — E8809 Other disorders of plasma-protein metabolism, not elsewhere classified: Secondary | ICD-10-CM

## 2019-08-05 LAB — COMPREHENSIVE METABOLIC PANEL
ALT: <5 U/L (ref 0–44)
AST: 19 U/L (ref 15–41)
Albumin: 2.4 g/dL — ABNORMAL LOW (ref 3.5–5.0)
Alkaline Phosphatase: 112 U/L (ref 38–126)
Anion gap: 11 (ref 5–15)
BUN: 9 mg/dL (ref 8–23)
CO2: 28 mmol/L (ref 22–32)
Calcium: 9.2 mg/dL (ref 8.9–10.3)
Chloride: 102 mmol/L (ref 98–111)
Creatinine, Ser: 0.7 mg/dL (ref 0.44–1.00)
GFR calc Af Amer: >60 mL/min (ref >60)
GFR calc non Af Amer: >60 mL/min (ref >60)
Glucose, Bld: 124 mg/dL — ABNORMAL HIGH (ref 70–99)
Potassium: 3.6 mmol/L (ref 3.5–5.1)
Sodium: 141 mmol/L (ref 135–145)
Total Bilirubin: <0.1 mg/dL — ABNORMAL LOW (ref 0.3–1.2)
Total Protein: 5.4 g/dL — ABNORMAL LOW (ref 6.5–8.1)

## 2019-08-05 LAB — CBC WITH DIFFERENTIAL/PLATELET
Abs Immature Granulocytes: 0.3 10*3/uL — ABNORMAL HIGH (ref 0.00–0.07)
Basophils Absolute: 0.1 10*3/uL (ref 0.0–0.1)
Basophils Relative: 2 %
Eosinophils Absolute: 0.6 10*3/uL — ABNORMAL HIGH (ref 0.0–0.5)
Eosinophils Relative: 7 %
HCT: 31.9 % — ABNORMAL LOW (ref 36.0–46.0)
Hemoglobin: 9.5 g/dL — ABNORMAL LOW (ref 12.0–15.0)
Immature Granulocytes: 4 %
Lymphocytes Relative: 20 %
Lymphs Abs: 1.7 10*3/uL (ref 0.7–4.0)
MCH: 28.4 pg (ref 26.0–34.0)
MCHC: 29.8 g/dL — ABNORMAL LOW (ref 30.0–36.0)
MCV: 95.5 fL (ref 80.0–100.0)
Monocytes Absolute: 0.6 10*3/uL (ref 0.1–1.0)
Monocytes Relative: 8 %
Neutro Abs: 4.9 10*3/uL (ref 1.7–7.7)
Neutrophils Relative %: 59 %
Platelets: 331 10*3/uL (ref 150–400)
RBC: 3.34 MIL/uL — ABNORMAL LOW (ref 3.87–5.11)
RDW: 16.5 % — ABNORMAL HIGH (ref 11.5–15.5)
WBC: 8.1 10*3/uL (ref 4.0–10.5)
nRBC: 0 % (ref 0.0–0.2)

## 2019-08-05 MED ORDER — POLYETHYLENE GLYCOL 3350 17 G PO PACK
17.0000 g | PACK | Freq: Two times a day (BID) | ORAL | Status: DC
  Administered 2019-08-05 – 2019-08-08 (×5): 17 g via ORAL
  Filled 2019-08-05 (×9): qty 1

## 2019-08-05 MED ORDER — SENNOSIDES-DOCUSATE SODIUM 8.6-50 MG PO TABS
2.0000 | ORAL_TABLET | Freq: Every day | ORAL | Status: DC
  Administered 2019-08-05 – 2019-08-09 (×5): 2 via ORAL
  Filled 2019-08-05 (×5): qty 2

## 2019-08-05 MED ORDER — PRO-STAT SUGAR FREE PO LIQD
30.0000 mL | Freq: Two times a day (BID) | ORAL | Status: DC
  Administered 2019-08-05 – 2019-08-09 (×6): 30 mL via ORAL
  Filled 2019-08-05 (×9): qty 30

## 2019-08-05 NOTE — Progress Notes (Signed)
[facility] PHYSICAL MEDICINE & REHABILITATION PROGRESS NOTE   Subjective/Complaints: Patient seen sitting up at the edge of her bed working with therapy this morning.  Good sitting balance noted.  She states she slept fairly overnight.  She states she had a good weekend.   ROS: Denies CP, SOB, N/V/D  Objective:   No results found. Recent Labs    08/02/19 2220 08/05/19 0422  WBC 10.3 8.1  HGB 9.5* 9.5*  HCT 31.8* 31.9*  PLT 300 331   Recent Labs    08/02/19 2220 08/05/19 0422  NA  --  141  K  --  3.6  CL  --  102  CO2  --  28  GLUCOSE  --  124*  BUN  --  9  CREATININE 0.75 0.70  CALCIUM  --  9.2    Intake/Output Summary (Last 24 hours) at 08/05/2019 1352 Last data filed at 08/05/2019 0828 Gross per 24 hour  Intake 210 ml  Output -  Net 210 ml     Physical Exam: Vital Signs Blood pressure 132/76, pulse 74, temperature 98.5 F (36.9 C), temperature source Oral, resp. rate 17, height 5\' 8"  (1.727 m), weight 104.1 kg, SpO2 95 %. Constitutional: No distress . Vital signs reviewed. HENT: Normocephalic.  Atraumatic. Eyes: EOMI. No discharge. Cardiovascular: No JVD. Respiratory: Normal effort.  No stridor. GI: Non-distended. Skin: Back incision with staples C/D/I Psych: Normal mood.  Normal behavior. Musc: Lower extremity edema, stable Neurological: Alert Motor: Bilateral upper extremities: 4/5 proximal distal Right lower extremity: Hip flexion 3+/5, knee extension 4-4+/5, ankle dorsiflexion 4+/5  Left lower extremity: Hip flexion 3 -/5, knee extension 4/5, ankle dorsiflexion 4+/5   Assessment/Plan: 1. Functional deficits secondary to lumbar radiculopathy status post lumbar fusion which requires 3+ hours per day of interdisciplinary therapy in a comprehensive inpatient rehab setting.  Physiatrist is providing close team supervision and 24 hour management of active medical problems listed below.  Physiatrist and rehab team continue to assess barriers to  discharge/monitor patient progress toward functional and medical goals  Care Tool:  Bathing    Body parts bathed by patient: Right arm, Left arm, Chest, Abdomen, Left upper leg, Right upper leg, Face   Body parts bathed by helper: Front perineal area, Buttocks, Right lower leg, Left lower leg     Bathing assist Assist Level: Moderate Assistance - Patient 50 - 74%     Upper Body Dressing/Undressing Upper body dressing Upper body dressing/undressing activity did not occur (including orthotics): N/A What is the patient wearing?: Bra, Pull over shirt    Upper body assist Assist Level: Moderate Assistance - Patient 50 - 74%    Lower Body Dressing/Undressing Lower body dressing      What is the patient wearing?: Underwear/pull up, Pants     Lower body assist Assist for lower body dressing: Moderate Assistance - Patient 50 - 74%     Toileting Toileting    Toileting assist Assist for toileting: Moderate Assistance - Patient 50 - 74% Assistive Device Comment: RW   Transfers Chair/bed transfer  Transfers assist     Chair/bed transfer assist level: Contact Guard/Touching assist Chair/bed transfer assistive device: Armrests, Programmer, multimedia   Ambulation assist      Assist level: Supervision/Verbal cueing Assistive device: Walker-rolling Max distance: 167ft   Walk 10 feet activity   Assist     Assist level: Supervision/Verbal cueing Assistive device: Walker-rolling   Walk 50 feet activity   Assist  Assist level: Supervision/Verbal cueing Assistive device: Walker-rolling    Walk 150 feet activity   Assist    Assist level: Supervision/Verbal cueing Assistive device: Walker-rolling    Walk 10 feet on uneven surface  activity   Assist     Assist level: Minimal Assistance - Patient > 75% Assistive device: Aeronautical engineer Will patient use wheelchair at discharge?: No             Wheelchair  50 feet with 2 turns activity    Assist            Wheelchair 150 feet activity     Assist          Blood pressure 132/76, pulse 74, temperature 98.5 F (36.9 C), temperature source Oral, resp. rate 17, height 5\' 8"  (1.727 m), weight 104.1 kg, SpO2 95 %.  Medical Problem List and Plan: 1.  Decreased functional mobility secondary to acute on chronic back pain status post recent L2-3 fusion [id number] complicated by instrumentation failure with postoperative lumbar infection status post removal of previous hardware placement of posterior segmental instrumentation L1-S1 with pedicle screws L1, L2 revision biomechanical cage 07/24/2019.  Back brace when out of bed  Continue CIR 2.  Antithrombotics: -DVT/anticoagulation: Lovenox.  Most recent vascular study negative             -antiplatelet therapy: N/A 3. Pain Management: MS Contin 15 mg every 12 hours, Cymbalta 60 mg daily, hydrocodone as needed, Valium as needed for muscle spasms  Controlled with meds on 4/19  Monitor with increased exertion 4. Mood: Provide emotional support             -antipsychotic agents: Seroquel 25 mg nightly 5. Neuropsych: This patient is capable of making decisions on her own behalf. 6. Skin/Wound Care: Routine skin checks 7. Fluids/Electrolytes/Nutrition: Routine in and outs.   8.  ID/MS coags negative staph.  Continue IV antibiotic Ancef x6 weeks initiated 07/11/2019 9.  Acute on chronic anemia.    Hemoglobin 9.5 on 4/19  Continue to monitor 10.  Hypertension.  Toprol 12.5 mg daily.  Labile on 4/19, monitor for trend             Monitor with increased activity. 11.  Chronic constipation.  Continue to adjust bowel program  Bowel meds increased on 4/19 12.  Hypothyroidism.  Synthroid 13.  History of breast cancer.  Follow-up outpatient Dr. Jana Hakim.Continue Arimidex 14.  History of restless leg syndrome.  Mysoline 50 mg nightly, Requip 0.75 mg nightly 15.  Hyperlipidemia.  Lipitor 16.  Severe  hypoalbuminemia  Supplement initiated on 4/19   LOS: 3 days A FACE TO FACE EVALUATION WAS PERFORMED   Lorie Phenix 08/05/2019, 1:52 PM

## 2019-08-05 NOTE — Progress Notes (Signed)
Occupational Therapy Session Note  Patient Details  Name: Brandi Stephens MRN: [id number] Date of Birth: 1946/04/01  Today's Date: 08/05/2019 OT Individual Time: [id number] OT Individual Time Calculation (min): 58 min   Short Term Goals: Week 1:  OT Short Term Goal 1 (Week 1): STGs = LTGs due to ELOS  Skilled Therapeutic Interventions/Progress Updates:    Pt greeted in bed, finished with eating breakfast. Wanting to have a BM as she hasn't had one in 5 days. After OT donned Teds and gripper socks, supine<sit completed with supervision assist for logroll technique. Once EOB, pt opted not to wear her brace for the bathroom transfer, which is ok per MD order. CGA for ambulatory transfer to the bathroom using RW. Pt able to lower her clothing today herself before sitting down. RN in at this time to administer pain medicine and provide suppository. OT also set pt up with warm prune juice to promote void. Pt was able to void bladder but not bowels. She wanted to transfer off of the toilet after a few minutes due to back discomfort, though OT had supported back with pillow. OT completed hygiene in the front and donned brief. Pt was then able to elevate underwear and pants herself. Pt completed ambulatory transfer to the w/c using device with CGA once again. She completed handwashing and makeup application with setup assistance while seated. Pt then reported stomach cramping and nausea, requesting emesis bag. CGA for ambulatory transfer back to toilet using RW. Max A for clothing mgt due to urgency. Pt left in care of NT to continue voiding, holding emesis bag.     Therapy Documentation Precautions:  Precautions Precautions: Back, Fall Required Braces or Orthoses: Spinal Brace Spinal Brace: Lumbar corset, Applied in sitting position Restrictions Weight Bearing Restrictions: No ADL: ADL Grooming: Setup Where Assessed-Grooming: Edge of bed Upper Body Bathing: Supervision/safety Where Assessed-Upper  Body Bathing: Edge of bed Lower Body Bathing: Maximal assistance Where Assessed-Lower Body Bathing: Edge of bed Upper Body Dressing: Moderate assistance Where Assessed-Upper Body Dressing: Edge of bed Lower Body Dressing: Maximal assistance Where Assessed-Lower Body Dressing: Edge of bed Toileting: Maximal assistance Where Assessed-Toileting: Glass blower/designer: Therapist, music Method: Ambulating(RW) Tub/Shower Transfer: Not assessed      Therapy/Group: Individual Therapy  Antonette Hendricks A Hansel Devan 08/05/2019, 12:24 PM

## 2019-08-05 NOTE — Progress Notes (Signed)
Physical Therapy Session Note  Patient Details  Name: Brandi Stephens MRN: [id number] Date of Birth: 1945/09/24  Today's Date: 08/05/2019 PT Individual Time: 1130-1155 PT Individual Time Calculation (min): 25 min   Short Term Goals: Week 1:  PT Short Term Goal 1 (Week 1): = to LTGs based on ELOS  Skilled Therapeutic Interventions/Progress Updates:    Patient received in bed, willing to participate but continuing to report pain and nausea, also frustration over constipation. Able to follow correct precautions to get to EOB with S-min guard, then consistently transferred with min guard and tolerated gait training [id number] with RW and S. Did need VC to maintain spinal precautions due to tendency to twist with RW during functional tasks. Got back to her room and required MinA for return to supine to maintain spine precautions. Performed quick vestibular screen, possibly does have some L beating nystagmus with head turns and definite dysmetria when trying to stay on target with vertical head shaking but will require a more in depth evaluation to confirm and begin to address- will reach out to primary therapist. Discussed implications of vestibular involvement as well as potential treatments. Patient left in bed with all needs met, bed alarm active and daughter present.   Therapy Documentation Precautions:  Precautions Precautions: Back, Fall Required Braces or Orthoses: Spinal Brace Spinal Brace: Lumbar corset, Applied in sitting position Restrictions Weight Bearing Restrictions: No Vital Signs:  Pain: Pain Assessment Pain Scale: 0-10 Pain Score: 5  Pain Type: Acute pain Pain Location: Back Pain Orientation: Lower Pain Descriptors / Indicators: Aching;Sore Pain Onset: On-going Patients Stated Pain Goal: 0 Pain Intervention(s): Repositioned;Ambulation/increased activity;Distraction    Therapy/Group: Individual Therapy   KWindell Norfolk DPT, PN1   Supplemental Physical  Therapist [place]os   Pager [phone] Rehab Office [phone]   08/05/2019, 12:42 PM

## 2019-08-05 NOTE — Progress Notes (Signed)
Patient's last bowel movement 4/15. Scheduled senna and colace given. Refuse suppository during the night. Patient prefers warm prune juice for now. Offered suppository this morning  and patient wanted to wait till after therapy is done for today.

## 2019-08-05 NOTE — Progress Notes (Signed)
Occupational Therapy Session Note  Patient Details  Name: Brandi Stephens MRN: [id number] Date of Birth: May 18, 1945  Today's Date: 08/05/2019 OT Individual Time: 1400-1500 OT Individual Time Calculation (min): 60 min    Short Term Goals: Week 1:  OT Short Term Goal 1 (Week 1): STGs = LTGs due to ELOS  Skilled Therapeutic Interventions/Progress Updates:     Pt seen this session to facilitate functional mobility of bed mobility focusing on back precautions, sit to stand to RW,  Standing balance with RW with marching,  Heel raises in standing.  Pt needed a few breaks but did well.  Pt requesting a shower tomorrow. Obtained a tub bench for her room. Pt needed to toilet at end of session. Ambulated to toilet and completed toilet transfer with CGA.  She was able to partially adjust clothing over hips with mod A.  Needed A with cleansing.  Pt ambulated back to bed and moved to supine with min A.  Pt resting in bed with daughter in the room. Bed alarm set and all needs met.  Therapy Documentation Precautions:  Precautions Precautions: Back, Fall Required Braces or Orthoses: Spinal Brace Spinal Brace: Lumbar corset, Applied in sitting position Restrictions Weight Bearing Restrictions: No    Vital Signs: Therapy Vitals Temp: 98.5 F (36.9 C) Temp Source: Oral Pulse Rate: 74 Resp: 17 BP: 132/76 Patient Position (if appropriate): Lying Oxygen Therapy SpO2: 95 % O2 Device: Room Air Pain: Pain Assessment Pain Scale: 0-10 Pain Score: 5  Pain Type: Acute pain Pain Location: Back Pain Orientation: Lower Pain Descriptors / Indicators: Aching;Sore Pain Onset: On-going Patients Stated Pain Goal: 0 Pain Intervention(s): Repositioned;Ambulation/increased activity;Distraction ADL: ADL Grooming: Setup Where Assessed-Grooming: Edge of bed Upper Body Bathing: Supervision/safety Where Assessed-Upper Body Bathing: Edge of bed Lower Body Bathing: Maximal assistance Where Assessed-Lower  Body Bathing: Edge of bed Upper Body Dressing: Moderate assistance Where Assessed-Upper Body Dressing: Edge of bed Lower Body Dressing: Maximal assistance Where Assessed-Lower Body Dressing: Edge of bed Toileting: Maximal assistance Where Assessed-Toileting: Glass blower/designer: Therapist, music Method: Ambulating(RW) Tub/Shower Transfer: Not assessed  Therapy/Group: Individual Therapy  Ailene Royal 08/05/2019, 3:23 PM

## 2019-08-05 NOTE — Progress Notes (Signed)
Physical Therapy Session Note  Patient Details  Name: Brandi Stephens MRN: [id number] Date of Birth: 1945/12/13  Today's Date: 08/05/2019 PT Individual Time: [id number] PT Individual Time Calculation (min): 45 min Missed time- 15 min (pain, nausea)   Short Term Goals: Week 1:  PT Short Term Goal 1 (Week 1): = to LTGs based on ELOS  Skilled Therapeutic Interventions/Progress Updates:    Pt supine in bed upon PT arrival, agreeable to therapy tx and reports pain in low back 5/10, repositioning and limiting activity for pain relief. Pt reports she has been constipated and has received a suppository already. Pt transferred to sitting with log roll technique and min assist, donned back brace with assist. Therapist assisted to doff grip socks and don shoes. Therapist retrieved cushion for w/c to promote better positioning and for comfort. Pt ambulated to w/c x 10 ft with RW and CGA, transported to the gym. In the gym pt ambulated x 120 ft, x 60 ft and x 60 ft this session with RW and CGA working on endurance and gait with cues for upright posture. Sit<>stands throughout session CGA with pt needing cues to push up from arm rest with one UE rather than pull up with RW. Pt ascended/descended 4 steps this session with B rails and CGA working on strength. Pt reports needing to try using bathroom. Pt transported back to room in w/c. Ambulated to bathroom x 10 ft with RW and CGA, assist for clothing management and pericare. Pt continent of small BM this session. Pt requesting to lay down as she now has increased pain from using bathroom and feels nauseas. Ambulated to bed with RW and CGA, transferred to supine min assist and left with needs in reach and bed alarm set. Pt missed 15 min of skilled therapy tx secondary to pain and nausea.   Therapy Documentation Precautions:  Precautions Precautions: Back, Fall Required Braces or Orthoses: Spinal Brace Spinal Brace: Lumbar corset, Applied in sitting  position Restrictions Weight Bearing Restrictions: No    Therapy/Group: Individual Therapy  Netta Corrigan, PT, DPT, CSRS 08/05/2019, 7:40 AM

## 2019-08-06 ENCOUNTER — Inpatient Hospital Stay (HOSPITAL_COMMUNITY): Payer: PPO | Admitting: Occupational Therapy

## 2019-08-06 ENCOUNTER — Inpatient Hospital Stay (HOSPITAL_COMMUNITY): Payer: PPO | Admitting: Physical Therapy

## 2019-08-06 DIAGNOSIS — G8918 Other acute postprocedural pain: Secondary | ICD-10-CM

## 2019-08-06 NOTE — Progress Notes (Signed)
Physical Therapy Session Note  Patient Details  Name: Brandi Stephens MRN: [id number] Date of Birth: 1945/09/08  Today's Date: 08/06/2019 PT Individual Time: 1135-1158 PT Individual Time Calculation (min): 23 min   Short Term Goals: Week 1:  PT Short Term Goal 1 (Week 1): = to LTGs based on ELOS  Skilled Therapeutic Interventions/Progress Updates:    Patient received in bed, having quite a bit of back pain but agreeable to central vestibular exam in bed- had to modify techniques as she did not want to get to EOB given her current high levels of back pain. Presentation MUCH better today- did not note any central symptoms whatsoever, which is great and easier to resolve if symptoms are not related to central causes. Spent quite a bit of time discussing risk vs reward of continuing vestibular treatments if the maneuvers are going to spike her back pain to this extent, versus following up with vestibular treatment in HHPT or OP PT settings; also discussed results of central vestibular testing and general performance today. Did mention that if she is still dizzy when being discharged, could ask attending MD about meclizine until HHPT or OP PT can follow up on symptoms. Left in bed with all needs met, bed alarm active and daughter present.   Therapy Documentation Precautions:  Precautions Precautions: Back, Fall Required Braces or Orthoses: Spinal Brace Spinal Brace: Lumbar corset, Applied in sitting position Restrictions Weight Bearing Restrictions: No Pain: Pain Assessment Pain Scale: 0-10 Pain Score: 9  Pain Type: Acute pain;Surgical pain Pain Location: Back Pain Orientation: Lower Pain Descriptors / Indicators: Aching;Sore Pain Onset: On-going Patients Stated Pain Goal: 3 Pain Intervention(s): Other (Comment);Repositioned(premedicated) Multiple Pain Sites: No    Therapy/Group: Individual Therapy   Windell Norfolk, DPT, PN1   Supplemental Physical Therapist [place]     Pager [phone] Acute Rehab Office [phone]    08/06/2019, 12:39 PM

## 2019-08-06 NOTE — Progress Notes (Signed)
Physical Therapy Session Note  Patient Details  Name: Brandi Stephens MRN: [id number] Date of Birth: [birth year]  Today's Date: 08/06/2019 PT Individual Time: [phone] and [id number] PT Individual Time Calculation (min): 38 min and 46 min   and  Today's Date: 08/06/2019 PT Missed Time: 22 Minutes Missed Time Reason: Pain  Short Term Goals: Week 1:  PT Short Term Goal 1 (Week 1): = to LTGs based on ELOS  Skilled Therapeutic Interventions/Progress Updates:    Session 1: Pt received sitting EOB with RN present and Dr. Posey Pronto exiting upon therapist's arrival. Pt reports she didn't sleep well due to being up/down all night to/from bathroom. Reports she is already having increased low back pain this morning - RN administered medications. Pt agreeable to therapy session and discussed focus of this AM session on vestibular evaluation due to pt experiencing dizziness with sit>supine and possible nystagmus noted.  H-test for smooth pursuit: normal, with slight nystagmus noted at end range but WNL Horizontal saccades: pt noted to blink each time she moved her eyes (states due to dryness) but with cuing able to minimize blinking and noted to be WNL Vertical saccades: after a few repetitions pt reports onset of dizziness when tracking superiorly - noted slight abnormal eye movement but pt closed eyes so difficult to determine  Performed modified Dix-Hallpike test via sit>sidelying: - Testing R side (head turned L to R sidelying): no symptoms - Testing L side (head turned R to L sidelying): pt reporting onset of dizziness and demonstrating positive upward torsional nystagmus that lasted <60seconds resulting in positive canalithiasis   Attempted modified Canalith Repositioning Maneuver to maintain spine precautions but unable to replicate nystagmus or symptoms and pt reporting too severe LBP with the rolling and sidelying<>sitting positions therefore unable to perform at this time.  Pt reports since her  pain was already increasing prior to start of therapy session she is in more pain now and requesting to rest. Sit>supine via reverse logroll technique and min assist for B LE management. Therapist supplied pt with ice to low back (notified RN). Pt left with needs in reach and bed alarm on. Missed 22 minutes of skilled physical therapy.    Session 2: Pt received supine in bed and agreeable to therapy session. Supine>R sidelying>sitting EOB via logroll technique with supervision and increased time/effort using bedrails. Pt states "Getting out of bed is so much easier now." Donned lumbar corset sitting EOB with set-up assist and worn remainder of session. Sit<>stands using RW with CGA for steadying and max cuing to push up with 1 hand from chair/bed rather than pulling up with both hands on RW. Gait ~81ft to bathroom using RW with CGA for steadying - demonstrates shuffled gait pattern with B LEs flexed throughout and increased B UE support/WBing on RW. Standing with CGA able to perform LB clothing management with min assist. Sit<>stands RW<>BSC over toilet with CGA for steadying. Continent of bladder and with encouragement for modified technique to avoid twisting pt able to perform anterior peri-care without assist. Standing hand hygiene at sink with CGA and max cuing for proper AD management at counter. Transported to/from gym in w/c for time management and energy conservation. Pt reports LBP rated 5-6/10 but pt agreeable to therapy session - provided seated rest breaks, emotional support, and distraction for pain management.  Gait training ~168ft x2 (seated break between) using RW with CGA for steadying - continues to demonstrate above impairments with shuffled gait and B LEs flexed throughout gait and increased  B UE support/WBing down through RW - cuing for improvement throughout but pt reports increased LBP being cause of deviations. Performed standing tolerance and B LE strengthening task focusing on decreased B  UE WBing through RW via alternate B LE foot taps on 4" step x15 reps - max cuing to decreased B UE WBing with pt demonstrating significant compensation - also demonstrates significant lack of gluteal muscle activation in stance. Educated pt on performing supine glute squeezes 5sec holds x10 reps in the bed. Transported back to room in w/c. Discussed follow-up recommendations of HHPT and DME needs with pt/daughter([name]) reporting pt not planning to perform community mobility for several weeks after discharge except to attend MD appointments therefore no w/c needed at this time. Pt reports her goal is still to sit upright for all 3 meals each day therefore agreeable to remain seated in w/c for ~61minutes - left with needs in reach and seat belt alarm on.  Therapy Documentation Precautions:  Precautions Precautions: Back, Fall Required Braces or Orthoses: Spinal Brace Spinal Brace: Lumbar corset, Applied in sitting position Restrictions Weight Bearing Restrictions: No  Pain:   Session 1: Details above.  Session 2: Reports pain level in low back 5-6/10 during session - provided seated rest breaks, emotional support, and distraction for pain management.    Therapy/Group: Individual Therapy  Tawana Scale, PT ,DPT 08/06/2019, 7:51 AM

## 2019-08-06 NOTE — Progress Notes (Signed)
Occupational Therapy Session Note  Patient Details  Name: Brandi Stephens MRN: [id number] Date of Birth: [birth year]  Today's Date: 08/06/2019 OT Individual Time: 1287-8676 and 7209-4709 OT Individual Time Calculation (min): 30 min and 70 min   Short Term Goals: Week 1:  OT Short Term Goal 1 (Week 1): STGs = LTGs due to ELOS  Skilled Therapeutic Interventions/Progress Updates:    Visit 1:  Pain: 10/10 Pt received in bed with her daughter in the room. Pt stating that she "tweaked" her back earlier during the PT eplimaneuver and now it is too painful to move.  Pt laying in an awkward position that she had gotten herself into. Obtained a heat pack and placed on her low back, adjusted pt in supine with pillows under knees and feet elevated.   Discussed her POC with her and her daughter and to relax now and hopefully she will feel well enough after lunch to take a shower.  Discussed her home bidet set up and daughter has placed toilet safety rails but needs a taller seat. Showed daughter clamp on toilet seats that she can purchase online.  Pt resting in bed with hot pack.  Will remove after lunch.   Visit 2: Pain:  8/10 pain, RN provided pain medication  Pt received in bed and ready for a shower.  Focused on bed mobility, moving slowly to ensure she comes into a full sidelying position and then receiving A to lower feet as she pushes up with arms.  This worked well and did not induce pain.    CGA with all sit to stands today to RW from bed, toilet, tub bench and w/c. CGA with ambulation in room with RW.    Pt first toileted and then moved into shower.  She continues to need A with LB care due to back precautions. She does have great difficulty with the reacher but may try a clothing hook.    Pt very fatigued at end of shower and after dressing and requested to lay down.  Worked on controlled movement with sit to sidelying which pt did well with min A to lift her legs.  Pt adjusted with several  pillows to support her body position.  Pt stated that her dizziness has really resolved and she does feel she will be ready to go home on Saturday.  Will discuss this in her pt care conference tomorrow to set her discharge date.   Pt resting in bed with all needs met. Bed alarm set.    Therapy Documentation Precautions:  Precautions Precautions: Back, Fall Required Braces or Orthoses: Spinal Brace Spinal Brace: Lumbar corset, Applied in sitting position Restrictions Weight Bearing Restrictions: No   ADL: ADL Grooming: Setup Where Assessed-Grooming: Edge of bed Upper Body Bathing: Supervision/safety Where Assessed-Upper Body Bathing: Edge of bed Lower Body Bathing: Maximal assistance Where Assessed-Lower Body Bathing: Edge of bed Upper Body Dressing: Moderate assistance Where Assessed-Upper Body Dressing: Edge of bed Lower Body Dressing: Maximal assistance Where Assessed-Lower Body Dressing: Edge of bed Toileting: Maximal assistance Where Assessed-Toileting: Glass blower/designer: Therapist, music Method: Ambulating(RW) Tub/Shower Transfer: Not assessed   Therapy/Group: Individual Therapy  Mckinlee Dunk 08/06/2019, 12:03 PM

## 2019-08-06 NOTE — Progress Notes (Signed)
[place] PHYSICAL MEDICINE & REHABILITATION PROGRESS NOTE   Subjective/Complaints: Patient seen ambulating back from the restroom this morning.  She states she slept well overnight.  Good sitting balance noted.  She notes improvement in bowel function and states she feels much better.  ROS: Denies CP, SOB, N/V/D  Objective:   No results found. Recent Labs    08/05/19 0422  WBC 8.1  HGB 9.5*  HCT 31.9*  PLT 331   Recent Labs    08/05/19 0422  NA 141  K 3.6  CL 102  CO2 28  GLUCOSE 124*  BUN 9  CREATININE 0.70  CALCIUM 9.2    Intake/Output Summary (Last 24 hours) at 08/06/2019 1244 Last data filed at 08/05/2019 1403 Gross per 24 hour  Intake 120 ml  Output --  Net 120 ml     Physical Exam: Vital Signs Blood pressure 105/64, pulse 80, temperature 98.3 F (36.8 C), resp. rate 19, height 5\' 8"  (1.727 m), weight 104.1 kg, SpO2 95 %. Constitutional: No distress . Vital signs reviewed. HENT: Normocephalic.  Atraumatic. Eyes: EOMI. No discharge. Cardiovascular: No JVD. Respiratory: Normal effort.  No stridor. GI: Non-distended. Skin: Back incision not examined today. Psych: Normal mood.  Normal behavior. Musc: Lower extremity edema stable Neurological: Alert  Motor: Bilateral upper extremities: 4/5 proximal distal Right lower extremity: Hip flexion 3+/5, knee extension 4-4+/5, ankle dorsiflexion 4+/5  Left lower extremity: Hip flexion 3 -/5, knee extension 4/5, ankle dorsiflexion 4+/5, unchanged  Assessment/Plan: 1. Functional deficits secondary to lumbar radiculopathy status post lumbar fusion which requires 3+ hours per day of interdisciplinary therapy in a comprehensive inpatient rehab setting.  Physiatrist is providing close team supervision and 24 hour management of active medical problems listed below.  Physiatrist and rehab team continue to assess barriers to discharge/monitor patient progress toward functional and medical goals  Care Tool:  Bathing     Body parts bathed by patient: Right arm, Left arm, Chest, Abdomen, Left upper leg, Right upper leg, Face   Body parts bathed by helper: Front perineal area, Buttocks, Right lower leg, Left lower leg     Bathing assist Assist Level: Moderate Assistance - Patient 50 - 74%     Upper Body Dressing/Undressing Upper body dressing Upper body dressing/undressing activity did not occur (including orthotics): N/A What is the patient wearing?: Bra, Pull over shirt    Upper body assist Assist Level: Moderate Assistance - Patient 50 - 74%    Lower Body Dressing/Undressing Lower body dressing      What is the patient wearing?: Incontinence brief, Pants, Underwear/pull up     Lower body assist Assist for lower body dressing: Moderate Assistance - Patient 50 - 74%     Toileting Toileting    Toileting assist Assist for toileting: Moderate Assistance - Patient 50 - 74% Assistive Device Comment: RW   Transfers Chair/bed transfer  Transfers assist     Chair/bed transfer assist level: Minimal Assistance - Patient > 75% Chair/bed transfer assistive device: Armrests, Programmer, multimedia   Ambulation assist      Assist level: Supervision/Verbal cueing Assistive device: Walker-rolling Max distance: 132ft   Walk 10 feet activity   Assist     Assist level: Supervision/Verbal cueing Assistive device: Walker-rolling   Walk 50 feet activity   Assist    Assist level: Supervision/Verbal cueing Assistive device: Walker-rolling    Walk 150 feet activity   Assist    Assist level: Supervision/Verbal cueing Assistive device: Walker-rolling  Walk 10 feet on uneven surface  activity   Assist     Assist level: Minimal Assistance - Patient > 75% Assistive device: Aeronautical engineer Will patient use wheelchair at discharge?: No             Wheelchair 50 feet with 2 turns activity    Assist            Wheelchair  150 feet activity     Assist          Blood pressure 105/64, pulse 80, temperature 98.3 F (36.8 C), resp. rate 19, height 5\' 8"  (1.727 m), weight 104.1 kg, SpO2 95 %.  Medical Problem List and Plan: 1.  Decreased functional mobility secondary to acute on chronic back pain status post recent L2-3 fusion [id number] complicated by instrumentation failure with postoperative lumbar infection status post removal of previous hardware placement of posterior segmental instrumentation L1-S1 with pedicle screws L1, L2 revision biomechanical cage 07/24/2019.  Back brace when out of bed  Continue CIR  Function improving, therapy notes reviewed-ambulating 70 feet with supervision. 2.  Antithrombotics: -DVT/anticoagulation: Lovenox.  Most recent vascular study negative             -antiplatelet therapy: N/A 3. Pain Management: MS Contin 15 mg every 12 hours, Cymbalta 60 mg daily, hydrocodone as needed, Valium as needed for muscle spasms  Controlled with meds on 4/20  Monitor with increased exertion 4. Mood: Provide emotional support             -antipsychotic agents: Seroquel 25 mg nightly 5. Neuropsych: This patient is capable of making decisions on her own behalf. 6. Skin/Wound Care: Routine skin checks 7. Fluids/Electrolytes/Nutrition: Routine in and outs.   8.  ID/MS coags negative staph.  Continue IV antibiotic Ancef x6 weeks initiated 07/11/2019 9.  Acute on chronic anemia.    Hemoglobin 9.5 on 4/19  Continue to monitor 10.  Hypertension.  Toprol 12.5 mg daily.  Controlled on 4/20             Monitor with increased activity. 11.  Chronic constipation.  Continue to adjust bowel program  Bowel meds increased on 4/19  Improving 12.  Hypothyroidism.  Synthroid 13.  History of breast cancer.  Follow-up outpatient Dr. Jana Hakim.Continue Arimidex 14.  History of restless leg syndrome.  Mysoline 50 mg nightly, Requip 0.75 mg nightly 15.  Hyperlipidemia.  Lipitor 16.  Severe  hypoalbuminemia  Continue protein supplement   LOS: 4 days A FACE TO FACE EVALUATION WAS PERFORMED  Vadis Slabach Lorie Phenix 08/06/2019, 12:44 PM

## 2019-08-06 NOTE — Plan of Care (Signed)
  Problem: Consults Goal: RH GENERAL PATIENT EDUCATION Description: See Patient Education module for education specifics. Outcome: Progressing   Problem: RH BOWEL ELIMINATION Goal: RH STG MANAGE BOWEL WITH ASSISTANCE Description: STG Manage Bowel with mod I Assistance. Outcome: Progressing Goal: RH STG MANAGE BOWEL W/MEDICATION W/ASSISTANCE Description: STG Manage Bowel with Medication with mod I Assistance. Outcome: Progressing   Problem: RH SKIN INTEGRITY Goal: RH STG SKIN FREE OF INFECTION/BREAKDOWN Description: Pt will be free of skin breakdown and infection prior to DC with supervision assist Outcome: Progressing   Problem: RH PAIN MANAGEMENT Goal: RH STG PAIN MANAGED AT OR BELOW PT'S PAIN GOAL Description: Less than 4 on 0-10 scale Outcome: Progressing

## 2019-08-07 ENCOUNTER — Inpatient Hospital Stay (HOSPITAL_COMMUNITY): Payer: PPO | Admitting: Occupational Therapy

## 2019-08-07 ENCOUNTER — Inpatient Hospital Stay (HOSPITAL_COMMUNITY): Payer: PPO

## 2019-08-07 DIAGNOSIS — G8918 Other acute postprocedural pain: Secondary | ICD-10-CM

## 2019-08-07 DIAGNOSIS — I10 Essential (primary) hypertension: Secondary | ICD-10-CM

## 2019-08-07 MED ORDER — QUETIAPINE FUMARATE 25 MG PO TABS
12.5000 mg | ORAL_TABLET | Freq: Every day | ORAL | Status: DC
  Administered 2019-08-07 – 2019-08-09 (×3): 12.5 mg via ORAL
  Filled 2019-08-07 (×3): qty 1

## 2019-08-07 MED ORDER — CEFAZOLIN SODIUM-DEXTROSE 2-4 GM/100ML-% IV SOLN: INTRAVENOUS | 1 refills | Status: DC

## 2019-08-07 MED ORDER — CEFAZOLIN IV (FOR PTA / DISCHARGE USE ONLY): 2.0000 g | Freq: Three times a day (TID) | INTRAVENOUS | 0 refills | Status: DC

## 2019-08-07 NOTE — Progress Notes (Signed)
Staples removed, no drainage noted, applied foam dressing for comfort at pt request.

## 2019-08-07 NOTE — Progress Notes (Signed)
Occupational Therapy Session Note  Patient Details  Name: Brandi Stephens MRN: [id number] Date of Birth: Jul 04, 1945  Today's Date: 08/07/2019 OT Individual Time: 1015-1100 and 1300-1345 OT Individual Time Calculation (min): 45 min and 45 min   Short Term Goals: Week 1:  OT Short Term Goal 1 (Week 1): STGs = LTGs due to ELOS  Skilled Therapeutic Interventions/Progress Updates:    Visit 1: Pain: 6/10 pain in back, premedicated  Pt seen this session with her daughter to address toileting concerns as pt unable to reach.  Practiced in a simulated manner with a 15 inch toilet aid.  Only 12 inch toilet aids are available at this time. Obtained a 15 inch to use as a sample in a simulated manner.   To prep for this, pt worked on bed mobility of rolling to R and then sitting up. Pt able to do this with S but moved quickly and then became dizzy. Pt needed to sit for several minutes.  She worked on sit to stands from [employer] to [employer] with S.  Practiced a small squat and reaching behind with toilet aid to be able to reach without twisting.  Reviewed various types of toilet aids and other techniques for cleansing and drying.   To move to supine, worked visual fixation. Pt moved into sidelying but had difficulty with lifting legs into bed and needed min A.  She then rolled into supine.   Daughter discussed her concerns with her mother's cognition as she is noticing increased confusion and difficulty mobility.  Pt resting in bed with all needs met.     Visit 2:  Pain: 5/10 pain in back - pt said pain is tolerable  Pt received in bed and ready for therapy. She is feeling better this afternoon and was able to sit to EOB with S, stand with S, and ambulate to bathroom with RW with S.  Did find 15 inch toilet aid for pt, she was able to get pants down over hips safely, and then practiced with toilet aid and her wet wipes.  She was able to cleanse well using a small squat in standing.  Before pulling up pants,  practiced fully doffing and donning using a clothing stick vs the reacher as she has had difficulty with the reacher previously.  She doffed pants easily but needed min A to fully get over feet.  She was then able to pull over her hips.  Pt then ambulated back to EOB and was able to move to sidelying with S only and then to supine.  Pt is moving well this afternoon.  Pt resting in bed with all needs met and bed alarm on.   Therapy Documentation Precautions:  Precautions Precautions: Back, Fall Required Braces or Orthoses: Spinal Brace Spinal Brace: Lumbar corset, Applied in sitting position Restrictions Weight Bearing Restrictions: No    Pain: Pain Assessment Pain Scale: 0-10 Pain Score: 6  Pain Type: Acute pain;Surgical pain Pain Location: Back Pain Orientation: Lower Pain Descriptors / Indicators: Aching;Discomfort;Dull;Sore Pain Frequency: Constant Pain Onset: On-going Pain Intervention(s): Medication (See eMAR) ADL: ADL Grooming: Setup Where Assessed-Grooming: Edge of bed Upper Body Bathing: Supervision/safety Where Assessed-Upper Body Bathing: Edge of bed Lower Body Bathing: Maximal assistance Where Assessed-Lower Body Bathing: Edge of bed Upper Body Dressing: Moderate assistance Where Assessed-Upper Body Dressing: Edge of bed Lower Body Dressing: Maximal assistance Where Assessed-Lower Body Dressing: Edge of bed Toileting: Maximal assistance Where Assessed-Toileting: Glass blower/designer: Therapist, music Method: Ambulating(RW) Tub/Shower Transfer:  Not assessed   Therapy/Group: Individual Therapy  Shar Paez 08/07/2019, 10:11 AM

## 2019-08-07 NOTE — Progress Notes (Addendum)
[facility] PHYSICAL MEDICINE & REHABILITATION PROGRESS NOTE   Subjective/Complaints: Patient seen laying in bed this AM.  She states she slept very well overnight.  She has questions regarding progress.   ROS: Denies CP, SOB, N/V/D  Objective:   No results found. Recent Labs    08/05/19 0422  WBC 8.1  HGB 9.5*  HCT 31.9*  PLT 331   Recent Labs    08/05/19 0422  NA 141  K 3.6  CL 102  CO2 28  GLUCOSE 124*  BUN 9  CREATININE 0.70  CALCIUM 9.2    Intake/Output Summary (Last 24 hours) at 08/07/2019 1009 Last data filed at 08/07/2019 0841 Gross per 24 hour  Intake 342 ml  Output --  Net 342 ml     Physical Exam: Vital Signs Blood pressure 131/87, pulse 85, temperature 98.2 F (36.8 C), resp. rate 18, height 5\' 8"  (1.727 m), weight 104.1 kg, SpO2 92 %. Constitutional: No distress . Vital signs reviewed. HENT: Normocephalic.  Atraumatic. Eyes: EOMI. No discharge. Cardiovascular: No JVD. Respiratory: Normal effort.  No stridor. GI: Non-distended. Skin: Back incision not examined today Psych: Normal mood.  Normal behavior. Musc: Lower extremity edema, unchanged Neurological: Alert Motor: Bilateral upper extremities: 4/5 proximal distal Right lower extremity: Hip flexion 3+/5, knee extension 4+/5, ankle dorsiflexion 4+/5  Left lower extremity: Hip flexion 3 -/5, knee extension 4+/5, ankle dorsiflexion 4+/5  Assessment/Plan: 1. Functional deficits secondary to lumbar radiculopathy status post lumbar fusion which requires 3+ hours per day of interdisciplinary therapy in a comprehensive inpatient rehab setting.  Physiatrist is providing close team supervision and 24 hour management of active medical problems listed below.  Physiatrist and rehab team continue to assess barriers to discharge/monitor patient progress toward functional and medical goals  Care Tool:  Bathing    Body parts bathed by patient: Right arm, Left arm, Chest, Abdomen, Left upper leg, Right  upper leg, Face, Right lower leg, Left lower leg, Front perineal area   Body parts bathed by helper: Buttocks     Bathing assist Assist Level: Moderate Assistance - Patient 50 - 74%     Upper Body Dressing/Undressing Upper body dressing Upper body dressing/undressing activity did not occur (including orthotics): N/A What is the patient wearing?: Bra, Pull over shirt    Upper body assist Assist Level: Set up assist    Lower Body Dressing/Undressing Lower body dressing      What is the patient wearing?: Underwear/pull up, Pants     Lower body assist Assist for lower body dressing: Moderate Assistance - Patient 50 - 74%     Toileting Toileting    Toileting assist Assist for toileting: Moderate Assistance - Patient 50 - 74% Assistive Device Comment: RW   Transfers Chair/bed transfer  Transfers assist     Chair/bed transfer assist level: Supervision/Verbal cueing Chair/bed transfer assistive device: Armrests, Programmer, multimedia   Ambulation assist      Assist level: Supervision/Verbal cueing Assistive device: Walker-rolling Max distance: 180 ft   Walk 10 feet activity   Assist     Assist level: Supervision/Verbal cueing Assistive device: Walker-rolling   Walk 50 feet activity   Assist    Assist level: Supervision/Verbal cueing Assistive device: Walker-rolling    Walk 150 feet activity   Assist    Assist level: Supervision/Verbal cueing Assistive device: Walker-rolling    Walk 10 feet on uneven surface  activity   Assist     Assist level: Minimal Assistance - Patient >  75% Assistive device: Aeronautical engineer Will patient use wheelchair at discharge?: No             Wheelchair 50 feet with 2 turns activity    Assist            Wheelchair 150 feet activity     Assist          Blood pressure 131/87, pulse 85, temperature 98.2 F (36.8 C), resp. rate 18, height 5\' 8"   (1.727 m), weight 104.1 kg, SpO2 92 %.  Medical Problem List and Plan: 1.  Decreased functional mobility secondary to acute on chronic back pain status post recent L2-3 fusion 123XX123 complicated by instrumentation failure with postoperative lumbar infection status post removal of previous hardware placement of posterior segmental instrumentation L1-S1 with pedicle screws L1, L2 revision biomechanical cage 07/24/2019.  Back brace when out of bed  Continue CIR  Team conference today to discuss current and goals and coordination of care, home and environmental barriers, and discharge planning with nursing, case manager, and therapies.  2.  Antithrombotics: -DVT/anticoagulation: Lovenox.  Most recent vascular study negative             -antiplatelet therapy: N/A 3. Pain Management:   MS Contin 15 mg every 12 hours d/ced on 4/21  Cymbalta 60 mg daily, hydrocodone as needed, Valium as needed for muscle spasms  Controlled with meds on 4/21  Monitor with increased exertion 4. Mood: Provide emotional support             -antipsychotic agents: Seroquel 25 mg nightly decreased to 12.5 on 4/21 due to ?confusion 5. Neuropsych: This patient is capable of making decisions on her own behalf. 6. Skin/Wound Care: Routine skin checks 7. Fluids/Electrolytes/Nutrition: Routine in and outs.   8.  ID/MS coags negative staph.  Continue IV antibiotic Ancef x6 weeks initiated 07/11/2019 9.  Acute on chronic anemia.    Hemoglobin 9.5 on 4/19  Continue to monitor 10.  Hypertension.  Toprol 12.5 mg daily.  Labile on 4/21             Monitor with increased activity. 11.  Chronic constipation.  Continue to adjust bowel program  Bowel meds increased on 4/19  Improving 12.  Hypothyroidism.  Synthroid 13.  History of breast cancer.  Follow-up outpatient Dr. Jana Hakim.Continue Arimidex 14.  History of restless leg syndrome.  Mysoline 50 mg nightly, Requip 0.75 mg nightly 15.  Hyperlipidemia.  Lipitor 16.  Severe  hypoalbuminemia  Continue protein supplement   LOS: 5 days A FACE TO FACE EVALUATION WAS PERFORMED  Kamber Vignola Lorie Phenix 08/07/2019, 10:09 AM

## 2019-08-07 NOTE — Progress Notes (Signed)
PHARMACY CONSULT NOTE FOR:  OUTPATIENT  PARENTERAL ANTIBIOTIC THERAPY (OPAT)  Indication: Osteomyelitis  Regimen: Cefazolin 2g IV every 8 hours End date: 08/21/2019  IV antibiotic discharge orders are pended. To discharging provider:  please sign these orders via discharge navigator,  Select New Orders & click on the button choice - Manage This Unsigned Work.     Thank you for allowing pharmacy to be a part of this patient's care.  Antonietta Jewel, PharmD, [place] Clinical Pharmacist  Phone: [phone] 08/07/2019 1:58 PM  Please check AMION for all [place] phone numbers After 10:00 PM, call [place] [phone]

## 2019-08-07 NOTE — Progress Notes (Signed)
Physical Therapy Session Note  Patient Details  Name: Brandi Stephens MRN: [id number] Date of Birth: [birth year]  Today's Date: 08/07/2019 PT Individual Time: 0802-0900 and 1415-1445 PT Individual Time Calculation (min): 58 min and 30 min Missed time- 15 min (pain/fatigue)    Short Term Goals: Week 1:  PT Short Term Goal 1 (Week 1): = to LTGs based on ELOS  Skilled Therapeutic Interventions/Progress Updates:    Session 1: Pt supine in bed upon PT arrival, agreeable to therapy tx and reports pain 5/10 in back - repositioning during session for pain relief. Pt transferred to sitting EOB via log roll technique with supervision, sitting EOB therapist assisted to don teds and shoes. Pt reports having to use the bathroom this session, ambulated from bed<>bathroom this session 2 x 10 ft with RW and supervision, assist with clothing management and pericare. Seated in w/c pt donned back brace with assist. Pt reports intermittent dizziness throughout session, will follow up outpatient for vestibular treatment. Pt ambulated to the gym x 180 ft with RW and supervision, cues for RW management and upright posture. Pt continues to be limited by low back pain during session, requiring increased time between activities for therapeutic rest break - during breaks discussed d/c planning and HEP. Pt ambulated to parallel bars with supervision, within the parallel bars pt performed the following exercises for strengthening - sidestepping 4 x 8 ft, standing calf raises 2 x 15, standing mini squats 2 x 10 and standing marches x 10 per LE. Pt ambulated x 50 ft and x 45 ft back towards room with RW and supervision, transported the rest of the way to room in w/c, limited by pain. Pt ambulated in/out of bathroom again this session with RW, unable to void, assisted with clothing management. Pt transferred to bed and left supine with needs in reach and bed alarm set.   Session 2: Pt supine in bed upon PT arrival, agreeable to  therapy tx and reports pain 3/10 - pt just got her staples out. Pt transferred to sitting with log roll technique and supervision. Therapist assisted to don shoes total assist. Pt ambulated from room>gym x 200 ft with RW and supervision, cues for RW management and upright posture. Pt ascended/descended 12 steps this session using B rails per home set up with supervision, step to and reciprocal pattern used depending on fatigue, cues for safety and techniques. Pt does report increase in pain following this. Pt continued to work on ambulation this session x 100 ft with RW and supervision, pt reports increased pain/fatigue/dizziness and requesting to lay down. Pt transported back to room and ambulated to bed, sit>supine log roll technique supervision. Pt left supine with needs in reach and bed alarm set. Pt missed 15 minutes of skilled therapy tx this session due to pain/fatigue/dizziness.    Therapy Documentation Precautions:  Precautions Precautions: Back, Fall Required Braces or Orthoses: Spinal Brace Spinal Brace: Lumbar corset, Applied in sitting position Restrictions Weight Bearing Restrictions: No    Therapy/Group: Individual Therapy  Netta Corrigan, PT, DPT, CSRS 08/07/2019, 7:55 AM

## 2019-08-07 NOTE — Patient Care Conference (Signed)
Inpatient RehabilitationTeam Conference and Plan of Care Update Date: 08/07/2019   Time: 11:10 AM    Patient Name: Brandi Stephens      Medical Record Number: [id number]  Date of Birth: May 18, 1945 Sex: Female         Room/Bed: 4W26C/4W26C-01 Payor Info: Payor: [employer] / Plan: [employer] Must PPO / Product Type: *No Product type* /    Admit Date/Time:  08/02/2019  2:26 PM  Primary Diagnosis:  Lumbar radiculopathy  Patient Active Problem List   Diagnosis Date Noted  . Benign essential HTN   . Post-operative pain   . Chronic constipation   . 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 Post op infection 07/11/2019  . Muscle spasm   . Hypokalemia   . Status post lumbar spinal fusion 07/10/2019  . Postoperative pain   . Acute blood loss anemia   . Labile blood pressure   . Tobacco abuse   . Hypoalbuminemia due to protein-calorie malnutrition ([place])   . Anxiety 07/05/2019  . Depression 07/05/2019  . Chronic diastolic CHF (congestive heart failure) ([place]) 07/05/2019  . Obesity (BMI 30-39.9) 07/05/2019  . ETOH abuse 07/05/2019  . Lumbar radiculopathy 06/28/2019  . Spinal stenosis of lumbar region 06/28/2019  . Acute on chronic anemia   . Tremors of nervous system   . AKI (acute kidney injury) ([place])   . Chronic pain syndrome   . Acute renal failure superimposed on stage 3 chronic kidney disease ([place]) 06/25/2019  . Acute osteomyelitis of lumbar spine ([place]) 06/25/2019  . Weakness of left lower extremity   . Abdominal pain 06/24/2019  . Nausea 06/24/2019  . Abnormal urine odor 06/24/2019  . History of sepsis 06/24/2019  . Recent major surgery 06/24/2019  . History of diverticulosis 06/24/2019  . Elevated PTHrP level 05/09/2019  . Paresthesia 02/12/2019  . Low back pain 02/12/2019  . Neuropathy 12/20/2018  . Lumbar spinal stenosis 06/29/2018  . Preoperative clearance 06/04/2018  . Abnormal CT of the chest 06/04/2018  . Smoker 05/17/2018  . Hypothyroidism 11/30/2017   . Thoracic aortic atherosclerosis ([place]) 09/14/2016  . Benign essential tremor 09/09/2016  . RLS (restless legs syndrome) 09/09/2016  . H/O adenomatous polyp of colon 07/06/2016  . PVC's (premature ventricular contractions) 03/29/2016  . Preoperative cardiovascular examination 03/29/2016  . Grade I diastolic dysfunction 0000000  . Palpitations 01/11/2016  . Shortness of breath 01/11/2016  . Former cigarette smoker 01/11/2016  . Leg edema 01/11/2016  . Chronic cough 10/28/2015  . 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 Malignant neoplasm of upper-inner quadrant of left breast in female, estrogen receptor positive ([place]) 07/14/2015  . Prediabetes 07/08/2015  . CKD (chronic kidney disease), stage III 07/08/2015  . Dependent edema 06/08/2015  . Sleep disturbance 06/08/2015  . Mixed hyperlipidemia 06/08/2015  . Generalized anxiety disorder 06/08/2015  . Essential hypertension 06/08/2015  . COPD (chronic obstructive pulmonary disease) ([place]) 06/08/2015  . Gout 06/08/2015  . Macrocytic anemia   . 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 Sepsis ([place]) 05/15/2015  . Lumbar spondylosis 03/20/2015    Expected Discharge Date: Expected Discharge Date: 08/10/19  Team Members Present: Physician leading conference: Dr. Delice Lesch Care Coodinator Present: Nestor Lewandowsky, RN, BSN, CRRN;Christina Sampson Goon, BSW;Genie Rilei Kravitz, RN, MSN Nurse Present: Isla Pence, RN PT Present: Michaelene Song, PT OT Present: Meriel Pica, OT SLP Present: Colon Flattery, SLP PPS Coordinator present : Ileana Ladd, PT     Current Status/Progress Goal Weekly Team Focus  Bowel/Bladder   Patient is continent of  bowel and bladder  maintain continence of bowel and bladder  Assess patient with toileting needs   Swallow/Nutrition/ Hydration             ADL's   set up UB bathing and dressing; min A LB bathing with long sponge, mod A LB dressing; mod A toileting; CGA toilet transfer and sit to stand  min A  toileting, LB dressing, bathing;    S UB dressing, toilet transfer, sit to stand  ADL training, functional mobility, pt education, AE training   Mobility   min assist supine<>sit, CGA sit<>stand and stand pivot transfers using RW, gait up to 137ft using RW with CGA, 4 steps using B HRs with CGA  supervision overall at ambulatory level up to 162ft as well as performing 12 step navigation using B HRs  pain management, pt/family education, activity tolerance, bed mobility training, gait training, stair navigation, BLE strengthening   Communication             Safety/Cognition/ Behavioral Observations            Pain   Patient c/o mild discomfort to back  maintain pain <3  assess q shift and PRN pain   Skin   back incision is healing  prevent from breakdwon  q shift and PRN skin assessment    Rehab Goals Patient on target to meet rehab goals: Yes *See Care Plan and progress notes for long and short-term goals.     Barriers to Discharge  Current Status/Progress Possible Resolutions Date Resolved   Nursing                  PT  Inaccessible home environment;Home environment access/layout;IV antibiotics  bed/bath on 2nd floor              OT                  SLP                SW Home environment access/layout;IV antibiotics 2 level home with 2 step entry with B+B on 2nd level with14 steps up to 2nd level IV abx x 6 weeks; will need Home infusion set up and training for spouse and daughters          Discharge Planning/Teaching Needs:  Home with spouse and daughter  TBD; transfers, toileting, medications, etc   Team Discussion: MD pain better, monitoring BP, monitoring labs, IV abx until 5/6.  RN BM 4/20, staples back can DC per MD, TLSO brace.  OT transfers CGA, sit to stand S, dizzyness, slow transitions, ?neuropsych to see before DC?  PT amb S to gym, back pain, BPPV.  SLP none.   Revisions to Treatment Plan: N/A     Medical Summary Current Status: Decreased functional mobility  secondary to acute on chronic back pain status post recent L2-3 fusion [id number] complicated by instrumentation failure with postoperative lumbar infection status post removal of previous hardware placement of posterior segmental instrumentation L1-S1 with pedicle screws L1, L2 revision biomechanical cage 07/24/2019 Weekly Focus/Goal: Improve mobility, pain, HTN, acute on chronic anemia  Barriers to Discharge: Medical stability;Weight;IV antibiotics   Possible Resolutions to Barriers: Therapies, wean/optimize pain meds, optimize BP meds, follow labs   Continued Need for Acute Rehabilitation Level of Care: The patient requires daily medical management by a physician with specialized training in physical medicine and rehabilitation for the following reasons: Direction of a multidisciplinary physical rehabilitation program to maximize functional independence : Yes Medical  management of patient stability for increased activity during participation in an intensive rehabilitation regime.: Yes Analysis of laboratory values and/or radiology reports with any subsequent need for medication adjustment and/or medical intervention. : Yes   I attest that I was present, lead the team conference, and concur with the assessment and plan of the team.   Jodell Cipro M 08/07/2019, 2:16 PM   Team conference was held via web/ teleconference due to [place] - 19

## 2019-08-08 ENCOUNTER — Inpatient Hospital Stay (HOSPITAL_COMMUNITY): Payer: PPO | Admitting: Occupational Therapy

## 2019-08-08 ENCOUNTER — Ambulatory Visit: Payer: PPO | Admitting: Internal Medicine

## 2019-08-08 ENCOUNTER — Inpatient Hospital Stay (HOSPITAL_COMMUNITY): Payer: PPO

## 2019-08-08 DIAGNOSIS — T8140XA Infection following a procedure, unspecified, initial encounter: Secondary | ICD-10-CM

## 2019-08-08 DIAGNOSIS — R410 Disorientation, unspecified: Secondary | ICD-10-CM

## 2019-08-08 NOTE — Progress Notes (Signed)
Occupational Therapy Discharge Summary  Patient Details  Name: Brandi Stephens MRN: [id number] Date of Birth: October 23, 1945   Patient has met 8 of 8 long term goals due to improved activity tolerance, balance, ability to compensate for deficits.  Patient to discharge at overall Supervision-Min A level with her self care. [name] has been present for several OT sessions and has participated in hands on family education.    [name], [name], is independent to provide the necessary physical assistance at discharge.    Reasons goals not met: n/a  Recommendation:  Patient will benefit from ongoing skilled OT services in home health setting to continue to advance functional skills in the area of BADL and iADL.  Equipment: Long bath sponge, long toilet tongs, dressing stick, walker and reacher bags  Reasons for discharge: treatment goals met  Patient/family agrees with progress made and goals achieved: Yes  OT Discharge Precautions/Restrictions  Precautions Precautions: Back;Fall Required Braces or Orthoses: Spinal Brace Spinal Brace: Lumbar corset;Applied in sitting position Restrictions Weight Bearing Restrictions: No ADL ADL Eating: Independent Grooming: Independent Where Assessed-Grooming: Sitting at sink Upper Body Bathing: Supervision/safety Where Assessed-Upper Body Bathing: Shower Lower Body Bathing: Minimal assistance  Where Assessed-Lower Body Bathing: Shower Upper Body Dressing: Supervision/safety Where Assessed-Upper Body Dressing: Edge of bed Lower Body Dressing: Minimal assistance Where Assessed-Lower Body Dressing: Edge of bed Toileting: Supervision/safety-Minimal assistance  Where Assessed-Toileting: Glass blower/designer: Close supervision Armed forces technical officer Method: Counselling psychologist: Grab bars, Raised toilet seat Tub/Shower Transfer: Not assessed Social research officer, government: Close supervision Financial planner Method: Heritage manager: Radio broadcast assistant, Grab bars Vision Baseline Vision/History: Wears glasses;Cataracts Wears Glasses: Reading only Patient Visual Report: No change from baseline Vision Assessment?: No apparent visual deficits Perception  Perception: Within Functional Limits Praxis Praxis: Intact Cognition Overall Cognitive Status: History of cognitive impairments - at baseline(STM deficits) Orientation Level: Oriented X4 Sensation Sensation Hot/Cold: Appears Intact Proprioception: Appears Intact Stereognosis: Appears Intact Motor  Motor Motor - Discharge Observations: functional for performing basic self care tasks and transfers Mobility    close S with Rw to ambulate in and out of bathroom and complete transfers  Trunk/Postural Assessment  Postural Control Postural Limitations: relies on RW for UE support during standing, but can release 1 hand at a time  Balance Static Sitting Balance Static Sitting - Level of Assistance: 7: Independent Dynamic Sitting Balance Dynamic Sitting - Level of Assistance: 5: Stand by assistance Static Standing Balance Static Standing - Level of Assistance: 5: Stand by assistance Dynamic Standing Balance Dynamic Standing - Level of Assistance: 5: Stand by assistance Extremity/Trunk Assessment RUE Assessment RUE Assessment: Within Functional Limits LUE Assessment LUE Assessment: Within Functional Limits   SAGUIER,JULIA 08/08/2019, 3:14 PM

## 2019-08-08 NOTE — Progress Notes (Signed)
Team Conference Report to Patient/Family  Team Conference discussion was reviewed with the patient and message left for daughter, including goals, any changes in plan of care and target discharge date.  Patient expressed understanding and is in agreement.  The patient has a target discharge date of 08/10/19. Family education has been ongoing. [place] services set up with [facility] due to staffing issues with previously arranged agency and IV abx. [facility] will coordinate training of patient and family on IV Abx that will continue through 08/21/19. [facility] services confirmed for discharge. Patient has all DME.  Dorien Chihuahua B 08/08/2019, 10:15 AM

## 2019-08-08 NOTE — Discharge Summary (Signed)
Physician Discharge Summary  Patient ID: Brandi Stephens MRN: [id number] DOB/AGE: July 07, 1945 74 y.o.  Admit date: 08/02/2019 Discharge date: 08/10/2019  Discharge Diagnoses:  Principal Problem:   Lumbar radiculopathy Active Problems:   Status post lumbar spinal fusion   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HCC)   Chronic constipation   Benign essential HTN   Post-operative pain   Confusion, postoperative DVT prophylaxis Hypothyroidism Hyperlipidemia Restless leg syndrome History of breast cancer ductal carcinoma grade 1 Tremors CKD stage III  Discharged Condition: Stable  Significant Diagnostic Studies: DG Lumbar Spine 2-3 Views  Result Date: 07/24/2019 CLINICAL DATA:  Hardware revision EXAM: LUMBAR SPINE - 2-3 VIEW COMPARISON:  June 24, 2019 FINDINGS: The patient has undergone hardware revision. There appears to be extension of the hardware 2 the L1 vertebral body. The hardware appears grossly intact where visualized. The patient now appears to be status post posterior fusion from L1 through S1. Multilevel interbody spaces are noted. Expected postsurgical changes are noted. IMPRESSION: Lumbar hardware revision as above. Electronically Signed   By: Constance Holster M.D.   On: 07/24/2019 19:40   CT ASPIRATION  Result Date: 07/11/2019 CLINICAL DATA:  Low back pain post fusion surgery. Recent CT shows paraspinal lumbar collection. Aspiration requested EXAM: CT GUIDED NEEDLE ASPIRATE BIOPSY OF LUMBAR PARASPINAL COLLECTION ANESTHESIA/SEDATION: Intravenous Fentanyl 177mg and Versed 319mwere administered as conscious sedation during continuous monitoring of the patient's level of consciousness and physiological / cardiorespiratory status by the radiology RN, with a total moderate sedation time of 13 minutes. PROCEDURE: The procedure risks, benefits, and alternatives were explained to the patient. Questions regarding the procedure were encouraged and answered. The patient understands and consents to the  procedure. Patient placed prone. Select axial scans through the lumbar spine obtained. Using the previous contrast-enhanced scanned from 07/09/2019 for comparison, the left paraspinal collection at the L3-4 level was localized and an appropriate skin entry site was determined and marked. The operative field was prepped with chlorhexidinein a sterile fashion, and a sterile drape was applied covering the operative field. A sterile gown and sterile gloves were used for the procedure. Local anesthesia was provided with 1% Lidocaine. Under CT fluoroscopic guidance, 18 gauge trocar needle advanced into the collection. Less than 5 mL of viscous serosanguineous fluid were aspirated, sent for Gram stain and culture. Postprocedure scans show no hemorrhage or other apparent complication. The patient tolerated the procedure well. COMPLICATIONS: None immediate FINDINGS: The left lumbar paraspinal collection was localized under CT as above. A sample of the collection was obtained and sent for Gram stain and culture. IMPRESSION: 1. Technically successful CT-guided aspiration, lumbar paraspinal collection. Electronically Signed   By: D Lucrezia Europe.D.   On: 07/11/2019 16:22   DG C-Arm 1-60 Min  Result Date: 07/24/2019 CLINICAL DATA:  Surgery. EXAM: DG C-ARM 1-60 MIN FLUOROSCOPY TIME:  Fluoroscopy Time:  36 seconds Number of Acquired Spot Images: 2 COMPARISON:  05/30/2019 FINDINGS: The patient has undergone hardware revision. There appears to be extension of the hardware 2 the L1 vertebral body. The hardware appears grossly intact where visualized. The patient now appears to be status post posterior fusion from L1 through S1. Multilevel interbody spaces are noted. Expected postsurgical changes are noted. IMPRESSION: Status post revision of the patient's posterior fusion hardware as detailed above. Electronically Signed   By: ChConstance Holster.D.   On: 07/24/2019 20:09   [place] SITE RITE  Result Date: 07/12/2019 If Site Rite  image not attached, placement could not be confirmed due to  current cardiac rhythm.   Labs:  Basic Metabolic Panel: Recent Labs  Lab 08/02/19 2220 08/05/19 0422 08/09/19 0407  NA  --  141  --   K  --  3.6  --   CL  --  102  --   CO2  --  28  --   GLUCOSE  --  124*  --   BUN  --  9  --   CREATININE 0.75 0.70 0.73  CALCIUM  --  9.2  --     CBC: Recent Labs  Lab 08/02/19 2220 08/05/19 0422  WBC 10.3 8.1  NEUTROABS  --  4.9  HGB 9.5* 9.5*  HCT 31.8* 31.9*  MCV 94.6 95.5  PLT 300 331    CBG: No results for input(s): GLUCAP in the last 168 hours.  Family history.  Mother atrial fibrillation.  Maternal aunt with breast cancer.  Maternal uncle lung cancer.  Maternal grandmother stomach cancer.  Maternal aunt brain cancer.  Maternal grandfather prostate cancer.  Negative for colon cancer esophageal cancer or rectal cancer  Brief HPI:   Brandi Stephens is a 73 y.o. 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 Magrinat, COPD quit smoking 13 months ago tremors as well as restless leg syndrome constipation CKD stage III lumbar spinal stenosis with recent decompression and fusion 05/30/2019 per Dr. Nundkumar discharged home supervision level with rolling walker.  Patient lives with spouse two-level home.  Reports by family since recent back surgery limited ambulation secondary to mostly staying in bed requiring assistance for bathing and dressing.  Presented 06/25/2019 with increasing low back pain with radiation and weakness in the left leg.  Admission chemistries hemoglobin 9.7 WBC 12,700, BUN 70, creatinine 2.83, sodium 133, SARS coronavirus negative, ESR 136, lactic acid 0.9.  CT renal study negative for hydronephrosis or ureteral stone.  Blood cultures 1/4 blood cultures with GPC likely contaminant.  MRI lumbar spine showed postoperative changes from recent extension of posterior decompression and  fusion L2-3 level, persistent severe stenosis at the level of L2-3 with question circumferential epidural collection.  Approximate 5 x 4 cm x 7.4 cm heterogeneous collection within the posterior paraspinous soft tissue extending from L3-L5-S1.  Follow-up neurosurgery regards to latest back surgery no current plan for intervention at that time was started on Decadron.  Renal function improved with gentle IV fluids.  Her hospital course complicated by steroid-induced hyperglycemia.  Pain management initially maintained on MS Contin.  She was admitted to inpatient rehab services 06/28/2019 with slow progress gains ongoing issues with regards to pain management she was ambulating distances of 200 feet assistance multiple rest breaks ongoing complains of back pain followed by neurosurgery.  CT of abdomen completed due to bouts of constipation showed no bowel obstruction however there were concerns of interval posterior slippage of the disc space at L2-3 as well as small paraspinal abscess.  Films were reviewed by neurosurgery recommendations for interventional radiology for aspiration of joint L2-3 and infectious disease consulted for antibiotic care.  Due to patient's ongoing limitations physically as well as work-up for abscess she was transferred to acute care services 07/12/2019 started on IV vancomycin as well as cefepime per infectious disease.  Cultures grew MS coag negative staph and recommendations 6 weeks antibiotic care.  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 Kathyrn Sheriff.  Lumbar corset as advised maintained on Lovenox for DVT prophylaxis.  Patient again was admitted to  inpatient rehab services for comprehensive therapies   Hospital Course: Brandi Stephens was admitted to rehab 08/02/2019 for inpatient therapies to consist of PT, ST and OT at least three hours five days a week. Past admission physiatrist, therapy team and rehab RN have worked together to provide customized collaborative inpatient rehab.  Pertaining to patient's chronic back pain initially L2-3 fusion 1/61/0960 complicated by instrumentation failure postoperative lumbar infection removal of previous hardware placement of posterior segmental instrumentation L1-S1 with pedicle screws revision biomechanical cage 07/24/2019.  Back brace when out of bed.  She would follow-up neurosurgery.  Lovenox for DVT prophylaxis most recent vascular study negative.  Pain management ongoing much improved her MS Contin was discontinued 08/07/2019 she continued on Cymbalta as well as hydrocodone as needed.  Valium for muscle spasms if needed.  Mood stabilization her Seroquel was decreased to 12.5 mg she was attending full therapies.  She remained on intravenous antibiotic Ancef x6 weeks through 08/22/2019 followed by infectious disease she remained afebrile.  Acute on chronic anemia latest hemoglobin 9.5 and stable.  Blood pressures controlled with Toprol.  Bouts of constipation maintained with laxative assistance.  She will continue Synthroid for hypothyroidism.  She did have a history of breast cancer follow-up outpatient Dr. Jana Hakim.  History of restless leg syndrome continue Mysoline and Requip.  Lipitor ongoing for hyperlipidemia.   Blood pressures were monitored on TID basis and controlled  Brandi Stephens is continent of bowel and bladder.  Brandi Stephens has made gains during rehab stay and is attending therapies  Brandi Stephens will continue to receive follow up therapies   after discharge  Rehab course: During patient's stay in rehab weekly team conferences were held to monitor patient's progress, set goals and discuss barriers to discharge. At  admission, patient required minimal assist 130 feet rolling walker, minimal assist stand pivot transfers, minimal assist sit to supine moderate assist sit to side-lying.  Minimal assist upper body bathing max assist lower body bathing minimal assist upper body dressing max assist lower body dressing  Physical exam.  Blood pressure 157/74 pulse 90 temperature 98 respiration 15 oxygen saturations 92% room air Constitutional.  Well-developed HEENT Head.  Normocephalic and atraumatic Neck.  Supple nontender no JVD without thyromegaly Cardiac regular rate rhythm without any extra sounds or murmur heard Respiratory effort normal no respiratory distress without wheeze Abdomen.  Soft nontender positive bowel sounds without rebound Musculoskeletal +1 lower extremity edema Neurological alert somewhat anxious follow commands oriented x3 Motor.  Bilateral upper extremities 4/5 proximal distal Right lower extremity 4/5 proximal distal Left lower extremity 3 - 3+/5 proximal distal Sensation diminished to light touch left greater than right foot greatest in fourth and fifth digits Skin.  Back incision clean and dry  /She  has had improvement in activity tolerance, balance, postural control as well as ability to compensate for deficits. Brandi Stephens has had improvement in functional use RUE/LUE  and RLE/LLE as well as improvement in awareness.  Patient transferred to sitting edge of bed supervision therapist assisted to don teds and shoes.  Ambulates from bed to bathroom rolling walker supervision ambulates up to 180 feet rolling walker supervision some cues for rolling walker management.  She did receive rest breaks as needed.  Ambulates parallel  bars supervision.  Sidestepping 4 x 8 feet standing calf raises standing mini squats standing marches supervision.  Ambulates and out of bathroom again during sessions as needed to avoid.  Practice in a simulated manner with a 15 inch toilet for toileting needs.  Patient was  able to do this supervision.  She worked on sit to stand from edge of bed with rolling walker supervision.  She needs some assist for lower body ADLs.  Full family teaching completed plan discharge to home       Disposition: Discharge to home    Diet: Regular consistency  Special Instructions: No driving smoking or alcohol  Continue intravenous Ancef 2 g every 8 hours until 08/22/2019 per infectious disease  Medications at discharge. 1.  Tylenol as needed 2.  Zyloprim 300 mg p.o. daily 3.Armidex 1 mg p.o. daily 4.  Lipitor 20 mg p.o. daily 5.  Intravenous Ancef 2 g every 8 hours through 08/22/2019 and stop 6.  Valium 5 mg every 6 hours as needed muscle spasms 7.  Colace 100 mg p.o. 3 times daily hold for loose stools 8.  Cymbalta 60 mg p.o. daily 9.  Hydrocodone 1 tablet every 4 hours as needed moderate pain 10.  Synthroid 75 mcg p.o. daily 11.  Magnesium gluconate 1000 mg p.o. nightly 12.  Toprol-XL 25 mg p.o. daily 13.  MiraLAX twice daily hold for loose stools 14.  Mysoline 50 mg p.o. nightly 15.  Seroquel 12.5 mg p.o. nightly 16.  Requip 0.75 mg p.o. nightly 17.  Senokot S2 tabs p.o. nightly 18.  Vitamin B12 1000 mcg p.o. daily  Greater than 30 minutes were used in completing discharge summary and discharge planning  Discharge Instructions    [facility] pharmacist to adjust dose for Vancomycin, Aminoglycosides and other anti-infective therapies as requested by physician.   Complete by: As directed    [facility] to provide Cath Flo 2mg   Complete by: As directed    Administer for PICC line occlusion and as ordered by physician for other access device issues.   Ambulatory referral to Physical Medicine Rehab   Complete by: As directed    Moderate complexity follow-up 1 to 2 weeks lumbar myelopathy   Anaphylaxis Kit: Provided to treat any anaphylactic reaction to the medication being provided to the patient if First Dose or when requested by  physician   Complete by: As directed    Epinephrine 1mg/ml vial / amp: Administer 0.3mg (0.3ml) subcutaneously once for moderate to severe anaphylaxis, nurse to call physician and pharmacy when reaction occurs and call 911 if needed for immediate care   Diphenhydramine 50mg/ml IV vial: Administer 25-50mg IV/IM PRN for first dose reaction, rash, itching, mild reaction, nurse to call physician and pharmacy when reaction occurs   Sodium Chloride 0.9% NS 500ml IV: Administer if needed for hypovolemic blood pressure drop or as ordered by physician after call to physician with anaphylactic reaction   Change dressing on IV access line weekly and PRN   Complete by: As directed    Flush IV access with Sodium Chloride 0.9% and Heparin 10 units/ml or 100 units/ml   Complete by: As directed    Home infusion instructions - [facility]   Complete by: As directed    Instructions: Flush IV access with Sodium Chloride 0.9% and Heparin 10units/ml or 100units/ml   Change dressing on IV access line: Weekly and PRN   Instructions Cath Flo 2mg: Administer for PICC Line occlusion and as   ordered by physician for other access device   [facility] pharmacist to adjust dose for: Vancomycin, Aminoglycosides and other anti-infective therapies as requested by physician   Method of administration may be changed at the discretion of home infusion pharmacist based upon assessment of the patient and/or caregiver's ability to self-administer the medication ordered   Complete by: As directed    Outpatient Parenteral Antibiotic Therapy Information Antibiotic: Cefazolin (Ancef) IVPB; Indications for use: Osteomyelitis; End Date: 08/21/2019   Complete by: As directed    Antibiotic: Cefazolin (Ancef) IVPB   Indications for use: Osteomyelitis   End Date: 08/21/2019      Follow-up Information    Jamse Arn, MD Follow up.   Specialty: Physical Medicine and Rehabilitation Why: Office to call for  appointment Contact information: [place] [address] [phone]        [name], MD Follow up.   Specialty: Neurosurgery Why: Call for appointment Contact information: [address] [place] [address] [phone]        [name], MD Follow up.   Specialty: Infectious Diseases Why: Call for appointment Contact information: [address] [place] [address] [phone]        Magrinat, Virgie Dad, MD Follow up.   Specialty: Oncology Why: Call for appointment Contact information: [place] [address] [phone]           Signed: Cathlyn Parsons 08/09/2019, 5:25 AM

## 2019-08-08 NOTE — Plan of Care (Signed)
  Problem: Consults Goal: RH GENERAL PATIENT EDUCATION Description: See Patient Education module for education specifics. Outcome: Progressing   Problem: RH BOWEL ELIMINATION Goal: RH STG MANAGE BOWEL WITH ASSISTANCE Description: STG Manage Bowel with mod I Assistance. Outcome: Progressing Goal: RH STG MANAGE BOWEL W/MEDICATION W/ASSISTANCE Description: STG Manage Bowel with Medication with mod I Assistance. Outcome: Progressing   Problem: RH SKIN INTEGRITY Goal: RH STG SKIN FREE OF INFECTION/BREAKDOWN Description: Pt will be free of skin breakdown and infection prior to DC with supervision assist Outcome: Progressing   Problem: RH PAIN MANAGEMENT Goal: RH STG PAIN MANAGED AT OR BELOW PT'S PAIN GOAL Description: Less than 4 on 0-10 scale Outcome: Progressing

## 2019-08-08 NOTE — Progress Notes (Signed)
Brandi Stephens PHYSICAL MEDICINE & REHABILITATION PROGRESS NOTE   Subjective/Complaints: Patient seen laying in bed this AM.  She states she slept well overnight.  She states she fees better without her staples.  Answered patient and daughters questions regarding discharge meds.   ROS: Denies CP, SOB, N/V/D  Objective:   No results found. No results for input(s): WBC, HGB, HCT, PLT in the last 72 hours. No results for input(s): NA, K, CL, CO2, GLUCOSE, BUN, CREATININE, CALCIUM in the last 72 hours.  Intake/Output Summary (Last 24 hours) at 08/08/2019 1216 Last data filed at 08/07/2019 1839 Gross per 24 hour  Intake 320 ml  Output --  Net 320 ml     Physical Exam: Vital Signs Blood pressure (!) 169/102, pulse (!) 108, temperature 97.9 F (36.6 C), resp. rate 18, height 5\' 8"  (1.727 m), weight 104.1 kg, SpO2 (!) 85 %. Constitutional: No distress . Vital signs reviewed. HENT: Normocephalic.  Atraumatic. Eyes: EOMI. No discharge. Cardiovascular: No JVD. Respiratory: Normal effort.  No stridor. GI: Non-distended. Skin: Back incision C/D/I Psych: Normal mood.  Normal behavior. Musc: Lower extremity edema, unchanged Neurological: Alert Motor: Bilateral upper extremities: 4/5 proximal distal Right lower extremity: Hip flexion 3+/5, knee extension 4+/5, ankle dorsiflexion 4+/5, stable Left lower extremity: Hip flexion 3 -/5, knee extension 4+/5, ankle dorsiflexion 4+/5, stable  Assessment/Plan: 1. Functional deficits secondary to lumbar radiculopathy status post lumbar fusion which requires 3+ hours per day of interdisciplinary therapy in a comprehensive inpatient rehab setting.  Physiatrist is providing close team supervision and 24 hour management of active medical problems listed below.  Physiatrist and rehab team continue to assess barriers to discharge/monitor patient progress toward functional and medical goals  Care Tool:  Bathing    Body parts bathed by patient: Right  arm, Left arm, Chest, Abdomen, Left upper leg, Right upper leg, Face, Right lower leg, Left lower leg, Front perineal area   Body parts bathed by helper: Buttocks     Bathing assist Assist Level: Moderate Assistance - Patient 50 - 74%     Upper Body Dressing/Undressing Upper body dressing Upper body dressing/undressing activity did not occur (including orthotics): N/A What is the patient wearing?: Bra, Pull over shirt    Upper body assist Assist Level: Set up assist    Lower Body Dressing/Undressing Lower body dressing      What is the patient wearing?: Underwear/pull up, Pants     Lower body assist Assist for lower body dressing: Moderate Assistance - Patient 50 - 74%     Toileting Toileting    Toileting assist Assist for toileting: Minimal Assistance - Patient > 75% Assistive Device Comment: RW   Transfers Chair/bed transfer  Transfers assist     Chair/bed transfer assist level: Supervision/Verbal cueing Chair/bed transfer assistive device: Armrests, Programmer, multimedia   Ambulation assist      Assist level: Supervision/Verbal cueing Assistive device: Walker-rolling Max distance: 180 ft   Walk 10 feet activity   Assist     Assist level: Supervision/Verbal cueing Assistive device: Walker-rolling   Walk 50 feet activity   Assist    Assist level: Supervision/Verbal cueing Assistive device: Walker-rolling    Walk 150 feet activity   Assist    Assist level: Supervision/Verbal cueing Assistive device: Walker-rolling    Walk 10 feet on uneven surface  activity   Assist     Assist level: Minimal Assistance - Patient > 75% Assistive device: Aeronautical engineer  Will patient use wheelchair at discharge?: No             Wheelchair 50 feet with 2 turns activity    Assist            Wheelchair 150 feet activity     Assist          Blood pressure (!) 169/102, pulse (!) 108,  temperature 97.9 F (36.6 C), resp. rate 18, height 5\' 8"  (1.727 m), weight 104.1 kg, SpO2 (!) 85 %.  Medical Problem List and Plan: 1.  Decreased functional mobility secondary to acute on chronic back pain status post recent L2-3 fusion [id number] complicated by instrumentation failure with postoperative lumbar infection status post removal of previous hardware placement of posterior segmental instrumentation L1-S1 with pedicle screws L1, L2 revision biomechanical cage 07/24/2019.  Back brace when out of bed  Continue CIR 2.  Antithrombotics: -DVT/anticoagulation: Lovenox.  Most recent vascular study negative             -antiplatelet therapy: N/A 3. Pain Management:   MS Contin 15 mg every 12 hours d/ced on 4/21, tolerating  Cymbalta 60 mg daily, hydrocodone as needed, Valium as needed for muscle spasms  Controlled with meds on 4/22 with  Monitor with increased exertion 4. Mood: Provide emotional support             -antipsychotic agents: Seroquel 25 mg nightly decreased to 12.5 on 4/21 due to ?confusion, tolerating 5. Neuropsych: This patient is capable of making decisions on her own behalf. 6. Skin/Wound Care: Routine skin checks 7. Fluids/Electrolytes/Nutrition: Routine in and outs.   8.  ID/MS coags negative staph.  Continue IV antibiotic Ancef x6 weeks initiated 07/11/2019 9.  Acute on chronic anemia.    Hemoglobin 9.5 on 4/19  Continue to monitor 10.  Hypertension.  Toprol 12.5 mg daily.  Elevated this a.m., otherwise relatively controlled             Monitor with increased activity. 11.  Chronic constipation.  Continue to adjust bowel program  Bowel meds increased on 4/19  Improved 12.  Hypothyroidism.  Synthroid 13.  History of breast cancer.  Follow-up outpatient Dr. Jana Hakim.Continue Arimidex 14.  History of restless leg syndrome.  Mysoline 50 mg nightly, Requip 0.75 mg nightly 15.  Hyperlipidemia.  Lipitor 16.  Severe hypoalbuminemia  Continue protein supplement   LOS: 6  days A FACE TO FACE EVALUATION WAS PERFORMED  Brandi Stephens Lorie Phenix 08/08/2019, 12:16 PM

## 2019-08-08 NOTE — Progress Notes (Signed)
Physical Therapy Session Note  Patient Details  Name: Brandi Stephens MRN: [id number] Date of Birth: 01/28/1946  Today's Date: 08/08/2019 PT Individual Time: 1051-1200 PT Individual Time Calculation (min): 69 min   Short Term Goals: Week 1:  PT Short Term Goal 1 (Week 1): = to LTGs based on ELOS  Skilled Therapeutic Interventions/Progress Updates:     Pt received supine in bed and agreeable to therapy. Rates pain at 4/10 with soreness in low back. Rest breaks given as needed during session to manage pain symptoms.  Logrolling to the L and supine to sit with supervision and verbal cues for technique and sequencing to ensure pt maintaining back precautions. Sit to stand with RW and stand step transfer to RW with supervision for hand placement and body mechanics.   Pt ambulates 200' to therapy gym with RW and supervision with cues to increase LLE heel strike.  Pt performs ther-ex in // bars for BLE and core strengthening. Pt requires extended seated rest breaks between each exercise due to report of back pain. Sit to stand each time with supervision for safety and use of // bars. Verbal cues to engage core and maintain upright posture during all exercises. x10 BLE heel raises x10 Unilateral Heel Raises with each foot x7 minisquats (repititions limited by low back pain provocation) x9 BLE marches with high knees x10 BLE hamstring curls  Pt ambulates 80', 120' with seated rest break. Upon return to room pt impulsively leaves RW at foot of bed and "furniture walks" around bed to other side and quickly sits down. Pt educated on importance of keeping RW with her at all times and giving herself breaks as needed so that she does put herself in a position that reqiures unsafe maneuvers. Pt verbalizes understanding.  Sit>supine modified independent with use of bed rails and pt performs supine scoot to [facility] with minA to prevent feet from sliding.  Left supine in bed with all needs within  reach.    Therapy Documentation Precautions:  Precautions Precautions: Back, Fall Required Braces or Orthoses: Spinal Brace Spinal Brace: Lumbar corset, Applied in sitting position Restrictions Weight Bearing Restrictions: No    Therapy/Group: Individual Therapy  Breck Coons, PT, DPT 08/08/2019, 11:23 AM

## 2019-08-08 NOTE — Progress Notes (Signed)
Occupational Therapy Session Note  Patient Details  Name: Brandi Stephens MRN: [id number] Date of Birth: April 29, 1945  Today's Date: 08/08/2019 OT Individual Time: 1345-1445 OT Individual Time Calculation (min): 60 min    Short Term Goals: Week 1:  OT Short Term Goal 1 (Week 1): STGs = LTGs due to ELOS  Skilled Therapeutic Interventions/Progress Updates:    Pt seen this session for BADL training and family education with her daughter. Pt completed all self care including toileting, shower and dressing.  Using toilet tongs pt was able to cleanse self, long sponge washed feet, and dressing stick doffed pants independently and donned underwear independently but needed min A with pants.  She can don slippers independently but needs min A with mary jane shoes.   Pt and her daughter are ready for her to go home on Saturday and both are feeling much more comfortable with her mobility and progress. Discussed HHOT recommendations and the things she can work on at home.  Pt has made excellent progress.   Pt resting in bed with all needs met.    Therapy Documentation Precautions:  Precautions Precautions: Back, Fall Required Braces or Orthoses: Spinal Brace Spinal Brace: Lumbar corset, Applied in sitting position Restrictions Weight Bearing Restrictions: No  Therapy Vitals Temp: 98 F (36.7 C) Pulse Rate: 95 Resp: 19 BP: (!) 142/80 Patient Position (if appropriate): Lying Oxygen Therapy SpO2: 99 % O2 Device: Room Air Pain: Pain Assessment Pain Score: 3  ADL: ADL Eating: Independent Grooming: Independent Where Assessed-Grooming: Sitting at sink Upper Body Bathing: Supervision/safety Where Assessed-Upper Body Bathing: Shower Lower Body Bathing: Supervision/safety Where Assessed-Lower Body Bathing: Shower Upper Body Dressing: Supervision/safety Where Assessed-Upper Body Dressing: Edge of bed Lower Body Dressing: Minimal assistance Where Assessed-Lower Body Dressing: Edge of  bed Toileting: Supervision/safety Where Assessed-Toileting: Glass blower/designer: Close supervision Armed forces technical officer Method: Counselling psychologist: Grab bars, Raised toilet seat Tub/Shower Transfer: Not assessed Social research officer, government: Close supervision Social research officer, government Method: Heritage manager: Radio broadcast assistant, Grab bars   Therapy/Group: Individual Therapy  SAGUIER,JULIA 08/08/2019, 3:09 PM

## 2019-08-08 NOTE — Progress Notes (Signed)
Physical Therapy Session Note  Patient Details  Name: Brandi Stephens MRN: [id number] Date of Birth: 10/15/1945  Today's Date: 08/08/2019 PT Individual Time: 0901-1004 PT Individual Time Calculation (min): 63 min   Short Term Goals: Week 1:  PT Short Term Goal 1 (Week 1): = to LTGs based on ELOS Week 2:    Week 3:     Skilled Therapeutic Interventions/Progress Updates:    Pt initially supine and agreeable to treatment session with focus on endurance, gait.  Pt supine to sit w/use of bed features w/supervision.  Dons corset w/supervision.  STS from bed to RW w/cga.    Gait 130ft w/single rest break at 141ft by leaning against wall due to c/o being "short of breath". Pt w/decreased clearance LLE thru swing w/tendency to "scuff" floor w/mild tendency towards increasing trunk flexion and allowing walker to get anterior to center of mass.  Discussed safety implications of this and increased risk of ant balance loss.    Standing therex: requires rest in sitting between each effort  Sidestepping using walker x 5 each direction Marching x 5 w/cues for core activation Heel raises x 10   Gait 115ft as above back to room.  Turn/sit to bed w/cga and cues as above. Sit to supine w/supervision.  Removes brace in sidelying w/assist of therapist.  Side to supine mod I Therex: Pelvic tilts x 15 Transverse abd activation x 15 Transverse abd activation w/ single limb march, repeated w/alt leg  Pt left supine w/rails up x 3, alarm set, bed in lowest position, and needs in reach.   Therapy Documentation Precautions:  Precautions Precautions: Back, Fall Required Braces or Orthoses: Spinal Brace Spinal Brace: Lumbar corset, Applied in sitting position Restrictions Weight Bearing Restrictions: No    Therapy/Group: Individual Therapy  Callie Fielding, [place] 08/08/2019, 4:31 PM

## 2019-08-09 ENCOUNTER — Inpatient Hospital Stay (HOSPITAL_COMMUNITY): Payer: PPO | Admitting: Occupational Therapy

## 2019-08-09 ENCOUNTER — Inpatient Hospital Stay (HOSPITAL_COMMUNITY): Payer: PPO

## 2019-08-09 ENCOUNTER — Encounter (HOSPITAL_COMMUNITY): Payer: PPO | Admitting: Psychology

## 2019-08-09 LAB — CREATININE, SERUM
Creatinine, Ser: 0.73 mg/dL (ref 0.44–1.00)
GFR calc Af Amer: >60 mL/min (ref >60)
GFR calc non Af Amer: >60 mL/min (ref >60)

## 2019-08-09 MED ORDER — ATORVASTATIN CALCIUM 20 MG PO TABS: 20.0000 mg | ORAL_TABLET | Freq: Every day | ORAL | 1 refills | Status: DC

## 2019-08-09 MED ORDER — OXYCODONE HCL 5 MG PO TABS
5.0000 mg | ORAL_TABLET | ORAL | Status: DC | PRN
  Administered 2019-08-09 – 2019-08-10 (×3): 10 mg via ORAL
  Filled 2019-08-09 (×4): qty 2

## 2019-08-09 MED ORDER — ALLOPURINOL 300 MG PO TABS: ORAL_TABLET | ORAL | 0 refills | Status: DC

## 2019-08-09 MED ORDER — HYDROCODONE-ACETAMINOPHEN 10-325 MG PO TABS: 1.0000 | ORAL_TABLET | ORAL | 0 refills | Status: DC | PRN

## 2019-08-09 MED ORDER — QUETIAPINE FUMARATE 25 MG PO TABS: 12.5000 mg | ORAL_TABLET | Freq: Every day | ORAL | 0 refills | Status: DC

## 2019-08-09 MED ORDER — DIAZEPAM 5 MG PO TABS: 5.0000 mg | ORAL_TABLET | Freq: Four times a day (QID) | ORAL | 0 refills | Status: DC | PRN

## 2019-08-09 MED ORDER — ALBUTEROL SULFATE HFA 108 (90 BASE) MCG/ACT IN AERS: INHALATION_SPRAY | RESPIRATORY_TRACT | 0 refills | Status: DC

## 2019-08-09 MED ORDER — DULOXETINE HCL 60 MG PO CPEP: ORAL_CAPSULE | ORAL | 2 refills | Status: DC

## 2019-08-09 MED ORDER — VITAMIN B-12 1000 MCG PO TABS: 1000.0000 ug | ORAL_TABLET | Freq: Every day | ORAL | 0 refills | Status: DC

## 2019-08-09 MED ORDER — METOPROLOL SUCCINATE ER 25 MG PO TB24: 25.0000 mg | ORAL_TABLET | Freq: Every day | ORAL | 0 refills | Status: DC

## 2019-08-09 MED ORDER — MAGNESIUM GLUCONATE 500 MG PO TABS: 1000.0000 mg | ORAL_TABLET | Freq: Every day | ORAL | 0 refills | Status: DC

## 2019-08-09 MED ORDER — PRIMIDONE 50 MG PO TABS: ORAL_TABLET | ORAL | 1 refills | Status: DC

## 2019-08-09 MED ORDER — SENNOSIDES-DOCUSATE SODIUM 8.6-50 MG PO TABS: 2.0000 | ORAL_TABLET | Freq: Every day | ORAL | Status: DC

## 2019-08-09 MED ORDER — ROPINIROLE HCL 0.25 MG PO TABS: 0.7500 mg | ORAL_TABLET | Freq: Every day | ORAL | 0 refills | Status: DC

## 2019-08-09 NOTE — Progress Notes (Signed)
Occupational Therapy Session Note  Patient Details  Name: Brandi Stephens MRN: [id number] Date of Birth: 03-14-1946  Today's Date: 08/09/2019 OT Individual Time: 1100-1159 OT Individual Time Calculation (min): 59 min   Short Term Goals: Week 1:  OT Short Term Goal 1 (Week 1): STGs = LTGs due to ELOS  Skilled Therapeutic Interventions/Progress Updates:    Pt greeted in bed with RN present administering pain medicine. Pts dtr, Brandi Stephens present and asking to review a few ADL related activities in prep for d/c home tomorrow. Started with educating Brandi Stephens on adaptive technique for donning pts Teds stockings. Brandi Stephens able to demonstrate carryover of understanding when donning 1 stocking after OT. Pt able to complete supine<sit from elevated bed unassisted today. Per Brandi Stephens, their bed at home is adjustable and also has a bedrail. Brandi Stephens provided close supervision assistance while pt ambulated with RW into bathroom. We completed simulated toileting tasks after BM with pt reporting increased pain when positioning the toilet tongs. Discussed with Brandi Stephens that pt will realistically need Min A at times for toileting at home. Encouraged practicing toilet tong use with f/u OT to improve functional use. Transitioned to TTB transfers in the ADL apartment. Discussed with both Brandi Stephens and pt proper transfer technique with demonstration. Pt then completed this transfer with OT using RW for support. She needed Min A to manage the Lt LE and also vcs. Discussed with Brandi Stephens showering at night to conserve energy and also showering less frequently during the week as this is a very energy intensive ADL activity for pt. Both pt and Brandi Stephens agreeable to education. We also discussed a few alternative pain mgt interventions to discuss with MD, such as TENS and inversion table. Pt was then returned to room via w/c, initially agreeable to sit up, but after a few minutes with ice applied to lower back, pt with increased nausea and  requested to lie back down. OT left her in care of RN for receiving antinausea medicine and being assisted back to bed. OT also provided her with an antinausea aromatherapy blend.    Therapy Documentation Precautions:  Precautions Precautions: Back, Fall Required Braces or Orthoses: Spinal Brace Spinal Brace: Lumbar corset, Applied in sitting position Restrictions Weight Bearing Restrictions: No Vital Signs: Therapy Vitals Temp: (!) 97.4 F (36.3 C) Pulse Rate: (!) 109 Resp: 20 BP: (!) 149/91 Patient Position (if appropriate): Sitting Oxygen Therapy SpO2: 96 % O2 Device: Room Air Pain: Pain Assessment Pain Scale: 0-10 Pain Score: 7  Pain Type: Acute pain Pain Location: Back Pain Intervention(s): Medication (See eMAR) ADL: ADL Eating: Independent Grooming: Independent Where Assessed-Grooming: Sitting at sink Upper Body Bathing: Supervision/safety Where Assessed-Upper Body Bathing: Shower Lower Body Bathing: Minimal assistance Where Assessed-Lower Body Bathing: Shower Upper Body Dressing: Supervision/safety Where Assessed-Upper Body Dressing: Edge of bed Lower Body Dressing: Minimal assistance Where Assessed-Lower Body Dressing: Edge of bed Toileting: Supervision/safety, Minimal assistance Where Assessed-Toileting: Glass blower/designer: Close supervision Armed forces technical officer Method: Counselling psychologist: Grab bars, Raised toilet seat Tub/Shower Transfer: Not assessed Social research officer, government: Close supervision Social research officer, government Method: Heritage manager: Radio broadcast assistant, Grab bars      Therapy/Group: Individual Therapy  Brandi Stephens A Brandi Stephens 08/09/2019, 4:13 PM

## 2019-08-09 NOTE — Progress Notes (Signed)
Occupational Therapy Session Note  Patient Details  Name: KISMET FACEMIRE MRN: [id number] Date of Birth: 15-Jun-1945  Today's Date: 08/09/2019 OT Individual Time: 1355-1423 OT Individual Time Calculation (min): 28 min    Short Term Goals: Week 1:  OT Short Term Goal 1 (Week 1): STGs = LTGs due to ELOS   Skilled Therapeutic Interventions/Progress Updates:  Patient met lying supine in bed in agreement with OT treatment session with focus on functional transfers and self-care re-education as detailed below. Supine to EOB with supervision A and use of bed features. Patient able to don shoes seated EOB with use of dressing stick and Min A. Sit to stand with supervision A, functional mobility to bathroom with use of RW and toilet transfer with supervision A. Patient requires Min A for hygiene to adhere to spinal precautions. Session concluded with patient seated in recliner with all needs met.   Therapy Documentation Precautions:  Precautions Precautions: Back, Fall Required Braces or Orthoses: Spinal Brace Spinal Brace: Lumbar corset, Applied in sitting position Restrictions Weight Bearing Restrictions: No  Therapy/Group: Individual Therapy  Darrell Leonhardt R [place] 08/09/2019, 2:06 PM

## 2019-08-09 NOTE — Discharge Instructions (Signed)
Inpatient Rehab Discharge Instructions  Brandi Stephens Discharge date and time: No discharge date for patient encounter.   Activities/Precautions/ Functional Status: Activity: Back brace as indicated Diet: regular diet Wound Care: keep wound clean and dry Functional status:  ___ No restrictions     ___ Walk up steps independently ___ 24/7 supervision/assistance   ___ Walk up steps with assistance ___ Intermittent supervision/assistance  ___ Bathe/dress independently ___ Walk with walker     _x__ Bathe/dress with assistance ___ Walk Independently    ___ Shower independently ___ Walk with assistance    ___ Shower with assistance ___ No alcohol     ___ Return to work/school ________  COMMUNITY REFERRALS UPON DISCHARGE:  Home Health: PT, OT, [place] Phone:[phone]  [facility] will manage IV Abx with [facility] RN  Special Instructions: No driving smoking or alcohol  Intravenous Ancef 2 g every 8 hours until 08/22/2019 per infectious disease Dr. [name]   My questions have been answered and I understand these instructions. I will adhere to these goals and the provided educational materials after my discharge from the hospital.  Patient/Caregiver Signature _______________________________ Date __________  Clinician Signature _______________________________________ Date __________  Please bring this form and your medication list with you to all your follow-up doctor's appointments.

## 2019-08-09 NOTE — Care Management (Signed)
   The overall goal for the admission was met for:   Discharge location: Home with spouse/daughter to assist  Length of Stay: 8 days with discharge 08/10/19  Discharge activity level: Patient to discharge at an ambulatory level Supervision.  Home/community participation: Clinical research associate provided included: MD, RD, PT, OT, SLP, RN, CM, TR, Pharmacy, Neuropsych and SW  Financial Services: Private Insurance: [employer]  Follow-up services arranged: Home Health: PT, OT, RN, Other: [facility] for IV Abx and Patient/Family has no preference for HH/DME agencies  Comments (or additional information): [facility] [phone]  [facility] for IV Abx through 08/21/19 [phone]  Patient/Family verbalized understanding of follow-up arrangements: Yes   Family Education Completed  Individual responsible for coordination of the follow-up plan: Daughter: Brandi Stephens [phone]  Confirmed no DME recommended: Margarito Liner 08/09/2019    Margarito Liner

## 2019-08-09 NOTE — Consult Note (Signed)
Neuropsychological Consultation   Patient:   Brandi Stephens   DOB:   1946-02-17  MR Number:  [id number]  Location:  [facility] [id number] [place] [address] Dept: [place]: [phone]           Date of Service:   08/09/2019  Start Time:   8 AM End Time:   9 AM  Provider/Observer:  Ilean Skill, Psy.D.       Clinical Neuropsychologist       Billing Code/Service: [phone]  Chief Complaint:    Brandi Stephens is a 74 year old female with history of hypertension, breast cancer, hypothyroidism, COPD, tremors as well as restless leg syndrome, chronic constipation, chronic kidney disease stage III, lumbar spinal stenosis post recent decompression and fusion 05/30/2019.  Patient presented on 06/25/2019 with increasing low back pain with radiation and weakness in left leg.  MRI lumbar spine showed postoperative changes from recent extension of posterior decompression and fusion L2-3, persistent severe stenosis at level of L2-3 with question circumferential epidural collection.  At the time there was not a plan for intervention.  Renal functions improved with gentle IV fluids.  Hospital course was complicated with steroid-induced hyperglycemia.  Pain management initially maintained with MS Contin.  The patient was admitted into the inpatient rehabilitation services on 06/28/2019 with slow progressive gains.  After CT of the abdomen was conducted there were concerns about slippage of disc space L2-3 as well as a paraspinal abscess L2-3.  Neurosurgery recommended interventional radiology for aspiration of joint L2-3 and infectious disease was consulted for antibiotic care.  Further interventions were done by neurosurgery and the patient was then brought back to the comprehensive rehabilitation unit for further rehab.  The patient has been making some significant functional gains through therapy and is  planned for discharge tomorrow.  There have been some concerns about changes in cognitive functioning and mental status and at times she had acute changes postoperatively and the patient and her daughter report more difficulties with memory and recall.  The patient does have a significant prior history of anxiety and depression and this is clearly been a significant stressor for the patient exacerbating some of her worry and anxiety.   Reason for Service:  The patient was referred for neuropsychological consultation due to concerns about cognitive functioning and memory as well as adjustment and anxiety associated with upcoming discharge.  Below is the HPI for the current mission.  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 Jana Hakim, hypothyroidism, COPD and quit smoking approximately 13 months ago, tremors as well as restless leg syndrome, chronic constipation, chronic kidney disease stage III, lumbar spinal stenosis post recent decompression and fusion 05/30/2019 per Dr. Kathyrn Sheriff discharge to home supervision level with rolling walker.  History taken from chart review and patient.  Patient lives with spouse.  Two-level home 2 steps to entry.  Reports by family since recent back surgery was limited ambulation secondary to mostly staying in bed requiring assistance for bathing and dressing.  Patient presented 06/25/2019 with increasing low back pain with radiation and weakness in the left leg.  Admission chemistries hemoglobin 9.7, WBC 12,700, BUN 70, creatinine 2.83, sodium 133, SARS coronavirus negative, ESR 136, lactic acid 0.9.  CT renal study negative for hydronephrosis or ureteral stone.  Blood cultures 1/4 blood cultures with GPC likely  contaminant.  MRI lumbar spine showed postoperative changes from recent extension of posterior decompression and fusion L2-3 level,  persistent severe stenosis at the level of L2-3 with question circumferential epidural collection.  Approximate 5 x 4 cm x 7.4 cm heterogeneous collection within the posterior paraspinous soft tissue extending from L3-L5-S1.  Follow-up neurosurgery Dr. Kathyrn Sheriff regards to latest back surgery no current plan for intervention at that time she was started on Decadron.  Renal function improved with gentle IV fluids.  Her hospital course was complicated by steroid-induced hyperglycemia.  Pain management initially maintained with MS Contin.  She was admitted to inpatient rehab services 06/28/2019 with slow progressive gains ongoing issues with regards to pain management.  She was ambulating distances of 200 feet assistive device needing multiple rest breaks ongoing complaints of back pain with followed by neurosurgery.  CT of abdomen completed due to bouts of constipation showed no bowel obstruction however there was concerns of interval posterior slippage of the disc space at L2-3 as well as small paraspinal abscess L2-3.  Films were reviewed by neurosurgery recommendations for interventional radiology for aspiration of joint L2-3 and infectious disease consulted for antibiotic care.  Due to patient's ongoing limitations physically as well as work-up for abscess she was transferred to acute care services 07/12/2018 patient had been started on IV vancomycin as well as cefepime per infectious disease.  Cultures grew MS coag negative staph and recommendations were for 6 weeks of antibiotic care with cefazolin x6 weeks and then consider transitioning to oral antibiotics.  Patient with ongoing back pain suspect lumbar osteo/discitis and hardware failure patient underwent removal of previous hardware including L2 pedicle screw and previous rod placement of posterior segmental instrumentation L1-S1 including standard pedicle screws at L1, L2, and placement of new rod and offset connector revision of previous interbody  biomechanical cage 07/24/2019 per Dr. Kathyrn Sheriff.  Lumbar corset maintained applied in sitting position.  Maintained on Lovenox for DVT prophylaxis.  Pain control ongoing with MS Contin 15 mg every 12 hours as well as oxycodone for breakthrough pain.  Therapy evaluations have been completed postoperatively and patient is admitted for a comprehensive rehab program.  Please see preadmission assessment from earlier today as well.  Current Status:  The patient was generally well oriented with good mental status although did describe difficulties with some retrieval of information and memory issues which was confirmed by the patient's daughter.  Anxiety associated with concern over medical status and functional abilities, upcoming discharge home and worry about responsibilities that her family will have to help take care of her and concerns about how the acute cognitive changes may relate to possible long-term issues overall discussed.  Expressive and receptive language appeared to be intact and the patient was able to maintain attention and concentration to an adequate level to follow discussion and have spirited interaction during the interview and was able to recall information of being discussed prior in the discussion at a later time during the discussion.  Patient acknowledges continued but improving pain symptoms and improving physical strength but continued sleep disturbance.  Patient reports that sleep disturbance has been a chronic issue that was present well before the current hospitalization.  Behavioral Observation: Brandi Stephens  presents as a 11 y.o.-year-old Right Caucasian Female who appeared her stated age. her dress was Appropriate and she was Well Groomed and her manners were Appropriate to the situation.  her participation was indicative of Appropriate and Redirectable behaviors.  There were any physical disabilities noted.  she displayed an appropriate level of cooperation and motivation.      Interactions:    Active Appropriate and Redirectable  Attention:   abnormal and the patient's attention span was somewhat below expected normative levels however most of the distraction had to do with internal preoccupations and internal focus/anxiety.  Memory:   abnormal; remote memory intact, recent memory impaired  Visuo-spatial:  not examined  Speech (Volume):  normal  Speech:   normal; normal  Thought Process:  Coherent and Relevant  Though Content:  WNL; not suicidal and not homicidal  Orientation:   person, place, time/date and situation  Judgment:   Fair  Planning:   Fair  Affect:    Anxious  Mood:    Anxious  Insight:   Good  Intelligence:   normal  Medical History:   Past Medical History:  Diagnosis Date  . AIN (anal intraepithelial neoplasia) anal canal   . Anemia    with pregnancy  . Anxiety   . Arthritis    knees, lower back, knees  . Chronic constipation   . Chronic diastolic CHF (congestive heart failure) ([place]) 07/05/2019  . CKD (chronic kidney disease)   . Coarse tremors    Essential  . COPD (chronic obstructive pulmonary disease) ([facility])    2017 chest CT, history of  . Degenerative lumbar spinal stenosis   . Depression   . Depression 07/05/2019  . Drug dependency ([place])   . Elevated PTHrP level 05/09/2019  . ETOH abuse 07/05/2019  . GERD (gastroesophageal reflux disease)    pt denies  . Gout   . Grade I diastolic dysfunction 73/22/0254   Noted ECHO  . Hemorrhoids   . History of adenomatous polyp of colon   . History of basal cell carcinoma (BCC) excision 2015   nose  . History of sepsis 05/14/2014   post lumbar surgery  . History of shingles    x2  . Hyperlipidemia   . Hypertension   . Hypothyroidism   . Irregular heart beat    history of while undergoing radiation  . Malignant neoplasm of upper-inner quadrant of left breast in female, estrogen receptor positive [facility]) oncologist-  dr Jana Hakim--  per lov in epic no recurrence   dx  07-13-2015  Left breast invasive ductal carcinoma, Stage IA, Grade 1 (TXN0),  09-16-2015  s/p  left breast lumpectomy with sln bx's,  completed radiation 11-18-2015,  started antiestrogen therapy 12-14-2015  . Neuropathy    Left foot and back of left leg  . Obesity (BMI 30-39.9) 07/05/2019  . Personal history of radiation therapy    completed 11-18-2015  left breast  . Pneumonia   . PONV (postoperative nausea and vomiting)   . Prediabetes    diet controlled, no med  . Restless leg syndrome   . Seasonal allergies   . Seizure ([place]) 05/2015   due to sepsis, only one time episode  . Tremor   . Wears partial dentures    lower    Psychiatric History:  Patient does have a prior psychiatric history related to anxiety and depression and the anxiety has been exacerbated more recently.  Family Med/Psych History:  Family History  Problem Relation Age of Onset  . Atrial fibrillation Mother   . Ulcers Father   . Breast cancer Maternal Aunt   . Lung cancer Maternal Uncle   . Stomach cancer Maternal Grandmother   . Lung cancer Maternal Aunt   . Brain cancer Maternal Aunt   . Prostate cancer  Maternal Grandfather   . Stroke Paternal Grandmother   . Tuberculosis Paternal Grandfather   . Colon cancer Neg Hx   . Esophageal cancer Neg Hx   . Rectal cancer Neg Hx      Impression/DX:  Brandi Stephens is a 74 year old female with history of hypertension, breast cancer, hypothyroidism, COPD, tremors as well as restless leg syndrome, chronic constipation, chronic kidney disease stage III, lumbar spinal stenosis post recent decompression and fusion 05/30/2019.  Patient presented on 06/25/2019 with increasing low back pain with radiation and weakness in left leg.  MRI lumbar spine showed postoperative changes from recent extension of posterior decompression and fusion L2-3, persistent severe stenosis at level of L2-3 with question circumferential epidural collection.  At the time there was not a plan for  intervention.  Renal functions improved with gentle IV fluids.  Hospital course was complicated with steroid-induced hyperglycemia.  Pain management initially maintained with MS Contin.  The patient was admitted into the inpatient rehabilitation services on 06/28/2019 with slow progressive gains.  After CT of the abdomen was conducted there were concerns about slippage of disc space L2-3 as well as a paraspinal abscess L2-3.  Neurosurgery recommended interventional radiology for aspiration of joint L2-3 and infectious disease was consulted for antibiotic care.  Further interventions were done by neurosurgery and the patient was then brought back to the comprehensive rehabilitation unit for further rehab.  The patient has been making some significant functional gains through therapy and is planned for discharge tomorrow.  There have been some concerns about changes in cognitive functioning and mental status and at times she had acute changes postoperatively and the patient and her daughter report more difficulties with memory and recall.  The patient does have a significant prior history of anxiety and depression and this is clearly been a significant stressor for the patient exacerbating some of her worry and anxiety.  The patient was generally well oriented with good mental status although did describe difficulties with some retrieval of information and memory issues which was confirmed by the patient's daughter.  Anxiety associated with concern over medical status and functional abilities, upcoming discharge home and worry about responsibilities that her family will have to help take care of her and concerns about how the acute cognitive changes may relate to possible long-term issues overall discussed.  Expressive and receptive language appeared to be intact and the patient was able to maintain attention and concentration to an adequate level to follow discussion and have spirited interaction during the  interview and was able to recall information of being discussed prior in the discussion at a later time during the discussion.  Patient acknowledges continued but improving pain symptoms and improving physical strength but continued sleep disturbance.  Patient reports that sleep disturbance has been a chronic issue that was present well before the current hospitalization.  Disposition/Plan:  Today we worked on review of memory status and coping and adjustment issues particular anxiety associated with upcoming discharge and expectations going forward.  While we will not automatically set up a referral for outpatient neuropsych eval the patient and her daughter were given instructions that if these cognitive and memory difficulties persisted once she became more mobile and pain medicines are reduced and infection resolved but they should let Dr. Posey Pronto know during her follow-up visits on the outpatient unit and we will set up for formal testing.  However, I suspect that the patient will improve once her medical status improves and stabilizes.  Electronically Signed   _______________________ Ilean Skill, Psy.D.

## 2019-08-09 NOTE — Progress Notes (Signed)
[facility] PHYSICAL MEDICINE & REHABILITATION PROGRESS NOTE   Subjective/Complaints: Patient seen laying in bed this morning.  Daughter at bedside.  She states she slept well overnight.  Daughter with questions regarding Covid vaccine.  ROS: Denies CP, SOB, N/V/D  Objective:   No results found. No results for input(s): WBC, HGB, HCT, PLT in the last 72 hours. Recent Labs    08/09/19 0407  CREATININE 0.73    Intake/Output Summary (Last 24 hours) at 08/09/2019 1055 Last data filed at 08/09/2019 0900 Gross per 24 hour  Intake 607 ml  Output --  Net 607 ml     Physical Exam: Vital Signs Blood pressure 137/76, pulse 80, temperature 97.6 F (36.4 C), temperature source Oral, resp. rate 19, height 5\' 8"  (1.727 m), weight 108.6 kg, SpO2 90 %. Constitutional: No distress . Vital signs reviewed. HENT: Normocephalic.  Atraumatic. Eyes: EOMI. No discharge. Cardiovascular: No JVD. Respiratory: Normal effort.  No stridor. GI: Non-distended. Skin: Back incision C/D/I Psych: Normal mood.  Normal behavior. Musc: Lower extremity edema improving Neurological: Alert Motor: Bilateral upper extremities: 4/5 proximal distal Right lower extremity: Hip flexion 4/5, knee extension 4+/5, ankle dorsiflexion 5/5 Left lower extremity: Hip flexion 3/5, knee extension 4+/5, ankle dorsiflexion 5/5  Assessment/Plan: 1. Functional deficits secondary to lumbar radiculopathy status post lumbar fusion which requires 3+ hours per day of interdisciplinary therapy in a comprehensive inpatient rehab setting.  Physiatrist is providing close team supervision and 24 hour management of active medical problems listed below.  Physiatrist and rehab team continue to assess barriers to discharge/monitor patient progress toward functional and medical goals  Care Tool:  Bathing    Body parts bathed by patient: Right arm, Left arm, Chest, Abdomen, Left upper leg, Right upper leg, Face, Right lower leg, Left lower  leg, Front perineal area   Body parts bathed by helper: Buttocks     Bathing assist Assist Level: Moderate Assistance - Patient 50 - 74%     Upper Body Dressing/Undressing Upper body dressing Upper body dressing/undressing activity did not occur (including orthotics): N/A What is the patient wearing?: Bra, Pull over shirt    Upper body assist Assist Level: Set up assist    Lower Body Dressing/Undressing Lower body dressing      What is the patient wearing?: Underwear/pull up, Pants     Lower body assist Assist for lower body dressing: Moderate Assistance - Patient 50 - 74%     Toileting Toileting    Toileting assist Assist for toileting: Minimal Assistance - Patient > 75% Assistive Device Comment: RW   Transfers Chair/bed transfer  Transfers assist     Chair/bed transfer assist level: Supervision/Verbal cueing Chair/bed transfer assistive device: Armrests, Programmer, multimedia   Ambulation assist      Assist level: Supervision/Verbal cueing Assistive device: Walker-rolling Max distance: 150   Walk 10 feet activity   Assist     Assist level: Contact Guard/Touching assist Assistive device: Walker-rolling   Walk 50 feet activity   Assist    Assist level: Contact Guard/Touching assist Assistive device: Walker-rolling    Walk 150 feet activity   Assist    Assist level: Contact Guard/Touching assist Assistive device: Walker-rolling    Walk 10 feet on uneven surface  activity   Assist     Assist level: Minimal Assistance - Patient > 75% Assistive device: Aeronautical engineer Will patient use wheelchair at discharge?: No  Wheelchair 50 feet with 2 turns activity    Assist            Wheelchair 150 feet activity     Assist          Blood pressure 137/76, pulse 80, temperature 97.6 F (36.4 C), temperature source Oral, resp. rate 19, height 5\' 8"  (1.727 m), weight  108.6 kg, SpO2 90 %.  Medical Problem List and Plan: 1.  Decreased functional mobility secondary to acute on chronic back pain status post recent L2-3 fusion [id number] complicated by instrumentation failure with postoperative lumbar infection status post removal of previous hardware placement of posterior segmental instrumentation L1-S1 with pedicle screws L1, L2 revision biomechanical cage 07/24/2019.  Back brace when out of bed  Continue CIR  Plan for DC tomorrow  Follow-up for transitional care management in 1 to 2 weeks post discharge. 2.  Antithrombotics: -DVT/anticoagulation: Lovenox.  Most recent vascular study negative  Creatinine within normal notes on 4/23             -antiplatelet therapy: N/A 3. Pain Management:   MS Contin 15 mg every 12 hours d/ced on 4/21, tolerating  Cymbalta 60 mg daily, hydrocodone as needed, Valium as needed for muscle spasms  Controlled with meds on 4/23  Monitor with increased exertion 4. Mood: Provide emotional support             -antipsychotic agents: Seroquel 25 mg nightly decreased to 12.5 on 4/21 due to ?confusion, tolerating, will consider further wean as outpatient 5. Neuropsych: This patient is capable of making decisions on her own behalf.  Discussed with Neuropsych - multifactorial confusion, appreciate rescs 6. Skin/Wound Care: Routine skin checks 7. Fluids/Electrolytes/Nutrition: Routine in and outs.   8.  ID/MS coags negative staph.  Continue IV antibiotic Ancef x6 weeks initiated 07/11/2019 9.  Acute on chronic anemia.    Hemoglobin 9.5 on 4/19  Continue to monitor 10.  Hypertension.  Toprol 12.5 mg daily.  Controlled on 4/23             Monitor with increased activity. 11.  Chronic constipation.  Continue to adjust bowel program  Bowel meds increased on 4/19  Improved 12.  Hypothyroidism.  Synthroid 13.  History of breast cancer.  Follow-up outpatient Dr. Jana Hakim.Continue Arimidex 14.  History of restless leg syndrome.  Mysoline 50  mg nightly, Requip 0.75 mg nightly 15.  Hyperlipidemia.  Lipitor 16.  Severe hypoalbuminemia  Continue protein supplement   LOS: 7 days A FACE TO FACE EVALUATION WAS PERFORMED  Jayziah Bankhead Lorie Phenix 08/09/2019, 10:55 AM

## 2019-08-09 NOTE — Plan of Care (Signed)
  Problem: RH Balance Goal: LTG Patient will maintain dynamic standing with ADLs (OT) Description: LTG:  Patient will maintain dynamic standing balance with assist during activities of daily living (OT)  Outcome: Completed/Met   Problem: Sit to Stand Goal: LTG:  Patient will perform sit to stand in prep for activites of daily living with assistance level (OT) Description: LTG:  Patient will perform sit to stand in prep for activites of daily living with assistance level (OT) Outcome: Completed/Met

## 2019-08-09 NOTE — Progress Notes (Signed)
Physical Therapy Discharge Summary  Patient Details  Name: Brandi Stephens MRN: [id number] Date of Birth: Oct 09, 1945  Today's Date: 08/09/2019 PT Individual Time: [phone] and [id number] PT Individual Time Calculation (min): 58 min and 57 min PT missed Time: 18 min  Patient has met 9 of 9 long term goals due to improved activity tolerance, improved balance, improved postural control, increased strength and decreased pain.  Patient to discharge at an ambulatory level Supervision.   [name] is her daughter and is independent to provide the necessary physical assistance at discharge.  Reasons goals not met: NA  Recommendation:  Patient will benefit from ongoing skilled PT services in home health setting to continue to advance safe functional mobility, address ongoing impairments in strength, balance, ambulation, endurance, and minimize fall risk.  Equipment: No equipment provided  Reasons for discharge: treatment goals met and discharge from hospital  Patient/family agrees with progress made and goals achieved: Yes   Skilled Therapeutic Interventions: 1st Session: Pt received supine in bed and agreeable to therapy. Pt performs bed mobility, performing logrolling to R and sidelying to sit independently with use of bed features. Sitting EOB pt dons LSO with supervision assist for verbal cues on placement. Pt performs sit to stand, ambulation to toilet 20' with supervision and RW. Daughter assists with toileting and pericare and pt ambulates to sink to wash hands. WC transport to therapy gym for energy conservation.  Pt performs car transfer with supervision following PT demonstration. Ambulates up/down ramp with supervision for safety and decreased gait speed and stride lengths.   Following stair training pt ambulates 57' with RW and supervision. Verbal cues to increase proximity to RW for safety, maintain upright gaze and posture to improve balance and decrease risk for  falls. Sit to supine modified independent with use of bed features. Pt left supine in bed with all needs within reach.  2nd Session: Pt received seated in recliner with brace donned and agreeable to therapy. Stand step transfer to [facility] modified independent with RW. WC transport to therapy gym for energy conservation. Pt performs x12 steps with BHR and supervision with PT cues for correct technique.Extended seated rest break following stairs to manage back pain symptoms. Pt rating back pain at 8/10.   WC transport outside for overgound ambulation on unlevel surfaces. Ambulates 115' x2 over concrete with cracks and slight elevation changes. WC transport back to room and pt performs picking object off ground with RW and reacher and supervision from PT for technique. Sit to supine mod(I) and pt left supine in bed with alarm intact and all needs within reach.  RN in room to give pain meds and pt misses final 18 mins of session secondary to pain management.   PT Discharge Precautions/Restrictions Precautions Precautions: Back;Fall Spinal Brace: Lumbar corset;Applied in sitting position Restrictions Weight Bearing Restrictions: No Pain Pain Assessment Pain Scale: 0-10 Pain Score: 5  Pain Type: Acute pain Pain Location: Back Pain Intervention(s): Medication (See eMAR) Vision/Perception  Perception Perception: Within Functional Limits Praxis Praxis: Intact  Cognition Overall Cognitive Status: Within Functional Limits for tasks assessed Arousal/Alertness: Awake/alert Orientation Level: Oriented X4 Safety/Judgment: Appears intact Sensation Sensation Light Touch: Impaired Detail Peripheral sensation comments: though intact on light touch screen pt reports deminished L LE sensation compared to R. Numbness in L 4th-5th toes. Light Touch Impaired Details: Impaired LLE Coordination Gross Motor Movements are Fluid and Coordinated: No Fine Motor Movements are Fluid and Coordinated:  No Coordination and Movement Description: Pt exhibits B UE  tremors - impaired gross motor movements due to generalized weakness (L LE > R LE) and impaired balance with increased reliance on RW Motor  Motor Motor: Other (comment) Motor - Skilled Clinical Observations: Generalized weakness with tremors in B UEs and "shaking" LEs with increased activity Motor - Discharge Observations: functional for performing basic self care tasks and transfers  Mobility Bed Mobility Bed Mobility: Right Sidelying to Sit;Rolling Right;Sit to Sidelying Right;Rolling Left Rolling Right: Independent with assistive device Rolling Left: Independent with assistive device Right Sidelying to Sit: Independent with assistive device Supine to Sit: Independent with assistive device Sit to Supine: Independent with assistive device Sit to Sidelying Right: Independent with assistive device Transfers Transfers: Sit to Stand;Stand to Sit;Stand Pivot Transfers Sit to Stand: Independent with assistive device Stand to Sit: Independent with assistive device Stand Pivot Transfers: Independent with assistive device Transfer (Assistive device): Rolling walker Locomotion  Gait Ambulation: Yes Gait Assistance: Supervision/Verbal cueing Gait Distance (Feet): 295 Feet Assistive device: Rolling walker Gait Assistance Details: Verbal cues for safe use of DME/AE;Verbal cues for precautions/safety;Verbal cues for technique;Verbal cues for gait pattern Gait Gait: Yes Gait Pattern: Impaired Gait Pattern: Decreased step length - left;Decreased step length - right;Step-through pattern Gait velocity: decreased Stairs / Additional Locomotion Stairs: Yes Stairs Assistance: Supervision/Verbal cueing Stair Management Technique: Two rails Number of Stairs: 12 Height of Stairs: 6 Ramp: Supervision/Verbal cueing Curb: Supervision/Verbal cueing Wheelchair Mobility Wheelchair Mobility: No  Trunk/Postural Assessment  Cervical  Assessment Cervical Assessment: Within Functional Limits Thoracic Assessment Thoracic Assessment: Exceptions to WFL(limited mobility secondary to surgery) Lumbar Assessment Lumbar Assessment: Exceptions to WFL(limited mobility secondary to surgery) Postural Control Postural Control: Deficits on evaluation Postural Limitations: relies on RW for UE support during standing, but can release 1 hand at a time  Balance Balance Balance Assessed: Yes Static Sitting Balance Static Sitting - Balance Support: Feet supported Static Sitting - Level of Assistance: 7: Independent Dynamic Sitting Balance Dynamic Sitting - Balance Support: No upper extremity supported;During functional activity Dynamic Sitting - Level of Assistance: 5: Stand by assistance Dynamic Sitting - Balance Activities: Lateral lean/weight shifting;Forward lean/weight shifting Static Standing Balance Static Standing - Balance Support: Bilateral upper extremity supported Static Standing - Level of Assistance: 5: Stand by assistance Dynamic Standing Balance Dynamic Standing - Balance Support: During functional activity;Bilateral upper extremity supported Dynamic Standing - Level of Assistance: 5: Stand by assistance Dynamic Standing - Balance Activities: Lateral lean/weight shifting;Forward lean/weight shifting;Reaching for objects Extremity Assessment      RLE Assessment RLE Assessment: Exceptions to [facility] Active Range of Motion (AROM) Comments: WFL RLE Strength Right Hip Flexion: 4/5 Right Knee Flexion: 4+/5 Right Knee Extension: 4+/5 Right Ankle Dorsiflexion: 4+/5 Right Ankle Plantar Flexion: 4+/5 LLE Assessment LLE Assessment: Exceptions to [facility] Active Range of Motion (AROM) Comments: WFL LLE Strength Left Hip Flexion: 3-/5 Left Knee Flexion: 4/5 Left Knee Extension: 4/5 Left Ankle Dorsiflexion: 4+/5 Left Ankle Plantar Flexion: 4+/5    Breck Coons, PT, DPT 08/09/2019, 3:53 PM

## 2019-08-10 MED ORDER — ONDANSETRON HCL 4 MG PO TABS: 4.0000 mg | ORAL_TABLET | Freq: Every day | ORAL | 1 refills | Status: AC | PRN

## 2019-08-10 MED ORDER — HEPARIN SOD (PORK) LOCK FLUSH 100 UNIT/ML IV SOLN
250.0000 [IU] | INTRAVENOUS | Status: AC | PRN
  Administered 2019-08-10: 250 [IU]
  Filled 2019-08-10: qty 2.5

## 2019-08-10 NOTE — Progress Notes (Signed)
Patient discharged and left floor via WC accompanied by NT and daughter with all personal belongings. Patient discharged with RUA PICC in place, flushed, and capped by IV team for discharge home.

## 2019-08-10 NOTE — Plan of Care (Signed)
  Problem: Consults Goal: RH GENERAL PATIENT EDUCATION Description: See Patient Education module for education specifics. Outcome: Completed/Met   Problem: RH BOWEL ELIMINATION Goal: RH STG MANAGE BOWEL WITH ASSISTANCE Description: STG Manage Bowel with mod I Assistance. Outcome: Completed/Met Goal: RH STG MANAGE BOWEL W/MEDICATION W/ASSISTANCE Description: STG Manage Bowel with Medication with mod I Assistance. Outcome: Completed/Met   Problem: RH SKIN INTEGRITY Goal: RH STG SKIN FREE OF INFECTION/BREAKDOWN Description: Pt will be free of skin breakdown and infection prior to DC with supervision assist Outcome: Completed/Met   Problem: RH PAIN MANAGEMENT Goal: RH STG PAIN MANAGED AT OR BELOW PT'S PAIN GOAL Description: Less than 4 on 0-10 scale Outcome: Completed/Met

## 2019-08-10 NOTE — Progress Notes (Signed)
[place] PHYSICAL MEDICINE & REHABILITATION PROGRESS NOTE   Subjective/Complaints:  Concerned about pain meds, reviewed current use , does not require at night,Off Oxycodone and MSO4  pt requesting nausea medication (ondansetron)   ROS: Denies CP, SOB, N/V/D  Objective:   No results found. No results for input(s): WBC, HGB, HCT, PLT in the last 72 hours. Recent Labs    08/09/19 0407  CREATININE 0.73    Intake/Output Summary (Last 24 hours) at 08/10/2019 1014 Last data filed at 08/09/2019 1923 Gross per 24 hour  Intake 480 ml  Output --  Net 480 ml     Physical Exam: Vital Signs Blood pressure (!) 151/78, pulse 100, temperature 98.4 F (36.9 C), temperature source Oral, resp. rate 18, height 5\' 8"  (1.727 m), weight 100.4 kg, SpO2 100 %.  General: No acute distress Mood and affect are appropriate Heart: Regular rate and rhythm no rubs murmurs or extra sounds Lungs: Clear to auscultation, breathing unlabored, no rales or wheezes Abdomen: Positive bowel sounds, soft nontender to palpation, nondistended Extremities: No clubbing, cyanosis, or edema Skin: No evidence of breakdown, no evidence of rash Neurologic: Cranial nerves II through XII intact, motor strength is 5/5 in bilateral deltoid, bicep, tricep, grip,Right  hip flexor, knee extensors, ankle dorsiflexor and plantar flexor 3- Left HF, KE, Ankle DF  Musculoskeletal: Full range of motion in all 4 extremities. No joint swelling  Assessment/Plan: 1. Functional deficits secondary to lumbar radiculopathy status post lumbar fusion  Stable for D/C today F/u PCP in 3-4 weeks F/u PM&R 2 weeks See D/C summary See D/C instructions Care Tool:  Bathing    Body parts bathed by patient: Right arm, Left arm, Chest, Abdomen, Left upper leg, Right upper leg, Face, Right lower leg, Left lower leg, Front perineal area   Body parts bathed by helper: Buttocks     Bathing assist Assist Level: Minimal Assistance - Patient >  75%(per most recent staff documentation)     Upper Body Dressing/Undressing Upper body dressing Upper body dressing/undressing activity did not occur (including orthotics): N/A What is the patient wearing?: Bra, Pull over shirt    Upper body assist Assist Level: Set up assist(per most recent staff documentation)    Lower Body Dressing/Undressing Lower body dressing      What is the patient wearing?: Underwear/pull up, Pants     Lower body assist Assist for lower body dressing: Minimal Assistance - Patient > 75%(per most recent OT documentation)     Toileting Toileting    Toileting assist Assist for toileting: Minimal Assistance - Patient > 75% Assistive Device Comment: RW   Transfers Chair/bed transfer  Transfers assist     Chair/bed transfer assist level: Independent with assistive device Chair/bed transfer assistive device: Armrests, Programmer, multimedia   Ambulation assist      Assist level: Supervision/Verbal cueing Assistive device: Walker-rolling Max distance: 295'   Walk 10 feet activity   Assist     Assist level: Supervision/Verbal cueing Assistive device: Walker-rolling   Walk 50 feet activity   Assist    Assist level: Supervision/Verbal cueing Assistive device: Walker-rolling    Walk 150 feet activity   Assist    Assist level: Supervision/Verbal cueing Assistive device: Walker-rolling    Walk 10 feet on uneven surface  activity   Assist     Assist level: Supervision/Verbal cueing Assistive device: Aeronautical engineer Will patient use wheelchair at discharge?: No  Wheelchair 50 feet with 2 turns activity    Assist            Wheelchair 150 feet activity     Assist          Blood pressure (!) 151/78, pulse 100, temperature 98.4 F (36.9 C), temperature source Oral, resp. rate 18, height 5\' 8"  (1.727 m), weight 100.4 kg, SpO2 100 %.  Medical Problem  List and Plan: 1.  Decreased functional mobility secondary to acute on chronic back pain status post recent L2-3 fusion [id number] complicated by instrumentation failure with postoperative lumbar infection status post removal of previous hardware placement of posterior segmental instrumentation L1-S1 with pedicle screws L1, L2 revision biomechanical cage 07/24/2019.  Back brace when out of bed    Plan for DC today on Hydrocodone for pain , added ondansetron for nausea  Follow-up for transitional care management in 1 to 2 weeks post discharge. 2.  Antithrombotics: -DVT/anticoagulation: Lovenox.  Most recent vascular study negative  Creatinine within normal notes on 4/23             -antiplatelet therapy: N/A 3. Pain Management:   MS Contin 15 mg every 12 hours d/ced on 4/21, tolerating  Cymbalta 60 mg daily, hydrocodone as needed, Valium as needed for muscle spasms  Controlled with meds on 4/23  Monitor with increased exertion 4. Mood: Provide emotional support             -antipsychotic agents: Seroquel 25 mg nightly decreased to 12.5 on 4/21 due to ?confusion, tolerating, will consider further wean as outpatient 5. Neuropsych: This patient is capable of making decisions on her own behalf.  Discussed with Neuropsych - multifactorial confusion, appreciate rescs 6. Skin/Wound Care: Routine skin checks 7. Fluids/Electrolytes/Nutrition: Routine in and outs.   8.  ID/MS coags negative staph.  Continue IV antibiotic Ancef x6 weeks initiated 07/11/2019 9.  Acute on chronic anemia.    Hemoglobin 9.5 on 4/19  Continue to monitor 10.  Hypertension.  Toprol 12.5 mg daily.  Controlled on 4/23             Monitor with increased activity. 11.  Chronic constipation.  Continue to adjust bowel program  Bowel meds increased on 4/19  Improved 12.  Hypothyroidism.  Synthroid 13.  History of breast cancer.  Follow-up outpatient Dr. Jana Hakim.Continue Arimidex 14.  History of restless leg syndrome.  Mysoline 50  mg nightly, Requip 0.75 mg nightly 15.  Hyperlipidemia.  Lipitor 16.  Severe hypoalbuminemia  Continue protein supplement   LOS: 8 days A FACE TO FACE EVALUATION WAS PERFORMED  Charlett Blake 08/10/2019, 10:14 AM

## 2019-08-13 ENCOUNTER — Telehealth: Payer: Self-pay | Admitting: *Deleted

## 2019-08-13 ENCOUNTER — Encounter: Payer: Self-pay | Admitting: Internal Medicine

## 2019-08-13 DIAGNOSIS — I5032 Chronic diastolic (congestive) heart failure: Secondary | ICD-10-CM | POA: Diagnosis not present

## 2019-08-13 DIAGNOSIS — R569 Unspecified convulsions: Secondary | ICD-10-CM | POA: Diagnosis not present

## 2019-08-13 DIAGNOSIS — Z452 Encounter for adjustment and management of vascular access device: Secondary | ICD-10-CM | POA: Diagnosis not present

## 2019-08-13 DIAGNOSIS — R7303 Prediabetes: Secondary | ICD-10-CM | POA: Diagnosis not present

## 2019-08-13 DIAGNOSIS — G8929 Other chronic pain: Secondary | ICD-10-CM | POA: Diagnosis not present

## 2019-08-13 DIAGNOSIS — Z792 Long term (current) use of antibiotics: Secondary | ICD-10-CM | POA: Diagnosis not present

## 2019-08-13 DIAGNOSIS — E785 Hyperlipidemia, unspecified: Secondary | ICD-10-CM | POA: Diagnosis not present

## 2019-08-13 DIAGNOSIS — M109 Gout, unspecified: Secondary | ICD-10-CM | POA: Diagnosis not present

## 2019-08-13 DIAGNOSIS — M4626 Osteomyelitis of vertebra, lumbar region: Secondary | ICD-10-CM | POA: Diagnosis not present

## 2019-08-13 DIAGNOSIS — E039 Hypothyroidism, unspecified: Secondary | ICD-10-CM | POA: Diagnosis not present

## 2019-08-13 DIAGNOSIS — D649 Anemia, unspecified: Secondary | ICD-10-CM | POA: Diagnosis not present

## 2019-08-13 DIAGNOSIS — M1990 Unspecified osteoarthritis, unspecified site: Secondary | ICD-10-CM | POA: Diagnosis not present

## 2019-08-13 DIAGNOSIS — F1010 Alcohol abuse, uncomplicated: Secondary | ICD-10-CM | POA: Diagnosis not present

## 2019-08-13 DIAGNOSIS — A419 Sepsis, unspecified organism: Secondary | ICD-10-CM | POA: Diagnosis not present

## 2019-08-13 DIAGNOSIS — F329 Major depressive disorder, single episode, unspecified: Secondary | ICD-10-CM | POA: Diagnosis not present

## 2019-08-13 DIAGNOSIS — J3089 Other allergic rhinitis: Secondary | ICD-10-CM | POA: Diagnosis not present

## 2019-08-13 DIAGNOSIS — R29898 Other symptoms and signs involving the musculoskeletal system: Secondary | ICD-10-CM | POA: Diagnosis not present

## 2019-08-13 DIAGNOSIS — F419 Anxiety disorder, unspecified: Secondary | ICD-10-CM | POA: Diagnosis not present

## 2019-08-13 DIAGNOSIS — T84296A Other mechanical complication of internal fixation device of vertebrae, initial encounter: Secondary | ICD-10-CM | POA: Diagnosis not present

## 2019-08-13 DIAGNOSIS — K219 Gastro-esophageal reflux disease without esophagitis: Secondary | ICD-10-CM | POA: Diagnosis not present

## 2019-08-13 DIAGNOSIS — G2581 Restless legs syndrome: Secondary | ICD-10-CM | POA: Diagnosis not present

## 2019-08-13 DIAGNOSIS — M48061 Spinal stenosis, lumbar region without neurogenic claudication: Secondary | ICD-10-CM | POA: Diagnosis not present

## 2019-08-13 DIAGNOSIS — I110 Hypertensive heart disease with heart failure: Secondary | ICD-10-CM | POA: Diagnosis not present

## 2019-08-13 DIAGNOSIS — G629 Polyneuropathy, unspecified: Secondary | ICD-10-CM | POA: Diagnosis not present

## 2019-08-13 DIAGNOSIS — J449 Chronic obstructive pulmonary disease, unspecified: Secondary | ICD-10-CM | POA: Diagnosis not present

## 2019-08-13 NOTE — Telephone Encounter (Signed)
Transitional Care call--I spoke with her daughter [name]    1. Are you/is patient experiencing any problems since coming home? Are there any questions regarding any aspect of care? NO 2. Are there any questions regarding medications administration/dosing? Are meds being taken as prescribed? Patient should review meds with caller to confirm. They have all medications 3. Have there been any falls? NO 4. Has Home Health been to the house and/or have they contacted you? If not, have you tried to contact them? Can we help you contact them? The nurse has come out and drawn blood and wound care, still waiting on PT OT to reach out.  Will be on Abx through 08/21/19 and possible PICC line d/c (up to Inf Dz) 5. Are bowels and bladder emptying properly? Are there any unexpected incontinence issues? If applicable, is patient following bowel/bladder programs? NO PROBLEMS 6. Any fevers, problems with breathing, unexpected pain? NO FEVER OR RESP ISSUES, STILL HAVING QUITE A BIT OF PAIN.  REMINDED TO TRY AND TAPER PAIN MEDS IF POSSIBLE AND TO BRING BOTTLE TO THE APPT WITH EUNICE Lovell NP.  7. Are there any skin problems or new areas of breakdown? NO 8. Has the patient/family member arranged specialty MD follow up (ie cardiology/neurology/renal/surgical/etc)?  Can we help arrange? DAUGHTER IS IN CHARGE OF SCHEDULING THESE. APPT GIVEN TO SEE EUNICE THOMAS NO 1ST THEN FOLLOW UP WITH DR PATEL. 9. Does the patient need any other services or support that we can help arrange? NO 10. Are caregivers following through as expected in assisting the patient? YES 11. Has the patient quit smoking, drinking alcohol, or using drugs as recommended? YES  Appointment MAY [phone] ARRIVE BY 12:40 FOR 1:00 Perry Hall, NP NOTIFIED TO WATCH FOR PACKET FROM OUR OFFICE IN MAIL /FILL OUT AND BRING WITH TO APPOINTMENT ADDRESS REVIEWED. [address]

## 2019-08-14 ENCOUNTER — Telehealth: Payer: Self-pay

## 2019-08-14 NOTE — Telephone Encounter (Cosign Needed)
Patient's daughter is calling to see if IV antibiotics will be discontinued on Aug 21, 2019 and states they did not receive a follow up visit for hospital discharge .  Patient was scheduled for visit with Dr Megan Salon.   She states the patient is having a rash form the PICC dressing, nurse told her to call our office .   I will call [facility] to see what they are requesting. [facility] Nurse : Bettina Gavia DR:[phone] Laverle Patter RN

## 2019-08-14 NOTE — Telephone Encounter (Signed)
Spoke with Home heath nurse and suggested she check with [facility] regarding PICC dressing possible reaction to  dressing adhesive for switch options. Explained we do not manges the dressing changes , it is the responsibility of the nursing agency.   Laverle Patter, RN

## 2019-08-17 DIAGNOSIS — T8140XA Infection following a procedure, unspecified, initial encounter: Secondary | ICD-10-CM | POA: Diagnosis not present

## 2019-08-17 DIAGNOSIS — I5032 Chronic diastolic (congestive) heart failure: Secondary | ICD-10-CM | POA: Diagnosis not present

## 2019-08-17 DIAGNOSIS — Z981 Arthrodesis status: Secondary | ICD-10-CM | POA: Diagnosis not present

## 2019-08-20 ENCOUNTER — Telehealth: Payer: Self-pay

## 2019-08-20 ENCOUNTER — Encounter: Payer: Self-pay | Admitting: Internal Medicine

## 2019-08-20 DIAGNOSIS — M4626 Osteomyelitis of vertebra, lumbar region: Secondary | ICD-10-CM | POA: Diagnosis not present

## 2019-08-20 DIAGNOSIS — R29898 Other symptoms and signs involving the musculoskeletal system: Secondary | ICD-10-CM | POA: Diagnosis not present

## 2019-08-20 DIAGNOSIS — F1010 Alcohol abuse, uncomplicated: Secondary | ICD-10-CM | POA: Diagnosis not present

## 2019-08-20 DIAGNOSIS — I110 Hypertensive heart disease with heart failure: Secondary | ICD-10-CM | POA: Diagnosis not present

## 2019-08-20 DIAGNOSIS — T84296A Other mechanical complication of internal fixation device of vertebrae, initial encounter: Secondary | ICD-10-CM | POA: Diagnosis not present

## 2019-08-20 DIAGNOSIS — M109 Gout, unspecified: Secondary | ICD-10-CM | POA: Diagnosis not present

## 2019-08-20 DIAGNOSIS — K219 Gastro-esophageal reflux disease without esophagitis: Secondary | ICD-10-CM | POA: Diagnosis not present

## 2019-08-20 DIAGNOSIS — R569 Unspecified convulsions: Secondary | ICD-10-CM | POA: Diagnosis not present

## 2019-08-20 DIAGNOSIS — Z792 Long term (current) use of antibiotics: Secondary | ICD-10-CM | POA: Diagnosis not present

## 2019-08-20 DIAGNOSIS — I5032 Chronic diastolic (congestive) heart failure: Secondary | ICD-10-CM | POA: Diagnosis not present

## 2019-08-20 DIAGNOSIS — M48061 Spinal stenosis, lumbar region without neurogenic claudication: Secondary | ICD-10-CM | POA: Diagnosis not present

## 2019-08-20 DIAGNOSIS — G629 Polyneuropathy, unspecified: Secondary | ICD-10-CM | POA: Diagnosis not present

## 2019-08-20 DIAGNOSIS — A419 Sepsis, unspecified organism: Secondary | ICD-10-CM | POA: Diagnosis not present

## 2019-08-20 DIAGNOSIS — J3089 Other allergic rhinitis: Secondary | ICD-10-CM | POA: Diagnosis not present

## 2019-08-20 DIAGNOSIS — F419 Anxiety disorder, unspecified: Secondary | ICD-10-CM | POA: Diagnosis not present

## 2019-08-20 DIAGNOSIS — E039 Hypothyroidism, unspecified: Secondary | ICD-10-CM | POA: Diagnosis not present

## 2019-08-20 DIAGNOSIS — D649 Anemia, unspecified: Secondary | ICD-10-CM | POA: Diagnosis not present

## 2019-08-20 DIAGNOSIS — R7303 Prediabetes: Secondary | ICD-10-CM | POA: Diagnosis not present

## 2019-08-20 DIAGNOSIS — F329 Major depressive disorder, single episode, unspecified: Secondary | ICD-10-CM | POA: Diagnosis not present

## 2019-08-20 DIAGNOSIS — M1990 Unspecified osteoarthritis, unspecified site: Secondary | ICD-10-CM | POA: Diagnosis not present

## 2019-08-20 DIAGNOSIS — J449 Chronic obstructive pulmonary disease, unspecified: Secondary | ICD-10-CM | POA: Diagnosis not present

## 2019-08-20 DIAGNOSIS — G2581 Restless legs syndrome: Secondary | ICD-10-CM | POA: Diagnosis not present

## 2019-08-20 DIAGNOSIS — E785 Hyperlipidemia, unspecified: Secondary | ICD-10-CM | POA: Diagnosis not present

## 2019-08-20 DIAGNOSIS — G8929 Other chronic pain: Secondary | ICD-10-CM | POA: Diagnosis not present

## 2019-08-20 DIAGNOSIS — Z452 Encounter for adjustment and management of vascular access device: Secondary | ICD-10-CM | POA: Diagnosis not present

## 2019-08-20 NOTE — Telephone Encounter (Signed)
COVID-19 Pre-Screening Questions:08/20/19  Do you currently have a fever (>100 F), chills or unexplained body aches? NO   Are you currently experiencing new cough, shortness of breath, sore throat, runny nose?NO .  Have you recently travelled outside the state of [place] in the last 14 days? NO .  Have you been in contact with someone that is currently pending confirmation of Covid19 testing or has been confirmed to have the [place] virus?  NO  **If the patient answers NO to ALL questions -  advise the patient to please call the clinic before coming to the office should any symptoms develop.

## 2019-08-21 ENCOUNTER — Ambulatory Visit: Payer: PPO | Admitting: Internal Medicine

## 2019-08-21 ENCOUNTER — Other Ambulatory Visit: Payer: Self-pay

## 2019-08-21 DIAGNOSIS — M4626 Osteomyelitis of vertebra, lumbar region: Secondary | ICD-10-CM | POA: Diagnosis not present

## 2019-08-21 MED ORDER — CEPHALEXIN 500 MG PO CAPS: 500.0000 mg | ORAL_CAPSULE | Freq: Three times a day (TID) | ORAL | 5 refills | Status: DC

## 2019-08-21 NOTE — Assessment & Plan Note (Signed)
She is improving on therapy for postoperative coag negative staph lumbar infection.  Her inflammatory markers are improving as well as her clinical status.  I discussed management options with him today including continuing IV antibiotics a little longer, changing to oral antibiotic therapy or stopping all antibiotics now.  They prefer switching to oral antibiotic therapy so I will have her PICC removed and changed her to oral cephalexin.  She will follow-up here in 6 weeks.  Have instructed her to go ahead and get Covid vaccination.

## 2019-08-21 NOTE — Progress Notes (Signed)
[facility]  Patient Active Problem List   Diagnosis Date Noted  . Status post lumbar spinal fusion 07/10/2019    Priority: High  . Acute osteomyelitis of lumbar spine ([place]) 06/25/2019    Priority: High  . Confusion, postoperative   . Benign essential HTN   . Post-operative pain   . Chronic constipation   . 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 Post op infection 07/11/2019  . Muscle spasm   . Hypokalemia   . Postoperative pain   . Acute blood loss anemia   . Labile blood pressure   . Tobacco abuse   . Hypoalbuminemia due to protein-calorie malnutrition ([place])   . Anxiety 07/05/2019  . Depression 07/05/2019  . Chronic diastolic CHF (congestive heart failure) ([place]) 07/05/2019  . Obesity (BMI 30-39.9) 07/05/2019  . ETOH abuse 07/05/2019  . Lumbar radiculopathy 06/28/2019  . Spinal stenosis of lumbar region 06/28/2019  . Acute on chronic anemia   . Tremors of nervous system   . AKI (acute kidney injury) ([place])   . Chronic pain syndrome   . Acute renal failure superimposed on stage 3 chronic kidney disease ([place]) 06/25/2019  . Weakness of left lower extremity   . Abdominal pain 06/24/2019  . Nausea 06/24/2019  . Abnormal urine odor 06/24/2019  . History of sepsis 06/24/2019  . Recent major surgery 06/24/2019  . History of diverticulosis 06/24/2019  . Elevated PTHrP level 05/09/2019  . Paresthesia 02/12/2019  . Low back pain 02/12/2019  . Neuropathy 12/20/2018  . Lumbar spinal stenosis 06/29/2018  . Preoperative clearance 06/04/2018  . Abnormal CT of the chest 06/04/2018  . Smoker 05/17/2018  . Hypothyroidism 11/30/2017  . Thoracic aortic atherosclerosis ([place]) 09/14/2016  . Benign essential tremor 09/09/2016  . RLS (restless legs syndrome) 09/09/2016  . H/O adenomatous polyp of colon 07/06/2016  . PVC's (premature ventricular contractions) 03/29/2016  . Preoperative cardiovascular examination 03/29/2016  . Grade I diastolic  dysfunction 16/01/9603  . Palpitations 01/11/2016  . Shortness of breath 01/11/2016  . Former cigarette smoker 01/11/2016  . Leg edema 01/11/2016  . Chronic cough 10/28/2015  . 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 Malignant neoplasm of upper-inner quadrant of left breast in female, estrogen receptor positive ([place]) 07/14/2015  . Prediabetes 07/08/2015  . CKD (chronic kidney disease), stage III 07/08/2015  . Dependent edema 06/08/2015  . Sleep disturbance 06/08/2015  . Mixed hyperlipidemia 06/08/2015  . Generalized anxiety disorder 06/08/2015  . Essential hypertension 06/08/2015  . COPD (chronic obstructive pulmonary disease) ([place]) 06/08/2015  . Gout 06/08/2015  . Macrocytic anemia   . 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 Sepsis ([place]) 05/15/2015  . Lumbar spondylosis 03/20/2015    Patient's Medications  New Prescriptions   CEPHALEXIN (KEFLEX) 500 MG CAPSULE    Take 1 capsule (500 mg total) by mouth 3 (three) times daily.  Previous Medications   ACETAMINOPHEN (TYLENOL) 325 MG TABLET    Take 2 tablets (650 mg total) by mouth every 6 (six) hours as needed for moderate pain.   ALBUTEROL (VENTOLIN HFA) 108 (90 BASE) MCG/ACT INHALER    INHALE 2 PUFFS BY MOUTH EVERY 4 TO 6 HOURS AS NEEDED   ALLOPURINOL (ZYLOPRIM) 300 MG TABLET    TAKE 1 TABLET(300 MG) BY MOUTH DAILY   ANASTROZOLE (ARIMIDEX) 1 MG TABLET    TAKE 1 TABLET(1 MG) BY MOUTH DAILY   ATORVASTATIN (LIPITOR) 20 MG TABLET    Take 1 tablet (20 mg total)  by mouth daily.   CALCIUM CARB-CHOLECALCIFEROL (CALCIUM 1000 + D PO)    Take 1 tablet by mouth daily.   DIAZEPAM (VALIUM) 5 MG TABLET    Take 1 tablet (5 mg total) by mouth every 6 (six) hours as needed for muscle spasms.   DOCUSATE SODIUM (COLACE) 100 MG CAPSULE    Take 1 capsule (100 mg total) by mouth 3 (three) times daily.   DULOXETINE (CYMBALTA) 60 MG CAPSULE    TAKE 1 CAPSULE(60 MG) BY MOUTH DAILY   HYDROCODONE-ACETAMINOPHEN (NORCO) 10-325 MG TABLET    Take 1 tablet by mouth  every 4 (four) hours as needed for moderate pain.   LEVOTHYROXINE (SYNTHROID) 75 MCG TABLET    Take 1 tablet (75 mcg total) by mouth daily before breakfast.   MAGNESIUM GLUCONATE (MAGONATE) 500 MG TABLET    Take 2 tablets (1,000 mg total) by mouth at bedtime.   METOPROLOL SUCCINATE (TOPROL-XL) 25 MG 24 HR TABLET    Take 1 tablet (25 mg total) by mouth daily.   ONDANSETRON (ZOFRAN) 4 MG TABLET    Take 1 tablet (4 mg total) by mouth daily as needed for nausea or vomiting.   POLYETHYLENE GLYCOL POWDER (MIRALAX) POWDER    Take 17 g by mouth daily as needed for moderate constipation.    PRIMIDONE (MYSOLINE) 50 MG TABLET    TAKE 1 TABLET(50 MG) BY MOUTH AT BEDTIME   QUETIAPINE (SEROQUEL) 25 MG TABLET    Take 0.5 tablets (12.5 mg total) by mouth at bedtime.   ROPINIROLE (REQUIP) 0.25 MG TABLET    Take 3 tablets (0.75 mg total) by mouth at bedtime.   SENNA-DOCUSATE (SENOKOT-S) 8.6-50 MG TABLET    Take 2 tablets by mouth at bedtime.   VITAMIN B-12 (CYANOCOBALAMIN) 1000 MCG TABLET    Take 1 tablet (1,000 mcg total) by mouth daily.  Modified Medications   No medications on file  Discontinued Medications   CEFAZOLIN (ANCEF) IVPB    Inject 2 g into the vein every 8 (eight) hours. Indication:  Lumbar infection Last Day of Therapy:  08/21/2019 Labs - Once weekly:  CBC/D and BMP, Labs - Every other week:  ESR and CRP   CEFAZOLIN (ANCEF) IVPB    Inject 2 g into the vein every 8 (eight) hours for 14 days. Indication:  Osteomyelitis  First Dose: No Last Day of Therapy:  08/21/2019 Labs - Once weekly:  CBC/D and BMP, Labs - Every other week:  ESR and CRP Method of administration: IV Push Method of administration may be changed at the discretion of home infusion pharmacist based upon assessment of the patient and/or caregiver's ability to self-administer the medication ordered.    Subjective: Brandi Stephens is in for her hospital follow-up visit.  She is accompanied by her daughter.  She has a history of lumbar  stenosis and radiculopathy.  She underwent a third lumbar surgery on 05/30/2019.  She had decompression of L2-3 with extension of her previous fusion from L2-S1.  She was discharged home the following day.  She says that she had severe pain right after surgery that was unlike anything she had experienced with previous surgeries.  She said she felt like her back was swollen although there was no visible swelling, redness or unusual drainage from her incision.  The pain progressively worsened.  After about a week she also began to notice some left lower quadrant pain that she said felt like "ovarian pain".  She was treated with 2 rounds of  steroids without relief.  Because of worsening pain she was admitted to the hospital on 06/24/2019.  She has noted worsening pain and muscle spasms.  Because of the left lower quadrant pain she was evaluated for possible UTI.  She has not had any dysuria.  She was treated with ciprofloxacin from March 18-22 without any change in her pain.  Repeat CT scan obtained yesterday revealed:  Musculoskeletal: Multilevel degenerative changes of the spine L2-S1 disc spacer and posterior fusion. There is grade 1 L2-L3 retrolisthesis with progression of the posterior slippage of the disc spacer at L2-L3 compared to the prior CT. The L2 fusion screws breach the superior endplate of L2 similar to prior CT. There is bone lucency adjacent to the L2 screws in the posterior elements, left greater right and progressed since the prior CT. There is thickening of the prevertebral soft tissues at L2-L3 with several ill-defined small loculated fluid in the adjacent soft tissues and in the psoas musculature most consistent with small abscesses. There is irregularity of the L2 inferior endplate and L3 superior endplate. Posterior hardware is intact. No acute osseous pathology.  IMPRESSION: 1. Extensive postsurgical changes of the lumbar spine with posterior fusion at L2-S1. Interval posterior  slippage of the disc spacer at L2-L3 with findings of loosening of the left L2 screw. There are findings of infection and small paraspinal abscesses at L2-L3.  Fluid was aspirated from the L3-4 level.  No organisms were seen on Gram stain but cultures grew methicillin susceptible coagulase-negative staph.  A PICC was placed and she was started on IV cefazolin.  She has now completed 41 days of therapy.  She had further debridement and hardware revision on 07/24/2019.  Severe back spasm stopped immediately after that surgery.  She has now been discharged from rehab and is back at home doing outpatient physical therapy.  She is feeling much better.  Her severe pain has decreased, she is requiring less pain medication and she is able to be much more independent and active.  She rates her current pain as 5 out of 10.  She has not had any problems tolerating her PICC or antibiotics.  Review of Systems: Review of Systems  Constitutional: Negative for chills, diaphoresis and fever.  Respiratory: Negative for cough and shortness of breath.   Cardiovascular: Negative for chest pain.  Gastrointestinal: Negative for abdominal pain, diarrhea, nausea and vomiting.  Musculoskeletal: Positive for back pain.    Past Medical History:  Diagnosis Date  . AIN (anal intraepithelial neoplasia) anal canal   . Anemia    with pregnancy  . Anxiety   . Arthritis    knees, lower back, knees  . Chronic constipation   . Chronic diastolic CHF (congestive heart failure) ([place]) 07/05/2019  . CKD (chronic kidney disease)   . Coarse tremors    Essential  . COPD (chronic obstructive pulmonary disease) ([place])    2017 chest CT, history of  . Degenerative lumbar spinal stenosis   . Depression   . Depression 07/05/2019  . Drug dependency ([place])   . Elevated PTHrP level 05/09/2019  . ETOH abuse 07/05/2019  . GERD (gastroesophageal reflux disease)    pt denies  . Gout   . Grade I diastolic dysfunction 48/25/0037   Noted ECHO    . Hemorrhoids   . History of adenomatous polyp of colon   . History of basal cell carcinoma (BCC) excision 2015   nose  . History of sepsis 05/14/2014   post lumbar surgery  .  History of shingles    x2  . Hyperlipidemia   . Hypertension   . Hypothyroidism   . Irregular heart beat    history of while undergoing radiation  . Malignant neoplasm of upper-inner quadrant of left breast in female, estrogen receptor positive [facility]) oncologist-  dr Jana Hakim--  per lov in epic no recurrence   dx 07-13-2015  Left breast invasive ductal carcinoma, Stage IA, Grade 1 (TXN0),  09-16-2015  s/p  left breast lumpectomy with sln bx's,  completed radiation 11-18-2015,  started antiestrogen therapy 12-14-2015  . Neuropathy    Left foot and back of left leg  . Obesity (BMI 30-39.9) 07/05/2019  . Personal history of radiation therapy    completed 11-18-2015  left breast  . Pneumonia   . PONV (postoperative nausea and vomiting)   . Prediabetes    diet controlled, no med  . Restless leg syndrome   . Seasonal allergies   . Seizure ([place]) 05/2015   due to sepsis, only one time episode  . Tremor   . Wears partial dentures    lower     Social History   Tobacco Use  . Smoking status: Former Smoker    Packs/day: 0.50    Years: 35.00    Pack years: 17.50    Types: Cigarettes    Quit date: 05/22/2018    Years since quitting: 1.2  . Smokeless tobacco: Never Used  Substance Use Topics  . Alcohol use: Yes    Comment: 1 glass of wine a night  . Drug use: Never    Comment: CBD tincture    Family History  Problem Relation Age of Onset  . Atrial fibrillation Mother   . Ulcers Father   . Breast cancer Maternal Aunt   . Lung cancer Maternal Uncle   . Stomach cancer Maternal Grandmother   . Lung cancer Maternal Aunt   . Brain cancer Maternal Aunt   . Prostate cancer Maternal Grandfather   . Stroke Paternal Grandmother   . Tuberculosis Paternal Grandfather   . Colon cancer Neg Hx   . Esophageal  cancer Neg Hx   . Rectal cancer Neg Hx     Allergies  Allergen Reactions  . Ampicillin Hives and Other (See Comments)    Severe reaction in February 2017 1.5 month to have hives to go away  . Gabapentin Swelling    UNSPECIFIED REACTION   . Penicillins Hives and Other (See Comments)    Severe reaction in February 2017 1.5 month to have hives to go away Has patient had a PCN reaction causing immediate rash, facial/tongue/throat swelling, SOB or lightheadedness with hypotension: #  #  #  YES  #  #  #  Has patient had a PCN reaction causing severe rash involving mucus membranes or skin necrosis: No Has patient had a PCN reaction that required hospitalization No Has patient had a PCN reaction occurring within the last 10 years: No   . Codeine Nausea And Vomiting  . Hydrocodone-Acetaminophen Itching  . Other Itching    UNSPECIFIED Analgesics    Objective: There were no vitals filed for this visit. There is no height or weight on file to calculate BMI.  Physical Exam Constitutional:      Comments: She appears to be feeling much better than when I last saw her in the hospital.  Her spirits are good.  She is seated in a wheelchair.  Musculoskeletal:     Comments: Her back incision has healed nicely  without evidence of infection.  Skin:    Comments: Her right arm PICC site looks good.     Lab Results 08/20/2019 Sedimentation rate 64 C-reactive protein 15   Problem List Items Addressed This Visit      High   Acute osteomyelitis of lumbar spine ([place])    She is improving on therapy for postoperative coag negative staph lumbar infection.  Her inflammatory markers are improving as well as her clinical status.  I discussed management options with him today including continuing IV antibiotics a little longer, changing to oral antibiotic therapy or stopping all antibiotics now.  They prefer switching to oral antibiotic therapy so I will have her PICC removed and changed her to oral  cephalexin.  She will follow-up here in 6 weeks.  Have instructed her to go ahead and get Covid vaccination.      Relevant Medications   cephALEXin (KEFLEX) 500 MG capsule       [name], MD [facility] [place] [phone] pager   [phone] cell 08/21/2019, 4:12 PM

## 2019-08-21 NOTE — Progress Notes (Signed)
Per verbal order from Dr Megan Salon, 39 cm Single Lumen Peripherally Inserted Central Catheter removed from right basilic, tip intact. No sutures present. RN confirmed length per chart. Dressing was clean and dry. Petroleum dressing applied. Pt advised no heavy lifting with this arm, leave dressing for 24 hours and call the office or seek emergent care if dressing becomes soaked with blood or sharp pain presents. Patient verbalized understanding and agreement.  Patient's questions answered to their satisfaction. Patient tolerated procedure well, RN walked patient to check out. Landis Gandy, RN

## 2019-08-22 ENCOUNTER — Encounter: Payer: PPO | Attending: Registered Nurse | Admitting: Registered Nurse

## 2019-08-22 ENCOUNTER — Other Ambulatory Visit: Payer: Self-pay

## 2019-08-22 VITALS — BP 116/77 | HR 102 | Temp 95.7°F | Ht 67.0 in | Wt 226.0 lb

## 2019-08-22 DIAGNOSIS — I10 Essential (primary) hypertension: Secondary | ICD-10-CM

## 2019-08-22 DIAGNOSIS — Z981 Arthrodesis status: Secondary | ICD-10-CM

## 2019-08-22 DIAGNOSIS — G8918 Other acute postprocedural pain: Secondary | ICD-10-CM | POA: Diagnosis not present

## 2019-08-22 DIAGNOSIS — M4620 Osteomyelitis of vertebra, site unspecified: Secondary | ICD-10-CM

## 2019-08-22 DIAGNOSIS — M5416 Radiculopathy, lumbar region: Secondary | ICD-10-CM | POA: Diagnosis not present

## 2019-08-22 NOTE — Progress Notes (Signed)
Subjective:    Patient ID: Brandi Stephens, female    DOB: 12-13-1945, 74 y.o.   MRN: [id number]  HPI: Brandi Stephens is a 74 y.o. female who is here for Transitional Care Visit for follow up of her Lumbar Radiculopathy, Status Post Lumbar Spinal Fusion, Paraspinal Abscess, Essential Hypertension and Post- Operative Pain. She presented to [facility] on 06/25/2019 with complaints of increasing back pain and left lower extremity weakness. On 05/30/2019 she underwent  Lumbar two-three Posterior lumbar interbody fusion with interbody arthrodesis, bone morphogenic product, removal of previous rod, extension of fusion to Lumbar two N/A  By Dr. Kathyrn Sheriff. On 06/25/2019: MRI Lumbar Spine WO Contrast:  IMPRESSION: 1. Postoperative changes from recent extension of posterior decompression and fusion to the L2-3 level. Prominent marrow edema within the L2 and L3 vertebral bodies with soft tissue edema within the adjacent psoas musculature as above. While these findings may in part be postoperative in nature, appearance raises the concern for possible infection/osteomyelitis. Correlation with symptomatology and laboratory values recommended. Additionally, follow-up examination with postcontrast imaging may be helpful for further evaluation as warranted. 2. Persistent severe stenosis at the level of L2-3 with question circumferential epidural collection as above. Finding difficult to be certain given adjacent susceptibility artifact. Again, postcontrast imaging may be helpful. Additionally, correlation with dedicated CT with contrast could also be performed as well. 3. Approximate 5.0 x 4.7 x 7.4 cm heterogeneous collection within the posterior paraspinous soft tissues extending from L3 through L5-S1. Differential considerations include a postoperative hematoma versus complicated seroma. Superimposed infection could be considered in the correct clinical setting. Neuro-Surgery was consulted:ID was  Consulted   Brandi Stephens Stay: See Discharge Summary for further Details.   She underwent on 07/24/2019 by Dr Kathyrn Sheriff  REVISION OF HARDWARE Lumbar two - Sacral one. Extention of Fusion to Lumbar one N/A   Brandi Stephens was admitted to Inpatient Rehabilitation on 08/02/2019 and discharged home on 08/10/2019.She is receiving Home Health Therapy with [facility]. She states she has pain in her lower back. She rates her pain 7. Also reports her appetite is fair.  She received Oxycodone prescription from Dr. Kathyrn Sheriff office [facility]) on 08/20/2019. She is also prescribed Diazepam  by Ferne Reus.We have discussed the black box warning of using opioids and benzodiazepines. I highlighted the dangers of using these drugs together and discussed the adverse events including respiratory suppression, overdose, cognitive impairment and importance of compliance with current regimen. We will continue to monitor and adjust as indicated.   Brandi Stephens reports her PICC Line was Removed on 08/21/2019, ID Following.   Daughter In Room.  ' Pain Inventory Average Pain 5 Pain Right Now 7 My pain is constant, sharp, burning, stabbing and aching  In the last 24 hours, has pain interfered with the following? General activity 8 Relation with others 8 Enjoyment of life 8 What TIME of day is your pain at its worst? all Sleep (in general) Fair  Pain is worse with: walking, bending, sitting, standing and some activites Pain improves with: rest, heat/ice, therapy/exercise and medication Relief from Meds: 4  Mobility walk without assistance use a walker how many minutes can you walk? 5 ability to climb steps?  yes do you drive?  no  Function retired I need assistance with the following:  bathing, meal prep, household duties and shopping  Neuro/Psych weakness numbness tremor tingling trouble walking dizziness confusion  Prior Studies Any changes since last visit?   no  Physicians involved in your care Any changes since last visit?  no   Family History  Problem Relation Age of Onset  . Atrial fibrillation Mother   . Ulcers Father   . Breast cancer Maternal Aunt   . Lung cancer Maternal Uncle   . Stomach cancer Maternal Grandmother   . Lung cancer Maternal Aunt   . Brain cancer Maternal Aunt   . Prostate cancer Maternal Grandfather   . Stroke Paternal Grandmother   . Tuberculosis Paternal Grandfather   . Colon cancer Neg Hx   . Esophageal cancer Neg Hx   . Rectal cancer Neg Hx    Social History   Socioeconomic History  . Marital status: Married    Spouse name: robert  . Number of children: 3  . Years of education: college  . Highest education level: Not on file  Occupational History  . Occupation: retired  Tobacco Use  . Smoking status: Former Smoker    Packs/day: 0.50    Years: 35.00    Pack years: 17.50    Types: Cigarettes    Quit date: 05/22/2018    Years since quitting: 1.2  . Smokeless tobacco: Never Used  Substance and Sexual Activity  . Alcohol use: Yes    Comment: 1 glass of wine a night  . Drug use: Never    Comment: CBD tincture  . Sexual activity: Yes    Birth control/protection: Post-menopausal  Other Topics Concern  . Not on file  Social History Narrative   Lives with husband in a 2 story home.  Has 2 living children.  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  Education: college.    Right-handed.   Five cups coffee per week.   Social Determinants of Health   Financial Resource Strain:   . Difficulty of Paying Living Expenses:   Food Insecurity:   . Worried About Charity fundraiser in the Last Year:   . Arboriculturist in the Last Year:   Transportation Needs:   . Film/video editor (Medical):   Marland Kitchen Lack of Transportation (Non-Medical):   Physical Activity:   . Days of Exercise per Week:   . Minutes of Exercise per Session:   Stress:   . Feeling of Stress :   Social Connections:   .  Frequency of Communication with Friends and Family:   . Frequency of Social Gatherings with Friends and Family:   . Attends Religious Services:   . Active Member of Clubs or Organizations:   . Attends Archivist Meetings:   Marland Kitchen Marital Status:    Past Surgical History:  Procedure Laterality Date  . ABDOMINAL HYSTERECTOMY  1985  . BASAL CELL CARCINOMA EXCISION  10/15   nose  . BREAST BIOPSY Right 08/24/2015  . BREAST BIOPSY Right 07/13/2015  . BREAST BIOPSY Left 07/13/2015  . BREAST EXCISIONAL BIOPSY Left 1972  . BREAST LUMPECTOMY Left   . BREAST LUMPECTOMY WITH RADIOACTIVE SEED AND SENTINEL LYMPH NODE BIOPSY Left 09/16/2015   Procedure: BREAST LUMPECTOMY WITH RADIOACTIVE SEED AND SENTINEL LYMPH NODE BIOPSY;  Surgeon: Stark Klein, MD;  Location: [facility];  Service: General;  Laterality: Left;  . BREAST REDUCTION SURGERY Bilateral 1998  . CATARACT EXTRACTION W/ INTRAOCULAR LENS IMPLANT Left 2016  . [place]   hallo  . COLONOSCOPY  01/2013   polyps  . EYE SURGERY Left 2016  . HAND SURGERY Left 02-19-2002   dr Fredna Dow  @[facility]   repair collateral ligament/ MPJ left thumb  . HARDWARE REMOVAL N/A 07/24/2019   Procedure: REVISION OF HARDWARE Lumbar two - Sacral one. Extention of Fusion to Lumbar one;  Surgeon: [name], MD;  Location: [facility];  Service: Neurosurgery;  Laterality: N/A;  . HEMORRHOID SURGERY  2008  . HIGH RESOLUTION ANOSCOPY N/A 02/28/2018   Procedure: HIGH RESOLUTION ANOSCOPY WITH BIOPSY;  Surgeon: Leighton Ruff, MD;  Location: [facility];  Service: General;  Laterality: N/A;  . KNEE ARTHROSCOPY Right 2002  . LUMBAR SPINE SURGERY  03-20-2015   dr Kathyrn Sheriff   fusion L4-5, L5-S1  . MOUTH SURGERY  07/14/2018   2 infected teeth removed  . MULTIPLE TOOTH EXTRACTIONS     with bone grafting lower bottom right and left  . REDUCTION MAMMAPLASTY Bilateral   . RIGHT COLECTOMY  09-10-2003   dr Margot Chimes @[facility]     multiple colon polyps (per path tubular adenoma's, hyperplastic , benign appendix, benign two lymph nodes  . ROTATOR CUFF REPAIR Left 04/2016  . TONSILLECTOMY  1969  . TUBAL LIGATION Bilateral 1982   Past Medical History:  Diagnosis Date  . AIN (anal intraepithelial neoplasia) anal canal   . Anemia    with pregnancy  . Anxiety   . Arthritis    knees, lower back, knees  . Chronic constipation   . Chronic diastolic CHF (congestive heart failure) ([place]) 07/05/2019  . CKD (chronic kidney disease)   . Coarse tremors    Essential  . COPD (chronic obstructive pulmonary disease) ([place])    2017 chest CT, history of  . Degenerative lumbar spinal stenosis   . Depression   . Depression 07/05/2019  . Drug dependency ([place])   . Elevated PTHrP level 05/09/2019  . ETOH abuse 07/05/2019  . GERD (gastroesophageal reflux disease)    pt denies  . Gout   . Grade I diastolic dysfunction 0000000   Noted ECHO  . Hemorrhoids   . History of adenomatous polyp of colon   . History of basal cell carcinoma (BCC) excision 2015   nose  . History of sepsis 05/14/2014   post lumbar surgery  . History of shingles    x2  . Hyperlipidemia   . Hypertension   . Hypothyroidism   . Irregular heart beat    history of while undergoing radiation  . Malignant neoplasm of upper-inner quadrant of left breast in female, estrogen receptor positive [facility] Stephens) oncologist-  dr Jana Hakim--  per lov in epic no recurrence   dx 07-13-2015  Left breast invasive ductal carcinoma, Stage IA, Grade 1 (TXN0),  09-16-2015  s/p  left breast lumpectomy with sln bx's,  completed radiation 11-18-2015,  started antiestrogen therapy 12-14-2015  . Neuropathy    Left foot and back of left leg  . Obesity (BMI 30-39.9) 07/05/2019  . Personal history of radiation therapy    completed 11-18-2015  left breast  . Pneumonia   . PONV (postoperative nausea and vomiting)   . Prediabetes    diet controlled, no med  . Restless leg syndrome   . Seasonal  allergies   . Seizure ([place]) 05/2015   due to sepsis, only one time episode  . Tremor   . Wears partial dentures    lower    BP 116/77   Pulse (!) 102   Temp (!) 95.7 F (35.4 C)   Ht 5\' 7"  (1.702 m)   Wt 226 lb (102.5 kg)   SpO2 93%   BMI 35.40 kg/m  Opioid Risk Score:   Fall Risk Score:  `1  Depression screen PHQ 2/9  Depression screen [facility] Stephens 2/9 08/22/2019 03/18/2019 03/07/2019 12/20/2018 11/30/2017 07/06/2016  Decreased Interest 0 0 0 0 0 0  Down, Depressed, Hopeless 0 0 0 0 0 0  PHQ - 2 Score 0 0 0 0 0 0  Altered sleeping 3 - - - - -  Tired, decreased energy 2 - - - - -  Change in appetite 2 - - - - -  Feeling bad or failure about yourself  1 - - - - -  Trouble concentrating 1 - - - - -  Moving slowly or fidgety/restless 0 - - - - -  Suicidal thoughts 0 - - - - -  PHQ-9 Score 9 - - - - -  Difficult doing work/chores Not difficult at all - - - - -  Some recent data might be hidden    Review of Systems  Constitutional: Positive for appetite change.  Gastrointestinal: Positive for abdominal pain and nausea.  Neurological: Positive for dizziness, weakness and numbness.  Psychiatric/Behavioral: Positive for confusion.  All other systems reviewed and are negative.      Objective:   Physical Exam Vitals and nursing note reviewed.  Constitutional:      Appearance: Normal appearance.  Cardiovascular:     Rate and Rhythm: Normal rate and regular rhythm.     Pulses: Normal pulses.     Heart sounds: Normal heart sounds.  Pulmonary:     Effort: Pulmonary effort is normal.     Breath sounds: Normal breath sounds.  Musculoskeletal:     Cervical back: Normal range of motion and neck supple.     Comments: Normal Muscle Bulk and Muscle Testing Reveals:  Upper Extremities: Full ROM and Muscle Strength on the Right 5/5 and Left 4/5 Lumbar Hypersensitivity Lower Extremities: Right: Full ROM and Muscle Strength 5/5 Left: Decreased ROM and Muscle Strength 5/5 Arrived in  [place]  Skin:    General: Skin is warm and dry.  Neurological:     Mental Status: She is alert and oriented to person, place, and time.  Psychiatric:        Mood and Affect: Mood normal.        Behavior: Behavior normal.           Assessment & Plan:  1.Lumbar Radiculopathy: Status Post Lumbar Spinal Fusion:  Neuro Surgery Following:  Continue to Monitor. 2.Paraspinal Abscess: ID Following. Continue to Monitor. 3. Essential Hypertension: Continue Current Medication. 4. Post- Operative Pain.Neuro-Surgery Following. Continue Current Medication. Continue to monitor.  20  minutes of face to face patient care time was spent during this visit. All questions were encouraged and answered.  F/U with Dr Ranell Patrick in 4-6 weeks. ;Patient's Request:  Spoke with Dr Posey Pronto and Lattie Haw Regarding the above.

## 2019-08-26 DIAGNOSIS — Z792 Long term (current) use of antibiotics: Secondary | ICD-10-CM | POA: Diagnosis not present

## 2019-08-26 DIAGNOSIS — E785 Hyperlipidemia, unspecified: Secondary | ICD-10-CM | POA: Diagnosis not present

## 2019-08-26 DIAGNOSIS — D649 Anemia, unspecified: Secondary | ICD-10-CM | POA: Diagnosis not present

## 2019-08-26 DIAGNOSIS — M1990 Unspecified osteoarthritis, unspecified site: Secondary | ICD-10-CM | POA: Diagnosis not present

## 2019-08-26 DIAGNOSIS — F419 Anxiety disorder, unspecified: Secondary | ICD-10-CM | POA: Diagnosis not present

## 2019-08-26 DIAGNOSIS — E039 Hypothyroidism, unspecified: Secondary | ICD-10-CM | POA: Diagnosis not present

## 2019-08-26 DIAGNOSIS — G2581 Restless legs syndrome: Secondary | ICD-10-CM | POA: Diagnosis not present

## 2019-08-26 DIAGNOSIS — R7303 Prediabetes: Secondary | ICD-10-CM | POA: Diagnosis not present

## 2019-08-26 DIAGNOSIS — M48061 Spinal stenosis, lumbar region without neurogenic claudication: Secondary | ICD-10-CM | POA: Diagnosis not present

## 2019-08-26 DIAGNOSIS — R29898 Other symptoms and signs involving the musculoskeletal system: Secondary | ICD-10-CM | POA: Diagnosis not present

## 2019-08-26 DIAGNOSIS — I110 Hypertensive heart disease with heart failure: Secondary | ICD-10-CM | POA: Diagnosis not present

## 2019-08-26 DIAGNOSIS — M109 Gout, unspecified: Secondary | ICD-10-CM | POA: Diagnosis not present

## 2019-08-26 DIAGNOSIS — F329 Major depressive disorder, single episode, unspecified: Secondary | ICD-10-CM | POA: Diagnosis not present

## 2019-08-26 DIAGNOSIS — A419 Sepsis, unspecified organism: Secondary | ICD-10-CM | POA: Diagnosis not present

## 2019-08-26 DIAGNOSIS — Z452 Encounter for adjustment and management of vascular access device: Secondary | ICD-10-CM | POA: Diagnosis not present

## 2019-08-26 DIAGNOSIS — M4626 Osteomyelitis of vertebra, lumbar region: Secondary | ICD-10-CM | POA: Diagnosis not present

## 2019-08-26 DIAGNOSIS — G8929 Other chronic pain: Secondary | ICD-10-CM | POA: Diagnosis not present

## 2019-08-26 DIAGNOSIS — K219 Gastro-esophageal reflux disease without esophagitis: Secondary | ICD-10-CM | POA: Diagnosis not present

## 2019-08-26 DIAGNOSIS — J449 Chronic obstructive pulmonary disease, unspecified: Secondary | ICD-10-CM | POA: Diagnosis not present

## 2019-08-26 DIAGNOSIS — T84296A Other mechanical complication of internal fixation device of vertebrae, initial encounter: Secondary | ICD-10-CM | POA: Diagnosis not present

## 2019-08-26 DIAGNOSIS — J3089 Other allergic rhinitis: Secondary | ICD-10-CM | POA: Diagnosis not present

## 2019-08-26 DIAGNOSIS — R569 Unspecified convulsions: Secondary | ICD-10-CM | POA: Diagnosis not present

## 2019-08-26 DIAGNOSIS — F1010 Alcohol abuse, uncomplicated: Secondary | ICD-10-CM | POA: Diagnosis not present

## 2019-08-26 DIAGNOSIS — G629 Polyneuropathy, unspecified: Secondary | ICD-10-CM | POA: Diagnosis not present

## 2019-08-26 DIAGNOSIS — I5032 Chronic diastolic (congestive) heart failure: Secondary | ICD-10-CM | POA: Diagnosis not present

## 2019-08-27 ENCOUNTER — Telehealth: Payer: Self-pay

## 2019-08-27 ENCOUNTER — Other Ambulatory Visit: Payer: Self-pay | Admitting: Internal Medicine

## 2019-08-27 DIAGNOSIS — M4626 Osteomyelitis of vertebra, lumbar region: Secondary | ICD-10-CM

## 2019-08-27 MED ORDER — FLUCONAZOLE 100 MG PO TABS: 100.0000 mg | ORAL_TABLET | Freq: Every day | ORAL | 1 refills | Status: DC

## 2019-08-27 NOTE — Telephone Encounter (Signed)
Patient called office today with concerns for tongue swelling. States that tongue has only been swollen for a day and a half. Describes her tongue as being bright red "like eating a red popsicle." Does not have swelling anywhere else. Denies difficulty swallowing or breathing. Patient feels like it might be thrush. Would like to have MD advise on what she should do. Advised patient if swelling gets worse or has any issues breathing/swallowing to contact 911. [place]

## 2019-08-27 NOTE — Telephone Encounter (Signed)
Please let her know that I sent in a prescription for oral fluconazole for thrush.

## 2019-08-29 ENCOUNTER — Encounter: Payer: Self-pay | Admitting: Registered Nurse

## 2019-08-29 DIAGNOSIS — M48061 Spinal stenosis, lumbar region without neurogenic claudication: Secondary | ICD-10-CM | POA: Diagnosis not present

## 2019-09-02 DIAGNOSIS — R7303 Prediabetes: Secondary | ICD-10-CM | POA: Diagnosis not present

## 2019-09-02 DIAGNOSIS — D649 Anemia, unspecified: Secondary | ICD-10-CM | POA: Diagnosis not present

## 2019-09-02 DIAGNOSIS — K219 Gastro-esophageal reflux disease without esophagitis: Secondary | ICD-10-CM | POA: Diagnosis not present

## 2019-09-02 DIAGNOSIS — G8929 Other chronic pain: Secondary | ICD-10-CM | POA: Diagnosis not present

## 2019-09-02 DIAGNOSIS — F329 Major depressive disorder, single episode, unspecified: Secondary | ICD-10-CM | POA: Diagnosis not present

## 2019-09-02 DIAGNOSIS — J449 Chronic obstructive pulmonary disease, unspecified: Secondary | ICD-10-CM | POA: Diagnosis not present

## 2019-09-02 DIAGNOSIS — I5032 Chronic diastolic (congestive) heart failure: Secondary | ICD-10-CM | POA: Diagnosis not present

## 2019-09-02 DIAGNOSIS — I110 Hypertensive heart disease with heart failure: Secondary | ICD-10-CM | POA: Diagnosis not present

## 2019-09-02 DIAGNOSIS — F419 Anxiety disorder, unspecified: Secondary | ICD-10-CM | POA: Diagnosis not present

## 2019-09-02 DIAGNOSIS — G629 Polyneuropathy, unspecified: Secondary | ICD-10-CM | POA: Diagnosis not present

## 2019-09-02 DIAGNOSIS — Z792 Long term (current) use of antibiotics: Secondary | ICD-10-CM | POA: Diagnosis not present

## 2019-09-02 DIAGNOSIS — Z452 Encounter for adjustment and management of vascular access device: Secondary | ICD-10-CM | POA: Diagnosis not present

## 2019-09-02 DIAGNOSIS — M1990 Unspecified osteoarthritis, unspecified site: Secondary | ICD-10-CM | POA: Diagnosis not present

## 2019-09-02 DIAGNOSIS — F1010 Alcohol abuse, uncomplicated: Secondary | ICD-10-CM | POA: Diagnosis not present

## 2019-09-02 DIAGNOSIS — R29898 Other symptoms and signs involving the musculoskeletal system: Secondary | ICD-10-CM | POA: Diagnosis not present

## 2019-09-02 DIAGNOSIS — J3089 Other allergic rhinitis: Secondary | ICD-10-CM | POA: Diagnosis not present

## 2019-09-02 DIAGNOSIS — E785 Hyperlipidemia, unspecified: Secondary | ICD-10-CM | POA: Diagnosis not present

## 2019-09-02 DIAGNOSIS — T84296A Other mechanical complication of internal fixation device of vertebrae, initial encounter: Secondary | ICD-10-CM | POA: Diagnosis not present

## 2019-09-02 DIAGNOSIS — E039 Hypothyroidism, unspecified: Secondary | ICD-10-CM | POA: Diagnosis not present

## 2019-09-02 DIAGNOSIS — M48061 Spinal stenosis, lumbar region without neurogenic claudication: Secondary | ICD-10-CM | POA: Diagnosis not present

## 2019-09-02 DIAGNOSIS — M109 Gout, unspecified: Secondary | ICD-10-CM | POA: Diagnosis not present

## 2019-09-02 DIAGNOSIS — M4626 Osteomyelitis of vertebra, lumbar region: Secondary | ICD-10-CM | POA: Diagnosis not present

## 2019-09-02 DIAGNOSIS — A419 Sepsis, unspecified organism: Secondary | ICD-10-CM | POA: Diagnosis not present

## 2019-09-02 DIAGNOSIS — G2581 Restless legs syndrome: Secondary | ICD-10-CM | POA: Diagnosis not present

## 2019-09-02 DIAGNOSIS — R569 Unspecified convulsions: Secondary | ICD-10-CM | POA: Diagnosis not present

## 2019-09-06 ENCOUNTER — Encounter: Payer: Self-pay | Admitting: Family Medicine

## 2019-09-06 ENCOUNTER — Ambulatory Visit (INDEPENDENT_AMBULATORY_CARE_PROVIDER_SITE_OTHER): Payer: PPO | Admitting: Family Medicine

## 2019-09-06 ENCOUNTER — Other Ambulatory Visit: Payer: Self-pay

## 2019-09-06 VITALS — BP 130/80 | HR 93 | Wt 220.8 lb

## 2019-09-06 DIAGNOSIS — R110 Nausea: Secondary | ICD-10-CM

## 2019-09-06 DIAGNOSIS — E039 Hypothyroidism, unspecified: Secondary | ICD-10-CM | POA: Diagnosis not present

## 2019-09-06 DIAGNOSIS — Z792 Long term (current) use of antibiotics: Secondary | ICD-10-CM

## 2019-09-06 DIAGNOSIS — R197 Diarrhea, unspecified: Secondary | ICD-10-CM

## 2019-09-06 DIAGNOSIS — R296 Repeated falls: Secondary | ICD-10-CM | POA: Insufficient documentation

## 2019-09-06 DIAGNOSIS — Z09 Encounter for follow-up examination after completed treatment for conditions other than malignant neoplasm: Secondary | ICD-10-CM | POA: Diagnosis not present

## 2019-09-06 DIAGNOSIS — I5032 Chronic diastolic (congestive) heart failure: Secondary | ICD-10-CM | POA: Diagnosis not present

## 2019-09-06 MED ORDER — ROPINIROLE HCL 0.25 MG PO TABS: 0.7500 mg | ORAL_TABLET | Freq: Every day | ORAL | 0 refills | Status: DC

## 2019-09-06 NOTE — Progress Notes (Signed)
Subjective:    Patient ID: Brandi Stephens, female    DOB: [birth year], 74 y.o.   MRN: [id number]  HPI Chief Complaint  Patient presents with  . hospital follow-up    47 day in hospital. due to back staph infection in spine. on oral antibiotics   She is here with her daughter for a follow up visit since being hospitalized for 47 days due to back surgery and paraspinal abscess. She has been home for approximately 1 month. She is being closely followed by ID, neurosurgery and rehab specialist.  She is using a walker.  States she has fallen a couple of times at home due to trying to get to the bathroom with diarrhea. She is receiving PT and OT in home.   Complains of brown and loose diarrhea since 05/09.  Having diarrhea 2-3 times per day but not every day.  She she has been taking a probiotic since being home and has also been taking an high-protein supplements due to low serum protein in the hospital.  States the doctor told her she will be on oral ABX indefinitely.  She is on Keflex.  She has intermittent nausea and states is due to pain.  States she is always had this as a pain reaction.- takes Zofran.   States she had acute renal failure and was in ICU.   Taking Seroquel 25 mg and would like for me to refill this when she is due.  States she is sleeping well with this medication.  States she is taking Requip still and this is helpful Cymbalta 60 mg helps nerve pain.    Follow up visits scheduled with her specialists ID - Dr. Megan Salon on 09/24/2019   No recent fever, chills, dizziness, chest pain, palpitations, shortness of breath, cough, vomiting, urinary symptoms, LE edema.   Reviewed allergies, medications, past medical, surgical, family, and social history.   Review of Systems Pertinent positives and negatives in the history of present illness.     Objective:   Physical Exam BP 130/80   Pulse 93   Wt 220 lb 12.8 oz (100.2 kg)   BMI 34.58 kg/m  Alert and in  no distress.  Cardiac exam shows a regular sinus rhythm without murmurs or gallops. Lungs are clear to auscultation. Extremities without edema. Skin is warm and dry.  Of note- I did not have her get on the exam table due to recent surgery and pain with movement     Assessment & Plan:  Hospital discharge follow-up  Diarrhea, unspecified type - Plan: CBC with Differential/Platelet, Comprehensive metabolic panel, Cdiff NAA+O+P+Stool Culture, Cdiff NAA+O+P+Stool Culture, CANCELED: Cdiff NAA+O+P+Stool Culture -discussed multiple etiologies. Symptoms do not sound like C-diff but I will check stool studies due to her recent multiple antibiotic therapies. No sign of dehydration. Recent new start of probiotic and large amounts of supplemental protein may be contributing.    Antibiotic long-term use - Plan: Cdiff NAA+O+P+Stool Culture, Cdiff NAA+O+P+Stool Culture, CANCELED: Cdiff NAA+O+P+Stool Culture She is taking Keflex and was told she would be on this indefinitely   Chronic diastolic CHF (congestive heart failure) (HCC) -euvolemic. Continue medications. I will check labs.   Hypothyroidism, unspecified type - Plan: TSH, T4, free -follow up pending thyroid function   Nausea - she is taking Zofran prn  Recurrent falls -falls associated with diarrhea and having to get to the bathroom quickly. Hopeful diarrhea will resolve soon. Discussed bedside toilet as she is currently having to walk a good distance to her  bathroom. Continue PT and OT.

## 2019-09-07 LAB — COMPREHENSIVE METABOLIC PANEL
ALT: 7 IU/L (ref 0–32)
AST: 15 IU/L (ref 0–40)
Albumin/Globulin Ratio: 1.9 (ref 1.2–2.2)
Albumin: 4.3 g/dL (ref 3.7–4.7)
Alkaline Phosphatase: 114 IU/L (ref 48–121)
BUN/Creatinine Ratio: 18 (ref 12–28)
BUN: 14 mg/dL (ref 8–27)
Bilirubin Total: <0.2 mg/dL (ref 0.0–1.2)
CO2: 23 mmol/L (ref 20–29)
Calcium: 10.2 mg/dL (ref 8.7–10.3)
Chloride: 101 mmol/L (ref 96–106)
Creatinine, Ser: 0.76 mg/dL (ref 0.57–1.00)
GFR calc Af Amer: 90 mL/min/{1.73_m2} (ref >59)
GFR calc non Af Amer: 78 mL/min/{1.73_m2} (ref >59)
Globulin, Total: 2.3 g/dL (ref 1.5–4.5)
Glucose: 162 mg/dL — ABNORMAL HIGH (ref 65–99)
Potassium: 4.8 mmol/L (ref 3.5–5.2)
Sodium: 140 mmol/L (ref 134–144)
Total Protein: 6.6 g/dL (ref 6.0–8.5)

## 2019-09-07 LAB — CBC WITH DIFFERENTIAL/PLATELET
Basophils Absolute: 0.1 10*3/uL (ref 0.0–0.2)
Basos: 1 %
EOS (ABSOLUTE): 0.3 10*3/uL (ref 0.0–0.4)
Eos: 4 %
Hematocrit: 40.3 % (ref 34.0–46.6)
Hemoglobin: 12.5 g/dL (ref 11.1–15.9)
Immature Grans (Abs): 0.1 10*3/uL (ref 0.0–0.1)
Immature Granulocytes: 1 %
Lymphocytes Absolute: 1.5 10*3/uL (ref 0.7–3.1)
Lymphs: 18 %
MCH: 27.8 pg (ref 26.6–33.0)
MCHC: 31 g/dL — ABNORMAL LOW (ref 31.5–35.7)
MCV: 90 fL (ref 79–97)
Monocytes Absolute: 0.6 10*3/uL (ref 0.1–0.9)
Monocytes: 7 %
Neutrophils Absolute: 6 10*3/uL (ref 1.4–7.0)
Neutrophils: 69 %
Platelets: 398 10*3/uL (ref 150–450)
RBC: 4.5 x10E6/uL (ref 3.77–5.28)
RDW: 16.2 % — ABNORMAL HIGH (ref 11.7–15.4)
WBC: 8.6 10*3/uL (ref 3.4–10.8)

## 2019-09-07 LAB — TSH: TSH: 1.77 u[IU]/mL (ref 0.450–4.500)

## 2019-09-07 LAB — T4, FREE: Free T4: 1 ng/dL (ref 0.82–1.77)

## 2019-09-11 DIAGNOSIS — M4626 Osteomyelitis of vertebra, lumbar region: Secondary | ICD-10-CM | POA: Diagnosis not present

## 2019-09-11 DIAGNOSIS — Z452 Encounter for adjustment and management of vascular access device: Secondary | ICD-10-CM | POA: Diagnosis not present

## 2019-09-11 DIAGNOSIS — R7303 Prediabetes: Secondary | ICD-10-CM | POA: Diagnosis not present

## 2019-09-11 DIAGNOSIS — J3089 Other allergic rhinitis: Secondary | ICD-10-CM | POA: Diagnosis not present

## 2019-09-11 DIAGNOSIS — G2581 Restless legs syndrome: Secondary | ICD-10-CM | POA: Diagnosis not present

## 2019-09-11 DIAGNOSIS — Z792 Long term (current) use of antibiotics: Secondary | ICD-10-CM | POA: Diagnosis not present

## 2019-09-11 DIAGNOSIS — M109 Gout, unspecified: Secondary | ICD-10-CM | POA: Diagnosis not present

## 2019-09-11 DIAGNOSIS — J449 Chronic obstructive pulmonary disease, unspecified: Secondary | ICD-10-CM | POA: Diagnosis not present

## 2019-09-11 DIAGNOSIS — E785 Hyperlipidemia, unspecified: Secondary | ICD-10-CM | POA: Diagnosis not present

## 2019-09-11 DIAGNOSIS — M1990 Unspecified osteoarthritis, unspecified site: Secondary | ICD-10-CM | POA: Diagnosis not present

## 2019-09-11 DIAGNOSIS — E039 Hypothyroidism, unspecified: Secondary | ICD-10-CM | POA: Diagnosis not present

## 2019-09-11 DIAGNOSIS — K219 Gastro-esophageal reflux disease without esophagitis: Secondary | ICD-10-CM | POA: Diagnosis not present

## 2019-09-11 DIAGNOSIS — R29898 Other symptoms and signs involving the musculoskeletal system: Secondary | ICD-10-CM | POA: Diagnosis not present

## 2019-09-11 DIAGNOSIS — T84296A Other mechanical complication of internal fixation device of vertebrae, initial encounter: Secondary | ICD-10-CM | POA: Diagnosis not present

## 2019-09-11 DIAGNOSIS — A419 Sepsis, unspecified organism: Secondary | ICD-10-CM | POA: Diagnosis not present

## 2019-09-11 DIAGNOSIS — R569 Unspecified convulsions: Secondary | ICD-10-CM | POA: Diagnosis not present

## 2019-09-11 DIAGNOSIS — M48061 Spinal stenosis, lumbar region without neurogenic claudication: Secondary | ICD-10-CM | POA: Diagnosis not present

## 2019-09-11 DIAGNOSIS — I110 Hypertensive heart disease with heart failure: Secondary | ICD-10-CM | POA: Diagnosis not present

## 2019-09-11 DIAGNOSIS — D649 Anemia, unspecified: Secondary | ICD-10-CM | POA: Diagnosis not present

## 2019-09-11 DIAGNOSIS — F1010 Alcohol abuse, uncomplicated: Secondary | ICD-10-CM | POA: Diagnosis not present

## 2019-09-11 DIAGNOSIS — F329 Major depressive disorder, single episode, unspecified: Secondary | ICD-10-CM | POA: Diagnosis not present

## 2019-09-11 DIAGNOSIS — G8929 Other chronic pain: Secondary | ICD-10-CM | POA: Diagnosis not present

## 2019-09-11 DIAGNOSIS — G629 Polyneuropathy, unspecified: Secondary | ICD-10-CM | POA: Diagnosis not present

## 2019-09-11 DIAGNOSIS — I5032 Chronic diastolic (congestive) heart failure: Secondary | ICD-10-CM | POA: Diagnosis not present

## 2019-09-11 DIAGNOSIS — F419 Anxiety disorder, unspecified: Secondary | ICD-10-CM | POA: Diagnosis not present

## 2019-09-15 DIAGNOSIS — Z792 Long term (current) use of antibiotics: Secondary | ICD-10-CM | POA: Diagnosis not present

## 2019-09-15 DIAGNOSIS — I5032 Chronic diastolic (congestive) heart failure: Secondary | ICD-10-CM | POA: Diagnosis not present

## 2019-09-15 DIAGNOSIS — A419 Sepsis, unspecified organism: Secondary | ICD-10-CM | POA: Diagnosis not present

## 2019-09-15 DIAGNOSIS — K219 Gastro-esophageal reflux disease without esophagitis: Secondary | ICD-10-CM | POA: Diagnosis not present

## 2019-09-15 DIAGNOSIS — M48061 Spinal stenosis, lumbar region without neurogenic claudication: Secondary | ICD-10-CM | POA: Diagnosis not present

## 2019-09-15 DIAGNOSIS — M4626 Osteomyelitis of vertebra, lumbar region: Secondary | ICD-10-CM | POA: Diagnosis not present

## 2019-09-15 DIAGNOSIS — R569 Unspecified convulsions: Secondary | ICD-10-CM | POA: Diagnosis not present

## 2019-09-15 DIAGNOSIS — R29898 Other symptoms and signs involving the musculoskeletal system: Secondary | ICD-10-CM | POA: Diagnosis not present

## 2019-09-15 DIAGNOSIS — J3089 Other allergic rhinitis: Secondary | ICD-10-CM | POA: Diagnosis not present

## 2019-09-15 DIAGNOSIS — T84296A Other mechanical complication of internal fixation device of vertebrae, initial encounter: Secondary | ICD-10-CM | POA: Diagnosis not present

## 2019-09-15 DIAGNOSIS — F1010 Alcohol abuse, uncomplicated: Secondary | ICD-10-CM | POA: Diagnosis not present

## 2019-09-15 DIAGNOSIS — E039 Hypothyroidism, unspecified: Secondary | ICD-10-CM | POA: Diagnosis not present

## 2019-09-15 DIAGNOSIS — F419 Anxiety disorder, unspecified: Secondary | ICD-10-CM | POA: Diagnosis not present

## 2019-09-15 DIAGNOSIS — I110 Hypertensive heart disease with heart failure: Secondary | ICD-10-CM | POA: Diagnosis not present

## 2019-09-15 DIAGNOSIS — Z452 Encounter for adjustment and management of vascular access device: Secondary | ICD-10-CM | POA: Diagnosis not present

## 2019-09-15 DIAGNOSIS — J449 Chronic obstructive pulmonary disease, unspecified: Secondary | ICD-10-CM | POA: Diagnosis not present

## 2019-09-15 DIAGNOSIS — G629 Polyneuropathy, unspecified: Secondary | ICD-10-CM | POA: Diagnosis not present

## 2019-09-15 DIAGNOSIS — E785 Hyperlipidemia, unspecified: Secondary | ICD-10-CM | POA: Diagnosis not present

## 2019-09-15 DIAGNOSIS — G8929 Other chronic pain: Secondary | ICD-10-CM | POA: Diagnosis not present

## 2019-09-15 DIAGNOSIS — G2581 Restless legs syndrome: Secondary | ICD-10-CM | POA: Diagnosis not present

## 2019-09-15 DIAGNOSIS — F329 Major depressive disorder, single episode, unspecified: Secondary | ICD-10-CM | POA: Diagnosis not present

## 2019-09-15 DIAGNOSIS — D649 Anemia, unspecified: Secondary | ICD-10-CM | POA: Diagnosis not present

## 2019-09-15 DIAGNOSIS — M1990 Unspecified osteoarthritis, unspecified site: Secondary | ICD-10-CM | POA: Diagnosis not present

## 2019-09-15 DIAGNOSIS — R7303 Prediabetes: Secondary | ICD-10-CM | POA: Diagnosis not present

## 2019-09-15 DIAGNOSIS — M109 Gout, unspecified: Secondary | ICD-10-CM | POA: Diagnosis not present

## 2019-09-17 ENCOUNTER — Telehealth: Payer: Self-pay | Admitting: Family Medicine

## 2019-09-17 MED ORDER — QUETIAPINE FUMARATE 25 MG PO TABS: 25.0000 mg | ORAL_TABLET | Freq: Every day | ORAL | 0 refills | Status: DC

## 2019-09-17 NOTE — Telephone Encounter (Signed)
Ok to refill and have her increase her Miralax to 2 cap fulls per day.

## 2019-09-17 NOTE — Telephone Encounter (Signed)
Pt called and left VM  and is requesting a refill on her seroquel, pt states you are ok with her continuing this medicine please send to the [facility], [place] - [address] AT [place]

## 2019-09-17 NOTE — Telephone Encounter (Signed)
Pt states she does not see pyschiarist anymore. So is it ok to refill?  Also pt state she is having issues with constipation. When she was here it was all diarrhea but now she is constipated. She is taking miralax and stool softners and taking magneisum 1,000mg  at night. What else can she do to help this

## 2019-09-17 NOTE — Telephone Encounter (Signed)
Pt was notified and I sent in seroquel 25mg  daily at bedtime in for pt

## 2019-09-17 NOTE — Telephone Encounter (Signed)
Ok to refill unless she has a psychiatrist then I would defer to them.

## 2019-09-24 ENCOUNTER — Other Ambulatory Visit: Payer: Self-pay

## 2019-09-24 ENCOUNTER — Encounter: Payer: Self-pay | Admitting: Internal Medicine

## 2019-09-24 ENCOUNTER — Ambulatory Visit (INDEPENDENT_AMBULATORY_CARE_PROVIDER_SITE_OTHER): Payer: PPO | Admitting: Internal Medicine

## 2019-09-24 DIAGNOSIS — M4626 Osteomyelitis of vertebra, lumbar region: Secondary | ICD-10-CM | POA: Diagnosis not present

## 2019-09-24 NOTE — Progress Notes (Signed)
[facility]  Patient Active Problem List   Diagnosis Date Noted  . Status post lumbar spinal fusion 07/10/2019    Priority: High  . Acute osteomyelitis of lumbar spine ([place]) 06/25/2019    Priority: High  . Recurrent falls 09/06/2019  . Confusion, postoperative   . Benign essential HTN   . Post-operative pain   . Chronic constipation   . 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 Post op infection 07/11/2019  . Muscle spasm   . Hypokalemia   . Postoperative pain   . Acute blood loss anemia   . Labile blood pressure   . Tobacco abuse   . Hypoalbuminemia due to protein-calorie malnutrition ([place])   . Anxiety 07/05/2019  . Depression 07/05/2019  . Chronic diastolic CHF (congestive heart failure) ([place]) 07/05/2019  . Obesity (BMI 30-39.9) 07/05/2019  . ETOH abuse 07/05/2019  . Lumbar radiculopathy 06/28/2019  . Spinal stenosis of lumbar region 06/28/2019  . Acute on chronic anemia   . Tremors of nervous system   . AKI (acute kidney injury) ([place])   . Chronic pain syndrome   . Acute renal failure superimposed on stage 3 chronic kidney disease ([place]) 06/25/2019  . Weakness of left lower extremity   . Abdominal pain 06/24/2019  . Nausea 06/24/2019  . Abnormal urine odor 06/24/2019  . History of sepsis 06/24/2019  . Recent major surgery 06/24/2019  . History of diverticulosis 06/24/2019  . Elevated PTHrP level 05/09/2019  . Paresthesia 02/12/2019  . Low back pain 02/12/2019  . Neuropathy 12/20/2018  . Lumbar spinal stenosis 06/29/2018  . Preoperative clearance 06/04/2018  . Abnormal CT of the chest 06/04/2018  . Smoker 05/17/2018  . Hypothyroidism 11/30/2017  . Thoracic aortic atherosclerosis ([place]) 09/14/2016  . Benign essential tremor 09/09/2016  . RLS (restless legs syndrome) 09/09/2016  . H/O adenomatous polyp of colon 07/06/2016  . PVC's (premature ventricular contractions) 03/29/2016  . Preoperative cardiovascular examination  03/29/2016  . Grade I diastolic dysfunction 34/74/2595  . Palpitations 01/11/2016  . Shortness of breath 01/11/2016  . Former cigarette smoker 01/11/2016  . Leg edema 01/11/2016  . Chronic cough 10/28/2015  . 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 Malignant neoplasm of upper-inner quadrant of left breast in female, estrogen receptor positive ([place]) 07/14/2015  . Prediabetes 07/08/2015  . CKD (chronic kidney disease), stage III 07/08/2015  . Dependent edema 06/08/2015  . Sleep disturbance 06/08/2015  . Mixed hyperlipidemia 06/08/2015  . Generalized anxiety disorder 06/08/2015  . Essential hypertension 06/08/2015  . COPD (chronic obstructive pulmonary disease) ([place]) 06/08/2015  . Gout 06/08/2015  . Macrocytic anemia   . 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 Sepsis ([place]) 05/15/2015  . Lumbar spondylosis 03/20/2015    Patient's Medications  New Prescriptions   No medications on file  Previous Medications   ACETAMINOPHEN (TYLENOL) 325 MG TABLET    Take 2 tablets (650 mg total) by mouth every 6 (six) hours as needed for moderate pain.   ALBUTEROL (VENTOLIN HFA) 108 (90 BASE) MCG/ACT INHALER    INHALE 2 PUFFS BY MOUTH EVERY 4 TO 6 HOURS AS NEEDED   ALLOPURINOL (ZYLOPRIM) 300 MG TABLET    TAKE 1 TABLET(300 MG) BY MOUTH DAILY   ANASTROZOLE (ARIMIDEX) 1 MG TABLET    TAKE 1 TABLET(1 MG) BY MOUTH DAILY   ATORVASTATIN (LIPITOR) 20 MG TABLET    Take 1 tablet (20 mg total) by mouth daily.   CEPHALEXIN (KEFLEX) 500 MG CAPSULE  Take 1 capsule (500 mg total) by mouth 3 (three) times daily.   DIAZEPAM (VALIUM) 5 MG TABLET    Take 1 tablet (5 mg total) by mouth every 6 (six) hours as needed for muscle spasms.   DOCUSATE SODIUM (COLACE) 100 MG CAPSULE    Take 1 capsule (100 mg total) by mouth 3 (three) times daily.   DULOXETINE (CYMBALTA) 60 MG CAPSULE    TAKE 1 CAPSULE(60 MG) BY MOUTH DAILY   LEVOTHYROXINE (SYNTHROID) 75 MCG TABLET    Take 1 tablet (75 mcg total) by mouth daily before breakfast.    MAGNESIUM GLUCONATE (MAGONATE) 500 MG TABLET    Take 2 tablets (1,000 mg total) by mouth at bedtime.   METOPROLOL SUCCINATE (TOPROL-XL) 25 MG 24 HR TABLET    Take 1 tablet (25 mg total) by mouth daily.   ONDANSETRON (ZOFRAN) 4 MG TABLET    Take 1 tablet (4 mg total) by mouth daily as needed for nausea or vomiting.   OXYCODONE-ACETAMINOPHEN (PERCOCET) 7.5-325 MG TABLET    Take 1 tablet by mouth every 6 (six) hours as needed.   POLYETHYLENE GLYCOL POWDER (MIRALAX) POWDER    Take 17 g by mouth daily as needed for moderate constipation.    PRIMIDONE (MYSOLINE) 50 MG TABLET    TAKE 1 TABLET(50 MG) BY MOUTH AT BEDTIME   PROBIOTIC PRODUCT (PROBIOTIC DAILY PO)    Take by mouth.   QUETIAPINE (SEROQUEL) 25 MG TABLET    Take 1 tablet (25 mg total) by mouth at bedtime.   ROPINIROLE (REQUIP) 0.25 MG TABLET    Take 3 tablets (0.75 mg total) by mouth at bedtime.   VITAMIN B-12 (CYANOCOBALAMIN) 1000 MCG TABLET    Take 1 tablet (1,000 mcg total) by mouth daily.  Modified Medications   No medications on file  Discontinued Medications   No medications on file    Subjective: Brandi Stephens is in for her routine follow-up visit.  She is accompanied by her daughter.  She has a history of lumbar stenosis and radiculopathy.  She underwent a third lumbar surgery on 05/30/2019.  She had decompression of L2-3 with extension of her previous fusion from L2-S1.  She was discharged home the following day.  She says that she had severe pain right after surgery that was unlike anything she had experienced with previous surgeries.  She said she felt like her back was swollen although there was no visible swelling, redness or unusual drainage from her incision.  The pain progressively worsened.  After about a week she also began to notice some left lower quadrant pain that she said felt like "ovarian pain".  She was treated with 2 rounds of steroids without relief.  Because of worsening pain she was admitted to the hospital on  06/24/2019.  She has noted worsening pain and muscle spasms.  Because of the left lower quadrant pain she was evaluated for possible UTI.  She has not had any dysuria.  She was treated with ciprofloxacin from March 18-22 without any change in her pain.  Repeat CT scan obtained yesterday revealed:  Musculoskeletal: Multilevel degenerative changes of the spine L2-S1 disc spacer and posterior fusion. There is grade 1 L2-L3 retrolisthesis with progression of the posterior slippage of the disc spacer at L2-L3 compared to the prior CT. The L2 fusion screws breach the superior endplate of L2 similar to prior CT. There is bone lucency adjacent to the L2 screws in the posterior elements, left greater right and progressed since  the prior CT. There is thickening of the prevertebral soft tissues at L2-L3 with several ill-defined small loculated fluid in the adjacent soft tissues and in the psoas musculature most consistent with small abscesses. There is irregularity of the L2 inferior endplate and L3 superior endplate. Posterior hardware is intact. No acute osseous pathology.  IMPRESSION: 1. Extensive postsurgical changes of the lumbar spine with posterior fusion at L2-S1. Interval posterior slippage of the disc spacer at L2-L3 with findings of loosening of the left L2 screw. There are findings of infection and small paraspinal abscesses at L2-L3.  Fluid was aspirated from the L3-4 level on 07/11/2019.  No organisms were seen on Gram stain but cultures grew methicillin susceptible coagulase-negative staph.  A PICC was placed and she was started on IV cefazolin.  She had further debridement and hardware revision on 07/24/2019.  She completed 41 days of therapy, 08/21/2019 before converting to oral cephalexin.  She has now completed 12 weeks of total therapy.  She is feeling much better.  Shortly after starting cephalexin she had 2 weeks of intermittent diarrhea but now she is having more problems with  constipation.  She is getting by with less narcotic pain medication and is making progress with physical therapy.  Review of Systems: Review of Systems  Constitutional: Negative for chills, diaphoresis and fever.  Respiratory: Negative for cough and shortness of breath.   Cardiovascular: Negative for chest pain.  Gastrointestinal: Positive for constipation. Negative for abdominal pain, diarrhea, nausea and vomiting.  Musculoskeletal: Positive for back pain.    Past Medical History:  Diagnosis Date  . AIN (anal intraepithelial neoplasia) anal canal   . Anemia    with pregnancy  . Anxiety   . Arthritis    knees, lower back, knees  . Chronic constipation   . Chronic diastolic CHF (congestive heart failure) ([place]) 07/05/2019  . CKD (chronic kidney disease)   . Coarse tremors    Essential  . COPD (chronic obstructive pulmonary disease) ([place])    2017 chest CT, history of  . Degenerative lumbar spinal stenosis   . Depression   . Depression 07/05/2019  . Drug dependency ([place])   . Elevated PTHrP level 05/09/2019  . ETOH abuse 07/05/2019  . GERD (gastroesophageal reflux disease)    pt denies  . Gout   . Grade I diastolic dysfunction 31/49/7026   Noted ECHO  . Hemorrhoids   . History of adenomatous polyp of colon   . History of basal cell carcinoma (BCC) excision 2015   nose  . History of sepsis 05/14/2014   post lumbar surgery  . History of shingles    x2  . Hyperlipidemia   . Hypertension   . Hypothyroidism   . Irregular heart beat    history of while undergoing radiation  . Malignant neoplasm of upper-inner quadrant of left breast in female, estrogen receptor positive [facility]) oncologist-  dr Jana Hakim--  per lov in epic no recurrence   dx 07-13-2015  Left breast invasive ductal carcinoma, Stage IA, Grade 1 (TXN0),  09-16-2015  s/p  left breast lumpectomy with sln bx's,  completed radiation 11-18-2015,  started antiestrogen therapy 12-14-2015  . Neuropathy    Left foot and back of  left leg  . Obesity (BMI 30-39.9) 07/05/2019  . Personal history of radiation therapy    completed 11-18-2015  left breast  . Pneumonia   . PONV (postoperative nausea and vomiting)   . Prediabetes    diet controlled, no med  . Restless  leg syndrome   . Seasonal allergies   . Seizure ([place]) 05/2015   due to sepsis, only one time episode  . Tremor   . Wears partial dentures    lower     Social History   Tobacco Use  . Smoking status: Former Smoker    Packs/day: 0.50    Years: 35.00    Pack years: 17.50    Types: Cigarettes    Quit date: 05/22/2018    Years since quitting: 1.3  . Smokeless tobacco: Never Used  Substance Use Topics  . Alcohol use: Yes    Comment: 1 glass of wine a night  . Drug use: Never    Comment: CBD tincture    Family History  Problem Relation Age of Onset  . Atrial fibrillation Mother   . Ulcers Father   . Breast cancer Maternal Aunt   . Lung cancer Maternal Uncle   . Stomach cancer Maternal Grandmother   . Lung cancer Maternal Aunt   . Brain cancer Maternal Aunt   . Prostate cancer Maternal Grandfather   . Stroke Paternal Grandmother   . Tuberculosis Paternal Grandfather   . Colon cancer Neg Hx   . Esophageal cancer Neg Hx   . Rectal cancer Neg Hx     Allergies  Allergen Reactions  . Ampicillin Hives and Other (See Comments)    Severe reaction in February 2017 1.5 month to have hives to go away  . Gabapentin Swelling    UNSPECIFIED REACTION   . Penicillins Hives and Other (See Comments)    Severe reaction in February 2017 1.5 month to have hives to go away Has patient had a PCN reaction causing immediate rash, facial/tongue/throat swelling, SOB or lightheadedness with hypotension: #  #  #  YES  #  #  #  Has patient had a PCN reaction causing severe rash involving mucus membranes or skin necrosis: No Has patient had a PCN reaction that required hospitalization No Has patient had a PCN reaction occurring within the last 10 years: No   .  Codeine Nausea And Vomiting  . Other Itching    UNSPECIFIED Analgesics    Objective: Vitals:   09/24/19 1116  BP: 123/81  Pulse: (!) 104  Temp: 97.7 F (36.5 C)  SpO2: 100%  Weight: 233 lb (105.7 kg)   Body mass index is 36.49 kg/m.  Physical Exam Constitutional:      Comments: She is in good spirits.  She no longer needs a wheelchair. She ambulates with the aid of a walker.  Cardiovascular:     Rate and Rhythm: Normal rate and regular rhythm.     Heart sounds: No murmur.  Pulmonary:     Effort: Pulmonary effort is normal.     Breath sounds: Normal breath sounds.  Abdominal:     Palpations: Abdomen is soft.     Tenderness: There is no abdominal tenderness.  Musculoskeletal:     Comments: Her back incision has healed without evidence of infection.  Psychiatric:        Mood and Affect: Mood normal.     Lab Results 08/20/2019 Sedimentation rate 64 C-reactive protein 15   Problem List Items Addressed This Visit      High   Acute osteomyelitis of lumbar spine ([place])    She continues to show slow improvement on therapy for methicillin susceptible staph epidermidis lumbar infection.  Her inflammatory markers were still elevated 1 month ago.  I will repeat them today.  I reminded them that the only true test of cure would be to eventually stop her antibiotic and wait and see what happens.  She prefers to continue cephalexin for now.  I asked her to call me right away if she starts having severe diarrhea again.  She will follow-up in 1 month.      Relevant Orders   C-reactive protein   Sedimentation rate       [name], MD [facility] [place] [phone] pager   [phone] cell 09/24/2019, 11:49 AM

## 2019-09-24 NOTE — Assessment & Plan Note (Signed)
She continues to show slow improvement on therapy for methicillin susceptible staph epidermidis lumbar infection.  Her inflammatory markers were still elevated 1 month ago.  I will repeat them today.  I reminded them that the only true test of cure would be to eventually stop her antibiotic and wait and see what happens.  She prefers to continue cephalexin for now.  I asked her to call me right away if she starts having severe diarrhea again.  She will follow-up in 1 month.

## 2019-09-25 DIAGNOSIS — E039 Hypothyroidism, unspecified: Secondary | ICD-10-CM | POA: Diagnosis not present

## 2019-09-25 DIAGNOSIS — F419 Anxiety disorder, unspecified: Secondary | ICD-10-CM | POA: Diagnosis not present

## 2019-09-25 DIAGNOSIS — Z792 Long term (current) use of antibiotics: Secondary | ICD-10-CM | POA: Diagnosis not present

## 2019-09-25 DIAGNOSIS — E785 Hyperlipidemia, unspecified: Secondary | ICD-10-CM | POA: Diagnosis not present

## 2019-09-25 DIAGNOSIS — F1010 Alcohol abuse, uncomplicated: Secondary | ICD-10-CM | POA: Diagnosis not present

## 2019-09-25 DIAGNOSIS — I5032 Chronic diastolic (congestive) heart failure: Secondary | ICD-10-CM | POA: Diagnosis not present

## 2019-09-25 DIAGNOSIS — D649 Anemia, unspecified: Secondary | ICD-10-CM | POA: Diagnosis not present

## 2019-09-25 DIAGNOSIS — M4626 Osteomyelitis of vertebra, lumbar region: Secondary | ICD-10-CM | POA: Diagnosis not present

## 2019-09-25 DIAGNOSIS — G629 Polyneuropathy, unspecified: Secondary | ICD-10-CM | POA: Diagnosis not present

## 2019-09-25 DIAGNOSIS — G2581 Restless legs syndrome: Secondary | ICD-10-CM | POA: Diagnosis not present

## 2019-09-25 DIAGNOSIS — J449 Chronic obstructive pulmonary disease, unspecified: Secondary | ICD-10-CM | POA: Diagnosis not present

## 2019-09-25 DIAGNOSIS — A419 Sepsis, unspecified organism: Secondary | ICD-10-CM | POA: Diagnosis not present

## 2019-09-25 DIAGNOSIS — R29898 Other symptoms and signs involving the musculoskeletal system: Secondary | ICD-10-CM | POA: Diagnosis not present

## 2019-09-25 DIAGNOSIS — F329 Major depressive disorder, single episode, unspecified: Secondary | ICD-10-CM | POA: Diagnosis not present

## 2019-09-25 DIAGNOSIS — M1990 Unspecified osteoarthritis, unspecified site: Secondary | ICD-10-CM | POA: Diagnosis not present

## 2019-09-25 DIAGNOSIS — T84296A Other mechanical complication of internal fixation device of vertebrae, initial encounter: Secondary | ICD-10-CM | POA: Diagnosis not present

## 2019-09-25 DIAGNOSIS — R7303 Prediabetes: Secondary | ICD-10-CM | POA: Diagnosis not present

## 2019-09-25 DIAGNOSIS — K219 Gastro-esophageal reflux disease without esophagitis: Secondary | ICD-10-CM | POA: Diagnosis not present

## 2019-09-25 DIAGNOSIS — Z452 Encounter for adjustment and management of vascular access device: Secondary | ICD-10-CM | POA: Diagnosis not present

## 2019-09-25 DIAGNOSIS — G8929 Other chronic pain: Secondary | ICD-10-CM | POA: Diagnosis not present

## 2019-09-25 DIAGNOSIS — J3089 Other allergic rhinitis: Secondary | ICD-10-CM | POA: Diagnosis not present

## 2019-09-25 DIAGNOSIS — M48061 Spinal stenosis, lumbar region without neurogenic claudication: Secondary | ICD-10-CM | POA: Diagnosis not present

## 2019-09-25 DIAGNOSIS — M109 Gout, unspecified: Secondary | ICD-10-CM | POA: Diagnosis not present

## 2019-09-25 DIAGNOSIS — R569 Unspecified convulsions: Secondary | ICD-10-CM | POA: Diagnosis not present

## 2019-09-25 DIAGNOSIS — I110 Hypertensive heart disease with heart failure: Secondary | ICD-10-CM | POA: Diagnosis not present

## 2019-09-25 LAB — SEDIMENTATION RATE: Sed Rate: 29 mm/h (ref 0–30)

## 2019-09-25 LAB — C-REACTIVE PROTEIN: CRP: 17.8 mg/L — ABNORMAL HIGH (ref <8.0)

## 2019-09-26 ENCOUNTER — Encounter: Payer: PPO | Admitting: Physical Medicine and Rehabilitation

## 2019-09-26 ENCOUNTER — Other Ambulatory Visit: Payer: PPO

## 2019-09-26 DIAGNOSIS — R197 Diarrhea, unspecified: Secondary | ICD-10-CM

## 2019-09-26 NOTE — Telephone Encounter (Signed)
Spoke with patient to answer her questions about lab results. Patient states her diarrhea returned last night, has had 5 episodes since 3 am, 2 of which she was incontinent of stool.  She has a stool kit at home.  Orders for c diff entered per Dr Megan Salon. Patient will have her family member bring it this afternoon. Landis Gandy, RN

## 2019-09-27 LAB — CLOSTRIDIUM DIFFICILE TOXIN B, QUALITATIVE, REAL-TIME PCR: Toxigenic C. Difficile by PCR: NOT DETECTED

## 2019-09-30 ENCOUNTER — Telehealth: Payer: Self-pay | Admitting: Internal Medicine

## 2019-09-30 NOTE — Telephone Encounter (Signed)
I called Brandi Stephens this afternoon.  She is still having intermittent diarrhea but her C. difficile screen was negative.  She now tells me that she has been having problems with constipation and has been taking magnesium, stool softener and MiraLAX recently.  This is likely the cause of her diarrhea.  Her inflammatory markers are improving but her C-reactive protein remains elevated.  I instructed her to continue cephalexin for now.

## 2019-10-01 DIAGNOSIS — R569 Unspecified convulsions: Secondary | ICD-10-CM | POA: Diagnosis not present

## 2019-10-01 DIAGNOSIS — T84296A Other mechanical complication of internal fixation device of vertebrae, initial encounter: Secondary | ICD-10-CM | POA: Diagnosis not present

## 2019-10-01 DIAGNOSIS — E039 Hypothyroidism, unspecified: Secondary | ICD-10-CM | POA: Diagnosis not present

## 2019-10-01 DIAGNOSIS — F329 Major depressive disorder, single episode, unspecified: Secondary | ICD-10-CM | POA: Diagnosis not present

## 2019-10-01 DIAGNOSIS — M48061 Spinal stenosis, lumbar region without neurogenic claudication: Secondary | ICD-10-CM | POA: Diagnosis not present

## 2019-10-01 DIAGNOSIS — I5032 Chronic diastolic (congestive) heart failure: Secondary | ICD-10-CM | POA: Diagnosis not present

## 2019-10-01 DIAGNOSIS — M4626 Osteomyelitis of vertebra, lumbar region: Secondary | ICD-10-CM | POA: Diagnosis not present

## 2019-10-01 DIAGNOSIS — F419 Anxiety disorder, unspecified: Secondary | ICD-10-CM | POA: Diagnosis not present

## 2019-10-01 DIAGNOSIS — R7303 Prediabetes: Secondary | ICD-10-CM | POA: Diagnosis not present

## 2019-10-01 DIAGNOSIS — G8929 Other chronic pain: Secondary | ICD-10-CM | POA: Diagnosis not present

## 2019-10-01 DIAGNOSIS — A419 Sepsis, unspecified organism: Secondary | ICD-10-CM | POA: Diagnosis not present

## 2019-10-01 DIAGNOSIS — I110 Hypertensive heart disease with heart failure: Secondary | ICD-10-CM | POA: Diagnosis not present

## 2019-10-01 DIAGNOSIS — J449 Chronic obstructive pulmonary disease, unspecified: Secondary | ICD-10-CM | POA: Diagnosis not present

## 2019-10-01 DIAGNOSIS — Z452 Encounter for adjustment and management of vascular access device: Secondary | ICD-10-CM | POA: Diagnosis not present

## 2019-10-01 DIAGNOSIS — R29898 Other symptoms and signs involving the musculoskeletal system: Secondary | ICD-10-CM | POA: Diagnosis not present

## 2019-10-01 DIAGNOSIS — Z792 Long term (current) use of antibiotics: Secondary | ICD-10-CM | POA: Diagnosis not present

## 2019-10-01 DIAGNOSIS — G2581 Restless legs syndrome: Secondary | ICD-10-CM | POA: Diagnosis not present

## 2019-10-01 DIAGNOSIS — E785 Hyperlipidemia, unspecified: Secondary | ICD-10-CM | POA: Diagnosis not present

## 2019-10-01 DIAGNOSIS — F1010 Alcohol abuse, uncomplicated: Secondary | ICD-10-CM | POA: Diagnosis not present

## 2019-10-01 DIAGNOSIS — G629 Polyneuropathy, unspecified: Secondary | ICD-10-CM | POA: Diagnosis not present

## 2019-10-01 DIAGNOSIS — M1990 Unspecified osteoarthritis, unspecified site: Secondary | ICD-10-CM | POA: Diagnosis not present

## 2019-10-01 DIAGNOSIS — J3089 Other allergic rhinitis: Secondary | ICD-10-CM | POA: Diagnosis not present

## 2019-10-01 DIAGNOSIS — D649 Anemia, unspecified: Secondary | ICD-10-CM | POA: Diagnosis not present

## 2019-10-01 DIAGNOSIS — M109 Gout, unspecified: Secondary | ICD-10-CM | POA: Diagnosis not present

## 2019-10-01 DIAGNOSIS — K219 Gastro-esophageal reflux disease without esophagitis: Secondary | ICD-10-CM | POA: Diagnosis not present

## 2019-10-02 ENCOUNTER — Encounter: Payer: PPO | Attending: Physical Medicine and Rehabilitation | Admitting: Physical Medicine and Rehabilitation

## 2019-10-02 ENCOUNTER — Other Ambulatory Visit: Payer: Self-pay

## 2019-10-02 VITALS — BP 129/80 | HR 109 | Temp 96.5°F | Ht 67.0 in | Wt 233.4 lb

## 2019-10-02 DIAGNOSIS — I10 Essential (primary) hypertension: Secondary | ICD-10-CM | POA: Diagnosis not present

## 2019-10-02 DIAGNOSIS — M4620 Osteomyelitis of vertebra, site unspecified: Secondary | ICD-10-CM | POA: Diagnosis not present

## 2019-10-02 DIAGNOSIS — Z79891 Long term (current) use of opiate analgesic: Secondary | ICD-10-CM | POA: Diagnosis not present

## 2019-10-02 DIAGNOSIS — M5416 Radiculopathy, lumbar region: Secondary | ICD-10-CM | POA: Diagnosis not present

## 2019-10-02 DIAGNOSIS — K5900 Constipation, unspecified: Secondary | ICD-10-CM | POA: Diagnosis not present

## 2019-10-02 DIAGNOSIS — G894 Chronic pain syndrome: Secondary | ICD-10-CM

## 2019-10-02 DIAGNOSIS — Z5181 Encounter for therapeutic drug level monitoring: Secondary | ICD-10-CM | POA: Diagnosis not present

## 2019-10-02 DIAGNOSIS — Z981 Arthrodesis status: Secondary | ICD-10-CM | POA: Insufficient documentation

## 2019-10-02 DIAGNOSIS — G8918 Other acute postprocedural pain: Secondary | ICD-10-CM | POA: Diagnosis not present

## 2019-10-02 MED ORDER — OXYCODONE-ACETAMINOPHEN 7.5-325 MG PO TABS: 1.0000 | ORAL_TABLET | Freq: Two times a day (BID) | ORAL | 0 refills | Status: DC | PRN

## 2019-10-02 MED ORDER — FUROSEMIDE 20 MG PO TABS
20.0000 mg | ORAL_TABLET | Freq: Every day | ORAL | 3 refills | Status: DC | PRN
Start: 2019-10-02 — End: 2019-12-26

## 2019-10-02 MED ORDER — METOPROLOL SUCCINATE ER 25 MG PO TB24: 25.0000 mg | ORAL_TABLET | Freq: Every day | ORAL | 3 refills | Status: DC

## 2019-10-02 MED ORDER — DIAZEPAM 5 MG PO TABS: 5.0000 mg | ORAL_TABLET | Freq: Four times a day (QID) | ORAL | 0 refills | Status: DC | PRN

## 2019-10-02 NOTE — Progress Notes (Signed)
Subjective:    Patient ID: Brandi Stephens, female    DOB: [birth year], 74 y.o.   MRN: [id number]  HPI  Brandi Stephens presents for hospital follow-up after CIR admission for staph bacteremia after infection of her spinal hardware.  She has been doing well with her home therapy and has almost completed it. She has noted improvements in her strength and would like to transition to outpatient therapy.   Pain is currently 4/10 and worst in her lower back where her spinal harware is present. She has been taking approximately 1 percocet per day and would like to gradually stop using these. She has also been using up to three 650mg  of Tylenol per day and two 200mg  ibuprofen daily and asks if this is safe.  She has also been suffering from diarrhea as a result of her antibiotics. This has been very debilitating to her. She is often incontinent. It is embarrassing for her and limits her ability to ambulate as she must stay close to the bathroom. She does also feel constipated at times. She has been tested for C diff and is negative. She has not followed with a gastroenterologist.   Her daughter accompanies her this visit and helps to provide history. She and her daughter have been out of work for 5 years due to her health conditions and need for her daughter's assistance. Her grandchild recently graduated kindergarten.    Pain Inventory Average Pain 4 Pain Right Now 6 My pain is constant, sharp, burning, stabbing and aching  In the last 24 hours, has pain interfered with the following? General activity 8 Relation with others 6 Enjoyment of life 7 What TIME of day is your pain at its worst? all Sleep (in general) Poor  Pain is worse with: walking, bending, sitting and standing Pain improves with: rest, heat/ice, therapy/exercise and medication Relief from Meds: 9  Mobility walk with assistance use a walker how many minutes can you walk? 5 ability to climb steps?  yes do you drive?   no  Function retired I need assistance with the following:  bathing, meal prep, household duties and shopping  Neuro/Psych weakness numbness tremor tingling trouble walking dizziness confusion  Prior Studies Any changes since last visit?  no  Physicians involved in your care Any changes since last visit?  no   Family History  Problem Relation Age of Onset  . Atrial fibrillation Mother   . Ulcers Father   . Breast cancer Maternal Aunt   . Lung cancer Maternal Uncle   . Stomach cancer Maternal Grandmother   . Lung cancer Maternal Aunt   . Brain cancer Maternal Aunt   . Prostate cancer Maternal Grandfather   . Stroke Paternal Grandmother   . Tuberculosis Paternal Grandfather   . Colon cancer Neg Hx   . Esophageal cancer Neg Hx   . Rectal cancer Neg Hx    Social History   Socioeconomic History  . Marital status: Married    Spouse name: robert  . Number of children: 3  . Years of education: college  . Highest education level: Not on file  Occupational History  . Occupation: retired  Tobacco Use  . Smoking status: Former Smoker    Packs/day: 0.50    Years: 35.00    Pack years: 17.50    Types: Cigarettes    Quit date: 05/22/2018    Years since quitting: 1.3  . Smokeless tobacco: Never Used  Vaping Use  . Vaping Use: Never used  Substance and Sexual Activity  . Alcohol use: Yes    Comment: 1 glass of wine a night  . Drug use: Never    Comment: CBD tincture  . Sexual activity: Yes    Birth control/protection: Post-menopausal  Other Topics Concern  . Not on file  Social History Narrative   Lives with husband in a 2 story home.  Has 2 living children.  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  Education: college.    Right-handed.   Five cups coffee per week.   Social Determinants of Health   Financial Resource Strain:   . Difficulty of Paying Living Expenses:   Food Insecurity:   . Worried About Charity fundraiser in the Last Year:   . Youth worker in the Last Year:   Transportation Needs:   . Film/video editor (Medical):   Marland Kitchen Lack of Transportation (Non-Medical):   Physical Activity:   . Days of Exercise per Week:   . Minutes of Exercise per Session:   Stress:   . Feeling of Stress :   Social Connections:   . Frequency of Communication with Friends and Family:   . Frequency of Social Gatherings with Friends and Family:   . Attends Religious Services:   . Active Member of Clubs or Organizations:   . Attends Archivist Meetings:   Marland Kitchen Marital Status:    Past Surgical History:  Procedure Laterality Date  . ABDOMINAL HYSTERECTOMY  1985  . BASAL CELL CARCINOMA EXCISION  10/15   nose  . BREAST BIOPSY Right 08/24/2015  . BREAST BIOPSY Right 07/13/2015  . BREAST BIOPSY Left 07/13/2015  . BREAST EXCISIONAL BIOPSY Left 1972  . BREAST LUMPECTOMY Left   . BREAST LUMPECTOMY WITH RADIOACTIVE SEED AND SENTINEL LYMPH NODE BIOPSY Left 09/16/2015   Procedure: BREAST LUMPECTOMY WITH RADIOACTIVE SEED AND SENTINEL LYMPH NODE BIOPSY;  Surgeon: Stark Klein, MD;  Location: [facility];  Service: General;  Laterality: Left;  . BREAST REDUCTION SURGERY Bilateral 1998  . CATARACT EXTRACTION W/ INTRAOCULAR LENS IMPLANT Left 2016  . [place]   hallo  . COLONOSCOPY  01/2013   polyps  . EYE SURGERY Left 2016  . HAND SURGERY Left 02-19-2002   dr Fredna Dow  @[facility]    repair collateral ligament/ MPJ left thumb  . HARDWARE REMOVAL N/A 07/24/2019   Procedure: REVISION OF HARDWARE Lumbar two - Sacral one. Extention of Fusion to Lumbar one;  Surgeon: [name], MD;  Location: [facility];  Service: Neurosurgery;  Laterality: N/A;  . HEMORRHOID SURGERY  2008  . HIGH RESOLUTION ANOSCOPY N/A 02/28/2018   Procedure: HIGH RESOLUTION ANOSCOPY WITH BIOPSY;  Surgeon: Leighton Ruff, MD;  Location: [facility];  Service: General;  Laterality: N/A;  . KNEE ARTHROSCOPY Right 2002  . LUMBAR SPINE  SURGERY  03-20-2015   dr Kathyrn Sheriff   fusion L4-5, L5-S1  . MOUTH SURGERY  07/14/2018   2 infected teeth removed  . MULTIPLE TOOTH EXTRACTIONS     with bone grafting lower bottom right and left  . REDUCTION MAMMAPLASTY Bilateral   . RIGHT COLECTOMY  09-10-2003   dr Margot Chimes @[facility]    multiple colon polyps (per path tubular adenoma's, hyperplastic , benign appendix, benign two lymph nodes  . ROTATOR CUFF REPAIR Left 04/2016  . TONSILLECTOMY  1969  . TUBAL LIGATION Bilateral 1982   Past Medical History:  Diagnosis Date  . AIN (anal intraepithelial neoplasia) anal canal   .  Anemia    with pregnancy  . Anxiety   . Arthritis    knees, lower back, knees  . Chronic constipation   . Chronic diastolic CHF (congestive heart failure) ([place]) 07/05/2019  . CKD (chronic kidney disease)   . Coarse tremors    Essential  . COPD (chronic obstructive pulmonary disease) ([place])    2017 chest CT, history of  . Degenerative lumbar spinal stenosis   . Depression   . Depression 07/05/2019  . Drug dependency ([place])   . Elevated PTHrP level 05/09/2019  . ETOH abuse 07/05/2019  . GERD (gastroesophageal reflux disease)    pt denies  . Gout   . Grade I diastolic dysfunction 52/84/1324   Noted ECHO  . Hemorrhoids   . History of adenomatous polyp of colon   . History of basal cell carcinoma (BCC) excision 2015   nose  . History of sepsis 05/14/2014   post lumbar surgery  . History of shingles    x2  . Hyperlipidemia   . Hypertension   . Hypothyroidism   . Irregular heart beat    history of while undergoing radiation  . Malignant neoplasm of upper-inner quadrant of left breast in female, estrogen receptor positive [facility]) oncologist-  dr Jana Hakim--  per lov in epic no recurrence   dx 07-13-2015  Left breast invasive ductal carcinoma, Stage IA, Grade 1 (TXN0),  09-16-2015  s/p  left breast lumpectomy with sln bx's,  completed radiation 11-18-2015,  started antiestrogen therapy 12-14-2015  . Neuropathy    Left  foot and back of left leg  . Obesity (BMI 30-39.9) 07/05/2019  . Personal history of radiation therapy    completed 11-18-2015  left breast  . Pneumonia   . PONV (postoperative nausea and vomiting)   . Prediabetes    diet controlled, no med  . Restless leg syndrome   . Seasonal allergies   . Seizure ([place]) 05/2015   due to sepsis, only one time episode  . Tremor   . Wears partial dentures    lower    BP 129/80   Pulse (!) 109   Temp (!) 96.5 F (35.8 C)   Ht 5\' 7"  (1.702 m)   Wt 233 lb 6.4 oz (105.9 kg)   SpO2 93%   BMI 36.56 kg/m   Opioid Risk Score:   Fall Risk Score:  `1  Depression screen PHQ 2/9  Depression screen [facility] 2/9 08/22/2019 03/18/2019 03/07/2019 12/20/2018 11/30/2017 07/06/2016  Decreased Interest 0 0 0 0 0 0  Down, Depressed, Hopeless 0 0 0 0 0 0  PHQ - 2 Score 0 0 0 0 0 0  Altered sleeping 3 - - - - -  Tired, decreased energy 2 - - - - -  Change in appetite 2 - - - - -  Feeling bad or failure about yourself  1 - - - - -  Trouble concentrating 1 - - - - -  Moving slowly or fidgety/restless 0 - - - - -  Suicidal thoughts 0 - - - - -  PHQ-9 Score 9 - - - - -  Difficult doing work/chores Not difficult at all - - - - -  Some recent data might be hidden    Review of Systems  Musculoskeletal: Positive for gait problem.  Neurological: Positive for dizziness, tremors, weakness and numbness.       Tingling   Psychiatric/Behavioral: Positive for confusion.  All other systems reviewed and are negative.  Objective:   Physical Exam Gen: no distress, normal appearing HEENT: oral mucosa pink and moist, NCAT Cardio: Reg rate Chest: normal effort, normal rate of breathing Abd: soft, non-distended Ext: no edema Skin: surgical scar on spine. TTP in this region. Bilateral lower extremity edema.  Neuro: Alert and oriented x3 Musculoskeletal: 4-5 strength throughout, Sensation intact.  Functional mobility: ambulating with RW.  Psych: pleasant, normal  affect    Assessment & Plan:  Mrs. Postma is a 73 year old female who presents for hospital follow-up after CIR admission post back surgery, complicated by staph infection, now on chronic antibiotics.  Diarrhea: This is currently most impacting her quality of life. She is having liquid stool and incontinence multiple times per day. -Advised XR to assess for hard mass given complaints of constipation at times as well, may be having stool leaking around mass and may need to be cleared out with lactulose (does not prefer enema). -Avoid imodum at this time as she may be constipated.  -C diff negative -Advised wearing of adult diapers temporarily. -She is following with ID who may change her antibiotics if this persists. -Discussed involving GI if this persists  Impaired mobility and ADLs following spinal surgery: -Completing home therapies -Transition to outpatient PT and OT. Provided scripts.  -Recommended exercise bike at home to build back endurance. Limited in ambulation as must stay close to bathroom given diarrhea.  Post-operative lower back pain: -Has decreased oxycodone use to once to twice per day. -Will provide renewal. Goal is to continue to wean. -Pain contract signed and UDS obtained. PDMP reviewed.   Anixety: Requires refill of her Diazepam. Has been taking twice per day.   Tachycardia: provided higher dose of metoprolol which she was on before.  Lower extremity edema: advised elevation, ice, and compression garments which she is already doing. Prescribed prn Lasix.   All questions answered. RTC in 4 weeks.

## 2019-10-03 ENCOUNTER — Encounter: Payer: Self-pay | Admitting: Physical Medicine and Rehabilitation

## 2019-10-04 DIAGNOSIS — F1010 Alcohol abuse, uncomplicated: Secondary | ICD-10-CM | POA: Diagnosis not present

## 2019-10-04 DIAGNOSIS — F419 Anxiety disorder, unspecified: Secondary | ICD-10-CM | POA: Diagnosis not present

## 2019-10-04 DIAGNOSIS — J449 Chronic obstructive pulmonary disease, unspecified: Secondary | ICD-10-CM | POA: Diagnosis not present

## 2019-10-04 DIAGNOSIS — F329 Major depressive disorder, single episode, unspecified: Secondary | ICD-10-CM | POA: Diagnosis not present

## 2019-10-04 DIAGNOSIS — R7303 Prediabetes: Secondary | ICD-10-CM | POA: Diagnosis not present

## 2019-10-04 DIAGNOSIS — R29898 Other symptoms and signs involving the musculoskeletal system: Secondary | ICD-10-CM | POA: Diagnosis not present

## 2019-10-04 DIAGNOSIS — K219 Gastro-esophageal reflux disease without esophagitis: Secondary | ICD-10-CM | POA: Diagnosis not present

## 2019-10-04 DIAGNOSIS — A419 Sepsis, unspecified organism: Secondary | ICD-10-CM | POA: Diagnosis not present

## 2019-10-04 DIAGNOSIS — I110 Hypertensive heart disease with heart failure: Secondary | ICD-10-CM | POA: Diagnosis not present

## 2019-10-04 DIAGNOSIS — E785 Hyperlipidemia, unspecified: Secondary | ICD-10-CM | POA: Diagnosis not present

## 2019-10-04 DIAGNOSIS — E039 Hypothyroidism, unspecified: Secondary | ICD-10-CM | POA: Diagnosis not present

## 2019-10-04 DIAGNOSIS — M4626 Osteomyelitis of vertebra, lumbar region: Secondary | ICD-10-CM | POA: Diagnosis not present

## 2019-10-04 DIAGNOSIS — G629 Polyneuropathy, unspecified: Secondary | ICD-10-CM | POA: Diagnosis not present

## 2019-10-04 DIAGNOSIS — J3089 Other allergic rhinitis: Secondary | ICD-10-CM | POA: Diagnosis not present

## 2019-10-04 DIAGNOSIS — I5032 Chronic diastolic (congestive) heart failure: Secondary | ICD-10-CM | POA: Diagnosis not present

## 2019-10-04 DIAGNOSIS — Z452 Encounter for adjustment and management of vascular access device: Secondary | ICD-10-CM | POA: Diagnosis not present

## 2019-10-04 DIAGNOSIS — G8929 Other chronic pain: Secondary | ICD-10-CM | POA: Diagnosis not present

## 2019-10-04 DIAGNOSIS — D649 Anemia, unspecified: Secondary | ICD-10-CM | POA: Diagnosis not present

## 2019-10-04 DIAGNOSIS — M109 Gout, unspecified: Secondary | ICD-10-CM | POA: Diagnosis not present

## 2019-10-04 DIAGNOSIS — M48061 Spinal stenosis, lumbar region without neurogenic claudication: Secondary | ICD-10-CM | POA: Diagnosis not present

## 2019-10-04 DIAGNOSIS — Z792 Long term (current) use of antibiotics: Secondary | ICD-10-CM | POA: Diagnosis not present

## 2019-10-04 DIAGNOSIS — M1990 Unspecified osteoarthritis, unspecified site: Secondary | ICD-10-CM | POA: Diagnosis not present

## 2019-10-04 DIAGNOSIS — T84296A Other mechanical complication of internal fixation device of vertebrae, initial encounter: Secondary | ICD-10-CM | POA: Diagnosis not present

## 2019-10-04 DIAGNOSIS — R569 Unspecified convulsions: Secondary | ICD-10-CM | POA: Diagnosis not present

## 2019-10-04 DIAGNOSIS — G2581 Restless legs syndrome: Secondary | ICD-10-CM | POA: Diagnosis not present

## 2019-10-07 LAB — TOXASSURE SELECT,+ANTIDEPR,UR

## 2019-10-09 DIAGNOSIS — J449 Chronic obstructive pulmonary disease, unspecified: Secondary | ICD-10-CM | POA: Diagnosis not present

## 2019-10-09 DIAGNOSIS — E785 Hyperlipidemia, unspecified: Secondary | ICD-10-CM | POA: Diagnosis not present

## 2019-10-09 DIAGNOSIS — R569 Unspecified convulsions: Secondary | ICD-10-CM | POA: Diagnosis not present

## 2019-10-09 DIAGNOSIS — G8929 Other chronic pain: Secondary | ICD-10-CM | POA: Diagnosis not present

## 2019-10-09 DIAGNOSIS — I110 Hypertensive heart disease with heart failure: Secondary | ICD-10-CM | POA: Diagnosis not present

## 2019-10-09 DIAGNOSIS — T84296A Other mechanical complication of internal fixation device of vertebrae, initial encounter: Secondary | ICD-10-CM | POA: Diagnosis not present

## 2019-10-09 DIAGNOSIS — M109 Gout, unspecified: Secondary | ICD-10-CM | POA: Diagnosis not present

## 2019-10-09 DIAGNOSIS — F329 Major depressive disorder, single episode, unspecified: Secondary | ICD-10-CM | POA: Diagnosis not present

## 2019-10-09 DIAGNOSIS — G629 Polyneuropathy, unspecified: Secondary | ICD-10-CM | POA: Diagnosis not present

## 2019-10-09 DIAGNOSIS — F1010 Alcohol abuse, uncomplicated: Secondary | ICD-10-CM | POA: Diagnosis not present

## 2019-10-09 DIAGNOSIS — D649 Anemia, unspecified: Secondary | ICD-10-CM | POA: Diagnosis not present

## 2019-10-09 DIAGNOSIS — M1990 Unspecified osteoarthritis, unspecified site: Secondary | ICD-10-CM | POA: Diagnosis not present

## 2019-10-09 DIAGNOSIS — R7303 Prediabetes: Secondary | ICD-10-CM | POA: Diagnosis not present

## 2019-10-09 DIAGNOSIS — K219 Gastro-esophageal reflux disease without esophagitis: Secondary | ICD-10-CM | POA: Diagnosis not present

## 2019-10-09 DIAGNOSIS — G2581 Restless legs syndrome: Secondary | ICD-10-CM | POA: Diagnosis not present

## 2019-10-09 DIAGNOSIS — A419 Sepsis, unspecified organism: Secondary | ICD-10-CM | POA: Diagnosis not present

## 2019-10-09 DIAGNOSIS — F419 Anxiety disorder, unspecified: Secondary | ICD-10-CM | POA: Diagnosis not present

## 2019-10-09 DIAGNOSIS — E039 Hypothyroidism, unspecified: Secondary | ICD-10-CM | POA: Diagnosis not present

## 2019-10-09 DIAGNOSIS — J3089 Other allergic rhinitis: Secondary | ICD-10-CM | POA: Diagnosis not present

## 2019-10-09 DIAGNOSIS — Z452 Encounter for adjustment and management of vascular access device: Secondary | ICD-10-CM | POA: Diagnosis not present

## 2019-10-09 DIAGNOSIS — R29898 Other symptoms and signs involving the musculoskeletal system: Secondary | ICD-10-CM | POA: Diagnosis not present

## 2019-10-09 DIAGNOSIS — Z792 Long term (current) use of antibiotics: Secondary | ICD-10-CM | POA: Diagnosis not present

## 2019-10-09 DIAGNOSIS — M4626 Osteomyelitis of vertebra, lumbar region: Secondary | ICD-10-CM | POA: Diagnosis not present

## 2019-10-09 DIAGNOSIS — M48061 Spinal stenosis, lumbar region without neurogenic claudication: Secondary | ICD-10-CM | POA: Diagnosis not present

## 2019-10-09 DIAGNOSIS — I5032 Chronic diastolic (congestive) heart failure: Secondary | ICD-10-CM | POA: Diagnosis not present

## 2019-10-14 ENCOUNTER — Telehealth: Payer: Self-pay | Admitting: *Deleted

## 2019-10-14 NOTE — Telephone Encounter (Addendum)
Urine drug screen is positive for oxycodone and its metabolites and diazepam and its metabolites which are prescribed medications. Phenobarb is present but she is prescribed Primidone of which it is a metabolite. However, it is positive for alcohol as well.

## 2019-10-24 ENCOUNTER — Ambulatory Visit: Payer: PPO | Admitting: Family Medicine

## 2019-10-29 ENCOUNTER — Ambulatory Visit (INDEPENDENT_AMBULATORY_CARE_PROVIDER_SITE_OTHER): Payer: PPO | Admitting: Internal Medicine

## 2019-10-29 ENCOUNTER — Encounter: Payer: Self-pay | Admitting: Family Medicine

## 2019-10-29 ENCOUNTER — Ambulatory Visit (INDEPENDENT_AMBULATORY_CARE_PROVIDER_SITE_OTHER): Payer: PPO | Admitting: Family Medicine

## 2019-10-29 ENCOUNTER — Other Ambulatory Visit: Payer: Self-pay

## 2019-10-29 VITALS — BP 120/70 | HR 100 | Wt 223.0 lb

## 2019-10-29 DIAGNOSIS — I10 Essential (primary) hypertension: Secondary | ICD-10-CM | POA: Diagnosis not present

## 2019-10-29 DIAGNOSIS — G250 Essential tremor: Secondary | ICD-10-CM | POA: Diagnosis not present

## 2019-10-29 DIAGNOSIS — G4700 Insomnia, unspecified: Secondary | ICD-10-CM | POA: Insufficient documentation

## 2019-10-29 DIAGNOSIS — G629 Polyneuropathy, unspecified: Secondary | ICD-10-CM

## 2019-10-29 DIAGNOSIS — R197 Diarrhea, unspecified: Secondary | ICD-10-CM

## 2019-10-29 DIAGNOSIS — M4626 Osteomyelitis of vertebra, lumbar region: Secondary | ICD-10-CM

## 2019-10-29 NOTE — Progress Notes (Addendum)
[facility]  Patient Active Problem List   Diagnosis Date Noted  . Status post lumbar spinal fusion 07/10/2019    Priority: High  . Acute osteomyelitis of lumbar spine ([place]) 06/25/2019    Priority: High  . Osteopenia 01/22/2020  . Aortic atherosclerosis ([place]) 01/22/2020  . Wheezing 01/22/2020  . Diarrhea 01/22/2020  . Insomnia 10/29/2019  . Recurrent falls 09/06/2019  . Confusion, postoperative   . Benign essential HTN   . Post-operative pain   . Chronic constipation   . 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 Post op infection 07/11/2019  . Muscle spasm   . Hypokalemia   . Postoperative pain   . Acute blood loss anemia   . Labile blood pressure   . Tobacco abuse   . Hypoalbuminemia due to protein-calorie malnutrition ([place])   . Anxiety 07/05/2019  . Depression 07/05/2019  . Chronic diastolic CHF (congestive heart failure) ([place]) 07/05/2019  . Obesity (BMI 30-39.9) 07/05/2019  . ETOH abuse 07/05/2019  . Lumbar radiculopathy 06/28/2019  . Spinal stenosis of lumbar region 06/28/2019  . Acute on chronic anemia   . Tremors of nervous system   . AKI (acute kidney injury) ([place])   . Chronic pain syndrome   . Acute renal failure superimposed on stage 3 chronic kidney disease ([place]) 06/25/2019  . Weakness of left lower extremity   . Abdominal pain 06/24/2019  . Nausea 06/24/2019  . Abnormal urine odor 06/24/2019  . History of sepsis 06/24/2019  . Recent major surgery 06/24/2019  . History of diverticulosis 06/24/2019  . Elevated PTHrP level 05/09/2019  . Paresthesia 02/12/2019  . Low back pain 02/12/2019  . Neuropathy 12/20/2018  . Lumbar spinal stenosis 06/29/2018  . Preoperative clearance 06/04/2018  . Abnormal CT of the chest 06/04/2018  . Smoker 05/17/2018  . Hypothyroidism 11/30/2017  . Thoracic aortic atherosclerosis ([place]) 09/14/2016  . Benign essential tremor 09/09/2016  . RLS (restless legs syndrome) 09/09/2016  . H/O  adenomatous polyp of colon 07/06/2016  . PVC's (premature ventricular contractions) 03/29/2016  . Preoperative cardiovascular examination 03/29/2016  . Grade I diastolic dysfunction 30/16/0109  . Palpitations 01/11/2016  . Shortness of breath 01/11/2016  . Former cigarette smoker 01/11/2016  . Leg edema 01/11/2016  . Chronic cough 10/28/2015  . 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 Malignant neoplasm of upper-inner quadrant of left breast in female, estrogen receptor positive ([place]) 07/14/2015  . Prediabetes 07/08/2015  . CKD (chronic kidney disease), stage III 07/08/2015  . Dependent edema 06/08/2015  . Sleep disturbance 06/08/2015  . Mixed hyperlipidemia 06/08/2015  . Generalized anxiety disorder 06/08/2015  . Essential hypertension 06/08/2015  . COPD (chronic obstructive pulmonary disease) ([place]) 06/08/2015  . Gout 06/08/2015  . Macrocytic anemia   . 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 Sepsis ([place]) 05/15/2015  . Lumbar spondylosis 03/20/2015    Patient's Medications  New Prescriptions   No medications on file  Previous Medications   ACETAMINOPHEN (TYLENOL) 325 MG TABLET    Take 2 tablets (650 mg total) by mouth every 6 (six) hours as needed for moderate pain.   ALBUTEROL (VENTOLIN HFA) 108 (90 BASE) MCG/ACT INHALER    INHALE 2 PUFFS BY MOUTH EVERY 4 TO 6 HOURS AS NEEDED   ALLOPURINOL (ZYLOPRIM) 300 MG TABLET    TAKE 1 TABLET(300 MG) BY MOUTH DAILY   ANASTROZOLE (ARIMIDEX) 1 MG TABLET    TAKE 1 TABLET(1 MG) BY MOUTH DAILY   ATORVASTATIN (LIPITOR) 20  MG TABLET    Take 1 tablet (20 mg total) by mouth daily.   CEFAZOLIN (ANCEF) 10 G INJECTION       CEPHALEXIN (KEFLEX) 500 MG CAPSULE    Take 1 capsule (500 mg total) by mouth 3 (three) times daily.   DIAZEPAM (VALIUM) 5 MG TABLET    Take 1 tablet (5 mg total) by mouth every 6 (six) hours as needed for muscle spasms.   DOCUSATE SODIUM (COLACE) 100 MG CAPSULE    Take 1 capsule (100 mg total) by mouth 3 (three) times daily.    DULOXETINE (CYMBALTA) 60 MG CAPSULE    TAKE 1 CAPSULE(60 MG) BY MOUTH DAILY   FUROSEMIDE (LASIX) 20 MG TABLET    Take 1 tablet (20 mg total) by mouth daily as needed.   LEVOTHYROXINE (SYNTHROID) 75 MCG TABLET    Take 1 tablet (75 mcg total) by mouth daily before breakfast.   MAGNESIUM GLUCONATE (MAGONATE) 500 MG TABLET    Take 2 tablets (1,000 mg total) by mouth at bedtime.   METOPROLOL SUCCINATE (TOPROL-XL) 25 MG 24 HR TABLET    Take 1 tablet (25 mg total) by mouth daily.   ONDANSETRON (ZOFRAN) 4 MG TABLET    Take 1 tablet (4 mg total) by mouth daily as needed for nausea or vomiting.   OXYCODONE-ACETAMINOPHEN (PERCOCET) 7.5-325 MG TABLET    Take 1 tablet by mouth 2 (two) times daily as needed for up to 1 dose.   POLYETHYLENE GLYCOL POWDER (MIRALAX) POWDER    Take 17 g by mouth daily as needed for moderate constipation.    PRIMIDONE (MYSOLINE) 50 MG TABLET    TAKE 1 TABLET(50 MG) BY MOUTH AT BEDTIME   PROBIOTIC PRODUCT (PROBIOTIC DAILY PO)    Take by mouth.   QUETIAPINE (SEROQUEL) 25 MG TABLET    Take 1 tablet (25 mg total) by mouth at bedtime.   ROPINIROLE (REQUIP) 0.25 MG TABLET    Take 3 tablets (0.75 mg total) by mouth at bedtime.   VITAMIN B-12 (CYANOCOBALAMIN) 1000 MCG TABLET    Take 1 tablet (1,000 mcg total) by mouth daily.  Modified Medications   No medications on file  Discontinued Medications   No medications on file    Subjective: Brandi Stephens is in for her routine follow-up visit.  She is accompanied by her daughter.  She has a history of lumbar stenosis and radiculopathy.  She underwent a third lumbar surgery on 05/30/2019.  She had decompression of L2-3 with extension of her previous fusion from L2-S1.  She was discharged home the following day.  She says that she had severe pain right after surgery that was unlike anything she had experienced with previous surgeries.  She said she felt like her back was swollen although there was no visible swelling, redness or unusual drainage from  her incision.  The pain progressively worsened.  After about a week she also began to notice some left lower quadrant pain that she said felt like "ovarian pain".  She was treated with 2 rounds of steroids without relief.  Because of worsening pain she was admitted to the hospital on 06/24/2019.  She has noted worsening pain and muscle spasms.  Because of the left lower quadrant pain she was evaluated for possible UTI.  She has not had any dysuria.  She was treated with ciprofloxacin from March 18-22 without any change in her pain.  Repeat CT scan obtained yesterday revealed:  Musculoskeletal: Multilevel degenerative changes of the spine L2-S1  disc spacer and posterior fusion. There is grade 1 L2-L3 retrolisthesis with progression of the posterior slippage of the disc spacer at L2-L3 compared to the prior CT. The L2 fusion screws breach the superior endplate of L2 similar to prior CT. There is bone lucency adjacent to the L2 screws in the posterior elements, left greater right and progressed since the prior CT. There is thickening of the prevertebral soft tissues at L2-L3 with several ill-defined small loculated fluid in the adjacent soft tissues and in the psoas musculature most consistent with small abscesses. There is irregularity of the L2 inferior endplate and L3 superior endplate. Posterior hardware is intact. No acute osseous pathology.  IMPRESSION: 1. Extensive postsurgical changes of the lumbar spine with posterior fusion at L2-S1. Interval posterior slippage of the disc spacer at L2-L3 with findings of loosening of the left L2 screw. There are findings of infection and small paraspinal abscesses at L2-L3.  Fluid was aspirated from the L3-4 level on 07/11/2019.  No organisms were seen on Gram stain but cultures grew methicillin susceptible coagulase-negative staph.  A PICC was placed and she was started on IV cefazolin.  She had further debridement and hardware revision on 07/24/2019.  She  completed 41 days of therapy, 08/21/2019 before converting to oral cephalexin.  She has now completed 4 months of total therapy.  She is feeling much better.  Her diarrhea resolved when she stopped using excessive laxatives.  She is getting by with less narcotic pain medication.  She starts outpatient physical therapy tomorrow  Review of Systems: Review of Systems  Constitutional: Negative for chills, diaphoresis and fever.  Respiratory: Negative for cough and shortness of breath.   Cardiovascular: Negative for chest pain.  Gastrointestinal: Positive for constipation. Negative for abdominal pain, diarrhea, nausea and vomiting.  Musculoskeletal: Positive for back pain.    Past Medical History:  Diagnosis Date  . AIN (anal intraepithelial neoplasia) anal canal   . Anemia    with pregnancy  . Anxiety   . Arthritis    knees, lower back, knees  . Chronic constipation   . Chronic diastolic CHF (congestive heart failure) ([place]) 07/05/2019  . CKD (chronic kidney disease)   . Coarse tremors    Essential  . COPD (chronic obstructive pulmonary disease) ([place])    2017 chest CT, history of  . Degenerative lumbar spinal stenosis   . Depression   . Depression 07/05/2019  . Drug dependency ([place])   . Elevated PTHrP level 05/09/2019  . ETOH abuse 07/05/2019  . GERD (gastroesophageal reflux disease)    pt denies  . Gout   . Grade I diastolic dysfunction 26/94/8546   Noted ECHO  . Hemorrhoids   . History of adenomatous polyp of colon   . History of basal cell carcinoma (BCC) excision 2015   nose  . History of sepsis 05/14/2014   post lumbar surgery  . History of shingles    x2  . Hyperlipidemia   . Hypertension   . Hypothyroidism   . Irregular heart beat    history of while undergoing radiation  . Malignant neoplasm of upper-inner quadrant of left breast in female, estrogen receptor positive [facility]) oncologist-  dr Jana Hakim--  per lov in epic no recurrence   dx 07-13-2015  Left breast invasive  ductal carcinoma, Stage IA, Grade 1 (TXN0),  09-16-2015  s/p  left breast lumpectomy with sln bx's,  completed radiation 11-18-2015,  started antiestrogen therapy 12-14-2015  . Neuropathy    Left foot and  back of left leg  . Obesity (BMI 30-39.9) 07/05/2019  . Personal history of radiation therapy    completed 11-18-2015  left breast  . Pneumonia   . PONV (postoperative nausea and vomiting)   . Prediabetes    diet controlled, no med  . Restless leg syndrome   . Seasonal allergies   . Seizure ([place]) 05/2015   due to sepsis, only one time episode  . Tremor   . Wears partial dentures    lower     Social History   Tobacco Use  . Smoking status: Former Smoker    Packs/day: 0.50    Years: 35.00    Pack years: 17.50    Types: Cigarettes    Quit date: 05/22/2018    Years since quitting: 1.7  . Smokeless tobacco: Never Used  Vaping Use  . Vaping Use: Never used  Substance Use Topics  . Alcohol use: Yes    Comment: 1 glass of wine a night  . Drug use: Not Currently    Comment: CBD tincture    Family History  Problem Relation Age of Onset  . Atrial fibrillation Mother   . Ulcers Father   . Breast cancer Maternal Aunt   . Lung cancer Maternal Uncle   . Stomach cancer Maternal Grandmother   . Lung cancer Maternal Aunt   . Brain cancer Maternal Aunt   . Prostate cancer Maternal Grandfather   . Stroke Paternal Grandmother   . Tuberculosis Paternal Grandfather   . Colon cancer Neg Hx   . Esophageal cancer Neg Hx   . Rectal cancer Neg Hx     Allergies  Allergen Reactions  . Ampicillin Hives and Other (See Comments)    Severe reaction in February 2017 1.5 month to have hives to go away  . Gabapentin Swelling    UNSPECIFIED REACTION   . Penicillins Hives and Other (See Comments)    Severe reaction in February 2017 1.5 month to have hives to go away Has patient had a PCN reaction causing immediate rash, facial/tongue/throat swelling, SOB or lightheadedness with hypotension: #   #  #  YES  #  #  #  Has patient had a PCN reaction causing severe rash involving mucus membranes or skin necrosis: No Has patient had a PCN reaction that required hospitalization No Has patient had a PCN reaction occurring within the last 10 years: No   . Codeine Nausea And Vomiting  . Other Itching    UNSPECIFIED Analgesics    Objective: Vitals:   10/29/19 1451  BP: 137/88  Pulse: 100  SpO2: 91%  Weight: 238 lb (108 kg)   Body mass index is 37.28 kg/m.  Physical Exam Constitutional:      Comments: She is in good spirits.  She no longer needs a wheelchair. She ambulates with the aid of a walker.  Cardiovascular:     Rate and Rhythm: Normal rate and regular rhythm.     Heart sounds: No murmur heard.   Pulmonary:     Effort: Pulmonary effort is normal.     Breath sounds: Normal breath sounds.  Abdominal:     Palpations: Abdomen is soft.     Tenderness: There is no abdominal tenderness.  Musculoskeletal:     Comments: Her back incision has healed without evidence of infection.  Psychiatric:        Mood and Affect: Mood normal.     Lab Results 08/20/2019 Sedimentation rate 64 C-reactive protein 15  Problem List Items Addressed This Visit      High   Acute osteomyelitis of lumbar spine ([place])    She is making slow progress.  Her sedimentation rate has normalized but her C-reactive protein remains elevated.  She prefers to continue cephalexin for now.  She will follow-up in 3 months.      Relevant Orders   C-reactive protein (Completed)   Sedimentation rate (Completed)       [name], MD [facility] [place] [phone] pager   [phone] cell 02/12/2020, 1:22 PM

## 2019-10-29 NOTE — Assessment & Plan Note (Signed)
She is making slow progress.  Her sedimentation rate has normalized but her C-reactive protein remains elevated.  She prefers to continue cephalexin for now.  She will follow-up in 3 months.

## 2019-10-29 NOTE — Progress Notes (Signed)
   Subjective:    Patient ID: Brandi Stephens, female    DOB: 1945/06/22, 74 y.o.   MRN: [id number]  HPI Chief Complaint  Patient presents with  . 6 week follow-up    6 week follow-up, shaking really bad   She is here for a 6 week follow up. Her daughter is with her.  She is ambulating with a walker.  She was having diarrhea at our last visit. States her stool is now more formed than it was. She had a negative C-diff test.  Drinks a lot of water and does not feel dehydrated.    States she is having issues with insomnia. She falls asleep but wakes up after a couple of hours and cannot fall back to sleep. She is taking seroquel but a low dose. She also take Valium in the evening.  States she does nap during the day but only for 45 minutes.  She is still drinking a glass of wine at night.   Starting PT tomorrow  No recent falls.   Taking Percocet in the afternoon.   States her tremor is worse. Taking primidone.  Questions whether she could have Parkinson's   Sees Dr. Megan Salon with ID later today.   Reports neuropathy is worsening. States Cymbalta was working for her but it does not seem to be working any longer.  Cannot take Lyrica or gabapentin.   Denies fever, chills, dizziness, chest pain, palpitations, shortness of breath, abdominal pain, N/V.   Review of Systems Pertinent positives and negatives in the history of present illness.     Objective:   Physical Exam BP 120/70   Pulse 100   Wt 223 lb (101.2 kg)   BMI 34.93 kg/m   Alert and oriented and in no acute distress. Not otherwise examined.       Assessment & Plan:  Diarrhea, unspecified type -seems to be improving. C-diff negative. She will let me know if this is getting worse.   Benign essential HTN -BP in goal range   Neuropathy -Cymbalta no longer seems to be working. She has had side effects with Gabapentin and Lyrica. Refer back to Dr. Krista Blue for recommendations.   Benign essential tremor -continue  primidone and follow up with Dr. Krista Blue for recommendations.   Insomnia, unspecified type -recommend she stop drinking wine at night. I am not comfortable prescribing additional sedating medications or increasing her current dose of seroquel but this may be a good option. Recommend follow up with neurology. She is no longer seeing psychiatry.

## 2019-10-29 NOTE — Patient Instructions (Signed)
Call and schedule with Dr. Krista Blue as discussed for the neuropathic pain, sleep issues and other questions.   Good luck with physical therapy, you will do great.

## 2019-10-30 ENCOUNTER — Other Ambulatory Visit: Payer: Self-pay

## 2019-10-30 ENCOUNTER — Ambulatory Visit: Payer: PPO | Attending: Physical Medicine and Rehabilitation | Admitting: Physical Therapy

## 2019-10-30 ENCOUNTER — Encounter: Payer: Self-pay | Admitting: Physical Therapy

## 2019-10-30 ENCOUNTER — Encounter: Payer: PPO | Attending: Physical Medicine and Rehabilitation | Admitting: Physical Medicine and Rehabilitation

## 2019-10-30 ENCOUNTER — Encounter: Payer: Self-pay | Admitting: Physical Medicine and Rehabilitation

## 2019-10-30 VITALS — BP 108/71 | HR 107 | Temp 98.3°F | Ht 67.0 in | Wt 223.0 lb

## 2019-10-30 DIAGNOSIS — Z5181 Encounter for therapeutic drug level monitoring: Secondary | ICD-10-CM | POA: Insufficient documentation

## 2019-10-30 DIAGNOSIS — Z79891 Long term (current) use of opiate analgesic: Secondary | ICD-10-CM | POA: Diagnosis not present

## 2019-10-30 DIAGNOSIS — M545 Low back pain: Secondary | ICD-10-CM | POA: Insufficient documentation

## 2019-10-30 DIAGNOSIS — I10 Essential (primary) hypertension: Secondary | ICD-10-CM | POA: Insufficient documentation

## 2019-10-30 DIAGNOSIS — G894 Chronic pain syndrome: Secondary | ICD-10-CM | POA: Diagnosis not present

## 2019-10-30 DIAGNOSIS — M5416 Radiculopathy, lumbar region: Secondary | ICD-10-CM | POA: Diagnosis not present

## 2019-10-30 DIAGNOSIS — I952 Hypotension due to drugs: Secondary | ICD-10-CM

## 2019-10-30 DIAGNOSIS — K5900 Constipation, unspecified: Secondary | ICD-10-CM | POA: Insufficient documentation

## 2019-10-30 DIAGNOSIS — Z981 Arthrodesis status: Secondary | ICD-10-CM | POA: Diagnosis not present

## 2019-10-30 DIAGNOSIS — G8929 Other chronic pain: Secondary | ICD-10-CM | POA: Diagnosis not present

## 2019-10-30 DIAGNOSIS — R2689 Other abnormalities of gait and mobility: Secondary | ICD-10-CM | POA: Insufficient documentation

## 2019-10-30 DIAGNOSIS — M6281 Muscle weakness (generalized): Secondary | ICD-10-CM | POA: Insufficient documentation

## 2019-10-30 DIAGNOSIS — G8918 Other acute postprocedural pain: Secondary | ICD-10-CM | POA: Diagnosis not present

## 2019-10-30 DIAGNOSIS — M4620 Osteomyelitis of vertebra, site unspecified: Secondary | ICD-10-CM | POA: Diagnosis not present

## 2019-10-30 LAB — SEDIMENTATION RATE: Sed Rate: 19 mm/h (ref 0–30)

## 2019-10-30 LAB — C-REACTIVE PROTEIN: CRP: 9.2 mg/L — ABNORMAL HIGH (ref <8.0)

## 2019-10-30 MED ORDER — AMITRIPTYLINE HCL 10 MG PO TABS: 10.0000 mg | ORAL_TABLET | Freq: Every day | ORAL | 0 refills | Status: DC

## 2019-10-30 MED ORDER — PRIMIDONE 50 MG PO TABS: 50.0000 mg | ORAL_TABLET | Freq: Two times a day (BID) | ORAL | 3 refills | Status: DC

## 2019-10-30 MED ORDER — DIAZEPAM 5 MG PO TABS: 5.0000 mg | ORAL_TABLET | Freq: Four times a day (QID) | ORAL | 0 refills | Status: DC | PRN

## 2019-10-30 MED ORDER — OXYCODONE HCL 5 MG PO TABS: 5.0000 mg | ORAL_TABLET | ORAL | 0 refills | Status: DC | PRN

## 2019-10-30 NOTE — Therapy (Signed)
[place], [place], [address] Phone: [phone]   Fax:  [phone]  Physical Therapy Evaluation  Patient Details  Name: Brandi Stephens MRN: [id number] Date of Birth: Nov 16, 1945 Referring Provider (PT): Leeroy Cha, MD   Encounter Date: 10/30/2019   PT End of Session - 10/30/19 1723    Visit Number 1    Number of Visits 12    Date for PT Re-Evaluation 12/11/19    Authorization Type [place] Medicare, progress note by visit 10    PT Start Time 1414    PT Stop Time 1500    PT Time Calculation (min) 46 min    Activity Tolerance Patient limited by pain;Patient limited by fatigue    Behavior During Therapy [facility] for tasks assessed/performed           Past Medical History:  Diagnosis Date  . AIN (anal intraepithelial neoplasia) anal canal   . Anemia    with pregnancy  . Anxiety   . Arthritis    knees, lower back, knees  . Chronic constipation   . Chronic diastolic CHF (congestive heart failure) ([place]) 07/05/2019  . CKD (chronic kidney disease)   . Coarse tremors    Essential  . COPD (chronic obstructive pulmonary disease) ([place])    2017 chest CT, history of  . Degenerative lumbar spinal stenosis   . Depression   . Depression 07/05/2019  . Drug dependency ([place])   . Elevated PTHrP level 05/09/2019  . ETOH abuse 07/05/2019  . GERD (gastroesophageal reflux disease)    pt denies  . Gout   . Grade I diastolic dysfunction 26/33/3545   Noted ECHO  . Hemorrhoids   . History of adenomatous polyp of colon   . History of basal cell carcinoma (BCC) excision 2015   nose  . History of sepsis 05/14/2014   post lumbar surgery  . History of shingles    x2  . Hyperlipidemia   . Hypertension   . Hypothyroidism   . Irregular heart beat    history of while undergoing radiation  . Malignant neoplasm of upper-inner quadrant of left breast in female, estrogen receptor positive [facility]) oncologist-  dr Jana Hakim--  per  lov in epic no recurrence   dx 07-13-2015  Left breast invasive ductal carcinoma, Stage IA, Grade 1 (TXN0),  09-16-2015  s/p  left breast lumpectomy with sln bx's,  completed radiation 11-18-2015,  started antiestrogen therapy 12-14-2015  . Neuropathy    Left foot and back of left leg  . Obesity (BMI 30-39.9) 07/05/2019  . Personal history of radiation therapy    completed 11-18-2015  left breast  . Pneumonia   . PONV (postoperative nausea and vomiting)   . Prediabetes    diet controlled, no med  . Restless leg syndrome   . Seasonal allergies   . Seizure ([place]) 05/2015   due to sepsis, only one time episode  . Tremor   . Wears partial dentures    lower     Past Surgical History:  Procedure Laterality Date  . ABDOMINAL HYSTERECTOMY  1985  . BASAL CELL CARCINOMA EXCISION  10/15   nose  . BREAST BIOPSY Right 08/24/2015  . BREAST BIOPSY Right 07/13/2015  . BREAST BIOPSY Left 07/13/2015  . BREAST EXCISIONAL BIOPSY Left 1972  . BREAST LUMPECTOMY Left   . BREAST LUMPECTOMY WITH RADIOACTIVE SEED AND SENTINEL LYMPH NODE BIOPSY Left 09/16/2015   Procedure: BREAST LUMPECTOMY WITH RADIOACTIVE SEED AND SENTINEL LYMPH NODE  BIOPSY;  Surgeon: Stark Klein, MD;  Location: [place];  Service: General;  Laterality: Left;  . BREAST REDUCTION SURGERY Bilateral 1998  . CATARACT EXTRACTION W/ INTRAOCULAR LENS IMPLANT Left 2016  . [place]   hallo  . COLONOSCOPY  01/2013   polyps  . EYE SURGERY Left 2016  . HAND SURGERY Left 02-19-2002   dr Fredna Dow  @[facility]    repair collateral ligament/ MPJ left thumb  . HARDWARE REMOVAL N/A 07/24/2019   Procedure: REVISION OF HARDWARE Lumbar two - Sacral one. Extention of Fusion to Lumbar one;  Surgeon: [name], MD;  Location: [place];  Service: Neurosurgery;  Laterality: N/A;  . HEMORRHOID SURGERY  2008  . HIGH RESOLUTION ANOSCOPY N/A 02/28/2018   Procedure: HIGH RESOLUTION ANOSCOPY WITH BIOPSY;  Surgeon: Leighton Ruff,  MD;  Location: [facility];  Service: General;  Laterality: N/A;  . KNEE ARTHROSCOPY Right 2002  . LUMBAR SPINE SURGERY  03-20-2015   dr Kathyrn Sheriff   fusion L4-5, L5-S1  . MOUTH SURGERY  07/14/2018   2 infected teeth removed  . MULTIPLE TOOTH EXTRACTIONS     with bone grafting lower bottom right and left  . REDUCTION MAMMAPLASTY Bilateral   . RIGHT COLECTOMY  09-10-2003   dr Margot Chimes @[facility]    multiple colon polyps (per path tubular adenoma's, hyperplastic , benign appendix, benign two lymph nodes  . ROTATOR CUFF REPAIR Left 04/2016  . TONSILLECTOMY  1969  . TUBAL LIGATION Bilateral 1982    There were no vitals filed for this visit.    Subjective Assessment - 10/30/19 1430    Subjective Pt. is a 74 y/o female referred to PT s/p complications from staph infection after lumbar surgery for hardware revision 07/24/19. Pt. was s/p original fusion L4-S1 in 03/2015 and had fusion extended to include L2-3 on 05/30/19. For surgery in April she had initial acute care stay and had transitioned to inpatient rehab but developed complications due to osteomyelitis and was subsequently admitted to ICU. In total between acute care and IP rehab she was hospitalized for 47 days. She discharged home from IP rehab and completed a few weeks of home health therapy with current referral to transition to outpatient therapy. She had had multiple falls over the past 6 months with most recent occurences associated with GI upset/diarrhea while on chronic antibiotics with some incontinence issues with difficulty getting to the bathroon in time.    Patient is accompained by: Family member   daughter   Pertinent History L4-S1 fusion 03/20/15, L2-3 fusion 05/30/19, breast CA history 2017, seizures (due to sepsis),  lumbar hardware removal 07/24/19 with subsequent staph infection/osteomyelitis, CHF, COPD, chronic pain in pain management, history ETOH abuse, neuropathy, intention tremor    Limitations Sitting;House hold  activities;Lifting;Standing;Walking    How long can you sit comfortably? 15 minutes    How long can you stand comfortably? 5 minutes    How long can you walk comfortably? 10 minutes    Currently in Pain? Yes    Pain Score 6     Pain Location Back    Pain Orientation Lower    Pain Descriptors / Indicators Aching;Stabbing   constant ache with intermittent stabbing sensation   Pain Type Chronic pain    Pain Onset More than a month ago    Pain Frequency Constant    Aggravating Factors  prolonged sitting or standing and walking    Pain Relieving Factors ice, medication    Effect  of Pain on Daily Activities limits positional and activity tolerance              [facility] PT Assessment - 10/30/19 0001      Assessment   Medical Diagnosis Paraspinal abscess/spinal staph infection s/p lumbar surgery for hardware revision    Referring Provider (PT) Leeroy Cha, MD    Onset Date/Surgical Date 07/24/19    Hand Dominance Right    Prior Therapy IP rehab and home health therapy      Precautions   Precautions Fall    Precaution Comments recent staph infection/osteomyelitis      Restrictions   Weight Bearing Restrictions No      Balance Screen   Has the patient fallen in the past 6 months Yes    How many times? 6    Has the patient had a decrease in activity level because of a fear of falling?  Yes      [place] Private residence    Living Arrangements Spouse/significant other    Type of [place] to enter    Entrance Stairs-Number of Steps 5    Entrance Stairs-Rails Right;Left    Home Layout Two level    Alternate Level Stairs-Number of Steps [place] - 2 wheels;Walker - 4 wheels;Shower seat;Grab bars - toilet      Prior Function   Level of Independence Independent with community mobility without device      Cognition   Overall Cognitive Status Within Functional  Limits for tasks assessed      Observation/Other Assessments   Skin Integrity --   lumbar incision appears well-healed with no signs infection   Focus on Therapeutic Outcomes (FOTO)  --   not tested/incorrect set-up     Sensation   Light Touch Impaired Detail    Light Touch Impaired Details Impaired LLE      ROM / Strength   AROM / PROM / Strength AROM;PROM;Strength      AROM   Overall AROM Comments Hip AROM/PROM grossly WFL bilat, lumbar AROM grossly WFL for bed mobility and transfers otherwise not formally tested to end-ranged given balance issues and recent surgery    AROM Assessment Site Hip;Lumbar      Strength   Strength Assessment Site Hip;Knee;Ankle    Right/Left Hip Right;Left    Right Hip Flexion 4/5    Right Hip External Rotation  4/5    Right Hip Internal Rotation 4/5    Left Hip Flexion 4-/5    Left Hip External Rotation 4-/5    Left Hip Internal Rotation 4-/5    Right/Left Knee Right;Left    Right Knee Flexion 5/5    Right Knee Extension 5/5    Left Knee Flexion 5/5    Left Knee Extension 5/5    Right/Left Ankle Right;Left    Right Ankle Dorsiflexion 5/5    Right Ankle Inversion 5/5    Right Ankle Eversion 5/5    Left Ankle Dorsiflexion 4+/5    Left Ankle Inversion 4+/5    Left Ankle Eversion 4/5      Flexibility   Soft Tissue Assessment /Muscle Length --   SLR 60 deg right, 70 deg left with hamstring tightness     Special Tests   Other special tests SLR (-)      Transfers   Five time sit to stand comments  17.88  sec      Ambulation/Gait   Gait Comments Tinetti Gait + Balance 19/28, pt. ambulates mod I with RW with bilat. step-through gait, mild antalgia, difficulty turning with walker with tendency to have to turn in wide arc of motion      Standardized Balance Assessment   Standardized Balance Assessment Timed Up and Go Test      Timed Up and Go Test   TUG Comments 14.7 sec with RW      Functional Gait  Assessment   Gait assessed  No                       Objective measurements completed on examination: See above findings.               PT Education - 10/30/19 1722    Education Details eval findings, symptom etiology, POC    Person(s) Educated Patient;Child(ren)    Methods Explanation    Comprehension Verbalized understanding            PT Short Term Goals - 10/30/19 1741      PT SHORT TERM GOAL #1   Title Independent with HEP    Baseline needs update from home health PT HEP    Time 3    Period Weeks    Status New    Target Date 11/20/19      PT SHORT TERM GOAL #2   Title Improve TUG time (with RW) at least 3-5 sec for decreased fall risk    Baseline 14.7 sec    Time 3    Period Weeks    Status New    Target Date 11/20/19      PT SHORT TERM GOAL #3   Title Ambulate at least 100 feet CGA with SPC for progression to LTG for gait    Baseline uses RW    Time 3    Period Weeks    Status New    Target Date 11/20/19             PT Long Term Goals - 10/30/19 1744      PT LONG TERM GOAL #1   Title Improve Tinetti Gait + Balance at least 3-5 points for decreased fall risk    Baseline 19/28    Time 6    Period Weeks    Status New    Target Date 12/11/19      PT LONG TERM GOAL #2   Title Increase left hip and ankle strength at least grossly 1/2 MMT grade to improve ability to ascend/descend stairs to 2nd floor bedroom    Baseline see objective    Time 6    Period Weeks    Status New    Target Date 12/11/19      PT LONG TERM GOAL #3   Title Perform household level ambulation 150 feet or less mod I with SPC vs. LRAD    Baseline uses RW    Time 6    Period Weeks    Status New    Target Date 12/11/19      PT LONG TERM GOAL #4   Title Improve 5 times sit<>stand time to 14 sce or less for improved ability for transfers from low seats    Baseline 17.88 sec    Time 6    Period Weeks    Status New    Target Date 12/11/19      PT LONG TERM GOAL #5  Title Tolerate standing  for periods at least 15-20 min for cooking and light chores with LBP 4/10 or less    Baseline tolerance limited to 5 min, pain 6/10 this PM    Time 6    Period Weeks    Status New    Target Date 12/11/19                  Plan - 10/30/19 1724    Clinical Impression Statement Pt. presents s/p complications for lumbar surgery this past April with staph infection with impaired gait/decreased balance with Tinetti Gait + Balance and TUG scores indicative of increased fall risk. She also presents with left>right LE as well as  core weakness and limited positional tolerance. Pt. would benefit from PT to work on building strength as well as gait and balance training to improve functional status and safety for mobility.    Personal Factors and Comorbidities Comorbidity 3+;Time since onset of injury/illness/exacerbation;Age    Comorbidities see PMH    Examination-Activity Limitations Sit;Dressing;Bathing;Transfers;Sleep;Hygiene/Grooming;Bed Mobility;Bend;Lift;Squat;Stairs;Locomotion Level;Carry;Stand;Continence;Toileting    Examination-Participation Restrictions Community Activity;Shop;Cleaning;Laundry;Meal Prep    Stability/Clinical Decision Making Evolving/Moderate complexity    Clinical Decision Making Moderate    Rehab Potential Good    PT Frequency 2x / week    PT Duration 6 weeks    PT Treatment/Interventions ADLs/Self Care Home Management;Cryotherapy;Moist Heat;Therapeutic activities;Gait training;Therapeutic exercise;Patient/family education;Manual techniques;Stair training;Functional mobility training;Balance training;Neuromuscular re-education;Taping    PT Next Visit Plan NUSTEP warm up, work on building standing tolerance with back/core/LE strengthening (try Theraband row, ext, 4 in step up, mini squat) and balance/proprioceptive challenges with Airex, gait training-when able progress to trial [facility], work on "turns" with RW, mat based back and core strengthening as tolerated    PT Home  Exercise Plan no time for HEP at eval-instruct as appropriate at future tx. session.    Consulted and Agree with Plan of Care Patient;Family member/caregiver           Patient will benefit from skilled therapeutic intervention in order to improve the following deficits and impairments:  Abnormal gait, Decreased balance, Decreased endurance, Difficulty walking, Impaired sensation, Pain, Impaired flexibility, Decreased strength, Decreased activity tolerance  Visit Diagnosis: Chronic bilateral low back pain, unspecified whether sciatica present  Muscle weakness (generalized)  Other abnormalities of gait and mobility     Problem List Patient Active Problem List   Diagnosis Date Noted  . Insomnia 10/29/2019  . Recurrent falls 09/06/2019  . Confusion, postoperative   . Benign essential HTN   . Post-operative pain   . Chronic constipation   . 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 Post op infection 07/11/2019  . Muscle spasm   . Hypokalemia   . Status post lumbar spinal fusion 07/10/2019  . Postoperative pain   . Acute blood loss anemia   . Labile blood pressure   . Tobacco abuse   . Hypoalbuminemia due to protein-calorie malnutrition ([place])   . Anxiety 07/05/2019  . Depression 07/05/2019  . Chronic diastolic CHF (congestive heart failure) ([place]) 07/05/2019  . Obesity (BMI 30-39.9) 07/05/2019  . ETOH abuse 07/05/2019  . Lumbar radiculopathy 06/28/2019  . Spinal stenosis of lumbar region 06/28/2019  . Acute on chronic anemia   . Tremors of nervous system   . AKI (acute kidney injury) ([place])   . Chronic pain syndrome   . Acute renal failure superimposed on stage 3 chronic kidney disease ([place]) 06/25/2019  . Acute osteomyelitis of lumbar spine ([place]) 06/25/2019  . Weakness of left  lower extremity   . Abdominal pain 06/24/2019  . Nausea 06/24/2019  . Abnormal urine odor 06/24/2019  . History of sepsis 06/24/2019  . Recent major surgery 06/24/2019  . History of diverticulosis  06/24/2019  . Elevated PTHrP level 05/09/2019  . Paresthesia 02/12/2019  . Low back pain 02/12/2019  . Neuropathy 12/20/2018  . Lumbar spinal stenosis 06/29/2018  . Preoperative clearance 06/04/2018  . Abnormal CT of the chest 06/04/2018  . Smoker 05/17/2018  . Hypothyroidism 11/30/2017  . Thoracic aortic atherosclerosis ([place]) 09/14/2016  . Benign essential tremor 09/09/2016  . RLS (restless legs syndrome) 09/09/2016  . H/O adenomatous polyp of colon 07/06/2016  . PVC's (premature ventricular contractions) 03/29/2016  . Preoperative cardiovascular examination 03/29/2016  . Grade I diastolic dysfunction 09/47/0962  . Palpitations 01/11/2016  . Shortness of breath 01/11/2016  . Former cigarette smoker 01/11/2016  . Leg edema 01/11/2016  . Chronic cough 10/28/2015  . 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 Malignant neoplasm of upper-inner quadrant of left breast in female, estrogen receptor positive ([place]) 07/14/2015  . Prediabetes 07/08/2015  . CKD (chronic kidney disease), stage III 07/08/2015  . Dependent edema 06/08/2015  . Sleep disturbance 06/08/2015  . Mixed hyperlipidemia 06/08/2015  . Generalized anxiety disorder 06/08/2015  . Essential hypertension 06/08/2015  . COPD (chronic obstructive pulmonary disease) ([place]) 06/08/2015  . Gout 06/08/2015  . Macrocytic anemia   . 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 Sepsis ([place]) 05/15/2015  . Lumbar spondylosis 03/20/2015    Beaulah Dinning, PT, DPT 10/30/19 5:54 PM  [facility] [address] [place], [place], [address] Phone: [phone]   Fax:  [phone]  Name: CHAE SHUSTER MRN: [id number] Date of Birth: [birth year]

## 2019-10-30 NOTE — Progress Notes (Signed)
Subjective:    Patient ID: Brandi Stephens, female    DOB: 1946-01-13, 74 y.o.   MRN: [id number]  HPI  Brandi Stephens presents for hospital follow-up after CIR admission for staph bacteremia after infection of her spinal hardware.  Last visit I transitioned her to outaptient therapy and she has an appointment today. She has noted improvements in her strength. She is ambulating steadily with RW today.   Pain is currently 5/10 and worst in her lower back where her spinal harware is present. She has been taking approximately 1 percocet per day and would like to gradually stop using these. She has also been using four 650mg  of Tylenol per day and two 200mg  ibuprofen daily.   She no longer has severe diarrhea or constipation and is much happier about this.   Her daughter accompanies her this visit and helps to provide history. She and her daughter have been out of work for 5 years due to her health conditions and need for her daughter's assistance. Her grandchild recently graduated kindergarten.   She continues to have mild bilateral lower extremity edema. Discussed use of compression garments, elevation, and ice.    Pain Inventory Average Pain 5 Pain Right Now 6 My pain is constant, sharp, burning, stabbing and aching  In the last 24 hours, has pain interfered with the following? General activity 6 Relation with others 3 Enjoyment of life 3 What TIME of day is your pain at its worst? daytime night Sleep (in general) Poor  Pain is worse with: walking, bending, sitting and standing Pain improves with: rest, heat/ice, therapy/exercise and medication Relief from Meds: 5  Mobility walk with assistance use a walker how many minutes can you walk? 5 ability to climb steps?  yes do you drive?  no  Function retired I need assistance with the following:  bathing, meal prep, household duties and shopping  Neuro/Psych weakness numbness tremor tingling trouble  walking dizziness confusion  Prior Studies x-rays  Physicians involved in your care Any changes since last visit?  no Primary care Harland Dingwall, NP Neurosurgeon Dr. Diamantina Monks Infectious Disease   Family History  Problem Relation Age of Onset  . Atrial fibrillation Mother   . Ulcers Father   . Breast cancer Maternal Aunt   . Lung cancer Maternal Uncle   . Stomach cancer Maternal Grandmother   . Lung cancer Maternal Aunt   . Brain cancer Maternal Aunt   . Prostate cancer Maternal Grandfather   . Stroke Paternal Grandmother   . Tuberculosis Paternal Grandfather   . Colon cancer Neg Hx   . Esophageal cancer Neg Hx   . Rectal cancer Neg Hx    Social History   Socioeconomic History  . Marital status: Married    Spouse name: robert  . Number of children: 3  . Years of education: college  . Highest education level: Not on file  Occupational History  . Occupation: retired  Tobacco Use  . Smoking status: Former Smoker    Packs/day: 0.50    Years: 35.00    Pack years: 17.50    Types: Cigarettes    Quit date: 05/22/2018    Years since quitting: 1.4  . Smokeless tobacco: Never Used  Vaping Use  . Vaping Use: Never used  Substance and Sexual Activity  . Alcohol use: Yes    Comment: 1 glass of wine a night  . Drug use: Never    Comment: CBD tincture  . Sexual activity: Yes  Birth control/protection: Post-menopausal  Other Topics Concern  . Not on file  Social History Narrative   Lives with husband in a 2 story home.  Has 2 living children.  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  Education: college.    Right-handed.   Five cups coffee per week.   Social Determinants of Health   Financial Resource Strain:   . Difficulty of Paying Living Expenses:   Food Insecurity:   . Worried About Charity fundraiser in the Last Year:   . Arboriculturist in the Last Year:   Transportation Needs:   . Film/video editor (Medical):   Marland Kitchen Lack of Transportation  (Non-Medical):   Physical Activity:   . Days of Exercise per Week:   . Minutes of Exercise per Session:   Stress:   . Feeling of Stress :   Social Connections:   . Frequency of Communication with Friends and Family:   . Frequency of Social Gatherings with Friends and Family:   . Attends Religious Services:   . Active Member of Clubs or Organizations:   . Attends Archivist Meetings:   Marland Kitchen Marital Status:    Past Surgical History:  Procedure Laterality Date  . ABDOMINAL HYSTERECTOMY  1985  . BASAL CELL CARCINOMA EXCISION  10/15   nose  . BREAST BIOPSY Right 08/24/2015  . BREAST BIOPSY Right 07/13/2015  . BREAST BIOPSY Left 07/13/2015  . BREAST EXCISIONAL BIOPSY Left 1972  . BREAST LUMPECTOMY Left   . BREAST LUMPECTOMY WITH RADIOACTIVE SEED AND SENTINEL LYMPH NODE BIOPSY Left 09/16/2015   Procedure: BREAST LUMPECTOMY WITH RADIOACTIVE SEED AND SENTINEL LYMPH NODE BIOPSY;  Surgeon: Stark Klein, MD;  Location: [place];  Service: General;  Laterality: Left;  . BREAST REDUCTION SURGERY Bilateral 1998  . CATARACT EXTRACTION W/ INTRAOCULAR LENS IMPLANT Left 2016  . [place]   hallo  . COLONOSCOPY  01/2013   polyps  . EYE SURGERY Left 2016  . HAND SURGERY Left 02-19-2002   dr Fredna Dow  @[facility]    repair collateral ligament/ MPJ left thumb  . HARDWARE REMOVAL N/A 07/24/2019   Procedure: REVISION OF HARDWARE Lumbar two - Sacral one. Extention of Fusion to Lumbar one;  Surgeon: [name], MD;  Location: [place];  Service: Neurosurgery;  Laterality: N/A;  . HEMORRHOID SURGERY  2008  . HIGH RESOLUTION ANOSCOPY N/A 02/28/2018   Procedure: HIGH RESOLUTION ANOSCOPY WITH BIOPSY;  Surgeon: Leighton Ruff, MD;  Location[facility];  Service: General;  Laterality: N/A;  . KNEE ARTHROSCOPY Right 2002  . LUMBAR SPINE SURGERY  03-20-2015   dr Kathyrn Sheriff   fusion L4-5, L5-S1  . MOUTH SURGERY  07/14/2018   2 infected teeth removed  .  MULTIPLE TOOTH EXTRACTIONS     with bone grafting lower bottom right and left  . REDUCTION MAMMAPLASTY Bilateral   . RIGHT COLECTOMY  09-10-2003   dr Margot Chimes @[facility]    multiple colon polyps (per path tubular adenoma's, hyperplastic , benign appendix, benign two lymph nodes  . ROTATOR CUFF REPAIR Left 04/2016  . TONSILLECTOMY  1969  . TUBAL LIGATION Bilateral 1982   Past Medical History:  Diagnosis Date  . AIN (anal intraepithelial neoplasia) anal canal   . Anemia    with pregnancy  . Anxiety   . Arthritis    knees, lower back, knees  . Chronic constipation   . Chronic diastolic CHF (congestive heart failure) ([place]) 07/05/2019  .  CKD (chronic kidney disease)   . Coarse tremors    Essential  . COPD (chronic obstructive pulmonary disease) ([place])    2017 chest CT, history of  . Degenerative lumbar spinal stenosis   . Depression   . Depression 07/05/2019  . Drug dependency ([place])   . Elevated PTHrP level 05/09/2019  . ETOH abuse 07/05/2019  . GERD (gastroesophageal reflux disease)    pt denies  . Gout   . Grade I diastolic dysfunction 04/20/7251   Noted ECHO  . Hemorrhoids   . History of adenomatous polyp of colon   . History of basal cell carcinoma (BCC) excision 2015   nose  . History of sepsis 05/14/2014   post lumbar surgery  . History of shingles    x2  . Hyperlipidemia   . Hypertension   . Hypothyroidism   . Irregular heart beat    history of while undergoing radiation  . Malignant neoplasm of upper-inner quadrant of left breast in female, estrogen receptor positive [facility]) oncologist-  dr Jana Hakim--  per lov in epic no recurrence   dx 07-13-2015  Left breast invasive ductal carcinoma, Stage IA, Grade 1 (TXN0),  09-16-2015  s/p  left breast lumpectomy with sln bx's,  completed radiation 11-18-2015,  started antiestrogen therapy 12-14-2015  . Neuropathy    Left foot and back of left leg  . Obesity (BMI 30-39.9) 07/05/2019  . Personal history of radiation therapy    completed  11-18-2015  left breast  . Pneumonia   . PONV (postoperative nausea and vomiting)   . Prediabetes    diet controlled, no med  . Restless leg syndrome   . Seasonal allergies   . Seizure ([place]) 05/2015   due to sepsis, only one time episode  . Tremor   . Wears partial dentures    lower    BP 108/71   Pulse (!) 107   Temp 98.3 F (36.8 C)   Ht 5\' 7"  (1.702 m)   Wt 223 lb (101.2 kg)   SpO2 95%   BMI 34.93 kg/m   Opioid Risk Score:   Fall Risk Score:  `1  Depression screen PHQ 2/9  Depression screen [facility] 2/9 10/29/2019 10/02/2019 08/22/2019 03/18/2019 03/07/2019 12/20/2018 11/30/2017  Decreased Interest 0 0 0 0 0 0 0  Down, Depressed, Hopeless 0 0 0 0 0 0 0  PHQ - 2 Score 0 0 0 0 0 0 0  Altered sleeping - - 3 - - - -  Tired, decreased energy - - 2 - - - -  Change in appetite - - 2 - - - -  Feeling bad or failure about yourself  - - 1 - - - -  Trouble concentrating - - 1 - - - -  Moving slowly or fidgety/restless - - 0 - - - -  Suicidal thoughts - - 0 - - - -  PHQ-9 Score - - 9 - - - -  Difficult doing work/chores - - Not difficult at all - - - -  Some recent data might be hidden    Review of Systems  Musculoskeletal: Positive for gait problem.  Neurological: Positive for dizziness, tremors, weakness and numbness.       Tingling   Psychiatric/Behavioral: Positive for confusion.  All other systems reviewed and are negative.      Objective:   Physical Exam Gen: no distress, normal appearing HEENT: oral mucosa pink and moist, NCAT Cardio: Reg rate Chest: normal effort, normal rate of  breathing Abd: soft, non-distended Skin: surgical scar on spine- healing beautifully. TTP in this region. Bilateral lower extremity edema.  Neuro: Alert and oriented x3 Musculoskeletal: 4-5 strength throughout, Sensation intact.  Functional mobility: ambulating with RW.  Psych: pleasant, normal affect    Assessment & Plan:  Brandi Stephens is a 74 year old female who presents for hospital  follow-up after CIR admission post back surgery, complicated by staph infection, now on chronic antibiotics.  Diarrhea: This was a huge issue last visit and has since resolved! I had obtained an XR that showed constipation and recommended an enema. Now without diarrhea or constipation.   Impaired mobility and ADLs following spinal surgery: -Transition to outpatient PT and OT. First PT session today.  -Recommended exercise bike at home to build back endurance. Continue walking outside as well as strength and gait are improving and she is no longer limited by diarrhea.  Post-operative lower back pain: -Has decreased oxycodone use to once to twice per day. -Will provide renewal. Goal is to continue to wean. -Pain contract signed and UDS obtained. Reviewed and metabolites are as expected. PDMP reviewed.  -Continue to supplement with icing, Tyelnol, and Ibuprofen.   Anixety: Refilled Diazepam. Has been taking twice per day and this has been helping her.  Tachycardia: provided higher dose of metoprolol which she was on before last visit. HR is still slightly elevated today, but BP is low. Maintain current dose. She says BP was elevated at her doctor's appointment yesterday. It fluctuates.   Lower extremity edema: advised elevation, ice, and compression garments which she is already doing. Prescribed prn Lasix.   All questions answered. RTC in 4 weeks.

## 2019-10-31 ENCOUNTER — Other Ambulatory Visit: Payer: Self-pay | Admitting: Oncology

## 2019-11-01 ENCOUNTER — Other Ambulatory Visit: Payer: Self-pay

## 2019-11-07 MED ORDER — AMITRIPTYLINE HCL 25 MG PO TABS: 25.0000 mg | ORAL_TABLET | Freq: Every day | ORAL | 3 refills | Status: DC

## 2019-11-07 NOTE — Addendum Note (Signed)
Addended by: Izora Ribas on: 11/07/2019 04:00 PM   Modules accepted: Orders

## 2019-11-18 ENCOUNTER — Encounter: Payer: Self-pay | Admitting: Physical Therapy

## 2019-11-18 ENCOUNTER — Ambulatory Visit: Payer: PPO | Attending: Physical Medicine and Rehabilitation | Admitting: Physical Therapy

## 2019-11-18 ENCOUNTER — Other Ambulatory Visit: Payer: Self-pay

## 2019-11-18 DIAGNOSIS — M6281 Muscle weakness (generalized): Secondary | ICD-10-CM | POA: Insufficient documentation

## 2019-11-18 DIAGNOSIS — T8140XA Infection following a procedure, unspecified, initial encounter: Secondary | ICD-10-CM | POA: Insufficient documentation

## 2019-11-18 DIAGNOSIS — G8929 Other chronic pain: Secondary | ICD-10-CM | POA: Insufficient documentation

## 2019-11-18 DIAGNOSIS — M4620 Osteomyelitis of vertebra, site unspecified: Secondary | ICD-10-CM | POA: Insufficient documentation

## 2019-11-18 DIAGNOSIS — R2689 Other abnormalities of gait and mobility: Secondary | ICD-10-CM | POA: Insufficient documentation

## 2019-11-18 DIAGNOSIS — M5416 Radiculopathy, lumbar region: Secondary | ICD-10-CM

## 2019-11-18 DIAGNOSIS — M545 Low back pain: Secondary | ICD-10-CM | POA: Insufficient documentation

## 2019-11-18 NOTE — Therapy (Signed)
[place], [place], [address] Phone: [phone]   Fax:  [phone]  Physical Therapy Treatment  Patient Details  Name: Brandi Stephens MRN: [id number] Date of Birth: 10/13/1945 Referring Provider (PT): Leeroy Cha, MD   Encounter Date: 11/18/2019   PT End of Session - 11/18/19 1207    Visit Number 2    Number of Visits 12    Date for PT Re-Evaluation 12/11/19    Authorization Type [place] Medicare, progress note by visit 10    PT Start Time 1150    PT Stop Time 1230    PT Time Calculation (min) 40 min           Past Medical History:  Diagnosis Date  . AIN (anal intraepithelial neoplasia) anal canal   . Anemia    with pregnancy  . Anxiety   . Arthritis    knees, lower back, knees  . Chronic constipation   . Chronic diastolic CHF (congestive heart failure) ([place]) 07/05/2019  . CKD (chronic kidney disease)   . Coarse tremors    Essential  . COPD (chronic obstructive pulmonary disease) ([place])    2017 chest CT, history of  . Degenerative lumbar spinal stenosis   . Depression   . Depression 07/05/2019  . Drug dependency ([place])   . Elevated PTHrP level 05/09/2019  . ETOH abuse 07/05/2019  . GERD (gastroesophageal reflux disease)    pt denies  . Gout   . Grade I diastolic dysfunction 02/01/5101   Noted ECHO  . Hemorrhoids   . History of adenomatous polyp of colon   . History of basal cell carcinoma (BCC) excision 2015   nose  . History of sepsis 05/14/2014   post lumbar surgery  . History of shingles    x2  . Hyperlipidemia   . Hypertension   . Hypothyroidism   . Irregular heart beat    history of while undergoing radiation  . Malignant neoplasm of upper-inner quadrant of left breast in female, estrogen receptor positive [facility]) oncologist-  dr Jana Hakim--  per lov in epic no recurrence   dx 07-13-2015  Left breast invasive ductal carcinoma, Stage IA, Grade 1 (TXN0),  09-16-2015  s/p  left  breast lumpectomy with sln bx's,  completed radiation 11-18-2015,  started antiestrogen therapy 12-14-2015  . Neuropathy    Left foot and back of left leg  . Obesity (BMI 30-39.9) 07/05/2019  . Personal history of radiation therapy    completed 11-18-2015  left breast  . Pneumonia   . PONV (postoperative nausea and vomiting)   . Prediabetes    diet controlled, no med  . Restless leg syndrome   . Seasonal allergies   . Seizure ([place]) 05/2015   due to sepsis, only one time episode  . Tremor   . Wears partial dentures    lower     Past Surgical History:  Procedure Laterality Date  . ABDOMINAL HYSTERECTOMY  1985  . BASAL CELL CARCINOMA EXCISION  10/15   nose  . BREAST BIOPSY Right 08/24/2015  . BREAST BIOPSY Right 07/13/2015  . BREAST BIOPSY Left 07/13/2015  . BREAST EXCISIONAL BIOPSY Left 1972  . BREAST LUMPECTOMY Left   . BREAST LUMPECTOMY WITH RADIOACTIVE SEED AND SENTINEL LYMPH NODE BIOPSY Left 09/16/2015   Procedure: BREAST LUMPECTOMY WITH RADIOACTIVE SEED AND SENTINEL LYMPH NODE BIOPSY;  Surgeon: Stark Klein, MD;  Location: [place];  Service: General;  Laterality: Left;  . BREAST REDUCTION  SURGERY Bilateral 1998  . CATARACT EXTRACTION W/ INTRAOCULAR LENS IMPLANT Left 2016  . [place]   hallo  . COLONOSCOPY  01/2013   polyps  . EYE SURGERY Left 2016  . HAND SURGERY Left 02-19-2002   dr Fredna Dow  @[facility]    repair collateral ligament/ MPJ left thumb  . HARDWARE REMOVAL N/A 07/24/2019   Procedure: REVISION OF HARDWARE Lumbar two - Sacral one. Extention of Fusion to Lumbar one;  Surgeon: [name], MD;  Location: [place];  Service: Neurosurgery;  Laterality: N/A;  . HEMORRHOID SURGERY  2008  . HIGH RESOLUTION ANOSCOPY N/A 02/28/2018   Procedure: HIGH RESOLUTION ANOSCOPY WITH BIOPSY;  Surgeon: Leighton Ruff, MD;  Location: [facility];  Service: General;  Laterality: N/A;  . KNEE ARTHROSCOPY Right 2002  . LUMBAR SPINE  SURGERY  03-20-2015   dr Kathyrn Sheriff   fusion L4-5, L5-S1  . MOUTH SURGERY  07/14/2018   2 infected teeth removed  . MULTIPLE TOOTH EXTRACTIONS     with bone grafting lower bottom right and left  . REDUCTION MAMMAPLASTY Bilateral   . RIGHT COLECTOMY  09-10-2003   dr Margot Chimes @[facility]    multiple colon polyps (per path tubular adenoma's, hyperplastic , benign appendix, benign two lymph nodes  . ROTATOR CUFF REPAIR Left 04/2016  . TONSILLECTOMY  1969  . TUBAL LIGATION Bilateral 1982    There were no vitals filed for this visit.   Subjective Assessment - 11/18/19 1201    Subjective Pt reports 5/10 pain after coming down 14 steps at home and arriving at clinic.    Pertinent History L4-S1 fusion 03/20/15, L2-3 fusion 05/30/19, breast CA history 2017, seizures (due to sepsis),  lumbar hardware removal 07/24/19 with subsequent staph infection/osteomyelitis, CHF, COPD, chronic pain in pain management, history ETOH abuse, neuropathy, intention tremor                             [facility] Adult PT Treatment/Exercise - 11/18/19 0001      Ambulation/Gait   Ambulation Distance (Feet) 286 Feet   SPO2 88%    Gait velocity 2 min , 10 sec       Exercises   Exercises Lumbar      Lumbar Exercises: Aerobic   Nustep Nustep L1 x 5 minutes SPO2 92%       Lumbar Exercises: Seated   Sit to Stand 10 reps   without UE, cues for control , at mat    Other Seated Lumbar Exercises Seated scap squeeze x 10       Lumbar Exercises: Supine   Pelvic Tilt Limitations Abdominal draw in with gluteal squeeze 5 sec needs cues for breathing     Bent Knee Raise Limitations Supine marching with cues for abdominal draw in and breathing     Other Supine Lumbar Exercises review of HHPT: ankle pumps, supine hip abduction AROM , SLR x 10 each                     PT Short Term Goals - 10/30/19 1741      PT SHORT TERM GOAL #1   Title Independent with HEP    Baseline needs update from home health PT HEP     Time 3    Period Weeks    Status New    Target Date 11/20/19      PT SHORT TERM GOAL #2   Title Improve  TUG time (with RW) at least 3-5 sec for decreased fall risk    Baseline 14.7 sec    Time 3    Period Weeks    Status New    Target Date 11/20/19      PT SHORT TERM GOAL #3   Title Ambulate at least 100 feet CGA with SPC for progression to LTG for gait    Baseline uses RW    Time 3    Period Weeks    Status New    Target Date 11/20/19             PT Long Term Goals - 10/30/19 1744      PT LONG TERM GOAL #1   Title Improve [place] Gait + Balance at least 3-5 points for decreased fall risk    Baseline 19/28    Time 6    Period Weeks    Status New    Target Date 12/11/19      PT LONG TERM GOAL #2   Title Increase left hip and ankle strength at least grossly 1/2 MMT grade to improve ability to ascend/descend stairs to 2nd floor bedroom    Baseline see objective    Time 6    Period Weeks    Status New    Target Date 12/11/19      PT LONG TERM GOAL #3   Title Perform household level ambulation 150 feet or less mod I with SPC vs. LRAD    Baseline uses RW    Time 6    Period Weeks    Status New    Target Date 12/11/19      PT LONG TERM GOAL #4   Title Improve 5 times sit<>stand time to 14 sce or less for improved ability for transfers from low seats    Baseline 17.88 sec    Time 6    Period Weeks    Status New    Target Date 12/11/19      PT LONG TERM GOAL #5   Title Tolerate standing for periods at least 15-20 min for cooking and light chores with LBP 4/10 or less    Baseline tolerance limited to 5 min, pain 6/10 this PM    Time 6    Period Weeks    Status New    Target Date 12/11/19                 Plan - 11/18/19 1253    Clinical Impression Statement Pt reports compliance with HHPT. Initiated Nustep and began gait for endurance. She was limited by SOB with SPO2 dropping below 90% with exertion. She was able to ambulate 2 minutes and 10 sec at  end of session before requiring seated rest. SPO2 was 88% after ambulation. SpO2 levels returned to 95% prior to leaving the clinic.    PT Next Visit Plan NUSTEP warm up, work on building standing tolerance with back/core/LE strengthening (try Theraband row, ext, 4 in step up, mini squat) and balance/proprioceptive challenges with Airex, gait training-when able progress to trial [facility], work on "turns" with RW, mat based back and core strengthening as tolerated    PT Home Exercise Plan no time for HEP at eval-instruct as appropriate at future tx. session.           Patient will benefit from skilled therapeutic intervention in order to improve the following deficits and impairments:  Abnormal gait, Decreased balance, Decreased endurance, Difficulty walking, Impaired sensation, Pain, Impaired flexibility, Decreased  strength, Decreased activity tolerance  Visit Diagnosis: Chronic bilateral low back pain, unspecified whether sciatica present  Muscle weakness (generalized)  Other abnormalities of gait and mobility  Lumbar radiculopathy  Postoperative infection, unspecified type, initial encounter  Paraspinal abscess [facility])     Problem List Patient Active Problem List   Diagnosis Date Noted  . Insomnia 10/29/2019  . Recurrent falls 09/06/2019  . Confusion, postoperative   . Benign essential HTN   . Post-operative pain   . Chronic constipation   . 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 Post op infection 07/11/2019  . Muscle spasm   . Hypokalemia   . Status post lumbar spinal fusion 07/10/2019  . Postoperative pain   . Acute blood loss anemia   . Labile blood pressure   . Tobacco abuse   . Hypoalbuminemia due to protein-calorie malnutrition ([place])   . Anxiety 07/05/2019  . Depression 07/05/2019  . Chronic diastolic CHF (congestive heart failure) ([place]) 07/05/2019  . Obesity (BMI 30-39.9) 07/05/2019  . ETOH abuse 07/05/2019  . Lumbar radiculopathy 06/28/2019  . Spinal stenosis of  lumbar region 06/28/2019  . Acute on chronic anemia   . Tremors of nervous system   . AKI (acute kidney injury) ([place])   . Chronic pain syndrome   . Acute renal failure superimposed on stage 3 chronic kidney disease ([place]) 06/25/2019  . Acute osteomyelitis of lumbar spine ([place]) 06/25/2019  . Weakness of left lower extremity   . Abdominal pain 06/24/2019  . Nausea 06/24/2019  . Abnormal urine odor 06/24/2019  . History of sepsis 06/24/2019  . Recent major surgery 06/24/2019  . History of diverticulosis 06/24/2019  . Elevated PTHrP level 05/09/2019  . Paresthesia 02/12/2019  . Low back pain 02/12/2019  . Neuropathy 12/20/2018  . Lumbar spinal stenosis 06/29/2018  . Preoperative clearance 06/04/2018  . Abnormal CT of the chest 06/04/2018  . Smoker 05/17/2018  . Hypothyroidism 11/30/2017  . Thoracic aortic atherosclerosis ([place]) 09/14/2016  . Benign essential tremor 09/09/2016  . RLS (restless legs syndrome) 09/09/2016  . H/O adenomatous polyp of colon 07/06/2016  . PVC's (premature ventricular contractions) 03/29/2016  . Preoperative cardiovascular examination 03/29/2016  . Grade I diastolic dysfunction 67/20/9470  . Palpitations 01/11/2016  . Shortness of breath 01/11/2016  . Former cigarette smoker 01/11/2016  . Leg edema 01/11/2016  . Chronic cough 10/28/2015  . 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 Malignant neoplasm of upper-inner quadrant of left breast in female, estrogen receptor positive ([place]) 07/14/2015  . Prediabetes 07/08/2015  . CKD (chronic kidney disease), stage III 07/08/2015  . Dependent edema 06/08/2015  . Sleep disturbance 06/08/2015  . Mixed hyperlipidemia 06/08/2015  . Generalized anxiety disorder 06/08/2015  . Essential hypertension 06/08/2015  . COPD (chronic obstructive pulmonary disease) ([place]) 06/08/2015  . Gout 06/08/2015  . Macrocytic anemia   . 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 Sepsis ([place]) 05/15/2015  . Lumbar spondylosis 03/20/2015     Dorene Ar, PTA 11/18/2019, 1:06 PM  [facility] [address] [place], [place], [address] Phone: [phone]   Fax:  [phone]  Name: Brandi Stephens MRN: [id number] Date of Birth: October 22, 1945

## 2019-11-21 ENCOUNTER — Ambulatory Visit: Payer: PPO | Admitting: Physical Therapy

## 2019-11-26 ENCOUNTER — Other Ambulatory Visit: Payer: Self-pay

## 2019-11-26 ENCOUNTER — Encounter: Payer: Self-pay | Admitting: Physical Therapy

## 2019-11-26 ENCOUNTER — Ambulatory Visit: Payer: PPO | Admitting: Physical Therapy

## 2019-11-26 ENCOUNTER — Other Ambulatory Visit: Payer: Self-pay | Admitting: Family Medicine

## 2019-11-26 DIAGNOSIS — M545 Low back pain, unspecified: Secondary | ICD-10-CM

## 2019-11-26 DIAGNOSIS — G8929 Other chronic pain: Secondary | ICD-10-CM

## 2019-11-26 DIAGNOSIS — T8140XA Infection following a procedure, unspecified, initial encounter: Secondary | ICD-10-CM

## 2019-11-26 DIAGNOSIS — R2689 Other abnormalities of gait and mobility: Secondary | ICD-10-CM

## 2019-11-26 DIAGNOSIS — M6281 Muscle weakness (generalized): Secondary | ICD-10-CM

## 2019-11-26 DIAGNOSIS — M5416 Radiculopathy, lumbar region: Secondary | ICD-10-CM

## 2019-11-26 DIAGNOSIS — G2581 Restless legs syndrome: Secondary | ICD-10-CM

## 2019-11-26 DIAGNOSIS — M4620 Osteomyelitis of vertebra, site unspecified: Secondary | ICD-10-CM

## 2019-11-26 NOTE — Telephone Encounter (Signed)
Ok to refill 

## 2019-11-26 NOTE — Therapy (Signed)
[place], [place], [address] Phone: [phone]   Fax:  [phone]  Physical Therapy Treatment  Patient Details  Name: Brandi Stephens MRN: [id number] Date of Birth: 1946/01/30 Referring Provider (PT): Leeroy Cha, MD   Encounter Date: 11/26/2019   PT End of Session - 11/26/19 1239    Visit Number 3    Number of Visits 12    Date for PT Re-Evaluation 12/11/19    Authorization Type [employer] Medicare, progress note by visit 10           Past Medical History:  Diagnosis Date  . AIN (anal intraepithelial neoplasia) anal canal   . Anemia    with pregnancy  . Anxiety   . Arthritis    knees, lower back, knees  . Chronic constipation   . Chronic diastolic CHF (congestive heart failure) ([place]) 07/05/2019  . CKD (chronic kidney disease)   . Coarse tremors    Essential  . COPD (chronic obstructive pulmonary disease) ([place])    2017 chest CT, history of  . Degenerative lumbar spinal stenosis   . Depression   . Depression 07/05/2019  . Drug dependency ([place])   . Elevated PTHrP level 05/09/2019  . ETOH abuse 07/05/2019  . GERD (gastroesophageal reflux disease)    pt denies  . Gout   . Grade I diastolic dysfunction 49/67/5916   Noted ECHO  . Hemorrhoids   . History of adenomatous polyp of colon   . History of basal cell carcinoma (BCC) excision 2015   nose  . History of sepsis 05/14/2014   post lumbar surgery  . History of shingles    x2  . Hyperlipidemia   . Hypertension   . Hypothyroidism   . Irregular heart beat    history of while undergoing radiation  . Malignant neoplasm of upper-inner quadrant of left breast in female, estrogen receptor positive [facility]) oncologist-  dr Jana Hakim--  per lov in epic no recurrence   dx 07-13-2015  Left breast invasive ductal carcinoma, Stage IA, Grade 1 (TXN0),  09-16-2015  s/p  left breast lumpectomy with sln bx's,  completed radiation 11-18-2015,  started  antiestrogen therapy 12-14-2015  . Neuropathy    Left foot and back of left leg  . Obesity (BMI 30-39.9) 07/05/2019  . Personal history of radiation therapy    completed 11-18-2015  left breast  . Pneumonia   . PONV (postoperative nausea and vomiting)   . Prediabetes    diet controlled, no med  . Restless leg syndrome   . Seasonal allergies   . Seizure ([place]) 05/2015   due to sepsis, only one time episode  . Tremor   . Wears partial dentures    lower     Past Surgical History:  Procedure Laterality Date  . ABDOMINAL HYSTERECTOMY  1985  . BASAL CELL CARCINOMA EXCISION  10/15   nose  . BREAST BIOPSY Right 08/24/2015  . BREAST BIOPSY Right 07/13/2015  . BREAST BIOPSY Left 07/13/2015  . BREAST EXCISIONAL BIOPSY Left 1972  . BREAST LUMPECTOMY Left   . BREAST LUMPECTOMY WITH RADIOACTIVE SEED AND SENTINEL LYMPH NODE BIOPSY Left 09/16/2015   Procedure: BREAST LUMPECTOMY WITH RADIOACTIVE SEED AND SENTINEL LYMPH NODE BIOPSY;  Surgeon: Stark Klein, MD;  Location: [facility];  Service: General;  Laterality: Left;  . BREAST REDUCTION SURGERY Bilateral 1998  . CATARACT EXTRACTION W/ INTRAOCULAR LENS IMPLANT Left 2016  . [place]   hallo  .  COLONOSCOPY  01/2013   polyps  . EYE SURGERY Left 2016  . HAND SURGERY Left 02-19-2002   dr Fredna Dow  @[facility]    repair collateral ligament/ MPJ left thumb  . HARDWARE REMOVAL N/A 07/24/2019   Procedure: REVISION OF HARDWARE Lumbar two - Sacral one. Extention of Fusion to Lumbar one;  Surgeon: [name], MD;  Location: [facility];  Service: Neurosurgery;  Laterality: N/A;  . HEMORRHOID SURGERY  2008  . HIGH RESOLUTION ANOSCOPY N/A 02/28/2018   Procedure: HIGH RESOLUTION ANOSCOPY WITH BIOPSY;  Surgeon: Leighton Ruff, MD;  Location: [facility];  Service: General;  Laterality: N/A;  . KNEE ARTHROSCOPY Right 2002  . LUMBAR SPINE SURGERY  03-20-2015   dr Kathyrn Sheriff   fusion L4-5, L5-S1  . MOUTH SURGERY   07/14/2018   2 infected teeth removed  . MULTIPLE TOOTH EXTRACTIONS     with bone grafting lower bottom right and left  . REDUCTION MAMMAPLASTY Bilateral   . RIGHT COLECTOMY  09-10-2003   dr Margot Chimes @[facility]    multiple colon polyps (per path tubular adenoma's, hyperplastic , benign appendix, benign two lymph nodes  . ROTATOR CUFF REPAIR Left 04/2016  . TONSILLECTOMY  1969  . TUBAL LIGATION Bilateral 1982    There were no vitals filed for this visit.   Subjective Assessment - 11/26/19 1239    Subjective Feet are numb up to my calves and it started on Thursday. I called my neurosurgeon but I might need to call my neurologist.    Pertinent History L4-S1 fusion 03/20/15, L2-3 fusion 05/30/19, breast CA history 2017, seizures (due to sepsis),  lumbar hardware removal 07/24/19 with subsequent staph infection/osteomyelitis, CHF, COPD, chronic pain in pain management, history ETOH abuse, neuropathy, intention tremor    Limitations Sitting;House hold activities;Lifting;Standing;Walking    Currently in Pain? Yes    Pain Score 6     Pain Location Back    Pain Orientation Lower    Pain Descriptors / Indicators Aching;Stabbing    Pain Type Chronic pain    Aggravating Factors  prolonged sitting or standing and walking    Pain Relieving Factors ice, meds                             [facility] Adult PT Treatment/Exercise - 11/26/19 0001      Ambulation/Gait   Ambulation Distance (Feet) 335 Feet   2 min 36 seconds    Assistive device Rolling walker      Lumbar Exercises: Aerobic   Nustep Nustep L1 x 7 minutes SPO2 92%    SP02 92%      Lumbar Exercises: Standing   Heel Raises 10 reps    Heel Raises Limitations toe raises     Other Standing Lumbar Exercises hip flexion x 10 each with cues for posture   c/ o dizziness, SPO2 89% and returned to 94% with rest     Lumbar Exercises: Seated   Sit to Stand 10 reps   without UE, cues for control , at mat    Other Seated Lumbar Exercises seated  toe raises,     Other Seated Lumbar Exercises Seated scap squeeze x 10       Lumbar Exercises: Supine   Pelvic Tilt Limitations Abdominal draw in with gluteal squeeze 5 sec needs cues for breathing     Large Ball Abdominal Isometric 10 reps    Large Ball Abdominal Isometric Limitations used to cue for abdominal  and gluteal contraction                     PT Short Term Goals - 10/30/19 1741      PT SHORT TERM GOAL #1   Title Independent with HEP    Baseline needs update from home health PT HEP    Time 3    Period Weeks    Status New    Target Date 11/20/19      PT SHORT TERM GOAL #2   Title Improve TUG time (with RW) at least 3-5 sec for decreased fall risk    Baseline 14.7 sec    Time 3    Period Weeks    Status New    Target Date 11/20/19      PT SHORT TERM GOAL #3   Title Ambulate at least 100 feet CGA with SPC for progression to LTG for gait    Baseline uses RW    Time 3    Period Weeks    Status New    Target Date 11/20/19             PT Long Term Goals - 10/30/19 1744      PT LONG TERM GOAL #1   Title Improve [place] Gait + Balance at least 3-5 points for decreased fall risk    Baseline 19/28    Time 6    Period Weeks    Status New    Target Date 12/11/19      PT LONG TERM GOAL #2   Title Increase left hip and ankle strength at least grossly 1/2 MMT grade to improve ability to ascend/descend stairs to 2nd floor bedroom    Baseline see objective    Time 6    Period Weeks    Status New    Target Date 12/11/19      PT LONG TERM GOAL #3   Title Perform household level ambulation 150 feet or less mod I with SPC vs. LRAD    Baseline uses RW    Time 6    Period Weeks    Status New    Target Date 12/11/19      PT LONG TERM GOAL #4   Title Improve 5 times sit<>stand time to 14 sce or less for improved ability for transfers from low seats    Baseline 17.88 sec    Time 6    Period Weeks    Status New    Target Date 12/11/19      PT LONG  TERM GOAL #5   Title Tolerate standing for periods at least 15-20 min for cooking and light chores with LBP 4/10 or less    Baseline tolerance limited to 5 min, pain 6/10 this PM    Time 6    Period Weeks    Status New    Target Date 12/11/19                 Plan - 11/26/19 1325    Clinical Impression Statement Pt reports pain with some of her HHPT exercises and bed mobility. Review of bed mobility revealed over extension of spine while bridging to scoot and turn over in bed. Time spent reviewing abdominal activation via verbal cues and demonstration. Exercise ball used for isometric abdominal contraction training and she was able to demonstrate without the ball afterward. Decreased pain with bed mobility following. Increased gait distance today and cues given for decreased speed, increased heel stike and  erect posture. She continues to be limited by SOB. She may benefit from evaluation by MD due to frequent drop in oxygen saturation and this was discussed with patient and her daughter at end of session.    PT Next Visit Plan NUSTEP warm up, work on building standing tolerance with back/core/LE strengthening (try Theraband row, ext, 4 in step up, mini squat) and balance/proprioceptive challenges with Airex, gait training-when able progress to trial [facility], work on "turns" with RW, mat based back and core strengthening as tolerated    PT Home Exercise Plan no time for HEP at eval-instruct as appropriate at future tx. session.           Patient will benefit from skilled therapeutic intervention in order to improve the following deficits and impairments:  Abnormal gait, Decreased balance, Decreased endurance, Difficulty walking, Impaired sensation, Pain, Impaired flexibility, Decreased strength, Decreased activity tolerance  Visit Diagnosis: Chronic bilateral low back pain, unspecified whether sciatica present  Muscle weakness (generalized)  Other abnormalities of gait and  mobility  Lumbar radiculopathy  Postoperative infection, unspecified type, initial encounter  Paraspinal abscess [facility])     Problem List Patient Active Problem List   Diagnosis Date Noted  . Insomnia 10/29/2019  . Recurrent falls 09/06/2019  . Confusion, postoperative   . Benign essential HTN   . Post-operative pain   . Chronic constipation   . 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 Post op infection 07/11/2019  . Muscle spasm   . Hypokalemia   . Status post lumbar spinal fusion 07/10/2019  . Postoperative pain   . Acute blood loss anemia   . Labile blood pressure   . Tobacco abuse   . Hypoalbuminemia due to protein-calorie malnutrition ([place])   . Anxiety 07/05/2019  . Depression 07/05/2019  . Chronic diastolic CHF (congestive heart failure) ([place]) 07/05/2019  . Obesity (BMI 30-39.9) 07/05/2019  . ETOH abuse 07/05/2019  . Lumbar radiculopathy 06/28/2019  . Spinal stenosis of lumbar region 06/28/2019  . Acute on chronic anemia   . Tremors of nervous system   . AKI (acute kidney injury) ([place])   . Chronic pain syndrome   . Acute renal failure superimposed on stage 3 chronic kidney disease ([place]) 06/25/2019  . Acute osteomyelitis of lumbar spine ([place]) 06/25/2019  . Weakness of left lower extremity   . Abdominal pain 06/24/2019  . Nausea 06/24/2019  . Abnormal urine odor 06/24/2019  . History of sepsis 06/24/2019  . Recent major surgery 06/24/2019  . History of diverticulosis 06/24/2019  . Elevated PTHrP level 05/09/2019  . Paresthesia 02/12/2019  . Low back pain 02/12/2019  . Neuropathy 12/20/2018  . Lumbar spinal stenosis 06/29/2018  . Preoperative clearance 06/04/2018  . Abnormal CT of the chest 06/04/2018  . Smoker 05/17/2018  . Hypothyroidism 11/30/2017  . Thoracic aortic atherosclerosis ([place]) 09/14/2016  . Benign essential tremor 09/09/2016  . RLS (restless legs syndrome) 09/09/2016  . H/O adenomatous polyp of colon 07/06/2016  . PVC's (premature  ventricular contractions) 03/29/2016  . Preoperative cardiovascular examination 03/29/2016  . Grade I diastolic dysfunction 56/31/4970  . Palpitations 01/11/2016  . Shortness of breath 01/11/2016  . Former cigarette smoker 01/11/2016  . Leg edema 01/11/2016  . Chronic cough 10/28/2015  . 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 Malignant neoplasm of upper-inner quadrant of left breast in female, estrogen receptor positive ([place]) 07/14/2015  . Prediabetes 07/08/2015  . CKD (chronic kidney disease), stage III 07/08/2015  . Dependent edema 06/08/2015  . Sleep disturbance 06/08/2015  .  Mixed hyperlipidemia 06/08/2015  . Generalized anxiety disorder 06/08/2015  . Essential hypertension 06/08/2015  . COPD (chronic obstructive pulmonary disease) ([place]) 06/08/2015  . Gout 06/08/2015  . Macrocytic anemia   . 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 Sepsis ([place]) 05/15/2015  . Lumbar spondylosis 03/20/2015    Dorene Ar, PTA 11/26/2019, 1:31 PM  [facility] [address] [place], [place], [address] Phone: [phone]   Fax:  [phone]  Name: Brandi Stephens MRN: [id number] Date of Birth: April 09, 1946

## 2019-11-28 ENCOUNTER — Other Ambulatory Visit: Payer: Self-pay

## 2019-11-28 ENCOUNTER — Encounter: Payer: Self-pay | Admitting: Physical Therapy

## 2019-11-28 ENCOUNTER — Ambulatory Visit: Payer: PPO | Admitting: Physical Therapy

## 2019-11-28 ENCOUNTER — Other Ambulatory Visit: Payer: Self-pay | Admitting: Physical Medicine and Rehabilitation

## 2019-11-28 VITALS — BP 146/84

## 2019-11-28 DIAGNOSIS — M6281 Muscle weakness (generalized): Secondary | ICD-10-CM

## 2019-11-28 DIAGNOSIS — G8929 Other chronic pain: Secondary | ICD-10-CM

## 2019-11-28 DIAGNOSIS — R2689 Other abnormalities of gait and mobility: Secondary | ICD-10-CM

## 2019-11-28 DIAGNOSIS — M545 Low back pain, unspecified: Secondary | ICD-10-CM

## 2019-11-28 NOTE — Therapy (Signed)
[place], [place], [address] Phone: [phone]   Fax:  [phone]  Physical Therapy Treatment  Patient Details  Name: Brandi Stephens MRN: [id number] Date of Birth: June 10, 1945 Referring Provider (PT): Leeroy Cha, MD   Encounter Date: 11/28/2019   PT End of Session - 11/28/19 1606    Visit Number 4    Number of Visits 12    Date for PT Re-Evaluation 12/11/19    Authorization Type [place] Medicare, progress note by visit 10    PT Start Time 1502    PT Stop Time 1547    PT Time Calculation (min) 45 min    Equipment Utilized During Treatment Gait belt   SPC and RW   Activity Tolerance Patient limited by fatigue    Behavior During Therapy [facility] for tasks assessed/performed           Past Medical History:  Diagnosis Date  . AIN (anal intraepithelial neoplasia) anal canal   . Anemia    with pregnancy  . Anxiety   . Arthritis    knees, lower back, knees  . Chronic constipation   . Chronic diastolic CHF (congestive heart failure) ([place]) 07/05/2019  . CKD (chronic kidney disease)   . Coarse tremors    Essential  . COPD (chronic obstructive pulmonary disease) ([place])    2017 chest CT, history of  . Degenerative lumbar spinal stenosis   . Depression   . Depression 07/05/2019  . Drug dependency ([place])   . Elevated PTHrP level 05/09/2019  . ETOH abuse 07/05/2019  . GERD (gastroesophageal reflux disease)    pt denies  . Gout   . Grade I diastolic dysfunction 73/71/0626   Noted ECHO  . Hemorrhoids   . History of adenomatous polyp of colon   . History of basal cell carcinoma (BCC) excision 2015   nose  . History of sepsis 05/14/2014   post lumbar surgery  . History of shingles    x2  . Hyperlipidemia   . Hypertension   . Hypothyroidism   . Irregular heart beat    history of while undergoing radiation  . Malignant neoplasm of upper-inner quadrant of left breast in female, estrogen receptor positive  [facility]) oncologist-  dr Jana Hakim--  per lov in epic no recurrence   dx 07-13-2015  Left breast invasive ductal carcinoma, Stage IA, Grade 1 (TXN0),  09-16-2015  s/p  left breast lumpectomy with sln bx's,  completed radiation 11-18-2015,  started antiestrogen therapy 12-14-2015  . Neuropathy    Left foot and back of left leg  . Obesity (BMI 30-39.9) 07/05/2019  . Personal history of radiation therapy    completed 11-18-2015  left breast  . Pneumonia   . PONV (postoperative nausea and vomiting)   . Prediabetes    diet controlled, no med  . Restless leg syndrome   . Seasonal allergies   . Seizure ([place]) 05/2015   due to sepsis, only one time episode  . Tremor   . Wears partial dentures    lower     Past Surgical History:  Procedure Laterality Date  . ABDOMINAL HYSTERECTOMY  1985  . BASAL CELL CARCINOMA EXCISION  10/15   nose  . BREAST BIOPSY Right 08/24/2015  . BREAST BIOPSY Right 07/13/2015  . BREAST BIOPSY Left 07/13/2015  . BREAST EXCISIONAL BIOPSY Left 1972  . BREAST LUMPECTOMY Left   . BREAST LUMPECTOMY WITH RADIOACTIVE SEED AND SENTINEL LYMPH NODE BIOPSY Left 09/16/2015  Procedure: BREAST LUMPECTOMY WITH RADIOACTIVE SEED AND SENTINEL LYMPH NODE BIOPSY;  Surgeon: Stark Klein, MD;  Location: [place];  Service: General;  Laterality: Left;  . BREAST REDUCTION SURGERY Bilateral 1998  . CATARACT EXTRACTION W/ INTRAOCULAR LENS IMPLANT Left 2016  . [place]   hallo  . COLONOSCOPY  01/2013   polyps  . EYE SURGERY Left 2016  . HAND SURGERY Left 02-19-2002   dr Fredna Dow  @[facility]    repair collateral ligament/ MPJ left thumb  . HARDWARE REMOVAL N/A 07/24/2019   Procedure: REVISION OF HARDWARE Lumbar two - Sacral one. Extention of Fusion to Lumbar one;  Surgeon: [name], MD;  Location: [facility];  Service: Neurosurgery;  Laterality: N/A;  . HEMORRHOID SURGERY  2008  . HIGH RESOLUTION ANOSCOPY N/A 02/28/2018   Procedure: HIGH RESOLUTION ANOSCOPY  WITH BIOPSY;  Surgeon: Leighton Ruff, MD;  Location: [facility];  Service: General;  Laterality: N/A;  . KNEE ARTHROSCOPY Right 2002  . LUMBAR SPINE SURGERY  03-20-2015   dr Kathyrn Sheriff   fusion L4-5, L5-S1  . MOUTH SURGERY  07/14/2018   2 infected teeth removed  . MULTIPLE TOOTH EXTRACTIONS     with bone grafting lower bottom right and left  . REDUCTION MAMMAPLASTY Bilateral   . RIGHT COLECTOMY  09-10-2003   dr Margot Chimes @[facility]    multiple colon polyps (per path tubular adenoma's, hyperplastic , benign appendix, benign two lymph nodes  . ROTATOR CUFF REPAIR Left 04/2016  . TONSILLECTOMY  1969  . TUBAL LIGATION Bilateral 1982    Vitals:   11/28/19 1557  BP: (!) 146/84  SpO2: 96%     Subjective Assessment - 11/28/19 1557    Subjective Pt. reports walking some at home without her walker and feels like she is getting stronger. Moderate back pain 5/10 pre-tx.    Patient is accompained by: Family member   daughter drove pt. to appointment   Pertinent History L4-S1 fusion 03/20/15, L2-3 fusion 05/30/19, breast CA history 2017, seizures (due to sepsis),  lumbar hardware removal 07/24/19 with subsequent staph infection/osteomyelitis, CHF, COPD, chronic pain in pain management, history ETOH abuse, neuropathy, intention tremor    Limitations Sitting;House hold activities;Lifting;Standing;Walking    Currently in Pain? Yes    Pain Score 5     Pain Location Back    Pain Orientation Lower    Pain Descriptors / Indicators Aching;Stabbing    Pain Type Chronic pain    Pain Onset More than a month ago    Pain Frequency Constant    Aggravating Factors  prolonged sitting, standing and walking    Pain Relieving Factors ice, medication    Effect of Pain on Daily Activities limits positional and activity tolerance                             [facility] Adult PT Treatment/Exercise - 11/28/19 0001      Ambulation/Gait   Ambulation Distance (Feet) --   see comments   Assistive  device Straight cane    Gait Comments Gait training with SPC today (cane in RUE) with instruction and cueing for initial step-to gait pattern leading with left LE progressed to step-through pattern with cues for sequencing. Pt. able to ambulate 40 feet followed by brief standing rest then 30 feet with seated rest followed by 30 feet x 2 all with CGA and cues as noted      Lumbar Exercises:  Aerobic   Nustep L2 x 5 min UE/LE      Lumbar Exercises: Standing   Heel Raises 15 reps    Heel Raises Limitations on Airex    Functional Squats Limitations "Mini" squat in RW with chair behind pt. for safety and cueing-mod cues to reach hips back and avoid knee flexion past toes    Other Standing Lumbar Exercises Airex marches x 15 reps at counter    Other Standing Lumbar Exercises step ups using 4 in. step with RW x 10 ea. bilat. with CGA      Lumbar Exercises: Supine   AB Set Limitations abdominal bracing/isometric with bilat. shoulder ext x 15 reps    Bent Knee Raise 15 reps    Other Supine Lumbar Exercises hip adduction isometric with small ball x 15 reps                  PT Education - 11/28/19 1606    Education Details gait training, POC    Person(s) Educated Patient    Methods Explanation;Demonstration;Tactile cues;Verbal cues    Comprehension Verbalized understanding;Returned demonstration;Verbal cues required;Need further instruction            PT Short Term Goals - 10/30/19 1741      PT SHORT TERM GOAL #1   Title Independent with HEP    Baseline needs update from home health PT HEP    Time 3    Period Weeks    Status New    Target Date 11/20/19      PT SHORT TERM GOAL #2   Title Improve TUG time (with RW) at least 3-5 sec for decreased fall risk    Baseline 14.7 sec    Time 3    Period Weeks    Status New    Target Date 11/20/19      PT SHORT TERM GOAL #3   Title Ambulate at least 100 feet CGA with SPC for progression to LTG for gait    Baseline uses RW    Time  3    Period Weeks    Status New    Target Date 11/20/19             PT Long Term Goals - 10/30/19 1744      PT LONG TERM GOAL #1   Title Improve [place] Gait + Balance at least 3-5 points for decreased fall risk    Baseline 19/28    Time 6    Period Weeks    Status New    Target Date 12/11/19      PT LONG TERM GOAL #2   Title Increase left hip and ankle strength at least grossly 1/2 MMT grade to improve ability to ascend/descend stairs to 2nd floor bedroom    Baseline see objective    Time 6    Period Weeks    Status New    Target Date 12/11/19      PT LONG TERM GOAL #3   Title Perform household level ambulation 150 feet or less mod I with SPC vs. LRAD    Baseline uses RW    Time 6    Period Weeks    Status New    Target Date 12/11/19      PT LONG TERM GOAL #4   Title Improve 5 times sit<>stand time to 14 sce or less for improved ability for transfers from low seats    Baseline 17.88 sec    Time 6  Period Weeks    Status New    Target Date 12/11/19      PT LONG TERM GOAL #5   Title Tolerate standing for periods at least 15-20 min for cooking and light chores with LBP 4/10 or less    Baseline tolerance limited to 5 min, pain 6/10 this PM    Time 6    Period Weeks    Status New    Target Date 12/11/19                 Plan - 11/28/19 1608    Clinical Impression Statement Progressed today with gait training with SPC with pt. able to ambulate distances as noted per flowsheet with cues for sequencing for step-to and step-through gait pattern with CGA. Walking/standing tolerance limited due to fatigue with frequent rest breaks required. Monitored vitals with pulse range and BP as noted. O2 initially 96% on room air-recheked throughout session with brief drops as low as 88% during exercises with associated SOB but able to recover with saturations returning >90% with rest breaks and cues for breathing. Progress re: therapy goals ongoing.    Personal Factors and  Comorbidities Comorbidity 3+;Time since onset of injury/illness/exacerbation;Age    Comorbidities see PMH    Examination-Activity Limitations Sit;Dressing;Bathing;Transfers;Sleep;Hygiene/Grooming;Bed Mobility;Bend;Lift;Squat;Stairs;Locomotion Level;Carry;Stand;Continence;Toileting    Examination-Participation Restrictions Community Activity;Shop;Cleaning;Laundry;Meal Prep    Stability/Clinical Decision Making Evolving/Moderate complexity    Clinical Decision Making Moderate    Rehab Potential Good    PT Frequency 2x / week    PT Duration 6 weeks    PT Treatment/Interventions ADLs/Self Care Home Management;Cryotherapy;Moist Heat;Therapeutic activities;Gait training;Therapeutic exercise;Patient/family education;Manual techniques;Stair training;Functional mobility training;Balance training;Neuromuscular re-education;Taping    PT Next Visit Plan Monitor vitals as needed, continue gait training with SPC, continue exercise and functional activity progression as tolerated with standing/closed chain strengthening    Consulted and Agree with Plan of Care Patient;Family member/caregiver           Patient will benefit from skilled therapeutic intervention in order to improve the following deficits and impairments:  Abnormal gait, Decreased balance, Decreased endurance, Difficulty walking, Impaired sensation, Pain, Impaired flexibility, Decreased strength, Decreased activity tolerance  Visit Diagnosis: Chronic bilateral low back pain, unspecified whether sciatica present  Muscle weakness (generalized)  Other abnormalities of gait and mobility     Problem List Patient Active Problem List   Diagnosis Date Noted  . Insomnia 10/29/2019  . Recurrent falls 09/06/2019  . Confusion, postoperative   . Benign essential HTN   . Post-operative pain   . Chronic constipation   . 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 Post op infection 07/11/2019  . Muscle spasm   . Hypokalemia   . Status post lumbar  spinal fusion 07/10/2019  . Postoperative pain   . Acute blood loss anemia   . Labile blood pressure   . Tobacco abuse   . Hypoalbuminemia due to protein-calorie malnutrition ([place])   . Anxiety 07/05/2019  . Depression 07/05/2019  . Chronic diastolic CHF (congestive heart failure) ([place]) 07/05/2019  . Obesity (BMI 30-39.9) 07/05/2019  . ETOH abuse 07/05/2019  . Lumbar radiculopathy 06/28/2019  . Spinal stenosis of lumbar region 06/28/2019  . Acute on chronic anemia   . Tremors of nervous system   . AKI (acute kidney injury) ([place])   . Chronic pain syndrome   . Acute renal failure superimposed on stage 3 chronic kidney disease ([place]) 06/25/2019  . Acute osteomyelitis of lumbar spine ([place]) 06/25/2019  . Weakness of left lower extremity   .  Abdominal pain 06/24/2019  . Nausea 06/24/2019  . Abnormal urine odor 06/24/2019  . History of sepsis 06/24/2019  . Recent major surgery 06/24/2019  . History of diverticulosis 06/24/2019  . Elevated PTHrP level 05/09/2019  . Paresthesia 02/12/2019  . Low back pain 02/12/2019  . Neuropathy 12/20/2018  . Lumbar spinal stenosis 06/29/2018  . Preoperative clearance 06/04/2018  . Abnormal CT of the chest 06/04/2018  . Smoker 05/17/2018  . Hypothyroidism 11/30/2017  . Thoracic aortic atherosclerosis ([place]) 09/14/2016  . Benign essential tremor 09/09/2016  . RLS (restless legs syndrome) 09/09/2016  . H/O adenomatous polyp of colon 07/06/2016  . PVC's (premature ventricular contractions) 03/29/2016  . Preoperative cardiovascular examination 03/29/2016  . Grade I diastolic dysfunction 63/04/6008  . Palpitations 01/11/2016  . Shortness of breath 01/11/2016  . Former cigarette smoker 01/11/2016  . Leg edema 01/11/2016  . Chronic cough 10/28/2015  . 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 Malignant neoplasm of upper-inner quadrant of left breast in female, estrogen receptor positive ([place]) 07/14/2015  . Prediabetes 07/08/2015  . CKD (chronic kidney  disease), stage III 07/08/2015  . Dependent edema 06/08/2015  . Sleep disturbance 06/08/2015  . Mixed hyperlipidemia 06/08/2015  . Generalized anxiety disorder 06/08/2015  . Essential hypertension 06/08/2015  . COPD (chronic obstructive pulmonary disease) ([place]) 06/08/2015  . Gout 06/08/2015  . Macrocytic anemia   . 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 Sepsis ([place]) 05/15/2015  . Lumbar spondylosis 03/20/2015    Beaulah Dinning, PT, DPT 11/28/19 4:14 PM  [facility] [address] [place], [place], [address] Phone: [phone]   Fax:  [phone]  Name: Brandi Stephens MRN: [id number] Date of Birth: 16-Mar-1946

## 2019-12-02 ENCOUNTER — Ambulatory Visit: Payer: PPO | Admitting: Physical Therapy

## 2019-12-02 ENCOUNTER — Encounter: Payer: Self-pay | Admitting: Physical Therapy

## 2019-12-02 ENCOUNTER — Other Ambulatory Visit: Payer: Self-pay

## 2019-12-02 VITALS — BP 148/96 | HR 104

## 2019-12-02 DIAGNOSIS — R2689 Other abnormalities of gait and mobility: Secondary | ICD-10-CM

## 2019-12-02 DIAGNOSIS — M545 Low back pain: Secondary | ICD-10-CM | POA: Diagnosis not present

## 2019-12-02 DIAGNOSIS — M6281 Muscle weakness (generalized): Secondary | ICD-10-CM

## 2019-12-02 DIAGNOSIS — G8929 Other chronic pain: Secondary | ICD-10-CM

## 2019-12-02 NOTE — Therapy (Signed)
[facility] [place], [place], [address] Phone: [phone]   Fax:  [phone]  Physical Therapy Treatment  Patient Details  Name: Brandi Stephens MRN: [id number] Date of Birth: [birth year] Referring Provider (PT): Leeroy Cha, MD   Encounter Date: 12/02/2019   PT End of Session - 12/02/19 1720    Visit Number 5    Number of Visits 12    Date for PT Re-Evaluation 12/11/19    Authorization Type [place] Medicare, progress note by visit 10    PT Start Time 1718    PT Stop Time 1802    PT Time Calculation (min) 44 min    Equipment Utilized During Treatment Gait belt;Oxygen    Activity Tolerance Patient limited by fatigue;Patient limited by pain    Behavior During Therapy [facility] for tasks assessed/performed           Past Medical History:  Diagnosis Date  . AIN (anal intraepithelial neoplasia) anal canal   . Anemia    with pregnancy  . Anxiety   . Arthritis    knees, lower back, knees  . Chronic constipation   . Chronic diastolic CHF (congestive heart failure) ([place]) 07/05/2019  . CKD (chronic kidney disease)   . Coarse tremors    Essential  . COPD (chronic obstructive pulmonary disease) ([place])    2017 chest CT, history of  . Degenerative lumbar spinal stenosis   . Depression   . Depression 07/05/2019  . Drug dependency ([place])   . Elevated PTHrP level 05/09/2019  . ETOH abuse 07/05/2019  . GERD (gastroesophageal reflux disease)    pt denies  . Gout   . Grade I diastolic dysfunction 19/62/2297   Noted ECHO  . Hemorrhoids   . History of adenomatous polyp of colon   . History of basal cell carcinoma (BCC) excision 2015   nose  . History of sepsis 05/14/2014   post lumbar surgery  . History of shingles    x2  . Hyperlipidemia   . Hypertension   . Hypothyroidism   . Irregular heart beat    history of while undergoing radiation  . Malignant neoplasm of upper-inner quadrant of left breast in female, estrogen  receptor positive [facility]) oncologist-  dr Jana Hakim--  per lov in epic no recurrence   dx 07-13-2015  Left breast invasive ductal carcinoma, Stage IA, Grade 1 (TXN0),  09-16-2015  s/p  left breast lumpectomy with sln bx's,  completed radiation 11-18-2015,  started antiestrogen therapy 12-14-2015  . Neuropathy    Left foot and back of left leg  . Obesity (BMI 30-39.9) 07/05/2019  . Personal history of radiation therapy    completed 11-18-2015  left breast  . Pneumonia   . PONV (postoperative nausea and vomiting)   . Prediabetes    diet controlled, no med  . Restless leg syndrome   . Seasonal allergies   . Seizure ([place]) 05/2015   due to sepsis, only one time episode  . Tremor   . Wears partial dentures    lower     Past Surgical History:  Procedure Laterality Date  . ABDOMINAL HYSTERECTOMY  1985  . BASAL CELL CARCINOMA EXCISION  10/15   nose  . BREAST BIOPSY Right 08/24/2015  . BREAST BIOPSY Right 07/13/2015  . BREAST BIOPSY Left 07/13/2015  . BREAST EXCISIONAL BIOPSY Left 1972  . BREAST LUMPECTOMY Left   . BREAST LUMPECTOMY WITH RADIOACTIVE SEED AND SENTINEL LYMPH NODE BIOPSY Left 09/16/2015   Procedure:  BREAST LUMPECTOMY WITH RADIOACTIVE SEED AND SENTINEL LYMPH NODE BIOPSY;  Surgeon: Stark Klein, MD;  Location: [place];  Service: General;  Laterality: Left;  . BREAST REDUCTION SURGERY Bilateral 1998  . CATARACT EXTRACTION W/ INTRAOCULAR LENS IMPLANT Left 2016  . [place]   hallo  . COLONOSCOPY  01/2013   polyps  . EYE SURGERY Left 2016  . HAND SURGERY Left 02-19-2002   dr Fredna Dow  @[facility]    repair collateral ligament/ MPJ left thumb  . HARDWARE REMOVAL N/A 07/24/2019   Procedure: REVISION OF HARDWARE Lumbar two - Sacral one. Extention of Fusion to Lumbar one;  Surgeon: [name], MD;  Location: [facility];  Service: Neurosurgery;  Laterality: N/A;  . HEMORRHOID SURGERY  2008  . HIGH RESOLUTION ANOSCOPY N/A 02/28/2018   Procedure: HIGH  RESOLUTION ANOSCOPY WITH BIOPSY;  Surgeon: Leighton Ruff, MD;  Location[facility];  Service: General;  Laterality: N/A;  . KNEE ARTHROSCOPY Right 2002  . LUMBAR SPINE SURGERY  03-20-2015   dr Kathyrn Sheriff   fusion L4-5, L5-S1  . MOUTH SURGERY  07/14/2018   2 infected teeth removed  . MULTIPLE TOOTH EXTRACTIONS     with bone grafting lower bottom right and left  . REDUCTION MAMMAPLASTY Bilateral   . RIGHT COLECTOMY  09-10-2003   dr Margot Chimes @[facility]    multiple colon polyps (per path tubular adenoma's, hyperplastic , benign appendix, benign two lymph nodes  . ROTATOR CUFF REPAIR Left 04/2016  . TONSILLECTOMY  1969  . TUBAL LIGATION Bilateral 1982    Vitals:   12/02/19 1725  BP: (!) 148/96  Pulse: (!) 104     Subjective Assessment - 12/02/19 1812    Subjective Pt. reports dealing with some hemorrhoid pain today (requests avoid supine exercises as this exacerbates pain) otherwise no new complaints/concerns since last visit. No significant soreness noted after exercises last session.    Pertinent History L4-S1 fusion 03/20/15, L2-3 fusion 05/30/19, breast CA history 2017, seizures (due to sepsis),  lumbar hardware removal 07/24/19 with subsequent staph infection/osteomyelitis, CHF, COPD, chronic pain in pain management, history ETOH abuse, neuropathy, intention tremor    Currently in Pain? Yes    Pain Location Rectum    Pain Descriptors / Indicators Sharp;Burning    Pain Type Acute pain    Pain Onset Today    Aggravating Factors  lying down>standing or sitting    Effect of Pain on Daily Activities impacts positional tolerance                             [facility] Adult PT Treatment/Exercise - 12/02/19 0001      Ambulation/Gait   Ambulation Distance (Feet) 165 Feet   with 1 seated rest after 90 feet   Assistive device Straight cane    Gait Pattern Step-through pattern    Ambulation Surface Level    Gait Comments Gait training with SPC with min cues for  step-through gait sequence holding cane with RUE. Pt. able to ambulate distance as noted with CGA.      Lumbar Exercises: Aerobic   Nustep L2 x 5 min UE/LE      Lumbar Exercises: Standing   Heel Raises 15 reps    Heel Raises Limitations on Airex    Functional Squats Limitations Partial squat at counter with chair behind pt. for safety and cueing x 15 reps    Other Standing Lumbar Exercises Airex marches x  15 reps at counter    Other Standing Lumbar Exercises step ups using 4 in. step with RW x 15 ea. bilat. with CGA      Lumbar Exercises: Seated   Other Seated Lumbar Exercises hip adduction isometric ball squeeze 5 sec x 15 reps    Other Seated Lumbar Exercises clamshell green band x 15 reps, seated marches x 15 reps                  PT Education - 12/02/19 2019    Education Details vitals, exercises, gait training    Person(s) Educated Patient    Methods Explanation;Verbal cues;Demonstration    Comprehension Verbalized understanding;Returned demonstration;Verbal cues required            PT Short Term Goals - 10/30/19 1741      PT SHORT TERM GOAL #1   Title Independent with HEP    Baseline needs update from home health PT HEP    Time 3    Period Weeks    Status New    Target Date 11/20/19      PT SHORT TERM GOAL #2   Title Improve TUG time (with RW) at least 3-5 sec for decreased fall risk    Baseline 14.7 sec    Time 3    Period Weeks    Status New    Target Date 11/20/19      PT SHORT TERM GOAL #3   Title Ambulate at least 100 feet CGA with SPC for progression to LTG for gait    Baseline uses RW    Time 3    Period Weeks    Status New    Target Date 11/20/19             PT Long Term Goals - 10/30/19 1744      PT LONG TERM GOAL #1   Title Improve [place] Gait + Balance at least 3-5 points for decreased fall risk    Baseline 19/28    Time 6    Period Weeks    Status New    Target Date 12/11/19      PT LONG TERM GOAL #2   Title Increase  left hip and ankle strength at least grossly 1/2 MMT grade to improve ability to ascend/descend stairs to 2nd floor bedroom    Baseline see objective    Time 6    Period Weeks    Status New    Target Date 12/11/19      PT LONG TERM GOAL #3   Title Perform household level ambulation 150 feet or less mod I with SPC vs. LRAD    Baseline uses RW    Time 6    Period Weeks    Status New    Target Date 12/11/19      PT LONG TERM GOAL #4   Title Improve 5 times sit<>stand time to 14 sce or less for improved ability for transfers from low seats    Baseline 17.88 sec    Time 6    Period Weeks    Status New    Target Date 12/11/19      PT LONG TERM GOAL #5   Title Tolerate standing for periods at least 15-20 min for cooking and light chores with LBP 4/10 or less    Baseline tolerance limited to 5 min, pain 6/10 this PM    Time 6    Period Weeks    Status New  Target Date 12/11/19                 Plan - 12/02/19 2020    Clinical Impression Statement Continued gait training with SPC-pt. able to ambulate distance as noted with min cues. Some tendency to lose sequence with cane coordination with steps but gait ability progressing-walking distance limited by fatigue and SOB more than pain or weakness. For exercises today focused standing and seated exercises (held supine exercises due to hemorhoid pain as noted in subjective). O2 sats initially measured at 83-84% on room air (right hand) prior to initiating treatment. Pt. rested with cues for breathing and saturation increased to 94% then able to proceed with exercises. O2 saturation monitored throughout session-of note different readings were obtained depending on side of assessment with readings from left index or middle finger consistently 95-97% while tendency for measures from right side to read 84-85% when fatigued but increased to 93-94% with rest and deep breathing. Advised to monitor O2 sats at home for further measure of status  with this.    Personal Factors and Comorbidities Comorbidity 3+;Time since onset of injury/illness/exacerbation;Age    Comorbidities see PMH    Examination-Activity Limitations Sit;Dressing;Bathing;Transfers;Sleep;Hygiene/Grooming;Bed Mobility;Bend;Lift;Squat;Stairs;Locomotion Level;Carry;Stand;Continence;Toileting    Examination-Participation Restrictions Community Activity;Shop;Cleaning;Laundry;Meal Prep    Stability/Clinical Decision Making Evolving/Moderate complexity    Clinical Decision Making Moderate    Rehab Potential Good    PT Frequency 2x / week    PT Duration 6 weeks    PT Treatment/Interventions ADLs/Self Care Home Management;Cryotherapy;Moist Heat;Therapeutic activities;Gait training;Therapeutic exercise;Patient/family education;Manual techniques;Stair training;Functional mobility training;Balance training;Neuromuscular re-education;Taping    PT Next Visit Plan Recheck FOTO status, monitor vitals/O2 sats, continue gait with SPC,  standing exercise/functional activity progression, continue core + LE strengthening, check status for hemorrhoid pain before retrying supine exercises    Consulted and Agree with Plan of Care Patient           Patient will benefit from skilled therapeutic intervention in order to improve the following deficits and impairments:  Abnormal gait, Decreased balance, Decreased endurance, Difficulty walking, Impaired sensation, Pain, Impaired flexibility, Decreased strength, Decreased activity tolerance  Visit Diagnosis: Chronic bilateral low back pain, unspecified whether sciatica present  Muscle weakness (generalized)  Other abnormalities of gait and mobility     Problem List Patient Active Problem List   Diagnosis Date Noted  . Insomnia 10/29/2019  . Recurrent falls 09/06/2019  . Confusion, postoperative   . Benign essential HTN   . Post-operative pain   . Chronic constipation   . 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 Post op infection  07/11/2019  . Muscle spasm   . Hypokalemia   . Status post lumbar spinal fusion 07/10/2019  . Postoperative pain   . Acute blood loss anemia   . Labile blood pressure   . Tobacco abuse   . Hypoalbuminemia due to protein-calorie malnutrition ([place])   . Anxiety 07/05/2019  . Depression 07/05/2019  . Chronic diastolic CHF (congestive heart failure) ([place]) 07/05/2019  . Obesity (BMI 30-39.9) 07/05/2019  . ETOH abuse 07/05/2019  . Lumbar radiculopathy 06/28/2019  . Spinal stenosis of lumbar region 06/28/2019  . Acute on chronic anemia   . Tremors of nervous system   . AKI (acute kidney injury) ([place])   . Chronic pain syndrome   . Acute renal failure superimposed on stage 3 chronic kidney disease ([place]) 06/25/2019  . Acute osteomyelitis of lumbar spine ([place]) 06/25/2019  . Weakness of left lower extremity   . Abdominal pain 06/24/2019  .  Nausea 06/24/2019  . Abnormal urine odor 06/24/2019  . History of sepsis 06/24/2019  . Recent major surgery 06/24/2019  . History of diverticulosis 06/24/2019  . Elevated PTHrP level 05/09/2019  . Paresthesia 02/12/2019  . Low back pain 02/12/2019  . Neuropathy 12/20/2018  . Lumbar spinal stenosis 06/29/2018  . Preoperative clearance 06/04/2018  . Abnormal CT of the chest 06/04/2018  . Smoker 05/17/2018  . Hypothyroidism 11/30/2017  . Thoracic aortic atherosclerosis ([place]) 09/14/2016  . Benign essential tremor 09/09/2016  . RLS (restless legs syndrome) 09/09/2016  . H/O adenomatous polyp of colon 07/06/2016  . PVC's (premature ventricular contractions) 03/29/2016  . Preoperative cardiovascular examination 03/29/2016  . Grade I diastolic dysfunction 33/00/7622  . Palpitations 01/11/2016  . Shortness of breath 01/11/2016  . Former cigarette smoker 01/11/2016  . Leg edema 01/11/2016  . Chronic cough 10/28/2015  . 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 Malignant neoplasm of upper-inner quadrant of left breast in female, estrogen receptor positive  ([place]) 07/14/2015  . Prediabetes 07/08/2015  . CKD (chronic kidney disease), stage III 07/08/2015  . Dependent edema 06/08/2015  . Sleep disturbance 06/08/2015  . Mixed hyperlipidemia 06/08/2015  . Generalized anxiety disorder 06/08/2015  . Essential hypertension 06/08/2015  . COPD (chronic obstructive pulmonary disease) ([place]) 06/08/2015  . Gout 06/08/2015  . Macrocytic anemia   . 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 Sepsis ([place]) 05/15/2015  . Lumbar spondylosis 03/20/2015    Beaulah Dinning, PT, DPT 12/02/19 8:31 PM  [facility] [address] [place], [place], [address] Phone: [phone]   Fax:  [phone]  Name: JAQUELIN MEANEY MRN: [id number] Date of Birth: 03-04-1946

## 2019-12-05 ENCOUNTER — Ambulatory Visit: Payer: PPO | Admitting: Physical Therapy

## 2019-12-10 ENCOUNTER — Encounter: Payer: PPO | Admitting: Physical Therapy

## 2019-12-12 ENCOUNTER — Encounter: Payer: Self-pay | Admitting: Physical Medicine and Rehabilitation

## 2019-12-12 ENCOUNTER — Encounter: Payer: PPO | Attending: Physical Medicine and Rehabilitation | Admitting: Physical Medicine and Rehabilitation

## 2019-12-12 VITALS — BP 161/98 | HR 104 | Temp 98.6°F | Ht 67.0 in | Wt 217.0 lb

## 2019-12-12 DIAGNOSIS — I10 Essential (primary) hypertension: Secondary | ICD-10-CM | POA: Insufficient documentation

## 2019-12-12 DIAGNOSIS — K5900 Constipation, unspecified: Secondary | ICD-10-CM | POA: Insufficient documentation

## 2019-12-12 DIAGNOSIS — Z981 Arthrodesis status: Secondary | ICD-10-CM | POA: Diagnosis not present

## 2019-12-12 DIAGNOSIS — I952 Hypotension due to drugs: Secondary | ICD-10-CM | POA: Diagnosis not present

## 2019-12-12 DIAGNOSIS — Z79891 Long term (current) use of opiate analgesic: Secondary | ICD-10-CM | POA: Insufficient documentation

## 2019-12-12 DIAGNOSIS — Z5181 Encounter for therapeutic drug level monitoring: Secondary | ICD-10-CM | POA: Diagnosis not present

## 2019-12-12 DIAGNOSIS — G894 Chronic pain syndrome: Secondary | ICD-10-CM | POA: Insufficient documentation

## 2019-12-12 DIAGNOSIS — M4620 Osteomyelitis of vertebra, site unspecified: Secondary | ICD-10-CM | POA: Insufficient documentation

## 2019-12-12 DIAGNOSIS — G8918 Other acute postprocedural pain: Secondary | ICD-10-CM | POA: Diagnosis not present

## 2019-12-12 DIAGNOSIS — M5416 Radiculopathy, lumbar region: Secondary | ICD-10-CM | POA: Diagnosis not present

## 2019-12-12 MED ORDER — AMITRIPTYLINE HCL 50 MG PO TABS: 50.0000 mg | ORAL_TABLET | Freq: Every day | ORAL | 3 refills | Status: DC

## 2019-12-12 NOTE — Patient Instructions (Signed)
[email or web address]

## 2019-12-12 NOTE — Addendum Note (Signed)
Addended by: Franchot Gallo on: 12/12/2019 03:34 PM   Modules accepted: Orders

## 2019-12-12 NOTE — Progress Notes (Addendum)
Subjective:    Patient ID: Brandi Stephens, female    DOB: 1946-01-22, 74 y.o.   MRN: [id number]  Medication Refill Associated symptoms include numbness and weakness.    Brandi Stephens presents for hospital follow-up after CIR admission for staph bacteremia after infection of Brandi spinal hardware.  Pain is staying around 5. The pain is mostly muscle spasms.   The Amitriptyline helps Brandi to sleep a little better. She has improved sleep from 1 to 4 hours. 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 The Cymbalta is helping with the neuropathic pain in Brandi hands.   Last visit I transitioned Brandi to outaptient therapy and she has an appointment today. She has noted improvements in Brandi strength. She is ambulating steadily with RW today. She has bene trying using a cane with therapy.   Pain is currently 5/10 and worst in Brandi lower back where Brandi spinal harware is present as well as in paraspinals. No longer acquiring She has been taking approximately 1 percocet per day and would like to gradually stop using these. She has also been using four 650mg  of Tylenol per day and two 200mg  ibuprofen daily.   She no longer has severe diarrhea or constipation and is much happier about this. It does come and go. She takes 1000mg  magensium at night. She takes colace BID. She likes to eat healthy foods.   Brandi Stephens accompanies Brandi this visit and helps to provide history. She and Brandi Stephens have been out of work for 5 years due to Brandi health conditions and need for Brandi Stephens's assistance. Brandi grandchild recently graduated kindergarten.   She continues to have mild bilateral lower extremity edema. Discussed use of compression garments, elevation, and ice.    Pain Inventory Average Pain 5 Pain Right Now 6 My pain is constant, sharp, burning, stabbing and aching  In the last 24 hours, has pain interfered with the following? General activity 4 Relation with others 3 Enjoyment of life 5 What TIME  of day is your pain at its worst? night Sleep (in general) Poor  Pain is worse with: walking, bending, sitting and standing Pain improves with: rest, heat/ice, therapy/exercise and medication Relief from Meds: 5        Family History  Problem Relation Age of Onset  . Atrial fibrillation Mother   . Ulcers Father   . Breast cancer Maternal Aunt   . Lung cancer Maternal Uncle   . Stomach cancer Maternal Grandmother   . Lung cancer Maternal Aunt   . Brain cancer Maternal Aunt   . Prostate cancer Maternal Grandfather   . Stroke Paternal Grandmother   . Tuberculosis Paternal Grandfather   . Colon cancer Neg Hx   . Esophageal cancer Neg Hx   . Rectal cancer Neg Hx    Social History   Socioeconomic History  . Marital status: Married    Spouse name: robert  . Number of children: 3  . Years of education: college  . Highest education level: Not on file  Occupational History  . Occupation: retired  Tobacco Use  . Smoking status: Former Smoker    Packs/day: 0.50    Years: 35.00    Pack years: 17.50    Types: Cigarettes    Quit date: 05/22/2018    Years since quitting: 1.5  . Smokeless tobacco: Never Used  Vaping Use  . Vaping Use: Never used  Substance and Sexual Activity  . Alcohol use: Yes    Comment:  1 glass of wine a night  . Drug use: Never    Comment: CBD tincture  . Sexual activity: Yes    Birth control/protection: Post-menopausal  Other Topics Concern  . Not on file  Social History Narrative   Lives with husband in a 2 story home.  Has 2 living children.  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  Education: college.    Right-handed.   Five cups coffee per week.   Social Determinants of Health   Financial Resource Strain:   . Difficulty of Paying Living Expenses: Not on file  Food Insecurity:   . Worried About Charity fundraiser in the Last Year: Not on file  . Ran Out of Food in the Last Year: Not on file  Transportation Needs:   . Lack of  Transportation (Medical): Not on file  . Lack of Transportation (Non-Medical): Not on file  Physical Activity:   . Days of Exercise per Week: Not on file  . Minutes of Exercise per Session: Not on file  Stress:   . Feeling of Stress : Not on file  Social Connections:   . Frequency of Communication with Friends and Family: Not on file  . Frequency of Social Gatherings with Friends and Family: Not on file  . Attends Religious Services: Not on file  . Active Member of Clubs or Organizations: Not on file  . Attends Archivist Meetings: Not on file  . Marital Status: Not on file   Past Surgical History:  Procedure Laterality Date  . ABDOMINAL HYSTERECTOMY  1985  . BASAL CELL CARCINOMA EXCISION  10/15   nose  . BREAST BIOPSY Right 08/24/2015  . BREAST BIOPSY Right 07/13/2015  . BREAST BIOPSY Left 07/13/2015  . BREAST EXCISIONAL BIOPSY Left 1972  . BREAST LUMPECTOMY Left   . BREAST LUMPECTOMY WITH RADIOACTIVE SEED AND SENTINEL LYMPH NODE BIOPSY Left 09/16/2015   Procedure: BREAST LUMPECTOMY WITH RADIOACTIVE SEED AND SENTINEL LYMPH NODE BIOPSY;  Surgeon: Stark Klein, MD;  Location: [place];  Service: General;  Laterality: Left;  . BREAST REDUCTION SURGERY Bilateral 1998  . CATARACT EXTRACTION W/ INTRAOCULAR LENS IMPLANT Left 2016  . [place]   hallo  . COLONOSCOPY  01/2013   polyps  . EYE SURGERY Left 2016  . HAND SURGERY Left 02-19-2002   dr Fredna Dow  @[facility]    repair collateral ligament/ MPJ left thumb  . HARDWARE REMOVAL N/A 07/24/2019   Procedure: REVISION OF HARDWARE Lumbar two - Sacral one. Extention of Fusion to Lumbar one;  Surgeon: [name], MD;  Location: [place];  Service: Neurosurgery;  Laterality: N/A;  . HEMORRHOID SURGERY  2008  . HIGH RESOLUTION ANOSCOPY N/A 02/28/2018   Procedure: HIGH RESOLUTION ANOSCOPY WITH BIOPSY;  Surgeon: Leighton Ruff, MD;  [facility];  Service: General;  Laterality:  N/A;  . KNEE ARTHROSCOPY Right 2002  . LUMBAR SPINE SURGERY  03-20-2015   dr Kathyrn Sheriff   fusion L4-5, L5-S1  . MOUTH SURGERY  07/14/2018   2 infected teeth removed  . MULTIPLE TOOTH EXTRACTIONS     with bone grafting lower bottom right and left  . REDUCTION MAMMAPLASTY Bilateral   . RIGHT COLECTOMY  09-10-2003   dr Margot Chimes @[facility]    multiple colon polyps (per path tubular adenoma's, hyperplastic , benign appendix, benign two lymph nodes  . ROTATOR CUFF REPAIR Left 04/2016  . TONSILLECTOMY  1969  . TUBAL LIGATION Bilateral 1982  Past Medical History:  Diagnosis Date  . AIN (anal intraepithelial neoplasia) anal canal   . Anemia    with pregnancy  . Anxiety   . Arthritis    knees, lower back, knees  . Chronic constipation   . Chronic diastolic CHF (congestive heart failure) ([place]) 07/05/2019  . CKD (chronic kidney disease)   . Coarse tremors    Essential  . COPD (chronic obstructive pulmonary disease) ([place])    2017 chest CT, history of  . Degenerative lumbar spinal stenosis   . Depression   . Depression 07/05/2019  . Drug dependency ([place])   . Elevated PTHrP level 05/09/2019  . ETOH abuse 07/05/2019  . GERD (gastroesophageal reflux disease)    pt denies  . Gout   . Grade I diastolic dysfunction 29/47/6546   Noted ECHO  . Hemorrhoids   . History of adenomatous polyp of colon   . History of basal cell carcinoma (BCC) excision 2015   nose  . History of sepsis 05/14/2014   post lumbar surgery  . History of shingles    x2  . Hyperlipidemia   . Hypertension   . Hypothyroidism   . Irregular heart beat    history of while undergoing radiation  . Malignant neoplasm of upper-inner quadrant of left breast in female, estrogen receptor positive [facility]) oncologist-  dr Jana Hakim--  per lov in epic no recurrence   dx 07-13-2015  Left breast invasive ductal carcinoma, Stage IA, Grade 1 (TXN0),  09-16-2015  s/p  left breast lumpectomy with sln bx's,  completed radiation 11-18-2015,  started  antiestrogen therapy 12-14-2015  . Neuropathy    Left foot and back of left leg  . Obesity (BMI 30-39.9) 07/05/2019  . Personal history of radiation therapy    completed 11-18-2015  left breast  . Pneumonia   . PONV (postoperative nausea and vomiting)   . Prediabetes    diet controlled, no med  . Restless leg syndrome   . Seasonal allergies   . Seizure ([place]) 05/2015   due to sepsis, only one time episode  . Tremor   . Wears partial dentures    lower    BP (!) 161/98   Pulse (!) 104   Temp 98.6 F (37 C)   Ht 5\' 7"  (1.702 m)   Wt 217 lb (98.4 kg)   SpO2 94%   BMI 33.99 kg/m   Opioid Risk Score:   Fall Risk Score:  `1  Depression screen PHQ 2/9  Depression screen [facility] 2/9 10/29/2019 10/02/2019 08/22/2019 03/18/2019 03/07/2019 12/20/2018 11/30/2017  Decreased Interest 0 0 0 0 0 0 0  Down, Depressed, Hopeless 0 0 0 0 0 0 0  PHQ - 2 Score 0 0 0 0 0 0 0  Altered sleeping - - 3 - - - -  Tired, decreased energy - - 2 - - - -  Change in appetite - - 2 - - - -  Feeling bad or failure about yourself  - - 1 - - - -  Trouble concentrating - - 1 - - - -  Moving slowly or fidgety/restless - - 0 - - - -  Suicidal thoughts - - 0 - - - -  PHQ-9 Score - - 9 - - - -  Difficult doing work/chores - - Not difficult at all - - - -  Some recent data might be hidden    Review of Systems  Musculoskeletal: Positive for gait problem.  Neurological: Positive for dizziness, tremors,  weakness and numbness.       Tingling   Psychiatric/Behavioral: Positive for confusion.  All other systems reviewed and are negative.      Objective:   Physical Exam Gen: no distress, normal appearing HEENT: oral mucosa pink and moist, NCAT Cardio: Reg rate Chest: normal effort, normal rate of breathing Abd: soft, non-distended Skin: surgical scar on spine- healing beautifully. TTP in this region. Bilateral lower extremity edema.  Neuro: Alert and oriented x3 Musculoskeletal: 4-5 strength throughout,  Sensation intact.  Functional mobility: ambulating with RW.  Psych: pleasant, normal affect    Assessment & Plan:  Mrs. Heady is a 74 year old female who presents for hospital follow-up after CIR admission post back surgery, complicated by staph infection, now on chronic antibiotics.  Diarrhea: This was a huge issue previously and has since improved. I had obtained an XR that showed constipation and recommended an enema. Continue magnesium 1000mg  at night and colace BID.   Impaired mobility and ADLs following spinal surgery: -Continue outpatient PT and OT. Progressing well. Increase walking to 10 minutes per day -Recommended exercise bike at home to build back endurance. Continue walking outside as well as strength and gait are improving and she is no longer limited by diarrhea.  Post-operative lower back pain: -Stopped oxycodone -Continue to supplement with icing, Tyelnol, and Ibuprofen.   Peripheral neuropathy: -Discussed [employer] PNS system as an option of pain treatment via neuromodulation. Provided following link for patient to learn more about the system: https://www.sprthe[employer].com/.  -Increase Amitriptyline to 50mg  HS PRN.  The patient has severe pain that has been shown to be refractory to conventional medical management with physical therapy, a number of pharmacologic options, minimally invasive procedures, and pain coping skills. Function is severely limited due to the pain and ability to perform self-care and activities of daily living is marginal. The patient has responded partially to opiate medications. At this point, having failed most conservative therapies, I consider peripheral nerve stimulation to be medically necessary to reduce his pain, increase his functionality, and in order to avoid opiate dose escalation and the concomitant risks of tolerance, physical dependence, misuse, abuse, addiction, endocrine dysfunction, respiratory depression, and death.   Anixety:  Continue Diazepam. Has been taking twice per day and this has been helping Brandi. Also helps with Brandi muscle spasms.   Tachycardia: provided higher dose of metoprolol which she was on before last visit. HR is still slightly elevated today, but BP is low. Maintain current dose. She says BP was elevated at Brandi doctor's appointment yesterday. It fluctuates.   Lower extremity edema: advised elevation, ice, and compression garments which she is already doing. Prescribed prn Lasix.   All questions answered. RTC in 4 weeks.

## 2019-12-13 ENCOUNTER — Ambulatory Visit: Payer: PPO | Admitting: Physical Therapy

## 2019-12-17 ENCOUNTER — Ambulatory Visit: Payer: PPO | Admitting: Physical Therapy

## 2019-12-17 ENCOUNTER — Other Ambulatory Visit: Payer: Self-pay

## 2019-12-17 ENCOUNTER — Encounter: Payer: Self-pay | Admitting: Physical Therapy

## 2019-12-17 DIAGNOSIS — M5416 Radiculopathy, lumbar region: Secondary | ICD-10-CM

## 2019-12-17 DIAGNOSIS — R2689 Other abnormalities of gait and mobility: Secondary | ICD-10-CM

## 2019-12-17 DIAGNOSIS — M545 Low back pain, unspecified: Secondary | ICD-10-CM

## 2019-12-17 DIAGNOSIS — M6281 Muscle weakness (generalized): Secondary | ICD-10-CM

## 2019-12-17 NOTE — Patient Instructions (Signed)
Access Code: [email or web address] URL: [email or web address] Date: 12/17/2019 Prepared by: Hessie Diener  Exercises Mini Squat with Counter Support - 2 x daily - 7 x weekly - 2 sets - 10 reps Standing March with Counter Support - 2 x daily - 7 x weekly - 3 sets - 10 reps Standing Heel Raise with Support - 2 x daily - 7 x weekly - 3 sets - 10 reps Standing Hip Abduction with Counter Support - 2 x daily - 7 x weekly - 3 sets - 10 reps

## 2019-12-17 NOTE — Therapy (Signed)
[place], [place], [address] Phone: [phone]   Fax:  [phone]  Physical Therapy Treatment  Patient Details  Name: PURA PICINICH MRN: [id number] Date of Birth: 1945-09-09 Referring Provider (PT): Leeroy Cha, MD   Encounter Date: 12/17/2019   PT End of Session - 12/17/19 1324    Visit Number 6    Number of Visits 12    Date for PT Re-Evaluation 12/11/19    Authorization Type [employer], progress note by visit 10    PT Start Time 1317    PT Stop Time 1400    PT Time Calculation (min) 43 min           Past Medical History:  Diagnosis Date   AIN (anal intraepithelial neoplasia) anal canal    Anemia    with pregnancy   Anxiety    Arthritis    knees, lower back, knees   Chronic constipation    Chronic diastolic CHF (congestive heart failure) ([place]) 07/05/2019   CKD (chronic kidney disease)    Coarse tremors    Essential   COPD (chronic obstructive pulmonary disease) ([place])    2017 chest CT, history of   Degenerative lumbar spinal stenosis    Depression    Depression 07/05/2019   Drug dependency ([place])    Elevated PTHrP level 05/09/2019   ETOH abuse 07/05/2019   GERD (gastroesophageal reflux disease)    pt denies   Gout    Grade I diastolic dysfunction 98/92/1194   Noted ECHO   Hemorrhoids    History of adenomatous polyp of colon    History of basal cell carcinoma (BCC) excision 2015   nose   History of sepsis 05/14/2014   post lumbar surgery   History of shingles    x2   Hyperlipidemia    Hypertension    Hypothyroidism    Irregular heart beat    history of while undergoing radiation   Malignant neoplasm of upper-inner quadrant of left breast in female, estrogen receptor positive [facility]) oncologist-  dr Jana Hakim--  per lov in epic no recurrence   dx 07-13-2015  Left breast invasive ductal carcinoma, Stage IA, Grade 1 (TXN0),  09-16-2015  s/p  left  breast lumpectomy with sln bx's,  completed radiation 11-18-2015,  started antiestrogen therapy 12-14-2015   Neuropathy    Left foot and back of left leg   Obesity (BMI 30-39.9) 07/05/2019   Personal history of radiation therapy    completed 11-18-2015  left breast   Pneumonia    PONV (postoperative nausea and vomiting)    Prediabetes    diet controlled, no med   Restless leg syndrome    Seasonal allergies    Seizure ([place]) 05/2015   due to sepsis, only one time episode   Tremor    Wears partial dentures    lower     Past Surgical History:  Procedure Laterality Date   ABDOMINAL HYSTERECTOMY  1985   BASAL CELL CARCINOMA EXCISION  10/15   nose   BREAST BIOPSY Right 08/24/2015   BREAST BIOPSY Right 07/13/2015   BREAST BIOPSY Left 07/13/2015   BREAST EXCISIONAL BIOPSY Left 1972   BREAST LUMPECTOMY Left    BREAST LUMPECTOMY WITH RADIOACTIVE SEED AND SENTINEL LYMPH NODE BIOPSY Left 09/16/2015   Procedure: BREAST LUMPECTOMY WITH RADIOACTIVE SEED AND SENTINEL LYMPH NODE BIOPSY;  Surgeon: Stark Klein, MD;  Location: [place];  Service: General;  Laterality: Left;   BREAST REDUCTION  SURGERY Bilateral 1998   CATARACT EXTRACTION W/ INTRAOCULAR LENS IMPLANT Left 2016   CERVICAL SPINE SURGERY  1995   hallo   COLONOSCOPY  01/2013   polyps   EYE SURGERY Left 2016   HAND SURGERY Left 02-19-2002   dr Fredna Dow  '@[facility]'    repair collateral ligament/ MPJ left thumb   HARDWARE REMOVAL N/A 07/24/2019   Procedure: REVISION OF HARDWARE Lumbar two - Sacral one. Extention of Fusion to Lumbar one;  Surgeon: [name], MD;  Location: [place];  Service: Neurosurgery;  Laterality: N/A;   HEMORRHOID SURGERY  2008   HIGH RESOLUTION ANOSCOPY N/A 02/28/2018   Procedure: HIGH RESOLUTION ANOSCOPY WITH BIOPSY;  Surgeon: Leighton Ruff, MD;  Location: [place];  Service: General;  Laterality: N/A;   KNEE ARTHROSCOPY Right 2002   LUMBAR SPINE  SURGERY  03-20-2015   dr Kathyrn Sheriff   fusion L4-5, L5-S1   MOUTH SURGERY  07/14/2018   2 infected teeth removed   MULTIPLE TOOTH EXTRACTIONS     with bone grafting lower bottom right and left   REDUCTION MAMMAPLASTY Bilateral    RIGHT COLECTOMY  09-10-2003   dr Margot Chimes '@[facility]'    multiple colon polyps (per path tubular adenoma's, hyperplastic , benign appendix, benign two lymph nodes   ROTATOR CUFF REPAIR Left 04/2016   TONSILLECTOMY  1969   TUBAL LIGATION Bilateral 1982    There were no vitals filed for this visit.   Subjective Assessment - 12/17/19 1322    Subjective Pt reports SOB has been better. She is using her inhaler daily.    Pertinent History L4-S1 fusion 03/20/15, L2-3 fusion 05/30/19, breast CA history 2017, seizures (due to sepsis),  lumbar hardware removal 07/24/19 with subsequent staph infection/osteomyelitis, CHF, COPD, chronic pain in pain management, history ETOH abuse, neuropathy, intention tremor    Currently in Pain? Yes    Pain Score 5     Pain Location Back    Pain Orientation Lower    Pain Descriptors / Indicators Aching;Burning    Aggravating Factors  stairs bother knees, bend over , twist    Pain Relieving Factors ice, meds              [facility] PT Assessment - 12/17/19 0001      Observation/Other Assessments   Focus on Therapeutic Outcomes (FOTO)  NT at eval                         [facility] Adult PT Treatment/Exercise - 12/17/19 0001      Ambulation/Gait   Ambulation Distance (Feet) 203 Feet   one standing break after 177f    Assistive device Straight cane   CGA   Gait Pattern Step-through pattern      Lumbar Exercises: Aerobic   Nustep L4 UE/LE x 7 minutes       Lumbar Exercises: Standing   Heel Raises 20 reps    Heel Raises Limitations on Airex    Functional Squats Limitations Partial squat with chair behind pt. for safety and cueing x 15 reps   on airex at freemotion   Other Standing Lumbar Exercises Airex marches x 15 reps at  free motion       Lumbar Exercises: Seated   Sit to Stand 10 reps   with UE from chair                    PT Short Term Goals - 12/17/19 1354  PT SHORT TERM GOAL #1   Title Independent with HEP    Time 3    Period Weeks    Status Achieved    Target Date 11/20/19      PT SHORT TERM GOAL #2   Title Improve TUG time (with RW) at least 3-5 sec for decreased fall risk    Baseline 14.7 sec    Time 3    Status Unable to assess      PT SHORT TERM GOAL #3   Title Ambulate at least 100 feet CGA with SPC for progression to LTG for gait    Baseline 170 SPC, CGA without rest    Time 3    Period Weeks    Status Achieved             PT Long Term Goals - 10/30/19 1744      PT LONG TERM GOAL #1   Title Improve [place] Gait + Balance at least 3-5 points for decreased fall risk    Baseline 19/28    Time 6    Period Weeks    Status New    Target Date 12/11/19      PT LONG TERM GOAL #2   Title Increase left hip and ankle strength at least grossly 1/2 MMT grade to improve ability to ascend/descend stairs to 2nd floor bedroom    Baseline see objective    Time 6    Period Weeks    Status New    Target Date 12/11/19      PT LONG TERM GOAL #3   Title Perform household level ambulation 150 feet or less mod I with SPC vs. LRAD    Baseline uses RW    Time 6    Period Weeks    Status New    Target Date 12/11/19      PT LONG TERM GOAL #4   Title Improve 5 times sit<>stand time to 14 sce or less for improved ability for transfers from low seats    Baseline 17.88 sec    Time 6    Period Weeks    Status New    Target Date 12/11/19      PT LONG TERM GOAL #5   Title Tolerate standing for periods at least 15-20 min for cooking and light chores with LBP 4/10 or less    Baseline tolerance limited to 5 min, pain 6/10 this PM    Time 6    Period Weeks    Status New    Target Date 12/11/19                 Plan - 12/17/19 1341    Clinical Impression Statement  Pt reports appts canceld due to hemorroid procedure. She is feeling much better. Increased gait distance without seated rest break to 232f. SPO2 dropped to 80% which returned to 95% with rest and intentional breathing.Continued with closed chain exercises with rest reaks frequently due to SOB and SPO2%. Issued closed chain HEP and educated on need for rest breaks and monitoring her oxygen% at home. STG#1, #3 met.    PT Next Visit Plan NO FOTO take at EVAL,  monitor vitals/O2 sats, continue gait with SPC,  standing exercise/functional activity progression, continue core + LE strengthening, check status for hemorrhoid pain before retrying supine exercises    PT Home Exercise Plan Access Code: [email or web address]: [email or web address]: 08/31/2021Prepared by: JCaesar BookmanSquat with Counter Support - 2 x daily - 7 x weekly - 2  sets - 10 repsStanding March with Counter Support - 2 x daily - 7 x weekly - 3 sets - 10 repsStanding Heel Raise with Support - 2 x daily - 7 x weekly - 3 sets - 10 repsStanding Hip Abduction with Counter Support - 2 x daily - 7 x weekly - 3 sets - 10 reps           Patient will benefit from skilled therapeutic intervention in order to improve the following deficits and impairments:  Abnormal gait, Decreased balance, Decreased endurance, Difficulty walking, Impaired sensation, Pain, Impaired flexibility, Decreased strength, Decreased activity tolerance  Visit Diagnosis: Chronic bilateral low back pain, unspecified whether sciatica present  Muscle weakness (generalized)  Other abnormalities of gait and mobility  Lumbar radiculopathy     Problem List Patient Active Problem List   Diagnosis Date Noted   Insomnia 10/29/2019   Recurrent falls 09/06/2019   Confusion, postoperative    Benign essential HTN    Post-operative pain    Chronic constipation    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   Post op infection 07/11/2019   Muscle spasm      Hypokalemia    Status post lumbar spinal fusion 07/10/2019   Postoperative pain    Acute blood loss anemia    Labile blood pressure    Tobacco abuse    Hypoalbuminemia due to protein-calorie malnutrition ([facility])    Anxiety 07/05/2019   Depression 07/05/2019   Chronic diastolic CHF (congestive heart failure) ([facility]) 07/05/2019   Obesity (BMI 30-39.9) 07/05/2019   ETOH abuse 07/05/2019   Lumbar radiculopathy 06/28/2019   Spinal stenosis of lumbar region 06/28/2019   Acute on chronic anemia    Tremors of nervous system    AKI (acute kidney injury) ([place])    Chronic pain syndrome    Acute renal failure superimposed on stage 3 chronic kidney disease ([place]) 06/25/2019   Acute osteomyelitis of lumbar spine ([place]) 06/25/2019   Weakness of left lower extremity    Abdominal pain 06/24/2019   Nausea 06/24/2019   Abnormal urine odor 06/24/2019   History of sepsis 06/24/2019   Recent major surgery 06/24/2019   History of diverticulosis 06/24/2019   Elevated PTHrP level 05/09/2019   Paresthesia 02/12/2019   Low back pain 02/12/2019   Neuropathy 12/20/2018   Lumbar spinal stenosis 06/29/2018   Preoperative clearance 06/04/2018   Abnormal CT of the chest 06/04/2018   Smoker 05/17/2018   Hypothyroidism 11/30/2017   Thoracic aortic atherosclerosis ([place]) 09/14/2016   Benign essential tremor 09/09/2016   RLS (restless legs syndrome) 09/09/2016   H/O adenomatous polyp of colon 07/06/2016   PVC's (premature ventricular contractions) 03/29/2016   Preoperative cardiovascular examination 03/29/2016   Grade I diastolic dysfunction 62/37/6283   Palpitations 01/11/2016   Shortness of breath 01/11/2016   Former cigarette smoker 01/11/2016   Leg edema 01/11/2016   Chronic cough 10/28/2015   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   Malignant neoplasm of upper-inner quadrant of left breast in female, estrogen receptor positive ([place]) 07/14/2015    Prediabetes 07/08/2015   CKD (chronic kidney disease), stage III 07/08/2015   Dependent edema 06/08/2015   Sleep disturbance 06/08/2015   Mixed hyperlipidemia 06/08/2015   Generalized anxiety disorder 06/08/2015   Essential hypertension 06/08/2015   COPD (chronic obstructive pulmonary disease) ([place]) 06/08/2015   Gout 06/08/2015   Macrocytic anemia    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

## 2019-12-19 ENCOUNTER — Ambulatory Visit: Payer: PPO | Attending: Physical Medicine and Rehabilitation | Admitting: Physical Therapy

## 2019-12-19 ENCOUNTER — Other Ambulatory Visit: Payer: Self-pay

## 2019-12-19 ENCOUNTER — Encounter: Payer: Self-pay | Admitting: Physical Therapy

## 2019-12-19 VITALS — BP 152/84 | HR 95

## 2019-12-19 DIAGNOSIS — M545 Low back pain: Secondary | ICD-10-CM | POA: Insufficient documentation

## 2019-12-19 DIAGNOSIS — M6281 Muscle weakness (generalized): Secondary | ICD-10-CM

## 2019-12-19 DIAGNOSIS — M5416 Radiculopathy, lumbar region: Secondary | ICD-10-CM | POA: Insufficient documentation

## 2019-12-19 DIAGNOSIS — G8929 Other chronic pain: Secondary | ICD-10-CM | POA: Diagnosis not present

## 2019-12-19 DIAGNOSIS — R2689 Other abnormalities of gait and mobility: Secondary | ICD-10-CM

## 2019-12-19 NOTE — Therapy (Signed)
[place], [place], [address] Phone: [phone]   Fax:  [phone]  Physical Therapy Treatment/Recertification Progress Note Reporting Period 10/30/2019 to 12/19/2019  See note below for Objective Data and Assessment of Progress/Goals.       Patient Details  Name: Brandi Stephens MRN: [id number] Date of Birth: [birth year] Referring Provider (PT): Leeroy Cha, MD   Encounter Date: 12/19/2019   PT End of Session - 12/19/19 1514    Visit Number 7    Number of Visits 18    Date for PT Re-Evaluation 01/30/20    Authorization Type [place] Medicare, next progress note by visit 17    PT Start Time 1416    PT Stop Time 1500    PT Time Calculation (min) 44 min    Equipment Utilized During Treatment Gait belt   BP cuff and O2 monitor   Activity Tolerance Patient limited by fatigue;Patient limited by pain    Behavior During Therapy [facility] for tasks assessed/performed           Past Medical History:  Diagnosis Date  . AIN (anal intraepithelial neoplasia) anal canal   . Anemia    with pregnancy  . Anxiety   . Arthritis    knees, lower back, knees  . Chronic constipation   . Chronic diastolic CHF (congestive heart failure) ([place]) 07/05/2019  . CKD (chronic kidney disease)   . Coarse tremors    Essential  . COPD (chronic obstructive pulmonary disease) ([place])    2017 chest CT, history of  . Degenerative lumbar spinal stenosis   . Depression   . Depression 07/05/2019  . Drug dependency ([place])   . Elevated PTHrP level 05/09/2019  . ETOH abuse 07/05/2019  . GERD (gastroesophageal reflux disease)    pt denies  . Gout   . Grade I diastolic dysfunction 02/63/7858   Noted ECHO  . Hemorrhoids   . History of adenomatous polyp of colon   . History of basal cell carcinoma (BCC) excision 2015   nose  . History of sepsis 05/14/2014   post lumbar surgery  . History of shingles    x2  . Hyperlipidemia   .  Hypertension   . Hypothyroidism   . Irregular heart beat    history of while undergoing radiation  . Malignant neoplasm of upper-inner quadrant of left breast in female, estrogen receptor positive [facility]) oncologist-  dr Jana Hakim--  per lov in epic no recurrence   dx 07-13-2015  Left breast invasive ductal carcinoma, Stage IA, Grade 1 (TXN0),  09-16-2015  s/p  left breast lumpectomy with sln bx's,  completed radiation 11-18-2015,  started antiestrogen therapy 12-14-2015  . Neuropathy    Left foot and back of left leg  . Obesity (BMI 30-39.9) 07/05/2019  . Personal history of radiation therapy    completed 11-18-2015  left breast  . Pneumonia   . PONV (postoperative nausea and vomiting)   . Prediabetes    diet controlled, no med  . Restless leg syndrome   . Seasonal allergies   . Seizure ([place]) 05/2015   due to sepsis, only one time episode  . Tremor   . Wears partial dentures    lower     Past Surgical History:  Procedure Laterality Date  . ABDOMINAL HYSTERECTOMY  1985  . BASAL CELL CARCINOMA EXCISION  10/15   nose  . BREAST BIOPSY Right 08/24/2015  . BREAST BIOPSY Right 07/13/2015  . BREAST BIOPSY Left 07/13/2015  .  BREAST EXCISIONAL BIOPSY Left 1972  . BREAST LUMPECTOMY Left   . BREAST LUMPECTOMY WITH RADIOACTIVE SEED AND SENTINEL LYMPH NODE BIOPSY Left 09/16/2015   Procedure: BREAST LUMPECTOMY WITH RADIOACTIVE SEED AND SENTINEL LYMPH NODE BIOPSY;  Surgeon: Faera Byerly, MD;  Location:  SURGERY CENTER;  Service: General;  Laterality: Left;  . BREAST REDUCTION SURGERY Bilateral 1998  . CATARACT EXTRACTION W/ INTRAOCULAR LENS IMPLANT Left 2016  . CERVICAL SPINE SURGERY  1995   hallo  . COLONOSCOPY  01/2013   polyps  . EYE SURGERY Left 2016  . HAND SURGERY Left 02-19-2002   dr kuzma  @[facility]   repair collateral ligament/ MPJ left thumb  . HARDWARE REMOVAL N/A 07/24/2019   Procedure: REVISION OF HARDWARE Lumbar two - Sacral one. Extention of Fusion to Lumbar one;   Surgeon: Nundkumar, Neelesh, MD;  Location: [facility] OR;  Service: Neurosurgery;  Laterality: N/A;  . HEMORRHOID SURGERY  2008  . HIGH RESOLUTION ANOSCOPY N/A 02/28/2018   Procedure: HIGH RESOLUTION ANOSCOPY WITH BIOPSY;  Surgeon: Thomas, Alicia, MD;  Location: [facility];  Service: General;  Laterality: N/A;  . KNEE ARTHROSCOPY Right 2002  . LUMBAR SPINE SURGERY  03-20-2015   dr nundkumar   fusion L4-5, L5-S1  . MOUTH SURGERY  07/14/2018   2 infected teeth removed  . MULTIPLE TOOTH EXTRACTIONS     with bone grafting lower bottom right and left  . REDUCTION MAMMAPLASTY Bilateral   . RIGHT COLECTOMY  09-10-2003   dr streck @[facility]   multiple colon polyps (per path tubular adenoma's, hyperplastic , benign appendix, benign two lymph nodes  . ROTATOR CUFF REPAIR Left 04/2016  . TONSILLECTOMY  1969  . TUBAL LIGATION Bilateral 1982    Vitals:   12/19/19 1418  BP: (!) 152/84  Pulse: 95  SpO2: 91%     Subjective Assessment - 12/19/19 1418    Subjective Pt. had follow up wth Dr. Raulkar on 12/12/19 with plan to continue PT. She reports LBP 6-7/10 this PM with LBP as contributing factor to limited walking tolerance. She reports may be considering spinal stimulator to help manage pain. She continues with RW use for mobility-see plan/assessment. Some recently missed PT sessions also due to having to have procedure to hemorrhoids (lanced) but her pain with this is improving.    Pertinent History L4-S1 fusion 03/20/15, L2-3 fusion 05/30/19, breast CA history 2017, seizures (due to sepsis),  lumbar hardware removal 07/24/19 with subsequent staph infection/osteomyelitis, CHF, COPD, chronic pain in pain management, history ETOH abuse, neuropathy, intention tremor    Limitations Sitting;House hold activities;Lifting;Standing;Walking    Currently in Pain? Yes    Pain Score --   6-7   Pain Location Back    Pain Orientation Lower    Pain Descriptors / Indicators Sharp;Stabbing    Pain Type Chronic  pain    Pain Onset More than a month ago    Pain Frequency Constant    Aggravating Factors  standing and walking, activity    Pain Relieving Factors ice, medication    Effect of Pain on Daily Activities limits positional tolerance and ability for ambulation              [facility] PT Assessment - 12/19/19 0001      Strength   Right Hip Flexion 4/5    Right Hip External Rotation  5/5    Right Hip Internal Rotation 5/5    Left Hip Flexion 4+/5    Left Hip External Rotation   5/5    Left Hip Internal Rotation 4+/5    Right Knee Flexion 5/5    Right Knee Extension 5/5    Left Knee Flexion 5/5    Left Knee Extension 5/5    Right Ankle Dorsiflexion 5/5    Right Ankle Inversion 5/5    Right Ankle Eversion 5/5    Left Ankle Dorsiflexion 4+/5    Left Ankle Inversion 4+/5    Left Ankle Eversion 4+/5      Transfers   Five time sit to stand comments  11.7 seconds      Ambulation/Gait   Gait Comments Tinetti Gait + Balance 19/28      Timed Up and Go Test   TUG Comments 13 seconds                         [facility] Adult PT Treatment/Exercise - 12/19/19 0001      Ambulation/Gait   Ambulation Distance (Feet) 195 Feet    Assistive device Straight cane    Gait Pattern Step-through pattern   CGA-SBA, cues for sequencing, pace and breathing     Lumbar Exercises: Standing   Row AROM;Strengthening;20 reps    Row Limitations Black Theraband    Shoulder Extension AROM;Strengthening;Both;20 reps    Theraband Level (Shoulder Extension) Level 3 (Green)    Other Standing Lumbar Exercises Step ups-front step up to 6 in. step x 15 ea. bilat. (1 set of 10 and 2nd set of 5) with bilat. UE support on counter      Lumbar Exercises: Supine   Pelvic Tilt 15 reps    Pelvic Tilt Limitations verbal + tactile cues for form    Bent Knee Raise 10 reps    Bridge with Ball Squeeze 15 reps    Bridge with Ball Squeeze Limitations mini/partial bridge-cues to avoid holding breath/valsalva                    PT Education - 12/19/19 1513    Education Details POC, MD follow up re: vitals/concern O2 saturation    Person(s) Educated Patient    Methods Explanation    Comprehension Verbalized understanding            PT Short Term Goals - 12/19/19 1432      PT SHORT TERM GOAL #1   Title Independent with HEP    Baseline met for initial HEP, will update prn    Time 3    Period Weeks    Status Achieved      PT SHORT TERM GOAL #2   Title Improve TUG time (with RW) at least 3-5 sec for decreased fall risk    Baseline 13 sec-improved from baseline but goal still ongoing    Time 3    Period Weeks    Status On-going    Target Date 01/09/20      PT SHORT TERM GOAL #3   Title Ambulate at least 100 feet CGA with SPC for progression to LTG for gait    Baseline met/able    Time 3    Period Weeks    Status Achieved             PT Long Term Goals - 12/19/19 1434      PT LONG TERM GOAL #1   Title Improve Tinetti Gait + Balance at least 3-5 points for decreased fall risk    Baseline 19/28    Time 6    Period   Weeks    Status On-going    Target Date 01/30/20      PT LONG TERM GOAL #2   Title Increase left hip and ankle strength at least grossly 1/2 MMT grade to improve ability to ascend/descend stairs to 2nd floor bedroom    Baseline see objective-partially met    Time 6    Period Weeks    Status Partially Met    Target Date 01/30/20      PT LONG TERM GOAL #3   Title Perform household level ambulation 150 feet or less mod I with SPC vs. LRAD    Baseline progressing in therapy with gait with SPC but continues with RW use    Time 6    Period Weeks    Status On-going    Target Date 01/30/20      PT LONG TERM GOAL #4   Title Improve 5 times sit<>stand time to 14 sce or less for improved ability for transfers from low seats    Baseline met-11.7 seconds 12/19/19    Time 6    Period Weeks    Status Achieved      PT LONG TERM GOAL #5   Title Tolerate standing for  periods at least 15-20 min for cooking and light chores with LBP 4/10 or less    Baseline still ongoing with higher pain level    Time 6    Period Weeks    Status On-going    Target Date 01/30/20                 Plan - 12/19/19 1515    Clinical Impression Statement Pt. returns for 7th therapy visit today with some recent missed visits due to hemorrhoid procedure. She is making progress with strength gains as evidenced by MMT assessment as well as time for 5 times sit<>stand and Timed Up and Go tests compared with baseline status. For gait/balance Tinetti score still indicative of increased fall risk at 19/28-score still consistent with initial eval but with continued use/need AD as limiting factor for score improvement. She is progressing with gait with decreasing need for RW and has been working in therapy sessions toward SPC with increasing distances and decreased need for therapist assistance. Back pain has been a limiting factor for walking tolerance as well as tendency for O2 saturation to drop <90% when ambulating and requiring rest breaks for O2 to recover within safe limites for activity (today O2 decreased to 80% with walking but recovered >90% with brief rest and cues for pursed lip breathing). Recommend MD follow up re: hypoxia as this has been an ongoing tendency in therapy sessions. Plan continue PT otherwise for continued progress to improve safety and functional status for mobility.    Personal Factors and Comorbidities Comorbidity 3+;Time since onset of injury/illness/exacerbation;Age    Comorbidities see PMH    Examination-Activity Limitations Sit;Dressing;Bathing;Transfers;Sleep;Hygiene/Grooming;Bed Mobility;Bend;Lift;Squat;Stairs;Locomotion Level;Carry;Stand;Continence;Toileting    Examination-Participation Restrictions Community Activity;Shop;Cleaning;Laundry;Meal Prep    Stability/Clinical Decision Making Evolving/Moderate complexity    Clinical Decision Making Moderate      Rehab Potential Good    PT Frequency 2x / week    PT Duration 6 weeks    PT Treatment/Interventions ADLs/Self Care Home Management;Cryotherapy;Moist Heat;Therapeutic activities;Gait training;Therapeutic exercise;Patient/family education;Manual techniques;Stair training;Functional mobility training;Balance training;Neuromuscular re-education;Taping    PT Next Visit Plan monitor vitals/O2 sats, continue gait with SPC,  standing exercise/functional activity progression, continue core + LE strengthening, check status for hemorrhoid pain before retrying supine exercises    PT Home   Exercise Plan Access Code: [email or web address]: [email or web address]: 08/31/2021Prepared by: Jessica DonohoExercisesMini Squat with Counter Support - 2 x daily - 7 x weekly - 2 sets - 10 repsStanding March with Counter Support - 2 x daily - 7 x weekly - 3 sets - 10 repsStanding Heel Raise with Support - 2 x daily - 7 x weekly - 3 sets - 10 repsStanding Hip Abduction with Counter Support - 2 x daily - 7 x weekly - 3 sets - 10 reps    Consulted and Agree with Plan of Care Patient           Patient will benefit from skilled therapeutic intervention in order to improve the following deficits and impairments:  Abnormal gait, Decreased balance, Decreased endurance, Difficulty walking, Impaired sensation, Pain, Impaired flexibility, Decreased strength, Decreased activity tolerance  Visit Diagnosis: Chronic bilateral low back pain, unspecified whether sciatica present  Muscle weakness (generalized)  Other abnormalities of gait and mobility     Problem List Patient Active Problem List   Diagnosis Date Noted  . Insomnia 10/29/2019  . Recurrent falls 09/06/2019  . Confusion, postoperative   . Benign essential HTN   . Post-operative pain   . Chronic constipation   . 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 Post op infection 07/11/2019  . Muscle spasm   . Hypokalemia   . Status post lumbar spinal fusion  07/10/2019  . Postoperative pain   . Acute blood loss anemia   . Labile blood pressure   . Tobacco abuse   . Hypoalbuminemia due to protein-calorie malnutrition (HCC)   . Anxiety 07/05/2019  . Depression 07/05/2019  . Chronic diastolic CHF (congestive heart failure) (HCC) 07/05/2019  . Obesity (BMI 30-39.9) 07/05/2019  . ETOH abuse 07/05/2019  . Lumbar radiculopathy 06/28/2019  . Spinal stenosis of lumbar region 06/28/2019  . Acute on chronic anemia   . Tremors of nervous system   . AKI (acute kidney injury) (HCC)   . Chronic pain syndrome   . Acute renal failure superimposed on stage 3 chronic kidney disease (HCC) 06/25/2019  . Acute osteomyelitis of lumbar spine (HCC) 06/25/2019  . Weakness of left lower extremity   . Abdominal pain 06/24/2019  . Nausea 06/24/2019  . Abnormal urine odor 06/24/2019  . History of sepsis 06/24/2019  . Recent major surgery 06/24/2019  . History of diverticulosis 06/24/2019  . Elevated PTHrP level 05/09/2019  . Paresthesia 02/12/2019  . Low back pain 02/12/2019  . Neuropathy 12/20/2018  . Lumbar spinal stenosis 06/29/2018  . Preoperative clearance 06/04/2018  . Abnormal CT of the chest 06/04/2018  . Smoker 05/17/2018  . Hypothyroidism 11/30/2017  . Thoracic aortic atherosclerosis (HCC) 09/14/2016  . Benign essential tremor 09/09/2016  . RLS (restless legs syndrome) 09/09/2016  . H/O adenomatous polyp of colon 07/06/2016  . PVC's (premature ventricular contractions) 03/29/2016  . Preoperative cardiovascular examination 03/29/2016  . Grade I diastolic dysfunction 02/01/2016  . Palpitations 01/11/2016  . Shortness of breath 01/11/2016  . Former cigarette smoker 01/11/2016  . Leg edema 01/11/2016  . Chronic cough 10/28/2015  . 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 Malignant neoplasm of upper-inner quadrant of left breast in female, estrogen receptor positive (HCC) 07/14/2015  . Prediabetes 07/08/2015  . CKD (chronic kidney disease),  stage III 07/08/2015  . Dependent edema 06/08/2015  . Sleep disturbance 06/08/2015  . Mixed hyperlipidemia 06/08/2015  . Generalized anxiety disorder 06/08/2015  . Essential hypertension 06/08/2015  . COPD (chronic obstructive pulmonary disease) (HCC) 06/08/2015  . Gout 06/08/2015  .   Macrocytic anemia   . 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HCC)   . Sepsis (HCC) 05/15/2015  . Lumbar spondylosis 03/20/2015     , PT, DPT 12/19/19 3:24 PM  [facility] [address] [place], [place], [address] Phone: [phone]   Fax:  [phone]  Name: Brandi Stephens MRN: [id number] Date of Birth: 08/22/1945   

## 2019-12-20 ENCOUNTER — Encounter: Payer: PPO | Admitting: Physical Therapy

## 2019-12-24 ENCOUNTER — Other Ambulatory Visit: Payer: Self-pay

## 2019-12-24 ENCOUNTER — Ambulatory Visit: Payer: PPO | Admitting: Physical Therapy

## 2019-12-24 DIAGNOSIS — M545 Low back pain, unspecified: Secondary | ICD-10-CM

## 2019-12-24 DIAGNOSIS — M6281 Muscle weakness (generalized): Secondary | ICD-10-CM

## 2019-12-24 DIAGNOSIS — M5416 Radiculopathy, lumbar region: Secondary | ICD-10-CM

## 2019-12-24 DIAGNOSIS — R2689 Other abnormalities of gait and mobility: Secondary | ICD-10-CM

## 2019-12-24 NOTE — Therapy (Signed)
[place], [place], [address] Phone: [phone]   Fax:  [phone]  Physical Therapy Treatment  Patient Details  Name: Brandi Stephens MRN: [id number] Date of Birth: 04-28-1945 Referring Provider (PT): Leeroy Cha, MD   Encounter Date: 12/24/2019   PT End of Session - 12/24/19 1322    Visit Number 8    Number of Visits 18    Date for PT Re-Evaluation 01/30/20    Authorization Type [place] [employer], next progress note by visit 17    PT Start Time 1317    PT Stop Time 1340    PT Time Calculation (min) 23 min           Past Medical History:  Diagnosis Date  . AIN (anal intraepithelial neoplasia) anal canal   . Anemia    with pregnancy  . Anxiety   . Arthritis    knees, lower back, knees  . Chronic constipation   . Chronic diastolic CHF (congestive heart failure) ([place]) 07/05/2019  . CKD (chronic kidney disease)   . Coarse tremors    Essential  . COPD (chronic obstructive pulmonary disease) ([place])    2017 chest CT, history of  . Degenerative lumbar spinal stenosis   . Depression   . Depression 07/05/2019  . Drug dependency ([place])   . Elevated PTHrP level 05/09/2019  . ETOH abuse 07/05/2019  . GERD (gastroesophageal reflux disease)    pt denies  . Gout   . Grade I diastolic dysfunction 13/24/4010   Noted ECHO  . Hemorrhoids   . History of adenomatous polyp of colon   . History of basal cell carcinoma (BCC) excision 2015   nose  . History of sepsis 05/14/2014   post lumbar surgery  . History of shingles    x2  . Hyperlipidemia   . Hypertension   . Hypothyroidism   . Irregular heart beat    history of while undergoing radiation  . Malignant neoplasm of upper-inner quadrant of left breast in female, estrogen receptor positive [facility]) oncologist-  dr Jana Hakim--  per lov in epic no recurrence   dx 07-13-2015  Left breast invasive ductal carcinoma, Stage IA, Grade 1 (TXN0),  09-16-2015  s/p  left  breast lumpectomy with sln bx's,  completed radiation 11-18-2015,  started antiestrogen therapy 12-14-2015  . Neuropathy    Left foot and back of left leg  . Obesity (BMI 30-39.9) 07/05/2019  . Personal history of radiation therapy    completed 11-18-2015  left breast  . Pneumonia   . PONV (postoperative nausea and vomiting)   . Prediabetes    diet controlled, no med  . Restless leg syndrome   . Seasonal allergies   . Seizure ([place]) 05/2015   due to sepsis, only one time episode  . Tremor   . Wears partial dentures    lower     Past Surgical History:  Procedure Laterality Date  . ABDOMINAL HYSTERECTOMY  1985  . BASAL CELL CARCINOMA EXCISION  10/15   nose  . BREAST BIOPSY Right 08/24/2015  . BREAST BIOPSY Right 07/13/2015  . BREAST BIOPSY Left 07/13/2015  . BREAST EXCISIONAL BIOPSY Left 1972  . BREAST LUMPECTOMY Left   . BREAST LUMPECTOMY WITH RADIOACTIVE SEED AND SENTINEL LYMPH NODE BIOPSY Left 09/16/2015   Procedure: BREAST LUMPECTOMY WITH RADIOACTIVE SEED AND SENTINEL LYMPH NODE BIOPSY;  Surgeon: Stark Klein, MD;  Location: [facility];  Service: General;  Laterality: Left;  . BREAST  REDUCTION SURGERY Bilateral 1998  . CATARACT EXTRACTION W/ INTRAOCULAR LENS IMPLANT Left 2016  . [place]   hallo  . COLONOSCOPY  01/2013   polyps  . EYE SURGERY Left 2016  . HAND SURGERY Left 02-19-2002   dr Fredna Dow  '@[email or web address]'    repair collateral ligament/ MPJ left thumb  . HARDWARE REMOVAL N/A 07/24/2019   Procedure: REVISION OF HARDWARE Lumbar two - Sacral one. Extention of Fusion to Lumbar one;  Surgeon: [name], MD;  Location: [facility];  Service: Neurosurgery;  Laterality: N/A;  . HEMORRHOID SURGERY  2008  . HIGH RESOLUTION ANOSCOPY N/A 02/28/2018   Procedure: HIGH RESOLUTION ANOSCOPY WITH BIOPSY;  Surgeon: Leighton Ruff, MD;  Location: [facility];  Service: General;  Laterality: N/A;  . KNEE ARTHROSCOPY Right 2002  . LUMBAR SPINE  SURGERY  03-20-2015   dr Kathyrn Sheriff   fusion L4-5, L5-S1  . MOUTH SURGERY  07/14/2018   2 infected teeth removed  . MULTIPLE TOOTH EXTRACTIONS     with bone grafting lower bottom right and left  . REDUCTION MAMMAPLASTY Bilateral   . RIGHT COLECTOMY  09-10-2003   dr Margot Chimes '@[email or web address]'    multiple colon polyps (per path tubular adenoma's, hyperplastic , benign appendix, benign two lymph nodes  . ROTATOR CUFF REPAIR Left 04/2016  . TONSILLECTOMY  1969  . TUBAL LIGATION Bilateral 1982    There were no vitals filed for this visit.   Subjective Assessment - 12/24/19 1325    Subjective Brandi Stephens arrives reporting a fall 1.5 hours before arriving to her appointment. She was sitting edge of bed and dropped pills underneath herself. She slid off the bed into the floor , knee into trash can and twisted her back. She was able to get down the stairs on her own prior to PT.    Currently in Pain? Yes    Pain Score 8     Pain Location Back    Pain Orientation Lower    Pain Descriptors / Indicators Sharp;Stabbing    Pain Type Chronic pain    Aggravating Factors  falling    Pain Relieving Factors ice                             [facility] Adult PT Treatment/Exercise - 12/24/19 0001      Lumbar Exercises: Stretches   Lower Trunk Rotation 10 seconds    Lower Trunk Rotation Limitations 10 reps, cues to keep comfortable     Other Lumbar Stretch Exercise gentle passive knee to chest x 2 each      Lumbar Exercises: Aerobic   Nustep L2 UE/LE x 5 minutes       Lumbar Exercises: Supine   Bent Knee Raise 20 reps                    PT Short Term Goals - 12/19/19 1432      PT SHORT TERM GOAL #1   Title Independent with HEP    Baseline met for initial HEP, will update prn    Time 3    Period Weeks    Status Achieved      PT SHORT TERM GOAL #2   Title Improve TUG time (with RW) at least 3-5 sec for decreased fall risk    Baseline 13 sec-improved from baseline but goal still  ongoing    Time 3    Period Weeks  Status On-going    Target Date 01/09/20      PT SHORT TERM GOAL #3   Title Ambulate at least 100 feet CGA with SPC for progression to LTG for gait    Baseline met/able    Time 3    Period Weeks    Status Achieved             PT Long Term Goals - 12/19/19 1434      PT LONG TERM GOAL #1   Title Improve [place] Gait + Balance at least 3-5 points for decreased fall risk    Baseline 19/28    Time 6    Period Weeks    Status On-going    Target Date 01/30/20      PT LONG TERM GOAL #2   Title Increase left hip and ankle strength at least grossly 1/2 MMT grade to improve ability to ascend/descend stairs to 2nd floor bedroom    Baseline see objective-partially met    Time 6    Period Weeks    Status Partially Met    Target Date 01/30/20      PT LONG TERM GOAL #3   Title Perform household level ambulation 150 feet or less mod I with SPC vs. LRAD    Baseline progressing in therapy with gait with SPC but continues with RW use    Time 6    Period Weeks    Status On-going    Target Date 01/30/20      PT LONG TERM GOAL #4   Title Improve 5 times sit<>stand time to 14 sce or less for improved ability for transfers from low seats    Baseline met-11.7 seconds 12/19/19    Time 6    Period Weeks    Status Achieved      PT LONG TERM GOAL #5   Title Tolerate standing for periods at least 15-20 min for cooking and light chores with LBP 4/10 or less    Baseline still ongoing with higher pain level    Time 6    Period Weeks    Status On-going    Target Date 01/30/20                 Plan - 12/24/19 1344    Clinical Impression Statement Pt arrives to PT reporting fall from edge of bed onto floor which caused increase back and knee pain. Pt. attempted to participate in session with Nustep and gentle mat therex but noted increased pain. Session discontinued due to pain.    PT Next Visit Plan how is she since fall? monitor vitals/O2 sats,  continue gait with SPC,  standing exercise/functional activity progression, continue core + LE strengthening, check status for hemorrhoid pain before retrying supine exercises    PT Home Exercise Plan Access Code: [email or web address]: [email or web address]: 08/31/2021Prepared by: Caesar Bookman Squat with Counter Support - 2 x daily - 7 x weekly - 2 sets - 10 repsStanding March with Counter Support - 2 x daily - 7 x weekly - 3 sets - 10 repsStanding Heel Raise with Support - 2 x daily - 7 x weekly - 3 sets - 10 repsStanding Hip Abduction with Counter Support - 2 x daily - 7 x weekly - 3 sets - 10 reps           Patient will benefit from skilled therapeutic intervention in order to improve the following deficits and impairments:  Abnormal gait, Decreased balance, Decreased endurance, Difficulty walking, Impaired sensation, Pain,  Impaired flexibility, Decreased strength, Decreased activity tolerance  Visit Diagnosis: Chronic bilateral low back pain, unspecified whether sciatica present  Muscle weakness (generalized)  Other abnormalities of gait and mobility  Lumbar radiculopathy     Problem List Patient Active Problem List   Diagnosis Date Noted  . Insomnia 10/29/2019  . Recurrent falls 09/06/2019  . Confusion, postoperative   . Benign essential HTN   . Post-operative pain   . Chronic constipation   . 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 Post op infection 07/11/2019  . Muscle spasm   . Hypokalemia   . Status post lumbar spinal fusion 07/10/2019  . Postoperative pain   . Acute blood loss anemia   . Labile blood pressure   . Tobacco abuse   . Hypoalbuminemia due to protein-calorie malnutrition ([place])   . Anxiety 07/05/2019  . Depression 07/05/2019  . Chronic diastolic CHF (congestive heart failure) ([place]) 07/05/2019  . Obesity (BMI 30-39.9) 07/05/2019  . ETOH abuse 07/05/2019  . Lumbar radiculopathy 06/28/2019  . Spinal stenosis of lumbar region  06/28/2019  . Acute on chronic anemia   . Tremors of nervous system   . AKI (acute kidney injury) ([place])   . Chronic pain syndrome   . Acute renal failure superimposed on stage 3 chronic kidney disease ([place]) 06/25/2019  . Acute osteomyelitis of lumbar spine ([place]) 06/25/2019  . Weakness of left lower extremity   . Abdominal pain 06/24/2019  . Nausea 06/24/2019  . Abnormal urine odor 06/24/2019  . History of sepsis 06/24/2019  . Recent major surgery 06/24/2019  . History of diverticulosis 06/24/2019  . Elevated PTHrP level 05/09/2019  . Paresthesia 02/12/2019  . Low back pain 02/12/2019  . Neuropathy 12/20/2018  . Lumbar spinal stenosis 06/29/2018  . Preoperative clearance 06/04/2018  . Abnormal CT of the chest 06/04/2018  . Smoker 05/17/2018  . Hypothyroidism 11/30/2017  . Thoracic aortic atherosclerosis ([place]) 09/14/2016  . Benign essential tremor 09/09/2016  . RLS (restless legs syndrome) 09/09/2016  . H/O adenomatous polyp of colon 07/06/2016  . PVC's (premature ventricular contractions) 03/29/2016  . Preoperative cardiovascular examination 03/29/2016  . Grade I diastolic dysfunction 36/72/5500  . Palpitations 01/11/2016  . Shortness of breath 01/11/2016  . Former cigarette smoker 01/11/2016  . Leg edema 01/11/2016  . Chronic cough 10/28/2015  . 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 Malignant neoplasm of upper-inner quadrant of left breast in female, estrogen receptor positive ([place]) 07/14/2015  . Prediabetes 07/08/2015  . CKD (chronic kidney disease), stage III 07/08/2015  . Dependent edema 06/08/2015  . Sleep disturbance 06/08/2015  . Mixed hyperlipidemia 06/08/2015  . Generalized anxiety disorder 06/08/2015  . Essential hypertension 06/08/2015  . COPD (chronic obstructive pulmonary disease) ([place]) 06/08/2015  . Gout 06/08/2015  . Macrocytic anemia   . 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 Sepsis ([place]) 05/15/2015  . Lumbar spondylosis 03/20/2015    Dorene Ar, PTA 12/24/2019, 1:47 PM  [facility] [address] [place], [place], [address] Phone: [phone]   Fax:  [phone]  Name: Brandi Stephens MRN: [id number] Date of Birth: [birth year]

## 2019-12-25 DIAGNOSIS — L920 Granuloma annulare: Secondary | ICD-10-CM | POA: Diagnosis not present

## 2019-12-25 DIAGNOSIS — Z85828 Personal history of other malignant neoplasm of skin: Secondary | ICD-10-CM | POA: Diagnosis not present

## 2019-12-26 ENCOUNTER — Other Ambulatory Visit: Payer: Self-pay | Admitting: Oncology

## 2019-12-26 ENCOUNTER — Ambulatory Visit: Payer: PPO | Admitting: Physical Therapy

## 2019-12-26 ENCOUNTER — Other Ambulatory Visit: Payer: Self-pay | Admitting: Physical Medicine and Rehabilitation

## 2019-12-30 ENCOUNTER — Encounter: Payer: Self-pay | Admitting: Neurology

## 2019-12-30 ENCOUNTER — Ambulatory Visit: Payer: PPO | Admitting: Neurology

## 2019-12-30 ENCOUNTER — Other Ambulatory Visit: Payer: Self-pay

## 2019-12-30 VITALS — BP 140/82 | HR 75 | Ht 67.0 in | Wt 251.4 lb

## 2019-12-30 DIAGNOSIS — R202 Paresthesia of skin: Secondary | ICD-10-CM

## 2019-12-30 DIAGNOSIS — Z6834 Body mass index (BMI) 34.0-34.9, adult: Secondary | ICD-10-CM | POA: Diagnosis not present

## 2019-12-30 DIAGNOSIS — I10 Essential (primary) hypertension: Secondary | ICD-10-CM | POA: Diagnosis not present

## 2019-12-30 DIAGNOSIS — M545 Low back pain: Secondary | ICD-10-CM | POA: Diagnosis not present

## 2019-12-30 NOTE — Progress Notes (Signed)
[facility]  [phone]) [phone] [phone]) [phone]     ID: Brandi Stephens DOB: [birth year]  MR#: [id number]  NIO#:[id number]  Patient Care Team: Girtha Rm, NP-C as PCP - General (Family Medicine) Angelle Isais, Virgie Dad, MD as Consulting Physician (Oncology) Stark Klein, MD as Consulting Physician (General Surgery) [name], MD as Consulting Physician (Family Medicine) [name], MD as Consulting Physician (Neurosurgery) Griselda Miner, MD as Consulting Physician (Dermatology) Delice Bison, Charlestine Massed, NP as Nurse Practitioner (Hematology and Oncology) Marchia Bond, MD as Consulting Physician (Orthopedic Surgery) Clydell Hakim, MD as Consulting Physician (Anesthesiology) Armbruster, Carlota Raspberry, MD as Consulting Physician (Gastroenterology) [name], MD as Consulting Physician (Neurosurgery) Marcial Pacas, MD as Consulting Physician (Neurology) OTHER MD:  CHIEF COMPLAINT: Estrogen receptor positive breast cancer  CURRENT TREATMENT: Anastrozole   INTERVAL HISTORY: Brandi Stephens returns today for follow-up of her estrogen receptor positive breast cancer accompanied by her daughter Brandi Stephens.   [name] has had a very hard year, complicated by back surgery, staph infections, repeated need for physical therapy, and chronic pain.  She is slowly making progress out of the more S and is now getting around using a walker although still needs quite a bit of assistance.  She continued her anastrozole right through all those problems.  She is tolerating it well, with some heat intolerance and hot flashes as her main side effect  Most recent mammogram was 12/06/2018 at the [facility] showing breast density category C with no evidence of malignancy.   REVIEW OF SYSTEMS: Brandi Stephens tells me her pain is constant.  She takes hydrocodone occasionally to be able to participate in physical therapy.  Otherwise she takes Tylenol.  She is currently not  constipated from the medication.  She has had no unusual headaches visual changes cough phlegm production or pleurisy.  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  Detailed review of systems today was otherwise noncontributory     BREAST CANCER HISTORY: From the original intake note:  Brandi Stephens started to feel some right breast pain and soreness in March 2017. She brought it to Dr. Lanice Shirts nurse practitioner, Loletha Carrow and since attention, and she was set up for bilateral diagnostic mammography with tomography at the [facility] 07/02/2015 area did the breast density was category C. The patient is status post bilateral reduction mammoplasties. Mammography showed no suspicious masses or calcifications in the right breast, and ultrasonography of the area where she has discomfort shows an area of distortion in the right breast at the 8:00 position 12 cm from the nipple measuring 1.4 cm thought to represent scarring related to the prior reduction surgery, but warranting further evaluation.  Also, in the left breast there was an area of distortion in the upper inner quadrant noted mammographically. Targeted ultrasound evaluating the left breast found no sonographic correlate to the mammographic findings. Ultrasound evaluation of both axillae were benign.  On 07/13/2015 Brandi Stephens underwent biopsy of the right breast area in question which showed only fibrocystic changes with adenosis. Biopsy of the left breast upper inner quadrant however showed invasive ductal carcinoma, grade 1, estrogen receptor 90% positive, progesterone receptor 70% positive, WITH strong staining intensity, with an MIB-1 of 3%, and no HER-2 amplification, the signals ratio being 0.97 and the number per cell 1.65.  The patient's subsequent history is as detailed below   PAST MEDICAL HISTORY: Past Medical History:  Diagnosis Date  . AIN (anal intraepithelial neoplasia) anal canal   . Anemia    with pregnancy  . Anxiety   .  Arthritis     knees, lower back, knees  . Chronic constipation   . Chronic diastolic CHF (congestive heart failure) ([place]) 07/05/2019  . CKD (chronic kidney disease)   . Coarse tremors    Essential  . COPD (chronic obstructive pulmonary disease) ([place])    2017 chest CT, history of  . Degenerative lumbar spinal stenosis   . Depression   . Depression 07/05/2019  . Drug dependency ([place])   . Elevated PTHrP level 05/09/2019  . ETOH abuse 07/05/2019  . GERD (gastroesophageal reflux disease)    pt denies  . Gout   . Grade I diastolic dysfunction 01/00/7121   Noted ECHO  . Hemorrhoids   . History of adenomatous polyp of colon   . History of basal cell carcinoma (BCC) excision 2015   nose  . History of sepsis 05/14/2014   post lumbar surgery  . History of shingles    x2  . Hyperlipidemia   . Hypertension   . Hypothyroidism   . Irregular heart beat    history of while undergoing radiation  . Malignant neoplasm of upper-inner quadrant of left breast in female, estrogen receptor positive [facility]) oncologist-  dr Jana Hakim--  per lov in epic no recurrence   dx 07-13-2015  Left breast invasive ductal carcinoma, Stage IA, Grade 1 (TXN0),  09-16-2015  s/p  left breast lumpectomy with sln bx's,  completed radiation 11-18-2015,  started antiestrogen therapy 12-14-2015  . Neuropathy    Left foot and back of left leg  . Obesity (BMI 30-39.9) 07/05/2019  . Personal history of radiation therapy    completed 11-18-2015  left breast  . Pneumonia   . PONV (postoperative nausea and vomiting)   . Prediabetes    diet controlled, no med  . Restless leg syndrome   . Seasonal allergies   . Seizure ([place]) 05/2015   due to sepsis, only one time episode  . Tremor   . Wears partial dentures    lower     PAST SURGICAL HISTORY: Past Surgical History:  Procedure Laterality Date  . ABDOMINAL HYSTERECTOMY  1985  . BASAL CELL CARCINOMA EXCISION  10/15   nose  . BREAST BIOPSY Right 08/24/2015  . BREAST BIOPSY Right  07/13/2015  . BREAST BIOPSY Left 07/13/2015  . BREAST EXCISIONAL BIOPSY Left 1972  . BREAST LUMPECTOMY Left   . BREAST LUMPECTOMY WITH RADIOACTIVE SEED AND SENTINEL LYMPH NODE BIOPSY Left 09/16/2015   Procedure: BREAST LUMPECTOMY WITH RADIOACTIVE SEED AND SENTINEL LYMPH NODE BIOPSY;  Surgeon: Stark Klein, MD;  Location: [facility];  Service: General;  Laterality: Left;  . BREAST REDUCTION SURGERY Bilateral 1998  . CATARACT EXTRACTION W/ INTRAOCULAR LENS IMPLANT Left 2016  . [place]   hallo  . COLONOSCOPY  01/2013   polyps  . EYE SURGERY Left 2016  . HAND SURGERY Left 02-19-2002   dr Fredna Dow  '@[facility]'    repair collateral ligament/ MPJ left thumb  . HARDWARE REMOVAL N/A 07/24/2019   Procedure: REVISION OF HARDWARE Lumbar two - Sacral one. Extention of Fusion to Lumbar one;  Surgeon: [name], MD;  Location: [facility];  Service: Neurosurgery;  Laterality: N/A;  . HEMORRHOID SURGERY  2008  . HIGH RESOLUTION ANOSCOPY N/A 02/28/2018   Procedure: HIGH RESOLUTION ANOSCOPY WITH BIOPSY;  Surgeon: Leighton Ruff, MD;  Location: [facility];  Service: General;  Laterality: N/A;  . KNEE ARTHROSCOPY Right 2002  . LUMBAR SPINE SURGERY  03-20-2015   dr  nundkumar   fusion L4-5, L5-S1  . MOUTH SURGERY  07/14/2018   2 infected teeth removed  . MULTIPLE TOOTH EXTRACTIONS     with bone grafting lower bottom right and left  . REDUCTION MAMMAPLASTY Bilateral   . RIGHT COLECTOMY  09-10-2003   dr Margot Chimes '@[facility]'    multiple colon polyps (per path tubular adenoma's, hyperplastic , benign appendix, benign two lymph nodes  . ROTATOR CUFF REPAIR Left 04/2016  . TONSILLECTOMY  1969  . TUBAL LIGATION Bilateral 1982    FAMILY HISTORY Family History  Problem Relation Age of Onset  . Atrial fibrillation Mother   . Ulcers Father   . Breast cancer Maternal Aunt   . Lung cancer Maternal Uncle   . Stomach cancer Maternal Grandmother   . Lung cancer Maternal Aunt   .  Brain cancer Maternal Aunt   . Prostate cancer Maternal Grandfather   . Stroke Paternal Grandmother   . Tuberculosis Paternal Grandfather   . Colon cancer Neg Hx   . Esophageal cancer Neg Hx   . Rectal cancer Neg Hx   The patient's father died at the age of 39 and her mother at the age of 48. The patient had no brothers, 2 sisters. The patient's mother was diagnosed with uterine cancer at the age of 36. The patient's sister was diagnosed with labial cancer at the age of 82. There is a maternal aunt with a history of breast cancer but the patient does not know at what age she was diagnosed. There is also on the mother's side history of lung cancer brain cancer and stomach cancer.    GYNECOLOGIC HISTORY:  No LMP recorded. Patient has had a hysterectomy.  Menarche age 75 first live birth age 35, the patient is [place] P3. She underwent hysterectomy without salpingo-oophorectomy at age 21. She took hormone replacement only for a few months.    SOCIAL HISTORY:   the patient is a retired Architect. Her husband     is also retired. He used to work for [employer]. Daughter Brandi Stephens lives in [place] and she is a Wellsite geologist, currently not employed. Daughter [name] lives in [place] and has a Brewing technologist in social work but is not currently working. Son [name] died at age 57 from causes not clear after autopsy. He had had significant neurologic damage following an accident . She has one grandchild.    ADVANCED DIRECTIVES:  not in place    HEALTH MAINTENANCE: Social History   Tobacco Use  . Smoking status: Former Smoker    Packs/day: 0.50    Years: 35.00    Pack years: 17.50    Types: Cigarettes    Quit date: 05/22/2018    Years since quitting: 1.6  . Smokeless tobacco: Never Used  Vaping Use  . Vaping Use: Never used  Substance Use Topics  . Alcohol use: Yes    Comment: 1 glass of wine a night  . Drug use: Not Currently    Comment: CBD tincture     Colonoscopy:  2015/[facility]   PAP:  Bone density: 2016  Lipid panel:  Allergies  Allergen Reactions  . Ampicillin Hives and Other (See Comments)    Severe reaction in February 2017 1.5 month to have hives to go away  . Gabapentin Swelling    UNSPECIFIED REACTION   . Penicillins Hives and Other (See Comments)    Severe reaction in February 2017 1.5 month to have hives to go away Has patient had a PCN  reaction causing immediate rash, facial/tongue/throat swelling, SOB or lightheadedness with hypotension: #  #  #  YES  #  #  #  Has patient had a PCN reaction causing severe rash involving mucus membranes or skin necrosis: No Has patient had a PCN reaction that required hospitalization No Has patient had a PCN reaction occurring within the last 10 years: No   . Codeine Nausea And Vomiting  . Other Itching    UNSPECIFIED Analgesics    Current Outpatient Medications  Medication Sig Dispense Refill  . acetaminophen (TYLENOL) 325 MG tablet Take 2 tablets (650 mg total) by mouth every 6 (six) hours as needed for moderate pain. 20 tablet 0  . albuterol (VENTOLIN HFA) 108 (90 Base) MCG/ACT inhaler INHALE 2 PUFFS BY MOUTH EVERY 4 TO 6 HOURS AS NEEDED 42.5 g 0  . allopurinol (ZYLOPRIM) 300 MG tablet TAKE 1 TABLET(300 MG) BY MOUTH DAILY 90 tablet 0  . amitriptyline (ELAVIL) 50 MG tablet Take 1 tablet (50 mg total) by mouth at bedtime. 30 tablet 3  . anastrozole (ARIMIDEX) 1 MG tablet TAKE 1 TABLET(1 MG) BY MOUTH DAILY 90 tablet 0  . atorvastatin (LIPITOR) 20 MG tablet Take 1 tablet (20 mg total) by mouth daily. 90 tablet 1  . cephALEXin (KEFLEX) 500 MG capsule Take 1 capsule (500 mg total) by mouth 3 (three) times daily. 90 capsule 5  . diazepam (VALIUM) 5 MG tablet Take 1 tablet (5 mg total) by mouth every 6 (six) hours as needed for muscle spasms. 60 tablet 0  . diclofenac Sodium (VOLTAREN) 1 % GEL Apply 4 g topically 4 (four) times daily.    Marland Kitchen docusate sodium (COLACE) 100 MG capsule Take 1 capsule (100 mg  total) by mouth 3 (three) times daily. 10 capsule 0  . DULoxetine (CYMBALTA) 60 MG capsule TAKE 1 CAPSULE(60 MG) BY MOUTH DAILY 30 capsule 2  . furosemide (LASIX) 20 MG tablet TAKE 1 TABLET(20 MG) BY MOUTH DAILY AS NEEDED 30 tablet 3  . levothyroxine (SYNTHROID) 75 MCG tablet Take 1 tablet (75 mcg total) by mouth daily before breakfast. 90 tablet 1  . magnesium gluconate (MAGONATE) 500 MG tablet Take 2 tablets (1,000 mg total) by mouth at bedtime. 60 tablet 0  . metoprolol succinate (TOPROL-XL) 25 MG 24 hr tablet Take 1 tablet (25 mg total) by mouth daily. 90 tablet 3  . ondansetron (ZOFRAN) 4 MG tablet Take 1 tablet (4 mg total) by mouth daily as needed for nausea or vomiting. 30 tablet 1  . polyethylene glycol powder (MIRALAX) powder Take 17 g by mouth daily as needed for moderate constipation.     . primidone (MYSOLINE) 50 MG tablet Take 1 tablet (50 mg total) by mouth 2 (two) times daily. TAKE 1 TABLET(50 MG) BY MOUTH AT BEDTIME 180 tablet 3  . Probiotic Product (PROBIOTIC DAILY PO) Take by mouth.    . QUEtiapine (SEROQUEL) 25 MG tablet Take 1 tablet (25 mg total) by mouth at bedtime. 90 tablet 0  . rOPINIRole (REQUIP) 0.25 MG tablet Take 3 tablets (0.75 mg total) by mouth at bedtime. 270 tablet 0  . vitamin B-12 (CYANOCOBALAMIN) 1000 MCG tablet Take 1 tablet (1,000 mcg total) by mouth daily. 30 tablet 0   No current facility-administered medications for this visit.    OBJECTIVE: White woman using a walker  Vitals:   12/31/19 1345  BP: (!) 161/87  Pulse: (!) 110  Resp: 18  Temp: 97.7 F (36.5 C)  SpO2: 95%  Body mass index is 39.22 kg/m.    ECOG FS:2 - Symptomatic, <50% confined to bed Filed Weights   12/31/19 1345  Weight: 250 lb 6.4 oz (113.6 kg)    Sclerae unicteric, EOMs intact Wearing a mask No cervical or supraclavicular adenopathy Lungs no rales or rhonchi Heart regular rate and rhythm Abd soft, obese, nontender, positive bowel sounds MSK no focal spinal  tenderness to mild palpation Neuro: nonfocal, well oriented, forward-looking affect Breasts: The right breast is unremarkable.  The left breast has undergone lumpectomy and radiation.  There is no evidence of disease recurrence.  Both axillae are benign.   LAB RESULTS:  CMP     Component Value Date/Time   NA 140 09/06/2019 1342   NA 142 06/28/2016 1458   K 4.8 09/06/2019 1342   K 5.2 (H) 06/28/2016 1458   CL 101 09/06/2019 1342   CO2 23 09/06/2019 1342   CO2 24 06/28/2016 1458   GLUCOSE 162 (H) 09/06/2019 1342   GLUCOSE 124 (H) 08/05/2019 0422   GLUCOSE 114 06/28/2016 1458   BUN 14 09/06/2019 1342   BUN 23.6 06/28/2016 1458   CREATININE 0.76 09/06/2019 1342   CREATININE 0.97 07/08/2016 1042   CREATININE 1.0 06/28/2016 1458   CALCIUM 10.2 09/06/2019 1342   CALCIUM 10.3 06/28/2016 1458   PROT 6.6 09/06/2019 1342   PROT 6.8 06/28/2016 1458   ALBUMIN 4.3 09/06/2019 1342   ALBUMIN 3.6 06/28/2016 1458   AST 15 09/06/2019 1342   AST 18 06/28/2016 1458   ALT 7 09/06/2019 1342   ALT 22 06/28/2016 1458   ALKPHOS 114 09/06/2019 1342   ALKPHOS 101 06/28/2016 1458   BILITOT <0.2 09/06/2019 1342   BILITOT 0.48 06/28/2016 1458   GFRNONAA 78 09/06/2019 1342   GFRAA 90 09/06/2019 1342    INo results found for: SPEP, UPEP  Lab Results  Component Value Date   WBC 8.6 12/31/2019   NEUTROABS 5.5 12/31/2019   HGB 12.7 12/31/2019   HCT 39.8 12/31/2019   MCV 93.9 12/31/2019   PLT 237 12/31/2019      Chemistry      Component Value Date/Time   NA 140 09/06/2019 1342   NA 142 06/28/2016 1458   K 4.8 09/06/2019 1342   K 5.2 (H) 06/28/2016 1458   CL 101 09/06/2019 1342   CO2 23 09/06/2019 1342   CO2 24 06/28/2016 1458   BUN 14 09/06/2019 1342   BUN 23.6 06/28/2016 1458   CREATININE 0.76 09/06/2019 1342   CREATININE 0.97 07/08/2016 1042   CREATININE 1.0 06/28/2016 1458      Component Value Date/Time   CALCIUM 10.2 09/06/2019 1342   CALCIUM 10.3 06/28/2016 1458   ALKPHOS  114 09/06/2019 1342   ALKPHOS 101 06/28/2016 1458   AST 15 09/06/2019 1342   AST 18 06/28/2016 1458   ALT 7 09/06/2019 1342   ALT 22 06/28/2016 1458   BILITOT <0.2 09/06/2019 1342   BILITOT 0.48 06/28/2016 1458       No results found for: LABCA2  No components found for: LABCA125  No results for input(s): INR in the last 168 hours.  Urinalysis    Component Value Date/Time   COLORURINE AMBER (A) 07/04/2019 1300   APPEARANCEUR CLOUDY (A) 07/04/2019 1300   LABSPEC 1.026 07/04/2019 1300   LABSPEC 1.025 06/24/2019 1426   PHURINE 5.0 07/04/2019 1300   GLUCOSEU NEGATIVE 07/04/2019 1300   HGBUR NEGATIVE 07/04/2019 1300   BILIRUBINUR MODERATE (A) 07/04/2019 1300   BILIRUBINUR  moderate (A) 06/24/2019 1426   BILIRUBINUR n 11/25/2015 1712   KETONESUR NEGATIVE 07/04/2019 1300   PROTEINUR NEGATIVE 07/04/2019 1300   UROBILINOGEN negative 11/25/2015 1712   NITRITE NEGATIVE 07/04/2019 1300   LEUKOCYTESUR MODERATE (A) 07/04/2019 1300     ELIGIBLE FOR AVAILABLE RESEARCH PROTOCOL: no  STUDIES: No results found.   ASSESSMENT: 74 y.o. [place] woman  Status post left breast upper inner quadrant biopsy 07/13/2015 for a clinical [place] N0 invasive ductal carcinoma, grade 1, estrogen and progesterone receptor positive, HER-2 nonamplified, with an MIB-1 of 3%.  (a) biopsy of a suspicious lesion in the right breast proved to be a small fibroadenoma  (1) left lumpectomy and sentinel lymph node sampling 09/16/2015 showed a pT1a pN0 invasive ductal carcinoma, grade 1, with negative margins, stage IA    (2) adjuvant radiation completed 11/18/2015  (3) anastrozole started 12/14/2015, interrupted August 2020, resumed OCT 2020  (a) bone density on 08/16/2016 showed a T score of of -2.0, osteopenia left hip, improved from prior 2002   PLAN: Kila is now a little over 4 years out from definitive surgery for her breast cancer with no evidence of disease recurrence.  This is very favorable.  She  continues on anastrozole, with good tolerance and the plan will be to continue that 1 more year.  She is a little bit behind on mammography and she is due for a bone density.  I have put both of those in to be done within the next month or so.  I asked her to call me if she had questions on the bone density in particular.  Otherwise I will see her again in 1 year and that likely will be her "graduation visit".  Total encounter time 25 minutes.*  Senetra Dillin, Virgie Dad, MD  12/31/19 2:19 PM Medical Oncology and Hematology [facility] [place], [place] [address] Tel. [phone]    Fax. [phone]   I, Wilburn Mylar, am acting as scribe for Dr. Virgie Dad. Karsten Howry.  I, Lurline Del MD, have reviewed the above documentation for accuracy and completeness, and I agree with the above.   *Total Encounter Time as defined by the Centers for Medicare and Medicaid Services includes, in addition to the face-to-face time of a patient visit (documented in the note above) non-face-to-face time: obtaining and reviewing outside history, ordering and reviewing medications, tests or procedures, care coordination (communications with other health care professionals or caregivers) and documentation in the medical record.

## 2019-12-30 NOTE — Progress Notes (Signed)
PATIENT: Brandi Stephens DOB: 10-Jun-1945  REASON FOR VISIT: follow up HISTORY FROM: patient  HISTORY OF PRESENT ILLNESS: Today 12/30/19  HISTORY  Brandi Stephens is a 74 year old female, seen in request by her primary care PA [place], [place], for evaluation of bilateral hands and feet burning pain, tremor, initial evaluation was February 12, 2019.  I have reviewed and summarized the referring note from the referring physician.  She had a past medical history of hyperlipidemia, gout, depression anxiety, hypothyroidism, essential tremor, seen by Dr. Jim Like in 2014, was treated with primidone 50 mg every night, which has been helpful  She had a history of lumbar decompression surgery twice, first 1 in 2015, presented with low back pain radiating pain to left lower extremity, surgery, she began to develop bilateral feet paresthesia, worsening at left foot, she had 34 arthrodesis by Dr. Kathyrn Sheriff on June 29, 2018, which has helped her low back pain, however bilateral feet paresthesia is gradually getting worse, especially at the ball of her left foot, difficulty bearing weight, sometimes with burning pain  I personally reviewed CT myelogram November 2019, critical stenosis at L3-4 secondary to shot pedicles, central disc protrusion, and posterior element hypertrophy, status post L4-5 PLIF.  She also had a history of cervical decompression surgery in 1995, she presented with left neck pain, radiating pain to left shoulder, she now complains of bilateral fingertips paresthesia, drop things from her hands, burning pain, MRI of cervical spine is pending on February 23, 2019.  Update December 30, 2019 SS: Is with her daughter, since last seen, She was hospitalized in February, underwent extensive L2 laminectomy, in March 2021, readmitted March for acute kidney injury and falling, found concerning infection at L2-3, was positive for staph, given 6 weeks of Ancef, had a revision of  fusion on July 24, 2019, she was admitted to inpatient rehab.  Dr. Kathyrn Sheriff did her surgery.   She is now seeing the rehab physician Dr. Ranell Patrick, being considered for [facility] P[facility], on amitriptyline 50 mg at bedtime, Cymbalta 60 mg daily.  Medications have been significantly helpful for bilateral hand paresthesia, continues with discomfort of burning to the left lower extremity.  Is in PT, given hydrocodone to help get through PT.  Lives with her husband, is accompanied with her daughter today, using walker.  Has had a few falls.  Saw Dr. Kathyrn Sheriff today, is overall stable, nothing more to do surgically.  MRI of the brain in November 2020, no acute intercranial abnormality, advanced chronic small vessel disease, generalized atrophy  MRI cervical spine March 31, 2019 IMPRESSION: 1. No MRI evidence for acute infection within the cervical spine. Note is made of a small joint effusion at the right C3-4 facet, felt to most likely be degenerative in nature. 2. Multilevel cervical spondylosis with resultant mild to moderate diffuse spinal stenosis at C3-4 through C5-6. 3. Multifactorial degenerative changes with resultant moderate to severe bilateral foraminal stenosis at C3-4 through C6-7 as above.  MRI lumbar spine March 31, 2019 IMPRESSION: 1. No MRI evidence for acute infection within the lumbar spine. 2. Postoperative changes from prior PLIF at L3-4 through L5-S1 without residual or recurrent stenosis. 3. Progressive adjacent segment disease at L2-3 with resultant severe canal with left greater than right lateral recess stenosis as Above.  REVIEW OF SYSTEMS: Out of a complete 14 system review of symptoms, the patient complains only of the following symptoms, and all other reviewed systems are negative.  Burning, walking difficulty  ALLERGIES:  Allergies  Allergen Reactions  . Ampicillin Hives and Other (See Comments)    Severe reaction in February 2017 1.5 month to have hives to go  away  . Gabapentin Swelling    UNSPECIFIED REACTION   . Penicillins Hives and Other (See Comments)    Severe reaction in February 2017 1.5 month to have hives to go away Has patient had a PCN reaction causing immediate rash, facial/tongue/throat swelling, SOB or lightheadedness with hypotension: #  #  #  YES  #  #  #  Has patient had a PCN reaction causing severe rash involving mucus membranes or skin necrosis: No Has patient had a PCN reaction that required hospitalization No Has patient had a PCN reaction occurring within the last 10 years: No   . Codeine Nausea And Vomiting  . Other Itching    UNSPECIFIED Analgesics    HOME MEDICATIONS: Outpatient Medications Prior to Visit  Medication Sig Dispense Refill  . acetaminophen (TYLENOL) 325 MG tablet Take 2 tablets (650 mg total) by mouth every 6 (six) hours as needed for moderate pain. 20 tablet 0  . albuterol (VENTOLIN HFA) 108 (90 Base) MCG/ACT inhaler INHALE 2 PUFFS BY MOUTH EVERY 4 TO 6 HOURS AS NEEDED 42.5 g 0  . allopurinol (ZYLOPRIM) 300 MG tablet TAKE 1 TABLET(300 MG) BY MOUTH DAILY 90 tablet 0  . amitriptyline (ELAVIL) 50 MG tablet Take 1 tablet (50 mg total) by mouth at bedtime. 30 tablet 3  . anastrozole (ARIMIDEX) 1 MG tablet TAKE 1 TABLET(1 MG) BY MOUTH DAILY 90 tablet 0  . atorvastatin (LIPITOR) 20 MG tablet Take 1 tablet (20 mg total) by mouth daily. 90 tablet 1  . cephALEXin (KEFLEX) 500 MG capsule Take 1 capsule (500 mg total) by mouth 3 (three) times daily. 90 capsule 5  . diazepam (VALIUM) 5 MG tablet Take 1 tablet (5 mg total) by mouth every 6 (six) hours as needed for muscle spasms. 60 tablet 0  . diclofenac Sodium (VOLTAREN) 1 % GEL Apply 4 g topically 4 (four) times daily.    Marland Kitchen docusate sodium (COLACE) 100 MG capsule Take 1 capsule (100 mg total) by mouth 3 (three) times daily. 10 capsule 0  . DULoxetine (CYMBALTA) 60 MG capsule TAKE 1 CAPSULE(60 MG) BY MOUTH DAILY 30 capsule 2  . furosemide (LASIX) 20 MG tablet  TAKE 1 TABLET(20 MG) BY MOUTH DAILY AS NEEDED 30 tablet 3  . levothyroxine (SYNTHROID) 75 MCG tablet Take 1 tablet (75 mcg total) by mouth daily before breakfast. 90 tablet 1  . magnesium gluconate (MAGONATE) 500 MG tablet Take 2 tablets (1,000 mg total) by mouth at bedtime. 60 tablet 0  . metoprolol succinate (TOPROL-XL) 25 MG 24 hr tablet Take 1 tablet (25 mg total) by mouth daily. 90 tablet 3  . ondansetron (ZOFRAN) 4 MG tablet Take 1 tablet (4 mg total) by mouth daily as needed for nausea or vomiting. 30 tablet 1  . polyethylene glycol powder (MIRALAX) powder Take 17 g by mouth daily as needed for moderate constipation.     . primidone (MYSOLINE) 50 MG tablet Take 1 tablet (50 mg total) by mouth 2 (two) times daily. TAKE 1 TABLET(50 MG) BY MOUTH AT BEDTIME 180 tablet 3  . Probiotic Product (PROBIOTIC DAILY PO) Take by mouth.    . QUEtiapine (SEROQUEL) 25 MG tablet Take 1 tablet (25 mg total) by mouth at bedtime. 90 tablet 0  . rOPINIRole (REQUIP) 0.25 MG tablet Take 3 tablets (0.75 mg total)  by mouth at bedtime. 270 tablet 0  . vitamin B-12 (CYANOCOBALAMIN) 1000 MCG tablet Take 1 tablet (1,000 mcg total) by mouth daily. 30 tablet 0  . ceFAZolin (ANCEF) 10 g injection     . oxyCODONE (ROXICODONE) 5 MG immediate release tablet Take 1 tablet (5 mg total) by mouth every 4 (four) hours as needed for severe pain. 30 tablet 0  . oxyCODONE-acetaminophen (PERCOCET) 7.5-325 MG tablet Take 1 tablet by mouth 2 (two) times daily as needed for up to 1 dose. 60 tablet 0  . rOPINIRole (REQUIP) 0.5 MG tablet TAKE 1 AND 1/2 TABLETS(0.75 MG) BY MOUTH AT BEDTIME 140 tablet 1   No facility-administered medications prior to visit.    PAST MEDICAL HISTORY: Past Medical History:  Diagnosis Date  . AIN (anal intraepithelial neoplasia) anal canal   . Anemia    with pregnancy  . Anxiety   . Arthritis    knees, lower back, knees  . Chronic constipation   . Chronic diastolic CHF (congestive heart failure) ([place])  07/05/2019  . CKD (chronic kidney disease)   . Coarse tremors    Essential  . COPD (chronic obstructive pulmonary disease) ([place])    2017 chest CT, history of  . Degenerative lumbar spinal stenosis   . Depression   . Depression 07/05/2019  . Drug dependency ([place])   . Elevated PTHrP level 05/09/2019  . ETOH abuse 07/05/2019  . GERD (gastroesophageal reflux disease)    pt denies  . Gout   . Grade I diastolic dysfunction 16/01/9603   Noted ECHO  . Hemorrhoids   . History of adenomatous polyp of colon   . History of basal cell carcinoma (BCC) excision 2015   nose  . History of sepsis 05/14/2014   post lumbar surgery  . History of shingles    x2  . Hyperlipidemia   . Hypertension   . Hypothyroidism   . Irregular heart beat    history of while undergoing radiation  . Malignant neoplasm of upper-inner quadrant of left breast in female, estrogen receptor positive [facility]) oncologist-  dr Jana Hakim--  per lov in epic no recurrence   dx 07-13-2015  Left breast invasive ductal carcinoma, Stage IA, Grade 1 (TXN0),  09-16-2015  s/p  left breast lumpectomy with sln bx's,  completed radiation 11-18-2015,  started antiestrogen therapy 12-14-2015  . Neuropathy    Left foot and back of left leg  . Obesity (BMI 30-39.9) 07/05/2019  . Personal history of radiation therapy    completed 11-18-2015  left breast  . Pneumonia   . PONV (postoperative nausea and vomiting)   . Prediabetes    diet controlled, no med  . Restless leg syndrome   . Seasonal allergies   . Seizure ([place]) 05/2015   due to sepsis, only one time episode  . Tremor   . Wears partial dentures    lower     PAST SURGICAL HISTORY: Past Surgical History:  Procedure Laterality Date  . ABDOMINAL HYSTERECTOMY  1985  . BASAL CELL CARCINOMA EXCISION  10/15   nose  . BREAST BIOPSY Right 08/24/2015  . BREAST BIOPSY Right 07/13/2015  . BREAST BIOPSY Left 07/13/2015  . BREAST EXCISIONAL BIOPSY Left 1972  . BREAST LUMPECTOMY Left   .  BREAST LUMPECTOMY WITH RADIOACTIVE SEED AND SENTINEL LYMPH NODE BIOPSY Left 09/16/2015   Procedure: BREAST LUMPECTOMY WITH RADIOACTIVE SEED AND SENTINEL LYMPH NODE BIOPSY;  Surgeon: Stark Klein, MD;  Location: [facility];  Service: General;  Laterality: Left;  . BREAST REDUCTION SURGERY Bilateral 1998  . CATARACT EXTRACTION W/ INTRAOCULAR LENS IMPLANT Left 2016  . [place]   hallo  . COLONOSCOPY  01/2013   polyps  . EYE SURGERY Left 2016  . HAND SURGERY Left 02-19-2002   dr Fredna Dow  @[facility]    repair collateral ligament/ MPJ left thumb  . HARDWARE REMOVAL N/A 07/24/2019   Procedure: REVISION OF HARDWARE Lumbar two - Sacral one. Extention of Fusion to Lumbar one;  Surgeon: [name], MD;  Location: [facility];  Service: Neurosurgery;  Laterality: N/A;  . HEMORRHOID SURGERY  2008  . HIGH RESOLUTION ANOSCOPY N/A 02/28/2018   Procedure: HIGH RESOLUTION ANOSCOPY WITH BIOPSY;  Surgeon: Leighton Ruff, MD;  Location[facility];  Service: General;  Laterality: N/A;  . KNEE ARTHROSCOPY Right 2002  . LUMBAR SPINE SURGERY  03-20-2015   dr Kathyrn Sheriff   fusion L4-5, L5-S1  . MOUTH SURGERY  07/14/2018   2 infected teeth removed  . MULTIPLE TOOTH EXTRACTIONS     with bone grafting lower bottom right and left  . REDUCTION MAMMAPLASTY Bilateral   . RIGHT COLECTOMY  09-10-2003   dr Margot Chimes @[facility]    multiple colon polyps (per path tubular adenoma's, hyperplastic , benign appendix, benign two lymph nodes  . ROTATOR CUFF REPAIR Left 04/2016  . TONSILLECTOMY  1969  . TUBAL LIGATION Bilateral 1982    FAMILY HISTORY: Family History  Problem Relation Age of Onset  . Atrial fibrillation Mother   . Ulcers Father   . Breast cancer Maternal Aunt   . Lung cancer Maternal Uncle   . Stomach cancer Maternal Grandmother   . Lung cancer Maternal Aunt   . Brain cancer Maternal Aunt   . Prostate cancer Maternal Grandfather   . Stroke Paternal Grandmother   .  Tuberculosis Paternal Grandfather   . Colon cancer Neg Hx   . Esophageal cancer Neg Hx   . Rectal cancer Neg Hx     SOCIAL HISTORY: Social History   Socioeconomic History  . Marital status: Married    Spouse name: robert  . Number of children: 3  . Years of education: college  . Highest education level: Not on file  Occupational History  . Occupation: retired  Tobacco Use  . Smoking status: Former Smoker    Packs/day: 0.50    Years: 35.00    Pack years: 17.50    Types: Cigarettes    Quit date: 05/22/2018    Years since quitting: 1.6  . Smokeless tobacco: Never Used  Vaping Use  . Vaping Use: Never used  Substance and Sexual Activity  . Alcohol use: Yes    Comment: 1 glass of wine a night  . Drug use: Not Currently    Comment: CBD tincture  . Sexual activity: Yes    Birth control/protection: Post-menopausal  Other Topics Concern  . Not on file  Social History Narrative   Lives with husband in a 2 story home.  Has 2 living children.  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  Education: college.    Right-handed.   Five cups coffee per week.   Social Determinants of Health   Financial Resource Strain:   . Difficulty of Paying Living Expenses: Not on file  Food Insecurity:   . Worried About Charity fundraiser in the Last Year: Not on file  . Ran Out of Food in the Last Year: Not on file  Transportation Needs:   .  Lack of Transportation (Medical): Not on file  . Lack of Transportation (Non-Medical): Not on file  Physical Activity:   . Days of Exercise per Week: Not on file  . Minutes of Exercise per Session: Not on file  Stress:   . Feeling of Stress : Not on file  Social Connections:   . Frequency of Communication with Friends and Family: Not on file  . Frequency of Social Gatherings with Friends and Family: Not on file  . Attends Religious Services: Not on file  . Active Member of Clubs or Organizations: Not on file  . Attends Archivist  Meetings: Not on file  . Marital Status: Not on file  Intimate Partner Violence:   . Fear of Current or Ex-Partner: Not on file  . Emotionally Abused: Not on file  . Physically Abused: Not on file  . Sexually Abused: Not on file   PHYSICAL EXAM  Vitals:   12/30/19 1439  BP: 140/82  Pulse: 75  Weight: 251 lb 6.4 oz (114 kg)  Height: 5\' 7"  (1.702 m)   Body mass index is 39.37 kg/m.  Generalized: Well developed, in no acute distress   Neurological examination  Mentation: Alert oriented to time, place, history taking. Follows all commands speech and language fluent Cranial nerve II-XII: Pupils were equal round reactive to light. Extraocular movements were full, visual field were full on confrontational test. Facial sensation and strength were normal.  Head turning and shoulder shrug  were normal and symmetric. Motor: Overall, no significant weakness noted, 4/5 left hip flexion Sensory: Sensory testing is intact to soft touch on all 4 extremities. No evidence of extinction is noted.  Coordination: Cerebellar testing reveals good finger-nose-finger and heel-to-shin bilaterally.  Gait and station: Gait is wide-based, cautious, uses walker Reflexes: Deep tendon reflexes are symmetric and normal bilaterally.   DIAGNOSTIC DATA (LABS, IMAGING, TESTING) - I reviewed patient records, labs, notes, testing and imaging myself where available.  Lab Results  Component Value Date   WBC 8.6 09/06/2019   HGB 12.5 09/06/2019   HCT 40.3 09/06/2019   MCV 90 09/06/2019   PLT 398 09/06/2019      Component Value Date/Time   NA 140 09/06/2019 1342   NA 142 06/28/2016 1458   K 4.8 09/06/2019 1342   K 5.2 (H) 06/28/2016 1458   CL 101 09/06/2019 1342   CO2 23 09/06/2019 1342   CO2 24 06/28/2016 1458   GLUCOSE 162 (H) 09/06/2019 1342   GLUCOSE 124 (H) 08/05/2019 0422   GLUCOSE 114 06/28/2016 1458   BUN 14 09/06/2019 1342   BUN 23.6 06/28/2016 1458   CREATININE 0.76 09/06/2019 1342    CREATININE 0.97 07/08/2016 1042   CREATININE 1.0 06/28/2016 1458   CALCIUM 10.2 09/06/2019 1342   CALCIUM 10.3 06/28/2016 1458   PROT 6.6 09/06/2019 1342   PROT 6.8 06/28/2016 1458   ALBUMIN 4.3 09/06/2019 1342   ALBUMIN 3.6 06/28/2016 1458   AST 15 09/06/2019 1342   AST 18 06/28/2016 1458   ALT 7 09/06/2019 1342   ALT 22 06/28/2016 1458   ALKPHOS 114 09/06/2019 1342   ALKPHOS 101 06/28/2016 1458   BILITOT <0.2 09/06/2019 1342   BILITOT 0.48 06/28/2016 1458   GFRNONAA 78 09/06/2019 1342   GFRAA 90 09/06/2019 1342   Lab Results  Component Value Date   CHOL 201 (H) 12/20/2018   HDL 58 12/20/2018   LDLCALC 90 12/20/2018   TRIG 325 (H) 12/20/2018   CHOLHDL 3.5 12/20/2018  Lab Results  Component Value Date   HGBA1C 6.0 (H) 12/20/2018   Lab Results  Component Value Date   VITAMINB12 1,594 (H) 07/04/2019   Lab Results  Component Value Date   TSH 1.770 09/06/2019      ASSESSMENT AND PLAN 74 y.o. year old female  has a past medical history of AIN (anal intraepithelial neoplasia) anal canal, Anemia, Anxiety, Arthritis, Chronic constipation, Chronic diastolic CHF (congestive heart failure) ([place]) (07/05/2019), CKD (chronic kidney disease), Coarse tremors, COPD (chronic obstructive pulmonary disease) ([place]), Degenerative lumbar spinal stenosis, Depression, Depression (07/05/2019), Drug dependency ([place]), Elevated PTHrP level (05/09/2019), ETOH abuse (07/05/2019), GERD (gastroesophageal reflux disease), Gout, Grade I diastolic dysfunction (01/08/3006), Hemorrhoids, History of adenomatous polyp of colon, History of basal cell carcinoma (BCC) excision (2015), History of sepsis (05/14/2014), History of shingles, Hyperlipidemia, Hypertension, Hypothyroidism, Irregular heart beat, Malignant neoplasm of upper-inner quadrant of left breast in female, estrogen receptor positive ([place]) (oncologist-  dr Jana Hakim--  per lov in epic no recurrence), Neuropathy, Obesity (BMI 30-39.9) (07/05/2019), Personal  history of radiation therapy, Pneumonia, PONV (postoperative nausea and vomiting), Prediabetes, Restless leg syndrome, Seasonal allergies, Seizure ([place]) (05/2015), Tremor, and Wears partial dentures. here with:  1.  Bilateral upper and lower extremity paresthesia  -Status post L2 laminectomy in February 2021, readmitted in March, found strep infection at L2-3, had revision of fusion July 24, 2019, was admitted to inpatient rehab,  -Paresthesia to upper and lower extremities significantly improved with Cymbalta, amitriptyline  -Continue follow-up with rehab physician, reviewed notes, considering Sprint PNS system, has continued back pain, down the left leg.  -Continues in PT  -Continue follow-up with specialists, follow-up at our office on an as-needed basis, we are currently not prescribing any medications  I spent 30 minutes of face-to-face and non-face-to-face time with patient.  This included previsit chart review, lab review, study review, order entry, electronic health record documentation, patient education.  Butler Denmark, AGNP-C, DNP 12/30/2019, 4:46 PM [facility] [address] [place], [place] [address] [phone]

## 2019-12-30 NOTE — Patient Instructions (Signed)
Continue follow-up with specialists  Continue current medications Follow-up as needed

## 2019-12-31 ENCOUNTER — Inpatient Hospital Stay: Payer: PPO | Attending: Oncology | Admitting: Oncology

## 2019-12-31 ENCOUNTER — Telehealth: Payer: Self-pay | Admitting: Oncology

## 2019-12-31 ENCOUNTER — Other Ambulatory Visit: Payer: Self-pay

## 2019-12-31 ENCOUNTER — Inpatient Hospital Stay: Payer: PPO

## 2019-12-31 VITALS — BP 161/87 | HR 110 | Temp 97.7°F | Resp 18 | Ht 67.0 in | Wt 250.4 lb

## 2019-12-31 DIAGNOSIS — Z800 Family history of malignant neoplasm of digestive organs: Secondary | ICD-10-CM | POA: Diagnosis not present

## 2019-12-31 DIAGNOSIS — Z79899 Other long term (current) drug therapy: Secondary | ICD-10-CM | POA: Diagnosis not present

## 2019-12-31 DIAGNOSIS — Z170 Estrogen receptor positive status [ER+]: Secondary | ICD-10-CM | POA: Diagnosis not present

## 2019-12-31 DIAGNOSIS — M8580 Other specified disorders of bone density and structure, unspecified site: Secondary | ICD-10-CM | POA: Insufficient documentation

## 2019-12-31 DIAGNOSIS — Z87891 Personal history of nicotine dependence: Secondary | ICD-10-CM | POA: Diagnosis not present

## 2019-12-31 DIAGNOSIS — N6310 Unspecified lump in the right breast, unspecified quadrant: Secondary | ICD-10-CM

## 2019-12-31 DIAGNOSIS — C50212 Malignant neoplasm of upper-inner quadrant of left female breast: Secondary | ICD-10-CM | POA: Diagnosis not present

## 2019-12-31 DIAGNOSIS — Z923 Personal history of irradiation: Secondary | ICD-10-CM | POA: Insufficient documentation

## 2019-12-31 DIAGNOSIS — Z803 Family history of malignant neoplasm of breast: Secondary | ICD-10-CM | POA: Insufficient documentation

## 2019-12-31 DIAGNOSIS — Z801 Family history of malignant neoplasm of trachea, bronchus and lung: Secondary | ICD-10-CM | POA: Insufficient documentation

## 2019-12-31 DIAGNOSIS — Z8042 Family history of malignant neoplasm of prostate: Secondary | ICD-10-CM | POA: Insufficient documentation

## 2019-12-31 DIAGNOSIS — Z79811 Long term (current) use of aromatase inhibitors: Secondary | ICD-10-CM | POA: Insufficient documentation

## 2019-12-31 LAB — CBC WITH DIFFERENTIAL/PLATELET
Abs Immature Granulocytes: 0.19 10*3/uL — ABNORMAL HIGH (ref 0.00–0.07)
Basophils Absolute: 0.1 10*3/uL (ref 0.0–0.1)
Basophils Relative: 1 %
Eosinophils Absolute: 0.3 10*3/uL (ref 0.0–0.5)
Eosinophils Relative: 4 %
HCT: 39.8 % (ref 36.0–46.0)
Hemoglobin: 12.7 g/dL (ref 12.0–15.0)
Immature Granulocytes: 2 %
Lymphocytes Relative: 20 %
Lymphs Abs: 1.7 10*3/uL (ref 0.7–4.0)
MCH: 30 pg (ref 26.0–34.0)
MCHC: 31.9 g/dL (ref 30.0–36.0)
MCV: 93.9 fL (ref 80.0–100.0)
Monocytes Absolute: 0.8 10*3/uL (ref 0.1–1.0)
Monocytes Relative: 9 %
Neutro Abs: 5.5 10*3/uL (ref 1.7–7.7)
Neutrophils Relative %: 64 %
Platelets: 237 10*3/uL (ref 150–400)
RBC: 4.24 MIL/uL (ref 3.87–5.11)
RDW: 15.3 % (ref 11.5–15.5)
WBC: 8.6 10*3/uL (ref 4.0–10.5)
nRBC: 0 % (ref 0.0–0.2)

## 2019-12-31 NOTE — Telephone Encounter (Signed)
Scheduled appt per 9/14 los. Pt declined print out of AVS.

## 2020-01-01 ENCOUNTER — Other Ambulatory Visit: Payer: Self-pay

## 2020-01-01 ENCOUNTER — Ambulatory Visit: Payer: PPO | Admitting: Physical Therapy

## 2020-01-01 ENCOUNTER — Encounter: Payer: Self-pay | Admitting: Physical Therapy

## 2020-01-01 DIAGNOSIS — M545 Low back pain: Secondary | ICD-10-CM | POA: Diagnosis not present

## 2020-01-01 DIAGNOSIS — G8929 Other chronic pain: Secondary | ICD-10-CM

## 2020-01-01 DIAGNOSIS — M6281 Muscle weakness (generalized): Secondary | ICD-10-CM

## 2020-01-01 DIAGNOSIS — M5416 Radiculopathy, lumbar region: Secondary | ICD-10-CM

## 2020-01-01 DIAGNOSIS — R2689 Other abnormalities of gait and mobility: Secondary | ICD-10-CM

## 2020-01-01 NOTE — Therapy (Signed)
[place], [place], [address] Phone: [phone]   Fax:  [phone]  Physical Therapy Treatment  Patient Details  Name: JENAN ELLEGOOD MRN: [id number] Date of Birth: 1945-10-29 Referring Provider (PT): Leeroy Cha, MD   Encounter Date: 01/01/2020   PT End of Session - 01/01/20 1244    Visit Number 9    Number of Visits 18    Date for PT Re-Evaluation 01/30/20    Authorization Type [place] [employer], next progress note by visit 17    PT Start Time 1233    PT Stop Time 1313    PT Time Calculation (min) 40 min           Past Medical History:  Diagnosis Date  . AIN (anal intraepithelial neoplasia) anal canal   . Anemia    with pregnancy  . Anxiety   . Arthritis    knees, lower back, knees  . Chronic constipation   . Chronic diastolic CHF (congestive heart failure) ([place]) 07/05/2019  . CKD (chronic kidney disease)   . Coarse tremors    Essential  . COPD (chronic obstructive pulmonary disease) ([place])    2017 chest CT, history of  . Degenerative lumbar spinal stenosis   . Depression   . Depression 07/05/2019  . Drug dependency ([place])   . Elevated PTHrP level 05/09/2019  . ETOH abuse 07/05/2019  . GERD (gastroesophageal reflux disease)    pt denies  . Gout   . Grade I diastolic dysfunction 60/73/7106   Noted ECHO  . Hemorrhoids   . History of adenomatous polyp of colon   . History of basal cell carcinoma (BCC) excision 2015   nose  . History of sepsis 05/14/2014   post lumbar surgery  . History of shingles    x2  . Hyperlipidemia   . Hypertension   . Hypothyroidism   . Irregular heart beat    history of while undergoing radiation  . Malignant neoplasm of upper-inner quadrant of left breast in female, estrogen receptor positive [facility]) oncologist-  dr Jana Hakim--  per lov in epic no recurrence   dx 07-13-2015  Left breast invasive ductal carcinoma, Stage IA, Grade 1 (TXN0),  09-16-2015  s/p   left breast lumpectomy with sln bx's,  completed radiation 11-18-2015,  started antiestrogen therapy 12-14-2015  . Neuropathy    Left foot and back of left leg  . Obesity (BMI 30-39.9) 07/05/2019  . Personal history of radiation therapy    completed 11-18-2015  left breast  . Pneumonia   . PONV (postoperative nausea and vomiting)   . Prediabetes    diet controlled, no med  . Restless leg syndrome   . Seasonal allergies   . Seizure ([place]) 05/2015   due to sepsis, only one time episode  . Tremor   . Wears partial dentures    lower     Past Surgical History:  Procedure Laterality Date  . ABDOMINAL HYSTERECTOMY  1985  . BASAL CELL CARCINOMA EXCISION  10/15   nose  . BREAST BIOPSY Right 08/24/2015  . BREAST BIOPSY Right 07/13/2015  . BREAST BIOPSY Left 07/13/2015  . BREAST EXCISIONAL BIOPSY Left 1972  . BREAST LUMPECTOMY Left   . BREAST LUMPECTOMY WITH RADIOACTIVE SEED AND SENTINEL LYMPH NODE BIOPSY Left 09/16/2015   Procedure: BREAST LUMPECTOMY WITH RADIOACTIVE SEED AND SENTINEL LYMPH NODE BIOPSY;  Surgeon: Stark Klein, MD;  Location: [facility];  Service: General;  Laterality: Left;  . BREAST  REDUCTION SURGERY Bilateral 1998  . CATARACT EXTRACTION W/ INTRAOCULAR LENS IMPLANT Left 2016  . [place]   hallo  . COLONOSCOPY  01/2013   polyps  . EYE SURGERY Left 2016  . HAND SURGERY Left 02-19-2002   dr Fredna Dow  '@[facility]'    repair collateral ligament/ MPJ left thumb  . HARDWARE REMOVAL N/A 07/24/2019   Procedure: REVISION OF HARDWARE Lumbar two - Sacral one. Extention of Fusion to Lumbar one;  Surgeon: [name], MD;  Location: [facility];  Service: Neurosurgery;  Laterality: N/A;  . HEMORRHOID SURGERY  2008  . HIGH RESOLUTION ANOSCOPY N/A 02/28/2018   Procedure: HIGH RESOLUTION ANOSCOPY WITH BIOPSY;  Surgeon: Leighton Ruff, MD;  Location: [facility];  Service: General;  Laterality: N/A;  . KNEE ARTHROSCOPY Right 2002  . LUMBAR SPINE  SURGERY  03-20-2015   dr Kathyrn Sheriff   fusion L4-5, L5-S1  . MOUTH SURGERY  07/14/2018   2 infected teeth removed  . MULTIPLE TOOTH EXTRACTIONS     with bone grafting lower bottom right and left  . REDUCTION MAMMAPLASTY Bilateral   . RIGHT COLECTOMY  09-10-2003   dr Margot Chimes '@[email or web address]'    multiple colon polyps (per path tubular adenoma's, hyperplastic , benign appendix, benign two lymph nodes  . ROTATOR CUFF REPAIR Left 04/2016  . TONSILLECTOMY  1969  . TUBAL LIGATION Bilateral 1982    There were no vitals filed for this visit.   Subjective Assessment - 01/01/20 1243    Subjective Pt reports feeling better since falling last week. Has been to MD this week.    Currently in Pain? Yes    Pain Score 4     Pain Location Back    Pain Orientation Lower    Pain Descriptors / Indicators Aching;Spasm    Pain Type Chronic pain    Aggravating Factors  standing, prolonged positions, sitting    Pain Relieving Factors rest, ice , meds                             [facility] Adult PT Treatment/Exercise - 01/01/20 0001      Ambulation/Gait   Gait Comments 372 ft with RW       Neuro Re-ed    Neuro Re-ed Details  narrow stance static balance       Lumbar Exercises: Aerobic   Nustep L2 UE/LE x 10 minutes       Lumbar Exercises: Standing   Heel Raises 20 reps    Row AROM;Strengthening;20 reps    Theraband Level (Row) Level 3 (Green)    Shoulder Extension AROM;Strengthening;Both;20 reps    Theraband Level (Shoulder Extension) Level 3 (Green)    Other Standing Lumbar Exercises standing hip abduction x 10 each at RW , standing march at [employer]     Other Standing Lumbar Exercises Standing gluteal sets x 10 , ab sets pressing PB on table x 10 -cues for breathing                     PT Short Term Goals - 12/19/19 1432      PT SHORT TERM GOAL #1   Title Independent with HEP    Baseline met for initial HEP, will update prn    Time 3    Period Weeks    Status Achieved      PT  SHORT TERM GOAL #2   Title Improve TUG time (with RW)  at least 3-5 sec for decreased fall risk    Baseline 13 sec-improved from baseline but goal still ongoing    Time 3    Period Weeks    Status On-going    Target Date 01/09/20      PT SHORT TERM GOAL #3   Title Ambulate at least 100 feet CGA with SPC for progression to LTG for gait    Baseline met/able    Time 3    Period Weeks    Status Achieved             PT Long Term Goals - 12/19/19 1434      PT LONG TERM GOAL #1   Title Improve [place] Gait + Balance at least 3-5 points for decreased fall risk    Baseline 19/28    Time 6    Period Weeks    Status On-going    Target Date 01/30/20      PT LONG TERM GOAL #2   Title Increase left hip and ankle strength at least grossly 1/2 MMT grade to improve ability to ascend/descend stairs to 2nd floor bedroom    Baseline see objective-partially met    Time 6    Period Weeks    Status Partially Met    Target Date 01/30/20      PT LONG TERM GOAL #3   Title Perform household level ambulation 150 feet or less mod I with SPC vs. LRAD    Baseline progressing in therapy with gait with SPC but continues with RW use    Time 6    Period Weeks    Status On-going    Target Date 01/30/20      PT LONG TERM GOAL #4   Title Improve 5 times sit<>stand time to 14 sce or less for improved ability for transfers from low seats    Baseline met-11.7 seconds 12/19/19    Time 6    Period Weeks    Status Achieved      PT LONG TERM GOAL #5   Title Tolerate standing for periods at least 15-20 min for cooking and light chores with LBP 4/10 or less    Baseline still ongoing with higher pain level    Time 6    Period Weeks    Status On-going    Target Date 01/30/20                 Plan - 01/01/20 1248    Clinical Impression Statement Pt arrives reporting that she feels better after last week when she feel at EOB. Her pain is 4/10 today. SPO2 remained above 90% on Nustep for 10 minutes.  Gait in clinc with RW , she ambulated 344f prior to fatigue however o2 sats dropped to 81% and 117 bpm HR. She has seen several MDs this week who have not recommended any supplemental oxygen. Encouarged her to continue her HEP at home as able. Began standing abdominal activation and repeated scapular bands. Overall session tolerated well with frequent rest breaks.    PT Next Visit Plan monitor vitals/O2 sats, continue gait with SPC,  standing exercise/functional activity progression, continue core + LE strengthening,    PT Home Exercise Plan Access Code: [email or web address]: [email or web address]: 08/31/2021Prepared by: JCaesar BookmanSquat with Counter Support - 2 x daily - 7 x weekly - 2 sets - 10 repsStanding March with Counter Support - 2 x daily - 7 x weekly - 3 sets - 10 repsStanding Heel Raise with Support -  2 x daily - 7 x weekly - 3 sets - 10 repsStanding Hip Abduction with Counter Support - 2 x daily - 7 x weekly - 3 sets - 10 reps           Patient will benefit from skilled therapeutic intervention in order to improve the following deficits and impairments:  Abnormal gait, Decreased balance, Decreased endurance, Difficulty walking, Impaired sensation, Pain, Impaired flexibility, Decreased strength, Decreased activity tolerance  Visit Diagnosis: Chronic bilateral low back pain, unspecified whether sciatica present  Muscle weakness (generalized)  Other abnormalities of gait and mobility  Lumbar radiculopathy     Problem List Patient Active Problem List   Diagnosis Date Noted  . Insomnia 10/29/2019  . Recurrent falls 09/06/2019  . Confusion, postoperative   . Benign essential HTN   . Post-operative pain   . Chronic constipation   . 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 Post op infection 07/11/2019  . Muscle spasm   . Hypokalemia   . Status post lumbar spinal fusion 07/10/2019  . Postoperative pain   . Acute blood loss anemia   . Labile blood  pressure   . Tobacco abuse   . Hypoalbuminemia due to protein-calorie malnutrition ([place])   . Anxiety 07/05/2019  . Depression 07/05/2019  . Chronic diastolic CHF (congestive heart failure) ([place]) 07/05/2019  . Obesity (BMI 30-39.9) 07/05/2019  . ETOH abuse 07/05/2019  . Lumbar radiculopathy 06/28/2019  . Spinal stenosis of lumbar region 06/28/2019  . Acute on chronic anemia   . Tremors of nervous system   . AKI (acute kidney injury) ([place])   . Chronic pain syndrome   . Acute renal failure superimposed on stage 3 chronic kidney disease ([place]) 06/25/2019  . Acute osteomyelitis of lumbar spine ([place]) 06/25/2019  . Weakness of left lower extremity   . Abdominal pain 06/24/2019  . Nausea 06/24/2019  . Abnormal urine odor 06/24/2019  . History of sepsis 06/24/2019  . Recent major surgery 06/24/2019  . History of diverticulosis 06/24/2019  . Elevated PTHrP level 05/09/2019  . Paresthesia 02/12/2019  . Low back pain 02/12/2019  . Neuropathy 12/20/2018  . Lumbar spinal stenosis 06/29/2018  . Preoperative clearance 06/04/2018  . Abnormal CT of the chest 06/04/2018  . Smoker 05/17/2018  . Hypothyroidism 11/30/2017  . Thoracic aortic atherosclerosis ([place]) 09/14/2016  . Benign essential tremor 09/09/2016  . RLS (restless legs syndrome) 09/09/2016  . H/O adenomatous polyp of colon 07/06/2016  . PVC's (premature ventricular contractions) 03/29/2016  . Preoperative cardiovascular examination 03/29/2016  . Grade I diastolic dysfunction 40/97/3532  . Palpitations 01/11/2016  . Shortness of breath 01/11/2016  . Former cigarette smoker 01/11/2016  . Leg edema 01/11/2016  . Chronic cough 10/28/2015  . 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 Malignant neoplasm of upper-inner quadrant of left breast in female, estrogen receptor positive ([place]) 07/14/2015  . Prediabetes 07/08/2015  . CKD (chronic kidney disease), stage III 07/08/2015  . Dependent edema 06/08/2015  . Sleep disturbance 06/08/2015   . Mixed hyperlipidemia 06/08/2015  . Generalized anxiety disorder 06/08/2015  . Essential hypertension 06/08/2015  . COPD (chronic obstructive pulmonary disease) ([place]) 06/08/2015  . Gout 06/08/2015  . Macrocytic anemia   . 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 Sepsis ([place]) 05/15/2015  . Lumbar spondylosis 03/20/2015    Dorene Ar, PTA 01/01/2020, 1:27 PM  [facility] [address] [place], [place], [address] Phone: [phone]   Fax:  [phone]  Name: GRIZELDA PISCOPO MRN: [id number]  Date of Birth: [birth year]

## 2020-01-03 ENCOUNTER — Ambulatory Visit: Payer: PPO | Admitting: Physical Therapy

## 2020-01-03 ENCOUNTER — Other Ambulatory Visit: Payer: Self-pay

## 2020-01-03 ENCOUNTER — Encounter: Payer: Self-pay | Admitting: Physical Therapy

## 2020-01-03 VITALS — BP 160/94 | HR 104

## 2020-01-03 DIAGNOSIS — R2689 Other abnormalities of gait and mobility: Secondary | ICD-10-CM

## 2020-01-03 DIAGNOSIS — M6281 Muscle weakness (generalized): Secondary | ICD-10-CM

## 2020-01-03 DIAGNOSIS — M545 Low back pain, unspecified: Secondary | ICD-10-CM

## 2020-01-03 NOTE — Therapy (Signed)
[place], [place], [address] Phone: [phone]   Fax:  [phone]  Physical Therapy Treatment  Patient Details  Name: Brandi Stephens MRN: [id number] Date of Birth: [birth year] Referring Provider (PT): Leeroy Cha, MD   Encounter Date: 01/03/2020   PT End of Session - 01/03/20 1408    Visit Number 10    Number of Visits 18    Date for PT Re-Evaluation 01/30/20    Authorization Type [employer] Medicare, next progress note by visit 17    PT Start Time 1318    PT Stop Time 1402    PT Time Calculation (min) 44 min    Equipment Utilized During Treatment Gait belt   SPC   Activity Tolerance Patient limited by fatigue;Patient limited by pain    Behavior During Therapy [facility] for tasks assessed/performed           Past Medical History:  Diagnosis Date  . AIN (anal intraepithelial neoplasia) anal canal   . Anemia    with pregnancy  . Anxiety   . Arthritis    knees, lower back, knees  . Chronic constipation   . Chronic diastolic CHF (congestive heart failure) ([place]) 07/05/2019  . CKD (chronic kidney disease)   . Coarse tremors    Essential  . COPD (chronic obstructive pulmonary disease) ([place])    2017 chest CT, history of  . Degenerative lumbar spinal stenosis   . Depression   . Depression 07/05/2019  . Drug dependency ([place])   . Elevated PTHrP level 05/09/2019  . ETOH abuse 07/05/2019  . GERD (gastroesophageal reflux disease)    pt denies  . Gout   . Grade I diastolic dysfunction 41/66/0630   Noted ECHO  . Hemorrhoids   . History of adenomatous polyp of colon   . History of basal cell carcinoma (BCC) excision 2015   nose  . History of sepsis 05/14/2014   post lumbar surgery  . History of shingles    x2  . Hyperlipidemia   . Hypertension   . Hypothyroidism   . Irregular heart beat    history of while undergoing radiation  . Malignant neoplasm of upper-inner quadrant of left breast in female,  estrogen receptor positive [facility]) oncologist-  dr Jana Hakim--  per lov in epic no recurrence   dx 07-13-2015  Left breast invasive ductal carcinoma, Stage IA, Grade 1 (TXN0),  09-16-2015  s/p  left breast lumpectomy with sln bx's,  completed radiation 11-18-2015,  started antiestrogen therapy 12-14-2015  . Neuropathy    Left foot and back of left leg  . Obesity (BMI 30-39.9) 07/05/2019  . Personal history of radiation therapy    completed 11-18-2015  left breast  . Pneumonia   . PONV (postoperative nausea and vomiting)   . Prediabetes    diet controlled, no med  . Restless leg syndrome   . Seasonal allergies   . Seizure ([place]) 05/2015   due to sepsis, only one time episode  . Tremor   . Wears partial dentures    lower     Past Surgical History:  Procedure Laterality Date  . ABDOMINAL HYSTERECTOMY  1985  . BASAL CELL CARCINOMA EXCISION  10/15   nose  . BREAST BIOPSY Right 08/24/2015  . BREAST BIOPSY Right 07/13/2015  . BREAST BIOPSY Left 07/13/2015  . BREAST EXCISIONAL BIOPSY Left 1972  . BREAST LUMPECTOMY Left   . BREAST LUMPECTOMY WITH RADIOACTIVE SEED AND SENTINEL LYMPH NODE BIOPSY Left 09/16/2015  Procedure: BREAST LUMPECTOMY WITH RADIOACTIVE SEED AND SENTINEL LYMPH NODE BIOPSY;  Surgeon: Stark Klein, MD;  Location: [place];  Service: General;  Laterality: Left;  . BREAST REDUCTION SURGERY Bilateral 1998  . CATARACT EXTRACTION W/ INTRAOCULAR LENS IMPLANT Left 2016  . [place]   hallo  . COLONOSCOPY  01/2013   polyps  . EYE SURGERY Left 2016  . HAND SURGERY Left 02-19-2002   dr Fredna Dow  '@[email or web address]'    repair collateral ligament/ MPJ left thumb  . HARDWARE REMOVAL N/A 07/24/2019   Procedure: REVISION OF HARDWARE Lumbar two - Sacral one. Extention of Fusion to Lumbar one;  Surgeon: [name], MD;  Location: [place];  Service: Neurosurgery;  Laterality: N/A;  . HEMORRHOID SURGERY  2008  . HIGH RESOLUTION ANOSCOPY N/A 02/28/2018   Procedure:  HIGH RESOLUTION ANOSCOPY WITH BIOPSY;  Surgeon: Leighton Ruff, MD;  Location: [facility];  Service: General;  Laterality: N/A;  . KNEE ARTHROSCOPY Right 2002  . LUMBAR SPINE SURGERY  03-20-2015   dr Kathyrn Sheriff   fusion L4-5, L5-S1  . MOUTH SURGERY  07/14/2018   2 infected teeth removed  . MULTIPLE TOOTH EXTRACTIONS     with bone grafting lower bottom right and left  . REDUCTION MAMMAPLASTY Bilateral   . RIGHT COLECTOMY  09-10-2003   dr Margot Chimes '@[email or web address]'    multiple colon polyps (per path tubular adenoma's, hyperplastic , benign appendix, benign two lymph nodes  . ROTATOR CUFF REPAIR Left 04/2016  . TONSILLECTOMY  1969  . TUBAL LIGATION Bilateral 1982    Vitals:   01/03/20 1321  BP: (!) 160/94  Pulse: (!) 104  SpO2: 91%     Subjective Assessment - 01/03/20 1328    Subjective Pt. reports back pain flared up today which she attributes to prolonged sitting up while having company yesterday.    Pertinent History L4-S1 fusion 03/20/15, L2-3 fusion 05/30/19, breast CA history 2017, seizures (due to sepsis),  lumbar hardware removal 07/24/19 with subsequent staph infection/osteomyelitis, CHF, COPD, chronic pain in pain management, history ETOH abuse, neuropathy, intention tremor    Currently in Pain? Yes    Pain Location Back    Pain Orientation Lower    Pain Descriptors / Indicators Sharp;Aching    Pain Type Chronic pain    Pain Onset More than a month ago    Pain Frequency Constant    Aggravating Factors  sitting up, prolonged positions, standing    Pain Relieving Factors rest, ice, meds    Effect of Pain on Daily Activities limits positional and activity tolerance              [facility] PT Assessment - 01/03/20 0001      Ambulation/Gait   Ambulation Distance (Feet) 360 Feet    Assistive device Straight cane    Gait Pattern Step-through pattern    Gait Comments 360 feet with SPC CGA-SBA with min cues for sequencing for step-through gait pattern   1 seated rest after 200  feet                        [facility] Adult PT Treatment/Exercise - 01/03/20 0001      Lumbar Exercises: Aerobic   Nustep L4 UE/LE x 10 minutes   RPE 3/10     Lumbar Exercises: Standing   Row AROM;Strengthening;20 reps    Theraband Level (Row) Level 4 (Blue)    Shoulder Extension AROM;Strengthening;Both;20 reps    Theraband  Level (Shoulder Extension) Level 3 (Green)    Other Standing Lumbar Exercises standing hip abd SLR x 15 reps ea. bilat.    Other Standing Lumbar Exercises standing ab set with P-ball press x 15 reps                  PT Education - 01/03/20 1407    Education Details monitor BP/O2 at home and MD follow up for BP if staying elevated    Person(s) Educated Patient    Methods Explanation    Comprehension Verbalized understanding            PT Short Term Goals - 12/19/19 1432      PT SHORT TERM GOAL #1   Title Independent with HEP    Baseline met for initial HEP, will update prn    Time 3    Period Weeks    Status Achieved      PT SHORT TERM GOAL #2   Title Improve TUG time (with RW) at least 3-5 sec for decreased fall risk    Baseline 13 sec-improved from baseline but goal still ongoing    Time 3    Period Weeks    Status On-going    Target Date 01/09/20      PT SHORT TERM GOAL #3   Title Ambulate at least 100 feet CGA with SPC for progression to LTG for gait    Baseline met/able    Time 3    Period Weeks    Status Achieved             PT Long Term Goals - 12/19/19 1434      PT LONG TERM GOAL #1   Title Improve [place] Gait + Balance at least 3-5 points for decreased fall risk    Baseline 19/28    Time 6    Period Weeks    Status On-going    Target Date 01/30/20      PT LONG TERM GOAL #2   Title Increase left hip and ankle strength at least grossly 1/2 MMT grade to improve ability to ascend/descend stairs to 2nd floor bedroom    Baseline see objective-partially met    Time 6    Period Weeks    Status Partially  Met    Target Date 01/30/20      PT LONG TERM GOAL #3   Title Perform household level ambulation 150 feet or less mod I with SPC vs. LRAD    Baseline progressing in therapy with gait with SPC but continues with RW use    Time 6    Period Weeks    Status On-going    Target Date 01/30/20      PT LONG TERM GOAL #4   Title Improve 5 times sit<>stand time to 14 sce or less for improved ability for transfers from low seats    Baseline met-11.7 seconds 12/19/19    Time 6    Period Weeks    Status Achieved      PT LONG TERM GOAL #5   Title Tolerate standing for periods at least 15-20 min for cooking and light chores with LBP 4/10 or less    Baseline still ongoing with higher pain level    Time 6    Period Weeks    Status On-going    Target Date 01/30/20                 Plan - 01/03/20 1409    Clinical  Impression Statement Worked on gait training with SPC again today. Pt. able to complete distance as noted per flowsheet with 1 seated rest with CGA-SBA for safety and min cues for sequencing. Though intermittent CGA needed for safety fatigue and pain continue to be primary limiting factors for activity tolerance and gait distance. Still a tendency to desature <90% with walking but recovers >90% with brief rest and cues for breathing. Some concern also for elevated BP readings-pt. reports has been monitoring at home and has not been this high. Urged to continue to monitor and alert MD as needed if numbers persistently high.    Personal Factors and Comorbidities Comorbidity 3+;Time since onset of injury/illness/exacerbation;Age    Comorbidities see PMH    Examination-Activity Limitations Sit;Dressing;Bathing;Transfers;Sleep;Hygiene/Grooming;Bed Mobility;Bend;Lift;Squat;Stairs;Locomotion Level;Carry;Stand;Continence;Toileting    Examination-Participation Restrictions Community Activity;Shop;Cleaning;Laundry;Meal Prep    Stability/Clinical Decision Making Evolving/Moderate complexity     Clinical Decision Making Moderate    Rehab Potential Good    PT Frequency 2x / week    PT Duration 6 weeks    PT Treatment/Interventions ADLs/Self Care Home Management;Cryotherapy;Moist Heat;Therapeutic activities;Gait training;Therapeutic exercise;Patient/family education;Manual techniques;Stair training;Functional mobility training;Balance training;Neuromuscular re-education;Taping    PT Next Visit Plan monitor vitals/O2 sats, continue gait with SPC,  standing exercise/functional activity progression, continue core + LE strengthening,    PT Home Exercise Plan Access Code: [email or web address]: [email or web address]: 08/31/2021Prepared by: Caesar Bookman Squat with Counter Support - 2 x daily - 7 x weekly - 2 sets - 10 repsStanding March with Counter Support - 2 x daily - 7 x weekly - 3 sets - 10 repsStanding Heel Raise with Support - 2 x daily - 7 x weekly - 3 sets - 10 repsStanding Hip Abduction with Counter Support - 2 x daily - 7 x weekly - 3 sets - 10 reps    Consulted and Agree with Plan of Care Patient           Patient will benefit from skilled therapeutic intervention in order to improve the following deficits and impairments:  Abnormal gait, Decreased balance, Decreased endurance, Difficulty walking, Impaired sensation, Pain, Impaired flexibility, Decreased strength, Decreased activity tolerance  Visit Diagnosis: Chronic bilateral low back pain, unspecified whether sciatica present  Muscle weakness (generalized)  Other abnormalities of gait and mobility     Problem List Patient Active Problem List   Diagnosis Date Noted  . Insomnia 10/29/2019  . Recurrent falls 09/06/2019  . Confusion, postoperative   . Benign essential HTN   . Post-operative pain   . Chronic constipation   . 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 Post op infection 07/11/2019  . Muscle spasm   . Hypokalemia   . Status post lumbar spinal fusion 07/10/2019  . Postoperative pain     . Acute blood loss anemia   . Labile blood pressure   . Tobacco abuse   . Hypoalbuminemia due to protein-calorie malnutrition ([place])   . Anxiety 07/05/2019  . Depression 07/05/2019  . Chronic diastolic CHF (congestive heart failure) ([place]) 07/05/2019  . Obesity (BMI 30-39.9) 07/05/2019  . ETOH abuse 07/05/2019  . Lumbar radiculopathy 06/28/2019  . Spinal stenosis of lumbar region 06/28/2019  . Acute on chronic anemia   . Tremors of nervous system   . AKI (acute kidney injury) ([place])   . Chronic pain syndrome   . Acute renal failure superimposed on stage 3 chronic kidney disease ([place]) 06/25/2019  . Acute osteomyelitis of lumbar spine ([place]) 06/25/2019  . Weakness of left lower extremity   . Abdominal pain  06/24/2019  . Nausea 06/24/2019  . Abnormal urine odor 06/24/2019  . History of sepsis 06/24/2019  . Recent major surgery 06/24/2019  . History of diverticulosis 06/24/2019  . Elevated PTHrP level 05/09/2019  . Paresthesia 02/12/2019  . Low back pain 02/12/2019  . Neuropathy 12/20/2018  . Lumbar spinal stenosis 06/29/2018  . Preoperative clearance 06/04/2018  . Abnormal CT of the chest 06/04/2018  . Smoker 05/17/2018  . Hypothyroidism 11/30/2017  . Thoracic aortic atherosclerosis ([place]) 09/14/2016  . Benign essential tremor 09/09/2016  . RLS (restless legs syndrome) 09/09/2016  . H/O adenomatous polyp of colon 07/06/2016  . PVC's (premature ventricular contractions) 03/29/2016  . Preoperative cardiovascular examination 03/29/2016  . Grade I diastolic dysfunction 97/74/1423  . Palpitations 01/11/2016  . Shortness of breath 01/11/2016  . Former cigarette smoker 01/11/2016  . Leg edema 01/11/2016  . Chronic cough 10/28/2015  . 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 Malignant neoplasm of upper-inner quadrant of left breast in female, estrogen receptor positive ([place]) 07/14/2015  . Prediabetes 07/08/2015  . CKD (chronic kidney disease), stage III 07/08/2015  . Dependent  edema 06/08/2015  . Sleep disturbance 06/08/2015  . Mixed hyperlipidemia 06/08/2015  . Generalized anxiety disorder 06/08/2015  . Essential hypertension 06/08/2015  . COPD (chronic obstructive pulmonary disease) ([place]) 06/08/2015  . Gout 06/08/2015  . Macrocytic anemia   . 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 Sepsis ([place]) 05/15/2015  . Lumbar spondylosis 03/20/2015    Beaulah Dinning, PT, DPT 01/03/20 2:14 PM  [facility] [address] [place], [place], [address] Phone: [phone]   Fax:  [phone]  Name: Brandi Stephens MRN: [id number] Date of Birth: 1945/11/24

## 2020-01-07 ENCOUNTER — Encounter: Payer: Self-pay | Admitting: Physical Therapy

## 2020-01-07 ENCOUNTER — Ambulatory Visit: Payer: PPO | Admitting: Physical Therapy

## 2020-01-07 ENCOUNTER — Other Ambulatory Visit: Payer: Self-pay

## 2020-01-07 VITALS — BP 164/83 | HR 101

## 2020-01-07 DIAGNOSIS — M6281 Muscle weakness (generalized): Secondary | ICD-10-CM

## 2020-01-07 DIAGNOSIS — M545 Low back pain, unspecified: Secondary | ICD-10-CM

## 2020-01-07 DIAGNOSIS — R2689 Other abnormalities of gait and mobility: Secondary | ICD-10-CM

## 2020-01-07 NOTE — Therapy (Signed)
[place], [place], [address] Phone: [phone]   Fax:  [phone]  Physical Therapy Treatment  Patient Details  Name: Brandi Stephens MRN: [id number] Date of Birth: [birth year] Referring Provider (PT): Leeroy Cha, MD   Encounter Date: 01/07/2020   PT End of Session - 01/07/20 1343    Visit Number 11    Number of Visits 18    Date for PT Re-Evaluation 01/30/20    Authorization Type [place] Medicare, next progress note by visit 17    PT Start Time 1330    PT Stop Time 1413    PT Time Calculation (min) 43 min    Equipment Utilized During Treatment Gait belt    Activity Tolerance Patient limited by fatigue    Behavior During Therapy [facility] for tasks assessed/performed           Past Medical History:  Diagnosis Date  . AIN (anal intraepithelial neoplasia) anal canal   . Anemia    with pregnancy  . Anxiety   . Arthritis    knees, lower back, knees  . Chronic constipation   . Chronic diastolic CHF (congestive heart failure) ([place]) 07/05/2019  . CKD (chronic kidney disease)   . Coarse tremors    Essential  . COPD (chronic obstructive pulmonary disease) ([place])    2017 chest CT, history of  . Degenerative lumbar spinal stenosis   . Depression   . Depression 07/05/2019  . Drug dependency ([place])   . Elevated PTHrP level 05/09/2019  . ETOH abuse 07/05/2019  . GERD (gastroesophageal reflux disease)    pt denies  . Gout   . Grade I diastolic dysfunction 97/98/9211   Noted ECHO  . Hemorrhoids   . History of adenomatous polyp of colon   . History of basal cell carcinoma (BCC) excision 2015   nose  . History of sepsis 05/14/2014   post lumbar surgery  . History of shingles    x2  . Hyperlipidemia   . Hypertension   . Hypothyroidism   . Irregular heart beat    history of while undergoing radiation  . Malignant neoplasm of upper-inner quadrant of left breast in female, estrogen receptor positive [facility])  oncologist-  dr Jana Hakim--  per lov in epic no recurrence   dx 07-13-2015  Left breast invasive ductal carcinoma, Stage IA, Grade 1 (TXN0),  09-16-2015  s/p  left breast lumpectomy with sln bx's,  completed radiation 11-18-2015,  started antiestrogen therapy 12-14-2015  . Neuropathy    Left foot and back of left leg  . Obesity (BMI 30-39.9) 07/05/2019  . Personal history of radiation therapy    completed 11-18-2015  left breast  . Pneumonia   . PONV (postoperative nausea and vomiting)   . Prediabetes    diet controlled, no med  . Restless leg syndrome   . Seasonal allergies   . Seizure ([place]) 05/2015   due to sepsis, only one time episode  . Tremor   . Wears partial dentures    lower     Past Surgical History:  Procedure Laterality Date  . ABDOMINAL HYSTERECTOMY  1985  . BASAL CELL CARCINOMA EXCISION  10/15   nose  . BREAST BIOPSY Right 08/24/2015  . BREAST BIOPSY Right 07/13/2015  . BREAST BIOPSY Left 07/13/2015  . BREAST EXCISIONAL BIOPSY Left 1972  . BREAST LUMPECTOMY Left   . BREAST LUMPECTOMY WITH RADIOACTIVE SEED AND SENTINEL LYMPH NODE BIOPSY Left 09/16/2015   Procedure: BREAST LUMPECTOMY  WITH RADIOACTIVE SEED AND SENTINEL LYMPH NODE BIOPSY;  Surgeon: Stark Klein, MD;  Location: [place];  Service: General;  Laterality: Left;  . BREAST REDUCTION SURGERY Bilateral 1998  . CATARACT EXTRACTION W/ INTRAOCULAR LENS IMPLANT Left 2016  . Sleep[place] Eye   hallo  . COLONOSCOPY  01/2013   polyps  . EYE SURGERY Left 2016  . HAND SURGERY Left 02-19-2002   dr Fredna Dow  _0    repair collateral ligament/ MPJ left thumb  . HARDWARE REMOVAL N/A 07/24/2019   Procedure: REVISION OF HARDWARE Lumbar two - Sacral one. Extention of Fusion to Lumbar one;  Surgeon: [name], MD;  Location: [place];  Service: Neurosurgery;  Laterality: N/A;  . HEMORRHOID SURGERY  2008  . HIGH RESOLUTION ANOSCOPY N/A 02/28/2018   Procedure: HIGH RESOLUTION ANOSCOPY WITH  BIOPSY;  Surgeon: Leighton Ruff, MD;  Location: [facility];  Service: General;  Laterality: N/A;  . KNEE ARTHROSCOPY Right 2002  . LUMBAR SPINE SURGERY  03-20-2015   dr Kathyrn Sheriff   fusion L4-5, L5-S1  . MOUTH SURGERY  07/14/2018   2 infected teeth removed  . MULTIPLE TOOTH EXTRACTIONS     with bone grafting lower bottom right and left  . REDUCTION MAMMAPLASTY Bilateral   . RIGHT COLECTOMY  09-10-2003   dr Margot Chimes _1    multiple colon polyps (per path tubular adenoma's, hyperplastic , benign appendix, benign two lymph nodes  . ROTATOR CUFF REPAIR Left 04/2016  . TONSILLECTOMY  1969  . TUBAL LIGATION Bilateral 1982    Vitals:   01/07/20 1332  BP: (!) 164/83  Pulse: (!) 101  SpO2: 96%     Subjective Assessment - 01/07/20 1341    Subjective Pt. reports (back) pain not as bad as last week with LBP 4/10 pre-tx. No new complaints/concerns otherwise.    Currently in Pain? Yes    Pain Score 4     Pain Location Back    Pain Orientation Lower    Pain Descriptors / Indicators Aching;Sharp    Pain Type Chronic pain    Pain Onset More than a month ago    Pain Frequency Constant    Aggravating Factors  prolonged standing or sitting    Pain Relieving Factors rest, ice, medication    Effect of Pain on Daily Activities limits posiitonal and activity tolerance                             [facility] Adult PT Treatment/Exercise - 01/07/20 0001      Ambulation/Gait   Ambulation Distance (Feet) 175 Feet    Assistive device Straight cane    Gait Pattern Step-through pattern    Gait Comments 175 feet SBA with SPC with min cues for sequencing for step-through  gait pattern. Limited to distance as noted per pt. request due to fatigue at end of session      Lumbar Exercises: Stretches   Single Knee to Chest Stretch Right;Left;5 reps;10 seconds      Lumbar Exercises: Aerobic   Nustep L4 x 10 min UE/LE   RPE 3/10 (Modified Borg)     Lumbar Exercises: Standing    Other Standing Lumbar Exercises step LZJ:QBHAL with bilat. UE support on counter to 6 in. step x 15 reps ea. bilat.      Lumbar Exercises: Seated   Other Seated Lumbar Exercises seated shoulder extension isometric with abdominal bracing with hands on 85 cm  P-ball, cues to avoid valsalva      Lumbar Exercises: Supine   Pelvic Tilt 15 reps    Pelvic Tilt Limitations verbal and tactile cues for form    Clam 15 reps    Clam Limitations Red Theraband    Bent Knee Raise 15 reps    Bent Knee Raise Limitations with abdominal bracing/posterior pelvic tilt    Other Supine Lumbar Exercises abdominal bracing with alternating shoulder flexion holding 2 lb. DB x 15 ea. bilat.      Lumbar Exercises: Quadruped   Other Quadruped Lumbar Exercises glut set x 15 reps 5 sec holds                  PT Education - 01/07/20 1417    Education Details exercises, gait    Person(s) Educated Patient    Methods Explanation    Comprehension Verbalized understanding            PT Short Term Goals - 12/19/19 1432      PT SHORT TERM GOAL #1   Title Independent with HEP    Baseline met for initial HEP, will update prn    Time 3    Period Weeks    Status Achieved      PT SHORT TERM GOAL #2   Title Improve TUG time (with RW) at least 3-5 sec for decreased fall risk    Baseline 13 sec-improved from baseline but goal still ongoing    Time 3    Period Weeks    Status On-going    Target Date 01/09/20      PT SHORT TERM GOAL #3   Title Ambulate at least 100 feet CGA with SPC for progression to LTG for gait    Baseline met/able    Time 3    Period Weeks    Status Achieved             PT Long Term Goals - 12/19/19 1434      PT LONG TERM GOAL #1   Title Improve [place] Gait + Balance at least 3-5 points for decreased fall risk    Baseline 19/28    Time 6    Period Weeks    Status On-going    Target Date 01/30/20      PT LONG TERM GOAL #2   Title Increase left hip and ankle strength at  least grossly 1/2 MMT grade to improve ability to ascend/descend stairs to 2nd floor bedroom    Baseline see objective-partially met    Time 6    Period Weeks    Status Partially Met    Target Date 01/30/20      PT LONG TERM GOAL #3   Title Perform household level ambulation 150 feet or less mod I with SPC vs. LRAD    Baseline progressing in therapy with gait with SPC but continues with RW use    Time 6    Period Weeks    Status On-going    Target Date 01/30/20      PT LONG TERM GOAL #4   Title Improve 5 times sit<>stand time to 14 sce or less for improved ability for transfers from low seats    Baseline met-11.7 seconds 12/19/19    Time 6    Period Weeks    Status Achieved      PT LONG TERM GOAL #5   Title Tolerate standing for periods at least 15-20 min for cooking and light chores with LBP  4/10 or less    Baseline still ongoing with higher pain level    Time 6    Period Weeks    Status On-going    Target Date 01/30/20                 Plan - 01/07/20 1418    Clinical Impression Statement Included mat-based core/lumbar strengthening today with min-mod cues for form to maintain abdominal bracing and posterior pelvic tilt. O2 sats monitored multiple times throughout session and maintained >/= 90% on room air so saturations levels seem to be improving but still limited in activity tolerance with gait training performed at end of session abbreviated per pt. request from fatigue. Still with concern high BP level so continue to recommend monitor at home and see MD as needed to address HTN.    Personal Factors and Comorbidities Comorbidity 3+;Time since onset of injury/illness/exacerbation;Age    Comorbidities see PMH    Examination-Activity Limitations Sit;Dressing;Bathing;Transfers;Sleep;Hygiene/Grooming;Bed Mobility;Bend;Lift;Squat;Stairs;Locomotion Level;Carry;Stand;Continence;Toileting    Examination-Participation Restrictions Community Activity;Shop;Cleaning;Laundry;Meal  Prep    Stability/Clinical Decision Making Evolving/Moderate complexity    Clinical Decision Making Moderate    Rehab Potential Good    PT Frequency 2x / week    PT Duration 6 weeks    PT Treatment/Interventions ADLs/Self Care Home Management;Cryotherapy;Moist Heat;Therapeutic activities;Gait training;Therapeutic exercise;Patient/family education;Manual techniques;Stair training;Functional mobility training;Balance training;Neuromuscular re-education;Taping    PT Next Visit Plan monitor vitals/O2 sats, continue gait with SPC,  standing exercise/functional activity progression, continue core + LE strengthening,    PT Home Exercise Plan Access Code: [email or web address]: [email or web address]: 08/31/2021Prepared by: Caesar Bookman Squat with Counter Support - 2 x daily - 7 x weekly - 2 sets - 10 repsStanding March with Counter Support - 2 x daily - 7 x weekly - 3 sets - 10 repsStanding Heel Raise with Support - 2 x daily - 7 x weekly - 3 sets - 10 repsStanding Hip Abduction with Counter Support - 2 x daily - 7 x weekly - 3 sets - 10 reps    Consulted and Agree with Plan of Care Patient           Patient will benefit from skilled therapeutic intervention in order to improve the following deficits and impairments:  Abnormal gait, Decreased balance, Decreased endurance, Difficulty walking, Impaired sensation, Pain, Impaired flexibility, Decreased strength, Decreased activity tolerance  Visit Diagnosis: Chronic bilateral low back pain, unspecified whether sciatica present  Muscle weakness (generalized)  Other abnormalities of gait and mobility     Problem List Patient Active Problem List   Diagnosis Date Noted  . Insomnia 10/29/2019  . Recurrent falls 09/06/2019  . Confusion, postoperative   . Benign essential HTN   . Post-operative pain   . Chronic constipation   . 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 Post op infection 07/11/2019  . Muscle spasm   .  Hypokalemia   . Status post lumbar spinal fusion 07/10/2019  . Postoperative pain   . Acute blood loss anemia   . Labile blood pressure   . Tobacco abuse   . Hypoalbuminemia due to protein-calorie malnutrition ([place])   . Anxiety 07/05/2019  . Depression 07/05/2019  . Chronic diastolic CHF (congestive heart failure) ([place]) 07/05/2019  . Obesity (BMI 30-39.9) 07/05/2019  . ETOH abuse 07/05/2019  . Lumbar radiculopathy 06/28/2019  . Spinal stenosis of lumbar region 06/28/2019  . Acute on chronic anemia   . Tremors of nervous system   . AKI (acute kidney injury) ([place])   . Chronic pain syndrome   .  Acute renal failure superimposed on stage 3 chronic kidney disease ([place]) 06/25/2019  . Acute osteomyelitis of lumbar spine ([place]) 06/25/2019  . Weakness of left lower extremity   . Abdominal pain 06/24/2019  . Nausea 06/24/2019  . Abnormal urine odor 06/24/2019  . History of sepsis 06/24/2019  . Recent major surgery 06/24/2019  . History of diverticulosis 06/24/2019  . Elevated PTHrP level 05/09/2019  . Paresthesia 02/12/2019  . Low back pain 02/12/2019  . Neuropathy 12/20/2018  . Lumbar spinal stenosis 06/29/2018  . Preoperative clearance 06/04/2018  . Abnormal CT of the chest 06/04/2018  . Smoker 05/17/2018  . Hypothyroidism 11/30/2017  . Thoracic aortic atherosclerosis ([place]) 09/14/2016  . Benign essential tremor 09/09/2016  . RLS (restless legs syndrome) 09/09/2016  . H/O adenomatous polyp of colon 07/06/2016  . PVC's (premature ventricular contractions) 03/29/2016  . Preoperative cardiovascular examination 03/29/2016  . Grade I diastolic dysfunction 06/77/0340  . Palpitations 01/11/2016  . Shortness of breath 01/11/2016  . Former cigarette smoker 01/11/2016  . Leg edema 01/11/2016  . Chronic cough 10/28/2015  . 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 Malignant neoplasm of upper-inner quadrant of left breast in female, estrogen receptor positive ([place]) 07/14/2015  . Prediabetes  07/08/2015  . CKD (chronic kidney disease), stage III 07/08/2015  . Dependent edema 06/08/2015  . Sleep disturbance 06/08/2015  . Mixed hyperlipidemia 06/08/2015  . Generalized anxiety disorder 06/08/2015  . Essential hypertension 06/08/2015  . COPD (chronic obstructive pulmonary disease) ([place]) 06/08/2015  . Gout 06/08/2015  . Macrocytic anemia   . 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 Sepsis ([place]) 05/15/2015  . Lumbar spondylosis 03/20/2015    Beaulah Dinning, PT, DPT 01/07/20 2:21 PM   [place] [address] [place], [place], [address] Phone: [phone]   Fax:  [phone]  Name: Brandi Stephens MRN: [id number] Date of Birth: [birth year]

## 2020-01-09 ENCOUNTER — Ambulatory Visit: Payer: PPO | Admitting: Physical Therapy

## 2020-01-13 ENCOUNTER — Encounter: Payer: PPO | Admitting: Physical Medicine and Rehabilitation

## 2020-01-16 ENCOUNTER — Ambulatory Visit: Payer: PPO | Admitting: Physical Therapy

## 2020-01-16 ENCOUNTER — Other Ambulatory Visit: Payer: Self-pay

## 2020-01-16 ENCOUNTER — Encounter: Payer: Self-pay | Admitting: Physical Therapy

## 2020-01-16 VITALS — BP 169/84 | HR 87

## 2020-01-16 DIAGNOSIS — G8929 Other chronic pain: Secondary | ICD-10-CM

## 2020-01-16 DIAGNOSIS — M5416 Radiculopathy, lumbar region: Secondary | ICD-10-CM

## 2020-01-16 DIAGNOSIS — M545 Low back pain: Secondary | ICD-10-CM | POA: Diagnosis not present

## 2020-01-16 DIAGNOSIS — M6281 Muscle weakness (generalized): Secondary | ICD-10-CM

## 2020-01-16 DIAGNOSIS — R2689 Other abnormalities of gait and mobility: Secondary | ICD-10-CM

## 2020-01-16 NOTE — Therapy (Signed)
[place], [place], [address] Phone: [phone]   Fax:  [phone]  Physical Therapy Treatment  Patient Details  Name: Brandi Stephens MRN: [id number] Date of Birth: 18-May-1945 Referring Provider (PT): Leeroy Cha, MD   Encounter Date: 01/16/2020   PT End of Session - 01/16/20 1245    Visit Number 12    Number of Visits 18    Date for PT Re-Evaluation 01/30/20    Authorization Type [place] Medicare, next progress note by visit 107    PT Start Time 42    PT Stop Time 1315    PT Time Calculation (min) 45 min           Past Medical History:  Diagnosis Date  . AIN (anal intraepithelial neoplasia) anal canal   . Anemia    with pregnancy  . Anxiety   . Arthritis    knees, lower back, knees  . Chronic constipation   . Chronic diastolic CHF (congestive heart failure) ([place]) 07/05/2019  . CKD (chronic kidney disease)   . Coarse tremors    Essential  . COPD (chronic obstructive pulmonary disease) ([place])    2017 chest CT, history of  . Degenerative lumbar spinal stenosis   . Depression   . Depression 07/05/2019  . Drug dependency ([place])   . Elevated PTHrP level 05/09/2019  . ETOH abuse 07/05/2019  . GERD (gastroesophageal reflux disease)    pt denies  . Gout   . Grade I diastolic dysfunction 63/04/6008   Noted ECHO  . Hemorrhoids   . History of adenomatous polyp of colon   . History of basal cell carcinoma (BCC) excision 2015   nose  . History of sepsis 05/14/2014   post lumbar surgery  . History of shingles    x2  . Hyperlipidemia   . Hypertension   . Hypothyroidism   . Irregular heart beat    history of while undergoing radiation  . Malignant neoplasm of upper-inner quadrant of left breast in female, estrogen receptor positive [facility]) oncologist-  dr Jana Hakim--  per lov in epic no recurrence   dx 07-13-2015  Left breast invasive ductal carcinoma, Stage IA, Grade 1 (TXN0),  09-16-2015  s/p   left breast lumpectomy with sln bx's,  completed radiation 11-18-2015,  started antiestrogen therapy 12-14-2015  . Neuropathy    Left foot and back of left leg  . Obesity (BMI 30-39.9) 07/05/2019  . Personal history of radiation therapy    completed 11-18-2015  left breast  . Pneumonia   . PONV (postoperative nausea and vomiting)   . Prediabetes    diet controlled, no med  . Restless leg syndrome   . Seasonal allergies   . Seizure ([place]) 05/2015   due to sepsis, only one time episode  . Tremor   . Wears partial dentures    lower     Past Surgical History:  Procedure Laterality Date  . ABDOMINAL HYSTERECTOMY  1985  . BASAL CELL CARCINOMA EXCISION  10/15   nose  . BREAST BIOPSY Right 08/24/2015  . BREAST BIOPSY Right 07/13/2015  . BREAST BIOPSY Left 07/13/2015  . BREAST EXCISIONAL BIOPSY Left 1972  . BREAST LUMPECTOMY Left   . BREAST LUMPECTOMY WITH RADIOACTIVE SEED AND SENTINEL LYMPH NODE BIOPSY Left 09/16/2015   Procedure: BREAST LUMPECTOMY WITH RADIOACTIVE SEED AND SENTINEL LYMPH NODE BIOPSY;  Surgeon: Stark Klein, MD;  Location: [facility];  Service: General;  Laterality: Left;  . BREAST  REDUCTION SURGERY Bilateral 1998  . CATARACT EXTRACTION W/ INTRAOCULAR LENS IMPLANT Left 2016  . [place]   hallo  . COLONOSCOPY  01/2013   polyps  . EYE SURGERY Left 2016  . HAND SURGERY Left 02-19-2002   dr Fredna Dow  _0    repair collateral ligament/ MPJ left thumb  . HARDWARE REMOVAL N/A 07/24/2019   Procedure: REVISION OF HARDWARE Lumbar two - Sacral one. Extention of Fusion to Lumbar one;  Surgeon: [name], MD;  Location: [facility];  Service: Neurosurgery;  Laterality: N/A;  . HEMORRHOID SURGERY  2008  . HIGH RESOLUTION ANOSCOPY N/A 02/28/2018   Procedure: HIGH RESOLUTION ANOSCOPY WITH BIOPSY;  Surgeon: Leighton Ruff, MD;  Location: [facility];  Service: General;  Laterality: N/A;  . KNEE ARTHROSCOPY Right 2002  . LUMBAR SPINE  SURGERY  03-20-2015   dr Kathyrn Sheriff   fusion L4-5, L5-S1  . MOUTH SURGERY  07/14/2018   2 infected teeth removed  . MULTIPLE TOOTH EXTRACTIONS     with bone grafting lower bottom right and left  . REDUCTION MAMMAPLASTY Bilateral   . RIGHT COLECTOMY  09-10-2003   dr Margot Chimes _1    multiple colon polyps (per path tubular adenoma's, hyperplastic , benign appendix, benign two lymph nodes  . ROTATOR CUFF REPAIR Left 04/2016  . TONSILLECTOMY  1969  . TUBAL LIGATION Bilateral 1982    Vitals:   01/16/20 1238 01/16/20 1304 01/16/20 1320  BP: (!) 169/84    Pulse: 87    SpO2: 92% (!) 80% 95%     Subjective Assessment - 01/16/20 1238    Subjective Back pain is getting better.    Pertinent History L4-S1 fusion 03/20/15, L2-3 fusion 05/30/19, breast CA history 2017, seizures (due to sepsis),  lumbar hardware removal 07/24/19 with subsequent staph infection/osteomyelitis, CHF, COPD, chronic pain in pain management, history ETOH abuse, neuropathy, intention tremor    Currently in Pain? Yes    Pain Score 3     Pain Location Back    Pain Orientation Lower    Pain Descriptors / Indicators Aching    Pain Type Chronic pain                             [facility] Adult PT Treatment/Exercise - 01/16/20 0001      Ambulation/Gait   Ambulation Distance (Feet) 180 Feet    Assistive device Straight cane    Gait Pattern Step-through pattern    Gait Comments SBA      Lumbar Exercises: Supine   Pelvic Tilt 15 reps    Pelvic Tilt Limitations verbal and tactile cues for form    Heel Slides 10 reps    Heel Slides Limitations feet on ball with ab brace    Bent Knee Raise 10 reps    Bent Knee Raise Limitations with abdominal bracing/posterior pelvic tilt    Bridge 10 reps    Bridge Limitations small    Straight Leg Raise 10 reps    Straight Leg Raises Limitations with abdominal draw in     Large Ball Abdominal Isometric 10 reps    Large Ball Abdominal Isometric Limitations cues for breathing      Other Supine Lumbar Exercises abdominal bracing with alternating shoulder flexion holding 2 lb. DB x 15 ea. bilat., Added alternating LE 2 sets x 15  PT Short Term Goals - 12/19/19 1432      PT SHORT TERM GOAL #1   Title Independent with HEP    Baseline met for initial HEP, will update prn    Time 3    Period Weeks    Status Achieved      PT SHORT TERM GOAL #2   Title Improve TUG time (with RW) at least 3-5 sec for decreased fall risk    Baseline 13 sec-improved from baseline but goal still ongoing    Time 3    Period Weeks    Status On-going    Target Date 01/09/20      PT SHORT TERM GOAL #3   Title Ambulate at least 100 feet CGA with SPC for progression to LTG for gait    Baseline met/able    Time 3    Period Weeks    Status Achieved             PT Long Term Goals - 12/19/19 1434      PT LONG TERM GOAL #1   Title Improve [place] Gait + Balance at least 3-5 points for decreased fall risk    Baseline 19/28    Time 6    Period Weeks    Status On-going    Target Date 01/30/20      PT LONG TERM GOAL #2   Title Increase left hip and ankle strength at least grossly 1/2 MMT grade to improve ability to ascend/descend stairs to 2nd floor bedroom    Baseline see objective-partially met    Time 6    Period Weeks    Status Partially Met    Target Date 01/30/20      PT LONG TERM GOAL #3   Title Perform household level ambulation 150 feet or less mod I with SPC vs. LRAD    Baseline progressing in therapy with gait with SPC but continues with RW use    Time 6    Period Weeks    Status On-going    Target Date 01/30/20      PT LONG TERM GOAL #4   Title Improve 5 times sit<>stand time to 14 sce or less for improved ability for transfers from low seats    Baseline met-11.7 seconds 12/19/19    Time 6    Period Weeks    Status Achieved      PT LONG TERM GOAL #5   Title Tolerate standing for periods at least 15-20 min for cooking and light  chores with LBP 4/10 or less    Baseline still ongoing with higher pain level    Time 6    Period Weeks    Status On-going    Target Date 01/30/20                 Plan - 01/16/20 1321    Clinical Impression Statement Initial SPO2 92%. Began with gait today with pt SPO2 dropping to 80% after 180 ft gait with SPC. She requires cues to slow her pace with ambulation and exercise. She also requires cues for breathing during therex and ambulation. Mostly mat based core exercise today due to SPO2 level.    PT Next Visit Plan monitor vitals/O2 sats, continue gait with SPC,  standing exercise/functional activity progression, continue core + LE strengthening,    PT Home Exercise Plan Access Code: [email or web address]: [email or web address]: 08/31/2021Prepared by: Caesar Bookman Squat with Counter Support - 2 x daily - 7 x weekly - 2 sets -  10 repsStanding March with Counter Support - 2 x daily - 7 x weekly - 3 sets - 10 repsStanding Heel Raise with Support - 2 x daily - 7 x weekly - 3 sets - 10 repsStanding Hip Abduction with Counter Support - 2 x daily - 7 x weekly - 3 sets - 10 reps           Patient will benefit from skilled therapeutic intervention in order to improve the following deficits and impairments:  Abnormal gait, Decreased balance, Decreased endurance, Difficulty walking, Impaired sensation, Pain, Impaired flexibility, Decreased strength, Decreased activity tolerance  Visit Diagnosis: Chronic bilateral low back pain, unspecified whether sciatica present  Muscle weakness (generalized)  Other abnormalities of gait and mobility  Lumbar radiculopathy     Problem List Patient Active Problem List   Diagnosis Date Noted  . Insomnia 10/29/2019  . Recurrent falls 09/06/2019  . Confusion, postoperative   . Benign essential HTN   . Post-operative pain   . Chronic constipation   . 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 Post op infection 07/11/2019  .  Muscle spasm   . Hypokalemia   . Status post lumbar spinal fusion 07/10/2019  . Postoperative pain   . Acute blood loss anemia   . Labile blood pressure   . Tobacco abuse   . Hypoalbuminemia due to protein-calorie malnutrition ([place])   . Anxiety 07/05/2019  . Depression 07/05/2019  . Chronic diastolic CHF (congestive heart failure) ([place]) 07/05/2019  . Obesity (BMI 30-39.9) 07/05/2019  . ETOH abuse 07/05/2019  . Lumbar radiculopathy 06/28/2019  . Spinal stenosis of lumbar region 06/28/2019  . Acute on chronic anemia   . Tremors of nervous system   . AKI (acute kidney injury) ([place])   . Chronic pain syndrome   . Acute renal failure superimposed on stage 3 chronic kidney disease ([place]) 06/25/2019  . Acute osteomyelitis of lumbar spine ([place]) 06/25/2019  . Weakness of left lower extremity   . Abdominal pain 06/24/2019  . Nausea 06/24/2019  . Abnormal urine odor 06/24/2019  . History of sepsis 06/24/2019  . Recent major surgery 06/24/2019  . History of diverticulosis 06/24/2019  . Elevated PTHrP level 05/09/2019  . Paresthesia 02/12/2019  . Low back pain 02/12/2019  . Neuropathy 12/20/2018  . Lumbar spinal stenosis 06/29/2018  . Preoperative clearance 06/04/2018  . Abnormal CT of the chest 06/04/2018  . Smoker 05/17/2018  . Hypothyroidism 11/30/2017  . Thoracic aortic atherosclerosis ([place]) 09/14/2016  . Benign essential tremor 09/09/2016  . RLS (restless legs syndrome) 09/09/2016  . H/O adenomatous polyp of colon 07/06/2016  . PVC's (premature ventricular contractions) 03/29/2016  . Preoperative cardiovascular examination 03/29/2016  . Grade I diastolic dysfunction 59/45/8592  . Palpitations 01/11/2016  . Shortness of breath 01/11/2016  . Former cigarette smoker 01/11/2016  . Leg edema 01/11/2016  . Chronic cough 10/28/2015  . 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 Malignant neoplasm of upper-inner quadrant of left breast in female, estrogen receptor positive ([place])  07/14/2015  . Prediabetes 07/08/2015  . CKD (chronic kidney disease), stage III 07/08/2015  . Dependent edema 06/08/2015  . Sleep disturbance 06/08/2015  . Mixed hyperlipidemia 06/08/2015  . Generalized anxiety disorder 06/08/2015  . Essential hypertension 06/08/2015  . COPD (chronic obstructive pulmonary disease) ([place]) 06/08/2015  . Gout 06/08/2015  . Macrocytic anemia   . 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 Sepsis ([place]) 05/15/2015  . Lumbar spondylosis 03/20/2015    Dorene Ar, PTA 01/16/2020, 2:16 PM  [facility] [address] [place], [place], [address] Phone: [phone]   Fax:  [phone]  Name: Brandi Stephens MRN: [id number] Date of Birth: Feb 15, 1946

## 2020-01-21 ENCOUNTER — Other Ambulatory Visit: Payer: Self-pay

## 2020-01-21 ENCOUNTER — Ambulatory Visit: Payer: PPO | Attending: Physical Medicine and Rehabilitation | Admitting: Physical Therapy

## 2020-01-21 ENCOUNTER — Encounter: Payer: Self-pay | Admitting: Physical Therapy

## 2020-01-21 VITALS — BP 151/82 | HR 106

## 2020-01-21 DIAGNOSIS — M6281 Muscle weakness (generalized): Secondary | ICD-10-CM | POA: Diagnosis not present

## 2020-01-21 DIAGNOSIS — M5416 Radiculopathy, lumbar region: Secondary | ICD-10-CM | POA: Insufficient documentation

## 2020-01-21 DIAGNOSIS — M4620 Osteomyelitis of vertebra, site unspecified: Secondary | ICD-10-CM | POA: Diagnosis not present

## 2020-01-21 DIAGNOSIS — T8140XA Infection following a procedure, unspecified, initial encounter: Secondary | ICD-10-CM | POA: Diagnosis not present

## 2020-01-21 DIAGNOSIS — G8929 Other chronic pain: Secondary | ICD-10-CM | POA: Insufficient documentation

## 2020-01-21 DIAGNOSIS — R2689 Other abnormalities of gait and mobility: Secondary | ICD-10-CM | POA: Diagnosis not present

## 2020-01-21 DIAGNOSIS — M545 Low back pain, unspecified: Secondary | ICD-10-CM | POA: Diagnosis not present

## 2020-01-21 NOTE — Therapy (Signed)
[place], [place], [address] Phone: [phone]   Fax:  [phone]  Physical Therapy Treatment  Patient Details  Name: Brandi Stephens MRN: [id number] Date of Birth: 1945-05-13 Referring Provider (PT): Leeroy Cha, MD   Encounter Date: 01/21/2020   PT End of Session - 01/21/20 1342    Visit Number 13    Number of Visits 18    Date for PT Re-Evaluation 01/30/20    Authorization Type [place] Medicare, next progress note by visit 17    PT Start Time 1332    PT Stop Time 1413    PT Time Calculation (min) 41 min    Activity Tolerance Patient limited by fatigue    Behavior During Therapy [facility] for tasks assessed/performed           Past Medical History:  Diagnosis Date  . AIN (anal intraepithelial neoplasia) anal canal   . Anemia    with pregnancy  . Anxiety   . Arthritis    knees, lower back, knees  . Chronic constipation   . Chronic diastolic CHF (congestive heart failure) ([place]) 07/05/2019  . CKD (chronic kidney disease)   . Coarse tremors    Essential  . COPD (chronic obstructive pulmonary disease) ([name])    2017 chest CT, history of  . Degenerative lumbar spinal stenosis   . Depression   . Depression 07/05/2019  . Drug dependency ([place])   . Elevated PTHrP level 05/09/2019  . ETOH abuse 07/05/2019  . GERD (gastroesophageal reflux disease)    pt denies  . Gout   . Grade I diastolic dysfunction 16/10/3708   Noted ECHO  . Hemorrhoids   . History of adenomatous polyp of colon   . History of basal cell carcinoma (BCC) excision 2015   nose  . History of sepsis 05/14/2014   post lumbar surgery  . History of shingles    x2  . Hyperlipidemia   . Hypertension   . Hypothyroidism   . Irregular heart beat    history of while undergoing radiation  . Malignant neoplasm of upper-inner quadrant of left breast in female, estrogen receptor positive [facility]) oncologist-  dr Jana Hakim--  per lov in epic no  recurrence   dx 07-13-2015  Left breast invasive ductal carcinoma, Stage IA, Grade 1 (TXN0),  09-16-2015  s/p  left breast lumpectomy with sln bx's,  completed radiation 11-18-2015,  started antiestrogen therapy 12-14-2015  . Neuropathy    Left foot and back of left leg  . Obesity (BMI 30-39.9) 07/05/2019  . Personal history of radiation therapy    completed 11-18-2015  left breast  . Pneumonia   . PONV (postoperative nausea and vomiting)   . Prediabetes    diet controlled, no med  . Restless leg syndrome   . Seasonal allergies   . Seizure ([place]) 05/2015   due to sepsis, only one time episode  . Tremor   . Wears partial dentures    lower     Past Surgical History:  Procedure Laterality Date  . ABDOMINAL HYSTERECTOMY  1985  . BASAL CELL CARCINOMA EXCISION  10/15   nose  . BREAST BIOPSY Right 08/24/2015  . BREAST BIOPSY Right 07/13/2015  . BREAST BIOPSY Left 07/13/2015  . BREAST EXCISIONAL BIOPSY Left 1972  . BREAST LUMPECTOMY Left   . BREAST LUMPECTOMY WITH RADIOACTIVE SEED AND SENTINEL LYMPH NODE BIOPSY Left 09/16/2015   Procedure: BREAST LUMPECTOMY WITH RADIOACTIVE SEED AND SENTINEL LYMPH NODE BIOPSY;  Surgeon: Stark Klein, MD;  Location: [place];  Service: General;  Laterality: Left;  . BREAST REDUCTION SURGERY Bilateral 1998  . CATARACT EXTRACTION W/ INTRAOCULAR LENS IMPLANT Left 2016  . [place]   hallo  . COLONOSCOPY  01/2013   polyps  . EYE SURGERY Left 2016  . HAND SURGERY Left 02-19-2002   dr Fredna Dow  '@[email or web address]'    repair collateral ligament/ MPJ left thumb  . HARDWARE REMOVAL N/A 07/24/2019   Procedure: REVISION OF HARDWARE Lumbar two - Sacral one. Extention of Fusion to Lumbar one;  Surgeon: [name], MD;  Location: [place];  Service: Neurosurgery;  Laterality: N/A;  . HEMORRHOID SURGERY  2008  . HIGH RESOLUTION ANOSCOPY N/A 02/28/2018   Procedure: HIGH RESOLUTION ANOSCOPY WITH BIOPSY;  Surgeon: Leighton Ruff, MD;  Location:  [facility];  Service: General;  Laterality: N/A;  . KNEE ARTHROSCOPY Right 2002  . LUMBAR SPINE SURGERY  03-20-2015   dr Kathyrn Sheriff   fusion L4-5, L5-S1  . MOUTH SURGERY  07/14/2018   2 infected teeth removed  . MULTIPLE TOOTH EXTRACTIONS     with bone grafting lower bottom right and left  . REDUCTION MAMMAPLASTY Bilateral   . RIGHT COLECTOMY  09-10-2003   dr Margot Chimes '@[email or web address]'    multiple colon polyps (per path tubular adenoma's, hyperplastic , benign appendix, benign two lymph nodes  . ROTATOR CUFF REPAIR Left 04/2016  . TONSILLECTOMY  1969  . TUBAL LIGATION Bilateral 1982    Vitals:   01/21/20 1334  BP: (!) 151/82  Pulse: (!) 106  SpO2: 96%     Subjective Assessment - 01/21/20 1336    Subjective Low back pain still improving with (LBP) 2/10 this PM. Walking at home with less need AD as well.    Pertinent History L4-S1 fusion 03/20/15, L2-3 fusion 05/30/19, breast CA history 2017, seizures (due to sepsis),  lumbar hardware removal 07/24/19 with subsequent staph infection/osteomyelitis, CHF, COPD, chronic pain in pain management, history ETOH abuse, neuropathy, intention tremor              [facility] PT Assessment - 01/21/20 0001      Ambulation/Gait   Ambulation Distance (Feet) 190 Feet    Assistive device Straight cane    Gait Pattern Step-through pattern    Gait Comments SBA with min cues for sequencing for step-through gait pattern                         [facility] Adult PT Treatment/Exercise - 01/21/20 0001      Lumbar Exercises: Aerobic   Nustep L5 x 5 min UE/LE      Lumbar Exercises: Standing   Row AROM;Strengthening;15 reps    Theraband Level (Row) Level 4 (Blue)    Shoulder Extension AROM;Strengthening;Both;15 reps    Theraband Level (Shoulder Extension) Level 4 (Blue)    Other Standing Lumbar Exercises front step ups x 10 ea. side bilat. to 6 oin. step, side step ups x 10 ea. side bilat. to 4 in. step both with bilat. UE support at counter       Lumbar Exercises: Seated   Sit to Stand 15 reps    Sit to Stand Limitations as "touch and go" squat to low table with incline wedge       Lumbar Exercises: Supine   Pelvic Tilt 20 reps    Pelvic Tilt Limitations verbal and tactile cues for form and to avoid compensatory glut  activation    Heel Slides 10 reps    Heel Slides Limitations feet on ball with ab brace    Bent Knee Raise 10 reps    Bent Knee Raise Limitations with abdominal bracing/posterior pelvic tilt    Bridge 15 reps    Bridge Limitations small    Straight Leg Raise 10 reps    Straight Leg Raises Limitations with abdominal draw in     Large Ball Abdominal Isometric 10 reps    Large Ball Abdominal Isometric Limitations cues for breathing     Other Supine Lumbar Exercises abdominal bracing wit alt. DB raises 2 lbs. ea. UE x 15 reps    Other Supine Lumbar Exercises clamshell green band 2x10                  PT Education - 01/21/20 1404    Education Details POC    Person(s) Educated Patient    Methods Explanation    Comprehension Verbalized understanding            PT Short Term Goals - 12/19/19 1432      PT SHORT TERM GOAL #1   Title Independent with HEP    Baseline met for initial HEP, will update prn    Time 3    Period Weeks    Status Achieved      PT SHORT TERM GOAL #2   Title Improve TUG time (with RW) at least 3-5 sec for decreased fall risk    Baseline 13 sec-improved from baseline but goal still ongoing    Time 3    Period Weeks    Status On-going    Target Date 01/09/20      PT SHORT TERM GOAL #3   Title Ambulate at least 100 feet CGA with SPC for progression to LTG for gait    Baseline met/able    Time 3    Period Weeks    Status Achieved             PT Long Term Goals - 12/19/19 1434      PT LONG TERM GOAL #1   Title Improve [place]etti Gait + Balance at least 3-5 points for decreased fall risk    Baseline 19/28    Time 6    Period Weeks    Status On-going    Target Date  01/30/20      PT LONG TERM GOAL #2   Title Increase left hip and ankle strength at least grossly 1/2 MMT grade to improve ability to ascend/descend stairs to 2nd floor bedroom    Baseline see objective-partially met    Time 6    Period Weeks    Status Partially Met    Target Date 01/30/20      PT LONG TERM GOAL #3   Title Perform household level ambulation 150 feet or less mod I with SPC vs. LRAD    Baseline progressing in therapy with gait with SPC but continues with RW use    Time 6    Period Weeks    Status On-going    Target Date 01/30/20      PT LONG TERM GOAL #4   Title Improve 5 times sit<>stand time to 14 sce or less for improved ability for transfers from low seats    Baseline met-11.7 seconds 12/19/19    Time 6    Period Weeks    Status Achieved      PT LONG TERM GOAL #5  Title Tolerate standing for periods at least 15-20 min for cooking and light chores with LBP 4/10 or less    Baseline still ongoing with higher pain level    Time 6    Period Weeks    Status On-going    Target Date 01/30/20                 Plan - 01/21/20 1352    Clinical Impression Statement Still with some fatigue limiting activity tolerance but continues to gradually improve from previous status with decreased pain and improving positional tolerance for standing/ambulation and decreased need AD for gait.    Personal Factors and Comorbidities Comorbidity 3+;Time since onset of injury/illness/exacerbation;Age    Comorbidities see PMH    Examination-Activity Limitations Sit;Dressing;Bathing;Transfers;Sleep;Hygiene/Grooming;Bed Mobility;Bend;Lift;Squat;Stairs;Locomotion Level;Carry;Stand;Continence;Toileting    Stability/Clinical Decision Making Evolving/Moderate complexity    Rehab Potential Good    PT Frequency 2x / week    PT Duration 6 weeks    PT Treatment/Interventions ADLs/Self Care Home Management;Cryotherapy;Moist Heat;Therapeutic activities;Gait training;Therapeutic  exercise;Patient/family education;Manual techniques;Stair training;Functional mobility training;Balance training;Neuromuscular re-education;Taping    PT Next Visit Plan monitor vitals/O2 sats, continue gait with SPC,  standing exercise/functional activity progression, continue core + LE strengthening,    PT Home Exercise Plan Access Code: [email or web address]: [email or web address]: 08/31/2021Prepared by: Caesar Bookman Squat with Counter Support - 2 x daily - 7 x weekly - 2 sets - 10 repsStanding March with Counter Support - 2 x daily - 7 x weekly - 3 sets - 10 repsStanding Heel Raise with Support - 2 x daily - 7 x weekly - 3 sets - 10 repsStanding Hip Abduction with Counter Support - 2 x daily - 7 x weekly - 3 sets - 10 reps    Consulted and Agree with Plan of Care Patient           Patient will benefit from skilled therapeutic intervention in order to improve the following deficits and impairments:  Abnormal gait, Decreased balance, Decreased endurance, Difficulty walking, Impaired sensation, Pain, Impaired flexibility, Decreased strength, Decreased activity tolerance  Visit Diagnosis: Chronic bilateral low back pain, unspecified whether sciatica present  Muscle weakness (generalized)  Other abnormalities of gait and mobility     Problem List Patient Active Problem List   Diagnosis Date Noted  . Insomnia 10/29/2019  . Recurrent falls 09/06/2019  . Confusion, postoperative   . Benign essential HTN   . Post-operative pain   . Chronic constipation   . 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 Post op infection 07/11/2019  . Muscle spasm   . Hypokalemia   . Status post lumbar spinal fusion 07/10/2019  . Postoperative pain   . Acute blood loss anemia   . Labile blood pressure   . Tobacco abuse   . Hypoalbuminemia due to protein-calorie malnutrition ([place])   . Anxiety 07/05/2019  . Depression 07/05/2019  . Chronic diastolic CHF (congestive heart failure) ([place])  07/05/2019  . Obesity (BMI 30-39.9) 07/05/2019  . ETOH abuse 07/05/2019  . Lumbar radiculopathy 06/28/2019  . Spinal stenosis of lumbar region 06/28/2019  . Acute on chronic anemia   . Tremors of nervous system   . AKI (acute kidney injury) ([place])   . Chronic pain syndrome   . Acute renal failure superimposed on stage 3 chronic kidney disease ([place]) 06/25/2019  . Acute osteomyelitis of lumbar spine ([place]) 06/25/2019  . Weakness of left lower extremity   . Abdominal pain 06/24/2019  . Nausea 06/24/2019  . Abnormal urine odor 06/24/2019  . History  of sepsis 06/24/2019  . Recent major surgery 06/24/2019  . History of diverticulosis 06/24/2019  . Elevated PTHrP level 05/09/2019  . Paresthesia 02/12/2019  . Low back pain 02/12/2019  . Neuropathy 12/20/2018  . Lumbar spinal stenosis 06/29/2018  . Preoperative clearance 06/04/2018  . Abnormal CT of the chest 06/04/2018  . Smoker 05/17/2018  . Hypothyroidism 11/30/2017  . Thoracic aortic atherosclerosis ([place]) 09/14/2016  . Benign essential tremor 09/09/2016  . RLS (restless legs syndrome) 09/09/2016  . H/O adenomatous polyp of colon 07/06/2016  . PVC's (premature ventricular contractions) 03/29/2016  . Preoperative cardiovascular examination 03/29/2016  . Grade I diastolic dysfunction 20/99/0689  . Palpitations 01/11/2016  . Shortness of breath 01/11/2016  . Former cigarette smoker 01/11/2016  . Leg edema 01/11/2016  . Chronic cough 10/28/2015  . 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 Malignant neoplasm of upper-inner quadrant of left breast in female, estrogen receptor positive ([place]) 07/14/2015  . Prediabetes 07/08/2015  . CKD (chronic kidney disease), stage III 07/08/2015  . Dependent edema 06/08/2015  . Sleep disturbance 06/08/2015  . Mixed hyperlipidemia 06/08/2015  . Generalized anxiety disorder 06/08/2015  . Essential hypertension 06/08/2015  . COPD (chronic obstructive pulmonary disease) ([place]) 06/08/2015  . Gout  06/08/2015  . Macrocytic anemia   . 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 Sepsis ([place]) 05/15/2015  . Lumbar spondylosis 03/20/2015    Beaulah Dinning, PT, DPT 01/21/20 2:13 PM  [place] [facility] [address] [place], [place], [address] Phone: [phone]   Fax:  [phone]  Name: Brandi Stephens MRN: [id number] Date of Birth: 1946-02-15

## 2020-01-21 NOTE — Progress Notes (Signed)
Brandi Stephens is a 74 y.o. female who presents for annual wellness visit, CPE and follow-up on chronic medical conditions.  She has the following concerns:  States she is not fasting today.   Chief Complaint  Patient presents with  . cpe/awv    awv not fasting, wheezing alot.    Her daughter is with her and states patient has been wheezing and coughing recently. Denies fever, chills, chest pain, palpitations, shortness of breath.  Review of Rexene Edison, NP notes from 2020, mild emphysema hx noted on CT chest. Hx of pneumonia that was slow to resolve.  States she has been using albuterol inhaler more often lately. Is not on allergy medication or steroid inhaler.   HTN- BP has been elevated more at PT. She has not been checking it with her cuff at home.  Medications were changed when discharged from hospital. States she was taken off diuretic and   Has not fallen in the past month.   Intermittent diarrhea. Occurs every week or two.  States she has to take magnesium and stool softeners or she gets constipated within a day or two. She has been taking Imodium as needed and this helps.  Has seen GI recently.   Insomnia- taking Seroquel and requests refill.   Osteopenia- states Dr. Jana Hakim has ordered a repeat DEXA for January 2022  Is not currently taking vitamin D supplement.   Hx of prediabetes.   CKD history but renal function has been in normal range lately.   Neuropathy- improved with Cymbalta 60 mg. requests refill.   Aortic atherosclerosis-per CT 06/2019    Immunization History  Administered Date(s) Administered  . Fluad Quad(high Dose 65+) 12/20/2018, 01/22/2020  . Influenza, High Dose Seasonal PF 05/01/2017  . Influenza-Unspecified 12/17/2013, 12/12/2017  . Pneumococcal Conjugate-13 01/14/2014  . Pneumococcal Polysaccharide-23 07/06/2016  . Tdap 07/02/2013  . Zoster Recombinat (Shingrix) 12/12/2017, 01/04/2019   Last Pap smear: hysterectomy Last mammogram:  04/22/20 Last colonoscopy: 2019 Last DEXA: 04/22/20 Dentist: Dr. Mervyn Skeeters Ophtho: Dr. Valetta Close Exercise: PT/ in home excerises  Other doctors caring for patient include: Gerald Stabs and Janett Billow for PT/PTA Dtr. Magrinat- Oncology Dr. Olegario Messier- Neurology Dr. Ranell Patrick- Rehab doctor Dr. Megan Salon- Infectious disease Dr. Kathyrn Sheriff- Neurosurgeon Dr. Ubaldo Glassing- Dermatology [facility]- [facility] -  Pulmonologist- last there in 10/2018     Depression screen:  See questionnaire below.  Depression screen [facility] 2/9 01/22/2020 12/12/2019 10/29/2019 10/02/2019 08/22/2019  Decreased Interest 0 0 0 0 0  Down, Depressed, Hopeless 0 0 0 0 0  PHQ - 2 Score 0 0 0 0 0  Altered sleeping - - - - 3  Tired, decreased energy - - - - 2  Change in appetite - - - - 2  Feeling bad or failure about yourself  - - - - 1  Trouble concentrating - - - - 1  Moving slowly or fidgety/restless - - - - 0  Suicidal thoughts - - - - 0  PHQ-9 Score - - - - 9  Difficult doing work/chores - - - - Not difficult at all  Some recent data might be hidden    Fall Risk Screen: see questionnaire below. Fall Risk  01/22/2020 12/12/2019 10/30/2019 10/29/2019 10/02/2019  Falls in the past year? 1 0 1 1 1   Number falls in past yr: 1 0 - 1 1  Injury with Fall? 1 0 - 1 0  Risk for fall due to : - - - History of fall(s) -  Follow up - - -  Falls evaluation completed -    ADL screen:  See questionnaire below Functional Status Survey: Is the patient deaf or have difficulty hearing?: No Does the patient have difficulty seeing, even when wearing glasses/contacts?: Yes (scar tissue) Does the patient have difficulty concentrating, remembering, or making decisions?: Yes Does the patient have difficulty walking or climbing stairs?: Yes Does the patient have difficulty dressing or bathing?: Yes Does the patient have difficulty doing errands alone such as visiting a doctor's office or shopping?: Yes   End of Life Discussion:  Patient has a living will and  medical power of attorney. MOST form reviewed and signed.   Review of Systems Constitutional: -fever, -chills, -sweats, -unexpected weight change, -anorexia, -fatigue Allergy: -sneezing, -itching, -congestion Dermatology: denies changing moles, rash, lumps, new worrisome lesions ENT: -runny nose, -ear pain, -sore throat, -hoarseness, -sinus pain, -teeth pain, -tinnitus, -hearing loss, -epistaxis Cardiology:  -chest pain, -palpitations, -edema, -orthopnea, -paroxysmal nocturnal dyspnea Respiratory: -+cough, -shortness of breath, +dyspnea on exertion, +wheezing, -hemoptysis Gastroenterology: -abdominal pain, -nausea, -vomiting, +intermittent diarrhea, -constipation, -blood in stool, -changes in bowel movement, -dysphagia Hematology: -bleeding or bruising problems Musculoskeletal: -arthralgias, -myalgias, -joint swelling, +back pain, -neck pain, -cramping, -gait changes Ophthalmology: -vision changes, -eye redness, -itching, -discharge Urology: -dysuria, -difficulty urinating, -hematuria, -urinary frequency, -urgency, -incontinence Neurology: -headache, -weakness, -tingling, -numbness, -speech abnormality, -memory loss, +falls, -dizziness Psychology:  -depressed mood, -agitation, -sleep problems    PHYSICAL EXAM:  BP 130/72   Pulse 88   Ht 5' 7.5" (1.715 m)   Wt 222 lb (100.7 kg)   SpO2 98%   BMI 34.26 kg/m   General Appearance: Alert, cooperative, no distress, appears stated age Head: Normocephalic, without obvious abnormality, atraumatic Eyes: PERRL, conjunctiva/corneas clear, EOM's intact Ears: Normal TM's and external ear canals Nose: mask on  Throat: mask on Neck: Supple, no lymphadenopathy; thyroid: no enlargement/tenderness/nodules Lungs: Clear to auscultation bilaterally without wheezes, rales or ronchi; respirations unlabored Chest Wall: No tenderness or deformity Heart: Regular rate and rhythm Breast Exam: not done  Abdomen: Soft, non-tender, nondistended, normoactive  bowel sounds Genitalia: not done  Extremities: No clubbing, cyanosis or edema Pulses: 2+ and symmetric all extremities Skin: Skin color, texture, turgor normal, no rashes or lesions Lymph nodes: Cervical, supraclavicular normal  Neurologic: CNII-XII intact, normal strength, sensation and gait Psych: Normal mood, affect, hygiene and grooming.  ASSESSMENT/PLAN: Medicare annual wellness visit, subsequent -Here with her daughter for Medicare wellness visit.  She is not fasting.  History of falls but none recent.  She is going to physical therapy which is helpful.  Continue using walker and being very careful at home. No worsening memory or difficulties with ADLs.  Her daughter lives with her who helps care for her. No depression, her mood is good. Advance directives discussed. Medications not changed today. Follow up in  6 months for med check or sooner if needed.   Routine general medical examination at a health care facility - Plan: CBC with Differential/Platelet, Comprehensive metabolic panel -Preventive health care reviewed.  She is not fasting today so she will return for fasting labs.  Orders are in. States Dr. Jana Hakim does her breast and pelvic exams. Recommend healthy lifestyle.  Immunizations reviewed.  She may return for her Covid booster in approximately 3 to 4 weeks.  Flu shot received today.  Discussed safety and health promotion.  Benign essential HTN - Plan: CBC with Differential/Platelet, Comprehensive metabolic panel -Blood pressure in goal range today.  Recommend she check her blood pressure at home with her  usual cuff and let me know if her blood pressure is not in goal range.  Discussed low-sodium diet  Prediabetes - Plan: Hemoglobin A1c -Check hemoglobin A1c and follow-up  Other specified hypothyroidism - Plan: TSH, T4, free -Follow-up pending thyroid function and adjust dose as appropriate  Insomnia, unspecified type -Doing well on Seroquel.  Pulmonary emphysema,  unspecified emphysema type (HCC)  Wheezing  Neuropathy  Diarrhea, unspecified type  Aortic atherosclerosis ([place]) - Plan: Lipid panel  Medication management - Plan: TSH, T4, free, VITAMIN D 25 Hydroxy (Vit-D Deficiency, Fractures), Lipid panel  Osteopenia, unspecified location  Needs flu shot - Plan: Flu Vaccine QUAD High Dose(Fluad)     Discussed monthly self breast exams and yearly mammograms; at least 30 minutes of aerobic activity at least 5 days/week and weight-bearing exercise 2x/week; proper sunscreen use reviewed; healthy diet, including goals of calcium and vitamin D intake and alcohol recommendations (less than or equal to 1 drink/day) reviewed; regular seatbelt use; changing batteries in smoke detectors.  Immunization recommendations discussed.  Colonoscopy recommendations reviewed   Medicare Attestation I have personally reviewed: The patient's medical and social history Their use of alcohol, tobacco or illicit drugs Their current medications and supplements The patient's functional ability including ADLs,fall risks, home safety risks, cognitive, and hearing and visual impairment Diet and physical activities Evidence for depression or mood disorders  The patient's weight, height, and BMI have been recorded in the chart.  I have made referrals, counseling, and provided education to the patient based on review of the above and I have provided the patient with a written personalized care plan for preventive services.     Harland Dingwall, NP-C   01/22/2020

## 2020-01-22 ENCOUNTER — Encounter: Payer: Self-pay | Admitting: Family Medicine

## 2020-01-22 ENCOUNTER — Ambulatory Visit (INDEPENDENT_AMBULATORY_CARE_PROVIDER_SITE_OTHER): Payer: PPO | Admitting: Family Medicine

## 2020-01-22 VITALS — BP 130/72 | HR 88 | Ht 67.5 in | Wt 222.0 lb

## 2020-01-22 DIAGNOSIS — G629 Polyneuropathy, unspecified: Secondary | ICD-10-CM

## 2020-01-22 DIAGNOSIS — R7303 Prediabetes: Secondary | ICD-10-CM | POA: Diagnosis not present

## 2020-01-22 DIAGNOSIS — Z79899 Other long term (current) drug therapy: Secondary | ICD-10-CM

## 2020-01-22 DIAGNOSIS — Z0000 Encounter for general adult medical examination without abnormal findings: Secondary | ICD-10-CM

## 2020-01-22 DIAGNOSIS — R197 Diarrhea, unspecified: Secondary | ICD-10-CM | POA: Diagnosis not present

## 2020-01-22 DIAGNOSIS — I10 Essential (primary) hypertension: Secondary | ICD-10-CM

## 2020-01-22 DIAGNOSIS — J439 Emphysema, unspecified: Secondary | ICD-10-CM | POA: Diagnosis not present

## 2020-01-22 DIAGNOSIS — E038 Other specified hypothyroidism: Secondary | ICD-10-CM | POA: Diagnosis not present

## 2020-01-22 DIAGNOSIS — R062 Wheezing: Secondary | ICD-10-CM | POA: Diagnosis not present

## 2020-01-22 DIAGNOSIS — M8580 Other specified disorders of bone density and structure, unspecified site: Secondary | ICD-10-CM

## 2020-01-22 DIAGNOSIS — G4700 Insomnia, unspecified: Secondary | ICD-10-CM

## 2020-01-22 DIAGNOSIS — I700 Atherosclerosis of aorta: Secondary | ICD-10-CM | POA: Diagnosis not present

## 2020-01-22 DIAGNOSIS — Z23 Encounter for immunization: Secondary | ICD-10-CM

## 2020-01-22 MED ORDER — QUETIAPINE FUMARATE 25 MG PO TABS: 25.0000 mg | ORAL_TABLET | Freq: Every day | ORAL | 1 refills | Status: DC

## 2020-01-22 MED ORDER — DULOXETINE HCL 60 MG PO CPEP: ORAL_CAPSULE | ORAL | 1 refills | Status: DC

## 2020-01-22 NOTE — Patient Instructions (Signed)
  Brandi Stephens , Thank you for taking time to come for your Medicare Wellness Visit. I appreciate your ongoing commitment to your health goals. Please review the following plan we discussed and let me know if I can assist you in the future.   These are the goals we discussed:  Return for your fasting labs.   Keep a close eye on your blood pressure at home. Let me know if you are having readings consistently higher than 130/80.  Eat a low sodium diet   You can get your Pfizer Covid booster in 3-4 weeks. Schedule a nurse visit for this.  Call and schedule with your pulmonologist as discussed.       This is a list of the screening recommended for you and due dates:  Health Maintenance  Topic Date Due  . COVID-19 Vaccine (1) Never done  . Flu Shot  11/17/2019  . Mammogram  12/05/2020  . Colon Cancer Screening  01/05/2021  . Tetanus Vaccine  07/03/2023  . DEXA scan (bone density measurement)  Completed  .  Hepatitis C: One time screening is recommended by Center for Disease Control  (CDC) for  adults born from [birth year] through 1964.   Completed  . Pneumonia vaccines  Completed

## 2020-01-23 ENCOUNTER — Other Ambulatory Visit: Payer: Self-pay

## 2020-01-23 ENCOUNTER — Encounter: Payer: Self-pay | Admitting: Physical Medicine and Rehabilitation

## 2020-01-23 ENCOUNTER — Encounter: Payer: PPO | Attending: Physical Medicine and Rehabilitation | Admitting: Physical Medicine and Rehabilitation

## 2020-01-23 VITALS — BP 130/72 | HR 88 | Ht 67.5 in | Wt 222.0 lb

## 2020-01-23 DIAGNOSIS — M5416 Radiculopathy, lumbar region: Secondary | ICD-10-CM | POA: Diagnosis not present

## 2020-01-23 DIAGNOSIS — Z981 Arthrodesis status: Secondary | ICD-10-CM

## 2020-01-23 DIAGNOSIS — M4620 Osteomyelitis of vertebra, site unspecified: Secondary | ICD-10-CM

## 2020-01-23 DIAGNOSIS — G894 Chronic pain syndrome: Secondary | ICD-10-CM

## 2020-01-23 MED ORDER — DIAZEPAM 5 MG PO TABS: 5.0000 mg | ORAL_TABLET | Freq: Four times a day (QID) | ORAL | 0 refills | Status: DC | PRN

## 2020-01-23 MED ORDER — AMITRIPTYLINE HCL 75 MG PO TABS: 75.0000 mg | ORAL_TABLET | Freq: Every day | ORAL | 3 refills | Status: DC

## 2020-01-23 NOTE — Progress Notes (Signed)
Subjective:    Patient ID: Brandi Stephens, female    DOB: [birth year], 74 y.o.   MRN: [id number]  Due to national recommendations of social distancing because of COVID 63, an audio/video tele-health visit is felt to be the most appropriate encounter for this patient at this time. See MyChart message from today for the patient's consent to a tele-health encounter with [facility]. This is a follow up tele-visit via phone. The patient is at home. MD is at office.    Brandi Stephens presents for follow-up after CIR admission for staph bacteremia after infection of her spinal hardware. Everything is the same since last visit. She is having more trouble with neuropathy in her feet and hands. The Cymbalta 60mg  does help somewhat. She used to get swelling with Gabapentin. The pain is bad during the day and night. Pain is improved to 4. She cannot feel her two little toes. Discussed Lyrica as an option. She would like try the Sprint PNS. Her back pain is the most severe. She has hardware. She follows with Dr. Bridget Hartshorn at the end of the month. The pain in her back feels like a dagger. The pain is more directly on the spine.   The Amitriptyline helps her to sleep a little better. She has improved sleep from 1 to 4 hours. She is having severe neuropathy in her left foot. The Cymbalta is helping with the neuropathic pain in her hands. She has been tolerating this medication well.   Last visit I transitioned her to outaptient therapy and she has an appointment today. She has noted improvements in her strength. She is ambulating steadily with RW today. She has bene trying using a cane with therapy. Outpatient therapy is doing well. She has been taking 1/2 a tablet of hydrocodone to help her tolerate better (prescribed by her surgeon).   Pain is currently 5/10 and worst in her lower back where her spinal harware is present as well as in paraspinals. No longer acquiring She has been  taking approximately 1 percocet per day and would like to gradually stop using these. She has also been using four 650mg  of Tylenol per day and two 200mg  ibuprofen daily.   She no longer has severe diarrhea or constipation and is much happier about this. It does come and go. She takes 1000mg  magensium at night. She takes colace BID. She likes to eat healthy foods.   Her daughter accompanies her this visit and helps to provide history. She and her daughter have been out of work for 5 years due to her health conditions and need for her daughter's assistance. Her grandchild recently graduated kindergarten.   She continues to have mild bilateral lower extremity edema. Discussed use of compression garments, elevation, and ice.    Pain Inventory Average Pain 4 Pain Right Now 2 My pain is constant, sharp, burning, stabbing and aching  In the last 24 hours, has pain interfered with the following? General activity 4 Relation with others 0 Enjoyment of life 5 What TIME of day is your pain at its worst? varies Sleep (in general) Poor  Pain is worse with: walking, bending, sitting and standing Pain improves with: rest, heat/ice, therapy/exercise and medication Relief from Meds: 2        Family History  Problem Relation Age of Onset  . Atrial fibrillation Mother   . Ulcers Father   . Breast cancer Maternal Aunt   . Lung cancer Maternal Uncle   .  Stomach cancer Maternal Grandmother   . Lung cancer Maternal Aunt   . Brain cancer Maternal Aunt   . Prostate cancer Maternal Grandfather   . Stroke Paternal Grandmother   . Tuberculosis Paternal Grandfather   . Colon cancer Neg Hx   . Esophageal cancer Neg Hx   . Rectal cancer Neg Hx    Social History   Socioeconomic History  . Marital status: Married    Spouse name: robert  . Number of children: 3  . Years of education: college  . Highest education level: Not on file  Occupational History  . Occupation: retired  Tobacco Use  .  Smoking status: Former Smoker    Packs/day: 0.50    Years: 35.00    Pack years: 17.50    Types: Cigarettes    Quit date: 05/22/2018    Years since quitting: 1.6  . Smokeless tobacco: Never Used  Vaping Use  . Vaping Use: Never used  Substance and Sexual Activity  . Alcohol use: Yes    Comment: 1 glass of wine a night  . Drug use: Not Currently    Comment: CBD tincture  . Sexual activity: Yes    Birth control/protection: Post-menopausal  Other Topics Concern  . Not on file  Social History Narrative   Lives with husband in a 2 story home.  Has 2 living children.  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  Education: college.    Right-handed.   Five cups coffee per week.   Social Determinants of Health   Financial Resource Strain:   . Difficulty of Paying Living Expenses: Not on file  Food Insecurity:   . Worried About Charity fundraiser in the Last Year: Not on file  . Ran Out of Food in the Last Year: Not on file  Transportation Needs:   . Lack of Transportation (Medical): Not on file  . Lack of Transportation (Non-Medical): Not on file  Physical Activity:   . Days of Exercise per Week: Not on file  . Minutes of Exercise per Session: Not on file  Stress:   . Feeling of Stress : Not on file  Social Connections:   . Frequency of Communication with Friends and Family: Not on file  . Frequency of Social Gatherings with Friends and Family: Not on file  . Attends Religious Services: Not on file  . Active Member of Clubs or Organizations: Not on file  . Attends Archivist Meetings: Not on file  . Marital Status: Not on file   Past Surgical History:  Procedure Laterality Date  . ABDOMINAL HYSTERECTOMY  1985  . BASAL CELL CARCINOMA EXCISION  10/15   nose  . BREAST BIOPSY Right 08/24/2015  . BREAST BIOPSY Right 07/13/2015  . BREAST BIOPSY Left 07/13/2015  . BREAST EXCISIONAL BIOPSY Left 1972  . BREAST LUMPECTOMY Left   . BREAST LUMPECTOMY WITH RADIOACTIVE  SEED AND SENTINEL LYMPH NODE BIOPSY Left 09/16/2015   Procedure: BREAST LUMPECTOMY WITH RADIOACTIVE SEED AND SENTINEL LYMPH NODE BIOPSY;  Surgeon: Stark Klein, MD;  Location: [facility];  Service: General;  Laterality: Left;  . BREAST REDUCTION SURGERY Bilateral 1998  . CATARACT EXTRACTION W/ INTRAOCULAR LENS IMPLANT Left 2016  . [place]   hallo  . COLONOSCOPY  01/2013   polyps  . EYE SURGERY Left 2016  . HAND SURGERY Left 02-19-2002   dr Fredna Dow  @[facility]    repair collateral ligament/ MPJ left thumb  .  HARDWARE REMOVAL N/A 07/24/2019   Procedure: REVISION OF HARDWARE Lumbar two - Sacral one. Extention of Fusion to Lumbar one;  Surgeon: [name], MD;  Location: [facility];  Service: Neurosurgery;  Laterality: N/A;  . HEMORRHOID SURGERY  2008  . HIGH RESOLUTION ANOSCOPY N/A 02/28/2018   Procedure: HIGH RESOLUTION ANOSCOPY WITH BIOPSY;  Surgeon: Leighton Ruff, MD;  Location: [facility];  Service: General;  Laterality: N/A;  . KNEE ARTHROSCOPY Right 2002  . LUMBAR SPINE SURGERY  03-20-2015   dr Kathyrn Sheriff   fusion L4-5, L5-S1  . MOUTH SURGERY  07/14/2018   2 infected teeth removed  . MULTIPLE TOOTH EXTRACTIONS     with bone grafting lower bottom right and left  . REDUCTION MAMMAPLASTY Bilateral   . RIGHT COLECTOMY  09-10-2003   dr Margot Chimes @[facility]    multiple colon polyps (per path tubular adenoma's, hyperplastic , benign appendix, benign two lymph nodes  . ROTATOR CUFF REPAIR Left 04/2016  . TONSILLECTOMY  1969  . TUBAL LIGATION Bilateral 1982   Past Medical History:  Diagnosis Date  . AIN (anal intraepithelial neoplasia) anal canal   . Anemia    with pregnancy  . Anxiety   . Arthritis    knees, lower back, knees  . Chronic constipation   . Chronic diastolic CHF (congestive heart failure) ([place]) 07/05/2019  . CKD (chronic kidney disease)   . Coarse tremors    Essential  . COPD (chronic obstructive pulmonary disease) ([place])    2017  chest CT, history of  . Degenerative lumbar spinal stenosis   . Depression   . Depression 07/05/2019  . Drug dependency ([place])   . Elevated PTHrP level 05/09/2019  . ETOH abuse 07/05/2019  . GERD (gastroesophageal reflux disease)    pt denies  . Gout   . Grade I diastolic dysfunction 41/74/0814   Noted ECHO  . Hemorrhoids   . History of adenomatous polyp of colon   . History of basal cell carcinoma (BCC) excision 2015   nose  . History of sepsis 05/14/2014   post lumbar surgery  . History of shingles    x2  . Hyperlipidemia   . Hypertension   . Hypothyroidism   . Irregular heart beat    history of while undergoing radiation  . Malignant neoplasm of upper-inner quadrant of left breast in female, estrogen receptor positive [facility]) oncologist-  dr Jana Hakim--  per lov in epic no recurrence   dx 07-13-2015  Left breast invasive ductal carcinoma, Stage IA, Grade 1 (TXN0),  09-16-2015  s/p  left breast lumpectomy with sln bx's,  completed radiation 11-18-2015,  started antiestrogen therapy 12-14-2015  . Neuropathy    Left foot and back of left leg  . Obesity (BMI 30-39.9) 07/05/2019  . Personal history of radiation therapy    completed 11-18-2015  left breast  . Pneumonia   . PONV (postoperative nausea and vomiting)   . Prediabetes    diet controlled, no med  . Restless leg syndrome   . Seasonal allergies   . Seizure ([place]) 05/2015   due to sepsis, only one time episode  . Tremor   . Wears partial dentures    lower    BP 130/72 Comment: reported  Pulse 88   Ht 5' 7.5" (1.715 m)   Wt 222 lb (100.7 kg) Comment: last recorded  BMI 34.26 kg/m   Opioid Risk Score:   Fall Risk Score:  `1  Depression screen PHQ 2/9  Depression screen PHQ  2/9 01/23/2020 01/22/2020 12/12/2019 10/29/2019 10/02/2019 08/22/2019 03/18/2019  Decreased Interest 0 0 0 0 0 0 0  Down, Depressed, Hopeless 0 0 0 0 0 0 0  PHQ - 2 Score 0 0 0 0 0 0 0  Altered sleeping - - - - - 3 -  Tired, decreased energy - - - - -  2 -  Change in appetite - - - - - 2 -  Feeling bad or failure about yourself  - - - - - 1 -  Trouble concentrating - - - - - 1 -  Moving slowly or fidgety/restless - - - - - 0 -  Suicidal thoughts - - - - - 0 -  PHQ-9 Score - - - - - 9 -  Difficult doing work/chores - - - - - Not difficult at all -  Some recent data might be hidden    Review of Systems  Constitutional: Negative.   HENT: Negative.   Eyes: Negative.   Respiratory: Negative.   Cardiovascular: Negative.   Gastrointestinal: Negative.   Endocrine: Negative.   Genitourinary: Negative.   Musculoskeletal: Positive for gait problem.  Skin: Negative.   Allergic/Immunologic: Negative.   Neurological: Positive for dizziness, tremors, weakness and numbness.       Tingling   Hematological: Negative.   Psychiatric/Behavioral: Negative.   All other systems reviewed and are negative.      Objective:   Physical Exam Patient was seen via phone.  Assessment & Plan:  Brandi Stephens is a 74 year old female who presents for hospital follow-up after CIR admission post back surgery, complicated by staph infection, now on chronic antibiotics (she is on a daily probiotic- encouraged this to protect her gut). Has had follow-up with neurosurgery.   Diarrhea: This was a huge issue previously and has since improved. I had obtained an XR that showed constipation and recommended an enema. Continue magnesium 1000mg  at night and colace BID. This continues but is improved.   Impaired mobility and ADLs following spinal surgery: -Continue outpatient PT and OT. Progressing well. Increase walking to 10 minutes per day -Recommended exercise bike at home to build back endurance. Continue walking outside as well as strength and gait are improving and she is no longer limited by diarrhea.  Post-operative lower back pain: -Stopped oxycodone- was prescribed 1/2 pill hydrocodone by her her spine surgery.  -Continue to supplement with icing, Tyelnol, and  Ibuprofen.   Peripheral neuropathy: -Discussed Sprint PNS system as an option of pain treatment via neuromodulation. Provided following link for patient to learn more about the system: https://www.sprtherapeutics.com/. She would like to try this. Will target sciatic nerve at the popliteal fossa.  -Increase Amitriptyline to 75mg  HS PRN.  The patient has severe pain that has been shown to be refractory to conventional medical management with physical therapy, a number of pharmacologic options, minimally invasive procedures, and pain coping skills. Function is severely limited due to the pain and ability to perform self-care and activities of daily living is marginal. The patient has responded partially to opiate medications. At this point, having failed most conservative therapies, I consider peripheral nerve stimulation to be medically necessary to reduce his pain, increase his functionality, and in order to avoid opiate dose escalation and the concomitant risks of tolerance, physical dependence, misuse, abuse, addiction, endocrine dysfunction, respiratory depression, and death.   Anixety: Continue Diazepam PRN. Provided refill. Has been taking twice per day and this has been helping her. Also helps with her muscle  spasms. This has been well controlled. She is sometimes upset that she is only comfortably horizontally.   Tachycardia: provided higher dose of metoprolol which she was on before last visit. HR is still slightly elevated today, but BP is low. Maintain current dose. She says BP was elevated at her doctor's appointment yesterday. It fluctuates.   Lower extremity edema: advised elevation, ice, and compression garments which she is already doing. Prescribed prn Lasix. Will avoid Lasix for this reason  Hot flashes: from [place].   Home support: Living with husband. Daughter lives 5 miles away.   All questions answered. RTC in 4 weeks for SPRINT PNS. 21 minutes spent in discussion of SPRINT  PNS, response to Gabapentin in the past, increasing her Amitriptyline, anxiety, low back pain, neuropathic pain, menopausal symptoms

## 2020-01-25 ENCOUNTER — Other Ambulatory Visit: Payer: Self-pay | Admitting: Oncology

## 2020-01-28 ENCOUNTER — Ambulatory Visit: Payer: PPO | Admitting: Internal Medicine

## 2020-01-29 ENCOUNTER — Ambulatory Visit: Payer: PPO | Admitting: Physical Therapy

## 2020-01-31 ENCOUNTER — Other Ambulatory Visit: Payer: PPO

## 2020-01-31 ENCOUNTER — Other Ambulatory Visit: Payer: Self-pay

## 2020-01-31 ENCOUNTER — Encounter: Payer: Self-pay | Admitting: Physical Therapy

## 2020-01-31 ENCOUNTER — Ambulatory Visit: Payer: PPO | Admitting: Physical Therapy

## 2020-01-31 VITALS — BP 133/78 | HR 78

## 2020-01-31 DIAGNOSIS — E038 Other specified hypothyroidism: Secondary | ICD-10-CM | POA: Diagnosis not present

## 2020-01-31 DIAGNOSIS — I700 Atherosclerosis of aorta: Secondary | ICD-10-CM

## 2020-01-31 DIAGNOSIS — Z79899 Other long term (current) drug therapy: Secondary | ICD-10-CM | POA: Diagnosis not present

## 2020-01-31 DIAGNOSIS — R7303 Prediabetes: Secondary | ICD-10-CM

## 2020-01-31 DIAGNOSIS — Z0000 Encounter for general adult medical examination without abnormal findings: Secondary | ICD-10-CM

## 2020-01-31 DIAGNOSIS — M545 Low back pain, unspecified: Secondary | ICD-10-CM | POA: Diagnosis not present

## 2020-01-31 DIAGNOSIS — M6281 Muscle weakness (generalized): Secondary | ICD-10-CM

## 2020-01-31 DIAGNOSIS — I10 Essential (primary) hypertension: Secondary | ICD-10-CM | POA: Diagnosis not present

## 2020-01-31 DIAGNOSIS — R2689 Other abnormalities of gait and mobility: Secondary | ICD-10-CM

## 2020-01-31 DIAGNOSIS — G8929 Other chronic pain: Secondary | ICD-10-CM

## 2020-01-31 NOTE — Therapy (Signed)
[place], [place], [address] Phone: [phone]   Fax:  [phone]  Physical Therapy Treatment/Recertification Progress Note Reporting Period 12/19/2019 to 01/31/2020  See note below for Objective Data and Assessment of Progress/Goals.       Patient Details  Name: Brandi Stephens MRN: [id number] Date of Birth: 1946-03-08 Referring Provider (PT): Leeroy Cha, MD   Encounter Date: 01/31/2020   PT End of Session - 01/31/20 1056    Visit Number 14    Number of Visits 26    Date for PT Re-Evaluation 03/13/20    Authorization Type [place] Medicare, next progress note by visit 24, add KX at visit 15    PT Start Time 1058    PT Stop Time 1140    PT Time Calculation (min) 42 min    Activity Tolerance Patient tolerated treatment well    Behavior During Therapy WFL for tasks assessed/performed           Past Medical History:  Diagnosis Date  . AIN (anal intraepithelial neoplasia) anal canal   . Anemia    with pregnancy  . Anxiety   . Arthritis    knees, lower back, knees  . Chronic constipation   . Chronic diastolic CHF (congestive heart failure) ([place]) 07/05/2019  . CKD (chronic kidney disease)   . Coarse tremors    Essential  . COPD (chronic obstructive pulmonary disease) ([place])    2017 chest CT, history of  . Degenerative lumbar spinal stenosis   . Depression   . Depression 07/05/2019  . Drug dependency ([place])   . Elevated PTHrP level 05/09/2019  . ETOH abuse 07/05/2019  . GERD (gastroesophageal reflux disease)    pt denies  . Gout   . Grade I diastolic dysfunction 36/62/9476   Noted ECHO  . Hemorrhoids   . History of adenomatous polyp of colon   . History of basal cell carcinoma (BCC) excision 2015   nose  . History of sepsis 05/14/2014   post lumbar surgery  . History of shingles    x2  . Hyperlipidemia   . Hypertension   . Hypothyroidism   . Irregular heart beat    history of  while undergoing radiation  . Malignant neoplasm of upper-inner quadrant of left breast in female, estrogen receptor positive [facility]) oncologist-  dr Jana Hakim--  per lov in epic no recurrence   dx 07-13-2015  Left breast invasive ductal carcinoma, Stage IA, Grade 1 (TXN0),  09-16-2015  s/p  left breast lumpectomy with sln bx's,  completed radiation 11-18-2015,  started antiestrogen therapy 12-14-2015  . Neuropathy    Left foot and back of left leg  . Obesity (BMI 30-39.9) 07/05/2019  . Personal history of radiation therapy    completed 11-18-2015  left breast  . Pneumonia   . PONV (postoperative nausea and vomiting)   . Prediabetes    diet controlled, no med  . Restless leg syndrome   . Seasonal allergies   . Seizure ([place]) 05/2015   due to sepsis, only one time episode  . Tremor   . Wears partial dentures    lower     Past Surgical History:  Procedure Laterality Date  . ABDOMINAL HYSTERECTOMY  1985  . BASAL CELL CARCINOMA EXCISION  10/15   nose  . BREAST BIOPSY Right 08/24/2015  . BREAST BIOPSY Right 07/13/2015  . BREAST BIOPSY Left 07/13/2015  . BREAST EXCISIONAL BIOPSY Left 1972  . BREAST LUMPECTOMY Left   .  BREAST LUMPECTOMY WITH RADIOACTIVE SEED AND SENTINEL LYMPH NODE BIOPSY Left 09/16/2015   Procedure: BREAST LUMPECTOMY WITH RADIOACTIVE SEED AND SENTINEL LYMPH NODE BIOPSY;  Surgeon: Stark Klein, MD;  Location: [facility];  Service: General;  Laterality: Left;  . BREAST REDUCTION SURGERY Bilateral 1998  . CATARACT EXTRACTION W/ INTRAOCULAR LENS IMPLANT Left 2016  . [place]   hallo  . COLONOSCOPY  01/2013   polyps  . EYE SURGERY Left 2016  . HAND SURGERY Left 02-19-2002   dr Fredna Dow  '@[email or web address]'    repair collateral ligament/ MPJ left thumb  . HARDWARE REMOVAL N/A 07/24/2019   Procedure: REVISION OF HARDWARE Lumbar two - Sacral one. Extention of Fusion to Lumbar one;  Surgeon: [name], MD;  Location: [facility];  Service: Neurosurgery;   Laterality: N/A;  . HEMORRHOID SURGERY  2008  . HIGH RESOLUTION ANOSCOPY N/A 02/28/2018   Procedure: HIGH RESOLUTION ANOSCOPY WITH BIOPSY;  Surgeon: Leighton Ruff, MD;  Location: [facility];  Service: General;  Laterality: N/A;  . KNEE ARTHROSCOPY Right 2002  . LUMBAR SPINE SURGERY  03-20-2015   dr Kathyrn Sheriff   fusion L4-5, L5-S1  . MOUTH SURGERY  07/14/2018   2 infected teeth removed  . MULTIPLE TOOTH EXTRACTIONS     with bone grafting lower bottom right and left  . REDUCTION MAMMAPLASTY Bilateral   . RIGHT COLECTOMY  09-10-2003   dr Margot Chimes '@[email or web address]'    multiple colon polyps (per path tubular adenoma's, hyperplastic , benign appendix, benign two lymph nodes  . ROTATOR CUFF REPAIR Left 04/2016  . TONSILLECTOMY  1969  . TUBAL LIGATION Bilateral 1982    Vitals:   01/31/20 1102  BP: 133/78  Pulse: 78  SpO2: 97%     Subjective Assessment - 01/31/20 1104    Subjective Pt. presents today for her 14th outpatient therapy session for complications with infection s/p lumbar surgery. She reports she feels like she is getting stronger and back pain has been improving. She still uses RW for community mobility-has been working on using [facility] in therapy sessions and has been weaning off AD use at home. She reports using RW at home about 20% of the time but otherwise does not need AD at home. See assessment/plan.    Pertinent History L4-S1 fusion 03/20/15, L2-3 fusion 05/30/19, breast CA history 2017, seizures (due to sepsis),  lumbar hardware removal 07/24/19 with subsequent staph infection/osteomyelitis, CHF, COPD, chronic pain in pain management, history ETOH abuse, neuropathy, intention tremor    Limitations Sitting;House hold activities;Lifting;Standing;Walking    Currently in Pain? Yes    Pain Score 3     Pain Location Back    Pain Orientation Lower    Pain Descriptors / Indicators Aching    Pain Type Chronic pain    Pain Onset More than a month ago    Pain Frequency Constant     Aggravating Factors  prolonged standing or walking    Pain Relieving Factors ice, medication    Effect of Pain on Daily Activities limits positional and activity tolerance              [facility] PT Assessment - 01/31/20 0001      Strength   Right Hip Flexion 4/5    Right Hip External Rotation  4+/5    Right Hip Internal Rotation 5/5    Left Hip Flexion 4/5    Left Hip External Rotation 4+/5    Left Hip Internal Rotation 4+/5  Right Knee Flexion 5/5    Right Knee Extension 5/5    Left Knee Flexion 5/5    Left Knee Extension 5/5    Right Ankle Dorsiflexion 5/5    Right Ankle Plantar Flexion 4+/5    Right Ankle Inversion 5/5    Right Ankle Eversion 5/5    Left Ankle Dorsiflexion 4/5    Left Ankle Inversion 5/5    Left Ankle Eversion 4/5      Transfers   Five time sit to stand comments  12      Ambulation/Gait   Gait Comments Tinetti Gait + Balance 19/28      Timed Up and Go Test   Normal TUG (seconds) 11   with RW use                        [facility] Adult PT Treatment/Exercise - 01/31/20 0001      Lumbar Exercises: Stretches   Passive Hamstring Stretch Right;Left;3 reps;30 seconds    Passive Hamstring Stretch Limitations also instructed in HEP supine vs. seated hamstring stretch variations    Piriformis Stretch Right;Left;2 reps;30 seconds      Lumbar Exercises: Supine   AB Set Limitations hooklying TA contraction with tactile cueing 3-5 sec holds x 20  reps    Clam 20 reps    Clam Limitations 2x10 with blue band    Bent Knee Raise 15 reps    Bridge with [employer] 15 reps    Bridge with [employer] Limitations small/partial ROM    Isometric Hip Flexion 10 reps;3 seconds    Other Supine Lumbar Exercises abdominal bracing with alt. DB raises 2 lbs. ea. UE x 20 reps                  PT Education - 01/31/20 1152    Education Details HEP, POC    Person(s) Educated Patient    Methods Explanation    Comprehension Verbalized understanding              PT Short Term Goals - 01/31/20 1200      PT SHORT TERM GOAL #1   Title Independent with HEP    Time 3    Period Weeks    Status Achieved      PT SHORT TERM GOAL #2   Title Improve TUG time (with RW) at least 3-5 sec for decreased fall risk    Baseline 11 sec    Time 3    Period Weeks    Status Achieved      PT SHORT TERM GOAL #3   Title Ambulate at least 100 feet CGA with SPC for progression to LTG for gait    Baseline met/able    Time 3    Period Weeks    Status Achieved             PT Long Term Goals - 01/31/20 1202      PT LONG TERM GOAL #1   Title Improve Tinetti Gait + Balance at least 3-5 points for decreased fall risk    Baseline 19/28    Time 6    Period Weeks    Status On-going    Target Date 03/13/20      PT LONG TERM GOAL #2   Title Increase left hip and ankle strength at least grossly 1/2 MMT grade to improve ability to ascend/descend stairs to 2nd floor bedroom    Baseline see objective-partially met  Time 6    Period Weeks    Status Partially Met    Target Date 03/13/20      PT LONG TERM GOAL #3   Title Perform household level ambulation 150 feet or less mod I with SPC vs. LRAD    Baseline reports ambulating without AD 80% of the time at home, progressing    Time 6    Period Weeks    Status On-going    Target Date 03/13/20      PT LONG TERM GOAL #4   Title Improve 5 times sit<>stand time to 14 sec or less for improved ability for transfers from low seats    Baseline 12 sec    Time 6    Period Weeks    Status Achieved      PT LONG TERM GOAL #5   Title Tolerate standing for periods at least 15-20 min for cooking and light chores with LBP 4/10 or less    Baseline ongoing, 3/10 pain pre-tx. but intermittently higher    Time 6    Period Weeks    Status On-going    Target Date 03/13/20                 Plan - 01/31/20 1153    Clinical Impression Statement Patient came directly from appointment for fasting labwork  prior to PT session today so activity tolerance more limited given she was unable to eat today prior to session (more focus mat-based exercises). Functionally pt. has been progressing with decreased need AD at home and working toward ability to use [facility] for community mobility. Status for gait progression (to [facility] use) impacted by deconditioning>balance issues at this point. Tinetti Gait + Balance still limited at 19/28 with continued use AD as limiting factor for score progression but expect score improvement with further gait training progression/weaning off AD use. Improving from previous status with standing tolerance and TUG time goal met for gait speed. She has stairs at home and continues to have difficulty descending>ascending stairs due to LE weakness and pain. Plan continue PT for further progress to improve functional status and safety for mobility.    Personal Factors and Comorbidities Comorbidity 3+;Time since onset of injury/illness/exacerbation;Age    Comorbidities see PMH    Examination-Activity Limitations Sit;Dressing;Bathing;Transfers;Sleep;Hygiene/Grooming;Bed Mobility;Bend;Lift;Squat;Stairs;Locomotion Level;Carry;Stand;Continence;Toileting    Examination-Participation Restrictions Community Activity;Shop;Cleaning;Laundry;Meal Prep    Stability/Clinical Decision Making Evolving/Moderate complexity    Clinical Decision Making Moderate    Rehab Potential Good    PT Frequency 2x / week    PT Duration 6 weeks    PT Treatment/Interventions ADLs/Self Care Home Management;Cryotherapy;Moist Heat;Therapeutic activities;Gait training;Therapeutic exercise;Patient/family education;Manual techniques;Stair training;Functional mobility training;Balance training;Neuromuscular re-education;Taping    PT Next Visit Plan monitor vitals/O2 sats prn, plan consider stair training due to difficulty reported at home with stair navigation and include step ups/step dows with strengthening, continue gait with SPC,  core/back and LE strengthening and conditioning    PT Home Exercise Plan Access Code: [email or web address]: [email or web address]: 08/31/2021Prepared by: Caesar Bookman Squat with Counter Support - 2 x daily - 7 x weekly - 2 sets - 10 repsStanding March with Counter Support - 2 x daily - 7 x weekly - 3 sets - 10 repsStanding Heel Raise with Support - 2 x daily - 7 x weekly - 3 sets - 10 repsStanding Hip Abduction with Counter Support - 2 x daily - 7 x weekly - 3 sets - 10 reps    Consulted and Agree with Plan of Care Patient  Patient will benefit from skilled therapeutic intervention in order to improve the following deficits and impairments:  Abnormal gait, Decreased balance, Decreased endurance, Difficulty walking, Impaired sensation, Pain, Impaired flexibility, Decreased strength, Decreased activity tolerance  Visit Diagnosis: Chronic bilateral low back pain, unspecified whether sciatica present  Muscle weakness (generalized)  Other abnormalities of gait and mobility     Problem List Patient Active Problem List   Diagnosis Date Noted  . Osteopenia 01/22/2020  . Aortic atherosclerosis ([place]) 01/22/2020  . Wheezing 01/22/2020  . Diarrhea 01/22/2020  . Insomnia 10/29/2019  . Recurrent falls 09/06/2019  . Confusion, postoperative   . Benign essential HTN   . Post-operative pain   . Chronic constipation   . 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 Post op infection 07/11/2019  . Muscle spasm   . Hypokalemia   . Status post lumbar spinal fusion 07/10/2019  . Postoperative pain   . Acute blood loss anemia   . Labile blood pressure   . Tobacco abuse   . Hypoalbuminemia due to protein-calorie malnutrition ([place])   . Anxiety 07/05/2019  . Depression 07/05/2019  . Chronic diastolic CHF (congestive heart failure) ([place]) 07/05/2019  . Obesity (BMI 30-39.9) 07/05/2019  . ETOH abuse 07/05/2019  . Lumbar radiculopathy 06/28/2019  . Spinal stenosis of  lumbar region 06/28/2019  . Acute on chronic anemia   . Tremors of nervous system   . AKI (acute kidney injury) ([place])   . Chronic pain syndrome   . Acute renal failure superimposed on stage 3 chronic kidney disease ([place]) 06/25/2019  . Acute osteomyelitis of lumbar spine ([place]) 06/25/2019  . Weakness of left lower extremity   . Abdominal pain 06/24/2019  . Nausea 06/24/2019  . Abnormal urine odor 06/24/2019  . History of sepsis 06/24/2019  . Recent major surgery 06/24/2019  . History of diverticulosis 06/24/2019  . Elevated PTHrP level 05/09/2019  . Paresthesia 02/12/2019  . Low back pain 02/12/2019  . Neuropathy 12/20/2018  . Lumbar spinal stenosis 06/29/2018  . Preoperative clearance 06/04/2018  . Abnormal CT of the chest 06/04/2018  . Smoker 05/17/2018  . Hypothyroidism 11/30/2017  . Thoracic aortic atherosclerosis ([place]) 09/14/2016  . Benign essential tremor 09/09/2016  . RLS (restless legs syndrome) 09/09/2016  . H/O adenomatous polyp of colon 07/06/2016  . PVC's (premature ventricular contractions) 03/29/2016  . Preoperative cardiovascular examination 03/29/2016  . Grade I diastolic dysfunction 70/62/3762  . Palpitations 01/11/2016  . Shortness of breath 01/11/2016  . Former cigarette smoker 01/11/2016  . Leg edema 01/11/2016  . Chronic cough 10/28/2015  . 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 Malignant neoplasm of upper-inner quadrant of left breast in female, estrogen receptor positive ([place]) 07/14/2015  . Prediabetes 07/08/2015  . CKD (chronic kidney disease), stage III 07/08/2015  . Dependent edema 06/08/2015  . Sleep disturbance 06/08/2015  . Mixed hyperlipidemia 06/08/2015  . Generalized anxiety disorder 06/08/2015  . Essential hypertension 06/08/2015  . COPD (chronic obstructive pulmonary disease) ([place]) 06/08/2015  . Gout 06/08/2015  . Macrocytic anemia   . 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 Sepsis ([place]) 05/15/2015  . Lumbar spondylosis 03/20/2015     Beaulah Dinning, PT, DPT 01/31/20 12:12 PM  [facility] [address] [place], [place], [address] Phone: [phone]   Fax:  [phone]  Name: Brandi Stephens MRN: [id number] Date of Birth: 09/16/1945

## 2020-02-01 LAB — CBC WITH DIFFERENTIAL/PLATELET
Basophils Absolute: 0.1 10*3/uL (ref 0.0–0.2)
Basos: 1 %
EOS (ABSOLUTE): 0.3 10*3/uL (ref 0.0–0.4)
Eos: 4 %
Hematocrit: 37.5 % (ref 34.0–46.6)
Hemoglobin: 12.4 g/dL (ref 11.1–15.9)
Immature Grans (Abs): 0.2 10*3/uL — ABNORMAL HIGH (ref 0.0–0.1)
Immature Granulocytes: 2 %
Lymphocytes Absolute: 2 10*3/uL (ref 0.7–3.1)
Lymphs: 23 %
MCH: 31.6 pg (ref 26.6–33.0)
MCHC: 33.1 g/dL (ref 31.5–35.7)
MCV: 96 fL (ref 79–97)
Monocytes Absolute: 0.7 10*3/uL (ref 0.1–0.9)
Monocytes: 8 %
Neutrophils Absolute: 5.3 10*3/uL (ref 1.4–7.0)
Neutrophils: 62 %
Platelets: 254 10*3/uL (ref 150–450)
RBC: 3.92 x10E6/uL (ref 3.77–5.28)
RDW: 14.5 % (ref 11.7–15.4)
WBC: 8.6 10*3/uL (ref 3.4–10.8)

## 2020-02-01 LAB — TSH: TSH: 3.01 u[IU]/mL (ref 0.450–4.500)

## 2020-02-01 LAB — COMPREHENSIVE METABOLIC PANEL
ALT: 17 IU/L (ref 0–32)
AST: 12 IU/L (ref 0–40)
Albumin/Globulin Ratio: 2 (ref 1.2–2.2)
Albumin: 4.3 g/dL (ref 3.7–4.7)
Alkaline Phosphatase: 129 IU/L — ABNORMAL HIGH (ref 44–121)
BUN/Creatinine Ratio: 24 (ref 12–28)
BUN: 26 mg/dL (ref 8–27)
Bilirubin Total: 0.3 mg/dL (ref 0.0–1.2)
CO2: 24 mmol/L (ref 20–29)
Calcium: 9.7 mg/dL (ref 8.7–10.3)
Chloride: 99 mmol/L (ref 96–106)
Creatinine, Ser: 1.1 mg/dL — ABNORMAL HIGH (ref 0.57–1.00)
GFR calc Af Amer: 58 mL/min/{1.73_m2} — ABNORMAL LOW (ref >59)
GFR calc non Af Amer: 50 mL/min/{1.73_m2} — ABNORMAL LOW (ref >59)
Globulin, Total: 2.1 g/dL (ref 1.5–4.5)
Glucose: 122 mg/dL — ABNORMAL HIGH (ref 65–99)
Potassium: 4.7 mmol/L (ref 3.5–5.2)
Sodium: 139 mmol/L (ref 134–144)
Total Protein: 6.4 g/dL (ref 6.0–8.5)

## 2020-02-01 LAB — LIPID PANEL
Chol/HDL Ratio: 3.9 ratio (ref 0.0–4.4)
Cholesterol, Total: 208 mg/dL — ABNORMAL HIGH (ref 100–199)
HDL: 53 mg/dL (ref >39)
LDL Chol Calc (NIH): 95 mg/dL (ref 0–99)
Triglycerides: 362 mg/dL — ABNORMAL HIGH (ref 0–149)
VLDL Cholesterol Cal: 60 mg/dL — ABNORMAL HIGH (ref 5–40)

## 2020-02-01 LAB — VITAMIN D 25 HYDROXY (VIT D DEFICIENCY, FRACTURES): Vit D, 25-Hydroxy: 32.9 ng/mL (ref 30.0–100.0)

## 2020-02-01 LAB — HEMOGLOBIN A1C
Est. average glucose Bld gHb Est-mCnc: 123 mg/dL
Hgb A1c MFr Bld: 5.9 % — ABNORMAL HIGH (ref 4.8–5.6)

## 2020-02-01 LAB — T4, FREE: Free T4: 1.24 ng/dL (ref 0.82–1.77)

## 2020-02-03 ENCOUNTER — Ambulatory Visit: Payer: PPO | Admitting: Physical Therapy

## 2020-02-03 ENCOUNTER — Other Ambulatory Visit: Payer: Self-pay

## 2020-02-03 DIAGNOSIS — M545 Low back pain, unspecified: Secondary | ICD-10-CM | POA: Diagnosis not present

## 2020-02-03 DIAGNOSIS — T8140XA Infection following a procedure, unspecified, initial encounter: Secondary | ICD-10-CM

## 2020-02-03 DIAGNOSIS — M5416 Radiculopathy, lumbar region: Secondary | ICD-10-CM

## 2020-02-03 DIAGNOSIS — M6281 Muscle weakness (generalized): Secondary | ICD-10-CM

## 2020-02-03 DIAGNOSIS — M4620 Osteomyelitis of vertebra, site unspecified: Secondary | ICD-10-CM

## 2020-02-03 DIAGNOSIS — G8929 Other chronic pain: Secondary | ICD-10-CM

## 2020-02-03 DIAGNOSIS — R2689 Other abnormalities of gait and mobility: Secondary | ICD-10-CM

## 2020-02-03 NOTE — Therapy (Signed)
[place], [place], [address] Phone: [phone]   Fax:  [phone]  Physical Therapy Treatment  Patient Details  Name: LEE-ANN GAL MRN: [id number] Date of Birth: 16-May-1945 Referring Provider (PT): Leeroy Cha, MD   Encounter Date: 02/03/2020   PT End of Session - 02/03/20 1326    Visit Number 15    Number of Visits 26    Date for PT Re-Evaluation 03/13/20    Authorization Type [place] Medicare, next progress note by visit 24, add KX at visit 15    PT Start Time 1320    PT Stop Time 1400    PT Time Calculation (min) 40 min           Past Medical History:  Diagnosis Date  . AIN (anal intraepithelial neoplasia) anal canal   . Anemia    with pregnancy  . Anxiety   . Arthritis    knees, lower back, knees  . Chronic constipation   . Chronic diastolic CHF (congestive heart failure) ([place]) 07/05/2019  . CKD (chronic kidney disease)   . Coarse tremors    Essential  . COPD (chronic obstructive pulmonary disease) ([place])    2017 chest CT, history of  . Degenerative lumbar spinal stenosis   . Depression   . Depression 07/05/2019  . Drug dependency ([place])   . Elevated PTHrP level 05/09/2019  . ETOH abuse 07/05/2019  . GERD (gastroesophageal reflux disease)    pt denies  . Gout   . Grade I diastolic dysfunction 03/47/4259   Noted ECHO  . Hemorrhoids   . History of adenomatous polyp of colon   . History of basal cell carcinoma (BCC) excision 2015   nose  . History of sepsis 05/14/2014   post lumbar surgery  . History of shingles    x2  . Hyperlipidemia   . Hypertension   . Hypothyroidism   . Irregular heart beat    history of while undergoing radiation  . Malignant neoplasm of upper-inner quadrant of left breast in female, estrogen receptor positive [facility]) oncologist-  dr Jana Hakim--  per lov in epic no recurrence   dx 07-13-2015  Left breast invasive ductal carcinoma, Stage IA, Grade 1 (TXN0),   09-16-2015  s/p  left breast lumpectomy with sln bx's,  completed radiation 11-18-2015,  started antiestrogen therapy 12-14-2015  . Neuropathy    Left foot and back of left leg  . Obesity (BMI 30-39.9) 07/05/2019  . Personal history of radiation therapy    completed 11-18-2015  left breast  . Pneumonia   . PONV (postoperative nausea and vomiting)   . Prediabetes    diet controlled, no med  . Restless leg syndrome   . Seasonal allergies   . Seizure ([place]) 05/2015   due to sepsis, only one time episode  . Tremor   . Wears partial dentures    lower     Past Surgical History:  Procedure Laterality Date  . ABDOMINAL HYSTERECTOMY  1985  . BASAL CELL CARCINOMA EXCISION  10/15   nose  . BREAST BIOPSY Right 08/24/2015  . BREAST BIOPSY Right 07/13/2015  . BREAST BIOPSY Left 07/13/2015  . BREAST EXCISIONAL BIOPSY Left 1972  . BREAST LUMPECTOMY Left   . BREAST LUMPECTOMY WITH RADIOACTIVE SEED AND SENTINEL LYMPH NODE BIOPSY Left 09/16/2015   Procedure: BREAST LUMPECTOMY WITH RADIOACTIVE SEED AND SENTINEL LYMPH NODE BIOPSY;  Surgeon: Stark Klein, MD;  Location: [place];  Service: General;  Laterality: Left;  . BREAST REDUCTION SURGERY Bilateral 1998  . CATARACT EXTRACTION W/ INTRAOCULAR LENS IMPLANT Left 2016  . [place]   hallo  . COLONOSCOPY  01/2013   polyps  . EYE SURGERY Left 2016  . HAND SURGERY Left 02-19-2002   dr Fredna Dow  _0    repair collateral ligament/ MPJ left thumb  . HARDWARE REMOVAL N/A 07/24/2019   Procedure: REVISION OF HARDWARE Lumbar two - Sacral one. Extention of Fusion to Lumbar one;  Surgeon: [name], MD;  Location: [place];  Service: Neurosurgery;  Laterality: N/A;  . HEMORRHOID SURGERY  2008  . HIGH RESOLUTION ANOSCOPY N/A 02/28/2018   Procedure: HIGH RESOLUTION ANOSCOPY WITH BIOPSY;  Surgeon: Leighton Ruff, MD;  Location: [facility];  Service: General;  Laterality: N/A;  . KNEE ARTHROSCOPY Right 2002   . LUMBAR SPINE SURGERY  03-20-2015   dr Kathyrn Sheriff   fusion L4-5, L5-S1  . MOUTH SURGERY  07/14/2018   2 infected teeth removed  . MULTIPLE TOOTH EXTRACTIONS     with bone grafting lower bottom right and left  . REDUCTION MAMMAPLASTY Bilateral   . RIGHT COLECTOMY  09-10-2003   dr Margot Chimes _1    multiple colon polyps (per path tubular adenoma's, hyperplastic , benign appendix, benign two lymph nodes  . ROTATOR CUFF REPAIR Left 04/2016  . TONSILLECTOMY  1969  . TUBAL LIGATION Bilateral 1982    There were no vitals filed for this visit.   Subjective Assessment - 02/03/20 1325    Subjective Strong pain at right side low back. They are going to do the stimulator.    Pertinent History L4-S1 fusion 03/20/15, L2-3 fusion 05/30/19, breast CA history 2017, seizures (due to sepsis),  lumbar hardware removal 07/24/19 with subsequent staph infection/osteomyelitis, CHF, COPD, chronic pain in pain management, history ETOH abuse, neuropathy, intention tremor    Currently in Pain? Yes    Pain Score 5     Pain Location Back    Pain Orientation Lower    Pain Descriptors / Indicators Aching    Pain Type Chronic pain                             [facility] Adult PT Treatment/Exercise - 02/03/20 0001      Neuro Re-ed    Neuro Re-ed Details  narrow stance with head turns       Lumbar Exercises: Standing   Row AROM;Strengthening;15 reps    Theraband Level (Row) Level 4 (Blue)    Shoulder Extension AROM;Strengthening;Both;15 reps    Theraband Level (Shoulder Extension) Level 4 (Blue)    Other Standing Lumbar Exercises 6 inch step up forward and lateral x 10 each bilat , 6 inch lateral step up x 10 each       Lumbar Exercises: Seated   Sit to Stand 15 reps    Sit to Stand Limitations bariatric chair, no UE, cues to control descent and increase hip hinge.                    PT Short Term Goals - 01/31/20 1200      PT SHORT TERM GOAL #1   Title Independent with HEP    Time 3     Period Weeks    Status Achieved      PT SHORT TERM GOAL #2   Title Improve TUG time (with RW) at least 3-5 sec for decreased fall  risk    Baseline 11 sec    Time 3    Period Weeks    Status Achieved      PT SHORT TERM GOAL #3   Title Ambulate at least 100 feet CGA with SPC for progression to LTG for gait    Baseline met/able    Time 3    Period Weeks    Status Achieved             PT Long Term Goals - 01/31/20 1202      PT LONG TERM GOAL #1   Title Improve [place] Gait + Balance at least 3-5 points for decreased fall risk    Baseline 19/28    Time 6    Period Weeks    Status On-going    Target Date 03/13/20      PT LONG TERM GOAL #2   Title Increase left hip and ankle strength at least grossly 1/2 MMT grade to improve ability to ascend/descend stairs to 2nd floor bedroom    Baseline see objective-partially met    Time 6    Period Weeks    Status Partially Met    Target Date 03/13/20      PT LONG TERM GOAL #3   Title Perform household level ambulation 150 feet or less mod I with SPC vs. LRAD    Baseline reports ambulating without AD 80% of the time at home, progressing    Time 6    Period Weeks    Status On-going    Target Date 03/13/20      PT LONG TERM GOAL #4   Title Improve 5 times sit<>stand time to 14 sec or less for improved ability for transfers from low seats    Baseline 12 sec    Time 6    Period Weeks    Status Achieved      PT LONG TERM GOAL #5   Title Tolerate standing for periods at least 15-20 min for cooking and light chores with LBP 4/10 or less    Baseline ongoing, 3/10 pain pre-tx. but intermittently higher    Time 6    Period Weeks    Status On-going    Target Date 03/13/20                 Plan - 02/03/20 1349    Clinical Impression Statement Pt reports increased pain for 2 days after every visit that requires pelvic tilt/pressing back into mat. Continued with functional strength and balance. Performed standing and seated  core strengthening. Frequent rest breaks given for SOB. SP02 monitored throughout session to remain </= 90%.    Examination-Activity Limitations Sit;Dressing;Bathing;Transfers;Sleep;Hygiene/Grooming;Bed Mobility;Bend;Lift;Squat;Stairs;Locomotion Level;Carry;Stand;Continence;Toileting    PT Next Visit Plan monitor vitals/O2 sats prn, plan consider stair training due to difficulty reported at home with stair navigation and include step ups/step dows with strengthening, continue gait with SPC, core/back and LE strengthening and conditioning    PT Home Exercise Plan Access Code: [email or web address]: [email or web address]: 08/31/2021Prepared by: Caesar Bookman Squat with Counter Support - 2 x daily - 7 x weekly - 2 sets - 10 repsStanding March with Counter Support - 2 x daily - 7 x weekly - 3 sets - 10 repsStanding Heel Raise with Support - 2 x daily - 7 x weekly - 3 sets - 10 repsStanding Hip Abduction with Counter Support - 2 x daily - 7 x weekly - 3 sets - 10 reps           Patient  will benefit from skilled therapeutic intervention in order to improve the following deficits and impairments:  Abnormal gait, Decreased balance, Decreased endurance, Difficulty walking, Impaired sensation, Pain, Impaired flexibility, Decreased strength, Decreased activity tolerance  Visit Diagnosis: Chronic bilateral low back pain, unspecified whether sciatica present  Muscle weakness (generalized)  Other abnormalities of gait and mobility  Lumbar radiculopathy  Postoperative infection, unspecified type, initial encounter  Paraspinal abscess [facility])     Problem List Patient Active Problem List   Diagnosis Date Noted  . Osteopenia 01/22/2020  . Aortic atherosclerosis ([place]) 01/22/2020  . Wheezing 01/22/2020  . Diarrhea 01/22/2020  . Insomnia 10/29/2019  . Recurrent falls 09/06/2019  . Confusion, postoperative   . Benign essential HTN   . Post-operative pain   . Chronic constipation    . 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 Post op infection 07/11/2019  . Muscle spasm   . Hypokalemia   . Status post lumbar spinal fusion 07/10/2019  . Postoperative pain   . Acute blood loss anemia   . Labile blood pressure   . Tobacco abuse   . Hypoalbuminemia due to protein-calorie malnutrition ([place])   . Anxiety 07/05/2019  . Depression 07/05/2019  . Chronic diastolic CHF (congestive heart failure) ([place]) 07/05/2019  . Obesity (BMI 30-39.9) 07/05/2019  . ETOH abuse 07/05/2019  . Lumbar radiculopathy 06/28/2019  . Spinal stenosis of lumbar region 06/28/2019  . Acute on chronic anemia   . Tremors of nervous system   . AKI (acute kidney injury) ([place])   . Chronic pain syndrome   . Acute renal failure superimposed on stage 3 chronic kidney disease ([place]) 06/25/2019  . Acute osteomyelitis of lumbar spine ([place]) 06/25/2019  . Weakness of left lower extremity   . Abdominal pain 06/24/2019  . Nausea 06/24/2019  . Abnormal urine odor 06/24/2019  . History of sepsis 06/24/2019  . Recent major surgery 06/24/2019  . History of diverticulosis 06/24/2019  . Elevated PTHrP level 05/09/2019  . Paresthesia 02/12/2019  . Low back pain 02/12/2019  . Neuropathy 12/20/2018  . Lumbar spinal stenosis 06/29/2018  . Preoperative clearance 06/04/2018  . Abnormal CT of the chest 06/04/2018  . Smoker 05/17/2018  . Hypothyroidism 11/30/2017  . Thoracic aortic atherosclerosis ([place]) 09/14/2016  . Benign essential tremor 09/09/2016  . RLS (restless legs syndrome) 09/09/2016  . H/O adenomatous polyp of colon 07/06/2016  . PVC's (premature ventricular contractions) 03/29/2016  . Preoperative cardiovascular examination 03/29/2016  . Grade I diastolic dysfunction 95/28/4132  . Palpitations 01/11/2016  . Shortness of breath 01/11/2016  . Former cigarette smoker 01/11/2016  . Leg edema 01/11/2016  . Chronic cough 10/28/2015  . 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 Malignant neoplasm of upper-inner  quadrant of left breast in female, estrogen receptor positive ([place]) 07/14/2015  . Prediabetes 07/08/2015  . CKD (chronic kidney disease), stage III 07/08/2015  . Dependent edema 06/08/2015  . Sleep disturbance 06/08/2015  . Mixed hyperlipidemia 06/08/2015  . Generalized anxiety disorder 06/08/2015  . Essential hypertension 06/08/2015  . COPD (chronic obstructive pulmonary disease) ([place]) 06/08/2015  . Gout 06/08/2015  . Macrocytic anemia   . 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 Sepsis ([place]) 05/15/2015  . Lumbar spondylosis 03/20/2015    Dorene Ar, PTA 02/03/2020, 2:12 PM  [facility] [address] [place], [place], [address] Phone: [phone]   Fax:  [phone]  Name: YULEIMY KRETZ MRN: [id number] Date of Birth: 1946-03-14

## 2020-02-06 ENCOUNTER — Ambulatory Visit: Payer: PPO | Admitting: Physical Therapy

## 2020-02-06 ENCOUNTER — Encounter: Payer: Self-pay | Admitting: Physical Therapy

## 2020-02-06 ENCOUNTER — Other Ambulatory Visit: Payer: Self-pay

## 2020-02-06 DIAGNOSIS — M545 Low back pain, unspecified: Secondary | ICD-10-CM | POA: Diagnosis not present

## 2020-02-06 DIAGNOSIS — G8929 Other chronic pain: Secondary | ICD-10-CM

## 2020-02-06 DIAGNOSIS — R2689 Other abnormalities of gait and mobility: Secondary | ICD-10-CM

## 2020-02-06 DIAGNOSIS — M6281 Muscle weakness (generalized): Secondary | ICD-10-CM

## 2020-02-06 NOTE — Therapy (Signed)
[facility] [place], [place], [address] Phone: [phone]   Fax:  [phone]  Physical Therapy Treatment  Patient Details  Name: Brandi Stephens MRN: [id number] Date of Birth: [birth year] Referring Provider (PT): Leeroy Cha, MD   Encounter Date: 02/06/2020   PT End of Session - 02/06/20 1340    Visit Number 16    Number of Visits 26    Date for PT Re-Evaluation 03/13/20    Authorization Type [employer] Medicare, next progress note by visit 24, add KX at visit 15    PT Start Time 1333    PT Stop Time 1414    PT Time Calculation (min) 41 min    Equipment Utilized During Treatment Gait belt    Activity Tolerance Patient limited by fatigue    Behavior During Therapy [facility] for tasks assessed/performed           Past Medical History:  Diagnosis Date  . AIN (anal intraepithelial neoplasia) anal canal   . Anemia    with pregnancy  . Anxiety   . Arthritis    knees, lower back, knees  . Chronic constipation   . Chronic diastolic CHF (congestive heart failure) ([place]) 07/05/2019  . CKD (chronic kidney disease)   . Coarse tremors    Essential  . COPD (chronic obstructive pulmonary disease) ([place])    2017 chest CT, history of  . Degenerative lumbar spinal stenosis   . Depression   . Depression 07/05/2019  . Drug dependency ([place])   . Elevated PTHrP level 05/09/2019  . ETOH abuse 07/05/2019  . GERD (gastroesophageal reflux disease)    pt denies  . Gout   . Grade I diastolic dysfunction 53/29/9242   Noted ECHO  . Hemorrhoids   . History of adenomatous polyp of colon   . History of basal cell carcinoma (BCC) excision 2015   nose  . History of sepsis 05/14/2014   post lumbar surgery  . History of shingles    x2  . Hyperlipidemia   . Hypertension   . Hypothyroidism   . Irregular heart beat    history of while undergoing radiation  . Malignant neoplasm of upper-inner quadrant of left breast in female, estrogen  receptor positive [facility]) oncologist-  dr Jana Hakim--  per lov in epic no recurrence   dx 07-13-2015  Left breast invasive ductal carcinoma, Stage IA, Grade 1 (TXN0),  09-16-2015  s/p  left breast lumpectomy with sln bx's,  completed radiation 11-18-2015,  started antiestrogen therapy 12-14-2015  . Neuropathy    Left foot and back of left leg  . Obesity (BMI 30-39.9) 07/05/2019  . Personal history of radiation therapy    completed 11-18-2015  left breast  . Pneumonia   . PONV (postoperative nausea and vomiting)   . Prediabetes    diet controlled, no med  . Restless leg syndrome   . Seasonal allergies   . Seizure ([place]) 05/2015   due to sepsis, only one time episode  . Tremor   . Wears partial dentures    lower     Past Surgical History:  Procedure Laterality Date  . ABDOMINAL HYSTERECTOMY  1985  . BASAL CELL CARCINOMA EXCISION  10/15   nose  . BREAST BIOPSY Right 08/24/2015  . BREAST BIOPSY Right 07/13/2015  . BREAST BIOPSY Left 07/13/2015  . BREAST EXCISIONAL BIOPSY Left 1972  . BREAST LUMPECTOMY Left   . BREAST LUMPECTOMY WITH RADIOACTIVE SEED AND SENTINEL LYMPH NODE BIOPSY Left 09/16/2015  Procedure: BREAST LUMPECTOMY WITH RADIOACTIVE SEED AND SENTINEL LYMPH NODE BIOPSY;  Surgeon: Stark Klein, MD;  Location: [facility];  Service: General;  Laterality: Left;  . BREAST REDUCTION SURGERY Bilateral 1998  . CATARACT EXTRACTION W/ INTRAOCULAR LENS IMPLANT Left 2016  . [place]   hallo  . COLONOSCOPY  01/2013   polyps  . EYE SURGERY Left 2016  . HAND SURGERY Left 02-19-2002   dr Fredna Dow  '@[email or web address]'    repair collateral ligament/ MPJ left thumb  . HARDWARE REMOVAL N/A 07/24/2019   Procedure: REVISION OF HARDWARE Lumbar two - Sacral one. Extention of Fusion to Lumbar one;  Surgeon: [name], MD;  Location: [facility];  Service: Neurosurgery;  Laterality: N/A;  . HEMORRHOID SURGERY  2008  . HIGH RESOLUTION ANOSCOPY N/A 02/28/2018   Procedure: HIGH  RESOLUTION ANOSCOPY WITH BIOPSY;  Surgeon: Leighton Ruff, MD;  Location: [facility];  Service: General;  Laterality: N/A;  . KNEE ARTHROSCOPY Right 2002  . LUMBAR SPINE SURGERY  03-20-2015   dr Kathyrn Sheriff   fusion L4-5, L5-S1  . MOUTH SURGERY  07/14/2018   2 infected teeth removed  . MULTIPLE TOOTH EXTRACTIONS     with bone grafting lower bottom right and left  . REDUCTION MAMMAPLASTY Bilateral   . RIGHT COLECTOMY  09-10-2003   dr Margot Chimes '@[email or web address]'    multiple colon polyps (per path tubular adenoma's, hyperplastic , benign appendix, benign two lymph nodes  . ROTATOR CUFF REPAIR Left 04/2016  . TONSILLECTOMY  1969  . TUBAL LIGATION Bilateral 1982    Vitals:   02/06/20 1334  SpO2: 95%     Subjective Assessment - 02/06/20 1334    Subjective Still noting some increased pain on right side of low back and also noting bilateral knee pain (has knee OA history). She reports checked O2 saturation this AM and was >90%    Pertinent History L4-S1 fusion 03/20/15, L2-3 fusion 05/30/19, breast CA history 2017, seizures (due to sepsis),  lumbar hardware removal 07/24/19 with subsequent staph infection/osteomyelitis, CHF, COPD, chronic pain in pain management, history ETOH abuse, neuropathy, intention tremor    Currently in Pain? Yes    Pain Score 6     Pain Location Back    Pain Orientation Right;Lower    Pain Descriptors / Indicators Stabbing;Aching    Pain Type Chronic pain    Pain Onset More than a month ago    Pain Frequency Constant    Aggravating Factors  prolonged standing and walking    Pain Relieving Factors ice and medication    Effect of Pain on Daily Activities limits positional and activity tolerance              [facility] PT Assessment - 02/06/20 0001      Ambulation/Gait   Ambulation Distance (Feet) 170 Feet   SBA with intermittent CGA   Assistive device Straight cane    Gait Pattern Step-through pattern    Gait Comments Min cues for left heel strink with achieving  inceased knee extension in terminal swing phase                         [facility] Adult PT Treatment/Exercise - 02/06/20 0001      Exercises   Exercises Knee/Hip      Lumbar Exercises: Aerobic   Nustep L6 x 5 min UE/LE      Lumbar Exercises: Seated   Sit to Stand --  see under seated knee exercises     Lumbar Exercises: Supine   Pelvic Tilt 15 reps    Clam 15 reps    Clam Limitations green band    Bent Knee Raise 15 reps    Bridge with Ball Squeeze 15 reps    Other Supine Lumbar Exercises abd. bracing with alt. UE raises holding 2 lb. DB ea. UE      Knee/Hip Exercises: Standing   Lateral Step Up Right;Left;1 set;10 reps    Forward Step Up Right;Left;15 reps;Hand Hold: 2;Step Height: 6"    Forward Step Up Limitations seated rest after the first 10 reps       Knee/Hip Exercises: Seated   Sit to Sand 15 reps;without UE support                    PT Short Term Goals - 01/31/20 1200      PT SHORT TERM GOAL #1   Title Independent with HEP    Time 3    Period Weeks    Status Achieved      PT SHORT TERM GOAL #2   Title Improve TUG time (with RW) at least 3-5 sec for decreased fall risk    Baseline 11 sec    Time 3    Period Weeks    Status Achieved      PT SHORT TERM GOAL #3   Title Ambulate at least 100 feet CGA with SPC for progression to LTG for gait    Baseline met/able    Time 3    Period Weeks    Status Achieved             PT Long Term Goals - 01/31/20 1202      PT LONG TERM GOAL #1   Title Improve [place] Gait + Balance at least 3-5 points for decreased fall risk    Baseline 19/28    Time 6    Period Weeks    Status On-going    Target Date 03/13/20      PT LONG TERM GOAL #2   Title Increase left hip and ankle strength at least grossly 1/2 MMT grade to improve ability to ascend/descend stairs to 2nd floor bedroom    Baseline see objective-partially met    Time 6    Period Weeks    Status Partially Met    Target Date  03/13/20      PT LONG TERM GOAL #3   Title Perform household level ambulation 150 feet or less mod I with SPC vs. LRAD    Baseline reports ambulating without AD 80% of the time at home, progressing    Time 6    Period Weeks    Status On-going    Target Date 03/13/20      PT LONG TERM GOAL #4   Title Improve 5 times sit<>stand time to 14 sec or less for improved ability for transfers from low seats    Baseline 12 sec    Time 6    Period Weeks    Status Achieved      PT LONG TERM GOAL #5   Title Tolerate standing for periods at least 15-20 min for cooking and light chores with LBP 4/10 or less    Baseline ongoing, 3/10 pain pre-tx. but intermittently higher    Time 6    Period Weeks    Status On-going    Target Date 03/13/20  Plan - 02/06/20 1350    Clinical Impression Statement Initial O2 saturation 88% after amublation from waiting room but increased to 95% with brief seated rest prior to initiating any exercises. O2 decreased to 84% after gait training but increased to 91% with brief seated rest, otherwise maintained >90%. Pain and fatigue with decreased O2 saturation were limiting factors in exercise tolerance today and ability for therapy progression-will continue to monitor and progress as appropriate.    Personal Factors and Comorbidities Comorbidity 3+;Time since onset of injury/illness/exacerbation;Age    Comorbidities see PMH    Examination-Activity Limitations Sit;Dressing;Bathing;Transfers;Sleep;Hygiene/Grooming;Bed Mobility;Bend;Lift;Squat;Stairs;Locomotion Level;Carry;Stand;Continence;Toileting    Examination-Participation Restrictions Community Activity;Shop;Cleaning;Laundry;Meal Prep    Stability/Clinical Decision Making Evolving/Moderate complexity    Clinical Decision Making Moderate    Rehab Potential Good    PT Frequency 2x / week    PT Duration 6 weeks    PT Treatment/Interventions ADLs/Self Care Home Management;Cryotherapy;Moist  Heat;Therapeutic activities;Gait training;Therapeutic exercise;Patient/family education;Manual techniques;Stair training;Functional mobility training;Balance training;Neuromuscular re-education;Taping    PT Next Visit Plan continue to monitor vitals as needed, pending pain and fatigue continue exercise progression with step up variations, closed chain strengthening for LE, balance, core/back strengthening    PT Home Exercise Plan Access Code: [email or web address]: [email or web address]: 08/31/2021Prepared by: Caesar Bookman Squat with Counter Support - 2 x daily - 7 x weekly - 2 sets - 10 repsStanding March with Counter Support - 2 x daily - 7 x weekly - 3 sets - 10 repsStanding Heel Raise with Support - 2 x daily - 7 x weekly - 3 sets - 10 repsStanding Hip Abduction with Counter Support - 2 x daily - 7 x weekly - 3 sets - 10 reps    Consulted and Agree with Plan of Care Patient           Patient will benefit from skilled therapeutic intervention in order to improve the following deficits and impairments:  Abnormal gait, Decreased balance, Decreased endurance, Difficulty walking, Impaired sensation, Pain, Impaired flexibility, Decreased strength, Decreased activity tolerance  Visit Diagnosis: Chronic bilateral low back pain, unspecified whether sciatica present  Muscle weakness (generalized)  Other abnormalities of gait and mobility     Problem List Patient Active Problem List   Diagnosis Date Noted  . Osteopenia 01/22/2020  . Aortic atherosclerosis ([place]) 01/22/2020  . Wheezing 01/22/2020  . Diarrhea 01/22/2020  . Insomnia 10/29/2019  . Recurrent falls 09/06/2019  . Confusion, postoperative   . Benign essential HTN   . Post-operative pain   . Chronic constipation   . 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 Post op infection 07/11/2019  . Muscle spasm   . Hypokalemia   . Status post lumbar spinal fusion 07/10/2019  . Postoperative pain   . Acute blood loss  anemia   . Labile blood pressure   . Tobacco abuse   . Hypoalbuminemia due to protein-calorie malnutrition ([place])   . Anxiety 07/05/2019  . Depression 07/05/2019  . Chronic diastolic CHF (congestive heart failure) ([place]) 07/05/2019  . Obesity (BMI 30-39.9) 07/05/2019  . ETOH abuse 07/05/2019  . Lumbar radiculopathy 06/28/2019  . Spinal stenosis of lumbar region 06/28/2019  . Acute on chronic anemia   . Tremors of nervous system   . AKI (acute kidney injury) ([place])   . Chronic pain syndrome   . Acute renal failure superimposed on stage 3 chronic kidney disease ([place]) 06/25/2019  . Acute osteomyelitis of lumbar spine ([place]) 06/25/2019  . Weakness of left lower extremity   . Abdominal pain 06/24/2019  .  Nausea 06/24/2019  . Abnormal urine odor 06/24/2019  . History of sepsis 06/24/2019  . Recent major surgery 06/24/2019  . History of diverticulosis 06/24/2019  . Elevated PTHrP level 05/09/2019  . Paresthesia 02/12/2019  . Low back pain 02/12/2019  . Neuropathy 12/20/2018  . Lumbar spinal stenosis 06/29/2018  . Preoperative clearance 06/04/2018  . Abnormal CT of the chest 06/04/2018  . Smoker 05/17/2018  . Hypothyroidism 11/30/2017  . Thoracic aortic atherosclerosis ([place]) 09/14/2016  . Benign essential tremor 09/09/2016  . RLS (restless legs syndrome) 09/09/2016  . H/O adenomatous polyp of colon 07/06/2016  . PVC's (premature ventricular contractions) 03/29/2016  . Preoperative cardiovascular examination 03/29/2016  . Grade I diastolic dysfunction 07/46/0029  . Palpitations 01/11/2016  . Shortness of breath 01/11/2016  . Former cigarette smoker 01/11/2016  . Leg edema 01/11/2016  . Chronic cough 10/28/2015  . 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 Malignant neoplasm of upper-inner quadrant of left breast in female, estrogen receptor positive ([place]) 07/14/2015  . Prediabetes 07/08/2015  . CKD (chronic kidney disease), stage III 07/08/2015  . Dependent edema 06/08/2015  .  Sleep disturbance 06/08/2015  . Mixed hyperlipidemia 06/08/2015  . Generalized anxiety disorder 06/08/2015  . Essential hypertension 06/08/2015  . COPD (chronic obstructive pulmonary disease) ([place]) 06/08/2015  . Gout 06/08/2015  . Macrocytic anemia   . 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 Sepsis ([place]) 05/15/2015  . Lumbar spondylosis 03/20/2015   Beaulah Dinning, PT, DPT 02/06/20 2:15 PM  [facility] [address] [place], [place], [address] Phone: [phone]   Fax:  [phone]  Name: Brandi Stephens MRN: [id number] Date of Birth: 02-16-1946

## 2020-02-13 ENCOUNTER — Ambulatory Visit: Payer: PPO | Admitting: Internal Medicine

## 2020-02-14 ENCOUNTER — Ambulatory Visit: Payer: PPO | Admitting: Physical Therapy

## 2020-02-17 ENCOUNTER — Telehealth: Payer: Self-pay

## 2020-02-17 NOTE — Telephone Encounter (Signed)
Patient's daughter called to request labs be done prior to visit with Dr. Megan Salon on 11/4. Reports that patient has fallen about 3 times within the last week due to increased weakness; states it's hard for her to get back up once she's fallen. She's concerned that it's related to spinal infection; no increased pain reported. Forwarding to provider.

## 2020-02-18 ENCOUNTER — Other Ambulatory Visit: Payer: Self-pay | Admitting: Internal Medicine

## 2020-02-18 ENCOUNTER — Other Ambulatory Visit: Payer: Self-pay

## 2020-02-18 ENCOUNTER — Encounter: Payer: Self-pay | Admitting: Physical Therapy

## 2020-02-18 ENCOUNTER — Ambulatory Visit: Payer: PPO | Attending: Physical Medicine and Rehabilitation | Admitting: Physical Therapy

## 2020-02-18 ENCOUNTER — Other Ambulatory Visit: Payer: PPO

## 2020-02-18 VITALS — BP 125/81 | HR 76 | Temp 96.3°F

## 2020-02-18 DIAGNOSIS — M545 Low back pain, unspecified: Secondary | ICD-10-CM | POA: Insufficient documentation

## 2020-02-18 DIAGNOSIS — G8929 Other chronic pain: Secondary | ICD-10-CM | POA: Insufficient documentation

## 2020-02-18 DIAGNOSIS — R2689 Other abnormalities of gait and mobility: Secondary | ICD-10-CM | POA: Insufficient documentation

## 2020-02-18 DIAGNOSIS — M4626 Osteomyelitis of vertebra, lumbar region: Secondary | ICD-10-CM | POA: Diagnosis not present

## 2020-02-18 DIAGNOSIS — M5416 Radiculopathy, lumbar region: Secondary | ICD-10-CM | POA: Insufficient documentation

## 2020-02-18 DIAGNOSIS — M6281 Muscle weakness (generalized): Secondary | ICD-10-CM

## 2020-02-18 NOTE — Telephone Encounter (Signed)
Spoke to patient's daughter and patient is scheduled for labs today. Brandi Stephens [place]

## 2020-02-18 NOTE — Therapy (Signed)
[place], [place], [address] Phone: [phone]   Fax:  [phone]  Physical Therapy Treatment  Patient Details  Name: Brandi Stephens MRN: [id number] Date of Birth: 01/02/1946 Referring Provider (PT): Leeroy Cha, MD   Encounter Date: 02/18/2020   PT End of Session - 02/18/20 1449    Visit Number 16    Number of Visits 26    Date for PT Re-Evaluation 03/13/20    Authorization Type [place] Medicare, next progress note by visit 24, add KX at visit 15    PT Start Time 1425   arrived late   PT Stop Time 1442    PT Time Calculation (min) 17 min    Activity Tolerance Patient limited by pain    Behavior During Therapy [facility] for tasks assessed/performed           Past Medical History:  Diagnosis Date  . AIN (anal intraepithelial neoplasia) anal canal   . Anemia    with pregnancy  . Anxiety   . Arthritis    knees, lower back, knees  . Chronic constipation   . Chronic diastolic CHF (congestive heart failure) ([place]) 07/05/2019  . CKD (chronic kidney disease)   . Coarse tremors    Essential  . COPD (chronic obstructive pulmonary disease) ([place])    2017 chest CT, history of  . Degenerative lumbar spinal stenosis   . Depression   . Depression 07/05/2019  . Drug dependency ([place])   . Elevated PTHrP level 05/09/2019  . ETOH abuse 07/05/2019  . GERD (gastroesophageal reflux disease)    pt denies  . Gout   . Grade I diastolic dysfunction 70/96/2836   Noted ECHO  . Hemorrhoids   . History of adenomatous polyp of colon   . History of basal cell carcinoma (BCC) excision 2015   nose  . History of sepsis 05/14/2014   post lumbar surgery  . History of shingles    x2  . Hyperlipidemia   . Hypertension   . Hypothyroidism   . Irregular heart beat    history of while undergoing radiation  . Malignant neoplasm of upper-inner quadrant of left breast in female, estrogen receptor positive [facility]) oncologist-  dr  Jana Hakim--  per lov in epic no recurrence   dx 07-13-2015  Left breast invasive ductal carcinoma, Stage IA, Grade 1 (TXN0),  09-16-2015  s/p  left breast lumpectomy with sln bx's,  completed radiation 11-18-2015,  started antiestrogen therapy 12-14-2015  . Neuropathy    Left foot and back of left leg  . Obesity (BMI 30-39.9) 07/05/2019  . Personal history of radiation therapy    completed 11-18-2015  left breast  . Pneumonia   . PONV (postoperative nausea and vomiting)   . Prediabetes    diet controlled, no med  . Restless leg syndrome   . Seasonal allergies   . Seizure ([place]) 05/2015   due to sepsis, only one time episode  . Tremor   . Wears partial dentures    lower     Past Surgical History:  Procedure Laterality Date  . ABDOMINAL HYSTERECTOMY  1985  . BASAL CELL CARCINOMA EXCISION  10/15   nose  . BREAST BIOPSY Right 08/24/2015  . BREAST BIOPSY Right 07/13/2015  . BREAST BIOPSY Left 07/13/2015  . BREAST EXCISIONAL BIOPSY Left 1972  . BREAST LUMPECTOMY Left   . BREAST LUMPECTOMY WITH RADIOACTIVE SEED AND SENTINEL LYMPH NODE BIOPSY Left 09/16/2015   Procedure: BREAST LUMPECTOMY WITH  RADIOACTIVE SEED AND SENTINEL LYMPH NODE BIOPSY;  Surgeon: Stark Klein, MD;  Location: [facility];  Service: General;  Laterality: Left;  . BREAST REDUCTION SURGERY Bilateral 1998  . CATARACT EXTRACTION W/ INTRAOCULAR LENS IMPLANT Left 2016  . [place]   hallo  . COLONOSCOPY  01/2013   polyps  . EYE SURGERY Left 2016  . HAND SURGERY Left 02-19-2002   dr Fredna Dow  '@[facility]'    repair collateral ligament/ MPJ left thumb  . HARDWARE REMOVAL N/A 07/24/2019   Procedure: REVISION OF HARDWARE Lumbar two - Sacral one. Extention of Fusion to Lumbar one;  Surgeon: [name], MD;  Location: [facility];  Service: Neurosurgery;  Laterality: N/A;  . HEMORRHOID SURGERY  2008  . HIGH RESOLUTION ANOSCOPY N/A 02/28/2018   Procedure: HIGH RESOLUTION ANOSCOPY WITH BIOPSY;  Surgeon:  Leighton Ruff, MD;  Location: [facility];  Service: General;  Laterality: N/A;  . KNEE ARTHROSCOPY Right 2002  . LUMBAR SPINE SURGERY  03-20-2015   dr Kathyrn Sheriff   fusion L4-5, L5-S1  . MOUTH SURGERY  07/14/2018   2 infected teeth removed  . MULTIPLE TOOTH EXTRACTIONS     with bone grafting lower bottom right and left  . REDUCTION MAMMAPLASTY Bilateral   . RIGHT COLECTOMY  09-10-2003   dr Margot Chimes '@[email or web address]'    multiple colon polyps (per path tubular adenoma's, hyperplastic , benign appendix, benign two lymph nodes  . ROTATOR CUFF REPAIR Left 04/2016  . TONSILLECTOMY  1969  . TUBAL LIGATION Bilateral 1982    Vitals:   02/18/20 1429  BP: 125/81  Pulse: 76  Temp: (!) 96.3 F (35.7 C)  SpO2: 96%     Subjective Assessment - 02/18/20 1435    Subjective Pt. arrived for appointment today reporting she has fallen 4 times in the past week. The first time she fell out of bed, 2 falls were while walking and the other was with trying to pick up an object off of the ground. With her fall earlier today she reports that she hit her head-her knees have been sore due to landing on them with falls but otherwise she does not feel like she injured anything. Pt. is concerned that she may be getting another infection/concern for osteomyelitis again and reports that she "does not feel like herself." No fever reported. See vitals which were assessed at noted. Pt.'s daughter had contacted Dr. Megan Salon who ordered labs to assess for signs of infection-she is scheduled to go to MD office later this PM for labs and will see MD again prior to next scheduled therapy session 02/20/20. Discussed given concern for infection and with recent falls including with head impact today would hold off on therapy session and recommend MD follow up prior to resuming therapy. Pt. instructed to alert MD with any further symptoms related to infection, falls, head trama and seek immediate medical attention if needed prior to MD  follow up.    Pertinent History L4-S1 fusion 03/20/15, L2-3 fusion 05/30/19, breast CA history 2017, seizures (due to sepsis),  lumbar hardware removal 07/24/19 with subsequent staph infection/osteomyelitis, CHF, COPD, chronic pain in pain management, history ETOH abuse, neuropathy, intention tremor    Currently in Pain? Yes    Pain Score 7     Pain Location Back    Pain Orientation Lower    Pain Descriptors / Indicators Stabbing;Aching    Pain Type Chronic pain    Pain Onset More than a month ago  Pain Frequency Constant    Aggravating Factors  standing and walking    Pain Relieving Factors ice and medication    Effect of Pain on Daily Activities limits standing and walking tolerance                                       PT Short Term Goals - 01/31/20 1200      PT SHORT TERM GOAL #1   Title Independent with HEP    Time 3    Period Weeks    Status Achieved      PT SHORT TERM GOAL #2   Title Improve TUG time (with RW) at least 3-5 sec for decreased fall risk    Baseline 11 sec    Time 3    Period Weeks    Status Achieved      PT SHORT TERM GOAL #3   Title Ambulate at least 100 feet CGA with SPC for progression to LTG for gait    Baseline met/able    Time 3    Period Weeks    Status Achieved             PT Long Term Goals - 01/31/20 1202      PT LONG TERM GOAL #1   Title Improve [place] Gait + Balance at least 3-5 points for decreased fall risk    Baseline 19/28    Time 6    Period Weeks    Status On-going    Target Date 03/13/20      PT LONG TERM GOAL #2   Title Increase left hip and ankle strength at least grossly 1/2 MMT grade to improve ability to ascend/descend stairs to 2nd floor bedroom    Baseline see objective-partially met    Time 6    Period Weeks    Status Partially Met    Target Date 03/13/20      PT LONG TERM GOAL #3   Title Perform household level ambulation 150 feet or less mod I with SPC vs. LRAD    Baseline  reports ambulating without AD 80% of the time at home, progressing    Time 6    Period Weeks    Status On-going    Target Date 03/13/20      PT LONG TERM GOAL #4   Title Improve 5 times sit<>stand time to 14 sec or less for improved ability for transfers from low seats    Baseline 12 sec    Time 6    Period Weeks    Status Achieved      PT LONG TERM GOAL #5   Title Tolerate standing for periods at least 15-20 min for cooking and light chores with LBP 4/10 or less    Baseline ongoing, 3/10 pain pre-tx. but intermittently higher    Time 6    Period Weeks    Status On-going    Target Date 03/13/20                 Plan - 02/18/20 1450    Clinical Impression Statement No treatment today-holding therapy pending MD follow up. see subjective for details.           Patient will benefit from skilled therapeutic intervention in order to improve the following deficits and impairments:     Visit Diagnosis: Chronic bilateral low back pain, unspecified whether sciatica present  Muscle  weakness (generalized)  Other abnormalities of gait and mobility     Problem List Patient Active Problem List   Diagnosis Date Noted  . Osteopenia 01/22/2020  . Aortic atherosclerosis ([place]) 01/22/2020  . Wheezing 01/22/2020  . Diarrhea 01/22/2020  . Insomnia 10/29/2019  . Recurrent falls 09/06/2019  . Confusion, postoperative   . Benign essential HTN   . Post-operative pain   . Chronic constipation   . 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 Post op infection 07/11/2019  . Muscle spasm   . Hypokalemia   . Status post lumbar spinal fusion 07/10/2019  . Postoperative pain   . Acute blood loss anemia   . Labile blood pressure   . Tobacco abuse   . Hypoalbuminemia due to protein-calorie malnutrition ([place])   . Anxiety 07/05/2019  . Depression 07/05/2019  . Chronic diastolic CHF (congestive heart failure) ([place]) 07/05/2019  . Obesity (BMI 30-39.9) 07/05/2019  . ETOH abuse 07/05/2019    . Lumbar radiculopathy 06/28/2019  . Spinal stenosis of lumbar region 06/28/2019  . Acute on chronic anemia   . Tremors of nervous system   . AKI (acute kidney injury) ([place])   . Chronic pain syndrome   . Acute renal failure superimposed on stage 3 chronic kidney disease ([place]) 06/25/2019  . Acute osteomyelitis of lumbar spine ([place]) 06/25/2019  . Weakness of left lower extremity   . Abdominal pain 06/24/2019  . Nausea 06/24/2019  . Abnormal urine odor 06/24/2019  . History of sepsis 06/24/2019  . Recent major surgery 06/24/2019  . History of diverticulosis 06/24/2019  . Elevated PTHrP level 05/09/2019  . Paresthesia 02/12/2019  . Low back pain 02/12/2019  . Neuropathy 12/20/2018  . Lumbar spinal stenosis 06/29/2018  . Preoperative clearance 06/04/2018  . Abnormal CT of the chest 06/04/2018  . Smoker 05/17/2018  . Hypothyroidism 11/30/2017  . Thoracic aortic atherosclerosis ([place]) 09/14/2016  . Benign essential tremor 09/09/2016  . RLS (restless legs syndrome) 09/09/2016  . H/O adenomatous polyp of colon 07/06/2016  . PVC's (premature ventricular contractions) 03/29/2016  . Preoperative cardiovascular examination 03/29/2016  . Grade I diastolic dysfunction 98/42/1031  . Palpitations 01/11/2016  . Shortness of breath 01/11/2016  . Former cigarette smoker 01/11/2016  . Leg edema 01/11/2016  . Chronic cough 10/28/2015  . 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 Malignant neoplasm of upper-inner quadrant of left breast in female, estrogen receptor positive ([place]) 07/14/2015  . Prediabetes 07/08/2015  . CKD (chronic kidney disease), stage III 07/08/2015  . Dependent edema 06/08/2015  . Sleep disturbance 06/08/2015  . Mixed hyperlipidemia 06/08/2015  . Generalized anxiety disorder 06/08/2015  . Essential hypertension 06/08/2015  . COPD (chronic obstructive pulmonary disease) ([place]) 06/08/2015  . Gout 06/08/2015  . Macrocytic anemia   . 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  Sepsis ([place]) 05/15/2015  . Lumbar spondylosis 03/20/2015    Beaulah Dinning, PT, DPT 02/18/20 2:51 PM  [facility] [address] [place], [place], [address] Phone: [phone]   Fax:  [phone]  Name: KILEEN LANGE MRN: [id number] Date of Birth: 07-Dec-1945

## 2020-02-18 NOTE — Telephone Encounter (Signed)
Lab orders placed.  

## 2020-02-19 LAB — BASIC METABOLIC PANEL
BUN/Creatinine Ratio: 30 (calc) — ABNORMAL HIGH (ref 6–22)
BUN: 33 mg/dL — ABNORMAL HIGH (ref 7–25)
CO2: 29 mmol/L (ref 20–32)
Calcium: 10.1 mg/dL (ref 8.6–10.4)
Chloride: 104 mmol/L (ref 98–110)
Creat: 1.11 mg/dL — ABNORMAL HIGH (ref 0.60–0.93)
Glucose, Bld: 115 mg/dL — ABNORMAL HIGH (ref 65–99)
Potassium: 5 mmol/L (ref 3.5–5.3)
Sodium: 142 mmol/L (ref 135–146)

## 2020-02-19 LAB — C-REACTIVE PROTEIN: CRP: 15.6 mg/L — ABNORMAL HIGH (ref <8.0)

## 2020-02-19 LAB — CBC
HCT: 37.9 % (ref 35.0–45.0)
Hemoglobin: 12.8 g/dL (ref 11.7–15.5)
MCH: 32.7 pg (ref 27.0–33.0)
MCHC: 33.8 g/dL (ref 32.0–36.0)
MCV: 96.7 fL (ref 80.0–100.0)
MPV: 9.8 fL (ref 7.5–12.5)
Platelets: 274 10*3/uL (ref 140–400)
RBC: 3.92 10*6/uL (ref 3.80–5.10)
RDW: 14.5 % (ref 11.0–15.0)
WBC: 9.3 10*3/uL (ref 3.8–10.8)

## 2020-02-19 LAB — SEDIMENTATION RATE: Sed Rate: 22 mm/h (ref 0–30)

## 2020-02-20 ENCOUNTER — Encounter: Payer: Self-pay | Admitting: Internal Medicine

## 2020-02-20 ENCOUNTER — Ambulatory Visit (INDEPENDENT_AMBULATORY_CARE_PROVIDER_SITE_OTHER): Payer: PPO | Admitting: Internal Medicine

## 2020-02-20 ENCOUNTER — Other Ambulatory Visit: Payer: Self-pay | Admitting: Internal Medicine

## 2020-02-20 ENCOUNTER — Ambulatory Visit: Payer: PPO | Admitting: Physical Therapy

## 2020-02-20 ENCOUNTER — Other Ambulatory Visit: Payer: Self-pay

## 2020-02-20 DIAGNOSIS — M4626 Osteomyelitis of vertebra, lumbar region: Secondary | ICD-10-CM

## 2020-02-20 DIAGNOSIS — R197 Diarrhea, unspecified: Secondary | ICD-10-CM | POA: Diagnosis not present

## 2020-02-20 DIAGNOSIS — R296 Repeated falls: Secondary | ICD-10-CM

## 2020-02-20 NOTE — Progress Notes (Signed)
Pt is doing a virtual Monday. She is already in PT

## 2020-02-20 NOTE — Progress Notes (Signed)
[facility]  Patient Active Problem List   Diagnosis Date Noted  . Diarrhea 01/22/2020    Priority: High  . Recurrent falls 09/06/2019    Priority: High  . Status post lumbar spinal fusion 07/10/2019    Priority: High  . Acute osteomyelitis of lumbar spine ([place]) 06/25/2019    Priority: High  . Osteopenia 01/22/2020  . Aortic atherosclerosis ([place]) 01/22/2020  . Wheezing 01/22/2020  . Insomnia 10/29/2019  . Confusion, postoperative   . Benign essential HTN   . Post-operative pain   . Chronic constipation   . 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 Post op infection 07/11/2019  . Muscle spasm   . Hypokalemia   . Postoperative pain   . Acute blood loss anemia   . Labile blood pressure   . Tobacco abuse   . Hypoalbuminemia due to protein-calorie malnutrition ([place])   . Anxiety 07/05/2019  . Depression 07/05/2019  . Chronic diastolic CHF (congestive heart failure) ([place]) 07/05/2019  . Obesity (BMI 30-39.9) 07/05/2019  . ETOH abuse 07/05/2019  . Lumbar radiculopathy 06/28/2019  . Spinal stenosis of lumbar region 06/28/2019  . Acute on chronic anemia   . Tremors of nervous system   . AKI (acute kidney injury) ([place])   . Chronic pain syndrome   . Acute renal failure superimposed on stage 3 chronic kidney disease ([place]) 06/25/2019  . Weakness of left lower extremity   . Abdominal pain 06/24/2019  . Nausea 06/24/2019  . Abnormal urine odor 06/24/2019  . History of sepsis 06/24/2019  . Recent major surgery 06/24/2019  . History of diverticulosis 06/24/2019  . Elevated PTHrP level 05/09/2019  . Paresthesia 02/12/2019  . Low back pain 02/12/2019  . Neuropathy 12/20/2018  . Lumbar spinal stenosis 06/29/2018  . Preoperative clearance 06/04/2018  . Abnormal CT of the chest 06/04/2018  . Smoker 05/17/2018  . Hypothyroidism 11/30/2017  . Thoracic aortic atherosclerosis ([place]) 09/14/2016  . Benign essential tremor 09/09/2016  . RLS (restless  legs syndrome) 09/09/2016  . H/O adenomatous polyp of colon 07/06/2016  . PVC's (premature ventricular contractions) 03/29/2016  . Preoperative cardiovascular examination 03/29/2016  . Grade I diastolic dysfunction 02/54/2706  . Palpitations 01/11/2016  . Shortness of breath 01/11/2016  . Former cigarette smoker 01/11/2016  . Leg edema 01/11/2016  . Chronic cough 10/28/2015  . 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 Malignant neoplasm of upper-inner quadrant of left breast in female, estrogen receptor positive ([place]) 07/14/2015  . Prediabetes 07/08/2015  . CKD (chronic kidney disease), stage III 07/08/2015  . Dependent edema 06/08/2015  . Sleep disturbance 06/08/2015  . Mixed hyperlipidemia 06/08/2015  . Generalized anxiety disorder 06/08/2015  . Essential hypertension 06/08/2015  . COPD (chronic obstructive pulmonary disease) ([place]) 06/08/2015  . Gout 06/08/2015  . Macrocytic anemia   . 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 Sepsis ([place]) 05/15/2015  . Lumbar spondylosis 03/20/2015    Patient's Medications  New Prescriptions   No medications on file  Previous Medications   ACETAMINOPHEN (TYLENOL) 325 MG TABLET    Take 2 tablets (650 mg total) by mouth every 6 (six) hours as needed for moderate pain.   ALBUTEROL (VENTOLIN HFA) 108 (90 BASE) MCG/ACT INHALER    INHALE 2 PUFFS BY MOUTH EVERY 4 TO 6 HOURS AS NEEDED   ALLOPURINOL (ZYLOPRIM) 300 MG TABLET    TAKE 1 TABLET(300 MG) BY MOUTH DAILY   AMITRIPTYLINE (ELAVIL) 75 MG TABLET    Take 1  tablet (75 mg total) by mouth at bedtime.   ANASTROZOLE (ARIMIDEX) 1 MG TABLET    TAKE 1 TABLET(1 MG) BY MOUTH DAILY   ATORVASTATIN (LIPITOR) 20 MG TABLET    Take 1 tablet (20 mg total) by mouth daily.   BETAMETHASONE DIPROPIONATE 0.05 % CREAM    Apply topically 2 (two) times daily.   CEPHALEXIN (KEFLEX) 500 MG CAPSULE    Take 1 capsule (500 mg total) by mouth 3 (three) times daily.   DIAZEPAM (VALIUM) 5 MG TABLET    Take 1 tablet (5 mg total) by  mouth every 6 (six) hours as needed for muscle spasms.   DICLOFENAC SODIUM (VOLTAREN) 1 % GEL    Apply 4 g topically 4 (four) times daily.   DOCUSATE SODIUM (COLACE) 100 MG CAPSULE    Take 1 capsule (100 mg total) by mouth 3 (three) times daily.   DULOXETINE (CYMBALTA) 60 MG CAPSULE    TAKE 1 CAPSULE(60 MG) BY MOUTH DAILY   FUROSEMIDE (LASIX) 20 MG TABLET    TAKE 1 TABLET(20 MG) BY MOUTH DAILY AS NEEDED   HYDROCODONE-ACETAMINOPHEN (NORCO/VICODIN) 5-325 MG TABLET    Take 1 tablet by mouth every 6 (six) hours as needed for moderate pain.   LEVOTHYROXINE (SYNTHROID) 75 MCG TABLET    Take 1 tablet (75 mcg total) by mouth daily before breakfast.   MAGNESIUM GLUCONATE (MAGONATE) 500 MG TABLET    Take 2 tablets (1,000 mg total) by mouth at bedtime.   METOPROLOL SUCCINATE (TOPROL-XL) 25 MG 24 HR TABLET    Take 1 tablet (25 mg total) by mouth daily.   ONDANSETRON (ZOFRAN) 4 MG TABLET    Take 1 tablet (4 mg total) by mouth daily as needed for nausea or vomiting.   POLYETHYLENE GLYCOL POWDER (MIRALAX) POWDER    Take 17 g by mouth daily as needed for moderate constipation.    PRIMIDONE (MYSOLINE) 50 MG TABLET    Take 1 tablet (50 mg total) by mouth 2 (two) times daily. TAKE 1 TABLET(50 MG) BY MOUTH AT BEDTIME   PROBIOTIC PRODUCT (PROBIOTIC DAILY PO)    Take by mouth.   QUETIAPINE (SEROQUEL) 25 MG TABLET    Take 1 tablet (25 mg total) by mouth at bedtime.   ROPINIROLE (REQUIP) 0.25 MG TABLET    Take 3 tablets (0.75 mg total) by mouth at bedtime.   VITAMIN B-12 (CYANOCOBALAMIN) 1000 MCG TABLET    Take 1 tablet (1,000 mcg total) by mouth daily.  Modified Medications   No medications on file  Discontinued Medications   No medications on file    Subjective: Brandi Stephens is in for her routine follow-up visit.  She is accompanied by her daughter.  She has a history of lumbar stenosis and radiculopathy.  She underwent a third lumbar surgery on 05/30/2019.  She had decompression of L2-3 with extension of her previous  fusion from L2-S1.  She was discharged home the following day.  She says that she had severe pain right after surgery that was unlike anything she had experienced with previous surgeries. She said she felt like her back was swollen although there was no visible swelling, redness or unusual drainage from her incision.  The pain progressively worsened.  After about a week she also began to notice some left lower quadrant pain that she said felt like "ovarian pain".  She was treated with 2 rounds of steroids without relief.  Because of worsening pain she was admitted to the hospital on 06/24/2019.  She noted worsening pain and muscle spasms.  Because of the left lower quadrant pain she was evaluated for possible UTI.  She did not have any dysuria.  She was treated with ciprofloxacin from March 18-22 without any change in her pain.  Repeat CT scan revealed:  Musculoskeletal: Multilevel degenerative changes of the spine L2-S1 disc spacer and posterior fusion. There is grade 1 L2-L3 retrolisthesis with progression of the posterior slippage of the disc spacer at L2-L3 compared to the prior CT. The L2 fusion screws breach the superior endplate of L2 similar to prior CT. There is bone lucency adjacent to the L2 screws in the posterior elements, left greater right and progressed since the prior CT. There is thickening of the prevertebral soft tissues at L2-L3 with several ill-defined small loculated fluid in the adjacent soft tissues and in the psoas musculature most consistent with small abscesses. There is irregularity of the L2 inferior endplate and L3 superior endplate. Posterior hardware is intact. No acute osseous pathology.  IMPRESSION: 1. Extensive postsurgical changes of the lumbar spine with posterior fusion at L2-S1. Interval posterior slippage of the disc spacer at L2-L3 with findings of loosening of the left L2 screw. There are findings of infection and small paraspinal abscesses at  L2-L3.  Fluid was aspirated from the L3-4 level on 07/11/2019.  No organisms were seen on Gram stain but cultures grew methicillin susceptible coagulase-negative staph.  A PICC was placed and she was started on IV cefazolin.  She had further debridement and hardware revision on 07/24/2019.  She completed 41 days of therapy on 08/21/2019 before converting to oral cephalexin.  She has now completed 7 months of total therapy.  She and her daughter have been worried because she has been declining over the past few weeks.  She has had recurrent falls at home even when she was using her walker.  On one occasion she was unable to get up off of the floor and it took her an hour and a half to get the attention of her husband who was in another room watching television.  She has been getting progressively weaker and her daughter thinks that she is having some new memory loss.  She has not been going to physical therapy because the therapist tell her that she is too weak and should go home.  As a result she has not been taking as much hydrocodone (which he normally takes before physical therapy).  Her low back pain has been worse in the last 2 weeks.  Her daughter thinks that her chronic diarrhea is also worse.  She has not had any fever.  Review of Systems: Review of Systems  Constitutional: Positive for malaise/fatigue. Negative for chills, diaphoresis, fever and weight loss.  Respiratory: Negative for cough and shortness of breath.   Cardiovascular: Negative for chest pain.  Gastrointestinal: Positive for diarrhea. Negative for abdominal pain, constipation, nausea and vomiting.  Genitourinary: Negative for dysuria.  Musculoskeletal: Positive for back pain and falls.  Neurological: Positive for weakness.  Psychiatric/Behavioral: The patient has insomnia.     Past Medical History:  Diagnosis Date  . AIN (anal intraepithelial neoplasia) anal canal   . Anemia    with pregnancy  . Anxiety   . Arthritis     knees, lower back, knees  . Chronic constipation   . Chronic diastolic CHF (congestive heart failure) ([place]) 07/05/2019  . CKD (chronic kidney disease)   . Coarse tremors    Essential  . COPD (chronic obstructive  pulmonary disease) ([place])    2017 chest CT, history of  . Degenerative lumbar spinal stenosis   . Depression   . Depression 07/05/2019  . Drug dependency ([place])   . Elevated PTHrP level 05/09/2019  . ETOH abuse 07/05/2019  . GERD (gastroesophageal reflux disease)    pt denies  . Gout   . Grade I diastolic dysfunction 78/24/2353   Noted ECHO  . Hemorrhoids   . History of adenomatous polyp of colon   . History of basal cell carcinoma (BCC) excision 2015   nose  . History of sepsis 05/14/2014   post lumbar surgery  . History of shingles    x2  . Hyperlipidemia   . Hypertension   . Hypothyroidism   . Irregular heart beat    history of while undergoing radiation  . Malignant neoplasm of upper-inner quadrant of left breast in female, estrogen receptor positive [facility]) oncologist-  dr Jana Hakim--  per lov in epic no recurrence   dx 07-13-2015  Left breast invasive ductal carcinoma, Stage IA, Grade 1 (TXN0),  09-16-2015  s/p  left breast lumpectomy with sln bx's,  completed radiation 11-18-2015,  started antiestrogen therapy 12-14-2015  . Neuropathy    Left foot and back of left leg  . Obesity (BMI 30-39.9) 07/05/2019  . Personal history of radiation therapy    completed 11-18-2015  left breast  . Pneumonia   . PONV (postoperative nausea and vomiting)   . Prediabetes    diet controlled, no med  . Restless leg syndrome   . Seasonal allergies   . Seizure ([place]) 05/2015   due to sepsis, only one time episode  . Tremor   . Wears partial dentures    lower     Social History   Tobacco Use  . Smoking status: Former Smoker    Packs/day: 0.50    Years: 35.00    Pack years: 17.50    Types: Cigarettes    Quit date: 05/22/2018    Years since quitting: 1.7  . Smokeless tobacco:  Never Used  Vaping Use  . Vaping Use: Never used  Substance Use Topics  . Alcohol use: Yes    Comment: 1 glass of wine a night  . Drug use: Not Currently    Comment: CBD tincture    Family History  Problem Relation Age of Onset  . Atrial fibrillation Mother   . Ulcers Father   . Breast cancer Maternal Aunt   . Lung cancer Maternal Uncle   . Stomach cancer Maternal Grandmother   . Lung cancer Maternal Aunt   . Brain cancer Maternal Aunt   . Prostate cancer Maternal Grandfather   . Stroke Paternal Grandmother   . Tuberculosis Paternal Grandfather   . Colon cancer Neg Hx   . Esophageal cancer Neg Hx   . Rectal cancer Neg Hx     Allergies  Allergen Reactions  . Ampicillin Hives and Other (See Comments)    Severe reaction in February 2017 1.5 month to have hives to go away  . Gabapentin Swelling    UNSPECIFIED REACTION   . Penicillins Hives and Other (See Comments)    Severe reaction in February 2017 1.5 month to have hives to go away Has patient had a PCN reaction causing immediate rash, facial/tongue/throat swelling, SOB or lightheadedness with hypotension: #  #  #  YES  #  #  #  Has patient had a PCN reaction causing severe rash involving mucus membranes or skin necrosis:  No Has patient had a PCN reaction that required hospitalization No Has patient had a PCN reaction occurring within the last 10 years: No   . Codeine Nausea And Vomiting  . Other Itching    UNSPECIFIED Analgesics    Objective: Vitals:   02/20/20 1052  BP: (!) 162/94  Pulse: 88  Temp: 98.2 F (36.8 C)  TempSrc: Oral  Weight: 260 lb (117.9 kg)   Body mass index is 40.12 kg/m.  Physical Exam Constitutional:      General: She is not in acute distress.    Comments: She is worried.  She is accompanied by her daughter.  Cardiovascular:     Rate and Rhythm: Normal rate and regular rhythm.     Heart sounds: No murmur heard.   Pulmonary:     Effort: Pulmonary effort is normal.     Breath  sounds: Normal breath sounds.  Abdominal:     Palpations: Abdomen is soft.     Tenderness: There is no abdominal tenderness.  Musculoskeletal:     Comments: Her back incision has healed without evidence of infection.  Skin:    Findings: No rash.  Neurological:     General: No focal deficit present.     Mental Status: She is alert.  Psychiatric:        Mood and Affect: Mood normal.     Sed Rate (mm/h)  Date Value  02/18/2020 22  10/29/2019 19  09/24/2019 29   CRP (mg/L)  Date Value  02/18/2020 15.6 (H)  10/29/2019 9.2 (H)  09/24/2019 17.8 (H)  02/12/2019 <1     Problem List Items Addressed This Visit      High   Acute osteomyelitis of lumbar spine (HCC)    It is unclear to me if her worsening back pain is due to the fact that she has decreased her hydrocodone dosing recently or due to worsening infection.  It does not seem like the pain is getting steadily worse over the last 2 weeks.  Her sedimentation rate has normalized but her C-reactive protein remains elevated.  She will continue cephalexin for now.  I will arrange a video visit in 3 to 4 weeks.  If her pain continues to worsen she may need further imaging.      Recurrent falls    She has had 4 falls in the last 2 weeks, feels weaker and may have some new problems with her memory and concentration.  They feel that she is still safe to be at home but I am very concerned that she may fall and break her hip or have some other trauma.  Suggested that she use her bedside commode rather than getting up in the middle of the night to go to the bathroom.  I also asked her to notify her PCP, Harland Dingwall, of these recent changes.      Diarrhea    I will obtain a C. difficile PCR.      Relevant Orders   Clostridium Difficile by PCR([facility])       [name], MD [place] [facility] [phone] pager   [phone] cell 02/20/2020, 1:10 PM

## 2020-02-20 NOTE — Progress Notes (Signed)
Please contact patient.  She saw Dr. Megan Salon, ID, and she has had more falls recently and some memory concerns.  Do we need to do a virtual or get her in here?  Do we need to order home OT, PT?  Please see what she and her daughter prefer.

## 2020-02-20 NOTE — Assessment & Plan Note (Signed)
I will obtain a C. difficile PCR.

## 2020-02-20 NOTE — Assessment & Plan Note (Signed)
It is unclear to me if her worsening back pain is due to the fact that she has decreased her hydrocodone dosing recently or due to worsening infection.  It does not seem like the pain is getting steadily worse over the last 2 weeks.  Her sedimentation rate has normalized but her C-reactive protein remains elevated.  She will continue cephalexin for now.  I will arrange a video visit in 3 to 4 weeks.  If her pain continues to worsen she may need further imaging.

## 2020-02-20 NOTE — Assessment & Plan Note (Signed)
She has had 4 falls in the last 2 weeks, feels weaker and may have some new problems with her memory and concentration.  They feel that she is still safe to be at home but I am very concerned that she may fall and break her hip or have some other trauma.  Suggested that she use her bedside commode rather than getting up in the middle of the night to go to the bathroom.  I also asked her to notify her PCP, Harland Dingwall, of these recent changes.

## 2020-02-24 ENCOUNTER — Encounter: Payer: PPO | Admitting: Physical Therapy

## 2020-02-24 ENCOUNTER — Other Ambulatory Visit: Payer: Self-pay

## 2020-02-24 ENCOUNTER — Encounter: Payer: Self-pay | Admitting: Family Medicine

## 2020-02-24 ENCOUNTER — Other Ambulatory Visit: Payer: PPO

## 2020-02-24 ENCOUNTER — Telehealth (INDEPENDENT_AMBULATORY_CARE_PROVIDER_SITE_OTHER): Payer: PPO | Admitting: Family Medicine

## 2020-02-24 VITALS — Wt 259.0 lb

## 2020-02-24 DIAGNOSIS — R609 Edema, unspecified: Secondary | ICD-10-CM | POA: Insufficient documentation

## 2020-02-24 DIAGNOSIS — R296 Repeated falls: Secondary | ICD-10-CM

## 2020-02-24 DIAGNOSIS — Z8619 Personal history of other infectious and parasitic diseases: Secondary | ICD-10-CM

## 2020-02-24 DIAGNOSIS — I10 Essential (primary) hypertension: Secondary | ICD-10-CM

## 2020-02-24 DIAGNOSIS — R413 Other amnesia: Secondary | ICD-10-CM | POA: Diagnosis not present

## 2020-02-24 DIAGNOSIS — M5416 Radiculopathy, lumbar region: Secondary | ICD-10-CM | POA: Diagnosis not present

## 2020-02-24 DIAGNOSIS — R600 Localized edema: Secondary | ICD-10-CM | POA: Insufficient documentation

## 2020-02-24 DIAGNOSIS — R197 Diarrhea, unspecified: Secondary | ICD-10-CM

## 2020-02-24 NOTE — Progress Notes (Signed)
Subjective:  Documentation for virtual audio and video telecommunications through [facility] encounter:  The patient was located at home. 2 patient identifiers used.  The provider was located in the office. The patient did consent to this visit and is aware of possible charges through their insurance for this visit.  The other persons participating in this telemedicine service were her daughter [name]. Time spent on call was 20 minutes and in review of previous records 24 minutes total.  This virtual service is not related to other E/M service within previous 7 days.    Patient ID: Brandi Stephens, female    DOB: Nov 21, 1945, 74 y.o.   MRN: [id number]  HPI Chief Complaint  Patient presents with   discuss falsl    discuss falls, certain times with memory is worse   This is a virtual visit to follow-up on new concerns regarding increased falls in memory.  Also to follow-up on chronic health conditions. Recently saw Dr. Megan Salon, ID on 11/04/2021and disclosed that she has been falling at home.  Reports feeling weak all over.  States she has physical therapy but did not go last week due to feeling so weak.  Questionable memory concern and decline in her overall health.  States her daughter is worried about her memory.  States she only has issues with her memory when she is in a lot of pain or when she feels anxious.   Dr. Krista Blue is her neurologist and she has not seen her and quite some time.  Has never seen her for memory issues.  Cataracts interfering with vision and may be contributing to her falls per her daughter.  She will schedule to have his surgery soon.  Tomorrow she sees Dr. Ubaldo Glassing for hand rash.   PT Thursday. She thinks she may be getting a spinal stimulator for back pain.  She has a complex history of back surgeries and back pain.  Diarrhea is an issue again.  Being followed by Dr. Megan Salon for this.  She is doing a sample today and her daughter is taking it in.  LE  edema. Would like permission to take Lasix prn  States she has not been checking her BP at home.   Denies fever, chills, headache, chest pain, palpitations, shortness of breath, abdominal pain, N/V, urinary symptoms.  No sign of infection currently patient and daughter.    Review of Systems Pertinent positives and negatives in the history of present illness.     Objective:   Physical Exam Wt 259 lb (117.5 kg)    BMI 39.97 kg/m   Alert and oriented and in no acute distress.  She was lying in her bed during the visit.  Respirations unlabored.  Normal speech, mood and thought process.      Assessment & Plan:  Recurrent falls -I am concerned that she is having more falls.  She is not in favor of using a bedside commode.  We discussed physical therapy to help her with her strength.  I also recommend that she follow-up with her eye doctor regarding needed cataract surgery per her daughter.  Encouraged her to keep her neurosurgeon in the loop since this may be due to her back surgery.  Lumbar radiculopathy -Managed by back specialist  Diarrhea, unspecified type -Being followed by Dr. Megan Salon.  She is turning in a stool specimen later today.  Benign essential HTN -Encouraged her to keep an eye on her blood pressure at home and let me know if it is higher than  goal consistently.  History of sepsis -She and her daughter both aware that she has an increased risk of becoming septic due to her history and complex health conditions.  No sign of infection currently and no red flag symptoms.  Peripheral edema -She may use furosemide 20 mg as needed.  She is aware that she needs to take this on a regular basis that she should come in for labs to check her potassium level.  Short-term memory loss Virtual MMSE was done by Gabriel Cirri, CMA, however this is limiting since all components of the test are unable to be performed virtually.  She did score well on the sections that were done.  Daughter  is more concerned about patient's memory than the patient has.  The patient is pretty certain that her memory is only affected when she is in pain or feeling anxious.  Discussed that they should keep a close eye on this and if they notice any worsening symptoms to let me know or follow-up with her neurologist, Dr. Krista Blue

## 2020-02-24 NOTE — Addendum Note (Signed)
Addended by: Caffie Pinto on: 02/24/2020 03:03 PM   Modules accepted: Orders

## 2020-02-24 NOTE — Addendum Note (Signed)
Addended by: Caffie Pinto on: 02/24/2020 03:06 PM   Modules accepted: Orders

## 2020-02-25 DIAGNOSIS — L218 Other seborrheic dermatitis: Secondary | ICD-10-CM | POA: Diagnosis not present

## 2020-02-25 DIAGNOSIS — L920 Granuloma annulare: Secondary | ICD-10-CM | POA: Diagnosis not present

## 2020-02-25 DIAGNOSIS — Z85828 Personal history of other malignant neoplasm of skin: Secondary | ICD-10-CM | POA: Diagnosis not present

## 2020-02-25 LAB — C. DIFFICILE GDH AND TOXIN A/B
GDH ANTIGEN: NOT DETECTED
MICRO NUMBER:: 11174934
SPECIMEN QUALITY:: ADEQUATE
TOXIN A AND B: NOT DETECTED

## 2020-02-26 ENCOUNTER — Encounter: Payer: PPO | Admitting: Physical Medicine and Rehabilitation

## 2020-02-26 ENCOUNTER — Other Ambulatory Visit: Payer: Self-pay

## 2020-02-27 ENCOUNTER — Ambulatory Visit: Payer: PPO | Admitting: Physical Therapy

## 2020-02-28 ENCOUNTER — Ambulatory Visit: Payer: PPO | Admitting: Physical Therapy

## 2020-03-02 ENCOUNTER — Other Ambulatory Visit: Payer: Self-pay | Admitting: Physical Medicine and Rehabilitation

## 2020-03-02 ENCOUNTER — Ambulatory Visit: Payer: PPO | Admitting: Physical Therapy

## 2020-03-02 ENCOUNTER — Other Ambulatory Visit: Payer: Self-pay

## 2020-03-02 ENCOUNTER — Encounter: Payer: Self-pay | Admitting: Physical Therapy

## 2020-03-02 DIAGNOSIS — R2689 Other abnormalities of gait and mobility: Secondary | ICD-10-CM

## 2020-03-02 DIAGNOSIS — M5416 Radiculopathy, lumbar region: Secondary | ICD-10-CM | POA: Diagnosis not present

## 2020-03-02 DIAGNOSIS — M545 Low back pain, unspecified: Secondary | ICD-10-CM

## 2020-03-02 DIAGNOSIS — G8929 Other chronic pain: Secondary | ICD-10-CM | POA: Diagnosis not present

## 2020-03-02 DIAGNOSIS — M6281 Muscle weakness (generalized): Secondary | ICD-10-CM

## 2020-03-02 NOTE — Therapy (Signed)
[place], [place], [address] Phone: [phone]   Fax:  [phone]  Physical Therapy Treatment  Patient Details  Name: Brandi Stephens MRN: [id number] Date of Birth: [birth year] Referring Provider (PT): Leeroy Cha, MD   Encounter Date: 03/02/2020   PT End of Session - 03/02/20 1107    Visit Number 17    Number of Visits 26    Date for PT Re-Evaluation 03/13/20    Authorization Type [place] Medicare, next progress note by visit 24, add KX at visit 15    PT Start Time 1100    PT Stop Time 1145    PT Time Calculation (min) 45 min           Past Medical History:  Diagnosis Date  . AIN (anal intraepithelial neoplasia) anal canal   . Anemia    with pregnancy  . Anxiety   . Arthritis    knees, lower back, knees  . Chronic constipation   . Chronic diastolic CHF (congestive heart failure) ([place]) 07/05/2019  . CKD (chronic kidney disease)   . Coarse tremors    Essential  . COPD (chronic obstructive pulmonary disease) ([place])    2017 chest CT, history of  . Degenerative lumbar spinal stenosis   . Depression   . Depression 07/05/2019  . Drug dependency ([place])   . Elevated PTHrP level 05/09/2019  . ETOH abuse 07/05/2019  . GERD (gastroesophageal reflux disease)    pt denies  . Gout   . Grade I diastolic dysfunction 77/93/9030   Noted ECHO  . Hemorrhoids   . History of adenomatous polyp of colon   . History of basal cell carcinoma (BCC) excision 2015   nose  . History of sepsis 05/14/2014   post lumbar surgery  . History of shingles    x2  . Hyperlipidemia   . Hypertension   . Hypothyroidism   . Irregular heart beat    history of while undergoing radiation  . Malignant neoplasm of upper-inner quadrant of left breast in female, estrogen receptor positive [facility]) oncologist-  dr Jana Hakim--  per lov in epic no recurrence   dx 07-13-2015  Left breast invasive ductal carcinoma, Stage IA, Grade 1 (TXN0),   09-16-2015  s/p  left breast lumpectomy with sln bx's,  completed radiation 11-18-2015,  started antiestrogen therapy 12-14-2015  . Neuropathy    Left foot and back of left leg  . Obesity (BMI 30-39.9) 07/05/2019  . Personal history of radiation therapy    completed 11-18-2015  left breast  . Pneumonia   . PONV (postoperative nausea and vomiting)   . Prediabetes    diet controlled, no med  . Restless leg syndrome   . Seasonal allergies   . Seizure ([place]) 05/2015   due to sepsis, only one time episode  . Tremor   . Wears partial dentures    lower     Past Surgical History:  Procedure Laterality Date  . ABDOMINAL HYSTERECTOMY  1985  . BASAL CELL CARCINOMA EXCISION  10/15   nose  . BREAST BIOPSY Right 08/24/2015  . BREAST BIOPSY Right 07/13/2015  . BREAST BIOPSY Left 07/13/2015  . BREAST EXCISIONAL BIOPSY Left 1972  . BREAST LUMPECTOMY Left   . BREAST LUMPECTOMY WITH RADIOACTIVE SEED AND SENTINEL LYMPH NODE BIOPSY Left 09/16/2015   Procedure: BREAST LUMPECTOMY WITH RADIOACTIVE SEED AND SENTINEL LYMPH NODE BIOPSY;  Surgeon: Stark Klein, MD;  Location: [facility];  Service: General;  Laterality: Left;  . BREAST REDUCTION SURGERY Bilateral 1998  . CATARACT EXTRACTION W/ INTRAOCULAR LENS IMPLANT Left 2016  . [place]   hallo  . COLONOSCOPY  01/2013   polyps  . EYE SURGERY Left 2016  . HAND SURGERY Left 02-19-2002   dr Fredna Dow  _0    repair collateral ligament/ MPJ left thumb  . HARDWARE REMOVAL N/A 07/24/2019   Procedure: REVISION OF HARDWARE Lumbar two - Sacral one. Extention of Fusion to Lumbar one;  Surgeon: [name], MD;  Location: [facility];  Service: Neurosurgery;  Laterality: N/A;  . HEMORRHOID SURGERY  2008  . HIGH RESOLUTION ANOSCOPY N/A 02/28/2018   Procedure: HIGH RESOLUTION ANOSCOPY WITH BIOPSY;  Surgeon: Leighton Ruff, MD;  Location: [facility];  Service: General;  Laterality: N/A;  . KNEE ARTHROSCOPY Right 2002   . LUMBAR SPINE SURGERY  03-20-2015   dr Kathyrn Sheriff   fusion L4-5, L5-S1  . MOUTH SURGERY  07/14/2018   2 infected teeth removed  . MULTIPLE TOOTH EXTRACTIONS     with bone grafting lower bottom right and left  . REDUCTION MAMMAPLASTY Bilateral   . RIGHT COLECTOMY  09-10-2003   dr Margot Chimes _1    multiple colon polyps (per path tubular adenoma's, hyperplastic , benign appendix, benign two lymph nodes  . ROTATOR CUFF REPAIR Left 04/2016  . TONSILLECTOMY  1969  . TUBAL LIGATION Bilateral 1982    There were no vitals filed for this visit.   Subjective Assessment - 03/02/20 1104    Subjective I have not fallen in 12 days.    Currently in Pain? Yes    Pain Score 5     Pain Location Back    Pain Orientation Lower    Pain Descriptors / Indicators Aching    Aggravating Factors  falling    Pain Relieving Factors ice and meds, topical ointment                             [facility] Adult PT Treatment/Exercise - 03/02/20 0001      Lumbar Exercises: Aerobic   Nustep L4 x 5 min UE/LE      Lumbar Exercises: Standing   Heel Raises 15 reps    Heel Raises Limitations toe rases x 15     Other Standing Lumbar Exercises 6 inch step up x 10 each in parallel bars, alternating and unilateral step taps     Other Standing Lumbar Exercises narrow stance on foam, then with head turns- became dizzy       Lumbar Exercises: Seated   Sit to Stand 10 reps    Sit to Stand Limitations 2 sets       Lumbar Exercises: Supine   Bent Knee Raise 15 reps    Straight Leg Raise 10 reps    Straight Leg Raises Limitations with abdominal draw in       Knee/Hip Exercises: Standing   Other Standing Knee Exercises alternating and unilateral step taps to 6 inch step - light UE     Other Standing Knee Exercises rom aires hip abduction and extension x 10 each                     PT Short Term Goals - 01/31/20 1200      PT SHORT TERM GOAL #1   Title Independent with HEP    Time 3    Period  Weeks  Status Achieved      PT SHORT TERM GOAL #2   Title Improve TUG time (with RW) at least 3-5 sec for decreased fall risk    Baseline 11 sec    Time 3    Period Weeks    Status Achieved      PT SHORT TERM GOAL #3   Title Ambulate at least 100 feet CGA with SPC for progression to LTG for gait    Baseline met/able    Time 3    Period Weeks    Status Achieved             PT Long Term Goals - 01/31/20 1202      PT LONG TERM GOAL #1   Title Improve [place] Gait + Balance at least 3-5 points for decreased fall risk    Baseline 19/28    Time 6    Period Weeks    Status On-going    Target Date 03/13/20      PT LONG TERM GOAL #2   Title Increase left hip and ankle strength at least grossly 1/2 MMT grade to improve ability to ascend/descend stairs to 2nd floor bedroom    Baseline see objective-partially met    Time 6    Period Weeks    Status Partially Met    Target Date 03/13/20      PT LONG TERM GOAL #3   Title Perform household level ambulation 150 feet or less mod I with SPC vs. LRAD    Baseline reports ambulating without AD 80% of the time at home, progressing    Time 6    Period Weeks    Status On-going    Target Date 03/13/20      PT LONG TERM GOAL #4   Title Improve 5 times sit<>stand time to 14 sec or less for improved ability for transfers from low seats    Baseline 12 sec    Time 6    Period Weeks    Status Achieved      PT LONG TERM GOAL #5   Title Tolerate standing for periods at least 15-20 min for cooking and light chores with LBP 4/10 or less    Baseline ongoing, 3/10 pain pre-tx. but intermittently higher    Time 6    Period Weeks    Status On-going    Target Date 03/13/20                 Plan - 03/02/20 1152    Clinical Impression Statement Pt arrives after missing two weeks of PT due to F/U with MD regarding falls and also due to PT provider out of office. She reports no falls in last 12 days and arrives with RW. Focused  conditioning and gross strength with rest breaks as needed. She tolerated session well with one episode of dizziness with head turns on airex.    PT Next Visit Plan continue to monitor vitals as needed, pending pain and fatigue continue exercise progression with step up variations, closed chain strengthening for LE, balance, core/back strengthening    PT Home Exercise Plan Access Code: [email or web address]: [email or web address]: 08/31/2021Prepared by: Caesar Bookman Squat with Counter Support - 2 x daily - 7 x weekly - 2 sets - 10 repsStanding March with Counter Support - 2 x daily - 7 x weekly - 3 sets - 10 repsStanding Heel Raise with Support - 2 x daily - 7 x weekly - 3 sets - 10 repsStanding Hip Abduction with  Counter Support - 2 x daily - 7 x weekly - 3 sets - 10 reps           Patient will benefit from skilled therapeutic intervention in order to improve the following deficits and impairments:  Abnormal gait, Decreased balance, Decreased endurance, Difficulty walking, Impaired sensation, Pain, Impaired flexibility, Decreased strength, Decreased activity tolerance  Visit Diagnosis: Chronic bilateral low back pain, unspecified whether sciatica present  Muscle weakness (generalized)  Other abnormalities of gait and mobility  Lumbar radiculopathy     Problem List Patient Active Problem List   Diagnosis Date Noted  . Short-term memory loss 02/24/2020  . Peripheral edema 02/24/2020  . Osteopenia 01/22/2020  . Aortic atherosclerosis ([place]) 01/22/2020  . Wheezing 01/22/2020  . Diarrhea 01/22/2020  . Insomnia 10/29/2019  . Recurrent falls 09/06/2019  . Confusion, postoperative   . Benign essential HTN   . Post-operative pain   . Chronic constipation   . 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 Post op infection 07/11/2019  . Muscle spasm   . Hypokalemia   . Status post lumbar spinal fusion 07/10/2019  . Postoperative pain   . Acute blood loss anemia   .  Labile blood pressure   . Tobacco abuse   . Hypoalbuminemia due to protein-calorie malnutrition ([place])   . Anxiety 07/05/2019  . Depression 07/05/2019  . Chronic diastolic CHF (congestive heart failure) ([place]) 07/05/2019  . Obesity (BMI 30-39.9) 07/05/2019  . ETOH abuse 07/05/2019  . Lumbar radiculopathy 06/28/2019  . Spinal stenosis of lumbar region 06/28/2019  . Acute on chronic anemia   . Tremors of nervous system   . AKI (acute kidney injury) ([place])   . Chronic pain syndrome   . Acute renal failure superimposed on stage 3 chronic kidney disease ([place]) 06/25/2019  . Acute osteomyelitis of lumbar spine ([place]) 06/25/2019  . Weakness of left lower extremity   . Abdominal pain 06/24/2019  . Nausea 06/24/2019  . Abnormal urine odor 06/24/2019  . History of sepsis 06/24/2019  . Recent major surgery 06/24/2019  . History of diverticulosis 06/24/2019  . Elevated PTHrP level 05/09/2019  . Paresthesia 02/12/2019  . Low back pain 02/12/2019  . Neuropathy 12/20/2018  . Lumbar spinal stenosis 06/29/2018  . Preoperative clearance 06/04/2018  . Abnormal CT of the chest 06/04/2018  . Smoker 05/17/2018  . Hypothyroidism 11/30/2017  . Thoracic aortic atherosclerosis ([place]) 09/14/2016  . Benign essential tremor 09/09/2016  . RLS (restless legs syndrome) 09/09/2016  . H/O adenomatous polyp of colon 07/06/2016  . PVC's (premature ventricular contractions) 03/29/2016  . Preoperative cardiovascular examination 03/29/2016  . Grade I diastolic dysfunction 16/01/9603  . Palpitations 01/11/2016  . Shortness of breath 01/11/2016  . Former cigarette smoker 01/11/2016  . Leg edema 01/11/2016  . Chronic cough 10/28/2015  . 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 Malignant neoplasm of upper-inner quadrant of left breast in female, estrogen receptor positive ([place]) 07/14/2015  . Prediabetes 07/08/2015  . CKD (chronic kidney disease), stage III 07/08/2015  . Dependent edema 06/08/2015  . Sleep  disturbance 06/08/2015  . Mixed hyperlipidemia 06/08/2015  . Generalized anxiety disorder 06/08/2015  . Essential hypertension 06/08/2015  . COPD (chronic obstructive pulmonary disease) ([place]) 06/08/2015  . Gout 06/08/2015  . Macrocytic anemia   . 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 Sepsis ([place]) 05/15/2015  . Lumbar spondylosis 03/20/2015    Dorene Ar, PTA 03/02/2020, 11:56 AM  [place], [place], [address] Phone:  [phone]   Fax:  [phone]  Name: LORALAI EISMAN MRN: [id number] Date of Birth: 1946-01-14

## 2020-03-06 ENCOUNTER — Encounter: Payer: PPO | Admitting: Physical Therapy

## 2020-03-09 NOTE — Progress Notes (Signed)
I have reviewed and agreed above plan. 

## 2020-03-10 ENCOUNTER — Other Ambulatory Visit: Payer: Self-pay

## 2020-03-10 ENCOUNTER — Telehealth (INDEPENDENT_AMBULATORY_CARE_PROVIDER_SITE_OTHER): Payer: PPO | Admitting: Internal Medicine

## 2020-03-10 DIAGNOSIS — M4626 Osteomyelitis of vertebra, lumbar region: Secondary | ICD-10-CM | POA: Diagnosis not present

## 2020-03-10 NOTE — Progress Notes (Signed)
Virtual Visit via Video Note  I connected with Brandi Stephens on 03/10/20 at  4:00 PM EST by a video enabled telemedicine application and verified that I am speaking with the correct person using two identifiers.  Location: Patient: Home Provider: [facility]   I discussed the limitations of evaluation and management by telemedicine and the availability of in person appointments. The patient expressed understanding and agreed to proceed.  History of Present Illness: I conducted a video visit with Brandi Stephens today.  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  She feels like she is tolerating it better now.  She is no longer having diarrhea.  Her recent C. difficile testing was negative.  Her back pain is a little bit better.  She has not had any other falls since her last visit.  She is currently at her [place] house at [facility] and will be celebrating Thanksgiving with her family there.   Observations/Objective: Sed Rate (mm/h)  Date Value  02/18/2020 22  10/29/2019 19  09/24/2019 29   CRP (mg/L)  Date Value  02/18/2020 15.6 (H)  10/29/2019 9.2 (H)  09/24/2019 17.8 (H)  02/12/2019 <1    Assessment and Plan: I discussed the relative pros and cons of continuing chronic suppressive cephalexin with her again today.  She is in favor of continuing cephalexin for now.  She will follow-up here in 2 months.  Follow Up Instructions: Continue cephalexin Follow-up here for repeat lab work in 2 months   I discussed the assessment and treatment plan with the patient. The patient was provided an opportunity to ask questions and all were answered. The patient agreed with the plan and demonstrated an understanding of the instructions.   The patient was advised to call back or seek an in-person evaluation if the symptoms worsen or if the condition fails to improve as anticipated.  I provided 15 minutes of non-face-to-face time  during this encounter.   [name], MD

## 2020-03-17 ENCOUNTER — Ambulatory Visit: Payer: PPO | Admitting: Physical Therapy

## 2020-03-17 ENCOUNTER — Encounter: Payer: Self-pay | Admitting: Physical Therapy

## 2020-03-17 ENCOUNTER — Other Ambulatory Visit: Payer: Self-pay

## 2020-03-17 VITALS — HR 111

## 2020-03-17 DIAGNOSIS — M545 Low back pain, unspecified: Secondary | ICD-10-CM | POA: Diagnosis not present

## 2020-03-17 DIAGNOSIS — R2689 Other abnormalities of gait and mobility: Secondary | ICD-10-CM

## 2020-03-17 DIAGNOSIS — M6281 Muscle weakness (generalized): Secondary | ICD-10-CM

## 2020-03-17 NOTE — Therapy (Signed)
[place], [place], [address] Phone: [phone]   Fax:  [phone]  Physical Therapy Treatment/Recertification Progress Note Reporting Period 03/17/2020 to 04/28/2020  See note below for Objective Data and Assessment of Progress/Goals.           Patient Details  Name: Brandi Stephens MRN: [id number] Date of Birth: 12-13-1945 Referring Provider (PT): Leeroy Cha, MD   Encounter Date: 03/17/2020   PT End of Session - 03/17/20 1450    Visit Number 18    Number of Visits 29    Date for PT Re-Evaluation 04/28/20    Authorization Type [place] [employer], next progress note by visit 28    PT Start Time 1418    PT Stop Time 1457    PT Time Calculation (min) 39 min    Activity Tolerance Patient tolerated treatment well    Behavior During Therapy WFL for tasks assessed/performed           Past Medical History:  Diagnosis Date  . AIN (anal intraepithelial neoplasia) anal canal   . Anemia    with pregnancy  . Anxiety   . Arthritis    knees, lower back, knees  . Chronic constipation   . Chronic diastolic CHF (congestive heart failure) ([place]) 07/05/2019  . CKD (chronic kidney disease)   . Coarse tremors    Essential  . COPD (chronic obstructive pulmonary disease) ([place])    2017 chest CT, history of  . Degenerative lumbar spinal stenosis   . Depression   . Depression 07/05/2019  . Drug dependency ([place])   . Elevated PTHrP level 05/09/2019  . ETOH abuse 07/05/2019  . GERD (gastroesophageal reflux disease)    pt denies  . Gout   . Grade I diastolic dysfunction 31/49/7026   Noted ECHO  . Hemorrhoids   . History of adenomatous polyp of colon   . History of basal cell carcinoma (BCC) excision 2015   nose  . History of sepsis 05/14/2014   post lumbar surgery  . History of shingles    x2  . Hyperlipidemia   . Hypertension   . Hypothyroidism   . Irregular heart beat    history of while undergoing  radiation  . Malignant neoplasm of upper-inner quadrant of left breast in female, estrogen receptor positive [facility]) oncologist-  dr Jana Hakim--  per lov in epic no recurrence   dx 07-13-2015  Left breast invasive ductal carcinoma, Stage IA, Grade 1 (TXN0),  09-16-2015  s/p  left breast lumpectomy with sln bx's,  completed radiation 11-18-2015,  started antiestrogen therapy 12-14-2015  . Neuropathy    Left foot and back of left leg  . Obesity (BMI 30-39.9) 07/05/2019  . Personal history of radiation therapy    completed 11-18-2015  left breast  . Pneumonia   . PONV (postoperative nausea and vomiting)   . Prediabetes    diet controlled, no med  . Restless leg syndrome   . Seasonal allergies   . Seizure ([place]) 05/2015   due to sepsis, only one time episode  . Tremor   . Wears partial dentures    lower     Past Surgical History:  Procedure Laterality Date  . ABDOMINAL HYSTERECTOMY  1985  . BASAL CELL CARCINOMA EXCISION  10/15   nose  . BREAST BIOPSY Right 08/24/2015  . BREAST BIOPSY Right 07/13/2015  . BREAST BIOPSY Left 07/13/2015  . BREAST EXCISIONAL BIOPSY Left 1972  . BREAST LUMPECTOMY Left   .  BREAST LUMPECTOMY WITH RADIOACTIVE SEED AND SENTINEL LYMPH NODE BIOPSY Left 09/16/2015   Procedure: BREAST LUMPECTOMY WITH RADIOACTIVE SEED AND SENTINEL LYMPH NODE BIOPSY;  Surgeon: Stark Klein, MD;  Location: [place];  Service: General;  Laterality: Left;  . BREAST REDUCTION SURGERY Bilateral 1998  . CATARACT EXTRACTION W/ INTRAOCULAR LENS IMPLANT Left 2016  . [place]   hallo  . COLONOSCOPY  01/2013   polyps  . EYE SURGERY Left 2016  . HAND SURGERY Left 02-19-2002   dr Fredna Dow  '@[facility]'    repair collateral ligament/ MPJ left thumb  . HARDWARE REMOVAL N/A 07/24/2019   Procedure: REVISION OF HARDWARE Lumbar two - Sacral one. Extention of Fusion to Lumbar one;  Surgeon: [name], MD;  Location: [place];  Service: Neurosurgery;  Laterality: N/A;  .  HEMORRHOID SURGERY  2008  . HIGH RESOLUTION ANOSCOPY N/A 02/28/2018   Procedure: HIGH RESOLUTION ANOSCOPY WITH BIOPSY;  Surgeon: Leighton Ruff, MD;  Location: [facility];  Service: General;  Laterality: N/A;  . KNEE ARTHROSCOPY Right 2002  . LUMBAR SPINE SURGERY  03-20-2015   dr Kathyrn Sheriff   fusion L4-5, L5-S1  . MOUTH SURGERY  07/14/2018   2 infected teeth removed  . MULTIPLE TOOTH EXTRACTIONS     with bone grafting lower bottom right and left  . REDUCTION MAMMAPLASTY Bilateral   . RIGHT COLECTOMY  09-10-2003   dr Margot Chimes '@[email or web address]'    multiple colon polyps (per path tubular adenoma's, hyperplastic , benign appendix, benign two lymph nodes  . ROTATOR CUFF REPAIR Left 04/2016  . TONSILLECTOMY  1969  . TUBAL LIGATION Bilateral 1982    Vitals:   03/17/20 1425  Pulse: (!) 111  SpO2: 94%     Subjective Assessment - 03/17/20 1420    Subjective Pt. returns, not seen for therapy for the past 2 weeks-she had been having issues with a number of recent falls with LE weakness and concern for recurrent/continued infection. She has had MD follow ups and continues on antibiotics to address. No recent falls reported since last therapy visit. LBP 4/10 this PM. Pt. is currently able to ambulate at home independently without AD though with some tendency she reports of holding onto furniture for support. She continues with RW use for community mobility-she has been able to progress gait with SPC in therapy sessions but some setback with more recent falls and weakness but feeling improved from recent status.    Pertinent History L4-S1 fusion 03/20/15, L2-3 fusion 05/30/19, breast CA history 2017, seizures (due to sepsis),  lumbar hardware removal 07/24/19 with subsequent staph infection/osteomyelitis, CHF, COPD, chronic pain in pain management, history ETOH abuse, neuropathy, intention tremor    Limitations Sitting;House hold activities;Lifting;Standing;Walking    Currently in Pain? Yes    Pain Score 4      Pain Location Back    Pain Orientation Lower    Pain Type Chronic pain    Pain Onset More than a month ago    Pain Frequency Constant    Aggravating Factors  falls, prolonged standing and walking    Pain Relieving Factors ice, medication, topical ointment    Effect of Pain on Daily Activities limits standing and walking tolerance              [facility] PT Assessment - 03/17/20 0001      Strength   Right Hip Flexion 4+/5    Right Hip External Rotation  4+/5    Right Hip Internal  Rotation 5/5    Left Hip Flexion 4+/5    Left Hip External Rotation 5/5    Left Hip Internal Rotation 5/5    Right Knee Flexion 5/5    Right Knee Extension 5/5    Left Knee Flexion 5/5    Left Knee Extension 5/5    Right Ankle Dorsiflexion 4/5    Right Ankle Eversion 4/5    Left Ankle Dorsiflexion 4/5    Left Ankle Eversion 4/5      Transfers   Five time sit to stand comments  10.8      Standardized Balance Assessment   Standardized Balance Assessment --   Tinetti Gait + Balance 20/28     Timed Up and Go Test   Normal TUG (seconds) 9                         [facility] Adult PT Treatment/Exercise - 03/17/20 0001      Lumbar Exercises: Stretches   Passive Hamstring Stretch Right;Left;3 reps;30 seconds      Lumbar Exercises: Aerobic   Nustep L5 x 5 min UE/LE      Lumbar Exercises: Standing   Other Standing Lumbar Exercises 6 in. step up at counter x 20 ea. bilat. with bilat. UE support      Lumbar Exercises: Supine   Clam 20 reps    Clam Limitations blue band    Dead Bug 15 reps    Bridge with [employer] 15 reps    Straight Leg Raise 10 reps    Straight Leg Raises Limitations with abdominal draw in     Isometric Hip Flexion 10 reps;3 seconds    Isometric Hip Flexion Limitations bilat.                  PT Education - 03/17/20 1459    Education Details POC, graded activity progression    Person(s) Educated Patient    Methods Explanation    Comprehension  Verbalized understanding            PT Short Term Goals - 01/31/20 1200      PT SHORT TERM GOAL #1   Title Independent with HEP    Time 3    Period Weeks    Status Achieved      PT SHORT TERM GOAL #2   Title Improve TUG time (with RW) at least 3-5 sec for decreased fall risk    Baseline 11 sec    Time 3    Period Weeks    Status Achieved      PT SHORT TERM GOAL #3   Title Ambulate at least 100 feet CGA with SPC for progression to LTG for gait    Baseline met/able    Time 3    Period Weeks    Status Achieved             PT Long Term Goals - 03/17/20 1431      PT LONG TERM GOAL #1   Title Improve Tinetti Gait + Balance at least 3-5 points for decreased fall risk    Baseline 20/28    Time 6    Period Weeks    Status On-going    Target Date 04/28/20      PT LONG TERM GOAL #2   Title Increase left hip and ankle strength at least grossly 1/2 MMT grade to improve ability to ascend/descend stairs to 2nd floor bedroom    Baseline see  objective-partially met    Time 6    Period Weeks    Status Partially Met    Target Date 04/28/20      PT LONG TERM GOAL #3   Title Perform household level ambulation 150 feet or less mod I with SPC vs. LRAD    Baseline reports ambulating without AD 80% of the time at home, progressing    Time 6    Period Weeks    Status On-going    Target Date 04/28/20      PT LONG TERM GOAL #4   Title Improve 5 times sit<>stand time to 14 sec or less for improved ability for transfers from low seats    Baseline 10.8 sec    Time 6    Period Weeks    Status Achieved      PT LONG TERM GOAL #5   Title Tolerate standing for periods at least 15-20 min for cooking and light chores with LBP 4/10 or less    Baseline 4/10 pre-tx. but intermittently higher with activity    Time 6    Period Weeks    Status On-going    Target Date 04/28/20                 Plan - 03/17/20 1556    Clinical Impression Statement Pt. had experienced some setback  with therapy during current authorization period with multiple falls and ongoing issues with infection but returns today noting improvement from recent previous status with improved walking ability/no further falls. Still with ongoing chronic LBP issues but pain less severe today. She has made good progress with TUG time and 5 times sit<>stand. For gait she is progressing with decreased need for AD though continue to advise use for now given recent falls. Deconditioning is still a signiifcant factor limiting activity tolerance and ability to progress gait distances in clinic with gait training. Tinetti Score still suggestive of increased fall risk as well. Plan resume/continue therapy for further progress to improve functional activity tolerance and safety with mobility and work towards decreased fall risk.    Personal Factors and Comorbidities Comorbidity 3+;Time since onset of injury/illness/exacerbation;Age   setback with recent falls   Comorbidities see PMH    Examination-Activity Limitations Sit;Dressing;Bathing;Transfers;Sleep;Hygiene/Grooming;Bed Mobility;Bend;Lift;Squat;Stairs;Locomotion Level;Carry;Stand;Continence;Toileting    Examination-Participation Restrictions Community Activity;Shop;Cleaning;Laundry;Meal Prep    Stability/Clinical Decision Making Evolving/Moderate complexity    Clinical Decision Making Moderate    Rehab Potential Good    PT Frequency 2x / week    PT Duration 6 weeks    PT Treatment/Interventions ADLs/Self Care Home Management;Cryotherapy;Moist Heat;Therapeutic activities;Gait training;Therapeutic exercise;Patient/family education;Manual techniques;Stair training;Functional mobility training;Balance training;Neuromuscular re-education;Taping    PT Next Visit Plan continue to monitor vitals as needed, pending pain and fatigue continue exercise progression with step up variations, closed chain strengthening for LE, balance, core/back strengthening    PT Home Exercise Plan  Access Code: [email or web address]: [email or web address]: 08/31/2021Prepared by: Caesar Bookman Squat with Counter Support - 2 x daily - 7 x weekly - 2 sets - 10 repsStanding March with Counter Support - 2 x daily - 7 x weekly - 3 sets - 10 repsStanding Heel Raise with Support - 2 x daily - 7 x weekly - 3 sets - 10 repsStanding Hip Abduction with Counter Support - 2 x daily - 7 x weekly - 3 sets - 10 reps    Consulted and Agree with Plan of Care Patient           Patient will benefit from skilled  therapeutic intervention in order to improve the following deficits and impairments:  Abnormal gait, Decreased balance, Decreased endurance, Difficulty walking, Impaired sensation, Pain, Impaired flexibility, Decreased strength, Decreased activity tolerance  Visit Diagnosis: Chronic bilateral low back pain, unspecified whether sciatica present  Muscle weakness (generalized)  Other abnormalities of gait and mobility     Problem List Patient Active Problem List   Diagnosis Date Noted  . Short-term memory loss 02/24/2020  . Peripheral edema 02/24/2020  . Osteopenia 01/22/2020  . Aortic atherosclerosis ([place]) 01/22/2020  . Wheezing 01/22/2020  . Diarrhea 01/22/2020  . Insomnia 10/29/2019  . Recurrent falls 09/06/2019  . Confusion, postoperative   . Benign essential HTN   . Post-operative pain   . Chronic constipation   . 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 Post op infection 07/11/2019  . Muscle spasm   . Hypokalemia   . Status post lumbar spinal fusion 07/10/2019  . Postoperative pain   . Acute blood loss anemia   . Labile blood pressure   . Tobacco abuse   . Hypoalbuminemia due to protein-calorie malnutrition ([place])   . Anxiety 07/05/2019  . Depression 07/05/2019  . Chronic diastolic CHF (congestive heart failure) ([place]) 07/05/2019  . Obesity (BMI 30-39.9) 07/05/2019  . ETOH abuse 07/05/2019  . Lumbar radiculopathy 06/28/2019  . Spinal stenosis of lumbar  region 06/28/2019  . Acute on chronic anemia   . Tremors of nervous system   . AKI (acute kidney injury) ([place])   . Chronic pain syndrome   . Acute renal failure superimposed on stage 3 chronic kidney disease ([place]) 06/25/2019  . Acute osteomyelitis of lumbar spine ([place]) 06/25/2019  . Weakness of left lower extremity   . Abdominal pain 06/24/2019  . Nausea 06/24/2019  . Abnormal urine odor 06/24/2019  . History of sepsis 06/24/2019  . Recent major surgery 06/24/2019  . History of diverticulosis 06/24/2019  . Elevated PTHrP level 05/09/2019  . Paresthesia 02/12/2019  . Low back pain 02/12/2019  . Neuropathy 12/20/2018  . Lumbar spinal stenosis 06/29/2018  . Preoperative clearance 06/04/2018  . Abnormal CT of the chest 06/04/2018  . Smoker 05/17/2018  . Hypothyroidism 11/30/2017  . Thoracic aortic atherosclerosis ([place]) 09/14/2016  . Benign essential tremor 09/09/2016  . RLS (restless legs syndrome) 09/09/2016  . H/O adenomatous polyp of colon 07/06/2016  . PVC's (premature ventricular contractions) 03/29/2016  . Preoperative cardiovascular examination 03/29/2016  . Grade I diastolic dysfunction 87/86/7672  . Palpitations 01/11/2016  . Shortness of breath 01/11/2016  . Former cigarette smoker 01/11/2016  . Leg edema 01/11/2016  . Chronic cough 10/28/2015  . 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 Malignant neoplasm of upper-inner quadrant of left breast in female, estrogen receptor positive ([place]) 07/14/2015  . Prediabetes 07/08/2015  . CKD (chronic kidney disease), stage III 07/08/2015  . Dependent edema 06/08/2015  . Sleep disturbance 06/08/2015  . Mixed hyperlipidemia 06/08/2015  . Generalized anxiety disorder 06/08/2015  . Essential hypertension 06/08/2015  . COPD (chronic obstructive pulmonary disease) ([place]) 06/08/2015  . Gout 06/08/2015  . Macrocytic anemia   . 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 Sepsis ([place]) 05/15/2015  . Lumbar spondylosis 03/20/2015     Beaulah Dinning, PT, DPT 03/17/20 4:06 PM  [facility] [address] [place], [place], [address] Phone: [phone]   Fax:  [phone]  Name: CHEN HOLZMAN MRN: [id number] Date of Birth: 09-18-1945

## 2020-03-18 ENCOUNTER — Other Ambulatory Visit: Payer: Self-pay | Admitting: Physical Medicine and Rehabilitation

## 2020-03-19 MED ORDER — HYDROCODONE-ACETAMINOPHEN 5-325 MG PO TABS: 1.0000 | ORAL_TABLET | Freq: Every day | ORAL | 0 refills | Status: DC | PRN

## 2020-03-19 MED ORDER — DIAZEPAM 5 MG PO TABS: 5.0000 mg | ORAL_TABLET | Freq: Every day | ORAL | 0 refills | Status: DC | PRN

## 2020-03-19 NOTE — Addendum Note (Signed)
Addended by: Izora Ribas on: 03/19/2020 10:20 AM   Modules accepted: Orders

## 2020-03-20 ENCOUNTER — Ambulatory Visit: Payer: PPO | Admitting: Physical Therapy

## 2020-03-23 ENCOUNTER — Other Ambulatory Visit: Payer: Self-pay

## 2020-03-23 ENCOUNTER — Ambulatory Visit: Payer: PPO | Attending: Physical Medicine and Rehabilitation | Admitting: Physical Therapy

## 2020-03-23 ENCOUNTER — Encounter: Payer: Self-pay | Admitting: Physical Therapy

## 2020-03-23 DIAGNOSIS — G8929 Other chronic pain: Secondary | ICD-10-CM | POA: Insufficient documentation

## 2020-03-23 DIAGNOSIS — M5416 Radiculopathy, lumbar region: Secondary | ICD-10-CM | POA: Diagnosis not present

## 2020-03-23 DIAGNOSIS — R2689 Other abnormalities of gait and mobility: Secondary | ICD-10-CM | POA: Diagnosis not present

## 2020-03-23 DIAGNOSIS — M6281 Muscle weakness (generalized): Secondary | ICD-10-CM | POA: Insufficient documentation

## 2020-03-23 DIAGNOSIS — M545 Low back pain, unspecified: Secondary | ICD-10-CM | POA: Diagnosis not present

## 2020-03-23 NOTE — Therapy (Signed)
[place], [place], [address] Phone: [phone]   Fax:  [phone]  Physical Therapy Treatment  Patient Details  Name: Brandi Stephens MRN: [id number] Date of Birth: [birth year] Referring Provider (PT): Leeroy Cha, MD   Encounter Date: 03/23/2020   PT End of Session - 03/23/20 1324    Visit Number 19    Number of Visits 29    Date for PT Re-Evaluation 04/28/20    Authorization Type HA Medicare, next progress note by visit 28    PT Start Time 1320    PT Stop Time 1400    PT Time Calculation (min) 40 min           Past Medical History:  Diagnosis Date  . AIN (anal intraepithelial neoplasia) anal canal   . Anemia    with pregnancy  . Anxiety   . Arthritis    knees, lower back, knees  . Chronic constipation   . Chronic diastolic CHF (congestive heart failure) ([place]) 07/05/2019  . CKD (chronic kidney disease)   . Coarse tremors    Essential  . COPD (chronic obstructive pulmonary disease) ([place])    2017 chest CT, history of  . Degenerative lumbar spinal stenosis   . Depression   . Depression 07/05/2019  . Drug dependency ([place])   . Elevated PTHrP level 05/09/2019  . ETOH abuse 07/05/2019  . GERD (gastroesophageal reflux disease)    pt denies  . Gout   . Grade I diastolic dysfunction 32/99/2426   Noted ECHO  . Hemorrhoids   . History of adenomatous polyp of colon   . History of basal cell carcinoma (BCC) excision 2015   nose  . History of sepsis 05/14/2014   post lumbar surgery  . History of shingles    x2  . Hyperlipidemia   . Hypertension   . Hypothyroidism   . Irregular heart beat    history of while undergoing radiation  . Malignant neoplasm of upper-inner quadrant of left breast in female, estrogen receptor positive [facility]) oncologist-  dr Jana Hakim--  per lov in epic no recurrence   dx 07-13-2015  Left breast invasive ductal carcinoma, Stage IA, Grade 1 (TXN0),  09-16-2015  s/p   left breast lumpectomy with sln bx's,  completed radiation 11-18-2015,  started antiestrogen therapy 12-14-2015  . Neuropathy    Left foot and back of left leg  . Obesity (BMI 30-39.9) 07/05/2019  . Personal history of radiation therapy    completed 11-18-2015  left breast  . Pneumonia   . PONV (postoperative nausea and vomiting)   . Prediabetes    diet controlled, no med  . Restless leg syndrome   . Seasonal allergies   . Seizure ([place]) 05/2015   due to sepsis, only one time episode  . Tremor   . Wears partial dentures    lower     Past Surgical History:  Procedure Laterality Date  . ABDOMINAL HYSTERECTOMY  1985  . BASAL CELL CARCINOMA EXCISION  10/15   nose  . BREAST BIOPSY Right 08/24/2015  . BREAST BIOPSY Right 07/13/2015  . BREAST BIOPSY Left 07/13/2015  . BREAST EXCISIONAL BIOPSY Left 1972  . BREAST LUMPECTOMY Left   . BREAST LUMPECTOMY WITH RADIOACTIVE SEED AND SENTINEL LYMPH NODE BIOPSY Left 09/16/2015   Procedure: BREAST LUMPECTOMY WITH RADIOACTIVE SEED AND SENTINEL LYMPH NODE BIOPSY;  Surgeon: Stark Klein, MD;  Location: [facility];  Service: General;  Laterality: Left;  . BREAST  REDUCTION SURGERY Bilateral 1998  . CATARACT EXTRACTION W/ INTRAOCULAR LENS IMPLANT Left 2016  . [place]   hallo  . COLONOSCOPY  01/2013   polyps  . EYE SURGERY Left 2016  . HAND SURGERY Left 02-19-2002   dr Fredna Dow  _0    repair collateral ligament/ MPJ left thumb  . HARDWARE REMOVAL N/A 07/24/2019   Procedure: REVISION OF HARDWARE Lumbar two - Sacral one. Extention of Fusion to Lumbar one;  Surgeon: [name], MD;  Location: [facility];  Service: Neurosurgery;  Laterality: N/A;  . HEMORRHOID SURGERY  2008  . HIGH RESOLUTION ANOSCOPY N/A 02/28/2018   Procedure: HIGH RESOLUTION ANOSCOPY WITH BIOPSY;  Surgeon: Leighton Ruff, MD;  Location: [facility];  Service: General;  Laterality: N/A;  . KNEE ARTHROSCOPY Right 2002  . LUMBAR SPINE  SURGERY  03-20-2015   dr Kathyrn Sheriff   fusion L4-5, L5-S1  . MOUTH SURGERY  07/14/2018   2 infected teeth removed  . MULTIPLE TOOTH EXTRACTIONS     with bone grafting lower bottom right and left  . REDUCTION MAMMAPLASTY Bilateral   . RIGHT COLECTOMY  09-10-2003   dr Margot Chimes _1    multiple colon polyps (per path tubular adenoma's, hyperplastic , benign appendix, benign two lymph nodes  . ROTATOR CUFF REPAIR Left 04/2016  . TONSILLECTOMY  1969  . TUBAL LIGATION Bilateral 1982    There were no vitals filed for this visit.   Subjective Assessment - 03/23/20 1322    Currently in Pain? Yes    Pain Score 6     Pain Location Back    Pain Orientation Lower    Pain Descriptors / Indicators Aching    Pain Type Chronic pain                             [facility] Adult PT Treatment/Exercise - 03/23/20 0001      Lumbar Exercises: Aerobic   Nustep L5 x 5 min UE/LE      Lumbar Exercises: Standing   Heel Raises 15 reps    Row AROM;Strengthening;15 reps    Theraband Level (Row) Level 4 (Blue)    Other Standing Lumbar Exercises abdominal press with physioball in chair x 10     Other Standing Lumbar Exercises tandem stance in parallel bars , side stepping in parallel bars- cues for pacing and breathing, stanidng atlernating march with abdominal draw in       Lumbar Exercises: Seated   Other Seated Lumbar Exercises Seated abdominal ball press (physioball in chair) x 10, Seated bilateral heel slides with towel x 15 for lower abdominals, seated roll back/up x 10 , seated march with knee touch to hand, seated posterior pelvic tilit with ball squeeze                    PT Short Term Goals - 01/31/20 1200      PT SHORT TERM GOAL #1   Title Independent with HEP    Time 3    Period Weeks    Status Achieved      PT SHORT TERM GOAL #2   Title Improve TUG time (with RW) at least 3-5 sec for decreased fall risk    Baseline 11 sec    Time 3    Period Weeks    Status  Achieved      PT SHORT TERM GOAL #3   Title Ambulate at least 100  feet CGA with SPC for progression to LTG for gait    Baseline met/able    Time 3    Period Weeks    Status Achieved             PT Long Term Goals - 03/17/20 1431      PT LONG TERM GOAL #1   Title Improve Tinetti Gait + Balance at least 3-5 points for decreased fall risk    Baseline 20/28    Time 6    Period Weeks    Status On-going    Target Date 04/28/20      PT LONG TERM GOAL #2   Title Increase left hip and ankle strength at least grossly 1/2 MMT grade to improve ability to ascend/descend stairs to 2nd floor bedroom    Baseline see objective-partially met    Time 6    Period Weeks    Status Partially Met    Target Date 04/28/20      PT LONG TERM GOAL #3   Title Perform household level ambulation 150 feet or less mod I with SPC vs. LRAD    Baseline reports ambulating without AD 80% of the time at home, progressing    Time 6    Period Weeks    Status On-going    Target Date 04/28/20      PT LONG TERM GOAL #4   Title Improve 5 times sit<>stand time to 14 sec or less for improved ability for transfers from low seats    Baseline 10.8 sec    Time 6    Period Weeks    Status Achieved      PT LONG TERM GOAL #5   Title Tolerate standing for periods at least 15-20 min for cooking and light chores with LBP 4/10 or less    Baseline 4/10 pre-tx. but intermittently higher with activity    Time 6    Period Weeks    Status On-going    Target Date 04/28/20                 Plan - 03/23/20 1408    Clinical Impression Statement Pt reports mat therex , specifically abdominal/pelvic tiliting causes significant back pain after therapy session. Worked on abdominal strength in standing and sitting today. Vitals were within normal when she is sitting. SP02 decreased to 80% in parallel bars with side stepping. AFter cues for breathing and pacing with rest breaks SPO2 remained 90% and above. HR response normal  during session today.    PT Next Visit Plan continue to monitor vitals as needed, pending pain and fatigue continue exercise progression with step up variations, closed chain strengthening for LE, balance, core/back strengthening    PT Home Exercise Plan Access Code: [email or web address]: [email or web address]: 08/31/2021Prepared by: Caesar Bookman Squat with Counter Support - 2 x daily - 7 x weekly - 2 sets - 10 repsStanding March with Counter Support - 2 x daily - 7 x weekly - 3 sets - 10 repsStanding Heel Raise with Support - 2 x daily - 7 x weekly - 3 sets - 10 repsStanding Hip Abduction with Counter Support - 2 x daily - 7 x weekly - 3 sets - 10 reps           Patient will benefit from skilled therapeutic intervention in order to improve the following deficits and impairments:  Abnormal gait, Decreased balance, Decreased endurance, Difficulty walking, Impaired sensation, Pain, Impaired flexibility, Decreased strength, Decreased activity tolerance  Visit Diagnosis:  Chronic bilateral low back pain, unspecified whether sciatica present  Muscle weakness (generalized)  Other abnormalities of gait and mobility  Lumbar radiculopathy     Problem List Patient Active Problem List   Diagnosis Date Noted  . Short-term memory loss 02/24/2020  . Peripheral edema 02/24/2020  . Osteopenia 01/22/2020  . Aortic atherosclerosis ([place]) 01/22/2020  . Wheezing 01/22/2020  . Diarrhea 01/22/2020  . Insomnia 10/29/2019  . Recurrent falls 09/06/2019  . Confusion, postoperative   . Benign essential HTN   . Post-operative pain   . Chronic constipation   . 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 Post op infection 07/11/2019  . Muscle spasm   . Hypokalemia   . Status post lumbar spinal fusion 07/10/2019  . Postoperative pain   . Acute blood loss anemia   . Labile blood pressure   . Tobacco abuse   . Hypoalbuminemia due to protein-calorie malnutrition ([place])   . Anxiety  07/05/2019  . Depression 07/05/2019  . Chronic diastolic CHF (congestive heart failure) ([place]) 07/05/2019  . Obesity (BMI 30-39.9) 07/05/2019  . ETOH abuse 07/05/2019  . Lumbar radiculopathy 06/28/2019  . Spinal stenosis of lumbar region 06/28/2019  . Acute on chronic anemia   . Tremors of nervous system   . AKI (acute kidney injury) ([place])   . Chronic pain syndrome   . Acute renal failure superimposed on stage 3 chronic kidney disease ([place]) 06/25/2019  . Acute osteomyelitis of lumbar spine ([place]) 06/25/2019  . Weakness of left lower extremity   . Abdominal pain 06/24/2019  . Nausea 06/24/2019  . Abnormal urine odor 06/24/2019  . History of sepsis 06/24/2019  . Recent major surgery 06/24/2019  . History of diverticulosis 06/24/2019  . Elevated PTHrP level 05/09/2019  . Paresthesia 02/12/2019  . Low back pain 02/12/2019  . Neuropathy 12/20/2018  . Lumbar spinal stenosis 06/29/2018  . Preoperative clearance 06/04/2018  . Abnormal CT of the chest 06/04/2018  . Smoker 05/17/2018  . Hypothyroidism 11/30/2017  . Thoracic aortic atherosclerosis ([place]) 09/14/2016  . Benign essential tremor 09/09/2016  . RLS (restless legs syndrome) 09/09/2016  . H/O adenomatous polyp of colon 07/06/2016  . PVC's (premature ventricular contractions) 03/29/2016  . Preoperative cardiovascular examination 03/29/2016  . Grade I diastolic dysfunction 81/82/9937  . Palpitations 01/11/2016  . Shortness of breath 01/11/2016  . Former cigarette smoker 01/11/2016  . Leg edema 01/11/2016  . Chronic cough 10/28/2015  . 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 Malignant neoplasm of upper-inner quadrant of left breast in female, estrogen receptor positive ([place]) 07/14/2015  . Prediabetes 07/08/2015  . CKD (chronic kidney disease), stage III 07/08/2015  . Dependent edema 06/08/2015  . Sleep disturbance 06/08/2015  . Mixed hyperlipidemia 06/08/2015  . Generalized anxiety disorder 06/08/2015  . Essential  hypertension 06/08/2015  . COPD (chronic obstructive pulmonary disease) ([place]) 06/08/2015  . Gout 06/08/2015  . Macrocytic anemia   . 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 Sepsis ([place]) 05/15/2015  . Lumbar spondylosis 03/20/2015    Dorene Ar , PTA 03/23/2020, 2:13 PM  [facility] [address] [place], [place], [address] Phone: [phone]   Fax:  [phone]  Name: MEMPHIS CRESWELL MRN: [id number] Date of Birth: Oct 04, 1945

## 2020-03-25 ENCOUNTER — Other Ambulatory Visit: Payer: Self-pay | Admitting: Family Medicine

## 2020-03-27 ENCOUNTER — Ambulatory Visit: Payer: PPO | Admitting: Physical Therapy

## 2020-03-31 ENCOUNTER — Encounter: Payer: Self-pay | Admitting: Physical Therapy

## 2020-03-31 ENCOUNTER — Other Ambulatory Visit: Payer: Self-pay

## 2020-03-31 ENCOUNTER — Ambulatory Visit: Payer: PPO | Admitting: Physical Therapy

## 2020-03-31 DIAGNOSIS — M545 Low back pain, unspecified: Secondary | ICD-10-CM | POA: Diagnosis not present

## 2020-03-31 DIAGNOSIS — M6281 Muscle weakness (generalized): Secondary | ICD-10-CM

## 2020-03-31 DIAGNOSIS — R2689 Other abnormalities of gait and mobility: Secondary | ICD-10-CM

## 2020-03-31 DIAGNOSIS — M5416 Radiculopathy, lumbar region: Secondary | ICD-10-CM

## 2020-03-31 NOTE — Therapy (Signed)
[place], [place], [address] Phone: [phone]   Fax:  [phone]  Physical Therapy Treatment  Patient Details  Name: Brandi Stephens MRN: [id number] Date of Birth: 06/10/1945 Referring Provider (PT): Leeroy Cha, MD   Encounter Date: 03/31/2020   PT End of Session - 03/31/20 1126    Visit Number 20    Number of Visits 29    Date for PT Re-Evaluation 04/28/20    Authorization Type HA Medicare, next progress note by visit 28    PT Start Time 1102    PT Stop Time 1145    PT Time Calculation (min) 43 min           Past Medical History:  Diagnosis Date  . AIN (anal intraepithelial neoplasia) anal canal   . Anemia    with pregnancy  . Anxiety   . Arthritis    knees, lower back, knees  . Chronic constipation   . Chronic diastolic CHF (congestive heart failure) ([place]) 07/05/2019  . CKD (chronic kidney disease)   . Coarse tremors    Essential  . COPD (chronic obstructive pulmonary disease) ([place])    2017 chest CT, history of  . Degenerative lumbar spinal stenosis   . Depression   . Depression 07/05/2019  . Drug dependency ([place])   . Elevated PTHrP level 05/09/2019  . ETOH abuse 07/05/2019  . GERD (gastroesophageal reflux disease)    pt denies  . Gout   . Grade I diastolic dysfunction 03/47/4259   Noted ECHO  . Hemorrhoids   . History of adenomatous polyp of colon   . History of basal cell carcinoma (BCC) excision 2015   nose  . History of sepsis 05/14/2014   post lumbar surgery  . History of shingles    x2  . Hyperlipidemia   . Hypertension   . Hypothyroidism   . Irregular heart beat    history of while undergoing radiation  . Malignant neoplasm of upper-inner quadrant of left breast in female, estrogen receptor positive [facility]) oncologist-  dr Jana Hakim--  per lov in epic no recurrence   dx 07-13-2015  Left breast invasive ductal carcinoma, Stage IA, Grade 1 (TXN0),  09-16-2015  s/p   left breast lumpectomy with sln bx's,  completed radiation 11-18-2015,  started antiestrogen therapy 12-14-2015  . Neuropathy    Left foot and back of left leg  . Obesity (BMI 30-39.9) 07/05/2019  . Personal history of radiation therapy    completed 11-18-2015  left breast  . Pneumonia   . PONV (postoperative nausea and vomiting)   . Prediabetes    diet controlled, no med  . Restless leg syndrome   . Seasonal allergies   . Seizure ([place]) 05/2015   due to sepsis, only one time episode  . Tremor   . Wears partial dentures    lower     Past Surgical History:  Procedure Laterality Date  . ABDOMINAL HYSTERECTOMY  1985  . BASAL CELL CARCINOMA EXCISION  10/15   nose  . BREAST BIOPSY Right 08/24/2015  . BREAST BIOPSY Right 07/13/2015  . BREAST BIOPSY Left 07/13/2015  . BREAST EXCISIONAL BIOPSY Left 1972  . BREAST LUMPECTOMY Left   . BREAST LUMPECTOMY WITH RADIOACTIVE SEED AND SENTINEL LYMPH NODE BIOPSY Left 09/16/2015   Procedure: BREAST LUMPECTOMY WITH RADIOACTIVE SEED AND SENTINEL LYMPH NODE BIOPSY;  Surgeon: Stark Klein, MD;  Location: [facility];  Service: General;  Laterality: Left;  . BREAST  REDUCTION SURGERY Bilateral 1998  . CATARACT EXTRACTION W/ INTRAOCULAR LENS IMPLANT Left 2016  . [place]   hallo  . COLONOSCOPY  01/2013   polyps  . EYE SURGERY Left 2016  . HAND SURGERY Left 02-19-2002   dr Fredna Dow  '@[facility]'    repair collateral ligament/ MPJ left thumb  . HARDWARE REMOVAL N/A 07/24/2019   Procedure: REVISION OF HARDWARE Lumbar two - Sacral one. Extention of Fusion to Lumbar one;  Surgeon: [name], MD;  Location: [facility];  Service: Neurosurgery;  Laterality: N/A;  . HEMORRHOID SURGERY  2008  . HIGH RESOLUTION ANOSCOPY N/A 02/28/2018   Procedure: HIGH RESOLUTION ANOSCOPY WITH BIOPSY;  Surgeon: Leighton Ruff, MD;  Location: [facility];  Service: General;  Laterality: N/A;  . KNEE ARTHROSCOPY Right 2002  . LUMBAR SPINE  SURGERY  03-20-2015   dr Kathyrn Sheriff   fusion L4-5, L5-S1  . MOUTH SURGERY  07/14/2018   2 infected teeth removed  . MULTIPLE TOOTH EXTRACTIONS     with bone grafting lower bottom right and left  . REDUCTION MAMMAPLASTY Bilateral   . RIGHT COLECTOMY  09-10-2003   dr Margot Chimes '@[email or web address]'    multiple colon polyps (per path tubular adenoma's, hyperplastic , benign appendix, benign two lymph nodes  . ROTATOR CUFF REPAIR Left 04/2016  . TONSILLECTOMY  1969  . TUBAL LIGATION Bilateral 1982    There were no vitals filed for this visit.   Subjective Assessment - 03/31/20 1105    Subjective The back has been a mess this week. 7-8/10 sometimes. I felt great after the last session. I liked the exercises.    Currently in Pain? Yes    Pain Score 7     Pain Location Back    Pain Orientation Lower    Pain Descriptors / Indicators Aching    Pain Type Chronic pain    Aggravating Factors  prolonged, standing and walking    Pain Relieving Factors ice, meds, topical ointment                             [facility] Adult PT Treatment/Exercise - 03/31/20 0001      Lumbar Exercises: Aerobic   Nustep L5 x 5 min UE/LE      Lumbar Exercises: Seated   LAQ on Ball Limitations seated of stability disc- kicks then added opp UE raises    Other Seated Lumbar Exercises Seated abdominal ball press (physioball in chair) x 10, Seated bilateral heel slides with towel x 15 for lower abdominals, seated roll back/up x 10 , seated march with knee touch to hand, seated posterior pelvic tilit with ball squeeze   all x 2                 PT Education - 03/31/20 1153    Education Details HEP    Person(s) Educated Patient    Methods Explanation;Handout    Comprehension Verbalized understanding            PT Short Term Goals - 01/31/20 1200      PT SHORT TERM GOAL #1   Title Independent with HEP    Time 3    Period Weeks    Status Achieved      PT SHORT TERM GOAL #2   Title Improve TUG time (with  RW) at least 3-5 sec for decreased fall risk    Baseline 11 sec    Time 3  Period Weeks    Status Achieved      PT SHORT TERM GOAL #3   Title Ambulate at least 100 feet CGA with SPC for progression to LTG for gait    Baseline met/able    Time 3    Period Weeks    Status Achieved             PT Long Term Goals - 03/17/20 1431      PT LONG TERM GOAL #1   Title Improve Tinetti Gait + Balance at least 3-5 points for decreased fall risk    Baseline 20/28    Time 6    Period Weeks    Status On-going    Target Date 04/28/20      PT LONG TERM GOAL #2   Title Increase left hip and ankle strength at least grossly 1/2 MMT grade to improve ability to ascend/descend stairs to 2nd floor bedroom    Baseline see objective-partially met    Time 6    Period Weeks    Status Partially Met    Target Date 04/28/20      PT LONG TERM GOAL #3   Title Perform household level ambulation 150 feet or less mod I with SPC vs. LRAD    Baseline reports ambulating without AD 80% of the time at home, progressing    Time 6    Period Weeks    Status On-going    Target Date 04/28/20      PT LONG TERM GOAL #4   Title Improve 5 times sit<>stand time to 14 sec or less for improved ability for transfers from low seats    Baseline 10.8 sec    Time 6    Period Weeks    Status Achieved      PT LONG TERM GOAL #5   Title Tolerate standing for periods at least 15-20 min for cooking and light chores with LBP 4/10 or less    Baseline 4/10 pre-tx. but intermittently higher with activity    Time 6    Period Weeks    Status On-going    Target Date 04/28/20                 Plan - 03/31/20 1128    Clinical Impression Statement Pt reports increased back pain for unknow reason. She reports the exercises last session did not increased her back pain. She has been walking in her driveway with her RW. Session focused on seated core/abdominal strengthening. She reports no falls recently. Trial of seated  stability disc which she was able to perform UE/LE movement with stabilization of core. No c/o pain during therex today.    PT Next Visit Plan continue to monitor vitals as needed, pending pain and fatigue continue exercise progression with step up variations, closed chain strengthening for LE, balance, core/back strengthening    PT Home Exercise Plan Access Code: [email or web address]: [email or web address]: 08/31/2021Prepared by: Caesar Bookman Squat with Counter Support - 2 x daily - 7 x weekly - 2 sets - 10 repsStanding March with Counter Support - 2 x daily - 7 x weekly - 3 sets - 10 repsStanding Heel Raise with Support - 2 x daily - 7 x weekly - 3 sets - 10 repsStanding Hip Abduction with Counter Support - 2 x daily - 7 x weekly - 3 sets - 10 reps, added [id number]: seated lean backs and seated physioball press for abdominals           Patient  will benefit from skilled therapeutic intervention in order to improve the following deficits and impairments:  Abnormal gait,Decreased balance,Decreased endurance,Difficulty walking,Impaired sensation,Pain,Impaired flexibility,Decreased strength,Decreased activity tolerance  Visit Diagnosis: Chronic bilateral low back pain, unspecified whether sciatica present  Muscle weakness (generalized)  Other abnormalities of gait and mobility  Lumbar radiculopathy     Problem List Patient Active Problem List   Diagnosis Date Noted  . Short-term memory loss 02/24/2020  . Peripheral edema 02/24/2020  . Osteopenia 01/22/2020  . Aortic atherosclerosis ([place]) 01/22/2020  . Wheezing 01/22/2020  . Diarrhea 01/22/2020  . Insomnia 10/29/2019  . Recurrent falls 09/06/2019  . Confusion, postoperative   . Benign essential HTN   . Post-operative pain   . Chronic constipation   . 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 Post op infection 07/11/2019  . Muscle spasm   . Hypokalemia   . Status post lumbar spinal fusion 07/10/2019  .  Postoperative pain   . Acute blood loss anemia   . Labile blood pressure   . Tobacco abuse   . Hypoalbuminemia due to protein-calorie malnutrition ([place])   . Anxiety 07/05/2019  . Depression 07/05/2019  . Chronic diastolic CHF (congestive heart failure) ([place]) 07/05/2019  . Obesity (BMI 30-39.9) 07/05/2019  . ETOH abuse 07/05/2019  . Lumbar radiculopathy 06/28/2019  . Spinal stenosis of lumbar region 06/28/2019  . Acute on chronic anemia   . Tremors of nervous system   . AKI (acute kidney injury) ([place])   . Chronic pain syndrome   . Acute renal failure superimposed on stage 3 chronic kidney disease ([place]) 06/25/2019  . Acute osteomyelitis of lumbar spine ([place]) 06/25/2019  . Weakness of left lower extremity   . Abdominal pain 06/24/2019  . Nausea 06/24/2019  . Abnormal urine odor 06/24/2019  . History of sepsis 06/24/2019  . Recent major surgery 06/24/2019  . History of diverticulosis 06/24/2019  . Elevated PTHrP level 05/09/2019  . Paresthesia 02/12/2019  . Low back pain 02/12/2019  . Neuropathy 12/20/2018  . Lumbar spinal stenosis 06/29/2018  . Preoperative clearance 06/04/2018  . Abnormal CT of the chest 06/04/2018  . Smoker 05/17/2018  . Hypothyroidism 11/30/2017  . Thoracic aortic atherosclerosis ([place]) 09/14/2016  . Benign essential tremor 09/09/2016  . RLS (restless legs syndrome) 09/09/2016  . H/O adenomatous polyp of colon 07/06/2016  . PVC's (premature ventricular contractions) 03/29/2016  . Preoperative cardiovascular examination 03/29/2016  . Grade I diastolic dysfunction 78/29/5621  . Palpitations 01/11/2016  . Shortness of breath 01/11/2016  . Former cigarette smoker 01/11/2016  . Leg edema 01/11/2016  . Chronic cough 10/28/2015  . 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 Malignant neoplasm of upper-inner quadrant of left breast in female, estrogen receptor positive ([place]) 07/14/2015  . Prediabetes 07/08/2015  . CKD (chronic kidney disease), stage III  07/08/2015  . Dependent edema 06/08/2015  . Sleep disturbance 06/08/2015  . Mixed hyperlipidemia 06/08/2015  . Generalized anxiety disorder 06/08/2015  . Essential hypertension 06/08/2015  . COPD (chronic obstructive pulmonary disease) ([place]) 06/08/2015  . Gout 06/08/2015  . Macrocytic anemia   . 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 Sepsis ([place]) 05/15/2015  . Lumbar spondylosis 03/20/2015    Dorene Ar, PTA 03/31/2020, 11:58 AM  [facility] [address] [place], [place], [address] Phone: [phone]   Fax:  [phone]  Name: Brandi Stephens MRN: [id number] Date of Birth: 1945-12-24

## 2020-03-31 NOTE — Patient Instructions (Signed)
Access Code: [email or web address] URL: [email or web address] Date: 03/31/2020 Prepared by: Hessie Diener  Exercises Seated Abdominal Press into [employer] - 1 x daily - 7 x weekly - 3 sets - 10 reps Seated Eccentric Abdominal Lean Back - 1 x daily - 7 x weekly - 3 sets - 10 reps

## 2020-04-02 ENCOUNTER — Ambulatory Visit: Payer: PPO | Admitting: Physical Therapy

## 2020-04-02 ENCOUNTER — Other Ambulatory Visit: Payer: Self-pay

## 2020-04-02 ENCOUNTER — Encounter: Payer: Self-pay | Admitting: Physical Therapy

## 2020-04-02 DIAGNOSIS — M6281 Muscle weakness (generalized): Secondary | ICD-10-CM

## 2020-04-02 DIAGNOSIS — R2689 Other abnormalities of gait and mobility: Secondary | ICD-10-CM

## 2020-04-02 DIAGNOSIS — M545 Low back pain, unspecified: Secondary | ICD-10-CM | POA: Diagnosis not present

## 2020-04-02 DIAGNOSIS — G8929 Other chronic pain: Secondary | ICD-10-CM

## 2020-04-02 NOTE — Therapy (Signed)
[place], [place], [address] Phone: [phone]   Fax:  [phone]  Physical Therapy Treatment  Patient Details  Name: Brandi Stephens MRN: [id number] Date of Birth: 1945-05-02 Referring Provider (PT): Leeroy Cha, MD   Encounter Date: 04/02/2020   PT End of Session - 04/02/20 1430    Visit Number 21    Number of Visits 29    Date for PT Re-Evaluation 04/28/20    Authorization Type [employer] Medicare, next progress note by visit 28    PT Start Time 1425   arrived late   PT Stop Time 1500    PT Time Calculation (min) 35 min    Activity Tolerance Patient tolerated treatment well    Behavior During Therapy [facility] for tasks assessed/performed           Past Medical History:  Diagnosis Date  . AIN (anal intraepithelial neoplasia) anal canal   . Anemia    with pregnancy  . Anxiety   . Arthritis    knees, lower back, knees  . Chronic constipation   . Chronic diastolic CHF (congestive heart failure) ([place]) 07/05/2019  . CKD (chronic kidney disease)   . Coarse tremors    Essential  . COPD (chronic obstructive pulmonary disease) ([place])    2017 chest CT, history of  . Degenerative lumbar spinal stenosis   . Depression   . Depression 07/05/2019  . Drug dependency ([place])   . Elevated PTHrP level 05/09/2019  . ETOH abuse 07/05/2019  . GERD (gastroesophageal reflux disease)    pt denies  . Gout   . Grade I diastolic dysfunction 78/67/6720   Noted ECHO  . Hemorrhoids   . History of adenomatous polyp of colon   . History of basal cell carcinoma (BCC) excision 2015   nose  . History of sepsis 05/14/2014   post lumbar surgery  . History of shingles    x2  . Hyperlipidemia   . Hypertension   . Hypothyroidism   . Irregular heart beat    history of while undergoing radiation  . Malignant neoplasm of upper-inner quadrant of left breast in female, estrogen receptor positive [facility]) oncologist-  dr Jana Hakim--   per lov in epic no recurrence   dx 07-13-2015  Left breast invasive ductal carcinoma, Stage IA, Grade 1 (TXN0),  09-16-2015  s/p  left breast lumpectomy with sln bx's,  completed radiation 11-18-2015,  started antiestrogen therapy 12-14-2015  . Neuropathy    Left foot and back of left leg  . Obesity (BMI 30-39.9) 07/05/2019  . Personal history of radiation therapy    completed 11-18-2015  left breast  . Pneumonia   . PONV (postoperative nausea and vomiting)   . Prediabetes    diet controlled, no med  . Restless leg syndrome   . Seasonal allergies   . Seizure ([place]) 05/2015   due to sepsis, only one time episode  . Tremor   . Wears partial dentures    lower     Past Surgical History:  Procedure Laterality Date  . ABDOMINAL HYSTERECTOMY  1985  . BASAL CELL CARCINOMA EXCISION  10/15   nose  . BREAST BIOPSY Right 08/24/2015  . BREAST BIOPSY Right 07/13/2015  . BREAST BIOPSY Left 07/13/2015  . BREAST EXCISIONAL BIOPSY Left 1972  . BREAST LUMPECTOMY Left   . BREAST LUMPECTOMY WITH RADIOACTIVE SEED AND SENTINEL LYMPH NODE BIOPSY Left 09/16/2015   Procedure: BREAST LUMPECTOMY WITH RADIOACTIVE SEED AND SENTINEL LYMPH  NODE BIOPSY;  Surgeon: Stark Klein, MD;  Location: [facility];  Service: General;  Laterality: Left;  . BREAST REDUCTION SURGERY Bilateral 1998  . CATARACT EXTRACTION W/ INTRAOCULAR LENS IMPLANT Left 2016  . [place]   hallo  . COLONOSCOPY  01/2013   polyps  . EYE SURGERY Left 2016  . HAND SURGERY Left 02-19-2002   dr Fredna Dow  _0    repair collateral ligament/ MPJ left thumb  . HARDWARE REMOVAL N/A 07/24/2019   Procedure: REVISION OF HARDWARE Lumbar two - Sacral one. Extention of Fusion to Lumbar one;  Surgeon: [name], MD;  Location: [facility];  Service: Neurosurgery;  Laterality: N/A;  . HEMORRHOID SURGERY  2008  . HIGH RESOLUTION ANOSCOPY N/A 02/28/2018   Procedure: HIGH RESOLUTION ANOSCOPY WITH BIOPSY;  Surgeon: Leighton Ruff, MD;  Location: [facility];  Service: General;  Laterality: N/A;  . KNEE ARTHROSCOPY Right 2002  . LUMBAR SPINE SURGERY  03-20-2015   dr Kathyrn Sheriff   fusion L4-5, L5-S1  . MOUTH SURGERY  07/14/2018   2 infected teeth removed  . MULTIPLE TOOTH EXTRACTIONS     with bone grafting lower bottom right and left  . REDUCTION MAMMAPLASTY Bilateral   . RIGHT COLECTOMY  09-10-2003   dr Margot Chimes _1    multiple colon polyps (per path tubular adenoma's, hyperplastic , benign appendix, benign two lymph nodes  . ROTATOR CUFF REPAIR Left 04/2016  . TONSILLECTOMY  1969  . TUBAL LIGATION Bilateral 1982    Vitals:   04/02/20 1426  SpO2: 93%     Subjective Assessment - 04/02/20 1426    Subjective Back feeling a little better than earlier this week with pain 4-5/10 this PM.    Pertinent History L4-S1 fusion 03/20/15, L2-3 fusion 05/30/19, breast CA history 2017, seizures (due to sepsis),  lumbar hardware removal 07/24/19 with subsequent staph infection/osteomyelitis, CHF, COPD, chronic pain in pain management, history ETOH abuse, neuropathy, intention tremor    Limitations Sitting;House hold activities;Lifting;Standing;Walking    Currently in Pain? Yes    Pain Score --   4-5/10   Pain Location Back    Pain Orientation Lower    Pain Descriptors / Indicators Aching    Pain Type Chronic pain    Pain Onset More than a month ago    Pain Frequency Constant    Aggravating Factors  standing and walking    Pain Relieving Factors ice, medication, topical ointment    Effect of Pain on Daily Activities limits standing and walking tolerance                             [facility] Adult PT Treatment/Exercise - 04/02/20 0001      Lumbar Exercises: Aerobic   Nustep L5 x 5 min UE/LE      Lumbar Exercises: Standing   Heel Raises 15 reps    Functional Squats Limitations partial squat x15 reps at counter    Other Standing Lumbar Exercises pall off press x 15 ea. way with blue band       Lumbar Exercises: Seated   LAQ on Ball Limitations seated of stability disc- kicks then added opp UE raises   2x10   Other Seated Lumbar Exercises seated clamshell blue band 2x10    Other Seated Lumbar Exercises seated abdominal bracing with shoulder extension isometric on 65 cm P-ball on chair 2x10, seated alt. UE/LE raises x 20, seated posterior pelvic  tilt with ball squeeze 3-5 sec 2x10, seated bilateral heel slides x 15 reps for lower abdominals                    PT Short Term Goals - 01/31/20 1200      PT SHORT TERM GOAL #1   Title Independent with HEP    Time 3    Period Weeks    Status Achieved      PT SHORT TERM GOAL #2   Title Improve TUG time (with RW) at least 3-5 sec for decreased fall risk    Baseline 11 sec    Time 3    Period Weeks    Status Achieved      PT SHORT TERM GOAL #3   Title Ambulate at least 100 feet CGA with SPC for progression to LTG for gait    Baseline met/able    Time 3    Period Weeks    Status Achieved             PT Long Term Goals - 03/17/20 1431      PT LONG TERM GOAL #1   Title Improve [place] Gait + Balance at least 3-5 points for decreased fall risk    Baseline 20/28    Time 6    Period Weeks    Status On-going    Target Date 04/28/20      PT LONG TERM GOAL #2   Title Increase left hip and ankle strength at least grossly 1/2 MMT grade to improve ability to ascend/descend stairs to 2nd floor bedroom    Baseline see objective-partially met    Time 6    Period Weeks    Status Partially Met    Target Date 04/28/20      PT LONG TERM GOAL #3   Title Perform household level ambulation 150 feet or less mod I with SPC vs. LRAD    Baseline reports ambulating without AD 80% of the time at home, progressing    Time 6    Period Weeks    Status On-going    Target Date 04/28/20      PT LONG TERM GOAL #4   Title Improve 5 times sit<>stand time to 14 sec or less for improved ability for transfers from low seats     Baseline 10.8 sec    Time 6    Period Weeks    Status Achieved      PT LONG TERM GOAL #5   Title Tolerate standing for periods at least 15-20 min for cooking and light chores with LBP 4/10 or less    Baseline 4/10 pre-tx. but intermittently higher with activity    Time 6    Period Weeks    Status On-going    Target Date 04/28/20                 Plan - 04/02/20 1433    Clinical Impression Statement For strengthening continued focus standing and sitting exercises due to limited tolerance supine positioning due to LBP. Session abbreviated due to late arrival-fair progress with therapy goals with continued need RW for community mobility but is able to ambulate independently without AD at home and with improving ability for stair navigation. O2 sats intially measured at 87% when sitting down on NUSTEP at start of session but increased to 93% with cues for deep breathing and were maintained >/= 90% during remainder of session.    Personal Factors and Comorbidities Comorbidity  3+;Time since onset of injury/illness/exacerbation;Age    Comorbidities see PMH    Examination-Activity Limitations Sit;Dressing;Bathing;Transfers;Sleep;Hygiene/Grooming;Bed Mobility;Bend;Lift;Squat;Stairs;Locomotion Level;Carry;Stand;Continence;Toileting    Examination-Participation Restrictions Community Activity;Shop;Cleaning;Laundry;Meal Prep    Stability/Clinical Decision Making Evolving/Moderate complexity    Clinical Decision Making Moderate    Rehab Potential Good    PT Frequency 2x / week    PT Duration 6 weeks    PT Treatment/Interventions ADLs/Self Care Home Management;Cryotherapy;Moist Heat;Therapeutic activities;Gait training;Therapeutic exercise;Patient/family education;Manual techniques;Stair training;Functional mobility training;Balance training;Neuromuscular re-education;Taping    PT Next Visit Plan continue to monitor vitals as needed, pending pain and fatigue continue exercise progression with step  up variations, closed chain strengthening for LE, balance, core/back strengthening    PT Home Exercise Plan Access Code: [email or web address]: [email or web address]: 08/31/2021Prepared by: Caesar Bookman Squat with Counter Support - 2 x daily - 7 x weekly - 2 sets - 10 repsStanding March with Counter Support - 2 x daily - 7 x weekly - 3 sets - 10 repsStanding Heel Raise with Support - 2 x daily - 7 x weekly - 3 sets - 10 repsStanding Hip Abduction with Counter Support - 2 x daily - 7 x weekly - 3 sets - 10 reps, added [id number]: seated lean backs and seated physioball press for abdominals    Consulted and Agree with Plan of Care Patient           Patient will benefit from skilled therapeutic intervention in order to improve the following deficits and impairments:  Abnormal gait,Decreased balance,Decreased endurance,Difficulty walking,Impaired sensation,Pain,Impaired flexibility,Decreased strength,Decreased activity tolerance  Visit Diagnosis: Chronic bilateral low back pain, unspecified whether sciatica present  Muscle weakness (generalized)  Other abnormalities of gait and mobility     Problem List Patient Active Problem List   Diagnosis Date Noted  . Short-term memory loss 02/24/2020  . Peripheral edema 02/24/2020  . Osteopenia 01/22/2020  . Aortic atherosclerosis ([place]) 01/22/2020  . Wheezing 01/22/2020  . Diarrhea 01/22/2020  . Insomnia 10/29/2019  . Recurrent falls 09/06/2019  . Confusion, postoperative   . Benign essential HTN   . Post-operative pain   . Chronic constipation   . 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 Post op infection 07/11/2019  . Muscle spasm   . Hypokalemia   . Status post lumbar spinal fusion 07/10/2019  . Postoperative pain   . Acute blood loss anemia   . Labile blood pressure   . Tobacco abuse   . Hypoalbuminemia due to protein-calorie malnutrition ([place])   . Anxiety 07/05/2019  . Depression 07/05/2019  . Chronic diastolic  CHF (congestive heart failure) ([place]) 07/05/2019  . Obesity (BMI 30-39.9) 07/05/2019  . ETOH abuse 07/05/2019  . Lumbar radiculopathy 06/28/2019  . Spinal stenosis of lumbar region 06/28/2019  . Acute on chronic anemia   . Tremors of nervous system   . AKI (acute kidney injury) ([place])   . Chronic pain syndrome   . Acute renal failure superimposed on stage 3 chronic kidney disease ([place]) 06/25/2019  . Acute osteomyelitis of lumbar spine ([place]) 06/25/2019  . Weakness of left lower extremity   . Abdominal pain 06/24/2019  . Nausea 06/24/2019  . Abnormal urine odor 06/24/2019  . History of sepsis 06/24/2019  . Recent major surgery 06/24/2019  . History of diverticulosis 06/24/2019  . Elevated PTHrP level 05/09/2019  . Paresthesia 02/12/2019  . Low back pain 02/12/2019  . Neuropathy 12/20/2018  . Lumbar spinal stenosis 06/29/2018  . Preoperative clearance 06/04/2018  . Abnormal CT of the chest 06/04/2018  . Smoker 05/17/2018  .  Hypothyroidism 11/30/2017  . Thoracic aortic atherosclerosis ([place]) 09/14/2016  . Benign essential tremor 09/09/2016  . RLS (restless legs syndrome) 09/09/2016  . H/O adenomatous polyp of colon 07/06/2016  . PVC's (premature ventricular contractions) 03/29/2016  . Preoperative cardiovascular examination 03/29/2016  . Grade I diastolic dysfunction 76/19/5093  . Palpitations 01/11/2016  . Shortness of breath 01/11/2016  . Former cigarette smoker 01/11/2016  . Leg edema 01/11/2016  . Chronic cough 10/28/2015  . 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 Malignant neoplasm of upper-inner quadrant of left breast in female, estrogen receptor positive ([place]) 07/14/2015  . Prediabetes 07/08/2015  . CKD (chronic kidney disease), stage III 07/08/2015  . Dependent edema 06/08/2015  . Sleep disturbance 06/08/2015  . Mixed hyperlipidemia 06/08/2015  . Generalized anxiety disorder 06/08/2015  . Essential hypertension 06/08/2015  . COPD (chronic obstructive pulmonary  disease) ([place]) 06/08/2015  . Gout 06/08/2015  . Macrocytic anemia   . 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 Sepsis ([place]) 05/15/2015  . Lumbar spondylosis 03/20/2015    Beaulah Dinning, PT, DPT 04/02/20 3:00 PM  [facility] [address] [place], [place], [address] Phone: [phone]   Fax:  [phone]  Name: Brandi Stephens MRN: [id number] Date of Birth: [birth year]

## 2020-04-15 ENCOUNTER — Other Ambulatory Visit: Payer: Self-pay | Admitting: Internal Medicine

## 2020-04-15 DIAGNOSIS — M4626 Osteomyelitis of vertebra, lumbar region: Secondary | ICD-10-CM

## 2020-04-21 ENCOUNTER — Other Ambulatory Visit: Payer: Self-pay | Admitting: Family Medicine

## 2020-04-21 ENCOUNTER — Ambulatory Visit: Payer: PPO | Admitting: Physical Therapy

## 2020-04-22 ENCOUNTER — Other Ambulatory Visit: Payer: PPO

## 2020-04-24 ENCOUNTER — Ambulatory Visit: Payer: PPO | Admitting: Physical Therapy

## 2020-05-07 ENCOUNTER — Ambulatory Visit: Payer: PPO | Admitting: Internal Medicine

## 2020-05-08 ENCOUNTER — Other Ambulatory Visit: Payer: Self-pay | Admitting: Physical Medicine and Rehabilitation

## 2020-05-08 NOTE — Telephone Encounter (Signed)
Last note states was to return in Nov for [employer]. She states she was waiting on insurance to approve. She has an approval but not feeling well. I advise patient to call when she is feeling better to schedule. I will sent a refill in for Amitriptyline.

## 2020-05-11 ENCOUNTER — Other Ambulatory Visit: Payer: Self-pay

## 2020-05-11 ENCOUNTER — Telehealth (INDEPENDENT_AMBULATORY_CARE_PROVIDER_SITE_OTHER): Payer: PPO | Admitting: Family Medicine

## 2020-05-11 ENCOUNTER — Encounter: Payer: Self-pay | Admitting: Family Medicine

## 2020-05-11 ENCOUNTER — Ambulatory Visit (HOSPITAL_COMMUNITY)
Admission: EM | Admit: 2020-05-11 | Discharge: 2020-05-11 | Disposition: A | Discharge status: Home / Self Care | Payer: PPO | Attending: Emergency Medicine | Admitting: Emergency Medicine

## 2020-05-11 ENCOUNTER — Encounter (HOSPITAL_COMMUNITY): Payer: Self-pay

## 2020-05-11 VITALS — Temp 97.6°F

## 2020-05-11 DIAGNOSIS — R059 Cough, unspecified: Secondary | ICD-10-CM

## 2020-05-11 DIAGNOSIS — H5789 Other specified disorders of eye and adnexa: Secondary | ICD-10-CM

## 2020-05-11 DIAGNOSIS — R600 Localized edema: Secondary | ICD-10-CM | POA: Insufficient documentation

## 2020-05-11 DIAGNOSIS — I5032 Chronic diastolic (congestive) heart failure: Secondary | ICD-10-CM | POA: Diagnosis not present

## 2020-05-11 DIAGNOSIS — J3489 Other specified disorders of nose and nasal sinuses: Secondary | ICD-10-CM | POA: Diagnosis not present

## 2020-05-11 DIAGNOSIS — M79605 Pain in left leg: Secondary | ICD-10-CM | POA: Insufficient documentation

## 2020-05-11 DIAGNOSIS — Z20822 Contact with and (suspected) exposure to covid-19: Secondary | ICD-10-CM | POA: Insufficient documentation

## 2020-05-11 DIAGNOSIS — M7989 Other specified soft tissue disorders: Secondary | ICD-10-CM | POA: Insufficient documentation

## 2020-05-11 DIAGNOSIS — J029 Acute pharyngitis, unspecified: Secondary | ICD-10-CM

## 2020-05-11 DIAGNOSIS — R509 Fever, unspecified: Secondary | ICD-10-CM

## 2020-05-11 DIAGNOSIS — Z8619 Personal history of other infectious and parasitic diseases: Secondary | ICD-10-CM | POA: Diagnosis not present

## 2020-05-11 DIAGNOSIS — M79662 Pain in left lower leg: Secondary | ICD-10-CM | POA: Insufficient documentation

## 2020-05-11 DIAGNOSIS — R0981 Nasal congestion: Secondary | ICD-10-CM

## 2020-05-11 DIAGNOSIS — B349 Viral infection, unspecified: Secondary | ICD-10-CM | POA: Insufficient documentation

## 2020-05-11 DIAGNOSIS — R52 Pain, unspecified: Secondary | ICD-10-CM | POA: Diagnosis not present

## 2020-05-11 LAB — POC INFLUENZA A AND B ANTIGEN (URGENT CARE ONLY)
Influenza A Ag: NEGATIVE
Influenza B Ag: NEGATIVE

## 2020-05-11 NOTE — ED Triage Notes (Signed)
Patient states she has had a cough, congestion, sore throat, and nasal drainage as well as body aches x 6 days. Pt is aox4 and ambulatory.

## 2020-05-11 NOTE — Discharge Instructions (Addendum)
Your ultrasound is scheduled for tomorrow morning (05/12/2020) at 2:00 pm at [facility].    Your flu test is negative.    Your COVID test is pending.  You should self quarantine until the test result is back.    Take Tylenol or ibuprofen as needed for fever or discomfort.  Rest and keep yourself hydrated.    Follow-up with your primary care provider tomorrow.

## 2020-05-11 NOTE — ED Provider Notes (Signed)
[facility]    CSN: [id number] Arrival date & time: 05/11/20  1438      History   Chief Complaint Chief Complaint  Patient presents with  . Cough    X 6 days  . Sore Throat    X 6 days  . Nasal Congestion    X 6 days  . Generalized Body Aches    X 5 days    HPI Brandi Stephens is a 75 y.o. female.   Patient presents with 6-day history of sore throat, congestion, rhinorrhea, cough, body aches.  She denies fever, shortness of breath, emesis, unusual diarrhea.  She also reports 3-week history of left lower leg edema; she has had this problem intermittently for several months.  She has been taking Lasix for the past 10 days without relief of the edema.  She had a e-visit with her PCP today and was instructed to come here to rule out DVT and CHF.  Her complicated medical history includes leg edema, hypertension, diastolic dysfunction, chronic diastolic CHF, COPD, chronic cough, CKD, breast CA.  The history is provided by the patient and medical records.    Past Medical History:  Diagnosis Date  . AIN (anal intraepithelial neoplasia) anal canal   . Anemia    with pregnancy  . Anxiety   . Arthritis    knees, lower back, knees  . Chronic constipation   . Chronic diastolic CHF (congestive heart failure) ([place]) 07/05/2019  . CKD (chronic kidney disease)   . Coarse tremors    Essential  . COPD (chronic obstructive pulmonary disease) ([facility])    2017 chest CT, history of  . Degenerative lumbar spinal stenosis   . Depression   . Depression 07/05/2019  . Drug dependency ([place])   . Elevated PTHrP level 05/09/2019  . ETOH abuse 07/05/2019  . GERD (gastroesophageal reflux disease)    pt denies  . Gout   . Grade I diastolic dysfunction 40/98/1191   Noted ECHO  . Hemorrhoids   . History of adenomatous polyp of colon   . History of basal cell carcinoma (BCC) excision 2015   nose  . History of sepsis 05/14/2014   post lumbar surgery  . History of shingles    x2  .  Hyperlipidemia   . Hypertension   . Hypothyroidism   . Irregular heart beat    history of while undergoing radiation  . Malignant neoplasm of upper-inner quadrant of left breast in female, estrogen receptor positive [facility]) oncologist-  dr Jana Hakim--  per lov in epic no recurrence   dx 07-13-2015  Left breast invasive ductal carcinoma, Stage IA, Grade 1 (TXN0),  09-16-2015  s/p  left breast lumpectomy with sln bx's,  completed radiation 11-18-2015,  started antiestrogen therapy 12-14-2015  . Neuropathy    Left foot and back of left leg  . Obesity (BMI 30-39.9) 07/05/2019  . Personal history of radiation therapy    completed 11-18-2015  left breast  . Pneumonia   . PONV (postoperative nausea and vomiting)   . Prediabetes    diet controlled, no med  . Restless leg syndrome   . Seasonal allergies   . Seizure ([place]) 05/2015   due to sepsis, only one time episode  . Tremor   . Wears partial dentures    lower     Patient Active Problem List   Diagnosis Date Noted  . Short-term memory loss 02/24/2020  . Peripheral edema 02/24/2020  . Osteopenia 01/22/2020  . Aortic atherosclerosis (  [place]) 01/22/2020  . Wheezing 01/22/2020  . Diarrhea 01/22/2020  . Insomnia 10/29/2019  . Recurrent falls 09/06/2019  . Confusion, postoperative   . Benign essential HTN   . Post-operative pain   . Chronic constipation   . 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 Post op infection 07/11/2019  . Muscle spasm   . Hypokalemia   . Status post lumbar spinal fusion 07/10/2019  . Postoperative pain   . Acute blood loss anemia   . Labile blood pressure   . Tobacco abuse   . Hypoalbuminemia due to protein-calorie malnutrition ([place])   . Anxiety 07/05/2019  . Depression 07/05/2019  . Chronic diastolic CHF (congestive heart failure) ([place]) 07/05/2019  . Obesity (BMI 30-39.9) 07/05/2019  . ETOH abuse 07/05/2019  . Lumbar radiculopathy 06/28/2019  . Spinal stenosis of lumbar region 06/28/2019  . Acute on chronic  anemia   . Tremors of nervous system   . AKI (acute kidney injury) ([place])   . Chronic pain syndrome   . Acute renal failure superimposed on stage 3 chronic kidney disease ([place]) 06/25/2019  . Acute osteomyelitis of lumbar spine ([place]) 06/25/2019  . Weakness of left lower extremity   . Abdominal pain 06/24/2019  . Nausea 06/24/2019  . Abnormal urine odor 06/24/2019  . History of sepsis 06/24/2019  . Recent major surgery 06/24/2019  . History of diverticulosis 06/24/2019  . Elevated PTHrP level 05/09/2019  . Paresthesia 02/12/2019  . Low back pain 02/12/2019  . Neuropathy 12/20/2018  . Lumbar spinal stenosis 06/29/2018  . Preoperative clearance 06/04/2018  . Abnormal CT of the chest 06/04/2018  . Smoker 05/17/2018  . Hypothyroidism 11/30/2017  . Thoracic aortic atherosclerosis ([place]) 09/14/2016  . Benign essential tremor 09/09/2016  . RLS (restless legs syndrome) 09/09/2016  . H/O adenomatous polyp of colon 07/06/2016  . PVC's (premature ventricular contractions) 03/29/2016  . Preoperative cardiovascular examination 03/29/2016  . Grade I diastolic dysfunction 0000000  . Palpitations 01/11/2016  . Shortness of breath 01/11/2016  . Former cigarette smoker 01/11/2016  . Leg edema 01/11/2016  . Chronic cough 10/28/2015  . 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 Malignant neoplasm of upper-inner quadrant of left breast in female, estrogen receptor positive ([place]) 07/14/2015  . Prediabetes 07/08/2015  . CKD (chronic kidney disease), stage III 07/08/2015  . Dependent edema 06/08/2015  . Sleep disturbance 06/08/2015  . Mixed hyperlipidemia 06/08/2015  . Generalized anxiety disorder 06/08/2015  . Essential hypertension 06/08/2015  . COPD (chronic obstructive pulmonary disease) ([place]) 06/08/2015  . Gout 06/08/2015  . Macrocytic anemia   . 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 Sepsis ([place]) 05/15/2015  . Lumbar spondylosis 03/20/2015    Past Surgical History:  Procedure  Laterality Date  . ABDOMINAL HYSTERECTOMY  1985  . BASAL CELL CARCINOMA EXCISION  10/15   nose  . BREAST BIOPSY Right 08/24/2015  . BREAST BIOPSY Right 07/13/2015  . BREAST BIOPSY Left 07/13/2015  . BREAST EXCISIONAL BIOPSY Left 1972  . BREAST LUMPECTOMY Left   . BREAST LUMPECTOMY WITH RADIOACTIVE SEED AND SENTINEL LYMPH NODE BIOPSY Left 09/16/2015   Procedure: BREAST LUMPECTOMY WITH RADIOACTIVE SEED AND SENTINEL LYMPH NODE BIOPSY;  Surgeon: Stark Klein, MD;  Location: [facility];  Service: General;  Laterality: Left;  . BREAST REDUCTION SURGERY Bilateral 1998  . CATARACT EXTRACTION W/ INTRAOCULAR LENS IMPLANT Left 2016  . [place]   hallo  . COLONOSCOPY  01/2013   polyps  . EYE SURGERY Left 2016  . HAND  SURGERY Left 02-19-2002   dr Fredna Dow  @[facility]    repair collateral ligament/ MPJ left thumb  . HARDWARE REMOVAL N/A 07/24/2019   Procedure: REVISION OF HARDWARE Lumbar two - Sacral one. Extention of Fusion to Lumbar one;  Surgeon: [name], MD;  Location: [facility];  Service: Neurosurgery;  Laterality: N/A;  . HEMORRHOID SURGERY  2008  . HIGH RESOLUTION ANOSCOPY N/A 02/28/2018   Procedure: HIGH RESOLUTION ANOSCOPY WITH BIOPSY;  Surgeon: Leighton Ruff, MD;  Location[facility];  Service: General;  Laterality: N/A;  . KNEE ARTHROSCOPY Right 2002  . LUMBAR SPINE SURGERY  03-20-2015   dr Kathyrn Sheriff   fusion L4-5, L5-S1  . MOUTH SURGERY  07/14/2018   2 infected teeth removed  . MULTIPLE TOOTH EXTRACTIONS     with bone grafting lower bottom right and left  . REDUCTION MAMMAPLASTY Bilateral   . RIGHT COLECTOMY  09-10-2003   dr Margot Chimes @[facility]    multiple colon polyps (per path tubular adenoma's, hyperplastic , benign appendix, benign two lymph nodes  . ROTATOR CUFF REPAIR Left 04/2016  . TONSILLECTOMY  1969  . TUBAL LIGATION Bilateral 1982    OB History   No obstetric history on file.      Home Medications    Prior to Admission  medications   Medication Sig Start Date End Date Taking? Authorizing Provider  allopurinol (ZYLOPRIM) 300 MG tablet TAKE 1 TABLET(300 MG) BY MOUTH DAILY 08/09/19  Yes Angiulli, Lavon Paganini, PA-C  amitriptyline (ELAVIL) 75 MG tablet TAKE 1 TABLET(75 MG) BY MOUTH AT BEDTIME 05/08/20  Yes Raulkar, Clide Deutscher, MD  anastrozole (ARIMIDEX) 1 MG tablet TAKE 1 TABLET(1 MG) BY MOUTH DAILY 01/27/20  Yes Causey, Charlestine Massed, NP  atorvastatin (LIPITOR) 20 MG tablet TAKE 1 TABLET BY MOUTH EVERY DAY 04/21/20  Yes Henson, Vickie L, NP-C  betamethasone dipropionate 0.05 % cream Apply topically 2 (two) times daily. 12/25/19  Yes [provider]  cephALEXin (KEFLEX) 500 MG capsule TAKE 1 CAPSULE(500 MG) BY MOUTH THREE TIMES DAILY 04/15/20  Yes [name], MD  Cholecalciferol (D3 ADULT PO) Take by mouth.   Yes [provider]  diazepam (VALIUM) 5 MG tablet Take 1 tablet (5 mg total) by mouth daily as needed for anxiety. 03/19/20  Yes Raulkar, Clide Deutscher, MD  docusate sodium (COLACE) 100 MG capsule Take 1 capsule (100 mg total) by mouth 3 (three) times daily. 07/09/19  Yes Angiulli, Lavon Paganini, PA-C  DULoxetine (CYMBALTA) 60 MG capsule TAKE 1 CAPSULE(60 MG) BY MOUTH DAILY 01/22/20  Yes Henson, Vickie L, NP-C  furosemide (LASIX) 20 MG tablet TAKE 1 TABLET(20 MG) BY MOUTH DAILY AS NEEDED 12/26/19  Yes Raulkar, Clide Deutscher, MD  HYDROcodone-acetaminophen (NORCO/VICODIN) 5-325 MG tablet Take 1 tablet by mouth daily as needed for moderate pain. 03/19/20  Yes Raulkar, Clide Deutscher, MD  levothyroxine (SYNTHROID) 75 MCG tablet TAKE 1 TABLET(75 MCG) BY MOUTH DAILY BEFORE BREAKFAST 03/25/20  Yes Henson, Vickie L, NP-C  magnesium gluconate (MAGONATE) 500 MG tablet Take 2 tablets (1,000 mg total) by mouth at bedtime. 08/09/19  Yes Angiulli, Lavon Paganini, PA-C  metoprolol succinate (TOPROL-XL) 25 MG 24 hr tablet Take 1 tablet (25 mg total) by mouth daily. 10/02/19  Yes Raulkar, Clide Deutscher, MD  Omega-3 Fatty Acids (OMEGA-3 FISH OIL PO)  Take by mouth.   Yes [provider]  Probiotic Product (PROBIOTIC DAILY PO) Take by mouth.   Yes [provider]  QUEtiapine (SEROQUEL) 25 MG tablet Take 1 tablet (25  mg total) by mouth at bedtime. 01/22/20  Yes Henson, Vickie L, NP-C  rOPINIRole (REQUIP) 0.25 MG tablet Take 3 tablets (0.75 mg total) by mouth at bedtime. 09/06/19  Yes Henson, Vickie L, NP-C  vitamin B-12 (CYANOCOBALAMIN) 1000 MCG tablet Take 1 tablet (1,000 mcg total) by mouth daily. 08/09/19  Yes Angiulli, Lavon Paganini, PA-C  VITAMIN D, CHOLECALCIFEROL, PO Take by mouth.   Yes [provider]  albuterol (VENTOLIN HFA) 108 (90 Base) MCG/ACT inhaler INHALE 2 PUFFS BY MOUTH EVERY 4 TO 6 HOURS AS NEEDED 08/09/19   Angiulli, Lavon Paganini, PA-C  diclofenac Sodium (VOLTAREN) 1 % GEL Apply 4 g topically 4 (four) times daily. 09/27/19   [provider]  ondansetron (ZOFRAN) 4 MG tablet Take 1 tablet (4 mg total) by mouth daily as needed for nausea or vomiting. 08/10/19 08/09/20  Kirsteins, Luanna Salk, MD  polyethylene glycol powder (GLYCOLAX/MIRALAX) 17 GM/SCOOP powder Take 17 g by mouth daily as needed for moderate constipation.     [provider]  primidone (MYSOLINE) 50 MG tablet Take 1 tablet (50 mg total) by mouth 2 (two) times daily. TAKE 1 TABLET(50 MG) BY MOUTH AT BEDTIME 10/30/19   Raulkar, Clide Deutscher, MD    Family History Family History  Problem Relation Age of Onset  . Atrial fibrillation Mother   . Ulcers Father   . Breast cancer Maternal Aunt   . Lung cancer Maternal Uncle   . Stomach cancer Maternal Grandmother   . Lung cancer Maternal Aunt   . Brain cancer Maternal Aunt   . Prostate cancer Maternal Grandfather   . Stroke Paternal Grandmother   . Tuberculosis Paternal Grandfather   . Colon cancer Neg Hx   . Esophageal cancer Neg Hx   . Rectal cancer Neg Hx     Social History Social History   Tobacco Use  . Smoking status: Former Smoker    Packs/day: 0.50    Years: 35.00    Pack  years: 17.50    Types: Cigarettes    Quit date: 05/22/2018    Years since quitting: 1.9  . Smokeless tobacco: Never Used  Vaping Use  . Vaping Use: Never used  Substance Use Topics  . Alcohol use: Yes    Comment: 1 glass of wine a night  . Drug use: Not Currently    Comment: CBD tincture     Allergies   Ampicillin, Gabapentin, Penicillins, Codeine, and Other   Review of Systems Review of Systems  Constitutional: Negative for chills and fever.  HENT: Positive for congestion, rhinorrhea and sore throat. Negative for ear pain.   Eyes: Negative for pain and visual disturbance.  Respiratory: Positive for cough. Negative for shortness of breath.   Cardiovascular: Negative for chest pain and palpitations.  Gastrointestinal: Negative for abdominal pain and vomiting.  Genitourinary: Negative for dysuria and hematuria.  Musculoskeletal: Negative for arthralgias and back pain.  Skin: Negative for color change and rash.  Neurological: Negative for seizures and syncope.  All other systems reviewed and are negative.    Physical Exam Triage Vital Signs ED Triage Vitals  Enc Vitals Group     BP 05/11/20 1546 121/88     Pulse Rate 05/11/20 1543 (!) 116     Resp 05/11/20 1543 19     Temp 05/11/20 1546 98.9 F (37.2 C)     Temp Source 05/11/20 1543 Oral     SpO2 05/11/20 1543 95 %     Weight --  Height --      Head Circumference --      Peak Flow --      Pain Score --      Pain Loc --      Pain Edu? --      Excl. in [place]? --    No data found.  Updated Vital Signs BP 121/88 (BP Location: Right Arm)   Pulse (!) 116   Temp 98.9 F (37.2 C) (Oral)   Resp 19   SpO2 95%   Visual Acuity Right Eye Distance:   Left Eye Distance:   Bilateral Distance:    Right Eye Near:   Left Eye Near:    Bilateral Near:     Physical Exam Vitals and nursing note reviewed.  Constitutional:      General: She is not in acute distress.    Appearance: She is well-developed and  well-nourished.  HENT:     Head: Normocephalic and atraumatic.     Mouth/Throat:     Mouth: Mucous membranes are moist.  Eyes:     Conjunctiva/sclera: Conjunctivae normal.  Cardiovascular:     Rate and Rhythm: Normal rate and regular rhythm.     Heart sounds: Normal heart sounds.  Pulmonary:     Effort: Pulmonary effort is normal. No respiratory distress.     Breath sounds: Normal breath sounds. No wheezing, rhonchi or rales.  Abdominal:     Palpations: Abdomen is soft.     Tenderness: There is no abdominal tenderness.  Musculoskeletal:        General: Swelling present. No tenderness or edema. Normal range of motion.     Cervical back: Neck supple.     Comments: Left lower leg: 3+ edema, nontender, no erythema.  2+ bilateral pedal pulses.  No edema in RLE.   Skin:    General: Skin is warm and dry.     Capillary Refill: Capillary refill takes less than 2 seconds.     Findings: No bruising, erythema, lesion or rash.  Neurological:     General: No focal deficit present.     Mental Status: She is alert.     Sensory: No sensory deficit.     Gait: Gait abnormal.     Comments: Ambulatory with walker.   Psychiatric:        Mood and Affect: Mood and affect and mood normal.        Behavior: Behavior normal.      UC Treatments / Results  Labs (all labs ordered are listed, but only abnormal results are displayed) Labs Reviewed  SARS CORONAVIRUS 2 (TAT 6-24 HRS)    EKG   Radiology No results found.  Procedures Procedures (including critical care time)  Medications Ordered in UC Medications - No data to display  Initial Impression / Assessment and Plan / UC Course  I have reviewed the triage vital signs and the nursing notes.  Pertinent labs & imaging results that were available during my care of the patient were reviewed by me and considered in my medical decision making (see chart for details).   Left lower extremity edema.  Viral illness.  Ultrasound of left lower  extremity scheduled for tomorrow at 2 PM; patient was unable to go for ultrasound today.  She also declines chest x-ray today.  POC influenza negative.  PCR COVID pending.  Discussed symptomatic treatment with Tylenol or ibuprofen, rest, hydration.  Instructed patient to follow-up with her PCP tomorrow.   Final Clinical Impressions(s) / UC  Diagnoses   Final diagnoses:  Edema of left lower leg  Viral illness     Discharge Instructions     Your ultrasound is scheduled for tomorrow morning (05/12/2020) at 2:00 pm at [facility].    Your flu test is negative.    Your COVID test is pending.  You should self quarantine until the test result is back.    Take Tylenol or ibuprofen as needed for fever or discomfort.  Rest and keep yourself hydrated.    Follow-up with your primary care provider tomorrow.           ED Prescriptions    None     PDMP not reviewed this encounter.   Sharion Balloon, NP 05/11/20 2022

## 2020-05-11 NOTE — Progress Notes (Signed)
   Subjective:  Documentation for virtual audio and video telecommunications through [place] encounter:  The patient was located at home. 2 patient identifiers used.  The provider was located in the office. The patient did consent to this visit and is aware of possible charges through their insurance for this visit.  The other persons participating in this telemedicine service were none. Time spent on call was 15 minutes and in review of previous records 20 minutes total.  This virtual service is not related to other E/M service within previous 7 days.   Patient ID: Brandi Stephens, female    DOB: 1945/12/31, 75 y.o.   MRN: [id number]  HPI Chief Complaint  Patient presents with  . possible flu    Possible flu x 6 days, sore throat, cough, red eyes, ow grade fever, chills, runny nose, took home test on day 2 and it was negative.  Has swollen foot. Been on lasix for 10 days and still not going down.    Complains of fever, chills, body aches, rhinorrhea, nasal congestion, sore throat, and mild cough for the past 6 days.  States she has been taking Tylenol and nothing else. Reports having a negative home Covid test on day 2 of symptoms.  She has been getting worse.   States her left foot is severely swollen and is turning "white" on top. States it is painful to walk on and she is using her walker but feels that she may fall.  Complains of feeling "swollen everywhere".  She has underlying CHF. Has been taking Lasix for the past 10 days without any improvement.   Denies chest pain, shortness of breath, abdominal pain, N/V/D.   Reviewed allergies, medications, past medical, surgical, family, and social history.    Review of Systems Pertinent positives and negatives in the history of present illness.     Objective:   Physical Exam Temp 97.6 F (36.4 C)   She is alert and in no acute distress but is ill appearing. Respirations unlabored and speaking in complete sentences without  difficulty.       Assessment & Plan:  Edema of left lower leg  Edema of left foot  Fever and chills  Nasal congestion with rhinorrhea  Cough  Redness of both eyes  Chronic diastolic CHF (congestive heart failure) (H[facility])  History of sepsis  Discussed that her negative home Covid test may have been a false negative and I recommend a repeat Covid antigen test as well as a PCR test. We had decided that she would come to my office parking lot for testing until she reported left lower leg and foot with severe edema. I am concerned that she may have a DVT or CHF exacerbation. Lasix is not helping with her edema and she has been taking it for 10 days. No potassium supplement and she has a hx of hyperkalemia.  She will go to [facility] for further evaluation and she is aware that they may send her to the ED pending findings on her exam.

## 2020-05-11 NOTE — ED Triage Notes (Signed)
Patient also complains of left foot swelling related to fluid retention due to a spinal infection. Pt states she has pain and pins and needles feelings in her foot as well.

## 2020-05-12 ENCOUNTER — Ambulatory Visit (HOSPITAL_BASED_OUTPATIENT_CLINIC_OR_DEPARTMENT_OTHER)
Admit: 2020-05-12 | Discharge: 2020-05-12 | Disposition: A | Discharge status: Home / Self Care | Payer: PPO | Attending: Emergency Medicine | Admitting: Emergency Medicine

## 2020-05-12 ENCOUNTER — Telehealth: Payer: Self-pay | Admitting: Internal Medicine

## 2020-05-12 DIAGNOSIS — M7989 Other specified soft tissue disorders: Secondary | ICD-10-CM | POA: Diagnosis not present

## 2020-05-12 DIAGNOSIS — M79605 Pain in left leg: Secondary | ICD-10-CM | POA: Diagnosis not present

## 2020-05-12 LAB — SARS CORONAVIRUS 2 (TAT 6-24 HRS): SARS Coronavirus 2: NEGATIVE

## 2020-05-12 NOTE — Telephone Encounter (Signed)
Ultrasound negative for DVT.  Attempted to call patient to discuss the test result.  No answer; left message to call back.

## 2020-05-12 NOTE — Telephone Encounter (Signed)
Please check on her and see if she is doing warm salt water gargles and using Chloraseptic regularly. Is her PCR test result back yet?

## 2020-05-12 NOTE — Telephone Encounter (Signed)
Pt left vm on my phone that her flu and covid was negative but she is still really sick. Her throat is really sore and she is hoarse. She wants to know what else can be done. She is miserable

## 2020-05-12 NOTE — CV Procedure (Signed)
LLE venous duplex complete/ Barkley Boards, NP messaged with preliminary results  Results can be found under chart review under CV PROC. 05/12/2020 3:17 PM Lilyona Richner RVT, RDMS

## 2020-05-13 NOTE — Telephone Encounter (Signed)
Alittle better today than yesterday but still sore throat and cough, weak and fatigue. Pcr was negative she is using salt water gargles and chloraseptic spray

## 2020-05-14 ENCOUNTER — Telehealth: Payer: Self-pay | Admitting: Internal Medicine

## 2020-05-14 ENCOUNTER — Ambulatory Visit: Payer: PPO | Admitting: Internal Medicine

## 2020-05-14 NOTE — Telephone Encounter (Signed)
If the Lasix is not helping reduce her swelling then she can stop it. She really needs to be evaluated and have labs done. I don't want her to get into trouble with her breathing or potassium level.

## 2020-05-14 NOTE — Telephone Encounter (Signed)
Called and put her name on the answer machine to call to schedule.to hopefully be seen tomorrow  What does she need to do about her lasix

## 2020-05-14 NOTE — Telephone Encounter (Signed)
Pt was notified of results

## 2020-05-14 NOTE — Telephone Encounter (Signed)
Pt called and states that she is still having low grade fever, chills, cough, sore throat and no voice. She had the same symptoms her daughter has and she was positive. Her covid test and flu test was negative but she wants to know what is going on with her.   Also she is still swollen and wants to know how much longer does she need to be on lasix.

## 2020-05-14 NOTE — Telephone Encounter (Signed)
Can she be seen at the respiratory clinic tonight? I am concerned that she had false negative Covid results.

## 2020-05-18 ENCOUNTER — Ambulatory Visit (INDEPENDENT_AMBULATORY_CARE_PROVIDER_SITE_OTHER): Payer: PPO | Admitting: Medical

## 2020-05-18 VITALS — BP 122/73 | HR 98 | Temp 96.3°F | Resp 16 | Wt 262.0 lb

## 2020-05-18 DIAGNOSIS — J431 Panlobular emphysema: Secondary | ICD-10-CM | POA: Diagnosis not present

## 2020-05-18 DIAGNOSIS — I5189 Other ill-defined heart diseases: Secondary | ICD-10-CM

## 2020-05-18 DIAGNOSIS — R110 Nausea: Secondary | ICD-10-CM | POA: Diagnosis not present

## 2020-05-18 DIAGNOSIS — E038 Other specified hypothyroidism: Secondary | ICD-10-CM | POA: Diagnosis not present

## 2020-05-18 DIAGNOSIS — R059 Cough, unspecified: Secondary | ICD-10-CM

## 2020-05-18 DIAGNOSIS — R519 Headache, unspecified: Secondary | ICD-10-CM | POA: Diagnosis not present

## 2020-05-18 DIAGNOSIS — I10 Essential (primary) hypertension: Secondary | ICD-10-CM

## 2020-05-18 DIAGNOSIS — R609 Edema, unspecified: Secondary | ICD-10-CM

## 2020-05-18 DIAGNOSIS — N1832 Chronic kidney disease, stage 3b: Secondary | ICD-10-CM | POA: Diagnosis not present

## 2020-05-18 NOTE — Progress Notes (Signed)
[facility]   Subjective:  Brandi Stephens is a 75 y.o. female who presents for respiratory illness.    PCP: Girtha Rm, NP-C  Visit today for recheck on cough and symptoms.  She started out with symptoms at the beginning of the year in early January.  Symptoms somewhat improved but then came back again 3 weeks ago.  Her current symptom aggravation has been going on about 3 weeks now.  She notes cough, congestion, runny nose, cough worse lying down, loss of taste, some sore throat, some nausea, some body aches and chills, more drainage now, some headache, low-grade fever.  She denies orthopnea, no chest pain no palpitations.  She has an adjustable bed and sleeps on her back.  She did a virtual visit with her PCP within the past 2 weeks and at that time had a lot of lower extremity edema.  She has been taking Lasix daily for the last 15 days which has improved quite a bit her swelling.  She is currently taking sinus and cold medication, Tylenol.  She has had 2 recent Covid test are both negative.  Her daughter and granddaughter both tested positive last week for [facility] has had the vaccine but not the booster.  She has COPD.  She quit smoking 4 years ago.  She has seen infectious disease in the past due to prior multiple pneumonias.  She is on long-term Keflex every single day for prophylaxis per infectious disease  Past history is significant for several health problems and is on several medications.    Patient is a former smoker. No other aggravating or relieving factors.  No other c/o.  Past Medical History:  Diagnosis Date  . AIN (anal intraepithelial neoplasia) anal canal   . Anemia    with pregnancy  . Anxiety   . Arthritis    knees, lower back, knees  . Chronic constipation   . Chronic diastolic CHF (congestive heart failure) ([place]) 07/05/2019  . CKD (chronic kidney disease)   . Coarse tremors    Essential  . COPD (chronic obstructive pulmonary  disease) ([place])    2017 chest CT, history of  . Degenerative lumbar spinal stenosis   . Depression   . Depression 07/05/2019  . Drug dependency ([place])   . Elevated PTHrP level 05/09/2019  . ETOH abuse 07/05/2019  . GERD (gastroesophageal reflux disease)    pt denies  . Gout   . Grade I diastolic dysfunction 0000000   Noted ECHO  . Hemorrhoids   . History of adenomatous polyp of colon   . History of basal cell carcinoma (BCC) excision 2015   nose  . History of sepsis 05/14/2014   post lumbar surgery  . History of shingles    x2  . Hyperlipidemia   . Hypertension   . Hypothyroidism   . Irregular heart beat    history of while undergoing radiation  . Malignant neoplasm of upper-inner quadrant of left breast in female, estrogen receptor positive [facility]) oncologist-  dr Jana Hakim--  per lov in epic no recurrence   dx 07-13-2015  Left breast invasive ductal carcinoma, Stage IA, Grade 1 (TXN0),  09-16-2015  s/p  left breast lumpectomy with sln bx's,  completed radiation 11-18-2015,  started antiestrogen therapy 12-14-2015  . Neuropathy    Left foot and back of left leg  . Obesity (BMI 30-39.9) 07/05/2019  . Personal history of radiation therapy    completed 11-18-2015  left breast  .  Pneumonia   . PONV (postoperative nausea and vomiting)   . Prediabetes    diet controlled, no med  . Restless leg syndrome   . Seasonal allergies   . Seizure ([place]) 05/2015   due to sepsis, only one time episode  . Tremor   . Wears partial dentures    lower     ROS as in subjective   Objective: BP 122/73   Pulse 98   Temp (!) 96.3 F (35.7 C)   Resp 16   Wt 262 lb (118.8 kg)   SpO2 92%   BMI 40.43 kg/m   Wt Readings from Last 3 Encounters:  05/18/20 262 lb (118.8 kg)  02/24/20 259 lb (117.5 kg)  02/20/20 260 lb (117.9 kg)   BP Readings from Last 3 Encounters:  05/18/20 122/73  05/11/20 121/88  02/20/20 (!) 162/94    General appearance: Alert, WD/WN, no distress, mildly ill  appearing, white female                             Skin: warm, no rash                           Head: no sinus tenderness                            Eyes: conjunctiva normal, corneas clear, PERRLA                            Ears: pearly TMs, external ear canals normal                          Nose: septum midline, turbinates without erythema and no discharge             Mouth/throat: somewhat dry MM, tongue normal, no pharyngeal erythema                           Neck: supple, no adenopathy, no thyromegaly, non tender                          Heart: RRR, normal S1, S2, no murmurs                         Lungs: decreased breath sounds bilaterally, no wheezes, rales, or rhonchi                         Ext: no calve asymetry, mild 1+ nonpitting bilat LE edema, negative homans       Assessment  Encounter Diagnoses  Name Primary?  . Cough Yes  . Edema, unspecified type   . Nonintractable headache, unspecified chronicity pattern, unspecified headache type   . Nausea without vomiting   . Panlobular emphysema ([place])   . Essential hypertension   . Other specified hypothyroidism   . Stage 3b chronic kidney disease ([place])   . Grade I diastolic dysfunction       Plan: She has every 3-week history of cough, variety of illness symptoms suggestive of respiratory tract infection/bronchitis.  She was also seen recently for significant lower extremity edema which has improved on Lasix.  I reviewed her recent emergency department  consult note as well as her virtual visit with her PCP.  They wanted her seen here today to also consider DVT/PE or CHF.  Her current symptoms suggest more of a bronchitis/respiratory tract infection.  She has underlying health issues and her edema has improved on Lasix from her evaluation in the emergency department  Clinically today she does not look quite toxic but is not back to her baseline yet  Lab draw tonight and we will review these tomorrow.  She will go for chest  x-ray tomorrow at [facility]  In the meantime advise she continue with good hydration, rest, can continue her over-the-counter Tylenol Cold and sinus medicine.  Advise she continue her albuterol, continue Zofran as needed which she already has.   General recommendations if you have respiratory symptoms: We recommend you rest, hydrate well with water and clear fluids throughout the day such as water, soup broth, ice chips, or possibly pedialyte or G2 no sugar gatorade.   You can use Tylenol over the counter for pain or fever every 4 - 6 hours You can use over the counter Delsym or mucinex DM for cough unless your provider prescribed a cough medication already. Consider EmergenC Immune plus vitamin pack over the counter which contains extra vitamin C, vitamin D, and zinc.  If you are having trouble breathing, if you are very weak, have high fever 103 or higher consistently despite Tylenol, or uncontrollable nausea and vomiting, then call or go to the emergency department.    Pending labs and x-ray consider a round of antibiotic different than her daily prophylactic Keflex    Jakiah was seen today for generalized body aches and shortness of breath.  Diagnoses and all orders for this visit:  Cough -     Basic metabolic panel -     CBC with Differential/Platelet -     D-dimer, quantitative (not at [facility]) -     Brain natriuretic peptide -     DG Chest 2 View; Future  Edema, unspecified type -     Basic metabolic panel -     CBC with Differential/Platelet -     D-dimer, quantitative (not at [facility]) -     Brain natriuretic peptide -     DG Chest 2 View; Future  Nonintractable headache, unspecified chronicity pattern, unspecified headache type -     Basic metabolic panel -     CBC with Differential/Platelet -     D-dimer, quantitative (not at [facility]) -     Brain natriuretic peptide -     DG Chest 2 View; Future  Nausea without vomiting -     Basic metabolic panel -     CBC  with Differential/Platelet -     D-dimer, quantitative (not at [facility]) -     Brain natriuretic peptide -     DG Chest 2 View; Future  Panlobular emphysema (HCC) -     Basic metabolic panel -     CBC with Differential/Platelet -     D-dimer, quantitative (not at [facility]) -     Brain natriuretic peptide -     DG Chest 2 View; Future  Essential hypertension -     Basic metabolic panel -     CBC with Differential/Platelet -     D-dimer, quantitative (not at [facility]) -     Brain natriuretic peptide -     DG Chest 2 View; Future  Other specified hypothyroidism -     Basic metabolic  panel -     CBC with Differential/Platelet -     D-dimer, quantitative (not at [facility]) -     Brain natriuretic peptide -     DG Chest 2 View; Future  Stage 3b chronic kidney disease ([place]) -     Basic metabolic panel -     CBC with Differential/Platelet -     D-dimer, quantitative (not at [facility]) -     Brain natriuretic peptide -     DG Chest 2 View; Future  Grade I diastolic dysfunction -     Basic metabolic panel -     CBC with Differential/Platelet -     D-dimer, quantitative (not at [facility]) -     Brain natriuretic peptide -     DG Chest 2 View; Future     Patient voiced understanding of diagnosis, recommendations, and treatment plan.  After visit summary given.   Follow up pending chest xray, labs

## 2020-05-20 ENCOUNTER — Ambulatory Visit (HOSPITAL_COMMUNITY)
Admission: RE | Admit: 2020-05-20 | Discharge: 2020-05-20 | Disposition: A | Discharge status: Home / Self Care | Payer: PPO | Source: Ambulatory Visit | Attending: Medical | Admitting: Medical

## 2020-05-20 ENCOUNTER — Other Ambulatory Visit: Payer: Self-pay | Admitting: Medical

## 2020-05-20 ENCOUNTER — Telehealth: Payer: Self-pay | Admitting: Medical

## 2020-05-20 DIAGNOSIS — J431 Panlobular emphysema: Secondary | ICD-10-CM | POA: Diagnosis not present

## 2020-05-20 DIAGNOSIS — I5189 Other ill-defined heart diseases: Secondary | ICD-10-CM

## 2020-05-20 DIAGNOSIS — R519 Headache, unspecified: Secondary | ICD-10-CM | POA: Diagnosis not present

## 2020-05-20 DIAGNOSIS — R110 Nausea: Secondary | ICD-10-CM | POA: Insufficient documentation

## 2020-05-20 DIAGNOSIS — R0602 Shortness of breath: Secondary | ICD-10-CM

## 2020-05-20 DIAGNOSIS — I517 Cardiomegaly: Secondary | ICD-10-CM | POA: Diagnosis not present

## 2020-05-20 DIAGNOSIS — N1832 Chronic kidney disease, stage 3b: Secondary | ICD-10-CM | POA: Insufficient documentation

## 2020-05-20 DIAGNOSIS — E038 Other specified hypothyroidism: Secondary | ICD-10-CM

## 2020-05-20 DIAGNOSIS — R059 Cough, unspecified: Secondary | ICD-10-CM | POA: Insufficient documentation

## 2020-05-20 DIAGNOSIS — I10 Essential (primary) hypertension: Secondary | ICD-10-CM | POA: Diagnosis not present

## 2020-05-20 DIAGNOSIS — R609 Edema, unspecified: Secondary | ICD-10-CM | POA: Diagnosis not present

## 2020-05-20 DIAGNOSIS — N6320 Unspecified lump in the left breast, unspecified quadrant: Secondary | ICD-10-CM | POA: Diagnosis not present

## 2020-05-20 LAB — CBC WITH DIFFERENTIAL/PLATELET
Basophils Absolute: 0.1 10*3/uL (ref 0.0–0.2)
Basos: 1 %
EOS (ABSOLUTE): 0.4 10*3/uL (ref 0.0–0.4)
Eos: 4 %
Hematocrit: 38.9 % (ref 34.0–46.6)
Hemoglobin: 13.2 g/dL (ref 11.1–15.9)
Immature Grans (Abs): 0.2 10*3/uL — ABNORMAL HIGH (ref 0.0–0.1)
Immature Granulocytes: 2 %
Lymphocytes Absolute: 1.4 10*3/uL (ref 0.7–3.1)
Lymphs: 17 %
MCH: 32.2 pg (ref 26.6–33.0)
MCHC: 33.9 g/dL (ref 31.5–35.7)
MCV: 95 fL (ref 79–97)
Monocytes Absolute: 0.6 10*3/uL (ref 0.1–0.9)
Monocytes: 8 %
Neutrophils Absolute: 5.9 10*3/uL (ref 1.4–7.0)
Neutrophils: 68 %
Platelets: 241 10*3/uL (ref 150–450)
RBC: 4.1 x10E6/uL (ref 3.77–5.28)
RDW: 13 % (ref 11.7–15.4)
WBC: 8.6 10*3/uL (ref 3.4–10.8)

## 2020-05-20 LAB — BASIC METABOLIC PANEL
BUN/Creatinine Ratio: 30 — ABNORMAL HIGH (ref 12–28)
BUN: 33 mg/dL — ABNORMAL HIGH (ref 8–27)
CO2: 23 mmol/L (ref 20–29)
Calcium: 10.1 mg/dL (ref 8.7–10.3)
Chloride: 101 mmol/L (ref 96–106)
Creatinine, Ser: 1.09 mg/dL — ABNORMAL HIGH (ref 0.57–1.00)
GFR calc Af Amer: 58 mL/min/{1.73_m2} — ABNORMAL LOW (ref >59)
GFR calc non Af Amer: 50 mL/min/{1.73_m2} — ABNORMAL LOW (ref >59)
Glucose: 137 mg/dL — ABNORMAL HIGH (ref 65–99)
Potassium: 5.1 mmol/L (ref 3.5–5.2)
Sodium: 139 mmol/L (ref 134–144)

## 2020-05-20 LAB — D-DIMER, QUANTITATIVE: D-DIMER: 0.84 mg/L FEU — ABNORMAL HIGH (ref 0.00–0.49)

## 2020-05-20 LAB — BRAIN NATRIURETIC PEPTIDE: BNP: 30.4 pg/mL (ref 0.0–100.0)

## 2020-05-20 NOTE — Telephone Encounter (Signed)
I haven't seen lab results yet on this lady from Monday.   Can you check on why

## 2020-05-21 ENCOUNTER — Other Ambulatory Visit: Payer: PPO

## 2020-05-21 ENCOUNTER — Other Ambulatory Visit: Payer: Self-pay | Admitting: Internal Medicine

## 2020-05-21 ENCOUNTER — Telehealth: Payer: Self-pay | Admitting: Internal Medicine

## 2020-05-21 ENCOUNTER — Other Ambulatory Visit: Payer: Self-pay | Admitting: Medical

## 2020-05-21 ENCOUNTER — Inpatient Hospital Stay: Admission: RE | Admit: 2020-05-21 | Payer: PPO | Source: Ambulatory Visit

## 2020-05-21 DIAGNOSIS — R0602 Shortness of breath: Secondary | ICD-10-CM

## 2020-05-21 DIAGNOSIS — R059 Cough, unspecified: Secondary | ICD-10-CM

## 2020-05-21 DIAGNOSIS — R079 Chest pain, unspecified: Secondary | ICD-10-CM

## 2020-05-21 NOTE — Telephone Encounter (Signed)
Lets get this right for next time.  Based on [facility]'s response today, what is the proper test, what is the proper patient instructions?    I don't want to mess this up again.   I feel like imaging needs to help [place] make the right orders since Epic confuses things with half a dozen options for CT imaging.    I am going to do a safety portal on this one.   Also, call patient ASAP Friday morning to make sure this thing gets done.  I called her Thursday night 05/21/20 and left message for her to preferably go to the emergency dept this evening for evaluation and scan since this has been delayed so long (since Tuesday).  I couldn't get her on the phone Thursday night.  See how she is feeling today/Friday

## 2020-05-21 NOTE — Telephone Encounter (Signed)
Pt went to have CT chest done and noone told me to tell pt not to eat or drink 4 hours prior so when pt got there, she had eaten within 2 hours so they wouldn't do the test. She goes tomorrow morning at 8am

## 2020-05-22 ENCOUNTER — Other Ambulatory Visit: Payer: Self-pay | Admitting: Medical

## 2020-05-22 ENCOUNTER — Other Ambulatory Visit: Payer: Self-pay

## 2020-05-22 ENCOUNTER — Ambulatory Visit
Admission: RE | Admit: 2020-05-22 | Discharge: 2020-05-22 | Disposition: A | Discharge status: Home / Self Care | Payer: PPO | Source: Ambulatory Visit | Attending: Medical | Admitting: Medical

## 2020-05-22 DIAGNOSIS — E041 Nontoxic single thyroid nodule: Secondary | ICD-10-CM | POA: Diagnosis not present

## 2020-05-22 DIAGNOSIS — R0602 Shortness of breath: Secondary | ICD-10-CM

## 2020-05-22 DIAGNOSIS — R059 Cough, unspecified: Secondary | ICD-10-CM

## 2020-05-22 DIAGNOSIS — R918 Other nonspecific abnormal finding of lung field: Secondary | ICD-10-CM | POA: Diagnosis not present

## 2020-05-22 DIAGNOSIS — R079 Chest pain, unspecified: Secondary | ICD-10-CM

## 2020-05-22 DIAGNOSIS — I700 Atherosclerosis of aorta: Secondary | ICD-10-CM | POA: Diagnosis not present

## 2020-05-22 DIAGNOSIS — J432 Centrilobular emphysema: Secondary | ICD-10-CM | POA: Diagnosis not present

## 2020-05-22 MED ORDER — FLUTICASONE-SALMETEROL 250-50 MCG/DOSE IN AEPB: 1.0000 | INHALATION_SPRAY | Freq: Two times a day (BID) | RESPIRATORY_TRACT | 0 refills | Status: DC

## 2020-05-22 MED ORDER — PREDNISONE 20 MG PO TABS: 40.0000 mg | ORAL_TABLET | Freq: Every day | ORAL | 0 refills | Status: DC

## 2020-05-22 MED ORDER — FLUTICASONE FUROATE-VILANTEROL 200-25 MCG/INH IN AEPB: 1.0000 | INHALATION_SPRAY | Freq: Every day | RESPIRATORY_TRACT | 1 refills | Status: DC

## 2020-05-22 MED ORDER — AZITHROMYCIN 250 MG PO TABS: ORAL_TABLET | ORAL | 0 refills | Status: AC

## 2020-05-22 MED ORDER — IOPAMIDOL (ISOVUE-370) INJECTION 76%
75.0000 mL | Freq: Once | INTRAVENOUS | Status: AC | PRN
  Administered 2020-05-22: 75 mL via INTRAVENOUS

## 2020-05-22 NOTE — Telephone Encounter (Signed)
Pt is feeling just fatigue and some sob with just walking for the imaging

## 2020-05-22 NOTE — Telephone Encounter (Signed)
Got pt scheduled with Vickie but she wanted me to let you know that the [facility] inhaler is too expensive and wanted to see if she can try something else instead

## 2020-05-22 NOTE — Telephone Encounter (Signed)
I sent Advair as alternate

## 2020-05-22 NOTE — Telephone Encounter (Signed)
She note-please get her on the schedule next week in person with Jonathon Resides I called her and spoke to her about her CT results.  Thankfully no pulmonary embolism.  Even though she had negative Covid test with this recent illness it is not unclear that she still may have had COVID or at least some type of pneumonia.  She takes Keflex daily for prophylaxis.  I will add on Z-Pak for the weekend, a short round of prednisone and Breo daily preventative inhaler.  We discussed proper use of those medications.  The recent work-up did not show heart failure or pulmonary embolism thankfully.  I asked her to follow-up next week with her primary care provider here [place].  Things to consider on follow-up 1-thyroid nodule noted on CT scan 2-recheck thyroid function labs 3-consider if any need to see cardiology for evaluation, atherosclerosis noted on scan 4-although she was not necessarily complaining of shortness of breath at rest she specifically noted being out of breath coming and going from the imaging facility.  So I wonder if her COPD is worse than she realizes.  She likely needs to stay on a preventative inhaler 5-recheck on some general labs including blood sugars 6-recheck weights given the recent edema and improvement with Lasix  Overall she felt improved from earlier in the week thankfully.  But she notes in general even before last week she is limited in her activity and has had numerous pneumonias in the last few years  Advised that if the [facility] inhaler was too expensive to call back within an hour so we can send something else to the pharmacy.  She has used Breo in the past with improvement

## 2020-05-22 NOTE — Telephone Encounter (Signed)
Patient informed via my chart.

## 2020-05-26 NOTE — Therapy (Signed)
[place], [place], [address] Phone: [phone]   Fax:  [phone]  Physical Therapy Treatment/Discharge  Patient Details  Name: Brandi Stephens MRN: [id number] Date of Birth: [birth year] Referring Provider (PT): Leeroy Cha, MD   Encounter Date: 04/02/2020    Past Medical History:  Diagnosis Date  . AIN (anal intraepithelial neoplasia) anal canal   . Anemia    with pregnancy  . Anxiety   . Arthritis    knees, lower back, knees  . Chronic constipation   . Chronic diastolic CHF (congestive heart failure) ([place]) 07/05/2019  . CKD (chronic kidney disease)   . Coarse tremors    Essential  . COPD (chronic obstructive pulmonary disease) ([place])    2017 chest CT, history of  . Degenerative lumbar spinal stenosis   . Depression   . Depression 07/05/2019  . Drug dependency ([place])   . Elevated PTHrP level 05/09/2019  . ETOH abuse 07/05/2019  . GERD (gastroesophageal reflux disease)    pt denies  . Gout   . Grade I diastolic dysfunction 63/14/9702   Noted ECHO  . Hemorrhoids   . History of adenomatous polyp of colon   . History of basal cell carcinoma (BCC) excision 2015   nose  . History of sepsis 05/14/2014   post lumbar surgery  . History of shingles    x2  . Hyperlipidemia   . Hypertension   . Hypothyroidism   . Irregular heart beat    history of while undergoing radiation  . Malignant neoplasm of upper-inner quadrant of left breast in female, estrogen receptor positive [facility]) oncologist-  dr Jana Hakim--  per lov in epic no recurrence   dx 07-13-2015  Left breast invasive ductal carcinoma, Stage IA, Grade 1 (TXN0),  09-16-2015  s/p  left breast lumpectomy with sln bx's,  completed radiation 11-18-2015,  started antiestrogen therapy 12-14-2015  . Neuropathy    Left foot and back of left leg  . Obesity (BMI 30-39.9) 07/05/2019  . Personal history of radiation therapy    completed 11-18-2015  left  breast  . Pneumonia   . PONV (postoperative nausea and vomiting)   . Prediabetes    diet controlled, no med  . Restless leg syndrome   . Seasonal allergies   . Seizure ([place]) 05/2015   due to sepsis, only one time episode  . Tremor   . Wears partial dentures    lower     Past Surgical History:  Procedure Laterality Date  . ABDOMINAL HYSTERECTOMY  1985  . BASAL CELL CARCINOMA EXCISION  10/15   nose  . BREAST BIOPSY Right 08/24/2015  . BREAST BIOPSY Right 07/13/2015  . BREAST BIOPSY Left 07/13/2015  . BREAST EXCISIONAL BIOPSY Left 1972  . BREAST LUMPECTOMY Left   . BREAST LUMPECTOMY WITH RADIOACTIVE SEED AND SENTINEL LYMPH NODE BIOPSY Left 09/16/2015   Procedure: BREAST LUMPECTOMY WITH RADIOACTIVE SEED AND SENTINEL LYMPH NODE BIOPSY;  Surgeon: Stark Klein, MD;  Location: [facility];  Service: General;  Laterality: Left;  . BREAST REDUCTION SURGERY Bilateral 1998  . CATARACT EXTRACTION W/ INTRAOCULAR LENS IMPLANT Left 2016  . [place]   hallo  . COLONOSCOPY  01/2013   polyps  . EYE SURGERY Left 2016  . HAND SURGERY Left 02-19-2002   dr Fredna Dow  '@[facility]'    repair collateral ligament/ MPJ left thumb  . HARDWARE REMOVAL N/A 07/24/2019   Procedure: REVISION OF HARDWARE Lumbar two -  Sacral one. Extention of Fusion to Lumbar one;  Surgeon: [name], MD;  Location: [facility];  Service: Neurosurgery;  Laterality: N/A;  . HEMORRHOID SURGERY  2008  . HIGH RESOLUTION ANOSCOPY N/A 02/28/2018   Procedure: HIGH RESOLUTION ANOSCOPY WITH BIOPSY;  Surgeon: Leighton Ruff, MD;  Location: [facility];  Service: General;  Laterality: N/A;  . KNEE ARTHROSCOPY Right 2002  . LUMBAR SPINE SURGERY  03-20-2015   dr Kathyrn Sheriff   fusion L4-5, L5-S1  . MOUTH SURGERY  07/14/2018   2 infected teeth removed  . MULTIPLE TOOTH EXTRACTIONS     with bone grafting lower bottom right and left  . REDUCTION MAMMAPLASTY Bilateral   . RIGHT COLECTOMY  09-10-2003   dr  Margot Chimes '@[email or web address]'    multiple colon polyps (per path tubular adenoma's, hyperplastic , benign appendix, benign two lymph nodes  . ROTATOR CUFF REPAIR Left 04/2016  . TONSILLECTOMY  1969  . TUBAL LIGATION Bilateral 1982    Vitals:   04/02/20 1426  SpO2: 93%                                  PT Short Term Goals - 01/31/20 1200      PT SHORT TERM GOAL #1   Title Independent with HEP    Time 3    Period Weeks    Status Achieved      PT SHORT TERM GOAL #2   Title Improve TUG time (with RW) at least 3-5 sec for decreased fall risk    Baseline 11 sec    Time 3    Period Weeks    Status Achieved      PT SHORT TERM GOAL #3   Title Ambulate at least 100 feet CGA with SPC for progression to LTG for gait    Baseline met/able    Time 3    Period Weeks    Status Achieved             PT Long Term Goals - 03/17/20 1431      PT LONG TERM GOAL #1   Title Improve [place] Gait + Balance at least 3-5 points for decreased fall risk    Baseline 20/28    Time 6    Period Weeks    Status On-going    Target Date 04/28/20      PT LONG TERM GOAL #2   Title Increase left hip and ankle strength at least grossly 1/2 MMT grade to improve ability to ascend/descend stairs to 2nd floor bedroom    Baseline see objective-partially met    Time 6    Period Weeks    Status Partially Met    Target Date 04/28/20      PT LONG TERM GOAL #3   Title Perform household level ambulation 150 feet or less mod I with SPC vs. LRAD    Baseline reports ambulating without AD 80% of the time at home, progressing    Time 6    Period Weeks    Status On-going    Target Date 04/28/20      PT LONG TERM GOAL #4   Title Improve 5 times sit<>stand time to 14 sec or less for improved ability for transfers from low seats    Baseline 10.8 sec    Time 6    Period Weeks    Status Achieved      PT LONG TERM GOAL #  5   Title Tolerate standing for periods at least 15-20 min for cooking and light  chores with LBP 4/10 or less    Baseline 4/10 pre-tx. but intermittently higher with activity    Time 6    Period Weeks    Status On-going    Target Date 04/28/20                  Patient will benefit from skilled therapeutic intervention in order to improve the following deficits and impairments:  Abnormal gait,Decreased balance,Decreased endurance,Difficulty walking,Impaired sensation,Pain,Impaired flexibility,Decreased strength,Decreased activity tolerance  Visit Diagnosis: Chronic bilateral low back pain, unspecified whether sciatica present  Muscle weakness (generalized)  Other abnormalities of gait and mobility     Problem List Patient Active Problem List   Diagnosis Date Noted  . Short-term memory loss 02/24/2020  . Peripheral edema 02/24/2020  . Osteopenia 01/22/2020  . Aortic atherosclerosis ([place]) 01/22/2020  . Wheezing 01/22/2020  . Diarrhea 01/22/2020  . Insomnia 10/29/2019  . Recurrent falls 09/06/2019  . Confusion, postoperative   . Benign essential HTN   . Post-operative pain   . Chronic constipation   . 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 Post op infection 07/11/2019  . Muscle spasm   . Hypokalemia   . Status post lumbar spinal fusion 07/10/2019  . Postoperative pain   . Acute blood loss anemia   . Labile blood pressure   . Tobacco abuse   . Hypoalbuminemia due to protein-calorie malnutrition ([place])   . Anxiety 07/05/2019  . Depression 07/05/2019  . Chronic diastolic CHF (congestive heart failure) ([place]) 07/05/2019  . Obesity (BMI 30-39.9) 07/05/2019  . ETOH abuse 07/05/2019  . Lumbar radiculopathy 06/28/2019  . Spinal stenosis of lumbar region 06/28/2019  . Acute on chronic anemia   . Tremors of nervous system   . AKI (acute kidney injury) ([place])   . Chronic pain syndrome   . Acute renal failure superimposed on stage 3 chronic kidney disease ([place]) 06/25/2019  . Acute osteomyelitis of lumbar spine ([place]) 06/25/2019  . Weakness of left  lower extremity   . Abdominal pain 06/24/2019  . Nausea 06/24/2019  . Abnormal urine odor 06/24/2019  . History of sepsis 06/24/2019  . Recent major surgery 06/24/2019  . History of diverticulosis 06/24/2019  . Elevated PTHrP level 05/09/2019  . Paresthesia 02/12/2019  . Low back pain 02/12/2019  . Neuropathy 12/20/2018  . Lumbar spinal stenosis 06/29/2018  . Preoperative clearance 06/04/2018  . Abnormal CT of the chest 06/04/2018  . Smoker 05/17/2018  . Hypothyroidism 11/30/2017  . Thoracic aortic atherosclerosis ([place]) 09/14/2016  . Benign essential tremor 09/09/2016  . RLS (restless legs syndrome) 09/09/2016  . H/O adenomatous polyp of colon 07/06/2016  . PVC's (premature ventricular contractions) 03/29/2016  . Preoperative cardiovascular examination 03/29/2016  . Grade I diastolic dysfunction 28/36/6294  . Palpitations 01/11/2016  . Shortness of breath 01/11/2016  . Former cigarette smoker 01/11/2016  . Leg edema 01/11/2016  . Chronic cough 10/28/2015  . 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 Malignant neoplasm of upper-inner quadrant of left breast in female, estrogen receptor positive ([place]) 07/14/2015  . Prediabetes 07/08/2015  . CKD (chronic kidney disease), stage III 07/08/2015  . Dependent edema 06/08/2015  . Sleep disturbance 06/08/2015  . Mixed hyperlipidemia 06/08/2015  . Generalized anxiety disorder 06/08/2015  . Essential hypertension 06/08/2015  . COPD (chronic obstructive pulmonary disease) ([place]) 06/08/2015  . Gout 06/08/2015  . Macrocytic anemia   . 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  Sepsis ([place]) 05/15/2015  . Lumbar spondylosis 03/20/2015       PHYSICAL THERAPY DISCHARGE SUMMARY  Visits from Start of Care: 21  Current functional level related to goals / functional outcomes: Patient did not return for further therapy after last session 04/02/20. No further PT visits scheduled at this time.   Remaining deficits: LBP with  deconditioning/difficulty walking as of last attended therapy.   Education / Equipment: Prior HEP education Plan: Patient agrees to discharge.  Patient goals were partially met. Patient is being discharged due to not returning since the last visit.  ?????           Beaulah Dinning, PT, DPT 05/26/20 11:38 AM      [facility] [address] [place], [place], [address] Phone: [phone]   Fax:  [phone]  Name: Brandi Stephens MRN: [id number] Date of Birth: [birth year]

## 2020-06-02 ENCOUNTER — Other Ambulatory Visit: Payer: Self-pay | Admitting: Family Medicine

## 2020-06-02 DIAGNOSIS — G2581 Restless legs syndrome: Secondary | ICD-10-CM

## 2020-06-03 NOTE — Telephone Encounter (Signed)
Please advise 

## 2020-06-03 NOTE — Telephone Encounter (Signed)
Pt states that she has been taking 1.5 tablet daily at bedtime for the last 3 years for restless legs. She has an appt tomorrow. Would you like to discuss this with her. She said you were the last to refill this

## 2020-06-03 NOTE — Telephone Encounter (Signed)
Left message for pt to call me back 

## 2020-06-03 NOTE — Telephone Encounter (Signed)
Please see if her neurologist refilled this for her and if so, decline.

## 2020-06-03 NOTE — Telephone Encounter (Signed)
Ok to refill. Just wanted to make sure we did not duplicate in case her neurologist refilled it.

## 2020-06-04 ENCOUNTER — Ambulatory Visit (INDEPENDENT_AMBULATORY_CARE_PROVIDER_SITE_OTHER): Payer: PPO | Admitting: Family Medicine

## 2020-06-04 ENCOUNTER — Other Ambulatory Visit: Payer: Self-pay

## 2020-06-04 VITALS — BP 130/70 | HR 100 | Wt 276.0 lb

## 2020-06-04 DIAGNOSIS — I700 Atherosclerosis of aorta: Secondary | ICD-10-CM | POA: Diagnosis not present

## 2020-06-04 DIAGNOSIS — J439 Emphysema, unspecified: Secondary | ICD-10-CM | POA: Diagnosis not present

## 2020-06-04 DIAGNOSIS — R7303 Prediabetes: Secondary | ICD-10-CM

## 2020-06-04 DIAGNOSIS — I10 Essential (primary) hypertension: Secondary | ICD-10-CM

## 2020-06-04 DIAGNOSIS — R609 Edema, unspecified: Secondary | ICD-10-CM | POA: Diagnosis not present

## 2020-06-04 DIAGNOSIS — E041 Nontoxic single thyroid nodule: Secondary | ICD-10-CM | POA: Diagnosis not present

## 2020-06-04 DIAGNOSIS — I5032 Chronic diastolic (congestive) heart failure: Secondary | ICD-10-CM

## 2020-06-04 DIAGNOSIS — R9389 Abnormal findings on diagnostic imaging of other specified body structures: Secondary | ICD-10-CM

## 2020-06-04 NOTE — Progress Notes (Signed)
Subjective:    Patient ID: Brandi Stephens, female    DOB: 27-Feb-1946, 75 y.o.   MRN: [id number]  HPI Chief Complaint  Patient presents with  . Follow-up    Follow-up on CT results   She is here to follow up on abnormal chest CT which was done due to elevated D-dimer and shortness of breath. Per CT, she has a 1.6 cm thyroid nodule on left inferior thyroid that needs a dedicated thyroid US.   States today she was able to get herself ready and drive to this appointment. She is ambulating with a cane today which is an improvement.   Reports her breathing is better and she is feeling better overall except for continued bilateral LE edema.   She completed a course of oral steroids 5 days ago and antibiotics due to recent URI, suspected Covid infection.   Taking Lasix 20 mg daily. History of hyperkalemia. She is not on a potassium supplement currently.   History of emphysema. Has not followed up with pulmonologist since 2020. She was due to have a follow up last year but did not schedule.   HTN and CHF history. Has not followed up recently with her cardiologist.   Denies fever, chills, dizziness, headache, chest pain, palpitations, shortness of breath, abdominal pain, N/V/D, urinary symptoms.    Reviewed allergies, medications, past medical, surgical, family, and social history.    Review of Systems Pertinent positives and negatives in the history of present illness.     Objective:   Physical Exam Constitutional:      General: She is not in acute distress.    Appearance: She is diaphoretic. She is not ill-appearing.  Eyes:     Conjunctiva/sclera: Conjunctivae normal.  Cardiovascular:     Rate and Rhythm: Normal rate and regular rhythm.     Pulses: Normal pulses.     Heart sounds: Normal heart sounds.  Pulmonary:     Effort: Pulmonary effort is normal.     Breath sounds: Normal breath sounds.  Musculoskeletal:     Cervical back: Neck supple. No tenderness.     Right  lower leg: 1+ Pitting Edema present.     Left lower leg: 1+ Pitting Edema present.  Lymphadenopathy:     Cervical: No cervical adenopathy.  Skin:    General: Skin is warm.     Capillary Refill: Capillary refill takes less than 2 seconds.  Neurological:     General: No focal deficit present.     Mental Status: She is alert and oriented to person, place, and time.     Cranial Nerves: No cranial nerve deficit.  Psychiatric:        Mood and Affect: Mood normal.        Behavior: Behavior normal.        Thought Content: Thought content normal.    BP 130/70   Pulse 100   Wt 276 lb (125.2 kg)   SpO2 96%   BMI 42.59 kg/m        Assessment & Plan:  Peripheral edema - Plan: CBC with Differential/Platelet, Comprehensive metabolic panel, Ambulatory referral to Cardiology -Suspect her edema is multifactorial including recent steroid use and sedentary lifestyle.  She does have a history of CHF.  She will continue Lasix for now.  Avoid salt.  Referral back to cardiology.  Check labs and follow-up  Pulmonary emphysema, unspecified emphysema type ([place]) - Plan: Ambulatory referral to Pulmonology -Recent URI and she tested negative for Covid however she  did have all of the symptoms and several family members tested positive for Covid.  Reports breathing overall has improved.  She will continue with albuterol use as needed.  She was due for pulmonology follow-up last year but this was not done.  I am referring her back to pulmonology for evaluation.  Chronic diastolic CHF (congestive heart failure) ([place]) - Plan: Ambulatory referral to Cardiology -Continue Lasix and avoid salt or excessive fluid intake.  Referral back to cardiology for further evaluation  Benign essential HTN - Plan: Ambulatory referral to Cardiology -Blood pressure controlled  Aortic atherosclerosis ([place]) - Plan: Ambulatory referral to Cardiology -Continue statin therapy.  Thyroid nodule incidentally noted on imaging study -  Plan: TSH, T4, free, US THYROID -Ultrasound ordered due to thyroid nodule found on CT chest.  Thoracic aortic atherosclerosis ([place]) - Plan: Ambulatory referral to Cardiology -Continue statin therapy  Abnormal CT of the chest - Plan: US THYROID, Ambulatory referral to Pulmonology IMPRESSION: 1. No evidence of pulmonary embolism. 2. Moderate centrilobular emphysema with scattered tracheal debris. 3. Scattered opacities in the RIGHT middle lobe and lingular are improved in comparison to prior and likely reflect sequela of prior infection. 4. There is a 1.6 cm thyroid nodule of the LEFT inferior thyroid. Nonemergent thyroid ultrasound is recommended if not previously performed (ref: J Am Coll Radiol. 2015 Feb;12(2): 143-50).  Aortic Atherosclerosis (ICD10-I70.0) and Emphysema (ICD10-J43.9).  Electronically Signed   By: Valentino Saxon MD   On: 05/22/2020 09:12  Prediabetes - Plan: Comprehensive metabolic panel, TSH, T4, free, Hemoglobin A1c -Check labs and follow-up

## 2020-06-05 ENCOUNTER — Ambulatory Visit
Admission: RE | Admit: 2020-06-05 | Discharge: 2020-06-05 | Disposition: A | Discharge status: Home / Self Care | Payer: PPO | Source: Ambulatory Visit | Attending: Family Medicine | Admitting: Family Medicine

## 2020-06-05 DIAGNOSIS — R9389 Abnormal findings on diagnostic imaging of other specified body structures: Secondary | ICD-10-CM

## 2020-06-05 DIAGNOSIS — E041 Nontoxic single thyroid nodule: Secondary | ICD-10-CM | POA: Diagnosis not present

## 2020-06-05 LAB — T4, FREE: Free T4: 1.15 ng/dL (ref 0.82–1.77)

## 2020-06-05 LAB — COMPREHENSIVE METABOLIC PANEL
ALT: 21 IU/L (ref 0–32)
AST: 19 IU/L (ref 0–40)
Albumin/Globulin Ratio: 2 (ref 1.2–2.2)
Albumin: 4.5 g/dL (ref 3.7–4.7)
Alkaline Phosphatase: 111 IU/L (ref 44–121)
BUN/Creatinine Ratio: 25 (ref 12–28)
BUN: 30 mg/dL — ABNORMAL HIGH (ref 8–27)
Bilirubin Total: 0.2 mg/dL (ref 0.0–1.2)
CO2: 20 mmol/L (ref 20–29)
Calcium: 9.6 mg/dL (ref 8.7–10.3)
Chloride: 103 mmol/L (ref 96–106)
Creatinine, Ser: 1.19 mg/dL — ABNORMAL HIGH (ref 0.57–1.00)
GFR calc Af Amer: 52 mL/min/{1.73_m2} — ABNORMAL LOW (ref >59)
GFR calc non Af Amer: 45 mL/min/{1.73_m2} — ABNORMAL LOW (ref >59)
Globulin, Total: 2.2 g/dL (ref 1.5–4.5)
Glucose: 186 mg/dL — ABNORMAL HIGH (ref 65–99)
Potassium: 5.1 mmol/L (ref 3.5–5.2)
Sodium: 142 mmol/L (ref 134–144)
Total Protein: 6.7 g/dL (ref 6.0–8.5)

## 2020-06-05 LAB — CBC WITH DIFFERENTIAL/PLATELET
Basophils Absolute: 0.1 10*3/uL (ref 0.0–0.2)
Basos: 1 %
EOS (ABSOLUTE): 0.3 10*3/uL (ref 0.0–0.4)
Eos: 3 %
Hematocrit: 38.6 % (ref 34.0–46.6)
Hemoglobin: 13 g/dL (ref 11.1–15.9)
Immature Grans (Abs): 0.3 10*3/uL — ABNORMAL HIGH (ref 0.0–0.1)
Immature Granulocytes: 3 %
Lymphocytes Absolute: 1.2 10*3/uL (ref 0.7–3.1)
Lymphs: 14 %
MCH: 33 pg (ref 26.6–33.0)
MCHC: 33.7 g/dL (ref 31.5–35.7)
MCV: 98 fL — ABNORMAL HIGH (ref 79–97)
Monocytes Absolute: 0.6 10*3/uL (ref 0.1–0.9)
Monocytes: 6 %
Neutrophils Absolute: 6.6 10*3/uL (ref 1.4–7.0)
Neutrophils: 73 %
Platelets: 226 10*3/uL (ref 150–450)
RBC: 3.94 x10E6/uL (ref 3.77–5.28)
RDW: 13.3 % (ref 11.7–15.4)
WBC: 9.1 10*3/uL (ref 3.4–10.8)

## 2020-06-05 LAB — HEMOGLOBIN A1C
Est. average glucose Bld gHb Est-mCnc: 137 mg/dL
Hgb A1c MFr Bld: 6.4 % — ABNORMAL HIGH (ref 4.8–5.6)

## 2020-06-05 LAB — TSH: TSH: 3.53 u[IU]/mL (ref 0.450–4.500)

## 2020-06-07 ENCOUNTER — Encounter: Payer: Self-pay | Admitting: Family Medicine

## 2020-06-08 ENCOUNTER — Encounter: Payer: Self-pay | Admitting: Pulmonary Disease

## 2020-06-08 ENCOUNTER — Ambulatory Visit: Payer: PPO | Admitting: Pulmonary Disease

## 2020-06-08 ENCOUNTER — Other Ambulatory Visit: Payer: Self-pay

## 2020-06-08 VITALS — BP 126/76 | HR 100 | Temp 97.0°F | Ht 67.0 in | Wt 278.8 lb

## 2020-06-08 DIAGNOSIS — J432 Centrilobular emphysema: Secondary | ICD-10-CM

## 2020-06-08 MED ORDER — FLUTICASONE-SALMETEROL 250-50 MCG/DOSE IN AEPB: 1.0000 | INHALATION_SPRAY | Freq: Two times a day (BID) | RESPIRATORY_TRACT | 11 refills | Status: DC

## 2020-06-08 NOTE — Patient Instructions (Signed)
Fluticasone-salmeterol (advair) one puff in the morning and one puff in the evening, and rinse your mouth after each use  Will arrange for pulmonary function test  Follow up with Dr. Ander Slade in 8 weeks

## 2020-06-08 NOTE — Progress Notes (Signed)
Brandi Stephens, Critical Care, and Sleep Medicine  Chief Complaint  Patient presents with  . Follow-up    Non productive cough     Constitutional:  BP 126/76 (BP Location: Right Arm, Cuff Size: Normal)   Pulse 100   Temp (!) 97 F (36.1 C) (Temporal)   Ht 5\' 7"  (1.702 m)   Wt 278 lb 12.8 oz (126.5 kg)   SpO2 95% Comment: Room air  BMI 43.67 kg/m   Past Medical History:  Anemia, Anxiety, OA, Constipation, Diastolic CHF, CKD, Tremors, Depression, ETOH, GERD, Gout, Colon polyps, Shingles, HLD, HTN, Hypthyroidism, Breast cancer, Neuropathy, Pneumonia, RLS, Allergies, Seizure in 2017  Past Surgical History:  Brandi Stephens  has a past surgical history that includes Excision basal cell carcinoma (10/15); Breast reduction surgery (Bilateral, 1998); Cervical spine surgery (1995); Knee arthroscopy (Right, 2002); Hemorrhoid surgery (2008); Hand surgery (Left, 02-19-2002   dr Fredna Dow  @[facility] ); Breast lumpectomy with radioactive seed and sentinel lymph node biopsy (Left, 09/16/2015); Breast excisional biopsy (Left, 1972); Rotator cuff repair (Left, 04/2016); Lumbar spine surgery (03-20-2015   dr Kathyrn Sheriff); Colonoscopy (01/2013); Multiple tooth extractions; Breast biopsy (Right, 08/24/2015); Breast biopsy (Right, 07/13/2015); Breast biopsy (Left, 07/13/2015); Abdominal hysterectomy (1985); Right colectomy (09-10-2003   dr Margot Chimes @[facility] ); Tubal ligation (Bilateral, 1982); Tonsillectomy (1969); Eye surgery (Left, 2016); Cataract extraction w/ intraocular lens implant (Left, 2016); High resolution anoscopy (N/A, 02/28/2018); Mouth surgery (07/14/2018); Breast lumpectomy (Left); Reduction mammaplasty (Bilateral); and Hardware Removal (N/A, 07/24/2019).  Brief Summary:  Brandi Stephens is a 75 y.o. female former smoker with emphysema.      Subjective:   Brandi Stephens is followed by Dr. Ander Slade.  Most recently seen by Tammy Parrett.   Brandi Stephens was having trouble with dyspnea, cough, and swelling.  Brandi Stephens had chest xray and then  CT chest.  CT chest showed moderate emphysema.  Brandi Stephens was advised to have Stephens follow up.  Cough is better.  Still gets winded when walking up stairs, but okay on level ground.  Not having wheeze, sputum, chest pain, or hemoptysis.  Has ankle swelling.  Has appointment with cardiology scheduled to assess this.  Previously used breo and advair.  These both helped, but advair seemed to work better.  Uses albuterol couples times per week.  Eosinophil count from CBC on 06/04/20 was 0.3 x 10E3/uL.  Brandi Stephens snores some, but otherwise doesn't feel like Brandi Stephens has any issues with her sleep.  Physical Exam:   Appearance - well kempt   ENMT - no sinus tenderness, no oral exudate, no LAN, Mallampati 3 airway, no stridor  Respiratory - equal breath sounds bilaterally, no wheezing or rales  CV - s1s2 regular rate and rhythm, no murmurs  Ext - no clubbing, ankle edema  Skin - no rashes  Psych - normal mood and affect   Stephens testing:   PFT 06/04/18 >> FEV1 2.16 (85%), FEV1% 73, TLC 6.06 (110%), DLCO 74%  Chest Imaging:   CT angio chest 05/22/20 >> 1.6 cm Lt thyroid nodule, moderate centrilobular emphysema, post XRT changes Lt lung, subpleural GGO RML and lingula improved  Cardiac Tests:   Echo 01/22/16 >> EF 65 to 70%, mild LVH, grade 1 DD  Social History:  Brandi Stephens  reports that Brandi Stephens quit smoking about 2 years ago. Her smoking use included cigarettes. Brandi Stephens has a 17.50 pack-year smoking history. Brandi Stephens has never used smokeless tobacco. Brandi Stephens reports current alcohol use. Brandi Stephens reports previous drug use.  Family History:  Her family history includes Atrial fibrillation in her  mother; Brain cancer in her maternal aunt; Breast cancer in her maternal aunt; Lung cancer in her maternal aunt and maternal uncle; Prostate cancer in her maternal grandfather; Stomach cancer in her maternal grandmother; Stroke in her paternal grandmother; Tuberculosis in her paternal grandfather; Ulcers in her father.     Discussion:  Brandi Stephens has dyspnea on exertion.  Likely a combination of obesity and deconditioning after resent back surgeries, emphysema, and diastolic CHF.  Assessment/Plan:   Centrilobular emphysema. - has elevated eosinophil level on recent CBC - will have her try fluticasone-salmeterol bid, and continue prn albuterol - will arrange for repeat PFT - Brandi Stephens will have f/u with Dr. Ander Slade  Chronic diastolic CHF with lower leg edema. - Brandi Stephens has appointment schedule with Dr. Lyman Bishop with [facility] on 06/15/20  History of breast cancer with post radiation changes in Lt lung. - followed by Dr. Lurline Del with [facility]  Obesity with deconditioning. - encouraged her to maintain a regular exercise regimen and work on weight loss  Snoring. - asked her to monitor her sleep pattern - might need further assessment for sleep apnea   Time Spent Involved in Patient Care on Day of Examination:  33 minutes  Follow up:  Patient Instructions  Fluticasone-salmeterol (advair) one puff in the morning and one puff in the evening, and rinse your mouth after each use  Will arrange for Stephens function test  Follow up with Dr. Ander Slade in 8 weeks   Medication List:   Allergies as of 06/08/2020      Reactions   Ampicillin Hives, Other (See Comments)   Severe reaction in February 2017 1.5 month to have hives to go away   Gabapentin Swelling   UNSPECIFIED REACTION    Penicillins Hives, Other (See Comments)   Severe reaction in February 2017 1.5 month to have hives to go away Has patient had a PCN reaction causing immediate rash, facial/tongue/throat swelling, SOB or lightheadedness with hypotension: #  #  #  YES  #  #  #  Has patient had a PCN reaction causing severe rash involving mucus membranes or skin necrosis: No Has patient had a PCN reaction that required hospitalization No Has patient had a PCN reaction occurring within the last 10 years: No   Codeine  Nausea And Vomiting   Other Itching   UNSPECIFIED Analgesics      Medication List       Accurate as of June 08, 2020 10:26 AM. If you have any questions, ask your nurse or doctor.        albuterol 108 (90 Base) MCG/ACT inhaler Commonly known as: VENTOLIN HFA INHALE 2 PUFFS BY MOUTH EVERY 4 TO 6 HOURS AS NEEDED   allopurinol 300 MG tablet Commonly known as: ZYLOPRIM TAKE 1 TABLET(300 MG) BY MOUTH DAILY   amitriptyline 75 MG tablet Commonly known as: ELAVIL TAKE 1 TABLET(75 MG) BY MOUTH AT BEDTIME   anastrozole 1 MG tablet Commonly known as: ARIMIDEX TAKE 1 TABLET(1 MG) BY MOUTH DAILY   atorvastatin 20 MG tablet Commonly known as: LIPITOR TAKE 1 TABLET BY MOUTH EVERY DAY   betamethasone dipropionate 0.05 % cream Apply topically 2 (two) times daily.   Biotin 3 MG Tabs Take by mouth.   cephALEXin 500 MG capsule Commonly known as: KEFLEX TAKE 1 CAPSULE(500 MG) BY MOUTH THREE TIMES DAILY   diazepam 5 MG tablet Commonly known as: Valium Take 1 tablet (5 mg total) by mouth daily as needed for  anxiety.   diclofenac Sodium 1 % Gel Commonly known as: VOLTAREN Apply 4 g topically 4 (four) times daily.   docusate sodium 100 MG capsule Commonly known as: COLACE Take 1 capsule (100 mg total) by mouth 3 (three) times daily.   DULoxetine 60 MG capsule Commonly known as: CYMBALTA TAKE 1 CAPSULE(60 MG) BY MOUTH DAILY   Fluticasone-Salmeterol 250-50 MCG/DOSE Aepb Commonly known as: ADVAIR Inhale 1 puff into the lungs every 12 (twelve) hours. What changed: when to take this Changed by: Chesley Mires, MD   furosemide 20 MG tablet Commonly known as: LASIX TAKE 1 TABLET(20 MG) BY MOUTH DAILY AS NEEDED   HYDROcodone-acetaminophen 5-325 MG tablet Commonly known as: NORCO/VICODIN Take 1 tablet by mouth daily as needed for moderate pain.   levothyroxine 75 MCG tablet Commonly known as: SYNTHROID TAKE 1 TABLET(75 MCG) BY MOUTH DAILY BEFORE BREAKFAST   magnesium  gluconate 500 MG tablet Commonly known as: MAGONATE Take 2 tablets (1,000 mg total) by mouth at bedtime.   metoprolol succinate 25 MG 24 hr tablet Commonly known as: TOPROL-XL Take 1 tablet (25 mg total) by mouth daily.   OMEGA-3 FISH OIL PO Take by mouth.   ondansetron 4 MG tablet Commonly known as: Zofran Take 1 tablet (4 mg total) by mouth daily as needed for nausea or vomiting.   polyethylene glycol powder 17 GM/SCOOP powder Commonly known as: GLYCOLAX/MIRALAX Take 17 g by mouth daily as needed for moderate constipation.   primidone 50 MG tablet Commonly known as: MYSOLINE Take 1 tablet (50 mg total) by mouth 2 (two) times daily. TAKE 1 TABLET(50 MG) BY MOUTH AT BEDTIME   PROBIOTIC DAILY PO Take by mouth.   QUEtiapine 25 MG tablet Commonly known as: SEROQUEL Take 1 tablet (25 mg total) by mouth at bedtime.   rOPINIRole 0.5 MG tablet Commonly known as: REQUIP TAKE 1 AND 1/2 TABLETS(0.75 MG) BY MOUTH AT BEDTIME   vitamin B-12 1000 MCG tablet Commonly known as: CYANOCOBALAMIN Take 1 tablet (1,000 mcg total) by mouth daily.   VITAMIN D (CHOLECALCIFEROL) PO Take by mouth.   D3 ADULT PO Take by mouth.       Signature:  Chesley Mires, MD [place] Pager - [phone] 06/08/2020, 10:26 AM

## 2020-06-10 ENCOUNTER — Ambulatory Visit: Payer: PPO | Admitting: Internal Medicine

## 2020-06-10 ENCOUNTER — Other Ambulatory Visit: Payer: Self-pay

## 2020-06-10 DIAGNOSIS — M4626 Osteomyelitis of vertebra, lumbar region: Secondary | ICD-10-CM

## 2020-06-10 MED ORDER — CEPHALEXIN 500 MG PO CAPS
ORAL_CAPSULE | ORAL | 1 refills | Status: DC
Start: 2020-06-10 — End: 2020-09-24

## 2020-06-10 NOTE — Progress Notes (Signed)
[facility]  Patient Active Problem List   Diagnosis Date Noted  . Recurrent falls 09/06/2019    Priority: High  . Status post lumbar spinal fusion 07/10/2019    Priority: High  . Acute osteomyelitis of lumbar spine ([place]) 06/25/2019    Priority: High  . Short-term memory loss 02/24/2020  . Peripheral edema 02/24/2020  . Osteopenia 01/22/2020  . Aortic atherosclerosis ([place]) 01/22/2020  . Wheezing 01/22/2020  . Insomnia 10/29/2019  . Confusion, postoperative   . Benign essential HTN   . Post-operative pain   . Chronic constipation   . 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 Post op infection 07/11/2019  . Muscle spasm   . Hypokalemia   . Postoperative pain   . Acute blood loss anemia   . Labile blood pressure   . Tobacco abuse   . Hypoalbuminemia due to protein-calorie malnutrition ([place])   . Anxiety 07/05/2019  . Depression 07/05/2019  . Chronic diastolic CHF (congestive heart failure) ([place]) 07/05/2019  . Obesity (BMI 30-39.9) 07/05/2019  . ETOH abuse 07/05/2019  . Lumbar radiculopathy 06/28/2019  . Spinal stenosis of lumbar region 06/28/2019  . Acute on chronic anemia   . Tremors of nervous system   . AKI (acute kidney injury) ([place])   . Chronic pain syndrome   . Acute renal failure superimposed on stage 3 chronic kidney disease ([place]) 06/25/2019  . Weakness of left lower extremity   . Abdominal pain 06/24/2019  . Nausea 06/24/2019  . Abnormal urine odor 06/24/2019  . History of sepsis 06/24/2019  . Recent major surgery 06/24/2019  . History of diverticulosis 06/24/2019  . Elevated PTHrP level 05/09/2019  . Paresthesia 02/12/2019  . Low back pain 02/12/2019  . Neuropathy 12/20/2018  . Lumbar spinal stenosis 06/29/2018  . Preoperative clearance 06/04/2018  . Abnormal CT of the chest 06/04/2018  . Smoker 05/17/2018  . Hypothyroidism 11/30/2017  . Thoracic aortic atherosclerosis ([place]) 09/14/2016  . Benign essential tremor  09/09/2016  . RLS (restless legs syndrome) 09/09/2016  . H/O adenomatous polyp of colon 07/06/2016  . PVC's (premature ventricular contractions) 03/29/2016  . Preoperative cardiovascular examination 03/29/2016  . Grade I diastolic dysfunction 71/24/5809  . Palpitations 01/11/2016  . Shortness of breath 01/11/2016  . Former cigarette smoker 01/11/2016  . Leg edema 01/11/2016  . Chronic cough 10/28/2015  . 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 Malignant neoplasm of upper-inner quadrant of left breast in female, estrogen receptor positive ([place]) 07/14/2015  . Prediabetes 07/08/2015  . CKD (chronic kidney disease), stage III 07/08/2015  . Dependent edema 06/08/2015  . Sleep disturbance 06/08/2015  . Mixed hyperlipidemia 06/08/2015  . Generalized anxiety disorder 06/08/2015  . Essential hypertension 06/08/2015  . COPD (chronic obstructive pulmonary disease) ([place]) 06/08/2015  . Gout 06/08/2015  . Macrocytic anemia   . 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 Sepsis ([place]) 05/15/2015  . Lumbar spondylosis 03/20/2015    Patient's Medications  New Prescriptions   No medications on file  Previous Medications   ALBUTEROL (VENTOLIN HFA) 108 (90 BASE) MCG/ACT INHALER    INHALE 2 PUFFS BY MOUTH EVERY 4 TO 6 HOURS AS NEEDED   ALLOPURINOL (ZYLOPRIM) 300 MG TABLET    TAKE 1 TABLET(300 MG) BY MOUTH DAILY   AMITRIPTYLINE (ELAVIL) 75 MG TABLET    TAKE 1 TABLET(75 MG) BY MOUTH AT BEDTIME   ANASTROZOLE (ARIMIDEX) 1 MG TABLET    TAKE 1 TABLET(1 MG) BY MOUTH DAILY  ATORVASTATIN (LIPITOR) 20 MG TABLET    TAKE 1 TABLET BY MOUTH EVERY DAY   BETAMETHASONE DIPROPIONATE 0.05 % CREAM    Apply topically 2 (two) times daily.   BIOTIN 3 MG TABS    Take by mouth.   CHOLECALCIFEROL (D3 ADULT PO)    Take by mouth.   DIAZEPAM (VALIUM) 5 MG TABLET    Take 1 tablet (5 mg total) by mouth daily as needed for anxiety.   DICLOFENAC SODIUM (VOLTAREN) 1 % GEL    Apply 4 g topically 4 (four) times daily.   DOCUSATE  SODIUM (COLACE) 100 MG CAPSULE    Take 1 capsule (100 mg total) by mouth 3 (three) times daily.   DULOXETINE (CYMBALTA) 60 MG CAPSULE    TAKE 1 CAPSULE(60 MG) BY MOUTH DAILY   FLUTICASONE-SALMETEROL (ADVAIR) 250-50 MCG/DOSE AEPB    Inhale 1 puff into the lungs every 12 (twelve) hours.   FUROSEMIDE (LASIX) 20 MG TABLET    TAKE 1 TABLET(20 MG) BY MOUTH DAILY AS NEEDED   HYDROCODONE-ACETAMINOPHEN (NORCO/VICODIN) 5-325 MG TABLET    Take 1 tablet by mouth daily as needed for moderate pain.   LEVOTHYROXINE (SYNTHROID) 75 MCG TABLET    TAKE 1 TABLET(75 MCG) BY MOUTH DAILY BEFORE BREAKFAST   MAGNESIUM GLUCONATE (MAGONATE) 500 MG TABLET    Take 2 tablets (1,000 mg total) by mouth at bedtime.   METOPROLOL SUCCINATE (TOPROL-XL) 25 MG 24 HR TABLET    Take 1 tablet (25 mg total) by mouth daily.   OMEGA-3 FATTY ACIDS (OMEGA-3 FISH OIL PO)    Take by mouth.   ONDANSETRON (ZOFRAN) 4 MG TABLET    Take 1 tablet (4 mg total) by mouth daily as needed for nausea or vomiting.   POLYETHYLENE GLYCOL POWDER (GLYCOLAX/MIRALAX) 17 GM/SCOOP POWDER    Take 17 g by mouth daily as needed for moderate constipation.    PRIMIDONE (MYSOLINE) 50 MG TABLET    Take 1 tablet (50 mg total) by mouth 2 (two) times daily. TAKE 1 TABLET(50 MG) BY MOUTH AT BEDTIME   PROBIOTIC PRODUCT (PROBIOTIC DAILY PO)    Take by mouth.   QUETIAPINE (SEROQUEL) 25 MG TABLET    Take 1 tablet (25 mg total) by mouth at bedtime.   ROPINIROLE (REQUIP) 0.5 MG TABLET    TAKE 1 AND 1/2 TABLETS(0.75 MG) BY MOUTH AT BEDTIME   VITAMIN B-12 (CYANOCOBALAMIN) 1000 MCG TABLET    Take 1 tablet (1,000 mcg total) by mouth daily.   VITAMIN D, CHOLECALCIFEROL, PO    Take by mouth.  Modified Medications   Modified Medication Previous Medication   CEPHALEXIN (KEFLEX) 500 MG CAPSULE cephALEXin (KEFLEX) 500 MG capsule      TAKE 1 CAPSULE(500 MG) BY MOUTH THREE TIMES DAILY    TAKE 1 CAPSULE(500 MG) BY MOUTH THREE TIMES DAILY  Discontinued Medications   No medications on file     Subjective: Brandi Stephens is in for her routine follow-up visit. 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 She has been on chronic suppressive cephalexin. She was having problems with diarrhea last November. Her C. difficile screen was negative. Her diarrhea has resolved although she is having some problems with fecal incontinence due to a chronic severe cough. Her back pain is improving slowly. She did have one fall yesterday in her bedroom but states that she has not had multiple falls like she did last November. She is able to ambulate around her home with a  cane and only rarely needs her walker. She feels like she is tolerating cephalexin well now.  Review of Systems: Review of Systems  Constitutional: Positive for malaise/fatigue. Negative for chills, diaphoresis, fever and weight loss.  Respiratory: Positive for cough and shortness of breath. Negative for sputum production.   Cardiovascular: Negative for chest pain.  Gastrointestinal: Negative for abdominal pain, diarrhea, nausea and vomiting.  Musculoskeletal: Positive for back pain.    Past Medical History:  Diagnosis Date  . AIN (anal intraepithelial neoplasia) anal canal   . Anemia    with pregnancy  . Anxiety   . Arthritis    knees, lower back, knees  . Chronic constipation   . Chronic diastolic CHF (congestive heart failure) ([place]) 07/05/2019  . CKD (chronic kidney disease)   . Coarse tremors    Essential  . COPD (chronic obstructive pulmonary disease) ([place])    2017 chest CT, history of  . Degenerative lumbar spinal stenosis   . Depression   . Depression 07/05/2019  . Drug dependency ([place])   . Elevated PTHrP level 05/09/2019  . ETOH abuse 07/05/2019  . GERD (gastroesophageal reflux disease)    pt denies  . Gout   . Grade I diastolic dysfunction 95/18/8416   Noted ECHO  . Hemorrhoids   . History of adenomatous polyp of colon   . History of basal cell  carcinoma (BCC) excision 2015   nose  . History of sepsis 05/14/2014   post lumbar surgery  . History of shingles    x2  . Hyperlipidemia   . Hypertension   . Hypothyroidism   . Irregular heart beat    history of while undergoing radiation  . Malignant neoplasm of upper-inner quadrant of left breast in female, estrogen receptor positive [facility]) oncologist-  dr Jana Hakim--  per lov in epic no recurrence   dx 07-13-2015  Left breast invasive ductal carcinoma, Stage IA, Grade 1 (TXN0),  09-16-2015  s/p  left breast lumpectomy with sln bx's,  completed radiation 11-18-2015,  started antiestrogen therapy 12-14-2015  . Neuropathy    Left foot and back of left leg  . Obesity (BMI 30-39.9) 07/05/2019  . Personal history of radiation therapy    completed 11-18-2015  left breast  . Pneumonia   . PONV (postoperative nausea and vomiting)   . Prediabetes    diet controlled, no med  . Restless leg syndrome   . Seasonal allergies   . Seizure ([place]) 05/2015   due to sepsis, only one time episode  . Tremor   . Wears partial dentures    lower     Social History   Tobacco Use  . Smoking status: Former Smoker    Packs/day: 0.50    Years: 35.00    Pack years: 17.50    Types: Cigarettes    Quit date: 05/22/2018    Years since quitting: 2.0  . Smokeless tobacco: Never Used  Vaping Use  . Vaping Use: Never used  Substance Use Topics  . Alcohol use: Yes    Comment: 1 glass of wine a night  . Drug use: Not Currently    Comment: CBD tincture    Family History  Problem Relation Age of Onset  . Atrial fibrillation Mother   . Ulcers Father   . Breast cancer Maternal Aunt   . Lung cancer Maternal Uncle   . Stomach cancer Maternal Grandmother   . Lung cancer Maternal Aunt   . Brain cancer Maternal Aunt   . Prostate  cancer Maternal Grandfather   . Stroke Paternal Grandmother   . Tuberculosis Paternal Grandfather   . Colon cancer Neg Hx   . Esophageal cancer Neg Hx   . Rectal cancer Neg Hx      Allergies  Allergen Reactions  . Ampicillin Hives and Other (See Comments)    Severe reaction in February 2017 1.5 month to have hives to go away  . Gabapentin Swelling    UNSPECIFIED REACTION   . Penicillins Hives and Other (See Comments)    Severe reaction in February 2017 1.5 month to have hives to go away Has patient had a PCN reaction causing immediate rash, facial/tongue/throat swelling, SOB or lightheadedness with hypotension: #  #  #  YES  #  #  #  Has patient had a PCN reaction causing severe rash involving mucus membranes or skin necrosis: No Has patient had a PCN reaction that required hospitalization No Has patient had a PCN reaction occurring within the last 10 years: No   . Codeine Nausea And Vomiting  . Other Itching    UNSPECIFIED Analgesics    Objective: Vitals:   06/10/20 1121  BP: (!) 162/84  Pulse: (!) 109  Resp: 16  Temp: 98 F (36.7 C)  SpO2: 95%  Weight: 277 lb (125.6 kg)  Height: 5\' 7"  (1.702 m)   Body mass index is 43.38 kg/m.  Physical Exam Constitutional:      Comments: Her spirits are much better than they were last November. She is by herself today.  Cardiovascular:     Rate and Rhythm: Normal rate and regular rhythm.     Heart sounds: No murmur heard.   Pulmonary:     Effort: Pulmonary effort is normal.     Breath sounds: Normal breath sounds. No wheezing, rhonchi or rales.  Abdominal:     Palpations: Abdomen is soft.     Tenderness: There is no abdominal tenderness.  Musculoskeletal:     Right lower leg: Edema present.     Left lower leg: Edema present.  Psychiatric:        Mood and Affect: Mood normal.     Lab Results Sed Rate (mm/h)  Date Value  02/18/2020 22  10/29/2019 19  09/24/2019 29   CRP (mg/L)  Date Value  02/18/2020 15.6 (H)  10/29/2019 9.2 (H)  09/24/2019 17.8 (H)  02/12/2019 <1     Problem List Items Addressed This Visit      High   Acute osteomyelitis of lumbar spine ([place])    She continues  slow improvement after nearly 1 year of therapy for postoperative lumbar infection due to methicillin susceptible coagulase-negative staph. Her C-reactive protein was still elevated last November. She is very reluctant to stop cephalexin and take a chance that her infection could reemerge. We will repeat blood work today and she will come back in 2 months. She will continue cephalexin for now.      Relevant Medications   cephALEXin (KEFLEX) 500 MG capsule   Other Relevant Orders   C-reactive protein   Sedimentation rate       [name], MD [facility] for Infectious [place] [phone] pager   [phone] cell 06/10/2020, 11:53 AM

## 2020-06-10 NOTE — Assessment & Plan Note (Signed)
She continues slow improvement after nearly 1 year of therapy for postoperative lumbar infection due to methicillin susceptible coagulase-negative staph. Her C-reactive protein was still elevated last November. She is very reluctant to stop cephalexin and take a chance that her infection could reemerge. We will repeat blood work today and she will come back in 2 months. She will continue cephalexin for now.

## 2020-06-11 LAB — C-REACTIVE PROTEIN: CRP: 21.6 mg/L — ABNORMAL HIGH (ref <8.0)

## 2020-06-11 LAB — SEDIMENTATION RATE: Sed Rate: 22 mm/h (ref 0–30)

## 2020-06-12 ENCOUNTER — Telehealth: Payer: Self-pay | Admitting: Family Medicine

## 2020-06-12 MED ORDER — FUROSEMIDE 40 MG PO TABS: 40.0000 mg | ORAL_TABLET | Freq: Every day | ORAL | 0 refills | Status: DC

## 2020-06-12 NOTE — Telephone Encounter (Signed)
Trouble with edema with her foot, she states it is worse than when she was in office. She wants to know if need to increase lasix  Please call

## 2020-06-12 NOTE — Telephone Encounter (Signed)
Sent in new rx for 40mg  for 5 days

## 2020-06-12 NOTE — Telephone Encounter (Signed)
I am ok with her increasing her furosemide to 40 mg daily over the next 3-4 days and see if this helps. Make sure she is elevating her legs when she is sitting and limiting her salt intake. Keep me posted.

## 2020-06-15 ENCOUNTER — Other Ambulatory Visit: Payer: Self-pay

## 2020-06-15 ENCOUNTER — Ambulatory Visit: Payer: PPO | Admitting: Internal Medicine

## 2020-06-15 ENCOUNTER — Other Ambulatory Visit: Payer: Self-pay | Admitting: Family Medicine

## 2020-06-15 ENCOUNTER — Encounter: Payer: Self-pay | Admitting: Internal Medicine

## 2020-06-15 VITALS — BP 147/78 | HR 117 | Ht 67.0 in | Wt 274.2 lb

## 2020-06-15 DIAGNOSIS — E782 Mixed hyperlipidemia: Secondary | ICD-10-CM | POA: Diagnosis not present

## 2020-06-15 DIAGNOSIS — R0602 Shortness of breath: Secondary | ICD-10-CM

## 2020-06-15 DIAGNOSIS — R600 Localized edema: Secondary | ICD-10-CM | POA: Diagnosis not present

## 2020-06-15 NOTE — Patient Instructions (Signed)
Medication Instructions:  Your physician recommends that you continue on your current medications as directed. Please refer to the Current Medication list given to you today.  *If you need a refill on your cardiac medications before your next appointment, please call your pharmacy*   Lab Work: BNP, CMET today   If you have labs (blood work) drawn today and your tests are completely normal, you will receive your results only by: Marland Kitchen MyChart Message (if you have MyChart) OR . A paper copy in the mail If you have any lab test that is abnormal or we need to change your treatment, we will call you to review the results.   Testing/Procedures: Your physician has requested that you have an echocardiogram. Echocardiography is a painless test that uses sound waves to create images of your heart. It provides your doctor with information about the size and shape of your heart and how well your heart's chambers and valves are working. This procedure takes approximately one hour. There are no restrictions for this procedure. -- [address]   Follow-Up: At [facility], you and your health needs are our priority.  As part of our continuing mission to provide you with exceptional heart care, we have created designated Provider Care Teams.  These Care Teams include your primary Cardiologist (physician) and Advanced Practice Providers (APPs -  Physician Assistants and Nurse Practitioners) who all work together to provide you with the care you need, when you need it.  We recommend signing up for the patient portal called "MyChart".  Sign up information is provided on this After Visit Summary.  MyChart is used to connect with patients for Virtual Visits (Telemedicine).  Patients are able to view lab/test results, encounter notes, upcoming appointments, etc.  Non-urgent messages can be sent to your provider as well.   To learn more about what you can do with MyChart, go to  [email or web address].    Your next appointment:   4-6 week(s)  The format for your next appointment:   In Person  Provider:   You may see Dr. Debara Pickett or one of the following Advanced Practice Providers on your designated Care Team:    Almyra Deforest, PA-C  Fabian Sharp, [place] or   Roby Lofts, [place]    Other Instructions

## 2020-06-15 NOTE — Telephone Encounter (Signed)
Pt. Requesting refill on seroquel pt. Last apt. 06/04/20

## 2020-06-15 NOTE — Progress Notes (Signed)
OFFICE NOTE  Chief Complaint:  Leg swelling  Primary Care Physician: Girtha Rm, NP-C  HPI:  Brandi Stephens is a 75 y.o. female who is undergoing radiation treatment for breast cancer. She has been feeling palpitations and chest discomfort recently and was referred for evaluation. She has cardiac risk factors, including 35 pack year smoking history, age, hypertension and prediabetes. She reports her episodes of palpitations and associated chest discomfort last for a few minutes and resolve. Not necessarily at exertion or relieved by rest. Recent CT angio showed aortic atherlosclerosis. She also has unexplained tachycardia at rest.  03/29/2016  [name] returns today for follow-up. She underwent nuclear stress test which was negative for ischemia. She also had an echocardiogram which showed normal LV function and mild diastolic dysfunction. She wore a monitor which indicated sinus tachycardia and occasional PVCs. This consistent with her symptoms of intermittent palpitations.  07/05/2016  [name] returns today for follow-up. She underwent successful shoulder surgery without complications. She is undergoing recovery. She is taking low-dose metoprolol and finds that she has very infrequent palpitations. She denies any worsening shortness of breath or chest discomfort. Blood pressure is low normal but she is asymptomatic with this.  06/15/2020  [name] is seen today as a new patient visit.  I did see her in 2018 however she is considered new patient as it has been more than 3 years since I last saw her.  Previously she had issues with palpitations however more recently she has had difficulty with leg swelling.  She has had numerous back surgeries and has peripheral neuropathy.  She had had recent Dopplers which showed no evidence of DVT but has some degree of asymmetric swelling, worse in the left leg than the right.  In general she has 2-3+ edema with venous congestion and  discoloration of her toes.  She denies any shortness of breath.  She is seen by pulmonary and recent BNP was low/negative.  Despite this, she is diagnosed with diastolic heart failure.  PMHx:  Past Medical History:  Diagnosis Date  . AIN (anal intraepithelial neoplasia) anal canal   . Anemia    with pregnancy  . Anxiety   . Arthritis    knees, lower back, knees  . Chronic constipation   . Chronic diastolic CHF (congestive heart failure) ([place]) 07/05/2019  . CKD (chronic kidney disease)   . Coarse tremors    Essential  . COPD (chronic obstructive pulmonary disease) ([place])    2017 chest CT, history of  . Degenerative lumbar spinal stenosis   . Depression   . Depression 07/05/2019  . Drug dependency ([place])   . Elevated PTHrP level 05/09/2019  . ETOH abuse 07/05/2019  . GERD (gastroesophageal reflux disease)    pt denies  . Gout   . Grade I diastolic dysfunction 36/64/4034   Noted ECHO  . Hemorrhoids   . History of adenomatous polyp of colon   . History of basal cell carcinoma (BCC) excision 2015   nose  . History of sepsis 05/14/2014   post lumbar surgery  . History of shingles    x2  . Hyperlipidemia   . Hypertension   . Hypothyroidism   . Irregular heart beat    history of while undergoing radiation  . Malignant neoplasm of upper-inner quadrant of left breast in female, estrogen receptor positive [facility]) oncologist-  dr Jana Hakim--  per lov in epic no recurrence   dx 07-13-2015  Left breast invasive ductal carcinoma,  Stage IA, Grade 1 (TXN0),  09-16-2015  s/p  left breast lumpectomy with sln bx's,  completed radiation 11-18-2015,  started antiestrogen therapy 12-14-2015  . Neuropathy    Left foot and back of left leg  . Obesity (BMI 30-39.9) 07/05/2019  . Personal history of radiation therapy    completed 11-18-2015  left breast  . Pneumonia   . PONV (postoperative nausea and vomiting)   . Prediabetes    diet controlled, no med  . Restless leg syndrome   . Seasonal allergies    . Seizure ([place]) 05/2015   due to sepsis, only one time episode  . Tremor   . Wears partial dentures    lower     Past Surgical History:  Procedure Laterality Date  . ABDOMINAL HYSTERECTOMY  1985  . BASAL CELL CARCINOMA EXCISION  10/15   nose  . BREAST BIOPSY Right 08/24/2015  . BREAST BIOPSY Right 07/13/2015  . BREAST BIOPSY Left 07/13/2015  . BREAST EXCISIONAL BIOPSY Left 1972  . BREAST LUMPECTOMY Left   . BREAST LUMPECTOMY WITH RADIOACTIVE SEED AND SENTINEL LYMPH NODE BIOPSY Left 09/16/2015   Procedure: BREAST LUMPECTOMY WITH RADIOACTIVE SEED AND SENTINEL LYMPH NODE BIOPSY;  Surgeon: Stark Klein, MD;  Location: [facility];  Service: General;  Laterality: Left;  . BREAST REDUCTION SURGERY Bilateral 1998  . CATARACT EXTRACTION W/ INTRAOCULAR LENS IMPLANT Left 2016  . [place]   hallo  . COLONOSCOPY  01/2013   polyps  . EYE SURGERY Left 2016  . HAND SURGERY Left 02-19-2002   dr Fredna Dow  @[facility]    repair collateral ligament/ MPJ left thumb  . HARDWARE REMOVAL N/A 07/24/2019   Procedure: REVISION OF HARDWARE Lumbar two - Sacral one. Extention of Fusion to Lumbar one;  Surgeon: [name], MD;  Location: [facility];  Service: Neurosurgery;  Laterality: N/A;  . HEMORRHOID SURGERY  2008  . HIGH RESOLUTION ANOSCOPY N/A 02/28/2018   Procedure: HIGH RESOLUTION ANOSCOPY WITH BIOPSY;  Surgeon: Leighton Ruff, MD;  Location[facility];  Service: General;  Laterality: N/A;  . KNEE ARTHROSCOPY Right 2002  . LUMBAR SPINE SURGERY  03-20-2015   dr Kathyrn Sheriff   fusion L4-5, L5-S1  . MOUTH SURGERY  07/14/2018   2 infected teeth removed  . MULTIPLE TOOTH EXTRACTIONS     with bone grafting lower bottom right and left  . REDUCTION MAMMAPLASTY Bilateral   . RIGHT COLECTOMY  09-10-2003   dr Margot Chimes @[facility]    multiple colon polyps (per path tubular adenoma's, hyperplastic , benign appendix, benign two lymph nodes  . ROTATOR CUFF REPAIR Left 04/2016  .  TONSILLECTOMY  1969  . TUBAL LIGATION Bilateral 1982    FAMHx:  Family History  Problem Relation Age of Onset  . Atrial fibrillation Mother   . Ulcers Father   . Breast cancer Maternal Aunt   . Lung cancer Maternal Uncle   . Stomach cancer Maternal Grandmother   . Lung cancer Maternal Aunt   . Brain cancer Maternal Aunt   . Prostate cancer Maternal Grandfather   . Stroke Paternal Grandmother   . Tuberculosis Paternal Grandfather   . Colon cancer Neg Hx   . Esophageal cancer Neg Hx   . Rectal cancer Neg Hx     SOCHx:   reports that she quit smoking about 2 years ago. Her smoking use included cigarettes. She has a 17.50 pack-year smoking history. She has never used smokeless tobacco. She reports current alcohol  use. She reports previous drug use.  ALLERGIES:  Allergies  Allergen Reactions  . Ampicillin Hives and Other (See Comments)    Severe reaction in February 2017 1.5 month to have hives to go away  . Gabapentin Swelling    UNSPECIFIED REACTION   . Penicillins Hives and Other (See Comments)    Severe reaction in February 2017 1.5 month to have hives to go away Has patient had a PCN reaction causing immediate rash, facial/tongue/throat swelling, SOB or lightheadedness with hypotension: #  #  #  YES  #  #  #  Has patient had a PCN reaction causing severe rash involving mucus membranes or skin necrosis: No Has patient had a PCN reaction that required hospitalization No Has patient had a PCN reaction occurring within the last 10 years: No   . Codeine Nausea And Vomiting  . Other Itching    UNSPECIFIED Analgesics    ROS: Pertinent items noted in HPI and remainder of comprehensive ROS otherwise negative.  HOME MEDS: Current Outpatient Medications on File Prior to Visit  Medication Sig Dispense Refill  . albuterol (VENTOLIN HFA) 108 (90 Base) MCG/ACT inhaler INHALE 2 PUFFS BY MOUTH EVERY 4 TO 6 HOURS AS NEEDED 42.5 g 0  . allopurinol (ZYLOPRIM) 300 MG tablet TAKE 1  TABLET(300 MG) BY MOUTH DAILY 90 tablet 0  . amitriptyline (ELAVIL) 75 MG tablet TAKE 1 TABLET(75 MG) BY MOUTH AT BEDTIME 30 tablet 3  . anastrozole (ARIMIDEX) 1 MG tablet TAKE 1 TABLET(1 MG) BY MOUTH DAILY 90 tablet 3  . atorvastatin (LIPITOR) 20 MG tablet TAKE 1 TABLET BY MOUTH EVERY DAY 90 tablet 1  . betamethasone dipropionate 0.05 % cream Apply topically 2 (two) times daily.    . Biotin 3 MG TABS Take by mouth.    . cephALEXin (KEFLEX) 500 MG capsule TAKE 1 CAPSULE(500 MG) BY MOUTH THREE TIMES DAILY 90 capsule 1  . Cholecalciferol (D3 ADULT PO) Take by mouth.    . diazepam (VALIUM) 5 MG tablet Take 1 tablet (5 mg total) by mouth daily as needed for anxiety. 30 tablet 0  . diclofenac Sodium (VOLTAREN) 1 % GEL Apply 4 g topically 4 (four) times daily.    [place] Kitchen docusate sodium (COLACE) 100 MG capsule Take 1 capsule (100 mg total) by mouth 3 (three) times daily. 10 capsule 0  . DULoxetine (CYMBALTA) 60 MG capsule TAKE 1 CAPSULE(60 MG) BY MOUTH DAILY 90 capsule 1  . Fluticasone-Salmeterol (ADVAIR) 250-50 MCG/DOSE AEPB Inhale 1 puff into the lungs every 12 (twelve) hours. 60 each 11  . furosemide (LASIX) 20 MG tablet TAKE 1 TABLET(20 MG) BY MOUTH DAILY AS NEEDED 30 tablet 3  . furosemide (LASIX) 40 MG tablet Take 1 tablet (40 mg total) by mouth daily. 5 tablet 0  . HYDROcodone-acetaminophen (NORCO/VICODIN) 5-325 MG tablet Take 1 tablet by mouth daily as needed for moderate pain. 30 tablet 0  . levothyroxine (SYNTHROID) 75 MCG tablet TAKE 1 TABLET(75 MCG) BY MOUTH DAILY BEFORE BREAKFAST 90 tablet 1  . magnesium gluconate (MAGONATE) 500 MG tablet Take 2 tablets (1,000 mg total) by mouth at bedtime. 60 tablet 0  . metoprolol succinate (TOPROL-XL) 25 MG 24 hr tablet Take 1 tablet (25 mg total) by mouth daily. 90 tablet 3  . Omega-3 Fatty Acids (OMEGA-3 FISH OIL PO) Take by mouth.    . ondansetron (ZOFRAN) 4 MG tablet Take 1 tablet (4 mg total) by mouth daily as needed for nausea or vomiting. 30 tablet 1   .  polyethylene glycol powder (GLYCOLAX/MIRALAX) 17 GM/SCOOP powder Take 17 g by mouth daily as needed for moderate constipation.     . primidone (MYSOLINE) 50 MG tablet Take 1 tablet (50 mg total) by mouth 2 (two) times daily. TAKE 1 TABLET(50 MG) BY MOUTH AT BEDTIME 180 tablet 3  . Probiotic Product (PROBIOTIC DAILY PO) Take by mouth.    . QUEtiapine (SEROQUEL) 25 MG tablet Take 1 tablet (25 mg total) by mouth at bedtime. 90 tablet 1  . rOPINIRole (REQUIP) 0.5 MG tablet TAKE 1 AND 1/2 TABLETS(0.75 MG) BY MOUTH AT BEDTIME 140 tablet 1  . vitamin B-12 (CYANOCOBALAMIN) 1000 MCG tablet Take 1 tablet (1,000 mcg total) by mouth daily. 30 tablet 0  . VITAMIN D, CHOLECALCIFEROL, PO Take by mouth.     No current facility-administered medications on file prior to visit.    LABS/IMAGING: No results found for this or any previous visit (from the past 48 hour(s)). No results found.  WEIGHTS: Wt Readings from Last 3 Encounters:  06/15/20 274 lb 3.2 oz (124.4 kg)  06/10/20 277 lb (125.6 kg)  06/08/20 278 lb 12.8 oz (126.5 kg)    VITALS: BP (!) 147/78   Pulse (!) 117   Ht 5\' 7"  (1.702 m)   Wt 274 lb 3.2 oz (124.4 kg)   SpO2 97%   BMI 42.95 kg/m   EXAM: General appearance: alert, no distress and morbidly obese Neck: no carotid bruit, no JVD and thyroid not enlarged, symmetric, no tenderness/mass/nodules Lungs: clear to auscultation bilaterally Heart: regular rate and rhythm Abdomen: soft, non-tender; bowel sounds normal; no masses,  no organomegaly Extremities: edema 2+ left lower extremity 1+ right lower extremity edema, venous congestion Pulses: 2+ and symmetric Skin: Bluish discoloration of the toes Neurologic: Mental status: Alert, oriented, thought content appropriate Psych: Pleasant  EKG: Sinus tachycardia 117-personally reviewed  ASSESSMENT: 1. Palpitations / tachycardia - monitor showed sinus tachy with PVC's (01/2016) 2. Chest discomfort- low risk Myoview and EF 65-70% by  echo (01/2016) 3. Breast CA 4. Hypertension 5. Bilateral leg edema  PLAN: 1.   Mrs. Rajagopalan has significant lower extremity swelling, worse in the left leg than the right.  I suspect this is due to venous insufficiency or combination of neuropathy and that.  Recent BNP was negative.  I like to repeat that as well as a metabolic profile.  She was recently started on Lasix and the dose increased up to 40 mg.  We will recheck labs today to make sure she is not showing evidence of dehydration.  May have to further increase her Lasix.  She would probably benefit from compression stockings but she says they are too difficult to get on.  We could alternatively consider an Unna boot however she does not like to be immobilized.  We will get an echocardiogram to rule out a cardiac cause of her symptoms.  Plan follow-up with me afterwards.   [name], MD, [facility], [place] Director of the [facility]  [facility] Diplomate of the American Board of Clinical Lipidology Attending Cardiologist  Direct Dial: [phone]  Fax: [phone]  Website:  [email or web address] 06/15/2020, 10:05 AM

## 2020-06-16 LAB — COMPREHENSIVE METABOLIC PANEL
ALT: 22 IU/L (ref 0–32)
AST: 22 IU/L (ref 0–40)
Albumin/Globulin Ratio: 1.9 (ref 1.2–2.2)
Albumin: 5 g/dL — ABNORMAL HIGH (ref 3.7–4.7)
Alkaline Phosphatase: 129 IU/L — ABNORMAL HIGH (ref 44–121)
BUN/Creatinine Ratio: 25 (ref 12–28)
BUN: 30 mg/dL — ABNORMAL HIGH (ref 8–27)
Bilirubin Total: 0.4 mg/dL (ref 0.0–1.2)
CO2: 23 mmol/L (ref 20–29)
Calcium: 10.4 mg/dL — ABNORMAL HIGH (ref 8.7–10.3)
Chloride: 93 mmol/L — ABNORMAL LOW (ref 96–106)
Creatinine, Ser: 1.21 mg/dL — ABNORMAL HIGH (ref 0.57–1.00)
Globulin, Total: 2.6 g/dL (ref 1.5–4.5)
Glucose: 193 mg/dL — ABNORMAL HIGH (ref 65–99)
Potassium: 4.8 mmol/L (ref 3.5–5.2)
Sodium: 140 mmol/L (ref 134–144)
Total Protein: 7.6 g/dL (ref 6.0–8.5)
eGFR: 47 mL/min/{1.73_m2} — ABNORMAL LOW (ref >59)

## 2020-06-16 LAB — BRAIN NATRIURETIC PEPTIDE: BNP: 75.4 pg/mL (ref 0.0–100.0)

## 2020-06-18 ENCOUNTER — Telehealth: Payer: Self-pay | Admitting: Internal Medicine

## 2020-06-18 ENCOUNTER — Telehealth: Payer: Self-pay

## 2020-06-18 NOTE — Telephone Encounter (Signed)
Let her know that I communicated with Dr. Debara Pickett about her leg swelling. We are both concerned about her taking more than 40 mg of Lasix daily since it can affect her kidneys. Have her limit any doses higher. He recommends compression stockings and they do not have to be medical grade that are difficult to put on, they can be any compression grade.

## 2020-06-18 NOTE — Telephone Encounter (Signed)
*  STAT* If patient is at the pharmacy, call can be transferred to refill team.   1. Which medications need to be refilled? (please list name of each medication and dose if known)  furosemide (LASIX) 20 MG tablet  2. Which pharmacy/location (including street and city if local pharmacy) is medication to be sent to?  [facility] [place]  3. Do they need a 30 day or 90 day supply? 90 with refills  Patient states Dr. Debara Pickett told her to take 40 mg of lasix daily instead of her usual 20 mg so she has run out of medication faster

## 2020-06-18 NOTE — Telephone Encounter (Signed)
Pt. Aware of your recommendations, she will call back if needed.

## 2020-06-18 NOTE — Telephone Encounter (Signed)
Pt. Called wanting to get a message to you about whats been going on recently with her swelling in her lt. Leg and foot, she said it is still swollen pretty bad but has been seeing cardiology for it they have her on Lasix 40 mg 1 daily and may increase it to two daily if no improvement. She said you can call her if you need to talk to her or anything.

## 2020-06-19 ENCOUNTER — Other Ambulatory Visit: Payer: Self-pay | Admitting: Internal Medicine

## 2020-06-19 ENCOUNTER — Other Ambulatory Visit: Payer: Self-pay | Admitting: Family Medicine

## 2020-06-19 MED ORDER — FUROSEMIDE 20 MG PO TABS: ORAL_TABLET | ORAL | 0 refills | Status: DC

## 2020-06-22 ENCOUNTER — Telehealth: Payer: Self-pay | Admitting: Internal Medicine

## 2020-06-22 NOTE — Telephone Encounter (Signed)
New Message:     Pt wants to know how much Lasix is she supposed to be taking?

## 2020-06-23 MED ORDER — FUROSEMIDE 40 MG PO TABS: 40.0000 mg | ORAL_TABLET | Freq: Every day | ORAL | 3 refills | Status: DC

## 2020-06-23 NOTE — Telephone Encounter (Signed)
Pt called back in today and stated that she is confused on what dose of Lasix she is suppose to be taking .  Is it 40mg  or 80 mgs?    Best number [phone]

## 2020-06-23 NOTE — Telephone Encounter (Signed)
Spoke with pt, script for 20 mg furosemide removed from the chart and refill for 40 mg once daily sent to the pharmacy.

## 2020-06-29 ENCOUNTER — Other Ambulatory Visit: Payer: Self-pay | Admitting: Physical Medicine and Rehabilitation

## 2020-06-29 MED ORDER — AMITRIPTYLINE HCL 75 MG PO TABS: ORAL_TABLET | ORAL | 3 refills | Status: DC

## 2020-06-29 MED ORDER — QUETIAPINE FUMARATE 25 MG PO TABS: ORAL_TABLET | ORAL | 1 refills | Status: DC

## 2020-06-29 MED ORDER — DIAZEPAM 5 MG PO TABS: 5.0000 mg | ORAL_TABLET | Freq: Every day | ORAL | 0 refills | Status: DC | PRN

## 2020-07-07 ENCOUNTER — Other Ambulatory Visit: Payer: Self-pay

## 2020-07-07 ENCOUNTER — Ambulatory Visit (HOSPITAL_COMMUNITY): Payer: PPO | Attending: Cardiovascular Disease

## 2020-07-07 DIAGNOSIS — R0602 Shortness of breath: Secondary | ICD-10-CM | POA: Diagnosis not present

## 2020-07-07 DIAGNOSIS — R600 Localized edema: Secondary | ICD-10-CM | POA: Diagnosis not present

## 2020-07-07 LAB — ECHOCARDIOGRAM COMPLETE
Area-P 1/2: 3.48 cm2
S' Lateral: 3.8 cm

## 2020-07-10 ENCOUNTER — Encounter: Payer: Self-pay | Admitting: Family Medicine

## 2020-07-10 ENCOUNTER — Telehealth: Payer: Self-pay

## 2020-07-10 ENCOUNTER — Telehealth (INDEPENDENT_AMBULATORY_CARE_PROVIDER_SITE_OTHER): Payer: PPO | Admitting: Family Medicine

## 2020-07-10 VITALS — BP 147/78 | Wt 274.0 lb

## 2020-07-10 DIAGNOSIS — R7303 Prediabetes: Secondary | ICD-10-CM | POA: Diagnosis not present

## 2020-07-10 DIAGNOSIS — R609 Edema, unspecified: Secondary | ICD-10-CM

## 2020-07-10 DIAGNOSIS — B370 Candidal stomatitis: Secondary | ICD-10-CM

## 2020-07-10 MED ORDER — NYSTATIN 100000 UNIT/ML MT SUSP: 5.0000 mL | Freq: Four times a day (QID) | OROMUCOSAL | 0 refills | Status: DC

## 2020-07-10 NOTE — Telephone Encounter (Signed)
Pt. Called stating she started a new inhale advair which has caused her thrush, she said she has been rising her mouth out after using the inhaler and she will call her pulmonologists to let them know what's going on as well.  If you could call her in something for the thrush to [facility] on [address].

## 2020-07-10 NOTE — Progress Notes (Signed)
   Subjective:  Documentation for virtual telephone encounter.  The patient was located at home. The provider was located in the office. The patient did consent to this visit and is aware of possible charges through their insurance for this visit.  The other persons participating in this telemedicine service were none. Time spent on call was 13 minutes and in review of previous records 16 minutes total.  This virtual service is not related to other E/M service within previous 7 days.  99441 (5-35min) 99442 (11-37min) 99443 (21-56min)    Patient ID: Brandi Stephens, female    DOB: 09/08/1945, 75 y.o.   MRN: [id number]  HPI Chief Complaint  Patient presents with  . follow up on thrush    Pt was prescribed a oral rinse for this has not started   She reports since starting on Advair prescribed by her pulmonologist, she has been developing a white coating in her mouth.  States she has thrush.  States she has had this in the past when she was using a steroid inhaler.  She does report that she is doing mouth rinses after each use. She has used steroid oral rinse in the past and did fine with it.  She has not contacted her pulmonologist to let them know about this issue.  Denies fever, chills, chest pain, palpitations, shortness of breath  Recently evaluated for peripheral edema and is doing a good job keeping her legs elevated and some days wears compression stockings.   Review of Systems Pertinent positives and negatives in the history of present illness.     Objective:   Physical Exam BP (!) 147/78   Wt 274 lb (124.3 kg)   BMI 42.91 kg/m   Alert and oriented and in no acute distress.  Speaking in complete sentences without difficulty.  Normal speech, mood and memory.      Assessment & Plan:  Oral thrush - Plan: nystatin (MYCOSTATIN) 100000 UNIT/ML suspension  Peripheral edema  Prediabetes  Discussed limitations of a virtual visit.  This was done by telephone  today. She will contact her pulmonologist and let them know about her current symptoms that appears to be related to inhaled steroid.  Continue with good mouth rinses after each use. I will treat her with antifungal rinse.  She will let me know if this is worsening or not improving significantly over the next few days.

## 2020-07-15 ENCOUNTER — Other Ambulatory Visit: Payer: Self-pay | Admitting: Family Medicine

## 2020-07-15 NOTE — Telephone Encounter (Signed)
Ok to refill 

## 2020-07-16 ENCOUNTER — Telehealth: Payer: Self-pay | Admitting: Family Medicine

## 2020-07-16 MED ORDER — COLCHICINE 0.6 MG PO TABS: ORAL_TABLET | ORAL | 0 refills | Status: DC

## 2020-07-16 NOTE — Telephone Encounter (Signed)
Refilled medicine per vickie

## 2020-07-16 NOTE — Telephone Encounter (Signed)
Ok to refill her colchicine

## 2020-07-16 NOTE — Telephone Encounter (Signed)
Pt left VM stating she is having a Gout flare up and the Allopurinol isnt working so she wants a new prescription for Colchicine sent to the [facility] on [address]

## 2020-07-22 ENCOUNTER — Telehealth: Payer: Self-pay | Admitting: Internal Medicine

## 2020-07-22 NOTE — Telephone Encounter (Signed)
Patient called and states she still has a deep cough and its in her chest and she is coughing up yellow mucous. Please advise. She didn't know if you can give her something or not for this

## 2020-07-22 NOTE — Telephone Encounter (Signed)
Pt has virtual tomorrow

## 2020-07-22 NOTE — Telephone Encounter (Signed)
Please schedule a visit. Thanks.

## 2020-07-23 ENCOUNTER — Telehealth (INDEPENDENT_AMBULATORY_CARE_PROVIDER_SITE_OTHER): Payer: PPO | Admitting: Family Medicine

## 2020-07-23 ENCOUNTER — Encounter: Payer: Self-pay | Admitting: Family Medicine

## 2020-07-23 ENCOUNTER — Other Ambulatory Visit: Payer: Self-pay

## 2020-07-23 VITALS — BP 128/72 | HR 79 | Wt 270.0 lb

## 2020-07-23 DIAGNOSIS — R058 Other specified cough: Secondary | ICD-10-CM

## 2020-07-23 MED ORDER — AZITHROMYCIN 250 MG PO TABS: ORAL_TABLET | ORAL | 0 refills | Status: DC

## 2020-07-23 NOTE — Progress Notes (Signed)
   Subjective:  Documentation for virtual telephone encounter.  The patient was located at home. The provider was located in the office. The patient did consent to this visit and is aware of possible charges through their insurance for this visit.  The other persons participating in this telemedicine service were none. Time spent on call was 12 minutes and in review of previous records 15 minutes total.  This virtual service is not related to other E/M service within previous 7 days.  99441 (5-71min) 99442 (11-13min) 99443 (21-64min)    Patient ID: Brandi MUMPOWER, female    DOB: Oct 08, 1945, 76 y.o.   MRN: [id number]  HPI Chief Complaint  Patient presents with  . deep cough    Deep cough, pale yellow mucous, 3 -4 weeks ago   States she has had a dry cough since pneumonia diagnosis several months ago. States her cough changed 2 weeks ago and now is productive of thick yellow sputum. States cough is getting worse.  Denies fever, chills, chest pain, shortness of breath.   Dr. Halford Chessman is her pulmonologist and she has an appt in the near future.   Radioactive poisoning in her mouth from cancer treatment. States she did not have thrush after all.    Review of Systems Pertinent positives and negatives in the history of present illness.     Objective:   Physical Exam BP 128/72   Pulse 79   Wt 270 lb (122.5 kg)   SpO2 97%   BMI 42.29 kg/m   Alert and oriented in no acute distress.  Speaking in complete sentences at difficulty.  Congested cough throughout the visit.      Assessment & Plan:  Productive cough - Plan: azithromycin (ZITHROMAX) 250 MG tablet  Continue Mucinex DM and start Zpak. Drink plenty of water. If she develops shortness of breath or chest pain she will let me know or seek immediate medical care. Contact Dr. Juanetta Gosling office if not improving also.

## 2020-07-27 ENCOUNTER — Telehealth: Payer: Self-pay | Admitting: Internal Medicine

## 2020-07-27 NOTE — Telephone Encounter (Signed)
Spoke to patient Dr.Hilty's advice given.She will keep appointment with Dr.Hilty 5/4 at 2:30 pm.

## 2020-07-27 NOTE — Telephone Encounter (Signed)
I would not recommend increasing lasix further - her BNP has been low, she may be overdiuresed.  Dr. Lemmie Evens

## 2020-07-27 NOTE — Telephone Encounter (Signed)
Pt c/o swelling: STAT is pt has developed SOB within 24 hours  1) How much weight have you gained and in what time span?   2) If swelling, where is the swelling located? L leg from the knee down  3) Are you currently taking a fluid pill? Yes. 40 mg lasix   4) Are you currently SOB? Sometimes after going up the stairs.  5) Do you have a log of your daily weights (if so, list)? No, patient admits she should but she doesn't   6) Have you gained 3 pounds in a day or 5 pounds in a week? Not sure   7) Have you traveled recently? no    Patient was scheduled to see Fabian Sharp 07/29/20 but her appointment was cancelled due to a schedule change. The patient is concerned about waiting until May to be seen regarding her symptoms

## 2020-07-27 NOTE — Telephone Encounter (Signed)
Spoke to patient she stated she has increased swelling in left lower leg.Stated she has alittle swelling in right lower leg, but left leg much worse.She has sob going up stairs.She has appointment with Dr.Hilty 5/4 at 2:30 pm.She wanted to ask Dr.Hilty if she can increase Lasix.Message sent to Dr.Hilty for advice.

## 2020-07-29 ENCOUNTER — Ambulatory Visit: Payer: PPO | Admitting: Physician Assistant

## 2020-08-04 ENCOUNTER — Encounter: Payer: Self-pay | Admitting: Internal Medicine

## 2020-08-04 ENCOUNTER — Ambulatory Visit: Payer: PPO | Admitting: Internal Medicine

## 2020-08-04 ENCOUNTER — Other Ambulatory Visit: Payer: Self-pay

## 2020-08-04 DIAGNOSIS — M4626 Osteomyelitis of vertebra, lumbar region: Secondary | ICD-10-CM | POA: Diagnosis not present

## 2020-08-04 NOTE — Assessment & Plan Note (Signed)
Her C-reactive protein remains elevated.  She prefers to continue cephalexin for now.  She will get repeat lab work today and follow-up in 2 to 3 months.

## 2020-08-04 NOTE — Progress Notes (Addendum)
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[place]) 08/02/2019   Post op infection 07/11/2019   Muscle spasm    Hypokalemia    Postoperative pain    Acute blood loss anemia    Labile blood pressure    Tobacco abuse    Hypoalbuminemia due to protein-calorie malnutrition (HCC)    Anxiety 07/05/2019   Depression 07/05/2019   Chronic diastolic CHF (congestive heart failure) ([place]) 07/05/2019   Obesity (BMI 30-39.9) 07/05/2019   ETOH abuse 07/05/2019   Lumbar radiculopathy 06/28/2019   Spinal stenosis of lumbar region 06/28/2019   Acute on chronic anemia    Tremors of nervous system    AKI (acute kidney injury) ([place])    Chronic pain syndrome    Acute renal failure superimposed on stage 3 chronic kidney disease ([place]) 06/25/2019   Weakness of left lower extremity    Abdominal pain 06/24/2019   Nausea 06/24/2019   Abnormal urine odor 06/24/2019   History of sepsis 06/24/2019   Recent major surgery 06/24/2019   History of diverticulosis 06/24/2019   Elevated PTHrP level 05/09/2019   Paresthesia 02/12/2019   Low back pain 02/12/2019   Neuropathy 12/20/2018   Lumbar spinal stenosis 06/29/2018   Preoperative clearance 06/04/2018   Abnormal CT of the chest 06/04/2018   Smoker 05/17/2018   Hypothyroidism 11/30/2017   Thoracic aortic atherosclerosis ([place]) 09/14/2016   Benign essential tremor 09/09/2016   RLS (restless legs syndrome) 09/09/2016    H/O adenomatous polyp of colon 07/06/2016   PVC's (premature ventricular contractions) 03/29/2016   Preoperative cardiovascular examination 03/29/2016   Grade I diastolic dysfunction 09/32/6712   Palpitations 01/11/2016   Shortness of breath 01/11/2016   Former cigarette smoker 01/11/2016   Leg edema 01/11/2016   Chronic cough 10/28/2015   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   Malignant neoplasm of upper-inner quadrant of left breast in female, estrogen receptor positive ([place]) 07/14/2015   Prediabetes 07/08/2015   CKD (chronic kidney disease), stage III 07/08/2015   Dependent edema 06/08/2015   Sleep disturbance 06/08/2015   Mixed hyperlipidemia 06/08/2015   Generalized anxiety disorder 06/08/2015   Essential hypertension 06/08/2015   COPD (chronic obstructive pulmonary disease) ([place]) 06/08/2015   Gout 06/08/2015   Macrocytic anemia    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[place])    Sepsis ([place]) 05/15/2015   Lumbar spondylosis 03/20/2015    Patient's Medications  New Prescriptions   No medications on file  Previous Medications   ALBUTEROL (VENTOLIN HFA) 108 (90 BASE) MCG/ACT INHALER    INHALE 2 PUFFS BY MOUTH EVERY 4 TO 6 HOURS AS NEEDED   ALLOPURINOL (ZYLOPRIM) 300 MG TABLET    TAKE 1 TABLET(300 MG) BY MOUTH DAILY   AMITRIPTYLINE (ELAVIL) 75 MG TABLET    TAKE 1 TABLET(75 MG) BY MOUTH AT BEDTIME   ANASTROZOLE (ARIMIDEX) 1 MG TABLET    TAKE 1 TABLET(1 MG) BY MOUTH DAILY  ATORVASTATIN (LIPITOR) 20 MG TABLET    TAKE 1 TABLET BY MOUTH EVERY DAY   AZITHROMYCIN (ZITHROMAX) 250 MG TABLET    Take 2 tablets on day 1, then 1 tablet on on days 2-5.   BETAMETHASONE DIPROPIONATE 0.05 % CREAM    Apply topically 2 (two) times daily.   BIOTIN 3 MG TABS    Take by mouth.   CEPHALEXIN (KEFLEX) 500 MG CAPSULE    TAKE 1 CAPSULE(500 MG) BY MOUTH THREE TIMES DAILY   CHOLECALCIFEROL (D3 ADULT PO)    Take by mouth.   DIAZEPAM (VALIUM) 5 MG TABLET    Take 1 tablet (5 mg total) by mouth daily as needed for  anxiety.   DICLOFENAC SODIUM (VOLTAREN) 1 % GEL    Apply 4 g topically 4 (four) times daily.   DOCUSATE SODIUM (COLACE) 100 MG CAPSULE    Take 1 capsule (100 mg total) by mouth 3 (three) times daily.   DULOXETINE (CYMBALTA) 60 MG CAPSULE    TAKE 1 CAPSULE(60 MG) BY MOUTH DAILY   FLUTICASONE-SALMETEROL (ADVAIR) 250-50 MCG/DOSE AEPB    Inhale 1 puff into the lungs every 12 (twelve) hours.   FUROSEMIDE (LASIX) 40 MG TABLET    Take 1 tablet (40 mg total) by mouth daily.   HYDROCODONE-ACETAMINOPHEN (NORCO/VICODIN) 5-325 MG TABLET    Take 1 tablet by mouth daily as needed for moderate pain.   LEVOTHYROXINE (SYNTHROID) 75 MCG TABLET    TAKE 1 TABLET(75 MCG) BY MOUTH DAILY BEFORE BREAKFAST   MAGNESIUM GLUCONATE (MAGONATE) 500 MG TABLET    Take 2 tablets (1,000 mg total) by mouth at bedtime.   METOPROLOL SUCCINATE (TOPROL-XL) 25 MG 24 HR TABLET    Take 1 tablet (25 mg total) by mouth daily.   NYSTATIN (MYCOSTATIN) 100000 UNIT/ML SUSPENSION    Take 5 mLs (500,000 Units total) by mouth 4 (four) times daily.   OMEGA-3 FATTY ACIDS (OMEGA-3 FISH OIL PO)    Take by mouth.   ONDANSETRON (ZOFRAN) 4 MG TABLET    Take 1 tablet (4 mg total) by mouth daily as needed for nausea or vomiting.   POLYETHYLENE GLYCOL POWDER (GLYCOLAX/MIRALAX) 17 GM/SCOOP POWDER    Take 17 g by mouth daily as needed for moderate constipation.    PRIMIDONE (MYSOLINE) 50 MG TABLET    Take 1 tablet (50 mg total) by mouth 2 (two) times daily. TAKE 1 TABLET(50 MG) BY MOUTH AT BEDTIME   PROBIOTIC PRODUCT (PROBIOTIC DAILY PO)    Take by mouth.   QUETIAPINE (SEROQUEL) 25 MG TABLET    TAKE 1 TABLET(25 MG) BY MOUTH AT BEDTIME   ROPINIROLE (REQUIP) 0.5 MG TABLET    TAKE 1 AND 1/2 TABLETS(0.75 MG) BY MOUTH AT BEDTIME   VITAMIN B-12 (CYANOCOBALAMIN) 1000 MCG TABLET    Take 1 tablet (1,000 mcg total) by mouth daily.  Modified Medications   No medications on file  Discontinued Medications   No medications on file    Subjective: Brandi Stephens is in for her  routine follow-up visit. 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 She has been on chronic suppressive cephalexin.  She has had some intermittent loose stools but no persistent watery diarrhea.  She has been having more problems with pedal edema.  She says that this makes it even more difficult to walk which causes her back pain to get worse.  Review of Systems: Review of Systems  Constitutional:  Positive for malaise/fatigue. Negative for chills, diaphoresis,  fever and weight loss.  Respiratory:  Positive for shortness of breath. Negative for cough and sputum production.   Cardiovascular:  Negative for chest pain.  Gastrointestinal:  Positive for diarrhea. Negative for abdominal pain, nausea and vomiting.  Musculoskeletal:  Positive for back pain.   Past Medical History:  Diagnosis Date   AIN (anal intraepithelial neoplasia) anal canal    Anemia    with pregnancy   Anxiety    Arthritis    knees, lower back, knees   Chronic constipation    Chronic diastolic CHF (congestive heart failure) ([place]) 07/05/2019   CKD (chronic kidney disease)    Coarse tremors    Essential   COPD (chronic obstructive pulmonary disease) ([place])    2017 chest CT, history of   Degenerative lumbar spinal stenosis    Depression    Depression 07/05/2019   Drug dependency ([place])    Elevated PTHrP level 05/09/2019   ETOH abuse 07/05/2019   GERD (gastroesophageal reflux disease)    pt denies   Gout    Grade I diastolic dysfunction 58/12/9831   Noted ECHO   Hemorrhoids    History of adenomatous polyp of colon    History of basal cell carcinoma (BCC) excision 2015   nose   History of sepsis 05/14/2014   post lumbar surgery   History of shingles    x2   Hyperlipidemia    Hypertension    Hypothyroidism    Irregular heart beat    history of while undergoing radiation   Malignant neoplasm of upper-inner quadrant of left breast in female, estrogen  receptor positive [facility]) oncologist-  dr Jana Hakim--  per lov in epic no recurrence   dx 07-13-2015  Left breast invasive ductal carcinoma, Stage IA, Grade 1 (TXN0),  09-16-2015  s/p  left breast lumpectomy with sln bx's,  completed radiation 11-18-2015,  started antiestrogen therapy 12-14-2015   Neuropathy    Left foot and back of left leg   Obesity (BMI 30-39.9) 07/05/2019   Personal history of radiation therapy    completed 11-18-2015  left breast   Pneumonia    PONV (postoperative nausea and vomiting)    Prediabetes    diet controlled, no med   Restless leg syndrome    Seasonal allergies    Seizure ([place]) 05/2015   due to sepsis, only one time episode   Tremor    Wears partial dentures    lower     Social History   Tobacco Use   Smoking status: Former    Packs/day: 0.50    Years: 35.00    Pack years: 17.50    Types: Cigarettes    Quit date: 05/22/2018    Years since quitting: 2.4   Smokeless tobacco: Never  Vaping Use   Vaping Use: Never used  Substance Use Topics   Alcohol use: Yes    Comment: 1 glass of wine a night   Drug use: Not Currently    Comment: CBD tincture    Family History  Problem Relation Age of Onset   Atrial fibrillation Mother    Ulcers Father    Breast cancer Maternal Aunt    Lung cancer Maternal Uncle    Stomach cancer Maternal Grandmother    Lung cancer Maternal Aunt    Brain cancer Maternal Aunt    Prostate cancer Maternal Grandfather    Stroke Paternal Grandmother    Tuberculosis Paternal Grandfather    Colon cancer Neg Hx    Esophageal cancer Neg Hx  Rectal cancer Neg Hx     Allergies  Allergen Reactions   Ampicillin Hives and Other (See Comments)    Severe reaction in February 2017 1.5 month to have hives to go away   Gabapentin Swelling    UNSPECIFIED REACTION    Penicillins Hives and Other (See Comments)    Severe reaction in February 2017 1.5 month to have hives to go away Has patient had a PCN reaction causing immediate  rash, facial/tongue/throat swelling, SOB or lightheadedness with hypotension: #  #  #  YES  #  #  #  Has patient had a PCN reaction causing severe rash involving mucus membranes or skin necrosis: No Has patient had a PCN reaction that required hospitalization No Has patient had a PCN reaction occurring within the last 10 years: No    Codeine Nausea And Vomiting   Other Itching    UNSPECIFIED Analgesics    Objective: Vitals:   08/04/20 1405  BP: (!) 147/84  Pulse: 95  Temp: (!) 97.5 F (36.4 C)  TempSrc: Oral  SpO2: 93%  Weight: 281 lb (127.5 kg)   Body mass index is 44.01 kg/m.  Physical Exam Constitutional:      Comments: She is in good spirits but frustrated by the recent problems with edema.  Cardiovascular:     Rate and Rhythm: Normal rate and regular rhythm.     Heart sounds: No murmur heard. Pulmonary:     Effort: Pulmonary effort is normal.     Breath sounds: Normal breath sounds. No wheezing, rhonchi or rales.  Abdominal:     Palpations: Abdomen is soft.     Tenderness: There is no abdominal tenderness.  Musculoskeletal:     Right lower leg: Edema present.     Left lower leg: Edema present.     Comments: She has pitting edema of both lower legs and feet, left greater than right.  Psychiatric:        Mood and Affect: Mood normal.    Lab Results Sed Rate (mm/h)  Date Value  08/04/2020 28  06/10/2020 22  02/18/2020 22   CRP (mg/L)  Date Value  08/04/2020 18.5 (H)  06/10/2020 21.6 (H)  02/18/2020 15.6 (H)  02/12/2019 <1     Problem List Items Addressed This Visit       High   Acute osteomyelitis of lumbar spine (HCC)    Her C-reactive protein remains elevated.  She prefers to continue cephalexin for now.  She will get repeat lab work today and follow-up in 2 to 3 months.       Relevant Orders   C-reactive protein (Completed)   Sedimentation rate (Completed)     [name], MD [facility] [place] [phone] pager   [phone] cell 10/29/2020, 12:40 PM

## 2020-08-05 LAB — C-REACTIVE PROTEIN: CRP: 18.5 mg/L — ABNORMAL HIGH (ref <8.0)

## 2020-08-05 LAB — SEDIMENTATION RATE: Sed Rate: 28 mm/h (ref 0–30)

## 2020-08-07 ENCOUNTER — Ambulatory Visit (INDEPENDENT_AMBULATORY_CARE_PROVIDER_SITE_OTHER): Payer: PPO | Admitting: Family Medicine

## 2020-08-07 ENCOUNTER — Other Ambulatory Visit: Payer: Self-pay

## 2020-08-07 VITALS — BP 124/64 | HR 92 | Wt 274.2 lb

## 2020-08-07 DIAGNOSIS — H6982 Other specified disorders of Eustachian tube, left ear: Secondary | ICD-10-CM

## 2020-08-07 DIAGNOSIS — H26492 Other secondary cataract, left eye: Secondary | ICD-10-CM | POA: Diagnosis not present

## 2020-08-07 NOTE — Patient Instructions (Signed)
Use Afrin nasal spray twice per day for the next 3 to 4 days.  Chew a lot of gum and yawn

## 2020-08-07 NOTE — Progress Notes (Signed)
   Subjective:    Patient ID: Brandi Stephens, female    DOB: 1945/10/08, 75 y.o.   MRN: [id number]  HPI She complains of fullness in left ear,no pain,fever or chills. She is presently on Keflex postoperatively   Review of Systems     Objective:   Physical Exam Alert and in no distress.  Left tympanic membrane is slightly dull but not retracted.  Canal is normal.  Right canal has some cerumen in it but the TM appears normal.       Assessment & Plan:  Dysfunction of left eustachian tube Use Afrin nasal spray twice per day for the next 3 to 4 days.  Chew a lot of gum and yawn  Call if continued difficulty.

## 2020-08-10 ENCOUNTER — Ambulatory Visit: Payer: PPO

## 2020-08-10 ENCOUNTER — Telehealth: Payer: Self-pay | Admitting: Family Medicine

## 2020-08-10 ENCOUNTER — Ambulatory Visit: Payer: PPO | Admitting: Pulmonary Disease

## 2020-08-10 NOTE — Telephone Encounter (Signed)
Pt. Called back and wanted to schedule an apt. Tomorrow with Vickie so I did.

## 2020-08-10 NOTE — Telephone Encounter (Signed)
Pt called sh e is no better from Friday and now her other ear is stopped up also  Please advise

## 2020-08-11 ENCOUNTER — Ambulatory Visit (INDEPENDENT_AMBULATORY_CARE_PROVIDER_SITE_OTHER): Payer: PPO | Admitting: Family Medicine

## 2020-08-11 ENCOUNTER — Encounter: Payer: Self-pay | Admitting: Family Medicine

## 2020-08-11 VITALS — BP 122/70 | HR 71 | Temp 99.0°F

## 2020-08-11 DIAGNOSIS — H6692 Otitis media, unspecified, left ear: Secondary | ICD-10-CM | POA: Diagnosis not present

## 2020-08-11 MED ORDER — LEVOFLOXACIN 500 MG PO TABS: 500.0000 mg | ORAL_TABLET | Freq: Every day | ORAL | 0 refills | Status: AC

## 2020-08-11 NOTE — Progress Notes (Signed)
   Subjective:    Patient ID: Brandi Stephens, female    DOB: June 19, 1945, 75 y.o.   MRN: [id number]  HPI Chief Complaint  Patient presents with  . left ear pain    Left ear pain clear ear. Right ear pain. Feels like she is under water   She is here with complaints of a one week history of left ear pain and feeling clogged. Pain is worsening. She has tried decongestants. Mild post nasal drainage with lingering mild cough.  She is on chronic Keflex since back surgery.   No fever, chills, headache, sinus pain, sore throat, chest pain, shortness of breath.    Review of Systems Pertinent positives and negatives in the history of present illness.     Objective:   Physical Exam Constitutional:      Appearance: Normal appearance. She is not ill-appearing.  HENT:     Right Ear: Tympanic membrane, ear canal and external ear normal.     Left Ear: Ear canal normal. A middle ear effusion is present.     Ears:     Comments: Purulent fluid Eyes:     Conjunctiva/sclera: Conjunctivae normal.     Pupils: Pupils are equal, round, and reactive to light.  Cardiovascular:     Rate and Rhythm: Normal rate.  Pulmonary:     Effort: Pulmonary effort is normal.  Musculoskeletal:     Cervical back: Normal range of motion and neck supple.  Neurological:     Mental Status: She is alert.    BP 122/70   Pulse 71   Temp 99 F (37.2 C)   SpO2 97%        Assessment & Plan:  Acute otitis media, left - Plan: levofloxacin (LEVAQUIN) 500 MG tablet  Levaquin chosen due to PCN allergy and she has been on Keflex. Follow up if worsening or not back to baseline when she completes the antibiotic. She will continue on probiotic or yogurt with live cultures to help prevent C-diff.

## 2020-08-17 ENCOUNTER — Other Ambulatory Visit (HOSPITAL_COMMUNITY): Payer: PPO

## 2020-08-18 ENCOUNTER — Ambulatory Visit: Payer: PPO | Admitting: Physical Medicine and Rehabilitation

## 2020-08-19 ENCOUNTER — Ambulatory Visit: Payer: PPO | Admitting: Internal Medicine

## 2020-08-20 ENCOUNTER — Ambulatory Visit: Payer: PPO | Admitting: Pulmonary Disease

## 2020-09-02 ENCOUNTER — Encounter: Payer: PPO | Attending: Physical Medicine and Rehabilitation | Admitting: Physical Medicine and Rehabilitation

## 2020-09-02 ENCOUNTER — Encounter: Payer: Self-pay | Admitting: Physical Medicine and Rehabilitation

## 2020-09-02 ENCOUNTER — Other Ambulatory Visit: Payer: Self-pay

## 2020-09-02 ENCOUNTER — Other Ambulatory Visit: Payer: Self-pay | Admitting: Physical Medicine and Rehabilitation

## 2020-09-02 DIAGNOSIS — G4701 Insomnia due to medical condition: Secondary | ICD-10-CM

## 2020-09-02 DIAGNOSIS — F419 Anxiety disorder, unspecified: Secondary | ICD-10-CM | POA: Diagnosis not present

## 2020-09-02 DIAGNOSIS — R600 Localized edema: Secondary | ICD-10-CM | POA: Diagnosis not present

## 2020-09-02 MED ORDER — AMITRIPTYLINE HCL 75 MG PO TABS: ORAL_TABLET | ORAL | 3 refills | Status: DC

## 2020-09-02 MED ORDER — LIDOCAINE 5 % EX PTCH: 2.0000 | MEDICATED_PATCH | CUTANEOUS | 0 refills | Status: DC

## 2020-09-02 MED ORDER — QUETIAPINE FUMARATE 25 MG PO TABS: ORAL_TABLET | ORAL | 1 refills | Status: DC

## 2020-09-02 MED ORDER — DIAZEPAM 5 MG PO TABS: 5.0000 mg | ORAL_TABLET | Freq: Every day | ORAL | 0 refills | Status: DC | PRN

## 2020-09-02 NOTE — Progress Notes (Signed)
Subjective:    Patient ID: Brandi Stephens, female    DOB: 03-May-1945, 75 y.o.   MRN: [id number]  Due to national recommendations of social distancing because of COVID 38, an audio/video tele-health visit is felt to be the most appropriate encounter for this patient at this time. See MyChart message from today for the patient's consent to a tele-health encounter with [facility]. This is a follow up tele-visit via phone. The patient is at home. MD is at office.   Brandi Stephens presents for follow-up after CIR admission for staph bacteremia after infection of her spinal hardware. Everything is the same since last visit. She is having more trouble with neuropathy in her feet and hands. The Cymbalta 60mg  does help somewhat. She used to get swelling with Gabapentin. The pain is bad during the day and night. Pain is improved to 4. She cannot feel her two little toes. Discussed Lyrica as an option. She would like try the Sprint PNS. Her back pain is the most severe. She has hardware. She follows with Dr. Bridget Hartshorn at the end of the month. The pain in her back feels like a dagger. The pain is more directly on the spine.   The Amitriptyline helps her to sleep a little better. She has improved sleep from 1 to 4 hours. She is having severe neuropathy in her left foot. The Cymbalta is helping with the neuropathic pain in her hands. She has been tolerating this medication well.   Last visit I transitioned her to outaptient therapy and she has an appointment today. She has noted improvements in her strength. She is ambulating steadily with RW today. She has bene trying using a cane with therapy. Outpatient therapy is doing well. She has been taking 1/2 a tablet of hydrocodone to help her tolerate better (prescribed by her surgeon).   Pain is currently 5/10 and worst in her lower back where her spinal harware is present as well as in paraspinals. No longer acquiring She has been  taking approximately 1 percocet per day and would like to gradually stop using these. She has also been using four 650mg  of Tylenol per day and two 200mg  ibuprofen daily.   She no longer has severe diarrhea or constipation and is much happier about this. It does come and go. She takes 1000mg  magensium at night. She takes colace BID. She likes to eat healthy foods.   Her daughter accompanies her this visit and helps to provide history. She and her daughter have been out of work for 5 years due to her health conditions and need for her daughter's assistance. Her grandchild recently graduated kindergarten.   1) B/l lower extremity edema She continues to have mild bilateral lower extremity edema.  Discussed use of compression garments, elevation, and ice.  Discussed the benefits of lymphedema therapy and she is willing to try this.   2) Bilateral knees: -she has fallen on her knees so many times.  -she has been using blue emu oil. -she has been using voltaren gel.  -she has a history of arthroscopic surgery and steroid injections -she uses turmeric   Pain Inventory Average Pain 4 Pain Right Now 2 My pain is constant, sharp, burning, stabbing and aching  In the last 24 hours, has pain interfered with the following? General activity 4 Relation with others 0 Enjoyment of life 5 What TIME of day is your pain at its worst? varies Sleep (in general) Poor  Pain is  worse with: walking, bending, sitting and standing Pain improves with: rest, heat/ice, therapy/exercise and medication Relief from Meds: 2        Family History  Problem Relation Age of Onset  . Atrial fibrillation Mother   . Ulcers Father   . Breast cancer Maternal Aunt   . Lung cancer Maternal Uncle   . Stomach cancer Maternal Grandmother   . Lung cancer Maternal Aunt   . Brain cancer Maternal Aunt   . Prostate cancer Maternal Grandfather   . Stroke Paternal Grandmother   . Tuberculosis Paternal Grandfather   .  Colon cancer Neg Hx   . Esophageal cancer Neg Hx   . Rectal cancer Neg Hx    Social History   Socioeconomic History  . Marital status: Married    Spouse name: robert  . Number of children: 3  . Years of education: college  . Highest education level: Not on file  Occupational History  . Occupation: retired  Tobacco Use  . Smoking status: Former Smoker    Packs/day: 0.50    Years: 35.00    Pack years: 17.50    Types: Cigarettes    Quit date: 05/22/2018    Years since quitting: 2.2  . Smokeless tobacco: Never Used  Vaping Use  . Vaping Use: Never used  Substance and Sexual Activity  . Alcohol use: Yes    Comment: 1 glass of wine a night  . Drug use: Not Currently    Comment: CBD tincture  . Sexual activity: Yes    Birth control/protection: Post-menopausal  Other Topics Concern  . Not on file  Social History Narrative   Lives with husband in a 2 story home.  Has 2 living children.  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  Education: college.    Right-handed.   Five cups coffee per week.   Social Determinants of Health   Financial Resource Strain: Not on file  Food Insecurity: Not on file  Transportation Needs: Not on file  Physical Activity: Not on file  Stress: Not on file  Social Connections: Not on file   Past Surgical History:  Procedure Laterality Date  . ABDOMINAL HYSTERECTOMY  1985  . BASAL CELL CARCINOMA EXCISION  10/15   nose  . BREAST BIOPSY Right 08/24/2015  . BREAST BIOPSY Right 07/13/2015  . BREAST BIOPSY Left 07/13/2015  . BREAST EXCISIONAL BIOPSY Left 1972  . BREAST LUMPECTOMY Left   . BREAST LUMPECTOMY WITH RADIOACTIVE SEED AND SENTINEL LYMPH NODE BIOPSY Left 09/16/2015   Procedure: BREAST LUMPECTOMY WITH RADIOACTIVE SEED AND SENTINEL LYMPH NODE BIOPSY;  Surgeon: Stark Klein, MD;  Location: [facility];  Service: General;  Laterality: Left;  . BREAST REDUCTION SURGERY Bilateral 1998  . CATARACT EXTRACTION W/ INTRAOCULAR LENS  IMPLANT Left 2016  . [place]   hallo  . COLONOSCOPY  01/2013   polyps  . EYE SURGERY Left 2016  . HAND SURGERY Left 02-19-2002   dr Fredna Dow  @[facility]    repair collateral ligament/ MPJ left thumb  . HARDWARE REMOVAL N/A 07/24/2019   Procedure: REVISION OF HARDWARE Lumbar two - Sacral one. Extention of Fusion to Lumbar one;  Surgeon: [name], MD;  Location: [facility];  Service: Neurosurgery;  Laterality: N/A;  . HEMORRHOID SURGERY  2008  . HIGH RESOLUTION ANOSCOPY N/A 02/28/2018   Procedure: HIGH RESOLUTION ANOSCOPY WITH BIOPSY;  Surgeon: Leighton Ruff, MD;  Location: [facility];  Service: General;  Laterality:  N/A;  . KNEE ARTHROSCOPY Right 2002  . LUMBAR SPINE SURGERY  03-20-2015   dr Kathyrn Sheriff   fusion L4-5, L5-S1  . MOUTH SURGERY  07/14/2018   2 infected teeth removed  . MULTIPLE TOOTH EXTRACTIONS     with bone grafting lower bottom right and left  . REDUCTION MAMMAPLASTY Bilateral   . RIGHT COLECTOMY  09-10-2003   dr Margot Chimes @[facility]    multiple colon polyps (per path tubular adenoma's, hyperplastic , benign appendix, benign two lymph nodes  . ROTATOR CUFF REPAIR Left 04/2016  . TONSILLECTOMY  1969  . TUBAL LIGATION Bilateral 1982   Past Medical History:  Diagnosis Date  . AIN (anal intraepithelial neoplasia) anal canal   . Anemia    with pregnancy  . Anxiety   . Arthritis    knees, lower back, knees  . Chronic constipation   . Chronic diastolic CHF (congestive heart failure) ([place]) 07/05/2019  . CKD (chronic kidney disease)   . Coarse tremors    Essential  . COPD (chronic obstructive pulmonary disease) ([place])    2017 chest CT, history of  . Degenerative lumbar spinal stenosis   . Depression   . Depression 07/05/2019  . Drug dependency ([place])   . Elevated PTHrP level 05/09/2019  . ETOH abuse 07/05/2019  . GERD (gastroesophageal reflux disease)    pt denies  . Gout   . Grade I diastolic dysfunction 0000000   Noted ECHO  . Hemorrhoids    . History of adenomatous polyp of colon   . History of basal cell carcinoma (BCC) excision 2015   nose  . History of sepsis 05/14/2014   post lumbar surgery  . History of shingles    x2  . Hyperlipidemia   . Hypertension   . Hypothyroidism   . Irregular heart beat    history of while undergoing radiation  . Malignant neoplasm of upper-inner quadrant of left breast in female, estrogen receptor positive [facility]) oncologist-  dr Jana Hakim--  per lov in epic no recurrence   dx 07-13-2015  Left breast invasive ductal carcinoma, Stage IA, Grade 1 (TXN0),  09-16-2015  s/p  left breast lumpectomy with sln bx's,  completed radiation 11-18-2015,  started antiestrogen therapy 12-14-2015  . Neuropathy    Left foot and back of left leg  . Obesity (BMI 30-39.9) 07/05/2019  . Personal history of radiation therapy    completed 11-18-2015  left breast  . Pneumonia   . PONV (postoperative nausea and vomiting)   . Prediabetes    diet controlled, no med  . Restless leg syndrome   . Seasonal allergies   . Seizure ([place]) 05/2015   due to sepsis, only one time episode  . Tremor   . Wears partial dentures    lower    There were no vitals taken for this visit.  Opioid Risk Score:   Fall Risk Score:  `1  Depression screen PHQ 2/9  Depression screen [facility] 2/9 08/07/2020 08/05/2020 08/04/2020 06/10/2020 02/20/2020 01/23/2020 01/22/2020  Decreased Interest 0 0 0 0 0 0 0  Down, Depressed, Hopeless 0 0 0 0 0 0 0  PHQ - 2 Score 0 0 0 0 0 0 0  Altered sleeping - - - - - - -  Tired, decreased energy - - - - - - -  Change in appetite - - - - - - -  Feeling bad or failure about yourself  - - - - - - -  Trouble concentrating - - - - - - -  Moving slowly or fidgety/restless - - - - - - -  Suicidal thoughts - - - - - - -  PHQ-9 Score - - - - - - -  Difficult doing work/chores - - - - - - -  Some recent data might be hidden    Review of Systems  Constitutional: Negative.   HENT: Negative.   Eyes: Negative.    Respiratory: Negative.   Cardiovascular: Negative.   Gastrointestinal: Negative.   Endocrine: Negative.   Genitourinary: Negative.   Musculoskeletal: Positive for gait problem.  Skin: Negative.   Allergic/Immunologic: Negative.   Neurological: Positive for dizziness, tremors, weakness and numbness.       Tingling   Hematological: Negative.   Psychiatric/Behavioral: Negative.   All other systems reviewed and are negative.      Objective:   Physical Exam Not performed today since patient was seen via phone.   Assessment & Plan:  Brandi Stephens is a 75 year old female who presents for hospital follow-up after CIR admission post back surgery, complicated by staph infection, now on chronic antibiotics (she is on a daily probiotic- encouraged this to protect her gut). Has had follow-up with neurosurgery.   Diarrhea: This was a huge issue previously and has since improved. I had obtained an XR that showed constipation and recommended an enema. Continue magnesium 1000mg  at night and colace BID. This continues but is improved.   Impaired mobility and ADLs following spinal surgery: -Continue outpatient PT and OT. Progressing well. Increase walking to 10 minutes per day -Recommended exercise bike at home to build back endurance. Continue walking outside as well as strength and gait are improving and she is no longer limited by diarrhea.  Post-operative lower back pain: -Stopped oxycodone- was prescribed 1/2 pill hydrocodone by her her spine surgery.  -Continue to supplement with icing, Tyelnol, and Ibuprofen.   Peripheral neuropathy: -Discussed Sprint PNS system as an option of pain treatment via neuromodulation. Provided following link for patient to learn more about the system: https://www.sprtherapeutics.com/. She would like to try this. Will target sciatic nerve at the popliteal fossa.  -Refilled Amitriptyline to 75mg  HS PRN.  The patient has severe pain that has been shown to be  refractory to conventional medical management with physical therapy, a number of pharmacologic options, minimally invasive procedures, and pain coping skills. Function is severely limited due to the pain and ability to perform self-care and activities of daily living is marginal. The patient has responded partially to opiate medications. At this point, having failed most conservative therapies, I consider peripheral nerve stimulation to be medically necessary to reduce his pain, increase his functionality, and in order to avoid opiate dose escalation and the concomitant risks of tolerance, physical dependence, misuse, abuse, addiction, endocrine dysfunction, respiratory depression, and death.   Anixety: Refilled Diazepam PRN. Provided refill. Has been taking twice per day and this has been helping her. Also helps with her muscle spasms. This has been well controlled. She is sometimes upset that she is only comfortably horizontally.   Tachycardia: provided higher dose of metoprolol which she was on before last visit. HR is still slightly elevated today, but BP is low. Maintain current dose. She says BP was elevated at her doctor's appointment yesterday. It fluctuates.   Lower extremity edema: advised elevation, ice, and compression garments which she is already doing.  -continue Lasix -start lymphedema therapy  Hot flashes: from Anastrazole.   Home support: Living with husband. Daughter lives 5 miles away.  Bilateral knee pain: -continue voltaren gel and blue emu oil -recommended bacopa  20 minutes spent in discussion of bilateral lower extremity edema, turmeric, lymphedema therapy, voltaren gel and blue emu oil, using bacopa, medications for which she requires refills for sleep, insomnia, and anxiety.

## 2020-09-07 DIAGNOSIS — H26492 Other secondary cataract, left eye: Secondary | ICD-10-CM | POA: Diagnosis not present

## 2020-09-11 ENCOUNTER — Ambulatory Visit: Payer: PPO | Admitting: Family

## 2020-09-13 ENCOUNTER — Other Ambulatory Visit: Payer: Self-pay | Admitting: Physical Medicine and Rehabilitation

## 2020-09-16 ENCOUNTER — Encounter: Payer: Self-pay | Admitting: Medical

## 2020-09-16 ENCOUNTER — Ambulatory Visit (INDEPENDENT_AMBULATORY_CARE_PROVIDER_SITE_OTHER): Payer: PPO | Admitting: Medical

## 2020-09-16 ENCOUNTER — Other Ambulatory Visit: Payer: Self-pay

## 2020-09-16 VITALS — BP 126/76 | HR 107 | Ht 67.0 in | Wt 280.0 lb

## 2020-09-16 DIAGNOSIS — M79606 Pain in leg, unspecified: Secondary | ICD-10-CM | POA: Diagnosis not present

## 2020-09-16 DIAGNOSIS — I10 Essential (primary) hypertension: Secondary | ICD-10-CM | POA: Diagnosis not present

## 2020-09-16 DIAGNOSIS — E782 Mixed hyperlipidemia: Secondary | ICD-10-CM | POA: Diagnosis not present

## 2020-09-16 DIAGNOSIS — I5032 Chronic diastolic (congestive) heart failure: Secondary | ICD-10-CM

## 2020-09-16 DIAGNOSIS — M7989 Other specified soft tissue disorders: Secondary | ICD-10-CM | POA: Diagnosis not present

## 2020-09-16 NOTE — Progress Notes (Signed)
Cardiology Office Note   Date:  09/23/2020   ID:  Brandi, Stephens 1946/03/03, MRN [id number]  PCP:  Girtha Rm, NP-C  Cardiologist:  [name], MD EP: None  Chief Complaint  Patient presents with  . Follow-up      History of Present Illness: Brandi Stephens is a 75 y.o. female with a PMH of chronic diastolic CHF, HTN, HLD, hypothyroidism, GERD, breast cancer s/p lumpectomy and XRT in 2017, and CKD stage 3a, who presents for evaluation of leg edema.  She was last evaluated by cardiology at an outpatient visit with Dr. Debara Pickett 05/2020 at which time she had complaints of LE edema L>R which was felt to be 2/2 venous insufficiency. She was recommended to undergo an echocardiogram which occurred 06/2020 showing EF 65-70%, moderate concentric LVH, G1DD, no RWMA, normal RV size/function, and mildly dilated ascending aorta (39mm). She was advised against increasing her lasix due to normal BNP level and high suspicions for venous insufficiency. She was recommended for compression stockings and elevation at that time. She has had several cancelled follow-up visits since that time.  She presents today with ongoing complaints of LE edema. She has continued to have worsening predominately LLE edema extending from toes to knee. She continues to have quite a bit of neuropathy in her legs as well which she states started after her first back surgery. She does have a varicose vein on her LLE. Chronic DOE is stable. She does have some orthostatic hypotension when she gets up from laying down. Orthostatic precautions reviewed. No complaints of chest pain, orthopnea, PND, palpitations, or syncope.     Past Medical History:  Diagnosis Date  . AIN (anal intraepithelial neoplasia) anal canal   . Anemia    with pregnancy  . Anxiety   . Arthritis    knees, lower back, knees  . Chronic constipation   . Chronic diastolic CHF (congestive heart failure) ([place]) 07/05/2019  . CKD (chronic kidney  disease)   . Coarse tremors    Essential  . COPD (chronic obstructive pulmonary disease) ([place])    2017 chest CT, history of  . Degenerative lumbar spinal stenosis   . Depression   . Depression 07/05/2019  . Drug dependency ([place])   . Elevated PTHrP level 05/09/2019  . ETOH abuse 07/05/2019  . GERD (gastroesophageal reflux disease)    pt denies  . Gout   . Grade I diastolic dysfunction 76/16/0737   Noted ECHO  . Hemorrhoids   . History of adenomatous polyp of colon   . History of basal cell carcinoma (BCC) excision 2015   nose  . History of sepsis 05/14/2014   post lumbar surgery  . History of shingles    x2  . Hyperlipidemia   . Hypertension   . Hypothyroidism   . Irregular heart beat    history of while undergoing radiation  . Malignant neoplasm of upper-inner quadrant of left breast in female, estrogen receptor positive [facility]) oncologist-  dr Jana Hakim--  per lov in epic no recurrence   dx 07-13-2015  Left breast invasive ductal carcinoma, Stage IA, Grade 1 (TXN0),  09-16-2015  s/p  left breast lumpectomy with sln bx's,  completed radiation 11-18-2015,  started antiestrogen therapy 12-14-2015  . Neuropathy    Left foot and back of left leg  . Obesity (BMI 30-39.9) 07/05/2019  . Personal history of radiation therapy    completed 11-18-2015  left breast  . Pneumonia   . PONV (  postoperative nausea and vomiting)   . Prediabetes    diet controlled, no med  . Restless leg syndrome   . Seasonal allergies   . Seizure ([place] [address]) 05/2015   due to sepsis, only one time episode  . Tremor   . Wears partial dentures    lower     Past Surgical History:  Procedure Laterality Date  . ABDOMINAL HYSTERECTOMY  1985  . BASAL CELL CARCINOMA EXCISION  10/15   nose  . BREAST BIOPSY Right 08/24/2015  . BREAST BIOPSY Right 07/13/2015  . BREAST BIOPSY Left 07/13/2015  . BREAST EXCISIONAL BIOPSY Left 1972  . BREAST LUMPECTOMY Left   . BREAST LUMPECTOMY WITH RADIOACTIVE SEED AND SENTINEL LYMPH  NODE BIOPSY Left 09/16/2015   Procedure: BREAST LUMPECTOMY WITH RADIOACTIVE SEED AND SENTINEL LYMPH NODE BIOPSY;  Surgeon: Stark Klein, MD;  Location: [facility];  Service: General;  Laterality: Left;  . BREAST REDUCTION SURGERY Bilateral 1998  . CATARACT EXTRACTION W/ INTRAOCULAR LENS IMPLANT Left 2016  . [place]   hallo  . COLONOSCOPY  01/2013   polyps  . EYE SURGERY Left 2016  . HAND SURGERY Left 02-19-2002   dr Fredna Dow  @[facility]    repair collateral ligament/ MPJ left thumb  . HARDWARE REMOVAL N/A 07/24/2019   Procedure: REVISION OF HARDWARE Lumbar two - Sacral one. Extention of Fusion to Lumbar one;  Surgeon: [name], MD;  Location: [facility];  Service: Neurosurgery;  Laterality: N/A;  . HEMORRHOID SURGERY  2008  . HIGH RESOLUTION ANOSCOPY N/A 02/28/2018   Procedure: HIGH RESOLUTION ANOSCOPY WITH BIOPSY;  Surgeon: Leighton Ruff, MD;  Location: [facility];  Service: General;  Laterality: N/A;  . KNEE ARTHROSCOPY Right 2002  . LUMBAR SPINE SURGERY  03-20-2015   dr Kathyrn Sheriff   fusion L4-5, L5-S1  . MOUTH SURGERY  07/14/2018   2 infected teeth removed  . MULTIPLE TOOTH EXTRACTIONS     with bone grafting lower bottom right and left  . REDUCTION MAMMAPLASTY Bilateral   . RIGHT COLECTOMY  09-10-2003   dr Margot Chimes @[facility]    multiple colon polyps (per path tubular adenoma's, hyperplastic , benign appendix, benign two lymph nodes  . ROTATOR CUFF REPAIR Left 04/2016  . TONSILLECTOMY  1969  . TUBAL LIGATION Bilateral 1982     Current Outpatient Medications  Medication Sig Dispense Refill  . albuterol (VENTOLIN HFA) 108 (90 Base) MCG/ACT inhaler INHALE 2 PUFFS BY MOUTH EVERY 4 TO 6 HOURS AS NEEDED 42.5 g 0  . allopurinol (ZYLOPRIM) 300 MG tablet TAKE 1 TABLET(300 MG) BY MOUTH DAILY 90 tablet 0  . amitriptyline (ELAVIL) 75 MG tablet TAKE 1 TABLET(75 MG) BY MOUTH AT BEDTIME 90 tablet 3  . anastrozole (ARIMIDEX) 1 MG tablet TAKE 1 TABLET(1 MG)  BY MOUTH DAILY 90 tablet 3  . atorvastatin (LIPITOR) 20 MG tablet TAKE 1 TABLET BY MOUTH EVERY DAY 90 tablet 1  . betamethasone dipropionate 0.05 % cream Apply topically 2 (two) times daily.    . Biotin 3 MG TABS Take by mouth.    . cephALEXin (KEFLEX) 500 MG capsule TAKE 1 CAPSULE(500 MG) BY MOUTH THREE TIMES DAILY 90 capsule 1  . Cholecalciferol (D3 ADULT PO) Take by mouth.    . diazepam (VALIUM) 5 MG tablet Take 1 tablet (5 mg total) by mouth daily as needed for anxiety. 30 tablet 0  . diclofenac Sodium (VOLTAREN) 1 % GEL Apply 4 g topically 4 (four) times daily.    Marland Kitchen  docusate sodium (COLACE) 100 MG capsule Take 1 capsule (100 mg total) by mouth 3 (three) times daily. (Patient taking differently: Take 100 mg by mouth 2 (two) times daily.) 10 capsule 0  . DULoxetine (CYMBALTA) 60 MG capsule TAKE 1 CAPSULE(60 MG) BY MOUTH DAILY 90 capsule 1  . Fluticasone-Salmeterol (ADVAIR) 250-50 MCG/DOSE AEPB Inhale 1 puff into the lungs every 12 (twelve) hours. 60 each 11  . furosemide (LASIX) 40 MG tablet Take 1 tablet (40 mg total) by mouth daily. 90 tablet 3  . HYDROcodone-acetaminophen (NORCO/VICODIN) 5-325 MG tablet Take 1 tablet by mouth daily as needed for moderate pain. 30 tablet 0  . levothyroxine (SYNTHROID) 75 MCG tablet TAKE 1 TABLET(75 MCG) BY MOUTH DAILY BEFORE BREAKFAST 90 tablet 1  . lidocaine (LIDODERM) 5 % UNWRAP AND APPLY 2 PATCHES TO SKIN DAILY, REMOVE AND DISCARD PATCH WITHIN 12 HOURS OR AS DIRECTED BY DOCTOR 180 patch 1  . magnesium gluconate (MAGONATE) 500 MG tablet Take 2 tablets (1,000 mg total) by mouth at bedtime. 60 tablet 0  . metoprolol succinate (TOPROL-XL) 25 MG 24 hr tablet Take 1 tablet (25 mg total) by mouth daily. 90 tablet 3  . Omega-3 Fatty Acids (OMEGA-3 FISH OIL PO) Take by mouth.    . polyethylene glycol powder (GLYCOLAX/MIRALAX) 17 GM/SCOOP powder Take 17 g by mouth daily as needed for moderate constipation.     . primidone (MYSOLINE) 50 MG tablet TAKE 1 TABLET BY  MOUTH TWICE DAILY 180 tablet 3  . Probiotic Product (PROBIOTIC DAILY PO) Take by mouth.    . QUEtiapine (SEROQUEL) 25 MG tablet TAKE 1 TABLET(25 MG) BY MOUTH AT BEDTIME 90 tablet 1  . rOPINIRole (REQUIP) 0.5 MG tablet TAKE 1 AND 1/2 TABLETS(0.75 MG) BY MOUTH AT BEDTIME 140 tablet 1  . vitamin B-12 (CYANOCOBALAMIN) 1000 MCG tablet Take 1 tablet (1,000 mcg total) by mouth daily. 30 tablet 0   No current facility-administered medications for this visit.    Allergies:   Ampicillin, Gabapentin, Penicillins, Codeine, and Other    Social History:  The patient  reports that she quit smoking about 2 years ago. Her smoking use included cigarettes. She has a 17.50 pack-year smoking history. She has never used smokeless tobacco. She reports current alcohol use. She reports previous drug use.   Family History:  The patient's family history includes Atrial fibrillation in her mother; Brain cancer in her maternal aunt; Breast cancer in her maternal aunt; Lung cancer in her maternal aunt and maternal uncle; Prostate cancer in her maternal grandfather; Stomach cancer in her maternal grandmother; Stroke in her paternal grandmother; Tuberculosis in her paternal grandfather; Ulcers in her father.    ROS:  Please see the history of present illness.   Otherwise, review of systems are positive for none.   All other systems are reviewed and negative.    PHYSICAL EXAM: VS:  BP 126/76   Pulse (!) 107   Ht 5\' 7"  (1.702 m)   Wt 280 lb (127 kg)   BMI 43.85 kg/m  , BMI Body mass index is 43.85 kg/m. GEN: Well nourished, well developed, in no acute distress HEENT: sclera anicteric Neck: no JVD, carotid bruits, or masses Cardiac: RRR; no murmurs, rubs, or gallops, 2+ LLE edema and 1+ RLE edema  Respiratory:  clear to auscultation bilaterally, normal work of breathing GI: soft, obese, nontender, nondistended, + BS MS: no deformity or atrophy Skin: warm and dry, no rash Neuro:  Strength and sensation are  intact Psych: euthymic  mood, full affect   EKG:  EKG is ordered today. The ekg ordered today demonstrates sinus tachycardia with rate 108 bpm, non-specific ST-T wave abnormalities, no significant STE   Recent Labs: 06/04/2020: Hemoglobin 13.0; Platelets 226; TSH 3.530 06/15/2020: ALT 22; BNP 75.4; BUN 30; Creatinine, Ser 1.21; Potassium 4.8; Sodium 140    Lipid Panel    Component Value Date/Time   CHOL 208 (H) 01/31/2020 1040   TRIG 362 (H) 01/31/2020 1040   HDL 53 01/31/2020 1040   CHOLHDL 3.9 01/31/2020 1040   CHOLHDL 4.2 07/08/2016 1042   VLDL 52 (H) 07/08/2016 1042   LDLCALC 95 01/31/2020 1040      Wt Readings from Last 3 Encounters:  09/16/20 280 lb (127 kg)  08/07/20 274 lb 3.2 oz (124.4 kg)  08/04/20 281 lb (127.5 kg)      Other studies Reviewed: Additional studies/ records that were reviewed today include:   Echocardiogram 06/2020: 1. Left ventricular ejection fraction, by estimation, is 65 to 70%. The  left ventricle has normal function. The left ventricle has no regional  wall motion abnormalities. There is moderate concentric left ventricular  hypertrophy. Left ventricular  diastolic parameters are consistent with Grade I diastolic dysfunction  (impaired relaxation).  2. Right ventricular systolic function is normal. The right ventricular  size is normal.  3. The mitral valve is grossly normal. No evidence of mitral valve  regurgitation.  4. The aortic valve was not well visualized. Aortic valve regurgitation  is not visualized.  5. Aortic dilatation noted. There is mild dilatation of the ascending  aorta, measuring 39 mm.   Comparison(s): 01/22/16 EF 65-70%.     ASSESSMENT AND PLAN:   1. LE edema, predominately left leg: she numerous venous studies to r/o DVT which have been negative. Unclear etiology though possible venous insufficiency is contributing. We discussed maintaining a low salt diet, limiting fluids, and wearing compression  stockings - Will check a venous reflux study to evaluate for venous insufficiency and consider refer to Vein specialist for management options.   2. Chronic diastolic CHF: Overall stable symptoms over the past several months. BNP historically normal. Last echo 06/2020 showed EF 65-70%, G1DD.  - Continue lasix 40mg  daily  3. HTN: BP 126/76 today - Continue metoprolol succinate and lasix  4. HLD: LDL 95 01/2020 - Continue atorvastatin and omega-3   Current medicines are reviewed at length with the patient today.  The patient does not have concerns regarding medicines.  The following changes have been made:  As above  Labs/ tests ordered today include:   Orders Placed This Encounter  Procedures  . VAS [place] LOWER EXTREMITY VENOUS REFLUX     Disposition:   FU with Dr. Debara Pickett or me in 3 months  Signed, Abigail Butts, PA-C  09/23/2020 2:49 PM

## 2020-09-16 NOTE — Patient Instructions (Signed)
Medication Instructions:  Continue current medication  *If you need a refill on your cardiac medications before your next appointment, please call your pharmacy*   Lab Work: None Ordered   Testing/Procedures: Your physician has requested that you have a lower extremity venous duplex. This test is an ultrasound of the veins in the legs or arms. It looks at venous blood flow that carries blood from the heart to the legs or arms. Allow one hour for a Lower Venous exam. Allow thirty minutes for an Upper Venous exam. There are no restrictions or special instructions.   Follow-Up: At [facility], you and your health needs are our priority.  As part of our continuing mission to provide you with exceptional heart care, we have created designated Provider Care Teams.  These Care Teams include your primary Cardiologist (physician) and Advanced Practice Providers (APPs -  Physician Assistants and Nurse Practitioners) who all work together to provide you with the care you need, when you need it.  We recommend signing up for the patient portal called "MyChart".  Sign up information is provided on this After Visit Summary.  MyChart is used to connect with patients for Virtual Visits (Telemedicine).  Patients are able to view lab/test results, encounter notes, upcoming appointments, etc.  Non-urgent messages can be sent to your provider as well.   To learn more about what you can do with MyChart, go to [email or web address].    Your next appointment:   3 Months  The format for your next appointment:   In Person  Provider:   You may see [name], MD or one of the following Advanced Practice Providers on your designated Care Team:    Almyra Deforest, PA-C  Fabian Sharp, PA-C or   Roby Lofts, [place]

## 2020-09-18 DIAGNOSIS — I5030 Unspecified diastolic (congestive) heart failure: Secondary | ICD-10-CM | POA: Diagnosis not present

## 2020-09-18 DIAGNOSIS — Z9889 Other specified postprocedural states: Secondary | ICD-10-CM | POA: Diagnosis not present

## 2020-09-18 DIAGNOSIS — E261 Secondary hyperaldosteronism: Secondary | ICD-10-CM | POA: Diagnosis not present

## 2020-09-18 DIAGNOSIS — Z6841 Body Mass Index (BMI) 40.0 and over, adult: Secondary | ICD-10-CM | POA: Diagnosis not present

## 2020-09-18 DIAGNOSIS — N183 Chronic kidney disease, stage 3 unspecified: Secondary | ICD-10-CM | POA: Diagnosis not present

## 2020-09-21 ENCOUNTER — Other Ambulatory Visit: Payer: Self-pay

## 2020-09-21 ENCOUNTER — Ambulatory Visit (HOSPITAL_COMMUNITY)
Admission: RE | Admit: 2020-09-21 | Discharge: 2020-09-21 | Disposition: A | Discharge status: Home / Self Care | Payer: PPO | Source: Ambulatory Visit | Attending: Cardiology | Admitting: Cardiology

## 2020-09-21 DIAGNOSIS — M79605 Pain in left leg: Secondary | ICD-10-CM | POA: Diagnosis not present

## 2020-09-21 DIAGNOSIS — M7989 Other specified soft tissue disorders: Secondary | ICD-10-CM | POA: Insufficient documentation

## 2020-09-21 DIAGNOSIS — M79606 Pain in leg, unspecified: Secondary | ICD-10-CM | POA: Insufficient documentation

## 2020-09-23 DIAGNOSIS — R600 Localized edema: Secondary | ICD-10-CM

## 2020-09-24 ENCOUNTER — Other Ambulatory Visit: Payer: Self-pay | Admitting: Internal Medicine

## 2020-09-24 ENCOUNTER — Other Ambulatory Visit: Payer: Self-pay

## 2020-09-24 DIAGNOSIS — M4626 Osteomyelitis of vertebra, lumbar region: Secondary | ICD-10-CM

## 2020-09-24 MED ORDER — CEPHALEXIN 500 MG PO CAPS: ORAL_CAPSULE | ORAL | 1 refills | Status: DC

## 2020-09-24 NOTE — Telephone Encounter (Signed)
Referral entered  

## 2020-09-28 ENCOUNTER — Other Ambulatory Visit: Payer: Self-pay

## 2020-09-28 DIAGNOSIS — R600 Localized edema: Secondary | ICD-10-CM

## 2020-09-30 NOTE — Addendum Note (Signed)
Addended by: Jacqulynn Cadet on: 09/30/2020 04:13 PM   Modules accepted: Orders

## 2020-10-09 ENCOUNTER — Other Ambulatory Visit: Payer: Self-pay | Admitting: Family Medicine

## 2020-10-14 ENCOUNTER — Encounter: Payer: Self-pay | Admitting: Family Medicine

## 2020-10-14 ENCOUNTER — Telehealth (INDEPENDENT_AMBULATORY_CARE_PROVIDER_SITE_OTHER): Payer: PPO | Admitting: Family Medicine

## 2020-10-14 ENCOUNTER — Other Ambulatory Visit: Payer: Self-pay

## 2020-10-14 DIAGNOSIS — M545 Low back pain, unspecified: Secondary | ICD-10-CM | POA: Diagnosis not present

## 2020-10-14 NOTE — Progress Notes (Signed)
Start time: 1:33 End time: 1:49  Virtual Visit via Video Note  I connected with Brandi Stephens on 10/14/20 by a video enabled telemedicine application and verified that I am speaking with the correct person using two identifiers.  Location: Patient: home Provider: office   I discussed the limitations of evaluation and management by telemedicine and the availability of in person appointments. The patient expressed understanding and agreed to proceed.  History of Present Illness:  Chief Complaint  Patient presents with   Fall    Fall last night getting out of bed going to bathroom. Lamp fell on head but not having any headaches. Fell on tailbone and having severe back pain as well. Table fell on her as well. Having trouble moving so couldn't make it in for an appointmenmt   She tripped on the way to her bathroom last night.  She is in a lot of pain in her mid-back (above waist to below waist).  She feels okay if she is still, but hurts to move.  She is asking for pain medications to get her through the next 2 days. She states that Loletha Carrow has periodically done this for her. PDMP reviewed--no rx's from [facility] for pain meds in the last year. 03/2020 #30 hydrocodone from neurosurgeon  Patient reports she has had neck and multiple back surgeries, last 07/2019.  She has swelling in her LLE, can't get shoe on it. Had appt scheduled for tomorrow to see [facility] to help with the chronic swelling. She reports she can't get into her car to go, cancelled her appt, r/s to 7/21.   PMH, PSH, [place] reviewed She has h/o drug dependency and alcohol abuse noted in her chart.  Outpatient Encounter Medications as of 10/14/2020  Medication Sig   albuterol (VENTOLIN HFA) 108 (90 Base) MCG/ACT inhaler INHALE 2 PUFFS BY MOUTH EVERY 4 TO 6 HOURS AS NEEDED   allopurinol (ZYLOPRIM) 300 MG tablet TAKE 1 TABLET(300 MG) BY MOUTH DAILY   amitriptyline (ELAVIL) 75 MG tablet TAKE 1 TABLET(75 MG) BY  MOUTH AT BEDTIME   anastrozole (ARIMIDEX) 1 MG tablet TAKE 1 TABLET(1 MG) BY MOUTH DAILY   atorvastatin (LIPITOR) 20 MG tablet TAKE 1 TABLET BY MOUTH EVERY DAY   betamethasone dipropionate 0.05 % cream Apply topically 2 (two) times daily.   Biotin 3 MG TABS Take by mouth.   cephALEXin (KEFLEX) 500 MG capsule TAKE 1 CAPSULE(500 MG) BY MOUTH THREE TIMES DAILY   Cholecalciferol (D3 ADULT PO) Take by mouth.   diazepam (VALIUM) 5 MG tablet Take 1 tablet (5 mg total) by mouth daily as needed for anxiety.   docusate sodium (COLACE) 100 MG capsule Take 1 capsule (100 mg total) by mouth 3 (three) times daily. (Patient taking differently: Take 100 mg by mouth 2 (two) times daily.)   DULoxetine (CYMBALTA) 60 MG capsule TAKE 1 CAPSULE(60 MG) BY MOUTH DAILY   fluorometholone (FML) 0.1 % ophthalmic suspension SMARTSIG:In Eye(s)   Fluticasone-Salmeterol (ADVAIR) 250-50 MCG/DOSE AEPB Inhale 1 puff into the lungs every 12 (twelve) hours.   furosemide (LASIX) 40 MG tablet Take 1 tablet (40 mg total) by mouth daily.   levothyroxine (SYNTHROID) 75 MCG tablet TAKE 1 TABLET(75 MCG) BY MOUTH DAILY BEFORE BREAKFAST   magnesium gluconate (MAGONATE) 500 MG tablet Take 2 tablets (1,000 mg total) by mouth at bedtime.   metoprolol succinate (TOPROL-XL) 25 MG 24 hr tablet Take 1 tablet (25 mg total) by mouth daily.   Omega-3 Fatty Acids (OMEGA-3  FISH OIL PO) Take by mouth.   polyethylene glycol powder (GLYCOLAX/MIRALAX) 17 GM/SCOOP powder Take 17 g by mouth daily as needed for moderate constipation.    primidone (MYSOLINE) 50 MG tablet TAKE 1 TABLET BY MOUTH TWICE DAILY   Probiotic Product (PROBIOTIC DAILY PO) Take by mouth.   QUEtiapine (SEROQUEL) 25 MG tablet TAKE 1 TABLET(25 MG) BY MOUTH AT BEDTIME   rOPINIRole (REQUIP) 0.5 MG tablet TAKE 1 AND 1/2 TABLETS(0.75 MG) BY MOUTH AT BEDTIME   vitamin B-12 (CYANOCOBALAMIN) 1000 MCG tablet Take 1 tablet (1,000 mcg total) by mouth daily.   diclofenac Sodium (VOLTAREN) 1 % GEL  Apply 4 g topically 4 (four) times daily. (Patient not taking: Reported on 10/14/2020)   lidocaine (LIDODERM) 5 % UNWRAP AND APPLY 2 PATCHES TO SKIN DAILY, REMOVE AND DISCARD PATCH WITHIN 12 HOURS OR AS DIRECTED BY DOCTOR (Patient not taking: Reported on 10/14/2020)   [DISCONTINUED] HYDROcodone-acetaminophen (NORCO/VICODIN) 5-325 MG tablet Take 1 tablet by mouth daily as needed for moderate pain. (Patient not taking: Reported on 10/14/2020)   No facility-administered encounter medications on file as of 10/14/2020.   On Keflex chronically for h/o spinal infection  Allergies  Allergen Reactions   Ampicillin Hives and Other (See Comments)    Severe reaction in February 2017 1.5 month to have hives to go away   Gabapentin Swelling    UNSPECIFIED REACTION    Penicillins Hives and Other (See Comments)    Severe reaction in February 2017 1.5 month to have hives to go away Has patient had a PCN reaction causing immediate rash, facial/tongue/throat swelling, SOB or lightheadedness with hypotension: #  #  #  YES  #  #  #  Has patient had a PCN reaction causing severe rash involving mucus membranes or skin necrosis: No Has patient had a PCN reaction that required hospitalization No Has patient had a PCN reaction occurring within the last 10 years: No    Codeine Nausea And Vomiting   Other Itching    UNSPECIFIED Analgesics   ROS: no fever, chills, URI symptoms.  +back pain per HPI. She has some chronic tingling, denies any new tingling, weakness, bowel/bladder dysfunction, just pain.    Observations/Objective:  There were no vitals taken for this visit.  Patient is alert and oriented. She is speaking comfortably. Exam is limited by virtual nature of the visit   Assessment and Plan:  Low back pain, unspecified back pain laterality, unspecified chronicity, unspecified whether sciatica present - acute on chronic, related to fall. Given her hx, and prior rx's for pain meds from neurosurgeon and  not PCP, I'm not comfortable rx'ing them today as requested  Chart reviewed--she stated Vickie had told her not to take Aleve. Review of chart shows only slight elevation of Cr, fine to take Aleve short-term.  Lab Results  Component Value Date   CREATININE 1.21 (H) 06/15/2020   2 Aleve twice daily with food, along with extra strength tylenol Ice and/or heat.  F/u with neurosurgeon if not improving, or if any new/worsening neurologic symptoms from baseline   Follow Up Instructions:    I discussed the assessment and treatment plan with the patient. The patient was provided an opportunity to ask questions and all were answered. The patient agreed with the plan and demonstrated an understanding of the instructions.   The patient was advised to call back or seek an in-person evaluation if the symptoms worsen or if the condition fails to improve as anticipated.  I spent  21 minutes dedicated to the care of this patient, including pre-visit review of records, face to face time, post-visit ordering of testing and documentation.    Vikki Ports, MD

## 2020-10-14 NOTE — Patient Instructions (Signed)
Use Aleve 2 tablets twice daily with food, along with Extra Strength Tylenol (safe to use together) to treat your acute back pain related to your fall. You should also continue heat vs ice vs alternating them (whichever feels best), topical lidocaine.  You should follow up with your neurosurgeon if pain persists/worsens, especially if there are any new neurologic symptoms (increased weakness, bowel or bladder issues, etc).

## 2020-10-15 ENCOUNTER — Encounter: Payer: PPO | Admitting: Vascular Surgery

## 2020-10-23 ENCOUNTER — Telehealth: Payer: Self-pay | Admitting: *Deleted

## 2020-10-23 NOTE — Telephone Encounter (Signed)
This RN spoke with pt per her VM stating ongoing concern relating to increasing edema.  She states swelling has increased in her lower extremities and now she is having swelling in bilateral wrist and hands.  She states she cannot walk very well anymore and has had 2 falls in the past couple of weeks.  She states she has been seen by " all the specialist and they cannot tell me why I am swelling like this "  She is inquiring with Dr Jana Hakim about the above and possible concern with her history of breast cancer.  This RN informed above symptoms are not known to be related to breast cancer but her concerns will be forwarded to MD for further review and input.  Pt verbalized appreciation of call.

## 2020-10-26 ENCOUNTER — Other Ambulatory Visit: Payer: Self-pay | Admitting: Oncology

## 2020-10-31 ENCOUNTER — Other Ambulatory Visit: Payer: Self-pay | Admitting: Physical Medicine and Rehabilitation

## 2020-11-02 ENCOUNTER — Encounter: Payer: Self-pay | Admitting: *Deleted

## 2020-11-02 ENCOUNTER — Other Ambulatory Visit: Payer: Self-pay | Admitting: *Deleted

## 2020-11-02 MED ORDER — METOPROLOL SUCCINATE ER 25 MG PO TB24: 25.0000 mg | ORAL_TABLET | Freq: Every day | ORAL | 3 refills | Status: DC

## 2020-11-03 ENCOUNTER — Other Ambulatory Visit: Payer: Self-pay

## 2020-11-03 ENCOUNTER — Ambulatory Visit: Payer: PPO | Admitting: Internal Medicine

## 2020-11-03 ENCOUNTER — Encounter: Payer: Self-pay | Admitting: Internal Medicine

## 2020-11-03 DIAGNOSIS — M4626 Osteomyelitis of vertebra, lumbar region: Secondary | ICD-10-CM

## 2020-11-03 MED ORDER — CEPHALEXIN 500 MG PO CAPS: ORAL_CAPSULE | ORAL | 3 refills | Status: DC

## 2020-11-03 NOTE — Progress Notes (Signed)
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[place]) 08/02/2019   Post op infection 07/11/2019   Muscle spasm    Hypokalemia    Postoperative pain    Acute blood loss anemia    Labile blood pressure    Tobacco abuse    Hypoalbuminemia due to protein-calorie malnutrition (HCC)    Anxiety 07/05/2019   Depression 07/05/2019   Chronic diastolic CHF (congestive heart failure) ([place]) 07/05/2019   Obesity (BMI 30-39.9) 07/05/2019   ETOH abuse 07/05/2019   Lumbar radiculopathy 06/28/2019   Spinal stenosis of lumbar region 06/28/2019   Acute on chronic anemia    Tremors of nervous system    AKI (acute kidney injury) ([place])    Chronic pain syndrome    Acute renal failure superimposed on stage 3 chronic kidney disease ([place]) 06/25/2019   Weakness of left lower extremity    Abdominal pain 06/24/2019   Nausea 06/24/2019   Abnormal urine odor 06/24/2019   History of sepsis 06/24/2019   Recent major surgery 06/24/2019   History of diverticulosis 06/24/2019   Elevated PTHrP level 05/09/2019   Paresthesia 02/12/2019   Low back pain 02/12/2019   Neuropathy 12/20/2018   Lumbar spinal stenosis 06/29/2018   Preoperative clearance 06/04/2018   Abnormal CT of the chest 06/04/2018   Smoker 05/17/2018   Hypothyroidism 11/30/2017   Thoracic aortic atherosclerosis ([place]) 09/14/2016   Benign essential tremor 09/09/2016   RLS (restless legs syndrome) 09/09/2016    H/O adenomatous polyp of colon 07/06/2016   PVC's (premature ventricular contractions) 03/29/2016   Preoperative cardiovascular examination 03/29/2016   Grade I diastolic dysfunction 09/32/6712   Palpitations 01/11/2016   Shortness of breath 01/11/2016   Former cigarette smoker 01/11/2016   Leg edema 01/11/2016   Chronic cough 10/28/2015   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   Malignant neoplasm of upper-inner quadrant of left breast in female, estrogen receptor positive ([place]) 07/14/2015   Prediabetes 07/08/2015   CKD (chronic kidney disease), stage III 07/08/2015   Dependent edema 06/08/2015   Sleep disturbance 06/08/2015   Mixed hyperlipidemia 06/08/2015   Generalized anxiety disorder 06/08/2015   Essential hypertension 06/08/2015   COPD (chronic obstructive pulmonary disease) ([place]) 06/08/2015   Gout 06/08/2015   Macrocytic anemia    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[place])    Sepsis ([place]) 05/15/2015   Lumbar spondylosis 03/20/2015    Patient's Medications  New Prescriptions   No medications on file  Previous Medications   ALBUTEROL (VENTOLIN HFA) 108 (90 BASE) MCG/ACT INHALER    INHALE 2 PUFFS BY MOUTH EVERY 4 TO 6 HOURS AS NEEDED   ALLOPURINOL (ZYLOPRIM) 300 MG TABLET    TAKE 1 TABLET(300 MG) BY MOUTH DAILY   AMITRIPTYLINE (ELAVIL) 75 MG TABLET    TAKE 1 TABLET(75 MG) BY MOUTH AT BEDTIME   ANASTROZOLE (ARIMIDEX) 1 MG TABLET    TAKE 1 TABLET(1 MG) BY MOUTH DAILY  ATORVASTATIN (LIPITOR) 20 MG TABLET    TAKE 1 TABLET BY MOUTH EVERY DAY   BETAMETHASONE DIPROPIONATE 0.05 % CREAM    Apply topically 2 (two) times daily.   BIOTIN 3 MG TABS    Take by mouth.   CHOLECALCIFEROL (D3 ADULT PO)    Take by mouth.   DIAZEPAM (VALIUM) 5 MG TABLET    Take 1 tablet (5 mg total) by mouth daily as needed for anxiety.   DICLOFENAC SODIUM (VOLTAREN) 1 % GEL    Apply 4 g topically 4 (four) times daily.   DOCUSATE SODIUM (COLACE) 100 MG CAPSULE    Take 1 capsule (100 mg total) by mouth 3 (three)  times daily.   DULOXETINE (CYMBALTA) 60 MG CAPSULE    TAKE 1 CAPSULE(60 MG) BY MOUTH DAILY   FLUOROMETHOLONE (FML) 0.1 % OPHTHALMIC SUSPENSION    SMARTSIG:In Eye(s)   FLUTICASONE-SALMETEROL (ADVAIR) 250-50 MCG/DOSE AEPB    Inhale 1 puff into the lungs every 12 (twelve) hours.   FUROSEMIDE (LASIX) 40 MG TABLET    Take 1 tablet (40 mg total) by mouth daily.   LEVOTHYROXINE (SYNTHROID) 75 MCG TABLET    TAKE 1 TABLET(75 MCG) BY MOUTH DAILY BEFORE BREAKFAST   LIDOCAINE (LIDODERM) 5 %    UNWRAP AND APPLY 2 PATCHES TO SKIN DAILY, REMOVE AND DISCARD PATCH WITHIN 12 HOURS OR AS DIRECTED BY DOCTOR   MAGNESIUM GLUCONATE (MAGONATE) 500 MG TABLET    Take 2 tablets (1,000 mg total) by mouth at bedtime.   METOPROLOL SUCCINATE (TOPROL-XL) 25 MG 24 HR TABLET    Take 1 tablet (25 mg total) by mouth daily.   OMEGA-3 FATTY ACIDS (OMEGA-3 FISH OIL PO)    Take by mouth.   POLYETHYLENE GLYCOL POWDER (GLYCOLAX/MIRALAX) 17 GM/SCOOP POWDER    Take 17 g by mouth daily as needed for moderate constipation.    PRIMIDONE (MYSOLINE) 50 MG TABLET    TAKE 1 TABLET BY MOUTH TWICE DAILY   PROBIOTIC PRODUCT (PROBIOTIC DAILY PO)    Take by mouth.   QUETIAPINE (SEROQUEL) 25 MG TABLET    TAKE 1 TABLET(25 MG) BY MOUTH AT BEDTIME   ROPINIROLE (REQUIP) 0.5 MG TABLET    TAKE 1 AND 1/2 TABLETS(0.75 MG) BY MOUTH AT BEDTIME   VITAMIN B-12 (CYANOCOBALAMIN) 1000 MCG TABLET    Take 1 tablet (1,000 mcg total) by mouth daily.  Modified Medications   Modified Medication Previous Medication   CEPHALEXIN (KEFLEX) 500 MG CAPSULE cephALEXin (KEFLEX) 500 MG capsule      TAKE 1 CAPSULE(500 MG) BY MOUTH THREE TIMES DAILY    TAKE 1 CAPSULE(500 MG) BY MOUTH THREE TIMES DAILY  Discontinued Medications   No medications on file    Subjective: Brandi Stephens is in for her routine follow-up visit.  She developed postop lumbar MSSA infection in March 2021.  He was treated with 6 weeks of IV cefazolin before converting to chronic suppressive oral cephalexin.  She is  not having any problems tolerating cephalexin other than occasional diarrhea 1-2 times per month..  There has been no change in her chronic nausea.  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  She cannot put on compressive stockings herself and she has no one at home to put them on for her.  She is having more left knee pain.  Her chronic back pain is unchanged.  Review of Systems: Review of Systems  Constitutional:  Negative for chills, diaphoresis and fever.  Gastrointestinal:  Positive  for diarrhea and nausea. Negative for abdominal pain and vomiting.  Musculoskeletal:  Positive for back pain and joint pain.   Past Medical History:  Diagnosis Date   AIN (anal intraepithelial neoplasia) anal canal    Anemia    with pregnancy   Anxiety    Arthritis    knees, lower back, knees   Chronic constipation    Chronic diastolic CHF (congestive heart failure) ([place]) 07/05/2019   CKD (chronic kidney disease)    Coarse tremors    Essential   COPD (chronic obstructive pulmonary disease) ([place])    2017 chest CT, history of   Degenerative lumbar spinal stenosis    Depression    Depression 07/05/2019   Drug dependency ([place])    Elevated PTHrP level 05/09/2019   ETOH abuse 07/05/2019   GERD (gastroesophageal reflux disease)    pt denies   Gout    Grade I diastolic dysfunction 60/73/7106   Noted ECHO   Hemorrhoids    History of adenomatous polyp of colon    History of basal cell carcinoma (BCC) excision 2015   nose   History of sepsis 05/14/2014   post lumbar surgery   History of shingles    x2   Hyperlipidemia    Hypertension    Hypothyroidism    Irregular heart beat    history of while undergoing radiation   Malignant neoplasm of upper-inner quadrant of left breast in female, estrogen receptor positive [facility]) oncologist-  dr Jana Hakim--  per lov in epic no recurrence   dx 07-13-2015  Left breast invasive ductal carcinoma, Stage IA, Grade 1 (TXN0),  09-16-2015   s/p  left breast lumpectomy with sln bx's,  completed radiation 11-18-2015,  started antiestrogen therapy 12-14-2015   Neuropathy    Left foot and back of left leg   Obesity (BMI 30-39.9) 07/05/2019   Personal history of radiation therapy    completed 11-18-2015  left breast   Pneumonia    PONV (postoperative nausea and vomiting)    Prediabetes    diet controlled, no med   Restless leg syndrome    Seasonal allergies    Seizure ([place]) 05/2015   due to sepsis, only one time episode   Tremor    Wears partial dentures    lower     Social History   Tobacco Use   Smoking status: Former    Packs/day: 0.50    Years: 35.00    Pack years: 17.50    Types: Cigarettes    Quit date: 05/22/2018    Years since quitting: 2.4   Smokeless tobacco: Never  Vaping Use   Vaping Use: Never used  Substance Use Topics   Alcohol use: Yes    Comment: 1 glass of wine a night   Drug use: Not Currently    Comment: CBD tincture    Family History  Problem Relation Age of Onset   Atrial fibrillation Mother    Ulcers Father    Breast cancer Maternal Aunt    Lung cancer Maternal Uncle    Stomach cancer Maternal Grandmother    Lung cancer Maternal Aunt    Brain cancer Maternal Aunt    Prostate cancer Maternal Grandfather    Stroke Paternal Grandmother    Tuberculosis Paternal Grandfather    Colon cancer Neg Hx    Esophageal cancer Neg Hx    Rectal cancer Neg Hx     Allergies  Allergen Reactions   Ampicillin Hives and Other (See Comments)  Severe reaction in February 2017 1.5 month to have hives to go away   Gabapentin Swelling    UNSPECIFIED REACTION    Penicillins Hives and Other (See Comments)    Severe reaction in February 2017 1.5 month to have hives to go away Has patient had a PCN reaction causing immediate rash, facial/tongue/throat swelling, SOB or lightheadedness with hypotension: #  #  #  YES  #  #  #  Has patient had a PCN reaction causing severe rash involving mucus membranes or  skin necrosis: No Has patient had a PCN reaction that required hospitalization No Has patient had a PCN reaction occurring within the last 10 years: No    Codeine Nausea And Vomiting   Other Itching    UNSPECIFIED Analgesics    Objective: Vitals:   11/03/20 1407  BP: 139/79  Pulse: (!) 130  Temp: 97.8 F (36.6 C)  TempSrc: Oral  Weight: 286 lb (129.7 kg)   Body mass index is 44.79 kg/m.  Physical Exam Constitutional:      Comments: She is frustrated by her multiple medical problems but otherwise in good spirits.  She walks with the aid of her walker.  Cardiovascular:     Rate and Rhythm: Normal rate.  Pulmonary:     Effort: Pulmonary effort is normal.  Musculoskeletal:     Right lower leg: Edema present.     Left lower leg: Edema present.     Comments: Her incisions are fully healed.  Psychiatric:        Mood and Affect: Mood normal.    Lab Results Sed Rate (mm/h)  Date Value  08/04/2020 28  06/10/2020 22  02/18/2020 22   CRP (mg/L)  Date Value  08/04/2020 18.5 (H)  06/10/2020 21.6 (H)  02/18/2020 15.6 (H)  02/12/2019 <1      Problem List Items Addressed This Visit       High   Acute osteomyelitis of lumbar spine ([place])    She is clinically improved.  It is still a question however whether or not her flexion is cured or simply being suppressed.  She would like to continue cephalexin and get repeat lab work today.  She will follow-up in 3 months.       Relevant Medications   cephALEXin (KEFLEX) 500 MG capsule   Other Relevant Orders   C-reactive protein   Sedimentation rate     [name], MD [name] [place] [phone] pager   [phone] cell 11/03/2020, 2:24 PM

## 2020-11-03 NOTE — Assessment & Plan Note (Signed)
She is clinically improved.  It is still a question however whether or not her flexion is cured or simply being suppressed.  She would like to continue cephalexin and get repeat lab work today.  She will follow-up in 3 months.

## 2020-11-04 LAB — SEDIMENTATION RATE: Sed Rate: 28 mm/h (ref 0–30)

## 2020-11-04 LAB — C-REACTIVE PROTEIN: CRP: 18.5 mg/L — ABNORMAL HIGH (ref <8.0)

## 2020-11-05 ENCOUNTER — Telehealth: Payer: Self-pay | Admitting: *Deleted

## 2020-11-05 ENCOUNTER — Other Ambulatory Visit: Payer: Self-pay

## 2020-11-05 ENCOUNTER — Encounter: Payer: Self-pay | Admitting: Vascular Surgery

## 2020-11-05 ENCOUNTER — Ambulatory Visit: Payer: PPO | Admitting: Vascular Surgery

## 2020-11-05 VITALS — BP 119/70 | HR 100 | Temp 97.9°F | Resp 20 | Ht 67.0 in | Wt 286.0 lb

## 2020-11-05 DIAGNOSIS — M79605 Pain in left leg: Secondary | ICD-10-CM

## 2020-11-05 DIAGNOSIS — M7989 Other specified soft tissue disorders: Secondary | ICD-10-CM | POA: Diagnosis not present

## 2020-11-05 NOTE — Telephone Encounter (Signed)
Pt decided that she would like to proceed with referral to general surgery for gastric bypass. Referral will be put in to [place] [facility].

## 2020-11-05 NOTE — Progress Notes (Signed)
Referring Physician:Krista Kroeger PA  Patient name: Brandi Stephens MRN: [id number] DOB: 03-29-1946 Sex: female  REASON FOR CONSULT: Leg swelling  HPI: Brandi Stephens is a 75 y.o. female, with a several year history of bilateral leg swelling.  She intermittently wears compression stockings but has difficulty applying these due to her severe back pain and her obesity.  Patient has never had any prior history of DVT that she knows of.  She does have a history of other medical problems include arthritis chronic back pain, COPD all of which have been stable.  Past Medical History:  Diagnosis Date   AIN (anal intraepithelial neoplasia) anal canal    Anemia    with pregnancy   Anxiety    Arthritis    knees, lower back, knees   Chronic constipation    Chronic diastolic CHF (congestive heart failure) ([place]) 07/05/2019   CKD (chronic kidney disease)    Coarse tremors    Essential   COPD (chronic obstructive pulmonary disease) ([place])    2017 chest CT, history of   Degenerative lumbar spinal stenosis    Depression    Depression 07/05/2019   Drug dependency ([place])    Elevated PTHrP level 05/09/2019   ETOH abuse 07/05/2019   GERD (gastroesophageal reflux disease)    pt denies   Gout    Grade I diastolic dysfunction 70/17/7939   Noted ECHO   Hemorrhoids    History of adenomatous polyp of colon    History of basal cell carcinoma (BCC) excision 2015   nose   History of sepsis 05/14/2014   post lumbar surgery   History of shingles    x2   Hyperlipidemia    Hypertension    Hypothyroidism    Irregular heart beat    history of while undergoing radiation   Malignant neoplasm of upper-inner quadrant of left breast in female, estrogen receptor positive [facility]) oncologist-  dr Jana Hakim--  per lov in epic no recurrence   dx 07-13-2015  Left breast invasive ductal carcinoma, Stage IA, Grade 1 (TXN0),  09-16-2015  s/p  left breast lumpectomy with sln bx's,  completed radiation 11-18-2015,   started antiestrogen therapy 12-14-2015   Neuropathy    Left foot and back of left leg   Obesity (BMI 30-39.9) 07/05/2019   Personal history of radiation therapy    completed 11-18-2015  left breast   Pneumonia    PONV (postoperative nausea and vomiting)    Prediabetes    diet controlled, no med   Restless leg syndrome    Seasonal allergies    Seizure ([place]) 05/2015   due to sepsis, only one time episode   Tremor    Wears partial dentures    lower    Past Surgical History:  Procedure Laterality Date   ABDOMINAL HYSTERECTOMY  1985   BASAL CELL CARCINOMA EXCISION  10/15   nose   BREAST BIOPSY Right 08/24/2015   BREAST BIOPSY Right 07/13/2015   BREAST BIOPSY Left 07/13/2015   BREAST EXCISIONAL BIOPSY Left 1972   BREAST LUMPECTOMY Left    BREAST LUMPECTOMY WITH RADIOACTIVE SEED AND SENTINEL LYMPH NODE BIOPSY Left 09/16/2015   Procedure: BREAST LUMPECTOMY WITH RADIOACTIVE SEED AND SENTINEL LYMPH NODE BIOPSY;  Surgeon: Stark Klein, MD;  Location: [facility];  Service: General;  Laterality: Left;   BREAST REDUCTION SURGERY Bilateral 1998   CATARACT EXTRACTION W/ INTRAOCULAR LENS IMPLANT Left 2016   [place]   hallo  COLONOSCOPY  01/2013   polyps   EYE SURGERY Left 2016   HAND SURGERY Left 02-19-2002   dr Fredna Dow  @[facility]    repair collateral ligament/ MPJ left thumb   HARDWARE REMOVAL N/A 07/24/2019   Procedure: REVISION OF HARDWARE Lumbar two - Sacral one. Extention of Fusion to Lumbar one;  Surgeon: [name], MD;  Location: [facility];  Service: Neurosurgery;  Laterality: N/A;   HEMORRHOID SURGERY  2008   HIGH RESOLUTION ANOSCOPY N/A 02/28/2018   Procedure: HIGH RESOLUTION ANOSCOPY WITH BIOPSY;  Surgeon: Leighton Ruff, MD;  Location: [facility];  Service: General;  Laterality: N/A;   KNEE ARTHROSCOPY Right 2002   LUMBAR SPINE SURGERY  03-20-2015   dr Kathyrn Sheriff   fusion L4-5, L5-S1   MOUTH SURGERY  07/14/2018   2 infected teeth  removed   MULTIPLE TOOTH EXTRACTIONS     with bone grafting lower bottom right and left   REDUCTION MAMMAPLASTY Bilateral    RIGHT COLECTOMY  09-10-2003   dr Margot Chimes @[facility]    multiple colon polyps (per path tubular adenoma's, hyperplastic , benign appendix, benign two lymph nodes   ROTATOR CUFF REPAIR Left 04/2016   TONSILLECTOMY  1969   TUBAL LIGATION Bilateral 1982    Family History  Problem Relation Age of Onset   Atrial fibrillation Mother    Ulcers Father    Breast cancer Maternal Aunt    Lung cancer Maternal Uncle    Stomach cancer Maternal Grandmother    Lung cancer Maternal Aunt    Brain cancer Maternal Aunt    Prostate cancer Maternal Grandfather    Stroke Paternal Grandmother    Tuberculosis Paternal Grandfather    Colon cancer Neg Hx    Esophageal cancer Neg Hx    Rectal cancer Neg Hx     SOCIAL HISTORY: Social History   Socioeconomic History   Marital status: Married    Spouse name: robert   Number of children: 3   Years of education: college   Highest education level: Not on file  Occupational History   Occupation: retired  Tobacco Use   Smoking status: Former    Packs/day: 0.50    Years: 35.00    Pack years: 17.50    Types: Cigarettes    Quit date: 05/22/2018    Years since quitting: 2.4   Smokeless tobacco: Never  Vaping Use   Vaping Use: Never used  Substance and Sexual Activity   Alcohol use: Yes    Comment: 1 glass of wine a night   Drug use: Not Currently    Comment: CBD tincture   Sexual activity: Yes    Birth control/protection: Post-menopausal  Other Topics Concern   Not on file  Social History Narrative   Lives with husband in a 2 story home.  Has 2 living children.  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  Education: college.    Right-handed.   Five cups coffee per week.   Social Determinants of Health   Financial Resource Strain: Not on file  Food Insecurity: Not on file  Transportation Needs: Not on file  Physical  Activity: Not on file  Stress: Not on file  Social Connections: Not on file  Intimate Partner Violence: Not on file    Allergies  Allergen Reactions   Ampicillin Hives and Other (See Comments)    Severe reaction in February 2017 1.5 month to have hives to go away   Gabapentin Swelling    UNSPECIFIED REACTION  Penicillins Hives and Other (See Comments)    Severe reaction in February 2017 1.5 month to have hives to go away Has patient had a PCN reaction causing immediate rash, facial/tongue/throat swelling, SOB or lightheadedness with hypotension: #  #  #  YES  #  #  #  Has patient had a PCN reaction causing severe rash involving mucus membranes or skin necrosis: No Has patient had a PCN reaction that required hospitalization No Has patient had a PCN reaction occurring within the last 10 years: No    Codeine Nausea And Vomiting   Other Itching    UNSPECIFIED Analgesics    Current Outpatient Medications  Medication Sig Dispense Refill   albuterol (VENTOLIN HFA) 108 (90 Base) MCG/ACT inhaler INHALE 2 PUFFS BY MOUTH EVERY 4 TO 6 HOURS AS NEEDED 42.5 g 0   allopurinol (ZYLOPRIM) 300 MG tablet TAKE 1 TABLET(300 MG) BY MOUTH DAILY 90 tablet 0   amitriptyline (ELAVIL) 75 MG tablet TAKE 1 TABLET(75 MG) BY MOUTH AT BEDTIME 90 tablet 3   anastrozole (ARIMIDEX) 1 MG tablet TAKE 1 TABLET(1 MG) BY MOUTH DAILY 90 tablet 3   atorvastatin (LIPITOR) 20 MG tablet TAKE 1 TABLET BY MOUTH EVERY DAY 90 tablet 0   betamethasone dipropionate 0.05 % cream Apply topically 2 (two) times daily.     Biotin 3 MG TABS Take by mouth.     cephALEXin (KEFLEX) 500 MG capsule TAKE 1 CAPSULE(500 MG) BY MOUTH THREE TIMES DAILY 90 capsule 3   Cholecalciferol (D3 ADULT PO) Take by mouth.     diazepam (VALIUM) 5 MG tablet Take 1 tablet (5 mg total) by mouth daily as needed for anxiety. 30 tablet 0   diclofenac Sodium (VOLTAREN) 1 % GEL Apply 4 g topically 4 (four) times daily.     docusate sodium (COLACE) 100 MG capsule  Take 1 capsule (100 mg total) by mouth 3 (three) times daily. (Patient taking differently: Take 100 mg by mouth 2 (two) times daily.) 10 capsule 0   DULoxetine (CYMBALTA) 60 MG capsule TAKE 1 CAPSULE(60 MG) BY MOUTH DAILY 90 capsule 1   fluorometholone (FML) 0.1 % ophthalmic suspension SMARTSIG:In Eye(s)     Fluticasone-Salmeterol (ADVAIR) 250-50 MCG/DOSE AEPB Inhale 1 puff into the lungs every 12 (twelve) hours. 60 each 11   furosemide (LASIX) 40 MG tablet Take 1 tablet (40 mg total) by mouth daily. 90 tablet 3   levothyroxine (SYNTHROID) 75 MCG tablet TAKE 1 TABLET(75 MCG) BY MOUTH DAILY BEFORE BREAKFAST 90 tablet 1   lidocaine (LIDODERM) 5 % UNWRAP AND APPLY 2 PATCHES TO SKIN DAILY, REMOVE AND DISCARD PATCH WITHIN 12 HOURS OR AS DIRECTED BY DOCTOR 180 patch 1   magnesium gluconate (MAGONATE) 500 MG tablet Take 2 tablets (1,000 mg total) by mouth at bedtime. 60 tablet 0   metoprolol succinate (TOPROL-XL) 25 MG 24 hr tablet Take 1 tablet (25 mg total) by mouth daily. 90 tablet 3   Omega-3 Fatty Acids (OMEGA-3 FISH OIL PO) Take by mouth.     polyethylene glycol powder (GLYCOLAX/MIRALAX) 17 GM/SCOOP powder Take 17 g by mouth daily as needed for moderate constipation.      primidone (MYSOLINE) 50 MG tablet TAKE 1 TABLET BY MOUTH TWICE DAILY 180 tablet 3   Probiotic Product (PROBIOTIC DAILY PO) Take by mouth.     QUEtiapine (SEROQUEL) 25 MG tablet TAKE 1 TABLET(25 MG) BY MOUTH AT BEDTIME 90 tablet 1   rOPINIRole (REQUIP) 0.5 MG tablet TAKE 1 AND 1/2 TABLETS(0.75  MG) BY MOUTH AT BEDTIME 140 tablet 1   vitamin B-12 (CYANOCOBALAMIN) 1000 MCG tablet Take 1 tablet (1,000 mcg total) by mouth daily. 30 tablet 0   No current facility-administered medications for this visit.    ROS:   General:  No weight loss, Fever, chills  HEENT: No recent headaches, no nasal bleeding, no visual changes, no sore throat  Neurologic: No dizziness, blackouts, seizures. No recent symptoms of stroke or mini- stroke. No  recent episodes of slurred speech, or temporary blindness.  Cardiac: No recent episodes of chest pain/pressure, no shortness of breath at rest.  No shortness of breath with exertion.  Denies history of atrial fibrillation or irregular heartbeat  Vascular: No history of rest pain in feet.  No history of claudication.  No history of non-healing ulcer, No history of DVT   Pulmonary: No home oxygen, no productive cough, no hemoptysis,  No asthma or wheezing  Musculoskeletal:  [ ]  Arthritis, [ ]  Low back pain,  [ ]  Joint pain  Hematologic:No history of hypercoagulable state.  No history of easy bleeding.  No history of anemia  Gastrointestinal: No hematochezia or melena,  No gastroesophageal reflux, no trouble swallowing  Urinary: [ ]  chronic Kidney disease, [ ]  on HD - [ ]  MWF or [ ]  TTHS, [ ]  Burning with urination, [ ]  Frequent urination, [ ]  Difficulty urinating;   Skin: No rashes  Psychological: No history of anxiety,  No history of depression   Physical Examination  Vitals:   11/05/20 1415  BP: 119/70  Pulse: 100  Resp: 20  Temp: 97.9 F (36.6 C)  SpO2: 90%  Weight: 286 lb (129.7 kg)  Height: 5\' 7"  (1.702 m)    Body mass index is 44.79 kg/m.  General:  Alert and oriented, no acute distress HEENT: Normal Neck: No JVD Cardiac: Regular Rate and Rhythm  Abdomen: Soft, non-tender, non-distended, no mass, no scars Skin: No rash Extremity Pulses:  2+ dorsalis pedis, posterior tibial pulses bilaterally Musculoskeletal: No deformity or 1+ left lower extremity from the knee down to the foot edema  Neurologic: Upper and lower extremity motor 5/5 and symmetric  DATA:  Patient had a venous reflux exam on September 21, 2020 which was normal.  There was no deep or superficial venous reflux.  ASSESSMENT: Chronic lower extremity edema most likely secondary to venous hypertension obesity no significant reflux for consideration of vascular intervention   PLAN: Patient was again given  recommendation for compression stockings.  She will follow-up with [place] on an as-needed basis.  I did discuss with her the possibility of weight loss reduction operation to try to deal with her obesity to improve her overall condition.  She is going to discuss this further with her husband.   Ruta Hinds, MD [facility] Office: [phone]

## 2020-11-09 ENCOUNTER — Telehealth: Payer: Self-pay | Admitting: Family Medicine

## 2020-11-09 DIAGNOSIS — G2581 Restless legs syndrome: Secondary | ICD-10-CM

## 2020-11-09 MED ORDER — ROPINIROLE HCL 0.5 MG PO TABS: ORAL_TABLET | ORAL | 0 refills | Status: DC

## 2020-11-09 NOTE — Telephone Encounter (Signed)
Pharmacy send refill request for ropinirpole 0.5 mg Please send to the [facility], [place]

## 2020-11-09 NOTE — Telephone Encounter (Signed)
Send med in

## 2020-11-13 ENCOUNTER — Ambulatory Visit
Admission: RE | Admit: 2020-11-13 | Discharge: 2020-11-13 | Disposition: A | Discharge status: Home / Self Care | Payer: PPO | Source: Ambulatory Visit | Attending: Oncology | Admitting: Oncology

## 2020-11-13 ENCOUNTER — Other Ambulatory Visit: Payer: Self-pay

## 2020-11-13 DIAGNOSIS — Z853 Personal history of malignant neoplasm of breast: Secondary | ICD-10-CM | POA: Diagnosis not present

## 2020-11-13 DIAGNOSIS — Z780 Asymptomatic menopausal state: Secondary | ICD-10-CM

## 2020-11-13 DIAGNOSIS — Z170 Estrogen receptor positive status [ER+]: Secondary | ICD-10-CM

## 2020-11-13 DIAGNOSIS — R922 Inconclusive mammogram: Secondary | ICD-10-CM | POA: Diagnosis not present

## 2020-11-13 DIAGNOSIS — M8580 Other specified disorders of bone density and structure, unspecified site: Secondary | ICD-10-CM

## 2020-11-17 ENCOUNTER — Encounter: Payer: Self-pay | Admitting: Internal Medicine

## 2020-12-02 ENCOUNTER — Telehealth: Payer: Self-pay | Admitting: Internal Medicine

## 2020-12-02 MED ORDER — DICLOFENAC SODIUM 1 % EX GEL: 4.0000 g | Freq: Four times a day (QID) | CUTANEOUS | 1 refills | Status: DC

## 2020-12-02 NOTE — Telephone Encounter (Signed)
Patient called and would like a refill on voltaren gel. She hasn't to ask for a rx in a while but she is now needing it again due to knee pain and back pain. It is ok to refill this. It is on her med list

## 2020-12-02 NOTE — Telephone Encounter (Signed)
Pt advised it is cheaper and will be delivered to her if she has a script sent in for voltaren  gel. [place]

## 2020-12-07 ENCOUNTER — Ambulatory Visit: Payer: PPO | Admitting: Physical Medicine and Rehabilitation

## 2020-12-08 ENCOUNTER — Other Ambulatory Visit: Payer: Self-pay

## 2020-12-08 MED ORDER — LEVOTHYROXINE SODIUM 75 MCG PO TABS: ORAL_TABLET | ORAL | 1 refills | Status: DC

## 2020-12-17 ENCOUNTER — Encounter: Payer: PPO | Attending: Physical Medicine and Rehabilitation | Admitting: Physical Medicine and Rehabilitation

## 2020-12-17 ENCOUNTER — Other Ambulatory Visit: Payer: Self-pay | Admitting: Physical Medicine and Rehabilitation

## 2020-12-17 ENCOUNTER — Other Ambulatory Visit: Payer: Self-pay

## 2020-12-17 ENCOUNTER — Encounter: Payer: Self-pay | Admitting: Physical Medicine and Rehabilitation

## 2020-12-17 DIAGNOSIS — F419 Anxiety disorder, unspecified: Secondary | ICD-10-CM | POA: Diagnosis not present

## 2020-12-17 DIAGNOSIS — Z7409 Other reduced mobility: Secondary | ICD-10-CM | POA: Diagnosis not present

## 2020-12-17 DIAGNOSIS — G603 Idiopathic progressive neuropathy: Secondary | ICD-10-CM | POA: Diagnosis not present

## 2020-12-17 DIAGNOSIS — M5416 Radiculopathy, lumbar region: Secondary | ICD-10-CM

## 2020-12-17 DIAGNOSIS — G894 Chronic pain syndrome: Secondary | ICD-10-CM

## 2020-12-17 DIAGNOSIS — R600 Localized edema: Secondary | ICD-10-CM

## 2020-12-17 DIAGNOSIS — G8918 Other acute postprocedural pain: Secondary | ICD-10-CM

## 2020-12-17 MED ORDER — DIAZEPAM 5 MG PO TABS: 5.0000 mg | ORAL_TABLET | Freq: Every day | ORAL | 0 refills | Status: DC | PRN

## 2020-12-17 MED ORDER — VITAMIN D (ERGOCALCIFEROL) 50000 UNITS PO CAPS: 1.0000 | ORAL_CAPSULE | ORAL | 0 refills | Status: DC

## 2020-12-17 NOTE — Progress Notes (Signed)
Subjective:    Patient ID: Brandi Stephens, female    DOB: May 17, 1945, 75 y.o.   MRN: [id number]  An audio/video tele-health visit is felt to be the most appropriate encounter for this patient at this time.  This is a follow up tele-visit via phone. The patient is at home. MD is at office.    Brandi Stephens presents for follow-up after CIR admission for staph bacteremia after infection of her spinal hardware. Everything is the same since last visit. She is having more trouble with neuropathy in her feet and hands. The Cymbalta '60mg'$  does help somewhat. She used to get swelling with Gabapentin. The pain is bad during the day and night. Pain is improved to 4. She cannot feel her two little toes. Discussed Lyrica as an option. She would like try the Sprint PNS. Her back pain is the most severe. She has hardware. She follows with Dr. Bridget Hartshorn at the end of the month. The pain in her back feels like a dagger. The pain is more directly on the spine.   The Amitriptyline helps her to sleep a little better. She has improved sleep from 1 to 4 hours. She is having severe neuropathy in her left foot. The Cymbalta is helping with the neuropathic pain in her hands. She has been tolerating this medication well.   Last visit I transitioned her to outaptient therapy and she has an appointment today. She has noted improvements in her strength. She is ambulating steadily with RW today. She has bene trying using a cane with therapy. Outpatient therapy is doing well. She has been taking 1/2 a tablet of hydrocodone to help her tolerate better (prescribed by her surgeon).   Pain is currently 5/10 and worst in her lower back where her spinal harware is present as well as in paraspinals. No longer acquiring She has been taking approximately 1 percocet per day and would like to gradually stop using these. She has also been using four '650mg'$  of Tylenol per day and two '200mg'$  ibuprofen daily.   She no longer has severe diarrhea or  constipation and is much happier about this. It does come and go. She takes '1000mg'$  magensium at night. She takes colace BID. She likes to eat healthy foods.   Her daughter accompanies her this visit and helps to provide history. She and her daughter have been out of work for 5 years due to her health conditions and need for her daughter's assistance. Her grandchild recently graduated kindergarten.   1) B/l lower extremity edema She continues to have mild bilateral lower extremity edema.  Discussed use of compression garments, elevation, and ice.  Discussed the benefits of lymphedema therapy and she is willing to try this.  -She has been having pooling of fluids throughout her body -She has never seen nephrology but has followed with cardiology -Lasix administration is limited due to her creatinine.   2) Bilateral knees: -she has fallen on her knees so many times.  -she has been using blue emu oil. -she has been using voltaren gel.  -she has a history of arthroscopic surgery and steroid injections -she uses turmeric  3) Muscle spasms -the Valium does help  4) Peripheral neuropathy: -'60mg'$  Cymbalta is helping. -She would like to try Qutenza next visit -it is really affecting her quality of life  5) Prediabetes -has been eating very healthy foods  6) Anxiety: -they Valium helps -she appreciates her husband's and daughter's care.     Pain Inventory Average  Pain 4 Pain Right Now 2 My pain is constant, sharp, burning, stabbing and aching  In the last 24 hours, has pain interfered with the following? General activity 4 Relation with others 0 Enjoyment of life 5 What TIME of day is your pain at its worst?  varies Sleep (in general) Poor  Pain is worse with: walking, bending, sitting and standing Pain improves with: rest, heat/ice, therapy/exercise and medication Relief from Meds: 2        Family History  Problem Relation Age of Onset   Atrial fibrillation Mother     Ulcers Father    Breast cancer Maternal Aunt    Lung cancer Maternal Uncle    Stomach cancer Maternal Grandmother    Lung cancer Maternal Aunt    Brain cancer Maternal Aunt    Prostate cancer Maternal Grandfather    Stroke Paternal Grandmother    Tuberculosis Paternal Grandfather    Colon cancer Neg Hx    Esophageal cancer Neg Hx    Rectal cancer Neg Hx    Social History   Socioeconomic History   Marital status: Married    Spouse name: robert   Number of children: 3   Years of education: college   Highest education level: Not on file  Occupational History   Occupation: retired  Tobacco Use   Smoking status: Former    Packs/day: 0.50    Years: 35.00    Pack years: 17.50    Types: Cigarettes    Quit date: 05/22/2018    Years since quitting: 2.5   Smokeless tobacco: Never  Vaping Use   Vaping Use: Never used  Substance and Sexual Activity   Alcohol use: Yes    Comment: 1 glass of wine a night   Drug use: Not Currently    Comment: CBD tincture   Sexual activity: Yes    Birth control/protection: Post-menopausal  Other Topics Concern   Not on file  Social History Narrative   Lives with husband in a 2 story home.  Has 2 living children.  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  Education: college.    Right-handed.   Five cups coffee per week.   Social Determinants of Health   Financial Resource Strain: Not on file  Food Insecurity: Not on file  Transportation Needs: Not on file  Physical Activity: Not on file  Stress: Not on file  Social Connections: Not on file   Past Surgical History:  Procedure Laterality Date   ABDOMINAL HYSTERECTOMY  1985   BASAL CELL CARCINOMA EXCISION  10/15   nose   BREAST BIOPSY Right 08/24/2015   BREAST BIOPSY Right 07/13/2015   BREAST BIOPSY Left 07/13/2015   BREAST EXCISIONAL BIOPSY Left 1972   BREAST LUMPECTOMY Left    BREAST LUMPECTOMY WITH RADIOACTIVE SEED AND SENTINEL LYMPH NODE BIOPSY Left 09/16/2015   Procedure:  BREAST LUMPECTOMY WITH RADIOACTIVE SEED AND SENTINEL LYMPH NODE BIOPSY;  Surgeon: Stark Klein, MD;  Location: [facility];  Service: General;  Laterality: Left;   BREAST REDUCTION SURGERY Bilateral 1998   CATARACT EXTRACTION W/ INTRAOCULAR LENS IMPLANT Left 2016   [place]   hallo   COLONOSCOPY  01/2013   polyps   EYE SURGERY Left 2016   HAND SURGERY Left 02-19-2002   dr Fredna Dow  '@[facility]'$    repair collateral ligament/ MPJ left thumb   HARDWARE REMOVAL N/A 07/24/2019   Procedure: REVISION OF HARDWARE Lumbar two - Sacral one. Extention of Fusion  to Lumbar one;  Surgeon: [name], MD;  Location: [facility];  Service: Neurosurgery;  Laterality: N/A;   HEMORRHOID SURGERY  2008   HIGH RESOLUTION ANOSCOPY N/A 02/28/2018   Procedure: HIGH RESOLUTION ANOSCOPY WITH BIOPSY;  Surgeon: Leighton Ruff, MD;  Location: [place];  Service: General;  Laterality: N/A;   KNEE ARTHROSCOPY Right 2002   LUMBAR SPINE SURGERY  03-20-2015   dr Kathyrn Sheriff   fusion L4-5, L5-S1   MOUTH SURGERY  07/14/2018   2 infected teeth removed   MULTIPLE TOOTH EXTRACTIONS     with bone grafting lower bottom right and left   REDUCTION MAMMAPLASTY Bilateral    RIGHT COLECTOMY  09-10-2003   dr Margot Chimes '@[facility]'$    multiple colon polyps (per path tubular adenoma's, hyperplastic , benign appendix, benign two lymph nodes   ROTATOR CUFF REPAIR Left 04/2016   TONSILLECTOMY  1969   TUBAL LIGATION Bilateral 1982   Past Medical History:  Diagnosis Date   AIN (anal intraepithelial neoplasia) anal canal    Anemia    with pregnancy   Anxiety    Arthritis    knees, lower back, knees   Chronic constipation    Chronic diastolic CHF (congestive heart failure) ([place]) 07/05/2019   CKD (chronic kidney disease)    Coarse tremors    Essential   COPD (chronic obstructive pulmonary disease) ([place])    2017 chest CT, history of   Degenerative lumbar spinal stenosis    Depression    Depression  07/05/2019   Drug dependency ([place])    Elevated PTHrP level 05/09/2019   ETOH abuse 07/05/2019   GERD (gastroesophageal reflux disease)    pt denies   Gout    Grade I diastolic dysfunction 0000000   Noted ECHO   Hemorrhoids    History of adenomatous polyp of colon    History of basal cell carcinoma (BCC) excision 2015   nose   History of sepsis 05/14/2014   post lumbar surgery   History of shingles    x2   Hyperlipidemia    Hypertension    Hypothyroidism    Irregular heart beat    history of while undergoing radiation   Malignant neoplasm of upper-inner quadrant of left breast in female, estrogen receptor positive [facility]) oncologist-  dr Jana Hakim--  per lov in epic no recurrence   dx 07-13-2015  Left breast invasive ductal carcinoma, Stage IA, Grade 1 (TXN0),  09-16-2015  s/p  left breast lumpectomy with sln bx's,  completed radiation 11-18-2015,  started antiestrogen therapy 12-14-2015   Neuropathy    Left foot and back of left leg   Obesity (BMI 30-39.9) 07/05/2019   Personal history of radiation therapy    completed 11-18-2015  left breast   Pneumonia    PONV (postoperative nausea and vomiting)    Prediabetes    diet controlled, no med   Restless leg syndrome    Seasonal allergies    Seizure ([place]) 05/2015   due to sepsis, only one time episode   Tremor    Wears partial dentures    lower    There were no vitals taken for this visit.  Opioid Risk Score:   Fall Risk Score:  `1  Depression screen PHQ 2/9  Depression screen [facility] 2/9 08/07/2020 08/05/2020 08/04/2020 06/10/2020 02/20/2020 01/23/2020 01/22/2020  Decreased Interest 0 0 0 0 0 0 0  Down, Depressed, Hopeless 0 0 0 0 0 0 0  PHQ - 2 Score 0 0 0 0 0  0 0  Altered sleeping - - - - - - -  Tired, decreased energy - - - - - - -  Change in appetite - - - - - - -  Feeling bad or failure about yourself  - - - - - - -  Trouble concentrating - - - - - - -  Moving slowly or fidgety/restless - - - - - - -  Suicidal thoughts - -  - - - - -  PHQ-9 Score - - - - - - -  Difficult doing work/chores - - - - - - -  Some recent data might be hidden    Review of Systems  Constitutional: Negative.   HENT: Negative.    Eyes: Negative.   Respiratory: Negative.    Cardiovascular: Negative.   Gastrointestinal: Negative.   Endocrine: Negative.   Genitourinary: Negative.   Musculoskeletal:  Positive for gait problem.  Skin: Negative.   Allergic/Immunologic: Negative.   Neurological:  Positive for dizziness, tremors, weakness and numbness.       Tingling   Hematological: Negative.   Psychiatric/Behavioral: Negative.    All other systems reviewed and are negative.     Objective:   Physical Exam Not performed as she was seen via phone visit.   Assessment & Plan:  Brandi Stephens is a 75 year old female who presents for hospital f/u after CIR admission post back surgery, complicated by staph infection, now on chronic antibiotics (she is on a daily probiotic- encouraged this to protect her gut). Has had follow-up with neurosurgery.   Diarrhea: This was a huge issue previously and has since improved. I had obtained an XR that showed constipation and recommended an enema. Continue magnesium '1000mg'$  at night and colace BID. This continues but is improved.   Impaired mobility and ADLs following spinal surgery: -Continue HEP Progressing well. Increase walking to 10 minutes per day -Recommended exercise bike at home to build back endurance. Continue walking outside as well as strength and gait are improving and she is no longer limited by diarrhea. -She has been having a lot of falls at home  Post-operative lower back pain: -Stopped oxycodone- was prescribed 1/2 pill hydrocodone by her her spine surgery.  -Continue to supplement with icing, Tyelnol, and Ibuprofen.   Peripheral neuropathy: -Discussed Sprint PNS system as an option of pain treatment via neuromodulation. Provided following link for patient to learn more about the  system: https://www.sprtherapeutics.com/. She would like to try this. Will target sciatic nerve at the popliteal fossa.  -Refilled Amitriptyline to '75mg'$  HS PRN.  The patient has severe pain that has been shown to be refractory to conventional medical management with physical therapy, a number of pharmacologic options, minimally invasive procedures, and pain coping skills. Function is severely limited due to the pain and ability to perform self-care and activities of daily living is marginal. The patient has responded partially to opiate medications. At this point, having failed most conservative therapies, I consider peripheral nerve stimulation to be medically necessary to reduce his pain, increase his functionality, and in order to avoid opiate dose escalation and the concomitant risks of tolerance, physical dependence, misuse, abuse, addiction, endocrine dysfunction, respiratory depression, and death.  -Discussed Qutenza as an option for neuropathic pain control. Discussed that this is a capsaicin patch, stronger than capsaicin cream. Discussed that it is currently approved for diabetic peripheral neuropathy and post-herpetic neuralgia, but that it has also shown benefit in treating other forms of neuropathy. Provided patient with link  to site to learn more about the patch: [employer]. Discussed that the patch would be placed in office and benefits usually last 3 months. Discussed that unintended exposure to capsaicin can cause severe irritation of eyes, mucous membranes, respiratory tract, and skin, but that Qutenza is a local treatment and does not have the systemic side effects of other nerve medications. Discussed that there may be pain, itching, erythema, and decreased sensory function associated with the application of Qutenza. Side effects usually subside within 1 week. A cold pack of analgesic medications can help with these side effects. Blood pressure can also be increased due to pain  associated with administration of the patch.   Anixety: Refilled Diazepam PRN. Provided refill. Has been taking twice per day and this has been helping her. Also helps with her muscle spasms. This has been well controlled. She is sometimes upset that she is only comfortably horizontally.   Tachycardia: provided higher dose of metoprolol which she was on before last visit. HR is still slightly elevated today, but BP is low. Maintain current dose. She says BP was elevated at her doctor's appointment yesterday. It fluctuates.   Lower extremity edema: advised elevation, ice, and compression garments which she is already doing.  -continue Lasix -start lymphedema therapy  Hot flashes: from [place].   Home support: Living with husband. Daughter lives 5 miles away.   Bilateral knee pain: -continue voltaren gel and blue emu oil -recommended bacopa  30 minutes spent in discussion of pain, lower extremity edema, gastric bypass, anxiety, peripheral neuropathy, bilateral knee pain

## 2020-12-22 NOTE — Progress Notes (Signed)
Cardiology Office Note   Date:  12/23/2020   ID:  Brandi Stephens, Brandi Stephens 05-Sep-1945, MRN [id number]  PCP:  Girtha Rm, NP-C  Cardiologist:  [name], MD EP: None  Chief Complaint  Patient presents with   Follow-up    3 months.   Shortness of Breath   Edema      History of Present Illness: Brandi Stephens is a 75 y.o. female with a PMH of chronic diastolic CHF, HTN, HLD, hypothyroidism, GERD, breast cancer s/p lumpectomy and XRT in 2017, and CKD stage 3a who presents for follow-up of her LE edema.  She was last evaluated by cardiology at an outpatient visit with myself 09/16/20 at which time she had ongoing complaints of LE edema suspicious for venous insufficiency. She was sent for venous reflux studies which were negative and referred to [facility].   She saw Dr. Oneida Alar with [facility] for her LE edema. He felt her edema was likely 2/2 venous HTN obesity and there was no significant reflux for consideration of vascular intervention. She was again recommended for compression stockings and consideration for weight loss reduction operation to manage her obesity and improver her overall condition. A referral was placed to [facility].  Echocardiogram 06/2020 showed EF 65-70%, moderate concentric LVH, G1DD, no RWMA, normal RV size/function, and mildly dilated ascending aorta (60m). Her last ischemic evaluation was a NST in 2017 which was without ischemia.  She presents today for 3 month follow-up. She continues to have quite a bit of LE edema which is overall unchanged in the past 3 months. She understands that there are no good options for managing her venous insufficiency after her visit with Dr. FOneida Alar  We discussed obtaining zipper on compression stockings which she may have an easier time applying with her ongoing back pain.  She had a mechanical fall landing on her back but unfortunately pulled a nightstand over onto herself in the process which resulted in breaking her front teeth.  She is  working with her dentist to determine next steps to repair/replace them.  She has been speaking with her other medical providers in anticipation of pursuing possible gastric bypass surgery.  Her activity has been very limited with her back pain and knee pain.  She has chronic dyspnea on exertion, likely contributed to by her weight and deconditioning.  She has no complaints of chest pain.  She is unable to complete 4 METS at this time.  She seems very disheartened by the state of her health over the past couple years but reports she has a good support system at home.     Past Medical History:  Diagnosis Date   AIN (anal intraepithelial neoplasia) anal canal    Anemia    with pregnancy   Anxiety    Arthritis    knees, lower back, knees   Chronic constipation    [place]ronic diastolic [place]ive [place] 07[place]   CKD (chronic kidney disease)    Coarse tremors    Essential   [place]    2017 chest CT, history of   Degenerative lumbar spinal stenosis    Depression    Depression 07/05/2019   Drug dependency [place]    Elevated PTHrP level 05/09/2019   ETOH abuse 07/05/2019   GERD (gastroesophageal reflux disease)    pt denies   Gout    Grade I diastolic dysfunction 10000000  Noted ECHO   Hemorrhoids    History  of adenomatous polyp of colon    History of basal cell carcinoma (BCC) excision 2015   nose   History of sepsis 05/14/2014   post lumbar surgery   History of shingles    x2   Hyperlipidemia    Hypertension    Hypothyroidism    Irregular heart beat    history of while undergoing radiation   Malignant neoplasm of upper-inner quadrant of left breast in female, estrogen receptor positive [facility]) oncologist-  dr Jana Hakim--  per lov in epic no recurrence   dx 07-13-2015  Left breast invasive ductal carcinoma, Stage IA, Grade 1 (TXN0),  09-16-2015  s/p  left breast lumpectomy with sln bx's,  completed radiation 11-18-2015,   started antiestrogen therapy 12-14-2015   Neuropathy    Left foot and back of left leg   Obesity (BMI 30-39.9) 07/05/2019   Personal history of radiation therapy    completed 11-18-2015  left breast   Pneumonia    PONV (postoperative nausea and vomiting)    Prediabetes    diet controlled, no med   Restless leg syndrome    Seasonal allergies    Seizure ([place]) 05/2015   due to sepsis, only one time episode   Tremor    Wears partial dentures    lower     Past Surgical History:  Procedure Laterality Date   ABDOMINAL HYSTERECTOMY  1985   BASAL CELL CARCINOMA EXCISION  10/15   nose   BREAST BIOPSY Right 08/24/2015   BREAST BIOPSY Right 07/13/2015   BREAST BIOPSY Left 07/13/2015   BREAST EXCISIONAL BIOPSY Left 1972   BREAST LUMPECTOMY Left    BREAST LUMPECTOMY WITH RADIOACTIVE SEED AND SENTINEL LYMPH NODE BIOPSY Left 09/16/2015   Procedure: BREAST LUMPECTOMY WITH RADIOACTIVE SEED AND SENTINEL LYMPH NODE BIOPSY;  Surgeon: Stark Klein, MD;  Location: [place];  Service: General;  Laterality: Left;   BREAST REDUCTION SURGERY Bilateral 1998   CATARACT EXTRACTION W/ INTRAOCULAR LENS IMPLANT Left 2016   [place]   hallo   COLONOSCOPY  01/2013   polyps   EYE SURGERY Left 2016   HAND SURGERY Left 02-19-2002   dr Fredna Dow  '@[facility]'$    repair collateral ligament/ MPJ left thumb   HARDWARE REMOVAL N/A 07/24/2019   Procedure: REVISION OF HARDWARE Lumbar two - Sacral one. Extention of Fusion to Lumbar one;  Surgeon: [name], MD;  Location: [place];  Service: Neurosurgery;  Laterality: N/A;   HEMORRHOID SURGERY  2008   HIGH RESOLUTION ANOSCOPY N/A 02/28/2018   Procedure: HIGH RESOLUTION ANOSCOPY WITH BIOPSY;  Surgeon: Leighton Ruff, MD;  Location: [place];  Service: General;  Laterality: N/A;   KNEE ARTHROSCOPY Right 2002   LUMBAR SPINE SURGERY  03-20-2015   dr Kathyrn Sheriff   fusion L4-5, L5-S1   MOUTH SURGERY  07/14/2018   2 infected  teeth removed   MULTIPLE TOOTH EXTRACTIONS     with bone grafting lower bottom right and left   REDUCTION MAMMAPLASTY Bilateral    RIGHT COLECTOMY  09-10-2003   dr Margot Chimes '@[facility]'$    multiple colon polyps (per path tubular adenoma's, hyperplastic , benign appendix, benign two lymph nodes   ROTATOR CUFF REPAIR Left 04/2016   TONSILLECTOMY  1969   TUBAL LIGATION Bilateral 1982     Current Outpatient Medications  Medication Sig Dispense Refill   albuterol (VENTOLIN HFA) 108 (90 Base) MCG/ACT inhaler INHALE 2 PUFFS BY MOUTH EVERY 4 TO 6 HOURS AS NEEDED 42.5  g 0   allopurinol (ZYLOPRIM) 300 MG tablet TAKE 1 TABLET(300 MG) BY MOUTH DAILY 90 tablet 0   amitriptyline (ELAVIL) 75 MG tablet TAKE 1 TABLET(75 MG) BY MOUTH AT BEDTIME 90 tablet 3   anastrozole (ARIMIDEX) 1 MG tablet TAKE 1 TABLET(1 MG) BY MOUTH DAILY 90 tablet 3   atorvastatin (LIPITOR) 20 MG tablet TAKE 1 TABLET BY MOUTH EVERY DAY 90 tablet 0   betamethasone dipropionate 0.05 % cream Apply topically 2 (two) times daily.     Biotin 3 MG TABS Take by mouth.     cephALEXin (KEFLEX) 500 MG capsule TAKE 1 CAPSULE(500 MG) BY MOUTH THREE TIMES DAILY 90 capsule 3   Cholecalciferol (D3 ADULT PO) Take by mouth.     diazepam (VALIUM) 5 MG tablet Take 1 tablet (5 mg total) by mouth daily as needed for anxiety. 90 tablet 0   diclofenac Sodium (VOLTAREN) 1 % GEL Apply 4 g topically 4 (four) times daily. 350 g 1   docusate sodium (COLACE) 100 MG capsule Take 1 capsule (100 mg total) by mouth 3 (three) times daily. (Patient taking differently: Take 100 mg by mouth 2 (two) times daily.) 10 capsule 0   DULoxetine (CYMBALTA) 60 MG capsule TAKE 1 CAPSULE(60 MG) BY MOUTH DAILY 90 capsule 1   fluorometholone (FML) 0.1 % ophthalmic suspension SMARTSIG:In Eye(s)     Fluticasone-Salmeterol (ADVAIR) 250-50 MCG/DOSE AEPB Inhale 1 puff into the lungs every 12 (twelve) hours. 60 each 11   furosemide (LASIX) 40 MG tablet Take 1 tablet (40 mg total) by mouth daily. 90  tablet 3   levothyroxine (SYNTHROID) 75 MCG tablet TAKE 1 TABLET(75 MCG) BY MOUTH DAILY BEFORE BREAKFAST 90 tablet 1   lidocaine (LIDODERM) 5 % UNWRAP AND APPLY 2 PATCHES TO SKIN DAILY, REMOVE AND DISCARD PATCH WITHIN 12 HOURS OR AS DIRECTED BY DOCTOR 180 patch 1   magnesium gluconate (MAGONATE) 500 MG tablet Take 2 tablets (1,000 mg total) by mouth at bedtime. 60 tablet 0   metoprolol succinate (TOPROL-XL) 25 MG 24 hr tablet Take 1 tablet (25 mg total) by mouth daily. 90 tablet 3   Omega-3 Fatty Acids (OMEGA-3 FISH OIL PO) Take by mouth.     polyethylene glycol powder (GLYCOLAX/MIRALAX) 17 GM/SCOOP powder Take 17 g by mouth daily as needed for moderate constipation.      primidone (MYSOLINE) 50 MG tablet TAKE 1 TABLET BY MOUTH TWICE DAILY 180 tablet 3   Probiotic Product (PROBIOTIC DAILY PO) Take by mouth.     QUEtiapine (SEROQUEL) 25 MG tablet TAKE 1 TABLET(25 MG) BY MOUTH AT BEDTIME 90 tablet 1   rOPINIRole (REQUIP) 0.5 MG tablet TAKE 1 AND 1/2 TABLETS(0.75 MG) BY MOUTH AT BEDTIME 140 tablet 0   vitamin B-12 (CYANOCOBALAMIN) 1000 MCG tablet Take 1 tablet (1,000 mcg total) by mouth daily. 30 tablet 0   Vitamin D, Ergocalciferol, 50000 units CAPS Take 1 tablet by mouth once a week. 7 capsule 0   No current facility-administered medications for this visit.    Allergies:   Ampicillin, Gabapentin, Penicillins, Codeine, and Other    Social History:  The patient  reports that she quit smoking about 2 years ago. Her smoking use included cigarettes. She has a 17.50 pack-year smoking history. She has never used smokeless tobacco. She reports current alcohol use. She reports that she does not currently use drugs.   Family History:  The patient's family history includes Atrial fibrillation in her mother; Brain cancer in her maternal aunt; Breast  cancer in her maternal aunt; Lung cancer in her maternal aunt and maternal uncle; Prostate cancer in her maternal grandfather; Stomach cancer in her maternal  grandmother; Stroke in her paternal grandmother; Tuberculosis in her paternal grandfather; Ulcers in her father.    ROS:  Please see the history of present illness.   Otherwise, review of systems are positive for none.   All other systems are reviewed and negative.    PHYSICAL EXAM: VS:  BP 104/68 (BP Location: Left Arm, Patient Position: Sitting, Cuff Size: Large)   Pulse (!) 113   Ht 5' 7.5" (1.715 m)   Wt 287 lb (130.2 kg)   BMI 44.29 kg/m  , BMI Body mass index is 44.29 kg/m. GEN: Well nourished, well developed, in no acute distress HEENT: sclera anicteric Neck: no JVD, carotid bruits, or masses Cardiac: tachycardic, regular rhythm; no murmurs, rubs, or gallops, 2+ LE edema  Respiratory:  clear to auscultation bilaterally, normal work of breathing GI: soft, obese, nontender, nondistended, + BS MS: no deformity or atrophy Skin: warm and dry, no rash Neuro:  Strength and sensation are intact Psych: euthymic mood, full affect   EKG:  EKG is ordered today. The ekg ordered today demonstrates sinus tachycardia, rate 113 bpm, chronic TWI in lateral leads, no STE/D, no significant change from previous.    Recent Labs: 06/04/2020: Hemoglobin 13.0; Platelets 226; TSH 3.530 06/15/2020: ALT 22; BNP 75.4; BUN 30; Creatinine, Ser 1.21; Potassium 4.8; Sodium 140    Lipid Panel    Component Value Date/Time   CHOL 208 (H) 01/31/2020 1040   TRIG 362 (H) 01/31/2020 1040   HDL 53 01/31/2020 1040   CHOLHDL 3.9 01/31/2020 1040   CHOLHDL 4.2 07/08/2016 1042   VLDL 52 (H) 07/08/2016 1042   LDLCALC 95 01/31/2020 1040      Wt Readings from Last 3 Encounters:  12/23/20 287 lb (130.2 kg)  11/05/20 286 lb (129.7 kg)  11/03/20 286 lb (129.7 kg)      Other studies Reviewed: Additional studies/ records that were reviewed today include:   Echocardiogram 06/2020: 1. Left ventricular ejection fraction, by estimation, is 65 to 70%. The  left ventricle has normal function. The left ventricle  has no regional  wall motion abnormalities. There is moderate concentric left ventricular  hypertrophy. Left ventricular  diastolic parameters are consistent with Grade I diastolic dysfunction  (impaired relaxation).   2. Right ventricular systolic function is normal. The right ventricular  size is normal.   3. The mitral valve is grossly normal. No evidence of mitral valve  regurgitation.   4. The aortic valve was not well visualized. Aortic valve regurgitation  is not visualized.   5. Aortic dilatation noted. There is mild dilatation of the ascending  aorta, measuring 39 mm.   Comparison(s): 01/22/16 EF 65-70%.     ASSESSMENT AND PLAN:  1. LE edema, predominately left leg: she numerous venous studies to r/o DVT which have been negative. Venous reflux study 09/2020 was negative. Seen by Dr. Oneida Alar who felt is likely 2/2 venous hypertension obesity and encouraged compression stockings.  - Encouraged zipper on compression stockings which she can obtain online - Continue Lasix - Continue to encourage low-salt diet   2. Chronic diastolic CHF: Overall stable symptoms over the past several months. BNP historically normal. Last echo 06/2020 showed EF 65-70%, G1DD.  - Continue lasix '40mg'$  daily   3. HTN: BP 104/68 today - Continue metoprolol succinate and lasix   4. HLD: LDL 95 01/2020 -  Continue atorvastatin and omega-3  5.  Preop assessment: She is pursuing evaluation for possible gastric bypass surgery.  BMI is 44 today. She has chronic DOE but no complaints of chest pain.  Activity is limited by back and knee pain, as well as weight and deconditioning.  She is unable to complete 4 METS.  She had an echocardiogram 06/2020 which was reassuring. - Will update a nuclear stress test to rule out ischemia prior to possible surgery - Shared Decision Making/Informed Consent{ The risks [chest pain, shortness of breath, cardiac arrhythmias, dizziness, blood pressure fluctuations, myocardial  infarction, stroke/transient ischemic attack, nausea, vomiting, allergic reaction, radiation exposure, metallic taste sensation and life-threatening complications (estimated to be 1 in 10,000)], benefits (risk stratification, diagnosing coronary artery disease, treatment guidance) and alternatives of a nuclear stress test were discussed in detail with Ms. Remaly and she agrees to proceed.   Current medicines are reviewed at length with the patient today.  The patient does not have concerns regarding medicines.  The following changes have been made:  As above  Labs/ tests ordered today include:   Orders Placed This Encounter  Procedures   Cardiac Stress Test: Informed Consent Details: Physician/Practitioner Attestation; Transcribe to consent form and obtain patient signature   MYOCARDIAL PERFUSION IMAGING   EKG 12-Lead     Disposition:   FU with Dr. Debara Pickett in 6 months  Signed, Abigail Butts, PA-C  12/23/2020 12:13 PM

## 2020-12-23 ENCOUNTER — Other Ambulatory Visit: Payer: Self-pay

## 2020-12-23 ENCOUNTER — Ambulatory Visit (INDEPENDENT_AMBULATORY_CARE_PROVIDER_SITE_OTHER): Payer: PPO | Admitting: Medical

## 2020-12-23 ENCOUNTER — Encounter: Payer: Self-pay | Admitting: Medical

## 2020-12-23 VITALS — BP 104/68 | HR 113 | Ht 67.5 in | Wt 287.0 lb

## 2020-12-23 DIAGNOSIS — Z01810 Encounter for preprocedural cardiovascular examination: Secondary | ICD-10-CM | POA: Diagnosis not present

## 2020-12-23 DIAGNOSIS — E782 Mixed hyperlipidemia: Secondary | ICD-10-CM

## 2020-12-23 DIAGNOSIS — I5032 Chronic diastolic (congestive) heart failure: Secondary | ICD-10-CM | POA: Diagnosis not present

## 2020-12-23 DIAGNOSIS — I10 Essential (primary) hypertension: Secondary | ICD-10-CM | POA: Diagnosis not present

## 2020-12-23 DIAGNOSIS — R600 Localized edema: Secondary | ICD-10-CM | POA: Diagnosis not present

## 2020-12-23 NOTE — Patient Instructions (Signed)
Medication Instructions:  No Changes *If you need a refill on your cardiac medications before your next appointment, please call your pharmacy*   Lab Work: No labs If you have labs (blood work) drawn today and your tests are completely normal, you will receive your results only by: [place] (if you have MyChart) OR A paper copy in the mail If you have any lab test that is abnormal or we need to change your treatment, we will call you to review the results.   Testing/Procedures: [address] Your physician has requested that you have a lexiscan myoview. For further information please visit [email or web address]. Please follow instruction sheet, as given.    Follow-Up: At [facility], you and your health needs are our priority.  As part of our continuing mission to provide you with exceptional heart care, we have created designated Provider Care Teams.  These Care Teams include your primary Cardiologist (physician) and Advanced Practice Providers (APPs -  Physician Assistants and Nurse Practitioners) who all work together to provide you with the care you need, when you need it.   Your next appointment:   6 month(s)  The format for your next appointment:   In Person  Provider:   Raliegh Ip Mali Hilty, MD   Other Instructions Consider Zip up Compression Stockings. Can look on [employer].

## 2020-12-31 ENCOUNTER — Inpatient Hospital Stay: Payer: PPO

## 2020-12-31 ENCOUNTER — Telehealth (HOSPITAL_COMMUNITY): Payer: Self-pay

## 2020-12-31 ENCOUNTER — Inpatient Hospital Stay: Payer: PPO | Attending: Oncology | Admitting: Oncology

## 2020-12-31 DIAGNOSIS — Z170 Estrogen receptor positive status [ER+]: Secondary | ICD-10-CM

## 2020-12-31 DIAGNOSIS — C50212 Malignant neoplasm of upper-inner quadrant of left female breast: Secondary | ICD-10-CM

## 2020-12-31 NOTE — Telephone Encounter (Signed)
Spoke with the patient, detailed instructions given. She stated that she would be here for her test. Asked to call back with any questions. S.Crecencio Kwiatek EMTP 

## 2020-12-31 NOTE — Progress Notes (Signed)
Brandi Stephens called to cancel today and we have rescheduled her appointment.

## 2021-01-01 ENCOUNTER — Telehealth: Payer: Self-pay | Admitting: Family Medicine

## 2021-01-01 NOTE — Telephone Encounter (Signed)
Informed patient of Dr' Lalonde's instructions. She states she can not take aleve but can take Tylenol. She will try tylenol and call back as needed

## 2021-01-01 NOTE — Telephone Encounter (Signed)
Gout flare started 3 days ago and she started colcrys  ( which she has had for 2 years)   on 2 Sunday and then 1 And 2 a day since Wednesday and she is not getting any better   She takes 300 mg allopurinol  daily  She states she can't come in because she has a walker

## 2021-01-05 ENCOUNTER — Telehealth: Payer: Self-pay | Admitting: Family Medicine

## 2021-01-05 ENCOUNTER — Ambulatory Visit (HOSPITAL_COMMUNITY): Payer: PPO

## 2021-01-05 NOTE — Progress Notes (Signed)
  Chronic Care Management   Outreach Note  01/05/2021 Name: Brandi Stephens MRN: [id number] DOB: February 07, 1946  Referred by: Girtha Rm, NP-C Reason for referral : No chief complaint on file.   An unsuccessful telephone outreach was attempted today. The patient was referred to the pharmacist for assistance with care management and care coordination.   Follow Up Plan:   [name] Dellinger Upstream Scheduler

## 2021-01-06 ENCOUNTER — Telehealth: Payer: Self-pay | Admitting: Family Medicine

## 2021-01-06 ENCOUNTER — Ambulatory Visit (HOSPITAL_COMMUNITY): Payer: PPO | Attending: Cardiovascular Disease

## 2021-01-06 ENCOUNTER — Other Ambulatory Visit: Payer: Self-pay

## 2021-01-06 DIAGNOSIS — R600 Localized edema: Secondary | ICD-10-CM | POA: Diagnosis not present

## 2021-01-06 DIAGNOSIS — E782 Mixed hyperlipidemia: Secondary | ICD-10-CM

## 2021-01-06 DIAGNOSIS — I5032 Chronic diastolic (congestive) heart failure: Secondary | ICD-10-CM | POA: Diagnosis not present

## 2021-01-06 DIAGNOSIS — I10 Essential (primary) hypertension: Secondary | ICD-10-CM

## 2021-01-06 MED ORDER — REGADENOSON 0.4 MG/5ML IV SOLN
0.4000 mg | Freq: Once | INTRAVENOUS | Status: AC
  Administered 2021-01-06: 0.4 mg via INTRAVENOUS

## 2021-01-06 MED ORDER — TECHNETIUM TC 99M TETROFOSMIN IV KIT
30.2000 | PACK | Freq: Once | INTRAVENOUS | Status: AC | PRN
  Administered 2021-01-06: 30.2 via INTRAVENOUS
  Filled 2021-01-06: qty 31

## 2021-01-06 NOTE — Progress Notes (Signed)
  Chronic Care Management   Note  01/06/2021 Name: Brandi Stephens MRN: [id number] DOB: 11-Jul-1945  Brandi Stephens is a 75 y.o. year old female who is a primary care patient of [name], Vickie L, NP-C. I reached out to [employer] by phone today in response to a referral sent by [name]'s PCP, Henson, Vickie L, NP-C.   [name] was given information about Chronic Care Management services today including:  CCM service includes personalized support from designated clinical staff supervised by her physician, including individualized plan of care and coordination with other care providers 24/7 contact phone numbers for assistance for urgent and routine care needs. Service will only be billed when office clinical staff spend 20 minutes or more in a month to coordinate care. Only one practitioner may furnish and bill the service in a calendar month. The patient may stop CCM services at any time (effective at the end of the month) by phone call to the office staff.   Patient agreed to services and verbal consent obtained.   Follow up plan:   [name] Secretary/administrator

## 2021-01-07 ENCOUNTER — Encounter (HOSPITAL_COMMUNITY): Payer: PPO | Attending: Medical

## 2021-01-07 ENCOUNTER — Other Ambulatory Visit: Payer: Self-pay

## 2021-01-07 LAB — MYOCARDIAL PERFUSION IMAGING
LV dias vol: 105 mL (ref 46–106)
LV sys vol: 51 mL
Nuc Stress EF: 60 %
Peak HR: 137 {beats}/min
Rest HR: 113 {beats}/min
Rest Nuclear Isotope Dose: 30.8 mCi
SDS: 8
SRS: 6
SSS: 14
ST Depression (mm): 0 mm
Stress Nuclear Isotope Dose: 30.2 mCi
TID: 1.08

## 2021-01-07 MED ORDER — TECHNETIUM TC 99M TETROFOSMIN IV KIT
30.8000 | PACK | Freq: Once | INTRAVENOUS | Status: AC | PRN
  Administered 2021-01-07: 30.8 via INTRAVENOUS
  Filled 2021-01-07: qty 31

## 2021-01-07 MED ORDER — ALLOPURINOL 300 MG PO TABS: ORAL_TABLET | ORAL | 0 refills | Status: DC

## 2021-01-08 ENCOUNTER — Other Ambulatory Visit: Payer: Self-pay | Admitting: Family Medicine

## 2021-01-08 NOTE — Telephone Encounter (Signed)
Received request for a refill on cymbalta and atorvastatin pt. Ast apt was 10/14/20 and next apt is 01/14/21.

## 2021-01-12 ENCOUNTER — Telehealth: Payer: Self-pay | Admitting: Pharmacist

## 2021-01-12 NOTE — Chronic Care Management (AMB) (Signed)
Chronic Care Management Pharmacy Assistant   Name: Brandi Stephens  MRN: [id number] DOB: 12/24/1945  Brandi Stephens is an 75 y.o. year old female who presents for her initial CCM visit with the clinical pharmacist.  Reason for Encounter: Chart Prep for initial visit on 01/14/21 with Jeni Salles the clinical pharmacist.   Conditions to be addressed/monitored: CHF, HTN, COPD, CKD Stage 3, Anxiety, Depression, and PVC'S, Chronic constipation, Hypothyroidism, Neuropathy, Lumbar radiculopathy, Osteopenia, Macrocytic anemia, Breast cancer of left breast, Prediabetes, Gout, Restless leg syndrome, Grade 1 diastolic dysfunction, Hypokalemia, Short - term memory loss and insomnia.   Recent office visits:  10/14/20 Rita Ohara MD (Family Medicine) - seen for low back pain. Discontinued hydrocodone with acetaminophen 5-325mg . Follow up with neurosurgeon if pain persists.  08/11/20 Harland Dingwall NP-C (Family Medicine) - seen for acute otitis media. Patient started on levofloxacin 500mg . Discontinued azithromycin 250mg . Follow up as needed.   08/07/20 Jill Alexanders MD (Family Medicine) - seen for dysfunction of left eustachian tube. No medication changes. Follow up as needed.   07/23/20 Harland Dingwall NP-C (Family Medicine) - video visit for productive cough. Patient started on azithromycin taper pak. Discontinued colchicine 0.6mg . follow up as needed.   Recent consult visits:  12/31/20 Lurline Del MD (Oncology) - seen for malignant neoplasm of upper inner quadrant of left breast. No medication changes. Canceled and rescheduled.   12/23/20 Roby Lofts PA-C (Cardiology) - seen for bilateral lower extremity edema and other issues. No medication changes. Follow up in 6 months.   12/17/20 Leeroy Cha MD (Physical Medicine and Rehab) - video visit for anxiety and other issues. Patient started on ergocalciferol 50000 units weekly. Return for Qutenza 4 patches for peripheral neuropathy.   11/05/20  Ruta Hinds MD (Vascular Surgery) - seen for pain and swelling of left lower extremity. No medication changes. Follow up as needed.   11/03/20 [name] MD (Infectious Disease) - seen for acute osteomyelitis of lumbar spine. No medication changes. Follow up in 3 months.   09/16/20 Roby Lofts PA-C (Cardiology) - seen for pain and swelling of lower extremity and other chronic conditions. Discontinued Nystatin 500000 units. Follow up in 3 months.   09/07/20 Jola Schmidt (Ophthalmology) - seen for other secondary cataract of left eye. Laser cataract removal. No medication changes or follow up noted.   09/02/20 Leeroy Cha MD (Physical Medicine and Rehab) - seen for bilateral lower extremity edema. Referral for physical therapy placed. Patient started on lidocaine patches to affected area. No follow up noted.   08/04/20 [name] MD (Infectious Disease) - seen for acute myelitis of lumbar spine. No medication changes. Follow up in 3 months.   Hospital visits:  None in previous 6 months  Medications: Outpatient Encounter Medications as of 01/12/2021  Medication Sig   albuterol (VENTOLIN HFA) 108 (90 Base) MCG/ACT inhaler INHALE 2 PUFFS BY MOUTH EVERY 4 TO 6 HOURS AS NEEDED   allopurinol (ZYLOPRIM) 300 MG tablet 1 daily   amitriptyline (ELAVIL) 75 MG tablet TAKE 1 TABLET(75 MG) BY MOUTH AT BEDTIME   anastrozole (ARIMIDEX) 1 MG tablet TAKE 1 TABLET(1 MG) BY MOUTH DAILY   atorvastatin (LIPITOR) 20 MG tablet TAKE 1 TABLET BY MOUTH EVERY DAY   betamethasone dipropionate 0.05 % cream Apply topically 2 (two) times daily.   Biotin 3 MG TABS Take by mouth.   cephALEXin (KEFLEX) 500 MG capsule TAKE 1 CAPSULE(500 MG) BY MOUTH THREE TIMES DAILY   Cholecalciferol (D3 ADULT PO) Take by  mouth.   diazepam (VALIUM) 5 MG tablet Take 1 tablet (5 mg total) by mouth daily as needed for anxiety.   diclofenac Sodium (VOLTAREN) 1 % GEL Apply 4 g topically 4 (four) times daily.   docusate sodium (COLACE)  100 MG capsule Take 1 capsule (100 mg total) by mouth 3 (three) times daily. (Patient taking differently: Take 100 mg by mouth 2 (two) times daily.)   DULoxetine (CYMBALTA) 60 MG capsule TAKE 1 CAPSULE(60 MG) BY MOUTH DAILY   fluorometholone (FML) 0.1 % ophthalmic suspension SMARTSIG:In Eye(s)   Fluticasone-Salmeterol (ADVAIR) 250-50 MCG/DOSE AEPB Inhale 1 puff into the lungs every 12 (twelve) hours.   furosemide (LASIX) 40 MG tablet Take 1 tablet (40 mg total) by mouth daily.   levothyroxine (SYNTHROID) 75 MCG tablet TAKE 1 TABLET(75 MCG) BY MOUTH DAILY BEFORE BREAKFAST   lidocaine (LIDODERM) 5 % UNWRAP AND APPLY 2 PATCHES TO SKIN DAILY, REMOVE AND DISCARD PATCH WITHIN 12 HOURS OR AS DIRECTED BY DOCTOR   magnesium gluconate (MAGONATE) 500 MG tablet Take 2 tablets (1,000 mg total) by mouth at bedtime.   metoprolol succinate (TOPROL-XL) 25 MG 24 hr tablet Take 1 tablet (25 mg total) by mouth daily.   Omega-3 Fatty Acids (OMEGA-3 FISH OIL PO) Take by mouth.   polyethylene glycol powder (GLYCOLAX/MIRALAX) 17 GM/SCOOP powder Take 17 g by mouth daily as needed for moderate constipation.    primidone (MYSOLINE) 50 MG tablet TAKE 1 TABLET BY MOUTH TWICE DAILY   Probiotic Product (PROBIOTIC DAILY PO) Take by mouth.   QUEtiapine (SEROQUEL) 25 MG tablet TAKE 1 TABLET(25 MG) BY MOUTH AT BEDTIME   rOPINIRole (REQUIP) 0.5 MG tablet TAKE 1 AND 1/2 TABLETS(0.75 MG) BY MOUTH AT BEDTIME   vitamin B-12 (CYANOCOBALAMIN) 1000 MCG tablet Take 1 tablet (1,000 mcg total) by mouth daily.   Vitamin D, Ergocalciferol, 50000 units CAPS Take 1 tablet by mouth once a week.   No facility-administered encounter medications on file as of 01/12/2021.   Fill History: ALLOPURINOL 300MG  TABLETS 10/09/2020 90   AMITRIPTYLINE 75MG  TABLETS 12/02/2020 90   ANASTROZOLE 1MG  TABLETS 11/08/2020 90   ATORVASTATIN CALCIUM 20MG  TABLET 01/08/2021 90   DULOXETINE HYDROCHLORIDE 60MG  CAPSULE DELAYED RELEASE PARTICLES 01/08/2021 90    DICLOFENAC 1% GEL 100GM 12/02/2020 21   VITAMIN D2 50000IU (ERGO) CAP RX 12/17/2020 49   FUROSEMIDE 40MG  TABLETS 09/21/2020 90   LEVOTHYROXINE 0.075MG  (75MCG) TABS 01/02/2021 90   METOPROLOL ER SUCCINATE 25MG  TABS 11/02/2020 90   PRIMIDONE 50MG  TABLETS 12/09/2020 90   QUETIAPINE 25MG  TABLETS 12/09/2020 90   DIAZEPAM 5MG  TABLETS 12/17/2020 90   ROPINIROLE HCL 0.5MG  TABLET 01/07/2021 30   Initial questions: Have you seen any other providers since your last visit?   Any changes in your medications or health?   Any side effects from any medications?   Do you have an symptoms or problems not managed by your medications?   Any concerns about your health right now?   Has your provider asked that you check blood pressure, blood sugar, or follow special diet at home?   Do you get any type of exercise on a regular basis?   Can you think of a goal you would like to reach for your health?   Do you have any problems getting your medications?   Is there anything that you would like to discuss during the appointment?   Please bring medications and supplements to appointment.  Patient called and canceled appointment. Care Gaps:  AWV -  Covid-19 vaccine booster 3 - overdue since 11/19/19 Flu vaccine - due Colonoscopy - due  Star Rating Drugs:  Atorvastatin 20mg  - last filled on 01/08/21 90DS at [facility] [phone]

## 2021-01-14 ENCOUNTER — Telehealth: Payer: PPO

## 2021-01-15 ENCOUNTER — Ambulatory Visit: Payer: PPO | Admitting: Family Medicine

## 2021-01-18 ENCOUNTER — Other Ambulatory Visit: Payer: Self-pay

## 2021-01-18 ENCOUNTER — Inpatient Hospital Stay (HOSPITAL_COMMUNITY)
Admission: EM | Admit: 2021-01-18 | Discharge: 2021-01-26 | DRG: 291 | Disposition: A | Discharge status: Home Health | Payer: PPO | Attending: Internal Medicine | Admitting: Internal Medicine

## 2021-01-18 ENCOUNTER — Encounter: Payer: Self-pay | Admitting: Family Medicine

## 2021-01-18 ENCOUNTER — Emergency Department (HOSPITAL_COMMUNITY): Payer: PPO

## 2021-01-18 ENCOUNTER — Ambulatory Visit (INDEPENDENT_AMBULATORY_CARE_PROVIDER_SITE_OTHER): Payer: PPO | Admitting: Family Medicine

## 2021-01-18 ENCOUNTER — Encounter (HOSPITAL_COMMUNITY): Payer: Self-pay | Admitting: Emergency Medicine

## 2021-01-18 VITALS — BP 118/68 | HR 114 | Temp 98.3°F | Ht 67.0 in | Wt 282.2 lb

## 2021-01-18 DIAGNOSIS — G2581 Restless legs syndrome: Secondary | ICD-10-CM | POA: Diagnosis not present

## 2021-01-18 DIAGNOSIS — F411 Generalized anxiety disorder: Secondary | ICD-10-CM | POA: Diagnosis present

## 2021-01-18 DIAGNOSIS — E1165 Type 2 diabetes mellitus with hyperglycemia: Secondary | ICD-10-CM | POA: Diagnosis not present

## 2021-01-18 DIAGNOSIS — I130 Hypertensive heart and chronic kidney disease with heart failure and stage 1 through stage 4 chronic kidney disease, or unspecified chronic kidney disease: Secondary | ICD-10-CM | POA: Diagnosis not present

## 2021-01-18 DIAGNOSIS — Z90710 Acquired absence of both cervix and uterus: Secondary | ICD-10-CM

## 2021-01-18 DIAGNOSIS — Z803 Family history of malignant neoplasm of breast: Secondary | ICD-10-CM

## 2021-01-18 DIAGNOSIS — I5032 Chronic diastolic (congestive) heart failure: Secondary | ICD-10-CM | POA: Diagnosis not present

## 2021-01-18 DIAGNOSIS — R000 Tachycardia, unspecified: Secondary | ICD-10-CM

## 2021-01-18 DIAGNOSIS — R600 Localized edema: Secondary | ICD-10-CM

## 2021-01-18 DIAGNOSIS — N1831 Chronic kidney disease, stage 3a: Secondary | ICD-10-CM | POA: Diagnosis present

## 2021-01-18 DIAGNOSIS — Z170 Estrogen receptor positive status [ER+]: Secondary | ICD-10-CM

## 2021-01-18 DIAGNOSIS — I5033 Acute on chronic diastolic (congestive) heart failure: Secondary | ICD-10-CM | POA: Diagnosis present

## 2021-01-18 DIAGNOSIS — I10 Essential (primary) hypertension: Secondary | ICD-10-CM | POA: Diagnosis not present

## 2021-01-18 DIAGNOSIS — Z923 Personal history of irradiation: Secondary | ICD-10-CM

## 2021-01-18 DIAGNOSIS — D72828 Other elevated white blood cell count: Secondary | ICD-10-CM | POA: Diagnosis not present

## 2021-01-18 DIAGNOSIS — N179 Acute kidney failure, unspecified: Secondary | ICD-10-CM | POA: Diagnosis present

## 2021-01-18 DIAGNOSIS — R0602 Shortness of breath: Secondary | ICD-10-CM | POA: Diagnosis not present

## 2021-01-18 DIAGNOSIS — Z823 Family history of stroke: Secondary | ICD-10-CM

## 2021-01-18 DIAGNOSIS — E039 Hypothyroidism, unspecified: Secondary | ICD-10-CM | POA: Diagnosis not present

## 2021-01-18 DIAGNOSIS — I77810 Thoracic aortic ectasia: Secondary | ICD-10-CM | POA: Diagnosis not present

## 2021-01-18 DIAGNOSIS — M109 Gout, unspecified: Secondary | ICD-10-CM | POA: Diagnosis present

## 2021-01-18 DIAGNOSIS — I959 Hypotension, unspecified: Secondary | ICD-10-CM | POA: Diagnosis not present

## 2021-01-18 DIAGNOSIS — F32A Depression, unspecified: Secondary | ICD-10-CM | POA: Diagnosis present

## 2021-01-18 DIAGNOSIS — Z87891 Personal history of nicotine dependence: Secondary | ICD-10-CM

## 2021-01-18 DIAGNOSIS — Z85828 Personal history of other malignant neoplasm of skin: Secondary | ICD-10-CM | POA: Diagnosis not present

## 2021-01-18 DIAGNOSIS — J439 Emphysema, unspecified: Secondary | ICD-10-CM | POA: Diagnosis not present

## 2021-01-18 DIAGNOSIS — J9601 Acute respiratory failure with hypoxia: Secondary | ICD-10-CM | POA: Diagnosis present

## 2021-01-18 DIAGNOSIS — Z79899 Other long term (current) drug therapy: Secondary | ICD-10-CM

## 2021-01-18 DIAGNOSIS — R0902 Hypoxemia: Secondary | ICD-10-CM

## 2021-01-18 DIAGNOSIS — I517 Cardiomegaly: Secondary | ICD-10-CM | POA: Diagnosis not present

## 2021-01-18 DIAGNOSIS — E1122 Type 2 diabetes mellitus with diabetic chronic kidney disease: Secondary | ICD-10-CM | POA: Diagnosis not present

## 2021-01-18 DIAGNOSIS — K219 Gastro-esophageal reflux disease without esophagitis: Secondary | ICD-10-CM | POA: Diagnosis present

## 2021-01-18 DIAGNOSIS — Z801 Family history of malignant neoplasm of trachea, bronchus and lung: Secondary | ICD-10-CM

## 2021-01-18 DIAGNOSIS — R29898 Other symptoms and signs involving the musculoskeletal system: Secondary | ICD-10-CM | POA: Diagnosis not present

## 2021-01-18 DIAGNOSIS — N183 Chronic kidney disease, stage 3 unspecified: Secondary | ICD-10-CM | POA: Diagnosis present

## 2021-01-18 DIAGNOSIS — E782 Mixed hyperlipidemia: Secondary | ICD-10-CM | POA: Diagnosis present

## 2021-01-18 DIAGNOSIS — J9811 Atelectasis: Secondary | ICD-10-CM | POA: Diagnosis not present

## 2021-01-18 DIAGNOSIS — Z20822 Contact with and (suspected) exposure to covid-19: Secondary | ICD-10-CM | POA: Diagnosis present

## 2021-01-18 DIAGNOSIS — R7303 Prediabetes: Secondary | ICD-10-CM | POA: Diagnosis present

## 2021-01-18 DIAGNOSIS — Z79890 Hormone replacement therapy (postmenopausal): Secondary | ICD-10-CM

## 2021-01-18 DIAGNOSIS — R609 Edema, unspecified: Secondary | ICD-10-CM | POA: Diagnosis not present

## 2021-01-18 DIAGNOSIS — Z800 Family history of malignant neoplasm of digestive organs: Secondary | ICD-10-CM | POA: Diagnosis not present

## 2021-01-18 DIAGNOSIS — R2243 Localized swelling, mass and lump, lower limb, bilateral: Secondary | ICD-10-CM | POA: Diagnosis not present

## 2021-01-18 DIAGNOSIS — Z79811 Long term (current) use of aromatase inhibitors: Secondary | ICD-10-CM

## 2021-01-18 DIAGNOSIS — Z7951 Long term (current) use of inhaled steroids: Secondary | ICD-10-CM

## 2021-01-18 DIAGNOSIS — R61 Generalized hyperhidrosis: Secondary | ICD-10-CM | POA: Diagnosis not present

## 2021-01-18 DIAGNOSIS — Z808 Family history of malignant neoplasm of other organs or systems: Secondary | ICD-10-CM

## 2021-01-18 DIAGNOSIS — Z853 Personal history of malignant neoplasm of breast: Secondary | ICD-10-CM

## 2021-01-18 DIAGNOSIS — J431 Panlobular emphysema: Secondary | ICD-10-CM | POA: Diagnosis not present

## 2021-01-18 DIAGNOSIS — J449 Chronic obstructive pulmonary disease, unspecified: Secondary | ICD-10-CM | POA: Diagnosis present

## 2021-01-18 DIAGNOSIS — G8929 Other chronic pain: Secondary | ICD-10-CM | POA: Diagnosis present

## 2021-01-18 LAB — RESP PANEL BY RT-PCR (FLU A&B, COVID) ARPGX2
Influenza A by PCR: NEGATIVE
Influenza B by PCR: NEGATIVE
SARS Coronavirus 2 by RT PCR: NEGATIVE

## 2021-01-18 LAB — CBC WITH DIFFERENTIAL/PLATELET
Abs Immature Granulocytes: 0.2 10*3/uL — ABNORMAL HIGH (ref 0.00–0.07)
Basophils Absolute: 0.1 10*3/uL (ref 0.0–0.1)
Basophils Relative: 1 %
Eosinophils Absolute: 0.3 10*3/uL (ref 0.0–0.5)
Eosinophils Relative: 2 %
HCT: 42.6 % (ref 36.0–46.0)
Hemoglobin: 14 g/dL (ref 12.0–15.0)
Immature Granulocytes: 2 %
Lymphocytes Relative: 13 %
Lymphs Abs: 1.6 10*3/uL (ref 0.7–4.0)
MCH: 32.4 pg (ref 26.0–34.0)
MCHC: 32.9 g/dL (ref 30.0–36.0)
MCV: 98.6 fL (ref 80.0–100.0)
Monocytes Absolute: 0.7 10*3/uL (ref 0.1–1.0)
Monocytes Relative: 6 %
Neutro Abs: 9.9 10*3/uL — ABNORMAL HIGH (ref 1.7–7.7)
Neutrophils Relative %: 76 %
Platelets: 250 10*3/uL (ref 150–400)
RBC: 4.32 MIL/uL (ref 3.87–5.11)
RDW: 14.6 % (ref 11.5–15.5)
WBC: 12.8 10*3/uL — ABNORMAL HIGH (ref 4.0–10.5)
nRBC: 0 % (ref 0.0–0.2)

## 2021-01-18 LAB — BASIC METABOLIC PANEL
Anion gap: 12 (ref 5–15)
BUN: 25 mg/dL — ABNORMAL HIGH (ref 8–23)
CO2: 25 mmol/L (ref 22–32)
Calcium: 10 mg/dL (ref 8.9–10.3)
Chloride: 100 mmol/L (ref 98–111)
Creatinine, Ser: 1.3 mg/dL — ABNORMAL HIGH (ref 0.44–1.00)
GFR, Estimated: 43 mL/min — ABNORMAL LOW (ref >60)
Glucose, Bld: 170 mg/dL — ABNORMAL HIGH (ref 70–99)
Potassium: 4.9 mmol/L (ref 3.5–5.1)
Sodium: 137 mmol/L (ref 135–145)

## 2021-01-18 LAB — TROPONIN I (HIGH SENSITIVITY): Troponin I (High Sensitivity): 11 ng/L (ref <18)

## 2021-01-18 LAB — BRAIN NATRIURETIC PEPTIDE: B Natriuretic Peptide: 46.5 pg/mL (ref 0.0–100.0)

## 2021-01-18 LAB — D-DIMER, QUANTITATIVE: D-Dimer, Quant: 0.84 ug/mL-FEU — ABNORMAL HIGH (ref 0.00–0.50)

## 2021-01-18 MED ORDER — QUETIAPINE FUMARATE 50 MG PO TABS
25.0000 mg | ORAL_TABLET | Freq: Every day | ORAL | Status: DC
  Administered 2021-01-19 – 2021-01-25 (×8): 25 mg via ORAL
  Filled 2021-01-18 (×8): qty 1

## 2021-01-18 MED ORDER — ATORVASTATIN CALCIUM 10 MG PO TABS
20.0000 mg | ORAL_TABLET | Freq: Every day | ORAL | Status: DC
  Administered 2021-01-20 – 2021-01-26 (×7): 20 mg via ORAL
  Filled 2021-01-18 (×7): qty 2

## 2021-01-18 MED ORDER — MOMETASONE FURO-FORMOTEROL FUM 200-5 MCG/ACT IN AERO
2.0000 | INHALATION_SPRAY | Freq: Two times a day (BID) | RESPIRATORY_TRACT | Status: DC
  Administered 2021-01-19 – 2021-01-26 (×14): 2 via RESPIRATORY_TRACT
  Filled 2021-01-18: qty 8.8

## 2021-01-18 MED ORDER — HEPARIN BOLUS VIA INFUSION
7000.0000 [IU] | Freq: Once | INTRAVENOUS | Status: AC
  Administered 2021-01-18: 7000 [IU] via INTRAVENOUS
  Filled 2021-01-18: qty 7000

## 2021-01-18 MED ORDER — ROPINIROLE HCL 0.5 MG PO TABS
0.7500 mg | ORAL_TABLET | Freq: Every day | ORAL | Status: DC
  Administered 2021-01-19 – 2021-01-25 (×8): 0.75 mg via ORAL
  Filled 2021-01-18 (×9): qty 1

## 2021-01-18 MED ORDER — ACETAMINOPHEN 650 MG RE SUPP: 650.0000 mg | Freq: Four times a day (QID) | RECTAL | Status: DC | PRN

## 2021-01-18 MED ORDER — PRIMIDONE 50 MG PO TABS
50.0000 mg | ORAL_TABLET | Freq: Two times a day (BID) | ORAL | Status: DC
  Administered 2021-01-19 – 2021-01-26 (×15): 50 mg via ORAL
  Filled 2021-01-18 (×17): qty 1

## 2021-01-18 MED ORDER — ACETAMINOPHEN 325 MG PO TABS
650.0000 mg | ORAL_TABLET | Freq: Four times a day (QID) | ORAL | Status: DC | PRN
  Administered 2021-01-19 – 2021-01-22 (×4): 650 mg via ORAL
  Filled 2021-01-18 (×5): qty 2

## 2021-01-18 MED ORDER — ALLOPURINOL 300 MG PO TABS
300.0000 mg | ORAL_TABLET | Freq: Every day | ORAL | Status: DC
  Administered 2021-01-20 – 2021-01-26 (×7): 300 mg via ORAL
  Filled 2021-01-18 (×7): qty 1

## 2021-01-18 MED ORDER — ONDANSETRON HCL 4 MG/2ML IJ SOLN
4.0000 mg | Freq: Once | INTRAMUSCULAR | Status: DC
  Filled 2021-01-18: qty 2

## 2021-01-18 MED ORDER — ALBUTEROL SULFATE (2.5 MG/3ML) 0.083% IN NEBU: 2.5000 mg | INHALATION_SOLUTION | RESPIRATORY_TRACT | Status: DC | PRN

## 2021-01-18 MED ORDER — HEPARIN (PORCINE) 25000 UT/250ML-% IV SOLN
1600.0000 [IU]/h | INTRAVENOUS | Status: DC
  Administered 2021-01-18: 1600 [IU]/h via INTRAVENOUS
  Filled 2021-01-18 (×2): qty 250

## 2021-01-18 MED ORDER — AMITRIPTYLINE HCL 25 MG PO TABS
75.0000 mg | ORAL_TABLET | Freq: Every day | ORAL | Status: DC
  Administered 2021-01-19 – 2021-01-25 (×8): 75 mg via ORAL
  Filled 2021-01-18 (×3): qty 3
  Filled 2021-01-18: qty 1
  Filled 2021-01-18 (×6): qty 3

## 2021-01-18 MED ORDER — DIAZEPAM 5 MG PO TABS
5.0000 mg | ORAL_TABLET | Freq: Every day | ORAL | Status: DC | PRN
  Administered 2021-01-19: 5 mg via ORAL
  Filled 2021-01-18: qty 1

## 2021-01-18 MED ORDER — DULOXETINE HCL 60 MG PO CPEP
60.0000 mg | ORAL_CAPSULE | Freq: Every day | ORAL | Status: DC
  Administered 2021-01-20 – 2021-01-26 (×7): 60 mg via ORAL
  Filled 2021-01-18 (×8): qty 1

## 2021-01-18 MED ORDER — IOHEXOL 350 MG/ML SOLN
60.0000 mL | Freq: Once | INTRAVENOUS | Status: AC | PRN
  Administered 2021-01-18: 60 mL via INTRAVENOUS

## 2021-01-18 MED ORDER — LEVOTHYROXINE SODIUM 75 MCG PO TABS
75.0000 ug | ORAL_TABLET | Freq: Every day | ORAL | Status: DC
  Administered 2021-01-19 – 2021-01-26 (×8): 75 ug via ORAL
  Filled 2021-01-18 (×8): qty 1

## 2021-01-18 NOTE — ED Triage Notes (Signed)
Patient sent to [facility] from PCP for evaluation of possible PE. Patient states she was at a routine visit today and her SpO2 was 88% and HR was 120. Patient has no complaints. Patient arrives on 3L O2 [place]. Denies recent long trips but reports multiple back surgeries that cause her to be more sedentary due to pain.

## 2021-01-18 NOTE — Progress Notes (Signed)
ANTICOAGULATION CONSULT NOTE - Initial Consult  Pharmacy Consult for heparin  Indication: pulmonary embolus  Allergies  Allergen Reactions   Ampicillin Hives and Other (See Comments)    Severe reaction in February 2017 1.5 month to have hives to go away   Gabapentin Swelling    UNSPECIFIED REACTION    Penicillins Hives and Other (See Comments)    Severe reaction in February 2017 1.5 month to have hives to go away Has patient had a PCN reaction causing immediate rash, facial/tongue/throat swelling, SOB or lightheadedness with hypotension: #  #  #  YES  #  #  #  Has patient had a PCN reaction causing severe rash involving mucus membranes or skin necrosis: No Has patient had a PCN reaction that required hospitalization No Has patient had a PCN reaction occurring within the last 10 years: No    Codeine Nausea And Vomiting   Other Itching    UNSPECIFIED Analgesics    Patient Measurements:   Heparin Dosing Weight: 92.3  Vital Signs: Temp: 98.4 F (36.9 C) (10/03 1344) Temp Source: Oral (10/03 1344) BP: 100/60 (10/03 2133) Pulse Rate: 86 (10/03 2133)  Labs: Recent Labs    01/18/21 1348  HGB 14.0  HCT 42.6  PLT 250  CREATININE 1.30*  TROPONINIHS 11    Estimated Creatinine Clearance: 52.9 mL/min (A) (by C-G formula based on SCr of 1.3 mg/dL (H)).   Medical History: Past Medical History:  Diagnosis Date   AIN (anal intraepithelial neoplasia) anal canal    Anemia    with pregnancy   Anxiety    Arthritis    knees, lower back, knees   Chronic constipation    Chronic diastolic CHF (congestive heart failure) ([place]) 07/05/2019   CKD (chronic kidney disease)    Coarse tremors    Essential   COPD (chronic obstructive pulmonary disease) ([place])    2017 chest CT, history of   Degenerative lumbar spinal stenosis    Depression    Depression 07/05/2019   Drug dependency ([place])    Elevated PTHrP level 05/09/2019   ETOH abuse 07/05/2019   GERD (gastroesophageal reflux disease)     pt denies   Gout    Grade I diastolic dysfunction 89/38/1017   Noted ECHO   Hemorrhoids    History of adenomatous polyp of colon    History of basal cell carcinoma (BCC) excision 2015   nose   History of sepsis 05/14/2014   post lumbar surgery   History of shingles    x2   Hyperlipidemia    Hypertension    Hypothyroidism    Irregular heart beat    history of while undergoing radiation   Malignant neoplasm of upper-inner quadrant of left breast in female, estrogen receptor positive [facility]) oncologist-  dr Jana Hakim--  per lov in epic no recurrence   dx 07-13-2015  Left breast invasive ductal carcinoma, Stage IA, Grade 1 (TXN0),  09-16-2015  s/p  left breast lumpectomy with sln bx's,  completed radiation 11-18-2015,  started antiestrogen therapy 12-14-2015   Neuropathy    Left foot and back of left leg   Obesity (BMI 30-39.9) 07/05/2019   Personal history of radiation therapy    completed 11-18-2015  left breast   Pneumonia    PONV (postoperative nausea and vomiting)    Prediabetes    diet controlled, no med   Restless leg syndrome    Seasonal allergies    Seizure ([place]) 05/2015   due to sepsis, only one time  episode   Tremor    Wears partial dentures    lower     Medications:  (Not in a hospital admission)  Scheduled:   ondansetron (ZOFRAN) IV  4 mg Intravenous Once   Infusions:   Assessment: Patient presented with low oxygen saturation (80s) and tachycardia. CBC WNL with Hgb 14.0, HCT 42.6, and PLTs 250. No anticoagulation PTA.   Goal of Therapy:  Heparin level 0.3-0.7 units/ml Monitor platelets by anticoagulation protocol: Yes   Plan:  Give 7000 units bolus x 1 Start heparin infusion at 1600 units/hr Check anti-Xa level in 8 hours and daily while on heparin Continue to monitor H&H and platelets  Brandi Stephens 01/18/2021,9:52 PM

## 2021-01-18 NOTE — H&P (Signed)
History and Physical    BRAYLEE LAL GGE:[id number] DOB: [birth year] DOA: 01/18/2021  PCP: Girtha Rm, NP-C Patient coming from: PCPs office  Chief Complaint: Low oxygen saturation, tachycardia  HPI: Brandi Stephens is a 75 y.o. female with medical history significant of COPD, chronic diastolic CHF, CKD stage II-IIIa, depression, anxiety, gout, hypertension, hyperlipidemia, hypothyroidism, breast cancer in 2017 status post left breast lumpectomy/radiation/antiestrogen therapy, class III obesity (BMI 44.20), prediabetes, RLS. Seen by PCP today and found to be tachycardic and hypoxic with oxygen saturation 88% on room air.  She was placed on supplemental oxygen and was sent to the ED for further evaluation.  Satting in the mid to high 80s on room air at rest and requiring 4 L oxygen in the ED and tachycardic.  Labs notable for WBC 12.8.  Hemoglobin and platelet count normal.  Creatinine 1.3, no significant change from baseline.  D-dimer 0.84.  High-sensitivity troponin negative.  BNP normal.  COVID and influenza PCR negative.  CT angiogram chest showing ill-defined area of low-attenuation involving a lower lobe branch of the right pulmonary artery without definite evidence of intraluminal filling defects; small PE not excluded.  Radiologist recommending further evaluation with VQ scan. Patient was started on IV heparin.  Patient states she went to her primary care physician today for routine visit and was sent to the emergency room as her heart rate was high and oxygen saturation low.  She does endorse dyspnea on exertion, worse today.  No cough or fevers.  No chest pain.  Denies history of blood clots.  States her legs have been swollen since Christmas time last year and she takes Lasix.  She has previously had testing done to see if she had blood clots in her legs and no clots were seen.  She has been taking anastrozole since 2017 due to history of breast cancer but ran out of her prescription a  few days ago.  Denies recent travel or surgeries.  She stopped smoking cigarettes several years ago.  Reports limited mobility at baseline due to chronic back problems.  Review of Systems:  All systems reviewed and apart from history of presenting illness, are negative.  Past Medical History:  Diagnosis Date   AIN (anal intraepithelial neoplasia) anal canal    Anemia    with pregnancy   Anxiety    Arthritis    knees, lower back, knees   Chronic constipation    Chronic diastolic CHF (congestive heart failure) ([place]) 07/05/2019   CKD (chronic kidney disease)    Coarse tremors    Essential   COPD (chronic obstructive pulmonary disease) ([place])    2017 chest CT, history of   Degenerative lumbar spinal stenosis    Depression    Depression 07/05/2019   Drug dependency ([place])    Elevated PTHrP level 05/09/2019   ETOH abuse 07/05/2019   GERD (gastroesophageal reflux disease)    pt denies   Gout    Grade I diastolic dysfunction 46/50/3546   Noted ECHO   Hemorrhoids    History of adenomatous polyp of colon    History of basal cell carcinoma (BCC) excision 2015   nose   History of sepsis 05/14/2014   post lumbar surgery   History of shingles    x2   Hyperlipidemia    Hypertension    Hypothyroidism    Irregular heart beat    history of while undergoing radiation   Malignant neoplasm of upper-inner quadrant of left breast in female, estrogen  receptor positive [facility]) oncologist-  dr Jana Hakim--  per lov in epic no recurrence   dx 07-13-2015  Left breast invasive ductal carcinoma, Stage IA, Grade 1 (TXN0),  09-16-2015  s/p  left breast lumpectomy with sln bx's,  completed radiation 11-18-2015,  started antiestrogen therapy 12-14-2015   Neuropathy    Left foot and back of left leg   Obesity (BMI 30-39.9) 07/05/2019   Personal history of radiation therapy    completed 11-18-2015  left breast   Pneumonia    PONV (postoperative nausea and vomiting)    Prediabetes    diet controlled, no med    Restless leg syndrome    Seasonal allergies    Seizure ([place]) 05/2015   due to sepsis, only one time episode   Tremor    Wears partial dentures    lower     Past Surgical History:  Procedure Laterality Date   ABDOMINAL HYSTERECTOMY  1985   BASAL CELL CARCINOMA EXCISION  10/15   nose   BREAST BIOPSY Right 08/24/2015   BREAST BIOPSY Right 07/13/2015   BREAST BIOPSY Left 07/13/2015   BREAST EXCISIONAL BIOPSY Left 1972   BREAST LUMPECTOMY Left    BREAST LUMPECTOMY WITH RADIOACTIVE SEED AND SENTINEL LYMPH NODE BIOPSY Left 09/16/2015   Procedure: BREAST LUMPECTOMY WITH RADIOACTIVE SEED AND SENTINEL LYMPH NODE BIOPSY;  Surgeon: Stark Klein, MD;  Location: [place];  Service: General;  Laterality: Left;   BREAST REDUCTION SURGERY Bilateral 1998   CATARACT EXTRACTION W/ INTRAOCULAR LENS IMPLANT Left 2016   [place]   hallo   COLONOSCOPY  01/2013   polyps   EYE SURGERY Left 2016   HAND SURGERY Left 02-19-2002   dr Fredna Dow  @[facility]    repair collateral ligament/ MPJ left thumb   HARDWARE REMOVAL N/A 07/24/2019   Procedure: REVISION OF HARDWARE Lumbar two - Sacral one. Extention of Fusion to Lumbar one;  Surgeon: [name], MD;  Location: [place];  Service: Neurosurgery;  Laterality: N/A;   HEMORRHOID SURGERY  2008   HIGH RESOLUTION ANOSCOPY N/A 02/28/2018   Procedure: HIGH RESOLUTION ANOSCOPY WITH BIOPSY;  Surgeon: Leighton Ruff, MD;  Location: [place];  Service: General;  Laterality: N/A;   KNEE ARTHROSCOPY Right 2002   LUMBAR SPINE SURGERY  03-20-2015   dr Kathyrn Sheriff   fusion L4-5, L5-S1   MOUTH SURGERY  07/14/2018   2 infected teeth removed   MULTIPLE TOOTH EXTRACTIONS     with bone grafting lower bottom right and left   REDUCTION MAMMAPLASTY Bilateral    RIGHT COLECTOMY  09-10-2003   dr Margot Chimes @[facility]    multiple colon polyps (per path tubular adenoma's, hyperplastic , benign appendix, benign two lymph nodes   ROTATOR CUFF  REPAIR Left 04/2016   TONSILLECTOMY  1969   TUBAL LIGATION Bilateral 1982     reports that she quit smoking about 2 years ago. Her smoking use included cigarettes. She has a 17.50 pack-year smoking history. She has never used smokeless tobacco. She reports current alcohol use. She reports that she does not currently use drugs.  Allergies  Allergen Reactions   Ampicillin Hives and Other (See Comments)    Severe reaction in February 2017 1.5 month to have hives to go away   Gabapentin Swelling    UNSPECIFIED REACTION    Penicillins Hives and Other (See Comments)    Severe reaction in February 2017 1.5 month to have hives to go away Has patient had a  PCN reaction causing immediate rash, facial/tongue/throat swelling, SOB or lightheadedness with hypotension: #  #  #  YES  #  #  #  Has patient had a PCN reaction causing severe rash involving mucus membranes or skin necrosis: No Has patient had a PCN reaction that required hospitalization No Has patient had a PCN reaction occurring within the last 10 years: No    Codeine Nausea And Vomiting   Other Itching    UNSPECIFIED Analgesics    Family History  Problem Relation Age of Onset   Atrial fibrillation Mother    Ulcers Father    Breast cancer Maternal Aunt    Lung cancer Maternal Uncle    Stomach cancer Maternal Grandmother    Lung cancer Maternal Aunt    Brain cancer Maternal Aunt    Prostate cancer Maternal Grandfather    Stroke Paternal Grandmother    Tuberculosis Paternal Grandfather    Colon cancer Neg Hx    Esophageal cancer Neg Hx    Rectal cancer Neg Hx     Prior to Admission medications   Medication Sig Start Date End Date Taking? Authorizing Provider  albuterol (VENTOLIN HFA) 108 (90 Base) MCG/ACT inhaler INHALE 2 PUFFS BY MOUTH EVERY 4 TO 6 HOURS AS NEEDED Patient taking differently: Inhale 2 puffs into the lungs every 4 (four) hours as needed for wheezing. 08/09/19  Yes Angiulli, Lavon Paganini, PA-C  allopurinol  (ZYLOPRIM) 300 MG tablet 1 daily Patient taking differently: Take 300 mg by mouth daily. 1 daily 01/07/21  Yes Henson, Vickie L, NP-C  amitriptyline (ELAVIL) 75 MG tablet TAKE 1 TABLET(75 MG) BY MOUTH AT BEDTIME Patient taking differently: Take 75 mg by mouth at bedtime. 09/02/20  Yes Raulkar, Clide Deutscher, MD  anastrozole (ARIMIDEX) 1 MG tablet TAKE 1 TABLET(1 MG) BY MOUTH DAILY Patient taking differently: Take 1 mg by mouth daily. 01/27/20  Yes Causey, Charlestine Massed, NP  atorvastatin (LIPITOR) 20 MG tablet TAKE 1 TABLET BY MOUTH EVERY DAY Patient taking differently: Take 20 mg by mouth daily. 01/08/21  Yes Henson, Vickie L, NP-C  betamethasone dipropionate 0.05 % cream Apply 1 application topically daily as needed (irritated skin). 12/25/19  Yes [provider]  Biotin 3 MG TABS Take 3 mg by mouth daily.   Yes [provider]  cephALEXin (KEFLEX) 500 MG capsule TAKE 1 CAPSULE(500 MG) BY MOUTH THREE TIMES DAILY Patient taking differently: Take 500 mg by mouth 3 (three) times daily. 11/03/20  Yes [name], MD  Cholecalciferol (D3 ADULT PO) Take 1 capsule by mouth daily.   Yes [provider]  diazepam (VALIUM) 5 MG tablet Take 1 tablet (5 mg total) by mouth daily as needed for anxiety. 12/17/20  Yes Raulkar, Clide Deutscher, MD  diclofenac Sodium (VOLTAREN) 1 % GEL Apply 4 g topically 4 (four) times daily. 12/02/20  Yes [name], MD  docusate sodium (COLACE) 100 MG capsule Take 1 capsule (100 mg total) by mouth 3 (three) times daily. Patient taking differently: Take 100 mg by mouth 2 (two) times daily. 07/09/19  Yes Angiulli, Lavon Paganini, PA-C  DULoxetine (CYMBALTA) 60 MG capsule TAKE 1 CAPSULE(60 MG) BY MOUTH DAILY Patient taking differently: Take 60 mg by mouth daily. 01/08/21  Yes Henson, Vickie L, NP-C  Fluticasone-Salmeterol (ADVAIR) 250-50 MCG/DOSE AEPB Inhale 1 puff into the lungs every 12 (twelve) hours. 06/08/20  Yes Chesley Mires, MD  furosemide (LASIX) 40 MG tablet  Take 1 tablet (40 mg total) by mouth daily. 06/23/20  Yes Hilty, Nadean Corwin, MD  levothyroxine (SYNTHROID) 75 MCG tablet TAKE 1 TABLET(75 MCG) BY MOUTH DAILY BEFORE BREAKFAST Patient taking differently: Take 75 mcg by mouth daily before breakfast. 12/08/20  Yes Henson, Vickie L, NP-C  lidocaine (LIDODERM) 5 % UNWRAP AND APPLY 2 PATCHES TO SKIN DAILY, REMOVE AND DISCARD PATCH WITHIN 12 HOURS OR AS DIRECTED BY DOCTOR Patient taking differently: Place 1 patch onto the skin daily. 09/02/20  Yes Raulkar, Clide Deutscher, MD  magnesium gluconate (MAGONATE) 500 MG tablet Take 2 tablets (1,000 mg total) by mouth at bedtime. 08/09/19  Yes Angiulli, Lavon Paganini, PA-C  metoprolol succinate (TOPROL-XL) 25 MG 24 hr tablet Take 1 tablet (25 mg total) by mouth daily. 11/02/20  Yes Raulkar, Clide Deutscher, MD  Omega-3 Fatty Acids (OMEGA-3 FISH OIL PO) Take 1 capsule by mouth daily.   Yes [provider]  polyethylene glycol powder (GLYCOLAX/MIRALAX) 17 GM/SCOOP powder Take 17 g by mouth daily as needed for moderate constipation.    Yes [provider]  primidone (MYSOLINE) 50 MG tablet TAKE 1 TABLET BY MOUTH TWICE DAILY Patient taking differently: Take 50 mg by mouth 2 (two) times daily. 09/15/20  Yes Raulkar, Clide Deutscher, MD  Probiotic Product (PROBIOTIC DAILY PO) Take 1 tablet by mouth daily.   Yes [provider]  Propylene Glycol (SYSTANE BALANCE OP) Place 1 drop into both eyes 4 (four) times daily as needed (dry eyes).   Yes [provider]  QUEtiapine (SEROQUEL) 25 MG tablet TAKE 1 TABLET(25 MG) BY MOUTH AT BEDTIME Patient taking differently: Take 25 mg by mouth at bedtime. 09/02/20  Yes Raulkar, Clide Deutscher, MD  rOPINIRole (REQUIP) 0.5 MG tablet TAKE 1 AND 1/2 TABLETS(0.75 MG) BY MOUTH AT BEDTIME Patient taking differently: Take 0.75 mg by mouth at bedtime. 11/09/20  Yes Tysinger, Camelia Eng, PA-C  vitamin B-12 (CYANOCOBALAMIN) 1000 MCG tablet Take 1 tablet (1,000 mcg total) by mouth daily. 08/09/19  Yes  Angiulli, Lavon Paganini, PA-C  Vitamin D, Ergocalciferol, 50000 units CAPS Take 1 tablet by mouth once a week. Patient taking differently: Take 50,000 Units by mouth once a week. Sundays 12/17/20  Yes Izora Ribas, MD    Physical Exam: Vitals:   01/18/21 2230 01/18/21 2345 01/19/21 0000 01/19/21 0010  BP: (!) 125/57 (!) 99/49 125/71 (!) 111/59  Pulse: 80 82 93 86  Resp: 17 (!) 24 (!) 25 20  Temp:      TempSrc:      SpO2: 94% 92% 92% (!) 82%    Physical Exam Constitutional:      General: She is not in acute distress. HENT:     Head: Normocephalic and atraumatic.  Eyes:     Extraocular Movements: Extraocular movements intact.     Conjunctiva/sclera: Conjunctivae normal.  Cardiovascular:     Rate and Rhythm: Normal rate and regular rhythm.     Pulses: Normal pulses.  Pulmonary:     Effort: Pulmonary effort is normal. No respiratory distress.     Breath sounds: No wheezing.     Comments: Satting well on 2 L supplemental oxygen Abdominal:     General: Bowel sounds are normal. There is no distension.     Palpations: Abdomen is soft.     Tenderness: There is no abdominal tenderness.  Musculoskeletal:     Cervical back: Normal range of motion and neck supple.     Right lower leg: Edema present.     Left lower leg: Edema present.  Comments: LLE >>RLE edema  Skin:    General: Skin is warm and dry.  Neurological:     General: No focal deficit present.     Mental Status: She is alert and oriented to person, place, and time.     Labs on Admission: I have personally reviewed following labs and imaging studies  CBC: Recent Labs  Lab 01/18/21 1348  WBC 12.8*  NEUTROABS 9.9*  HGB 14.0  HCT 42.6  MCV 98.6  PLT 196   Basic Metabolic Panel: Recent Labs  Lab 01/18/21 1348  NA 137  K 4.9  CL 100  CO2 25  GLUCOSE 170*  BUN 25*  CREATININE 1.30*  CALCIUM 10.0   GFR: Estimated Creatinine Clearance: 52.9 mL/min (A) (by C-G formula based on SCr of 1.3 mg/dL  (H)). Liver Function Tests: No results for input(s): AST, ALT, ALKPHOS, BILITOT, PROT, ALBUMIN in the last 168 hours. No results for input(s): LIPASE, AMYLASE in the last 168 hours. No results for input(s): AMMONIA in the last 168 hours. Coagulation Profile: No results for input(s): INR, PROTIME in the last 168 hours. Cardiac Enzymes: No results for input(s): CKTOTAL, CKMB, CKMBINDEX, TROPONINI in the last 168 hours. BNP (last 3 results) No results for input(s): PROBNP in the last 8760 hours. HbA1C: No results for input(s): HGBA1C in the last 72 hours. CBG: No results for input(s): GLUCAP in the last 168 hours. Lipid Profile: No results for input(s): CHOL, HDL, LDLCALC, TRIG, CHOLHDL, LDLDIRECT in the last 72 hours. Thyroid Function Tests: No results for input(s): TSH, T4TOTAL, FREET4, T3FREE, THYROIDAB in the last 72 hours. Anemia Panel: No results for input(s): VITAMINB12, FOLATE, FERRITIN, TIBC, IRON, RETICCTPCT in the last 72 hours. Urine analysis:    Component Value Date/Time   COLORURINE AMBER (A) 07/04/2019 1300   APPEARANCEUR CLOUDY (A) 07/04/2019 1300   LABSPEC 1.026 07/04/2019 1300   LABSPEC 1.025 06/24/2019 1426   PHURINE 5.0 07/04/2019 1300   GLUCOSEU NEGATIVE 07/04/2019 1300   HGBUR NEGATIVE 07/04/2019 1300   BILIRUBINUR MODERATE (A) 07/04/2019 1300   BILIRUBINUR moderate (A) 06/24/2019 1426   BILIRUBINUR n 11/25/2015 1712   KETONESUR NEGATIVE 07/04/2019 1300   PROTEINUR NEGATIVE 07/04/2019 1300   UROBILINOGEN negative 11/25/2015 1712   NITRITE NEGATIVE 07/04/2019 1300   LEUKOCYTESUR MODERATE (A) 07/04/2019 1300    Radiological Exams on Admission: DG Chest 1 View  Result Date: 01/18/2021 CLINICAL DATA:  Shortness of breath. EXAM: CHEST  1 VIEW COMPARISON:  Chest x-ray 05/20/2020. FINDINGS: Heart is mildly enlarged. There is no pleural effusion or pneumothorax. There is some strandy interstitial opacities in the left lower lung. Surgical clips are seen in the  left axilla. No acute fractures are identified. IMPRESSION: 1. Minimal opacities in the left lower lung may represent atelectasis/scarring or mild infection. 2. Stable cardiomegaly. Electronically Signed   By: Ronney Asters M.D.   On: 01/18/2021 16:01   CT Angio Chest PE W and/or Wo Contrast  Result Date: 01/18/2021 CLINICAL DATA:  Decreased oxygen saturation. EXAM: CT ANGIOGRAPHY CHEST WITH CONTRAST TECHNIQUE: Multidetector CT imaging of the chest was performed using the standard protocol during bolus administration of intravenous contrast. Multiplanar CT image reconstructions and MIPs were obtained to evaluate the vascular anatomy. CONTRAST:  77mL OMNIPAQUE IOHEXOL 350 MG/ML SOLN COMPARISON:  May 22, 2020 FINDINGS: Cardiovascular: There is mild calcification of the aortic arch. The subsegmental pulmonary arteries are limited in evaluation secondary to areas of overlying artifact and suboptimal opacification with intravenous contrast. An ill-defined area  of low attenuation is seen involving a lower lobe branch of the right pulmonary artery without definite evidence of intraluminal filling defects. Normal heart size. No pericardial effusion. Mediastinum/Nodes: No enlarged mediastinal, hilar, or axillary lymph nodes. Thyroid gland, trachea, and esophagus demonstrate no significant findings. Lungs/Pleura: Mild atelectasis is seen within the bilateral lung bases. There is no evidence of a pleural effusion or pneumothorax. Upper Abdomen: No acute abnormality. Musculoskeletal: No chest wall abnormality. No acute or significant osseous findings. Review of the MIP images confirms the above findings. IMPRESSION: 1. Ill-defined area of low attenuation involving a lower lobe branch of the right pulmonary artery without definite evidence of intraluminal filling defects. A small amount of pulmonary embolism can not completely be excluded. Further evaluation with a nuclear medicine ventilation/perfusion scan is  recommended. 2. Mild bibasilar atelectasis. 3. Aortic atherosclerosis. Aortic Atherosclerosis (ICD10-I70.0). Electronically Signed   By: Virgina Norfolk M.D.   On: 01/18/2021 20:21    EKG: Independently reviewed. Sinus tachycardia, no significant change since prior tracing.   Assessment/Plan Principal Problem:   Acute hypoxemic respiratory failure (HCC) Active Problems:   Essential hypertension   COPD (chronic obstructive pulmonary disease) (HCC)   Prediabetes   CKD (chronic kidney disease), stage III   Acute hypoxemic respiratory failure secondary to possible acute PE Endorsing worsening dyspnea on exertion.  She was tachycardic on arrival to the ED.  Satting in the mid to high 80s on room air at rest, does not use home oxygen.  Currently satting well on 2 L supplemental oxygen and stable.  Age-adjusted D-dimer borderline elevated. CT angiogram chest showing ill-defined area of low-attenuation involving a lower lobe branch of the right pulmonary artery without definite evidence of intraluminal filling defects; small PE not excluded.  Does have unilateral lower extremity edema and risk factors for PE including class III obesity, limited mobility due to chronic back problems, and anastrozole use since 2017 due to history of breast cancer. -Continue IV heparin -VQ scan -Lower extremity Dopplers -Continuous pulse ox -Continue supplemental oxygen  COPD Stable.  No signs of acute exacerbation. -Dulera, albuterol prn  Chronic diastolic CHF BNP normal.  -Hold Lasix at this time due to soft blood pressure.  CKD stage II-IIIa Stable.  Creatinine 1.3, no significant change from baseline.  Mood disorder -Continue amitriptyline, duloxetine, quetiapine, Valium prn  Hypertension -Hold home metoprolol at this time due to soft blood pressure.  Gout -Continue allopurinol  Hyperlipidemia -Continue Lipitor  Hypothyroidism -Continue Synthroid -Check TSH  History of breast cancer Status  post left breast lumpectomy and radiation therapy in 2017 and taking anastrozole since then.  Followed by Dr. Jana Hakim, last office visit in July 2019 and was supposed to follow-up in a year. -Hold anastrozole at this time due to concern for possible VTE/may increase risk of blood clots.  She has already finished 5 years of therapy.  Will need close follow-up with oncology.  Prediabetes A1c 6.4 on 06/04/2020. -Repeat A1c  Mild leukocytosis Likely reactive.  Chest CT without signs of pneumonia.  Not endorsing any UTI symptoms.  Not febrile. -Repeat CBC in a.m.  DVT prophylaxis: Heparin Code Status: Patient wishes to be full code. Family Communication: No family available at this time. Disposition Plan: Status is: Observation  The patient remains OBS appropriate and will d/c before 2 midnights.  Dispo: The patient is from: Home              Anticipated d/c is to: Home  Patient currently is not medically stable to d/c.   Difficult to place patient No  Level of care: Level of care: Telemetry Cardiac  The medical decision making on this patient was of high complexity and the patient is at high risk for clinical deterioration, therefore this is a level 3 visit.  The medical decision making is of moderate complexity, therefore this is a level 2 visit.  Shela Leff MD [facility]  If 7PM-7AM, please contact night-coverage [email or web address]  01/19/2021, 12:22 AM

## 2021-01-18 NOTE — ED Provider Notes (Signed)
Emergency Medicine Provider Triage Evaluation Note  Brandi Stephens , a 75 y.o. female  was evaluated in triage.  Pt complains of low oxygen saturation and tachycardia.  Patient was at scheduled routine visit with PCP today.  Found to have fast heart rate and oxygen saturation into the 80s.  She has a history of chronic lower extremity edema since about last Christmas, worse in the left leg than the right.  This is at baseline.  She has had extensive work-up for this.  No current chest pain or shortness of breath.  PCP was worried about a PE. Patient denies risk factors for pulmonary embolism including: unilateral leg swelling, history of DVT/PE/other blood clots, recent immobilizations, recent surgery, recent travel (>4hr segment), hemoptysis.   Review of Systems  Positive: Leg swelling, chronic Negative: Chest pain  Physical Exam  There were no vitals taken for this visit. Gen:   Awake, no distress   Resp:  Normal effort  MSK:   Moves extremities without difficulty  Other:  3+ edema left calf, 1+ edema right calf  Medical Decision Making  Medically screening exam initiated at 1:42 PM.  Appropriate orders placed.  Brandi Stephens was informed that the remainder of the evaluation will be completed by another provider, this initial triage assessment does not replace that evaluation, and the importance of remaining in the ED until their evaluation is complete.     Carlisle Cater, PA-C 01/18/21 1344    Milton Ferguson, MD 01/23/21 1112

## 2021-01-18 NOTE — Progress Notes (Signed)
   Subjective:    Patient ID: ONYX EDGLEY, female    DOB: 1946-02-09, 75 y.o.   MRN: [id number]  Knee Pain   Chief Complaint  Patient presents with   Knee Pain    Bilateral knee pain left knee hurts more than the right.wants to try gastric bypass surgery and wants PCP opinion. States even on lasix she continues to be swollen   Here with complaints of left leg edema x 2-3 weeks. Oxygen level 83% upon arrival and she is visibly short of breath and tachypneic.  Used her albuterol inhaler before she left her home to come for her visit.   Denies fever, chills, chest pain, palpitations, N/V/D.   Review of Systems Pertinent positives and negatives in the history of present illness.     Objective:   Physical Exam Constitutional:      General: She is in acute distress.     Appearance: She is obese. She is diaphoretic.  Cardiovascular:     Rate and Rhythm: Regular rhythm. Tachycardia present.  Pulmonary:     Effort: Tachypnea, accessory muscle usage and respiratory distress present.     Breath sounds: Normal breath sounds.  Musculoskeletal:     Cervical back: Normal range of motion and neck supple.  Neurological:     Mental Status: She is alert and oriented to person, place, and time.   BP 118/68 (BP Location: Right Arm, Patient Position: Sitting)   Pulse (!) 114   Ht 5\' 7"  (1.702 m)   Wt 282 lb 3.2 oz (128 kg)   SpO2 (!) 81%   BMI 44.20 kg/m       Assessment & Plan:  Hypoxia  Tachycardia  Pulmonary emphysema, unspecified emphysema type (HCC)  Edema of left lower leg  Diaphoresis  Chronic diastolic CHF (congestive heart failure) ([place])  She has underlying COPD, CHF and did not follow up with her pulmonologist.  Oxygen level did not improve above 88% on RA and she remained tachyardic for the entire time I was with her. On 2 L [place] her oxygen level increased to 94%.  I am sending her to the ED vis EMS to rule out PE or other etiologies to explain her symptoms.

## 2021-01-18 NOTE — ED Provider Notes (Signed)
[facility] EMERGENCY DEPARTMENT Provider Note   CSN: [id number] Arrival date & time: 01/18/21  1245     History Chief Complaint  Patient presents with   Follow-up    Brandi Stephens is a 75 y.o. female.  Patient with history of COPD, CKD presents today with shortness of breath.  Was at her primary care today who noted new onset of tachycardia and O2 saturation in the 80s on room air, sent patient here for evaluation of same.  Patient is currently requiring 4 L of oxygen via nasal cannula, is not on oxygen at baseline and has never been.  Primary care was concerned for pulmonary embolism.  Patient has unilateral leg swelling, however this is baseline for her since December.  No recent travel, exogenous estrogen use, recent surgeries, history of DVT/PE/other blood clots, recent immobilizations, hemoptysis.  She is not anticoagulated.  Of note, patient has long history of smoking, stopped in 2020.  The history is provided by the patient. No language interpreter was used.      Past Medical History:  Diagnosis Date   AIN (anal intraepithelial neoplasia) anal canal    Anemia    with pregnancy   Anxiety    Arthritis    knees, lower back, knees   Chronic constipation    Chronic diastolic CHF (congestive heart failure) ([place]) 07/05/2019   CKD (chronic kidney disease)    Coarse tremors    Essential   COPD (chronic obstructive pulmonary disease) ([place])    2017 chest CT, history of   Degenerative lumbar spinal stenosis    Depression    Depression 07/05/2019   Drug dependency ([place])    Elevated PTHrP level 05/09/2019   ETOH abuse 07/05/2019   GERD (gastroesophageal reflux disease)    pt denies   Gout    Grade I diastolic dysfunction 01/75/1025   Noted ECHO   Hemorrhoids    History of adenomatous polyp of colon    History of basal cell carcinoma (BCC) excision 2015   nose   History of sepsis 05/14/2014   post lumbar surgery   History of shingles    x2    Hyperlipidemia    Hypertension    Hypothyroidism    Irregular heart beat    history of while undergoing radiation   Malignant neoplasm of upper-inner quadrant of left breast in female, estrogen receptor positive [facility]) oncologist-  dr Jana Hakim--  per lov in epic no recurrence   dx 07-13-2015  Left breast invasive ductal carcinoma, Stage IA, Grade 1 (TXN0),  09-16-2015  s/p  left breast lumpectomy with sln bx's,  completed radiation 11-18-2015,  started antiestrogen therapy 12-14-2015   Neuropathy    Left foot and back of left leg   Obesity (BMI 30-39.9) 07/05/2019   Personal history of radiation therapy    completed 11-18-2015  left breast   Pneumonia    PONV (postoperative nausea and vomiting)    Prediabetes    diet controlled, no med   Restless leg syndrome    Seasonal allergies    Seizure ([place]) 05/2015   due to sepsis, only one time episode   Tremor    Wears partial dentures    lower     Patient Active Problem List   Diagnosis Date Noted   Short-term memory loss 02/24/2020   Peripheral edema 02/24/2020   Osteopenia 01/22/2020   Aortic atherosclerosis ([place]) 01/22/2020   Wheezing 01/22/2020   Insomnia 10/29/2019   Recurrent falls 09/06/2019  Confusion, postoperative    Benign essential HTN    Post-operative pain    Chronic constipation    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/02/2019   Post op infection 07/11/2019   Muscle spasm    Hypokalemia    Status post lumbar spinal fusion 07/10/2019   Postoperative pain    Acute blood loss anemia    Labile blood pressure    Tobacco abuse    Hypoalbuminemia due to protein-calorie malnutrition ([place])    Anxiety 07/05/2019   Depression 07/05/2019   Chronic diastolic CHF (congestive heart failure) ([place]) 07/05/2019   Obesity (BMI 30-39.9) 07/05/2019   ETOH abuse 07/05/2019   Lumbar radiculopathy 06/28/2019   Spinal stenosis of lumbar region 06/28/2019   Acute on chronic anemia    Tremors of nervous system    AKI (acute kidney injury)  ([place])    Chronic pain syndrome    Acute renal failure superimposed on stage 3 chronic kidney disease ([place]) 06/25/2019   Acute osteomyelitis of lumbar spine ([place]) 06/25/2019   Weakness of left lower extremity    Abdominal pain 06/24/2019   Nausea 06/24/2019   Abnormal urine odor 06/24/2019   History of sepsis 06/24/2019   Recent major surgery 06/24/2019   History of diverticulosis 06/24/2019   Elevated PTHrP level 05/09/2019   Paresthesia 02/12/2019   Low back pain 02/12/2019   Neuropathy 12/20/2018   Lumbar spinal stenosis 06/29/2018   Preoperative clearance 06/04/2018   Abnormal CT of the chest 06/04/2018   Smoker 05/17/2018   Hypothyroidism 11/30/2017   Thoracic aortic atherosclerosis ([place]) 09/14/2016   Benign essential tremor 09/09/2016   RLS (restless legs syndrome) 09/09/2016   H/O adenomatous polyp of colon 07/06/2016   PVC's (premature ventricular contractions) 03/29/2016   Preoperative cardiovascular examination 03/29/2016   Grade I diastolic dysfunction 36/46/8032   Palpitations 01/11/2016   Shortness of breath 01/11/2016   Former cigarette smoker 01/11/2016   Leg edema 01/11/2016   Chronic cough 10/28/2015   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   Malignant neoplasm of upper-inner quadrant of left breast in female, estrogen receptor positive ([place]) 07/14/2015   Prediabetes 07/08/2015   CKD (chronic kidney disease), stage III 07/08/2015   Dependent edema 06/08/2015   Sleep disturbance 06/08/2015   Mixed hyperlipidemia 06/08/2015   Generalized anxiety disorder 06/08/2015   Essential hypertension 06/08/2015   COPD (chronic obstructive pulmonary disease) ([place]) 06/08/2015   Gout 06/08/2015   Macrocytic anemia    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[place])    Sepsis ([place]) 05/15/2015   Lumbar spondylosis 03/20/2015    Past Surgical History:  Procedure Laterality Date   ABDOMINAL HYSTERECTOMY  1985   BASAL CELL CARCINOMA EXCISION  10/15   nose   BREAST BIOPSY Right  08/24/2015   BREAST BIOPSY Right 07/13/2015   BREAST BIOPSY Left 07/13/2015   BREAST EXCISIONAL BIOPSY Left 1972   BREAST LUMPECTOMY Left    BREAST LUMPECTOMY WITH RADIOACTIVE SEED AND SENTINEL LYMPH NODE BIOPSY Left 09/16/2015   Procedure: BREAST LUMPECTOMY WITH RADIOACTIVE SEED AND SENTINEL LYMPH NODE BIOPSY;  Surgeon: Stark Klein, MD;  Location: [facility];  Service: General;  Laterality: Left;   BREAST REDUCTION SURGERY Bilateral 1998   CATARACT EXTRACTION W/ INTRAOCULAR LENS IMPLANT Left 2016   CERVICAL SPINE SURGERY  1995   hallo   COLONOSCOPY  01/2013   polyps   EYE SURGERY Left 2016   HAND SURGERY Left 02-19-2002   dr Fredna Dow  @[facility]    repair collateral ligament/ MPJ left thumb   HARDWARE REMOVAL  N/A 07/24/2019   Procedure: REVISION OF HARDWARE Lumbar two - Sacral one. Extention of Fusion to Lumbar one;  Surgeon: [name], MD;  Location: [facility];  Service: Neurosurgery;  Laterality: N/A;   HEMORRHOID SURGERY  2008   HIGH RESOLUTION ANOSCOPY N/A 02/28/2018   Procedure: HIGH RESOLUTION ANOSCOPY WITH BIOPSY;  Surgeon: Leighton Ruff, MD;  Location: [facility];  Service: General;  Laterality: N/A;   KNEE ARTHROSCOPY Right 2002   LUMBAR SPINE SURGERY  03-20-2015   dr Kathyrn Sheriff   fusion L4-5, L5-S1   MOUTH SURGERY  07/14/2018   2 infected teeth removed   MULTIPLE TOOTH EXTRACTIONS     with bone grafting lower bottom right and left   REDUCTION MAMMAPLASTY Bilateral    RIGHT COLECTOMY  09-10-2003   dr Margot Chimes @[facility]    multiple colon polyps (per path tubular adenoma's, hyperplastic , benign appendix, benign two lymph nodes   ROTATOR CUFF REPAIR Left 04/2016   TONSILLECTOMY  1969   TUBAL LIGATION Bilateral 1982     OB History   No obstetric history on file.     Family History  Problem Relation Age of Onset   Atrial fibrillation Mother    Ulcers Father    Breast cancer Maternal Aunt    Lung cancer Maternal Uncle    Stomach cancer Maternal  Grandmother    Lung cancer Maternal Aunt    Brain cancer Maternal Aunt    Prostate cancer Maternal Grandfather    Stroke Paternal Grandmother    Tuberculosis Paternal Grandfather    Colon cancer Neg Hx    Esophageal cancer Neg Hx    Rectal cancer Neg Hx     Social History   Tobacco Use   Smoking status: Former    Packs/day: 0.50    Years: 35.00    Pack years: 17.50    Types: Cigarettes    Quit date: 05/22/2018    Years since quitting: 2.6   Smokeless tobacco: Never  Vaping Use   Vaping Use: Never used  Substance Use Topics   Alcohol use: Yes    Comment: 1 glass of wine a night   Drug use: Not Currently    Comment: CBD tincture    Home Medications Prior to Admission medications   Medication Sig Start Date End Date Taking? Authorizing Provider  albuterol (VENTOLIN HFA) 108 (90 Base) MCG/ACT inhaler INHALE 2 PUFFS BY MOUTH EVERY 4 TO 6 HOURS AS NEEDED 08/09/19   Angiulli, Lavon Paganini, PA-C  allopurinol (ZYLOPRIM) 300 MG tablet 1 daily 01/07/21   Henson, Vickie L, NP-C  amitriptyline (ELAVIL) 75 MG tablet TAKE 1 TABLET(75 MG) BY MOUTH AT BEDTIME 09/02/20   Raulkar, Clide Deutscher, MD  anastrozole (ARIMIDEX) 1 MG tablet TAKE 1 TABLET(1 MG) BY MOUTH DAILY 01/27/20   Causey, Charlestine Massed, NP  atorvastatin (LIPITOR) 20 MG tablet TAKE 1 TABLET BY MOUTH EVERY DAY Patient not taking: Reported on 01/18/2021 01/08/21   Harland Dingwall L, NP-C  betamethasone dipropionate 0.05 % cream Apply topically 2 (two) times daily. 12/25/19   [provider]  Biotin 3 MG TABS Take by mouth.    [provider]  cephALEXin (KEFLEX) 500 MG capsule TAKE 1 CAPSULE(500 MG) BY MOUTH THREE TIMES DAILY 11/03/20   [name], MD  Cholecalciferol (D3 ADULT PO) Take by mouth.    [provider]  diazepam (VALIUM) 5 MG tablet Take 1 tablet (5 mg total) by mouth daily as needed for anxiety. 12/17/20   Raulkar,  Clide Deutscher, MD  diclofenac Sodium (VOLTAREN) 1 % GEL Apply 4 g topically 4 (four) times  daily. 12/02/20   [name], MD  docusate sodium (COLACE) 100 MG capsule Take 1 capsule (100 mg total) by mouth 3 (three) times daily. Patient taking differently: Take 100 mg by mouth 2 (two) times daily. 07/09/19   Angiulli, Lavon Paganini, PA-C  DULoxetine (CYMBALTA) 60 MG capsule TAKE 1 CAPSULE(60 MG) BY MOUTH DAILY 01/08/21   Henson, Vickie L, NP-C  Fluticasone-Salmeterol (ADVAIR) 250-50 MCG/DOSE AEPB Inhale 1 puff into the lungs every 12 (twelve) hours. 06/08/20   Chesley Mires, MD  furosemide (LASIX) 40 MG tablet Take 1 tablet (40 mg total) by mouth daily. 06/23/20   [name], MD  levothyroxine (SYNTHROID) 75 MCG tablet TAKE 1 TABLET(75 MCG) BY MOUTH DAILY BEFORE BREAKFAST 12/08/20   Henson, Vickie L, NP-C  lidocaine (LIDODERM) 5 % UNWRAP AND APPLY 2 PATCHES TO SKIN DAILY, REMOVE AND DISCARD PATCH WITHIN 12 HOURS OR AS DIRECTED BY DOCTOR 09/02/20   Raulkar, Clide Deutscher, MD  magnesium gluconate (MAGONATE) 500 MG tablet Take 2 tablets (1,000 mg total) by mouth at bedtime. 08/09/19   Angiulli, Lavon Paganini, PA-C  metoprolol succinate (TOPROL-XL) 25 MG 24 hr tablet Take 1 tablet (25 mg total) by mouth daily. 11/02/20   Raulkar, Clide Deutscher, MD  Omega-3 Fatty Acids (OMEGA-3 FISH OIL PO) Take by mouth. Patient not taking: Reported on 01/18/2021    [provider]  polyethylene glycol powder (GLYCOLAX/MIRALAX) 17 GM/SCOOP powder Take 17 g by mouth daily as needed for moderate constipation.     [provider]  primidone (MYSOLINE) 50 MG tablet TAKE 1 TABLET BY MOUTH TWICE DAILY 09/15/20   Raulkar, Clide Deutscher, MD  Probiotic Product (PROBIOTIC DAILY PO) Take by mouth.    [provider]  QUEtiapine (SEROQUEL) 25 MG tablet TAKE 1 TABLET(25 MG) BY MOUTH AT BEDTIME 09/02/20   Raulkar, Clide Deutscher, MD  rOPINIRole (REQUIP) 0.5 MG tablet TAKE 1 AND 1/2 TABLETS(0.75 MG) BY MOUTH AT BEDTIME 11/09/20   Tysinger, Camelia Eng, PA-C  vitamin B-12 (CYANOCOBALAMIN) 1000 MCG tablet Take 1 tablet (1,000 mcg  total) by mouth daily. 08/09/19   Angiulli, Lavon Paganini, PA-C  Vitamin D, Ergocalciferol, 50000 units CAPS Take 1 tablet by mouth once a week. 12/17/20   Izora Ribas, MD    Allergies    Ampicillin, Gabapentin, Penicillins, Codeine, and Other  Review of Systems   Review of Systems  Constitutional:  Negative for chills, diaphoresis, fatigue and fever.  HENT:  Negative for congestion, postnasal drip, rhinorrhea, sore throat, trouble swallowing and voice change.   Respiratory:  Positive for shortness of breath. Negative for cough, wheezing and stridor.   Cardiovascular:  Positive for palpitations. Negative for chest pain and leg swelling.  Gastrointestinal:  Negative for abdominal distention, abdominal pain, constipation, diarrhea, nausea and vomiting.  Skin:  Negative for color change and pallor.  Neurological:  Negative for dizziness, tremors, seizures, syncope, facial asymmetry, speech difficulty, weakness, light-headedness, numbness and headaches.  Psychiatric/Behavioral:  Negative for confusion and decreased concentration.   All other systems reviewed and are negative.  Physical Exam Updated Vital Signs BP 119/70   Pulse (!) 101   Temp 98.4 F (36.9 C) (Oral)   Resp 19   SpO2 99%   Physical Exam Vitals and nursing note reviewed.  Constitutional:      General: She is not in acute distress.    Appearance: Normal appearance. She  is normal weight. She is not ill-appearing, toxic-appearing or diaphoretic.  HENT:     Head: Normocephalic and atraumatic.  Cardiovascular:     Rate and Rhythm: Normal rate and regular rhythm.     Pulses: Normal pulses.     Heart sounds: Normal heart sounds.  Pulmonary:     Effort: Pulmonary effort is normal. No respiratory distress.     Breath sounds: Normal breath sounds. No stridor. No decreased breath sounds, wheezing, rhonchi or rales.     Comments: Lung sounds clear to auscultation in all fields.  Patient is maintaining well on 4 L of oxygen via  nasal cannula. Chest:     Chest wall: No tenderness.  Abdominal:     General: Abdomen is flat.     Palpations: Abdomen is soft.  Musculoskeletal:        General: Normal range of motion.     Cervical back: Normal range of motion and neck supple.  Skin:    General: Skin is warm and dry.  Neurological:     General: No focal deficit present.     Mental Status: She is alert.  Psychiatric:        Mood and Affect: Mood normal.        Behavior: Behavior normal.    ED Results / Procedures / Treatments   Labs (all labs ordered are listed, but only abnormal results are displayed) Labs Reviewed  CBC WITH DIFFERENTIAL/PLATELET - Abnormal; Notable for the following components:      Result Value   WBC 12.8 (*)    Neutro Abs 9.9 (*)    Abs Immature Granulocytes 0.20 (*)    All other components within normal limits  BASIC METABOLIC PANEL - Abnormal; Notable for the following components:   Glucose, Bld 170 (*)    BUN 25 (*)    Creatinine, Ser 1.30 (*)    GFR, Estimated 43 (*)    All other components within normal limits  D-DIMER, QUANTITATIVE - Abnormal; Notable for the following components:   D-Dimer, Quant 0.84 (*)    All other components within normal limits  BRAIN NATRIURETIC PEPTIDE  TROPONIN I (HIGH SENSITIVITY)    EKG None  Radiology DG Chest 1 View  Result Date: 01/18/2021 CLINICAL DATA:  Shortness of breath. EXAM: CHEST  1 VIEW COMPARISON:  Chest x-ray 05/20/2020. FINDINGS: Heart is mildly enlarged. There is no pleural effusion or pneumothorax. There is some strandy interstitial opacities in the left lower lung. Surgical clips are seen in the left axilla. No acute fractures are identified. IMPRESSION: 1. Minimal opacities in the left lower lung may represent atelectasis/scarring or mild infection. 2. Stable cardiomegaly. Electronically Signed   By: Ronney Asters M.D.   On: 01/18/2021 16:01    Procedures Procedures   Medications Ordered in ED Medications - No data to  display  ED Course  I have reviewed the triage vital signs and the nursing notes.  Pertinent labs & imaging results that were available during my care of the patient were reviewed by me and considered in my medical decision making (see chart for details).    MDM Rules/Calculators/A&P                         Patient presents today with acute onset shortness of breath, tachycardia, and new oxygen requirement.  She has never required oxygen, and is now on 4 L via nasal cannula.  Patient has some moderate leukocytosis at 12.8, no  anemia.  Chem panel at baseline, patient with history of CKD.  Troponin negative. BNP normal, no signs of heart failure exacerbation. D dimer elevated at 0.84, ordered CTPE for further evaluation of this.  CXR also showed minimal opacities in the left lower lung may represent atelectasis/scarring or mild infection, CTPE will also evaluate this further. CTPE pending at shift change.  Regardless of results from CTPE, patient will require admission for new oxygen requirement.  Depending upon results of CTPE, will trial reduction of oxygen therapy and reassess.  Care handoff to Dr. Roslynn Amble at shift change.   Final Clinical Impression(s) / ED Diagnoses Final diagnoses:  None    Rx / DC Orders ED Discharge Orders     None        Nestor Lewandowsky 01/18/21 2251    Milton Ferguson, MD 01/23/21 1112

## 2021-01-19 ENCOUNTER — Observation Stay (HOSPITAL_COMMUNITY): Payer: PPO

## 2021-01-19 ENCOUNTER — Observation Stay (HOSPITAL_BASED_OUTPATIENT_CLINIC_OR_DEPARTMENT_OTHER): Payer: PPO

## 2021-01-19 DIAGNOSIS — J9601 Acute respiratory failure with hypoxia: Secondary | ICD-10-CM | POA: Diagnosis not present

## 2021-01-19 DIAGNOSIS — R0602 Shortness of breath: Secondary | ICD-10-CM | POA: Diagnosis not present

## 2021-01-19 DIAGNOSIS — R2243 Localized swelling, mass and lump, lower limb, bilateral: Secondary | ICD-10-CM | POA: Diagnosis not present

## 2021-01-19 DIAGNOSIS — N1831 Chronic kidney disease, stage 3a: Secondary | ICD-10-CM | POA: Diagnosis not present

## 2021-01-19 DIAGNOSIS — I10 Essential (primary) hypertension: Secondary | ICD-10-CM | POA: Diagnosis not present

## 2021-01-19 DIAGNOSIS — J431 Panlobular emphysema: Secondary | ICD-10-CM | POA: Diagnosis not present

## 2021-01-19 DIAGNOSIS — R609 Edema, unspecified: Secondary | ICD-10-CM | POA: Diagnosis not present

## 2021-01-19 LAB — RENAL FUNCTION PANEL
Albumin: 3.7 g/dL (ref 3.5–5.0)
Anion gap: 9 (ref 5–15)
BUN: 29 mg/dL — ABNORMAL HIGH (ref 8–23)
CO2: 27 mmol/L (ref 22–32)
Calcium: 9.3 mg/dL (ref 8.9–10.3)
Chloride: 102 mmol/L (ref 98–111)
Creatinine, Ser: 1.12 mg/dL — ABNORMAL HIGH (ref 0.44–1.00)
GFR, Estimated: 52 mL/min — ABNORMAL LOW (ref >60)
Glucose, Bld: 194 mg/dL — ABNORMAL HIGH (ref 70–99)
Phosphorus: 4.3 mg/dL (ref 2.5–4.6)
Potassium: 4.5 mmol/L (ref 3.5–5.1)
Sodium: 138 mmol/L (ref 135–145)

## 2021-01-19 LAB — CBC
HCT: 37 % (ref 36.0–46.0)
Hemoglobin: 12.1 g/dL (ref 12.0–15.0)
MCH: 32.6 pg (ref 26.0–34.0)
MCHC: 32.7 g/dL (ref 30.0–36.0)
MCV: 99.7 fL (ref 80.0–100.0)
Platelets: 198 10*3/uL (ref 150–400)
RBC: 3.71 MIL/uL — ABNORMAL LOW (ref 3.87–5.11)
RDW: 14.7 % (ref 11.5–15.5)
WBC: 10.9 10*3/uL — ABNORMAL HIGH (ref 4.0–10.5)
nRBC: 0 % (ref 0.0–0.2)

## 2021-01-19 LAB — TSH: TSH: 4.495 u[IU]/mL (ref 0.350–4.500)

## 2021-01-19 LAB — HEMOGLOBIN A1C
Hgb A1c MFr Bld: 7 % — ABNORMAL HIGH (ref 4.8–5.6)
Mean Plasma Glucose: 154.2 mg/dL

## 2021-01-19 LAB — MAGNESIUM: Magnesium: 2.4 mg/dL (ref 1.7–2.4)

## 2021-01-19 LAB — HEPARIN LEVEL (UNFRACTIONATED): Heparin Unfractionated: 0.47 IU/mL (ref 0.30–0.70)

## 2021-01-19 MED ORDER — TECHNETIUM TO 99M ALBUMIN AGGREGATED
4.4000 | Freq: Once | INTRAVENOUS | Status: AC | PRN
  Administered 2021-01-19: 4.4 via INTRAVENOUS

## 2021-01-19 MED ORDER — ONDANSETRON HCL 4 MG/2ML IJ SOLN
4.0000 mg | Freq: Once | INTRAMUSCULAR | Status: AC | PRN
  Administered 2021-01-19: 4 mg via INTRAVENOUS
  Filled 2021-01-19: qty 2

## 2021-01-19 MED ORDER — ENOXAPARIN SODIUM 40 MG/0.4ML IJ SOSY
40.0000 mg | PREFILLED_SYRINGE | INTRAMUSCULAR | Status: DC
  Administered 2021-01-19 – 2021-01-25 (×7): 40 mg via SUBCUTANEOUS
  Filled 2021-01-19 (×7): qty 0.4

## 2021-01-19 MED ORDER — VITAMIN B-12 1000 MCG PO TABS
1000.0000 ug | ORAL_TABLET | Freq: Every day | ORAL | Status: DC
  Administered 2021-01-19 – 2021-01-26 (×8): 1000 ug via ORAL
  Filled 2021-01-19 (×7): qty 1

## 2021-01-19 MED ORDER — POLYVINYL ALCOHOL 1.4 % OP SOLN: 1.0000 [drp] | Freq: Four times a day (QID) | OPHTHALMIC | Status: DC | PRN

## 2021-01-19 MED ORDER — FUROSEMIDE 10 MG/ML IJ SOLN
40.0000 mg | Freq: Once | INTRAMUSCULAR | Status: AC
  Administered 2021-01-19: 40 mg via INTRAVENOUS
  Filled 2021-01-19: qty 4

## 2021-01-19 MED ORDER — ANASTROZOLE 1 MG PO TABS
1.0000 mg | ORAL_TABLET | Freq: Every day | ORAL | Status: DC
  Administered 2021-01-21 – 2021-01-26 (×6): 1 mg via ORAL
  Filled 2021-01-19 (×8): qty 1

## 2021-01-19 MED ORDER — METOPROLOL SUCCINATE ER 25 MG PO TB24
25.0000 mg | ORAL_TABLET | Freq: Every day | ORAL | Status: DC
  Administered 2021-01-19 – 2021-01-21 (×3): 25 mg via ORAL
  Filled 2021-01-19 (×3): qty 1

## 2021-01-19 NOTE — ED Notes (Signed)
Pt now on Room Air with oxygen saturations of 95%. Will cont to monitor.

## 2021-01-19 NOTE — Evaluation (Signed)
Occupational Therapy Evaluation Patient Details Name: Brandi Stephens MRN: [id number] DOB: 1945-12-14 Today's Date: 01/19/2021   History of Present Illness Brandi Stephens is a 75 y/o female admitted observation status 01/18/21 with tachycardic and hypoxic with oxygen saturation 88% on room air. CT angiogram chest showing ill-defined area of low-attenuation involving a lower lobe branch of the right pulmonary artery without definite evidence of intraluminal filling defects; small PE not excluded. 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 Reports limited mobility at baseline due to chronic back problems.   Clinical Impression   Pt is typically mod I for ADL and mobility with RW/tub bench. Pt today is overall min guard for ADL due to line management and potentially decreased safety awareness. No family present to confirm baseline cognition. Saturation qualification note entered, but Pt required 2L O2 to maintain SpO2 >90%. During mobility Pt did have jump of HR to 140 - but was primarily in the mid 120's during activity. Pt with episode of LUE pain/numbness along with LLE numbness - BP 156/77 and Pt stating "oh, this happens sometimes" smile symmetric, reported quick return to normal. Suspect that Pt is close to baseline - educated on energy conservation at this time, and HHOT post acute to maximize safety and independence in ADL and functional transfers and prevent further falls in home environment.       Recommendations for follow up therapy are one component of a multi-disciplinary discharge planning process, led by the attending physician.  Recommendations may be updated based on patient status, additional functional criteria and insurance authorization.   Follow Up  Recommendations  Home health OT;Supervision - Intermittent    Equipment Recommendations  None recommended by OT (Pt has appropriate DME)    Recommendations for Other Services PT consult     Precautions / Restrictions Precautions Precautions: Fall Precaution Comments: watch O2 Restrictions Weight Bearing Restrictions: No      Mobility Bed Mobility Overal bed mobility: Needs Assistance Bed Mobility: Supine to Sit;Sit to Supine     Supine to sit: Min guard Sit to supine: Min guard   General bed mobility comments: safety due to elevation of ED stretcher - no physical assist needed    Transfers Overall transfer level: Needs assistance Equipment used: Rolling walker (2 wheeled) Transfers: Sit to/from Stand Sit to Stand: Min guard         General transfer comment: only min guard due to line management    Balance Overall balance assessment: Mild deficits observed, not formally tested                                         ADL either performed or assessed with clinical judgement   ADL Overall ADL's : Needs assistance/impaired Eating/Feeding: Independent   Grooming: Min guard;Standing   Upper Body Bathing: Modified independent;Sitting Upper Body Bathing Details (indicate cue type and reason): uses tub bench and detachable shower head at home Lower Body Bathing: Modified independent;With adaptive equipment;Sitting/lateral leans   Upper Body Dressing : Minimal assistance;Sitting Upper Body Dressing Details (indicate cue type and reason): to don extra gown, assist due to lines Lower Body Dressing: Minimal assistance;Sit to/from stand   Toilet Transfer: Min guard;Ambulation;RW Toilet Transfer Details (indicate cue type and reason): cues to slow down  speed and focus on breathing through nose Toileting- Clothing Manipulation and Hygiene: Min guard;Sit to/from stand   Tub/ Banker: Tub transfer;Tub bench;Supervision/safety   Functional  mobility during ADLs: Min guard;Rolling walker General ADL Comments: decreased activity tolerance, energy conservation education provided verbally     Vision Patient Visual Report: No change from baseline       Perception     Praxis      Pertinent Vitals/Pain Pain Assessment: Faces Faces Pain Scale: Hurts little more Pain Location: back Pain Descriptors / Indicators: Aching Pain Intervention(s): Limited activity within patient's tolerance;Monitored during session;Repositioned     Hand Dominance Right   Extremity/Trunk Assessment Upper Extremity Assessment Upper Extremity Assessment: Overall WFL for tasks assessed   Lower Extremity Assessment Lower Extremity Assessment: Defer to PT evaluation   Cervical / Trunk Assessment Cervical / Trunk Assessment: Other exceptions Cervical / Trunk Exceptions: hx of back sx   Communication Communication Communication: No difficulties   Cognition Arousal/Alertness: Awake/alert Behavior During Therapy: WFL for tasks assessed/performed Overall Cognitive Status: No family/caregiver present to determine baseline cognitive functioning Area of Impairment: Attention;Memory;Safety/judgement                   Current Attention Level: Selective Memory: Decreased short-term memory   Safety/Judgement: Decreased awareness of safety     General Comments: Pt perseverating on fall where she damaged her front teeth, cues for body awareness - pt talking non-stop and unable to catch breath unless cued to focus on breathing   General Comments  walking saturation note entered, Pt required at least 2L O2 to maintain saturations at or above 90%.    Exercises     Shoulder Instructions      Home Living Family/patient expects to be discharged to:: Private residence Living Arrangements: Spouse/significant other Available Help at Discharge: Family Type of Home: House Home Access: Stairs to enter Technical brewer of Steps: 3 Entrance  Stairs-Rails: None Home Layout: Two level Alternate Level Stairs-Number of Steps: flight Alternate Level Stairs-Rails: Right;Left;Can reach both Bathroom Shower/Tub: Teacher, early years/pre: Standard     Home Equipment: Tub bench;Hand held Tourist information centre manager - 2 wheels;Walker - 4 wheels          Prior Functioning/Environment Level of Independence: Independent with assistive device(s)        Comments: Has been using RW for mobility since falling, had been going to OPPT - but had to stop due to edema issues. hopeful to return        OT Problem List: Decreased activity tolerance;Decreased safety awareness;Decreased knowledge of use of DME or AE;Cardiopulmonary status limiting activity;Obesity;Increased edema      OT Treatment/Interventions:      OT Goals(Current goals can be found in the care plan section) Acute Rehab OT Goals Patient Stated Goal: to get home and decreased edema OT Goal Formulation: With patient Time For Goal Achievement: 02/02/21 Potential to Achieve Goals: Good  OT Frequency:     Barriers to D/C:            Co-evaluation              AM-PAC OT "6 Clicks" Daily Activity     Outcome Measure Help from another person eating meals?: None Help from another person taking care of personal grooming?: A Little Help from another person toileting, which includes using toliet, bedpan, or urinal?: A Little Help from another person bathing (including washing, rinsing, drying)?: A Little Help from another person to put on and  taking off regular upper body clothing?: None Help from another person to put on and taking off regular lower body clothing?: A Little 6 Click Score: 20   End of Session Equipment Utilized During Treatment: Rolling walker;Gait belt;Oxygen (2L) Nurse Communication: Mobility status  Activity Tolerance: Patient tolerated treatment well Patient left: Other (comment) (walking in hallway with PT)  OT Visit Diagnosis: History of  falling (Z91.81)                Time: 1316-1330 OT Time Calculation (min): 14 min Charges:  OT General Charges $OT Visit: 1 Visit OT Evaluation $OT Eval Moderate Complexity: Lindsay OTR/L Acute Rehabilitation Services Pager: [phone] Office: [place] 01/19/2021, 2:04 PM

## 2021-01-19 NOTE — ED Notes (Signed)
Per PT, pt oxygen saturation at rest was 86%. Pt placed on 2L [place] with improvement to 94%.

## 2021-01-19 NOTE — ED Notes (Signed)
PT/OT in working with patient.

## 2021-01-19 NOTE — Evaluation (Signed)
Physical Therapy Evaluation Patient Details Name: Brandi Stephens MRN: [id number] DOB: [birth year] Today's Date: 01/19/2021  History of Present Illness  Brandi Stephens is a 75 y/o female admitted observation status 01/18/21 with tachycardic and hypoxic with oxygen saturation 88% on room air. CT angiogram chest showing ill-defined area of low-attenuation involving a lower lobe branch of the right pulmonary artery without definite evidence of intraluminal filling defects; small PE not excluded. 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 Reports limited mobility at baseline due to chronic back problems.  Clinical Impression  Pt admitted secondary to problem above with deficits below. Pt requiring min guard for safety and line management during mobility tasks. Required 2L of oxygen to maintain adequate oxygen sats. Cues for safety and energy conservation throughout. Educated about using rollator to assist with energy conservation. Pt with episode of LUE pain/numbness along with LLE numbness - BP 156/77 and Pt stating "oh, this happens sometimes" smile symmetric, reported quick return to normal. Feel pt would benefit from HHPT to address current deficits. Will continue to follow acutely.        Recommendations for follow up therapy are one component of a multi-disciplinary discharge planning process, led by the attending physician.  Recommendations may be updated based on patient status, additional functional criteria and insurance authorization.  Follow Up Recommendations Home health PT    Equipment Recommendations  None recommended by PT    Recommendations for Other Services       Precautions / Restrictions Precautions Precautions: Fall Precaution Comments: watch  O2 Restrictions Weight Bearing Restrictions: No      Mobility  Bed Mobility Overal bed mobility: Needs Assistance Bed Mobility: Supine to Sit;Sit to Supine     Supine to sit: Min guard Sit to supine: Min guard   General bed mobility comments: safety due to elevation of ED stretcher - no physical assist needed    Transfers Overall transfer level: Needs assistance Equipment used: Rolling walker (2 wheeled) Transfers: Sit to/from Stand Sit to Stand: Min guard         General transfer comment: only min guard due to line management  Ambulation/Gait Ambulation/Gait assistance: Min guard Gait Distance (Feet): 150 Feet Assistive device: Rolling walker (2 wheeled) Gait Pattern/deviations: Step-through pattern;Decreased stride length Gait velocity: WFL   General Gait Details: Pt requiring min guard for safety. Cues to slow gait speed for energy conservation.  Stairs            Wheelchair Mobility    Modified Rankin (Stroke Patients Only)       Balance Overall balance assessment: Mild deficits observed, not formally tested                                           Pertinent Vitals/Pain Pain Assessment: Faces Faces Pain Scale: Hurts little more Pain Location: back Pain Descriptors / Indicators: Aching Pain Intervention(s): Limited activity within patient's tolerance;Monitored during session;Repositioned    Home Living Family/patient expects to be discharged to:: Private residence Living Arrangements: Spouse/significant other Available Help at Discharge: Family Type of Home: House Home Access: Stairs to enter Entrance Stairs-Rails: None Entrance Stairs-Number of Steps: 3 Home Layout: Two level Home Equipment: Tub bench;Hand held shower head;Walker - 2 wheels;Walker - 4 wheels  Prior Function Level of Independence: Independent with assistive device(s)         Comments: Has been using RW for mobility since falling, had been  going to OPPT - but had to stop due to edema issues. hopeful to return     Hand Dominance   Dominant Hand: Right    Extremity/Trunk Assessment   Upper Extremity Assessment Upper Extremity Assessment: Defer to OT evaluation    Lower Extremity Assessment Lower Extremity Assessment: Generalized weakness    Cervical / Trunk Assessment Cervical / Trunk Assessment: Other exceptions Cervical / Trunk Exceptions: hx of back sx  Communication   Communication: No difficulties  Cognition Arousal/Alertness: Awake/alert Behavior During Therapy: WFL for tasks assessed/performed Overall Cognitive Status: No family/caregiver present to determine baseline cognitive functioning Area of Impairment: Attention;Memory;Safety/judgement                   Current Attention Level: Selective Memory: Decreased short-term memory   Safety/Judgement: Decreased awareness of safety     General Comments: Pt perseverating on fall where she damaged her front teeth, cues for body awareness - pt talking non-stop and unable to catch breath unless cued to focus on breathing      General Comments General comments (skin integrity, edema, etc.): Oxygen sats WFL on 2L oxygen    Exercises     Assessment/Plan    PT Assessment Patient needs continued PT services  PT Problem List Decreased balance;Decreased activity tolerance;Decreased mobility;Cardiopulmonary status limiting activity;Decreased knowledge of use of DME;Decreased knowledge of precautions       PT Treatment Interventions DME instruction;Gait training;Stair training;Functional mobility training;Therapeutic activities;Therapeutic exercise;Balance training;Patient/family education    PT Goals (Current goals can be found in the Care Plan section)  Acute Rehab PT Goals Patient Stated Goal: to get home and decreased edema PT Goal Formulation: With patient Time For Goal Achievement: 02/02/21 Potential to Achieve Goals: Good    Frequency Min  3X/week   Barriers to discharge        Co-evaluation               AM-PAC PT "6 Clicks" Mobility  Outcome Measure Help needed turning from your back to your side while in a flat bed without using bedrails?: None Help needed moving from lying on your back to sitting on the side of a flat bed without using bedrails?: A Little Help needed moving to and from a bed to a chair (including a wheelchair)?: A Little Help needed standing up from a chair using your arms (e.g., wheelchair or bedside chair)?: A Little Help needed to walk in hospital room?: A Little Help needed climbing 3-5 steps with a railing? : A Little 6 Click Score: 19    End of Session Equipment Utilized During Treatment: Gait belt;Oxygen Activity Tolerance: Patient limited by fatigue Patient left: in bed;with call bell/phone within reach (on stretcher in ED) Nurse Communication: Mobility status PT Visit Diagnosis: Other abnormalities of gait and mobility (R26.89)    Time: 7510-2585 PT Time Calculation (min) (ACUTE ONLY): 16 min   Charges:   PT Evaluation $PT Eval Low Complexity: 1 Low          Lou Miner, DPT  Acute Rehabilitation Services  Pager: [phone] Office: [phone]   Rudean Hitt 01/19/2021, 2:22 PM

## 2021-01-19 NOTE — Progress Notes (Signed)
SATURATION QUALIFICATIONS: (This note is used to comply with regulatory documentation for home oxygen)  Patient Saturations on Room Air at Rest = 84%  Patient Saturations on Room Air while Ambulating = 79%  Patient Saturations on 2 Liters of oxygen while Ambulating = 91%  Please briefly explain why patient needs home oxygen:  Pt cannot maintain SPO2>90% without supplemental O2, requires at least 2L at rest and with activity.  Jesse Sans OTR/L Acute Rehabilitation Services Pager: [phone] Office: [phone]

## 2021-01-19 NOTE — Progress Notes (Signed)
Lower extremity venous has been completed.   Preliminary results in CV Proc.   Brandi Stephens 01/19/2021 10:27 AM

## 2021-01-19 NOTE — Progress Notes (Signed)
ANTICOAGULATION CONSULT NOTE - Follow Up Consult  Pharmacy Consult for heparin  Indication: pulmonary embolus  Allergies  Allergen Reactions   Ampicillin Hives and Other (See Comments)    Severe reaction in February 2017 1.5 month to have hives to go away   Gabapentin Swelling    UNSPECIFIED REACTION    Penicillins Hives and Other (See Comments)    Severe reaction in February 2017 1.5 month to have hives to go away Has patient had a PCN reaction causing immediate rash, facial/tongue/throat swelling, SOB or lightheadedness with hypotension: #  #  #  YES  #  #  #  Has patient had a PCN reaction causing severe rash involving mucus membranes or skin necrosis: No Has patient had a PCN reaction that required hospitalization No Has patient had a PCN reaction occurring within the last 10 years: No    Codeine Nausea And Vomiting   Other Itching    UNSPECIFIED Analgesics    Patient Measurements:   Heparin Dosing Weight: 92.3  Vital Signs: BP: 106/68 (10/04 0620) Pulse Rate: 92 (10/04 0620)  Labs: Recent Labs    01/18/21 1348 01/19/21 0403 01/19/21 0700  HGB 14.0 12.1  --   HCT 42.6 37.0  --   PLT 250 198  --   HEPARINUNFRC  --   --  0.47  CREATININE 1.30*  --   --   TROPONINIHS 11  --   --      Estimated Creatinine Clearance: 52.9 mL/min (A) (by C-G formula based on SCr of 1.3 mg/dL (H)).   Medical History: Past Medical History:  Diagnosis Date   AIN (anal intraepithelial neoplasia) anal canal    Anemia    with pregnancy   Anxiety    Arthritis    knees, lower back, knees   Chronic constipation    Chronic diastolic CHF (congestive heart failure) ([place]) 07/05/2019   CKD (chronic kidney disease)    Coarse tremors    Essential   COPD (chronic obstructive pulmonary disease) ([place])    2017 chest CT, history of   Degenerative lumbar spinal stenosis    Depression    Depression 07/05/2019   Drug dependency ([place])    Elevated PTHrP level 05/09/2019   ETOH abuse 07/05/2019    GERD (gastroesophageal reflux disease)    pt denies   Gout    Grade I diastolic dysfunction 99/24/2683   Noted ECHO   Hemorrhoids    History of adenomatous polyp of colon    History of basal cell carcinoma (BCC) excision 2015   nose   History of sepsis 05/14/2014   post lumbar surgery   History of shingles    x2   Hyperlipidemia    Hypertension    Hypothyroidism    Irregular heart beat    history of while undergoing radiation   Malignant neoplasm of upper-inner quadrant of left breast in female, estrogen receptor positive [facility]) oncologist-  dr Jana Hakim--  per lov in epic no recurrence   dx 07-13-2015  Left breast invasive ductal carcinoma, Stage IA, Grade 1 (TXN0),  09-16-2015  s/p  left breast lumpectomy with sln bx's,  completed radiation 11-18-2015,  started antiestrogen therapy 12-14-2015   Neuropathy    Left foot and back of left leg   Obesity (BMI 30-39.9) 07/05/2019   Personal history of radiation therapy    completed 11-18-2015  left breast   Pneumonia    PONV (postoperative nausea and vomiting)    Prediabetes    diet  controlled, no med   Restless leg syndrome    Seasonal allergies    Seizure ([place]) 05/2015   due to sepsis, only one time episode   Tremor    Wears partial dentures    lower     Medications:  (Not in a hospital admission) Scheduled:   allopurinol  300 mg Oral Daily   amitriptyline  75 mg Oral QHS   atorvastatin  20 mg Oral Daily   DULoxetine  60 mg Oral Daily   levothyroxine  75 mcg Oral Q0600   mometasone-formoterol  2 puff Inhalation BID   primidone  50 mg Oral BID   QUEtiapine  25 mg Oral QHS   rOPINIRole  0.75 mg Oral QHS   Infusions:   heparin 1,600 Units/hr (01/19/21 4045)   Assessment: Patient presented with low oxygen saturation (80s) and tachycardia. CBC WNL with Hgb 14.0, HCT 42.6, and PLTs 250. No anticoagulation PTA.   1st HL 0.47; therapeutic, CBC wnl this AM.  Goal of Therapy:  Heparin level 0.3-0.7 units/ml Monitor  platelets by anticoagulation protocol: Yes   Plan: Continue heparin at 1600 units/hr F/u 8h confirmatory HL Monitor daily HL and CBC  Joetta Manners, PharmD, [facility] Clinical Pharmacist ED RPh Phone: [place]: [phone]

## 2021-01-19 NOTE — Progress Notes (Signed)
PROGRESS NOTE  LAVIDA PATCH VOH:[id number] DOB: 1946/03/18   PCP: Girtha Rm, NP-C  Patient is from: Home.  Lives with husband.  Uses rolling walker at baseline.  DOA: 01/18/2021 LOS: 0  Chief complaints:  Chief Complaint  Patient presents with   Follow-up     Brief Narrative / Interim history: 75 year old F with PMH of diastolic CHF, COPD not on O2, CKD-3A, HTN, HLD, hypothyroidism, morbid obesity, breast cancer s/p left breast lumpectomy, radiation and hormonal therapy, anxiety, depression and RLS sent to ED from PCP office with hypoxemia and tachycardia.  She went to see PCP for lower extremity edema.  She was admitted with working diagnosis of possible PE that could not be excluded by CTA chest.  She was started on IV heparin.  VQ scan and LE venous Doppler were ordered.  The next day, VQ scan and LE venous US negative for VTE.  Heparin discontinued.  She was ambulated and desaturated to 79% on RA.  Started on IV Lasix.    Subjective: Seen and examined earlier this morning and later this afternoon.  No major events overnight or this morning.  No major complaints other than swelling in her leg, mainly left leg and chronic back pain.  Denies dyspnea at rest but admits to dyspnea on exertion.  She denies chest pain, orthopnea, GI or UTI symptoms.  Objective: Vitals:   01/19/21 0315 01/19/21 0620 01/19/21 1000 01/19/21 1400  BP: (!) 141/74 106/68 121/67 (!) 159/126  Pulse: 93 92 97 (!) 105  Resp: 15 20 17    Temp:      TempSrc:      SpO2: 94% 100% 95% 93%    Intake/Output Summary (Last 24 hours) at 01/19/2021 1434 Last data filed at 01/19/2021 0907 Gross per 24 hour  Intake 216.95 ml  Output --  Net 216.95 ml   There were no vitals filed for this visit.  Examination:  GENERAL: No apparent distress.  Nontoxic. HEENT: MMM.  Vision and hearing grossly intact.  NECK: Supple.  No apparent JVD.  RESP: 93% on 2 L at rest.  No IWOB.  Fair aeration bilaterally. CVS:   RRR. Heart sounds normal.  ABD/GI/GU: BS+. Abd soft, NTND.  MSK/EXT:  Moves extremities. No apparent deformity.  Trace edema in RLE.  1+ edema in LLE. SKIN: no apparent skin lesion or wound NEURO: Awake, alert and oriented appropriately.  No apparent focal neuro deficit. PSYCH: Calm. Normal affect.    Procedures:  None  Microbiology summarized: [id number] and influenza PCR nonreactive.  Assessment & Plan: Acute respiratory failure with hypoxia-desaturated to 79% on RA with ambulation.  Not on oxygen at home.  Likely due to acute on chronic diastolic CHF in conjunction with underlying COPD.  She has progressive LE edema.  BNP within normal but not reliable given morbid obesity.  PE excluded by CTA chest and VQ scan.  TTE in 06/2020 with LVEF of 65 to 70%, G1 DD.  Still with edema, DOE and oxygen requirement. -Discontinue IV heparin. -IV Lasix 40 mg twice daily. -Monitor fluid status, renal functions and electrolytes. -Limited echocardiogram -Wean oxygen as able. -Incentive spirometry/OOB/PT/OT  Chronic COPD-not in exacerbation. -Continue Dulera instead of home Symbicort  PE ruled out-CTA chest achy vocal.  VQ scan low probability.  LE venous Doppler negative. -Discontinue IV heparin.  CKD-3A/azotemia Recent Labs    01/31/20 1040 02/18/20 1545 05/18/20 0000 06/04/20 1511 06/15/20 1037 01/18/21 1348  BUN 26 33* 33* 30* 30* 25*  CREATININE 1.10*  1.11* 1.09* 1.19* 1.21* 1.30*  -Check renal function  Mood disorder/chronic back pain/RLS: Stable. -amitriptyline, duloxetine, quetiapine, Valium PRN  History of gout -Continue home meds  Essential hypertension: BP slightly elevated. -Resume home metoprolol -IV Lasix as above  Hypothyroidism -Continue home Synthroid  History of breast cancer s/p left lumpectomy in 2017, radiation and hormonal therapy. -Continue home anastrozole  Physical deconditioning/ambulatory dysfunction: Reports using walker at  baseline. -PT/OT  Morbid obesity There is no height or weight on file to calculate BMI.         DVT prophylaxis:  enoxaparin (LOVENOX) injection 40 mg Start: 01/19/21 1445  Code Status: Full code Family Communication: Patient and/or RN. Available if any question.  Level of care: Telemetry Cardiac Status is: Observation  The patient will require care spanning > 2 midnights and should be moved to inpatient because: Ongoing diagnostic testing needed not appropriate for outpatient work up, IV treatments appropriate due to intensity of illness or inability to take PO, and Inpatient level of care appropriate due to severity of illness  Dispo: The patient is from: Home              Anticipated d/c is to: Home              Patient currently is not medically stable to d/c.   Difficult to place patient No       Consultants:  None   Sch Meds:  Scheduled Meds:  allopurinol  300 mg Oral Daily   amitriptyline  75 mg Oral QHS   anastrozole  1 mg Oral Daily   atorvastatin  20 mg Oral Daily   DULoxetine  60 mg Oral Daily   enoxaparin (LOVENOX) injection  40 mg Subcutaneous Q24H   levothyroxine  75 mcg Oral Q0600   metoprolol succinate  25 mg Oral Daily   mometasone-formoterol  2 puff Inhalation BID   primidone  50 mg Oral BID   QUEtiapine  25 mg Oral QHS   rOPINIRole  0.75 mg Oral QHS   vitamin B-12  1,000 mcg Oral Daily   Continuous Infusions: PRN Meds:.acetaminophen **OR** acetaminophen, albuterol, diazepam, Propylene Glycol  Antimicrobials: Anti-infectives (From admission, onward)    None        I have personally reviewed the following labs and images: CBC: Recent Labs  Lab 01/18/21 1348 01/19/21 0403  WBC 12.8* 10.9*  NEUTROABS 9.9*  --   HGB 14.0 12.1  HCT 42.6 37.0  MCV 98.6 99.7  PLT 250 198   BMP &GFR Recent Labs  Lab 01/18/21 1348  NA 137  K 4.9  CL 100  CO2 25  GLUCOSE 170*  BUN 25*  CREATININE 1.30*  CALCIUM 10.0   Estimated Creatinine  Clearance: 52.9 mL/min (A) (by C-G formula based on SCr of 1.3 mg/dL (H)). Liver & Pancreas: No results for input(s): AST, ALT, ALKPHOS, BILITOT, PROT, ALBUMIN in the last 168 hours. No results for input(s): LIPASE, AMYLASE in the last 168 hours. No results for input(s): AMMONIA in the last 168 hours. Diabetic: Recent Labs    01/19/21 0403  HGBA1C 7.0*   No results for input(s): GLUCAP in the last 168 hours. Cardiac Enzymes: No results for input(s): CKTOTAL, CKMB, CKMBINDEX, TROPONINI in the last 168 hours. No results for input(s): PROBNP in the last 8760 hours. Coagulation Profile: No results for input(s): INR, PROTIME in the last 168 hours. Thyroid Function Tests: Recent Labs    01/19/21 0403  TSH 4.495   Lipid Profile: No  results for input(s): CHOL, HDL, LDLCALC, TRIG, CHOLHDL, LDLDIRECT in the last 72 hours. Anemia Panel: No results for input(s): VITAMINB12, FOLATE, FERRITIN, TIBC, IRON, RETICCTPCT in the last 72 hours. Urine analysis:    Component Value Date/Time   COLORURINE AMBER (A) 07/04/2019 1300   APPEARANCEUR CLOUDY (A) 07/04/2019 1300   LABSPEC 1.026 07/04/2019 1300   LABSPEC 1.025 06/24/2019 1426   PHURINE 5.0 07/04/2019 1300   GLUCOSEU NEGATIVE 07/04/2019 1300   HGBUR NEGATIVE 07/04/2019 1300   BILIRUBINUR MODERATE (A) 07/04/2019 1300   BILIRUBINUR moderate (A) 06/24/2019 1426   BILIRUBINUR n 11/25/2015 1712   KETONESUR NEGATIVE 07/04/2019 1300   PROTEINUR NEGATIVE 07/04/2019 1300   UROBILINOGEN negative 11/25/2015 1712   NITRITE NEGATIVE 07/04/2019 1300   LEUKOCYTESUR MODERATE (A) 07/04/2019 1300   Sepsis Labs: Invalid input(s): PROCALCITONIN, [place]  Microbiology: Recent Results (from the past 240 hour(s))  Resp Panel by RT-PCR (Flu A&B, Covid) Nasopharyngeal Swab     Status: None   Collection Time: 01/18/21  8:46 PM   Specimen: Nasopharyngeal Swab; Nasopharyngeal(NP) swabs in vial transport medium  Result Value Ref Range Status   SARS  Coronavirus 2 by RT PCR NEGATIVE NEGATIVE Final    Comment: (NOTE) SARS-CoV-2 target nucleic acids are NOT DETECTED.  The SARS-CoV-2 RNA is generally detectable in upper respiratory specimens during the acute phase of infection. The lowest concentration of SARS-CoV-2 viral copies this assay can detect is 138 copies/mL. A negative result does not preclude SARS-Cov-2 infection and should not be used as the sole basis for treatment or other patient management decisions. A negative result may occur with  improper specimen collection/handling, submission of specimen other than nasopharyngeal swab, presence of viral mutation(s) within the areas targeted by this assay, and inadequate number of viral copies(<138 copies/mL). A negative result must be combined with clinical observations, patient history, and epidemiological information. The expected result is Negative.  Fact Sheet for Patients:  EntrepreneurPulse.com.au  Fact Sheet for Healthcare Providers:  IncredibleEmployment.be  This test is no t yet approved or cleared by the [place] FDA and  has been authorized for detection and/or diagnosis of SARS-CoV-2 by FDA under an Emergency Use Authorization (EUA). This EUA will remain  in effect (meaning this test can be used) for the duration of the COVID-19 declaration under Section 564(b)(1) of the Act, 21 U.S.C.section 360bbb-3(b)(1), unless the authorization is terminated  or revoked sooner.       Influenza A by PCR NEGATIVE NEGATIVE Final   Influenza B by PCR NEGATIVE NEGATIVE Final    Comment: (NOTE) The Xpert Xpress SARS-CoV-2/FLU/RSV plus assay is intended as an aid in the diagnosis of influenza from Nasopharyngeal swab specimens and should not be used as a sole basis for treatment. Nasal washings and aspirates are unacceptable for Xpert Xpress SARS-CoV-2/FLU/RSV testing.  Fact Sheet for  Patients: EntrepreneurPulse.com.au  Fact Sheet for Healthcare Providers: IncredibleEmployment.be  This test is not yet approved or cleared by the [place] FDA and has been authorized for detection and/or diagnosis of SARS-CoV-2 by FDA under an Emergency Use Authorization (EUA). This EUA will remain in effect (meaning this test can be used) for the duration of the COVID-19 declaration under Section 564(b)(1) of the Act, 21 U.S.C. section 360bbb-3(b)(1), unless the authorization is terminated or revoked.  Performed at [facility], [place] [address] [place], [place] [address]     Radiology Studies: DG Chest 1 View  Result Date: 01/18/2021 CLINICAL DATA:  Shortness of breath. EXAM: CHEST  1 VIEW COMPARISON:  Chest x-ray 05/20/2020. FINDINGS: Heart is mildly enlarged. There is no pleural effusion or pneumothorax. There is some strandy interstitial opacities in the left lower lung. Surgical clips are seen in the left axilla. No acute fractures are identified. IMPRESSION: 1. Minimal opacities in the left lower lung may represent atelectasis/scarring or mild infection. 2. Stable cardiomegaly. Electronically Signed   By: Ronney Asters M.D.   On: 01/18/2021 16:01   CT Angio Chest PE W and/or Wo Contrast  Result Date: 01/18/2021 CLINICAL DATA:  Decreased oxygen saturation. EXAM: CT ANGIOGRAPHY CHEST WITH CONTRAST TECHNIQUE: Multidetector CT imaging of the chest was performed using the standard protocol during bolus administration of intravenous contrast. Multiplanar CT image reconstructions and MIPs were obtained to evaluate the vascular anatomy. CONTRAST:  29mL OMNIPAQUE IOHEXOL 350 MG/ML SOLN COMPARISON:  May 22, 2020 FINDINGS: Cardiovascular: There is mild calcification of the aortic arch. The subsegmental pulmonary arteries are limited in evaluation secondary to areas of overlying artifact and suboptimal opacification with intravenous contrast. An  ill-defined area of low attenuation is seen involving a lower lobe branch of the right pulmonary artery without definite evidence of intraluminal filling defects. Normal heart size. No pericardial effusion. Mediastinum/Nodes: No enlarged mediastinal, hilar, or axillary lymph nodes. Thyroid gland, trachea, and esophagus demonstrate no significant findings. Lungs/Pleura: Mild atelectasis is seen within the bilateral lung bases. There is no evidence of a pleural effusion or pneumothorax. Upper Abdomen: No acute abnormality. Musculoskeletal: No chest wall abnormality. No acute or significant osseous findings. Review of the MIP images confirms the above findings. IMPRESSION: 1. Ill-defined area of low attenuation involving a lower lobe branch of the right pulmonary artery without definite evidence of intraluminal filling defects. A small amount of pulmonary embolism can not completely be excluded. Further evaluation with a nuclear medicine ventilation/perfusion scan is recommended. 2. Mild bibasilar atelectasis. 3. Aortic atherosclerosis. Aortic Atherosclerosis (ICD10-I70.0). Electronically Signed   By: Virgina Norfolk M.D.   On: 01/18/2021 20:21   NM Pulmonary Perfusion  Result Date: 01/19/2021 CLINICAL DATA:  Decreased oxygen saturation. Shortness of breath. Increased heart rate. Patient underwent a CTA chest yesterday, findings with a possible small embolus a segmental right lower lobe branch. EXAM: NUCLEAR MEDICINE PERFUSION LUNG SCAN TECHNIQUE: Perfusion images were obtained in multiple projections after intravenous injection of radiopharmaceutical. Ventilation scans intentionally deferred if perfusion scan and chest x-ray adequate for interpretation during COVID 19 epidemic. RADIOPHARMACEUTICALS:  4.4 mCi Tc-50m MAA IV COMPARISON:  CTA chest and chest radiographs from 01/18/2021. FINDINGS: There is nonsegmental patchy decreased perfusion to the middle and upper lungs bilaterally most consistent with emphysema  and/or shunting firm small airways disease. There are no convincing segmental defects to suggest pulmonary thromboembolism. IMPRESSION: 1. Low probability study for pulmonary thromboembolism. Areas of nonsegmental decreased perfusion predominate in upper lungs consistent with changes from emphysema and/or small airways disease. Electronically Signed   By: Lajean Manes M.D.   On: 01/19/2021 11:49   VAS [place] LOWER EXTREMITY VENOUS (DVT)  Result Date: 01/19/2021  Lower Venous DVT Study Patient Name:  Brandi Stephens  Date of Exam:   01/19/2021 Medical Rec #: [id number]         Accession #:    [id number] Date of Birth: [birth year]         Patient Gender: F Patient Age:   67 years Exam Location:  [facility] Procedure:      VAS [place] LOWER EXTREMITY VENOUS (DVT) Referring Phys: Wandra Feinstein RATHORE --------------------------------------------------------------------------------  Indications:  Swelling, and Edema.  Comparison Study: 05/12/20 prior Performing Technologist: Archie Patten RVS  Examination Guidelines: A complete evaluation includes B-mode imaging, spectral Doppler, color Doppler, and power Doppler as needed of all accessible portions of each vessel. Bilateral testing is considered an integral part of a complete examination. Limited examinations for reoccurring indications may be performed as noted. The reflux portion of the exam is performed with the patient in reverse Trendelenburg.  +---------+---------------+---------+-----------+----------+--------------+ RIGHT    CompressibilityPhasicitySpontaneityPropertiesThrombus Aging +---------+---------------+---------+-----------+----------+--------------+ CFV      Full           Yes      Yes                                 +---------+---------------+---------+-----------+----------+--------------+ SFJ      Full                                                        +---------+---------------+---------+-----------+----------+--------------+  FV Prox  Full                                                        +---------+---------------+---------+-----------+----------+--------------+ FV Mid   Full                                                        +---------+---------------+---------+-----------+----------+--------------+ FV DistalFull                                                        +---------+---------------+---------+-----------+----------+--------------+ PFV      Full                                                        +---------+---------------+---------+-----------+----------+--------------+ POP      Full           Yes      Yes                                 +---------+---------------+---------+-----------+----------+--------------+ PTV      Full                                                        +---------+---------------+---------+-----------+----------+--------------+ PERO     Full                                                        +---------+---------------+---------+-----------+----------+--------------+   +---------+---------------+---------+-----------+----------+--------------+  LEFT     CompressibilityPhasicitySpontaneityPropertiesThrombus Aging +---------+---------------+---------+-----------+----------+--------------+ CFV      Full           Yes      Yes                                 +---------+---------------+---------+-----------+----------+--------------+ SFJ      Full                                                        +---------+---------------+---------+-----------+----------+--------------+ FV Prox  Full                                                        +---------+---------------+---------+-----------+----------+--------------+ FV Mid   Full                                                        +---------+---------------+---------+-----------+----------+--------------+ FV DistalFull                                                         +---------+---------------+---------+-----------+----------+--------------+ PFV      Full                                                        +---------+---------------+---------+-----------+----------+--------------+ POP      Full           Yes      Yes                                 +---------+---------------+---------+-----------+----------+--------------+ PTV      Full                                                        +---------+---------------+---------+-----------+----------+--------------+ PERO     Full                                                        +---------+---------------+---------+-----------+----------+--------------+     Summary: BILATERAL: - No evidence of deep vein thrombosis seen in the lower extremities, bilaterally. -No evidence of popliteal cyst, bilaterally.   *See table(s) above for measurements and observations.    Preliminary       Gerod Caligiuri T. Lara Palinkas  [facility]  If 7PM-7AM, please contact night-coverage [email or web address] 01/19/2021, 2:34 PM

## 2021-01-19 NOTE — ED Notes (Signed)
Bed linens changed. Pt comfortable with call bell in reach.

## 2021-01-19 NOTE — ED Notes (Signed)
Pt oxygen decreased to 2L [place] per MD request.

## 2021-01-20 ENCOUNTER — Observation Stay (HOSPITAL_COMMUNITY): Payer: PPO

## 2021-01-20 DIAGNOSIS — J431 Panlobular emphysema: Secondary | ICD-10-CM | POA: Diagnosis not present

## 2021-01-20 DIAGNOSIS — Z85828 Personal history of other malignant neoplasm of skin: Secondary | ICD-10-CM | POA: Diagnosis not present

## 2021-01-20 DIAGNOSIS — J439 Emphysema, unspecified: Secondary | ICD-10-CM | POA: Diagnosis present

## 2021-01-20 DIAGNOSIS — Z803 Family history of malignant neoplasm of breast: Secondary | ICD-10-CM | POA: Diagnosis not present

## 2021-01-20 DIAGNOSIS — I77810 Thoracic aortic ectasia: Secondary | ICD-10-CM | POA: Diagnosis present

## 2021-01-20 DIAGNOSIS — D72828 Other elevated white blood cell count: Secondary | ICD-10-CM | POA: Diagnosis present

## 2021-01-20 DIAGNOSIS — F411 Generalized anxiety disorder: Secondary | ICD-10-CM | POA: Diagnosis present

## 2021-01-20 DIAGNOSIS — Z823 Family history of stroke: Secondary | ICD-10-CM | POA: Diagnosis not present

## 2021-01-20 DIAGNOSIS — J9601 Acute respiratory failure with hypoxia: Secondary | ICD-10-CM | POA: Diagnosis present

## 2021-01-20 DIAGNOSIS — R000 Tachycardia, unspecified: Secondary | ICD-10-CM | POA: Diagnosis not present

## 2021-01-20 DIAGNOSIS — I130 Hypertensive heart and chronic kidney disease with heart failure and stage 1 through stage 4 chronic kidney disease, or unspecified chronic kidney disease: Secondary | ICD-10-CM | POA: Diagnosis present

## 2021-01-20 DIAGNOSIS — G2581 Restless legs syndrome: Secondary | ICD-10-CM | POA: Diagnosis present

## 2021-01-20 DIAGNOSIS — Z90710 Acquired absence of both cervix and uterus: Secondary | ICD-10-CM | POA: Diagnosis not present

## 2021-01-20 DIAGNOSIS — N1831 Chronic kidney disease, stage 3a: Secondary | ICD-10-CM | POA: Diagnosis present

## 2021-01-20 DIAGNOSIS — E1165 Type 2 diabetes mellitus with hyperglycemia: Secondary | ICD-10-CM | POA: Diagnosis present

## 2021-01-20 DIAGNOSIS — Z20822 Contact with and (suspected) exposure to covid-19: Secondary | ICD-10-CM | POA: Diagnosis present

## 2021-01-20 DIAGNOSIS — E1122 Type 2 diabetes mellitus with diabetic chronic kidney disease: Secondary | ICD-10-CM | POA: Diagnosis present

## 2021-01-20 DIAGNOSIS — Z808 Family history of malignant neoplasm of other organs or systems: Secondary | ICD-10-CM | POA: Diagnosis not present

## 2021-01-20 DIAGNOSIS — Z801 Family history of malignant neoplasm of trachea, bronchus and lung: Secondary | ICD-10-CM | POA: Diagnosis not present

## 2021-01-20 DIAGNOSIS — E039 Hypothyroidism, unspecified: Secondary | ICD-10-CM | POA: Diagnosis present

## 2021-01-20 DIAGNOSIS — I5033 Acute on chronic diastolic (congestive) heart failure: Secondary | ICD-10-CM | POA: Diagnosis present

## 2021-01-20 DIAGNOSIS — R0602 Shortness of breath: Secondary | ICD-10-CM

## 2021-01-20 DIAGNOSIS — R0902 Hypoxemia: Secondary | ICD-10-CM | POA: Diagnosis not present

## 2021-01-20 DIAGNOSIS — E782 Mixed hyperlipidemia: Secondary | ICD-10-CM | POA: Diagnosis present

## 2021-01-20 DIAGNOSIS — I10 Essential (primary) hypertension: Secondary | ICD-10-CM | POA: Diagnosis not present

## 2021-01-20 DIAGNOSIS — F32A Depression, unspecified: Secondary | ICD-10-CM | POA: Diagnosis present

## 2021-01-20 DIAGNOSIS — Z800 Family history of malignant neoplasm of digestive organs: Secondary | ICD-10-CM | POA: Diagnosis not present

## 2021-01-20 DIAGNOSIS — Z87891 Personal history of nicotine dependence: Secondary | ICD-10-CM | POA: Diagnosis not present

## 2021-01-20 LAB — BASIC METABOLIC PANEL
Anion gap: 11 (ref 5–15)
BUN: 29 mg/dL — ABNORMAL HIGH (ref 8–23)
CO2: 25 mmol/L (ref 22–32)
Calcium: 9.7 mg/dL (ref 8.9–10.3)
Chloride: 101 mmol/L (ref 98–111)
Creatinine, Ser: 1.13 mg/dL — ABNORMAL HIGH (ref 0.44–1.00)
GFR, Estimated: 51 mL/min — ABNORMAL LOW (ref >60)
Glucose, Bld: 165 mg/dL — ABNORMAL HIGH (ref 70–99)
Potassium: 4.2 mmol/L (ref 3.5–5.1)
Sodium: 137 mmol/L (ref 135–145)

## 2021-01-20 LAB — CBC
HCT: 40 % (ref 36.0–46.0)
Hemoglobin: 13.4 g/dL (ref 12.0–15.0)
MCH: 33 pg (ref 26.0–34.0)
MCHC: 33.5 g/dL (ref 30.0–36.0)
MCV: 98.5 fL (ref 80.0–100.0)
Platelets: 246 10*3/uL (ref 150–400)
RBC: 4.06 MIL/uL (ref 3.87–5.11)
RDW: 14.8 % (ref 11.5–15.5)
WBC: 9.2 10*3/uL (ref 4.0–10.5)
nRBC: 0 % (ref 0.0–0.2)

## 2021-01-20 LAB — ECHOCARDIOGRAM LIMITED
Area-P 1/2: 4.15 cm2
Calc EF: 59.2 %
Height: 67 in
S' Lateral: 3.1 cm
Single Plane A2C EF: 60.1 %
Single Plane A4C EF: 60.6 %
Weight: 4458.58 oz

## 2021-01-20 LAB — MRSA NEXT GEN BY PCR, NASAL: MRSA by PCR Next Gen: NOT DETECTED

## 2021-01-20 MED ORDER — IPRATROPIUM-ALBUTEROL 0.5-2.5 (3) MG/3ML IN SOLN: 3.0000 mL | Freq: Four times a day (QID) | RESPIRATORY_TRACT | Status: DC | PRN

## 2021-01-20 MED ORDER — CEPHALEXIN 500 MG PO CAPS
500.0000 mg | ORAL_CAPSULE | Freq: Three times a day (TID) | ORAL | Status: DC
  Administered 2021-01-20 – 2021-01-26 (×19): 500 mg via ORAL
  Filled 2021-01-20 (×22): qty 1

## 2021-01-20 MED ORDER — OXYCODONE-ACETAMINOPHEN 5-325 MG PO TABS
1.0000 | ORAL_TABLET | Freq: Three times a day (TID) | ORAL | Status: DC | PRN
  Administered 2021-01-21 – 2021-01-24 (×5): 1 via ORAL
  Filled 2021-01-20 (×5): qty 1

## 2021-01-20 MED ORDER — CHLORHEXIDINE GLUCONATE CLOTH 2 % EX PADS: 6.0000 | MEDICATED_PAD | Freq: Every day | CUTANEOUS | Status: DC

## 2021-01-20 MED ORDER — TRAMADOL HCL 50 MG PO TABS
50.0000 mg | ORAL_TABLET | Freq: Four times a day (QID) | ORAL | Status: DC | PRN
  Administered 2021-01-20 – 2021-01-22 (×2): 50 mg via ORAL
  Filled 2021-01-20 (×2): qty 1

## 2021-01-20 MED ORDER — FUROSEMIDE 10 MG/ML IJ SOLN
40.0000 mg | Freq: Once | INTRAMUSCULAR | Status: AC
  Administered 2021-01-20: 40 mg via INTRAVENOUS
  Filled 2021-01-20: qty 4

## 2021-01-20 NOTE — Progress Notes (Signed)
  Echocardiogram 2D Echocardiogram has been performed.  Bobbye Charleston 01/20/2021, 12:25 PM

## 2021-01-20 NOTE — TOC Progression Note (Addendum)
Transition of Care [facility]) - Progression Note    Patient Details  Name: Brandi Stephens MRN: [id number] Date of Birth: 1945/10/24  Transition of Care [facility]) CM/SW Contact  Zenon Mayo, RN Phone Number: 01/20/2021, 4:54 PM  Clinical Narrative:    NCM spoke with patient , offered choice, she does not have a preference .  She lives with spouse, and she states someone will be with her at all times.  She has tub bench, and a walker and bsc.  NCM made referral to [facility] with [facility] awaiting call back.        Expected Discharge Plan and Services           Expected Discharge Date: 01/19/21                                     Social Determinants of Health (SDOH) Interventions    Readmission Risk Interventions Readmission Risk Prevention Plan 08/02/2019  Transportation Screening Complete  PCP or Specialist appointment within 3-5 days of discharge (No Data)  [place] or [place] Complete  SW Recovery Care/Counseling Consult Complete  Palliative Care Screening Not [place] Not Applicable  Some recent data might be hidden

## 2021-01-20 NOTE — Progress Notes (Signed)
PROGRESS NOTE    Brandi Stephens  HYW:[id number] DOB: March 03, 1946 DOA: 01/18/2021 PCP: Girtha Rm, NP-C    Chief Complaint  Patient presents with   Follow-up    Brief Narrative:  75 year old F with PMH of diastolic CHF, COPD not on O2, CKD-3A, HTN, HLD, hypothyroidism, morbid obesity, breast cancer s/p left breast lumpectomy, radiation and hormonal therapy, anxiety, depression and RLS sent to ED from PCP office with hypoxemia and tachycardia.  She went to see PCP for lower extremity edema.  She was admitted with working diagnosis of possible PE that could not be excluded by CTA chest.  She was started on IV heparin.  VQ scan and LE venous Doppler were ordered.   The next day, VQ scan and LE venous US negative for VTE.  Heparin discontinued.  She was ambulated and desaturated to 79% on RA.  Started on IV Lasix.    Assessment & Plan:   Principal Problem:   Acute hypoxemic respiratory failure (HCC) Active Problems:   Essential hypertension   COPD (chronic obstructive pulmonary disease) (HCC)   Prediabetes   CKD (chronic kidney disease), stage III  Acute respiratory failure with hypoxia probably secondary to combination of acute on chronic diastolic heart failure in the setting of emphysema/COPD. Diuresis with IV Lasix, patient reports she is feeling already better. Echocardiogram ordered and is pending.  PE is ruled out by CTA of the chest and VQ scan.  Currently on 1 L of nasal cannula oxygen to keep sats greater than 90% Check ambulating oxygen levels tomorrow.   Chronic COPD Continue with Dulera and duo nebs as needed.   Stage IIIa CKD Repeat BMP this morning.   History of gout Continue with allopurinol.    Hypertension Blood pressure parameters appear to be optimal Resume home medications.    Hypothyroidism Continue with Synthroid.    History of breast cancer s/p left lumpectomy in 2017, radiation and hormonal therapy Continue with home  anastrozole.  Conditioning/debility Therapy evaluation recommending home health PT on discharge.  DVT prophylaxis:Lovenox.  Code Status:  full code.  Family Communication: daughter at bedside.  Disposition:   Status is: Observation  The patient will require care spanning > 2 midnights and should be moved to inpatient because: IV treatments appropriate due to intensity of illness or inability to take PO  Dispo: The patient is from: Home              Anticipated d/c is to: Home              Patient currently is not medically stable to d/c.   Difficult to place patient No       Consultants:  None.   Procedures: none.   Antimicrobials: none.    Subjective: Breathing has improved.  No chest pain on 1 to 2l it of [place] oxygen.   Objective: Vitals:   01/20/21 0359 01/20/21 0744 01/20/21 0839 01/20/21 1052  BP: (!) 144/94 130/77  (!) 120/59  Pulse: (!) 106 95  92  Resp: 17 (!) 25  16  Temp: 98.2 F (36.8 C) 97.9 F (36.6 C)  98 F (36.7 C)  TempSrc: Oral     SpO2: 94% 94% 93%   Weight: 126.4 kg     Height: 5\' 7"  (1.702 m)       Intake/Output Summary (Last 24 hours) at 01/20/2021 1211 Last data filed at 01/20/2021 0300 Gross per 24 hour  Intake --  Output 1300 ml  Net -1300 ml  Filed Weights   01/20/21 0359  Weight: 126.4 kg    Examination:  General exam: Appears calm and comfortable  Respiratory system: Clear to auscultation. Respiratory effort normal. Cardiovascular system: S1 & S2 heard, RRR. No JVD,  No pedal edema. Gastrointestinal system: Abdomen is nondistended, soft and nontender.Normal bowel sounds heard. Central nervous system: Alert and oriented. No focal neurological deficits. Extremities: Symmetric 5 x 5 power. Skin: No rashes, lesions or ulcers Psychiatry: Mood & affect appropriate.     Data Reviewed: I have personally reviewed following labs and imaging studies  CBC: Recent Labs  Lab 01/18/21 1348 01/19/21 0403 01/20/21 0404  WBC  12.8* 10.9* 9.2  NEUTROABS 9.9*  --   --   HGB 14.0 12.1 13.4  HCT 42.6 37.0 40.0  MCV 98.6 99.7 98.5  PLT 250 198 242    Basic Metabolic Panel: Recent Labs  Lab 01/18/21 1348 01/19/21 1500 01/19/21 1604  NA 137 138  --   K 4.9 4.5  --   CL 100 102  --   CO2 25 27  --   GLUCOSE 170* 194*  --   BUN 25* 29*  --   CREATININE 1.30* 1.12*  --   CALCIUM 10.0 9.3  --   MG  --   --  2.4  PHOS  --  4.3  --     GFR: Estimated Creatinine Clearance: 60.9 mL/min (A) (by C-G formula based on SCr of 1.12 mg/dL (H)).  Liver Function Tests: Recent Labs  Lab 01/19/21 1500  ALBUMIN 3.7    CBG: No results for input(s): GLUCAP in the last 168 hours.   Recent Results (from the past 240 hour(s))  Resp Panel by RT-PCR (Flu A&B, Covid) Nasopharyngeal Swab     Status: None   Collection Time: 01/18/21  8:46 PM   Specimen: Nasopharyngeal Swab; Nasopharyngeal(NP) swabs in vial transport medium  Result Value Ref Range Status   SARS Coronavirus 2 by RT PCR NEGATIVE NEGATIVE Final    Comment: (NOTE) SARS-CoV-2 target nucleic acids are NOT DETECTED.  The SARS-CoV-2 RNA is generally detectable in upper respiratory specimens during the acute phase of infection. The lowest concentration of SARS-CoV-2 viral copies this assay can detect is 138 copies/mL. A negative result does not preclude SARS-Cov-2 infection and should not be used as the sole basis for treatment or other patient management decisions. A negative result may occur with  improper specimen collection/handling, submission of specimen other than nasopharyngeal swab, presence of viral mutation(s) within the areas targeted by this assay, and inadequate number of viral copies(<138 copies/mL). A negative result must be combined with clinical observations, patient history, and epidemiological information. The expected result is Negative.  Fact Sheet for Patients:  EntrepreneurPulse.com.au  Fact Sheet for Healthcare  Providers:  IncredibleEmployment.be  This test is no t yet approved or cleared by the [place] FDA and  has been authorized for detection and/or diagnosis of SARS-CoV-2 by FDA under an Emergency Use Authorization (EUA). This EUA will remain  in effect (meaning this test can be used) for the duration of the COVID-19 declaration under Section 564(b)(1) of the Act, 21 U.S.C.section 360bbb-3(b)(1), unless the authorization is terminated  or revoked sooner.       Influenza A by PCR NEGATIVE NEGATIVE Final   Influenza B by PCR NEGATIVE NEGATIVE Final    Comment: (NOTE) The Xpert Xpress SARS-CoV-2/FLU/RSV plus assay is intended as an aid in the diagnosis of influenza from Nasopharyngeal swab specimens and should not  be used as a sole basis for treatment. Nasal washings and aspirates are unacceptable for Xpert Xpress SARS-CoV-2/FLU/RSV testing.  Fact Sheet for Patients: EntrepreneurPulse.com.au  Fact Sheet for Healthcare Providers: IncredibleEmployment.be  This test is not yet approved or cleared by the [place] FDA and has been authorized for detection and/or diagnosis of SARS-CoV-2 by FDA under an Emergency Use Authorization (EUA). This EUA will remain in effect (meaning this test can be used) for the duration of the COVID-19 declaration under Section 564(b)(1) of the Act, 21 U.S.C. section 360bbb-3(b)(1), unless the authorization is terminated or revoked.  Performed at [facility], [place] [address], [place], [place] [address]   MRSA Next Gen by PCR, Nasal     Status: None   Collection Time: 01/20/21  4:00 AM   Specimen: Nasal Mucosa; Nasal Swab  Result Value Ref Range Status   MRSA by PCR Next Gen NOT DETECTED NOT DETECTED Final    Comment: (NOTE) The GeneXpert MRSA Assay (FDA approved for NASAL specimens only), is one component of a comprehensive MRSA colonization surveillance program. It is not intended to  diagnose MRSA infection nor to guide or monitor treatment for MRSA infections. Test performance is not FDA approved in patients less than 26 years old. Performed at [facility], [place] [address] [place], [place] [address]          Radiology Studies: DG Chest 1 View  Result Date: 01/18/2021 CLINICAL DATA:  Shortness of breath. EXAM: CHEST  1 VIEW COMPARISON:  Chest x-ray 05/20/2020. FINDINGS: Heart is mildly enlarged. There is no pleural effusion or pneumothorax. There is some strandy interstitial opacities in the left lower lung. Surgical clips are seen in the left axilla. No acute fractures are identified. IMPRESSION: 1. Minimal opacities in the left lower lung may represent atelectasis/scarring or mild infection. 2. Stable cardiomegaly. Electronically Signed   By: Ronney Asters M.D.   On: 01/18/2021 16:01   CT Angio Chest PE W and/or Wo Contrast  Result Date: 01/18/2021 CLINICAL DATA:  Decreased oxygen saturation. EXAM: CT ANGIOGRAPHY CHEST WITH CONTRAST TECHNIQUE: Multidetector CT imaging of the chest was performed using the standard protocol during bolus administration of intravenous contrast. Multiplanar CT image reconstructions and MIPs were obtained to evaluate the vascular anatomy. CONTRAST:  37mL OMNIPAQUE IOHEXOL 350 MG/ML SOLN COMPARISON:  May 22, 2020 FINDINGS: Cardiovascular: There is mild calcification of the aortic arch. The subsegmental pulmonary arteries are limited in evaluation secondary to areas of overlying artifact and suboptimal opacification with intravenous contrast. An ill-defined area of low attenuation is seen involving a lower lobe branch of the right pulmonary artery without definite evidence of intraluminal filling defects. Normal heart size. No pericardial effusion. Mediastinum/Nodes: No enlarged mediastinal, hilar, or axillary lymph nodes. Thyroid gland, trachea, and esophagus demonstrate no significant findings. Lungs/Pleura: Mild atelectasis is seen  within the bilateral lung bases. There is no evidence of a pleural effusion or pneumothorax. Upper Abdomen: No acute abnormality. Musculoskeletal: No chest wall abnormality. No acute or significant osseous findings. Review of the MIP images confirms the above findings. IMPRESSION: 1. Ill-defined area of low attenuation involving a lower lobe branch of the right pulmonary artery without definite evidence of intraluminal filling defects. A small amount of pulmonary embolism can not completely be excluded. Further evaluation with a nuclear medicine ventilation/perfusion scan is recommended. 2. Mild bibasilar atelectasis. 3. Aortic atherosclerosis. Aortic Atherosclerosis (ICD10-I70.0). Electronically Signed   By: Virgina Norfolk M.D.   On: 01/18/2021 20:21  NM Pulmonary Perfusion  Result Date: 01/19/2021 CLINICAL DATA:  Decreased oxygen saturation. Shortness of breath. Increased heart rate. Patient underwent a CTA chest yesterday, findings with a possible small embolus a segmental right lower lobe branch. EXAM: NUCLEAR MEDICINE PERFUSION LUNG SCAN TECHNIQUE: Perfusion images were obtained in multiple projections after intravenous injection of radiopharmaceutical. Ventilation scans intentionally deferred if perfusion scan and chest x-ray adequate for interpretation during COVID 19 epidemic. RADIOPHARMACEUTICALS:  4.4 mCi Tc-31m MAA IV COMPARISON:  CTA chest and chest radiographs from 01/18/2021. FINDINGS: There is nonsegmental patchy decreased perfusion to the middle and upper lungs bilaterally most consistent with emphysema and/or shunting firm small airways disease. There are no convincing segmental defects to suggest pulmonary thromboembolism. IMPRESSION: 1. Low probability study for pulmonary thromboembolism. Areas of nonsegmental decreased perfusion predominate in upper lungs consistent with changes from emphysema and/or small airways disease. Electronically Signed   By: Lajean Manes M.D.   On: 01/19/2021  11:49   VAS [place] LOWER EXTREMITY VENOUS (DVT)  Result Date: 01/19/2021  Lower Venous DVT Study Patient Name:  AITANNA HAUBNER  Date of Exam:   01/19/2021 Medical Rec #: [id number]         Accession #:    [id number] Date of Birth: [birth year]         Patient Gender: F Patient Age:   76 years Exam Location:  [facility] Procedure:      VAS [place] LOWER EXTREMITY VENOUS (DVT) Referring Phys: Wandra Feinstein RATHORE --------------------------------------------------------------------------------  Indications: Swelling, and Edema.  Comparison Study: 05/12/20 prior Performing Technologist: Archie Patten RVS  Examination Guidelines: A complete evaluation includes B-mode imaging, spectral Doppler, color Doppler, and power Doppler as needed of all accessible portions of each vessel. Bilateral testing is considered an integral part of a complete examination. Limited examinations for reoccurring indications may be performed as noted. The reflux portion of the exam is performed with the patient in reverse Trendelenburg.  +---------+---------------+---------+-----------+----------+--------------+ RIGHT    CompressibilityPhasicitySpontaneityPropertiesThrombus Aging +---------+---------------+---------+-----------+----------+--------------+ CFV      Full           Yes      Yes                                 +---------+---------------+---------+-----------+----------+--------------+ SFJ      Full                                                        +---------+---------------+---------+-----------+----------+--------------+ FV Prox  Full                                                        +---------+---------------+---------+-----------+----------+--------------+ FV Mid   Full                                                        +---------+---------------+---------+-----------+----------+--------------+ FV DistalFull                                                         +---------+---------------+---------+-----------+----------+--------------+  PFV      Full                                                        +---------+---------------+---------+-----------+----------+--------------+ POP      Full           Yes      Yes                                 +---------+---------------+---------+-----------+----------+--------------+ PTV      Full                                                        +---------+---------------+---------+-----------+----------+--------------+ PERO     Full                                                        +---------+---------------+---------+-----------+----------+--------------+   +---------+---------------+---------+-----------+----------+--------------+ LEFT     CompressibilityPhasicitySpontaneityPropertiesThrombus Aging +---------+---------------+---------+-----------+----------+--------------+ CFV      Full           Yes      Yes                                 +---------+---------------+---------+-----------+----------+--------------+ SFJ      Full                                                        +---------+---------------+---------+-----------+----------+--------------+ FV Prox  Full                                                        +---------+---------------+---------+-----------+----------+--------------+ FV Mid   Full                                                        +---------+---------------+---------+-----------+----------+--------------+ FV DistalFull                                                        +---------+---------------+---------+-----------+----------+--------------+ PFV      Full                                                        +---------+---------------+---------+-----------+----------+--------------+  POP      Full           Yes      Yes                                  +---------+---------------+---------+-----------+----------+--------------+ PTV      Full                                                        +---------+---------------+---------+-----------+----------+--------------+ PERO     Full                                                        +---------+---------------+---------+-----------+----------+--------------+     Summary: BILATERAL: - No evidence of deep vein thrombosis seen in the lower extremities, bilaterally. -No evidence of popliteal cyst, bilaterally.   *See table(s) above for measurements and observations. Electronically signed by Deitra Mayo MD on 01/19/2021 at 5:39:35 PM.    Final         Scheduled Meds:  allopurinol  300 mg Oral Daily   amitriptyline  75 mg Oral QHS   anastrozole  1 mg Oral Daily   atorvastatin  20 mg Oral Daily   cephALEXin  500 mg Oral TID   DULoxetine  60 mg Oral Daily   enoxaparin (LOVENOX) injection  40 mg Subcutaneous Q24H   levothyroxine  75 mcg Oral Q0600   metoprolol succinate  25 mg Oral Daily   mometasone-formoterol  2 puff Inhalation BID   primidone  50 mg Oral BID   QUEtiapine  25 mg Oral QHS   rOPINIRole  0.75 mg Oral QHS   vitamin B-12  1,000 mcg Oral Daily   Continuous Infusions:   LOS: 0 days        Hosie Poisson, MD [facility]   To contact the attending provider between 7A-7P or the covering provider during after hours 7P-7A, please log into the web site [email or web address] and access using universal Jet password for that web site. If you do not have the password, please call the hospital operator.  01/20/2021, 12:11 PM

## 2021-01-20 NOTE — Care Management Obs Status (Signed)
[place] NOTIFICATION   Patient Details  Name: ELYSSE POLIDORE MRN: [id number] Date of Birth: Oct 19, 1945   Medicare Observation Status Notification Given:  Yes    Carles Collet, RN 01/20/2021, 8:55 AM

## 2021-01-20 NOTE — TOC Initial Note (Signed)
Transition of Care [facility]) - Initial/Assessment Note    Patient Details  Name: Brandi Stephens MRN: [id number] Date of Birth: 1946/03/21  [facility] CM/SW Contact:    Zenon Mayo, RN Phone Number: 01/20/2021, 4:59 PM  Clinical Narrative:                 NCM spoke with patient , offered choice, she does not have a preference .  She lives with spouse, and she states someone will be with her at all times.  She has tub bench, and a walker and bsc  NCM made referral to [facility] with [facility] awaiting call back.    Expected Discharge Plan: [place] Barriers to Discharge: Continued Medical Work up   Patient Goals and CMS Choice Patient states their goals for this hospitalization and ongoing recovery are:: return home with [facility] CMS Medicare.gov Compare Post Acute Care list provided to:: Patient Choice offered to / list presented to : Patient  Expected Discharge Plan and Services Expected Discharge Plan: [place]   Discharge Planning Services: CM Consult Post Acute Care Choice: [place] arrangements for the past 2 months: Single Family Home Expected Discharge Date: 01/19/21                                    Prior Living Arrangements/Services Living arrangements for the past 2 months: Single Family Home Lives with:: Spouse Patient language and need for interpreter reviewed:: Yes Do you feel safe going back to the place where you live?: Yes      Need for Family Participation in Patient Care: Yes (Comment) Care giver support system in place?: Yes (comment) Current home services:  (rolling walker and bsc, tube bench) Criminal Activity/Legal Involvement Pertinent to Current Situation/Hospitalization: No - Comment as needed  Activities of Daily Living      Permission Sought/Granted                  Emotional Assessment   Attitude/Demeanor/Rapport: Engaged Affect (typically observed):  Appropriate Orientation: : Oriented to Self, Oriented to Place, Oriented to  Time, Oriented to Situation Alcohol / Substance Use: Not Applicable Psych Involvement: No (comment)  Admission diagnosis:  SOB (shortness of breath) [R06.02] Hypoxia [R09.02] Acute hypoxemic respiratory failure (HCC) [J96.01] Acute respiratory failure with hypoxia ([place]) [J96.01] Patient Active Problem List   Diagnosis Date Noted   Acute respiratory failure with hypoxia ([place]) 01/20/2021   Acute hypoxemic respiratory failure ([place]) 01/18/2021   Short-term memory loss 02/24/2020   Peripheral edema 02/24/2020   Osteopenia 01/22/2020   Aortic atherosclerosis ([place]) 01/22/2020   Wheezing 01/22/2020   Insomnia 10/29/2019   Recurrent falls 09/06/2019   Confusion, postoperative    Benign essential HTN    Post-operative pain    Chronic constipation    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   Post op infection 07/11/2019   Muscle spasm    Hypokalemia    Status post lumbar spinal fusion 07/10/2019   Postoperative pain    Acute blood loss anemia    Labile blood pressure    Tobacco abuse    Hypoalbuminemia due to protein-calorie malnutrition ([place])    Anxiety 07/05/2019   Depression 07/05/2019   Chronic diastolic CHF (congestive heart failure) ([place]) 07/05/2019   Obesity (BMI 30-39.9) 07/05/2019   ETOH abuse 07/05/2019   Lumbar radiculopathy 06/28/2019   Spinal stenosis of lumbar region  06/28/2019   Acute on chronic anemia    Tremors of nervous system    AKI (acute kidney injury) ([place])    Chronic pain syndrome    Acute renal failure superimposed on stage 3 chronic kidney disease ([place]) 06/25/2019   Acute osteomyelitis of lumbar spine (HCC) 06/25/2019   Weakness of left lower extremity    Abdominal pain 06/24/2019   Nausea 06/24/2019   Abnormal urine odor 06/24/2019   History of sepsis 06/24/2019   Recent major surgery 06/24/2019   History of diverticulosis 06/24/2019   Elevated PTHrP level 05/09/2019    Paresthesia 02/12/2019   Low back pain 02/12/2019   Neuropathy 12/20/2018   Lumbar spinal stenosis 06/29/2018   Preoperative clearance 06/04/2018   Abnormal CT of the chest 06/04/2018   Smoker 05/17/2018   Hypothyroidism 11/30/2017   Thoracic aortic atherosclerosis ([place]) 09/14/2016   Benign essential tremor 09/09/2016   RLS (restless legs syndrome) 09/09/2016   H/O adenomatous polyp of colon 07/06/2016   PVC's (premature ventricular contractions) 03/29/2016   Preoperative cardiovascular examination 03/29/2016   Grade I diastolic dysfunction 97/98/9211   Palpitations 01/11/2016   Shortness of breath 01/11/2016   Former cigarette smoker 01/11/2016   Leg edema 01/11/2016   Chronic cough 10/28/2015   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   Malignant neoplasm of upper-inner quadrant of left breast in female, estrogen receptor positive ([place]) 07/14/2015   Prediabetes 07/08/2015   CKD (chronic kidney disease), stage III 07/08/2015   Dependent edema 06/08/2015   Sleep disturbance 06/08/2015   Mixed hyperlipidemia 06/08/2015   Generalized anxiety disorder 06/08/2015   Essential hypertension 06/08/2015   COPD (chronic obstructive pulmonary disease) ([place]) 06/08/2015   Gout 06/08/2015   Macrocytic anemia    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[place])    Sepsis ([place]) 05/15/2015   Lumbar spondylosis 03/20/2015   PCP:  Girtha Rm, NP-C Pharmacy:   [id number] - [place], [address] AT [address] [address] Phone: [phone] Fax: [phone]     Social Determinants of Health (SDOH) Interventions    Readmission Risk Interventions Readmission Risk Prevention Plan 08/02/2019  Transportation Screening Complete  PCP or Specialist appointment within 3-5 days of discharge (No Data)  [place] or [place] Complete  SW Recovery Care/Counseling Consult Complete  Palliative Care Screening Not  [place] Not Applicable  Some recent data might be hidden

## 2021-01-20 NOTE — TOC Progression Note (Signed)
Transition of Care [facility]) - Progression Note    Patient Details  Name: Brandi Stephens MRN: [id number] Date of Birth: [birth year]  [facility] CM/SW Contact  Zenon Mayo, RN Phone Number: 01/20/2021, 4:57 PM  Clinical Narrative:    NCM spoke with patient , offered choice, she does not have a preference .  She lives with spouse, and she states someone will be with her at all times.  She has tub bench, and a walker and bsc.  NCM made referral to [facility] with [facility] awaiting call back.     Expected Discharge Plan: [place] Barriers to Discharge: Continued Medical Work up  Expected Discharge Plan and Services Expected Discharge Plan: [place]   Discharge Planning Services: CM Consult Post Acute Care Choice: [place] arrangements for the past 2 months: Single Family Home Expected Discharge Date: 01/19/21                                     Social Determinants of Health (SDOH) Interventions    Readmission Risk Interventions Readmission Risk Prevention Plan 08/02/2019  Transportation Screening Complete  PCP or Specialist appointment within 3-5 days of discharge (No Data)  [place] or [place] Complete  SW Recovery Care/Counseling Consult Complete  Palliative Care Screening Not [place] Not Applicable  Some recent data might be hidden

## 2021-01-20 NOTE — Progress Notes (Signed)
SATURATION QUALIFICATIONS: (This note is used to comply with regulatory documentation for home oxygen)  Patient Saturations on Room Air at Rest = 86%  Patient Saturations on 2 Liters of oxygen  =94%

## 2021-01-21 DIAGNOSIS — I10 Essential (primary) hypertension: Secondary | ICD-10-CM

## 2021-01-21 DIAGNOSIS — J9601 Acute respiratory failure with hypoxia: Secondary | ICD-10-CM

## 2021-01-21 DIAGNOSIS — R0902 Hypoxemia: Secondary | ICD-10-CM

## 2021-01-21 DIAGNOSIS — R000 Tachycardia, unspecified: Secondary | ICD-10-CM

## 2021-01-21 DIAGNOSIS — R29898 Other symptoms and signs involving the musculoskeletal system: Secondary | ICD-10-CM

## 2021-01-21 LAB — CBC
HCT: 37.7 % (ref 36.0–46.0)
Hemoglobin: 12.1 g/dL (ref 12.0–15.0)
MCH: 32.2 pg (ref 26.0–34.0)
MCHC: 32.1 g/dL (ref 30.0–36.0)
MCV: 100.3 fL — ABNORMAL HIGH (ref 80.0–100.0)
Platelets: 201 10*3/uL (ref 150–400)
RBC: 3.76 MIL/uL — ABNORMAL LOW (ref 3.87–5.11)
RDW: 14.6 % (ref 11.5–15.5)
WBC: 8.8 10*3/uL (ref 4.0–10.5)
nRBC: 0 % (ref 0.0–0.2)

## 2021-01-21 LAB — BASIC METABOLIC PANEL
Anion gap: 10 (ref 5–15)
BUN: 41 mg/dL — ABNORMAL HIGH (ref 8–23)
CO2: 27 mmol/L (ref 22–32)
Calcium: 9.6 mg/dL (ref 8.9–10.3)
Chloride: 100 mmol/L (ref 98–111)
Creatinine, Ser: 1.27 mg/dL — ABNORMAL HIGH (ref 0.44–1.00)
GFR, Estimated: 44 mL/min — ABNORMAL LOW (ref >60)
Glucose, Bld: 184 mg/dL — ABNORMAL HIGH (ref 70–99)
Potassium: 4.2 mmol/L (ref 3.5–5.1)
Sodium: 137 mmol/L (ref 135–145)

## 2021-01-21 MED ORDER — BUMETANIDE 1 MG PO TABS
1.0000 mg | ORAL_TABLET | Freq: Every day | ORAL | Status: DC
  Administered 2021-01-22 – 2021-01-25 (×4): 1 mg via ORAL
  Filled 2021-01-21 (×4): qty 1

## 2021-01-21 MED ORDER — FUROSEMIDE 10 MG/ML IJ SOLN
20.0000 mg | Freq: Once | INTRAMUSCULAR | Status: AC
  Administered 2021-01-21: 20 mg via INTRAVENOUS
  Filled 2021-01-21: qty 2

## 2021-01-21 MED ORDER — METOPROLOL SUCCINATE ER 50 MG PO TB24
50.0000 mg | ORAL_TABLET | Freq: Every day | ORAL | Status: DC
  Administered 2021-01-22 – 2021-01-26 (×5): 50 mg via ORAL
  Filled 2021-01-21 (×5): qty 1

## 2021-01-21 NOTE — Progress Notes (Signed)
Physical Therapy Treatment Patient Details Name: Brandi Stephens MRN: [id number] DOB: July 21, 1945 Today's Date: 01/21/2021   History of Present Illness Brandi Stephens is a 75 y/o female admitted observation status 01/18/21 with tachycardic and hypoxic with oxygen saturation 88% on room air. CT angiogram chest showing ill-defined area of low-attenuation involving a lower lobe branch of the right pulmonary artery without definite evidence of intraluminal filling defects; small PE not excluded. 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 Reports limited mobility at baseline due to chronic back problems.    PT Comments    The pt continues to make good progress with mobility goals, but continues to require 3L O2 as well as cues for standing rest breaks with PLB to maintain SpO2 >90% at this time. The pt will continue to benefit from skilled intervention to address endurance and safety awareness for improved self-monitoring. May benefit from 4-wheel walker to allow for increased availability for rest breaks to improve safety with mobility ad longer distances.    Recommendations for follow up therapy are one component of a multi-disciplinary discharge planning process, led by the attending physician.  Recommendations may be updated based on patient status, additional functional criteria and insurance authorization.  Follow Up Recommendations  Home health PT     Equipment Recommendations  None recommended by PT (vs 4-wheel walker)    Recommendations for Other Services       Precautions / Restrictions Precautions Precautions: Fall Precaution Comments: watch O2 Restrictions Weight Bearing Restrictions: No     Mobility  Bed Mobility Overal bed mobility: Modified  Independent Bed Mobility: Supine to Sit;Sit to Supine     Supine to sit: Modified independent (Device/Increase time) Sit to supine: Modified independent (Device/Increase time)   General bed mobility comments: no assist, pt able to complete within normal time    Transfers Overall transfer level: Needs assistance Equipment used: Rolling walker (2 wheeled) Transfers: Sit to/from Stand Sit to Stand: Supervision         General transfer comment: no assist needed, pt given supervision for safety and lines  Ambulation/Gait Ambulation/Gait assistance: Min guard Gait Distance (Feet): 200 Feet Assistive device: Rolling walker (2 wheeled) Gait Pattern/deviations: Step-through pattern;Decreased stride length Gait velocity: decreased Gait velocity interpretation: 1.31 - 2.62 ft/sec, indicative of limited community ambulator General Gait Details: minG for safety and lines, monitoring of SpO2. Pt needing cues to initiate standing rest. SpO2 86-95% on 3L       Balance Overall balance assessment: Mild deficits observed, not formally tested                                          Cognition Arousal/Alertness: Awake/alert Behavior During Therapy: WFL for tasks assessed/performed Overall Cognitive Status: No family/caregiver present to determine baseline cognitive functioning Area of Impairment: Attention;Memory;Safety/judgement                   Current Attention Level: Selective Memory: Decreased short-term memory   Safety/Judgement: Decreased awareness of safety     General Comments: pt repeating some PMH multiple times through session, cues to focus on self-monitoring of exertion as pt not identifying need for rest even with significant DOE while attempting to maintain conversation  General Comments General comments (skin integrity, edema, etc.): Pt on 3L upon arrival, required 3L to maintain SpO2 >90%      Pertinent Vitals/Pain Pain  Assessment: No/denies pain Pain Intervention(s): Monitored during session     PT Goals (current goals can now be found in the care plan section) Acute Rehab PT Goals Patient Stated Goal: to get home and decreased edema PT Goal Formulation: With patient Time For Goal Achievement: 02/02/21 Potential to Achieve Goals: Good Progress towards PT goals: Progressing toward goals    Frequency    Min 3X/week      PT Plan Current plan remains appropriate       AM-PAC PT "6 Clicks" Mobility   Outcome Measure  Help needed turning from your back to your side while in a flat bed without using bedrails?: None Help needed moving from lying on your back to sitting on the side of a flat bed without using bedrails?: A Little Help needed moving to and from a bed to a chair (including a wheelchair)?: A Little Help needed standing up from a chair using your arms (e.g., wheelchair or bedside chair)?: A Little Help needed to walk in hospital room?: A Little Help needed climbing 3-5 steps with a railing? : A Little 6 Click Score: 19    End of Session Equipment Utilized During Treatment: Gait belt;Oxygen Activity Tolerance: Patient limited by fatigue Patient left: with call bell/phone within reach;in chair;with family/visitor present Nurse Communication: Mobility status PT Visit Diagnosis: Other abnormalities of gait and mobility (R26.89)     Time: 4081-4481 PT Time Calculation (min) (ACUTE ONLY): 22 min  Charges:  $Gait Training: 8-22 mins                     West Carbo, PT, DPT   Acute Rehabilitation Department Pager #: [phone]   Sandra Cockayne 01/21/2021, 1:56 PM

## 2021-01-21 NOTE — Consult Note (Signed)
Cardiology Consultation:   Patient ID: MEHA VIDRINE MRN: [id number]; DOB: [birth year]  Admit date: 01/18/2021 Date of Consult: 01/21/2021  PCP:  Girtha Rm, NP-C   [facility] Providers Cardiologist:  [name], MD   {   Patient Profile:   Brandi Stephens is a 75 y.o. female with a PMH of PMH of chronic diastolic CHF, HTN, HLD, hypothyroidism, GERD, left breast cancer s/p lumpectomy/radiation/antiestrogen therapy in 2017, CKD stage 3a, obesity, pre-diabetes, COPD, anxiety with depression, gout, who is being seen 01/21/2021 for the evaluation of CHF at the request of Dr. Karleen Hampshire.  History of Present Illness:   Ms. Tranchina follows Dr Debara Pickett outpatient, she had holter monitor from 01/18/2016 showed sinus rhythm with tachycardia and infrequent PVCs.  Last Echo from 07/07/20 showed EF 65-70%, no RMWA, moderate LVH, grade I DD, normal RV no valvular disease, mild dilatation of ascending aorta 39 mm. She had recent low risk negative stress myovue with LVEF 60% from 01/07/21.   She was last seen by Ms Tommye Standard PA-C on 12/23/20 in the office for ongoing complains of LE edema and pre-op evaluation for gastric bypass surgery.  BNP was WNL historically. Echo as above. Her venous doppler were negative for DVT last on 09/21/20 and 01/19/21. Her cardiac workup as above are not suggestive of significant CM or ischemia. Her leg edema was felt due to venous insufficiency and obesity. She was referred to vascular surgery and no intervention was recommended. She was told to wear compression stocking and weight loss, while continue low sodium diet and PO lasix 40mg  daily for chronic diastolic heart failure. She was evaluated for gastric bypass surgery, has chronic DOE, no chest pain, unable complete 4 METS due to decondition/weight/joint pain, stress myovue was suggested which was negative on 01/07/21 as above.   She presented to ER on 01/18/21 for SOB. PCP found her hypoxic with pox high 80s  on room air, and  tachycardiac at the office. She was placed on 4LNC. She was sent to ER for evaluation. She c/o acute on chronic DOE and BLE edema 3 days ago. She states she is always SOB with exertion, symptoms much worsens when she visit the doctor due to anxiety.  She states has been taking her Lasix 40mg  daily, but feels it does not do much for her SOB and swelling. She states she drinks a lot of water at baseline, not consuming much salt. She stopped smoking 4-5 yrs ago, did smokes 1/2 PPD for 50 years at least, states her COPD is mild and uses albuterol and advair at home, noting her self cough and wheezing at times. She denied any chest pain or pressure. She does not do much at baseline due to chronic back pain and obesity, is considering gastric bypass. She felt her SOB is improved and leg swelling is better after getting IV Lasix.   Admission diagnostics showed BUN 25, Cr 1.3 , GFR 43, glucose 170 on BMP. Hs trop 11 and BNP 46.5. CBC diff with leukocytosis 12800. D dimer mild elevated 0.84. A1C 7%. Flu and COVID -19 negative. CTA chest showed Ill-defined area of low attenuation involving a lower lobe branch of the right pulmonary artery without definite evidence of intraluminal filling defects. A small amount of pulmonary embolism can not completely be excluded. Mild bibasilar atelectasis. VQ scan 10/4 showed low probability study for pulmonary thromboembolism. EKG from 01/18/21 13:42 showed ST 113 bpm, TWI of lateral leads appears old, no acute changes. She is subsequently  admitted to hospital medicine for acute hypoxic respiratory failure, etiology felt due to decompensated diastolic CHF, she was given IV Lasix, renal index rising and IV lasix was reduced from 40mg  daily to 20mg  daily today. Echo from 10/5 showed EF 60-65%, mild LVH, grade I DD, mild mild dilatation of the ascending aorta 41 mm. Cardiology is consulted today for further input.    Past Medical History:  Diagnosis Date   AIN (anal intraepithelial  neoplasia) anal canal    Anemia    with pregnancy   Anxiety    Arthritis    knees, lower back, knees   Chronic constipation    Chronic diastolic CHF (congestive heart failure) ([place]) 07/05/2019   CKD (chronic kidney disease)    Coarse tremors    Essential   COPD (chronic obstructive pulmonary disease) ([place])    2017 chest CT, history of   Degenerative lumbar spinal stenosis    Depression    Depression 07/05/2019   Drug dependency ([place])    Elevated PTHrP level 05/09/2019   ETOH abuse 07/05/2019   GERD (gastroesophageal reflux disease)    pt denies   Gout    Grade I diastolic dysfunction 79/05/4095   Noted ECHO   Hemorrhoids    History of adenomatous polyp of colon    History of basal cell carcinoma (BCC) excision 2015   nose   History of sepsis 05/14/2014   post lumbar surgery   History of shingles    x2   Hyperlipidemia    Hypertension    Hypothyroidism    Irregular heart beat    history of while undergoing radiation   Malignant neoplasm of upper-inner quadrant of left breast in female, estrogen receptor positive [facility]) oncologist-  dr Jana Hakim--  per lov in epic no recurrence   dx 07-13-2015  Left breast invasive ductal carcinoma, Stage IA, Grade 1 (TXN0),  09-16-2015  s/p  left breast lumpectomy with sln bx's,  completed radiation 11-18-2015,  started antiestrogen therapy 12-14-2015   Neuropathy    Left foot and back of left leg   Obesity (BMI 30-39.9) 07/05/2019   Personal history of radiation therapy    completed 11-18-2015  left breast   Pneumonia    PONV (postoperative nausea and vomiting)    Prediabetes    diet controlled, no med   Restless leg syndrome    Seasonal allergies    Seizure ([place]) 05/2015   due to sepsis, only one time episode   Tremor    Wears partial dentures    lower     Past Surgical History:  Procedure Laterality Date   ABDOMINAL HYSTERECTOMY  1985   BASAL CELL CARCINOMA EXCISION  10/15   nose   BREAST BIOPSY Right 08/24/2015   BREAST  BIOPSY Right 07/13/2015   BREAST BIOPSY Left 07/13/2015   BREAST EXCISIONAL BIOPSY Left 1972   BREAST LUMPECTOMY Left    BREAST LUMPECTOMY WITH RADIOACTIVE SEED AND SENTINEL LYMPH NODE BIOPSY Left 09/16/2015   Procedure: BREAST LUMPECTOMY WITH RADIOACTIVE SEED AND SENTINEL LYMPH NODE BIOPSY;  Surgeon: Stark Klein, MD;  Location: [facility] [place];  Service: General;  Laterality: Left;   BREAST REDUCTION SURGERY Bilateral 1998   CATARACT EXTRACTION W/ INTRAOCULAR LENS IMPLANT Left 2016   CERVICAL SPINE SURGERY  1995   hallo   COLONOSCOPY  01/2013   polyps   EYE SURGERY Left 2016   HAND SURGERY Left 02-19-2002   dr Fredna Dow  @[facility]    repair collateral ligament/ MPJ left thumb  HARDWARE REMOVAL N/A 07/24/2019   Procedure: REVISION OF HARDWARE Lumbar two - Sacral one. Extention of Fusion to Lumbar one;  Surgeon: [name], MD;  Location: [place];  Service: Neurosurgery;  Laterality: N/A;   HEMORRHOID SURGERY  2008   HIGH RESOLUTION ANOSCOPY N/A 02/28/2018   Procedure: HIGH RESOLUTION ANOSCOPY WITH BIOPSY;  Surgeon: Leighton Ruff, MD;  Location: [place];  Service: General;  Laterality: N/A;   KNEE ARTHROSCOPY Right 2002   LUMBAR SPINE SURGERY  03-20-2015   dr Kathyrn Sheriff   fusion L4-5, L5-S1   MOUTH SURGERY  07/14/2018   2 infected teeth removed   MULTIPLE TOOTH EXTRACTIONS     with bone grafting lower bottom right and left   REDUCTION MAMMAPLASTY Bilateral    RIGHT COLECTOMY  09-10-2003   dr Margot Chimes @[facility]    multiple colon polyps (per path tubular adenoma's, hyperplastic , benign appendix, benign two lymph nodes   ROTATOR CUFF REPAIR Left 04/2016   TONSILLECTOMY  1969   TUBAL LIGATION Bilateral 1982     Home Medications:  Prior to Admission medications   Medication Sig Start Date End Date Taking? Authorizing Provider  albuterol (VENTOLIN HFA) 108 (90 Base) MCG/ACT inhaler INHALE 2 PUFFS BY MOUTH EVERY 4 TO 6 HOURS AS NEEDED Patient taking differently:  Inhale 2 puffs into the lungs every 4 (four) hours as needed for wheezing. 08/09/19  Yes Angiulli, Lavon Paganini, PA-C  allopurinol (ZYLOPRIM) 300 MG tablet 1 daily Patient taking differently: Take 300 mg by mouth daily. 1 daily 01/07/21  Yes Henson, Vickie L, NP-C  amitriptyline (ELAVIL) 75 MG tablet TAKE 1 TABLET(75 MG) BY MOUTH AT BEDTIME Patient taking differently: Take 75 mg by mouth at bedtime. 09/02/20  Yes Raulkar, Clide Deutscher, MD  anastrozole (ARIMIDEX) 1 MG tablet TAKE 1 TABLET(1 MG) BY MOUTH DAILY Patient taking differently: Take 1 mg by mouth daily. 01/27/20  Yes Causey, Charlestine Massed, NP  atorvastatin (LIPITOR) 20 MG tablet TAKE 1 TABLET BY MOUTH EVERY DAY Patient taking differently: Take 20 mg by mouth daily. 01/08/21  Yes Henson, Vickie L, NP-C  betamethasone dipropionate 0.05 % cream Apply 1 application topically daily as needed (irritated skin). 12/25/19  Yes [provider]  Biotin 3 MG TABS Take 3 mg by mouth daily.   Yes [provider]  cephALEXin (KEFLEX) 500 MG capsule TAKE 1 CAPSULE(500 MG) BY MOUTH THREE TIMES DAILY Patient taking differently: Take 500 mg by mouth 3 (three) times daily. 11/03/20  Yes [name], MD  Cholecalciferol (D3 ADULT PO) Take 1 capsule by mouth daily.   Yes [provider]  diazepam (VALIUM) 5 MG tablet Take 1 tablet (5 mg total) by mouth daily as needed for anxiety. 12/17/20  Yes Raulkar, Clide Deutscher, MD  diclofenac Sodium (VOLTAREN) 1 % GEL Apply 4 g topically 4 (four) times daily. 12/02/20  Yes [name], MD  docusate sodium (COLACE) 100 MG capsule Take 1 capsule (100 mg total) by mouth 3 (three) times daily. Patient taking differently: Take 100 mg by mouth 2 (two) times daily. 07/09/19  Yes Angiulli, Lavon Paganini, PA-C  DULoxetine (CYMBALTA) 60 MG capsule TAKE 1 CAPSULE(60 MG) BY MOUTH DAILY Patient taking differently: Take 60 mg by mouth daily. 01/08/21  Yes Henson, Vickie L, NP-C  Fluticasone-Salmeterol (ADVAIR) 250-50  MCG/DOSE AEPB Inhale 1 puff into the lungs every 12 (twelve) hours. 06/08/20  Yes Chesley Mires, MD  furosemide (LASIX) 40 MG tablet Take 1 tablet (40 mg total) by mouth  daily. 06/23/20  Yes Hilty, Nadean Corwin, MD  levothyroxine (SYNTHROID) 75 MCG tablet TAKE 1 TABLET(75 MCG) BY MOUTH DAILY BEFORE BREAKFAST Patient taking differently: Take 75 mcg by mouth daily before breakfast. 12/08/20  Yes Henson, Vickie L, NP-C  lidocaine (LIDODERM) 5 % UNWRAP AND APPLY 2 PATCHES TO SKIN DAILY, REMOVE AND DISCARD PATCH WITHIN 12 HOURS OR AS DIRECTED BY DOCTOR Patient taking differently: Place 1 patch onto the skin daily. 09/02/20  Yes Raulkar, Clide Deutscher, MD  magnesium gluconate (MAGONATE) 500 MG tablet Take 2 tablets (1,000 mg total) by mouth at bedtime. 08/09/19  Yes Angiulli, Lavon Paganini, PA-C  metoprolol succinate (TOPROL-XL) 25 MG 24 hr tablet Take 1 tablet (25 mg total) by mouth daily. 11/02/20  Yes Raulkar, Clide Deutscher, MD  Omega-3 Fatty Acids (OMEGA-3 FISH OIL PO) Take 1 capsule by mouth daily.   Yes [provider]  polyethylene glycol powder (GLYCOLAX/MIRALAX) 17 GM/SCOOP powder Take 17 g by mouth daily as needed for moderate constipation.    Yes [provider]  primidone (MYSOLINE) 50 MG tablet TAKE 1 TABLET BY MOUTH TWICE DAILY Patient taking differently: Take 50 mg by mouth 2 (two) times daily. 09/15/20  Yes Raulkar, Clide Deutscher, MD  Probiotic Product (PROBIOTIC DAILY PO) Take 1 tablet by mouth daily.   Yes [provider]  Propylene Glycol (SYSTANE BALANCE OP) Place 1 drop into both eyes 4 (four) times daily as needed (dry eyes).   Yes [provider]  QUEtiapine (SEROQUEL) 25 MG tablet TAKE 1 TABLET(25 MG) BY MOUTH AT BEDTIME Patient taking differently: Take 25 mg by mouth at bedtime. 09/02/20  Yes Raulkar, Clide Deutscher, MD  rOPINIRole (REQUIP) 0.5 MG tablet TAKE 1 AND 1/2 TABLETS(0.75 MG) BY MOUTH AT BEDTIME Patient taking differently: Take 0.75 mg by mouth at bedtime. 11/09/20  Yes  Tysinger, Camelia Eng, PA-C  vitamin B-12 (CYANOCOBALAMIN) 1000 MCG tablet Take 1 tablet (1,000 mcg total) by mouth daily. 08/09/19  Yes Angiulli, Lavon Paganini, PA-C  Vitamin D, Ergocalciferol, 50000 units CAPS Take 1 tablet by mouth once a week. Patient taking differently: Take 50,000 Units by mouth once a week. Sundays 12/17/20  Yes Raulkar, Clide Deutscher, MD    Inpatient Medications: Scheduled Meds:  allopurinol  300 mg Oral Daily   amitriptyline  75 mg Oral QHS   anastrozole  1 mg Oral Daily   atorvastatin  20 mg Oral Daily   cephALEXin  500 mg Oral TID   DULoxetine  60 mg Oral Daily   enoxaparin (LOVENOX) injection  40 mg Subcutaneous Q24H   levothyroxine  75 mcg Oral Q0600   metoprolol succinate  25 mg Oral Daily   mometasone-formoterol  2 puff Inhalation BID   primidone  50 mg Oral BID   QUEtiapine  25 mg Oral QHS   rOPINIRole  0.75 mg Oral QHS   vitamin B-12  1,000 mcg Oral Daily   Continuous Infusions:  PRN Meds: acetaminophen **OR** acetaminophen, diazepam, ipratropium-albuterol, oxyCODONE-acetaminophen, polyvinyl alcohol, traMADol  Allergies:    Allergies  Allergen Reactions   Ampicillin Hives and Other (See Comments)    Severe reaction in February 2017 1.5 month to have hives to go away   Gabapentin Swelling    UNSPECIFIED REACTION    Penicillins Hives and Other (See Comments)    Severe reaction in February 2017 1.5 month to have hives to go away Has patient had a PCN reaction causing immediate rash, facial/tongue/throat swelling, SOB or lightheadedness with hypotension: #  #  #  YES  #  #  #  Has patient had a PCN reaction causing severe rash involving mucus membranes or skin necrosis: No Has patient had a PCN reaction that required hospitalization No Has patient had a PCN reaction occurring within the last 10 years: No    Codeine Nausea And Vomiting   Other Itching    UNSPECIFIED Analgesics    Social History:   Social History   Socioeconomic History   Marital status:  Married    Spouse name: robert   Number of children: 3   Years of education: college   Highest education level: Not on file  Occupational History   Occupation: retired  Tobacco Use   Smoking status: Former    Packs/day: 0.50    Years: 35.00    Pack years: 17.50    Types: Cigarettes    Quit date: 05/22/2018    Years since quitting: 2.6   Smokeless tobacco: Never  Vaping Use   Vaping Use: Never used  Substance and Sexual Activity   Alcohol use: Yes    Comment: 1 glass of wine a night   Drug use: Not Currently    Comment: CBD tincture   Sexual activity: Yes    Birth control/protection: Post-menopausal  Other Topics Concern   Not on file  Social History Narrative   Lives with husband in a 2 story home.  Has 2 living children.  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  Education: college.    Right-handed.   Five cups coffee per week.   Social Determinants of Health   Financial Resource Strain: Not on file  Food Insecurity: Not on file  Transportation Needs: Not on file  Physical Activity: Not on file  Stress: Not on file  Social Connections: Not on file  Intimate Partner Violence: Not on file    Family History:    Family History  Problem Relation Age of Onset   Atrial fibrillation Mother    Ulcers Father    Breast cancer Maternal Aunt    Lung cancer Maternal Uncle    Stomach cancer Maternal Grandmother    Lung cancer Maternal Aunt    Brain cancer Maternal Aunt    Prostate cancer Maternal Grandfather    Stroke Paternal Grandmother    Tuberculosis Paternal Grandfather    Colon cancer Neg Hx    Esophageal cancer Neg Hx    Rectal cancer Neg Hx      ROS:  Vitals:  Vitals:   01/21/21 1105 01/21/21 1547  BP: (!) 149/82 126/65  Pulse: 98 92  Resp: 15 15  Temp: 98.1 F (36.7 C) 98.2 F (36.8 C)  SpO2: 90% 95%   Constitutional: Denied fever, chills, malaise, night sweats Eyes: Denied vision change or loss Ears/Nose/Mouth/Throat: Denied ear ache, sore  throat, sinus pain Cardiovascular: denied chest pain/pressure Respiratory: see HPI  Gastrointestinal: Denied nausea, vomiting, abdominal pain, diarrhea Genital/Urinary: + urinary frequency/urgency Musculoskeletal: global weakness Skin: Denied rash, wound Neuro: Denied headache, dizziness, syncope Psych: history of depression/anxiety  Endocrine: Denied history of diabetes    Physical Exam/Data:   Vitals:   01/21/21 0731 01/21/21 0832 01/21/21 1105 01/21/21 1547  BP: 133/72  (!) 149/82 126/65  Pulse: 87 96 98 92  Resp: 16 (!) 21 15 15   Temp: 98 F (36.7 C)  98.1 F (36.7 C) 98.2 F (36.8 C)  TempSrc: Oral  Oral Oral  SpO2: 96% 95% 90% 95%  Weight:      Height:  Intake/Output Summary (Last 24 hours) at 01/21/2021 1620 Last data filed at 01/21/2021 0900 Gross per 24 hour  Intake 360 ml  Output 300 ml  Net 60 ml   Last 3 Weights 01/21/2021 01/20/2021 01/18/2021  Weight (lbs) 277 lb 9 oz 278 lb 10.6 oz 282 lb 3.2 oz  Weight (kg) 125.9 kg 126.4 kg 128.005 kg     Body mass index is 43.47 kg/m.   Vitals:  Vitals:   01/21/21 1105 01/21/21 1547  BP: (!) 149/82 126/65  Pulse: 98 92  Resp: 15 15  Temp: 98.1 F (36.7 C) 98.2 F (36.8 C)  SpO2: 90% 95%   General Appearance: In no apparent distress, laying in bed, obese HEENT: Normocephalic, atraumatic.  Neck: Supple, trachea midline, no JVDs Cardiovascular: Regular rate and rhythm, normal S1-S2,  no murmur/rub/gallop Respiratory: Resting breathing unlabored, lungs sounds clear to auscultation bilaterally, no use of accessory muscles. On 2LNC.  Gastrointestinal: Bowel sounds positive, abdomen soft, non-tender, non-distended.  Extremities: Able to move all extremities in bed without difficulty, no edema/cyanosis/clubbing Genitourinary:  genital exam not performed Musculoskeletal: Normal muscle bulk and tone, global weakness Skin: Intact, warm, dry. No rashes or petechiae noted in exposed areas.  Neurologic: Alert,  oriented to person, place and time. Fluent speech, no cognitive deficit, no gross focal neuro deficit Psychiatric: Normal affect. Mood is appropriate.   EKG:  The EKG was personally reviewed and demonstrates:    EKG from 01/18/21 13:42 showed ST 113 bpm, TWI of lateral leads appears old, no acute changes.  Telemetry:  Telemetry was personally reviewed and demonstrates:    Sinus rhythm with tachycardia episodes 80-120s, occasional PVCs   Relevant CV Studies:  Echo from 01/20/21:   1. Limited echo for LV function   2. Left ventricular ejection fraction, by estimation, is 60 to 65%. The  left ventricle has normal function. There is mild left ventricular  hypertrophy. Left ventricular diastolic parameters are consistent with  Grade I diastolic dysfunction (impaired  relaxation).   3. Aortic dilatation noted. There is mild dilatation of the ascending  aorta, measuring 41 mm.   4. The inferior vena cava is dilated in size with >50% respiratory  variability, suggesting right atrial pressure of 8 mmHg.   Comparison(s): Changes from prior study are noted. 07/07/2020: LVEF 65-70%.    NM stress myovue from 01/07/21:    The study is normal. The study is low risk.   No ST deviation was noted.   LV perfusion is normal.   Left ventricular function is normal. Nuclear stress EF: 60 %. End diastolic cavity size is normal.   Prior study available for comparison from 01/22/2016.   Low risk stress nuclear study with normal perfusion and normal left ventricular regional and global systolic function.   Echo from 07/07/20:   1. Left ventricular ejection fraction, by estimation, is 65 to 70%. The  left ventricle has normal function. The left ventricle has no regional  wall motion abnormalities. There is moderate concentric left ventricular  hypertrophy. Left ventricular  diastolic parameters are consistent with Grade I diastolic dysfunction  (impaired relaxation).   2. Right ventricular systolic  function is normal. The right ventricular  size is normal.   3. The mitral valve is grossly normal. No evidence of mitral valve  regurgitation.   4. The aortic valve was not well visualized. Aortic valve regurgitation  is not visualized.   5. Aortic dilatation noted. There is mild dilatation of the ascending  aorta, measuring 39  mm.     Laboratory Data:  High Sensitivity Troponin:   Recent Labs  Lab 01/18/21 1348  TROPONINIHS 11     Chemistry Recent Labs  Lab 01/19/21 1500 01/19/21 1604 01/20/21 1237 01/21/21 0436  NA 138  --  137 137  K 4.5  --  4.2 4.2  CL 102  --  101 100  CO2 27  --  25 27  GLUCOSE 194*  --  165* 184*  BUN 29*  --  29* 41*  CREATININE 1.12*  --  1.13* 1.27*  CALCIUM 9.3  --  9.7 9.6  MG  --  2.4  --   --   GFRNONAA 52*  --  51* 44*  ANIONGAP 9  --  11 10    Recent Labs  Lab 01/19/21 1500  ALBUMIN 3.7   Lipids No results for input(s): CHOL, TRIG, HDL, LABVLDL, LDLCALC, CHOLHDL in the last 168 hours.  Hematology Recent Labs  Lab 01/19/21 0403 01/20/21 0404 01/21/21 0436  WBC 10.9* 9.2 8.8  RBC 3.71* 4.06 3.76*  HGB 12.1 13.4 12.1  HCT 37.0 40.0 37.7  MCV 99.7 98.5 100.3*  MCH 32.6 33.0 32.2  MCHC 32.7 33.5 32.1  RDW 14.7 14.8 14.6  PLT 198 246 201   Thyroid  Recent Labs  Lab 01/19/21 0403  TSH 4.495    BNP Recent Labs  Lab 01/18/21 1348  BNP 46.5    DDimer  Recent Labs  Lab 01/18/21 1348  DDIMER 0.84*     Radiology/Studies:  DG Chest 1 View  Result Date: 01/18/2021 CLINICAL DATA:  Shortness of breath. EXAM: CHEST  1 VIEW COMPARISON:  Chest x-ray 05/20/2020. FINDINGS: Heart is mildly enlarged. There is no pleural effusion or pneumothorax. There is some strandy interstitial opacities in the left lower lung. Surgical clips are seen in the left axilla. No acute fractures are identified. IMPRESSION: 1. Minimal opacities in the left lower lung may represent atelectasis/scarring or mild infection. 2. Stable cardiomegaly.  Electronically Signed   By: Ronney Asters M.D.   On: 01/18/2021 16:01   CT Angio Chest PE W and/or Wo Contrast  Result Date: 01/18/2021 CLINICAL DATA:  Decreased oxygen saturation. EXAM: CT ANGIOGRAPHY CHEST WITH CONTRAST TECHNIQUE: Multidetector CT imaging of the chest was performed using the standard protocol during bolus administration of intravenous contrast. Multiplanar CT image reconstructions and MIPs were obtained to evaluate the vascular anatomy. CONTRAST:  53mL OMNIPAQUE IOHEXOL 350 MG/ML SOLN COMPARISON:  May 22, 2020 FINDINGS: Cardiovascular: There is mild calcification of the aortic arch. The subsegmental pulmonary arteries are limited in evaluation secondary to areas of overlying artifact and suboptimal opacification with intravenous contrast. An ill-defined area of low attenuation is seen involving a lower lobe branch of the right pulmonary artery without definite evidence of intraluminal filling defects. Normal heart size. No pericardial effusion. Mediastinum/Nodes: No enlarged mediastinal, hilar, or axillary lymph nodes. Thyroid gland, trachea, and esophagus demonstrate no significant findings. Lungs/Pleura: Mild atelectasis is seen within the bilateral lung bases. There is no evidence of a pleural effusion or pneumothorax. Upper Abdomen: No acute abnormality. Musculoskeletal: No chest wall abnormality. No acute or significant osseous findings. Review of the MIP images confirms the above findings. IMPRESSION: 1. Ill-defined area of low attenuation involving a lower lobe branch of the right pulmonary artery without definite evidence of intraluminal filling defects. A small amount of pulmonary embolism can not completely be excluded. Further evaluation with a nuclear medicine ventilation/perfusion scan is recommended. 2. Mild bibasilar  atelectasis. 3. Aortic atherosclerosis. Aortic Atherosclerosis (ICD10-I70.0). Electronically Signed   By: Virgina Norfolk M.D.   On: 01/18/2021 20:21   NM  Pulmonary Perfusion  Result Date: 01/19/2021 CLINICAL DATA:  Decreased oxygen saturation. Shortness of breath. Increased heart rate. Patient underwent a CTA chest yesterday, findings with a possible small embolus a segmental right lower lobe branch. EXAM: NUCLEAR MEDICINE PERFUSION LUNG SCAN TECHNIQUE: Perfusion images were obtained in multiple projections after intravenous injection of radiopharmaceutical. Ventilation scans intentionally deferred if perfusion scan and chest x-ray adequate for interpretation during COVID 19 epidemic. RADIOPHARMACEUTICALS:  4.4 mCi Tc-4m MAA IV COMPARISON:  CTA chest and chest radiographs from 01/18/2021. FINDINGS: There is nonsegmental patchy decreased perfusion to the middle and upper lungs bilaterally most consistent with emphysema and/or shunting firm small airways disease. There are no convincing segmental defects to suggest pulmonary thromboembolism. IMPRESSION: 1. Low probability study for pulmonary thromboembolism. Areas of nonsegmental decreased perfusion predominate in upper lungs consistent with changes from emphysema and/or small airways disease. Electronically Signed   By: Lajean Manes M.D.   On: 01/19/2021 11:49   VAS [place] LOWER EXTREMITY VENOUS (DVT)  Result Date: 01/19/2021  Lower Venous DVT Study Patient Name:  MAAME DACK  Date of Exam:   01/19/2021 Medical Rec #: [id number]         Accession #:    [id number] Date of Birth: 06/19/1945         Patient Gender: F Patient Age:   68 years Exam Location:  [facility] Procedure:      VAS [place] LOWER EXTREMITY VENOUS (DVT) Referring Phys: Wandra Feinstein RATHORE --------------------------------------------------------------------------------  Indications: Swelling, and Edema.  Comparison Study: 05/12/20 prior Performing Technologist: Archie Patten RVS  Examination Guidelines: A complete evaluation includes B-mode imaging, spectral Doppler, color Doppler, and power Doppler as needed of all accessible portions of each  vessel. Bilateral testing is considered an integral part of a complete examination. Limited examinations for reoccurring indications may be performed as noted. The reflux portion of the exam is performed with the patient in reverse Trendelenburg.  +---------+---------------+---------+-----------+----------+--------------+ RIGHT    CompressibilityPhasicitySpontaneityPropertiesThrombus Aging +---------+---------------+---------+-----------+----------+--------------+ CFV      Full           Yes      Yes                                 +---------+---------------+---------+-----------+----------+--------------+ SFJ      Full                                                        +---------+---------------+---------+-----------+----------+--------------+ FV Prox  Full                                                        +---------+---------------+---------+-----------+----------+--------------+ FV Mid   Full                                                        +---------+---------------+---------+-----------+----------+--------------+  FV DistalFull                                                        +---------+---------------+---------+-----------+----------+--------------+ PFV      Full                                                        +---------+---------------+---------+-----------+----------+--------------+ POP      Full           Yes      Yes                                 +---------+---------------+---------+-----------+----------+--------------+ PTV      Full                                                        +---------+---------------+---------+-----------+----------+--------------+ PERO     Full                                                        +---------+---------------+---------+-----------+----------+--------------+   +---------+---------------+---------+-----------+----------+--------------+ LEFT      CompressibilityPhasicitySpontaneityPropertiesThrombus Aging +---------+---------------+---------+-----------+----------+--------------+ CFV      Full           Yes      Yes                                 +---------+---------------+---------+-----------+----------+--------------+ SFJ      Full                                                        +---------+---------------+---------+-----------+----------+--------------+ FV Prox  Full                                                        +---------+---------------+---------+-----------+----------+--------------+ FV Mid   Full                                                        +---------+---------------+---------+-----------+----------+--------------+ FV DistalFull                                                        +---------+---------------+---------+-----------+----------+--------------+  PFV      Full                                                        +---------+---------------+---------+-----------+----------+--------------+ POP      Full           Yes      Yes                                 +---------+---------------+---------+-----------+----------+--------------+ PTV      Full                                                        +---------+---------------+---------+-----------+----------+--------------+ PERO     Full                                                        +---------+---------------+---------+-----------+----------+--------------+     Summary: BILATERAL: - No evidence of deep vein thrombosis seen in the lower extremities, bilaterally. -No evidence of popliteal cyst, bilaterally.   *See table(s) above for measurements and observations. Electronically signed by Deitra Mayo MD on 01/19/2021 at 5:39:35 PM.    Final    ECHOCARDIOGRAM LIMITED  Result Date: 01/20/2021    ECHOCARDIOGRAM LIMITED REPORT   Patient Name:   RAYLEE STREHL Date of Exam: 01/20/2021  Medical Rec #:  [id number]        Height:       67.0 in Accession #:    [id number]       Weight:       278.7 lb Date of Birth:  [birth year]        BSA:          2.328 m Patient Age:    70 years         BP:           130/77 mmHg Patient Gender: F                HR:           95 bpm. Exam Location:  Inpatient Procedure: Limited Echo, 3D Echo, Cardiac Doppler and Color Doppler Indications:    R06.02 SOB  History:        Patient has prior history of Echocardiogram examinations, most                 recent 07/07/2020. Abnormal ECG, COPD; Risk Factors:Hypertension                 and Current Smoker. ETOH. Breast cancer.  Sonographer:    Roseanna Rainbow RDCS Referring Phys: [id number] [facility]  Sonographer Comments: Technically difficult study due to poor echo windows and patient is morbidly obese. Image acquisition challenging due to patient body habitus. IMPRESSIONS  1. Limited echo for LV function  2. Left ventricular ejection fraction, by estimation, is 60 to 65%. The left ventricle has normal function. There is mild left ventricular hypertrophy.  Left ventricular diastolic parameters are consistent with Grade I diastolic dysfunction (impaired relaxation).  3. Aortic dilatation noted. There is mild dilatation of the ascending aorta, measuring 41 mm.  4. The inferior vena cava is dilated in size with >50% respiratory variability, suggesting right atrial pressure of 8 mmHg. Comparison(s): Changes from prior study are noted. 07/07/2020: LVEF 65-70%. FINDINGS  Left Ventricle: Left ventricular ejection fraction, by estimation, is 60 to 65%. The left ventricle has normal function. The left ventricular internal cavity size was normal in size. There is mild left ventricular hypertrophy. Left ventricular diastolic  parameters are consistent with Grade I diastolic dysfunction (impaired relaxation). Indeterminate filling pressures. Aorta: Aortic dilatation noted. There is mild dilatation of the ascending aorta, measuring 41 mm. Venous: The  inferior vena cava is dilated in size with greater than 50% respiratory variability, suggesting right atrial pressure of 8 mmHg. LEFT VENTRICLE PLAX 2D LVIDd:         4.85 cm LVIDs:         3.10 cm LV PW:         1.20 cm LV IVS:        1.30 cm LVOT diam:     2.20 cm LVOT Area:     3.80 cm  LV Volumes (MOD) LV vol d, MOD A2C: 60.9 ml LV vol d, MOD A4C: 83.4 ml LV vol s, MOD A2C: 24.3 ml LV vol s, MOD A4C: 32.9 ml LV SV MOD A2C:     36.6 ml LV SV MOD A4C:     83.4 ml LV SV MOD BP:      42.4 ml LEFT ATRIUM         Index LA diam:    3.30 cm 1.42 cm/m   AORTA Ao Root diam: 3.70 cm Ao Asc diam:  4.10 cm MITRAL VALVE MV Area (PHT): 4.15 cm    SHUNTS MV Decel Time: 183 msec    Systemic Diam: 2.20 cm MV E velocity: 46.30 cm/s MV A velocity: 92.10 cm/s MV E/A ratio:  0.50 Lyman Bishop MD Electronically signed by Lyman Bishop MD Signature Date/Time: 01/20/2021/1:41:02 PM    Final      Assessment and Plan:   Acute hypoxic respiratory failure  - unclear if patient had acute CHF based on chart review, BNP and chest imaging at admission were not impressive for CHF, consider other etiology such as atelectasis, OHS, COPD, deconditioning, anxiety   Chronic diastolic heart failure  - she has chronic DOE due to decondition and LE edema due to venous insufficiency  - BNP WNL although limited diagnostic value due to obesity, POA - Chest CT without overt evidence of CHF, POA - Echo from 10/5 without significant change  - weight was 278.66 ib at admission, it was 287 ibs on 12/23/20 office visit  - unclear if she was in decompensated CHF at admission - clinically euvolemic today  - would hold further lasix at this time for 24 -48 hours to avoid over diuresis, trend BMP  - resume home dose PO Lasix 40mg  daily if euvolemic at discharge   HTN - BP controlled on metoprolol   COPD Hypothyroidism Obesity  CKD III Debility  Anxiety with depression RLS - addressed by IM     Risk Assessment/Risk Scores:    [place] Heart Association (NYHA) Functional Class NYHA Class II        For questions or updates, please contact [facility] Please consult [email or web address] for contact info under  Signed, Margie Billet, NP  01/21/2021 4:20 PM

## 2021-01-21 NOTE — Progress Notes (Signed)
PROGRESS NOTE    Brandi Stephens  YWV:[id number] DOB: 1945/10/18 DOA: 01/18/2021 PCP: Girtha Rm, NP-C    Chief Complaint  Patient presents with   Follow-up    Brief Narrative:  75 year old F with PMH of diastolic CHF, COPD not on O2, CKD-3A, HTN, HLD, hypothyroidism, morbid obesity, breast cancer s/p left breast lumpectomy, radiation and hormonal therapy, anxiety, depression and RLS sent to ED from PCP office with hypoxemia and tachycardia.  She went to see PCP for lower extremity edema.  She was admitted with working diagnosis of possible PE that could not be excluded by CTA chest.  She was started on IV heparin.  VQ scan and LE venous Doppler were ordered.   The next day, VQ scan and LE venous US negative for VTE.  Heparin discontinued.  She was ambulated and desaturated to 79% on RA.  Started on IV Lasix with improvement in the breathing and pedal edema.  Pt seen this am, and requested for strict intake and output.    Assessment & Plan:   Principal Problem:   Acute hypoxemic respiratory failure (HCC) Active Problems:   Essential hypertension   COPD (chronic obstructive pulmonary disease) (HCC)   Prediabetes   CKD (chronic kidney disease), stage III   Acute respiratory failure with hypoxia (HCC)  Acute respiratory failure with hypoxia probably secondary to combination of acute on chronic diastolic heart failure in the setting of emphysema/COPD. Improving with IV lasix. So far she has received about 2 doses Echocardiogram showed Left ventricular ejection fraction is 60 to 65% with normal function. There is mild left ventricular hypertrophy. Left ventricular diastolic parameters are consistent with Grade I diastolic dysfunction (impaired relaxation). Aortic dilatation noted. There is mild dilatation of the ascending aorta.  PE is ruled out by CTA of the chest and VQ scan.  Currently on 1 to 2 L of nasal cannula oxygen to keep sats greater than 90% Unable to wean her off the  oxygen today.  Creatinine slightly worse with IV lasix 40 mg daily. Will decrease the lasix to 20 mg . Will request cardiology for further input.    Chronic COPD Continue with Dulera and duo nebs as needed.   Stage IIIa CKD Baseline creatinine around 1.1.  Creatinine slightly elevated at 1.2. continue to monitor.     History of gout Continue with allopurinol.    Hypertension Blood pressure parameters appear to be optimal Resume home medications.   Hypothyroidism Continue with Synthroid.  Back pain;  S/p posterior fusion surgery at L2  to S1. Complicated by paraspinal abscesses.  On keflex for chronic suppression.  Follows up with Dr Megan Salon with ID as outpatient.    History of breast cancer s/p left lumpectomy in 2017, radiation and hormonal therapy Continue with home anastrozole.  Conditioning/debility Therapy evaluation recommending home health PT on discharge.  DVT prophylaxis:Lovenox.  Code Status:  full code.  Family Communication: none at bedside.  Disposition:   Status is: Observation  The patient will require care spanning > 2 midnights and should be moved to inpatient because: IV treatments appropriate due to intensity of illness or inability to take PO  Dispo: The patient is from: Home              Anticipated d/c is to: Home              Patient currently is not medically stable to d/c.   Difficult to place patient No       Consultants:  None.   Procedures: none.   Antimicrobials: none.    Subjective: Improving, no chest pain.  Unable to wean her off the oxygen.   Objective: Vitals:   01/21/21 0300 01/21/21 0731 01/21/21 0832 01/21/21 1105  BP:  133/72  (!) 149/82  Pulse:  87 96 98  Resp:  16 (!) 21 15  Temp:  98 F (36.7 C)  98.1 F (36.7 C)  TempSrc:  Oral  Oral  SpO2:  96% 95% 90%  Weight: 125.9 kg     Height:        Intake/Output Summary (Last 24 hours) at 01/21/2021 1243 Last data filed at 01/21/2021 0900 Gross per  24 hour  Intake 360 ml  Output 300 ml  Net 60 ml    Filed Weights   01/20/21 0359 01/21/21 0300  Weight: 126.4 kg 125.9 kg    Examination: General exam: Appears calm and comfortable  Respiratory system: Clear to auscultation. Respiratory effort normal. On 2 lit of [place] oxygen.  Cardiovascular system: S1 & S2 heard, RRR. No JVD,  improving pedal edema.  Gastrointestinal system: Abdomen is nondistended, soft and nontender.  Normal bowel sounds heard. Central nervous system: Alert and oriented. No focal neurological deficits. Extremities: Symmetric 5 x 5 power. Skin: No rashes, lesions or ulcers Psychiatry:  Mood & affect appropriate.      Data Reviewed: I have personally reviewed following labs and imaging studies  CBC: Recent Labs  Lab 01/18/21 1348 01/19/21 0403 01/20/21 0404 01/21/21 0436  WBC 12.8* 10.9* 9.2 8.8  NEUTROABS 9.9*  --   --   --   HGB 14.0 12.1 13.4 12.1  HCT 42.6 37.0 40.0 37.7  MCV 98.6 99.7 98.5 100.3*  PLT 250 198 246 201     Basic Metabolic Panel: Recent Labs  Lab 01/18/21 1348 01/19/21 1500 01/19/21 1604 01/20/21 1237 01/21/21 0436  NA 137 138  --  137 137  K 4.9 4.5  --  4.2 4.2  CL 100 102  --  101 100  CO2 25 27  --  25 27  GLUCOSE 170* 194*  --  165* 184*  BUN 25* 29*  --  29* 41*  CREATININE 1.30* 1.12*  --  1.13* 1.27*  CALCIUM 10.0 9.3  --  9.7 9.6  MG  --   --  2.4  --   --   PHOS  --  4.3  --   --   --      GFR: Estimated Creatinine Clearance: 53.6 mL/min (A) (by C-G formula based on SCr of 1.27 mg/dL (H)).  Liver Function Tests: Recent Labs  Lab 01/19/21 1500  ALBUMIN 3.7     CBG: No results for input(s): GLUCAP in the last 168 hours.   Recent Results (from the past 240 hour(s))  Resp Panel by RT-PCR (Flu A&B, Covid) Nasopharyngeal Swab     Status: None   Collection Time: 01/18/21  8:46 PM   Specimen: Nasopharyngeal Swab; Nasopharyngeal(NP) swabs in vial transport medium  Result Value Ref Range Status    SARS Coronavirus 2 by RT PCR NEGATIVE NEGATIVE Final    Comment: (NOTE) SARS-CoV-2 target nucleic acids are NOT DETECTED.  The SARS-CoV-2 RNA is generally detectable in upper respiratory specimens during the acute phase of infection. The lowest concentration of SARS-CoV-2 viral copies this assay can detect is 138 copies/mL. A negative result does not preclude SARS-Cov-2 infection and should not be used as the sole basis for treatment or other patient management  decisions. A negative result may occur with  improper specimen collection/handling, submission of specimen other than nasopharyngeal swab, presence of viral mutation(s) within the areas targeted by this assay, and inadequate number of viral copies(<138 copies/mL). A negative result must be combined with clinical observations, patient history, and epidemiological information. The expected result is Negative.  Fact Sheet for Patients:  EntrepreneurPulse.com.au  Fact Sheet for Healthcare Providers:  IncredibleEmployment.be  This test is no t yet approved or cleared by the [place] FDA and  has been authorized for detection and/or diagnosis of SARS-CoV-2 by FDA under an Emergency Use Authorization (EUA). This EUA will remain  in effect (meaning this test can be used) for the duration of the COVID-19 declaration under Section 564(b)(1) of the Act, 21 U.S.C.section 360bbb-3(b)(1), unless the authorization is terminated  or revoked sooner.       Influenza A by PCR NEGATIVE NEGATIVE Final   Influenza B by PCR NEGATIVE NEGATIVE Final    Comment: (NOTE) The Xpert Xpress SARS-CoV-2/FLU/RSV plus assay is intended as an aid in the diagnosis of influenza from Nasopharyngeal swab specimens and should not be used as a sole basis for treatment. Nasal washings and aspirates are unacceptable for Xpert Xpress SARS-CoV-2/FLU/RSV testing.  Fact Sheet for  Patients: EntrepreneurPulse.com.au  Fact Sheet for Healthcare Providers: IncredibleEmployment.be  This test is not yet approved or cleared by the [place] FDA and has been authorized for detection and/or diagnosis of SARS-CoV-2 by FDA under an Emergency Use Authorization (EUA). This EUA will remain in effect (meaning this test can be used) for the duration of the COVID-19 declaration under Section 564(b)(1) of the Act, 21 U.S.C. section 360bbb-3(b)(1), unless the authorization is terminated or revoked.  Performed at [facility], [place] [address] [place], [place] [address]   MRSA Next Gen by PCR, Nasal     Status: None   Collection Time: 01/20/21  4:00 AM   Specimen: Nasal Mucosa; Nasal Swab  Result Value Ref Range Status   MRSA by PCR Next Gen NOT DETECTED NOT DETECTED Final    Comment: (NOTE) The GeneXpert MRSA Assay (FDA approved for NASAL specimens only), is one component of a comprehensive MRSA colonization surveillance program. It is not intended to diagnose MRSA infection nor to guide or monitor treatment for MRSA infections. Test performance is not FDA approved in patients less than 30 years old. Performed at [facility], [place] [address] [place],  [address]           Radiology Studies: ECHOCARDIOGRAM LIMITED  Result Date: 01/20/2021    ECHOCARDIOGRAM LIMITED REPORT   Patient Name:   RENEZMAE CANLAS Date of Exam: 01/20/2021 Medical Rec #:  [id number]        Height:       67.0 in Accession #:    [id number]       Weight:       278.7 lb Date of Birth:  10/29/1945        BSA:          2.328 m Patient Age:    80 years         BP:           130/77 mmHg Patient Gender: F                HR:           95 bpm. Exam Location:  Inpatient Procedure: Limited Echo, 3D Echo, Cardiac Doppler and Color Doppler Indications:  R06.02 SOB  History:        Patient has prior history of Echocardiogram examinations, most                  recent 07/07/2020. Abnormal ECG, COPD; Risk Factors:Hypertension                 and Current Smoker. ETOH. Breast cancer.  Sonographer:    Roseanna Rainbow RDCS Referring Phys: [id number] [facility]  Sonographer Comments: Technically difficult study due to poor echo windows and patient is morbidly obese. Image acquisition challenging due to patient body habitus. IMPRESSIONS  1. Limited echo for LV function  2. Left ventricular ejection fraction, by estimation, is 60 to 65%. The left ventricle has normal function. There is mild left ventricular hypertrophy. Left ventricular diastolic parameters are consistent with Grade I diastolic dysfunction (impaired relaxation).  3. Aortic dilatation noted. There is mild dilatation of the ascending aorta, measuring 41 mm.  4. The inferior vena cava is dilated in size with >50% respiratory variability, suggesting right atrial pressure of 8 mmHg. Comparison(s): Changes from prior study are noted. 07/07/2020: LVEF 65-70%. FINDINGS  Left Ventricle: Left ventricular ejection fraction, by estimation, is 60 to 65%. The left ventricle has normal function. The left ventricular internal cavity size was normal in size. There is mild left ventricular hypertrophy. Left ventricular diastolic  parameters are consistent with Grade I diastolic dysfunction (impaired relaxation). Indeterminate filling pressures. Aorta: Aortic dilatation noted. There is mild dilatation of the ascending aorta, measuring 41 mm. Venous: The inferior vena cava is dilated in size with greater than 50% respiratory variability, suggesting right atrial pressure of 8 mmHg. LEFT VENTRICLE PLAX 2D LVIDd:         4.85 cm LVIDs:         3.10 cm LV PW:         1.20 cm LV IVS:        1.30 cm LVOT diam:     2.20 cm LVOT Area:     3.80 cm  LV Volumes (MOD) LV vol d, MOD A2C: 60.9 ml LV vol d, MOD A4C: 83.4 ml LV vol s, MOD A2C: 24.3 ml LV vol s, MOD A4C: 32.9 ml LV SV MOD A2C:     36.6 ml LV SV MOD A4C:     83.4 ml LV SV MOD BP:      42.4  ml LEFT ATRIUM         Index LA diam:    3.30 cm 1.42 cm/m   AORTA Ao Root diam: 3.70 cm Ao Asc diam:  4.10 cm MITRAL VALVE MV Area (PHT): 4.15 cm    SHUNTS MV Decel Time: 183 msec    Systemic Diam: 2.20 cm MV E velocity: 46.30 cm/s MV A velocity: 92.10 cm/s MV E/A ratio:  0.50 Lyman Bishop MD Electronically signed by Lyman Bishop MD Signature Date/Time: 01/20/2021/1:41:02 PM    Final         Scheduled Meds:  allopurinol  300 mg Oral Daily   amitriptyline  75 mg Oral QHS   anastrozole  1 mg Oral Daily   atorvastatin  20 mg Oral Daily   cephALEXin  500 mg Oral TID   DULoxetine  60 mg Oral Daily   enoxaparin (LOVENOX) injection  40 mg Subcutaneous Q24H   levothyroxine  75 mcg Oral Q0600   metoprolol succinate  25 mg Oral Daily   mometasone-formoterol  2 puff Inhalation BID   primidone  50  mg Oral BID   QUEtiapine  25 mg Oral QHS   rOPINIRole  0.75 mg Oral QHS   vitamin B-12  1,000 mcg Oral Daily   Continuous Infusions:   LOS: 1 day        Hosie Poisson, MD [facility]   To contact the attending provider between 7A-7P or the covering provider during after hours 7P-7A, please log into the web site [email or web address] and access using universal [place] password for that web site. If you do not have the password, please call the hospital operator.  01/21/2021, 12:43 PM

## 2021-01-21 NOTE — Progress Notes (Signed)
Patient sats at 84% upon standing on room air, was not able to ambulate outside room without 2L oxygen which brought her to 92%, only able to ambulate to door of room---she will try walking again with PT tomorrow 01/22/21

## 2021-01-22 LAB — BASIC METABOLIC PANEL
Anion gap: 11 (ref 5–15)
BUN: 36 mg/dL — ABNORMAL HIGH (ref 8–23)
CO2: 24 mmol/L (ref 22–32)
Calcium: 9.6 mg/dL (ref 8.9–10.3)
Chloride: 100 mmol/L (ref 98–111)
Creatinine, Ser: 1.16 mg/dL — ABNORMAL HIGH (ref 0.44–1.00)
GFR, Estimated: 49 mL/min — ABNORMAL LOW (ref >60)
Glucose, Bld: 169 mg/dL — ABNORMAL HIGH (ref 70–99)
Potassium: 4.1 mmol/L (ref 3.5–5.1)
Sodium: 135 mmol/L (ref 135–145)

## 2021-01-22 LAB — CBC
HCT: 37.2 % (ref 36.0–46.0)
Hemoglobin: 12.6 g/dL (ref 12.0–15.0)
MCH: 32.8 pg (ref 26.0–34.0)
MCHC: 33.9 g/dL (ref 30.0–36.0)
MCV: 96.9 fL (ref 80.0–100.0)
Platelets: 202 10*3/uL (ref 150–400)
RBC: 3.84 MIL/uL — ABNORMAL LOW (ref 3.87–5.11)
RDW: 14.5 % (ref 11.5–15.5)
WBC: 9.3 10*3/uL (ref 4.0–10.5)
nRBC: 0 % (ref 0.0–0.2)

## 2021-01-22 NOTE — Progress Notes (Signed)
PROGRESS NOTE    Brandi Stephens  ZOX:[id number] DOB: 01-14-1946 DOA: 01/18/2021 PCP: Girtha Rm, NP-C    Chief Complaint  Patient presents with   Follow-up    Brief Narrative:  75 year old F with PMH of diastolic CHF, COPD not on O2, CKD-3A, HTN, HLD, hypothyroidism, morbid obesity, breast cancer s/p left breast lumpectomy, radiation and hormonal therapy, anxiety, depression and RLS sent to ED from PCP office with hypoxemia and tachycardia.  She went to see PCP for lower extremity edema.  She was admitted with working diagnosis of possible PE that could not be excluded by CTA chest.  She was started on IV heparin.  VQ scan and LE venous Doppler were ordered.   The next day, VQ scan and LE venous US negative for VTE.  Heparin discontinued.  She was ambulated and desaturated to 79% on RA.  Started on IV Lasix with improvement in the breathing and pedal edema.  Pt continues to remain on [place] oxygen upto 3l it / sob on exertion.    Assessment & Plan:   Principal Problem:   Acute hypoxemic respiratory failure (HCC) Active Problems:   Essential hypertension   COPD (chronic obstructive pulmonary disease) (HCC)   Prediabetes   CKD (chronic kidney disease), stage III   Inappropriate sinus tachycardia   Muscular deconditioning   Acute respiratory failure with hypoxia (HCC)   Morbid obesity (HCC)  Acute respiratory failure with hypoxia probably secondary to combination of acute on chronic diastolic heart failure in the setting of emphysema/COPD. Improving with IV lasix. So far she has received about 2 doses Echocardiogram showed Left ventricular ejection fraction is 60 to 65% with normal function. There is mild left ventricular hypertrophy. Left ventricular diastolic parameters are consistent with Grade I diastolic dysfunction (impaired relaxation). Aortic dilatation noted. There is mild dilatation of the ascending aorta.  PE is ruled out by CTA of the chest and VQ scan.  Currently on  1 to 3 L of nasal cannula oxygen to keep sats greater than 90% Creatinine appears to be  back to baseline.  Cardiology consulted , transitioned to bumex as she is not responding to oral lasix at home .    Chronic COPD Continue with Dulera and duo nebs as needed.   Stage IIIa CKD Baseline creatinine around 1.1. stable    History of gout Continue with allopurinol.   Hypertension Blood pressure parameters appear to be optimal.  Continue with metoprolol.    Hypothyroidism Continue with Synthroid.  Back pain;  S/p posterior fusion surgery at L2  to S1. Complicated by paraspinal abscesses.  On keflex for chronic suppression.  Follows up with Dr Megan Salon with ID as outpatient.    History of breast cancer s/p left lumpectomy in 2017, radiation and hormonal therapy Continue with home anastrozole.  Conditioning/debility Therapy evaluation recommending home health PT on discharge.  DVT prophylaxis:Lovenox.  Code Status:  full code.  Family Communication: none at bedside.  Disposition:   Status is: Observation  The patient will require care spanning > 2 midnights and should be moved to inpatient because: IV treatments appropriate due to intensity of illness or inability to take PO  Dispo: The patient is from: Home              Anticipated d/c is to: Home              Patient currently is not medically stable to d/c.   Difficult to place patient No  Consultants:  Cardiology.   Procedures: none.   Antimicrobials: none.    Subjective: Still sob, but improving.  Pedal edema is improving.   Objective: Vitals:   01/22/21 0301 01/22/21 0710 01/22/21 0830 01/22/21 1121  BP: (!) 138/97 128/79  108/69  Pulse: 100 93 96 96  Resp: 20 (!) 21 18 13   Temp: 98.2 F (36.8 C) 98.2 F (36.8 C)  98 F (36.7 C)  TempSrc: Oral Oral  Oral  SpO2:  93% 91% 93%  Weight: 125.9 kg     Height:        Intake/Output Summary (Last 24 hours) at 01/22/2021 1627 Last data  filed at 01/22/2021 1212 Gross per 24 hour  Intake --  Output 800 ml  Net -800 ml    Filed Weights   01/20/21 0359 01/21/21 0300 01/22/21 0301  Weight: 126.4 kg 125.9 kg 125.9 kg    Examination:  General exam: Appears calm and comfortable  Respiratory system: diminished air entry at bases, on 3 lit of [place] oxygen.  Cardiovascular system: S1 & S2 heard, RRR. No JVD, . Improving pedal edema.  Gastrointestinal system: Abdomen is nondistended, soft and non tender. Normal bowel sounds heard. Central nervous system: Alert and oriented. No focal neurological deficits. Extremities: Symmetric 5 x 5 power. Skin: No rashes, lesions or ulcers Psychiatry: Mood & affect appropriate.     Data Reviewed: I have personally reviewed following labs and imaging studies  CBC: Recent Labs  Lab 01/18/21 1348 01/19/21 0403 01/20/21 0404 01/21/21 0436 01/22/21 0128  WBC 12.8* 10.9* 9.2 8.8 9.3  NEUTROABS 9.9*  --   --   --   --   HGB 14.0 12.1 13.4 12.1 12.6  HCT 42.6 37.0 40.0 37.7 37.2  MCV 98.6 99.7 98.5 100.3* 96.9  PLT 250 198 246 201 202     Basic Metabolic Panel: Recent Labs  Lab 01/18/21 1348 01/19/21 1500 01/19/21 1604 01/20/21 1237 01/21/21 0436 01/22/21 0936  NA 137 138  --  137 137 135  K 4.9 4.5  --  4.2 4.2 4.1  CL 100 102  --  101 100 100  CO2 25 27  --  25 27 24   GLUCOSE 170* 194*  --  165* 184* 169*  BUN 25* 29*  --  29* 41* 36*  CREATININE 1.30* 1.12*  --  1.13* 1.27* 1.16*  CALCIUM 10.0 9.3  --  9.7 9.6 9.6  MG  --   --  2.4  --   --   --   PHOS  --  4.3  --   --   --   --      GFR: Estimated Creatinine Clearance: 58.6 mL/min (A) (by C-G formula based on SCr of 1.16 mg/dL (H)).  Liver Function Tests: Recent Labs  Lab 01/19/21 1500  ALBUMIN 3.7     CBG: No results for input(s): GLUCAP in the last 168 hours.   Recent Results (from the past 240 hour(s))  Resp Panel by RT-PCR (Flu A&B, Covid) Nasopharyngeal Swab     Status: None   Collection Time:  01/18/21  8:46 PM   Specimen: Nasopharyngeal Swab; Nasopharyngeal(NP) swabs in vial transport medium  Result Value Ref Range Status   SARS Coronavirus 2 by RT PCR NEGATIVE NEGATIVE Final    Comment: (NOTE) SARS-CoV-2 target nucleic acids are NOT DETECTED.  The SARS-CoV-2 RNA is generally detectable in upper respiratory specimens during the acute phase of infection. The lowest concentration of SARS-CoV-2 viral copies this  assay can detect is 138 copies/mL. A negative result does not preclude SARS-Cov-2 infection and should not be used as the sole basis for treatment or other patient management decisions. A negative result may occur with  improper specimen collection/handling, submission of specimen other than nasopharyngeal swab, presence of viral mutation(s) within the areas targeted by this assay, and inadequate number of viral copies(<138 copies/mL). A negative result must be combined with clinical observations, patient history, and epidemiological information. The expected result is Negative.  Fact Sheet for Patients:  EntrepreneurPulse.com.au  Fact Sheet for Healthcare Providers:  IncredibleEmployment.be  This test is no t yet approved or cleared by the [place] FDA and  has been authorized for detection and/or diagnosis of SARS-CoV-2 by FDA under an Emergency Use Authorization (EUA). This EUA will remain  in effect (meaning this test can be used) for the duration of the COVID-19 declaration under Section 564(b)(1) of the Act, 21 U.S.C.section 360bbb-3(b)(1), unless the authorization is terminated  or revoked sooner.       Influenza A by PCR NEGATIVE NEGATIVE Final   Influenza B by PCR NEGATIVE NEGATIVE Final    Comment: (NOTE) The Xpert Xpress SARS-CoV-2/FLU/RSV plus assay is intended as an aid in the diagnosis of influenza from Nasopharyngeal swab specimens and should not be used as a sole basis for treatment. Nasal washings  and aspirates are unacceptable for Xpert Xpress SARS-CoV-2/FLU/RSV testing.  Fact Sheet for Patients: EntrepreneurPulse.com.au  Fact Sheet for Healthcare Providers: IncredibleEmployment.be  This test is not yet approved or cleared by the [place] FDA and has been authorized for detection and/or diagnosis of SARS-CoV-2 by FDA under an Emergency Use Authorization (EUA). This EUA will remain in effect (meaning this test can be used) for the duration of the COVID-19 declaration under Section 564(b)(1) of the Act, 21 U.S.C. section 360bbb-3(b)(1), unless the authorization is terminated or revoked.  Performed at [facility], [place] [address], [place], [place] [address]   MRSA Next Gen by PCR, Nasal     Status: None   Collection Time: 01/20/21  4:00 AM   Specimen: Nasal Mucosa; Nasal Swab  Result Value Ref Range Status   MRSA by PCR Next Gen NOT DETECTED NOT DETECTED Final    Comment: (NOTE) The GeneXpert MRSA Assay (FDA approved for NASAL specimens only), is one component of a comprehensive MRSA colonization surveillance program. It is not intended to diagnose MRSA infection nor to guide or monitor treatment for MRSA infections. Test performance is not FDA approved in patients less than 6 years old. Performed at [facility], [place] [address], [place], [place] [address]           Radiology Studies: No results found.      Scheduled Meds:  allopurinol  300 mg Oral Daily   amitriptyline  75 mg Oral QHS   anastrozole  1 mg Oral Daily   atorvastatin  20 mg Oral Daily   bumetanide  1 mg Oral Daily   cephALEXin  500 mg Oral TID   DULoxetine  60 mg Oral Daily   enoxaparin (LOVENOX) injection  40 mg Subcutaneous Q24H   levothyroxine  75 mcg Oral Q0600   metoprolol succinate  50 mg Oral Daily   mometasone-formoterol  2 puff Inhalation BID   primidone  50 mg Oral BID   QUEtiapine  25 mg Oral QHS   rOPINIRole  0.75 mg Oral  QHS   vitamin B-12  1,000 mcg Oral Daily   Continuous  Infusions:   LOS: 2 days        Hosie Poisson, MD [facility]   To contact the attending provider between 7A-7P or the covering provider during after hours 7P-7A, please log into the web site [email or web address] and access using universal [place] password for that web site. If you do not have the password, please call the hospital operator.  01/22/2021, 4:27 PM

## 2021-01-22 NOTE — Progress Notes (Signed)
Dr. Oval Linsey requested f/u with cardiology in 1 month, appt scheduled 11/2 and placed on AVS. Would suggest primary care f/u in 1 week to obtain BMET at that time. Call with questions.

## 2021-01-22 NOTE — Plan of Care (Signed)
  Problem: Clinical Measurements: Goal: Will remain free from infection Outcome: Progressing   Problem: Clinical Measurements: Goal: Respiratory complications will improve Outcome: Progressing   Problem: Safety: Goal: Ability to remain free from injury will improve Outcome: Progressing   

## 2021-01-22 NOTE — TOC Progression Note (Signed)
Transition of Care [facility]) - Progression Note    Patient Details  Name: Brandi Stephens MRN: [id number] Date of Birth: 04/11/1946  [facility] CM/SW Contact  Pollie Friar, RN Phone Number: 01/22/2021, 12:27 PM  Clinical Narrative:    [facility] [employer] has accepted for [facility] services. Information on the AVS.  Pt states she has rollator at home and a seat in the shower. CM is following for potential oxygen needs at home.    Expected Discharge Plan: [place] Barriers to Discharge: Continued Medical Work up  Expected Discharge Plan and Services Expected Discharge Plan: [place]   Discharge Planning Services: CM Consult Post Acute Care Choice: [place] arrangements for the past 2 months: Single Family Home Expected Discharge Date: 01/19/21                         HH Arranged: PT, OT, RN [place] Agency: [place] ([facility]) Date HH Agency Contacted: 01/22/21   Representative spoke with at [place]: [place] ([place]) Interventions    Readmission Risk Interventions Readmission Risk Prevention Plan 08/02/2019  Transportation Screening Complete  PCP or Specialist appointment within 3-5 days of discharge (No Data)  [place] or [place] Complete  SW Recovery Care/Counseling Consult Complete  [place] Not Applicable  Some recent data might be hidden

## 2021-01-23 LAB — BASIC METABOLIC PANEL
Anion gap: 11 (ref 5–15)
BUN: 38 mg/dL — ABNORMAL HIGH (ref 8–23)
CO2: 26 mmol/L (ref 22–32)
Calcium: 9.6 mg/dL (ref 8.9–10.3)
Chloride: 98 mmol/L (ref 98–111)
Creatinine, Ser: 1.23 mg/dL — ABNORMAL HIGH (ref 0.44–1.00)
GFR, Estimated: 46 mL/min — ABNORMAL LOW (ref >60)
Glucose, Bld: 158 mg/dL — ABNORMAL HIGH (ref 70–99)
Potassium: 3.9 mmol/L (ref 3.5–5.1)
Sodium: 135 mmol/L (ref 135–145)

## 2021-01-23 LAB — CBC
HCT: 38 % (ref 36.0–46.0)
Hemoglobin: 12.6 g/dL (ref 12.0–15.0)
MCH: 32.3 pg (ref 26.0–34.0)
MCHC: 33.2 g/dL (ref 30.0–36.0)
MCV: 97.4 fL (ref 80.0–100.0)
Platelets: 196 10*3/uL (ref 150–400)
RBC: 3.9 MIL/uL (ref 3.87–5.11)
RDW: 14.5 % (ref 11.5–15.5)
WBC: 8.6 10*3/uL (ref 4.0–10.5)
nRBC: 0 % (ref 0.0–0.2)

## 2021-01-23 MED ORDER — DIPHENHYDRAMINE HCL 25 MG PO CAPS
25.0000 mg | ORAL_CAPSULE | Freq: Once | ORAL | Status: AC
  Administered 2021-01-23: 25 mg via ORAL
  Filled 2021-01-23: qty 1

## 2021-01-23 MED ORDER — PANTOPRAZOLE SODIUM 40 MG PO TBEC
40.0000 mg | DELAYED_RELEASE_TABLET | Freq: Every day | ORAL | Status: DC
  Administered 2021-01-23 – 2021-01-26 (×4): 40 mg via ORAL
  Filled 2021-01-23 (×4): qty 1

## 2021-01-23 MED ORDER — ONDANSETRON HCL 4 MG/2ML IJ SOLN
4.0000 mg | Freq: Four times a day (QID) | INTRAMUSCULAR | Status: DC | PRN
  Administered 2021-01-25: 4 mg via INTRAVENOUS
  Filled 2021-01-23 (×2): qty 2

## 2021-01-23 MED ORDER — DIAZEPAM 5 MG PO TABS
5.0000 mg | ORAL_TABLET | Freq: Three times a day (TID) | ORAL | Status: DC | PRN
  Administered 2021-01-24 – 2021-01-26 (×3): 5 mg via ORAL
  Filled 2021-01-23 (×3): qty 1

## 2021-01-23 MED ORDER — ONDANSETRON HCL 4 MG PO TABS
4.0000 mg | ORAL_TABLET | Freq: Once | ORAL | Status: AC
  Administered 2021-01-23: 4 mg via ORAL
  Filled 2021-01-23: qty 1

## 2021-01-23 NOTE — Progress Notes (Signed)
PROGRESS NOTE    Brandi Stephens  OIB:[id number] DOB: 14-Aug-1945 DOA: 01/18/2021 PCP: Girtha Rm, NP-C    Chief Complaint  Patient presents with   Follow-up    Brief Narrative:  75 year old F with PMH of diastolic CHF, COPD not on O2, CKD-3A, HTN, HLD, hypothyroidism, morbid obesity, breast cancer s/p left breast lumpectomy, radiation and hormonal therapy, anxiety, depression and RLS sent to ED from PCP office with hypoxemia and tachycardia.  She went to see PCP for lower extremity edema.  She was admitted with working diagnosis of possible PE that could not be excluded by CTA chest.  She was started on IV heparin.  VQ scan and LE venous Doppler were ordered.   The next day, VQ scan and LE venous US negative for VTE.  Heparin discontinued.  She was ambulated and desaturated to 79% on RA.  Started on IV Lasix with improvement in the breathing and pedal edema.  Pt seen and examined. Weaned oxygen to 2l it .  This am pt appears nauseated.    Assessment & Plan:   Principal Problem:   Acute hypoxemic respiratory failure (HCC) Active Problems:   Essential hypertension   COPD (chronic obstructive pulmonary disease) (HCC)   Prediabetes   CKD (chronic kidney disease), stage III   Inappropriate sinus tachycardia   Muscular deconditioning   Acute respiratory failure with hypoxia (HCC)   Morbid obesity (HCC)  Acute respiratory failure with hypoxia probably secondary to combination of acute on chronic diastolic heart failure in the setting of emphysema/COPD. Improving with IV lasix. So far she has received about 2 doses Echocardiogram showed Left ventricular ejection fraction is 60 to 65% with normal function. There is mild left ventricular hypertrophy. Left ventricular diastolic parameters are consistent with Grade I diastolic dysfunction (impaired relaxation). Aortic dilatation noted. There is mild dilatation of the ascending aorta.  PE is ruled out by CTA of the chest and VQ scan.   Currently on 1 to 3 L of nasal cannula oxygen to keep sats greater than 90% Creatinine appears to be  back to baseline.  Cardiology consulted , transitioned to bumex as she is not responding to oral lasix at home . Pt diuresing well , so far 3 lit since admission.    Chronic COPD No wheezing on exam on today.  Continue with Dulera and duo nebs as needed.   Stage IIIa CKD Baseline creatinine around 1.1. creatinine up to 1.2 today.    History of gout Continue with allopurinol.   Hypertension Blood pressure parameters well controlled.  Continue with metoprolol.    Hypothyroidism Continue with Synthroid.  Back pain;  S/p posterior fusion surgery at L2  to S1. Complicated by paraspinal abscesses.  On keflex for chronic suppression.  Follows up with Dr Megan Salon with ID as outpatient.    History of breast cancer s/p left lumpectomy in 2017, radiation and hormonal therapy Continue with home anastrozole.  Conditioning/debility Therapy evaluation recommending home health PT on discharge.  DVT prophylaxis:Lovenox.  Code Status:  full code.  Family Communication: none at bedside.  Disposition:   Status is: Observation  The patient will require care spanning > 2 midnights and should be moved to inpatient because: IV treatments appropriate due to intensity of illness or inability to take PO  Dispo: The patient is from: Home              Anticipated d/c is to: Home  Patient currently is not medically stable to d/c.   Difficult to place patient No       Consultants:  Cardiology.   Procedures: none.   Antimicrobials: none.    Subjective: Pt reports feeling nauseated.   Objective: Vitals:   01/23/21 0710 01/23/21 0719 01/23/21 1044 01/23/21 1536  BP: (!) 116/94  131/85 137/84  Pulse: 82  (!) 108 (!) 103  Resp: 12  18 19   Temp: 98.2 F (36.8 C)  98.2 F (36.8 C) 97.8 F (36.6 C)  TempSrc: Oral  Oral Oral  SpO2: 94% 95% 92% 92%  Weight:       Height:        Intake/Output Summary (Last 24 hours) at 01/23/2021 1544 Last data filed at 01/23/2021 1539 Gross per 24 hour  Intake 600 ml  Output 1800 ml  Net -1200 ml    Filed Weights   01/21/21 0300 01/22/21 0301 01/23/21 0550  Weight: 125.9 kg 125.9 kg 126.3 kg    Examination:  General exam: Appears calm and comfortable  Respiratory system: Clear to auscultation. Respiratory effort normal. On 2lit of [place] oxygen.  Cardiovascular system: S1 & S2 heard, RRR. JVD cannot be assessed, pedal edema improving.  Gastrointestinal system: Abdomen is nondistended, soft and nontender.  Normal bowel sounds heard. Central nervous system: Alert and oriented. No focal neurological deficits. Extremities: Symmetric 5 x 5 power. Skin: No rashes, lesions or ulcers Psychiatry: Mood & affect appropriate.      Data Reviewed: I have personally reviewed following labs and imaging studies  CBC: Recent Labs  Lab 01/18/21 1348 01/19/21 0403 01/20/21 0404 01/21/21 0436 01/22/21 0128 01/23/21 0108  WBC 12.8* 10.9* 9.2 8.8 9.3 8.6  NEUTROABS 9.9*  --   --   --   --   --   HGB 14.0 12.1 13.4 12.1 12.6 12.6  HCT 42.6 37.0 40.0 37.7 37.2 38.0  MCV 98.6 99.7 98.5 100.3* 96.9 97.4  PLT 250 198 246 201 202 196     Basic Metabolic Panel: Recent Labs  Lab 01/19/21 1500 01/19/21 1604 01/20/21 1237 01/21/21 0436 01/22/21 0936 01/23/21 0108  NA 138  --  137 137 135 135  K 4.5  --  4.2 4.2 4.1 3.9  CL 102  --  101 100 100 98  CO2 27  --  25 27 24 26   GLUCOSE 194*  --  165* 184* 169* 158*  BUN 29*  --  29* 41* 36* 38*  CREATININE 1.12*  --  1.13* 1.27* 1.16* 1.23*  CALCIUM 9.3  --  9.7 9.6 9.6 9.6  MG  --  2.4  --   --   --   --   PHOS 4.3  --   --   --   --   --      GFR: Estimated Creatinine Clearance: 55.4 mL/min (A) (by C-G formula based on SCr of 1.23 mg/dL (H)).  Liver Function Tests: Recent Labs  Lab 01/19/21 1500  ALBUMIN 3.7     CBG: No results for input(s): GLUCAP  in the last 168 hours.   Recent Results (from the past 240 hour(s))  Resp Panel by RT-PCR (Flu A&B, Covid) Nasopharyngeal Swab     Status: None   Collection Time: 01/18/21  8:46 PM   Specimen: Nasopharyngeal Swab; Nasopharyngeal(NP) swabs in vial transport medium  Result Value Ref Range Status   SARS Coronavirus 2 by RT PCR NEGATIVE NEGATIVE Final    Comment: (NOTE) SARS-CoV-2 target nucleic  acids are NOT DETECTED.  The SARS-CoV-2 RNA is generally detectable in upper respiratory specimens during the acute phase of infection. The lowest concentration of SARS-CoV-2 viral copies this assay can detect is 138 copies/mL. A negative result does not preclude SARS-Cov-2 infection and should not be used as the sole basis for treatment or other patient management decisions. A negative result may occur with  improper specimen collection/handling, submission of specimen other than nasopharyngeal swab, presence of viral mutation(s) within the areas targeted by this assay, and inadequate number of viral copies(<138 copies/mL). A negative result must be combined with clinical observations, patient history, and epidemiological information. The expected result is Negative.  Fact Sheet for Patients:  EntrepreneurPulse.com.au  Fact Sheet for Healthcare Providers:  IncredibleEmployment.be  This test is no t yet approved or cleared by the [place] FDA and  has been authorized for detection and/or diagnosis of SARS-CoV-2 by FDA under an Emergency Use Authorization (EUA). This EUA will remain  in effect (meaning this test can be used) for the duration of the COVID-19 declaration under Section 564(b)(1) of the Act, 21 U.S.C.section 360bbb-3(b)(1), unless the authorization is terminated  or revoked sooner.       Influenza A by PCR NEGATIVE NEGATIVE Final   Influenza B by PCR NEGATIVE NEGATIVE Final    Comment: (NOTE) The Xpert Xpress SARS-CoV-2/FLU/RSV plus  assay is intended as an aid in the diagnosis of influenza from Nasopharyngeal swab specimens and should not be used as a sole basis for treatment. Nasal washings and aspirates are unacceptable for Xpert Xpress SARS-CoV-2/FLU/RSV testing.  Fact Sheet for Patients: EntrepreneurPulse.com.au  Fact Sheet for Healthcare Providers: IncredibleEmployment.be  This test is not yet approved or cleared by the [place] FDA and has been authorized for detection and/or diagnosis of SARS-CoV-2 by FDA under an Emergency Use Authorization (EUA). This EUA will remain in effect (meaning this test can be used) for the duration of the COVID-19 declaration under Section 564(b)(1) of the Act, 21 U.S.C. section 360bbb-3(b)(1), unless the authorization is terminated or revoked.  Performed at [name], [place] [address] [place],  [address]   MRSA Next Gen by PCR, Nasal     Status: None   Collection Time: 01/20/21  4:00 AM   Specimen: Nasal Mucosa; Nasal Swab  Result Value Ref Range Status   MRSA by PCR Next Gen NOT DETECTED NOT DETECTED Final    Comment: (NOTE) The GeneXpert MRSA Assay (FDA approved for NASAL specimens only), is one component of a comprehensive MRSA colonization surveillance program. It is not intended to diagnose MRSA infection nor to guide or monitor treatment for MRSA infections. Test performance is not FDA approved in patients less than 50 years old. Performed at [facility], [place] [address] [place],  [address]           Radiology Studies: No results found.      Scheduled Meds:  allopurinol  300 mg Oral Daily   amitriptyline  75 mg Oral QHS   anastrozole  1 mg Oral Daily   atorvastatin  20 mg Oral Daily   bumetanide  1 mg Oral Daily   cephALEXin  500 mg Oral TID   DULoxetine  60 mg Oral Daily   enoxaparin (LOVENOX) injection  40 mg Subcutaneous Q24H   levothyroxine  75 mcg Oral Q0600    metoprolol succinate  50 mg Oral Daily   mometasone-formoterol  2 puff Inhalation BID   pantoprazole  40 mg Oral  [id number]   primidone  50 mg Oral BID   QUEtiapine  25 mg Oral QHS   rOPINIRole  0.75 mg Oral QHS   vitamin B-12  1,000 mcg Oral Daily   Continuous Infusions:   LOS: 3 days        Hosie Poisson, MD [facility]   To contact the attending provider between 7A-7P or the covering provider during after hours 7P-7A, please log into the web site [email or web address] and access using universal [place] password for that web site. If you do not have the password, please call the hospital operator.  01/23/2021, 3:44 PM

## 2021-01-24 LAB — BASIC METABOLIC PANEL
Anion gap: 9 (ref 5–15)
BUN: 39 mg/dL — ABNORMAL HIGH (ref 8–23)
CO2: 27 mmol/L (ref 22–32)
Calcium: 9.2 mg/dL (ref 8.9–10.3)
Chloride: 98 mmol/L (ref 98–111)
Creatinine, Ser: 1.32 mg/dL — ABNORMAL HIGH (ref 0.44–1.00)
GFR, Estimated: 42 mL/min — ABNORMAL LOW (ref >60)
Glucose, Bld: 220 mg/dL — ABNORMAL HIGH (ref 70–99)
Potassium: 4 mmol/L (ref 3.5–5.1)
Sodium: 134 mmol/L — ABNORMAL LOW (ref 135–145)

## 2021-01-24 LAB — GLUCOSE, CAPILLARY: Glucose-Capillary: 149 mg/dL — ABNORMAL HIGH (ref 70–99)

## 2021-01-24 MED ORDER — INSULIN ASPART 100 UNIT/ML IJ SOLN: 0.0000 [IU] | Freq: Every day | INTRAMUSCULAR | Status: DC

## 2021-01-24 MED ORDER — OXYCODONE-ACETAMINOPHEN 5-325 MG PO TABS
1.0000 | ORAL_TABLET | Freq: Four times a day (QID) | ORAL | Status: DC | PRN
Start: 2021-01-24 — End: 2021-01-26
  Administered 2021-01-24 – 2021-01-25 (×2): 1 via ORAL
  Filled 2021-01-24 (×2): qty 1

## 2021-01-24 MED ORDER — INSULIN ASPART 100 UNIT/ML IJ SOLN
0.0000 [IU] | Freq: Three times a day (TID) | INTRAMUSCULAR | Status: DC
  Administered 2021-01-25 (×2): 3 [IU] via SUBCUTANEOUS
  Administered 2021-01-25 – 2021-01-26 (×2): 2 [IU] via SUBCUTANEOUS
  Administered 2021-01-26: 9 [IU] via SUBCUTANEOUS

## 2021-01-24 NOTE — Care Management (Addendum)
Asked RN for oxygen  qualifications. RN Afirmed need and will do them.

## 2021-01-24 NOTE — Progress Notes (Signed)
PROGRESS NOTE    Brandi Stephens  MVH:[id number] DOB: 06-06-1945 DOA: 01/18/2021 PCP: Girtha Rm, NP-C    Chief Complaint  Patient presents with   Follow-up    Brief Narrative:  75 year old F with PMH of diastolic CHF, COPD not on O2, CKD-3A, HTN, HLD, hypothyroidism, morbid obesity, breast cancer s/p left breast lumpectomy, radiation and hormonal therapy, anxiety, depression and RLS sent to ED from PCP office with hypoxemia and tachycardia.  She went to see PCP for lower extremity edema.  She was admitted with working diagnosis of possible PE that could not be excluded by CTA chest.  She was started on IV heparin.  VQ scan and LE venous Doppler were ordered.   The next day, VQ scan and LE venous US negative for VTE.  Heparin discontinued.  She was ambulated and desaturated to 79% on RA.  Started on IV Lasix with improvement in the breathing and pedal edema.  Pt seen and examined, unable to wean her off oxygen.  Will discharge her on 3lit of [place] oxygen.    Assessment & Plan:   Principal Problem:   Acute hypoxemic respiratory failure (HCC) Active Problems:   Essential hypertension   COPD (chronic obstructive pulmonary disease) (HCC)   Prediabetes   CKD (chronic kidney disease), stage III   Inappropriate sinus tachycardia   Muscular deconditioning   Acute respiratory failure with hypoxia (HCC)   Morbid obesity (HCC)  Acute respiratory failure with hypoxia probably secondary to combination of acute on chronic diastolic heart failure in the setting of emphysema/COPD. Improving with IV lasix. So far she has received about 2 doses Echocardiogram showed Left ventricular ejection fraction is 60 to 65% with normal function. There is mild left ventricular hypertrophy. Left ventricular diastolic parameters are consistent with Grade I diastolic dysfunction (impaired relaxation). Aortic dilatation noted. There is mild dilatation of the ascending aorta.  PE is ruled out by CTA of the  chest and VQ scan.  Currently on 1 to 3 L of nasal cannula oxygen to keep sats greater than 90% Creatinine appears to be  back to baseline.  Cardiology consulted , transitioned to bumex as she is not responding to oral lasix at home . Pt diuresing well , so far 3 lit since admission.    Chronic COPD No wheezing on exam on today.  Continue with Dulera and duo nebs as needed.   Stage IIIa CKD Baseline creatinine around 1.1. Currently around 1.3 If continues to worsen, we might need to cut down on the bumex dose.    History of gout Continue with allopurinol.   Hypertension Blood pressure parameters are optimal.  Continue with metoprolol.    Hypothyroidism Continue with Synthroid.  Back pain;  S/p posterior fusion surgery at L2  to S1. Complicated by paraspinal abscesses.  On keflex for chronic suppression.  Follows up with Dr Megan Salon with ID as outpatient.    History of breast cancer s/p left lumpectomy in 2017, radiation and hormonal therapy Continue with home anastrozole.  Conditioning/debility Therapy evaluation recommending home health PT on discharge. Unable to wean her off the oxygen, will plan for oxygen on discharge.  She reports that she cannot go home as she doesn;t have the keys.    Diabetes mellitus type 2 uncontrolled with hyperglycemia , non insulin dependent.  CBG (last 3)  No results for input(s): GLUCAP in the last 72 hours. Hemoglobin A1c is 7.  Started her on SSI.    DVT prophylaxis:Lovenox.  Code Status:  full code.  Family Communication: none at bedside.  Disposition:   Status is: Observation  The patient will require care spanning > 2 midnights and should be moved to inpatient because: Unsafe d/c plan  Dispo: The patient is from: Home              Anticipated d/c is to: Home              Patient currently is medically stable to d/c.   Difficult to place patient No       Consultants:  Cardiology.   Procedures: none.    Antimicrobials: none.    Subjective: Disappointed that she needs to go home on oxygen.   Objective: Vitals:   01/24/21 1300 01/24/21 1400 01/24/21 1527 01/24/21 1600  BP:   121/66   Pulse: (!) 101 (!) 108 91 91  Resp:  (!) 22 20 20   Temp:   98 F (36.7 C)   TempSrc:   Oral   SpO2: (!) 89% 90% 90% 93%  Weight:      Height:        Intake/Output Summary (Last 24 hours) at 01/24/2021 1656 Last data filed at 01/24/2021 1400 Gross per 24 hour  Intake 480 ml  Output 300 ml  Net 180 ml    Filed Weights   01/22/21 0301 01/23/21 0550 01/24/21 0329  Weight: 125.9 kg 126.3 kg 125.6 kg    Examination:  General exam: Appears calm and comfortable  Respiratory system: on 3 lit of [place] Oxygen. Diminished at bases.  Cardiovascular system: S1 & S2 heard, RRR.  No pedal edema. Gastrointestinal system: Abdomen is nondistended, soft and nontender. . Normal bowel sounds heard. Central nervous system: Alert and oriented. No focal neurological deficits. Extremities: Symmetric 5 x 5 power. Skin: No rashes, lesions or ulcers Psychiatry:  Mood & affect appropriate.     Data Reviewed: I have personally reviewed following labs and imaging studies  CBC: Recent Labs  Lab 01/18/21 1348 01/19/21 0403 01/20/21 0404 01/21/21 0436 01/22/21 0128 01/23/21 0108  WBC 12.8* 10.9* 9.2 8.8 9.3 8.6  NEUTROABS 9.9*  --   --   --   --   --   HGB 14.0 12.1 13.4 12.1 12.6 12.6  HCT 42.6 37.0 40.0 37.7 37.2 38.0  MCV 98.6 99.7 98.5 100.3* 96.9 97.4  PLT 250 198 246 201 202 196     Basic Metabolic Panel: Recent Labs  Lab 01/19/21 1500 01/19/21 1604 01/20/21 1237 01/21/21 0436 01/22/21 0936 01/23/21 0108 01/24/21 0045  NA 138  --  137 137 135 135 134*  K 4.5  --  4.2 4.2 4.1 3.9 4.0  CL 102  --  101 100 100 98 98  CO2 27  --  25 27 24 26 27   GLUCOSE 194*  --  165* 184* 169* 158* 220*  BUN 29*  --  29* 41* 36* 38* 39*  CREATININE 1.12*  --  1.13* 1.27* 1.16* 1.23* 1.32*  CALCIUM 9.3  --   9.7 9.6 9.6 9.6 9.2  MG  --  2.4  --   --   --   --   --   PHOS 4.3  --   --   --   --   --   --      GFR: Estimated Creatinine Clearance: 51.5 mL/min (A) (by C-G formula based on SCr of 1.32 mg/dL (H)).  Liver Function Tests: Recent Labs  Lab 01/19/21 1500  ALBUMIN 3.7  CBG: No results for input(s): GLUCAP in the last 168 hours.   Recent Results (from the past 240 hour(s))  Resp Panel by RT-PCR (Flu A&B, Covid) Nasopharyngeal Swab     Status: None   Collection Time: 01/18/21  8:46 PM   Specimen: Nasopharyngeal Swab; Nasopharyngeal(NP) swabs in vial transport medium  Result Value Ref Range Status   SARS Coronavirus 2 by RT PCR NEGATIVE NEGATIVE Final    Comment: (NOTE) SARS-CoV-2 target nucleic acids are NOT DETECTED.  The SARS-CoV-2 RNA is generally detectable in upper respiratory specimens during the acute phase of infection. The lowest concentration of SARS-CoV-2 viral copies this assay can detect is 138 copies/mL. A negative result does not preclude SARS-Cov-2 infection and should not be used as the sole basis for treatment or other patient management decisions. A negative result may occur with  improper specimen collection/handling, submission of specimen other than nasopharyngeal swab, presence of viral mutation(s) within the areas targeted by this assay, and inadequate number of viral copies(<138 copies/mL). A negative result must be combined with clinical observations, patient history, and epidemiological information. The expected result is Negative.  Fact Sheet for Patients:  EntrepreneurPulse.com.au  Fact Sheet for Healthcare Providers:  IncredibleEmployment.be  This test is no t yet approved or cleared by the [place] FDA and  has been authorized for detection and/or diagnosis of SARS-CoV-2 by FDA under an Emergency Use Authorization (EUA). This EUA will remain  in effect (meaning this test can be used) for the  duration of the COVID-19 declaration under Section 564(b)(1) of the Act, 21 U.S.C.section 360bbb-3(b)(1), unless the authorization is terminated  or revoked sooner.       Influenza A by PCR NEGATIVE NEGATIVE Final   Influenza B by PCR NEGATIVE NEGATIVE Final    Comment: (NOTE) The Xpert Xpress SARS-CoV-2/FLU/RSV plus assay is intended as an aid in the diagnosis of influenza from Nasopharyngeal swab specimens and should not be used as a sole basis for treatment. Nasal washings and aspirates are unacceptable for Xpert Xpress SARS-CoV-2/FLU/RSV testing.  Fact Sheet for Patients: EntrepreneurPulse.com.au  Fact Sheet for Healthcare Providers: IncredibleEmployment.be  This test is not yet approved or cleared by the [place] FDA and has been authorized for detection and/or diagnosis of SARS-CoV-2 by FDA under an Emergency Use Authorization (EUA). This EUA will remain in effect (meaning this test can be used) for the duration of the COVID-19 declaration under Section 564(b)(1) of the Act, 21 U.S.C. section 360bbb-3(b)(1), unless the authorization is terminated or revoked.  Performed at [facility], [place] [address] [place], [place] [address]   MRSA Next Gen by PCR, Nasal     Status: None   Collection Time: 01/20/21  4:00 AM   Specimen: Nasal Mucosa; Nasal Swab  Result Value Ref Range Status   MRSA by PCR Next Gen NOT DETECTED NOT DETECTED Final    Comment: (NOTE) The GeneXpert MRSA Assay (FDA approved for NASAL specimens only), is one component of a comprehensive MRSA colonization surveillance program. It is not intended to diagnose MRSA infection nor to guide or monitor treatment for MRSA infections. Test performance is not FDA approved in patients less than 28 years old. Performed at [facility], [place] [address], [place],  [address]           Radiology Studies: No results found.      Scheduled Meds:   allopurinol  300 mg Oral Daily   amitriptyline  75 mg Oral QHS  anastrozole  1 mg Oral Daily   atorvastatin  20 mg Oral Daily   bumetanide  1 mg Oral Daily   cephALEXin  500 mg Oral TID   DULoxetine  60 mg Oral Daily   enoxaparin (LOVENOX) injection  40 mg Subcutaneous Q24H   levothyroxine  75 mcg Oral Q0600   metoprolol succinate  50 mg Oral Daily   mometasone-formoterol  2 puff Inhalation BID   pantoprazole  40 mg Oral Q0600   primidone  50 mg Oral BID   QUEtiapine  25 mg Oral QHS   rOPINIRole  0.75 mg Oral QHS   vitamin B-12  1,000 mcg Oral Daily   Continuous Infusions:   LOS: 4 days        Hosie Poisson, MD [facility]   To contact the attending provider between 7A-7P or the covering provider during after hours 7P-7A, please log into the web site [email or web address] and access using universal [place] password for that web site. If you do not have the password, please call the hospital operator.  01/24/2021, 4:56 PM

## 2021-01-24 NOTE — Progress Notes (Signed)
SATURATION QUALIFICATIONS: (This note is used to comply with regulatory documentation for home oxygen)  Patient Saturations on Room Air at Rest - desats right away into 70s%  Patient Saturations on Room Air while Ambulating = None  Patient Saturations on 3 Liters of oxygen while Ambulating = 95%  Please briefly explain why patient needs home oxygen:  Pt was unable to walk without the oxygen due to desating, as well as Anxiety. HR in 110s. Ambulated about 100 feet.   Emotional support given and education provided about therapeutic techniques to reduce anxiety. Pt seems very appreciated, verbalizing understanding and demonstrated back. After walking, pt took a bath while in the chair, Valium for anxiety was given, O2 [place] was removed by the patient and was doing fine. No distress noted. VS stable

## 2021-01-25 LAB — BASIC METABOLIC PANEL
Anion gap: 9 (ref 5–15)
BUN: 44 mg/dL — ABNORMAL HIGH (ref 8–23)
CO2: 28 mmol/L (ref 22–32)
Calcium: 9.4 mg/dL (ref 8.9–10.3)
Chloride: 99 mmol/L (ref 98–111)
Creatinine, Ser: 1.69 mg/dL — ABNORMAL HIGH (ref 0.44–1.00)
GFR, Estimated: 31 mL/min — ABNORMAL LOW (ref >60)
Glucose, Bld: 185 mg/dL — ABNORMAL HIGH (ref 70–99)
Potassium: 4 mmol/L (ref 3.5–5.1)
Sodium: 136 mmol/L (ref 135–145)

## 2021-01-25 LAB — GLUCOSE, CAPILLARY
Glucose-Capillary: 207 mg/dL — ABNORMAL HIGH (ref 70–99)
Glucose-Capillary: 201 mg/dL — ABNORMAL HIGH (ref 70–99)
Glucose-Capillary: 167 mg/dL — ABNORMAL HIGH (ref 70–99)
Glucose-Capillary: 168 mg/dL — ABNORMAL HIGH (ref 70–99)

## 2021-01-25 MED ORDER — DIPHENHYDRAMINE HCL 25 MG PO CAPS
50.0000 mg | ORAL_CAPSULE | Freq: Four times a day (QID) | ORAL | Status: DC | PRN
  Administered 2021-01-25 – 2021-01-26 (×2): 50 mg via ORAL
  Filled 2021-01-25 (×2): qty 2

## 2021-01-25 NOTE — TOC Progression Note (Signed)
Transition of Care [facility]) - Progression Note    Patient Details  Name: Brandi Stephens MRN: [id number] Date of Birth: [birth year]  [facility] CM/SW Contact  Brandi Bodo, RN Phone Number: 01/25/2021, 2:13 PM  Clinical Narrative:    Patient continues to desaturate with ambulation; will require home oxygen at discharge.  Referral to [facility] for home O2 set up.   Expected Discharge Plan: [place] Barriers to Discharge: Continued Medical Work up  Expected Discharge Plan and Services Expected Discharge Plan: [place]   Discharge Planning Services: CM Consult Post Acute Care Choice: [place] arrangements for the past 2 months: Single Family Home Expected Discharge Date: 01/19/21               DME Arranged: Oxygen DME Agency: [employer] Date DME Agency Contacted: 01/25/21 Time DME Agency Contacted: [phone] Representative spoke with at DME Agency: Brandi Stephens HH Arranged: PT, OT, RN [facility] Agency: [place] ([place]) Date [place]: 01/22/21   Representative spoke with at [place]: Brandi Stephens      Readmission Risk Interventions Readmission Risk Prevention Plan 08/02/2019  Transportation Screening Complete  PCP or Specialist appointment within 3-5 days of discharge (No Data)  [place] or [place] Complete  SW Recovery Care/Counseling Consult Complete  [place] Not Applicable  Some recent data might be hidden   Brandi Raddle, RN, BSN  Trauma/Neuro ICU Case Manager [phone]

## 2021-01-25 NOTE — Progress Notes (Signed)
PROGRESS NOTE    Brandi Stephens  IOE:[id number] DOB: 09-20-1945 DOA: 01/18/2021 PCP: Girtha Rm, NP-C    Chief Complaint  Patient presents with   Follow-up    Brief Narrative:  75 year old F with PMH of diastolic CHF, COPD not on O2, CKD-3A, HTN, HLD, hypothyroidism, morbid obesity, breast cancer s/p left breast lumpectomy, radiation and hormonal therapy, anxiety, depression and RLS sent to ED from PCP office with hypoxemia and tachycardia.  She went to see PCP for lower extremity edema.  She was admitted with working diagnosis of possible PE that could not be excluded by CTA chest.  She was started on IV heparin.  VQ scan and LE venous Doppler were ordered.   The next day, VQ scan and LE venous US negative for VTE.  Heparin discontinued.  She was ambulated and desaturated to 79% on RA.  Started on IV Lasix with improvement in the breathing and pedal edema.  Pt seen and examined, unable to wean her off oxygen.  Will discharge her on 3lit of [place] oxygen.    Assessment & Plan:   Principal Problem:   Acute hypoxemic respiratory failure (HCC) Active Problems:   Essential hypertension   COPD (chronic obstructive pulmonary disease) (HCC)   Prediabetes   CKD (chronic kidney disease), stage III   Inappropriate sinus tachycardia   Muscular deconditioning   Acute respiratory failure with hypoxia (HCC)   Morbid obesity (HCC)  Acute respiratory failure with hypoxia probably secondary to combination of acute on chronic diastolic heart failure in the setting of emphysema/COPD. Improving with IV lasix. So far she has received about 2 doses Echocardiogram showed Left ventricular ejection fraction is 60 to 65% with normal function. There is mild left ventricular hypertrophy. Left ventricular diastolic parameters are consistent with Grade I diastolic dysfunction (impaired relaxation). Aortic dilatation noted. There is mild dilatation of the ascending aorta.  PE is ruled out by CTA of the  chest and VQ scan.  Currently on 1 to 3 L of nasal cannula oxygen to keep sats greater than 90% Creatinine appears to be  back to baseline.  Cardiology consulted , transitioned to bumex as she is not responding to oral lasix at home . Pt diuresing well , so far 3 lit since admission.    Chronic COPD No wheezing on exam on today.  Continue with Dulera and duo nebs as needed.   Acute on Stage IIIa CKD Baseline creatinine around 1.1. creatinine worsened to 1.6. bumex discontinued , will need to make sure her creatinine is improving, prior to discharge.  Will probably need torsemide on discharge.     History of gout Continue with allopurinol.   Hypertension Blood pressure parameters are optimal.  Continue with metoprolol.    Hypothyroidism Continue with Synthroid.  Back pain;  S/p posterior fusion surgery at L2  to S1. Complicated by paraspinal abscesses.  On keflex for chronic suppression.  Follows up with Dr Megan Salon with ID as outpatient.    History of breast cancer s/p left lumpectomy in 2017, radiation and hormonal therapy Continue with home anastrozole.  Conditioning/debility Therapy evaluation recommending home health PT on discharge. Unable to wean her off the oxygen, will plan for oxygen on discharge.  Plan for discharge when her renal parameters improve.    Diabetes mellitus type 2 uncontrolled with hyperglycemia , non insulin dependent.  CBG (last 3)  Recent Labs    01/24/21 2117 01/25/21 0632 01/25/21 1138  GLUCAP 149* 207* 201*   Hemoglobin A1c  is 7.  Started her on SSI.    DVT prophylaxis:Lovenox.  Code Status:  full code.  Family Communication: none at bedside.  Disposition:   Status is: Observation  The patient will require care spanning > 2 midnights and should be moved to inpatient because: Unsafe d/c plan  Dispo: The patient is from: Home              Anticipated d/c is to: Home              Patient currently is not medically stable to  d/c.   Difficult to place patient No       Consultants:  Cardiology.   Procedures: none.   Antimicrobials: none.    Subjective: Is hopeful to go home soon.  She diuresed about 4 lit since admission.   Objective: Vitals:   01/25/21 0451 01/25/21 0724 01/25/21 0746 01/25/21 1133  BP: (!) 112/57  126/77 136/80  Pulse: 64 78 79 93  Resp: 12 14 19 15   Temp: 98 F (36.7 C)  98.3 F (36.8 C) 97.9 F (36.6 C)  TempSrc: Oral  Oral Oral  SpO2: 95% 92% 90% 90%  Weight: 125.7 kg     Height:        Intake/Output Summary (Last 24 hours) at 01/25/2021 1354 Last data filed at 01/25/2021 1301 Gross per 24 hour  Intake 120 ml  Output 1375 ml  Net -1255 ml    Filed Weights   01/23/21 0550 01/24/21 0329 01/25/21 0451  Weight: 126.3 kg 125.6 kg 125.7 kg    Examination:  General exam: Appears calm and comfortable  Respiratory system: diminished air entry at bases.  Cardiovascular system: S1 & S2 heard, RRR. No JVD, improving pedal edema.  Gastrointestinal system: Abdomen is nondistended, soft and nontender. . Normal bowel sounds heard. Central nervous system: Alert and oriented. No focal neurological deficits. Extremities: Symmetric 5 x 5 power. Skin: No rashes, lesions or ulcers Psychiatry: Mood & affect appropriate.      Data Reviewed: I have personally reviewed following labs and imaging studies  CBC: Recent Labs  Lab 01/19/21 0403 01/20/21 0404 01/21/21 0436 01/22/21 0128 01/23/21 0108  WBC 10.9* 9.2 8.8 9.3 8.6  HGB 12.1 13.4 12.1 12.6 12.6  HCT 37.0 40.0 37.7 37.2 38.0  MCV 99.7 98.5 100.3* 96.9 97.4  PLT 198 246 201 202 196     Basic Metabolic Panel: Recent Labs  Lab 01/19/21 1500 01/19/21 1604 01/20/21 1237 01/21/21 0436 01/22/21 0936 01/23/21 0108 01/24/21 0045 01/25/21 0022  NA 138  --    < > 137 135 135 134* 136  K 4.5  --    < > 4.2 4.1 3.9 4.0 4.0  CL 102  --    < > 100 100 98 98 99  CO2 27  --    < > 27 24 26 27 28   GLUCOSE 194*   --    < > 184* 169* 158* 220* 185*  BUN 29*  --    < > 41* 36* 38* 39* 44*  CREATININE 1.12*  --    < > 1.27* 1.16* 1.23* 1.32* 1.69*  CALCIUM 9.3  --    < > 9.6 9.6 9.6 9.2 9.4  MG  --  2.4  --   --   --   --   --   --   PHOS 4.3  --   --   --   --   --   --   --    < > =  values in this interval not displayed.     GFR: Estimated Creatinine Clearance: 40.2 mL/min (A) (by C-G formula based on SCr of 1.69 mg/dL (H)).  Liver Function Tests: Recent Labs  Lab 01/19/21 1500  ALBUMIN 3.7     CBG: Recent Labs  Lab 01/24/21 2117 01/25/21 0632 01/25/21 1138  GLUCAP 149* 207* 201*     Recent Results (from the past 240 hour(s))  Resp Panel by RT-PCR (Flu A&B, Covid) Nasopharyngeal Swab     Status: None   Collection Time: 01/18/21  8:46 PM   Specimen: Nasopharyngeal Swab; Nasopharyngeal(NP) swabs in vial transport medium  Result Value Ref Range Status   SARS Coronavirus 2 by RT PCR NEGATIVE NEGATIVE Final    Comment: (NOTE) SARS-CoV-2 target nucleic acids are NOT DETECTED.  The SARS-CoV-2 RNA is generally detectable in upper respiratory specimens during the acute phase of infection. The lowest concentration of SARS-CoV-2 viral copies this assay can detect is 138 copies/mL. A negative result does not preclude SARS-Cov-2 infection and should not be used as the sole basis for treatment or other patient management decisions. A negative result may occur with  improper specimen collection/handling, submission of specimen other than nasopharyngeal swab, presence of viral mutation(s) within the areas targeted by this assay, and inadequate number of viral copies(<138 copies/mL). A negative result must be combined with clinical observations, patient history, and epidemiological information. The expected result is Negative.  Fact Sheet for Patients:  EntrepreneurPulse.com.au  Fact Sheet for Healthcare Providers:  IncredibleEmployment.be  This test  is no t yet approved or cleared by the [place] FDA and  has been authorized for detection and/or diagnosis of SARS-CoV-2 by FDA under an Emergency Use Authorization (EUA). This EUA will remain  in effect (meaning this test can be used) for the duration of the COVID-19 declaration under Section 564(b)(1) of the Act, 21 U.S.C.section 360bbb-3(b)(1), unless the authorization is terminated  or revoked sooner.       Influenza A by PCR NEGATIVE NEGATIVE Final   Influenza B by PCR NEGATIVE NEGATIVE Final    Comment: (NOTE) The Xpert Xpress SARS-CoV-2/FLU/RSV plus assay is intended as an aid in the diagnosis of influenza from Nasopharyngeal swab specimens and should not be used as a sole basis for treatment. Nasal washings and aspirates are unacceptable for Xpert Xpress SARS-CoV-2/FLU/RSV testing.  Fact Sheet for Patients: EntrepreneurPulse.com.au  Fact Sheet for Healthcare Providers: IncredibleEmployment.be  This test is not yet approved or cleared by the [place] FDA and has been authorized for detection and/or diagnosis of SARS-CoV-2 by FDA under an Emergency Use Authorization (EUA). This EUA will remain in effect (meaning this test can be used) for the duration of the COVID-19 declaration under Section 564(b)(1) of the Act, 21 U.S.C. section 360bbb-3(b)(1), unless the authorization is terminated or revoked.  Performed at [facility], [place] [address] [place], [place] [address]   MRSA Next Gen by PCR, Nasal     Status: None   Collection Time: 01/20/21  4:00 AM   Specimen: Nasal Mucosa; Nasal Swab  Result Value Ref Range Status   MRSA by PCR Next Gen NOT DETECTED NOT DETECTED Final    Comment: (NOTE) The GeneXpert MRSA Assay (FDA approved for NASAL specimens only), is one component of a comprehensive MRSA colonization surveillance program. It is not intended to diagnose MRSA infection nor to guide or monitor treatment for MRSA  infections. Test performance is not FDA approved in patients less than 10 years old. Performed  at [facility], [place] [address] [place], [place] [address]           Radiology Studies: No results found.      Scheduled Meds:  allopurinol  300 mg Oral Daily   amitriptyline  75 mg Oral QHS   anastrozole  1 mg Oral Daily   atorvastatin  20 mg Oral Daily   cephALEXin  500 mg Oral TID   DULoxetine  60 mg Oral Daily   enoxaparin (LOVENOX) injection  40 mg Subcutaneous Q24H   insulin aspart  0-5 Units Subcutaneous QHS   insulin aspart  0-9 Units Subcutaneous TID WC   levothyroxine  75 mcg Oral Q0600   metoprolol succinate  50 mg Oral Daily   mometasone-formoterol  2 puff Inhalation BID   pantoprazole  40 mg Oral Q0600   primidone  50 mg Oral BID   QUEtiapine  25 mg Oral QHS   rOPINIRole  0.75 mg Oral QHS   vitamin B-12  1,000 mcg Oral Daily   Continuous Infusions:   LOS: 5 days        Hosie Poisson, MD [facility]   To contact the attending provider between 7A-7P or the covering provider during after hours 7P-7A, please log into the web site [email or web address] and access using universal [place] password for that web site. If you do not have the password, please call the hospital operator.  01/25/2021, 1:54 PM

## 2021-01-25 NOTE — Progress Notes (Signed)
Physical Therapy Treatment Patient Details Name: Brandi Stephens MRN: [id number] DOB: April 28, 1945 Today's Date: 01/25/2021   History of Present Illness Brandi Stephens is a 75 y/o female admitted observation status 01/18/21 with tachycardic and hypoxic with oxygen saturation 88% on room air. CT angiogram chest showing ill-defined area of low-attenuation involving a lower lobe branch of the right pulmonary artery without definite evidence of intraluminal filling defects; small PE not excluded. 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 Reports limited mobility at baseline due to chronic back problems.    PT Comments    The pt continues to make good progress with mobility this session as she demos improved stability and ability to navigate with trial of 4-wheel walker. The pt was able to self-monitor for fatigue, but did require increase in O2 from 2 to 3L with ambulation as SpO2 dropped to 88%. The pt was then educated on LE exercises to improve activation and mobility throughout day outside of PT sessions and pt verbalized agreement. Will continue to benefit from skilled PT to progress mobility and balance.     Recommendations for follow up therapy are one component of a multi-disciplinary discharge planning process, led by the attending physician.  Recommendations may be updated based on patient status, additional functional criteria and insurance authorization.  Follow Up Recommendations  Home health PT     Equipment Recommendations  None recommended by PT    Recommendations for Other Services       Precautions / Restrictions Precautions Precautions: Fall Precaution Comments: watch O2 Restrictions Weight Bearing Restrictions: No     Mobility  Bed  Mobility Overal bed mobility: Independent Bed Mobility: Supine to Sit;Sit to Supine           General bed mobility comments: no assist, pt able to complete within normal time    Transfers Overall transfer level: Independent Equipment used: None Transfers: Sit to/from Stand Sit to Stand: Independent         General transfer comment: pt rising and transfering to [facility] without issue or instability  Ambulation/Gait Ambulation/Gait assistance: Min guard Gait Distance (Feet): 200 Feet Assistive device: 4-wheeled walker Gait Pattern/deviations: Step-through pattern;Decreased stride length;Trunk flexed Gait velocity: too quick for safety at times   General Gait Details: pt with progressive trunk flexion, walking faster than safe at times and asking how to slow down walker (pt then cued to walk more slowly in order to slow down the walker). reports increase in chronic back pain this morning limiting ambulation       Balance Overall balance assessment: Mild deficits observed, not formally tested                                          Cognition Arousal/Alertness: Awake/alert Behavior During Therapy: WFL for tasks assessed/performed Overall Cognitive Status: No family/caregiver present to determine baseline cognitive functioning Area of Impairment: Attention;Memory;Safety/judgement                   Current Attention Level: Selective Memory: Decreased short-term memory   Safety/Judgement: Decreased awareness of safety     General Comments: pt eager to participate, demos slightly decreased insight to deficits and medical complexity of deficits      Exercises General Exercises - Lower Extremity  Long Arc Quad: AROM;Both;10 reps;Seated Heel Slides: AROM;Both;10 reps;Seated Hip Flexion/Marching: AROM;Both;10 reps;Seated Heel Raises: AROM;Both;10 reps;Seated    General Comments General comments (skin integrity, edema, etc.): Pt needing 3L to  maintain >90% with ambulaiton, returned to 2L with good pleth and >90% at rest      Pertinent Vitals/Pain Pain Assessment: Faces Faces Pain Scale: Hurts whole lot Pain Location: back after ambulation Pain Descriptors / Indicators: Discomfort;Sharp Pain Intervention(s): Limited activity within patient's tolerance;Monitored during session;Repositioned     PT Goals (current goals can now be found in the care plan section) Acute Rehab PT Goals Patient Stated Goal: to get home and decreased edema PT Goal Formulation: With patient Time For Goal Achievement: 02/02/21 Potential to Achieve Goals: Good Progress towards PT goals: Progressing toward goals    Frequency    Min 3X/week      PT Plan Current plan remains appropriate       AM-PAC PT "6 Clicks" Mobility   Outcome Measure  Help needed turning from your back to your side while in a flat bed without using bedrails?: None Help needed moving from lying on your back to sitting on the side of a flat bed without using bedrails?: None Help needed moving to and from a bed to a chair (including a wheelchair)?: None Help needed standing up from a chair using your arms (e.g., wheelchair or bedside chair)?: None Help needed to walk in hospital room?: A Little Help needed climbing 3-5 steps with a railing? : A Little 6 Click Score: 22    End of Session Equipment Utilized During Treatment: Gait belt;Oxygen Activity Tolerance: Patient limited by pain (chronic back pain) Patient left: with call bell/phone within reach;in chair;with family/visitor present Nurse Communication: Mobility status PT Visit Diagnosis: Other abnormalities of gait and mobility (R26.89)     Time: 8250-0370 PT Time Calculation (min) (ACUTE ONLY): 33 min  Charges:  $Gait Training: 8-22 mins $Therapeutic Exercise: 8-22 mins                     West Carbo, PT, DPT   Acute Rehabilitation Department Pager #: [phone]   Sandra Cockayne 01/25/2021,  9:31 AM

## 2021-01-26 ENCOUNTER — Telehealth: Payer: PPO | Admitting: Family Medicine

## 2021-01-26 LAB — CREATININE, SERUM
Creatinine, Ser: 1.43 mg/dL — ABNORMAL HIGH (ref 0.44–1.00)
GFR, Estimated: 38 mL/min — ABNORMAL LOW (ref >60)

## 2021-01-26 LAB — GLUCOSE, CAPILLARY
Glucose-Capillary: 185 mg/dL — ABNORMAL HIGH (ref 70–99)
Glucose-Capillary: 354 mg/dL — ABNORMAL HIGH (ref 70–99)

## 2021-01-26 MED ORDER — IPRATROPIUM-ALBUTEROL 0.5-2.5 (3) MG/3ML IN SOLN
3.0000 mL | Freq: Four times a day (QID) | RESPIRATORY_TRACT | 3 refills | Status: DC | PRN
Start: 2021-01-26 — End: 2021-04-29

## 2021-01-26 MED ORDER — LIVING WELL WITH DIABETES BOOK
Freq: Once | Status: DC
  Filled 2021-01-26 (×2): qty 1

## 2021-01-26 MED ORDER — BUMETANIDE 1 MG PO TABS: 1.0000 mg | ORAL_TABLET | Freq: Every day | ORAL | 11 refills | Status: DC

## 2021-01-26 MED ORDER — DIAZEPAM 5 MG PO TABS: 5.0000 mg | ORAL_TABLET | Freq: Every day | ORAL | 0 refills | Status: DC | PRN

## 2021-01-26 MED ORDER — BLOOD GLUCOSE MONITOR KIT: PACK | 0 refills | Status: DC

## 2021-01-26 MED ORDER — OXYCODONE-ACETAMINOPHEN 5-325 MG PO TABS: 1.0000 | ORAL_TABLET | Freq: Four times a day (QID) | ORAL | 0 refills | Status: DC | PRN

## 2021-01-26 MED ORDER — PANTOPRAZOLE SODIUM 40 MG PO TBEC: 40.0000 mg | DELAYED_RELEASE_TABLET | Freq: Every day | ORAL | 0 refills | Status: DC

## 2021-01-26 MED ORDER — METOPROLOL SUCCINATE ER 50 MG PO TB24: 50.0000 mg | ORAL_TABLET | Freq: Every day | ORAL | 2 refills | Status: DC

## 2021-01-26 NOTE — TOC Transition Note (Signed)
Transition of Care [facility]) - CM/SW Discharge Note   Patient Details  Name: Brandi Stephens MRN: [id number] Date of Birth: July 17, 1945  [facility] CM/SW Contact:  Ella Bodo, RN Phone Number: 01/26/2021, 2:38 PM   Clinical Narrative:    Patient medically stable for discharge home today with spouse and home health services as previously arranged.  Home oxygen has been delivered to room by [facility].  Patient appreciative of all assistance given; states has been able to provide care at discharge.   Final next level of care: [place] Barriers to Discharge: Barriers Resolved   Patient Goals and CMS Choice Patient states their goals for this hospitalization and ongoing recovery are:: return home with [facility] CMS Medicare.gov Compare Post Acute Care list provided to:: Patient Choice offered to / list presented to : Patient                      Discharge Plan and Services   Discharge Planning Services: CM Consult Post Acute Care Choice: Home Health          DME Arranged: Oxygen DME Agency: [employer] Date DME Agency Contacted: 01/25/21 Time DME Agency Contacted: [phone] Representative spoke with at DME Agency: Freda Munro HH Arranged: PT, OT, RN [facility] Agency: [place] ([place]) Date [place]: 01/22/21   Representative spoke with at [place]: [place] ([place]) Interventions     Readmission Risk Interventions Readmission Risk Prevention Plan 08/02/2019  Transportation Screening Complete  PCP or Specialist appointment within 3-5 days of discharge (No Data)  [place] or [place] Complete  SW Recovery Care/Counseling Consult Complete  Palliative Care Screening Not [place] Not Applicable  Some recent data might be hidden   Reinaldo Raddle, RN, BSN  Trauma/Neuro ICU Case Manager [phone]

## 2021-01-26 NOTE — Progress Notes (Signed)
Inpatient Diabetes Program Recommendations  AACE/ADA: New Consensus Statement on Inpatient Glycemic Control  Target Ranges:  Prepandial:   less than 140 mg/dL      Peak postprandial:   less than 180 mg/dL (1-2 hours)      Critically ill patients:  140 - 180 mg/dL  Results for JENNI, THEW (MRN [id number]) as of 01/26/2021 13:46  Ref. Range 01/25/2021 06:32 01/25/2021 11:38 01/25/2021 15:41 01/25/2021 21:04 01/26/2021 06:38 01/26/2021 11:30  Glucose-Capillary Latest Ref Range: 70 - 99 mg/dL 207 (H) 201 (H) 167 (H) 168 (H) 185 (H) 354 (H)   Results for CAM, HARNDEN (MRN [id number]) as of 01/26/2021 13:46  Ref. Range 06/04/2020 15:11 01/19/2021 04:03  Hemoglobin A1C Latest Ref Range: 4.8 - 5.6 % 6.4 (H) 7.0 (H)   Review of Glycemic Control  Diabetes history: PreDM Outpatient Diabetes medications: None Current orders for Inpatient glycemic control: Novolog 0-9 units TID, Novolog 0-5 units QHS  Inpatient Diabetes Program Recommendations:    HbgA1C:  A1C 7% on 01/19/21 indicating an average glucose of 154 mg/dl over the past 2-3 months.  NOTE: Spoke with patient over the phone. Patient reports that she has had hx of prediabetes and she has never been on any DM medication. Patient states that she is not aware of what an A1C is. Discussed A1C results (7% on 01/19/21) and explained what an A1C is and informed patient that current A1C indicates an average glucose of 154 mg/dl over the past 2-3 months. Discussed basic pathophysiology of DM Type 2, basic home care, importance of checking CBGs and maintaining good CBG control to prevent long-term and short-term complications. Reviewed glucose and A1C goals.  Discussed impact of nutrition, exercise, stress, sickness, and medications on diabetes control.  Patient states she will plan to start watching her carbohydrate intake. She also notes that she is in the process of getting qualified for gastric bypass surgery and she is hopeful she will have the  surgery in the near future. Patient reports her mobility is limited due to chronic back issues.  Asked patient to check glucose at least 2 times per day and be sure to take glucometer to appointments so PCP can review and decide if she needs to be started on DM medication.  Informed patient that outpatient DM education has been ordered and she should be getting a call from [facility] after discharge. Patient verbalized understanding of information discussed and she states that she has no further questions at this time related to diabetes. Ordered Living Well with DM book and noted to send asap since patient is to be discharged today.  Thanks, Barnie Alderman, RN, MSN, CDE Diabetes Coordinator Inpatient Diabetes Program [phone] (Team Pager from 8am to 5pm)

## 2021-01-26 NOTE — Plan of Care (Signed)

## 2021-01-26 NOTE — Progress Notes (Signed)
Discharge instructions reviewed with patient, verbalized understanding. Written copy of instructions given to patient. Belongings collected and packed. Diabetes Coordinator spoke with patient. Via Wheelchair to daughter's waiting car in stable condition

## 2021-01-27 ENCOUNTER — Telehealth: Payer: Self-pay

## 2021-01-27 DIAGNOSIS — J9601 Acute respiratory failure with hypoxia: Secondary | ICD-10-CM | POA: Diagnosis not present

## 2021-01-27 NOTE — Discharge Summary (Signed)
Physician Discharge Summary  Brandi Stephens [id number]:[id number] DOB: November 08, 1945 DOA: 01/18/2021  PCP: Girtha Rm, PA-C  Admit date: 01/18/2021 Discharge date: 01/26/2021  Admitted From: Home.  Disposition:  Home.   Recommendations for Outpatient Follow-up:  Follow up with PCP in 1-2 weeks Please obtain BMP/CBC in one week Please follow up  with pulmonology for sleep study.  Please follow up with cardiology as scheduled.  Please follow up with bariatric surgery   Discharge Condition: stable.  CODE STATUS: full code.  Diet recommendation: Heart Healthy / Carb Modified   Brief/Interim Summary: 75 year old F with PMH of diastolic CHF, COPD not on O2, CKD-3A, HTN, HLD, hypothyroidism, morbid obesity, breast cancer s/p left breast lumpectomy, radiation and hormonal therapy, anxiety, depression and RLS sent to ED from PCP office with hypoxemia and tachycardia.  She went to see PCP for lower extremity edema.  She was admitted with working diagnosis of possible PE that could not be excluded by CTA chest.  She was started on IV heparin.  VQ scan and LE venous Doppler were ordered.   The next day, VQ scan and LE venous US negative for VTE.  Heparin discontinued.  She was ambulated and desaturated to 79% on RA.  Started on IV Lasix with improvement in the breathing and pedal edema.  Will discharge her on 3lit of [place] oxygen.   Discharge Diagnoses:  Principal Problem:   Acute hypoxemic respiratory failure (HCC) Active Problems:   Essential hypertension   COPD (chronic obstructive pulmonary disease) (HCC)   Prediabetes   CKD (chronic kidney disease), stage III   Inappropriate sinus tachycardia   Muscular deconditioning   Acute respiratory failure with hypoxia (HCC)   Morbid obesity (HCC)  Acute respiratory failure with hypoxia probably secondary to combination of acute on chronic diastolic heart failure in the setting of emphysema/COPD. Improving with IV lasix. So far she has received about  2 doses Echocardiogram showed Left ventricular ejection fraction is 60 to 65% with normal function. There is mild left ventricular hypertrophy. Left ventricular diastolic parameters are consistent with Grade I diastolic dysfunction (impaired relaxation). Aortic dilatation noted. There is mild dilatation of the ascending aorta.  PE is ruled out by CTA of the chest and VQ scan.  Currently on 1 to 3 L of nasal cannula oxygen to keep sats greater than 90% Creatinine appears to be  back to baseline.  Cardiology consulted , transitioned to bumex as she is not responding to oral lasix at home . Pt diuresing well , so far 3 lit since admission.  She was discharged on oral bumex and recommended to follow up with cardiology as scheduled.      Chronic COPD No wheezing on exam on today.  Continue with Dulera and duo nebs as needed.     Acute on Stage IIIa CKD Baseline creatinine around 1.1. creatinine slightly  worsened to 1.6, but improved to 1.4.  Recommend checking BMP in one week.   History of gout Continue with allopurinol.     Hypertension Blood pressure parameters are optimal.  Continue with metoprolol.      Hypothyroidism Continue with Synthroid.   Back pain;  S/p posterior fusion surgery at L2  to S1. Complicated by paraspinal abscesses.  On keflex for chronic suppression.  Follows up with Dr Megan Salon with ID as outpatient.      History of breast cancer s/p left lumpectomy in 2017, radiation and hormonal therapy Continue with home anastrozole.   Conditioning/debility Therapy evaluation recommending home  health PT on discharge. Unable to wean her off the oxygen, will plan for oxygen on discharge.  Plan for discharge when her renal parameters improve.      Diabetes mellitus type 2 uncontrolled with hyperglycemia , non insulin dependent.   Hemoglobin A1c is 7.  Started her on SSI.     Discharge Instructions  Discharge Instructions     Ambulatory referral to  Pulmonology   Complete by: As directed    Sleep study   Reason for referral: Asthma/COPD   Diet - low sodium heart healthy   Complete by: As directed    Diet - low sodium heart healthy   Complete by: As directed    Increase activity slowly   Complete by: As directed       Allergies as of 01/26/2021       Reactions   Ampicillin Hives, Other (See Comments)   Severe reaction in February 2017 1.5 month to have hives to go away   Gabapentin Swelling   UNSPECIFIED REACTION    Penicillins Hives, Other (See Comments)   Severe reaction in February 2017 1.5 month to have hives to go away Has patient had a PCN reaction causing immediate rash, facial/tongue/throat swelling, SOB or lightheadedness with hypotension: #  #  #  YES  #  #  #  Has patient had a PCN reaction causing severe rash involving mucus membranes or skin necrosis: No Has patient had a PCN reaction that required hospitalization No Has patient had a PCN reaction occurring within the last 10 years: No   Codeine Nausea And Vomiting   Other Itching   UNSPECIFIED Analgesics        Medication List     STOP taking these medications    furosemide 40 MG tablet Commonly known as: LASIX       TAKE these medications    albuterol 108 (90 Base) MCG/ACT inhaler Commonly known as: VENTOLIN HFA INHALE 2 PUFFS BY MOUTH EVERY 4 TO 6 HOURS AS NEEDED What changed:  how much to take how to take this when to take this reasons to take this additional instructions   allopurinol 300 MG tablet Commonly known as: ZYLOPRIM 1 daily What changed:  how much to take how to take this when to take this   amitriptyline 75 MG tablet Commonly known as: ELAVIL TAKE 1 TABLET(75 MG) BY MOUTH AT BEDTIME What changed:  how much to take how to take this when to take this additional instructions   anastrozole 1 MG tablet Commonly known as: ARIMIDEX TAKE 1 TABLET(1 MG) BY MOUTH DAILY What changed: See the new instructions.    atorvastatin 20 MG tablet Commonly known as: LIPITOR TAKE 1 TABLET BY MOUTH EVERY DAY   betamethasone dipropionate 0.05 % cream Apply 1 application topically daily as needed (irritated skin).   Biotin 3 MG Tabs Take 3 mg by mouth daily.   blood glucose meter kit and supplies Kit Dispense based on patient and insurance preference. Use up to four times daily as directed.   bumetanide 1 MG tablet Commonly known as: BUMEX Take 1 tablet (1 mg total) by mouth daily.   cephALEXin 500 MG capsule Commonly known as: KEFLEX TAKE 1 CAPSULE(500 MG) BY MOUTH THREE TIMES DAILY What changed:  how much to take how to take this when to take this additional instructions   D3 ADULT PO Take 1 capsule by mouth daily.   diazepam 5 MG tablet Commonly known as: Valium Take 1 tablet (  5 mg total) by mouth daily as needed for anxiety.   diclofenac Sodium 1 % Gel Commonly known as: VOLTAREN Apply 4 g topically 4 (four) times daily.   docusate sodium 100 MG capsule Commonly known as: COLACE Take 1 capsule (100 mg total) by mouth 3 (three) times daily. What changed: when to take this   DULoxetine 60 MG capsule Commonly known as: CYMBALTA TAKE 1 CAPSULE(60 MG) BY MOUTH DAILY What changed: See the new instructions.   Fluticasone-Salmeterol 250-50 MCG/DOSE Aepb Commonly known as: ADVAIR Inhale 1 puff into the lungs every 12 (twelve) hours.   ipratropium-albuterol 0.5-2.5 (3) MG/3ML Soln Commonly known as: DUONEB Take 3 mLs by nebulization every 6 (six) hours as needed.   levothyroxine 75 MCG tablet Commonly known as: SYNTHROID TAKE 1 TABLET(75 MCG) BY MOUTH DAILY BEFORE BREAKFAST What changed:  how much to take how to take this when to take this additional instructions   lidocaine 5 % Commonly known as: LIDODERM UNWRAP AND APPLY 2 PATCHES TO SKIN DAILY, REMOVE AND DISCARD PATCH WITHIN 12 HOURS OR AS DIRECTED BY DOCTOR What changed: See the new instructions.   magnesium gluconate  500 MG tablet Commonly known as: MAGONATE Take 2 tablets (1,000 mg total) by mouth at bedtime.   metoprolol succinate 50 MG 24 hr tablet Commonly known as: TOPROL-XL Take 1 tablet (50 mg total) by mouth daily. Take with or immediately following a meal. What changed:  medication strength how much to take additional instructions   OMEGA-3 FISH OIL PO Take 1 capsule by mouth daily.   oxyCODONE-acetaminophen 5-325 MG tablet Commonly known as: PERCOCET/ROXICET Take 1 tablet by mouth every 6 (six) hours as needed for moderate pain or severe pain.   pantoprazole 40 MG tablet Commonly known as: PROTONIX Take 1 tablet (40 mg total) by mouth daily at 6 (six) AM.   polyethylene glycol powder 17 GM/SCOOP powder Commonly known as: GLYCOLAX/MIRALAX Take 17 g by mouth daily as needed for moderate constipation.   primidone 50 MG tablet Commonly known as: MYSOLINE TAKE 1 TABLET BY MOUTH TWICE DAILY   PROBIOTIC DAILY PO Take 1 tablet by mouth daily.   QUEtiapine 25 MG tablet Commonly known as: SEROQUEL TAKE 1 TABLET(25 MG) BY MOUTH AT BEDTIME What changed:  how much to take how to take this when to take this additional instructions   rOPINIRole 0.5 MG tablet Commonly known as: REQUIP TAKE 1 AND 1/2 TABLETS(0.75 MG) BY MOUTH AT BEDTIME What changed:  how much to take how to take this when to take this additional instructions   SYSTANE BALANCE OP Place 1 drop into both eyes 4 (four) times daily as needed (dry eyes).   vitamin B-12 1000 MCG tablet Commonly known as: CYANOCOBALAMIN Take 1 tablet (1,000 mcg total) by mouth daily.   Vitamin D (Ergocalciferol) 50000 units Caps Take 1 tablet by mouth once a week. What changed:  how much to take additional instructions        Follow-up Information     Roby Lofts M., PA-C Follow up.   Specialties: Physician Assistant, Cardiology Why: [facility] location - a cardiology follow-up has been arranged for  you on Wednesday Feb 17, 2021 2:45 PM (Arrive by 2:30 PM). Daleen Snook is one of our PAs that you have met before with Dr. Lysbeth Penner team. Contact information: [address] [place] [address] [place] [address] [place] Follow up.   Why: The  home health agency will contact you for the first home visit. Contact information: [phone]        Girtha Rm, PA-C. Schedule an appointment as soon as possible for a visit in 1 week(s).   Specialty: Family Medicine Contact information: [place] [address] [phone]         [name], MD .   Specialty: Cardiology Contact information: [address] [place] [address] [phone]                Allergies  Allergen Reactions   Ampicillin Hives and Other (See Comments)    Severe reaction in February 2017 1.5 month to have hives to go away   Gabapentin Swelling    UNSPECIFIED REACTION    Penicillins Hives and Other (See Comments)    Severe reaction in February 2017 1.5 month to have hives to go away Has patient had a PCN reaction causing immediate rash, facial/tongue/throat swelling, SOB or lightheadedness with hypotension: #  #  #  YES  #  #  #  Has patient had a PCN reaction causing severe rash involving mucus membranes or skin necrosis: No Has patient had a PCN reaction that required hospitalization No Has patient had a PCN reaction occurring within the last 10 years: No    Codeine Nausea And Vomiting   Other Itching    UNSPECIFIED Analgesics    Consultations: None.    Procedures/Studies: DG Chest 1 View  Result Date: 01/18/2021 CLINICAL DATA:  Shortness of breath. EXAM: CHEST  1 VIEW COMPARISON:  Chest x-ray 05/20/2020. FINDINGS: Heart is mildly enlarged. There is no pleural effusion or pneumothorax. There is some strandy interstitial opacities in the left lower lung. Surgical clips are seen in the left axilla. No acute fractures  are identified. IMPRESSION: 1. Minimal opacities in the left lower lung may represent atelectasis/scarring or mild infection. 2. Stable cardiomegaly. Electronically Signed   By: Ronney Asters M.D.   On: 01/18/2021 16:01   CT Angio Chest PE W and/or Wo Contrast  Result Date: 01/18/2021 CLINICAL DATA:  Decreased oxygen saturation. EXAM: CT ANGIOGRAPHY CHEST WITH CONTRAST TECHNIQUE: Multidetector CT imaging of the chest was performed using the standard protocol during bolus administration of intravenous contrast. Multiplanar CT image reconstructions and MIPs were obtained to evaluate the vascular anatomy. CONTRAST:  51m OMNIPAQUE IOHEXOL 350 MG/ML SOLN COMPARISON:  May 22, 2020 FINDINGS: Cardiovascular: There is mild calcification of the aortic arch. The subsegmental pulmonary arteries are limited in evaluation secondary to areas of overlying artifact and suboptimal opacification with intravenous contrast. An ill-defined area of low attenuation is seen involving a lower lobe branch of the right pulmonary artery without definite evidence of intraluminal filling defects. Normal heart size. No pericardial effusion. Mediastinum/Nodes: No enlarged mediastinal, hilar, or axillary lymph nodes. Thyroid gland, trachea, and esophagus demonstrate no significant findings. Lungs/Pleura: Mild atelectasis is seen within the bilateral lung bases. There is no evidence of a pleural effusion or pneumothorax. Upper Abdomen: No acute abnormality. Musculoskeletal: No chest wall abnormality. No acute or significant osseous findings. Review of the MIP images confirms the above findings. IMPRESSION: 1. Ill-defined area of low attenuation involving a lower lobe branch of the right pulmonary artery without definite evidence of intraluminal filling defects. A small amount of pulmonary embolism can not completely be excluded. Further evaluation with a nuclear medicine ventilation/perfusion scan is recommended. 2. Mild bibasilar  atelectasis. 3. Aortic atherosclerosis. Aortic Atherosclerosis (ICD10-I70.0). Electronically Signed   By:  Virgina Norfolk M.D.   On: 01/18/2021 20:21   NM Pulmonary Perfusion  Result Date: 01/19/2021 CLINICAL DATA:  Decreased oxygen saturation. Shortness of breath. Increased heart rate. Patient underwent a CTA chest yesterday, findings with a possible small embolus a segmental right lower lobe branch. EXAM: NUCLEAR MEDICINE PERFUSION LUNG SCAN TECHNIQUE: Perfusion images were obtained in multiple projections after intravenous injection of radiopharmaceutical. Ventilation scans intentionally deferred if perfusion scan and chest x-ray adequate for interpretation during COVID 19 epidemic. RADIOPHARMACEUTICALS:  4.4 mCi Tc-67mMAA IV COMPARISON:  CTA chest and chest radiographs from 01/18/2021. FINDINGS: There is nonsegmental patchy decreased perfusion to the middle and upper lungs bilaterally most consistent with emphysema and/or shunting firm small airways disease. There are no convincing segmental defects to suggest pulmonary thromboembolism. IMPRESSION: 1. Low probability study for pulmonary thromboembolism. Areas of nonsegmental decreased perfusion predominate in upper lungs consistent with changes from emphysema and/or small airways disease. Electronically Signed   By: DLajean ManesM.D.   On: 01/19/2021 11:49   MYOCARDIAL PERFUSION IMAGING  Result Date: 01/07/2021   The study is normal. The study is low risk.   No ST deviation was noted.   LV perfusion is normal.   Left ventricular function is normal. Nuclear stress EF: 60 %. The left ventricular ejection fraction is severely decreased (<30%). End diastolic cavity size is normal.   Prior study available for comparison from 01/22/2016. Low risk stress nuclear study with normal perfusion and normal left ventricular regional and global systolic function.   VAS [place]OWER EXTREMITY VENOUS (DVT)  Result Date: 01/19/2021  Lower Venous DVT Study Patient Name:   MBRECKLYNN Stephens Date of Exam:   01/19/2021 Medical Rec #: [id number]        Accession #:    [id number] of Birth: 5October 18, 1947        Patient Gender: F Patient Age:   89years [facility]:      VAS [place]OWER EXTREMITY VENOUS (DVT) Referring Phys: VWandra FeinsteinRATHORE --------------------------------------------------------------------------------  Indications: Swelling, and Edema.  Comparison Study: 05/12/20 prior Performing Technologist: MArchie PattenRVS  Examination Guidelines: A complete evaluation includes B-mode imaging, spectral Doppler, color Doppler, and power Doppler as needed of all accessible portions of each vessel. Bilateral testing is considered an integral part of a complete examination. Limited examinations for reoccurring indications may be performed as noted. The reflux portion of the exam is performed with the patient in reverse Trendelenburg.  +---------+---------------+---------+-----------+----------+--------------+ RIGHT    CompressibilityPhasicitySpontaneityPropertiesThrombus Aging +---------+---------------+---------+-----------+----------+--------------+ CFV      Full           Yes      Yes                                 +---------+---------------+---------+-----------+----------+--------------+ SFJ      Full                                                        +---------+---------------+---------+-----------+----------+--------------+ FV Prox  Full                                                        +---------+---------------+---------+-----------+----------+--------------+  FV Mid   Full                                                        +---------+---------------+---------+-----------+----------+--------------+ FV DistalFull                                                        +---------+---------------+---------+-----------+----------+--------------+ PFV      Full                                                         +---------+---------------+---------+-----------+----------+--------------+ POP      Full           Yes      Yes                                 +---------+---------------+---------+-----------+----------+--------------+ PTV      Full                                                        +---------+---------------+---------+-----------+----------+--------------+ PERO     Full                                                        +---------+---------------+---------+-----------+----------+--------------+   +---------+---------------+---------+-----------+----------+--------------+ LEFT     CompressibilityPhasicitySpontaneityPropertiesThrombus Aging +---------+---------------+---------+-----------+----------+--------------+ CFV      Full           Yes      Yes                                 +---------+---------------+---------+-----------+----------+--------------+ SFJ      Full                                                        +---------+---------------+---------+-----------+----------+--------------+ FV Prox  Full                                                        +---------+---------------+---------+-----------+----------+--------------+ FV Mid   Full                                                        +---------+---------------+---------+-----------+----------+--------------+  FV DistalFull                                                        +---------+---------------+---------+-----------+----------+--------------+ PFV      Full                                                        +---------+---------------+---------+-----------+----------+--------------+ POP      Full           Yes      Yes                                 +---------+---------------+---------+-----------+----------+--------------+ PTV      Full                                                         +---------+---------------+---------+-----------+----------+--------------+ PERO     Full                                                        +---------+---------------+---------+-----------+----------+--------------+     Summary: BILATERAL: - No evidence of deep vein thrombosis seen in the lower extremities, bilaterally. -No evidence of popliteal cyst, bilaterally.   *See table(s) above for measurements and observations. Electronically signed by Deitra Mayo MD on 01/19/2021 at 5:39:35 PM.    Final    ECHOCARDIOGRAM LIMITED  Result Date: 01/20/2021    ECHOCARDIOGRAM LIMITED REPORT   Patient Name:   Brandi Stephens Date of Exam: 01/20/2021 Medical Rec #:  [id number]        Height:       67.0 in Accession #:    [id number]       Weight:       278.7 lb Date of Birth:  1946-03-27        BSA:          2.328 m Patient Age:    66 years         BP:           130/77 mmHg Patient Gender: F                HR:           95 bpm. Exam Location:  Inpatient Procedure: Limited Echo, 3D Echo, Cardiac Doppler and Color Doppler Indications:    R06.02 SOB  History:        Patient has prior history of Echocardiogram examinations, most                 recent 07/07/2020. Abnormal ECG, COPD; Risk Factors:Hypertension                 and Current Smoker. ETOH. Breast cancer.  Sonographer:    Roseanna Rainbow RDCS Referring Phys: [id number] Bretta Bang  T GONFA  Sonographer Comments: Technically difficult study due to poor echo windows and patient is morbidly obese. Image acquisition challenging due to patient body habitus. IMPRESSIONS  1. Limited echo for LV function  2. Left ventricular ejection fraction, by estimation, is 60 to 65%. The left ventricle has normal function. There is mild left ventricular hypertrophy. Left ventricular diastolic parameters are consistent with Grade I diastolic dysfunction (impaired relaxation).  3. Aortic dilatation noted. There is mild dilatation of the ascending aorta, measuring 41 mm.  4. The inferior vena  cava is dilated in size with >50% respiratory variability, suggesting right atrial pressure of 8 mmHg. Comparison(s): Changes from prior study are noted. 07/07/2020: LVEF 65-70%. FINDINGS  Left Ventricle: Left ventricular ejection fraction, by estimation, is 60 to 65%. The left ventricle has normal function. The left ventricular internal cavity size was normal in size. There is mild left ventricular hypertrophy. Left ventricular diastolic  parameters are consistent with Grade I diastolic dysfunction (impaired relaxation). Indeterminate filling pressures. Aorta: Aortic dilatation noted. There is mild dilatation of the ascending aorta, measuring 41 mm. Venous: The inferior vena cava is dilated in size with greater than 50% respiratory variability, suggesting right atrial pressure of 8 mmHg. LEFT VENTRICLE PLAX 2D LVIDd:         4.85 cm LVIDs:         3.10 cm LV PW:         1.20 cm LV IVS:        1.30 cm LVOT diam:     2.20 cm LVOT Area:     3.80 cm  LV Volumes (MOD) LV vol d, MOD A2C: 60.9 ml LV vol d, MOD A4C: 83.4 ml LV vol s, MOD A2C: 24.3 ml LV vol s, MOD A4C: 32.9 ml LV SV MOD A2C:     36.6 ml LV SV MOD A4C:     83.4 ml LV SV MOD BP:      42.4 ml LEFT ATRIUM         Index LA diam:    3.30 cm 1.42 cm/m   AORTA Ao Root diam: 3.70 cm Ao Asc diam:  4.10 cm MITRAL VALVE MV Area (PHT): 4.15 cm    SHUNTS MV Decel Time: 183 msec    Systemic Diam: 2.20 cm MV E velocity: 46.30 cm/s MV A velocity: 92.10 cm/s MV E/A ratio:  0.50 Lyman Bishop MD Electronically signed by Lyman Bishop MD Signature Date/Time: 01/20/2021/1:41:02 PM    Final       Subjective: No new complaints.   Discharge Exam: Vitals:   01/26/21 0730 01/26/21 1100  BP: (!) 137/52 (!) 131/58  Pulse: 78 83  Resp: 18 15  Temp:  98 F (36.7 C)  SpO2: 93% 92%   Vitals:   01/26/21 0316 01/26/21 0716 01/26/21 0730 01/26/21 1100  BP: 126/68 (!) 137/52 (!) 137/52 (!) 131/58  Pulse: 84 73 78 83  Resp:  _0 Temp: 98 F (36.7 C) 98 F (36.7  C)  98 F (36.7 C)  TempSrc: Oral Oral  Oral  SpO2: 90% 91% 93% 92%  Weight:      Height:        General: Pt is alert, awake, not in acute distress Cardiovascular: RRR, S1/S2 +, no rubs, no gallops Respiratory: CTA bilaterally, no wheezing, no rhonchi Abdominal: Soft, NT, ND, bowel sounds + Extremities: no edema, no cyanosis    The results of significant diagnostics from this hospitalization (including imaging, microbiology, ancillary  and laboratory) are listed below for reference.     Microbiology: Recent Results (from the past 240 hour(s))  Resp Panel by RT-PCR (Flu A&B, Covid) Nasopharyngeal Swab     Status: None   Collection Time: 01/18/21  8:46 PM   Specimen: Nasopharyngeal Swab; Nasopharyngeal(NP) swabs in vial transport medium  Result Value Ref Range Status   SARS Coronavirus 2 by RT PCR NEGATIVE NEGATIVE Final    Comment: (NOTE) SARS-CoV-2 target nucleic acids are NOT DETECTED.  The SARS-CoV-2 RNA is generally detectable in upper respiratory specimens during the acute phase of infection. The lowest concentration of SARS-CoV-2 viral copies this assay can detect is 138 copies/mL. A negative result does not preclude SARS-Cov-2 infection and should not be used as the sole basis for treatment or other patient management decisions. A negative result may occur with  improper specimen collection/handling, submission of specimen other than nasopharyngeal swab, presence of viral mutation(s) within the areas targeted by this assay, and inadequate number of viral copies(<138 copies/mL). A negative result must be combined with clinical observations, patient history, and epidemiological information. The expected result is Negative.  Fact Sheet for Patients:  EntrepreneurPulse.com.au  Fact Sheet for Healthcare Providers:  IncredibleEmployment.be  This test is no t yet approved or cleared by the [place] FDA and  has been authorized  for detection and/or diagnosis of SARS-CoV-2 by FDA under an Emergency Use Authorization (EUA). This EUA will remain  in effect (meaning this test can be used) for the duration of the COVID-19 declaration under Section 564(b)(1) of the Act, 21 U.S.C.section 360bbb-3(b)(1), unless the authorization is terminated  or revoked sooner.       Influenza A by PCR NEGATIVE NEGATIVE Final   Influenza B by PCR NEGATIVE NEGATIVE Final    Comment: (NOTE) The Xpert Xpress SARS-CoV-2/FLU/RSV plus assay is intended as an aid in the diagnosis of influenza from Nasopharyngeal swab specimens and should not be used as a sole basis for treatment. Nasal washings and aspirates are unacceptable for Xpert Xpress SARS-CoV-2/FLU/RSV testing.  Fact Sheet for Patients: EntrepreneurPulse.com.au  Fact Sheet for Healthcare Providers: IncredibleEmployment.be  This test is not yet approved or cleared by the [place] FDA and has been authorized for detection and/or diagnosis of SARS-CoV-2 by FDA under an Emergency Use Authorization (EUA). This EUA will remain in effect (meaning this test can be used) for the duration of the COVID-19 declaration under Section 564(b)(1) of the Act, 21 U.S.C. section 360bbb-3(b)(1), unless the authorization is terminated or revoked.  Performed at [facility], [place] [address], [place], [place] [address]   MRSA Next Gen by PCR, Nasal     Status: None   Collection Time: 01/20/21  4:00 AM   Specimen: Nasal Mucosa; Nasal Swab  Result Value Ref Range Status   MRSA by PCR Next Gen NOT DETECTED NOT DETECTED Final    Comment: (NOTE) The GeneXpert MRSA Assay (FDA approved for NASAL specimens only), is one component of a comprehensive MRSA colonization surveillance program. It is not intended to diagnose MRSA infection nor to guide or monitor treatment for MRSA infections. Test performance is not FDA approved in patients less than 62  years old. Performed at [facility], [place] [address], [place], [place] [address]      Labs: BNP (last 3 results) Recent Labs    05/18/20 0000 06/15/20 1037 01/18/21 1348  BNP 30.4 75.4 57.8   Basic Metabolic Panel: Recent Labs  Lab 01/21/21 0436 01/22/21 0936 01/23/21  6045 01/24/21 0045 01/25/21 0022 01/26/21 0518  NA 137 135 135 134* 136  --   K 4.2 4.1 3.9 4.0 4.0  --   CL 100 100 98 98 99  --   CO2 _0 --   GLUCOSE 184* 169* 158* 220* 185*  --   BUN 41* 36* 38* 39* 44*  --   CREATININE 1.27* 1.16* 1.23* 1.32* 1.69* 1.43*  CALCIUM 9.6 9.6 9.6 9.2 9.4  --    Liver Function Tests: No results for input(s): AST, ALT, ALKPHOS, BILITOT, PROT, ALBUMIN in the last 168 hours. No results for input(s): LIPASE, AMYLASE in the last 168 hours. No results for input(s): AMMONIA in the last 168 hours. CBC: Recent Labs  Lab 01/21/21 0436 01/22/21 0128 01/23/21 0108  WBC 8.8 9.3 8.6  HGB 12.1 12.6 12.6  HCT 37.7 37.2 38.0  MCV 100.3* 96.9 97.4  PLT 201 202 196   Cardiac Enzymes: No results for input(s): CKTOTAL, CKMB, CKMBINDEX, TROPONINI in the last 168 hours. BNP: Invalid input(s): POCBNP CBG: Recent Labs  Lab 01/25/21 1138 01/25/21 1541 01/25/21 2104 01/26/21 0638 01/26/21 1130  GLUCAP 201* 167* 168* 185* 354*   D-Dimer No results for input(s): DDIMER in the last 72 hours. Hgb A1c No results for input(s): HGBA1C in the last 72 hours. Lipid Profile No results for input(s): CHOL, HDL, LDLCALC, TRIG, CHOLHDL, LDLDIRECT in the last 72 hours. Thyroid function studies No results for input(s): TSH, T4TOTAL, T3FREE, THYROIDAB in the last 72 hours.  Invalid input(s): FREET3 Anemia work up No results for input(s): VITAMINB12, FOLATE, FERRITIN, TIBC, IRON, RETICCTPCT in the last 72 hours. Urinalysis    Component Value Date/Time   COLORURINE AMBER (A) 07/04/2019 1300   APPEARANCEUR CLOUDY (A) 07/04/2019 1300   LABSPEC 1.026 07/04/2019 1300    LABSPEC 1.025 06/24/2019 1426   PHURINE 5.0 07/04/2019 1300   GLUCOSEU NEGATIVE 07/04/2019 1300   HGBUR NEGATIVE 07/04/2019 1300   BILIRUBINUR MODERATE (A) 07/04/2019 1300   BILIRUBINUR moderate (A) 06/24/2019 1426   BILIRUBINUR n 11/25/2015 1712   KETONESUR NEGATIVE 07/04/2019 1300   PROTEINUR NEGATIVE 07/04/2019 1300   UROBILINOGEN negative 11/25/2015 1712   NITRITE NEGATIVE 07/04/2019 1300   LEUKOCYTESUR MODERATE (A) 07/04/2019 1300   Sepsis Labs Invalid input(s): PROCALCITONIN,  WBC,  LACTICIDVEN Microbiology Recent Results (from the past 240 hour(s))  Resp Panel by RT-PCR (Flu A&B, Covid) Nasopharyngeal Swab     Status: None   Collection Time: 01/18/21  8:46 PM   Specimen: Nasopharyngeal Swab; Nasopharyngeal(NP) swabs in vial transport medium  Result Value Ref Range Status   SARS Coronavirus 2 by RT PCR NEGATIVE NEGATIVE Final    Comment: (NOTE) SARS-CoV-2 target nucleic acids are NOT DETECTED.  The SARS-CoV-2 RNA is generally detectable in upper respiratory specimens during the acute phase of infection. The lowest concentration of SARS-CoV-2 viral copies this assay can detect is 138 copies/mL. A negative result does not preclude SARS-Cov-2 infection and should not be used as the sole basis for treatment or other patient management decisions. A negative result may occur with  improper specimen collection/handling, submission of specimen other than nasopharyngeal swab, presence of viral mutation(s) within the areas targeted by this assay, and inadequate number of viral copies(<138 copies/mL). A negative result must be combined with clinical observations, patient history, and epidemiological information. The expected result is Negative.  Fact Sheet for Patients:  EntrepreneurPulse.com.au  Fact Sheet for Healthcare Providers:  IncredibleEmployment.be  This test is no  t yet approved or cleared by the [place] and  has been  authorized for detection and/or diagnosis of SARS-CoV-2 by FDA under an Emergency Use Authorization (EUA). This EUA will remain  in effect (meaning this test can be used) for the duration of the COVID-19 declaration under Section 564(b)(1) of the Act, 21 U.S.C.section 360bbb-3(b)(1), unless the authorization is terminated  or revoked sooner.       Influenza A by PCR NEGATIVE NEGATIVE Final   Influenza B by PCR NEGATIVE NEGATIVE Final    Comment: (NOTE) The Xpert Xpress SARS-CoV-2/FLU/RSV plus assay is intended as an aid in the diagnosis of influenza from Nasopharyngeal swab specimens and should not be used as a sole basis for treatment. Nasal washings and aspirates are unacceptable for Xpert Xpress SARS-CoV-2/FLU/RSV testing.  Fact Sheet for Patients: EntrepreneurPulse.com.au  Fact Sheet for Healthcare Providers: IncredibleEmployment.be  This test is not yet approved or cleared by the [place] FDA and has been authorized for detection and/or diagnosis of SARS-CoV-2 by FDA under an Emergency Use Authorization (EUA). This EUA will remain in effect (meaning this test can be used) for the duration of the COVID-19 declaration under Section 564(b)(1) of the Act, 21 U.S.C. section 360bbb-3(b)(1), unless the authorization is terminated or revoked.  Performed at [facility], [place] [address] [place], [place] [address]   MRSA Next Gen by PCR, Nasal     Status: None   Collection Time: 01/20/21  4:00 AM   Specimen: Nasal Mucosa; Nasal Swab  Result Value Ref Range Status   MRSA by PCR Next Gen NOT DETECTED NOT DETECTED Final    Comment: (NOTE) The GeneXpert MRSA Assay (FDA approved for NASAL specimens only), is one component of a comprehensive MRSA colonization surveillance program. It is not intended to diagnose MRSA infection nor to guide or monitor treatment for MRSA infections. Test performance is not FDA approved in patients less than  62 years old. Performed at [facility], [place] [address] [place], [place] [address]      Time coordinating discharge: 32 minutes.  SIGNED:   Hosie Poisson, MD  [facility] 01/27/2021, 10:58 AM

## 2021-01-27 NOTE — Telephone Encounter (Signed)
Transition Care Management Follow-up Telephone Call Date of discharge and from where: [facility] 01/26/21 How have you been since you were released from the hospital? NO Any questions or concerns? Yes  Items Reviewed: Did the pt receive and understand the discharge instructions provided? Yes  Medications obtained and verified? Yes  Other? No  Any new allergies since your discharge? No  Dietary orders reviewed? No Do you have support at home? Yes   Home Care and Equipment/Supplies: Were home health services ordered? yes If so, what is the name of the agency? [facility]  Has the agency set up a time to come to the patient's home? yes Were any new equipment or medical supplies ordered?  Yes:   What is the name of the medical supply agency? [facility] Were you able to get the supplies/equipment? yes Do you have any questions related to the use of the equipment or supplies? No  Functional Questionnaire: (I = Independent and D = Dependent) ADLs: I  Bathing/Dressing- I  Meal Prep- I I Eating- I  Maintaining continence- I  Transferring/Ambulation- I  Managing Meds- I  Follow up appointments reviewed:  PCP Hospital f/u appt confirmed? Yes  Scheduled to see Dorothea Ogle on 02/04/21 @ [facility] f/u appt confirmed? Yes  Scheduled to see Roby Lofts on 02/17/21 @ 2:45. Are transportation arrangements needed? No  If their condition worsens, is the pt aware to call PCP or go to the Emergency Dept.? Yes Was the patient provided with contact information for the PCP's office or ED? Yes Was to pt encouraged to call back with questions or concerns? Yes

## 2021-01-29 DIAGNOSIS — K5909 Other constipation: Secondary | ICD-10-CM | POA: Diagnosis not present

## 2021-01-29 DIAGNOSIS — I5032 Chronic diastolic (congestive) heart failure: Secondary | ICD-10-CM | POA: Diagnosis not present

## 2021-01-29 DIAGNOSIS — Z9981 Dependence on supplemental oxygen: Secondary | ICD-10-CM | POA: Diagnosis not present

## 2021-01-29 DIAGNOSIS — K6282 Dysplasia of anus: Secondary | ICD-10-CM | POA: Diagnosis not present

## 2021-01-29 DIAGNOSIS — M47816 Spondylosis without myelopathy or radiculopathy, lumbar region: Secondary | ICD-10-CM | POA: Diagnosis not present

## 2021-01-29 DIAGNOSIS — G2581 Restless legs syndrome: Secondary | ICD-10-CM | POA: Diagnosis not present

## 2021-01-29 DIAGNOSIS — N1831 Chronic kidney disease, stage 3a: Secondary | ICD-10-CM | POA: Diagnosis not present

## 2021-01-29 DIAGNOSIS — E039 Hypothyroidism, unspecified: Secondary | ICD-10-CM | POA: Diagnosis not present

## 2021-01-29 DIAGNOSIS — E1142 Type 2 diabetes mellitus with diabetic polyneuropathy: Secondary | ICD-10-CM | POA: Diagnosis not present

## 2021-01-29 DIAGNOSIS — E1122 Type 2 diabetes mellitus with diabetic chronic kidney disease: Secondary | ICD-10-CM | POA: Diagnosis not present

## 2021-01-29 DIAGNOSIS — I130 Hypertensive heart and chronic kidney disease with heart failure and stage 1 through stage 4 chronic kidney disease, or unspecified chronic kidney disease: Secondary | ICD-10-CM | POA: Diagnosis not present

## 2021-01-29 DIAGNOSIS — M170 Bilateral primary osteoarthritis of knee: Secondary | ICD-10-CM | POA: Diagnosis not present

## 2021-01-29 DIAGNOSIS — E1151 Type 2 diabetes mellitus with diabetic peripheral angiopathy without gangrene: Secondary | ICD-10-CM | POA: Diagnosis not present

## 2021-01-29 DIAGNOSIS — G8929 Other chronic pain: Secondary | ICD-10-CM | POA: Diagnosis not present

## 2021-01-29 DIAGNOSIS — J9611 Chronic respiratory failure with hypoxia: Secondary | ICD-10-CM | POA: Diagnosis not present

## 2021-01-29 DIAGNOSIS — J9601 Acute respiratory failure with hypoxia: Secondary | ICD-10-CM | POA: Diagnosis not present

## 2021-01-29 DIAGNOSIS — M48061 Spinal stenosis, lumbar region without neurogenic claudication: Secondary | ICD-10-CM | POA: Diagnosis not present

## 2021-01-29 DIAGNOSIS — J439 Emphysema, unspecified: Secondary | ICD-10-CM | POA: Diagnosis not present

## 2021-01-29 DIAGNOSIS — Z87891 Personal history of nicotine dependence: Secondary | ICD-10-CM | POA: Diagnosis not present

## 2021-01-29 DIAGNOSIS — F418 Other specified anxiety disorders: Secondary | ICD-10-CM | POA: Diagnosis not present

## 2021-01-29 DIAGNOSIS — E785 Hyperlipidemia, unspecified: Secondary | ICD-10-CM | POA: Diagnosis not present

## 2021-01-29 DIAGNOSIS — I872 Venous insufficiency (chronic) (peripheral): Secondary | ICD-10-CM | POA: Diagnosis not present

## 2021-01-29 DIAGNOSIS — Z792 Long term (current) use of antibiotics: Secondary | ICD-10-CM | POA: Diagnosis not present

## 2021-01-29 DIAGNOSIS — G250 Essential tremor: Secondary | ICD-10-CM | POA: Diagnosis not present

## 2021-01-29 DIAGNOSIS — K219 Gastro-esophageal reflux disease without esophagitis: Secondary | ICD-10-CM | POA: Diagnosis not present

## 2021-01-29 DIAGNOSIS — M109 Gout, unspecified: Secondary | ICD-10-CM | POA: Diagnosis not present

## 2021-01-29 DIAGNOSIS — Z6841 Body Mass Index (BMI) 40.0 and over, adult: Secondary | ICD-10-CM | POA: Diagnosis not present

## 2021-02-02 ENCOUNTER — Telehealth: Payer: Self-pay

## 2021-02-02 ENCOUNTER — Telehealth: Payer: Self-pay | Admitting: Pharmacist

## 2021-02-02 NOTE — Telephone Encounter (Signed)
I have sent over order for portable oxygen to [facility]. Hospital discharge summary says she is on 3 liters at discharge.

## 2021-02-02 NOTE — Telephone Encounter (Signed)
Pt. Called stating that she is on oxygen since leaving the hospital the oxygen they gave her is to heavy for her to carry and use a walker at the same time to walk. She got it from Angie when she was recently in the hospital. She wants to know if you could order her a smaller more portable oxygen like the ones you can carry on your shoulder in a small bag from a different company. They told her they did not have a smaller one there at [facility]. She does know how she is supposed to get to her doctor's apt. On Thursday to see you with her current oxygen tank situation.

## 2021-02-02 NOTE — Telephone Encounter (Signed)
Home health with [facility] requesting orders for PT once a week for 4 weeks, CB # [phone]. Patient has upcoming appointment with [name] on 02/04/21 verbally discussed with [name] that orders are ok. Called Darnelle Blevins back and notified of orders

## 2021-02-02 NOTE — Chronic Care Management (AMB) (Signed)
Chronic Care Management Pharmacy Assistant   Name: Brandi Stephens  MRN: [id number] DOB: 26-Apr-1945  Called and spoke with patient to reschedule her initial appointment with Jeni Salles, Pharm D. Patient was reschedule to January. She has had a lot going on with her health and physical rehab lately.  Patient was agreeable to new appointment date and time. Patient thanked me for my call.   Medications: Outpatient Encounter Medications as of 02/02/2021  Medication Sig Note   albuterol (VENTOLIN HFA) 108 (90 Base) MCG/ACT inhaler INHALE 2 PUFFS BY MOUTH EVERY 4 TO 6 HOURS AS NEEDED (Patient taking differently: Inhale 2 puffs into the lungs every 4 (four) hours as needed for wheezing.)    allopurinol (ZYLOPRIM) 300 MG tablet 1 daily (Patient taking differently: Take 300 mg by mouth daily. 1 daily)    amitriptyline (ELAVIL) 75 MG tablet TAKE 1 TABLET(75 MG) BY MOUTH AT BEDTIME (Patient taking differently: Take 75 mg by mouth at bedtime.)    anastrozole (ARIMIDEX) 1 MG tablet TAKE 1 TABLET(1 MG) BY MOUTH DAILY (Patient taking differently: Take 1 mg by mouth daily.) 01/18/2021: On hold   atorvastatin (LIPITOR) 20 MG tablet TAKE 1 TABLET BY MOUTH EVERY DAY (Patient taking differently: Take 20 mg by mouth daily.)    betamethasone dipropionate 0.05 % cream Apply 1 application topically daily as needed (irritated skin).    Biotin 3 MG TABS Take 3 mg by mouth daily.    blood glucose meter kit and supplies KIT Dispense based on patient and insurance preference. Use up to four times daily as directed.    bumetanide (BUMEX) 1 MG tablet Take 1 tablet (1 mg total) by mouth daily.    cephALEXin (KEFLEX) 500 MG capsule TAKE 1 CAPSULE(500 MG) BY MOUTH THREE TIMES DAILY (Patient taking differently: Take 500 mg by mouth 3 (three) times daily.)    Cholecalciferol (D3 ADULT PO) Take 1 capsule by mouth daily.    diazepam (VALIUM) 5 MG tablet Take 1 tablet (5 mg total) by mouth daily as needed for anxiety.     diclofenac Sodium (VOLTAREN) 1 % GEL Apply 4 g topically 4 (four) times daily.    docusate sodium (COLACE) 100 MG capsule Take 1 capsule (100 mg total) by mouth 3 (three) times daily. (Patient taking differently: Take 100 mg by mouth 2 (two) times daily.)    DULoxetine (CYMBALTA) 60 MG capsule TAKE 1 CAPSULE(60 MG) BY MOUTH DAILY (Patient taking differently: Take 60 mg by mouth daily.)    Fluticasone-Salmeterol (ADVAIR) 250-50 MCG/DOSE AEPB Inhale 1 puff into the lungs every 12 (twelve) hours.    ipratropium-albuterol (DUONEB) 0.5-2.5 (3) MG/3ML SOLN Take 3 mLs by nebulization every 6 (six) hours as needed.    levothyroxine (SYNTHROID) 75 MCG tablet TAKE 1 TABLET(75 MCG) BY MOUTH DAILY BEFORE BREAKFAST (Patient taking differently: Take 75 mcg by mouth daily before breakfast.)    lidocaine (LIDODERM) 5 % UNWRAP AND APPLY 2 PATCHES TO SKIN DAILY, REMOVE AND DISCARD PATCH WITHIN 12 HOURS OR AS DIRECTED BY DOCTOR (Patient taking differently: Place 1 patch onto the skin daily.)    magnesium gluconate (MAGONATE) 500 MG tablet Take 2 tablets (1,000 mg total) by mouth at bedtime.    metoprolol succinate (TOPROL-XL) 50 MG 24 hr tablet Take 1 tablet (50 mg total) by mouth daily. Take with or immediately following a meal.    Omega-3 Fatty Acids (OMEGA-3 FISH OIL PO) Take 1 capsule by mouth daily.    oxyCODONE-acetaminophen (PERCOCET/ROXICET) 5-325 MG  tablet Take 1 tablet by mouth every 6 (six) hours as needed for moderate pain or severe pain.    pantoprazole (PROTONIX) 40 MG tablet Take 1 tablet (40 mg total) by mouth daily at 6 (six) AM.    polyethylene glycol powder (GLYCOLAX/MIRALAX) 17 GM/SCOOP powder Take 17 g by mouth daily as needed for moderate constipation.     primidone (MYSOLINE) 50 MG tablet TAKE 1 TABLET BY MOUTH TWICE DAILY (Patient taking differently: Take 50 mg by mouth 2 (two) times daily.)    Probiotic Product (PROBIOTIC DAILY PO) Take 1 tablet by mouth daily.    Propylene Glycol (SYSTANE  BALANCE OP) Place 1 drop into both eyes 4 (four) times daily as needed (dry eyes).    QUEtiapine (SEROQUEL) 25 MG tablet TAKE 1 TABLET(25 MG) BY MOUTH AT BEDTIME (Patient taking differently: Take 25 mg by mouth at bedtime.)    rOPINIRole (REQUIP) 0.5 MG tablet TAKE 1 AND 1/2 TABLETS(0.75 MG) BY MOUTH AT BEDTIME (Patient taking differently: Take 0.75 mg by mouth at bedtime.)    vitamin B-12 (CYANOCOBALAMIN) 1000 MCG tablet Take 1 tablet (1,000 mcg total) by mouth daily.    Vitamin D, Ergocalciferol, 50000 units CAPS Take 1 tablet by mouth once a week. (Patient taking differently: Take 50,000 Units by mouth once a week. Sundays)    No facility-administered encounter medications on file as of 02/02/2021.    Care Gaps:  Covid-19 vaccine booster 3 - overdue since 11/19/19 Flu vaccine - due Colonoscopy - due    Star Rating Drugs:  Atorvastatin 1m - [facility]/21 [facility] [phone]

## 2021-02-02 NOTE — TOC Progression Note (Signed)
Transition of Care [facility]) - Progression Note    Patient Details  Name: Brandi Stephens MRN: [id number] Date of Birth: Dec 09, 1945  [facility] CM/SW Contact  Zenon Mayo, RN Phone Number: 02/02/2021, 3:10 PM  Clinical Narrative:    This NCM was informed by the secretary on the unit that patient states she got her oxygen in the room before she left the hospital but no one has brought any other oxygen to her home.  This NCM contacted [place] with [facility] and informed him of this information, gave him patient's phone number also,  he states he will look into this, patient phone is [phone].     Expected Discharge Plan: [place] Barriers to Discharge: Barriers Resolved  Expected Discharge Plan and Services Expected Discharge Plan: [place]   Discharge Planning Services: CM Consult Post Acute Care Choice: [place] arrangements for the past 2 months: Single Family Home Expected Discharge Date: 01/26/21               DME Arranged: Oxygen DME Agency: [facility] Date DME Agency Contacted: 01/25/21 Time DME Agency Contacted: [phone] Representative spoke with at DME Agency: Freda Munro HH Arranged: PT, OT, RN [facility] Agency: [place] ([place]) Date [place]: 01/22/21   Representative spoke with at [place]: [place] ([place]) Interventions    Readmission Risk Interventions Readmission Risk Prevention Plan 08/02/2019  Transportation Screening Complete  PCP or Specialist appointment within 3-5 days of discharge (No Data)  [place] or [place] Complete  SW Recovery Care/Counseling Consult Complete  [place] Not Applicable  Some recent data might be hidden

## 2021-02-03 NOTE — Telephone Encounter (Signed)
A representative from [place] called stating that they did not receive any orders for Brandi Stephens. They received the hospital notes but no orders. So I re faxed them  and let them know that the orders were on the cover sheet.

## 2021-02-04 ENCOUNTER — Other Ambulatory Visit: Payer: Self-pay

## 2021-02-04 ENCOUNTER — Telehealth (INDEPENDENT_AMBULATORY_CARE_PROVIDER_SITE_OTHER): Payer: PPO | Admitting: Internal Medicine

## 2021-02-04 ENCOUNTER — Encounter: Payer: Self-pay | Admitting: Medical

## 2021-02-04 ENCOUNTER — Ambulatory Visit (INDEPENDENT_AMBULATORY_CARE_PROVIDER_SITE_OTHER): Payer: PPO | Admitting: Medical

## 2021-02-04 VITALS — BP 120/72 | HR 87 | Resp 16 | Wt 278.0 lb

## 2021-02-04 DIAGNOSIS — N1832 Chronic kidney disease, stage 3b: Secondary | ICD-10-CM | POA: Diagnosis not present

## 2021-02-04 DIAGNOSIS — J431 Panlobular emphysema: Secondary | ICD-10-CM | POA: Diagnosis not present

## 2021-02-04 DIAGNOSIS — N179 Acute kidney failure, unspecified: Secondary | ICD-10-CM | POA: Diagnosis not present

## 2021-02-04 DIAGNOSIS — Z720 Tobacco use: Secondary | ICD-10-CM

## 2021-02-04 DIAGNOSIS — N1831 Chronic kidney disease, stage 3a: Secondary | ICD-10-CM | POA: Diagnosis not present

## 2021-02-04 DIAGNOSIS — M4626 Osteomyelitis of vertebra, lumbar region: Secondary | ICD-10-CM

## 2021-02-04 DIAGNOSIS — J9601 Acute respiratory failure with hypoxia: Secondary | ICD-10-CM

## 2021-02-04 DIAGNOSIS — I5032 Chronic diastolic (congestive) heart failure: Secondary | ICD-10-CM

## 2021-02-04 NOTE — Telephone Encounter (Signed)
noted 

## 2021-02-04 NOTE — Progress Notes (Signed)
Subjective: Chief Complaint  Patient presents with   Hospitalization Follow-up    Follow-up from being in [facility]. Now was dx with CHF. If no oxygen on then her HR will increase over 100's. She is currently on 3 liters and o2 is staying 92-96.    Here for hospital follow-up.  Hospitalized October 3 through January 26, 2021  [name] is a 75 yo female with past medical history of diastolic CHF, COPD not on O2, CKD-3A, HTN, HLD, hypothyroidism, morbid obesity, breast cancer s/p left breast lumpectomy, radiation and hormonal therapy, anxiety, depression and RLS   She was sent to the hospital by our office on October 3 due to hypoxemia, tachycardia, edema and shortness of breath.  She was admitted with working diagnosis of possible PE that could not be excluded by CTA chest.  She was started on IV heparin.  VQ scan and LE venous Doppler were ordered.  The next day, VQ scan and LE venous US negative for VTE.  Heparin discontinued.  She was ambulated and desaturated to 79% on RA.  Started on IV Lasix with improvement in the breathing and pedal edema.  She was changed to Bumex due to creatinine elevation.  She was discharged on 3 L of oxygen.  She called after our office yesterday requesting a portable oxygen tank given how heavy the abdomen was.  This is still pending.  She does have oxygen with her today but it is the larger tank.  Occupational and physical therapy come out to the house starting tomorrow.  She is suppose to be having sleep study per hospital discharge instructions  She is supposed to be checking daily weights at home that she cannot find her current scale.  Discharge Diagnoses:  Principal Problem:   Acute hypoxemic respiratory failure (HCC) Active Problems:   Essential hypertension   COPD (chronic obstructive pulmonary disease) (HCC)   Prediabetes   CKD (chronic kidney disease), stage III   Inappropriate sinus tachycardia   Muscular deconditioning   Acute respiratory  failure with hypoxia (HCC)   Morbid obesity ([name])  Currently she notes that she is still swollen.  Still feels fatigued.  Feels about the same when she came home from the hospital.  No new symptoms.  She is comliant with Bumex.   Past Medical History:  Diagnosis Date   AIN (anal intraepithelial neoplasia) anal canal    Anemia    with pregnancy   Anxiety    Arthritis    knees, lower back, knees   Chronic constipation    Chronic diastolic CHF (congestive heart failure) ([place]) 07/05/2019   CKD (chronic kidney disease)    Coarse tremors    Essential   COPD (chronic obstructive pulmonary disease) ([place])    2017 chest CT, history of   Degenerative lumbar spinal stenosis    Depression    Depression 07/05/2019   Drug dependency ([place])    Elevated PTHrP level 05/09/2019   ETOH abuse 07/05/2019   GERD (gastroesophageal reflux disease)    pt denies   Gout    Grade I diastolic dysfunction 77/02/6578   Noted ECHO   Hemorrhoids    History of adenomatous polyp of colon    History of basal cell carcinoma (BCC) excision 2015   nose   History of sepsis 05/14/2014   post lumbar surgery   History of shingles    x2   Hyperlipidemia    Hypertension    Hypothyroidism    Irregular heart beat  history of while undergoing radiation   Malignant neoplasm of upper-inner quadrant of left breast in female, estrogen receptor positive [facility]) oncologist-  dr Jana Hakim--  per lov in epic no recurrence   dx 07-13-2015  Left breast invasive ductal carcinoma, Stage IA, Grade 1 (TXN0),  09-16-2015  s/p  left breast lumpectomy with sln bx's,  completed radiation 11-18-2015,  started antiestrogen therapy 12-14-2015   Neuropathy    Left foot and back of left leg   Obesity (BMI 30-39.9) 07/05/2019   Personal history of radiation therapy    completed 11-18-2015  left breast   Pneumonia    PONV (postoperative nausea and vomiting)    Prediabetes    diet controlled, no med   Restless leg syndrome    Seasonal  allergies    Seizure ([place]) 05/2015   due to sepsis, only one time episode   Tremor    Wears partial dentures    lower    Current Outpatient Medications on File Prior to Visit  Medication Sig Dispense Refill   albuterol (VENTOLIN HFA) 108 (90 Base) MCG/ACT inhaler INHALE 2 PUFFS BY MOUTH EVERY 4 TO 6 HOURS AS NEEDED (Patient taking differently: Inhale 2 puffs into the lungs every 4 (four) hours as needed for wheezing.) 42.5 g 0   allopurinol (ZYLOPRIM) 300 MG tablet 1 daily (Patient taking differently: Take 300 mg by mouth daily. 1 daily) 90 tablet 0   amitriptyline (ELAVIL) 75 MG tablet TAKE 1 TABLET(75 MG) BY MOUTH AT BEDTIME (Patient taking differently: Take 75 mg by mouth at bedtime.) 90 tablet 3   anastrozole (ARIMIDEX) 1 MG tablet TAKE 1 TABLET(1 MG) BY MOUTH DAILY (Patient taking differently: Take 1 mg by mouth daily.) 90 tablet 3   atorvastatin (LIPITOR) 20 MG tablet TAKE 1 TABLET BY MOUTH EVERY DAY (Patient taking differently: Take 20 mg by mouth daily.) 90 tablet 0   betamethasone dipropionate 0.05 % cream Apply 1 application topically daily as needed (irritated skin).     Biotin 3 MG TABS Take 3 mg by mouth daily.     blood glucose meter kit and supplies KIT Dispense based on patient and insurance preference. Use up to four times daily as directed. 1 each 0   bumetanide (BUMEX) 1 MG tablet Take 1 tablet (1 mg total) by mouth daily. 30 tablet 11   cephALEXin (KEFLEX) 500 MG capsule TAKE 1 CAPSULE(500 MG) BY MOUTH THREE TIMES DAILY (Patient taking differently: Take 500 mg by mouth 3 (three) times daily.) 90 capsule 3   Cholecalciferol (D3 ADULT PO) Take 1 capsule by mouth daily.     diazepam (VALIUM) 5 MG tablet Take 1 tablet (5 mg total) by mouth daily as needed for anxiety. 3 tablet 0   diclofenac Sodium (VOLTAREN) 1 % GEL Apply 4 g topically 4 (four) times daily. 350 g 1   docusate sodium (COLACE) 100 MG capsule Take 1 capsule (100 mg total) by mouth 3 (three) times daily. (Patient  taking differently: Take 100 mg by mouth 2 (two) times daily.) 10 capsule 0   DULoxetine (CYMBALTA) 60 MG capsule TAKE 1 CAPSULE(60 MG) BY MOUTH DAILY (Patient taking differently: Take 60 mg by mouth daily.) 90 capsule 1   Fluticasone-Salmeterol (ADVAIR) 250-50 MCG/DOSE AEPB Inhale 1 puff into the lungs every 12 (twelve) hours. 60 each 11   ipratropium-albuterol (DUONEB) 0.5-2.5 (3) MG/3ML SOLN Take 3 mLs by nebulization every 6 (six) hours as needed. 360 mL 3   levothyroxine (SYNTHROID) 75 MCG tablet TAKE  1 TABLET(75 MCG) BY MOUTH DAILY BEFORE BREAKFAST (Patient taking differently: Take 75 mcg by mouth daily before breakfast.) 90 tablet 1   lidocaine (LIDODERM) 5 % UNWRAP AND APPLY 2 PATCHES TO SKIN DAILY, REMOVE AND DISCARD PATCH WITHIN 12 HOURS OR AS DIRECTED BY DOCTOR (Patient taking differently: Place 1 patch onto the skin daily.) 180 patch 1   magnesium gluconate (MAGONATE) 500 MG tablet Take 2 tablets (1,000 mg total) by mouth at bedtime. 60 tablet 0   metoprolol succinate (TOPROL-XL) 50 MG 24 hr tablet Take 1 tablet (50 mg total) by mouth daily. Take with or immediately following a meal. 30 tablet 2   Omega-3 Fatty Acids (OMEGA-3 FISH OIL PO) Take 1 capsule by mouth daily.     oxyCODONE-acetaminophen (PERCOCET/ROXICET) 5-325 MG tablet Take 1 tablet by mouth every 6 (six) hours as needed for moderate pain or severe pain. 5 tablet 0   pantoprazole (PROTONIX) 40 MG tablet Take 1 tablet (40 mg total) by mouth daily at 6 (six) AM. 30 tablet 0   polyethylene glycol powder (GLYCOLAX/MIRALAX) 17 GM/SCOOP powder Take 17 g by mouth daily as needed for moderate constipation.      primidone (MYSOLINE) 50 MG tablet TAKE 1 TABLET BY MOUTH TWICE DAILY (Patient taking differently: Take 50 mg by mouth 2 (two) times daily.) 180 tablet 3   Probiotic Product (PROBIOTIC DAILY PO) Take 1 tablet by mouth daily.     Propylene Glycol (SYSTANE BALANCE OP) Place 1 drop into both eyes 4 (four) times daily as needed (dry  eyes).     QUEtiapine (SEROQUEL) 25 MG tablet TAKE 1 TABLET(25 MG) BY MOUTH AT BEDTIME (Patient taking differently: Take 25 mg by mouth at bedtime.) 90 tablet 1   rOPINIRole (REQUIP) 0.5 MG tablet TAKE 1 AND 1/2 TABLETS(0.75 MG) BY MOUTH AT BEDTIME (Patient taking differently: Take 0.75 mg by mouth at bedtime.) 140 tablet 0   vitamin B-12 (CYANOCOBALAMIN) 1000 MCG tablet Take 1 tablet (1,000 mcg total) by mouth daily. 30 tablet 0   Vitamin D, Ergocalciferol, 50000 units CAPS Take 1 tablet by mouth once a week. (Patient taking differently: Take 50,000 Units by mouth once a week. Sundays) 7 capsule 0   No current facility-administered medications on file prior to visit.    ROS as in subjective     Objective: BP 120/72   Pulse 87   Resp 16   SpO2 96%   Wt Readings from Last 3 Encounters:  01/25/21 277 lb 1.9 oz (125.7 kg)  01/18/21 282 lb 3.2 oz (128 kg)  01/06/21 287 lb (130.2 kg)   Gen: nad, seated in wheelchair with oxygen tank, nasal cannula, 3L/min Heart: rrr, normal s1, s2, no murmurs Lungs : Somewhat decreased sounds in general but no obvious crackles or wheezes He seems a little diaphoretic Extremities 1+ nonpitting edema upper and lower  Echo 01/20/2021  1. Limited echo for LV function   2. Left ventricular ejection fraction, by estimation, is 60 to 65%. The  left ventricle has normal function. There is mild left ventricular  hypertrophy. Left ventricular diastolic parameters are consistent with  Grade I diastolic dysfunction (impaired  relaxation).   3. Aortic dilatation noted. There is mild dilatation of the ascending  aorta, measuring 41 mm.   4. The inferior vena cava is dilated in size with >50% respiratory  variability, suggesting right atrial pressure of 8 mmHg.   Comparison(s): Changes from prior study are noted. 07/07/2020: LVEF 65-70%.     CT  chest angio PE 01/18/2021 IMPRESSION: 1. Ill-defined area of low attenuation involving a lower lobe branch of the  right pulmonary artery without definite evidence of intraluminal filling defects. A small amount of pulmonary embolism can not completely be excluded. Further evaluation with a nuclear medicine ventilation/perfusion scan is recommended. 2. Mild bibasilar atelectasis. 3. Aortic atherosclerosis.   Aortic Atherosclerosis (ICD10-I70.0).   NM Pulmonary Perfusion 01/19/21 IMPRESSION: 1. Low probability study for pulmonary thromboembolism. Areas of nonsegmental decreased perfusion predominate in upper lungs consistent with changes from emphysema and/or small airways disease.      Assessment: Encounter Diagnoses  Name Primary?   Acute respiratory failure with hypoxia (HCC) Yes   Acute renal failure superimposed on stage 3b chronic kidney disease, unspecified acute renal failure type ([place])    Stage 3a chronic kidney disease (HCC)    Panlobular emphysema (HCC)    Chronic diastolic CHF (congestive heart failure) ([place])    Morbid obesity ([place])    Tobacco use      Plan: I reviewed the hospital discharge notes, medications reconciled, reviewed imaging and labs done in the hospital.  Updated labs as below  She will continue on 3 L of oxygen for now until she sees pulmonology soon  Continue plan for outpatient sleep study  Follow-up as planned with pulmonology and cardiology.  We will look into preferably in lab sleep study  Continue daily weights, continue Bumex and rest of medications unchanged.  She is pending portable oxygen with home health and physical and Occupational Therapy comes out to the home starting tomorrow  She initially was trying to get established with the bariatric clinic for weight loss surgery.  Given the new cardiac issues we will have to wait till things are stabilized.  Not sure if she will be a candidate for this or not    Patient Instructions  Recommendations: Continue oxygen for now During part of the day if resting, you could take oxygen off as long as  your oximeter is reading (93% or better). I would recommend using oxygen through the night You should be getting call about referral to pulmonology /lung doctor You need to follow up as scheduled with cardiology We need to explore options for sleep study recommended by hospital Measure weights daily.  You were 278lb today.  If any weight gain of 3 lbs or more in a given day, then call us or cardiology Limit salt Keep water intake consistent We will call back about lab results Continue the same medication for now    [facility] was seen today for hospitalization follow-up.  Diagnoses and all orders for this visit:  Acute respiratory failure with hypoxia ([place]) -     Basic metabolic panel -     CBC  Acute renal failure superimposed on stage 3b chronic kidney disease, unspecified acute renal failure type (HCC) -     Basic metabolic panel -     CBC  Stage 3a chronic kidney disease (HCC) -     Basic metabolic panel -     CBC  Panlobular emphysema (HCC)  Chronic diastolic CHF (congestive heart failure) (HCC)  Morbid obesity (HCC)  Tobacco use  F/u pending lab, cardiology ,sleep study, pulmonology consult.

## 2021-02-04 NOTE — Progress Notes (Signed)
Virtual Visit via Telephone Note  I connected with Brandi Stephens on 02/04/21 at 11:00 AM EDT by telephone and verified that I am speaking with the correct person using two identifiers.  Location: Patient: Home Provider: [facility]   I discussed the limitations, risks, security and privacy concerns of performing an evaluation and management service by telephone and the availability of in person appointments. I also discussed with the patient that there may be a patient responsible charge related to this service. The patient expressed understanding and agreed to proceed.   History of Present Illness: I called and spoke with Brandi Stephens today.  She continues on chronic, suppressive oral cephalexin following postoperative MSSA lumbar infection in March 2021.  Fortunately she has had no further problems tolerating cephalexin.  She has not had any diarrhea.  She has multiple medical problems and has been very reluctant to stop cephalexin.  Her chronic back pain is unchanged.  She had another recent fall at home and knocked out her front teeth.  She had had $18,000 worth of dental work.  She was recently hospitalized with acute respiratory failure.  She is feeling better since discharge last week.  She has regained a lot of the weight that she lost while hospitalized.  She says that she weighs herself daily but she does not have a plan for what to do if she gains too much weight.  She is seeing her PCP later today.   Observations/Objective: Sed Rate (mm/h)  Date Value  11/03/2020 28  08/04/2020 28  06/10/2020 22   CRP (mg/L)  Date Value  11/03/2020 18.5 (H)  08/04/2020 18.5 (H)  06/10/2020 21.6 (H)  02/12/2019 <1     Assessment and Plan: She prefers to continue cephalexin for now.  I will have her follow-up in 2 months for repeat evaluation and blood work.  Follow Up Instructions: Continue cephalexin In person visit in 2 months   I discussed the assessment and treatment plan with the patient.  The patient was provided an opportunity to ask questions and all were answered. The patient agreed with the plan and demonstrated an understanding of the instructions.   The patient was advised to call back or seek an in-person evaluation if the symptoms worsen or if the condition fails to improve as anticipated.  I provided 16 minutes of non-face-to-face time during this encounter.   [name], MD

## 2021-02-04 NOTE — Patient Instructions (Addendum)
Recommendations: Continue oxygen for now During part of the day if resting, you could take oxygen off as long as your oximeter is reading (93% or better). I would recommend using oxygen through the night You should be getting call about referral to pulmonology /lung doctor You need to follow up as scheduled with cardiology We need to explore options for sleep study recommended by hospital Measure weights daily.  You were 278lb today.  If any weight gain of 3 lbs or more in a given day, then call us or cardiology Limit salt Keep water intake consistent We will call back about lab results Continue the same medication for now

## 2021-02-05 ENCOUNTER — Other Ambulatory Visit: Payer: Self-pay | Admitting: Internal Medicine

## 2021-02-05 DIAGNOSIS — Z6841 Body Mass Index (BMI) 40.0 and over, adult: Secondary | ICD-10-CM | POA: Diagnosis not present

## 2021-02-05 DIAGNOSIS — J439 Emphysema, unspecified: Secondary | ICD-10-CM | POA: Diagnosis not present

## 2021-02-05 DIAGNOSIS — R5383 Other fatigue: Secondary | ICD-10-CM

## 2021-02-05 DIAGNOSIS — E1122 Type 2 diabetes mellitus with diabetic chronic kidney disease: Secondary | ICD-10-CM | POA: Diagnosis not present

## 2021-02-05 DIAGNOSIS — F418 Other specified anxiety disorders: Secondary | ICD-10-CM | POA: Diagnosis not present

## 2021-02-05 DIAGNOSIS — K219 Gastro-esophageal reflux disease without esophagitis: Secondary | ICD-10-CM | POA: Diagnosis not present

## 2021-02-05 DIAGNOSIS — G2581 Restless legs syndrome: Secondary | ICD-10-CM | POA: Diagnosis not present

## 2021-02-05 DIAGNOSIS — E039 Hypothyroidism, unspecified: Secondary | ICD-10-CM | POA: Diagnosis not present

## 2021-02-05 DIAGNOSIS — G250 Essential tremor: Secondary | ICD-10-CM | POA: Diagnosis not present

## 2021-02-05 DIAGNOSIS — E1142 Type 2 diabetes mellitus with diabetic polyneuropathy: Secondary | ICD-10-CM | POA: Diagnosis not present

## 2021-02-05 DIAGNOSIS — Z792 Long term (current) use of antibiotics: Secondary | ICD-10-CM | POA: Diagnosis not present

## 2021-02-05 DIAGNOSIS — M47816 Spondylosis without myelopathy or radiculopathy, lumbar region: Secondary | ICD-10-CM | POA: Diagnosis not present

## 2021-02-05 DIAGNOSIS — N1831 Chronic kidney disease, stage 3a: Secondary | ICD-10-CM | POA: Diagnosis not present

## 2021-02-05 DIAGNOSIS — M48061 Spinal stenosis, lumbar region without neurogenic claudication: Secondary | ICD-10-CM | POA: Diagnosis not present

## 2021-02-05 DIAGNOSIS — K6282 Dysplasia of anus: Secondary | ICD-10-CM | POA: Diagnosis not present

## 2021-02-05 DIAGNOSIS — J9601 Acute respiratory failure with hypoxia: Secondary | ICD-10-CM | POA: Diagnosis not present

## 2021-02-05 DIAGNOSIS — I5032 Chronic diastolic (congestive) heart failure: Secondary | ICD-10-CM | POA: Diagnosis not present

## 2021-02-05 DIAGNOSIS — M170 Bilateral primary osteoarthritis of knee: Secondary | ICD-10-CM | POA: Diagnosis not present

## 2021-02-05 DIAGNOSIS — G8929 Other chronic pain: Secondary | ICD-10-CM | POA: Diagnosis not present

## 2021-02-05 DIAGNOSIS — E785 Hyperlipidemia, unspecified: Secondary | ICD-10-CM | POA: Diagnosis not present

## 2021-02-05 DIAGNOSIS — I130 Hypertensive heart and chronic kidney disease with heart failure and stage 1 through stage 4 chronic kidney disease, or unspecified chronic kidney disease: Secondary | ICD-10-CM | POA: Diagnosis not present

## 2021-02-05 DIAGNOSIS — E1151 Type 2 diabetes mellitus with diabetic peripheral angiopathy without gangrene: Secondary | ICD-10-CM | POA: Diagnosis not present

## 2021-02-05 DIAGNOSIS — N1832 Chronic kidney disease, stage 3b: Secondary | ICD-10-CM | POA: Diagnosis not present

## 2021-02-05 DIAGNOSIS — R0683 Snoring: Secondary | ICD-10-CM

## 2021-02-05 DIAGNOSIS — R0902 Hypoxemia: Secondary | ICD-10-CM

## 2021-02-05 DIAGNOSIS — I872 Venous insufficiency (chronic) (peripheral): Secondary | ICD-10-CM | POA: Diagnosis not present

## 2021-02-05 DIAGNOSIS — K5909 Other constipation: Secondary | ICD-10-CM | POA: Diagnosis not present

## 2021-02-05 DIAGNOSIS — N179 Acute kidney failure, unspecified: Secondary | ICD-10-CM | POA: Diagnosis not present

## 2021-02-05 DIAGNOSIS — M109 Gout, unspecified: Secondary | ICD-10-CM | POA: Diagnosis not present

## 2021-02-05 NOTE — Progress Notes (Signed)
I have put order in for split night with titration to [place]

## 2021-02-06 ENCOUNTER — Other Ambulatory Visit: Payer: Self-pay | Admitting: Adult Health

## 2021-02-06 LAB — CBC
Hematocrit: 40 % (ref 34.0–46.6)
Hemoglobin: 13.3 g/dL (ref 11.1–15.9)
MCH: 31.7 pg (ref 26.6–33.0)
MCHC: 33.3 g/dL (ref 31.5–35.7)
MCV: 95 fL (ref 79–97)
Platelets: 250 10*3/uL (ref 150–450)
RBC: 4.2 x10E6/uL (ref 3.77–5.28)
RDW: 13.8 % (ref 11.7–15.4)
WBC: 11.6 10*3/uL — ABNORMAL HIGH (ref 3.4–10.8)

## 2021-02-06 LAB — BASIC METABOLIC PANEL
BUN/Creatinine Ratio: 28 (ref 12–28)
BUN: 27 mg/dL (ref 8–27)
CO2: 20 mmol/L (ref 20–29)
Calcium: 10.2 mg/dL (ref 8.7–10.3)
Chloride: 102 mmol/L (ref 96–106)
Creatinine, Ser: 0.95 mg/dL (ref 0.57–1.00)
Glucose: 149 mg/dL — ABNORMAL HIGH (ref 70–99)
Potassium: 5.1 mmol/L (ref 3.5–5.2)
Sodium: 140 mmol/L (ref 134–144)
eGFR: 63 mL/min/{1.73_m2} (ref >59)

## 2021-02-08 ENCOUNTER — Other Ambulatory Visit: Payer: Self-pay | Admitting: Internal Medicine

## 2021-02-09 ENCOUNTER — Ambulatory Visit: Payer: PPO | Admitting: Physical Medicine and Rehabilitation

## 2021-02-09 NOTE — Telephone Encounter (Signed)
No entry 

## 2021-02-12 ENCOUNTER — Telehealth: Payer: Self-pay | Admitting: Medical

## 2021-02-12 DIAGNOSIS — N1831 Chronic kidney disease, stage 3a: Secondary | ICD-10-CM | POA: Diagnosis not present

## 2021-02-12 DIAGNOSIS — G8929 Other chronic pain: Secondary | ICD-10-CM | POA: Diagnosis not present

## 2021-02-12 DIAGNOSIS — K219 Gastro-esophageal reflux disease without esophagitis: Secondary | ICD-10-CM | POA: Diagnosis not present

## 2021-02-12 DIAGNOSIS — I872 Venous insufficiency (chronic) (peripheral): Secondary | ICD-10-CM | POA: Diagnosis not present

## 2021-02-12 DIAGNOSIS — G250 Essential tremor: Secondary | ICD-10-CM | POA: Diagnosis not present

## 2021-02-12 DIAGNOSIS — E1151 Type 2 diabetes mellitus with diabetic peripheral angiopathy without gangrene: Secondary | ICD-10-CM | POA: Diagnosis not present

## 2021-02-12 DIAGNOSIS — K5909 Other constipation: Secondary | ICD-10-CM | POA: Diagnosis not present

## 2021-02-12 DIAGNOSIS — Z792 Long term (current) use of antibiotics: Secondary | ICD-10-CM | POA: Diagnosis not present

## 2021-02-12 DIAGNOSIS — F418 Other specified anxiety disorders: Secondary | ICD-10-CM | POA: Diagnosis not present

## 2021-02-12 DIAGNOSIS — M170 Bilateral primary osteoarthritis of knee: Secondary | ICD-10-CM | POA: Diagnosis not present

## 2021-02-12 DIAGNOSIS — E1142 Type 2 diabetes mellitus with diabetic polyneuropathy: Secondary | ICD-10-CM | POA: Diagnosis not present

## 2021-02-12 DIAGNOSIS — M109 Gout, unspecified: Secondary | ICD-10-CM | POA: Diagnosis not present

## 2021-02-12 DIAGNOSIS — I130 Hypertensive heart and chronic kidney disease with heart failure and stage 1 through stage 4 chronic kidney disease, or unspecified chronic kidney disease: Secondary | ICD-10-CM | POA: Diagnosis not present

## 2021-02-12 DIAGNOSIS — J439 Emphysema, unspecified: Secondary | ICD-10-CM | POA: Diagnosis not present

## 2021-02-12 DIAGNOSIS — Z6841 Body Mass Index (BMI) 40.0 and over, adult: Secondary | ICD-10-CM | POA: Diagnosis not present

## 2021-02-12 DIAGNOSIS — E785 Hyperlipidemia, unspecified: Secondary | ICD-10-CM | POA: Diagnosis not present

## 2021-02-12 DIAGNOSIS — I5032 Chronic diastolic (congestive) heart failure: Secondary | ICD-10-CM | POA: Diagnosis not present

## 2021-02-12 DIAGNOSIS — E039 Hypothyroidism, unspecified: Secondary | ICD-10-CM | POA: Diagnosis not present

## 2021-02-12 DIAGNOSIS — G2581 Restless legs syndrome: Secondary | ICD-10-CM | POA: Diagnosis not present

## 2021-02-12 DIAGNOSIS — E1122 Type 2 diabetes mellitus with diabetic chronic kidney disease: Secondary | ICD-10-CM | POA: Diagnosis not present

## 2021-02-12 DIAGNOSIS — K6282 Dysplasia of anus: Secondary | ICD-10-CM | POA: Diagnosis not present

## 2021-02-12 DIAGNOSIS — J9601 Acute respiratory failure with hypoxia: Secondary | ICD-10-CM | POA: Diagnosis not present

## 2021-02-12 DIAGNOSIS — M48061 Spinal stenosis, lumbar region without neurogenic claudication: Secondary | ICD-10-CM | POA: Diagnosis not present

## 2021-02-12 DIAGNOSIS — M47816 Spondylosis without myelopathy or radiculopathy, lumbar region: Secondary | ICD-10-CM | POA: Diagnosis not present

## 2021-02-12 NOTE — Telephone Encounter (Signed)
Pt was advised Brandi Stephens 

## 2021-02-12 NOTE — Telephone Encounter (Signed)
The [facility] with [facility] called and said when she did her visit with pt she had complained of diarrhea and vomiting, the diarrhea had subsided but she was still very nauseous and wanted to se if she could get something called in to [facility] on [address]

## 2021-02-16 ENCOUNTER — Telehealth: Payer: Self-pay | Admitting: Medical

## 2021-02-16 DIAGNOSIS — J439 Emphysema, unspecified: Secondary | ICD-10-CM | POA: Diagnosis not present

## 2021-02-16 DIAGNOSIS — G2581 Restless legs syndrome: Secondary | ICD-10-CM | POA: Diagnosis not present

## 2021-02-16 DIAGNOSIS — E1122 Type 2 diabetes mellitus with diabetic chronic kidney disease: Secondary | ICD-10-CM | POA: Diagnosis not present

## 2021-02-16 DIAGNOSIS — M170 Bilateral primary osteoarthritis of knee: Secondary | ICD-10-CM | POA: Diagnosis not present

## 2021-02-16 DIAGNOSIS — J9601 Acute respiratory failure with hypoxia: Secondary | ICD-10-CM | POA: Diagnosis not present

## 2021-02-16 DIAGNOSIS — K5909 Other constipation: Secondary | ICD-10-CM | POA: Diagnosis not present

## 2021-02-16 DIAGNOSIS — E039 Hypothyroidism, unspecified: Secondary | ICD-10-CM | POA: Diagnosis not present

## 2021-02-16 DIAGNOSIS — I872 Venous insufficiency (chronic) (peripheral): Secondary | ICD-10-CM | POA: Diagnosis not present

## 2021-02-16 DIAGNOSIS — E1142 Type 2 diabetes mellitus with diabetic polyneuropathy: Secondary | ICD-10-CM | POA: Diagnosis not present

## 2021-02-16 DIAGNOSIS — Z792 Long term (current) use of antibiotics: Secondary | ICD-10-CM | POA: Diagnosis not present

## 2021-02-16 DIAGNOSIS — E785 Hyperlipidemia, unspecified: Secondary | ICD-10-CM | POA: Diagnosis not present

## 2021-02-16 DIAGNOSIS — I5032 Chronic diastolic (congestive) heart failure: Secondary | ICD-10-CM | POA: Diagnosis not present

## 2021-02-16 DIAGNOSIS — M48061 Spinal stenosis, lumbar region without neurogenic claudication: Secondary | ICD-10-CM | POA: Diagnosis not present

## 2021-02-16 DIAGNOSIS — K6282 Dysplasia of anus: Secondary | ICD-10-CM | POA: Diagnosis not present

## 2021-02-16 DIAGNOSIS — I130 Hypertensive heart and chronic kidney disease with heart failure and stage 1 through stage 4 chronic kidney disease, or unspecified chronic kidney disease: Secondary | ICD-10-CM | POA: Diagnosis not present

## 2021-02-16 DIAGNOSIS — M47816 Spondylosis without myelopathy or radiculopathy, lumbar region: Secondary | ICD-10-CM | POA: Diagnosis not present

## 2021-02-16 DIAGNOSIS — K219 Gastro-esophageal reflux disease without esophagitis: Secondary | ICD-10-CM | POA: Diagnosis not present

## 2021-02-16 DIAGNOSIS — E1151 Type 2 diabetes mellitus with diabetic peripheral angiopathy without gangrene: Secondary | ICD-10-CM | POA: Diagnosis not present

## 2021-02-16 DIAGNOSIS — M109 Gout, unspecified: Secondary | ICD-10-CM | POA: Diagnosis not present

## 2021-02-16 DIAGNOSIS — F418 Other specified anxiety disorders: Secondary | ICD-10-CM | POA: Diagnosis not present

## 2021-02-16 DIAGNOSIS — N1831 Chronic kidney disease, stage 3a: Secondary | ICD-10-CM | POA: Diagnosis not present

## 2021-02-16 DIAGNOSIS — Z6841 Body Mass Index (BMI) 40.0 and over, adult: Secondary | ICD-10-CM | POA: Diagnosis not present

## 2021-02-16 DIAGNOSIS — G250 Essential tremor: Secondary | ICD-10-CM | POA: Diagnosis not present

## 2021-02-16 DIAGNOSIS — G8929 Other chronic pain: Secondary | ICD-10-CM | POA: Diagnosis not present

## 2021-02-16 NOTE — Telephone Encounter (Signed)
I received a home health certification.  Please stamp MD signature and date all the pages, then fax back.  Talked to [facility] about charges for home health certification reviewed    Call patient about the following: I see a few issues with her medications after reviewing more documents.  She is on 2 different medications for restless leg.  I refilled primidone at some point but it looks like another doctor prescribes Requip.  She really should not be on both medications.  I recommend stopping 1 of these  Also the home health serve [place] says she was on Vistaril 25 mg daily as needed.  I do not even see that listed in her chart record.  Since she is on so many medications, if she does not need this I would recommend we discontinue that  I do recommend a med check visit in the next month or so but have her bring in all medications to review.  This will be a chronic disease chronic medication follow-up.

## 2021-02-16 NOTE — Telephone Encounter (Signed)
Did she get the portable oxygen?   Since she has been seen by home health, do they feel she needs to be in a short term rehab facility to help take care of her?  Do we have a recent home health report/status update?

## 2021-02-16 NOTE — Telephone Encounter (Signed)
Pt was called and states that her primidone is for her tremors and then requip was for her restless leg. Primidone did not work for restless leg. She states right now she can not get around good with her oxygen and wheelchair. She did fall this week and the home health nurse was to clal to report. She had a rx of vistaril from 2020. She would only take it once in a blue moon. She said a [facility] nurse came in within the last month and went over all her medications and took her off lasix, tramadol, ativan. She will also add vistaril to her list.

## 2021-02-17 ENCOUNTER — Telehealth: Payer: Self-pay | Admitting: Internal Medicine

## 2021-02-17 ENCOUNTER — Ambulatory Visit: Payer: PPO | Admitting: Medical

## 2021-02-17 NOTE — Telephone Encounter (Signed)
Brandi Stephens was notified of orders

## 2021-02-17 NOTE — Telephone Encounter (Signed)
Pt is using portable oxygen finally. Her BP 104/68 and she has been feeling weak but she does not need to be in a rehab facility. I have contacted [facility] to try to get reports of what was done in home. I will call back with evelyn is available [phone] ext [phone]

## 2021-02-17 NOTE — Telephone Encounter (Signed)
Shanti with [facility] called and needs verbal orders for PT 2x for 2 weeks,  1x for 3 weeks. [phone]

## 2021-02-17 NOTE — Progress Notes (Incomplete)
Cardiology Office Note   Date:  02/17/2021   ID:  Lakeyshia, Tuckerman 28-Dec-1945, MRN [id number]  PCP:  Carlena Hurl, PA-C  Cardiologist:  [name], MD EP: None  No chief complaint on file.     History of Present Illness: Brandi Stephens is a 75 y.o. female with PMH of chronic diastolic CHF, hypertension, hyperlipidemia, hypothyroidism, GERD, breast cancer s/p lumpectomy and XRT in 2017, and CKD stage III 8 who presents for hospital follow-up.  Since her last visit she was admitted to the hospital 01/18/2021 - 01/26/2021 for hypoxemia and tachycardia.  VQ scan and lower extremity Dopplers were negative for PE/DVT.  She was seen by cardiology that admission for possible CHF component though was largely was felt to be due to her COPD.  Limited echocardiogram at that time showed EF 60-65%, mild LVH, G1 DD, no significant valvular dysfunction, and mild dilation of the ascending aorta (41 mm).  She was transitioned from Lasix to Bumex for volume management.  She had persistent ambulatory hypoxia and was ultimately discharged home on 3 L O2 via nasal cannula.  To this hospitalization she was seen outpatient by myself 12/23/2020 for ongoing lower extremity edema.  She expressed frustration with her weight gain over the past several years and was considering meeting with a surgeon for possible gastric bypass.  In anticipation of possible surgery she was recommended to undergo a NST for preop evaluation given relative deconditioning/immobility.  NST showed no evidence of ischemia.  1. LE edema, predominately left leg: she numerous venous studies to r/o DVT which have been negative. Venous reflux study 09/2020 was negative. Seen by Dr. Oneida Alar who felt is likely 2/2 venous hypertension obesity and encouraged compression stockings.  - Encouraged zipper on compression stockings which she can obtain online - Continue Lasix - Continue to encourage low-salt diet   2. Chronic diastolic CHF:  Stable.  She notes improvement in swelling with Bumex.  Limited echo during recent hospital stay with EF 60 to 65%. - Continue Bumex   3. HTN: BP***today - Continue metoprolol succinate and lasix   4. HLD: LDL 95 01/2020 - Continue atorvastatin and omega-3  Past Medical History:  Diagnosis Date   AIN (anal intraepithelial neoplasia) anal canal    Anemia    with pregnancy   Anxiety    Arthritis    knees, lower back, knees   Chronic constipation    Chronic diastolic CHF (congestive heart failure) ([place]) 07/05/2019   CKD (chronic kidney disease)    Coarse tremors    Essential   COPD (chronic obstructive pulmonary disease) ([place])    2017 chest CT, history of   Degenerative lumbar spinal stenosis    Depression    Depression 07/05/2019   Drug dependency ([place])    Elevated PTHrP level 05/09/2019   ETOH abuse 07/05/2019   GERD (gastroesophageal reflux disease)    pt denies   Gout    Grade I diastolic dysfunction 36/62/9476   Noted ECHO   Hemorrhoids    History of adenomatous polyp of colon    History of basal cell carcinoma (BCC) excision 2015   nose   History of sepsis 05/14/2014   post lumbar surgery   History of shingles    x2   Hyperlipidemia    Hypertension    Hypothyroidism    Irregular heart beat    history of while undergoing radiation   Malignant neoplasm of upper-inner quadrant of left breast in female,  estrogen receptor positive [facility]) oncologist-  dr Jana Hakim--  per lov in epic no recurrence   dx 07-13-2015  Left breast invasive ductal carcinoma, Stage IA, Grade 1 (TXN0),  09-16-2015  s/p  left breast lumpectomy with sln bx's,  completed radiation 11-18-2015,  started antiestrogen therapy 12-14-2015   Neuropathy    Left foot and back of left leg   Obesity (BMI 30-39.9) 07/05/2019   Personal history of radiation therapy    completed 11-18-2015  left breast   Pneumonia    PONV (postoperative nausea and vomiting)    Prediabetes    diet controlled, no med   Restless  leg syndrome    Seasonal allergies    Seizure ([place]) 05/2015   due to sepsis, only one time episode   Tremor    Wears partial dentures    lower     Past Surgical History:  Procedure Laterality Date   ABDOMINAL HYSTERECTOMY  1985   BASAL CELL CARCINOMA EXCISION  10/15   nose   BREAST BIOPSY Right 08/24/2015   BREAST BIOPSY Right 07/13/2015   BREAST BIOPSY Left 07/13/2015   BREAST EXCISIONAL BIOPSY Left 1972   BREAST LUMPECTOMY Left    BREAST LUMPECTOMY WITH RADIOACTIVE SEED AND SENTINEL LYMPH NODE BIOPSY Left 09/16/2015   Procedure: BREAST LUMPECTOMY WITH RADIOACTIVE SEED AND SENTINEL LYMPH NODE BIOPSY;  Surgeon: Stark Klein, MD;  Location: [facility];  Service: General;  Laterality: Left;   BREAST REDUCTION SURGERY Bilateral 1998   CATARACT EXTRACTION W/ INTRAOCULAR LENS IMPLANT Left 2016   [place]   hallo   COLONOSCOPY  01/2013   polyps   EYE SURGERY Left 2016   HAND SURGERY Left 02-19-2002   dr Fredna Dow  _0    repair collateral ligament/ MPJ left thumb   HARDWARE REMOVAL N/A 07/24/2019   Procedure: REVISION OF HARDWARE Lumbar two - Sacral one. Extention of Fusion to Lumbar one;  Surgeon: [name], MD;  Location: [facility];  Service: Neurosurgery;  Laterality: N/A;   HEMORRHOID SURGERY  2008   HIGH RESOLUTION ANOSCOPY N/A 02/28/2018   Procedure: HIGH RESOLUTION ANOSCOPY WITH BIOPSY;  Surgeon: Leighton Ruff, MD;  Location: [facility];  Service: General;  Laterality: N/A;   KNEE ARTHROSCOPY Right 2002   LUMBAR SPINE SURGERY  03-20-2015   dr Kathyrn Sheriff   fusion L4-5, L5-S1   MOUTH SURGERY  07/14/2018   2 infected teeth removed   MULTIPLE TOOTH EXTRACTIONS     with bone grafting lower bottom right and left   REDUCTION MAMMAPLASTY Bilateral    RIGHT COLECTOMY  09-10-2003   dr Margot Chimes _1    multiple colon polyps (per path tubular adenoma's, hyperplastic , benign appendix, benign two lymph nodes   ROTATOR CUFF REPAIR Left  04/2016   TONSILLECTOMY  1969   TUBAL LIGATION Bilateral 1982     Current Outpatient Medications  Medication Sig Dispense Refill   albuterol (VENTOLIN HFA) 108 (90 Base) MCG/ACT inhaler INHALE 2 PUFFS BY MOUTH EVERY 4 TO 6 HOURS AS NEEDED (Patient taking differently: Inhale 2 puffs into the lungs every 4 (four) hours as needed for wheezing.) 42.5 g 0   allopurinol (ZYLOPRIM) 300 MG tablet 1 daily (Patient taking differently: Take 300 mg by mouth daily. 1 daily) 90 tablet 0   amitriptyline (ELAVIL) 75 MG tablet TAKE 1 TABLET(75 MG) BY MOUTH AT BEDTIME (Patient taking differently: Take 75 mg by mouth at bedtime.) 90 tablet 3   anastrozole (ARIMIDEX) 1 MG  tablet TAKE 1 TABLET(1 MG) BY MOUTH DAILY 90 tablet 0   atorvastatin (LIPITOR) 20 MG tablet TAKE 1 TABLET BY MOUTH EVERY DAY (Patient taking differently: Take 20 mg by mouth daily.) 90 tablet 0   betamethasone dipropionate 0.05 % cream Apply 1 application topically daily as needed (irritated skin).     Biotin 3 MG TABS Take 3 mg by mouth daily.     blood glucose meter kit and supplies KIT Dispense based on patient and insurance preference. Use up to four times daily as directed. 1 each 0   bumetanide (BUMEX) 1 MG tablet Take 1 tablet (1 mg total) by mouth daily. 30 tablet 11   cephALEXin (KEFLEX) 500 MG capsule TAKE 1 CAPSULE(500 MG) BY MOUTH THREE TIMES DAILY (Patient taking differently: Take 500 mg by mouth 3 (three) times daily.) 90 capsule 3   Cholecalciferol (D3 ADULT PO) Take 1 capsule by mouth daily.     diazepam (VALIUM) 5 MG tablet Take 1 tablet (5 mg total) by mouth daily as needed for anxiety. 3 tablet 0   diclofenac Sodium (VOLTAREN) 1 % GEL Apply 4 g topically 4 (four) times daily. 350 g 1   docusate sodium (COLACE) 100 MG capsule Take 1 capsule (100 mg total) by mouth 3 (three) times daily. (Patient taking differently: Take 100 mg by mouth 2 (two) times daily.) 10 capsule 0   DULoxetine (CYMBALTA) 60 MG capsule TAKE 1 CAPSULE(60 MG)  BY MOUTH DAILY (Patient taking differently: Take 60 mg by mouth daily.) 90 capsule 1   Fluticasone-Salmeterol (ADVAIR) 250-50 MCG/DOSE AEPB Inhale 1 puff into the lungs every 12 (twelve) hours. 60 each 11   ipratropium-albuterol (DUONEB) 0.5-2.5 (3) MG/3ML SOLN Take 3 mLs by nebulization every 6 (six) hours as needed. 360 mL 3   levothyroxine (SYNTHROID) 75 MCG tablet TAKE 1 TABLET(75 MCG) BY MOUTH DAILY BEFORE BREAKFAST (Patient taking differently: Take 75 mcg by mouth daily before breakfast.) 90 tablet 1   lidocaine (LIDODERM) 5 % UNWRAP AND APPLY 2 PATCHES TO SKIN DAILY, REMOVE AND DISCARD PATCH WITHIN 12 HOURS OR AS DIRECTED BY DOCTOR (Patient taking differently: Place 1 patch onto the skin daily.) 180 patch 1   magnesium gluconate (MAGONATE) 500 MG tablet Take 2 tablets (1,000 mg total) by mouth at bedtime. 60 tablet 0   metoprolol succinate (TOPROL-XL) 50 MG 24 hr tablet Take 1 tablet (50 mg total) by mouth daily. Take with or immediately following a meal. 30 tablet 2   Omega-3 Fatty Acids (OMEGA-3 FISH OIL PO) Take 1 capsule by mouth daily.     oxyCODONE-acetaminophen (PERCOCET/ROXICET) 5-325 MG tablet Take 1 tablet by mouth every 6 (six) hours as needed for moderate pain or severe pain. 5 tablet 0   pantoprazole (PROTONIX) 40 MG tablet Take 1 tablet (40 mg total) by mouth daily at 6 (six) AM. 30 tablet 0   polyethylene glycol powder (GLYCOLAX/MIRALAX) 17 GM/SCOOP powder Take 17 g by mouth daily as needed for moderate constipation.      primidone (MYSOLINE) 50 MG tablet TAKE 1 TABLET BY MOUTH TWICE DAILY (Patient taking differently: Take 50 mg by mouth 2 (two) times daily.) 180 tablet 3   Probiotic Product (PROBIOTIC DAILY PO) Take 1 tablet by mouth daily.     Propylene Glycol (SYSTANE BALANCE OP) Place 1 drop into both eyes 4 (four) times daily as needed (dry eyes).     QUEtiapine (SEROQUEL) 25 MG tablet TAKE 1 TABLET(25 MG) BY MOUTH AT BEDTIME (Patient taking differently:  Take 25 mg by mouth  at bedtime.) 90 tablet 1   rOPINIRole (REQUIP) 0.5 MG tablet TAKE 1 AND 1/2 TABLETS(0.75 MG) BY MOUTH AT BEDTIME (Patient taking differently: Take 0.75 mg by mouth at bedtime.) 140 tablet 0   vitamin B-12 (CYANOCOBALAMIN) 1000 MCG tablet Take 1 tablet (1,000 mcg total) by mouth daily. 30 tablet 0   No current facility-administered medications for this visit.    Allergies:   Ampicillin, Gabapentin, Penicillins, Codeine, and Other    Social History:  The patient  reports that she quit smoking about 2 years ago. Her smoking use included cigarettes. She has a 17.50 pack-year smoking history. She has never used smokeless tobacco. She reports current alcohol use. She reports that she does not currently use drugs.   Family History:  The patient's ***family history includes Atrial fibrillation in her mother; Brain cancer in her maternal aunt; Breast cancer in her maternal aunt; Lung cancer in her maternal aunt and maternal uncle; Prostate cancer in her maternal grandfather; Stomach cancer in her maternal grandmother; Stroke in her paternal grandmother; Tuberculosis in her paternal grandfather; Ulcers in her father.    ROS:  Please see the history of present illness.   Otherwise, review of systems are positive for {NONE DEFAULTED:18576}.   All other systems are reviewed and negative.    PHYSICAL EXAM: VS:  There were no vitals taken for this visit. , BMI There is no height or weight on file to calculate BMI. GEN: Well nourished, well developed, in no acute distress HEENT: normal Neck: no JVD, carotid bruits, or masses Cardiac: ***RRR; no murmurs, rubs, or gallops,no edema  Respiratory:  clear to auscultation bilaterally, normal work of breathing GI: soft, nontender, nondistended, + BS MS: no deformity or atrophy Skin: warm and dry, no rash Neuro:  Strength and sensation are intact Psych: euthymic mood, full affect   EKG:  EKG {ACTION; IS/IS UJW:[id number]} ordered today. The ekg ordered today  demonstrates ***   Recent Labs: 06/15/2020: ALT 22 01/18/2021: B Natriuretic Peptide 46.5 01/19/2021: Magnesium 2.4; TSH 4.495 02/05/2021: BUN 27; Creatinine, Ser 0.95; Hemoglobin 13.3; Platelets 250; Potassium 5.1; Sodium 140    Lipid Panel    Component Value Date/Time   CHOL 208 (H) 01/31/2020 1040   TRIG 362 (H) 01/31/2020 1040   HDL 53 01/31/2020 1040   CHOLHDL 3.9 01/31/2020 1040   CHOLHDL 4.2 07/08/2016 1042   VLDL 52 (H) 07/08/2016 1042   [place] 95 01/31/2020 1040      Wt Readings from Last 3 Encounters:  02/04/21 278 lb (126.1 kg)  01/25/21 277 lb 1.9 oz (125.7 kg)  01/18/21 282 lb 3.2 oz (128 kg)      Other studies Reviewed: Additional studies/ records that were reviewed today include:   Limited echocardiogram 01/20/21: 1. Limited echo for LV function   2. Left ventricular ejection fraction, by estimation, is 60 to 65%. The  left ventricle has normal function. There is mild left ventricular  hypertrophy. Left ventricular diastolic parameters are consistent with  Grade I diastolic dysfunction (impaired  relaxation).   3. Aortic dilatation noted. There is mild dilatation of the ascending  aorta, measuring 41 mm.   4. The inferior vena cava is dilated in size with >50% respiratory  variability, suggesting right atrial pressure of 8 mmHg.   Comparison(s): Changes from prior study are noted. 07/07/2020: LVEF 65-70%.     ASSESSMENT AND PLAN:  1.  ***   Current medicines are reviewed at length with the  patient today.  The patient {ACTIONS; HAS/DOES NOT HAVE:19233} concerns regarding medicines.  The following changes have been made:  {PLAN; NO CHANGE:13088:s}  Labs/ tests ordered today include: *** No orders of the defined types were placed in this encounter.    Disposition:   FU with *** in {gen number 7-03:[id number]} {Days to years:10300}  Signed, Abigail Butts, PA-C  02/17/2021 6:14 AM

## 2021-02-18 ENCOUNTER — Telehealth: Payer: Self-pay | Admitting: Internal Medicine

## 2021-02-18 NOTE — Telephone Encounter (Signed)
New Message:    Brandi Stephens from [facility]  called said patient had a significant weight gain.   Pt c/o swelling: STAT is pt has developed SOB within 24 hours  If swelling, where is the swelling located? Feet are swollen more than they usually are  How much weight have you gained and in what time span? 277.8 to 281.2- gained 4 lbs in one day  Have you gained 3 pounds in a day or 5 pounds in a week? 4 lbs in a day  Do you have a log of your daily weights (if so, list)?   Are you currently taking a fluid pill? yes  Are you currently SOB? no Have you traveled recently? no

## 2021-02-18 NOTE — Telephone Encounter (Signed)
Spoke with sarah, she reports the patient does not have SOB, cough or chest pain. Her weight is up and the swelling in her feet and ankles has gotten worse. She is not wearing compression hose. Her furosemide was stopped during her recent hospitalization and she is now on bumex 1 mg daily. She was instructed to increase bumex to 2 mg once daily for the next 3 days. She is to limit her fluids to 1 to 1 1/2 liters daily and watch her sodium. She will call back after 3 days if no change.

## 2021-02-19 DIAGNOSIS — F418 Other specified anxiety disorders: Secondary | ICD-10-CM | POA: Diagnosis not present

## 2021-02-19 DIAGNOSIS — E785 Hyperlipidemia, unspecified: Secondary | ICD-10-CM | POA: Diagnosis not present

## 2021-02-19 DIAGNOSIS — K219 Gastro-esophageal reflux disease without esophagitis: Secondary | ICD-10-CM | POA: Diagnosis not present

## 2021-02-19 DIAGNOSIS — K6282 Dysplasia of anus: Secondary | ICD-10-CM | POA: Diagnosis not present

## 2021-02-19 DIAGNOSIS — I872 Venous insufficiency (chronic) (peripheral): Secondary | ICD-10-CM | POA: Diagnosis not present

## 2021-02-19 DIAGNOSIS — J9601 Acute respiratory failure with hypoxia: Secondary | ICD-10-CM | POA: Diagnosis not present

## 2021-02-19 DIAGNOSIS — K5909 Other constipation: Secondary | ICD-10-CM | POA: Diagnosis not present

## 2021-02-19 DIAGNOSIS — E039 Hypothyroidism, unspecified: Secondary | ICD-10-CM | POA: Diagnosis not present

## 2021-02-19 DIAGNOSIS — M48061 Spinal stenosis, lumbar region without neurogenic claudication: Secondary | ICD-10-CM | POA: Diagnosis not present

## 2021-02-19 DIAGNOSIS — E1142 Type 2 diabetes mellitus with diabetic polyneuropathy: Secondary | ICD-10-CM | POA: Diagnosis not present

## 2021-02-19 DIAGNOSIS — J439 Emphysema, unspecified: Secondary | ICD-10-CM | POA: Diagnosis not present

## 2021-02-19 DIAGNOSIS — G2581 Restless legs syndrome: Secondary | ICD-10-CM | POA: Diagnosis not present

## 2021-02-19 DIAGNOSIS — M170 Bilateral primary osteoarthritis of knee: Secondary | ICD-10-CM | POA: Diagnosis not present

## 2021-02-19 DIAGNOSIS — Z792 Long term (current) use of antibiotics: Secondary | ICD-10-CM | POA: Diagnosis not present

## 2021-02-19 DIAGNOSIS — I130 Hypertensive heart and chronic kidney disease with heart failure and stage 1 through stage 4 chronic kidney disease, or unspecified chronic kidney disease: Secondary | ICD-10-CM | POA: Diagnosis not present

## 2021-02-19 DIAGNOSIS — G250 Essential tremor: Secondary | ICD-10-CM | POA: Diagnosis not present

## 2021-02-19 DIAGNOSIS — Z6841 Body Mass Index (BMI) 40.0 and over, adult: Secondary | ICD-10-CM | POA: Diagnosis not present

## 2021-02-19 DIAGNOSIS — E1122 Type 2 diabetes mellitus with diabetic chronic kidney disease: Secondary | ICD-10-CM | POA: Diagnosis not present

## 2021-02-19 DIAGNOSIS — I5032 Chronic diastolic (congestive) heart failure: Secondary | ICD-10-CM | POA: Diagnosis not present

## 2021-02-19 DIAGNOSIS — E1151 Type 2 diabetes mellitus with diabetic peripheral angiopathy without gangrene: Secondary | ICD-10-CM | POA: Diagnosis not present

## 2021-02-19 DIAGNOSIS — M47816 Spondylosis without myelopathy or radiculopathy, lumbar region: Secondary | ICD-10-CM | POA: Diagnosis not present

## 2021-02-19 DIAGNOSIS — G8929 Other chronic pain: Secondary | ICD-10-CM | POA: Diagnosis not present

## 2021-02-19 DIAGNOSIS — N1831 Chronic kidney disease, stage 3a: Secondary | ICD-10-CM | POA: Diagnosis not present

## 2021-02-19 DIAGNOSIS — M109 Gout, unspecified: Secondary | ICD-10-CM | POA: Diagnosis not present

## 2021-02-19 NOTE — Telephone Encounter (Signed)
Spoke to [place] from home health today and she will fax over the notes with updated medication list

## 2021-02-25 ENCOUNTER — Telehealth: Payer: Self-pay

## 2021-02-25 NOTE — Telephone Encounter (Signed)
Pt. Called stating she just finished her PT at home and they told her to call our office. She stated that her weight went up 5 lbs in one day, she is dizzy, winded, and has back pain with muscle spasms. I did schedule her with you tomorrow at 3:15p.m. She still wanted me to send you this message though per her PT.

## 2021-02-26 ENCOUNTER — Other Ambulatory Visit: Payer: Self-pay

## 2021-02-26 ENCOUNTER — Ambulatory Visit (INDEPENDENT_AMBULATORY_CARE_PROVIDER_SITE_OTHER): Payer: PPO | Admitting: Medical

## 2021-02-26 ENCOUNTER — Ambulatory Visit (INDEPENDENT_AMBULATORY_CARE_PROVIDER_SITE_OTHER): Payer: PPO

## 2021-02-26 VITALS — Ht 68.0 in | Wt 280.0 lb

## 2021-02-26 VITALS — BP 122/72 | HR 103 | Temp 97.7°F | Resp 20 | Wt 280.6 lb

## 2021-02-26 DIAGNOSIS — Z981 Arthrodesis status: Secondary | ICD-10-CM

## 2021-02-26 DIAGNOSIS — G479 Sleep disorder, unspecified: Secondary | ICD-10-CM | POA: Diagnosis not present

## 2021-02-26 DIAGNOSIS — I5032 Chronic diastolic (congestive) heart failure: Secondary | ICD-10-CM

## 2021-02-26 DIAGNOSIS — N1831 Chronic kidney disease, stage 3a: Secondary | ICD-10-CM | POA: Diagnosis not present

## 2021-02-26 DIAGNOSIS — G2581 Restless legs syndrome: Secondary | ICD-10-CM | POA: Diagnosis not present

## 2021-02-26 DIAGNOSIS — R635 Abnormal weight gain: Secondary | ICD-10-CM

## 2021-02-26 DIAGNOSIS — M48061 Spinal stenosis, lumbar region without neurogenic claudication: Secondary | ICD-10-CM | POA: Diagnosis not present

## 2021-02-26 DIAGNOSIS — Z720 Tobacco use: Secondary | ICD-10-CM

## 2021-02-26 DIAGNOSIS — Z0000 Encounter for general adult medical examination without abnormal findings: Secondary | ICD-10-CM | POA: Diagnosis not present

## 2021-02-26 DIAGNOSIS — R7303 Prediabetes: Secondary | ICD-10-CM | POA: Diagnosis not present

## 2021-02-26 DIAGNOSIS — R609 Edema, unspecified: Secondary | ICD-10-CM

## 2021-02-26 DIAGNOSIS — J431 Panlobular emphysema: Secondary | ICD-10-CM | POA: Diagnosis not present

## 2021-02-26 DIAGNOSIS — Z7189 Other specified counseling: Secondary | ICD-10-CM

## 2021-02-26 DIAGNOSIS — G629 Polyneuropathy, unspecified: Secondary | ICD-10-CM

## 2021-02-26 DIAGNOSIS — F411 Generalized anxiety disorder: Secondary | ICD-10-CM

## 2021-02-26 LAB — GLUCOSE, POCT (MANUAL RESULT ENTRY): POC Glucose: 244 mg/dl — AB (ref 70–99)

## 2021-02-26 MED ORDER — TRULICITY 0.75 MG/0.5ML ~~LOC~~ SOAJ: 0.7500 mg | SUBCUTANEOUS | 3 refills | Status: DC

## 2021-02-26 MED ORDER — BACLOFEN 10 MG PO TABS: 10.0000 mg | ORAL_TABLET | Freq: Two times a day (BID) | ORAL | 0 refills | Status: DC

## 2021-02-26 MED ORDER — HYDROCODONE-ACETAMINOPHEN 5-325 MG PO TABS: 1.0000 | ORAL_TABLET | Freq: Two times a day (BID) | ORAL | 0 refills | Status: DC | PRN

## 2021-02-26 NOTE — Patient Instructions (Signed)
[name] , Thank you for taking time to come for your Medicare Wellness Visit. I appreciate your ongoing commitment to your health goals. Please review the following plan we discussed and let me know if I can assist you in the future.   Screening recommendations/referrals: Colonoscopy: completed 01/05/2018 Mammogram: completed 11/13/2020 Bone Density: completed 08/16/2016 Recommended yearly ophthalmology/optometry visit for glaucoma screening and checkup Recommended yearly dental visit for hygiene and checkup  Vaccinations: Influenza vaccine: due Pneumococcal vaccine: completed 07/06/2016 Tdap vaccine: completed 07/02/2013, due 07/03/2023 Shingles vaccine: completed   Covid-19: 10/22/2019, 06/20/2019  Advanced directives: copy in chart  Conditions/risks identified: none  Next appointment: Follow up in one year for your annual wellness visit    Preventive Care 73 Years and Older, Female Preventive care refers to lifestyle choices and visits with your health care provider that can promote health and wellness. What does preventive care include? A yearly physical exam. This is also called an annual well check. Dental exams once or twice a year. Routine eye exams. Ask your health care provider how often you should have your eyes checked. Personal lifestyle choices, including: Daily care of your teeth and gums. Regular physical activity. Eating a healthy diet. Avoiding tobacco and drug use. Limiting alcohol use. Practicing safe sex. Taking low-dose aspirin every day. Taking vitamin and mineral supplements as recommended by your health care provider. What happens during an annual well check? The services and screenings done by your health care provider during your annual well check will depend on your age, overall health, lifestyle risk factors, and family history of disease. Counseling  Your health care provider may ask you questions about your: Alcohol use. Tobacco use. Drug  use. Emotional well-being. Home and relationship well-being. Sexual activity. Eating habits. History of falls. Memory and ability to understand (cognition). Work and work Statistician. Reproductive health. Screening  You may have the following tests or measurements: Height, weight, and BMI. Blood pressure. Lipid and cholesterol levels. These may be checked every 5 years, or more frequently if you are over 82 years old. Skin check. Lung cancer screening. You may have this screening every year starting at age 55 if you have a 30-pack-year history of smoking and currently smoke or have quit within the past 15 years. Fecal occult blood test (FOBT) of the stool. You may have this test every year starting at age 23. Flexible sigmoidoscopy or colonoscopy. You may have a sigmoidoscopy every 5 years or a colonoscopy every 10 years starting at age 73. Hepatitis C blood test. Hepatitis B blood test. Sexually transmitted disease (STD) testing. Diabetes screening. This is done by checking your blood sugar (glucose) after you have not eaten for a while (fasting). You may have this done every 1-3 years. Bone density scan. This is done to screen for osteoporosis. You may have this done starting at age 62. Mammogram. This may be done every 1-2 years. Talk to your health care provider about how often you should have regular mammograms. Talk with your health care provider about your test results, treatment options, and if necessary, the need for more tests. Vaccines  Your health care provider may recommend certain vaccines, such as: Influenza vaccine. This is recommended every year. Tetanus, diphtheria, and acellular pertussis (Tdap, Td) vaccine. You may need a Td booster every 10 years. Zoster vaccine. You may need this after age 18. Pneumococcal 13-valent conjugate (PCV13) vaccine. One dose is recommended after age 26. Pneumococcal polysaccharide (PPSV23) vaccine. One dose is recommended after age  3.  Talk to your health care provider about which screenings and vaccines you need and how often you need them. This information is not intended to replace advice given to you by your health care provider. Make sure you discuss any questions you have with your health care provider. Document Released: 05/01/2015 Document Revised: 12/23/2015 Document Reviewed: 02/03/2015 Elsevier Interactive Patient Education  2017 [employer] Prevention in the Home Falls can cause injuries. They can happen to people of all ages. There are many things you can do to make your home safe and to help prevent falls. What can I do on the outside of my home? Regularly fix the edges of walkways and driveways and fix any cracks. Remove anything that might make you trip as you walk through a door, such as a raised step or threshold. Trim any bushes or trees on the path to your home. Use bright outdoor lighting. Clear any walking paths of anything that might make someone trip, such as rocks or tools. Regularly check to see if handrails are loose or broken. Make sure that both sides of any steps have handrails. Any raised decks and porches should have guardrails on the edges. Have any leaves, snow, or ice cleared regularly. Use sand or salt on walking paths during winter. Clean up any spills in your garage right away. This includes oil or grease spills. What can I do in the bathroom? Use night lights. Install grab bars by the toilet and in the tub and shower. Do not use towel bars as grab bars. Use non-skid mats or decals in the tub or shower. If you need to sit down in the shower, use a plastic, non-slip stool. Keep the floor dry. Clean up any water that spills on the floor as soon as it happens. Remove soap buildup in the tub or shower regularly. Attach bath mats securely with double-sided non-slip rug tape. Do not have throw rugs and other things on the floor that can make you trip. What can I do in the  bedroom? Use night lights. Make sure that you have a light by your bed that is easy to reach. Do not use any sheets or blankets that are too big for your bed. They should not hang down onto the floor. Have a firm chair that has side arms. You can use this for support while you get dressed. Do not have throw rugs and other things on the floor that can make you trip. What can I do in the kitchen? Clean up any spills right away. Avoid walking on wet floors. Keep items that you use a lot in easy-to-reach places. If you need to reach something above you, use a strong step stool that has a grab bar. Keep electrical cords out of the way. Do not use floor polish or wax that makes floors slippery. If you must use wax, use non-skid floor wax. Do not have throw rugs and other things on the floor that can make you trip. What can I do with my stairs? Do not leave any items on the stairs. Make sure that there are handrails on both sides of the stairs and use them. Fix handrails that are broken or loose. Make sure that handrails are as long as the stairways. Check any carpeting to make sure that it is firmly attached to the stairs. Fix any carpet that is loose or worn. Avoid having throw rugs at the top or bottom of the stairs. If you do have throw rugs,  attach them to the floor with carpet tape. Make sure that you have a light switch at the top of the stairs and the bottom of the stairs. If you do not have them, ask someone to add them for you. What else can I do to help prevent falls? Wear shoes that: Do not have high heels. Have rubber bottoms. Are comfortable and fit you well. Are closed at the toe. Do not wear sandals. If you use a stepladder: Make sure that it is fully opened. Do not climb a closed stepladder. Make sure that both sides of the stepladder are locked into place. Ask someone to hold it for you, if possible. Clearly mark and make sure that you can see: Any grab bars or  handrails. First and last steps. Where the edge of each step is. Use tools that help you move around (mobility aids) if they are needed. These include: Canes. Walkers. Scooters. Crutches. Turn on the lights when you go into a dark area. Replace any light bulbs as soon as they burn out. Set up your furniture so you have a clear path. Avoid moving your furniture around. If any of your floors are uneven, fix them. If there are any pets around you, be aware of where they are. Review your medicines with your doctor. Some medicines can make you feel dizzy. This can increase your chance of falling. Ask your doctor what other things that you can do to help prevent falls. This information is not intended to replace advice given to you by your health care provider. Make sure you discuss any questions you have with your health care provider. Document Released: 01/29/2009 Document Revised: 09/10/2015 Document Reviewed: 05/09/2014 Elsevier Interactive Patient Education  2017 [employer].

## 2021-02-26 NOTE — Progress Notes (Signed)
Subjective: Chief Complaint  Patient presents with   swelling    Weight gain and swelling.  Gained 5 pounds yesterday and took double dose of bumex.   Patient Care Team: Magalie Almon, Camelia Eng, PA-C as PCP - General (Family Medicine) Debara Pickett Nadean Corwin, MD as PCP - Cardiology (Cardiology) Magrinat, Virgie Dad, MD as Consulting Physician (Oncology) Stark Klein, MD as Consulting Physician (General Surgery) [name], MD as Consulting Physician (Family Medicine) [name], MD as Consulting Physician (Neurosurgery) Griselda Miner, MD as Consulting Physician (Dermatology) Delice Bison, Charlestine Massed, NP as Nurse Practitioner (Hematology and Oncology) Marchia Bond, MD as Consulting Physician (Orthopedic Surgery) Clydell Hakim, MD (Inactive) as Consulting Physician (Anesthesiology) Armbruster, Carlota Raspberry, MD as Consulting Physician (Gastroenterology) [name], MD as Consulting Physician (Neurosurgery) Marcial Pacas, MD as Consulting Physician (Neurology) Viona Gilmore, [facility] as Pharmacist (Pharmacist)  Brandi Stephens is a 75 yo female with past medical history of diastolic CHF, COPD on O2, CKD-3A, HTN, HLD, hypothyroidism, morbid obesity, breast cancer s/p left breast lumpectomy, radiation and hormonal therapy, anxiety, depression and RLS   She was hospitalized October 3 through October 11 for hypoxemia, tachycardia, edema, shortness of breath with a working diagnosis for possible PE.  Overall she improved with diuretics and was discharged on 3 L of oxygen daily.  She uses a walker.  She has her oxygen with her today.  Here for recheck for swelling.  She called in within the last 48 hours complaining of a recent 5 pound weight gain on her scale.  We told her to increase Bumex to twice daily which she has done the last day and this morning she took her dose.  According to our scale here she has not had that much weight gain.  She has chronic shortness of breath on oxygen therapy for  COPD.  No new shortness of breath or chest pain.  She notes chronic edema in her legs unchanged  Her main concern today is back pain.  She has a history of chronic back pain.  She has seen Dr. Kathyrn Sheriff neurosurgery in the past.  Last visit there about a year ago.  She has had 4 prior back surgeries.  She has having a lot of spasms in her back last few days.  She is requesting something to help with pain.  She was given short-term medicine from the recent hospitalization but is out of this.  She still has some Valium left, but she does not like to use that because it makes her too sleepy.  In the past she has used hydrocodone with good relief, tramadol, baclofen.  From her recent hospitalization she has physical therapy coming out twice weekly and nurse coming out twice weekly.  The nurses getting ready to go down to once weekly.  She has been prediabetic.  She denies polyuria, polydipsia, urinary frequency.  She strongly wants something to help her lose weight.  She is wanting to go have weight loss surgery but that does not seem to be an option right now.  She cannot really exercise much given her health conditions currently.  No other aggravating or relieving factors. No other complaint.  Past Medical History:  Diagnosis Date   AIN (anal intraepithelial neoplasia) anal canal    Anemia    with pregnancy   Anxiety    Arthritis    knees, lower back, knees   Chronic constipation    Chronic diastolic CHF (congestive heart failure) ([place]) 07/05/2019   CKD (chronic kidney disease)  Coarse tremors    Essential   COPD (chronic obstructive pulmonary disease) ([place])    2017 chest CT, history of   Degenerative lumbar spinal stenosis    Depression    Depression 07/05/2019   Drug dependency ([place])    Elevated PTHrP level 05/09/2019   ETOH abuse 07/05/2019   GERD (gastroesophageal reflux disease)    pt denies   Gout    Grade I diastolic dysfunction 41/63/8453   Noted ECHO   Hemorrhoids    History  of adenomatous polyp of colon    History of basal cell carcinoma (BCC) excision 2015   nose   History of sepsis 05/14/2014   post lumbar surgery   History of shingles    x2   Hyperlipidemia    Hypertension    Hypothyroidism    Irregular heart beat    history of while undergoing radiation   Malignant neoplasm of upper-inner quadrant of left breast in female, estrogen receptor positive [facility]) oncologist-  dr Jana Hakim--  per lov in epic no recurrence   dx 07-13-2015  Left breast invasive ductal carcinoma, Stage IA, Grade 1 (TXN0),  09-16-2015  s/p  left breast lumpectomy with sln bx's,  completed radiation 11-18-2015,  started antiestrogen therapy 12-14-2015   Neuropathy    Left foot and back of left leg   Obesity (BMI 30-39.9) 07/05/2019   Personal history of radiation therapy    completed 11-18-2015  left breast   Pneumonia    PONV (postoperative nausea and vomiting)    Prediabetes    diet controlled, no med   Restless leg syndrome    Seasonal allergies    Seizure ([place]) 05/2015   due to sepsis, only one time episode   Tremor    Wears partial dentures    lower    Current Outpatient Medications on File Prior to Visit  Medication Sig Dispense Refill   albuterol (VENTOLIN HFA) 108 (90 Base) MCG/ACT inhaler INHALE 2 PUFFS BY MOUTH EVERY 4 TO 6 HOURS AS NEEDED (Patient taking differently: Inhale 2 puffs into the lungs every 4 (four) hours as needed for wheezing.) 42.5 g 0   allopurinol (ZYLOPRIM) 300 MG tablet 1 daily (Patient taking differently: Take 300 mg by mouth daily. 1 daily) 90 tablet 0   amitriptyline (ELAVIL) 75 MG tablet TAKE 1 TABLET(75 MG) BY MOUTH AT BEDTIME (Patient taking differently: Take 75 mg by mouth at bedtime.) 90 tablet 3   anastrozole (ARIMIDEX) 1 MG tablet TAKE 1 TABLET(1 MG) BY MOUTH DAILY 90 tablet 0   atorvastatin (LIPITOR) 20 MG tablet TAKE 1 TABLET BY MOUTH EVERY DAY (Patient taking differently: Take 20 mg by mouth daily.) 90 tablet 0   betamethasone  dipropionate 0.05 % cream Apply 1 application topically daily as needed (irritated skin).     Biotin 3 MG TABS Take 3 mg by mouth daily.     blood glucose meter kit and supplies KIT Dispense based on patient and insurance preference. Use up to four times daily as directed. 1 each 0   bumetanide (BUMEX) 1 MG tablet Take 1 tablet (1 mg total) by mouth daily. 30 tablet 11   cephALEXin (KEFLEX) 500 MG capsule TAKE 1 CAPSULE(500 MG) BY MOUTH THREE TIMES DAILY (Patient taking differently: Take 500 mg by mouth 3 (three) times daily.) 90 capsule 3   Cholecalciferol (D3 ADULT PO) Take 1 capsule by mouth daily.     diazepam (VALIUM) 5 MG tablet Take 1 tablet (5 mg total) by mouth daily  as needed for anxiety. 3 tablet 0   diclofenac Sodium (VOLTAREN) 1 % GEL Apply 4 g topically 4 (four) times daily. 350 g 1   docusate sodium (COLACE) 100 MG capsule Take 1 capsule (100 mg total) by mouth 3 (three) times daily. (Patient taking differently: Take 100 mg by mouth 2 (two) times daily.) 10 capsule 0   DULoxetine (CYMBALTA) 60 MG capsule TAKE 1 CAPSULE(60 MG) BY MOUTH DAILY (Patient taking differently: Take 60 mg by mouth daily.) 90 capsule 1   Fluticasone-Salmeterol (ADVAIR) 250-50 MCG/DOSE AEPB Inhale 1 puff into the lungs every 12 (twelve) hours. 60 each 11   ipratropium-albuterol (DUONEB) 0.5-2.5 (3) MG/3ML SOLN Take 3 mLs by nebulization every 6 (six) hours as needed. 360 mL 3   levothyroxine (SYNTHROID) 75 MCG tablet TAKE 1 TABLET(75 MCG) BY MOUTH DAILY BEFORE BREAKFAST (Patient taking differently: Take 75 mcg by mouth daily before breakfast.) 90 tablet 1   lidocaine (LIDODERM) 5 % UNWRAP AND APPLY 2 PATCHES TO SKIN DAILY, REMOVE AND DISCARD PATCH WITHIN 12 HOURS OR AS DIRECTED BY DOCTOR (Patient taking differently: Place 1 patch onto the skin daily.) 180 patch 1   magnesium gluconate (MAGONATE) 500 MG tablet Take 2 tablets (1,000 mg total) by mouth at bedtime. 60 tablet 0   metoprolol succinate (TOPROL-XL) 50 MG  24 hr tablet Take 1 tablet (50 mg total) by mouth daily. Take with or immediately following a meal. 30 tablet 2   Omega-3 Fatty Acids (OMEGA-3 FISH OIL PO) Take 1 capsule by mouth daily.     oxyCODONE-acetaminophen (PERCOCET/ROXICET) 5-325 MG tablet Take 1 tablet by mouth every 6 (six) hours as needed for moderate pain or severe pain. 5 tablet 0   pantoprazole (PROTONIX) 40 MG tablet Take 1 tablet (40 mg total) by mouth daily at 6 (six) AM. 30 tablet 0   polyethylene glycol powder (GLYCOLAX/MIRALAX) 17 GM/SCOOP powder Take 17 g by mouth daily as needed for moderate constipation.      primidone (MYSOLINE) 50 MG tablet TAKE 1 TABLET BY MOUTH TWICE DAILY (Patient taking differently: Take 50 mg by mouth 2 (two) times daily.) 180 tablet 3   Probiotic Product (PROBIOTIC DAILY PO) Take 1 tablet by mouth daily.     Propylene Glycol (SYSTANE BALANCE OP) Place 1 drop into both eyes 4 (four) times daily as needed (dry eyes).     QUEtiapine (SEROQUEL) 25 MG tablet TAKE 1 TABLET(25 MG) BY MOUTH AT BEDTIME (Patient taking differently: Take 25 mg by mouth at bedtime.) 90 tablet 1   rOPINIRole (REQUIP) 0.5 MG tablet TAKE 1 AND 1/2 TABLETS(0.75 MG) BY MOUTH AT BEDTIME (Patient taking differently: Take 0.75 mg by mouth at bedtime.) 140 tablet 0   vitamin B-12 (CYANOCOBALAMIN) 1000 MCG tablet Take 1 tablet (1,000 mcg total) by mouth daily. 30 tablet 0   No current facility-administered medications on file prior to visit.    ROS as in subjective    Objective: BP 122/72   Pulse (!) 103   Temp 97.7 F (36.5 C)   Resp 20   Wt 280 lb 9.6 oz (127.3 kg)   SpO2 93%   BMI 42.67 kg/m   Gen: Seated with walker, oxygen nasal cannula on with her today, otherwise no acute distress Lungs clear, no obvious wheezing or crackles Heart regular rate and rhythm, normal S1-S2, no murmurs Bilateral lower extremity edema with 2+ nonpitting edema unchanged per patient, venous stasis appearance with thicker rough skin with some  pinkish coloration throughout her  lower legs 1+ pedal pulses No upper extremity edema, normal pulses Tender throughout back, positive spasm paraspinal region mid back bilateral     Assessment: Encounter Diagnoses  Name Primary?   Weight gain Yes   Edema, unspecified type    Tobacco use    Status post lumbar spinal fusion    Spinal stenosis of lumbar region without neurogenic claudication    Sleep disturbance    RLS (restless legs syndrome)    Morbid obesity (HCC)    Neuropathy    Chronic diastolic CHF (congestive heart failure) (HCC)    Stage 3a chronic kidney disease (HCC)    Panlobular emphysema (HCC)    Generalized anxiety disorder    Prediabetes       Plan: I brought her back in today worried about worsening heart failure but her weight seems stable.  I reviewed her recent BNP lab from her hospitalization in October as well as other recent labs.  She did go up to Bumex twice daily yesterday as we discussed.  She will continue Bumex twice daily for now.  She has follow-up plans setting with pulmonology and cardiology.  There is still plan to get sleep study soon.  This is in the works  Diabetic/prediabetes-she has not been fasting for any of her labs and recent months.  Her recent hemoglobin A1c at the hospital on October 4 was 7.0%.  She ate about an hour ago and her blood sugar today is over 200.  She very likely is diabetic.  She has a glucometer at home that she just purchased.  I asked her to check it fasting every morning and write these numbers down and call us Monday with an update.  Given her strong desire to lose weight and limitations with exercise, and given the prediabetes or likelihood of diabetes I advise she go ahead and start medication.  She was agreeable to trial of Trulicity.  We have a sample and she will begin this today weekly.  We discussed risk and benefits and proper use of medication.  Her other big concern today is chronic back pain and spasm.  She  has seen neurosurgery, last visit September 2021.  At that time they were trying to help manage her pain with medication.  She has history of multiple back surgeries.  For now I will help her with pain.  I prescribed Norco for breakthrough pain and baclofen to help with spasm.  We will likely need to consider getting her back into neurosurgery for pain management.  We discussed safety and risk of pain medicine and baclofen today  Chronic heart failure-follow-up with cardiology  COPD and hypoxemia-follow-up with pulmonology  Ideally would be nice to get her off of some of her medications as she is on numerous medication, some of those are possibly duplications or could be consolidated.  She seems reluctant to give up some of her other medications.  She reports being on Cymbalta for neuropathy, amitriptyline for sleep, Seroquel for sleep, Requip for RLS, primidone for tremors.  I think there is a possibility we could get rid of some of these medications or at least try to cut down on dosing given the polypharmacy.  I recommend that we stop at least 1 of these medications.  She is willing to possibly stop Seroquel as it has a likelihood of adding to weight gain and she is already on another sleep aid amitriptyline.  I am going to refer for [facility] medication eval with the pharmacist for their  input on this  Plan for repeat BMET lab within the next 2 to 3 weeks, plan a fasting follow-up at that time as well given the new medication Trulicity   Addendum: reviewed medicare wellness performed by separate nurse today   Brandi Stephens was seen today for swelling.  Diagnoses and all orders for this visit:  Weight gain  Edema, unspecified type  Tobacco use  Status post lumbar spinal fusion  Spinal stenosis of lumbar region without neurogenic claudication  Sleep disturbance  RLS (restless legs syndrome)  Morbid obesity (HCC)  Neuropathy  Chronic diastolic CHF (congestive heart failure)  (HCC)  Stage 3a chronic kidney disease (HCC)  Panlobular emphysema (HCC)  Generalized anxiety disorder  Prediabetes -     Glucose (CBG)  Other orders -     baclofen (LIORESAL) 10 MG tablet; Take 1 tablet (10 mg total) by mouth 2 (two) times daily. -     HYDROcodone-acetaminophen (NORCO) 5-325 MG tablet; Take 1 tablet by mouth 2 (two) times daily as needed. -     Dulaglutide (TRULICITY) 6.38 GT/3.6IW SOPN; Inject 0.75 mg into the skin once a week.  F/u with call back next week

## 2021-02-26 NOTE — Progress Notes (Signed)
I connected with Benancio Deeds today by telephone and verified that I am speaking with the correct person using two identifiers. Location patient: home Location provider: work Persons participating in the virtual visit: Brandi Stephens, Rozar LPN.   I discussed the limitations, risks, security and privacy concerns of performing an evaluation and management service by telephone and the availability of in person appointments. I also discussed with the patient that there may be a patient responsible charge related to this service. The patient expressed understanding and verbally consented to this telephonic visit.    Interactive audio and video telecommunications were attempted between this provider and patient, however failed, due to patient having technical difficulties OR patient did not have access to video capability.  We continued and completed visit with audio only.     Vital signs may be patient reported or missing.  Subjective:   Brandi Stephens is a 75 y.o. female who presents for Medicare Annual (Subsequent) preventive examination.  Review of Systems     Cardiac Risk Factors include: advanced age (>73mn, >>58women);obesity (BMI >30kg/m2);sedentary lifestyle     Objective:    Today's Vitals   02/26/21 1117 02/26/21 1119  Weight: 280 lb (127 kg)   Height: '5\' 8"'  (1.727 m)   PainSc:  9    Body mass index is 42.57 kg/m.  Advanced Directives 02/26/2021 10/30/2019 08/02/2019 07/11/2019 06/28/2019 06/25/2019 06/24/2019  Does Patient Have a Medical Advance Directive? Yes Yes No No No - No  Type of [place] Living will - - - - -  Does patient want to make changes to medical advance directive? - - - - - - -  Copy of [place] Chart? Yes - validated most recent copy scanned in chart (See row information) - - - - - -  Would patient like information on creating a medical advance directive? - - Yes (Inpatient -  patient defers creating a medical advance directive at this time - Information given);No - Patient declined No - Patient declined No - Patient declined No - Patient declined Yes (ED - Information included in AVS)    Current Medications (verified) Outpatient Encounter Medications as of 02/26/2021  Medication Sig   albuterol (VENTOLIN HFA) 108 (90 Base) MCG/ACT inhaler INHALE 2 PUFFS BY MOUTH EVERY 4 TO 6 HOURS AS NEEDED (Patient taking differently: Inhale 2 puffs into the lungs every 4 (four) hours as needed for wheezing.)   allopurinol (ZYLOPRIM) 300 MG tablet 1 daily (Patient taking differently: Take 300 mg by mouth daily. 1 daily)   amitriptyline (ELAVIL) 75 MG tablet TAKE 1 TABLET(75 MG) BY MOUTH AT BEDTIME (Patient taking differently: Take 75 mg by mouth at bedtime.)   anastrozole (ARIMIDEX) 1 MG tablet TAKE 1 TABLET(1 MG) BY MOUTH DAILY   atorvastatin (LIPITOR) 20 MG tablet TAKE 1 TABLET BY MOUTH EVERY DAY (Patient taking differently: Take 20 mg by mouth daily.)   betamethasone dipropionate 0.05 % cream Apply 1 application topically daily as needed (irritated skin).   Biotin 3 MG TABS Take 3 mg by mouth daily.   blood glucose meter kit and supplies KIT Dispense based on patient and insurance preference. Use up to four times daily as directed.   bumetanide (BUMEX) 1 MG tablet Take 1 tablet (1 mg total) by mouth daily.   cephALEXin (KEFLEX) 500 MG capsule TAKE 1 CAPSULE(500 MG) BY MOUTH THREE TIMES DAILY (Patient taking differently: Take 500 mg by mouth 3 (three) times  daily.)   Cholecalciferol (D3 ADULT PO) Take 1 capsule by mouth daily.   diazepam (VALIUM) 5 MG tablet Take 1 tablet (5 mg total) by mouth daily as needed for anxiety.   diclofenac Sodium (VOLTAREN) 1 % GEL Apply 4 g topically 4 (four) times daily.   docusate sodium (COLACE) 100 MG capsule Take 1 capsule (100 mg total) by mouth 3 (three) times daily. (Patient taking differently: Take 100 mg by mouth 2 (two) times daily.)    DULoxetine (CYMBALTA) 60 MG capsule TAKE 1 CAPSULE(60 MG) BY MOUTH DAILY (Patient taking differently: Take 60 mg by mouth daily.)   Fluticasone-Salmeterol (ADVAIR) 250-50 MCG/DOSE AEPB Inhale 1 puff into the lungs every 12 (twelve) hours.   levothyroxine (SYNTHROID) 75 MCG tablet TAKE 1 TABLET(75 MCG) BY MOUTH DAILY BEFORE BREAKFAST (Patient taking differently: Take 75 mcg by mouth daily before breakfast.)   lidocaine (LIDODERM) 5 % UNWRAP AND APPLY 2 PATCHES TO SKIN DAILY, REMOVE AND DISCARD PATCH WITHIN 12 HOURS OR AS DIRECTED BY DOCTOR (Patient taking differently: Place 1 patch onto the skin daily.)   magnesium gluconate (MAGONATE) 500 MG tablet Take 2 tablets (1,000 mg total) by mouth at bedtime.   metoprolol succinate (TOPROL-XL) 50 MG 24 hr tablet Take 1 tablet (50 mg total) by mouth daily. Take with or immediately following a meal.   Omega-3 Fatty Acids (OMEGA-3 FISH OIL PO) Take 1 capsule by mouth daily.   oxyCODONE-acetaminophen (PERCOCET/ROXICET) 5-325 MG tablet Take 1 tablet by mouth every 6 (six) hours as needed for moderate pain or severe pain.   pantoprazole (PROTONIX) 40 MG tablet Take 1 tablet (40 mg total) by mouth daily at 6 (six) AM.   polyethylene glycol powder (GLYCOLAX/MIRALAX) 17 GM/SCOOP powder Take 17 g by mouth daily as needed for moderate constipation.    primidone (MYSOLINE) 50 MG tablet TAKE 1 TABLET BY MOUTH TWICE DAILY (Patient taking differently: Take 50 mg by mouth 2 (two) times daily.)   Probiotic Product (PROBIOTIC DAILY PO) Take 1 tablet by mouth daily.   Propylene Glycol (SYSTANE BALANCE OP) Place 1 drop into both eyes 4 (four) times daily as needed (dry eyes).   QUEtiapine (SEROQUEL) 25 MG tablet TAKE 1 TABLET(25 MG) BY MOUTH AT BEDTIME (Patient taking differently: Take 25 mg by mouth at bedtime.)   rOPINIRole (REQUIP) 0.5 MG tablet TAKE 1 AND 1/2 TABLETS(0.75 MG) BY MOUTH AT BEDTIME (Patient taking differently: Take 0.75 mg by mouth at bedtime.)   vitamin B-12  (CYANOCOBALAMIN) 1000 MCG tablet Take 1 tablet (1,000 mcg total) by mouth daily.   ipratropium-albuterol (DUONEB) 0.5-2.5 (3) MG/3ML SOLN Take 3 mLs by nebulization every 6 (six) hours as needed. (Patient not taking: Reported on 02/26/2021)   No facility-administered encounter medications on file as of 02/26/2021.    Allergies (verified) Ampicillin, Gabapentin, Penicillins, Codeine, and Other   History: Past Medical History:  Diagnosis Date   AIN (anal intraepithelial neoplasia) anal canal    Anemia    with pregnancy   Anxiety    Arthritis    knees, lower back, knees   Chronic constipation    Chronic diastolic CHF (congestive heart failure) ([place]) 07/05/2019   CKD (chronic kidney disease)    Coarse tremors    Essential   COPD (chronic obstructive pulmonary disease) ([place])    2017 chest CT, history of   Degenerative lumbar spinal stenosis    Depression    Depression 07/05/2019   Drug dependency ([place])    Elevated PTHrP level 05/09/2019  ETOH abuse 07/05/2019   GERD (gastroesophageal reflux disease)    pt denies   Gout    Grade I diastolic dysfunction 35/00/9381   Noted ECHO   Hemorrhoids    History of adenomatous polyp of colon    History of basal cell carcinoma (BCC) excision 2015   nose   History of sepsis 05/14/2014   post lumbar surgery   History of shingles    x2   Hyperlipidemia    Hypertension    Hypothyroidism    Irregular heart beat    history of while undergoing radiation   Malignant neoplasm of upper-inner quadrant of left breast in female, estrogen receptor positive [facility]) oncologist-  dr Jana Hakim--  per lov in epic no recurrence   dx 07-13-2015  Left breast invasive ductal carcinoma, Stage IA, Grade 1 (TXN0),  09-16-2015  s/p  left breast lumpectomy with sln bx's,  completed radiation 11-18-2015,  started antiestrogen therapy 12-14-2015   Neuropathy    Left foot and back of left leg   Obesity (BMI 30-39.9) 07/05/2019   Personal history of radiation therapy     completed 11-18-2015  left breast   Pneumonia    PONV (postoperative nausea and vomiting)    Prediabetes    diet controlled, no med   Restless leg syndrome    Seasonal allergies    Seizure ([place]) 05/2015   due to sepsis, only one time episode   Tremor    Wears partial dentures    lower    Past Surgical History:  Procedure Laterality Date   ABDOMINAL HYSTERECTOMY  1985   BASAL CELL CARCINOMA EXCISION  10/15   nose   BREAST BIOPSY Right 08/24/2015   BREAST BIOPSY Right 07/13/2015   BREAST BIOPSY Left 07/13/2015   BREAST EXCISIONAL BIOPSY Left 1972   BREAST LUMPECTOMY Left    BREAST LUMPECTOMY WITH RADIOACTIVE SEED AND SENTINEL LYMPH NODE BIOPSY Left 09/16/2015   Procedure: BREAST LUMPECTOMY WITH RADIOACTIVE SEED AND SENTINEL LYMPH NODE BIOPSY;  Surgeon: Stark Klein, MD;  Location: [place];  Service: General;  Laterality: Left;   BREAST REDUCTION SURGERY Bilateral 1998   CATARACT EXTRACTION W/ INTRAOCULAR LENS IMPLANT Left 2016   CERVICAL SPINE SURGERY  1995   hallo   COLONOSCOPY  01/2013   polyps   EYE SURGERY Left 2016   HAND SURGERY Left 02-19-2002   dr Fredna Dow  '@[email or web address]'    repair collateral ligament/ MPJ left thumb   HARDWARE REMOVAL N/A 07/24/2019   Procedure: REVISION OF HARDWARE Lumbar two - Sacral one. Extention of Fusion to Lumbar one;  Surgeon: [name], MD;  Location: [place];  Service: Neurosurgery;  Laterality: N/A;   HEMORRHOID SURGERY  2008   HIGH RESOLUTION ANOSCOPY N/A 02/28/2018   Procedure: HIGH RESOLUTION ANOSCOPY WITH BIOPSY;  Surgeon: Leighton Ruff, MD;  Location: [place];  Service: General;  Laterality: N/A;   KNEE ARTHROSCOPY Right 2002   LUMBAR SPINE SURGERY  03-20-2015   dr Kathyrn Sheriff   fusion L4-5, L5-S1   MOUTH SURGERY  07/14/2018   2 infected teeth removed   MULTIPLE TOOTH EXTRACTIONS     with bone grafting lower bottom right and left   REDUCTION MAMMAPLASTY Bilateral    RIGHT COLECTOMY  09-10-2003   dr  Margot Chimes '@[facility]'    multiple colon polyps (per path tubular adenoma's, hyperplastic , benign appendix, benign two lymph nodes   ROTATOR CUFF REPAIR Left 04/2016   TONSILLECTOMY  1969   TUBAL LIGATION Bilateral 1982  Family History  Problem Relation Age of Onset   Atrial fibrillation Mother    Ulcers Father    Breast cancer Maternal Aunt    Lung cancer Maternal Uncle    Stomach cancer Maternal Grandmother    Lung cancer Maternal Aunt    Brain cancer Maternal Aunt    Prostate cancer Maternal Grandfather    Stroke Paternal Grandmother    Tuberculosis Paternal Grandfather    Colon cancer Neg Hx    Esophageal cancer Neg Hx    Rectal cancer Neg Hx    Social History   Socioeconomic History   Marital status: Married    Spouse name: robert   Number of children: 3   Years of education: college   Highest education level: Not on file  Occupational History   Occupation: retired  Tobacco Use   Smoking status: Former    Packs/day: 0.50    Years: 35.00    Pack years: 17.50    Types: Cigarettes    Quit date: 05/22/2018    Years since quitting: 2.7   Smokeless tobacco: Never  Vaping Use   Vaping Use: Never used  Substance and Sexual Activity   Alcohol use: Yes    Comment: 1 glass of wine a night   Drug use: Not Currently    Comment: CBD tincture   Sexual activity: Yes    Birth control/protection: Post-menopausal  Other Topics Concern   Not on file  Social History Narrative   Lives with husband in a 2 story home.  Has 2 living children.  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  Education: college.    Right-handed.   Five cups coffee per week.   Social Determinants of Health   Financial Resource Strain: Low Risk    Difficulty of Paying Living Expenses: Not hard at all  Food Insecurity: No Food Insecurity   Worried About Charity fundraiser in the Last Year: Never true   [place] in the Last Year: Never true  Transportation Needs: No Transportation Needs   Lack of  Transportation (Medical): No   Lack of Transportation (Non-Medical): No  Physical Activity: Inactive   Days of Exercise per Week: 0 days   Minutes of Exercise per Session: 0 min  Stress: Stress Concern Present   Feeling of Stress : To some extent  Social Connections: Not on file    Tobacco Counseling Counseling given: Not Answered   Clinical Intake:  Pre-visit preparation completed: Yes  Pain : 0-10 Pain Score: 9  Pain Type: Chronic pain Pain Location: Back Pain Descriptors / Indicators: Spasm Pain Onset: More than a month ago Pain Frequency: Constant     Nutritional Status: BMI > 30  Obese Nutritional Risks: Nausea/ vomitting/ diarrhea (nausea with pain) Diabetes: No  How often do you need to have someone help you when you read instructions, pamphlets, or other written materials from your doctor or pharmacy?: 1 - Never What is the last grade level you completed in school?: college  Diabetic? no  Interpreter Needed?: No  Information entered by :: NAllen LPN   Activities of Daily Living In your present state of health, do you have any difficulty performing the following activities: 02/26/2021  Hearing? N  Vision? N  Difficulty concentrating or making decisions? N  Walking or climbing stairs? Y  Comment due to back  Dressing or bathing? N  Doing errands, shopping? Y  Comment husband Restaurant manager, fast food and eating ? N  Using the Toilet? N  In the past six months, have you accidently leaked urine? Y  Do you have problems with loss of bowel control? N  Managing your Medications? N  Managing your Finances? N  Housekeeping or managing your Housekeeping? N  Some recent data might be hidden    Patient Care Team: Tysinger, Camelia Eng, PA-C as PCP - General (Family Medicine) Debara Pickett Nadean Corwin, MD as PCP - Cardiology (Cardiology) Magrinat, Virgie Dad, MD as Consulting Physician (Oncology) Stark Klein, MD as Consulting Physician (General Surgery) [name],  MD as Consulting Physician (Family Medicine) [name], MD as Consulting Physician (Neurosurgery) Griselda Miner, MD as Consulting Physician (Dermatology) Delice Bison, Charlestine Massed, NP as Nurse Practitioner (Hematology and Oncology) Marchia Bond, MD as Consulting Physician (Orthopedic Surgery) Clydell Hakim, MD (Inactive) as Consulting Physician (Anesthesiology) Armbruster, Carlota Raspberry, MD as Consulting Physician (Gastroenterology) [name], MD as Consulting Physician (Neurosurgery) Marcial Pacas, MD as Consulting Physician (Neurology) Viona Gilmore, [facility] as Pharmacist (Pharmacist)  Indicate any recent Medical Services you may have received from other than [facility] providers in the past year (date may be approximate).     Assessment:   This is a routine wellness examination for Kelsi.  Hearing/Vision screen Vision Screening - Comments:: Regular eye exams, [facility]  Dietary issues and exercise activities discussed: Current Exercise Habits: The patient does not participate in regular exercise at present   Goals Addressed             This Visit's Progress    Patient Stated       02/26/2021, walk without walker       Depression Screen PHQ 2/9 Scores 02/26/2021 08/07/2020 08/05/2020 08/04/2020 06/10/2020 02/20/2020 01/23/2020  PHQ - 2 Score 0 0 0 0 0 0 0  PHQ- 9 Score - - - - - - -    Fall Risk Fall Risk  02/26/2021 08/07/2020 08/05/2020 08/04/2020 06/10/2020  Falls in the past year? '1 1 1 ' - 1  Comment tripped - - - -  Number falls in past yr: 1 0 0 0 1  Injury with Fall? '1 1 1 1 ' 0  Comment knocked out teeth - - - -  Risk for fall due to : History of fall(s);Impaired balance/gait;Impaired mobility;Medication side effect History of fall(s) No Fall Risks History of fall(s) -  Follow up Falls evaluation completed;Education provided;Falls prevention discussed Falls evaluation completed Falls evaluation completed Falls evaluation completed -    FALL RISK  PREVENTION PERTAINING TO THE HOME:  Any stairs in or around the home? Yes  If so, are there any without handrails? no Home free of loose throw rugs in walkways, pet beds, electrical cords, etc? Yes  Adequate lighting in your home to reduce risk of falls? Yes   ASSISTIVE DEVICES UTILIZED TO PREVENT FALLS:  Life alert? No  Use of a cane, walker or w/c? Yes  Grab bars in the bathroom? Yes  Shower chair or bench in shower? Yes  Elevated toilet seat or a handicapped toilet? No   TIMED UP AND GO:  Was the test performed? No .      Cognitive Function:     6CIT Screen 02/26/2021  What Year? 0 points  What month? 0 points  What time? 0 points  Count back from 20 0 points  Months in reverse 0 points  Repeat phrase 2 points  Total Score 2    Immunizations Immunization History  Administered Date(s) Administered   Fluad Quad(high Dose 65+) 12/20/2018,  01/22/2020   Influenza, High Dose Seasonal PF 05/01/2017   Influenza-Unspecified 12/17/2013, 12/12/2017   PFIZER Comirnaty(Gray Top)Covid-19 Tri-Sucrose Vaccine 09/20/2019, 10/22/2019   Pneumococcal Conjugate-13 01/14/2014   Pneumococcal Polysaccharide-23 07/06/2016   Tdap 07/02/2013   Zoster Recombinat (Shingrix) 12/12/2017, 01/04/2019    TDAP status: Up to date  Flu Vaccine status: Due, Education has been provided regarding the importance of this vaccine. Advised may receive this vaccine at local pharmacy or Health Dept. Aware to provide a copy of the vaccination record if obtained from local pharmacy or Health Dept. Verbalized acceptance and understanding.  Pneumococcal vaccine status: Up to date  Covid-19 vaccine status: Completed vaccines  Qualifies for Shingles Vaccine? Yes   Zostavax completed No   Shingrix Completed?: Yes  Screening Tests Health Maintenance  Topic Date Due   COVID-19 Vaccine (3 - Pfizer risk series) 11/19/2019   COLONOSCOPY (Pts 45-69yr Insurance coverage will need to be confirmed)  01/05/2021    INFLUENZA VACCINE  07/16/2021 (Originally 11/16/2020)   MAMMOGRAM  11/14/2022   TETANUS/TDAP  07/03/2023   Pneumonia Vaccine 75 Years old  Completed   DEXA SCAN  Completed   Hepatitis C Screening  Completed   Zoster Vaccines- Shingrix  Completed   HPV VACCINES  Aged Out    Health Maintenance  Health Maintenance Due  Topic Date Due   COVID-19 Vaccine (3 - Pfizer risk series) 11/19/2019   COLONOSCOPY (Pts 45-421yrInsurance coverage will need to be confirmed)  01/05/2021    Colorectal cancer screening: Type of screening: Colonoscopy. Completed 01/05/2018. Repeat every 10 years  Mammogram status: Completed 11/13/2020. Repeat every year  Bone Density status: Completed 08/16/2016.   Lung Cancer Screening: (Low Dose CT Chest recommended if Age 74-80ears, 30 pack-year currently smoking OR have quit w/in 15years.) does not qualify.   Lung Cancer Screening Referral: no  Additional Screening:  Hepatitis C Screening: does qualify; Completed 07/08/2016  Vision Screening: Recommended annual ophthalmology exams for early detection of glaucoma and other disorders of the eye. Is the patient up to date with their annual eye exam?  Yes  Who is the provider or what is the name of the office in which the patient attends annual eye exams[facility], would they like to be referred to a provider to establish care? No .   Dental Screening: Recommended annual dental exams for proper oral hygiene  Community Resource Referral / Chronic Care Management: CRR required this visit?  No   CCM required this visit?  No      Plan:     I have personally reviewed and noted the following in the patient's chart:   Medical and social history Use of alcohol, tobacco or illicit drugs  Current medications and supplements including opioid prescriptions.  Functional ability and status Nutritional status Physical activity Advanced directives List of other  physicians Hospitalizations, surgeries, and ER visits in previous 12 months Vitals Screenings to include cognitive, depression, and falls Referrals and appointments  In addition, I have reviewed and discussed with patient certain preventive protocols, quality metrics, and best practice recommendations. A written personalized care plan for preventive services as well as general preventive health recommendations were provided to patient.     NiKellie SimmeringLPN   [id number] Nurse Notes: none

## 2021-03-01 ENCOUNTER — Telehealth: Payer: Self-pay

## 2021-03-01 NOTE — Addendum Note (Signed)
Addended by: Minette Headland A on: 03/01/2021 12:02 PM   Modules accepted: Orders

## 2021-03-01 NOTE — Telephone Encounter (Signed)
Pt. Called stating she has not started using the blood glucose monitor yet because she has been sick since Saturday. She also said that she need some help using it so she has someone coming to help her with it once she gets over the cold she currently has. I offered her a virtual apt. But pt. Declined at this time.

## 2021-03-02 ENCOUNTER — Other Ambulatory Visit: Payer: Self-pay | Admitting: Family Medicine

## 2021-03-02 DIAGNOSIS — K5909 Other constipation: Secondary | ICD-10-CM | POA: Diagnosis not present

## 2021-03-02 DIAGNOSIS — I130 Hypertensive heart and chronic kidney disease with heart failure and stage 1 through stage 4 chronic kidney disease, or unspecified chronic kidney disease: Secondary | ICD-10-CM | POA: Diagnosis not present

## 2021-03-02 DIAGNOSIS — E039 Hypothyroidism, unspecified: Secondary | ICD-10-CM | POA: Diagnosis not present

## 2021-03-02 DIAGNOSIS — J9601 Acute respiratory failure with hypoxia: Secondary | ICD-10-CM | POA: Diagnosis not present

## 2021-03-02 DIAGNOSIS — G250 Essential tremor: Secondary | ICD-10-CM | POA: Diagnosis not present

## 2021-03-02 DIAGNOSIS — G2581 Restless legs syndrome: Secondary | ICD-10-CM | POA: Diagnosis not present

## 2021-03-02 DIAGNOSIS — G8929 Other chronic pain: Secondary | ICD-10-CM | POA: Diagnosis not present

## 2021-03-02 DIAGNOSIS — E1142 Type 2 diabetes mellitus with diabetic polyneuropathy: Secondary | ICD-10-CM | POA: Diagnosis not present

## 2021-03-02 DIAGNOSIS — F418 Other specified anxiety disorders: Secondary | ICD-10-CM | POA: Diagnosis not present

## 2021-03-02 DIAGNOSIS — Z6841 Body Mass Index (BMI) 40.0 and over, adult: Secondary | ICD-10-CM | POA: Diagnosis not present

## 2021-03-02 DIAGNOSIS — J439 Emphysema, unspecified: Secondary | ICD-10-CM | POA: Diagnosis not present

## 2021-03-02 DIAGNOSIS — M170 Bilateral primary osteoarthritis of knee: Secondary | ICD-10-CM | POA: Diagnosis not present

## 2021-03-02 DIAGNOSIS — K219 Gastro-esophageal reflux disease without esophagitis: Secondary | ICD-10-CM | POA: Diagnosis not present

## 2021-03-02 DIAGNOSIS — K6282 Dysplasia of anus: Secondary | ICD-10-CM | POA: Diagnosis not present

## 2021-03-02 DIAGNOSIS — I872 Venous insufficiency (chronic) (peripheral): Secondary | ICD-10-CM | POA: Diagnosis not present

## 2021-03-02 DIAGNOSIS — Z792 Long term (current) use of antibiotics: Secondary | ICD-10-CM | POA: Diagnosis not present

## 2021-03-02 DIAGNOSIS — M47816 Spondylosis without myelopathy or radiculopathy, lumbar region: Secondary | ICD-10-CM | POA: Diagnosis not present

## 2021-03-02 DIAGNOSIS — M109 Gout, unspecified: Secondary | ICD-10-CM | POA: Diagnosis not present

## 2021-03-02 DIAGNOSIS — I5032 Chronic diastolic (congestive) heart failure: Secondary | ICD-10-CM | POA: Diagnosis not present

## 2021-03-02 DIAGNOSIS — M48061 Spinal stenosis, lumbar region without neurogenic claudication: Secondary | ICD-10-CM | POA: Diagnosis not present

## 2021-03-02 DIAGNOSIS — E785 Hyperlipidemia, unspecified: Secondary | ICD-10-CM | POA: Diagnosis not present

## 2021-03-02 DIAGNOSIS — E1151 Type 2 diabetes mellitus with diabetic peripheral angiopathy without gangrene: Secondary | ICD-10-CM | POA: Diagnosis not present

## 2021-03-02 DIAGNOSIS — E1122 Type 2 diabetes mellitus with diabetic chronic kidney disease: Secondary | ICD-10-CM | POA: Diagnosis not present

## 2021-03-02 DIAGNOSIS — Z7189 Other specified counseling: Secondary | ICD-10-CM

## 2021-03-02 DIAGNOSIS — N1831 Chronic kidney disease, stage 3a: Secondary | ICD-10-CM | POA: Diagnosis not present

## 2021-03-03 DIAGNOSIS — I130 Hypertensive heart and chronic kidney disease with heart failure and stage 1 through stage 4 chronic kidney disease, or unspecified chronic kidney disease: Secondary | ICD-10-CM | POA: Diagnosis not present

## 2021-03-03 DIAGNOSIS — E1142 Type 2 diabetes mellitus with diabetic polyneuropathy: Secondary | ICD-10-CM | POA: Diagnosis not present

## 2021-03-03 DIAGNOSIS — F418 Other specified anxiety disorders: Secondary | ICD-10-CM | POA: Diagnosis not present

## 2021-03-03 DIAGNOSIS — K5909 Other constipation: Secondary | ICD-10-CM | POA: Diagnosis not present

## 2021-03-03 DIAGNOSIS — E785 Hyperlipidemia, unspecified: Secondary | ICD-10-CM | POA: Diagnosis not present

## 2021-03-03 DIAGNOSIS — J9601 Acute respiratory failure with hypoxia: Secondary | ICD-10-CM | POA: Diagnosis not present

## 2021-03-03 DIAGNOSIS — E1122 Type 2 diabetes mellitus with diabetic chronic kidney disease: Secondary | ICD-10-CM | POA: Diagnosis not present

## 2021-03-03 DIAGNOSIS — I5032 Chronic diastolic (congestive) heart failure: Secondary | ICD-10-CM | POA: Diagnosis not present

## 2021-03-03 DIAGNOSIS — M47816 Spondylosis without myelopathy or radiculopathy, lumbar region: Secondary | ICD-10-CM | POA: Diagnosis not present

## 2021-03-03 DIAGNOSIS — M170 Bilateral primary osteoarthritis of knee: Secondary | ICD-10-CM | POA: Diagnosis not present

## 2021-03-03 DIAGNOSIS — J439 Emphysema, unspecified: Secondary | ICD-10-CM | POA: Diagnosis not present

## 2021-03-03 DIAGNOSIS — K6282 Dysplasia of anus: Secondary | ICD-10-CM | POA: Diagnosis not present

## 2021-03-03 DIAGNOSIS — N1831 Chronic kidney disease, stage 3a: Secondary | ICD-10-CM | POA: Diagnosis not present

## 2021-03-03 DIAGNOSIS — I872 Venous insufficiency (chronic) (peripheral): Secondary | ICD-10-CM | POA: Diagnosis not present

## 2021-03-03 DIAGNOSIS — Z792 Long term (current) use of antibiotics: Secondary | ICD-10-CM | POA: Diagnosis not present

## 2021-03-03 DIAGNOSIS — G8929 Other chronic pain: Secondary | ICD-10-CM | POA: Diagnosis not present

## 2021-03-03 DIAGNOSIS — K219 Gastro-esophageal reflux disease without esophagitis: Secondary | ICD-10-CM | POA: Diagnosis not present

## 2021-03-03 DIAGNOSIS — G2581 Restless legs syndrome: Secondary | ICD-10-CM | POA: Diagnosis not present

## 2021-03-03 DIAGNOSIS — G250 Essential tremor: Secondary | ICD-10-CM | POA: Diagnosis not present

## 2021-03-03 DIAGNOSIS — E039 Hypothyroidism, unspecified: Secondary | ICD-10-CM | POA: Diagnosis not present

## 2021-03-03 DIAGNOSIS — E1151 Type 2 diabetes mellitus with diabetic peripheral angiopathy without gangrene: Secondary | ICD-10-CM | POA: Diagnosis not present

## 2021-03-03 DIAGNOSIS — M48061 Spinal stenosis, lumbar region without neurogenic claudication: Secondary | ICD-10-CM | POA: Diagnosis not present

## 2021-03-03 DIAGNOSIS — Z6841 Body Mass Index (BMI) 40.0 and over, adult: Secondary | ICD-10-CM | POA: Diagnosis not present

## 2021-03-03 DIAGNOSIS — M109 Gout, unspecified: Secondary | ICD-10-CM | POA: Diagnosis not present

## 2021-03-05 ENCOUNTER — Telehealth: Payer: Self-pay

## 2021-03-05 ENCOUNTER — Other Ambulatory Visit: Payer: Self-pay | Admitting: Medical

## 2021-03-05 DIAGNOSIS — G2581 Restless legs syndrome: Secondary | ICD-10-CM

## 2021-03-05 NOTE — Telephone Encounter (Signed)
Pt. Called back to report her  BS readings from this week. On 03/02/21 in the a.m it was 245, 03/03/21 it was 126, 03/04/21 it was 128, and 03/05/21 it was 110 all of these reading were done in morning. Just let her know what she needs to do from here.

## 2021-03-08 DIAGNOSIS — J439 Emphysema, unspecified: Secondary | ICD-10-CM | POA: Diagnosis not present

## 2021-03-08 DIAGNOSIS — J9601 Acute respiratory failure with hypoxia: Secondary | ICD-10-CM | POA: Diagnosis not present

## 2021-03-08 DIAGNOSIS — Z6841 Body Mass Index (BMI) 40.0 and over, adult: Secondary | ICD-10-CM | POA: Diagnosis not present

## 2021-03-08 DIAGNOSIS — E039 Hypothyroidism, unspecified: Secondary | ICD-10-CM | POA: Diagnosis not present

## 2021-03-08 DIAGNOSIS — I5032 Chronic diastolic (congestive) heart failure: Secondary | ICD-10-CM | POA: Diagnosis not present

## 2021-03-08 DIAGNOSIS — K219 Gastro-esophageal reflux disease without esophagitis: Secondary | ICD-10-CM | POA: Diagnosis not present

## 2021-03-08 DIAGNOSIS — M47816 Spondylosis without myelopathy or radiculopathy, lumbar region: Secondary | ICD-10-CM | POA: Diagnosis not present

## 2021-03-08 DIAGNOSIS — F418 Other specified anxiety disorders: Secondary | ICD-10-CM | POA: Diagnosis not present

## 2021-03-08 DIAGNOSIS — N1831 Chronic kidney disease, stage 3a: Secondary | ICD-10-CM | POA: Diagnosis not present

## 2021-03-08 DIAGNOSIS — M170 Bilateral primary osteoarthritis of knee: Secondary | ICD-10-CM | POA: Diagnosis not present

## 2021-03-08 DIAGNOSIS — M109 Gout, unspecified: Secondary | ICD-10-CM | POA: Diagnosis not present

## 2021-03-08 DIAGNOSIS — G8929 Other chronic pain: Secondary | ICD-10-CM | POA: Diagnosis not present

## 2021-03-08 DIAGNOSIS — K5909 Other constipation: Secondary | ICD-10-CM | POA: Diagnosis not present

## 2021-03-08 DIAGNOSIS — E1142 Type 2 diabetes mellitus with diabetic polyneuropathy: Secondary | ICD-10-CM | POA: Diagnosis not present

## 2021-03-08 DIAGNOSIS — I130 Hypertensive heart and chronic kidney disease with heart failure and stage 1 through stage 4 chronic kidney disease, or unspecified chronic kidney disease: Secondary | ICD-10-CM | POA: Diagnosis not present

## 2021-03-08 DIAGNOSIS — G250 Essential tremor: Secondary | ICD-10-CM | POA: Diagnosis not present

## 2021-03-08 DIAGNOSIS — E1151 Type 2 diabetes mellitus with diabetic peripheral angiopathy without gangrene: Secondary | ICD-10-CM | POA: Diagnosis not present

## 2021-03-08 DIAGNOSIS — E1122 Type 2 diabetes mellitus with diabetic chronic kidney disease: Secondary | ICD-10-CM | POA: Diagnosis not present

## 2021-03-08 DIAGNOSIS — M48061 Spinal stenosis, lumbar region without neurogenic claudication: Secondary | ICD-10-CM | POA: Diagnosis not present

## 2021-03-08 DIAGNOSIS — E785 Hyperlipidemia, unspecified: Secondary | ICD-10-CM | POA: Diagnosis not present

## 2021-03-08 DIAGNOSIS — G2581 Restless legs syndrome: Secondary | ICD-10-CM | POA: Diagnosis not present

## 2021-03-08 DIAGNOSIS — I872 Venous insufficiency (chronic) (peripheral): Secondary | ICD-10-CM | POA: Diagnosis not present

## 2021-03-08 DIAGNOSIS — K6282 Dysplasia of anus: Secondary | ICD-10-CM | POA: Diagnosis not present

## 2021-03-08 DIAGNOSIS — Z792 Long term (current) use of antibiotics: Secondary | ICD-10-CM | POA: Diagnosis not present

## 2021-03-09 ENCOUNTER — Telehealth: Payer: Self-pay

## 2021-03-09 NOTE — Telephone Encounter (Signed)
She is seeing Mrs. Stovall today at 12 if you could get to it before then she can give it to her today. She can be reached at [phone].

## 2021-03-09 NOTE — Telephone Encounter (Signed)
Nurse LVM on my phone about pt and to check on msg below.  Attempted to call back and went to voicemail - included on VM to call back tomorrow about status as we are still seeing patients at this time in office.   Also wanted to clarify the "multiple" insulins they have on file for her / complete med rec

## 2021-03-09 NOTE — Telephone Encounter (Signed)
Brandi Stephens from [facility] called wanting orders to give her, her once a week insulin injection. She needs to know which one to give her and the quantity. Pt. Has still not started taking it yet because she says she doesn't know how to give it to herself.

## 2021-03-10 NOTE — Telephone Encounter (Signed)
Pt was notified of results

## 2021-03-15 DIAGNOSIS — E1142 Type 2 diabetes mellitus with diabetic polyneuropathy: Secondary | ICD-10-CM | POA: Diagnosis not present

## 2021-03-15 DIAGNOSIS — K6282 Dysplasia of anus: Secondary | ICD-10-CM | POA: Diagnosis not present

## 2021-03-15 DIAGNOSIS — I130 Hypertensive heart and chronic kidney disease with heart failure and stage 1 through stage 4 chronic kidney disease, or unspecified chronic kidney disease: Secondary | ICD-10-CM | POA: Diagnosis not present

## 2021-03-15 DIAGNOSIS — K219 Gastro-esophageal reflux disease without esophagitis: Secondary | ICD-10-CM | POA: Diagnosis not present

## 2021-03-15 DIAGNOSIS — E1151 Type 2 diabetes mellitus with diabetic peripheral angiopathy without gangrene: Secondary | ICD-10-CM | POA: Diagnosis not present

## 2021-03-15 DIAGNOSIS — G8929 Other chronic pain: Secondary | ICD-10-CM | POA: Diagnosis not present

## 2021-03-15 DIAGNOSIS — K5909 Other constipation: Secondary | ICD-10-CM | POA: Diagnosis not present

## 2021-03-15 DIAGNOSIS — J439 Emphysema, unspecified: Secondary | ICD-10-CM | POA: Diagnosis not present

## 2021-03-15 DIAGNOSIS — I5032 Chronic diastolic (congestive) heart failure: Secondary | ICD-10-CM | POA: Diagnosis not present

## 2021-03-15 DIAGNOSIS — E785 Hyperlipidemia, unspecified: Secondary | ICD-10-CM | POA: Diagnosis not present

## 2021-03-15 DIAGNOSIS — J9601 Acute respiratory failure with hypoxia: Secondary | ICD-10-CM | POA: Diagnosis not present

## 2021-03-15 DIAGNOSIS — N1831 Chronic kidney disease, stage 3a: Secondary | ICD-10-CM | POA: Diagnosis not present

## 2021-03-15 DIAGNOSIS — M170 Bilateral primary osteoarthritis of knee: Secondary | ICD-10-CM | POA: Diagnosis not present

## 2021-03-15 DIAGNOSIS — Z6841 Body Mass Index (BMI) 40.0 and over, adult: Secondary | ICD-10-CM | POA: Diagnosis not present

## 2021-03-15 DIAGNOSIS — I872 Venous insufficiency (chronic) (peripheral): Secondary | ICD-10-CM | POA: Diagnosis not present

## 2021-03-15 DIAGNOSIS — Z792 Long term (current) use of antibiotics: Secondary | ICD-10-CM | POA: Diagnosis not present

## 2021-03-15 DIAGNOSIS — E1122 Type 2 diabetes mellitus with diabetic chronic kidney disease: Secondary | ICD-10-CM | POA: Diagnosis not present

## 2021-03-15 DIAGNOSIS — G2581 Restless legs syndrome: Secondary | ICD-10-CM | POA: Diagnosis not present

## 2021-03-15 DIAGNOSIS — M47816 Spondylosis without myelopathy or radiculopathy, lumbar region: Secondary | ICD-10-CM | POA: Diagnosis not present

## 2021-03-15 DIAGNOSIS — G250 Essential tremor: Secondary | ICD-10-CM | POA: Diagnosis not present

## 2021-03-15 DIAGNOSIS — E039 Hypothyroidism, unspecified: Secondary | ICD-10-CM | POA: Diagnosis not present

## 2021-03-15 DIAGNOSIS — M109 Gout, unspecified: Secondary | ICD-10-CM | POA: Diagnosis not present

## 2021-03-15 DIAGNOSIS — M48061 Spinal stenosis, lumbar region without neurogenic claudication: Secondary | ICD-10-CM | POA: Diagnosis not present

## 2021-03-15 DIAGNOSIS — F418 Other specified anxiety disorders: Secondary | ICD-10-CM | POA: Diagnosis not present

## 2021-03-18 ENCOUNTER — Telehealth: Payer: Self-pay | Admitting: Medical

## 2021-03-18 NOTE — Telephone Encounter (Signed)
Pt has an appt in January for pulmonology. I have faxed form back this morning and will see if they will accept it

## 2021-03-18 NOTE — Telephone Encounter (Signed)
Not on oxygen before hospitalization  Has a pulmonology but doesn't have upcoming appointment yet with them.  Oz staying around 88-90 if not on o2 but on 2 liters it will go up to 100%. She used to be on 3 liters.

## 2021-03-18 NOTE — Telephone Encounter (Signed)
Left message for pt to call me back 

## 2021-03-18 NOTE — Telephone Encounter (Signed)
I received a certificate of medical necessity for oxygen  Remind me, was she on oxygen prior to the hospitalization recently?  Is she seeing pulmonology?  If not we will need to do an overnight oxygen saturation test or an in office oximetry test with her off oxygen to determine how low she goes.  This is to be able to prove that she needs to remain on oxygen.    How often is she using oxygen currently and how many liters per minute?

## 2021-03-22 NOTE — Progress Notes (Signed)
Brandi Stephens  [phone]) [phone] Fax:(336) [phone]     ID: Brandi Stephens DOB: 06-05-1945  MR#: [id number]  OBS#:[id number]  Patient Care Team: Brandi Stephens as PCP - General (Family Medicine) Brandi Pickett Nadean Corwin, MD as PCP - Cardiology (Cardiology) Brandi Stephens, Brandi Dad, MD as Consulting Physician (Oncology) Brandi Klein, MD as Consulting Physician (General Surgery) Brandi Lung, MD as Consulting Physician (Family Medicine) Brandi Lose, MD as Consulting Physician (Neurosurgery) Brandi Miner, MD as Consulting Physician (Dermatology) Brandi Stephens, Brandi Massed, NP as Nurse Stephens (Hematology and Oncology) Brandi Bond, MD as Consulting Physician (Orthopedic Surgery) Brandi Hakim, MD (Inactive) as Consulting Physician (Anesthesiology) Brandi Stephens, Brandi Raspberry, MD as Consulting Physician (Gastroenterology) Brandi Lose, MD as Consulting Physician (Neurosurgery) Brandi Pacas, MD as Consulting Physician (Neurology) Brandi Stephens, [facility] as Pharmacist (Pharmacist) OTHER MD:  CHIEF COMPLAINT: Estrogen receptor positive breast cancer  CURRENT TREATMENT: Completing 5 years anastrozole   INTERVAL HISTORY: Brandi Stephens returns today for follow-up of her estrogen receptor positive breast cancer.  She has had a rocky course since her last visit here, all of and unrelated to breast cancer.  Specifically she was admitted October 2022 with acute respiratory failure with hypoxia, felt to be due to a combination of acute on chronic diastolic heart failure.  Echo the month prior had shown an ejection fraction in the 60-65% range.  She did improve with diuretics and she is now on 24-hour oxygen.  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  She is tolerating it well, with some heat intolerance and hot flashes as her main side effect.  She has had minimal  arthralgias with this.  Her most recent bone density screening from 08/2016 showed a T-score of -2.0, which is considered osteopenic.  We had written for a repeat but for some reason that was not scheduled   Since her last visit, she underwent bilateral diagnostic mammography with tomography at [facility] on 11/13/2020 showing: breast density category C; no evidence of malignancy in either breast.    REVIEW OF SYSTEMS: Brandi Stephens tells me this is only the second time she has left her house with the portable oxygen and of course she is terrified that the oxygen machine will not work.  Currently she denies unusual headaches visual changes nausea vomiting or problems with falls.  Any activity is more than she can do and just walking through the hallway to get to her room here was difficult for her.  Otherwise her review of systems is as noted above.   COVID 19 VACCINATION STATUS: Pfizer x2 as of December 2022     BREAST CANCER HISTORY: From the original intake note:  Brandi Stephens started to feel some right breast pain and soreness in March 2017. She brought it to Brandi Stephens, Brandi Stephens and since attention, and she was set up for bilateral diagnostic mammography with tomography at the [facility] 07/02/2015 area did the breast density was category C. The patient is status post bilateral reduction mammoplasties. Mammography showed no suspicious masses or calcifications in the right breast, and ultrasonography of the area where she has discomfort shows an area of distortion in the right breast at the 8:00 position 12 cm from the nipple measuring 1.4 cm thought to represent scarring related to the prior reduction surgery, but warranting further evaluation.  Also, in the left breast there was an area of distortion in  the upper inner quadrant noted mammographically. Targeted ultrasound evaluating the left breast found no sonographic correlate to the mammographic findings. Ultrasound  evaluation of both axillae were benign.  On 07/13/2015 Brandi Stephens underwent biopsy of the right breast area in question which showed only fibrocystic changes with adenosis. Biopsy of the left breast upper inner quadrant however showed invasive ductal carcinoma, grade 1, estrogen receptor 90% positive, progesterone receptor 70% positive, WITH strong staining intensity, with an MIB-1 of 3%, and no HER-2 amplification, the signals ratio being 0.97 and the number per cell 1.65.  The patient's subsequent history is as detailed below   PAST MEDICAL HISTORY: Past Medical History:  Diagnosis Date   AIN (anal intraepithelial neoplasia) anal canal    Anemia    with pregnancy   Anxiety    Arthritis    knees, lower back, knees   Chronic constipation    Chronic diastolic CHF (congestive heart failure) ([place]) 07/05/2019   CKD (chronic kidney disease)    Coarse tremors    Essential   COPD (chronic obstructive pulmonary disease) ([place])    2017 chest CT, history of   Degenerative lumbar spinal stenosis    Depression    Depression 07/05/2019   Drug dependency ([place])    Elevated PTHrP level 05/09/2019   ETOH abuse 07/05/2019   GERD (gastroesophageal reflux disease)    pt denies   Gout    Grade I diastolic dysfunction 38/75/6433   Noted ECHO   Hemorrhoids    History of adenomatous polyp of colon    History of basal cell carcinoma (BCC) excision 2015   nose   History of sepsis 05/14/2014   post lumbar surgery   History of shingles    x2   Hyperlipidemia    Hypertension    Hypothyroidism    Irregular heart beat    history of while undergoing radiation   Malignant neoplasm of upper-inner quadrant of left breast in female, estrogen receptor positive [facility]) oncologist-  dr Jana Stephens--  per lov in epic no recurrence   dx 07-13-2015  Left breast invasive ductal carcinoma, Stage IA, Grade 1 (TXN0),  09-16-2015  s/p  left breast lumpectomy with sln bx's,  completed radiation 11-18-2015,  started antiestrogen  therapy 12-14-2015   Neuropathy    Left foot and back of left leg   Obesity (BMI 30-39.9) 07/05/2019   Personal history of radiation therapy    completed 11-18-2015  left breast   Pneumonia    PONV (postoperative nausea and vomiting)    Prediabetes    diet controlled, no med   Restless leg syndrome    Seasonal allergies    Seizure ([place]) 05/2015   due to sepsis, only one time episode   Tremor    Wears partial dentures    lower     PAST SURGICAL HISTORY: Past Surgical History:  Procedure Laterality Date   ABDOMINAL HYSTERECTOMY  1985   BASAL CELL CARCINOMA EXCISION  10/15   nose   BREAST BIOPSY Right 08/24/2015   BREAST BIOPSY Right 07/13/2015   BREAST BIOPSY Left 07/13/2015   BREAST EXCISIONAL BIOPSY Left 1972   BREAST LUMPECTOMY Left    BREAST LUMPECTOMY WITH RADIOACTIVE SEED AND SENTINEL LYMPH NODE BIOPSY Left 09/16/2015   Procedure: BREAST LUMPECTOMY WITH RADIOACTIVE SEED AND SENTINEL LYMPH NODE BIOPSY;  Surgeon: Brandi Klein, MD;  Location: [facility];  Service: General;  Laterality: Left;   BREAST REDUCTION SURGERY Bilateral 1998   CATARACT EXTRACTION W/ INTRAOCULAR LENS IMPLANT Left  2016   [place]   hallo   COLONOSCOPY  01/2013   polyps   EYE SURGERY Left 2016   HAND SURGERY Left 02-19-2002   dr Fredna Dow  _0    repair collateral ligament/ MPJ left thumb   HARDWARE REMOVAL N/A 07/24/2019   Procedure: REVISION OF HARDWARE Lumbar two - Sacral one. Extention of Fusion to Lumbar one;  Surgeon: Brandi Lose, MD;  Location: [facility];  Service: Neurosurgery;  Laterality: N/A;   HEMORRHOID SURGERY  2008   HIGH RESOLUTION ANOSCOPY N/A 02/28/2018   Procedure: HIGH RESOLUTION ANOSCOPY WITH BIOPSY;  Surgeon: Leighton Ruff, MD;  Location: [facility];  Service: General;  Laterality: N/A;   KNEE ARTHROSCOPY Right 2002   LUMBAR SPINE SURGERY  03-20-2015   dr Kathyrn Sheriff   fusion L4-5, L5-S1   MOUTH SURGERY  07/14/2018   2 infected  teeth removed   MULTIPLE TOOTH EXTRACTIONS     with bone grafting lower bottom right and left   REDUCTION MAMMAPLASTY Bilateral    RIGHT COLECTOMY  09-10-2003   dr Margot Chimes _1    multiple colon polyps (per path tubular adenoma's, hyperplastic , benign appendix, benign two lymph nodes   ROTATOR CUFF REPAIR Left 04/2016   TONSILLECTOMY  1969   TUBAL LIGATION Bilateral 1982    FAMILY HISTORY Family History  Problem Relation Age of Onset   Atrial fibrillation Mother    Ulcers Father    Breast cancer Maternal Aunt    Stephens cancer Maternal Uncle    Stomach cancer Maternal Grandmother    Stephens cancer Maternal Aunt    Brain cancer Maternal Aunt    Prostate cancer Maternal Grandfather    Stroke Paternal Grandmother    Tuberculosis Paternal Grandfather    Colon cancer Neg Hx    Esophageal cancer Neg Hx    Rectal cancer Neg Hx   The patient's father died at the age of 80 and her mother at the age of 40. The patient had no brothers, 2 sisters. The patient's mother was diagnosed with uterine cancer at the age of 10. The patient's sister was diagnosed with labial cancer at the age of 37. There is a maternal aunt with a history of breast cancer but the patient does not know at what age she was diagnosed. There is also on the mother's side history of Stephens cancer brain cancer and stomach cancer.    GYNECOLOGIC HISTORY:  No LMP recorded. Patient has had a hysterectomy.  Menarche age 76 first live birth age 64, the patient is [place] P3. She underwent hysterectomy without salpingo-oophorectomy at age 75. She took hormone replacement only for a few months.    SOCIAL HISTORY:   the patient is a retired Architect. Her husband, currently 78 years old, is also retired. He used to work for [employer]. Daughter [name] lives in [place] and she is a Wellsite geologist, currently not employed. Daughter [name] lives in [place] and has a Brewing technologist in social work but is not currently working. Son  [name] died at age 3 from causes not clear after autopsy. He had had significant neurologic damage following an accident . She has one grandchild.    ADVANCED DIRECTIVES:  not in place    HEALTH MAINTENANCE: Social History   Tobacco Use   Smoking status: Former    Packs/day: 0.50    Years: 35.00    Pack years: 17.50    Types: Cigarettes    Quit date: 05/22/2018  Years since quitting: 2.8   Smokeless tobacco: Never  Vaping Use   Vaping Use: Never used  Substance Use Topics   Alcohol use: Yes    Comment: 1 glass of wine a night   Drug use: Not Currently    Comment: CBD tincture     Colonoscopy: 2015/[facility]   PAP:  Bone density: 2016  Lipid panel:  Allergies  Allergen Reactions   Ampicillin Hives and Other (See Comments)    Severe reaction in February 2017 1.5 month to have hives to go away   Gabapentin Swelling    UNSPECIFIED REACTION    Penicillins Hives and Other (See Comments)    Severe reaction in February 2017 1.5 month to have hives to go away Has patient had a PCN reaction causing immediate rash, facial/tongue/throat swelling, SOB or lightheadedness with hypotension: #  #  #  YES  #  #  #  Has patient had a PCN reaction causing severe rash involving mucus membranes or skin necrosis: No Has patient had a PCN reaction that required hospitalization No Has patient had a PCN reaction occurring within the last 10 years: No    Codeine Nausea And Vomiting   Other Itching    UNSPECIFIED Analgesics    Current Outpatient Medications  Medication Sig Dispense Refill   albuterol (VENTOLIN HFA) 108 (90 Base) MCG/ACT inhaler INHALE 2 PUFFS BY MOUTH EVERY 4 TO 6 HOURS AS NEEDED (Patient taking differently: Inhale 2 puffs into the lungs every 4 (four) hours as needed for wheezing.) 42.5 g 0   allopurinol (ZYLOPRIM) 300 MG tablet 1 daily (Patient taking differently: Take 300 mg by mouth daily. 1 daily) 90 tablet 0   amitriptyline (ELAVIL) 75 MG tablet TAKE 1 TABLET(75 MG)  BY MOUTH AT BEDTIME (Patient taking differently: Take 75 mg by mouth at bedtime.) 90 tablet 3   anastrozole (ARIMIDEX) 1 MG tablet TAKE 1 TABLET(1 MG) BY MOUTH DAILY 90 tablet 0   atorvastatin (LIPITOR) 20 MG tablet TAKE 1 TABLET BY MOUTH EVERY DAY (Patient taking differently: Take 20 mg by mouth daily.) 90 tablet 0   baclofen (LIORESAL) 10 MG tablet Take 1 tablet (10 mg total) by mouth 2 (two) times daily. 30 each 0   betamethasone dipropionate 0.05 % cream Apply 1 application topically daily as needed (irritated skin).     Biotin 3 MG TABS Take 3 mg by mouth daily.     blood glucose meter kit and supplies KIT Dispense based on patient and insurance preference. Use up to four times daily as directed. 1 each 0   bumetanide (BUMEX) 1 MG tablet Take 1 tablet (1 mg total) by mouth daily. 30 tablet 11   cephALEXin (KEFLEX) 500 MG capsule TAKE 1 CAPSULE(500 MG) BY MOUTH THREE TIMES DAILY (Patient taking differently: Take 500 mg by mouth 3 (three) times daily.) 90 capsule 3   Cholecalciferol (D3 ADULT PO) Take 1 capsule by mouth daily.     diazepam (VALIUM) 5 MG tablet Take 1 tablet (5 mg total) by mouth daily as needed for anxiety. 3 tablet 0   diclofenac Sodium (VOLTAREN) 1 % GEL Apply 4 g topically 4 (four) times daily. 350 g 1   docusate sodium (COLACE) 100 MG capsule Take 1 capsule (100 mg total) by mouth 3 (three) times daily. (Patient taking differently: Take 100 mg by mouth 2 (two) times daily.) 10 capsule 0   Dulaglutide (TRULICITY) 0.25 KY/7.0WC SOPN Inject 0.75 mg into the skin once a week. 0.5  mL 3   DULoxetine (CYMBALTA) 60 MG capsule TAKE 1 CAPSULE(60 MG) BY MOUTH DAILY (Patient taking differently: Take 60 mg by mouth daily.) 90 capsule 1   Fluticasone-Salmeterol (ADVAIR) 250-50 MCG/DOSE AEPB Inhale 1 puff into the lungs every 12 (twelve) hours. 60 each 11   HYDROcodone-acetaminophen (NORCO) 5-325 MG tablet Take 1 tablet by mouth 2 (two) times daily as needed. 20 tablet 0    ipratropium-albuterol (DUONEB) 0.5-2.5 (3) MG/3ML SOLN Take 3 mLs by nebulization every 6 (six) hours as needed. 360 mL 3   levothyroxine (SYNTHROID) 75 MCG tablet TAKE 1 TABLET(75 MCG) BY MOUTH DAILY BEFORE BREAKFAST (Patient taking differently: Take 75 mcg by mouth daily before breakfast.) 90 tablet 1   lidocaine (LIDODERM) 5 % UNWRAP AND APPLY 2 PATCHES TO SKIN DAILY, REMOVE AND DISCARD PATCH WITHIN 12 HOURS OR AS DIRECTED BY DOCTOR (Patient taking differently: Place 1 patch onto the skin daily.) 180 patch 1   magnesium gluconate (MAGONATE) 500 MG tablet Take 2 tablets (1,000 mg total) by mouth at bedtime. 60 tablet 0   metoprolol succinate (TOPROL-XL) 50 MG 24 hr tablet Take 1 tablet (50 mg total) by mouth daily. Take with or immediately following a meal. 30 tablet 2   Omega-3 Fatty Acids (OMEGA-3 FISH OIL PO) Take 1 capsule by mouth daily.     oxyCODONE-acetaminophen (PERCOCET/ROXICET) 5-325 MG tablet Take 1 tablet by mouth every 6 (six) hours as needed for moderate pain or severe pain. 5 tablet 0   pantoprazole (PROTONIX) 40 MG tablet Take 1 tablet (40 mg total) by mouth daily at 6 (six) AM. 30 tablet 0   polyethylene glycol powder (GLYCOLAX/MIRALAX) 17 GM/SCOOP powder Take 17 g by mouth daily as needed for moderate constipation.      primidone (MYSOLINE) 50 MG tablet TAKE 1 TABLET BY MOUTH TWICE DAILY (Patient taking differently: Take 50 mg by mouth 2 (two) times daily.) 180 tablet 3   Probiotic Product (PROBIOTIC DAILY PO) Take 1 tablet by mouth daily.     Propylene Glycol (SYSTANE BALANCE OP) Place 1 drop into both eyes 4 (four) times daily as needed (dry eyes).     QUEtiapine (SEROQUEL) 25 MG tablet TAKE 1 TABLET(25 MG) BY MOUTH AT BEDTIME (Patient taking differently: Take 25 mg by mouth at bedtime.) 90 tablet 1   rOPINIRole (REQUIP) 0.5 MG tablet Take 1.5 tablets (0.75 mg total) by mouth at bedtime. 135 tablet 0   vitamin B-12 (CYANOCOBALAMIN) 1000 MCG tablet Take 1 tablet (1,000 mcg total)  by mouth daily. 30 tablet 0   No current facility-administered medications for this visit.    OBJECTIVE: White woman carrying a portable oxygen concentrator and receiving oxygen through nasal cannula  Vitals:   03/23/21 1325  BP: (!) 141/81  Pulse: (!) 120  Resp: 18  Temp: 97.7 F (36.5 C)  SpO2: 96%      Body mass index is 41.11 kg/m.    ECOG FS:2 - Symptomatic, <50% confined to bed Filed Weights   03/23/21 1325  Weight: 270 lb 6.4 oz (122.7 kg)    Sclerae unicteric, EOMs intact Wearing a mask No cervical or supraclavicular adenopathy Lungs no rales or rhonchi Heart regular rate and rhythm Abd soft, obese, nontender, positive bowel sounds MSK no focal spinal tenderness, no upper extremity lymphedema Neuro: nonfocal, well oriented, appropriate affect Breasts: The right breast is benign.  The left breast is status postlumpectomy and radiation.  There is no evidence of local recurrence.  Both axillae are benign.  LAB RESULTS:  CMP     Component Value Date/Time   NA 140 02/05/2021 0811   NA 142 06/28/2016 1458   K 5.1 02/05/2021 0811   K 5.2 (H) 06/28/2016 1458   CL 102 02/05/2021 0811   CO2 20 02/05/2021 0811   CO2 24 06/28/2016 1458   GLUCOSE 149 (H) 02/05/2021 0811   GLUCOSE 185 (H) 01/25/2021 0022   GLUCOSE 114 06/28/2016 1458   BUN 27 02/05/2021 0811   BUN 23.6 06/28/2016 1458   CREATININE 0.95 02/05/2021 0811   CREATININE 1.11 (H) 02/18/2020 1545   CREATININE 1.0 06/28/2016 1458   CALCIUM 10.2 02/05/2021 0811   CALCIUM 10.3 06/28/2016 1458   PROT 7.6 06/15/2020 1037   PROT 6.8 06/28/2016 1458   ALBUMIN 3.7 01/19/2021 1500   ALBUMIN 5.0 (H) 06/15/2020 1037   ALBUMIN 3.6 06/28/2016 1458   AST 22 06/15/2020 1037   AST 18 06/28/2016 1458   ALT 22 06/15/2020 1037   ALT 22 06/28/2016 1458   ALKPHOS 129 (H) 06/15/2020 1037   ALKPHOS 101 06/28/2016 1458   BILITOT 0.4 06/15/2020 1037   BILITOT 0.48 06/28/2016 1458   GFRNONAA 38 (L) 01/26/2021 0518    GFRAA 52 (L) 06/04/2020 1511    INo results found for: SPEP, UPEP  Lab Results  Component Value Date   WBC 12.7 (H) 03/23/2021   NEUTROABS 9.7 (H) 03/23/2021   HGB 13.0 03/23/2021   HCT 39.4 03/23/2021   MCV 98.0 03/23/2021   PLT 282 03/23/2021      Chemistry      Component Value Date/Time   NA 140 02/05/2021 0811   NA 142 06/28/2016 1458   K 5.1 02/05/2021 0811   K 5.2 (H) 06/28/2016 1458   CL 102 02/05/2021 0811   CO2 20 02/05/2021 0811   CO2 24 06/28/2016 1458   BUN 27 02/05/2021 0811   BUN 23.6 06/28/2016 1458   CREATININE 0.95 02/05/2021 0811   CREATININE 1.11 (H) 02/18/2020 1545   CREATININE 1.0 06/28/2016 1458      Component Value Date/Time   CALCIUM 10.2 02/05/2021 0811   CALCIUM 10.3 06/28/2016 1458   ALKPHOS 129 (H) 06/15/2020 1037   ALKPHOS 101 06/28/2016 1458   AST 22 06/15/2020 1037   AST 18 06/28/2016 1458   ALT 22 06/15/2020 1037   ALT 22 06/28/2016 1458   BILITOT 0.4 06/15/2020 1037   BILITOT 0.48 06/28/2016 1458       No results found for: LABCA2  No components found for: LABCA125  No results for input(s): INR in the last 168 hours.  Urinalysis    Component Value Date/Time   COLORURINE AMBER (A) 07/04/2019 1300   APPEARANCEUR CLOUDY (A) 07/04/2019 1300   LABSPEC 1.026 07/04/2019 1300   LABSPEC 1.025 06/24/2019 1426   PHURINE 5.0 07/04/2019 1300   GLUCOSEU NEGATIVE 07/04/2019 1300   HGBUR NEGATIVE 07/04/2019 1300   BILIRUBINUR MODERATE (A) 07/04/2019 1300   BILIRUBINUR moderate (A) 06/24/2019 1426   BILIRUBINUR n 11/25/2015 1712   KETONESUR NEGATIVE 07/04/2019 1300   PROTEINUR NEGATIVE 07/04/2019 1300   UROBILINOGEN negative 11/25/2015 1712   NITRITE NEGATIVE 07/04/2019 1300   LEUKOCYTESUR MODERATE (A) 07/04/2019 1300    ELIGIBLE FOR AVAILABLE RESEARCH PROTOCOL: no  STUDIES: No results found.   ASSESSMENT: 75 y.o. [place] woman  Status post left breast upper inner quadrant biopsy 07/13/2015 for a clinical [place] N0 invasive  ductal carcinoma, grade 1, estrogen and progesterone receptor positive, HER-2 nonamplified, with an MIB-1  of 3%.  (a) biopsy of a suspicious lesion in the right breast proved to be a small fibroadenoma  (1) left lumpectomy and sentinel lymph node sampling 09/16/2015 showed a pT1a pN0 invasive ductal carcinoma, grade 1, with negative margins, stage IA    (2) adjuvant radiation completed 11/18/2015  (3) anastrozole started 12/14/2015, interrupted August 2020, resumed OCT 2020  (a) bone density on 08/16/2016 showed a T score of of -2.0, osteopenia left hip, improved from prior 2002  (b) completed 5 years of anastrozole December 2022   PLAN: Brandi Stephens is now 5-1/2 years out from definitive surgery for her breast cancer with no evidence of disease recurrence.  This is very favorable.  She is completing 5 years of anastrozole.  Given that she was node-negative and had a very early stage disease there is no need to continue anastrozole beyond 5 years.  Accordingly she is stopping at this point.  I am comfortable releasing the Brandi Stephens to her other physicians at this point.  From a breast cancer point of view all she will need is her yearly mammography and a yearly physician breast exam  We will be glad to see her again at any point in the future if and when the need arises but as of now are making no further routine appointments for her here.  Total encounter time 20 minutes.*  Brandi Stephens, Brandi Dad, MD  03/23/21 1:40 PM Medical Oncology and Hematology [facility] [place], [place] [address] Tel. [phone]    Fax. [phone]   I, Wilburn Mylar, am acting as scribe for Dr. Virgie Stephens. Brandi Stephens.  I, Lurline Del MD, have reviewed the above documentation for accuracy and completeness, and I agree with the above.   *Total Encounter Time as defined by the Centers for Medicare and Medicaid Services includes, in addition to the face-to-face time of a patient visit  (documented in the note above) non-face-to-face time: obtaining and reviewing outside history, ordering and reviewing medications, tests or procedures, care coordination (communications with other health care professionals or caregivers) and documentation in the medical record.

## 2021-03-23 ENCOUNTER — Other Ambulatory Visit: Payer: Self-pay

## 2021-03-23 ENCOUNTER — Inpatient Hospital Stay (HOSPITAL_BASED_OUTPATIENT_CLINIC_OR_DEPARTMENT_OTHER): Payer: PPO | Admitting: Oncology

## 2021-03-23 ENCOUNTER — Inpatient Hospital Stay: Payer: PPO | Attending: Oncology

## 2021-03-23 VITALS — BP 141/81 | HR 120 | Temp 97.7°F | Resp 18 | Ht 68.0 in | Wt 270.4 lb

## 2021-03-23 DIAGNOSIS — N6310 Unspecified lump in the right breast, unspecified quadrant: Secondary | ICD-10-CM

## 2021-03-23 DIAGNOSIS — N189 Chronic kidney disease, unspecified: Secondary | ICD-10-CM | POA: Insufficient documentation

## 2021-03-23 DIAGNOSIS — I5032 Chronic diastolic (congestive) heart failure: Secondary | ICD-10-CM | POA: Diagnosis not present

## 2021-03-23 DIAGNOSIS — C50212 Malignant neoplasm of upper-inner quadrant of left female breast: Secondary | ICD-10-CM | POA: Diagnosis not present

## 2021-03-23 DIAGNOSIS — Z170 Estrogen receptor positive status [ER+]: Secondary | ICD-10-CM | POA: Insufficient documentation

## 2021-03-23 DIAGNOSIS — I130 Hypertensive heart and chronic kidney disease with heart failure and stage 1 through stage 4 chronic kidney disease, or unspecified chronic kidney disease: Secondary | ICD-10-CM | POA: Insufficient documentation

## 2021-03-23 LAB — CBC WITH DIFFERENTIAL/PLATELET
Abs Immature Granulocytes: 0.17 10*3/uL — ABNORMAL HIGH (ref 0.00–0.07)
Basophils Absolute: 0.1 10*3/uL (ref 0.0–0.1)
Basophils Relative: 1 %
Eosinophils Absolute: 0.3 10*3/uL (ref 0.0–0.5)
Eosinophils Relative: 2 %
HCT: 39.4 % (ref 36.0–46.0)
Hemoglobin: 13 g/dL (ref 12.0–15.0)
Immature Granulocytes: 1 %
Lymphocytes Relative: 12 %
Lymphs Abs: 1.6 10*3/uL (ref 0.7–4.0)
MCH: 32.3 pg (ref 26.0–34.0)
MCHC: 33 g/dL (ref 30.0–36.0)
MCV: 98 fL (ref 80.0–100.0)
Monocytes Absolute: 0.9 10*3/uL (ref 0.1–1.0)
Monocytes Relative: 7 %
Neutro Abs: 9.7 10*3/uL — ABNORMAL HIGH (ref 1.7–7.7)
Neutrophils Relative %: 77 %
Platelets: 282 10*3/uL (ref 150–400)
RBC: 4.02 MIL/uL (ref 3.87–5.11)
RDW: 14.6 % (ref 11.5–15.5)
WBC: 12.7 10*3/uL — ABNORMAL HIGH (ref 4.0–10.5)
nRBC: 0 % (ref 0.0–0.2)

## 2021-03-23 NOTE — Progress Notes (Signed)
Subjective:    Patient ID: Brandi Stephens, female    DOB: Aug 16, 1945, 75 y.o.   MRN: [id number]  Brandi Stephens presents for follow-up after CIR admission for staph bacteremia after infection of her spinal hardware. Everything is the same since last visit. She is having more trouble with neuropathy in her feet and hands. The Cymbalta 60mg  does help somewhat. She used to get swelling with Gabapentin. The pain is bad during the day and night. Pain is improved to 4. She cannot feel her two little toes. Discussed Lyrica as an option. She would like try the Sprint PNS. Her back pain is the most severe. She has hardware. She follows with Dr. Bridget Hartshorn at the end of the month. The pain in her back feels like a dagger. The pain is more directly on the spine.   The Amitriptyline helps her to sleep a little better. She has improved sleep from 1 to 4 hours. She is having severe neuropathy in her left foot. The Cymbalta is helping with the neuropathic pain in her hands. She has been tolerating this medication well.   Last visit I transitioned her to outaptient therapy and she has an appointment today. She has noted improvements in her strength. She is ambulating steadily with RW today. She has bene trying using a cane with therapy. Outpatient therapy is doing well. She has been taking 1/2 a tablet of hydrocodone to help her tolerate better (prescribed by her surgeon).   Pain is currently 5/10 and worst in her lower back where her spinal harware is present as well as in paraspinals. No longer acquiring She has been taking approximately 1 percocet per day and would like to gradually stop using these. She has also been using four 650mg  of Tylenol per day and two 200mg  ibuprofen daily.   She no longer has severe diarrhea or constipation and is much happier about this. It does come and go. She takes 1000mg  magensium at night. She takes colace BID. She likes to eat healthy foods.   Her daughter accompanies her this visit  and helps to provide history. She and her daughter have been out of work for 5 years due to her health conditions and need for her daughter's assistance. Her grandchild recently graduated kindergarten.   1) B/l lower extremity edema She continues to have mild bilateral lower extremity edema.  Discussed use of compression garments, elevation, and ice.  Discussed the benefits of lymphedema therapy and she is willing to try this.  -She has been having pooling of fluids throughout her body -She has never seen nephrology but has followed with cardiology -Lasix administration is limited due to her creatinine.   2) Bilateral knees: -she has fallen on her knees so many times.  -she has been using blue emu oil. -she has been using voltaren gel.  -she has a history of arthroscopic surgery and steroid injections -she uses turmeric  3) Muscle spasms -the Valium does help  4) Peripheral neuropathy: -60mg  Cymbalta is helping. -She would like to use Qutenza today.  -she would to try the pain journal.  -it is really affecting her quality of life  5) Prediabetes -has been eating very healthy foods  6) Anxiety: -they Valium helps- she uses as needed.  -she appreciates her husband's and daughter's care.   5) Dizziness -feels related to going out, anxiety.  6) obesity -has been trying to lose weight     Pain Inventory Average Pain 4 Pain Right Now 2 My  pain is constant, sharp, burning, stabbing and aching  In the last 24 hours, has pain interfered with the following? General activity 4 Relation with others 0 Enjoyment of life 5 What TIME of day is your pain at its worst?  varies Sleep (in general) Poor  Pain is worse with: walking, bending, sitting and standing Pain improves with: rest, heat/ice, therapy/exercise and medication Relief from Meds: 2        Family History  Problem Relation Age of Onset   Atrial fibrillation Mother    Ulcers Father    Breast cancer  Maternal Aunt    Lung cancer Maternal Uncle    Stomach cancer Maternal Grandmother    Lung cancer Maternal Aunt    Brain cancer Maternal Aunt    Prostate cancer Maternal Grandfather    Stroke Paternal Grandmother    Tuberculosis Paternal Grandfather    Colon cancer Neg Hx    Esophageal cancer Neg Hx    Rectal cancer Neg Hx    Social History   Socioeconomic History   Marital status: Married    Spouse name: robert   Number of children: 3   Years of education: college   Highest education level: Not on file  Occupational History   Occupation: retired  Tobacco Use   Smoking status: Former    Packs/day: 0.50    Years: 35.00    Pack years: 17.50    Types: Cigarettes    Quit date: 05/22/2018    Years since quitting: 2.8   Smokeless tobacco: Never  Vaping Use   Vaping Use: Never used  Substance and Sexual Activity   Alcohol use: Yes    Comment: 1 glass of wine a night   Drug use: Not Currently    Comment: CBD tincture   Sexual activity: Yes    Birth control/protection: Post-menopausal  Other Topics Concern   Not on file  Social History Narrative   Lives with husband in a 2 story home.  Has 2 living children.  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  Education: college.    Right-handed.   Five cups coffee per week.   Social Determinants of Health   Financial Resource Strain: Low Risk    Difficulty of Paying Living Expenses: Not hard at all  Food Insecurity: No Food Insecurity   Worried About Charity fundraiser in the Last Year: Never true   [place] in the Last Year: Never true  Transportation Needs: No Transportation Needs   Lack of Transportation (Medical): No   Lack of Transportation (Non-Medical): No  Physical Activity: Inactive   Days of Exercise per Week: 0 days   Minutes of Exercise per Session: 0 min  Stress: Stress Concern Present   Feeling of Stress : To some extent  Social Connections: Not on file   Past Surgical History:  Procedure  Laterality Date   ABDOMINAL HYSTERECTOMY  1985   BASAL CELL CARCINOMA EXCISION  10/15   nose   BREAST BIOPSY Right 08/24/2015   BREAST BIOPSY Right 07/13/2015   BREAST BIOPSY Left 07/13/2015   BREAST EXCISIONAL BIOPSY Left 1972   BREAST LUMPECTOMY Left    BREAST LUMPECTOMY WITH RADIOACTIVE SEED AND SENTINEL LYMPH NODE BIOPSY Left 09/16/2015   Procedure: BREAST LUMPECTOMY WITH RADIOACTIVE SEED AND SENTINEL LYMPH NODE BIOPSY;  Surgeon: Stark Klein, MD;  Location: [facility];  Service: General;  Laterality: Left;   BREAST REDUCTION SURGERY Bilateral 1998   CATARACT EXTRACTION W/  INTRAOCULAR LENS IMPLANT Left 2016   CERVICAL SPINE SURGERY  1995   hallo   COLONOSCOPY  01/2013   polyps   EYE SURGERY Left 2016   HAND SURGERY Left 02-19-2002   dr Fredna Dow  @[facility]    repair collateral ligament/ MPJ left thumb   HARDWARE REMOVAL N/A 07/24/2019   Procedure: REVISION OF HARDWARE Lumbar two - Sacral one. Extention of Fusion to Lumbar one;  Surgeon: [name], MD;  Location: [facility];  Service: Neurosurgery;  Laterality: N/A;   HEMORRHOID SURGERY  2008   HIGH RESOLUTION ANOSCOPY N/A 02/28/2018   Procedure: HIGH RESOLUTION ANOSCOPY WITH BIOPSY;  Surgeon: Leighton Ruff, MD;  Location: [facility];  Service: General;  Laterality: N/A;   KNEE ARTHROSCOPY Right 2002   LUMBAR SPINE SURGERY  03-20-2015   dr Kathyrn Sheriff   fusion L4-5, L5-S1   MOUTH SURGERY  07/14/2018   2 infected teeth removed   MULTIPLE TOOTH EXTRACTIONS     with bone grafting lower bottom right and left   REDUCTION MAMMAPLASTY Bilateral    RIGHT COLECTOMY  09-10-2003   dr Margot Chimes @[facility]    multiple colon polyps (per path tubular adenoma's, hyperplastic , benign appendix, benign two lymph nodes   ROTATOR CUFF REPAIR Left 04/2016   TONSILLECTOMY  1969   TUBAL LIGATION Bilateral 1982   Past Medical History:  Diagnosis Date   AIN (anal intraepithelial neoplasia) anal canal    Anemia    with pregnancy    Anxiety    Arthritis    knees, lower back, knees   Chronic constipation    Chronic diastolic CHF (congestive heart failure) ([place]) 07/05/2019   CKD (chronic kidney disease)    Coarse tremors    Essential   COPD (chronic obstructive pulmonary disease) ([place])    2017 chest CT, history of   Degenerative lumbar spinal stenosis    Depression    Depression 07/05/2019   Drug dependency ([place])    Elevated PTHrP level 05/09/2019   ETOH abuse 07/05/2019   GERD (gastroesophageal reflux disease)    pt denies   Gout    Grade I diastolic dysfunction 92/02/9416   Noted ECHO   Hemorrhoids    History of adenomatous polyp of colon    History of basal cell carcinoma (BCC) excision 2015   nose   History of sepsis 05/14/2014   post lumbar surgery   History of shingles    x2   Hyperlipidemia    Hypertension    Hypothyroidism    Irregular heart beat    history of while undergoing radiation   Malignant neoplasm of upper-inner quadrant of left breast in female, estrogen receptor positive [facility]) oncologist-  dr Jana Hakim--  per lov in epic no recurrence   dx 07-13-2015  Left breast invasive ductal carcinoma, Stage IA, Grade 1 (TXN0),  09-16-2015  s/p  left breast lumpectomy with sln bx's,  completed radiation 11-18-2015,  started antiestrogen therapy 12-14-2015   Neuropathy    Left foot and back of left leg   Obesity (BMI 30-39.9) 07/05/2019   Personal history of radiation therapy    completed 11-18-2015  left breast   Pneumonia    PONV (postoperative nausea and vomiting)    Prediabetes    diet controlled, no med   Restless leg syndrome    Seasonal allergies    Seizure ([place]) 05/2015   due to sepsis, only one time episode   Tremor    Wears partial dentures    lower  There were no vitals taken for this visit.  Opioid Risk Score:   Fall Risk Score:  `1  Depression screen PHQ 2/9  Depression screen [facility] 2/9 02/26/2021 08/07/2020 08/05/2020 08/04/2020 06/10/2020 02/20/2020 01/23/2020  Decreased  Interest 0 0 0 0 0 0 0  Down, Depressed, Hopeless 0 0 0 0 0 0 0  PHQ - 2 Score 0 0 0 0 0 0 0  Altered sleeping - - - - - - -  Tired, decreased energy - - - - - - -  Change in appetite - - - - - - -  Feeling bad or failure about yourself  - - - - - - -  Trouble concentrating - - - - - - -  Moving slowly or fidgety/restless - - - - - - -  Suicidal thoughts - - - - - - -  PHQ-9 Score - - - - - - -  Difficult doing work/chores - - - - - - -  Some recent data might be hidden    Review of Systems  Constitutional: Negative.   HENT: Negative.    Eyes: Negative.   Respiratory: Negative.    Cardiovascular: Negative.   Gastrointestinal: Negative.   Endocrine: Negative.   Genitourinary: Negative.   Musculoskeletal:  Positive for gait problem.  Skin: Negative.   Allergic/Immunologic: Negative.   Neurological:  Positive for dizziness, tremors, weakness and numbness.       Tingling   Hematological: Negative.   Psychiatric/Behavioral: Negative.    All other systems reviewed and are negative.     Objective:   Physical Exam Gen: no distress, normal appearing HEENT: oral mucosa pink and moist, NCAT Cardio: Reg rate Chest: normal effort, normal rate of breathing Abd: soft, non-distended Ext: no edema Psych: pleasant, normal affect Skin: Onchomychosis Neuro: Alert and oriented x3  Assessment & Plan:  Brandi Stephens is a 75 year old female who presents for hospital f/u after CIR admission post back surgery, complicated by staph infection, now on chronic antibiotics (she is on a daily probiotic- encouraged this to protect her gut). Has had follow-up with neurosurgery.   1) Diarrhea: This was a huge issue previously and has since improved. I had obtained an XR that showed constipation and recommended an enema. Continue magnesium 1000mg  at night and colace BID. This continues but is improved.   2) Impaired mobility and ADLs following spinal surgery: -Continue HEP Progressing well. Increase  walking to 10 minutes per day -Recommended exercise bike at home to build back endurance. Continue walking outside as well as strength and gait are improving and she is no longer limited by diarrhea. -She has been having a lot of falls at home  3) Post-operative lower back pain: -Stopped oxycodone- was prescribed 1/2 pill hydrocodone by her her spine surgery.  -Continue to supplement with icing, Tyelnol, and Ibuprofen.  -recommended topamax recommended to clear with cardiology first  4) Peripheral neuropathy: -Discussed Sprint PNS system as an option of pain treatment via neuromodulation. Provided following link for patient to learn more about the system: https://www.sprtherapeutics.com/. She would like to try this. Will target sciatic nerve at the popliteal fossa.  -Refilled Amitriptyline to 75mg  HS PRN.  The patient has severe pain that has been shown to be refractory to conventional medical management with physical therapy, a number of pharmacologic options, minimally invasive procedures, and pain coping skills. Function is severely limited due to the pain and ability to perform self-care and activities of daily living is marginal.  The patient has responded partially to opiate medications. At this point, having failed most conservative therapies, I consider peripheral nerve stimulation to be medically necessary to reduce his pain, increase his functionality, and in order to avoid opiate dose escalation and the concomitant risks of tolerance, physical dependence, misuse, abuse, addiction, endocrine dysfunction, respiratory depression, and death.  -Discussed Qutenza as an option for neuropathic pain control. Discussed that this is a capsaicin patch, stronger than capsaicin cream. Discussed that it is currently approved for diabetic peripheral neuropathy and post-herpetic neuralgia, but that it has also shown benefit in treating other forms of neuropathy. Provided patient with link to site to learn more  about the patch: [employer].fr. Discussed that the patch would be placed in office and benefits usually last 3 months. Discussed that unintended exposure to capsaicin can cause severe irritation of eyes, mucous membranes, respiratory tract, and skin, but that Qutenza is a local treatment and does not have the systemic side effects of other nerve medications. Discussed that there may be pain, itching, erythema, and decreased sensory function associated with the application of Qutenza. Side effects usually subside within 1 week. A cold pack of analgesic medications can help with these side effects. Blood pressure can also be increased due to pain associated with administration of the patch.   4 patches of Qutenza was applied to the area of pain. Ice packs were applied during the procedure to ensure patient comfort. Blood pressure was monitored every 15 minutes. The patient tolerated the procedure well. Post-procedure instructions were given and follow-up has been scheduled.    Anixety: Refilled Diazepam PRN. Provided refill. Has been taking twice per day and this has been helping her. Also helps with her muscle spasms. This has been well controlled. She is sometimes upset that she is only comfortably horizontally.   Tachycardia: provided higher dose of metoprolol which she was on before last visit. HR is still slightly elevated today, but BP is low. Maintain current dose. She says BP was elevated at her doctor's appointment yesterday. It fluctuates.   Lower extremity edema: advised elevation, ice, and compression garments which she is already doing.  -continue Lasix -start lymphedema therapy  Hot flashes: from Anastrazole.   Home support: Living with husband. Daughter lives 5 miles away.   Bilateral knee pain: -continue voltaren gel and blue emu oil -recommended bacopa  Obesity -Educated that current weight is 263 lbs and current BMI is 39.99 -Educated regarding health benefits of  weight loss- for pain, general health, chronic disease prevention, immune health, mental health.  -Will monitor weight every visit.  -Consider Roobois tea daily.  -Discussed the benefits of intermittent fasting. -Discussed foods that can assist in weight loss: 1) leafy greens- high in fiber and nutrients 2) dark chocolate- improves metabolism (if prefer sweetened, best to sweeten with honey instead of sugar).  3) cruciferous vegetables- high in fiber and protein 4) full fat yogurt: high in healthy fat, protein, calcium, and probiotics 5) apples- high in a variety of phytochemicals 6) nuts- high in fiber and protein that increase feelings of fullness 7) grapefruit: rich in nutrients, antioxidants, and fiber (not to be taken with anticoagulation) 8) beans- high in protein and fiber 9) salmon- has high quality protein and healthy fats 10) green tea- rich in polyphenols 11) eggs- rich in choline and vitamin D 12) tuna- high protein, boosts metabolism 13) avocado- decreases visceral abdominal fat 14) chicken (pasture raised): high in protein and iron 15) blueberries- reduce abdominal fat and cholesterol 16)  whole grains- decreases calories retained during digestion, speeds metabolism 17) chia seeds- curb appetite 18) chilies- increases fat metabolism  -Discussed supplements that can be used:  1) Metatrim 400mg  BID 30 minutes before breakfast and dinner  2) Sphaeranthus indicus and Garcinia mangostana (combinations of these and #1 can be found in capsicum and zychrome  3) green coffee bean extract 400mg  twice per day or Irvingia (african mango) 150 to 300mg  twice per day.

## 2021-03-24 ENCOUNTER — Telehealth: Payer: Self-pay

## 2021-03-24 DIAGNOSIS — G250 Essential tremor: Secondary | ICD-10-CM | POA: Diagnosis not present

## 2021-03-24 DIAGNOSIS — G2581 Restless legs syndrome: Secondary | ICD-10-CM | POA: Diagnosis not present

## 2021-03-24 DIAGNOSIS — J9601 Acute respiratory failure with hypoxia: Secondary | ICD-10-CM | POA: Diagnosis not present

## 2021-03-24 DIAGNOSIS — M48061 Spinal stenosis, lumbar region without neurogenic claudication: Secondary | ICD-10-CM | POA: Diagnosis not present

## 2021-03-24 DIAGNOSIS — Z792 Long term (current) use of antibiotics: Secondary | ICD-10-CM | POA: Diagnosis not present

## 2021-03-24 DIAGNOSIS — E1142 Type 2 diabetes mellitus with diabetic polyneuropathy: Secondary | ICD-10-CM | POA: Diagnosis not present

## 2021-03-24 DIAGNOSIS — G8929 Other chronic pain: Secondary | ICD-10-CM | POA: Diagnosis not present

## 2021-03-24 DIAGNOSIS — I130 Hypertensive heart and chronic kidney disease with heart failure and stage 1 through stage 4 chronic kidney disease, or unspecified chronic kidney disease: Secondary | ICD-10-CM | POA: Diagnosis not present

## 2021-03-24 DIAGNOSIS — E1122 Type 2 diabetes mellitus with diabetic chronic kidney disease: Secondary | ICD-10-CM | POA: Diagnosis not present

## 2021-03-24 DIAGNOSIS — M47816 Spondylosis without myelopathy or radiculopathy, lumbar region: Secondary | ICD-10-CM | POA: Diagnosis not present

## 2021-03-24 DIAGNOSIS — Z6841 Body Mass Index (BMI) 40.0 and over, adult: Secondary | ICD-10-CM | POA: Diagnosis not present

## 2021-03-24 DIAGNOSIS — N1831 Chronic kidney disease, stage 3a: Secondary | ICD-10-CM | POA: Diagnosis not present

## 2021-03-24 DIAGNOSIS — E039 Hypothyroidism, unspecified: Secondary | ICD-10-CM | POA: Diagnosis not present

## 2021-03-24 DIAGNOSIS — K6282 Dysplasia of anus: Secondary | ICD-10-CM | POA: Diagnosis not present

## 2021-03-24 DIAGNOSIS — F418 Other specified anxiety disorders: Secondary | ICD-10-CM | POA: Diagnosis not present

## 2021-03-24 DIAGNOSIS — K5909 Other constipation: Secondary | ICD-10-CM | POA: Diagnosis not present

## 2021-03-24 DIAGNOSIS — M170 Bilateral primary osteoarthritis of knee: Secondary | ICD-10-CM | POA: Diagnosis not present

## 2021-03-24 DIAGNOSIS — I872 Venous insufficiency (chronic) (peripheral): Secondary | ICD-10-CM | POA: Diagnosis not present

## 2021-03-24 DIAGNOSIS — J439 Emphysema, unspecified: Secondary | ICD-10-CM | POA: Diagnosis not present

## 2021-03-24 DIAGNOSIS — E1151 Type 2 diabetes mellitus with diabetic peripheral angiopathy without gangrene: Secondary | ICD-10-CM | POA: Diagnosis not present

## 2021-03-24 DIAGNOSIS — M109 Gout, unspecified: Secondary | ICD-10-CM | POA: Diagnosis not present

## 2021-03-24 DIAGNOSIS — I5032 Chronic diastolic (congestive) heart failure: Secondary | ICD-10-CM | POA: Diagnosis not present

## 2021-03-24 DIAGNOSIS — K219 Gastro-esophageal reflux disease without esophagitis: Secondary | ICD-10-CM | POA: Diagnosis not present

## 2021-03-24 DIAGNOSIS — E785 Hyperlipidemia, unspecified: Secondary | ICD-10-CM | POA: Diagnosis not present

## 2021-03-24 NOTE — Telephone Encounter (Signed)
Brandi Stephens from [facility] called stating that she was going to renew her home health for another 60 days. She also wanted to let you know Brandi Stephens is going to start taking her trulicity on Wednesday now instead of Monday. She also tried to give it to her self last time and she did not leave it in long enough so she did not get the full dose.

## 2021-03-25 ENCOUNTER — Telehealth: Payer: Self-pay | Admitting: Medical

## 2021-03-25 DIAGNOSIS — F418 Other specified anxiety disorders: Secondary | ICD-10-CM | POA: Diagnosis not present

## 2021-03-25 DIAGNOSIS — I130 Hypertensive heart and chronic kidney disease with heart failure and stage 1 through stage 4 chronic kidney disease, or unspecified chronic kidney disease: Secondary | ICD-10-CM | POA: Diagnosis not present

## 2021-03-25 DIAGNOSIS — E1142 Type 2 diabetes mellitus with diabetic polyneuropathy: Secondary | ICD-10-CM | POA: Diagnosis not present

## 2021-03-25 DIAGNOSIS — E785 Hyperlipidemia, unspecified: Secondary | ICD-10-CM | POA: Diagnosis not present

## 2021-03-25 DIAGNOSIS — G2581 Restless legs syndrome: Secondary | ICD-10-CM | POA: Diagnosis not present

## 2021-03-25 DIAGNOSIS — I5032 Chronic diastolic (congestive) heart failure: Secondary | ICD-10-CM | POA: Diagnosis not present

## 2021-03-25 DIAGNOSIS — J9601 Acute respiratory failure with hypoxia: Secondary | ICD-10-CM | POA: Diagnosis not present

## 2021-03-25 DIAGNOSIS — E039 Hypothyroidism, unspecified: Secondary | ICD-10-CM | POA: Diagnosis not present

## 2021-03-25 DIAGNOSIS — N1831 Chronic kidney disease, stage 3a: Secondary | ICD-10-CM | POA: Diagnosis not present

## 2021-03-25 DIAGNOSIS — E1122 Type 2 diabetes mellitus with diabetic chronic kidney disease: Secondary | ICD-10-CM | POA: Diagnosis not present

## 2021-03-25 DIAGNOSIS — M47816 Spondylosis without myelopathy or radiculopathy, lumbar region: Secondary | ICD-10-CM | POA: Diagnosis not present

## 2021-03-25 DIAGNOSIS — K5909 Other constipation: Secondary | ICD-10-CM | POA: Diagnosis not present

## 2021-03-25 DIAGNOSIS — G250 Essential tremor: Secondary | ICD-10-CM | POA: Diagnosis not present

## 2021-03-25 DIAGNOSIS — E1151 Type 2 diabetes mellitus with diabetic peripheral angiopathy without gangrene: Secondary | ICD-10-CM | POA: Diagnosis not present

## 2021-03-25 DIAGNOSIS — G8929 Other chronic pain: Secondary | ICD-10-CM | POA: Diagnosis not present

## 2021-03-25 DIAGNOSIS — M109 Gout, unspecified: Secondary | ICD-10-CM | POA: Diagnosis not present

## 2021-03-25 DIAGNOSIS — M170 Bilateral primary osteoarthritis of knee: Secondary | ICD-10-CM | POA: Diagnosis not present

## 2021-03-25 DIAGNOSIS — J439 Emphysema, unspecified: Secondary | ICD-10-CM | POA: Diagnosis not present

## 2021-03-25 DIAGNOSIS — Z6841 Body Mass Index (BMI) 40.0 and over, adult: Secondary | ICD-10-CM | POA: Diagnosis not present

## 2021-03-25 DIAGNOSIS — Z792 Long term (current) use of antibiotics: Secondary | ICD-10-CM | POA: Diagnosis not present

## 2021-03-25 DIAGNOSIS — I872 Venous insufficiency (chronic) (peripheral): Secondary | ICD-10-CM | POA: Diagnosis not present

## 2021-03-25 DIAGNOSIS — K6282 Dysplasia of anus: Secondary | ICD-10-CM | POA: Diagnosis not present

## 2021-03-25 DIAGNOSIS — M48061 Spinal stenosis, lumbar region without neurogenic claudication: Secondary | ICD-10-CM | POA: Diagnosis not present

## 2021-03-25 DIAGNOSIS — K219 Gastro-esophageal reflux disease without esophagitis: Secondary | ICD-10-CM | POA: Diagnosis not present

## 2021-03-25 NOTE — Telephone Encounter (Signed)
Niota from [facility] called requesting verbal orders to contiune pt 2 times a week for 1  1 time a week for 1 week 2 times a week for 1 week She can be reached at [phone]

## 2021-03-26 ENCOUNTER — Other Ambulatory Visit: Payer: Self-pay

## 2021-03-26 ENCOUNTER — Encounter: Payer: PPO | Attending: Physical Medicine and Rehabilitation | Admitting: Physical Medicine and Rehabilitation

## 2021-03-26 VITALS — BP 123/75 | HR 110 | Temp 98.3°F | Ht 68.0 in | Wt 263.0 lb

## 2021-03-26 DIAGNOSIS — R600 Localized edema: Secondary | ICD-10-CM | POA: Insufficient documentation

## 2021-03-26 DIAGNOSIS — M5416 Radiculopathy, lumbar region: Secondary | ICD-10-CM | POA: Insufficient documentation

## 2021-03-26 DIAGNOSIS — Z6839 Body mass index (BMI) 39.0-39.9, adult: Secondary | ICD-10-CM | POA: Diagnosis not present

## 2021-03-26 DIAGNOSIS — G603 Idiopathic progressive neuropathy: Secondary | ICD-10-CM | POA: Diagnosis not present

## 2021-03-26 NOTE — Patient Instructions (Addendum)
Tomapax   Weight loss: -Consider Roobois tea daily.  1) leafy greens- high in fiber and nutrients 2) dark chocolate- improves metabolism (if prefer sweetened, best to sweeten with honey instead of sugar).  3) cruciferous vegetables- high in fiber and protein 4) full fat yogurt: high in healthy fat, protein, calcium, and probiotics 5) apples- high in a variety of phytochemicals 6) nuts- high in fiber and protein that increase feelings of fullness 7) grapefruit: rich in nutrients, antioxidants, and fiber (not to be taken with anticoagulation) 8) beans- high in protein and fiber 9) salmon- has high quality protein and healthy fats 10) green tea- rich in polyphenols 11) eggs- rich in choline and vitamin D 12) tuna- high protein, boosts metabolism 13) avocado- decreases visceral abdominal fat 14) chicken (pasture raised): high in protein and iron 15) blueberries- reduce abdominal fat and cholesterol 16) whole grains- decreases calories retained during digestion, speeds metabolism 17) chia seeds- curb appetite 18) chilies- increases fat metabolism  -Discussed supplements that can be used:  1) Metatrim 400mg  BID 30 minutes before breakfast and dinner  2) Sphaeranthus indicus and Garcinia mangostana (combinations of these and #1 can be found in capsicum and zychrome  3) green coffee bean extract 400mg  twice per day or Irvingia (african mango) 150 to 300mg  twice per day.

## 2021-03-29 ENCOUNTER — Encounter (HOSPITAL_BASED_OUTPATIENT_CLINIC_OR_DEPARTMENT_OTHER): Payer: PPO | Admitting: Physical Medicine and Rehabilitation

## 2021-03-29 ENCOUNTER — Other Ambulatory Visit: Payer: Self-pay

## 2021-03-29 DIAGNOSIS — Z6841 Body Mass Index (BMI) 40.0 and over, adult: Secondary | ICD-10-CM | POA: Diagnosis not present

## 2021-03-29 DIAGNOSIS — Z792 Long term (current) use of antibiotics: Secondary | ICD-10-CM | POA: Diagnosis not present

## 2021-03-29 DIAGNOSIS — M47816 Spondylosis without myelopathy or radiculopathy, lumbar region: Secondary | ICD-10-CM | POA: Diagnosis not present

## 2021-03-29 DIAGNOSIS — E039 Hypothyroidism, unspecified: Secondary | ICD-10-CM | POA: Diagnosis not present

## 2021-03-29 DIAGNOSIS — I130 Hypertensive heart and chronic kidney disease with heart failure and stage 1 through stage 4 chronic kidney disease, or unspecified chronic kidney disease: Secondary | ICD-10-CM | POA: Diagnosis not present

## 2021-03-29 DIAGNOSIS — K6282 Dysplasia of anus: Secondary | ICD-10-CM | POA: Diagnosis not present

## 2021-03-29 DIAGNOSIS — E1151 Type 2 diabetes mellitus with diabetic peripheral angiopathy without gangrene: Secondary | ICD-10-CM | POA: Diagnosis not present

## 2021-03-29 DIAGNOSIS — N1831 Chronic kidney disease, stage 3a: Secondary | ICD-10-CM | POA: Diagnosis not present

## 2021-03-29 DIAGNOSIS — J9601 Acute respiratory failure with hypoxia: Secondary | ICD-10-CM | POA: Diagnosis not present

## 2021-03-29 DIAGNOSIS — G8929 Other chronic pain: Secondary | ICD-10-CM | POA: Diagnosis not present

## 2021-03-29 DIAGNOSIS — E1122 Type 2 diabetes mellitus with diabetic chronic kidney disease: Secondary | ICD-10-CM | POA: Diagnosis not present

## 2021-03-29 DIAGNOSIS — G250 Essential tremor: Secondary | ICD-10-CM | POA: Diagnosis not present

## 2021-03-29 DIAGNOSIS — K5909 Other constipation: Secondary | ICD-10-CM | POA: Diagnosis not present

## 2021-03-29 DIAGNOSIS — M48061 Spinal stenosis, lumbar region without neurogenic claudication: Secondary | ICD-10-CM | POA: Diagnosis not present

## 2021-03-29 DIAGNOSIS — M109 Gout, unspecified: Secondary | ICD-10-CM | POA: Diagnosis not present

## 2021-03-29 DIAGNOSIS — G603 Idiopathic progressive neuropathy: Secondary | ICD-10-CM

## 2021-03-29 DIAGNOSIS — I872 Venous insufficiency (chronic) (peripheral): Secondary | ICD-10-CM | POA: Diagnosis not present

## 2021-03-29 DIAGNOSIS — M170 Bilateral primary osteoarthritis of knee: Secondary | ICD-10-CM | POA: Diagnosis not present

## 2021-03-29 DIAGNOSIS — E1142 Type 2 diabetes mellitus with diabetic polyneuropathy: Secondary | ICD-10-CM | POA: Diagnosis not present

## 2021-03-29 DIAGNOSIS — G2581 Restless legs syndrome: Secondary | ICD-10-CM | POA: Diagnosis not present

## 2021-03-29 DIAGNOSIS — R600 Localized edema: Secondary | ICD-10-CM | POA: Diagnosis not present

## 2021-03-29 DIAGNOSIS — E785 Hyperlipidemia, unspecified: Secondary | ICD-10-CM | POA: Diagnosis not present

## 2021-03-29 DIAGNOSIS — J439 Emphysema, unspecified: Secondary | ICD-10-CM | POA: Diagnosis not present

## 2021-03-29 DIAGNOSIS — F418 Other specified anxiety disorders: Secondary | ICD-10-CM | POA: Diagnosis not present

## 2021-03-29 DIAGNOSIS — K219 Gastro-esophageal reflux disease without esophagitis: Secondary | ICD-10-CM | POA: Diagnosis not present

## 2021-03-29 DIAGNOSIS — I5032 Chronic diastolic (congestive) heart failure: Secondary | ICD-10-CM | POA: Diagnosis not present

## 2021-03-29 MED ORDER — TOPIRAMATE 25 MG PO TABS: 25.0000 mg | ORAL_TABLET | Freq: Every evening | ORAL | 11 refills | Status: DC

## 2021-03-29 NOTE — Progress Notes (Signed)
Subjective:    Patient ID: Brandi Stephens, female    DOB: 11/20/1945, 75 y.o.   MRN: [id number]  An audio/video tele-health visit is felt to be the most appropriate encounter for this patient at this time.  This is a follow up tele-visit via phone. The patient is at home. MD is at office.     Mrs. Mcclary presents for f/u after CIR admission for staph bacteremia after infection of her spinal hardware, and for neuropathy. Everything is the same since last visit. She is having more trouble with neuropathy in her feet and hands. The Cymbalta 60mg  does help somewhat. She used to get swelling with Gabapentin. The pain is bad during the day and night. Pain is improved to 4. She cannot feel her two little toes. Discussed Lyrica as an option. She would like try the Sprint PNS. Her back pain is the most severe. She has hardware. She follows with Dr. Bridget Hartshorn at the end of the month. The pain in her back feels like a dagger. The pain is more directly on the spine.   The Amitriptyline helps her to sleep a little better. She has improved sleep from 1 to 4 hours. She is having severe neuropathy in her left foot. The Cymbalta is helping with the neuropathic pain in her hands. She has been tolerating this medication well.   Last visit I transitioned her to outaptient therapy and she has an appointment today. She has noted improvements in her strength. She is ambulating steadily with RW today. She has bene trying using a cane with therapy. Outpatient therapy is doing well. She has been taking 1/2 a tablet of hydrocodone to help her tolerate better (prescribed by her surgeon).   Pain is currently 5/10 and worst in her lower back where her spinal harware is present as well as in paraspinals. No longer acquiring She has been taking approximately 1 percocet per day and would like to gradually stop using these. She has also been using four 650mg  of Tylenol per day and two 200mg  ibuprofen daily.   She no longer has severe  diarrhea or constipation and is much happier about this. It does come and go. She takes 1000mg  magensium at night. She takes colace BID. She likes to eat healthy foods.   Her daughter accompanies her this visit and helps to provide history. She and her daughter have been out of work for 5 years due to her health conditions and need for her daughter's assistance. Her grandchild recently graduated kindergarten.   1) B/l lower extremity edema She continues to have mild bilateral lower extremity edema.  Discussed use of compression garments, elevation, and ice.  Discussed the benefits of lymphedema therapy and she is willing to try this.  -She has been having pooling of fluids throughout her body -She has never seen nephrology but has followed with cardiology -Lasix administration is limited due to her creatinine.  -improved after Qutenza administration on Friday  2) Bilateral knees: -she has fallen on her knees so many times.  -she has been using blue emu oil. -she has been using voltaren gel.  -she has a history of arthroscopic surgery and steroid injections -she uses turmeric  3) Muscle spasms -the Valium does help  4) Peripheral neuropathy: -60mg  Cymbalta is helping. -improved after administration of Qutenza on Friday -she did have some burning on ankle and shin after Qutenza and this has improved after she used the cooling gel -she loves the pain journal and keeps  it by her -asks about the topamax -it is really affecting her quality of life  5) Prediabetes -has been eating very healthy foods  6) Anxiety: -they Valium helps- she uses as needed.  -she appreciates her husband's and daughter's care.   5) Dizziness -feels related to going out, anxiety.  6) obesity -has been trying to lose weight     Pain Inventory Average Pain 4 Pain Right Now 2 My pain is constant, sharp, burning, stabbing and aching  In the last 24 hours, has pain interfered with the  following? General activity 4 Relation with others 0 Enjoyment of life 5 What TIME of day is your pain at its worst?  varies Sleep (in general) Poor  Pain is worse with: walking, bending, sitting and standing Pain improves with: rest, heat/ice, therapy/exercise and medication Relief from Meds: 2        Family History  Problem Relation Age of Onset   Atrial fibrillation Mother    Ulcers Father    Breast cancer Maternal Aunt    Lung cancer Maternal Uncle    Stomach cancer Maternal Grandmother    Lung cancer Maternal Aunt    Brain cancer Maternal Aunt    Prostate cancer Maternal Grandfather    Stroke Paternal Grandmother    Tuberculosis Paternal Grandfather    Colon cancer Neg Hx    Esophageal cancer Neg Hx    Rectal cancer Neg Hx    Social History   Socioeconomic History   Marital status: Married    Spouse name: robert   Number of children: 3   Years of education: college   Highest education level: Not on file  Occupational History   Occupation: retired  Tobacco Use   Smoking status: Former    Packs/day: 0.50    Years: 35.00    Pack years: 17.50    Types: Cigarettes    Quit date: 05/22/2018    Years since quitting: 2.8   Smokeless tobacco: Never  Vaping Use   Vaping Use: Never used  Substance and Sexual Activity   Alcohol use: Yes    Comment: 1 glass of wine a night   Drug use: Not Currently    Comment: CBD tincture   Sexual activity: Yes    Birth control/protection: Post-menopausal  Other Topics Concern   Not on file  Social History Narrative   Lives with husband in a 2 story home.  Has 2 living children.  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  Education: college.    Right-handed.   Five cups coffee per week.   Social Determinants of Health   Financial Resource Strain: Low Risk    Difficulty of Paying Living Expenses: Not hard at all  Food Insecurity: No Food Insecurity   Worried About Charity fundraiser in the Last Year: Never true    [place] in the Last Year: Never true  Transportation Needs: No Transportation Needs   Lack of Transportation (Medical): No   Lack of Transportation (Non-Medical): No  Physical Activity: Inactive   Days of Exercise per Week: 0 days   Minutes of Exercise per Session: 0 min  Stress: Stress Concern Present   Feeling of Stress : To some extent  Social Connections: Not on file   Past Surgical History:  Procedure Laterality Date   ABDOMINAL HYSTERECTOMY  1985   BASAL CELL CARCINOMA EXCISION  10/15   nose   BREAST BIOPSY Right 08/24/2015   BREAST BIOPSY Right 07/13/2015  BREAST BIOPSY Left 07/13/2015   BREAST EXCISIONAL BIOPSY Left 1972   BREAST LUMPECTOMY Left    BREAST LUMPECTOMY WITH RADIOACTIVE SEED AND SENTINEL LYMPH NODE BIOPSY Left 09/16/2015   Procedure: BREAST LUMPECTOMY WITH RADIOACTIVE SEED AND SENTINEL LYMPH NODE BIOPSY;  Surgeon: Stark Klein, MD;  Location: [facility];  Service: General;  Laterality: Left;   BREAST REDUCTION SURGERY Bilateral 1998   CATARACT EXTRACTION W/ INTRAOCULAR LENS IMPLANT Left 2016   CERVICAL SPINE SURGERY  1995   hallo   COLONOSCOPY  01/2013   polyps   EYE SURGERY Left 2016   HAND SURGERY Left 02-19-2002   dr Fredna Dow  @[facility]    repair collateral ligament/ MPJ left thumb   HARDWARE REMOVAL N/A 07/24/2019   Procedure: REVISION OF HARDWARE Lumbar two - Sacral one. Extention of Fusion to Lumbar one;  Surgeon: [name], MD;  Location: [facility];  Service: Neurosurgery;  Laterality: N/A;   HEMORRHOID SURGERY  2008   HIGH RESOLUTION ANOSCOPY N/A 02/28/2018   Procedure: HIGH RESOLUTION ANOSCOPY WITH BIOPSY;  Surgeon: Leighton Ruff, MD;  Location: [facility];  Service: General;  Laterality: N/A;   KNEE ARTHROSCOPY Right 2002   LUMBAR SPINE SURGERY  03-20-2015   dr Kathyrn Sheriff   fusion L4-5, L5-S1   MOUTH SURGERY  07/14/2018   2 infected teeth removed   MULTIPLE TOOTH EXTRACTIONS     with bone grafting lower bottom  right and left   REDUCTION MAMMAPLASTY Bilateral    RIGHT COLECTOMY  09-10-2003   dr Margot Chimes @[facility]    multiple colon polyps (per path tubular adenoma's, hyperplastic , benign appendix, benign two lymph nodes   ROTATOR CUFF REPAIR Left 04/2016   TONSILLECTOMY  1969   TUBAL LIGATION Bilateral 1982   Past Medical History:  Diagnosis Date   AIN (anal intraepithelial neoplasia) anal canal    Anemia    with pregnancy   Anxiety    Arthritis    knees, lower back, knees   Chronic constipation    Chronic diastolic CHF (congestive heart failure) ([place]) 07/05/2019   CKD (chronic kidney disease)    Coarse tremors    Essential   COPD (chronic obstructive pulmonary disease) ([place])    2017 chest CT, history of   Degenerative lumbar spinal stenosis    Depression    Depression 07/05/2019   Drug dependency ([place])    Elevated PTHrP level 05/09/2019   ETOH abuse 07/05/2019   GERD (gastroesophageal reflux disease)    pt denies   Gout    Grade I diastolic dysfunction 52/77/8242   Noted ECHO   Hemorrhoids    History of adenomatous polyp of colon    History of basal cell carcinoma (BCC) excision 2015   nose   History of sepsis 05/14/2014   post lumbar surgery   History of shingles    x2   Hyperlipidemia    Hypertension    Hypothyroidism    Irregular heart beat    history of while undergoing radiation   Malignant neoplasm of upper-inner quadrant of left breast in female, estrogen receptor positive [facility]) oncologist-  dr Jana Hakim--  per lov in epic no recurrence   dx 07-13-2015  Left breast invasive ductal carcinoma, Stage IA, Grade 1 (TXN0),  09-16-2015  s/p  left breast lumpectomy with sln bx's,  completed radiation 11-18-2015,  started antiestrogen therapy 12-14-2015   Neuropathy    Left foot and back of left leg   Obesity (BMI 30-39.9) 07/05/2019   Personal  history of radiation therapy    completed 11-18-2015  left breast   Pneumonia    PONV (postoperative nausea and vomiting)    Prediabetes     diet controlled, no med   Restless leg syndrome    Seasonal allergies    Seizure ([place]) 05/2015   due to sepsis, only one time episode   Tremor    Wears partial dentures    lower    There were no vitals taken for this visit.  Opioid Risk Score:   Fall Risk Score:  `1  Depression screen PHQ 2/9  Depression screen [facility] 2/9 02/26/2021 08/07/2020 08/05/2020 08/04/2020 06/10/2020 02/20/2020 01/23/2020  Decreased Interest 0 0 0 0 0 0 0  Down, Depressed, Hopeless 0 0 0 0 0 0 0  PHQ - 2 Score 0 0 0 0 0 0 0  Altered sleeping - - - - - - -  Tired, decreased energy - - - - - - -  Change in appetite - - - - - - -  Feeling bad or failure about yourself  - - - - - - -  Trouble concentrating - - - - - - -  Moving slowly or fidgety/restless - - - - - - -  Suicidal thoughts - - - - - - -  PHQ-9 Score - - - - - - -  Difficult doing work/chores - - - - - - -  Some recent data might be hidden    Review of Systems  Constitutional: Negative.   HENT: Negative.    Eyes: Negative.   Respiratory: Negative.    Cardiovascular: Negative.   Gastrointestinal: Negative.   Endocrine: Negative.   Genitourinary: Negative.   Musculoskeletal:  Positive for gait problem.  Skin: Negative.   Allergic/Immunologic: Negative.   Neurological:  Positive for dizziness, tremors, weakness and numbness.       Tingling   Hematological: Negative.   Psychiatric/Behavioral: Negative.    All other systems reviewed and are negative.     Objective:   Physical Exam Not performed as she was seen via phone.   Assessment & Plan:  Mrs. Shelvin is a 75 year old female who presents for f/u of neuropathy and below conditions.   1) Diarrhea: This was a huge issue previously and has since improved. I had obtained an XR that showed constipation and recommended an enema. Continue magnesium 1000mg  at night and colace BID. This continues but is improved.   2) Impaired mobility and ADLs following spinal surgery: -Continue HEP  Progressing well. Increase walking to 10 minutes per day -Recommended exercise bike at home to build back endurance. Continue walking outside as well as strength and gait are improving and she is no longer limited by diarrhea. -She has been having a lot of falls at home  3) Post-operative lower back pain: -Stopped oxycodone- was prescribed 1/2 pill hydrocodone by her her spine surgery.  -Continue to supplement with icing, Tyelnol, and Ibuprofen.  -recommended topamax recommended to clear with cardiology first  4) Peripheral neuropathy: -Discussed Sprint PNS system as an option of pain treatment via neuromodulation. Provided following link for patient to learn more about the system: https://www.sprtherapeutics.com/. She would like to try this. Will target sciatic nerve at the popliteal fossa.  -Refilled Amitriptyline to 75mg  HS PRN.  The patient has severe pain that has been shown to be refractory to conventional medical management with physical therapy, a number of pharmacologic options, minimally invasive procedures, and pain coping skills. Function is  severely limited due to the pain and ability to perform self-care and activities of daily living is marginal. The patient has responded partially to opiate medications. At this point, having failed most conservative therapies, I consider peripheral nerve stimulation to be medically necessary to reduce his pain, increase his functionality, and in order to avoid opiate dose escalation and the concomitant risks of tolerance, physical dependence, misuse, abuse, addiction, endocrine dysfunction, respiratory depression, and death.  -Discussed Qutenza as an option for neuropathic pain control. Discussed that this is a capsaicin patch, stronger than capsaicin cream. Discussed that it is currently approved for diabetic peripheral neuropathy and post-herpetic neuralgia, but that it has also shown benefit in treating other forms of neuropathy. Provided patient with  link to site to learn more about the patch: [employer].fr. Discussed that the patch would be placed in office and benefits usually last 3 months. Discussed that unintended exposure to capsaicin can cause severe irritation of eyes, mucous membranes, respiratory tract, and skin, but that Qutenza is a local treatment and does not have the systemic side effects of other nerve medications. Discussed that there may be pain, itching, erythema, and decreased sensory function associated with the application of Qutenza. Side effects usually subside within 1 week. A cold pack of analgesic medications can help with these side effects. Blood pressure can also be increased due to pain associated with administration of the patch.   4 patches of Qutenza was applied to the area of pain on Friday. Ice packs were applied during the procedure to ensure patient comfort. Blood pressure was monitored every 15 minutes. The patient tolerated the procedure well. Post-procedure instructions were given and follow-up has been scheduled.    Great benefit- schedule for the rest of the year every 3 months Continue keeping pain journal Prescribed topamax 25mg  HS  Anixety: Refilled Diazepam PRN. Provided refill. Has been taking twice per day and this has been helping her. Also helps with her muscle spasms. This has been well controlled. She is sometimes upset that she is only comfortably horizontally.   Tachycardia: provided higher dose of metoprolol which she was on before last visit. HR is still slightly elevated today, but BP is low. Maintain current dose. She says BP was elevated at her doctor's appointment yesterday. It fluctuates.   Lower extremity edema: advised elevation, ice, and compression garments which she is already doing.  -continue Lasix -start lymphedema therapy -continue Qutenza q39months  Hot flashes: from Anastrazole.   Home support: Living with husband. Daughter lives 5 miles away.   Bilateral knee  pain: -continue voltaren gel and blue emu oil -recommended bacopa  Obesity -Educated that current weight is 263 lbs and current BMI is 39.99 -Educated regarding health benefits of weight loss- for pain, general health, chronic disease prevention, immune health, mental health.  -Will monitor weight every visit.  -Consider Ro[place] tea daily.  -Discussed the benefits of intermittent fasting. -Discussed foods that can assist in weight loss: 1) leafy greens- high in fiber and nutrients 2) dark chocolate- improves metabolism (if prefer sweetened, best to sweeten with honey instead of sugar).  3) cruciferous vegetables- high in fiber and protein 4) full fat yogurt: high in healthy fat, protein, calcium, and probiotics 5) apples- high in a variety of phytochemicals 6) nuts- high in fiber and protein that increase feelings of fullness 7) grapefruit: rich in nutrients, antioxidants, and fiber (not to be taken with anticoagulation) 8) beans- high in protein and fiber 9) salmon- has high quality protein and healthy  fats 10) green tea- rich in polyphenols 11) eggs- rich in choline and vitamin D 12) tuna- high protein, boosts metabolism 13) avocado- decreases visceral abdominal fat 14) chicken (pasture raised): high in protein and iron 15) blueberries- reduce abdominal fat and cholesterol 16) whole grains- decreases calories retained during digestion, speeds metabolism 17) chia seeds- curb appetite 18) chilies- increases fat metabolism  -Discussed supplements that can be used:  1) Metatrim 400mg  BID 30 minutes before breakfast and dinner  2) Sphaeranthus indicus and Garcinia mangostana (combinations of these and #1 can be found in capsicum and zychrome  3) green coffee bean extract 400mg  twice per day or Irvingia (african mango) 150 to 300mg  twice per day.  5 minutes spent in discussion of her response to Qutenza in both reduced pain and swelling, discussion of topamax for pain, insomnia, and to  curb appetite.

## 2021-03-30 DIAGNOSIS — G250 Essential tremor: Secondary | ICD-10-CM | POA: Diagnosis not present

## 2021-03-30 DIAGNOSIS — G8929 Other chronic pain: Secondary | ICD-10-CM | POA: Diagnosis not present

## 2021-03-30 DIAGNOSIS — M170 Bilateral primary osteoarthritis of knee: Secondary | ICD-10-CM | POA: Diagnosis not present

## 2021-03-30 DIAGNOSIS — K219 Gastro-esophageal reflux disease without esophagitis: Secondary | ICD-10-CM | POA: Diagnosis not present

## 2021-03-30 DIAGNOSIS — F418 Other specified anxiety disorders: Secondary | ICD-10-CM | POA: Diagnosis not present

## 2021-03-30 DIAGNOSIS — K5909 Other constipation: Secondary | ICD-10-CM | POA: Diagnosis not present

## 2021-03-30 DIAGNOSIS — E1142 Type 2 diabetes mellitus with diabetic polyneuropathy: Secondary | ICD-10-CM | POA: Diagnosis not present

## 2021-03-30 DIAGNOSIS — E785 Hyperlipidemia, unspecified: Secondary | ICD-10-CM | POA: Diagnosis not present

## 2021-03-30 DIAGNOSIS — I872 Venous insufficiency (chronic) (peripheral): Secondary | ICD-10-CM | POA: Diagnosis not present

## 2021-03-30 DIAGNOSIS — N1831 Chronic kidney disease, stage 3a: Secondary | ICD-10-CM | POA: Diagnosis not present

## 2021-03-30 DIAGNOSIS — J9601 Acute respiratory failure with hypoxia: Secondary | ICD-10-CM | POA: Diagnosis not present

## 2021-03-30 DIAGNOSIS — I130 Hypertensive heart and chronic kidney disease with heart failure and stage 1 through stage 4 chronic kidney disease, or unspecified chronic kidney disease: Secondary | ICD-10-CM | POA: Diagnosis not present

## 2021-03-30 DIAGNOSIS — I5032 Chronic diastolic (congestive) heart failure: Secondary | ICD-10-CM | POA: Diagnosis not present

## 2021-03-30 DIAGNOSIS — K6282 Dysplasia of anus: Secondary | ICD-10-CM | POA: Diagnosis not present

## 2021-03-30 DIAGNOSIS — Z87891 Personal history of nicotine dependence: Secondary | ICD-10-CM | POA: Diagnosis not present

## 2021-03-30 DIAGNOSIS — Z9981 Dependence on supplemental oxygen: Secondary | ICD-10-CM | POA: Diagnosis not present

## 2021-03-30 DIAGNOSIS — E1122 Type 2 diabetes mellitus with diabetic chronic kidney disease: Secondary | ICD-10-CM | POA: Diagnosis not present

## 2021-03-30 DIAGNOSIS — M47816 Spondylosis without myelopathy or radiculopathy, lumbar region: Secondary | ICD-10-CM | POA: Diagnosis not present

## 2021-03-30 DIAGNOSIS — E1151 Type 2 diabetes mellitus with diabetic peripheral angiopathy without gangrene: Secondary | ICD-10-CM | POA: Diagnosis not present

## 2021-03-30 DIAGNOSIS — M48061 Spinal stenosis, lumbar region without neurogenic claudication: Secondary | ICD-10-CM | POA: Diagnosis not present

## 2021-03-30 DIAGNOSIS — E039 Hypothyroidism, unspecified: Secondary | ICD-10-CM | POA: Diagnosis not present

## 2021-03-30 DIAGNOSIS — M109 Gout, unspecified: Secondary | ICD-10-CM | POA: Diagnosis not present

## 2021-03-30 DIAGNOSIS — G2581 Restless legs syndrome: Secondary | ICD-10-CM | POA: Diagnosis not present

## 2021-03-30 NOTE — Progress Notes (Signed)
Cardiology Office Note:    Date:  04/01/2021   ID:  Chantee, Cerino 1945/10/13, MRN [id number]  PCP:  Carlena Hurl, PA-C  Cardiologist:  [name], MD   Referring MD: Girtha Rm, PA-C   Shortness of breath, peripheral edema  History of Present Illness:    Brandi Stephens is a 75 y.o. female with a hx of chronic diastolic CHF, hypertension, hyperlipidemia, hypothyroidism, GERD, left breast cancer status post lumpectomy/radiation/antiestrogen therapy in 2017, CKD stage IIIa, obesity, prediabetes, COPD, anxiety with depression, and gout who was last seen in consultation back in October 2022.  She presents today for a hospital follow-up appointment.  Patient follows Dr. Debara Pickett outpatient, she had a Holter monitor from October 2017 which showed sinus rhythm with tachycardia and infrequent PVCs.  Her last echocardiogram from March 2022 showed EF of 65 to 70%, no R MWA, moderate LVH, grade 1 diastolic dysfunction, normal RV no valvular disease, and mild dilatation of the ascending aorta 39 mm.  She had a recent low risk negative stress Myoview with LVEF 60% from September 2022.  She was seen by Roby Lofts, PA-C on December 23, 2020 in the office for ongoing complaints of lower extremity edema and preop evaluation for gastric bypass surgery.  BNP was normal.  Her venous Doppler was negative for DVT last done on 09/21/2020 and 01/19/2021.  Her cardiac work-up was not suggestive of significant cardiomyopathy or ischemia.  Her leg edema was felt to be due to venous insufficiency and obesity.  She was referred to vascular surgery and no intervention was recommended.  She was told to wear compression stockings and weight loss, while continuing a low-sodium diet and continuing her p.o. Lasix daily for chronic diastolic heart failure.  When she was evaluated for gastric bypass surgery she was having chronic DOE but no chest pain.  She was unable to complete 4 METS due to deconditioning,  weight, and stress stress.  She presented to [facility] emergency department on 01/18/2021 for shortness of breath.  Her PCP found her hypoxic with pulse ox in the high 80s on room air and tachycardic in the office.  She was placed on 4 L nasal cannula and sent to the ER for evaluation.  She complained of acute on chronic DOE and BLE edema 3 days prior to admission.  She states she is always short of breath with exertion, symptoms much worse when she visited the doctor due to anxiety.  She stated that she has been taking her Lasix 40 mg daily but feels it does not do much for her shortness of breath and swelling.  She was drinking a lot of water at baseline and not consuming much salt.  She stopped smoking 45 years ago but she did smoke half a pack a day for 50 years.  She states her COPD is mild and uses albuterol and Advair at home.  She denied any chest pain or pressure at that time.  She does not do much at baseline due to her chronic back pain and obesity.  She was given IV Lasix and an echocardiogram was performed on 01/20/2021 which showed EF of 60 to 65%, mild LVH, grade 1 diastolic dysfunction, and mild dilatation of ascending aorta 41 mm.  Today, she is not feeling well.  She endorses shortness of breath and still says that she has some peripheral edema.  She states that since she left the hospital her Bumex was increased from 1 mg  to 2 mg daily.  She has had 1 episode of hypotension while in the shower.  She got dizzy and lightheaded but did not pass out at this time.  Otherwise at home her blood pressures have been in the 712W systolic.  She said at the time of this episode in the shower she has a pulse ox at home and was able to put on her finger and it showed her oxygen was 82%.  She states she did fall about a month ago but she did not hit her head.  She landed on her bottom and the bedside table fell and hit her in the mouth.  She states that she has a fluttering feeling in her chest sometimes  several times a day but it only lasts for a few seconds.  We discussed her lab work today.  Her lipid panel looked good.  Her HDL was 53, LDL 95, total cholesterol 208.  Her triglycerides were elevated at 362.  Her triglycerides have never been at goal.  She is currently taking fish oil but I have written a prescription for Vascepa today as well as provided her some samples.  Her EKG shows sinus tachycardia today rate of 118.  She states that her heart rate is usually around 100.  Her Toprol XL was recently increased to 50 mg and she states that she feels better on this dose.  Since she has been switched to the higher dose of Bumex her weight continues to go down.  She has been watching her diet and has been maintaining a low-sodium diet with a fluid restriction of 1 L a day.  She also takes magnesium supplementation at night.  She has had a difficult time with chronic back pain over the years and has had 4 back surgeries.  She is on chronic narcotic medication at home for back pain.  She states that her shortness of breath is not a new problem.  She was recently hospitalized and given IV Lasix.  She does see a pulmonologist for her COPD and she has an appointment in January for PFTs.  She does occasionally have some chest pain but this mainly occurs during the time of palpitations.   Past Medical History:  Diagnosis Date   AIN (anal intraepithelial neoplasia) anal canal    Anemia    with pregnancy   Anxiety    Arthritis    knees, lower back, knees   Chronic constipation    Chronic diastolic CHF (congestive heart failure) ([place]) 07/05/2019   CKD (chronic kidney disease)    Coarse tremors    Essential   COPD (chronic obstructive pulmonary disease) ([place])    2017 chest CT, history of   Degenerative lumbar spinal stenosis    Depression    Depression 07/05/2019   Drug dependency ([place])    Elevated PTHrP level 05/09/2019   ETOH abuse 07/05/2019   GERD (gastroesophageal reflux disease)    pt denies    Gout    Grade I diastolic dysfunction 58/12/9831   Noted ECHO   Hemorrhoids    History of adenomatous polyp of colon    History of basal cell carcinoma (BCC) excision 2015   nose   History of sepsis 05/14/2014   post lumbar surgery   History of shingles    x2   Hyperlipidemia    Hypertension    Hypothyroidism    Irregular heart beat    history of while undergoing radiation   Malignant neoplasm of upper-inner quadrant of  left breast in female, estrogen receptor positive [facility]) oncologist-  dr Jana Hakim--  per lov in epic no recurrence   dx 07-13-2015  Left breast invasive ductal carcinoma, Stage IA, Grade 1 (TXN0),  09-16-2015  s/p  left breast lumpectomy with sln bx's,  completed radiation 11-18-2015,  started antiestrogen therapy 12-14-2015   Neuropathy    Left foot and back of left leg   Obesity (BMI 30-39.9) 07/05/2019   Personal history of radiation therapy    completed 11-18-2015  left breast   Pneumonia    PONV (postoperative nausea and vomiting)    Prediabetes    diet controlled, no med   Restless leg syndrome    Seasonal allergies    Seizure ([place]) 05/2015   due to sepsis, only one time episode   Tremor    Wears partial dentures    lower     Past Surgical History:  Procedure Laterality Date   ABDOMINAL HYSTERECTOMY  1985   BASAL CELL CARCINOMA EXCISION  10/15   nose   BREAST BIOPSY Right 08/24/2015   BREAST BIOPSY Right 07/13/2015   BREAST BIOPSY Left 07/13/2015   BREAST EXCISIONAL BIOPSY Left 1972   BREAST LUMPECTOMY Left    BREAST LUMPECTOMY WITH RADIOACTIVE SEED AND SENTINEL LYMPH NODE BIOPSY Left 09/16/2015   Procedure: BREAST LUMPECTOMY WITH RADIOACTIVE SEED AND SENTINEL LYMPH NODE BIOPSY;  Surgeon: Stark Klein, MD;  Location: [place];  Service: General;  Laterality: Left;   BREAST REDUCTION SURGERY Bilateral 1998   CATARACT EXTRACTION W/ INTRAOCULAR LENS IMPLANT Left 2016   [place]   hallo   COLONOSCOPY  01/2013    polyps   EYE SURGERY Left 2016   HAND SURGERY Left 02-19-2002   dr Fredna Dow  _0    repair collateral ligament/ MPJ left thumb   HARDWARE REMOVAL N/A 07/24/2019   Procedure: REVISION OF HARDWARE Lumbar two - Sacral one. Extention of Fusion to Lumbar one;  Surgeon: [name], MD;  Location: [place];  Service: Neurosurgery;  Laterality: N/A;   HEMORRHOID SURGERY  2008   HIGH RESOLUTION ANOSCOPY N/A 02/28/2018   Procedure: HIGH RESOLUTION ANOSCOPY WITH BIOPSY;  Surgeon: Leighton Ruff, MD;  Location: [place];  Service: General;  Laterality: N/A;   KNEE ARTHROSCOPY Right 2002   LUMBAR SPINE SURGERY  03-20-2015   dr Kathyrn Sheriff   fusion L4-5, L5-S1   MOUTH SURGERY  07/14/2018   2 infected teeth removed   MULTIPLE TOOTH EXTRACTIONS     with bone grafting lower bottom right and left   REDUCTION MAMMAPLASTY Bilateral    RIGHT COLECTOMY  09-10-2003   dr Margot Chimes _1    multiple colon polyps (per path tubular adenoma's, hyperplastic , benign appendix, benign two lymph nodes   ROTATOR CUFF REPAIR Left 04/2016   TONSILLECTOMY  1969   TUBAL LIGATION Bilateral 1982    Current Medications: Current Meds  Medication Sig   albuterol (VENTOLIN HFA) 108 (90 Base) MCG/ACT inhaler INHALE 2 PUFFS BY MOUTH EVERY 4 TO 6 HOURS AS NEEDED (Patient taking differently: Inhale 2 puffs into the lungs every 4 (four) hours as needed for wheezing.)   allopurinol (ZYLOPRIM) 300 MG tablet 1 daily (Patient taking differently: Take 300 mg by mouth daily. 1 daily)   amitriptyline (ELAVIL) 75 MG tablet TAKE 1 TABLET(75 MG) BY MOUTH AT BEDTIME (Patient taking differently: Take 75 mg by mouth at bedtime.)   atorvastatin (LIPITOR) 20 MG tablet TAKE 1 TABLET BY MOUTH EVERY DAY (  Patient taking differently: Take 20 mg by mouth daily.)   Biotin 3 MG TABS Take 3 mg by mouth daily.   blood glucose meter kit and supplies KIT Dispense based on patient and insurance preference. Use up to four times daily as directed.    bumetanide (BUMEX) 1 MG tablet Take 1 tablet (1 mg total) by mouth daily.   cephALEXin (KEFLEX) 500 MG capsule TAKE 1 CAPSULE(500 MG) BY MOUTH THREE TIMES DAILY (Patient taking differently: Take 500 mg by mouth 3 (three) times daily.)   Cholecalciferol (D3 ADULT PO) Take 1 capsule by mouth daily.   diazepam (VALIUM) 5 MG tablet Take 1 tablet (5 mg total) by mouth daily as needed for anxiety.   diclofenac Sodium (VOLTAREN) 1 % GEL Apply 4 g topically 4 (four) times daily.   docusate sodium (COLACE) 100 MG capsule Take 1 capsule (100 mg total) by mouth 3 (three) times daily. (Patient taking differently: Take 100 mg by mouth 2 (two) times daily.)   Dulaglutide (TRULICITY) 0.16 WF/0.9NA SOPN Inject 0.75 mg into the skin once a week.   DULoxetine (CYMBALTA) 60 MG capsule TAKE 1 CAPSULE(60 MG) BY MOUTH DAILY (Patient taking differently: Take 60 mg by mouth daily.)   Fluticasone-Salmeterol (ADVAIR) 250-50 MCG/DOSE AEPB Inhale 1 puff into the lungs every 12 (twelve) hours.   HYDROcodone-acetaminophen (NORCO) 5-325 MG tablet Take 1 tablet by mouth 2 (two) times daily as needed.   icosapent Ethyl (VASCEPA) 1 g capsule Take 2 capsules (2 g total) by mouth 2 (two) times daily.   levothyroxine (SYNTHROID) 75 MCG tablet TAKE 1 TABLET(75 MCG) BY MOUTH DAILY BEFORE BREAKFAST (Patient taking differently: Take 75 mcg by mouth daily before breakfast.)   magnesium gluconate (MAGONATE) 500 MG tablet Take 2 tablets (1,000 mg total) by mouth at bedtime.   metoprolol succinate (TOPROL-XL) 50 MG 24 hr tablet Take 1 tablet (50 mg total) by mouth daily. Take with or immediately following a meal.   primidone (MYSOLINE) 50 MG tablet TAKE 1 TABLET BY MOUTH TWICE DAILY (Patient taking differently: Take 50 mg by mouth 2 (two) times daily.)   Probiotic Product (PROBIOTIC DAILY PO) Take 1 tablet by mouth daily.   Propylene Glycol (SYSTANE BALANCE OP) Place 1 drop into both eyes 4 (four) times daily as needed (dry eyes).    rOPINIRole (REQUIP) 0.5 MG tablet Take 1.5 tablets (0.75 mg total) by mouth at bedtime.   topiramate (TOPAMAX) 25 MG tablet Take 1 tablet (25 mg total) by mouth at bedtime.   vitamin B-12 (CYANOCOBALAMIN) 1000 MCG tablet Take 1 tablet (1,000 mcg total) by mouth daily.   [DISCONTINUED] Omega-3 Fatty Acids (OMEGA-3 FISH OIL PO) Take 1 capsule by mouth daily.     Allergies:   Ampicillin, Gabapentin, Penicillins, Codeine, and Other   Social History   Socioeconomic History   Marital status: Married    Spouse name: robert   Number of children: 3   Years of education: college   Highest education level: Not on file  Occupational History   Occupation: retired  Tobacco Use   Smoking status: Former    Packs/day: 0.50    Years: 35.00    Pack years: 17.50    Types: Cigarettes    Quit date: 05/22/2018    Years since quitting: 2.8   Smokeless tobacco: Never  Vaping Use   Vaping Use: Never used  Substance and Sexual Activity   Alcohol use: Yes    Comment: 1 glass of wine a night  Drug use: Not Currently    Comment: CBD tincture   Sexual activity: Yes    Birth control/protection: Post-menopausal  Other Topics Concern   Not on file  Social History Narrative   Lives with husband in a 2 story home.  Has 2 living children.  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  Education: college.    Right-handed.   Five cups coffee per week.   Social Determinants of Health   Financial Resource Strain: Low Risk    Difficulty of Paying Living Expenses: Not hard at all  Food Insecurity: No Food Insecurity   Worried About Charity fundraiser in the Last Year: Never true   [place] in the Last Year: Never true  Transportation Needs: No Transportation Needs   Lack of Transportation (Medical): No   Lack of Transportation (Non-Medical): No  Physical Activity: Inactive   Days of Exercise per Week: 0 days   Minutes of Exercise per Session: 0 min  Stress: Stress Concern Present   Feeling of  Stress : To some extent  Social Connections: Not on file     Family History: The patient's family history includes Atrial fibrillation in her mother; Brain cancer in her maternal aunt; Breast cancer in her maternal aunt; Lung cancer in her maternal aunt and maternal uncle; Prostate cancer in her maternal grandfather; Stomach cancer in her maternal grandmother; Stroke in her paternal grandmother; Tuberculosis in her paternal grandfather; Ulcers in her father. There is no history of Colon cancer, Esophageal cancer, or Rectal cancer.  ROS:   Please see the history of present illness.     EKGs/Labs/Other Studies Reviewed:    The following studies were reviewed today:  Echocardiogram 01/20/2021 Impression: 1. Limited echo for LV function   2. Left ventricular ejection fraction, by estimation, is 60 to 65%. The  left ventricle has normal function. There is mild left ventricular  hypertrophy. Left ventricular diastolic parameters are consistent with  Grade I diastolic dysfunction (impaired  relaxation).   3. Aortic dilatation noted. There is mild dilatation of the ascending  aorta, measuring 41 mm.   4. The inferior vena cava is dilated in size with >50% respiratory  variability, suggesting right atrial pressure of 8 mmHg.   Comparison(s): Changes from prior study are noted. 07/07/2020: LVEF 65-70%.   FINDINGS   Left Ventricle: Left ventricular ejection fraction, by estimation, is 60  to 65%. The left ventricle has normal function. The left ventricular  internal cavity size was normal in size. There is mild left ventricular  hypertrophy. Left ventricular diastolic   parameters are consistent with Grade I diastolic dysfunction (impaired  relaxation). Indeterminate filling pressures.   Aorta: Aortic dilatation noted. There is mild dilatation of the ascending  aorta, measuring 41 mm.   Venous: The inferior vena cava is dilated in size with greater than 50%  respiratory variability,  suggesting right atrial pressure of 8 mmHg.   EKG:  EKG is  ordered today.  The ekg ordered today demonstrates sinus tachycardia rate 118 bpm  Recent Labs: 06/15/2020: ALT 22 01/18/2021: B Natriuretic Peptide 46.5 01/19/2021: Magnesium 2.4; TSH 4.495 02/05/2021: BUN 27; Creatinine, Ser 0.95; Potassium 5.1; Sodium 140 03/23/2021: Hemoglobin 13.0; Platelets 282  Recent Lipid Panel    Component Value Date/Time   CHOL 208 (H) 01/31/2020 1040   TRIG 362 (H) 01/31/2020 1040   HDL 53 01/31/2020 1040   CHOLHDL 3.9 01/31/2020 1040   CHOLHDL 4.2 07/08/2016 1042   VLDL  52 (H) 07/08/2016 1042   LDLCALC 95 01/31/2020 1040    Physical Exam:    VS:  BP 98/60 (BP Location: Right Arm, Patient Position: Sitting, Cuff Size: Large)    Pulse (!) 118    Ht 5' 8" (1.727 m)    Wt 270 lb 12.8 oz (122.8 kg)    SpO2 95%    BMI 41.17 kg/m     Wt Readings from Last 3 Encounters:  04/01/21 270 lb 12.8 oz (122.8 kg)  03/26/21 263 lb (119.3 kg)  03/23/21 270 lb 6.4 oz (122.7 kg)     GEN:  Well nourished, well developed in no acute distress HEENT: Normal NECK: No JVD; No carotid bruits LYMPHATICS: No lymphadenopathy CARDIAC: sinus tachycardia, no murmurs, rubs, gallops RESPIRATORY:  Clear to auscultation without rales, wheezing or rhonchi  ABDOMEN: Soft, non-tender, non-distended MUSCULOSKELETAL:  No pitting edema; No deformity  SKIN: Warm and dry NEUROLOGIC:  Alert and oriented x 3 PSYCHIATRIC:  Normal affect   ASSESSMENT:     Chronic diastolic heart failure Hypertension Obesity Hyperlipidemia Hypertriglyceridemia Mild aortic aneurysm on Echo Palpitations  PLAN:    In order of problems listed above:  LVEF 60 to 65%, mild grade 1 diastolic dysfunction according to echocardiograms from October 2022.  Unremarkable BNP.  She appears euvolemic on exam today. No pitting edema.  She is currently on Bumex 2 mg daily.  Encourage daily weights and if her weight increases by 2 to 3 pounds overnight or 5  pounds in a week she is to call our office.  We discussed adding Farxiga at her next appointment.  This may enable her to go back down to 1 mg of Bumex daily.  She has run out of insurance benefits for the year and would need to wait until the beginning of the year for any new prescriptions. Low normal in the office today.  She does have some occasional dizziness and lightheadedness.  She has had 1 episode of hypotension at home but otherwise runs in the 962E systolic.  Encouraged her to take her blood pressure daily and record these values.  Encouraged a low-sodium diet, fluid restriction diet. Lipid profile from last year showed HDL 53, LDL 95, total cholesterol 208.  Her goal LDL is below 100.  Continue Lipitor 20 mg daily. Most recent triglycerides were 362.  These were done October 2021.  She is already on fish oil.  I provided the patient with samples of Vascepa and sent in a prescription.  We will get a repeat lipid panel after her next appointment. Recommend tight BP control and annual Echo to monitor heart function, ascending aortic aneurysm and valvular function.  We discussed initiation of Wilder Glade which we will start when she returns for follow-up appointment since her insurance benefits ran out.  Continue to encourage daily weights. Palpitations seem to be frequent but brief.  We discussed possibly initiating a ZIO monitor which we can talk more about at her next appointment if her palpitations continue.  She is recommended to take magnesium at nighttime and we have ordered labs such as a CMP, CBC, and TSH.   Medication Adjustments/Labs and Tests Ordered: Current medicines are reviewed at length with the patient today.  Concerns regarding medicines are outlined above.  Orders Placed This Encounter  Procedures   Comprehensive metabolic panel   CBC   TSH   Magnesium   EKG 12-Lead   Meds ordered this encounter  Medications   icosapent Ethyl (VASCEPA) 1 g capsule  Sig: Take 2 capsules  (2 g total) by mouth 2 (two) times daily.    Dispense:  360 capsule    Refill:  3   Please follow-up in 2 to 3 weeks with an APP or Dr. Debara Pickett.  Signed, Elgie Collard, PA-C  04/01/2021 1:50 PM    [facility]

## 2021-04-01 ENCOUNTER — Ambulatory Visit (INDEPENDENT_AMBULATORY_CARE_PROVIDER_SITE_OTHER): Payer: PPO | Admitting: Physician Assistant

## 2021-04-01 ENCOUNTER — Encounter: Payer: Self-pay | Admitting: Physician Assistant

## 2021-04-01 ENCOUNTER — Other Ambulatory Visit: Payer: Self-pay

## 2021-04-01 VITALS — BP 98/60 | HR 118 | Ht 68.0 in | Wt 270.8 lb

## 2021-04-01 DIAGNOSIS — I10 Essential (primary) hypertension: Secondary | ICD-10-CM

## 2021-04-01 DIAGNOSIS — I5032 Chronic diastolic (congestive) heart failure: Secondary | ICD-10-CM | POA: Diagnosis not present

## 2021-04-01 DIAGNOSIS — R0602 Shortness of breath: Secondary | ICD-10-CM | POA: Diagnosis not present

## 2021-04-01 DIAGNOSIS — R002 Palpitations: Secondary | ICD-10-CM

## 2021-04-01 DIAGNOSIS — M79606 Pain in leg, unspecified: Secondary | ICD-10-CM | POA: Diagnosis not present

## 2021-04-01 DIAGNOSIS — M7989 Other specified soft tissue disorders: Secondary | ICD-10-CM | POA: Diagnosis not present

## 2021-04-01 DIAGNOSIS — E782 Mixed hyperlipidemia: Secondary | ICD-10-CM

## 2021-04-01 MED ORDER — ICOSAPENT ETHYL 1 G PO CAPS: 2.0000 g | ORAL_CAPSULE | Freq: Two times a day (BID) | ORAL | 3 refills | Status: DC

## 2021-04-01 NOTE — Patient Instructions (Addendum)
Medication Instructions:  Your physician has recommended you make the following change in your medication:  START VASCEPA 1 G: TAKE 2 TABLETS TWICE A DAY. STOP OMEGA FISH OIL  *If you need a refill on your cardiac medications before your next appointment, please call your pharmacy*   Lab Work: TODAY: CMET, CBC, TSH, MAGNESIUM  If you have labs (blood work) drawn today and your tests are completely normal, you will receive your results only by: [place] (if you have MyChart) OR A paper copy in the mail If you have any lab test that is abnormal or we need to change your treatment, we will call you to review the results.   Testing/Procedures: NONE   Follow-Up: At [facility], you and your health needs are our priority.  As part of our continuing mission to provide you with exceptional heart care, we have created designated Provider Care Teams.  These Care Teams include your primary Cardiologist (physician) and Advanced Practice Providers (APPs -  Physician Assistants and Nurse Practitioners) who all work together to provide you with the care you need, when you need it.  We recommend signing up for the patient portal called "MyChart".  Sign up information is provided on this After Visit Summary.  MyChart is used to connect with patients for Virtual Visits (Telemedicine).  Patients are able to view lab/test results, encounter notes, upcoming appointments, etc.  Non-urgent messages can be sent to your provider as well.   To learn more about what you can do with MyChart, go to [email or web address].    Your next appointment:   2-3 week(s)  The format for your next appointment:   In Person  Provider:   [name], MD  OTHER: RESTRICT FLUIDS TO LESS THAN 2 LITERS A DAY.  RESTRICT SODIUM INTAKE TO LESS THAN 1800 MG A DAY

## 2021-04-02 LAB — CBC
Hematocrit: 37.7 % (ref 34.0–46.6)
Hemoglobin: 12.6 g/dL (ref 11.1–15.9)
MCH: 31.7 pg (ref 26.6–33.0)
MCHC: 33.4 g/dL (ref 31.5–35.7)
MCV: 95 fL (ref 79–97)
Platelets: 257 10*3/uL (ref 150–450)
RBC: 3.98 x10E6/uL (ref 3.77–5.28)
RDW: 13.7 % (ref 11.7–15.4)
WBC: 12 10*3/uL — ABNORMAL HIGH (ref 3.4–10.8)

## 2021-04-02 LAB — COMPREHENSIVE METABOLIC PANEL
ALT: 21 IU/L (ref 0–32)
AST: 23 IU/L (ref 0–40)
Albumin/Globulin Ratio: 2 (ref 1.2–2.2)
Albumin: 4.8 g/dL — ABNORMAL HIGH (ref 3.7–4.7)
Alkaline Phosphatase: 111 IU/L (ref 44–121)
BUN/Creatinine Ratio: 34 — ABNORMAL HIGH (ref 12–28)
BUN: 42 mg/dL — ABNORMAL HIGH (ref 8–27)
Bilirubin Total: 0.4 mg/dL (ref 0.0–1.2)
CO2: 28 mmol/L (ref 20–29)
Calcium: 10.2 mg/dL (ref 8.7–10.3)
Chloride: 94 mmol/L — ABNORMAL LOW (ref 96–106)
Creatinine, Ser: 1.24 mg/dL — ABNORMAL HIGH (ref 0.57–1.00)
Globulin, Total: 2.4 g/dL (ref 1.5–4.5)
Glucose: 146 mg/dL — ABNORMAL HIGH (ref 70–99)
Potassium: 4.2 mmol/L (ref 3.5–5.2)
Sodium: 140 mmol/L (ref 134–144)
Total Protein: 7.2 g/dL (ref 6.0–8.5)
eGFR: 46 mL/min/{1.73_m2} — ABNORMAL LOW (ref >59)

## 2021-04-02 LAB — TSH: TSH: 2.2 u[IU]/mL (ref 0.450–4.500)

## 2021-04-02 LAB — MAGNESIUM: Magnesium: 2.8 mg/dL — ABNORMAL HIGH (ref 1.6–2.3)

## 2021-04-05 ENCOUNTER — Telehealth: Payer: Self-pay | Admitting: Internal Medicine

## 2021-04-05 NOTE — Telephone Encounter (Signed)
Patient was concerned because she was advised to see Dr. Debara Pickett in 2-3 weeks. As of right now there are not any appointments available with Dr. Debara Pickett or an APP at this location. The patient stated that she wants to be seen at [facility].

## 2021-04-05 NOTE — Telephone Encounter (Signed)
Pt was seen by Brandi Stephens on 04/01/21, pt was advised to be seen again in 2-3 weeks.

## 2021-04-06 ENCOUNTER — Other Ambulatory Visit: Payer: Self-pay

## 2021-04-06 ENCOUNTER — Encounter: Payer: Self-pay | Admitting: Internal Medicine

## 2021-04-06 ENCOUNTER — Ambulatory Visit (INDEPENDENT_AMBULATORY_CARE_PROVIDER_SITE_OTHER): Payer: PPO | Admitting: Internal Medicine

## 2021-04-06 DIAGNOSIS — J101 Influenza due to other identified influenza virus with other respiratory manifestations: Secondary | ICD-10-CM | POA: Diagnosis not present

## 2021-04-06 DIAGNOSIS — J449 Chronic obstructive pulmonary disease, unspecified: Secondary | ICD-10-CM | POA: Diagnosis not present

## 2021-04-06 MED ORDER — OSELTAMIVIR PHOSPHATE 75 MG PO CAPS
75.0000 mg | ORAL_CAPSULE | Freq: Two times a day (BID) | ORAL | 0 refills | Status: DC
Start: 2021-04-06 — End: 2021-04-16

## 2021-04-06 NOTE — Progress Notes (Signed)
Virtual Visit via Telephone Note  I connected with Brandi Stephens on 04/06/21 at  2:45 PM EST by telephone and verified that I am speaking with the correct person using two identifiers.  Location: Patient: Home Provider: [facility]   I discussed the limitations, risks, security and privacy concerns of performing an evaluation and management service by telephone and the availability of in person appointments. I also discussed with the patient that there may be a patient responsible charge related to this service. The patient expressed understanding and agreed to proceed.   History of Present Illness: I called and spoke with Brandi Stephens.  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  She has not had any problems tolerating it.  There has been no change in her back pain.  She is still struggling with heart failure and obesity.  She can barely ambulate at home.  She has not had a COVID booster or an annual influenza vaccine.  A close family member was diagnosed with symptomatic influenza A 4 days ago.  She woke up this morning with a sore throat and sinus congestion and is concerned that she has developed influenza as well.   Observations/Objective: Sed Rate (mm/h)  Date Value  11/03/2020 28  08/04/2020 28  06/10/2020 22   CRP (mg/L)  Date Value  11/03/2020 18.5 (H)  08/04/2020 18.5 (H)  06/10/2020 21.6 (H)  02/12/2019 <1     Assessment and Plan: She is not interested in stopping cephalexin and finding out if her infection has been cured or not.  She will continue that and follow-up here for repeat blood work in 3 months.  She probably does have influenza after recent exposure to a family member who is sick.  She is at very high risk of complications given her comorbidities.  I will treat her with oseltamavir.  Follow Up Instructions: Continue chronic suppressive cephalexin Oseltamivir 75 mg twice daily for 5 days Recommended  a COVID booster and getting an annual influenza vaccine once she is over this illness Follow-up here in 3 months  I discussed the assessment and treatment plan with the patient. The patient was provided an opportunity to ask questions and all were answered. The patient agreed with the plan and demonstrated an understanding of the instructions.   The patient was advised to call back or seek an in-person evaluation if the symptoms worsen or if the condition fails to improve as anticipated.  I provided 24 minutes of non-face-to-face time during this encounter.   [name], MD

## 2021-04-06 NOTE — Telephone Encounter (Signed)
Brandi Casino, MD  Ricci Barker, RN; Elgie Collard, PA-C 12 hours ago (8:18 PM)   I just don't have an appts - I think it would be good for her to follow-up with [name] if she can get her in.   Thanks.   Dr Luciano Cutter with patient - added her on to schedule Apr 22, 2021 @ 930

## 2021-04-14 DIAGNOSIS — F418 Other specified anxiety disorders: Secondary | ICD-10-CM | POA: Diagnosis not present

## 2021-04-14 DIAGNOSIS — E1122 Type 2 diabetes mellitus with diabetic chronic kidney disease: Secondary | ICD-10-CM | POA: Diagnosis not present

## 2021-04-14 DIAGNOSIS — G250 Essential tremor: Secondary | ICD-10-CM | POA: Diagnosis not present

## 2021-04-14 DIAGNOSIS — K5909 Other constipation: Secondary | ICD-10-CM | POA: Diagnosis not present

## 2021-04-14 DIAGNOSIS — J9601 Acute respiratory failure with hypoxia: Secondary | ICD-10-CM | POA: Diagnosis not present

## 2021-04-14 DIAGNOSIS — E1151 Type 2 diabetes mellitus with diabetic peripheral angiopathy without gangrene: Secondary | ICD-10-CM | POA: Diagnosis not present

## 2021-04-14 DIAGNOSIS — K6282 Dysplasia of anus: Secondary | ICD-10-CM | POA: Diagnosis not present

## 2021-04-14 DIAGNOSIS — E1142 Type 2 diabetes mellitus with diabetic polyneuropathy: Secondary | ICD-10-CM | POA: Diagnosis not present

## 2021-04-14 DIAGNOSIS — N1831 Chronic kidney disease, stage 3a: Secondary | ICD-10-CM | POA: Diagnosis not present

## 2021-04-14 DIAGNOSIS — M170 Bilateral primary osteoarthritis of knee: Secondary | ICD-10-CM | POA: Diagnosis not present

## 2021-04-14 DIAGNOSIS — G8929 Other chronic pain: Secondary | ICD-10-CM | POA: Diagnosis not present

## 2021-04-14 DIAGNOSIS — M109 Gout, unspecified: Secondary | ICD-10-CM | POA: Diagnosis not present

## 2021-04-14 DIAGNOSIS — I5032 Chronic diastolic (congestive) heart failure: Secondary | ICD-10-CM | POA: Diagnosis not present

## 2021-04-14 DIAGNOSIS — Z87891 Personal history of nicotine dependence: Secondary | ICD-10-CM | POA: Diagnosis not present

## 2021-04-14 DIAGNOSIS — G2581 Restless legs syndrome: Secondary | ICD-10-CM | POA: Diagnosis not present

## 2021-04-14 DIAGNOSIS — M48061 Spinal stenosis, lumbar region without neurogenic claudication: Secondary | ICD-10-CM | POA: Diagnosis not present

## 2021-04-14 DIAGNOSIS — M47816 Spondylosis without myelopathy or radiculopathy, lumbar region: Secondary | ICD-10-CM | POA: Diagnosis not present

## 2021-04-14 DIAGNOSIS — I872 Venous insufficiency (chronic) (peripheral): Secondary | ICD-10-CM | POA: Diagnosis not present

## 2021-04-14 DIAGNOSIS — E039 Hypothyroidism, unspecified: Secondary | ICD-10-CM | POA: Diagnosis not present

## 2021-04-14 DIAGNOSIS — K219 Gastro-esophageal reflux disease without esophagitis: Secondary | ICD-10-CM | POA: Diagnosis not present

## 2021-04-14 DIAGNOSIS — E785 Hyperlipidemia, unspecified: Secondary | ICD-10-CM | POA: Diagnosis not present

## 2021-04-14 DIAGNOSIS — I130 Hypertensive heart and chronic kidney disease with heart failure and stage 1 through stage 4 chronic kidney disease, or unspecified chronic kidney disease: Secondary | ICD-10-CM | POA: Diagnosis not present

## 2021-04-14 DIAGNOSIS — Z9981 Dependence on supplemental oxygen: Secondary | ICD-10-CM | POA: Diagnosis not present

## 2021-04-15 ENCOUNTER — Telehealth: Payer: Self-pay | Admitting: Internal Medicine

## 2021-04-15 MED ORDER — TRULICITY 0.75 MG/0.5ML ~~LOC~~ SOAJ: 0.7500 mg | SUBCUTANEOUS | 1 refills | Status: DC

## 2021-04-15 MED ORDER — BUMETANIDE 1 MG PO TABS: 1.0000 mg | ORAL_TABLET | Freq: Two times a day (BID) | ORAL | 1 refills | Status: DC

## 2021-04-15 MED ORDER — BUMETANIDE 1 MG PO TABS: 1.0000 mg | ORAL_TABLET | Freq: Two times a day (BID) | ORAL | 2 refills | Status: DC

## 2021-04-15 NOTE — Telephone Encounter (Signed)
Pt called and states you increased her bumetanide to 2 tablets daily but only 1 tablet is on the sig. Please advise if I can refill to 2 tablets day.

## 2021-04-15 NOTE — Telephone Encounter (Signed)
Pt was notified.  

## 2021-04-16 ENCOUNTER — Encounter: Payer: Self-pay | Admitting: Family Medicine

## 2021-04-16 ENCOUNTER — Telehealth (INDEPENDENT_AMBULATORY_CARE_PROVIDER_SITE_OTHER): Payer: PPO | Admitting: Family Medicine

## 2021-04-16 ENCOUNTER — Other Ambulatory Visit: Payer: Self-pay

## 2021-04-16 VITALS — HR 110 | Wt 269.0 lb

## 2021-04-16 DIAGNOSIS — A4901 Methicillin susceptible Staphylococcus aureus infection, unspecified site: Secondary | ICD-10-CM | POA: Diagnosis not present

## 2021-04-16 DIAGNOSIS — J069 Acute upper respiratory infection, unspecified: Secondary | ICD-10-CM | POA: Diagnosis not present

## 2021-04-16 NOTE — Progress Notes (Signed)
° °  Subjective:    Patient ID: Brandi Stephens, female    DOB: [birth year], 75 y.o.   MRN: [id number]  HPI Documentation for virtual audio and video telecommunications through [facility] encounter:  The patient was located at home. 2 patient identifiers used.  The provider was located in the office. The patient did consent to this visit and is aware of possible charges through their insurance for this visit.  The other persons participating in this telemedicine service were none. Time spent on call was 5 minutes and in review of previous records >19  minutes total for counseling and coordination of care.  This virtual service is not related to other E/M service within previous 7 days.  She has a previous history of influenza and being treated on December 20 by Dr. Megan Salon.  She was given Tamiflu and did feel much better until this morning when she noted a slight cough, rhinorrhea, sneezing and sore throat.  She presently is on Keflex 3 times daily for treatment of MSSA of the spine.  Review of Systems     Objective:   Physical Exam Alert and in no distress.  Slight hoarse voice is noted.       Assessment & Plan:  Viral URI  MSSA (methicillin susceptible Staphylococcus aureus) infection I explained that I did not think this was another case of influenza.  She presently is on an antibiotic for treatment of the staph infection so I do not think that this is necessarily a bacterial infection.  Recommend treating this like its a viral URI with Allegra from the sneezing and rhinorrhea.  Gargle as needed for the sore throat and use Tylenol for fever aches and pains.  She will keep me informed.  She was comfortable with this approach.

## 2021-04-20 DIAGNOSIS — I5032 Chronic diastolic (congestive) heart failure: Secondary | ICD-10-CM | POA: Diagnosis not present

## 2021-04-20 DIAGNOSIS — G2581 Restless legs syndrome: Secondary | ICD-10-CM | POA: Diagnosis not present

## 2021-04-20 DIAGNOSIS — K219 Gastro-esophageal reflux disease without esophagitis: Secondary | ICD-10-CM | POA: Diagnosis not present

## 2021-04-20 DIAGNOSIS — K6282 Dysplasia of anus: Secondary | ICD-10-CM | POA: Diagnosis not present

## 2021-04-20 DIAGNOSIS — E1151 Type 2 diabetes mellitus with diabetic peripheral angiopathy without gangrene: Secondary | ICD-10-CM | POA: Diagnosis not present

## 2021-04-20 DIAGNOSIS — M170 Bilateral primary osteoarthritis of knee: Secondary | ICD-10-CM | POA: Diagnosis not present

## 2021-04-20 DIAGNOSIS — K5909 Other constipation: Secondary | ICD-10-CM | POA: Diagnosis not present

## 2021-04-20 DIAGNOSIS — M109 Gout, unspecified: Secondary | ICD-10-CM | POA: Diagnosis not present

## 2021-04-20 DIAGNOSIS — J9601 Acute respiratory failure with hypoxia: Secondary | ICD-10-CM | POA: Diagnosis not present

## 2021-04-20 DIAGNOSIS — E039 Hypothyroidism, unspecified: Secondary | ICD-10-CM | POA: Diagnosis not present

## 2021-04-20 DIAGNOSIS — M48061 Spinal stenosis, lumbar region without neurogenic claudication: Secondary | ICD-10-CM | POA: Diagnosis not present

## 2021-04-20 DIAGNOSIS — I872 Venous insufficiency (chronic) (peripheral): Secondary | ICD-10-CM | POA: Diagnosis not present

## 2021-04-20 DIAGNOSIS — G8929 Other chronic pain: Secondary | ICD-10-CM | POA: Diagnosis not present

## 2021-04-20 DIAGNOSIS — M47816 Spondylosis without myelopathy or radiculopathy, lumbar region: Secondary | ICD-10-CM | POA: Diagnosis not present

## 2021-04-20 DIAGNOSIS — E1122 Type 2 diabetes mellitus with diabetic chronic kidney disease: Secondary | ICD-10-CM | POA: Diagnosis not present

## 2021-04-20 DIAGNOSIS — N1831 Chronic kidney disease, stage 3a: Secondary | ICD-10-CM | POA: Diagnosis not present

## 2021-04-20 DIAGNOSIS — J439 Emphysema, unspecified: Secondary | ICD-10-CM | POA: Diagnosis not present

## 2021-04-20 DIAGNOSIS — I130 Hypertensive heart and chronic kidney disease with heart failure and stage 1 through stage 4 chronic kidney disease, or unspecified chronic kidney disease: Secondary | ICD-10-CM | POA: Diagnosis not present

## 2021-04-20 DIAGNOSIS — F418 Other specified anxiety disorders: Secondary | ICD-10-CM | POA: Diagnosis not present

## 2021-04-20 DIAGNOSIS — E1142 Type 2 diabetes mellitus with diabetic polyneuropathy: Secondary | ICD-10-CM | POA: Diagnosis not present

## 2021-04-20 DIAGNOSIS — E785 Hyperlipidemia, unspecified: Secondary | ICD-10-CM | POA: Diagnosis not present

## 2021-04-20 DIAGNOSIS — G250 Essential tremor: Secondary | ICD-10-CM | POA: Diagnosis not present

## 2021-04-20 DIAGNOSIS — Z9981 Dependence on supplemental oxygen: Secondary | ICD-10-CM | POA: Diagnosis not present

## 2021-04-21 ENCOUNTER — Telehealth: Payer: Self-pay | Admitting: Medical

## 2021-04-21 DIAGNOSIS — J449 Chronic obstructive pulmonary disease, unspecified: Secondary | ICD-10-CM | POA: Diagnosis not present

## 2021-04-21 DIAGNOSIS — I509 Heart failure, unspecified: Secondary | ICD-10-CM | POA: Diagnosis not present

## 2021-04-21 DIAGNOSIS — Z6841 Body Mass Index (BMI) 40.0 and over, adult: Secondary | ICD-10-CM | POA: Diagnosis not present

## 2021-04-21 DIAGNOSIS — Z9981 Dependence on supplemental oxygen: Secondary | ICD-10-CM | POA: Diagnosis not present

## 2021-04-21 NOTE — Telephone Encounter (Signed)
Brandi Stephens is requesting verbal orders for PT 1 time a week  for 6 weeks She can be reached at [phone]

## 2021-04-22 ENCOUNTER — Telehealth: Payer: Self-pay | Admitting: Pharmacist

## 2021-04-22 ENCOUNTER — Encounter: Payer: Self-pay | Admitting: Internal Medicine

## 2021-04-22 ENCOUNTER — Other Ambulatory Visit: Payer: Self-pay

## 2021-04-22 ENCOUNTER — Ambulatory Visit (INDEPENDENT_AMBULATORY_CARE_PROVIDER_SITE_OTHER): Payer: PPO | Admitting: Internal Medicine

## 2021-04-22 VITALS — BP 108/62 | HR 124 | Ht 68.0 in | Wt 271.0 lb

## 2021-04-22 DIAGNOSIS — R0602 Shortness of breath: Secondary | ICD-10-CM | POA: Diagnosis not present

## 2021-04-22 DIAGNOSIS — Z79899 Other long term (current) drug therapy: Secondary | ICD-10-CM | POA: Diagnosis not present

## 2021-04-22 DIAGNOSIS — R000 Tachycardia, unspecified: Secondary | ICD-10-CM | POA: Diagnosis not present

## 2021-04-22 DIAGNOSIS — E782 Mixed hyperlipidemia: Secondary | ICD-10-CM | POA: Diagnosis not present

## 2021-04-22 NOTE — Chronic Care Management (AMB) (Signed)
Chronic Care Management Pharmacy Assistant   Name: Brandi Stephens  MRN: [id number] DOB: 12-Jan-1946  Reason for Encounter: Chart Prep for Initial Encounter with [facility] Pharmacist on 04/29/21 at 1:30 via phone   Conditions to be addressed/monitored: CHF, HTN, HLD, COPD, and CKD Stage 3  Recent office visits:  04/16/21 Brandi Lung, MD - Patient presented via Video Visit for Viral URI and other concerns. Stopped Oseltamivir and Stopped Quetiapine  02/26/21 Stephens, Brandi Eng, PA-C - Patient presented for weight gain and other concerns. Prescribed Baclofen 10 mg, Dulaglutide 0.75 mg & Hydrocodone Acetaminophen 5-325 mg  02/26/21 Brandi Simmering, LPN - Patient presented for Medicare Annual Wellness Exam. No medication changes.  02/04/21 Stephens, Brandi Eng, PA-C - Patient presented for Acute respiratory failure with hypoxia and other concerns. No medication changes.    Recent consult visits:  04/22/21 Brandi Casino, MD (Cardiology) - Patient presented for Shortness of Breath and other concerns. Decreased Bumetanide to 1 mg once daily. Decreased Magnesium Gluconate to 500 mg daily  04/06/21 Brandi Bickers, MD (Inf Disease) - Patient presented for Influenza A. Prescribed Oseltamivir Phosphate  04/01/21 Brandi Stephens (Cardiology) - Patient presented for Pain and swelling of lower extremity and other concerns. Prescribed Icosapent Ethyl. Stopped Omega -3's  03/29/21 Stephens, Brandi Deutscher, MD (Phys Med) - Patient presented for Idiopathic progressive neuropathy and other concerns. Prescribed Topiramate 25 mg   03/26/21 Stephens, Brandi Deutscher, MD (Phys Med) - Patient presented for Idiopathic progressive neuropathy and other concerns. No medication changes.  03/23/21 Stephens, Brandi Dad, MD (Oncology) - Patient presented for Malignant neoplasm of upper - inner quadrant of left breast in female. Stopped Anastrozole 1 mg.   02/04/21 Brandi Bickers, MD (Inf Disease) - Patient  presented for Osteomyelitis of lumbar spine. No medication changes.  01/18/21 Stephens, Brandi L, PA-C (Fam Med) - Patient presented for Hypoxia and other concerns. Stopped Fluorometholone 0.1%  12/31/20 Stephens, Brandi Dad, MD (Oncology) - Patient presented for Malignant neoplasm of upper - inner quad of left breast in female. No medication changes.  12/23/20 Stephens, Brandi Cover., PA-C (Cardiology) - Patient presented for Bilateral lower extremity edema and other concerns. No medication changes.  12/17/20 Stephens, Brandi Deutscher, MD (Phys Med) - Patient presented for Anxiety and other concerns. Prescribed Ergocalciferol 50000 units.   11/05/20 Brandi Dutch, MD (Vasc Surg) - Patient presented for Pain and swelling of left lower extremity. No medication changes.   11/03/20 Brandi Bickers, MD (Inf Disease) - Patient presented for Acute osteomyelitis of lumbar spine. No medication changes.  Hospital visits:  Medication Reconciliation was completed by comparing discharge summary, patients EMR and Pharmacy list, and upon discussion with patient.  Patient presented to [facility] on 01/18/21 for Acute hypoxemic respiratory failure. Patient was present for 8 days.    New?Medications Started at [facility] Discharge:?? -started blood glucose meter kit and supplies bumetanide (BUMEX) ipratropium-albuterol (DUONEB) oxyCODONE-acetaminophen (PERCOCET/ROXICET) pantoprazole (PROTONIX) Start taking on: January 27, 2021  Medication Changes at Hospital Discharge: -Changed  metoprolol succinate  Medications Discontinued at Hospital Discharge: -Stopped  furosemide 40 MG  Medications that remain the same after Hospital Discharge:??  -All other medications will remain the same.    Medications: Outpatient Encounter Medications as of 04/22/2021  Medication Sig   albuterol (VENTOLIN HFA) 108 (90 Base) MCG/ACT inhaler INHALE 2 PUFFS BY MOUTH EVERY 4 TO 6 HOURS AS NEEDED (Patient taking differently:  Inhale 2 puffs into the lungs every 4 (  four) hours as needed for wheezing.)   allopurinol (ZYLOPRIM) 300 MG tablet 1 daily (Patient taking differently: Take 300 mg by mouth daily. 1 daily)   amitriptyline (ELAVIL) 75 MG tablet TAKE 1 TABLET(75 MG) BY MOUTH AT BEDTIME (Patient taking differently: Take 75 mg by mouth at bedtime.)   atorvastatin (LIPITOR) 20 MG tablet TAKE 1 TABLET BY MOUTH EVERY DAY (Patient taking differently: Take 20 mg by mouth daily.)   baclofen (LIORESAL) 10 MG tablet Take 1 tablet (10 mg total) by mouth 2 (two) times daily.   betamethasone dipropionate 0.05 % cream Apply 1 application topically daily as needed (irritated skin).   Biotin 3 MG TABS Take 3 mg by mouth daily.   blood glucose meter kit and supplies KIT Dispense based on patient and insurance preference. Use up to four times daily as directed.   bumetanide (BUMEX) 1 MG tablet Take 1 mg by mouth daily.   cephALEXin (KEFLEX) 500 MG capsule TAKE 1 CAPSULE(500 MG) BY MOUTH THREE TIMES DAILY (Patient taking differently: Take 500 mg by mouth 3 (three) times daily.)   Cholecalciferol (D3 ADULT PO) Take 1 capsule by mouth daily.   diazepam (VALIUM) 5 MG tablet Take 1 tablet (5 mg total) by mouth daily as needed for anxiety.   diclofenac Sodium (VOLTAREN) 1 % GEL Apply 4 g topically 4 (four) times daily.   docusate sodium (COLACE) 100 MG capsule Take 1 capsule (100 mg total) by mouth 3 (three) times daily. (Patient taking differently: Take 100 mg by mouth 2 (two) times daily.)   Dulaglutide (TRULICITY) 3.72 BM/2.1JD SOPN Inject 0.75 mg into the skin once a week.   DULoxetine (CYMBALTA) 60 MG capsule TAKE 1 CAPSULE(60 MG) BY MOUTH DAILY (Patient taking differently: Take 60 mg by mouth daily.)   Fluticasone-Salmeterol (ADVAIR) 250-50 MCG/DOSE AEPB Inhale 1 puff into the lungs every 12 (twelve) hours.   HYDROcodone-acetaminophen (NORCO) 5-325 MG tablet Take 1 tablet by mouth 2 (two) times daily as needed.   icosapent Ethyl  (VASCEPA) 1 g capsule Take 2 capsules (2 g total) by mouth 2 (two) times daily.   ipratropium-albuterol (DUONEB) 0.5-2.5 (3) MG/3ML SOLN Take 3 mLs by nebulization every 6 (six) hours as needed.   levothyroxine (SYNTHROID) 75 MCG tablet TAKE 1 TABLET(75 MCG) BY MOUTH DAILY BEFORE BREAKFAST (Patient taking differently: Take 75 mcg by mouth daily before breakfast.)   lidocaine (LIDODERM) 5 % UNWRAP AND APPLY 2 PATCHES TO SKIN DAILY, REMOVE AND DISCARD PATCH WITHIN 12 HOURS OR AS DIRECTED BY DOCTOR   magnesium gluconate (MAGONATE) 500 MG tablet Take 500 mg by mouth daily.   metoprolol succinate (TOPROL-XL) 50 MG 24 hr tablet Take 1 tablet (50 mg total) by mouth daily. Take with or immediately following a meal.   oxyCODONE-acetaminophen (PERCOCET/ROXICET) 5-325 MG tablet Take 1 tablet by mouth every 6 (six) hours as needed for moderate pain or severe pain.   pantoprazole (PROTONIX) 40 MG tablet Take 1 tablet (40 mg total) by mouth daily at 6 (six) AM.   polyethylene glycol powder (GLYCOLAX/MIRALAX) 17 GM/SCOOP powder Take 17 g by mouth daily as needed for moderate constipation.   primidone (MYSOLINE) 50 MG tablet TAKE 1 TABLET BY MOUTH TWICE DAILY (Patient taking differently: Take 50 mg by mouth 2 (two) times daily.)   Probiotic Product (PROBIOTIC DAILY PO) Take 1 tablet by mouth daily.   Propylene Glycol (SYSTANE BALANCE OP) Place 1 drop into both eyes 4 (four) times daily as needed (dry eyes).   rOPINIRole (  REQUIP) 0.5 MG tablet Take 1.5 tablets (0.75 mg total) by mouth at bedtime.   topiramate (TOPAMAX) 25 MG tablet Take 1 tablet (25 mg total) by mouth at bedtime.   vitamin B-12 (CYANOCOBALAMIN) 1000 MCG tablet Take 1 tablet (1,000 mcg total) by mouth daily.   No facility-administered encounter medications on file as of 04/22/2021.  Fill History  : ALBUTEROL HFA INH (200 PUFFS)8.5GM 08/09/2019 83   ALLOPURINOL 300MG TABLETS 10/09/2020 90   AMITRIPTYLINE 75MG TABLETS 02/23/2021 90   ATORVASTATIN  CALCIUM 20MG TABLET 01/08/2021 90   BACLOFEN 10MG TABLETS 02/26/2021 15   BETAMETHASONE DIP 0.05% CRM 45GM 12/25/2019 20   ACCU-CHEK GUIDE ME KIT 01/27/2021 30   BUMETANIDE 1MG TABLETS 04/15/2021 90   CEPHALEXIN 500MG CAPSULES 03/17/2021 30   DIAZEPAM 5MG TABLETS 12/17/2020 90   DICLOFENAC 1% GEL 100GM 22/97/9892 21   TRULICITY 1.19ER/7.4YC SOLUTION PEN-INJECTOR 03/24/2021 28   DULOXETINE DR 60MG CAPSULES 02/09/2021 90   ADVAIR DISKUS 250/50MCG (YELLOW) 60 08/10/2020 30   HYDROCODONE/ACETAMINOPHEN 5-325 TB 02/26/2021 10   LEVOTHYROXINE 0.075MG (75MCG) TABS 04/09/2021 90   METOPROLOL ER SUCCINATE 50MG TABS 01/26/2021 90   OXYCODONE/ACETAMINOPHEN 5-325MG TAB 01/26/2021 1   PANTOPRAZOLE 40MG TABLETS 01/27/2021 30   PRIMIDONE 50MG TABLETS 03/10/2021 90   ROPINIROLE HCL 0.5MG TABLET 03/05/2021 90   TOPIRAMATE 25MG TABLETS 03/29/2021 90    Please call cell [phone]  Have you seen any other providers since your last visit? Patient reports she recently saw her cardiologist whom she sees regularly.  Any changes in your medications or health? Patient reports her magnesium and her diuretic were both decreased.  Any side effects from any medications? Patient reports she has a dry mouth almost always, she reports she is also dizzy but she does not attribute that to the medications she takes.  Do you have an symptoms or problems not managed by your medications? Patient reports no  Any concerns about your health right now? Patient reports no  Has your provider asked that you check blood pressure, blood sugar, or follow special diet at home? Patient reports she has an arm cuff for her blood pressure and she also checks her sugars at home.  Do you get any type of exercise on a regular basis? Patient reports she has trouble getting up and dressed, she reports she is using a walker for mobility and also has an oxygen tank, she does move around some in the house.  Can you think  of a goal you would like to reach for your health? Patient reports she has had many back surgeries and an infusion, has chronic pain that she would like to be better.  Do you have any problems getting your medications? Patient reports she is happy with her current pharmacy and has no financial barriers to obtaining her medications.  Is there anything that you would like to discuss during the appointment? Patient reports none  Patient aware to have available medications as well as sugar/blood pressure readings for the call.    Care Gaps: BP- 108/62 (Office 04/22/21) AWV-  11/22 Urine Micro - Overdue COVID Booster - Overdue Colonoscopy - Overdue Lab Results  Component Value Date   HGBA1C 7.0 (H) 01/19/2021    Star Rating Drugs: Atorvastatin 20 mg - Last filled 01/08/21 90 DS at [facility] Dulaglutide (Trulicity) 1.44 mg - Last filled 03/24/21 28 DS at [facility] <5 days did not verify     [facility] Clinical Pharmacist Assistant [phone]

## 2021-04-22 NOTE — Progress Notes (Signed)
OFFICE NOTE  Chief Complaint:  Leg swelling  Primary Care Physician: Carlena Hurl, PA-C  HPI:  Brandi Stephens is a 76 y.o. female who is undergoing radiation treatment for breast cancer. She has been feeling palpitations and chest discomfort recently and was referred for evaluation. She has cardiac risk factors, including 35 pack year smoking history, age, hypertension and prediabetes. She reports her episodes of palpitations and associated chest discomfort last for a few minutes and resolve. Not necessarily at exertion or relieved by rest. Recent CT angio showed aortic atherlosclerosis. She also has unexplained tachycardia at rest.  03/29/2016  Brandi Stephens returns today for follow-up. She underwent nuclear stress test which was negative for ischemia. She also had an echocardiogram which showed normal LV function and mild diastolic dysfunction. She wore a monitor which indicated sinus tachycardia and occasional PVCs. This consistent with her symptoms of intermittent palpitations.  07/05/2016  Brandi Stephens returns today for follow-up. She underwent successful shoulder surgery without complications. She is undergoing recovery. She is taking low-dose metoprolol and finds that she has very infrequent palpitations. She denies any worsening shortness of breath or chest discomfort. Blood pressure is low normal but she is asymptomatic with this.  06/15/2020  Brandi Stephens is seen today as a new patient visit.  I did see her in 2018 however she is considered new patient as it has been more than 3 years since I last saw her.  Previously she had issues with palpitations however more recently she has had difficulty with leg swelling.  She has had numerous back surgeries and has peripheral neuropathy.  She had had recent Dopplers which showed no evidence of DVT but has some degree of asymmetric swelling, worse in the left leg than the right.  In general she has 2-3+ edema with venous congestion and  discoloration of her toes.  She denies any shortness of breath.  She is seen by pulmonary and recent BNP was low/negative.  Despite this, she is diagnosed with diastolic heart failure.  04/22/2021  Brandi Stephens is seen today for follow-up.  She has been seen a number of times by our PA's after her most recent surgery which was a revision of hardware in her back in April.  Since then she has had worsening lower extremity swelling and shortness of breath.  More recently she has been on oxygen.  She has some follow-up scheduled with pulmonary in January.  She has had increasing doses of diuretics for her lower extremity edema.  She was switched from Lasix to Bumex and currently is on 1 mg twice daily.  She has been dizzy with low blood pressure and is tachycardic today.  Is also been on magnesium supplementation to help with constipation.  Repeat magnesium and December was evaded at 2.8 and her renal function was noted to be slightly worse.  She has not had repeat labs since then.  She is scheduled to see pulmonary and have PFTs as well as a consultation next week  PMHx:  Past Medical History:  Diagnosis Date   AIN (anal intraepithelial neoplasia) anal canal    Anemia    with pregnancy   Anxiety    Arthritis    knees, lower back, knees   Chronic constipation    Chronic diastolic CHF (congestive heart failure) ([place]) 07/05/2019   CKD (chronic kidney disease)    Coarse tremors    Essential   COPD (chronic obstructive pulmonary disease) ([place])    2017 chest CT,  history of   Degenerative lumbar spinal stenosis    Depression    Depression 07/05/2019   Drug dependency ([place])    Elevated PTHrP level 05/09/2019   ETOH abuse 07/05/2019   GERD (gastroesophageal reflux disease)    pt denies   Gout    Grade I diastolic dysfunction 38/75/6433   Noted ECHO   Hemorrhoids    History of adenomatous polyp of colon    History of basal cell carcinoma (BCC) excision 2015   nose   History of sepsis 05/14/2014    post lumbar surgery   History of shingles    x2   Hyperlipidemia    Hypertension    Hypothyroidism    Irregular heart beat    history of while undergoing radiation   Malignant neoplasm of upper-inner quadrant of left breast in female, estrogen receptor positive [facility]) oncologist-  dr Jana Hakim--  per lov in epic no recurrence   dx 07-13-2015  Left breast invasive ductal carcinoma, Stage IA, Grade 1 (TXN0),  09-16-2015  s/p  left breast lumpectomy with sln bx's,  completed radiation 11-18-2015,  started antiestrogen therapy 12-14-2015   Neuropathy    Left foot and back of left leg   Obesity (BMI 30-39.9) 07/05/2019   Personal history of radiation therapy    completed 11-18-2015  left breast   Pneumonia    PONV (postoperative nausea and vomiting)    Prediabetes    diet controlled, no med   Restless leg syndrome    Seasonal allergies    Seizure ([place]) 05/2015   due to sepsis, only one time episode   Tremor    Wears partial dentures    lower     Past Surgical History:  Procedure Laterality Date   ABDOMINAL HYSTERECTOMY  1985   BASAL CELL CARCINOMA EXCISION  10/15   nose   BREAST BIOPSY Right 08/24/2015   BREAST BIOPSY Right 07/13/2015   BREAST BIOPSY Left 07/13/2015   BREAST EXCISIONAL BIOPSY Left 1972   BREAST LUMPECTOMY Left    BREAST LUMPECTOMY WITH RADIOACTIVE SEED AND SENTINEL LYMPH NODE BIOPSY Left 09/16/2015   Procedure: BREAST LUMPECTOMY WITH RADIOACTIVE SEED AND SENTINEL LYMPH NODE BIOPSY;  Surgeon: Stark Klein, MD;  Location: [place];  Service: General;  Laterality: Left;   BREAST REDUCTION SURGERY Bilateral 1998   CATARACT EXTRACTION W/ INTRAOCULAR LENS IMPLANT Left 2016   [place]   hallo   COLONOSCOPY  01/2013   polyps   EYE SURGERY Left 2016   HAND SURGERY Left 02-19-2002   dr Fredna Dow  '@[email or web address]'    repair collateral ligament/ MPJ left thumb   HARDWARE REMOVAL N/A 07/24/2019   Procedure: REVISION OF HARDWARE Lumbar two - Sacral one.  Extention of Fusion to Lumbar one;  Surgeon: [name], MD;  Location: [place];  Service: Neurosurgery;  Laterality: N/A;   HEMORRHOID SURGERY  2008   HIGH RESOLUTION ANOSCOPY N/A 02/28/2018   Procedure: HIGH RESOLUTION ANOSCOPY WITH BIOPSY;  Surgeon: Leighton Ruff, MD;  Location[facility];  Service: General;  Laterality: N/A;   KNEE ARTHROSCOPY Right 2002   LUMBAR SPINE SURGERY  03-20-2015   dr Kathyrn Sheriff   fusion L4-5, L5-S1   MOUTH SURGERY  07/14/2018   2 infected teeth removed   MULTIPLE TOOTH EXTRACTIONS     with bone grafting lower bottom right and left   REDUCTION MAMMAPLASTY Bilateral    RIGHT COLECTOMY  09-10-2003   dr Margot Chimes '@[email or web address]'    multiple  colon polyps (per path tubular adenoma's, hyperplastic , benign appendix, benign two lymph nodes   ROTATOR CUFF REPAIR Left 04/2016   TONSILLECTOMY  1969   TUBAL LIGATION Bilateral 1982    FAMHx:  Family History  Problem Relation Age of Onset   Atrial fibrillation Mother    Ulcers Father    Breast cancer Maternal Aunt    Lung cancer Maternal Uncle    Stomach cancer Maternal Grandmother    Lung cancer Maternal Aunt    Brain cancer Maternal Aunt    Prostate cancer Maternal Grandfather    Stroke Paternal Grandmother    Tuberculosis Paternal Grandfather    Colon cancer Neg Hx    Esophageal cancer Neg Hx    Rectal cancer Neg Hx     SOCHx:   reports that she quit smoking about 2 years ago. Her smoking use included cigarettes. She has a 17.50 pack-year smoking history. She has never used smokeless tobacco. She reports current alcohol use. She reports that she does not currently use drugs.  ALLERGIES:  Allergies  Allergen Reactions   Ampicillin Hives and Other (See Comments)    Severe reaction in February 2017 1.5 month to have hives to go away   Gabapentin Swelling    UNSPECIFIED REACTION    Penicillins Hives and Other (See Comments)    Severe reaction in February 2017 1.5 month to have hives to go  away Has patient had a PCN reaction causing immediate rash, facial/tongue/throat swelling, SOB or lightheadedness with hypotension: #  #  #  YES  #  #  #  Has patient had a PCN reaction causing severe rash involving mucus membranes or skin necrosis: No Has patient had a PCN reaction that required hospitalization No Has patient had a PCN reaction occurring within the last 10 years: No    Codeine Nausea And Vomiting   Other Itching    UNSPECIFIED Analgesics    ROS: Pertinent items noted in HPI and remainder of comprehensive ROS otherwise negative.  HOME MEDS: Current Outpatient Medications on File Prior to Visit  Medication Sig Dispense Refill   albuterol (VENTOLIN HFA) 108 (90 Base) MCG/ACT inhaler INHALE 2 PUFFS BY MOUTH EVERY 4 TO 6 HOURS AS NEEDED (Patient taking differently: Inhale 2 puffs into the lungs every 4 (four) hours as needed for wheezing.) 42.5 g 0   allopurinol (ZYLOPRIM) 300 MG tablet 1 daily (Patient taking differently: Take 300 mg by mouth daily. 1 daily) 90 tablet 0   amitriptyline (ELAVIL) 75 MG tablet TAKE 1 TABLET(75 MG) BY MOUTH AT BEDTIME (Patient taking differently: Take 75 mg by mouth at bedtime.) 90 tablet 3   atorvastatin (LIPITOR) 20 MG tablet TAKE 1 TABLET BY MOUTH EVERY DAY (Patient taking differently: Take 20 mg by mouth daily.) 90 tablet 0   baclofen (LIORESAL) 10 MG tablet Take 1 tablet (10 mg total) by mouth 2 (two) times daily. 30 each 0   betamethasone dipropionate 0.05 % cream Apply 1 application topically daily as needed (irritated skin).     Biotin 3 MG TABS Take 3 mg by mouth daily.     blood glucose meter kit and supplies KIT Dispense based on patient and insurance preference. Use up to four times daily as directed. 1 each 0   bumetanide (BUMEX) 1 MG tablet Take 1 tablet (1 mg total) by mouth 2 (two) times daily. 180 tablet 1   cephALEXin (KEFLEX) 500 MG capsule TAKE 1 CAPSULE(500 MG) BY MOUTH THREE TIMES DAILY (Patient  taking differently: Take 500  mg by mouth 3 (three) times daily.) 90 capsule 3   Cholecalciferol (D3 ADULT PO) Take 1 capsule by mouth daily.     diazepam (VALIUM) 5 MG tablet Take 1 tablet (5 mg total) by mouth daily as needed for anxiety. 3 tablet 0   diclofenac Sodium (VOLTAREN) 1 % GEL Apply 4 g topically 4 (four) times daily. 350 g 1   docusate sodium (COLACE) 100 MG capsule Take 1 capsule (100 mg total) by mouth 3 (three) times daily. (Patient taking differently: Take 100 mg by mouth 2 (two) times daily.) 10 capsule 0   Dulaglutide (TRULICITY) 2.77 AJ/2.8NO SOPN Inject 0.75 mg into the skin once a week. 2 mL 1   DULoxetine (CYMBALTA) 60 MG capsule TAKE 1 CAPSULE(60 MG) BY MOUTH DAILY (Patient taking differently: Take 60 mg by mouth daily.) 90 capsule 1   Fluticasone-Salmeterol (ADVAIR) 250-50 MCG/DOSE AEPB Inhale 1 puff into the lungs every 12 (twelve) hours. 60 each 11   HYDROcodone-acetaminophen (NORCO) 5-325 MG tablet Take 1 tablet by mouth 2 (two) times daily as needed. 20 tablet 0   icosapent Ethyl (VASCEPA) 1 g capsule Take 2 capsules (2 g total) by mouth 2 (two) times daily. 360 capsule 3   ipratropium-albuterol (DUONEB) 0.5-2.5 (3) MG/3ML SOLN Take 3 mLs by nebulization every 6 (six) hours as needed. 360 mL 3   levothyroxine (SYNTHROID) 75 MCG tablet TAKE 1 TABLET(75 MCG) BY MOUTH DAILY BEFORE BREAKFAST (Patient taking differently: Take 75 mcg by mouth daily before breakfast.) 90 tablet 1   lidocaine (LIDODERM) 5 % UNWRAP AND APPLY 2 PATCHES TO SKIN DAILY, REMOVE AND DISCARD PATCH WITHIN 12 HOURS OR AS DIRECTED BY DOCTOR 180 patch 1   magnesium gluconate (MAGONATE) 500 MG tablet Take 2 tablets (1,000 mg total) by mouth at bedtime. 60 tablet 0   metoprolol succinate (TOPROL-XL) 50 MG 24 hr tablet Take 1 tablet (50 mg total) by mouth daily. Take with or immediately following a meal. 30 tablet 2   oxyCODONE-acetaminophen (PERCOCET/ROXICET) 5-325 MG tablet Take 1 tablet by mouth every 6 (six) hours as needed for moderate  pain or severe pain. 5 tablet 0   pantoprazole (PROTONIX) 40 MG tablet Take 1 tablet (40 mg total) by mouth daily at 6 (six) AM. 30 tablet 0   polyethylene glycol powder (GLYCOLAX/MIRALAX) 17 GM/SCOOP powder Take 17 g by mouth daily as needed for moderate constipation.     primidone (MYSOLINE) 50 MG tablet TAKE 1 TABLET BY MOUTH TWICE DAILY (Patient taking differently: Take 50 mg by mouth 2 (two) times daily.) 180 tablet 3   Probiotic Product (PROBIOTIC DAILY PO) Take 1 tablet by mouth daily.     Propylene Glycol (SYSTANE BALANCE OP) Place 1 drop into both eyes 4 (four) times daily as needed (dry eyes).     rOPINIRole (REQUIP) 0.5 MG tablet Take 1.5 tablets (0.75 mg total) by mouth at bedtime. 135 tablet 0   topiramate (TOPAMAX) 25 MG tablet Take 1 tablet (25 mg total) by mouth at bedtime. 30 tablet 11   vitamin B-12 (CYANOCOBALAMIN) 1000 MCG tablet Take 1 tablet (1,000 mcg total) by mouth daily. 30 tablet 0   No current facility-administered medications on file prior to visit.    LABS/IMAGING: No results found for this or any previous visit (from the past 48 hour(s)). No results found.  WEIGHTS: Wt Readings from Last 3 Encounters:  04/22/21 271 lb (122.9 kg)  04/16/21 269 lb (122 kg)  04/01/21  270 lb 12.8 oz (122.8 kg)    VITALS: BP 108/62 (BP Location: Right Arm, Patient Position: Sitting, Cuff Size: Large)    Pulse (!) 124    Ht '5\' 8"'  (1.727 m)    Wt 271 lb (122.9 kg)    BMI 41.21 kg/m   EXAM: General appearance: alert, mild distress, morbidly obese, and on oxygen Neck: no adenopathy, no carotid bruit, no JVD, supple, symmetrical, trachea midline, and thyroid not enlarged, symmetric, no tenderness/mass/nodules Lungs: diminished breath sounds bibasilar Heart: Regular tachycardia Abdomen: Morbidly obese Extremities: edema 2+ bilateral lower extremity Pulses: 2+ and symmetric Skin: Skin color, texture, turgor normal. 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   Brandi Stephens has had some progressive dyspnea on exertion now requiring oxygen.  She has been on progressive increasing doses of diuretic with a rising creatinine, tachycardia, dizziness and hypotension.  I think she is being over diuresed.  She has lower extremity edema which is likely related to poor venous return/venous hypertension given multiple back surgeries and morbid obesity.  I did advise decreasing her max to 1 mg daily from current twice daily dosing.  In addition we will decrease her magnesium to once daily.  She will need repeat lab work next week to reassess creatinine and her magnesium.  We ultimately may need to stop it although she has concerns because she said it was helping with her bowel regularity.  She had some concerns about whether there may be right ventricular dysfunction however echo has failed to show that.  Right heart pressures have been low by echo however at some point may consider work-up for pulmonary hypertension if the pulmonary function testing is unrevealing.  Finally the aorta was noted to be mildly dilated at 41 mm.  This will require follow-up.  Follow-up with me at scheduled appointment in March.  [name], MD, [facility], [place] Director of the Advanced Lipid Disorders &  Cardiovascular [facility] Diplomate of the American Board of Clinical Lipidology Attending Cardiologist  Direct Dial: [phone]   Fax: [phone]  Website:  [email or web address] 04/22/2021, 9:51 AM

## 2021-04-22 NOTE — Patient Instructions (Addendum)
Medication Instructions:  DECREASE bumex to 1 tablet daily DECREASE magnesium to 1 tablet (500mg ) daily   *If you need a refill on your cardiac medications before your next appointment, please call your pharmacy*   Lab Work: CMET, magnesium, lipid on Monday 1/9 or Tuesday 1/10 at Dr. Lysbeth Penner office   If you have labs (blood work) drawn today and your tests are completely normal, you will receive your results only by: [place] (if you have MyChart) OR A paper copy in the mail If you have any lab test that is abnormal or we need to change your treatment, we will call you to review the results.   Testing/Procedures: NONE   Follow-Up: At [facility], you and your health needs are our priority.  As part of our continuing mission to provide you with exceptional heart care, we have created designated Provider Care Teams.  These Care Teams include your primary Cardiologist (physician) and Advanced Practice Providers (APPs -  Physician Assistants and Nurse Practitioners) who all work together to provide you with the care you need, when you need it.  We recommend signing up for the patient portal called "MyChart".  Sign up information is provided on this After Visit Summary.  MyChart is used to connect with patients for Virtual Visits (Telemedicine).  Patients are able to view lab/test results, encounter notes, upcoming appointments, etc.  Non-urgent messages can be sent to your provider as well.   To learn more about what you can do with MyChart, go to [email or web address].    Your next appointment:   July 09, 2021 with Dr. Debara Pickett as scheduled

## 2021-04-23 ENCOUNTER — Other Ambulatory Visit: Payer: Self-pay | Admitting: Internal Medicine

## 2021-04-23 DIAGNOSIS — M4626 Osteomyelitis of vertebra, lumbar region: Secondary | ICD-10-CM

## 2021-04-27 DIAGNOSIS — Z79899 Other long term (current) drug therapy: Secondary | ICD-10-CM | POA: Diagnosis not present

## 2021-04-27 DIAGNOSIS — M109 Gout, unspecified: Secondary | ICD-10-CM | POA: Diagnosis not present

## 2021-04-27 DIAGNOSIS — M48061 Spinal stenosis, lumbar region without neurogenic claudication: Secondary | ICD-10-CM | POA: Diagnosis not present

## 2021-04-27 DIAGNOSIS — M170 Bilateral primary osteoarthritis of knee: Secondary | ICD-10-CM | POA: Diagnosis not present

## 2021-04-27 DIAGNOSIS — G2581 Restless legs syndrome: Secondary | ICD-10-CM | POA: Diagnosis not present

## 2021-04-27 DIAGNOSIS — J9601 Acute respiratory failure with hypoxia: Secondary | ICD-10-CM | POA: Diagnosis not present

## 2021-04-27 DIAGNOSIS — I130 Hypertensive heart and chronic kidney disease with heart failure and stage 1 through stage 4 chronic kidney disease, or unspecified chronic kidney disease: Secondary | ICD-10-CM | POA: Diagnosis not present

## 2021-04-27 DIAGNOSIS — R000 Tachycardia, unspecified: Secondary | ICD-10-CM | POA: Diagnosis not present

## 2021-04-27 DIAGNOSIS — E785 Hyperlipidemia, unspecified: Secondary | ICD-10-CM | POA: Diagnosis not present

## 2021-04-27 DIAGNOSIS — I5032 Chronic diastolic (congestive) heart failure: Secondary | ICD-10-CM | POA: Diagnosis not present

## 2021-04-27 DIAGNOSIS — M47816 Spondylosis without myelopathy or radiculopathy, lumbar region: Secondary | ICD-10-CM | POA: Diagnosis not present

## 2021-04-27 DIAGNOSIS — K219 Gastro-esophageal reflux disease without esophagitis: Secondary | ICD-10-CM | POA: Diagnosis not present

## 2021-04-27 DIAGNOSIS — E039 Hypothyroidism, unspecified: Secondary | ICD-10-CM | POA: Diagnosis not present

## 2021-04-27 DIAGNOSIS — K5909 Other constipation: Secondary | ICD-10-CM | POA: Diagnosis not present

## 2021-04-27 DIAGNOSIS — I872 Venous insufficiency (chronic) (peripheral): Secondary | ICD-10-CM | POA: Diagnosis not present

## 2021-04-27 DIAGNOSIS — E1142 Type 2 diabetes mellitus with diabetic polyneuropathy: Secondary | ICD-10-CM | POA: Diagnosis not present

## 2021-04-27 DIAGNOSIS — Z9981 Dependence on supplemental oxygen: Secondary | ICD-10-CM | POA: Diagnosis not present

## 2021-04-27 DIAGNOSIS — K6282 Dysplasia of anus: Secondary | ICD-10-CM | POA: Diagnosis not present

## 2021-04-27 DIAGNOSIS — J439 Emphysema, unspecified: Secondary | ICD-10-CM | POA: Diagnosis not present

## 2021-04-27 DIAGNOSIS — E782 Mixed hyperlipidemia: Secondary | ICD-10-CM | POA: Diagnosis not present

## 2021-04-27 DIAGNOSIS — E1122 Type 2 diabetes mellitus with diabetic chronic kidney disease: Secondary | ICD-10-CM | POA: Diagnosis not present

## 2021-04-27 DIAGNOSIS — N1831 Chronic kidney disease, stage 3a: Secondary | ICD-10-CM | POA: Diagnosis not present

## 2021-04-27 DIAGNOSIS — G8929 Other chronic pain: Secondary | ICD-10-CM | POA: Diagnosis not present

## 2021-04-27 DIAGNOSIS — E1151 Type 2 diabetes mellitus with diabetic peripheral angiopathy without gangrene: Secondary | ICD-10-CM | POA: Diagnosis not present

## 2021-04-27 DIAGNOSIS — G250 Essential tremor: Secondary | ICD-10-CM | POA: Diagnosis not present

## 2021-04-27 DIAGNOSIS — F418 Other specified anxiety disorders: Secondary | ICD-10-CM | POA: Diagnosis not present

## 2021-04-28 ENCOUNTER — Ambulatory Visit (INDEPENDENT_AMBULATORY_CARE_PROVIDER_SITE_OTHER): Payer: PPO | Admitting: Pulmonary Disease

## 2021-04-28 ENCOUNTER — Other Ambulatory Visit: Payer: Self-pay

## 2021-04-28 ENCOUNTER — Ambulatory Visit: Payer: PPO | Admitting: Pulmonary Disease

## 2021-04-28 ENCOUNTER — Encounter: Payer: Self-pay | Admitting: Pulmonary Disease

## 2021-04-28 VITALS — BP 126/70 | HR 108 | Temp 98.4°F | Ht 68.0 in | Wt 271.0 lb

## 2021-04-28 DIAGNOSIS — J432 Centrilobular emphysema: Secondary | ICD-10-CM | POA: Diagnosis not present

## 2021-04-28 DIAGNOSIS — J431 Panlobular emphysema: Secondary | ICD-10-CM

## 2021-04-28 LAB — PULMONARY FUNCTION TEST
DL/VA % pred: 61 %
DL/VA: 2.48 ml/min/mmHg/L
DLCO cor % pred: 51 %
DLCO cor: 10.96 ml/min/mmHg
DLCO unc % pred: 50 %
DLCO unc: 10.68 ml/min/mmHg
FEF 25-75 Post: 0.87 L/sec
FEF 25-75 Pre: 0.89 L/sec
FEF2575-%Change-Post: -1 %
FEF2575-%Pred-Post: 46 %
FEF2575-%Pred-Pre: 47 %
FEV1-%Change-Post: -3 %
FEV1-%Pred-Post: 64 %
FEV1-%Pred-Pre: 66 %
FEV1-Post: 1.57 L
FEV1-Pre: 1.62 L
FEV1FVC-%Change-Post: -2 %
FEV1FVC-%Pred-Pre: 89 %
FEV6-%Change-Post: 0 %
FEV6-%Pred-Post: 77 %
FEV6-%Pred-Pre: 77 %
FEV6-Post: 2.38 L
FEV6-Pre: 2.37 L
FEV6FVC-%Change-Post: 0 %
FEV6FVC-%Pred-Post: 103 %
FEV6FVC-%Pred-Pre: 102 %
FVC-%Change-Post: 0 %
FVC-%Pred-Post: 74 %
FVC-%Pred-Pre: 74 %
FVC-Post: 2.4 L
FVC-Pre: 2.42 L
Post FEV1/FVC ratio: 65 %
Post FEV6/FVC ratio: 99 %
Pre FEV1/FVC ratio: 67 %
Pre FEV6/FVC Ratio: 98 %
RV % pred: 115 %
RV: 2.79 L
TLC % pred: 95 %
TLC: 5.27 L

## 2021-04-28 LAB — COMPREHENSIVE METABOLIC PANEL
ALT: 21 IU/L (ref 0–32)
AST: 18 IU/L (ref 0–40)
Albumin/Globulin Ratio: 2.3 — ABNORMAL HIGH (ref 1.2–2.2)
Albumin: 4.4 g/dL (ref 3.7–4.7)
Alkaline Phosphatase: 112 IU/L (ref 44–121)
BUN/Creatinine Ratio: 27 (ref 12–28)
BUN: 30 mg/dL — ABNORMAL HIGH (ref 8–27)
Bilirubin Total: 0.2 mg/dL (ref 0.0–1.2)
CO2: 27 mmol/L (ref 20–29)
Calcium: 10.1 mg/dL (ref 8.7–10.3)
Chloride: 103 mmol/L (ref 96–106)
Creatinine, Ser: 1.11 mg/dL — ABNORMAL HIGH (ref 0.57–1.00)
Globulin, Total: 1.9 g/dL (ref 1.5–4.5)
Glucose: 132 mg/dL — ABNORMAL HIGH (ref 70–99)
Potassium: 5 mmol/L (ref 3.5–5.2)
Sodium: 139 mmol/L (ref 134–144)
Total Protein: 6.3 g/dL (ref 6.0–8.5)
eGFR: 52 mL/min/{1.73_m2} — ABNORMAL LOW (ref >59)

## 2021-04-28 LAB — LIPID PANEL
Chol/HDL Ratio: 4.7 ratio — ABNORMAL HIGH (ref 0.0–4.4)
Cholesterol, Total: 196 mg/dL (ref 100–199)
HDL: 42 mg/dL (ref >39)
LDL Chol Calc (NIH): 110 mg/dL — ABNORMAL HIGH (ref 0–99)
Triglycerides: 257 mg/dL — ABNORMAL HIGH (ref 0–149)
VLDL Cholesterol Cal: 44 mg/dL — ABNORMAL HIGH (ref 5–40)

## 2021-04-28 LAB — MAGNESIUM: Magnesium: 2.4 mg/dL — ABNORMAL HIGH (ref 1.6–2.3)

## 2021-04-28 MED ORDER — TRELEGY ELLIPTA 100-62.5-25 MCG/ACT IN AEPB: 1.0000 | INHALATION_SPRAY | Freq: Every day | RESPIRATORY_TRACT | 3 refills | Status: DC

## 2021-04-28 MED ORDER — TRELEGY ELLIPTA 100-62.5-25 MCG/ACT IN AEPB: 1.0000 | INHALATION_SPRAY | Freq: Every day | RESPIRATORY_TRACT | 0 refills | Status: DC

## 2021-04-28 NOTE — Progress Notes (Signed)
Brandi Stephens    [id number]    Mar 27, 1946  Primary Care Physician:Tysinger, Camelia Eng, PA-C  Referring Physician: Carlena Hurl, PA-C [address] [place],  [place] [address]  Chief complaint:   Patient with recurrent bronchitis History of COPD  HPI:  Was recently hospitalized  Getting back to getting stronger Treated for COPD exacerbation, diastolic heart failure  Multiple musculoskeletal limitations  Past history of obstructive lung disease, recently started on Advair but she does not like Advair as much Uses albuterol as needed  Shortness of breath with activities-multifactorial  Quit smoking about a couple years ago  She has very significant/severe back pain-surgery has been canceled on a couple uppercase  She was able to quit smoking for about 8 years in the past  No pertinent occupational history  She was diagnosed with COPD about 2017-at the time she did have sepsis  History of breast cancer for which she received radiation treatments in the past  Outpatient Encounter Medications as of 04/28/2021  Medication Sig   albuterol (VENTOLIN HFA) 108 (90 Base) MCG/ACT inhaler INHALE 2 PUFFS BY MOUTH EVERY 4 TO 6 HOURS AS NEEDED (Patient taking differently: Inhale 2 puffs into the lungs every 4 (four) hours as needed for wheezing.)   allopurinol (ZYLOPRIM) 300 MG tablet 1 daily (Patient taking differently: Take 300 mg by mouth daily. 1 daily)   amitriptyline (ELAVIL) 75 MG tablet TAKE 1 TABLET(75 MG) BY MOUTH AT BEDTIME (Patient taking differently: Take 75 mg by mouth at bedtime.)   atorvastatin (LIPITOR) 20 MG tablet TAKE 1 TABLET BY MOUTH EVERY DAY (Patient taking differently: Take 20 mg by mouth daily.)   baclofen (LIORESAL) 10 MG tablet Take 1 tablet (10 mg total) by mouth 2 (two) times daily.   betamethasone dipropionate 0.05 % cream Apply 1 application topically daily as needed (irritated skin).   Biotin 3 MG TABS Take 3 mg by mouth daily.    blood glucose meter kit and supplies KIT Dispense based on patient and insurance preference. Use up to four times daily as directed.   bumetanide (BUMEX) 1 MG tablet Take 1 mg by mouth daily.   cephALEXin (KEFLEX) 500 MG capsule TAKE 1 CAPSULE(500 MG) BY MOUTH THREE TIMES DAILY   Cholecalciferol (D3 ADULT PO) Take 1 capsule by mouth daily.   diazepam (VALIUM) 5 MG tablet Take 1 tablet (5 mg total) by mouth daily as needed for anxiety.   diclofenac Sodium (VOLTAREN) 1 % GEL Apply 4 g topically 4 (four) times daily.   docusate sodium (COLACE) 100 MG capsule Take 1 capsule (100 mg total) by mouth 3 (three) times daily. (Patient taking differently: Take 100 mg by mouth 2 (two) times daily.)   Dulaglutide (TRULICITY) 0.17 PZ/0.2HE SOPN Inject 0.75 mg into the skin once a week.   DULoxetine (CYMBALTA) 60 MG capsule TAKE 1 CAPSULE(60 MG) BY MOUTH DAILY (Patient taking differently: Take 60 mg by mouth daily.)   Fluticasone-Salmeterol (ADVAIR) 250-50 MCG/DOSE AEPB Inhale 1 puff into the lungs every 12 (twelve) hours.   HYDROcodone-acetaminophen (NORCO) 5-325 MG tablet Take 1 tablet by mouth 2 (two) times daily as needed.   icosapent Ethyl (VASCEPA) 1 g capsule Take 2 capsules (2 g total) by mouth 2 (two) times daily.   ipratropium-albuterol (DUONEB) 0.5-2.5 (3) MG/3ML SOLN Take 3 mLs by nebulization every 6 (six) hours as needed.   levothyroxine (SYNTHROID) 75 MCG tablet TAKE 1 TABLET(75 MCG) BY MOUTH DAILY BEFORE BREAKFAST (Patient  taking differently: Take 75 mcg by mouth daily before breakfast.)   lidocaine (LIDODERM) 5 % UNWRAP AND APPLY 2 PATCHES TO SKIN DAILY, REMOVE AND DISCARD PATCH WITHIN 12 HOURS OR AS DIRECTED BY DOCTOR   magnesium gluconate (MAGONATE) 500 MG tablet Take 500 mg by mouth daily.   metoprolol succinate (TOPROL-XL) 50 MG 24 hr tablet Take 1 tablet (50 mg total) by mouth daily. Take with or immediately following a meal.   oxyCODONE-acetaminophen (PERCOCET/ROXICET) 5-325 MG tablet Take 1  tablet by mouth every 6 (six) hours as needed for moderate pain or severe pain.   pantoprazole (PROTONIX) 40 MG tablet Take 1 tablet (40 mg total) by mouth daily at 6 (six) AM.   polyethylene glycol powder (GLYCOLAX/MIRALAX) 17 GM/SCOOP powder Take 17 g by mouth daily as needed for moderate constipation.   primidone (MYSOLINE) 50 MG tablet TAKE 1 TABLET BY MOUTH TWICE DAILY (Patient taking differently: Take 50 mg by mouth 2 (two) times daily.)   Probiotic Product (PROBIOTIC DAILY PO) Take 1 tablet by mouth daily.   Propylene Glycol (SYSTANE BALANCE OP) Place 1 drop into both eyes 4 (four) times daily as needed (dry eyes).   rOPINIRole (REQUIP) 0.5 MG tablet Take 1.5 tablets (0.75 mg total) by mouth at bedtime.   topiramate (TOPAMAX) 25 MG tablet Take 1 tablet (25 mg total) by mouth at bedtime.   vitamin B-12 (CYANOCOBALAMIN) 1000 MCG tablet Take 1 tablet (1,000 mcg total) by mouth daily.   No facility-administered encounter medications on file as of 04/28/2021.    Allergies as of 04/28/2021 - Review Complete 04/28/2021  Allergen Reaction Noted   Ampicillin Hives and Other (See Comments) 09/30/2015   Gabapentin Swelling 02/12/2019   Penicillins Hives and Other (See Comments) 03/11/2011   Codeine Nausea And Vomiting 03/11/2011   Other Itching 02/26/2018    Past Medical History:  Diagnosis Date   AIN (anal intraepithelial neoplasia) anal canal    Anemia    with pregnancy   Anxiety    Arthritis    knees, lower back, knees   Chronic constipation    Chronic diastolic CHF (congestive heart failure) ([place]) 07/05/2019   CKD (chronic kidney disease)    Coarse tremors    Essential   COPD (chronic obstructive pulmonary disease) ([place])    2017 chest CT, history of   Degenerative lumbar spinal stenosis    Depression    Depression 07/05/2019   Drug dependency ([place])    Elevated PTHrP level 05/09/2019   ETOH abuse 07/05/2019   GERD (gastroesophageal reflux disease)    pt denies   Gout    Grade  I diastolic dysfunction 67/59/1638   Noted ECHO   Hemorrhoids    History of adenomatous polyp of colon    History of basal cell carcinoma (BCC) excision 2015   nose   History of sepsis 05/14/2014   post lumbar surgery   History of shingles    x2   Hyperlipidemia    Hypertension    Hypothyroidism    Irregular heart beat    history of while undergoing radiation   Malignant neoplasm of upper-inner quadrant of left breast in female, estrogen receptor positive [facility]) oncologist-  dr Jana Hakim--  per lov in epic no recurrence   dx 07-13-2015  Left breast invasive ductal carcinoma, Stage IA, Grade 1 (TXN0),  09-16-2015  s/p  left breast lumpectomy with sln bx's,  completed radiation 11-18-2015,  started antiestrogen therapy 12-14-2015   Neuropathy    Left foot and  back of left leg   Obesity (BMI 30-39.9) 07/05/2019   Personal history of radiation therapy    completed 11-18-2015  left breast   Pneumonia    PONV (postoperative nausea and vomiting)    Prediabetes    diet controlled, no med   Restless leg syndrome    Seasonal allergies    Seizure ([place]) 05/2015   due to sepsis, only one time episode   Tremor    Wears partial dentures    lower     Past Surgical History:  Procedure Laterality Date   ABDOMINAL HYSTERECTOMY  1985   BASAL CELL CARCINOMA EXCISION  10/15   nose   BREAST BIOPSY Right 08/24/2015   BREAST BIOPSY Right 07/13/2015   BREAST BIOPSY Left 07/13/2015   BREAST EXCISIONAL BIOPSY Left 1972   BREAST LUMPECTOMY Left    BREAST LUMPECTOMY WITH RADIOACTIVE SEED AND SENTINEL LYMPH NODE BIOPSY Left 09/16/2015   Procedure: BREAST LUMPECTOMY WITH RADIOACTIVE SEED AND SENTINEL LYMPH NODE BIOPSY;  Surgeon: Stark Klein, MD;  Location: [facility];  Service: General;  Laterality: Left;   BREAST REDUCTION SURGERY Bilateral 1998   CATARACT EXTRACTION W/ INTRAOCULAR LENS IMPLANT Left 2016   [place]   hallo   COLONOSCOPY  01/2013   polyps   EYE  SURGERY Left 2016   HAND SURGERY Left 02-19-2002   dr Fredna Dow  '@[facility]'    repair collateral ligament/ MPJ left thumb   HARDWARE REMOVAL N/A 07/24/2019   Procedure: REVISION OF HARDWARE Lumbar two - Sacral one. Extention of Fusion to Lumbar one;  Surgeon: [name], MD;  Location: [facility];  Service: Neurosurgery;  Laterality: N/A;   HEMORRHOID SURGERY  2008   HIGH RESOLUTION ANOSCOPY N/A 02/28/2018   Procedure: HIGH RESOLUTION ANOSCOPY WITH BIOPSY;  Surgeon: Leighton Ruff, MD;  Location: [facility];  Service: General;  Laterality: N/A;   KNEE ARTHROSCOPY Right 2002   LUMBAR SPINE SURGERY  03-20-2015   dr Kathyrn Sheriff   fusion L4-5, L5-S1   MOUTH SURGERY  07/14/2018   2 infected teeth removed   MULTIPLE TOOTH EXTRACTIONS     with bone grafting lower bottom right and left   REDUCTION MAMMAPLASTY Bilateral    RIGHT COLECTOMY  09-10-2003   dr Margot Chimes '@[facility]'    multiple colon polyps (per path tubular adenoma's, hyperplastic , benign appendix, benign two lymph nodes   ROTATOR CUFF REPAIR Left 04/2016   TONSILLECTOMY  1969   TUBAL LIGATION Bilateral 1982    Family History  Problem Relation Age of Onset   Atrial fibrillation Mother    Ulcers Father    Breast cancer Maternal Aunt    Lung cancer Maternal Uncle    Stomach cancer Maternal Grandmother    Lung cancer Maternal Aunt    Brain cancer Maternal Aunt    Prostate cancer Maternal Grandfather    Stroke Paternal Grandmother    Tuberculosis Paternal Grandfather    Colon cancer Neg Hx    Esophageal cancer Neg Hx    Rectal cancer Neg Hx     Social History   Socioeconomic History   Marital status: Married    Spouse name: robert   Number of children: 3   Years of education: college   Highest education level: Not on file  Occupational History   Occupation: retired  Tobacco Use   Smoking status: Former    Packs/day: 0.50    Years: 35.00    Pack years: 17.50  Types: Cigarettes    Quit date: 05/22/2018    Years since  quitting: 2.9   Smokeless tobacco: Never  Vaping Use   Vaping Use: Never used  Substance and Sexual Activity   Alcohol use: Yes    Comment: 1 glass of wine a night   Drug use: Not Currently    Comment: CBD tincture   Sexual activity: Yes    Birth control/protection: Post-menopausal  Other Topics Concern   Not on file  Social History Narrative   Lives with husband in a 2 story home.  Has 2 living children.  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  Education: college.    Right-handed.   Five cups coffee per week.   Social Determinants of Health   Financial Resource Strain: Low Risk    Difficulty of Paying Living Expenses: Not hard at all  Food Insecurity: No Food Insecurity   Worried About Charity fundraiser in the Last Year: Never true   [place] in the Last Year: Never true  Transportation Needs: No Transportation Needs   Lack of Transportation (Medical): No   Lack of Transportation (Non-Medical): No  Physical Activity: Inactive   Days of Exercise per Week: 0 days   Minutes of Exercise per Session: 0 min  Stress: Stress Concern Present   Feeling of Stress : To some extent  Social Connections: Not on file  Intimate Partner Violence: Not on file    Review of Systems  Constitutional: Negative.   HENT: Negative.    Respiratory:  Positive for cough, shortness of breath and wheezing.   Cardiovascular: Negative.   Gastrointestinal: Negative.   Endocrine: Negative.   Vitals:   04/28/21 1205  BP: 126/70  Pulse: (!) 108  Temp: 98.4 F (36.9 C)  SpO2: 92%     Physical Exam Constitutional:      Appearance: She is well-developed.  HENT:     Head: Normocephalic and atraumatic.  Eyes:     General:        Right eye: No discharge.     Conjunctiva/sclera: Conjunctivae normal.     Pupils: Pupils are equal, round, and reactive to light.  Neck:     Thyroid: No thyromegaly.     Trachea: No tracheal deviation.  Cardiovascular:     Rate and Rhythm: Normal  rate and regular rhythm.  Pulmonary:     Effort: Pulmonary effort is normal. No respiratory distress.     Breath sounds: Normal breath sounds. No stridor. No wheezing, rhonchi or rales.  Abdominal:     General: There is no distension.     Palpations: Abdomen is soft.     Tenderness: There is no abdominal tenderness.  Musculoskeletal:     Cervical back: Normal range of motion and neck supple.  Psychiatric:        Mood and Affect: Mood normal.    Data Reviewed: CT scan of the chest reviewed showing atelectasis Chest x-ray reviewed  Spirometry shows no obstruction  Recent hospital CT reviewed showing mild atelectasis, some evidence of emphysema Echocardiogram with normal ejection fraction, diastolic dysfunction  PFT with mild obstructive disease with no significant bronchodilator response -Spirometry is worse than last PFT 3 years ago  Assessment:  COPD -Appears stable at present  Reformed smoker -Has not smoked for couple years  Chronic back pain  History of breast cancer post radiation treatments in the past  Plan/Recommendations:  Graded exercise as tolerated  Will resume [name] 100, 1  puff daily  Encouraged to stay active  Rescue inhaler use as needed  Samples of [name] provided  Sherrilyn Rist MD [facility] 04/28/2021, 12:21 PM  CC: Carlena Hurl, PA-C

## 2021-04-28 NOTE — Patient Instructions (Signed)
Sample of [name]  [name] 100, 1 puff daily Continue rescue albuterol as needed  I will see in about 3 months  Continue graded exercises to build up endurance

## 2021-04-28 NOTE — Progress Notes (Signed)
PFT done today. 

## 2021-04-29 ENCOUNTER — Other Ambulatory Visit: Payer: Self-pay

## 2021-04-29 ENCOUNTER — Telehealth: Payer: Self-pay | Admitting: Pulmonary Disease

## 2021-04-29 ENCOUNTER — Ambulatory Visit (INDEPENDENT_AMBULATORY_CARE_PROVIDER_SITE_OTHER): Payer: PPO | Admitting: Pharmacist

## 2021-04-29 DIAGNOSIS — F320 Major depressive disorder, single episode, mild: Secondary | ICD-10-CM

## 2021-04-29 DIAGNOSIS — I10 Essential (primary) hypertension: Secondary | ICD-10-CM

## 2021-04-29 MED ORDER — METOPROLOL SUCCINATE ER 50 MG PO TB24: 50.0000 mg | ORAL_TABLET | Freq: Every day | ORAL | 2 refills | Status: DC

## 2021-04-29 NOTE — Progress Notes (Signed)
Chronic Care Management Pharmacy Note  05/04/2021 Name:  Brandi Stephens MRN:  [id number] DOB:  [birth year]  Summary: Pt is working on gaining her strength back LDL is not at goal <70 TSH is not optimal for concurrent osteopenia  Recommendations/Changes made from today's visit: -Consider decreasing levothyroxine dose to target higher goal for TSH -Recommend repeat uric acid level -Recommended for pt to take loratadine or cetirizine in place of Benadryl -Recommend increasing dose of ropinirole  -Recommend repeat DEXA  Plan: Fill out PAP for Trulicity Follow up after discussion with PCP   Subjective: Brandi Stephens is an 76 y.o. year old female who is a primary patient of Carlena Hurl, PA-C.  The CCM team was consulted for assistance with disease management and care coordination needs.    Engaged with patient by telephone for initial visit in response to provider referral for pharmacy case management and/or care coordination services.   Consent to Services:  The patient was given the following information about Chronic Care Management services today, agreed to services, and gave verbal consent: 1. CCM service includes personalized support from designated clinical staff supervised by the primary care provider, including individualized plan of care and coordination with other care providers 2. 24/7 contact phone numbers for assistance for urgent and routine care needs. 3. Service will only be billed when office clinical staff spend 20 minutes or more in a month to coordinate care. 4. Only one practitioner may furnish and bill the service in a calendar month. 5.The patient may stop CCM services at any time (effective at the end of the month) by phone call to the office staff. 6. The patient will be responsible for cost sharing (co-pay) of up to 20% of the service fee (after annual deductible is met). Patient agreed to services and consent obtained.  Patient Care Team: Tysinger,  Leward Quan as PCP - General (Family Medicine) Debara Pickett Nadean Corwin, MD as PCP - Cardiology (Cardiology) Magrinat, Virgie Dad, MD as Consulting Physician (Oncology) Stark Klein, MD as Consulting Physician (General Surgery) [name], MD as Consulting Physician (Family Medicine) [name], MD as Consulting Physician (Neurosurgery) Griselda Miner, MD as Consulting Physician (Dermatology) Delice Bison, Charlestine Massed, NP as Nurse Practitioner (Hematology and Oncology) Marchia Bond, MD as Consulting Physician (Orthopedic Surgery) Clydell Hakim, MD (Inactive) as Consulting Physician (Anesthesiology) Armbruster, Carlota Raspberry, MD as Consulting Physician (Gastroenterology) [name], MD as Consulting Physician (Neurosurgery) Marcial Pacas, MD as Consulting Physician (Neurology) Viona Gilmore, [facility] as Pharmacist (Pharmacist)  Recent office visits: 04/16/21 [name], MD - Patient presented via Video Visit for Viral URI and other concerns. Stopped Oseltamivir and Stopped Quetiapine   02/26/21 Tysinger, Camelia Eng, PA-C - Patient presented for weight gain and other concerns. Prescribed Baclofen 10 mg, Dulaglutide 0.75 mg & Hydrocodone Acetaminophen 5-325 mg   02/26/21 [name], LPN - Patient presented for Medicare Annual Wellness Exam. No medication changes.   02/04/21 Tysinger, Camelia Eng, PA-C - Patient presented for Acute respiratory failure with hypoxia and other concerns. No medication changes.   Recent consult visits: 04/28/21 Sherrilyn Rist, MD (pulmonary): Patient presented for centrilobular emphysema follow up. Sampled [name] 1 puff daily and continued albuterol as needed.  04/22/21 Hilty, Nadean Corwin, MD (Cardiology) - Patient presented for Shortness of Breath and other concerns. Decreased Bumetanide to 1 mg once daily. Decreased Magnesium Gluconate to 500 mg daily   04/06/21 [name], MD (Inf Disease) - Patient presented for Influenza A. Prescribed  Oseltamivir  Phosphate   04/01/21 Elgie Collard, PA-C (Cardiology) - Patient presented for Pain and swelling of lower extremity and other concerns. Prescribed Icosapent Ethyl. Stopped Omega -3's   03/29/21 Raulkar, Clide Deutscher, MD (Phys Med) - Patient presented for Idiopathic progressive neuropathy and other concerns. Prescribed Topiramate 25 mg    03/26/21 Raulkar, Clide Deutscher, MD (Phys Med) - Patient presented for Idiopathic progressive neuropathy and other concerns. No medication changes.   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 Stopped Anastrozole 1 mg.    02/04/21 [name], MD (Inf Disease) - Patient presented for Osteomyelitis of lumbar spine. No medication changes.   01/18/21 Henson, Vickie L, PA-C (Fam Med) - Patient presented for Hypoxia and other concerns. Stopped Fluorometholone 0.1%   12/31/20 Magrinat, Virgie Dad, MD (Oncology) - Patient presented for Malignant neoplasm of upper - inner quad of left breast in female. No medication changes.   12/23/20 Kroeger, Lorelee Cover., PA-C (Cardiology) - Patient presented for Bilateral lower extremity edema and other concerns. No medication changes.   12/17/20 Raulkar, Clide Deutscher, MD (Phys Med) - Patient presented for Anxiety and other concerns. Prescribed Ergocalciferol 50000 units.    11/05/20 [name], MD (Vasc Surg) - Patient presented for Pain and swelling of left lower extremity. No medication changes.    11/03/20 [name], MD (Inf Disease) - Patient presented for Acute osteomyelitis of lumbar spine. No medication changes.   Hospital visits: Medication Reconciliation was completed by comparing discharge summary, patients EMR and Pharmacy list, and upon discussion with patient.   Patient presented to [facility] on 01/18/21 for Acute hypoxemic respiratory failure. Patient was present for 8 days.     New?Medications  Started at [facility] Discharge:?? -started blood glucose meter kit and supplies bumetanide (BUMEX) ipratropium-albuterol (DUONEB) oxyCODONE-acetaminophen (PERCOCET/ROXICET) pantoprazole (PROTONIX) Start taking on: January 27, 2021   Medication Changes at Hospital Discharge: -Changed  metoprolol succinate   Medications Discontinued at Hospital Discharge: -Stopped  furosemide 40 MG   Medications that remain the same after Hospital Discharge:??  -All other medications will remain the same.    Objective:  Lab Results  Component Value Date   CREATININE 1.11 (H) 04/27/2021   BUN 30 (H) 04/27/2021   GFRNONAA 38 (L) 01/26/2021   GFRAA 52 (L) 06/04/2020   NA 139 04/27/2021   K 5.0 04/27/2021   CALCIUM 10.1 04/27/2021   CO2 27 04/27/2021   GLUCOSE 132 (H) 04/27/2021    Lab Results  Component Value Date/Time   HGBA1C 7.0 (H) 01/19/2021 04:03 AM   HGBA1C 6.4 (H) 06/04/2020 03:11 PM    Last diabetic Eye exam: No results found for: HMDIABEYEEXA  Last diabetic Foot exam: No results found for: HMDIABFOOTEX   Lab Results  Component Value Date   CHOL 196 04/27/2021   HDL 42 04/27/2021   LDLCALC 110 (H) 04/27/2021   TRIG 257 (H) 04/27/2021   CHOLHDL 4.7 (H) 04/27/2021    Hepatic Function Latest Ref Rng & Units 04/27/2021 04/01/2021 01/19/2021  Total Protein 6.0 - 8.5 g/dL 6.3 7.2 -  Albumin 3.7 - 4.7 g/dL 4.4 4.8(H) 3.7  AST 0 - 40 IU/L 18 23 -  ALT 0 - 32 IU/L 21 21 -  Alk Phosphatase 44 - 121 IU/L 112 111 -  Total Bilirubin 0.0 - 1.2 mg/dL 0.2 0.4 -  Bilirubin, Direct 0.00 - 0.40 mg/dL - - -  Lab Results  Component Value Date/Time   TSH 2.200 04/01/2021 12:42 PM   TSH 4.495 01/19/2021 04:03 AM   TSH 3.530 06/04/2020 03:11 PM   FREET4 1.15 06/04/2020 03:11 PM   FREET4 1.24 01/31/2020 10:40 AM    CBC Latest Ref Rng & Units 04/01/2021 03/23/2021 02/05/2021  WBC 3.4 - 10.8 x10E3/uL 12.0(H) 12.7(H) 11.6(H)  Hemoglobin 11.1 - 15.9 g/dL 12.6 13.0 13.3  Hematocrit 34.0  - 46.6 % 37.7 39.4 40.0  Platelets 150 - 450 x10E3/uL 257 282 250    Lab Results  Component Value Date/Time   VD25OH 32.9 01/31/2020 10:40 AM   VD25OH 30.0 02/12/2019 02:43 PM    Clinical ASCVD: No  The 10-year ASCVD risk score (Arnett DK, et al., 2019) is: 32.8%   Values used to calculate the score:     Age: 24 years     Sex: Female     Is Non-Hispanic African American: No     Diabetic: Yes     Tobacco smoker: No     Systolic Blood Pressure: 381 mmHg     Is BP treated: Yes     HDL Cholesterol: 42 mg/dL     Total Cholesterol: 196 mg/dL    Depression screen [facility] 2/9 02/26/2021 08/07/2020 08/05/2020  Decreased Interest 0 0 0  Down, Depressed, Hopeless 0 0 0  PHQ - 2 Score 0 0 0  Altered sleeping - - -  Tired, decreased energy - - -  Change in appetite - - -  Feeling bad or failure about yourself  - - -  Trouble concentrating - - -  Moving slowly or fidgety/restless - - -  Suicidal thoughts - - -  PHQ-9 Score - - -  Difficult doing work/chores - - -  Some recent data might be hidden     Social History   Tobacco Use  Smoking Status Former   Packs/day: 0.50   Years: 35.00   Pack years: 17.50   Types: Cigarettes   Quit date: 05/22/2018   Years since quitting: 2.9  Smokeless Tobacco Never   BP Readings from Last 3 Encounters:  04/28/21 126/70  04/22/21 108/62  04/01/21 98/60   Pulse Readings from Last 3 Encounters:  04/28/21 (!) 108  04/22/21 (!) 124  04/16/21 (!) 110   Wt Readings from Last 3 Encounters:  04/28/21 271 lb (122.9 kg)  04/22/21 271 lb (122.9 kg)  04/16/21 269 lb (122 kg)   BMI Readings from Last 3 Encounters:  04/28/21 41.21 kg/m  04/22/21 41.21 kg/m  04/16/21 40.90 kg/m    Assessment/Interventions: Review of patient past medical history, allergies, medications, health status, including review of consultants reports, laboratory and other test data, was performed as part of comprehensive evaluation and provision of chronic care management  services.   SDOH:  (Social Determinants of Health) assessments and interventions performed: Yes  SDOH Screenings   Alcohol Screen: Not on file  Depression (PHQ2-9): Low Risk    PHQ-2 Score: 0  Financial Resource Strain: Low Risk    Difficulty of Paying Living Expenses: Not hard at all  Food Insecurity: No Food Insecurity   Worried About Charity fundraiser in the Last Year: Never true   Ran Out of Food in the Last Year: Never true  Housing: Not on file  Physical Activity: Inactive   Days of Exercise per Week: 0 days   Minutes of Exercise per Session: 0 min  Social Connections: Not on file  Stress: Stress Concern Present  Feeling of Stress : To some extent  Tobacco Use: Medium Risk   Smoking Tobacco Use: Former   Smokeless Tobacco Use: Never   Passive Exposure: Not on file  Transportation Needs: No Transportation Needs   Lack of Transportation (Medical): No   Lack of Transportation (Non-Medical): No   Patient is still getting over influenza A that she had over Christmas. She is feeling much better other than the cough. Her daughter and granddaughter and other daughter and fiance all got influenza. Patient has some back pain and had a pulmonary function test because of the cough.  Patient's days are getting better. In the last 6 years she has had 14 surgeries and 6 hospitalizations, 3 of them were back surgeries. The last time she was in the hospital for 47 days and her PCP sent her in due to dehydration and she actually had a sinus infection and she is taking Keflex for this. The lumbar section of her back is metal and in between she had breast cancer and chemo and radiation.  Patient has been in sepsis a couple of times and this was very scary for her. The first time she had to go to rehab afterwards because of everything that happened.   Patient reports it has been one thing after another. Her weight has gone sky high because she is unable to move because of pain. This has been  very depressing for her.  She wants to do gastric bypass but is unsure if she is a candidate or not. She also has edema and she cannot wear regular shoes and this bothers her.   CCM Care Plan  Allergies  Allergen Reactions   Ampicillin Hives and Other (See Comments)    Severe reaction in February 2017 1.5 month to have hives to go away   Gabapentin Swelling    UNSPECIFIED REACTION    Penicillins Hives and Other (See Comments)    Severe reaction in February 2017 1.5 month to have hives to go away Has patient had a PCN reaction causing immediate rash, facial/tongue/throat swelling, SOB or lightheadedness with hypotension: #  #  #  YES  #  #  #  Has patient had a PCN reaction causing severe rash involving mucus membranes or skin necrosis: No Has patient had a PCN reaction that required hospitalization No Has patient had a PCN reaction occurring within the last 10 years: No    Codeine Nausea And Vomiting   Other Itching    UNSPECIFIED Analgesics    Medications Reviewed Today     Reviewed by Viona Gilmore, [facility] [place] (Pharmacist) on 04/29/21 at 1451  Med List Status: <None>   Medication Order Taking? Sig Documenting Provider Last Dose Status Informant  albuterol (VENTOLIN HFA) 108 (90 Base) MCG/ACT inhaler [id number] Yes INHALE 2 PUFFS BY MOUTH EVERY 4 TO 6 HOURS AS NEEDED  Patient taking differently: Inhale 2 puffs into the lungs every 4 (four) hours as needed for wheezing.   Cathlyn Parsons, PA-C Taking Active   allopurinol (ZYLOPRIM) 300 MG tablet [id number] Yes 1 daily  Patient taking differently: Take 300 mg by mouth daily. 1 daily   Henson, Vickie L, PA-C Taking Active   amitriptyline (ELAVIL) 75 MG tablet [id number] Yes TAKE 1 TABLET(75 MG) BY MOUTH AT BEDTIME  Patient taking differently: Take 75 mg by mouth at bedtime.   Izora Ribas, MD Taking Active   atorvastatin (LIPITOR) 20 MG tablet [id number] Yes TAKE 1 TABLET BY MOUTH EVERY DAY  Patient taking differently: Take 20  mg by mouth daily.   Henson, Vickie L, PA-C Taking Active   baclofen (LIORESAL) 10 MG tablet [id number] Yes Take 1 tablet (10 mg total) by mouth 2 (two) times daily. Tysinger, Camelia Eng, PA-C Taking Active   betamethasone dipropionate 0.05 % cream [id number]  Apply 1 application topically daily as needed (irritated skin). [provider]  Active Self  Biotin 3 MG TABS [id number] Yes Take 3 mg by mouth daily. [provider] Taking Active Self  blood glucose meter kit and supplies KIT [id number]  Dispense based on patient and insurance preference. Use up to four times daily as directed. Hosie Poisson, MD  Active   bumetanide (BUMEX) 1 MG tablet [id number] Yes Take 1 mg by mouth daily. [provider] Taking Active   cephALEXin (KEFLEX) 500 MG capsule [id number] Yes TAKE 1 CAPSULE(500 MG) BY MOUTH THREE TIMES DAILY [name], MD Taking Active   Cholecalciferol (D3 ADULT PO) [id number] Yes Take 2,000 Units by mouth daily. [provider] Taking Active Self  diazepam (VALIUM) 5 MG tablet [id number] Yes Take 1 tablet (5 mg total) by mouth daily as needed for anxiety. Hosie Poisson, MD Taking Active   diclofenac Sodium (VOLTAREN) 1 % GEL [id number] Yes Apply 4 g topically 4 (four) times daily. [name], MD Taking Active Self  docusate sodium (COLACE) 100 MG capsule [id number] Yes Take 1 capsule (100 mg total) by mouth 3 (three) times daily.  Patient taking differently: Take 100 mg by mouth 2 (two) times daily.   Cathlyn Parsons, PA-C Taking Active   Dulaglutide (TRULICITY) 0.97 DZ/3.2DJ SOPN [id number] Yes Inject 0.75 mg into the skin once a week. Tysinger, Camelia Eng, PA-C Taking Active   DULoxetine (CYMBALTA) 60 MG capsule [id number] Yes TAKE 1 CAPSULE(60 MG) BY MOUTH DAILY  Patient taking differently: Take 60 mg by mouth daily.   Henson, Vickie L, PA-C Taking Active   Fluticasone-Umeclidin-Vilant ([name] ELLIPTA) 100-62.5-25 MCG/ACT AEPB [id number] Yes Inhale 1 puff  into the lungs daily. Laurin Coder, MD Taking Active   Fluticasone-Umeclidin-Vilant ([name] ELLIPTA) 100-62.5-25 MCG/ACT AEPB [id number]  Inhale 1 puff into the lungs daily. Laurin Coder, MD  Active   HYDROcodone-acetaminophen (NORCO) 5-325 MG tablet [id number] Yes Take 1 tablet by mouth 2 (two) times daily as needed. Tysinger, Camelia Eng, PA-C Taking Active   levothyroxine (SYNTHROID) 75 MCG tablet [id number] Yes TAKE 1 TABLET(75 MCG) BY MOUTH DAILY BEFORE BREAKFAST  Patient taking differently: Take 75 mcg by mouth daily before breakfast.   [name], Vickie L, PA-C Taking Active   magnesium gluconate (MAGONATE) 500 MG tablet [id number] Yes Take 500 mg by mouth daily. [provider] Taking Active   metoprolol succinate (TOPROL-XL) 50 MG 24 hr tablet [id number] Yes Take 1 tablet (50 mg total) by mouth daily. Take with or immediately following a meal. Hosie Poisson, MD Taking Active   polyethylene glycol powder (GLYCOLAX/MIRALAX) 17 GM/SCOOP powder [id number] Yes Take 17 g by mouth daily as needed for moderate constipation. [provider] Taking Active Self  primidone (MYSOLINE) 50 MG tablet [id number] Yes TAKE 1 TABLET BY MOUTH TWICE DAILY  Patient taking differently: Take 50 mg by mouth 2 (two) times daily.   Izora Ribas, MD Taking Active   Probiotic Product (PROBIOTIC DAILY PO) [id number] Yes Take 1 tablet by mouth daily. [provider] Taking Active Self  Propylene Glycol (SYSTANE BALANCE OP) [id number] Yes Place 1 drop into both eyes 4 (four) times daily  as needed (dry eyes). [provider] Taking Active Self  rOPINIRole (REQUIP) 0.5 MG tablet [id number] Yes Take 1.5 tablets (0.75 mg total) by mouth at bedtime. Tysinger, Camelia Eng, PA-C Taking Active   topiramate (TOPAMAX) 25 MG tablet [id number] Yes Take 1 tablet (25 mg total) by mouth at bedtime. Izora Ribas, MD Taking Active   vitamin B-12 (CYANOCOBALAMIN) 1000 MCG tablet [id number] Yes Take 1  tablet (1,000 mcg total) by mouth daily. Cathlyn Parsons, PA-C Taking Active Self            Patient Active Problem List   Diagnosis Date Noted   Influenza A 04/06/2021   Weight gain 02/26/2021   Edema 02/26/2021   Tobacco use 02/04/2021   Morbid obesity ([place])    Acute respiratory failure with hypoxia ([place]) 01/20/2021   Acute hypoxemic respiratory failure ([place]) 01/18/2021   Short-term memory loss 02/24/2020   Peripheral edema 02/24/2020   Osteopenia 01/22/2020   Aortic atherosclerosis ([place]) 01/22/2020   Wheezing 01/22/2020   Insomnia 10/29/2019   Recurrent falls 09/06/2019   Confusion, postoperative    Benign essential HTN    Post-operative pain    Chronic constipation    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   Post op infection 07/11/2019   Muscle spasm    Hypokalemia    Status post lumbar spinal fusion 07/10/2019   Postoperative pain    Acute blood loss anemia    Labile blood pressure    Tobacco abuse    Hypoalbuminemia due to protein-calorie malnutrition ([place])    Anxiety 07/05/2019   Depression 07/05/2019   Chronic diastolic CHF (congestive heart failure) ([place]) 07/05/2019   Obesity (BMI 30-39.9) 07/05/2019   ETOH abuse 07/05/2019   Lumbar radiculopathy 06/28/2019   Spinal stenosis of lumbar region 06/28/2019   Acute on chronic anemia    Tremors of nervous system    AKI (acute kidney injury) ([place])    Chronic pain syndrome    Acute renal failure superimposed on stage 3 chronic kidney disease ([place]) 06/25/2019   Osteomyelitis of lumbar spine ([place]) 06/25/2019   Muscular deconditioning    Abdominal pain 06/24/2019   Nausea 06/24/2019   Abnormal urine odor 06/24/2019   History of sepsis 06/24/2019   Recent major surgery 06/24/2019   History of diverticulosis 06/24/2019   Elevated PTHrP level 05/09/2019   Paresthesia 02/12/2019   Low back pain 02/12/2019   Neuropathy 12/20/2018   Lumbar spinal stenosis 06/29/2018   Preoperative clearance 06/04/2018    Abnormal CT of the chest 06/04/2018   Hypothyroidism 11/30/2017   Thoracic aortic atherosclerosis ([place]) 09/14/2016   Benign essential tremor 09/09/2016   RLS (restless legs syndrome) 09/09/2016   H/O adenomatous polyp of colon 07/06/2016   PVC's (premature ventricular contractions) 03/29/2016   Preoperative cardiovascular examination 03/29/2016   Grade I diastolic dysfunction 70/62/3762   Palpitations 01/11/2016   Shortness of breath 01/11/2016   Former cigarette smoker 01/11/2016   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   Leg edema 01/11/2016   Chronic cough 10/28/2015   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   Malignant neoplasm of upper-inner quadrant of left breast in female, estrogen receptor positive ([place]) 07/14/2015   Prediabetes 07/08/2015   CKD (chronic kidney disease), stage III 07/08/2015   Dependent edema 06/08/2015   Sleep disturbance 06/08/2015   Mixed hyperlipidemia 06/08/2015   Generalized anxiety disorder 06/08/2015   Essential hypertension 06/08/2015   COPD (chronic obstructive pulmonary disease) ([place]) 06/08/2015   Gout 06/08/2015   Macrocytic anemia    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[place])    Sepsis ([place]) 05/15/2015   Lumbar spondylosis 03/20/2015    Immunization History  Administered Date(s) Administered   Fluad Quad(high Dose 65+) 12/20/2018, 01/22/2020   Influenza, High Dose Seasonal PF 05/01/2017   Influenza-Unspecified 12/17/2013, 12/12/2017   PFIZER Comirnaty([place])Covid-19 Tri-Sucrose Vaccine 09/20/2019, 10/22/2019   Pneumococcal Conjugate-13 01/14/2014   Pneumococcal Polysaccharide-23 07/06/2016   Tdap 07/02/2013   Zoster Recombinat (Shingrix) 12/12/2017, 01/04/2019    Conditions to be addressed/monitored:  Hypertension, Hyperlipidemia, Diabetes, Heart Failure, COPD, Hypothyroidism, Osteopenia, Gout, and Insomnia, Neuropathy  Care Plan : [place]  Updates made by Viona Gilmore, [place] since 05/04/2021 12:00 AM     Problem:  Problem: Hypertension, Hyperlipidemia, Diabetes, Heart Failure, COPD, Hypothyroidism, Osteopenia, Gout, and Insomnia, Neuropathy      Long-Range Goal: Patient-Specific Goal   Start Date: 04/29/2021  Expected End Date: 04/29/2022  This Visit's Progress: On track  Priority: High  Note:   Current Barriers:  Unable to independently monitor therapeutic efficacy Unable to achieve control of cholesterol   Pharmacist Clinical Goal(s):  Patient will achieve adherence to monitoring guidelines and medication adherence to achieve therapeutic efficacy achieve control of cholesterol as evidenced by next lipid panel  through collaboration with PharmD and provider.   Interventions: 1:1 collaboration with Tysinger, Camelia Eng, PA-C regarding development and update of comprehensive plan of care as evidenced by provider attestation and co-signature Inter-disciplinary care team collaboration (see longitudinal plan of care) Comprehensive medication review performed; medication list updated in electronic medical record  Hypertension (BP goal <130/80) -Controlled -Current treatment: Metoprolol succinate 50 mg 1 tablet daily - Appropriate, Effective, Safe, Accessible -Medications previously tried: unknown  -Current home readings: 104/68 (has a cuff) - PT and nurse coming out and checking -Current dietary habits: limits salt intake -Current exercise habits: minimal due to pain -Denies hypotensive/hypertensive symptoms -Educated on BP goals and benefits of medications for prevention of heart attack, stroke and kidney damage; Importance of home blood pressure monitoring; Proper BP monitoring technique; -Counseled to monitor BP at home weekly, document, and provide log at future appointments -Counseled on diet and exercise extensively Recommended to continue current medication  Hyperlipidemia: (LDL goal < 70) -Uncontrolled -Current treatment: Atorvastatin 20 mg 1 tablet daily - Appropriate, Query effective,  Safe, Accessible Cod liver oil 2 capsules 3 times a day (415 mg; EPA 37 mg & DHA 36 mg)  - Appropriate, Query effective, Safe, Accessible -Medications previously tried: Optometrist (cost)  -Current dietary patterns: doesn't eat fried foods - usually baked or broiled; olive oil; drinks a lot of water not doing much carbs (1 glass of wine every night) -Current exercise habits: minimal due to pain -Educated on Cholesterol goals;  Benefits of statin for ASCVD risk reduction; Importance of limiting foods high in cholesterol; Exercise goal of 150 minutes per week; -Counseled on diet and exercise extensively Recommended to continue current medication Recommended increasing atorvastatin Consider switching to Vascepa for triglyceride lowering.   Diabetes (A1c goal <7%) -Not ideally controlled -Current medications: Trulicity 2.13 mg inject once weekly - Appropriate, Query effective, Safe, Query accessible -Medications previously tried: none  -Current home glucose readings fasting glucose: not checking post prandial glucose: not checking -Denies hypoglycemic/hyperglycemic symptoms -Current meal patterns:  breakfast: did not discuss  lunch: did not discuss   dinner: did not discuss  snacks: did not discuss  drinks: did not discuss  -Current exercise: unable to with pain -Educated on A1c and blood sugar goals; Complications of diabetes including kidney  damage, retinal damage, and cardiovascular disease; -Counseled to check feet daily and get yearly eye exams -Recommended to continue current medication Assessed patient finances. Plan to apply for Trulicity PAP.  Heart Failure (Goal: manage symptoms and prevent exacerbations) -Controlled -Last ejection fraction: 65-70% (Date: 3/22) -HF type: Diastolic -NYHA Class: II (slight limitation of activity) -AHA HF Stage: A (HF risk factors present) -Current treatment: Bumetanide 1 mg daily - Appropriate, Effective, Safe, Accessible -Medications  previously tried: furosemide  -Current home BP/HR readings: refer to above -Current dietary habits: doesn't use any salt and uses Mrs. Dash; only eats home cooking and loves vegetables (usually fresh); roasted vegetables; seafood and some beef or steak and doesn't -Current exercise habits: minimal with pain; daggers in her back when walking or standing -Educated on Importance of weighing daily; if you gain more than 3 pounds in one day or 5 pounds in one week, call cardiology. Proper diuretic administration and potassium supplementation Importance of blood pressure control -Counseled on diet and exercise extensively Recommended to continue current medication  COPD (Goal: control symptoms and prevent exacerbations) -Controlled -Current treatment  [name] 100-62.5-25 mcg/act 1 puff daily - Appropriate, Effective, Safe, Query accessible Albuterol as needed - Appropriate, Effective, Safe, Accessible -Medications previously tried: Advair (doesn't care for diskus)  -Medications previously tried: none  -Gold Grade: Gold 2 (FEV1 50-79%) -Current COPD Classification:  B (high sx, <2 exacerbations/yr) -MMRC/CAT score: n/a -Pulmonary function testing: 04/28/21 -Exacerbations requiring treatment in last 6 months: none -Patient reports consistent use of maintenance inhaler -Frequency of rescue inhaler use: not often (using oxygen constantly) -Counseled on Proper inhaler technique; Benefits of consistent maintenance inhaler use When to use rescue inhaler Differences between maintenance and rescue inhalers -Recommended to continue current medication  Depression/Anxiety/Insomnia (Goal: minimize symptoms and improve sleep) -Controlled -Current treatment: Duloxetine 60 mg 1 capsule daily - Appropriate, Effective, Safe, Accessible Diazepam 5 mg 1 tablet as needed (uses sparingly) - Appropriate, Effective, Safe, Accessible Amitriptyline 75 mg 1 tablet daily at bedtime - Appropriate, Effective, Safe,  Accessible -Medications previously tried/failed: Seroquel (sleep) -PHQ9: 0 -GAD7: n/a -Educated on Benefits of medication for symptom control Benefits of cognitive-behavioral therapy with or without medication -Recommended to continue current medication Recommended for patient to think about counseling.  Osteopenia (Goal prevent fractures) -Controlled -Last DEXA Scan: 11/2016   T-Score femoral neck: -2.0  T-Score total hip: n/a  T-Score lumbar spine: 0.9  T-Score forearm radius: n/a  10-year probability of major osteoporotic fracture: n/a  10-year probability of hip fracture: n/a -Patient is not a candidate for pharmacologic treatment -Current treatment  Vitamin D 50 mcg (2000 units daily) - Appropriate, Effective, Safe, Accessible -Medications previously tried: none  -Recommend [phone] units of vitamin D daily. Recommend 1200 mg of calcium daily from dietary and supplemental sources. Recommend weight-bearing and muscle strengthening exercises for building and maintaining bone density. -Recommended to continue current medication Patient is getting adequate calcium from dietary intake.  Hypothyroidism (Goal: TSH 2.5-4.5) -Not ideally controlled -Current treatment  Levothyroxine 75 mcg 1 tablet daily before breakfast - Appropriate, Effective, Query Safe, Accessible -Medications previously tried: none  - Consider decreasing levothyroxine dose given concurrent osteopenia.  Gout (Goal: uric acid < 6 and prevent flare ups) -Not ideally controlled -Current treatment  Allopurinol 300 mg 1 tablet daily - Appropriate, Query effective, Safe, Accessible Colchicine as needed - Appropriate, Effective, Safe, Accessible -Medications previously tried: none  -Recommended repeat uric acid level and consider increasing.  Pain/muscle spasms (Goal: minimize pain/muscle spasms) -Controlled -Current treatment  Hydrocodone-PAP 5-325 mg 1 tablet twice daily as needed (taking sparingly) -  Appropriate, Effective, Safe, Accessible Tylenol 650 mg 1 tablet as needed - Appropriate, Effective, Safe, Accessible Diclofenac gel as needed (at least once a day) - Appropriate, Effective, Safe, Accessible Baclofen 10 mg 1 tablet twice daily as needed -Appropriate, Effective, Safe, Accessible -Medications previously tried: Advil (doesn't use due to hyponatremia)  -Recommended to continue current medication  Restless legs syndrome (Goal: minimize symptoms) -Not ideally controlled -Current treatment  Ropinirole 0.5 mg 1.5 tablets daily - Appropriate, Query effective, Safe, Accessible -Medications previously tried: none  -Collaborated with PCP to consider dose increase.  Peripheral neuropathy (Goal: minimize symptoms) -Controlled -Current treatment  Topamax 25 mg 1 tablet at bedtime - Appropriate, Effective, Safe, Accessible Duloxetine 60 mg 1 capsule daily - Appropriate, Effective, Safe, Accessible -Medications previously tried: none  -Recommended to continue current medication   Health Maintenance -Vaccine gaps: COVID booster -Current therapy:  Magnesium gluconate 500 mg daily Docusate 100 mg 1 tablet twice daily Cephalexin 500 mg 1 capsule three times daily Vitamin B12 1000 mcg 1 tablet daily Probiotic daily Miralax as needed Systane as needed Biotin 3 mg daily (helps with hair) Benadryl as needed -Educated on Cost vs benefit of each product must be carefully weighed by individual consumer Supplements may interfere with prescription drugs -Patient is satisfied with current therapy and denies issues -Recommended switching Benadryl to cetirizine or loratadine due to less sedation.  Patient Goals/Self-Care Activities Patient will:  - take medications as prescribed as evidenced by patient report and record review  Follow Up Plan: The care management team will reach out to the patient again over the next 30 days.      Medication Assistance: None required.  Patient affirms  current coverage meets needs.  Compliance/Adherence/Medication fill history: Care Gaps: Urine microalbumin, COVID booster, colonoscopy Last A1c: 7.0% (01/19/21) BP- 108/62 (Office 04/22/21)  Star-Rating Drugs: Atorvastatin 20 mg - Last filled 01/08/21 90 DS at [facility] Dulaglutide (Trulicity) 1.61 mg - Last filled 03/24/21 28 DS at [facility] < 5 days did not verify  Patient's preferred pharmacy is:  [id number] - [address], [address] [place] [address] Phone: [phone] Fax: [phone]  Uses pill box? Yes - 4 weeks  Pt endorses 95% compliance  We discussed: Benefits of medication synchronization, packaging and delivery as well as enhanced pharmacist oversight with [employer]. Patient decided to: Continue current medication management strategy  Care Plan and Follow Up Patient Decision:  Patient agrees to Care Plan and Follow-up.  Plan: The care management team will reach out to the patient again over the next 30 days.  Jeni Salles, PharmD, [facility] [phone]

## 2021-04-29 NOTE — Telephone Encounter (Signed)
I left a message for the patient to call back about any concerns of how to use the [name] inhaler.

## 2021-04-30 ENCOUNTER — Other Ambulatory Visit: Payer: Self-pay | Admitting: *Deleted

## 2021-04-30 DIAGNOSIS — E782 Mixed hyperlipidemia: Secondary | ICD-10-CM

## 2021-04-30 MED ORDER — ATORVASTATIN CALCIUM 80 MG PO TABS: 80.0000 mg | ORAL_TABLET | Freq: Every day | ORAL | 3 refills | Status: DC

## 2021-04-30 NOTE — Telephone Encounter (Signed)
I called the pt directly and she did not answer-LMTCB   Called her home health nurse, Stanton Kidney, and she stated that pt was never told how many puffs per day to use her trelegy. I advised that it should be 1 puff daily and this is the pt's AVS that was given to her. Nothing further needed.

## 2021-05-04 ENCOUNTER — Telehealth: Payer: Self-pay

## 2021-05-04 NOTE — Telephone Encounter (Signed)
-----   Message from Viona Gilmore, [facility] sent at 05/04/2021  2:35 PM EST ----- Regarding: Baclofen refill Hi,  When I spoke with Ms. Eugenio she was running low on the baclofen and requested a refill. Can this please be sent to her [place]?  Thank you! Maddie

## 2021-05-04 NOTE — Progress Notes (Signed)
Per Jeni Salles patient assistance application completed for Trulicuty to be mailed to patient by Judi Saa CMA Clinical Pharmacist Assistant [phone]

## 2021-05-04 NOTE — Patient Instructions (Signed)
Hi Brandi Stephens,  It was great to get to meet you over the telephone! Below is a summary of some of the topics we discussed.   I will give you a call once I have a chance to talk with Brandi Stephens.  Please be on the lookout for the Trulicity patient assistance paperwork as well, like we discussed.  Please reach out to me if you have any questions or need anything!  Best, Maddie  Jeni Salles, PharmD, [facility] [phone]   Visit Information   Goals Addressed   None    Patient Care Plan: CCM Pharmacy Care Plan     Problem Identified: Problem: Hypertension, Hyperlipidemia, Diabetes, Heart Failure, COPD, Hypothyroidism, Osteopenia, Gout, and Insomnia, Neuropathy      Long-Range Goal: Patient-Specific Goal   Start Date: 04/29/2021  Expected End Date: 04/29/2022  This Visit's Progress: On track  Priority: High  Note:   Current Barriers:  Unable to independently monitor therapeutic efficacy Unable to achieve control of cholesterol   Pharmacist Clinical Goal(s):  Patient will achieve adherence to monitoring guidelines and medication adherence to achieve therapeutic efficacy achieve control of cholesterol as evidenced by next lipid panel  through collaboration with PharmD and provider.   Interventions: 1:1 collaboration with Tysinger, Camelia Eng, PA-C regarding development and update of comprehensive plan of care as evidenced by provider attestation and co-signature Inter-disciplinary care team collaboration (see longitudinal plan of care) Comprehensive medication review performed; medication list updated in electronic medical record  Hypertension (BP goal <130/80) -Controlled -Current treatment: Metoprolol succinate 50 mg 1 tablet daily - Appropriate, Effective, Safe, Accessible -Medications previously tried: unknown  -Current home readings: 104/68 (has a cuff) - PT and nurse coming out and checking -Current dietary habits: limits salt  intake -Current exercise habits: minimal due to pain -Denies hypotensive/hypertensive symptoms -Educated on BP goals and benefits of medications for prevention of heart attack, stroke and kidney damage; Importance of home blood pressure monitoring; Proper BP monitoring technique; -Counseled to monitor BP at home weekly, document, and provide log at future appointments -Counseled on diet and exercise extensively Recommended to continue current medication  Hyperlipidemia: (LDL goal < 70) -Uncontrolled -Current treatment: Atorvastatin 20 mg 1 tablet daily - Appropriate, Query effective, Safe, Accessible Cod liver oil 2 capsules 3 times a day (415 mg; EPA 37 mg & DHA 36 mg)  - Appropriate, Query effective, Safe, Accessible -Medications previously tried: Optometrist (cost)  -Current dietary patterns: doesn't eat fried foods - usually baked or broiled; olive oil; drinks a lot of water not doing much carbs (1 glass of wine every night) -Current exercise habits: minimal due to pain -Educated on Cholesterol goals;  Benefits of statin for ASCVD risk reduction; Importance of limiting foods high in cholesterol; Exercise goal of 150 minutes per week; -Counseled on diet and exercise extensively Recommended to continue current medication Recommended increasing atorvastatin Consider switching to Vascepa for triglyceride lowering.   Diabetes (A1c goal <7%) -Not ideally controlled -Current medications: Trulicity 4.96 mg inject once weekly - Appropriate, Query effective, Safe, Query accessible -Medications previously tried: none  -Current home glucose readings fasting glucose: not checking post prandial glucose: not checking -Denies hypoglycemic/hyperglycemic symptoms -Current meal patterns:  breakfast: did not discuss  lunch: did not discuss   dinner: did not discuss  snacks: did not discuss  drinks: did not discuss  -Current exercise: unable to with pain -Educated on A1c and blood sugar  goals; Complications of diabetes including kidney damage, retinal damage, and cardiovascular  disease; -Counseled to check feet daily and get yearly eye exams -Recommended to continue current medication Assessed patient finances. Plan to apply for Trulicity PAP.  Heart Failure (Goal: manage symptoms and prevent exacerbations) -Controlled -Last ejection fraction: 65-70% (Date: 3/22) -HF type: Diastolic -NYHA Class: II (slight limitation of activity) -AHA HF Stage: A (HF risk factors present) -Current treatment: Bumetanide 1 mg daily - Appropriate, Effective, Safe, Accessible -Medications previously tried: furosemide  -Current home BP/HR readings: refer to above -Current dietary habits: doesn't use any salt and uses Mrs. Dash; only eats home cooking and loves vegetables (usually fresh); roasted vegetables; seafood and some beef or steak and doesn't -Current exercise habits: minimal with pain; daggers in her back when walking or standing -Educated on Importance of weighing daily; if you gain more than 3 pounds in one day or 5 pounds in one week, call cardiology. Proper diuretic administration and potassium supplementation Importance of blood pressure control -Counseled on diet and exercise extensively Recommended to continue current medication  COPD (Goal: control symptoms and prevent exacerbations) -Controlled -Current treatment  [name] 100-62.5-25 mcg/act 1 puff daily - Appropriate, Effective, Safe, Query accessible Albuterol as needed - Appropriate, Effective, Safe, Accessible -Medications previously tried: Advair (doesn't care for diskus)  -Medications previously tried: none  -Gold Grade: Gold 2 (FEV1 50-79%) -Current COPD Classification:  B (high sx, <2 exacerbations/yr) -MMRC/CAT score: n/a -Pulmonary function testing: 04/28/21 -Exacerbations requiring treatment in last 6 months: none -Patient reports consistent use of maintenance inhaler -Frequency of rescue inhaler use: not  often (using oxygen constantly) -Counseled on Proper inhaler technique; Benefits of consistent maintenance inhaler use When to use rescue inhaler Differences between maintenance and rescue inhalers -Recommended to continue current medication  Depression/Anxiety/Insomnia (Goal: minimize symptoms and improve sleep) -Controlled -Current treatment: Duloxetine 60 mg 1 capsule daily - Appropriate, Effective, Safe, Accessible Diazepam 5 mg 1 tablet as needed (uses sparingly) - Appropriate, Effective, Safe, Accessible Amitriptyline 75 mg 1 tablet daily at bedtime - Appropriate, Effective, Safe, Accessible -Medications previously tried/failed: Seroquel (sleep) -PHQ9: 0 -GAD7: n/a -Educated on Benefits of medication for symptom control Benefits of cognitive-behavioral therapy with or without medication -Recommended to continue current medication Recommended for patient to think about counseling.  Osteopenia (Goal prevent fractures) -Controlled -Last DEXA Scan: 11/2016   T-Score femoral neck: -2.0  T-Score total hip: n/a  T-Score lumbar spine: 0.9  T-Score forearm radius: n/a  10-year probability of major osteoporotic fracture: n/a  10-year probability of hip fracture: n/a -Patient is not a candidate for pharmacologic treatment -Current treatment  Vitamin D 50 mcg (2000 units daily) - Appropriate, Effective, Safe, Accessible -Medications previously tried: none  -Recommend [phone] units of vitamin D daily. Recommend 1200 mg of calcium daily from dietary and supplemental sources. Recommend weight-bearing and muscle strengthening exercises for building and maintaining bone density. -Recommended to continue current medication Patient is getting adequate calcium from dietary intake.  Hypothyroidism (Goal: TSH 2.5-4.5) -Not ideally controlled -Current treatment  Levothyroxine 75 mcg 1 tablet daily before breakfast - Appropriate, Effective, Query Safe, Accessible -Medications previously  tried: none  - Consider decreasing levothyroxine dose given concurrent osteopenia.  Gout (Goal: uric acid < 6 and prevent flare ups) -Not ideally controlled -Current treatment  Allopurinol 300 mg 1 tablet daily - Appropriate, Query effective, Safe, Accessible Colchicine as needed - Appropriate, Effective, Safe, Accessible -Medications previously tried: none  -Recommended repeat uric acid level and consider increasing.  Pain/muscle spasms (Goal: minimize pain/muscle spasms) -Controlled -Current treatment  Hydrocodone-PAP 5-325 mg 1  tablet twice daily as needed (taking sparingly) - Appropriate, Effective, Safe, Accessible Tylenol 650 mg 1 tablet as needed - Appropriate, Effective, Safe, Accessible Diclofenac gel as needed (at least once a day) - Appropriate, Effective, Safe, Accessible Baclofen 10 mg 1 tablet twice daily as needed -Appropriate, Effective, Safe, Accessible -Medications previously tried: Advil (doesn't use due to hyponatremia)  -Recommended to continue current medication  Restless legs syndrome (Goal: minimize symptoms) -Not ideally controlled -Current treatment  Ropinirole 0.5 mg 1.5 tablets daily - Appropriate, Query effective, Safe, Accessible -Medications previously tried: none  -Collaborated with PCP to consider dose increase.  Peripheral neuropathy (Goal: minimize symptoms) -Controlled -Current treatment  Topamax 25 mg 1 tablet at bedtime - Appropriate, Effective, Safe, Accessible Duloxetine 60 mg 1 capsule daily - Appropriate, Effective, Safe, Accessible -Medications previously tried: none  -Recommended to continue current medication   Health Maintenance -Vaccine gaps: COVID booster -Current therapy:  Magnesium gluconate 500 mg daily Docusate 100 mg 1 tablet twice daily Cephalexin 500 mg 1 capsule three times daily Vitamin B12 1000 mcg 1 tablet daily Probiotic daily Miralax as needed Systane as needed Biotin 3 mg daily (helps with hair) Benadryl as  needed -Educated on Cost vs benefit of each product must be carefully weighed by individual consumer Supplements may interfere with prescription drugs -Patient is satisfied with current therapy and denies issues -Recommended switching Benadryl to cetirizine or loratadine due to less sedation.  Patient Goals/Self-Care Activities Patient will:  - take medications as prescribed as evidenced by patient report and record review  Follow Up Plan: The care management team will reach out to the patient again over the next 30 days.       Ms. Ancona was given information about Chronic Care Management services today including:  CCM service includes personalized support from designated clinical staff supervised by her physician, including individualized plan of care and coordination with other care providers 24/7 contact phone numbers for assistance for urgent and routine care needs. Standard insurance, coinsurance, copays and deductibles apply for chronic care management only during months in which we provide at least 20 minutes of these services. Most insurances cover these services at 100%, however patients may be responsible for any copay, coinsurance and/or deductible if applicable. This service may help you avoid the need for more expensive face-to-face services. Only one practitioner may furnish and bill the service in a calendar month. The patient may stop CCM services at any time (effective at the end of the month) by phone call to the office staff.  Patient agreed to services and verbal consent obtained.   Patient verbalizes understanding of instructions and care plan provided today and agrees to view in [place]. Active MyChart status confirmed with patient.   The pharmacy team will reach out to the patient again over the next 30 days.   Viona Gilmore, [facility]

## 2021-05-05 ENCOUNTER — Other Ambulatory Visit: Payer: Self-pay | Admitting: Medical

## 2021-05-05 ENCOUNTER — Other Ambulatory Visit: Payer: Self-pay | Admitting: Adult Health

## 2021-05-05 MED ORDER — BACLOFEN 10 MG PO TABS: 10.0000 mg | ORAL_TABLET | Freq: Two times a day (BID) | ORAL | 2 refills | Status: DC

## 2021-05-05 NOTE — Telephone Encounter (Signed)
Pt left VM that her prescriptions need to be for 90 day supplies not 30 ( Toprol and Baclofen ) please advise if we can send these in for 90 days

## 2021-05-05 NOTE — Telephone Encounter (Signed)
Spoke to patient to notify and she asks for:  Handicap parking form ( filling out will bring to you to sign and Bob/ husband will pick today or tomorrow)  Requests colcrys PRN for gout flare up she has had flare up in bilateral great toes for almost 2 days now. Confirmed she is taking allopurinol 300 daily but states colcrys will take care of flare up  Uses [facility] on [address]

## 2021-05-06 ENCOUNTER — Other Ambulatory Visit: Payer: Self-pay | Admitting: Medical

## 2021-05-06 MED ORDER — COLCHICINE 0.6 MG PO TABS: ORAL_TABLET | ORAL | 0 refills | Status: DC

## 2021-05-07 DIAGNOSIS — J449 Chronic obstructive pulmonary disease, unspecified: Secondary | ICD-10-CM | POA: Diagnosis not present

## 2021-05-07 NOTE — Telephone Encounter (Signed)
Handicap placard already completed Wednesday

## 2021-05-12 ENCOUNTER — Telehealth: Payer: Self-pay

## 2021-05-12 DIAGNOSIS — R000 Tachycardia, unspecified: Secondary | ICD-10-CM

## 2021-05-12 DIAGNOSIS — I5032 Chronic diastolic (congestive) heart failure: Secondary | ICD-10-CM

## 2021-05-12 NOTE — Telephone Encounter (Signed)
I have put in another referral. Pt notified

## 2021-05-12 NOTE — Telephone Encounter (Signed)
Pt. Called stating that she wanted to know if you could put in a referral to a different Cardiologists for her. She is not happy with Dr. Debara Pickett. She wanted a referral to see Dr. Saralyn Pilar so she could get a different opinion about what's going on with her heart.

## 2021-05-18 DIAGNOSIS — J449 Chronic obstructive pulmonary disease, unspecified: Secondary | ICD-10-CM | POA: Diagnosis not present

## 2021-05-18 DIAGNOSIS — E1169 Type 2 diabetes mellitus with other specified complication: Secondary | ICD-10-CM | POA: Diagnosis not present

## 2021-05-18 DIAGNOSIS — E039 Hypothyroidism, unspecified: Secondary | ICD-10-CM | POA: Diagnosis not present

## 2021-05-18 DIAGNOSIS — F320 Major depressive disorder, single episode, mild: Secondary | ICD-10-CM

## 2021-05-18 DIAGNOSIS — I509 Heart failure, unspecified: Secondary | ICD-10-CM

## 2021-05-18 DIAGNOSIS — I10 Essential (primary) hypertension: Secondary | ICD-10-CM | POA: Diagnosis not present

## 2021-05-19 DIAGNOSIS — K5909 Other constipation: Secondary | ICD-10-CM | POA: Diagnosis not present

## 2021-05-19 DIAGNOSIS — G2581 Restless legs syndrome: Secondary | ICD-10-CM | POA: Diagnosis not present

## 2021-05-19 DIAGNOSIS — F418 Other specified anxiety disorders: Secondary | ICD-10-CM | POA: Diagnosis not present

## 2021-05-19 DIAGNOSIS — Z9981 Dependence on supplemental oxygen: Secondary | ICD-10-CM | POA: Diagnosis not present

## 2021-05-19 DIAGNOSIS — I872 Venous insufficiency (chronic) (peripheral): Secondary | ICD-10-CM | POA: Diagnosis not present

## 2021-05-19 DIAGNOSIS — I130 Hypertensive heart and chronic kidney disease with heart failure and stage 1 through stage 4 chronic kidney disease, or unspecified chronic kidney disease: Secondary | ICD-10-CM | POA: Diagnosis not present

## 2021-05-19 DIAGNOSIS — M109 Gout, unspecified: Secondary | ICD-10-CM | POA: Diagnosis not present

## 2021-05-19 DIAGNOSIS — E1142 Type 2 diabetes mellitus with diabetic polyneuropathy: Secondary | ICD-10-CM | POA: Diagnosis not present

## 2021-05-19 DIAGNOSIS — J9601 Acute respiratory failure with hypoxia: Secondary | ICD-10-CM | POA: Diagnosis not present

## 2021-05-19 DIAGNOSIS — E1151 Type 2 diabetes mellitus with diabetic peripheral angiopathy without gangrene: Secondary | ICD-10-CM | POA: Diagnosis not present

## 2021-05-19 DIAGNOSIS — K219 Gastro-esophageal reflux disease without esophagitis: Secondary | ICD-10-CM | POA: Diagnosis not present

## 2021-05-19 DIAGNOSIS — K6282 Dysplasia of anus: Secondary | ICD-10-CM | POA: Diagnosis not present

## 2021-05-19 DIAGNOSIS — N1831 Chronic kidney disease, stage 3a: Secondary | ICD-10-CM | POA: Diagnosis not present

## 2021-05-19 DIAGNOSIS — J439 Emphysema, unspecified: Secondary | ICD-10-CM | POA: Diagnosis not present

## 2021-05-19 DIAGNOSIS — M170 Bilateral primary osteoarthritis of knee: Secondary | ICD-10-CM | POA: Diagnosis not present

## 2021-05-19 DIAGNOSIS — E785 Hyperlipidemia, unspecified: Secondary | ICD-10-CM | POA: Diagnosis not present

## 2021-05-19 DIAGNOSIS — G250 Essential tremor: Secondary | ICD-10-CM | POA: Diagnosis not present

## 2021-05-19 DIAGNOSIS — E1122 Type 2 diabetes mellitus with diabetic chronic kidney disease: Secondary | ICD-10-CM | POA: Diagnosis not present

## 2021-05-19 DIAGNOSIS — E039 Hypothyroidism, unspecified: Secondary | ICD-10-CM | POA: Diagnosis not present

## 2021-05-19 DIAGNOSIS — M47816 Spondylosis without myelopathy or radiculopathy, lumbar region: Secondary | ICD-10-CM | POA: Diagnosis not present

## 2021-05-19 DIAGNOSIS — M48061 Spinal stenosis, lumbar region without neurogenic claudication: Secondary | ICD-10-CM | POA: Diagnosis not present

## 2021-05-19 DIAGNOSIS — I5032 Chronic diastolic (congestive) heart failure: Secondary | ICD-10-CM | POA: Diagnosis not present

## 2021-05-19 DIAGNOSIS — G8929 Other chronic pain: Secondary | ICD-10-CM | POA: Diagnosis not present

## 2021-05-20 ENCOUNTER — Telehealth: Payer: Self-pay | Admitting: Pharmacist

## 2021-05-20 NOTE — Telephone Encounter (Signed)
Called patient after discussion with PCP from CCM visit. PCP is ok with patient trying to increase ropinirole to 2 tablets at bedtime to see if this helps with her symptoms. Will follow up in 2-3 weeks to see if patient's symptoms have improved.  Will re-send PAP for Trulicity as patient has not received yet.

## 2021-05-25 DIAGNOSIS — E785 Hyperlipidemia, unspecified: Secondary | ICD-10-CM | POA: Diagnosis not present

## 2021-05-25 DIAGNOSIS — I5032 Chronic diastolic (congestive) heart failure: Secondary | ICD-10-CM | POA: Diagnosis not present

## 2021-05-25 DIAGNOSIS — J9601 Acute respiratory failure with hypoxia: Secondary | ICD-10-CM | POA: Diagnosis not present

## 2021-05-25 DIAGNOSIS — N1831 Chronic kidney disease, stage 3a: Secondary | ICD-10-CM | POA: Diagnosis not present

## 2021-05-25 DIAGNOSIS — G250 Essential tremor: Secondary | ICD-10-CM | POA: Diagnosis not present

## 2021-05-25 DIAGNOSIS — E039 Hypothyroidism, unspecified: Secondary | ICD-10-CM | POA: Diagnosis not present

## 2021-05-25 DIAGNOSIS — G2581 Restless legs syndrome: Secondary | ICD-10-CM | POA: Diagnosis not present

## 2021-05-25 DIAGNOSIS — J439 Emphysema, unspecified: Secondary | ICD-10-CM | POA: Diagnosis not present

## 2021-05-25 DIAGNOSIS — E1122 Type 2 diabetes mellitus with diabetic chronic kidney disease: Secondary | ICD-10-CM | POA: Diagnosis not present

## 2021-05-25 DIAGNOSIS — M109 Gout, unspecified: Secondary | ICD-10-CM | POA: Diagnosis not present

## 2021-05-25 DIAGNOSIS — E1151 Type 2 diabetes mellitus with diabetic peripheral angiopathy without gangrene: Secondary | ICD-10-CM | POA: Diagnosis not present

## 2021-05-25 DIAGNOSIS — K5909 Other constipation: Secondary | ICD-10-CM | POA: Diagnosis not present

## 2021-05-25 DIAGNOSIS — M48061 Spinal stenosis, lumbar region without neurogenic claudication: Secondary | ICD-10-CM | POA: Diagnosis not present

## 2021-05-25 DIAGNOSIS — I872 Venous insufficiency (chronic) (peripheral): Secondary | ICD-10-CM | POA: Diagnosis not present

## 2021-05-25 DIAGNOSIS — M47816 Spondylosis without myelopathy or radiculopathy, lumbar region: Secondary | ICD-10-CM | POA: Diagnosis not present

## 2021-05-25 DIAGNOSIS — F418 Other specified anxiety disorders: Secondary | ICD-10-CM | POA: Diagnosis not present

## 2021-05-25 DIAGNOSIS — K219 Gastro-esophageal reflux disease without esophagitis: Secondary | ICD-10-CM | POA: Diagnosis not present

## 2021-05-25 DIAGNOSIS — Z9981 Dependence on supplemental oxygen: Secondary | ICD-10-CM | POA: Diagnosis not present

## 2021-05-25 DIAGNOSIS — M170 Bilateral primary osteoarthritis of knee: Secondary | ICD-10-CM | POA: Diagnosis not present

## 2021-05-25 DIAGNOSIS — I130 Hypertensive heart and chronic kidney disease with heart failure and stage 1 through stage 4 chronic kidney disease, or unspecified chronic kidney disease: Secondary | ICD-10-CM | POA: Diagnosis not present

## 2021-05-25 DIAGNOSIS — E1142 Type 2 diabetes mellitus with diabetic polyneuropathy: Secondary | ICD-10-CM | POA: Diagnosis not present

## 2021-05-25 DIAGNOSIS — G8929 Other chronic pain: Secondary | ICD-10-CM | POA: Diagnosis not present

## 2021-05-25 DIAGNOSIS — K6282 Dysplasia of anus: Secondary | ICD-10-CM | POA: Diagnosis not present

## 2021-05-26 DIAGNOSIS — I5032 Chronic diastolic (congestive) heart failure: Secondary | ICD-10-CM | POA: Diagnosis not present

## 2021-05-26 DIAGNOSIS — M170 Bilateral primary osteoarthritis of knee: Secondary | ICD-10-CM | POA: Diagnosis not present

## 2021-05-26 DIAGNOSIS — I130 Hypertensive heart and chronic kidney disease with heart failure and stage 1 through stage 4 chronic kidney disease, or unspecified chronic kidney disease: Secondary | ICD-10-CM | POA: Diagnosis not present

## 2021-05-26 DIAGNOSIS — J439 Emphysema, unspecified: Secondary | ICD-10-CM | POA: Diagnosis not present

## 2021-05-26 DIAGNOSIS — E1142 Type 2 diabetes mellitus with diabetic polyneuropathy: Secondary | ICD-10-CM | POA: Diagnosis not present

## 2021-05-26 DIAGNOSIS — I872 Venous insufficiency (chronic) (peripheral): Secondary | ICD-10-CM | POA: Diagnosis not present

## 2021-05-26 DIAGNOSIS — E785 Hyperlipidemia, unspecified: Secondary | ICD-10-CM | POA: Diagnosis not present

## 2021-05-26 DIAGNOSIS — J9601 Acute respiratory failure with hypoxia: Secondary | ICD-10-CM | POA: Diagnosis not present

## 2021-05-26 DIAGNOSIS — K5909 Other constipation: Secondary | ICD-10-CM | POA: Diagnosis not present

## 2021-05-26 DIAGNOSIS — K6282 Dysplasia of anus: Secondary | ICD-10-CM | POA: Diagnosis not present

## 2021-05-26 DIAGNOSIS — M109 Gout, unspecified: Secondary | ICD-10-CM | POA: Diagnosis not present

## 2021-05-26 DIAGNOSIS — E1122 Type 2 diabetes mellitus with diabetic chronic kidney disease: Secondary | ICD-10-CM | POA: Diagnosis not present

## 2021-05-26 DIAGNOSIS — G250 Essential tremor: Secondary | ICD-10-CM | POA: Diagnosis not present

## 2021-05-26 DIAGNOSIS — Z9981 Dependence on supplemental oxygen: Secondary | ICD-10-CM | POA: Diagnosis not present

## 2021-05-26 DIAGNOSIS — M47816 Spondylosis without myelopathy or radiculopathy, lumbar region: Secondary | ICD-10-CM | POA: Diagnosis not present

## 2021-05-26 DIAGNOSIS — E039 Hypothyroidism, unspecified: Secondary | ICD-10-CM | POA: Diagnosis not present

## 2021-05-26 DIAGNOSIS — K219 Gastro-esophageal reflux disease without esophagitis: Secondary | ICD-10-CM | POA: Diagnosis not present

## 2021-05-26 DIAGNOSIS — G2581 Restless legs syndrome: Secondary | ICD-10-CM | POA: Diagnosis not present

## 2021-05-26 DIAGNOSIS — G8929 Other chronic pain: Secondary | ICD-10-CM | POA: Diagnosis not present

## 2021-05-26 DIAGNOSIS — F418 Other specified anxiety disorders: Secondary | ICD-10-CM | POA: Diagnosis not present

## 2021-05-26 DIAGNOSIS — M48061 Spinal stenosis, lumbar region without neurogenic claudication: Secondary | ICD-10-CM | POA: Diagnosis not present

## 2021-05-26 DIAGNOSIS — E1151 Type 2 diabetes mellitus with diabetic peripheral angiopathy without gangrene: Secondary | ICD-10-CM | POA: Diagnosis not present

## 2021-05-26 DIAGNOSIS — N1831 Chronic kidney disease, stage 3a: Secondary | ICD-10-CM | POA: Diagnosis not present

## 2021-05-28 ENCOUNTER — Telehealth: Payer: Self-pay

## 2021-05-28 NOTE — Telephone Encounter (Signed)
° °  Pre-operative Risk Assessment    Patient Name: Brandi Stephens  DOB: January 07, 1946 MRN: [id number]     Request for Surgical Clearance    Procedure:  Dental Extraction - Amount of Teeth to be Pulled:  10 teeth in total, all her upper remaining teeth.  Date of Surgery:  Clearance TBD                                 Surgeon:  Ian Malkin Surgeon's Group or Practice Name:   [facility] Phone number:  [phone] Fax number:  [phone]   Type of Clearance Requested:   Medical   Type of Anesthesia:   A light IV sedation, using medications including; Versed, Atropine, Fentanyl, Decadron and Sodium Brevital    Additional requests/questions:  Does this patient need antibiotics? Please fax a copy of Clearance to the surgeon's office.  Signed, Maple Mirza   05/28/2021, 1:54 PM

## 2021-05-28 NOTE — Telephone Encounter (Signed)
Left a message for the patient to call back and speak to the on-call preop APP of the day 

## 2021-05-31 NOTE — Telephone Encounter (Signed)
Pt returning phone call... please advise  

## 2021-05-31 NOTE — Telephone Encounter (Addendum)
° °  Patient Name: Brandi Stephens  DOB: 24-Jun-1945 MRN: [id number]  Primary Cardiologist: [name], MD  Chart reviewed as part of pre-operative protocol coverage. Patient was last seen by Dr. Debara Pickett 04/22/21 with issues as outlined including worsening LE edema, shortness of breath, O2 requirement, low BP and tachycardia. At that visit he felt she was overdiuresed and her Bumex was scaled back. 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 She had a nuclear stress test in 12/2020 which was low risk.   I reached out to patient for update on how she is doing. The patient affirms she has been doing well without any new cardiac symptoms - prior symptoms are stable/improved. Still gets SOB with exertion but no progression recently. No syncope or chest pain. Reports stable BP. She has weaned down to 2L O2. Therefore, based on ACC/AHA guidelines, the patient would be at acceptable risk for the planned procedure without further cardiovascular testing. The patient was advised that if she develops new symptoms prior to surgery to contact our office to arrange for a follow-up visit, and she verbalized understanding. Per chart review, she does not require pre-dental antibiotics (SBE prophylaxis) from a cardiac standpoint.  The biggest limitation for this patient appears to be her pulmonary status and chronic hypoxia requiring oxygen supplementation so would recommend that if dental team needing full medical clearance for procedure, they reach out to patient's pulmonologist for clearance. From cardiac standpoint, there are no specific contraindications at this time.  Will route this bundled recommendation to requesting provider via Epic fax function. Please call with questions.   Charlie Pitter, PA-C 05/31/2021, 1:55 PM

## 2021-05-31 NOTE — Telephone Encounter (Signed)
Pt returning pre op call   Best number for pt [phone]

## 2021-06-02 ENCOUNTER — Telehealth: Payer: Self-pay | Admitting: Medical

## 2021-06-02 DIAGNOSIS — M170 Bilateral primary osteoarthritis of knee: Secondary | ICD-10-CM | POA: Diagnosis not present

## 2021-06-02 DIAGNOSIS — E1122 Type 2 diabetes mellitus with diabetic chronic kidney disease: Secondary | ICD-10-CM | POA: Diagnosis not present

## 2021-06-02 DIAGNOSIS — E1142 Type 2 diabetes mellitus with diabetic polyneuropathy: Secondary | ICD-10-CM | POA: Diagnosis not present

## 2021-06-02 DIAGNOSIS — E785 Hyperlipidemia, unspecified: Secondary | ICD-10-CM | POA: Diagnosis not present

## 2021-06-02 DIAGNOSIS — K219 Gastro-esophageal reflux disease without esophagitis: Secondary | ICD-10-CM | POA: Diagnosis not present

## 2021-06-02 DIAGNOSIS — E1151 Type 2 diabetes mellitus with diabetic peripheral angiopathy without gangrene: Secondary | ICD-10-CM | POA: Diagnosis not present

## 2021-06-02 DIAGNOSIS — I872 Venous insufficiency (chronic) (peripheral): Secondary | ICD-10-CM | POA: Diagnosis not present

## 2021-06-02 DIAGNOSIS — I130 Hypertensive heart and chronic kidney disease with heart failure and stage 1 through stage 4 chronic kidney disease, or unspecified chronic kidney disease: Secondary | ICD-10-CM | POA: Diagnosis not present

## 2021-06-02 DIAGNOSIS — N1831 Chronic kidney disease, stage 3a: Secondary | ICD-10-CM | POA: Diagnosis not present

## 2021-06-02 DIAGNOSIS — K6282 Dysplasia of anus: Secondary | ICD-10-CM | POA: Diagnosis not present

## 2021-06-02 DIAGNOSIS — Z87891 Personal history of nicotine dependence: Secondary | ICD-10-CM | POA: Diagnosis not present

## 2021-06-02 DIAGNOSIS — I5032 Chronic diastolic (congestive) heart failure: Secondary | ICD-10-CM | POA: Diagnosis not present

## 2021-06-02 DIAGNOSIS — J439 Emphysema, unspecified: Secondary | ICD-10-CM | POA: Diagnosis not present

## 2021-06-02 DIAGNOSIS — G2581 Restless legs syndrome: Secondary | ICD-10-CM | POA: Diagnosis not present

## 2021-06-02 DIAGNOSIS — M47816 Spondylosis without myelopathy or radiculopathy, lumbar region: Secondary | ICD-10-CM | POA: Diagnosis not present

## 2021-06-02 DIAGNOSIS — M109 Gout, unspecified: Secondary | ICD-10-CM | POA: Diagnosis not present

## 2021-06-02 DIAGNOSIS — G8929 Other chronic pain: Secondary | ICD-10-CM | POA: Diagnosis not present

## 2021-06-02 DIAGNOSIS — Z9981 Dependence on supplemental oxygen: Secondary | ICD-10-CM | POA: Diagnosis not present

## 2021-06-02 DIAGNOSIS — K5909 Other constipation: Secondary | ICD-10-CM | POA: Diagnosis not present

## 2021-06-02 DIAGNOSIS — G250 Essential tremor: Secondary | ICD-10-CM | POA: Diagnosis not present

## 2021-06-02 DIAGNOSIS — M48061 Spinal stenosis, lumbar region without neurogenic claudication: Secondary | ICD-10-CM | POA: Diagnosis not present

## 2021-06-02 DIAGNOSIS — E039 Hypothyroidism, unspecified: Secondary | ICD-10-CM | POA: Diagnosis not present

## 2021-06-02 DIAGNOSIS — F418 Other specified anxiety disorders: Secondary | ICD-10-CM | POA: Diagnosis not present

## 2021-06-02 NOTE — Telephone Encounter (Signed)
You agreed to take patient on. As it was documented that I asked you and pcp was switched. She said her foot is doing fine and no issues at this point. She is having 10 teeth pulled tomorrow and getting denture on top tomorrow. If her symptoms get worse or anything she will call to schedule an appt

## 2021-06-02 NOTE — Telephone Encounter (Signed)
Pt called and states that her big toenail on her left foot has come of, it come off on Friday night, states it had fungus under it, and she has been soaking in epson salt, but since she was a diabetic she was concerned. she can not come in the office as she can not drive she said, so she wanted to know what you wanted her to do, go to the foot dr or do a virtual, if you wanted her to go to the foot dr she needed a referral

## 2021-06-03 NOTE — Telephone Encounter (Signed)
Called patient to let her know we are unable to apply for Trulicity PAP at this time due to cost. Plan to apply for Ozempic patient assistance. Will mail a PAP application.

## 2021-06-04 NOTE — Progress Notes (Signed)
Chronic Care Management Pharmacy Assistant   Name: Brandi Stephens  MRN: [id number] DOB: 1946/02/10  Reason for Encounter: Ozempic PAP  Medications: Outpatient Encounter Medications as of 05/20/2021  Medication Sig   albuterol (VENTOLIN HFA) 108 (90 Base) MCG/ACT inhaler INHALE 2 PUFFS BY MOUTH EVERY 4 TO 6 HOURS AS NEEDED (Patient taking differently: Inhale 2 puffs into the lungs every 4 (four) hours as needed for wheezing.)   allopurinol (ZYLOPRIM) 300 MG tablet 1 daily (Patient taking differently: Take 300 mg by mouth daily. 1 daily)   amitriptyline (ELAVIL) 75 MG tablet TAKE 1 TABLET(75 MG) BY MOUTH AT BEDTIME (Patient taking differently: Take 75 mg by mouth at bedtime.)   atorvastatin (LIPITOR) 80 MG tablet Take 1 tablet (80 mg total) by mouth daily.   baclofen (LIORESAL) 10 MG tablet Take 1 tablet (10 mg total) by mouth 2 (two) times daily.   betamethasone dipropionate 0.05 % cream Apply 1 application topically daily as needed (irritated skin).   Biotin 3 MG TABS Take 3 mg by mouth daily.   blood glucose meter kit and supplies KIT Dispense based on patient and insurance preference. Use up to four times daily as directed.   bumetanide (BUMEX) 1 MG tablet Take 1 mg by mouth daily.   cephALEXin (KEFLEX) 500 MG capsule TAKE 1 CAPSULE(500 MG) BY MOUTH THREE TIMES DAILY   Cholecalciferol (D3 ADULT PO) Take 2,000 Units by mouth daily.   colchicine 0.6 MG tablet Take 2 tablets at once then take 1 tablet an hour later then then 1 tablet twice daily x 3 days.   diazepam (VALIUM) 5 MG tablet Take 1 tablet (5 mg total) by mouth daily as needed for anxiety.   diclofenac Sodium (VOLTAREN) 1 % GEL Apply 4 g topically 4 (four) times daily.   docusate sodium (COLACE) 100 MG capsule Take 1 capsule (100 mg total) by mouth 3 (three) times daily. (Patient taking differently: Take 100 mg by mouth 2 (two) times daily.)   Dulaglutide (TRULICITY) 5.32 DJ/2.4QA SOPN Inject 0.75 mg into the skin once a week.    DULoxetine (CYMBALTA) 60 MG capsule TAKE 1 CAPSULE(60 MG) BY MOUTH DAILY (Patient taking differently: Take 60 mg by mouth daily.)   Fluticasone-Umeclidin-Vilant ([name] ELLIPTA) 100-62.5-25 MCG/ACT AEPB Inhale 1 puff into the lungs daily.   Fluticasone-Umeclidin-Vilant ([name] ELLIPTA) 100-62.5-25 MCG/ACT AEPB Inhale 1 puff into the lungs daily.   HYDROcodone-acetaminophen (NORCO) 5-325 MG tablet Take 1 tablet by mouth 2 (two) times daily as needed.   levothyroxine (SYNTHROID) 75 MCG tablet TAKE 1 TABLET(75 MCG) BY MOUTH DAILY BEFORE BREAKFAST (Patient taking differently: Take 75 mcg by mouth daily before breakfast.)   Magnesium Gluconate 250 MG TABS Take 1 tablet by mouth daily.   metoprolol succinate (TOPROL-XL) 50 MG 24 hr tablet Take 1 tablet (50 mg total) by mouth daily. Take with or immediately following a meal.   polyethylene glycol powder (GLYCOLAX/MIRALAX) 17 GM/SCOOP powder Take 17 g by mouth daily as needed for moderate constipation.   primidone (MYSOLINE) 50 MG tablet TAKE 1 TABLET BY MOUTH TWICE DAILY (Patient taking differently: Take 50 mg by mouth 2 (two) times daily.)   Probiotic Product (PROBIOTIC DAILY PO) Take 1 tablet by mouth daily.   Propylene Glycol (SYSTANE BALANCE OP) Place 1 drop into both eyes 4 (four) times daily as needed (dry eyes).   rOPINIRole (REQUIP) 0.5 MG tablet Take 1.5 tablets (0.75 mg total) by mouth at bedtime.   topiramate (TOPAMAX) 25 MG tablet  Take 1 tablet (25 mg total) by mouth at bedtime.   vitamin B-12 (CYANOCOBALAMIN) 1000 MCG tablet Take 1 tablet (1,000 mcg total) by mouth daily.   No facility-administered encounter medications on file as of 05/20/2021.   PAP Prefilled out for Ozempic 0.5 mg once a week per MP, call to patient to be made aware it will be coming to her via mail with instructions for completion she was in agreement.  Care Gaps: Urine Micro - Overdue COVID Booster - Overdue Colonoscopy - Overdue Bp- 126/70 (04/28/21) AWV-  11/22 CCM - 5/23  Star Rating Drugs: Atorvastatin 20 mg - Last filled 04/30/21 90 DS at [facility] Dulaglutide (Trulicity) 1.00 mg - Last filled 05/20/21 28 DS at [facility] Pharmacist Assistant [phone]

## 2021-06-07 DIAGNOSIS — J449 Chronic obstructive pulmonary disease, unspecified: Secondary | ICD-10-CM | POA: Diagnosis not present

## 2021-06-10 ENCOUNTER — Other Ambulatory Visit: Payer: Self-pay | Admitting: Medical

## 2021-06-10 MED ORDER — ROPINIROLE HCL 1 MG PO TABS: 1.0000 mg | ORAL_TABLET | Freq: Every day | ORAL | 0 refills | Status: DC

## 2021-06-10 NOTE — Progress Notes (Signed)
Per MP Call to patient to see how she is tolerating the increase of Roprinirole for her Restless legs. Patient reports she has been doing one at 6 pm and another at bedtime and it is helping her tremendously. She reports with the increase she will soon be out of medication and need a new prescription. Advised her I would let MP know so that a new prescription reflective of the dose change would be submitted to her pharmacy. She was in agreement.  Patient Assistance: [facility] - mailed to pt  [place] Clinical Pharmacist Assistant [phone]

## 2021-06-12 ENCOUNTER — Other Ambulatory Visit: Payer: Self-pay | Admitting: Medical

## 2021-06-14 DIAGNOSIS — H04123 Dry eye syndrome of bilateral lacrimal glands: Secondary | ICD-10-CM | POA: Diagnosis not present

## 2021-06-14 DIAGNOSIS — H35373 Puckering of macula, bilateral: Secondary | ICD-10-CM | POA: Diagnosis not present

## 2021-06-16 DIAGNOSIS — E1122 Type 2 diabetes mellitus with diabetic chronic kidney disease: Secondary | ICD-10-CM | POA: Diagnosis not present

## 2021-06-16 DIAGNOSIS — G250 Essential tremor: Secondary | ICD-10-CM | POA: Diagnosis not present

## 2021-06-16 DIAGNOSIS — F418 Other specified anxiety disorders: Secondary | ICD-10-CM | POA: Diagnosis not present

## 2021-06-16 DIAGNOSIS — E1151 Type 2 diabetes mellitus with diabetic peripheral angiopathy without gangrene: Secondary | ICD-10-CM | POA: Diagnosis not present

## 2021-06-16 DIAGNOSIS — E785 Hyperlipidemia, unspecified: Secondary | ICD-10-CM | POA: Diagnosis not present

## 2021-06-16 DIAGNOSIS — I5032 Chronic diastolic (congestive) heart failure: Secondary | ICD-10-CM | POA: Diagnosis not present

## 2021-06-16 DIAGNOSIS — I130 Hypertensive heart and chronic kidney disease with heart failure and stage 1 through stage 4 chronic kidney disease, or unspecified chronic kidney disease: Secondary | ICD-10-CM | POA: Diagnosis not present

## 2021-06-16 DIAGNOSIS — E1142 Type 2 diabetes mellitus with diabetic polyneuropathy: Secondary | ICD-10-CM | POA: Diagnosis not present

## 2021-06-16 DIAGNOSIS — Z9981 Dependence on supplemental oxygen: Secondary | ICD-10-CM | POA: Diagnosis not present

## 2021-06-16 DIAGNOSIS — M47816 Spondylosis without myelopathy or radiculopathy, lumbar region: Secondary | ICD-10-CM | POA: Diagnosis not present

## 2021-06-16 DIAGNOSIS — M48061 Spinal stenosis, lumbar region without neurogenic claudication: Secondary | ICD-10-CM | POA: Diagnosis not present

## 2021-06-16 DIAGNOSIS — M109 Gout, unspecified: Secondary | ICD-10-CM | POA: Diagnosis not present

## 2021-06-16 DIAGNOSIS — G8929 Other chronic pain: Secondary | ICD-10-CM | POA: Diagnosis not present

## 2021-06-16 DIAGNOSIS — K6282 Dysplasia of anus: Secondary | ICD-10-CM | POA: Diagnosis not present

## 2021-06-16 DIAGNOSIS — G2581 Restless legs syndrome: Secondary | ICD-10-CM | POA: Diagnosis not present

## 2021-06-16 DIAGNOSIS — Z87891 Personal history of nicotine dependence: Secondary | ICD-10-CM | POA: Diagnosis not present

## 2021-06-16 DIAGNOSIS — K5909 Other constipation: Secondary | ICD-10-CM | POA: Diagnosis not present

## 2021-06-16 DIAGNOSIS — I872 Venous insufficiency (chronic) (peripheral): Secondary | ICD-10-CM | POA: Diagnosis not present

## 2021-06-16 DIAGNOSIS — N1831 Chronic kidney disease, stage 3a: Secondary | ICD-10-CM | POA: Diagnosis not present

## 2021-06-16 DIAGNOSIS — K219 Gastro-esophageal reflux disease without esophagitis: Secondary | ICD-10-CM | POA: Diagnosis not present

## 2021-06-16 DIAGNOSIS — J439 Emphysema, unspecified: Secondary | ICD-10-CM | POA: Diagnosis not present

## 2021-06-16 DIAGNOSIS — E039 Hypothyroidism, unspecified: Secondary | ICD-10-CM | POA: Diagnosis not present

## 2021-06-16 DIAGNOSIS — M170 Bilateral primary osteoarthritis of knee: Secondary | ICD-10-CM | POA: Diagnosis not present

## 2021-06-30 ENCOUNTER — Telehealth: Payer: Self-pay

## 2021-06-30 DIAGNOSIS — Z87891 Personal history of nicotine dependence: Secondary | ICD-10-CM | POA: Diagnosis not present

## 2021-06-30 DIAGNOSIS — M48061 Spinal stenosis, lumbar region without neurogenic claudication: Secondary | ICD-10-CM | POA: Diagnosis not present

## 2021-06-30 DIAGNOSIS — F418 Other specified anxiety disorders: Secondary | ICD-10-CM | POA: Diagnosis not present

## 2021-06-30 DIAGNOSIS — K219 Gastro-esophageal reflux disease without esophagitis: Secondary | ICD-10-CM | POA: Diagnosis not present

## 2021-06-30 DIAGNOSIS — I130 Hypertensive heart and chronic kidney disease with heart failure and stage 1 through stage 4 chronic kidney disease, or unspecified chronic kidney disease: Secondary | ICD-10-CM | POA: Diagnosis not present

## 2021-06-30 DIAGNOSIS — E1122 Type 2 diabetes mellitus with diabetic chronic kidney disease: Secondary | ICD-10-CM | POA: Diagnosis not present

## 2021-06-30 DIAGNOSIS — G250 Essential tremor: Secondary | ICD-10-CM | POA: Diagnosis not present

## 2021-06-30 DIAGNOSIS — M109 Gout, unspecified: Secondary | ICD-10-CM | POA: Diagnosis not present

## 2021-06-30 DIAGNOSIS — I872 Venous insufficiency (chronic) (peripheral): Secondary | ICD-10-CM | POA: Diagnosis not present

## 2021-06-30 DIAGNOSIS — M170 Bilateral primary osteoarthritis of knee: Secondary | ICD-10-CM | POA: Diagnosis not present

## 2021-06-30 DIAGNOSIS — G2581 Restless legs syndrome: Secondary | ICD-10-CM | POA: Diagnosis not present

## 2021-06-30 DIAGNOSIS — Z9981 Dependence on supplemental oxygen: Secondary | ICD-10-CM | POA: Diagnosis not present

## 2021-06-30 DIAGNOSIS — K5909 Other constipation: Secondary | ICD-10-CM | POA: Diagnosis not present

## 2021-06-30 DIAGNOSIS — N1831 Chronic kidney disease, stage 3a: Secondary | ICD-10-CM | POA: Diagnosis not present

## 2021-06-30 DIAGNOSIS — M47816 Spondylosis without myelopathy or radiculopathy, lumbar region: Secondary | ICD-10-CM | POA: Diagnosis not present

## 2021-06-30 DIAGNOSIS — E039 Hypothyroidism, unspecified: Secondary | ICD-10-CM | POA: Diagnosis not present

## 2021-06-30 DIAGNOSIS — J439 Emphysema, unspecified: Secondary | ICD-10-CM | POA: Diagnosis not present

## 2021-06-30 DIAGNOSIS — K6282 Dysplasia of anus: Secondary | ICD-10-CM | POA: Diagnosis not present

## 2021-06-30 DIAGNOSIS — I5032 Chronic diastolic (congestive) heart failure: Secondary | ICD-10-CM | POA: Diagnosis not present

## 2021-06-30 DIAGNOSIS — E785 Hyperlipidemia, unspecified: Secondary | ICD-10-CM | POA: Diagnosis not present

## 2021-06-30 DIAGNOSIS — E1142 Type 2 diabetes mellitus with diabetic polyneuropathy: Secondary | ICD-10-CM | POA: Diagnosis not present

## 2021-06-30 DIAGNOSIS — E1151 Type 2 diabetes mellitus with diabetic peripheral angiopathy without gangrene: Secondary | ICD-10-CM | POA: Diagnosis not present

## 2021-06-30 DIAGNOSIS — G8929 Other chronic pain: Secondary | ICD-10-CM | POA: Diagnosis not present

## 2021-06-30 NOTE — Telephone Encounter (Signed)
Called LM with verbal orders.  ?

## 2021-06-30 NOTE — Telephone Encounter (Signed)
Ms. Ruthann Cancer form [facility] called for a verbal order for PT once a week for 5 weeks. She can be reached at [phone]. ?

## 2021-06-30 NOTE — Telephone Encounter (Signed)
Called pt. LM to call back to schedule med check. Did you want me to give a verbal order for home health or did you want to wait until she came in for the med check apt. Once scheduled.  ?

## 2021-07-02 ENCOUNTER — Encounter: Payer: Self-pay | Admitting: Medical

## 2021-07-02 ENCOUNTER — Telehealth (INDEPENDENT_AMBULATORY_CARE_PROVIDER_SITE_OTHER): Payer: PPO | Admitting: Medical

## 2021-07-02 VITALS — BP 118/70 | Wt 263.0 lb

## 2021-07-02 DIAGNOSIS — R0902 Hypoxemia: Secondary | ICD-10-CM | POA: Diagnosis not present

## 2021-07-02 DIAGNOSIS — I5032 Chronic diastolic (congestive) heart failure: Secondary | ICD-10-CM | POA: Diagnosis not present

## 2021-07-02 DIAGNOSIS — R058 Other specified cough: Secondary | ICD-10-CM | POA: Diagnosis not present

## 2021-07-02 DIAGNOSIS — J431 Panlobular emphysema: Secondary | ICD-10-CM

## 2021-07-02 DIAGNOSIS — R051 Acute cough: Secondary | ICD-10-CM | POA: Diagnosis not present

## 2021-07-02 MED ORDER — CLARITHROMYCIN 500 MG PO TABS
500.0000 mg | ORAL_TABLET | Freq: Two times a day (BID) | ORAL | 0 refills | Status: DC
Start: 2021-07-02 — End: 2021-07-27

## 2021-07-02 MED ORDER — BENZONATATE 100 MG PO CAPS: 100.0000 mg | ORAL_CAPSULE | Freq: Two times a day (BID) | ORAL | 0 refills | Status: DC | PRN

## 2021-07-02 MED ORDER — PREDNISONE 20 MG PO TABS: ORAL_TABLET | ORAL | 0 refills | Status: DC

## 2021-07-02 NOTE — Progress Notes (Signed)
?Subjective:  ?  ? Patient ID: Brandi Stephens, female   DOB: 25-Feb-1946, 76 y.o.   MRN: [id number] ? ?This visit type was conducted due to national recommendations for restrictions regarding the COVID-19 Pandemic (e.g. social distancing) in an effort to limit this patient's exposure and mitigate transmission in our community.  Due to their co-morbid illnesses, this patient is at least at moderate risk for complications without adequate follow up.  This format is felt to be most appropriate for this patient at this time.   ? ?Documentation for virtual audio and video telecommunications through [facility] encounter: ? ?The patient was located at home. ?The provider was located in the office. ?The patient did consent to this visit and is aware of possible charges through their insurance for this visit. ? ?The other persons participating in this telemedicine service were none. ?Time spent on call was 20 minutes and in review of previous records 20 minutes total. ? ?This virtual service is not related to other E/M service within previous 7 days. ? ? ?HPI ?Chief Complaint  ?Patient presents with  ? cough  ?  Deep Cough, 8-9 days, started out as allergies. Coughing so much she is throwing up, no fever. Started out as allergies  ? ? ?Patient Care Team: ?Jamilex Bohnsack, Leward Quan as PCP - General (Family Medicine) ?[name], MD as PCP - Cardiology (Cardiology) ?Magrinat, Virgie Dad, MD as Consulting Physician (Oncology) ?Stark Klein, MD as Consulting Physician (General Surgery) ?[name], MD as Consulting Physician (Family Medicine) ?[name], MD as Consulting Physician (Neurosurgery) ?Griselda Miner, MD as Consulting Physician (Dermatology) ?Gardenia Phlegm, NP as Nurse Practitioner (Hematology and Oncology) ?Marchia Bond, MD as Consulting Physician (Orthopedic Surgery) ?Clydell Hakim, MD (Inactive) as Consulting Physician (Anesthesiology) ?Armbruster, Carlota Raspberry, MD as Consulting  Physician (Gastroenterology) ?[name], MD as Consulting Physician (Neurosurgery) ?Marcial Pacas, MD as Consulting Physician (Neurology) ?Viona Gilmore, [facility] as Pharmacist (Pharmacist) ?Dr. Sherrilyn Rist, pulmonlogy ? ?  ?[name] is a 76 yo female with past medical history of diastolic CHF, COPD on O2, CKD-3A, HTN, HLD, hypothyroidism, morbid obesity, breast cancer s/p left breast lumpectomy, radiation and hormonal therapy, anxiety, depression and RLS  ? ?Virtual consult for cough, deep cough.  Started 8-9 days ago with allergy symptoms, but then sore throat, and now feels deep cough.  Has been nauesated and vomited a few times with all the cough.  No fever.  Has some sinus presure.   Gets stopped up in nose 3-4 times per day.  Has some runny nose.  No eye symtpoms.   Has some colored mucous.  No blood in mucous.   No sick contacts.  Hasnt done a covid test.  Using mucinex sinus.  On 2 L/min  of oxygen all the time, on [name] daily.  Not currently using albuterol.  No other aggravating or relieving factors. No other complaint. ? ?Past Medical History:  ?Diagnosis Date  ? AIN (anal intraepithelial neoplasia) anal canal   ? Anemia   ? with pregnancy  ? Anxiety   ? Arthritis   ? knees, lower back, knees  ? Chronic constipation   ? Chronic diastolic CHF (congestive heart failure) ([place]) 07/05/2019  ? CKD (chronic kidney disease)   ? Coarse tremors   ? Essential  ? COPD (chronic obstructive pulmonary disease) ([place])   ? 2017 chest CT, history of  ? Degenerative lumbar spinal stenosis   ? Depression   ? Depression 07/05/2019  ? Drug dependency (  [place])   ? Elevated PTHrP level 05/09/2019  ? ETOH abuse 07/05/2019  ? GERD (gastroesophageal reflux disease)   ? pt denies  ? Gout   ? Grade I diastolic dysfunction 72/53/6644  ? Noted ECHO  ? Hemorrhoids   ? History of adenomatous polyp of colon   ? History of basal cell carcinoma (BCC) excision 2015  ? nose  ? History of sepsis 05/14/2014  ? post lumbar surgery  ?  History of shingles   ? x2  ? Hyperlipidemia   ? Hypertension   ? Hypothyroidism   ? Irregular heart beat   ? history of while undergoing radiation  ? Malignant neoplasm of upper-inner quadrant of left breast in female, estrogen receptor positive [facility]) oncologist-  dr Jana Hakim--  per lov in epic no recurrence  ? dx 07-13-2015  Left breast invasive ductal carcinoma, Stage IA, Grade 1 (TXN0),  09-16-2015  s/p  left breast lumpectomy with sln bx's,  completed radiation 11-18-2015,  started antiestrogen therapy 12-14-2015  ? Neuropathy   ? Left foot and back of left leg  ? Obesity (BMI 30-39.9) 07/05/2019  ? Personal history of radiation therapy   ? completed 11-18-2015  left breast  ? Pneumonia   ? PONV (postoperative nausea and vomiting)   ? Prediabetes   ? diet controlled, no med  ? Restless leg syndrome   ? Seasonal allergies   ? Seizure ([place]) 05/2015  ? due to sepsis, only one time episode  ? Tremor   ? Wears partial dentures   ? lower   ? ?Current Outpatient Medications on File Prior to Visit  ?Medication Sig Dispense Refill  ? albuterol (VENTOLIN HFA) 108 (90 Base) MCG/ACT inhaler INHALE 2 PUFFS BY MOUTH EVERY 4 TO 6 HOURS AS NEEDED (Patient taking differently: Inhale 2 puffs into the lungs every 4 (four) hours as needed for wheezing.) 42.5 g 0  ? allopurinol (ZYLOPRIM) 300 MG tablet 1 daily (Patient taking differently: Take 300 mg by mouth daily. 1 daily) 90 tablet 0  ? amitriptyline (ELAVIL) 75 MG tablet TAKE 1 TABLET(75 MG) BY MOUTH AT BEDTIME (Patient taking differently: Take 75 mg by mouth at bedtime.) 90 tablet 3  ? atorvastatin (LIPITOR) 80 MG tablet Take 1 tablet (80 mg total) by mouth daily. 90 tablet 3  ? baclofen (LIORESAL) 10 MG tablet Take 1 tablet (10 mg total) by mouth 2 (two) times daily. 30 each 2  ? betamethasone dipropionate 0.05 % cream Apply 1 application topically daily as needed (irritated skin).    ? Biotin 3 MG TABS Take 3 mg by mouth daily.    ? blood glucose meter kit and supplies KIT  Dispense based on patient and insurance preference. Use up to four times daily as directed. 1 each 0  ? bumetanide (BUMEX) 1 MG tablet Take 1 mg by mouth daily.    ? cephALEXin (KEFLEX) 500 MG capsule TAKE 1 CAPSULE(500 MG) BY MOUTH THREE TIMES DAILY 90 capsule 2  ? Cholecalciferol (D3 ADULT PO) Take 2,000 Units by mouth daily.    ? colchicine 0.6 MG tablet Take 2 tablets at once then take 1 tablet an hour later then then 1 tablet twice daily x 3 days. 30 tablet 0  ? diazepam (VALIUM) 5 MG tablet Take 1 tablet (5 mg total) by mouth daily as needed for anxiety. 3 tablet 0  ? diclofenac Sodium (VOLTAREN) 1 % GEL Apply 4 g topically 4 (four) times daily. 350 g 1  ? docusate  sodium (COLACE) 100 MG capsule Take 1 capsule (100 mg total) by mouth 3 (three) times daily. (Patient taking differently: Take 100 mg by mouth 2 (two) times daily.) 10 capsule 0  ? DULoxetine (CYMBALTA) 60 MG capsule TAKE 1 CAPSULE(60 MG) BY MOUTH DAILY (Patient taking differently: Take 60 mg by mouth daily.) 90 capsule 1  ? Fluticasone-Umeclidin-Vilant ([name] ELLIPTA) 100-62.5-25 MCG/ACT AEPB Inhale 1 puff into the lungs daily. 60 each 3  ? HYDROcodone-acetaminophen (NORCO) 5-325 MG tablet Take 1 tablet by mouth 2 (two) times daily as needed. 20 tablet 0  ? levothyroxine (SYNTHROID) 75 MCG tablet TAKE 1 TABLET(75 MCG) BY MOUTH DAILY BEFORE BREAKFAST (Patient taking differently: Take 75 mcg by mouth daily before breakfast.) 90 tablet 1  ? Magnesium Gluconate 250 MG TABS Take 1 tablet by mouth daily.    ? metoprolol succinate (TOPROL-XL) 50 MG 24 hr tablet Take 1 tablet (50 mg total) by mouth daily. Take with or immediately following a meal. 30 tablet 2  ? polyethylene glycol powder (GLYCOLAX/MIRALAX) 17 GM/SCOOP powder Take 17 g by mouth daily as needed for moderate constipation.    ? primidone (MYSOLINE) 50 MG tablet TAKE 1 TABLET BY MOUTH TWICE DAILY (Patient taking differently: Take 50 mg by mouth 2 (two) times daily.) 180 tablet 3  ? Probiotic  Product (PROBIOTIC DAILY PO) Take 1 tablet by mouth daily.    ? Propylene Glycol (SYSTANE BALANCE OP) Place 1 drop into both eyes 4 (four) times daily as needed (dry eyes).    ? rOPINIRole (REQUIP) 1 MG tablet Take

## 2021-07-05 ENCOUNTER — Other Ambulatory Visit: Payer: Self-pay | Admitting: Medical

## 2021-07-05 ENCOUNTER — Telehealth: Payer: Self-pay

## 2021-07-05 DIAGNOSIS — J449 Chronic obstructive pulmonary disease, unspecified: Secondary | ICD-10-CM | POA: Diagnosis not present

## 2021-07-05 MED ORDER — PROMETHAZINE-DM 6.25-15 MG/5ML PO SYRP
5.0000 mL | ORAL_SOLUTION | Freq: Four times a day (QID) | ORAL | 0 refills | Status: DC | PRN
Start: 2021-07-05 — End: 2021-07-27

## 2021-07-05 NOTE — Telephone Encounter (Signed)
Pt. Called back stating she saw you virtually Friday. She stats her cough is worse now than it was. She has been unable to sleep at night because of all the coughing. She can not lay down with out coughing. She said the cough perles you called in have not touched it at all. She wanted to know if you could call her in something stronger like a cough syrup or something.  ?

## 2021-07-06 ENCOUNTER — Encounter: Payer: Self-pay | Admitting: Internal Medicine

## 2021-07-06 ENCOUNTER — Other Ambulatory Visit: Payer: Self-pay

## 2021-07-06 ENCOUNTER — Ambulatory Visit (INDEPENDENT_AMBULATORY_CARE_PROVIDER_SITE_OTHER): Payer: PPO | Admitting: Internal Medicine

## 2021-07-06 DIAGNOSIS — M4626 Osteomyelitis of vertebra, lumbar region: Secondary | ICD-10-CM

## 2021-07-06 NOTE — Progress Notes (Signed)
Virtual Visit via Telephone Note ? ?I connected with Brandi Stephens on 07/06/21 at  1:45 PM EDT by telephone and verified that I am speaking with the correct person using two identifiers. ? ?Location: ?Patient: Home ?Provider: [facility] ?  ?I discussed the limitations, risks, security and privacy concerns of performing an evaluation and management service by telephone and the availability of in person appointments. I also discussed with the patient that there may be a patient responsible charge related to this service. The patient expressed understanding and agreed to proceed. ? ? ?History of Present Illness: ?I called and spoke with Brandi Stephens today.  She continues taking chronic, suppressive cephalexin following her postoperative lumbar MSSA infection in March 2021.  She is not having any problems tolerating cephalexin.  She has been struggling with bad bronchitis and cough recently that has made her chronic back pain slightly worse. ?  ?Observations/Objective: ?Sed Rate (mm/h)  ?Date Value  ?11/03/2020 28  ?08/04/2020 28  ?06/10/2020 22  ? ?CRP (mg/L)  ?Date Value  ?11/03/2020 18.5 (H)  ?08/04/2020 18.5 (H)  ?06/10/2020 21.6 (H)  ?02/12/2019 <1  ?  ? ?Assessment and Plan: ?She will continue cephalexin and follow-up for lab work in 1 month ? ?Follow Up Instructions: ?Continue cephalexin ?Follow-up in 1 month ?  ?I discussed the assessment and treatment plan with the patient. The patient was provided an opportunity to ask questions and all were answered. The patient agreed with the plan and demonstrated an understanding of the instructions. ?  ?The patient was advised to call back or seek an in-person evaluation if the symptoms worsen or if the condition fails to improve as anticipated. ? ?I provided 14 minutes of non-face-to-face time during this encounter. ? ? ?[name], MD ? ?

## 2021-07-09 ENCOUNTER — Ambulatory Visit: Payer: PPO | Admitting: Internal Medicine

## 2021-07-13 ENCOUNTER — Encounter: Payer: PPO | Admitting: Physical Medicine and Rehabilitation

## 2021-07-13 ENCOUNTER — Other Ambulatory Visit: Payer: Self-pay | Admitting: Family Medicine

## 2021-07-14 DIAGNOSIS — M170 Bilateral primary osteoarthritis of knee: Secondary | ICD-10-CM | POA: Diagnosis not present

## 2021-07-14 DIAGNOSIS — F418 Other specified anxiety disorders: Secondary | ICD-10-CM | POA: Diagnosis not present

## 2021-07-14 DIAGNOSIS — M48061 Spinal stenosis, lumbar region without neurogenic claudication: Secondary | ICD-10-CM | POA: Diagnosis not present

## 2021-07-14 DIAGNOSIS — K6282 Dysplasia of anus: Secondary | ICD-10-CM | POA: Diagnosis not present

## 2021-07-14 DIAGNOSIS — M47816 Spondylosis without myelopathy or radiculopathy, lumbar region: Secondary | ICD-10-CM | POA: Diagnosis not present

## 2021-07-14 DIAGNOSIS — E039 Hypothyroidism, unspecified: Secondary | ICD-10-CM | POA: Diagnosis not present

## 2021-07-14 DIAGNOSIS — K5909 Other constipation: Secondary | ICD-10-CM | POA: Diagnosis not present

## 2021-07-14 DIAGNOSIS — G8929 Other chronic pain: Secondary | ICD-10-CM | POA: Diagnosis not present

## 2021-07-14 DIAGNOSIS — K219 Gastro-esophageal reflux disease without esophagitis: Secondary | ICD-10-CM | POA: Diagnosis not present

## 2021-07-14 DIAGNOSIS — Z9981 Dependence on supplemental oxygen: Secondary | ICD-10-CM | POA: Diagnosis not present

## 2021-07-14 DIAGNOSIS — Z87891 Personal history of nicotine dependence: Secondary | ICD-10-CM | POA: Diagnosis not present

## 2021-07-14 DIAGNOSIS — E785 Hyperlipidemia, unspecified: Secondary | ICD-10-CM | POA: Diagnosis not present

## 2021-07-14 DIAGNOSIS — G2581 Restless legs syndrome: Secondary | ICD-10-CM | POA: Diagnosis not present

## 2021-07-14 DIAGNOSIS — E1142 Type 2 diabetes mellitus with diabetic polyneuropathy: Secondary | ICD-10-CM | POA: Diagnosis not present

## 2021-07-14 DIAGNOSIS — N1831 Chronic kidney disease, stage 3a: Secondary | ICD-10-CM | POA: Diagnosis not present

## 2021-07-14 DIAGNOSIS — J439 Emphysema, unspecified: Secondary | ICD-10-CM | POA: Diagnosis not present

## 2021-07-14 DIAGNOSIS — M109 Gout, unspecified: Secondary | ICD-10-CM | POA: Diagnosis not present

## 2021-07-14 DIAGNOSIS — I130 Hypertensive heart and chronic kidney disease with heart failure and stage 1 through stage 4 chronic kidney disease, or unspecified chronic kidney disease: Secondary | ICD-10-CM | POA: Diagnosis not present

## 2021-07-14 DIAGNOSIS — I872 Venous insufficiency (chronic) (peripheral): Secondary | ICD-10-CM | POA: Diagnosis not present

## 2021-07-14 DIAGNOSIS — E1122 Type 2 diabetes mellitus with diabetic chronic kidney disease: Secondary | ICD-10-CM | POA: Diagnosis not present

## 2021-07-14 DIAGNOSIS — I5032 Chronic diastolic (congestive) heart failure: Secondary | ICD-10-CM | POA: Diagnosis not present

## 2021-07-14 DIAGNOSIS — G250 Essential tremor: Secondary | ICD-10-CM | POA: Diagnosis not present

## 2021-07-14 DIAGNOSIS — E1151 Type 2 diabetes mellitus with diabetic peripheral angiopathy without gangrene: Secondary | ICD-10-CM | POA: Diagnosis not present

## 2021-07-21 DIAGNOSIS — J439 Emphysema, unspecified: Secondary | ICD-10-CM | POA: Diagnosis not present

## 2021-07-21 DIAGNOSIS — I5032 Chronic diastolic (congestive) heart failure: Secondary | ICD-10-CM | POA: Diagnosis not present

## 2021-07-21 DIAGNOSIS — I130 Hypertensive heart and chronic kidney disease with heart failure and stage 1 through stage 4 chronic kidney disease, or unspecified chronic kidney disease: Secondary | ICD-10-CM | POA: Diagnosis not present

## 2021-07-21 DIAGNOSIS — E785 Hyperlipidemia, unspecified: Secondary | ICD-10-CM | POA: Diagnosis not present

## 2021-07-21 DIAGNOSIS — G250 Essential tremor: Secondary | ICD-10-CM | POA: Diagnosis not present

## 2021-07-21 DIAGNOSIS — K6282 Dysplasia of anus: Secondary | ICD-10-CM | POA: Diagnosis not present

## 2021-07-21 DIAGNOSIS — K219 Gastro-esophageal reflux disease without esophagitis: Secondary | ICD-10-CM | POA: Diagnosis not present

## 2021-07-21 DIAGNOSIS — M170 Bilateral primary osteoarthritis of knee: Secondary | ICD-10-CM | POA: Diagnosis not present

## 2021-07-21 DIAGNOSIS — Z9981 Dependence on supplemental oxygen: Secondary | ICD-10-CM | POA: Diagnosis not present

## 2021-07-21 DIAGNOSIS — G8929 Other chronic pain: Secondary | ICD-10-CM | POA: Diagnosis not present

## 2021-07-21 DIAGNOSIS — E1122 Type 2 diabetes mellitus with diabetic chronic kidney disease: Secondary | ICD-10-CM | POA: Diagnosis not present

## 2021-07-21 DIAGNOSIS — K5909 Other constipation: Secondary | ICD-10-CM | POA: Diagnosis not present

## 2021-07-21 DIAGNOSIS — G2581 Restless legs syndrome: Secondary | ICD-10-CM | POA: Diagnosis not present

## 2021-07-21 DIAGNOSIS — E039 Hypothyroidism, unspecified: Secondary | ICD-10-CM | POA: Diagnosis not present

## 2021-07-21 DIAGNOSIS — I872 Venous insufficiency (chronic) (peripheral): Secondary | ICD-10-CM | POA: Diagnosis not present

## 2021-07-21 DIAGNOSIS — Z87891 Personal history of nicotine dependence: Secondary | ICD-10-CM | POA: Diagnosis not present

## 2021-07-21 DIAGNOSIS — N1831 Chronic kidney disease, stage 3a: Secondary | ICD-10-CM | POA: Diagnosis not present

## 2021-07-21 DIAGNOSIS — F418 Other specified anxiety disorders: Secondary | ICD-10-CM | POA: Diagnosis not present

## 2021-07-21 DIAGNOSIS — E1151 Type 2 diabetes mellitus with diabetic peripheral angiopathy without gangrene: Secondary | ICD-10-CM | POA: Diagnosis not present

## 2021-07-21 DIAGNOSIS — M47816 Spondylosis without myelopathy or radiculopathy, lumbar region: Secondary | ICD-10-CM | POA: Diagnosis not present

## 2021-07-21 DIAGNOSIS — M48061 Spinal stenosis, lumbar region without neurogenic claudication: Secondary | ICD-10-CM | POA: Diagnosis not present

## 2021-07-21 DIAGNOSIS — E1142 Type 2 diabetes mellitus with diabetic polyneuropathy: Secondary | ICD-10-CM | POA: Diagnosis not present

## 2021-07-21 DIAGNOSIS — M109 Gout, unspecified: Secondary | ICD-10-CM | POA: Diagnosis not present

## 2021-07-22 ENCOUNTER — Telehealth: Payer: Self-pay

## 2021-07-22 NOTE — Telephone Encounter (Signed)
Recv'd fax regarding Tiering exceptions for METOPROLOL & LEVOTHYROXINE,  left message for pt to see what she has tried in the past for HTN & also to see if she has picked up this medication or is out.  Called pharmacy pt picked up METOPROLOL 3/17 $11 & LEVOTHYROXINE 3/30 $30.  So she does need refills and I will work on the [employer] exceptions

## 2021-07-23 NOTE — Telephone Encounter (Signed)
Per Dr. Redmond School he is ok with switched her to Metoprolol tartrate verses succinate if insurance and pt prefers, will see if insurance denies tiering exception first

## 2021-07-26 DIAGNOSIS — J449 Chronic obstructive pulmonary disease, unspecified: Secondary | ICD-10-CM | POA: Diagnosis not present

## 2021-07-26 DIAGNOSIS — N1831 Chronic kidney disease, stage 3a: Secondary | ICD-10-CM | POA: Diagnosis not present

## 2021-07-26 MED ORDER — METOPROLOL TARTRATE 25 MG PO TABS: 25.0000 mg | ORAL_TABLET | Freq: Two times a day (BID) | ORAL | 3 refills | Status: DC

## 2021-07-26 NOTE — Telephone Encounter (Signed)
Pt is going with switching to Metroprolol Tartrate. Please send in ?

## 2021-07-27 ENCOUNTER — Ambulatory Visit: Payer: PPO | Admitting: Pulmonary Disease

## 2021-07-27 ENCOUNTER — Encounter: Payer: Self-pay | Admitting: Pulmonary Disease

## 2021-07-27 ENCOUNTER — Other Ambulatory Visit: Payer: Self-pay | Admitting: Medical

## 2021-07-27 VITALS — BP 118/68 | HR 106 | Temp 97.9°F | Ht 68.0 in | Wt 266.0 lb

## 2021-07-27 DIAGNOSIS — J9601 Acute respiratory failure with hypoxia: Secondary | ICD-10-CM

## 2021-07-27 DIAGNOSIS — R0602 Shortness of breath: Secondary | ICD-10-CM | POA: Diagnosis not present

## 2021-07-27 DIAGNOSIS — G2581 Restless legs syndrome: Secondary | ICD-10-CM

## 2021-07-27 NOTE — Patient Instructions (Signed)
Continue using [name] daily ? ?Albuterol use as needed ? ?I will see you back in about 6 months ? ?Continue oxygen use and check your pulse oximeter to guide you regarding oxygen use ? ?Call with significant concerns ?

## 2021-07-27 NOTE — Progress Notes (Signed)
? ?      ?Brandi Stephens    [id number]    May 14, 1945 ? ?Primary Care Physician:Tysinger, Camelia Eng, PA-C ? ?Referring Physician: Carlena Hurl, PA-C ?[address] ?[place],  [place] [address] ? ?Chief complaint:   ?Patient with recurrent bronchitis ?History of COPD ? ?HPI: ? ?No recent hospitalization ? ?Did have a recent exacerbation requiring antibiotics and steroids ? ?Trying to stay active ? ?History of COPD, diastolic heart failure ? ?Multiple back surgeries with fusion ?Continues to have pain and discomfort in her back ? ?She does ambulate with a walker ?Was switched to [name] and this seems to be working well for her ? ? ?She uses [name] daily, Uses albuterol as needed ? ?Shortness of breath with activities-multifactorial ? ?Quit smoking about a couple years ago ? ?She has very significant/severe back pain-surgery has been canceled on a couple uppercase ? ?She was able to quit smoking for about 8 years in the past ? ?No pertinent occupational history ? ?She was diagnosed with COPD about 2017-at the time she did have sepsis ? ?History of breast cancer for which she received radiation treatments in the past ? ?Outpatient Encounter Medications as of 07/27/2021  ?Medication Sig  ? albuterol (VENTOLIN HFA) 108 (90 Base) MCG/ACT inhaler INHALE 2 PUFFS BY MOUTH EVERY 4 TO 6 HOURS AS NEEDED (Patient taking differently: Inhale 2 puffs into the lungs every 4 (four) hours as needed for wheezing.)  ? allopurinol (ZYLOPRIM) 300 MG tablet 1 daily (Patient taking differently: Take 300 mg by mouth daily. 1 daily)  ? amitriptyline (ELAVIL) 75 MG tablet TAKE 1 TABLET(75 MG) BY MOUTH AT BEDTIME (Patient taking differently: Take 75 mg by mouth at bedtime.)  ? atorvastatin (LIPITOR) 80 MG tablet Take 1 tablet (80 mg total) by mouth daily.  ? baclofen (LIORESAL) 10 MG tablet Take 1 tablet (10 mg total) by mouth 2 (two) times daily.  ? betamethasone dipropionate 0.05 % cream Apply 1 application topically daily as needed  (irritated skin).  ? Biotin 3 MG TABS Take 3 mg by mouth daily.  ? blood glucose meter kit and supplies KIT Dispense based on patient and insurance preference. Use up to four times daily as directed.  ? bumetanide (BUMEX) 1 MG tablet Take 1 mg by mouth daily.  ? cephALEXin (KEFLEX) 500 MG capsule TAKE 1 CAPSULE(500 MG) BY MOUTH THREE TIMES DAILY  ? Cholecalciferol (D3 ADULT PO) Take 2,000 Units by mouth daily.  ? colchicine 0.6 MG tablet Take 2 tablets at once then take 1 tablet an hour later then then 1 tablet twice daily x 3 days.  ? diazepam (VALIUM) 5 MG tablet Take 1 tablet (5 mg total) by mouth daily as needed for anxiety.  ? diclofenac Sodium (VOLTAREN) 1 % GEL Apply 4 g topically 4 (four) times daily.  ? docusate sodium (COLACE) 100 MG capsule Take 1 capsule (100 mg total) by mouth 3 (three) times daily. (Patient taking differently: Take 100 mg by mouth 2 (two) times daily.)  ? DULoxetine (CYMBALTA) 60 MG capsule TAKE 1 CAPSULE(60 MG) BY MOUTH DAILY (Patient taking differently: Take 60 mg by mouth daily.)  ? Fluticasone-Umeclidin-Vilant ([name] ELLIPTA) 100-62.5-25 MCG/ACT AEPB Inhale 1 puff into the lungs daily.  ? levothyroxine (SYNTHROID) 75 MCG tablet Take 1 tablet (75 mcg total) by mouth daily before breakfast.  ? Magnesium Gluconate 250 MG TABS Take 1 tablet by mouth daily.  ? metoprolol tartrate (LOPRESSOR) 25 MG tablet Take 1 tablet (25 mg total)  by mouth 2 (two) times daily.  ? polyethylene glycol powder (GLYCOLAX/MIRALAX) 17 GM/SCOOP powder Take 17 g by mouth daily as needed for moderate constipation.  ? primidone (MYSOLINE) 50 MG tablet TAKE 1 TABLET BY MOUTH TWICE DAILY (Patient taking differently: Take 50 mg by mouth 2 (two) times daily.)  ? Probiotic Product (PROBIOTIC DAILY PO) Take 1 tablet by mouth daily.  ? Propylene Glycol (SYSTANE BALANCE OP) Place 1 drop into both eyes 4 (four) times daily as needed (dry eyes).  ? rOPINIRole (REQUIP) 1 MG tablet Take 1 tablet (1 mg total) by mouth at  bedtime.  ? topiramate (TOPAMAX) 25 MG tablet Take 1 tablet (25 mg total) by mouth at bedtime.  ? TRULICITY 3.66 YQ/0.3KV SOPN ADMINISTER 0.75 MG UNDER THE SKIN 1 TIME A WEEK  ? vitamin B-12 (CYANOCOBALAMIN) 1000 MCG tablet Take 1 tablet (1,000 mcg total) by mouth daily.  ? [DISCONTINUED] clarithromycin (BIAXIN) 500 MG tablet Take 1 tablet (500 mg total) by mouth 2 (two) times daily.  ? [DISCONTINUED] clindamycin (CLEOCIN) 150 MG capsule Take 150 mg by mouth 4 (four) times daily.  ? [DISCONTINUED] HYDROcodone-acetaminophen (NORCO) 5-325 MG tablet Take 1 tablet by mouth 2 (two) times daily as needed. (Patient not taking: Reported on 07/27/2021)  ? [DISCONTINUED] predniSONE (DELTASONE) 20 MG tablet 3 tablets daily for 3 days, then 2 tablets daily for 3 days, then 1 tablet daily for 3 days, then 1/2 tablet daily for 3 days. (Patient not taking: Reported on 07/27/2021)  ? [DISCONTINUED] promethazine-dextromethorphan (PROMETHAZINE-DM) 6.25-15 MG/5ML syrup Take 5 mLs by mouth 4 (four) times daily as needed for cough. (Patient not taking: Reported on 07/27/2021)  ? ?No facility-administered encounter medications on file as of 07/27/2021.  ? ? ?Allergies as of 07/27/2021 - Review Complete 07/27/2021  ?Allergen Reaction Noted  ? Ampicillin Hives and Other (See Comments) 09/30/2015  ? Gabapentin Swelling 02/12/2019  ? Penicillins Hives and Other (See Comments) 03/11/2011  ? Codeine Nausea And Vomiting 03/11/2011  ? Other Itching 02/26/2018  ? ? ?Past Medical History:  ?Diagnosis Date  ? AIN (anal intraepithelial neoplasia) anal canal   ? Anemia   ? with pregnancy  ? Anxiety   ? Arthritis   ? knees, lower back, knees  ? Chronic constipation   ? Chronic diastolic CHF (congestive heart failure) ([place]) 07/05/2019  ? CKD (chronic kidney disease)   ? Coarse tremors   ? Essential  ? COPD (chronic obstructive pulmonary disease) ([place])   ? 2017 chest CT, history of  ? Degenerative lumbar spinal stenosis   ? Depression   ? Depression  07/05/2019  ? Drug dependency ([place])   ? Elevated PTHrP level 05/09/2019  ? ETOH abuse 07/05/2019  ? GERD (gastroesophageal reflux disease)   ? pt denies  ? Gout   ? Grade I diastolic dysfunction 42/59/5638  ? Noted ECHO  ? Hemorrhoids   ? History of adenomatous polyp of colon   ? History of basal cell carcinoma (BCC) excision 2015  ? nose  ? History of sepsis 05/14/2014  ? post lumbar surgery  ? History of shingles   ? x2  ? Hyperlipidemia   ? Hypertension   ? Hypothyroidism   ? Irregular heart beat   ? history of while undergoing radiation  ? Malignant neoplasm of upper-inner quadrant of left breast in female, estrogen receptor positive [facility]) oncologist-  dr Jana Hakim--  per lov in epic no recurrence  ? dx 07-13-2015  Left breast invasive ductal carcinoma, Stage IA,  Grade 1 (TXN0),  09-16-2015  s/p  left breast lumpectomy with sln bx's,  completed radiation 11-18-2015,  started antiestrogen therapy 12-14-2015  ? Neuropathy   ? Left foot and back of left leg  ? Obesity (BMI 30-39.9) 07/05/2019  ? Personal history of radiation therapy   ? completed 11-18-2015  left breast  ? Pneumonia   ? PONV (postoperative nausea and vomiting)   ? Prediabetes   ? diet controlled, no med  ? Restless leg syndrome   ? Seasonal allergies   ? Seizure ([place]) 05/2015  ? due to sepsis, only one time episode  ? Tremor   ? Wears partial dentures   ? lower   ? ? ?Past Surgical History:  ?Procedure Laterality Date  ? ABDOMINAL HYSTERECTOMY  1985  ? BASAL CELL CARCINOMA EXCISION  10/15  ? nose  ? BREAST BIOPSY Right 08/24/2015  ? BREAST BIOPSY Right 07/13/2015  ? BREAST BIOPSY Left 07/13/2015  ? BREAST EXCISIONAL BIOPSY Left 1972  ? BREAST LUMPECTOMY Left   ? BREAST LUMPECTOMY WITH RADIOACTIVE SEED AND SENTINEL LYMPH NODE BIOPSY Left 09/16/2015  ? Procedure: BREAST LUMPECTOMY WITH RADIOACTIVE SEED AND SENTINEL LYMPH NODE BIOPSY;  Surgeon: Stark Klein, MD;  Location: [facility];  Service: General;  Laterality: Left;  ? BREAST REDUCTION  SURGERY Bilateral 1998  ? CATARACT EXTRACTION W/ INTRAOCULAR LENS IMPLANT Left 2016  ? [place]  ? hallo  ? COLONOSCOPY  01/2013  ? polyps  ? EYE SURGERY Left 2016  ? HAND SURGERY Left 11-04

## 2021-07-31 ENCOUNTER — Other Ambulatory Visit: Payer: Self-pay | Admitting: Family Medicine

## 2021-07-31 ENCOUNTER — Other Ambulatory Visit: Payer: Self-pay | Admitting: Medical

## 2021-07-31 DIAGNOSIS — G2581 Restless legs syndrome: Secondary | ICD-10-CM

## 2021-08-03 ENCOUNTER — Other Ambulatory Visit: Payer: Self-pay

## 2021-08-03 DIAGNOSIS — M4626 Osteomyelitis of vertebra, lumbar region: Secondary | ICD-10-CM

## 2021-08-03 MED ORDER — CEPHALEXIN 500 MG PO CAPS: ORAL_CAPSULE | ORAL | 0 refills | Status: DC

## 2021-08-05 ENCOUNTER — Telehealth (INDEPENDENT_AMBULATORY_CARE_PROVIDER_SITE_OTHER): Payer: PPO | Admitting: Medical

## 2021-08-05 VITALS — HR 60 | Wt 265.0 lb

## 2021-08-05 DIAGNOSIS — I700 Atherosclerosis of aorta: Secondary | ICD-10-CM | POA: Diagnosis not present

## 2021-08-05 DIAGNOSIS — I5032 Chronic diastolic (congestive) heart failure: Secondary | ICD-10-CM | POA: Diagnosis not present

## 2021-08-05 DIAGNOSIS — F419 Anxiety disorder, unspecified: Secondary | ICD-10-CM

## 2021-08-05 DIAGNOSIS — I10 Essential (primary) hypertension: Secondary | ICD-10-CM | POA: Diagnosis not present

## 2021-08-05 DIAGNOSIS — J431 Panlobular emphysema: Secondary | ICD-10-CM | POA: Diagnosis not present

## 2021-08-05 DIAGNOSIS — E039 Hypothyroidism, unspecified: Secondary | ICD-10-CM

## 2021-08-05 DIAGNOSIS — F1010 Alcohol abuse, uncomplicated: Secondary | ICD-10-CM

## 2021-08-05 DIAGNOSIS — F320 Major depressive disorder, single episode, mild: Secondary | ICD-10-CM | POA: Diagnosis not present

## 2021-08-05 DIAGNOSIS — J449 Chronic obstructive pulmonary disease, unspecified: Secondary | ICD-10-CM | POA: Diagnosis not present

## 2021-08-05 DIAGNOSIS — M5416 Radiculopathy, lumbar region: Secondary | ICD-10-CM

## 2021-08-05 DIAGNOSIS — M1000 Idiopathic gout, unspecified site: Secondary | ICD-10-CM

## 2021-08-05 DIAGNOSIS — R296 Repeated falls: Secondary | ICD-10-CM

## 2021-08-05 DIAGNOSIS — G894 Chronic pain syndrome: Secondary | ICD-10-CM | POA: Diagnosis not present

## 2021-08-05 DIAGNOSIS — E782 Mixed hyperlipidemia: Secondary | ICD-10-CM

## 2021-08-05 DIAGNOSIS — G8929 Other chronic pain: Secondary | ICD-10-CM

## 2021-08-05 DIAGNOSIS — R609 Edema, unspecified: Secondary | ICD-10-CM

## 2021-08-05 DIAGNOSIS — J9611 Chronic respiratory failure with hypoxia: Secondary | ICD-10-CM

## 2021-08-05 DIAGNOSIS — Z87891 Personal history of nicotine dependence: Secondary | ICD-10-CM

## 2021-08-05 DIAGNOSIS — Z720 Tobacco use: Secondary | ICD-10-CM

## 2021-08-05 DIAGNOSIS — R29898 Other symptoms and signs involving the musculoskeletal system: Secondary | ICD-10-CM

## 2021-08-05 DIAGNOSIS — N1831 Chronic kidney disease, stage 3a: Secondary | ICD-10-CM | POA: Diagnosis not present

## 2021-08-05 DIAGNOSIS — M62838 Other muscle spasm: Secondary | ICD-10-CM

## 2021-08-05 DIAGNOSIS — E569 Vitamin deficiency, unspecified: Secondary | ICD-10-CM

## 2021-08-05 DIAGNOSIS — K5909 Other constipation: Secondary | ICD-10-CM

## 2021-08-05 DIAGNOSIS — E118 Type 2 diabetes mellitus with unspecified complications: Secondary | ICD-10-CM

## 2021-08-05 DIAGNOSIS — E790 Hyperuricemia without signs of inflammatory arthritis and tophaceous disease: Secondary | ICD-10-CM

## 2021-08-05 DIAGNOSIS — Z7185 Encounter for immunization safety counseling: Secondary | ICD-10-CM

## 2021-08-05 DIAGNOSIS — R251 Tremor, unspecified: Secondary | ICD-10-CM

## 2021-08-05 NOTE — Assessment & Plan Note (Signed)
She notes history of heavier use in the past but drinks a few glasses of wine per week now ?

## 2021-08-05 NOTE — Assessment & Plan Note (Signed)
We are going to look into adding a GLP-1 medication like Ozempic to help with diabetes and weight loss and heart benefit ?

## 2021-08-05 NOTE — Assessment & Plan Note (Signed)
She continues Valium as needed.  We will likely need to just remove baclofen from the list so she does not have another medicine to confuse things.  She already has polypharmacy ?

## 2021-08-05 NOTE — Assessment & Plan Note (Signed)
She reports taking allopurinol but when I checked the refill dates she should be out of this.  Maybe she has leftover medicine from prior refills.  Uses colchicine as needed.  When she comes in next week for labs we will update uric acid level. ?

## 2021-08-05 NOTE — Progress Notes (Signed)
?Subjective:  ?  ? Patient ID: Brandi Stephens, female   DOB: Jan 26, 1946, 76 y.o.   MRN: [id number] ? ?This visit type was conducted due to national recommendations for restrictions regarding the COVID-19 Pandemic (e.g. social distancing) in an effort to limit this patient's exposure and mitigate transmission in our community.  Due to their co-morbid illnesses, this patient is at least at moderate risk for complications without adequate follow up.  This format is felt to be most appropriate for this patient at this time.   ? ?Documentation for virtual audio and video telecommunications through [place] encounter: ? ?The patient was located at home. ?The provider was located in the office. ?The patient did consent to this visit and is aware of possible charges through their insurance for this visit. ? ?The other persons participating in this telemedicine service were none. ?Time spent on call was 20 minutes and in review of previous records 20 minutes total. ? ?This virtual service is not related to other E/M service within previous 7 days. ? ? ?HPI ?Chief Complaint  ?Patient presents with  ? med check  ?  Med check- pt fell the other day- falling alot  ? ?Patient Care Team: ?Nami Strawder, Leward Quan as PCP - General (Family Medicine) ?[name], MD as PCP - Cardiology (Cardiology) ?Magrinat, Virgie Dad, MD as Consulting Physician (Oncology) ?Stark Klein, MD as Consulting Physician (General Surgery) ?[name], MD as Consulting Physician (Family Medicine) ?[name], MD as Consulting Physician (Neurosurgery) ?Griselda Miner, MD as Consulting Physician (Dermatology) ?Gardenia Phlegm, NP as Nurse Practitioner (Hematology and Oncology) ?Marchia Bond, MD as Consulting Physician (Orthopedic Surgery) ?Clydell Hakim, MD (Inactive) as Consulting Physician (Anesthesiology) ?Armbruster, Carlota Raspberry, MD as Consulting Physician (Gastroenterology) ?[name], MD as Consulting Physician  (Neurosurgery) ?Marcial Pacas, MD as Consulting Physician (Neurology) ?Viona Gilmore, [facility] as Pharmacist (Pharmacist) ?Dr. Sherrilyn Rist, pulmonology ?Dr. Leeroy Cha, phys med ? ? ?Concerns: ?She notes several falls recently.  Has bad neuropathy in left leg since first back surgery 03/2014.  Has had 3 other back surgeries since then.  Every time she has surgery seems to get worse.   The other day was in a chair next to her bed, got up to change places, and felt like feet went out from under her.   She did have some dizziness as well.   No saddle anesthesia.   Has control over bowels, but is having constipation.   Using miralax daily to help.   Was on linzess prior which worked better.   She worries about making it to toilet with using linzess though.    ? ?She denies bladder incontinence. ? ?She is checking glucose daily fasting.  Fasting glucose is around 120 up to 168.  Generally thought closer to 130.    ? ?Hasn't been checking BP as she had home health coming in recently.   She does have BP cuff at home.   Seeing normal to low readings.   Sometimes as low as 108 SBP.  DBP typically in the 70s.   ? ?She note ongoing fluid swelling in legs.  Cardiology changed her from lasix to different diuretic recently.   ? ?Home health was coming out recently, had nurse and physical therapist coming out.  This was finished last week.   ? ?She does have 16 stairs in house.  Is very careful when using stairs though.    ? ?She is a former smoker.  Quit about 3 years ago.  Drinks some alcohol, not as much in the past.   Has a glass of wine a few times per week.  ? ?She has chronic pain and is requesting some way to deal with her pain.  States being in pain quite frequently.  We reviewed over some of her medications.  Vitamins listed in the chart record which she uses periodically.  She also uses baclofen muscle relaxer's.  She notes that if she had a choice she could do without the baclofen and keep the Valium for as  needed use.  She is also on Topamax and Cymbalta.  She said Cymbalta 30 mg was not effective but when she was bumped up to 60 mg this really helped some of her neuropathy symptoms. ? ?Regarding constipation she takes Colace 3 times a day and MiraLAX as needed. ? ?She is currently using trulicity ? ?Past Medical History:  ?Diagnosis Date  ? AIN (anal intraepithelial neoplasia) anal canal   ? Anemia   ? with pregnancy  ? Anxiety   ? Arthritis   ? knees, lower back, knees  ? Chronic constipation   ? Chronic diastolic CHF (congestive heart failure) ([place]) 07/05/2019  ? CKD (chronic kidney disease)   ? Coarse tremors   ? Essential  ? COPD (chronic obstructive pulmonary disease) ([place])   ? 2017 chest CT, history of  ? Degenerative lumbar spinal stenosis   ? Depression   ? Depression 07/05/2019  ? Drug dependency ([place])   ? Elevated PTHrP level 05/09/2019  ? ETOH abuse 07/05/2019  ? GERD (gastroesophageal reflux disease)   ? pt denies  ? Gout   ? Grade I diastolic dysfunction 94/17/4081  ? Noted ECHO  ? Hemorrhoids   ? History of adenomatous polyp of colon   ? History of basal cell carcinoma (BCC) excision 2015  ? nose  ? History of sepsis 05/14/2014  ? post lumbar surgery  ? History of shingles   ? x2  ? Hyperlipidemia   ? Hypertension   ? Hypothyroidism   ? Irregular heart beat   ? history of while undergoing radiation  ? Malignant neoplasm of upper-inner quadrant of left breast in female, estrogen receptor positive [facility]) oncologist-  dr Jana Hakim--  per lov in epic no recurrence  ? dx 07-13-2015  Left breast invasive ductal carcinoma, Stage IA, Grade 1 (TXN0),  09-16-2015  s/p  left breast lumpectomy with sln bx's,  completed radiation 11-18-2015,  started antiestrogen therapy 12-14-2015  ? Neuropathy   ? Left foot and back of left leg  ? Obesity (BMI 30-39.9) 07/05/2019  ? Personal history of radiation therapy   ? completed 11-18-2015  left breast  ? Pneumonia   ? PONV (postoperative nausea and vomiting)   ? Prediabetes   ?  diet controlled, no med  ? Restless leg syndrome   ? Seasonal allergies   ? Seizure ([place]) 05/2015  ? due to sepsis, only one time episode  ? Tremor   ? Wears partial dentures   ? lower   ? ?Current Outpatient Medications on File Prior to Visit  ?Medication Sig Dispense Refill  ? albuterol (VENTOLIN HFA) 108 (90 Base) MCG/ACT inhaler INHALE 2 PUFFS BY MOUTH EVERY 4 TO 6 HOURS AS NEEDED (Patient taking differently: Inhale 2 puffs into the lungs every 4 (four) hours as needed for wheezing.) 42.5 g 0  ? amitriptyline (ELAVIL) 75 MG tablet TAKE 1 TABLET(75 MG) BY MOUTH AT BEDTIME (Patient taking differently: Take 75 mg by mouth at  bedtime.) 90 tablet 3  ? atorvastatin (LIPITOR) 80 MG tablet Take 1 tablet (80 mg total) by mouth daily. 90 tablet 3  ? betamethasone dipropionate 0.05 % cream Apply 1 application topically daily as needed (irritated skin).    ? Biotin 3 MG TABS Take 3 mg by mouth daily.    ? blood glucose meter kit and supplies KIT Dispense based on patient and insurance preference. Use up to four times daily as directed. 1 each 0  ? bumetanide (BUMEX) 1 MG tablet Take 1 mg by mouth daily.    ? cephALEXin (KEFLEX) 500 MG capsule TAKE 1 CAPSULE(500 MG) BY MOUTH THREE TIMES DAILY 90 capsule 0  ? DULoxetine (CYMBALTA) 60 MG capsule Take 1 capsule (60 mg total) by mouth daily. 90 capsule 0  ? Fluticasone-Umeclidin-Vilant ([name] ELLIPTA) 100-62.5-25 MCG/ACT AEPB Inhale 1 puff into the lungs daily. 60 each 3  ? levothyroxine (SYNTHROID) 75 MCG tablet Take 1 tablet (75 mcg total) by mouth daily before breakfast. 90 tablet 0  ? Magnesium Gluconate 250 MG TABS Take 1 tablet by mouth daily.    ? metoprolol tartrate (LOPRESSOR) 25 MG tablet Take 1 tablet (25 mg total) by mouth 2 (two) times daily. 180 tablet 3  ? polyethylene glycol powder (GLYCOLAX/MIRALAX) 17 GM/SCOOP powder Take 17 g by mouth daily as needed for moderate constipation.    ? primidone (MYSOLINE) 50 MG tablet TAKE 1 TABLET BY MOUTH TWICE DAILY (Patient  taking differently: Take 50 mg by mouth 2 (two) times daily.) 180 tablet 3  ? Probiotic Product (PROBIOTIC DAILY PO) Take 1 tablet by mouth daily.    ? Propylene Glycol (SYSTANE BALANCE OP) Place 1 drop into b

## 2021-08-05 NOTE — Assessment & Plan Note (Signed)
On primidone long-term through phys med ? ?

## 2021-08-05 NOTE — Assessment & Plan Note (Signed)
I recommended updated Prevnar 20 vaccine when she comes in for labs next week ? ?

## 2021-08-05 NOTE — Assessment & Plan Note (Signed)
Currently on Colace, MiraLAX, and occasional use of Linzess.  I will review of her recent pharmacy review of chart for advice.  It would be nice to narrow her medicines down to 1 medication instead of 2 or 3.  Continue good hydration ?

## 2021-08-05 NOTE — Assessment & Plan Note (Signed)
No recent changes in medications.  Blood pressure for the most part stable per home readings ?

## 2021-08-05 NOTE — Assessment & Plan Note (Signed)
She will return next week for updated labs ?

## 2021-08-05 NOTE — Assessment & Plan Note (Signed)
Updated labs today ?

## 2021-08-05 NOTE — Assessment & Plan Note (Signed)
She remains abstinent from smoking ?

## 2021-08-05 NOTE — Assessment & Plan Note (Signed)
Same plan as elevated uric ?

## 2021-08-05 NOTE — Assessment & Plan Note (Signed)
Consider contacting her with her phys med specialist to coordinate management.  We discussed the fact that she is on a couple things already to help with some of her pains and spasms such as Topamax, baclofen, Valium, Cymbalta.  I do not like the idea of chronic narcotics but we will coordinate with phys med ?

## 2021-08-05 NOTE — Assessment & Plan Note (Signed)
Currently on Trulicity.  Look into changing to Ozempic weekly with or without Trulicity.  She will come in next week for fasting labs ?

## 2021-08-05 NOTE — Assessment & Plan Note (Signed)
Recent worse swelling, recent diuretic changed to Bumex per cardiology ?

## 2021-08-05 NOTE — Assessment & Plan Note (Signed)
She recently completed several weeks of physical therapy through home health.  Continue home exercises ?

## 2021-08-05 NOTE — Assessment & Plan Note (Signed)
Managed by pulmonology, on oxygen ?

## 2021-08-07 ENCOUNTER — Other Ambulatory Visit: Payer: Self-pay | Admitting: Medical

## 2021-08-10 ENCOUNTER — Other Ambulatory Visit: Payer: Self-pay

## 2021-08-10 ENCOUNTER — Ambulatory Visit (INDEPENDENT_AMBULATORY_CARE_PROVIDER_SITE_OTHER): Payer: PPO | Admitting: Internal Medicine

## 2021-08-10 ENCOUNTER — Encounter: Payer: PPO | Attending: Physical Medicine and Rehabilitation | Admitting: Physical Medicine and Rehabilitation

## 2021-08-10 ENCOUNTER — Telehealth: Payer: Self-pay | Admitting: Medical

## 2021-08-10 DIAGNOSIS — G894 Chronic pain syndrome: Secondary | ICD-10-CM | POA: Diagnosis not present

## 2021-08-10 DIAGNOSIS — M4626 Osteomyelitis of vertebra, lumbar region: Secondary | ICD-10-CM | POA: Diagnosis not present

## 2021-08-10 DIAGNOSIS — F411 Generalized anxiety disorder: Secondary | ICD-10-CM | POA: Diagnosis not present

## 2021-08-10 MED ORDER — COLCHICINE 0.6 MG PO TABS: ORAL_TABLET | ORAL | 2 refills | Status: DC

## 2021-08-10 MED ORDER — CEFADROXIL 500 MG PO CAPS: 500.0000 mg | ORAL_CAPSULE | Freq: Two times a day (BID) | ORAL | 3 refills | Status: DC

## 2021-08-10 MED ORDER — TRULICITY 1.5 MG/0.5ML ~~LOC~~ SOAJ: 1.5000 mg | SUBCUTANEOUS | 5 refills | Status: DC

## 2021-08-10 MED ORDER — VITAMIN D3 50 MCG (2000 UT) PO CAPS
2000.0000 [IU] | ORAL_CAPSULE | Freq: Every day | ORAL | 3 refills | Status: DC
Start: 2021-08-10 — End: 2022-10-03

## 2021-08-10 MED ORDER — DIAZEPAM 10 MG PO TABS
10.0000 mg | ORAL_TABLET | Freq: Four times a day (QID) | ORAL | 0 refills | Status: DC | PRN
Start: 2021-08-10 — End: 2022-04-08

## 2021-08-10 MED ORDER — TOPIRAMATE 50 MG PO TABS: 50.0000 mg | ORAL_TABLET | Freq: Every evening | ORAL | 3 refills | Status: DC

## 2021-08-10 MED ORDER — ALLOPURINOL 300 MG PO TABS: 300.0000 mg | ORAL_TABLET | Freq: Every day | ORAL | 3 refills | Status: DC

## 2021-08-10 NOTE — Progress Notes (Addendum)
?  ?  ?  ? ? ? ? ?[facility] ? ?Patient Active Problem List  ? Diagnosis Date Noted  ? Recurrent falls 09/06/2019  ?  Priority: High  ? Status post lumbar spinal fusion 07/10/2019  ?  Priority: High  ? Osteomyelitis of lumbar spine ([place]) 06/25/2019  ?  Priority: High  ? Frequent falls 08/05/2021  ? Elevated uric acid in blood 08/05/2021  ? Vitamin deficiency 08/05/2021  ? Chronic hypoxemic respiratory failure ([place]) 08/05/2021  ? Type II diabetes mellitus with complication ([place]) 00/86/7619  ? Other chronic pain 08/05/2021  ? Vaccine counseling 08/05/2021  ? Hypoxemia 07/02/2021  ? Weight gain 02/26/2021  ? Morbid obesity ([place])   ? Short-term memory loss 02/24/2020  ? Osteopenia 01/22/2020  ? Aortic atherosclerosis ([place]) 01/22/2020  ? Wheezing 01/22/2020  ? Insomnia 10/29/2019  ? Confusion, postoperative   ? Chronic constipation   ? 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 Muscle spasm   ? Hypokalemia   ? Postoperative pain   ? Labile blood pressure   ? Hypoalbuminemia due to protein-calorie malnutrition ([place])   ? Anxiety 07/05/2019  ? Depression 07/05/2019  ? Chronic diastolic CHF (congestive heart failure) ([place]) 07/05/2019  ? Obesity (BMI 30-39.9) 07/05/2019  ? ETOH abuse 07/05/2019  ? Lumbar radiculopathy 06/28/2019  ? Spinal stenosis of lumbar region 06/28/2019  ? Tremors of nervous system   ? Chronic pain syndrome   ? Muscular deconditioning   ? Nausea 06/24/2019  ? History of sepsis 06/24/2019  ? Recent major surgery 06/24/2019  ? History of diverticulosis 06/24/2019  ? Elevated PTHrP level 05/09/2019  ? Low back pain 02/12/2019  ? Neuropathy 12/20/2018  ? Lumbar spinal stenosis 06/29/2018  ? Abnormal CT of the chest 06/04/2018  ? Hypothyroidism 11/30/2017  ? Thoracic aortic atherosclerosis ([place]) 09/14/2016  ? RLS (restless legs syndrome) 09/09/2016  ? H/O adenomatous polyp of colon 07/06/2016  ? PVC's (premature ventricular contractions) 03/29/2016  ? Grade I diastolic dysfunction  50/93/2671  ? Former cigarette smoker 01/11/2016  ? 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  ? Leg edema 01/11/2016  ? 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  ? Malignant neoplasm of upper-inner quadrant of left breast in female, estrogen receptor positive ([place]) 07/14/2015  ? CKD (chronic kidney disease), stage III 07/08/2015  ? Dependent edema 06/08/2015  ? Sleep disturbance 06/08/2015  ? Mixed hyperlipidemia 06/08/2015  ? Generalized anxiety disorder 06/08/2015  ? Essential hypertension 06/08/2015  ? COPD (chronic obstructive pulmonary disease) ([place]) 06/08/2015  ? Gout 06/08/2015  ? Macrocytic anemia   ? 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 Sepsis ([place]) 05/15/2015  ? Lumbar spondylosis 03/20/2015  ? ? ?Patient's Medications  ?New Prescriptions  ? CEFADROXIL (DURICEF) 500 MG CAPSULE    Take 1 capsule (500 mg total) by mouth 2 (two) times daily.  ?Previous Medications  ? ALLOPURINOL (ZYLOPRIM) 300 MG TABLET    Take 1 tablet (300 mg total) by mouth daily. 1 daily  ? AMITRIPTYLINE (ELAVIL) 75 MG TABLET    TAKE 1 TABLET(75 MG) BY MOUTH AT BEDTIME  ? ATORVASTATIN (LIPITOR) 80 MG TABLET    Take 1 tablet (80 mg total) by mouth daily.  ? BETAMETHASONE DIPROPIONATE 0.05 % CREAM    Apply 1 application topically daily as needed (irritated skin).  ? BIOTIN 3 MG TABS    Take 3 mg by mouth daily.  ? BLOOD GLUCOSE METER KIT AND SUPPLIES KIT    Dispense based on patient and insurance preference. Use up  to four times daily as directed.  ? BUMETANIDE (BUMEX) 1 MG TABLET    Take 1 mg by mouth daily.  ? CHOLECALCIFEROL (VITAMIN D3) 50 MCG (2000 UT) CAPSULE    Take 1 capsule (2,000 Units total) by mouth daily.  ? COLCHICINE 0.6 MG TABLET    Take 2 tablets at once then take 1 tablet an hour later then then 1 tablet twice daily x 3 days.  ? DIAZEPAM (VALIUM) 10 MG TABLET    Take 1 tablet (10 mg total) by mouth every 6 (six) hours as needed for anxiety.  ? DULAGLUTIDE (TRULICITY) 1.5 YD/7.4JO SOPN    Inject 1.5 mg into the skin once a  week.  ? DULOXETINE (CYMBALTA) 60 MG CAPSULE    Take 1 capsule (60 mg total) by mouth daily.  ? FLUTICASONE-UMECLIDIN-VILANT ([name] ELLIPTA) 100-62.5-25 MCG/ACT AEPB    Inhale 1 puff into the lungs daily.  ? LEVOTHYROXINE (SYNTHROID) 75 MCG TABLET    Take 1 tablet (75 mcg total) by mouth daily before breakfast.  ? MAGNESIUM GLUCONATE 250 MG TABS    Take 1 tablet by mouth daily.  ? METOPROLOL TARTRATE (LOPRESSOR) 25 MG TABLET    Take 1 tablet (25 mg total) by mouth 2 (two) times daily.  ? OXYGEN    Inhale 2 L into the lungs.  ? POLYETHYLENE GLYCOL POWDER (GLYCOLAX/MIRALAX) 17 GM/SCOOP POWDER    Take 17 g by mouth daily as needed for moderate constipation.  ? PRIMIDONE (MYSOLINE) 50 MG TABLET    TAKE 1 TABLET BY MOUTH TWICE DAILY  ? PROBIOTIC PRODUCT (PROBIOTIC DAILY PO)    Take 1 tablet by mouth daily.  ? PROPYLENE GLYCOL (SYSTANE BALANCE OP)    Place 1 drop into both eyes 4 (four) times daily as needed (dry eyes).  ? ROPINIROLE (REQUIP) 1 MG TABLET    TAKE 1 TABLET(1 MG) BY MOUTH AT BEDTIME  ? TOPIRAMATE (TOPAMAX) 50 MG TABLET    Take 1 tablet (50 mg total) by mouth at bedtime.  ?Modified Medications  ? No medications on file  ?Discontinued Medications  ? ALBUTEROL (VENTOLIN HFA) 108 (90 BASE) MCG/ACT INHALER    INHALE 2 PUFFS BY MOUTH EVERY 4 TO 6 HOURS AS NEEDED  ? CEPHALEXIN (KEFLEX) 500 MG CAPSULE    TAKE 1 CAPSULE(500 MG) BY MOUTH THREE TIMES DAILY  ? ? ?Subjective: ?Brandi Stephens is in for her routine follow-up visit.  She developed postop lumbar methicillin susceptible coagulase-negative staph infection in March 2021.  Old hardware was removed and she underwent revision L1-S1 fusion in April 2021 she was treated with 6 weeks of IV cefazolin before converting to chronic suppressive oral cephalexin.  She is not having any problems tolerating cephalexin.  She is not having any more problems with intermittent diarrhea.  Her magnesium supplement dose was recently decreased and now she is having problems with  constipation.  Her chronic back pain is unchanged.  She has pain every day that ranges about 5-6 out of 10.  The pain worsens dramatically if she stands for more than 10 minutes.  She is able to walk to the end of her driveway with the aid of a rolling walker.  She has not been back to see her neurosurgeon. ? ?Review of Systems: ?Review of Systems  ?Constitutional:  Negative for chills, diaphoresis and fever.  ?Gastrointestinal:  Positive for constipation. Negative for abdominal pain, diarrhea, nausea and vomiting.  ?Musculoskeletal:  Positive for back pain and joint pain.  ? ?Past  Medical History:  ?Diagnosis Date  ? AIN (anal intraepithelial neoplasia) anal canal   ? Anemia   ? with pregnancy  ? Anxiety   ? Arthritis   ? knees, lower back, knees  ? Chronic constipation   ? Chronic diastolic CHF (congestive heart failure) ([place]) 07/05/2019  ? CKD (chronic kidney disease)   ? Coarse tremors   ? Essential  ? COPD (chronic obstructive pulmonary disease) ([place])   ? 2017 chest CT, history of  ? Degenerative lumbar spinal stenosis   ? Depression   ? Depression 07/05/2019  ? Drug dependency ([place])   ? Elevated PTHrP level 05/09/2019  ? ETOH abuse 07/05/2019  ? GERD (gastroesophageal reflux disease)   ? pt denies  ? Gout   ? Grade I diastolic dysfunction 22/63/3354  ? Noted ECHO  ? Hemorrhoids   ? History of adenomatous polyp of colon   ? History of basal cell carcinoma (BCC) excision 2015  ? nose  ? History of sepsis 05/14/2014  ? post lumbar surgery  ? History of shingles   ? x2  ? Hyperlipidemia   ? Hypertension   ? Hypothyroidism   ? Irregular heart beat   ? history of while undergoing radiation  ? Malignant neoplasm of upper-inner quadrant of left breast in female, estrogen receptor positive [facility]) oncologist-  dr Jana Hakim--  per lov in epic no recurrence  ? dx 07-13-2015  Left breast invasive ductal carcinoma, Stage IA, Grade 1 (TXN0),  09-16-2015  s/p  left breast lumpectomy with sln bx's,  completed radiation 11-18-2015,   started antiestrogen therapy 12-14-2015  ? Neuropathy   ? Left foot and back of left leg  ? Obesity (BMI 30-39.9) 07/05/2019  ? Personal history of radiation therapy   ? completed 11-18-2015  left breast  ? Pneum

## 2021-08-10 NOTE — Progress Notes (Signed)
? ?Subjective:  ? ? Patient ID: Brandi Stephens, female    DOB: 10-14-1945, 76 y.o.   MRN: [id number] ? ?An audio/video tele-health visit is felt to be the most appropriate encounter for this patient at this time. This is a follow up tele-visit via phone. The patient is at home. MD is at office. Prior to scheduling this appointment, our staff discussed the limitations of evaluation and management by telemedicine and the availability of in-person appointments. The patient expressed understanding and agreed to proceed.  ? 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 Everything is the same since last visit. She is having more trouble with neuropathy in her feet and hands. The Cymbalta '60mg'$  does help somewhat. She used to get swelling with Gabapentin. The pain is bad during the day and night. Pain is improved to 4. She cannot feel her two little toes. Discussed Lyrica as an option. She would like try the Sprint PNS. Her back pain is the most severe. She has hardware. She follows with Dr. Bridget Hartshorn at the end of the month. The pain in her back feels like a dagger. The pain is more directly on the spine.  ? ?Has recently been falling again ?Neuropathy has her feet so severe that she cannot feel them ?Her Valium needs to be refilled. She asks if the dose can be increased. She does not take every day. The Valium helps with her pain as well.  ?She asks if we can go higher on the Topamax.  ? ?The Amitriptyline helps her to sleep a little better. She has improved sleep from 1 to 4 hours. She is having severe neuropathy in her left foot. The Cymbalta is helping with the neuropathic pain in her hands. She has been tolerating this medication well.  ? ?Last visit I transitioned her to outaptient therapy and she has an appointment today. She has noted improvements in her strength. She is ambulating steadily with RW today. She has bene trying using a  cane with therapy. Outpatient therapy is doing well. She has been taking 1/2 a tablet of hydrocodone to help her tolerate better (prescribed by her surgeon).  ? ?Pain is currently 5/10 and worst in her lower back where her spinal harware is present as well as in paraspinals. No longer acquiring She has been taking approximately 1 percocet per day and would like to gradually stop using these. She has also been using four '650mg'$  of Tylenol per day and two '200mg'$  ibuprofen daily.  ? ?She no longer has severe diarrhea or constipation and is much happier about this. It does come and go. She takes '1000mg'$  magensium at night. She takes colace BID. She likes to eat healthy foods.  ? ?Her daughter accompanies her this visit and helps to provide history. She and her daughter have been out of work for 5 years due to her health conditions and need for her daughter's assistance. Her grandchild recently graduated kindergarten.  ? ?1) B/l lower extremity edema ?She continues to have mild bilateral lower extremity edema.  ?Discussed use of compression garments, elevation, and ice.  ?Discussed the benefits of lymphedema therapy and she is willing to try this.  ?-She has been having pooling of fluids throughout her body ?-She has never seen nephrology but has followed with cardiology ?-Lasix administration is limited due to her creatinine.  ?-improved after Qutenza administration on Friday ? ?2) Bilateral knees: ?-she has fallen on  her knees so many times.  ?-she has been using blue emu oil. ?-she has been using voltaren gel.  ?-she has a history of arthroscopic surgery and steroid injections ?-she uses turmeric ? ?3) Muscle spasms ?-the Valium does help ? ?4) Peripheral neuropathy: ?-'60mg'$  Cymbalta is helping. ?-improved after administration of Qutenza on Friday ?-she did have some burning on ankle and shin after Qutenza and this has improved after she used the cooling gel ?-she loves the pain journal and keeps it by her ?-asks about  the topamax ?-it is really affecting her quality of life ? ?5) Prediabetes ?-has been eating very healthy foods ? ?6) Anxiety: ?-they Valium helps- she uses as needed.  ?-she appreciates her husband's and daughter's care.  ? ?5) Dizziness ?-feels related to going out, anxiety. ? ?6) obesity ?-has been trying to lose weight ? ? ? ? ?Pain Inventory ?Average Pain 4 ?Pain Right Now 2 ?My pain is constant, sharp, burning, stabbing and aching ? ?In the last 24 hours, has pain interfered with the following? ?General activity 4 ?Relation with others 0 ?Enjoyment of life 5 ?What TIME of day is your pain at its worst?  varies ?Sleep (in general) Poor ? ?Pain is worse with: walking, bending, sitting and standing ?Pain improves with: rest, heat/ice, therapy/exercise and medication ?Relief from Meds: 2 ? ? ? ? ? ? ? ?Family History  ?Problem Relation Age of Onset  ? Atrial fibrillation Mother   ? Ulcers Father   ? Breast cancer Maternal Aunt   ? Lung cancer Maternal Uncle   ? Stomach cancer Maternal Grandmother   ? Lung cancer Maternal Aunt   ? Brain cancer Maternal Aunt   ? Prostate cancer Maternal Grandfather   ? Stroke Paternal Grandmother   ? Tuberculosis Paternal Grandfather   ? Colon cancer Neg Hx   ? Esophageal cancer Neg Hx   ? Rectal cancer Neg Hx   ? ?Social History  ? ?Socioeconomic History  ? Marital status: Married  ?  Spouse name: Herbie [place]  ? Number of children: 3  ? Years of education: college  ? Highest education level: Not on file  ?Occupational History  ? Occupation: retired  ?Tobacco Use  ? Smoking status: Former  ?  Packs/day: 0.50  ?  Years: 35.00  ?  Pack years: 17.50  ?  Types: Cigarettes  ?  Quit date: 05/22/2018  ?  Years since quitting: 3.2  ? Smokeless tobacco: Never  ?Vaping Use  ? Vaping Use: Never used  ?Substance and Sexual Activity  ? Alcohol use: Yes  ?  Comment: 1 glass of wine a night  ? Drug use: Not Currently  ?  Comment: CBD tincture  ? Sexual activity: Yes  ?  Birth control/protection:  Post-menopausal  ?Other Topics Concern  ? Not on file  ?Social History Narrative  ? Lives with husband in a 2 story home.  Has 2 living children.  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  Education: college.   ? Right-handed.  ? Five cups coffee per week.  ? ?Social Determinants of Health  ? ?Financial Resource Strain: Low Risk   ? Difficulty of Paying Living Expenses: Not hard at all  ?Food Insecurity: No Food Insecurity  ? Worried About Charity fundraiser in the Last Year: Never true  ? Ran Out of Food in the Last Year: Never true  ?Transportation Needs: No Transportation Needs  ? Lack of Transportation (Medical): No  ? Lack  of Transportation (Non-Medical): No  ?Physical Activity: Inactive  ? Days of Exercise per Week: 0 days  ? Minutes of Exercise per Session: 0 min  ?Stress: Stress Concern Present  ? Feeling of Stress : To some extent  ?Social Connections: Not on file  ? ?Past Surgical History:  ?Procedure Laterality Date  ? ABDOMINAL HYSTERECTOMY  1985  ? BASAL CELL CARCINOMA EXCISION  10/15  ? nose  ? BREAST BIOPSY Right 08/24/2015  ? BREAST BIOPSY Right 07/13/2015  ? BREAST BIOPSY Left 07/13/2015  ? BREAST EXCISIONAL BIOPSY Left 1972  ? BREAST LUMPECTOMY Left   ? BREAST LUMPECTOMY WITH RADIOACTIVE SEED AND SENTINEL LYMPH NODE BIOPSY Left 09/16/2015  ? Procedure: BREAST LUMPECTOMY WITH RADIOACTIVE SEED AND SENTINEL LYMPH NODE BIOPSY;  Surgeon: Stark Klein, MD;  Location: [facility];  Service: General;  Laterality: Left;  ? BREAST REDUCTION SURGERY Bilateral 1998  ? CATARACT EXTRACTION W/ INTRAOCULAR LENS IMPLANT Left 2016  ? [place]  ? hallo  ? COLONOSCOPY  01/2013  ? polyps  ? EYE SURGERY Left 2016  ? HAND SURGERY Left 02-19-2002   dr Fredna Dow  '@[facility]'$   ? repair collateral ligament/ MPJ left thumb  ? HARDWARE REMOVAL N/A 07/24/2019  ? Procedure: REVISION OF HARDWARE Lumbar two - Sacral one. Extention of Fusion to Lumbar one;  Surgeon: [name], MD;  Location: [facility];  Service: Neurosurgery;  Laterality: N/A;  ? HEMORRHOID SURGERY  2008  ? HIGH RESOLUTION ANOSCOPY N/A 02/28/2018  ? Procedure: HIGH RESOLUTION ANOSCOPY WITH BIOPSY;  Surgeon: Leighton Ruff, MD;  Location:

## 2021-08-10 NOTE — Assessment & Plan Note (Addendum)
She has no current desire to quit taking chronic suppressive antibiotics.  She is not interested in taking much risk of having her infection flareup again.  She feels like as long as she is tolerating her antibiotics she wants to continue it.  I will check repeat inflammatory markers today.  I will change cephalexin to cefadroxil to decrease the number of daily doses.  She will follow-up in 3 months. ? ?Addendum: Her inflammatory markers have both gone up.  Given this finding and the fact that she has persistent, severe low back pain I will order a repeat lumbar MRI to determine if her fusion hardware is still intact and to look for any evidence of new complications of her remote infection.  She is in agreement with that plan. ? ? ?

## 2021-08-10 NOTE — Telephone Encounter (Signed)
Trulicity PA, needed  Brandi Stephens doubled her dose ?

## 2021-08-10 NOTE — Progress Notes (Deleted)
 Cardiology Clinic Note   Patient Name: Brandi Stephens Date of Encounter: 08/10/2021  Primary Care Provider:  Tysinger, David S, PA-C Primary Cardiologist:  Kenneth C Hilty, MD  Patient Profile    Brandi Stephens 75-year-old female presents to the clinic today for follow-up evaluation of her essential hypertension, sinus tachycardia, and chronic diastolic CHF.  Past Medical History    Past Medical History:  Diagnosis Date   AIN (anal intraepithelial neoplasia) anal canal    Anemia    with pregnancy   Anxiety    Arthritis    knees, lower back, knees   Chronic constipation    Chronic diastolic CHF (congestive heart failure) (HCC) 07/05/2019   CKD (chronic kidney disease)    Coarse tremors    Essential   COPD (chronic obstructive pulmonary disease) (HCC)    2017 chest CT, history of   Degenerative lumbar spinal stenosis    Depression    Depression 07/05/2019   Drug dependency (HCC)    Elevated PTHrP level 05/09/2019   ETOH abuse 07/05/2019   GERD (gastroesophageal reflux disease)    pt denies   Gout    Grade I diastolic dysfunction 02/01/2016   Noted ECHO   Hemorrhoids    History of adenomatous polyp of colon    History of basal cell carcinoma (BCC) excision 2015   nose   History of sepsis 05/14/2014   post lumbar surgery   History of shingles    x2   Hyperlipidemia    Hypertension    Hypothyroidism    Irregular heart beat    history of while undergoing radiation   Malignant neoplasm of upper-inner quadrant of left breast in female, estrogen receptor positive (HCC) oncologist-  dr magrinat--  per lov in epic no recurrence   dx 07-13-2015  Left breast invasive ductal carcinoma, Stage IA, Grade 1 (TXN0),  09-16-2015  s/p  left breast lumpectomy with sln bx's,  completed radiation 11-18-2015,  started antiestrogen therapy 12-14-2015   Neuropathy    Left foot and back of left leg   Obesity (BMI 30-39.9) 07/05/2019   Personal history of radiation therapy     completed 11-18-2015  left breast   Pneumonia    PONV (postoperative nausea and vomiting)    Prediabetes    diet controlled, no med   Restless leg syndrome    Seasonal allergies    Seizure (HCC) 05/2015   due to sepsis, only one time episode   Tremor    Wears partial dentures    lower    Past Surgical History:  Procedure Laterality Date   ABDOMINAL HYSTERECTOMY  1985   BASAL CELL CARCINOMA EXCISION  10/15   nose   BREAST BIOPSY Right 08/24/2015   BREAST BIOPSY Right 07/13/2015   BREAST BIOPSY Left 07/13/2015   BREAST EXCISIONAL BIOPSY Left 1972   BREAST LUMPECTOMY Left    BREAST LUMPECTOMY WITH RADIOACTIVE SEED AND SENTINEL LYMPH NODE BIOPSY Left 09/16/2015   Procedure: BREAST LUMPECTOMY WITH RADIOACTIVE SEED AND SENTINEL LYMPH NODE BIOPSY;  Surgeon: Faera Byerly, MD;  Location: [facility];  Service: General;  Laterality: Left;   BREAST REDUCTION SURGERY Bilateral 1998   CATARACT EXTRACTION W/ INTRAOCULAR LENS IMPLANT Left 2016   CERVICAL SPINE SURGERY  1995   hallo   COLONOSCOPY  01/2013   polyps   EYE SURGERY Left 2016   HAND SURGERY Left 02-19-2002   dr kuzma  @[facility]   repair collateral ligament/ MPJ left thumb     HARDWARE REMOVAL N/A 07/24/2019   Procedure: REVISION OF HARDWARE Lumbar two - Sacral one. Extention of Fusion to Lumbar one;  Surgeon: Nundkumar, Neelesh, MD;  Location: [facility];  Service: Neurosurgery;  Laterality: N/A;   HEMORRHOID SURGERY  2008   HIGH RESOLUTION ANOSCOPY N/A 02/28/2018   Procedure: HIGH RESOLUTION ANOSCOPY WITH BIOPSY;  Surgeon: Thomas, Alicia, MD;  Location: [facility];  Service: General;  Laterality: N/A;   KNEE ARTHROSCOPY Right 2002   LUMBAR SPINE SURGERY  03-20-2015   dr nundkumar   fusion L4-5, L5-S1   MOUTH SURGERY  07/14/2018   2 infected teeth removed   MULTIPLE TOOTH EXTRACTIONS     with bone grafting lower bottom right and left   REDUCTION MAMMAPLASTY Bilateral    RIGHT COLECTOMY  09-10-2003   dr streck  @[facility]   multiple colon polyps (per path tubular adenoma's, hyperplastic , benign appendix, benign two lymph nodes   ROTATOR CUFF REPAIR Left 04/2016   TONSILLECTOMY  1969   TUBAL LIGATION Bilateral 1982    Allergies  Allergies  Allergen Reactions   Ampicillin Hives and Other (See Comments)    Severe reaction in February 2017 1.5 month to have hives to go away   Gabapentin Swelling    UNSPECIFIED REACTION    Penicillins Hives and Other (See Comments)    Severe reaction in February 2017 1.5 month to have hives to go away Has patient had a PCN reaction causing immediate rash, facial/tongue/throat swelling, SOB or lightheadedness with hypotension: #  #  #  YES  #  #  #  Has patient had a PCN reaction causing severe rash involving mucus membranes or skin necrosis: No Has patient had a PCN reaction that required hospitalization No Has patient had a PCN reaction occurring within the last 10 years: No    Codeine Nausea And Vomiting   Other Itching    UNSPECIFIED Analgesics    History of Present Illness    Brandi Stephens has a PMH of shortness of breath, tachycardia, hyperlipidemia, morbid obesity, breast CA, anemia, CKD, COPD, depression, and tremor.  She was initially seen by cardiology for evaluation of palpitations and chest discomfort.  She reported episodes of palpitations that would last for a few minutes and resolved.  These episodes were not necessarily with exertion or at rest.  She underwent CT angio which showed aortic atherosclerosis.  She was seen in follow-up 03/29/2016.  She underwent nuclear stress testing which was negative for ischemia.  Her echocardiogram showed normal LV function and mild diastolic dysfunction.  Her monitor showed sinus tachycardia and occasional PVCs.  This was consistent with her intermittent episodes of palpitations.  She was seen in follow-up by Dr. Hilty on 04/22/2021.  During that time she reported worsening lower extremity swelling and shortness  of breath.  She had been put on oxygen.  She was following with pulmonology.  She was using increased diuresis.  She had been switched to Bumex from Lasix and was taking 1 mg twice daily.  She reported dizziness and low blood pressure.  She was tachycardic on exam.  She was also receiving magnesium supplementation to help with constipation.  She was scheduled for pulmonology follow-up and PFT evaluation the following week.  She presents to the clinic today for follow-up evaluation and states***  *** denies chest pain, shortness of breath, lower extremity edema, fatigue, palpitations, melena, hematuria, hemoptysis, diaphoresis, weakness, presyncope, syncope, orthopnea, and PND.  Home Medications    Prior   to Admission medications   Medication Sig Start Date End Date Taking? Authorizing Provider  albuterol (VENTOLIN HFA) 108 (90 Base) MCG/ACT inhaler INHALE 2 PUFFS BY MOUTH EVERY 4 TO 6 HOURS AS NEEDED Patient taking differently: Inhale 2 puffs into the lungs every 4 (four) hours as needed for wheezing. 08/09/19   Angiulli, Lavon Paganini, PA-C  allopurinol (ZYLOPRIM) 300 MG tablet 1 daily Patient taking differently: Take 300 mg by mouth daily. 1 daily 01/07/21   Henson, Vickie L, NP-C  amitriptyline (ELAVIL) 75 MG tablet TAKE 1 TABLET(75 MG) BY MOUTH AT BEDTIME Patient taking differently: Take 75 mg by mouth at bedtime. 09/02/20   Raulkar, Clide Deutscher, MD  atorvastatin (LIPITOR) 80 MG tablet Take 1 tablet (80 mg total) by mouth daily. 04/30/21   Hilty, Nadean Corwin, MD  baclofen (LIORESAL) 10 MG tablet Take 1 tablet (10 mg total) by mouth 2 (two) times daily. 05/05/21   Tysinger, Camelia Eng, PA-C  betamethasone dipropionate 0.05 % cream Apply 1 application topically daily as needed (irritated skin). 12/25/19   [provider]  Biotin 3 MG TABS Take 3 mg by mouth daily.    [provider]  blood glucose meter kit and supplies KIT Dispense based on patient and insurance preference. Use up to four times  daily as directed. 01/26/21   Hosie Poisson, MD  bumetanide (BUMEX) 1 MG tablet Take 1 mg by mouth daily.    [provider]  cephALEXin (KEFLEX) 500 MG capsule TAKE 1 CAPSULE(500 MG) BY MOUTH THREE TIMES DAILY 08/03/21   [name], MD  Cholecalciferol (D3 ADULT PO) Take 2,000 Units by mouth daily.    [provider]  colchicine 0.6 MG tablet Take 2 tablets at once then take 1 tablet an hour later then then 1 tablet twice daily x 3 days. 05/06/21   Tysinger, Camelia Eng, PA-C  diazepam (VALIUM) 5 MG tablet Take 1 tablet (5 mg total) by mouth daily as needed for anxiety. Patient not taking: Reported on 08/05/2021 01/26/21   Hosie Poisson, MD  diclofenac Sodium (VOLTAREN) 1 % GEL Apply 4 g topically 4 (four) times daily. 12/02/20   [name], MD  docusate sodium (COLACE) 100 MG capsule Take 1 capsule (100 mg total) by mouth 3 (three) times daily. Patient taking differently: Take 100 mg by mouth 2 (two) times daily. 07/09/19   Angiulli, Lavon Paganini, PA-C  DULoxetine (CYMBALTA) 60 MG capsule Take 1 capsule (60 mg total) by mouth daily. 08/03/21   Tysinger, Camelia Eng, PA-C  Fluticasone-Umeclidin-Vilant ([name] ELLIPTA) 100-62.5-25 MCG/ACT AEPB Inhale 1 puff into the lungs daily. 04/28/21   Laurin Coder, MD  levothyroxine (SYNTHROID) 75 MCG tablet Take 1 tablet (75 mcg total) by mouth daily before breakfast. 07/14/21   Tysinger, Camelia Eng, PA-C  Magnesium Gluconate 250 MG TABS Take 1 tablet by mouth daily.    [provider]  metoprolol tartrate (LOPRESSOR) 25 MG tablet Take 1 tablet (25 mg total) by mouth 2 (two) times daily. 07/26/21   [name], MD  polyethylene glycol powder (GLYCOLAX/MIRALAX) 17 GM/SCOOP powder Take 17 g by mouth daily as needed for moderate constipation.    [provider]  primidone (MYSOLINE) 50 MG tablet TAKE 1 TABLET BY MOUTH TWICE DAILY Patient taking differently: Take 50 mg by mouth 2 (two) times daily. 09/15/20   Raulkar, Clide Deutscher, MD   Probiotic Product (PROBIOTIC DAILY PO) Take 1 tablet by mouth daily.    [provider]  Propylene  Glycol (SYSTANE BALANCE OP) Place 1 drop into both eyes 4 (four) times daily as needed (dry eyes).    [provider]  rOPINIRole (REQUIP) 1 MG tablet TAKE 1 TABLET(1 MG) BY MOUTH AT BEDTIME 07/27/21   [name], MD  topiramate (TOPAMAX) 25 MG tablet Take 1 tablet (25 mg total) by mouth at bedtime. 03/29/21   Raulkar, Clide Deutscher, MD  TRULICITY 8.29 FA/2.1HY SOPN ADMINISTER 0.75 MG UNDER THE SKIN 1 TIME A WEEK 06/14/21   Tysinger, Camelia Eng, PA-C  vitamin B-12 (CYANOCOBALAMIN) 1000 MCG tablet Take 1 tablet (1,000 mcg total) by mouth daily. 08/09/19   Angiulli, Lavon Paganini, PA-C    Family History    Family History  Problem Relation Age of Onset   Atrial fibrillation Mother    Ulcers Father    Breast cancer Maternal Aunt    Lung cancer Maternal Uncle    Stomach cancer Maternal Grandmother    Lung cancer Maternal Aunt    Brain cancer Maternal Aunt    Prostate cancer Maternal Grandfather    Stroke Paternal Grandmother    Tuberculosis Paternal Grandfather    Colon cancer Neg Hx    Esophageal cancer Neg Hx    Rectal cancer Neg Hx    She indicated that her mother is deceased. She indicated that her father is deceased. She indicated that both of her sisters are alive. She indicated that her maternal grandmother is deceased. She indicated that her maternal grandfather is deceased. She indicated that her paternal grandmother is deceased. She indicated that her paternal grandfather is deceased. She indicated that both of her daughters are alive. She indicated that her son is deceased. She indicated that the status of her maternal uncle is unknown. She indicated that the status of her neg hx is unknown.  Social History    Social History   Socioeconomic History   Marital status: Married    Spouse name: robert   Number of children: 3   Years of education: college   Highest  education level: Not on file  Occupational History   Occupation: retired  Tobacco Use   Smoking status: Former    Packs/day: 0.50    Years: 35.00    Pack years: 17.50    Types: Cigarettes    Quit date: 05/22/2018    Years since quitting: 3.2   Smokeless tobacco: Never  Vaping Use   Vaping Use: Never used  Substance and Sexual Activity   Alcohol use: Yes    Comment: 1 glass of wine a night   Drug use: Not Currently    Comment: CBD tincture   Sexual activity: Yes    Birth control/protection: Post-menopausal  Other Topics Concern   Not on file  Social History Narrative   Lives with husband in a 2 story home.  Has 2 living children.  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  Education: college.    Right-handed.   Five cups coffee per week.   Social Determinants of Health   Financial Resource Strain: Low Risk    Difficulty of Paying Living Expenses: Not hard at all  Food Insecurity: No Food Insecurity   Worried About Charity fundraiser in the Last Year: Never true   [place] in the Last Year: Never true  Transportation Needs: No Transportation Needs   Lack of Transportation (Medical): No   Lack of Transportation (Non-Medical): No  Physical Activity: Inactive   Days of Exercise per Week: 0  days   Minutes of Exercise per Session: 0 min  Stress: Stress Concern Present   Feeling of Stress : To some extent  Social Connections: Not on file  Intimate Partner Violence: Not on file     Review of Systems    General:  No chills, fever, night sweats or weight changes.  Cardiovascular:  No chest pain, dyspnea on exertion, edema, orthopnea, palpitations, paroxysmal nocturnal dyspnea. Dermatological: No rash, lesions/masses Respiratory: No cough, dyspnea Urologic: No hematuria, dysuria Abdominal:   No nausea, vomiting, diarrhea, bright red blood per rectum, melena, or hematemesis Neurologic:  No visual changes, wkns, changes in mental status. All other systems reviewed  and are otherwise negative except as noted above.  Physical Exam    VS:  There were no vitals taken for this visit. , BMI There is no height or weight on file to calculate BMI. GEN: Well nourished, well developed, in no acute distress. HEENT: normal. Neck: Supple, no JVD, carotid bruits, or masses. Cardiac: RRR, no murmurs, rubs, or gallops. No clubbing, cyanosis, edema.  Radials/DP/PT 2+ and equal bilaterally.  Respiratory:  Respirations regular and unlabored, clear to auscultation bilaterally. GI: Soft, nontender, nondistended, BS + x 4. MS: no deformity or atrophy. Skin: warm and dry, no rash. Neuro:  Strength and sensation are intact. Psych: Normal affect.  Accessory Clinical Findings    Recent Labs: 01/18/2021: B Natriuretic Peptide 46.5 04/01/2021: Hemoglobin 12.6; Platelets 257; TSH 2.200 04/27/2021: ALT 21; BUN 30; Creatinine, Ser 1.11; Magnesium 2.4; Potassium 5.0; Sodium 139   Recent Lipid Panel    Component Value Date/Time   CHOL 196 04/27/2021 1332   TRIG 257 (H) 04/27/2021 1332   HDL 42 04/27/2021 1332   CHOLHDL 4.7 (H) 04/27/2021 1332   CHOLHDL 4.2 07/08/2016 1042   VLDL 52 (H) 07/08/2016 1042   LDLCALC 110 (H) 04/27/2021 1332    ECG personally reviewed by me today- *** - No acute changes  Echocardiogram 01/20/2021 IMPRESSIONS     1. Limited echo for LV function   2. Left ventricular ejection fraction, by estimation, is 60 to 65%. The  left ventricle has normal function. There is mild left ventricular  hypertrophy. Left ventricular diastolic parameters are consistent with  Grade I diastolic dysfunction (impaired  relaxation).   3. Aortic dilatation noted. There is mild dilatation of the ascending  aorta, measuring 41 mm.   4. The inferior vena cava is dilated in size with >50% respiratory  variability, suggesting right atrial pressure of 8 mmHg.   Comparison(s): Changes from prior study are noted. 07/07/2020: LVEF 65-70%  Nuclear stress test 01/07/2021    The study is normal. The study is low risk.   No ST deviation was noted.   LV perfusion is normal.   Left ventricular function is normal. Nuclear stress EF: 60 %. The left ventricular ejection fraction is severely decreased (<30%). End diastolic cavity size is normal.   Prior study available for comparison from 01/22/2016.   Low risk stress nuclear study with normal perfusion and normal left ventricular regional and global systolic function.  Assessment & Plan   1.  Dyspnea on exertion-continues on 2 L nasal cannula.  Continues to follow with pulmonology and completed pulmonary function test 04/28/2021.  Echocardiogram 01/20/2021 showed normal LVEF, G1 DD, and mild dilation of the ascending aorta measuring 41 mm Continue Bumex, [name] Heart healthy low-sodium diet-salty 6 given Increase physical activity as tolerated  Tachycardia-heart rate today***.  Denies episodes of palpitations.  Previously wore a   cardiac event monitor which showed sinus tachycardia and PVCs which were consistent with symptoms. Continue metoprolol Heart healthy low-sodium diet-salty 6 given Increase physical activity as tolerated Avoid triggers caffeine, chocolate, EtOH, dehydration etc.  Hypertension-BP today***.  Well-controlled at home. Continue metoprolol Heart healthy low-sodium diet-salty 6 given Increase physical activity as tolerated  Chest discomfort-no recent episodes of arm neck back or chest discomfort.  Underwent stress testing 01/07/2021 which showed low risk and no ischemia. No plans for ischemic evaluation at this time.  Hyperlipidemia-04/27/2021: Cholesterol, Total 196; HDL 42; LDL Chol Calc (NIH) 110; Triglycerides 257 Continue atorvastatin Heart healthy low-sodium high-fiber diet Increase physical activity as tolerated  Bilateral lower extremity edema-generalized bilateral lower extremity nonpitting edema. Continue Bumex Heart healthy low-sodium diet Lower extremity compression  stockings Elevate lower extremities when not active  Ascending aortic dilation-denies recent episodes of back pain.  Blood pressure well controlled.  Echocardiogram showed ascending aortic dilation of 41 mm. Repeat echocardiogram 10/23  Disposition: Follow-up with Dr. Hilty after echocardiogram in 6 months.   M.  NP-C    08/10/2021, 7:17 AM [facility] [address] Office [phone] Fax [phone]  Notice: This dictation was prepared with Dragon dictation along with smaller phrase technology. Any transcriptional errors that result from this process are unintentional and may not be corrected upon review.  I spent***minutes examining this patient, reviewing medications, and using patient centered shared decision making involving her cardiac care.  Prior to her visit I spent greater than 20 minutes reviewing her past medical history,  medications, and prior cardiac tests.  

## 2021-08-11 ENCOUNTER — Other Ambulatory Visit: Payer: Self-pay | Admitting: Internal Medicine

## 2021-08-11 ENCOUNTER — Encounter: Payer: Self-pay | Admitting: Internal Medicine

## 2021-08-11 DIAGNOSIS — M4626 Osteomyelitis of vertebra, lumbar region: Secondary | ICD-10-CM

## 2021-08-11 LAB — SEDIMENTATION RATE: Sed Rate: 48 mm/h — ABNORMAL HIGH (ref 0–30)

## 2021-08-11 LAB — C-REACTIVE PROTEIN: CRP: 36.5 mg/L — ABNORMAL HIGH (ref <8.0)

## 2021-08-12 ENCOUNTER — Telehealth: Payer: Self-pay | Admitting: Internal Medicine

## 2021-08-12 ENCOUNTER — Ambulatory Visit: Payer: PPO | Admitting: General Practice

## 2021-08-12 NOTE — Telephone Encounter (Signed)
Patient would like to switch from Dr. Debara Pickett to Dr. Radford Pax. Are you both in agreement with this? ?

## 2021-08-13 ENCOUNTER — Other Ambulatory Visit: Payer: PPO

## 2021-08-13 ENCOUNTER — Telehealth: Payer: Self-pay

## 2021-08-13 DIAGNOSIS — E782 Mixed hyperlipidemia: Secondary | ICD-10-CM

## 2021-08-13 DIAGNOSIS — E569 Vitamin deficiency, unspecified: Secondary | ICD-10-CM

## 2021-08-13 DIAGNOSIS — E118 Type 2 diabetes mellitus with unspecified complications: Secondary | ICD-10-CM | POA: Diagnosis not present

## 2021-08-13 DIAGNOSIS — I700 Atherosclerosis of aorta: Secondary | ICD-10-CM | POA: Diagnosis not present

## 2021-08-13 DIAGNOSIS — N1831 Chronic kidney disease, stage 3a: Secondary | ICD-10-CM

## 2021-08-13 DIAGNOSIS — E790 Hyperuricemia without signs of inflammatory arthritis and tophaceous disease: Secondary | ICD-10-CM | POA: Diagnosis not present

## 2021-08-13 NOTE — Telephone Encounter (Signed)
Pt states new dose of Trulicity not covered by insurance, requiring P.A.   Did P.A. and Cover my meds states covered.  Called pharmacy & went thru for $225, last Rx $50, but is covered doesn't know why cost for 1 month 4 pens now $225.  Called [employer] unable to reach by phone

## 2021-08-14 LAB — COMPREHENSIVE METABOLIC PANEL
ALT: 16 IU/L (ref 0–32)
AST: 18 IU/L (ref 0–40)
Albumin/Globulin Ratio: 2.1 (ref 1.2–2.2)
Albumin: 4.4 g/dL (ref 3.7–4.7)
Alkaline Phosphatase: 122 IU/L — ABNORMAL HIGH (ref 44–121)
BUN/Creatinine Ratio: 22 (ref 12–28)
BUN: 21 mg/dL (ref 8–27)
Bilirubin Total: 0.4 mg/dL (ref 0.0–1.2)
CO2: 26 mmol/L (ref 20–29)
Calcium: 9.7 mg/dL (ref 8.7–10.3)
Chloride: 99 mmol/L (ref 96–106)
Creatinine, Ser: 0.95 mg/dL (ref 0.57–1.00)
Globulin, Total: 2.1 g/dL (ref 1.5–4.5)
Glucose: 135 mg/dL — ABNORMAL HIGH (ref 70–99)
Potassium: 4.2 mmol/L (ref 3.5–5.2)
Sodium: 142 mmol/L (ref 134–144)
Total Protein: 6.5 g/dL (ref 6.0–8.5)
eGFR: 63 mL/min/{1.73_m2} (ref >59)

## 2021-08-14 LAB — LIPID PANEL
Chol/HDL Ratio: 3.9 ratio (ref 0.0–4.4)
Cholesterol, Total: 180 mg/dL (ref 100–199)
HDL: 46 mg/dL (ref >39)
LDL Chol Calc (NIH): 80 mg/dL (ref 0–99)
Triglycerides: 336 mg/dL — ABNORMAL HIGH (ref 0–149)
VLDL Cholesterol Cal: 54 mg/dL — ABNORMAL HIGH (ref 5–40)

## 2021-08-14 LAB — CBC
Hematocrit: 34.7 % (ref 34.0–46.6)
Hemoglobin: 11.8 g/dL (ref 11.1–15.9)
MCH: 31.7 pg (ref 26.6–33.0)
MCHC: 34 g/dL (ref 31.5–35.7)
MCV: 93 fL (ref 79–97)
Platelets: 243 10*3/uL (ref 150–450)
RBC: 3.72 x10E6/uL — ABNORMAL LOW (ref 3.77–5.28)
RDW: 13.9 % (ref 11.7–15.4)
WBC: 12.1 10*3/uL — ABNORMAL HIGH (ref 3.4–10.8)

## 2021-08-14 LAB — URIC ACID: Uric Acid: 6 mg/dL (ref 3.1–7.9)

## 2021-08-14 LAB — HEMOGLOBIN A1C
Est. average glucose Bld gHb Est-mCnc: 143 mg/dL
Hgb A1c MFr Bld: 6.6 % — ABNORMAL HIGH (ref 4.8–5.6)

## 2021-08-14 LAB — VITAMIN D 25 HYDROXY (VIT D DEFICIENCY, FRACTURES): Vit D, 25-Hydroxy: 56.9 ng/mL (ref 30.0–100.0)

## 2021-08-16 ENCOUNTER — Encounter: Payer: Self-pay | Admitting: Internal Medicine

## 2021-08-20 ENCOUNTER — Ambulatory Visit (HOSPITAL_BASED_OUTPATIENT_CLINIC_OR_DEPARTMENT_OTHER)
Admission: RE | Admit: 2021-08-20 | Discharge: 2021-08-20 | Disposition: A | Discharge status: Home / Self Care | Payer: PPO | Source: Ambulatory Visit | Attending: Internal Medicine | Admitting: Internal Medicine

## 2021-08-20 ENCOUNTER — Telehealth: Payer: Self-pay

## 2021-08-20 DIAGNOSIS — M4626 Osteomyelitis of vertebra, lumbar region: Secondary | ICD-10-CM | POA: Diagnosis not present

## 2021-08-20 DIAGNOSIS — M48061 Spinal stenosis, lumbar region without neurogenic claudication: Secondary | ICD-10-CM | POA: Diagnosis not present

## 2021-08-20 DIAGNOSIS — M4316 Spondylolisthesis, lumbar region: Secondary | ICD-10-CM | POA: Insufficient documentation

## 2021-08-20 DIAGNOSIS — S22080A Wedge compression fracture of T11-T12 vertebra, initial encounter for closed fracture: Secondary | ICD-10-CM | POA: Diagnosis not present

## 2021-08-20 DIAGNOSIS — M5136 Other intervertebral disc degeneration, lumbar region: Secondary | ICD-10-CM | POA: Insufficient documentation

## 2021-08-20 MED ORDER — GADOBUTROL 1 MMOL/ML IV SOLN
10.0000 mL | Freq: Once | INTRAVENOUS | Status: AC | PRN
  Administered 2021-08-20: 10 mL via INTRAVENOUS
  Filled 2021-08-20: qty 10

## 2021-08-20 NOTE — Telephone Encounter (Signed)
See next telephone call

## 2021-08-20 NOTE — Telephone Encounter (Signed)
Pt. Called stating you recently doubled her Trulicity and Mickel Baas is working on getting a PA done for it now. She wanted to know what you thought about maybe switching her to [facility] she heard that one works really good for losing weight.  ?

## 2021-08-23 ENCOUNTER — Encounter: Payer: Self-pay | Admitting: Internal Medicine

## 2021-08-23 NOTE — Telephone Encounter (Signed)
Called ins company and Pt is in donut hole should reach catastrophic limits soon due to MRI she just had, so called pharmacy and still shows $225,  will check back in day or 2

## 2021-08-24 ENCOUNTER — Encounter: Payer: Self-pay | Admitting: Physical Medicine and Rehabilitation

## 2021-08-24 ENCOUNTER — Encounter: Payer: PPO | Attending: Physical Medicine and Rehabilitation | Admitting: Physical Medicine and Rehabilitation

## 2021-08-24 VITALS — BP 105/70 | HR 112 | Ht 68.0 in | Wt 260.0 lb

## 2021-08-24 DIAGNOSIS — G894 Chronic pain syndrome: Secondary | ICD-10-CM | POA: Diagnosis not present

## 2021-08-24 DIAGNOSIS — G603 Idiopathic progressive neuropathy: Secondary | ICD-10-CM | POA: Insufficient documentation

## 2021-08-24 DIAGNOSIS — E1143 Type 2 diabetes mellitus with diabetic autonomic (poly)neuropathy: Secondary | ICD-10-CM | POA: Insufficient documentation

## 2021-08-24 DIAGNOSIS — F411 Generalized anxiety disorder: Secondary | ICD-10-CM | POA: Diagnosis not present

## 2021-08-24 DIAGNOSIS — B351 Tinea unguium: Secondary | ICD-10-CM | POA: Diagnosis not present

## 2021-08-24 MED ORDER — MICONAZOLE NITRATE 2 % EX CREA: 1.0000 "application " | TOPICAL_CREAM | Freq: Two times a day (BID) | CUTANEOUS | 0 refills | Status: DC

## 2021-08-24 NOTE — Telephone Encounter (Signed)
Dr. [name], would you be willing to take on this patient? ? ?Please advise as able, ?Thank you ?

## 2021-08-24 NOTE — Progress Notes (Addendum)
? ?Subjective:  ? ? Patient ID: Brandi Stephens, female    DOB: 1945/08/28, 76 y.o.   MRN: [id number] ? ? 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 Everything is the same since last visit. She is having more trouble with neuropathy in her feet and hands. The Cymbalta '60mg'$  does help somewhat. She used to get swelling with Gabapentin. The pain is bad during the day and night. Pain is improved to 4. She cannot feel her two little toes. Discussed Lyrica as an option. She would like try the Sprint PNS. Her back pain is the most severe. She has hardware. She follows with Dr. Bridget Hartshorn at the end of the month. The pain in her back feels like a dagger. The pain is more directly on the spine.  ? ?Has recently been falling again ?Neuropathy has her feet so severe that she cannot feel her lower extremities, she notes the neuropathy is worsening, but she had excellent relief from [place] for 2 weeks ?Her Valium needs to be refilled. She asks if the dose can be increased. She does not take every day. The Valium helps with her pain as well.  ?She asks if we can go higher on the Topamax.  ? ?The Amitriptyline helps her to sleep a little better. She has improved sleep from 1 to 4 hours. She is having severe neuropathy in her left foot. The Cymbalta is helping with the neuropathic pain in her hands. She has been tolerating this medication well.  ? ?Last visit I transitioned her to outaptient therapy and she has an appointment today. She has noted improvements in her strength. She is ambulating steadily with RW today. She has bene trying using a cane with therapy. Outpatient therapy is doing well. She has been taking 1/2 a tablet of hydrocodone to help her tolerate better (prescribed by her surgeon).  ? ?Pain is currently 5/10 and worst in her lower back where her spinal harware is present as well as in paraspinals. No longer acquiring She has been  taking approximately 1 percocet per day and would like to gradually stop using these. She has also been using four '650mg'$  of Tylenol per day and two '200mg'$  ibuprofen daily.  ? ?She no longer has severe diarrhea or constipation and is much happier about this. It does come and go. She takes '1000mg'$  magensium at night. She takes colace BID. She likes to eat healthy foods.  ? ?Her daughter accompanies her this visit and helps to provide history. She and her daughter have been out of work for 5 years due to her health conditions and need for her daughter's assistance. Her grandchild recently graduated kindergarten.  ? ?1) B/l lower extremity edema ?She continues to have mild bilateral lower extremity edema.  ?Discussed use of compression garments, elevation, and ice.  ?Discussed the benefits of lymphedema therapy and she is willing to try this.  ?-She has been having pooling of fluids throughout her body ?-She has never seen nephrology but has followed with cardiology ?-Lasix administration is limited due to her creatinine.  ?-improved after Qutenza administration on Friday ? ?2) Bilateral knees: ?-she has fallen on her knees so many times.  ?-she has been using blue emu oil. ?-she has been using voltaren gel.  ?-she has a history of arthroscopic surgery and steroid injections ?-she uses turmeric ? ?3) Muscle spasms ?-the Valium does help ? ?4) Peripheral neuropathy: ?-'60mg'$  Cymbalta is  helping. ?-improved after administration of Qutenza on Friday ?-she did have some burning on ankle and shin after Qutenza and this has improved after she used the cooling gel ?-she loves the pain journal and keeps it by her ?-asks about the topamax ?-it is really affecting her quality of life ? ?5) Prediabetes ?-has been eating very healthy foods ? ?6) Anxiety: ?-they Valium helps- she uses as needed.  ?-she appreciates her husband's and daughter's care.  ? ?5) Dizziness ?-feels related to going out, anxiety. ? ?6) obesity ?-has been trying  to lose weight ? ? ? ? ?Pain Inventory ?Average Pain 4 ?Pain Right Now 2 ?My pain is constant, sharp, burning, stabbing and aching ? ?In the last 24 hours, has pain interfered with the following? ?General activity 4 ?Relation with others 0 ?Enjoyment of life 5 ?What TIME of day is your pain at its worst?  varies ?Sleep (in general) Poor ? ?Pain is worse with: walking, bending, sitting and standing ?Pain improves with: rest, heat/ice, therapy/exercise and medication ?Relief from Meds: 2 ? ? ? ? ? ? ? ?Family History  ?Problem Relation Age of Onset  ? Atrial fibrillation Mother   ? Ulcers Father   ? Breast cancer Maternal Aunt   ? Lung cancer Maternal Uncle   ? Stomach cancer Maternal Grandmother   ? Lung cancer Maternal Aunt   ? Brain cancer Maternal Aunt   ? Prostate cancer Maternal Grandfather   ? Stroke Paternal Grandmother   ? Tuberculosis Paternal Grandfather   ? Colon cancer Neg Hx   ? Esophageal cancer Neg Hx   ? Rectal cancer Neg Hx   ? ?Social History  ? ?Socioeconomic History  ? Marital status: Married  ?  Spouse name: Herbie [place]  ? Number of children: 3  ? Years of education: college  ? Highest education level: Not on file  ?Occupational History  ? Occupation: retired  ?Tobacco Use  ? Smoking status: Former  ?  Packs/day: 0.50  ?  Years: 35.00  ?  Pack years: 17.50  ?  Types: Cigarettes  ?  Quit date: 05/22/2018  ?  Years since quitting: 3.2  ? Smokeless tobacco: Never  ?Vaping Use  ? Vaping Use: Never used  ?Substance and Sexual Activity  ? Alcohol use: Yes  ?  Comment: 1 glass of wine a night  ? Drug use: Not Currently  ?  Comment: CBD tincture  ? Sexual activity: Yes  ?  Birth control/protection: Post-menopausal  ?Other Topics Concern  ? Not on file  ?Social History Narrative  ? Lives with husband in a 2 story home.  Has 2 living children.  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  Education: college.   ? Right-handed.  ? Five cups coffee per week.  ? ?Social Determinants of Health  ? ?Financial  Resource Strain: Low Risk   ? Difficulty of Paying Living Expenses: Not hard at all  ?Food Insecurity: No Food Insecurity  ? Worried About Charity fundraiser in the Last Year: Never true  ? Ran Out of Food in the Last Year: Never true  ?Transportation Needs: No Transportation Needs  ? Lack of Transportation (Medical): No  ? Lack of Transportation (Non-Medical): No  ?Physical Activity: Inactive  ? Days of Exercise per Week: 0 days  ? Minutes of Exercise per Session: 0 min  ?Stress: Stress Concern Present  ? Feeling of Stress : To some extent  ?Social Connections: Not on file  ? ?  Past Surgical History:  ?Procedure Laterality Date  ? ABDOMINAL HYSTERECTOMY  1985  ? BASAL CELL CARCINOMA EXCISION  10/15  ? nose  ? BREAST BIOPSY Right 08/24/2015  ? BREAST BIOPSY Right 07/13/2015  ? BREAST BIOPSY Left 07/13/2015  ? BREAST EXCISIONAL BIOPSY Left 1972  ? BREAST LUMPECTOMY Left   ? BREAST LUMPECTOMY WITH RADIOACTIVE SEED AND SENTINEL LYMPH NODE BIOPSY Left 09/16/2015  ? Procedure: BREAST LUMPECTOMY WITH RADIOACTIVE SEED AND SENTINEL LYMPH NODE BIOPSY;  Surgeon: Stark Klein, MD;  Location: [facility];  Service: General;  Laterality: Left;  ? BREAST REDUCTION SURGERY Bilateral 1998  ? CATARACT EXTRACTION W/ INTRAOCULAR LENS IMPLANT Left 2016  ? [place]  ? hallo  ? COLONOSCOPY  01/2013  ? polyps  ? EYE SURGERY Left 2016  ? HAND SURGERY Left 02-19-2002   dr Fredna Dow  '@[facility]'$   ? repair collateral ligament/ MPJ left thumb  ? HARDWARE REMOVAL N/A 07/24/2019  ? Procedure: REVISION OF HARDWARE Lumbar two - Sacral one. Extention of Fusion to Lumbar one;  Surgeon: [name], MD;  Location: [facility];  Service: Neurosurgery;  Laterality: N/A;  ? HEMORRHOID SURGERY  2008  ? HIGH RESOLUTION ANOSCOPY N/A 02/28/2018  ? Procedure: HIGH RESOLUTION ANOSCOPY WITH BIOPSY;  Surgeon: Leighton Ruff, MD;  [facility];  Service: General;  Laterality: N/A;  ? KNEE ARTHROSCOPY Right 2002  ?  LUMBAR SPINE SURGERY  03-20-2015   dr Kathyrn Sheriff  ? fusion L4-5, L5-S1  ? MOUTH SURGERY  07/14/2018  ? 2 infected teeth removed  ? MULTIPLE TOOTH EXTRACTIONS    ? with bone grafting lower bottom right and left

## 2021-08-25 ENCOUNTER — Telehealth: Payer: Self-pay | Admitting: Pharmacist

## 2021-08-25 ENCOUNTER — Ambulatory Visit (INDEPENDENT_AMBULATORY_CARE_PROVIDER_SITE_OTHER): Payer: PPO | Admitting: Internal Medicine

## 2021-08-25 ENCOUNTER — Other Ambulatory Visit: Payer: Self-pay | Admitting: Medical

## 2021-08-25 ENCOUNTER — Other Ambulatory Visit: Payer: Self-pay

## 2021-08-25 DIAGNOSIS — M4626 Osteomyelitis of vertebra, lumbar region: Secondary | ICD-10-CM | POA: Diagnosis not present

## 2021-08-25 MED ORDER — SOLIQUA 100-33 UNT-MCG/ML ~~LOC~~ SOPN: 8.0000 [IU] | PEN_INJECTOR | Freq: Every day | SUBCUTANEOUS | 2 refills | Status: DC

## 2021-08-25 NOTE — Chronic Care Management (AMB) (Signed)
? ? ?Chronic Care Management ?Pharmacy Assistant  ? ?Name: Brandi Stephens  MRN: [id number] DOB: March 10, 1946 ? ?08/25/21 APPOINTMENT REMINDER ? ? ?Called Patient No answer, left message of appointment on 08/26/21 at 3:30 via telephone visit with Jeni Salles, Pharm D.  ? ?Notified to have all medications, supplements, blood pressure and/or blood sugar logs available during appointment and to return call if need to reschedule. ? ?  ? ?Care Gaps: ?Foot Exam - Overdue ?Eye Exam - Overdue ?Urine Micro - Overdue ?COVID Booster - Overdue ?BP- 105/70 ( 08/24/21) ?AWV- 11/22 ? ?Star Rating Drug: ?Atorvastatin 20 mg - Last filled 08/10/21 90 DS at [facility] ?Dulaglutide (Trulicity) 0.48 mg - Last filled 08/10/21 28 DS at [facility] ? ? ? ? ?Medications: ?Outpatient Encounter Medications as of 08/25/2021  ?Medication Sig  ? allopurinol (ZYLOPRIM) 300 MG tablet Take 1 tablet (300 mg total) by mouth daily. 1 daily  ? amitriptyline (ELAVIL) 75 MG tablet TAKE 1 TABLET(75 MG) BY MOUTH AT BEDTIME (Patient taking differently: Take 75 mg by mouth at bedtime.)  ? atorvastatin (LIPITOR) 80 MG tablet Take 1 tablet (80 mg total) by mouth daily.  ? betamethasone dipropionate 0.05 % cream Apply 1 application topically daily as needed (irritated skin).  ? Biotin 3 MG TABS Take 3 mg by mouth daily.  ? blood glucose meter kit and supplies KIT Dispense based on patient and insurance preference. Use up to four times daily as directed.  ? bumetanide (BUMEX) 1 MG tablet Take 1 mg by mouth daily.  ? cefadroxil (DURICEF) 500 MG capsule Take 1 capsule (500 mg total) by mouth 2 (two) times daily.  ? Cholecalciferol (VITAMIN D3) 50 MCG (2000 UT) capsule Take 1 capsule (2,000 Units total) by mouth daily.  ? colchicine 0.6 MG tablet Take 2 tablets at once then take 1 tablet an hour later then then 1 tablet twice daily x 3 days.  ? diazepam (VALIUM) 10 MG tablet Take 1 tablet (10 mg total) by mouth every 6 (six) hours as needed for anxiety.  ? Dulaglutide  (TRULICITY) 1.5 GQ/9.1QX SOPN Inject 1.5 mg into the skin once a week.  ? DULoxetine (CYMBALTA) 60 MG capsule Take 1 capsule (60 mg total) by mouth daily.  ? Fluticasone-Umeclidin-Vilant ([name] ELLIPTA) 100-62.5-25 MCG/ACT AEPB Inhale 1 puff into the lungs daily.  ? levothyroxine (SYNTHROID) 75 MCG tablet Take 1 tablet (75 mcg total) by mouth daily before breakfast.  ? Magnesium Gluconate 250 MG TABS Take 1 tablet by mouth daily.  ? metoprolol tartrate (LOPRESSOR) 25 MG tablet Take 1 tablet (25 mg total) by mouth 2 (two) times daily.  ? miconazole (MICATIN) 2 % cream Apply 1 application. topically 2 (two) times daily.  ? OXYGEN Inhale 2 L into the lungs.  ? polyethylene glycol powder (GLYCOLAX/MIRALAX) 17 GM/SCOOP powder Take 17 g by mouth daily as needed for moderate constipation.  ? primidone (MYSOLINE) 50 MG tablet TAKE 1 TABLET BY MOUTH TWICE DAILY (Patient taking differently: Take 50 mg by mouth 2 (two) times daily.)  ? Probiotic Product (PROBIOTIC DAILY PO) Take 1 tablet by mouth daily.  ? Propylene Glycol (SYSTANE BALANCE OP) Place 1 drop into both eyes 4 (four) times daily as needed (dry eyes).  ? rOPINIRole (REQUIP) 1 MG tablet TAKE 1 TABLET(1 MG) BY MOUTH AT BEDTIME  ? topiramate (TOPAMAX) 50 MG tablet Take 1 tablet (50 mg total) by mouth at bedtime.  ? ?No facility-administered encounter medications on file as of 08/25/2021.  ? ? ? ? ?  Ned Clines CMA ?Clinical Pharmacist Assistant ?[phone] ? ?

## 2021-08-25 NOTE — Telephone Encounter (Signed)
I called pharmacy & cost for Trulicity is still $228,  I called [employer] She is in coverage GAP and today they are stating pt has met $5902 and needs $1500 more in coverage gap.  Now states the MRI is medical and will not count towards the coverage gap.  Patient can not afford $228 a month for the Trulicity.  [place] we do not have samples of Trulicity.  Also [employer] is not accepting any new Patient Assistance applications so we would have to pursue another medication for Patient Assistance.  We do have Soliqua, Toujeo and a couple Ozempic.  Do you want to switch her to Soliqua or something we have samples of and then I will try to get patient approved for Patient Assistance for the new medication.    

## 2021-08-25 NOTE — Progress Notes (Signed)
Virtual Visit via Telephone Note ? ?I connected with Brandi Stephens on 08/25/21 at 10:30 AM EDT by telephone and verified that I am speaking with the correct person using two identifiers. ? ?Location: ?Patient: Home ?Provider: [facility] ?  ?I discussed the limitations, risks, security and privacy concerns of performing an evaluation and management service by telephone and the availability of in person appointments. I also discussed with the patient that there may be a patient responsible charge related to this service. The patient expressed understanding and agreed to proceed. ? ? ?History of Present Illness: ?I called and spoke with Brandi Stephens today.  I reviewed her MRI findings.  There is no evidence of worsening, active infection but I told her that the MRI could not tell us if her infection has been cured or is simply suppressed.  I reminded her that the inflammatory markers are far from perfect and can be elevated for a variety of reasons.  She feels that she is tolerating suppressive cefadroxil. ?  ?Observations/Objective: ?MRI 08/20/2021 ?IMPRESSION: ?1. Extensive postoperative changes as above. No current evidence of ?active lumbar spine infection. ?2. 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 ?3. Mild T12 compression fracture, late subacute to chronic in ?appearance. ? ?Sed Rate (mm/h)  ?Date Value  ?08/10/2021 48 (H)  ?11/03/2020 28  ?08/04/2020 28  ? ?CRP (mg/L)  ?Date Value  ?08/10/2021 36.5 (H)  ?11/03/2020 18.5 (H)  ?08/04/2020 18.5 (H)  ?02/12/2019 <1  ?  ? ?Assessment and Plan: ?She agrees that it is best for her to stay on chronic suppressive cefadroxil. ? ?Follow Up Instructions: ?Continue cefadroxil  ?follow-up in 2 to 3 months ?  ?I discussed the assessment and treatment plan with the patient. The patient was provided an opportunity to ask questions and all were answered. The patient agreed with  the plan and demonstrated an understanding of the instructions. ?  ?The patient was advised to call back or seek an in-person evaluation if the symptoms worsen or if the condition fails to improve as anticipated. ? ?I provided 16 minutes of non-face-to-face time during this encounter. ? ? ?[name], MD ? ?

## 2021-08-25 NOTE — Progress Notes (Signed)
Appt in August

## 2021-08-26 ENCOUNTER — Ambulatory Visit (INDEPENDENT_AMBULATORY_CARE_PROVIDER_SITE_OTHER): Payer: PPO | Admitting: Pharmacist

## 2021-08-26 DIAGNOSIS — E118 Type 2 diabetes mellitus with unspecified complications: Secondary | ICD-10-CM

## 2021-08-26 DIAGNOSIS — I10 Essential (primary) hypertension: Secondary | ICD-10-CM

## 2021-08-26 NOTE — Progress Notes (Signed)
? ?Chronic Care Management ?Pharmacy Note ? ?08/26/2021 ?Name:  Brandi Stephens MRN:  [id number] DOB:  Sep 02, 1945 ? ?Summary: ?A1c at goal < 7% ?Pt is unable to afford Trulicity ? ?Recommendations/Changes made from today's visit: ?-Recommended switching Trulicity to Ozempic for weight loss ?-Recommended CGM monitoring for blood sugars ?-Recommended taking metoprolol twice daily as prescribed ?-Recommended routine BP monitoring at home ? ?Plan: ?Filled out PAP for Ozempic ?Follow up after discussion with PCP ? ? ?Subjective: ?Brandi Stephens is an 76 y.o. year old female who is a primary patient of Carlena Hurl, PA-C.  The CCM team was consulted for assistance with disease management and care coordination needs.   ? ?Engaged with patient by telephone for follow up visit in response to provider referral for pharmacy case management and/or care coordination services.  ? ?Consent to Services:  ?The patient was given information about Chronic Care Management services, agreed to services, and gave verbal consent prior to initiation of services.  Please see initial visit note for detailed documentation.  ? ?Patient Care Team: ?Tysinger, Leward Quan as PCP - General (Family Medicine) ?[name], MD as PCP - Cardiology (Cardiology) ?Magrinat, Virgie Dad, MD (Inactive) as Consulting Physician (Oncology) ?Stark Klein, MD as Consulting Physician (General Surgery) ?[name], MD as Consulting Physician (Family Medicine) ?[name], MD as Consulting Physician (Neurosurgery) ?Griselda Miner, MD as Consulting Physician (Dermatology) ?Gardenia Phlegm, NP as Nurse Practitioner (Hematology and Oncology) ?Marchia Bond, MD as Consulting Physician (Orthopedic Surgery) ?Clydell Hakim, MD (Inactive) as Consulting Physician (Anesthesiology) ?Armbruster, Carlota Raspberry, MD as Consulting Physician (Gastroenterology) ?[name], MD as Consulting Physician (Neurosurgery) ?Marcial Pacas, MD as Consulting  Physician (Neurology) ?Viona Gilmore, [facility] as Pharmacist (Pharmacist) ? ?Recent office visits: ?08/05/21 Chana Bode,  PA-C: Patient presented for med check. Plan for 3 month fasting follow up. Increased allopurinol due to increased uric acid level. Increased Trulicity to 3 mg weekly. ? ?08/05/21 Chana Bode, PA-C: Patient presented for acute cough. Prescribed prednisone, clarithromycin and benzonatate.  ? ?04/16/21 [name], MD - Patient presented via Video Visit for Viral URI and other concerns. Stopped Oseltamivir and Stopped Quetiapine ?  ?02/26/21 Tysinger, Camelia Eng, PA-C - Patient presented for weight gain and other concerns. Prescribed Baclofen 10 mg, Dulaglutide 0.75 mg & Hydrocodone Acetaminophen 5-325 mg ?  ?02/26/21 [name], LPN - Patient presented for Medicare Annual Wellness Exam. No medication changes. ? ?Recent consult visits: ?08/25/21 [name], MD (Inf Disease) - Patient presented for osteomyelitis of spine follow up. Plan for chronic suppressive cefadroxil. Follow up in 2-3 months. ? ?08/24/21 Raulkar, Clide Deutscher, MD (Phys Med): Patient presented for Idiopathic progressive neuropathy and other concerns. Follow up in 3 months and given Qutenza patches. ? ?08/10/21 [name], MD (Inf Disease) - Patient presented for osteomyelitis of spine follow up. Plan for chronic suppressive cefadroxil. ? ?08/10/21 Raulkar, Clide Deutscher, MD (Phys Med): Patient presented for Idiopathic progressive neuropathy and other concerns. Follow up in 3 months and given Qutenza patches. ? ?07/27/21 Sherrilyn Rist, MD (pulmonary): Patient presented for centrilobular emphysema follow up. Follow up in 6 months. ? ?07/06/21 [name], MD (Inf Disease) - Patient presented for osteomyelitis of spine follow up. Continue cephalexin and follow up in 1 month. ? ?04/28/21 Sherrilyn Rist, MD (pulmonary): Patient presented for centrilobular emphysema follow up. Sampled [name] 1 puff daily and continued albuterol  as needed. ? ?04/22/21 [name], MD (Cardiology) - Patient presented for Shortness of Breath  and other concerns. Decreased Bumetanide to 1 mg once daily. Decreased Magnesium Gluconate to 500 mg daily ?  ?04/06/21 [name], MD (Inf Disease) - Patient presented for Influenza A. Prescribed Oseltamivir Phosphate ?  ?04/01/21 [name] (Cardiology) - Patient presented for Pain and swelling of lower extremity and other concerns. Prescribed Icosapent Ethyl. Stopped Omega -3's ?  ?03/29/21 Raulkar, Clide Deutscher, MD (Phys Med) - Patient presented for Idiopathic progressive neuropathy and other concerns. Prescribed Topiramate 25 mg  ?  ?03/26/21 Raulkar, Clide Deutscher, MD (Phys Med) - Patient presented for Idiopathic progressive neuropathy and other concerns. No medication changes. ?  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 Stopped Anastrozole 1 mg.  ?  ?02/04/21 [name], MD (Inf Disease) - Patient presented for Osteomyelitis of lumbar spine. No medication changes. ?  ?01/18/21 Girtha Rm, PA-C (Fam Med) - Patient presented for Hypoxia and other concerns. Stopped Fluorometholone 0.1% ?  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 No medication changes. ?  ?12/23/20 Abigail Butts., PA-C (Cardiology) - Patient presented for Bilateral lower extremity edema and other concerns. No medication changes. ?  ?12/17/20 Raulkar, Clide Deutscher, MD (Phys Med) - Patient presented for Anxiety and other concerns. Prescribed Ergocalciferol 50000 units.  ?  ?11/05/20 [name], MD (Vasc Surg) - Patient presented for Pain and swelling of left lower extremity. No medication changes.  ?  ?11/03/20 [name], MD (Inf Disease) - Patient presented for Acute osteomyelitis of lumbar spine. No medication changes. ?  ?Hospital  visits: ?Medication Reconciliation was completed by comparing discharge summary, patient?s EMR and Pharmacy list, and upon discussion with patient. ?  ?Patient presented to [facility] on 01/18/21 for Acute hypoxemic respiratory failure. Patient was present for 8 days.   ?  ?New?Medications Started at [facility] Discharge:?? ?-started ?blood glucose meter kit and supplies ?bumetanide (BUMEX) ?ipratropium-albuterol (DUONEB) ?oxyCODONE-acetaminophen (PERCOCET/ROXICET) ?pantoprazole (PROTONIX) ?Start taking on: January 27, 2021 ?  ?Medication Changes at Hospital Discharge: ?-Changed  ?metoprolol succinate ?  ?Medications Discontinued at Hospital Discharge: ?-Stopped  ?furosemide 40 MG ?  ?Medications that remain the same after Hospital Discharge:??  ?-All other medications will remain the same.   ? ?Objective: ? ?Lab Results  ?Component Value Date  ? CREATININE 0.95 08/13/2021  ? BUN 21 08/13/2021  ? GFRNONAA 38 (L) 01/26/2021  ? GFRAA 52 (L) 06/04/2020  ? NA 142 08/13/2021  ? K 4.2 08/13/2021  ? CALCIUM 9.7 08/13/2021  ? CO2 26 08/13/2021  ? GLUCOSE 135 (H) 08/13/2021  ? ? ?Lab Results  ?Component Value Date/Time  ? HGBA1C 6.6 (H) 08/13/2021 02:26 PM  ? HGBA1C 7.0 (H) 01/19/2021 04:03 AM  ?  ?Last diabetic Eye exam: No results found for: HMDIABEYEEXA  ?Last diabetic Foot exam: No results found for: HMDIABFOOTEX  ? ?Lab Results  ?Component Value Date  ? CHOL 180 08/13/2021  ? HDL 46 08/13/2021  ? [place] 80 08/13/2021  ? TRIG 336 (H) 08/13/2021  ? CHOLHDL 3.9 08/13/2021  ? ? ? ?  Latest Ref Rng & Units 08/13/2021  ?  2:26 PM 04/27/2021  ?  1:33 PM 04/01/2021  ? 12:42 PM  ?Hepatic Function  ?Total Protein 6.0 - 8.5 g/dL 6.5   6.3   7.2    ?Albumin 3.7 - 4.7 g/dL 4.4  4.4   4.8    ?AST 0 - 40 IU/L '18   18   23    ' ?ALT 0 - 32 IU/L '16   21   21    ' ?Alk Phosphatase 44 - 121 IU/L 122   112   111    ?Total Bilirubin 0.0 - 1.2 mg/dL 0.4   0.2   0.4    ? ? ?Lab Results  ?Component Value Date/Time  ? TSH 2.200  04/01/2021 12:42 PM  ? TSH 4.495 01/19/2021 04:03 AM  ? TSH 3.530 06/04/2020 03:11 PM  ? FREET4 1.15 06/04/2020 03:11 PM  ? FREET4 1.24 01/31/2020 10:40 AM  ? ? ? ?  Latest Ref Rng & Units 08/13/2021  ?  2:26 PM 12

## 2021-08-26 NOTE — Telephone Encounter (Signed)
Called pt and informed of [facility] instructions and recommendations to switch to [place].  Printed Pt Assistance application and have samples in [place] and printed out dosage instructions for pt to pick up whenever she picks up samples.  Will check back with pharmacy in few weeks and see if is closer to being out of coverage gap.

## 2021-08-26 NOTE — Patient Instructions (Signed)
Hi [place], ? ?It was great to catch up again! Happy belated birthday again! ? ?Please reach out to me if you have any questions or need anything before I reach back out. ? ?Best, ?Maddie ? ?Jeni Salles, PharmD, BCACP ?Clinical Pharmacist ?[place] ?[phone] ? ? Visit Information ? ? Goals Addressed   ?None ?  ? ?Patient Care Plan: [place]  ?  ? ?Problem Identified: Problem: Hypertension, Hyperlipidemia, Diabetes, Heart Failure, COPD, Hypothyroidism, Osteopenia, Gout, and Insomnia, Neuropathy   ?  ? ?Long-Range Goal: Patient-Specific Goal   ?Start Date: 04/29/2021  ?Expected End Date: 04/29/2022  ?Recent Progress: On track  ?Priority: High  ?Note:   ?Current Barriers:  ?Unable to independently monitor therapeutic efficacy ?Unable to achieve control of cholesterol  ? ?Pharmacist Clinical Goal(s):  ?Patient will achieve adherence to monitoring guidelines and medication adherence to achieve therapeutic efficacy ?achieve control of cholesterol as evidenced by next lipid panel  through collaboration with PharmD and provider.  ? ?Interventions: ?1:1 collaboration with Tysinger, Camelia Eng, PA-C regarding development and update of comprehensive plan of care as evidenced by provider attestation and co-signature ?Inter-disciplinary care team collaboration (see longitudinal plan of care) ?Comprehensive medication review performed; medication list updated in electronic medical record ? ?Hypertension (BP goal <130/80) ?-Controlled ?-Current treatment: ?Metoprolol tartrate 25 mg 1 tablet twice daily - Appropriate, Effective, Safe, Accessible ?-Medications previously tried: unknown  ?-Current home readings: 105/70 (has a cuff) - PT and nurse coming out and checking ?-Current dietary habits: limits salt intake ?-Current exercise habits: minimal due to pain ?-Denies hypotensive/hypertensive symptoms ?-Educated on BP goals and benefits of medications for prevention of heart attack, stroke and kidney  damage; ?Importance of home blood pressure monitoring; ?Proper BP monitoring technique; ?-Counseled to monitor BP at home weekly, document, and provide log at future appointments ?-Counseled on diet and exercise extensively ?Recommended to continue current medication ? ?Hyperlipidemia: (LDL goal < 70) ?-Uncontrolled ?-Current treatment: ?Atorvastatin 80 mg 1 tablet daily - Appropriate, Query effective, Safe, Accessible ?Cod liver oil 2 capsules 3 times a day (415 mg; EPA 37 mg & DHA 36 mg)  - Appropriate, Query effective, Safe, Accessible ?-Medications previously tried: Optometrist (cost)  ?-Current dietary patterns: doesn't eat fried foods - usually baked or broiled; olive oil; drinks a lot of water not doing much carbs (1 glass of wine every night) ?-Current exercise habits: minimal due to pain ?-Educated on Cholesterol goals;  ?Benefits of statin for ASCVD risk reduction; ?Importance of limiting foods high in cholesterol; ?Exercise goal of 150 minutes per week; ?-Counseled on diet and exercise extensively ?Recommended to continue current medication ?Recommended repeat lipid panel. ?Consider switching to Vascepa for triglyceride lowering.  ? ?Diabetes (A1c goal <7%) ?-Not ideally controlled ?-Current medications: ?Trulicity 1.5 mg inject once weekly - Appropriate, Query effective, Safe, Query accessible ?-Medications previously tried: none  ?-Current home glucose readings ?fasting glucose: 120-130s ?post prandial glucose: not checking ?-Denies hypoglycemic/hyperglycemic symptoms ?-Current meal patterns:  ?breakfast: did not discuss  ?lunch: did not discuss   ?dinner: did not discuss  ?snacks: did not discuss  ?drinks: did not discuss  ?-Current exercise: unable to with pain ?-Educated on A1c and blood sugar goals; ?Complications of diabetes including kidney damage, retinal damage, and cardiovascular disease; ?-Counseled to check feet daily and get yearly eye exams ?-Recommended to continue current medication ?Assessed  patient finances. Plan to apply for Ozempic PAP. ? ?Heart Failure (Goal: manage symptoms and prevent exacerbations) ?-Controlled ?-Last ejection fraction: 65-70% (Date:  3/22) ?-HF type: Diastolic ?-NYHA Class: II (slight limitation of activity) ?-AHA HF Stage: A (HF risk factors present) ?-Current treatment: ?Bumetanide 1 mg daily - Appropriate, Effective, Safe, Accessible ?-Medications previously tried: furosemide  ?-Current home BP/HR readings: refer to above ?-Current dietary habits: doesn't use any salt and uses Mrs. Dash; only eats home cooking and loves vegetables (usually fresh); roasted vegetables; seafood and some beef or steak and doesn't ?-Current exercise habits: minimal with pain; daggers in her back when walking or standing ?-Educated on Importance of weighing daily; if you gain more than 3 pounds in one day or 5 pounds in one week, call cardiology. ?Proper diuretic administration and potassium supplementation ?Importance of blood pressure control ?-Counseled on diet and exercise extensively ?Recommended to continue current medication ? ?COPD (Goal: control symptoms and prevent exacerbations) ?-Controlled ?-Current treatment  ?[name] 100-62.5-25 mcg/act 1 puff daily - Appropriate, Effective, Safe, Query accessible ?Albuterol as needed - Appropriate, Effective, Safe, Accessible ?-Medications previously tried: Advair (doesn't care for diskus)  ?-Medications previously tried: none  ?-Gold Grade: Gold 2 (FEV1 50-79%) ?-Current COPD Classification:  B (high sx, <2 exacerbations/yr) ?-MMRC/CAT score: n/a ?-Pulmonary function testing: 04/28/21 ?-Exacerbations requiring treatment in last 6 months: none ?-Patient reports consistent use of maintenance inhaler ?-Frequency of rescue inhaler use: not often (using oxygen constantly) ?-Counseled on Proper inhaler technique; ?Benefits of consistent maintenance inhaler use ?When to use rescue inhaler ?Differences between maintenance and rescue inhalers ?-Recommended to  continue current medication ? ?Depression/Anxiety/Insomnia (Goal: minimize symptoms and improve sleep) ?-Controlled ?-Current treatment: ?Duloxetine 60 mg 1 capsule daily - Appropriate, Effective, Safe, Accessible ?Diazepam 5 mg 1 tablet as needed (uses sparingly) - Appropriate, Effective, Safe, Accessible ?Amitriptyline 75 mg 1 tablet daily at bedtime - Appropriate, Effective, Safe, Accessible ?-Medications previously tried/failed: Seroquel (sleep) ?-PHQ9: 0 ?-GAD7: n/a ?-Educated on Benefits of medication for symptom control ?Benefits of cognitive-behavioral therapy with or without medication ?-Recommended to continue current medication ?Recommended for patient to think about counseling. ? ?Osteopenia (Goal prevent fractures) ?-Controlled ?-Last DEXA Scan: 11/2016  ? T-Score femoral neck: -2.0 ? T-Score total hip: n/a ? T-Score lumbar spine: 0.9 ? T-Score forearm radius: n/a ? 10-year probability of major osteoporotic fracture: n/a ? 10-year probability of hip fracture: n/a ?-Patient is not a candidate for pharmacologic treatment ?-Current treatment  ?Vitamin D 50 mcg (2000 units daily) - Appropriate, Effective, Safe, Accessible ?-Medications previously tried: none  ?-Recommend [phone] units of vitamin D daily. Recommend 1200 mg of calcium daily from dietary and supplemental sources. Recommend weight-bearing and muscle strengthening exercises for building and maintaining bone density. ?-Recommended to continue current medication ?Patient is getting adequate calcium from dietary intake. ? ?Hypothyroidism (Goal: TSH 2.5-4.5) ?-Not ideally controlled ?-Current treatment  ?Levothyroxine 75 mcg 1 tablet daily before breakfast - Appropriate, Effective, Query Safe, Accessible ?-Medications previously tried: none  ?- Consider decreasing levothyroxine dose given concurrent osteopenia. ? ?Gout (Goal: uric acid < 6 and prevent flare ups) ?-Not ideally controlled ?-Current treatment  ?Allopurinol 300 mg 1 tablet daily -  Appropriate, Query effective, Safe, Accessible ?Colchicine as needed - Appropriate, Effective, Safe, Accessible ?-Medications previously tried: none  ?-Recommended repeat uric acid level and consider inc

## 2021-08-27 ENCOUNTER — Encounter (HOSPITAL_BASED_OUTPATIENT_CLINIC_OR_DEPARTMENT_OTHER): Payer: PPO | Admitting: Physical Medicine and Rehabilitation

## 2021-08-27 ENCOUNTER — Other Ambulatory Visit: Payer: Self-pay | Admitting: Medical

## 2021-08-27 DIAGNOSIS — E118 Type 2 diabetes mellitus with unspecified complications: Secondary | ICD-10-CM

## 2021-08-27 DIAGNOSIS — E1143 Type 2 diabetes mellitus with diabetic autonomic (poly)neuropathy: Secondary | ICD-10-CM

## 2021-08-27 DIAGNOSIS — N1831 Chronic kidney disease, stage 3a: Secondary | ICD-10-CM

## 2021-08-27 MED ORDER — HYDROCODONE-ACETAMINOPHEN 5-325 MG PO TABS
1.0000 | ORAL_TABLET | ORAL | 0 refills | Status: AC | PRN
Start: 2021-08-27 — End: 2022-08-27

## 2021-08-27 MED ORDER — AMITRIPTYLINE HCL 100 MG PO TABS: 100.0000 mg | ORAL_TABLET | Freq: Every day | ORAL | 3 refills | Status: DC

## 2021-08-27 NOTE — Progress Notes (Signed)
[id number] ? ?

## 2021-08-27 NOTE — Telephone Encounter (Signed)
Per Pharmacist Ozempic dosage is 0.25 mg every week for 4 weeks and then 0.5 mg weekly,  called pt and informed, sample given and Pt Assistance application

## 2021-08-27 NOTE — Addendum Note (Signed)
Addended by: Izora Ribas on: 08/27/2021 01:38 PM ? ? Modules accepted: Orders ? ?

## 2021-08-27 NOTE — Progress Notes (Signed)
? ?Subjective:  ? ? Patient ID: Brandi Stephens, female    DOB: 1945/05/09, 76 y.o.   MRN: [id number] ? ?An audio/video tele-health visit is felt to be the most appropriate encounter for this patient at this time. This is a follow up tele-visit via phone. The patient is at home. MD is at office. Prior to scheduling this appointment, our staff discussed the limitations of evaluation and management by telemedicine and the availability of in-person appointments. The patient expressed understanding and agreed to proceed.  ? ?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 Everything is the same since last visit. She is having more trouble with neuropathy in her feet and hands. The Cymbalta '60mg'$  does help somewhat. She used to get swelling with Gabapentin. The pain is bad during the day and night. Pain is improved to 4. She cannot feel her two little toes. Discussed Lyrica as an option. She would like try the Sprint PNS. Her back pain is the most severe. She has hardware. She follows with Dr. Bridget Hartshorn at the end of the month. The pain in her back feels like a dagger. The pain is more directly on the spine.  ? ?Has recently been falling again ?Neuropathy has her feet so severe that she cannot feel her lower extremities, she notes the neuropathy is worsening, but she had excellent relief from [facility] for 2 weeks after first application. After the most recent application she has had severe burning pain around her ankles and along the 5th digit. It has kept her up at night and she was not able to enjoy her 76th birthday party. The Valium has not been enough to control this pain. She asks for something stronger. The Cymbalta helps her upper extremity neuropathy but not her lower extremity neuropathy. ?Her Valium needs to be refilled. She asks if the dose can be increased. She does not take every day. The Valium helps with her pain as well.  ?She  asks if we can go higher on the Topamax.  ? ?The Amitriptyline helps her to sleep a little better. She has improved sleep from 1 to 4 hours. She is having severe neuropathy in her left foot. The Cymbalta is helping with the neuropathic pain in her hands. She has been tolerating this medication well.  ? ?Last visit I transitioned her to outaptient therapy and she has an appointment today. She has noted improvements in her strength. She is ambulating steadily with RW today. She has bene trying using a cane with therapy. Outpatient therapy is doing well. She has been taking 1/2 a tablet of hydrocodone to help her tolerate better (prescribed by her surgeon).  ? ?Pain is currently 5/10 and worst in her lower back where her spinal harware is present as well as in paraspinals. No longer acquiring She has been taking approximately 1 percocet per day and would like to gradually stop using these. She has also been using four '650mg'$  of Tylenol per day and two '200mg'$  ibuprofen daily.  ? ?She no longer has severe diarrhea or constipation and is much happier about this. It does come and go. She takes '1000mg'$  magensium at night. She takes colace BID. She likes to eat healthy foods.  ? ?Her daughter accompanies her this visit and helps to provide history. She and her daughter have been out of work for 5 years due to her health conditions and need for her daughter's assistance. Her grandchild  recently graduated kindergarten.  ? ?1) B/l lower extremity edema ?She continues to have mild bilateral lower extremity edema.  ?Discussed use of compression garments, elevation, and ice.  ?Discussed the benefits of lymphedema therapy and she is willing to try this.  ?-She has been having pooling of fluids throughout her body ?-She has never seen nephrology but has followed with cardiology ?-Lasix administration is limited due to her creatinine.  ?-improved after Qutenza administration on Friday ? ?2) Bilateral knees: ?-she has fallen on her knees  so many times.  ?-she has been using blue emu oil. ?-she has been using voltaren gel.  ?-she has a history of arthroscopic surgery and steroid injections ?-she uses turmeric ? ?3) Muscle spasms ?-the Valium does help ? ?4) Peripheral neuropathy: ?-'60mg'$  Cymbalta is helping. ?-improved after administration of Qutenza on Friday ?-she did have some burning on ankle and shin after Qutenza and this has improved after she used the cooling gel ?-she loves the pain journal and keeps it by her ?-asks about the topamax ?-it is really affecting her quality of life ? ?5) Prediabetes ?-has been eating very healthy foods ? ?6) Anxiety: ?-they Valium helps- she uses as needed.  ?-she appreciates her husband's and daughter's care.  ? ?5) Dizziness ?-feels related to going out, anxiety. ? ?6) obesity ?-has been trying to lose weight ? ? ? ? ?Pain Inventory ?Average Pain 4 ?Pain Right Now 2 ?My pain is constant, sharp, burning, stabbing and aching ? ?In the last 24 hours, has pain interfered with the following? ?General activity 4 ?Relation with others 0 ?Enjoyment of life 5 ?What TIME of day is your pain at its worst?  varies ?Sleep (in general) Poor ? ?Pain is worse with: walking, bending, sitting and standing ?Pain improves with: rest, heat/ice, therapy/exercise and medication ?Relief from Meds: 2 ? ? ? ? ? ? ? ?Family History  ?Problem Relation Age of Onset  ? Atrial fibrillation Mother   ? Ulcers Father   ? Breast cancer Maternal Aunt   ? Lung cancer Maternal Uncle   ? Stomach cancer Maternal Grandmother   ? Lung cancer Maternal Aunt   ? Brain cancer Maternal Aunt   ? Prostate cancer Maternal Grandfather   ? Stroke Paternal Grandmother   ? Tuberculosis Paternal Grandfather   ? Colon cancer Neg Hx   ? Esophageal cancer Neg Hx   ? Rectal cancer Neg Hx   ? ?Social History  ? ?Socioeconomic History  ? Marital status: Married  ?  Spouse name: Herbie [place]  ? Number of children: 3  ? Years of education: college  ? Highest education level:  Not on file  ?Occupational History  ? Occupation: retired  ?Tobacco Use  ? Smoking status: Former  ?  Packs/day: 0.50  ?  Years: 35.00  ?  Pack years: 17.50  ?  Types: Cigarettes  ?  Quit date: 05/22/2018  ?  Years since quitting: 3.2  ? Smokeless tobacco: Never  ?Vaping Use  ? Vaping Use: Never used  ?Substance and Sexual Activity  ? Alcohol use: Yes  ?  Comment: 1 glass of wine a night  ? Drug use: Not Currently  ?  Comment: CBD tincture  ? Sexual activity: Yes  ?  Birth control/protection: Post-menopausal  ?Other Topics Concern  ? Not on file  ?Social History Narrative  ? Lives with husband in a 2 story home.  Has 2 living children.  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  Education: college.   ? Right-handed.  ? Five cups coffee per week.  ? ?Social Determinants of Health  ? ?Financial Resource Strain: Low Risk   ? Difficulty of Paying Living Expenses: Not hard at all  ?Food Insecurity: No Food Insecurity  ? Worried About Charity fundraiser in the Last Year: Never true  ? Ran Out of Food in the Last Year: Never true  ?Transportation Needs: No Transportation Needs  ? Lack of Transportation (Medical): No  ? Lack of Transportation (Non-Medical): No  ?Physical Activity: Inactive  ? Days of Exercise per Week: 0 days  ? Minutes of Exercise per Session: 0 min  ?Stress: Stress Concern Present  ? Feeling of Stress : To some extent  ?Social Connections: Not on file  ? ?Past Surgical History:  ?Procedure Laterality Date  ? ABDOMINAL HYSTERECTOMY  1985  ? BASAL CELL CARCINOMA EXCISION  10/15  ? nose  ? BREAST BIOPSY Right 08/24/2015  ? BREAST BIOPSY Right 07/13/2015  ? BREAST BIOPSY Left 07/13/2015  ? BREAST EXCISIONAL BIOPSY Left 1972  ? BREAST LUMPECTOMY Left   ? BREAST LUMPECTOMY WITH RADIOACTIVE SEED AND SENTINEL LYMPH NODE BIOPSY Left 09/16/2015  ? Procedure: BREAST LUMPECTOMY WITH RADIOACTIVE SEED AND SENTINEL LYMPH NODE BIOPSY;  Surgeon: Stark Klein, MD;  Location: [facility];  Service: General;   Laterality: Left;  ? BREAST REDUCTION SURGERY Bilateral 1998  ? CATARACT EXTRACTION W/ INTRAOCULAR LENS IMPLANT Left 2016  ? [place]  ? hallo  ? COLONOSCOPY  01/2013  ? polyps  ?

## 2021-09-01 ENCOUNTER — Other Ambulatory Visit: Payer: Self-pay | Admitting: Medical

## 2021-09-01 MED ORDER — FREESTYLE LIBRE 3 SENSOR MISC: 1.0000 | Freq: Every day | 11 refills | Status: DC

## 2021-09-03 NOTE — Telephone Encounter (Signed)
Helped pt completed Pt Assistance forms and had husband go to pharmacy to get print out of what has been spent out this year in pharmacy expenses

## 2021-09-04 DIAGNOSIS — J449 Chronic obstructive pulmonary disease, unspecified: Secondary | ICD-10-CM | POA: Diagnosis not present

## 2021-09-04 NOTE — Telephone Encounter (Signed)
Pt Assistance application Ozempic completed

## 2021-09-14 ENCOUNTER — Ambulatory Visit (INDEPENDENT_AMBULATORY_CARE_PROVIDER_SITE_OTHER): Payer: PPO

## 2021-09-14 ENCOUNTER — Ambulatory Visit: Payer: PPO | Admitting: Internal Medicine

## 2021-09-14 ENCOUNTER — Encounter: Payer: Self-pay | Admitting: Internal Medicine

## 2021-09-14 VITALS — BP 104/62 | HR 122 | Ht 68.0 in | Wt 262.0 lb

## 2021-09-14 DIAGNOSIS — R002 Palpitations: Secondary | ICD-10-CM | POA: Diagnosis not present

## 2021-09-14 NOTE — Progress Notes (Unsigned)
Enrolled for [employer] to mail a ZIO XT long term holter monitor to the patients address on file.  

## 2021-09-14 NOTE — Patient Instructions (Signed)
Medication Instructions:  No Changes In Medications at this time.  *If you need a refill on your cardiac medications before your next appointment, please call your pharmacy*  Lab Work: None Ordered At This Time.  If you have labs (blood work) drawn today and your tests are completely normal, you will receive your results only by: [place] (if you have MyChart) OR A paper copy in the mail If you have any lab test that is abnormal or we need to change your treatment, we will call you to review the results.  Testing/Procedures:  [facility]- Long Term Monitor Instructions   Your physician has requested you wear your ZIO patch monitor____7___days.   This is a single patch monitor.  [employer] supplies one patch monitor per enrollment.  Additional stickers are not available.   Please do not apply patch if you will be having a Nuclear Stress Test, Echocardiogram, Cardiac CT, MRI, or Chest Xray during the time frame you would be wearing the monitor. The patch cannot be worn during these tests.  You cannot remove and re-apply the ZIO XT patch monitor.   Your ZIO patch monitor will be sent USPS Priority mail from [facility] directly to your home address. The monitor may also be mailed to a PO BOX if home delivery is not available.   It may take 3-5 days to receive your monitor after you have been enrolled.   Once you have received you monitor, please review enclosed instructions.  Your monitor has already been registered assigning a specific monitor serial # to you.   Applying the monitor   Shave hair from upper left chest.   Hold abrader disc by orange tab.  Rub abrader in 40 strokes over left upper chest as indicated in your monitor instructions.   Clean area with 4 enclosed alcohol pads .  Use all pads to assure are is cleaned thoroughly.  Let dry.   Apply patch as indicated in monitor instructions.  Patch will be place under collarbone on left side of chest with arrow pointing  upward.   Rub patch adhesive wings for 2 minutes.Remove white label marked "1".  Remove white label marked "2".  Rub patch adhesive wings for 2 additional minutes.   While looking in a mirror, press and release button in center of patch.  A small green light will flash 3-4 times .  This will be your only indicator the monitor has been turned on.     Do not shower for the first 24 hours.  You may shower after the first 24 hours.   Press button if you feel a symptom. You will hear a small click.  Record Date, Time and Symptom in the Patient Log Book.   When you are ready to remove patch, follow instructions on last 2 pages of Patient Log Book.  Stick patch monitor onto last page of Patient Log Book.   Place Patient Log Book in [place] box.  Use locking tab on box and tape box closed securely.  The [employer] and [employer] has [employer] on it.  Please place in mailbox as soon as possible.  Your physician should have your test results approximately 7 days after the monitor has been mailed back to [facility]   Call [place] at [phone] if you have questions regarding your ZIO XT patch monitor.  Call them immediately if you see an orange light blinking on your monitor.   If your monitor falls off in less  than 4 days contact our Monitor department at [phone].  If your monitor becomes loose or falls off after 4 days call [employer] at [phone] for suggestions on securing your monitor.    Follow-Up: At [facility], you and your health needs are our priority.  As part of our continuing mission to provide you with exceptional heart care, we have created designated Provider Care Teams.  These Care Teams include your primary Cardiologist (physician) and Advanced Practice Providers (APPs -  Physician Assistants and Nurse Practitioners) who all work together to provide you with the care you need, when you need it.  Your next appointment:   6 month(s)  The format for  your next appointment:   In Person  Provider:   Janina Mayo, MD

## 2021-09-14 NOTE — Progress Notes (Signed)
Cardiology Office Note:    Date:  09/14/2021   ID:  Brandi, Stephens 05-11-1945, MRN [id number]  PCP:  Carlena Hurl, PA-C   [facility] Providers Cardiologist:  Janina Mayo, MD     Referring MD: Carlena Hurl, PA-C   No chief complaint on file. Hypoxia  History of Present Illness:    Brandi Stephens is a 76 y.o. female with a hx of 35 pack year smoker,  COPD, severe lumbar disc degeneration s/p fusion L2-S1, T12 mild compression fraction, HTN  Seen by Dr. Debara Pickett for palpitations and chest discomfort. She had a normal nuclear stress test. Echo showed normal LV function. Cardiac monitor showed sinus tachycardia and occasional PVCs. This correlated with her symptoms. She was managed with low dose metop. She was seen earlier this year with LE edema with venous stasis managed with bumex. She also had hypoxia on O2. She was hypotensive and dizzy, her bumex was decreased to 1 mg daily from BID. She has no signs of pulmonary HTN on echo, no enlarged RV. With dyspnea, recommended PFTs in the future. She saw pulmonology. She was started on [name] in the past. She's refrained from smoking. She's had PFTs in March of 2023 c/w COPD.  She has hx of left breast CA hx of lumpectomy treated with radiation. She was on hormone therapy this was 5 years ago.   Today she presents in her wheel chair and O2. She had labs 1 month ago which showed normal renal fxn. She continue on bumex 1 mg daily. She wears compression stockings. She ambulates with a walker.  No PND or orthopnea.  No pitting edema In the LE edema. 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  She has persistently elevated inflammatory markers, MRI did not show new infection. She has chronic pain  Past Medical History:  Diagnosis Date   AIN (anal intraepithelial neoplasia) anal canal    Anemia    with pregnancy   Anxiety    Arthritis    knees, lower back, knees   Chronic constipation    Chronic  diastolic CHF (congestive heart failure) ([place]) 07/05/2019   CKD (chronic kidney disease)    Coarse tremors    Essential   COPD (chronic obstructive pulmonary disease) ([place])    2017 chest CT, history of   Degenerative lumbar spinal stenosis    Depression    Depression 07/05/2019   Drug dependency ([place])    Elevated PTHrP level 05/09/2019   ETOH abuse 07/05/2019   GERD (gastroesophageal reflux disease)    pt denies   Gout    Grade I diastolic dysfunction 35/32/9924   Noted ECHO   Hemorrhoids    History of adenomatous polyp of colon    History of basal cell carcinoma (BCC) excision 2015   nose   History of sepsis 05/14/2014   post lumbar surgery   History of shingles    x2   Hyperlipidemia    Hypertension    Hypothyroidism    Irregular heart beat    history of while undergoing radiation   Malignant neoplasm of upper-inner quadrant of left breast in female, estrogen receptor positive [facility]) oncologist-  dr Jana Hakim--  per lov in epic no recurrence   dx 07-13-2015  Left breast invasive ductal carcinoma, Stage IA, Grade 1 (TXN0),  09-16-2015  s/p  left breast lumpectomy with sln bx's,  completed radiation 11-18-2015,  started antiestrogen therapy 12-14-2015   Neuropathy  Left foot and back of left leg   Obesity (BMI 30-39.9) 07/05/2019   Personal history of radiation therapy    completed 11-18-2015  left breast   Pneumonia    PONV (postoperative nausea and vomiting)    Prediabetes    diet controlled, no med   Restless leg syndrome    Seasonal allergies    Seizure ([place]) 05/2015   due to sepsis, only one time episode   Tremor    Wears partial dentures    lower     Past Surgical History:  Procedure Laterality Date   ABDOMINAL HYSTERECTOMY  1985   BASAL CELL CARCINOMA EXCISION  10/15   nose   BREAST BIOPSY Right 08/24/2015   BREAST BIOPSY Right 07/13/2015   BREAST BIOPSY Left 07/13/2015   BREAST EXCISIONAL BIOPSY Left 1972   BREAST LUMPECTOMY Left    BREAST LUMPECTOMY  WITH RADIOACTIVE SEED AND SENTINEL LYMPH NODE BIOPSY Left 09/16/2015   Procedure: BREAST LUMPECTOMY WITH RADIOACTIVE SEED AND SENTINEL LYMPH NODE BIOPSY;  Surgeon: Stark Klein, MD;  Location: [place];  Service: General;  Laterality: Left;   BREAST REDUCTION SURGERY Bilateral 1998   CATARACT EXTRACTION W/ INTRAOCULAR LENS IMPLANT Left 2016   CERVICAL SPINE SURGERY  1995   hallo   COLONOSCOPY  01/2013   polyps   EYE SURGERY Left 2016   HAND SURGERY Left 02-19-2002   dr Fredna Dow  '@[facility]'    repair collateral ligament/ MPJ left thumb   HARDWARE REMOVAL N/A 07/24/2019   Procedure: REVISION OF HARDWARE Lumbar two - Sacral one. Extention of Fusion to Lumbar one;  Surgeon: [name], MD;  Location: [place];  Service: Neurosurgery;  Laterality: N/A;   HEMORRHOID SURGERY  2008   HIGH RESOLUTION ANOSCOPY N/A 02/28/2018   Procedure: HIGH RESOLUTION ANOSCOPY WITH BIOPSY;  Surgeon: Leighton Ruff, MD;  Location: [place];  Service: General;  Laterality: N/A;   KNEE ARTHROSCOPY Right 2002   LUMBAR SPINE SURGERY  03-20-2015   dr Kathyrn Sheriff   fusion L4-5, L5-S1   MOUTH SURGERY  07/14/2018   2 infected teeth removed   MULTIPLE TOOTH EXTRACTIONS     with bone grafting lower bottom right and left   REDUCTION MAMMAPLASTY Bilateral    RIGHT COLECTOMY  09-10-2003   dr Margot Chimes '@[facility]'    multiple colon polyps (per path tubular adenoma's, hyperplastic , benign appendix, benign two lymph nodes   ROTATOR CUFF REPAIR Left 04/2016   TONSILLECTOMY  1969   TUBAL LIGATION Bilateral 1982    Current Medications: Current Meds  Medication Sig   allopurinol (ZYLOPRIM) 300 MG tablet Take 1 tablet (300 mg total) by mouth daily. 1 daily   amitriptyline (ELAVIL) 100 MG tablet Take 1 tablet (100 mg total) by mouth at bedtime.   atorvastatin (LIPITOR) 80 MG tablet Take 1 tablet (80 mg total) by mouth daily.   betamethasone dipropionate 0.05 % cream Apply 1 application topically daily as needed  (irritated skin).   Biotin 3 MG TABS Take 3 mg by mouth daily.   blood glucose meter kit and supplies KIT Dispense based on patient and insurance preference. Use up to four times daily as directed.   bumetanide (BUMEX) 1 MG tablet Take 1 mg by mouth daily.   cefadroxil (DURICEF) 500 MG capsule Take 1 capsule (500 mg total) by mouth 2 (two) times daily.   Cholecalciferol (VITAMIN D3) 50 MCG (2000 UT) capsule Take 1 capsule (2,000 Units total) by mouth daily.   colchicine  0.6 MG tablet Take 2 tablets at once then take 1 tablet an hour later then then 1 tablet twice daily x 3 days.   Continuous Blood Gluc Sensor (FREESTYLE LIBRE 3 SENSOR) MISC 1 each by Does not apply route daily. Place 1 sensor on the skin every 14 days. Use to check glucose continuously   diazepam (VALIUM) 10 MG tablet Take 1 tablet (10 mg total) by mouth every 6 (six) hours as needed for anxiety.   DULoxetine (CYMBALTA) 60 MG capsule Take 1 capsule (60 mg total) by mouth daily.   Fluticasone-Umeclidin-Vilant ([name] ELLIPTA) 100-62.5-25 MCG/ACT AEPB Inhale 1 puff into the lungs daily.   HYDROcodone-acetaminophen (NORCO/VICODIN) 5-325 MG tablet Take 1 tablet by mouth every 4 (four) hours as needed for moderate pain.   Insulin Glargine-Lixisenatide (SOLIQUA) 100-33 UNT-MCG/ML SOPN Inject 8 Units into the skin daily.   levothyroxine (SYNTHROID) 75 MCG tablet Take 1 tablet (75 mcg total) by mouth daily before breakfast.   Magnesium Gluconate 250 MG TABS Take 1 tablet by mouth daily.   metoprolol tartrate (LOPRESSOR) 25 MG tablet Take 1 tablet (25 mg total) by mouth 2 (two) times daily.   miconazole (MICATIN) 2 % cream Apply 1 application. topically 2 (two) times daily.   OXYGEN Inhale 2 L into the lungs.   polyethylene glycol powder (GLYCOLAX/MIRALAX) 17 GM/SCOOP powder Take 17 g by mouth daily as needed for moderate constipation.   primidone (MYSOLINE) 50 MG tablet TAKE 1 TABLET BY MOUTH TWICE DAILY (Patient taking differently: Take  50 mg by mouth 2 (two) times daily.)   Probiotic Product (PROBIOTIC DAILY PO) Take 1 tablet by mouth daily.   Propylene Glycol (SYSTANE BALANCE OP) Place 1 drop into both eyes 4 (four) times daily as needed (dry eyes).   rOPINIRole (REQUIP) 1 MG tablet TAKE 1 TABLET(1 MG) BY MOUTH AT BEDTIME   topiramate (TOPAMAX) 50 MG tablet Take 1 tablet (50 mg total) by mouth at bedtime.     Allergies:   Ampicillin, Gabapentin, Penicillins, Codeine, and Other   Social History   Socioeconomic History   Marital status: Married    Spouse name: robert   Number of children: 3   Years of education: college   Highest education level: Not on file  Occupational History   Occupation: retired  Tobacco Use   Smoking status: Former    Packs/day: 0.50    Years: 35.00    Pack years: 17.50    Types: Cigarettes    Quit date: 05/22/2018    Years since quitting: 3.3   Smokeless tobacco: Never  Vaping Use   Vaping Use: Never used  Substance and Sexual Activity   Alcohol use: Yes    Comment: 1 glass of wine a night   Drug use: Not Currently    Comment: CBD tincture   Sexual activity: Yes    Birth control/protection: Post-menopausal  Other Topics Concern   Not on file  Social History Narrative   Lives with husband in a 2 story home.  Has 2 living children.  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  Education: college.    Right-handed.   Five cups coffee per week.   Social Determinants of Health   Financial Resource Strain: Low Risk    Difficulty of Paying Living Expenses: Not hard at all  Food Insecurity: No Food Insecurity   Worried About Charity fundraiser in the Last Year: Never true   [place] in the Last Year: Never  true  Transportation Needs: No Transportation Needs   Lack of Transportation (Medical): No   Lack of Transportation (Non-Medical): No  Physical Activity: Inactive   Days of Exercise per Week: 0 days   Minutes of Exercise per Session: 0 min  Stress: Stress Concern  Present   Feeling of Stress : To some extent  Social Connections: Not on file     Family History: The patient's family history includes Atrial fibrillation in her mother; Brain cancer in her maternal aunt; Breast cancer in her maternal aunt; Lung cancer in her maternal aunt and maternal uncle; Prostate cancer in her maternal grandfather; Stomach cancer in her maternal grandmother; Stroke in her paternal grandmother; Tuberculosis in her paternal grandfather; Ulcers in her father. There is no history of Colon cancer, Esophageal cancer, or Rectal cancer.  ROS:   Please see the history of present illness.     All other systems reviewed and are negative.  EKGs/Labs/Other Studies Reviewed:    The following studies were reviewed today:   EKG:  EKG is  ordered today.  The ekg ordered today demonstrates   Sinus tachycardia HR 122 bpm  Recent Labs: 01/18/2021: B Natriuretic Peptide 46.5 04/01/2021: TSH 2.200 04/27/2021: Magnesium 2.4 08/13/2021: ALT 16; BUN 21; Creatinine, Ser 0.95; Hemoglobin 11.8; Platelets 243; Potassium 4.2; Sodium 142  Recent Lipid Panel    Component Value Date/Time   CHOL 180 08/13/2021 1426   TRIG 336 (H) 08/13/2021 1426   HDL 46 08/13/2021 1426   CHOLHDL 3.9 08/13/2021 1426   CHOLHDL 4.2 07/08/2016 1042   VLDL 52 (H) 07/08/2016 1042   LDLCALC 80 08/13/2021 1426     Risk Assessment/Calculations:           Physical Exam:    VS:   Vitals:   09/14/21 1608  BP: 104/62  Pulse: (!) 122  SpO2: 98%     Wt Readings from Last 3 Encounters:  09/14/21 262 lb (118.8 kg)  08/24/21 260 lb (117.9 kg)  08/10/21 267 lb (121.1 kg)     GEN:  Well nourished, well developed in no acute distress HEENT: Normal NECK: No JVD; No carotid bruits LYMPHATICS: No lymphadenopathy CARDIAC: RRR, no murmurs, rubs, gallops RESPIRATORY: nasal cannula in place,  no rales ABDOMEN: Soft, non-tender, non-distended MUSCULOSKELETAL:  No pitting edema; No deformity  SKIN: Warm  and dry NEUROLOGIC:  Alert and oriented x 3 PSYCHIATRIC:  Normal affect   ASSESSMENT:    Sinus tachycardia: Ddx includes deconditioning, chronic pain, No active thyroid disease. Will get a zio to ensure rapid heart rates at night are not afib  Diastolic CHF: E/e/ 15. Limited with increasing her bumex. Prior BID dosing contributed to hypotension/dehydration. Will continue with this dose. Recommended continuing compression stockings. No pitting today. No symptoms of decompensated CHF. Crt is normal. If BP permits in the future, would add spironolactone.   PLAN:    In order of problems listed above:  7 day zio Follow up in 6 months        Medication Adjustments/Labs and Tests Ordered: Current medicines are reviewed at length with the patient today.  Concerns regarding medicines are outlined above.  Orders Placed This Encounter  Procedures   LONG TERM MONITOR (3-14 DAYS)   EKG 12-Lead   No orders of the defined types were placed in this encounter.   Patient Instructions  Medication Instructions:  No Changes In Medications at this time.  *If you need a refill on your cardiac medications before your next appointment,  please call your pharmacy*  Lab Work: None Ordered At This Time.  If you have labs (blood work) drawn today and your tests are completely normal, you will receive your results only by: [place] (if you have MyChart) OR A paper copy in the mail If you have any lab test that is abnormal or we need to change your treatment, we will call you to review the results.  Testing/Procedures:  [place]- Long Term Monitor Instructions   Your physician has requested you wear your ZIO patch monitor____7___days.   This is a single patch monitor.  [employer] supplies one patch monitor per enrollment.  Additional stickers are not available.   Please do not apply patch if you will be having a Nuclear Stress Test, Echocardiogram, Cardiac CT, MRI, or Chest Xray during the time  frame you would be wearing the monitor. The patch cannot be worn during these tests.  You cannot remove and re-apply the ZIO XT patch monitor.   Your ZIO patch monitor will be sent USPS Priority mail from [facility] directly to your home address. The monitor may also be mailed to a PO BOX if home delivery is not available.   It may take 3-5 days to receive your monitor after you have been enrolled.   Once you have received you monitor, please review enclosed instructions.  Your monitor has already been registered assigning a specific monitor serial # to you.   Applying the monitor   Shave hair from upper left chest.   Hold abrader disc by orange tab.  Rub abrader in 40 strokes over left upper chest as indicated in your monitor instructions.   Clean area with 4 enclosed alcohol pads .  Use all pads to assure are is cleaned thoroughly.  Let dry.   Apply patch as indicated in monitor instructions.  Patch will be place under collarbone on left side of chest with arrow pointing upward.   Rub patch adhesive wings for 2 minutes.Remove white label marked "1".  Remove white label marked "2".  Rub patch adhesive wings for 2 additional minutes.   While looking in a mirror, press and release button in center of patch.  A small green light will flash 3-4 times .  This will be your only indicator the monitor has been turned on.     Do not shower for the first 24 hours.  You may shower after the first 24 hours.   Press button if you feel a symptom. You will hear a small click.  Record Date, Time and Symptom in the Patient Log Book.   When you are ready to remove patch, follow instructions on last 2 pages of Patient Log Book.  Stick patch monitor onto last page of Patient Log Book.   Place Patient Log Book in [place] box.  Use locking tab on box and tape box closed securely.  The [employer] and [employer] has [employer] on it.  Please place in mailbox as soon as possible.  Your physician should have  your test results approximately 7 days after the monitor has been mailed back to [facility].   Call [place] at [phone] if you have questions regarding your ZIO XT patch monitor.  Call them immediately if you see an orange light blinking on your monitor.   If your monitor falls off in less than 4 days contact our Monitor department at [phone].  If your monitor becomes loose or falls off after 4 days call [employer]  at [phone] for suggestions on securing your monitor.    Follow-Up: At [facility], you and your health needs are our priority.  As part of our continuing mission to provide you with exceptional heart care, we have created designated Provider Care Teams.  These Care Teams include your primary Cardiologist (physician) and Advanced Practice Providers (APPs -  Physician Assistants and Nurse Practitioners) who all work together to provide you with the care you need, when you need it.  Your next appointment:   6 month(s)  The format for your next appointment:   In Person  Provider:   Janina Mayo, MD           Signed, Janina Mayo, MD  09/14/2021 6:07 PM    [place]

## 2021-09-15 DIAGNOSIS — E785 Hyperlipidemia, unspecified: Secondary | ICD-10-CM | POA: Diagnosis not present

## 2021-09-15 DIAGNOSIS — F418 Other specified anxiety disorders: Secondary | ICD-10-CM | POA: Diagnosis not present

## 2021-09-15 DIAGNOSIS — J449 Chronic obstructive pulmonary disease, unspecified: Secondary | ICD-10-CM

## 2021-09-15 DIAGNOSIS — I10 Essential (primary) hypertension: Secondary | ICD-10-CM

## 2021-09-15 DIAGNOSIS — Z794 Long term (current) use of insulin: Secondary | ICD-10-CM

## 2021-09-15 DIAGNOSIS — E039 Hypothyroidism, unspecified: Secondary | ICD-10-CM | POA: Diagnosis not present

## 2021-09-15 DIAGNOSIS — I110 Hypertensive heart disease with heart failure: Secondary | ICD-10-CM

## 2021-09-15 DIAGNOSIS — Z87891 Personal history of nicotine dependence: Secondary | ICD-10-CM | POA: Diagnosis not present

## 2021-09-15 DIAGNOSIS — I509 Heart failure, unspecified: Secondary | ICD-10-CM | POA: Diagnosis not present

## 2021-09-15 DIAGNOSIS — E118 Type 2 diabetes mellitus with unspecified complications: Secondary | ICD-10-CM

## 2021-09-15 DIAGNOSIS — E1142 Type 2 diabetes mellitus with diabetic polyneuropathy: Secondary | ICD-10-CM

## 2021-09-24 ENCOUNTER — Telehealth: Payer: Self-pay

## 2021-09-24 ENCOUNTER — Telehealth: Payer: Self-pay | Admitting: *Deleted

## 2021-09-24 ENCOUNTER — Telehealth: Payer: Self-pay | Admitting: Internal Medicine

## 2021-09-24 NOTE — Telephone Encounter (Signed)
This RN spoke with pt per call stating concern due to noted left breast changes over the past several months.  She states the left breast had lumpectomy.  She states it her right breast is a DD size and her left breast was more of D - but now the breast " has shrunk and is more like a C cup "  She states no discomfort or noted area of skin not looking normal.  She is update to date on mammograms and is due next month for her yearly.  Pt was last seen in this office in December 2022 and "graduated".  Per discussion of above this RN offered if the patient would like to be seen in our survivor ship program-  With pt stating agreement " I would like that- my primary MD doesn't always know how to best answer my breast cancer questions .  Appt will be made per above.

## 2021-09-24 NOTE — Telephone Encounter (Signed)
error 

## 2021-09-24 NOTE — Telephone Encounter (Signed)
Pt states that she received her Heart Monitor in the mail and would like to know when she needs to apply it. Please advise

## 2021-09-24 NOTE — Telephone Encounter (Signed)
Called [facility] to recheck on Trulicity cost is still $228 at this time

## 2021-09-24 NOTE — Telephone Encounter (Addendum)
Called pt to go over the monitor. She states it was delivered to her neighbor and just got it. "I did not know if it was programmed with a certain date or not." Pt advised she can apply the monitor today and remove it in 7 days and send back in the same box it was delivered in. She verbalized understanding. No further questions expressed at this time.

## 2021-09-25 DIAGNOSIS — R002 Palpitations: Secondary | ICD-10-CM | POA: Diagnosis not present

## 2021-10-03 IMAGING — RF DG C-ARM 1-60 MIN
1 series · 2 of 2 positions shown · non-contrast
Comparison: MRI lumbar spine 03/31/2019.

CLINICAL DATA: L2-3 PLIF. Prior L3 through S1 fusion.

EXAM:
OPERATIVE LUMBAR SPINE 2 VIEW(S)

[Series 1: unknown protocol · 0.14mm/px · 2 of 2 slices shown]
[im 1/2]
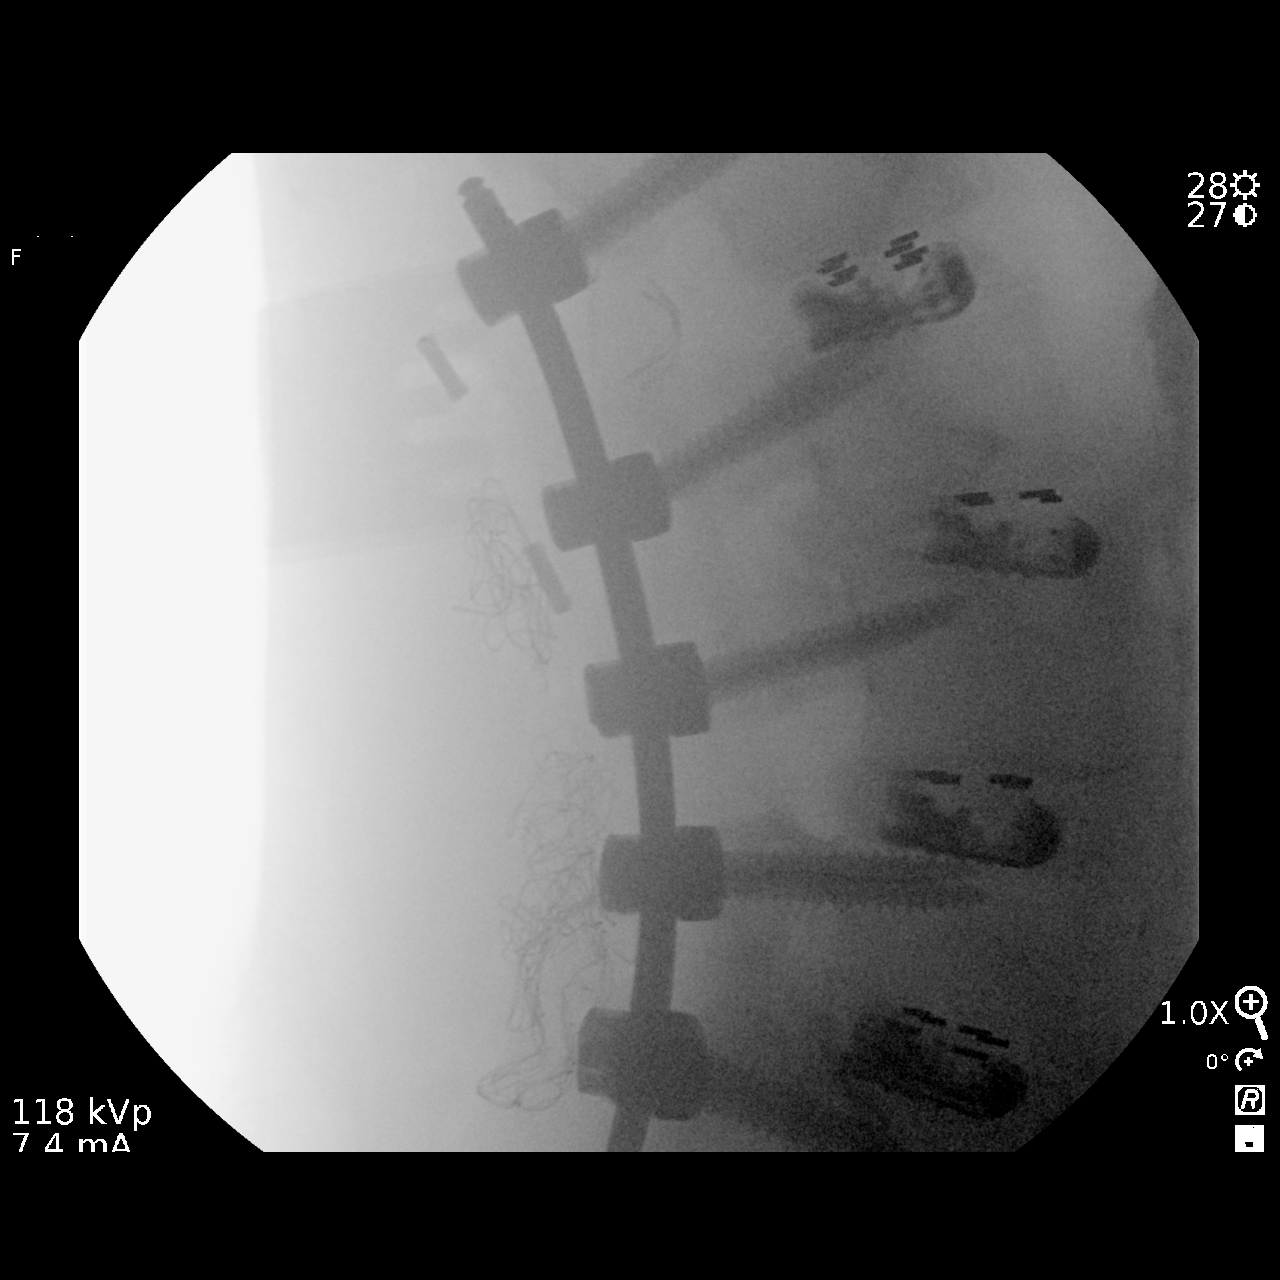
[im 2/2]
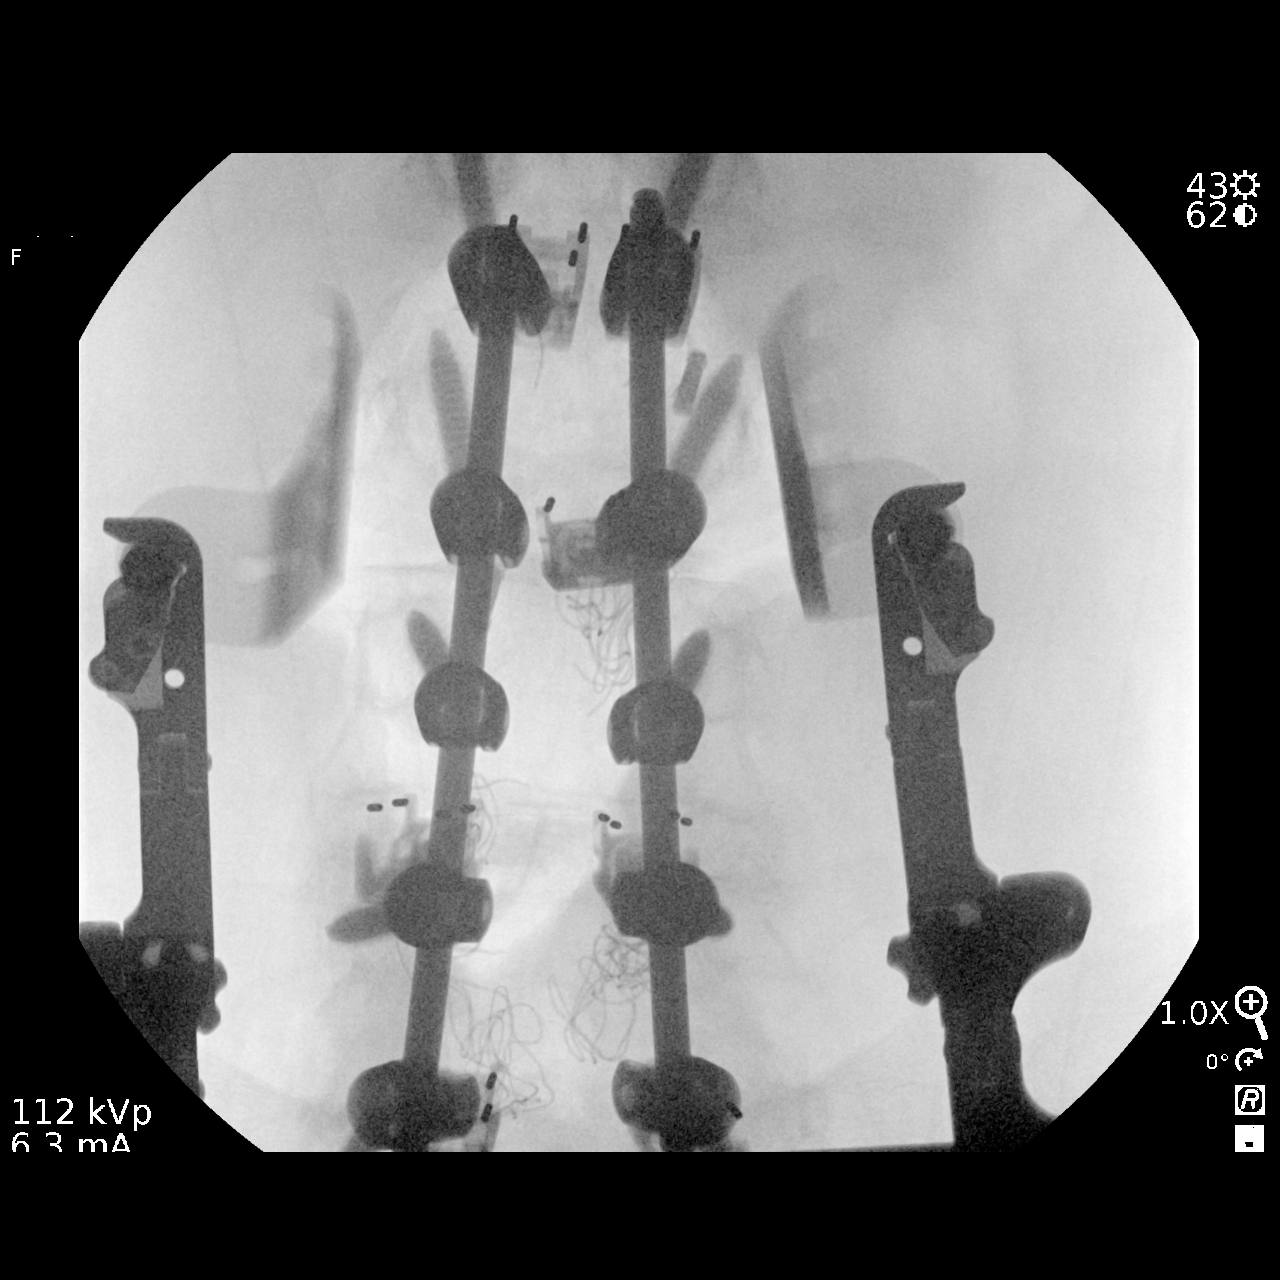

[2 of 2 positions shown; findings below may reference images not displayed]

FINDINGS: Two spot images from the C-arm fluoroscopic device, AP and LATERAL
views of the lumbar spine are submitted for interpretation
post-operatively. The patient has undergone additional L2-3 fusion
and now has hardware spanning L2 through S1 and disc prostheses at
every level from L2-3 through L5-S1.

The radiologic technologist documented 20 seconds of fluoroscopy
time.
IMPRESSION: L2-3 fusion, with hardware now spanning L2 through S1 and disc
prostheses at every level from L2-3 through L5-S1.

## 2021-10-04 ENCOUNTER — Ambulatory Visit: Payer: PPO | Admitting: Physical Medicine and Rehabilitation

## 2021-10-05 DIAGNOSIS — J449 Chronic obstructive pulmonary disease, unspecified: Secondary | ICD-10-CM | POA: Diagnosis not present

## 2021-10-07 ENCOUNTER — Telehealth: Payer: Self-pay | Admitting: Pharmacist

## 2021-10-07 NOTE — Telephone Encounter (Signed)
Patient called as she has not heard back yet about the Ozempic patient assistance application and she just administered her last dose of the sample pen.   Called [employer] about application status. Application is missing required information on enrollment form. Per [employer] rep the application is missing: date next to patient's signature on page 3. Provided date verbally.  Patient has been approved for Ozempic patient assistance and it will be delivered in  10-14 business days.   Notified patient of the update. Set aside a sample of Ozempic for patient to get prior to receiving her patient assistance supply in office.   Scheduled in office visit for [facility] training for next Thursday.

## 2021-10-08 ENCOUNTER — Telehealth: Payer: Self-pay | Admitting: Physical Medicine and Rehabilitation

## 2021-10-08 NOTE — Telephone Encounter (Signed)
Patient called regarding RX for Toenail fungus that needs to be called in.

## 2021-10-09 ENCOUNTER — Other Ambulatory Visit: Payer: Self-pay | Admitting: Physical Medicine and Rehabilitation

## 2021-10-09 MED ORDER — MICONAZOLE NITRATE 2 % EX CREA: 1.0000 | TOPICAL_CREAM | Freq: Two times a day (BID) | CUTANEOUS | 12 refills | Status: DC

## 2021-10-12 ENCOUNTER — Ambulatory Visit: Payer: PPO | Admitting: Physical Medicine and Rehabilitation

## 2021-10-12 DIAGNOSIS — R002 Palpitations: Secondary | ICD-10-CM | POA: Diagnosis not present

## 2021-10-13 ENCOUNTER — Telehealth: Payer: Self-pay | Admitting: Pharmacist

## 2021-10-13 NOTE — Chronic Care Management (AMB) (Signed)
Chronic Care Management Pharmacy Assistant   Name: Brandi Stephens  MRN: [id number] DOB: December 07, 1945  10/13/21 APPOINTMENT REMINDER   Called Patient No answer, left message of appointment on 6/29 at 2 via office visit with Jeni Salles, Pharm D.   Notified to bring diabetic test supplies, all medications, supplements, blood pressure and/or blood sugar logs available during appointment and to return call if need to reschedule.     Care Gaps: Foot Exam - Overdue Eye Exam - Overdue Urine Micro - Overdue COVID Booster - Overdue AWV- 10/14/21 BP- 104/62 Lab Results  Component Value Date   HGBA1C 6.6 (H) 08/13/2021    Star Rating Drug: Atorvastatin 20 mg - Last filled 08/10/21 90 DS at [facility] Dulaglutide (Trulicity) 9.56 mg - Last filled 08/10/21 28 DS at [facility]    Medications: Outpatient Encounter Medications as of 10/13/2021  Medication Sig   allopurinol (ZYLOPRIM) 300 MG tablet Take 1 tablet (300 mg total) by mouth daily. 1 daily   amitriptyline (ELAVIL) 100 MG tablet Take 1 tablet (100 mg total) by mouth at bedtime.   atorvastatin (LIPITOR) 80 MG tablet Take 1 tablet (80 mg total) by mouth daily.   betamethasone dipropionate 0.05 % cream Apply 1 application topically daily as needed (irritated skin).   Biotin 3 MG TABS Take 3 mg by mouth daily.   blood glucose meter kit and supplies KIT Dispense based on patient and insurance preference. Use up to four times daily as directed.   bumetanide (BUMEX) 1 MG tablet Take 1 mg by mouth daily.   cefadroxil (DURICEF) 500 MG capsule Take 1 capsule (500 mg total) by mouth 2 (two) times daily.   Cholecalciferol (VITAMIN D3) 50 MCG (2000 UT) capsule Take 1 capsule (2,000 Units total) by mouth daily.   colchicine 0.6 MG tablet Take 2 tablets at once then take 1 tablet an hour later then then 1 tablet twice daily x 3 days.   Continuous Blood Gluc Sensor (FREESTYLE LIBRE 3 SENSOR) MISC 1 each by Does not apply route daily. Place 1  sensor on the skin every 14 days. Use to check glucose continuously   diazepam (VALIUM) 10 MG tablet Take 1 tablet (10 mg total) by mouth every 6 (six) hours as needed for anxiety.   DULoxetine (CYMBALTA) 60 MG capsule Take 1 capsule (60 mg total) by mouth daily.   Fluticasone-Umeclidin-Vilant ([name] ELLIPTA) 100-62.5-25 MCG/ACT AEPB Inhale 1 puff into the lungs daily.   HYDROcodone-acetaminophen (NORCO/VICODIN) 5-325 MG tablet Take 1 tablet by mouth every 4 (four) hours as needed for moderate pain.   Insulin Glargine-Lixisenatide (SOLIQUA) 100-33 UNT-MCG/ML SOPN Inject 8 Units into the skin daily.   levothyroxine (SYNTHROID) 75 MCG tablet Take 1 tablet (75 mcg total) by mouth daily before breakfast.   Magnesium Gluconate 250 MG TABS Take 1 tablet by mouth daily.   metoprolol tartrate (LOPRESSOR) 25 MG tablet Take 1 tablet (25 mg total) by mouth 2 (two) times daily.   miconazole (MICATIN) 2 % cream Apply 1 Application topically 2 (two) times daily.   OXYGEN Inhale 2 L into the lungs.   polyethylene glycol powder (GLYCOLAX/MIRALAX) 17 GM/SCOOP powder Take 17 g by mouth daily as needed for moderate constipation.   primidone (MYSOLINE) 50 MG tablet TAKE 1 TABLET BY MOUTH TWICE DAILY (Patient taking differently: Take 50 mg by mouth 2 (two) times daily.)   Probiotic Product (PROBIOTIC DAILY PO) Take 1 tablet by mouth daily.   Propylene Glycol (SYSTANE BALANCE OP) Place 1  drop into both eyes 4 (four) times daily as needed (dry eyes).   rOPINIRole (REQUIP) 1 MG tablet TAKE 1 TABLET(1 MG) BY MOUTH AT BEDTIME   topiramate (TOPAMAX) 50 MG tablet Take 1 tablet (50 mg total) by mouth at bedtime.   No facility-administered encounter medications on file as of 10/13/2021.     Laresia Green CMA Clinical Pharmacist Assistant [phone]  

## 2021-10-14 ENCOUNTER — Ambulatory Visit: Payer: PPO

## 2021-10-14 ENCOUNTER — Other Ambulatory Visit: Payer: Self-pay | Admitting: Medical

## 2021-10-15 ENCOUNTER — Telehealth: Payer: Self-pay | Admitting: Adult Health

## 2021-10-15 NOTE — Telephone Encounter (Signed)
.  Called patient to schedule appointment per 6/9 inbasket, patient is aware of date and time.   

## 2021-10-28 ENCOUNTER — Other Ambulatory Visit: Payer: Self-pay

## 2021-10-28 ENCOUNTER — Inpatient Hospital Stay: Payer: PPO | Attending: Adult Health | Admitting: Adult Health

## 2021-10-28 ENCOUNTER — Telehealth: Payer: Self-pay | Admitting: Medical

## 2021-10-28 VITALS — BP 142/85 | HR 117 | Resp 20 | Ht 68.0 in | Wt 256.3 lb

## 2021-10-28 DIAGNOSIS — Z853 Personal history of malignant neoplasm of breast: Secondary | ICD-10-CM | POA: Diagnosis not present

## 2021-10-28 DIAGNOSIS — C50212 Malignant neoplasm of upper-inner quadrant of left female breast: Secondary | ICD-10-CM | POA: Diagnosis not present

## 2021-10-28 DIAGNOSIS — Z170 Estrogen receptor positive status [ER+]: Secondary | ICD-10-CM

## 2021-10-28 NOTE — Telephone Encounter (Signed)
PT CALLED and would like you to give her a call She can be reached at [phone]

## 2021-10-29 ENCOUNTER — Other Ambulatory Visit: Payer: Self-pay | Admitting: Physical Medicine and Rehabilitation

## 2021-10-29 ENCOUNTER — Other Ambulatory Visit: Payer: Self-pay | Admitting: Medical

## 2021-10-29 NOTE — Progress Notes (Signed)
I have callled the patient to follow up with her.  Unable to reach patient. Left patient a voice mail to return my call.

## 2021-10-30 ENCOUNTER — Encounter: Payer: Self-pay | Admitting: Adult Health

## 2021-10-30 NOTE — Progress Notes (Signed)
[facility] Follow up:    Brandi Hurl, PA-C [address] [place] [address]   DIAGNOSIS:  Cancer Staging  History of left breast cancer Staging form: Breast, AJCC 7th Edition - Clinical stage from 07/22/2015: Stage Unknown (TX, N0, M0) - Unsigned Staged by: Pathologist and managing physician Laterality: Left Estrogen receptor status: Positive Progesterone receptor status: Positive HER2 status: Negative Stage used in treatment planning: Yes National guidelines used in treatment planning: Yes Type of national guideline used in treatment planning: NCCN - Pathologic: Stage IA (T1a, N0, cM0) - Unsigned   SUMMARY OF ONCOLOGIC HISTORY: Oncology History  History of left breast cancer  07/02/2015 Mammogram   1. Indeterminate left breast distortion. 2. Area of right breast distortion seen sonographically in the area of patient's focal pain. While this is favored to represent scarring related to patient's prior reduction surgery, biopsy is recommended to exclude malignancy.   07/13/2015 Initial Biopsy   1.Left Breast biopsy: IDC, lobular neoplasia, fibrocystic changes, ER+ (90%), PR+(70%), Ki67 3%, HER-2 negative (ratio 0.97). 2. Right breast core biopsy: fibrocystic changes with adenosis and calcifications.   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   10/22/2015 - 11/18/2015 Radiation Therapy   Adjuvant Radiation [facility]): The patient initially received a dose of 42.5 Gy in 17 fractions to the breast, boost of  7.5 Gy in 3 fractions to seroma.   02/17/2016 - 03/2021 Anti-estrogen oral therapy   Anastrozole $RemoveBefo'1mg'[id number]$  daily     CURRENT THERAPY: observation  INTERVAL HISTORY: Brandi Stephens  76 y.o. female returns for f/u of her history of left sided breast cancer.  She has many chronic illnesses that she is dealing with and has no concerns related to her breasts.  She has not yet scheduled her mammogram that is due later this month.     Patient Active Problem List   Diagnosis Date Noted   Frequent falls 08/05/2021   Elevated uric acid in blood 08/05/2021   Vitamin deficiency 08/05/2021   Chronic hypoxemic respiratory failure ([place]) 08/05/2021   Type II diabetes mellitus with complication ([place]) 56/38/9373   Other chronic pain 08/05/2021   Vaccine counseling 08/05/2021   Hypoxemia 07/02/2021   Weight gain 02/26/2021   Morbid obesity ([place])    Short-term memory loss 02/24/2020   Osteopenia 01/22/2020   Aortic atherosclerosis ([place]) 01/22/2020   Wheezing 01/22/2020   Insomnia 10/29/2019   Recurrent falls 09/06/2019   Confusion, postoperative    Chronic constipation    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[place]) 08/02/2019   Muscle spasm    Hypokalemia    Status post lumbar spinal fusion 07/10/2019   Postoperative pain    Labile blood pressure    Hypoalbuminemia due to protein-calorie malnutrition ([place])    Anxiety 07/05/2019   Depression 07/05/2019   Chronic diastolic CHF (congestive heart failure) ([place]) 07/05/2019   Obesity (BMI 30-39.9) 07/05/2019   ETOH abuse 07/05/2019   Lumbar radiculopathy 06/28/2019   Spinal stenosis of lumbar region 06/28/2019   Tremors of nervous system    Chronic pain syndrome    Osteomyelitis of lumbar spine ([place]) 06/25/2019   Muscular deconditioning    Nausea 06/24/2019   History of sepsis 06/24/2019   Recent major surgery 06/24/2019   History of diverticulosis 06/24/2019  Elevated PTHrP level 05/09/2019   Low back pain 02/12/2019   Neuropathy 12/20/2018   Lumbar spinal stenosis 06/29/2018   Abnormal CT of the chest 06/04/2018   Hypothyroidism 11/30/2017   Thoracic aortic atherosclerosis ([place]) 09/14/2016   RLS (restless legs syndrome) 09/09/2016    H/O adenomatous polyp of colon 07/06/2016   PVC's (premature ventricular contractions) 03/29/2016   Grade I diastolic dysfunction 42/70/6237   Former cigarette smoker 01/11/2016   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   Leg edema 01/11/2016   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   History of left breast cancer 07/14/2015   CKD (chronic kidney disease), stage III 07/08/2015   Dependent edema 06/08/2015   Sleep disturbance 06/08/2015   Mixed hyperlipidemia 06/08/2015   Generalized anxiety disorder 06/08/2015   Essential hypertension 06/08/2015   COPD (chronic obstructive pulmonary disease) ([place]) 06/08/2015   Gout 06/08/2015   Macrocytic anemia    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  MEDICAL HISTORY: Past Medical History:  Diagnosis Date   AIN (anal intraepithelial neoplasia) anal canal    Anemia    with pregnancy   Anxiety    Arthritis    knees, lower back, knees   Chronic constipation    Chronic diastolic CHF (congestive heart failure) ([place]) 07/05/2019   CKD (chronic kidney disease)    Coarse tremors    Essential   COPD (chronic obstructive pulmonary disease) ([place])    2017 chest CT, history of   Degenerative lumbar spinal stenosis    Depression    Depression 07/05/2019   Drug dependency ([place])    Elevated PTHrP level 05/09/2019   ETOH abuse 07/05/2019   GERD (gastroesophageal reflux disease)    pt denies   Gout    Grade I diastolic dysfunction 62/83/1517   Noted ECHO   Hemorrhoids    History of adenomatous polyp of colon    History of basal cell carcinoma (BCC) excision 2015   nose   History of sepsis 05/14/2014   post lumbar surgery   History of shingles    x2   Hyperlipidemia    Hypertension    Hypothyroidism    Irregular heart beat    history of while undergoing radiation   Malignant neoplasm of upper-inner quadrant of  left breast in female, estrogen receptor positive [facility]) oncologist-  dr Jana Hakim--  per lov in epic no recurrence   dx 07-13-2015  Left breast invasive ductal carcinoma, Stage IA, Grade 1 (TXN0),  09-16-2015  s/p  left breast lumpectomy with sln bx's,  completed radiation 11-18-2015,  started antiestrogen therapy 12-14-2015   Neuropathy    Left foot and back of left leg   Obesity (BMI 30-39.9) 07/05/2019   Personal history of radiation therapy    completed 11-18-2015  left breast   Pneumonia    PONV (postoperative nausea and vomiting)    Prediabetes    diet controlled, no med   Restless leg syndrome    Seasonal allergies    Seizure ([place]) 05/2015   due to sepsis, only one time episode   Tremor    Wears partial dentures    lower     SURGICAL HISTORY: Past Surgical History:  Procedure Laterality Date   ABDOMINAL HYSTERECTOMY  1985   BASAL CELL CARCINOMA EXCISION  10/15   nose   BREAST BIOPSY Right 08/24/2015   BREAST BIOPSY Right 07/13/2015   BREAST BIOPSY Left  07/13/2015   BREAST EXCISIONAL BIOPSY Left 1972   BREAST LUMPECTOMY Left    BREAST LUMPECTOMY WITH RADIOACTIVE SEED AND SENTINEL LYMPH NODE BIOPSY Left 09/16/2015   Procedure: BREAST LUMPECTOMY WITH RADIOACTIVE SEED AND SENTINEL LYMPH NODE BIOPSY;  Surgeon: Stark Klein, MD;  Location: [facility];  Service: General;  Laterality: Left;   BREAST REDUCTION SURGERY Bilateral 1998   CATARACT EXTRACTION W/ INTRAOCULAR LENS IMPLANT Left 2016   CERVICAL SPINE SURGERY  1995   hallo   COLONOSCOPY  01/2013   polyps   EYE SURGERY Left 2016   HAND SURGERY Left 02-19-2002   dr Fredna Dow  _0    repair collateral ligament/ MPJ left thumb   HARDWARE REMOVAL N/A 07/24/2019   Procedure: REVISION OF HARDWARE Lumbar two - Sacral one. Extention of Fusion to Lumbar one;  Surgeon: [name], MD;  Location: [facility];  Service: Neurosurgery;  Laterality: N/A;   HEMORRHOID SURGERY  2008   HIGH RESOLUTION ANOSCOPY N/A 02/28/2018    Procedure: HIGH RESOLUTION ANOSCOPY WITH BIOPSY;  Surgeon: Leighton Ruff, MD;  Location: [facility];  Service: General;  Laterality: N/A;   KNEE ARTHROSCOPY Right 2002   LUMBAR SPINE SURGERY  03-20-2015   dr Kathyrn Sheriff   fusion L4-5, L5-S1   MOUTH SURGERY  07/14/2018   2 infected teeth removed   MULTIPLE TOOTH EXTRACTIONS     with bone grafting lower bottom right and left   REDUCTION MAMMAPLASTY Bilateral    RIGHT COLECTOMY  09-10-2003   dr Margot Chimes _1    multiple colon polyps (per path tubular adenoma's, hyperplastic , benign appendix, benign two lymph nodes   ROTATOR CUFF REPAIR Left 04/2016   TONSILLECTOMY  1969   TUBAL LIGATION Bilateral 1982    SOCIAL HISTORY: Social History   Socioeconomic History   Marital status: Married    Spouse name: robert   Number of children: 3   Years of education: college   Highest education level: Not on file  Occupational History   Occupation: retired  Tobacco Use   Smoking status: Former    Packs/day: 0.50    Years: 35.00    Total pack years: 17.50    Types: Cigarettes    Quit date: 05/22/2018    Years since quitting: 3.4   Smokeless tobacco: Never  Vaping Use   Vaping Use: Never used  Substance and Sexual Activity   Alcohol use: Yes    Comment: 1 glass of wine a night   Drug use: Not Currently    Comment: CBD tincture   Sexual activity: Yes    Birth control/protection: Post-menopausal  Other Topics Concern   Not on file  Social History Narrative   Lives with husband in a 2 story home.  Has 2 living children.  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  Education: college.    Right-handed.   Five cups coffee per week.   Social Determinants of Health   Financial Resource Strain: Low Risk  (02/26/2021)   Overall Financial Resource Strain (CARDIA)    Difficulty of Paying Living Expenses: Not hard at all  Food Insecurity: No Food Insecurity (02/26/2021)   Hunger Vital Sign    Worried About Running Out of  Food in the Last Year: Never true    Ran Out of Food in the Last Year: Never true  Transportation Needs: No Transportation Needs (02/26/2021)   PRAPARE - Hydrologist (Medical): No    Lack of Transportation (  Non-Medical): No  Physical Activity: Inactive (02/26/2021)   Exercise Vital Sign    Days of Exercise per Week: 0 days    Minutes of Exercise per Session: 0 min  Stress: Stress Concern Present (02/26/2021)   [place]    Feeling of Stress : To some extent  Social Connections: Not on file  Intimate Partner Violence: Not on file    FAMILY HISTORY: Family History  Problem Relation Age of Onset   Atrial fibrillation Mother    Ulcers Father    Breast cancer Maternal Aunt    Lung cancer Maternal Uncle    Stomach cancer Maternal Grandmother    Lung cancer Maternal Aunt    Brain cancer Maternal Aunt    Prostate cancer Maternal Grandfather    Stroke Paternal Grandmother    Tuberculosis Paternal Grandfather    Colon cancer Neg Hx    Esophageal cancer Neg Hx    Rectal cancer Neg Hx     Review of Systems  Constitutional:  Positive for fatigue (chronic). Negative for appetite change, chills, fever and unexpected weight change.  HENT:   Negative for hearing loss, lump/mass and trouble swallowing.   Eyes:  Negative for eye problems and icterus.  Respiratory:  Positive for shortness of breath (chronic). Negative for chest tightness and cough.   Cardiovascular:  Negative for chest pain, leg swelling and palpitations.  Gastrointestinal:  Negative for abdominal distention, abdominal pain, constipation, diarrhea, nausea and vomiting.  Endocrine: Negative for hot flashes.  Genitourinary:  Negative for difficulty urinating.   Musculoskeletal:  Negative for arthralgias.  Skin:  Negative for itching and rash.  Neurological:  Negative for dizziness, extremity weakness, headaches and numbness.   Hematological:  Negative for adenopathy. Does not bruise/bleed easily.  Psychiatric/Behavioral:  Negative for depression. The patient is not nervous/anxious.       PHYSICAL EXAMINATION  ECOG PERFORMANCE STATUS: 3 - Symptomatic, >50% confined to bed  Vitals:   10/28/21 1228  BP: (!) 142/85  Pulse: (!) 117  Resp: 20  SpO2: 98%    Physical Exam Constitutional:      General: She is not in acute distress.    Appearance: Normal appearance. She is ill-appearing (chronically ill appearing). She is not toxic-appearing.     Comments: Examined in wheelchair  HENT:     Head: Normocephalic and atraumatic.  Eyes:     General: No scleral icterus. Cardiovascular:     Rate and Rhythm: Normal rate and regular rhythm.     Pulses: Normal pulses.     Heart sounds: Normal heart sounds.  Pulmonary:     Effort: Pulmonary effort is normal.     Comments: Diminished in bases Chest:     Comments: Left breast s/p lumpectomy and radiation, no sign of local recurrence, right breast benign  Examined in wheelchair (limited exam) Musculoskeletal:        General: No swelling.     Cervical back: Neck supple.  Lymphadenopathy:     Cervical: No cervical adenopathy.  Skin:    General: Skin is warm and dry.     Findings: No rash.  Neurological:     General: No focal deficit present.     Mental Status: She is alert.  Psychiatric:        Mood and Affect: Mood normal.        Behavior: Behavior normal.     LABORATORY DATA: None for this visit.  ASSESSMENT and THERAPY PLAN:  History of left breast cancer Brandi Stephens is a 51 year old woman with h/o stage IA left sided breast cancer diagnosed in 2017 s/p lumpectomy, adjuvant radiation therapy, and antiestrogen therapy with Anastrozole that completed in 03/2021.  Brandi Stephens has no clinical signs of breast cancer recurrence.  She continues on observation and requests to return every year for f/u with [place], which we are happy to accommodate.    I placed  orders for her mammogram which is due later this month.  I recommended continued annual mammograms.    I reviewed healthy diet and continued f/u with her other healthcare providers per their recommendations.    Brandi Stephens will return in 1 year for continued f/u.       All questions were answered. The patient knows to call the clinic with any problems, questions or concerns. We can certainly see the patient much sooner if necessary.  Total encounter time:20 minutes*in face-to-face visit time, chart review, lab review, care coordination, order entry, and documentation of the encounter time.    Wilber Bihari, NP 10/30/21 12:47 PM Medical Oncology and Hematology [facility] [place], [place] [address] Tel. [phone]    Fax. [phone]  *Total Encounter Time as defined by the Centers for Medicare and Medicaid Services includes, in addition to the face-to-face time of a patient visit (documented in the note above) non-face-to-face time: obtaining and reviewing outside history, ordering and reviewing medications, tests or procedures, care coordination (communications with other health care professionals or caregivers) and documentation in the medical record.

## 2021-10-30 NOTE — Assessment & Plan Note (Signed)
Brandi Stephens is a 76 year old woman with h/o stage IA left sided breast cancer diagnosed in 2017 s/p lumpectomy, adjuvant radiation therapy, and antiestrogen therapy with Anastrozole that completed in 03/2021.  Brandi Stephens has no clinical signs of breast cancer recurrence.  She continues on observation and requests to return every year for f/u with [place], which we are happy to accommodate.    I placed orders for her mammogram which is due later this month.  I recommended continued annual mammograms.    I reviewed healthy diet and continued f/u with her other healthcare providers per their recommendations.    Brandi Stephens will return in 1 year for continued f/u.

## 2021-10-31 ENCOUNTER — Other Ambulatory Visit: Payer: Self-pay | Admitting: Physical Medicine and Rehabilitation

## 2021-11-01 NOTE — Telephone Encounter (Signed)
Pt Assistance Ozempic received 10/13/21

## 2021-11-01 NOTE — Telephone Encounter (Signed)
Called patient but unable to reach. Left a voicemail requesting a call back.

## 2021-11-01 NOTE — Telephone Encounter (Signed)
Patient called back. Patient was inquiring about any updates with Ozempic as she still has not received her 4 month supply. Will follow up on the status prior to Thursday this week for in office visit.

## 2021-11-03 ENCOUNTER — Telehealth: Payer: Self-pay | Admitting: Pharmacist

## 2021-11-03 NOTE — Chronic Care Management (AMB) (Signed)
Chronic Care Management Pharmacy Assistant   Name: ADRIONA KANEY  MRN: [id number] DOB: 03-31-1946  11/03/21 APPOINTMENT REMINDER    Patient was reminded to have all medications, supplements and any blood glucose and blood pressure readings available for review with Jeni Salles, Pharm. D, for office visit on 11/04/21 at 2.    Care Gaps: Foot Exam - Overdue Eye Exam - Overdue Urine Micro - Overdue COVID Booster - Overdue AWV- 10/14/21 BP- 142/85 10/28/21 Lab Results  Component Value Date   HGBA1C 6.6 (H) 08/13/2021    Star Rating Drug: Atorvastatin 20 mg - Last filled 08/10/21 90 DS at [facility] Dulaglutide (Trulicity) 9.32 mg - Last filled 09/24/21 30 DS at [facility]        Medications: Outpatient Encounter Medications as of 11/03/2021  Medication Sig   allopurinol (ZYLOPRIM) 300 MG tablet Take 1 tablet (300 mg total) by mouth daily. 1 daily   amitriptyline (ELAVIL) 100 MG tablet Take 1 tablet (100 mg total) by mouth at bedtime.   atorvastatin (LIPITOR) 80 MG tablet Take 1 tablet (80 mg total) by mouth daily.   betamethasone dipropionate 0.05 % cream Apply 1 application topically daily as needed (irritated skin).   Biotin 3 MG TABS Take 3 mg by mouth daily.   blood glucose meter kit and supplies KIT Dispense based on patient and insurance preference. Use up to four times daily as directed.   bumetanide (BUMEX) 1 MG tablet Take 1 mg by mouth daily.   cefadroxil (DURICEF) 500 MG capsule Take 1 capsule (500 mg total) by mouth 2 (two) times daily.   Cholecalciferol (VITAMIN D3) 50 MCG (2000 UT) capsule Take 1 capsule (2,000 Units total) by mouth daily.   colchicine 0.6 MG tablet Take 2 tablets at once then take 1 tablet an hour later then then 1 tablet twice daily x 3 days.   Continuous Blood Gluc Sensor (FREESTYLE LIBRE 3 SENSOR) MISC 1 each by Does not apply route daily. Place 1 sensor on the skin every 14 days. Use to check glucose continuously   diazepam (VALIUM) 10  MG tablet Take 1 tablet (10 mg total) by mouth every 6 (six) hours as needed for anxiety.   DULoxetine (CYMBALTA) 60 MG capsule TAKE 1 CAPSULE(60 MG) BY MOUTH DAILY   Fluticasone-Umeclidin-Vilant ([name] ELLIPTA) 100-62.5-25 MCG/ACT AEPB Inhale 1 puff into the lungs daily.   HYDROcodone-acetaminophen (NORCO/VICODIN) 5-325 MG tablet Take 1 tablet by mouth every 4 (four) hours as needed for moderate pain.   Insulin Glargine-Lixisenatide (SOLIQUA) 100-33 UNT-MCG/ML SOPN Inject 8 Units into the skin daily.   levothyroxine (SYNTHROID) 75 MCG tablet TAKE 1 TABLET(75 MCG) BY MOUTH DAILY BEFORE BREAKFAST   Magnesium Gluconate 250 MG TABS Take 1 tablet by mouth daily.   metoprolol tartrate (LOPRESSOR) 25 MG tablet Take 1 tablet (25 mg total) by mouth 2 (two) times daily.   miconazole (MICATIN) 2 % cream Apply 1 Application topically 2 (two) times daily.   OXYGEN Inhale 2 L into the lungs.   polyethylene glycol powder (GLYCOLAX/MIRALAX) 17 GM/SCOOP powder Take 17 g by mouth daily as needed for moderate constipation.   primidone (MYSOLINE) 50 MG tablet TAKE 1 TABLET BY MOUTH TWICE DAILY   Probiotic Product (PROBIOTIC DAILY PO) Take 1 tablet by mouth daily.   Propylene Glycol (SYSTANE BALANCE OP) Place 1 drop into both eyes 4 (four) times daily as needed (dry eyes).   rOPINIRole (REQUIP) 1 MG tablet TAKE 1 TABLET(1 MG) BY MOUTH AT BEDTIME  topiramate (TOPAMAX) 50 MG tablet Take 1 tablet (50 mg total) by mouth at bedtime.   No facility-administered encounter medications on file as of 11/03/2021.       [facility] Clinical Pharmacist Assistant [phone]

## 2021-11-04 ENCOUNTER — Ambulatory Visit (INDEPENDENT_AMBULATORY_CARE_PROVIDER_SITE_OTHER): Payer: PPO | Admitting: Pharmacist

## 2021-11-04 DIAGNOSIS — J449 Chronic obstructive pulmonary disease, unspecified: Secondary | ICD-10-CM | POA: Diagnosis not present

## 2021-11-04 DIAGNOSIS — N1831 Chronic kidney disease, stage 3a: Secondary | ICD-10-CM

## 2021-11-04 DIAGNOSIS — E118 Type 2 diabetes mellitus with unspecified complications: Secondary | ICD-10-CM

## 2021-11-04 NOTE — Progress Notes (Signed)
Chronic Care Management Pharmacy Note  11/04/2021 Name:  Brandi Stephens MRN:  [id number] DOB:  Apr 12, 1946  Summary: A1c at goal < 7% Pt received first shipment of Ozempic from PAP Pt is tolerating Ozempic without side effects  Recommendations/Changes made from today's visit: -Demonstrated proper use and placement of Freestyle Libre -Removed Soliqua from medication list as pt never started due to [place] start  Plan: Follow up in 2.5 months Reapply for Ozempic PAP at that time   Subjective: Brandi Stephens is an 76 y.o. year old female who is a primary patient of Carlena Hurl, PA-C.  The CCM team was consulted for assistance with disease management and care coordination needs.    Engaged with patient by telephone for follow up visit in response to provider referral for pharmacy case management and/or care coordination services.   Consent to Services:  The patient was given information about Chronic Care Management services, agreed to services, and gave verbal consent prior to initiation of services.  Please see initial visit note for detailed documentation.   Patient Care Team: Tysinger, Camelia Eng, PA-C as PCP - General (Family Medicine) Janina Mayo, MD as PCP - Cardiology (Cardiology) Stark Klein, MD as Consulting Physician (General Surgery) [name], MD as Consulting Physician (Family Medicine) Griselda Miner, MD as Consulting Physician (Dermatology) Delice Bison, Charlestine Massed, NP as Nurse Practitioner (Hematology and Oncology) Marchia Bond, MD as Consulting Physician (Orthopedic Surgery) Armbruster, Carlota Raspberry, MD as Consulting Physician (Gastroenterology) [name], MD as Consulting Physician (Neurosurgery) Marcial Pacas, MD as Consulting Physician (Neurology) Viona Gilmore, [facility] as Pharmacist (Pharmacist)  Recent office visits: 08/05/21 Chana Bode,  PA-C: Patient presented for med check. Plan for 3 month fasting follow up. Increased  allopurinol due to increased uric acid level. Increased Trulicity to 3 mg weekly.  08/05/21 Chana Bode, PA-C: Patient presented for acute cough. Prescribed prednisone, clarithromycin and benzonatate.   Recent consult visits: 10/28/21 Wilber Bihari, NP (oncology): Patient presented for breast cancer follow up. Follow up in 1 year.  09/14/21 Phineas Inches, MD (cardiology): Patient presented for palpitations. Plan for 7 day Zio monitor and EKG.  08/27/21 Raulkar, Clide Deutscher, MD (Phys Med): Patient presented for Idiopathic progressive neuropathy and other concerns.  08/25/21 [name], MD (Inf Disease) - Patient presented for osteomyelitis of spine follow up. Plan for chronic suppressive cefadroxil. Follow up in 2-3 months.  08/24/21 Raulkar, Clide Deutscher, MD (Phys Med): Patient presented for Idiopathic progressive neuropathy and other concerns. Follow up in 3 months and given Qutenza patches.  08/10/21 [name], MD (Inf Disease) - Patient presented for osteomyelitis of spine follow up. Plan for chronic suppressive cefadroxil.  08/10/21 Raulkar, Clide Deutscher, MD (Phys Med): Patient presented for Idiopathic progressive neuropathy and other concerns. Follow up in 3 months and given Qutenza patches.  07/27/21 Sherrilyn Rist, MD (pulmonary): Patient presented for centrilobular emphysema follow up. Follow up in 6 months.  07/06/21 [name], MD (Inf Disease) - Patient presented for osteomyelitis of spine follow up. Continue cephalexin and follow up in 1 month.  04/28/21 Sherrilyn Rist, MD (pulmonary): Patient presented for centrilobular emphysema follow up. Sampled [name] 1 puff daily and continued albuterol as needed.   Hospital visits: None in previous 6 months.  Objective:  Lab Results  Component Value Date   CREATININE 0.95 08/13/2021   BUN 21 08/13/2021   GFRNONAA 38 (L) 01/26/2021   GFRAA 52 (L) 06/04/2020   NA 142 08/13/2021   K 4.2 08/13/2021  CALCIUM 9.7 08/13/2021   CO2 26  08/13/2021   GLUCOSE 135 (H) 08/13/2021    Lab Results  Component Value Date/Time   HGBA1C 6.6 (H) 08/13/2021 02:26 PM   HGBA1C 7.0 (H) 01/19/2021 04:03 AM    Last diabetic Eye exam: No results found for: "HMDIABEYEEXA"  Last diabetic Foot exam: No results found for: "HMDIABFOOTEX"   Lab Results  Component Value Date   CHOL 180 08/13/2021   HDL 46 08/13/2021   LDLCALC 80 08/13/2021   TRIG 336 (H) 08/13/2021   CHOLHDL 3.9 08/13/2021       Latest Ref Rng & Units 08/13/2021    2:26 PM 04/27/2021    1:33 PM 04/01/2021   12:42 PM  Hepatic Function  Total Protein 6.0 - 8.5 g/dL 6.5  6.3  7.2   Albumin 3.7 - 4.7 g/dL 4.4  4.4  4.8   AST 0 - 40 IU/L _0 ALT 0 - 32 IU/L _1 Alk Phosphatase 44 - 121 IU/L 122  112  111   Total Bilirubin 0.0 - 1.2 mg/dL 0.4  0.2  0.4     Lab Results  Component Value Date/Time   TSH 2.200 04/01/2021 12:42 PM   TSH 4.495 01/19/2021 04:03 AM   TSH 3.530 06/04/2020 03:11 PM   FREET4 1.15 06/04/2020 03:11 PM   FREET4 1.24 01/31/2020 10:40 AM       Latest Ref Rng & Units 08/13/2021    2:26 PM 04/01/2021   12:42 PM 03/23/2021    1:12 PM  CBC  WBC 3.4 - 10.8 x10E3/uL 12.1  12.0  12.7   Hemoglobin 11.1 - 15.9 g/dL 11.8  12.6  13.0   Hematocrit 34.0 - 46.6 % 34.7  37.7  39.4   Platelets 150 - 450 x10E3/uL 243  257  282     Lab Results  Component Value Date/Time   VD25OH 56.9 08/13/2021 02:26 PM   VD25OH 32.9 01/31/2020 10:40 AM    Clinical ASCVD: No  The 10-year ASCVD risk score (Arnett DK, et al., 2019) is: 42.7%   Values used to calculate the score:     Age: 54 years     Sex: Female     Is Non-Hispanic African American: No     Diabetic: Yes     Tobacco smoker: No     Systolic Blood Pressure: 553 mmHg     Is BP treated: Yes     HDL Cholesterol: 46 mg/dL     Total Cholesterol: 180 mg/dL       08/25/2021   10:32 AM 08/24/2021    9:36 AM 08/10/2021    2:37 PM  Depression screen PHQ 2/9  Decreased Interest 0 0 0   Down, Depressed, Hopeless 0 0 0  PHQ - 2 Score 0 0 0     Social History   Tobacco Use  Smoking Status Former   Packs/day: 0.50   Years: 35.00   Total pack years: 17.50   Types: Cigarettes   Quit date: 05/22/2018   Years since quitting: 3.4  Smokeless Tobacco Never   BP Readings from Last 3 Encounters:  10/28/21 (!) 142/85  09/14/21 104/62  08/24/21 105/70   Pulse Readings from Last 3 Encounters:  10/28/21 (!) 117  09/14/21 (!) 122  08/24/21 (!) 112   Wt Readings from Last 3 Encounters:  10/28/21 256 lb 4.8 oz (116.3 kg)  09/14/21 262 lb (118.8 kg)  08/24/21 260 lb (117.9 kg)   BMI Readings from Last 3 Encounters:  10/28/21 38.97 kg/m  09/14/21 39.84 kg/m  08/24/21 39.53 kg/m    Assessment/Interventions: Review of patient past medical history, allergies, medications, health status, including review of consultants reports, laboratory and other test data, was performed as part of comprehensive evaluation and provision of chronic care management services.   SDOH:  (Social Determinants of Health) assessments and interventions performed: Yes  SDOH Screenings   Alcohol Screen: Not on file  Depression (PHQ2-9): Low Risk  (08/25/2021)   Depression (PHQ2-9)    PHQ-2 Score: 0  Financial Resource Strain: Low Risk  (02/26/2021)   Overall Financial Resource Strain (CARDIA)    Difficulty of Paying Living Expenses: Not hard at all  Food Insecurity: No Food Insecurity (02/26/2021)   Hunger Vital Sign    Worried About Running Out of Food in the Last Year: Never true    Ran Out of Food in the Last Year: Never true  Housing: Not on file  Physical Activity: Inactive (02/26/2021)   Exercise Vital Sign    Days of Exercise per Week: 0 days    Minutes of Exercise per Session: 0 min  Social Connections: Not on file  Stress: Stress Concern Present (02/26/2021)   [place]    Feeling of Stress : To some extent   Tobacco Use: Medium Risk (10/30/2021)   Patient History    Smoking Tobacco Use: Former    Smokeless Tobacco Use: Never    Passive Exposure: Not on file  Transportation Needs: No Transportation Needs (02/26/2021)   PRAPARE - Hydrologist (Medical): No    Lack of Transportation (Non-Medical): No    CCM Care Plan  Allergies  Allergen Reactions   Ampicillin Hives and Other (See Comments)    Severe reaction in February 2017 1.5 month to have hives to go away   Gabapentin Swelling    UNSPECIFIED REACTION    Penicillins Hives and Other (See Comments)    Severe reaction in February 2017 1.5 month to have hives to go away Has patient had a PCN reaction causing immediate rash, facial/tongue/throat swelling, SOB or lightheadedness with hypotension: #  #  #  YES  #  #  #  Has patient had a PCN reaction causing severe rash involving mucus membranes or skin necrosis: No Has patient had a PCN reaction that required hospitalization No Has patient had a PCN reaction occurring within the last 10 years: No    Codeine Nausea And Vomiting   Other Itching    UNSPECIFIED Analgesics    Medications Reviewed Today     Reviewed by Viona Gilmore, [facility] (Pharmacist) on 11/04/21 at 1455  Med List Status: <None>   Medication Order Taking? Sig Documenting Provider Last Dose Status Informant  allopurinol (ZYLOPRIM) 300 MG tablet [id number]  Take 1 tablet (300 mg total) by mouth daily. 1 daily Tysinger, Leward Quan  Active   amitriptyline (ELAVIL) 100 MG tablet [id number]  Take 1 tablet (100 mg total) by mouth at bedtime. Izora Ribas, MD  Active   atorvastatin (LIPITOR) 80 MG tablet [id number]  Take 1 tablet (80 mg total) by mouth daily. [name], MD  Active   betamethasone dipropionate 0.05 % cream [id number]  Apply 1 application topically daily as needed (irritated skin). [provider]  Active Self  Biotin 3 MG TABS [id number]  Take 3 mg by  mouth daily.  [provider]  Active Self  blood glucose meter kit and supplies KIT [id number]  Dispense based on patient and insurance preference. Use up to four times daily as directed. Hosie Poisson, MD  Active   bumetanide (BUMEX) 1 MG tablet [id number]  Take 1 mg by mouth daily. [provider]  Active   cefadroxil (DURICEF) 500 MG capsule [id number]  Take 1 capsule (500 mg total) by mouth 2 (two) times daily. [name], MD  Active   Cholecalciferol (VITAMIN D3) 50 MCG (2000 UT) capsule [id number]  Take 1 capsule (2,000 Units total) by mouth daily. Tysinger, Camelia Eng, PA-C  Active   colchicine 0.6 MG tablet [id number]  Take 2 tablets at once then take 1 tablet an hour later then then 1 tablet twice daily x 3 days. Tysinger, Camelia Eng, PA-C  Active   Continuous Blood Gluc Sensor (FREESTYLE LIBRE 3 SENSOR) [place] [id number]  1 each by Does not apply route daily. Place 1 sensor on the skin every 14 days. Use to check glucose continuously Tysinger, Camelia Eng, PA-C  Active   diazepam (VALIUM) 10 MG tablet [id number]  Take 1 tablet (10 mg total) by mouth every 6 (six) hours as needed for anxiety. Izora Ribas, MD  Active   DULoxetine (CYMBALTA) 60 MG capsule [id number]  TAKE 1 CAPSULE(60 MG) BY MOUTH DAILY Tysinger, Camelia Eng, PA-C  Active   Fluticasone-Umeclidin-Vilant ([name] ELLIPTA) 100-62.5-25 MCG/ACT AEPB [id number]  Inhale 1 puff into the lungs daily. Laurin Coder, MD  Active   HYDROcodone-acetaminophen (NORCO/VICODIN) 5-325 MG tablet [id number]  Take 1 tablet by mouth every 4 (four) hours as needed for moderate pain. Izora Ribas, MD  Active   levothyroxine (SYNTHROID) 75 MCG tablet [id number]  TAKE 1 TABLET(75 MCG) BY MOUTH DAILY BEFORE BREAKFAST Caryl Ada  Active   Magnesium Gluconate 250 MG TABS [id number]  Take 1 tablet by mouth daily. [provider]  Active   metoprolol tartrate (LOPRESSOR) 25 MG tablet [id number]  Take 1 tablet (25 mg total) by mouth 2 (two)  times daily. [name], MD  Active   miconazole (MICATIN) 2 % cream [id number]  Apply 1 Application topically 2 (two) times daily. Izora Ribas, MD  Active   OXYGEN [id number]  Inhale 2 L into the lungs. [provider]  Active   polyethylene glycol powder (GLYCOLAX/MIRALAX) 17 GM/SCOOP powder [id number]  Take 17 g by mouth daily as needed for moderate constipation. [provider]  Active Self  primidone (MYSOLINE) 50 MG tablet [id number]  TAKE 1 TABLET BY MOUTH TWICE DAILY Raulkar, Clide Deutscher, MD  Active   Probiotic Product (PROBIOTIC DAILY PO) [id number]  Take 1 tablet by mouth daily. [provider]  Active Self  Propylene Glycol (SYSTANE BALANCE OP) [id number]  Place 1 drop into both eyes 4 (four) times daily as needed (dry eyes). [provider]  Active Self  rOPINIRole (REQUIP) 1 MG tablet [id number]  TAKE 1 TABLET(1 MG) BY MOUTH AT BEDTIME [name], MD  Active   Semaglutide,0.25 or 0.5MG /DOS, (OZEMPIC, 0.25 OR 0.5 MG/DOSE,) 2 MG/1.5ML SOPN [id number] Yes Inject 0.5 mg into the skin once a week. [provider] Taking Active   topiramate (TOPAMAX) 50 MG tablet [id number]  Take 1 tablet (50 mg total) by mouth at bedtime. Izora Ribas, MD  Active             Patient Active Problem List   Diagnosis Date Noted  Frequent falls 08/05/2021   Elevated uric acid in blood 08/05/2021   Vitamin deficiency 08/05/2021   Chronic hypoxemic respiratory failure ([place]) 08/05/2021   Type II diabetes mellitus with complication ([place]) 42/87/6811   Other chronic pain 08/05/2021   Vaccine counseling 08/05/2021   Hypoxemia 07/02/2021   Weight gain 02/26/2021   Morbid obesity ([place])    Short-term memory loss 02/24/2020   Osteopenia 01/22/2020   Aortic atherosclerosis ([place]) 01/22/2020   Wheezing 01/22/2020   Insomnia 10/29/2019   Recurrent falls 09/06/2019   Confusion, postoperative    Chronic constipation    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   Muscle spasm    Hypokalemia    Status post lumbar spinal fusion 07/10/2019   Postoperative pain    Labile blood pressure    Hypoalbuminemia due to protein-calorie malnutrition ([place])    Anxiety 07/05/2019   Depression 07/05/2019   Chronic diastolic CHF (congestive heart failure) ([place]) 07/05/2019   Obesity (BMI 30-39.9) 07/05/2019   ETOH abuse 07/05/2019   Lumbar radiculopathy 06/28/2019   Spinal stenosis of lumbar region 06/28/2019   Tremors of nervous system    Chronic pain syndrome    Osteomyelitis of lumbar spine ([place]) 06/25/2019   Muscular deconditioning    Nausea 06/24/2019   History of sepsis 06/24/2019   Recent major surgery 06/24/2019   History of diverticulosis 06/24/2019   Elevated PTHrP level 05/09/2019   Low back pain 02/12/2019   Neuropathy 12/20/2018   Lumbar spinal stenosis 06/29/2018   Abnormal CT of the chest 06/04/2018   Hypothyroidism 11/30/2017   Thoracic aortic atherosclerosis ([place]) 09/14/2016   RLS (restless legs syndrome) 09/09/2016   H/O adenomatous polyp of colon 07/06/2016   PVC's (premature ventricular contractions) 03/29/2016   Grade I diastolic dysfunction 57/26/2035   Former cigarette smoker 01/11/2016   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   Leg edema 01/11/2016   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   History of left breast cancer 07/14/2015   CKD (chronic kidney disease), stage III 07/08/2015   Dependent edema 06/08/2015   Sleep disturbance 06/08/2015   Mixed hyperlipidemia 06/08/2015   Generalized anxiety disorder 06/08/2015   Essential hypertension 06/08/2015   COPD (chronic obstructive pulmonary disease) ([place]) 06/08/2015   Gout 06/08/2015   Macrocytic anemia    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 She almost fell yesterday and twisted her midsection so she is having some pain from that.   Patient reports she is excited by the amount of weight she has lost. She is now down about 30 lbs and has 40-45 to go as a goal. She denies any side effects with the Ozempic and may have jumped the gun some on the titration schedule with giving herself 0.5 mg of Ozempic early but is not having any issues with giving it or side effects from starting the higher dose earlier.  Patient brought Freestyle Libre CGM supplies and presents today for education on use and initial placement.  I have reviewed proper use, including but not limited to: glucose  direction and how to incorporate it in treatment decisions including meal insulin and exercise, evaluating previous evening trends first thing in the morning. Sensor lag was explained.   Procedure and information discussed with patient. Patient has no further questions at this time. Verbalizes understanding and agrees to have CGM placed.  Posterior aspect of right arm cleaned and prepared as instructed by manufactures instructions.    Freestyle Libre CGM placed to posterior aspect of right arm. No redness, swelling, bleeding, or bruising noted.   cleaned and prepared as instructed by manufacturers instructions. Freestyle Libre CGM placed as instructed by manufacturers instructions. No redness, swelling, bleeding, or bruising noted. Sensor activated and monitoring at this time. Patient is aware to resume normal activities including bathing, getting dressed, and exercise.   Conditions to be  addressed/monitored:  Hypertension, Hyperlipidemia, Diabetes, Heart Failure, COPD, Hypothyroidism, Osteopenia, Gout, and Insomnia, Neuropathy  Conditions addressed this visit: Diabetes, hypertension   Care Plan : CCM Pharmacy Care Plan  Updates made by Viona Gilmore, [place] since 11/04/2021 12:00 AM     Problem: Problem: Hypertension, Hyperlipidemia, Diabetes, Heart Failure, COPD, Hypothyroidism, Osteopenia, Gout, and Insomnia, Neuropathy      Long-Range Goal: Patient-Specific Goal   Start Date: 04/29/2021  Expected End Date: 04/29/2022  Recent Progress: On track  Priority: High  Note:   Current Barriers:  Unable to independently monitor therapeutic efficacy Unable to achieve control of cholesterol   Pharmacist Clinical Goal(s):  Patient will achieve adherence to monitoring guidelines and medication adherence to achieve therapeutic efficacy achieve control of cholesterol as evidenced by next lipid panel  through collaboration with PharmD and provider.   Interventions: 1:1 collaboration with Tysinger, Camelia Eng, PA-C regarding development and update of comprehensive plan of care as evidenced by provider attestation and co-signature Inter-disciplinary care team collaboration (see longitudinal plan of care) Comprehensive medication review performed; medication list updated in electronic medical record  Hypertension (BP goal <130/80) -Controlled -Current treatment: Metoprolol tartrate 25 mg 1 tablet twice daily - Appropriate, Effective, Safe, Accessible -Medications previously tried: unknown  -Current home readings: 105/70 (has a cuff) - PT and nurse coming out and checking -Current dietary habits: limits salt intake -Current exercise habits: minimal due to pain -Denies hypotensive/hypertensive symptoms -Educated on BP goals and benefits of medications for prevention of heart attack, stroke and kidney damage; Importance of home blood pressure monitoring; Proper BP monitoring  technique; -Counseled to monitor BP at home weekly, document, and provide log at future appointments -Counseled on diet and exercise extensively Recommended to continue current medication  Hyperlipidemia: (LDL goal < 70) -Uncontrolled -Current treatment: Atorvastatin 80 mg 1 tablet daily - Appropriate, Query effective, Safe, Accessible Cod liver oil 2 capsules 3 times a day (415 mg; EPA 37 mg & DHA 36 mg)  - Appropriate, Query effective, Safe, Accessible -Medications previously tried: Optometrist (cost)  -Current dietary patterns: doesn't eat fried foods - usually baked or broiled; olive oil; drinks a lot of water not doing much carbs (1 glass of wine every night) -Current exercise habits: minimal due to pain -Educated on Cholesterol goals;  Benefits of statin for ASCVD risk reduction; Importance of limiting foods high in cholesterol; Exercise goal of 150 minutes per week; -Counseled on diet and exercise extensively Recommended to continue current medication Recommended repeat lipid panel. Consider switching to Vascepa for triglyceride lowering.   Diabetes (A1c goal <7%) -Controlled -Current medications: Ozempic 0.5 mg inject once weekly - Appropriate, Effective, Safe, Accessible -Medications previously tried: Musician (cost) -Current home glucose  readings fasting glucose: 120-130s post prandial glucose: not checking -Denies hypoglycemic/hyperglycemic symptoms -Current meal patterns:  breakfast: did not discuss  lunch: did not discuss   dinner: did not discuss  snacks: did not discuss  drinks: did not discuss  -Current exercise: unable to with pain -Educated on A1c and blood sugar goals; Complications of diabetes including kidney damage, retinal damage, and cardiovascular disease; -Counseled to check feet daily and get yearly eye exams -Recommended to continue current medication Assessed patient finances. Patient is approved for Ozempic PAP and picked up her first shipment  today.  Heart Failure (Goal: manage symptoms and prevent exacerbations) -Controlled -Last ejection fraction: 65-70% (Date: 3/22) -HF type: Diastolic -NYHA Class: II (slight limitation of activity) -AHA HF Stage: A (HF risk factors present) -Current treatment: Bumetanide 1 mg daily - Appropriate, Effective, Safe, Accessible -Medications previously tried: furosemide  -Current home BP/HR readings: refer to above -Current dietary habits: doesn't use any salt and uses Mrs. Dash; only eats home cooking and loves vegetables (usually fresh); roasted vegetables; seafood and some beef or steak and doesn't -Current exercise habits: minimal with pain; daggers in her back when walking or standing -Educated on Importance of weighing daily; if you gain more than 3 pounds in one day or 5 pounds in one week, call cardiology. Proper diuretic administration and potassium supplementation Importance of blood pressure control -Counseled on diet and exercise extensively Recommended to continue current medication  COPD (Goal: control symptoms and prevent exacerbations) -Controlled -Current treatment  [name] 100-62.5-25 mcg/act 1 puff daily - Appropriate, Effective, Safe, Query accessible Albuterol as needed - Appropriate, Effective, Safe, Accessible -Medications previously tried: Advair (doesn't care for diskus)  -Medications previously tried: none  -Gold Grade: Gold 2 (FEV1 50-79%) -Current COPD Classification:  B (high sx, <2 exacerbations/yr) -MMRC/CAT score: n/a -Pulmonary function testing: 04/28/21 -Exacerbations requiring treatment in last 6 months: none -Patient reports consistent use of maintenance inhaler -Frequency of rescue inhaler use: not often (using oxygen constantly) -Counseled on Proper inhaler technique; Benefits of consistent maintenance inhaler use When to use rescue inhaler Differences between maintenance and rescue inhalers -Recommended to continue current  medication  Depression/Anxiety/Insomnia (Goal: minimize symptoms and improve sleep) -Controlled -Current treatment: Duloxetine 60 mg 1 capsule daily - Appropriate, Effective, Safe, Accessible Diazepam 5 mg 1 tablet as needed (uses sparingly) - Appropriate, Effective, Safe, Accessible Amitriptyline 75 mg 1 tablet daily at bedtime - Appropriate, Effective, Safe, Accessible -Medications previously tried/failed: Seroquel (sleep) -PHQ9: 0 -GAD7: n/a -Educated on Benefits of medication for symptom control Benefits of cognitive-behavioral therapy with or without medication -Recommended to continue current medication Recommended for patient to think about counseling.  Osteopenia (Goal prevent fractures) -Controlled -Last DEXA Scan: 11/2016   T-Score femoral neck: -2.0  T-Score total hip: n/a  T-Score lumbar spine: 0.9  T-Score forearm radius: n/a  10-year probability of major osteoporotic fracture: n/a  10-year probability of hip fracture: n/a -Patient is not a candidate for pharmacologic treatment -Current treatment  Vitamin D 50 mcg (2000 units daily) - Appropriate, Effective, Safe, Accessible -Medications previously tried: none  -Recommend [phone] units of vitamin D daily. Recommend 1200 mg of calcium daily from dietary and supplemental sources. Recommend weight-bearing and muscle strengthening exercises for building and maintaining bone density. -Recommended to continue current medication Patient is getting adequate calcium from dietary intake.  Hypothyroidism (Goal: TSH 2.5-4.5) -Not ideally controlled -Current treatment  Levothyroxine 75 mcg 1 tablet daily before breakfast - Appropriate, Effective, Query Safe, Accessible -Medications previously tried: none  -  Consider decreasing levothyroxine dose given concurrent osteopenia.  Gout (Goal: uric acid < 6 and prevent flare ups) -Not ideally controlled -Current treatment  Allopurinol 300 mg 1 tablet daily - Appropriate, Query  effective, Safe, Accessible Colchicine as needed - Appropriate, Effective, Safe, Accessible -Medications previously tried: none  -Recommended repeat uric acid level and consider increasing.  Pain/muscle spasms (Goal: minimize pain/muscle spasms) -Controlled -Current treatment  Hydrocodone-PAP 5-325 mg 1 tablet twice daily as needed (taking sparingly) - Appropriate, Effective, Safe, Accessible Tylenol 650 mg 1 tablet as needed - Appropriate, Effective, Safe, Accessible Diclofenac gel as needed (at least once a day) - Appropriate, Effective, Safe, Accessible Baclofen 10 mg 1 tablet twice daily as needed -Appropriate, Effective, Safe, Accessible -Medications previously tried: Advil (doesn't use due to hyponatremia)  -Recommended to continue current medication  Restless legs syndrome (Goal: minimize symptoms) -Not ideally controlled -Current treatment  Ropinirole 1 mg 1 tablet daily - Appropriate, Query effective, Safe, Accessible -Medications previously tried: none  -Recommended to continue current medication  Peripheral neuropathy (Goal: minimize symptoms) -Controlled -Current treatment  Topamax 25 mg 1 tablet at bedtime - Appropriate, Effective, Safe, Accessible Duloxetine 60 mg 1 capsule daily - Appropriate, Effective, Safe, Accessible -Medications previously tried: none  -Recommended to continue current medication   Health Maintenance -Vaccine gaps: COVID booster -Current therapy:  Magnesium gluconate 500 mg daily Docusate 100 mg 1 tablet twice daily Cephalexin 500 mg 1 capsule three times daily Vitamin B12 1000 mcg 1 tablet daily Probiotic daily Miralax as needed Systane as needed Biotin 3 mg daily (helps with hair) Benadryl as needed -Educated on Cost vs benefit of each product must be carefully weighed by individual consumer Supplements may interfere with prescription drugs -Patient is satisfied with current therapy and denies issues -Recommended switching Benadryl  to cetirizine or loratadine due to less sedation.  Patient Goals/Self-Care Activities Patient will:  - take medications as prescribed as evidenced by patient report and record review  Follow Up Plan: Telephone follow up appointment with care management team member scheduled for: 3 months      Medication Assistance:  Ozempic obtained through [employer] medication assistance program.  Enrollment ends 04/17/22  Compliance/Adherence/Medication fill history: Care Gaps: Urine microalbumin, COVID booster, eye exam, foot exam Last A1c: 6.6% (08/13/21) BP- 142/85 10/28/21  Star-Rating Drugs: Atorvastatin 20 mg - Last filled 08/10/21 90 DS at [place] - PAP  Patient's preferred pharmacy is:  [facility] [place] [place] [address] Phone: [phone] Fax: [phone]  Uses pill box? Yes - 4 weeks  Pt endorses 95% compliance  We discussed: Benefits of medication synchronization, packaging and delivery as well as enhanced pharmacist oversight with [employer]. Patient decided to: Continue current medication management strategy  Care Plan and Follow Up Patient Decision:  Patient agrees to Care Plan and Follow-up.  Plan: Telephone follow up appointment with care management team member scheduled for:  3 months  Jeni Salles, PharmD, [place] [phone]

## 2021-11-08 DIAGNOSIS — H35373 Puckering of macula, bilateral: Secondary | ICD-10-CM | POA: Diagnosis not present

## 2021-11-08 DIAGNOSIS — H04123 Dry eye syndrome of bilateral lacrimal glands: Secondary | ICD-10-CM | POA: Diagnosis not present

## 2021-11-08 LAB — HM DIABETES EYE EXAM

## 2021-11-09 ENCOUNTER — Encounter: Payer: Self-pay | Admitting: Internal Medicine

## 2021-11-09 ENCOUNTER — Other Ambulatory Visit: Payer: Self-pay

## 2021-11-09 ENCOUNTER — Ambulatory Visit (INDEPENDENT_AMBULATORY_CARE_PROVIDER_SITE_OTHER): Payer: PPO | Admitting: Internal Medicine

## 2021-11-09 DIAGNOSIS — M4626 Osteomyelitis of vertebra, lumbar region: Secondary | ICD-10-CM | POA: Diagnosis not present

## 2021-11-09 NOTE — Progress Notes (Signed)
Virtual Visit via Telephone Note  I connected with Brandi Stephens on 11/09/21 at  2:45 PM EDT by telephone and verified that I am speaking with the correct person using two identifiers.  Location: Patient: Home  Provider: [facility]   I discussed the limitations, risks, security and privacy concerns of performing an evaluation and management service by telephone and the availability of in person appointments. I also discussed with the patient that there may be a patient responsible charge related to this service. The patient expressed understanding and agreed to proceed.   History of Present Illness: I called and spoke with Brandi Stephens today.  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  She has been on chronic suppressive antibiotic therapy ever since.  At her last visit I changed her to cefadroxil.  She says that she is tolerating it well.  She was recently started on weekly injections of Ozempic and has lost 22 pounds.  She is getting more exercise recently.  She says that her chronic back pain is unchanged.   Observations/Objective:   Assessment and Plan: She prefers to stay on chronic, suppressive antibiotic therapy.  Follow Up Instructions: Continue cefadroxil Follow-up in 6 months   I discussed the assessment and treatment plan with the patient. The patient was provided an opportunity to ask questions and all were answered. The patient agreed with the plan and demonstrated an understanding of the instructions.   The patient was advised to call back or seek an in-person evaluation if the symptoms worsen or if the condition fails to improve as anticipated.  I provided 15 minutes of non-face-to-face time during this encounter.   [name], MD

## 2021-11-10 ENCOUNTER — Encounter: Payer: Self-pay | Admitting: Medical

## 2021-11-12 ENCOUNTER — Telehealth: Payer: Self-pay | Admitting: Adult Health

## 2021-11-12 ENCOUNTER — Telehealth: Payer: Self-pay | Admitting: Medical

## 2021-11-12 DIAGNOSIS — T1502XD Foreign body in cornea, left eye, subsequent encounter: Secondary | ICD-10-CM | POA: Diagnosis not present

## 2021-11-12 NOTE — Telephone Encounter (Signed)
Pt called stating her continuous glucose monitor was alarming due to low blood sugar of 72, last time injected Ozempic was Wednesday.  She hadn't eaten in a while had eye doctor appointment, while I was discussing with [name], pt ate some blueberry filling and her BS increased to 127 & it stopped alarming.  She had calmed down and so I Informed patient to always have a snack with her incase her BS drops and call back if needs anything else.

## 2021-11-12 NOTE — Telephone Encounter (Signed)
.  Called pt per 7/28 inbasket , Patient was unavailable, a message with appt time and date was left with number on file.

## 2021-11-15 ENCOUNTER — Telehealth: Payer: Self-pay | Admitting: Medical

## 2021-11-15 ENCOUNTER — Other Ambulatory Visit: Payer: Self-pay | Admitting: Medical

## 2021-11-15 DIAGNOSIS — Z9012 Acquired absence of left breast and nipple: Secondary | ICD-10-CM | POA: Diagnosis not present

## 2021-11-15 DIAGNOSIS — Z853 Personal history of malignant neoplasm of breast: Secondary | ICD-10-CM | POA: Diagnosis not present

## 2021-11-15 DIAGNOSIS — E118 Type 2 diabetes mellitus with unspecified complications: Secondary | ICD-10-CM | POA: Diagnosis not present

## 2021-11-15 DIAGNOSIS — N1831 Chronic kidney disease, stage 3a: Secondary | ICD-10-CM | POA: Diagnosis not present

## 2021-11-15 DIAGNOSIS — I8390 Asymptomatic varicose veins of unspecified lower extremity: Secondary | ICD-10-CM | POA: Diagnosis not present

## 2021-11-15 DIAGNOSIS — I509 Heart failure, unspecified: Secondary | ICD-10-CM | POA: Diagnosis not present

## 2021-11-15 DIAGNOSIS — I700 Atherosclerosis of aorta: Secondary | ICD-10-CM | POA: Diagnosis not present

## 2021-11-15 DIAGNOSIS — Z6837 Body mass index (BMI) 37.0-37.9, adult: Secondary | ICD-10-CM | POA: Diagnosis not present

## 2021-11-15 DIAGNOSIS — E119 Type 2 diabetes mellitus without complications: Secondary | ICD-10-CM | POA: Diagnosis not present

## 2021-11-15 DIAGNOSIS — S0502XA Injury of conjunctiva and corneal abrasion without foreign body, left eye, initial encounter: Secondary | ICD-10-CM | POA: Diagnosis not present

## 2021-11-15 MED ORDER — ONDANSETRON HCL 4 MG PO TABS: 4.0000 mg | ORAL_TABLET | Freq: Three times a day (TID) | ORAL | 0 refills | Status: DC | PRN

## 2021-11-15 NOTE — Telephone Encounter (Signed)
Pt called and is requesting zofran states that the ozempic is making her sick to her stomach,  Pt uses  [place] [place]

## 2021-11-16 ENCOUNTER — Telehealth: Payer: Self-pay | Admitting: Medical

## 2021-11-16 NOTE — Telephone Encounter (Signed)
P.A. ONDANSETRON 

## 2021-11-19 ENCOUNTER — Encounter: Payer: Self-pay | Admitting: Medical

## 2021-11-19 ENCOUNTER — Telehealth (INDEPENDENT_AMBULATORY_CARE_PROVIDER_SITE_OTHER): Payer: PPO | Admitting: Medical

## 2021-11-19 VITALS — BP 98/60 | HR 83 | Wt 248.0 lb

## 2021-11-19 DIAGNOSIS — Z87891 Personal history of nicotine dependence: Secondary | ICD-10-CM

## 2021-11-19 DIAGNOSIS — J431 Panlobular emphysema: Secondary | ICD-10-CM

## 2021-11-19 DIAGNOSIS — E1169 Type 2 diabetes mellitus with other specified complication: Secondary | ICD-10-CM

## 2021-11-19 DIAGNOSIS — E118 Type 2 diabetes mellitus with unspecified complications: Secondary | ICD-10-CM | POA: Diagnosis not present

## 2021-11-19 DIAGNOSIS — I5189 Other ill-defined heart diseases: Secondary | ICD-10-CM

## 2021-11-19 DIAGNOSIS — N1831 Chronic kidney disease, stage 3a: Secondary | ICD-10-CM | POA: Diagnosis not present

## 2021-11-19 DIAGNOSIS — R0902 Hypoxemia: Secondary | ICD-10-CM

## 2021-11-19 DIAGNOSIS — M1000 Idiopathic gout, unspecified site: Secondary | ICD-10-CM | POA: Diagnosis not present

## 2021-11-19 DIAGNOSIS — I5032 Chronic diastolic (congestive) heart failure: Secondary | ICD-10-CM | POA: Diagnosis not present

## 2021-11-19 DIAGNOSIS — E119 Type 2 diabetes mellitus without complications: Secondary | ICD-10-CM | POA: Insufficient documentation

## 2021-11-19 DIAGNOSIS — E785 Hyperlipidemia, unspecified: Secondary | ICD-10-CM | POA: Diagnosis not present

## 2021-11-19 MED ORDER — TRELEGY ELLIPTA 100-62.5-25 MCG/ACT IN AEPB
1.0000 | INHALATION_SPRAY | Freq: Every day | RESPIRATORY_TRACT | 3 refills | Status: DC
Start: 2021-11-19 — End: 2021-12-16

## 2021-11-19 MED ORDER — ROPINIROLE HCL 1 MG PO TABS: ORAL_TABLET | ORAL | 0 refills | Status: DC

## 2021-11-19 NOTE — Progress Notes (Signed)
This visit type was conducted due to national recommendations for restrictions regarding the COVID-19 Pandemic (e.g. social distancing) in an effort to limit this patient's exposure and mitigate transmission in our community.  Due to their co-morbid illnesses, this patient is at least at moderate risk for complications without adequate follow up.  This format is felt to be most appropriate for this patient at this time.    Documentation for virtual audio and video telecommunications through [place] encounter:  The patient was located at home. The provider was located in the office. The patient did consent to this visit and is aware of possible charges through their insurance for this visit.  The other persons participating in this telemedicine service were none. Time spent on call was 30 minutes and in review of previous records >40 minutes total.  This virtual service is not related to other E/M service within previous 7 days.   Subjective:  Brandi Stephens is a 76 y.o. female who presents for Chief Complaint  Patient presents with   med check    Med check- no concerns      Patient Care Team: Avarie Tavano, Camelia Eng, PA-C as PCP - General (Family Medicine) Debara Pickett Nadean Corwin, MD as PCP - Cardiology (Cardiology) Magrinat, Virgie Dad, MD as Consulting Physician (Oncology) Stark Klein, MD as Consulting Physician (General Surgery) [name], MD as Consulting Physician (Family Medicine) [name], MD as Consulting Physician (Neurosurgery) Griselda Miner, MD as Consulting Physician (Dermatology) Delice Bison, Charlestine Massed, NP as Nurse Practitioner (Hematology and Oncology) Marchia Bond, MD as Consulting Physician (Orthopedic Surgery) Clydell Hakim, MD (Inactive) as Consulting Physician (Anesthesiology) Armbruster, Carlota Raspberry, MD as Consulting Physician (Gastroenterology) [name], MD as Consulting Physician (Neurosurgery) Marcial Pacas, MD as Consulting Physician  (Neurology) Viona Gilmore, [facility] as Pharmacist (Pharmacist)   Virtual consult for med check.  Diabetes and obesity- Was on Trulicity initially in [place], but switched to plan for Ozempic.  Was able to start Ozempic about 9 weeks ago.  Was having more nausea initially, but now just intermittent.  But gets nauseated easily in general.  Just saw eye doctor and had diabetic eye exam a week ago.   They found a hole in her cornea.  Seems them again next week.  She sees [facility] ophthalmology  Dyslipidemia-she continues on atorvastatin.  She added OTC fish oil since last visit since her triglycerides are high.  Insurance would not approve prescription fish oil/Krill oil  History of gout-compliant with allopurinol.  No recent gout issues.  Only 2 flareups in the last 3 years.  She is compliant with [name] and needs a refill.  She uses her rescue inhaler as needed.  She would like to get off oxygen.  She is currently on 2 L at night and some during the day.  When she tried to come off the oxygen her pulse ox stays around 91% as opposed to the goal being over the 93%.  She has talked with her cardiologist and pulmonologist about the oxygen and breathing and she is little frustrated that there is no better option to get her off oxygen  No other aggravating or relieving factors.    No other c/o.  Past Medical History:  Diagnosis Date   AIN (anal intraepithelial neoplasia) anal canal    Anemia    with pregnancy   Anxiety    Arthritis    knees, lower back, knees   Chronic constipation    Chronic diastolic CHF (congestive heart failure) ([place]) 07/05/2019  CKD (chronic kidney disease)    Coarse tremors    Essential   COPD (chronic obstructive pulmonary disease) ([place])    2017 chest CT, history of   Degenerative lumbar spinal stenosis    Depression    Depression 07/05/2019   Drug dependency ([place])    Elevated PTHrP level 05/09/2019   ETOH abuse 07/05/2019   GERD (gastroesophageal reflux  disease)    pt denies   Gout    Grade I diastolic dysfunction 59/29/2446   Noted ECHO   Hemorrhoids    History of adenomatous polyp of colon    History of basal cell carcinoma (BCC) excision 2015   nose   History of sepsis 05/14/2014   post lumbar surgery   History of shingles    x2   Hyperlipidemia    Hypertension    Hypothyroidism    Irregular heart beat    history of while undergoing radiation   Malignant neoplasm of upper-inner quadrant of left breast in female, estrogen receptor positive [facility]) oncologist-  dr Jana Hakim--  per lov in epic no recurrence   dx 07-13-2015  Left breast invasive ductal carcinoma, Stage IA, Grade 1 (TXN0),  09-16-2015  s/p  left breast lumpectomy with sln bx's,  completed radiation 11-18-2015,  started antiestrogen therapy 12-14-2015   Neuropathy    Left foot and back of left leg   Obesity (BMI 30-39.9) 07/05/2019   Personal history of radiation therapy    completed 11-18-2015  left breast   Pneumonia    PONV (postoperative nausea and vomiting)    Prediabetes    diet controlled, no med   Restless leg syndrome    Seasonal allergies    Seizure ([place]) 05/2015   due to sepsis, only one time episode   Tremor    Wears partial dentures    lower    Current Outpatient Medications on File Prior to Visit  Medication Sig Dispense Refill   allopurinol (ZYLOPRIM) 300 MG tablet Take 1 tablet (300 mg total) by mouth daily. 1 daily 90 tablet 3   amitriptyline (ELAVIL) 100 MG tablet Take 1 tablet (100 mg total) by mouth at bedtime. 90 tablet 3   atorvastatin (LIPITOR) 80 MG tablet Take 1 tablet (80 mg total) by mouth daily. 90 tablet 3   betamethasone dipropionate 0.05 % cream Apply 1 application topically daily as needed (irritated skin).     Biotin 3 MG TABS Take 3 mg by mouth daily.     blood glucose meter kit and supplies KIT Dispense based on patient and insurance preference. Use up to four times daily as directed. 1 each 0   bumetanide (BUMEX) 1 MG tablet  Take 1 mg by mouth daily.     cefadroxil (DURICEF) 500 MG capsule Take 1 capsule (500 mg total) by mouth 2 (two) times daily. 180 capsule 3   Cholecalciferol (VITAMIN D3) 50 MCG (2000 UT) capsule Take 1 capsule (2,000 Units total) by mouth daily. 90 capsule 3   colchicine 0.6 MG tablet Take 2 tablets at once then take 1 tablet an hour later then then 1 tablet twice daily x 3 days. 30 tablet 2   diazepam (VALIUM) 10 MG tablet Take 1 tablet (10 mg total) by mouth every 6 (six) hours as needed for anxiety. 30 tablet 0   DULoxetine (CYMBALTA) 60 MG capsule TAKE 1 CAPSULE(60 MG) BY MOUTH DAILY 90 capsule 0   HYDROcodone-acetaminophen (NORCO/VICODIN) 5-325 MG tablet Take 1 tablet by mouth every 4 (four) hours as  needed for moderate pain. 20 tablet 0   levothyroxine (SYNTHROID) 75 MCG tablet TAKE 1 TABLET(75 MCG) BY MOUTH DAILY BEFORE BREAKFAST 90 tablet 1   Magnesium Gluconate 250 MG TABS Take 1 tablet by mouth daily.     metoprolol tartrate (LOPRESSOR) 25 MG tablet Take 1 tablet (25 mg total) by mouth 2 (two) times daily. 180 tablet 3   ondansetron (ZOFRAN) 4 MG tablet Take 1 tablet (4 mg total) by mouth every 8 (eight) hours as needed for nausea or vomiting. 20 tablet 0   polyethylene glycol powder (GLYCOLAX/MIRALAX) 17 GM/SCOOP powder Take 17 g by mouth daily as needed for moderate constipation.     primidone (MYSOLINE) 50 MG tablet TAKE 1 TABLET BY MOUTH TWICE DAILY 180 tablet 3   Probiotic Product (PROBIOTIC DAILY PO) Take 1 tablet by mouth daily.     Propylene Glycol (SYSTANE BALANCE OP) Place 1 drop into both eyes 4 (four) times daily as needed (dry eyes).     Semaglutide,0.25 or 0.5MG/DOS, (OZEMPIC, 0.25 OR 0.5 MG/DOSE,) 2 MG/1.5ML SOPN Inject 0.5 mg into the skin once a week.     topiramate (TOPAMAX) 50 MG tablet Take 1 tablet (50 mg total) by mouth at bedtime. 90 tablet 3   Continuous Blood Gluc Sensor (FREESTYLE LIBRE 3 SENSOR) MISC 1 each by Does not apply route daily. Place 1 sensor on the  skin every 14 days. Use to check glucose continuously 2 each 11   miconazole (MICATIN) 2 % cream Apply 1 Application topically 2 (two) times daily. 28.35 g 12   OXYGEN Inhale 2 L into the lungs.     No current facility-administered medications on file prior to visit.     The following portions of the patient's history were reviewed and updated as appropriate: allergies, current medications, past family history, past medical history, past social history, past surgical history and problem list.  ROS Otherwise as in subjective above  Objective: BP 98/60   Pulse 83   Wt 248 lb (112.5 kg)   SpO2 98%   BMI 37.71 kg/m    General appearance: alert, no distress, well developed, well nourished Seated with nasal cannula oxygen in place Not examined as this was a virtual consult   Assessment: Encounter Diagnoses  Name Primary?   Type II diabetes mellitus with complication (HCC) Yes   Panlobular emphysema ([place])    Stage 3a chronic kidney disease ([place])    Former cigarette smoker    Idiopathic gout, unspecified chronicity, unspecified site    Grade I diastolic dysfunction    Chronic diastolic CHF (congestive heart failure) (HCC)    Hypoxemia    Dyslipidemia associated with type 2 diabetes mellitus ([place])      Plan: I reviewed her chart record, medications and we discussed several of her chronic issues as below.  I reviewed her recent chronic care management notes from pharmacist and their recommendations.  Obesity and diabetes I congratulated her on her weight loss so far.  She will continue on Ozempic.  She does not want to increase the dose yet as she has several weeks of medication at the  0.5 mg dose at home already.  Continue working on healthy eating habits and getting some exercise where possible  Diabetes-compliant with Ozempic with good results and weight and appetite control.  Follow-up in the near future for some baseline labs as below  COPD-continue current medications.   She feels like her current regimen is working relatively well but wants to get  off oxygen.  I advise she follow-up with pulmonology soon  History of heart failure and abnormal diastolic dysfunction-follow-up with cardiology, continue current medications  History of gout-she notes only about 2 flareups in the past year.  Rarely has to use colchicine.  Compliant with allopurinol.  Uric acid was 6 in April 2023.  Continue allopurinol 300 mg daily, colchicine as needed  Dyslipidemia associated with diabetes -return soon for fasting labs since adding over-the-counter Krill oil to her atorvastatin.   [place] was seen today for med check.  Diagnoses and all orders for this visit:  Type II diabetes mellitus with complication (HCC) -     Microalbumin/Creatinine Ratio, Urine; Future -     Hemoglobin A1c; Future  Panlobular emphysema ([place])  Stage 3a chronic kidney disease (HCC)  Former cigarette smoker  Idiopathic gout, unspecified chronicity, unspecified site  Grade I diastolic dysfunction  Chronic diastolic CHF (congestive heart failure) (HCC)  Hypoxemia  Dyslipidemia associated with type 2 diabetes mellitus ([place]) -     Lipid panel; Future  Other orders -     Fluticasone-Umeclidin-Vilant ([name] ELLIPTA) 100-62.5-25 MCG/ACT AEPB; Inhale 1 puff into the lungs daily. -     rOPINIRole (REQUIP) 1 MG tablet; TAKE 1 TABLET(1 MG) BY MOUTH AT BEDTIME    Follow up: In the near future for fasting labs

## 2021-11-25 ENCOUNTER — Telehealth: Payer: Self-pay | Admitting: Pharmacist

## 2021-11-25 DIAGNOSIS — H1789 Other corneal scars and opacities: Secondary | ICD-10-CM | POA: Diagnosis not present

## 2021-11-25 NOTE — Telephone Encounter (Signed)
Patient called me about the Ozempic as she wants to increase her dose and wasn't sure what to do with her current pens. Patient and PCP just discussed increasing the dose at last visit and patient preferred to leave it as is but has since changed her mind. Filled out new prescription to be signed and faxed by PCP for 1 mg dose. Routing to PCP to make him aware.  Patient also inquired about assistance for [name]. Provided 1 month's worth of samples and printed out patient assistance paperwork for patient to sign and bring back to office with proof of out of pocket spend. Patient's husband will pick up today or tomorrow.

## 2021-11-26 NOTE — Telephone Encounter (Signed)
P.A. denied, sent pt mychart message

## 2021-11-29 NOTE — Telephone Encounter (Signed)
I have given you form back to you per maddie's note

## 2021-11-29 NOTE — Telephone Encounter (Signed)
Pt Assitance Ozempic dosage increase form faxed to [employer]

## 2021-12-02 ENCOUNTER — Telehealth: Payer: Self-pay | Admitting: Medical

## 2021-12-02 NOTE — Telephone Encounter (Signed)
Called patient back. She is aware to go ahead and inject 2 x 0.5 mg of the Ozempic next Wednesday and that the 1 mg dose was sent to the patient assistance program. Once she finishes on the starter pens, she will go to the 1 mg pen.

## 2021-12-02 NOTE — Telephone Encounter (Signed)
Pt called for Brandi Stephens. She states that Brandi Stephens was to help her with one of her meds. She states that she has figured her medication out but would like Brandi Stephens to call her. Pt can be reached at [phone].

## 2021-12-05 DIAGNOSIS — J449 Chronic obstructive pulmonary disease, unspecified: Secondary | ICD-10-CM | POA: Diagnosis not present

## 2021-12-13 DIAGNOSIS — H04123 Dry eye syndrome of bilateral lacrimal glands: Secondary | ICD-10-CM | POA: Diagnosis not present

## 2021-12-13 DIAGNOSIS — H1789 Other corneal scars and opacities: Secondary | ICD-10-CM | POA: Diagnosis not present

## 2021-12-16 ENCOUNTER — Other Ambulatory Visit: Payer: Self-pay | Admitting: Medical

## 2021-12-16 MED ORDER — TRELEGY ELLIPTA 100-62.5-25 MCG/ACT IN AEPB
1.0000 | INHALATION_SPRAY | Freq: Every day | RESPIRATORY_TRACT | 1 refills | Status: DC
Start: 2021-12-16 — End: 2022-04-21

## 2021-12-17 ENCOUNTER — Ambulatory Visit
Admission: RE | Admit: 2021-12-17 | Discharge: 2021-12-17 | Disposition: A | Discharge status: Home / Self Care | Payer: PPO | Source: Ambulatory Visit | Attending: Adult Health | Admitting: Adult Health

## 2021-12-17 DIAGNOSIS — Z853 Personal history of malignant neoplasm of breast: Secondary | ICD-10-CM | POA: Diagnosis not present

## 2021-12-17 DIAGNOSIS — Z170 Estrogen receptor positive status [ER+]: Secondary | ICD-10-CM

## 2021-12-21 ENCOUNTER — Telehealth: Payer: Self-pay

## 2021-12-21 NOTE — Telephone Encounter (Signed)
S/w pt regarding MM results. She states at this time she does not think she needs a br MRI but that if she notices any changes regarding size, palpating, dimpling or other irregularities she will call us for an MRI.

## 2021-12-21 NOTE — Telephone Encounter (Signed)
-----   Message from Gardenia Phlegm, NP sent at 12/18/2021 12:59 PM EDT ----- See if patient wants breast MRI for her breast asymmetry.  I am happy to order.  If she wants to talk about it, set up phone visit  ----- Message ----- From: Interface, Rad Results In Sent: 12/17/2021   6:24 PM EDT To: Gardenia Phlegm, NP

## 2021-12-22 ENCOUNTER — Encounter: Payer: Self-pay | Admitting: Internal Medicine

## 2021-12-24 ENCOUNTER — Telehealth: Payer: Self-pay | Admitting: Medical

## 2021-12-24 NOTE — Telephone Encounter (Signed)
I will fax this back to [employer] to send to pulmonology

## 2021-12-24 NOTE — Telephone Encounter (Signed)
I received a form from [place] about oxygen.  This should be coming from pulmonology.  Please make sure pulmonology has this form as they need to complete this for her oxygen

## 2021-12-27 ENCOUNTER — Telehealth: Payer: Self-pay | Admitting: Pharmacist

## 2021-12-27 NOTE — Progress Notes (Signed)
A user error has taken place: encounter opened in error, closed for administrative reasons.

## 2021-12-27 NOTE — Progress Notes (Signed)
Chronic Care Management Pharmacy Assistant    Name: Brandi Stephens  MRN: [id number] DOB: Oct 07, 1945  Reason for Encounter: [name] Patient Assistance Follow up   Recent office visits:  11/19/21 Tysinger, Camelia Eng, PA-C - Patient presented via video for Type 2 diabetes mellitus with complication and other concerns. No medication changes.  Recent consult visits:  11/09/21 [name], MD - Patient presented for Osteomyelitis of lumbar spine. No medication changes.  Hospital visits:  None in previous 6 months  Medications: Outpatient Encounter Medications as of 12/27/2021  Medication Sig   allopurinol (ZYLOPRIM) 300 MG tablet Take 1 tablet (300 mg total) by mouth daily. 1 daily   amitriptyline (ELAVIL) 100 MG tablet Take 1 tablet (100 mg total) by mouth at bedtime.   atorvastatin (LIPITOR) 80 MG tablet Take 1 tablet (80 mg total) by mouth daily.   betamethasone dipropionate 0.05 % cream Apply 1 application topically daily as needed (irritated skin).   Biotin 3 MG TABS Take 3 mg by mouth daily.   blood glucose meter kit and supplies KIT Dispense based on patient and insurance preference. Use up to four times daily as directed.   bumetanide (BUMEX) 1 MG tablet Take 1 mg by mouth daily.   cefadroxil (DURICEF) 500 MG capsule Take 1 capsule (500 mg total) by mouth 2 (two) times daily.   Cholecalciferol (VITAMIN D3) 50 MCG (2000 UT) capsule Take 1 capsule (2,000 Units total) by mouth daily.   colchicine 0.6 MG tablet Take 2 tablets at once then take 1 tablet an hour later then then 1 tablet twice daily x 3 days.   Continuous Blood Gluc Sensor (FREESTYLE LIBRE 3 SENSOR) MISC 1 each by Does not apply route daily. Place 1 sensor on the skin every 14 days. Use to check glucose continuously   diazepam (VALIUM) 10 MG tablet Take 1 tablet (10 mg total) by mouth every 6 (six) hours as needed for anxiety.   DULoxetine (CYMBALTA) 60 MG capsule TAKE 1 CAPSULE(60 MG) BY MOUTH DAILY    Fluticasone-Umeclidin-Vilant ([name] ELLIPTA) 100-62.5-25 MCG/ACT AEPB Inhale 1 puff into the lungs daily.   HYDROcodone-acetaminophen (NORCO/VICODIN) 5-325 MG tablet Take 1 tablet by mouth every 4 (four) hours as needed for moderate pain.   levothyroxine (SYNTHROID) 75 MCG tablet TAKE 1 TABLET(75 MCG) BY MOUTH DAILY BEFORE BREAKFAST   Magnesium Gluconate 250 MG TABS Take 1 tablet by mouth daily.   metoprolol tartrate (LOPRESSOR) 25 MG tablet Take 1 tablet (25 mg total) by mouth 2 (two) times daily.   miconazole (MICATIN) 2 % cream Apply 1 Application topically 2 (two) times daily.   ondansetron (ZOFRAN) 4 MG tablet Take 1 tablet (4 mg total) by mouth every 8 (eight) hours as needed for nausea or vomiting.   OXYGEN Inhale 2 L into the lungs.   polyethylene glycol powder (GLYCOLAX/MIRALAX) 17 GM/SCOOP powder Take 17 g by mouth daily as needed for moderate constipation.   primidone (MYSOLINE) 50 MG tablet TAKE 1 TABLET BY MOUTH TWICE DAILY   Probiotic Product (PROBIOTIC DAILY PO) Take 1 tablet by mouth daily.   Propylene Glycol (SYSTANE BALANCE OP) Place 1 drop into both eyes 4 (four) times daily as needed (dry eyes).   rOPINIRole (REQUIP) 1 MG tablet TAKE 1 TABLET(1 MG) BY MOUTH AT BEDTIME   Semaglutide,0.25 or 0.5MG/DOS, (OZEMPIC, 0.25 OR 0.5 MG/DOSE,) 2 MG/1.5ML SOPN Inject 0.5 mg into the skin once a week.   topiramate (TOPAMAX) 50 MG tablet Take 1 tablet (50 mg  total) by mouth at bedtime.   No facility-administered encounter medications on file as of 12/27/2021.   Notes: Call to [place] to check the status of the Patient Assistance Application of [name] that was submitted via fax by Jeni Salles on 12/16/21. Spoke to [facility] whom advised application was missing application to move forward. She reports that Pharmacy printout must include all pages and be signed by pharmacist if does not read patient paid as proof of charges. She further reports she needs a copy of patient's standard medicare card.  Call to patient to make her aware that we need another signed Pharmacy printout to submit with her application, did not reach left her a voicemail with my contact information for a return call.  Care Gaps: Foot Exam - Overdue Urine Micro - Overdue COVID Booster - Overdue Flu Vaccine - Overdue CCM- 10/23 BP- 142/85 10/28/21 AWV- 11/22 Lab Results  Component Value Date   HGBA1C 6.6 (H) 08/13/2021    Star Rating Drugs: Atorvastatin 20 mg - Last filled 11/13/21 90 DS at [place] Clinical Pharmacist Assistant [phone]

## 2021-12-31 ENCOUNTER — Telehealth: Payer: Self-pay | Admitting: Medical

## 2021-12-31 ENCOUNTER — Ambulatory Visit (INDEPENDENT_AMBULATORY_CARE_PROVIDER_SITE_OTHER): Payer: PPO | Admitting: Family Medicine

## 2021-12-31 VITALS — BP 100/60 | HR 105 | Temp 97.7°F

## 2021-12-31 DIAGNOSIS — E118 Type 2 diabetes mellitus with unspecified complications: Secondary | ICD-10-CM

## 2021-12-31 NOTE — Progress Notes (Signed)
   Subjective:    Patient ID: Brandi Stephens, female    DOB: Feb 09, 1946, 77 y.o.   MRN: [id number]  HPI She is here to discuss low blood sugar.  She is now on Ozempic 1 mg.  Apparently up until recently she has done fairly well but has had difficulty with nausea and low blood sugars.  She does have freestyle [place] which was downloaded.  It did show that she is keeping her sugars within the range but in the last several days has been hovering around the low normal and slightly below that range.  She has been nauseated but apparently is getting that under better control.  She does continue to lose weight.   Review of Systems     Objective:   Physical Exam Alert and in no distress.  She does appear slightly shaky and was given some food which did bring her blood sugar up to 86.       Assessment & Plan:  Type II diabetes mellitus with complication ([place]) After discussion with her recommend that she go back to 2.5 mg of the Ozempic as the higher dose with her eating habits is not really working well for her.  Encouraged her to continue with the more frequent but smaller meals and if her blood sugar does drop down below 70 , she should immediately eat something with sugar as well as protein and fats to get her sugar up and keep it there.  Hopefully dropping down to the point 5 mg will be enough to keep her out of trouble.

## 2021-12-31 NOTE — Telephone Encounter (Signed)
Please let her know I am not in the office until Monday and see if someone else needs to help her before then

## 2021-12-31 NOTE — Telephone Encounter (Signed)
Pt asks if you can please give her a call.

## 2022-01-03 DIAGNOSIS — H04123 Dry eye syndrome of bilateral lacrimal glands: Secondary | ICD-10-CM | POA: Diagnosis not present

## 2022-01-04 NOTE — Telephone Encounter (Signed)
Pt Assistance Ozempic received, left message for pt  

## 2022-01-05 ENCOUNTER — Ambulatory Visit (INDEPENDENT_AMBULATORY_CARE_PROVIDER_SITE_OTHER): Payer: PPO | Admitting: Medical

## 2022-01-05 VITALS — BP 124/80 | HR 107 | Wt 242.0 lb

## 2022-01-05 DIAGNOSIS — E785 Hyperlipidemia, unspecified: Secondary | ICD-10-CM | POA: Diagnosis not present

## 2022-01-05 DIAGNOSIS — E1169 Type 2 diabetes mellitus with other specified complication: Secondary | ICD-10-CM | POA: Diagnosis not present

## 2022-01-05 DIAGNOSIS — N1831 Chronic kidney disease, stage 3a: Secondary | ICD-10-CM | POA: Diagnosis not present

## 2022-01-05 DIAGNOSIS — E118 Type 2 diabetes mellitus with unspecified complications: Secondary | ICD-10-CM | POA: Diagnosis not present

## 2022-01-05 DIAGNOSIS — E038 Other specified hypothyroidism: Secondary | ICD-10-CM | POA: Diagnosis not present

## 2022-01-05 DIAGNOSIS — Z79899 Other long term (current) drug therapy: Secondary | ICD-10-CM | POA: Diagnosis not present

## 2022-01-05 DIAGNOSIS — Z23 Encounter for immunization: Secondary | ICD-10-CM

## 2022-01-05 DIAGNOSIS — J449 Chronic obstructive pulmonary disease, unspecified: Secondary | ICD-10-CM | POA: Diagnosis not present

## 2022-01-05 NOTE — Progress Notes (Unsigned)
Subjective:    Patient ID: Brandi Stephens, female    DOB: 30-Apr-1945, 76 y.o.   MRN: [id number]  HPI Here for f/u . She was here last week for some hypoglycemia.   She was only eating small amounts 2-3 times daily.   She has done better with getting adequate intake and avoiding low sugars since last visit.   (I reviewed her freestyle [place] records).   Before ozempic she was 280lb last year as of 02/2021.   She has gradually lost some weight up unit ozempic and last ost 25lb in the last few months on ozempic.  She wants to continue this.  She is up to 2x 0.99m injections or 138mnow.     Edema, HTN - on bumex one tablet daily  Uses milralax prn  Sees neurology for many of her medications for pain/neuropathy  No other new issues today   Past Medical History:  Diagnosis Date   AIN (anal intraepithelial neoplasia) anal canal    Anemia    with pregnancy   Anxiety    Arthritis    knees, lower back, knees   Chronic constipation    Chronic diastolic CHF (congestive heart failure) ([facility]3/19/2021   CKD (chronic kidney disease)    Coarse tremors    Essential   COPD (chronic obstructive pulmonary disease) ([facility]   2017 chest CT, history of   Degenerative lumbar spinal stenosis    Depression    Depression 07/05/2019   Drug dependency ([facility]   Elevated PTHrP level 05/09/2019   ETOH abuse 07/05/2019   GERD (gastroesophageal reflux disease)    pt denies   Gout    Grade I diastolic dysfunction 1012/45/8099 Noted ECHO   Hemorrhoids    History of adenomatous polyp of colon    History of basal cell carcinoma (BCC) excision 2015   nose   History of sepsis 05/14/2014   post lumbar surgery   History of shingles    x2   Hyperlipidemia    Hypertension    Hypothyroidism    Irregular heart beat    history of while undergoing radiation   Malignant neoplasm of upper-inner quadrant of left breast in female, estrogen receptor positive [facility]  dr maJana Hakim  per lov in epic no  recurrence   dx 07-13-2015  Left breast invasive ductal carcinoma, Stage IA, Grade 1 (TXN0),  09-16-2015  s/p  left breast lumpectomy with sln bx's,  completed radiation 11-18-2015,  started antiestrogen therapy 12-14-2015   Neuropathy    Left foot and back of left leg   Obesity (BMI 30-39.9) 07/05/2019   Personal history of radiation therapy    completed 11-18-2015  left breast   Pneumonia    PONV (postoperative nausea and vomiting)    Prediabetes    diet controlled, no med   Restless leg syndrome    Seasonal allergies    Seizure (HCM[facility]02/2017   due to sepsis, only one time episode   Tremor    Wears partial dentures    lower    Current Outpatient Medications on File Prior to Visit  Medication Sig Dispense Refill   allopurinol (ZYLOPRIM) 300 MG tablet Take 1 tablet (300 mg total) by mouth daily. 1 daily 90 tablet 3   amitriptyline (ELAVIL) 100 MG tablet Take 1 tablet (100 mg total) by mouth at bedtime. 90 tablet 3   atorvastatin (LIPITOR) 80 MG tablet Take 1 tablet (80 mg total) by mouth daily. 90[place]  tablet 3   betamethasone dipropionate 0.05 % cream Apply 1 application topically daily as needed (irritated skin).     Biotin 3 MG TABS Take 3 mg by mouth daily.     bumetanide (BUMEX) 1 MG tablet Take 1 mg by mouth daily.     Cholecalciferol (VITAMIN D3) 50 MCG (2000 UT) capsule Take 1 capsule (2,000 Units total) by mouth daily. 90 capsule 3   colchicine 0.6 MG tablet Take 2 tablets at once then take 1 tablet an hour later then then 1 tablet twice daily x 3 days. 30 tablet 2   Continuous Blood Gluc Sensor (FREESTYLE LIBRE 3 SENSOR) MISC 1 each by Does not apply route daily. Place 1 sensor on the skin every 14 days. Use to check glucose continuously 2 each 11   diazepam (VALIUM) 10 MG tablet Take 1 tablet (10 mg total) by mouth every 6 (six) hours as needed for anxiety. 30 tablet 0   DULoxetine (CYMBALTA) 60 MG capsule TAKE 1 CAPSULE(60 MG) BY MOUTH DAILY 90 capsule 0    Fluticasone-Umeclidin-Vilant ([name] ELLIPTA) 100-62.5-25 MCG/ACT AEPB Inhale 1 puff into the lungs daily. 90 each 1   HYDROcodone-acetaminophen (NORCO/VICODIN) 5-325 MG tablet Take 1 tablet by mouth every 4 (four) hours as needed for moderate pain. 20 tablet 0   levothyroxine (SYNTHROID) 75 MCG tablet TAKE 1 TABLET(75 MCG) BY MOUTH DAILY BEFORE BREAKFAST 90 tablet 1   Magnesium Gluconate 250 MG TABS Take 1 tablet by mouth daily.     metoprolol tartrate (LOPRESSOR) 25 MG tablet Take 1 tablet (25 mg total) by mouth 2 (two) times daily. 180 tablet 3   miconazole (MICATIN) 2 % cream Apply 1 Application topically 2 (two) times daily. 28.35 g 12   ondansetron (ZOFRAN) 4 MG tablet Take 1 tablet (4 mg total) by mouth every 8 (eight) hours as needed for nausea or vomiting. 20 tablet 0   primidone (MYSOLINE) 50 MG tablet TAKE 1 TABLET BY MOUTH TWICE DAILY 180 tablet 3   Probiotic Product (PROBIOTIC DAILY PO) Take 1 tablet by mouth daily.     Propylene Glycol (SYSTANE BALANCE OP) Place 1 drop into both eyes 4 (four) times daily as needed (dry eyes).     rOPINIRole (REQUIP) 1 MG tablet TAKE 1 TABLET(1 MG) BY MOUTH AT BEDTIME 90 tablet 0   Semaglutide,0.25 or 0.5MG/DOS, (OZEMPIC, 0.25 OR 0.5 MG/DOSE,) 2 MG/1.5ML SOPN Inject 1 mg into the skin once a week.     topiramate (TOPAMAX) 50 MG tablet Take 1 tablet (50 mg total) by mouth at bedtime. 90 tablet 3   blood glucose meter kit and supplies KIT Dispense based on patient and insurance preference. Use up to four times daily as directed. 1 each 0   cefadroxil (DURICEF) 500 MG capsule Take 1 capsule (500 mg total) by mouth 2 (two) times daily. 180 capsule 3   OXYGEN Inhale 2 L into the lungs.     polyethylene glycol powder (GLYCOLAX/MIRALAX) 17 GM/SCOOP powder Take 17 g by mouth daily as needed for moderate constipation.     No current facility-administered medications on file prior to visit.     Review of Systems As in subjective     Objective: BP  124/80   Pulse (!) 107   Wt 242 lb (109.8 kg)   BMI 36.80 kg/m   Gen: nad, seated in wheelchair on oxygen       Assessment & Plan:   Encounter Diagnoses  Name Primary?   Type II  diabetes mellitus with complication ([place]) Yes   Other specified hypothyroidism    Dyslipidemia associated with type 2 diabetes mellitus ([place])    Stage 3a chronic kidney disease ([place])    Needs flu shot    Need for pneumococcal vaccination    Polypharmacy    High risk medication use    Diabetes - we need to avoid hypoglycemia. Discussed ways to manage hypoglycemia, discussed avoidance of lows.   Labs today.  Continue for now on Ozempic 4m, but if any other lows, will have to stay at lower dose ozempic.   Counseled on diet, avoiding fasting.   Continue good water intake  Hypothyroidism - continue current mediation, labs today  Dyslipidemia - c/t statin  CKD 3 - labs today  I recommend she talk to her neurologist about trying to cut down on some medicaiton or try less burdensome regimen    Counseled on the influenza virus vaccine.  Vaccine information sheet given.   High dose Influenza vaccine given after consent obtained.  Counseled on the pneumococcal vaccine.  Vaccine information sheet given.  Pneumococcal vaccine Prevnar 20 given after consent obtained.   Daphanie was seen today for follow-up.  Diagnoses and all orders for this visit:  Type II diabetes mellitus with complication ([place] -     Hemoglobin A1c -     Microalbumin/Creatinine Ratio, Urine  Other specified hypothyroidism -     TSH + free T4  Dyslipidemia associated with type 2 diabetes mellitus (HCC)  Stage 3a chronic kidney disease (HCC) -     Comprehensive metabolic panel -     CBC  Needs flu shot -     Flu Vaccine QUAD High Dose([place])  Need for pneumococcal vaccination -     Pneumococcal conjugate vaccine 20-valent  Polypharmacy  High risk medication use  Other orders -     Cancel: Varicella-zoster vaccine IM -      Semaglutide, 1 MG/DOSE, 4 MG/3ML SOPN; Inject 1 mg as directed once a week.   F/u pending labs

## 2022-01-06 LAB — MICROALBUMIN / CREATININE URINE RATIO
Creatinine, Urine: 85.4 mg/dL
Microalb/Creat Ratio: 13 mg/g creat (ref 0–29)
Microalbumin, Urine: 11.5 ug/mL

## 2022-01-06 LAB — COMPREHENSIVE METABOLIC PANEL
ALT: 23 IU/L (ref 0–32)
AST: 25 IU/L (ref 0–40)
Albumin/Globulin Ratio: 2.1 (ref 1.2–2.2)
Albumin: 4.5 g/dL (ref 3.8–4.8)
Alkaline Phosphatase: 101 IU/L (ref 44–121)
BUN/Creatinine Ratio: 18 (ref 12–28)
BUN: 19 mg/dL (ref 8–27)
Bilirubin Total: 0.3 mg/dL (ref 0.0–1.2)
CO2: 25 mmol/L (ref 20–29)
Calcium: 9.8 mg/dL (ref 8.7–10.3)
Chloride: 100 mmol/L (ref 96–106)
Creatinine, Ser: 1.07 mg/dL — ABNORMAL HIGH (ref 0.57–1.00)
Globulin, Total: 2.1 g/dL (ref 1.5–4.5)
Glucose: 119 mg/dL — ABNORMAL HIGH (ref 70–99)
Potassium: 4.3 mmol/L (ref 3.5–5.2)
Sodium: 141 mmol/L (ref 134–144)
Total Protein: 6.6 g/dL (ref 6.0–8.5)
eGFR: 54 mL/min/{1.73_m2} — ABNORMAL LOW (ref >59)

## 2022-01-06 LAB — CBC
Hematocrit: 38.1 % (ref 34.0–46.6)
Hemoglobin: 12.7 g/dL (ref 11.1–15.9)
MCH: 32 pg (ref 26.6–33.0)
MCHC: 33.3 g/dL (ref 31.5–35.7)
MCV: 96 fL (ref 79–97)
Platelets: 274 10*3/uL (ref 150–450)
RBC: 3.97 x10E6/uL (ref 3.77–5.28)
RDW: 14.4 % (ref 11.7–15.4)
WBC: 10.8 10*3/uL (ref 3.4–10.8)

## 2022-01-06 LAB — HEMOGLOBIN A1C
Est. average glucose Bld gHb Est-mCnc: 128 mg/dL
Hgb A1c MFr Bld: 6.1 % — ABNORMAL HIGH (ref 4.8–5.6)

## 2022-01-06 LAB — TSH+FREE T4
Free T4: 1.23 ng/dL (ref 0.82–1.77)
TSH: 3.35 u[IU]/mL (ref 0.450–4.500)

## 2022-01-06 MED ORDER — SEMAGLUTIDE (1 MG/DOSE) 4 MG/3ML ~~LOC~~ SOPN: 1.0000 mg | PEN_INJECTOR | SUBCUTANEOUS | 2 refills | Status: DC

## 2022-01-06 NOTE — Telephone Encounter (Signed)
Called patient about patient assistance requirements for [name] still needed. Patient will need pharmacist at [facility] and a copy of her medicare A&B card with her application. Patient is aware and states her husband will come to the office to get the print out back to get signed and have the front desk scan her medicare A&B card into her profile.

## 2022-01-11 ENCOUNTER — Encounter: Payer: PPO | Admitting: Physical Medicine and Rehabilitation

## 2022-01-11 ENCOUNTER — Encounter: Payer: Self-pay | Admitting: Internal Medicine

## 2022-01-18 ENCOUNTER — Encounter: Payer: Self-pay | Admitting: Medical

## 2022-01-19 ENCOUNTER — Telehealth: Payer: Self-pay | Admitting: Pharmacist

## 2022-01-19 NOTE — Chronic Care Management (AMB) (Signed)
Chronic Care Management Pharmacy Assistant   Name: Brandi Stephens  MRN: [id number] DOB: 02/22/1946   01/19/22 APPOINTMENT REMINDER   Called Patient No answer, left message of appointment on 01/20/22 at 1:30 via telephone visit with Jeni Salles, Pharm D.   Notified to have all medications, supplements, blood pressure and/or blood sugar logs available during appointment and to return call if need to reschedule.    Care Gaps: Foot Exam - Overdue COVID Booster - Postponed BP- 124/80 01/05/22 Lab Results  Component Value Date   HGBA1C 6.1 (H) 01/05/2022    Star Rating Drug: Atorvastatin 20 mg - Last filled 11/13/21 90 DS at [facility]     Medications: Outpatient Encounter Medications as of 01/19/2022  Medication Sig   allopurinol (ZYLOPRIM) 300 MG tablet Take 1 tablet (300 mg total) by mouth daily. 1 daily   amitriptyline (ELAVIL) 100 MG tablet Take 1 tablet (100 mg total) by mouth at bedtime.   atorvastatin (LIPITOR) 80 MG tablet Take 1 tablet (80 mg total) by mouth daily.   betamethasone dipropionate 0.05 % cream Apply 1 application topically daily as needed (irritated skin).   Biotin 3 MG TABS Take 3 mg by mouth daily.   blood glucose meter kit and supplies KIT Dispense based on patient and insurance preference. Use up to four times daily as directed.   bumetanide (BUMEX) 1 MG tablet Take 1 mg by mouth daily.   cefadroxil (DURICEF) 500 MG capsule Take 1 capsule (500 mg total) by mouth 2 (two) times daily.   Cholecalciferol (VITAMIN D3) 50 MCG (2000 UT) capsule Take 1 capsule (2,000 Units total) by mouth daily.   colchicine 0.6 MG tablet Take 2 tablets at once then take 1 tablet an hour later then then 1 tablet twice daily x 3 days.   Continuous Blood Gluc Sensor (FREESTYLE LIBRE 3 SENSOR) MISC 1 each by Does not apply route daily. Place 1 sensor on the skin every 14 days. Use to check glucose continuously   diazepam (VALIUM) 10 MG tablet Take 1 tablet (10 mg  total) by mouth every 6 (six) hours as needed for anxiety.   DULoxetine (CYMBALTA) 60 MG capsule TAKE 1 CAPSULE(60 MG) BY MOUTH DAILY   Fluticasone-Umeclidin-Vilant ([name] ELLIPTA) 100-62.5-25 MCG/ACT AEPB Inhale 1 puff into the lungs daily.   HYDROcodone-acetaminophen (NORCO/VICODIN) 5-325 MG tablet Take 1 tablet by mouth every 4 (four) hours as needed for moderate pain.   levothyroxine (SYNTHROID) 75 MCG tablet TAKE 1 TABLET(75 MCG) BY MOUTH DAILY BEFORE BREAKFAST   Magnesium Gluconate 250 MG TABS Take 1 tablet by mouth daily.   metoprolol tartrate (LOPRESSOR) 25 MG tablet Take 1 tablet (25 mg total) by mouth 2 (two) times daily.   miconazole (MICATIN) 2 % cream Apply 1 Application topically 2 (two) times daily.   ondansetron (ZOFRAN) 4 MG tablet Take 1 tablet (4 mg total) by mouth every 8 (eight) hours as needed for nausea or vomiting.   OXYGEN Inhale 2 L into the lungs.   polyethylene glycol powder (GLYCOLAX/MIRALAX) 17 GM/SCOOP powder Take 17 g by mouth daily as needed for moderate constipation.   primidone (MYSOLINE) 50 MG tablet TAKE 1 TABLET BY MOUTH TWICE DAILY   Probiotic Product (PROBIOTIC DAILY PO) Take 1 tablet by mouth daily.   Propylene Glycol (SYSTANE BALANCE OP) Place 1 drop into both eyes 4 (four) times daily as needed (dry eyes).   rOPINIRole (REQUIP) 1 MG tablet TAKE 1 TABLET(1 MG) BY MOUTH AT  BEDTIME   Semaglutide, 1 MG/DOSE, 4 MG/3ML SOPN Inject 1 mg as directed once a week.   Semaglutide,0.25 or 0.5MG/DOS, (OZEMPIC, 0.25 OR 0.5 MG/DOSE,) 2 MG/1.5ML SOPN Inject 1 mg into the skin once a week.   topiramate (TOPAMAX) 50 MG tablet Take 1 tablet (50 mg total) by mouth at bedtime.   No facility-administered encounter medications on file as of 01/19/2022.      [facility] Clinical Pharmacist Assistant [phone]

## 2022-01-20 ENCOUNTER — Ambulatory Visit (INDEPENDENT_AMBULATORY_CARE_PROVIDER_SITE_OTHER): Payer: PPO | Admitting: Pharmacist

## 2022-01-20 DIAGNOSIS — E1169 Type 2 diabetes mellitus with other specified complication: Secondary | ICD-10-CM

## 2022-01-20 DIAGNOSIS — E118 Type 2 diabetes mellitus with unspecified complications: Secondary | ICD-10-CM

## 2022-01-20 NOTE — Progress Notes (Signed)
Chronic Care Management Pharmacy Note  01/20/2022 Name:  Brandi Stephens MRN:  [id number] DOB:  [birth year]  Summary: A1c at goal < 7% LDL is not at goal < 70 TG is not at goal < 150  Recommendations/Changes made from today's visit: -Recommended scheduling cardiology follow up -Recommend repeat lipid panel and consider switching krill oil to Vascepa if TGs remain > 150 -Recommend repeat DEXA  Plan: Follow up after discussion with PCP Follow up on completion of [name] PAP  Subjective: Brandi Stephens is an 76 y.o. year old female who is a primary patient of Carlena Hurl, PA-C.  The CCM team was consulted for assistance with disease management and care coordination needs.    Engaged with patient by telephone for follow up visit in response to provider referral for pharmacy case management and/or care coordination services.   Consent to Services:  The patient was given information about Chronic Care Management services, agreed to services, and gave verbal consent prior to initiation of services.  Please see initial visit note for detailed documentation.   Patient Care Team: Tysinger, Camelia Eng, PA-C as PCP - General (Family Medicine) Janina Mayo, MD as PCP - Cardiology (Cardiology) Stark Klein, MD as Consulting Physician (General Surgery) [name], MD as Consulting Physician (Family Medicine) Griselda Miner, MD as Consulting Physician (Dermatology) Delice Bison, Charlestine Massed, NP as Nurse Practitioner (Hematology and Oncology) Marchia Bond, MD as Consulting Physician (Orthopedic Surgery) Armbruster, Carlota Raspberry, MD as Consulting Physician (Gastroenterology) [name], MD as Consulting Physician (Neurosurgery) Marcial Pacas, MD as Consulting Physician (Neurology) Viona Gilmore, [facility] as Pharmacist (Pharmacist)  Recent office visits: 01/05/22 Brandi Bode,  PA-C: Patient presented for DM follow up from previous lows. Patient is no longer having lows.  Administered influenza and Prevnar vaccines.  12/31/21 Jill Alexanders, MD: Patient presented for low blood sugars. Recommended decreasing her dose of Ozempic to 0.5 mg per week.  11/19/21 Brandi Bode,  PA-C: Patient presented for med check. Follow up for fasting labs.  08/05/21 Brandi Bode,  PA-C: Patient presented for med check. Plan for 3 month fasting follow up. Increased allopurinol due to increased uric acid level. Increased Trulicity to 3 mg weekly.  Recent consult visits: 11/25/21 Jola Schmidt, MD (ophthalmology): Patient presented for corneal scars. Unable to access notes.  11/15/21 Jola Schmidt, MD (ophthalmology): Patient presented for conjunctiva injury. Unable to access notes.  11/12/21 Jola Schmidt, MD (ophthalmology): Patient presented for foreign body in cornea. Unable to access notes.  11/09/21 [name], MD (ID): Patient presented for osteomyelitis of spine follow up. Continue cefadroxil. Follow up in 6 months.  11/09/21 Jola Schmidt, MD (ophthalmology): Patient presented for dry eye syndrome. Unable to access notes.   10/28/21 Wilber Bihari, NP (oncology): Patient presented for breast cancer follow up. Follow up in 1 year.  09/14/21 Phineas Inches, MD (cardiology): Patient presented for palpitations. Plan for 7 day Zio monitor and EKG.  08/27/21 Raulkar, Clide Deutscher, MD (Phys Med): Patient presented for Idiopathic progressive neuropathy and other concerns.  08/25/21 [name], MD (Inf Disease) - Patient presented for osteomyelitis of spine follow up. Plan for chronic suppressive cefadroxil. Follow up in 2-3 months.  08/24/21 Raulkar, Clide Deutscher, MD (Phys Med): Patient presented for Idiopathic progressive neuropathy and other concerns. Follow up in 3 months and given Qutenza patches.  08/10/21 [name], MD (Inf Disease) - Patient presented for osteomyelitis of spine follow up. Plan for chronic suppressive cefadroxil.  08/10/21 Raulkar, Clide Deutscher, MD (Phys  Med): Patient  presented for Idiopathic progressive neuropathy and other concerns. Follow up in 3 months and given Qutenza patches.  07/27/21 Sherrilyn Rist, MD (pulmonary): Patient presented for centrilobular emphysema follow up. Follow up in 6 months.  Hospital visits: None in previous 6 months.  Objective:  Lab Results  Component Value Date   CREATININE 1.07 (H) 01/05/2022   BUN 19 01/05/2022   GFRNONAA 38 (L) 01/26/2021   GFRAA 52 (L) 06/04/2020   NA 141 01/05/2022   K 4.3 01/05/2022   CALCIUM 9.8 01/05/2022   CO2 25 01/05/2022   GLUCOSE 119 (H) 01/05/2022    Lab Results  Component Value Date/Time   HGBA1C 6.1 (H) 01/05/2022 04:42 PM   HGBA1C 6.6 (H) 08/13/2021 02:26 PM    Last diabetic Eye exam:  Lab Results  Component Value Date/Time   HMDIABEYEEXA Retinopathy (A) 11/08/2021 12:00 AM    Last diabetic Foot exam: No results found for: "HMDIABFOOTEX"   Lab Results  Component Value Date   CHOL 180 08/13/2021   HDL 46 08/13/2021   LDLCALC 80 08/13/2021   TRIG 336 (H) 08/13/2021   CHOLHDL 3.9 08/13/2021       Latest Ref Rng & Units 01/05/2022    4:42 PM 08/13/2021    2:26 PM 04/27/2021    1:33 PM  Hepatic Function  Total Protein 6.0 - 8.5 g/dL 6.6  6.5  6.3   Albumin 3.8 - 4.8 g/dL 4.5  4.4  4.4   AST 0 - 40 IU/L _0 ALT 0 - 32 IU/L _1 Alk Phosphatase 44 - 121 IU/L 101  122  112   Total Bilirubin 0.0 - 1.2 mg/dL 0.3  0.4  0.2     Lab Results  Component Value Date/Time   TSH 3.350 01/05/2022 04:42 PM   TSH 2.200 04/01/2021 12:42 PM   FREET4 1.23 01/05/2022 04:42 PM   FREET4 1.15 06/04/2020 03:11 PM       Latest Ref Rng & Units 01/05/2022    4:42 PM 08/13/2021    2:26 PM 04/01/2021   12:42 PM  CBC  WBC 3.4 - 10.8 x10E3/uL 10.8  12.1  12.0   Hemoglobin 11.1 - 15.9 g/dL 12.7  11.8  12.6   Hematocrit 34.0 - 46.6 % 38.1  34.7  37.7   Platelets 150 - 450 x10E3/uL 274  243  257     Lab Results  Component Value Date/Time   VD25OH 56.9 08/13/2021  02:26 PM   VD25OH 32.9 01/31/2020 10:40 AM    Clinical ASCVD: No  The 10-year ASCVD risk score (Arnett DK, et al., 2019) is: 34.5%   Values used to calculate the score:     Age: 76 years     Sex: Female     Is Non-Hispanic African American: No     Diabetic: Yes     Tobacco smoker: No     Systolic Blood Pressure: 779 mmHg     Is BP treated: Yes     HDL Cholesterol: 46 mg/dL     Total Cholesterol: 180 mg/dL       11/19/2021    1:40 PM 08/25/2021   10:32 AM 08/24/2021    9:36 AM  Depression screen PHQ 2/9  Decreased Interest 0 0 0  Down, Depressed, Hopeless 0 0 0  PHQ - 2 Score 0 0 0  Altered sleeping 0    Tired, decreased energy 1  Change in appetite 0    Feeling bad or failure about yourself  0    Trouble concentrating 0    Moving slowly or fidgety/restless 0    Suicidal thoughts 0    PHQ-9 Score 1    Difficult doing work/chores Not difficult at all       Social History   Tobacco Use  Smoking Status Former   Packs/day: 0.50   Years: 35.00   Total pack years: 17.50   Types: Cigarettes   Quit date: 05/22/2018   Years since quitting: 3.6  Smokeless Tobacco Never   BP Readings from Last 3 Encounters:  01/05/22 124/80  12/31/21 100/60  11/19/21 98/60   Pulse Readings from Last 3 Encounters:  01/05/22 (!) 107  12/31/21 (!) 105  11/19/21 83   Wt Readings from Last 3 Encounters:  01/05/22 242 lb (109.8 kg)  11/19/21 248 lb (112.5 kg)  10/28/21 256 lb 4.8 oz (116.3 kg)   BMI Readings from Last 3 Encounters:  01/05/22 36.80 kg/m  11/19/21 37.71 kg/m  10/28/21 38.97 kg/m    Assessment/Interventions: Review of patient past medical history, allergies, medications, health status, including review of consultants reports, laboratory and other test data, was performed as part of comprehensive evaluation and provision of chronic care management services.   SDOH:  (Social Determinants of Health) assessments and interventions performed: Yes SDOH Interventions     Flowsheet Row Video Visit from 11/19/2021 in [place] Video Visit from 07/02/2021 in [facility] Interventions    Depression Interventions/Treatment  PHQ2-9 Score <4 Follow-up Not Indicated, Medication PHQ2-9 Score <4 Follow-up Not Indicated, Medication      SDOH Screenings   Food Insecurity: No Food Insecurity (02/26/2021)  Transportation Needs: No Transportation Needs (02/26/2021)  Depression (PHQ2-9): Low Risk  (11/19/2021)  Financial Resource Strain: Low Risk  (02/26/2021)  Physical Activity: Inactive (02/26/2021)  Stress: Stress Concern Present (02/26/2021)  Tobacco Use: Medium Risk (11/19/2021)    CCM Care Plan  Allergies  Allergen Reactions   Ampicillin Hives and Other (See Comments)    Severe reaction in February 2017 1.5 month to have hives to go away   Gabapentin Swelling    UNSPECIFIED REACTION    Penicillins Hives and Other (See Comments)    Severe reaction in February 2017 1.5 month to have hives to go away Has patient had a PCN reaction causing immediate rash, facial/tongue/throat swelling, SOB or lightheadedness with hypotension: #  #  #  YES  #  #  #  Has patient had a PCN reaction causing severe rash involving mucus membranes or skin necrosis: No Has patient had a PCN reaction that required hospitalization No Has patient had a PCN reaction occurring within the last 10 years: No    Other Itching    UNSPECIFIED Analgesics    Medications Reviewed Today     Reviewed by Viona Gilmore, [facility] (Pharmacist) on 01/20/22 at 9  Med List Status: <None>   Medication Order Taking? Sig Documenting Provider Last Dose Status Informant  allopurinol (ZYLOPRIM) 300 MG tablet [id number] No Take 1 tablet (300 mg total) by mouth daily. 1 daily Tysinger, Camelia Eng, PA-C Taking Active   amitriptyline (ELAVIL) 100 MG tablet [id number] No Take 1 tablet (100 mg total) by mouth at bedtime. Izora Ribas, MD Taking Active   atorvastatin (LIPITOR) 80  MG tablet [id number] No Take 1 tablet (80 mg total) by mouth daily. [name], MD Taking Active   betamethasone  dipropionate 0.05 % cream [id number] No Apply 1 application topically daily as needed (irritated skin). [provider] Taking Active Self  Biotin 3 MG TABS [id number] No Take 3 mg by mouth daily. [provider] Taking Active Self  blood glucose meter kit and supplies KIT [id number] No Dispense based on patient and insurance preference. Use up to four times daily as directed. Hosie Poisson, MD Taking Active   bumetanide (BUMEX) 1 MG tablet [id number] No Take 1 mg by mouth daily. [provider] Taking Active   cefadroxil (DURICEF) 500 MG capsule [id number] No Take 1 capsule (500 mg total) by mouth 2 (two) times daily. [name], MD Taking Active   Cholecalciferol (VITAMIN D3) 50 MCG (2000 UT) capsule [id number] No Take 1 capsule (2,000 Units total) by mouth daily. Tysinger, Camelia Eng, PA-C Taking Active   colchicine 0.6 MG tablet [id number] No Take 2 tablets at once then take 1 tablet an hour later then then 1 tablet twice daily x 3 days. Tysinger, Camelia Eng, PA-C Taking Active   Continuous Blood Gluc Sensor (FREESTYLE LIBRE 3 SENSOR) [place] [id number] No 1 each by Does not apply route daily. Place 1 sensor on the skin every 14 days. Use to check glucose continuously Tysinger, Camelia Eng, PA-C Taking Active   diazepam (VALIUM) 10 MG tablet [id number] No Take 1 tablet (10 mg total) by mouth every 6 (six) hours as needed for anxiety. Izora Ribas, MD Taking Active   DULoxetine (CYMBALTA) 60 MG capsule [id number] No TAKE 1 CAPSULE(60 MG) BY MOUTH DAILY Tysinger, Camelia Eng, PA-C Taking Active   Fluticasone-Umeclidin-Vilant ([name] ELLIPTA) 100-62.5-25 MCG/ACT AEPB [id number] No Inhale 1 puff into the lungs daily. Tysinger, Camelia Eng, PA-C Taking Active   HYDROcodone-acetaminophen (NORCO/VICODIN) 5-325 MG tablet [id number] No Take 1 tablet by mouth every 4 (four) hours as needed  for moderate pain. Izora Ribas, MD Taking Active   levothyroxine (SYNTHROID) 75 MCG tablet [id number] No TAKE 1 TABLET(75 MCG) BY MOUTH DAILY BEFORE BREAKFAST Tysinger, Camelia Eng, PA-C Taking Active   Magnesium Gluconate 250 MG TABS [id number] No Take 1 tablet by mouth daily. [provider] Taking Active   metoprolol tartrate (LOPRESSOR) 25 MG tablet [id number] No Take 1 tablet (25 mg total) by mouth 2 (two) times daily. [name], MD Taking Active   miconazole (MICATIN) 2 % cream [id number] No Apply 1 Application topically 2 (two) times daily. Izora Ribas, MD Taking Active   ondansetron (ZOFRAN) 4 MG tablet [id number] No Take 1 tablet (4 mg total) by mouth every 8 (eight) hours as needed for nausea or vomiting. Caryl Ada Taking Active   OXYGEN [id number] No Inhale 2 L into the lungs. [provider] Taking Active   polyethylene glycol powder (GLYCOLAX/MIRALAX) 17 GM/SCOOP powder [id number] No Take 17 g by mouth daily as needed for moderate constipation. [provider] Taking Active Self  primidone (MYSOLINE) 50 MG tablet [id number] No TAKE 1 TABLET BY MOUTH TWICE DAILY Raulkar, Clide Deutscher, MD Taking Active   Probiotic Product (PROBIOTIC DAILY PO) [id number] No Take 1 tablet by mouth daily. [provider] Taking Active Self  Propylene Glycol (SYSTANE BALANCE OP) [id number] No Place 1 drop into both eyes 4 (four) times daily as needed (dry eyes). [provider] Taking Active Self  rOPINIRole (REQUIP) 1 MG tablet [id number] No TAKE 1 TABLET(1 MG) BY MOUTH AT BEDTIME Tysinger, Camelia Eng, PA-C Taking Active   Semaglutide, 1 MG/DOSE, 4 MG/3ML SOPN [id number]  Inject  1 mg as directed once a week. Carlena Hurl, PA-C  Active   Discontinued 01/20/22 1355 (Completed Course)   topiramate (TOPAMAX) 50 MG tablet [id number] No Take 1 tablet (50 mg total) by mouth at bedtime. Izora Ribas, MD Taking Active             Patient Active  Problem List   Diagnosis Date Noted   Polypharmacy 01/05/2022   High risk medication use 01/05/2022   Dyslipidemia associated with type 2 diabetes mellitus ([place]) 11/19/2021   Frequent falls 08/05/2021   Elevated uric acid in blood 08/05/2021   Vitamin deficiency 08/05/2021   Chronic hypoxemic respiratory failure ([place]) 08/05/2021   Type II diabetes mellitus with complication ([place]) 40/98/1191   Other chronic pain 08/05/2021   Vaccine counseling 08/05/2021   Hypoxemia 07/02/2021   Weight gain 02/26/2021   Morbid obesity ([place])    Short-term memory loss 02/24/2020   Osteopenia 01/22/2020   Aortic atherosclerosis ([place]) 01/22/2020   Wheezing 01/22/2020   Insomnia 10/29/2019   Recurrent falls 09/06/2019   Confusion, postoperative    Chronic constipation    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   Muscle spasm    Hypokalemia    Status post lumbar spinal fusion 07/10/2019   Postoperative pain    Labile blood pressure    Hypoalbuminemia due to protein-calorie malnutrition ([place])    Anxiety 07/05/2019   Depression 07/05/2019   Chronic diastolic CHF (congestive heart failure) ([place]) 07/05/2019   Obesity (BMI 30-39.9) 07/05/2019   ETOH abuse 07/05/2019   Lumbar radiculopathy 06/28/2019   Spinal stenosis of lumbar region 06/28/2019   Tremors of nervous system    Chronic pain syndrome    Osteomyelitis of lumbar spine ([place]) 06/25/2019   Muscular deconditioning    Nausea 06/24/2019   History of sepsis 06/24/2019   Recent major surgery 06/24/2019   History of diverticulosis 06/24/2019   Elevated PTHrP level 05/09/2019   Low back pain 02/12/2019   Neuropathy 12/20/2018   Lumbar spinal stenosis 06/29/2018   Abnormal CT of the chest 06/04/2018   Hypothyroidism 11/30/2017   Thoracic aortic atherosclerosis ([place]) 09/14/2016   RLS (restless legs syndrome) 09/09/2016   H/O adenomatous polyp of colon 07/06/2016   PVC's (premature ventricular contractions) 03/29/2016   Grade I diastolic  dysfunction 47/82/9562   Former cigarette smoker 01/11/2016   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   Leg edema 01/11/2016   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   History of left breast cancer 07/14/2015   CKD (chronic kidney disease), stage III 07/08/2015   Dependent edema 06/08/2015   Sleep disturbance 06/08/2015   Mixed hyperlipidemia 06/08/2015   Generalized anxiety disorder 06/08/2015   Essential hypertension 06/08/2015   COPD (chronic obstructive pulmonary disease) ([place]) 06/08/2015   Gout 06/08/2015   Macrocytic anemia    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[place])    Sepsis ([place]) 05/15/2015   Lumbar spondylosis 03/20/2015    Immunization History  Administered Date(s) Administered   Fluad Quad(high Dose 65+) 12/20/2018, 01/22/2020, 01/05/2022   Influenza, High Dose Seasonal PF 05/01/2017   Influenza-Unspecified 12/17/2013, 12/12/2017   PFIZER Comirnaty([place])Covid-19 Tri-Sucrose Vaccine 09/20/2019, 10/22/2019   PNEUMOCOCCAL CONJUGATE-20 01/05/2022   Pneumococcal Conjugate-13 01/14/2014   Pneumococcal Polysaccharide-23 07/06/2016   Tdap 07/02/2013   Zoster Recombinat (Shingrix) 12/12/2017, 01/04/2019   Patient reports last night she was vomiting. She thinks this was brought on by stress and not the Ozempic. She reports her and her husband are in the process of downsizing. She has lived in her house 81  years and they have already sold the house and have a rental property they can stay in. They want a one story home because it'll be a lot easier to move around in.   Patient had trouble with her most recent sensor but once she got a sample from the office, she was good. She thinks it was just the one sensor. Patient reports her taste buds have been changing with everything going on but she isn't sure if this is from the [place] or not. She doesn't want to stop it at this point because she has lows about 50 lbs this year with the help of the Ozempic.   Patient is trying to walk  as much as she can. She doesn't enjoy swimming much so water aerobics isn't a great option for her. She does have some friends that go regularly. She is still walking with walker for the most part.     Conditions to be addressed/monitored:  Hypertension, Hyperlipidemia, Diabetes, Heart Failure, COPD, Hypothyroidism, Osteopenia, Gout, and Insomnia, Neuropathy  Conditions addressed this visit: Diabetes, hypertension   Care Plan : CCM Pharmacy Care Plan  Updates made by Viona Gilmore, [facility] since 01/20/2022 12:00 AM     Problem: Problem: Hypertension, Hyperlipidemia, Diabetes, Heart Failure, COPD, Hypothyroidism, Osteopenia, Gout, and Insomnia, Neuropathy      Long-Range Goal: Patient-Specific Goal   Start Date: 04/29/2021  Expected End Date: 04/29/2022  Recent Progress: On track  Priority: High  Note:   Current Barriers:  Unable to independently monitor therapeutic efficacy Unable to achieve control of cholesterol   Pharmacist Clinical Goal(s):  Patient will achieve adherence to monitoring guidelines and medication adherence to achieve therapeutic efficacy achieve control of cholesterol as evidenced by next lipid panel  through collaboration with PharmD and provider.   Interventions: 1:1 collaboration with Tysinger, Camelia Eng, PA-C regarding development and update of comprehensive plan of care as evidenced by provider attestation and co-signature Inter-disciplinary care team collaboration (see longitudinal plan of care) Comprehensive medication review performed; medication list updated in electronic medical record  Hypertension (BP goal <130/80) -Controlled -Current treatment: Metoprolol tartrate 25 mg 1 tablet twice daily - Appropriate, Effective, Safe, Accessible -Medications previously tried: unknown  -Current home readings: 105/70 (has a cuff) - PT and nurse coming out and checking -Current dietary habits: limits salt intake -Current exercise habits: minimal due to  pain -Denies hypotensive/hypertensive symptoms -Educated on BP goals and benefits of medications for prevention of heart attack, stroke and kidney damage; Importance of home blood pressure monitoring; Proper BP monitoring technique; -Counseled to monitor BP at home weekly, document, and provide log at future appointments -Counseled on diet and exercise extensively Recommended to continue current medication  Hyperlipidemia: (LDL goal < 70) -Uncontrolled -Current treatment: Atorvastatin 80 mg 1 tablet daily - Appropriate, Query effective, Safe, Accessible Cod liver oil 2 capsules 3 times a day (415 mg; EPA 37 mg & DHA 36 mg)  - Appropriate, Query effective, Safe, Accessible -Medications previously tried: Optometrist (cost)  -Current dietary patterns: doesn't eat fried foods - usually baked or broiled; olive oil; drinks a lot of water not doing much carbs (1 glass of wine every night) -Current exercise habits: minimal due to pain -Educated on Cholesterol goals;  Benefits of statin for ASCVD risk reduction; Importance of limiting foods high in cholesterol; Exercise goal of 150 minutes per week; -Counseled on diet and exercise extensively Recommended to continue current medication Recommended repeat lipid panel. Consider switching to Vascepa for triglyceride lowering.  Diabetes (A1c goal <7%) -Controlled -Current medications: Ozempic 1 mg inject once weekly - Appropriate, Effective, Safe, Accessible -Medications previously tried: Musician (cost) -Current home glucose readings fasting glucose: 120-130s post prandial glucose: not checking -Denies hypoglycemic/hyperglycemic symptoms -Current meal patterns:  breakfast: did not discuss  lunch: did not discuss   dinner: did not discuss  snacks: did not discuss  drinks: did not discuss  -Current exercise: unable to with pain -Educated on A1c and blood sugar goals; Complications of diabetes including kidney damage, retinal damage, and  cardiovascular disease; -Counseled to check feet daily and get yearly eye exams -Recommended to continue current medication  Heart Failure (Goal: manage symptoms and prevent exacerbations) -Controlled -Last ejection fraction: 65-70% (Date: 3/22) -HF type: Diastolic -NYHA Class: II (slight limitation of activity) -AHA HF Stage: A (HF risk factors present) -Current treatment: Bumetanide 1 mg daily - Appropriate, Effective, Safe, Accessible -Medications previously tried: furosemide  -Current home BP/HR readings: refer to above -Current dietary habits: doesn't use any salt and uses Mrs. Dash; only eats home cooking and loves vegetables (usually fresh); roasted vegetables; seafood and some beef or steak and doesn't -Current exercise habits: minimal with pain; daggers in her back when walking or standing -Educated on Importance of weighing daily; if you gain more than 3 pounds in one day or 5 pounds in one week, call cardiology. Proper diuretic administration and potassium supplementation Importance of blood pressure control -Counseled on diet and exercise extensively Recommended to continue current medication  COPD (Goal: control symptoms and prevent exacerbations) -Controlled -Current treatment  [name] 100-62.5-25 mcg/act 1 puff daily - Appropriate, Effective, Safe, Query accessible Albuterol as needed - Appropriate, Effective, Safe, Accessible -Medications previously tried: Advair (doesn't care for diskus)  -Medications previously tried: none  -Gold Grade: Gold 2 (FEV1 50-79%) -Current COPD Classification:  B (high sx, <2 exacerbations/yr) -MMRC/CAT score: n/a -Pulmonary function testing: 04/28/21 -Exacerbations requiring treatment in last 6 months: none -Patient reports consistent use of maintenance inhaler -Frequency of rescue inhaler use: not often (using oxygen constantly) -Counseled on Proper inhaler technique; Benefits of consistent maintenance inhaler use When to use rescue  inhaler Differences between maintenance and rescue inhalers -Recommended to continue current medication  Depression/Anxiety/Insomnia (Goal: minimize symptoms and improve sleep) -Controlled -Current treatment: Duloxetine 60 mg 1 capsule daily - Appropriate, Effective, Safe, Accessible Diazepam 5 mg 1 tablet as needed (uses sparingly) - Appropriate, Effective, Safe, Accessible Amitriptyline 100 mg 1 tablet daily at bedtime - Appropriate, Effective, Safe, Accessible -Medications previously tried/failed: Seroquel (sleep) -PHQ9: 0 -GAD7: n/a -Educated on Benefits of medication for symptom control Benefits of cognitive-behavioral therapy with or without medication -Recommended to continue current medication Recommended for patient to think about counseling.  Osteopenia (Goal prevent fractures) -Controlled -Last DEXA Scan: 11/2016   T-Score femoral neck: -2.0  T-Score total hip: n/a  T-Score lumbar spine: 0.9  T-Score forearm radius: n/a  10-year probability of major osteoporotic fracture: n/a  10-year probability of hip fracture: n/a -Patient is not a candidate for pharmacologic treatment -Current treatment  Vitamin D 50 mcg (2000 units daily) - Appropriate, Effective, Safe, Accessible -Medications previously tried: none  -Recommend [phone] units of vitamin D daily. Recommend 1200 mg of calcium daily from dietary and supplemental sources. Recommend weight-bearing and muscle strengthening exercises for building and maintaining bone density. -Recommended to continue current medication Patient is getting adequate calcium from dietary intake.  Hypothyroidism (Goal: TSH 2.5-4.5) -Controlled -Current treatment  Levothyroxine 75 mcg 1 tablet daily before breakfast - Appropriate, Effective, Safe,  Accessible -Medications previously tried: none  -Recommended to continue current medication  Gout (Goal: uric acid < 6 and prevent flare ups) -Not ideally controlled -Current treatment   Allopurinol 300 mg 1 tablet daily - Appropriate, Query effective, Safe, Accessible Colchicine as needed - Appropriate, Effective, Safe, Accessible -Medications previously tried: none  -Recommended repeat uric acid level and consider increasing.  Pain/muscle spasms (Goal: minimize pain/muscle spasms) -Controlled -Current treatment  Hydrocodone-PAP 5-325 mg 1 tablet twice daily as needed (taking sparingly) - Appropriate, Effective, Safe, Accessible Tylenol 650 mg 1 tablet as needed - Appropriate, Effective, Safe, Accessible Diclofenac gel as needed (at least once a day) - Appropriate, Effective, Safe, Accessible Baclofen 10 mg 1 tablet twice daily as needed -Appropriate, Effective, Safe, Accessible -Medications previously tried: Advil (doesn't use due to hyponatremia)  -Recommended to continue current medication  Restless legs syndrome (Goal: minimize symptoms) -Not ideally controlled -Current treatment  Ropinirole 1 mg 1 tablet daily - Appropriate, Query effective, Safe, Accessible -Medications previously tried: none  -Recommended to continue current medication  Peripheral neuropathy (Goal: minimize symptoms) -Controlled -Current treatment  Topamax 25 mg 1 tablet at bedtime - Appropriate, Effective, Safe, Accessible Duloxetine 60 mg 1 capsule daily - Appropriate, Effective, Safe, Accessible -Medications previously tried: none  -Recommended to continue current medication   Health Maintenance -Vaccine gaps: COVID booster -Current therapy:  Magnesium gluconate 500 mg daily Docusate 100 mg 1 tablet twice daily Cephalexin 500 mg 1 capsule three times daily Vitamin B12 1000 mcg 1 tablet daily Probiotic daily Miralax as needed Systane as needed Biotin 3 mg daily (helps with hair) Benadryl as needed -Educated on Cost vs benefit of each product must be carefully weighed by individual consumer Supplements may interfere with prescription drugs -Patient is satisfied with current  therapy and denies issues -Recommended switching Benadryl to cetirizine or loratadine due to less sedation.  Patient Goals/Self-Care Activities Patient will:  - take medications as prescribed as evidenced by patient report and record review  Follow Up Plan: Telephone follow up appointment with care management team member scheduled for: 3 months       Medication Assistance:  Ozempic obtained through [employer] medication assistance program.  Enrollment ends 04/17/22  Compliance/Adherence/Medication fill history: Care Gaps: COVID booster, foot exam Last A1c: 6.1% (01/05/22) BP- 142/85 10/28/21  Star-Rating Drugs: Atorvastatin 20 mg - Last filled 11/13/21 90 DS at [place] - PAP  Patient's preferred pharmacy is:  [facility] [place], [place] [address] [place] [address] Phone: [phone] Fax: [phone]  Uses pill box? Yes - 4 weeks  Pt endorses 95% compliance  We discussed: Benefits of medication synchronization, packaging and delivery as well as enhanced pharmacist oversight with [employer]. Patient decided to: Continue current medication management strategy  Care Plan and Follow Up Patient Decision:  Patient agrees to Care Plan and Follow-up.  Plan: Telephone follow up appointment with care management team member scheduled for:  3 months  Jeni Salles, PharmD, [place] [phone]

## 2022-01-20 NOTE — Patient Instructions (Signed)
Hi Brandi Stephens,  It was great to catch up with you again!  Please reach out to me if you have any questions or need anything before I reach back out!  Best, Maddie  Jeni Salles, PharmD, [facility] [phone]   Visit Information   Goals Addressed   None    Patient Care Plan: CCM Pharmacy Care Plan     Problem Identified: Problem: Hypertension, Hyperlipidemia, Diabetes, Heart Failure, COPD, Hypothyroidism, Osteopenia, Gout, and Insomnia, Neuropathy      Long-Range Goal: Patient-Specific Goal   Start Date: 04/29/2021  Expected End Date: 04/29/2022  Recent Progress: On track  Priority: High  Note:   Current Barriers:  Unable to independently monitor therapeutic efficacy Unable to achieve control of cholesterol   Pharmacist Clinical Goal(s):  Patient will achieve adherence to monitoring guidelines and medication adherence to achieve therapeutic efficacy achieve control of cholesterol as evidenced by next lipid panel  through collaboration with PharmD and provider.   Interventions: 1:1 collaboration with Tysinger, Camelia Eng, PA-C regarding development and update of comprehensive plan of care as evidenced by provider attestation and co-signature Inter-disciplinary care team collaboration (see longitudinal plan of care) Comprehensive medication review performed; medication list updated in electronic medical record  Hypertension (BP goal <130/80) -Controlled -Current treatment: Metoprolol tartrate 25 mg 1 tablet twice daily - Appropriate, Effective, Safe, Accessible -Medications previously tried: unknown  -Current home readings: 105/70 (has a cuff) - PT and nurse coming out and checking -Current dietary habits: limits salt intake -Current exercise habits: minimal due to pain -Denies hypotensive/hypertensive symptoms -Educated on BP goals and benefits of medications for prevention of heart attack, stroke and kidney damage; Importance  of home blood pressure monitoring; Proper BP monitoring technique; -Counseled to monitor BP at home weekly, document, and provide log at future appointments -Counseled on diet and exercise extensively Recommended to continue current medication  Hyperlipidemia: (LDL goal < 70) -Uncontrolled -Current treatment: Atorvastatin 80 mg 1 tablet daily - Appropriate, Query effective, Safe, Accessible Cod liver oil 2 capsules 3 times a day (415 mg; EPA 37 mg & DHA 36 mg)  - Appropriate, Query effective, Safe, Accessible -Medications previously tried: Optometrist (cost)  -Current dietary patterns: doesn't eat fried foods - usually baked or broiled; olive oil; drinks a lot of water not doing much carbs (1 glass of wine every night) -Current exercise habits: minimal due to pain -Educated on Cholesterol goals;  Benefits of statin for ASCVD risk reduction; Importance of limiting foods high in cholesterol; Exercise goal of 150 minutes per week; -Counseled on diet and exercise extensively Recommended to continue current medication Recommended repeat lipid panel. Consider switching to Vascepa for triglyceride lowering.   Diabetes (A1c goal <7%) -Controlled -Current medications: Ozempic 1 mg inject once weekly - Appropriate, Effective, Safe, Accessible -Medications previously tried: Musician (cost) -Current home glucose readings fasting glucose: 120-130s post prandial glucose: not checking -Denies hypoglycemic/hyperglycemic symptoms -Current meal patterns:  breakfast: did not discuss  lunch: did not discuss   dinner: did not discuss  snacks: did not discuss  drinks: did not discuss  -Current exercise: unable to with pain -Educated on A1c and blood sugar goals; Complications of diabetes including kidney damage, retinal damage, and cardiovascular disease; -Counseled to check feet daily and get yearly eye exams -Recommended to continue current medication  Heart Failure (Goal: manage symptoms and  prevent exacerbations) -Controlled -Last ejection fraction: 65-70% (Date: 3/22) -HF type: Diastolic -NYHA Class: II (slight limitation of activity) -AHA HF Stage: A (  HF risk factors present) -Current treatment: Bumetanide 1 mg daily - Appropriate, Effective, Safe, Accessible -Medications previously tried: furosemide  -Current home BP/HR readings: refer to above -Current dietary habits: doesn't use any salt and uses Mrs. Dash; only eats home cooking and loves vegetables (usually fresh); roasted vegetables; seafood and some beef or steak and doesn't -Current exercise habits: minimal with pain; daggers in her back when walking or standing -Educated on Importance of weighing daily; if you gain more than 3 pounds in one day or 5 pounds in one week, call cardiology. Proper diuretic administration and potassium supplementation Importance of blood pressure control -Counseled on diet and exercise extensively Recommended to continue current medication  COPD (Goal: control symptoms and prevent exacerbations) -Controlled -Current treatment  [name] 100-62.5-25 mcg/act 1 puff daily - Appropriate, Effective, Safe, Query accessible Albuterol as needed - Appropriate, Effective, Safe, Accessible -Medications previously tried: Advair (doesn't care for diskus)  -Medications previously tried: none  -Gold Grade: Gold 2 (FEV1 50-79%) -Current COPD Classification:  B (high sx, <2 exacerbations/yr) -MMRC/CAT score: n/a -Pulmonary function testing: 04/28/21 -Exacerbations requiring treatment in last 6 months: none -Patient reports consistent use of maintenance inhaler -Frequency of rescue inhaler use: not often (using oxygen constantly) -Counseled on Proper inhaler technique; Benefits of consistent maintenance inhaler use When to use rescue inhaler Differences between maintenance and rescue inhalers -Recommended to continue current medication  Depression/Anxiety/Insomnia (Goal: minimize symptoms and  improve sleep) -Controlled -Current treatment: Duloxetine 60 mg 1 capsule daily - Appropriate, Effective, Safe, Accessible Diazepam 5 mg 1 tablet as needed (uses sparingly) - Appropriate, Effective, Safe, Accessible Amitriptyline 100 mg 1 tablet daily at bedtime - Appropriate, Effective, Safe, Accessible -Medications previously tried/failed: Seroquel (sleep) -PHQ9: 0 -GAD7: n/a -Educated on Benefits of medication for symptom control Benefits of cognitive-behavioral therapy with or without medication -Recommended to continue current medication Recommended for patient to think about counseling.  Osteopenia (Goal prevent fractures) -Controlled -Last DEXA Scan: 11/2016   T-Score femoral neck: -2.0  T-Score total hip: n/a  T-Score lumbar spine: 0.9  T-Score forearm radius: n/a  10-year probability of major osteoporotic fracture: n/a  10-year probability of hip fracture: n/a -Patient is not a candidate for pharmacologic treatment -Current treatment  Vitamin D 50 mcg (2000 units daily) - Appropriate, Effective, Safe, Accessible -Medications previously tried: none  -Recommend [phone] units of vitamin D daily. Recommend 1200 mg of calcium daily from dietary and supplemental sources. Recommend weight-bearing and muscle strengthening exercises for building and maintaining bone density. -Recommended to continue current medication Patient is getting adequate calcium from dietary intake.  Hypothyroidism (Goal: TSH 2.5-4.5) -Controlled -Current treatment  Levothyroxine 75 mcg 1 tablet daily before breakfast - Appropriate, Effective, Safe, Accessible -Medications previously tried: none  -Recommended to continue current medication  Gout (Goal: uric acid < 6 and prevent flare ups) -Not ideally controlled -Current treatment  Allopurinol 300 mg 1 tablet daily - Appropriate, Query effective, Safe, Accessible Colchicine as needed - Appropriate, Effective, Safe, Accessible -Medications previously  tried: none  -Recommended repeat uric acid level and consider increasing.  Pain/muscle spasms (Goal: minimize pain/muscle spasms) -Controlled -Current treatment  Hydrocodone-PAP 5-325 mg 1 tablet twice daily as needed (taking sparingly) - Appropriate, Effective, Safe, Accessible Tylenol 650 mg 1 tablet as needed - Appropriate, Effective, Safe, Accessible Diclofenac gel as needed (at least once a day) - Appropriate, Effective, Safe, Accessible Baclofen 10 mg 1 tablet twice daily as needed -Appropriate, Effective, Safe, Accessible -Medications previously tried: Advil (doesn't use due to hyponatremia)  -  Recommended to continue current medication  Restless legs syndrome (Goal: minimize symptoms) -Not ideally controlled -Current treatment  Ropinirole 1 mg 1 tablet daily - Appropriate, Query effective, Safe, Accessible -Medications previously tried: none  -Recommended to continue current medication  Peripheral neuropathy (Goal: minimize symptoms) -Controlled -Current treatment  Topamax 25 mg 1 tablet at bedtime - Appropriate, Effective, Safe, Accessible Duloxetine 60 mg 1 capsule daily - Appropriate, Effective, Safe, Accessible -Medications previously tried: none  -Recommended to continue current medication   Health Maintenance -Vaccine gaps: COVID booster -Current therapy:  Magnesium gluconate 500 mg daily Docusate 100 mg 1 tablet twice daily Cephalexin 500 mg 1 capsule three times daily Vitamin B12 1000 mcg 1 tablet daily Probiotic daily Miralax as needed Systane as needed Biotin 3 mg daily (helps with hair) Benadryl as needed -Educated on Cost vs benefit of each product must be carefully weighed by individual consumer Supplements may interfere with prescription drugs -Patient is satisfied with current therapy and denies issues -Recommended switching Benadryl to cetirizine or loratadine due to less sedation.  Patient Goals/Self-Care Activities Patient will:  - take  medications as prescribed as evidenced by patient report and record review  Follow Up Plan: Telephone follow up appointment with care management team member scheduled for: 3 months       Patient verbalizes understanding of instructions and care plan provided today and agrees to view in [facility]. Active MyChart status and patient understanding of how to access instructions and care plan via MyChart confirmed with patient.    The pharmacy team will reach out to the patient again over the next 7 days.   Brandi Stephens, [facility]

## 2022-01-21 ENCOUNTER — Encounter: Payer: PPO | Admitting: Physical Medicine and Rehabilitation

## 2022-01-25 ENCOUNTER — Encounter: Payer: Self-pay | Admitting: Internal Medicine

## 2022-01-27 ENCOUNTER — Telehealth: Payer: Self-pay | Admitting: Medical

## 2022-01-27 ENCOUNTER — Other Ambulatory Visit: Payer: Self-pay | Admitting: Medical

## 2022-01-27 NOTE — Telephone Encounter (Signed)
PT ASSISTANCE OZEMPIC  received, Pt informed

## 2022-02-02 ENCOUNTER — Telehealth: Payer: Self-pay | Admitting: Pharmacist

## 2022-02-02 NOTE — Progress Notes (Signed)
Call to [employer] to check status of application for Trellegy. Patient has been approved until 04/17/2022 and will need to call for refill at the end of year according to date on packing slip. First shipment due to be sent within 14 business days from today. Also completed renewal application for Ozempic for year 2024 to be mailed to patient with instructions for completion. Left voicemail of the above with patient including my contact information if she has any additional questions.  Patient Assistance: [place] 2023 approved [employer] 2023 approved- 2024 in process  [place] Clinical Pharmacist Assistant [phone]

## 2022-02-03 NOTE — Telephone Encounter (Signed)
Patient called me to let me know she still has a 2 week's supply of the [name] on hand. She is aware she was approved for assistance until the end of the year and should receive that in the mail in the next few weeks. Will call in a free 30 day voucher for [name] to [facility] to hold her over until patient assistance arrives.  Patient informed me that [employer] sent the Ozempic 0.5 mg pens and not the 1 mg dose. Her husband picked it up from the office and didn't realize the mistake until he got home. Will follow up with [employer] on this.  Patient also informed me she is moving and has to be out of her house by Thanksgiving. Recommended letting us know closer to her moveout date to update her address.

## 2022-02-04 DIAGNOSIS — J449 Chronic obstructive pulmonary disease, unspecified: Secondary | ICD-10-CM | POA: Diagnosis not present

## 2022-02-04 NOTE — Chronic Care Management (AMB) (Signed)
Call to [employer] in regard to incorrect dose sent of Ozempic to patient, was advised they had sent the '1mg'$  pens on sept the 18th and they are due for refill on Dec 18 th they had not received cancellation of her prior dose when the updated info was sent over so they sent both doses. Cancelled out the prior dose to avoid this happening again and called patient to make aware, Did not reach but left a detailed message with my contact information if she has any additional questions.    [place] Clinical Pharmacist Assistant [phone]

## 2022-02-07 ENCOUNTER — Telehealth (INDEPENDENT_AMBULATORY_CARE_PROVIDER_SITE_OTHER): Payer: PPO | Admitting: Medical

## 2022-02-07 VITALS — Temp 98.0°F | Wt 231.0 lb

## 2022-02-07 DIAGNOSIS — R110 Nausea: Secondary | ICD-10-CM

## 2022-02-07 DIAGNOSIS — J988 Other specified respiratory disorders: Secondary | ICD-10-CM

## 2022-02-07 DIAGNOSIS — R6883 Chills (without fever): Secondary | ICD-10-CM

## 2022-02-07 DIAGNOSIS — Z79899 Other long term (current) drug therapy: Secondary | ICD-10-CM

## 2022-02-07 DIAGNOSIS — E118 Type 2 diabetes mellitus with unspecified complications: Secondary | ICD-10-CM | POA: Diagnosis not present

## 2022-02-07 DIAGNOSIS — R051 Acute cough: Secondary | ICD-10-CM | POA: Diagnosis not present

## 2022-02-07 MED ORDER — MOLNUPIRAVIR EUA 200MG CAPSULE: 4.0000 | ORAL_CAPSULE | Freq: Two times a day (BID) | ORAL | 0 refills | Status: AC

## 2022-02-07 MED ORDER — EMERGEN-C IMMUNE PLUS PO PACK: 1.0000 | PACK | Freq: Two times a day (BID) | ORAL | 0 refills | Status: DC

## 2022-02-07 MED ORDER — PROMETHAZINE-DM 6.25-15 MG/5ML PO SYRP: 5.0000 mL | ORAL_SOLUTION | Freq: Four times a day (QID) | ORAL | 0 refills | Status: DC | PRN

## 2022-02-07 NOTE — Progress Notes (Signed)
Subjective:     Patient ID: Brandi Stephens, female   DOB: 1945/06/26, 76 y.o.   MRN: [id number]  This visit type was conducted due to national recommendations for restrictions regarding the COVID-19 Pandemic (e.g. social distancing) in an effort to limit this patient's exposure and mitigate transmission in our community.  Due to their co-morbid illnesses, this patient is at least at moderate risk for complications without adequate follow up.  This format is felt to be most appropriate for this patient at this time.    Documentation for virtual audio and video telecommunications through [place] encounter:  The patient was located at home. The provider was located in the office. The patient did consent to this visit and is aware of possible charges through their insurance for this visit.  The other persons participating in this telemedicine service were none. Time spent on call was 20 minutes and in review of previous records 20 minutes total.  This virtual service is not related to other E/M service within previous 7 days.   HPI Chief Complaint  Patient presents with   chills and Fever    Symptoms started yesterday- Chills, shaking, joint pain, HAs, loss appetite, vomitting if eat, taking theraflu medication, no fever. Whole family has been sick   Patient Care Team: Regine Christian, Camelia Eng, PA-C as PCP - General (Family Medicine) Debara Stephens Nadean Corwin, MD as PCP - Cardiology (Cardiology) Magrinat, Virgie Dad, MD as Consulting Physician (Oncology) Stark Klein, MD as Consulting Physician (General Surgery) [name], MD as Consulting Physician (Family Medicine) [name], MD as Consulting Physician (Neurosurgery) Griselda Miner, MD as Consulting Physician (Dermatology) Delice Bison, Charlestine Massed, NP as Nurse Practitioner (Hematology and Oncology) Marchia Bond, MD as Consulting Physician (Orthopedic Surgery) Clydell Hakim, MD (Inactive) as Consulting Physician  (Anesthesiology) Armbruster, Carlota Raspberry, MD as Consulting Physician (Gastroenterology) [name], MD as Consulting Physician (Neurosurgery) Marcial Pacas, MD as Consulting Physician (Neurology) Viona Gilmore, [employer] as Pharmacist (Pharmacist)   Mrs. Fluitt is a 76 yo female with past medical history of diastolic CHF, COPD on O2, CKD-3A, HTN, HLD, hyp  Virtual for illness.  She notes 2 days of illness.  Been around several family sick contacts.  Neither of the family members were diagnosed with covid.  She notes nausea, achy all over, chills, joint aches, nausea,headache, but no fever.  Has some cough.  Has mild runny nose and congestion.   No rash.  Hasn't done covid test yet.  No worse wheezing or SOB.  No other aggravating or relieving factors.   She sold her house and has to be out soon.   Will ultimately need her ozempic delivery re-routed for [facility] where she has a beach house   No other complaint.  Past Medical History:  Diagnosis Date   AIN (anal intraepithelial neoplasia) anal canal    Anemia    with pregnancy   Anxiety    Arthritis    knees, lower back, knees   Chronic constipation    Chronic diastolic CHF (congestive heart failure) ([place]) 07/05/2019   CKD (chronic kidney disease)    Coarse tremors    Essential   COPD (chronic obstructive pulmonary disease) ([place])    2017 chest CT, history of   Degenerative lumbar spinal stenosis    Depression    Depression 07/05/2019   Drug dependency ([place])    Elevated PTHrP level 05/09/2019   ETOH abuse 07/05/2019   GERD (gastroesophageal reflux disease)    pt denies   Gout  Grade I diastolic dysfunction 16/01/9603   Noted ECHO   Hemorrhoids    History of adenomatous polyp of colon    History of basal cell carcinoma (BCC) excision 2015   nose   History of sepsis 05/14/2014   post lumbar surgery   History of shingles    x2   Hyperlipidemia    Hypertension    Hypothyroidism    Irregular heart beat    history of  while undergoing radiation   Malignant neoplasm of upper-inner quadrant of left breast in female, estrogen receptor positive [facility]) oncologist-  dr Jana Hakim--  per lov in epic no recurrence   dx 07-13-2015  Left breast invasive ductal carcinoma, Stage IA, Grade 1 (TXN0),  09-16-2015  s/p  left breast lumpectomy with sln bx's,  completed radiation 11-18-2015,  started antiestrogen therapy 12-14-2015   Neuropathy    Left foot and back of left leg   Obesity (BMI 30-39.9) 07/05/2019   Personal history of radiation therapy    completed 11-18-2015  left breast   Pneumonia    PONV (postoperative nausea and vomiting)    Prediabetes    diet controlled, no med   Restless leg syndrome    Seasonal allergies    Seizure ([place]) 05/2015   due to sepsis, only one time episode   Tremor    Wears partial dentures    lower    Current Outpatient Medications on File Prior to Visit  Medication Sig Dispense Refill   allopurinol (ZYLOPRIM) 300 MG tablet Take 1 tablet (300 mg total) by mouth daily. 1 daily 90 tablet 3   amitriptyline (ELAVIL) 100 MG tablet Take 1 tablet (100 mg total) by mouth at bedtime. 90 tablet 3   atorvastatin (LIPITOR) 80 MG tablet Take 1 tablet (80 mg total) by mouth daily. 90 tablet 3   Biotin 3 MG TABS Take 3 mg by mouth daily.     bumetanide (BUMEX) 1 MG tablet Take 1 mg by mouth daily.     cefadroxil (DURICEF) 500 MG capsule Take 1 capsule (500 mg total) by mouth 2 (two) times daily. 180 capsule 3   Cholecalciferol (VITAMIN D3) 50 MCG (2000 UT) capsule Take 1 capsule (2,000 Units total) by mouth daily. 90 capsule 3   colchicine 0.6 MG tablet Take 2 tablets at once then take 1 tablet an hour later then then 1 tablet twice daily x 3 days. 30 tablet 2   diazepam (VALIUM) 10 MG tablet Take 1 tablet (10 mg total) by mouth every 6 (six) hours as needed for anxiety. 30 tablet 0   DULoxetine (CYMBALTA) 60 MG capsule TAKE 1 CAPSULE(60 MG) BY MOUTH DAILY 90 capsule 0    Fluticasone-Umeclidin-Vilant ([name] ELLIPTA) 100-62.5-25 MCG/ACT AEPB Inhale 1 puff into the lungs daily. 90 each 1   HYDROcodone-acetaminophen (NORCO/VICODIN) 5-325 MG tablet Take 1 tablet by mouth every 4 (four) hours as needed for moderate pain. 20 tablet 0   levothyroxine (SYNTHROID) 75 MCG tablet TAKE 1 TABLET(75 MCG) BY MOUTH DAILY BEFORE BREAKFAST 90 tablet 1   Magnesium Gluconate 250 MG TABS Take 1 tablet by mouth daily.     metoprolol tartrate (LOPRESSOR) 25 MG tablet Take 1 tablet (25 mg total) by mouth 2 (two) times daily. 180 tablet 3   ondansetron (ZOFRAN) 4 MG tablet Take 1 tablet (4 mg total) by mouth every 8 (eight) hours as needed for nausea or vomiting. 20 tablet 0   polyethylene glycol powder (GLYCOLAX/MIRALAX) 17 GM/SCOOP powder Take 17 g by  mouth daily as needed for moderate constipation.     primidone (MYSOLINE) 50 MG tablet TAKE 1 TABLET BY MOUTH TWICE DAILY 180 tablet 3   Probiotic Product (PROBIOTIC DAILY PO) Take 1 tablet by mouth daily.     Propylene Glycol (SYSTANE BALANCE OP) Place 1 drop into both eyes 4 (four) times daily as needed (dry eyes).     rOPINIRole (REQUIP) 1 MG tablet TAKE 1 TABLET(1 MG) BY MOUTH AT BEDTIME 90 tablet 0   Semaglutide, 1 MG/DOSE, 4 MG/3ML SOPN Inject 1 mg as directed once a week. 3 mL 2   topiramate (TOPAMAX) 50 MG tablet Take 1 tablet (50 mg total) by mouth at bedtime. 90 tablet 3   blood glucose meter kit and supplies KIT Dispense based on patient and insurance preference. Use up to four times daily as directed. 1 each 0   Continuous Blood Gluc Sensor (FREESTYLE LIBRE 3 SENSOR) MISC 1 each by Does not apply route daily. Place 1 sensor on the skin every 14 days. Use to check glucose continuously 2 each 11   OXYGEN Inhale 2 L into the lungs.     No current facility-administered medications on file prior to visit.     Review of Systems As in subjective    Objective:   Physical Exam Due to coronavirus pandemic stay at home measures,  patient visit was virtual and they were not examined in person.   Gen: wd, wn,nad, seated on oxygen nasal cannula No obvious wheezing or worse labored breathing than her baseline.      Assessment:     Encounter Diagnoses  Name Primary?   Acute cough Yes   Chills    Nausea    Respiratory tract infection    Type II diabetes mellitus with complication (HCC)    High risk medication use        Plan:     We discussed her current symptoms that suggest a viral syndrome such as flu like symptoms or COVID.  I sent cough medicine and vitamin pack to the pharmacy.  We discussed rest, hydration, continue your current medications and oxygen.  I also sent molnupiravir in the event she tested positive for COVID.  She was not able to come up to our office for testing today for flu and COVID.  She is going to do a home COVID test.  She has several family members that are sick with similar symptoms suggesting viral or contagious respiratory tract infection.  If worse in the next 2 days or not improving call or get reevaluated  We discussed her upcoming move to [facility] since she just recently sold her house here in [place].  She will ultimately need Ozempic sent somewhere down that way.  Apparently the community chronic disease management group worked out having her Ozempic come here at our office but we have no way to forward it onto another location   Briana was seen today for chills and fever.  Diagnoses and all orders for this visit:  Acute cough  Chills  Nausea  Respiratory tract infection  Type II diabetes mellitus with complication (HCC)  High risk medication use  Other orders -     promethazine-dextromethorphan (PROMETHAZINE-DM) 6.25-15 MG/5ML syrup; Take 5 mLs by mouth 4 (four) times daily as needed for cough. -     molnupiravir EUA (LAGEVRIO) 200 mg CAPS capsule; Take 4 capsules (800 mg total) by mouth 2 (two) times daily for 5 days. -     Multiple  Vitamins-Minerals  (EMERGEN-C IMMUNE PLUS) PACK; Take 1 tablet by mouth 2 (two) times daily.  F/u prn

## 2022-02-08 ENCOUNTER — Telehealth: Payer: Self-pay | Admitting: Medical

## 2022-02-08 NOTE — Telephone Encounter (Signed)
Pt states she took 2 Covid tests yesterday and both were negative.  She is feeling a little bit better today.  She wanted me to let you know

## 2022-02-08 NOTE — Telephone Encounter (Signed)
done

## 2022-02-15 DIAGNOSIS — Z7985 Long-term (current) use of injectable non-insulin antidiabetic drugs: Secondary | ICD-10-CM

## 2022-02-15 DIAGNOSIS — I110 Hypertensive heart disease with heart failure: Secondary | ICD-10-CM

## 2022-02-15 DIAGNOSIS — J449 Chronic obstructive pulmonary disease, unspecified: Secondary | ICD-10-CM

## 2022-02-15 DIAGNOSIS — I509 Heart failure, unspecified: Secondary | ICD-10-CM

## 2022-02-15 DIAGNOSIS — E785 Hyperlipidemia, unspecified: Secondary | ICD-10-CM | POA: Diagnosis not present

## 2022-02-15 DIAGNOSIS — E1169 Type 2 diabetes mellitus with other specified complication: Secondary | ICD-10-CM | POA: Diagnosis not present

## 2022-02-16 ENCOUNTER — Telehealth: Payer: Self-pay | Admitting: Medical

## 2022-02-16 ENCOUNTER — Other Ambulatory Visit: Payer: Self-pay | Admitting: Medical

## 2022-02-16 MED ORDER — PROMETHAZINE-DM 6.25-15 MG/5ML PO SYRP: 5.0000 mL | ORAL_SOLUTION | Freq: Four times a day (QID) | ORAL | 0 refills | Status: DC | PRN

## 2022-02-16 MED ORDER — DOXYCYCLINE MONOHYDRATE 100 MG PO TABS: 100.0000 mg | ORAL_TABLET | Freq: Two times a day (BID) | ORAL | 0 refills | Status: DC

## 2022-02-16 NOTE — Telephone Encounter (Signed)
Pt was notified of results

## 2022-02-16 NOTE — Telephone Encounter (Signed)
Pt called and states that she had a video visit with you and did get better after that visit but now all her symptoms have returned. She still has the cough medication she was given and is taking that and it helps but as soon as it wears off cough starts right back up. Please advise pt at [phone]. Pt uses [facility] at [place].

## 2022-03-02 ENCOUNTER — Telehealth: Payer: Self-pay | Admitting: Pharmacist

## 2022-03-02 NOTE — Chronic Care Management (AMB) (Signed)
Pre filled out application for renewal of patient assistance for [name] 2024, to be mailed to patient with instructions for completion.     [place] Clinical Pharmacist Assistant [phone]

## 2022-03-04 ENCOUNTER — Ambulatory Visit: Payer: PPO

## 2022-03-07 DIAGNOSIS — J449 Chronic obstructive pulmonary disease, unspecified: Secondary | ICD-10-CM | POA: Diagnosis not present

## 2022-03-17 DIAGNOSIS — L920 Granuloma annulare: Secondary | ICD-10-CM | POA: Diagnosis not present

## 2022-03-17 DIAGNOSIS — L603 Nail dystrophy: Secondary | ICD-10-CM | POA: Diagnosis not present

## 2022-03-17 DIAGNOSIS — Z85828 Personal history of other malignant neoplasm of skin: Secondary | ICD-10-CM | POA: Diagnosis not present

## 2022-03-17 DIAGNOSIS — D2372 Other benign neoplasm of skin of left lower limb, including hip: Secondary | ICD-10-CM | POA: Diagnosis not present

## 2022-03-18 ENCOUNTER — Ambulatory Visit (INDEPENDENT_AMBULATORY_CARE_PROVIDER_SITE_OTHER): Payer: PPO

## 2022-03-18 VITALS — Ht 68.0 in | Wt 218.0 lb

## 2022-03-18 DIAGNOSIS — Z0000 Encounter for general adult medical examination without abnormal findings: Secondary | ICD-10-CM | POA: Diagnosis not present

## 2022-03-18 NOTE — Progress Notes (Signed)
I connected with Brandi Stephens today by telephone and verified that I am speaking with the correct person using two identifiers. Location patient: home Location provider: work Persons participating in the virtual visit: [name], [name] LPN.   I discussed the limitations, risks, security and privacy concerns of performing an evaluation and management service by telephone and the availability of in person appointments. I also discussed with the patient that there may be a patient responsible charge related to this service. The patient expressed understanding and verbally consented to this telephonic visit.    Interactive audio and video telecommunications were attempted between this provider and patient, however failed, due to patient having technical difficulties OR patient did not have access to video capability.  We continued and completed visit with audio only.     Vital signs may be patient reported or missing.  Subjective:   Brandi Stephens is a 76 y.o. female who presents for Medicare Annual (Subsequent) preventive examination.  Review of Systems     Cardiac Risk Factors include: advanced age (>63mn, >>34women);diabetes mellitus;dyslipidemia;hypertension;obesity (BMI >30kg/m2)     Objective:    Today's Vitals   03/18/22 1128 03/18/22 1129  Weight: 218 lb (98.9 kg)   Height: _0  (1.727 m)   PainSc:  8    Body mass index is 33.15 kg/m.     03/18/2022   11:36 AM 02/26/2021   11:32 AM 10/30/2019    2:16 PM 08/02/2019    2:41 PM 07/11/2019    5:00 PM 06/28/2019    4:29 PM 06/25/2019   10:22 AM  Advanced Directives  Does Patient Have a Medical Advance Directive? Yes Yes Yes No No No   Type of AParamed[place]of [place] will [place] will Living will      Copy of [place] Chart? Yes - validated most recent copy scanned in chart (See row information) Yes - validated most recent copy  scanned in chart (See row information)       Would patient like information on creating a medical advance directive?    Yes (Inpatient - patient defers creating a medical advance directive at this time - Information given);No - Patient declined No - Patient declined No - Patient declined No - Patient declined    Current Medications (verified) Outpatient Encounter Medications as of 03/18/2022  Medication Sig   allopurinol (ZYLOPRIM) 300 MG tablet Take 1 tablet (300 mg total) by mouth daily. 1 daily   amitriptyline (ELAVIL) 100 MG tablet Take 1 tablet (100 mg total) by mouth at bedtime.   atorvastatin (LIPITOR) 80 MG tablet Take 1 tablet (80 mg total) by mouth daily.   Biotin 3 MG TABS Take 3 mg by mouth daily.   blood glucose meter kit and supplies KIT Dispense based on patient and insurance preference. Use up to four times daily as directed.   bumetanide (BUMEX) 1 MG tablet Take 1 mg by mouth daily.   cefadroxil (DURICEF) 500 MG capsule Take 1 capsule (500 mg total) by mouth 2 (two) times daily.   Cholecalciferol (VITAMIN D3) 50 MCG (2000 UT) capsule Take 1 capsule (2,000 Units total) by mouth daily.   colchicine 0.6 MG tablet Take 2 tablets at once then take 1 tablet an hour later then then 1 tablet twice daily x 3 days.   Continuous Blood Gluc Sensor (FREESTYLE LIBRE 3 SENSOR) MISC 1 each by Does not apply route daily. Place 1 sensor on the  skin every 14 days. Use to check glucose continuously   diazepam (VALIUM) 10 MG tablet Take 1 tablet (10 mg total) by mouth every 6 (six) hours as needed for anxiety.   doxycycline (ADOXA) 100 MG tablet Take 1 tablet (100 mg total) by mouth 2 (two) times daily.   DULoxetine (CYMBALTA) 60 MG capsule TAKE 1 CAPSULE(60 MG) BY MOUTH DAILY   Fluticasone-Umeclidin-Vilant ([name] ELLIPTA) 100-62.5-25 MCG/ACT AEPB Inhale 1 puff into the lungs daily.   HYDROcodone-acetaminophen (NORCO/VICODIN) 5-325 MG tablet Take 1 tablet by mouth every 4 (four) hours as needed for  moderate pain.   levothyroxine (SYNTHROID) 75 MCG tablet TAKE 1 TABLET(75 MCG) BY MOUTH DAILY BEFORE BREAKFAST   Magnesium Gluconate 250 MG TABS Take 1 tablet by mouth daily.   metoprolol tartrate (LOPRESSOR) 25 MG tablet Take 1 tablet (25 mg total) by mouth 2 (two) times daily.   ondansetron (ZOFRAN) 4 MG tablet Take 1 tablet (4 mg total) by mouth every 8 (eight) hours as needed for nausea or vomiting.   OXYGEN Inhale 2 L into the lungs.   polyethylene glycol powder (GLYCOLAX/MIRALAX) 17 GM/SCOOP powder Take 17 g by mouth daily as needed for moderate constipation.   primidone (MYSOLINE) 50 MG tablet TAKE 1 TABLET BY MOUTH TWICE DAILY   Probiotic Product (PROBIOTIC DAILY PO) Take 1 tablet by mouth daily.   promethazine-dextromethorphan (PROMETHAZINE-DM) 6.25-15 MG/5ML syrup Take 5 mLs by mouth 4 (four) times daily as needed for cough.   Propylene Glycol (SYSTANE BALANCE OP) Place 1 drop into both eyes 4 (four) times daily as needed (dry eyes).   rOPINIRole (REQUIP) 1 MG tablet TAKE 1 TABLET(1 MG) BY MOUTH AT BEDTIME   Semaglutide, 1 MG/DOSE, 4 MG/3ML SOPN Inject 1 mg as directed once a week.   topiramate (TOPAMAX) 50 MG tablet Take 1 tablet (50 mg total) by mouth at bedtime.   Multiple Vitamins-Minerals (EMERGEN-C IMMUNE PLUS) PACK Take 1 tablet by mouth 2 (two) times daily. (Patient not taking: Reported on 03/18/2022)   No facility-administered encounter medications on file as of 03/18/2022.    Allergies (verified) Ampicillin, Gabapentin, Penicillins, and Other   History: Past Medical History:  Diagnosis Date   AIN (anal intraepithelial neoplasia) anal canal    Anemia    with pregnancy   Anxiety    Arthritis    knees, lower back, knees   Chronic constipation    Chronic diastolic CHF (congestive heart failure) ([place]) 07/05/2019   CKD (chronic kidney disease)    Coarse tremors    Essential   COPD (chronic obstructive pulmonary disease) ([place])    2017 chest CT, history of    Degenerative lumbar spinal stenosis    Depression    Depression 07/05/2019   Drug dependency ([place])    Elevated PTHrP level 05/09/2019   ETOH abuse 07/05/2019   GERD (gastroesophageal reflux disease)    pt denies   Gout    Grade I diastolic dysfunction 35/57/3220   Noted ECHO   Hemorrhoids    History of adenomatous polyp of colon    History of basal cell carcinoma (BCC) excision 2015   nose   History of sepsis 05/14/2014   post lumbar surgery   History of shingles    x2   Hyperlipidemia    Hypertension    Hypothyroidism    Irregular heart beat    history of while undergoing radiation   Malignant neoplasm of upper-inner quadrant of left breast in female, estrogen receptor positive [facility]) oncologist-  dr  magrinat--  per lov in epic no recurrence   dx 07-13-2015  Left breast invasive ductal carcinoma, Stage IA, Grade 1 (TXN0),  09-16-2015  s/p  left breast lumpectomy with sln bx's,  completed radiation 11-18-2015,  started antiestrogen therapy 12-14-2015   Neuropathy    Left foot and back of left leg   Obesity (BMI 30-39.9) 07/05/2019   Personal history of radiation therapy    completed 11-18-2015  left breast   Pneumonia    PONV (postoperative nausea and vomiting)    Prediabetes    diet controlled, no med   Restless leg syndrome    Seasonal allergies    Seizure ([place]) 05/2015   due to sepsis, only one time episode   Tremor    Wears partial dentures    lower    Past Surgical History:  Procedure Laterality Date   ABDOMINAL HYSTERECTOMY  1985   BASAL CELL CARCINOMA EXCISION  10/15   nose   BREAST BIOPSY Right 08/24/2015   BREAST BIOPSY Right 07/13/2015   BREAST BIOPSY Left 07/13/2015   BREAST EXCISIONAL BIOPSY Left 1972   BREAST LUMPECTOMY Left    BREAST LUMPECTOMY WITH RADIOACTIVE SEED AND SENTINEL LYMPH NODE BIOPSY Left 09/16/2015   Procedure: BREAST LUMPECTOMY WITH RADIOACTIVE SEED AND SENTINEL LYMPH NODE BIOPSY;  Surgeon: Stark Klein, MD;  Location: [place];  Service: General;  Laterality: Left;   BREAST REDUCTION SURGERY Bilateral 1998   CATARACT EXTRACTION W/ INTRAOCULAR LENS IMPLANT Left 2016   [place]   hallo   COLONOSCOPY  01/2013   polyps   EYE SURGERY Left 2016   HAND SURGERY Left 02-19-2002   dr Fredna Dow  _0    repair collateral ligament/ MPJ left thumb   HARDWARE REMOVAL N/A 07/24/2019   Procedure: REVISION OF HARDWARE Lumbar two - Sacral one. Extention of Fusion to Lumbar one;  Surgeon: [name], MD;  Location: [place];  Service: Neurosurgery;  Laterality: N/A;   HEMORRHOID SURGERY  2008   HIGH RESOLUTION ANOSCOPY N/A 02/28/2018   Procedure: HIGH RESOLUTION ANOSCOPY WITH BIOPSY;  Surgeon: Leighton Ruff, MD;  Location: [place];  Service: General;  Laterality: N/A;   KNEE ARTHROSCOPY Right 2002   LUMBAR SPINE SURGERY  03-20-2015   dr Kathyrn Sheriff   fusion L4-5, L5-S1   MOUTH SURGERY  07/14/2018   2 infected teeth removed   MULTIPLE TOOTH EXTRACTIONS     with bone grafting lower bottom right and left   REDUCTION MAMMAPLASTY Bilateral    RIGHT COLECTOMY  09-10-2003   dr Margot Chimes _1    multiple colon polyps (per path tubular adenoma's, hyperplastic , benign appendix, benign two lymph nodes   ROTATOR CUFF REPAIR Left 04/2016   TONSILLECTOMY  1969   TUBAL LIGATION Bilateral 1982   Family History  Problem Relation Age of Onset   Atrial fibrillation Mother    Ulcers Father    Breast cancer Maternal Aunt    Lung cancer Maternal Uncle    Stomach cancer Maternal Grandmother    Lung cancer Maternal Aunt    Brain cancer Maternal Aunt    Prostate cancer Maternal Grandfather    Stroke Paternal Grandmother    Tuberculosis Paternal Grandfather    Colon cancer Neg Hx    Esophageal cancer Neg Hx    Rectal cancer Neg Hx    Social History   Socioeconomic History   Marital status: Married    Spouse name: robert   Number of children:  3   Years of education: college   Highest  education level: Not on file  Occupational History   Occupation: retired  Tobacco Use   Smoking status: Former    Packs/day: 0.50    Years: 35.00    Total pack years: 17.50    Types: Cigarettes    Quit date: 05/22/2018    Years since quitting: 3.8   Smokeless tobacco: Never  Vaping Use   Vaping Use: Never used  Substance and Sexual Activity   Alcohol use: Yes    Comment: 1 glass of wine a night   Drug use: Not Currently    Comment: CBD tincture   Sexual activity: Yes    Birth control/protection: Post-menopausal  Other Topics Concern   Not on file  Social History Narrative   Lives with husband in a 2 story home.  Has 2 living children.  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  Education: college.    Right-handed.   Five cups coffee per week.   Social Determinants of Health   Financial Resource Strain: Low Risk  (03/18/2022)   Overall Financial Resource Strain (CARDIA)    Difficulty of Paying Living Expenses: Not hard at all  Food Insecurity: No Food Insecurity (03/18/2022)   Hunger Vital Sign    Worried About Running Out of Food in the Last Year: Never true    Ran Out of Food in the Last Year: Never true  Transportation Needs: No Transportation Needs (03/18/2022)   PRAPARE - Hydrologist (Medical): No    Lack of Transportation (Non-Medical): No  Physical Activity: Inactive (03/18/2022)   Exercise Vital Sign    Days of Exercise per Week: 0 days    Minutes of Exercise per Session: 0 min  Stress: No Stress Concern Present (03/18/2022)   [place]    Feeling of Stress : Only a little  Social Connections: Not on file    Tobacco Counseling Counseling given: Not Answered   Clinical Intake:  Pre-visit preparation completed: Yes  Pain : 0-10 Pain Score: 8  Pain Type: Chronic pain Pain Location: Back (left knee) Pain Descriptors / Indicators: Aching Pain Onset: More than  a month ago Pain Frequency: Constant     Nutritional Status: BMI > 30  Obese Nutritional Risks: None Diabetes: Yes  How often do you need to have someone help you when you read instructions, pamphlets, or other written materials from your doctor or pharmacy?: 1 - Never  Diabetic? Yes Nutrition Risk Assessment:  Has the patient had any N/V/D within the last 2 months?  No  Does the patient have any non-healing wounds?  No  Has the patient had any unintentional weight loss or weight gain?  No   Diabetes:  Is the patient diabetic?  Yes  If diabetic, was a CBG obtained today?  No  Did the patient bring in their glucometer from home?  No  How often do you monitor your CBG's? frequently.   Financial Strains and Diabetes Management:  Are you having any financial strains with the device, your supplies or your medication? No .  Does the patient want to be seen by Chronic Care Management for management of their diabetes?  No  Would the patient like to be referred to a Nutritionist or for Diabetic Management?  No   Diabetic Exams:  Diabetic Eye Exam: Completed 11/08/2021 Diabetic Foot Exam: Overdue, Pt has been advised about the  importance in completing this exam. Pt is scheduled for diabetic foot exam on next appointment.   Interpreter Needed?: No  Information entered by :: NAllen LPN   Activities of Daily Living    03/18/2022   11:41 AM  In your present state of health, do you have any difficulty performing the following activities:  Hearing? 0  Vision? 0  Difficulty concentrating or making decisions? 0  Walking or climbing stairs? 1  Dressing or bathing? 0  Doing errands, shopping? 0  Preparing Food and eating ? N  Using the Toilet? N  In the past six months, have you accidently leaked urine? Y  Do you have problems with loss of bowel control? N  Managing your Medications? N  Managing your Finances? N  Housekeeping or managing your Housekeeping? N    Patient Care  Team: Tysinger, Camelia Eng, PA-C as PCP - General (Family Medicine) Janina Mayo, MD as PCP - Cardiology (Cardiology) Stark Klein, MD as Consulting Physician (General Surgery) [name], MD as Consulting Physician (Family Medicine) Griselda Miner, MD as Consulting Physician (Dermatology) Delice Bison, Charlestine Massed, NP as Nurse Practitioner (Hematology and Oncology) Marchia Bond, MD as Consulting Physician (Orthopedic Surgery) Armbruster, Carlota Raspberry, MD as Consulting Physician (Gastroenterology) [name], MD as Consulting Physician (Neurosurgery) Marcial Pacas, MD as Consulting Physician (Neurology) Viona Gilmore, [facility] as Pharmacist (Pharmacist) Laurin Coder, MD as Consulting Physician (Pulmonary Disease)  Indicate any recent Medical Services you may have received from other than [facility] providers in the past year (date may be approximate).     Assessment:   This is a routine wellness examination for Brandi Stephens.  Hearing/Vision screen Vision Screening - Comments:: Regular eye exams, [facility], Dr. Valetta Close  Dietary issues and exercise activities discussed: Current Exercise Habits: The patient does not participate in regular exercise at present   Goals Addressed             This Visit's Progress    Patient Stated       03/18/2022, wants to walk and build endurance       Depression Screen    03/18/2022   11:40 AM 11/19/2021    1:40 PM 08/25/2021   10:32 AM 08/24/2021    9:36 AM 08/10/2021    2:37 PM 07/02/2021   10:46 AM 02/26/2021   11:34 AM  PHQ 2/9 Scores  PHQ - 2 Score 0 0 0 0 0 0 0  PHQ- 9 Score _0 Fall Risk    03/18/2022   11:37 AM 11/19/2021    1:39 PM 08/25/2021   10:32 AM 08/24/2021    9:36 AM 08/10/2021    2:37 PM  Fall Risk   Falls in the past year? _1 Comment fell out of chair, dizzy   Fell at home May 2023. No injury.   Number falls in past yr: 1 0 _2 Injury with Fall? 0 1 1 0 0  Comment     back  Risk for fall due  to : Medication side effect Impaired balance/gait     Follow up Falls evaluation completed;Education provided;Falls prevention discussed Falls evaluation completed       FALL RISK PREVENTION PERTAINING TO THE HOME:  Any stairs in or around the home? Yes  If so, are there any without handrails? No  Home free of loose throw rugs in walkways, pet beds, electrical cords, etc? Yes  Adequate  lighting in your home to reduce risk of falls? Yes   ASSISTIVE DEVICES UTILIZED TO PREVENT FALLS:  Life alert? No  Use of a cane, walker or w/c? Yes  Grab bars in the bathroom? Yes  Shower chair or bench in shower? Yes  Elevated toilet seat or a handicapped toilet? No   TIMED UP AND GO:  Was the test performed? No .       Cognitive Function:        03/18/2022   11:43 AM 02/26/2021   11:37 AM  6CIT Screen  What Year? 0 points 0 points  What month? 0 points 0 points  What time? 3 points 0 points  Count back from 20 0 points 0 points  Months in reverse 0 points 0 points  Repeat phrase 2 points 2 points  Total Score 5 points 2 points    Immunizations Immunization History  Administered Date(s) Administered   Fluad Quad(high Dose 65+) 12/20/2018, 01/22/2020, 01/05/2022   Influenza, High Dose Seasonal PF 05/01/2017   Influenza-Unspecified 12/17/2013, 12/12/2017   PFIZER Comirnaty(Gray Top)Covid-19 Tri-Sucrose Vaccine 09/20/2019, 10/22/2019   PNEUMOCOCCAL CONJUGATE-20 01/05/2022   Pneumococcal Conjugate-13 01/14/2014   Pneumococcal Polysaccharide-23 07/06/2016   Tdap 07/02/2013   Zoster Recombinat (Shingrix) 12/12/2017, 01/04/2019    TDAP status: Up to date  Flu Vaccine status: Up to date  Pneumococcal vaccine status: Up to date  Covid-19 vaccine status: Completed vaccines  Qualifies for Shingles Vaccine? Yes   Zostavax completed Yes   Shingrix Completed?: Yes  Screening Tests Health Maintenance  Topic Date Due   FOOT EXAM  Never done   Medicare Annual Wellness (AWV)   02/26/2022   COVID-19 Vaccine (3 - Pfizer risk series) 04/03/2022 (Originally 11/19/2019)   HEMOGLOBIN A1C  07/06/2022   OPHTHALMOLOGY EXAM  11/09/2022   Diabetic kidney evaluation - GFR measurement  01/06/2023   Diabetic kidney evaluation - Urine ACR  01/06/2023   DTaP/Tdap/Td (2 - Td or Tdap) 07/03/2023   COLONOSCOPY (Pts 45-100yr Insurance coverage will need to be confirmed)  01/06/2028   Pneumonia Vaccine 75 Years old  Completed   INFLUENZA VACCINE  Completed   DEXA SCAN  Completed   Hepatitis C Screening  Completed   Zoster Vaccines- Shingrix  Completed   HPV VACCINES  Aged Out    Health Maintenance  Health Maintenance Due  Topic Date Due   FOOT EXAM  Never done   Medicare Annual Wellness (AWV)  02/26/2022    Colorectal cancer screening: Type of screening: Colonoscopy. Completed 01/05/2018. Repeat every 5 years  Mammogram status: Completed 12/17/2021. Repeat every year  Bone Density status: Completed 08/16/2016.   Lung Cancer Screening: (Low Dose CT Chest recommended if Age 75-80years, 30 pack-year currently smoking OR have quit w/in 15years.) does not qualify.   Lung Cancer Screening Referral: no  Additional Screening:  Hepatitis C Screening: does qualify; Completed 07/08/2016  Vision Screening: Recommended annual ophthalmology exams for early detection of glaucoma and other disorders of the eye. Is the patient up to date with their annual eye exam?  Yes  Who is the provider or what is the name of the office in which the patient attends annual eye exams? Dr. BValetta CloseIf pt is not established with a provider, would they like to be referred to a provider to establish care? No .   Dental Screening: Recommended annual dental exams for proper oral hygiene  Community Resource Referral / Chronic Care Management: CRR required this visit?  No   CCM  required this visit?  No      Plan:     I have personally reviewed and noted the following in the patient's chart:   Medical  and social history Use of alcohol, tobacco or illicit drugs  Current medications and supplements including opioid prescriptions. Patient is not currently taking opioid prescriptions. Functional ability and status Nutritional status Physical activity Advanced directives List of other physicians Hospitalizations, surgeries, and ER visits in previous 12 months Vitals Screenings to include cognitive, depression, and falls Referrals and appointments  In addition, I have reviewed and discussed with patient certain preventive protocols, quality metrics, and best practice recommendations. A written personalized care plan for preventive services as well as general preventive health recommendations were provided to patient.     [name], LPN   [id number]/12/[id number]   Nurse Notes: Has had some dizzy spells. Appointment made with [name] for 03/23/2022.  Due to this being a virtual visit, the after visit summary with patients personalized plan was offered to patient via mail or my-chart.  Patient would like to access on my-chart

## 2022-03-18 NOTE — Patient Instructions (Signed)
Ms. Roper , Thank you for taking time to come for your Medicare Wellness Visit. I appreciate your ongoing commitment to your health goals. Please review the following plan we discussed and let me know if I can assist you in the future.   Screening recommendations/referrals: Colonoscopy: completed 01/05/2018, due 01/06/2023 Mammogram: completed 12/17/2021, due 12/19/2022 Bone Density: completed 08/16/2016 Recommended yearly ophthalmology/optometry visit for glaucoma screening and checkup Recommended yearly dental visit for hygiene and checkup  Vaccinations: Influenza vaccine: completed 01/05/2022 Pneumococcal vaccine: completed 01/05/2022 Tdap vaccine: completed 07/02/2013, due 07/03/2023 Shingles vaccine: completed   Covid-19: 10/22/2019, 09/20/2019  Advanced directives: copy in chart  Conditions/risks identified: none  Next appointment: Follow up in one year for your annual wellness visit    Preventive Care 48 Years and Older, Female Preventive care refers to lifestyle choices and visits with your health care provider that can promote health and wellness. What does preventive care include? A yearly physical exam. This is also called an annual well check. Dental exams once or twice a year. Routine eye exams. Ask your health care provider how often you should have your eyes checked. Personal lifestyle choices, including: Daily care of your teeth and gums. Regular physical activity. Eating a healthy diet. Avoiding tobacco and drug use. Limiting alcohol use. Practicing safe sex. Taking low-dose aspirin every day. Taking vitamin and mineral supplements as recommended by your health care provider. What happens during an annual well check? The services and screenings done by your health care provider during your annual well check will depend on your age, overall health, lifestyle risk factors, and family history of disease. Counseling  Your health care provider may ask you questions about  your: Alcohol use. Tobacco use. Drug use. Emotional well-being. Home and relationship well-being. Sexual activity. Eating habits. History of falls. Memory and ability to understand (cognition). Work and work Statistician. Reproductive health. Screening  You may have the following tests or measurements: Height, weight, and BMI. Blood pressure. Lipid and cholesterol levels. These may be checked every 5 years, or more frequently if you are over 90 years old. Skin check. Lung cancer screening. You may have this screening every year starting at age 13 if you have a 30-pack-year history of smoking and currently smoke or have quit within the past 15 years. Fecal occult blood test (FOBT) of the stool. You may have this test every year starting at age 81. Flexible sigmoidoscopy or colonoscopy. You may have a sigmoidoscopy every 5 years or a colonoscopy every 10 years starting at age 32. Hepatitis C blood test. Hepatitis B blood test. Sexually transmitted disease (STD) testing. Diabetes screening. This is done by checking your blood sugar (glucose) after you have not eaten for a while (fasting). You may have this done every 1-3 years. Bone density scan. This is done to screen for osteoporosis. You may have this done starting at age 45. Mammogram. This may be done every 1-2 years. Talk to your health care provider about how often you should have regular mammograms. Talk with your health care provider about your test results, treatment options, and if necessary, the need for more tests. Vaccines  Your health care provider may recommend certain vaccines, such as: Influenza vaccine. This is recommended every year. Tetanus, diphtheria, and acellular pertussis (Tdap, Td) vaccine. You may need a Td booster every 10 years. Zoster vaccine. You may need this after age 76. Pneumococcal 13-valent conjugate (PCV13) vaccine. One dose is recommended after age 75. Pneumococcal polysaccharide (PPSV23) vaccine.  One dose  is recommended after age 16. Talk to your health care provider about which screenings and vaccines you need and how often you need them. This information is not intended to replace advice given to you by your health care provider. Make sure you discuss any questions you have with your health care provider. Document Released: 05/01/2015 Document Revised: 12/23/2015 Document Reviewed: 02/03/2015 Elsevier Interactive Patient Education  2017 [place] Prevention in the Home Falls can cause injuries. They can happen to people of all ages. There are many things you can do to make your home safe and to help prevent falls. What can I do on the outside of my home? Regularly fix the edges of walkways and driveways and fix any cracks. Remove anything that might make you trip as you walk through a door, such as a raised step or threshold. Trim any bushes or trees on the path to your home. Use bright outdoor lighting. Clear any walking paths of anything that might make someone trip, such as rocks or tools. Regularly check to see if handrails are loose or broken. Make sure that both sides of any steps have handrails. Any raised decks and porches should have guardrails on the edges. Have any leaves, snow, or ice cleared regularly. Use sand or salt on walking paths during winter. Clean up any spills in your garage right away. This includes oil or grease spills. What can I do in the bathroom? Use night lights. Install grab bars by the toilet and in the tub and shower. Do not use towel bars as grab bars. Use non-skid mats or decals in the tub or shower. If you need to sit down in the shower, use a plastic, non-slip stool. Keep the floor dry. Clean up any water that spills on the floor as soon as it happens. Remove soap buildup in the tub or shower regularly. Attach bath mats securely with double-sided non-slip rug tape. Do not have throw rugs and other things on the floor that can make  you trip. What can I do in the bedroom? Use night lights. Make sure that you have a light by your bed that is easy to reach. Do not use any sheets or blankets that are too big for your bed. They should not hang down onto the floor. Have a firm chair that has side arms. You can use this for support while you get dressed. Do not have throw rugs and other things on the floor that can make you trip. What can I do in the kitchen? Clean up any spills right away. Avoid walking on wet floors. Keep items that you use a lot in easy-to-reach places. If you need to reach something above you, use a strong step stool that has a grab bar. Keep electrical cords out of the way. Do not use floor polish or wax that makes floors slippery. If you must use wax, use non-skid floor wax. Do not have throw rugs and other things on the floor that can make you trip. What can I do with my stairs? Do not leave any items on the stairs. Make sure that there are handrails on both sides of the stairs and use them. Fix handrails that are broken or loose. Make sure that handrails are as long as the stairways. Check any carpeting to make sure that it is firmly attached to the stairs. Fix any carpet that is loose or worn. Avoid having throw rugs at the top or bottom of the stairs. If  you do have throw rugs, attach them to the floor with carpet tape. Make sure that you have a light switch at the top of the stairs and the bottom of the stairs. If you do not have them, ask someone to add them for you. What else can I do to help prevent falls? Wear shoes that: Do not have high heels. Have rubber bottoms. Are comfortable and fit you well. Are closed at the toe. Do not wear sandals. If you use a stepladder: Make sure that it is fully opened. Do not climb a closed stepladder. Make sure that both sides of the stepladder are locked into place. Ask someone to hold it for you, if possible. Clearly mark and make sure that you can  see: Any grab bars or handrails. First and last steps. Where the edge of each step is. Use tools that help you move around (mobility aids) if they are needed. These include: Canes. Walkers. Scooters. Crutches. Turn on the lights when you go into a dark area. Replace any light bulbs as soon as they burn out. Set up your furniture so you have a clear path. Avoid moving your furniture around. If any of your floors are uneven, fix them. If there are any pets around you, be aware of where they are. Review your medicines with your doctor. Some medicines can make you feel dizzy. This can increase your chance of falling. Ask your doctor what other things that you can do to help prevent falls. This information is not intended to replace advice given to you by your health care provider. Make sure you discuss any questions you have with your health care provider. Document Released: 01/29/2009 Document Revised: 09/10/2015 Document Reviewed: 05/09/2014 Elsevier Interactive Patient Education  2017 [employer].

## 2022-03-21 ENCOUNTER — Ambulatory Visit: Payer: PPO | Admitting: Physical Medicine and Rehabilitation

## 2022-03-23 ENCOUNTER — Telehealth (INDEPENDENT_AMBULATORY_CARE_PROVIDER_SITE_OTHER): Payer: PPO | Admitting: Medical

## 2022-03-23 ENCOUNTER — Encounter: Payer: Self-pay | Admitting: Medical

## 2022-03-23 VITALS — HR 80 | Ht 67.5 in | Wt 218.0 lb

## 2022-03-23 DIAGNOSIS — I5032 Chronic diastolic (congestive) heart failure: Secondary | ICD-10-CM | POA: Diagnosis not present

## 2022-03-23 DIAGNOSIS — Z780 Asymptomatic menopausal state: Secondary | ICD-10-CM

## 2022-03-23 DIAGNOSIS — Z79899 Other long term (current) drug therapy: Secondary | ICD-10-CM | POA: Diagnosis not present

## 2022-03-23 DIAGNOSIS — E782 Mixed hyperlipidemia: Secondary | ICD-10-CM

## 2022-03-23 DIAGNOSIS — E038 Other specified hypothyroidism: Secondary | ICD-10-CM

## 2022-03-23 DIAGNOSIS — W19XXXA Unspecified fall, initial encounter: Secondary | ICD-10-CM

## 2022-03-23 DIAGNOSIS — E118 Type 2 diabetes mellitus with unspecified complications: Secondary | ICD-10-CM

## 2022-03-23 DIAGNOSIS — I493 Ventricular premature depolarization: Secondary | ICD-10-CM

## 2022-03-23 DIAGNOSIS — E2839 Other primary ovarian failure: Secondary | ICD-10-CM | POA: Diagnosis not present

## 2022-03-23 DIAGNOSIS — M1000 Idiopathic gout, unspecified site: Secondary | ICD-10-CM

## 2022-03-23 DIAGNOSIS — N1831 Chronic kidney disease, stage 3a: Secondary | ICD-10-CM

## 2022-03-23 DIAGNOSIS — J9611 Chronic respiratory failure with hypoxia: Secondary | ICD-10-CM | POA: Diagnosis not present

## 2022-03-23 DIAGNOSIS — R42 Dizziness and giddiness: Secondary | ICD-10-CM

## 2022-03-23 DIAGNOSIS — J431 Panlobular emphysema: Secondary | ICD-10-CM

## 2022-03-23 NOTE — Progress Notes (Signed)
Subjective:     Patient ID: Brandi Stephens, female   DOB: 29-Dec-1945, 76 y.o.   MRN: [id number]  This visit type was conducted due to national recommendations for restrictions regarding the COVID-19 Pandemic (e.g. social distancing) in an effort to limit this patient's exposure and mitigate transmission in our community.  Due to their co-morbid illnesses, this patient is at least at moderate risk for complications without adequate follow up.  This format is felt to be most appropriate for this patient at this time.    Documentation for virtual audio and video telecommunications through [facility] encounter:  The patient was located at home. The provider was located in the office. The patient did consent to this visit and is aware of possible charges through their insurance for this visit.  The other persons participating in this telemedicine service were daughter Brandi Beach Time spent on call was 20 minutes and in review of previous records 20 minutes total.  This virtual service is not related to other E/M service within previous 7 days.   HPI Chief Complaint  Patient presents with   other    Dizzy spells, started about 2 weeks ago causing falls, about 7 falls in two week hurt leg ankle and knee on lt. Leg, different times of the day the dizzy spells come on, Thanksgiving day all the sudden fell out of your chair just fainted,    Consult for dizzy spells.  Her daughter Brandi Beach is there with her today  In the past 2 weeks she has fallen about 7 times due to dizziness.  She notes that she has felt dizzy or lightheaded when she stands.  She will try to stand and get her balance and start to walk but all of a sudden she feels a quick darkness.  She denies losing complete consciousness.  She just falls out.  She denies any recent low sugar readings.  She is checking her blood sugars.  She feels like her pulse ox machine and blood pressure cuff are not working quite right.  Today for example  she has gotten pulse rates ranging from 37-83 all within minutes that each other.  She got a blood pressure reading while we are on the phone that was 100/94.  But in recent days her blood pressure machine was not working  She denies any palpitations, chest pain, shortness of breath worse than usual.  No sweats or nausea with any of these dizzy spells.  No room spinning sensation.  No slurred speech new numbness or tingling.  She was at dermatology the other day and had a dizzy spell.  She said they checked her pulse and blood pressure but they were not unusual  She saw dermatology recently and they said some of her skin findings were concerning for heart failure.  She would like her back to see cardiology.  They called but the soonest appointment they get is 2 months away.  She sees Dr. [name] now at the same office of Dr. Debara Stephens who she was seeing formally  She started the year with a much higher weight.  She is down to 218 pounds currently.  She has been active, lose weight through healthy diet and GLP-1 medication.  No other aggravating or relieving factors. No other complaint.  Past Medical History:  Diagnosis Date   AIN (anal intraepithelial neoplasia) anal canal    Anemia    with pregnancy   Anxiety    Arthritis    knees, lower back, knees  Chronic constipation    Chronic diastolic CHF (congestive heart failure) ([place]) 07/05/2019   CKD (chronic kidney disease)    Coarse tremors    Essential   COPD (chronic obstructive pulmonary disease) ([place])    2017 chest CT, history of   Degenerative lumbar spinal stenosis    Depression    Depression 07/05/2019   Drug dependency ([place])    Elevated PTHrP level 05/09/2019   ETOH abuse 07/05/2019   GERD (gastroesophageal reflux disease)    pt denies   Gout    Grade I diastolic dysfunction 60/63/0160   Noted ECHO   Hemorrhoids    History of adenomatous polyp of colon    History of basal cell carcinoma (BCC) excision 2015   nose   History of  sepsis 05/14/2014   post lumbar surgery   History of shingles    x2   Hyperlipidemia    Hypertension    Hypothyroidism    Irregular heart beat    history of while undergoing radiation   Malignant neoplasm of upper-inner quadrant of left breast in female, estrogen receptor positive [facility]) oncologist-  dr Jana Hakim--  per lov in epic no recurrence   dx 07-13-2015  Left breast invasive ductal carcinoma, Stage IA, Grade 1 (TXN0),  09-16-2015  s/p  left breast lumpectomy with sln bx's,  completed radiation 11-18-2015,  started antiestrogen therapy 12-14-2015   Neuropathy    Left foot and back of left leg   Obesity (BMI 30-39.9) 07/05/2019   Personal history of radiation therapy    completed 11-18-2015  left breast   Pneumonia    PONV (postoperative nausea and vomiting)    Prediabetes    diet controlled, no med   Restless leg syndrome    Seasonal allergies    Seizure ([place]) 05/2015   due to sepsis, only one time episode   Tremor    Wears partial dentures    lower    Current Outpatient Medications on File Prior to Visit  Medication Sig Dispense Refill   allopurinol (ZYLOPRIM) 300 MG tablet Take 1 tablet (300 mg total) by mouth daily. 1 daily 90 tablet 3   amitriptyline (ELAVIL) 100 MG tablet Take 1 tablet (100 mg total) by mouth at bedtime. 90 tablet 3   atorvastatin (LIPITOR) 80 MG tablet Take 1 tablet (80 mg total) by mouth daily. 90 tablet 3   Biotin 3 MG TABS Take 3 mg by mouth daily.     blood glucose meter kit and supplies KIT Dispense based on patient and insurance preference. Use up to four times daily as directed. 1 each 0   bumetanide (BUMEX) 1 MG tablet Take 1 mg by mouth daily.     cefadroxil (DURICEF) 500 MG capsule Take 1 capsule (500 mg total) by mouth 2 (two) times daily. 180 capsule 3   Cholecalciferol (VITAMIN D3) 50 MCG (2000 UT) capsule Take 1 capsule (2,000 Units total) by mouth daily. 90 capsule 3   colchicine 0.6 MG tablet Take 2 tablets at once then take 1 tablet an  hour later then then 1 tablet twice daily x 3 days. 30 tablet 2   Continuous Blood Gluc Sensor (FREESTYLE LIBRE 3 SENSOR) MISC 1 each by Does not apply route daily. Place 1 sensor on the skin every 14 days. Use to check glucose continuously 2 each 11   doxycycline (ADOXA) 100 MG tablet Take 1 tablet (100 mg total) by mouth 2 (two) times daily. 14 tablet 0   DULoxetine (CYMBALTA) 60  MG capsule TAKE 1 CAPSULE(60 MG) BY MOUTH DAILY 90 capsule 0   Fluticasone-Umeclidin-Vilant ([name] ELLIPTA) 100-62.5-25 MCG/ACT AEPB Inhale 1 puff into the lungs daily. 90 each 1   levothyroxine (SYNTHROID) 75 MCG tablet TAKE 1 TABLET(75 MCG) BY MOUTH DAILY BEFORE BREAKFAST 90 tablet 1   Magnesium Gluconate 250 MG TABS Take 1 tablet by mouth daily.     metoprolol tartrate (LOPRESSOR) 25 MG tablet Take 1 tablet (25 mg total) by mouth 2 (two) times daily. 180 tablet 3   Multiple Vitamins-Minerals (EMERGEN-C IMMUNE PLUS) PACK Take 1 tablet by mouth 2 (two) times daily. 10 each 0   OXYGEN Inhale 2 L into the lungs.     primidone (MYSOLINE) 50 MG tablet TAKE 1 TABLET BY MOUTH TWICE DAILY 180 tablet 3   Probiotic Product (PROBIOTIC DAILY PO) Take 1 tablet by mouth daily.     Propylene Glycol (SYSTANE BALANCE OP) Place 1 drop into both eyes 4 (four) times daily as needed (dry eyes).     rOPINIRole (REQUIP) 1 MG tablet TAKE 1 TABLET(1 MG) BY MOUTH AT BEDTIME 90 tablet 0   Semaglutide, 1 MG/DOSE, 4 MG/3ML SOPN Inject 1 mg as directed once a week. 3 mL 2   topiramate (TOPAMAX) 50 MG tablet Take 1 tablet (50 mg total) by mouth at bedtime. 90 tablet 3   triamcinolone ointment (KENALOG) 0.1 % Apply 1 Application topically 2 (two) times daily.     diazepam (VALIUM) 10 MG tablet Take 1 tablet (10 mg total) by mouth every 6 (six) hours as needed for anxiety. (Patient not taking: Reported on 03/23/2022) 30 tablet 0   HYDROcodone-acetaminophen (NORCO/VICODIN) 5-325 MG tablet Take 1 tablet by mouth every 4 (four) hours as needed for  moderate pain. (Patient not taking: Reported on 03/23/2022) 20 tablet 0   ondansetron (ZOFRAN) 4 MG tablet Take 1 tablet (4 mg total) by mouth every 8 (eight) hours as needed for nausea or vomiting. (Patient not taking: Reported on 03/23/2022) 20 tablet 0   polyethylene glycol powder (GLYCOLAX/MIRALAX) 17 GM/SCOOP powder Take 17 g by mouth daily as needed for moderate constipation. (Patient not taking: Reported on 03/23/2022)     promethazine-dextromethorphan (PROMETHAZINE-DM) 6.25-15 MG/5ML syrup Take 5 mLs by mouth 4 (four) times daily as needed for cough. (Patient not taking: Reported on 03/23/2022) 120 mL 0   No current facility-administered medications on file prior to visit.     Review of Systems As in subjective    Objective:   Physical Exam Due to coronavirus pandemic stay at home measures, patient visit was virtual and they were not examined in person.   Pulse 80   Ht 5' 7.5" (1.715 m)   Wt 218 lb (98.9 kg)   SpO2 98%   BMI 33.64 kg/m   Gen: wd, wn, nad, seated on her couch, answers questions appropriately, has nasal cannula oxygen in place     Assessment:     Encounter Diagnoses  Name Primary?   Chronic diastolic CHF (congestive heart failure) (HCC) Yes   Type II diabetes mellitus with complication (HCC)    PVC's (premature ventricular contractions)    Panlobular emphysema (HCC)    Chronic hypoxemic respiratory failure (HCC)    Other specified hypothyroidism    Stage 3a chronic kidney disease ([place])    Dizziness    Fall, initial encounter    Estrogen deficiency    Postmenopausal    Polypharmacy    Morbid obesity (HCC)    Idiopathic gout, unspecified chronicity,  unspecified site    Mixed dyslipidemia        Plan:      We discussed her symptoms and concerns.  We discussed limitations of virtual consult.  Discussed possible causes of dizziness.  I strongly suspect her blood pressures are running too low and likely her pulse as well given her weight loss with the  GLP-1 medication.  I also think her blood pressure and pulse machine may not be completely accurate particular the pulse ox.  They will monitor blood pressure and pulse for the next few days and call back in 2 days with some readings.  Advise she stop metoprolol for now since she is getting some readings under 50 with pulse.  She continues on Bumex daily for edema and feels like this works well for her.  I last had an in person visit with her in September and her pulse and blood pressure was fine then but she has lost close to 20 more pounds since that visit.  She is not getting any low sugar readings thankfully.  I will go and refer back to cardiology and general we will try to get her in with them sooner  She is due for some other follow-up with lipids since she was changed to Vascepa earlier this year.  Her allopurinol was also increased back in April.  She is due for a DEXA bone density scan as well.  We will try to have her come in for labs in the near future    Rubina was seen today for other.  Diagnoses and all orders for this visit:  Chronic diastolic CHF (congestive heart failure) (HCC) -     Brain natriuretic peptide; Future  Type II diabetes mellitus with complication (HCC) -     Hemoglobin A1c; Future  PVC's (premature ventricular contractions)  Panlobular emphysema (HCC)  Chronic hypoxemic respiratory failure (HCC)  Other specified hypothyroidism  Stage 3a chronic kidney disease (HCC) -     Basic metabolic panel; Future  Dizziness -     CBC; Future -     TSH + free T4; Future -     Basic metabolic panel; Future  Fall, initial encounter  Estrogen deficiency -     DG Bone Density; Future  Postmenopausal -     DG Bone Density; Future  Polypharmacy  Morbid obesity (HCC)  Idiopathic gout, unspecified chronicity, unspecified site -     Uric acid; Future  Mixed dyslipidemia -     Lipid panel; Future    F/u with call back Friday on BP and pulse readings,  f/u soon for fasting labs.

## 2022-03-25 ENCOUNTER — Telehealth: Payer: Self-pay | Admitting: Medical

## 2022-03-25 NOTE — Telephone Encounter (Signed)
Pt calling back   12/6 pulse ox 44/75    12.pm     bp 105/94 12/6  52/80  1pm 12/6   78/90  3 pm  bp 108/99 12/6   69/78   5:30 pm     12/6    bp  102/89  7:30 pm 12/6  bp 120/88  11:00 pm  12/7  pulse ox  54/85  8 am     12/7  pulse ox 72/79  bp  102/72 12/7   pulse 60/99      11:30 am 12/7    pulse ox  35/91   bp 101/68  12:45 ( had dizzy spell and fell but fell on bed) 12/7 Pulse ox 78/96 Bp  109/8?   2:00 pm 12/7    Pulse ox 119/98  5:30 pm 12/7  pulse ox 79/94  11:00 pm no bp  12/8 pulse ox 99/88  9 am  bp  118/92 12/8  pulse ox  67/96  11 am  no bp reading 12/8 pulse ox 138/97  3 pm  no bp ( having issues with bp cuff)  She stopped taking bp meds on 12/5 per your instructions. She is not feeling as dizzy as she was

## 2022-03-31 ENCOUNTER — Telehealth: Payer: Self-pay | Admitting: Medical

## 2022-03-31 NOTE — Telephone Encounter (Signed)
Pt called for refills of vicodin 5-325 mg and valium 10 mg. Please send to [facility] at [place]. She can be reached at [phone].

## 2022-04-01 ENCOUNTER — Other Ambulatory Visit: Payer: Self-pay | Admitting: Medical

## 2022-04-01 NOTE — Telephone Encounter (Signed)
Pt advised.

## 2022-04-05 ENCOUNTER — Other Ambulatory Visit: Payer: Self-pay | Admitting: Physical Medicine and Rehabilitation

## 2022-04-06 DIAGNOSIS — J449 Chronic obstructive pulmonary disease, unspecified: Secondary | ICD-10-CM | POA: Diagnosis not present

## 2022-04-06 NOTE — Telephone Encounter (Signed)
In Dr. Ranell Patrick absence can you address this. Refill request for Diazepam #30 last filled on 08/10/21.

## 2022-04-07 ENCOUNTER — Ambulatory Visit (INDEPENDENT_AMBULATORY_CARE_PROVIDER_SITE_OTHER): Payer: PPO | Admitting: Medical

## 2022-04-07 ENCOUNTER — Encounter: Payer: Self-pay | Admitting: Medical

## 2022-04-07 VITALS — BP 100/60 | HR 96 | Ht 68.0 in | Wt 222.0 lb

## 2022-04-07 DIAGNOSIS — R609 Edema, unspecified: Secondary | ICD-10-CM

## 2022-04-07 DIAGNOSIS — I872 Venous insufficiency (chronic) (peripheral): Secondary | ICD-10-CM | POA: Diagnosis not present

## 2022-04-07 DIAGNOSIS — Z780 Asymptomatic menopausal state: Secondary | ICD-10-CM

## 2022-04-07 DIAGNOSIS — Q845 Enlarged and hypertrophic nails: Secondary | ICD-10-CM | POA: Insufficient documentation

## 2022-04-07 DIAGNOSIS — E782 Mixed hyperlipidemia: Secondary | ICD-10-CM | POA: Diagnosis not present

## 2022-04-07 DIAGNOSIS — R42 Dizziness and giddiness: Secondary | ICD-10-CM | POA: Diagnosis not present

## 2022-04-07 DIAGNOSIS — M359 Systemic involvement of connective tissue, unspecified: Secondary | ICD-10-CM | POA: Insufficient documentation

## 2022-04-07 DIAGNOSIS — E2839 Other primary ovarian failure: Secondary | ICD-10-CM | POA: Diagnosis not present

## 2022-04-07 DIAGNOSIS — I5032 Chronic diastolic (congestive) heart failure: Secondary | ICD-10-CM | POA: Diagnosis not present

## 2022-04-07 DIAGNOSIS — E118 Type 2 diabetes mellitus with unspecified complications: Secondary | ICD-10-CM | POA: Diagnosis not present

## 2022-04-07 DIAGNOSIS — N1831 Chronic kidney disease, stage 3a: Secondary | ICD-10-CM | POA: Diagnosis not present

## 2022-04-07 DIAGNOSIS — M1000 Idiopathic gout, unspecified site: Secondary | ICD-10-CM | POA: Diagnosis not present

## 2022-04-07 DIAGNOSIS — J431 Panlobular emphysema: Secondary | ICD-10-CM

## 2022-04-07 DIAGNOSIS — L989 Disorder of the skin and subcutaneous tissue, unspecified: Secondary | ICD-10-CM

## 2022-04-07 DIAGNOSIS — M47816 Spondylosis without myelopathy or radiculopathy, lumbar region: Secondary | ICD-10-CM

## 2022-04-07 DIAGNOSIS — E038 Other specified hypothyroidism: Secondary | ICD-10-CM

## 2022-04-07 DIAGNOSIS — G894 Chronic pain syndrome: Secondary | ICD-10-CM

## 2022-04-07 DIAGNOSIS — M4626 Osteomyelitis of vertebra, lumbar region: Secondary | ICD-10-CM

## 2022-04-07 DIAGNOSIS — Z7185 Encounter for immunization safety counseling: Secondary | ICD-10-CM

## 2022-04-07 NOTE — Progress Notes (Signed)
Subjective:  Brandi Stephens is a 76 y.o. female who presents for Chief Complaint  Patient presents with   Leg Swelling    Left leg and foot swelling. Also needs fasting labs.      Here for in person visit we did a virtual visit on 03/23/2022  She is here for fasting labs.  This was discussed on her virtual consult  She has thickened toenails that needs to nail care.  She is agreeable to see a podiatrist  Her main concern today is leg swelling.  She is on Bumex daily in general for edema but the swelling has been a little worse of late.  She also has some new skin lesions bumps on her feet bilaterally since the swelling has been worse.  She has compression hose but it is really hard for her to get them on given her chronic back pain and immobility  Moving 04/13/22 to [facility], temporarily to [employer], but trying to find a different house here in [place] that is 1 level.   She continues on semaglutide that she started several months ago.  She was over 260 pounds earlier this year.  She is really happy with the weight loss she has maintained.  On her recent virtual visit we stopped metoprolol as her blood pressures were running too low.  She was having some dizziness but she is still abstaining from metoprolol  She has her freestyle [place] app with her today showing 90 days 100% in range, 9 low events in 90 days, none in last 30 days  She is requesting refills on Valium and hydrocodone.  She has been seeing pain specialist but cannot get into them.  She is booked out and despite trying to get an appointment, has not had success in communicating with her pain specialist.  Hydrocodone has not been helping and her last refill was in April.  She does not use this very regularly but would like a refill  Regarding the skin lesions on her feet, she sees dermatology but she only had 1 lesion at her last visit with dermatology She has several new wounds since the swelling has been  worse.  Last smoking 4 year sog  No other aggravating or relieving factors.    No other c/o.  The following portions of the patient's history were reviewed and updated as appropriate: allergies, current medications, past family history, past medical history, past social history, past surgical history and problem list.  ROS Otherwise as in subjective above  Objective: BP 100/60   Pulse 96   Ht '5\' 8"'$  (1.727 m)   Wt 222 lb (100.7 kg)   BMI 33.75 kg/m   Wt Readings from Last 3 Encounters:  04/07/22 222 lb (100.7 kg)  03/23/22 218 lb (98.9 kg)  03/18/22 218 lb (98.9 kg)   BP Readings from Last 3 Encounters:  04/07/22 100/60  01/05/22 124/80  12/31/21 100/60   General appearance: alert, no distress, well developed, well nourished, on oxygen Skin: She has several small pinkish 4 mm to 5 mm round somewhat stuck on appearing lesions of her dorsal feet bilaterally, there is a larger 1 notes approximately 2 cm diameter on her right dorsal lateral foot that has been there for months, she also has hypertrophic and yellowish toenails bilaterally throughout Neck: supple, no lymphadenopathy, no thyromegaly, no masses, no JVD or bruit Heart: RRR, normal S1, S2, no murmurs Lungs: Relatively clear to, no wheezes, rhonchi, or rales Pulses: 2+ radial pulses,  2+ pedal pulses, normal cap refill Ext: Left lower leg and ankle swollen more than right but both nonpitting, but she notes this is chronic, worse of late than  Diabetic Foot Exam - Simple   Simple Foot Form Diabetic Foot exam was performed with the following findings: Yes 04/07/2022  9:43 AM  Visual Inspection See comments: Yes Sensation Testing Intact to touch and monofilament testing bilaterally: Yes Pulse Check Posterior Tibialis and Dorsalis pulse intact bilaterally: Yes Comments Thickened pinkish round plaques on bilateral feet most are around 4 to 5 mm diameter but some more a centimeter plus diameter        Assessment: Encounter Diagnoses  Name Primary?   Edema, unspecified type Yes   Type II diabetes mellitus with complication (HCC)    Estrogen deficiency    Postmenopausal    Idiopathic gout, unspecified chronicity, unspecified site    Mixed dyslipidemia    Chronic diastolic CHF (congestive heart failure) (HCC)    Stage 3a chronic kidney disease (HCC)    Dizziness    Enlarged and hypertrophic nails    Chronic venous insufficiency    Autoimmune disease (HCC)    Skin lesion    Vaccine counseling    Panlobular emphysema ([place])    Other specified hypothyroidism    Lumbar spondylosis    Osteomyelitis of lumbar spine (HCC)    Chronic pain syndrome      Plan: Diabetes-updated labs today.  She has done well on semaglutide for the last several months and has lost considerable weight.  I reviewed her recent freestyle libre readings as noted above.  She is at goal.  Chronic pain-I will reach out to her pain specialist.  She notes that she is having trouble communicating with the pain clinic and getting in for a visit.  I do not feel comfortable managing her pain.  She notes that the hydrocodone is not helping.  She wants a refill of Valium.  She is already on other medications that complicate the this situation.  She is on Topamax for pain, she is on Cymbalta for mood and pain and neuropathy, she is on primidone for tremor, she is on Requip for restless leg, amitriptyline for sleep but given some of the overlap of these medicines and given multiple medications polypharmacy, I would rather not add anything but rather possibly remove something if we are modifying her pain regimen.  She wasn't too keen on taking anything away  Edema-on Bumex daily.  Encouraged leg elevation, encourage walking for exercise, encouraged compression hose.  Pending labs we might change up the diuretic for the next month  Hypertension-we recently stopped metoprolol due to low readings and she is improved in this  regard.  Routine labs today  Dyslipidemia-continue current medications, labs today  CKD 3-routine labs today  I reviewed her vaccines.  She is up-to-date currently on flu and pneumonia shingles and tetanus vaccines  Gout-continue allopurinol, updated labs today  Skin lesions likely related to autoimmune disorder-we will get a copy of last dermatology note.  She does have triamcinolone cream she will use for this.  The 1 lesion has been managed by dermatology but she has similar new wounds.  I recommend she do a follow-up with dermatology  Hypertrophic toenails-referral to podiatry  Hypothyroidism-updated labs today, continue current medication  COPD-she sees pulmonology, compliant with oxygen and inhalers    Danniell was seen today for leg swelling.  Diagnoses and all orders for this visit:  Edema, unspecified type  Type II  diabetes mellitus with complication (HCC) -     Hemoglobin A1c  Estrogen deficiency -     DG Bone Density  Postmenopausal -     DG Bone Density  Idiopathic gout, unspecified chronicity, unspecified site -     Uric acid  Mixed dyslipidemia -     Lipid panel  Chronic diastolic CHF (congestive heart failure) (HCC) -     Brain natriuretic peptide  Stage 3a chronic kidney disease (HCC) -     Basic metabolic panel  Dizziness -     Basic metabolic panel -     TSH + free T4 -     CBC  Enlarged and hypertrophic nails -     Ambulatory referral to Podiatry  Chronic venous insufficiency  Autoimmune disease (HCC)  Skin lesion  Vaccine counseling  Panlobular emphysema (HCC)  Other specified hypothyroidism  Lumbar spondylosis  Osteomyelitis of lumbar spine (HCC)  Chronic pain syndrome    Follow up: pending labs, referral

## 2022-04-08 ENCOUNTER — Other Ambulatory Visit: Payer: Self-pay | Admitting: Medical

## 2022-04-08 MED ORDER — LEVOTHYROXINE SODIUM 88 MCG PO TABS: 88.0000 ug | ORAL_TABLET | Freq: Every day | ORAL | 3 refills | Status: DC

## 2022-04-08 NOTE — Progress Notes (Signed)
Results sent through MyChart

## 2022-04-09 LAB — BASIC METABOLIC PANEL
BUN/Creatinine Ratio: 26 (ref 12–28)
BUN: 22 mg/dL (ref 8–27)
CO2: 24 mmol/L (ref 20–29)
Calcium: 10.4 mg/dL — ABNORMAL HIGH (ref 8.7–10.3)
Chloride: 104 mmol/L (ref 96–106)
Creatinine, Ser: 0.84 mg/dL (ref 0.57–1.00)
Glucose: 94 mg/dL (ref 70–99)
Potassium: 4.1 mmol/L (ref 3.5–5.2)
Sodium: 143 mmol/L (ref 134–144)
eGFR: 72 mL/min/{1.73_m2} (ref >59)

## 2022-04-09 LAB — URIC ACID: Uric Acid: 7.4 mg/dL (ref 3.1–7.9)

## 2022-04-09 LAB — LIPID PANEL
Chol/HDL Ratio: 2.8 ratio (ref 0.0–4.4)
Cholesterol, Total: 151 mg/dL (ref 100–199)
HDL: 54 mg/dL (ref >39)
LDL Chol Calc (NIH): 67 mg/dL (ref 0–99)
Triglycerides: 183 mg/dL — ABNORMAL HIGH (ref 0–149)
VLDL Cholesterol Cal: 30 mg/dL (ref 5–40)

## 2022-04-09 LAB — CBC
Hematocrit: 37.7 % (ref 34.0–46.6)
Hemoglobin: 12.4 g/dL (ref 11.1–15.9)
MCH: 31.1 pg (ref 26.6–33.0)
MCHC: 32.9 g/dL (ref 31.5–35.7)
MCV: 95 fL (ref 79–97)
Platelets: 250 10*3/uL (ref 150–450)
RBC: 3.99 x10E6/uL (ref 3.77–5.28)
RDW: 13.9 % (ref 11.7–15.4)
WBC: 8.4 10*3/uL (ref 3.4–10.8)

## 2022-04-09 LAB — TSH+FREE T4
Free T4: 1.04 ng/dL (ref 0.82–1.77)
TSH: 4.86 u[IU]/mL — ABNORMAL HIGH (ref 0.450–4.500)

## 2022-04-09 LAB — BRAIN NATRIURETIC PEPTIDE: BNP: 55.2 pg/mL (ref 0.0–100.0)

## 2022-04-09 LAB — HEMOGLOBIN A1C
Est. average glucose Bld gHb Est-mCnc: 120 mg/dL
Hgb A1c MFr Bld: 5.8 % — ABNORMAL HIGH (ref 4.8–5.6)

## 2022-04-10 NOTE — Progress Notes (Signed)
Results sent through MyChart

## 2022-04-19 ENCOUNTER — Ambulatory Visit: Payer: PPO | Admitting: Physical Medicine and Rehabilitation

## 2022-04-21 ENCOUNTER — Telehealth: Payer: Self-pay | Admitting: Medical

## 2022-04-21 ENCOUNTER — Other Ambulatory Visit: Payer: Self-pay | Admitting: Medical

## 2022-04-21 MED ORDER — TRELEGY ELLIPTA 100-62.5-25 MCG/ACT IN AEPB: 1.0000 | INHALATION_SPRAY | Freq: Every day | RESPIRATORY_TRACT | 3 refills | Status: DC

## 2022-04-21 MED ORDER — TRELEGY ELLIPTA 100-62.5-25 MCG/ACT IN AEPB: 1.0000 | INHALATION_SPRAY | Freq: Every day | RESPIRATORY_TRACT | 1 refills | Status: DC

## 2022-04-21 NOTE — Telephone Encounter (Signed)
Completed assistance form for [name],  [name] I need a printed Rx for 90 days with 3 refills.

## 2022-04-22 ENCOUNTER — Telehealth: Payer: Self-pay | Admitting: Pharmacist

## 2022-04-22 NOTE — Progress Notes (Cosign Needed)
Patient ID: Brandi Stephens, female   DOB: 1945/09/24, 77 y.o.   MRN: [id number]  Call to patient per MP to request that she send in or drop off her Proof of income for her patient assistance forms. Did not reach left her a detailed message including my contact information in the event she has any questions.    [facility] Clinical Pharmacist Assistant [phone]

## 2022-04-28 NOTE — Telephone Encounter (Signed)
Pt came in for appt. 

## 2022-05-05 ENCOUNTER — Telehealth: Payer: Self-pay | Admitting: Medical

## 2022-05-05 NOTE — Telephone Encounter (Signed)
Pt called & states she will send daughter by with income for [place]

## 2022-05-12 ENCOUNTER — Ambulatory Visit: Payer: PPO | Admitting: Internal Medicine

## 2022-05-13 ENCOUNTER — Telehealth: Payer: Self-pay | Admitting: Internal Medicine

## 2022-05-13 DIAGNOSIS — Y939 Activity, unspecified: Secondary | ICD-10-CM | POA: Diagnosis not present

## 2022-05-13 DIAGNOSIS — Z5329 Procedure and treatment not carried out because of patient's decision for other reasons: Secondary | ICD-10-CM | POA: Diagnosis not present

## 2022-05-13 DIAGNOSIS — S299XXA Unspecified injury of thorax, initial encounter: Secondary | ICD-10-CM | POA: Diagnosis not present

## 2022-05-13 DIAGNOSIS — R519 Headache, unspecified: Secondary | ICD-10-CM | POA: Diagnosis not present

## 2022-05-13 DIAGNOSIS — R7989 Other specified abnormal findings of blood chemistry: Secondary | ICD-10-CM | POA: Diagnosis not present

## 2022-05-13 DIAGNOSIS — Z9181 History of falling: Secondary | ICD-10-CM | POA: Diagnosis not present

## 2022-05-13 DIAGNOSIS — S0990XA Unspecified injury of head, initial encounter: Secondary | ICD-10-CM | POA: Diagnosis not present

## 2022-05-13 DIAGNOSIS — R42 Dizziness and giddiness: Secondary | ICD-10-CM | POA: Diagnosis not present

## 2022-05-13 DIAGNOSIS — S060XAA Concussion with loss of consciousness status unknown, initial encounter: Secondary | ICD-10-CM | POA: Diagnosis not present

## 2022-05-13 DIAGNOSIS — Z9981 Dependence on supplemental oxygen: Secondary | ICD-10-CM | POA: Diagnosis not present

## 2022-05-13 DIAGNOSIS — Z636 Dependent relative needing care at home: Secondary | ICD-10-CM | POA: Diagnosis not present

## 2022-05-13 DIAGNOSIS — R000 Tachycardia, unspecified: Secondary | ICD-10-CM | POA: Diagnosis not present

## 2022-05-13 DIAGNOSIS — R0602 Shortness of breath: Secondary | ICD-10-CM | POA: Diagnosis not present

## 2022-05-13 DIAGNOSIS — W1830XA Fall on same level, unspecified, initial encounter: Secondary | ICD-10-CM | POA: Diagnosis not present

## 2022-05-13 DIAGNOSIS — S069X9A Unspecified intracranial injury with loss of consciousness of unspecified duration, initial encounter: Secondary | ICD-10-CM | POA: Diagnosis not present

## 2022-05-13 DIAGNOSIS — Y92009 Unspecified place in unspecified non-institutional (private) residence as the place of occurrence of the external cause: Secondary | ICD-10-CM | POA: Diagnosis not present

## 2022-05-13 DIAGNOSIS — E039 Hypothyroidism, unspecified: Secondary | ICD-10-CM | POA: Diagnosis not present

## 2022-05-13 NOTE — Telephone Encounter (Signed)
Pt was notified.  

## 2022-05-13 NOTE — Telephone Encounter (Signed)
[place] [facility] [place]. She has fallen 6 times this week due to dizziness. She thought it was BP when she saw you. She wants to know is there anything that you recommend for her. She says she never knows when she is going to fall as she gets so swimmy head and its more on her right side.

## 2022-05-17 ENCOUNTER — Telehealth (INDEPENDENT_AMBULATORY_CARE_PROVIDER_SITE_OTHER): Payer: Medicare HMO | Admitting: Internal Medicine

## 2022-05-17 ENCOUNTER — Encounter: Payer: Self-pay | Admitting: Internal Medicine

## 2022-05-17 ENCOUNTER — Other Ambulatory Visit: Payer: Self-pay

## 2022-05-17 VITALS — BP 94/68 | Resp 16 | Ht 68.0 in | Wt 188.0 lb

## 2022-05-17 DIAGNOSIS — M4626 Osteomyelitis of vertebra, lumbar region: Secondary | ICD-10-CM

## 2022-05-17 NOTE — Progress Notes (Signed)
Virtual Visit via Video Note  I connected with Brandi Stephens on 05/17/22 at  3:00 PM EST by a video enabled telemedicine application and verified that I am speaking with the correct person using two identifiers.  Location: Patient: Home Provider: [facility]   I discussed the limitations of evaluation and management by telemedicine and the availability of in person appointments. The patient expressed understanding and agreed to proceed.  History of Present Illness: I conducted a video visit with Brandi Stephens today.  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  She has had no problems tolerating her antibiotic, specifically no nausea, vomiting or diarrhea.  She has chronic back pain.  She and her husband recently decided to sell their 4000 square foot house here in [place].  They are currently living at their beach house in [place], [place].  They are hoping to move back to a smaller, one-story home here in [place] soon.  She continues to take Ozempic and has lost a little over 100 pounds in the past year.  Her chronic back pain is largely unchanged.  She has had some recent syncopal episodes and falls which cause some transient increase in back pain.   Observations/Objective: Lab Results  Component Value Date   CRP 36.5 (H) 08/10/2021    Assessment and Plan: At this point I think it is best that she stay on chronic, suppressive cefadroxil.  I will try to arrange an in person visit here in 2 months for blood work and further discussion of management options.  Follow Up Instructions: Continue cefadroxil Follow-up here in 2 months   I discussed the assessment and treatment plan with the patient. The patient was provided an opportunity to ask questions and all were answered. The patient agreed with the plan and demonstrated an understanding of the instructions.   The  patient was advised to call back or seek an in-person evaluation if the symptoms worsen or if the condition fails to improve as anticipated.  I provided 18 minutes of non-face-to-face time during this encounter.   [name], MD

## 2022-05-19 ENCOUNTER — Telehealth: Payer: Self-pay

## 2022-05-19 NOTE — Progress Notes (Cosign Needed)
A user error has taken place: encounter opened in error, closed for administrative reasons.

## 2022-05-25 NOTE — Telephone Encounter (Signed)
PT ASSISTANCE OZEMPIC 2024 received, pt informed

## 2022-05-30 ENCOUNTER — Telehealth: Payer: Self-pay | Admitting: Internal Medicine

## 2022-05-30 NOTE — Telephone Encounter (Signed)
STAT if patient feels like he/she is going to faint   Are you dizzy now?   No  Do you feel faint or have you passed out?   Passed out yesterday  Do you have any other symptoms?  Ringing sensation in both ears, legs get "wiggly"  Have you checked your HR and BP (record if available)?    Patient stated she has been having dizzy spells and have fallen several times.  Patient stated her HR has been staying at 110 most of the time and she has a pulse ox.  Patient states she wears oxygen.

## 2022-05-30 NOTE — Telephone Encounter (Signed)
Pt stated she went to ED in [place] ([facility] on 2/2 due to syncope and hitting back of head. Left message for med rec to call back. She fell twice over the weekend. One fall was syncope, while using walker.  While on phone, BP 116/69, P 121. Pt reports she has vertigo,and has never been to ENT. She drinks 4-6 (12 ounce) bottles of water each day.  Due to syncope and heart rate, recommended she go back to the ED. She has 2/20 appointment with E. Monge.

## 2022-06-06 ENCOUNTER — Encounter: Payer: Medicare HMO | Admitting: Physical Medicine and Rehabilitation

## 2022-06-07 ENCOUNTER — Telehealth: Payer: Self-pay | Admitting: Internal Medicine

## 2022-06-07 ENCOUNTER — Encounter: Payer: Self-pay | Admitting: Nurse Practitioner

## 2022-06-07 ENCOUNTER — Ambulatory Visit: Payer: Medicare HMO | Admitting: Medical

## 2022-06-07 ENCOUNTER — Ambulatory Visit: Payer: Medicare HMO | Attending: Nurse Practitioner | Admitting: Nurse Practitioner

## 2022-06-07 VITALS — HR 94 | Wt 184.0 lb

## 2022-06-07 VITALS — BP 98/58 | HR 123 | Ht 68.0 in | Wt 184.4 lb

## 2022-06-07 DIAGNOSIS — J9611 Chronic respiratory failure with hypoxia: Secondary | ICD-10-CM | POA: Diagnosis not present

## 2022-06-07 DIAGNOSIS — J431 Panlobular emphysema: Secondary | ICD-10-CM

## 2022-06-07 DIAGNOSIS — E038 Other specified hypothyroidism: Secondary | ICD-10-CM | POA: Diagnosis not present

## 2022-06-07 DIAGNOSIS — I10 Essential (primary) hypertension: Secondary | ICD-10-CM

## 2022-06-07 DIAGNOSIS — I5032 Chronic diastolic (congestive) heart failure: Secondary | ICD-10-CM | POA: Diagnosis not present

## 2022-06-07 DIAGNOSIS — E118 Type 2 diabetes mellitus with unspecified complications: Secondary | ICD-10-CM

## 2022-06-07 DIAGNOSIS — W19XXXD Unspecified fall, subsequent encounter: Secondary | ICD-10-CM | POA: Diagnosis not present

## 2022-06-07 DIAGNOSIS — R42 Dizziness and giddiness: Secondary | ICD-10-CM

## 2022-06-07 DIAGNOSIS — R002 Palpitations: Secondary | ICD-10-CM

## 2022-06-07 DIAGNOSIS — N1831 Chronic kidney disease, stage 3a: Secondary | ICD-10-CM | POA: Diagnosis not present

## 2022-06-07 DIAGNOSIS — E782 Mixed hyperlipidemia: Secondary | ICD-10-CM

## 2022-06-07 DIAGNOSIS — Z79899 Other long term (current) drug therapy: Secondary | ICD-10-CM | POA: Diagnosis not present

## 2022-06-07 DIAGNOSIS — R55 Syncope and collapse: Secondary | ICD-10-CM | POA: Diagnosis not present

## 2022-06-07 DIAGNOSIS — R000 Tachycardia, unspecified: Secondary | ICD-10-CM

## 2022-06-07 MED ORDER — METOPROLOL TARTRATE 25 MG PO TABS: 12.5000 mg | ORAL_TABLET | Freq: Two times a day (BID) | ORAL | 3 refills | Status: DC

## 2022-06-07 NOTE — Progress Notes (Unsigned)
Office Visit    Patient Name: Brandi Stephens Date of Encounter: 06/07/2022  Primary Care Provider:  Carlena Hurl, PA-C Primary Cardiologist:  Janina Mayo, MD  Chief Complaint    77 year old female with a history of atypical chest pain, palpitations, sinus tachycardia, chronic diastolic heart failure, hypertension, hyperlipidemia, hypothyroidism, prediabetes, COPD, tobacco use, left breast cancer s/p lumpectomy and radiation, osteomyelitis, and chronic pain in the setting of severe lumbar disc degeneration who presents for follow-up related to heart failure, palpitations and syncope.   Past Medical History    Past Medical History:  Diagnosis Date   AIN (anal intraepithelial neoplasia) anal canal    Anemia    with pregnancy   Anxiety    Arthritis    knees, lower back, knees   Chronic constipation    Chronic diastolic CHF (congestive heart failure) ([place]) 07/05/2019   CKD (chronic kidney disease)    Coarse tremors    Essential   COPD (chronic obstructive pulmonary disease) ([place])    2017 chest CT, history of   Degenerative lumbar spinal stenosis    Depression    Depression 07/05/2019   Drug dependency ([place])    Elevated PTHrP level 05/09/2019   ETOH abuse 07/05/2019   GERD (gastroesophageal reflux disease)    pt denies   Gout    Grade I diastolic dysfunction 0000000   Noted ECHO   Hemorrhoids    History of adenomatous polyp of colon    History of basal cell carcinoma (BCC) excision 2015   nose   History of sepsis 05/14/2014   post lumbar surgery   History of shingles    x2   Hyperlipidemia    Hypertension    Hypothyroidism    Irregular heart beat    history of while undergoing radiation   Malignant neoplasm of upper-inner quadrant of left breast in female, estrogen receptor positive [facility]) oncologist-  dr Jana Hakim--  per lov in epic no recurrence   dx 07-13-2015  Left breast invasive ductal carcinoma, Stage IA, Grade 1 (TXN0),  09-16-2015  s/p  left breast  lumpectomy with sln bx's,  completed radiation 11-18-2015,  started antiestrogen therapy 12-14-2015   Neuropathy    Left foot and back of left leg   Obesity (BMI 30-39.9) 07/05/2019   Personal history of radiation therapy    completed 11-18-2015  left breast   Pneumonia    PONV (postoperative nausea and vomiting)    Prediabetes    diet controlled, no med   Restless leg syndrome    Seasonal allergies    Seizure ([place]) 05/2015   due to sepsis, only one time episode   Tremor    Wears partial dentures    lower    Past Surgical History:  Procedure Laterality Date   ABDOMINAL HYSTERECTOMY  1985   BASAL CELL CARCINOMA EXCISION  10/15   nose   BREAST BIOPSY Right 08/24/2015   BREAST BIOPSY Right 07/13/2015   BREAST BIOPSY Left 07/13/2015   BREAST EXCISIONAL BIOPSY Left 1972   BREAST LUMPECTOMY Left    BREAST LUMPECTOMY WITH RADIOACTIVE SEED AND SENTINEL LYMPH NODE BIOPSY Left 09/16/2015   Procedure: BREAST LUMPECTOMY WITH RADIOACTIVE SEED AND SENTINEL LYMPH NODE BIOPSY;  Surgeon: Stark Klein, MD;  Location: [place];  Service: General;  Laterality: Left;   BREAST REDUCTION SURGERY Bilateral 1998   CATARACT EXTRACTION W/ INTRAOCULAR LENS IMPLANT Left 2016   [place]   hallo   COLONOSCOPY  01/2013   polyps   EYE SURGERY Left 2016   HAND SURGERY Left 02-19-2002   dr Fredna Dow  @[facility]$    repair collateral ligament/ MPJ left thumb   HARDWARE REMOVAL N/A 07/24/2019   Procedure: REVISION OF HARDWARE Lumbar two - Sacral one. Extention of Fusion to Lumbar one;  Surgeon: [name], MD;  Location: [place];  Service: Neurosurgery;  Laterality: N/A;   HEMORRHOID SURGERY  2008   HIGH RESOLUTION ANOSCOPY N/A 02/28/2018   Procedure: HIGH RESOLUTION ANOSCOPY WITH BIOPSY;  Surgeon: Leighton Ruff, MD;  Location: [place];  Service: General;  Laterality: N/A;   KNEE ARTHROSCOPY Right 2002   LUMBAR SPINE SURGERY  03-20-2015   dr Kathyrn Sheriff   fusion  L4-5, L5-S1   MOUTH SURGERY  07/14/2018   2 infected teeth removed   MULTIPLE TOOTH EXTRACTIONS     with bone grafting lower bottom right and left   REDUCTION MAMMAPLASTY Bilateral    RIGHT COLECTOMY  09-10-2003   dr Margot Chimes @[facility]$    multiple colon polyps (per path tubular adenoma's, hyperplastic , benign appendix, benign two lymph nodes   ROTATOR CUFF REPAIR Left 04/2016   TONSILLECTOMY  1969   TUBAL LIGATION Bilateral 1982    Allergies  Allergies  Allergen Reactions   Ampicillin Hives and Other (See Comments)    Severe reaction in February 2017 1.5 month to have hives to go away   Gabapentin Swelling    UNSPECIFIED REACTION    Penicillins Hives and Other (See Comments)    Severe reaction in February 2017 1.5 month to have hives to go away Has patient had a PCN reaction causing immediate rash, facial/tongue/throat swelling, SOB or lightheadedness with hypotension: #  #  #  YES  #  #  #  Has patient had a PCN reaction causing severe rash involving mucus membranes or skin necrosis: No Has patient had a PCN reaction that required hospitalization No Has patient had a PCN reaction occurring within the last 10 years: No    Other Itching    UNSPECIFIED Analgesics     Labs/Other Studies Reviewed    The following studies were reviewed today: [facility]io 26-Oct-2021: Agree with ziopatch. No triggered events.  One run of PACs.  No significant tachyarrhythmia or bradyarrhythmia. No atrial fibrillation or flutter.     Patch Wear Time:  7 days and 4 hours (2023-06-10T11:41:51-0400 to 2023-06-17T15:58:03-399)   Patient had a min HR of 56 bpm, max HR of 156 bpm, and avg HR of 86 bpm. Predominant underlying rhythm was Sinus Rhythm. 1 run of Supraventricular Tachycardia occurred lasting 9 beats with a max rate of 156 bpm (avg 123 bpm). Isolated SVEs were rare  (<1.0%), and no SVE Couplets or SVE Triplets were present. No Isolated VEs, VE Couplets, or VE Triplets were present.   Echo  01/2021: IMPRESSIONS     1. Limited echo for LV function   2. Left ventricular ejection fraction, by estimation, is 60 to 65%. The  left ventricle has normal function. There is mild left ventricular  hypertrophy. Left ventricular diastolic parameters are consistent with  Grade I diastolic dysfunction (impaired  relaxation).   3. Aortic dilatation noted. There is mild dilatation of the ascending  aorta, measuring 41 mm.   4. The inferior vena cava is dilated in size with >50% respiratory  variability, suggesting right atrial pressure of 8 mmHg.   Comparison(s): Changes from prior study are noted. 07/07/2020: LVEF 65-70%.   Myoview 12/2020:  The study is normal. The study is low risk.   No ST deviation was noted.   LV perfusion is normal.   Left ventricular function is normal. Nuclear stress EF: 60 %. The left ventricular ejection fraction is severely decreased (<30%). End diastolic cavity size is normal.   Prior study available for comparison from 01/22/2016.   Low risk stress nuclear study with normal perfusion and normal left ventricular regional and global systolic function. Recent Labs: 01/05/2022: ALT 23 04/07/2022: BNP 55.2; BUN 22; Creatinine, Ser 0.84; Hemoglobin 12.4; Platelets 250; Potassium 4.1; Sodium 143; TSH 4.860  Recent Lipid Panel    Component Value Date/Time   CHOL 151 04/07/2022 0936   TRIG 183 (H) 04/07/2022 0936   HDL 54 04/07/2022 0936   CHOLHDL 2.8 04/07/2022 0936   CHOLHDL 4.2 07/08/2016 1042   VLDL 52 (H) 07/08/2016 1042   LDLCALC 67 04/07/2022 0936    History of Present Illness    77 year old female with the above past medical history including atypical chest pain, palpitations, sinus tachycardia, chronic diastolic heart failure, hypertension,  hyperlipidemia, hypothyroidism, prediabetes, COPD, tobacco use, left breast cancer s/p lumpectomy and radiation, myelitis, and chronic pain in the setting of severe lumbar disc degeneration.  She was previously  evaluated by Dr. Debara Pickett in the setting of palpitations and chest discomfort.  Nuclear stress test was normal.  Echocardiogram showed normal LV function.  Cardiac monitor showed sinus tachycardia, occasional PVCs correlating with her symptoms.  She was managed with low-dose metoprolol.  She has a history of chronic diastolic heart failure, venous stasis, managed with Bumex.  She also has a history of chronic hypoxic respiratory failure, COPD, on home O2.  She follows with pulmonology.  She was last seen in office on 09/14/2021 was stable overall from a cardiac standpoint.  She did note ongoing palpitations.  Repeat cardiac monitor in 09/2021 showed predominantly sinus rhythm, 1 run of SVT, isolated PACs and PVCs, no significant arrhythmia.  She went to the ED at [facility] on 05/20/2022 in the setting of syncope.  She had hit the back of her head.  Records not available for review.   She presents today for follow-up.  Since her last visit and since her recent hospitalization she continues to note intermittent dizziness, lightheadedness, presyncope.  She reports symptoms of dizziness with a ringing in her ears, she feels weak in her legs and then "goes down."  Unfortunately, we did not have time to perform orthostatic vital signs today as patient was scheduled for appointment with her PCP.  Will request records from [facility].  Will do 30-day event monitor, echocardiogram.  CT of the head was reportedly unremarkable, she states that they also did a CT of the chest which showed no evidence of PE.  Will change bumetanide to as needed use.  For swelling, weight gain.  Will restart metoprolol 12.5 mg twice daily.  This was discontinued per PCP in December in the setting of hypotension.  I advised her to continue to monitor low BP.  Discussed ED precautions.  Follow-up in 2 months.  Denies chest pain she does report some dyspnea in the setting of presyncope.  She denies worsening edema, PND, orthopnea,  weight gain.  She states actually she has lost approximately 100 pounds in the past year.  She is on Ozempic.   30 day event monitor , echo, ?carotid dopplers, will request records.    EKG   Syncope: Palpitations: Chronic diastolic heart failure: Hypertension: Hyperlipidemia: COPD:  Disposition:  Home Medications    Current Outpatient Medications  Medication Sig Dispense Refill   amitriptyline (ELAVIL) 100 MG tablet Take 1 tablet (100 mg total) by mouth at bedtime. 90 tablet 3   atorvastatin (LIPITOR) 80 MG tablet Take 1 tablet (80 mg total) by mouth daily. 90 tablet 3   Biotin 3 MG TABS Take 3 mg by mouth daily.     blood glucose meter kit and supplies KIT Dispense based on patient and insurance preference. Use up to four times daily as directed. 1 each 0   bumetanide (BUMEX) 1 MG tablet Take 1 mg by mouth daily.     cefadroxil (DURICEF) 500 MG capsule Take 500 mg by mouth 2 (two) times daily.     Cholecalciferol (VITAMIN D3) 50 MCG (2000 UT) capsule Take 1 capsule (2,000 Units total) by mouth daily. 90 capsule 3   colchicine 0.6 MG tablet Take 2 tablets at once then take 1 tablet an hour later then then 1 tablet twice daily x 3 days. 30 tablet 2   Continuous Blood Gluc Sensor (FREESTYLE LIBRE 3 SENSOR) MISC 1 each by Does not apply route daily. Place 1 sensor on the skin every 14 days. Use to check glucose continuously 2 each 11   diazepam (VALIUM) 10 MG tablet TAKE 1 TABLET(10 MG) BY MOUTH EVERY 6 HOURS AS NEEDED FOR ANXIETY 30 tablet 3   DULoxetine (CYMBALTA) 60 MG capsule TAKE 1 CAPSULE(60 MG) BY MOUTH DAILY 90 capsule 0   erythromycin ophthalmic ointment Place 1 Application into the left eye 4 (four) times daily.     Fluticasone-Umeclidin-Vilant ([name] ELLIPTA) 100-62.5-25 MCG/ACT AEPB Inhale 1 puff into the lungs daily. 90 each 3   HYDROcodone-acetaminophen (NORCO/VICODIN) 5-325 MG tablet Take 1 tablet by mouth every 4 (four) hours as needed for moderate pain. 20 tablet 0    levothyroxine (SYNTHROID) 88 MCG tablet Take 1 tablet (88 mcg total) by mouth daily. 30 tablet 3   Magnesium Gluconate 250 MG TABS Take 1 tablet by mouth daily.     OXYGEN Inhale 2 L into the lungs.     polyethylene glycol powder (GLYCOLAX/MIRALAX) 17 GM/SCOOP powder Take 17 g by mouth daily as needed for moderate constipation.     primidone (MYSOLINE) 50 MG tablet TAKE 1 TABLET BY MOUTH TWICE DAILY 180 tablet 3   Probiotic Product (PROBIOTIC DAILY PO) Take 1 tablet by mouth daily.     Propylene Glycol (SYSTANE BALANCE OP) Place 1 drop into both eyes 4 (four) times daily as needed (dry eyes).     rOPINIRole (REQUIP) 1 MG tablet TAKE 1 TABLET(1 MG) BY MOUTH AT BEDTIME 90 tablet 0   Semaglutide, 1 MG/DOSE, 4 MG/3ML SOPN Inject 1 mg as directed once a week. 3 mL 2   topiramate (TOPAMAX) 50 MG tablet Take 1 tablet (50 mg total) by mouth at bedtime. 90 tablet 3   triamcinolone ointment (KENALOG) 0.1 % Apply 1 Application topically 2 (two) times daily.     metoprolol tartrate (LOPRESSOR) 25 MG tablet Take 1 tablet (25 mg total) by mouth 2 (two) times daily. (Patient not taking: Reported on 06/07/2022) 180 tablet 3   No current facility-administered medications for this visit.     Review of Systems    ***.  All other systems reviewed and are otherwise negative except as noted above.    Physical Exam    VS:  BP (!) 98/58 (BP Location: Left Arm, Patient Position: Sitting, Cuff Size: Normal) Comment (Patient  Position): wheelchair  Pulse (!) 123   Ht 5' 8"$  (1.727 m)   Wt 184 lb 6.4 oz (83.6 kg)   SpO2 93%   BMI 28.04 kg/m  GEN: Well nourished, well developed, in no acute distress. HEENT: normal. Neck: Supple, no JVD, carotid bruits, or masses. Cardiac: RRR, no murmurs, rubs, or gallops. No clubbing, cyanosis, edema.  Radials/DP/PT 2+ and equal bilaterally.  Respiratory:  Respirations regular and unlabored, clear to auscultation bilaterally. GI: Soft, nontender, nondistended, BS + x 4. MS: no  deformity or atrophy. Skin: warm and dry, no rash. Neuro:  Strength and sensation are intact. Psych: Normal affect.  Accessory Clinical Findings    ECG personally reviewed by me today - Sinus tachycardia, 122 bpm - no acute changes.   Lab Results  Component Value Date   WBC 8.4 04/07/2022   HGB 12.4 04/07/2022   HCT 37.7 04/07/2022   MCV 95 04/07/2022   PLT 250 04/07/2022   Lab Results  Component Value Date   CREATININE 0.84 04/07/2022   BUN 22 04/07/2022   NA 143 04/07/2022   K 4.1 04/07/2022   CL 104 04/07/2022   CO2 24 04/07/2022   Lab Results  Component Value Date   ALT 23 01/05/2022   AST 25 01/05/2022   ALKPHOS 101 01/05/2022   BILITOT 0.3 01/05/2022   Lab Results  Component Value Date   CHOL 151 04/07/2022   HDL 54 04/07/2022   LDLCALC 67 04/07/2022   TRIG 183 (H) 04/07/2022   CHOLHDL 2.8 04/07/2022    Lab Results  Component Value Date   HGBA1C 5.8 (H) 04/07/2022    Assessment & Plan    1.  ***      [name], NP 06/07/2022, 10:42 AM

## 2022-06-07 NOTE — Progress Notes (Signed)
Subjective:  Brandi Stephens is a 77 y.o. female who presents for Chief Complaint  Patient presents with   med check    Med check,       Here for med check  Patient Care Team: Brandi Stephens, Brandi Eng, PA-C as PCP - General (Family Medicine) Brandi Pickett Nadean Corwin, Stephens as PCP - Cardiology (Cardiology) Magrinat, Virgie Dad, Stephens as Consulting Physician (Oncology) Brandi Klein, Stephens as Consulting Physician (General Surgery) Brandi Lung, Stephens as Consulting Physician (Family Medicine) Brandi Lose, Stephens as Consulting Physician (Neurosurgery) Brandi Miner, Stephens as Consulting Physician (Dermatology) Brandi Stephens, Brandi Massed, NP as Nurse Practitioner (Hematology and Oncology) Brandi Stephens as Consulting Physician (Orthopedic Surgery) Brandi Hakim, Stephens (Inactive) as Consulting Physician (Anesthesiology) Armbruster, Carlota Raspberry, Stephens as Consulting Physician (Gastroenterology) Brandi Lose, Stephens as Consulting Physician (Neurosurgery) Brandi Pacas, Stephens as Consulting Physician (Neurology) Brandi Stephens, Brandi Stephens as Pharmacist (Pharmacist)   Concerns: She is still currently living down in [place] in [facility] and will likely be there for several months.  She is back in town today for a visit here and she just came from cardiology this morning  Had been having dizzy spelles, legs trembling, ear fullness, and has had some falls.  Went to emergency dept in [place] for ED visit.  Had scans of head and heart, EKG, labs, chest xray.  They felt this was cardiac related  Just came from cardiology office this morning, right before this visit.   She had 30 day event monitor ordered, and will be starting this soon, delivered to her house.  Cardiology is concerned about her heart being cause of these symptoms.  Back in 04/2022 when patient called here about falls and low BP I advised she stop Toprol at that time which she did.   She saw cardiology today and they added  metoprolol back but at a lower dose.  Today was advised  Metoprolol from 74m BID to 12.566mBID and conitnued Bumex prn.   Has hx/o tremor, uses primidone but doesn't feel like it helps  Hasn't seen pain management in a while, as their office canceled the most recent appt. Compliant with medicaiton for pain including Cymbalta, amitriptyline, Valium for spasm, Topamax for migraines and chronic pain  She continues on Requip for restless legs  [place].  She is fasting today for labs.  Diabetes-she continues on the continuous glucose monitor freestyle.  She has had occasional low readings but for the most part she is staying in the target range.  Her liElenor Legatoiew report shows 3% in the low range, 97% in target range.  This was printed today.  She wants to continue on Ozempic injection because she has been very successful with weight loss on this medication and wants to stay where she is at the current dose  She continues on oxygen, [name], albuterol  No other aggravating or relieving factors.    No other c/o.  The following portions of the patient's history were reviewed and updated as appropriate: allergies, current medications, past family history, past medical history, past social history, past surgical history and problem list.  ROS Otherwise as in subjective above    Objective: Pulse 94   Wt 184 lb (83.5 kg)   BMI 27.98 kg/m   Wt Readings from Last 3 Encounters:  06/07/22 184 lb (83.5 kg)  06/07/22 184 lb 6.4 oz (83.6 kg)  05/17/22 188 lb (85.3  kg)   BP Readings from Last 3 Encounters:  06/07/22 (!) 98/58  05/17/22 94/68  04/07/22 100/60   General appearance: alert, no distress, well developed, well nourished Neck: supple, no lymphadenopathy, no thyromegaly, no masses Heart: RRR, normal S1, S2, no murmurs Lungs: CTA bilaterally, no wheezes, rhonchi, or rales Pulses: 2+ radial pulses, 2+ pedal pulses, normal cap  refill Ext: no edema   Assessment: Encounter Diagnoses  Name Primary?   Lightheaded Yes   Essential hypertension    Fall, subsequent encounter    Chronic diastolic CHF (congestive heart failure) ([place])    Other specified hypothyroidism    Chronic hypoxemic respiratory failure (HCC)    Panlobular emphysema (HCC)    Stage 3a chronic kidney disease ([place])    Type II diabetes mellitus with complication (HCC)    High risk medication use      Plan: Lightheaded-saw cardiology today, will be doing event monitor.  Labs as below today.  She had a workup at the emergency department at the [facility] but does not have those records available today  Hypertension, heart failure-continue current medicine per cardiology, metoprolol was changed to 12.5 mg twice daily whereas she had stopped it temporarily since January due to low blood pressure readings, per cardiology continued on Bumex as needed  Fall-possibly due to arrhythmia.  Also consider physical therapy for balance concerns  Hypothyroidism-continue current medication, labs today  COPD, emphysema-continue oxygen therapy, inhalers include maintenance and rescue inhalers  CKD 3 A-updated labs today  Diabetes and obesity-she has had significant weight loss with Ozempic and wants to continue the current dose.  I reviewed her freestyle libre report and she is in range for target 97% time and has 3% low readings.  We discussed importance of avoiding hypoglycemia    [name] was seen today for med check.  Diagnoses and all orders for this visit:  Lightheaded  Essential hypertension -     TSH -     CBC -     Basic metabolic panel  Fall, subsequent encounter  Chronic diastolic CHF (congestive heart failure) ([place])  Other specified hypothyroidism -     TSH -     Basic metabolic panel  Chronic hypoxemic respiratory failure (HCC)  Panlobular emphysema (HCC)  Stage 3a chronic kidney disease (HCC)  Type II diabetes mellitus with  complication ([place]) -     Basic metabolic panel  High risk medication use    Follow up: pending labs, cardiology f/u

## 2022-06-07 NOTE — Telephone Encounter (Signed)
Patient seen today in office by Diona Browner, NP.

## 2022-06-07 NOTE — Telephone Encounter (Signed)
Pt would like a order for an Oxygofit oxygen tank as it is light weight. Pt ius on 2 L and the tank she is using now is 8-10 pounds and too heavy for her

## 2022-06-07 NOTE — Patient Instructions (Addendum)
Medication Instructions:  Metoprolol 12.5 mg twice daily Bumex as needed   *If you need a refill on your cardiac medications before your next appointment, please call your pharmacy*   Lab Work: NONE ordered at this time of appointment   If you have labs (blood work) drawn today and your tests are completely normal, you will receive your results only by: [place] (if you have MyChart) OR A paper copy in the mail If you have any lab test that is abnormal or we need to change your treatment, we will call you to review the results.   Testing/Procedures: Your physician has requested that you have an echocardiogram. Echocardiography is a painless test that uses sound waves to create images of your heart. It provides your doctor with information about the size and shape of your heart and how well your heart's chambers and valves are working. This procedure takes approximately one hour. There are no restrictions for this procedure. Please do NOT wear cologne, perfume, aftershave, or lotions (deodorant is allowed). Please arrive 15 minutes prior to your appointment time.   Your physician has recommended that you wear an event monitor for 30 days. Event monitors are medical devices that record the heart's electrical activity. Doctors most often [place] these monitors to diagnose arrhythmias. Arrhythmias are problems with the speed or rhythm of the heartbeat. The monitor is a small, portable device. You can wear one while you do your normal daily activities. This is usually used to diagnose what is causing palpitations/syncope (passing out).    Follow-Up: At [facility], you and your health needs are our priority.  As part of our continuing mission to provide you with exceptional heart care, we have created designated Provider Care Teams.  These Care Teams include your primary Cardiologist (physician) and Advanced Practice Providers (APPs -  Physician Assistants and Nurse Practitioners)  who all work together to provide you with the care you need, when you need it.  We recommend signing up for the patient portal called "MyChart".  Sign up information is provided on this After Visit Summary.  MyChart is used to connect with patients for Virtual Visits (Telemedicine).  Patients are able to view lab/test results, encounter notes, upcoming appointments, etc.  Non-urgent messages can be sent to your provider as well.   To learn more about what you can do with MyChart, go to [email or web address].    Your next appointment:   8 week(s)  Provider:   Diona Browner, NP        Other Instructions

## 2022-06-07 NOTE — Telephone Encounter (Signed)
Faxed over [phone]

## 2022-06-07 NOTE — Telephone Encounter (Signed)
Faxed to [facility].

## 2022-06-08 ENCOUNTER — Other Ambulatory Visit: Payer: Self-pay

## 2022-06-08 ENCOUNTER — Other Ambulatory Visit: Payer: Medicare HMO

## 2022-06-08 ENCOUNTER — Encounter: Payer: Self-pay | Admitting: Nurse Practitioner

## 2022-06-08 DIAGNOSIS — M4626 Osteomyelitis of vertebra, lumbar region: Secondary | ICD-10-CM

## 2022-06-08 LAB — CBC
Hematocrit: 42.4 % (ref 34.0–46.6)
Hemoglobin: 14.2 g/dL (ref 11.1–15.9)
MCH: 31.2 pg (ref 26.6–33.0)
MCHC: 33.5 g/dL (ref 31.5–35.7)
MCV: 93 fL (ref 79–97)
Platelets: 257 10*3/uL (ref 150–450)
RBC: 4.55 x10E6/uL (ref 3.77–5.28)
RDW: 13.9 % (ref 11.7–15.4)
WBC: 10.2 10*3/uL (ref 3.4–10.8)

## 2022-06-08 LAB — BASIC METABOLIC PANEL
BUN/Creatinine Ratio: 15 (ref 12–28)
BUN: 24 mg/dL (ref 8–27)
CO2: 25 mmol/L (ref 20–29)
Calcium: 11 mg/dL — ABNORMAL HIGH (ref 8.7–10.3)
Chloride: 97 mmol/L (ref 96–106)
Creatinine, Ser: 1.63 mg/dL — ABNORMAL HIGH (ref 0.57–1.00)
Glucose: 90 mg/dL (ref 70–99)
Potassium: 3.7 mmol/L (ref 3.5–5.2)
Sodium: 143 mmol/L (ref 134–144)
eGFR: 33 mL/min/{1.73_m2} — ABNORMAL LOW (ref >59)

## 2022-06-08 LAB — TSH: TSH: 0.271 u[IU]/mL — ABNORMAL LOW (ref 0.450–4.500)

## 2022-06-09 ENCOUNTER — Other Ambulatory Visit: Payer: Medicare HMO

## 2022-06-09 ENCOUNTER — Other Ambulatory Visit: Payer: Self-pay

## 2022-06-09 DIAGNOSIS — M4626 Osteomyelitis of vertebra, lumbar region: Secondary | ICD-10-CM

## 2022-06-10 ENCOUNTER — Other Ambulatory Visit: Payer: Self-pay | Admitting: Medical

## 2022-06-10 LAB — SEDIMENTATION RATE: Sed Rate: 25 mm/h (ref 0–30)

## 2022-06-10 LAB — C-REACTIVE PROTEIN: CRP: 19.4 mg/L — ABNORMAL HIGH (ref <8.0)

## 2022-06-10 MED ORDER — LEVOTHYROXINE SODIUM 75 MCG PO TABS: 75.0000 ug | ORAL_TABLET | Freq: Every day | ORAL | 1 refills | Status: DC

## 2022-06-10 MED ORDER — SEMAGLUTIDE (1 MG/DOSE) 4 MG/3ML ~~LOC~~ SOPN: 1.0000 mg | PEN_INJECTOR | SUBCUTANEOUS | 2 refills | Status: DC

## 2022-06-10 MED ORDER — ATORVASTATIN CALCIUM 80 MG PO TABS: 80.0000 mg | ORAL_TABLET | Freq: Every day | ORAL | 0 refills | Status: DC

## 2022-06-10 MED ORDER — DULOXETINE HCL 60 MG PO CPEP: ORAL_CAPSULE | ORAL | 0 refills | Status: DC

## 2022-06-10 NOTE — Progress Notes (Signed)
Labs show thyroid overcorrected.  I will lower the dose of the thyroid to 75 mcg daily.  She is little dehydrated, kidney marker worse than prior but this may be transient due to dehydration, calcium elevated, blood counts okay.  I would hesitate on using fluid pill unless she is having significant edema unless cardiologist told her something different  Try to get at least 60 to 70 ounces of water daily  Continue testing from cardiology  Where does she want her medications sent to?  We will need to recheck the calcium on next visit

## 2022-06-13 ENCOUNTER — Other Ambulatory Visit: Payer: Self-pay | Admitting: Medical

## 2022-06-13 MED ORDER — FREESTYLE LIBRE 3 SENSOR MISC: 6 refills | Status: DC

## 2022-06-13 MED ORDER — ROPINIROLE HCL 1 MG PO TABS: ORAL_TABLET | ORAL | 0 refills | Status: DC

## 2022-06-13 NOTE — Addendum Note (Signed)
Addended by: Minette Headland A on: 06/13/2022 12:12 PM   Modules accepted: Orders

## 2022-06-16 ENCOUNTER — Ambulatory Visit: Payer: Medicare HMO | Attending: Nurse Practitioner

## 2022-06-16 DIAGNOSIS — I10 Essential (primary) hypertension: Secondary | ICD-10-CM | POA: Diagnosis not present

## 2022-06-16 DIAGNOSIS — R002 Palpitations: Secondary | ICD-10-CM | POA: Diagnosis not present

## 2022-06-16 DIAGNOSIS — J431 Panlobular emphysema: Secondary | ICD-10-CM

## 2022-06-16 DIAGNOSIS — R000 Tachycardia, unspecified: Secondary | ICD-10-CM

## 2022-06-16 DIAGNOSIS — I5032 Chronic diastolic (congestive) heart failure: Secondary | ICD-10-CM | POA: Diagnosis not present

## 2022-06-16 DIAGNOSIS — E782 Mixed hyperlipidemia: Secondary | ICD-10-CM | POA: Diagnosis not present

## 2022-06-16 DIAGNOSIS — R55 Syncope and collapse: Secondary | ICD-10-CM

## 2022-06-17 ENCOUNTER — Encounter: Payer: Medicare HMO | Admitting: Physical Medicine and Rehabilitation

## 2022-06-17 DIAGNOSIS — R000 Tachycardia, unspecified: Secondary | ICD-10-CM | POA: Diagnosis not present

## 2022-06-17 DIAGNOSIS — R002 Palpitations: Secondary | ICD-10-CM | POA: Diagnosis not present

## 2022-06-17 DIAGNOSIS — R55 Syncope and collapse: Secondary | ICD-10-CM | POA: Diagnosis not present

## 2022-06-20 ENCOUNTER — Emergency Department (HOSPITAL_BASED_OUTPATIENT_CLINIC_OR_DEPARTMENT_OTHER): Payer: Medicare HMO

## 2022-06-20 ENCOUNTER — Encounter (HOSPITAL_BASED_OUTPATIENT_CLINIC_OR_DEPARTMENT_OTHER): Payer: Self-pay | Admitting: Emergency Medicine

## 2022-06-20 ENCOUNTER — Inpatient Hospital Stay (HOSPITAL_BASED_OUTPATIENT_CLINIC_OR_DEPARTMENT_OTHER)
Admission: EM | Admit: 2022-06-20 | Discharge: 2022-06-29 | DRG: 872 | Disposition: A | Discharge status: Home Health | Payer: Medicare HMO | Attending: Internal Medicine | Admitting: Internal Medicine

## 2022-06-20 ENCOUNTER — Other Ambulatory Visit: Payer: Self-pay

## 2022-06-20 DIAGNOSIS — Z79890 Hormone replacement therapy (postmenopausal): Secondary | ICD-10-CM

## 2022-06-20 DIAGNOSIS — Z981 Arthrodesis status: Secondary | ICD-10-CM

## 2022-06-20 DIAGNOSIS — I951 Orthostatic hypotension: Secondary | ICD-10-CM | POA: Diagnosis present

## 2022-06-20 DIAGNOSIS — M47816 Spondylosis without myelopathy or radiculopathy, lumbar region: Secondary | ICD-10-CM | POA: Diagnosis present

## 2022-06-20 DIAGNOSIS — E876 Hypokalemia: Secondary | ICD-10-CM | POA: Diagnosis not present

## 2022-06-20 DIAGNOSIS — I5032 Chronic diastolic (congestive) heart failure: Secondary | ICD-10-CM | POA: Diagnosis present

## 2022-06-20 DIAGNOSIS — G2581 Restless legs syndrome: Secondary | ICD-10-CM | POA: Diagnosis present

## 2022-06-20 DIAGNOSIS — E785 Hyperlipidemia, unspecified: Secondary | ICD-10-CM | POA: Diagnosis present

## 2022-06-20 DIAGNOSIS — M4854XA Collapsed vertebra, not elsewhere classified, thoracic region, initial encounter for fracture: Secondary | ICD-10-CM | POA: Diagnosis not present

## 2022-06-20 DIAGNOSIS — N179 Acute kidney failure, unspecified: Secondary | ICD-10-CM | POA: Diagnosis not present

## 2022-06-20 DIAGNOSIS — E1122 Type 2 diabetes mellitus with diabetic chronic kidney disease: Secondary | ICD-10-CM | POA: Diagnosis not present

## 2022-06-20 DIAGNOSIS — E118 Type 2 diabetes mellitus with unspecified complications: Secondary | ICD-10-CM

## 2022-06-20 DIAGNOSIS — J449 Chronic obstructive pulmonary disease, unspecified: Secondary | ICD-10-CM | POA: Diagnosis present

## 2022-06-20 DIAGNOSIS — Z90710 Acquired absence of both cervix and uterus: Secondary | ICD-10-CM

## 2022-06-20 DIAGNOSIS — A419 Sepsis, unspecified organism: Principal | ICD-10-CM

## 2022-06-20 DIAGNOSIS — E861 Hypovolemia: Secondary | ICD-10-CM | POA: Diagnosis present

## 2022-06-20 DIAGNOSIS — Z87891 Personal history of nicotine dependence: Secondary | ICD-10-CM

## 2022-06-20 DIAGNOSIS — Z9981 Dependence on supplemental oxygen: Secondary | ICD-10-CM

## 2022-06-20 DIAGNOSIS — Z79899 Other long term (current) drug therapy: Secondary | ICD-10-CM | POA: Diagnosis not present

## 2022-06-20 DIAGNOSIS — Z7951 Long term (current) use of inhaled steroids: Secondary | ICD-10-CM

## 2022-06-20 DIAGNOSIS — Z961 Presence of intraocular lens: Secondary | ICD-10-CM | POA: Diagnosis present

## 2022-06-20 DIAGNOSIS — R55 Syncope and collapse: Secondary | ICD-10-CM | POA: Diagnosis not present

## 2022-06-20 DIAGNOSIS — E039 Hypothyroidism, unspecified: Secondary | ICD-10-CM | POA: Diagnosis present

## 2022-06-20 DIAGNOSIS — Z886 Allergy status to analgesic agent status: Secondary | ICD-10-CM

## 2022-06-20 DIAGNOSIS — R6520 Severe sepsis without septic shock: Secondary | ICD-10-CM

## 2022-06-20 DIAGNOSIS — Z9181 History of falling: Secondary | ICD-10-CM

## 2022-06-20 DIAGNOSIS — Z9851 Tubal ligation status: Secondary | ICD-10-CM

## 2022-06-20 DIAGNOSIS — E1169 Type 2 diabetes mellitus with other specified complication: Secondary | ICD-10-CM | POA: Diagnosis present

## 2022-06-20 DIAGNOSIS — Z8701 Personal history of pneumonia (recurrent): Secondary | ICD-10-CM

## 2022-06-20 DIAGNOSIS — R000 Tachycardia, unspecified: Secondary | ICD-10-CM | POA: Diagnosis not present

## 2022-06-20 DIAGNOSIS — K2100 Gastro-esophageal reflux disease with esophagitis, without bleeding: Secondary | ICD-10-CM | POA: Diagnosis present

## 2022-06-20 DIAGNOSIS — A0811 Acute gastroenteropathy due to Norwalk agent: Secondary | ICD-10-CM | POA: Diagnosis present

## 2022-06-20 DIAGNOSIS — F32A Depression, unspecified: Secondary | ICD-10-CM | POA: Diagnosis present

## 2022-06-20 DIAGNOSIS — Z792 Long term (current) use of antibiotics: Secondary | ICD-10-CM

## 2022-06-20 DIAGNOSIS — I130 Hypertensive heart and chronic kidney disease with heart failure and stage 1 through stage 4 chronic kidney disease, or unspecified chronic kidney disease: Secondary | ICD-10-CM | POA: Diagnosis present

## 2022-06-20 DIAGNOSIS — Z1152 Encounter for screening for COVID-19: Secondary | ICD-10-CM | POA: Diagnosis not present

## 2022-06-20 DIAGNOSIS — M170 Bilateral primary osteoarthritis of knee: Secondary | ICD-10-CM | POA: Diagnosis present

## 2022-06-20 DIAGNOSIS — I10 Essential (primary) hypertension: Secondary | ICD-10-CM | POA: Diagnosis not present

## 2022-06-20 DIAGNOSIS — M4626 Osteomyelitis of vertebra, lumbar region: Secondary | ICD-10-CM | POA: Diagnosis present

## 2022-06-20 DIAGNOSIS — Z7985 Long-term (current) use of injectable non-insulin antidiabetic drugs: Secondary | ICD-10-CM

## 2022-06-20 DIAGNOSIS — E872 Acidosis, unspecified: Secondary | ICD-10-CM

## 2022-06-20 DIAGNOSIS — E1142 Type 2 diabetes mellitus with diabetic polyneuropathy: Secondary | ICD-10-CM | POA: Diagnosis present

## 2022-06-20 DIAGNOSIS — M545 Low back pain, unspecified: Secondary | ICD-10-CM | POA: Diagnosis not present

## 2022-06-20 DIAGNOSIS — Z85828 Personal history of other malignant neoplasm of skin: Secondary | ICD-10-CM

## 2022-06-20 DIAGNOSIS — N281 Cyst of kidney, acquired: Secondary | ICD-10-CM | POA: Diagnosis not present

## 2022-06-20 DIAGNOSIS — Z923 Personal history of irradiation: Secondary | ICD-10-CM

## 2022-06-20 DIAGNOSIS — K5730 Diverticulosis of large intestine without perforation or abscess without bleeding: Secondary | ICD-10-CM | POA: Diagnosis not present

## 2022-06-20 DIAGNOSIS — Z853 Personal history of malignant neoplasm of breast: Secondary | ICD-10-CM

## 2022-06-20 DIAGNOSIS — Z86010 Personal history of colonic polyps: Secondary | ICD-10-CM

## 2022-06-20 DIAGNOSIS — E860 Dehydration: Secondary | ICD-10-CM | POA: Diagnosis not present

## 2022-06-20 DIAGNOSIS — Z880 Allergy status to penicillin: Secondary | ICD-10-CM

## 2022-06-20 DIAGNOSIS — R0989 Other specified symptoms and signs involving the circulatory and respiratory systems: Secondary | ICD-10-CM | POA: Diagnosis not present

## 2022-06-20 DIAGNOSIS — G8929 Other chronic pain: Secondary | ICD-10-CM | POA: Diagnosis present

## 2022-06-20 DIAGNOSIS — Z9842 Cataract extraction status, left eye: Secondary | ICD-10-CM

## 2022-06-20 DIAGNOSIS — N189 Chronic kidney disease, unspecified: Secondary | ICD-10-CM | POA: Diagnosis not present

## 2022-06-20 DIAGNOSIS — K5909 Other constipation: Secondary | ICD-10-CM | POA: Diagnosis present

## 2022-06-20 DIAGNOSIS — M549 Dorsalgia, unspecified: Secondary | ICD-10-CM | POA: Diagnosis present

## 2022-06-20 DIAGNOSIS — R197 Diarrhea, unspecified: Secondary | ICD-10-CM

## 2022-06-20 DIAGNOSIS — R112 Nausea with vomiting, unspecified: Secondary | ICD-10-CM | POA: Diagnosis not present

## 2022-06-20 DIAGNOSIS — R579 Shock, unspecified: Secondary | ICD-10-CM

## 2022-06-20 DIAGNOSIS — J439 Emphysema, unspecified: Secondary | ICD-10-CM | POA: Diagnosis not present

## 2022-06-20 DIAGNOSIS — Z803 Family history of malignant neoplasm of breast: Secondary | ICD-10-CM

## 2022-06-20 DIAGNOSIS — Z8619 Personal history of other infectious and parasitic diseases: Secondary | ICD-10-CM

## 2022-06-20 DIAGNOSIS — Z888 Allergy status to other drugs, medicaments and biological substances status: Secondary | ICD-10-CM

## 2022-06-20 DIAGNOSIS — K5900 Constipation, unspecified: Secondary | ICD-10-CM | POA: Diagnosis not present

## 2022-06-20 DIAGNOSIS — F419 Anxiety disorder, unspecified: Secondary | ICD-10-CM | POA: Diagnosis present

## 2022-06-20 DIAGNOSIS — R1110 Vomiting, unspecified: Secondary | ICD-10-CM | POA: Diagnosis not present

## 2022-06-20 LAB — TROPONIN I (HIGH SENSITIVITY)
Troponin I (High Sensitivity): 16 ng/L (ref <18)
Troponin I (High Sensitivity): 13 ng/L (ref <18)

## 2022-06-20 LAB — URINALYSIS, W/ REFLEX TO CULTURE (INFECTION SUSPECTED)
Bacteria, UA: NONE SEEN
Bilirubin Urine: NEGATIVE
Glucose, UA: NEGATIVE mg/dL
Hgb urine dipstick: NEGATIVE
Ketones, ur: NEGATIVE mg/dL
Nitrite: NEGATIVE
Protein, ur: 100 mg/dL — AB
Specific Gravity, Urine: >1.046 — ABNORMAL HIGH (ref 1.005–1.030)
Squamous Epithelial / HPF: >50 /HPF (ref 0–5)
pH: 8.5 — ABNORMAL HIGH (ref 5.0–8.0)

## 2022-06-20 LAB — COMPREHENSIVE METABOLIC PANEL
ALT: 13 U/L (ref 0–44)
AST: 20 U/L (ref 15–41)
Albumin: 4.2 g/dL (ref 3.5–5.0)
Alkaline Phosphatase: 84 U/L (ref 38–126)
Anion gap: 15 (ref 5–15)
BUN: 23 mg/dL (ref 8–23)
CO2: 31 mmol/L (ref 22–32)
Calcium: 11.6 mg/dL — ABNORMAL HIGH (ref 8.9–10.3)
Chloride: 97 mmol/L — ABNORMAL LOW (ref 98–111)
Creatinine, Ser: 1.3 mg/dL — ABNORMAL HIGH (ref 0.44–1.00)
GFR, Estimated: 43 mL/min — ABNORMAL LOW (ref >60)
Glucose, Bld: 132 mg/dL — ABNORMAL HIGH (ref 70–99)
Potassium: 3.2 mmol/L — ABNORMAL LOW (ref 3.5–5.1)
Sodium: 143 mmol/L (ref 135–145)
Total Bilirubin: 0.8 mg/dL (ref 0.3–1.2)
Total Protein: 7.3 g/dL (ref 6.5–8.1)

## 2022-06-20 LAB — CBC WITH DIFFERENTIAL/PLATELET
Abs Immature Granulocytes: 0.05 10*3/uL (ref 0.00–0.07)
Basophils Absolute: 0.1 10*3/uL (ref 0.0–0.1)
Basophils Relative: 1 %
Eosinophils Absolute: 0.3 10*3/uL (ref 0.0–0.5)
Eosinophils Relative: 3 %
HCT: 48.5 % — ABNORMAL HIGH (ref 36.0–46.0)
Hemoglobin: 16.2 g/dL — ABNORMAL HIGH (ref 12.0–15.0)
Immature Granulocytes: 1 %
Lymphocytes Relative: 14 %
Lymphs Abs: 1.5 10*3/uL (ref 0.7–4.0)
MCH: 31.3 pg (ref 26.0–34.0)
MCHC: 33.4 g/dL (ref 30.0–36.0)
MCV: 93.8 fL (ref 80.0–100.0)
Monocytes Absolute: 0.8 10*3/uL (ref 0.1–1.0)
Monocytes Relative: 7 %
Neutro Abs: 8.3 10*3/uL — ABNORMAL HIGH (ref 1.7–7.7)
Neutrophils Relative %: 74 %
Platelets: 272 10*3/uL (ref 150–400)
RBC: 5.17 MIL/uL — ABNORMAL HIGH (ref 3.87–5.11)
RDW: 14.8 % (ref 11.5–15.5)
WBC: 11 10*3/uL — ABNORMAL HIGH (ref 4.0–10.5)
nRBC: 0 % (ref 0.0–0.2)

## 2022-06-20 LAB — LACTIC ACID, PLASMA
Lactic Acid, Venous: 2.9 mmol/L (ref 0.5–1.9)
Lactic Acid, Venous: 2.8 mmol/L (ref 0.5–1.9)
Lactic Acid, Venous: 2.6 mmol/L (ref 0.5–1.9)
Lactic Acid, Venous: 1.9 mmol/L (ref 0.5–1.9)

## 2022-06-20 LAB — RESP PANEL BY RT-PCR (RSV, FLU A&B, COVID)  RVPGX2
Influenza A by PCR: NEGATIVE
Influenza B by PCR: NEGATIVE
Resp Syncytial Virus by PCR: NEGATIVE
SARS Coronavirus 2 by RT PCR: NEGATIVE

## 2022-06-20 LAB — APTT: aPTT: 25 seconds (ref 24–36)

## 2022-06-20 LAB — MAGNESIUM: Magnesium: 1.4 mg/dL — ABNORMAL LOW (ref 1.7–2.4)

## 2022-06-20 LAB — PROTIME-INR
INR: 0.9 (ref 0.8–1.2)
Prothrombin Time: 12.5 seconds (ref 11.4–15.2)

## 2022-06-20 MED ORDER — INSULIN ASPART 100 UNIT/ML IJ SOLN: 0.0000 [IU] | INTRAMUSCULAR | Status: DC

## 2022-06-20 MED ORDER — METRONIDAZOLE 500 MG/100ML IV SOLN
500.0000 mg | Freq: Once | INTRAVENOUS | Status: AC
  Administered 2022-06-20: 500 mg via INTRAVENOUS
  Filled 2022-06-20: qty 100

## 2022-06-20 MED ORDER — POLYETHYLENE GLYCOL 3350 17 G PO PACK: 17.0000 g | PACK | Freq: Every day | ORAL | Status: DC | PRN

## 2022-06-20 MED ORDER — LACTATED RINGERS IV BOLUS
1500.0000 mL | Freq: Once | INTRAVENOUS | Status: AC
  Administered 2022-06-20: 1500 mL via INTRAVENOUS

## 2022-06-20 MED ORDER — SODIUM CHLORIDE 0.9 % IV SOLN
2.0000 g | Freq: Once | INTRAVENOUS | Status: AC
  Administered 2022-06-20: 2 g via INTRAVENOUS
  Filled 2022-06-20: qty 12.5

## 2022-06-20 MED ORDER — VANCOMYCIN HCL IN DEXTROSE 1-5 GM/200ML-% IV SOLN
1000.0000 mg | Freq: Once | INTRAVENOUS | Status: AC
  Administered 2022-06-20: 1000 mg via INTRAVENOUS
  Filled 2022-06-20: qty 200

## 2022-06-20 MED ORDER — POTASSIUM CHLORIDE 10 MEQ/100ML IV SOLN
10.0000 meq | INTRAVENOUS | Status: AC
  Administered 2022-06-21 (×3): 10 meq via INTRAVENOUS
  Filled 2022-06-20 (×3): qty 100

## 2022-06-20 MED ORDER — BISACODYL 10 MG RE SUPP: 10.0000 mg | Freq: Every day | RECTAL | Status: DC | PRN

## 2022-06-20 MED ORDER — VANCOMYCIN HCL IN DEXTROSE 1-5 GM/200ML-% IV SOLN: 1000.0000 mg | INTRAVENOUS | Status: DC

## 2022-06-20 MED ORDER — LACTATED RINGERS IV BOLUS
1000.0000 mL | Freq: Once | INTRAVENOUS | Status: AC
  Administered 2022-06-20: 1000 mL via INTRAVENOUS

## 2022-06-20 MED ORDER — MAGNESIUM SULFATE 2 GM/50ML IV SOLN
2.0000 g | Freq: Once | INTRAVENOUS | Status: AC
  Administered 2022-06-20: 2 g via INTRAVENOUS
  Filled 2022-06-20: qty 50

## 2022-06-20 MED ORDER — IOHEXOL 350 MG/ML SOLN
100.0000 mL | Freq: Once | INTRAVENOUS | Status: AC | PRN
  Administered 2022-06-20: 50 mL via INTRAVENOUS

## 2022-06-20 MED ORDER — SODIUM CHLORIDE 0.9 % IV SOLN: INTRAVENOUS | Status: DC | PRN

## 2022-06-20 MED ORDER — LACTATED RINGERS IV BOLUS: 1478.0000 mL | Freq: Once | INTRAVENOUS | Status: DC

## 2022-06-20 MED ORDER — METRONIDAZOLE 500 MG/100ML IV SOLN
500.0000 mg | Freq: Two times a day (BID) | INTRAVENOUS | Status: DC
  Administered 2022-06-20 – 2022-06-21 (×3): 500 mg via INTRAVENOUS
  Filled 2022-06-20 (×4): qty 100

## 2022-06-20 MED ORDER — AMITRIPTYLINE HCL 25 MG PO TABS
100.0000 mg | ORAL_TABLET | Freq: Every day | ORAL | Status: DC
  Administered 2022-06-20 – 2022-06-28 (×9): 100 mg via ORAL
  Filled 2022-06-20 (×9): qty 4

## 2022-06-20 MED ORDER — SODIUM CHLORIDE 0.9 % IV SOLN
2.0000 g | Freq: Two times a day (BID) | INTRAVENOUS | Status: DC
  Administered 2022-06-21 – 2022-06-22 (×3): 2 g via INTRAVENOUS
  Filled 2022-06-20 (×3): qty 12.5

## 2022-06-20 MED ORDER — ENOXAPARIN SODIUM 40 MG/0.4ML IJ SOSY
40.0000 mg | PREFILLED_SYRINGE | INTRAMUSCULAR | Status: DC
  Administered 2022-06-21 – 2022-06-29 (×9): 40 mg via SUBCUTANEOUS
  Filled 2022-06-20 (×9): qty 0.4

## 2022-06-20 MED ORDER — LACTATED RINGERS IV SOLN: INTRAVENOUS | Status: AC

## 2022-06-20 NOTE — ED Provider Notes (Signed)
[facility] Provider Note   CSN: [id number] Arrival date & time: 06/20/22  1100     History  Chief Complaint  Patient presents with   Emesis   Dehydration    Brandi Stephens is a 77 y.o. female.   Emesis Associated symptoms: abdominal pain and diarrhea      77 year old female with medical history significant for phonic diastolic CHF, obesity, sepsis, COPD, CKD, osteomyelitis status post lumbar spinal surgery requiring chronic antibiotic suppressive therapy who follows outpatient with ID who presents to the emergency department with concern for dehydration, lightheadedness, 2 weeks of vomiting and inability to tolerate food with multiple episodes of watery diarrhea.  She states that she has had decreased p.o. intake both solids and liquids during that time.  She endorses generalized abdominal discomfort. She has been on suppressive Cefadroxil per ID notes.  She denies any chest pain or shortness of breath.  She endorses multiple episodes of lightheadedness and near syncope.  No dysuria or increased urinary frequency.  No rashes or skin lesions.  Home Medications Prior to Admission medications   Medication Sig Start Date End Date Taking? Authorizing Provider  amitriptyline (ELAVIL) 100 MG tablet Take 1 tablet (100 mg total) by mouth at bedtime. 08/27/21   Raulkar, Clide Deutscher, MD  atorvastatin (LIPITOR) 80 MG tablet Take 1 tablet (80 mg total) by mouth daily. 06/10/22   Tysinger, Camelia Eng, PA-C  Biotin 3 MG TABS Take 3 mg by mouth daily.    [provider]  blood glucose meter kit and supplies KIT Dispense based on patient and insurance preference. Use up to four times daily as directed. 01/26/21   Hosie Poisson, MD  bumetanide (BUMEX) 1 MG tablet Take 1 mg by mouth daily as needed.    [provider]  cefadroxil (DURICEF) 500 MG capsule Take 500 mg by mouth 2 (two) times daily.    [provider]  Cholecalciferol  (VITAMIN D3) 50 MCG (2000 UT) capsule Take 1 capsule (2,000 Units total) by mouth daily. 08/10/21   Tysinger, Camelia Eng, PA-C  colchicine 0.6 MG tablet Take 2 tablets at once then take 1 tablet an hour later then then 1 tablet twice daily x 3 days. 08/10/21   Tysinger, Camelia Eng, PA-C  Continuous Blood Gluc Sensor (FREESTYLE LIBRE 3 SENSOR) MISC Place 1 sensor on the skin every 14 days. Use to check glucose continuously 06/13/22   Tysinger, Camelia Eng, PA-C  diazepam (VALIUM) 10 MG tablet TAKE 1 TABLET(10 MG) BY MOUTH EVERY 6 HOURS AS NEEDED FOR ANXIETY 04/08/22   Raulkar, Clide Deutscher, MD  DULoxetine (CYMBALTA) 60 MG capsule TAKE 1 CAPSULE(60 MG) BY MOUTH DAILY 06/10/22   Tysinger, Camelia Eng, PA-C  erythromycin ophthalmic ointment Place 1 Application into the left eye 4 (four) times daily. 12/22/21   [provider]  Fluticasone-Umeclidin-Vilant ([name] ELLIPTA) 100-62.5-25 MCG/ACT AEPB Inhale 1 puff into the lungs daily. 04/21/22   Tysinger, Camelia Eng, PA-C  HYDROcodone-acetaminophen (NORCO/VICODIN) 5-325 MG tablet Take 1 tablet by mouth every 4 (four) hours as needed for moderate pain. 08/27/21 08/27/22  Izora Ribas, MD  levothyroxine (SYNTHROID) 75 MCG tablet TAKE 1 TABLET(75 MCG) BY MOUTH DAILY 06/10/22   Tysinger, Camelia Eng, PA-C  Magnesium Gluconate 250 MG TABS Take 1 tablet by mouth daily.    [provider]  metoprolol tartrate (LOPRESSOR) 25 MG tablet Take 0.5 tablets (12.5 mg total) by mouth 2 (two) times daily. 06/07/22   Monge,  Helane Gunther, NP  OXYGEN Inhale 2 L into the lungs.    [provider]  polyethylene glycol powder (GLYCOLAX/MIRALAX) 17 GM/SCOOP powder Take 17 g by mouth daily as needed for moderate constipation.    [provider]  primidone (MYSOLINE) 50 MG tablet TAKE 1 TABLET BY MOUTH TWICE DAILY 11/01/21   Raulkar, Clide Deutscher, MD  Probiotic Product (PROBIOTIC DAILY PO) Take 1 tablet by mouth daily.    [provider]  Propylene Glycol (SYSTANE BALANCE OP)  Place 1 drop into both eyes 4 (four) times daily as needed (dry eyes).    [provider]  rOPINIRole (REQUIP) 1 MG tablet TAKE 1 TABLET(1 MG) BY MOUTH AT BEDTIME 06/13/22   Tysinger, Camelia Eng, PA-C  Semaglutide, 1 MG/DOSE, 4 MG/3ML SOPN Inject 1 mg as directed once a week. 06/10/22   Tysinger, Camelia Eng, PA-C  topiramate (TOPAMAX) 50 MG tablet Take 1 tablet (50 mg total) by mouth at bedtime. 08/10/21   Raulkar, Clide Deutscher, MD  triamcinolone ointment (KENALOG) 0.1 % Apply 1 Application topically 2 (two) times daily.    [provider]      Allergies    Ampicillin, Gabapentin, Penicillins, and Other    Review of Systems   Review of Systems  Gastrointestinal:  Positive for abdominal pain, diarrhea, nausea and vomiting.  Neurological:  Positive for light-headedness.    Physical Exam Updated Vital Signs BP 133/85   Pulse 96   Temp 98.4 F (36.9 C) (Rectal)   Resp 17   Ht 5' 7.5" (1.715 m)   Wt 82.6 kg   SpO2 98%   BMI 28.08 kg/m  Physical Exam Vitals and nursing note reviewed.  Constitutional:      General: She is not in acute distress.    Appearance: She is well-developed.  HENT:     Head: Normocephalic and atraumatic.     Mouth/Throat:     Mouth: Mucous membranes are dry.  Eyes:     Conjunctiva/sclera: Conjunctivae normal.  Neck:     Comments: No meningismus Cardiovascular:     Rate and Rhythm: Regular rhythm. Tachycardia present.     Heart sounds: No murmur heard. Pulmonary:     Effort: Pulmonary effort is normal. No respiratory distress.     Breath sounds: Normal breath sounds.  Abdominal:     Palpations: Abdomen is soft.     Tenderness: There is abdominal tenderness.  Musculoskeletal:        General: No swelling.     Cervical back: Neck supple.  Skin:    General: Skin is warm and dry.     Capillary Refill: Capillary refill takes less than 2 seconds.  Neurological:     General: No focal deficit present.     Mental Status: She is alert and oriented  to person, place, and time. Mental status is at baseline.     Cranial Nerves: No cranial nerve deficit.     Sensory: No sensory deficit.     Motor: No weakness.  Psychiatric:        Mood and Affect: Mood normal.     ED Results / Procedures / Treatments   Labs (all labs ordered are listed, but only abnormal results are displayed) Labs Reviewed  COMPREHENSIVE METABOLIC PANEL - Abnormal; Notable for the following components:      Result Value   Potassium 3.2 (*)    Chloride 97 (*)    Glucose, Bld 132 (*)    Creatinine, Ser 1.30 (*)  Calcium 11.6 (*)    GFR, Estimated 43 (*)    All other components within normal limits  LACTIC ACID, PLASMA - Abnormal; Notable for the following components:   Lactic Acid, Venous 2.9 (*)    All other components within normal limits  LACTIC ACID, PLASMA - Abnormal; Notable for the following components:   Lactic Acid, Venous 2.8 (*)    All other components within normal limits  CBC WITH DIFFERENTIAL/PLATELET - Abnormal; Notable for the following components:   WBC 11.0 (*)    RBC 5.17 (*)    Hemoglobin 16.2 (*)    HCT 48.5 (*)    Neutro Abs 8.3 (*)    All other components within normal limits  URINALYSIS, W/ REFLEX TO CULTURE (INFECTION SUSPECTED) - Abnormal; Notable for the following components:   APPearance HAZY (*)    Specific Gravity, Urine >1.046 (*)    pH 8.5 (*)    Protein, ur 100 (*)    Leukocytes,Ua TRACE (*)    All other components within normal limits  LACTIC ACID, PLASMA - Abnormal; Notable for the following components:   Lactic Acid, Venous 2.6 (*)    All other components within normal limits  MAGNESIUM - Abnormal; Notable for the following components:   Magnesium 1.4 (*)    All other components within normal limits  RESP PANEL BY RT-PCR (RSV, FLU A&B, COVID)  RVPGX2  CULTURE, BLOOD (ROUTINE X 2)  CULTURE, BLOOD (ROUTINE X 2)  C DIFFICILE QUICK SCREEN W PCR REFLEX    GASTROINTESTINAL PANEL BY PCR, STOOL (REPLACES STOOL  CULTURE)  PROTIME-INR  APTT  LACTIC ACID, PLASMA  TROPONIN I (HIGH SENSITIVITY)  TROPONIN I (HIGH SENSITIVITY)    EKG EKG Interpretation  Date/Time:  Monday June 20 2022 11:46:18 EST Ventricular Rate:  125 PR Interval:  165 QRS Duration: 84 QT Interval:  304 QTC Calculation: 439 R Axis:   -73 Text Interpretation: Sinus tachycardia Probable left atrial enlargement Left anterior fascicular block Abnormal R-wave progression, late transition Nonspecific T abnormalities, lateral leads Confirmed by Regan Lemming ([id number]) on 06/20/2022 11:55:31 AM  Radiology CT ABDOMEN PELVIS W CONTRAST  Result Date: 06/20/2022 CLINICAL DATA:  Bowel obstruction suspected N/V for 2 weeks, hx of lumbar osteomyelitis EXAM: CT ABDOMEN AND PELVIS WITH CONTRAST TECHNIQUE: Multidetector CT imaging of the abdomen and pelvis was performed using the standard protocol following bolus administration of intravenous contrast. RADIATION DOSE REDUCTION: This exam was performed according to the departmental dose-optimization program which includes automated exposure control, adjustment of the mA and/or kV according to patient size and/or use of iterative reconstruction technique. CONTRAST:  82m OMNIPAQUE IOHEXOL 350 MG/ML SOLN COMPARISON:  CT 07/09/2019 FINDINGS: Lower chest: Lingular scarring/subsegmental atelectasis. Unchanged subendocardial hypoattenuation/fat density in the left ventricular septum and in the right ventricle. Hepatobiliary: No focal liver abnormality is seen. The gallbladder is unremarkable. Pancreas: Unremarkable. No pancreatic ductal dilatation or surrounding inflammatory changes. Spleen: Normal in size without focal abnormality. Adrenals/Urinary Tract: Adrenal glands are unremarkable. No hydronephrosis or nephroureterolithiasis. Small bilateral renal cysts which appear simple require no follow-up imaging. Bladder is minimally distended. Stomach/Bowel: There is distal esophageal wall thickening. The stomach is  within normal limits. There is no evidence of bowel obstruction.Prior appendectomy. Moderate to large rectal stool burden. Sigmoid diverticulosis. No acute diverticulitis. Vascular/Lymphatic: Aortoiliac atherosclerosis. No AAA. No lymphadenopathy. Reproductive: Prior hysterectomy. Other: No abdominal wall hernia or abnormality. No abdominopelvic ascites. Musculoskeletal: No acute osseous abnormality. No suspicious osseous lesion. There is a chronic compression deformity of  T12 without evidence of progressive height loss. Prior fusion from L1-S1 with unchanged mild residual retrolisthesis at L1-L2. There is mild lucency along the L1 pedicle screws. Hardware is intact. IMPRESSION: Distal esophageal wall thickening, may suggest esophagitis. Moderate to large rectal stool burden. No bowel obstruction or other acute findings in the abdomen or pelvis. Chronic compression deformity of T12 without evidence of progressive height loss. Prior fusion from L1-S1 with unchanged mild residual retrolisthesis at L1-L2. Mild lucency along the L1 pedicle screws. Electronically Signed   By: Maurine Simmering M.D.   On: 06/20/2022 13:52   DG Chest Port 1 View  Result Date: 06/20/2022 CLINICAL DATA:  Vomiting for 2 weeks. EXAM: PORTABLE CHEST 1 VIEW COMPARISON:  CT chest and chest x-ray dated January 18, 2021. FINDINGS: New loop recorder noted. Heart remains at the upper limits of normal in size. Normal pulmonary vascularity. Chronic emphysematous changes and mildly coarsened interstitial markings are similar. No focal consolidation, pleural effusion, or pneumothorax. No acute osseous abnormality. IMPRESSION: 1. No active disease. 2. COPD. Electronically Signed   By: Titus Dubin M.D.   On: 06/20/2022 12:49    Procedures .Critical Care  Performed by: Regan Lemming, MD Authorized by: Regan Lemming, MD   Critical care provider statement:    Critical care time (minutes):  30   Critical care was necessary to treat or prevent  imminent or life-threatening deterioration of the following conditions:  Dehydration   Critical care was time spent personally by me on the following activities:  Development of treatment plan with patient or surrogate, discussions with consultants, evaluation of patient's response to treatment, examination of patient, ordering and review of laboratory studies, ordering and review of radiographic studies, ordering and performing treatments and interventions, pulse oximetry, re-evaluation of patient's condition and review of old charts   Care discussed with: admitting provider       Medications Ordered in ED Medications  lactated ringers infusion ( Intravenous New Bag/Given 06/20/22 1930)  0.9 %  sodium chloride infusion (0 mLs Intravenous Stopped 06/20/22 1800)  ceFEPIme (MAXIPIME) 2 g in sodium chloride 0.9 % 100 mL IVPB (has no administration in time range)  vancomycin (VANCOCIN) IVPB 1000 mg/200 mL premix (has no administration in time range)  lactated ringers bolus 1,000 mL (0 mLs Intravenous Stopped 06/20/22 1330)  ceFEPIme (MAXIPIME) 2 g in sodium chloride 0.9 % 100 mL IVPB (0 g Intravenous Stopped 06/20/22 1304)  metroNIDAZOLE (FLAGYL) IVPB 500 mg (0 mg Intravenous Stopped 06/20/22 1616)  vancomycin (VANCOCIN) IVPB 1000 mg/200 mL premix (0 mg Intravenous Stopped 06/20/22 1513)  vancomycin (VANCOCIN) IVPB 1000 mg/200 mL premix (0 mg Intravenous Stopped 06/20/22 1741)  lactated ringers bolus 1,500 mL (0 mLs Intravenous Stopped 06/20/22 1455)  iohexol (OMNIPAQUE) 350 MG/ML injection 100 mL (50 mLs Intravenous Contrast Given 06/20/22 1315)  magnesium sulfate IVPB 2 g 50 mL (0 g Intravenous Stopped 06/20/22 1907)    ED Course/ Medical Decision Making/ A&P Clinical Course as of 06/20/22 2004  Mon Jun 20, 2022  1155 BP(!): 72/45 [JL]  1155 Pulse Rate(!): 140 [JL]  1159 Lactic Acid, Venous(!!): 2.9 [JL]    Clinical Course User Index [JL] Regan Lemming, MD                             Medical Decision  Making Amount and/or Complexity of Data Reviewed Labs: ordered. Decision-making details documented in ED Course. Radiology: ordered. ECG/medicine tests: ordered.  Risk Prescription drug  management. Decision regarding hospitalization.    77 year old female with medical history significant for phonic diastolic CHF, obesity, sepsis, COPD, CKD, osteomyelitis status post lumbar spinal surgery requiring chronic antibiotic suppressive therapy who follows outpatient with ID who presents to the emergency department with concern for dehydration, lightheadedness, 2 weeks of vomiting and inability to tolerate food with multiple episodes of watery diarrhea.  She states that she has had decreased p.o. intake both solids and liquids during that time.  She endorses generalized abdominal discomfort. She has been on suppressive Cefadroxil per ID notes.  She denies any chest pain or shortness of breath.  She endorses multiple episodes of lightheadedness and near syncope.  No dysuria or increased urinary frequency.  No rashes or skin lesions.  On arrival, the patient was afebrile, temperature 97.9, tachycardic heart rate 140, RR 16, hypertensive BP 72/45, saturating 95% on room air.  Physical exam for significant for dry mucous membranes, generalized abdominal tenderness.  Code sepsis initiated on my evaluation.  Concern for infectious etiology of the patient's presentation versus severe hypovolemia from dehydration and decreased p.o. intake.  Considered bowel obstruction, C. difficile infection, pneumonia, UTI, intra-abdominal infection.  Good range of motion of the head, mentating appropriately, no meningismus.  Low concern for meningitis.  Considered sepsis and bacteremia from osteomyelitis given the patient's chronic osteomyelitis.  IV access was obtained and the patient was administered a 30 cc/kg IV fluid bolus.  She was started on broad-spectrum antibiotics.  Labs: CBC with leukocytosis 11.0, evidence of  hemoconcentration with a hemoglobin of 16.2, CMP with mild hypokalemia to 3.2, serum creatinine at 1.30, not significantly elevated from baseline, blood cultures collected and pending, urine culture collected and pending, GIP and C. difficile screening pending.  Initial lactic acid was elevated at 2.9, repeat downtrending to 2.8.  Magnesium hypomagnesemic at 1.4.  COVID-19, influenza, RSV PCR testing was collected and resulted negative.  Imaging: CXR:  IMPRESSION:  1. No active disease.  2. COPD.   CT Abdomen Pelvis: IMPRESSION:  Distal esophageal wall thickening, may suggest esophagitis.    Moderate to large rectal stool burden. No bowel obstruction or other  acute findings in the abdomen or pelvis.    Chronic compression deformity of T12 without evidence of progressive  height loss. Prior fusion from L1-S1 with unchanged mild residual  retrolisthesis at L1-L2. Mild lucency along the L1 pedicle screws.     For treatment, the patient was given 30 cc/kg of LR for IVF and vancomycin, Flagyl and cefepime for antimicrobials.   Upon reassessment, the patient appears clinically improved. The patient is not currently hypotensive and has a MAP of >65. The patient's volume status appears to be improved after fluid resuscitation.  Based off the presentation and lab findings, the patient meets criteria for severe sepsis.  [SEVERE SEPSIS] HYPOTENSION (Nursing BPA for hypotension = Is this sepsis?) CREATININE > 2.0, or urine output < 0.5 mL/kg/hour for 2 hours  BILIRUBIN > 2 mg/dL (34.2 mmol/L)  PLATELET COUNT < 100,000  INR > 1.5 or aPTT > 60 sec  LACTATE > 2 mmol/L   [SEPTIC SHOCK] HYPOTENSION REFRACTORY TO 30 cc/kg OF FLUIDS LACTATE > 4 mmol/L  While in the ED, the patient was given: Medications  lactated ringers infusion ( Intravenous New Bag/Given 06/20/22 1930)  0.9 %  sodium chloride infusion (0 mLs Intravenous Stopped 06/20/22 1800)  ceFEPIme (MAXIPIME) 2 g in sodium chloride 0.9 %  100 mL IVPB (has no administration in time range)  vancomycin (VANCOCIN) IVPB  1000 mg/200 mL premix (has no administration in time range)  lactated ringers bolus 1,000 mL (0 mLs Intravenous Stopped 06/20/22 1330)  ceFEPIme (MAXIPIME) 2 g in sodium chloride 0.9 % 100 mL IVPB (0 g Intravenous Stopped 06/20/22 1304)  metroNIDAZOLE (FLAGYL) IVPB 500 mg (0 mg Intravenous Stopped 06/20/22 1616)  vancomycin (VANCOCIN) IVPB 1000 mg/200 mL premix (0 mg Intravenous Stopped 06/20/22 1513)  vancomycin (VANCOCIN) IVPB 1000 mg/200 mL premix (0 mg Intravenous Stopped 06/20/22 1741)  lactated ringers bolus 1,500 mL (0 mLs Intravenous Stopped 06/20/22 1455)  iohexol (OMNIPAQUE) 350 MG/ML injection 100 mL (50 mLs Intravenous Contrast Given 06/20/22 1315)  magnesium sulfate IVPB 2 g 50 mL (0 g Intravenous Stopped 06/20/22 1907)   Urinalysis without evidence of UTI, CT and x-ray imaging and laboratory workup does not reveal a clear source of infection.  Considered bacteremia from the patient's chronic osteomyelitis versus C. difficile infection versus dehydration and severe hypovolemia as the etiology of the patient's presentation.   Brandi Stephens was admitted to the hospital for ongoing treatment and monitoring.  Hospitalist medicine consulted for admission.  Final Clinical Impression(s) / ED Diagnoses Final diagnoses:  Lactic acidosis  Dehydration  Shock ([place])  Diarrhea, unspecified type    Rx / DC Orders ED Discharge Orders     None         Regan Lemming, MD 06/20/22 2004

## 2022-06-20 NOTE — ED Notes (Signed)
Kim/Thomas at [facility] will send transport for Bed Ready at [facility] Room# 1427.-ABB(NS)

## 2022-06-20 NOTE — Assessment & Plan Note (Addendum)
Sepsis with lactic acidosis and initial hypotension, resolved following IVF bolus. 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 -Question if today represents MSSA bacteremia / metastasis of MSSA to another site? -No other obvious symptoms to suggest source at this point other than N/V/D -? C diff perhaps? -Or could be that pt simply had hypotension and lactic acidosis, not from sepsis but from dehydration / orthostasis in setting of N/V/D and 100 lbs of wt loss over past year from ozempic. Sepsis pathway Continue empiric cefepime / flagyl / vanc Tele monitor BCx pending Serial lactates IVF C.Diff ordered and pending, if positive will need to switch ABx. GI pathogen panel procalcitonin

## 2022-06-20 NOTE — Assessment & Plan Note (Addendum)
Mild AKI, Creat today of 1.3, looks like she was 0.9 in Dec. IVF Strict intake and output Repeat BMP in AM Replace Mg and K

## 2022-06-20 NOTE — Progress Notes (Signed)
Elink following code sepsis °

## 2022-06-20 NOTE — ED Notes (Signed)
Patient transported to CT 

## 2022-06-20 NOTE — Progress Notes (Addendum)
Pharmacy Antibiotic Note  Brandi Stephens is a 77 y.o. female admitted on 06/20/2022 with sepsis and sx of dehydration, dizziness, emesis, hypotension, and x2 weeks of vomiting.  Pharmacy has been consulted for vanc and cefepime dosing.  Plan: Initiate vancomycin 1000 mg IV q24h and cefepime 2 g q12h. Monitor for changes in renal fxn and clinical sx. De-escalate when possible.   Height: 5' 7.5" (171.5 cm) Weight: 82.6 kg (182 lb) IBW/kg (Calculated) : 62.75  Temp (24hrs), Avg:97.9 F (36.6 C), Min:97.9 F (36.6 C), Max:97.9 F (36.6 C)  Recent Labs  Lab 06/20/22 1113  WBC 11.0*  CREATININE 1.30*  LATICACIDVEN 2.9*    Estimated Creatinine Clearance: 41.7 mL/min (A) (by C-G formula based on SCr of 1.3 mg/dL (H)).    Allergies  Allergen Reactions   Ampicillin Hives and Other (See Comments)    Severe reaction in February 2017 1.5 month to have hives to go away   Gabapentin Swelling    UNSPECIFIED REACTION    Penicillins Hives and Other (See Comments)    Severe reaction in February 2017 1.5 month to have hives to go away Has patient had a PCN reaction causing immediate rash, facial/tongue/throat swelling, SOB or lightheadedness with hypotension: #  #  #  YES  #  #  #  Has patient had a PCN reaction causing severe rash involving mucus membranes or skin necrosis: No Has patient had a PCN reaction that required hospitalization No Has patient had a PCN reaction occurring within the last 10 years: No    Other Itching    UNSPECIFIED Analgesics    Antimicrobials this admission: Flagyl x1 3/4 >>  Vanc + cefepime 3/4 >>   Dose adjustments this admission: N/A  Microbiology results: 3/4 BCx:    Thank you for allowing pharmacy to be a part of this patient's care.  Raynald Blend 06/20/2022 1:31 PM

## 2022-06-20 NOTE — Progress Notes (Signed)
Notified bedside nurse of need to draw repeat lactic acid. 

## 2022-06-20 NOTE — ED Triage Notes (Signed)
Pt arrives to ED with c/o dehydration, dizziness, diarrhea, emesis, and hypotension. She notes x2 weeks of vomiting and not able to tolerate foods or fluid.

## 2022-06-20 NOTE — H&P (Signed)
History and Physical    Patient: Brandi Stephens [id number] DOB: 1945-09-29 DOA: 06/20/2022 DOS: the patient was seen and examined on 06/20/2022 PCP: Carlena Hurl, PA-C  Patient coming from: Home  Chief Complaint:  Chief Complaint  Patient presents with   Emesis   Dehydration   HPI: Brandi Stephens is a 77 y.o. 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  Pt with 100 lbs of wt loss in past year or so due to ozempic.  Pt with N/V/D for past 2 weeks.  Severe.  Pt in to ED today for N/V/D symptoms.  No fevers, chills, headache, rash, URI symptoms, productive cough.  Some abd discomfort.  No SOB, CP.  No dysuria.  Lactate of 2.9 and initial SBP in the 70s in ED today prompting empiric IVF and ABx for sepsis.   Review of Systems: As mentioned in the history of present illness. All other systems reviewed and are negative. Past Medical History:  Diagnosis Date   AIN (anal intraepithelial neoplasia) anal canal    Anemia    with pregnancy   Anxiety    Arthritis    knees, lower back, knees   Chronic constipation    Chronic diastolic CHF (congestive heart failure) ([place]) 07/05/2019   CKD (chronic kidney disease)    Coarse tremors    Essential   COPD (chronic obstructive pulmonary disease) ([place])    2017 chest CT, history of   Degenerative lumbar spinal stenosis    Depression    Depression 07/05/2019   Drug dependency ([place])    Elevated PTHrP level 05/09/2019   EtOH use 07/05/2019   Pt denies heavy drinking however   GERD (gastroesophageal reflux disease)    pt denies   Gout    Grade I diastolic dysfunction 0000000   Noted ECHO   Hemorrhoids    History of adenomatous polyp of colon    History of basal cell carcinoma (BCC) excision 2015   nose   History of sepsis 05/14/2014   post lumbar surgery   History of shingles    x2   Hyperlipidemia    Hypertension    Hypothyroidism     Irregular heart beat    history of while undergoing radiation   Malignant neoplasm of upper-inner quadrant of left breast in female, estrogen receptor positive [facility]) oncologist-  dr Jana Hakim--  per lov in epic no recurrence   dx 07-13-2015  Left breast invasive ductal carcinoma, Stage IA, Grade 1 (TXN0),  09-16-2015  s/p  left breast lumpectomy with sln bx's,  completed radiation 11-18-2015,  started antiestrogen therapy 12-14-2015   Neuropathy    Left foot and back of left leg   Obesity (BMI 30-39.9) 07/05/2019   Personal history of radiation therapy    completed 11-18-2015  left breast   Pneumonia    PONV (postoperative nausea and vomiting)    Prediabetes    diet controlled, no med   Restless leg syndrome    Seasonal allergies    Seizure ([place]) 05/2015   due to sepsis, only one time episode   Tremor    Wears partial dentures    lower    Past Surgical History:  Procedure Laterality Date   ABDOMINAL HYSTERECTOMY  1985   BASAL CELL CARCINOMA EXCISION  10/15   nose   BREAST BIOPSY Right 08/24/2015   BREAST BIOPSY Right 07/13/2015   BREAST BIOPSY Left 07/13/2015   BREAST EXCISIONAL BIOPSY  Left 1972   BREAST LUMPECTOMY Left    BREAST LUMPECTOMY WITH RADIOACTIVE SEED AND SENTINEL LYMPH NODE BIOPSY Left 09/16/2015   Procedure: BREAST LUMPECTOMY WITH RADIOACTIVE SEED AND SENTINEL LYMPH NODE BIOPSY;  Surgeon: Stark Klein, MD;  Location: [place];  Service: General;  Laterality: Left;   BREAST REDUCTION SURGERY Bilateral 1998   CATARACT EXTRACTION W/ INTRAOCULAR LENS IMPLANT Left 2016   CERVICAL SPINE SURGERY  1995   hallo   COLONOSCOPY  01/2013   polyps   EYE SURGERY Left 2016   HAND SURGERY Left 02-19-2002   dr Fredna Dow  '@[facility]'$    repair collateral ligament/ MPJ left thumb   HARDWARE REMOVAL N/A 07/24/2019   Procedure: REVISION OF HARDWARE Lumbar two - Sacral one. Extention of Fusion to Lumbar one;  Surgeon: [name], MD;  Location: [place];  Service:  Neurosurgery;  Laterality: N/A;   HEMORRHOID SURGERY  2008   HIGH RESOLUTION ANOSCOPY N/A 02/28/2018   Procedure: HIGH RESOLUTION ANOSCOPY WITH BIOPSY;  Surgeon: Leighton Ruff, MD;  Location: [place];  Service: General;  Laterality: N/A;   KNEE ARTHROSCOPY Right 2002   LUMBAR SPINE SURGERY  03-20-2015   dr Kathyrn Sheriff   fusion L4-5, L5-S1   MOUTH SURGERY  07/14/2018   2 infected teeth removed   MULTIPLE TOOTH EXTRACTIONS     with bone grafting lower bottom right and left   REDUCTION MAMMAPLASTY Bilateral    RIGHT COLECTOMY  09-10-2003   dr Margot Chimes '@[facility]'$    multiple colon polyps (per path tubular adenoma's, hyperplastic , benign appendix, benign two lymph nodes   ROTATOR CUFF REPAIR Left 04/2016   TONSILLECTOMY  1969   TUBAL LIGATION Bilateral 1982   Social History:  reports that she quit smoking about 4 years ago. Her smoking use included cigarettes. She has a 17.50 pack-year smoking history. She has never used smokeless tobacco. She reports current alcohol use. She reports that she does not currently use drugs.  Allergies  Allergen Reactions   Ampicillin Hives and Other (See Comments)    Severe reaction in February 2017 1.5 month to have hives to go away   Gabapentin Swelling    UNSPECIFIED REACTION    Penicillins Hives and Other (See Comments)    Severe reaction in February 2017 1.5 month to have hives to go away Has patient had a PCN reaction causing immediate rash, facial/tongue/throat swelling, SOB or lightheadedness with hypotension: #  #  #  YES  #  #  #  Has patient had a PCN reaction causing severe rash involving mucus membranes or skin necrosis: No Has patient had a PCN reaction that required hospitalization No Has patient had a PCN reaction occurring within the last 10 years: No    Other Itching    UNSPECIFIED Analgesics    Family History  Problem Relation Age of Onset   Atrial fibrillation Mother    Ulcers Father    Breast cancer Maternal Aunt     Lung cancer Maternal Uncle    Stomach cancer Maternal Grandmother    Lung cancer Maternal Aunt    Brain cancer Maternal Aunt    Prostate cancer Maternal Grandfather    Stroke Paternal Grandmother    Tuberculosis Paternal Grandfather    Colon cancer Neg Hx    Esophageal cancer Neg Hx    Rectal cancer Neg Hx     Prior to Admission medications   Medication Sig Start Date End Date Taking? Authorizing Provider  amitriptyline (ELAVIL)  100 MG tablet Take 1 tablet (100 mg total) by mouth at bedtime. 08/27/21   Raulkar, Clide Deutscher, MD  atorvastatin (LIPITOR) 80 MG tablet Take 1 tablet (80 mg total) by mouth daily. 06/10/22   Tysinger, Camelia Eng, PA-C  Biotin 3 MG TABS Take 3 mg by mouth daily.    [provider]  blood glucose meter kit and supplies KIT Dispense based on patient and insurance preference. Use up to four times daily as directed. 01/26/21   Hosie Poisson, MD  bumetanide (BUMEX) 1 MG tablet Take 1 mg by mouth daily as needed.    [provider]  cefadroxil (DURICEF) 500 MG capsule Take 500 mg by mouth 2 (two) times daily.    [provider]  Cholecalciferol (VITAMIN D3) 50 MCG (2000 UT) capsule Take 1 capsule (2,000 Units total) by mouth daily. 08/10/21   Tysinger, Camelia Eng, PA-C  colchicine 0.6 MG tablet Take 2 tablets at once then take 1 tablet an hour later then then 1 tablet twice daily x 3 days. 08/10/21   Tysinger, Camelia Eng, PA-C  Continuous Blood Gluc Sensor (FREESTYLE LIBRE 3 SENSOR) MISC Place 1 sensor on the skin every 14 days. Use to check glucose continuously 06/13/22   Tysinger, Camelia Eng, PA-C  diazepam (VALIUM) 10 MG tablet TAKE 1 TABLET(10 MG) BY MOUTH EVERY 6 HOURS AS NEEDED FOR ANXIETY 04/08/22   Raulkar, Clide Deutscher, MD  DULoxetine (CYMBALTA) 60 MG capsule TAKE 1 CAPSULE(60 MG) BY MOUTH DAILY 06/10/22   Tysinger, Camelia Eng, PA-C  erythromycin ophthalmic ointment Place 1 Application into the left eye 4 (four) times daily. 12/22/21   [provider]   Fluticasone-Umeclidin-Vilant ([name] ELLIPTA) 100-62.5-25 MCG/ACT AEPB Inhale 1 puff into the lungs daily. 04/21/22   Tysinger, Camelia Eng, PA-C  HYDROcodone-acetaminophen (NORCO/VICODIN) 5-325 MG tablet Take 1 tablet by mouth every 4 (four) hours as needed for moderate pain. 08/27/21 08/27/22  Izora Ribas, MD  levothyroxine (SYNTHROID) 75 MCG tablet TAKE 1 TABLET(75 MCG) BY MOUTH DAILY 06/10/22   Tysinger, Camelia Eng, PA-C  Magnesium Gluconate 250 MG TABS Take 1 tablet by mouth daily.    [provider]  metoprolol tartrate (LOPRESSOR) 25 MG tablet Take 0.5 tablets (12.5 mg total) by mouth 2 (two) times daily. 06/07/22   [name], NP  OXYGEN Inhale 2 L into the lungs.    [provider]  polyethylene glycol powder (GLYCOLAX/MIRALAX) 17 GM/SCOOP powder Take 17 g by mouth daily as needed for moderate constipation.    [provider]  primidone (MYSOLINE) 50 MG tablet TAKE 1 TABLET BY MOUTH TWICE DAILY 11/01/21   Raulkar, Clide Deutscher, MD  Probiotic Product (PROBIOTIC DAILY PO) Take 1 tablet by mouth daily.    [provider]  Propylene Glycol (SYSTANE BALANCE OP) Place 1 drop into both eyes 4 (four) times daily as needed (dry eyes).    [provider]  rOPINIRole (REQUIP) 1 MG tablet TAKE 1 TABLET(1 MG) BY MOUTH AT BEDTIME 06/13/22   Tysinger, Camelia Eng, PA-C  Semaglutide, 1 MG/DOSE, 4 MG/3ML SOPN Inject 1 mg as directed once a week. 06/10/22   Tysinger, Camelia Eng, PA-C  topiramate (TOPAMAX) 50 MG tablet Take 1 tablet (50 mg total) by mouth at bedtime. 08/10/21   Raulkar, Clide Deutscher, MD  triamcinolone ointment (KENALOG) 0.1 % Apply 1 Application topically 2 (two) times daily.    [provider]    Physical Exam: Vitals:   06/20/22 1845 06/20/22 1930 06/20/22  2039 06/20/22 2046  BP: 133/85 113/76  108/68  Pulse: 96 98  99  Resp: '17 16  20  '$ Temp:    98.3 F (36.8 C)  TempSrc:    Oral  SpO2: 98% 99%  93%  Weight:   86.6 kg   Height:   5' 7.5"  (1.715 m)    Constitutional: NAD, calm, comfortable Respiratory: clear to auscultation bilaterally, no wheezing, no crackles. Normal respiratory effort. No accessory muscle use.  Cardiovascular: Regular rate and rhythm, no murmurs / rubs / gallops. No extremity edema. 2+ pedal pulses. No carotid bruits.  Abdomen: no tenderness, no masses palpated. No hepatosplenomegaly. Bowel sounds positive.  Neurologic: CN 2-12 grossly intact. Sensation intact, DTR normal. Strength 5/5 in all 4.  Psychiatric: Normal judgment and insight. Alert and oriented x 3. Normal mood.   Data Reviewed:       Latest Ref Rng & Units 06/20/2022   11:13 AM 06/07/2022   12:18 PM 04/07/2022    9:36 AM  CBC  WBC 4.0 - 10.5 K/uL 11.0  10.2  8.4   Hemoglobin 12.0 - 15.0 g/dL 16.2  14.2  12.4   Hematocrit 36.0 - 46.0 % 48.5  42.4  37.7   Platelets 150 - 400 K/uL 272  257  250       Latest Ref Rng & Units 06/20/2022   11:13 AM 06/07/2022   12:18 PM 04/07/2022    9:36 AM  CMP  Glucose 70 - 99 mg/dL 132  90  94   BUN 8 - 23 mg/dL '23  24  22   '$ Creatinine 0.44 - 1.00 mg/dL 1.30  1.63  0.84   Sodium 135 - 145 mmol/L 143  143  143   Potassium 3.5 - 5.1 mmol/L 3.2  3.7  4.1   Chloride 98 - 111 mmol/L 97  97  104   CO2 22 - 32 mmol/L '31  25  24   '$ Calcium 8.9 - 10.3 mg/dL 11.6  11.0  10.4   Total Protein 6.5 - 8.1 g/dL 7.3     Total Bilirubin 0.3 - 1.2 mg/dL 0.8     Alkaline Phos 38 - 126 U/L 84     AST 15 - 41 U/L 20     ALT 0 - 44 U/L 13      Lactic acid: 2.9 -> 2.8 -> 2.6 -> 1.9  Trop 13  COVID, FLU, RSV = negative  Urinalysis    Component Value Date/Time   COLORURINE YELLOW 06/20/2022 1434   APPEARANCEUR HAZY (A) 06/20/2022 1434   LABSPEC >1.046 (H) 06/20/2022 1434   LABSPEC 1.025 06/24/2019 1426   PHURINE 8.5 (H) 06/20/2022 1434   GLUCOSEU NEGATIVE 06/20/2022 1434   HGBUR NEGATIVE 06/20/2022 1434   BILIRUBINUR NEGATIVE 06/20/2022 1434   BILIRUBINUR moderate (A) 06/24/2019 1426   BILIRUBINUR n 11/25/2015  1712   KETONESUR NEGATIVE 06/20/2022 1434   PROTEINUR 100 (A) 06/20/2022 1434   UROBILINOGEN negative 11/25/2015 1712   NITRITE NEGATIVE 06/20/2022 1434   LEUKOCYTESUR TRACE (A) 06/20/2022 1434   CXR = no active disease  CT AP: IMPRESSION: Distal esophageal wall thickening, may suggest esophagitis.   Moderate to large rectal stool burden. No bowel obstruction or other acute findings in the abdomen or pelvis.   Chronic compression deformity of T12 without evidence of progressive height loss. Prior fusion from L1-S1 with unchanged mild residual retrolisthesis at L1-L2. Mild lucency along the L1 pedicle screws.  Assessment and Plan: * Severe  sepsis with acute organ dysfunction ([place]) Sepsis with lactic acidosis and initial hypotension, resolved following IVF bolus. 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 -Question if today represents MSSA bacteremia / metastasis of MSSA to another site? -No other obvious symptoms to suggest source at this point other than N/V/D -? C diff perhaps? -Or could be that pt simply had hypotension and lactic acidosis, not from sepsis but from dehydration / orthostasis in setting of N/V/D and 100 lbs of wt loss over past year from ozempic. Sepsis pathway Continue empiric cefepime / flagyl / vanc Tele monitor BCx pending Serial lactates IVF C.Diff ordered and pending, if positive will need to switch ABx. GI pathogen panel procalcitonin  AKI (acute kidney injury) ([place]) Mild AKI, Creat today of 1.3, looks like she was 0.9 in Dec. IVF Strict intake and output Repeat BMP in AM Replace Mg and K  Type II diabetes mellitus with complication ([place]) Hold ozempic for the moment given vomiting and diarrhea Sensitive SSI Q4H.  Essential hypertension Initial hypotension in ED in setting of sepsis Holding all home BP meds      Advance Care Planning:   Code Status: Full Code  Consults: None  Family  Communication: No family in room  Severity of Illness: The appropriate patient status for this patient is INPATIENT. Inpatient status is judged to be reasonable and necessary in order to provide the required intensity of service to ensure the patient's safety. The patient's presenting symptoms, physical exam findings, and initial radiographic and laboratory data in the context of their chronic comorbidities is felt to place them at high risk for further clinical deterioration. Furthermore, it is not anticipated that the patient will be medically stable for discharge from the hospital within 2 midnights of admission.   * I certify that at the point of admission it is my clinical judgment that the patient will require inpatient hospital care spanning beyond 2 midnights from the point of admission due to high intensity of service, high risk for further deterioration and high frequency of surveillance required.*  Author: Etta Quill., DO 06/20/2022 11:02 PM  For on call review [email or web address].

## 2022-06-20 NOTE — Assessment & Plan Note (Addendum)
Hold ozempic for the moment given vomiting and diarrhea Sensitive SSI Q4H.

## 2022-06-20 NOTE — ED Notes (Signed)
Pt used bedside commode with assistance, able to obtain urine sample however minimal urine output

## 2022-06-20 NOTE — Assessment & Plan Note (Signed)
Initial hypotension in ED in setting of sepsis Holding all home BP meds

## 2022-06-20 NOTE — Progress Notes (Signed)
Notified provider and bedside nurse of need to order and administer fluid bolus pt needs 2478 cc.

## 2022-06-21 DIAGNOSIS — R6520 Severe sepsis without septic shock: Secondary | ICD-10-CM | POA: Diagnosis not present

## 2022-06-21 DIAGNOSIS — A419 Sepsis, unspecified organism: Secondary | ICD-10-CM | POA: Diagnosis not present

## 2022-06-21 LAB — CBC
HCT: 37.2 % (ref 36.0–46.0)
Hemoglobin: 12 g/dL (ref 12.0–15.0)
MCH: 31.2 pg (ref 26.0–34.0)
MCHC: 32.3 g/dL (ref 30.0–36.0)
MCV: 96.6 fL (ref 80.0–100.0)
Platelets: 169 10*3/uL (ref 150–400)
RBC: 3.85 MIL/uL — ABNORMAL LOW (ref 3.87–5.11)
RDW: 14.7 % (ref 11.5–15.5)
WBC: 6.1 10*3/uL (ref 4.0–10.5)
nRBC: 0 % (ref 0.0–0.2)

## 2022-06-21 LAB — COMPREHENSIVE METABOLIC PANEL
ALT: 14 U/L (ref 0–44)
AST: 18 U/L (ref 15–41)
Albumin: 2.8 g/dL — ABNORMAL LOW (ref 3.5–5.0)
Alkaline Phosphatase: 67 U/L (ref 38–126)
Anion gap: 7 (ref 5–15)
BUN: 18 mg/dL (ref 8–23)
CO2: 28 mmol/L (ref 22–32)
Calcium: 8.9 mg/dL (ref 8.9–10.3)
Chloride: 101 mmol/L (ref 98–111)
Creatinine, Ser: 1.08 mg/dL — ABNORMAL HIGH (ref 0.44–1.00)
GFR, Estimated: 54 mL/min — ABNORMAL LOW (ref >60)
Glucose, Bld: 78 mg/dL (ref 70–99)
Potassium: 2.7 mmol/L — CL (ref 3.5–5.1)
Sodium: 136 mmol/L (ref 135–145)
Total Bilirubin: 0.8 mg/dL (ref 0.3–1.2)
Total Protein: 5.1 g/dL — ABNORMAL LOW (ref 6.5–8.1)

## 2022-06-21 LAB — PROCALCITONIN: Procalcitonin: <0.1 ng/mL

## 2022-06-21 LAB — GLUCOSE, CAPILLARY
Glucose-Capillary: 65 mg/dL — ABNORMAL LOW (ref 70–99)
Glucose-Capillary: 66 mg/dL — ABNORMAL LOW (ref 70–99)
Glucose-Capillary: 67 mg/dL — ABNORMAL LOW (ref 70–99)
Glucose-Capillary: 73 mg/dL (ref 70–99)
Glucose-Capillary: 79 mg/dL (ref 70–99)
Glucose-Capillary: 80 mg/dL (ref 70–99)
Glucose-Capillary: 82 mg/dL (ref 70–99)
Glucose-Capillary: 91 mg/dL (ref 70–99)
Glucose-Capillary: 97 mg/dL (ref 70–99)

## 2022-06-21 LAB — PROTIME-INR
INR: 1 (ref 0.8–1.2)
Prothrombin Time: 13.2 seconds (ref 11.4–15.2)

## 2022-06-21 LAB — C DIFFICILE QUICK SCREEN W PCR REFLEX
C Diff antigen: NEGATIVE
C Diff interpretation: NOT DETECTED
C Diff toxin: NEGATIVE

## 2022-06-21 LAB — MAGNESIUM: Magnesium: 1.8 mg/dL (ref 1.7–2.4)

## 2022-06-21 LAB — CORTISOL-AM, BLOOD: Cortisol - AM: 5.1 ug/dL — ABNORMAL LOW (ref 6.7–22.6)

## 2022-06-21 MED ORDER — DIPHENHYDRAMINE HCL 25 MG PO CAPS
25.0000 mg | ORAL_CAPSULE | Freq: Once | ORAL | Status: AC
  Administered 2022-06-21: 25 mg via ORAL
  Filled 2022-06-21: qty 1

## 2022-06-21 MED ORDER — TOPIRAMATE 25 MG PO TABS
50.0000 mg | ORAL_TABLET | Freq: Every day | ORAL | Status: DC
  Administered 2022-06-21 – 2022-06-28 (×8): 50 mg via ORAL
  Filled 2022-06-21 (×8): qty 2

## 2022-06-21 MED ORDER — METOPROLOL TARTRATE 25 MG PO TABS
12.5000 mg | ORAL_TABLET | Freq: Two times a day (BID) | ORAL | Status: DC
  Administered 2022-06-21 – 2022-06-23 (×6): 12.5 mg via ORAL
  Filled 2022-06-21 (×7): qty 1

## 2022-06-21 MED ORDER — ACETAMINOPHEN 325 MG PO TABS
650.0000 mg | ORAL_TABLET | Freq: Once | ORAL | Status: AC
  Administered 2022-06-21: 650 mg via ORAL
  Filled 2022-06-21: qty 2

## 2022-06-21 MED ORDER — ATORVASTATIN CALCIUM 40 MG PO TABS
80.0000 mg | ORAL_TABLET | Freq: Every day | ORAL | Status: DC
  Administered 2022-06-21 – 2022-06-29 (×9): 80 mg via ORAL
  Filled 2022-06-21 (×9): qty 2

## 2022-06-21 MED ORDER — PRIMIDONE 50 MG PO TABS
50.0000 mg | ORAL_TABLET | Freq: Two times a day (BID) | ORAL | Status: DC
  Administered 2022-06-21 – 2022-06-29 (×17): 50 mg via ORAL
  Filled 2022-06-21 (×20): qty 1

## 2022-06-21 MED ORDER — POTASSIUM CHLORIDE 20 MEQ PO PACK
40.0000 meq | PACK | Freq: Once | ORAL | Status: AC
  Administered 2022-06-21: 40 meq via ORAL
  Filled 2022-06-21: qty 2

## 2022-06-21 MED ORDER — HYDROCODONE-ACETAMINOPHEN 5-325 MG PO TABS
1.0000 | ORAL_TABLET | ORAL | Status: DC | PRN
  Administered 2022-06-21 – 2022-06-29 (×16): 1 via ORAL
  Filled 2022-06-21 (×17): qty 1

## 2022-06-21 MED ORDER — ALLOPURINOL 300 MG PO TABS
300.0000 mg | ORAL_TABLET | Freq: Every day | ORAL | Status: DC
  Administered 2022-06-21 – 2022-06-29 (×9): 300 mg via ORAL
  Filled 2022-06-21 (×9): qty 1

## 2022-06-21 MED ORDER — DULOXETINE HCL 60 MG PO CPEP
60.0000 mg | ORAL_CAPSULE | Freq: Every day | ORAL | Status: DC
  Administered 2022-06-21 – 2022-06-29 (×9): 60 mg via ORAL
  Filled 2022-06-21 (×9): qty 1

## 2022-06-21 MED ORDER — VANCOMYCIN HCL 1250 MG/250ML IV SOLN
1250.0000 mg | INTRAVENOUS | Status: DC
  Administered 2022-06-21: 1250 mg via INTRAVENOUS
  Filled 2022-06-21 (×2): qty 250

## 2022-06-21 MED ORDER — ROPINIROLE HCL 1 MG PO TABS
1.0000 mg | ORAL_TABLET | Freq: Every evening | ORAL | Status: DC | PRN
  Administered 2022-06-21: 1 mg via ORAL
  Filled 2022-06-21: qty 1

## 2022-06-21 MED ORDER — LEVOTHYROXINE SODIUM 50 MCG PO TABS
75.0000 ug | ORAL_TABLET | Freq: Every day | ORAL | Status: DC
  Administered 2022-06-22 – 2022-06-24 (×3): 75 ug via ORAL
  Filled 2022-06-21 (×3): qty 1

## 2022-06-21 NOTE — Consult Note (Signed)
[facility]  Total days of antibiotics 2       Reason for Consult: sepsis   Referring Physician: Shelly Coss  Principal Problem:   Severe sepsis with acute organ dysfunction [facility]) Active Problems:   Essential hypertension   AKI (acute kidney injury) ([place])   Type II diabetes mellitus with complication (HCC)   HPI: Brandi Stephens is a 77 y.o. 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 She reports in the last 2 weeks having increasing nausea, vomiting and diarrhea, instability of legs, frequent falls. She has a holter monitor in place through the 3/28 for observation. Due to worsening abdominal discomofrt, she was admitted for evaluation, she had hypotension, LA 2.9, and started on empiric abtx. Abd CT was unremarkable. ID asked to weigh in on work up. Patient is feeling better since admission. No diarrhea.  Past Medical History:  Diagnosis Date   AIN (anal intraepithelial neoplasia) anal canal    Anemia    with pregnancy   Anxiety    Arthritis    knees, lower back, knees   Chronic constipation    Chronic diastolic CHF (congestive heart failure) ([place]) 07/05/2019   CKD (chronic kidney disease)    Coarse tremors    Essential   COPD (chronic obstructive pulmonary disease) ([place])    2017 chest CT, history of   Degenerative lumbar spinal stenosis    Depression    Depression 07/05/2019   Drug dependency ([place])    Elevated PTHrP level 05/09/2019   EtOH use 07/05/2019   Pt denies heavy drinking however   GERD (gastroesophageal reflux disease)    pt denies   Gout    Grade I diastolic dysfunction 0000000   Noted ECHO   Hemorrhoids    History of adenomatous polyp of colon    History of basal cell carcinoma (BCC) excision 2015   nose   History of sepsis 05/14/2014   post lumbar surgery   History of shingles    x2    Hyperlipidemia    Hypertension    Hypothyroidism    Irregular heart beat    history of while undergoing radiation   Malignant neoplasm of upper-inner quadrant of left breast in female, estrogen receptor positive [facility]) oncologist-  dr Jana Hakim--  per lov in epic no recurrence   dx 07-13-2015  Left breast invasive ductal carcinoma, Stage IA, Grade 1 (TXN0),  09-16-2015  s/p  left breast lumpectomy with sln bx's,  completed radiation 11-18-2015,  started antiestrogen therapy 12-14-2015   Neuropathy    Left foot and back of left leg   Obesity (BMI 30-39.9) 07/05/2019   Personal history of radiation therapy    completed 11-18-2015  left breast   Pneumonia    PONV (postoperative nausea and vomiting)    Prediabetes    diet controlled, no med   Restless leg syndrome    Seasonal allergies    Seizure ([place]) 05/2015   due to sepsis, only one time episode   Tremor    Wears partial dentures    lower     Allergies:  Allergies  Allergen Reactions   Ampicillin Hives and Other (See Comments)    Severe reaction in February 2017 1.5 month to have hives to go away   Gabapentin Swelling    UNSPECIFIED REACTION    Penicillins Hives and  Other (See Comments)    Severe reaction in February 2017 1.5 month to have hives to go away Has patient had a PCN reaction causing immediate rash, facial/tongue/throat swelling, SOB or lightheadedness with hypotension: #  #  #  YES  #  #  #  Has patient had a PCN reaction causing severe rash involving mucus membranes or skin necrosis: No Has patient had a PCN reaction that required hospitalization No Has patient had a PCN reaction occurring within the last 10 years: No    Other Itching    UNSPECIFIED Analgesics     MEDICATIONS:  allopurinol  300 mg Oral Daily   amitriptyline  100 mg Oral QHS   atorvastatin  80 mg Oral Daily   DULoxetine  60 mg Oral Daily   enoxaparin (LOVENOX) injection  40 mg Subcutaneous Q24H   insulin aspart  0-9 Units Subcutaneous Q4H    [START ON 06/22/2022] levothyroxine  75 mcg Oral Q0600   metoprolol tartrate  12.5 mg Oral BID   primidone  50 mg Oral BID   topiramate  50 mg Oral QHS    Social History   Tobacco Use   Smoking status: Former    Packs/day: 0.50    Years: 35.00    Total pack years: 17.50    Types: Cigarettes    Quit date: 05/22/2018    Years since quitting: 4.0   Smokeless tobacco: Never  Vaping Use   Vaping Use: Never used  Substance Use Topics   Alcohol use: Yes    Comment: 1 glass of wine a night   Drug use: Not Currently    Comment: CBD tincture    Family History  Problem Relation Age of Onset   Atrial fibrillation Mother    Ulcers Father    Breast cancer Maternal Aunt    Lung cancer Maternal Uncle    Stomach cancer Maternal Grandmother    Lung cancer Maternal Aunt    Brain cancer Maternal Aunt    Prostate cancer Maternal Grandfather    Stroke Paternal Grandmother    Tuberculosis Paternal Grandfather    Colon cancer Neg Hx    Esophageal cancer Neg Hx    Rectal cancer Neg Hx     Review of Systems - per hpi   OBJECTIVE: Temp:  [97.7 F (36.5 C)-98.4 F (36.9 C)] 97.9 F (36.6 C) (03/05 1158) Pulse Rate:  [81-101] 81 (03/05 1158) Resp:  [16-20] 16 (03/05 1158) BP: (107-133)/(41-92) 113/71 (03/05 1158) SpO2:  [93 %-100 %] 97 % (03/05 1158) Weight:  [86.6 kg] 86.6 kg (03/04 2039) Physical Exam  Constitutional:  oriented to person, place, and time. appears well-developed and well-nourished. No distress.  HENT: [place]/AT, PERRLA, no scleral icterus Mouth/Throat: Oropharynx is clear and moist. No oropharyngeal exudate.  Cardiovascular: Normal rate, regular rhythm and normal heart sounds. Exam reveals no gallop and no friction rub.  No murmur heard.  Pulmonary/Chest: Effort normal and breath sounds normal. No respiratory distress.  has no wheezes.  Neck = supple, no nuchal rigidity Abdominal: Soft. Bowel sounds are normal.  exhibits no distension. There is no tenderness.   Lymphadenopathy: no cervical adenopathy. No axillary adenopathy Neurological: alert and oriented to person, place, and time.  Skin: Skin is warm and dry. No rash noted. No erythema.  Psychiatric: a normal mood and affect.  behavior is normal.   LABS: Results for orders placed or performed during the hospital encounter of 06/20/22 (from the past 48 hour(s))  Comprehensive metabolic panel  Status: Abnormal   Collection Time: 06/20/22 11:13 AM  Result Value Ref Range   Sodium 143 135 - 145 mmol/L   Potassium 3.2 (L) 3.5 - 5.1 mmol/L   Chloride 97 (L) 98 - 111 mmol/L   CO2 31 22 - 32 mmol/L   Glucose, Bld 132 (H) 70 - 99 mg/dL    Comment: Glucose reference range applies only to samples taken after fasting for at least 8 hours.   BUN 23 8 - 23 mg/dL   Creatinine, Ser 1.30 (H) 0.44 - 1.00 mg/dL   Calcium 11.6 (H) 8.9 - 10.3 mg/dL   Total Protein 7.3 6.5 - 8.1 g/dL   Albumin 4.2 3.5 - 5.0 g/dL   AST 20 15 - 41 U/L   ALT 13 0 - 44 U/L   Alkaline Phosphatase 84 38 - 126 U/L   Total Bilirubin 0.8 0.3 - 1.2 mg/dL   GFR, Estimated 43 (L) >60 mL/min    Comment: (NOTE) Calculated using the CKD-EPI Creatinine Equation (2021)    Anion gap 15 5 - 15    Comment: Performed at [facility], [place], [place] [address]  Lactic acid, plasma     Status: Abnormal   Collection Time: 06/20/22 11:13 AM  Result Value Ref Range   Lactic Acid, Venous 2.9 (HH) 0.5 - 1.9 mmol/L    Comment: CRITICAL RESULT CALLED TO, READ BACK BY AND VERIFIED WITH: Joesph Fillers, RN [phone] 06/20/2022 DBRADLEY Performed at [facility], [place], [place], [place] [address]   CBC with Differential     Status: Abnormal   Collection Time: 06/20/22 11:13 AM  Result Value Ref Range   WBC 11.0 (H) 4.0 - 10.5 K/uL   RBC 5.17 (H) 3.87 - 5.11 MIL/uL   Hemoglobin 16.2 (H) 12.0 - 15.0 g/dL   HCT 48.5 (H) 36.0 - 46.0 %   MCV 93.8 80.0 - 100.0 fL   MCH 31.3 26.0 - 34.0 pg    MCHC 33.4 30.0 - 36.0 g/dL   RDW 14.8 11.5 - 15.5 %   Platelets 272 150 - 400 K/uL   nRBC 0.0 0.0 - 0.2 %   Neutrophils Relative % 74 %   Neutro Abs 8.3 (H) 1.7 - 7.7 K/uL   Lymphocytes Relative 14 %   Lymphs Abs 1.5 0.7 - 4.0 K/uL   Monocytes Relative 7 %   Monocytes Absolute 0.8 0.1 - 1.0 K/uL   Eosinophils Relative 3 %   Eosinophils Absolute 0.3 0.0 - 0.5 K/uL   Basophils Relative 1 %   Basophils Absolute 0.1 0.0 - 0.1 K/uL   Immature Granulocytes 1 %   Abs Immature Granulocytes 0.05 0.00 - 0.07 K/uL    Comment: Performed at [facility], [address], [place], [place] [address]  Protime-INR     Status: None   Collection Time: 06/20/22 11:13 AM  Result Value Ref Range   Prothrombin Time 12.5 11.4 - 15.2 seconds   INR 0.9 0.8 - 1.2    Comment: (NOTE) INR goal varies based on device and disease states. Performed at [facility], [address] [place], [place] [address]   Troponin I (High Sensitivity)     Status: None   Collection Time: 06/20/22 11:13 AM  Result Value Ref Range   Troponin I (High Sensitivity) 16 <18 ng/L    Comment: (NOTE) Elevated high sensitivity troponin I (hsTnI) values and significant  changes across serial measurements may suggest ACS but  many other  chronic and acute conditions are known to elevate hsTnI results.  Refer to the "Links" section for chest pain algorithms and additional  guidance. Performed at [facility], [address] [place], [place] [address]   Culture, blood (Routine x 2)     Status: None (Preliminary result)   Collection Time: 06/20/22 11:18 AM   Specimen: Right Antecubital; Blood  Result Value Ref Range   Specimen Description      RIGHT ANTECUBITAL BLOOD Performed at [facility] [address], [place], [place] [address]    Special Requests      BOTTLES DRAWN AEROBIC AND ANAEROBIC Blood Culture adequate volume Performed at [facility], [address], [place], [place] [address]    Culture      NO GROWTH < 24 HOURS Performed at [facility], [place] [address], [place], [place] [address]    Report Status PENDING   Culture, blood (Routine x 2)     Status: None (Preliminary result)   Collection Time: 06/20/22 12:20 PM   Specimen: BLOOD RIGHT HAND  Result Value Ref Range   Specimen Description      BLOOD RIGHT HAND Performed at [facility], [address] [place], [place] [address]    Special Requests      BOTTLES DRAWN AEROBIC AND ANAEROBIC Blood Culture adequate volume Performed at [facility], [address] [place], [place] [address]    Culture      NO GROWTH < 24 HOURS Performed at [facility] [address], [place], [place] [address]    Report Status PENDING   Lactic acid, plasma     Status: Abnormal   Collection Time: 06/20/22  1:43 PM  Result Value Ref Range   Lactic Acid, Venous 2.8 (HH) 0.5 - 1.9 mmol/L    Comment: CRITICAL RESULT CALLED TO, READ BACK BY AND VERIFIED WITH: Veneda Melter [id number] 06/20/2022 DBRADLEY Performed at [facility], [address] [place],  [address]   Resp panel by RT-PCR (RSV, Flu A&B, Covid) Anterior Nasal Swab     Status: None   Collection Time: 06/20/22  1:43 PM   Specimen: Anterior Nasal Swab  Result Value Ref Range   SARS Coronavirus 2 by RT PCR NEGATIVE NEGATIVE    Comment: (NOTE) SARS-CoV-2 target nucleic acids are NOT DETECTED.  The SARS-CoV-2 RNA is generally detectable in upper respiratory specimens during the acute phase of infection. The lowest concentration of SARS-CoV-2 viral copies this assay can detect is 138 copies/mL. A negative result does not preclude SARS-Cov-2 infection and should not be used as the sole basis for treatment or other patient management decisions. A negative result may occur with  improper specimen collection/handling, submission of specimen  other than nasopharyngeal swab, presence of viral mutation(s) within the areas targeted by this assay, and inadequate number of viral copies(<138 copies/mL). A negative result must be combined with clinical observations, patient history, and epidemiological information. The expected result is Negative.  Fact Sheet for Patients:  EntrepreneurPulse.com.au  Fact Sheet for Healthcare Providers:  IncredibleEmployment.be  This test is no t yet approved or cleared by the [place] FDA and  has been authorized for detection and/or diagnosis of SARS-CoV-2 by FDA under an Emergency Use Authorization (EUA). This EUA will remain  in effect (meaning this test can be used) for the duration of the COVID-19 declaration under Section 564(b)(1) of the Act, 21 U.S.C.section 360bbb-3(b)(1), unless the authorization  is terminated  or revoked sooner.       Influenza A by PCR NEGATIVE NEGATIVE   Influenza B by PCR NEGATIVE NEGATIVE    Comment: (NOTE) The Xpert Xpress SARS-CoV-2/FLU/RSV plus assay is intended as an aid in the diagnosis of influenza from Nasopharyngeal swab specimens and should not be used as a sole basis for treatment. Nasal washings and aspirates are unacceptable for Xpert Xpress SARS-CoV-2/FLU/RSV testing.  Fact Sheet for Patients: EntrepreneurPulse.com.au  Fact Sheet for Healthcare Providers: IncredibleEmployment.be  This test is not yet approved or cleared by the [place] FDA and has been authorized for detection and/or diagnosis of SARS-CoV-2 by FDA under an Emergency Use Authorization (EUA). This EUA will remain in effect (meaning this test can be used) for the duration of the COVID-19 declaration under Section 564(b)(1) of the Act, 21 U.S.C. section 360bbb-3(b)(1), unless the authorization is terminated or revoked.     Resp Syncytial Virus by PCR NEGATIVE NEGATIVE    Comment: (NOTE) Fact  Sheet for Patients: EntrepreneurPulse.com.au  Fact Sheet for Healthcare Providers: IncredibleEmployment.be  This test is not yet approved or cleared by the [place] FDA and has been authorized for detection and/or diagnosis of SARS-CoV-2 by FDA under an Emergency Use Authorization (EUA). This EUA will remain in effect (meaning this test can be used) for the duration of the COVID-19 declaration under Section 564(b)(1) of the Act, 21 U.S.C. section 360bbb-3(b)(1), unless the authorization is terminated or revoked.  Performed at [facility], [address], [place], [place] [address]   APTT     Status: None   Collection Time: 06/20/22  2:34 PM  Result Value Ref Range   aPTT 25 24 - 36 seconds    Comment: Performed at [facility], [address] [place], [place] [address]  Urinalysis, w/ Reflex to Culture (Infection Suspected) -Urine, Clean Catch     Status: Abnormal   Collection Time: 06/20/22  2:34 PM  Result Value Ref Range   Specimen Source URINE, CLEAN CATCH    Color, Urine YELLOW YELLOW   APPearance HAZY (A) CLEAR   Specific Gravity, Urine >1.046 (H) 1.005 - 1.030   pH 8.5 (H) 5.0 - 8.0   Glucose, UA NEGATIVE NEGATIVE mg/dL   Hgb urine dipstick NEGATIVE NEGATIVE   Bilirubin Urine NEGATIVE NEGATIVE   Ketones, ur NEGATIVE NEGATIVE mg/dL   Protein, ur 100 (A) NEGATIVE mg/dL   Nitrite NEGATIVE NEGATIVE   Leukocytes,Ua TRACE (A) NEGATIVE   RBC / HPF 0-5 0 - 5 RBC/hpf   WBC, UA 0-5 0 - 5 WBC/hpf    Comment:        Reflex urine culture not performed if WBC <=10, OR if Squamous epithelial cells >5. If Squamous epithelial cells >5 suggest recollection.    Bacteria, UA NONE SEEN NONE SEEN   Squamous Epithelial / HPF >50 0 - 5 /HPF   Mucus PRESENT     Comment: Performed at [facility], [place], [place] [address]  Troponin I (High Sensitivity)     Status: None    Collection Time: 06/20/22  2:34 PM  Result Value Ref Range   Troponin I (High Sensitivity) 13 <18 ng/L    Comment: (NOTE) Elevated high sensitivity troponin I (hsTnI) values and significant  changes across serial measurements may suggest ACS but many other  chronic and acute conditions are known to elevate hsTnI results.  Refer to the "Links" section for chest pain algorithms and additional  guidance.  Performed at [facility], [address] [place], [place] [address]   Lactic acid, plasma     Status: Abnormal   Collection Time: 06/20/22  3:36 PM  Result Value Ref Range   Lactic Acid, Venous 2.6 (HH) 0.5 - 1.9 mmol/L    Comment: CRITICAL RESULT CALLED TO, READ BACK BY AND VERIFIED WITH: Veneda Melter [id number] 06/20/2022 [facility] Performed at [facility], [address], [place], [place] [address]   Magnesium     Status: Abnormal   Collection Time: 06/20/22  3:36 PM  Result Value Ref Range   Magnesium 1.4 (L) 1.7 - 2.4 mg/dL    Comment: Performed at [facility], [address] [place], [place] [address]  Lactic acid, plasma     Status: None   Collection Time: 06/20/22  5:30 PM  Result Value Ref Range   Lactic Acid, Venous 1.9 0.5 - 1.9 mmol/L    Comment: Performed at [facility], [address] [place], [place] [address]  Glucose, capillary     Status: Abnormal   Collection Time: 06/21/22 12:02 AM  Result Value Ref Range   Glucose-Capillary 65 (L) 70 - 99 mg/dL    Comment: Glucose reference range applies only to samples taken after fasting for at least 8 hours.  Glucose, capillary     Status: Abnormal   Collection Time: 06/21/22 12:46 AM  Result Value Ref Range   Glucose-Capillary 67 (L) 70 - 99 mg/dL    Comment: Glucose reference range applies only to samples taken after fasting for at least 8 hours.  Protime-INR     Status: None   Collection Time: 06/21/22 12:51 AM  Result Value Ref Range    Prothrombin Time 13.2 11.4 - 15.2 seconds   INR 1.0 0.8 - 1.2    Comment: (NOTE) INR goal varies based on device and disease states. Performed at [facility], [place] [address] [place], [place] [address]   Cortisol-am, blood     Status: Abnormal   Collection Time: 06/21/22 12:51 AM  Result Value Ref Range   Cortisol - AM 5.1 (L) 6.7 - 22.6 ug/dL    Comment: Performed at [facility] [address] [place], [place] [address]  CBC     Status: Abnormal   Collection Time: 06/21/22 12:51 AM  Result Value Ref Range   WBC 6.1 4.0 - 10.5 K/uL   RBC 3.85 (L) 3.87 - 5.11 MIL/uL   Hemoglobin 12.0 12.0 - 15.0 g/dL   HCT 37.2 36.0 - 46.0 %   MCV 96.6 80.0 - 100.0 fL   MCH 31.2 26.0 - 34.0 pg   MCHC 32.3 30.0 - 36.0 g/dL   RDW 14.7 11.5 - 15.5 %   Platelets 169 150 - 400 K/uL   nRBC 0.0 0.0 - 0.2 %    Comment: Performed at [facility], [place] [address], [place], [place] [address]  Comprehensive metabolic panel     Status: Abnormal   Collection Time: 06/21/22 12:51 AM  Result Value Ref Range   Sodium 136 135 - 145 mmol/L   Potassium 2.7 (LL) 3.5 - 5.1 mmol/L    Comment: CRITICAL RESULT CALLED TO, READ BACK BY AND VERIFIED WITH KIM,P RN AT 0146 ON 06/21/22 BY [email or web address]    Chloride 101 98 - 111 mmol/L   CO2 28 22 - 32 mmol/L   Glucose, Bld 78 70 - 99 mg/dL    Comment: Glucose reference range applies only  to samples taken after fasting for at least 8 hours.   BUN 18 8 - 23 mg/dL   Creatinine, Ser 1.08 (H) 0.44 - 1.00 mg/dL   Calcium 8.9 8.9 - 10.3 mg/dL   Total Protein 5.1 (L) 6.5 - 8.1 g/dL   Albumin 2.8 (L) 3.5 - 5.0 g/dL   AST 18 15 - 41 U/L   ALT 14 0 - 44 U/L   Alkaline Phosphatase 67 38 - 126 U/L   Total Bilirubin 0.8 0.3 - 1.2 mg/dL   GFR, Estimated 54 (L) >60 mL/min    Comment: (NOTE) Calculated using the CKD-EPI Creatinine Equation (2021)    Anion gap 7 5 - 15    Comment: Performed at [facility], [place] [address] [place], [place] [address]  Magnesium     Status: None   Collection Time: 06/21/22 12:51 AM  Result Value Ref Range   Magnesium 1.8 1.7 - 2.4 mg/dL    Comment: Performed at [facility], [place] [address], [place], [place] [address]  Procalcitonin     Status: None   Collection Time: 06/21/22 12:51 AM  Result Value Ref Range   Procalcitonin <0.10 ng/mL    Comment:        Interpretation: PCT (Procalcitonin) <= 0.5 ng/mL: Systemic infection (sepsis) is not likely. Local bacterial infection is possible. (NOTE)       Sepsis PCT Algorithm           Lower Respiratory Tract                                      Infection PCT Algorithm    ----------------------------     ----------------------------         PCT < 0.25 ng/mL                PCT < 0.10 ng/mL          Strongly encourage             Strongly discourage   discontinuation of antibiotics    initiation of antibiotics    ----------------------------     -----------------------------       PCT 0.25 - 0.50 ng/mL            PCT 0.10 - 0.25 ng/mL               OR       >80% decrease in PCT            Discourage initiation of                                            antibiotics      Encourage discontinuation           of antibiotics    ----------------------------     -----------------------------         PCT >= 0.50 ng/mL              PCT 0.26 - 0.50 ng/mL               AND        <80% decrease in PCT             Encourage initiation of  antibiotics       Encourage continuation           of antibiotics    ----------------------------     -----------------------------        PCT >= 0.50 ng/mL                  PCT > 0.50 ng/mL               AND         increase in PCT                  Strongly encourage                                      initiation of antibiotics    Strongly encourage escalation           of antibiotics                                      -----------------------------                                           PCT <= 0.25 ng/mL                                                 OR                                        > 80% decrease in PCT                                      Discontinue / Do not initiate                                             antibiotics  Performed at [facility] [address], [place], [place] [address]   Glucose, capillary     Status: None   Collection Time: 06/21/22  1:17 AM  Result Value Ref Range   Glucose-Capillary 80 70 - 99 mg/dL    Comment: Glucose reference range applies only to samples taken after fasting for at least 8 hours.  Glucose, capillary     Status: None   Collection Time: 06/21/22  4:39 AM  Result Value Ref Range   Glucose-Capillary 97 70 - 99 mg/dL    Comment: Glucose reference range applies only to samples taken after fasting for at least 8 hours.  Glucose, capillary     Status: None   Collection Time: 06/21/22  7:43 AM  Result Value Ref Range   Glucose-Capillary 79 70 - 99 mg/dL    Comment: Glucose reference range applies only to samples taken after fasting for at least 8 hours.  C Difficile Quick Screen w PCR reflex  Status: None   Collection Time: 06/21/22  9:09 AM  Result Value Ref Range   C Diff antigen NEGATIVE NEGATIVE   C Diff toxin NEGATIVE NEGATIVE   C Diff interpretation No C. difficile detected.     Comment: Performed at [facility], [place] [address], [place], [place] [address]  Glucose, capillary     Status: None   Collection Time: 06/21/22 11:53 AM  Result Value Ref Range   Glucose-Capillary 91 70 - 99 mg/dL    Comment: Glucose reference range applies only to samples taken after fasting for at least 8 hours.   *Note: Due to a large number of results and/or encounters for the requested time period, some results have not been displayed. A complete set of results can be found in Results Review.     MICRO:  IMAGING: CT ABDOMEN PELVIS W CONTRAST  Result Date: 06/20/2022 CLINICAL DATA:  Bowel obstruction suspected N/V for 2 weeks, hx of lumbar osteomyelitis EXAM: CT ABDOMEN AND PELVIS WITH CONTRAST TECHNIQUE: Multidetector CT imaging of the abdomen and pelvis was performed using the standard protocol following bolus administration of intravenous contrast. RADIATION DOSE REDUCTION: This exam was performed according to the departmental dose-optimization program which includes automated exposure control, adjustment of the mA and/or kV according to patient size and/or use of iterative reconstruction technique. CONTRAST:  4m OMNIPAQUE IOHEXOL 350 MG/ML SOLN COMPARISON:  CT 07/09/2019 FINDINGS: Lower chest: Lingular scarring/subsegmental atelectasis. Unchanged subendocardial hypoattenuation/fat density in the left ventricular septum and in the right ventricle. Hepatobiliary: No focal liver abnormality is seen. The gallbladder is unremarkable. Pancreas: Unremarkable. No pancreatic ductal dilatation or surrounding inflammatory changes. Spleen: Normal in size without focal abnormality. Adrenals/Urinary Tract: Adrenal glands are unremarkable. No hydronephrosis or nephroureterolithiasis. Small bilateral renal cysts which appear simple require no follow-up imaging. Bladder is minimally distended. Stomach/Bowel: There is distal esophageal wall thickening. The stomach is within normal limits. There is no evidence of bowel obstruction.Prior appendectomy. Moderate to large rectal stool burden. Sigmoid diverticulosis. No acute diverticulitis. Vascular/Lymphatic: Aortoiliac atherosclerosis. No AAA. No lymphadenopathy. Reproductive: Prior hysterectomy. Other: No abdominal wall hernia or abnormality. No abdominopelvic ascites. Musculoskeletal: No acute osseous abnormality. No suspicious osseous lesion. There is a chronic compression deformity of T12 without evidence of progressive height loss. Prior fusion from L1-S1  with unchanged mild residual retrolisthesis at L1-L2. There is mild lucency along the L1 pedicle screws. Hardware is intact. IMPRESSION: Distal esophageal wall thickening, may suggest esophagitis. Moderate to large rectal stool burden. No bowel obstruction or other acute findings in the abdomen or pelvis. Chronic compression deformity of T12 without evidence of progressive height loss. Prior fusion from L1-S1 with unchanged mild residual retrolisthesis at L1-L2. Mild lucency along the L1 pedicle screws. Electronically Signed   By: JMaurine SimmeringM.D.   On: 06/20/2022 13:52   DG Chest Port 1 View  Result Date: 06/20/2022 CLINICAL DATA:  Vomiting for 2 weeks. EXAM: PORTABLE CHEST 1 VIEW COMPARISON:  CT chest and chest x-ray dated January 18, 2021. FINDINGS: New loop recorder noted. Heart remains at the upper limits of normal in size. Normal pulmonary vascularity. Chronic emphysematous changes and mildly coarsened interstitial markings are similar. No focal consolidation, pleural effusion, or pneumothorax. No acute osseous abnormality. IMPRESSION: 1. No active disease. 2. COPD. Electronically Signed   By: WTitus DubinM.D.   On: 06/20/2022 12:49     Assessment/Plan:  [id number] with hx of mssa chronic osteo on suppressive therapy admitted for gi symptoms x 2  wks found to have hypotension, mild lactic acidosis concern for sepsis now on empiric abtx. - continue with empiric abtx through tomorrow to reassess if positive blood cx - suspect she was hypovolemic that could account for her symptoms - await for GI pathogen panel results to see if that is helpful, continue with supportive care - reassuring that she is afebrile, no leukocytosis, procalcitonin is normal.

## 2022-06-21 NOTE — Progress Notes (Signed)
PROGRESS NOTE    Brandi Stephens  [id number] DOB: 02/08/1946 DOA: 06/20/2022 PCP: Carlena Hurl, PA-C   Brief Narrative:  This 77 years old female with PMH significant for DM 2, hypertension, chronic osteomyelitis of L-spine with MSSA on suppressive antibiotics.  She has lost about 100 pounds in the last year due to [place].  She presented in the ED with 2 weeks history of nausea , vomiting and diarrhea.  Denies any fever, chills, headache, rash, URI symptoms, productive cough.  She also reports some abdominal discomfort. Workup in the ED reveals lactic acid 2.9, She was hypotensive which has improved with IV fluid resuscitation.  She is admitted for severe sepsis with unknown cause, started on empiric antibiotics.  Assessment & Plan:   Principal Problem:   Severe sepsis with acute organ dysfunction (HCC) Active Problems:   AKI (acute kidney injury) ([place])   Essential hypertension   Type II diabetes mellitus with complication (HCC)  Severe sepsis with unknown cause: Patient presented in the ED with hypotension, tachycardia, tachypnea, lactic acidosis 2.9. 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 There is no other symptoms to suggest source at this point other than nausea, vomiting, diarrhea. Procalcitonin normal.  Influenza, COVID, RSV negative. CT abdomen and pelvis showed stool burden, esophagitis. Stool for C. difficile negative, GI panel pending It could be possible that patient had hypotension and lactic acidosis not from sepsis but from dehydration/orthostasis in the setting of nausea vomiting and diarrhea. Started on empiric antibiotics , Vanco /  cefepime /Flagyl. Follow blood cultures, urine cultures Lactic acid improved with IV fluid resuscitation.   Follow-up GI panel.  Procalcitonin is normal  Acute kidney injury: Baseline serum creatinine 0.9 presented with creatinine 1.3 likely  prerenal in the setting of nausea vomiting diarrhea Hold nephrotoxic medications. Continue IV fluid resuscitation. Serum creatinine improving.  Type 2 diabetes: Hold Ozempic for this time as patient has nausea and vomiting. Continue regular insulin sliding scale  Essential hypertension: Blood pressure is on the soft side, Hold home blood pressure medications.  Hypokalemia: Replaced.  Continue to monitor   DVT prophylaxis: Lovenox Code Status: Full code Family Communication: No family at bedside Disposition Plan:   Admitted for severe sepsis due to unknown cause.  Stool for C. difficile is negative.  Likely dehydration.  Follow-up cultures.   Consultants:  None  Procedures: CT abdomen and pelvis Antimicrobials:  Anti-infectives (From admission, onward)    Start     Dose/Rate Route Frequency Ordered Stop   06/21/22 1600  vancomycin (VANCOCIN) IVPB 1000 mg/200 mL premix  Status:  Discontinued        1,000 mg 200 mL/hr over 60 Minutes Intravenous Every 24 hours 06/20/22 1341 06/21/22 0832   06/21/22 1600  vancomycin (VANCOREADY) IVPB 1250 mg/250 mL        1,250 mg 166.7 mL/hr over 90 Minutes Intravenous Every 24 hours 06/21/22 0832     06/21/22 0100  ceFEPIme (MAXIPIME) 2 g in sodium chloride 0.9 % 100 mL IVPB        2 g 200 mL/hr over 30 Minutes Intravenous Every 12 hours 06/20/22 1341     06/20/22 2315  metroNIDAZOLE (FLAGYL) IVPB 500 mg        500 mg 100 mL/hr over 60 Minutes Intravenous Every 12 hours 06/20/22 2219 06/27/22 2314   06/20/22 1330  vancomycin (VANCOCIN) IVPB 1000 mg/200 mL premix  1,000 mg 200 mL/hr over 60 Minutes Intravenous  Once 06/20/22 1211 06/20/22 1741   06/20/22 1215  ceFEPIme (MAXIPIME) 2 g in sodium chloride 0.9 % 100 mL IVPB        2 g 200 mL/hr over 30 Minutes Intravenous  Once 06/20/22 1202 06/20/22 1304   06/20/22 1215  metroNIDAZOLE (FLAGYL) IVPB 500 mg        500 mg 100 mL/hr over 60 Minutes Intravenous  Once 06/20/22 1202  06/20/22 1616   06/20/22 1215  vancomycin (VANCOCIN) IVPB 1000 mg/200 mL premix        1,000 mg 200 mL/hr over 60 Minutes Intravenous  Once 06/20/22 1202 06/20/22 1513       Subjective: Patient was seen and examined at bedside.  Overnight events noted. Patient reports doing better.  Remains afebrile.  She denies any specific symptoms.  Objective: Vitals:   06/20/22 2046 06/21/22 0053 06/21/22 0500 06/21/22 0820  BP: 108/68 (!) 131/41 118/60 107/80  Pulse: 99 95 91 84  Resp: 20   17  Temp: 98.3 F (36.8 C) 97.9 F (36.6 C) 98.3 F (36.8 C)   TempSrc: Oral Axillary Oral   SpO2: 93% 98% 95% 95%  Weight:      Height:        Intake/Output Summary (Last 24 hours) at 06/21/2022 1141 Last data filed at 06/21/2022 [id number] Gross per 24 hour  Intake 5636.4 ml  Output 1000 ml  Net 4636.4 ml   Filed Weights   06/20/22 1111 06/20/22 2039  Weight: 82.6 kg 86.6 kg    Examination:  General exam: Appears comfortable, not in any acute distress, deconditioned. Respiratory system: CTA bilaterally, respiratory effort normal, RR 15. Cardiovascular system: S1 & S2 heard, regular rate and rhythm, no murmur. Gastrointestinal system: Abdomen is soft, nontender, nondistended, BS+ Central nervous system: Alert and oriented x 3. No focal neurological deficits. Extremities: No edema, no cyanosis, no clubbing. Skin: No rashes, lesions or ulcers Psychiatry: Judgement and insight appear normal. Mood & affect appropriate.     Data Reviewed: I have personally reviewed following labs and imaging studies  CBC: Recent Labs  Lab 06/20/22 1113 06/21/22 0051  WBC 11.0* 6.1  NEUTROABS 8.3*  --   HGB 16.2* 12.0  HCT 48.5* 37.2  MCV 93.8 96.6  PLT 272 [id number]   Basic Metabolic Panel: Recent Labs  Lab 06/20/22 1113 06/20/22 1536 06/21/22 0051  NA 143  --  136  K 3.2*  --  2.7*  CL 97*  --  101  CO2 31  --  28  GLUCOSE 132*  --  78  BUN 23  --  18  CREATININE 1.30*  --  1.08*  CALCIUM 11.6*  --   8.9  MG  --  1.4* 1.8   GFR: Estimated Creatinine Clearance: 51.4 mL/min (A) (by C-G formula based on SCr of 1.08 mg/dL (H)). Liver Function Tests: Recent Labs  Lab 06/20/22 1113 06/21/22 0051  AST 20 18  ALT 13 14  ALKPHOS 84 67  BILITOT 0.8 0.8  PROT 7.3 5.1*  ALBUMIN 4.2 2.8*   No results for input(s): "LIPASE", "AMYLASE" in the last 168 hours. No results for input(s): "AMMONIA" in the last 168 hours. Coagulation Profile: Recent Labs  Lab 06/20/22 1113 06/21/22 0051  INR 0.9 1.0   Cardiac Enzymes: No results for input(s): "CKTOTAL", "CKMB", "CKMBINDEX", "TROPONINI" in the last 168 hours. BNP (last 3 results) No results for input(s): "PROBNP" in the last 8760 hours. HbA1C:  No results for input(s): "HGBA1C" in the last 72 hours. CBG: Recent Labs  Lab 06/21/22 0002 06/21/22 0046 06/21/22 0117 06/21/22 0439 06/21/22 0743  GLUCAP 65* 67* 80 97 79   Lipid Profile: No results for input(s): "CHOL", "HDL", "LDLCALC", "TRIG", "CHOLHDL", "LDLDIRECT" in the last 72 hours. Thyroid Function Tests: No results for input(s): "TSH", "T4TOTAL", "FREET4", "T3FREE", "THYROIDAB" in the last 72 hours. Anemia Panel: No results for input(s): "VITAMINB12", "FOLATE", "FERRITIN", "TIBC", "IRON", "RETICCTPCT" in the last 72 hours. Sepsis Labs: Recent Labs  Lab 06/20/22 1113 06/20/22 1343 06/20/22 1536 06/20/22 1730 06/21/22 0051  PROCALCITON  --   --   --   --  <0.10  LATICACIDVEN 2.9* 2.8* 2.6* 1.9  --     Recent Results (from the past 240 hour(s))  Culture, blood (Routine x 2)     Status: None (Preliminary result)   Collection Time: 06/20/22 11:18 AM   Specimen: Right Antecubital; Blood  Result Value Ref Range Status   Specimen Description   Final    RIGHT ANTECUBITAL BLOOD Performed at [facility] [address], [place], [place] [address]    Special Requests   Final    BOTTLES DRAWN AEROBIC AND ANAEROBIC Blood Culture adequate volume Performed at [facility], [address], [place], [place] [address]    Culture   Final    NO GROWTH < 24 HOURS Performed at [facility], [place] [address] [place], [place] [address]    Report Status PENDING  Incomplete  Culture, blood (Routine x 2)     Status: None (Preliminary result)   Collection Time: 06/20/22 12:20 PM   Specimen: BLOOD RIGHT HAND  Result Value Ref Range Status   Specimen Description   Final    BLOOD RIGHT HAND Performed at [facility], [address] [place], [place] [address]    Special Requests   Final    BOTTLES DRAWN AEROBIC AND ANAEROBIC Blood Culture adequate volume Performed at [facility], [address] [place], [place] [address]    Culture   Final    NO GROWTH < 24 HOURS Performed at [facility], [place] [address] [place],  [address]    Report Status PENDING  Incomplete  Resp panel by RT-PCR (RSV, Flu A&B, Covid) Anterior Nasal Swab     Status: None   Collection Time: 06/20/22  1:43 PM   Specimen: Anterior Nasal Swab  Result Value Ref Range Status   SARS Coronavirus 2 by RT PCR NEGATIVE NEGATIVE Final    Comment: (NOTE) SARS-CoV-2 target nucleic acids are NOT DETECTED.  The SARS-CoV-2 RNA is generally detectable in upper respiratory specimens during the acute phase of infection. The lowest concentration of SARS-CoV-2 viral copies this assay can detect is 138 copies/mL. A negative result does not preclude SARS-Cov-2 infection and should not be used as the sole basis for treatment or other patient management decisions. A negative result may occur with  improper specimen collection/handling, submission of specimen other than nasopharyngeal swab, presence of viral mutation(s) within the areas targeted by this assay, and inadequate number of viral copies(<138 copies/mL). A negative result must be combined with clinical observations, patient history, and epidemiological information. The  expected result is Negative.  Fact Sheet for Patients:  EntrepreneurPulse.com.au  Fact Sheet for Healthcare Providers:  IncredibleEmployment.be  This test is no t yet approved or cleared by the [place] FDA and  has been authorized for detection and/or diagnosis of  SARS-CoV-2 by FDA under an Emergency Use Authorization (EUA). This EUA will remain  in effect (meaning this test can be used) for the duration of the COVID-19 declaration under Section 564(b)(1) of the Act, 21 U.S.C.section 360bbb-3(b)(1), unless the authorization is terminated  or revoked sooner.       Influenza A by PCR NEGATIVE NEGATIVE Final   Influenza B by PCR NEGATIVE NEGATIVE Final    Comment: (NOTE) The Xpert Xpress SARS-CoV-2/FLU/RSV plus assay is intended as an aid in the diagnosis of influenza from Nasopharyngeal swab specimens and should not be used as a sole basis for treatment. Nasal washings and aspirates are unacceptable for Xpert Xpress SARS-CoV-2/FLU/RSV testing.  Fact Sheet for Patients: EntrepreneurPulse.com.au  Fact Sheet for Healthcare Providers: IncredibleEmployment.be  This test is not yet approved or cleared by the [place] FDA and has been authorized for detection and/or diagnosis of SARS-CoV-2 by FDA under an Emergency Use Authorization (EUA). This EUA will remain in effect (meaning this test can be used) for the duration of the COVID-19 declaration under Section 564(b)(1) of the Act, 21 U.S.C. section 360bbb-3(b)(1), unless the authorization is terminated or revoked.     Resp Syncytial Virus by PCR NEGATIVE NEGATIVE Final    Comment: (NOTE) Fact Sheet for Patients: EntrepreneurPulse.com.au  Fact Sheet for Healthcare Providers: IncredibleEmployment.be  This test is not yet approved or cleared by the [place] FDA and has been authorized for detection and/or  diagnosis of SARS-CoV-2 by FDA under an Emergency Use Authorization (EUA). This EUA will remain in effect (meaning this test can be used) for the duration of the COVID-19 declaration under Section 564(b)(1) of the Act, 21 U.S.C. section 360bbb-3(b)(1), unless the authorization is terminated or revoked.  Performed at [facility], [place], [place] [address]   C Difficile Quick Screen w PCR reflex     Status: None   Collection Time: 06/21/22  9:09 AM  Result Value Ref Range Status   C Diff antigen NEGATIVE NEGATIVE Final   C Diff toxin NEGATIVE NEGATIVE Final   C Diff interpretation No C. difficile detected.  Final    Comment: Performed at [facility], [place] [address], [place], [place] [address]    Radiology Studies: CT ABDOMEN PELVIS W CONTRAST  Result Date: 06/20/2022 CLINICAL DATA:  Bowel obstruction suspected N/V for 2 weeks, hx of lumbar osteomyelitis EXAM: CT ABDOMEN AND PELVIS WITH CONTRAST TECHNIQUE: Multidetector CT imaging of the abdomen and pelvis was performed using the standard protocol following bolus administration of intravenous contrast. RADIATION DOSE REDUCTION: This exam was performed according to the departmental dose-optimization program which includes automated exposure control, adjustment of the mA and/or kV according to patient size and/or use of iterative reconstruction technique. CONTRAST:  30m OMNIPAQUE IOHEXOL 350 MG/ML SOLN COMPARISON:  CT 07/09/2019 FINDINGS: Lower chest: Lingular scarring/subsegmental atelectasis. Unchanged subendocardial hypoattenuation/fat density in the left ventricular septum and in the right ventricle. Hepatobiliary: No focal liver abnormality is seen. The gallbladder is unremarkable. Pancreas: Unremarkable. No pancreatic ductal dilatation or surrounding inflammatory changes. Spleen: Normal in size without focal abnormality. Adrenals/Urinary Tract: Adrenal glands are unremarkable. No  hydronephrosis or nephroureterolithiasis. Small bilateral renal cysts which appear simple require no follow-up imaging. Bladder is minimally distended. Stomach/Bowel: There is distal esophageal wall thickening. The stomach is within normal limits. There is no evidence of bowel obstruction.Prior appendectomy. Moderate to large rectal stool burden. Sigmoid diverticulosis. No acute diverticulitis. Vascular/Lymphatic: Aortoiliac atherosclerosis. No AAA. No lymphadenopathy. Reproductive: Prior hysterectomy. Other:  No abdominal wall hernia or abnormality. No abdominopelvic ascites. Musculoskeletal: No acute osseous abnormality. No suspicious osseous lesion. There is a chronic compression deformity of T12 without evidence of progressive height loss. Prior fusion from L1-S1 with unchanged mild residual retrolisthesis at L1-L2. There is mild lucency along the L1 pedicle screws. Hardware is intact. IMPRESSION: Distal esophageal wall thickening, may suggest esophagitis. Moderate to large rectal stool burden. No bowel obstruction or other acute findings in the abdomen or pelvis. Chronic compression deformity of T12 without evidence of progressive height loss. Prior fusion from L1-S1 with unchanged mild residual retrolisthesis at L1-L2. Mild lucency along the L1 pedicle screws. Electronically Signed   By: Maurine Simmering M.D.   On: 06/20/2022 13:52   DG Chest Port 1 View  Result Date: 06/20/2022 CLINICAL DATA:  Vomiting for 2 weeks. EXAM: PORTABLE CHEST 1 VIEW COMPARISON:  CT chest and chest x-ray dated January 18, 2021. FINDINGS: New loop recorder noted. Heart remains at the upper limits of normal in size. Normal pulmonary vascularity. Chronic emphysematous changes and mildly coarsened interstitial markings are similar. No focal consolidation, pleural effusion, or pneumothorax. No acute osseous abnormality. IMPRESSION: 1. No active disease. 2. COPD. Electronically Signed   By: Titus Dubin M.D.   On: 06/20/2022 12:49      Scheduled Meds:  amitriptyline  100 mg Oral QHS   enoxaparin (LOVENOX) injection  40 mg Subcutaneous Q24H   insulin aspart  0-9 Units Subcutaneous Q4H   potassium chloride  40 mEq Oral Once   Continuous Infusions:  sodium chloride Stopped (06/20/22 1800)   ceFEPime (MAXIPIME) IV Stopped (06/21/22 0143)   metronidazole 500 mg (06/21/22 1041)   vancomycin       LOS: 1 day    Time spent: 50 mins    Duard Brady, MD [facility]   If 7PM-7AM, please contact night-coverage

## 2022-06-21 NOTE — Progress Notes (Signed)
PHARMACY NOTE:  ANTIMICROBIAL RENAL DOSAGE ADJUSTMENT  Current antimicrobial regimen includes a mismatch between antimicrobial dosage and estimated renal function.  As per policy approved by the Pharmacy & Therapeutics and Medical Executive Committees, the antimicrobial dosage will be adjusted accordingly.  Current antimicrobial dosage:  vancomycin 1000 mg IV q24h  Indication: sepsis  Renal Function:  Estimated Creatinine Clearance: 51.4 mL/min (A) (by C-G formula based on SCr of 1.08 mg/dL (H)).     Antimicrobial dosage has been changed to:  vancomycin 1250 mg IV q24h    Thank you for allowing pharmacy to be a part of this patient's care.   Tawnya Crook, PharmD, BCPS Clinical Pharmacist 06/21/2022 8:43 AM

## 2022-06-22 DIAGNOSIS — A419 Sepsis, unspecified organism: Secondary | ICD-10-CM | POA: Diagnosis not present

## 2022-06-22 DIAGNOSIS — R6520 Severe sepsis without septic shock: Secondary | ICD-10-CM | POA: Diagnosis not present

## 2022-06-22 LAB — GLUCOSE, CAPILLARY
Glucose-Capillary: 78 mg/dL (ref 70–99)
Glucose-Capillary: 75 mg/dL (ref 70–99)
Glucose-Capillary: 78 mg/dL (ref 70–99)
Glucose-Capillary: 57 mg/dL — ABNORMAL LOW (ref 70–99)
Glucose-Capillary: 59 mg/dL — ABNORMAL LOW (ref 70–99)
Glucose-Capillary: 51 mg/dL — ABNORMAL LOW (ref 70–99)
Glucose-Capillary: 59 mg/dL — ABNORMAL LOW (ref 70–99)
Glucose-Capillary: 116 mg/dL — ABNORMAL HIGH (ref 70–99)
Glucose-Capillary: 77 mg/dL (ref 70–99)

## 2022-06-22 LAB — GASTROINTESTINAL PANEL BY PCR, STOOL (REPLACES STOOL CULTURE)

## 2022-06-22 LAB — CBC
HCT: 35.6 % — ABNORMAL LOW (ref 36.0–46.0)
Hemoglobin: 11.2 g/dL — ABNORMAL LOW (ref 12.0–15.0)
MCH: 30.8 pg (ref 26.0–34.0)
MCHC: 31.5 g/dL (ref 30.0–36.0)
MCV: 97.8 fL (ref 80.0–100.0)
Platelets: 140 10*3/uL — ABNORMAL LOW (ref 150–400)
RBC: 3.64 MIL/uL — ABNORMAL LOW (ref 3.87–5.11)
RDW: 14.6 % (ref 11.5–15.5)
WBC: 5 10*3/uL (ref 4.0–10.5)
nRBC: 0 % (ref 0.0–0.2)

## 2022-06-22 LAB — BASIC METABOLIC PANEL
Anion gap: 4 — ABNORMAL LOW (ref 5–15)
BUN: 12 mg/dL (ref 8–23)
CO2: 25 mmol/L (ref 22–32)
Calcium: 9 mg/dL (ref 8.9–10.3)
Chloride: 112 mmol/L — ABNORMAL HIGH (ref 98–111)
Creatinine, Ser: 0.87 mg/dL (ref 0.44–1.00)
GFR, Estimated: >60 mL/min (ref >60)
Glucose, Bld: 83 mg/dL (ref 70–99)
Potassium: 3.8 mmol/L (ref 3.5–5.1)
Sodium: 141 mmol/L (ref 135–145)

## 2022-06-22 LAB — PHOSPHORUS: Phosphorus: 2.8 mg/dL (ref 2.5–4.6)

## 2022-06-22 LAB — MAGNESIUM: Magnesium: 1.7 mg/dL (ref 1.7–2.4)

## 2022-06-22 MED ORDER — HYDROCODONE-ACETAMINOPHEN 5-325 MG PO TABS
1.0000 | ORAL_TABLET | Freq: Once | ORAL | Status: AC | PRN
  Administered 2022-06-22: 1 via ORAL
  Filled 2022-06-22: qty 1

## 2022-06-22 MED ORDER — MAGNESIUM SULFATE 2 GM/50ML IV SOLN
2.0000 g | Freq: Once | INTRAVENOUS | Status: AC
  Administered 2022-06-22: 2 g via INTRAVENOUS
  Filled 2022-06-22: qty 50

## 2022-06-22 MED ORDER — DEXTROSE IN LACTATED RINGERS 5 % IV SOLN
INTRAVENOUS | Status: DC
  Filled 2022-06-22: qty 1000

## 2022-06-22 MED ORDER — ONDANSETRON HCL 4 MG/2ML IJ SOLN
4.0000 mg | Freq: Four times a day (QID) | INTRAMUSCULAR | Status: DC | PRN
  Administered 2022-06-22 – 2022-06-24 (×3): 4 mg via INTRAVENOUS
  Filled 2022-06-22 (×3): qty 2

## 2022-06-22 MED ORDER — DIPHENHYDRAMINE HCL 25 MG PO CAPS
25.0000 mg | ORAL_CAPSULE | Freq: Three times a day (TID) | ORAL | Status: DC | PRN
  Administered 2022-06-22 – 2022-06-28 (×4): 25 mg via ORAL
  Filled 2022-06-22 (×4): qty 1

## 2022-06-22 MED ORDER — PANTOPRAZOLE SODIUM 40 MG IV SOLR
40.0000 mg | Freq: Two times a day (BID) | INTRAVENOUS | Status: DC
  Administered 2022-06-22 – 2022-06-26 (×9): 40 mg via INTRAVENOUS
  Filled 2022-06-22 (×9): qty 10

## 2022-06-22 MED ORDER — DEXTROSE 50 % IV SOLN
INTRAVENOUS | Status: AC
  Administered 2022-06-22: 25 mL
  Filled 2022-06-22: qty 50

## 2022-06-22 MED ORDER — PANTOPRAZOLE SODIUM 40 MG PO TBEC: 40.0000 mg | DELAYED_RELEASE_TABLET | Freq: Every day | ORAL | Status: DC

## 2022-06-22 MED ORDER — LACTATED RINGERS IV BOLUS
1000.0000 mL | Freq: Once | INTRAVENOUS | Status: AC
  Administered 2022-06-22: 1000 mL via INTRAVENOUS

## 2022-06-22 MED ORDER — COSYNTROPIN 0.25 MG IJ SOLR
0.2500 mg | Freq: Once | INTRAMUSCULAR | Status: DC
  Filled 2022-06-22: qty 0.25

## 2022-06-22 MED ORDER — CEFADROXIL 500 MG PO CAPS
500.0000 mg | ORAL_CAPSULE | Freq: Two times a day (BID) | ORAL | Status: DC
  Administered 2022-06-22 – 2022-06-29 (×14): 500 mg via ORAL
  Filled 2022-06-22 (×17): qty 1

## 2022-06-22 MED ORDER — LACTATED RINGERS IV SOLN: INTRAVENOUS | Status: DC

## 2022-06-22 NOTE — Progress Notes (Addendum)
Pushed half an amp of d5 for a CBG reading of 51. Rechecked after half of amp for a CBG reading of 116. MD made aware

## 2022-06-22 NOTE — Progress Notes (Signed)
PT Cancellation Note  Patient Details Name: Brandi Stephens MRN: [id number] DOB: February 04, 1946   Cancelled Treatment:    Reason Eval/Treat Not Completed: Other (comment) Pt with orthostatic hypotension and not feeling well.  Not able to participate with therapy.  Will f/u at later time/date as able. Abran Richard, PT Acute Rehab [facility] [phone]   Karlton Lemon 06/22/2022, 12:18 PM

## 2022-06-22 NOTE — Progress Notes (Signed)
PROGRESS NOTE    Brandi Stephens  [id number] DOB: 01/19/1946 DOA: 06/20/2022 PCP: Carlena Hurl, PA-C   Brief Narrative: 77 year old with past medical history significant for diabetes type 2, hypertension, chronic osteomyelitis of lumbar spine with MSSA on suppressive antibiotics.  She has lost about 100 pounds in the last year as she was a started on Ozempic.  She presented to the ED with 2 weeks history of nausea, vomiting and diarrhea.  She also reports some abdominal discomfort.  She was found to have lactic acidosis, lactic acid up to 2.9.  She was hypotensive, improved with IV fluids.  He was admitted for severe sepsis. GI Pathogen positive for norovirus.  Assessment & Plan:   Principal Problem:   Severe sepsis with acute organ dysfunction (HCC) Active Problems:   AKI (acute kidney injury) ([place])   Essential hypertension   Type II diabetes mellitus with complication (HCC)  1-Severe Sepsis, could be related to norovirus infection -Presented with hypotension, tachycardia, tachypnea, lactic acidosis. 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  -CT abdomen and pelvis: Shows stool burden, esophagitis -C. difficile negative, GI pathogen positive for norovirus -Was treated with IV antibiotics, which has been discontinued by ID -Blood cultures: No growth to date,   2-Acute kidney injury: Creatinine baseline 0.9 presented with a creatinine of 1.3 in the setting of hypovolemia.   Continue with IV fluids  3-Nausea, vomiting -Suspect related to norovirus -Start IV Protonix, as needed Zofran  4-Orthostatic Hypotension, recurrent Syncope: IV fluids, continue with Plan for IV Fluids bolus. This her level was low at 5.  Plan to proceed with consult troponin test On 30 day monitor by PCP/   Hypokalemia; Replaced.   Hypomagnesemia;  Chronic diastolic heart failure: Continue to hold Bumex  Estimated  body mass index is 29.47 kg/m as calculated from the following:   Height as of this encounter: 5' 7.5" (1.715 m).   Weight as of this encounter: 86.6 kg.   DVT prophylaxis: Lovenox Code Status: Full code Family Communication: Care discussed with patient.  Disposition Plan:  Status is: Inpatient Remains inpatient appropriate because: management of nausea, dehydration.     Consultants:  ID  Procedures:  None  Antimicrobials:    Subjective: She is not feeling well. She report nausea.  She report episodes of pressure in forehead, feeling of warm , hot in her ears for last 2 weeks. On and off. She had CT scan other hospital which was negative. Denies vision changes.  She has had dizziness as well.    Objective: Vitals:   06/22/22 1101 06/22/22 1105 06/22/22 1107 06/22/22 1214  BP: 100/72 92/69 (!) 74/59 115/68  Pulse: 64 85 96 77  Resp: 20     Temp: 97.9 F (36.6 C)   97.9 F (36.6 C)  TempSrc:    Oral  SpO2: 96% 94% 96% 96%  Weight:      Height:        Intake/Output Summary (Last 24 hours) at 06/22/2022 1226 Last data filed at 06/22/2022 0900 Gross per 24 hour  Intake 1520 ml  Output 1650 ml  Net -130 ml   Filed Weights   06/20/22 1111 06/20/22 2039  Weight: 82.6 kg 86.6 kg    Examination:  General exam: Appears calm and comfortable  Respiratory system: Clear to auscultation. Respiratory effort normal. Cardiovascular system: S1 & S2 heard, RRR. No JVD, murmurs, rubs, gallops or clicks.  No pedal edema. Gastrointestinal system: Abdomen is nondistended, soft and nontender. No organomegaly or masses felt. Normal bowel sounds heard. Central nervous system: Alert and oriented. No focal neurological deficits. Extremities: Symmetric 5 x 5 power.    Data Reviewed: I have personally reviewed following labs and imaging studies  CBC: Recent Labs  Lab 06/20/22 1113 06/21/22 0051 06/22/22 0434  WBC 11.0* 6.1 5.0  NEUTROABS 8.3*  --   --   HGB 16.2* 12.0 11.2*   HCT 48.5* 37.2 35.6*  MCV 93.8 96.6 97.8  PLT 272 169 [id number]*   Basic Metabolic Panel: Recent Labs  Lab 06/20/22 1113 06/20/22 1536 06/21/22 0051 06/22/22 0434  NA 143  --  136 141  K 3.2*  --  2.7* 3.8  CL 97*  --  101 112*  CO2 31  --  28 25  GLUCOSE 132*  --  78 83  BUN 23  --  18 12  CREATININE 1.30*  --  1.08* 0.87  CALCIUM 11.6*  --  8.9 9.0  MG  --  1.4* 1.8 1.7  PHOS  --   --   --  2.8   GFR: Estimated Creatinine Clearance: 63.8 mL/min (by C-G formula based on SCr of 0.87 mg/dL). Liver Function Tests: Recent Labs  Lab 06/20/22 1113 06/21/22 0051  AST 20 18  ALT 13 14  ALKPHOS 84 67  BILITOT 0.8 0.8  PROT 7.3 5.1*  ALBUMIN 4.2 2.8*   No results for input(s): "LIPASE", "AMYLASE" in the last 168 hours. No results for input(s): "AMMONIA" in the last 168 hours. Coagulation Profile: Recent Labs  Lab 06/20/22 1113 06/21/22 0051  INR 0.9 1.0   Cardiac Enzymes: No results for input(s): "CKTOTAL", "CKMB", "CKMBINDEX", "TROPONINI" in the last 168 hours. BNP (last 3 results) No results for input(s): "PROBNP" in the last 8760 hours. HbA1C: No results for input(s): "HGBA1C" in the last 72 hours. CBG: Recent Labs  Lab 06/21/22 2003 06/21/22 2327 06/22/22 0432 06/22/22 0738 06/22/22 1151  GLUCAP 73 82 78 75 78   Lipid Profile: No results for input(s): "CHOL", "HDL", "LDLCALC", "TRIG", "CHOLHDL", "LDLDIRECT" in the last 72 hours. Thyroid Function Tests: No results for input(s): "TSH", "T4TOTAL", "FREET4", "T3FREE", "THYROIDAB" in the last 72 hours. Anemia Panel: No results for input(s): "VITAMINB12", "FOLATE", "FERRITIN", "TIBC", "IRON", "RETICCTPCT" in the last 72 hours. Sepsis Labs: Recent Labs  Lab 06/20/22 1113 06/20/22 1343 06/20/22 1536 06/20/22 1730 06/21/22 0051  PROCALCITON  --   --   --   --  <0.10  LATICACIDVEN 2.9* 2.8* 2.6* 1.9  --     Recent Results (from the past 240 hour(s))  Culture, blood (Routine x 2)     Status: None  (Preliminary result)   Collection Time: 06/20/22 11:18 AM   Specimen: Right Antecubital; Blood  Result Value Ref Range Status   Specimen Description   Final    RIGHT ANTECUBITAL BLOOD Performed at [facility] [address] [place], [place] [address]    Special Requests   Final    BOTTLES DRAWN AEROBIC AND ANAEROBIC Blood Culture adequate volume Performed at [facility], [address], [place], [place] [address]    Culture   Final    NO GROWTH 2 DAYS Performed at [facility], [place] [address] [place], [place] [address]    Report Status PENDING  Incomplete  Culture, blood (Routine x 2)     Status: None (Preliminary result)   Collection Time: 06/20/22  12:20 PM   Specimen: BLOOD RIGHT HAND  Result Value Ref Range Status   Specimen Description   Final    BLOOD RIGHT HAND Performed at [facility], [address], [place], [place] [address]    Special Requests   Final    BOTTLES DRAWN AEROBIC AND ANAEROBIC Blood Culture adequate volume Performed at [facility], [address], [place], [place] [address]    Culture   Final    NO GROWTH 2 DAYS Performed at [facility], [place] [address] [place], [place] [address]    Report Status PENDING  Incomplete  Resp panel by RT-PCR (RSV, Flu A&B, Covid) Anterior Nasal Swab     Status: None   Collection Time: 06/20/22  1:43 PM   Specimen: Anterior Nasal Swab  Result Value Ref Range Status   SARS Coronavirus 2 by RT PCR NEGATIVE NEGATIVE Final    Comment: (NOTE) SARS-CoV-2 target nucleic acids are NOT DETECTED.  The SARS-CoV-2 RNA is generally detectable in upper respiratory specimens during the acute phase of infection. The lowest concentration of SARS-CoV-2 viral copies this assay can detect is 138 copies/mL. A negative result does not preclude SARS-Cov-2 infection and should not be used as the sole basis for treatment or other patient management  decisions. A negative result may occur with  improper specimen collection/handling, submission of specimen other than nasopharyngeal swab, presence of viral mutation(s) within the areas targeted by this assay, and inadequate number of viral copies(<138 copies/mL). A negative result must be combined with clinical observations, patient history, and epidemiological information. The expected result is Negative.  Fact Sheet for Patients:  EntrepreneurPulse.com.au  Fact Sheet for Healthcare Providers:  IncredibleEmployment.be  This test is no t yet approved or cleared by the [place] FDA and  has been authorized for detection and/or diagnosis of SARS-CoV-2 by FDA under an Emergency Use Authorization (EUA). This EUA will remain  in effect (meaning this test can be used) for the duration of the COVID-19 declaration under Section 564(b)(1) of the Act, 21 U.S.C.section 360bbb-3(b)(1), unless the authorization is terminated  or revoked sooner.       Influenza A by PCR NEGATIVE NEGATIVE Final   Influenza B by PCR NEGATIVE NEGATIVE Final    Comment: (NOTE) The Xpert Xpress SARS-CoV-2/FLU/RSV plus assay is intended as an aid in the diagnosis of influenza from Nasopharyngeal swab specimens and should not be used as a sole basis for treatment. Nasal washings and aspirates are unacceptable for Xpert Xpress SARS-CoV-2/FLU/RSV testing.  Fact Sheet for Patients: EntrepreneurPulse.com.au  Fact Sheet for Healthcare Providers: IncredibleEmployment.be  This test is not yet approved or cleared by the [place] FDA and has been authorized for detection and/or diagnosis of SARS-CoV-2 by FDA under an Emergency Use Authorization (EUA). This EUA will remain in effect (meaning this test can be used) for the duration of the COVID-19 declaration under Section 564(b)(1) of the Act, 21 U.S.C. section 360bbb-3(b)(1), unless the  authorization is terminated or revoked.     Resp Syncytial Virus by PCR NEGATIVE NEGATIVE Final    Comment: (NOTE) Fact Sheet for Patients: EntrepreneurPulse.com.au  Fact Sheet for Healthcare Providers: IncredibleEmployment.be  This test is not yet approved or cleared by the [place] FDA and has been authorized for detection and/or diagnosis of SARS-CoV-2 by FDA under an Emergency Use Authorization (EUA). This EUA will remain in effect (meaning this test can be used) for the duration of the COVID-19 declaration under Section 564(b)(1) of the  Act, 21 U.S.C. section 360bbb-3(b)(1), unless the authorization is terminated or revoked.  Performed at [facility], [place], [place] [address]   C Difficile Quick Screen w PCR reflex     Status: None   Collection Time: 06/21/22  9:09 AM  Result Value Ref Range Status   C Diff antigen NEGATIVE NEGATIVE Final   C Diff toxin NEGATIVE NEGATIVE Final   C Diff interpretation No C. difficile detected.  Final    Comment: Performed at [facility], [place] [address], [place], [place] [address]  Gastrointestinal Panel by PCR , Stool     Status: Abnormal   Collection Time: 06/21/22  9:09 AM  Result Value Ref Range Status   Campylobacter species NOT DETECTED NOT DETECTED Final   Plesimonas shigelloides NOT DETECTED NOT DETECTED Final   Salmonella species NOT DETECTED NOT DETECTED Final   Yersinia enterocolitica NOT DETECTED NOT DETECTED Final   Vibrio species NOT DETECTED NOT DETECTED Final   Vibrio cholerae NOT DETECTED NOT DETECTED Final   Enteroaggregative E coli (EAEC) NOT DETECTED NOT DETECTED Final   Enteropathogenic E coli (EPEC) NOT DETECTED NOT DETECTED Final   Enterotoxigenic E coli (ETEC) NOT DETECTED NOT DETECTED Final   Shiga like toxin producing E coli (STEC) NOT DETECTED NOT DETECTED Final   Shigella/Enteroinvasive E coli (EIEC) NOT DETECTED  NOT DETECTED Final   Cryptosporidium NOT DETECTED NOT DETECTED Final   Cyclospora cayetanensis NOT DETECTED NOT DETECTED Final   Entamoeba histolytica NOT DETECTED NOT DETECTED Final   Giardia lamblia NOT DETECTED NOT DETECTED Final   Adenovirus F40/41 NOT DETECTED NOT DETECTED Final   Astrovirus NOT DETECTED NOT DETECTED Final   Norovirus GI/GII DETECTED (A) NOT DETECTED Final    Comment: RESULT CALLED TO, READ BACK BY AND VERIFIED WITH: Margarita Mail RN '@[id number]'$  06/22/22 ASW    Rotavirus A NOT DETECTED NOT DETECTED Final   Sapovirus (I, II, IV, and V) NOT DETECTED NOT DETECTED Final    Comment: Performed at [facility], [address] [place], [place] [address]         Radiology Studies: CT ABDOMEN PELVIS W CONTRAST  Result Date: 06/20/2022 CLINICAL DATA:  Bowel obstruction suspected N/V for 2 weeks, hx of lumbar osteomyelitis EXAM: CT ABDOMEN AND PELVIS WITH CONTRAST TECHNIQUE: Multidetector CT imaging of the abdomen and pelvis was performed using the standard protocol following bolus administration of intravenous contrast. RADIATION DOSE REDUCTION: This exam was performed according to the departmental dose-optimization program which includes automated exposure control, adjustment of the mA and/or kV according to patient size and/or use of iterative reconstruction technique. CONTRAST:  13m OMNIPAQUE IOHEXOL 350 MG/ML SOLN COMPARISON:  CT 07/09/2019 FINDINGS: Lower chest: Lingular scarring/subsegmental atelectasis. Unchanged subendocardial hypoattenuation/fat density in the left ventricular septum and in the right ventricle. Hepatobiliary: No focal liver abnormality is seen. The gallbladder is unremarkable. Pancreas: Unremarkable. No pancreatic ductal dilatation or surrounding inflammatory changes. Spleen: Normal in size without focal abnormality. Adrenals/Urinary Tract: Adrenal glands are unremarkable. No hydronephrosis or nephroureterolithiasis. Small bilateral renal cysts which appear  simple require no follow-up imaging. Bladder is minimally distended. Stomach/Bowel: There is distal esophageal wall thickening. The stomach is within normal limits. There is no evidence of bowel obstruction.Prior appendectomy. Moderate to large rectal stool burden. Sigmoid diverticulosis. No acute diverticulitis. Vascular/Lymphatic: Aortoiliac atherosclerosis. No AAA. No lymphadenopathy. Reproductive: Prior hysterectomy. Other: No abdominal wall hernia or abnormality. No abdominopelvic ascites. Musculoskeletal: No acute osseous abnormality. No suspicious osseous lesion. There  is a chronic compression deformity of T12 without evidence of progressive height loss. Prior fusion from L1-S1 with unchanged mild residual retrolisthesis at L1-L2. There is mild lucency along the L1 pedicle screws. Hardware is intact. IMPRESSION: Distal esophageal wall thickening, may suggest esophagitis. Moderate to large rectal stool burden. No bowel obstruction or other acute findings in the abdomen or pelvis. Chronic compression deformity of T12 without evidence of progressive height loss. Prior fusion from L1-S1 with unchanged mild residual retrolisthesis at L1-L2. Mild lucency along the L1 pedicle screws. Electronically Signed   By: Maurine Simmering M.D.   On: 06/20/2022 13:52   DG Chest Port 1 View  Result Date: 06/20/2022 CLINICAL DATA:  Vomiting for 2 weeks. EXAM: PORTABLE CHEST 1 VIEW COMPARISON:  CT chest and chest x-ray dated January 18, 2021. FINDINGS: New loop recorder noted. Heart remains at the upper limits of normal in size. Normal pulmonary vascularity. Chronic emphysematous changes and mildly coarsened interstitial markings are similar. No focal consolidation, pleural effusion, or pneumothorax. No acute osseous abnormality. IMPRESSION: 1. No active disease. 2. COPD. Electronically Signed   By: Titus Dubin M.D.   On: 06/20/2022 12:49        Scheduled Meds:  allopurinol  300 mg Oral Daily   amitriptyline  100 mg Oral  QHS   atorvastatin  80 mg Oral Daily   cefadroxil  500 mg Oral BID   DULoxetine  60 mg Oral Daily   enoxaparin (LOVENOX) injection  40 mg Subcutaneous Q24H   insulin aspart  0-9 Units Subcutaneous Q4H   levothyroxine  75 mcg Oral Q0600   metoprolol tartrate  12.5 mg Oral BID   pantoprazole  40 mg Oral Daily   primidone  50 mg Oral BID   topiramate  50 mg Oral QHS   Continuous Infusions:  sodium chloride Stopped (06/20/22 1800)     LOS: 2 days    Time spent: 35 minutes    Jhoselin Crume A Krysia Zahradnik, MD [facility]   If 7PM-7AM, please contact night-coverage [email or web address]  06/22/2022, 12:26 PM

## 2022-06-22 NOTE — Progress Notes (Signed)
[facility] for Infectious Disease    Date of Admission:  06/20/2022   Total days of antibiotics 2   ID: Brandi Stephens is a 77 y.o. female with   Principal Problem:   Severe sepsis with acute organ dysfunction ([place]) Active Problems:   Essential hypertension   AKI (acute kidney injury) ([place])   Type II diabetes mellitus with complication (HCC)    Subjective: Feeling better, no diarrhea but still has ongoing nausea. Her main complaint though is mostly dizziness.  Medications:   allopurinol  300 mg Oral Daily   amitriptyline  100 mg Oral QHS   atorvastatin  80 mg Oral Daily   cefadroxil  500 mg Oral BID   [START ON 06/23/2022] cosyntropin  0.25 mg Intravenous Once   DULoxetine  60 mg Oral Daily   enoxaparin (LOVENOX) injection  40 mg Subcutaneous Q24H   insulin aspart  0-9 Units Subcutaneous Q4H   levothyroxine  75 mcg Oral Q0600   metoprolol tartrate  12.5 mg Oral BID   pantoprazole (PROTONIX) IV  40 mg Intravenous Q12H   primidone  50 mg Oral BID   topiramate  50 mg Oral QHS    Objective: Vital signs in last 24 hours: Temp:  [97.6 F (36.4 C)-98.2 F (36.8 C)] 97.9 F (36.6 C) (03/06 1214) Pulse Rate:  [64-96] 77 (03/06 1214) Resp:  [20] 20 (03/06 1101) BP: (74-145)/(59-88) 115/68 (03/06 1214) SpO2:  [93 %-96 %] 96 % (03/06 1214) Physical Exam  Constitutional:  oriented to person, place, and time. appears well-developed and well-nourished. No distress.  HENT: [place]/AT, PERRLA, no scleral icterus Mouth/Throat: Oropharynx is clear and moist. No oropharyngeal exudate.  Cardiovascular: Normal rate, regular rhythm and normal heart sounds. Exam reveals no gallop and no friction rub.  No murmur heard.  Pulmonary/Chest: Effort normal and breath sounds normal. No respiratory distress.  has no wheezes.  Neck = supple, no nuchal rigidity Abdominal: Soft. Bowel sounds are normal.  exhibits no distension. There is no tenderness.  Lymphadenopathy: no cervical adenopathy. No  axillary adenopathy Neurological: alert and oriented to person, place, and time.  Skin: Skin is warm and dry. No rash noted. No erythema.  Psychiatric: a normal mood and affect.  behavior is normal.    Lab Results Recent Labs    06/21/22 0051 06/22/22 0434  WBC 6.1 5.0  HGB 12.0 11.2*  HCT 37.2 35.6*  NA 136 141  K 2.7* 3.8  CL 101 112*  CO2 28 25  BUN 18 12  CREATININE 1.08* 0.87   Liver Panel Recent Labs    06/20/22 1113 06/21/22 0051  PROT 7.3 5.1*  ALBUMIN 4.2 2.8*  AST 20 18  ALT 13 14  ALKPHOS 84 67  BILITOT 0.8 0.8   Sedimentation Rate No results for input(s): "ESRSEDRATE" in the last 72 hours. C-Reactive Protein No results for input(s): "CRP" in the last 72 hours.  Microbiology: reviewed Studies/Results: No results found.   Assessment/Plan: Norovirus diarrheal infection = likely cause of her admission with dehydration.will stop her broad spectrum abtx  Dizziness = unclear if it postural hypotension. Would recommend orthostatic vitals. Though she reports this ongoing for several weeks.  C/o frequent falls = recommend repeat ct lumbar spine w on contrast to see if any changes to remote HW infection. Unclear if related to dizziness vs. Neurologic weakness.  Remote MSSE lumbar infection = continue on chronic suppression with cefadroxil '500mg'$  po bid.  [facility] for Infectious Diseases  Pager: [phone]  06/22/2022, 2:45 PM

## 2022-06-23 ENCOUNTER — Inpatient Hospital Stay (HOSPITAL_COMMUNITY): Payer: Medicare HMO

## 2022-06-23 DIAGNOSIS — A419 Sepsis, unspecified organism: Secondary | ICD-10-CM | POA: Diagnosis not present

## 2022-06-23 DIAGNOSIS — R6520 Severe sepsis without septic shock: Secondary | ICD-10-CM | POA: Diagnosis not present

## 2022-06-23 LAB — CBC
HCT: 39.4 % (ref 36.0–46.0)
Hemoglobin: 12 g/dL (ref 12.0–15.0)
MCH: 31.5 pg (ref 26.0–34.0)
MCHC: 30.5 g/dL (ref 30.0–36.0)
MCV: 103.4 fL — ABNORMAL HIGH (ref 80.0–100.0)
Platelets: 144 10*3/uL — ABNORMAL LOW (ref 150–400)
RBC: 3.81 MIL/uL — ABNORMAL LOW (ref 3.87–5.11)
RDW: 14.7 % (ref 11.5–15.5)
WBC: 4.4 10*3/uL (ref 4.0–10.5)
nRBC: 0 % (ref 0.0–0.2)

## 2022-06-23 LAB — ACTH STIMULATION, 3 TIME POINTS
Cortisol, 30 Min: 19.3 ug/dL
Cortisol, 60 Min: 21 ug/dL
Cortisol, Base: 4.7 ug/dL

## 2022-06-23 LAB — BASIC METABOLIC PANEL
Anion gap: 6 (ref 5–15)
BUN: 10 mg/dL (ref 8–23)
CO2: 22 mmol/L (ref 22–32)
Calcium: 8.9 mg/dL (ref 8.9–10.3)
Chloride: 110 mmol/L (ref 98–111)
Creatinine, Ser: 0.93 mg/dL (ref 0.44–1.00)
GFR, Estimated: >60 mL/min (ref >60)
Glucose, Bld: 77 mg/dL (ref 70–99)
Potassium: 3.9 mmol/L (ref 3.5–5.1)
Sodium: 138 mmol/L (ref 135–145)

## 2022-06-23 LAB — GLUCOSE, CAPILLARY
Glucose-Capillary: 112 mg/dL — ABNORMAL HIGH (ref 70–99)
Glucose-Capillary: 115 mg/dL — ABNORMAL HIGH (ref 70–99)
Glucose-Capillary: 84 mg/dL (ref 70–99)
Glucose-Capillary: 85 mg/dL (ref 70–99)
Glucose-Capillary: 93 mg/dL (ref 70–99)
Glucose-Capillary: 96 mg/dL (ref 70–99)
Glucose-Capillary: 99 mg/dL (ref 70–99)
Glucose-Capillary: 99 mg/dL (ref 70–99)

## 2022-06-23 MED ORDER — MIDODRINE HCL 5 MG PO TABS
2.5000 mg | ORAL_TABLET | Freq: Three times a day (TID) | ORAL | Status: DC
  Administered 2022-06-23 – 2022-06-24 (×4): 2.5 mg via ORAL
  Filled 2022-06-23 (×4): qty 1

## 2022-06-23 MED ORDER — IOHEXOL 300 MG/ML  SOLN
80.0000 mL | Freq: Once | INTRAMUSCULAR | Status: AC | PRN
  Administered 2022-06-23: 80 mL via INTRAVENOUS

## 2022-06-23 MED ORDER — COSYNTROPIN 0.25 MG IJ SOLR
0.2500 mg | Freq: Once | INTRAMUSCULAR | Status: AC
  Administered 2022-06-23: 0.25 mg via INTRAVENOUS
  Filled 2022-06-23: qty 0.25

## 2022-06-23 MED ORDER — ENSURE ENLIVE PO LIQD
237.0000 mL | Freq: Three times a day (TID) | ORAL | Status: DC
  Administered 2022-06-23 – 2022-06-28 (×14): 237 mL via ORAL

## 2022-06-23 MED ORDER — SODIUM CHLORIDE (PF) 0.9 % IJ SOLN
INTRAMUSCULAR | Status: AC
  Filled 2022-06-23: qty 50

## 2022-06-23 MED ORDER — MAGIC MOUTHWASH: 5.0000 mL | Freq: Three times a day (TID) | ORAL | Status: DC | PRN

## 2022-06-23 NOTE — Progress Notes (Cosign Needed)
  Transition of Care [facility]) Screening Note   Patient Details  Name: Brandi Stephens Date of Birth: 07/18/1945   Transition of Care [facility]) CM/SW Contact:    Henrietta Dine, RN Phone Number: 06/23/2022, 10:55 AM    Transition of Care Department [facility]) has reviewed patient and no TOC needs have been identified at this time. We will continue to monitor patient advancement through interdisciplinary progression rounds. If new patient transition needs arise, please place a TOC consult.

## 2022-06-23 NOTE — Evaluation (Signed)
Physical Therapy Evaluation Patient Details Name: Brandi Stephens MRN: [id number] DOB: 22-Apr-1945 Today's Date: 06/23/2022  History of Present Illness  Pt is a 77 year old female admitted 06/20/22 for Severe Sepsis, could be related to norovirus infection, AKI, orthostatic hypotension, N/V.  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  Pt currently with functional limitations due to the deficits listed below (see PT Problem List). Pt will benefit from skilled PT to increase their independence and safety with mobility to allow discharge to the venue listed below.  Pt reports syncopal episodes and spouse following her with a chair while ambulating in home with RW for the past 2 weeks.  MD requesting orthostatic vitals which were obtained.  Due to pt's symptoms and drop in BP deferred ambulation at this time.  Pt reports spouse has been assisting as needed and typically does driving now.   06/23/22 1011  Vital Signs  BP Location Right Arm  BP Method Automatic  Patient Position (if appropriate) Orthostatic Vitals  Orthostatic Lying   BP- Lying 105/61  Pulse- Lying 65  Orthostatic Sitting  BP- Sitting 93/42  Pulse- Sitting 77  Orthostatic Standing at 0 minutes  BP- Standing at 0 minutes (!) 71/39  Pulse- Standing at 0 minutes 76  Orthostatic Standing at 3 minutes  BP- Standing at 3 minutes (!) 74/61  Pulse- Standing at 3 minutes 103        Recommendations for follow up therapy are one component of a multi-disciplinary discharge planning process, led by the attending physician.  Recommendations may be updated based on patient status, additional functional criteria and insurance  authorization.  Follow Up Recommendations Home health PT      Assistance Recommended at Discharge PRN  Patient can return home with the following  Help with stairs or ramp for entrance;A little help with walking and/or transfers;Assistance with cooking/housework;Assist for transportation    Equipment Recommendations None recommended by PT  Recommendations for Other Services       Functional Status Assessment Patient has had a recent decline in their functional status and demonstrates the ability to make significant improvements in function in a reasonable and predictable amount of time.     Precautions / Restrictions Precautions Precautions: Fall Precaution Comments: orthostatic      Mobility  Bed Mobility Overal bed mobility: Modified Independent             General bed mobility comments: elevated HOB    Transfers Overall transfer level: Needs assistance Equipment used: Rolling walker (2 wheels) Transfers: Sit to/from Stand Sit to Stand: Min guard           General transfer comment: min/guard for safety, obtained orthostatic vitals, BP low and pt did report dizziness/darkness however did not need to return to sitting while obtaining vitals, deferred ambulation at this time    Ambulation/Gait                  Stairs            Wheelchair Mobility    Modified Rankin (Stroke Patients Only)       Balance Overall balance assessment: History of Falls (from pt's description, falls appear due to syncopal episodes)  Pertinent Vitals/Pain      Home Living Family/patient expects to be discharged to:: Private residence Living Arrangements: Spouse/significant other Available Help at Discharge: Family Type of Home: House Home Access: Stairs to enter         Home Equipment: Conservation officer, nature (2 wheels) Additional Comments: pt moved from [place] to [facility], has 2 daughters in  [place]    Prior Function Prior Level of Function : Independent/Modified Independent             Mobility Comments: uses RW, spouse follows with chair lately due to dizziness and falls       Hand Dominance        Extremity/Trunk Assessment        Lower Extremity Assessment Lower Extremity Assessment: Overall WFL for tasks assessed    Cervical / Trunk Assessment Cervical / Trunk Assessment: Normal  Communication   Communication: No difficulties  Cognition Arousal/Alertness: Awake/alert Behavior During Therapy: WFL for tasks assessed/performed Overall Cognitive Status: Within Functional Limits for tasks assessed                                          General Comments      Exercises     Assessment/Plan    PT Assessment Patient needs continued PT services  PT Problem List Decreased balance;Decreased mobility;Decreased activity tolerance;Decreased strength;Cardiopulmonary status limiting activity;Decreased knowledge of use of DME       PT Treatment Interventions DME instruction;Gait training;Balance training;Therapeutic exercise;Functional mobility training;Therapeutic activities;Patient/family education    PT Goals (Current goals can be found in the Care Plan section)  Acute Rehab PT Goals PT Goal Formulation: With patient Time For Goal Achievement: 07/07/22 Potential to Achieve Goals: Good    Frequency Min 3X/week     Co-evaluation               AM-PAC PT "6 Clicks" Mobility  Outcome Measure Help needed turning from your back to your side while in a flat bed without using bedrails?: A Little Help needed moving from lying on your back to sitting on the side of a flat bed without using bedrails?: A Little Help needed moving to and from a bed to a chair (including a wheelchair)?: A Little Help needed standing up from a chair using your arms (e.g., wheelchair or bedside chair)?: A Little Help needed to walk in hospital room?: A  Lot Help needed climbing 3-5 steps with a railing? : A Lot 6 Click Score: 16    End of Session Equipment Utilized During Treatment: Gait belt Activity Tolerance: Treatment limited secondary to medical complications (Comment) Patient left: in bed;with call bell/phone within reach   PT Visit Diagnosis: Difficulty in walking, not elsewhere classified (R26.2)    Time: [id number] PT Time Calculation (min) (ACUTE ONLY): 30 min   Charges:   PT Evaluation $PT Eval Low Complexity: 1 Low PT Treatments $Therapeutic Activity: 8-22 mins   Jannette Spanner PT, DPT Physical Therapist Acute Rehabilitation Services Preferred contact method: Secure Chat Weekend Pager Only: [phone] Office: [place] 06/23/2022, 1:22 PM

## 2022-06-23 NOTE — Progress Notes (Addendum)
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  She has lost about 100 pounds in the last year as she was a started on Ozempic.  She presented to the ED with 2 weeks history of nausea, vomiting and diarrhea.  She also reports some abdominal discomfort.  She was found to have lactic acidosis, lactic acid up to 2.9.  She was hypotensive, improved with IV fluids.  He was admitted for severe sepsis. GI Pathogen positive for norovirus.  Assessment & Plan:   Principal Problem:   Severe sepsis with acute organ dysfunction (HCC) Active Problems:   AKI (acute kidney injury) ([place])   Essential hypertension   Type II diabetes mellitus with complication (HCC)  1-Severe Sepsis, could be related to norovirus infection -Presented with hypotension, tachycardia, tachypnea, lactic acidosis. 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  -CT abdomen and pelvis: Shows stool burden, esophagitis -C. difficile negative, GI pathogen positive for norovirus -Was treated with IV antibiotics, which has been discontinued by ID -Blood cultures: No growth to date,   2-Acute kidney injury: Creatinine baseline 0.9 presented with a creatinine of 1.3 in the setting of hypovolemia.   Continue with IV fluids  3-Nausea, vomiting -Suspect related to norovirus -Continue with  IV Protonix, as needed Zofran Report some improvement.   4-Orthostatic Hypotension, recurrent Syncope: Received IV fluids, and IV bolus.  Cosyntropin test rule out adrenal insufficiency, cortisol at 19--21  On 30 day monitor by PCP/  Plan to start Low dose midodrine.  Ted hose  Hypokalemia; Replaced.   Hypomagnesemia; Replaced.   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  ID recommend Repeat CT Lumbar spine.   Estimated body mass index is 29.47 kg/m as calculated from the following:   Height as of this encounter: 5' 7.5" (1.715 m).   Weight as of this encounter: 86.6 kg.   DVT prophylaxis: Lovenox Code Status: Full code Family Communication: Care discussed with patient.  Disposition Plan:  Status is: Inpatient Remains inpatient appropriate because: management of nausea, dehydration.     Consultants:  ID  Procedures:  None  Antimicrobials:    Subjective: She still report dizziness, lightheaded. Nausea is better. She also has issues with her teeth, difficult to eat     Objective: Vitals:   06/22/22 1214 06/22/22 2103 06/23/22 0341 06/23/22 0818  BP: 115/68 117/80 116/79 114/73  Pulse: 77 86 83 61  Resp:  20 20   Temp: 97.9 F (36.6 C) 97.8 F (36.6 C) 97.7 F (36.5 C)   TempSrc: Oral     SpO2: 96% 100% 96%   Weight:      Height:        Intake/Output Summary (Last 24 hours) at 06/23/2022 1013 Last data filed at 06/23/2022 0400 Gross per 24 hour  Intake 1031.36 ml  Output 850 ml  Net 181.36 ml    Filed Weights   06/20/22 1111 06/20/22 2039  Weight: 82.6 kg 86.6 kg    Examination:  General exam: NAD Respiratory system: CTA Cardiovascular system: S 1, S 2 RRR Gastrointestinal system: BS present, soft, nt Central nervous system: alert Extremities: no edema   Data Reviewed: I  have personally reviewed following labs and imaging studies  CBC: Recent Labs  Lab 06/20/22 1113 06/21/22 0051 06/22/22 0434 06/23/22 0800  WBC 11.0* 6.1 5.0 4.4  NEUTROABS 8.3*  --   --   --   HGB 16.2* 12.0 11.2* 12.0  HCT 48.5* 37.2 35.6* 39.4  MCV 93.8 96.6 97.8 103.4*  PLT 272 169 140* 144*    Basic Metabolic Panel: Recent Labs  Lab 06/20/22 1113 06/20/22 1536 06/21/22 0051 06/22/22 0434  06/23/22 0800  NA 143  --  136 141 138  K 3.2*  --  2.7* 3.8 3.9  CL 97*  --  101 112* 110  CO2 31  --  '28 25 22  '$ GLUCOSE 132*  --  78 83 77  BUN 23  --  '18 12 10  '$ CREATININE 1.30*  --  1.08* 0.87 0.93  CALCIUM 11.6*  --  8.9 9.0 8.9  MG  --  1.4* 1.8 1.7  --   PHOS  --   --   --  2.8  --     GFR: Estimated Creatinine Clearance: 59.7 mL/min (by C-G formula based on SCr of 0.93 mg/dL). Liver Function Tests: Recent Labs  Lab 06/20/22 1113 06/21/22 0051  AST 20 18  ALT 13 14  ALKPHOS 84 67  BILITOT 0.8 0.8  PROT 7.3 5.1*  ALBUMIN 4.2 2.8*    No results for input(s): "LIPASE", "AMYLASE" in the last 168 hours. No results for input(s): "AMMONIA" in the last 168 hours. Coagulation Profile: Recent Labs  Lab 06/20/22 1113 06/21/22 0051  INR 0.9 1.0    Cardiac Enzymes: No results for input(s): "CKTOTAL", "CKMB", "CKMBINDEX", "TROPONINI" in the last 168 hours. BNP (last 3 results) No results for input(s): "PROBNP" in the last 8760 hours. HbA1C: No results for input(s): "HGBA1C" in the last 72 hours. CBG: Recent Labs  Lab 06/22/22 2000 06/23/22 0003 06/23/22 0338 06/23/22 0618 06/23/22 0745  GLUCAP 77 99 85 84 99    Lipid Profile: No results for input(s): "CHOL", "HDL", "LDLCALC", "TRIG", "CHOLHDL", "LDLDIRECT" in the last 72 hours. Thyroid Function Tests: No results for input(s): "TSH", "T4TOTAL", "FREET4", "T3FREE", "THYROIDAB" in the last 72 hours. Anemia Panel: No results for input(s): "VITAMINB12", "FOLATE", "FERRITIN", "TIBC", "IRON", "RETICCTPCT" in the last 72 hours. Sepsis Labs: Recent Labs  Lab 06/20/22 1113 06/20/22 1343 06/20/22 1536 06/20/22 1730 06/21/22 0051  PROCALCITON  --   --   --   --  <0.10  LATICACIDVEN 2.9* 2.8* 2.6* 1.9  --      Recent Results (from the past 240 hour(s))  Culture, blood (Routine x 2)     Status: None (Preliminary result)   Collection Time: 06/20/22 11:18 AM   Specimen: Right Antecubital; Blood  Result Value  Ref Range Status   Specimen Description   Final    RIGHT ANTECUBITAL BLOOD Performed at [facility] [address], [place], [place] [address]    Special Requests   Final    BOTTLES DRAWN AEROBIC AND ANAEROBIC Blood Culture adequate volume Performed at [facility], [address], [place], [place] [address]    Culture   Final    NO GROWTH 3 DAYS Performed at [facility], [place] [address], [place], [place] [address]    Report Status PENDING  Incomplete  Culture, blood (Routine x 2)     Status: None (Preliminary result)   Collection Time: 06/20/22 12:20 PM   Specimen: BLOOD RIGHT HAND  Result  Value Ref Range Status   Specimen Description   Final    BLOOD RIGHT HAND Performed at [facility], [address], [place], [place] [address]    Special Requests   Final    BOTTLES DRAWN AEROBIC AND ANAEROBIC Blood Culture adequate volume Performed at [facility], [address] [place], [place] [address]    Culture   Final    NO GROWTH 3 DAYS Performed at [facility], [place] [address] [place], [place] [address]    Report Status PENDING  Incomplete  Resp panel by RT-PCR (RSV, Flu A&B, Covid) Anterior Nasal Swab     Status: None   Collection Time: 06/20/22  1:43 PM   Specimen: Anterior Nasal Swab  Result Value Ref Range Status   SARS Coronavirus 2 by RT PCR NEGATIVE NEGATIVE Final    Comment: (NOTE) SARS-CoV-2 target nucleic acids are NOT DETECTED.  The SARS-CoV-2 RNA is generally detectable in upper respiratory specimens during the acute phase of infection. The lowest concentration of SARS-CoV-2 viral copies this assay can detect is 138 copies/mL. A negative result does not preclude SARS-Cov-2 infection and should not be used as the sole basis for treatment or other patient management decisions. A negative result may occur with  improper specimen collection/handling, submission of specimen other than  nasopharyngeal swab, presence of viral mutation(s) within the areas targeted by this assay, and inadequate number of viral copies(<138 copies/mL). A negative result must be combined with clinical observations, patient history, and epidemiological information. The expected result is Negative.  Fact Sheet for Patients:  EntrepreneurPulse.com.au  Fact Sheet for Healthcare Providers:  IncredibleEmployment.be  This test is no t yet approved or cleared by the [place] FDA and  has been authorized for detection and/or diagnosis of SARS-CoV-2 by FDA under an Emergency Use Authorization (EUA). This EUA will remain  in effect (meaning this test can be used) for the duration of the COVID-19 declaration under Section 564(b)(1) of the Act, 21 U.S.C.section 360bbb-3(b)(1), unless the authorization is terminated  or revoked sooner.       Influenza A by PCR NEGATIVE NEGATIVE Final   Influenza B by PCR NEGATIVE NEGATIVE Final    Comment: (NOTE) The Xpert Xpress SARS-CoV-2/FLU/RSV plus assay is intended as an aid in the diagnosis of influenza from Nasopharyngeal swab specimens and should not be used as a sole basis for treatment. Nasal washings and aspirates are unacceptable for Xpert Xpress SARS-CoV-2/FLU/RSV testing.  Fact Sheet for Patients: EntrepreneurPulse.com.au  Fact Sheet for Healthcare Providers: IncredibleEmployment.be  This test is not yet approved or cleared by the [place] FDA and has been authorized for detection and/or diagnosis of SARS-CoV-2 by FDA under an Emergency Use Authorization (EUA). This EUA will remain in effect (meaning this test can be used) for the duration of the COVID-19 declaration under Section 564(b)(1) of the Act, 21 U.S.C. section 360bbb-3(b)(1), unless the authorization is terminated or revoked.     Resp Syncytial Virus by PCR NEGATIVE NEGATIVE Final    Comment:  (NOTE) Fact Sheet for Patients: EntrepreneurPulse.com.au  Fact Sheet for Healthcare Providers: IncredibleEmployment.be  This test is not yet approved or cleared by the [place] FDA and has been authorized for detection and/or diagnosis of SARS-CoV-2 by FDA under an Emergency Use Authorization (EUA). This EUA will remain in effect (meaning this test can be used) for the duration of the COVID-19 declaration under Section 564(b)(1) of the Act, 21 U.S.C. section 360bbb-3(b)(1), unless the authorization is terminated  or revoked.  Performed at [facility], [place], [place] [address]   C Difficile Quick Screen w PCR reflex     Status: None   Collection Time: 06/21/22  9:09 AM  Result Value Ref Range Status   C Diff antigen NEGATIVE NEGATIVE Final   C Diff toxin NEGATIVE NEGATIVE Final   C Diff interpretation No C. difficile detected.  Final    Comment: Performed at [facility], [place] [address], [place], [place] [address]  Gastrointestinal Panel by PCR , Stool     Status: Abnormal   Collection Time: 06/21/22  9:09 AM  Result Value Ref Range Status   Campylobacter species NOT DETECTED NOT DETECTED Final   Plesimonas shigelloides NOT DETECTED NOT DETECTED Final   Salmonella species NOT DETECTED NOT DETECTED Final   Yersinia enterocolitica NOT DETECTED NOT DETECTED Final   Vibrio species NOT DETECTED NOT DETECTED Final   Vibrio cholerae NOT DETECTED NOT DETECTED Final   Enteroaggregative E coli (EAEC) NOT DETECTED NOT DETECTED Final   Enteropathogenic E coli (EPEC) NOT DETECTED NOT DETECTED Final   Enterotoxigenic E coli (ETEC) NOT DETECTED NOT DETECTED Final   Shiga like toxin producing E coli (STEC) NOT DETECTED NOT DETECTED Final   Shigella/Enteroinvasive E coli (EIEC) NOT DETECTED NOT DETECTED Final   Cryptosporidium NOT DETECTED NOT DETECTED Final   Cyclospora cayetanensis NOT DETECTED  NOT DETECTED Final   Entamoeba histolytica NOT DETECTED NOT DETECTED Final   Giardia lamblia NOT DETECTED NOT DETECTED Final   Adenovirus F40/41 NOT DETECTED NOT DETECTED Final   Astrovirus NOT DETECTED NOT DETECTED Final   Norovirus GI/GII DETECTED (A) NOT DETECTED Final    Comment: RESULT CALLED TO, READ BACK BY AND VERIFIED WITH: Margarita Mail RN '@[id number]'$  06/22/22 ASW    Rotavirus A NOT DETECTED NOT DETECTED Final   Sapovirus (I, II, IV, and V) NOT DETECTED NOT DETECTED Final    Comment: Performed at [facility], [address] [place], [place] [address]         Radiology Studies: No results found.      Scheduled Meds:  allopurinol  300 mg Oral Daily   amitriptyline  100 mg Oral QHS   atorvastatin  80 mg Oral Daily   cefadroxil  500 mg Oral BID   DULoxetine  60 mg Oral Daily   enoxaparin (LOVENOX) injection  40 mg Subcutaneous Q24H   insulin aspart  0-9 Units Subcutaneous Q4H   levothyroxine  75 mcg Oral Q0600   metoprolol tartrate  12.5 mg Oral BID   pantoprazole (PROTONIX) IV  40 mg Intravenous Q12H   primidone  50 mg Oral BID   topiramate  50 mg Oral QHS   Continuous Infusions:  sodium chloride Stopped (06/20/22 1800)   dextrose 5% lactated ringers 75 mL/hr at 06/23/22 0736   lactated ringers Stopped (06/22/22 1900)     LOS: 3 days    Time spent: 35 minutes    Yitzhak Awan A Olivya Sobol, MD [facility]   If 7PM-7AM, please contact night-coverage [email or web address]  06/23/2022, 10:13 AM

## 2022-06-23 NOTE — Plan of Care (Signed)
  Problem: Education: Goal: Knowledge of General Education information will improve Description Including pain rating scale, medication(s)/side effects and non-pharmacologic comfort measures Outcome: Progressing   Problem: Health Behavior/Discharge Planning: Goal: Ability to manage health-related needs will improve Outcome: Progressing   

## 2022-06-24 DIAGNOSIS — A419 Sepsis, unspecified organism: Secondary | ICD-10-CM | POA: Diagnosis not present

## 2022-06-24 DIAGNOSIS — R6520 Severe sepsis without septic shock: Secondary | ICD-10-CM | POA: Diagnosis not present

## 2022-06-24 LAB — GLUCOSE, CAPILLARY
Glucose-Capillary: 98 mg/dL (ref 70–99)
Glucose-Capillary: 92 mg/dL (ref 70–99)
Glucose-Capillary: 98 mg/dL (ref 70–99)
Glucose-Capillary: 75 mg/dL (ref 70–99)
Glucose-Capillary: 90 mg/dL (ref 70–99)
Glucose-Capillary: 98 mg/dL (ref 70–99)

## 2022-06-24 LAB — TSH: TSH: 20.147 u[IU]/mL — ABNORMAL HIGH (ref 0.350–4.500)

## 2022-06-24 MED ORDER — LEVOTHYROXINE SODIUM 25 MCG PO TABS
125.0000 ug | ORAL_TABLET | Freq: Every day | ORAL | Status: DC
  Administered 2022-06-25 – 2022-06-29 (×5): 125 ug via ORAL
  Filled 2022-06-24 (×5): qty 1

## 2022-06-24 MED ORDER — ORAL CARE MOUTH RINSE: 15.0000 mL | OROMUCOSAL | Status: DC | PRN

## 2022-06-24 MED ORDER — LEVOTHYROXINE SODIUM 50 MCG PO TABS
50.0000 ug | ORAL_TABLET | Freq: Once | ORAL | Status: AC
  Administered 2022-06-24: 50 ug via ORAL
  Filled 2022-06-24: qty 1

## 2022-06-24 NOTE — Consult Note (Addendum)
Cardiology Consultation   Patient ID: Brandi Stephens MRN: [id number]; DOB: 05-23-1945  Admit date: 06/20/2022 Date of Consult: 06/24/2022  PCP:  Carlena Hurl, PA-C   [place] [facility] Cardiologist:  Janina Mayo, MD   {  Patient Profile:   Brandi Stephens is a 77 y.o. female with a history of atypical chest pain with normal Myoview in 01/2016 and [id number], chronic diastolic CHF, palpitations with rare PAC and brief SVT noted on monitor in 09/2021, hypertension, hyperlipidemia, prediabetes, hypothyroidism, COPD, chronic osteomyelitis of left spine on suppressive antibiotics, left breast cancer s/p lumpectomy and radiation, and tobacco use who is being seen 06/24/2022 for the evaluation of syncope at the request of Dr. Tyrell Antonio.  History of Present Illness:   Brandi Stephens is a 77 year old female with the above history.  She is present followed by Dr. Debara Pickett now follows with Dr. Phineas Inches.  She has a history of atypical chest pain and palpitations.  View was low risk with no evidence of ischemia in both 01/2016 and 12/2020.  Most recent Echo in 01/2021 showed LVEF of 60-65% with mild LVH and grade 1 diastolic dysfunction as well as mild dilatation of the ascending aorta measuring 41 mm.  Monitor in 09/2021 showed rare PACs and 1 brief run of SVT lasting 9 beats but no significant arrhythmias.  She was recently seen by Diona Browner, NP, after recent ED visit at [facility] on 05/20/2022 for further syncope.  Unfortunately, there were no records available at that visit.  However, she continued to report intermittent dizziness, lightheadedness, and presyncope since the ED visit.  She describes some ringing in her ears as well as weakness in her legs during some of these episodes of dizziness.  Her BP was also low and she reported elevated heart rate.  She denied any chest pain, dyspnea, or significant palpitations.  Records were requested.  She was started on Lopressor 12.5 mg  twice daily and her Bumex was changed to as needed only. Echo and 30 day Event Monitor were ordered for further evaluation. Orthostatic were not checked because patient did not have time to wait for this - she had another doctor's appointment to get to.   Patient presented to the [facility] ED on 06/20/2022 for further evaluation of dehydration secondary to vomiting and poor PO intake.  On arrival to the ED, she was hypotensive with BP of 89/74 and tachycardic. EKG showed sinus tachycardia, rate 125 bpm, with no acute ST/T changes.  High-sensitivity troponin negative x 2. WBC 11.0, Hgb 16.2, Plts 272. Na 143, K 3.2, Glucose 132, BUN 23, Cr 1.30. LFTs normal.  Actiq acid was elevated at 2.9.  And was negative for COVID/influenza/RSV.  Analysis showed trace leukocytes, 100 protein, and elevated specific gravity of 1.046.  Cultures negative to date.  Abdominal /pelvic CT showed distal esophageal wall thickening which may suggest esophagitis and a moderate to large rectal stool burden but no acute findings. GI pathogen came back positive for norovirus. Started on IV fluids and antibiotics and admitted for sepsis.    Orthostatic vitals signs have been positive.  Cardiology was consulted for orthostatic hypotension and recurrent syncope.  At time of this evaluation, patient is resting comfortably no acute distress.  Her nausea and vomiting have improved but she still is unable to eat.  Whenever she eats, she gets nauseous and vomits.  Patient states this has been going on for 2 weeks.  However, she has been having  recurrent syncopal episodes long before this.  She reports multiple syncopal episodes since October 2023 at the time that she was moving.  Most of these episodes have occurred while standing and are preceded by a ringing/warmness in her ears that radiates up and then weakness of her legs.  However, she is also had a couple episodes while sitting down.  She denies any palpitations prior to these events but  does report some heart racing when she regains consciousness.  She denies any seizure-like activity, bowel/bladder incontinence, or tongue biting suggestive of seizure.  Interestingly, she has not had any syncopal episodes over the last 2 weeks why she has been having these GI symptoms.  However, it sounds like this is because she has not been very active during this time.  She has mostly been resting in her family has been taking care of her.  She denies any chest pain or shortness of breath.  No orthopnea or PND.  She has chronic lower extremity edema for which she takes Bumex for but this is actually significantly improved over the past 6 months.  She has not needed any as needed Bumex since she was last seen in our office.  She denies any fevers or GI symptoms.  She has had little bit of a cough no other URI symptoms.  No abnormal bleeding including hematemesis, hematochezia, melena, or hematuria.  She has been treated with IV fluids. Orthostatic vital signs were still positive yesterday with BP dropping from 105/61 (HR 65) supine to 93/42 (HR 77) sitting to 71/39 (HR 76) standing and 74/61 (HR 103) after standing for 3 minutes.  She was started on Midodrine last night and does feel little better with this.   Past Medical History:  Diagnosis Date   AIN (anal intraepithelial neoplasia) anal canal    Anemia    with pregnancy   Anxiety    Arthritis    knees, lower back, knees   Chronic constipation    Chronic diastolic CHF (congestive heart failure) ([place]) 07/05/2019   CKD (chronic kidney disease)    Coarse tremors    Essential   COPD (chronic obstructive pulmonary disease) ([place])    2017 chest CT, history of   Degenerative lumbar spinal stenosis    Depression    Depression 07/05/2019   Drug dependency ([place])    Elevated PTHrP level 05/09/2019   EtOH use 07/05/2019   Pt denies heavy drinking however   GERD (gastroesophageal reflux disease)    pt denies   Gout    Grade I diastolic  dysfunction 0000000   Noted ECHO   Hemorrhoids    History of adenomatous polyp of colon    History of basal cell carcinoma (BCC) excision 2015   nose   History of sepsis 05/14/2014   post lumbar surgery   History of shingles    x2   Hyperlipidemia    Hypertension    Hypothyroidism    Irregular heart beat    history of while undergoing radiation   Malignant neoplasm of upper-inner quadrant of left breast in female, estrogen receptor positive [facility]) oncologist-  dr Jana Hakim--  per lov in epic no recurrence   dx 07-13-2015  Left breast invasive ductal carcinoma, Stage IA, Grade 1 (TXN0),  09-16-2015  s/p  left breast lumpectomy with sln bx's,  completed radiation 11-18-2015,  started antiestrogen therapy 12-14-2015   Neuropathy    Left foot and back of left leg   Obesity (BMI 30-39.9) 07/05/2019   Personal history  of radiation therapy    completed 11-18-2015  left breast   Pneumonia    PONV (postoperative nausea and vomiting)    Prediabetes    diet controlled, no med   Restless leg syndrome    Seasonal allergies    Seizure ([place]) 05/2015   due to sepsis, only one time episode   Tremor    Wears partial dentures    lower     Past Surgical History:  Procedure Laterality Date   ABDOMINAL HYSTERECTOMY  1985   BASAL CELL CARCINOMA EXCISION  10/15   nose   BREAST BIOPSY Right 08/24/2015   BREAST BIOPSY Right 07/13/2015   BREAST BIOPSY Left 07/13/2015   BREAST EXCISIONAL BIOPSY Left 1972   BREAST LUMPECTOMY Left    BREAST LUMPECTOMY WITH RADIOACTIVE SEED AND SENTINEL LYMPH NODE BIOPSY Left 09/16/2015   Procedure: BREAST LUMPECTOMY WITH RADIOACTIVE SEED AND SENTINEL LYMPH NODE BIOPSY;  Surgeon: Stark Klein, MD;  Location: [place];  Service: General;  Laterality: Left;   BREAST REDUCTION SURGERY Bilateral 1998   CATARACT EXTRACTION W/ INTRAOCULAR LENS IMPLANT Left 2016   [place]   hallo   COLONOSCOPY  01/2013   polyps   EYE SURGERY Left  2016   HAND SURGERY Left 02-19-2002   dr Fredna Dow  '@[facility]'$    repair collateral ligament/ MPJ left thumb   HARDWARE REMOVAL N/A 07/24/2019   Procedure: REVISION OF HARDWARE Lumbar two - Sacral one. Extention of Fusion to Lumbar one;  Surgeon: [name], MD;  Location: [place];  Service: Neurosurgery;  Laterality: N/A;   HEMORRHOID SURGERY  2008   HIGH RESOLUTION ANOSCOPY N/A 02/28/2018   Procedure: HIGH RESOLUTION ANOSCOPY WITH BIOPSY;  Surgeon: Leighton Ruff, MD;  Location: [place];  Service: General;  Laterality: N/A;   KNEE ARTHROSCOPY Right 2002   LUMBAR SPINE SURGERY  03-20-2015   dr Kathyrn Sheriff   fusion L4-5, L5-S1   MOUTH SURGERY  07/14/2018   2 infected teeth removed   MULTIPLE TOOTH EXTRACTIONS     with bone grafting lower bottom right and left   REDUCTION MAMMAPLASTY Bilateral    RIGHT COLECTOMY  09-10-2003   dr Margot Chimes '@[facility]'$    multiple colon polyps (per path tubular adenoma's, hyperplastic , benign appendix, benign two lymph nodes   ROTATOR CUFF REPAIR Left 04/2016   TONSILLECTOMY  1969   TUBAL LIGATION Bilateral 1982     Home Medications:  Prior to Admission medications   Medication Sig Start Date End Date Taking? Authorizing Provider  allopurinol (ZYLOPRIM) 300 MG tablet Take 300 mg by mouth daily.   Yes [provider]  amitriptyline (ELAVIL) 100 MG tablet Take 1 tablet (100 mg total) by mouth at bedtime. 08/27/21  Yes Raulkar, Clide Deutscher, MD  atorvastatin (LIPITOR) 80 MG tablet Take 1 tablet (80 mg total) by mouth daily. 06/10/22  Yes Tysinger, Camelia Eng, PA-C  Biotin 3 MG TABS Take 3 mg by mouth daily.   Yes [provider]  bumetanide (BUMEX) 1 MG tablet Take 1 mg by mouth at bedtime.   Yes [provider]  cefadroxil (DURICEF) 500 MG capsule Take 500 mg by mouth 2 (two) times daily.   Yes [provider]  Cholecalciferol (VITAMIN D3) 50 MCG (2000 UT) capsule Take 1 capsule (2,000 Units total) by mouth daily. 08/10/21  Yes  Tysinger, Camelia Eng, PA-C  colchicine 0.6 MG tablet Take 2 tablets at once then take 1 tablet an hour later  then then 1 tablet twice daily x 3 days. Patient taking differently: Take 0.6 mg by mouth daily as needed (gout). 08/10/21  Yes Tysinger, Camelia Eng, PA-C  diazepam (VALIUM) 10 MG tablet TAKE 1 TABLET(10 MG) BY MOUTH EVERY 6 HOURS AS NEEDED FOR ANXIETY Patient taking differently: Take 10 mg by mouth every 6 (six) hours as needed for anxiety. 04/08/22  Yes Raulkar, Clide Deutscher, MD  DULoxetine (CYMBALTA) 60 MG capsule TAKE 1 CAPSULE(60 MG) BY MOUTH DAILY 06/10/22  Yes Tysinger, Camelia Eng, PA-C  Fluticasone-Umeclidin-Vilant ([name] ELLIPTA) 100-62.5-25 MCG/ACT AEPB Inhale 1 puff into the lungs daily. 04/21/22  Yes Tysinger, Camelia Eng, PA-C  HYDROcodone-acetaminophen (NORCO/VICODIN) 5-325 MG tablet Take 1 tablet by mouth every 4 (four) hours as needed for moderate pain. 08/27/21 08/27/22 Yes Raulkar, Clide Deutscher, MD  levothyroxine (SYNTHROID) 75 MCG tablet TAKE 1 TABLET(75 MCG) BY MOUTH DAILY 06/10/22  Yes Tysinger, Camelia Eng, PA-C  Magnesium Gluconate 250 MG TABS Take 1 tablet by mouth daily.   Yes [provider]  metoprolol tartrate (LOPRESSOR) 25 MG tablet Take 0.5 tablets (12.5 mg total) by mouth 2 (two) times daily. 06/07/22  Yes Monge, Helane Gunther, NP  polyethylene glycol powder (GLYCOLAX/MIRALAX) 17 GM/SCOOP powder Take 17 g by mouth daily as needed for moderate constipation.   Yes [provider]  primidone (MYSOLINE) 50 MG tablet TAKE 1 TABLET BY MOUTH TWICE DAILY 11/01/21  Yes Raulkar, Clide Deutscher, MD  Probiotic Product (PROBIOTIC DAILY PO) Take 1 tablet by mouth daily.   Yes [provider]  Propylene Glycol (SYSTANE BALANCE OP) Place 1 drop into both eyes 4 (four) times daily as needed (dry eyes).   Yes [provider]  rOPINIRole (REQUIP) 1 MG tablet TAKE 1 TABLET(1 MG) BY MOUTH AT BEDTIME 06/13/22  Yes Tysinger, Camelia Eng, PA-C  Semaglutide, 1 MG/DOSE, 4 MG/3ML SOPN Inject 1 mg  as directed once a week. 06/10/22  Yes Tysinger, Camelia Eng, PA-C  topiramate (TOPAMAX) 50 MG tablet Take 1 tablet (50 mg total) by mouth at bedtime. 08/10/21  Yes Raulkar, Clide Deutscher, MD  triamcinolone ointment (KENALOG) 0.1 % Apply 1 Application topically 2 (two) times daily.   Yes [provider]  blood glucose meter kit and supplies KIT Dispense based on patient and insurance preference. Use up to four times daily as directed. 01/26/21   Hosie Poisson, MD  Continuous Blood Gluc Sensor (FREESTYLE LIBRE 3 SENSOR) MISC Place 1 sensor on the skin every 14 days. Use to check glucose continuously 06/13/22   Tysinger, Camelia Eng, PA-C  OXYGEN Inhale 2 L into the lungs.    [provider]    Inpatient Medications: Scheduled Meds:  allopurinol  300 mg Oral Daily   amitriptyline  100 mg Oral QHS   atorvastatin  80 mg Oral Daily   cefadroxil  500 mg Oral BID   DULoxetine  60 mg Oral Daily   enoxaparin (LOVENOX) injection  40 mg Subcutaneous Q24H   feeding supplement  237 mL Oral TID BM   insulin aspart  0-9 Units Subcutaneous Q4H   [START ON 06/25/2022] levothyroxine  125 mcg Oral Q0600   midodrine  2.5 mg Oral TID WC   pantoprazole (PROTONIX) IV  40 mg Intravenous Q12H   primidone  50 mg Oral BID   topiramate  50 mg Oral QHS   Continuous Infusions:  sodium chloride Stopped (06/20/22 1800)   dextrose 5% lactated ringers 75 mL/hr at 06/24/22 0645   lactated ringers Stopped (06/22/22  1900)   PRN Meds: sodium chloride, bisacodyl, diphenhydrAMINE, HYDROcodone-acetaminophen, magic mouthwash, ondansetron (ZOFRAN) IV, polyethylene glycol, rOPINIRole  Allergies:    Allergies  Allergen Reactions   Ampicillin Hives and Other (See Comments)    Severe reaction in February 2017 1.5 month to have hives to go away   Gabapentin Swelling    UNSPECIFIED REACTION    Penicillins Hives and Other (See Comments)    Severe reaction in February 2017 1.5 month to have hives to go away Has patient had a  PCN reaction causing immediate rash, facial/tongue/throat swelling, SOB or lightheadedness with hypotension: #  #  #  YES  #  #  #  Has patient had a PCN reaction causing severe rash involving mucus membranes or skin necrosis: No Has patient had a PCN reaction that required hospitalization No Has patient had a PCN reaction occurring within the last 10 years: No    Other Itching    UNSPECIFIED Analgesics    Social History:   Social History   Socioeconomic History   Marital status: Married    Spouse name: robert   Number of children: 3   Years of education: college   Highest education level: Not on file  Occupational History   Occupation: retired  Tobacco Use   Smoking status: Former    Packs/day: 0.50    Years: 35.00    Total pack years: 17.50    Types: Cigarettes    Quit date: 05/22/2018    Years since quitting: 4.0   Smokeless tobacco: Never  Vaping Use   Vaping Use: Never used  Substance and Sexual Activity   Alcohol use: Yes    Comment: 1 glass of wine a night   Drug use: Not Currently    Comment: CBD tincture   Sexual activity: Yes    Birth control/protection: Post-menopausal  Other Topics Concern   Not on file  Social History Narrative   Lives with husband in a 2 story home.  Has 2 living children.  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  Education: college.    Right-handed.   Five cups coffee per week.   Social Determinants of Health   Financial Resource Strain: Low Risk  (03/18/2022)   Overall Financial Resource Strain (CARDIA)    Difficulty of Paying Living Expenses: Not hard at all  Food Insecurity: No Food Insecurity (06/20/2022)   Hunger Vital Sign    Worried About Running Out of Food in the Last Year: Never true    Ran Out of Food in the Last Year: Never true  Transportation Needs: No Transportation Needs (06/20/2022)   PRAPARE - Hydrologist (Medical): No    Lack of Transportation (Non-Medical): No  Physical Activity:  Inactive (03/18/2022)   Exercise Vital Sign    Days of Exercise per Week: 0 days    Minutes of Exercise per Session: 0 min  Stress: No Stress Concern Present (03/18/2022)   [place]    Feeling of Stress : Only a little  Social Connections: Not on file  Intimate Partner Violence: Not At Risk (06/20/2022)   Humiliation, Afraid, Rape, and Kick questionnaire    Fear of Current or Ex-Partner: No    Emotionally Abused: No    Physically Abused: No    Sexually Abused: No    Family History:   Family History  Problem Relation Age of Onset   Atrial fibrillation Mother  Ulcers Father    Breast cancer Maternal Aunt    Lung cancer Maternal Uncle    Stomach cancer Maternal Grandmother    Lung cancer Maternal Aunt    Brain cancer Maternal Aunt    Prostate cancer Maternal Grandfather    Stroke Paternal Grandmother    Tuberculosis Paternal Grandfather    Colon cancer Neg Hx    Esophageal cancer Neg Hx    Rectal cancer Neg Hx      ROS:  Please see the history of present illness.  Review of Systems  Constitutional:  Negative for fever.  HENT:  Negative for congestion.   Respiratory:  Positive for cough. Negative for shortness of breath.   Cardiovascular:  Positive for palpitations and leg swelling. Negative for chest pain, orthopnea and PND.  Gastrointestinal:  Positive for nausea and vomiting. Negative for abdominal pain, blood in stool and melena.  Genitourinary:  Negative for hematuria.  Musculoskeletal:  Negative for myalgias.  Neurological:  Positive for dizziness, loss of consciousness and weakness.  Endo/Heme/Allergies:  Does not bruise/bleed easily.  Psychiatric/Behavioral:  Substance abuse: former tobacco use.     Physical Exam/Data:   Vitals:   06/23/22 1316 06/23/22 2040 06/24/22 0354 06/24/22 1152  BP: 127/80 (!) 125/94 97/65 119/71  Pulse: 86 69 64 67  Resp: '20 16 16 18  '$ Temp: 98 F (36.7 C) 97.7 F  (36.5 C) (!) 97.5 F (36.4 C) 97.7 F (36.5 C)  TempSrc:  Oral Oral Oral  SpO2: 92% 95% 91% 100%  Weight:      Height:        Intake/Output Summary (Last 24 hours) at 06/24/2022 1207 Last data filed at 06/24/2022 1157 Gross per 24 hour  Intake 810 ml  Output 1500 ml  Net -690 ml      06/20/2022    8:39 PM 06/20/2022   11:11 AM 06/07/2022   11:49 AM  Last 3 Weights  Weight (lbs) 191 lb 182 lb 184 lb  Weight (kg) 86.637 kg 82.555 kg 83.462 kg     Body mass index is 29.47 kg/m.  General: 77 y.o. Caucasian female resting comfortably in no acute distress. HEENT: Normocephalic and atraumatic. Sclera clear.  Neck: Supple. No carotid bruits. No JVD. Heart: RRR. Distinct S1 and S2. No murmurs, gallops, or rubs. Radial and distal pedal pulses 2+ and equal bilaterally. Lungs: No increased work of breathing. Clear to ausculation bilaterally. No wheezes, rhonchi, or rales.  Abdomen: Soft, non-distended, and non-tender to palpation. Bowel sounds present. Extremities: Mild edema of left lower extremity which is chronic.    Skin: Warm and dry. Neuro: Alert and oriented x3. No focal deficits. Psych: Normal affect. Responds appropriately.   EKG:  The EKG was personally reviewed and demonstrates:  Sinus tachycardia, rate 125 bpm, with no acute ST/T changes Telemetry:  Telemetry was personally reviewed and demonstrates:  Normal sinus rhythm with rates mostly in the 60s to 70s.   Relevant CV Studies:  Myoview 01/07/2021:   The study is normal. The study is low risk.   No ST deviation was noted.   LV perfusion is normal.   Left ventricular function is normal. Nuclear stress EF: 60 %. The left ventricular ejection fraction is severely decreased (<30%). End diastolic cavity size is normal.   Prior study available for comparison from 01/22/2016.   Low risk stress nuclear study with normal perfusion and normal left ventricular regional and global systolic function. _______________  Limited  Echocardiogram 01/20/2021: Impressions: 1. Limited echo  for LV function   2. Left ventricular ejection fraction, by estimation, is 60 to 65%. The  left ventricle has normal function. There is mild left ventricular  hypertrophy. Left ventricular diastolic parameters are consistent with  Grade I diastolic dysfunction (impaired  relaxation).   3. Aortic dilatation noted. There is mild dilatation of the ascending  aorta, measuring 41 mm.   4. The inferior vena cava is dilated in size with >50% respiratory  variability, suggesting right atrial pressure of 8 mmHg.   Comparison(s): Changes from prior study are noted. 07/07/2020: LVEF 65-70%.  _______________  1 Week Zio Monitor in 09/2021: Patient had a min HR of 56 bpm, max HR of 156 bpm, and avg HR of 86 bpm. Predominant underlying rhythm was Sinus Rhythm. 1 run of Supraventricular Tachycardia occurred lasting 9 beats with a max rate of 156 bpm (avg 123 bpm). Isolated SVEs were rare  (<1.0%), and no SVE Couplets or SVE Triplets were present. No Isolated VEs, VE Couplets, or VE Triplets were present.   Agree with ziopatch. No triggered events.  One run of PACs.  No significant tachyarrhythmia or bradyarrhythmia. No atrial fibrillation or flutter.   Laboratory Data:  High Sensitivity Troponin:   Recent Labs  Lab 06/20/22 1113 06/20/22 1434  TROPONINIHS 16 13     Chemistry Recent Labs  Lab 06/20/22 1536 06/21/22 0051 06/22/22 0434 06/23/22 0800  NA  --  136 141 138  K  --  2.7* 3.8 3.9  CL  --  101 112* 110  CO2  --  '28 25 22  '$ GLUCOSE  --  78 83 77  BUN  --  '18 12 10  '$ CREATININE  --  1.08* 0.87 0.93  CALCIUM  --  8.9 9.0 8.9  MG 1.4* 1.8 1.7  --   GFRNONAA  --  54* >60 >60  ANIONGAP  --  7 4* 6    Recent Labs  Lab 06/20/22 1113 06/21/22 0051  PROT 7.3 5.1*  ALBUMIN 4.2 2.8*  AST 20 18  ALT 13 14  ALKPHOS 84 67  BILITOT 0.8 0.8   Lipids No results for input(s): "CHOL", "TRIG", "HDL", "LABVLDL", "LDLCALC", "CHOLHDL" in  the last 168 hours.  Hematology Recent Labs  Lab 06/21/22 0051 06/22/22 0434 06/23/22 0800  WBC 6.1 5.0 4.4  RBC 3.85* 3.64* 3.81*  HGB 12.0 11.2* 12.0  HCT 37.2 35.6* 39.4  MCV 96.6 97.8 103.4*  MCH 31.2 30.8 31.5  MCHC 32.3 31.5 30.5  RDW 14.7 14.6 14.7  PLT 169 140* 144*   Thyroid  Recent Labs  Lab 06/24/22 0446  TSH 20.147*    BNPNo results for input(s): "BNP", "PROBNP" in the last 168 hours.  DDimer No results for input(s): "DDIMER" in the last 168 hours.   Radiology/Studies:  CT LUMBAR SPINE W CONTRAST  Result Date: 06/23/2022 CLINICAL DATA:  Chronic low back pain with sepsis EXAM: CT LUMBAR SPINE WITH CONTRAST TECHNIQUE: Multidetector CT imaging of the lumbar spine was performed with intravenous contrast administration. RADIATION DOSE REDUCTION: This exam was performed according to the departmental dose-optimization program which includes automated exposure control, adjustment of the mA and/or kV according to patient size and/or use of iterative reconstruction technique. CONTRAST:  35m OMNIPAQUE IOHEXOL 300 MG/ML  SOLN COMPARISON:  No prior dedicated CT of the lumbar spine, correlation is made with 06/20/2022 CT abdomen pelvis and 08/20/2021 MRI lumbar spine FINDINGS: Segmentation: 5 lumbar type vertebral bodies. Alignment: 5 mm retrolisthesis of L1 on L2,  unchanged. 5 mm anterolisthesis of L2 on L3, unchanged. Vertebrae: Redemonstrated compression deformity of T12, with overall unchanged vertebral body height loss compared to the 08/20/2021 MRI, when accounting for differences in technique. Status post posterior fusion and decompression L1-S1, with significant lucency about the bilateral L1 screws (series 3, image 45). No other perihardware lucency. Beam hardening artifact from the hardware limits evaluation of these levels. Paraspinal and other soft tissues: Multiple renal cysts, for which no follow-up is currently indicated. Aortic atherosclerosis. Disc levels: T12-L1: Mild  disc bulge. Mild facet arthropathy. No spinal canal stenosis or neural foraminal narrowing. L1-L2: Retrolisthesis. Status post posterior fusion. Moderate facet arthropathy. No spinal canal stenosis. Moderate bilateral neural foraminal narrowing. L2-L3: Grade 1 anterolisthesis. Status post fusion and decompression. No spinal canal stenosis or neural foraminal narrowing. L3-L4: Status post fusion and decompression. Significant beam hardening artifact limits evaluation of this level. Right eccentric osteophyte formation likely narrows the right lateral recess. No definite spinal canal stenosis. Mild right neural foraminal narrowing. L4-L5: Status post fusion and decompression. Significant beam hardening artifact limits evaluation of this level. No definite spinal canal stenosis or neural foraminal narrowing. L5-S1: Status post fusion and decompression. Significant beam hardening artifact limits evaluation of this level. No spinal canal stenosis or neural foraminal narrowing. IMPRESSION: 1. Status post posterior fusion and decompression L1-S1, with significant lucency about the bilateral L1 screws, concerning for loosening. Beam hardening artifact from the hardware limits evaluation of these levels, however no definite spinal canal stenosis is seen. 2. Redemonstrated compression deformity of T12, with likely similar vertebral body height loss compared to the 08/20/2021 MRI, when accounting for differences in technique. 3. Right eccentric osteophyte formation at L3-L4 likely narrows the right lateral recess. Mild right neural foraminal narrowing. 4. Moderate bilateral neural foraminal narrowing at L1-L2. 5. Aortic atherosclerosis. Aortic Atherosclerosis (ICD10-I70.0). Electronically Signed   By: Merilyn Baba M.D.   On: 06/23/2022 20:19   CT ABDOMEN PELVIS W CONTRAST  Result Date: 06/20/2022 CLINICAL DATA:  Bowel obstruction suspected N/V for 2 weeks, hx of lumbar osteomyelitis EXAM: CT ABDOMEN AND PELVIS WITH  CONTRAST TECHNIQUE: Multidetector CT imaging of the abdomen and pelvis was performed using the standard protocol following bolus administration of intravenous contrast. RADIATION DOSE REDUCTION: This exam was performed according to the departmental dose-optimization program which includes automated exposure control, adjustment of the mA and/or kV according to patient size and/or use of iterative reconstruction technique. CONTRAST:  37m OMNIPAQUE IOHEXOL 350 MG/ML SOLN COMPARISON:  CT 07/09/2019 FINDINGS: Lower chest: Lingular scarring/subsegmental atelectasis. Unchanged subendocardial hypoattenuation/fat density in the left ventricular septum and in the right ventricle. Hepatobiliary: No focal liver abnormality is seen. The gallbladder is unremarkable. Pancreas: Unremarkable. No pancreatic ductal dilatation or surrounding inflammatory changes. Spleen: Normal in size without focal abnormality. Adrenals/Urinary Tract: Adrenal glands are unremarkable. No hydronephrosis or nephroureterolithiasis. Small bilateral renal cysts which appear simple require no follow-up imaging. Bladder is minimally distended. Stomach/Bowel: There is distal esophageal wall thickening. The stomach is within normal limits. There is no evidence of bowel obstruction.Prior appendectomy. Moderate to large rectal stool burden. Sigmoid diverticulosis. No acute diverticulitis. Vascular/Lymphatic: Aortoiliac atherosclerosis. No AAA. No lymphadenopathy. Reproductive: Prior hysterectomy. Other: No abdominal wall hernia or abnormality. No abdominopelvic ascites. Musculoskeletal: No acute osseous abnormality. No suspicious osseous lesion. There is a chronic compression deformity of T12 without evidence of progressive height loss. Prior fusion from L1-S1 with unchanged mild residual retrolisthesis at L1-L2. There is mild lucency along the L1 pedicle screws. Hardware is intact. IMPRESSION:  Distal esophageal wall thickening, may suggest esophagitis. Moderate  to large rectal stool burden. No bowel obstruction or other acute findings in the abdomen or pelvis. Chronic compression deformity of T12 without evidence of progressive height loss. Prior fusion from L1-S1 with unchanged mild residual retrolisthesis at L1-L2. Mild lucency along the L1 pedicle screws. Electronically Signed   By: Maurine Simmering M.D.   On: 06/20/2022 13:52   DG Chest Port 1 View  Result Date: 06/20/2022 CLINICAL DATA:  Vomiting for 2 weeks. EXAM: PORTABLE CHEST 1 VIEW COMPARISON:  CT chest and chest x-ray dated January 18, 2021. FINDINGS: New loop recorder noted. Heart remains at the upper limits of normal in size. Normal pulmonary vascularity. Chronic emphysematous changes and mildly coarsened interstitial markings are similar. No focal consolidation, pleural effusion, or pneumothorax. No acute osseous abnormality. IMPRESSION: 1. No active disease. 2. COPD. Electronically Signed   By: Titus Dubin M.D.   On: 06/20/2022 12:49     Assessment and Plan:   Recurrent Syncope Orthostatic Hypotension Patient presented with nausea and vomiting with associated dehydration and was admitted with severe sepsis thought to be related to norovirus infection. However, she has been having recurrent syncopal episodes (she estimates over 20 of them) since 01/2022. Most of then occur when she is standing and are proceeding by ringing/warmness in her ears and weakness as well as dizziness. She was seen in the office a couple of weeks ago and Echo and 30 day Event Monitor were ordered for further evaluation. She is currently still wearing the Event Monitor. She is significantly orthostatic here in the hospital. No concerning arrhythmias noted on telemetry. She is being treated with IV fluids but was still orthostatic yesterday and was started on Midodrine. Will repeat orthostatic vitals signs today. Will go ahead and update Echo during this admission (will cancel outpatient one). Continue IV fluids and Midodrine  2.'5mg'$  three times daily. Agree with stopping Lorpessor. She is still not able to eat or drink much due to persistent nausea/vomiting with this which is likely contributing. Will defer management of this to primary team. I do not think her syncopal episodes are due to any significant arrhyhmias but will have her complete her outpatietn monitor. Per [place] [employer], she should not drive for 6 months.  Chronic Diastolic CHF Last Echo in 01/2021 showed LVEF of 60-65% with mild LVH and grade 1 diastolic dysfunction as well as mild dilatation of the ascending aorta measuring 41 mm.  - Euvolemic on exam.  - Only on Bumex as needed at home and has not needed this in the past couple of weeks. Continue to hold.  - Continue to monitor volume status closely with IV fluids.   Otherwise per primary team: - Sepsis - Norovirus infection  - Hypothyroidism - Chronic osteomyelitis - AKI: resolved  - Hypokalemia: resolved   Risk Assessment/Risk Scores:    New York Heart Association (NYHA) Functional Class NYHA Class I   For questions or updates, please contact [facility] Please consult [email or web address] for contact info under    Signed, Darreld Mclean, PA-C  06/24/2022 12:07 PM  Personally seen and examined. 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   She has had more episodes of near passing out than she can count.  No CP.  NO SOB.  No palpitations.  She feels the room spinning.  Feels  dizzy.  Most prominent when she stands  Sometimes in the room. She has had years of LE edema that had improved, slowly, with diuresis.  Her bumex has been decreased to PRN.  She was doing better until two weeks of nasuea/vomiting.  Given her GI sx she has been given IVF which has worsened symptoms.   Exam notable for bmild non pitting edema bilaterally; exam otherwise as above. Tele: SR 2022  Echo: hyperdynamic function with no rWMA or significant valve dysfunction  Would recommend  - agree with stopping BB and bumex - will repeat echo but I do not suspect this is cardiac in nature - midodrine 2.5 and compression stockings; if worsening add abdominal binder and increase to 5 mg - would not use florinef due to LE edema - if echo is normal likely outpatient f/u  Rudean Haskell, MD [facility]  [address] [place], [place] [address] [phone]  2:14 PM

## 2022-06-24 NOTE — Progress Notes (Addendum)
[facility] Infectious Diseases Follow Up Note  Patient Identification: Patient Name: Brandi Stephens MRN: [id number] [place] Date: 06/20/2022 11:38 AM Age: 77 y.o.Today's Date: 06/24/2022  Reason for Visit: h/o MSSSE lumbar infection, Noro  virus diarrhea   Principal Problem:   Severe sepsis with acute organ dysfunction [facility]) Active Problems:   Essential hypertension   AKI (acute kidney injury) ([place])   Type II diabetes mellitus with complication (HCC)   Antibiotics:  Vancomycin 3/4-3/5 Cefepime 3/4-2/5 Cefadroxill 3/6-c  Lines/Hardwares: lumbar hardware+  Interval Events: Remains afebrile, no leukocytosis.  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 Megan Salon, 100 pound weight loss last year due to [place] who presented to the ED with dizziness, vomiting, diarrhea dehydration and hypotension/inability to tolerate p.o. ED presentation concerning for sepsis.   # Norovirus gastroenteritis - improved, able to take PO, diarrhea resolved  # h/o MSSE lumbar verterbral infection with hardware - CT L-spine 3/7 with significant lucency around the bilateral L1 screws concerning for loosening. ESR and CRP downtrending, Procalcitonin negative  # Esophagitis in CT - likely related to nausea/vomiting  Recommendations Discuss CT l spine findings with neurosurgery to see any recommendations. If no surgical intervention, plan to continue cefadroxil as is for suppression  She already has a fu appt with Dr Megan Salon 3/28.  Management of orthostatic hypotension/dizziness per primary  ID will SO for now, please call any new changes or new recs from Neurosurgery  D/w primary   Rest of the management as per the primary team. Thank you for the consult. Please page with pertinent questions or  concerns.  ______________________________________________________________________ Subjective patient seen and examined at the bedside. She is tolerating liquid OK, but not so much solid. Diarrhea has resolved. Back pain is chronic and fluctuates. Denies fevers, chills.   Past Medical History:  Diagnosis Date   AIN (anal intraepithelial neoplasia) anal canal    Anemia    with pregnancy   Anxiety    Arthritis    knees, lower back, knees   Chronic constipation    Chronic diastolic CHF (congestive heart failure) ([place]) 07/05/2019   CKD (chronic kidney disease)    Coarse tremors    Essential   COPD (chronic obstructive pulmonary disease) ([place])    2017 chest CT, history of   Degenerative lumbar spinal stenosis    Depression    Depression 07/05/2019   Drug dependency ([place])    Elevated PTHrP level 05/09/2019   EtOH use 07/05/2019   Pt denies heavy drinking however   GERD (gastroesophageal reflux disease)    pt denies   Gout    Grade I diastolic dysfunction 0000000   Noted ECHO   Hemorrhoids    History of adenomatous polyp of colon    History of basal cell carcinoma (BCC) excision 2015   nose   History of sepsis 05/14/2014   post lumbar surgery   History of shingles    x2   Hyperlipidemia    Hypertension    Hypothyroidism    Irregular heart beat    history of while undergoing radiation   Malignant neoplasm of upper-inner quadrant of left breast in female, estrogen receptor positive [facility]) oncologist-  dr Jana Hakim--  per lov in epic no recurrence   dx 07-13-2015  Left breast invasive ductal carcinoma, Stage IA, Grade 1 (TXN0),  09-16-2015  s/p  left breast lumpectomy with sln bx's,  completed  radiation 11-18-2015,  started antiestrogen therapy 12-14-2015   Neuropathy    Left foot and back of left leg   Obesity (BMI 30-39.9) 07/05/2019   Personal history of radiation therapy    completed 11-18-2015  left breast   Pneumonia    PONV (postoperative nausea and vomiting)     Prediabetes    diet controlled, no med   Restless leg syndrome    Seasonal allergies    Seizure ([place]) 05/2015   due to sepsis, only one time episode   Tremor    Wears partial dentures    lower    Past Surgical History:  Procedure Laterality Date   ABDOMINAL HYSTERECTOMY  1985   BASAL CELL CARCINOMA EXCISION  10/15   nose   BREAST BIOPSY Right 08/24/2015   BREAST BIOPSY Right 07/13/2015   BREAST BIOPSY Left 07/13/2015   BREAST EXCISIONAL BIOPSY Left 1972   BREAST LUMPECTOMY Left    BREAST LUMPECTOMY WITH RADIOACTIVE SEED AND SENTINEL LYMPH NODE BIOPSY Left 09/16/2015   Procedure: BREAST LUMPECTOMY WITH RADIOACTIVE SEED AND SENTINEL LYMPH NODE BIOPSY;  Surgeon: Stark Klein, MD;  Location: [place];  Service: General;  Laterality: Left;   BREAST REDUCTION SURGERY Bilateral 1998   CATARACT EXTRACTION W/ INTRAOCULAR LENS IMPLANT Left 2016   CERVICAL SPINE SURGERY  1995   hallo   COLONOSCOPY  01/2013   polyps   EYE SURGERY Left 2016   HAND SURGERY Left 02-19-2002   dr Fredna Dow  '@[facility]'$    repair collateral ligament/ MPJ left thumb   HARDWARE REMOVAL N/A 07/24/2019   Procedure: REVISION OF HARDWARE Lumbar two - Sacral one. Extention of Fusion to Lumbar one;  Surgeon: [name], MD;  Location: [place];  Service: Neurosurgery;  Laterality: N/A;   HEMORRHOID SURGERY  2008   HIGH RESOLUTION ANOSCOPY N/A 02/28/2018   Procedure: HIGH RESOLUTION ANOSCOPY WITH BIOPSY;  Surgeon: Leighton Ruff, MD;  Location: [place];  Service: General;  Laterality: N/A;   KNEE ARTHROSCOPY Right 2002   LUMBAR SPINE SURGERY  03-20-2015   dr Kathyrn Sheriff   fusion L4-5, L5-S1   MOUTH SURGERY  07/14/2018   2 infected teeth removed   MULTIPLE TOOTH EXTRACTIONS     with bone grafting lower bottom right and left   REDUCTION MAMMAPLASTY Bilateral    RIGHT COLECTOMY  09-10-2003   dr Margot Chimes '@[facility]'$    multiple colon polyps (per path tubular adenoma's, hyperplastic , benign appendix,  benign two lymph nodes   ROTATOR CUFF REPAIR Left 04/2016   TONSILLECTOMY  1969   TUBAL LIGATION Bilateral 1982   Vitals BP 97/65 (BP Location: Right Arm)   Pulse 64   Temp (!) 97.5 F (36.4 C) (Oral)   Resp 16   Ht 5' 7.5" (1.715 m)   Wt 86.6 kg   SpO2 91%   BMI 29.47 kg/m     Physical Exam Constitutional: Elderly white female lying in the bed and appears comfortable    Comments:   Cardiovascular:     Rate and Rhythm: Normal rate and regular rhythm.     Heart sounds: s1s2   Pulmonary:     Effort: Pulmonary effort is normal on room air.     Comments: Normal breath sounds  Abdominal:     Palpations: Abdomen is soft.     Tenderness: Nontender and nondistended  Musculoskeletal:        General: No swelling or tenderness in peripheral joints.  No vertebral tenderness.  Lumbar surgical  site has healed  Skin:    Comments: No rashes  Neurological:     General: awake, alert and oriented, grossly nonfocal and ambulatory  Psychiatric:        Mood and Affect: Mood normal.   Pertinent Microbiology Results for orders placed or performed during the hospital encounter of 06/20/22  Culture, blood (Routine x 2)     Status: None (Preliminary result)   Collection Time: 06/20/22 11:18 AM   Specimen: Right Antecubital; Blood  Result Value Ref Range Status   Specimen Description   Final    RIGHT ANTECUBITAL BLOOD Performed at [facility], [place] [address] [place], [place] [address]    Special Requests   Final    BOTTLES DRAWN AEROBIC AND ANAEROBIC Blood Culture adequate volume Performed at [facility], [address], [place], [place] [address]    Culture   Final    NO GROWTH 4 DAYS Performed at [facility], [place] [address] [place], [place] [address]    Report Status PENDING  Incomplete  Culture, blood (Routine x 2)     Status: None (Preliminary result)   Collection Time: 06/20/22 12:20 PM   Specimen: BLOOD RIGHT HAND  Result Value Ref  Range Status   Specimen Description   Final    BLOOD RIGHT HAND Performed at [facility], [address] [place], [place] [address]    Special Requests   Final    BOTTLES DRAWN AEROBIC AND ANAEROBIC Blood Culture adequate volume Performed at [facility], [address], [place], [place] [address]    Culture   Final    NO GROWTH 4 DAYS Performed at [facility], [place] [address], [place], [place] [address]    Report Status PENDING  Incomplete  Resp panel by RT-PCR (RSV, Flu A&B, Covid) Anterior Nasal Swab     Status: None   Collection Time: 06/20/22  1:43 PM   Specimen: Anterior Nasal Swab  Result Value Ref Range Status   SARS Coronavirus 2 by RT PCR NEGATIVE NEGATIVE Final    Comment: (NOTE) SARS-CoV-2 target nucleic acids are NOT DETECTED.  The SARS-CoV-2 RNA is generally detectable in upper respiratory specimens during the acute phase of infection. The lowest concentration of SARS-CoV-2 viral copies this assay can detect is 138 copies/mL. A negative result does not preclude SARS-Cov-2 infection and should not be used as the sole basis for treatment or other patient management decisions. A negative result may occur with  improper specimen collection/handling, submission of specimen other than nasopharyngeal swab, presence of viral mutation(s) within the areas targeted by this assay, and inadequate number of viral copies(<138 copies/mL). A negative result must be combined with clinical observations, patient history, and epidemiological information. The expected result is Negative.  Fact Sheet for Patients:  EntrepreneurPulse.com.au  Fact Sheet for Healthcare Providers:  IncredibleEmployment.be  This test is no t yet approved or cleared by the [place] FDA and  has been authorized for detection and/or diagnosis of SARS-CoV-2 by FDA under an Emergency Use Authorization (EUA). This EUA will  remain  in effect (meaning this test can be used) for the duration of the COVID-19 declaration under Section 564(b)(1) of the Act, 21 U.S.C.section 360bbb-3(b)(1), unless the authorization is terminated  or revoked sooner.       Influenza A by PCR NEGATIVE NEGATIVE Final   Influenza B by PCR NEGATIVE NEGATIVE Final    Comment: (NOTE) The Xpert Xpress SARS-CoV-2/FLU/RSV plus assay is  intended as an aid in the diagnosis of influenza from Nasopharyngeal swab specimens and should not be used as a sole basis for treatment. Nasal washings and aspirates are unacceptable for Xpert Xpress SARS-CoV-2/FLU/RSV testing.  Fact Sheet for Patients: EntrepreneurPulse.com.au  Fact Sheet for Healthcare Providers: IncredibleEmployment.be  This test is not yet approved or cleared by the [place] FDA and has been authorized for detection and/or diagnosis of SARS-CoV-2 by FDA under an Emergency Use Authorization (EUA). This EUA will remain in effect (meaning this test can be used) for the duration of the COVID-19 declaration under Section 564(b)(1) of the Act, 21 U.S.C. section 360bbb-3(b)(1), unless the authorization is terminated or revoked.     Resp Syncytial Virus by PCR NEGATIVE NEGATIVE Final    Comment: (NOTE) Fact Sheet for Patients: EntrepreneurPulse.com.au  Fact Sheet for Healthcare Providers: IncredibleEmployment.be  This test is not yet approved or cleared by the [place] FDA and has been authorized for detection and/or diagnosis of SARS-CoV-2 by FDA under an Emergency Use Authorization (EUA). This EUA will remain in effect (meaning this test can be used) for the duration of the COVID-19 declaration under Section 564(b)(1) of the Act, 21 U.S.C. section 360bbb-3(b)(1), unless the authorization is terminated or revoked.  Performed at [facility], [place],  [place] [address]   C Difficile Quick Screen w PCR reflex     Status: None   Collection Time: 06/21/22  9:09 AM  Result Value Ref Range Status   C Diff antigen NEGATIVE NEGATIVE Final   C Diff toxin NEGATIVE NEGATIVE Final   C Diff interpretation No C. difficile detected.  Final    Comment: Performed at [facility], [place] [address] [place], [place] [address]  Gastrointestinal Panel by PCR , Stool     Status: Abnormal   Collection Time: 06/21/22  9:09 AM  Result Value Ref Range Status   Campylobacter species NOT DETECTED NOT DETECTED Final   Plesimonas shigelloides NOT DETECTED NOT DETECTED Final   Salmonella species NOT DETECTED NOT DETECTED Final   Yersinia enterocolitica NOT DETECTED NOT DETECTED Final   Vibrio species NOT DETECTED NOT DETECTED Final   Vibrio cholerae NOT DETECTED NOT DETECTED Final   Enteroaggregative E coli (EAEC) NOT DETECTED NOT DETECTED Final   Enteropathogenic E coli (EPEC) NOT DETECTED NOT DETECTED Final   Enterotoxigenic E coli (ETEC) NOT DETECTED NOT DETECTED Final   Shiga like toxin producing E coli (STEC) NOT DETECTED NOT DETECTED Final   Shigella/Enteroinvasive E coli (EIEC) NOT DETECTED NOT DETECTED Final   Cryptosporidium NOT DETECTED NOT DETECTED Final   Cyclospora cayetanensis NOT DETECTED NOT DETECTED Final   Entamoeba histolytica NOT DETECTED NOT DETECTED Final   Giardia lamblia NOT DETECTED NOT DETECTED Final   Adenovirus F40/41 NOT DETECTED NOT DETECTED Final   Astrovirus NOT DETECTED NOT DETECTED Final   Norovirus GI/GII DETECTED (A) NOT DETECTED Final    Comment: RESULT CALLED TO, READ BACK BY AND VERIFIED WITH: Margarita Mail RN '@[id number]'$  06/22/22 ASW    Rotavirus A NOT DETECTED NOT DETECTED Final   Sapovirus (I, II, IV, and V) NOT DETECTED NOT DETECTED Final    Comment: Performed at [facility], [place] [place], [place] [address]   *Note: Due to a large number of results and/or encounters for the requested time period,  some results have not been displayed. A complete set of results can be found in Results Review.   Pertinent Lab.  Latest Ref Rng & Units 06/23/2022    8:00 AM 06/22/2022    4:34 AM 06/21/2022   12:51 AM  CBC  WBC 4.0 - 10.5 K/uL 4.4  5.0  6.1   Hemoglobin 12.0 - 15.0 g/dL 12.0  11.2  12.0   Hematocrit 36.0 - 46.0 % 39.4  35.6  37.2   Platelets 150 - 400 K/uL 144  140  169       Latest Ref Rng & Units 06/23/2022    8:00 AM 06/22/2022    4:34 AM 06/21/2022   12:51 AM  CMP  Glucose 70 - 99 mg/dL 77  83  78   BUN 8 - 23 mg/dL '10  12  18   '$ Creatinine 0.44 - 1.00 mg/dL 0.93  0.87  1.08   Sodium 135 - 145 mmol/L 138  141  136   Potassium 3.5 - 5.1 mmol/L 3.9  3.8  2.7   Chloride 98 - 111 mmol/L 110  112  101   CO2 22 - 32 mmol/L '22  25  28   '$ Calcium 8.9 - 10.3 mg/dL 8.9  9.0  8.9   Total Protein 6.5 - 8.1 g/dL   5.1   Total Bilirubin 0.3 - 1.2 mg/dL   0.8   Alkaline Phos 38 - 126 U/L   67   AST 15 - 41 U/L   18   ALT 0 - 44 U/L   14      Pertinent Imaging today Plain films and CT images have been personally visualized and interpreted; radiology reports have been reviewed. Decision making incorporated into the Impression / Recommendations.  CT LUMBAR SPINE W CONTRAST  Result Date: 06/23/2022 CLINICAL DATA:  Chronic low back pain with sepsis EXAM: CT LUMBAR SPINE WITH CONTRAST TECHNIQUE: Multidetector CT imaging of the lumbar spine was performed with intravenous contrast administration. RADIATION DOSE REDUCTION: This exam was performed according to the departmental dose-optimization program which includes automated exposure control, adjustment of the mA and/or kV according to patient size and/or use of iterative reconstruction technique. CONTRAST:  57m OMNIPAQUE IOHEXOL 300 MG/ML  SOLN COMPARISON:  No prior dedicated CT of the lumbar spine, correlation is made with 06/20/2022 CT abdomen pelvis and 08/20/2021 MRI lumbar spine FINDINGS: Segmentation: 5 lumbar type vertebral bodies. Alignment: 5  mm retrolisthesis of L1 on L2, unchanged. 5 mm anterolisthesis of L2 on L3, unchanged. Vertebrae: Redemonstrated compression deformity of T12, with overall unchanged vertebral body height loss compared to the 08/20/2021 MRI, when accounting for differences in technique. Status post posterior fusion and decompression L1-S1, with significant lucency about the bilateral L1 screws (series 3, image 45). No other perihardware lucency. Beam hardening artifact from the hardware limits evaluation of these levels. Paraspinal and other soft tissues: Multiple renal cysts, for which no follow-up is currently indicated. Aortic atherosclerosis. Disc levels: T12-L1: Mild disc bulge. Mild facet arthropathy. No spinal canal stenosis or neural foraminal narrowing. L1-L2: Retrolisthesis. Status post posterior fusion. Moderate facet arthropathy. No spinal canal stenosis. Moderate bilateral neural foraminal narrowing. L2-L3: Grade 1 anterolisthesis. Status post fusion and decompression. No spinal canal stenosis or neural foraminal narrowing. L3-L4: Status post fusion and decompression. Significant beam hardening artifact limits evaluation of this level. Right eccentric osteophyte formation likely narrows the right lateral recess. No definite spinal canal stenosis. Mild right neural foraminal narrowing. L4-L5: Status post fusion and decompression. Significant beam hardening artifact limits evaluation of this level. No definite spinal canal stenosis or neural foraminal narrowing. L5-S1: Status post  fusion and decompression. Significant beam hardening artifact limits evaluation of this level. No spinal canal stenosis or neural foraminal narrowing. IMPRESSION: 1. Status post posterior fusion and decompression L1-S1, with significant lucency about the bilateral L1 screws, concerning for loosening. Beam hardening artifact from the hardware limits evaluation of these levels, however no definite spinal canal stenosis is seen. 2. Redemonstrated  compression deformity of T12, with likely similar vertebral body height loss compared to the 08/20/2021 MRI, when accounting for differences in technique. 3. Right eccentric osteophyte formation at L3-L4 likely narrows the right lateral recess. Mild right neural foraminal narrowing. 4. Moderate bilateral neural foraminal narrowing at L1-L2. 5. Aortic atherosclerosis. Aortic Atherosclerosis (ICD10-I70.0). Electronically Signed   By: Merilyn Baba M.D.   On: 06/23/2022 20:19   CT ABDOMEN PELVIS W CONTRAST  Result Date: 06/20/2022 CLINICAL DATA:  Bowel obstruction suspected N/V for 2 weeks, hx of lumbar osteomyelitis EXAM: CT ABDOMEN AND PELVIS WITH CONTRAST TECHNIQUE: Multidetector CT imaging of the abdomen and pelvis was performed using the standard protocol following bolus administration of intravenous contrast. RADIATION DOSE REDUCTION: This exam was performed according to the departmental dose-optimization program which includes automated exposure control, adjustment of the mA and/or kV according to patient size and/or use of iterative reconstruction technique. CONTRAST:  89m OMNIPAQUE IOHEXOL 350 MG/ML SOLN COMPARISON:  CT 07/09/2019 FINDINGS: Lower chest: Lingular scarring/subsegmental atelectasis. Unchanged subendocardial hypoattenuation/fat density in the left ventricular septum and in the right ventricle. Hepatobiliary: No focal liver abnormality is seen. The gallbladder is unremarkable. Pancreas: Unremarkable. No pancreatic ductal dilatation or surrounding inflammatory changes. Spleen: Normal in size without focal abnormality. Adrenals/Urinary Tract: Adrenal glands are unremarkable. No hydronephrosis or nephroureterolithiasis. Small bilateral renal cysts which appear simple require no follow-up imaging. Bladder is minimally distended. Stomach/Bowel: There is distal esophageal wall thickening. The stomach is within normal limits. There is no evidence of bowel obstruction.Prior appendectomy. Moderate to  large rectal stool burden. Sigmoid diverticulosis. No acute diverticulitis. Vascular/Lymphatic: Aortoiliac atherosclerosis. No AAA. No lymphadenopathy. Reproductive: Prior hysterectomy. Other: No abdominal wall hernia or abnormality. No abdominopelvic ascites. Musculoskeletal: No acute osseous abnormality. No suspicious osseous lesion. There is a chronic compression deformity of T12 without evidence of progressive height loss. Prior fusion from L1-S1 with unchanged mild residual retrolisthesis at L1-L2. There is mild lucency along the L1 pedicle screws. Hardware is intact. IMPRESSION: Distal esophageal wall thickening, may suggest esophagitis. Moderate to large rectal stool burden. No bowel obstruction or other acute findings in the abdomen or pelvis. Chronic compression deformity of T12 without evidence of progressive height loss. Prior fusion from L1-S1 with unchanged mild residual retrolisthesis at L1-L2. Mild lucency along the L1 pedicle screws. Electronically Signed   By: JMaurine SimmeringM.D.   On: 06/20/2022 13:52   DG Chest Port 1 View  Result Date: 06/20/2022 CLINICAL DATA:  Vomiting for 2 weeks. EXAM: PORTABLE CHEST 1 VIEW COMPARISON:  CT chest and chest x-ray dated January 18, 2021. FINDINGS: New loop recorder noted. Heart remains at the upper limits of normal in size. Normal pulmonary vascularity. Chronic emphysematous changes and mildly coarsened interstitial markings are similar. No focal consolidation, pleural effusion, or pneumothorax. No acute osseous abnormality. IMPRESSION: 1. No active disease. 2. COPD. Electronically Signed   By: WTitus DubinM.D.   On: 06/20/2022 12:49     I spent 55 minutes for this patient encounter including review of prior medical records, coordination of care with primary/other specialist with greater than 50% of time being face to face/counseling and discussing  diagnostics/treatment plan with the patient/family.  Electronically signed by:   Rosiland Oz,  MD Infectious Disease Physician [facility] Pager: [phone]

## 2022-06-24 NOTE — Care Management Important Message (Signed)
Important Message  Patient Details IM Letter given. Name: Brandi Stephens MRN: [id number] Date of Birth: Dec 19, 1945   Medicare Important Message Given:  Yes     Kerin Salen 06/24/2022, 10:07 AM

## 2022-06-24 NOTE — Plan of Care (Signed)
  Problem: Education: Goal: Knowledge of General Education information will improve Description Including pain rating scale, medication(s)/side effects and non-pharmacologic comfort measures Outcome: Progressing   Problem: Health Behavior/Discharge Planning: Goal: Ability to manage health-related needs will improve Outcome: Progressing   

## 2022-06-24 NOTE — Progress Notes (Signed)
PROGRESS NOTE    Brandi Stephens  [id number] DOB: July 20, 1945 DOA: 06/20/2022 PCP: Carlena Hurl, PA-C   Brief Narrative: 77 year old with past medical history significant for diabetes type 2, hypertension, chronic osteomyelitis of lumbar spine with MSSA on suppressive antibiotics.  She has lost about 100 pounds in the last year as she was a started on Ozempic.  She presented to the ED with 2 weeks history of nausea, vomiting and diarrhea.  She also reports some abdominal discomfort.  She was found to have lactic acidosis, lactic acid up to 2.9.  She was hypotensive, improved with IV fluids.  He was admitted for severe sepsis. GI Pathogen positive for norovirus.  Assessment & Plan:   Principal Problem:   Severe sepsis with acute organ dysfunction (HCC) Active Problems:   AKI (acute kidney injury) ([place])   Essential hypertension   Type II diabetes mellitus with complication ([place])  1-Severe Sepsis, could be related to norovirus infection: resolved.  -Presented with hypotension, tachycardia, tachypnea, lactic acidosis. 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 CT negative for infection.  -CT abdomen and pelvis: Shows stool burden, esophagitis -C. difficile negative, GI pathogen positive for norovirus -Was treated with IV antibiotics, which has been discontinued by ID -Blood cultures: No growth to date,   2-Acute kidney injury: Creatinine baseline 0.9 presented with a creatinine of 1.3 in the setting of hypovolemia.   Treated with IV fluids  3-Nausea, vomiting -Suspect related to norovirus -Continue with  IV Protonix, as needed Zofran Report some improvement.  Adde Magic Mouth wash.  Tolerating diet.   4-Orthostatic Hypotension, recurrent Syncope: Received IV fluids, and IV bolus.  Cosyntropin test rule out adrenal insufficiency, cortisol at 19--21  Started  Low dose midodrine. Repeat  orthostatic after.  Hold Metoprolol. Cardiology consulted. Has history of PVC Ted hose TSH elevated, contributing. Increase synthroid.   Hypokalemia; Replaced.   Hypomagnesemia; Replaced.  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  ID recommend Repeat CT Lumbar spine. CT spine L 2 loose screw, T 12 compression fracture. Discussed CT finding with Neurosurgery Dr Merdis Delay, who thinks CT funding are chronic. He doesn't see significant change for infection.   Estimated body mass index is 29.47 kg/m as calculated from the following:   Height as of this encounter: 5' 7.5" (1.715 m).   Weight as of this encounter: 86.6 kg.   DVT prophylaxis: Lovenox Code Status: Full code Family Communication: Care discussed with patient.  Disposition Plan:  Status is: Inpatient Remains inpatient appropriate because: management of nausea, dehydration.     Consultants:  ID  Procedures:  None  Antimicrobials:    Subjective: She is eating a little more. No vomiting . She is feeling better.  Nausea better.  No diarrhea.   Objective: Vitals:   06/23/22 1316 06/23/22 2040 06/24/22 0354 06/24/22 1152  BP: 127/80 (!) 125/94 97/65 119/71  Pulse: 86 69 64 67  Resp: '20 16 16 18  '$ Temp: 98 F (36.7 C) 97.7 F (36.5 C) (!) 97.5 F (36.4 C) 97.7 F (36.5 C)  TempSrc:  Oral Oral Oral  SpO2: 92% 95% 91% 100%  Weight:      Height:        Intake/Output Summary (Last 24 hours) at 06/24/2022 1328 Last data filed at 06/24/2022 1157 Gross per 24 hour  Intake 690 ml  Output 1200 ml  Net -510 ml    Filed Weights   06/20/22 1111 06/20/22 2039  Weight: 82.6 kg 86.6 kg    Examination:  General exam: NAD Respiratory system: CTA Cardiovascular system: S 1, S 2 RRR Gastrointestinal system: BS present, soft, nt Central nervous system: Alert, follows command Extremities: no edema   Data Reviewed: I have  personally reviewed following labs and imaging studies  CBC: Recent Labs  Lab 06/20/22 1113 06/21/22 0051 06/22/22 0434 06/23/22 0800  WBC 11.0* 6.1 5.0 4.4  NEUTROABS 8.3*  --   --   --   HGB 16.2* 12.0 11.2* 12.0  HCT 48.5* 37.2 35.6* 39.4  MCV 93.8 96.6 97.8 103.4*  PLT 272 169 140* 144*    Basic Metabolic Panel: Recent Labs  Lab 06/20/22 1113 06/20/22 1536 06/21/22 0051 06/22/22 0434 06/23/22 0800  NA 143  --  136 141 138  K 3.2*  --  2.7* 3.8 3.9  CL 97*  --  101 112* 110  CO2 31  --  '28 25 22  '$ GLUCOSE 132*  --  78 83 77  BUN 23  --  '18 12 10  '$ CREATININE 1.30*  --  1.08* 0.87 0.93  CALCIUM 11.6*  --  8.9 9.0 8.9  MG  --  1.4* 1.8 1.7  --   PHOS  --   --   --  2.8  --     GFR: Estimated Creatinine Clearance: 59.7 mL/min (by C-G formula based on SCr of 0.93 mg/dL). Liver Function Tests: Recent Labs  Lab 06/20/22 1113 06/21/22 0051  AST 20 18  ALT 13 14  ALKPHOS 84 67  BILITOT 0.8 0.8  PROT 7.3 5.1*  ALBUMIN 4.2 2.8*    No results for input(s): "LIPASE", "AMYLASE" in the last 168 hours. No results for input(s): "AMMONIA" in the last 168 hours. Coagulation Profile: Recent Labs  Lab 06/20/22 1113 06/21/22 0051  INR 0.9 1.0    Cardiac Enzymes: No results for input(s): "CKTOTAL", "CKMB", "CKMBINDEX", "TROPONINI" in the last 168 hours. BNP (last 3 results) No results for input(s): "PROBNP" in the last 8760 hours. HbA1C: No results for input(s): "HGBA1C" in the last 72 hours. CBG: Recent Labs  Lab 06/23/22 2036 06/23/22 2338 06/24/22 0350 06/24/22 0737 06/24/22 1146  GLUCAP 96 93 98 92 98    Lipid Profile: No results for input(s): "CHOL", "HDL", "LDLCALC", "TRIG", "CHOLHDL", "LDLDIRECT" in the last 72 hours. Thyroid Function Tests: Recent Labs    06/24/22 0446  TSH 20.147*   Anemia Panel: No results for input(s): "VITAMINB12", "FOLATE", "FERRITIN", "TIBC", "IRON", "RETICCTPCT" in the last 72 hours. Sepsis Labs: Recent Labs  Lab  06/20/22 1113 06/20/22 1343 06/20/22 1536 06/20/22 1730 06/21/22 0051  PROCALCITON  --   --   --   --  <0.10  LATICACIDVEN 2.9* 2.8* 2.6* 1.9  --      Recent Results (from the past 240 hour(s))  Culture, blood (Routine x 2)     Status: None (Preliminary result)   Collection Time: 06/20/22 11:18 AM   Specimen: Right Antecubital; Blood  Result Value Ref Range Status   Specimen Description   Final    RIGHT ANTECUBITAL BLOOD Performed at [facility] [address], [place], [place] [address]    Special Requests   Final    BOTTLES DRAWN AEROBIC AND ANAEROBIC Blood Culture adequate volume Performed at [facility], [address] [place], [place] [address]    Culture   Final  NO GROWTH 4 DAYS Performed at [facility], [place] [address] [place], [place] [address]    Report Status PENDING  Incomplete  Culture, blood (Routine x 2)     Status: None (Preliminary result)   Collection Time: 06/20/22 12:20 PM   Specimen: BLOOD RIGHT HAND  Result Value Ref Range Status   Specimen Description   Final    BLOOD RIGHT HAND Performed at [facility], [address], [place], [place] [address]    Special Requests   Final    BOTTLES DRAWN AEROBIC AND ANAEROBIC Blood Culture adequate volume Performed at [facility], [address], [place], [place] [address]    Culture   Final    NO GROWTH 4 DAYS Performed at [facility], [place] [address], [place], [place] [address]    Report Status PENDING  Incomplete  Resp panel by RT-PCR (RSV, Flu A&B, Covid) Anterior Nasal Swab     Status: None   Collection Time: 06/20/22  1:43 PM   Specimen: Anterior Nasal Swab  Result Value Ref Range Status   SARS Coronavirus 2 by RT PCR NEGATIVE NEGATIVE Final    Comment: (NOTE) SARS-CoV-2 target nucleic acids are NOT DETECTED.  The SARS-CoV-2 RNA is generally detectable in upper respiratory specimens during the acute phase of  infection. The lowest concentration of SARS-CoV-2 viral copies this assay can detect is 138 copies/mL. A negative result does not preclude SARS-Cov-2 infection and should not be used as the sole basis for treatment or other patient management decisions. A negative result may occur with  improper specimen collection/handling, submission of specimen other than nasopharyngeal swab, presence of viral mutation(s) within the areas targeted by this assay, and inadequate number of viral copies(<138 copies/mL). A negative result must be combined with clinical observations, patient history, and epidemiological information. The expected result is Negative.  Fact Sheet for Patients:  EntrepreneurPulse.com.au  Fact Sheet for Healthcare Providers:  IncredibleEmployment.be  This test is no t yet approved or cleared by the [place] FDA and  has been authorized for detection and/or diagnosis of SARS-CoV-2 by FDA under an Emergency Use Authorization (EUA). This EUA will remain  in effect (meaning this test can be used) for the duration of the COVID-19 declaration under Section 564(b)(1) of the Act, 21 U.S.C.section 360bbb-3(b)(1), unless the authorization is terminated  or revoked sooner.       Influenza A by PCR NEGATIVE NEGATIVE Final   Influenza B by PCR NEGATIVE NEGATIVE Final    Comment: (NOTE) The Xpert Xpress SARS-CoV-2/FLU/RSV plus assay is intended as an aid in the diagnosis of influenza from Nasopharyngeal swab specimens and should not be used as a sole basis for treatment. Nasal washings and aspirates are unacceptable for Xpert Xpress SARS-CoV-2/FLU/RSV testing.  Fact Sheet for Patients: EntrepreneurPulse.com.au  Fact Sheet for Healthcare Providers: IncredibleEmployment.be  This test is not yet approved or cleared by the [place] FDA and has been authorized for detection and/or diagnosis of SARS-CoV-2  by FDA under an Emergency Use Authorization (EUA). This EUA will remain in effect (meaning this test can be used) for the duration of the COVID-19 declaration under Section 564(b)(1) of the Act, 21 U.S.C. section 360bbb-3(b)(1), unless the authorization is terminated or revoked.     Resp Syncytial Virus by PCR NEGATIVE NEGATIVE Final    Comment: (NOTE) Fact Sheet for Patients: EntrepreneurPulse.com.au  Fact Sheet for Healthcare Providers: IncredibleEmployment.be  This test is not yet approved or cleared by the [place]  States FDA and has been authorized for detection and/or diagnosis of SARS-CoV-2 by FDA under an Emergency Use Authorization (EUA). This EUA will remain in effect (meaning this test can be used) for the duration of the COVID-19 declaration under Section 564(b)(1) of the Act, 21 U.S.C. section 360bbb-3(b)(1), unless the authorization is terminated or revoked.  Performed at [facility], [place], [place] [address]   C Difficile Quick Screen w PCR reflex     Status: None   Collection Time: 06/21/22  9:09 AM  Result Value Ref Range Status   C Diff antigen NEGATIVE NEGATIVE Final   C Diff toxin NEGATIVE NEGATIVE Final   C Diff interpretation No C. difficile detected.  Final    Comment: Performed at [facility], [place] [address], [place], [place] [address]  Gastrointestinal Panel by PCR , Stool     Status: Abnormal   Collection Time: 06/21/22  9:09 AM  Result Value Ref Range Status   Campylobacter species NOT DETECTED NOT DETECTED Final   Plesimonas shigelloides NOT DETECTED NOT DETECTED Final   Salmonella species NOT DETECTED NOT DETECTED Final   Yersinia enterocolitica NOT DETECTED NOT DETECTED Final   Vibrio species NOT DETECTED NOT DETECTED Final   Vibrio cholerae NOT DETECTED NOT DETECTED Final   Enteroaggregative E coli (EAEC) NOT DETECTED NOT DETECTED Final    Enteropathogenic E coli (EPEC) NOT DETECTED NOT DETECTED Final   Enterotoxigenic E coli (ETEC) NOT DETECTED NOT DETECTED Final   Shiga like toxin producing E coli (STEC) NOT DETECTED NOT DETECTED Final   Shigella/Enteroinvasive E coli (EIEC) NOT DETECTED NOT DETECTED Final   Cryptosporidium NOT DETECTED NOT DETECTED Final   Cyclospora cayetanensis NOT DETECTED NOT DETECTED Final   Entamoeba histolytica NOT DETECTED NOT DETECTED Final   Giardia lamblia NOT DETECTED NOT DETECTED Final   Adenovirus F40/41 NOT DETECTED NOT DETECTED Final   Astrovirus NOT DETECTED NOT DETECTED Final   Norovirus GI/GII DETECTED (A) NOT DETECTED Final    Comment: RESULT CALLED TO, READ BACK BY AND VERIFIED WITH: Margarita Mail RN '@[id number]'$  06/22/22 ASW    Rotavirus A NOT DETECTED NOT DETECTED Final   Sapovirus (I, II, IV, and V) NOT DETECTED NOT DETECTED Final    Comment: Performed at [facility], [address], [place], [place] [address]         Radiology Studies: CT LUMBAR SPINE W CONTRAST  Result Date: 06/23/2022 CLINICAL DATA:  Chronic low back pain with sepsis EXAM: CT LUMBAR SPINE WITH CONTRAST TECHNIQUE: Multidetector CT imaging of the lumbar spine was performed with intravenous contrast administration. RADIATION DOSE REDUCTION: This exam was performed according to the departmental dose-optimization program which includes automated exposure control, adjustment of the mA and/or kV according to patient size and/or use of iterative reconstruction technique. CONTRAST:  64m OMNIPAQUE IOHEXOL 300 MG/ML  SOLN COMPARISON:  No prior dedicated CT of the lumbar spine, correlation is made with 06/20/2022 CT abdomen pelvis and 08/20/2021 MRI lumbar spine FINDINGS: Segmentation: 5 lumbar type vertebral bodies. Alignment: 5 mm retrolisthesis of L1 on L2, unchanged. 5 mm anterolisthesis of L2 on L3, unchanged. Vertebrae: Redemonstrated compression deformity of T12, with overall unchanged vertebral body height loss compared  to the 08/20/2021 MRI, when accounting for differences in technique. Status post posterior fusion and decompression L1-S1, with significant lucency about the bilateral L1 screws (series 3, image 45). No other perihardware lucency. Beam hardening artifact from the hardware limits evaluation of these levels. Paraspinal and  other soft tissues: Multiple renal cysts, for which no follow-up is currently indicated. Aortic atherosclerosis. Disc levels: T12-L1: Mild disc bulge. Mild facet arthropathy. No spinal canal stenosis or neural foraminal narrowing. L1-L2: Retrolisthesis. Status post posterior fusion. Moderate facet arthropathy. No spinal canal stenosis. Moderate bilateral neural foraminal narrowing. L2-L3: Grade 1 anterolisthesis. Status post fusion and decompression. No spinal canal stenosis or neural foraminal narrowing. L3-L4: Status post fusion and decompression. Significant beam hardening artifact limits evaluation of this level. Right eccentric osteophyte formation likely narrows the right lateral recess. No definite spinal canal stenosis. Mild right neural foraminal narrowing. L4-L5: Status post fusion and decompression. Significant beam hardening artifact limits evaluation of this level. No definite spinal canal stenosis or neural foraminal narrowing. L5-S1: Status post fusion and decompression. Significant beam hardening artifact limits evaluation of this level. No spinal canal stenosis or neural foraminal narrowing. IMPRESSION: 1. Status post posterior fusion and decompression L1-S1, with significant lucency about the bilateral L1 screws, concerning for loosening. Beam hardening artifact from the hardware limits evaluation of these levels, however no definite spinal canal stenosis is seen. 2. Redemonstrated compression deformity of T12, with likely similar vertebral body height loss compared to the 08/20/2021 MRI, when accounting for differences in technique. 3. Right eccentric osteophyte formation at L3-L4  likely narrows the right lateral recess. Mild right neural foraminal narrowing. 4. Moderate bilateral neural foraminal narrowing at L1-L2. 5. Aortic atherosclerosis. Aortic Atherosclerosis (ICD10-I70.0). Electronically Signed   By: Merilyn Baba M.D.   On: 06/23/2022 20:19        Scheduled Meds:  allopurinol  300 mg Oral Daily   amitriptyline  100 mg Oral QHS   atorvastatin  80 mg Oral Daily   cefadroxil  500 mg Oral BID   DULoxetine  60 mg Oral Daily   enoxaparin (LOVENOX) injection  40 mg Subcutaneous Q24H   feeding supplement  237 mL Oral TID BM   insulin aspart  0-9 Units Subcutaneous Q4H   [START ON 06/25/2022] levothyroxine  125 mcg Oral Q0600   midodrine  2.5 mg Oral TID WC   pantoprazole (PROTONIX) IV  40 mg Intravenous Q12H   primidone  50 mg Oral BID   topiramate  50 mg Oral QHS   Continuous Infusions:  sodium chloride Stopped (06/20/22 1800)   dextrose 5% lactated ringers 75 mL/hr at 06/24/22 0645   lactated ringers Stopped (06/22/22 1900)     LOS: 4 days    Time spent: 35 minutes    Bryden Darden A Modesta Sammons, MD [facility]   If 7PM-7AM, please contact night-coverage [email or web address]  06/24/2022, 1:28 PM

## 2022-06-24 NOTE — Progress Notes (Addendum)
Physical Therapy Treatment Patient Details Name: Brandi Stephens MRN: [id number] DOB: Jun 16, 1945 Today's Date: 06/24/2022   History of Present Illness Pt is a 77 year old female admitted 06/20/22 for Severe Sepsis, could be related to norovirus infection, AKI, orthostatic hypotension, N/V.  PMH includes COPD, chronic diastolic CHF, CKD stage II-IIIa, depression, anxiety, gout, hypertension, hyperlipidemia, hypothyroidism, breast cancer in 2017 status post left breast lumpectomy/radiation/antiestrogen therapy, RLS, chronic osteomyelitis of lumbar spine with MSSA on suppressive antibiotics    PT Comments    The patient calling out to amb to BR. Patient reports no dizziness,, did not have time to check BP, patient seated on bed edge. Tolerated  short distance  , 20' x 2 using RW. Continue progressive ambulation.  Patient's family in room.  Recommendations for follow up therapy are one component of a multi-disciplinary discharge planning process, led by the attending physician.  Recommendations may be updated based on patient status, additional functional criteria and insurance authorization.  Follow Up Recommendations  Home health PT     Assistance Recommended at Discharge PRN  Patient can return home with the following Help with stairs or ramp for entrance;A little help with walking and/or transfers;Assistance with cooking/housework;Assist for transportation   Equipment Recommendations  None recommended by PT    Recommendations for Other Services       Precautions / Restrictions Precautions Precautions: Fall Precaution Comments: orthostatic(not symptomatic this visit)     Mobility  Bed Mobility Overal bed mobility: Modified Independent                  Transfers Overall transfer level: Needs assistance Equipment used: Rolling walker (2 wheels) Transfers: Sit to/from Stand Sit to Stand: Supervision           General transfer comment: supervision from  bed and  toilet    Ambulation/Gait Ambulation/Gait assistance: Min guard Gait Distance (Feet): 20 Feet (x 2) Assistive device: Rolling walker (2 wheels) Gait Pattern/deviations: Step-to pattern, Step-through pattern Gait velocity: decr     General Gait Details: ambulated to BR and back, no C/O dizziness   Stairs             Wheelchair Mobility    Modified Rankin (Stroke Patients Only)       Balance Overall balance assessment: History of Falls, Needs assistance   Sitting balance-Leahy Scale: Normal     Standing balance support: Single extremity supported, During functional activity Standing balance-Leahy Scale: Fair                              Cognition Arousal/Alertness: Awake/alert Behavior During Therapy: WFL for tasks assessed/performed Overall Cognitive Status: Within Functional Limits for tasks assessed                                          Exercises      General Comments        Pertinent Vitals/Pain Pain Assessment Pain Assessment: No/denies pain    Home Living                          Prior Function            PT Goals (current goals can now be found in the care plan section) Progress towards PT goals: Progressing toward goals    Frequency  Min 3X/week      PT Plan Current plan remains appropriate    Co-evaluation              AM-PAC PT "6 Clicks" Mobility   Outcome Measure  Help needed turning from your back to your side while in a flat bed without using bedrails?: None Help needed moving from lying on your back to sitting on the side of a flat bed without using bedrails?: None Help needed moving to and from a bed to a chair (including a wheelchair)?: A Little Help needed standing up from a chair using your arms (e.g., wheelchair or bedside chair)?: A Little Help needed to walk in hospital room?: A Little Help needed climbing 3-5 steps with a railing? : A Lot 6 Click Score: 19     End of Session   Activity Tolerance: Patient tolerated treatment well Patient left: in bed;with call bell/phone within reach;with family/visitor present;with bed alarm set Nurse Communication: Mobility status PT Visit Diagnosis: Difficulty in walking, not elsewhere classified (R26.2)     Time: 1500-1520 PT Time Calculation (min) (ACUTE ONLY): 20 min  Charges:  $Gait Training: 8-22 mins                      [place] Office [phone] Weekend pager-[phone]     Claretha Cooper 06/24/2022, 4:18 PM

## 2022-06-25 DIAGNOSIS — R6520 Severe sepsis without septic shock: Secondary | ICD-10-CM | POA: Diagnosis not present

## 2022-06-25 DIAGNOSIS — R55 Syncope and collapse: Secondary | ICD-10-CM | POA: Diagnosis not present

## 2022-06-25 DIAGNOSIS — A419 Sepsis, unspecified organism: Secondary | ICD-10-CM | POA: Diagnosis not present

## 2022-06-25 LAB — CULTURE, BLOOD (ROUTINE X 2)
Culture: NO GROWTH
Culture: NO GROWTH
Special Requests: ADEQUATE
Special Requests: ADEQUATE

## 2022-06-25 LAB — GLUCOSE, CAPILLARY
Glucose-Capillary: 115 mg/dL — ABNORMAL HIGH (ref 70–99)
Glucose-Capillary: 84 mg/dL (ref 70–99)
Glucose-Capillary: 85 mg/dL (ref 70–99)
Glucose-Capillary: 89 mg/dL (ref 70–99)
Glucose-Capillary: 93 mg/dL (ref 70–99)
Glucose-Capillary: 97 mg/dL (ref 70–99)

## 2022-06-25 MED ORDER — MIDODRINE HCL 5 MG PO TABS
2.5000 mg | ORAL_TABLET | Freq: Once | ORAL | Status: AC
  Administered 2022-06-25: 2.5 mg via ORAL
  Filled 2022-06-25: qty 1

## 2022-06-25 MED ORDER — MIDODRINE HCL 5 MG PO TABS
5.0000 mg | ORAL_TABLET | Freq: Three times a day (TID) | ORAL | Status: DC
  Administered 2022-06-25 – 2022-06-26 (×3): 5 mg via ORAL
  Filled 2022-06-25 (×2): qty 1

## 2022-06-25 NOTE — Progress Notes (Signed)
PROGRESS NOTE    Brandi Stephens  [id number] DOB: 11-20-1945 DOA: 06/20/2022 PCP: Carlena Hurl, PA-C   Brief Narrative: 77 year old with past medical history significant for diabetes type 2, hypertension, chronic osteomyelitis of lumbar spine with MSSA on suppressive antibiotics.  She has lost about 100 pounds in the last year as she was a started on Ozempic.  She presented to the ED with 2 weeks history of nausea, vomiting and diarrhea.  She also reports some abdominal discomfort.  She was found to have lactic acidosis, lactic acid up to 2.9.  She was hypotensive, improved with IV fluids.  He was admitted for severe sepsis. GI Pathogen positive for norovirus.  Assessment & Plan:   Principal Problem:   Severe sepsis with acute organ dysfunction (HCC) Active Problems:   AKI (acute kidney injury) ([place])   Essential hypertension   Type II diabetes mellitus with complication ([place])  1-Severe Sepsis, could be related to norovirus infection: resolved.  -Presented with hypotension, tachycardia, tachypnea, lactic acidosis. 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 CT negative for infection.  -CT abdomen and pelvis: Shows stool burden, esophagitis -C. difficile negative, GI pathogen positive for norovirus -Was treated with IV antibiotics, which has been discontinued by ID -Blood cultures: No growth to date,   2-Acute kidney injury: Creatinine baseline 0.9 presented with a creatinine of 1.3 in the setting of hypovolemia.   Treated with IV fluids  3-Nausea, vomiting -Suspect related to norovirus -Continue with  IV Protonix, as needed Zofran Report some improvement.  Adde Magic Mouth wash.  Had an episode of vomiting last night.  Eating breakfast   4-Orthostatic Hypotension, recurrent Syncope: Received IV fluids, and IV bolus.  Cosyntropin test rule out adrenal insufficiency, cortisol at 19--21  Hold  Metoprolol. Cardiology consulted. Has history of PVC Ted hose TSH elevated, contributing. Increase synthroid.  Still having dizziness. Will increase midodrine. She would like to avoid abdominal binder.  Recheck orthostatic vitals after   Hypokalemia; Replaced.   Hypomagnesemia; Replaced.  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  ID recommend Repeat CT Lumbar spine. CT spine L 2 loose screw, T 12 compression fracture. Discussed CT finding with Neurosurgery Dr Merdis Delay, who thinks CT funding are chronic. He doesn't see significant change for infection.   Estimated body mass index is 29.47 kg/m as calculated from the following:   Height as of this encounter: 5' 7.5" (1.715 m).   Weight as of this encounter: 86.6 kg.   DVT prophylaxis: Lovenox Code Status: Full code Family Communication: Care discussed with patient.  Disposition Plan:  Status is: Inpatient Remains inpatient appropriate because: management of nausea, dehydration.     Consultants:  ID  Procedures:  None  Antimicrobials:    Subjective: She had an episode of nausea and vomiting last night. She got dizzy last night while going to bathroom. We discussed dizziness precaution and mobility precaution in setting of orthostatic hypotension.  She was dizzy earlier today, but hasn't received midodrine.    Objective: Vitals:   06/24/22 0354 06/24/22 1152 06/24/22 2000 06/25/22 0500  BP: 97/65 119/71 (!) 142/103 123/85  Pulse: 64 67 97 92  Resp: '16 18 18 18  '$ Temp: (!) 97.5 F (36.4 C) 97.7 F (36.5 C) 98.5 F (36.9 C) 98.2 F (36.8 C)  TempSrc: Oral Oral Oral Oral  SpO2: 91% 100% 95% 96%  Weight:      Height:        Intake/Output Summary (Last 24 hours) at 06/25/2022 1244 Last data filed at 06/25/2022 0949 Gross per 24 hour  Intake 520 ml  Output 2950 ml  Net -2430 ml    Filed Weights   06/20/22 1111 06/20/22 2039   Weight: 82.6 kg 86.6 kg    Examination:  General exam: NAD Respiratory system: CTA Cardiovascular system: S 1, S 2 RRR Gastrointestinal system: BS present, soft, NT Central nervous system: Alert, follows command Extremities: No edema   Data Reviewed: I have personally reviewed following labs and imaging studies  CBC: Recent Labs  Lab 06/20/22 1113 06/21/22 0051 06/22/22 0434 06/23/22 0800  WBC 11.0* 6.1 5.0 4.4  NEUTROABS 8.3*  --   --   --   HGB 16.2* 12.0 11.2* 12.0  HCT 48.5* 37.2 35.6* 39.4  MCV 93.8 96.6 97.8 103.4*  PLT 272 169 140* 144*    Basic Metabolic Panel: Recent Labs  Lab 06/20/22 1113 06/20/22 1536 06/21/22 0051 06/22/22 0434 06/23/22 0800  NA 143  --  136 141 138  K 3.2*  --  2.7* 3.8 3.9  CL 97*  --  101 112* 110  CO2 31  --  '28 25 22  '$ GLUCOSE 132*  --  78 83 77  BUN 23  --  '18 12 10  '$ CREATININE 1.30*  --  1.08* 0.87 0.93  CALCIUM 11.6*  --  8.9 9.0 8.9  MG  --  1.4* 1.8 1.7  --   PHOS  --   --   --  2.8  --     GFR: Estimated Creatinine Clearance: 59.7 mL/min (by C-G formula based on SCr of 0.93 mg/dL). Liver Function Tests: Recent Labs  Lab 06/20/22 1113 06/21/22 0051  AST 20 18  ALT 13 14  ALKPHOS 84 67  BILITOT 0.8 0.8  PROT 7.3 5.1*  ALBUMIN 4.2 2.8*    No results for input(s): "LIPASE", "AMYLASE" in the last 168 hours. No results for input(s): "AMMONIA" in the last 168 hours. Coagulation Profile: Recent Labs  Lab 06/20/22 1113 06/21/22 0051  INR 0.9 1.0    Cardiac Enzymes: No results for input(s): "CKTOTAL", "CKMB", "CKMBINDEX", "TROPONINI" in the last 168 hours. BNP (last 3 results) No results for input(s): "PROBNP" in the last 8760 hours. HbA1C: No results for input(s): "HGBA1C" in the last 72 hours. CBG: Recent Labs  Lab 06/24/22 2014 06/24/22 2325 06/25/22 0457 06/25/22 0803 06/25/22 1153  GLUCAP 90 98 85 93 115*    Lipid Profile: No results for input(s): "CHOL", "HDL", "LDLCALC", "TRIG",  "CHOLHDL", "LDLDIRECT" in the last 72 hours. Thyroid Function Tests: Recent Labs    06/24/22 0446  TSH 20.147*    Anemia Panel: No results for input(s): "VITAMINB12", "FOLATE", "FERRITIN", "TIBC", "IRON", "RETICCTPCT" in the last 72 hours. Sepsis Labs: Recent Labs  Lab 06/20/22 1113 06/20/22 1343 06/20/22 1536 06/20/22 1730 06/21/22 0051  PROCALCITON  --   --   --   --  <0.10  LATICACIDVEN 2.9* 2.8* 2.6* 1.9  --      Recent Results (from the past 240 hour(s))  Culture, blood (Routine x 2)     Status: None   Collection Time: 06/20/22 11:18 AM   Specimen: Right Antecubital; Blood  Result Value Ref Range Status   Specimen Description   Final    RIGHT ANTECUBITAL BLOOD Performed at [facility] [address] [place], [place] [address]  Special Requests   Final    BOTTLES DRAWN AEROBIC AND ANAEROBIC Blood Culture adequate volume Performed at [facility], [address] [place], [place] [address]    Culture   Final    NO GROWTH 5 DAYS Performed at [facility], [place] [address], [place], [place] [address]    Report Status 06/25/2022 FINAL  Final  Culture, blood (Routine x 2)     Status: None   Collection Time: 06/20/22 12:20 PM   Specimen: BLOOD RIGHT HAND  Result Value Ref Range Status   Specimen Description   Final    BLOOD RIGHT HAND Performed at [facility], [address] [place], [place] [address]    Special Requests   Final    BOTTLES DRAWN AEROBIC AND ANAEROBIC Blood Culture adequate volume Performed at [facility], [address] [place], [place] [address]    Culture   Final    NO GROWTH 5 DAYS Performed at [facility], [place] [address], [place], [place] [address]    Report Status 06/25/2022 FINAL  Final  Resp panel by RT-PCR (RSV, Flu A&B, Covid) Anterior Nasal Swab     Status: None   Collection Time: 06/20/22  1:43 PM   Specimen: Anterior Nasal Swab  Result Value Ref  Range Status   SARS Coronavirus 2 by RT PCR NEGATIVE NEGATIVE Final    Comment: (NOTE) SARS-CoV-2 target nucleic acids are NOT DETECTED.  The SARS-CoV-2 RNA is generally detectable in upper respiratory specimens during the acute phase of infection. The lowest concentration of SARS-CoV-2 viral copies this assay can detect is 138 copies/mL. A negative result does not preclude SARS-Cov-2 infection and should not be used as the sole basis for treatment or other patient management decisions. A negative result may occur with  improper specimen collection/handling, submission of specimen other than nasopharyngeal swab, presence of viral mutation(s) within the areas targeted by this assay, and inadequate number of viral copies(<138 copies/mL). A negative result must be combined with clinical observations, patient history, and epidemiological information. The expected result is Negative.  Fact Sheet for Patients:  EntrepreneurPulse.com.au  Fact Sheet for Healthcare Providers:  IncredibleEmployment.be  This test is no t yet approved or cleared by the [place] FDA and  has been authorized for detection and/or diagnosis of SARS-CoV-2 by FDA under an Emergency Use Authorization (EUA). This EUA will remain  in effect (meaning this test can be used) for the duration of the COVID-19 declaration under Section 564(b)(1) of the Act, 21 U.S.C.section 360bbb-3(b)(1), unless the authorization is terminated  or revoked sooner.       Influenza A by PCR NEGATIVE NEGATIVE Final   Influenza B by PCR NEGATIVE NEGATIVE Final    Comment: (NOTE) The Xpert Xpress SARS-CoV-2/FLU/RSV plus assay is intended as an aid in the diagnosis of influenza from Nasopharyngeal swab specimens and should not be used as a sole basis for treatment. Nasal washings and aspirates are unacceptable for Xpert Xpress SARS-CoV-2/FLU/RSV testing.  Fact Sheet for  Patients: EntrepreneurPulse.com.au  Fact Sheet for Healthcare Providers: IncredibleEmployment.be  This test is not yet approved or cleared by the [place] FDA and has been authorized for detection and/or diagnosis of SARS-CoV-2 by FDA under an Emergency Use Authorization (EUA). This EUA will remain in effect (meaning this test can be used) for the duration of the COVID-19 declaration under Section 564(b)(1) of the Act, 21 U.S.C. section 360bbb-3(b)(1), unless the authorization is terminated or revoked.  Resp Syncytial Virus by PCR NEGATIVE NEGATIVE Final    Comment: (NOTE) Fact Sheet for Patients: EntrepreneurPulse.com.au  Fact Sheet for Healthcare Providers: IncredibleEmployment.be  This test is not yet approved or cleared by the [place] FDA and has been authorized for detection and/or diagnosis of SARS-CoV-2 by FDA under an Emergency Use Authorization (EUA). This EUA will remain in effect (meaning this test can be used) for the duration of the COVID-19 declaration under Section 564(b)(1) of the Act, 21 U.S.C. section 360bbb-3(b)(1), unless the authorization is terminated or revoked.  Performed at [facility], [place], [place] [address]   C Difficile Quick Screen w PCR reflex     Status: None   Collection Time: 06/21/22  9:09 AM  Result Value Ref Range Status   C Diff antigen NEGATIVE NEGATIVE Final   C Diff toxin NEGATIVE NEGATIVE Final   C Diff interpretation No C. difficile detected.  Final    Comment: Performed at [facility], [place] [address] [place], [place] [address]  Gastrointestinal Panel by PCR , Stool     Status: Abnormal   Collection Time: 06/21/22  9:09 AM  Result Value Ref Range Status   Campylobacter species NOT DETECTED NOT DETECTED Final   Plesimonas shigelloides NOT DETECTED NOT DETECTED Final   Salmonella species  NOT DETECTED NOT DETECTED Final   Yersinia enterocolitica NOT DETECTED NOT DETECTED Final   Vibrio species NOT DETECTED NOT DETECTED Final   Vibrio cholerae NOT DETECTED NOT DETECTED Final   Enteroaggregative E coli (EAEC) NOT DETECTED NOT DETECTED Final   Enteropathogenic E coli (EPEC) NOT DETECTED NOT DETECTED Final   Enterotoxigenic E coli (ETEC) NOT DETECTED NOT DETECTED Final   Shiga like toxin producing E coli (STEC) NOT DETECTED NOT DETECTED Final   Shigella/Enteroinvasive E coli (EIEC) NOT DETECTED NOT DETECTED Final   Cryptosporidium NOT DETECTED NOT DETECTED Final   Cyclospora cayetanensis NOT DETECTED NOT DETECTED Final   Entamoeba histolytica NOT DETECTED NOT DETECTED Final   Giardia lamblia NOT DETECTED NOT DETECTED Final   Adenovirus F40/41 NOT DETECTED NOT DETECTED Final   Astrovirus NOT DETECTED NOT DETECTED Final   Norovirus GI/GII DETECTED (A) NOT DETECTED Final    Comment: RESULT CALLED TO, READ BACK BY AND VERIFIED WITH: Margarita Mail RN '@[id number]'$  06/22/22 ASW    Rotavirus A NOT DETECTED NOT DETECTED Final   Sapovirus (I, II, IV, and V) NOT DETECTED NOT DETECTED Final    Comment: Performed at [facility], [address] [place], [place] [address]         Radiology Studies: CT LUMBAR SPINE W CONTRAST  Result Date: 06/23/2022 CLINICAL DATA:  Chronic low back pain with sepsis EXAM: CT LUMBAR SPINE WITH CONTRAST TECHNIQUE: Multidetector CT imaging of the lumbar spine was performed with intravenous contrast administration. RADIATION DOSE REDUCTION: This exam was performed according to the departmental dose-optimization program which includes automated exposure control, adjustment of the mA and/or kV according to patient size and/or use of iterative reconstruction technique. CONTRAST:  94m OMNIPAQUE IOHEXOL 300 MG/ML  SOLN COMPARISON:  No prior dedicated CT of the lumbar spine, correlation is made with 06/20/2022 CT abdomen pelvis and 08/20/2021 MRI lumbar spine  FINDINGS: Segmentation: 5 lumbar type vertebral bodies. Alignment: 5 mm retrolisthesis of L1 on L2, unchanged. 5 mm anterolisthesis of L2 on L3, unchanged. Vertebrae: Redemonstrated compression deformity of T12, with overall unchanged vertebral body height loss compared to the 08/20/2021 MRI, when accounting for differences in  technique. Status post posterior fusion and decompression L1-S1, with significant lucency about the bilateral L1 screws (series 3, image 45). No other perihardware lucency. Beam hardening artifact from the hardware limits evaluation of these levels. Paraspinal and other soft tissues: Multiple renal cysts, for which no follow-up is currently indicated. Aortic atherosclerosis. Disc levels: T12-L1: Mild disc bulge. Mild facet arthropathy. No spinal canal stenosis or neural foraminal narrowing. L1-L2: Retrolisthesis. Status post posterior fusion. Moderate facet arthropathy. No spinal canal stenosis. Moderate bilateral neural foraminal narrowing. L2-L3: Grade 1 anterolisthesis. Status post fusion and decompression. No spinal canal stenosis or neural foraminal narrowing. L3-L4: Status post fusion and decompression. Significant beam hardening artifact limits evaluation of this level. Right eccentric osteophyte formation likely narrows the right lateral recess. No definite spinal canal stenosis. Mild right neural foraminal narrowing. L4-L5: Status post fusion and decompression. Significant beam hardening artifact limits evaluation of this level. No definite spinal canal stenosis or neural foraminal narrowing. L5-S1: Status post fusion and decompression. Significant beam hardening artifact limits evaluation of this level. No spinal canal stenosis or neural foraminal narrowing. IMPRESSION: 1. Status post posterior fusion and decompression L1-S1, with significant lucency about the bilateral L1 screws, concerning for loosening. Beam hardening artifact from the hardware limits evaluation of these levels,  however no definite spinal canal stenosis is seen. 2. Redemonstrated compression deformity of T12, with likely similar vertebral body height loss compared to the 08/20/2021 MRI, when accounting for differences in technique. 3. Right eccentric osteophyte formation at L3-L4 likely narrows the right lateral recess. Mild right neural foraminal narrowing. 4. Moderate bilateral neural foraminal narrowing at L1-L2. 5. Aortic atherosclerosis. Aortic Atherosclerosis (ICD10-I70.0). Electronically Signed   By: Merilyn Baba M.D.   On: 06/23/2022 20:19        Scheduled Meds:  allopurinol  300 mg Oral Daily   amitriptyline  100 mg Oral QHS   atorvastatin  80 mg Oral Daily   cefadroxil  500 mg Oral BID   DULoxetine  60 mg Oral Daily   enoxaparin (LOVENOX) injection  40 mg Subcutaneous Q24H   feeding supplement  237 mL Oral TID BM   insulin aspart  0-9 Units Subcutaneous Q4H   levothyroxine  125 mcg Oral Q0600   midodrine  5 mg Oral TID WC   pantoprazole (PROTONIX) IV  40 mg Intravenous Q12H   primidone  50 mg Oral BID   topiramate  50 mg Oral QHS   Continuous Infusions:  sodium chloride Stopped (06/20/22 1800)   dextrose 5% lactated ringers 75 mL/hr at 06/25/22 1239     LOS: 5 days    Time spent: 35 minutes    Eulalah Rupert A Tylen Leverich, MD [facility]   If 7PM-7AM, please contact night-coverage [email or web address]  06/25/2022, 12:44 PM

## 2022-06-25 NOTE — Progress Notes (Signed)
Rounding Note    Patient Name: Brandi Stephens Date of Encounter: 06/25/2022  [place] Cardiologist: Janina Mayo, MD   Subjective   Ongoing dizziness  Inpatient Medications    Scheduled Meds:  allopurinol  300 mg Oral Daily   amitriptyline  100 mg Oral QHS   atorvastatin  80 mg Oral Daily   cefadroxil  500 mg Oral BID   DULoxetine  60 mg Oral Daily   enoxaparin (LOVENOX) injection  40 mg Subcutaneous Q24H   feeding supplement  237 mL Oral TID BM   insulin aspart  0-9 Units Subcutaneous Q4H   levothyroxine  125 mcg Oral Q0600   midodrine  2.5 mg Oral Once   midodrine  5 mg Oral TID WC   pantoprazole (PROTONIX) IV  40 mg Intravenous Q12H   primidone  50 mg Oral BID   topiramate  50 mg Oral QHS   Continuous Infusions:  sodium chloride Stopped (06/20/22 1800)   dextrose 5% lactated ringers 50 mL/hr at 06/24/22 1358   PRN Meds: sodium chloride, bisacodyl, diphenhydrAMINE, HYDROcodone-acetaminophen, magic mouthwash, ondansetron (ZOFRAN) IV, mouth rinse, polyethylene glycol, rOPINIRole   Vital Signs    Vitals:   06/24/22 0354 06/24/22 1152 06/24/22 2000 06/25/22 0500  BP: 97/65 119/71 (!) 142/103 123/85  Pulse: 64 67 97 92  Resp: '16 18 18 18  '$ Temp: (!) 97.5 F (36.4 C) 97.7 F (36.5 C) 98.5 F (36.9 C) 98.2 F (36.8 C)  TempSrc: Oral Oral Oral Oral  SpO2: 91% 100% 95% 96%  Weight:      Height:        Intake/Output Summary (Last 24 hours) at 06/25/2022 0932 Last data filed at 06/25/2022 [id number] Gross per 24 hour  Intake 990 ml  Output 2950 ml  Net -1960 ml      06/20/2022    8:39 PM 06/20/2022   11:11 AM 06/07/2022   11:49 AM  Last 3 Weights  Weight (lbs) 191 lb 182 lb 184 lb  Weight (kg) 86.637 kg 82.555 kg 83.462 kg      Telemetry    SR - Personally Reviewed  ECG    N/a - Personally Reviewed  Physical Exam   GEN: No acute distress.   Neck: No JVD Cardiac: RRR, no murmurs, rubs, or gallops.  Respiratory: Clear to auscultation  bilaterally. GI: Soft, nontender, non-distended  MS: No edema; No deformity. Neuro:  Nonfocal  Psych: Normal affect   Labs    High Sensitivity Troponin:   Recent Labs  Lab 06/20/22 1113 06/20/22 1434  TROPONINIHS 16 13     Chemistry Recent Labs  Lab 06/20/22 1113 06/20/22 1536 06/21/22 0051 06/22/22 0434 06/23/22 0800  NA 143  --  136 141 138  K 3.2*  --  2.7* 3.8 3.9  CL 97*  --  101 112* 110  CO2 31  --  '28 25 22  '$ GLUCOSE 132*  --  78 83 77  BUN 23  --  '18 12 10  '$ CREATININE 1.30*  --  1.08* 0.87 0.93  CALCIUM 11.6*  --  8.9 9.0 8.9  MG  --  1.4* 1.8 1.7  --   PROT 7.3  --  5.1*  --   --   ALBUMIN 4.2  --  2.8*  --   --   AST 20  --  18  --   --   ALT 13  --  14  --   --   ALKPHOS  84  --  67  --   --   BILITOT 0.8  --  0.8  --   --   GFRNONAA 43*  --  54* >60 >60  ANIONGAP 15  --  7 4* 6    Lipids No results for input(s): "CHOL", "TRIG", "HDL", "LABVLDL", "LDLCALC", "CHOLHDL" in the last 168 hours.  Hematology Recent Labs  Lab 06/21/22 0051 06/22/22 0434 06/23/22 0800  WBC 6.1 5.0 4.4  RBC 3.85* 3.64* 3.81*  HGB 12.0 11.2* 12.0  HCT 37.2 35.6* 39.4  MCV 96.6 97.8 103.4*  MCH 31.2 30.8 31.5  MCHC 32.3 31.5 30.5  RDW 14.7 14.6 14.7  PLT 169 140* 144*   Thyroid  Recent Labs  Lab 06/24/22 0446  TSH 20.147*    BNPNo results for input(s): "BNP", "PROBNP" in the last 168 hours.  DDimer No results for input(s): "DDIMER" in the last 168 hours.   Radiology    CT LUMBAR SPINE W CONTRAST  Result Date: 06/23/2022 CLINICAL DATA:  Chronic low back pain with sepsis EXAM: CT LUMBAR SPINE WITH CONTRAST TECHNIQUE: Multidetector CT imaging of the lumbar spine was performed with intravenous contrast administration. RADIATION DOSE REDUCTION: This exam was performed according to the departmental dose-optimization program which includes automated exposure control, adjustment of the mA and/or kV according to patient size and/or use of iterative reconstruction technique.  CONTRAST:  17m OMNIPAQUE IOHEXOL 300 MG/ML  SOLN COMPARISON:  No prior dedicated CT of the lumbar spine, correlation is made with 06/20/2022 CT abdomen pelvis and 08/20/2021 MRI lumbar spine FINDINGS: Segmentation: 5 lumbar type vertebral bodies. Alignment: 5 mm retrolisthesis of L1 on L2, unchanged. 5 mm anterolisthesis of L2 on L3, unchanged. Vertebrae: Redemonstrated compression deformity of T12, with overall unchanged vertebral body height loss compared to the 08/20/2021 MRI, when accounting for differences in technique. Status post posterior fusion and decompression L1-S1, with significant lucency about the bilateral L1 screws (series 3, image 45). No other perihardware lucency. Beam hardening artifact from the hardware limits evaluation of these levels. Paraspinal and other soft tissues: Multiple renal cysts, for which no follow-up is currently indicated. Aortic atherosclerosis. Disc levels: T12-L1: Mild disc bulge. Mild facet arthropathy. No spinal canal stenosis or neural foraminal narrowing. L1-L2: Retrolisthesis. Status post posterior fusion. Moderate facet arthropathy. No spinal canal stenosis. Moderate bilateral neural foraminal narrowing. L2-L3: Grade 1 anterolisthesis. Status post fusion and decompression. No spinal canal stenosis or neural foraminal narrowing. L3-L4: Status post fusion and decompression. Significant beam hardening artifact limits evaluation of this level. Right eccentric osteophyte formation likely narrows the right lateral recess. No definite spinal canal stenosis. Mild right neural foraminal narrowing. L4-L5: Status post fusion and decompression. Significant beam hardening artifact limits evaluation of this level. No definite spinal canal stenosis or neural foraminal narrowing. L5-S1: Status post fusion and decompression. Significant beam hardening artifact limits evaluation of this level. No spinal canal stenosis or neural foraminal narrowing. IMPRESSION: 1. Status post posterior  fusion and decompression L1-S1, with significant lucency about the bilateral L1 screws, concerning for loosening. Beam hardening artifact from the hardware limits evaluation of these levels, however no definite spinal canal stenosis is seen. 2. Redemonstrated compression deformity of T12, with likely similar vertebral body height loss compared to the 08/20/2021 MRI, when accounting for differences in technique. 3. Right eccentric osteophyte formation at L3-L4 likely narrows the right lateral recess. Mild right neural foraminal narrowing. 4. Moderate bilateral neural foraminal narrowing at L1-L2. 5. Aortic atherosclerosis. Aortic Atherosclerosis (ICD10-I70.0). Electronically Signed  By: Merilyn Baba M.D.   On: 06/23/2022 20:19    Cardiac Studies     Patient Profile     Brandi Stephens is a 77 y.o. female with a history of atypical chest pain with normal Myoview in 01/2016 and [id number], chronic diastolic CHF, palpitations with rare PAC and brief SVT noted on monitor in 09/2021, hypertension, hyperlipidemia, prediabetes, hypothyroidism, COPD, chronic osteomyelitis of left spine on suppressive antibiotics, left breast cancer s/p lumpectomy and radiation, and tobacco use who is being seen 06/24/2022 for the evaluation of syncope at the request of Dr. Tyrell Antonio.   Assessment & Plan    1.Syncope/orthostatic hypotension - ongoing outpatient workup, currently undergoing 30 day outpatient monitor - repeat echo pending - orthostatic this admission. Given IVFs, started on midodrine this admission, dose increased from 2.'5mg'$  to 5 mg starting this AM. Increase maintenece IVFs to 54m/hr - volume status complicated by chronic N/V - Per [place], she should not drive for 6 months.   -last orthostatics 3/7 SBP 105-->71 with standing, DBP 61-->39. HR 65-->103  Amitryptiline, cymbalta, requip reported side effects of orthostatic hypotension. She reports long term use and no recent changes in dosing.  - no evidence  adrenal insufficiency, Cosyntropin test rule out adrenal insufficiency, cortisol at 19--21 per primary team   2.Chronic HFpEF - Echo in 01/2021 showed LVEF of 60-65% with mild LVH and grade 1 diastolic dysfunction   - Only on Bumex as needed at home and has not needed this in the past couple of weeks. Continue to hold.   3. Severe sepsis - Presented with hypotension, tachycardia, tachypnea, lactic acidosis  - per primary team For questions or updates, please contact CH[place]fieldPlease consult [email or web address] for contact info under        Signed, BCarlyle Dolly MD  06/25/2022, 9:32 AM

## 2022-06-26 DIAGNOSIS — R55 Syncope and collapse: Secondary | ICD-10-CM | POA: Diagnosis not present

## 2022-06-26 DIAGNOSIS — I951 Orthostatic hypotension: Secondary | ICD-10-CM | POA: Diagnosis not present

## 2022-06-26 LAB — BASIC METABOLIC PANEL
Anion gap: 8 (ref 5–15)
BUN: 5 mg/dL — ABNORMAL LOW (ref 8–23)
CO2: 22 mmol/L (ref 22–32)
Calcium: 8.9 mg/dL (ref 8.9–10.3)
Chloride: 109 mmol/L (ref 98–111)
Creatinine, Ser: 0.83 mg/dL (ref 0.44–1.00)
GFR, Estimated: >60 mL/min (ref >60)
Glucose, Bld: 79 mg/dL (ref 70–99)
Potassium: 3.6 mmol/L (ref 3.5–5.1)
Sodium: 139 mmol/L (ref 135–145)

## 2022-06-26 LAB — GLUCOSE, CAPILLARY
Glucose-Capillary: 86 mg/dL (ref 70–99)
Glucose-Capillary: 78 mg/dL (ref 70–99)
Glucose-Capillary: 190 mg/dL — ABNORMAL HIGH (ref 70–99)
Glucose-Capillary: 68 mg/dL — ABNORMAL LOW (ref 70–99)
Glucose-Capillary: 119 mg/dL — ABNORMAL HIGH (ref 70–99)
Glucose-Capillary: 99 mg/dL (ref 70–99)

## 2022-06-26 MED ORDER — PANTOPRAZOLE SODIUM 40 MG PO TBEC
40.0000 mg | DELAYED_RELEASE_TABLET | Freq: Two times a day (BID) | ORAL | Status: DC
  Administered 2022-06-26 – 2022-06-29 (×6): 40 mg via ORAL
  Filled 2022-06-26 (×6): qty 1

## 2022-06-26 MED ORDER — MIDODRINE HCL 5 MG PO TABS
10.0000 mg | ORAL_TABLET | Freq: Three times a day (TID) | ORAL | Status: DC
  Administered 2022-06-26 – 2022-06-29 (×10): 10 mg via ORAL
  Filled 2022-06-26 (×12): qty 2

## 2022-06-26 NOTE — Progress Notes (Signed)
PHARMACIST - PHYSICIAN COMMUNICATION  DR:   Tyrell Antonio CONCERNING: IV to Oral Route Change Policy  RECOMMENDATION: This patient is receiving Protonix by the intravenous route.  Based on criteria approved by the Pharmacy and Therapeutics Committee, the intravenous medication(s) is/are being converted to the equivalent oral dose form(s).   DESCRIPTION: These criteria include: The patient is eating (either orally or via tube) and/or has been taking other orally administered medications for a least 24 hours The patient has no evidence of active gastrointestinal bleeding or impaired GI absorption (gastrectomy, short bowel, patient on TNA or NPO).  If you have questions about this conversion, please contact the Pharmacy Department  '[]'$   [phone] )  [facility] PharmD, BCPS [facility] main pharmacy [phone] 06/26/2022 12:16 PM

## 2022-06-26 NOTE — Progress Notes (Signed)
PROGRESS NOTE    Brandi Stephens  [id number] DOB: 01/21/1946 DOA: 06/20/2022 PCP: Carlena Hurl, PA-C   Brief Narrative: 77 year old with past medical history significant for diabetes type 2, hypertension, chronic osteomyelitis of lumbar spine with MSSA on suppressive antibiotics.  She has lost about 100 pounds in the last year as she was a started on Ozempic.  She presented to the ED with 2 weeks history of nausea, vomiting and diarrhea.  She also reports some abdominal discomfort.  She was found to have lactic acidosis, lactic acid up to 2.9.  She was hypotensive, improved with IV fluids.  He was admitted for severe sepsis. GI Pathogen positive for norovirus.  Assessment & Plan:   Principal Problem:   Severe sepsis with acute organ dysfunction (HCC) Active Problems:   AKI (acute kidney injury) ([place])   Essential hypertension   Type II diabetes mellitus with complication ([place])  1-Severe Sepsis, could be related to norovirus infection: resolved.  -Presented with hypotension, tachycardia, tachypnea, lactic acidosis. 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 CT negative for infection.  -CT abdomen and pelvis: Shows stool burden, esophagitis -C. difficile negative, GI pathogen positive for norovirus -Was treated with IV antibiotics, which has been discontinued by ID -Blood cultures: No growth to date,   2-Acute kidney injury: Creatinine baseline 0.9 presented with a creatinine of 1.3 in the setting of hypovolemia.   Treated with IV fluids  3-Nausea, vomiting -Suspect related to norovirus -Continue with  IV Protonix, as needed Zofran Report some improvement.  Continue with Magic Mouth wash.  She is trying to eat more.   4-Orthostatic Hypotension, recurrent Syncope: Received IV fluids, and IV bolus.  Cosyntropin test rule out adrenal insufficiency, cortisol at 19--21  Hold Metoprolol. Cardiology  consulted. Has history of PVC Ted hose, will add abdominal binder.  TSH elevated, contributing. Increase synthroid.  Plan to increase midodrine to 10 mg TID    Hypokalemia; Replaced.   Hypomagnesemia; Replaced.  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  ID recommend Repeat CT Lumbar spine. CT spine L 2 loose screw, T 12 compression fracture. Discussed CT finding with Neurosurgery Dr Merdis Delay, who thinks CT funding are chronic. He doesn't see significant change for infection.   Estimated body mass index is 29.47 kg/m as calculated from the following:   Height as of this encounter: 5' 7.5" (1.715 m).   Weight as of this encounter: 86.6 kg.   DVT prophylaxis: Lovenox Code Status: Full code Family Communication: Care discussed with patient.  Disposition Plan:  Status is: Inpatient Remains inpatient appropriate because: management of nausea, dehydration.     Consultants:  ID  Procedures:  None  Antimicrobials:    Subjective: She is still having dizziness.  She is trying to eat. No diarrhea.     Objective: Vitals:   06/25/22 1246 06/25/22 1900 06/26/22 0427 06/26/22 1145  BP: 133/84  107/63 119/77  Pulse: 91  88 94  Resp: 18 20 (!) 22   Temp: 98.1 F (36.7 C) 98.4 F (36.9 C) 97.6 F (36.4 C)   TempSrc: Oral Oral Oral   SpO2: 100%  93%   Weight:      Height:        Intake/Output Summary (Last 24 hours) at 06/26/2022 1511 Last data filed at 06/26/2022 1229 Gross per 24 hour  Intake 360 ml  Output 1200 ml  Net -840 ml    Filed Weights   06/20/22 1111 06/20/22 2039  Weight: 82.6 kg 86.6 kg    Examination:  General exam: NAD Respiratory system: CTA Cardiovascular system: S 1, S 2 RRR Gastrointestinal system: BS present, soft nt Central nervous system: alert, follows command Extremities: mild edema right hand   Data Reviewed: I have personally reviewed following  labs and imaging studies  CBC: Recent Labs  Lab 06/20/22 1113 06/21/22 0051 06/22/22 0434 06/23/22 0800  WBC 11.0* 6.1 5.0 4.4  NEUTROABS 8.3*  --   --   --   HGB 16.2* 12.0 11.2* 12.0  HCT 48.5* 37.2 35.6* 39.4  MCV 93.8 96.6 97.8 103.4*  PLT 272 169 140* 144*    Basic Metabolic Panel: Recent Labs  Lab 06/20/22 1113 06/20/22 1536 06/21/22 0051 06/22/22 0434 06/23/22 0800 06/25/22 0758  NA 143  --  136 141 138 139  K 3.2*  --  2.7* 3.8 3.9 3.6  CL 97*  --  101 112* 110 109  CO2 31  --  '28 25 22 22  '$ GLUCOSE 132*  --  78 83 77 79  BUN 23  --  '18 12 10 '$ 5*  CREATININE 1.30*  --  1.08* 0.87 0.93 0.83  CALCIUM 11.6*  --  8.9 9.0 8.9 8.9  MG  --  1.4* 1.8 1.7  --   --   PHOS  --   --   --  2.8  --   --     GFR: Estimated Creatinine Clearance: 66.8 mL/min (by C-G formula based on SCr of 0.83 mg/dL). Liver Function Tests: Recent Labs  Lab 06/20/22 1113 06/21/22 0051  AST 20 18  ALT 13 14  ALKPHOS 84 67  BILITOT 0.8 0.8  PROT 7.3 5.1*  ALBUMIN 4.2 2.8*    No results for input(s): "LIPASE", "AMYLASE" in the last 168 hours. No results for input(s): "AMMONIA" in the last 168 hours. Coagulation Profile: Recent Labs  Lab 06/20/22 1113 06/21/22 0051  INR 0.9 1.0    Cardiac Enzymes: No results for input(s): "CKTOTAL", "CKMB", "CKMBINDEX", "TROPONINI" in the last 168 hours. BNP (last 3 results) No results for input(s): "PROBNP" in the last 8760 hours. HbA1C: No results for input(s): "HGBA1C" in the last 72 hours. CBG: Recent Labs  Lab 06/25/22 2352 06/26/22 0418 06/26/22 0742 06/26/22 1134 06/26/22 1200  GLUCAP 84 86 78 68* 190*    Lipid Profile: No results for input(s): "CHOL", "HDL", "LDLCALC", "TRIG", "CHOLHDL", "LDLDIRECT" in the last 72 hours. Thyroid Function Tests: Recent Labs    06/24/22 0446  TSH 20.147*    Anemia Panel: No results for input(s): "VITAMINB12", "FOLATE", "FERRITIN", "TIBC", "IRON", "RETICCTPCT" in the last 72  hours. Sepsis Labs: Recent Labs  Lab 06/20/22 1113 06/20/22 1343 06/20/22 1536 06/20/22 1730 06/21/22 0051  PROCALCITON  --   --   --   --  <0.10  LATICACIDVEN 2.9* 2.8* 2.6* 1.9  --      Recent Results (from the past 240 hour(s))  Culture, blood (Routine x 2)     Status: None   Collection Time: 06/20/22 11:18 AM   Specimen: Right Antecubital; Blood  Result Value Ref Range Status   Specimen Description   Final    RIGHT ANTECUBITAL BLOOD Performed at [facility] [address] [place], [place] [address]    Special Requests   Final    BOTTLES DRAWN AEROBIC AND ANAEROBIC Blood Culture adequate  volume Performed at [facility], [address] [place], [place] [address]    Culture   Final    NO GROWTH 5 DAYS Performed at [facility], [place] [address] [place], [place] [address]    Report Status 06/25/2022 FINAL  Final  Culture, blood (Routine x 2)     Status: None   Collection Time: 06/20/22 12:20 PM   Specimen: BLOOD RIGHT HAND  Result Value Ref Range Status   Specimen Description   Final    BLOOD RIGHT HAND Performed at [facility], [address], [place], [place] [address]    Special Requests   Final    BOTTLES DRAWN AEROBIC AND ANAEROBIC Blood Culture adequate volume Performed at [facility], [address], [place], [place] [address]    Culture   Final    NO GROWTH 5 DAYS Performed at [facility], [place] [address], [place], [place] [address]    Report Status 06/25/2022 FINAL  Final  Resp panel by RT-PCR (RSV, Flu A&B, Covid) Anterior Nasal Swab     Status: None   Collection Time: 06/20/22  1:43 PM   Specimen: Anterior Nasal Swab  Result Value Ref Range Status   SARS Coronavirus 2 by RT PCR NEGATIVE NEGATIVE Final    Comment: (NOTE) SARS-CoV-2 target nucleic acids are NOT DETECTED.  The SARS-CoV-2 RNA is generally detectable in upper respiratory specimens during the acute phase  of infection. The lowest concentration of SARS-CoV-2 viral copies this assay can detect is 138 copies/mL. A negative result does not preclude SARS-Cov-2 infection and should not be used as the sole basis for treatment or other patient management decisions. A negative result may occur with  improper specimen collection/handling, submission of specimen other than nasopharyngeal swab, presence of viral mutation(s) within the areas targeted by this assay, and inadequate number of viral copies(<138 copies/mL). A negative result must be combined with clinical observations, patient history, and epidemiological information. The expected result is Negative.  Fact Sheet for Patients:  EntrepreneurPulse.com.au  Fact Sheet for Healthcare Providers:  IncredibleEmployment.be  This test is no t yet approved or cleared by the [place] FDA and  has been authorized for detection and/or diagnosis of SARS-CoV-2 by FDA under an Emergency Use Authorization (EUA). This EUA will remain  in effect (meaning this test can be used) for the duration of the COVID-19 declaration under Section 564(b)(1) of the Act, 21 U.S.C.section 360bbb-3(b)(1), unless the authorization is terminated  or revoked sooner.       Influenza A by PCR NEGATIVE NEGATIVE Final   Influenza B by PCR NEGATIVE NEGATIVE Final    Comment: (NOTE) The Xpert Xpress SARS-CoV-2/FLU/RSV plus assay is intended as an aid in the diagnosis of influenza from Nasopharyngeal swab specimens and should not be used as a sole basis for treatment. Nasal washings and aspirates are unacceptable for Xpert Xpress SARS-CoV-2/FLU/RSV testing.  Fact Sheet for Patients: EntrepreneurPulse.com.au  Fact Sheet for Healthcare Providers: IncredibleEmployment.be  This test is not yet approved or cleared by the [place] FDA and has been authorized for detection and/or diagnosis of  SARS-CoV-2 by FDA under an Emergency Use Authorization (EUA). This EUA will remain in effect (meaning this test can be used) for the duration of the COVID-19 declaration under Section 564(b)(1) of the Act, 21 U.S.C. section 360bbb-3(b)(1), unless the authorization is terminated or revoked.     Resp Syncytial Virus by PCR NEGATIVE NEGATIVE Final    Comment: (NOTE) Fact  Sheet for Patients: EntrepreneurPulse.com.au  Fact Sheet for Healthcare Providers: IncredibleEmployment.be  This test is not yet approved or cleared by the [place] FDA and has been authorized for detection and/or diagnosis of SARS-CoV-2 by FDA under an Emergency Use Authorization (EUA). This EUA will remain in effect (meaning this test can be used) for the duration of the COVID-19 declaration under Section 564(b)(1) of the Act, 21 U.S.C. section 360bbb-3(b)(1), unless the authorization is terminated or revoked.  Performed at [facility], [place], [place] [address]   C Difficile Quick Screen w PCR reflex     Status: None   Collection Time: 06/21/22  9:09 AM  Result Value Ref Range Status   C Diff antigen NEGATIVE NEGATIVE Final   C Diff toxin NEGATIVE NEGATIVE Final   C Diff interpretation No C. difficile detected.  Final    Comment: Performed at [facility], [place] [address], [place], [place] [address]  Gastrointestinal Panel by PCR , Stool     Status: Abnormal   Collection Time: 06/21/22  9:09 AM  Result Value Ref Range Status   Campylobacter species NOT DETECTED NOT DETECTED Final   Plesimonas shigelloides NOT DETECTED NOT DETECTED Final   Salmonella species NOT DETECTED NOT DETECTED Final   Yersinia enterocolitica NOT DETECTED NOT DETECTED Final   Vibrio species NOT DETECTED NOT DETECTED Final   Vibrio cholerae NOT DETECTED NOT DETECTED Final   Enteroaggregative E coli (EAEC) NOT DETECTED NOT DETECTED Final    Enteropathogenic E coli (EPEC) NOT DETECTED NOT DETECTED Final   Enterotoxigenic E coli (ETEC) NOT DETECTED NOT DETECTED Final   Shiga like toxin producing E coli (STEC) NOT DETECTED NOT DETECTED Final   Shigella/Enteroinvasive E coli (EIEC) NOT DETECTED NOT DETECTED Final   Cryptosporidium NOT DETECTED NOT DETECTED Final   Cyclospora cayetanensis NOT DETECTED NOT DETECTED Final   Entamoeba histolytica NOT DETECTED NOT DETECTED Final   Giardia lamblia NOT DETECTED NOT DETECTED Final   Adenovirus F40/41 NOT DETECTED NOT DETECTED Final   Astrovirus NOT DETECTED NOT DETECTED Final   Norovirus GI/GII DETECTED (A) NOT DETECTED Final    Comment: RESULT CALLED TO, READ BACK BY AND VERIFIED WITH: Margarita Mail RN '@[id number]'$  06/22/22 ASW    Rotavirus A NOT DETECTED NOT DETECTED Final   Sapovirus (I, II, IV, and V) NOT DETECTED NOT DETECTED Final    Comment: Performed at [facility], [address], [place], [place] [address]         Radiology Studies: No results found.      Scheduled Meds:  allopurinol  300 mg Oral Daily   amitriptyline  100 mg Oral QHS   atorvastatin  80 mg Oral Daily   cefadroxil  500 mg Oral BID   DULoxetine  60 mg Oral Daily   enoxaparin (LOVENOX) injection  40 mg Subcutaneous Q24H   feeding supplement  237 mL Oral TID BM   insulin aspart  0-9 Units Subcutaneous Q4H   levothyroxine  125 mcg Oral Q0600   midodrine  10 mg Oral TID WC   pantoprazole  40 mg Oral BID   primidone  50 mg Oral BID   topiramate  50 mg Oral QHS   Continuous Infusions:  sodium chloride Stopped (06/20/22 1800)   dextrose 5% lactated ringers 75 mL/hr at 06/25/22 1724     LOS: 6 days    Time spent: 35 minutes    Elmarie Shiley, MD [facility]   If  7PM-7AM, please contact night-coverage [email or web address]  06/26/2022, 3:11 PM

## 2022-06-26 NOTE — Progress Notes (Signed)
Rounding Note    Patient Name: Brandi Stephens Date of Encounter: 06/26/2022  [place] Cardiologist: Janina Mayo, MD   Subjective   Ongoing dizziness  Inpatient Medications    Scheduled Meds:  allopurinol  300 mg Oral Daily   amitriptyline  100 mg Oral QHS   atorvastatin  80 mg Oral Daily   cefadroxil  500 mg Oral BID   DULoxetine  60 mg Oral Daily   enoxaparin (LOVENOX) injection  40 mg Subcutaneous Q24H   feeding supplement  237 mL Oral TID BM   insulin aspart  0-9 Units Subcutaneous Q4H   levothyroxine  125 mcg Oral Q0600   midodrine  5 mg Oral TID WC   pantoprazole (PROTONIX) IV  40 mg Intravenous Q12H   primidone  50 mg Oral BID   topiramate  50 mg Oral QHS   Continuous Infusions:  sodium chloride Stopped (06/20/22 1800)   dextrose 5% lactated ringers 75 mL/hr at 06/25/22 1724   PRN Meds: sodium chloride, bisacodyl, diphenhydrAMINE, HYDROcodone-acetaminophen, magic mouthwash, ondansetron (ZOFRAN) IV, mouth rinse, polyethylene glycol, rOPINIRole   Vital Signs    Vitals:   06/25/22 0500 06/25/22 1246 06/25/22 1900 06/26/22 0427  BP: 123/85 133/84  107/63  Pulse: 92 91  88  Resp: '18 18 20 '$ (!) 22  Temp: 98.2 F (36.8 C) 98.1 F (36.7 C) 98.4 F (36.9 C) 97.6 F (36.4 C)  TempSrc: Oral Oral Oral Oral  SpO2: 96% 100%  93%  Weight:      Height:        Intake/Output Summary (Last 24 hours) at 06/26/2022 0826 Last data filed at 06/26/2022 0429 Gross per 24 hour  Intake 220 ml  Output 1500 ml  Net -1280 ml      06/20/2022    8:39 PM 06/20/2022   11:11 AM 06/07/2022   11:49 AM  Last 3 Weights  Weight (lbs) 191 lb 182 lb 184 lb  Weight (kg) 86.637 kg 82.555 kg 83.462 kg      Telemetry    NSR - Personally Reviewed  ECG    N/a - Personally Reviewed  Physical Exam   GEN: No acute distress.   Neck: No JVD Cardiac: RRR, no murmurs, rubs, or gallops.  Respiratory: Clear to auscultation bilaterally. GI: Soft, nontender,  non-distended  MS: No edema; No deformity. Neuro:  Nonfocal  Psych: Normal affect   Labs    High Sensitivity Troponin:   Recent Labs  Lab 06/20/22 1113 06/20/22 1434  TROPONINIHS 16 13     Chemistry Recent Labs  Lab 06/20/22 1113 06/20/22 1536 06/21/22 0051 06/22/22 0434 06/23/22 0800 06/25/22 0758  NA 143  --  136 141 138 139  K 3.2*  --  2.7* 3.8 3.9 3.6  CL 97*  --  101 112* 110 109  CO2 31  --  '28 25 22 22  '$ GLUCOSE 132*  --  78 83 77 79  BUN 23  --  '18 12 10 '$ 5*  CREATININE 1.30*  --  1.08* 0.87 0.93 0.83  CALCIUM 11.6*  --  8.9 9.0 8.9 8.9  MG  --  1.4* 1.8 1.7  --   --   PROT 7.3  --  5.1*  --   --   --   ALBUMIN 4.2  --  2.8*  --   --   --   AST 20  --  18  --   --   --   ALT  13  --  14  --   --   --   ALKPHOS 84  --  67  --   --   --   BILITOT 0.8  --  0.8  --   --   --   GFRNONAA 43*  --  54* >60 >60 >60  ANIONGAP 15  --  7 4* 6 8    Lipids No results for input(s): "CHOL", "TRIG", "HDL", "LABVLDL", "LDLCALC", "CHOLHDL" in the last 168 hours.  Hematology Recent Labs  Lab 06/21/22 0051 06/22/22 0434 06/23/22 0800  WBC 6.1 5.0 4.4  RBC 3.85* 3.64* 3.81*  HGB 12.0 11.2* 12.0  HCT 37.2 35.6* 39.4  MCV 96.6 97.8 103.4*  MCH 31.2 30.8 31.5  MCHC 32.3 31.5 30.5  RDW 14.7 14.6 14.7  PLT 169 140* 144*   Thyroid  Recent Labs  Lab 06/24/22 0446  TSH 20.147*    BNPNo results for input(s): "BNP", "PROBNP" in the last 168 hours.  DDimer No results for input(s): "DDIMER" in the last 168 hours.   Radiology    No results found.  Cardiac Studies    Patient Profile     Brandi Stephens is a 77 y.o. female with a history of atypical chest pain with normal Myoview in 01/2016 and [id number], chronic diastolic CHF, palpitations with rare PAC and brief SVT noted on monitor in 09/2021, hypertension, hyperlipidemia, prediabetes, hypothyroidism, COPD, chronic osteomyelitis of left spine on suppressive antibiotics, left breast cancer s/p lumpectomy and radiation, and  tobacco use who is being seen 06/24/2022 for the evaluation of syncope at the request of Dr. Tyrell Antonio.    Assessment & Plan    1.Syncope/orthostatic hypotension - ongoing outpatient workup for syncope, currently undergoing 30 day outpatient monitor. Plans were for outpatient echo, we have ordered to be done inpatient.   - severely orthostatic this admission.  orthostatics 3/7 SBP 105-->71 with standing, DBP 61-->39. HR 65-->103 Orthostatics 3/9 SBP 130-->71 with standing DBP 89-->48 HR 89 -->128  Given IVFs, started on midodrine this admission, dose up to '5mg'$  tid.  - volume status complicated by chronic N/V - would titrate midodrine based on symptoms not on orthostatic changes. With ongoing symptoms increaset to '10mg'$  tid   Amitryptiline, cymbalta, requip reported side effects of orthostatic hypotension. She reports long term use and no recent changes in dosing.  - no evidence adrenal insufficiency, Cosyntropin test rule out adrenal insufficiency, cortisol at 19--21 per primary team - she is hypothyroid TSH 20, managed by primary team with synthroid incrase this admission     2.Chronic HFpEF - Echo in 01/2021 showed LVEF of 60-65% with mild LVH and grade 1 diastolic dysfunction   - Only on Bumex as needed at home and has not needed this in the past couple of weeks. Continue to hold.    3. Severe sepsis - from notes possibly related to norovirus - Presented with hypotension, tachycardia, tachypnea, lactic acidosis  - per primary team  For questions or updates, please contact [place] Please consult [email or web address] for contact info under        Signed, Carlyle Dolly, MD  06/26/2022, 8:26 AM

## 2022-06-27 ENCOUNTER — Inpatient Hospital Stay (HOSPITAL_COMMUNITY): Payer: Medicare HMO

## 2022-06-27 DIAGNOSIS — A419 Sepsis, unspecified organism: Secondary | ICD-10-CM | POA: Diagnosis not present

## 2022-06-27 DIAGNOSIS — R0989 Other specified symptoms and signs involving the circulatory and respiratory systems: Secondary | ICD-10-CM

## 2022-06-27 DIAGNOSIS — R55 Syncope and collapse: Secondary | ICD-10-CM | POA: Diagnosis not present

## 2022-06-27 DIAGNOSIS — R6520 Severe sepsis without septic shock: Secondary | ICD-10-CM | POA: Diagnosis not present

## 2022-06-27 LAB — BASIC METABOLIC PANEL
Anion gap: 2 — ABNORMAL LOW (ref 5–15)
BUN: 8 mg/dL (ref 8–23)
CO2: 25 mmol/L (ref 22–32)
Calcium: 8.5 mg/dL — ABNORMAL LOW (ref 8.9–10.3)
Chloride: 108 mmol/L (ref 98–111)
Creatinine, Ser: 0.62 mg/dL (ref 0.44–1.00)
GFR, Estimated: >60 mL/min (ref >60)
Glucose, Bld: 97 mg/dL (ref 70–99)
Potassium: 3.5 mmol/L (ref 3.5–5.1)
Sodium: 135 mmol/L (ref 135–145)

## 2022-06-27 LAB — ECHOCARDIOGRAM COMPLETE
Area-P 1/2: 4.17 cm2
Calc EF: 59.1 %
Height: 67.5 in
S' Lateral: 3.65 cm
Single Plane A2C EF: 60.7 %
Single Plane A4C EF: 57 %
Weight: 3056 oz

## 2022-06-27 LAB — HEPATIC FUNCTION PANEL
ALT: 13 U/L (ref 0–44)
AST: 18 U/L (ref 15–41)
Albumin: 2.5 g/dL — ABNORMAL LOW (ref 3.5–5.0)
Alkaline Phosphatase: 59 U/L (ref 38–126)
Bilirubin, Direct: <0.1 mg/dL (ref 0.0–0.2)
Total Bilirubin: 0.4 mg/dL (ref 0.3–1.2)
Total Protein: 4.5 g/dL — ABNORMAL LOW (ref 6.5–8.1)

## 2022-06-27 LAB — CBC
HCT: 34.6 % — ABNORMAL LOW (ref 36.0–46.0)
Hemoglobin: 11.1 g/dL — ABNORMAL LOW (ref 12.0–15.0)
MCH: 30.7 pg (ref 26.0–34.0)
MCHC: 32.1 g/dL (ref 30.0–36.0)
MCV: 95.6 fL (ref 80.0–100.0)
Platelets: 168 10*3/uL (ref 150–400)
RBC: 3.62 MIL/uL — ABNORMAL LOW (ref 3.87–5.11)
RDW: 14.6 % (ref 11.5–15.5)
WBC: 8.2 10*3/uL (ref 4.0–10.5)
nRBC: 0 % (ref 0.0–0.2)

## 2022-06-27 LAB — GLUCOSE, CAPILLARY
Glucose-Capillary: 102 mg/dL — ABNORMAL HIGH (ref 70–99)
Glucose-Capillary: 103 mg/dL — ABNORMAL HIGH (ref 70–99)
Glucose-Capillary: 106 mg/dL — ABNORMAL HIGH (ref 70–99)
Glucose-Capillary: 118 mg/dL — ABNORMAL HIGH (ref 70–99)
Glucose-Capillary: 76 mg/dL (ref 70–99)
Glucose-Capillary: 77 mg/dL (ref 70–99)
Glucose-Capillary: 93 mg/dL (ref 70–99)

## 2022-06-27 LAB — MAGNESIUM: Magnesium: 1.5 mg/dL — ABNORMAL LOW (ref 1.7–2.4)

## 2022-06-27 MED ORDER — MAGNESIUM SULFATE 2 GM/50ML IV SOLN
2.0000 g | Freq: Once | INTRAVENOUS | Status: AC
  Administered 2022-06-27: 2 g via INTRAVENOUS
  Filled 2022-06-27: qty 50

## 2022-06-27 NOTE — Progress Notes (Signed)
Carotid duplex has been completed.   Results can be found under chart review under CV PROC. 06/27/2022 10:41 AM Ausha Sieh RVT, RDMS

## 2022-06-27 NOTE — Progress Notes (Signed)
Physical Therapy Treatment Patient Details Name: Brandi Stephens MRN: [id number] DOB: 03/02/1946 Today's Date: 06/27/2022   History of Present Illness Pt is a 77 year old female admitted 06/20/22 for Severe Sepsis, could be related to norovirus infection, AKI, orthostatic hypotension, N/V.  PMH includes COPD, chronic diastolic CHF, CKD stage II-IIIa, depression, anxiety, gout, hypertension, hyperlipidemia, hypothyroidism, breast cancer in 2017 status post left breast lumpectomy/radiation/antiestrogen therapy, RLS, chronic osteomyelitis of lumbar spine with MSSA on suppressive antibiotics    PT Comments    Pt with gradual improvement but still limited by orthostatic hypotension.  She wore TED hose and abdominal binder.   She was able to ambulate some but then reports shakiness and room spinning - does have drop in BP and has to return to sitting for rest breaks but symptoms resolve quickly.  Continue plan of care   Recommendations for follow up therapy are one component of a multi-disciplinary discharge planning process, led by the attending physician.  Recommendations may be updated based on patient status, additional functional criteria and insurance authorization.  Follow Up Recommendations  Home health PT     Assistance Recommended at Discharge PRN  Patient can return home with the following Help with stairs or ramp for entrance;A little help with walking and/or transfers;Assistance with cooking/housework;Assist for transportation   Equipment Recommendations  None recommended by PT    Recommendations for Other Services       Precautions / Restrictions Precautions Precautions: Fall     Mobility  Bed Mobility Overal bed mobility: Modified Independent             General bed mobility comments: elevated HOB    Transfers Overall transfer level: Needs assistance Equipment used: Rolling walker (2 wheels) Transfers: Sit to/from Stand Sit to Stand: Min guard            General transfer comment: Performed x 1 from bed, x 2 from chair, x 1 from toilet.  Increased time to limit dizziness.    Ambulation/Gait Ambulation/Gait assistance: Min guard Gait Distance (Feet): 60 Feet (15', 10', 30', 60') Assistive device: Rolling walker (2 wheels) Gait Pattern/deviations: Decreased stride length, Step-through pattern Gait velocity: decr     General Gait Details: Started with ambulation to bathroom and then to recliner.  Pt then ambulated around in room.  Limited by dizziness requiring seated rest breaks (see general comments for vitals)   Stairs             Wheelchair Mobility    Modified Rankin (Stroke Patients Only)       Balance Overall balance assessment: History of Falls, Needs assistance   Sitting balance-Leahy Scale: Normal       Standing balance-Leahy Scale: Fair Standing balance comment: RW to ambulate but able to do toileting ADLs without support                            Cognition Arousal/Alertness: Awake/alert Behavior During Therapy: WFL for tasks assessed/performed Overall Cognitive Status: Within Functional Limits for tasks assessed                                          Exercises      General Comments   In supine BP 100/60 prior to session.  Pt needing to use restroom and did not have time to don abdominal binder but did  have TED hose. She had some dizziness with standing but resolved and walked to bathroom. Once seated on toilet BP was 85/58 with MAP 68 and HR 106.  After using restroom , able to don abdominal binder and ambulated to chair. Pt feeling well so ambulated in room 30'.  Tried to get BP in standing but pt became shaky and had to sit.  BP was 92/65.  After resting, she was able to ambulate 60' in room before symptoms began again.  This time BP was 77/65 with MAP 60 and HR 114 taking immediately upon sitting.  Pt reclined and symptoms resolving with BP 85/71, MAP 75, HR 105.   Notified RN pt continues to have orthostatic hypotension.      Pertinent Vitals/Pain Pain Assessment Pain Assessment: No/denies pain    Home Living                          Prior Function            PT Goals (current goals can now be found in the care plan section) Progress towards PT goals: Progressing toward goals    Frequency    Min 3X/week      PT Plan Current plan remains appropriate    Co-evaluation              AM-PAC PT "6 Clicks" Mobility   Outcome Measure  Help needed turning from your back to your side while in a flat bed without using bedrails?: None Help needed moving from lying on your back to sitting on the side of a flat bed without using bedrails?: None Help needed moving to and from a bed to a chair (including a wheelchair)?: A Little Help needed standing up from a chair using your arms (e.g., wheelchair or bedside chair)?: A Little Help needed to walk in hospital room?: A Little Help needed climbing 3-5 steps with a railing? : A Little 6 Click Score: 20    End of Session Equipment Utilized During Treatment: Gait belt Activity Tolerance: Treatment limited secondary to medical complications (Comment) Patient left: with call bell/phone within reach;with chair alarm set;in chair Nurse Communication: Mobility status PT Visit Diagnosis: Difficulty in walking, not elsewhere classified (R26.2)     Time: [id number] PT Time Calculation (min) (ACUTE ONLY): 35 min  Charges:  $Gait Training: 8-22 mins $Therapeutic Activity: 8-22 mins                     Abran Richard, PT Acute Rehab [facility] [phone]    Karlton Lemon 06/27/2022, 1:25 PM

## 2022-06-27 NOTE — Progress Notes (Signed)
PROGRESS NOTE    Brandi Stephens  [id number] DOB: 10/21/1945 DOA: 06/20/2022 PCP: Carlena Hurl, PA-C   Brief Narrative: 77 year old with past medical history significant for diabetes type 2, hypertension, chronic osteomyelitis of lumbar spine with MSSA on suppressive antibiotics.  She has lost about 100 pounds in the last year as she was a started on Ozempic.  She presented to the ED with 2 weeks history of nausea, vomiting and diarrhea.  She also reports some abdominal discomfort.  She was found to have lactic acidosis, lactic acid up to 2.9.  She was hypotensive, improved with IV fluids.  He was admitted for severe sepsis. GI Pathogen positive for norovirus.  Assessment & Plan:   Principal Problem:   Severe sepsis with acute organ dysfunction (HCC) Active Problems:   AKI (acute kidney injury) ([place])   Essential hypertension   Type II diabetes mellitus with complication ([place])  1-Severe Sepsis, could be related to norovirus infection: resolved.  -Presented with hypotension, tachycardia, tachypnea, lactic acidosis. 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 CT negative for infection.  -CT abdomen and pelvis: Shows stool burden, esophagitis -C. difficile negative, GI pathogen positive for norovirus -Was treated with IV antibiotics, which has been discontinued by ID -Blood cultures: No growth to date,   2-Acute kidney injury: Creatinine baseline 0.9 presented with a creatinine of 1.3 in the setting of hypovolemia.   Treated with IV fluids  3-Nausea, vomiting -Suspect related to norovirus -Continue with  IV Protonix, as needed Zofran Report some improvement.  Continue with Magic Mouth wash.  She is trying to eat more.   4-Orthostatic Hypotension, recurrent Syncope: Received IV fluids, and IV bolus.  Cosyntropin test rule out adrenal insufficiency, cortisol at 19--21  Hold Metoprolol. Cardiology  consulted. Has history of PVC Ted hose, will add abdominal binder.  Increase midodrine to 10 mg TID 3/10. Advised to use abdominal binder, TED, oral intake and out of bed.  ECHO pending.  Carotid doppler: No significant stenosis.   Hypokalemia; Replaced.   Hypomagnesemia; IV magnesium Ordered.  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  ID recommend Repeat CT Lumbar spine. CT spine L 2 loose screw, T 12 compression fracture. Discussed CT finding with Neurosurgery Dr Merdis Delay, who thinks CT funding are chronic. He doesn't see significant change for infection.   Estimated body mass index is 29.47 kg/m as calculated from the following:   Height as of this encounter: 5' 7.5" (1.715 m).   Weight as of this encounter: 86.6 kg.   DVT prophylaxis: Lovenox Code Status: Full code Family Communication: Care discussed with patient.  Disposition Plan:  Status is: Inpatient Remains inpatient appropriate because: management of nausea, dehydration.     Consultants:  ID  Procedures:  None  Antimicrobials:    Subjective: She report some improvement of dizziness today.  She is trying to eat more.   Objective: Vitals:   06/26/22 2002 06/27/22 0411 06/27/22 0800 06/27/22 1221  BP: 129/84 (!) 100/56  100/60  Pulse: 92 81  78  Resp: '19 18 19 20  '$ Temp: 98.3 F (36.8 C) 98.1 F (36.7 C)  98.2 F (36.8 C)  TempSrc:    Oral  SpO2: 92% 93%  99%  Weight:      Height:        Intake/Output Summary (Last 24 hours) at 06/27/2022 1405 Last  data filed at 06/27/2022 1100 Gross per 24 hour  Intake 4882.5 ml  Output 450 ml  Net 4432.5 ml    Filed Weights   06/20/22 1111 06/20/22 2039  Weight: 82.6 kg 86.6 kg    Examination:  General exam: NAD Respiratory system: CTA Cardiovascular system: S 1, S 2 RRR Gastrointestinal system: BS present, soft, nt Central nervous system: alert, follows  command Extremities: mild edema right hand   Data Reviewed: I have personally reviewed following labs and imaging studies  CBC: Recent Labs  Lab 06/21/22 0051 06/22/22 0434 06/23/22 0800 06/27/22 0337  WBC 6.1 5.0 4.4 8.2  HGB 12.0 11.2* 12.0 11.1*  HCT 37.2 35.6* 39.4 34.6*  MCV 96.6 97.8 103.4* 95.6  PLT 169 140* 144* [id number]    Basic Metabolic Panel: Recent Labs  Lab 06/20/22 1536 06/21/22 0051 06/22/22 0434 06/23/22 0800 06/25/22 0758 06/27/22 0337  NA  --  136 141 138 139 135  K  --  2.7* 3.8 3.9 3.6 3.5  CL  --  101 112* 110 109 108  CO2  --  '28 25 22 22 25  '$ GLUCOSE  --  78 83 77 79 97  BUN  --  '18 12 10 '$ 5* 8  CREATININE  --  1.08* 0.87 0.93 0.83 0.62  CALCIUM  --  8.9 9.0 8.9 8.9 8.5*  MG 1.4* 1.8 1.7  --   --  1.5*  PHOS  --   --  2.8  --   --   --     GFR: Estimated Creatinine Clearance: 69.4 mL/min (by C-G formula based on SCr of 0.62 mg/dL). Liver Function Tests: Recent Labs  Lab 06/21/22 0051 06/27/22 0337  AST 18 18  ALT 14 13  ALKPHOS 67 59  BILITOT 0.8 0.4  PROT 5.1* 4.5*  ALBUMIN 2.8* 2.5*    No results for input(s): "LIPASE", "AMYLASE" in the last 168 hours. No results for input(s): "AMMONIA" in the last 168 hours. Coagulation Profile: Recent Labs  Lab 06/21/22 0051  INR 1.0    Cardiac Enzymes: No results for input(s): "CKTOTAL", "CKMB", "CKMBINDEX", "TROPONINI" in the last 168 hours. BNP (last 3 results) No results for input(s): "PROBNP" in the last 8760 hours. HbA1C: No results for input(s): "HGBA1C" in the last 72 hours. CBG: Recent Labs  Lab 06/26/22 1959 06/27/22 0009 06/27/22 0406 06/27/22 0736 06/27/22 1216  GLUCAP 99 102* 106* 76 118*    Lipid Profile: No results for input(s): "CHOL", "HDL", "LDLCALC", "TRIG", "CHOLHDL", "LDLDIRECT" in the last 72 hours. Thyroid Function Tests: No results for input(s): "TSH", "T4TOTAL", "FREET4", "T3FREE", "THYROIDAB" in the last 72 hours.  Anemia Panel: No results for  input(s): "VITAMINB12", "FOLATE", "FERRITIN", "TIBC", "IRON", "RETICCTPCT" in the last 72 hours. Sepsis Labs: Recent Labs  Lab 06/20/22 1343 06/20/22 1536 06/20/22 1730 06/21/22 0051  PROCALCITON  --   --   --  <0.10  LATICACIDVEN 2.8* 2.6* 1.9  --      Recent Results (from the past 240 hour(s))  Culture, blood (Routine x 2)     Status: None   Collection Time: 06/20/22 11:18 AM   Specimen: Right Antecubital; Blood  Result Value Ref Range Status   Specimen Description   Final    RIGHT ANTECUBITAL BLOOD Performed at [facility] [address] [place], [place] [address]    Special Requests   Final    BOTTLES DRAWN AEROBIC AND ANAEROBIC Blood Culture adequate volume Performed at [facility],  [address], [place], [place] [address]    Culture   Final    NO GROWTH 5 DAYS Performed at [facility], [place] [address] [place], [place] [address]    Report Status 06/25/2022 FINAL  Final  Culture, blood (Routine x 2)     Status: None   Collection Time: 06/20/22 12:20 PM   Specimen: BLOOD RIGHT HAND  Result Value Ref Range Status   Specimen Description   Final    BLOOD RIGHT HAND Performed at [facility], [address], [place], [place] [address]    Special Requests   Final    BOTTLES DRAWN AEROBIC AND ANAEROBIC Blood Culture adequate volume Performed at [facility], [address], [place], [place] [address]    Culture   Final    NO GROWTH 5 DAYS Performed at [facility], [place] [address], [place], [place] [address]    Report Status 06/25/2022 FINAL  Final  Resp panel by RT-PCR (RSV, Flu A&B, Covid) Anterior Nasal Swab     Status: None   Collection Time: 06/20/22  1:43 PM   Specimen: Anterior Nasal Swab  Result Value Ref Range Status   SARS Coronavirus 2 by RT PCR NEGATIVE NEGATIVE Final    Comment: (NOTE) SARS-CoV-2 target nucleic acids are NOT DETECTED.  The SARS-CoV-2 RNA is generally  detectable in upper respiratory specimens during the acute phase of infection. The lowest concentration of SARS-CoV-2 viral copies this assay can detect is 138 copies/mL. A negative result does not preclude SARS-Cov-2 infection and should not be used as the sole basis for treatment or other patient management decisions. A negative result may occur with  improper specimen collection/handling, submission of specimen other than nasopharyngeal swab, presence of viral mutation(s) within the areas targeted by this assay, and inadequate number of viral copies(<138 copies/mL). A negative result must be combined with clinical observations, patient history, and epidemiological information. The expected result is Negative.  Fact Sheet for Patients:  EntrepreneurPulse.com.au  Fact Sheet for Healthcare Providers:  IncredibleEmployment.be  This test is no t yet approved or cleared by the [place] FDA and  has been authorized for detection and/or diagnosis of SARS-CoV-2 by FDA under an Emergency Use Authorization (EUA). This EUA will remain  in effect (meaning this test can be used) for the duration of the COVID-19 declaration under Section 564(b)(1) of the Act, 21 U.S.C.section 360bbb-3(b)(1), unless the authorization is terminated  or revoked sooner.       Influenza A by PCR NEGATIVE NEGATIVE Final   Influenza B by PCR NEGATIVE NEGATIVE Final    Comment: (NOTE) The Xpert Xpress SARS-CoV-2/FLU/RSV plus assay is intended as an aid in the diagnosis of influenza from Nasopharyngeal swab specimens and should not be used as a sole basis for treatment. Nasal washings and aspirates are unacceptable for Xpert Xpress SARS-CoV-2/FLU/RSV testing.  Fact Sheet for Patients: EntrepreneurPulse.com.au  Fact Sheet for Healthcare Providers: IncredibleEmployment.be  This test is not yet approved or cleared by the [place] FDA  and has been authorized for detection and/or diagnosis of SARS-CoV-2 by FDA under an Emergency Use Authorization (EUA). This EUA will remain in effect (meaning this test can be used) for the duration of the COVID-19 declaration under Section 564(b)(1) of the Act, 21 U.S.C. section 360bbb-3(b)(1), unless the authorization is terminated or revoked.     Resp Syncytial Virus by PCR NEGATIVE NEGATIVE Final    Comment: (NOTE) Fact Sheet for Patients: EntrepreneurPulse.com.au  Fact Sheet  for Healthcare Providers: IncredibleEmployment.be  This test is not yet approved or cleared by the [place] and has been authorized for detection and/or diagnosis of SARS-CoV-2 by FDA under an Emergency Use Authorization (EUA). This EUA will remain in effect (meaning this test can be used) for the duration of the COVID-19 declaration under Section 564(b)(1) of the Act, 21 U.S.C. section 360bbb-3(b)(1), unless the authorization is terminated or revoked.  Performed at [facility], [place], [place] [address]   C Difficile Quick Screen w PCR reflex     Status: None   Collection Time: 06/21/22  9:09 AM  Result Value Ref Range Status   C Diff antigen NEGATIVE NEGATIVE Final   C Diff toxin NEGATIVE NEGATIVE Final   C Diff interpretation No C. difficile detected.  Final    Comment: Performed at [facility], [place] [address], [place], [place] [address]  Gastrointestinal Panel by PCR , Stool     Status: Abnormal   Collection Time: 06/21/22  9:09 AM  Result Value Ref Range Status   Campylobacter species NOT DETECTED NOT DETECTED Final   Plesimonas shigelloides NOT DETECTED NOT DETECTED Final   Salmonella species NOT DETECTED NOT DETECTED Final   Yersinia enterocolitica NOT DETECTED NOT DETECTED Final   Vibrio species NOT DETECTED NOT DETECTED Final   Vibrio cholerae NOT DETECTED NOT DETECTED Final    Enteroaggregative E coli (EAEC) NOT DETECTED NOT DETECTED Final   Enteropathogenic E coli (EPEC) NOT DETECTED NOT DETECTED Final   Enterotoxigenic E coli (ETEC) NOT DETECTED NOT DETECTED Final   Shiga like toxin producing E coli (STEC) NOT DETECTED NOT DETECTED Final   Shigella/Enteroinvasive E coli (EIEC) NOT DETECTED NOT DETECTED Final   Cryptosporidium NOT DETECTED NOT DETECTED Final   Cyclospora cayetanensis NOT DETECTED NOT DETECTED Final   Entamoeba histolytica NOT DETECTED NOT DETECTED Final   Giardia lamblia NOT DETECTED NOT DETECTED Final   Adenovirus F40/41 NOT DETECTED NOT DETECTED Final   Astrovirus NOT DETECTED NOT DETECTED Final   Norovirus GI/GII DETECTED (A) NOT DETECTED Final    Comment: RESULT CALLED TO, READ BACK BY AND VERIFIED WITH: Margarita Mail RN '@[id number]'$  06/22/22 ASW    Rotavirus A NOT DETECTED NOT DETECTED Final   Sapovirus (I, II, IV, and V) NOT DETECTED NOT DETECTED Final    Comment: Performed at [facility], [address], [place], [place] [address]         Radiology Studies: VAS US CAROTID  Result Date: 06/27/2022 Carotid Arterial Duplex Study Patient Name:  ELAURA NACHTMAN  Date of Exam:   06/27/2022 Medical Rec #: [id number]         Accession #:    [id number] Date of Birth: June 28, 1945         Patient Gender: F Patient Age:   77 years Exam Location:  [facility] Procedure:      VAS US CAROTID Referring Phys: Niel Hummer --------------------------------------------------------------------------------  Indications:      Bilateral bruits and Syncope. Risk Factors:     Hypertension, hyperlipidemia, Diabetes, past history of                   smoking. Other Factors:    CHF. Comparison Study: No previous exams Performing Technologist: Jody Hill RVT, RDMS  Examination Guidelines: A complete evaluation includes B-mode imaging, spectral Doppler, color Doppler, and power Doppler as needed of all accessible portions of each vessel. Bilateral testing is  considered an  integral part of a complete examination. Limited examinations for reoccurring indications may be performed as noted.  Right Carotid Findings: +----------+--------+--------+--------+------------------+------------------+           PSV cm/sEDV cm/sStenosisPlaque DescriptionComments           +----------+--------+--------+--------+------------------+------------------+ CCA Prox  80      13                                intimal thickening +----------+--------+--------+--------+------------------+------------------+ CCA Distal67      21                                intimal thickening +----------+--------+--------+--------+------------------+------------------+ ICA Prox  66      19              calcific                             +----------+--------+--------+--------+------------------+------------------+ ICA Distal48      21                                                   +----------+--------+--------+--------+------------------+------------------+ ECA       77      11                                                   +----------+--------+--------+--------+------------------+------------------+ +----------+--------+-------+----------------+-------------------+           PSV cm/sEDV cmsDescribe        Arm Pressure (mmHG) +----------+--------+-------+----------------+-------------------+ [id number]            Multiphasic, WNL                    +----------+--------+-------+----------------+-------------------+ +---------+--------+--+--------+-+---------+ VertebralPSV cm/s28EDV cm/s9Antegrade +---------+--------+--+--------+-+---------+  Left Carotid Findings: +----------+--------+--------+--------+------------------+------------------+           PSV cm/sEDV cm/sStenosisPlaque DescriptionComments           +----------+--------+--------+--------+------------------+------------------+ CCA Prox  72      17                                 intimal thickening +----------+--------+--------+--------+------------------+------------------+ CCA Distal60      19                                intimal thickening +----------+--------+--------+--------+------------------+------------------+ ICA Prox  61      27                                tortuous           +----------+--------+--------+--------+------------------+------------------+ ICA Distal54      19                                                   +----------+--------+--------+--------+------------------+------------------+  ECA       76      11                                                   +----------+--------+--------+--------+------------------+------------------+ +----------+--------+--------+----------------+-------------------+           PSV cm/sEDV cm/sDescribe        Arm Pressure (mmHG) +----------+--------+--------+----------------+-------------------+ [id number]             Multiphasic, WNL                    +----------+--------+--------+----------------+-------------------+ +---------+--------+--+--------+--+---------+ VertebralPSV cm/s43EDV cm/s17Antegrade +---------+--------+--+--------+--+---------+   Summary: Right Carotid: The extracranial vessels were near-normal with only minimal wall                thickening or plaque. Left Carotid: The extracranial vessels were near-normal with only minimal wall               thickening or plaque. Vertebrals:  Bilateral vertebral arteries demonstrate antegrade flow. Subclavians: Normal flow hemodynamics were seen in bilateral subclavian              arteries. *See table(s) above for measurements and observations.  Electronically signed by Antony Contras MD on 06/27/2022 at 11:13:08 AM.    Final         Scheduled Meds:  allopurinol  300 mg Oral Daily   amitriptyline  100 mg Oral QHS   atorvastatin  80 mg Oral Daily   cefadroxil  500 mg Oral BID   DULoxetine  60 mg Oral Daily    enoxaparin (LOVENOX) injection  40 mg Subcutaneous Q24H   feeding supplement  237 mL Oral TID BM   insulin aspart  0-9 Units Subcutaneous Q4H   levothyroxine  125 mcg Oral Q0600   midodrine  10 mg Oral TID WC   pantoprazole  40 mg Oral BID   primidone  50 mg Oral BID   topiramate  50 mg Oral QHS   Continuous Infusions:  sodium chloride Stopped (06/20/22 1800)   dextrose 5% lactated ringers 100 mL/hr at 06/27/22 1302     LOS: 7 days    Time spent: 35 minutes    Molly Maselli A Mayford Alberg, MD [facility]   If 7PM-7AM, please contact night-coverage [email or web address]  06/27/2022, 2:05 PM

## 2022-06-27 NOTE — Progress Notes (Addendum)
Rounding Note    Patient Name: Brandi Stephens Date of Encounter: 06/27/2022  [place] Cardiologist: Janina Mayo, MD   Subjective   Patient reports that her dizziness is less noticeable today. She is wearing her compression socks and has an abdominal binder that she wore for several hours yesterday. Nausea and vomiting are improving, and she is trying to eat more   Inpatient Medications    Scheduled Meds:  allopurinol  300 mg Oral Daily   amitriptyline  100 mg Oral QHS   atorvastatin  80 mg Oral Daily   cefadroxil  500 mg Oral BID   DULoxetine  60 mg Oral Daily   enoxaparin (LOVENOX) injection  40 mg Subcutaneous Q24H   feeding supplement  237 mL Oral TID BM   insulin aspart  0-9 Units Subcutaneous Q4H   levothyroxine  125 mcg Oral Q0600   midodrine  10 mg Oral TID WC   pantoprazole  40 mg Oral BID   primidone  50 mg Oral BID   topiramate  50 mg Oral QHS   Continuous Infusions:  sodium chloride Stopped (06/20/22 1800)   dextrose 5% lactated ringers 100 mL/hr at 06/27/22 0020   PRN Meds: sodium chloride, bisacodyl, diphenhydrAMINE, HYDROcodone-acetaminophen, magic mouthwash, ondansetron (ZOFRAN) IV, mouth rinse, polyethylene glycol, rOPINIRole   Vital Signs    Vitals:   06/26/22 1145 06/26/22 2002 06/27/22 0411 06/27/22 0800  BP: 119/77 129/84 (!) 100/56   Pulse: 94 92 81   Resp:  '19 18 19  '$ Temp:  98.3 F (36.8 C) 98.1 F (36.7 C)   TempSrc:      SpO2:  92% 93%   Weight:      Height:        Intake/Output Summary (Last 24 hours) at 06/27/2022 1050 Last data filed at 06/27/2022 0845 Gross per 24 hour  Intake 5002.5 ml  Output 450 ml  Net 4552.5 ml      06/20/2022    8:39 PM 06/20/2022   11:11 AM 06/07/2022   11:49 AM  Last 3 Weights  Weight (lbs) 191 lb 182 lb 184 lb  Weight (kg) 86.637 kg 82.555 kg 83.462 kg      Telemetry    NSR  - Personally Reviewed  ECG     No new tracings since 3/5 - Personally Reviewed  Physical Exam    GEN: No acute distress.  Laying in the bed with head elevated, eating breakfast  Neck: No JVD Cardiac: RRR, no murmurs, rubs, or gallops.  Respiratory: End expiratory wheezing in the upper lung fields. Otherwise clear to auscultation. Normal WOB on room air  GI: Soft, nontender, non-distended  MS: No edema in BLE, wearing compression stockings to the hip  Neuro:  Nonfocal  Psych: Normal affect   Labs    High Sensitivity Troponin:   Recent Labs  Lab 06/20/22 1113 06/20/22 1434  TROPONINIHS 16 13     Chemistry Recent Labs  Lab 06/20/22 1113 06/20/22 1536 06/21/22 0051 06/22/22 0434 06/23/22 0800 06/25/22 0758 06/27/22 0337  NA 143  --  136 141 138 139 135  K 3.2*  --  2.7* 3.8 3.9 3.6 3.5  CL 97*  --  101 112* 110 109 108  CO2 31  --  '28 25 22 22 25  '$ GLUCOSE 132*  --  78 83 77 79 97  BUN 23  --  '18 12 10 '$ 5* 8  CREATININE 1.30*  --  1.08* 0.87 0.93 0.83  0.62  CALCIUM 11.6*  --  8.9 9.0 8.9 8.9 8.5*  MG  --    < > 1.8 1.7  --   --  1.5*  PROT 7.3  --  5.1*  --   --   --  4.5*  ALBUMIN 4.2  --  2.8*  --   --   --  2.5*  AST 20  --  18  --   --   --  18  ALT 13  --  14  --   --   --  13  ALKPHOS 84  --  67  --   --   --  59  BILITOT 0.8  --  0.8  --   --   --  0.4  GFRNONAA 43*  --  54* >60 >60 >60 >60  ANIONGAP 15  --  7 4* 6 8 2*   < > = values in this interval not displayed.    Lipids No results for input(s): "CHOL", "TRIG", "HDL", "LABVLDL", "LDLCALC", "CHOLHDL" in the last 168 hours.  Hematology Recent Labs  Lab 06/22/22 0434 06/23/22 0800 06/27/22 0337  WBC 5.0 4.4 8.2  RBC 3.64* 3.81* 3.62*  HGB 11.2* 12.0 11.1*  HCT 35.6* 39.4 34.6*  MCV 97.8 103.4* 95.6  MCH 30.8 31.5 30.7  MCHC 31.5 30.5 32.1  RDW 14.6 14.7 14.6  PLT 140* 144* 168   Thyroid  Recent Labs  Lab 06/24/22 0446  TSH 20.147*    BNPNo results for input(s): "BNP", "PROBNP" in the last 168 hours.  DDimer No results for input(s): "DDIMER" in the last 168 hours.   Radiology     VAS US CAROTID  Result Date: 06/27/2022 Carotid Arterial Duplex Study Patient Name:  Brandi Stephens  Date of Exam:   06/27/2022 Medical Rec #: [id number]         Accession #:    [id number] Date of Birth: [birth year]         Patient Gender: F Patient Age:   77 years Exam Location:  [facility] Procedure:      VAS US CAROTID Referring Phys: Niel Hummer --------------------------------------------------------------------------------  Indications:      Bilateral bruits and Syncope. Risk Factors:     Hypertension, hyperlipidemia, Diabetes, past history of                   smoking. Other Factors:    CHF. Comparison Study: No previous exams Performing Technologist: Jody Hill RVT, RDMS  Examination Guidelines: A complete evaluation includes B-mode imaging, spectral Doppler, color Doppler, and power Doppler as needed of all accessible portions of each vessel. Bilateral testing is considered an integral part of a complete examination. Limited examinations for reoccurring indications may be performed as noted.  Right Carotid Findings: +----------+--------+--------+--------+------------------+------------------+           PSV cm/sEDV cm/sStenosisPlaque DescriptionComments           +----------+--------+--------+--------+------------------+------------------+ CCA Prox  80      13                                intimal thickening +----------+--------+--------+--------+------------------+------------------+ CCA Distal67      21                                intimal thickening +----------+--------+--------+--------+------------------+------------------+ ICA Prox  66  19              calcific                             +----------+--------+--------+--------+------------------+------------------+ ICA Distal48      21                                                   +----------+--------+--------+--------+------------------+------------------+ ECA       77      11                                                    +----------+--------+--------+--------+------------------+------------------+ +----------+--------+-------+----------------+-------------------+           PSV cm/sEDV cmsDescribe        Arm Pressure (mmHG) +----------+--------+-------+----------------+-------------------+ [id number]            Multiphasic, WNL                    +----------+--------+-------+----------------+-------------------+ +---------+--------+--+--------+-+---------+ VertebralPSV cm/s28EDV cm/s9Antegrade +---------+--------+--+--------+-+---------+  Left Carotid Findings: +----------+--------+--------+--------+------------------+------------------+           PSV cm/sEDV cm/sStenosisPlaque DescriptionComments           +----------+--------+--------+--------+------------------+------------------+ CCA Prox  72      17                                intimal thickening +----------+--------+--------+--------+------------------+------------------+ CCA Distal60      19                                intimal thickening +----------+--------+--------+--------+------------------+------------------+ ICA Prox  61      27                                tortuous           +----------+--------+--------+--------+------------------+------------------+ ICA Distal54      19                                                   +----------+--------+--------+--------+------------------+------------------+ ECA       76      11                                                   +----------+--------+--------+--------+------------------+------------------+ +----------+--------+--------+----------------+-------------------+           PSV cm/sEDV cm/sDescribe        Arm Pressure (mmHG) +----------+--------+--------+----------------+-------------------+ [id number]             Multiphasic, WNL                     +----------+--------+--------+----------------+-------------------+ +---------+--------+--+--------+--+---------+ VertebralPSV cm/s43EDV cm/s17Antegrade +---------+--------+--+--------+--+---------+   Summary: Right Carotid: The extracranial vessels  were near-normal with only minimal wall                thickening or plaque. Left Carotid: The extracranial vessels were near-normal with only minimal wall               thickening or plaque. Vertebrals:  Bilateral vertebral arteries demonstrate antegrade flow. Subclavians: Normal flow hemodynamics were seen in bilateral subclavian              arteries. *See table(s) above for measurements and observations.     Preliminary     Cardiac Studies     Patient Profile     77 y.o. female  with a history of atypical chest pain with normal Myoview in 01/2016 and [id number], chronic diastolic CHF, palpitations with rare PAC and brief SVT noted on monitor in 09/2021, hypertension, hyperlipidemia, prediabetes, hypothyroidism, COPD, chronic osteomyelitis of left spine on suppressive antibiotics, left breast cancer s/p lumpectomy and radiation, and tobacco use who is being seen for the evaluation of syncope   Assessment & Plan    Syncope  Orthostatic hypotension  - Patient already has an ongoing outpatient workup for syncope- currently wearing 30 day monitor. Telemetry shows NSR  - Echocardiogram this admission pending. Carotid ultrasound formal read pending  - Patient has been very orthostatic this admission-  orthostatics 3/7 SBP 105-->71 with standing, DBP 61-->39. HR 65-->103 Orthostatics 3/9 SBP 130-->71 with standing DBP 89-->48 HR 89 -->128 - Patient was given IV fluids. Of note, patient has chronic nausea and vomiting, which does increase her risk of dehydration. Stressed the importance of oral hydration. She is trying to eat more now that her nausea has improve  - Now on midodrine 10 mg TID. Dizziness is improving today  - No evidence of adrenal  insufficiency- cosyntropin test ruled out adrenal insufficiency, cortisol at 19--21  - Agree with ted hose and abdominal binder  - Hypothyroid- TSH 20. Managed per primary   Otherwise per primary  - AKI  - Nausea, vomiting - Severe Sepsis, possibly related to norovirus infection   For questions or updates, please contact [place] Please consult [email or web address] for contact info under        Signed, Margie Billet, PA-C  06/27/2022, 10:50 AM    Personally seen and examined. Agree with above.  Dizziness improving with compression hose, midodrine, hydration.  Telemetry normal.  Subtle wheezes heard on exam.  TSH elevated at 20.  Syncope and orthostatic hypotension - Continue with current management midodrine 10 mg 3 times daily.  There has been no evidence of adrenal insufficiency on cosyntropin stim test. Excellent. Spoke to her about precautions.  Being very careful especially given her underlying orthostasis.  Salting food, fluid liberalization is important.  Compression hose.  She was very thankful.  Norovirus - Improved.  We will go ahead and sign off.  Please let us know if we can be of further assistance.  Candee Furbish, MD

## 2022-06-27 NOTE — Progress Notes (Signed)
Echocardiogram 2D Echocardiogram has been performed.  Brandi Stephens 06/27/2022, 3:14 PM

## 2022-06-28 LAB — BASIC METABOLIC PANEL
Anion gap: 6 (ref 5–15)
BUN: 10 mg/dL (ref 8–23)
CO2: 24 mmol/L (ref 22–32)
Calcium: 8.7 mg/dL — ABNORMAL LOW (ref 8.9–10.3)
Chloride: 107 mmol/L (ref 98–111)
Creatinine, Ser: 0.79 mg/dL (ref 0.44–1.00)
GFR, Estimated: >60 mL/min (ref >60)
Glucose, Bld: 96 mg/dL (ref 70–99)
Potassium: 3.6 mmol/L (ref 3.5–5.1)
Sodium: 137 mmol/L (ref 135–145)

## 2022-06-28 LAB — GLUCOSE, CAPILLARY
Glucose-Capillary: 104 mg/dL — ABNORMAL HIGH (ref 70–99)
Glucose-Capillary: 93 mg/dL (ref 70–99)
Glucose-Capillary: 76 mg/dL (ref 70–99)
Glucose-Capillary: 75 mg/dL (ref 70–99)
Glucose-Capillary: 71 mg/dL (ref 70–99)

## 2022-06-28 LAB — MAGNESIUM: Magnesium: 1.9 mg/dL (ref 1.7–2.4)

## 2022-06-28 MED ORDER — MAGNESIUM SULFATE 2 GM/50ML IV SOLN
2.0000 g | Freq: Once | INTRAVENOUS | Status: AC
  Administered 2022-06-28: 2 g via INTRAVENOUS
  Filled 2022-06-28: qty 50

## 2022-06-28 NOTE — Progress Notes (Signed)
PROGRESS NOTE    Brandi Stephens  [id number] DOB: May 05, 1945 DOA: 06/20/2022 PCP: Carlena Hurl, PA-C   Brief Narrative: 77 year old with past medical history significant for diabetes type 2, hypertension, chronic osteomyelitis of lumbar spine with MSSA on suppressive antibiotics.  She has lost about 100 pounds in the last year as she was a started on Ozempic.  She presented to the ED with 2 weeks history of nausea, vomiting and diarrhea.  She also reports some abdominal discomfort.  She was found to have lactic acidosis, lactic acid up to 2.9.  She was hypotensive, improved with IV fluids.  He was admitted for severe sepsis. GI Pathogen positive for norovirus.  Sepsis has resolved.  Norovirus resolved.  Patient was also found to have orthostatic hypotension of unclear etiology, with a component of dehydration.  She was a started on midodrine and dose titrated to 10 mg 3 times daily.  She was a started Ted hose  and abdominal binder.  2D echo and carotid Doppler negative.  Plan to observe 1 more day on current medication goal to discharge home on 3/13.  Assessment & Plan:   Principal Problem:   Severe sepsis with acute organ dysfunction (HCC) Active Problems:   AKI (acute kidney injury) ([facility])   Essential hypertension   Type II diabetes mellitus with complication (HCC)  1-Orthostatic Hypotension, recurrent Syncope: Received IV fluids, and IV bolus.  Cosyntropin test rule out adrenal insufficiency, cortisol at 19--21  Hold Metoprolol. Cardiology consulted. Has history of PVC Ted hose,  abdominal binder.  Increase midodrine to 10 mg TID 3/10. Advised to use abdominal binder, TED, oral intake and out of bed.  ECHO Normal EF, no Aortic stenosis.  Carotid doppler: No significant stenosis.  Plan to continue with PT.   Severe Sepsis, could be related to norovirus infection: resolved.  -Presented with hypotension, tachycardia, tachypnea, lactic acidosis. -She has history of chronic  osteomyelitis of L-spine / infected hardware > MSSA, remains on chronic antibiotics for this. CT negative for infection.  -CT abdomen and pelvis: Shows stool burden, esophagitis -C. difficile negative, GI pathogen positive for norovirus -Was treated with IV antibiotics, which has been discontinued by ID -Blood cultures: No growth to date,   2-Acute kidney injury: Creatinine baseline 0.9 presented with a creatinine of 1.3 in the setting of hypovolemia.   Treated with IV fluids  3-Nausea, vomiting -Suspect related to norovirus -Continue with  IV Protonix, as needed Zofran Report some improvement.  Continue with Magic Mouth wash.  Eating more.    Hypokalemia; Replaced.   Hypomagnesemia;Replaced.  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  ID recommend Repeat CT Lumbar spine. CT spine L 2 loose screw, T 12 compression fracture. Discussed CT finding with Neurosurgery Dr Merdis Delay, who thinks CT funding are chronic. He doesn't see significant change for infection.   Estimated body mass index is 29.47 kg/m as calculated from the following:   Height as of this encounter: 5' 7.5" (1.715 m).   Weight as of this encounter: 86.6 kg.   DVT prophylaxis: Lovenox Code Status: Full code Family Communication: Care discussed with patient, and daughter at bedside.  Disposition Plan:  Status is: Inpatient Remains inpatient appropriate because: management of nausea, dehydration.     Consultants:  ID  Procedures:  None  Antimicrobials:    Subjective: She was doing well this morning. Orthostatic vital better. This afternoon got some dizziness.  We talk  about precaution.    Objective: Vitals:   06/27/22 1956 06/28/22 0436 06/28/22 0800 06/28/22 1256  BP: (!) 135/90 129/72  106/61  Pulse: 85 76  77  Resp: '17 17 17 18  '$ Temp: 98.9 F (37.2 C) 98.2 F (36.8 C)  (!) 97.5 F (36.4 C)  TempSrc: Oral  Oral    SpO2: 97% 95%  92%  Weight:      Height:        Intake/Output Summary (Last 24 hours) at 06/28/2022 1434 Last data filed at 06/28/2022 1100 Gross per 24 hour  Intake 3234.47 ml  Output 2450 ml  Net 784.47 ml    Filed Weights   06/20/22 1111 06/20/22 2039  Weight: 82.6 kg 86.6 kg    Examination:  General exam: NAD Respiratory system: CTA Cardiovascular system: S,1   S 2 RRR Gastrointestinal system: BS present, soft, nt Central nervous system: Alert, follows command Extremities: mild edema right hand   Data Reviewed: I have personally reviewed following labs and imaging studies  CBC: Recent Labs  Lab 06/22/22 0434 06/23/22 0800 06/27/22 0337  WBC 5.0 4.4 8.2  HGB 11.2* 12.0 11.1*  HCT 35.6* 39.4 34.6*  MCV 97.8 103.4* 95.6  PLT 140* 144* [id number]    Basic Metabolic Panel: Recent Labs  Lab 06/22/22 0434 06/23/22 0800 06/25/22 0758 06/27/22 0337 06/28/22 0414  NA 141 138 139 135 137  K 3.8 3.9 3.6 3.5 3.6  CL 112* 110 109 108 107  CO2 '25 22 22 25 24  '$ GLUCOSE 83 77 79 97 96  BUN 12 10 5* 8 10  CREATININE 0.87 0.93 0.83 0.62 0.79  CALCIUM 9.0 8.9 8.9 8.5* 8.7*  MG 1.7  --   --  1.5* 1.9  PHOS 2.8  --   --   --   --     GFR: Estimated Creatinine Clearance: 69.4 mL/min (by C-G formula based on SCr of 0.79 mg/dL). Liver Function Tests: Recent Labs  Lab 06/27/22 0337  AST 18  ALT 13  ALKPHOS 59  BILITOT 0.4  PROT 4.5*  ALBUMIN 2.5*    No results for input(s): "LIPASE", "AMYLASE" in the last 168 hours. No results for input(s): "AMMONIA" in the last 168 hours. Coagulation Profile: No results for input(s): "INR", "PROTIME" in the last 168 hours.  Cardiac Enzymes: No results for input(s): "CKTOTAL", "CKMB", "CKMBINDEX", "TROPONINI" in the last 168 hours. BNP (last 3 results) No results for input(s): "PROBNP" in the last 8760 hours. HbA1C: No results for input(s): "HGBA1C" in the last 72 hours. CBG: Recent Labs  Lab 06/27/22 1952  06/27/22 2324 06/28/22 0433 06/28/22 0733 06/28/22 1132  GLUCAP 77 103* 104* 93 76    Lipid Profile: No results for input(s): "CHOL", "HDL", "LDLCALC", "TRIG", "CHOLHDL", "LDLDIRECT" in the last 72 hours. Thyroid Function Tests: No results for input(s): "TSH", "T4TOTAL", "FREET4", "T3FREE", "THYROIDAB" in the last 72 hours.  Anemia Panel: No results for input(s): "VITAMINB12", "FOLATE", "FERRITIN", "TIBC", "IRON", "RETICCTPCT" in the last 72 hours. Sepsis Labs: No results for input(s): "PROCALCITON", "LATICACIDVEN" in the last 168 hours.   Recent Results (from the past 240 hour(s))  Culture, blood (Routine x 2)     Status: None   Collection Time: 06/20/22 11:18 AM   Specimen: Right Antecubital; Blood  Result Value Ref Range Status   Specimen Description   Final    RIGHT ANTECUBITAL BLOOD Performed at [facility], [address] [place], [place] [address]    Special Requests  Final    BOTTLES DRAWN AEROBIC AND ANAEROBIC Blood Culture adequate volume Performed at [facility], [address], [place], [place] [address]    Culture   Final    NO GROWTH 5 DAYS Performed at [facility], [place] [address] [place], [place] [address]    Report Status 06/25/2022 FINAL  Final  Culture, blood (Routine x 2)     Status: None   Collection Time: 06/20/22 12:20 PM   Specimen: BLOOD RIGHT HAND  Result Value Ref Range Status   Specimen Description   Final    BLOOD RIGHT HAND Performed at [facility], [address] [place], [place] [address]    Special Requests   Final    BOTTLES DRAWN AEROBIC AND ANAEROBIC Blood Culture adequate volume Performed at [facility], [address], [place], [place] [address]    Culture   Final    NO GROWTH 5 DAYS Performed at [facility], [place] [address], [place], [place] [address]    Report Status 06/25/2022 FINAL  Final  Resp panel by RT-PCR (RSV, Flu A&B, Covid)  Anterior Nasal Swab     Status: None   Collection Time: 06/20/22  1:43 PM   Specimen: Anterior Nasal Swab  Result Value Ref Range Status   SARS Coronavirus 2 by RT PCR NEGATIVE NEGATIVE Final    Comment: (NOTE) SARS-CoV-2 target nucleic acids are NOT DETECTED.  The SARS-CoV-2 RNA is generally detectable in upper respiratory specimens during the acute phase of infection. The lowest concentration of SARS-CoV-2 viral copies this assay can detect is 138 copies/mL. A negative result does not preclude SARS-Cov-2 infection and should not be used as the sole basis for treatment or other patient management decisions. A negative result may occur with  improper specimen collection/handling, submission of specimen other than nasopharyngeal swab, presence of viral mutation(s) within the areas targeted by this assay, and inadequate number of viral copies(<138 copies/mL). A negative result must be combined with clinical observations, patient history, and epidemiological information. The expected result is Negative.  Fact Sheet for Patients:  EntrepreneurPulse.com.au  Fact Sheet for Healthcare Providers:  IncredibleEmployment.be  This test is no t yet approved or cleared by the [place] FDA and  has been authorized for detection and/or diagnosis of SARS-CoV-2 by FDA under an Emergency Use Authorization (EUA). This EUA will remain  in effect (meaning this test can be used) for the duration of the COVID-19 declaration under Section 564(b)(1) of the Act, 21 U.S.C.section 360bbb-3(b)(1), unless the authorization is terminated  or revoked sooner.       Influenza A by PCR NEGATIVE NEGATIVE Final   Influenza B by PCR NEGATIVE NEGATIVE Final    Comment: (NOTE) The Xpert Xpress SARS-CoV-2/FLU/RSV plus assay is intended as an aid in the diagnosis of influenza from Nasopharyngeal swab specimens and should not be used as a sole basis for treatment. Nasal washings  and aspirates are unacceptable for Xpert Xpress SARS-CoV-2/FLU/RSV testing.  Fact Sheet for Patients: EntrepreneurPulse.com.au  Fact Sheet for Healthcare Providers: IncredibleEmployment.be  This test is not yet approved or cleared by the [place] FDA and has been authorized for detection and/or diagnosis of SARS-CoV-2 by FDA under an Emergency Use Authorization (EUA). This EUA will remain in effect (meaning this test can be used) for the duration of the COVID-19 declaration under Section 564(b)(1) of the Act, 21 U.S.C. section 360bbb-3(b)(1), unless the authorization is terminated or revoked.     Resp Syncytial  Virus by PCR NEGATIVE NEGATIVE Final    Comment: (NOTE) Fact Sheet for Patients: EntrepreneurPulse.com.au  Fact Sheet for Healthcare Providers: IncredibleEmployment.be  This test is not yet approved or cleared by the [place] FDA and has been authorized for detection and/or diagnosis of SARS-CoV-2 by FDA under an Emergency Use Authorization (EUA). This EUA will remain in effect (meaning this test can be used) for the duration of the COVID-19 declaration under Section 564(b)(1) of the Act, 21 U.S.C. section 360bbb-3(b)(1), unless the authorization is terminated or revoked.  Performed at [facility], [place], [place] [address]   C Difficile Quick Screen w PCR reflex     Status: None   Collection Time: 06/21/22  9:09 AM  Result Value Ref Range Status   C Diff antigen NEGATIVE NEGATIVE Final   C Diff toxin NEGATIVE NEGATIVE Final   C Diff interpretation No C. difficile detected.  Final    Comment: Performed at [facility], [place] [address] [place], [place] [address]  Gastrointestinal Panel by PCR , Stool     Status: Abnormal   Collection Time: 06/21/22  9:09 AM  Result Value Ref Range Status   Campylobacter species NOT DETECTED NOT  DETECTED Final   Plesimonas shigelloides NOT DETECTED NOT DETECTED Final   Salmonella species NOT DETECTED NOT DETECTED Final   Yersinia enterocolitica NOT DETECTED NOT DETECTED Final   Vibrio species NOT DETECTED NOT DETECTED Final   Vibrio cholerae NOT DETECTED NOT DETECTED Final   Enteroaggregative E coli (EAEC) NOT DETECTED NOT DETECTED Final   Enteropathogenic E coli (EPEC) NOT DETECTED NOT DETECTED Final   Enterotoxigenic E coli (ETEC) NOT DETECTED NOT DETECTED Final   Shiga like toxin producing E coli (STEC) NOT DETECTED NOT DETECTED Final   Shigella/Enteroinvasive E coli (EIEC) NOT DETECTED NOT DETECTED Final   Cryptosporidium NOT DETECTED NOT DETECTED Final   Cyclospora cayetanensis NOT DETECTED NOT DETECTED Final   Entamoeba histolytica NOT DETECTED NOT DETECTED Final   Giardia lamblia NOT DETECTED NOT DETECTED Final   Adenovirus F40/41 NOT DETECTED NOT DETECTED Final   Astrovirus NOT DETECTED NOT DETECTED Final   Norovirus GI/GII DETECTED (A) NOT DETECTED Final    Comment: RESULT CALLED TO, READ BACK BY AND VERIFIED WITH: Margarita Mail RN '@[id number]'$  06/22/22 ASW    Rotavirus A NOT DETECTED NOT DETECTED Final   Sapovirus (I, II, IV, and V) NOT DETECTED NOT DETECTED Final    Comment: Performed at [facility], [address] [place], [place] [address]         Radiology Studies: ECHOCARDIOGRAM COMPLETE  Result Date: 06/27/2022    ECHOCARDIOGRAM REPORT   Patient Name:   Brandi Stephens Date of Exam: 06/27/2022 Medical Rec #:  [id number]        Height:       67.5 in Accession #:    [id number]       Weight:       191.0 lb Date of Birth:  April 14, 1946        BSA:          1.994 m Patient Age:    76 years         BP:           100/61 mmHg Patient Gender: F                HR:           93 bpm. Exam Location:  Inpatient Procedure:  2D Echo, Cardiac Doppler and Color Doppler Indications:    syncope  History:        Patient has prior history of Echocardiogram examinations. CHF,                  COPD; Risk Factors:Hypertension and Dyslipidemia.  Sonographer:    Phineas Douglas Referring Phys: [id number] [place]  1. Left ventricular ejection fraction, by estimation, is 60 to 65%. The left ventricle has normal function. The left ventricle has no regional wall motion abnormalities. There is moderate left ventricular hypertrophy. Left ventricular diastolic parameters are consistent with Grade I diastolic dysfunction (impaired relaxation).  2. Right ventricular systolic function is normal. The right ventricular size is normal. There is normal pulmonary artery systolic pressure.  3. The mitral valve is normal in structure. No evidence of mitral valve regurgitation. No evidence of mitral stenosis.  4. The aortic valve was not well visualized. Aortic valve regurgitation is not visualized. No aortic stenosis is present.  5. Aortic dilatation noted. There is borderline dilatation of the aortic root, measuring 39 mm. There is mild dilatation of the ascending aorta, measuring 40 mm.  6. The inferior vena cava is normal in size with <50% respiratory variability, suggesting right atrial pressure of 8 mmHg. FINDINGS  Left Ventricle: Left ventricular ejection fraction, by estimation, is 60 to 65%. The left ventricle has normal function. The left ventricle has no regional wall motion abnormalities. The left ventricular internal cavity size was normal in size. There is  moderate left ventricular hypertrophy. Left ventricular diastolic parameters are consistent with Grade I diastolic dysfunction (impaired relaxation). Right Ventricle: The right ventricular size is normal. No increase in right ventricular wall thickness. Right ventricular systolic function is normal. There is normal pulmonary artery systolic pressure. The tricuspid regurgitant velocity is 2.35 m/s, and  with an assumed right atrial pressure of 3 mmHg, the estimated right ventricular systolic pressure is [id number] mmHg. Left Atrium: Left atrial  size was normal in size. Right Atrium: Right atrial size was normal in size. Pericardium: There is no evidence of pericardial effusion. Mitral Valve: The mitral valve is normal in structure. No evidence of mitral valve regurgitation. No evidence of mitral valve stenosis. Tricuspid Valve: The tricuspid valve is normal in structure. Tricuspid valve regurgitation is trivial. No evidence of tricuspid stenosis. Aortic Valve: The aortic valve was not well visualized. Aortic valve regurgitation is not visualized. No aortic stenosis is present. Pulmonic Valve: The pulmonic valve was normal in structure. Pulmonic valve regurgitation is not visualized. No evidence of pulmonic stenosis. Aorta: Aortic dilatation noted. There is borderline dilatation of the aortic root, measuring 39 mm. There is mild dilatation of the ascending aorta, measuring 40 mm. Venous: The inferior vena cava is normal in size with less than 50% respiratory variability, suggesting right atrial pressure of 8 mmHg. IAS/Shunts: No atrial level shunt detected by color flow Doppler.  LEFT VENTRICLE PLAX 2D LVIDd:         4.90 cm     Diastology LVIDs:         3.65 cm     LV e' medial:    6.85 cm/s LV PW:         1.80 cm     LV E/e' medial:  8.5 LV IVS:        1.60 cm     LV e' lateral:   7.29 cm/s LVOT diam:     2.10 cm     LV E/e'  lateral: 8.0 LV SV:         71 LV SV Index:   36 LVOT Area:     3.46 cm  LV Volumes (MOD) LV vol d, MOD A2C: 87.6 ml LV vol d, MOD A4C: 95.4 ml LV vol s, MOD A2C: 34.4 ml LV vol s, MOD A4C: 41.0 ml LV SV MOD A2C:     53.2 ml LV SV MOD A4C:     95.4 ml LV SV MOD BP:      55.5 ml RIGHT VENTRICLE             IVC RV Basal diam:  4.00 cm     IVC diam: 1.90 cm RV S prime:     11.30 cm/s TAPSE (M-mode): 1.7 cm LEFT ATRIUM             Index        RIGHT ATRIUM           Index LA diam:        3.70 cm 1.86 cm/m   RA Area:     18.20 cm LA Vol (A2C):   54.4 ml 27.28 ml/m  RA Volume:   53.00 ml  26.58 ml/m LA Vol (A4C):   45.8 ml 22.97 ml/m  LA Biplane Vol: 53.3 ml 26.73 ml/m  AORTIC VALVE LVOT Vmax:   104.00 cm/s LVOT Vmean:  63.000 cm/s LVOT VTI:    0.205 m  AORTA Ao Root diam: 3.90 cm Ao Asc diam:  4.00 cm MITRAL VALVE               TRICUSPID VALVE MV Area (PHT): 4.17 cm    TR Peak grad:   22.1 mmHg MV Decel Time: 182 msec    TR Vmax:        235.00 cm/s MV E velocity: 58.30 cm/s MV A velocity: 87.80 cm/s  SHUNTS MV E/A ratio:  0.66        Systemic VTI:  0.20 m                            Systemic Diam: 2.10 cm Glori Bickers MD Electronically signed by Glori Bickers MD Signature Date/Time: 06/27/2022/3:46:58 PM    Final    VAS US CAROTID  Result Date: 06/27/2022 Carotid Arterial Duplex Study Patient Name:  Brandi Stephens  Date of Exam:   06/27/2022 Medical Rec #: [id number]         Accession #:    [id number] Date of Birth: [birth year]         Patient Gender: F Patient Age:   69 years Exam Location:  [facility] Procedure:      VAS US CAROTID Referring Phys: Niel Hummer --------------------------------------------------------------------------------  Indications:      Bilateral bruits and Syncope. Risk Factors:     Hypertension, hyperlipidemia, Diabetes, past history of                   smoking. Other Factors:    CHF. Comparison Study: No previous exams Performing Technologist: Jody Hill RVT, RDMS  Examination Guidelines: A complete evaluation includes B-mode imaging, spectral Doppler, color Doppler, and power Doppler as needed of all accessible portions of each vessel. Bilateral testing is considered an integral part of a complete examination. Limited examinations for reoccurring indications may be performed as noted.  Right Carotid Findings: +----------+--------+--------+--------+------------------+------------------+           PSV cm/sEDV cm/sStenosisPlaque DescriptionComments           +----------+--------+--------+--------+------------------+------------------+  CCA Prox  80      13                                 intimal thickening +----------+--------+--------+--------+------------------+------------------+ CCA Distal67      21                                intimal thickening +----------+--------+--------+--------+------------------+------------------+ ICA Prox  66      19              calcific                             +----------+--------+--------+--------+------------------+------------------+ ICA Distal48      21                                                   +----------+--------+--------+--------+------------------+------------------+ ECA       77      11                                                   +----------+--------+--------+--------+------------------+------------------+ +----------+--------+-------+----------------+-------------------+           PSV cm/sEDV cmsDescribe        Arm Pressure (mmHG) +----------+--------+-------+----------------+-------------------+ [id number]            Multiphasic, WNL                    +----------+--------+-------+----------------+-------------------+ +---------+--------+--+--------+-+---------+ VertebralPSV cm/s28EDV cm/s9Antegrade +---------+--------+--+--------+-+---------+  Left Carotid Findings: +----------+--------+--------+--------+------------------+------------------+           PSV cm/sEDV cm/sStenosisPlaque DescriptionComments           +----------+--------+--------+--------+------------------+------------------+ CCA Prox  72      17                                intimal thickening +----------+--------+--------+--------+------------------+------------------+ CCA Distal60      19                                intimal thickening +----------+--------+--------+--------+------------------+------------------+ ICA Prox  61      27                                tortuous           +----------+--------+--------+--------+------------------+------------------+ ICA Distal54      19                                                    +----------+--------+--------+--------+------------------+------------------+ ECA       76      11                                                   +----------+--------+--------+--------+------------------+------------------+ +----------+--------+--------+----------------+-------------------+  PSV cm/sEDV cm/sDescribe        Arm Pressure (mmHG) +----------+--------+--------+----------------+-------------------+ [id number]             Multiphasic, WNL                    +----------+--------+--------+----------------+-------------------+ +---------+--------+--+--------+--+---------+ VertebralPSV cm/s43EDV cm/s17Antegrade +---------+--------+--+--------+--+---------+   Summary: Right Carotid: The extracranial vessels were near-normal with only minimal wall                thickening or plaque. Left Carotid: The extracranial vessels were near-normal with only minimal wall               thickening or plaque. Vertebrals:  Bilateral vertebral arteries demonstrate antegrade flow. Subclavians: Normal flow hemodynamics were seen in bilateral subclavian              arteries. *See table(s) above for measurements and observations.  Electronically signed by Antony Contras MD on 06/27/2022 at 11:13:08 AM.    Final         Scheduled Meds:  allopurinol  300 mg Oral Daily   amitriptyline  100 mg Oral QHS   atorvastatin  80 mg Oral Daily   cefadroxil  500 mg Oral BID   DULoxetine  60 mg Oral Daily   enoxaparin (LOVENOX) injection  40 mg Subcutaneous Q24H   feeding supplement  237 mL Oral TID BM   insulin aspart  0-9 Units Subcutaneous Q4H   levothyroxine  125 mcg Oral Q0600   midodrine  10 mg Oral TID WC   pantoprazole  40 mg Oral BID   primidone  50 mg Oral BID   topiramate  50 mg Oral QHS   Continuous Infusions:  sodium chloride Stopped (06/20/22 1800)   dextrose 5% lactated ringers 100 mL/hr at 06/28/22 1131     LOS: 8 days     Time spent: 35 minutes    Codee Bloodworth A Solash Tullo, MD [facility]   If 7PM-7AM, please contact night-coverage [email or web address]  06/28/2022, 2:34 PM

## 2022-06-29 LAB — GLUCOSE, CAPILLARY
Glucose-Capillary: 102 mg/dL — ABNORMAL HIGH (ref 70–99)
Glucose-Capillary: 112 mg/dL — ABNORMAL HIGH (ref 70–99)
Glucose-Capillary: 82 mg/dL (ref 70–99)
Glucose-Capillary: 94 mg/dL (ref 70–99)

## 2022-06-29 MED ORDER — MIDODRINE HCL 10 MG PO TABS: 10.0000 mg | ORAL_TABLET | Freq: Three times a day (TID) | ORAL | 1 refills | Status: DC

## 2022-06-29 MED ORDER — LEVOTHYROXINE SODIUM 100 MCG PO TABS: 100.0000 ug | ORAL_TABLET | Freq: Every day | ORAL | 2 refills | Status: DC

## 2022-06-29 MED ORDER — PANTOPRAZOLE SODIUM 40 MG PO TBEC: 40.0000 mg | DELAYED_RELEASE_TABLET | Freq: Every day | ORAL | 0 refills | Status: DC

## 2022-06-29 NOTE — TOC Progression Note (Signed)
Transition of Care [facility]) - Progression Note    Patient Details  Name: Brandi Stephens MRN: [id number] Date of Birth: Mar 01, 1946  Transition of Care [facility]) CM/SW Contact  Purcell Mouton, RN Phone Number: 06/29/2022, 12:24 PM  Clinical Narrative:    Spoke with pt concerning HH/RN/PT. Pt asked that her daughter [name] be called. [name] agreed with [facility] for [facility]. Referral given to [facility] with [place].    Expected Discharge Plan: [place] Barriers to Discharge: No Barriers Identified  Expected Discharge Plan and Services     Post Acute Care Choice: [place] arrangements for the past 2 months: Single Family Home Expected Discharge Date: 06/29/22                                     Social Determinants of Health (SDOH) Interventions SDOH Screenings   Food Insecurity: No Food Insecurity (06/20/2022)  Housing: Low Risk  (06/20/2022)  Transportation Needs: No Transportation Needs (06/20/2022)  Utilities: Not At Risk (06/20/2022)  Depression (PHQ2-9): Low Risk  (05/17/2022)  Financial Resource Strain: Low Risk  (03/18/2022)  Physical Activity: Inactive (03/18/2022)  Stress: No Stress Concern Present (03/18/2022)  Tobacco Use: Medium Risk (06/20/2022)    Readmission Risk Interventions    06/23/2022   10:55 AM  Readmission Risk Prevention Plan  Transportation Screening Complete  PCP or Specialist Appt within 3-5 Days Complete  HRI or [place] Complete  Social Work Consult for [place] Planning/Counseling Complete  Palliative Care Screening Not Applicable

## 2022-06-29 NOTE — Discharge Summary (Signed)
[facility]  Physician Discharge Summary   Patient ID: Brandi Stephens MRN: [id number] DOB/AGE: 05-13-1945 77 y.o.  Admit date: 06/20/2022 Discharge date: 06/29/2022    PCP: Carlena Hurl, PA-C  DISCHARGE DIAGNOSES:    Severe sepsis with acute organ dysfunction ([place])   AKI (acute kidney injury) ([place])   Essential hypertension   Type II diabetes mellitus with complication ([place])   RECOMMENDATIONS FOR OUTPATIENT FOLLOW UP: Patient encouraged to follow-up with her outpatient providers. Metoprolol and Bumex placed on hold due to orthostatic hypotension.   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  She has lost about 100 pounds in the last year as she was a started on Ozempic.  She presented to the ED with 2 weeks history of nausea, vomiting and diarrhea.  She also reports some abdominal discomfort.  She was found to have lactic acidosis, lactic acid up to 2.9.  She was hypotensive, improved with IV fluids.  He was admitted for severe sepsis. GI Pathogen positive for norovirus.   Sepsis has resolved.  Norovirus resolved.  Patient was also found to have orthostatic hypotension of unclear etiology, with a component of dehydration.  She was a started on midodrine and dose titrated to 10 mg 3 times daily.  She was a started Ted hose  and abdominal binder.  2D echo and carotid Doppler negative.  Consultations: Cardiology    HOSPITAL COURSE:   Orthostatic Hypotension, recurrent Syncope: Received IV fluids, and IV bolus.  Cosyntropin test rule out adrenal insufficiency, cortisol at 19--21  Cardiology was consulted.  She was placed on TED hoses.  She was started on midodrine with improvement in symptoms.   Echocardiogram did not show any significant abnormalities.  Carotid Doppler did not show any significant stenosis.  Seen by physical therapy.  Home health has been ordered.   Severe Sepsis, could be related to norovirus infection: resolved.  -Presented with hypotension, tachycardia, tachypnea, lactic acidosis. 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 CT negative for infection.  -CT abdomen and pelvis: Shows stool burden, esophagitis -C. difficile negative, GI pathogen positive for norovirus -Blood cultures: No growth to date,    Acute kidney injury: Creatinine baseline 0.9 presented with a creatinine of 1.3 in the setting of hypovolemia.   Treated with IV fluids   Nausea, vomiting -Suspect related to norovirus -Continue with  IV Protonix, as needed Zofran   Hypokalemia; Replaced.    Hypomagnesemia;Replaced.   Chronic diastolic heart failure: Continue to hold Bumex at discharge due to orthostatic hypotension.  Can be addressed in the outpatient setting.  History of lumbar spine infection On chronic antibiotics suppression.  ID recommend Repeat CT Lumbar spine. CT spine L 2 loose screw, T 12 compression fracture. Discussed CT finding with Neurosurgery Dr Merdis Delay, who thinks CT funding are chronic. He doesn't see significant change for infection.   Patient is stable.  Okay for discharge home today.   PERTINENT LABS:  The results of significant diagnostics from this hospitalization (including imaging, microbiology, ancillary and laboratory) are listed below for reference.    Microbiology: Recent Results (from the past 240 hour(s))  Culture, blood (Routine x 2)     Status: None   Collection Time:  06/20/22 12:20 PM   Specimen: BLOOD RIGHT HAND  Result Value Ref Range Status   Specimen Description   Final    BLOOD RIGHT HAND Performed at [facility], [address]  [place], [place] [address]    Special Requests   Final    BOTTLES DRAWN AEROBIC AND ANAEROBIC Blood Culture adequate volume Performed at [facility], [address] [place], [place] [address]    Culture   Final    NO GROWTH 5 DAYS Performed at [facility], [place] [address] [place], [place] [address]    Report Status 06/25/2022 FINAL  Final  Resp panel by RT-PCR (RSV, Flu A&B, Covid) Anterior Nasal Swab     Status: None   Collection Time: 06/20/22  1:43 PM   Specimen: Anterior Nasal Swab  Result Value Ref Range Status   SARS Coronavirus 2 by RT PCR NEGATIVE NEGATIVE Final    Comment: (NOTE) SARS-CoV-2 target nucleic acids are NOT DETECTED.  The SARS-CoV-2 RNA is generally detectable in upper respiratory specimens during the acute phase of infection. The lowest concentration of SARS-CoV-2 viral copies this assay can detect is 138 copies/mL. A negative result does not preclude SARS-Cov-2 infection and should not be used as the sole basis for treatment or other patient management decisions. A negative result may occur with  improper specimen collection/handling, submission of specimen other than nasopharyngeal swab, presence of viral mutation(s) within the areas targeted by this assay, and inadequate number of viral copies(<138 copies/mL). A negative result must be combined with clinical observations, patient history, and epidemiological information. The expected result is Negative.  Fact Sheet for Patients:  EntrepreneurPulse.com.au  Fact Sheet for Healthcare Providers:  IncredibleEmployment.be  This test is no t yet approved or cleared by the [place] FDA and  has been authorized for detection and/or diagnosis of SARS-CoV-2 by FDA under an Emergency Use Authorization (EUA). This EUA will remain  in effect (meaning this test can be used) for the duration of the COVID-19 declaration under Section 564(b)(1) of the Act,  21 U.S.C.section 360bbb-3(b)(1), unless the authorization is terminated  or revoked sooner.       Influenza A by PCR NEGATIVE NEGATIVE Final   Influenza B by PCR NEGATIVE NEGATIVE Final    Comment: (NOTE) The Xpert Xpress SARS-CoV-2/FLU/RSV plus assay is intended as an aid in the diagnosis of influenza from Nasopharyngeal swab specimens and should not be used as a sole basis for treatment. Nasal washings and aspirates are unacceptable for Xpert Xpress SARS-CoV-2/FLU/RSV testing.  Fact Sheet for Patients: EntrepreneurPulse.com.au  Fact Sheet for Healthcare Providers: IncredibleEmployment.be  This test is not yet approved or cleared by the [place] FDA and has been authorized for detection and/or diagnosis of SARS-CoV-2 by FDA under an Emergency Use Authorization (EUA). This EUA will remain in effect (meaning this test can be used) for the duration of the COVID-19 declaration under Section 564(b)(1) of the Act, 21 U.S.C. section 360bbb-3(b)(1), unless the authorization is terminated or revoked.     Resp Syncytial Virus by PCR NEGATIVE NEGATIVE Final    Comment: (NOTE) Fact Sheet for Patients: EntrepreneurPulse.com.au  Fact Sheet for Healthcare Providers: IncredibleEmployment.be  This test is not yet approved or cleared by the [place] FDA and has been authorized for detection and/or diagnosis of SARS-CoV-2 by FDA under an Emergency Use Authorization (EUA). This EUA will remain in effect (meaning this test can be used) for the duration of the COVID-19 declaration under Section 564(b)(1)  of the Act, 21 U.S.C. section 360bbb-3(b)(1), unless the authorization is terminated or revoked.  Performed at [facility], [place], [place] [address]   C Difficile Quick Screen w PCR reflex     Status: None   Collection Time: 06/21/22  9:09 AM  Result Value Ref Range  Status   C Diff antigen NEGATIVE NEGATIVE Final   C Diff toxin NEGATIVE NEGATIVE Final   C Diff interpretation No C. difficile detected.  Final    Comment: Performed at [facility], [place] [address] [place] [address] [place], [place] [address]  Gastrointestinal Panel by PCR , Stool     Status: Abnormal   Collection Time: 06/21/22  9:09 AM  Result Value Ref Range Status   Campylobacter species NOT DETECTED NOT DETECTED Final   Plesimonas shigelloides NOT DETECTED NOT DETECTED Final   Salmonella species NOT DETECTED NOT DETECTED Final   Yersinia enterocolitica NOT DETECTED NOT DETECTED Final   Vibrio species NOT DETECTED NOT DETECTED Final   Vibrio cholerae NOT DETECTED NOT DETECTED Final   Enteroaggregative E coli (EAEC) NOT DETECTED NOT DETECTED Final   Enteropathogenic E coli (EPEC) NOT DETECTED NOT DETECTED Final   Enterotoxigenic E coli (ETEC) NOT DETECTED NOT DETECTED Final   Shiga like toxin producing E coli (STEC) NOT DETECTED NOT DETECTED Final   Shigella/Enteroinvasive E coli (EIEC) NOT DETECTED NOT DETECTED Final   Cryptosporidium NOT DETECTED NOT DETECTED Final   Cyclospora cayetanensis NOT DETECTED NOT DETECTED Final   Entamoeba histolytica NOT DETECTED NOT DETECTED Final   Giardia lamblia NOT DETECTED NOT DETECTED Final   Adenovirus F40/41 NOT DETECTED NOT DETECTED Final   Astrovirus NOT DETECTED NOT DETECTED Final   Norovirus GI/GII DETECTED (A) NOT DETECTED Final    Comment: RESULT CALLED TO, READ BACK BY AND VERIFIED WITH: Margarita Mail RN '@[id number]'$  06/22/22 ASW    Rotavirus A NOT DETECTED NOT DETECTED Final   Sapovirus (I, II, IV, and V) NOT DETECTED NOT DETECTED Final    Comment: Performed at [facility], [place] [place], [place] [address]     Labs:   Basic Metabolic Panel: Recent Labs  Lab 06/25/22 0758 06/27/22 0337 06/28/22 0414  NA 139 135 137  K 3.6 3.5 3.6  CL 109 108 107  CO2 '22 25 24  '$ GLUCOSE 79 97 96  BUN 5* 8 10  CREATININE 0.83  0.62 0.79  CALCIUM 8.9 8.5* 8.7*  MG  --  1.5* 1.9   Liver Function Tests: Recent Labs  Lab 06/27/22 0337  AST 18  ALT 13  ALKPHOS 59  BILITOT 0.4  PROT 4.5*  ALBUMIN 2.5*    CBC: Recent Labs  Lab 06/27/22 0337  WBC 8.2  HGB 11.1*  HCT 34.6*  MCV 95.6  PLT 168     CBG: Recent Labs  Lab 06/28/22 1953 06/29/22 0043 06/29/22 0453 06/29/22 0741 06/29/22 1127  GLUCAP 71 112* 82 94 102*     IMAGING STUDIES ECHOCARDIOGRAM COMPLETE  Result Date: 06/27/2022    ECHOCARDIOGRAM REPORT   Patient Name:   Brandi Stephens Date of Exam: 06/27/2022 Medical Rec #:  [id number]        Height:       67.5 in Accession #:    [id number]       Weight:       191.0 lb Date of Birth:  12-22-1945        BSA:  1.994 m Patient Age:    23 years         BP:           100/61 mmHg Patient Gender: F                HR:           93 bpm. Exam Location:  Inpatient Procedure: 2D Echo, Cardiac Doppler and Color Doppler Indications:    syncope  History:        Patient has prior history of Echocardiogram examinations. CHF,                 COPD; Risk Factors:Hypertension and Dyslipidemia.  Sonographer:    Phineas Douglas Referring Phys: [id number] [place]  1. Left ventricular ejection fraction, by estimation, is 60 to 65%. The left ventricle has normal function. The left ventricle has no regional wall motion abnormalities. There is moderate left ventricular hypertrophy. Left ventricular diastolic parameters are consistent with Grade I diastolic dysfunction (impaired relaxation).  2. Right ventricular systolic function is normal. The right ventricular size is normal. There is normal pulmonary artery systolic pressure.  3. The mitral valve is normal in structure. No evidence of mitral valve regurgitation. No evidence of mitral stenosis.  4. The aortic valve was not well visualized. Aortic valve regurgitation is not visualized. No aortic stenosis is present.  5. Aortic dilatation noted. There is  borderline dilatation of the aortic root, measuring 39 mm. There is mild dilatation of the ascending aorta, measuring 40 mm.  6. The inferior vena cava is normal in size with <50% respiratory variability, suggesting right atrial pressure of 8 mmHg. FINDINGS  Left Ventricle: Left ventricular ejection fraction, by estimation, is 60 to 65%. The left ventricle has normal function. The left ventricle has no regional wall motion abnormalities. The left ventricular internal cavity size was normal in size. There is  moderate left ventricular hypertrophy. Left ventricular diastolic parameters are consistent with Grade I diastolic dysfunction (impaired relaxation). Right Ventricle: The right ventricular size is normal. No increase in right ventricular wall thickness. Right ventricular systolic function is normal. There is normal pulmonary artery systolic pressure. The tricuspid regurgitant velocity is 2.35 m/s, and  with an assumed right atrial pressure of 3 mmHg, the estimated right ventricular systolic pressure is [id number] mmHg. Left Atrium: Left atrial size was normal in size. Right Atrium: Right atrial size was normal in size. Pericardium: There is no evidence of pericardial effusion. Mitral Valve: The mitral valve is normal in structure. No evidence of mitral valve regurgitation. No evidence of mitral valve stenosis. Tricuspid Valve: The tricuspid valve is normal in structure. Tricuspid valve regurgitation is trivial. No evidence of tricuspid stenosis. Aortic Valve: The aortic valve was not well visualized. Aortic valve regurgitation is not visualized. No aortic stenosis is present. Pulmonic Valve: The pulmonic valve was normal in structure. Pulmonic valve regurgitation is not visualized. No evidence of pulmonic stenosis. Aorta: Aortic dilatation noted. There is borderline dilatation of the aortic root, measuring 39 mm. There is mild dilatation of the ascending aorta, measuring 40 mm. Venous: The inferior vena cava is normal  in size with less than 50% respiratory variability, suggesting right atrial pressure of 8 mmHg. IAS/Shunts: No atrial level shunt detected by color flow Doppler.  LEFT VENTRICLE PLAX 2D LVIDd:         4.90 cm     Diastology LVIDs:         3.65 cm  LV e' medial:    6.85 cm/s LV PW:         1.80 cm     LV E/e' medial:  8.5 LV IVS:        1.60 cm     LV e' lateral:   7.29 cm/s LVOT diam:     2.10 cm     LV E/e' lateral: 8.0 LV SV:         71 LV SV Index:   36 LVOT Area:     3.46 cm  LV Volumes (MOD) LV vol d, MOD A2C: 87.6 ml LV vol d, MOD A4C: 95.4 ml LV vol s, MOD A2C: 34.4 ml LV vol s, MOD A4C: 41.0 ml LV SV MOD A2C:     53.2 ml LV SV MOD A4C:     95.4 ml LV SV MOD BP:      55.5 ml RIGHT VENTRICLE             IVC RV Basal diam:  4.00 cm     IVC diam: 1.90 cm RV S prime:     11.30 cm/s TAPSE (M-mode): 1.7 cm LEFT ATRIUM             Index        RIGHT ATRIUM           Index LA diam:        3.70 cm 1.86 cm/m   RA Area:     18.20 cm LA Vol (A2C):   54.4 ml 27.28 ml/m  RA Volume:   53.00 ml  26.58 ml/m LA Vol (A4C):   45.8 ml 22.97 ml/m LA Biplane Vol: 53.3 ml 26.73 ml/m  AORTIC VALVE LVOT Vmax:   104.00 cm/s LVOT Vmean:  63.000 cm/s LVOT VTI:    0.205 m  AORTA Ao Root diam: 3.90 cm Ao Asc diam:  4.00 cm MITRAL VALVE               TRICUSPID VALVE MV Area (PHT): 4.17 cm    TR Peak grad:   22.1 mmHg MV Decel Time: 182 msec    TR Vmax:        235.00 cm/s MV E velocity: 58.30 cm/s MV A velocity: 87.80 cm/s  SHUNTS MV E/A ratio:  0.66        Systemic VTI:  0.20 m                            Systemic Diam: 2.10 cm Glori Bickers MD Electronically signed by Glori Bickers MD Signature Date/Time: 06/27/2022/3:46:58 PM    Final    VAS US CAROTID  Result Date: 06/27/2022 Carotid Arterial Duplex Study Patient Name:  Brandi Stephens  Date of Exam:   06/27/2022 Medical Rec #: [id number]         Accession #:    [id number] Date of Birth: [birth year]         Patient Gender: F Patient Age:   24 years Exam Location:  [facility] Procedure:      VAS US CAROTID Referring Phys: Niel Hummer --------------------------------------------------------------------------------  Indications:      Bilateral bruits and Syncope. Risk Factors:     Hypertension, hyperlipidemia, Diabetes, past history of                   smoking. Other Factors:    CHF. Comparison Study: No previous exams Performing Technologist: Jody Hill RVT, RDMS  Examination Guidelines: A complete evaluation includes B-mode imaging, spectral Doppler, color Doppler, and power Doppler as needed of all accessible portions of each vessel. Bilateral testing is considered an integral part of a complete examination. Limited examinations for reoccurring indications may be performed as noted.  Right Carotid Findings: +----------+--------+--------+--------+------------------+------------------+           PSV cm/sEDV cm/sStenosisPlaque DescriptionComments           +----------+--------+--------+--------+------------------+------------------+ CCA Prox  80      13                                intimal thickening +----------+--------+--------+--------+------------------+------------------+ CCA Distal67      21                                intimal thickening +----------+--------+--------+--------+------------------+------------------+ ICA Prox  66      19              calcific                             +----------+--------+--------+--------+------------------+------------------+ ICA Distal48      21                                                   +----------+--------+--------+--------+------------------+------------------+ ECA       77      11                                                   +----------+--------+--------+--------+------------------+------------------+ +----------+--------+-------+----------------+-------------------+           PSV cm/sEDV cmsDescribe        Arm Pressure (mmHG)  +----------+--------+-------+----------------+-------------------+ [id number]            Multiphasic, WNL                    +----------+--------+-------+----------------+-------------------+ +---------+--------+--+--------+-+---------+ VertebralPSV cm/s28EDV cm/s9Antegrade +---------+--------+--+--------+-+---------+  Left Carotid Findings: +----------+--------+--------+--------+------------------+------------------+           PSV cm/sEDV cm/sStenosisPlaque DescriptionComments           +----------+--------+--------+--------+------------------+------------------+ CCA Prox  72      17                                intimal thickening +----------+--------+--------+--------+------------------+------------------+ CCA Distal60      19                                intimal thickening +----------+--------+--------+--------+------------------+------------------+ ICA Prox  61      27                                tortuous           +----------+--------+--------+--------+------------------+------------------+ ICA Distal54      19                                                   +----------+--------+--------+--------+------------------+------------------+  ECA       76      11                                                   +----------+--------+--------+--------+------------------+------------------+ +----------+--------+--------+----------------+-------------------+           PSV cm/sEDV cm/sDescribe        Arm Pressure (mmHG) +----------+--------+--------+----------------+-------------------+ [id number]             Multiphasic, WNL                    +----------+--------+--------+----------------+-------------------+ +---------+--------+--+--------+--+---------+ VertebralPSV cm/s43EDV cm/s17Antegrade +---------+--------+--+--------+--+---------+   Summary: Right Carotid: The extracranial vessels were near-normal with only minimal wall                 thickening or plaque. Left Carotid: The extracranial vessels were near-normal with only minimal wall               thickening or plaque. Vertebrals:  Bilateral vertebral arteries demonstrate antegrade flow. Subclavians: Normal flow hemodynamics were seen in bilateral subclavian              arteries. *See table(s) above for measurements and observations.  Electronically signed by Antony Contras MD on 06/27/2022 at 11:13:08 AM.    Final    CT LUMBAR SPINE W CONTRAST  Result Date: 06/23/2022 CLINICAL DATA:  Chronic low back pain with sepsis EXAM: CT LUMBAR SPINE WITH CONTRAST TECHNIQUE: Multidetector CT imaging of the lumbar spine was performed with intravenous contrast administration. RADIATION DOSE REDUCTION: This exam was performed according to the departmental dose-optimization program which includes automated exposure control, adjustment of the mA and/or kV according to patient size and/or use of iterative reconstruction technique. CONTRAST:  18m OMNIPAQUE IOHEXOL 300 MG/ML  SOLN COMPARISON:  No prior dedicated CT of the lumbar spine, correlation is made with 06/20/2022 CT abdomen pelvis and 08/20/2021 MRI lumbar spine FINDINGS: Segmentation: 5 lumbar type vertebral bodies. Alignment: 5 mm retrolisthesis of L1 on L2, unchanged. 5 mm anterolisthesis of L2 on L3, unchanged. Vertebrae: Redemonstrated compression deformity of T12, with overall unchanged vertebral body height loss compared to the 08/20/2021 MRI, when accounting for differences in technique. Status post posterior fusion and decompression L1-S1, with significant lucency about the bilateral L1 screws (series 3, image 45). No other perihardware lucency. Beam hardening artifact from the hardware limits evaluation of these levels. Paraspinal and other soft tissues: Multiple renal cysts, for which no follow-up is currently indicated. Aortic atherosclerosis. Disc levels: T12-L1: Mild disc bulge. Mild facet arthropathy. No spinal canal stenosis or  neural foraminal narrowing. L1-L2: Retrolisthesis. Status post posterior fusion. Moderate facet arthropathy. No spinal canal stenosis. Moderate bilateral neural foraminal narrowing. L2-L3: Grade 1 anterolisthesis. Status post fusion and decompression. No spinal canal stenosis or neural foraminal narrowing. L3-L4: Status post fusion and decompression. Significant beam hardening artifact limits evaluation of this level. Right eccentric osteophyte formation likely narrows the right lateral recess. No definite spinal canal stenosis. Mild right neural foraminal narrowing. L4-L5: Status post fusion and decompression. Significant beam hardening artifact limits evaluation of this level. No definite spinal canal stenosis or neural foraminal narrowing. L5-S1: Status post fusion and decompression. Significant beam hardening artifact limits evaluation of this level. No spinal canal stenosis or neural foraminal narrowing. IMPRESSION: 1. Status post posterior fusion and decompression L1-S1, with significant lucency about the  bilateral L1 screws, concerning for loosening. Beam hardening artifact from the hardware limits evaluation of these levels, however no definite spinal canal stenosis is seen. 2. Redemonstrated compression deformity of T12, with likely similar vertebral body height loss compared to the 08/20/2021 MRI, when accounting for differences in technique. 3. Right eccentric osteophyte formation at L3-L4 likely narrows the right lateral recess. Mild right neural foraminal narrowing. 4. Moderate bilateral neural foraminal narrowing at L1-L2. 5. Aortic atherosclerosis. Aortic Atherosclerosis (ICD10-I70.0). Electronically Signed   By: Merilyn Baba M.D.   On: 06/23/2022 20:19   CT ABDOMEN PELVIS W CONTRAST  Result Date: 06/20/2022 CLINICAL DATA:  Bowel obstruction suspected N/V for 2 weeks, hx of lumbar osteomyelitis EXAM: CT ABDOMEN AND PELVIS WITH CONTRAST TECHNIQUE: Multidetector CT imaging of the abdomen and pelvis  was performed using the standard protocol following bolus administration of intravenous contrast. RADIATION DOSE REDUCTION: This exam was performed according to the departmental dose-optimization program which includes automated exposure control, adjustment of the mA and/or kV according to patient size and/or use of iterative reconstruction technique. CONTRAST:  23m OMNIPAQUE IOHEXOL 350 MG/ML SOLN COMPARISON:  CT 07/09/2019 FINDINGS: Lower chest: Lingular scarring/subsegmental atelectasis. Unchanged subendocardial hypoattenuation/fat density in the left ventricular septum and in the right ventricle. Hepatobiliary: No focal liver abnormality is seen. The gallbladder is unremarkable. Pancreas: Unremarkable. No pancreatic ductal dilatation or surrounding inflammatory changes. Spleen: Normal in size without focal abnormality. Adrenals/Urinary Tract: Adrenal glands are unremarkable. No hydronephrosis or nephroureterolithiasis. Small bilateral renal cysts which appear simple require no follow-up imaging. Bladder is minimally distended. Stomach/Bowel: There is distal esophageal wall thickening. The stomach is within normal limits. There is no evidence of bowel obstruction.Prior appendectomy. Moderate to large rectal stool burden. Sigmoid diverticulosis. No acute diverticulitis. Vascular/Lymphatic: Aortoiliac atherosclerosis. No AAA. No lymphadenopathy. Reproductive: Prior hysterectomy. Other: No abdominal wall hernia or abnormality. No abdominopelvic ascites. Musculoskeletal: No acute osseous abnormality. No suspicious osseous lesion. There is a chronic compression deformity of T12 without evidence of progressive height loss. Prior fusion from L1-S1 with unchanged mild residual retrolisthesis at L1-L2. There is mild lucency along the L1 pedicle screws. Hardware is intact. IMPRESSION: Distal esophageal wall thickening, may suggest esophagitis. Moderate to large rectal stool burden. No bowel obstruction or other acute  findings in the abdomen or pelvis. Chronic compression deformity of T12 without evidence of progressive height loss. Prior fusion from L1-S1 with unchanged mild residual retrolisthesis at L1-L2. Mild lucency along the L1 pedicle screws. Electronically Signed   By: JMaurine SimmeringM.D.   On: 06/20/2022 13:52   DG Chest Port 1 View  Result Date: 06/20/2022 CLINICAL DATA:  Vomiting for 2 weeks. EXAM: PORTABLE CHEST 1 VIEW COMPARISON:  CT chest and chest x-ray dated January 18, 2021. FINDINGS: New loop recorder noted. Heart remains at the upper limits of normal in size. Normal pulmonary vascularity. Chronic emphysematous changes and mildly coarsened interstitial markings are similar. No focal consolidation, pleural effusion, or pneumothorax. No acute osseous abnormality. IMPRESSION: 1. No active disease. 2. COPD. Electronically Signed   By: WTitus DubinM.D.   On: 06/20/2022 12:49    DISCHARGE EXAMINATION: Vitals:   06/28/22 2115 06/29/22 0439 06/29/22 1018 06/29/22 1022  BP: (!) 148/89 138/80 115/77   Pulse: 92 85 (!) 121 86  Resp:  '16 18 18  '$ Temp:  97.6 F (36.4 C)  97.7 F (36.5 C)  TempSrc:  Oral    SpO2:  94%  (!) 66%  Weight:      Height:  General appearance: Awake alert.  In no distress Resp: Clear to auscultation bilaterally.  Normal effort Cardio: S1-S2 is normal regular.  No S3-S4.  No rubs murmurs or bruit GI: Abdomen is soft.  Nontender nondistended.  Bowel sounds are present normal.  No masses organomegaly   DISPOSITION: Home  Discharge Instructions     Call MD for:  difficulty breathing, headache or visual disturbances   Complete by: As directed    Call MD for:  extreme fatigue   Complete by: As directed    Call MD for:  persistant dizziness or light-headedness   Complete by: As directed    Call MD for:  persistant nausea and vomiting   Complete by: As directed    Call MD for:  redness, tenderness, or signs of infection (pain, swelling, redness, odor or green/yellow  discharge around incision site)   Complete by: As directed    Call MD for:  temperature >100.4   Complete by: As directed    Diet - low sodium heart healthy   Complete by: As directed    Discharge instructions   Complete by: As directed    Please get up slowly from a sitting or lying position as discussed in detail this morning.  Please be sure to follow-up with your primary care provider in 1 week.  Home health will be ordered.  Take your medications as prescribed.  Your blood pressure and diuretic medications have been stopped due to orthostatic hypotension.  You were cared for by a hospitalist during your hospital stay. If you have any questions about your discharge medications or the care you received while you were in the hospital after you are discharged, you can call the unit and asked to speak with the hospitalist on call if the hospitalist that took care of you is not available. Once you are discharged, your primary care physician will handle any further medical issues. Please note that NO REFILLS for any discharge medications will be authorized once you are discharged, as it is imperative that you return to your primary care physician (or establish a relationship with a primary care physician if you do not have one) for your aftercare needs so that they can reassess your need for medications and monitor your lab values. If you do not have a primary care physician, you can call [phone] for a physician referral.   Increase activity slowly   Complete by: As directed           Allergies as of 06/29/2022       Reactions   Ampicillin Hives, Other (See Comments)   Severe reaction in February 2017 1.5 month to have hives to go away   Gabapentin Swelling   UNSPECIFIED REACTION    Penicillins Hives, Other (See Comments)   Severe reaction in February 2017 1.5 month to have hives to go away Has patient had a PCN reaction causing immediate rash, facial/tongue/throat swelling, SOB or  lightheadedness with hypotension: #  #  #  YES  #  #  #  Has patient had a PCN reaction causing severe rash involving mucus membranes or skin necrosis: No Has patient had a PCN reaction that required hospitalization No Has patient had a PCN reaction occurring within the last 10 years: No   Other Itching   UNSPECIFIED Analgesics        Medication List     STOP taking these medications    bumetanide 1 MG tablet Commonly known as: BUMEX   metoprolol  tartrate 25 MG tablet Commonly known as: LOPRESSOR       TAKE these medications    allopurinol 300 MG tablet Commonly known as: ZYLOPRIM Take 300 mg by mouth daily.   amitriptyline 100 MG tablet Commonly known as: ELAVIL Take 1 tablet (100 mg total) by mouth at bedtime.   atorvastatin 80 MG tablet Commonly known as: LIPITOR Take 1 tablet (80 mg total) by mouth daily.   Biotin 3 MG Tabs Take 3 mg by mouth daily.   blood glucose meter kit and supplies Kit Dispense based on patient and insurance preference. Use up to four times daily as directed.   cefadroxil 500 MG capsule Commonly known as: DURICEF Take 500 mg by mouth 2 (two) times daily.   colchicine 0.6 MG tablet Take 2 tablets at once then take 1 tablet an hour later then then 1 tablet twice daily x 3 days. What changed:  how much to take how to take this when to take this reasons to take this additional instructions   diazepam 10 MG tablet Commonly known as: VALIUM TAKE 1 TABLET(10 MG) BY MOUTH EVERY 6 HOURS AS NEEDED FOR ANXIETY What changed: See the new instructions.   DULoxetine 60 MG capsule Commonly known as: CYMBALTA TAKE 1 CAPSULE(60 MG) BY MOUTH DAILY   FreeStyle Libre 3 Sensor Misc Place 1 sensor on the skin every 14 days. Use to check glucose continuously   HYDROcodone-acetaminophen 5-325 MG tablet Commonly known as: NORCO/VICODIN Take 1 tablet by mouth every 4 (four) hours as needed for moderate pain.   levothyroxine 100 MCG  tablet Commonly known as: Synthroid Take 1 tablet (100 mcg total) by mouth daily. What changed:  medication strength See the new instructions.   Magnesium Gluconate 250 MG Tabs Take 1 tablet by mouth daily.   midodrine 10 MG tablet Commonly known as: PROAMATINE Take 1 tablet (10 mg total) by mouth 3 (three) times daily with meals.   OXYGEN Inhale 2 L into the lungs.   pantoprazole 40 MG tablet Commonly known as: PROTONIX Take 1 tablet (40 mg total) by mouth daily for 14 days.   polyethylene glycol powder 17 GM/SCOOP powder Commonly known as: GLYCOLAX/MIRALAX Take 17 g by mouth daily as needed for moderate constipation.   primidone 50 MG tablet Commonly known as: MYSOLINE TAKE 1 TABLET BY MOUTH TWICE DAILY   PROBIOTIC DAILY PO Take 1 tablet by mouth daily.   rOPINIRole 1 MG tablet Commonly known as: REQUIP TAKE 1 TABLET(1 MG) BY MOUTH AT BEDTIME   Semaglutide (1 MG/DOSE) 4 MG/3ML Sopn Inject 1 mg as directed once a week.   SYSTANE BALANCE OP Place 1 drop into both eyes 4 (four) times daily as needed (dry eyes).   topiramate 50 MG tablet Commonly known as: Topamax Take 1 tablet (50 mg total) by mouth at bedtime.   [name] Ellipta 100-62.5-25 MCG/ACT Aepb Generic drug: Fluticasone-Umeclidin-Vilant Inhale 1 puff into the lungs daily.   triamcinolone ointment 0.1 % Commonly known as: KENALOG Apply 1 Application topically 2 (two) times daily.   Vitamin D3 50 MCG (2000 UT) Caps Generic drug: Cholecalciferol Take 1 capsule (2,000 Units total) by mouth daily.          Follow-up Information     Tysinger, Camelia Eng, PA-C. Schedule an appointment as soon as possible for a visit in 1 week(s).   Specialty: Family Medicine Why: post hospitalization follow up Contact information: [address] [place] [address] [phone]  TOTAL DISCHARGE TIME: 35 minutes  Carvell Hoeffner [employer] on  [email or web address]  06/30/2022, 1:07 PM

## 2022-06-29 NOTE — Progress Notes (Signed)
AVS given to patient and explained at the bedside. Medications and follow up appointments have been explained with pt verbalizing understanding.  

## 2022-06-30 ENCOUNTER — Telehealth: Payer: Self-pay

## 2022-06-30 NOTE — Transitions of Care (Post Inpatient/ED Visit) (Signed)
   06/30/2022  Name: Brandi Stephens MRN: [id number] DOB: 1946/02/12  Today's TOC FU Call Status: Today's TOC FU Call Status:: Unsuccessul Call (1st Attempt) Unsuccessful Call (1st Attempt) Date: 06/30/22  Attempted to reach the patient regarding the most recent Inpatient/ED visit.  Follow Up Plan: Additional outreach attempts will be made to reach the patient to complete the Transitions of Care (Post Inpatient/ED visit) call.   [place] LPN [place] Advisor Direct Dial [phone]

## 2022-06-30 NOTE — Transitions of Care (Post Inpatient/ED Visit) (Signed)
   06/30/2022  Name: Brandi Stephens MRN: [id number] DOB: 01/18/1946  Today's TOC FU Call Status: Today's TOC FU Call Status:: Successful TOC FU Call Competed TOC FU Call Complete Date: 06/30/22  Transition Care Management Follow-up Telephone Call Date of Discharge: 06/29/22 Discharge Facility: [facility]) Type of Discharge: Inpatient Admission Primary Inpatient Discharge Diagnosis:: "lactic acidosis, dehydration,shock" How have you been since you were released from the hospital?: Better (Patient pleased to report she is feeling better. She still has some occassional dizziness at times but sxs improved.) Any questions or concerns?: No  Items Reviewed: Did you receive and understand the discharge instructions provided?: No Medications obtained and verified?: Yes (Medications Reviewed) Any new allergies since your discharge?: No Dietary orders reviewed?: Yes Type of Diet Ordered:: low salt/heart healthy Do you have support at home?: Yes People in Home: spouse, child(ren), adult Name of Support/Comfort Primary Source: Herbie [place], dtr-[name]  Home Care and Equipment/Supplies: [place] Ordered?: Yes Name of [place]:: [place] set up a time to come to your home?: No EMR reviewed for [place] Orders:  (Patient states she has already called and left message to arrange HV and is waiting a return call.) Any new equipment or medical supplies ordered?: No  Functional Questionnaire: Do you need assistance with bathing/showering or dressing?: No Do you need assistance with meal preparation?: No Do you need assistance with eating?: No Do you have difficulty maintaining continence: No Do you need assistance with getting out of bed/getting out of a chair/moving?: No Do you have difficulty managing or taking your medications?: No  Folllow up appointments reviewed: PCP Follow-up appointment confirmed?: Yes Date of PCP follow-up appointment?:  07/04/22 Follow-up Provider: [facility] Follow-up appointment confirmed?: Yes Date of Specialist follow-up appointment?: 07/05/22 Follow-Up Specialty Provider:: Dr. Ranell Patrick, Dr.Campbell-07/14/22 Do you need transportation to your follow-up appointment?: No Do you understand care options if your condition(s) worsen?: Yes-patient verbalized understanding  SDOH Interventions Today    Flowsheet Row Most Recent Value  SDOH Interventions   Food Insecurity Interventions Intervention Not Indicated  Transportation Interventions Intervention Not Indicated      TOC Interventions Today    Flowsheet Row Most Recent Value  TOC Interventions   TOC Interventions Discussed/Reviewed TOC Interventions Discussed      Interventions Today    Flowsheet Row Most Recent Value  Education Interventions   Education Provided Provided Education  Provided Verbal Education On Nutrition, When to see the doctor, Other, Medication  Nutrition Interventions   Nutrition Discussed/Reviewed Nutrition Discussed, Decreasing salt  Pharmacy Interventions   Pharmacy Dicussed/Reviewed Pharmacy Topics Discussed, Medications and their functions  Safety Interventions   Safety Discussed/Reviewed Safety Discussed, Fall Risk, Home Safety  [reviewed ways to manage dizzy spells and changing positions]       Enzo Montgomery, RN,BSN,CCM [facility] Care Management Care Management Community Coordinator Direct Phone: [phone] Toll Free: [phone] Fax: [phone]

## 2022-07-03 DIAGNOSIS — Z96651 Presence of right artificial knee joint: Secondary | ICD-10-CM | POA: Diagnosis not present

## 2022-07-03 DIAGNOSIS — E1122 Type 2 diabetes mellitus with diabetic chronic kidney disease: Secondary | ICD-10-CM | POA: Diagnosis not present

## 2022-07-03 DIAGNOSIS — N189 Chronic kidney disease, unspecified: Secondary | ICD-10-CM | POA: Diagnosis not present

## 2022-07-03 DIAGNOSIS — M48061 Spinal stenosis, lumbar region without neurogenic claudication: Secondary | ICD-10-CM | POA: Diagnosis not present

## 2022-07-03 DIAGNOSIS — I5032 Chronic diastolic (congestive) heart failure: Secondary | ICD-10-CM | POA: Diagnosis not present

## 2022-07-03 DIAGNOSIS — E785 Hyperlipidemia, unspecified: Secondary | ICD-10-CM | POA: Diagnosis not present

## 2022-07-03 DIAGNOSIS — Z9981 Dependence on supplemental oxygen: Secondary | ICD-10-CM | POA: Diagnosis not present

## 2022-07-03 DIAGNOSIS — E079 Disorder of thyroid, unspecified: Secondary | ICD-10-CM | POA: Diagnosis not present

## 2022-07-03 DIAGNOSIS — I951 Orthostatic hypotension: Secondary | ICD-10-CM | POA: Diagnosis not present

## 2022-07-03 DIAGNOSIS — Z6828 Body mass index (BMI) 28.0-28.9, adult: Secondary | ICD-10-CM | POA: Diagnosis not present

## 2022-07-03 DIAGNOSIS — K5900 Constipation, unspecified: Secondary | ICD-10-CM | POA: Diagnosis not present

## 2022-07-03 DIAGNOSIS — J449 Chronic obstructive pulmonary disease, unspecified: Secondary | ICD-10-CM | POA: Diagnosis not present

## 2022-07-03 DIAGNOSIS — Z8616 Personal history of COVID-19: Secondary | ICD-10-CM | POA: Diagnosis not present

## 2022-07-03 DIAGNOSIS — E669 Obesity, unspecified: Secondary | ICD-10-CM | POA: Diagnosis not present

## 2022-07-03 DIAGNOSIS — N179 Acute kidney failure, unspecified: Secondary | ICD-10-CM | POA: Diagnosis not present

## 2022-07-03 DIAGNOSIS — G2581 Restless legs syndrome: Secondary | ICD-10-CM | POA: Diagnosis not present

## 2022-07-03 DIAGNOSIS — D631 Anemia in chronic kidney disease: Secondary | ICD-10-CM | POA: Diagnosis not present

## 2022-07-03 DIAGNOSIS — F419 Anxiety disorder, unspecified: Secondary | ICD-10-CM | POA: Diagnosis not present

## 2022-07-03 DIAGNOSIS — F32A Depression, unspecified: Secondary | ICD-10-CM | POA: Diagnosis not present

## 2022-07-03 DIAGNOSIS — M1030 Gout due to renal impairment, unspecified site: Secondary | ICD-10-CM | POA: Diagnosis not present

## 2022-07-03 DIAGNOSIS — I130 Hypertensive heart and chronic kidney disease with heart failure and stage 1 through stage 4 chronic kidney disease, or unspecified chronic kidney disease: Secondary | ICD-10-CM | POA: Diagnosis not present

## 2022-07-03 DIAGNOSIS — K219 Gastro-esophageal reflux disease without esophagitis: Secondary | ICD-10-CM | POA: Diagnosis not present

## 2022-07-03 DIAGNOSIS — E1140 Type 2 diabetes mellitus with diabetic neuropathy, unspecified: Secondary | ICD-10-CM | POA: Diagnosis not present

## 2022-07-03 DIAGNOSIS — E872 Acidosis, unspecified: Secondary | ICD-10-CM | POA: Diagnosis not present

## 2022-07-04 ENCOUNTER — Telehealth: Payer: Self-pay | Admitting: Medical

## 2022-07-04 ENCOUNTER — Other Ambulatory Visit (HOSPITAL_COMMUNITY): Payer: Medicare HMO

## 2022-07-04 ENCOUNTER — Ambulatory Visit (INDEPENDENT_AMBULATORY_CARE_PROVIDER_SITE_OTHER): Payer: Medicare HMO | Admitting: Medical

## 2022-07-04 ENCOUNTER — Telehealth: Payer: Self-pay | Admitting: Internal Medicine

## 2022-07-04 VITALS — BP 110/64 | HR 64 | Temp 97.6°F | Wt 189.9 lb

## 2022-07-04 DIAGNOSIS — Z79899 Other long term (current) drug therapy: Secondary | ICD-10-CM | POA: Diagnosis not present

## 2022-07-04 DIAGNOSIS — I5032 Chronic diastolic (congestive) heart failure: Secondary | ICD-10-CM | POA: Diagnosis not present

## 2022-07-04 DIAGNOSIS — E118 Type 2 diabetes mellitus with unspecified complications: Secondary | ICD-10-CM | POA: Diagnosis not present

## 2022-07-04 DIAGNOSIS — M4626 Osteomyelitis of vertebra, lumbar region: Secondary | ICD-10-CM | POA: Diagnosis not present

## 2022-07-04 DIAGNOSIS — I951 Orthostatic hypotension: Secondary | ICD-10-CM

## 2022-07-04 DIAGNOSIS — Z8619 Personal history of other infectious and parasitic diseases: Secondary | ICD-10-CM | POA: Diagnosis not present

## 2022-07-04 DIAGNOSIS — E038 Other specified hypothyroidism: Secondary | ICD-10-CM

## 2022-07-04 DIAGNOSIS — N179 Acute kidney failure, unspecified: Secondary | ICD-10-CM

## 2022-07-04 DIAGNOSIS — K5909 Other constipation: Secondary | ICD-10-CM | POA: Diagnosis not present

## 2022-07-04 DIAGNOSIS — I10 Essential (primary) hypertension: Secondary | ICD-10-CM | POA: Diagnosis not present

## 2022-07-04 MED ORDER — CEFADROXIL 500 MG PO CAPS: 500.0000 mg | ORAL_CAPSULE | Freq: Two times a day (BID) | ORAL | 0 refills | Status: DC

## 2022-07-04 MED ORDER — AMITRIPTYLINE HCL 100 MG PO TABS: 100.0000 mg | ORAL_TABLET | Freq: Every day | ORAL | 0 refills | Status: DC

## 2022-07-04 NOTE — Progress Notes (Unsigned)
Subjective:    Patient ID: Brandi Stephens, female    DOB: [birth year], 77 y.o.   MRN: [id number]  An audio/video tele-health visit is felt to be the most appropriate encounter for this patient at this time. This is a follow up tele-visit via phone. The patient is at home. MD is at office. Prior to scheduling this appointment, our staff discussed the limitations of evaluation and management by telemedicine and the availability of in-person appointments. The patient expressed understanding and agreed to proceed.   Brandi Stephens presents for f/u after CIR admission for staph bacteremia after infection of her spinal hardware, and for neuropathy. Everything is the same since last visit. She is having more trouble with neuropathy in her feet and hands. The Cymbalta 60mg  does help somewhat. She used to get swelling with Gabapentin. The pain is bad during the day and night. Pain is improved to 4. She cannot feel her two little toes. Discussed Lyrica as an option. She would like try the Sprint PNS. Her back pain is the most severe. She has hardware. She follows with Dr. Bridget Hartshorn at the end of the month. The pain in her back feels like a dagger. The pain is more directly on the spine.   Has recently been falling again Neuropathy has her feet so severe that she cannot feel her lower extremities, she notes the neuropathy is worsening, but she had excellent relief from [place] for 2 weeks after first application. After the most recent application she has had severe burning pain around her ankles and along the 5th digit. It has kept her up at night and she was not able to enjoy her 76th birthday party. The Valium has not been enough to control this pain. She asks for something stronger. The Cymbalta helps her upper extremity neuropathy but not her lower extremity neuropathy. Her Valium needs to be refilled. She asks if the dose can be increased. She does not take every day. The Valium helps with her pain as well.  She  asks if we can go higher on the Topamax.   The Amitriptyline helps her to sleep a little better. She has improved sleep from 1 to 4 hours. She is having severe neuropathy in her left foot. The Cymbalta is helping with the neuropathic pain in her hands. She has been tolerating this medication well.   Last visit I transitioned her to outaptient therapy and she has an appointment today. She has noted improvements in her strength. She is ambulating steadily with RW today. She has bene trying using a cane with therapy. Outpatient therapy is doing well. She has been taking 1/2 a tablet of hydrocodone to help her tolerate better (prescribed by her surgeon).   Pain is currently 5/10 and worst in her lower back where her spinal harware is present as well as in paraspinals. No longer acquiring She has been taking approximately 1 percocet per day and would like to gradually stop using these. She has also been using four 650mg  of Tylenol per day and two 200mg  ibuprofen daily.   She no longer has severe diarrhea or constipation and is much happier about this. It does come and go. She takes 1000mg  magensium at night. She takes colace BID. She likes to eat healthy foods.   Her daughter accompanies her this visit and helps to provide history. She and her daughter have been out of work for 5 years due to her health conditions and need for her daughter's assistance. Her grandchild  recently graduated kindergarten.   1) B/l lower extremity edema She continues to have mild bilateral lower extremity edema.  Discussed use of compression garments, elevation, and ice.  Discussed the benefits of lymphedema therapy and she is willing to try this.  -She has been having pooling of fluids throughout her body -She has never seen nephrology but has followed with cardiology -Lasix administration is limited due to her creatinine.  -improved after Qutenza administration on Friday  2) Bilateral knees: -she has fallen on her knees  so many times.  -she has been using blue emu oil. -she has been using voltaren gel.  -she has a history of arthroscopic surgery and steroid injections -she uses turmeric  3) Muscle spasms -the Valium does help  4) Peripheral neuropathy: -60mg  Cymbalta is helping. -improved after administration of Qutenza on Friday -she did have some burning on ankle and shin after Qutenza and this has improved after she used the cooling gel -she loves the pain journal and keeps it by her -asks about the topamax -it is really affecting her quality of life  5) Prediabetes -has been eating very healthy foods  6) Anxiety: -they Valium helps- she uses as needed.  -she appreciates her husband's and daughter's care.   5) Dizziness -feels related to going out, anxiety.  6) obesity -has been trying to lose weight     Pain Inventory Average Pain 4 Pain Right Now 2 My pain is constant, sharp, burning, stabbing and aching  In the last 24 hours, has pain interfered with the following? General activity 4 Relation with others 0 Enjoyment of life 5 What TIME of day is your pain at its worst?  varies Sleep (in general) Poor  Pain is worse with: walking, bending, sitting and standing Pain improves with: rest, heat/ice, therapy/exercise and medication Relief from Meds: 2        Family History  Problem Relation Age of Onset   Atrial fibrillation Mother    Ulcers Father    Breast cancer Maternal Aunt    Lung cancer Maternal Uncle    Stomach cancer Maternal Grandmother    Lung cancer Maternal Aunt    Brain cancer Maternal Aunt    Prostate cancer Maternal Grandfather    Stroke Paternal Grandmother    Tuberculosis Paternal Grandfather    Colon cancer Neg Hx    Esophageal cancer Neg Hx    Rectal cancer Neg Hx    Social History   Socioeconomic History   Marital status: Married    Spouse name: robert   Number of children: 3   Years of education: college   Highest education level:  Not on file  Occupational History   Occupation: retired  Tobacco Use   Smoking status: Former    Packs/day: 0.50    Years: 35.00    Additional pack years: 0.00    Total pack years: 17.50    Types: Cigarettes    Quit date: 05/22/2018    Years since quitting: 4.1   Smokeless tobacco: Never  Vaping Use   Vaping Use: Never used  Substance and Sexual Activity   Alcohol use: Yes    Comment: 1 glass of wine a night   Drug use: Not Currently    Comment: CBD tincture   Sexual activity: Yes    Birth control/protection: Post-menopausal  Other Topics Concern   Not on file  Social History Narrative   Lives with husband in a 2 story home.  Has 2 living children.  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  Education: college.    Right-handed.   Five cups coffee per week.   Social Determinants of Health   Financial Resource Strain: Low Risk  (03/18/2022)   Overall Financial Resource Strain (CARDIA)    Difficulty of Paying Living Expenses: Not hard at all  Food Insecurity: No Food Insecurity (06/30/2022)   Hunger Vital Sign    Worried About Running Out of Food in the Last Year: Never true    Ran Out of Food in the Last Year: Never true  Transportation Needs: No Transportation Needs (06/30/2022)   PRAPARE - Hydrologist (Medical): No    Lack of Transportation (Non-Medical): No  Physical Activity: Inactive (03/18/2022)   Exercise Vital Sign    Days of Exercise per Week: 0 days    Minutes of Exercise per Session: 0 min  Stress: No Stress Concern Present (03/18/2022)   [place]    Feeling of Stress : Only a little  Social Connections: Not on file   Past Surgical History:  Procedure Laterality Date   ABDOMINAL HYSTERECTOMY  1985   BASAL CELL CARCINOMA EXCISION  10/15   nose   BREAST BIOPSY Right 08/24/2015   BREAST BIOPSY Right 07/13/2015   BREAST BIOPSY Left 07/13/2015   BREAST  EXCISIONAL BIOPSY Left 1972   BREAST LUMPECTOMY Left    BREAST LUMPECTOMY WITH RADIOACTIVE SEED AND SENTINEL LYMPH NODE BIOPSY Left 09/16/2015   Procedure: BREAST LUMPECTOMY WITH RADIOACTIVE SEED AND SENTINEL LYMPH NODE BIOPSY;  Surgeon: Stark Klein, MD;  Location: [facility];  Service: General;  Laterality: Left;   BREAST REDUCTION SURGERY Bilateral 1998   CATARACT EXTRACTION W/ INTRAOCULAR LENS IMPLANT Left 2016   CERVICAL SPINE SURGERY  1995   hallo   COLONOSCOPY  01/2013   polyps   EYE SURGERY Left 2016   HAND SURGERY Left 02-19-2002   dr Fredna Dow  @[facility]    repair collateral ligament/ MPJ left thumb   HARDWARE REMOVAL N/A 07/24/2019   Procedure: REVISION OF HARDWARE Lumbar two - Sacral one. Extention of Fusion to Lumbar one;  Surgeon: [name], MD;  Location: [facility];  Service: Neurosurgery;  Laterality: N/A;   HEMORRHOID SURGERY  2008   HIGH RESOLUTION ANOSCOPY N/A 02/28/2018   Procedure: HIGH RESOLUTION ANOSCOPY WITH BIOPSY;  Surgeon: Leighton Ruff, MD;  Location: [facility];  Service: General;  Laterality: N/A;   KNEE ARTHROSCOPY Right 2002   LUMBAR SPINE SURGERY  03-20-2015   dr Kathyrn Sheriff   fusion L4-5, L5-S1   MOUTH SURGERY  07/14/2018   2 infected teeth removed   MULTIPLE TOOTH EXTRACTIONS     with bone grafting lower bottom right and left   REDUCTION MAMMAPLASTY Bilateral    RIGHT COLECTOMY  09-10-2003   dr Margot Chimes @[facility]    multiple colon polyps (per path tubular adenoma's, hyperplastic , benign appendix, benign two lymph nodes   ROTATOR CUFF REPAIR Left 04/2016   TONSILLECTOMY  1969   TUBAL LIGATION Bilateral 1982   Past Medical History:  Diagnosis Date   AIN (anal intraepithelial neoplasia) anal canal    Anemia    with pregnancy   Anxiety    Arthritis    knees, lower back, knees   Chronic constipation    Chronic diastolic CHF (congestive heart failure) ([place]) 07/05/2019   CKD (chronic kidney disease)    Coarse tremors    Essential  COPD (chronic obstructive pulmonary disease) ([place])    2017 chest CT, history of   Degenerative lumbar spinal stenosis    Depression    Depression 07/05/2019   Drug dependency ([place])    Elevated PTHrP level 05/09/2019   EtOH use 07/05/2019   Pt denies heavy drinking however   GERD (gastroesophageal reflux disease)    pt denies   Gout    Grade I diastolic dysfunction 0000000   Noted ECHO   Hemorrhoids    History of adenomatous polyp of colon    History of basal cell carcinoma (BCC) excision 2015   nose   History of sepsis 05/14/2014   post lumbar surgery   History of shingles    x2   Hyperlipidemia    Hypertension    Hypothyroidism    Irregular heart beat    history of while undergoing radiation   Malignant neoplasm of upper-inner quadrant of left breast in female, estrogen receptor positive [facility]) oncologist-  dr Jana Hakim--  per lov in epic no recurrence   dx 07-13-2015  Left breast invasive ductal carcinoma, Stage IA, Grade 1 (TXN0),  09-16-2015  s/p  left breast lumpectomy with sln bx's,  completed radiation 11-18-2015,  started antiestrogen therapy 12-14-2015   Neuropathy    Left foot and back of left leg   Obesity (BMI 30-39.9) 07/05/2019   Personal history of radiation therapy    completed 11-18-2015  left breast   Pneumonia    PONV (postoperative nausea and vomiting)    Prediabetes    diet controlled, no med   Restless leg syndrome    Seasonal allergies    Seizure ([place]) 05/2015   due to sepsis, only one time episode   Tremor    Wears partial dentures    lower    There were no vitals taken for this visit.  Opioid Risk Score:   Fall Risk Score:  `1  Depression screen PHQ 2/9     05/17/2022    2:28 PM 03/18/2022   11:40 AM 11/19/2021    1:40 PM 08/25/2021   10:32 AM 08/24/2021    9:36 AM 08/10/2021    2:37 PM 07/02/2021   10:46 AM  Depression screen PHQ 2/9  Decreased Interest 0 0 0 0 0 0 0  Down, Depressed, Hopeless 0 0 0 0 0 0 0  PHQ - 2 Score 0 0 0 0 0  0 0  Altered sleeping  1 0    1  Tired, decreased energy  1 1    1   Change in appetite  0 0    0  Feeling bad or failure about yourself   0 0    0  Trouble concentrating  0 0    0  Moving slowly or fidgety/restless  0 0    0  Suicidal thoughts  0 0    0  PHQ-9 Score  2 1    2   Difficult doing work/chores  Not difficult at all Not difficult at all    Not difficult at all    Review of Systems  Constitutional: Negative.   HENT: Negative.    Eyes: Negative.   Respiratory: Negative.    Cardiovascular: Negative.   Gastrointestinal: Negative.   Endocrine: Negative.   Genitourinary: Negative.   Musculoskeletal:  Positive for gait problem.  Skin: Negative.   Allergic/Immunologic: Negative.   Neurological:  Positive for dizziness, tremors, weakness and numbness.       Tingling   Hematological: Negative.  Psychiatric/Behavioral: Negative.    All other systems reviewed and are negative.      Objective:   Physical Exam Not performed  Assessment & Plan:  Brandi Stephens is a 77 year old female who presents for f/u of neuropathy and below conditions.   1) Diarrhea: This was a huge issue previously and has since improved. I had obtained an XR that showed constipation and recommended an enema. Continue magnesium 1000mg  at night and colace BID. This continues but is improved.   2) Impaired mobility and ADLs following spinal surgery: -Continue HEP Progressing well. Increase walking to 10 minutes per day -Recommended exercise bike at home to build back endurance. Continue walking outside as well as strength and gait are improving and she is no longer limited by diarrhea. -She has been having a lot of falls at home  3) Post-operative lower back pain: -Stopped oxycodone- was prescribed 1/2 pill hydrocodone by her her spine surgery.  -Continue to supplement with icing, Tyelnol, and Ibuprofen.  -recommended topamax recommended to clear with cardiology first  4) Peripheral  neuropathy: -Discussed Sprint PNS system as an option of pain treatment via neuromodulation. Provided following link for patient to learn more about the system: https://www.sprtherapeutics.com/. She would like to try this. Will target sciatic nerve at the popliteal fossa.  -Refilled Amitriptyline to 75mg  HS PRN.  -Discussed Qutenza as an option for neuropathic pain control. Discussed that this is a capsaicin patch, stronger than capsaicin cream. Discussed that it is currently approved for diabetic peripheral neuropathy and post-herpetic neuralgia, but that it has also shown benefit in treating other forms of neuropathy. Provided patient with link to site to learn more about the patch: Cinema[employer].fr. Discussed that the patch would be placed in office and benefits usually last 3 months. Discussed that unintended exposure to capsaicin can cause severe irritation of eyes, mucous membranes, respiratory tract, and skin, but that Qutenza is a local treatment and does not have the systemic side effects of other nerve medications. Discussed that there may be pain, itching, erythema, and decreased sensory function associated with the application of Qutenza. Side effects usually subside within 1 week. A cold pack of analgesic medications can help with these side effects. Blood pressure can also be increased due to pain associated with administration of the patch.  6 patches of Qutenza was applied to the area of pain. Ice packs were applied during the procedure to ensure patient comfort. Blood pressure was monitored every 15 minutes. The patient tolerated the procedure well. Post-procedure instructions were given and follow-up has been scheduled. -next time will only use 4 patches given increase in pain with 6 patches -will prescribe 5-7 day course of prn Norco to help with her current severe pain from the [place] application -will increase her Amitriptyline to 100mg .  -discussed that symptoms should abate  soon, and to please keep me updated.     Great benefit- schedule for the rest of the year every 3 months Continue keeping pain journal Increase topamax to 50mg  HS  5) Anixety: contiue Valium to 10mg  daily prn. Provided refill. Has been taking twice per day and this has been helping her. Also helps with her muscle spasms. This has been well controlled. She is sometimes upset that she is only comfortably horizontally.   6) Tachycardia: provided higher dose of metoprolol which she was on before last visit. HR is still slightly elevated today, but BP is low. Maintain current dose. She says BP was elevated at her doctor's appointment yesterday.  It fluctuates.   7) Lower extremity edema: advised elevation, ice, and compression garments which she is already doing.  -continue Lasix -start lymphedema therapy -continue Qutenza q53months  8) Hot flashes: from Anastrazole.   Home support: Living with husband. Daughter lives 5 miles away.   9) Bilateral knee pain: -continue voltaren gel and blue emu oil -recommended bacopa  10) Obesity -Educated that current weight is 260 lbs and current BMI is 39.53. She has lost 3 lbs since last visit! -Educated regarding health benefits of weight loss- for pain, general health, chronic disease prevention, immune health, mental health.  -Will monitor weight every visit.  -Consider [place] tea daily.  -Discussed the benefits of intermittent fasting. -Discussed foods that can assist in weight loss: 1) leafy greens- high in fiber and nutrients 2) dark chocolate- improves metabolism (if prefer sweetened, best to sweeten with honey instead of sugar).  3) cruciferous vegetables- high in fiber and protein 4) full fat yogurt: high in healthy fat, protein, calcium, and probiotics 5) apples- high in a variety of phytochemicals 6) nuts- high in fiber and protein that increase feelings of fullness 7) grapefruit: rich in nutrients, antioxidants, and fiber (not to be taken  with anticoagulation) 8) beans- high in protein and fiber 9) salmon- has high quality protein and healthy fats 10) green tea- rich in polyphenols 11) eggs- rich in choline and vitamin D 12) tuna- high protein, boosts metabolism 13) avocado- decreases visceral abdominal fat 14) chicken (pasture raised): high in protein and iron 15) blueberries- reduce abdominal fat and cholesterol 16) whole grains- decreases calories retained during digestion, speeds metabolism 17) chia seeds- curb appetite 18) chilies- increases fat metabolism  -Discussed supplements that can be used:  1) Metatrim 400mg  BID 30 minutes before breakfast and dinner  2) Sphaeranthus indicus and Garcinia mangostana (combinations of these and #1 can be found in capsicum and zychrome  3) green coffee bean extract 400mg  twice per day or Irvingia (african mango) 150 to 300mg  twice per day.  11) Onchomycosis: -prescribed Miconazole BID  6 minutes spent on discussing her severe pain after Qutenza application, present in her ankles and 5th digit.

## 2022-07-04 NOTE — Progress Notes (Signed)
Subjective:  Brandi Stephens is a 77 y.o. female who presents for Chief Complaint  Patient presents with   Hospitalization Follow-up    Hospital follow-up- norovirus and sepsis     Here for hospitalization follow-up.  She is alone today.  Patient Care Team: Koji Niehoff, Camelia Eng, PA-C as PCP - General (Family Medicine) Debara Pickett Nadean Corwin, MD as PCP - Cardiology (Cardiology) Magrinat, Virgie Dad, MD as Consulting Physician (Oncology) Stark Klein, MD as Consulting Physician (General Surgery) [name], MD as Consulting Physician (Family Medicine) [name], MD as Consulting Physician (Neurosurgery) Griselda Miner, MD as Consulting Physician (Dermatology) Delice Bison, Charlestine Massed, NP as Nurse Practitioner (Hematology and Oncology) Marchia Bond, MD as Consulting Physician (Orthopedic Surgery) Clydell Hakim, MD (Inactive) as Consulting Physician (Anesthesiology) Armbruster, Carlota Raspberry, MD as Consulting Physician (Gastroenterology) [name], MD as Consulting Physician (Neurosurgery) Marcial Pacas, MD as Consulting Physician (Neurology) Viona Gilmore, [facility] as Pharmacist (Pharmacist)   Brandi Stephens is a 77 yo female with past medical history of diastolic CHF, COPD on O2, CKD-3A, HTN, HLD, diabetes, chronic osteomyelitis of the lumbar spine, MSSA, gout, history of breast cancer, chronic constipation, insomnia, autoimmune disease, chronic venous insufficiency, polypharmacy, 2023 2024 with over 100 pound weight loss on Ozempic.  History of recent sepsis.  Admit date: 06/20/2022 Discharge date: 06/29/2022     DISCHARGE DIAGNOSES:    Severe sepsis with acute organ dysfunction (HCC)   AKI (acute kidney injury) ([place])   Essential hypertension   Type II diabetes mellitus with complication ([place])   Concerns today: Feels much better than her recent hospitalization.     She notes daily BM, no current c/o constipation.  Has some looser stool but no diarrhea, and improving.  Still  using TED hose.  Hospitalization history: She reported to the emergency department with 2 weeks of nausea vomiting and diarrhea,  dizziness, falling, abdominal discomfort, was hypotensive and had abnormal labs.  She was admitted for sepsis and GI pathogen was positive for norovirus.  Given orthostatic hypotension in general along with dehydration, she was given IV fluids and was started on midodrine and titrated to 10 mg 3 times a day to combat orthostatic hypotension.  Metoprolol and Bumex was placed on hold at discharge and during hospitalization due to orthostatic hypotension  She was also placed on TED hose.  Adrenal insufficiency test was done to rule this out.  She had consults with physical therapy and cardiology.  Echocardiogram did not show significant abnormalities and carotid Dopplers did not show significant stenosis.  CT abdomen pelvis shows a lot of stool and esophagitis but no other abnormal findings.  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  Sees Dr. Megan Salon 07/14/22.  Acute kidney injury-baseline creatinine 0.9 but presented with creatinine of 1.3 and hypokalemia, treated with IV fluids.  Hypokalemia and hypomagnesia replaced during hospitalization  Neurosurgery Dr. Merdis Delay was consulted during this hospitalization, and stated that the CT findings were chronic with the osteomyelitis of the L-spine and did not see any change in the infection.  No other aggravating or relieving factors.    No other c/o.  Past Medical History:  Diagnosis Date   AIN (anal intraepithelial neoplasia) anal canal    Anemia    with pregnancy   Anxiety    Arthritis    knees, lower back, knees   Chronic constipation    Chronic diastolic CHF (congestive heart failure) ([place])  07/05/2019   CKD (chronic kidney disease)    Coarse tremors    Essential   COPD (chronic obstructive pulmonary disease) ([place])     2017 chest CT, history of   Degenerative lumbar spinal stenosis    Depression    Depression 07/05/2019   Drug dependency ([place])    Elevated PTHrP level 05/09/2019   EtOH use 07/05/2019   Pt denies heavy drinking however   GERD (gastroesophageal reflux disease)    pt denies   Gout    Grade I diastolic dysfunction 0000000   Noted ECHO   Hemorrhoids    History of adenomatous polyp of colon    History of basal cell carcinoma (BCC) excision 2015   nose   History of sepsis 05/14/2014   post lumbar surgery   History of shingles    x2   Hyperlipidemia    Hypertension    Hypothyroidism    Irregular heart beat    history of while undergoing radiation   Malignant neoplasm of upper-inner quadrant of left breast in female, estrogen receptor positive [facility]) oncologist-  dr Jana Hakim--  per lov in epic no recurrence   dx 07-13-2015  Left breast invasive ductal carcinoma, Stage IA, Grade 1 (TXN0),  09-16-2015  s/p  left breast lumpectomy with sln bx's,  completed radiation 11-18-2015,  started antiestrogen therapy 12-14-2015   Neuropathy    Left foot and back of left leg   Obesity (BMI 30-39.9) 07/05/2019   Personal history of radiation therapy    completed 11-18-2015  left breast   Pneumonia    PONV (postoperative nausea and vomiting)    Prediabetes    diet controlled, no med   Restless leg syndrome    Seasonal allergies    Seizure ([place]) 05/2015   due to sepsis, only one time episode   Tremor    Wears partial dentures    lower    Current Outpatient Medications on File Prior to Visit  Medication Sig Dispense Refill   allopurinol (ZYLOPRIM) 300 MG tablet Take 300 mg by mouth daily.     atorvastatin (LIPITOR) 80 MG tablet Take 1 tablet (80 mg total) by mouth daily. 90 tablet 0   Biotin 3 MG TABS Take 3 mg by mouth daily.     Cholecalciferol (VITAMIN D3) 50 MCG (2000 UT) capsule Take 1 capsule (2,000 Units total) by mouth daily. 90 capsule 3   diazepam (VALIUM) 10 MG tablet TAKE 1  TABLET(10 MG) BY MOUTH EVERY 6 HOURS AS NEEDED FOR ANXIETY (Patient taking differently: Take 10 mg by mouth every 6 (six) hours as needed for anxiety.) 30 tablet 3   DULoxetine (CYMBALTA) 60 MG capsule TAKE 1 CAPSULE(60 MG) BY MOUTH DAILY 90 capsule 0   Fluticasone-Umeclidin-Vilant ([name] ELLIPTA) 100-62.5-25 MCG/ACT AEPB Inhale 1 puff into the lungs daily. 90 each 3   levothyroxine (SYNTHROID) 100 MCG tablet Take 1 tablet (100 mcg total) by mouth daily. 30 tablet 2   Magnesium Gluconate 250 MG TABS Take 1 tablet by mouth daily.     midodrine (PROAMATINE) 10 MG tablet Take 1 tablet (10 mg total) by mouth 3 (three) times daily with meals. 90 tablet 1   pantoprazole (PROTONIX) 40 MG tablet Take 1 tablet (40 mg total) by mouth daily for 14 days. 14 tablet 0   polyethylene glycol powder (GLYCOLAX/MIRALAX) 17 GM/SCOOP powder Take 17 g by mouth daily as needed for moderate constipation.     primidone (MYSOLINE) 50 MG tablet TAKE 1 TABLET BY MOUTH  TWICE DAILY 180 tablet 3   Probiotic Product (PROBIOTIC DAILY PO) Take 1 tablet by mouth daily.     Propylene Glycol (SYSTANE BALANCE OP) Place 1 drop into both eyes 4 (four) times daily as needed (dry eyes).     rOPINIRole (REQUIP) 1 MG tablet TAKE 1 TABLET(1 MG) BY MOUTH AT BEDTIME 90 tablet 0   topiramate (TOPAMAX) 50 MG tablet Take 1 tablet (50 mg total) by mouth at bedtime. 90 tablet 3   triamcinolone ointment (KENALOG) 0.1 % Apply 1 Application topically 2 (two) times daily.     blood glucose meter kit and supplies KIT Dispense based on patient and insurance preference. Use up to four times daily as directed. 1 each 0   colchicine 0.6 MG tablet Take 2 tablets at once then take 1 tablet an hour later then then 1 tablet twice daily x 3 days. (Patient not taking: Reported on 07/04/2022) 30 tablet 2   Continuous Blood Gluc Sensor (FREESTYLE LIBRE 3 SENSOR) MISC Place 1 sensor on the skin every 14 days. Use to check glucose continuously 2 each 6    HYDROcodone-acetaminophen (NORCO/VICODIN) 5-325 MG tablet Take 1 tablet by mouth every 4 (four) hours as needed for moderate pain. (Patient not taking: Reported on 07/04/2022) 20 tablet 0   OXYGEN Inhale 2 L into the lungs.     Semaglutide, 1 MG/DOSE, 4 MG/3ML SOPN Inject 1 mg as directed once a week. (Patient not taking: Reported on 07/04/2022) 3 mL 2   No current facility-administered medications on file prior to visit.    The following portions of the patient's history were reviewed and updated as appropriate: allergies, current medications, past family history, past medical history, past social history, past surgical history and problem list.  ROS Otherwise as in subjective above    Objective: BP 110/64   Pulse 64   Temp 97.6 F (36.4 C)   Wt 189 lb 14.4 oz (86.1 kg)   BMI 29.30 kg/m   Wt Readings from Last 3 Encounters:  07/04/22 189 lb 14.4 oz (86.1 kg)  06/20/22 191 lb (86.6 kg)  06/07/22 184 lb (83.5 kg)   General appearance: alert, no distress, well developed, well nourished Oral cavity: MMM Neck: supple, no lymphadenopathy, no thyromegaly, no masses, no JVD Heart: RRR, normal S1, S2, no murmurs Lungs: CTA bilaterally, no wheezes, rhonchi, or rales Abdomen: +bs, soft, non tender, non distended, no masses, no hepatomegaly, no splenomegaly Pulses: 2+ radial pulses, 2+ pedal pulses, normal cap refill Ext: no edema    Assessment: Encounter Diagnoses  Name Primary?   Orthostatic hypotension Yes   History of sepsis    Chronic diastolic CHF (congestive heart failure) (HCC)    Essential hypertension    Chronic constipation    Type II diabetes mellitus with complication (HCC)    Osteomyelitis of lumbar spine (HCC)    AKI (acute kidney injury) ([place])    High risk medication use    Other specified hypothyroidism      Plan: We discussed her hospitalization, findings, treatment. Medications reconciled.  I reviewed her discharge summary, imaging, labs,  recommendations   Orthostatic hypotension Metoprolol and Bumex discontinued with hospitalization.  Continue to hold OFF on these 2 medications, Metoprolol and Bumex. She currently is continuing Midodrine 10mg  TID. You will need to monitor weights and blood pressures The goal is to keep weight stable where it is, and not gaining fluid weight.   So monitor for any increase of 3-5lb or more in a  given.  If you see a weight gain such as this, then call/recheck.  We want to keep BP 110/65 or higher.  You are stable today, but if you see numbers less than this, let us or cardiology know right away.  Of if BPs are rising above 130/80, let us know as well  Diabetes  in light of recent hospitalization, constipation findings on recent CT, recommended we consider lowering Semaglutide injection.   She wants to stay on  Semaglutide 1mg  weekly for now.   We want to avoid hypoglycemia/low readings under 70 If you get any more of these, we will need to back down dose of Ozempic.   Constipation noted on CT scan with this hospitalization, she continues on Miralax prn.  Discussed recent scan findings, other medications that can contribute to constipation.  COPD with hypoxemia - improved.  While at hospital she remained 95% room air and is not currently on oxygen.  She still has oxygen at home if needed.   Has portable oxygen generator as well.  Has pulse oximetry at home.    Of note, she is leaving back to [facility] after [place].  Plan for labs in 2-3 weeks: BMET Mg TSH  I refilled antibiotic and amitriptyline today since those were last sent to pharmacy down at the [facility].  She will be here another few weeks, so needed refill locally today   Brandi Stephens was seen today for hospitalization follow-up.  Diagnoses and all orders for this visit:  Orthostatic hypotension -     Basic metabolic panel; Future -     Magnesium; Future -     CBC; Future  History of sepsis  Chronic diastolic CHF (congestive  heart failure) (HCC)  Essential hypertension  Chronic constipation  Type II diabetes mellitus with complication (HCC)  Osteomyelitis of lumbar spine (HCC)  AKI (acute kidney injury) ([place]) -     Basic metabolic panel; Future -     CBC; Future  High risk medication use  Other specified hypothyroidism -     TSH; Future  Other orders -     amitriptyline (ELAVIL) 100 MG tablet; Take 1 tablet (100 mg total) by mouth at bedtime. -     cefadroxil (DURICEF) 500 MG capsule; Take 1 capsule (500 mg total) by mouth 2 (two) times daily.    Follow up:  2-3 weeks

## 2022-07-04 NOTE — Patient Instructions (Signed)
We discussed her hospitalization, findings, treatment. Medications reconciled.  I reviewed her discharge summary, imaging, labs, recommendations   Orthostatic hypotension Metoprolol and Bumex discontinued with hospitalization.  Continue to hold OFF on these 2 medications, Metoprolol and Bumex. She currently is continuing Midodrine 10mg  TID. You will need to monitor weights and blood pressures The goal is to keep weight stable where it is, and not gaining fluid weight.   So monitor for any increase of 3-5lb or more in a given.  If you see a weight gain such as this, then call/recheck.  We want to keep BP 110/65 or higher.  You are stable today, but if you see numbers less than this, let us or cardiology know right away.  Of if BPs are rising above 130/80, let us know as well  Diabetes  in light of recent hospitalization, constipation findings on recent CT, recommended we consider lowering Semaglutide injection.   She wants to stay on  Semaglutide 1mg  weekly for now.   We want to avoid hypoglycemia/low readings under 70 If you get any more of these, we will need to back down dose of Ozempic.   Constipation noted on CT scan with this hospitalization, she continues on Miralax prn.  Discussed recent scan findings, other medications that can contribute to constipation.  COPD with hypoxemia - improved.  While at hospital she remained 95% room air and is not currently on oxygen.  She still has oxygen at home if needed.   Has portable oxygen generator as well.  Has pulse oximetry at home.    Of note, she is leaving back to [facility] after [place].  Plan for labs in 2-3 weeks: BMET Mg TSH  I refilled antibiotic and amitriptyline today since those were last sent to pharmacy down at the [facility].  She will be here another few weeks, so needed refill locally today

## 2022-07-04 NOTE — Telephone Encounter (Signed)
Medication management and Disease management- [facility]- [facility] [phone]- Verbal ok per shane 1 x 3 weeks 1 month x 1  2 PRN

## 2022-07-04 NOTE — Telephone Encounter (Signed)
Pt called back after her appt and states that her disability parking placard has expired and she requested a new one get filled out and signed for her. She is coming in for labs in the next few weeks and would like to pick up then. I am sending the form back with form checklist to be completed.

## 2022-07-05 ENCOUNTER — Ambulatory Visit: Payer: Self-pay

## 2022-07-05 ENCOUNTER — Encounter: Payer: Medicare HMO | Attending: Physical Medicine and Rehabilitation | Admitting: Physical Medicine and Rehabilitation

## 2022-07-05 VITALS — BP 107/68 | HR 122 | Ht 67.5 in | Wt 189.0 lb

## 2022-07-05 DIAGNOSIS — A0811 Acute gastroenteropathy due to Norwalk agent: Secondary | ICD-10-CM | POA: Diagnosis not present

## 2022-07-05 DIAGNOSIS — R739 Hyperglycemia, unspecified: Secondary | ICD-10-CM | POA: Insufficient documentation

## 2022-07-05 DIAGNOSIS — I951 Orthostatic hypotension: Secondary | ICD-10-CM | POA: Insufficient documentation

## 2022-07-05 MED ORDER — TOPIRAMATE 25 MG PO TABS: 75.0000 mg | ORAL_TABLET | Freq: Every evening | ORAL | 3 refills | Status: DC

## 2022-07-05 MED ORDER — DIAZEPAM 10 MG PO TABS: 10.0000 mg | ORAL_TABLET | Freq: Every day | ORAL | 3 refills | Status: DC | PRN

## 2022-07-05 MED ORDER — AMITRIPTYLINE HCL 100 MG PO TABS: 100.0000 mg | ORAL_TABLET | Freq: Every day | ORAL | 0 refills | Status: DC

## 2022-07-05 NOTE — Telephone Encounter (Signed)
I have finished filling out form and put that needs to be sent to patient and a copy for chart

## 2022-07-05 NOTE — Addendum Note (Signed)
Addended by: Izora Ribas on: 07/05/2022 01:32 PM   Modules accepted: Orders

## 2022-07-05 NOTE — Chronic Care Management (AMB) (Signed)
   07/05/2022  Brilliant March 17, 1946 [id number]   Reason for Encounter: CCM enrollment status changed to previously enrolled   [place] Manager/Chronic Care Management [phone]

## 2022-07-06 DIAGNOSIS — F32A Depression, unspecified: Secondary | ICD-10-CM | POA: Diagnosis not present

## 2022-07-06 DIAGNOSIS — I951 Orthostatic hypotension: Secondary | ICD-10-CM | POA: Diagnosis not present

## 2022-07-06 DIAGNOSIS — N189 Chronic kidney disease, unspecified: Secondary | ICD-10-CM | POA: Diagnosis not present

## 2022-07-06 DIAGNOSIS — E1140 Type 2 diabetes mellitus with diabetic neuropathy, unspecified: Secondary | ICD-10-CM | POA: Diagnosis not present

## 2022-07-06 DIAGNOSIS — K219 Gastro-esophageal reflux disease without esophagitis: Secondary | ICD-10-CM | POA: Diagnosis not present

## 2022-07-06 DIAGNOSIS — E1122 Type 2 diabetes mellitus with diabetic chronic kidney disease: Secondary | ICD-10-CM | POA: Diagnosis not present

## 2022-07-06 DIAGNOSIS — E669 Obesity, unspecified: Secondary | ICD-10-CM | POA: Diagnosis not present

## 2022-07-06 DIAGNOSIS — Z8616 Personal history of COVID-19: Secondary | ICD-10-CM | POA: Diagnosis not present

## 2022-07-06 DIAGNOSIS — Z6828 Body mass index (BMI) 28.0-28.9, adult: Secondary | ICD-10-CM | POA: Diagnosis not present

## 2022-07-06 DIAGNOSIS — I5032 Chronic diastolic (congestive) heart failure: Secondary | ICD-10-CM | POA: Diagnosis not present

## 2022-07-06 DIAGNOSIS — K5900 Constipation, unspecified: Secondary | ICD-10-CM | POA: Diagnosis not present

## 2022-07-06 DIAGNOSIS — N179 Acute kidney failure, unspecified: Secondary | ICD-10-CM | POA: Diagnosis not present

## 2022-07-06 DIAGNOSIS — G2581 Restless legs syndrome: Secondary | ICD-10-CM | POA: Diagnosis not present

## 2022-07-06 DIAGNOSIS — F419 Anxiety disorder, unspecified: Secondary | ICD-10-CM | POA: Diagnosis not present

## 2022-07-06 DIAGNOSIS — Z9981 Dependence on supplemental oxygen: Secondary | ICD-10-CM | POA: Diagnosis not present

## 2022-07-06 DIAGNOSIS — I130 Hypertensive heart and chronic kidney disease with heart failure and stage 1 through stage 4 chronic kidney disease, or unspecified chronic kidney disease: Secondary | ICD-10-CM | POA: Diagnosis not present

## 2022-07-06 DIAGNOSIS — Z96651 Presence of right artificial knee joint: Secondary | ICD-10-CM | POA: Diagnosis not present

## 2022-07-06 DIAGNOSIS — M1030 Gout due to renal impairment, unspecified site: Secondary | ICD-10-CM | POA: Diagnosis not present

## 2022-07-06 DIAGNOSIS — D631 Anemia in chronic kidney disease: Secondary | ICD-10-CM | POA: Diagnosis not present

## 2022-07-06 DIAGNOSIS — M48061 Spinal stenosis, lumbar region without neurogenic claudication: Secondary | ICD-10-CM | POA: Diagnosis not present

## 2022-07-06 DIAGNOSIS — E079 Disorder of thyroid, unspecified: Secondary | ICD-10-CM | POA: Diagnosis not present

## 2022-07-06 DIAGNOSIS — E872 Acidosis, unspecified: Secondary | ICD-10-CM | POA: Diagnosis not present

## 2022-07-06 DIAGNOSIS — J449 Chronic obstructive pulmonary disease, unspecified: Secondary | ICD-10-CM | POA: Diagnosis not present

## 2022-07-06 DIAGNOSIS — E785 Hyperlipidemia, unspecified: Secondary | ICD-10-CM | POA: Diagnosis not present

## 2022-07-08 ENCOUNTER — Telehealth: Payer: Self-pay

## 2022-07-08 NOTE — Telephone Encounter (Signed)
OK to send orders? 

## 2022-07-08 NOTE — Telephone Encounter (Signed)
Erin called requesting verbal orders for home health PT once week 1 and twice week 2. CB [phone] .

## 2022-07-08 NOTE — Telephone Encounter (Signed)
Spoke with pt. Pt was notified of her monitor results. Pt canceled her 07/19/22 apt with Diona Browner NP due to being out of town with her daughter. Pt r/s 10/07/22 @ 1:30 pm. If that date doesn't work with pt, she will call back.

## 2022-07-12 DIAGNOSIS — E079 Disorder of thyroid, unspecified: Secondary | ICD-10-CM | POA: Diagnosis not present

## 2022-07-12 DIAGNOSIS — G2581 Restless legs syndrome: Secondary | ICD-10-CM | POA: Diagnosis not present

## 2022-07-12 DIAGNOSIS — E872 Acidosis, unspecified: Secondary | ICD-10-CM | POA: Diagnosis not present

## 2022-07-12 DIAGNOSIS — Z96651 Presence of right artificial knee joint: Secondary | ICD-10-CM | POA: Diagnosis not present

## 2022-07-12 DIAGNOSIS — E785 Hyperlipidemia, unspecified: Secondary | ICD-10-CM | POA: Diagnosis not present

## 2022-07-12 DIAGNOSIS — F419 Anxiety disorder, unspecified: Secondary | ICD-10-CM | POA: Diagnosis not present

## 2022-07-12 DIAGNOSIS — E1122 Type 2 diabetes mellitus with diabetic chronic kidney disease: Secondary | ICD-10-CM | POA: Diagnosis not present

## 2022-07-12 DIAGNOSIS — F32A Depression, unspecified: Secondary | ICD-10-CM | POA: Diagnosis not present

## 2022-07-12 DIAGNOSIS — I951 Orthostatic hypotension: Secondary | ICD-10-CM | POA: Diagnosis not present

## 2022-07-12 DIAGNOSIS — Z6828 Body mass index (BMI) 28.0-28.9, adult: Secondary | ICD-10-CM | POA: Diagnosis not present

## 2022-07-12 DIAGNOSIS — M48061 Spinal stenosis, lumbar region without neurogenic claudication: Secondary | ICD-10-CM | POA: Diagnosis not present

## 2022-07-12 DIAGNOSIS — E669 Obesity, unspecified: Secondary | ICD-10-CM | POA: Diagnosis not present

## 2022-07-12 DIAGNOSIS — M1030 Gout due to renal impairment, unspecified site: Secondary | ICD-10-CM | POA: Diagnosis not present

## 2022-07-12 DIAGNOSIS — D631 Anemia in chronic kidney disease: Secondary | ICD-10-CM | POA: Diagnosis not present

## 2022-07-12 DIAGNOSIS — E1140 Type 2 diabetes mellitus with diabetic neuropathy, unspecified: Secondary | ICD-10-CM | POA: Diagnosis not present

## 2022-07-12 DIAGNOSIS — N189 Chronic kidney disease, unspecified: Secondary | ICD-10-CM | POA: Diagnosis not present

## 2022-07-12 DIAGNOSIS — I130 Hypertensive heart and chronic kidney disease with heart failure and stage 1 through stage 4 chronic kidney disease, or unspecified chronic kidney disease: Secondary | ICD-10-CM | POA: Diagnosis not present

## 2022-07-12 DIAGNOSIS — J449 Chronic obstructive pulmonary disease, unspecified: Secondary | ICD-10-CM | POA: Diagnosis not present

## 2022-07-12 DIAGNOSIS — Z8616 Personal history of COVID-19: Secondary | ICD-10-CM | POA: Diagnosis not present

## 2022-07-12 DIAGNOSIS — Z9981 Dependence on supplemental oxygen: Secondary | ICD-10-CM | POA: Diagnosis not present

## 2022-07-12 DIAGNOSIS — I5032 Chronic diastolic (congestive) heart failure: Secondary | ICD-10-CM | POA: Diagnosis not present

## 2022-07-12 DIAGNOSIS — N179 Acute kidney failure, unspecified: Secondary | ICD-10-CM | POA: Diagnosis not present

## 2022-07-12 DIAGNOSIS — K219 Gastro-esophageal reflux disease without esophagitis: Secondary | ICD-10-CM | POA: Diagnosis not present

## 2022-07-12 DIAGNOSIS — K5900 Constipation, unspecified: Secondary | ICD-10-CM | POA: Diagnosis not present

## 2022-07-13 DIAGNOSIS — G2581 Restless legs syndrome: Secondary | ICD-10-CM | POA: Diagnosis not present

## 2022-07-13 DIAGNOSIS — E1140 Type 2 diabetes mellitus with diabetic neuropathy, unspecified: Secondary | ICD-10-CM | POA: Diagnosis not present

## 2022-07-13 DIAGNOSIS — Z8616 Personal history of COVID-19: Secondary | ICD-10-CM | POA: Diagnosis not present

## 2022-07-13 DIAGNOSIS — J449 Chronic obstructive pulmonary disease, unspecified: Secondary | ICD-10-CM | POA: Diagnosis not present

## 2022-07-13 DIAGNOSIS — F32A Depression, unspecified: Secondary | ICD-10-CM | POA: Diagnosis not present

## 2022-07-13 DIAGNOSIS — Z96651 Presence of right artificial knee joint: Secondary | ICD-10-CM | POA: Diagnosis not present

## 2022-07-13 DIAGNOSIS — Z6828 Body mass index (BMI) 28.0-28.9, adult: Secondary | ICD-10-CM | POA: Diagnosis not present

## 2022-07-13 DIAGNOSIS — I951 Orthostatic hypotension: Secondary | ICD-10-CM | POA: Diagnosis not present

## 2022-07-13 DIAGNOSIS — E669 Obesity, unspecified: Secondary | ICD-10-CM | POA: Diagnosis not present

## 2022-07-13 DIAGNOSIS — I130 Hypertensive heart and chronic kidney disease with heart failure and stage 1 through stage 4 chronic kidney disease, or unspecified chronic kidney disease: Secondary | ICD-10-CM | POA: Diagnosis not present

## 2022-07-13 DIAGNOSIS — M1030 Gout due to renal impairment, unspecified site: Secondary | ICD-10-CM | POA: Diagnosis not present

## 2022-07-13 DIAGNOSIS — D631 Anemia in chronic kidney disease: Secondary | ICD-10-CM | POA: Diagnosis not present

## 2022-07-13 DIAGNOSIS — E872 Acidosis, unspecified: Secondary | ICD-10-CM | POA: Diagnosis not present

## 2022-07-13 DIAGNOSIS — K219 Gastro-esophageal reflux disease without esophagitis: Secondary | ICD-10-CM | POA: Diagnosis not present

## 2022-07-13 DIAGNOSIS — E079 Disorder of thyroid, unspecified: Secondary | ICD-10-CM | POA: Diagnosis not present

## 2022-07-13 DIAGNOSIS — E785 Hyperlipidemia, unspecified: Secondary | ICD-10-CM | POA: Diagnosis not present

## 2022-07-13 DIAGNOSIS — M48061 Spinal stenosis, lumbar region without neurogenic claudication: Secondary | ICD-10-CM | POA: Diagnosis not present

## 2022-07-13 DIAGNOSIS — F419 Anxiety disorder, unspecified: Secondary | ICD-10-CM | POA: Diagnosis not present

## 2022-07-13 DIAGNOSIS — E1122 Type 2 diabetes mellitus with diabetic chronic kidney disease: Secondary | ICD-10-CM | POA: Diagnosis not present

## 2022-07-13 DIAGNOSIS — Z9981 Dependence on supplemental oxygen: Secondary | ICD-10-CM | POA: Diagnosis not present

## 2022-07-13 DIAGNOSIS — I5032 Chronic diastolic (congestive) heart failure: Secondary | ICD-10-CM | POA: Diagnosis not present

## 2022-07-13 DIAGNOSIS — N179 Acute kidney failure, unspecified: Secondary | ICD-10-CM | POA: Diagnosis not present

## 2022-07-13 DIAGNOSIS — K5900 Constipation, unspecified: Secondary | ICD-10-CM | POA: Diagnosis not present

## 2022-07-13 DIAGNOSIS — N189 Chronic kidney disease, unspecified: Secondary | ICD-10-CM | POA: Diagnosis not present

## 2022-07-14 ENCOUNTER — Telehealth: Payer: Self-pay

## 2022-07-14 ENCOUNTER — Encounter: Payer: Self-pay | Admitting: Internal Medicine

## 2022-07-14 ENCOUNTER — Other Ambulatory Visit: Payer: Self-pay

## 2022-07-14 ENCOUNTER — Ambulatory Visit: Payer: Medicare HMO | Admitting: Internal Medicine

## 2022-07-14 VITALS — BP 102/74 | HR 109 | Temp 98.0°F | Ht 68.0 in | Wt 174.0 lb

## 2022-07-14 DIAGNOSIS — M4626 Osteomyelitis of vertebra, lumbar region: Secondary | ICD-10-CM

## 2022-07-14 MED ORDER — CEFADROXIL 500 MG PO CAPS: 500.0000 mg | ORAL_CAPSULE | Freq: Two times a day (BID) | ORAL | 11 refills | Status: DC

## 2022-07-14 NOTE — Assessment & Plan Note (Signed)
The lucency seen around her L1 pedicle screws and recent elevated C-reactive protein raise continued concerned about the possibility of persistent lumbar infection that is simply being suppressed by cefadroxil.  They are well aware that the only test of cure would be to stop cefadroxil and see what happens but there would be risk of relapse.  She has no interest in stopping suppressive antibiotic therapy at this time.  She has recently moved to [place] with her husband.  I will continue cefadroxil and arrange a phone visit for her in 6 months.

## 2022-07-14 NOTE — Progress Notes (Unsigned)
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[place]) 06/25/2019    Priority: High   Severe sepsis with acute organ dysfunction ([place]) 06/20/2022   Enlarged and hypertrophic nails 04/07/2022   Dizziness 04/07/2022   Postmenopausal 04/07/2022   Mixed dyslipidemia 04/07/2022   Estrogen deficiency 04/07/2022   Autoimmune disease ([place]) 04/07/2022   Edema 04/07/2022   Chronic venous insufficiency 04/07/2022   Polypharmacy 01/05/2022   High risk medication use 01/05/2022   Dyslipidemia associated with type 2 diabetes mellitus ([place]) 11/19/2021   Frequent falls 08/05/2021   Elevated uric acid in blood 08/05/2021   Vitamin deficiency 08/05/2021   Chronic hypoxemic respiratory failure ([place]) 08/05/2021   Type II diabetes mellitus with complication ([place]) [id number]   Other chronic pain 08/05/2021   Vaccine counseling 08/05/2021   Hypoxemia 07/02/2021   Morbid obesity ([place])    Short-term memory loss 02/24/2020   Osteopenia 01/22/2020   Aortic atherosclerosis ([place]) 01/22/2020   Insomnia 10/29/2019   Confusion, postoperative    Chronic constipation    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   Muscle spasm    Hypokalemia    Postoperative pain    Labile blood pressure    Hypoalbuminemia due to protein-calorie malnutrition ([place])    Anxiety 07/05/2019   Depression 07/05/2019   Chronic diastolic CHF (congestive heart failure) ([place]) 07/05/2019   Obesity (BMI 30-39.9) 07/05/2019   Lumbar radiculopathy 06/28/2019   Spinal stenosis of lumbar region 06/28/2019   Tremors of nervous system    Chronic pain syndrome    Muscular deconditioning    Nausea 06/24/2019   History of sepsis 06/24/2019   Recent major surgery 06/24/2019   History of diverticulosis 06/24/2019   Elevated PTHrP level 05/09/2019    Low back pain 02/12/2019   Neuropathy 12/20/2018   Lumbar spinal stenosis 06/29/2018   Abnormal CT of the chest 06/04/2018   Hypothyroidism 11/30/2017   Thoracic aortic atherosclerosis ([place]) 09/14/2016   RLS (restless legs syndrome) 09/09/2016   H/O adenomatous polyp of colon 07/06/2016   PVC's (premature ventricular contractions) 03/29/2016   Grade I diastolic dysfunction 0000000   Former cigarette smoker 01/11/2016   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   Leg edema 01/11/2016   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   History of left breast cancer 07/14/2015   AKI (acute kidney injury) ([place]) 07/08/2015   Dependent edema 06/08/2015   Sleep disturbance 06/08/2015   Mixed hyperlipidemia 06/08/2015   Generalized anxiety disorder 06/08/2015   Essential hypertension 06/08/2015   COPD (chronic obstructive pulmonary disease) ([place]) 06/08/2015   Gout 06/08/2015   Macrocytic anemia    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   AMITRIPTYLINE (ELAVIL) 100 MG TABLET    Take 1 tablet (100 mg total) by mouth at bedtime.   ATORVASTATIN (LIPITOR) 80 MG TABLET    Take 1 tablet (80 mg total) by mouth daily.   BIOTIN 3 MG TABS    Take 3 mg by mouth daily.   BLOOD GLUCOSE METER KIT AND SUPPLIES KIT    Dispense based  on patient and insurance preference. Use up to four times daily as directed.   CHOLECALCIFEROL (VITAMIN D3) 50 MCG (2000 UT) CAPSULE    Take 1 capsule (2,000 Units total) by mouth daily.   COLCHICINE 0.6 MG TABLET    Take 2 tablets at once then take 1 tablet an hour later then then 1 tablet twice daily x 3 days.   CONTINUOUS BLOOD GLUC SENSOR (FREESTYLE LIBRE 3 SENSOR) MISC    Place 1 sensor on the skin every 14 days. Use to check glucose continuously   DIAZEPAM  (VALIUM) 10 MG TABLET    Take 1 tablet (10 mg total) by mouth daily as needed for anxiety.   DULOXETINE (CYMBALTA) 60 MG CAPSULE    TAKE 1 CAPSULE(60 MG) BY MOUTH DAILY   FLUTICASONE-UMECLIDIN-VILANT ([name] ELLIPTA) 100-62.5-25 MCG/ACT AEPB    Inhale 1 puff into the lungs daily.   HYDROCODONE-ACETAMINOPHEN (NORCO/VICODIN) 5-325 MG TABLET    Take 1 tablet by mouth every 4 (four) hours as needed for moderate pain.   LEVOTHYROXINE (SYNTHROID) 100 MCG TABLET    Take 1 tablet (100 mcg total) by mouth daily.   MAGNESIUM GLUCONATE 250 MG TABS    Take 1 tablet by mouth daily.   MIDODRINE (PROAMATINE) 10 MG TABLET    Take 1 tablet (10 mg total) by mouth 3 (three) times daily with meals.   OXYGEN    Inhale 2 L into the lungs.   PANTOPRAZOLE (PROTONIX) 40 MG TABLET    Take 1 tablet (40 mg total) by mouth daily for 14 days.   POLYETHYLENE GLYCOL POWDER (GLYCOLAX/MIRALAX) 17 GM/SCOOP POWDER    Take 17 g by mouth daily as needed for moderate constipation.   PRIMIDONE (MYSOLINE) 50 MG TABLET    TAKE 1 TABLET BY MOUTH TWICE DAILY   PROBIOTIC PRODUCT (PROBIOTIC DAILY PO)    Take 1 tablet by mouth daily.   PROPYLENE GLYCOL (SYSTANE BALANCE OP)    Place 1 drop into both eyes 4 (four) times daily as needed (dry eyes).   ROPINIROLE (REQUIP) 1 MG TABLET    TAKE 1 TABLET(1 MG) BY MOUTH AT BEDTIME   SEMAGLUTIDE, 1 MG/DOSE, 4 MG/3ML SOPN    Inject 1 mg as directed once a week.   TOPIRAMATE (TOPAMAX) 25 MG TABLET    Take 3 tablets (75 mg total) by mouth at bedtime.   TRIAMCINOLONE OINTMENT (KENALOG) 0.1 %    Apply 1 Application topically 2 (two) times daily.  Modified Medications   Modified Medication Previous Medication   CEFADROXIL (DURICEF) 500 MG CAPSULE cefadroxil (DURICEF) 500 MG capsule      Take 1 capsule (500 mg total) by mouth 2 (two) times daily.    Take 1 capsule (500 mg total) by mouth 2 (two) times daily.  Discontinued Medications   No medications on file    Subjective: Brandi Stephens is in for her routine  follow-up visit.  She developed postoperative methicillin sensitive coag negative staph lumbar infection in March 2021.  She had hardware replaced 1 month later.  She completed a course of IV antibiotics for before switching to chronic suppressive antibiotic therapy.  She is currently taking cefadroxil.  She was hospitalized earlier this month with sepsis found to be due to norovirus gastroenteritis.  While hospitalized she underwent a lumbar CT scan which showed some probable increased compression deformity of T12 and a significant lucency around the bilateral L1 fixation screws concerning for loosening.  She had been having increasing falls leading up to  her hospitalization.  She was found to have orthostatic hypotension while hospitalized and was started on midodrine.  Her dizziness has improved and she has not had any further falls since discharge.  Her severe nausea and vomiting resolved while she was hospitalized.  Her chronic back pain remains at baseline around 5-6 out of 10.  She denies any problems tolerating cefadroxil.  Stool C. difficile antigen test was negative while hospitalized.    Review of Systems: Review of Systems  Constitutional:  Positive for weight loss. Negative for fever.  Gastrointestinal:  Negative for abdominal pain, diarrhea, nausea and vomiting.  Musculoskeletal:  Positive for back pain and falls.    Past Medical History:  Diagnosis Date   AIN (anal intraepithelial neoplasia) anal canal    Anemia    with pregnancy   Anxiety    Arthritis    knees, lower back, knees   Chronic constipation    Chronic diastolic CHF (congestive heart failure) ([place]) 07/05/2019   CKD (chronic kidney disease)    Coarse tremors    Essential   COPD (chronic obstructive pulmonary disease) ([place])    2017 chest CT, history of   Degenerative lumbar spinal stenosis    Depression    Depression 07/05/2019   Drug dependency ([place])    Elevated PTHrP level 05/09/2019   EtOH use  07/05/2019   Pt denies heavy drinking however   GERD (gastroesophageal reflux disease)    pt denies   Gout    Grade I diastolic dysfunction 0000000   Noted ECHO   Hemorrhoids    History of adenomatous polyp of colon    History of basal cell carcinoma (BCC) excision 2015   nose   History of sepsis 05/14/2014   post lumbar surgery   History of shingles    x2   Hyperlipidemia    Hypertension    Hypothyroidism    Irregular heart beat    history of while undergoing radiation   Malignant neoplasm of upper-inner quadrant of left breast in female, estrogen receptor positive [facility]) oncologist-  dr Jana Hakim--  per lov in epic no recurrence   dx 07-13-2015  Left breast invasive ductal carcinoma, Stage IA, Grade 1 (TXN0),  09-16-2015  s/p  left breast lumpectomy with sln bx's,  completed radiation 11-18-2015,  started antiestrogen therapy 12-14-2015   Neuropathy    Left foot and back of left leg   Obesity (BMI 30-39.9) 07/05/2019   Personal history of radiation therapy    completed 11-18-2015  left breast   Pneumonia    PONV (postoperative nausea and vomiting)    Prediabetes    diet controlled, no med   Restless leg syndrome    Seasonal allergies    Seizure ([place]) 05/2015   due to sepsis, only one time episode   Tremor    Wears partial dentures    lower     Social History   Tobacco Use   Smoking status: Former    Packs/day: 0.50    Years: 35.00    Additional pack years: 0.00    Total pack years: 17.50    Types: Cigarettes    Quit date: 05/22/2018    Years since quitting: 4.1   Smokeless tobacco: Never  Vaping Use   Vaping Use: Never used  Substance Use Topics   Alcohol use: Yes    Comment: 1 glass of wine a night   Drug use: Not Currently    Comment: CBD tincture    Family History  Problem  Relation Age of Onset   Atrial fibrillation Mother    Ulcers Father    Breast cancer Maternal Aunt    Lung cancer Maternal Uncle    Stomach cancer Maternal Grandmother    Lung  cancer Maternal Aunt    Brain cancer Maternal Aunt    Prostate cancer Maternal Grandfather    Stroke Paternal Grandmother    Tuberculosis Paternal Grandfather    Colon cancer Neg Hx    Esophageal cancer Neg Hx    Rectal cancer Neg Hx     Allergies  Allergen Reactions   Ampicillin Hives and Other (See Comments)    Severe reaction in February 2017 1.5 month to have hives to go away   Gabapentin Swelling    UNSPECIFIED REACTION    Penicillins Hives and Other (See Comments)    Severe reaction in February 2017 1.5 month to have hives to go away Has patient had a PCN reaction causing immediate rash, facial/tongue/throat swelling, SOB or lightheadedness with hypotension: #  #  #  YES  #  #  #  Has patient had a PCN reaction causing severe rash involving mucus membranes or skin necrosis: No Has patient had a PCN reaction that required hospitalization No Has patient had a PCN reaction occurring within the last 10 years: No    Other Itching    UNSPECIFIED Analgesics    Objective: Vitals:   07/14/22 1101  BP: 102/74  Pulse: (!) 109  Temp: 98 F (36.7 C)  Weight: 174 lb (78.9 kg)  Height: 5\' 8"  (1.727 m)   Body mass index is 26.46 kg/m.  Physical Exam Constitutional:      Comments: She is seated in wheelchair.  She is in good spirits.  She has lost 77 pounds on Ozempic over the past year.  She is accompanied by her daughter and 1 granddaughter.  Cardiovascular:     Rate and Rhythm: Normal rate.  Pulmonary:     Effort: Pulmonary effort is normal.  Psychiatric:        Mood and Affect: Mood normal.     Lumbar CT scan 06/23/2022  IMPRESSION: 1. Status post posterior fusion and decompression L1-S1, with significant lucency about the bilateral L1 screws, concerning for loosening. Beam hardening artifact from the hardware limits evaluation of these levels, however no definite spinal canal stenosis is seen. 2. Redemonstrated compression deformity of T12, with likely  similar vertebral body height loss compared to the 08/20/2021 MRI, when accounting for differences in technique. 3. Right eccentric osteophyte formation at L3-L4 likely narrows the right lateral recess. Mild right neural foraminal narrowing. 4. Moderate bilateral neural foraminal narrowing at L1-L2. 5. Aortic atherosclerosis.  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  They are well aware that the only test of cure would be to stop cefadroxil and see what happens but there would be risk of relapse.  She has no interest in stopping suppressive antibiotic therapy at this time.  She has recently moved to [facility] with her husband.  I will continue cefadroxil and arrange a phone visit for her in 6 months.  Relevant Medications   cefadroxil (DURICEF) 500 MG capsule     [name], MD [facility] for Infectious [place] [phone] pager   [phone] cell 07/14/2022, 11:30 AM

## 2022-07-14 NOTE — Telephone Encounter (Signed)
Cefadroxil was not electronically transmitted to pharmacy, called to give verbal order. Unable to get pharmacy representative on the phone at this time.   Beryle Flock, RN

## 2022-07-18 ENCOUNTER — Inpatient Hospital Stay (HOSPITAL_BASED_OUTPATIENT_CLINIC_OR_DEPARTMENT_OTHER)
Admission: EM | Admit: 2022-07-18 | Discharge: 2022-07-24 | DRG: 392 | Disposition: A | Discharge status: Home / Self Care | Payer: Medicare HMO | Attending: Internal Medicine | Admitting: Internal Medicine

## 2022-07-18 ENCOUNTER — Emergency Department (HOSPITAL_BASED_OUTPATIENT_CLINIC_OR_DEPARTMENT_OTHER): Payer: Medicare HMO | Admitting: Radiology

## 2022-07-18 ENCOUNTER — Other Ambulatory Visit (HOSPITAL_COMMUNITY): Payer: Medicare HMO

## 2022-07-18 ENCOUNTER — Encounter (HOSPITAL_BASED_OUTPATIENT_CLINIC_OR_DEPARTMENT_OTHER): Payer: Self-pay

## 2022-07-18 ENCOUNTER — Other Ambulatory Visit: Payer: Self-pay

## 2022-07-18 ENCOUNTER — Telehealth: Payer: Self-pay | Admitting: Internal Medicine

## 2022-07-18 DIAGNOSIS — D175 Benign lipomatous neoplasm of intra-abdominal organs: Secondary | ICD-10-CM | POA: Diagnosis present

## 2022-07-18 DIAGNOSIS — E1169 Type 2 diabetes mellitus with other specified complication: Secondary | ICD-10-CM | POA: Diagnosis not present

## 2022-07-18 DIAGNOSIS — Z9049 Acquired absence of other specified parts of digestive tract: Secondary | ICD-10-CM

## 2022-07-18 DIAGNOSIS — E860 Dehydration: Secondary | ICD-10-CM | POA: Diagnosis not present

## 2022-07-18 DIAGNOSIS — R319 Hematuria, unspecified: Secondary | ICD-10-CM | POA: Diagnosis not present

## 2022-07-18 DIAGNOSIS — Z86010 Personal history of colonic polyps: Secondary | ICD-10-CM | POA: Diagnosis not present

## 2022-07-18 DIAGNOSIS — N3000 Acute cystitis without hematuria: Secondary | ICD-10-CM | POA: Diagnosis present

## 2022-07-18 DIAGNOSIS — E1129 Type 2 diabetes mellitus with other diabetic kidney complication: Secondary | ICD-10-CM | POA: Diagnosis not present

## 2022-07-18 DIAGNOSIS — R634 Abnormal weight loss: Secondary | ICD-10-CM | POA: Diagnosis not present

## 2022-07-18 DIAGNOSIS — I130 Hypertensive heart and chronic kidney disease with heart failure and stage 1 through stage 4 chronic kidney disease, or unspecified chronic kidney disease: Secondary | ICD-10-CM | POA: Diagnosis not present

## 2022-07-18 DIAGNOSIS — Z803 Family history of malignant neoplasm of breast: Secondary | ICD-10-CM

## 2022-07-18 DIAGNOSIS — I5032 Chronic diastolic (congestive) heart failure: Secondary | ICD-10-CM | POA: Diagnosis not present

## 2022-07-18 DIAGNOSIS — K909 Intestinal malabsorption, unspecified: Secondary | ICD-10-CM | POA: Diagnosis not present

## 2022-07-18 DIAGNOSIS — Z0389 Encounter for observation for other suspected diseases and conditions ruled out: Secondary | ICD-10-CM | POA: Diagnosis not present

## 2022-07-18 DIAGNOSIS — R112 Nausea with vomiting, unspecified: Secondary | ICD-10-CM | POA: Diagnosis not present

## 2022-07-18 DIAGNOSIS — I10 Essential (primary) hypertension: Secondary | ICD-10-CM | POA: Diagnosis not present

## 2022-07-18 DIAGNOSIS — N390 Urinary tract infection, site not specified: Secondary | ICD-10-CM | POA: Diagnosis not present

## 2022-07-18 DIAGNOSIS — Z923 Personal history of irradiation: Secondary | ICD-10-CM

## 2022-07-18 DIAGNOSIS — F419 Anxiety disorder, unspecified: Secondary | ICD-10-CM | POA: Diagnosis present

## 2022-07-18 DIAGNOSIS — K2900 Acute gastritis without bleeding: Secondary | ICD-10-CM | POA: Diagnosis not present

## 2022-07-18 DIAGNOSIS — N181 Chronic kidney disease, stage 1: Secondary | ICD-10-CM | POA: Diagnosis not present

## 2022-07-18 DIAGNOSIS — Z79890 Hormone replacement therapy (postmenopausal): Secondary | ICD-10-CM

## 2022-07-18 DIAGNOSIS — Z791 Long term (current) use of non-steroidal anti-inflammatories (NSAID): Secondary | ICD-10-CM

## 2022-07-18 DIAGNOSIS — B9561 Methicillin susceptible Staphylococcus aureus infection as the cause of diseases classified elsewhere: Secondary | ICD-10-CM | POA: Diagnosis present

## 2022-07-18 DIAGNOSIS — E039 Hypothyroidism, unspecified: Secondary | ICD-10-CM | POA: Diagnosis present

## 2022-07-18 DIAGNOSIS — Z7985 Long-term (current) use of injectable non-insulin antidiabetic drugs: Secondary | ICD-10-CM

## 2022-07-18 DIAGNOSIS — I951 Orthostatic hypotension: Secondary | ICD-10-CM | POA: Diagnosis not present

## 2022-07-18 DIAGNOSIS — R296 Repeated falls: Secondary | ICD-10-CM | POA: Diagnosis not present

## 2022-07-18 DIAGNOSIS — M109 Gout, unspecified: Secondary | ICD-10-CM | POA: Diagnosis present

## 2022-07-18 DIAGNOSIS — J449 Chronic obstructive pulmonary disease, unspecified: Secondary | ICD-10-CM | POA: Diagnosis not present

## 2022-07-18 DIAGNOSIS — E119 Type 2 diabetes mellitus without complications: Secondary | ICD-10-CM

## 2022-07-18 DIAGNOSIS — G2581 Restless legs syndrome: Secondary | ICD-10-CM | POA: Diagnosis present

## 2022-07-18 DIAGNOSIS — Z794 Long term (current) use of insulin: Secondary | ICD-10-CM

## 2022-07-18 DIAGNOSIS — K2950 Unspecified chronic gastritis without bleeding: Secondary | ICD-10-CM | POA: Diagnosis not present

## 2022-07-18 DIAGNOSIS — K5909 Other constipation: Secondary | ICD-10-CM | POA: Diagnosis present

## 2022-07-18 DIAGNOSIS — E1122 Type 2 diabetes mellitus with diabetic chronic kidney disease: Secondary | ICD-10-CM | POA: Diagnosis present

## 2022-07-18 DIAGNOSIS — E785 Hyperlipidemia, unspecified: Secondary | ICD-10-CM | POA: Diagnosis not present

## 2022-07-18 DIAGNOSIS — Z87891 Personal history of nicotine dependence: Secondary | ICD-10-CM

## 2022-07-18 DIAGNOSIS — K317 Polyp of stomach and duodenum: Secondary | ICD-10-CM | POA: Diagnosis not present

## 2022-07-18 DIAGNOSIS — N3001 Acute cystitis with hematuria: Secondary | ICD-10-CM | POA: Diagnosis not present

## 2022-07-18 DIAGNOSIS — M4626 Osteomyelitis of vertebra, lumbar region: Secondary | ICD-10-CM | POA: Diagnosis not present

## 2022-07-18 DIAGNOSIS — K219 Gastro-esophageal reflux disease without esophagitis: Secondary | ICD-10-CM | POA: Diagnosis not present

## 2022-07-18 DIAGNOSIS — Z888 Allergy status to other drugs, medicaments and biological substances status: Secondary | ICD-10-CM

## 2022-07-18 DIAGNOSIS — T383X5A Adverse effect of insulin and oral hypoglycemic [antidiabetic] drugs, initial encounter: Secondary | ICD-10-CM | POA: Diagnosis present

## 2022-07-18 DIAGNOSIS — Z880 Allergy status to penicillin: Secondary | ICD-10-CM

## 2022-07-18 DIAGNOSIS — E872 Acidosis, unspecified: Principal | ICD-10-CM

## 2022-07-18 DIAGNOSIS — E876 Hypokalemia: Secondary | ICD-10-CM | POA: Diagnosis present

## 2022-07-18 DIAGNOSIS — E038 Other specified hypothyroidism: Secondary | ICD-10-CM | POA: Diagnosis not present

## 2022-07-18 DIAGNOSIS — M1990 Unspecified osteoarthritis, unspecified site: Secondary | ICD-10-CM | POA: Diagnosis present

## 2022-07-18 DIAGNOSIS — F32A Depression, unspecified: Secondary | ICD-10-CM | POA: Diagnosis present

## 2022-07-18 DIAGNOSIS — Z85828 Personal history of other malignant neoplasm of skin: Secondary | ICD-10-CM

## 2022-07-18 DIAGNOSIS — K2100 Gastro-esophageal reflux disease with esophagitis, without bleeding: Secondary | ICD-10-CM | POA: Diagnosis not present

## 2022-07-18 DIAGNOSIS — Z7984 Long term (current) use of oral hypoglycemic drugs: Secondary | ICD-10-CM

## 2022-07-18 DIAGNOSIS — K3189 Other diseases of stomach and duodenum: Secondary | ICD-10-CM | POA: Diagnosis not present

## 2022-07-18 DIAGNOSIS — Z792 Long term (current) use of antibiotics: Secondary | ICD-10-CM

## 2022-07-18 DIAGNOSIS — A419 Sepsis, unspecified organism: Secondary | ICD-10-CM | POA: Diagnosis present

## 2022-07-18 DIAGNOSIS — Z853 Personal history of malignant neoplasm of breast: Secondary | ICD-10-CM

## 2022-07-18 DIAGNOSIS — E1140 Type 2 diabetes mellitus with diabetic neuropathy, unspecified: Secondary | ICD-10-CM | POA: Diagnosis not present

## 2022-07-18 DIAGNOSIS — Q402 Other specified congenital malformations of stomach: Secondary | ICD-10-CM | POA: Diagnosis not present

## 2022-07-18 DIAGNOSIS — Z961 Presence of intraocular lens: Secondary | ICD-10-CM | POA: Diagnosis present

## 2022-07-18 DIAGNOSIS — Z881 Allergy status to other antibiotic agents status: Secondary | ICD-10-CM

## 2022-07-18 DIAGNOSIS — Z7951 Long term (current) use of inhaled steroids: Secondary | ICD-10-CM

## 2022-07-18 DIAGNOSIS — Z79899 Other long term (current) drug therapy: Secondary | ICD-10-CM

## 2022-07-18 DIAGNOSIS — S0003XA Contusion of scalp, initial encounter: Secondary | ICD-10-CM | POA: Diagnosis not present

## 2022-07-18 LAB — CBC
HCT: 43.5 % (ref 36.0–46.0)
Hemoglobin: 14.3 g/dL (ref 12.0–15.0)
MCH: 31 pg (ref 26.0–34.0)
MCHC: 32.9 g/dL (ref 30.0–36.0)
MCV: 94.4 fL (ref 80.0–100.0)
Platelets: 280 10*3/uL (ref 150–400)
RBC: 4.61 MIL/uL (ref 3.87–5.11)
RDW: 15.4 % (ref 11.5–15.5)
WBC: 6.8 10*3/uL (ref 4.0–10.5)
nRBC: 0 % (ref 0.0–0.2)

## 2022-07-18 LAB — URINALYSIS, ROUTINE W REFLEX MICROSCOPIC
Bilirubin Urine: NEGATIVE
Glucose, UA: NEGATIVE mg/dL
Ketones, ur: NEGATIVE mg/dL
Nitrite: NEGATIVE
Protein, ur: 30 mg/dL — AB
Specific Gravity, Urine: 1.041 — ABNORMAL HIGH (ref 1.005–1.030)
Squamous Epithelial / HPF: >50 /HPF (ref 0–5)
WBC, UA: >50 WBC/hpf (ref 0–5)
pH: 5.5 (ref 5.0–8.0)

## 2022-07-18 LAB — PROTIME-INR
INR: 1 (ref 0.8–1.2)
Prothrombin Time: 13.4 seconds (ref 11.4–15.2)

## 2022-07-18 LAB — COMPREHENSIVE METABOLIC PANEL
ALT: 13 U/L (ref 0–44)
AST: 19 U/L (ref 15–41)
Albumin: 3.9 g/dL (ref 3.5–5.0)
Alkaline Phosphatase: 77 U/L (ref 38–126)
Anion gap: 13 (ref 5–15)
BUN: 23 mg/dL (ref 8–23)
CO2: 26 mmol/L (ref 22–32)
Calcium: 10.1 mg/dL (ref 8.9–10.3)
Chloride: 105 mmol/L (ref 98–111)
Creatinine, Ser: 1.11 mg/dL — ABNORMAL HIGH (ref 0.44–1.00)
GFR, Estimated: 52 mL/min — ABNORMAL LOW (ref >60)
Glucose, Bld: 119 mg/dL — ABNORMAL HIGH (ref 70–99)
Potassium: 3.3 mmol/L — ABNORMAL LOW (ref 3.5–5.1)
Sodium: 144 mmol/L (ref 135–145)
Total Bilirubin: 0.7 mg/dL (ref 0.3–1.2)
Total Protein: 6.5 g/dL (ref 6.5–8.1)

## 2022-07-18 LAB — LIPASE, BLOOD: Lipase: 96 U/L — ABNORMAL HIGH (ref 11–51)

## 2022-07-18 LAB — GLUCOSE, CAPILLARY: Glucose-Capillary: 74 mg/dL (ref 70–99)

## 2022-07-18 LAB — LACTIC ACID, PLASMA
Lactic Acid, Venous: 2.1 mmol/L (ref 0.5–1.9)
Lactic Acid, Venous: 1.8 mmol/L (ref 0.5–1.9)

## 2022-07-18 MED ORDER — LACTATED RINGERS IV BOLUS (SEPSIS): 500.0000 mL | Freq: Once | INTRAVENOUS | Status: DC

## 2022-07-18 MED ORDER — LACTATED RINGERS IV SOLN: INTRAVENOUS | Status: DC

## 2022-07-18 MED ORDER — SODIUM CHLORIDE 0.9 % IV SOLN: 2.0000 g | Freq: Once | INTRAVENOUS | Status: DC

## 2022-07-18 MED ORDER — SODIUM CHLORIDE 0.9 % IV SOLN
2.0000 g | Freq: Two times a day (BID) | INTRAVENOUS | Status: DC
  Administered 2022-07-18 – 2022-07-20 (×4): 2 g via INTRAVENOUS
  Filled 2022-07-18 (×4): qty 12.5

## 2022-07-18 MED ORDER — LACTATED RINGERS IV BOLUS
1000.0000 mL | Freq: Once | INTRAVENOUS | Status: AC
  Administered 2022-07-18: 1000 mL via INTRAVENOUS

## 2022-07-18 MED ORDER — LACTATED RINGERS IV BOLUS (SEPSIS)
1000.0000 mL | Freq: Once | INTRAVENOUS | Status: AC
  Administered 2022-07-18: 1000 mL via INTRAVENOUS

## 2022-07-18 MED ORDER — LACTATED RINGERS IV BOLUS (SEPSIS)
1000.0000 mL | Freq: Once | INTRAVENOUS | Status: DC
  Administered 2022-07-18: 1000 mL via INTRAVENOUS

## 2022-07-18 NOTE — ED Triage Notes (Signed)
Patient here POV from Home.  Endorses N/V that began 4 Days ago. Seen and treated 3 Weeks ago for Similar Symptoms. Felt better upon Discharge but symptoms began to recur.   No Fevers or Diarrhea.   NAD Noted during Triage. A&Ox4. GCS 15. BIB Wheelchair.

## 2022-07-18 NOTE — ED Notes (Signed)
Kiana with cl called for transport

## 2022-07-18 NOTE — ED Provider Notes (Signed)
[facility] Provider Note   CSN: [id number] Arrival date & time: 07/18/22  1234     History  Chief Complaint  Patient presents with   Vomiting    Brandi Stephens is a 77 y.o. female with history of hypertension, hypothyroidism, hyperlipidemia, COPD, breast cancer s/p lumpectomy and radiation, CKD, chronic diastolic heart failure, chronic osteomyelitis of lumbar spine with chronic antibiotic use, who presents the emergency department complaining of nausea and vomiting for the past 4 days.  Patient states that she was admitted to the hospital about a month ago for similar symptoms, and was found at that time to have norovirus.  She said that she felt better upon discharge, but symptoms began to recur.  States she cannot keep anything down.  She also feels she might have a UTI.  HPI     Home Medications Prior to Admission medications   Medication Sig Start Date End Date Taking? Authorizing Provider  allopurinol (ZYLOPRIM) 300 MG tablet Take 300 mg by mouth daily.    [provider]  amitriptyline (ELAVIL) 100 MG tablet Take 1 tablet (100 mg total) by mouth at bedtime. 07/05/22   Raulkar, Clide Deutscher, MD  atorvastatin (LIPITOR) 80 MG tablet Take 1 tablet (80 mg total) by mouth daily. 06/10/22   Tysinger, Camelia Eng, PA-C  Biotin 3 MG TABS Take 3 mg by mouth daily.    [provider]  blood glucose meter kit and supplies KIT Dispense based on patient and insurance preference. Use up to four times daily as directed. 01/26/21   Hosie Poisson, MD  cefadroxil (DURICEF) 500 MG capsule Take 1 capsule (500 mg total) by mouth 2 (two) times daily. 07/14/22   [name], MD  Cholecalciferol (VITAMIN D3) 50 MCG (2000 UT) capsule Take 1 capsule (2,000 Units total) by mouth daily. 08/10/21   Tysinger, Camelia Eng, PA-C  colchicine 0.6 MG tablet Take 2 tablets at once then take 1 tablet an hour later then then 1 tablet twice daily x 3 days. 08/10/21    Tysinger, Camelia Eng, PA-C  Continuous Blood Gluc Sensor (FREESTYLE LIBRE 3 SENSOR) MISC Place 1 sensor on the skin every 14 days. Use to check glucose continuously 06/13/22   Tysinger, Camelia Eng, PA-C  diazepam (VALIUM) 10 MG tablet Take 1 tablet (10 mg total) by mouth daily as needed for anxiety. 07/05/22   Raulkar, Clide Deutscher, MD  DULoxetine (CYMBALTA) 60 MG capsule TAKE 1 CAPSULE(60 MG) BY MOUTH DAILY 06/10/22   Tysinger, Camelia Eng, PA-C  Fluticasone-Umeclidin-Vilant ([name] ELLIPTA) 100-62.5-25 MCG/ACT AEPB Inhale 1 puff into the lungs daily. 04/21/22   Tysinger, Camelia Eng, PA-C  HYDROcodone-acetaminophen (NORCO/VICODIN) 5-325 MG tablet Take 1 tablet by mouth every 4 (four) hours as needed for moderate pain. 08/27/21 08/27/22  Izora Ribas, MD  levothyroxine (SYNTHROID) 100 MCG tablet Take 1 tablet (100 mcg total) by mouth daily. 06/29/22 06/29/23  Bonnielee Haff, MD  Magnesium Gluconate 250 MG TABS Take 1 tablet by mouth daily.    [provider]  midodrine (PROAMATINE) 10 MG tablet Take 1 tablet (10 mg total) by mouth 3 (three) times daily with meals. 06/29/22   Bonnielee Haff, MD  OXYGEN Inhale 2 L into the lungs.    [provider]  pantoprazole (PROTONIX) 40 MG tablet Take 1 tablet (40 mg total) by mouth daily for 14 days. 06/29/22 07/13/22  Bonnielee Haff, MD  polyethylene glycol powder (GLYCOLAX/MIRALAX) 17 GM/SCOOP powder Take 17 g by mouth  daily as needed for moderate constipation.    [provider]  primidone (MYSOLINE) 50 MG tablet TAKE 1 TABLET BY MOUTH TWICE DAILY 11/01/21   Raulkar, Clide Deutscher, MD  Probiotic Product (PROBIOTIC DAILY PO) Take 1 tablet by mouth daily.    [provider]  Propylene Glycol (SYSTANE BALANCE OP) Place 1 drop into both eyes 4 (four) times daily as needed (dry eyes).    [provider]  rOPINIRole (REQUIP) 1 MG tablet TAKE 1 TABLET(1 MG) BY MOUTH AT BEDTIME 06/13/22   Tysinger, Camelia Eng, PA-C  Semaglutide, 1 MG/DOSE, 4 MG/3ML  SOPN Inject 1 mg as directed once a week. 06/10/22   Tysinger, Camelia Eng, PA-C  topiramate (TOPAMAX) 25 MG tablet Take 3 tablets (75 mg total) by mouth at bedtime. 07/05/22   Raulkar, Clide Deutscher, MD  triamcinolone ointment (KENALOG) 0.1 % Apply 1 Application topically 2 (two) times daily.    [provider]      Allergies    Ampicillin, Gabapentin, Penicillins, and Other    Review of Systems   Review of Systems  Gastrointestinal:  Positive for nausea and vomiting.  Genitourinary:  Positive for dysuria.  All other systems reviewed and are negative.   Physical Exam Updated Vital Signs BP 129/88   Pulse 94   Temp (!) 96.2 F (35.7 C) (Temporal)   Resp 18   Ht 5\' 8"  (1.727 m)   Wt 78.9 kg   SpO2 96%   BMI 26.45 kg/m  Physical Exam Vitals and nursing note reviewed.  Constitutional:      Appearance: Normal appearance.  HENT:     Head: Normocephalic and atraumatic.  Eyes:     Conjunctiva/sclera: Conjunctivae normal.  Cardiovascular:     Rate and Rhythm: Regular rhythm. Tachycardia present.  Pulmonary:     Effort: Pulmonary effort is normal. No respiratory distress.     Breath sounds: Normal breath sounds.  Abdominal:     General: There is no distension.     Palpations: Abdomen is soft.     Tenderness: There is no abdominal tenderness.  Musculoskeletal:     Right lower leg: No edema.     Left lower leg: No edema.  Skin:    General: Skin is warm and dry.  Neurological:     General: No focal deficit present.     Mental Status: She is alert.     ED Results / Procedures / Treatments   Labs (all labs ordered are listed, but only abnormal results are displayed) Labs Reviewed  LIPASE, BLOOD - Abnormal; Notable for the following components:      Result Value   Lipase 96 (*)    All other components within normal limits  COMPREHENSIVE METABOLIC PANEL - Abnormal; Notable for the following components:   Potassium 3.3 (*)    Glucose, Bld 119 (*)    Creatinine, Ser  1.11 (*)    GFR, Estimated 52 (*)    All other components within normal limits  URINALYSIS, ROUTINE W REFLEX MICROSCOPIC - Abnormal; Notable for the following components:   APPearance CLOUDY (*)    Specific Gravity, Urine 1.041 (*)    Hgb urine dipstick SMALL (*)    Protein, ur 30 (*)    Leukocytes,Ua LARGE (*)    Bacteria, UA MANY (*)    Non Squamous Epithelial 0-5 (*)    All other components within normal limits  LACTIC ACID, PLASMA - Abnormal; Notable for the following components:   Lactic Acid,  Venous 2.1 (*)    All other components within normal limits  CULTURE, BLOOD (ROUTINE X 2)  CULTURE, BLOOD (ROUTINE X 2)  URINE CULTURE  CBC  PROTIME-INR  LACTIC ACID, PLASMA    EKG None  Radiology DG Chest 2 View  Result Date: 07/18/2022 CLINICAL DATA:  Possible sepsis EXAM: CHEST - 2 VIEW COMPARISON:  06/20/2022 FINDINGS: Transverse diameter of heart is in upper limits of normal. There are no signs of pulmonary edema or focal pulmonary consolidation. There is no pleural effusion or pneumothorax. Surgical clips are seen in left chest wall. IMPRESSION: There are no signs of pulmonary edema or focal pulmonary consolidation. Electronically Signed   By: Elmer Picker M.D.   On: 07/18/2022 15:41    Procedures Procedures    Medications Ordered in ED Medications  lactated ringers infusion (has no administration in time range)  ceFEPIme (MAXIPIME) 2 g in sodium chloride 0.9 % 100 mL IVPB (2 g Intravenous New Bag/Given 07/18/22 1651)  lactated ringers bolus 1,000 mL (0 mLs Intravenous Stopped 07/18/22 1537)  lactated ringers bolus 1,000 mL (0 mLs Intravenous Stopped 07/18/22 1702)    ED Course/ Medical Decision Making/ A&P                             Medical Decision Making Amount and/or Complexity of Data Reviewed Labs: ordered. Radiology: ordered.  Risk Prescription drug management. Decision regarding hospitalization.   This patient is a 77 y.o. female  who presents to the  ED for concern for dehydration and vomiting.   Differential diagnoses prior to evaluation: The emergent differential diagnosis includes, but is not limited to,  ACS/MI, Boerhaave's, DKA, elevated ICP, Ischemic bowel, Sepsis, Drug-related (toxicity, THC hyperemesis, ETOH, withdrawal), Appendicitis, Bowel obstruction, Electrolyte abnormalities, Pancreatitis, Biliary colic, Gastroenteritis, Gastroparesis, Hepatitis, Migraine, Thyroid disease, Renal colic, GERD/PUD, UTI. This is not an exhaustive differential.   Past Medical History / Co-morbidities: hypertension, hypothyroidism, hyperlipidemia, COPD, breast cancer s/p lumpectomy and radiation, CKD, chronic diastolic heart failure, chronic osteomyelitis of lumbar spine with chronic antibiotic use  Additional history: Chart reviewed. Pertinent results include: Reviewed ER visit note and hospital discharge from 3/13.  Patient was admitted in the setting of septic shock thought to be related to dehydration/poor p.o. intake, tested positive for norovirus.  Was started on midodrine for persistent orthostatic hypotension.  Physical Exam: Physical exam performed. The pertinent findings include: Tachycardic in the 120s, regular rhythm.  Afebrile.  Abdomen soft and nontender.  Sounds clear.  No peripheral edema noted.  Lab Tests/Imaging studies: I personally interpreted labs/imaging and the pertinent results include: No leukocytosis, normal hemoglobin.  CMP with mild elevation in creatinine to 1.11, mild decrease in GFR.  Initial lactic acid 2.1.  Urinalysis with small hemoglobin, large leukocytes, over 50 WBCs, and many bacteria.  Urine culture and blood cultures pending.  Chest x-ray without acute abnormalities.  I agree with the radiologist interpretation.  Medications: I ordered medication including IV fluids and antibiotics.  I have reviewed the patients home medicines and have made adjustments as needed.  Consultations obtained: I consulted with  [facility] hospitalist Dr. [place] who will admit to [facility].   Disposition: After consideration of the diagnostic results and the patients response to treatment, I feel that patient would benefit from medical admission for lactic acidosis due to poor p.o. intake and vomiting, likely in the setting of UTI.  Final Clinical Impression(s) / ED Diagnoses Final diagnoses:  Lactic acidosis  Acute cystitis without hematuria    Rx / DC Orders ED Discharge Orders     None      Portions of this report may have been transcribed using voice recognition software. Every effort was made to ensure accuracy; however, inadvertent computerized transcription errors may be present.    Estill Cotta 07/18/22 1801    Davonna Belling, MD 07/18/22 [id number]

## 2022-07-18 NOTE — Plan of Care (Signed)
[place]: Psychologist, sport and exercise Requesting Physician/APP: Lahoma Rocker, PA  History: 49F w/ Hx DM2, HTN, chronic osteomyelitis lumbar spine with MSSA on suppressive antibiotics who presented to [place] with nausea/vomiting and found to have mild bump in creatinine and concern for sepsis secondary to UTI.  Started on cefepime.  Received aggressive IV fluid hydration.  Patient was tachycardic which improved with IV fluid hydration.  Plan of Care:  -- Accepted to med telemetry bed at [facility] or [facility]   [facility] will assume care on arrival to accepting facility. Until arrival, care as per EDP. However, TRH available 24/7 for questions and assistance.   Please page [place] and Consults [phone]) as soon as the patient arrives to the hospital.   Abraham Entwistle [place] DO

## 2022-07-18 NOTE — ED Notes (Signed)
Report given to the RN... 

## 2022-07-18 NOTE — Telephone Encounter (Signed)
Pt and her daughter called stating that she had the norovirus a month ago and she has it again since thursday. Pt is having some dizziness, vomiting and can't keep anything down. Pt is drinking gatorade to try to stay hydrated. Her feet are red. She fell 2 days ago. Her weight is 178 per her scale. Her bp is running around 121/63. Please advise [name] recently saw her for this

## 2022-07-18 NOTE — ED Notes (Signed)
Patient transported to X-ray 

## 2022-07-18 NOTE — H&P (Signed)
History and Physical    Brandi Stephens [id number] DOB: 03/26/1946 DOA: 07/18/2022  PCP: Carlena Hurl, PA-C  Patient coming from: [place] ED  Chief Complaint: Nausea and vomiting  HPI: Brandi Stephens is a 77 y.o. 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  Recently admitted 3/4-3/13 for vomiting and diarrhea secondary to norovirus infection.  She was also found to have orthostatic hypotension of unclear etiology and started on midodrine.  Patient returned to the ED today with complaints of nausea and vomiting x 4 days.  Afebrile.  Tachycardic on arrival to ED with heart rate in the 120s and blood pressure soft with systolic in the 0000000.  Labs showing no leukocytosis, potassium 3.3, creatinine 1.1 (baseline 0.6-0.8), normal LFTs, lipase 96, lactic acid 2.1> 1.8.  UA with large amount of leukocytes and microscopy showing 21-50 RBCs, >50 WBCs, and many bacteria.  Urine culture pending.  Blood cultures collected.  Chest x-ray showing no pulmonary edema or focal pulmonary consolidation. Patient received cefepime and 3 L LR boluses in the ED.  Blood pressure and heart rate subsequently improved.  Patient states she had norovirus infection a month ago and recovered but 4 days ago started having nausea and vomiting again.  No diarrhea.  She has not been able to eat or drink much at home.  Denies abdominal or flank pain.  Denies dysuria or urinary frequency/urgency.  She reports generalized weakness.  She denies history of gallstones.  She drinks 2 or 3 glasses of wine in a week, denies daily or heavy alcohol use.  Denies cough, shortness of breath, or chest pain.  ED Course: ***  Review of Systems:  Review of Systems  All other systems reviewed and are negative.   Past Medical History:  Diagnosis Date   AIN (anal intraepithelial neoplasia) anal canal    Anemia    with  pregnancy   Anxiety    Arthritis    knees, lower back, knees   Chronic constipation    Chronic diastolic CHF (congestive heart failure) 07/05/2019   CKD (chronic kidney disease)    Coarse tremors    Essential   COPD (chronic obstructive pulmonary disease)    2017 chest CT, history of   Degenerative lumbar spinal stenosis    Depression    Depression 07/05/2019   Drug dependency    Elevated PTHrP level 05/09/2019   EtOH use 07/05/2019   Pt denies heavy drinking however   GERD (gastroesophageal reflux disease)    pt denies   Gout    Grade I diastolic dysfunction 0000000   Noted ECHO   Hemorrhoids    History of adenomatous polyp of colon    History of basal cell carcinoma (BCC) excision 2015   nose   History of sepsis 05/14/2014   post lumbar surgery   History of shingles    x2   Hyperlipidemia    Hypertension    Hypothyroidism    Irregular heart beat    history of while undergoing radiation   Malignant neoplasm of upper-inner quadrant of left breast in female, estrogen receptor positive oncologist-  dr Jana Hakim--  per lov in epic no recurrence   dx 07-13-2015  Left breast invasive ductal carcinoma, Stage IA, Grade 1 (TXN0),  09-16-2015  s/p  left breast lumpectomy with sln bx's,  completed radiation 11-18-2015,  started antiestrogen therapy 12-14-2015   Neuropathy    Left foot  and back of left leg   Obesity (BMI 30-39.9) 07/05/2019   Personal history of radiation therapy    completed 11-18-2015  left breast   Pneumonia    PONV (postoperative nausea and vomiting)    Prediabetes    diet controlled, no med   Restless leg syndrome    Seasonal allergies    Seizure 05/2015   due to sepsis, only one time episode   Tremor    Wears partial dentures    lower     Past Surgical History:  Procedure Laterality Date   ABDOMINAL HYSTERECTOMY  1985   BASAL CELL CARCINOMA EXCISION  10/15   nose   BREAST BIOPSY Right 08/24/2015   BREAST BIOPSY Right 07/13/2015   BREAST  BIOPSY Left 07/13/2015   BREAST EXCISIONAL BIOPSY Left 1972   BREAST LUMPECTOMY Left    BREAST LUMPECTOMY WITH RADIOACTIVE SEED AND SENTINEL LYMPH NODE BIOPSY Left 09/16/2015   Procedure: BREAST LUMPECTOMY WITH RADIOACTIVE SEED AND SENTINEL LYMPH NODE BIOPSY;  Surgeon: Stark Klein, MD;  Location: [place];  Service: General;  Laterality: Left;   BREAST REDUCTION SURGERY Bilateral 1998   CATARACT EXTRACTION W/ INTRAOCULAR LENS IMPLANT Left 2016   [place]   hallo   COLONOSCOPY  01/2013   polyps   EYE SURGERY Left 2016   HAND SURGERY Left 02-19-2002   dr Fredna Dow  @[facility]    repair collateral ligament/ MPJ left thumb   HARDWARE REMOVAL N/A 07/24/2019   Procedure: REVISION OF HARDWARE Lumbar two - Sacral one. Extention of Fusion to Lumbar one;  Surgeon: [name], MD;  Location: [place];  Service: Neurosurgery;  Laterality: N/A;   HEMORRHOID SURGERY  2008   HIGH RESOLUTION ANOSCOPY N/A 02/28/2018   Procedure: HIGH RESOLUTION ANOSCOPY WITH BIOPSY;  Surgeon: Leighton Ruff, MD;  Location: [place];  Service: General;  Laterality: N/A;   KNEE ARTHROSCOPY Right 2002   LUMBAR SPINE SURGERY  03-20-2015   dr Kathyrn Sheriff   fusion L4-5, L5-S1   MOUTH SURGERY  07/14/2018   2 infected teeth removed   MULTIPLE TOOTH EXTRACTIONS     with bone grafting lower bottom right and left   REDUCTION MAMMAPLASTY Bilateral    RIGHT COLECTOMY  09-10-2003   dr Margot Chimes @[facility]    multiple colon polyps (per path tubular adenoma's, hyperplastic , benign appendix, benign two lymph nodes   ROTATOR CUFF REPAIR Left 04/2016   TONSILLECTOMY  1969   TUBAL LIGATION Bilateral 1982     reports that she quit smoking about 4 years ago. Her smoking use included cigarettes. She has a 17.50 pack-year smoking history. She has never used smokeless tobacco. She reports current alcohol use. She reports that she does not currently use drugs.  Allergies  Allergen Reactions    Ampicillin Hives and Other (See Comments)    Severe reaction in February 2017 1.5 month to have hives to go away   Gabapentin Swelling    UNSPECIFIED REACTION    Penicillins Hives and Other (See Comments)    Severe reaction in February 2017 1.5 month to have hives to go away Has patient had a PCN reaction causing immediate rash, facial/tongue/throat swelling, SOB or lightheadedness with hypotension: #  #  #  YES  #  #  #  Has patient had a PCN reaction causing severe rash involving mucus membranes or skin necrosis: No Has patient had a PCN reaction that required hospitalization No Has patient had a PCN reaction  occurring within the last 10 years: No    Other Itching    UNSPECIFIED Analgesics    Family History  Problem Relation Age of Onset   Atrial fibrillation Mother    Ulcers Father    Breast cancer Maternal Aunt    Lung cancer Maternal Uncle    Stomach cancer Maternal Grandmother    Lung cancer Maternal Aunt    Brain cancer Maternal Aunt    Prostate cancer Maternal Grandfather    Stroke Paternal Grandmother    Tuberculosis Paternal Grandfather    Colon cancer Neg Hx    Esophageal cancer Neg Hx    Rectal cancer Neg Hx     Prior to Admission medications   Medication Sig Start Date End Date Taking? Authorizing Provider  allopurinol (ZYLOPRIM) 300 MG tablet Take 300 mg by mouth daily.    [provider]  amitriptyline (ELAVIL) 100 MG tablet Take 1 tablet (100 mg total) by mouth at bedtime. 07/05/22   Raulkar, Clide Deutscher, MD  atorvastatin (LIPITOR) 80 MG tablet Take 1 tablet (80 mg total) by mouth daily. 06/10/22   Tysinger, Camelia Eng, PA-C  Biotin 3 MG TABS Take 3 mg by mouth daily.    [provider]  blood glucose meter kit and supplies KIT Dispense based on patient and insurance preference. Use up to four times daily as directed. 01/26/21   Hosie Poisson, MD  cefadroxil (DURICEF) 500 MG capsule Take 1 capsule (500 mg total) by mouth 2 (two) times daily. 07/14/22    [name], MD  Cholecalciferol (VITAMIN D3) 50 MCG (2000 UT) capsule Take 1 capsule (2,000 Units total) by mouth daily. 08/10/21   Tysinger, Camelia Eng, PA-C  colchicine 0.6 MG tablet Take 2 tablets at once then take 1 tablet an hour later then then 1 tablet twice daily x 3 days. 08/10/21   Tysinger, Camelia Eng, PA-C  Continuous Blood Gluc Sensor (FREESTYLE LIBRE 3 SENSOR) MISC Place 1 sensor on the skin every 14 days. Use to check glucose continuously 06/13/22   Tysinger, Camelia Eng, PA-C  diazepam (VALIUM) 10 MG tablet Take 1 tablet (10 mg total) by mouth daily as needed for anxiety. 07/05/22   Raulkar, Clide Deutscher, MD  DULoxetine (CYMBALTA) 60 MG capsule TAKE 1 CAPSULE(60 MG) BY MOUTH DAILY 06/10/22   Tysinger, Camelia Eng, PA-C  Fluticasone-Umeclidin-Vilant ([name] ELLIPTA) 100-62.5-25 MCG/ACT AEPB Inhale 1 puff into the lungs daily. 04/21/22   Tysinger, Camelia Eng, PA-C  HYDROcodone-acetaminophen (NORCO/VICODIN) 5-325 MG tablet Take 1 tablet by mouth every 4 (four) hours as needed for moderate pain. 08/27/21 08/27/22  Izora Ribas, MD  levothyroxine (SYNTHROID) 100 MCG tablet Take 1 tablet (100 mcg total) by mouth daily. 06/29/22 06/29/23  Bonnielee Haff, MD  Magnesium Gluconate 250 MG TABS Take 1 tablet by mouth daily.    [provider]  midodrine (PROAMATINE) 10 MG tablet Take 1 tablet (10 mg total) by mouth 3 (three) times daily with meals. 06/29/22   Bonnielee Haff, MD  OXYGEN Inhale 2 L into the lungs.    [provider]  pantoprazole (PROTONIX) 40 MG tablet Take 1 tablet (40 mg total) by mouth daily for 14 days. 06/29/22 07/13/22  Bonnielee Haff, MD  polyethylene glycol powder (GLYCOLAX/MIRALAX) 17 GM/SCOOP powder Take 17 g by mouth daily as needed for moderate constipation.    [provider]  primidone (MYSOLINE) 50 MG tablet TAKE 1 TABLET BY MOUTH TWICE DAILY 11/01/21   Raulkar, Clide Deutscher, MD  Probiotic Product (  PROBIOTIC DAILY PO) Take 1 tablet by mouth daily.    [provider]  Propylene Glycol (SYSTANE BALANCE OP) Place 1 drop into both eyes 4 (four) times daily as needed (dry eyes).    [provider]  rOPINIRole (REQUIP) 1 MG tablet TAKE 1 TABLET(1 MG) BY MOUTH AT BEDTIME 06/13/22   Tysinger, Camelia Eng, PA-C  Semaglutide, 1 MG/DOSE, 4 MG/3ML SOPN Inject 1 mg as directed once a week. 06/10/22   Tysinger, Camelia Eng, PA-C  topiramate (TOPAMAX) 25 MG tablet Take 3 tablets (75 mg total) by mouth at bedtime. 07/05/22   Raulkar, Clide Deutscher, MD  triamcinolone ointment (KENALOG) 0.1 % Apply 1 Application topically 2 (two) times daily.    [provider]    Physical Exam: Vitals:   07/18/22 1930 07/18/22 2000 07/18/22 2030 07/18/22 2242  BP: (!) 151/77 (!) 113/101 129/84 127/77  Pulse: 93 95 96 100  Resp: (!) 21 16 15    Temp:    98.4 F (36.9 C)  TempSrc:    Oral  SpO2: 99% 96% 93% 96%  Weight:      Height:        Physical Exam Vitals reviewed.  Constitutional:      General: She is not in acute distress.    Comments: Eating food  HENT:     Head: Normocephalic and atraumatic.  Eyes:     Extraocular Movements: Extraocular movements intact.  Cardiovascular:     Rate and Rhythm: Normal rate and regular rhythm.     Pulses: Normal pulses.  Pulmonary:     Effort: Pulmonary effort is normal. No respiratory distress.     Breath sounds: Normal breath sounds. No wheezing or rales.  Abdominal:     General: Bowel sounds are normal. There is no distension.     Palpations: Abdomen is soft.     Tenderness: There is no abdominal tenderness. There is no guarding.  Musculoskeletal:     Cervical back: Normal range of motion.     Right lower leg: No edema.     Left lower leg: No edema.  Skin:    General: Skin is warm and dry.  Neurological:     General: No focal deficit present.     Mental Status: She is alert and oriented to person, place, and time.     Labs on Admission: I have personally reviewed following labs and imaging  studies  CBC: Recent Labs  Lab 07/18/22 1254  WBC 6.8  HGB 14.3  HCT 43.5  MCV 94.4  PLT 123456   Basic Metabolic Panel: Recent Labs  Lab 07/18/22 1254  NA 144  K 3.3*  CL 105  CO2 26  GLUCOSE 119*  BUN 23  CREATININE 1.11*  CALCIUM 10.1   GFR: Estimated Creatinine Clearance: 48.3 mL/min (A) (by C-G formula based on SCr of 1.11 mg/dL (H)). Liver Function Tests: Recent Labs  Lab 07/18/22 1254  AST 19  ALT 13  ALKPHOS 77  BILITOT 0.7  PROT 6.5  ALBUMIN 3.9   Recent Labs  Lab 07/18/22 1254  LIPASE 96*   No results for input(s): "AMMONIA" in the last 168 hours. Coagulation Profile: Recent Labs  Lab 07/18/22 1600  INR 1.0   Cardiac Enzymes: No results for input(s): "CKTOTAL", "CKMB", "CKMBINDEX", "TROPONINI" in the last 168 hours. BNP (last 3 results) No results for input(s): "PROBNP" in the last 8760 hours. HbA1C: No results for input(s): "HGBA1C" in the last 72 hours. CBG: No results for  input(s): "GLUCAP" in the last 168 hours. Lipid Profile: No results for input(s): "CHOL", "HDL", "LDLCALC", "TRIG", "CHOLHDL", "LDLDIRECT" in the last 72 hours. Thyroid Function Tests: No results for input(s): "TSH", "T4TOTAL", "FREET4", "T3FREE", "THYROIDAB" in the last 72 hours. Anemia Panel: No results for input(s): "VITAMINB12", "FOLATE", "FERRITIN", "TIBC", "IRON", "RETICCTPCT" in the last 72 hours. Urine analysis:    Component Value Date/Time   COLORURINE YELLOW 07/18/2022 1254   APPEARANCEUR CLOUDY (A) 07/18/2022 1254   LABSPEC 1.041 (H) 07/18/2022 1254   LABSPEC 1.025 06/24/2019 1426   PHURINE 5.5 07/18/2022 1254   GLUCOSEU NEGATIVE 07/18/2022 1254   HGBUR SMALL (A) 07/18/2022 1254   BILIRUBINUR NEGATIVE 07/18/2022 1254   BILIRUBINUR moderate (A) 06/24/2019 1426   BILIRUBINUR n 11/25/2015 1712   KETONESUR NEGATIVE 07/18/2022 1254   PROTEINUR 30 (A) 07/18/2022 1254   UROBILINOGEN negative 11/25/2015 1712   NITRITE NEGATIVE 07/18/2022 1254    LEUKOCYTESUR LARGE (A) 07/18/2022 1254    Radiological Exams on Admission: DG Chest 2 View  Result Date: 07/18/2022 CLINICAL DATA:  Possible sepsis EXAM: CHEST - 2 VIEW COMPARISON:  06/20/2022 FINDINGS: Transverse diameter of heart is in upper limits of normal. There are no signs of pulmonary edema or focal pulmonary consolidation. There is no pleural effusion or pneumothorax. Surgical clips are seen in left chest wall. IMPRESSION: There are no signs of pulmonary edema or focal pulmonary consolidation. Electronically Signed   By: Elmer Picker M.D.   On: 07/18/2022 15:41    EKG: No EKG done in the ED.  EKG has been ordered now and currently pending.  Assessment and Plan  UTI UA with large amount of leukocytes and microscopy showing 21-50 RBCs, >50 WBCs, and many bacteria.  Kidney stone less likely as patient is not endorsing any abdominal or flank pain.  Tachycardia and mild lactic acidosis resolved after IV fluids.  Blood pressure currently stable.  No fever or leukocytosis.  No signs of sepsis at this time.  Penicillin allergy listed but patient received cefepime in the ED without any adverse reaction.  Continue cefepime.  Urine and blood cultures pending.  Nausea and vomiting Normal LFTs.  Lipase elevated at 96 but no abdominal pain or tenderness.  AKI Creatinine 1.1, baseline 0.6-0.8  Mild hypokalemia Potassium 3.3  Type 2 diabetes  Hypertension  Chronic osteomyelitis of lumbar spine with MSSA  Chronic HFpEF Recent echo done 06/27/2022 showing preserved EF and grade 1 diastolic dysfunction. Chest x-ray not showing pulmonary edema.  History of orthostatic hypotension  DVT prophylaxis: {Blank single:19197::"Lovenox","SQ Heparin","IV heparin gtt","Xarelto","Eliquis","Coumadin","SCDs","***"} Code Status: {Blank single:19197::"Full Code","DNR","DNR/DNI","Comfort Care","***"} Family Communication: ***  Consults called: ***  Level of care: {Blank  single:19197::"Med-Surg","Telemetry bed","Progressive Care Unit","Step Down Unit"} Admission status: *** Time Spent: 75+ minutes***  Shela Leff MD [facility]  If 7PM-7AM, please contact night-coverage [email or web address]  07/18/2022, 10:55 PM

## 2022-07-19 ENCOUNTER — Ambulatory Visit: Payer: Medicare HMO | Admitting: Nurse Practitioner

## 2022-07-19 DIAGNOSIS — E038 Other specified hypothyroidism: Secondary | ICD-10-CM

## 2022-07-19 DIAGNOSIS — N3001 Acute cystitis with hematuria: Secondary | ICD-10-CM

## 2022-07-19 DIAGNOSIS — M4626 Osteomyelitis of vertebra, lumbar region: Secondary | ICD-10-CM | POA: Diagnosis not present

## 2022-07-19 DIAGNOSIS — N390 Urinary tract infection, site not specified: Secondary | ICD-10-CM

## 2022-07-19 DIAGNOSIS — R112 Nausea with vomiting, unspecified: Secondary | ICD-10-CM | POA: Diagnosis not present

## 2022-07-19 DIAGNOSIS — E119 Type 2 diabetes mellitus without complications: Secondary | ICD-10-CM | POA: Diagnosis not present

## 2022-07-19 LAB — COMPREHENSIVE METABOLIC PANEL
ALT: 14 U/L (ref 0–44)
AST: 16 U/L (ref 15–41)
Albumin: 3.1 g/dL — ABNORMAL LOW (ref 3.5–5.0)
Alkaline Phosphatase: 69 U/L (ref 38–126)
Anion gap: 7 (ref 5–15)
BUN: 18 mg/dL (ref 8–23)
CO2: 26 mmol/L (ref 22–32)
Calcium: 9 mg/dL (ref 8.9–10.3)
Chloride: 110 mmol/L (ref 98–111)
Creatinine, Ser: 0.94 mg/dL (ref 0.44–1.00)
GFR, Estimated: >60 mL/min (ref >60)
Glucose, Bld: 83 mg/dL (ref 70–99)
Potassium: 3 mmol/L — ABNORMAL LOW (ref 3.5–5.1)
Sodium: 143 mmol/L (ref 135–145)
Total Bilirubin: 0.8 mg/dL (ref 0.3–1.2)
Total Protein: 5.2 g/dL — ABNORMAL LOW (ref 6.5–8.1)

## 2022-07-19 LAB — MAGNESIUM: Magnesium: 1.5 mg/dL — ABNORMAL LOW (ref 1.7–2.4)

## 2022-07-19 LAB — GLUCOSE, CAPILLARY
Glucose-Capillary: 95 mg/dL (ref 70–99)
Glucose-Capillary: 86 mg/dL (ref 70–99)
Glucose-Capillary: 75 mg/dL (ref 70–99)
Glucose-Capillary: 83 mg/dL (ref 70–99)

## 2022-07-19 LAB — CBC
HCT: 40.1 % (ref 36.0–46.0)
Hemoglobin: 12.6 g/dL (ref 12.0–15.0)
MCH: 30.4 pg (ref 26.0–34.0)
MCHC: 31.4 g/dL (ref 30.0–36.0)
MCV: 96.9 fL (ref 80.0–100.0)
Platelets: 236 10*3/uL (ref 150–400)
RBC: 4.14 MIL/uL (ref 3.87–5.11)
RDW: 15 % (ref 11.5–15.5)
WBC: 7.4 10*3/uL (ref 4.0–10.5)
nRBC: 0 % (ref 0.0–0.2)

## 2022-07-19 LAB — LIPASE, BLOOD: Lipase: 60 U/L — ABNORMAL HIGH (ref 11–51)

## 2022-07-19 LAB — BRAIN NATRIURETIC PEPTIDE: B Natriuretic Peptide: 122.2 pg/mL — ABNORMAL HIGH (ref 0.0–100.0)

## 2022-07-19 LAB — URINE CULTURE: Culture: NO GROWTH

## 2022-07-19 MED ORDER — LEVOTHYROXINE SODIUM 100 MCG PO TABS
100.0000 ug | ORAL_TABLET | Freq: Every day | ORAL | Status: DC
  Administered 2022-07-20 – 2022-07-24 (×5): 100 ug via ORAL
  Filled 2022-07-19 (×4): qty 1
  Filled 2022-07-19: qty 2

## 2022-07-19 MED ORDER — HYDROCODONE-ACETAMINOPHEN 5-325 MG PO TABS
1.0000 | ORAL_TABLET | ORAL | Status: DC | PRN
  Administered 2022-07-19 – 2022-07-23 (×6): 1 via ORAL
  Filled 2022-07-19 (×6): qty 1

## 2022-07-19 MED ORDER — ACETAMINOPHEN 325 MG PO TABS
650.0000 mg | ORAL_TABLET | Freq: Four times a day (QID) | ORAL | Status: DC | PRN
  Administered 2022-07-19 – 2022-07-24 (×5): 650 mg via ORAL
  Filled 2022-07-19 (×5): qty 2

## 2022-07-19 MED ORDER — UMECLIDINIUM BROMIDE 62.5 MCG/ACT IN AEPB
1.0000 | INHALATION_SPRAY | Freq: Every day | RESPIRATORY_TRACT | Status: DC
  Administered 2022-07-20 – 2022-07-24 (×5): 1 via RESPIRATORY_TRACT
  Filled 2022-07-19: qty 7

## 2022-07-19 MED ORDER — POTASSIUM CHLORIDE CRYS ER 20 MEQ PO TBCR
40.0000 meq | EXTENDED_RELEASE_TABLET | ORAL | Status: AC
  Administered 2022-07-19 (×2): 40 meq via ORAL
  Filled 2022-07-19 (×2): qty 2

## 2022-07-19 MED ORDER — DULOXETINE HCL 60 MG PO CPEP
60.0000 mg | ORAL_CAPSULE | Freq: Every day | ORAL | Status: DC
  Administered 2022-07-20 – 2022-07-24 (×5): 60 mg via ORAL
  Filled 2022-07-19 (×5): qty 1

## 2022-07-19 MED ORDER — MAGNESIUM SULFATE 2 GM/50ML IV SOLN
2.0000 g | Freq: Once | INTRAVENOUS | Status: AC
  Administered 2022-07-19: 2 g via INTRAVENOUS
  Filled 2022-07-19: qty 50

## 2022-07-19 MED ORDER — INSULIN ASPART 100 UNIT/ML IJ SOLN: 0.0000 [IU] | Freq: Every day | INTRAMUSCULAR | Status: DC

## 2022-07-19 MED ORDER — INSULIN ASPART 100 UNIT/ML IJ SOLN: 0.0000 [IU] | Freq: Three times a day (TID) | INTRAMUSCULAR | Status: DC

## 2022-07-19 MED ORDER — ALLOPURINOL 300 MG PO TABS
300.0000 mg | ORAL_TABLET | Freq: Every day | ORAL | Status: DC
  Administered 2022-07-20 – 2022-07-24 (×5): 300 mg via ORAL
  Filled 2022-07-19 (×5): qty 1

## 2022-07-19 MED ORDER — MIDODRINE HCL 5 MG PO TABS
10.0000 mg | ORAL_TABLET | Freq: Three times a day (TID) | ORAL | Status: DC
  Filled 2022-07-19: qty 2

## 2022-07-19 MED ORDER — FLUTICASONE FUROATE-VILANTEROL 100-25 MCG/ACT IN AEPB
1.0000 | INHALATION_SPRAY | Freq: Every day | RESPIRATORY_TRACT | Status: DC
  Administered 2022-07-20 – 2022-07-24 (×5): 1 via RESPIRATORY_TRACT
  Filled 2022-07-19: qty 28

## 2022-07-19 MED ORDER — DIAZEPAM 5 MG PO TABS
10.0000 mg | ORAL_TABLET | Freq: Every day | ORAL | Status: DC | PRN
  Administered 2022-07-21 – 2022-07-24 (×3): 10 mg via ORAL
  Filled 2022-07-19 (×4): qty 2

## 2022-07-19 MED ORDER — ACETAMINOPHEN 650 MG RE SUPP: 650.0000 mg | Freq: Four times a day (QID) | RECTAL | Status: DC | PRN

## 2022-07-19 MED ORDER — AMITRIPTYLINE HCL 25 MG PO TABS: 25.0000 mg | ORAL_TABLET | Freq: Every day | ORAL | Status: DC

## 2022-07-19 MED ORDER — LACTATED RINGERS IV SOLN: INTRAVENOUS | Status: AC

## 2022-07-19 MED ORDER — ENOXAPARIN SODIUM 40 MG/0.4ML IJ SOSY
40.0000 mg | PREFILLED_SYRINGE | INTRAMUSCULAR | Status: DC
  Administered 2022-07-19 – 2022-07-22 (×4): 40 mg via SUBCUTANEOUS
  Filled 2022-07-19 (×4): qty 0.4

## 2022-07-19 MED ORDER — AMITRIPTYLINE HCL 25 MG PO TABS
100.0000 mg | ORAL_TABLET | Freq: Every day | ORAL | Status: DC
  Administered 2022-07-19 – 2022-07-23 (×5): 100 mg via ORAL
  Filled 2022-07-19 (×6): qty 4

## 2022-07-19 MED ORDER — OXYCODONE HCL 5 MG PO TABS
5.0000 mg | ORAL_TABLET | Freq: Four times a day (QID) | ORAL | Status: DC | PRN
  Administered 2022-07-19: 5 mg via ORAL
  Filled 2022-07-19: qty 1

## 2022-07-19 MED ORDER — OXYCODONE HCL 5 MG PO TABS: 5.0000 mg | ORAL_TABLET | ORAL | Status: DC | PRN

## 2022-07-19 MED ORDER — AMITRIPTYLINE HCL 25 MG PO TABS
100.0000 mg | ORAL_TABLET | Freq: Once | ORAL | Status: AC
  Administered 2022-07-19: 100 mg via ORAL
  Filled 2022-07-19: qty 4

## 2022-07-19 MED ORDER — POTASSIUM CHLORIDE CRYS ER 20 MEQ PO TBCR
40.0000 meq | EXTENDED_RELEASE_TABLET | Freq: Once | ORAL | Status: AC
  Administered 2022-07-19: 40 meq via ORAL
  Filled 2022-07-19: qty 2

## 2022-07-19 NOTE — Progress Notes (Signed)
Pharmacy Antibiotic Note  Brandi Stephens is a 77 y.o. female admitted on 07/18/2022 with history of hypertension, hypothyroidism, hyperlipidemia, COPD, breast cancer s/p lumpectomy and radiation, CKD, chronic diastolic heart failure, chronic osteomyelitis of lumbar spine with chronic antibiotic use, who presents the emergency department complaining of nausea and vomiting for the past 4 days. .  Pharmacy has been consulted for cefepime dosing for UTI  Plan: Cefepime 2gm IV q12h Follow renal function and clinical course  Height: 5\' 7"  (170.2 cm) Weight: 83.6 kg (184 lb 4.9 oz) IBW/kg (Calculated) : 61.6  Temp (24hrs), Avg:97.6 F (36.4 C), Min:96.2 F (35.7 C), Max:98.4 F (36.9 C)  Recent Labs  Lab 07/18/22 1254 07/18/22 1505 07/18/22 1652  WBC 6.8  --   --   CREATININE 1.11*  --   --   LATICACIDVEN  --  2.1* 1.8    Estimated Creatinine Clearance: 48.7 mL/min (A) (by C-G formula based on SCr of 1.11 mg/dL (H)).    Allergies  Allergen Reactions   Ampicillin Hives and Other (See Comments)    Severe reaction in February 2017 1.5 month to have hives to go away   Gabapentin Swelling    UNSPECIFIED REACTION    Penicillins Hives and Other (See Comments)    Severe reaction in February 2017 1.5 month to have hives to go away Has patient had a PCN reaction causing immediate rash, facial/tongue/throat swelling, SOB or lightheadedness with hypotension: #  #  #  YES  #  #  #  Has patient had a PCN reaction causing severe rash involving mucus membranes or skin necrosis: No Has patient had a PCN reaction that required hospitalization No Has patient had a PCN reaction occurring within the last 10 years: No    Other Itching    UNSPECIFIED Analgesics     Thank you for allowing pharmacy to be a part of this patient's care.  Dolly Rias RPh 07/19/2022, 12:27 AM

## 2022-07-19 NOTE — Plan of Care (Signed)
  Problem: Education: Goal: Knowledge of General Education information will improve Description: Including pain rating scale, medication(s)/side effects and non-pharmacologic comfort measures 07/19/2022 0749 by Jerene Pitch, RN Outcome: Not Progressing 07/19/2022 0749 by Jerene Pitch, RN Outcome: Progressing   Problem: Health Behavior/Discharge Planning: Goal: Ability to manage health-related needs will improve 07/19/2022 0749 by Jerene Pitch, RN Outcome: Not Progressing 07/19/2022 0749 by Jerene Pitch, RN Outcome: Progressing

## 2022-07-19 NOTE — Progress Notes (Signed)
Pharmacy Antibiotic Note  Brandi Stephens is a 77 y.o. female admitted on 07/18/2022 with UTI.  Pharmacy has been consulted for Cefepime dosing.  ID: UTI per UA. -Chronic osteomyelitis of lumbar spine with MSSA: followed by infectious disease and on suppressive antibiotic (cefadroxil),  - Afebrile, WBC WNL, Scr <1  Cefepime 4/1 x 7d (stop date in)  4/1: BC x 2 4/1: UCx:  Plan: Cefepime 2gm q12h.Dose ok for renal function Pharmacy will sign off. Please reconsult for further dosing assitance.   Height: 5\' 7"  (170.2 cm) Weight: 83.6 kg (184 lb 4.9 oz) IBW/kg (Calculated) : 61.6  Temp (24hrs), Avg:97.8 F (36.6 C), Min:96.2 F (35.7 C), Max:98.4 F (36.9 C)  Recent Labs  Lab 07/18/22 1254 07/18/22 1505 07/18/22 1652 07/19/22 0428  WBC 6.8  --   --  7.4  CREATININE 1.11*  --   --  0.94  LATICACIDVEN  --  2.1* 1.8  --     Estimated Creatinine Clearance: 57.5 mL/min (by C-G formula based on SCr of 0.94 mg/dL).    Allergies  Allergen Reactions   Ampicillin Hives and Other (See Comments)    Severe reaction in February 2017 1.5 month to have hives to go away   Gabapentin Swelling    UNSPECIFIED REACTION    Penicillins Hives and Other (See Comments)    Severe reaction in February 2017 1.5 month to have hives to go away Has patient had a PCN reaction causing immediate rash, facial/tongue/throat swelling, SOB or lightheadedness with hypotension: #  #  #  YES  #  #  #  Has patient had a PCN reaction causing severe rash involving mucus membranes or skin necrosis: No Has patient had a PCN reaction that required hospitalization No Has patient had a PCN reaction occurring within the last 10 years: No    Other Itching    UNSPECIFIED Analgesics   Brandi Stephens S. Brandi Stephens, PharmD, BCPS Clinical Staff Pharmacist [email or web address]  Brandi Stephens 07/19/2022 8:48 AM

## 2022-07-19 NOTE — Progress Notes (Signed)
  Progress Note   Patient: Brandi Stephens [id number] DOB: 04/08/1946 DOA: 07/18/2022     1 DOS: the patient was seen and examined on 07/19/2022   Brief hospital course: 77 year old woman presented with vomiting.  Admitted for UTI.  Assessment and Plan: UTI Urinalysis positive but urine culture unrevealing.   Continue empiric treatment.  Clinically improved can probably change to oral therapy tomorrow.     Nausea and vomiting Etiology unclear.  Appears to be resolving.  Vomiting resolved.  No oral intake poor.  Lipase trivial elevation without abdominal pain.  Clinically insignificant. Continue current diet.  AKI considered on admission but baseline appears to be around 0.8.  Therefore AKI ruled out at this time.   Hypokalemia Hypomagnesemia Replete.   Non-insulin-dependent type 2 diabetes A1c 5.8 on 04/07/2022.  Sensitive sliding scale insulin ordered. CBG stable.   Chronic osteomyelitis of lumbar spine with MSSA Followed by infectious disease and on suppressive antibiotic (cefadroxil).  Can resume on discharge.   Chronic HFpEF Recent echo done 06/27/2022 showing preserved EF and grade 1 diastolic dysfunction. No signs of volume overload.  BNP trivial elevation.   Hypothyroidism Levothyroxine.     Subjective:  Vomiting resolved No abdominal pain Poor appetite  Physical Exam: Vitals:   07/19/22 0000 07/19/22 0227 07/19/22 0627 07/19/22 1120  BP:  133/75 132/84 121/77  Pulse:  92 91 93  Resp:    18  Temp:  98.2 F (36.8 C) 98 F (36.7 C) 97.7 F (36.5 C)  TempSrc:  Oral Oral Oral  SpO2:  97%  97%  Weight: 83.6 kg     Height: 5\' 7"  (1.702 m)      Physical Exam Vitals reviewed.  Constitutional:      General: She is not in acute distress.    Appearance: She is not ill-appearing or toxic-appearing.  Cardiovascular:     Rate and Rhythm: Normal rate and regular rhythm.     Heart sounds: No murmur heard. Pulmonary:     Effort: Pulmonary effort is normal. No  respiratory distress.     Breath sounds: No wheezing or rales.  Neurological:     Mental Status: She is alert.  Psychiatric:        Behavior: Behavior normal.     Data Reviewed: CMP noted CBC WNL Urine culture no growth  Family Communication: daughter at bedside  Disposition: Status is: Inpatient Remains inpatient appropriate because: n/v  Planned Discharge Destination: Home    Time spent: 35 minutes  Author: Murray Hodgkins, MD 07/19/2022 7:29 PM  For on call review [email or web address].

## 2022-07-19 NOTE — Hospital Course (Addendum)
77 year old woman presented with vomiting.  Admitted for UTI.

## 2022-07-19 NOTE — Telephone Encounter (Signed)
Pt is now in hospital

## 2022-07-19 NOTE — Progress Notes (Signed)
Pt stated she was itching & wanted medication. Notified provider.

## 2022-07-19 NOTE — TOC Progression Note (Signed)
Transition of Care [facility]) - Progression Note    Patient Details  Name: Brandi Stephens MRN: [id number] Date of Birth: [birth year]  [facility] CM/SW Contact  Purcell Mouton, RN Phone Number: 07/19/2022, 1:27 PM  Clinical Narrative:    Spoke with pt and daughter at bedside.  Pt was active with  [facility], pt would like to continue at discharge. Will need HH orders for RN & PT.   Expected Discharge Plan: [place] Barriers to Discharge: No Barriers Identified  Expected Discharge Plan and Services       Living arrangements for the past 2 months: Single Family Home                           HH Arranged: RN, PT [facility] Agency: [place] Date [place]: 07/19/22 Time [place]: 1326 Representative spoke with at [place]: [place] Determinants of Health ([place]) Interventions SDOH Screenings   Food Insecurity: No Food Insecurity (07/18/2022)  Housing: Low Risk  (07/18/2022)  Transportation Needs: No Transportation Needs (07/18/2022)  Utilities: Not At Risk (07/18/2022)  Depression (PHQ2-9): Low Risk  (07/14/2022)  Financial Resource Strain: Low Risk  (03/18/2022)  Physical Activity: Inactive (03/18/2022)  Stress: No Stress Concern Present (03/18/2022)  Tobacco Use: Medium Risk (07/18/2022)    Readmission Risk Interventions    06/23/2022   10:55 AM  Readmission Risk Prevention Plan  Transportation Screening Complete  PCP or Specialist Appt within 3-5 Days Complete  HRI or [place] Complete  Social Work Consult for [place] Planning/Counseling Complete  Palliative Care Screening Not Applicable

## 2022-07-20 DIAGNOSIS — R112 Nausea with vomiting, unspecified: Secondary | ICD-10-CM | POA: Diagnosis not present

## 2022-07-20 DIAGNOSIS — N3001 Acute cystitis with hematuria: Secondary | ICD-10-CM | POA: Diagnosis not present

## 2022-07-20 LAB — GLUCOSE, CAPILLARY
Glucose-Capillary: 77 mg/dL (ref 70–99)
Glucose-Capillary: 98 mg/dL (ref 70–99)
Glucose-Capillary: 94 mg/dL (ref 70–99)
Glucose-Capillary: 90 mg/dL (ref 70–99)

## 2022-07-20 LAB — BASIC METABOLIC PANEL
Anion gap: 8 (ref 5–15)
BUN: 12 mg/dL (ref 8–23)
CO2: 24 mmol/L (ref 22–32)
Calcium: 9.3 mg/dL (ref 8.9–10.3)
Chloride: 110 mmol/L (ref 98–111)
Creatinine, Ser: 0.89 mg/dL (ref 0.44–1.00)
GFR, Estimated: >60 mL/min (ref >60)
Glucose, Bld: 83 mg/dL (ref 70–99)
Potassium: 3.9 mmol/L (ref 3.5–5.1)
Sodium: 142 mmol/L (ref 135–145)

## 2022-07-20 LAB — TSH: TSH: 3.158 u[IU]/mL (ref 0.350–4.500)

## 2022-07-20 LAB — HIV ANTIBODY (ROUTINE TESTING W REFLEX): HIV Screen 4th Generation wRfx: NONREACTIVE

## 2022-07-20 LAB — VITAMIN B12: Vitamin B-12: 823 pg/mL (ref 180–914)

## 2022-07-20 LAB — MAGNESIUM: Magnesium: 1.8 mg/dL (ref 1.7–2.4)

## 2022-07-20 LAB — RPR: RPR Ser Ql: NONREACTIVE

## 2022-07-20 MED ORDER — CAMPHOR-MENTHOL 0.5-0.5 % EX LOTN
TOPICAL_LOTION | CUTANEOUS | Status: DC | PRN
  Administered 2022-07-20: 1 via TOPICAL
  Filled 2022-07-20: qty 222

## 2022-07-20 MED ORDER — POTASSIUM CHLORIDE CRYS ER 20 MEQ PO TBCR
20.0000 meq | EXTENDED_RELEASE_TABLET | Freq: Two times a day (BID) | ORAL | Status: AC
  Administered 2022-07-20 (×2): 20 meq via ORAL
  Filled 2022-07-20 (×2): qty 1

## 2022-07-20 MED ORDER — ONDANSETRON HCL 4 MG/2ML IJ SOLN
4.0000 mg | Freq: Four times a day (QID) | INTRAMUSCULAR | Status: DC | PRN
  Administered 2022-07-20: 4 mg via INTRAVENOUS
  Filled 2022-07-20: qty 2

## 2022-07-20 MED ORDER — PANTOPRAZOLE SODIUM 40 MG PO TBEC
40.0000 mg | DELAYED_RELEASE_TABLET | Freq: Two times a day (BID) | ORAL | Status: DC
  Administered 2022-07-20 – 2022-07-24 (×8): 40 mg via ORAL
  Filled 2022-07-20 (×8): qty 1

## 2022-07-20 MED ORDER — MAGNESIUM OXIDE -MG SUPPLEMENT 400 (240 MG) MG PO TABS
400.0000 mg | ORAL_TABLET | Freq: Two times a day (BID) | ORAL | Status: AC
  Administered 2022-07-20 (×2): 400 mg via ORAL
  Filled 2022-07-20 (×2): qty 1

## 2022-07-20 MED ORDER — MIDODRINE HCL 5 MG PO TABS
5.0000 mg | ORAL_TABLET | Freq: Three times a day (TID) | ORAL | Status: DC
  Administered 2022-07-20 – 2022-07-23 (×9): 5 mg via ORAL
  Filled 2022-07-20 (×9): qty 1

## 2022-07-20 MED ORDER — CEFDINIR 300 MG PO CAPS
300.0000 mg | ORAL_CAPSULE | Freq: Two times a day (BID) | ORAL | Status: DC
  Administered 2022-07-20 (×2): 300 mg via ORAL
  Filled 2022-07-20 (×3): qty 1

## 2022-07-20 NOTE — Progress Notes (Signed)
Mobility Specialist - Progress Note   07/20/22 1401  Mobility  Activity Ambulated with assistance in hallway  Level of Assistance Modified independent, requires aide device or extra time  Assistive Device Front wheel walker  Distance Ambulated (ft) 350 ft  Activity Response Tolerated well  Mobility Referral Yes  $Mobility charge 1 Mobility   Pt received in bed and agreeable to mobility. No complaints during session. Pt to bed after session with all needs met.     [facility]

## 2022-07-20 NOTE — Progress Notes (Signed)
[facility] PROGRESS NOTE    Progress Note  Brandi Stephens  [id number] DOB: [birth year] DOA: 07/18/2022 PCP: Carlena Hurl, PA-C     Brief Narrative:   Brandi Stephens is an 77 y.o. female history of diabetes mellitus comes in with vomiting admitted for UTI  Assessment/Plan:   UTI (urinary tract infection) Urine culture was unrevealing. Treated empirically antibiotics, transition to oral Omnicef.  Intractable nausea and vomiting of unclear etiology: Continues to have nausea and vomiting, she is tolerating some of her diet but continues to throw up threw up last night twice. Started on Protonix p.o. twice daily, check a TSH B12 RPR. If no improvement will need to consult GI.  Chronic kidney disease stage I: at baseline.  Hypokalemia/hypomagnesemia: Repleted orally now improved, currently potassium greater than 4 magnesium greater than 2.  Non-insulin-dependent diabetes mellitus type 2: With A1c of 5.8 CBG stable continue sliding scale.  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  Can resume at discharge.  Chronic diastolic heart failure: Appears euvolemic.  Hypothyroidism: Continue Synthroid.   DVT prophylaxis: lovenox Family Communication:none Status is: Inpatient Remains inpatient appropriate because: Acute UTI    Code Status:     Code Status Orders  (From admission, onward)           Start     Ordered   07/19/22 0006  Full code  Continuous       Question:  By:  Answer:  Consent: discussion documented in EHR   07/19/22 0006           Code Status History     Date Active Date Inactive Code Status Order ID Comments User Context   06/20/2022 2220 06/29/2022 2136 Full Code [id number]  Etta Quill, DO Inpatient   01/18/2021 2332 01/26/2021 2020 Full Code [id number]  Shela Leff, MD ED   08/02/2019 1454 08/10/2019 1717 Full Code  [id number]  Cathlyn Parsons, PA-C Inpatient   08/02/2019 1454 08/02/2019 1454 Full Code [id number]  Cathlyn Parsons, PA-C Inpatient   07/11/2019 1805 08/02/2019 1426 Full Code [id number]  Traci Sermon, PA-C Inpatient   07/11/2019 1311 07/11/2019 1656 Full Code [id number]  Traci Sermon, PA-C Inpatient   06/28/2019 1614 07/11/2019 1310 Full Code [id number]  Cathlyn Parsons, PA-C Inpatient   06/28/2019 1613 06/28/2019 1614 Full Code [id number]  Cathlyn Parsons, PA-C Inpatient   06/25/2019 0637 06/28/2019 1612 Full Code [id number]  Vianne Bulls, MD Inpatient   05/30/2019 1926 05/31/2019 1705 Full Code [id number]  Traci Sermon, PA-C Inpatient   03/01/2019 2349 03/04/2019 1658 Full Code [id number]  Cristescu, Linard Millers, MD ED   06/29/2018 1820 06/30/2018 1511 Full Code [id number]  Elpidio Eric Inpatient   05/15/2015 1839 05/21/2015 1843 Full Code [id number]  Rosemarie Ax, MD Inpatient   03/20/2015 1959 03/25/2015 2204 Full Code [id number]  [name], MD Inpatient         IV Access:   Peripheral IV   Procedures and diagnostic studies:   DG Chest 2 View  Result Date: 07/18/2022 CLINICAL DATA:  Possible sepsis EXAM: CHEST - 2 VIEW COMPARISON:  06/20/2022 FINDINGS: Transverse diameter of heart is in upper limits of normal. There are no signs of pulmonary edema or focal pulmonary consolidation. There is no pleural effusion or pneumothorax. Surgical clips are seen in left chest wall. IMPRESSION: There are no signs of pulmonary edema or focal  pulmonary consolidation. Electronically Signed   By: Elmer Picker M.D.   On: 07/18/2022 15:41     Medical Consultants:   None.   Subjective:    Brandi Stephens relates she threw up twice last night.  Objective:    Vitals:   07/19/22 1120 07/19/22 1954 07/19/22 2000 07/20/22 0430  BP: 121/77 (!) 139/93  118/82  Pulse: 93 89  96  Resp: 18     Temp: 97.7 F (36.5 C) 98 F (36.7 C)  97.7 F (36.5 C)  TempSrc:  Oral Oral  Oral  SpO2: 97% (!) 87% 95% 90%  Weight:      Height:       SpO2: 90 %   Intake/Output Summary (Last 24 hours) at 07/20/2022 0826 Last data filed at 07/20/2022 0034 Gross per 24 hour  Intake 940.06 ml  Output --  Net 940.06 ml   Filed Weights   07/18/22 1247 07/19/22 0000  Weight: 78.9 kg 83.6 kg    Exam: General exam: In no acute distress. Respiratory system: Good air movement and clear to auscultation. Cardiovascular system: S1 & S2 heard, RRR. No JVD. Gastrointestinal system: Abdomen is nondistended, soft and nontender.  Extremities: No pedal edema. Skin: No rashes, lesions or ulcers Psychiatry: Judgement and insight appear normal. Mood & affect appropriate.    Data Reviewed:    Labs: Basic Metabolic Panel: Recent Labs  Lab 07/18/22 1254 07/19/22 0428 07/20/22 0446  NA 144 143 142  K 3.3* 3.0* 3.9  CL 105 110 110  CO2 26 26 24   GLUCOSE 119* 83 83  BUN 23 18 12   CREATININE 1.11* 0.94 0.89  CALCIUM 10.1 9.0 9.3  MG  --  1.5* 1.8   GFR Estimated Creatinine Clearance: 60.7 mL/min (by C-G formula based on SCr of 0.89 mg/dL). Liver Function Tests: Recent Labs  Lab 07/18/22 1254 07/19/22 0428  AST 19 16  ALT 13 14  ALKPHOS 77 69  BILITOT 0.7 0.8  PROT 6.5 5.2*  ALBUMIN 3.9 3.1*   Recent Labs  Lab 07/18/22 1254 07/19/22 0428  LIPASE 96* 60*   No results for input(s): "AMMONIA" in the last 168 hours. Coagulation profile Recent Labs  Lab 07/18/22 1600  INR 1.0   COVID-19 Labs  No results for input(s): "DDIMER", "FERRITIN", "LDH", "CRP" in the last 72 hours.  Lab Results  Component Value Date   SARSCOV2NAA NEGATIVE 06/20/2022   [place] NEGATIVE 01/18/2021   [place] NEGATIVE 05/11/2020   [place] NEGATIVE 06/24/2019    CBC: Recent Labs  Lab 07/18/22 1254 07/19/22 0428  WBC 6.8 7.4  HGB 14.3 12.6  HCT 43.5 40.1  MCV 94.4 96.9  PLT 280 236   Cardiac Enzymes: No results for input(s): "CKTOTAL", "CKMB",  "CKMBINDEX", "TROPONINI" in the last 168 hours. BNP (last 3 results) No results for input(s): "PROBNP" in the last 8760 hours. CBG: Recent Labs  Lab 07/19/22 0754 07/19/22 1115 07/19/22 1708 07/19/22 1952 07/20/22 0812  GLUCAP 95 86 75 83 77   D-Dimer: No results for input(s): "DDIMER" in the last 72 hours. Hgb A1c: No results for input(s): "HGBA1C" in the last 72 hours. Lipid Profile: No results for input(s): "CHOL", "HDL", "LDLCALC", "TRIG", "CHOLHDL", "LDLDIRECT" in the last 72 hours. Thyroid function studies: No results for input(s): "TSH", "T4TOTAL", "T3FREE", "THYROIDAB" in the last 72 hours.  Invalid input(s): "FREET3" Anemia work up: No results for input(s): "VITAMINB12", "FOLATE", "FERRITIN", "TIBC", "IRON", "RETICCTPCT" in the last 72 hours. Sepsis Labs: Recent Labs  Lab 07/18/22 1254 07/18/22 1505 07/18/22 1652 07/19/22 0428  WBC 6.8  --   --  7.4  LATICACIDVEN  --  2.1* 1.8  --    Microbiology Recent Results (from the past 240 hour(s))  Urine Culture (for pregnant, neutropenic or urologic patients or patients with an indwelling urinary catheter)     Status: None   Collection Time: 07/18/22 12:54 PM   Specimen: Urine, Clean Catch  Result Value Ref Range Status   Specimen Description   Final    URINE, CLEAN CATCH Performed at [facility], [address], [place], [place] [address]    Special Requests   Final    NONE Performed at [facility], [address], [place], [place] [address]    Culture   Final    NO GROWTH Performed at [facility], [place] [address], [place], [place] [address]    Report Status 07/19/2022 FINAL  Final  Culture, blood (Routine x 2)     Status: None (Preliminary result)   Collection Time: 07/18/22  3:05 PM   Specimen: BLOOD RIGHT ARM  Result Value Ref Range Status   Specimen Description   Final    BLOOD RIGHT ARM Performed at [facility], [place] [address], [place],  [place] [address]    Special Requests   Final    BOTTLES DRAWN AEROBIC AND ANAEROBIC Blood Culture adequate volume Performed at [facility], [address], [place], [place] [address]    Culture   Final    NO GROWTH 2 DAYS Performed at [facility], [place] [address], [place], [place] [address]    Report Status PENDING  Incomplete  Culture, blood (Routine x 2)     Status: None (Preliminary result)   Collection Time: 07/18/22 11:23 PM   Specimen: BLOOD  Result Value Ref Range Status   Specimen Description   Final    BLOOD BLOOD RIGHT HAND Performed at [facility] [address], [place], [place] [address]    Special Requests   Final    BOTTLES DRAWN AEROBIC AND ANAEROBIC Blood Culture adequate volume Performed at [facility] [address], [place], [place] [address]    Culture   Final    NO GROWTH 1 DAY Performed at [facility], [place] [address] [place] [address], [place],  [address]    Report Status PENDING  Incomplete     Medications:    allopurinol  300 mg Oral Daily   amitriptyline  100 mg Oral QHS   DULoxetine  60 mg Oral Daily   enoxaparin (LOVENOX) injection  40 mg Subcutaneous Q24H   fluticasone furoate-vilanterol  1 puff Inhalation Daily   And   umeclidinium bromide  1 puff Inhalation Daily   insulin aspart  0-5 Units Subcutaneous QHS   insulin aspart  0-9 Units Subcutaneous TID WC   levothyroxine  100 mcg Oral Daily   midodrine  10 mg Oral TID WC   Continuous Infusions:  ceFEPime (MAXIPIME) IV 2 g (07/20/22 0407)      LOS: 2 days   Charlynne Cousins  [facility]  07/20/2022, 8:26 AM

## 2022-07-21 DIAGNOSIS — R634 Abnormal weight loss: Secondary | ICD-10-CM | POA: Diagnosis not present

## 2022-07-21 DIAGNOSIS — R112 Nausea with vomiting, unspecified: Secondary | ICD-10-CM | POA: Diagnosis not present

## 2022-07-21 DIAGNOSIS — N3001 Acute cystitis with hematuria: Secondary | ICD-10-CM | POA: Diagnosis not present

## 2022-07-21 DIAGNOSIS — K909 Intestinal malabsorption, unspecified: Secondary | ICD-10-CM | POA: Diagnosis not present

## 2022-07-21 DIAGNOSIS — Z86010 Personal history of colonic polyps: Secondary | ICD-10-CM

## 2022-07-21 DIAGNOSIS — N181 Chronic kidney disease, stage 1: Secondary | ICD-10-CM

## 2022-07-21 DIAGNOSIS — E1129 Type 2 diabetes mellitus with other diabetic kidney complication: Secondary | ICD-10-CM

## 2022-07-21 DIAGNOSIS — E039 Hypothyroidism, unspecified: Secondary | ICD-10-CM

## 2022-07-21 LAB — GLUCOSE, CAPILLARY
Glucose-Capillary: 77 mg/dL (ref 70–99)
Glucose-Capillary: 79 mg/dL (ref 70–99)
Glucose-Capillary: 81 mg/dL (ref 70–99)
Glucose-Capillary: 84 mg/dL (ref 70–99)
Glucose-Capillary: 88 mg/dL (ref 70–99)

## 2022-07-21 MED ORDER — LIP MEDEX EX OINT
TOPICAL_OINTMENT | CUTANEOUS | Status: DC | PRN
  Administered 2022-07-21: 75 via TOPICAL
  Filled 2022-07-21: qty 7

## 2022-07-21 NOTE — H&P (View-Only) (Signed)
     Consultation  Referring Provider: [facility] Primary Care Physician:  Tysinger, David S, PA-C Primary Gastroenterologist:  Dr. Armbruster       Reason for Consultation: Intractable nausea and vomiting      LOS: 3 days          HPI:   Brandi Stephens is a 76 y.o. 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  Patient returns to ED 4/1 with nausea and vomiting ongoing since October 2023, though getting progressively worse since January 2024. CT abdomen pelvis with contrast 06/20/2022: Distal esophageal wall thickening suggesting esophagitis, large stool burden, normal pancreas.  Patient's daughter is in the room and aids with history. Reports nausea and vomiting since October 2023. Acutely [place] Reports this being stressful for her and feels that played a role as well. Nausea/vomiting occurs without trigger. Not associated with eating. Happens on a daily basis. Denies reflux. Nonbloody bilious vomiting. Able to tolerate some foods like soups. Patient was put on ozempic sometime in 2023 for A1C of 6.7 and to help with weight loss. Patient denies having history of diabetes and states she is "pre-diabetic." Patient reports 80lb weight loss in the last year. Has tried outpatient Protonix without improvement. Denies NSAID use. 3 glasses of wine per week. Denies abdominal pain, melena, hematochezia. Reports oily stools.  No previous episodes of pancreatitis.  Lipase 60. [place] an EGD while she is here.  States as a result of recurrent nausea and vomiting she has had orthostatic hypotension that has subsequently caused  weakness and recurrent falls.  Colonoscopy 01/05/2018 with Dr. Armbruster for surveillance showed torturous colon , 11 polyps (tubular adenomas and hyperplastic), anal papillae.  Most of polyps were adenomatous but some were hyperplastic.  Anal papillae biopsied as AIN-1, low-grade dysplasia.  Recommended follow-up with colorectal surgeon. Repeat 3 years.  ED course:Tachycardic on arrival to ED with heart rate in the 120s and blood pressure soft with systolic in the 90s.  Labs showing no leukocytosis, potassium 3.3, creatinine 1.1 (baseline 0.6-0.8), normal LFTs, lipase 96, lactic acid 2.1> 1.8.  UA with large amount of leukocytes and microscopy showing 21-50 RBCs, >50 WBCs, and many bacteria. Chest x-ray showing no pulmonary edema or focal pulmonary consolidation. Patient received cefepime and 3 L LR boluses in the ED.  Blood pressure and heart rate subsequently improved.   Past Medical History:  Diagnosis Date   AIN (anal intraepithelial neoplasia) anal canal    Anemia    with pregnancy   Anxiety    Arthritis    knees, lower back, knees   Chronic constipation    Chronic diastolic CHF (congestive heart failure) 07/05/2019   CKD (chronic kidney disease)    Coarse tremors    Essential   COPD (chronic obstructive pulmonary disease)    2017 chest CT, history of   Degenerative lumbar spinal stenosis    Depression    Depression 07/05/2019   Drug dependency    Elevated PTHrP level 05/09/2019   EtOH use 07/05/2019   Pt denies heavy drinking however   GERD (gastroesophageal reflux disease)    pt denies   Gout    Grade I diastolic dysfunction   02/01/2016   Noted ECHO   Hemorrhoids    History of adenomatous polyp of colon    History of basal cell carcinoma (BCC) excision 2015   nose   History of sepsis 05/14/2014   post lumbar surgery   History of shingles    x2   Hyperlipidemia    Hypertension    Hypothyroidism    Irregular heart beat    history of while undergoing radiation    Malignant neoplasm of upper-inner quadrant of left breast in female, estrogen receptor positive oncologist-  dr magrinat--  per lov in epic no recurrence   dx 07-13-2015  Left breast invasive ductal carcinoma, Stage IA, Grade 1 (TXN0),  09-16-2015  s/p  left breast lumpectomy with sln bx's,  completed radiation 11-18-2015,  started antiestrogen therapy 12-14-2015   Neuropathy    Left foot and back of left leg   Obesity (BMI 30-39.9) 07/05/2019   Personal history of radiation therapy    completed 11-18-2015  left breast   Pneumonia    PONV (postoperative nausea and vomiting)    Prediabetes    diet controlled, no med   Restless leg syndrome    Seasonal allergies    Seizure 05/2015   due to sepsis, only one time episode   Tremor    Wears partial dentures    lower     Surgical History:  She  has a past surgical history that includes Excision basal cell carcinoma (10/15); Breast reduction surgery (Bilateral, 1998); Cervical spine surgery (1995); Knee arthroscopy (Right, 2002); Hemorrhoid surgery (2008); Hand surgery (Left, 02-19-2002   dr kuzma  @[facility]); Breast lumpectomy with radioactive seed and sentinel lymph node biopsy (Left, 09/16/2015); Breast excisional biopsy (Left, 1972); Rotator cuff repair (Left, 04/2016); Lumbar spine surgery (03-20-2015   dr nundkumar); Colonoscopy (01/2013); Multiple tooth extractions; Breast biopsy (Right, 08/24/2015); Breast biopsy (Right, 07/13/2015); Breast biopsy (Left, 07/13/2015); Abdominal hysterectomy (1985); Right colectomy (09-10-2003   dr streck @[facility]); Tubal ligation (Bilateral, 1982); Tonsillectomy (1969); Eye surgery (Left, 2016); Cataract extraction w/ intraocular lens implant (Left, 2016); High resolution anoscopy (N/A, 02/28/2018); Mouth surgery (07/14/2018); Breast lumpectomy (Left); Reduction mammaplasty (Bilateral); and Hardware Removal (N/A, 07/24/2019). Family History:  Her family history includes Atrial fibrillation in her mother; Brain cancer in  her maternal aunt; Breast cancer in her maternal aunt; Lung cancer in her maternal aunt and maternal uncle; Prostate cancer in her maternal grandfather; Stomach cancer in her maternal grandmother; Stroke in her paternal grandmother; Tuberculosis in her paternal grandfather; Ulcers in her father. Social History:   reports that she quit smoking about 4 years ago. Her smoking use included cigarettes. She has a 17.50 pack-year smoking history. She has never used smokeless tobacco. She reports current alcohol use. She reports that she does not currently use drugs.  Prior to Admission medications   Medication Sig Start Date End Date Taking? Authorizing Provider  allopurinol (ZYLOPRIM) 300 MG tablet Take 300 mg by mouth daily.   Yes [provider]  amitriptyline (ELAVIL) 100 MG tablet Take 1 tablet (100 mg total) by mouth at bedtime. 07/05/22  Yes Raulkar, Krutika P, MD  atorvastatin (LIPITOR) 80 MG tablet Take 1 tablet (80 mg total) by mouth daily. 06/10/22  Yes Tysinger, David S, PA-C  Biotin 3 MG TABS Take 3 mg by mouth daily.   Yes [provider]  cefadroxil (DURICEF) 500 MG capsule Take 1 capsule (500 mg total) by mouth 2 (two) times daily. 07/14/22  Yes Campbell, John, MD  Cholecalciferol (  VITAMIN D3) 50 MCG (2000 UT) capsule Take 1 capsule (2,000 Units total) by mouth daily. 08/10/21  Yes Tysinger, David S, PA-C  diazepam (VALIUM) 10 MG tablet Take 1 tablet (10 mg total) by mouth daily as needed for anxiety. 07/05/22  Yes Raulkar, Krutika P, MD  DULoxetine (CYMBALTA) 60 MG capsule TAKE 1 CAPSULE(60 MG) BY MOUTH DAILY Patient taking differently: Take 60 mg by mouth daily. 06/10/22  Yes Tysinger, David S, PA-C  Fluticasone-Umeclidin-Vilant ([name] ELLIPTA) 100-62.5-25 MCG/ACT AEPB Inhale 1 puff into the lungs daily. 04/21/22  Yes Tysinger, David S, PA-C  HYDROcodone-acetaminophen (NORCO/VICODIN) 5-325 MG tablet Take 1 tablet by mouth every 4 (four) hours as needed for moderate pain.  08/27/21 08/27/22 Yes Raulkar, Krutika P, MD  Magnesium Gluconate 250 MG TABS Take 1 tablet by mouth daily.   Yes [provider]  midodrine (PROAMATINE) 10 MG tablet Take 1 tablet (10 mg total) by mouth 3 (three) times daily with meals. 06/29/22  Yes Krishnan, Gokul, MD  polyethylene glycol powder (GLYCOLAX/MIRALAX) 17 GM/SCOOP powder Take 17 g by mouth daily as needed for moderate constipation.   Yes [provider]  Probiotic Product (PROBIOTIC DAILY PO) Take 1 tablet by mouth daily.   Yes [provider]  Propylene Glycol (SYSTANE BALANCE OP) Place 1 drop into both eyes 4 (four) times daily as needed (dry eyes).   Yes [provider]  rOPINIRole (REQUIP) 1 MG tablet TAKE 1 TABLET(1 MG) BY MOUTH AT BEDTIME Patient taking differently: Take 1 mg by mouth at bedtime. 06/13/22  Yes Tysinger, David S, PA-C  Semaglutide, 1 MG/DOSE, 4 MG/3ML SOPN Inject 1 mg as directed once a week. 06/10/22  Yes Tysinger, David S, PA-C  topiramate (TOPAMAX) 25 MG tablet Take 3 tablets (75 mg total) by mouth at bedtime. 07/05/22  Yes Raulkar, Krutika P, MD  colchicine 0.6 MG tablet Take 2 tablets at once then take 1 tablet an hour later then then 1 tablet twice daily x 3 days. Patient not taking: Reported on 07/19/2022 08/10/21   Tysinger, David S, PA-C  Continuous Blood Gluc Sensor (FREESTYLE LIBRE 3 SENSOR) MISC Place 1 sensor on the skin every 14 days. Use to check glucose continuously 06/13/22   Tysinger, David S, PA-C  levothyroxine (SYNTHROID) 100 MCG tablet Take 1 tablet (100 mcg total) by mouth daily. 06/29/22 06/29/23  Krishnan, Gokul, MD  OXYGEN Inhale 2 L into the lungs. Patient not taking: Reported on 07/19/2022    [provider]  pantoprazole (PROTONIX) 40 MG tablet Take 1 tablet (40 mg total) by mouth daily for 14 days. 06/29/22 07/13/22  Krishnan, Gokul, MD  primidone (MYSOLINE) 50 MG tablet TAKE 1 TABLET BY MOUTH TWICE DAILY Patient not taking: Reported on 07/19/2022 11/01/21    Raulkar, Krutika P, MD    Current Facility-Administered Medications  Medication Dose Route Frequency Provider Last Rate Last Admin   acetaminophen (TYLENOL) tablet 650 mg  650 mg Oral Q6H PRN Rathore, Vasundhra, MD   650 mg at 07/20/22 1651   Or   acetaminophen (TYLENOL) suppository 650 mg  650 mg Rectal Q6H PRN Rathore, Vasundhra, MD       allopurinol (ZYLOPRIM) tablet 300 mg  300 mg Oral Daily Goodrich, Daniel P, MD   300 mg at 07/21/22 0949   amitriptyline (ELAVIL) tablet 100 mg  100 mg Oral QHS Goodrich, Daniel P, MD   100 mg at 07/20/22 2121   camphor-menthol (SARNA) lotion   Topical PRN Daniels, James K, NP     1 Application at 07/20/22 0843   diazepam (VALIUM) tablet 10 mg  10 mg Oral Daily PRN Goodrich, Daniel P, MD   10 mg at 07/21/22 0351   DULoxetine (CYMBALTA) DR capsule 60 mg  60 mg Oral Daily Goodrich, Daniel P, MD   60 mg at 07/21/22 0949   enoxaparin (LOVENOX) injection 40 mg  40 mg Subcutaneous Q24H Rathore, Vasundhra, MD   40 mg at 07/21/22 0949   fluticasone furoate-vilanterol (BREO ELLIPTA) 100-25 MCG/ACT 1 puff  1 puff Inhalation Daily Goodrich, Daniel P, MD   1 puff at 07/21/22 0845   And   umeclidinium bromide (INCRUSE ELLIPTA) 62.5 MCG/ACT 1 puff  1 puff Inhalation Daily Goodrich, Daniel P, MD   1 puff at 07/21/22 0846   HYDROcodone-acetaminophen (NORCO/VICODIN) 5-325 MG per tablet 1 tablet  1 tablet Oral Q4H PRN Goodrich, Daniel P, MD   1 tablet at 07/20/22 1508   insulin aspart (novoLOG) injection 0-5 Units  0-5 Units Subcutaneous QHS Rathore, Vasundhra, MD       insulin aspart (novoLOG) injection 0-9 Units  0-9 Units Subcutaneous TID WC Rathore, Vasundhra, MD       levothyroxine (SYNTHROID) tablet 100 mcg  100 mcg Oral Daily Goodrich, Daniel P, MD   100 mcg at 07/21/22 0646   midodrine (PROAMATINE) tablet 5 mg  5 mg Oral TID WC Feliz Ortiz, Abraham, MD   5 mg at 07/21/22 0828   ondansetron (ZOFRAN) injection 4 mg  4 mg Intravenous Q6H PRN Feliz Ortiz, Abraham, MD   4  mg at 07/20/22 1145   pantoprazole (PROTONIX) EC tablet 40 mg  40 mg Oral BID Feliz Ortiz, Abraham, MD   40 mg at 07/21/22 0949    Allergies as of 07/18/2022 - Review Complete 07/18/2022  Allergen Reaction Noted   Ampicillin Hives and Other (See Comments) 09/30/2015   Gabapentin Swelling 02/12/2019   Penicillins Hives and Other (See Comments) 03/11/2011   Other Itching 02/26/2018    Review of Systems:    Review of Systems  Constitutional:  Positive for weight loss. Negative for chills, fever and malaise/fatigue.  HENT:  Negative for hearing loss and tinnitus.   Eyes:  Negative for blurred vision and double vision.  Respiratory:  Negative for cough and hemoptysis.   Cardiovascular:  Negative for chest pain and palpitations.  Gastrointestinal:  Positive for abdominal pain, nausea and vomiting. Negative for blood in stool, constipation, diarrhea (oily stools), heartburn and melena.  Genitourinary:  Negative for dysuria and urgency.  Musculoskeletal:  Negative for myalgias and neck pain.  Skin:  Negative for itching and rash.  Neurological:  Negative for dizziness and headaches.  Endo/Heme/Allergies:  Negative for environmental allergies. Does not bruise/bleed easily.  Psychiatric/Behavioral:  Negative for depression and suicidal ideas.        Physical Exam:  Vital signs in last 24 hours: Temp:  [97.7 F (36.5 C)-98.1 F (36.7 C)] 97.7 F (36.5 C) (04/04 0639) Pulse Rate:  [67-95] 95 (04/04 0639) Resp:  [20] 20 (04/04 0639) BP: (113-127)/(77-82) 122/77 (04/04 0639) SpO2:  [90 %-96 %] 93 % (04/04 0847) Last BM Date : 07/20/22 Last BM recorded by nurses in past 5 days No data recorded  Physical Exam   LAB RESULTS: Recent Labs    07/18/22 1254 07/19/22 0428  WBC 6.8 7.4  HGB 14.3 12.6  HCT 43.5 40.1  PLT 280 236   BMET Recent Labs    07/18/22 1254 07/19/22 0428 07/20/22 0446  NA 144 143   142  K 3.3* 3.0* 3.9  CL 105 110 110  CO2 26 26 24  GLUCOSE 119* 83 83   BUN 23 18 12  CREATININE 1.11* 0.94 0.89  CALCIUM 10.1 9.0 9.3   LFT Recent Labs    07/19/22 0428  PROT 5.2*  ALBUMIN 3.1*  AST 16  ALT 14  ALKPHOS 69  BILITOT 0.8   PT/INR Recent Labs    07/18/22 1600  LABPROT 13.4  INR 1.0     Impression    Intractable nausea and vomiting/steatorrhea/weight loss; R/O pancreatic insufficiency. R/O gastroparesis, esophagitis - multifactorial etiologies of symptoms. Suspect weight loss is a combination of ozempic use, decreased caloric intake, and possible pancreatic insufficiency - suspect nausea/vomiting likely secondary to Ozempic, GERD. Though cannot rule out gastroparesis vs esophagitis/gastritis. Distal esophageal wall thickening seen on CT 06/2022 possibly from recurrent nausea/vomiting secondary to norovirus  Hypokalemia, likely from volume depletion - K 3.0  CKD stage I  UTI - urine culture unrevealing, treated empirically with abx  DM2  Chronic diastolic heart failure  Hypothyroidism - TSH 3.1    Plan   - Obtain pancreatic elastase stool study - Continue supportive care with antiemetics as needed - Patient requests EGD while she is here.  Will discuss with Dr. Izen Petz if EGD is warranted at this time as an inpatient versus getting procedure done as an outpatient. Spot held for Friday 4/5. NPO midnight tonight if plan to proceed with EGD. -Currently on soft diet and tolerating without difficulty.  Can continue current diet, though if nausea and vomiting recurs would recommend clear liquid diet or n.p.o. -Consider repeat colonoscopy as an outpatient as she was due for surveillance as of 12/2020  Thank you for your kind consultation, we will continue to follow.  Bayley M McMichael  07/21/2022, 9:56 AM    Attending Physician Note   I have taken a history, reviewed the chart and examined the patient. I performed a substantive portion of this encounter, including complete performance of at least one of the key components, in  conjunction with the APP. I agree with the APP's note, impression and recommendations with my edits. My additional impressions and recommendations are as follows.   *Persistent nausea, vomiting and weight loss. CT AP in March 2024 showed distal esophageal wall thickening. Suspect semaglutide side effects and GERD. R/O esophagitis, ulcer, gastritis, gastroparesis. Hold semaglutide. Pantoprazole 40 mg bid and Zofran IV for now. EGD tomorrow to further evaluate.   *Personal history of adenomatous colon polyps. Follow up with Dr. Armbruster as outpatient to consider surveillance colonoscopy.   *Chronic diastolic HF  *DM2  *CKD1  Tyteanna Ost, MD FACG See [facility], [facility], for our on call provider  

## 2022-07-21 NOTE — Progress Notes (Signed)
[facility] PROGRESS NOTE    Progress Note  [name]  [id number] DOB: April 23, 1945 DOA: 07/18/2022 PCP: Carlena Hurl, PA-C     Brief Narrative:   Brandi Stephens is an 77 y.o. female history of diabetes mellitus comes in with vomiting admitted for UTI  Assessment/Plan:   UTI (urinary tract infection) Urine culture was unrevealing. Treated empirically antibiotics, transition to oral Omnicef.  Intractable nausea and vomiting of unclear etiology: Continues to have nausea and vomiting is not able to tolerate her diet she threw up twice last night despite being on Protonix. TSH, B12, RPR negative. She is lost over 70 pounds in last 3 months. Consult GI might need endoscopy.  Chronic kidney disease stage I: at baseline.  Hypokalemia/hypomagnesemia: Repleted orally now improved, currently potassium greater than 4 magnesium greater than 2.  Non-insulin-dependent diabetes mellitus type 2: With A1c of 5.8 CBG stable continue sliding scale.  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  Can resume at discharge.  Chronic diastolic heart failure: Appears euvolemic.  Hypothyroidism: Continue Synthroid.   DVT prophylaxis: lovenox Family Communication:none Status is: Inpatient Remains inpatient appropriate because: Acute UTI    Code Status:     Code Status Orders  (From admission, onward)           Start     Ordered   07/19/22 0006  Full code  Continuous       Question:  By:  Answer:  Consent: discussion documented in EHR   07/19/22 0006           Code Status History     Date Active Date Inactive Code Status Order ID Comments User Context   06/20/2022 2220 06/29/2022 2136 Full Code [id number]  Etta Quill, DO Inpatient   01/18/2021 2332 01/26/2021 2020 Full Code [id number]  Shela Leff, MD ED   08/02/2019 1454 08/10/2019 1717 Full Code  [id number]  Cathlyn Parsons, PA-C Inpatient   08/02/2019 1454 08/02/2019 1454 Full Code [id number]  Cathlyn Parsons, PA-C Inpatient   07/11/2019 1805 08/02/2019 1426 Full Code [id number]  Traci Sermon, PA-C Inpatient   07/11/2019 1311 07/11/2019 1656 Full Code [id number]  Traci Sermon, PA-C Inpatient   06/28/2019 1614 07/11/2019 1310 Full Code [id number]  Cathlyn Parsons, PA-C Inpatient   06/28/2019 1613 06/28/2019 1614 Full Code [id number]  Cathlyn Parsons, PA-C Inpatient   06/25/2019 0637 06/28/2019 1612 Full Code [id number]  Vianne Bulls, MD Inpatient   05/30/2019 1926 05/31/2019 1705 Full Code [id number]  Traci Sermon, PA-C Inpatient   03/01/2019 2349 03/04/2019 1658 Full Code [id number]  Cristescu, Linard Millers, MD ED   06/29/2018 1820 06/30/2018 1511 Full Code [id number]  Elpidio Eric Inpatient   05/15/2015 1839 05/21/2015 1843 Full Code [id number]  Rosemarie Ax, MD Inpatient   03/20/2015 1959 03/25/2015 2204 Full Code [id number]  [name], MD Inpatient         IV Access:   Peripheral IV   Procedures and diagnostic studies:   No results found.   Medical Consultants:   None.   Subjective:    Brandi Stephens she relates to continues to have ongoing vomiting Protonix has not helped.  Objective:    Vitals:   07/20/22 1124 07/20/22 2058 07/21/22 0639 07/21/22 0847  BP: 113/81 127/82 122/77   Pulse: 91 67 95   Resp: 20 20 20    Temp: 98.1  F (36.7 C) 97.9 F (36.6 C) 97.7 F (36.5 C)   TempSrc: Oral Oral Oral   SpO2: 92% 96% 90% 93%  Weight:      Height:       SpO2: 93 % (rechecked)   Intake/Output Summary (Last 24 hours) at 07/21/2022 0936 Last data filed at 07/20/2022 1200 Gross per 24 hour  Intake 120 ml  Output --  Net 120 ml    Filed Weights   07/18/22 1247 07/19/22 0000  Weight: 78.9 kg 83.6 kg    Exam: General exam: In no acute distress. Respiratory system: Good air movement and clear to  auscultation. Cardiovascular system: S1 & S2 heard, RRR. No JVD. Gastrointestinal system: Abdomen is nondistended, soft and nontender.  Extremities: No pedal edema. Skin: No rashes, lesions or ulcers Psychiatry: Judgement and insight appear normal. Mood & affect appropriate. Data Reviewed:    Labs: Basic Metabolic Panel: Recent Labs  Lab 07/18/22 1254 07/19/22 0428 07/20/22 0446  NA 144 143 142  K 3.3* 3.0* 3.9  CL 105 110 110  CO2 26 26 24   GLUCOSE 119* 83 83  BUN 23 18 12   CREATININE 1.11* 0.94 0.89  CALCIUM 10.1 9.0 9.3  MG  --  1.5* 1.8    GFR Estimated Creatinine Clearance: 60.7 mL/min (by C-G formula based on SCr of 0.89 mg/dL). Liver Function Tests: Recent Labs  Lab 07/18/22 1254 07/19/22 0428  AST 19 16  ALT 13 14  ALKPHOS 77 69  BILITOT 0.7 0.8  PROT 6.5 5.2*  ALBUMIN 3.9 3.1*    Recent Labs  Lab 07/18/22 1254 07/19/22 0428  LIPASE 96* 60*    No results for input(s): "AMMONIA" in the last 168 hours. Coagulation profile Recent Labs  Lab 07/18/22 1600  INR 1.0    COVID-19 Labs  No results for input(s): "DDIMER", "FERRITIN", "LDH", "CRP" in the last 72 hours.  Lab Results  Component Value Date   SARSCOV2NAA NEGATIVE 06/20/2022   [place] NEGATIVE 01/18/2021   [place] NEGATIVE 05/11/2020   [place] NEGATIVE 06/24/2019    CBC: Recent Labs  Lab 07/18/22 1254 07/19/22 0428  WBC 6.8 7.4  HGB 14.3 12.6  HCT 43.5 40.1  MCV 94.4 96.9  PLT 280 236    Cardiac Enzymes: No results for input(s): "CKTOTAL", "CKMB", "CKMBINDEX", "TROPONINI" in the last 168 hours. BNP (last 3 results) No results for input(s): "PROBNP" in the last 8760 hours. CBG: Recent Labs  Lab 07/20/22 0812 07/20/22 1121 07/20/22 1613 07/20/22 2059 07/21/22 0747  GLUCAP 77 98 94 90 81    D-Dimer: No results for input(s): "DDIMER" in the last 72 hours. Hgb A1c: No results for input(s): "HGBA1C" in the last 72 hours. Lipid Profile: No results for  input(s): "CHOL", "HDL", "LDLCALC", "TRIG", "CHOLHDL", "LDLDIRECT" in the last 72 hours. Thyroid function studies: Recent Labs    07/20/22 0846  TSH 3.158   Anemia work up: Recent Labs    07/20/22 0846  VITAMINB12 823   Sepsis Labs: Recent Labs  Lab 07/18/22 1254 07/18/22 1505 07/18/22 1652 07/19/22 0428  WBC 6.8  --   --  7.4  LATICACIDVEN  --  2.1* 1.8  --     Microbiology Recent Results (from the past 240 hour(s))  Urine Culture (for pregnant, neutropenic or urologic patients or patients with an indwelling urinary catheter)     Status: None   Collection Time: 07/18/22 12:54 PM   Specimen: Urine, Clean Catch  Result Value Ref Range Status  Specimen Description   Final    URINE, CLEAN CATCH Performed at [facility], [address] [place], [place] [address]    Special Requests   Final    NONE Performed at [facility], [address] [place], [place] [address]    Culture   Final    NO GROWTH Performed at [facility], [place] [address], [place], [place] [address]    Report Status 07/19/2022 FINAL  Final  Culture, blood (Routine x 2)     Status: None (Preliminary result)   Collection Time: 07/18/22  3:05 PM   Specimen: BLOOD RIGHT ARM  Result Value Ref Range Status   Specimen Description   Final    BLOOD RIGHT ARM Performed at [facility], [place] [address], [place], [place] [address]    Special Requests   Final    BOTTLES DRAWN AEROBIC AND ANAEROBIC Blood Culture adequate volume Performed at [facility], [address], [place], [place] [address]    Culture   Final    NO GROWTH 3 DAYS Performed at [facility], [place] [address], [place], [place] [address]    Report Status PENDING  Incomplete  Culture, blood (Routine x 2)     Status: None (Preliminary result)   Collection Time: 07/18/22 11:23 PM   Specimen: BLOOD  Result Value Ref Range Status   Specimen Description   Final     BLOOD BLOOD RIGHT HAND Performed at [facility] [address] [place], [place] [address]    Special Requests   Final    BOTTLES DRAWN AEROBIC AND ANAEROBIC Blood Culture adequate volume Performed at [facility] [address], [place], [place] [address]    Culture   Final    NO GROWTH 2 DAYS Performed at [facility] [address] [place],  [address]    Report Status PENDING  Incomplete     Medications:    allopurinol  300 mg Oral Daily   amitriptyline  100 mg Oral QHS   cefdinir  300 mg Oral Q12H   DULoxetine  60 mg Oral Daily   enoxaparin (LOVENOX) injection  40 mg Subcutaneous Q24H   fluticasone furoate-vilanterol  1 puff Inhalation Daily   And   umeclidinium bromide  1 puff Inhalation Daily   insulin aspart  0-5 Units Subcutaneous QHS   insulin aspart  0-9 Units Subcutaneous TID WC   levothyroxine  100 mcg Oral Daily   midodrine  5 mg Oral TID WC   pantoprazole  40 mg Oral BID   Continuous Infusions:      LOS: 3 days   Charlynne Cousins  [facility]  07/21/2022, 9:36 AM

## 2022-07-21 NOTE — Progress Notes (Signed)
Mobility Specialist - Progress Note   07/21/22 1114  Mobility  Activity Ambulated with assistance in hallway;Ambulated with assistance to bathroom  Level of Assistance Standby assist, set-up cues, supervision of patient - no hands on  Assistive Device Front wheel walker  Distance Ambulated (ft) 160 ft  Activity Response Tolerated well  Mobility Referral Yes  $Mobility charge 1 Mobility   Pt received in bed and agreeable to mobility. Prior to ambulating, pt requested to use bathroom. Pt felt as if she was more unstable today compared to yesterday. No other complaints during session. Pt to bed after session w/ all needs met.   [facility]

## 2022-07-21 NOTE — Consult Note (Addendum)
Consultation  Referring Provider: [facility] Primary Care Physician:  Carlena Hurl, PA-C Primary Gastroenterologist:  Dr. Havery Moros       Reason for Consultation: Intractable nausea and vomiting      LOS: 3 days          HPI:   Brandi Stephens is a 77 y.o. 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  Patient returns to ED 4/1 with nausea and vomiting ongoing since October 2023, though getting progressively worse since January 2024. CT abdomen pelvis with contrast 06/20/2022: Distal esophageal wall thickening suggesting esophagitis, large stool burden, normal pancreas.  Patient's daughter is in the room and aids with history. Reports nausea and vomiting since October 2023. Acutely [place] Reports this being stressful for her and feels that played a role as well. Nausea/vomiting occurs without trigger. Not associated with eating. Happens on a daily basis. Denies reflux. Nonbloody bilious vomiting. Able to tolerate some foods like soups. Patient was put on ozempic sometime in 2023 for A1C of 6.7 and to help with weight loss. Patient denies having history of diabetes and states she is "pre-diabetic." Patient reports 80lb weight loss in the last year. Has tried outpatient Protonix without improvement. Denies NSAID use. 3 glasses of wine per week. Denies abdominal pain, melena, hematochezia. Reports oily stools.  No previous episodes of pancreatitis.  Lipase 60. [place] an EGD while she is here.  States as a result of recurrent nausea and vomiting she has had orthostatic hypotension that has subsequently caused  weakness and recurrent falls.  Colonoscopy 01/05/2018 with Dr. Havery Moros for surveillance showed torturous colon , 11 polyps (tubular adenomas and hyperplastic), anal papillae.  Most of polyps were adenomatous but some were hyperplastic.  Anal papillae biopsied as AIN-1, low-grade dysplasia.  Recommended follow-up with colorectal surgeon. Repeat 3 years.  ED course:Tachycardic on arrival to ED with heart rate in the 120s and blood pressure soft with systolic in the 0000000.  Labs showing no leukocytosis, potassium 3.3, creatinine 1.1 (baseline 0.6-0.8), normal LFTs, lipase 96, lactic acid 2.1> 1.8.  UA with large amount of leukocytes and microscopy showing 21-50 RBCs, >50 WBCs, and many bacteria. Chest x-ray showing no pulmonary edema or focal pulmonary consolidation. Patient received cefepime and 3 L LR boluses in the ED.  Blood pressure and heart rate subsequently improved.   Past Medical History:  Diagnosis Date   AIN (anal intraepithelial neoplasia) anal canal    Anemia    with pregnancy   Anxiety    Arthritis    knees, lower back, knees   Chronic constipation    Chronic diastolic CHF (congestive heart failure) 07/05/2019   CKD (chronic kidney disease)    Coarse tremors    Essential   COPD (chronic obstructive pulmonary disease)    2017 chest CT, history of   Degenerative lumbar spinal stenosis    Depression    Depression 07/05/2019   Drug dependency    Elevated PTHrP level 05/09/2019   EtOH use 07/05/2019   Pt denies heavy drinking however   GERD (gastroesophageal reflux disease)    pt denies   Gout    Grade I diastolic dysfunction  02/01/2016   Noted ECHO   Hemorrhoids    History of adenomatous polyp of colon    History of basal cell carcinoma (BCC) excision 2015   nose   History of sepsis 05/14/2014   post lumbar surgery   History of shingles    x2   Hyperlipidemia    Hypertension    Hypothyroidism    Irregular heart beat    history of while undergoing radiation    Malignant neoplasm of upper-inner quadrant of left breast in female, estrogen receptor positive oncologist-  dr Jana Hakim--  per lov in epic no recurrence   dx 07-13-2015  Left breast invasive ductal carcinoma, Stage IA, Grade 1 (TXN0),  09-16-2015  s/p  left breast lumpectomy with sln bx's,  completed radiation 11-18-2015,  started antiestrogen therapy 12-14-2015   Neuropathy    Left foot and back of left leg   Obesity (BMI 30-39.9) 07/05/2019   Personal history of radiation therapy    completed 11-18-2015  left breast   Pneumonia    PONV (postoperative nausea and vomiting)    Prediabetes    diet controlled, no med   Restless leg syndrome    Seasonal allergies    Seizure 05/2015   due to sepsis, only one time episode   Tremor    Wears partial dentures    lower     Surgical History:  She  has a past surgical history that includes Excision basal cell carcinoma (10/15); Breast reduction surgery (Bilateral, 1998); Cervical spine surgery (1995); Knee arthroscopy (Right, 2002); Hemorrhoid surgery (2008); Hand surgery (Left, 02-19-2002   dr Fredna Dow  @[facility] ); Breast lumpectomy with radioactive seed and sentinel lymph node biopsy (Left, 09/16/2015); Breast excisional biopsy (Left, 1972); Rotator cuff repair (Left, 04/2016); Lumbar spine surgery (03-20-2015   dr Kathyrn Sheriff); Colonoscopy (01/2013); Multiple tooth extractions; Breast biopsy (Right, 08/24/2015); Breast biopsy (Right, 07/13/2015); Breast biopsy (Left, 07/13/2015); Abdominal hysterectomy (1985); Right colectomy (09-10-2003   dr Margot Chimes @[facility] ); Tubal ligation (Bilateral, 1982); Tonsillectomy (1969); Eye surgery (Left, 2016); Cataract extraction w/ intraocular lens implant (Left, 2016); High resolution anoscopy (N/A, 02/28/2018); Mouth surgery (07/14/2018); Breast lumpectomy (Left); Reduction mammaplasty (Bilateral); and Hardware Removal (N/A, 07/24/2019). Family History:  Her family history includes Atrial fibrillation in her mother; Brain cancer in  her maternal aunt; Breast cancer in her maternal aunt; Lung cancer in her maternal aunt and maternal uncle; Prostate cancer in her maternal grandfather; Stomach cancer in her maternal grandmother; Stroke in her paternal grandmother; Tuberculosis in her paternal grandfather; Ulcers in her father. Social History:   reports that she quit smoking about 4 years ago. Her smoking use included cigarettes. She has a 17.50 pack-year smoking history. She has never used smokeless tobacco. She reports current alcohol use. She reports that she does not currently use drugs.  Prior to Admission medications   Medication Sig Start Date End Date Taking? Authorizing Provider  allopurinol (ZYLOPRIM) 300 MG tablet Take 300 mg by mouth daily.   Yes [provider]  amitriptyline (ELAVIL) 100 MG tablet Take 1 tablet (100 mg total) by mouth at bedtime. 07/05/22  Yes Raulkar, Clide Deutscher, MD  atorvastatin (LIPITOR) 80 MG tablet Take 1 tablet (80 mg total) by mouth daily. 06/10/22  Yes Tysinger, Camelia Eng, PA-C  Biotin 3 MG TABS Take 3 mg by mouth daily.   Yes [provider]  cefadroxil (DURICEF) 500 MG capsule Take 1 capsule (500 mg total) by mouth 2 (two) times daily. 07/14/22  Yes [name], MD  Cholecalciferol (  VITAMIN D3) 50 MCG (2000 UT) capsule Take 1 capsule (2,000 Units total) by mouth daily. 08/10/21  Yes Tysinger, Camelia Eng, PA-C  diazepam (VALIUM) 10 MG tablet Take 1 tablet (10 mg total) by mouth daily as needed for anxiety. 07/05/22  Yes Raulkar, Clide Deutscher, MD  DULoxetine (CYMBALTA) 60 MG capsule TAKE 1 CAPSULE(60 MG) BY MOUTH DAILY Patient taking differently: Take 60 mg by mouth daily. 06/10/22  Yes Tysinger, Camelia Eng, PA-C  Fluticasone-Umeclidin-Vilant ([name] ELLIPTA) 100-62.5-25 MCG/ACT AEPB Inhale 1 puff into the lungs daily. 04/21/22  Yes Tysinger, Camelia Eng, PA-C  HYDROcodone-acetaminophen (NORCO/VICODIN) 5-325 MG tablet Take 1 tablet by mouth every 4 (four) hours as needed for moderate pain.  08/27/21 08/27/22 Yes Raulkar, Clide Deutscher, MD  Magnesium Gluconate 250 MG TABS Take 1 tablet by mouth daily.   Yes [provider]  midodrine (PROAMATINE) 10 MG tablet Take 1 tablet (10 mg total) by mouth 3 (three) times daily with meals. 06/29/22  Yes Bonnielee Haff, MD  polyethylene glycol powder (GLYCOLAX/MIRALAX) 17 GM/SCOOP powder Take 17 g by mouth daily as needed for moderate constipation.   Yes [provider]  Probiotic Product (PROBIOTIC DAILY PO) Take 1 tablet by mouth daily.   Yes [provider]  Propylene Glycol (SYSTANE BALANCE OP) Place 1 drop into both eyes 4 (four) times daily as needed (dry eyes).   Yes [provider]  rOPINIRole (REQUIP) 1 MG tablet TAKE 1 TABLET(1 MG) BY MOUTH AT BEDTIME Patient taking differently: Take 1 mg by mouth at bedtime. 06/13/22  Yes Tysinger, Camelia Eng, PA-C  Semaglutide, 1 MG/DOSE, 4 MG/3ML SOPN Inject 1 mg as directed once a week. 06/10/22  Yes Tysinger, Camelia Eng, PA-C  topiramate (TOPAMAX) 25 MG tablet Take 3 tablets (75 mg total) by mouth at bedtime. 07/05/22  Yes Raulkar, Clide Deutscher, MD  colchicine 0.6 MG tablet Take 2 tablets at once then take 1 tablet an hour later then then 1 tablet twice daily x 3 days. Patient not taking: Reported on 07/19/2022 08/10/21   Tysinger, Camelia Eng, PA-C  Continuous Blood Gluc Sensor (FREESTYLE LIBRE 3 SENSOR) MISC Place 1 sensor on the skin every 14 days. Use to check glucose continuously 06/13/22   Tysinger, Camelia Eng, PA-C  levothyroxine (SYNTHROID) 100 MCG tablet Take 1 tablet (100 mcg total) by mouth daily. 06/29/22 06/29/23  Bonnielee Haff, MD  OXYGEN Inhale 2 L into the lungs. Patient not taking: Reported on 07/19/2022    [provider]  pantoprazole (PROTONIX) 40 MG tablet Take 1 tablet (40 mg total) by mouth daily for 14 days. 06/29/22 07/13/22  Bonnielee Haff, MD  primidone (MYSOLINE) 50 MG tablet TAKE 1 TABLET BY MOUTH TWICE DAILY Patient not taking: Reported on 07/19/2022 11/01/21    Izora Ribas, MD    Current Facility-Administered Medications  Medication Dose Route Frequency Provider Last Rate Last Admin   acetaminophen (TYLENOL) tablet 650 mg  650 mg Oral Q6H PRN Shela Leff, MD   650 mg at 07/20/22 1651   Or   acetaminophen (TYLENOL) suppository 650 mg  650 mg Rectal Q6H PRN Shela Leff, MD       allopurinol (ZYLOPRIM) tablet 300 mg  300 mg Oral Daily Samuella Cota, MD   300 mg at 07/21/22 0949   amitriptyline (ELAVIL) tablet 100 mg  100 mg Oral QHS Samuella Cota, MD   100 mg at 07/20/22 2121   camphor-menthol (SARNA) lotion   Topical PRN Kathryne Eriksson, NP  1 Application at [id number]   diazepam (VALIUM) tablet 10 mg  10 mg Oral Daily PRN Samuella Cota, MD   10 mg at 07/21/22 0351   DULoxetine (CYMBALTA) DR capsule 60 mg  60 mg Oral Daily Samuella Cota, MD   60 mg at 07/21/22 0949   enoxaparin (LOVENOX) injection 40 mg  40 mg Subcutaneous Q24H Shela Leff, MD   40 mg at 07/21/22 0949   fluticasone furoate-vilanterol (BREO ELLIPTA) 100-25 MCG/ACT 1 puff  1 puff Inhalation Daily Samuella Cota, MD   1 puff at 07/21/22 0845   And   umeclidinium bromide (INCRUSE ELLIPTA) 62.5 MCG/ACT 1 puff  1 puff Inhalation Daily Samuella Cota, MD   1 puff at 07/21/22 0846   HYDROcodone-acetaminophen (NORCO/VICODIN) 5-325 MG per tablet 1 tablet  1 tablet Oral Q4H PRN Samuella Cota, MD   1 tablet at 07/20/22 1508   insulin aspart (novoLOG) injection 0-5 Units  0-5 Units Subcutaneous QHS Shela Leff, MD       insulin aspart (novoLOG) injection 0-9 Units  0-9 Units Subcutaneous TID WC Shela Leff, MD       levothyroxine (SYNTHROID) tablet 100 mcg  100 mcg Oral Daily Samuella Cota, MD   100 mcg at 07/21/22 0646   midodrine (PROAMATINE) tablet 5 mg  5 mg Oral TID WC Charlynne Cousins, MD   5 mg at 07/21/22 0828   ondansetron (ZOFRAN) injection 4 mg  4 mg Intravenous Q6H PRN Charlynne Cousins, MD   4  mg at 07/20/22 1145   pantoprazole (PROTONIX) EC tablet 40 mg  40 mg Oral BID Charlynne Cousins, MD   40 mg at 07/21/22 0949    Allergies as of 07/18/2022 - Review Complete 07/18/2022  Allergen Reaction Noted   Ampicillin Hives and Other (See Comments) 09/30/2015   Gabapentin Swelling 02/12/2019   Penicillins Hives and Other (See Comments) 03/11/2011   Other Itching 02/26/2018    Review of Systems:    Review of Systems  Constitutional:  Positive for weight loss. Negative for chills, fever and malaise/fatigue.  HENT:  Negative for hearing loss and tinnitus.   Eyes:  Negative for blurred vision and double vision.  Respiratory:  Negative for cough and hemoptysis.   Cardiovascular:  Negative for chest pain and palpitations.  Gastrointestinal:  Positive for abdominal pain, nausea and vomiting. Negative for blood in stool, constipation, diarrhea (oily stools), heartburn and melena.  Genitourinary:  Negative for dysuria and urgency.  Musculoskeletal:  Negative for myalgias and neck pain.  Skin:  Negative for itching and rash.  Neurological:  Negative for dizziness and headaches.  Endo/Heme/Allergies:  Negative for environmental allergies. Does not bruise/bleed easily.  Psychiatric/Behavioral:  Negative for depression and suicidal ideas.        Physical Exam:  Vital signs in last 24 hours: Temp:  [97.7 F (36.5 C)-98.1 F (36.7 C)] 97.7 F (36.5 C) (04/04 0639) Pulse Rate:  [67-95] 95 (04/04 0639) Resp:  [20] 20 (04/04 0639) BP: (113-127)/(77-82) 122/77 (04/04 0639) SpO2:  [90 %-96 %] 93 % (04/04 0847) Last BM Date : 07/20/22 Last BM recorded by nurses in past 5 days No data recorded  Physical Exam   LAB RESULTS: Recent Labs    07/18/22 1254 07/19/22 0428  WBC 6.8 7.4  HGB 14.3 12.6  HCT 43.5 40.1  PLT 280 236   BMET Recent Labs    07/18/22 1254 07/19/22 0428 07/20/22 0446  NA 144 143  142  K 3.3* 3.0* 3.9  CL 105 110 110  CO2 26 26 24   GLUCOSE 119* 83 83   BUN 23 18 12   CREATININE 1.11* 0.94 0.89  CALCIUM 10.1 9.0 9.3   LFT Recent Labs    07/19/22 0428  PROT 5.2*  ALBUMIN 3.1*  AST 16  ALT 14  ALKPHOS 69  BILITOT 0.8   PT/INR Recent Labs    07/18/22 1600  LABPROT 13.4  INR 1.0     Impression    Intractable nausea and vomiting/steatorrhea/weight loss; R/O pancreatic insufficiency. R/O gastroparesis, esophagitis - multifactorial etiologies of symptoms. Suspect weight loss is a combination of ozempic use, decreased caloric intake, and possible pancreatic insufficiency - suspect nausea/vomiting likely secondary to Ozempic, GERD. Though cannot rule out gastroparesis vs esophagitis/gastritis. Distal esophageal wall thickening seen on CT 06/2022 possibly from recurrent nausea/vomiting secondary to norovirus  Hypokalemia, likely from volume depletion - K 3.0  CKD stage I  UTI - urine culture unrevealing, treated empirically with abx  DM2  Chronic diastolic heart failure  Hypothyroidism - TSH 3.1    Plan   - Obtain pancreatic elastase stool study - Continue supportive care with antiemetics as needed - Patient requests EGD while she is here.  Will discuss with Dr. Fuller Plan if EGD is warranted at this time as an inpatient versus getting procedure done as an outpatient. Spot held for Friday 4/5. NPO midnight tonight if plan to proceed with EGD. -Currently on soft diet and tolerating without difficulty.  Can continue current diet, though if nausea and vomiting recurs would recommend clear liquid diet or n.p.o. -Consider repeat colonoscopy as an outpatient as she was due for surveillance as of 12/2020  Thank you for your kind consultation, we will continue to follow.  Bayley Radford Pax  07/21/2022, 9:56 AM    Attending Physician Note   I have taken a history, reviewed the chart and examined the patient. I performed a substantive portion of this encounter, including complete performance of at least one of the key components, in  conjunction with the APP. I agree with the APP's note, impression and recommendations with my edits. My additional impressions and recommendations are as follows.   *Persistent nausea, vomiting and weight loss. CT AP in March 2024 showed distal esophageal wall thickening. Suspect semaglutide side effects and GERD. R/O esophagitis, ulcer, gastritis, gastroparesis. Hold semaglutide. Pantoprazole 40 mg bid and Zofran IV for now. EGD tomorrow to further evaluate.   *Personal history of adenomatous colon polyps. Follow up with Dr. Havery Moros as outpatient to consider surveillance colonoscopy.   *Chronic diastolic HF  *DM2  *CKD1  Lucio Edward, MD [facility] See [facility], [facility], for our on call provider

## 2022-07-21 NOTE — Progress Notes (Signed)
Mobility Specialist - Progress Note   07/21/22 1427  Mobility  Activity Ambulated with assistance in hallway  Level of Assistance Standby assist, set-up cues, supervision of patient - no hands on  Assistive Device Front wheel walker  Distance Ambulated (ft) 350 ft  Activity Response Tolerated well  Mobility Referral Yes  $Mobility charge 1 Mobility   Pt received in bed and agreeable to mobility. No complaints during session. Pt to bed after session with all needs met.    [facility]

## 2022-07-22 ENCOUNTER — Encounter (HOSPITAL_COMMUNITY)
Admission: EM | Disposition: A | Discharge status: Home / Self Care | Payer: Self-pay | Source: Home / Self Care | Attending: Internal Medicine

## 2022-07-22 ENCOUNTER — Encounter (HOSPITAL_COMMUNITY): Payer: Self-pay | Admitting: Anesthesiology

## 2022-07-22 ENCOUNTER — Inpatient Hospital Stay (HOSPITAL_COMMUNITY): Payer: Medicare HMO | Admitting: Anesthesiology

## 2022-07-22 DIAGNOSIS — K317 Polyp of stomach and duodenum: Secondary | ICD-10-CM

## 2022-07-22 DIAGNOSIS — R634 Abnormal weight loss: Secondary | ICD-10-CM

## 2022-07-22 DIAGNOSIS — K3189 Other diseases of stomach and duodenum: Secondary | ICD-10-CM

## 2022-07-22 DIAGNOSIS — K2950 Unspecified chronic gastritis without bleeding: Secondary | ICD-10-CM | POA: Diagnosis not present

## 2022-07-22 DIAGNOSIS — K219 Gastro-esophageal reflux disease without esophagitis: Secondary | ICD-10-CM

## 2022-07-22 DIAGNOSIS — E876 Hypokalemia: Secondary | ICD-10-CM | POA: Diagnosis not present

## 2022-07-22 DIAGNOSIS — I10 Essential (primary) hypertension: Secondary | ICD-10-CM | POA: Diagnosis not present

## 2022-07-22 DIAGNOSIS — N3001 Acute cystitis with hematuria: Secondary | ICD-10-CM | POA: Diagnosis not present

## 2022-07-22 DIAGNOSIS — J449 Chronic obstructive pulmonary disease, unspecified: Secondary | ICD-10-CM

## 2022-07-22 DIAGNOSIS — D175 Benign lipomatous neoplasm of intra-abdominal organs: Secondary | ICD-10-CM | POA: Diagnosis not present

## 2022-07-22 DIAGNOSIS — Z87891 Personal history of nicotine dependence: Secondary | ICD-10-CM

## 2022-07-22 DIAGNOSIS — I5032 Chronic diastolic (congestive) heart failure: Secondary | ICD-10-CM | POA: Diagnosis not present

## 2022-07-22 DIAGNOSIS — Q402 Other specified congenital malformations of stomach: Secondary | ICD-10-CM

## 2022-07-22 HISTORY — PX: BIOPSY: SHX5522

## 2022-07-22 HISTORY — PX: ESOPHAGOGASTRODUODENOSCOPY: SHX5428

## 2022-07-22 LAB — GLUCOSE, CAPILLARY
Glucose-Capillary: 87 mg/dL (ref 70–99)
Glucose-Capillary: 92 mg/dL (ref 70–99)
Glucose-Capillary: 104 mg/dL — ABNORMAL HIGH (ref 70–99)
Glucose-Capillary: 102 mg/dL — ABNORMAL HIGH (ref 70–99)

## 2022-07-22 SURGERY — EGD (ESOPHAGOGASTRODUODENOSCOPY)
Anesthesia: Monitor Anesthesia Care

## 2022-07-22 MED ORDER — ALBUTEROL SULFATE HFA 108 (90 BASE) MCG/ACT IN AERS
INHALATION_SPRAY | RESPIRATORY_TRACT | Status: DC | PRN
  Administered 2022-07-22 (×2): 2 via RESPIRATORY_TRACT

## 2022-07-22 MED ORDER — LACTATED RINGERS IV SOLN
INTRAVENOUS | Status: AC | PRN
  Administered 2022-07-22: 1000 mL via INTRAVENOUS

## 2022-07-22 MED ORDER — PROPOFOL 500 MG/50ML IV EMUL
INTRAVENOUS | Status: DC | PRN
  Administered 2022-07-22: 130 ug/kg/min via INTRAVENOUS

## 2022-07-22 MED ORDER — LACTATED RINGERS IV SOLN: INTRAVENOUS | Status: DC | PRN

## 2022-07-22 MED ORDER — LIDOCAINE HCL 1 % IJ SOLN
INTRAMUSCULAR | Status: DC | PRN
  Administered 2022-07-22: 50 mg via INTRADERMAL

## 2022-07-22 MED ORDER — SODIUM CHLORIDE 0.9 % IV SOLN: INTRAVENOUS | Status: DC

## 2022-07-22 MED ORDER — PROPOFOL 10 MG/ML IV BOLUS
INTRAVENOUS | Status: DC | PRN
  Administered 2022-07-22 (×2): 20 mg via INTRAVENOUS

## 2022-07-22 MED ORDER — PHENYLEPHRINE HCL (PRESSORS) 10 MG/ML IV SOLN
INTRAVENOUS | Status: DC | PRN
  Administered 2022-07-22: 100 ug via INTRAVENOUS

## 2022-07-22 MED ORDER — ONDANSETRON HCL 4 MG PO TABS
4.0000 mg | ORAL_TABLET | Freq: Three times a day (TID) | ORAL | Status: DC
  Administered 2022-07-22 – 2022-07-24 (×7): 4 mg via ORAL
  Filled 2022-07-22 (×8): qty 1

## 2022-07-22 NOTE — Interval H&P Note (Signed)
History and Physical Interval Note:  07/22/2022 1:25 PM  Brandi Stephens  has presented today for surgery, with the diagnosis of intractable nausea/vomiting. abnormal CT showing esophagitis.  The various methods of treatment have been discussed with the patient and family. After consideration of risks, benefits and other options for treatment, the patient has consented to  Procedure(s): ESOPHAGOGASTRODUODENOSCOPY (EGD) (N/A) as a surgical intervention.  The patient's history has been reviewed, patient examined, no change in status, stable for surgery.  I have reviewed the patient's chart and labs.  Questions were answered to the patient's satisfaction.     Venita Lick. Russella Dar

## 2022-07-22 NOTE — Anesthesia Procedure Notes (Signed)
Date/Time: 07/22/2022 1:31 PM  Performed by: Minerva Ends, CRNAPre-anesthesia Checklist: Patient identified, Emergency Drugs available, Suction available, Patient being monitored and Timeout performed Patient Re-evaluated:Patient Re-evaluated prior to induction Oxygen Delivery Method: Simple face mask Placement Confirmation: positive ETCO2 and breath sounds checked- equal and bilateral Dental Injury: Teeth and Oropharynx as per pre-operative assessment

## 2022-07-22 NOTE — Progress Notes (Signed)
[facility] PROGRESS NOTE    Progress Note  Brandi Stephens  ZOX:[id number] DOB: 07/20/1945 DOA: 07/18/2022 PCP: Jac Canavan, PA-C     Brief Narrative:   Brandi Stephens is an 77 y.o. female history of diabetes mellitus comes in with vomiting admitted for UTI  Assessment/Plan:   UTI (urinary tract infection) Urine culture was unrevealing. Treated empirically antibiotics, transition to oral Omnicef.  Intractable nausea and vomiting of unclear etiology: Continues to have nausea and vomiting is not able to tolerate her diet she threw up twice last night despite being on Protonix. TSH, B12, RPR negative. GI was consulted who recommended pancreatic elastase and an EGD on 07/22/2022.  Chronic kidney disease stage I: at baseline.  Hypokalemia/hypomagnesemia: Repleted orally now improved, currently potassium greater than 4 magnesium greater than 2.  Non-insulin-dependent diabetes mellitus type 2: With A1c of 5.8 CBG stable continue sliding scale.  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  Can resume at discharge.  Chronic diastolic heart failure: Appears euvolemic.  Hypothyroidism: Continue Synthroid.   DVT prophylaxis: lovenox Family Communication:none Status is: Inpatient Remains inpatient appropriate because: Acute UTI    Code Status:     Code Status Orders  (From admission, onward)           Start     Ordered   07/19/22 0006  Full code  Continuous       Question:  By:  Answer:  Consent: discussion documented in EHR   07/19/22 0006           Code Status History     Date Active Date Inactive Code Status Order ID Comments User Context   06/20/2022 2220 06/29/2022 2136 Full Code [id number]  Hillary Bow, DO Inpatient   01/18/2021 2332 01/26/2021 2020 Full Code [id number]  John Giovanni, MD ED   08/02/2019 1454 08/10/2019 1717 Full Code  [id number]  Charlton Amor, PA-C Inpatient   08/02/2019 1454 08/02/2019 1454 Full Code [id number]  Charlton Amor, PA-C Inpatient   07/11/2019 1805 08/02/2019 1426 Full Code [id number]  [name], PA-C Inpatient   07/11/2019 1311 07/11/2019 1656 Full Code [id number]  [name], PA-C Inpatient   06/28/2019 1614 07/11/2019 1310 Full Code [id number]  Charlton Amor, PA-C Inpatient   06/28/2019 1613 06/28/2019 1614 Full Code [id number]  Charlton Amor, PA-C Inpatient   06/25/2019 0637 06/28/2019 1612 Full Code [id number]  Briscoe Deutscher, MD Inpatient   05/30/2019 1926 05/31/2019 1705 Full Code [id number]  [name], PA-C Inpatient   03/01/2019 2349 03/04/2019 1658 Full Code [id number]  Cristescu, Victorino Sparrow, MD ED   06/29/2018 1820 06/30/2018 1511 Full Code [id number]  [name] Inpatient   05/15/2015 1839 05/21/2015 1843 Full Code [id number]  [name], MD Inpatient   03/20/2015 1959 03/25/2015 2204 Full Code [id number]  [name], MD Inpatient         IV Access:   Peripheral IV   Procedures and diagnostic studies:   No results found.   Medical Consultants:   None.   Subjective:    [name] Little bit anxious but ready to get this over with  Objective:    Vitals:   07/21/22 1416 07/21/22 2053 07/22/22 0427 07/22/22 0800  BP: 104/69 112/79 120/86   Pulse: 100 100 (!) 105   Resp: Temp: 98.4 F (36.9 C) 98.2  F (36.8 C) 97.7 F (36.5 C)   TempSrc: Oral Oral Oral   SpO2: (!) 88% 91% 94% 93%  Weight:      Height:       SpO2: 93 %   Intake/Output Summary (Last 24 hours) at 07/22/2022 0828 Last data filed at 07/22/2022 0700 Gross per 24 hour  Intake 990 ml  Output --  Net 990 ml    Filed Weights   07/18/22 1247 07/19/22 0000  Weight: 78.9 kg 83.6 kg    Exam: General exam: In no acute distress. Respiratory system: Good air movement and clear to auscultation. Cardiovascular system: S1 & S2 heard,  RRR. No JVD. Gastrointestinal system: Abdomen is nondistended, soft and nontender.  Extremities: No pedal edema. Skin: No rashes, lesions or ulcers Psychiatry: Judgement and insight appear normal. Mood & affect appropriate. Data Reviewed:    Labs: Basic Metabolic Panel: Recent Labs  Lab 07/18/22 1254 07/19/22 0428 07/20/22 0446  NA 144 143 142  K 3.3* 3.0* 3.9  CL 105 110 110  CO2 26 26 24   GLUCOSE 119* 83 83  BUN 23 18 12   CREATININE 1.11* 0.94 0.89  CALCIUM 10.1 9.0 9.3  MG  --  1.5* 1.8    GFR Estimated Creatinine Clearance: 60.7 mL/min (by C-G formula based on SCr of 0.89 mg/dL). Liver Function Tests: Recent Labs  Lab 07/18/22 1254 07/19/22 0428  AST 19 16  ALT 13 14  ALKPHOS 77 69  BILITOT 0.7 0.8  PROT 6.5 5.2*  ALBUMIN 3.9 3.1*    Recent Labs  Lab 07/18/22 1254 07/19/22 0428  LIPASE 96* 60*    No results for input(s): "AMMONIA" in the last 168 hours. Coagulation profile Recent Labs  Lab 07/18/22 1600  INR 1.0    COVID-19 Labs  No results for input(s): "DDIMER", "FERRITIN", "LDH", "CRP" in the last 72 hours.  Lab Results  Component Value Date   SARSCOV2NAA NEGATIVE 06/20/2022   SARSCOV2NAA NEGATIVE 01/18/2021   SARSCOV2NAA NEGATIVE 05/11/2020   SARSCOV2NAA NEGATIVE 06/24/2019    CBC: Recent Labs  Lab 07/18/22 1254 07/19/22 0428  WBC 6.8 7.4  HGB 14.3 12.6  HCT 43.5 40.1  MCV 94.4 96.9  PLT 280 236    Cardiac Enzymes: No results for input(s): "CKTOTAL", "CKMB", "CKMBINDEX", "TROPONINI" in the last 168 hours. BNP (last 3 results) No results for input(s): "PROBNP" in the last 8760 hours. CBG: Recent Labs  Lab 07/21/22 1155 07/21/22 1743 07/21/22 2050 07/21/22 2347 07/22/22 0723  GLUCAP 77 84 88 79 87    D-Dimer: No results for input(s): "DDIMER" in the last 72 hours. Hgb A1c: No results for input(s): "HGBA1C" in the last 72 hours. Lipid Profile: No results for input(s): "CHOL", "HDL", "LDLCALC", "TRIG", "CHOLHDL",  "LDLDIRECT" in the last 72 hours. Thyroid function studies: Recent Labs    07/20/22 0846  TSH 3.158    Anemia work up: Recent Labs    07/20/22 0846  VITAMINB12 823    Sepsis Labs: Recent Labs  Lab 07/18/22 1254 07/18/22 1505 07/18/22 1652 07/19/22 0428  WBC 6.8  --   --  7.4  LATICACIDVEN  --  2.1* 1.8  --     Microbiology Recent Results (from the past 240 hour(s))  Urine Culture (for pregnant, neutropenic or urologic patients or patients with an indwelling urinary catheter)     Status: None   Collection Time: 07/18/22 12:54 PM   Specimen: Urine, Clean Catch  Result Value Ref Range Status   Specimen Description  Final    URINE, CLEAN CATCH Performed at [facility], [address] [place], [place] [address]    Special Requests   Final    NONE Performed at [facility], [address] [place], [place] [address]    Culture   Final    NO GROWTH Performed at [facility], [address], [place], [place] [address]    Report Status 07/19/2022 FINAL  Final  Culture, blood (Routine x 2)     Status: None (Preliminary result)   Collection Time: 07/18/22  3:05 PM   Specimen: BLOOD RIGHT ARM  Result Value Ref Range Status   Specimen Description   Final    BLOOD RIGHT ARM Performed at [facility], [address] [place], [place] [address]    Special Requests   Final    BOTTLES DRAWN AEROBIC AND ANAEROBIC Blood Culture adequate volume Performed at [facility], [address], [place], [place] [address]    Culture   Final    NO GROWTH 3 DAYS Performed at [facility], [address], [name], [place] [address]    Report Status PENDING  Incomplete  Culture, blood (Routine x 2)     Status: None (Preliminary result)   Collection Time: 07/18/22 11:23 PM   Specimen: BLOOD  Result Value Ref Range Status   Specimen Description   Final    BLOOD BLOOD RIGHT HAND Performed at [facility], [address], [place], [place] [address]    Special Requests   Final    BOTTLES DRAWN AEROBIC AND ANAEROBIC Blood Culture adequate volume Performed at [facility], [address] [place], [place] [address]    Culture   Final    NO GROWTH 2 DAYS Performed at [facility], [address] [place], [place] [address]    Report Status PENDING  Incomplete     Medications:    allopurinol  300 mg Oral Daily   amitriptyline  100 mg Oral QHS   DULoxetine  60 mg Oral Daily   enoxaparin (LOVENOX) injection  40 mg Subcutaneous Q24H   fluticasone furoate-vilanterol  1 puff Inhalation Daily   And   umeclidinium bromide  1 puff Inhalation Daily   insulin aspart  0-5 Units Subcutaneous QHS   insulin aspart  0-9 Units Subcutaneous TID WC   levothyroxine  100 mcg Oral Daily   midodrine  5 mg Oral TID WC   pantoprazole  40 mg Oral BID   Continuous Infusions:      LOS: 4 days   Marinda Elk  [facility]  07/22/2022, 8:28 AM

## 2022-07-22 NOTE — Anesthesia Preprocedure Evaluation (Addendum)
Anesthesia Evaluation  Patient identified by MRN, date of birth, ID band Patient awake    Reviewed: Allergy & Precautions, H&P , NPO status , Patient's Chart, lab work & pertinent test results  History of Anesthesia Complications (+) PONV and history of anesthetic complications  Airway Mallampati: II  TM Distance: >3 FB Neck ROM: Full    Dental no notable dental hx. (+) Edentulous Upper, Partial Lower, Dental Advisory Given   Pulmonary COPD, former smoker   Pulmonary exam normal breath sounds clear to auscultation       Cardiovascular hypertension, Pt. on medications  Rhythm:Regular Rate:Normal     Neuro/Psych Seizures -, Well Controlled,   Anxiety Depression       GI/Hepatic Neg liver ROS,GERD  ,,  Endo/Other  diabetes, Type 2, Oral Hypoglycemic AgentsHypothyroidism    Renal/GU negative Renal ROS  negative genitourinary   Musculoskeletal  (+) Arthritis , Osteoarthritis,    Abdominal   Peds  Hematology  (+) Blood dyscrasia, anemia   Anesthesia Other Findings   Reproductive/Obstetrics negative OB ROS                             Anesthesia Physical Anesthesia Plan  ASA: 3  Anesthesia Plan: MAC   Post-op Pain Management: Minimal or no pain anticipated   Induction: Intravenous  PONV Risk Score and Plan: 3 and Propofol infusion and Treatment may vary due to age or medical condition  Airway Management Planned: Natural Airway and Nasal Cannula  Additional Equipment:   Intra-op Plan:   Post-operative Plan:   Informed Consent: I have reviewed the patients History and Physical, chart, labs and discussed the procedure including the risks, benefits and alternatives for the proposed anesthesia with the patient or authorized representative who has indicated his/her understanding and acceptance.     Dental advisory given  Plan Discussed with: CRNA  Anesthesia Plan Comments:         Anesthesia Quick Evaluation

## 2022-07-22 NOTE — Transfer of Care (Signed)
Immediate Anesthesia Transfer of Care Note  Patient: Brandi Stephens  Procedure(s) Performed: ESOPHAGOGASTRODUODENOSCOPY (EGD) BIOPSY  Patient Location: PACU and Endoscopy Unit  Anesthesia Type:MAC  Level of Consciousness: oriented, drowsy, and patient cooperative  Airway & Oxygen Therapy: Patient Spontanous Breathing and Patient connected to face mask oxygen  Post-op Assessment: Report given to RN and Post -op Vital signs reviewed and stable  Post vital signs: Reviewed and stable  Last Vitals:  Vitals Value Taken Time  BP    Temp    Pulse 101 07/22/22 1415  Resp 15 07/22/22 1415  SpO2 99 % 07/22/22 1415  Vitals shown include unvalidated device data.  Last Pain:  Vitals:   07/22/22 1411  TempSrc: Temporal  PainSc: 0-No pain      Patients Stated Pain Goal: 2 (07/21/22 2246)  Complications: No notable events documented.

## 2022-07-22 NOTE — Op Note (Signed)
[facility] Patient Name: Brandi Stephens Procedure Date: 07/22/2022 MRN: [id number] Attending MD: Meryl Dare , MD, [id number] Date of Birth: 07/19/1945 CSN: [id number] Age: 77 Admit Type: Inpatient Procedure:                Upper GI endoscopy Indications:              Gastroesophageal reflux disease, Nausea with                            vomiting, Weight loss Providers:                Venita Lick. Russella Dar, MD, Marge Duncans, RN, Beryle Beams, Technician, Marlena Clipper, CRNA Referring MD:             [facility] Medicines:                Monitored Anesthesia Care Complications:            No immediate complications. Estimated Blood Loss:     Estimated blood loss was minimal. Procedure:                Pre-Anesthesia Assessment:                           - Prior to the procedure, a History and Physical                            was performed, and patient medications and                            allergies were reviewed. The patient's tolerance of                            previous anesthesia was also reviewed. The risks                            and benefits of the procedure and the sedation                            options and risks were discussed with the patient.                            All questions were answered, and informed consent                            was obtained. Prior Anticoagulants: The patient has                            taken no anticoagulant or antiplatelet agents. ASA                            Grade Assessment: II - A patient with mild systemic  disease. After reviewing the risks and benefits,                            the patient was deemed in satisfactory condition to                            undergo the procedure.                           After obtaining informed consent, the endoscope was                            passed under direct vision. Throughout the                             procedure, the patient's blood pressure, pulse, and                            oxygen saturations were monitored continuously. The                            [id number]-[id number] ([id number] Olympus endoscope was introduced                            through the mouth, and advanced to the third part                            of duodenum. The upper GI endoscopy was                            accomplished without difficulty. The patient                            tolerated the procedure well. 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      The exam of the esophagus was otherwise normal.      Three 4 to 6 mm sessile polyps with no bleeding and no stigmata of       recent bleeding were found in the gastric body. Biopsies were taken with       a cold forceps for histology.      Diffuse mildly erythematous mucosa without bleeding was found in the       entire examined stomach. Biopsies were taken with a cold forceps for       histology.      A medium amount of food (residue) was found in the gastric fundus and in       the gastric body.      Focal scar c/w a small healed ulcer site was found in the prepyloric       region of the stomach. The scar was unremarkable in appearance.      The exam of the stomach was otherwise normal.      There was a polypoid medium-sized non-obstructing lipoma, 10 mm x 15 mm,  in the second portion of the duodenum. Biopsies were taken with a cold       forceps for histology.      The exam of the duodenum was otherwise normal. Impression:               - Ectopic gastric mucosa in the upper third of the                            esophagus.                           - Focal scar c/w small healed ulcer site was found                            in the prepyloric region of the stomach.                           - Three gastric body polyps. Biopsied.                           -  Erythematous mucosa in the stomach. Biopsied.                           - A medium amount of food (residue) in the stomach                            that obscured a portion of the stomach.                           - Duodenal lipoma. Biopsied. Moderate Sedation:      Not Applicable - Patient had care per Anesthesia. Recommendation:           - Return patient to hospital ward for ongoing care.                           - Soft, low residue, ADA diet.                           - Follow antireflux measures long term.                           - Continue present medications.                           - Remain off semaglutide                           - Continue pantoprazole 40 mg po bid until GI                            follow up.                           - Zofran 4 mg po ac and hs for 5 days then prn  until GI follow up.                           - Minimize/avoid NSAIDs.                           - Await pathology results.                           - OK for discharge tomorrow from GI standpoint if                            symptoms are controlled, diet tolerated.                           - Return to GI office in 1 month with Dr.                            Adela Lank. Procedure Code(s):        --- Professional ---                           [phone], Esophagogastroduodenoscopy, flexible,                            transoral; with biopsy, single or multiple Diagnosis Code(s):        --- Professional ---                           Q40.2, Other specified congenital malformations of                            stomach                           K31.7, Polyp of stomach and duodenum                           K31.89, Other diseases of stomach and duodenum                           D17.5, Benign lipomatous neoplasm of                            intra-abdominal organs                           K21.9, Gastro-esophageal reflux disease without                             esophagitis                           R11.2, Nausea with vomiting, unspecified                           R63.4, Abnormal weight loss CPT copyright 2022 American [employer] Association. All rights reserved. The codes documented in this  report are preliminary and upon coder review may  be revised to meet current compliance requirements. Meryl Dare, MD 07/22/2022 2:15:18 PM This report has been signed electronically. Number of Addenda: 0

## 2022-07-22 NOTE — Care Management Important Message (Signed)
Important Message  Patient Details IM Letter given. Name: BRANDALYNN BRUGGER MRN: [id number] Date of Birth: 04/26/1945   Medicare Important Message Given:  Yes     Caren Macadam 07/22/2022, 12:12 PM

## 2022-07-22 NOTE — Anesthesia Postprocedure Evaluation (Signed)
Anesthesia Post Note  Patient: Brandi Stephens  Procedure(s) Performed: ESOPHAGOGASTRODUODENOSCOPY (EGD) BIOPSY     Patient location during evaluation: Endoscopy Anesthesia Type: MAC Level of consciousness: awake and alert Pain management: pain level controlled Vital Signs Assessment: post-procedure vital signs reviewed and stable Respiratory status: spontaneous breathing, nonlabored ventilation and respiratory function stable Cardiovascular status: stable and blood pressure returned to baseline Postop Assessment: no apparent nausea or vomiting Anesthetic complications: no  No notable events documented.  Last Vitals:  Vitals:   07/22/22 1425 07/22/22 1430  BP: (!) 106/48 127/67  Pulse: 95 99  Resp: 14 16  Temp:    SpO2: 97% 92%    Last Pain:  Vitals:   07/22/22 1430  TempSrc:   PainSc: 0-No pain                 Tyden Kann,W. EDMOND

## 2022-07-23 ENCOUNTER — Inpatient Hospital Stay (HOSPITAL_COMMUNITY): Payer: Medicare HMO

## 2022-07-23 DIAGNOSIS — E872 Acidosis, unspecified: Secondary | ICD-10-CM | POA: Diagnosis not present

## 2022-07-23 DIAGNOSIS — W19XXXA Unspecified fall, initial encounter: Secondary | ICD-10-CM

## 2022-07-23 DIAGNOSIS — N3001 Acute cystitis with hematuria: Secondary | ICD-10-CM | POA: Diagnosis not present

## 2022-07-23 DIAGNOSIS — N3000 Acute cystitis without hematuria: Secondary | ICD-10-CM | POA: Diagnosis not present

## 2022-07-23 DIAGNOSIS — K219 Gastro-esophageal reflux disease without esophagitis: Secondary | ICD-10-CM | POA: Diagnosis not present

## 2022-07-23 DIAGNOSIS — Y92009 Unspecified place in unspecified non-institutional (private) residence as the place of occurrence of the external cause: Secondary | ICD-10-CM

## 2022-07-23 DIAGNOSIS — K2900 Acute gastritis without bleeding: Secondary | ICD-10-CM

## 2022-07-23 LAB — BASIC METABOLIC PANEL
Anion gap: 6 (ref 5–15)
BUN: 9 mg/dL (ref 8–23)
CO2: 23 mmol/L (ref 22–32)
Calcium: 8.9 mg/dL (ref 8.9–10.3)
Chloride: 108 mmol/L (ref 98–111)
Creatinine, Ser: 0.87 mg/dL (ref 0.44–1.00)
GFR, Estimated: >60 mL/min (ref >60)
Glucose, Bld: 97 mg/dL (ref 70–99)
Potassium: 4.1 mmol/L (ref 3.5–5.1)
Sodium: 137 mmol/L (ref 135–145)

## 2022-07-23 LAB — CULTURE, BLOOD (ROUTINE X 2)
Culture: NO GROWTH
Special Requests: ADEQUATE

## 2022-07-23 LAB — GLUCOSE, CAPILLARY
Glucose-Capillary: 100 mg/dL — ABNORMAL HIGH (ref 70–99)
Glucose-Capillary: 84 mg/dL (ref 70–99)
Glucose-Capillary: 96 mg/dL (ref 70–99)
Glucose-Capillary: 92 mg/dL (ref 70–99)

## 2022-07-23 LAB — MAGNESIUM: Magnesium: 1.9 mg/dL (ref 1.7–2.4)

## 2022-07-23 MED ORDER — SODIUM CHLORIDE 0.9 % IV SOLN: INTRAVENOUS | Status: DC

## 2022-07-23 MED ORDER — MIDODRINE HCL 5 MG PO TABS
10.0000 mg | ORAL_TABLET | Freq: Three times a day (TID) | ORAL | Status: DC
  Administered 2022-07-23 – 2022-07-24 (×4): 10 mg via ORAL
  Filled 2022-07-23 (×4): qty 2

## 2022-07-23 MED ORDER — PANTOPRAZOLE SODIUM 40 MG PO TBEC: 40.0000 mg | DELAYED_RELEASE_TABLET | Freq: Two times a day (BID) | ORAL | 1 refills | Status: DC

## 2022-07-23 NOTE — Progress Notes (Signed)
   07/23/22 1021  What Happened  Was fall witnessed? No  Was patient injured? Yes  Patient found in bathroom  Found by Staff-comment Atha Starks, RN)  Stated prior activity bathroom-unassisted  Provider Notification  Provider Name/Title Dr. Radonna Ricker  Date Provider Notified 07/23/22  Time Provider Notified 1026  Method of Notification Page  Notification Reason Fall  Provider response At bedside (Dr. Carmell Austria arrived per request form Dr. Robb Matar)  Date of Provider Response 07/23/22  Time of Provider Response 1028  Follow Up  Family notified Yes - comment (family notified by patient)  Time family notified 1132  Additional tests Yes-comment  Simple treatment Ice  Progress note created (see row info) Yes  Blank note created Yes  Adult Fall Risk Assessment  Risk Factor Category (scoring not indicated) Fall has occurred during this admission (document High fall risk)  Patient Fall Risk Level High fall risk  Adult Fall Risk Interventions  Required Bundle Interventions *See Row Information* High fall risk - low, moderate, and high requirements implemented  Additional Interventions Room near nurses station;Use of appropriate toileting equipment (bedpan, BSC, etc.);Assess orthostatic BP  Screening for Fall Injury Risk (To be completed on HIGH fall risk patients) - Assessing Need for Floor Mats  Risk For Fall Injury- Criteria for Floor Mats Previous fall this admission  Will Implement Floor Mats Yes  Vitals  Temp 97.8 F (36.6 C)  Temp Source Oral  BP 128/76  MAP (mmHg) 92  BP Location Right Arm  BP Method Automatic  Patient Position (if appropriate) Lying  Pulse Rate (!) 107  Pulse Rate Source Monitor  Resp 15  Oxygen Therapy  SpO2 95 %  O2 Device Room Air  Pain Assessment  Pain Scale 0-10  Pain Score 6  Pain Type Acute pain  Pain Location Head  Pain Orientation Posterior  Pain Descriptors / Indicators Sore  Pain Frequency Constant  Pain Onset Progressive  Patients Stated  Pain Goal 2  Pain Intervention(s) MD notified (Comment);Hot/Cold interventions  Multiple Pain Sites No  Hot/Cold Interventions  Hot/Cold Interventions Ice Pack  PCA/Epidural/Spinal Assessment  Respiratory Pattern Regular;Unlabored  Neurological  Neuro (WDL) WDL  Musculoskeletal  Musculoskeletal (WDL) X  Assistive Device Front wheel walker  Generalized Weakness Yes  Weight Bearing Restrictions No  Musculoskeletal Details  RUE Full movement  LUE Full movement  RLE Full movement  LLE Full movement  Lower Back Limited movement  Integumentary  Integumentary (WDL) X  Ecchymosis Location Head (posterior)  Ecchymosis Location Orientation Left  Pain Assessment  Date Pain First Started 07/23/22  Result of Injury Yes    Patient pulled bathroom cord - upon entry into restroom patient found on her knees. Assisted patient back to the bed - completed neuro assessment. A&O x4 - answers orientation question appropriately, no deficits. Egg sized lump on back of head on left side.    BG check on Dexcom - 90  POC BG checked - 84   Patient previously independently ambulates to restroom without assistance - thus no bed alarm was placed on the patient. Pt did not have socks on, took them off due to neuropathy.  Discharge orders were placed this morning for patient to go home.    Patient states she was in the restroom when se suddenly felt dizzy and lost consciousness for a few seconds. Hit her head on shower.    Dr. Sunnie Nielsen at bedside to assess patient - stat head CT ordered

## 2022-07-23 NOTE — Progress Notes (Signed)
Patient pulled bathroom cord - upon entry into restroom patient found on her knees. Assisted patient back to the bed - competed neuro assessment. A&O x4 - answers orientation question appropriately, no deficits. Egg sized lump on back of head on left side.   BG check on Dexcom - 90  POC BG checked - 84  Patient previously independently ambulates to restroom without assistance - thus no bed alarm was placed on the patient. Discharge orders were placed this morning for patient to go home.   Patient states she was in the restroom when se suddenly felt dizzy and lost consciousness for a few seconds. Hit her head on shower.   Dr. Sunnie Nielsen at bedside to assess patient - stat head CT ordered

## 2022-07-23 NOTE — Discharge Summary (Signed)
Physician Discharge Summary  Brandi Stephens DOB: 1945-10-26 DOA: 07/18/2022  PCP: Jac Canavan, PA-C  Admit date: 07/18/2022 Discharge date: 07/23/2022  Admitted From: Home Disposition:  home  Recommendations for Outpatient Follow-up:  Follow up with gastroenterology in 1-2 weeks Please obtain BMP/CBC in one week   Home Health:No Equipment/Devices:None  Discharge Condition:Stable CODE STATUS:fULL Diet recommendation: Heart Healthy  Brief/Interim Summary: 77 y.o. female history of diabetes mellitus comes in with vomiting admitted for UTI   Discharge Diagnoses:  Principal Problem:   UTI (urinary tract infection) Active Problems:   Hypothyroidism   Nausea and vomiting   Osteomyelitis of lumbar spine   Chronic diastolic CHF (congestive heart failure)   Hypokalemia   Type 2 diabetes mellitus   Loss of weight   Gastroesophageal reflux disease  Intractable nausea vomiting likely due to gastritis: Started on Protonix IV twice daily she continued to vomit despite treatment. GI was consulted to perform an EGD on 07/22/2022 that showed ectopic gastric mucosa focal scarring and a small heel ulcer in the prepyloric area 3 gastric polyps biopsies were taken we will follow-up results with GI as an outpatient. She continue Protonix p.o. twice daily will continue soft diet as an outpatient.  Probable UTI: Will treat empirically with oral Omnicef.  Chronic kidney disease stage I: Creatinine at baseline.  Hypokalemia/hypomagnesemia: Replete orally now resolved.  Non-insulin-dependent diabetes mellitus type 2: With an A1c of 5.8 CBG stable, GI recommended to hold on semaglutide until I see her as a follow-up.  History of chronic osteomyelitis of the lumbar spine with MSSA bacteremia: Follow-up with infectious disease as an outpatient continue cefadroxil as an outpatient.  Chronic diastolic heart failure: No changes made appears euvolemic.  Hypothyroidism:  Continue Synthroid.    Discharge Instructions  Discharge Instructions     Diet - low sodium heart healthy   Complete by: As directed    Increase activity slowly   Complete by: As directed       Allergies as of 07/23/2022       Reactions   Ampicillin Hives, Other (See Comments)   Severe reaction in February 2017 1.5 month to have hives to go away   Gabapentin Swelling   UNSPECIFIED REACTION    Penicillins Hives, Other (See Comments)   Severe reaction in February 2017 1.5 month to have hives to go away Has patient had a PCN reaction causing immediate rash, facial/tongue/throat swelling, SOB or lightheadedness with hypotension: #  #  #  YES  #  #  #  Has patient had a PCN reaction causing severe rash involving mucus membranes or skin necrosis: No Has patient had a PCN reaction that required hospitalization No Has patient had a PCN reaction occurring within the last 10 years: No   Other Itching   UNSPECIFIED Analgesics        Medication List     STOP taking these medications    cefadroxil 500 MG capsule Commonly known as: DURICEF   PROBIOTIC DAILY PO       TAKE these medications    allopurinol 300 MG tablet Commonly known as: ZYLOPRIM Take 300 mg by mouth daily.   amitriptyline 100 MG tablet Commonly known as: ELAVIL Take 1 tablet (100 mg total) by mouth at bedtime.   atorvastatin 80 MG tablet Commonly known as: LIPITOR Take 1 tablet (80 mg total) by mouth daily.   Biotin 3 MG Tabs Take 3 mg by mouth daily.   colchicine 0.6 MG tablet Take  2 tablets at once then take 1 tablet an hour later then then 1 tablet twice daily x 3 days.   diazepam 10 MG tablet Commonly known as: VALIUM Take 1 tablet (10 mg total) by mouth daily as needed for anxiety.   DULoxetine 60 MG capsule Commonly known as: CYMBALTA TAKE 1 CAPSULE(60 MG) BY MOUTH DAILY What changed:  how much to take how to take this when to take this additional instructions   FreeStyle Libre 3  Sensor Misc Place 1 sensor on the skin every 14 days. Use to check glucose continuously   HYDROcodone-acetaminophen 5-325 MG tablet Commonly known as: NORCO/VICODIN Take 1 tablet by mouth every 4 (four) hours as needed for moderate pain.   levothyroxine 100 MCG tablet Commonly known as: Synthroid Take 1 tablet (100 mcg total) by mouth daily.   Magnesium Gluconate 250 MG Tabs Take 1 tablet by mouth daily.   midodrine 10 MG tablet Commonly known as: PROAMATINE Take 1 tablet (10 mg total) by mouth 3 (three) times daily with meals.   OXYGEN Inhale 2 L into the lungs.   pantoprazole 40 MG tablet Commonly known as: PROTONIX Take 1 tablet (40 mg total) by mouth 2 (two) times daily. What changed: when to take this   polyethylene glycol powder 17 GM/SCOOP powder Commonly known as: GLYCOLAX/MIRALAX Take 17 g by mouth daily as needed for moderate constipation.   primidone 50 MG tablet Commonly known as: MYSOLINE TAKE 1 TABLET BY MOUTH TWICE DAILY   rOPINIRole 1 MG tablet Commonly known as: REQUIP TAKE 1 TABLET(1 MG) BY MOUTH AT BEDTIME What changed:  how much to take how to take this when to take this additional instructions   Semaglutide (1 MG/DOSE) 4 MG/3ML Sopn Inject 1 mg as directed once a week.   SYSTANE BALANCE OP Place 1 drop into both eyes 4 (four) times daily as needed (dry eyes).   topiramate 25 MG tablet Commonly known as: Topamax Take 3 tablets (75 mg total) by mouth at bedtime.   [name] Ellipta 100-62.5-25 MCG/ACT Aepb Generic drug: Fluticasone-Umeclidin-Vilant Inhale 1 puff into the lungs daily.   Vitamin D3 50 MCG (2000 UT) Caps Generic drug: Cholecalciferol Take 1 capsule (2,000 Units total) by mouth daily.        Follow-up Information     Meryl Dare, MD Follow up in 2 week(s).   Specialty: Gastroenterology Contact information: [address] [place] [address] [phone]                Allergies  Allergen  Reactions   Ampicillin Hives and Other (See Comments)    Severe reaction in February 2017 1.5 month to have hives to go away   Gabapentin Swelling    UNSPECIFIED REACTION    Penicillins Hives and Other (See Comments)    Severe reaction in February 2017 1.5 month to have hives to go away Has patient had a PCN reaction causing immediate rash, facial/tongue/throat swelling, SOB or lightheadedness with hypotension: #  #  #  YES  #  #  #  Has patient had a PCN reaction causing severe rash involving mucus membranes or skin necrosis: No Has patient had a PCN reaction that required hospitalization No Has patient had a PCN reaction occurring within the last 10 years: No    Other Itching    UNSPECIFIED Analgesics    Consultations: Gastroente allergy   Procedures/Studies: DG Chest 2 View  Result Date: 07/18/2022 CLINICAL DATA:  Possible sepsis EXAM:  CHEST - 2 VIEW COMPARISON:  06/20/2022 FINDINGS: Transverse diameter of heart is in upper limits of normal. There are no signs of pulmonary edema or focal pulmonary consolidation. There is no pleural effusion or pneumothorax. Surgical clips are seen in left chest wall. IMPRESSION: There are no signs of pulmonary edema or focal pulmonary consolidation. Electronically Signed   By: Ernie Avena M.D.   On: 07/18/2022 15:41   CARDIAC EVENT MONITOR  Result Date: 07/07/2022 1 triggered event that was automatic for sinus tachycardia. No significant tachyarrhythmia or bradyarrhythmia. No atrial fibrillation or flutter.  ECHOCARDIOGRAM COMPLETE  Result Date: 06/27/2022    ECHOCARDIOGRAM REPORT   Patient Name:   Brandi Stephens Date of Exam: 06/27/2022 Medical Rec #:  [id number]        Height:       67.5 in Accession #:    [id number]       Weight:       191.0 lb Date of Birth:  Aug 22, 1945        BSA:          1.994 m Patient Age:    76 years         BP:           100/61 mmHg Patient Gender: F                HR:           93 bpm. Exam Location:  Inpatient  Procedure: 2D Echo, Cardiac Doppler and Color Doppler Indications:    syncope  History:        Patient has prior history of Echocardiogram examinations. CHF,                 COPD; Risk Factors:Hypertension and Dyslipidemia.  Sonographer:    Mike Gip Referring Phys: [id number] Dorothe Pea BRANCH IMPRESSIONS  1. Left ventricular ejection fraction, by estimation, is 60 to 65%. The left ventricle has normal function. The left ventricle has no regional wall motion abnormalities. There is moderate left ventricular hypertrophy. Left ventricular diastolic parameters are consistent with Grade I diastolic dysfunction (impaired relaxation).  2. Right ventricular systolic function is normal. The right ventricular size is normal. There is normal pulmonary artery systolic pressure.  3. The mitral valve is normal in structure. No evidence of mitral valve regurgitation. No evidence of mitral stenosis.  4. The aortic valve was not well visualized. Aortic valve regurgitation is not visualized. No aortic stenosis is present.  5. Aortic dilatation noted. There is borderline dilatation of the aortic root, measuring 39 mm. There is mild dilatation of the ascending aorta, measuring 40 mm.  6. The inferior vena cava is normal in size with <50% respiratory variability, suggesting right atrial pressure of 8 mmHg. FINDINGS  Left Ventricle: Left ventricular ejection fraction, by estimation, is 60 to 65%. The left ventricle has normal function. The left ventricle has no regional wall motion abnormalities. The left ventricular internal cavity size was normal in size. There is  moderate left ventricular hypertrophy. Left ventricular diastolic parameters are consistent with Grade I diastolic dysfunction (impaired relaxation). Right Ventricle: The right ventricular size is normal. No increase in right ventricular wall thickness. Right ventricular systolic function is normal. There is normal pulmonary artery systolic pressure. The tricuspid  regurgitant velocity is 2.35 m/s, and  with an assumed right atrial pressure of 3 mmHg, the estimated right ventricular systolic pressure is 25.1 mmHg. Left Atrium: Left atrial size was normal in size. Right Atrium: Right  atrial size was normal in size. Pericardium: There is no evidence of pericardial effusion. Mitral Valve: The mitral valve is normal in structure. No evidence of mitral valve regurgitation. No evidence of mitral valve stenosis. Tricuspid Valve: The tricuspid valve is normal in structure. Tricuspid valve regurgitation is trivial. No evidence of tricuspid stenosis. Aortic Valve: The aortic valve was not well visualized. Aortic valve regurgitation is not visualized. No aortic stenosis is present. Pulmonic Valve: The pulmonic valve was normal in structure. Pulmonic valve regurgitation is not visualized. No evidence of pulmonic stenosis. Aorta: Aortic dilatation noted. There is borderline dilatation of the aortic root, measuring 39 mm. There is mild dilatation of the ascending aorta, measuring 40 mm. Venous: The inferior vena cava is normal in size with less than 50% respiratory variability, suggesting right atrial pressure of 8 mmHg. IAS/Shunts: No atrial level shunt detected by color flow Doppler.  LEFT VENTRICLE PLAX 2D LVIDd:         4.90 cm     Diastology LVIDs:         3.65 cm     LV e' medial:    6.85 cm/s LV PW:         1.80 cm     LV E/e' medial:  8.5 LV IVS:        1.60 cm     LV e' lateral:   7.29 cm/s LVOT diam:     2.10 cm     LV E/e' lateral: 8.0 LV SV:         71 LV SV Index:   36 LVOT Area:     3.46 cm  LV Volumes (MOD) LV vol d, MOD A2C: 87.6 ml LV vol d, MOD A4C: 95.4 ml LV vol s, MOD A2C: 34.4 ml LV vol s, MOD A4C: 41.0 ml LV SV MOD A2C:     53.2 ml LV SV MOD A4C:     95.4 ml LV SV MOD BP:      55.5 ml RIGHT VENTRICLE             IVC RV Basal diam:  4.00 cm     IVC diam: 1.90 cm RV S prime:     11.30 cm/s TAPSE (M-mode): 1.7 cm LEFT ATRIUM             Index        RIGHT ATRIUM            Index LA diam:        3.70 cm 1.86 cm/m   RA Area:     18.20 cm LA Vol (A2C):   54.4 ml 27.28 ml/m  RA Volume:   53.00 ml  26.58 ml/m LA Vol (A4C):   45.8 ml 22.97 ml/m LA Biplane Vol: 53.3 ml 26.73 ml/m  AORTIC VALVE LVOT Vmax:   104.00 cm/s LVOT Vmean:  63.000 cm/s LVOT VTI:    0.205 m  AORTA Ao Root diam: 3.90 cm Ao Asc diam:  4.00 cm MITRAL VALVE               TRICUSPID VALVE MV Area (PHT): 4.17 cm    TR Peak grad:   22.1 mmHg MV Decel Time: 182 msec    TR Vmax:        235.00 cm/s MV E velocity: 58.30 cm/s MV A velocity: 87.80 cm/s  SHUNTS MV E/A ratio:  0.66        Systemic VTI:  0.20 m  Systemic Diam: 2.10 cm Arvilla Meres MD Electronically signed by Arvilla Meres MD Signature Date/Time: 06/27/2022/3:46:58 PM    Final    VAS US CAROTID  Result Date: 06/27/2022 Carotid Arterial Duplex Study Patient Name:  Brandi Stephens  Date of Exam:   06/27/2022 Medical Rec #: [id number]         Accession #:    [id number] Date of Birth: 03-26-1946         Patient Gender: F Patient Age:   43 years Exam Location:  [facility] Procedure:      VAS US CAROTID Referring Phys: Hartley Barefoot --------------------------------------------------------------------------------  Indications:      Bilateral bruits and Syncope. Risk Factors:     Hypertension, hyperlipidemia, Diabetes, past history of                   smoking. Other Factors:    CHF. Comparison Study: No previous exams Performing Technologist: Jody Hill RVT, RDMS  Examination Guidelines: A complete evaluation includes B-mode imaging, spectral Doppler, color Doppler, and power Doppler as needed of all accessible portions of each vessel. Bilateral testing is considered an integral part of a complete examination. Limited examinations for reoccurring indications may be performed as noted.  Right Carotid Findings: +----------+--------+--------+--------+------------------+------------------+           PSV cm/sEDV  cm/sStenosisPlaque DescriptionComments           +----------+--------+--------+--------+------------------+------------------+ CCA Prox  80      13                                intimal thickening +----------+--------+--------+--------+------------------+------------------+ CCA Distal67      21                                intimal thickening +----------+--------+--------+--------+------------------+------------------+ ICA Prox  66      19              calcific                             +----------+--------+--------+--------+------------------+------------------+ ICA Distal48      21                                                   +----------+--------+--------+--------+------------------+------------------+ ECA       77      11                                                   +----------+--------+--------+--------+------------------+------------------+ +----------+--------+-------+----------------+-------------------+           PSV cm/sEDV cmsDescribe        Arm Pressure (mmHG) +----------+--------+-------+----------------+-------------------+ [id number]            Multiphasic, WNL                    +----------+--------+-------+----------------+-------------------+ +---------+--------+--+--------+-+---------+ VertebralPSV cm/s28EDV cm/s9Antegrade +---------+--------+--+--------+-+---------+  Left Carotid Findings: +----------+--------+--------+--------+------------------+------------------+           PSV cm/sEDV cm/sStenosisPlaque DescriptionComments           +----------+--------+--------+--------+------------------+------------------+  CCA Prox  72      17                                intimal thickening +----------+--------+--------+--------+------------------+------------------+ CCA Distal60      19                                intimal thickening  +----------+--------+--------+--------+------------------+------------------+ ICA Prox  61      27                                tortuous           +----------+--------+--------+--------+------------------+------------------+ ICA Distal54      19                                                   +----------+--------+--------+--------+------------------+------------------+ ECA       76      11                                                   +----------+--------+--------+--------+------------------+------------------+ +----------+--------+--------+----------------+-------------------+           PSV cm/sEDV cm/sDescribe        Arm Pressure (mmHG) +----------+--------+--------+----------------+-------------------+ [id number]             Multiphasic, WNL                    +----------+--------+--------+----------------+-------------------+ +---------+--------+--+--------+--+---------+ VertebralPSV cm/s43EDV cm/s17Antegrade +---------+--------+--+--------+--+---------+   Summary: Right Carotid: The extracranial vessels were near-normal with only minimal wall                thickening or plaque. Left Carotid: The extracranial vessels were near-normal with only minimal wall               thickening or plaque. Vertebrals:  Bilateral vertebral arteries demonstrate antegrade flow. Subclavians: Normal flow hemodynamics were seen in bilateral subclavian              arteries. *See table(s) above for measurements and observations.  Electronically signed by Delia Heady MD on 06/27/2022 at 11:13:08 AM.    Final    CT LUMBAR SPINE W CONTRAST  Result Date: 06/23/2022 CLINICAL DATA:  Chronic low back pain with sepsis EXAM: CT LUMBAR SPINE WITH CONTRAST TECHNIQUE: Multidetector CT imaging of the lumbar spine was performed with intravenous contrast administration. RADIATION DOSE REDUCTION: This exam was performed according to the departmental dose-optimization program which includes  automated exposure control, adjustment of the mA and/or kV according to patient size and/or use of iterative reconstruction technique. CONTRAST:  80mL OMNIPAQUE IOHEXOL 300 MG/ML  SOLN COMPARISON:  No prior dedicated CT of the lumbar spine, correlation is made with 06/20/2022 CT abdomen pelvis and 08/20/2021 MRI lumbar spine FINDINGS: Segmentation: 5 lumbar type vertebral bodies. Alignment: 5 mm retrolisthesis of L1 on L2, unchanged. 5 mm anterolisthesis of L2 on L3, unchanged. Vertebrae: Redemonstrated compression deformity of T12, with overall unchanged vertebral body height loss compared to the 08/20/2021 MRI, when accounting for differences in technique. Status  post posterior fusion and decompression L1-S1, with significant lucency about the bilateral L1 screws (series 3, image 45). No other perihardware lucency. Beam hardening artifact from the hardware limits evaluation of these levels. Paraspinal and other soft tissues: Multiple renal cysts, for which no follow-up is currently indicated. Aortic atherosclerosis. Disc levels: T12-L1: Mild disc bulge. Mild facet arthropathy. No spinal canal stenosis or neural foraminal narrowing. L1-L2: Retrolisthesis. Status post posterior fusion. Moderate facet arthropathy. No spinal canal stenosis. Moderate bilateral neural foraminal narrowing. L2-L3: Grade 1 anterolisthesis. Status post fusion and decompression. No spinal canal stenosis or neural foraminal narrowing. L3-L4: Status post fusion and decompression. Significant beam hardening artifact limits evaluation of this level. Right eccentric osteophyte formation likely narrows the right lateral recess. No definite spinal canal stenosis. Mild right neural foraminal narrowing. L4-L5: Status post fusion and decompression. Significant beam hardening artifact limits evaluation of this level. No definite spinal canal stenosis or neural foraminal narrowing. L5-S1: Status post fusion and decompression. Significant beam hardening  artifact limits evaluation of this level. No spinal canal stenosis or neural foraminal narrowing. IMPRESSION: 1. Status post posterior fusion and decompression L1-S1, with significant lucency about the bilateral L1 screws, concerning for loosening. Beam hardening artifact from the hardware limits evaluation of these levels, however no definite spinal canal stenosis is seen. 2. Redemonstrated compression deformity of T12, with likely similar vertebral body height loss compared to the 08/20/2021 MRI, when accounting for differences in technique. 3. Right eccentric osteophyte formation at L3-L4 likely narrows the right lateral recess. Mild right neural foraminal narrowing. 4. Moderate bilateral neural foraminal narrowing at L1-L2. 5. Aortic atherosclerosis. Aortic Atherosclerosis (ICD10-I70.0). Electronically Signed   By: Wiliam Ke M.D.   On: 06/23/2022 20:19   (Echo, Carotid, EGD, Colonoscopy, ERCP)    Subjective: No plaints tolerating her diet.  Discharge Exam: Vitals:   07/23/22 0517 07/23/22 0831  BP: 105/67 115/79  Pulse: 94 96  Resp: 18 18  Temp: 97.8 F (36.6 C) 98 F (36.7 C)  SpO2: 97%    Vitals:   07/22/22 1832 07/22/22 2103 07/23/22 0517 07/23/22 0831  BP: 112/67 107/70 105/67 115/79  Pulse:  (!) 105 94 96  Resp:  18 18 18   Temp:  98.1 F (36.7 C) 97.8 F (36.6 C) 98 F (36.7 C)  TempSrc:  Oral Oral Axillary  SpO2:  91% 97%   Weight:      Height:        General: Pt is alert, awake, not in acute distress Cardiovascular: RRR, S1/S2 +, no rubs, no gallops Respiratory: CTA bilaterally, no wheezing, no rhonchi Abdominal: Soft, NT, ND, bowel sounds + Extremities: no edema, no cyanosis    The results of significant diagnostics from this hospitalization (including imaging, microbiology, ancillary and laboratory) are listed below for reference.     Microbiology: Recent Results (from the past 240 hour(s))  Urine Culture (for pregnant, neutropenic or urologic patients  or patients with an indwelling urinary catheter)     Status: None   Collection Time: 07/18/22 12:54 PM   Specimen: Urine, Clean Catch  Result Value Ref Range Status   Specimen Description   Final    URINE, CLEAN CATCH Performed at [facility], [address], [place], [place] [address]    Special Requests   Final    NONE Performed at [facility], [address] [place], [place] [address]    Culture   Final    NO GROWTH Performed at [facility], [address] [place], [place] [address]    Report Status 07/19/2022 FINAL  Final  Culture, blood (Routine x 2)     Status: None (Preliminary result)   Collection Time: 07/18/22  3:05 PM   Specimen: BLOOD RIGHT ARM  Result Value Ref Range Status   Specimen Description   Final    BLOOD RIGHT ARM Performed at [facility], [address] [place], [place] [address]    Special Requests   Final    BOTTLES DRAWN AEROBIC AND ANAEROBIC Blood Culture adequate volume Performed at [facility], [address] [place], [place] [address]    Culture   Final    NO GROWTH 4 DAYS Performed at [facility], [address] [place], [place] [address]    Report Status PENDING  Incomplete  Culture, blood (Routine x 2)     Status: None (Preliminary result)   Collection Time: 07/18/22 11:23 PM   Specimen: BLOOD  Result Value Ref Range Status   Specimen Description   Final    BLOOD BLOOD RIGHT HAND Performed at [facility], [address], [place], [place] [address]    Special Requests   Final    BOTTLES DRAWN AEROBIC AND ANAEROBIC Blood Culture adequate volume Performed at [facility], [address] [place], [place] [address]    Culture   Final    NO GROWTH 3 DAYS Performed at [facility], [address], [place], [place] [address]    Report Status PENDING  Incomplete     Labs: BNP (last 3 results) Recent Labs     04/07/22 0936 07/19/22 0428  BNP 55.2 122.2*   Basic Metabolic Panel: Recent Labs  Lab 07/18/22 1254 07/19/22 0428 07/20/22 0446  NA 144 143 142  K 3.3* 3.0* 3.9  CL 105 110 110  CO2 26 26 24   GLUCOSE 119* 83 83  BUN 23 18 12   CREATININE 1.11* 0.94 0.89  CALCIUM 10.1 9.0 9.3  MG  --  1.5* 1.8   Liver Function Tests: Recent Labs  Lab 07/18/22 1254 07/19/22 0428  AST 19 16  ALT 13 14  ALKPHOS 77 69  BILITOT 0.7 0.8  PROT 6.5 5.2*  ALBUMIN 3.9 3.1*   Recent Labs  Lab 07/18/22 1254 07/19/22 0428  LIPASE 96* 60*   No results for input(s): "AMMONIA" in the last 168 hours. CBC: Recent Labs  Lab 07/18/22 1254 07/19/22 0428  WBC 6.8 7.4  HGB 14.3 12.6  HCT 43.5 40.1  MCV 94.4 96.9  PLT 280 236   Cardiac Enzymes: No results for input(s): "CKTOTAL", "CKMB", "CKMBINDEX", "TROPONINI" in the last 168 hours. BNP: Invalid input(s): "POCBNP" CBG: Recent Labs  Lab 07/22/22 0723 07/22/22 1220 07/22/22 1747 07/22/22 2057 07/23/22 0751  GLUCAP 87 92 104* 102* 100*   D-Dimer No results for input(s): "DDIMER" in the last 72 hours. Hgb A1c No results for input(s): "HGBA1C" in the last 72 hours. Lipid Profile No results for input(s): "CHOL", "HDL", "LDLCALC", "TRIG", "CHOLHDL", "LDLDIRECT" in the last 72 hours. Thyroid function studies No results for input(s): "TSH", "T4TOTAL", "T3FREE", "THYROIDAB" in the last 72 hours.  Invalid input(s): "FREET3" Anemia work up No results for input(s): "VITAMINB12", "FOLATE", "FERRITIN", "TIBC", "IRON", "RETICCTPCT" in the last 72 hours. Urinalysis    Component Value Date/Time   COLORURINE YELLOW 07/18/2022 1254   APPEARANCEUR CLOUDY (A) 07/18/2022 1254   LABSPEC 1.041 (H) 07/18/2022 1254   LABSPEC 1.025 06/24/2019  1426   PHURINE 5.5 07/18/2022 1254   GLUCOSEU NEGATIVE 07/18/2022 1254   HGBUR SMALL (A) 07/18/2022 1254   BILIRUBINUR NEGATIVE 07/18/2022 1254   BILIRUBINUR moderate (A) 06/24/2019 1426   BILIRUBINUR n  11/25/2015 1712   KETONESUR NEGATIVE 07/18/2022 1254   PROTEINUR 30 (A) 07/18/2022 1254   UROBILINOGEN negative 11/25/2015 1712   NITRITE NEGATIVE 07/18/2022 1254   LEUKOCYTESUR LARGE (A) 07/18/2022 1254   Sepsis Labs Recent Labs  Lab 07/18/22 1254 07/19/22 0428  WBC 6.8 7.4   Microbiology Recent Results (from the past 240 hour(s))  Urine Culture (for pregnant, neutropenic or urologic patients or patients with an indwelling urinary catheter)     Status: None   Collection Time: 07/18/22 12:54 PM   Specimen: Urine, Clean Catch  Result Value Ref Range Status   Specimen Description   Final    URINE, CLEAN CATCH Performed at [facility], [address], [place], [place] [address]    Special Requests   Final    NONE Performed at [facility], [address], [place], [place] [address]    Culture   Final    NO GROWTH Performed at [facility], [address] [place], [place] [address]    Report Status 07/19/2022 FINAL  Final  Culture, blood (Routine x 2)     Status: None (Preliminary result)   Collection Time: 07/18/22  3:05 PM   Specimen: BLOOD RIGHT ARM  Result Value Ref Range Status   Specimen Description   Final    BLOOD RIGHT ARM Performed at [facility], [address] [place], [place] [address]    Special Requests   Final    BOTTLES DRAWN AEROBIC AND ANAEROBIC Blood Culture adequate volume Performed at [facility], [address], [place], [place] [address]    Culture   Final    NO GROWTH 4 DAYS Performed at [facility], [address] [place], [place] [address]    Report Status PENDING  Incomplete  Culture, blood (Routine x 2)     Status: None (Preliminary result)   Collection Time: 07/18/22 11:23 PM   Specimen: BLOOD  Result Value Ref Range Status   Specimen Description   Final    BLOOD BLOOD RIGHT HAND Performed at [facility], [address]  [place], [place] [address]    Special Requests   Final    BOTTLES DRAWN AEROBIC AND ANAEROBIC Blood Culture adequate volume Performed at [facility], [address] [place], [place] [address]    Culture   Final    NO GROWTH 3 DAYS Performed at [facility], [address] [place], [place] [address]    Report Status PENDING  Incomplete    SIGNED:   Marinda ElkAbraham Feliz Ortiz, MD  [facility] 07/23/2022, 9:17 AM Pager   If 7PM-7AM, please contact night-coverage [email or web address] Password [email or web address]

## 2022-07-23 NOTE — Progress Notes (Signed)
Came to see patient after she pass out in the bathroom and hit her head with border of shower border. She relates she went to bathroom, started to feel lightheaded, similar to her previous episodes. Next thing she remember she was on the floor. She lost consciousness. Nurse found her , patient was alert and conversant. Patient sitting in bed in no distress, answer questions appropriately. Neuro exam non focal.   Plan: Syncope, recurrent. Suspect related to prior history of Orthostatic Hypotension. Sh has hematoma left side parietal temporal side.  -Check EKG -IV fluids.  -Plan to Check CT head stat.

## 2022-07-24 DIAGNOSIS — N3001 Acute cystitis with hematuria: Secondary | ICD-10-CM | POA: Diagnosis not present

## 2022-07-24 DIAGNOSIS — K219 Gastro-esophageal reflux disease without esophagitis: Secondary | ICD-10-CM | POA: Diagnosis not present

## 2022-07-24 DIAGNOSIS — E872 Acidosis, unspecified: Secondary | ICD-10-CM | POA: Diagnosis not present

## 2022-07-24 DIAGNOSIS — N3000 Acute cystitis without hematuria: Secondary | ICD-10-CM | POA: Diagnosis not present

## 2022-07-24 LAB — CULTURE, BLOOD (ROUTINE X 2)
Culture: NO GROWTH
Special Requests: ADEQUATE

## 2022-07-24 LAB — GLUCOSE, CAPILLARY
Glucose-Capillary: 93 mg/dL (ref 70–99)
Glucose-Capillary: 87 mg/dL (ref 70–99)

## 2022-07-24 NOTE — Discharge Summary (Signed)
Physician Discharge Summary  KOLENE CORLESS GYJ:[id number] DOB: 08-21-1945 DOA: 07/18/2022  PCP: Jac Canavan, PA-C  Admit date: 07/18/2022 Discharge date: 07/24/2022  Admitted From: Home Disposition:  home  Recommendations for Outpatient Follow-up:  Follow up with gastroenterology in 1-2 weeks Please obtain BMP/CBC in one week   Home Health:No Equipment/Devices:None  Discharge Condition:Stable CODE STATUS:fULL Diet recommendation: Heart Healthy  Brief/Interim Summary: 77 y.o. female history of diabetes mellitus comes in with vomiting admitted for UTI   Discharge Diagnoses:  Principal Problem:   UTI (urinary tract infection) Active Problems:   Hypothyroidism   Nausea and vomiting   Osteomyelitis of lumbar spine   Chronic diastolic CHF (congestive heart failure)   Hypokalemia   Type 2 diabetes mellitus   Loss of weight   Gastroesophageal reflux disease  Intractable nausea vomiting likely due to gastritis: Started on Protonix IV twice daily she continued to vomit despite treatment. GI was consulted to perform an EGD on 07/22/2022 that showed ectopic gastric mucosa focal scarring and a small heel ulcer in the prepyloric area 3 gastric polyps biopsies were taken we will follow-up results with GI as an outpatient. She continue Protonix p.o. twice daily will continue soft diet as an outpatient.  Probable UTI: Will treat empirically with oral Omnicef.  Chronic kidney disease stage I: Creatinine at baseline.  Hypokalemia/hypomagnesemia: Replete orally now resolved.  Non-insulin-dependent diabetes mellitus type 2: With an A1c of 5.8 CBG stable, GI recommended to hold on semaglutide until I see her as a follow-up.  History of chronic osteomyelitis of the lumbar spine with MSSA bacteremia: Follow-up with infectious disease as an outpatient continue cefadroxil as an outpatient.  Chronic diastolic heart failure: No changes made appears euvolemic.  Orthostatic  hypotension: She had a fall and hit her head CT of the head was negative, twelve-lead EKG showed no findings. He is due to lowering over midodrine she was put back on her home dose. And she has been symptom-free. She has been advised to wear TED hose.  And continue midodrine p.o. 3 times daily 10 mg.  Hypothyroidism: Continue Synthroid.    Discharge Instructions  Discharge Instructions     Diet - low sodium heart healthy   Complete by: As directed    Diet - low sodium heart healthy   Complete by: As directed    Increase activity slowly   Complete by: As directed    Increase activity slowly   Complete by: As directed       Allergies as of 07/24/2022       Reactions   Ampicillin Hives, Other (See Comments)   Severe reaction in February 2017 1.5 month to have hives to go away   Gabapentin Swelling   UNSPECIFIED REACTION    Penicillins Hives, Other (See Comments)   Severe reaction in February 2017 1.5 month to have hives to go away Has patient had a PCN reaction causing immediate rash, facial/tongue/throat swelling, SOB or lightheadedness with hypotension: #  #  #  YES  #  #  #  Has patient had a PCN reaction causing severe rash involving mucus membranes or skin necrosis: No Has patient had a PCN reaction that required hospitalization No Has patient had a PCN reaction occurring within the last 10 years: No   Other Itching   UNSPECIFIED Analgesics        Medication List     STOP taking these medications    PROBIOTIC DAILY PO       TAKE  these medications    allopurinol 300 MG tablet Commonly known as: ZYLOPRIM Take 300 mg by mouth daily.   amitriptyline 100 MG tablet Commonly known as: ELAVIL Take 1 tablet (100 mg total) by mouth at bedtime.   atorvastatin 80 MG tablet Commonly known as: LIPITOR Take 1 tablet (80 mg total) by mouth daily.   Biotin 3 MG Tabs Take 3 mg by mouth daily.   cefadroxil 500 MG capsule Commonly known as: DURICEF Take 1 capsule  (500 mg total) by mouth 2 (two) times daily.   colchicine 0.6 MG tablet Take 2 tablets at once then take 1 tablet an hour later then then 1 tablet twice daily x 3 days.   diazepam 10 MG tablet Commonly known as: VALIUM Take 1 tablet (10 mg total) by mouth daily as needed for anxiety.   DULoxetine 60 MG capsule Commonly known as: CYMBALTA TAKE 1 CAPSULE(60 MG) BY MOUTH DAILY What changed:  how much to take how to take this when to take this additional instructions   FreeStyle Libre 3 Sensor Misc Place 1 sensor on the skin every 14 days. Use to check glucose continuously   HYDROcodone-acetaminophen 5-325 MG tablet Commonly known as: NORCO/VICODIN Take 1 tablet by mouth every 4 (four) hours as needed for moderate pain.   levothyroxine 100 MCG tablet Commonly known as: Synthroid Take 1 tablet (100 mcg total) by mouth daily.   Magnesium Gluconate 250 MG Tabs Take 1 tablet by mouth daily.   midodrine 10 MG tablet Commonly known as: PROAMATINE Take 1 tablet (10 mg total) by mouth 3 (three) times daily with meals.   OXYGEN Inhale 2 L into the lungs.   pantoprazole 40 MG tablet Commonly known as: PROTONIX Take 1 tablet (40 mg total) by mouth 2 (two) times daily. What changed: when to take this   polyethylene glycol powder 17 GM/SCOOP powder Commonly known as: GLYCOLAX/MIRALAX Take 17 g by mouth daily as needed for moderate constipation.   primidone 50 MG tablet Commonly known as: MYSOLINE TAKE 1 TABLET BY MOUTH TWICE DAILY   rOPINIRole 1 MG tablet Commonly known as: REQUIP TAKE 1 TABLET(1 MG) BY MOUTH AT BEDTIME What changed:  how much to take how to take this when to take this additional instructions   Semaglutide (1 MG/DOSE) 4 MG/3ML Sopn Inject 1 mg as directed once a week.   SYSTANE BALANCE OP Place 1 drop into both eyes 4 (four) times daily as needed (dry eyes).   topiramate 25 MG tablet Commonly known as: Topamax Take 3 tablets (75 mg total) by mouth  at bedtime.   [name] Ellipta 100-62.5-25 MCG/ACT Aepb Generic drug: Fluticasone-Umeclidin-Vilant Inhale 1 puff into the lungs daily.   Vitamin D3 50 MCG (2000 UT) Caps Generic drug: Cholecalciferol Take 1 capsule (2,000 Units total) by mouth daily.        Follow-up Information     Meryl Dare, MD Follow up in 2 week(s).   Specialty: Gastroenterology Contact information: [address] [place] [address] [phone]                Allergies  Allergen Reactions   Ampicillin Hives and Other (See Comments)    Severe reaction in February 2017 1.5 month to have hives to go away   Gabapentin Swelling    UNSPECIFIED REACTION    Penicillins Hives and Other (See Comments)    Severe reaction in February 2017 1.5 month to have hives to go away Has patient had  a PCN reaction causing immediate rash, facial/tongue/throat swelling, SOB or lightheadedness with hypotension: #  #  #  YES  #  #  #  Has patient had a PCN reaction causing severe rash involving mucus membranes or skin necrosis: No Has patient had a PCN reaction that required hospitalization No Has patient had a PCN reaction occurring within the last 10 years: No    Other Itching    UNSPECIFIED Analgesics    Consultations: Gastroente allergy   Procedures/Studies: CT HEAD WO CONTRAST ( )  Result Date: 07/23/2022 CLINICAL DATA:  77 year old female status post fall from bed. Struck back of head. EXAM: CT HEAD WITHOUT CONTRAST TECHNIQUE: Contiguous axial images were obtained from the base of the skull through the vertex without intravenous contrast. RADIATION DOSE REDUCTION: This exam was performed according to the departmental dose-optimization program which includes automated exposure control, adjustment of the mA and/or kV according to patient size and/or use of iterative reconstruction technique. COMPARISON:  Brain MRI 03/02/2019, head CT 03/01/2019. FINDINGS: Brain: Stable cerebral volume since 2020,  within normal limits for age. No midline shift, ventriculomegaly, mass effect, evidence of mass lesion, intracranial hemorrhage or evidence of cortically based acute infarction. Chronic Patchy and confluent bilateral cerebral white matter hypodensity is stable to mildly progressed. No cortical encephalomalacia identified. Vascular: Calcified atherosclerosis at the skull base. No suspicious intracranial vascular hyperdensity. Skull: No fracture identified. Sinuses/Orbits: Visualized paranasal sinuses and mastoids are stable and well aerated. Other: Left posterior convexity scalp hematoma measuring up to 11 mm on series 3, image 50. Underlying calvarium appears intact. No scalp soft tissue gas. Incidental small chronic left frontal convexity scalp lipoma (benign. Stable and negative orbits soft tissues. IMPRESSION: 1. Left posterior convexity scalp hematoma without underlying skull fracture. 2. No acute intracranial abnormality. Chronic cerebral white matter disease, most commonly small vessel ischemia related. Electronically Signed   By: Odessa Fleming M.D.   On: 07/23/2022 11:18   DG Chest 2 View  Result Date: 07/18/2022 CLINICAL DATA:  Possible sepsis EXAM: CHEST - 2 VIEW COMPARISON:  06/20/2022 FINDINGS: Transverse diameter of heart is in upper limits of normal. There are no signs of pulmonary edema or focal pulmonary consolidation. There is no pleural effusion or pneumothorax. Surgical clips are seen in left chest wall. IMPRESSION: There are no signs of pulmonary edema or focal pulmonary consolidation. Electronically Signed   By: Ernie Avena M.D.   On: 07/18/2022 15:41   CARDIAC EVENT MONITOR  Result Date: 07/07/2022 1 triggered event that was automatic for sinus tachycardia. No significant tachyarrhythmia or bradyarrhythmia. No atrial fibrillation or flutter.  ECHOCARDIOGRAM COMPLETE  Result Date: 06/27/2022    ECHOCARDIOGRAM REPORT   Patient Name:   MEIYA WISLER Date of Exam: 06/27/2022 Medical  Rec #:  [id number]        Height:       67.5 in Accession #:    [id number]       Weight:       191.0 lb Date of Birth:  [birth year]        BSA:          1.994 m Patient Age:    76 years         BP:           100/61 mmHg Patient Gender: F                HR:           93 bpm. Exam Location:  Inpatient Procedure: 2D Echo, Cardiac Doppler and Color Doppler Indications:    syncope  History:        Patient has prior history of Echocardiogram examinations. CHF,                 COPD; Risk Factors:Hypertension and Dyslipidemia.  Sonographer:    Mike Gip Referring Phys: [id number] Dorothe Pea BRANCH IMPRESSIONS  1. Left ventricular ejection fraction, by estimation, is 60 to 65%. The left ventricle has normal function. The left ventricle has no regional wall motion abnormalities. There is moderate left ventricular hypertrophy. Left ventricular diastolic parameters are consistent with Grade I diastolic dysfunction (impaired relaxation).  2. Right ventricular systolic function is normal. The right ventricular size is normal. There is normal pulmonary artery systolic pressure.  3. The mitral valve is normal in structure. No evidence of mitral valve regurgitation. No evidence of mitral stenosis.  4. The aortic valve was not well visualized. Aortic valve regurgitation is not visualized. No aortic stenosis is present.  5. Aortic dilatation noted. There is borderline dilatation of the aortic root, measuring 39 mm. There is mild dilatation of the ascending aorta, measuring 40 mm.  6. The inferior vena cava is normal in size with <50% respiratory variability, suggesting right atrial pressure of 8 mmHg. FINDINGS  Left Ventricle: Left ventricular ejection fraction, by estimation, is 60 to 65%. The left ventricle has normal function. The left ventricle has no regional wall motion abnormalities. The left ventricular internal cavity size was normal in size. There is  moderate left ventricular hypertrophy. Left ventricular diastolic  parameters are consistent with Grade I diastolic dysfunction (impaired relaxation). Right Ventricle: The right ventricular size is normal. No increase in right ventricular wall thickness. Right ventricular systolic function is normal. There is normal pulmonary artery systolic pressure. The tricuspid regurgitant velocity is 2.35 m/s, and  with an assumed right atrial pressure of 3 mmHg, the estimated right ventricular systolic pressure is 25.1 mmHg. Left Atrium: Left atrial size was normal in size. Right Atrium: Right atrial size was normal in size. Pericardium: There is no evidence of pericardial effusion. Mitral Valve: The mitral valve is normal in structure. No evidence of mitral valve regurgitation. No evidence of mitral valve stenosis. Tricuspid Valve: The tricuspid valve is normal in structure. Tricuspid valve regurgitation is trivial. No evidence of tricuspid stenosis. Aortic Valve: The aortic valve was not well visualized. Aortic valve regurgitation is not visualized. No aortic stenosis is present. Pulmonic Valve: The pulmonic valve was normal in structure. Pulmonic valve regurgitation is not visualized. No evidence of pulmonic stenosis. Aorta: Aortic dilatation noted. There is borderline dilatation of the aortic root, measuring 39 mm. There is mild dilatation of the ascending aorta, measuring 40 mm. Venous: The inferior vena cava is normal in size with less than 50% respiratory variability, suggesting right atrial pressure of 8 mmHg. IAS/Shunts: No atrial level shunt detected by color flow Doppler.  LEFT VENTRICLE PLAX 2D LVIDd:         4.90 cm     Diastology LVIDs:         3.65 cm     LV e' medial:    6.85 cm/s LV PW:         1.80 cm     LV E/e' medial:  8.5 LV IVS:        1.60 cm     LV e' lateral:   7.29 cm/s LVOT diam:     2.10 cm     LV  E/e' lateral: 8.0 LV SV:         71 LV SV Index:   36 LVOT Area:     3.46 cm  LV Volumes (MOD) LV vol d, MOD A2C: 87.6 ml LV vol d, MOD A4C: 95.4 ml LV vol s, MOD A2C:  34.4 ml LV vol s, MOD A4C: 41.0 ml LV SV MOD A2C:     53.2 ml LV SV MOD A4C:     95.4 ml LV SV MOD BP:      55.5 ml RIGHT VENTRICLE             IVC RV Basal diam:  4.00 cm     IVC diam: 1.90 cm RV S prime:     11.30 cm/s TAPSE (M-mode): 1.7 cm LEFT ATRIUM             Index        RIGHT ATRIUM           Index LA diam:        3.70 cm 1.86 cm/m   RA Area:     18.20 cm LA Vol (A2C):   54.4 ml 27.28 ml/m  RA Volume:   53.00 ml  26.58 ml/m LA Vol (A4C):   45.8 ml 22.97 ml/m LA Biplane Vol: 53.3 ml 26.73 ml/m  AORTIC VALVE LVOT Vmax:   104.00 cm/s LVOT Vmean:  63.000 cm/s LVOT VTI:    0.205 m  AORTA Ao Root diam: 3.90 cm Ao Asc diam:  4.00 cm MITRAL VALVE               TRICUSPID VALVE MV Area (PHT): 4.17 cm    TR Peak grad:   22.1 mmHg MV Decel Time: 182 msec    TR Vmax:        235.00 cm/s MV E velocity: 58.30 cm/s MV A velocity: 87.80 cm/s  SHUNTS MV E/A ratio:  0.66        Systemic VTI:  0.20 m                            Systemic Diam: 2.10 cm Arvilla Meres MD Electronically signed by Arvilla Meres MD Signature Date/Time: 06/27/2022/3:46:58 PM    Final    VAS US CAROTID  Result Date: 06/27/2022 Carotid Arterial Duplex Study Patient Name:  STEPHAIE DARDIS  Date of Exam:   06/27/2022 Medical Rec #: [id number]         Accession #:    [id number] Date of Birth: 1945-08-07         Patient Gender: F Patient Age:   89 years Exam Location:  [facility] Procedure:      VAS US CAROTID Referring Phys: Hartley Barefoot --------------------------------------------------------------------------------  Indications:      Bilateral bruits and Syncope. Risk Factors:     Hypertension, hyperlipidemia, Diabetes, past history of                   smoking. Other Factors:    CHF. Comparison Study: No previous exams Performing Technologist: Jody Hill RVT, RDMS  Examination Guidelines: A complete evaluation includes B-mode imaging, spectral Doppler, color Doppler, and power Doppler as needed of all accessible portions of each  vessel. Bilateral testing is considered an integral part of a complete examination. Limited examinations for reoccurring indications may be performed as noted.  Right Carotid Findings: +----------+--------+--------+--------+------------------+------------------+           PSV cm/sEDV cm/sStenosisPlaque  DescriptionComments           +----------+--------+--------+--------+------------------+------------------+ CCA Prox  80      13                                intimal thickening +----------+--------+--------+--------+------------------+------------------+ CCA Distal67      21                                intimal thickening +----------+--------+--------+--------+------------------+------------------+ ICA Prox  66      19              calcific                             +----------+--------+--------+--------+------------------+------------------+ ICA Distal48      21                                                   +----------+--------+--------+--------+------------------+------------------+ ECA       77      11                                                   +----------+--------+--------+--------+------------------+------------------+ +----------+--------+-------+----------------+-------------------+           PSV cm/sEDV cmsDescribe        Arm Pressure (mmHG) +----------+--------+-------+----------------+-------------------+ [id number]            Multiphasic, WNL                    +----------+--------+-------+----------------+-------------------+ +---------+--------+--+--------+-+---------+ VertebralPSV cm/s28EDV cm/s9Antegrade +---------+--------+--+--------+-+---------+  Left Carotid Findings: +----------+--------+--------+--------+------------------+------------------+           PSV cm/sEDV cm/sStenosisPlaque DescriptionComments           +----------+--------+--------+--------+------------------+------------------+ CCA Prox  72       17                                intimal thickening +----------+--------+--------+--------+------------------+------------------+ CCA Distal60      19                                intimal thickening +----------+--------+--------+--------+------------------+------------------+ ICA Prox  61      27                                tortuous           +----------+--------+--------+--------+------------------+------------------+ ICA Distal54      19                                                   +----------+--------+--------+--------+------------------+------------------+ ECA       76      11                                                   +----------+--------+--------+--------+------------------+------------------+ +----------+--------+--------+----------------+-------------------+  PSV cm/sEDV cm/sDescribe        Arm Pressure (mmHG) +----------+--------+--------+----------------+-------------------+ [id number]             Multiphasic, WNL                    +----------+--------+--------+----------------+-------------------+ +---------+--------+--+--------+--+---------+ VertebralPSV cm/s43EDV cm/s17Antegrade +---------+--------+--+--------+--+---------+   Summary: Right Carotid: The extracranial vessels were near-normal with only minimal wall                thickening or plaque. Left Carotid: The extracranial vessels were near-normal with only minimal wall               thickening or plaque. Vertebrals:  Bilateral vertebral arteries demonstrate antegrade flow. Subclavians: Normal flow hemodynamics were seen in bilateral subclavian              arteries. *See table(s) above for measurements and observations.  Electronically signed by Delia Heady MD on 06/27/2022 at 11:13:08 AM.    Final    (Echo, Carotid, EGD, Colonoscopy, ERCP)    Subjective: No plaints tolerating her diet.  Discharge Exam: Vitals:   07/24/22 0041 07/24/22 0611  BP: 110/79  123/72  Pulse: 100 81  Resp: 20 19  Temp: 97.8 F (36.6 C) 97.9 F (36.6 C)  SpO2: 94% 91%   Vitals:   07/23/22 2010 07/23/22 2150 07/24/22 0041 07/24/22 0611  BP: 108/68 93/63 110/79 123/72  Pulse: 97 97 100 81  Resp: 19  20 19   Temp: 98.3 F (36.8 C)  97.8 F (36.6 C) 97.9 F (36.6 C)  TempSrc: Oral  Oral   SpO2: 96%  94% 91%  Weight:      Height:        General: Pt is alert, awake, not in acute distress Cardiovascular: RRR, S1/S2 +, no rubs, no gallops Respiratory: CTA bilaterally, no wheezing, no rhonchi Abdominal: Soft, NT, ND, bowel sounds + Extremities: no edema, no cyanosis    The results of significant diagnostics from this hospitalization (including imaging, microbiology, ancillary and laboratory) are listed below for reference.     Microbiology: Recent Results (from the past 240 hour(s))  Urine Culture (for pregnant, neutropenic or urologic patients or patients with an indwelling urinary catheter)     Status: None   Collection Time: 07/18/22 12:54 PM   Specimen: Urine, Clean Catch  Result Value Ref Range Status   Specimen Description   Final    URINE, CLEAN CATCH Performed at [facility], [address], [place], [place] [address]    Special Requests   Final    NONE Performed at [facility], [address], [place], [place] [address]    Culture   Final    NO GROWTH Performed at [facility], [address], [place], [place] [address]    Report Status 07/19/2022 FINAL  Final  Culture, blood (Routine x 2)     Status: None   Collection Time: 07/18/22  3:05 PM   Specimen: BLOOD RIGHT ARM  Result Value Ref Range Status   Specimen Description   Final    BLOOD RIGHT ARM Performed at [facility], [address] [place], [place] [address]    Special Requests   Final    BOTTLES DRAWN AEROBIC AND ANAEROBIC Blood Culture adequate volume Performed at [facility], [address], [place], [place] [address]    Culture   Final    NO GROWTH 5 DAYS Performed at [facility],  [address] [place] [address] [place], [place] [address]    Report Status 07/23/2022 FINAL  Final  Culture, blood (Routine x 2)     Status: None (Preliminary result)   Collection Time: 07/18/22 11:23 PM   Specimen: BLOOD  Result Value Ref Range Status   Specimen Description   Final    BLOOD BLOOD RIGHT HAND Performed at [facility], [address] [place], [place] [address]    Special Requests   Final    BOTTLES DRAWN AEROBIC AND ANAEROBIC Blood Culture adequate volume Performed at [facility], [address] [place], [place] [address]    Culture   Final    NO GROWTH 4 DAYS Performed at [facility], [address], [place], [place] [address]    Report Status PENDING  Incomplete     Labs: BNP (last 3 results) Recent Labs    04/07/22 0936 07/19/22 0428  BNP 55.2 122.2*    Basic Metabolic Panel: Recent Labs  Lab 07/18/22 1254 07/19/22 0428 07/20/22 0446 07/23/22 1255  NA 144 143 142 137  K 3.3* 3.0* 3.9 4.1  CL 105 110 110 108  CO2 26 26 24 23   GLUCOSE 119* 83 83 97  BUN 23 18 12 9   CREATININE 1.11* 0.94 0.89 0.87  CALCIUM 10.1 9.0 9.3 8.9  MG  --  1.5* 1.8 1.9    Liver Function Tests: Recent Labs  Lab 07/18/22 1254 07/19/22 0428  AST 19 16  ALT 13 14  ALKPHOS 77 69  BILITOT 0.7 0.8  PROT 6.5 5.2*  ALBUMIN 3.9 3.1*    Recent Labs  Lab 07/18/22 1254 07/19/22 0428  LIPASE 96* 60*    No results for input(s): "AMMONIA" in the last 168 hours. CBC: Recent Labs  Lab 07/18/22 1254 07/19/22 0428  WBC 6.8 7.4  HGB 14.3 12.6  HCT 43.5 40.1  MCV 94.4 96.9  PLT 280 236    Cardiac Enzymes: No results for input(s): "CKTOTAL", "CKMB", "CKMBINDEX", "TROPONINI" in the last 168 hours. BNP: Invalid input(s): "POCBNP" CBG: Recent Labs  Lab 07/23/22 0751 07/23/22 1038 07/23/22 1654 07/23/22 2208 07/24/22 0741   GLUCAP 100* 84 96 92 93    D-Dimer No results for input(s): "DDIMER" in the last 72 hours. Hgb A1c No results for input(s): "HGBA1C" in the last 72 hours. Lipid Profile No results for input(s): "CHOL", "HDL", "LDLCALC", "TRIG", "CHOLHDL", "LDLDIRECT" in the last 72 hours. Thyroid function studies No results for input(s): "TSH", "T4TOTAL", "T3FREE", "THYROIDAB" in the last 72 hours.  Invalid input(s): "FREET3" Anemia work up No results for input(s): "VITAMINB12", "FOLATE", "FERRITIN", "TIBC", "IRON", "RETICCTPCT" in the last 72 hours. Urinalysis    Component Value Date/Time   COLORURINE YELLOW 07/18/2022 1254   APPEARANCEUR CLOUDY (A) 07/18/2022 1254   LABSPEC 1.041 (H) 07/18/2022 1254   LABSPEC 1.025 06/24/2019 1426   PHURINE 5.5 07/18/2022 1254   GLUCOSEU NEGATIVE 07/18/2022 1254   HGBUR SMALL (A) 07/18/2022 1254   BILIRUBINUR NEGATIVE 07/18/2022 1254   BILIRUBINUR moderate (A) 06/24/2019 1426   BILIRUBINUR n 11/25/2015 1712   KETONESUR NEGATIVE 07/18/2022 1254   PROTEINUR 30 (A) 07/18/2022 1254   UROBILINOGEN negative 11/25/2015 1712   NITRITE NEGATIVE 07/18/2022 1254   LEUKOCYTESUR LARGE (A) 07/18/2022 1254   Sepsis Labs Recent Labs  Lab 07/18/22 1254 07/19/22 0428  WBC 6.8 7.4    Microbiology Recent Results (from the past 240 hour(s))  Urine Culture (for pregnant, neutropenic or urologic patients or patients  with an indwelling urinary catheter)     Status: None   Collection Time: 07/18/22 12:54 PM   Specimen: Urine, Clean Catch  Result Value Ref Range Status   Specimen Description   Final    URINE, CLEAN CATCH Performed at [facility], [address], [place], [place] [address]    Special Requests   Final    NONE Performed at [facility], [address], [place], [place] [address]    Culture   Final    NO GROWTH Performed at [facility], [address] [place], [place] [address]    Report Status  07/19/2022 FINAL  Final  Culture, blood (Routine x 2)     Status: None   Collection Time: 07/18/22  3:05 PM   Specimen: BLOOD RIGHT ARM  Result Value Ref Range Status   Specimen Description   Final    BLOOD RIGHT ARM Performed at [facility], [address] [place], [place] [address]    Special Requests   Final    BOTTLES DRAWN AEROBIC AND ANAEROBIC Blood Culture adequate volume Performed at [facility], [address], [place], [place] [address]    Culture   Final    NO GROWTH 5 DAYS Performed at [facility], [address] [place], [place] [address]    Report Status 07/23/2022 FINAL  Final  Culture, blood (Routine x 2)     Status: None (Preliminary result)   Collection Time: 07/18/22 11:23 PM   Specimen: BLOOD  Result Value Ref Range Status   Specimen Description   Final    BLOOD BLOOD RIGHT HAND Performed at [facility], [address] [place], [place] [address]    Special Requests   Final    BOTTLES DRAWN AEROBIC AND ANAEROBIC Blood Culture adequate volume Performed at [facility], [address] [place], [place] [address]    Culture   Final    NO GROWTH 4 DAYS Performed at [facility], [address] [place], [place] [address]    Report Status PENDING  Incomplete    SIGNED:   Marinda ElkAbraham Feliz Ortiz, MD  [facility] 07/24/2022, 8:55 AM Pager   If 7PM-7AM, please contact night-coverage [email or web address] Password [email or web address]

## 2022-07-25 ENCOUNTER — Telehealth: Payer: Self-pay

## 2022-07-25 ENCOUNTER — Telehealth: Payer: Self-pay | Admitting: Gastroenterology

## 2022-07-25 NOTE — Telephone Encounter (Signed)
Dr. Adela Lank, would you be willing to do a telehealth video on 07/27/22 at 4 pm?

## 2022-07-25 NOTE — Telephone Encounter (Signed)
Carollee Herter returned call and accepted virtual appt on 07/27/22 at 4 pm. Appt has been updated.

## 2022-07-25 NOTE — Telephone Encounter (Signed)
Inbound call from daughter, states patient was admitted to the hospital over the weekend. States she had labs drawn for H. Pylori and also pathology from procedure. They were told to follow up in two weeks from procedure however, soonest appointment with Dr. Adela Lank was for 6/26. Daughter is also requesting appointment to be a telehealth appointment. Would like something sooner as well. Please advise.

## 2022-07-25 NOTE — Telephone Encounter (Signed)
Lm on vm for patient's daughter Carollee Herter to return call.

## 2022-07-25 NOTE — Telephone Encounter (Signed)
Hi [place], yes that is fine, you can put her on for 07/27/22 at 4PM and virtual is okay if that is her preference. Thanks

## 2022-07-25 NOTE — Transitions of Care (Post Inpatient/ED Visit) (Signed)
   07/25/2022  Name: Brandi Stephens MRN: [id number] DOB: [birth year]  Today's TOC FU Call Status: Today's TOC FU Call Status:: Successful TOC FU Call Competed TOC FU Call Complete Date: 07/25/22  Transition Care Management Follow-up Telephone Call Date of Discharge: 07/24/22 Discharge Facility: [facility]) Type of Discharge: Inpatient Admission Primary Inpatient Discharge Diagnosis:: "lactic acidosis,acute cystitis w/out hematuria" How have you been since you were released from the hospital?: Better (pt states she rested well last night-has had some :slight dizziness earlier today"-has not checked BP yet but will do so-appetite fair-drinking Gatorade and protein shakes.) Any questions or concerns?: No  Items Reviewed: Did you receive and understand the discharge instructions provided?: Yes Medications obtained and verified?: Yes (Medications Reviewed) Any new allergies since your discharge?: No Dietary orders reviewed?: Yes Type of Diet Ordered:: low salt/heart healthy/card modified Do you have support at home?: Yes People in Home: spouse Name of Support/Comfort Primary Source: Brandi Stephens- pt states they are temporarily staying with daughter in [place] but plans to return home to [facility] after follow up MD appts comleted  Home Care and Equipment/Supplies: Were Home Health Services Ordered?: No (pt had HH services prior to admission-[facility]-states she will call to resume services and arrange home visit) Any new equipment or medical supplies ordered?: No  Functional Questionnaire: Do you need assistance with bathing/showering or dressing?: No Do you need assistance with meal preparation?: No Do you need assistance with eating?: No Do you have difficulty maintaining continence: No Do you need assistance with getting out of bed/getting out of a chair/moving?: No Do you have difficulty managing or taking your medications?: No  Follow up appointments reviewed: PCP Follow-up  appointment confirmed?: Yes Date of PCP follow-up appointment?: 07/26/22 Follow-up Provider: Loraine Grip Specialist [facility] Follow-up appointment confirmed?: Yes Date of Specialist follow-up appointment?: 07/27/22 Follow-Up Specialty Provider:: Dr. Velta Addison visit,  Dr. Branch-08/02/22 Do you need transportation to your follow-up appointment?: No Do you understand care options if your condition(s) worsen?: Yes-patient verbalized understanding   TOC Interventions Today    Flowsheet Row Most Recent Value  TOC Interventions   TOC Interventions Discussed/Reviewed TOC Interventions Discussed      Interventions Today    Flowsheet Row Most Recent Value  General Interventions   General Interventions Discussed/Reviewed General Interventions Discussed, Doctor Visits  Doctor Visits Discussed/Reviewed Doctor Visits Discussed, Specialist, PCP  PCP/Specialist Visits Compliance with follow-up visit  Education Interventions   Education Provided Provided Education  Provided Verbal Education On Nutrition, When to see the doctor, Medication  Nutrition Interventions   Nutrition Discussed/Reviewed Nutrition Discussed, Adding fruits and vegetables, Decreasing salt  Pharmacy Interventions   Pharmacy Dicussed/Reviewed Pharmacy Topics Discussed, Medications and their functions  Safety Interventions   Safety Discussed/Reviewed Safety Discussed, Fall Risk, Home Safety        Alessandra Grout [facility] Care Management Care Management Community Coordinator Direct Phone: [phone] Toll Free: [phone] Fax: [phone]

## 2022-07-25 NOTE — Telephone Encounter (Signed)
Error

## 2022-07-26 ENCOUNTER — Ambulatory Visit (INDEPENDENT_AMBULATORY_CARE_PROVIDER_SITE_OTHER): Payer: Medicare HMO | Admitting: Medical

## 2022-07-26 ENCOUNTER — Encounter (HOSPITAL_COMMUNITY): Payer: Self-pay | Admitting: Gastroenterology

## 2022-07-26 ENCOUNTER — Encounter: Payer: Self-pay | Admitting: Gastroenterology

## 2022-07-26 VITALS — BP 118/70 | HR 60 | Ht 68.0 in | Wt 171.6 lb

## 2022-07-26 DIAGNOSIS — E079 Disorder of thyroid, unspecified: Secondary | ICD-10-CM | POA: Diagnosis not present

## 2022-07-26 DIAGNOSIS — Z79899 Other long term (current) drug therapy: Secondary | ICD-10-CM

## 2022-07-26 DIAGNOSIS — K5909 Other constipation: Secondary | ICD-10-CM

## 2022-07-26 DIAGNOSIS — I130 Hypertensive heart and chronic kidney disease with heart failure and stage 1 through stage 4 chronic kidney disease, or unspecified chronic kidney disease: Secondary | ICD-10-CM | POA: Diagnosis not present

## 2022-07-26 DIAGNOSIS — G2581 Restless legs syndrome: Secondary | ICD-10-CM | POA: Diagnosis not present

## 2022-07-26 DIAGNOSIS — M48061 Spinal stenosis, lumbar region without neurogenic claudication: Secondary | ICD-10-CM | POA: Diagnosis not present

## 2022-07-26 DIAGNOSIS — E119 Type 2 diabetes mellitus without complications: Secondary | ICD-10-CM | POA: Diagnosis not present

## 2022-07-26 DIAGNOSIS — F419 Anxiety disorder, unspecified: Secondary | ICD-10-CM | POA: Diagnosis not present

## 2022-07-26 DIAGNOSIS — K5900 Constipation, unspecified: Secondary | ICD-10-CM | POA: Diagnosis not present

## 2022-07-26 DIAGNOSIS — Z8616 Personal history of COVID-19: Secondary | ICD-10-CM | POA: Diagnosis not present

## 2022-07-26 DIAGNOSIS — Z9981 Dependence on supplemental oxygen: Secondary | ICD-10-CM | POA: Diagnosis not present

## 2022-07-26 DIAGNOSIS — I951 Orthostatic hypotension: Secondary | ICD-10-CM | POA: Diagnosis not present

## 2022-07-26 DIAGNOSIS — K219 Gastro-esophageal reflux disease without esophagitis: Secondary | ICD-10-CM

## 2022-07-26 DIAGNOSIS — E785 Hyperlipidemia, unspecified: Secondary | ICD-10-CM | POA: Diagnosis not present

## 2022-07-26 DIAGNOSIS — I5032 Chronic diastolic (congestive) heart failure: Secondary | ICD-10-CM | POA: Diagnosis not present

## 2022-07-26 DIAGNOSIS — R296 Repeated falls: Secondary | ICD-10-CM

## 2022-07-26 DIAGNOSIS — M4626 Osteomyelitis of vertebra, lumbar region: Secondary | ICD-10-CM

## 2022-07-26 DIAGNOSIS — R112 Nausea with vomiting, unspecified: Secondary | ICD-10-CM

## 2022-07-26 DIAGNOSIS — Z6828 Body mass index (BMI) 28.0-28.9, adult: Secondary | ICD-10-CM | POA: Diagnosis not present

## 2022-07-26 DIAGNOSIS — D631 Anemia in chronic kidney disease: Secondary | ICD-10-CM | POA: Diagnosis not present

## 2022-07-26 DIAGNOSIS — J449 Chronic obstructive pulmonary disease, unspecified: Secondary | ICD-10-CM | POA: Diagnosis not present

## 2022-07-26 DIAGNOSIS — E1122 Type 2 diabetes mellitus with diabetic chronic kidney disease: Secondary | ICD-10-CM | POA: Diagnosis not present

## 2022-07-26 DIAGNOSIS — K2970 Gastritis, unspecified, without bleeding: Secondary | ICD-10-CM

## 2022-07-26 DIAGNOSIS — N390 Urinary tract infection, site not specified: Secondary | ICD-10-CM

## 2022-07-26 DIAGNOSIS — Z96651 Presence of right artificial knee joint: Secondary | ICD-10-CM | POA: Diagnosis not present

## 2022-07-26 DIAGNOSIS — N3001 Acute cystitis with hematuria: Secondary | ICD-10-CM

## 2022-07-26 DIAGNOSIS — E1140 Type 2 diabetes mellitus with diabetic neuropathy, unspecified: Secondary | ICD-10-CM | POA: Diagnosis not present

## 2022-07-26 DIAGNOSIS — E872 Acidosis, unspecified: Secondary | ICD-10-CM | POA: Diagnosis not present

## 2022-07-26 DIAGNOSIS — N179 Acute kidney failure, unspecified: Secondary | ICD-10-CM | POA: Diagnosis not present

## 2022-07-26 DIAGNOSIS — F32A Depression, unspecified: Secondary | ICD-10-CM | POA: Diagnosis not present

## 2022-07-26 DIAGNOSIS — N189 Chronic kidney disease, unspecified: Secondary | ICD-10-CM | POA: Diagnosis not present

## 2022-07-26 DIAGNOSIS — M1030 Gout due to renal impairment, unspecified site: Secondary | ICD-10-CM | POA: Diagnosis not present

## 2022-07-26 DIAGNOSIS — E669 Obesity, unspecified: Secondary | ICD-10-CM | POA: Diagnosis not present

## 2022-07-26 LAB — BASIC METABOLIC PANEL

## 2022-07-26 LAB — CBC
Hematocrit: 38.8 % (ref 34.0–46.6)
Hemoglobin: 13.2 g/dL (ref 11.1–15.9)
MCH: 32.5 pg (ref 26.6–33.0)
Platelets: 247 10*3/uL (ref 150–450)
RDW: 14.5 % (ref 11.7–15.4)

## 2022-07-26 LAB — SURGICAL PATHOLOGY

## 2022-07-26 NOTE — Progress Notes (Signed)
Subjective:  Brandi Stephens is a 77 y.o. female who presents for Chief Complaint  Patient presents with   Hospitalization Follow-up    Hospital follow up, discharged 07/23/22. Fell in the bathroom the before she was d/c'd so she was held another day.      Here for hospital follow-up.  She is accompanied by her daughter Brandi Stephens.  Admit date 07/18/2022 Discharge date: 07/24/2022   Discharge Diagnoses:  Principal Problem:   UTI (urinary tract infection) Active Problems:   Hypothyroidism   Nausea and vomiting   Osteomyelitis of lumbar spine   Chronic diastolic CHF (congestive heart failure)   Hypokalemia   Type 2 diabetes mellitus   Loss of weight   Gastroesophageal reflux disease  She was admitted due to urinary tract infection, intractable nausea and vomiting, and weight loss.  GI was consulted.  She had a EGD performed on 07/22/2022 that showed ectopic gastric mucosa focal scarring and a small healed ulcer in the prepyloric area, 3 gastric polyps were biopsied.  She was recommended to have follow-up with gastroenterology as an outpatient and advised to follow-up in our office within 1 week for some repeat blood work.  Initially symptoms were mild urinary discomfort, but more fatigue and nausea that other symptoms.  She was advised to hold off on Ozempic/GLP-1 medication until her GI follow-up  Currently no urinary frequency, urgency, pain, no blood in urine.  Feels better in this regard.   She denies recent constipation.  daughter notes that initially they lowered midodrine, but she ended falling the day before discharge in the hospital.   Thus, they increased midodrine back up.    Sees cardiology in follow up next week   Past Medical History:  Diagnosis Date   AIN (anal intraepithelial neoplasia) anal canal    Anemia    with pregnancy   Anxiety    Arthritis    knees, lower back, knees   Chronic constipation    Chronic diastolic CHF (congestive heart failure) 07/05/2019    CKD (chronic kidney disease)    Coarse tremors    Essential   COPD (chronic obstructive pulmonary disease)    2017 chest CT, history of   Degenerative lumbar spinal stenosis    Depression    Depression 07/05/2019   Drug dependency    Elevated PTHrP level 05/09/2019   EtOH use 07/05/2019   Pt denies heavy drinking however   GERD (gastroesophageal reflux disease)    pt denies   Gout    Grade I diastolic dysfunction 02/01/2016   Noted ECHO   Hemorrhoids    History of adenomatous polyp of colon    History of basal cell carcinoma (BCC) excision 2015   nose   History of sepsis 05/14/2014   post lumbar surgery   History of shingles    x2   Hyperlipidemia    Hypertension    Hypothyroidism    Irregular heart beat    history of while undergoing radiation   Malignant neoplasm of upper-inner quadrant of left breast in female, estrogen receptor positive oncologist-  dr Darnelle Catalanmagrinat--  per lov in epic no recurrence   dx 07-13-2015  Left breast invasive ductal carcinoma, Stage IA, Grade 1 (TXN0),  09-16-2015  s/p  left breast lumpectomy with sln bx's,  completed radiation 11-18-2015,  started antiestrogen therapy 12-14-2015   Neuropathy    Left foot and back of left leg   Obesity (BMI 30-39.9) 07/05/2019   Personal history of radiation therapy  completed 11-18-2015  left breast   Pneumonia    PONV (postoperative nausea and vomiting)    Prediabetes    diet controlled, no med   Restless leg syndrome    Seasonal allergies    Seizure 05/2015   due to sepsis, only one time episode   Tremor    Wears partial dentures    lower    Current Outpatient Medications on File Prior to Visit  Medication Sig Dispense Refill   allopurinol (ZYLOPRIM) 300 MG tablet Take 300 mg by mouth daily.     amitriptyline (ELAVIL) 100 MG tablet Take 1 tablet (100 mg total) by mouth at bedtime. 90 tablet 0   atorvastatin (LIPITOR) 80 MG tablet Take 1 tablet (80 mg total) by mouth daily. 90 tablet 0   Biotin 3 MG  TABS Take 3 mg by mouth daily.     cefadroxil (DURICEF) 500 MG capsule Take 1 capsule (500 mg total) by mouth 2 (two) times daily. 60 capsule 11   Cholecalciferol (VITAMIN D3) 50 MCG (2000 UT) capsule Take 1 capsule (2,000 Units total) by mouth daily. 90 capsule 3   DULoxetine (CYMBALTA) 60 MG capsule TAKE 1 CAPSULE(60 MG) BY MOUTH DAILY (Patient taking differently: Take 60 mg by mouth daily.) 90 capsule 0   Fluticasone-Umeclidin-Vilant ([name] ELLIPTA) 100-62.5-25 MCG/ACT AEPB Inhale 1 puff into the lungs daily. 90 each 3   levothyroxine (SYNTHROID) 100 MCG tablet Take 1 tablet (100 mcg total) by mouth daily. 30 tablet 2   Magnesium Gluconate 250 MG TABS Take 1 tablet by mouth daily.     midodrine (PROAMATINE) 10 MG tablet Take 1 tablet (10 mg total) by mouth 3 (three) times daily with meals. 90 tablet 1   pantoprazole (PROTONIX) 40 MG tablet Take 1 tablet (40 mg total) by mouth 2 (two) times daily. 60 tablet 1   Propylene Glycol (SYSTANE BALANCE OP) Place 1 drop into both eyes 4 (four) times daily as needed (dry eyes).     rOPINIRole (REQUIP) 1 MG tablet TAKE 1 TABLET(1 MG) BY MOUTH AT BEDTIME (Patient taking differently: Take 1 mg by mouth at bedtime.) 90 tablet 0   topiramate (TOPAMAX) 25 MG tablet Take 3 tablets (75 mg total) by mouth at bedtime. 90 tablet 3   colchicine 0.6 MG tablet Take 2 tablets at once then take 1 tablet an hour later then then 1 tablet twice daily x 3 days. (Patient not taking: Reported on 07/19/2022) 30 tablet 2   Continuous Blood Gluc Sensor (FREESTYLE LIBRE 3 SENSOR) MISC Place 1 sensor on the skin every 14 days. Use to check glucose continuously (Patient not taking: Reported on 07/26/2022) 2 each 6   diazepam (VALIUM) 10 MG tablet Take 1 tablet (10 mg total) by mouth daily as needed for anxiety. (Patient not taking: Reported on 07/26/2022) 30 tablet 3   HYDROcodone-acetaminophen (NORCO/VICODIN) 5-325 MG tablet Take 1 tablet by mouth every 4 (four) hours as needed for  moderate pain. (Patient not taking: Reported on 07/26/2022) 20 tablet 0   polyethylene glycol powder (GLYCOLAX/MIRALAX) 17 GM/SCOOP powder Take 17 g by mouth daily as needed for moderate constipation. (Patient not taking: Reported on 07/26/2022)     Semaglutide, 1 MG/DOSE, 4 MG/3ML SOPN Inject 1 mg as directed once a week. (Patient not taking: Reported on 07/26/2022) 3 mL 2   No current facility-administered medications on file prior to visit.   ROS as in subjective   Objective: BP 118/70   Pulse 60   Ht  5\' 8"  (1.727 m)   Wt 171 lb 9.6 oz (77.8 kg)   BMI 26.09 kg/m   Wt Readings from Last 3 Encounters:  07/26/22 171 lb 9.6 oz (77.8 kg)  07/19/22 184 lb 4.9 oz (83.6 kg)  07/14/22 174 lb (78.9 kg)    BP Readings from Last 3 Encounters:  07/26/22 118/70  07/24/22 123/72  07/14/22 102/74   General: Well-developed well-nourished no acute distress Decreased lung sounds but relatively clear, no wheezing or rails Heart regular rate and rhythm, normal S1-S2, no obvious murmurs Extremity edema Abdomen nontender without guarding or mass     Assessment: Encounter Diagnoses  Name Primary?   Nausea and vomiting, unspecified vomiting type Yes   Gastroesophageal reflux disease without esophagitis    Chronic constipation    Type 2 diabetes mellitus without complication, unspecified whether long term insulin use    Osteomyelitis of lumbar spine    Acute cystitis with hematuria    High risk medication use    Gastritis without bleeding, unspecified chronicity, unspecified gastritis type       Plan: GERD, chronic constipation, gastritis, intractable nausea and vomiting - I reviewed recent hospitalization records, labs, imaging.  Currently her nausea is improved, currently she feels okay.  Labs today as recommended per discharge summary.  Continue Protonix which seems to be helping with the symptoms she was having that led to the hospitalization  History of labile blood pressure, history  of hypertension, but recent hypotension and fall, on midodrine since the previous hospitalization before this 1 recently.  Continue midodrine 10 mg 3 times a day.  Follow-up with cardiology as planned in the next few weeks  Continue plan to follow-up with gastroenterology given the gastric polyps, gastritis, and intractable nausea that currently is improved.  She is currently holding off on the Ozempic since the recent 2 hospitalizations.  She did well with this this past year and lost a lot of weight which was her goal but recently I was concerned about both the hypotension, nausea, overall potential side effects.  She has discontinued this for the time being.  She is awaiting pathology results from the biopsy she had on EGD in his recent hospitalization.  Follow-up with gastroenterology as planned next few weeks  Recent fluctuations in pulse-follow-up with cardiology as planned.  They will monitor blood pressures and pulse and take these readings with him to cardiology.  We discussed considering cutting back on amitriptyline as she is on the higher dose of this for sleep.  We discussed the need for consistent hydration.  Chronic constipation-no recent concerns.  Osteomyelitis of the spine-sees infectious disease, still compliant with her ongoing long-term antibiotic  Diabetes-currently off medicine since we are holding off on the GLP-1 Ozempic for now  Recent urinary tract infection seems to be resolved at this point    [facility] was seen today for hospitalization follow-up.  Diagnoses and all orders for this visit:  Nausea and vomiting, unspecified vomiting type -     Basic metabolic panel -     CBC  Gastroesophageal reflux disease without esophagitis -     Basic metabolic panel -     CBC  Chronic constipation  Type 2 diabetes mellitus without complication, unspecified whether long term insulin use  Osteomyelitis of lumbar spine  Acute cystitis with hematuria  High risk  medication use  Gastritis without bleeding, unspecified chronicity, unspecified gastritis type    Follow up: pending labs, GI follow up

## 2022-07-27 ENCOUNTER — Telehealth (INDEPENDENT_AMBULATORY_CARE_PROVIDER_SITE_OTHER): Payer: Medicare HMO | Admitting: Gastroenterology

## 2022-07-27 DIAGNOSIS — Z860100 Personal history of colon polyps, unspecified: Secondary | ICD-10-CM

## 2022-07-27 DIAGNOSIS — R112 Nausea with vomiting, unspecified: Secondary | ICD-10-CM

## 2022-07-27 DIAGNOSIS — R6881 Early satiety: Secondary | ICD-10-CM | POA: Diagnosis not present

## 2022-07-27 LAB — BASIC METABOLIC PANEL
BUN: 12 mg/dL (ref 8–27)
CO2: 24 mmol/L (ref 20–29)
Chloride: 107 mmol/L — ABNORMAL HIGH (ref 96–106)
Creatinine, Ser: 0.96 mg/dL (ref 0.57–1.00)
Potassium: 4.3 mmol/L (ref 3.5–5.2)
Sodium: 146 mmol/L — ABNORMAL HIGH (ref 134–144)

## 2022-07-27 LAB — CBC
MCHC: 34 g/dL (ref 31.5–35.7)
MCV: 96 fL (ref 79–97)
RBC: 4.06 x10E6/uL (ref 3.77–5.28)
WBC: 6.8 10*3/uL (ref 3.4–10.8)

## 2022-07-27 MED ORDER — METOCLOPRAMIDE HCL 5 MG PO TABS: 5.0000 mg | ORAL_TABLET | Freq: Three times a day (TID) | ORAL | 3 refills | Status: DC

## 2022-07-27 NOTE — Progress Notes (Signed)
Blood count was fine, electrolytes relatively okay.  Sodium was a little off but not enough to really cause problems based on your clinical appearance yesterday.  Follow-up with cardiology and gastroenterology as planned  If any new or concerning symptoms in the next week, then let me know

## 2022-07-27 NOTE — Patient Instructions (Addendum)
Stop Zofran.  We have sent the following medications to your pharmacy for you to pick up at your convenience: Reglan 5mg : Take three times a day  Continue Protonix twice a day.  Avoid Ozempic.  You have been scheduled for a follow up appointment on Tuesday, 11-01-23 at 10:30am. Please arrive 10 minutes early for registration. If you need to reschedule please call as soon as possible:  [phone].    Thank you for entrusting me with your care and for choosing [facility], Dr. Ileene Patrick  ____________________________________________________________  The [place] [facility] providers would like to encourage you to use [facility] to communicate with providers for non-urgent requests or questions.  Due to long hold times on the telephone, sending your provider a message by [facility] may be a faster and more efficient way to get a response.  Please allow 48 business hours for a response.  Please remember that this is for non-urgent requests.  ___________________________________________________________

## 2022-07-27 NOTE — Progress Notes (Signed)
Lm for patient to call back to go over chart prior to appointment with Dr Adela Lank at [facility]

## 2022-07-27 NOTE — Progress Notes (Signed)
HPI :  THIS ENCOUNTER IS A VIRTUAL VISIT. PATIENT WAS NOT SEEN IN THE OFFICE. PATIENT HAS CONSENTED TO VIRTUAL VISIT / TELEMEDICINE VISIT   Location of patient: home Location of provider: office Persons participating: myself, patient, patient's daughter Time spent on call:  40 minutes   77 year old female who I know from remote colonoscopy, on a virtual visit accompanied by her daughter today following hospitalization where she received care from our team.  Recall she had a right-sided colectomy due to large advanced polyp in the past in 2014.  I did a colonoscopy for her in 2019 and multiple adenomas were removed.  I recommended a 3-year follow-up exam.  Of note she had hypertrophied anal papillae that tested positive for AIN I had referred her to colorectal surgery at that time.  She states she thinks she saw them but does not recall her last follow-up with them.  I have not seen her since that time.  She unfortunately was admitted to the hospital from March 4 to March 13 with sepsis of unidentified etiology although tested positive for norovirus, AKI.  Recovered from that hospitalization but had been having some intermittent nausea and vomiting.  In fact states she had intermittent nausea and vomiting along with early satiety since this past November, symptoms had persisted over time.  She was remitted to the hospital from April 1 to April 6, just discharged this past week.  She was found to have a UTI and treated with antibiotics but had severe nausea and vomiting and early satiety.  She underwent an EGD with Dr. Russella Dar which showed some retained food in her stomach and some gastritis.  Biopsies of her stomach negative for H. pylori.  Polyps were hyperplastic benign.  She had a benign duodenal adenoma as well.  Of note she has been on Ozempic since 2023 and has lost significant amount of weight on the regimen.  She states she had gained weight previously from being sedentary and had chronic low  back pain.  It clearly helped her weight loss but she has not been eating well on the regimen.  She last had this prior to her hospitalization in March.  She denies any chronic narcotics at baseline.  She was given antiemetics and PPI.  She has been on Protonix twice daily since her discharge and states she has been feeling better.  She has some occasional nausea and early satiety but the vomiting is stopped.  She is tolerating p.o.  She is currently on vacation at the beach with her family.  She supposed to be taking scheduled Zofran as well but states she does not take it because it does not really help her.  She has not tried any Reglan yet.    Past Medical History:  Diagnosis Date   AIN (anal intraepithelial neoplasia) anal canal    Anemia    with pregnancy   Anxiety    Arthritis    knees, lower back, knees   Chronic constipation    Chronic diastolic CHF (congestive heart failure) 07/05/2019   CKD (chronic kidney disease)    Coarse tremors    Essential   COPD (chronic obstructive pulmonary disease)    2017 chest CT, history of   Degenerative lumbar spinal stenosis    Depression    Depression 07/05/2019   Drug dependency    Elevated PTHrP level 05/09/2019   EtOH use 07/05/2019   Pt denies heavy drinking however   GERD (gastroesophageal reflux disease)  pt denies   Gout    Grade I diastolic dysfunction 02/01/2016   Noted ECHO   Hemorrhoids    History of adenomatous polyp of colon    History of basal cell carcinoma (BCC) excision 2015   nose   History of sepsis 05/14/2014   post lumbar surgery   History of shingles    x2   Hyperlipidemia    Hypertension    Hypothyroidism    Irregular heart beat    history of while undergoing radiation   Malignant neoplasm of upper-inner quadrant of left breast in female, estrogen receptor positive oncologist-  dr Darnelle Catalan--  per lov in epic no recurrence   dx 07-13-2015  Left breast invasive ductal carcinoma, Stage IA, Grade 1 (TXN0),   09-16-2015  s/p  left breast lumpectomy with sln bx's,  completed radiation 11-18-2015,  started antiestrogen therapy 12-14-2015   Neuropathy    Left foot and back of left leg   Obesity (BMI 30-39.9) 07/05/2019   Personal history of radiation therapy    completed 11-18-2015  left breast   Pneumonia    PONV (postoperative nausea and vomiting)    Prediabetes    diet controlled, no med   Restless leg syndrome    Seasonal allergies    Seizure 05/2015   due to sepsis, only one time episode   Tremor    Wears partial dentures    lower      Past Surgical History:  Procedure Laterality Date   ABDOMINAL HYSTERECTOMY  1985   BASAL CELL CARCINOMA EXCISION  10/15   nose   BIOPSY  07/22/2022   Procedure: BIOPSY;  Surgeon: Meryl Dare, MD;  Location: [facility];  Service: Gastroenterology;;   BREAST BIOPSY Right 08/24/2015   BREAST BIOPSY Right 07/13/2015   BREAST BIOPSY Left 07/13/2015   BREAST EXCISIONAL BIOPSY Left 1972   BREAST LUMPECTOMY Left    BREAST LUMPECTOMY WITH RADIOACTIVE SEED AND SENTINEL LYMPH NODE BIOPSY Left 09/16/2015   Procedure: BREAST LUMPECTOMY WITH RADIOACTIVE SEED AND SENTINEL LYMPH NODE BIOPSY;  Surgeon: Almond Lint, MD;  Location: [facility];  Service: General;  Laterality: Left;   BREAST REDUCTION SURGERY Bilateral 1998   CATARACT EXTRACTION W/ INTRAOCULAR LENS IMPLANT Left 2016   CERVICAL SPINE SURGERY  1995   hallo   COLONOSCOPY  01/2013   polyps   ESOPHAGOGASTRODUODENOSCOPY N/A 07/22/2022   Procedure: ESOPHAGOGASTRODUODENOSCOPY (EGD);  Surgeon: Meryl Dare, MD;  Location: [facility];  Service: Gastroenterology;  Laterality: N/A;   EYE SURGERY Left 2016   HAND SURGERY Left 02-19-2002   dr Merlyn Lot  @[facility]    repair collateral ligament/ MPJ left thumb   HARDWARE REMOVAL N/A 07/24/2019   Procedure: REVISION OF HARDWARE Lumbar two - Sacral one. Extention of Fusion to Lumbar one;  Surgeon: [name], MD;  Location[facility];  Service:  Neurosurgery;  Laterality: N/A;   HEMORRHOID SURGERY  2008   HIGH RESOLUTION ANOSCOPY N/A 02/28/2018   Procedure: HIGH RESOLUTION ANOSCOPY WITH BIOPSY;  Surgeon: Romie Levee, MD;  Location: [facility];  Service: General;  Laterality: N/A;   KNEE ARTHROSCOPY Right 2002   LUMBAR SPINE SURGERY  03-20-2015   dr Conchita Paris   fusion L4-5, L5-S1   MOUTH SURGERY  07/14/2018   2 infected teeth removed   MULTIPLE TOOTH EXTRACTIONS     with bone grafting lower bottom right and left   REDUCTION MAMMAPLASTY Bilateral    RIGHT COLECTOMY  09-10-2003   dr Jamey Ripa @  [facility]   multiple colon polyps (per path tubular adenoma's, hyperplastic , benign appendix, benign two lymph nodes   ROTATOR CUFF REPAIR Left 04/2016   TONSILLECTOMY  1969   TUBAL LIGATION Bilateral 1982   Family History  Problem Relation Age of Onset   Atrial fibrillation Mother    Ulcers Father    Breast cancer Maternal Aunt    Lung cancer Maternal Uncle    Stomach cancer Maternal Grandmother    Lung cancer Maternal Aunt    Brain cancer Maternal Aunt    Prostate cancer Maternal Grandfather    Stroke Paternal Grandmother    Tuberculosis Paternal Grandfather    Colon cancer Neg Hx    Esophageal cancer Neg Hx    Rectal cancer Neg Hx    Social History   Tobacco Use   Smoking status: Former    Packs/day: 0.50    Years: 35.00    Additional pack years: 0.00    Total pack years: 17.50    Types: Cigarettes    Quit date: 05/22/2018    Years since quitting: 4.1   Smokeless tobacco: Never  Vaping Use   Vaping Use: Never used  Substance Use Topics   Alcohol use: Yes    Comment: 1 glass of wine a night   Drug use: Not Currently    Comment: CBD tincture   Current Outpatient Medications  Medication Sig Dispense Refill   metoCLOPramide (REGLAN) 5 MG tablet Take 1 tablet (5 mg total) by mouth in the morning, at noon, and at bedtime. 90 tablet 3   allopurinol (ZYLOPRIM) 300 MG tablet Take 300 mg by mouth daily.      amitriptyline (ELAVIL) 100 MG tablet Take 1 tablet (100 mg total) by mouth at bedtime. 90 tablet 0   atorvastatin (LIPITOR) 80 MG tablet Take 1 tablet (80 mg total) by mouth daily. 90 tablet 0   Biotin 3 MG TABS Take 3 mg by mouth daily.     cefadroxil (DURICEF) 500 MG capsule Take 1 capsule (500 mg total) by mouth 2 (two) times daily. 60 capsule 11   Cholecalciferol (VITAMIN D3) 50 MCG (2000 UT) capsule Take 1 capsule (2,000 Units total) by mouth daily. 90 capsule 3   colchicine 0.6 MG tablet Take 2 tablets at once then take 1 tablet an hour later then then 1 tablet twice daily x 3 days. (Patient not taking: Reported on 07/19/2022) 30 tablet 2   Continuous Blood Gluc Sensor (FREESTYLE LIBRE 3 SENSOR) MISC Place 1 sensor on the skin every 14 days. Use to check glucose continuously (Patient not taking: Reported on 07/26/2022) 2 each 6   diazepam (VALIUM) 10 MG tablet Take 1 tablet (10 mg total) by mouth daily as needed for anxiety. (Patient not taking: Reported on 07/26/2022) 30 tablet 3   DULoxetine (CYMBALTA) 60 MG capsule TAKE 1 CAPSULE(60 MG) BY MOUTH DAILY (Patient taking differently: Take 60 mg by mouth daily.) 90 capsule 0   Fluticasone-Umeclidin-Vilant ([name] ELLIPTA) 100-62.5-25 MCG/ACT AEPB Inhale 1 puff into the lungs daily. 90 each 3   HYDROcodone-acetaminophen (NORCO/VICODIN) 5-325 MG tablet Take 1 tablet by mouth every 4 (four) hours as needed for moderate pain. (Patient not taking: Reported on 07/26/2022) 20 tablet 0   levothyroxine (SYNTHROID) 100 MCG tablet Take 1 tablet (100 mcg total) by mouth daily. 30 tablet 2   Magnesium Gluconate 250 MG TABS Take 1 tablet by mouth daily.     midodrine (PROAMATINE) 10 MG tablet Take 1 tablet (10 mg  total) by mouth 3 (three) times daily with meals. 90 tablet 1   pantoprazole (PROTONIX) 40 MG tablet Take 1 tablet (40 mg total) by mouth 2 (two) times daily. 60 tablet 1   polyethylene glycol powder (GLYCOLAX/MIRALAX) 17 GM/SCOOP powder Take 17 g by mouth  daily as needed for moderate constipation. (Patient not taking: Reported on 07/26/2022)     Propylene Glycol (SYSTANE BALANCE OP) Place 1 drop into both eyes 4 (four) times daily as needed (dry eyes).     rOPINIRole (REQUIP) 1 MG tablet TAKE 1 TABLET(1 MG) BY MOUTH AT BEDTIME (Patient taking differently: Take 1 mg by mouth at bedtime.) 90 tablet 0   Semaglutide, 1 MG/DOSE, 4 MG/3ML SOPN Inject 1 mg as directed once a week. (Patient not taking: Reported on 07/26/2022) 3 mL 2   topiramate (TOPAMAX) 25 MG tablet Take 3 tablets (75 mg total) by mouth at bedtime. 90 tablet 3   No current facility-administered medications for this visit.   Allergies  Allergen Reactions   Ampicillin Hives and Other (See Comments)    Severe reaction in February 2017 1.5 month to have hives to go away   Gabapentin Swelling    UNSPECIFIED REACTION    Penicillins Hives and Other (See Comments)    Severe reaction in February 2017 1.5 month to have hives to go away Has patient had a PCN reaction causing immediate rash, facial/tongue/throat swelling, SOB or lightheadedness with hypotension: #  #  #  YES  #  #  #  Has patient had a PCN reaction causing severe rash involving mucus membranes or skin necrosis: No Has patient had a PCN reaction that required hospitalization No Has patient had a PCN reaction occurring within the last 10 years: No    Other Itching    UNSPECIFIED Analgesics     Review of Systems: All systems reviewed and negative except where noted in HPI.   Lab Results  Component Value Date   WBC 6.8 07/26/2022   HGB 13.2 07/26/2022   HCT 38.8 07/26/2022   MCV 96 07/26/2022   PLT 247 07/26/2022    Lab Results  Component Value Date   CREATININE 0.96 07/26/2022   BUN 12 07/26/2022   NA 146 (H) 07/26/2022   K 4.3 07/26/2022   CL 107 (H) 07/26/2022   CO2 24 07/26/2022    Lab Results  Component Value Date   ALT 14 07/19/2022   AST 16 07/19/2022   ALKPHOS 69 07/19/2022   BILITOT 0.8 07/19/2022      Physical Exam: There were no vitals taken for this visit. Constitutional: Pleasant,well-developed, female in no acute distress.  ASSESSMENT: 77 y.o. female here for assessment of the following  1. Nausea and vomiting, unspecified vomiting type   2. Early satiety   3. 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  Admitted in March with sepsis and norovirus, had ongoing intermittent nausea vomiting and then worsened when hospitalized in April with a UTI.  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  She is improving with conservative measures, EGD showed no concerning pathology.  CT scan showed no concerning pathology.  Discussed options moving forward.  She does not think Zofran is helping too much with her nausea, PPI appears to be helping with some of her symptoms and will continue that for now.  Discussed other options with her.  Recommend trial of  Reglan 5 mg 3 times daily to treat empirically for gastroparesis given her symptoms.  I discussed risks of this with her to include tardive dyskinesia, prolongation of QT intervals etc.  Her EKG shows a normal QTc.  On low-dose Reglan risks of this is quite low especially for short-term use.  She should try this for the next few weeks, continue to hold Ozempic, continue PPI.  Hopefully with more time she improves off Ozempic.  Otherwise overdue for surveillance colonoscopy given her history of advanced polyp and numerous adenomas on her last exam.  Will plan on seeing her in the office in 3 months for reassessment to see if she can handle colonoscopy and bowel prep at that time, I do not think is a good time to be pursuing colonoscopy while she is recovering from her recent illness.  She agrees.  PLAN: - stop Zofran - not helping - start Reglan 5mg  TID - counseled on risks of this as above - continue  protonix 40mg  BID - seems to be helping - avoid Ozempic - f/u 3 months. If doing better at that time can do colonoscopy, holding on that for now due to inability to tolerate prep in current state  Harlin RainSteve Lalisa Kiehn, MD [facility]

## 2022-07-28 ENCOUNTER — Telehealth: Payer: Self-pay | Admitting: Family Medicine

## 2022-07-28 NOTE — Telephone Encounter (Signed)
Brandi Stephens with [facility] needs verbal orders for pt.  Need 1 x week for 3 weeks nursing care for disease management and 1 prn visit.  [phone]

## 2022-07-29 NOTE — Telephone Encounter (Signed)
Left message for pt to call me back 

## 2022-07-29 NOTE — Telephone Encounter (Signed)
Pt is taking midodrine 3 times a day, protonix 2 times a day to help with nausea. Drinking water and gatorade and doing a [employer]. Her BP today has been 98/60, 119/77 and then while on the phone it was 84/73. Advised to reach out to Cardiology to inform them and if she is having dizziness or other symptoms to go to ER as her bp does not need to drop any lower. She is using a rollator walking to get around now and will stay put until her daughter is able to help her get up.

## 2022-07-29 NOTE — Telephone Encounter (Signed)
Brandi Stephens was notified of verbal orders

## 2022-07-29 NOTE — Telephone Encounter (Signed)
Spoke with Brandi Stephens from [facility]. They are suppose to go out tomorrow for PT evaluated but needed to move it to next week. She called to confirm visit and patient and daughter informed her that Pt has had a bad fall today. She fell on her left side and hip, not eating much yesterday and having GI issues (diarrhea) today. She has bruising and having to use the rollator to get around the house. She does have cardiology appt next week as she has hx of hypotenision episodes. But she wanted to inform you today of what her call consisted of. Please advise

## 2022-08-01 NOTE — Telephone Encounter (Signed)
I will be faxing over an order for PT to [facility] health services as pt is going back to the beach this weekend.   Can you please addend your notes on 07/26/22 visit for the need to PT for balance and fall prevention for insurance purposes

## 2022-08-01 NOTE — Telephone Encounter (Signed)
Pt used idomium over the weekend and it helped with her diarrhea.   Today she ate breakfast and had a vomiting fit and threw up 5-6 times.   But she states she is doing much better

## 2022-08-02 ENCOUNTER — Telehealth: Payer: Self-pay | Admitting: Medical

## 2022-08-02 ENCOUNTER — Encounter: Payer: Self-pay | Admitting: Internal Medicine

## 2022-08-02 ENCOUNTER — Ambulatory Visit: Payer: Medicare HMO | Attending: Nurse Practitioner | Admitting: Internal Medicine

## 2022-08-02 VITALS — Ht 68.0 in | Wt 179.0 lb

## 2022-08-02 DIAGNOSIS — R000 Tachycardia, unspecified: Secondary | ICD-10-CM | POA: Diagnosis not present

## 2022-08-02 DIAGNOSIS — R55 Syncope and collapse: Secondary | ICD-10-CM | POA: Diagnosis not present

## 2022-08-02 NOTE — Patient Instructions (Signed)
Medication Instructions:  The current medical regimen is effective;  continue present plan and medications.  *If you need a refill on your cardiac medications before your next appointment, please call your pharmacy*   Follow-Up: At [facility], you and your health needs are our priority.  As part of our continuing mission to provide you with exceptional heart care, we have created designated Provider Care Teams.  These Care Teams include your primary Cardiologist (physician) and Advanced Practice Providers (APPs -  Physician Assistants and Nurse Practitioners) who all work together to provide you with the care you need, when you need it.  We recommend signing up for the patient portal called "MyChart".  Sign up information is provided on this After Visit Summary.  MyChart is used to connect with patients for Virtual Visits (Telemedicine).  Patients are able to view lab/test results, encounter notes, upcoming appointments, etc.  Non-urgent messages can be sent to your provider as well.   To learn more about what you can do with MyChart, go to [email or web address].    Your next appointment:   6 month(s)  Provider:   Maisie Fus, MD

## 2022-08-02 NOTE — Telephone Encounter (Signed)
Pt stopped ozempic back in Feburary  Pt is not on a fluid pill  She states cardiology said it wasn't her heart but gastro issue. She got sick this morning but then ate some dry toast and helped. She is taking ginger chews to help with nausea. She will pick up reglan to also help with nausea.

## 2022-08-02 NOTE — Telephone Encounter (Signed)
Rayfield Citizen from [facility] called for verbal orders t# [phone] to evaluate pt this week & follow up & activity as tolerated, pt had a fall & wasn't feeling well.

## 2022-08-02 NOTE — Progress Notes (Signed)
Pt has more falls recently and is going back to the beach but feels she would benefit from a home health referral for PT to help with balance issues and fall prevention. I will place a referral to [facility] care home health.

## 2022-08-02 NOTE — Progress Notes (Signed)
Cardiology Office Note:    Date:  08/02/2022   ID:  Brandi Stephens, Mars [birth year], MRN [id number]  PCP:  Jac Canavan, PA-C   [facility] Providers Cardiologist:  Maisie Fus, MD     Referring MD: Jac Canavan, PA-C   No chief complaint on file. Hypoxia  History of Present Illness:    Brandi Stephens is a 77 y.o. female with a hx of 35 pack year smoker,  R colectomy 2/2 large polyp in 2014, COPD, severe lumbar disc degeneration s/p fusion L2-S1, T12 mild compression fraction, HTN  Seen by Dr. Rennis Golden for palpitations and chest discomfort. She had a normal nuclear stress test. Echo showed normal LV function. Cardiac monitor showed sinus tachycardia and occasional PVCs. This correlated with her symptoms. She was managed with low dose metop. She was seen earlier this year with LE edema with venous stasis managed with bumex. She also had hypoxia on O2. She was hypotensive and dizzy, her bumex was decreased to 1 mg daily from BID. She has no signs of pulmonary HTN on echo, no enlarged RV. With dyspnea, recommended PFTs in the future. She saw pulmonology. She was started on [name] in the past. She's refrained from smoking. She's had PFTs in March of 2023 c/w COPD.  She has hx of left breast CA hx of lumpectomy treated with radiation. She was on hormone therapy this was 5 years ago.   Today she presents in her wheel chair and O2. She had labs 1 month ago which showed normal renal fxn. She continue on bumex 1 mg daily. She wears compression stockings. She ambulates with a walker.  No PND or orthopnea.  No pitting edema. 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  She has persistently elevated inflammatory markers, MRI did not show new infection. She has chronic pain  Prior lexiscans 2017, 2022 were normal. Echoes have shown normal LV function.   Interim Hx 08/02/2022 On prior visit, she had a ziopatch that showed 1 triggered event with sinus  tachycardia.  She was admitted 07/18/2022 for N/V. She was orthostatic. Started on midodrine, seen by cardiology.  She has had persistent vomiting since. She was on ozempic. She stopped taking in February. She had a recent outpatient GI evaluation and there is concern for gastroparesis with ozempic. Started reglan. Today she feels week. Orthostatics are +. She notes persistent nausea and vomiting.  Past Medical History:  Diagnosis Date   AIN (anal intraepithelial neoplasia) anal canal    Anemia    with pregnancy   Anxiety    Arthritis    knees, lower back, knees   Chronic constipation    Chronic diastolic CHF (congestive heart failure) 07/05/2019   CKD (chronic kidney disease)    Coarse tremors    Essential   COPD (chronic obstructive pulmonary disease)    2017 chest CT, history of   Degenerative lumbar spinal stenosis    Depression    Depression 07/05/2019   Drug dependency    Elevated PTHrP level 05/09/2019   EtOH use 07/05/2019   Pt denies heavy drinking however   GERD (gastroesophageal reflux disease)    pt denies   Gout    Grade I diastolic dysfunction 02/01/2016   Noted ECHO   Hemorrhoids    History of adenomatous polyp of colon    History of basal cell carcinoma (BCC) excision 2015   nose   History of sepsis 05/14/2014   post lumbar surgery  History of shingles    x2   Hyperlipidemia    Hypertension    Hypothyroidism    Irregular heart beat    history of while undergoing radiation   Malignant neoplasm of upper-inner quadrant of left breast in female, estrogen receptor positive oncologist-  dr Darnelle Catalan--  per lov in epic no recurrence   dx 07-13-2015  Left breast invasive ductal carcinoma, Stage IA, Grade 1 (TXN0),  09-16-2015  s/p  left breast lumpectomy with sln bx's,  completed radiation 11-18-2015,  started antiestrogen therapy 12-14-2015   Neuropathy    Left foot and back of left leg   Obesity (BMI 30-39.9) 07/05/2019   Personal history of radiation therapy     completed 11-18-2015  left breast   Pneumonia    PONV (postoperative nausea and vomiting)    Prediabetes    diet controlled, no med   Restless leg syndrome    Seasonal allergies    Seizure 05/2015   due to sepsis, only one time episode   Tremor    Wears partial dentures    lower     Past Surgical History:  Procedure Laterality Date   ABDOMINAL HYSTERECTOMY  1985   BASAL CELL CARCINOMA EXCISION  10/15   nose   BIOPSY  07/22/2022   Procedure: BIOPSY;  Surgeon: Meryl Dare, MD;  Location: [facility];  Service: Gastroenterology;;   BREAST BIOPSY Right 08/24/2015   BREAST BIOPSY Right 07/13/2015   BREAST BIOPSY Left 07/13/2015   BREAST EXCISIONAL BIOPSY Left 1972   BREAST LUMPECTOMY Left    BREAST LUMPECTOMY WITH RADIOACTIVE SEED AND SENTINEL LYMPH NODE BIOPSY Left 09/16/2015   Procedure: BREAST LUMPECTOMY WITH RADIOACTIVE SEED AND SENTINEL LYMPH NODE BIOPSY;  Surgeon: Almond Lint, MD;  Location:  SURGERY CENTER;  Service: General;  Laterality: Left;   BREAST REDUCTION SURGERY Bilateral 1998   CATARACT EXTRACTION W/ INTRAOCULAR LENS IMPLANT Left 2016   CERVICAL SPINE SURGERY  1995   hallo   COLONOSCOPY  01/2013   polyps   ESOPHAGOGASTRODUODENOSCOPY N/A 07/22/2022   Procedure: ESOPHAGOGASTRODUODENOSCOPY (EGD);  Surgeon: Meryl Dare, MD;  Location: [facility];  Service: Gastroenterology;  Laterality: N/A;   EYE SURGERY Left 2016   HAND SURGERY Left 02-19-2002   dr Merlyn Lot     repair collateral ligament/ MPJ left thumb   HARDWARE REMOVAL N/A 07/24/2019   Procedure: REVISION OF HARDWARE Lumbar two - Sacral one. Extention of Fusion to Lumbar one;  Surgeon: [name], MD;  Location: [facility] OR;  Service: Neurosurgery;  Laterality: N/A;   HEMORRHOID SURGERY  2008   HIGH RESOLUTION ANOSCOPY N/A 02/28/2018   Procedure: HIGH RESOLUTION ANOSCOPY WITH BIOPSY;  Surgeon: Romie Levee, MD;  Location: [facility];  Service: General;  Laterality: N/A;    KNEE ARTHROSCOPY Right 2002   LUMBAR SPINE SURGERY  03-20-2015   dr Conchita Paris   fusion L4-5, L5-S1   MOUTH SURGERY  07/14/2018   2 infected teeth removed   MULTIPLE TOOTH EXTRACTIONS     with bone grafting lower bottom right and left   REDUCTION MAMMAPLASTY Bilateral    RIGHT COLECTOMY  09-10-2003   dr Jamey Ripa    multiple colon polyps (per path tubular adenoma's, hyperplastic , benign appendix, benign two lymph nodes   ROTATOR CUFF REPAIR Left 04/2016   TONSILLECTOMY  1969   TUBAL LIGATION Bilateral 1982    Current Medications: Current Meds  Medication Sig   allopurinol (ZYLOPRIM) 300 MG tablet Take 300 mg by  mouth daily.   amitriptyline (ELAVIL) 100 MG tablet Take 1 tablet (100 mg total) by mouth at bedtime.   atorvastatin (LIPITOR) 80 MG tablet Take 1 tablet (80 mg total) by mouth daily.   Biotin 3 MG TABS Take 3 mg by mouth daily.   cefadroxil (DURICEF) 500 MG capsule Take 1 capsule (500 mg total) by mouth 2 (two) times daily.   Cholecalciferol (VITAMIN D3) 50 MCG (2000 UT) capsule Take 1 capsule (2,000 Units total) by mouth daily.   colchicine 0.6 MG tablet Take 2 tablets at once then take 1 tablet an hour later then then 1 tablet twice daily x 3 days.   Continuous Blood Gluc Sensor (FREESTYLE LIBRE 3 SENSOR) MISC Place 1 sensor on the skin every 14 days. Use to check glucose continuously   diazepam (VALIUM) 10 MG tablet Take 1 tablet (10 mg total) by mouth daily as needed for anxiety.   DULoxetine (CYMBALTA) 60 MG capsule TAKE 1 CAPSULE(60 MG) BY MOUTH DAILY (Patient taking differently: Take 60 mg by mouth daily.)   Fluticasone-Umeclidin-Vilant ([name] ELLIPTA) 100-62.5-25 MCG/ACT AEPB Inhale 1 puff into the lungs daily.   HYDROcodone-acetaminophen (NORCO/VICODIN) 5-325 MG tablet Take 1 tablet by mouth every 4 (four) hours as needed for moderate pain.   levothyroxine (SYNTHROID) 100 MCG tablet Take 1 tablet (100 mcg total) by mouth daily.   Magnesium Gluconate 250 MG TABS  Take 1 tablet by mouth daily.   metoCLOPramide (REGLAN) 5 MG tablet Take 1 tablet (5 mg total) by mouth in the morning, at noon, and at bedtime.   midodrine (PROAMATINE) 10 MG tablet Take 1 tablet (10 mg total) by mouth 3 (three) times daily with meals.   pantoprazole (PROTONIX) 40 MG tablet Take 1 tablet (40 mg total) by mouth 2 (two) times daily.   polyethylene glycol powder (GLYCOLAX/MIRALAX) 17 GM/SCOOP powder Take 17 g by mouth daily as needed for moderate constipation.   Propylene Glycol (SYSTANE BALANCE OP) Place 1 drop into both eyes 4 (four) times daily as needed (dry eyes).   rOPINIRole (REQUIP) 1 MG tablet TAKE 1 TABLET(1 MG) BY MOUTH AT BEDTIME (Patient taking differently: Take 1 mg by mouth at bedtime.)   topiramate (TOPAMAX) 25 MG tablet Take 3 tablets (75 mg total) by mouth at bedtime.     Allergies:   Ampicillin, Gabapentin, Penicillins, and Other   Social History   Socioeconomic History   Marital status: Married    Spouse name: robert   Number of children: 3   Years of education: college   Highest education level: Not on file  Occupational History   Occupation: retired  Tobacco Use   Smoking status: Former    Packs/day: 0.50    Years: 35.00    Additional pack years: 0.00    Total pack years: 17.50    Types: Cigarettes    Quit date: 05/22/2018    Years since quitting: 4.2   Smokeless tobacco: Never  Vaping Use   Vaping Use: Never used  Substance and Sexual Activity   Alcohol use: Yes    Comment: 1 glass of wine a night   Drug use: Not Currently    Comment: CBD tincture   Sexual activity: Yes    Birth control/protection: Post-menopausal  Other Topics Concern   Not on file  Social History Narrative   Lives with husband in a 2 story home.  Has 2 living children.  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  Education: college.  Right-handed.   Five cups coffee per week.   Social Determinants of Health   Financial Resource Strain: Low Risk  (03/18/2022)    Overall Financial Resource Strain (CARDIA)    Difficulty of Paying Living Expenses: Not hard at all  Food Insecurity: No Food Insecurity (07/18/2022)   Hunger Vital Sign    Worried About Running Out of Food in the Last Year: Never true    Ran Out of Food in the Last Year: Never true  Transportation Needs: No Transportation Needs (07/18/2022)   PRAPARE - Administrator, Civil Service (Medical): No    Lack of Transportation (Non-Medical): No  Physical Activity: Inactive (03/18/2022)   Exercise Vital Sign    Days of Exercise per Week: 0 days    Minutes of Exercise per Session: 0 min  Stress: No Stress Concern Present (03/18/2022)   [employer] of Occupational Health - Occupational Stress Questionnaire    Feeling of Stress : Only a little  Social Connections: Not on file     Family History: The patient's family history includes Atrial fibrillation in her mother; Brain cancer in her maternal aunt; Breast cancer in her maternal aunt; Lung cancer in her maternal aunt and maternal uncle; Prostate cancer in her maternal grandfather; Stomach cancer in her maternal grandmother; Stroke in her paternal grandmother; Tuberculosis in her paternal grandfather; Ulcers in her father. There is no history of Colon cancer, Esophageal cancer, or Rectal cancer.  ROS:   Please see the history of present illness.     All other systems reviewed and are negative.  EKGs/Labs/Other Studies Reviewed:    The following studies were reviewed today:   EKG:  EKG is  ordered today.  The ekg ordered today demonstrates   Sinus tachycardia HR 122 bpm  Recent Labs: 07/19/2022: ALT 14; B Natriuretic Peptide 122.2 07/20/2022: TSH 3.158 07/23/2022: Magnesium 1.9 07/26/2022: BUN 12; Creatinine, Ser 0.96; Hemoglobin 13.2; Platelets 247; Potassium 4.3; Sodium 146   Recent Lipid Panel    Component Value Date/Time   CHOL 151 04/07/2022 0936   TRIG 183 (H) 04/07/2022 0936   HDL 54 04/07/2022 0936   CHOLHDL 2.8  04/07/2022 0936   CHOLHDL 4.2 07/08/2016 1042   VLDL 52 (H) 07/08/2016 1042   LDLCALC 67 04/07/2022 0936     Risk Assessment/Calculations:           Physical Exam:    VS:   Vitals:   08/02/22 1142 08/02/22 1143  SpO2: 91% 95%     Wt Readings from Last 3 Encounters:  08/02/22 179 lb (81.2 kg)  07/26/22 171 lb 9.6 oz (77.8 kg)  07/19/22 184 lb 4.9 oz (83.6 kg)     GEN:  Well nourished, well developed in no acute distress HEENT: Normal NECK: No JVD; No carotid bruits LYMPHATICS: No lymphadenopathy CARDIAC: RRR, no murmurs, rubs, gallops RESPIRATORY: nasal cannula in place,  no rales ABDOMEN: Soft, non-tender, non-distended MUSCULOSKELETAL:  No pitting edema; No deformity  SKIN: Warm and dry NEUROLOGIC:  Alert and oriented x 3 PSYCHIATRIC:  Normal affect   ASSESSMENT:    Syncope: had prior + orthostasis in the setting of N/V. Zio shows sinus tachycardia. In the setting of dehydration with nausea and vomiting. Recommend hydration with electrolytes.  On midodrine 10 mg TID. Plan to continue until her n/v resolves off of ozempic  Thyroid disease: was 4.8 3 months ago, 0.271 05/2022, 20 06/24/2022, TSH normal 07/20/2022. 3.158  ?Diastolic CHF: No hospitalizations recently. For this, GFR  is normal. Do not think she has HFpEF, there was question before with E/e' that was noted to be elevated but on repeat study < 10. She has remained euvolemic. BNPs have been normal. She has lost weight.    PLAN:    In order of problems listed above:  Recommend continued GI follow up Follow up 6 months ( can assess need of further midodrine, discussed if having less N/V can reduce the dose)        Medication Adjustments/Labs and Tests Ordered: Current medicines are reviewed at length with the patient today.  Concerns regarding medicines are outlined above.  No orders of the defined types were placed in this encounter.  No orders of the defined types were placed in this  encounter.   Patient Instructions  Medication Instructions:  The current medical regimen is effective;  continue present plan and medications.  *If you need a refill on your cardiac medications before your next appointment, please call your pharmacy*   Follow-Up: At [facility], you and your health needs are our priority.  As part of our continuing mission to provide you with exceptional heart care, we have created designated Provider Care Teams.  These Care Teams include your primary Cardiologist (physician) and Advanced Practice Providers (APPs -  Physician Assistants and Nurse Practitioners) who all work together to provide you with the care you need, when you need it.  We recommend signing up for the patient portal called "MyChart".  Sign up information is provided on this After Visit Summary.  MyChart is used to connect with patients for Virtual Visits (Telemedicine).  Patients are able to view lab/test results, encounter notes, upcoming appointments, etc.  Non-urgent messages can be sent to your provider as well.   To learn more about what you can do with MyChart, go to [email or web address].    Your next appointment:   6 month(s)  Provider:   Maisie Fus, MD       Signed, Maisie Fus, MD  08/02/2022 4:43 PM    [facility]

## 2022-08-03 DIAGNOSIS — F32A Depression, unspecified: Secondary | ICD-10-CM | POA: Diagnosis not present

## 2022-08-03 DIAGNOSIS — E1122 Type 2 diabetes mellitus with diabetic chronic kidney disease: Secondary | ICD-10-CM | POA: Diagnosis not present

## 2022-08-03 DIAGNOSIS — N390 Urinary tract infection, site not specified: Secondary | ICD-10-CM | POA: Diagnosis not present

## 2022-08-03 DIAGNOSIS — E669 Obesity, unspecified: Secondary | ICD-10-CM | POA: Diagnosis not present

## 2022-08-03 DIAGNOSIS — M1030 Gout due to renal impairment, unspecified site: Secondary | ICD-10-CM | POA: Diagnosis not present

## 2022-08-03 DIAGNOSIS — K5900 Constipation, unspecified: Secondary | ICD-10-CM | POA: Diagnosis not present

## 2022-08-03 DIAGNOSIS — E1140 Type 2 diabetes mellitus with diabetic neuropathy, unspecified: Secondary | ICD-10-CM | POA: Diagnosis not present

## 2022-08-03 DIAGNOSIS — B9561 Methicillin susceptible Staphylococcus aureus infection as the cause of diseases classified elsewhere: Secondary | ICD-10-CM | POA: Diagnosis not present

## 2022-08-03 DIAGNOSIS — F419 Anxiety disorder, unspecified: Secondary | ICD-10-CM | POA: Diagnosis not present

## 2022-08-03 DIAGNOSIS — K2970 Gastritis, unspecified, without bleeding: Secondary | ICD-10-CM | POA: Diagnosis not present

## 2022-08-03 DIAGNOSIS — E039 Hypothyroidism, unspecified: Secondary | ICD-10-CM | POA: Diagnosis not present

## 2022-08-03 DIAGNOSIS — M4626 Osteomyelitis of vertebra, lumbar region: Secondary | ICD-10-CM | POA: Diagnosis not present

## 2022-08-03 DIAGNOSIS — N189 Chronic kidney disease, unspecified: Secondary | ICD-10-CM | POA: Diagnosis not present

## 2022-08-03 DIAGNOSIS — R7881 Bacteremia: Secondary | ICD-10-CM | POA: Diagnosis not present

## 2022-08-03 DIAGNOSIS — Z7985 Long-term (current) use of injectable non-insulin antidiabetic drugs: Secondary | ICD-10-CM | POA: Diagnosis not present

## 2022-08-03 DIAGNOSIS — E785 Hyperlipidemia, unspecified: Secondary | ICD-10-CM | POA: Diagnosis not present

## 2022-08-03 DIAGNOSIS — I130 Hypertensive heart and chronic kidney disease with heart failure and stage 1 through stage 4 chronic kidney disease, or unspecified chronic kidney disease: Secondary | ICD-10-CM | POA: Diagnosis not present

## 2022-08-03 DIAGNOSIS — K219 Gastro-esophageal reflux disease without esophagitis: Secondary | ICD-10-CM | POA: Diagnosis not present

## 2022-08-03 DIAGNOSIS — J449 Chronic obstructive pulmonary disease, unspecified: Secondary | ICD-10-CM | POA: Diagnosis not present

## 2022-08-03 DIAGNOSIS — I5032 Chronic diastolic (congestive) heart failure: Secondary | ICD-10-CM | POA: Diagnosis not present

## 2022-08-03 DIAGNOSIS — G2581 Restless legs syndrome: Secondary | ICD-10-CM | POA: Diagnosis not present

## 2022-08-03 DIAGNOSIS — M48061 Spinal stenosis, lumbar region without neurogenic claudication: Secondary | ICD-10-CM | POA: Diagnosis not present

## 2022-08-03 DIAGNOSIS — D631 Anemia in chronic kidney disease: Secondary | ICD-10-CM | POA: Diagnosis not present

## 2022-08-03 DIAGNOSIS — I951 Orthostatic hypotension: Secondary | ICD-10-CM | POA: Diagnosis not present

## 2022-08-04 DIAGNOSIS — J449 Chronic obstructive pulmonary disease, unspecified: Secondary | ICD-10-CM | POA: Diagnosis not present

## 2022-08-04 DIAGNOSIS — I951 Orthostatic hypotension: Secondary | ICD-10-CM | POA: Diagnosis not present

## 2022-08-04 DIAGNOSIS — K5900 Constipation, unspecified: Secondary | ICD-10-CM | POA: Diagnosis not present

## 2022-08-04 DIAGNOSIS — N390 Urinary tract infection, site not specified: Secondary | ICD-10-CM | POA: Diagnosis not present

## 2022-08-04 DIAGNOSIS — B9561 Methicillin susceptible Staphylococcus aureus infection as the cause of diseases classified elsewhere: Secondary | ICD-10-CM | POA: Diagnosis not present

## 2022-08-04 DIAGNOSIS — E669 Obesity, unspecified: Secondary | ICD-10-CM | POA: Diagnosis not present

## 2022-08-04 DIAGNOSIS — M48061 Spinal stenosis, lumbar region without neurogenic claudication: Secondary | ICD-10-CM | POA: Diagnosis not present

## 2022-08-04 DIAGNOSIS — F32A Depression, unspecified: Secondary | ICD-10-CM | POA: Diagnosis not present

## 2022-08-04 DIAGNOSIS — G2581 Restless legs syndrome: Secondary | ICD-10-CM | POA: Diagnosis not present

## 2022-08-04 DIAGNOSIS — M1030 Gout due to renal impairment, unspecified site: Secondary | ICD-10-CM | POA: Diagnosis not present

## 2022-08-04 DIAGNOSIS — F419 Anxiety disorder, unspecified: Secondary | ICD-10-CM | POA: Diagnosis not present

## 2022-08-04 DIAGNOSIS — D631 Anemia in chronic kidney disease: Secondary | ICD-10-CM | POA: Diagnosis not present

## 2022-08-04 DIAGNOSIS — Z7985 Long-term (current) use of injectable non-insulin antidiabetic drugs: Secondary | ICD-10-CM | POA: Diagnosis not present

## 2022-08-04 DIAGNOSIS — I5032 Chronic diastolic (congestive) heart failure: Secondary | ICD-10-CM | POA: Diagnosis not present

## 2022-08-04 DIAGNOSIS — E1140 Type 2 diabetes mellitus with diabetic neuropathy, unspecified: Secondary | ICD-10-CM | POA: Diagnosis not present

## 2022-08-04 DIAGNOSIS — I130 Hypertensive heart and chronic kidney disease with heart failure and stage 1 through stage 4 chronic kidney disease, or unspecified chronic kidney disease: Secondary | ICD-10-CM | POA: Diagnosis not present

## 2022-08-04 DIAGNOSIS — K219 Gastro-esophageal reflux disease without esophagitis: Secondary | ICD-10-CM | POA: Diagnosis not present

## 2022-08-04 DIAGNOSIS — E785 Hyperlipidemia, unspecified: Secondary | ICD-10-CM | POA: Diagnosis not present

## 2022-08-04 DIAGNOSIS — M4626 Osteomyelitis of vertebra, lumbar region: Secondary | ICD-10-CM | POA: Diagnosis not present

## 2022-08-04 DIAGNOSIS — E1122 Type 2 diabetes mellitus with diabetic chronic kidney disease: Secondary | ICD-10-CM | POA: Diagnosis not present

## 2022-08-04 DIAGNOSIS — R7881 Bacteremia: Secondary | ICD-10-CM | POA: Diagnosis not present

## 2022-08-04 DIAGNOSIS — K2970 Gastritis, unspecified, without bleeding: Secondary | ICD-10-CM | POA: Diagnosis not present

## 2022-08-04 DIAGNOSIS — E039 Hypothyroidism, unspecified: Secondary | ICD-10-CM | POA: Diagnosis not present

## 2022-08-04 DIAGNOSIS — N189 Chronic kidney disease, unspecified: Secondary | ICD-10-CM | POA: Diagnosis not present

## 2022-08-05 DIAGNOSIS — N189 Chronic kidney disease, unspecified: Secondary | ICD-10-CM | POA: Diagnosis not present

## 2022-08-05 DIAGNOSIS — J449 Chronic obstructive pulmonary disease, unspecified: Secondary | ICD-10-CM | POA: Diagnosis not present

## 2022-08-05 DIAGNOSIS — E1140 Type 2 diabetes mellitus with diabetic neuropathy, unspecified: Secondary | ICD-10-CM | POA: Diagnosis not present

## 2022-08-05 DIAGNOSIS — M1030 Gout due to renal impairment, unspecified site: Secondary | ICD-10-CM | POA: Diagnosis not present

## 2022-08-05 DIAGNOSIS — M48061 Spinal stenosis, lumbar region without neurogenic claudication: Secondary | ICD-10-CM | POA: Diagnosis not present

## 2022-08-05 DIAGNOSIS — F419 Anxiety disorder, unspecified: Secondary | ICD-10-CM | POA: Diagnosis not present

## 2022-08-05 DIAGNOSIS — E039 Hypothyroidism, unspecified: Secondary | ICD-10-CM | POA: Diagnosis not present

## 2022-08-05 DIAGNOSIS — Z7985 Long-term (current) use of injectable non-insulin antidiabetic drugs: Secondary | ICD-10-CM | POA: Diagnosis not present

## 2022-08-05 DIAGNOSIS — N390 Urinary tract infection, site not specified: Secondary | ICD-10-CM | POA: Diagnosis not present

## 2022-08-05 DIAGNOSIS — I130 Hypertensive heart and chronic kidney disease with heart failure and stage 1 through stage 4 chronic kidney disease, or unspecified chronic kidney disease: Secondary | ICD-10-CM | POA: Diagnosis not present

## 2022-08-05 DIAGNOSIS — E669 Obesity, unspecified: Secondary | ICD-10-CM | POA: Diagnosis not present

## 2022-08-05 DIAGNOSIS — E1122 Type 2 diabetes mellitus with diabetic chronic kidney disease: Secondary | ICD-10-CM | POA: Diagnosis not present

## 2022-08-05 DIAGNOSIS — D631 Anemia in chronic kidney disease: Secondary | ICD-10-CM | POA: Diagnosis not present

## 2022-08-05 DIAGNOSIS — E785 Hyperlipidemia, unspecified: Secondary | ICD-10-CM | POA: Diagnosis not present

## 2022-08-05 DIAGNOSIS — I951 Orthostatic hypotension: Secondary | ICD-10-CM | POA: Diagnosis not present

## 2022-08-05 DIAGNOSIS — B9561 Methicillin susceptible Staphylococcus aureus infection as the cause of diseases classified elsewhere: Secondary | ICD-10-CM | POA: Diagnosis not present

## 2022-08-05 DIAGNOSIS — I5032 Chronic diastolic (congestive) heart failure: Secondary | ICD-10-CM | POA: Diagnosis not present

## 2022-08-05 DIAGNOSIS — F32A Depression, unspecified: Secondary | ICD-10-CM | POA: Diagnosis not present

## 2022-08-05 DIAGNOSIS — K2970 Gastritis, unspecified, without bleeding: Secondary | ICD-10-CM | POA: Diagnosis not present

## 2022-08-05 DIAGNOSIS — K5900 Constipation, unspecified: Secondary | ICD-10-CM | POA: Diagnosis not present

## 2022-08-05 DIAGNOSIS — G2581 Restless legs syndrome: Secondary | ICD-10-CM | POA: Diagnosis not present

## 2022-08-05 DIAGNOSIS — M4626 Osteomyelitis of vertebra, lumbar region: Secondary | ICD-10-CM | POA: Diagnosis not present

## 2022-08-05 DIAGNOSIS — R7881 Bacteremia: Secondary | ICD-10-CM | POA: Diagnosis not present

## 2022-08-05 DIAGNOSIS — K219 Gastro-esophageal reflux disease without esophagitis: Secondary | ICD-10-CM | POA: Diagnosis not present

## 2022-08-09 ENCOUNTER — Other Ambulatory Visit: Payer: Self-pay | Admitting: Internal Medicine

## 2022-08-09 ENCOUNTER — Ambulatory Visit: Payer: Medicare HMO | Admitting: Physical Medicine and Rehabilitation

## 2022-08-09 DIAGNOSIS — K2970 Gastritis, unspecified, without bleeding: Secondary | ICD-10-CM

## 2022-08-09 DIAGNOSIS — I5032 Chronic diastolic (congestive) heart failure: Secondary | ICD-10-CM

## 2022-08-09 DIAGNOSIS — Z79899 Other long term (current) drug therapy: Secondary | ICD-10-CM

## 2022-08-09 DIAGNOSIS — R296 Repeated falls: Secondary | ICD-10-CM

## 2022-08-09 DIAGNOSIS — I951 Orthostatic hypotension: Secondary | ICD-10-CM

## 2022-08-09 NOTE — Progress Notes (Unsigned)
[place] [facility] [place] PT for this. I have put in order for this

## 2022-08-11 DIAGNOSIS — R1310 Dysphagia, unspecified: Secondary | ICD-10-CM | POA: Diagnosis not present

## 2022-08-11 DIAGNOSIS — K5790 Diverticulosis of intestine, part unspecified, without perforation or abscess without bleeding: Secondary | ICD-10-CM | POA: Diagnosis not present

## 2022-08-11 DIAGNOSIS — E1122 Type 2 diabetes mellitus with diabetic chronic kidney disease: Secondary | ICD-10-CM | POA: Diagnosis not present

## 2022-08-11 DIAGNOSIS — E669 Obesity, unspecified: Secondary | ICD-10-CM | POA: Diagnosis not present

## 2022-08-11 DIAGNOSIS — F32A Depression, unspecified: Secondary | ICD-10-CM | POA: Diagnosis not present

## 2022-08-11 DIAGNOSIS — G252 Other specified forms of tremor: Secondary | ICD-10-CM | POA: Diagnosis not present

## 2022-08-11 DIAGNOSIS — M4626 Osteomyelitis of vertebra, lumbar region: Secondary | ICD-10-CM | POA: Diagnosis not present

## 2022-08-11 DIAGNOSIS — I872 Venous insufficiency (chronic) (peripheral): Secondary | ICD-10-CM | POA: Diagnosis not present

## 2022-08-11 DIAGNOSIS — J449 Chronic obstructive pulmonary disease, unspecified: Secondary | ICD-10-CM | POA: Diagnosis not present

## 2022-08-11 DIAGNOSIS — I951 Orthostatic hypotension: Secondary | ICD-10-CM | POA: Diagnosis not present

## 2022-08-11 DIAGNOSIS — M170 Bilateral primary osteoarthritis of knee: Secondary | ICD-10-CM | POA: Diagnosis not present

## 2022-08-11 DIAGNOSIS — N183 Chronic kidney disease, stage 3 unspecified: Secondary | ICD-10-CM | POA: Diagnosis not present

## 2022-08-11 DIAGNOSIS — F419 Anxiety disorder, unspecified: Secondary | ICD-10-CM | POA: Diagnosis not present

## 2022-08-11 DIAGNOSIS — I5032 Chronic diastolic (congestive) heart failure: Secondary | ICD-10-CM | POA: Diagnosis not present

## 2022-08-11 DIAGNOSIS — E785 Hyperlipidemia, unspecified: Secondary | ICD-10-CM | POA: Diagnosis not present

## 2022-08-11 DIAGNOSIS — K219 Gastro-esophageal reflux disease without esophagitis: Secondary | ICD-10-CM | POA: Diagnosis not present

## 2022-08-11 DIAGNOSIS — E1140 Type 2 diabetes mellitus with diabetic neuropathy, unspecified: Secondary | ICD-10-CM | POA: Diagnosis not present

## 2022-08-11 DIAGNOSIS — G4700 Insomnia, unspecified: Secondary | ICD-10-CM | POA: Diagnosis not present

## 2022-08-11 DIAGNOSIS — E1151 Type 2 diabetes mellitus with diabetic peripheral angiopathy without gangrene: Secondary | ICD-10-CM | POA: Diagnosis not present

## 2022-08-11 DIAGNOSIS — M109 Gout, unspecified: Secondary | ICD-10-CM | POA: Diagnosis not present

## 2022-08-11 DIAGNOSIS — D631 Anemia in chronic kidney disease: Secondary | ICD-10-CM | POA: Diagnosis not present

## 2022-08-11 DIAGNOSIS — M48061 Spinal stenosis, lumbar region without neurogenic claudication: Secondary | ICD-10-CM | POA: Diagnosis not present

## 2022-08-11 DIAGNOSIS — E039 Hypothyroidism, unspecified: Secondary | ICD-10-CM | POA: Diagnosis not present

## 2022-08-11 DIAGNOSIS — I130 Hypertensive heart and chronic kidney disease with heart failure and stage 1 through stage 4 chronic kidney disease, or unspecified chronic kidney disease: Secondary | ICD-10-CM | POA: Diagnosis not present

## 2022-08-11 DIAGNOSIS — G2581 Restless legs syndrome: Secondary | ICD-10-CM | POA: Diagnosis not present

## 2022-08-12 ENCOUNTER — Other Ambulatory Visit: Payer: Self-pay | Admitting: Medical

## 2022-08-12 ENCOUNTER — Telehealth: Payer: Self-pay | Admitting: *Deleted

## 2022-08-12 MED ORDER — CIPROFLOXACIN HCL 500 MG PO TABS: 500.0000 mg | ORAL_TABLET | Freq: Two times a day (BID) | ORAL | 0 refills | Status: DC

## 2022-08-12 MED ORDER — CIPROFLOXACIN HCL 500 MG PO TABS: 500.0000 mg | ORAL_TABLET | Freq: Two times a day (BID) | ORAL | 0 refills | Status: AC

## 2022-08-12 NOTE — Telephone Encounter (Signed)
Patient called she is at her home in [place]. She said he Home Health nurse was supposed to contact you (I do not see anything in chart) about a UTI that she has. She has urinary frequency, odor and urine is very dark in color. She is asking for you to please call her in something.

## 2022-08-12 NOTE — Telephone Encounter (Signed)
Sherry a [facility] nurse called and states she left a vm on Sabrina's phone but no one got back with her. She is asking for approval for home health for [facility] and said she will fax over a 458 form to be completed. She said Rozlynn has called her a few times today about a uti that she was seen for at the first of April. She is still having uti symptoms and was wondering if you can send her in something or would you like her to go to ED or urgent care?  Provided call back number [phone]

## 2022-08-15 ENCOUNTER — Telehealth: Payer: Self-pay | Admitting: Internal Medicine

## 2022-08-15 NOTE — Telephone Encounter (Signed)
Called pt and she has picked up Rx Cipro & has been taking it & has 1 pill left said today urine still dark yellow with green tint,  she will call back tomorrow if no better.  Advised to continue meds & drink plenty of water

## 2022-08-15 NOTE — Telephone Encounter (Signed)
Called pt about Pt Assistance application,  still need income statement & Rx print out of $600 paid out of pocket.  She is not able to take care of this right now so I asked if she wanted me to call her daughter Carollee Herter & she said yes.  I called Carollee Herter & she states mom has 3 unopened [name] right now & she doesn't think she has spent out $600 as of yet but she will let us know if she needs this later

## 2022-08-15 NOTE — Telephone Encounter (Signed)
Pt was notified and will see how she feels tomorrow to see if she needs to be seen

## 2022-08-15 NOTE — Telephone Encounter (Signed)
Pt called today and said that she took all 6 pills of her cipro but is still having red urine coming out with greenish tint. She wants to know if there is something else she can do

## 2022-08-22 ENCOUNTER — Encounter: Payer: Self-pay | Admitting: Internal Medicine

## 2022-08-22 NOTE — Telephone Encounter (Signed)
I received a home health medication interaction report regarding midodrine and amitriptyline.  Since it appears that you will be on midodrine for the time being, we need to cut back the dose of amitriptyline.  If you are taking this every day I recommend cutting it in half and taking 1/2 tablet daily.  If you are not taking it every day, I would still recommend using a half dose if you do take it occasionally.

## 2022-08-23 DIAGNOSIS — G252 Other specified forms of tremor: Secondary | ICD-10-CM | POA: Diagnosis not present

## 2022-08-23 DIAGNOSIS — N183 Chronic kidney disease, stage 3 unspecified: Secondary | ICD-10-CM | POA: Diagnosis not present

## 2022-08-23 DIAGNOSIS — F32A Depression, unspecified: Secondary | ICD-10-CM | POA: Diagnosis not present

## 2022-08-23 DIAGNOSIS — K5790 Diverticulosis of intestine, part unspecified, without perforation or abscess without bleeding: Secondary | ICD-10-CM | POA: Diagnosis not present

## 2022-08-23 DIAGNOSIS — E1122 Type 2 diabetes mellitus with diabetic chronic kidney disease: Secondary | ICD-10-CM | POA: Diagnosis not present

## 2022-08-23 DIAGNOSIS — I5032 Chronic diastolic (congestive) heart failure: Secondary | ICD-10-CM | POA: Diagnosis not present

## 2022-08-23 DIAGNOSIS — R1310 Dysphagia, unspecified: Secondary | ICD-10-CM | POA: Diagnosis not present

## 2022-08-23 DIAGNOSIS — E1151 Type 2 diabetes mellitus with diabetic peripheral angiopathy without gangrene: Secondary | ICD-10-CM | POA: Diagnosis not present

## 2022-08-23 DIAGNOSIS — J449 Chronic obstructive pulmonary disease, unspecified: Secondary | ICD-10-CM | POA: Diagnosis not present

## 2022-08-23 DIAGNOSIS — E1140 Type 2 diabetes mellitus with diabetic neuropathy, unspecified: Secondary | ICD-10-CM | POA: Diagnosis not present

## 2022-08-23 DIAGNOSIS — F419 Anxiety disorder, unspecified: Secondary | ICD-10-CM | POA: Diagnosis not present

## 2022-08-23 DIAGNOSIS — I951 Orthostatic hypotension: Secondary | ICD-10-CM | POA: Diagnosis not present

## 2022-08-23 DIAGNOSIS — E669 Obesity, unspecified: Secondary | ICD-10-CM | POA: Diagnosis not present

## 2022-08-23 DIAGNOSIS — M109 Gout, unspecified: Secondary | ICD-10-CM | POA: Diagnosis not present

## 2022-08-23 DIAGNOSIS — G2581 Restless legs syndrome: Secondary | ICD-10-CM | POA: Diagnosis not present

## 2022-08-23 DIAGNOSIS — I872 Venous insufficiency (chronic) (peripheral): Secondary | ICD-10-CM | POA: Diagnosis not present

## 2022-08-23 DIAGNOSIS — E785 Hyperlipidemia, unspecified: Secondary | ICD-10-CM | POA: Diagnosis not present

## 2022-08-23 DIAGNOSIS — G4700 Insomnia, unspecified: Secondary | ICD-10-CM | POA: Diagnosis not present

## 2022-08-23 DIAGNOSIS — I130 Hypertensive heart and chronic kidney disease with heart failure and stage 1 through stage 4 chronic kidney disease, or unspecified chronic kidney disease: Secondary | ICD-10-CM | POA: Diagnosis not present

## 2022-08-23 DIAGNOSIS — M170 Bilateral primary osteoarthritis of knee: Secondary | ICD-10-CM | POA: Diagnosis not present

## 2022-08-23 DIAGNOSIS — K219 Gastro-esophageal reflux disease without esophagitis: Secondary | ICD-10-CM | POA: Diagnosis not present

## 2022-08-23 DIAGNOSIS — D631 Anemia in chronic kidney disease: Secondary | ICD-10-CM | POA: Diagnosis not present

## 2022-08-23 DIAGNOSIS — E039 Hypothyroidism, unspecified: Secondary | ICD-10-CM | POA: Diagnosis not present

## 2022-08-23 DIAGNOSIS — M4626 Osteomyelitis of vertebra, lumbar region: Secondary | ICD-10-CM | POA: Diagnosis not present

## 2022-08-23 DIAGNOSIS — M48061 Spinal stenosis, lumbar region without neurogenic claudication: Secondary | ICD-10-CM | POA: Diagnosis not present

## 2022-08-24 ENCOUNTER — Other Ambulatory Visit: Payer: Self-pay | Admitting: Medical

## 2022-08-25 NOTE — Telephone Encounter (Signed)
Pt Assistance Ozempic received,  called pt & informed

## 2022-08-29 ENCOUNTER — Telehealth: Payer: Self-pay | Admitting: Medical

## 2022-08-29 NOTE — Telephone Encounter (Signed)
Called and left detailed vm on kim phone ok for OT

## 2022-08-29 NOTE — Telephone Encounter (Signed)
[facility] called and is requesting verbal orders for the pt to be elevated for OT this week  shw was not able to be last week  She can be reached at [phone]

## 2022-08-31 DIAGNOSIS — G4700 Insomnia, unspecified: Secondary | ICD-10-CM | POA: Diagnosis not present

## 2022-08-31 DIAGNOSIS — J449 Chronic obstructive pulmonary disease, unspecified: Secondary | ICD-10-CM | POA: Diagnosis not present

## 2022-08-31 DIAGNOSIS — E039 Hypothyroidism, unspecified: Secondary | ICD-10-CM | POA: Diagnosis not present

## 2022-08-31 DIAGNOSIS — I5032 Chronic diastolic (congestive) heart failure: Secondary | ICD-10-CM | POA: Diagnosis not present

## 2022-08-31 DIAGNOSIS — M48061 Spinal stenosis, lumbar region without neurogenic claudication: Secondary | ICD-10-CM | POA: Diagnosis not present

## 2022-08-31 DIAGNOSIS — E1122 Type 2 diabetes mellitus with diabetic chronic kidney disease: Secondary | ICD-10-CM | POA: Diagnosis not present

## 2022-08-31 DIAGNOSIS — E1151 Type 2 diabetes mellitus with diabetic peripheral angiopathy without gangrene: Secondary | ICD-10-CM | POA: Diagnosis not present

## 2022-08-31 DIAGNOSIS — I951 Orthostatic hypotension: Secondary | ICD-10-CM | POA: Diagnosis not present

## 2022-08-31 DIAGNOSIS — E785 Hyperlipidemia, unspecified: Secondary | ICD-10-CM | POA: Diagnosis not present

## 2022-08-31 DIAGNOSIS — F32A Depression, unspecified: Secondary | ICD-10-CM | POA: Diagnosis not present

## 2022-08-31 DIAGNOSIS — G252 Other specified forms of tremor: Secondary | ICD-10-CM | POA: Diagnosis not present

## 2022-08-31 DIAGNOSIS — I130 Hypertensive heart and chronic kidney disease with heart failure and stage 1 through stage 4 chronic kidney disease, or unspecified chronic kidney disease: Secondary | ICD-10-CM | POA: Diagnosis not present

## 2022-08-31 DIAGNOSIS — I872 Venous insufficiency (chronic) (peripheral): Secondary | ICD-10-CM | POA: Diagnosis not present

## 2022-08-31 DIAGNOSIS — K5790 Diverticulosis of intestine, part unspecified, without perforation or abscess without bleeding: Secondary | ICD-10-CM | POA: Diagnosis not present

## 2022-08-31 DIAGNOSIS — E669 Obesity, unspecified: Secondary | ICD-10-CM | POA: Diagnosis not present

## 2022-08-31 DIAGNOSIS — M170 Bilateral primary osteoarthritis of knee: Secondary | ICD-10-CM | POA: Diagnosis not present

## 2022-08-31 DIAGNOSIS — F419 Anxiety disorder, unspecified: Secondary | ICD-10-CM | POA: Diagnosis not present

## 2022-08-31 DIAGNOSIS — M109 Gout, unspecified: Secondary | ICD-10-CM | POA: Diagnosis not present

## 2022-08-31 DIAGNOSIS — N183 Chronic kidney disease, stage 3 unspecified: Secondary | ICD-10-CM | POA: Diagnosis not present

## 2022-08-31 DIAGNOSIS — R1310 Dysphagia, unspecified: Secondary | ICD-10-CM | POA: Diagnosis not present

## 2022-08-31 DIAGNOSIS — G2581 Restless legs syndrome: Secondary | ICD-10-CM | POA: Diagnosis not present

## 2022-08-31 DIAGNOSIS — M4626 Osteomyelitis of vertebra, lumbar region: Secondary | ICD-10-CM | POA: Diagnosis not present

## 2022-08-31 DIAGNOSIS — E1140 Type 2 diabetes mellitus with diabetic neuropathy, unspecified: Secondary | ICD-10-CM | POA: Diagnosis not present

## 2022-08-31 DIAGNOSIS — K219 Gastro-esophageal reflux disease without esophagitis: Secondary | ICD-10-CM | POA: Diagnosis not present

## 2022-08-31 DIAGNOSIS — D631 Anemia in chronic kidney disease: Secondary | ICD-10-CM | POA: Diagnosis not present

## 2022-09-01 ENCOUNTER — Telehealth: Payer: Self-pay

## 2022-09-01 DIAGNOSIS — F32A Depression, unspecified: Secondary | ICD-10-CM | POA: Diagnosis not present

## 2022-09-01 DIAGNOSIS — E1140 Type 2 diabetes mellitus with diabetic neuropathy, unspecified: Secondary | ICD-10-CM | POA: Diagnosis not present

## 2022-09-01 DIAGNOSIS — M48061 Spinal stenosis, lumbar region without neurogenic claudication: Secondary | ICD-10-CM | POA: Diagnosis not present

## 2022-09-01 DIAGNOSIS — M170 Bilateral primary osteoarthritis of knee: Secondary | ICD-10-CM | POA: Diagnosis not present

## 2022-09-01 DIAGNOSIS — E039 Hypothyroidism, unspecified: Secondary | ICD-10-CM | POA: Diagnosis not present

## 2022-09-01 DIAGNOSIS — M4626 Osteomyelitis of vertebra, lumbar region: Secondary | ICD-10-CM | POA: Diagnosis not present

## 2022-09-01 DIAGNOSIS — I951 Orthostatic hypotension: Secondary | ICD-10-CM | POA: Diagnosis not present

## 2022-09-01 DIAGNOSIS — G2581 Restless legs syndrome: Secondary | ICD-10-CM | POA: Diagnosis not present

## 2022-09-01 DIAGNOSIS — I872 Venous insufficiency (chronic) (peripheral): Secondary | ICD-10-CM | POA: Diagnosis not present

## 2022-09-01 DIAGNOSIS — E1122 Type 2 diabetes mellitus with diabetic chronic kidney disease: Secondary | ICD-10-CM | POA: Diagnosis not present

## 2022-09-01 DIAGNOSIS — I130 Hypertensive heart and chronic kidney disease with heart failure and stage 1 through stage 4 chronic kidney disease, or unspecified chronic kidney disease: Secondary | ICD-10-CM | POA: Diagnosis not present

## 2022-09-01 DIAGNOSIS — E785 Hyperlipidemia, unspecified: Secondary | ICD-10-CM | POA: Diagnosis not present

## 2022-09-01 DIAGNOSIS — M109 Gout, unspecified: Secondary | ICD-10-CM | POA: Diagnosis not present

## 2022-09-01 DIAGNOSIS — R1310 Dysphagia, unspecified: Secondary | ICD-10-CM | POA: Diagnosis not present

## 2022-09-01 DIAGNOSIS — J449 Chronic obstructive pulmonary disease, unspecified: Secondary | ICD-10-CM | POA: Diagnosis not present

## 2022-09-01 DIAGNOSIS — D631 Anemia in chronic kidney disease: Secondary | ICD-10-CM | POA: Diagnosis not present

## 2022-09-01 DIAGNOSIS — G4700 Insomnia, unspecified: Secondary | ICD-10-CM | POA: Diagnosis not present

## 2022-09-01 DIAGNOSIS — F419 Anxiety disorder, unspecified: Secondary | ICD-10-CM | POA: Diagnosis not present

## 2022-09-01 DIAGNOSIS — E669 Obesity, unspecified: Secondary | ICD-10-CM | POA: Diagnosis not present

## 2022-09-01 DIAGNOSIS — K5790 Diverticulosis of intestine, part unspecified, without perforation or abscess without bleeding: Secondary | ICD-10-CM | POA: Diagnosis not present

## 2022-09-01 DIAGNOSIS — E1151 Type 2 diabetes mellitus with diabetic peripheral angiopathy without gangrene: Secondary | ICD-10-CM | POA: Diagnosis not present

## 2022-09-01 DIAGNOSIS — K219 Gastro-esophageal reflux disease without esophagitis: Secondary | ICD-10-CM | POA: Diagnosis not present

## 2022-09-01 DIAGNOSIS — G252 Other specified forms of tremor: Secondary | ICD-10-CM | POA: Diagnosis not present

## 2022-09-01 DIAGNOSIS — N183 Chronic kidney disease, stage 3 unspecified: Secondary | ICD-10-CM | POA: Diagnosis not present

## 2022-09-01 DIAGNOSIS — I5032 Chronic diastolic (congestive) heart failure: Secondary | ICD-10-CM | POA: Diagnosis not present

## 2022-09-01 NOTE — Telephone Encounter (Signed)
Allie, visiting nurse with [facility], called to advise the pt report a fall on 08/27/22 in her bathroom. No injury noted and no loss of consciousness.

## 2022-09-05 NOTE — Telephone Encounter (Signed)
Left message for pt to return call.

## 2022-09-05 NOTE — Telephone Encounter (Signed)
Spoke with pt. She advised PT has not come yet but she has had 3 visits with OT. PT reached out to her but they have not been out to help her. She still has been using her walker. She said she fell when she tried standing up and lost her balance.

## 2022-09-06 ENCOUNTER — Telehealth: Payer: Self-pay | Admitting: Medical

## 2022-09-06 ENCOUNTER — Telehealth: Payer: Self-pay | Admitting: Internal Medicine

## 2022-09-06 DIAGNOSIS — N183 Chronic kidney disease, stage 3 unspecified: Secondary | ICD-10-CM | POA: Diagnosis not present

## 2022-09-06 DIAGNOSIS — E1151 Type 2 diabetes mellitus with diabetic peripheral angiopathy without gangrene: Secondary | ICD-10-CM | POA: Diagnosis not present

## 2022-09-06 DIAGNOSIS — G252 Other specified forms of tremor: Secondary | ICD-10-CM | POA: Diagnosis not present

## 2022-09-06 DIAGNOSIS — I951 Orthostatic hypotension: Secondary | ICD-10-CM | POA: Diagnosis not present

## 2022-09-06 DIAGNOSIS — I872 Venous insufficiency (chronic) (peripheral): Secondary | ICD-10-CM | POA: Diagnosis not present

## 2022-09-06 DIAGNOSIS — I5032 Chronic diastolic (congestive) heart failure: Secondary | ICD-10-CM | POA: Diagnosis not present

## 2022-09-06 DIAGNOSIS — K5790 Diverticulosis of intestine, part unspecified, without perforation or abscess without bleeding: Secondary | ICD-10-CM | POA: Diagnosis not present

## 2022-09-06 DIAGNOSIS — E785 Hyperlipidemia, unspecified: Secondary | ICD-10-CM | POA: Diagnosis not present

## 2022-09-06 DIAGNOSIS — R1310 Dysphagia, unspecified: Secondary | ICD-10-CM | POA: Diagnosis not present

## 2022-09-06 DIAGNOSIS — G2581 Restless legs syndrome: Secondary | ICD-10-CM | POA: Diagnosis not present

## 2022-09-06 DIAGNOSIS — K219 Gastro-esophageal reflux disease without esophagitis: Secondary | ICD-10-CM | POA: Diagnosis not present

## 2022-09-06 DIAGNOSIS — M170 Bilateral primary osteoarthritis of knee: Secondary | ICD-10-CM | POA: Diagnosis not present

## 2022-09-06 DIAGNOSIS — E669 Obesity, unspecified: Secondary | ICD-10-CM | POA: Diagnosis not present

## 2022-09-06 DIAGNOSIS — E039 Hypothyroidism, unspecified: Secondary | ICD-10-CM | POA: Diagnosis not present

## 2022-09-06 DIAGNOSIS — I130 Hypertensive heart and chronic kidney disease with heart failure and stage 1 through stage 4 chronic kidney disease, or unspecified chronic kidney disease: Secondary | ICD-10-CM | POA: Diagnosis not present

## 2022-09-06 DIAGNOSIS — E1122 Type 2 diabetes mellitus with diabetic chronic kidney disease: Secondary | ICD-10-CM | POA: Diagnosis not present

## 2022-09-06 DIAGNOSIS — D631 Anemia in chronic kidney disease: Secondary | ICD-10-CM | POA: Diagnosis not present

## 2022-09-06 DIAGNOSIS — F32A Depression, unspecified: Secondary | ICD-10-CM | POA: Diagnosis not present

## 2022-09-06 DIAGNOSIS — G4700 Insomnia, unspecified: Secondary | ICD-10-CM | POA: Diagnosis not present

## 2022-09-06 DIAGNOSIS — E1140 Type 2 diabetes mellitus with diabetic neuropathy, unspecified: Secondary | ICD-10-CM | POA: Diagnosis not present

## 2022-09-06 DIAGNOSIS — M4626 Osteomyelitis of vertebra, lumbar region: Secondary | ICD-10-CM | POA: Diagnosis not present

## 2022-09-06 DIAGNOSIS — M48061 Spinal stenosis, lumbar region without neurogenic claudication: Secondary | ICD-10-CM | POA: Diagnosis not present

## 2022-09-06 DIAGNOSIS — M109 Gout, unspecified: Secondary | ICD-10-CM | POA: Diagnosis not present

## 2022-09-06 DIAGNOSIS — J449 Chronic obstructive pulmonary disease, unspecified: Secondary | ICD-10-CM | POA: Diagnosis not present

## 2022-09-06 DIAGNOSIS — F419 Anxiety disorder, unspecified: Secondary | ICD-10-CM | POA: Diagnosis not present

## 2022-09-06 NOTE — Telephone Encounter (Signed)
Please advise 

## 2022-09-06 NOTE — Telephone Encounter (Signed)
Brandi Stephens called and states Brandi Stephens has to contact [employer] and let them know she no longer requires her oxygen tank so that she is able to take it back to them. She says her daughter will be going to see her tomorrow and will be getting the oxygen tank to take back to [facility].

## 2022-09-06 NOTE — Telephone Encounter (Signed)
Allison with [facility] called and states that patient said overnight her BP was 168/143 and HR was 148. When she got there today her bp was 130/82 and HR was 80-82. No symptoms and everything was stable. Revonda Standard did do a lot of education with patient and to let her know that she could of called EMS or [facility] emergency number to get help but all exams were intact. Pt is aware now if this happens again. Revonda Standard just wanted to inform you what happen.

## 2022-09-07 NOTE — Telephone Encounter (Signed)
I have faxed over order to d/c oxygen

## 2022-09-13 DIAGNOSIS — J449 Chronic obstructive pulmonary disease, unspecified: Secondary | ICD-10-CM | POA: Diagnosis not present

## 2022-09-13 DIAGNOSIS — M48061 Spinal stenosis, lumbar region without neurogenic claudication: Secondary | ICD-10-CM | POA: Diagnosis not present

## 2022-09-13 DIAGNOSIS — K5790 Diverticulosis of intestine, part unspecified, without perforation or abscess without bleeding: Secondary | ICD-10-CM | POA: Diagnosis not present

## 2022-09-13 DIAGNOSIS — D631 Anemia in chronic kidney disease: Secondary | ICD-10-CM | POA: Diagnosis not present

## 2022-09-13 DIAGNOSIS — E1151 Type 2 diabetes mellitus with diabetic peripheral angiopathy without gangrene: Secondary | ICD-10-CM | POA: Diagnosis not present

## 2022-09-13 DIAGNOSIS — E785 Hyperlipidemia, unspecified: Secondary | ICD-10-CM | POA: Diagnosis not present

## 2022-09-13 DIAGNOSIS — I951 Orthostatic hypotension: Secondary | ICD-10-CM | POA: Diagnosis not present

## 2022-09-13 DIAGNOSIS — E669 Obesity, unspecified: Secondary | ICD-10-CM | POA: Diagnosis not present

## 2022-09-13 DIAGNOSIS — M109 Gout, unspecified: Secondary | ICD-10-CM | POA: Diagnosis not present

## 2022-09-13 DIAGNOSIS — E1140 Type 2 diabetes mellitus with diabetic neuropathy, unspecified: Secondary | ICD-10-CM | POA: Diagnosis not present

## 2022-09-13 DIAGNOSIS — K219 Gastro-esophageal reflux disease without esophagitis: Secondary | ICD-10-CM | POA: Diagnosis not present

## 2022-09-13 DIAGNOSIS — G2581 Restless legs syndrome: Secondary | ICD-10-CM | POA: Diagnosis not present

## 2022-09-13 DIAGNOSIS — R1310 Dysphagia, unspecified: Secondary | ICD-10-CM | POA: Diagnosis not present

## 2022-09-13 DIAGNOSIS — E039 Hypothyroidism, unspecified: Secondary | ICD-10-CM | POA: Diagnosis not present

## 2022-09-13 DIAGNOSIS — M4626 Osteomyelitis of vertebra, lumbar region: Secondary | ICD-10-CM | POA: Diagnosis not present

## 2022-09-13 DIAGNOSIS — G252 Other specified forms of tremor: Secondary | ICD-10-CM | POA: Diagnosis not present

## 2022-09-13 DIAGNOSIS — F32A Depression, unspecified: Secondary | ICD-10-CM | POA: Diagnosis not present

## 2022-09-13 DIAGNOSIS — I130 Hypertensive heart and chronic kidney disease with heart failure and stage 1 through stage 4 chronic kidney disease, or unspecified chronic kidney disease: Secondary | ICD-10-CM | POA: Diagnosis not present

## 2022-09-13 DIAGNOSIS — I5032 Chronic diastolic (congestive) heart failure: Secondary | ICD-10-CM | POA: Diagnosis not present

## 2022-09-13 DIAGNOSIS — M170 Bilateral primary osteoarthritis of knee: Secondary | ICD-10-CM | POA: Diagnosis not present

## 2022-09-13 DIAGNOSIS — G4700 Insomnia, unspecified: Secondary | ICD-10-CM | POA: Diagnosis not present

## 2022-09-13 DIAGNOSIS — E1122 Type 2 diabetes mellitus with diabetic chronic kidney disease: Secondary | ICD-10-CM | POA: Diagnosis not present

## 2022-09-13 DIAGNOSIS — F419 Anxiety disorder, unspecified: Secondary | ICD-10-CM | POA: Diagnosis not present

## 2022-09-13 DIAGNOSIS — N183 Chronic kidney disease, stage 3 unspecified: Secondary | ICD-10-CM | POA: Diagnosis not present

## 2022-09-13 DIAGNOSIS — I872 Venous insufficiency (chronic) (peripheral): Secondary | ICD-10-CM | POA: Diagnosis not present

## 2022-09-19 ENCOUNTER — Telehealth: Payer: Self-pay | Admitting: Medical

## 2022-09-19 DIAGNOSIS — I951 Orthostatic hypotension: Secondary | ICD-10-CM | POA: Diagnosis not present

## 2022-09-19 DIAGNOSIS — F419 Anxiety disorder, unspecified: Secondary | ICD-10-CM | POA: Diagnosis not present

## 2022-09-19 DIAGNOSIS — G252 Other specified forms of tremor: Secondary | ICD-10-CM | POA: Diagnosis not present

## 2022-09-19 DIAGNOSIS — M4626 Osteomyelitis of vertebra, lumbar region: Secondary | ICD-10-CM | POA: Diagnosis not present

## 2022-09-19 DIAGNOSIS — K219 Gastro-esophageal reflux disease without esophagitis: Secondary | ICD-10-CM | POA: Diagnosis not present

## 2022-09-19 DIAGNOSIS — E1151 Type 2 diabetes mellitus with diabetic peripheral angiopathy without gangrene: Secondary | ICD-10-CM | POA: Diagnosis not present

## 2022-09-19 DIAGNOSIS — N183 Chronic kidney disease, stage 3 unspecified: Secondary | ICD-10-CM | POA: Diagnosis not present

## 2022-09-19 DIAGNOSIS — E1140 Type 2 diabetes mellitus with diabetic neuropathy, unspecified: Secondary | ICD-10-CM | POA: Diagnosis not present

## 2022-09-19 DIAGNOSIS — M170 Bilateral primary osteoarthritis of knee: Secondary | ICD-10-CM | POA: Diagnosis not present

## 2022-09-19 DIAGNOSIS — M48061 Spinal stenosis, lumbar region without neurogenic claudication: Secondary | ICD-10-CM | POA: Diagnosis not present

## 2022-09-19 DIAGNOSIS — I5032 Chronic diastolic (congestive) heart failure: Secondary | ICD-10-CM | POA: Diagnosis not present

## 2022-09-19 DIAGNOSIS — E669 Obesity, unspecified: Secondary | ICD-10-CM | POA: Diagnosis not present

## 2022-09-19 DIAGNOSIS — E1122 Type 2 diabetes mellitus with diabetic chronic kidney disease: Secondary | ICD-10-CM | POA: Diagnosis not present

## 2022-09-19 DIAGNOSIS — E785 Hyperlipidemia, unspecified: Secondary | ICD-10-CM | POA: Diagnosis not present

## 2022-09-19 DIAGNOSIS — M109 Gout, unspecified: Secondary | ICD-10-CM | POA: Diagnosis not present

## 2022-09-19 DIAGNOSIS — J449 Chronic obstructive pulmonary disease, unspecified: Secondary | ICD-10-CM | POA: Diagnosis not present

## 2022-09-19 DIAGNOSIS — K5790 Diverticulosis of intestine, part unspecified, without perforation or abscess without bleeding: Secondary | ICD-10-CM | POA: Diagnosis not present

## 2022-09-19 DIAGNOSIS — E039 Hypothyroidism, unspecified: Secondary | ICD-10-CM | POA: Diagnosis not present

## 2022-09-19 DIAGNOSIS — I130 Hypertensive heart and chronic kidney disease with heart failure and stage 1 through stage 4 chronic kidney disease, or unspecified chronic kidney disease: Secondary | ICD-10-CM | POA: Diagnosis not present

## 2022-09-19 DIAGNOSIS — I872 Venous insufficiency (chronic) (peripheral): Secondary | ICD-10-CM | POA: Diagnosis not present

## 2022-09-19 DIAGNOSIS — G2581 Restless legs syndrome: Secondary | ICD-10-CM | POA: Diagnosis not present

## 2022-09-19 DIAGNOSIS — D631 Anemia in chronic kidney disease: Secondary | ICD-10-CM | POA: Diagnosis not present

## 2022-09-19 DIAGNOSIS — G4700 Insomnia, unspecified: Secondary | ICD-10-CM | POA: Diagnosis not present

## 2022-09-19 DIAGNOSIS — R1310 Dysphagia, unspecified: Secondary | ICD-10-CM | POA: Diagnosis not present

## 2022-09-19 DIAGNOSIS — F32A Depression, unspecified: Secondary | ICD-10-CM | POA: Diagnosis not present

## 2022-09-19 NOTE — Telephone Encounter (Signed)
Kim from [facility] called and states Brandi Stephens is no longer taking amitriptyline , midodrine , or Semaglutide. She has started taking ashwugunda.  Provided call back number [phone]

## 2022-09-22 NOTE — Telephone Encounter (Signed)
Pt was notified.  

## 2022-09-26 DIAGNOSIS — E039 Hypothyroidism, unspecified: Secondary | ICD-10-CM | POA: Diagnosis not present

## 2022-09-26 DIAGNOSIS — M48061 Spinal stenosis, lumbar region without neurogenic claudication: Secondary | ICD-10-CM | POA: Diagnosis not present

## 2022-09-26 DIAGNOSIS — G4700 Insomnia, unspecified: Secondary | ICD-10-CM | POA: Diagnosis not present

## 2022-09-26 DIAGNOSIS — F419 Anxiety disorder, unspecified: Secondary | ICD-10-CM | POA: Diagnosis not present

## 2022-09-26 DIAGNOSIS — R1310 Dysphagia, unspecified: Secondary | ICD-10-CM | POA: Diagnosis not present

## 2022-09-26 DIAGNOSIS — G2581 Restless legs syndrome: Secondary | ICD-10-CM | POA: Diagnosis not present

## 2022-09-26 DIAGNOSIS — I951 Orthostatic hypotension: Secondary | ICD-10-CM | POA: Diagnosis not present

## 2022-09-26 DIAGNOSIS — K219 Gastro-esophageal reflux disease without esophagitis: Secondary | ICD-10-CM | POA: Diagnosis not present

## 2022-09-26 DIAGNOSIS — K5790 Diverticulosis of intestine, part unspecified, without perforation or abscess without bleeding: Secondary | ICD-10-CM | POA: Diagnosis not present

## 2022-09-26 DIAGNOSIS — I872 Venous insufficiency (chronic) (peripheral): Secondary | ICD-10-CM | POA: Diagnosis not present

## 2022-09-26 DIAGNOSIS — E1151 Type 2 diabetes mellitus with diabetic peripheral angiopathy without gangrene: Secondary | ICD-10-CM | POA: Diagnosis not present

## 2022-09-26 DIAGNOSIS — E785 Hyperlipidemia, unspecified: Secondary | ICD-10-CM | POA: Diagnosis not present

## 2022-09-26 DIAGNOSIS — J449 Chronic obstructive pulmonary disease, unspecified: Secondary | ICD-10-CM | POA: Diagnosis not present

## 2022-09-26 DIAGNOSIS — E1140 Type 2 diabetes mellitus with diabetic neuropathy, unspecified: Secondary | ICD-10-CM | POA: Diagnosis not present

## 2022-09-26 DIAGNOSIS — E1122 Type 2 diabetes mellitus with diabetic chronic kidney disease: Secondary | ICD-10-CM | POA: Diagnosis not present

## 2022-09-26 DIAGNOSIS — G252 Other specified forms of tremor: Secondary | ICD-10-CM | POA: Diagnosis not present

## 2022-09-26 DIAGNOSIS — I130 Hypertensive heart and chronic kidney disease with heart failure and stage 1 through stage 4 chronic kidney disease, or unspecified chronic kidney disease: Secondary | ICD-10-CM | POA: Diagnosis not present

## 2022-09-26 DIAGNOSIS — E669 Obesity, unspecified: Secondary | ICD-10-CM | POA: Diagnosis not present

## 2022-09-26 DIAGNOSIS — M170 Bilateral primary osteoarthritis of knee: Secondary | ICD-10-CM | POA: Diagnosis not present

## 2022-09-26 DIAGNOSIS — N183 Chronic kidney disease, stage 3 unspecified: Secondary | ICD-10-CM | POA: Diagnosis not present

## 2022-09-26 DIAGNOSIS — M109 Gout, unspecified: Secondary | ICD-10-CM | POA: Diagnosis not present

## 2022-09-26 DIAGNOSIS — I5032 Chronic diastolic (congestive) heart failure: Secondary | ICD-10-CM | POA: Diagnosis not present

## 2022-09-26 DIAGNOSIS — M4626 Osteomyelitis of vertebra, lumbar region: Secondary | ICD-10-CM | POA: Diagnosis not present

## 2022-09-26 DIAGNOSIS — F32A Depression, unspecified: Secondary | ICD-10-CM | POA: Diagnosis not present

## 2022-09-26 DIAGNOSIS — D631 Anemia in chronic kidney disease: Secondary | ICD-10-CM | POA: Diagnosis not present

## 2022-09-30 ENCOUNTER — Ambulatory Visit: Payer: Medicare HMO | Admitting: Physical Medicine and Rehabilitation

## 2022-10-03 ENCOUNTER — Other Ambulatory Visit: Payer: Self-pay | Admitting: Medical

## 2022-10-03 ENCOUNTER — Telehealth: Payer: Self-pay | Admitting: Medical

## 2022-10-03 ENCOUNTER — Other Ambulatory Visit: Payer: Self-pay | Admitting: *Deleted

## 2022-10-03 MED ORDER — COLCHICINE 0.6 MG PO TABS: ORAL_TABLET | ORAL | 2 refills | Status: DC

## 2022-10-03 MED ORDER — ATORVASTATIN CALCIUM 80 MG PO TABS: ORAL_TABLET | ORAL | 0 refills | Status: DC

## 2022-10-03 MED ORDER — ROPINIROLE HCL 1 MG PO TABS: ORAL_TABLET | ORAL | 0 refills | Status: DC

## 2022-10-03 MED ORDER — FREESTYLE LIBRE 3 SENSOR MISC: 3 refills | Status: DC

## 2022-10-03 MED ORDER — DULOXETINE HCL 60 MG PO CPEP: ORAL_CAPSULE | ORAL | 0 refills | Status: DC

## 2022-10-03 MED ORDER — VITAMIN D3 50 MCG (2000 UT) PO CAPS: 2000.0000 [IU] | ORAL_CAPSULE | Freq: Every day | ORAL | 3 refills | Status: DC

## 2022-10-03 MED ORDER — SEMAGLUTIDE (1 MG/DOSE) 4 MG/3ML ~~LOC~~ SOPN: 1.0000 mg | PEN_INJECTOR | SUBCUTANEOUS | 2 refills | Status: DC

## 2022-10-03 MED ORDER — TRELEGY ELLIPTA 100-62.5-25 MCG/ACT IN AEPB: 1.0000 | INHALATION_SPRAY | Freq: Every day | RESPIRATORY_TRACT | 3 refills | Status: DC

## 2022-10-03 NOTE — Telephone Encounter (Signed)
PT started Ozempic again and she asked to have you call her when you are back from vacation

## 2022-10-03 NOTE — Telephone Encounter (Signed)
Done

## 2022-10-03 NOTE — Telephone Encounter (Signed)
[facility] called ( pt has relocated to [facility]  but still being seen here)  They need [place] to send over all of her current medications that we are prescribing  Pt brought them a list but has rx's from other doctors also

## 2022-10-05 DIAGNOSIS — I5032 Chronic diastolic (congestive) heart failure: Secondary | ICD-10-CM | POA: Diagnosis not present

## 2022-10-05 DIAGNOSIS — D631 Anemia in chronic kidney disease: Secondary | ICD-10-CM | POA: Diagnosis not present

## 2022-10-05 DIAGNOSIS — I951 Orthostatic hypotension: Secondary | ICD-10-CM | POA: Diagnosis not present

## 2022-10-05 DIAGNOSIS — E669 Obesity, unspecified: Secondary | ICD-10-CM | POA: Diagnosis not present

## 2022-10-05 DIAGNOSIS — F32A Depression, unspecified: Secondary | ICD-10-CM | POA: Diagnosis not present

## 2022-10-05 DIAGNOSIS — J449 Chronic obstructive pulmonary disease, unspecified: Secondary | ICD-10-CM | POA: Diagnosis not present

## 2022-10-05 DIAGNOSIS — M109 Gout, unspecified: Secondary | ICD-10-CM | POA: Diagnosis not present

## 2022-10-05 DIAGNOSIS — E039 Hypothyroidism, unspecified: Secondary | ICD-10-CM | POA: Diagnosis not present

## 2022-10-05 DIAGNOSIS — G2581 Restless legs syndrome: Secondary | ICD-10-CM | POA: Diagnosis not present

## 2022-10-05 DIAGNOSIS — K5790 Diverticulosis of intestine, part unspecified, without perforation or abscess without bleeding: Secondary | ICD-10-CM | POA: Diagnosis not present

## 2022-10-05 DIAGNOSIS — K219 Gastro-esophageal reflux disease without esophagitis: Secondary | ICD-10-CM | POA: Diagnosis not present

## 2022-10-05 DIAGNOSIS — R1310 Dysphagia, unspecified: Secondary | ICD-10-CM | POA: Diagnosis not present

## 2022-10-05 DIAGNOSIS — E1122 Type 2 diabetes mellitus with diabetic chronic kidney disease: Secondary | ICD-10-CM | POA: Diagnosis not present

## 2022-10-05 DIAGNOSIS — M4626 Osteomyelitis of vertebra, lumbar region: Secondary | ICD-10-CM | POA: Diagnosis not present

## 2022-10-05 DIAGNOSIS — N183 Chronic kidney disease, stage 3 unspecified: Secondary | ICD-10-CM | POA: Diagnosis not present

## 2022-10-05 DIAGNOSIS — I130 Hypertensive heart and chronic kidney disease with heart failure and stage 1 through stage 4 chronic kidney disease, or unspecified chronic kidney disease: Secondary | ICD-10-CM | POA: Diagnosis not present

## 2022-10-05 DIAGNOSIS — E1140 Type 2 diabetes mellitus with diabetic neuropathy, unspecified: Secondary | ICD-10-CM | POA: Diagnosis not present

## 2022-10-05 DIAGNOSIS — G4700 Insomnia, unspecified: Secondary | ICD-10-CM | POA: Diagnosis not present

## 2022-10-05 DIAGNOSIS — E1151 Type 2 diabetes mellitus with diabetic peripheral angiopathy without gangrene: Secondary | ICD-10-CM | POA: Diagnosis not present

## 2022-10-05 DIAGNOSIS — F419 Anxiety disorder, unspecified: Secondary | ICD-10-CM | POA: Diagnosis not present

## 2022-10-05 DIAGNOSIS — G252 Other specified forms of tremor: Secondary | ICD-10-CM | POA: Diagnosis not present

## 2022-10-05 DIAGNOSIS — M48061 Spinal stenosis, lumbar region without neurogenic claudication: Secondary | ICD-10-CM | POA: Diagnosis not present

## 2022-10-05 DIAGNOSIS — M170 Bilateral primary osteoarthritis of knee: Secondary | ICD-10-CM | POA: Diagnosis not present

## 2022-10-05 DIAGNOSIS — I872 Venous insufficiency (chronic) (peripheral): Secondary | ICD-10-CM | POA: Diagnosis not present

## 2022-10-05 DIAGNOSIS — E785 Hyperlipidemia, unspecified: Secondary | ICD-10-CM | POA: Diagnosis not present

## 2022-10-06 ENCOUNTER — Telehealth: Payer: Self-pay | Admitting: Medical

## 2022-10-06 NOTE — Telephone Encounter (Signed)
Sherry from [facility] home health is calling to request verbal order for continue OT and home health care  Informed her that shane is not in the office this week She can be reached at [phone]

## 2022-10-07 ENCOUNTER — Ambulatory Visit: Payer: Medicare HMO | Admitting: Nurse Practitioner

## 2022-10-10 ENCOUNTER — Telehealth: Payer: Self-pay | Admitting: Gastroenterology

## 2022-10-10 IMAGING — US US THYROID
1 series · 13 of 25 positions shown · non-contrast
Comparison: 05/22/2020 chest CT

CLINICAL DATA: Left lower pole nodule by recent chest CT

EXAM:
THYROID ULTRASOUND
TECHNIQUE: Ultrasound examination of the thyroid gland and adjacent soft
tissues was performed.

[Series 1: us thyroid · 0.05mm/px · 13 of 59 slices shown]
[im 1/59]
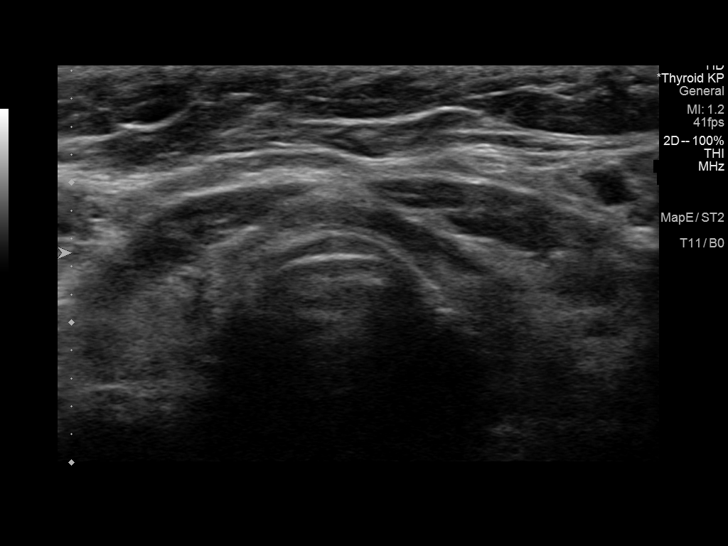
[im 5/59]
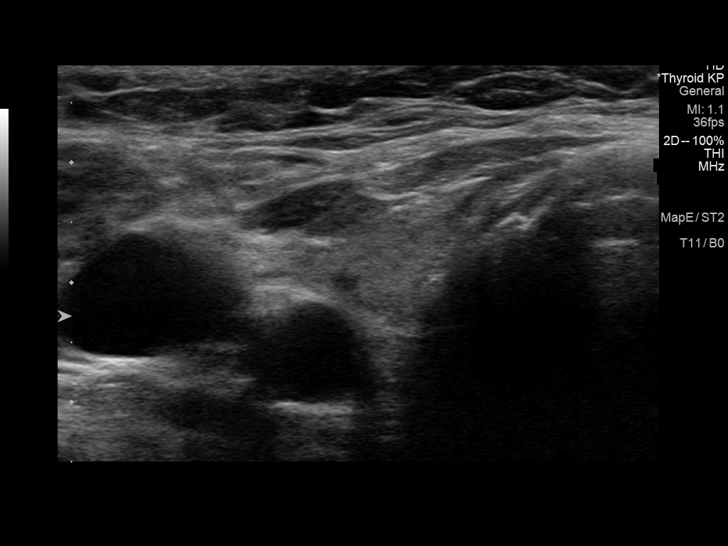
[im 10/59]
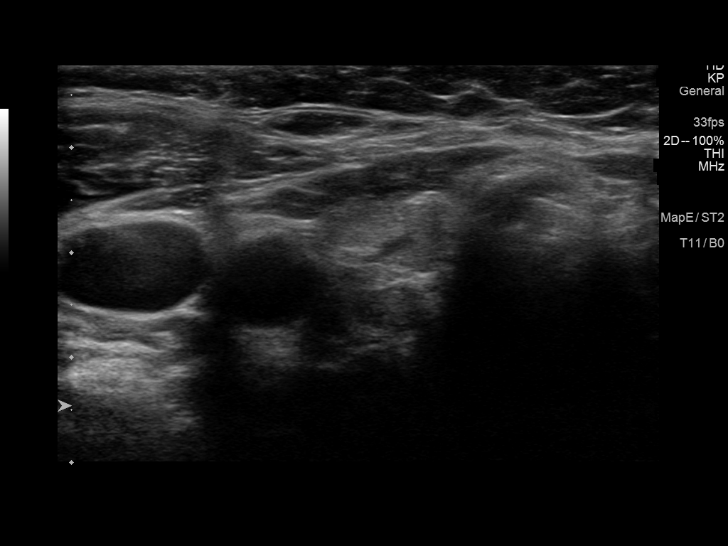
[im 15/59]
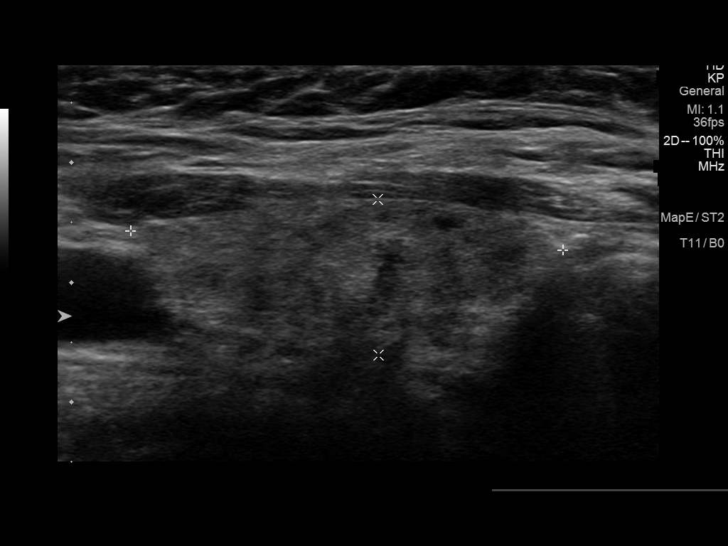
[im 20/59]
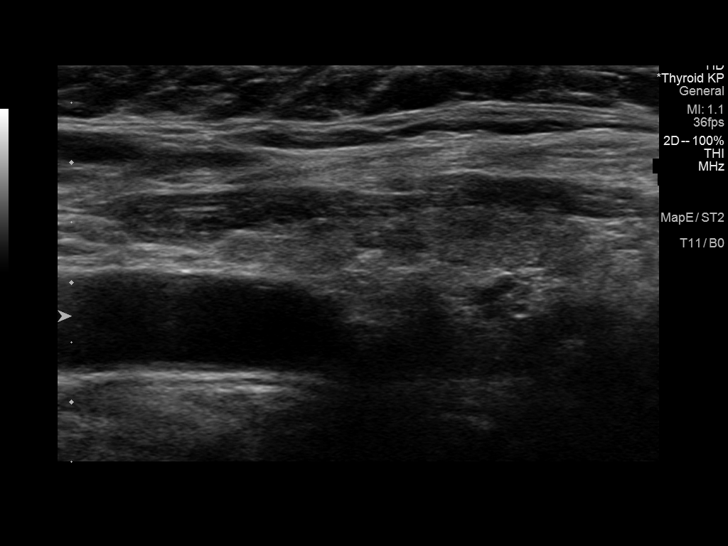
[im 25/59]
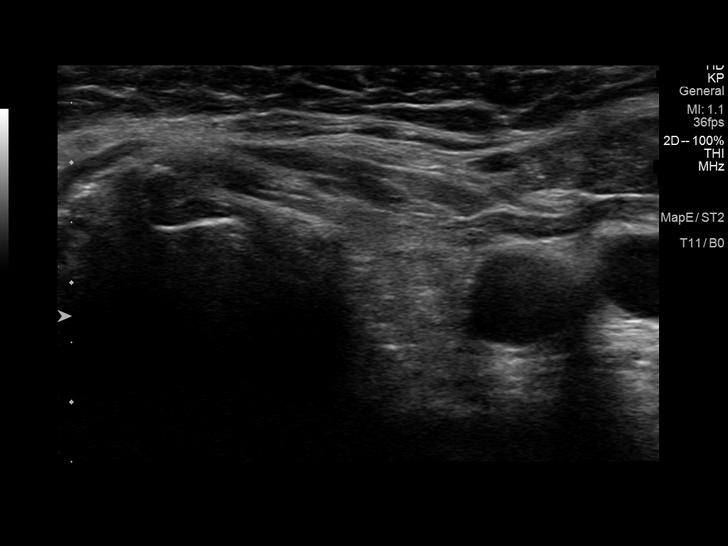
[im 30/59]
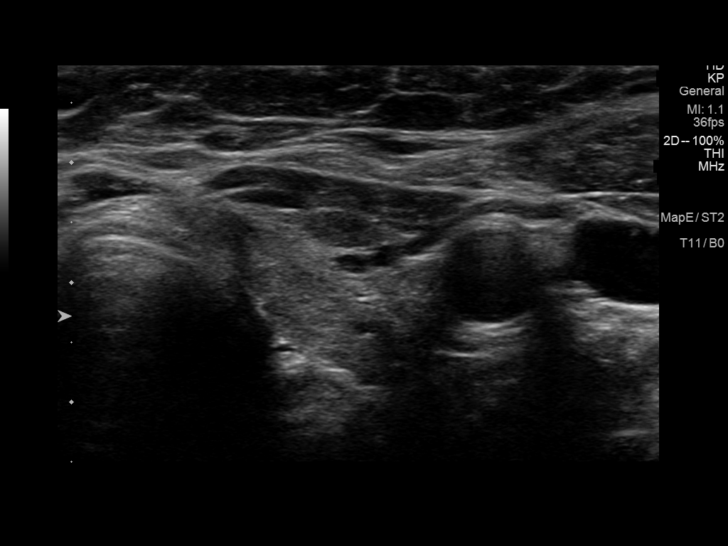
[im 34/59]
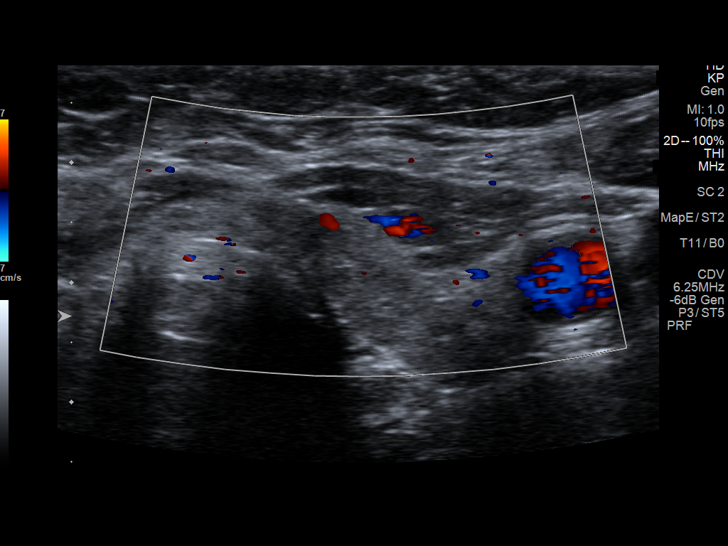
[im 39/59]
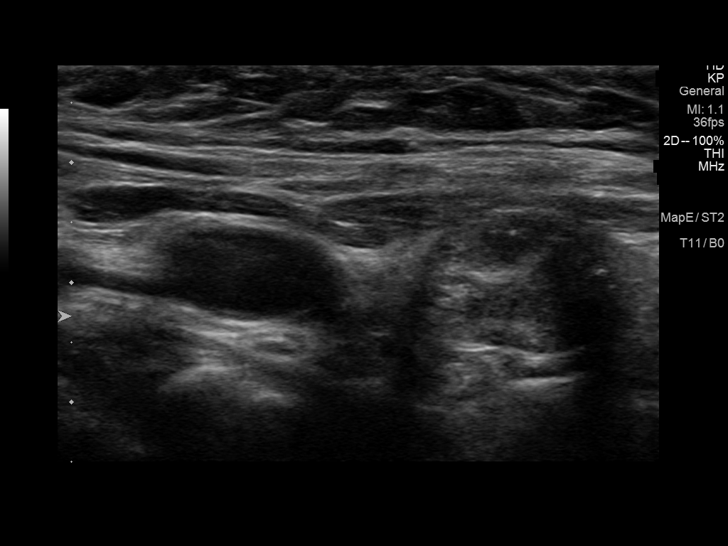
[im 44/59]
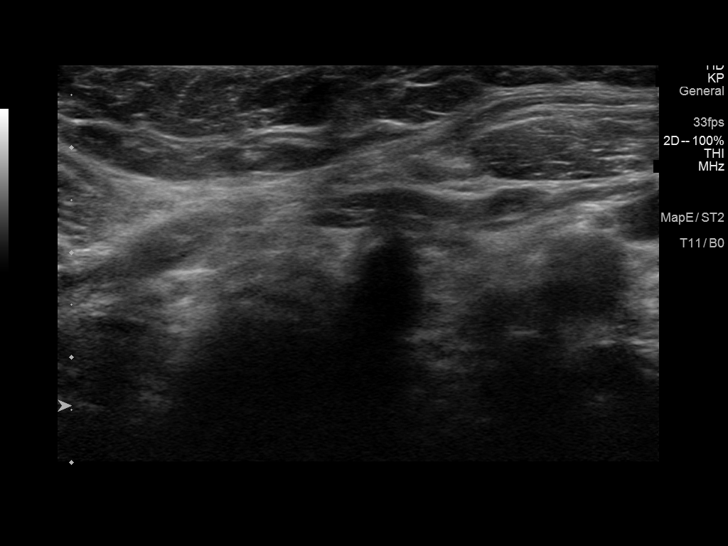
[im 49/59]
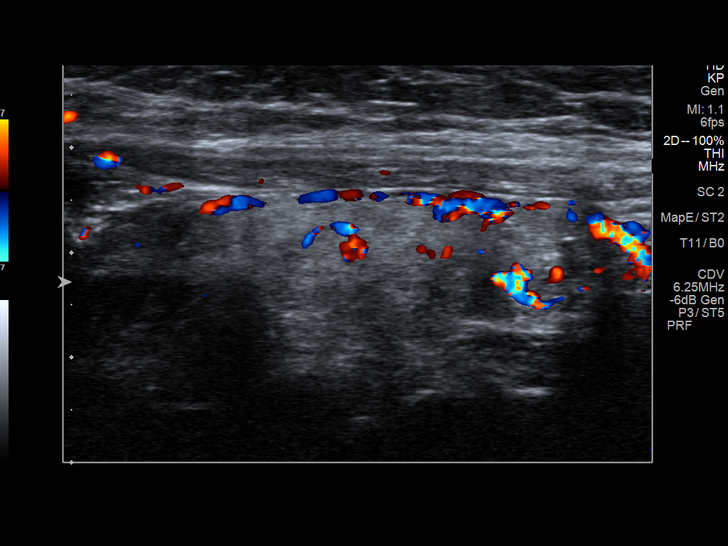
[im 54/59]
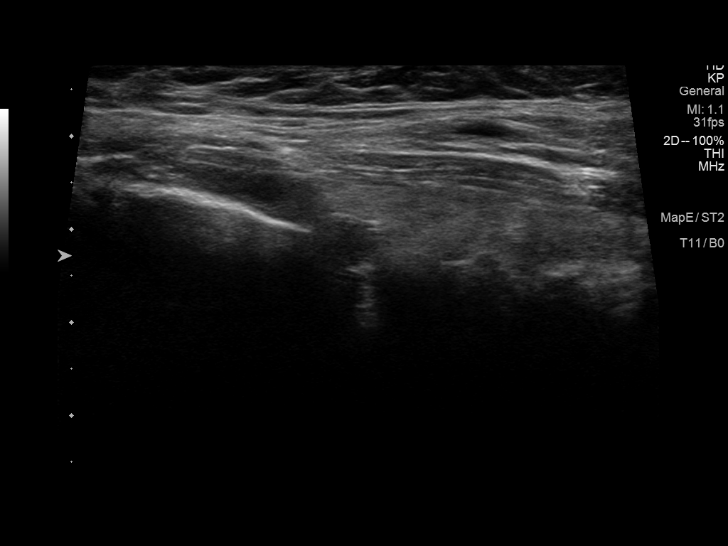
[im 59/59]
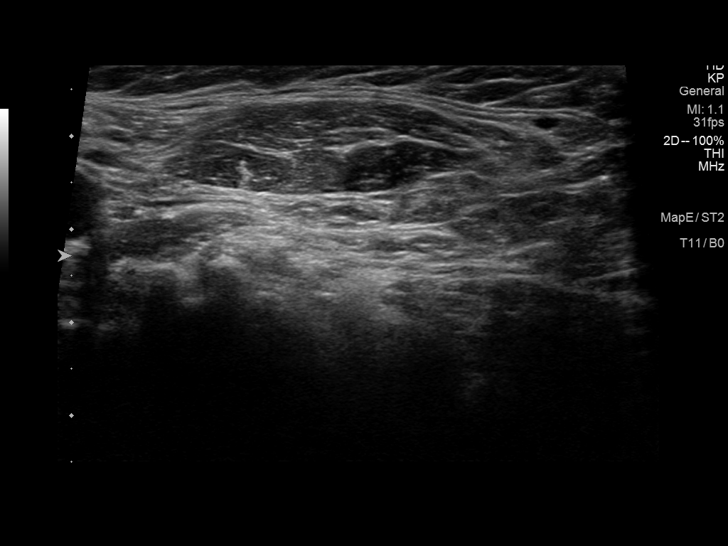

[13 of 25 positions shown; findings below may reference images not displayed]

FINDINGS: Parenchymal Echotexture: Moderately heterogenous

Isthmus: 3 mm

Right lobe: 3.6 x 1.3 x 1.5 cm

Left lobe: 4.8 x 1.6 x 1.7 cm

_________________________________________________________

Estimated total number of nodules >/= 1 cm: 1

Number of spongiform nodules >/=  2 cm not described below (TR1): 0

Number of mixed cystic and solid nodules >/= 1.5 cm not described
below (TR2): 0

_________________________________________________________

Nodule # 1:

Location: Left; Inferior

Maximum size: 1.7 cm; Other 2 dimensions: 1.5 x 1.2 cm

Composition: solid/almost completely solid (2)

Echogenicity: isoechoic (1)

Shape: not taller-than-wide (0)

Margins: ill-defined (0)

Echogenic foci: none (0)

ACR TI-RADS total points: 3.

ACR TI-RADS risk category: TR3 (3 points).

ACR TI-RADS recommendations:

*Given size (>/= 1.5 - 2.4 cm) and appearance, a follow-up
ultrasound in 1 year should be considered based on TI-RADS criteria.

_________________________________________________________

There is an additional subcentimeter hypoechoic left mid thyroid
nodule noted, measuring only 6 mm. This would not meet criteria for
any biopsy or follow-up and is not fully described by TI rads
criteria.

Nonspecific marked thyroid heterogeneity without hypervascularity.
No regional adenopathy.
IMPRESSION: 1.7 cm left inferior TR 3 nodule meets criteria for follow-up in 1
year. This correlates with the chest CT finding.

Nonspecific thyroid heterogeneity compatible with chronic medical
thyroid disease.

The above is in keeping with the ACR TI-RADS recommendations - [HOSPITAL] 4295;[DATE].

## 2022-10-12 ENCOUNTER — Ambulatory Visit: Payer: Medicare HMO | Admitting: Gastroenterology

## 2022-10-12 DIAGNOSIS — I872 Venous insufficiency (chronic) (peripheral): Secondary | ICD-10-CM | POA: Diagnosis not present

## 2022-10-12 DIAGNOSIS — D631 Anemia in chronic kidney disease: Secondary | ICD-10-CM | POA: Diagnosis not present

## 2022-10-12 DIAGNOSIS — E1140 Type 2 diabetes mellitus with diabetic neuropathy, unspecified: Secondary | ICD-10-CM | POA: Diagnosis not present

## 2022-10-12 DIAGNOSIS — I5032 Chronic diastolic (congestive) heart failure: Secondary | ICD-10-CM | POA: Diagnosis not present

## 2022-10-12 DIAGNOSIS — E1151 Type 2 diabetes mellitus with diabetic peripheral angiopathy without gangrene: Secondary | ICD-10-CM | POA: Diagnosis not present

## 2022-10-12 DIAGNOSIS — I130 Hypertensive heart and chronic kidney disease with heart failure and stage 1 through stage 4 chronic kidney disease, or unspecified chronic kidney disease: Secondary | ICD-10-CM | POA: Diagnosis not present

## 2022-10-12 DIAGNOSIS — E1122 Type 2 diabetes mellitus with diabetic chronic kidney disease: Secondary | ICD-10-CM | POA: Diagnosis not present

## 2022-10-12 DIAGNOSIS — G252 Other specified forms of tremor: Secondary | ICD-10-CM | POA: Diagnosis not present

## 2022-10-12 DIAGNOSIS — M109 Gout, unspecified: Secondary | ICD-10-CM | POA: Diagnosis not present

## 2022-10-12 DIAGNOSIS — F419 Anxiety disorder, unspecified: Secondary | ICD-10-CM | POA: Diagnosis not present

## 2022-10-12 DIAGNOSIS — M170 Bilateral primary osteoarthritis of knee: Secondary | ICD-10-CM | POA: Diagnosis not present

## 2022-10-12 DIAGNOSIS — E785 Hyperlipidemia, unspecified: Secondary | ICD-10-CM | POA: Diagnosis not present

## 2022-10-12 DIAGNOSIS — G4700 Insomnia, unspecified: Secondary | ICD-10-CM | POA: Diagnosis not present

## 2022-10-12 DIAGNOSIS — K5790 Diverticulosis of intestine, part unspecified, without perforation or abscess without bleeding: Secondary | ICD-10-CM | POA: Diagnosis not present

## 2022-10-12 DIAGNOSIS — M48061 Spinal stenosis, lumbar region without neurogenic claudication: Secondary | ICD-10-CM | POA: Diagnosis not present

## 2022-10-12 DIAGNOSIS — J449 Chronic obstructive pulmonary disease, unspecified: Secondary | ICD-10-CM | POA: Diagnosis not present

## 2022-10-12 DIAGNOSIS — E669 Obesity, unspecified: Secondary | ICD-10-CM | POA: Diagnosis not present

## 2022-10-12 DIAGNOSIS — E039 Hypothyroidism, unspecified: Secondary | ICD-10-CM | POA: Diagnosis not present

## 2022-10-12 DIAGNOSIS — R1310 Dysphagia, unspecified: Secondary | ICD-10-CM | POA: Diagnosis not present

## 2022-10-12 DIAGNOSIS — N183 Chronic kidney disease, stage 3 unspecified: Secondary | ICD-10-CM | POA: Diagnosis not present

## 2022-10-12 DIAGNOSIS — M4626 Osteomyelitis of vertebra, lumbar region: Secondary | ICD-10-CM | POA: Diagnosis not present

## 2022-10-12 DIAGNOSIS — F32A Depression, unspecified: Secondary | ICD-10-CM | POA: Diagnosis not present

## 2022-10-12 DIAGNOSIS — K219 Gastro-esophageal reflux disease without esophagitis: Secondary | ICD-10-CM | POA: Diagnosis not present

## 2022-10-12 DIAGNOSIS — I951 Orthostatic hypotension: Secondary | ICD-10-CM | POA: Diagnosis not present

## 2022-10-12 DIAGNOSIS — G2581 Restless legs syndrome: Secondary | ICD-10-CM | POA: Diagnosis not present

## 2022-10-13 NOTE — Addendum Note (Signed)
Addended by: Herminio Commons A on: 10/13/2022 04:36 PM   Modules accepted: Orders

## 2022-10-13 NOTE — Telephone Encounter (Signed)
Called pt and she states that ozempic 1mg  made her so sick and she can no longer take this. Her BS from her Josephine Igo has been around 28. She doesn't feel she needs this anymore. Pt is going to see GI on 7/16 about her constant diarrhea issue she has been having.

## 2022-10-19 DIAGNOSIS — N183 Chronic kidney disease, stage 3 unspecified: Secondary | ICD-10-CM | POA: Diagnosis not present

## 2022-10-19 DIAGNOSIS — F419 Anxiety disorder, unspecified: Secondary | ICD-10-CM | POA: Diagnosis not present

## 2022-10-19 DIAGNOSIS — I130 Hypertensive heart and chronic kidney disease with heart failure and stage 1 through stage 4 chronic kidney disease, or unspecified chronic kidney disease: Secondary | ICD-10-CM | POA: Diagnosis not present

## 2022-10-19 DIAGNOSIS — D631 Anemia in chronic kidney disease: Secondary | ICD-10-CM | POA: Diagnosis not present

## 2022-10-19 DIAGNOSIS — M4626 Osteomyelitis of vertebra, lumbar region: Secondary | ICD-10-CM | POA: Diagnosis not present

## 2022-10-19 DIAGNOSIS — G252 Other specified forms of tremor: Secondary | ICD-10-CM | POA: Diagnosis not present

## 2022-10-19 DIAGNOSIS — E039 Hypothyroidism, unspecified: Secondary | ICD-10-CM | POA: Diagnosis not present

## 2022-10-19 DIAGNOSIS — I951 Orthostatic hypotension: Secondary | ICD-10-CM | POA: Diagnosis not present

## 2022-10-19 DIAGNOSIS — E785 Hyperlipidemia, unspecified: Secondary | ICD-10-CM | POA: Diagnosis not present

## 2022-10-19 DIAGNOSIS — E1122 Type 2 diabetes mellitus with diabetic chronic kidney disease: Secondary | ICD-10-CM | POA: Diagnosis not present

## 2022-10-19 DIAGNOSIS — R1310 Dysphagia, unspecified: Secondary | ICD-10-CM | POA: Diagnosis not present

## 2022-10-19 DIAGNOSIS — I5032 Chronic diastolic (congestive) heart failure: Secondary | ICD-10-CM | POA: Diagnosis not present

## 2022-10-19 DIAGNOSIS — J449 Chronic obstructive pulmonary disease, unspecified: Secondary | ICD-10-CM | POA: Diagnosis not present

## 2022-10-19 DIAGNOSIS — E1151 Type 2 diabetes mellitus with diabetic peripheral angiopathy without gangrene: Secondary | ICD-10-CM | POA: Diagnosis not present

## 2022-10-19 DIAGNOSIS — M48061 Spinal stenosis, lumbar region without neurogenic claudication: Secondary | ICD-10-CM | POA: Diagnosis not present

## 2022-10-19 DIAGNOSIS — F32A Depression, unspecified: Secondary | ICD-10-CM | POA: Diagnosis not present

## 2022-10-19 DIAGNOSIS — E1140 Type 2 diabetes mellitus with diabetic neuropathy, unspecified: Secondary | ICD-10-CM | POA: Diagnosis not present

## 2022-10-19 DIAGNOSIS — K219 Gastro-esophageal reflux disease without esophagitis: Secondary | ICD-10-CM | POA: Diagnosis not present

## 2022-10-19 DIAGNOSIS — I872 Venous insufficiency (chronic) (peripheral): Secondary | ICD-10-CM | POA: Diagnosis not present

## 2022-10-19 DIAGNOSIS — G2581 Restless legs syndrome: Secondary | ICD-10-CM | POA: Diagnosis not present

## 2022-10-19 DIAGNOSIS — M109 Gout, unspecified: Secondary | ICD-10-CM | POA: Diagnosis not present

## 2022-10-19 DIAGNOSIS — M170 Bilateral primary osteoarthritis of knee: Secondary | ICD-10-CM | POA: Diagnosis not present

## 2022-10-19 DIAGNOSIS — K5790 Diverticulosis of intestine, part unspecified, without perforation or abscess without bleeding: Secondary | ICD-10-CM | POA: Diagnosis not present

## 2022-10-19 DIAGNOSIS — G4700 Insomnia, unspecified: Secondary | ICD-10-CM | POA: Diagnosis not present

## 2022-10-19 DIAGNOSIS — E669 Obesity, unspecified: Secondary | ICD-10-CM | POA: Diagnosis not present

## 2022-10-24 DIAGNOSIS — F32A Depression, unspecified: Secondary | ICD-10-CM | POA: Diagnosis not present

## 2022-10-24 DIAGNOSIS — J449 Chronic obstructive pulmonary disease, unspecified: Secondary | ICD-10-CM | POA: Diagnosis not present

## 2022-10-24 DIAGNOSIS — M170 Bilateral primary osteoarthritis of knee: Secondary | ICD-10-CM | POA: Diagnosis not present

## 2022-10-24 DIAGNOSIS — F419 Anxiety disorder, unspecified: Secondary | ICD-10-CM | POA: Diagnosis not present

## 2022-10-24 DIAGNOSIS — R1310 Dysphagia, unspecified: Secondary | ICD-10-CM | POA: Diagnosis not present

## 2022-10-24 DIAGNOSIS — E1140 Type 2 diabetes mellitus with diabetic neuropathy, unspecified: Secondary | ICD-10-CM | POA: Diagnosis not present

## 2022-10-24 DIAGNOSIS — D631 Anemia in chronic kidney disease: Secondary | ICD-10-CM | POA: Diagnosis not present

## 2022-10-24 DIAGNOSIS — E785 Hyperlipidemia, unspecified: Secondary | ICD-10-CM | POA: Diagnosis not present

## 2022-10-24 DIAGNOSIS — G4700 Insomnia, unspecified: Secondary | ICD-10-CM | POA: Diagnosis not present

## 2022-10-24 DIAGNOSIS — I951 Orthostatic hypotension: Secondary | ICD-10-CM | POA: Diagnosis not present

## 2022-10-24 DIAGNOSIS — I130 Hypertensive heart and chronic kidney disease with heart failure and stage 1 through stage 4 chronic kidney disease, or unspecified chronic kidney disease: Secondary | ICD-10-CM | POA: Diagnosis not present

## 2022-10-24 DIAGNOSIS — G252 Other specified forms of tremor: Secondary | ICD-10-CM | POA: Diagnosis not present

## 2022-10-24 DIAGNOSIS — I872 Venous insufficiency (chronic) (peripheral): Secondary | ICD-10-CM | POA: Diagnosis not present

## 2022-10-24 DIAGNOSIS — E1122 Type 2 diabetes mellitus with diabetic chronic kidney disease: Secondary | ICD-10-CM | POA: Diagnosis not present

## 2022-10-24 DIAGNOSIS — K5790 Diverticulosis of intestine, part unspecified, without perforation or abscess without bleeding: Secondary | ICD-10-CM | POA: Diagnosis not present

## 2022-10-24 DIAGNOSIS — E1151 Type 2 diabetes mellitus with diabetic peripheral angiopathy without gangrene: Secondary | ICD-10-CM | POA: Diagnosis not present

## 2022-10-24 DIAGNOSIS — K219 Gastro-esophageal reflux disease without esophagitis: Secondary | ICD-10-CM | POA: Diagnosis not present

## 2022-10-24 DIAGNOSIS — I5032 Chronic diastolic (congestive) heart failure: Secondary | ICD-10-CM | POA: Diagnosis not present

## 2022-10-24 DIAGNOSIS — E039 Hypothyroidism, unspecified: Secondary | ICD-10-CM | POA: Diagnosis not present

## 2022-10-24 DIAGNOSIS — N183 Chronic kidney disease, stage 3 unspecified: Secondary | ICD-10-CM | POA: Diagnosis not present

## 2022-10-24 DIAGNOSIS — M4626 Osteomyelitis of vertebra, lumbar region: Secondary | ICD-10-CM | POA: Diagnosis not present

## 2022-10-24 DIAGNOSIS — M109 Gout, unspecified: Secondary | ICD-10-CM | POA: Diagnosis not present

## 2022-10-24 DIAGNOSIS — E669 Obesity, unspecified: Secondary | ICD-10-CM | POA: Diagnosis not present

## 2022-10-24 DIAGNOSIS — G2581 Restless legs syndrome: Secondary | ICD-10-CM | POA: Diagnosis not present

## 2022-10-24 DIAGNOSIS — M48061 Spinal stenosis, lumbar region without neurogenic claudication: Secondary | ICD-10-CM | POA: Diagnosis not present

## 2022-10-25 ENCOUNTER — Telehealth: Payer: Self-pay | Admitting: Internal Medicine

## 2022-10-25 DIAGNOSIS — M79671 Pain in right foot: Secondary | ICD-10-CM

## 2022-10-25 NOTE — Telephone Encounter (Signed)
Pt would like a referral to a foot specialist. She is dragging her feet a lot and thinks its more than just neuropathy causing this. She has bruised her left foot and her pinky toes are hurting her. She will only be in town Monday- July 15- 22. Are you ok with a referral

## 2022-10-26 DIAGNOSIS — M48061 Spinal stenosis, lumbar region without neurogenic claudication: Secondary | ICD-10-CM | POA: Diagnosis not present

## 2022-10-26 DIAGNOSIS — F419 Anxiety disorder, unspecified: Secondary | ICD-10-CM | POA: Diagnosis not present

## 2022-10-26 DIAGNOSIS — E1151 Type 2 diabetes mellitus with diabetic peripheral angiopathy without gangrene: Secondary | ICD-10-CM | POA: Diagnosis not present

## 2022-10-26 DIAGNOSIS — E669 Obesity, unspecified: Secondary | ICD-10-CM | POA: Diagnosis not present

## 2022-10-26 DIAGNOSIS — N183 Chronic kidney disease, stage 3 unspecified: Secondary | ICD-10-CM | POA: Diagnosis not present

## 2022-10-26 DIAGNOSIS — G4700 Insomnia, unspecified: Secondary | ICD-10-CM | POA: Diagnosis not present

## 2022-10-26 DIAGNOSIS — M109 Gout, unspecified: Secondary | ICD-10-CM | POA: Diagnosis not present

## 2022-10-26 DIAGNOSIS — I5032 Chronic diastolic (congestive) heart failure: Secondary | ICD-10-CM | POA: Diagnosis not present

## 2022-10-26 DIAGNOSIS — R1310 Dysphagia, unspecified: Secondary | ICD-10-CM | POA: Diagnosis not present

## 2022-10-26 DIAGNOSIS — M4626 Osteomyelitis of vertebra, lumbar region: Secondary | ICD-10-CM | POA: Diagnosis not present

## 2022-10-26 DIAGNOSIS — I130 Hypertensive heart and chronic kidney disease with heart failure and stage 1 through stage 4 chronic kidney disease, or unspecified chronic kidney disease: Secondary | ICD-10-CM | POA: Diagnosis not present

## 2022-10-26 DIAGNOSIS — I951 Orthostatic hypotension: Secondary | ICD-10-CM | POA: Diagnosis not present

## 2022-10-26 DIAGNOSIS — G252 Other specified forms of tremor: Secondary | ICD-10-CM | POA: Diagnosis not present

## 2022-10-26 DIAGNOSIS — J449 Chronic obstructive pulmonary disease, unspecified: Secondary | ICD-10-CM | POA: Diagnosis not present

## 2022-10-26 DIAGNOSIS — M170 Bilateral primary osteoarthritis of knee: Secondary | ICD-10-CM | POA: Diagnosis not present

## 2022-10-26 DIAGNOSIS — E785 Hyperlipidemia, unspecified: Secondary | ICD-10-CM | POA: Diagnosis not present

## 2022-10-26 DIAGNOSIS — K219 Gastro-esophageal reflux disease without esophagitis: Secondary | ICD-10-CM | POA: Diagnosis not present

## 2022-10-26 DIAGNOSIS — G2581 Restless legs syndrome: Secondary | ICD-10-CM | POA: Diagnosis not present

## 2022-10-26 DIAGNOSIS — I872 Venous insufficiency (chronic) (peripheral): Secondary | ICD-10-CM | POA: Diagnosis not present

## 2022-10-26 DIAGNOSIS — E1122 Type 2 diabetes mellitus with diabetic chronic kidney disease: Secondary | ICD-10-CM | POA: Diagnosis not present

## 2022-10-26 DIAGNOSIS — D631 Anemia in chronic kidney disease: Secondary | ICD-10-CM | POA: Diagnosis not present

## 2022-10-26 DIAGNOSIS — E039 Hypothyroidism, unspecified: Secondary | ICD-10-CM | POA: Diagnosis not present

## 2022-10-26 DIAGNOSIS — F32A Depression, unspecified: Secondary | ICD-10-CM | POA: Diagnosis not present

## 2022-10-26 DIAGNOSIS — K5790 Diverticulosis of intestine, part unspecified, without perforation or abscess without bleeding: Secondary | ICD-10-CM | POA: Diagnosis not present

## 2022-10-26 DIAGNOSIS — E1140 Type 2 diabetes mellitus with diabetic neuropathy, unspecified: Secondary | ICD-10-CM | POA: Diagnosis not present

## 2022-10-27 NOTE — Telephone Encounter (Signed)
I have put in referral 

## 2022-10-31 ENCOUNTER — Other Ambulatory Visit: Payer: Self-pay | Admitting: Medical

## 2022-10-31 NOTE — Telephone Encounter (Signed)
Pt is on not 89

## 2022-11-01 ENCOUNTER — Encounter: Payer: Self-pay | Admitting: Gastroenterology

## 2022-11-01 ENCOUNTER — Ambulatory Visit: Payer: Medicare HMO | Admitting: Gastroenterology

## 2022-11-01 ENCOUNTER — Telehealth: Payer: Self-pay | Admitting: Adult Health

## 2022-11-01 VITALS — BP 95/65 | HR 113 | Ht 65.0 in | Wt 158.2 lb

## 2022-11-01 DIAGNOSIS — Z86010 Personal history of colonic polyps: Secondary | ICD-10-CM

## 2022-11-01 DIAGNOSIS — R197 Diarrhea, unspecified: Secondary | ICD-10-CM | POA: Diagnosis not present

## 2022-11-01 DIAGNOSIS — R6881 Early satiety: Secondary | ICD-10-CM | POA: Diagnosis not present

## 2022-11-01 DIAGNOSIS — R630 Anorexia: Secondary | ICD-10-CM

## 2022-11-01 DIAGNOSIS — R634 Abnormal weight loss: Secondary | ICD-10-CM | POA: Diagnosis not present

## 2022-11-01 MED ORDER — SUTAB 1479-225-188 MG PO TABS: 24.0000 | ORAL_TABLET | Freq: Once | ORAL | 0 refills | Status: AC

## 2022-11-01 MED ORDER — METOCLOPRAMIDE HCL 5 MG PO TABS: 5.0000 mg | ORAL_TABLET | Freq: Three times a day (TID) | ORAL | 5 refills | Status: DC

## 2022-11-01 NOTE — Patient Instructions (Addendum)
You have been scheduled for a colonoscopy. Please follow written instructions given to you at your visit today.   Please pick up your prep supplies at the pharmacy within the next 1-3 days.  If you use inhalers (even only as needed), please bring them with you on the day of your procedure.  DO NOT TAKE 7 DAYS PRIOR TO TEST- Trulicity (dulaglutide) Ozempic, Wegovy (semaglutide) Mounjaro (tirzepatide) Bydureon Bcise (exanatide extended release)  DO NOT TAKE 1 DAY PRIOR TO YOUR TEST Rybelsus (semaglutide) Adlyxin (lixisenatide) Victoza (liraglutide) Byetta (exanatide) ___________________________________________________________________________   Please go to the lab in the basement of our building to have lab work done as you leave today. Hit "B" for basement when you get on the elevator.  When the doors open the lab is on your left.  We will call you with the results. Thank you.   We have sent the following medications to your pharmacy for you to pick up at your convenience: Reglan 5 mg: Take three times a day before meals Continue Protonix  Eat regular meals.  Add Protein and highly caloric shakes.  Thank you for entrusting me with your care and for choosing [facility], Dr. Ileene Patrick    If your blood pressure at your visit was 140/90 or greater, please contact your primary care physician to follow up on this. ______________________________________________________  If you are age 74 or older, your body mass index should be between 23-30. Your Body mass index is 26.33 kg/m. If this is out of the aforementioned range listed, please consider follow up with your Primary Care Provider.  If you are age 68 or younger, your body mass index should be between 19-25. Your Body mass index is 26.33 kg/m. If this is out of the aformentioned range listed, please consider follow up with your Primary Care Provider.  ________________________________________________________  The  [facility] providers would like to encourage you to use [facility] to communicate with providers for non-urgent requests or questions.  Due to long hold times on the telephone, sending your provider a message by [facility] may be a faster and more efficient way to get a response.  Please allow 48 business hours for a response.  Please remember that this is for non-urgent requests.  _______________________________________________________  Due to recent changes in healthcare laws, you may see the results of your imaging and laboratory studies on MyChart before your provider has had a chance to review them.  We understand that in some cases there may be results that are confusing or concerning to you. Not all laboratory results come back in the same time frame and the provider may be waiting for multiple results in order to interpret others.  Please give [place] 48 hours in order for your provider to thoroughly review all the results before contacting the office for clarification of your results.

## 2022-11-01 NOTE — Telephone Encounter (Signed)
 Rescheduled appointment per provider PAL. Patient is aware of the changes made to her upcoming appointment. 

## 2022-11-01 NOTE — Progress Notes (Signed)
HPI :  77 year old female here for follow-up visit for weight loss, poor appetite, diarrhea.  I last saw her in April for a virtual visit.  Recall she had a right-sided colectomy due to large advanced polyp in the past in 2014.  I did a colonoscopy for her in 2019 and multiple adenomas were removed.  I recommended a 3-year follow-up exam.  Of note she had hypertrophied anal papillae that tested positive for AIN I had referred her to colorectal surgery at that time.  She states she thinks she saw them but does not recall her last follow-up with them.   She unfortunately was admitted to the hospital from March 4 to March 13 with sepsis of unidentified etiology although tested positive for norovirus, AKI.  Recovered from that hospitalization but had been having some intermittent nausea and vomiting.  In fact states she had intermittent nausea and vomiting along with early satiety since this past November, symptoms had persisted over time.   Remitted to the hospital from April 1 to April 6. She was found to have a UTI and treated with antibiotics but had severe nausea and vomiting and early satiety.  At that time recall she underwent an EGD with Dr. Russella Dar which showed some retained food in her stomach and some gastritis.  Biopsies of her stomach negative for H. pylori.  Polyps were hyperplastic benign.  She had a benign duodenal adenoma as well.  Of note she has been on Ozempic since 2023 and has lost significant amount of weight on the regimen.  She has since stopped the Ozempic several months ago.   She is accompanied by her daughter today.  At the last visit I gave the patient some Reglan empirically to see if this would help some of her postprandial symptoms.  She states it does help when she takes it, but sounds like she does not take it routinely.  I gave her 5 mg to take 3 times daily, she states she may take it perhaps once a day at most.  She does have a generalized poor appetite.  She states she  has some occasional nausea and continues to feel full easily when she eats larger meals.  She is having roughly 2 meals per day, eating only a small amount of time.  She thinks she has lost almost 20 pounds since have last seen her because she is not eating well.  She is really not vomiting too much.  Again she has been off of Ozempic and taking pantoprazole daily.  With her weight loss she states that has actually helped her chronic back pain and knee pain, however she is concerned about continued weight loss.  Also, since her last hospitalization, she has had some intermittent loose stools which can vary.  She has had some nocturnal symptoms and episodes of incontinence.  She does take chronic antibiotics, on Duricef for history of spinal infection.  She is overdue for colonoscopy and we discussed if she was able to do that at this time.  She can stand with assistance and walk with a walker.  She also notes that her blood pressure has been erratic and going from either too high or too low, she has a hard time getting a read on this. BP 95/65 in the office today.    Past Medical History:  Diagnosis Date   AIN (anal intraepithelial neoplasia) anal canal    Anemia    with pregnancy   Anxiety    Arthritis  knees, lower back, knees   Chronic constipation    Chronic diastolic CHF (congestive heart failure) (HCC) 07/05/2019   CKD (chronic kidney disease)    Coarse tremors    Essential   COPD (chronic obstructive pulmonary disease) (HCC)    2017 chest CT, history of   Degenerative lumbar spinal stenosis    Depression    Depression 07/05/2019   Drug dependency (HCC)    Elevated PTHrP level 05/09/2019   EtOH use 07/05/2019   Pt denies heavy drinking however   Gastroparesis    GERD (gastroesophageal reflux disease)    pt denies   Gout    Grade I diastolic dysfunction 02/01/2016   Noted ECHO   Hemorrhoids    History of adenomatous polyp of colon    History of basal cell carcinoma  (BCC) excision 2015   nose   History of sepsis 05/14/2014   post lumbar surgery   History of shingles    x2   Hyperlipidemia    Hypertension    Hypothyroidism    Irregular heart beat    history of while undergoing radiation   Malignant neoplasm of upper-inner quadrant of left breast in female, estrogen receptor positive [facility]) oncologist-  dr Darnelle Catalan--  per lov in epic no recurrence   dx 07-13-2015  Left breast invasive ductal carcinoma, Stage IA, Grade 1 (TXN0),  09-16-2015  s/p  left breast lumpectomy with sln bx's,  completed radiation 11-18-2015,  started antiestrogen therapy 12-14-2015   Neuropathy    Left foot and back of left leg   Obesity (BMI 30-39.9) 07/05/2019   Personal history of radiation therapy    completed 11-18-2015  left breast   Pneumonia    PONV (postoperative nausea and vomiting)    Prediabetes    diet controlled, no med   Restless leg syndrome    Seasonal allergies    Seizure (HCC) 05/2015   due to sepsis, only one time episode   Tremor    Wears partial dentures    lower      Past Surgical History:  Procedure Laterality Date   ABDOMINAL HYSTERECTOMY  1985   BASAL CELL CARCINOMA EXCISION  10/15   nose   BIOPSY  07/22/2022   Procedure: BIOPSY;  Surgeon: Meryl Dare, MD;  Location: [facility];  Service: Gastroenterology;;   BREAST BIOPSY Right 08/24/2015   BREAST BIOPSY Right 07/13/2015   BREAST BIOPSY Left 07/13/2015   BREAST EXCISIONAL BIOPSY Left 1972   BREAST LUMPECTOMY Left    BREAST LUMPECTOMY WITH RADIOACTIVE SEED AND SENTINEL LYMPH NODE BIOPSY Left 09/16/2015   Procedure: BREAST LUMPECTOMY WITH RADIOACTIVE SEED AND SENTINEL LYMPH NODE BIOPSY;  Surgeon: Almond Lint, MD;  Location: [facility];  Service: General;  Laterality: Left;   BREAST REDUCTION SURGERY Bilateral 1998   CATARACT EXTRACTION W/ INTRAOCULAR LENS IMPLANT Left 2016   CERVICAL SPINE SURGERY  1995   hallo   COLONOSCOPY  01/2013   polyps    ESOPHAGOGASTRODUODENOSCOPY N/A 07/22/2022   Procedure: ESOPHAGOGASTRODUODENOSCOPY (EGD);  Surgeon: Meryl Dare, MD;  Location: [facility];  Service: Gastroenterology;  Laterality: N/A;   EYE SURGERY Left 2016   HAND SURGERY Left 02-19-2002   dr Merlyn Lot  @[facility]    repair collateral ligament/ MPJ left thumb   HARDWARE REMOVAL N/A 07/24/2019   Procedure: REVISION OF HARDWARE Lumbar two - Sacral one. Extention of Fusion to Lumbar one;  Surgeon: [name], MD;  Location: [facility] OR;  Service: Neurosurgery;  Laterality: N/A;  HEMORRHOID SURGERY  2008   HIGH RESOLUTION ANOSCOPY N/A 02/28/2018   Procedure: HIGH RESOLUTION ANOSCOPY WITH BIOPSY;  Surgeon: Romie Levee, MD;  Location: [facility];  Service: General;  Laterality: N/A;   KNEE ARTHROSCOPY Right 2002   LUMBAR SPINE SURGERY  03-20-2015   dr Conchita Paris   fusion L4-5, L5-S1   MOUTH SURGERY  07/14/2018   2 infected teeth removed   MULTIPLE TOOTH EXTRACTIONS     with bone grafting lower bottom right and left   REDUCTION MAMMAPLASTY Bilateral    RIGHT COLECTOMY  09-10-2003   dr Jamey Ripa @[facility]    multiple colon polyps (per path tubular adenoma's, hyperplastic , benign appendix, benign two lymph nodes   ROTATOR CUFF REPAIR Left 04/2016   TONSILLECTOMY  1969   TUBAL LIGATION Bilateral 1982   Family History  Problem Relation Age of Onset   Atrial fibrillation Mother    Ulcers Father    Breast cancer Maternal Aunt    Lung cancer Maternal Uncle    Stomach cancer Maternal Grandmother    Lung cancer Maternal Aunt    Brain cancer Maternal Aunt    Prostate cancer Maternal Grandfather    Stroke Paternal Grandmother    Tuberculosis Paternal Grandfather    Colon cancer Neg Hx    Esophageal cancer Neg Hx    Rectal cancer Neg Hx    Social History   Tobacco Use   Smoking status: Former    Current packs/day: 0.00    Average packs/day: 0.5 packs/day for 35.0 years (17.5 ttl pk-yrs)    Types: Cigarettes    Start date: 05/23/1983     Quit date: 05/22/2018    Years since quitting: 4.4   Smokeless tobacco: Never  Vaping Use   Vaping status: Never Used  Substance Use Topics   Alcohol use: Yes    Comment: 1 glass of wine a night   Drug use: Not Currently    Comment: CBD tincture   Current Outpatient Medications  Medication Sig Dispense Refill   allopurinol (ZYLOPRIM) 300 MG tablet Take 300 mg by mouth daily.     Biotin 3 MG TABS Take 3 mg by mouth daily.     cefadroxil (DURICEF) 500 MG capsule Take 1 capsule (500 mg total) by mouth 2 (two) times daily. 60 capsule 11   colchicine 0.6 MG tablet Take 2 tablets at once then take 1 tablet an hour later then then 1 tablet twice daily x 3 days. 30 tablet 2   Continuous Glucose Sensor (FREESTYLE LIBRE 3 SENSOR) MISC PLACE 1 SENSOR ON SKIN EVERY 14 DAYS TO CHECK BLOOD GLUCOSE CONTINUOUSLY 2 each 3   diazepam (VALIUM) 10 MG tablet Take 1 tablet (10 mg total) by mouth daily as needed for anxiety. 30 tablet 3   DULoxetine (CYMBALTA) 60 MG capsule Take one daily 90 capsule 0   Fluticasone-Umeclidin-Vilant ([name] ELLIPTA) 100-62.5-25 MCG/ACT AEPB Inhale 1 puff into the lungs daily. 90 each 3   levothyroxine (SYNTHROID) 88 MCG tablet Take 88 mcg by mouth daily.     Magnesium Gluconate 250 MG TABS Take 1 tablet by mouth daily.     pantoprazole (PROTONIX) 40 MG tablet Take 1 tablet (40 mg total) by mouth 2 (two) times daily. 60 tablet 1   polyethylene glycol powder (GLYCOLAX/MIRALAX) 17 GM/SCOOP powder Take 17 g by mouth daily as needed for moderate constipation.     Propylene Glycol (SYSTANE BALANCE OP) Place 1 drop into both eyes 4 (four) times daily as  needed (dry eyes).     rOPINIRole (REQUIP) 1 MG tablet TAKE 1 TABLET(1 MG) BY MOUTH AT BEDTIME 90 tablet 0   Sodium Sulfate-Mag Sulfate-KCl (SUTAB) [phone] MG TABS Take 24 tablets by mouth once for 1 dose. 24 tablet 0   topiramate (TOPAMAX) 25 MG tablet Take 3 tablets (75 mg total) by mouth at bedtime. 90 tablet 3    amitriptyline (ELAVIL) 100 MG tablet Take 1 tablet (100 mg total) by mouth at bedtime. (Patient not taking: Reported on 11/01/2022) 90 tablet 0   atorvastatin (LIPITOR) 80 MG tablet TAKE 1 TABLET(80 MG) BY MOUTH DAILY (Patient not taking: Reported on 11/01/2022) 90 tablet 0   Cholecalciferol (VITAMIN D3) 50 MCG (2000 UT) capsule Take 1 capsule (2,000 Units total) by mouth daily. (Patient not taking: Reported on 11/01/2022) 90 capsule 3   metoCLOPramide (REGLAN) 5 MG tablet Take 1 tablet (5 mg total) by mouth 3 (three) times daily before meals. 90 tablet 5   No current facility-administered medications for this visit.   Allergies  Allergen Reactions   Ampicillin Hives and Other (See Comments)    Severe reaction in February 2017 1.5 month to have hives to go away   Gabapentin Swelling    UNSPECIFIED REACTION    Penicillins Hives and Other (See Comments)    Severe reaction in February 2017 1.5 month to have hives to go away Has patient had a PCN reaction causing immediate rash, facial/tongue/throat swelling, SOB or lightheadedness with hypotension: #  #  #  YES  #  #  #  Has patient had a PCN reaction causing severe rash involving mucus membranes or skin necrosis: No Has patient had a PCN reaction that required hospitalization No Has patient had a PCN reaction occurring within the last 10 years: No    Other Itching    UNSPECIFIED Analgesics     Review of Systems: All systems reviewed and negative except where noted in HPI.    No results found.  Physical Exam: BP 95/65 (BP Location: Right Arm, Patient Position: Sitting, Cuff Size: Normal)   Pulse (!) 113   Ht 5\' 5"  (1.651 m)   Wt 158 lb 4 oz (71.8 kg)   BMI 26.33 kg/m  Constitutional: Pleasant,well-developed, female in no acute distress. Neurological: Alert and oriented to person place and time. Psychiatric: Normal mood and affect. Behavior is normal.   ASSESSMENT: 77 y.o. female here for assessment of the following  1.  Decreased appetite   2. Early satiety   3. Loss of weight   4. Diarrhea, unspecified type   5. History of colon polyps    Several months worth of early satiety, decreased appetite.  Given her poor p.o. intake she has continued to lose weight.  Previously on Ozempic but held and her symptoms have not improved.  EGD was normal but some retained gastric contents concerning for gastroparesis.  She had a CT scan of her abdomen and pelvis and March which showed no concerning problems in her abdomen.  Suspect that she may have gastroparesis/functional change in her gastric emptying posthospitalization.  We had discussed empiric Reglan and she is using that low-dose and intermittently and it does help.  I counseled her on Reglan use and long-term risks of this to include tardive dyskinesia, QTc prolongation etc., at low-dose this would seem to be safe and if it helps her I recommend she increase the dose to 5 mg 3 times every day for the next month.  Talk with her  about gastroparesis diet, she should eat smaller meals, more frequently.  We discussed potentially adding Remeron to her regimen although that could interact with the Elavil and Cymbalta, she should talk with her primary care she wants to consider using Remeron.  She also needs to supplement her oral intake to meet her caloric needs.  She can use protein shakes/weight gain or if needed, we discussed how to do this and she may tolerate liquids better than solids if she has gastroparesis.  I do not think a gastric emptying study will change management right now, will empirically treat her with Reglan, she has had benefit so far on but just needs to use it more routinely.  Otherwise, she has developed some loose stools since have seen her posthospitalization.  Will check a C. difficile sample to make sure negative.  If that is negative we will plan on pursuing colonoscopy at the [facility].  She feels she can tolerate it at this point in time.  I will otherwise  send her to the lab for a fasting a.m. cortisol level in light of her upper tract symptoms, diarrhea, fluctuations in blood pressure.  She is agreeable   PLAN: - take Reglan 5mg  TID prior to meals - discussed risks. It is helping she does not take it enough - continue protonix - consider adding Remeron pending her course - she declines for now, will need to ask her PCP if she wants to pursue this at some point pending her course given potential medication interactions - needs to eat regular meals, use protein / weight gainer supplements - stool for C Diff PCR - lab for fasting AM cortisol level - rule out adrenal insufficiency - colonoscopy to be scheduled at the [facility] - she prefers late August - can start immodium if / when C Diff ruled out  Harlin Rain, MD [facility]

## 2022-11-02 ENCOUNTER — Telehealth: Payer: Self-pay | Admitting: Medical

## 2022-11-02 NOTE — Telephone Encounter (Signed)
Pt called and she is coming to see you Friday. She is wanting to know are you going to do any blood work because her gastro doctor wants her cortisol checked and she rather do it all here and if so she would like to come get it done tomorrow morning.   Im leaving for the day so if this is ok, someone else will need to call and let her know

## 2022-11-02 NOTE — Telephone Encounter (Signed)
Please advise 

## 2022-11-03 ENCOUNTER — Encounter: Payer: Medicare HMO | Attending: Physical Medicine and Rehabilitation | Admitting: Physical Medicine and Rehabilitation

## 2022-11-03 ENCOUNTER — Other Ambulatory Visit: Payer: Self-pay | Admitting: Medical

## 2022-11-03 ENCOUNTER — Encounter: Payer: Self-pay | Admitting: Physical Medicine and Rehabilitation

## 2022-11-03 ENCOUNTER — Other Ambulatory Visit: Payer: Medicare HMO

## 2022-11-03 VITALS — BP 101/67 | HR 123 | Ht 65.0 in | Wt 157.4 lb

## 2022-11-03 DIAGNOSIS — M25512 Pain in left shoulder: Secondary | ICD-10-CM | POA: Diagnosis not present

## 2022-11-03 DIAGNOSIS — E669 Obesity, unspecified: Secondary | ICD-10-CM | POA: Diagnosis not present

## 2022-11-03 DIAGNOSIS — E782 Mixed hyperlipidemia: Secondary | ICD-10-CM

## 2022-11-03 DIAGNOSIS — R197 Diarrhea, unspecified: Secondary | ICD-10-CM

## 2022-11-03 DIAGNOSIS — M359 Systemic involvement of connective tissue, unspecified: Secondary | ICD-10-CM | POA: Diagnosis not present

## 2022-11-03 DIAGNOSIS — M545 Low back pain, unspecified: Secondary | ICD-10-CM | POA: Diagnosis not present

## 2022-11-03 DIAGNOSIS — I700 Atherosclerosis of aorta: Secondary | ICD-10-CM

## 2022-11-03 DIAGNOSIS — M25561 Pain in right knee: Secondary | ICD-10-CM | POA: Diagnosis not present

## 2022-11-03 DIAGNOSIS — Z7409 Other reduced mobility: Secondary | ICD-10-CM | POA: Diagnosis not present

## 2022-11-03 DIAGNOSIS — I10 Essential (primary) hypertension: Secondary | ICD-10-CM

## 2022-11-03 DIAGNOSIS — E876 Hypokalemia: Secondary | ICD-10-CM | POA: Diagnosis not present

## 2022-11-03 DIAGNOSIS — A0811 Acute gastroenteropathy due to Norwalk agent: Secondary | ICD-10-CM | POA: Diagnosis not present

## 2022-11-03 DIAGNOSIS — G629 Polyneuropathy, unspecified: Secondary | ICD-10-CM | POA: Diagnosis not present

## 2022-11-03 DIAGNOSIS — F419 Anxiety disorder, unspecified: Secondary | ICD-10-CM

## 2022-11-03 DIAGNOSIS — R600 Localized edema: Secondary | ICD-10-CM

## 2022-11-03 DIAGNOSIS — G8929 Other chronic pain: Secondary | ICD-10-CM | POA: Diagnosis not present

## 2022-11-03 DIAGNOSIS — E038 Other specified hypothyroidism: Secondary | ICD-10-CM

## 2022-11-03 DIAGNOSIS — E118 Type 2 diabetes mellitus with unspecified complications: Secondary | ICD-10-CM

## 2022-11-03 DIAGNOSIS — R000 Tachycardia, unspecified: Secondary | ICD-10-CM | POA: Diagnosis not present

## 2022-11-03 DIAGNOSIS — M25562 Pain in left knee: Secondary | ICD-10-CM | POA: Diagnosis not present

## 2022-11-03 DIAGNOSIS — K3184 Gastroparesis: Secondary | ICD-10-CM | POA: Diagnosis not present

## 2022-11-03 MED ORDER — TOPIRAMATE 25 MG PO TABS: 75.0000 mg | ORAL_TABLET | Freq: Every evening | ORAL | 3 refills | Status: DC

## 2022-11-03 MED ORDER — MIDODRINE HCL 2.5 MG PO TABS: 2.5000 mg | ORAL_TABLET | Freq: Every day | ORAL | 3 refills | Status: DC | PRN

## 2022-11-03 MED ORDER — DIAZEPAM 10 MG PO TABS: 10.0000 mg | ORAL_TABLET | Freq: Every day | ORAL | 3 refills | Status: DC | PRN

## 2022-11-03 NOTE — Progress Notes (Signed)
Subjective:    Patient ID: Brandi Stephens, female    DOB: July 24, 1945, 77 y.o.   MRN: [id number]   Brandi Stephens presents for f/u after CIR admission for staph bacteremia after infection of her spinal hardware, and for neuropathy. Everything is the same since last visit. She is having more trouble with neuropathy in her feet and hands. The Cymbalta 60mg  does help somewhat. She used to get swelling with Gabapentin. The pain is bad during the day and night. Pain is improved to 4. She cannot feel her two little toes. Discussed Lyrica as an option. She would like try the Sprint PNS. Her back pain is the most severe. She has hardware. She follows with Dr. Bonnita Nasuti at the end of the month. The pain in her back feels like a dagger. The pain is more directly on the spine.   Has recently been falling again Neuropathy has her feet so severe that she cannot feel her lower extremities, she notes the neuropathy is worsening, but she had excellent relief from Qutenza for 2 weeks after first application. After the most recent application she has had severe burning pain around her ankles and along the 5th digit. It has kept her up at night and she was not able to enjoy her 76th birthday party. The Valium has not been enough to control this pain. She asks for something stronger. The Cymbalta helps her upper extremity neuropathy but not her lower extremity neuropathy. Her Valium needs to be refilled. She asks if the dose can be increased. She does not take every day. The Valium helps with her pain as well.  She asks if we can go higher on the Topamax.   The Amitriptyline helps her to sleep a little better. She has improved sleep from 1 to 4 hours. She is having severe neuropathy in her left foot. The Cymbalta is helping with the neuropathic pain in her hands. She has been tolerating this medication well.   Last visit I transitioned her to outaptient therapy and she has an appointment today. She has noted improvements in  her strength. She is ambulating steadily with RW today. She has bene trying using a cane with therapy. Outpatient therapy is doing well. She has been taking 1/2 a tablet of hydrocodone to help her tolerate better (prescribed by her surgeon).   Pain is currently 5/10 and worst in her lower back where her spinal harware is present as well as in paraspinals. No longer acquiring She has been taking approximately 1 percocet per day and would like to gradually stop using these. She has also been using four 650mg  of Tylenol per day and two 200mg  ibuprofen daily.   She no longer has severe diarrhea or constipation and is much happier about this. It does come and go. She takes 1000mg  magensium at night. She takes colace BID. She likes to eat healthy foods.   Her daughter accompanies her this visit and helps to provide history. She and her daughter have been out of work for 5 years due to her health conditions and need for her daughter's assistance. Her grandchild recently graduated kindergarten.   1) B/l lower extremity edema She continues to have mild bilateral lower extremity edema.  Discussed use of compression garments, elevation, and ice.  Discussed the benefits of lymphedema therapy and she is willing to try this.  -She has been having pooling of fluids throughout her body -She has never seen nephrology but has followed with cardiology -Lasix administration is  limited due to her creatinine.  -improved after Qutenza administration on Friday  2) Bilateral knees: -she has fallen on her knees so many times.  -she has been using blue emu oil. -she has been using voltaren gel.  -she has a history of arthroscopic surgery and steroid injections -she uses turmeric  3) Muscle spasms -the Valium does help  4) Peripheral neuropathy: -60mg  Cymbalta is helping. -improved after administration of Qutenza on Friday -she did have some burning on ankle and shin after Qutenza and this has improved after she  used the cooling gel -she loves the pain journal and keeps it by her -asks about the topamax -it is really affecting her quality of life  5) Prediabetes -has been eating very healthy foods  6) Anxiety: -they Valium helps- she uses as needed.  -she appreciates her husband's and daughter's care.   5) Dizziness -feels related to going out, anxiety.  6) obesity -has been trying to lose weight  7) Norovirus: -she was nauseous that she was falling -she was so dizzy and dehydrated  8) Orthostatic hypotension -[place] where she is on single floor  9) Gastroparesis: -saw GI on Tuesday -she was hospitalized  -she has lost 40 lbs -she took ozempic  10) s/p severe norovirus: -she was near sepsis   Pain Inventory Average Pain 6 Pain Right Now 7 My pain is constant and aching  In the last 24 hours, has pain interfered with the following? General activity 7 Relation with others 7 Enjoyment of life 7 What TIME of day is your pain at its worst?  varies Sleep (in general) Poor  Pain is worse with: walking, bending, sitting and standing Pain improves with: rest, heat/ice, therapy/exercise and medication Relief from Meds: 2   Evening and night     Family History  Problem Relation Age of Onset   Atrial fibrillation Mother    Ulcers Father    Breast cancer Maternal Aunt    Lung cancer Maternal Uncle    Stomach cancer Maternal Grandmother    Lung cancer Maternal Aunt    Brain cancer Maternal Aunt    Prostate cancer Maternal Grandfather    Stroke Paternal Grandmother    Tuberculosis Paternal Grandfather    Colon cancer Neg Hx    Esophageal cancer Neg Hx    Rectal cancer Neg Hx    Social History   Socioeconomic History   Marital status: Married    Spouse name: robert   Number of children: 3   Years of education: college   Highest education level: Not on file  Occupational History   Occupation: retired  Tobacco Use   Smoking status: Former     Current packs/day: 0.00    Average packs/day: 0.5 packs/day for 35.0 years (17.5 ttl pk-yrs)    Types: Cigarettes    Start date: 05/23/1983    Quit date: 05/22/2018    Years since quitting: 4.4   Smokeless tobacco: Never  Vaping Use   Vaping status: Never Used  Substance and Sexual Activity   Alcohol use: Yes    Comment: 1 glass of wine a night   Drug use: Not Currently    Comment: CBD tincture   Sexual activity: Yes    Birth control/protection: Post-menopausal  Other Topics Concern   Not on file  Social History Narrative   Lives with husband in a 2 story home.  Has 2 living children.  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  Education:  college.    Right-handed.   Five cups coffee per week.   Social Determinants of Health   Financial Resource Strain: Low Risk  (03/18/2022)   Overall Financial Resource Strain (CARDIA)    Difficulty of Paying Living Expenses: Not hard at all  Food Insecurity: No Food Insecurity (07/18/2022)   Hunger Vital Sign    Worried About Running Out of Food in the Last Year: Never true    Ran Out of Food in the Last Year: Never true  Transportation Needs: No Transportation Needs (07/18/2022)   PRAPARE - Administrator, Civil Service (Medical): No    Lack of Transportation (Non-Medical): No  Physical Activity: Inactive (03/18/2022)   Exercise Vital Sign    Days of Exercise per Week: 0 days    Minutes of Exercise per Session: 0 min  Stress: No Stress Concern Present (03/18/2022)   [employer] of Occupational Health - Occupational Stress Questionnaire    Feeling of Stress : Only a little  Social Connections: Not on file   Past Surgical History:  Procedure Laterality Date   ABDOMINAL HYSTERECTOMY  1985   BASAL CELL CARCINOMA EXCISION  10/15   nose   BIOPSY  07/22/2022   Procedure: BIOPSY;  Surgeon: Meryl Dare, MD;  Location: [facility];  Service: Gastroenterology;;   BREAST BIOPSY Right 08/24/2015   BREAST BIOPSY Right  07/13/2015   BREAST BIOPSY Left 07/13/2015   BREAST EXCISIONAL BIOPSY Left 1972   BREAST LUMPECTOMY Left    BREAST LUMPECTOMY WITH RADIOACTIVE SEED AND SENTINEL LYMPH NODE BIOPSY Left 09/16/2015   Procedure: BREAST LUMPECTOMY WITH RADIOACTIVE SEED AND SENTINEL LYMPH NODE BIOPSY;  Surgeon: Almond Lint, MD;  Location: [facility];  Service: General;  Laterality: Left;   BREAST REDUCTION SURGERY Bilateral 1998   CATARACT EXTRACTION W/ INTRAOCULAR LENS IMPLANT Left 2016   CERVICAL SPINE SURGERY  1995   hallo   COLONOSCOPY  01/2013   polyps   ESOPHAGOGASTRODUODENOSCOPY N/A 07/22/2022   Procedure: ESOPHAGOGASTRODUODENOSCOPY (EGD);  Surgeon: Meryl Dare, MD;  Location: [facility];  Service: Gastroenterology;  Laterality: N/A;   EYE SURGERY Left 2016   HAND SURGERY Left 02-19-2002   dr Merlyn Lot  @[facility]    repair collateral ligament/ MPJ left thumb   HARDWARE REMOVAL N/A 07/24/2019   Procedure: REVISION OF HARDWARE Lumbar two - Sacral one. Extention of Fusion to Lumbar one;  Surgeon: [name], MD;  Location: [facility] OR;  Service: Neurosurgery;  Laterality: N/A;   HEMORRHOID SURGERY  2008   HIGH RESOLUTION ANOSCOPY N/A 02/28/2018   Procedure: HIGH RESOLUTION ANOSCOPY WITH BIOPSY;  Surgeon: Romie Levee, MD;  Location: [facility];  Service: General;  Laterality: N/A;   KNEE ARTHROSCOPY Right 2002   LUMBAR SPINE SURGERY  03-20-2015   dr Conchita Paris   fusion L4-5, L5-S1   MOUTH SURGERY  07/14/2018   2 infected teeth removed   MULTIPLE TOOTH EXTRACTIONS     with bone grafting lower bottom right and left   REDUCTION MAMMAPLASTY Bilateral    RIGHT COLECTOMY  09-10-2003   dr Jamey Ripa @[facility]    multiple colon polyps (per path tubular adenoma's, hyperplastic , benign appendix, benign two lymph nodes   ROTATOR CUFF REPAIR Left 04/2016   TONSILLECTOMY  1969   TUBAL LIGATION Bilateral 1982   Past Medical History:  Diagnosis Date   AIN (anal intraepithelial neoplasia) anal  canal    Anemia    with pregnancy   Anxiety  Arthritis    knees, lower back, knees   Chronic constipation    Chronic diastolic CHF (congestive heart failure) (HCC) 07/05/2019   CKD (chronic kidney disease)    Coarse tremors    Essential   COPD (chronic obstructive pulmonary disease) (HCC)    2017 chest CT, history of   Degenerative lumbar spinal stenosis    Depression    Depression 07/05/2019   Drug dependency (HCC)    Elevated PTHrP level 05/09/2019   EtOH use 07/05/2019   Pt denies heavy drinking however   Gastroparesis    GERD (gastroesophageal reflux disease)    pt denies   Gout    Grade I diastolic dysfunction 02/01/2016   Noted ECHO   Hemorrhoids    History of adenomatous polyp of colon    History of basal cell carcinoma (BCC) excision 2015   nose   History of sepsis 05/14/2014   post lumbar surgery   History of shingles    x2   Hyperlipidemia    Hypertension    Hypothyroidism    Irregular heart beat    history of while undergoing radiation   Malignant neoplasm of upper-inner quadrant of left breast in female, estrogen receptor positive [facility]) oncologist-  dr Darnelle Catalan--  per lov in epic no recurrence   dx 07-13-2015  Left breast invasive ductal carcinoma, Stage IA, Grade 1 (TXN0),  09-16-2015  s/p  left breast lumpectomy with sln bx's,  completed radiation 11-18-2015,  started antiestrogen therapy 12-14-2015   Neuropathy    Left foot and back of left leg   Obesity (BMI 30-39.9) 07/05/2019   Personal history of radiation therapy    completed 11-18-2015  left breast   Pneumonia    PONV (postoperative nausea and vomiting)    Prediabetes    diet controlled, no med   Restless leg syndrome    Seasonal allergies    Seizure (HCC) 05/2015   due to sepsis, only one time episode   Tremor    Wears partial dentures    lower    There were no vitals taken for this visit.  Opioid Risk Score:   Fall Risk Score:  `1  Depression screen [facility] 2/9     07/14/2022    11:02 AM 05/17/2022    2:28 PM 03/18/2022   11:40 AM 11/19/2021    1:40 PM 08/25/2021   10:32 AM 08/24/2021    9:36 AM 08/10/2021    2:37 PM  Depression screen PHQ 2/9  Decreased Interest 0 0 0 0 0 0 0  Down, Depressed, Hopeless 0 0 0 0 0 0 0  PHQ - 2 Score 0 0 0 0 0 0 0  Altered sleeping   1 0     Tired, decreased energy   1 1     Change in appetite   0 0     Feeling bad or failure about yourself    0 0     Trouble concentrating   0 0     Moving slowly or fidgety/restless   0 0     Suicidal thoughts   0 0     PHQ-9 Score   2 1     Difficult doing work/chores   Not difficult at all Not difficult at all       Review of Systems  Constitutional: Negative.   HENT: Negative.    Eyes: Negative.   Respiratory: Negative.    Cardiovascular: Negative.   Gastrointestinal: Negative.   Endocrine: Negative.  Genitourinary: Negative.   Musculoskeletal:  Positive for gait problem.  Skin: Negative.   Allergic/Immunologic: Negative.   Neurological:  Positive for dizziness, tremors, weakness and numbness.       Tingling   Hematological: Negative.   Psychiatric/Behavioral: Negative.    All other systems reviewed and are negative.      Objective:   Physical Exam Gen: no distress, normal appearing HEENT: oral mucosa pink and moist, NCAT Cardio: Reg rate Chest: normal effort, normal rate of breathing Abd: soft, non-distended Ext: no edema Psych: pleasant, normal affect Skin: intact Neuro: Alert and oriented x3  Assessment & Plan:  Brandi Stephens is a 77 year old female who presents for f/u of neuropathy and below conditions.   1) Diarrhea: This was a huge issue previously and has since improved. I had obtained an XR that showed constipation and recommended an enema. Continue magnesium 1000mg  at night and colace BID. This continues but is improved.  -recommended shredded coconut  2) Impaired mobility and ADLs following spinal surgery: -Continue HEP Progressing well. Increase walking to 10  minutes per day -Recommended exercise bike at home to build back endurance. Continue walking outside as well as strength and gait are improving and she is no longer limited by diarrhea. -She has been having a lot of falls at home  3) Post-operative lower back pain: -Stopped oxycodone- was prescribed 1/2 pill hydrocodone by her her spine surgery.  -Continue to supplement with icing, Tyelnol, and Ibuprofen.  -recommended topamax recommended to clear with cardiology first  4) Peripheral neuropathy: -Discussed Sprint PNS system as an option of pain treatment via neuromodulation. Provided following link for patient to learn more about the system: https://www.sprtherapeutics.com/. She would like to try this. Will target sciatic nerve at the popliteal fossa.  -refilled amitriptyline -will defer Qutenza -next time will only use 4 patches given increase in pain with 6 patches -will prescribe 5-7 day course of prn Norco to help with her current severe pain from the Qutenza application -will increase her Amitriptyline to 100mg .  -discussed that symptoms should abate soon, and to please keep me updated.    -discussed   Great benefit- schedule for the rest of the year every 3 months Continue keeping pain journal Increase topamax to 75mg  HS  5) Anixety: refilled Valium to 10mg  daily prn. Provided refill. Has been taking twice per day and this has been helping her. Also helps with her muscle spasms. This has been well controlled. She is sometimes upset that she is only comfortably horizontally.  -discussed that she is taking the Valium about once per week.  -continue cymbalta and amitritpyline  6) Tachycardia: provided higher dose of metoprolol which she was on before last visit. HR is still slightly elevated today, but BP is low. Maintain current dose. She says BP was elevated at her doctor's appointment yesterday. It fluctuates.   7) Lower extremity edema: advised elevation, ice, and compression  garments which she is already doing.  -continue Lasix -start lymphedema therapy -continue Qutenza q46months  8) Hot flashes: from Anastrazole.   Home support: Living with husband. Daughter lives 5 miles away.   9) Bilateral knee pain: -continue voltaren gel and blue emu oil -recommended bacopa -continue turmeric   10) Obesity -commended on weight loss! -Educated regarding health benefits of weight loss- for pain, general health, chronic disease prevention, immune health, mental health.  -Will monitor weight every visit.  -Consider Roobois tea daily.  -Discussed the benefits of intermittent fasting. -Discussed foods that can  assist in weight loss: 1) leafy greens- high in fiber and nutrients 2) dark chocolate- improves metabolism (if prefer sweetened, best to sweeten with honey instead of sugar).  3) cruciferous vegetables- high in fiber and protein 4) full fat yogurt: high in healthy fat, protein, calcium, and probiotics 5) apples- high in a variety of phytochemicals 6) nuts- high in fiber and protein that increase feelings of fullness 7) grapefruit: rich in nutrients, antioxidants, and fiber (not to be taken with anticoagulation) 8) beans- high in protein and fiber 9) salmon- has high quality protein and healthy fats 10) green tea- rich in polyphenols 11) eggs- rich in choline and vitamin D 12) tuna- high protein, boosts metabolism 13) avocado- decreases visceral abdominal fat 14) chicken (pasture raised): high in protein and iron 15) blueberries- reduce abdominal fat and cholesterol 16) whole grains- decreases calories retained during digestion, speeds metabolism 17) chia seeds- curb appetite 18) chilies- increases fat metabolism  -Discussed supplements that can be used:  1) Metatrim 400mg  BID 30 minutes before breakfast and dinner  2) Sphaeranthus indicus and Garcinia mangostana (combinations of these and #1 can be found in capsicum and zychrome  3) green coffee bean  extract 400mg  twice per day or Irvingia (african mango) 150 to 300mg  twice per day.  11) Onchomycosis: -prescribed Miconazole BID  12) Orthostatic hypotension: -recently diagnosed -continue midodrine -encouraged drinking water with a pinch of salt  13) Norovirus -discussed recent hospitalization.  -discussed that she is on Protonix for 2 weeks -discussed that she still has nausea and upset stomach on occasion -discussed that she was near sepsis  14) Hypoglycemia: -discussed that blood sugar decreased to 50s  15) Impaired mobility and ADLs: -continue home health -discussed increasing walking daily and doing resistance exercise  16) Hypotension -discussed that she does feel dizzy -discussed that she is being checked for adrenal insufficiency  -discussed ted hose and that she has tried many kinds and getting them on is tough  17) Bilateral edema: -discussed that this has improved.   18) Gastroparesis: -discussed that she tolerated the salad, black bean soup, lentil soup -encouraged miso-ginger  19) Left shoulder pain:  -discussed heating pad Discussed extracorporeal shockwave therapy as a modality for treatment. Discussed that the device looks and feels like a massage gun and I would move it over the area of pain for about 10 minutes. The device releases sound waves to the area of pain and helps to improve blood flow and circulation to improve the healing process. Discuss that this initially induces inflammation and can sometimes cause short-term increase in pain. Discussed that we typically do three weekly treatments, but sometimes up to 6 if needed, and after 6 weeks long term benefits can sometimes be achieved. Discussed that this is an FDA approved device, but not covered by insurance and would cost $60 per session. Will scheduled patient for 6 consecutive appointments and can cancel latter three if benefits are achieved after first three sessions.

## 2022-11-03 NOTE — Patient Instructions (Addendum)
B12/meythlfolate/B6  PEA   Castor oil/cayenne pepper  Coconut oil, frankkinense and myrrh

## 2022-11-03 NOTE — Telephone Encounter (Signed)
Pt coming in today

## 2022-11-04 ENCOUNTER — Ambulatory Visit: Payer: Medicare HMO

## 2022-11-04 ENCOUNTER — Ambulatory Visit (INDEPENDENT_AMBULATORY_CARE_PROVIDER_SITE_OTHER): Payer: Medicare HMO | Admitting: Medical

## 2022-11-04 ENCOUNTER — Encounter: Payer: PPO | Admitting: Adult Health

## 2022-11-04 VITALS — BP 92/58 | HR 128 | Wt 158.8 lb

## 2022-11-04 DIAGNOSIS — E782 Mixed hyperlipidemia: Secondary | ICD-10-CM

## 2022-11-04 DIAGNOSIS — I5032 Chronic diastolic (congestive) heart failure: Secondary | ICD-10-CM | POA: Diagnosis not present

## 2022-11-04 DIAGNOSIS — R7989 Other specified abnormal findings of blood chemistry: Secondary | ICD-10-CM | POA: Diagnosis not present

## 2022-11-04 DIAGNOSIS — M359 Systemic involvement of connective tissue, unspecified: Secondary | ICD-10-CM | POA: Diagnosis not present

## 2022-11-04 DIAGNOSIS — E038 Other specified hypothyroidism: Secondary | ICD-10-CM | POA: Diagnosis not present

## 2022-11-04 DIAGNOSIS — Z86010 Personal history of colonic polyps: Secondary | ICD-10-CM | POA: Diagnosis not present

## 2022-11-04 DIAGNOSIS — R197 Diarrhea, unspecified: Secondary | ICD-10-CM

## 2022-11-04 DIAGNOSIS — Z79899 Other long term (current) drug therapy: Secondary | ICD-10-CM

## 2022-11-04 DIAGNOSIS — R6881 Early satiety: Secondary | ICD-10-CM | POA: Diagnosis not present

## 2022-11-04 DIAGNOSIS — J431 Panlobular emphysema: Secondary | ICD-10-CM | POA: Diagnosis not present

## 2022-11-04 DIAGNOSIS — K3184 Gastroparesis: Secondary | ICD-10-CM

## 2022-11-04 DIAGNOSIS — R630 Anorexia: Secondary | ICD-10-CM

## 2022-11-04 DIAGNOSIS — R634 Abnormal weight loss: Secondary | ICD-10-CM

## 2022-11-04 DIAGNOSIS — R0989 Other specified symptoms and signs involving the circulatory and respiratory systems: Secondary | ICD-10-CM | POA: Diagnosis not present

## 2022-11-04 DIAGNOSIS — E118 Type 2 diabetes mellitus with unspecified complications: Secondary | ICD-10-CM | POA: Diagnosis not present

## 2022-11-04 LAB — CBC
Hematocrit: 44.4 % (ref 34.0–46.6)
Hemoglobin: 14.4 g/dL (ref 11.1–15.9)
MCH: 30.8 pg (ref 26.6–33.0)
MCHC: 32.4 g/dL (ref 31.5–35.7)
MCV: 95 fL (ref 79–97)
Platelets: 302 10*3/uL (ref 150–450)
RBC: 4.67 x10E6/uL (ref 3.77–5.28)
RDW: 13.2 % (ref 11.7–15.4)
WBC: 7.1 10*3/uL (ref 3.4–10.8)

## 2022-11-04 LAB — COMPREHENSIVE METABOLIC PANEL
ALT: 15 IU/L (ref 0–32)
AST: 18 IU/L (ref 0–40)
Albumin: 4.1 g/dL (ref 3.8–4.8)
Alkaline Phosphatase: 105 IU/L (ref 44–121)
BUN/Creatinine Ratio: 17 (ref 12–28)
BUN: 17 mg/dL (ref 8–27)
Bilirubin Total: 0.6 mg/dL (ref 0.0–1.2)
CO2: 20 mmol/L (ref 20–29)
Calcium: 10.9 mg/dL — ABNORMAL HIGH (ref 8.7–10.3)
Chloride: 105 mmol/L (ref 96–106)
Creatinine, Ser: 1.02 mg/dL — ABNORMAL HIGH (ref 0.57–1.00)
Globulin, Total: 1.9 g/dL (ref 1.5–4.5)
Glucose: 100 mg/dL — ABNORMAL HIGH (ref 70–99)
Potassium: 3.9 mmol/L (ref 3.5–5.2)
Sodium: 143 mmol/L (ref 134–144)
Total Protein: 6 g/dL (ref 6.0–8.5)
eGFR: 57 mL/min/{1.73_m2} — ABNORMAL LOW (ref >59)

## 2022-11-04 LAB — LIPID PANEL
Chol/HDL Ratio: 3.9 ratio (ref 0.0–4.4)
Cholesterol, Total: 219 mg/dL — ABNORMAL HIGH (ref 100–199)
HDL: 56 mg/dL (ref >39)
LDL Chol Calc (NIH): 134 mg/dL — ABNORMAL HIGH (ref 0–99)
Triglycerides: 162 mg/dL — ABNORMAL HIGH (ref 0–149)
VLDL Cholesterol Cal: 29 mg/dL (ref 5–40)

## 2022-11-04 LAB — MICROALBUMIN / CREATININE URINE RATIO
Creatinine, Urine: 167.3 mg/dL
Microalb/Creat Ratio: 20 mg/g creat (ref 0–29)
Microalbumin, Urine: 33 ug/mL

## 2022-11-04 LAB — HEMOGLOBIN A1C
Est. average glucose Bld gHb Est-mCnc: 100 mg/dL
Hgb A1c MFr Bld: 5.1 % (ref 4.8–5.6)

## 2022-11-04 LAB — MAGNESIUM: Magnesium: 1.7 mg/dL (ref 1.6–2.3)

## 2022-11-04 LAB — TSH: TSH: 0.03 u[IU]/mL — ABNORMAL LOW (ref 0.450–4.500)

## 2022-11-04 LAB — CORTISOL: Cortisol: 11.7 ug/dL (ref 6.2–19.4)

## 2022-11-04 MED ORDER — LEVOTHYROXINE SODIUM 75 MCG PO TABS: 75.0000 ug | ORAL_TABLET | Freq: Every day | ORAL | 2 refills | Status: DC

## 2022-11-04 NOTE — Patient Instructions (Signed)
Recommendations: Lower thyroid dose to , new prescription sent today Recheck labs in 1 month Increase fluids to at least 80 ounces daily Take a midodrine this evening 2.5mg , and any time BPs are staying < 100/60 use the midodrine up to 3 times daily Consider monitoring BP 2-3 times daily for a week to get a feel for where your pressure are running

## 2022-11-04 NOTE — Progress Notes (Signed)
Subjective:  Brandi Stephens is a 77 y.o. female who presents for Chief Complaint  Patient presents with   follow-up    Follow-up, no appetite, seeing GI for stomach,      Patient Care Team: Nixon Kolton, Kermit Balo, PA-C as PCP - General (Family Medicine) Rennis Golden Lisette Abu, MD as PCP - Cardiology (Cardiology) Magrinat, Valentino Hue, MD as Consulting Physician (Oncology) Almond Lint, MD as Consulting Physician (General Surgery) Ronnald Nian, MD as Consulting Physician (Family Medicine) [name], MD as Consulting Physician (Neurosurgery) Mathews Robinsons, MD as Consulting Physician (Dermatology) Axel Filler, Larna Daughters, NP as Nurse Practitioner (Hematology and Oncology) Teryl Lucy, MD as Consulting Physician (Orthopedic Surgery) Odette Fraction, MD (Inactive) as Consulting Physician (Anesthesiology) Armbruster, Willaim Rayas, MD as Consulting Physician (Gastroenterology) [name], MD as Consulting Physician (Neurosurgery) Levert Feinstein, MD as Consulting Physician (Neurology) Verner Chol, [facility] as Pharmacist (Pharmacist)   Accompanied by her daughter  Here for med check   Still living at [facility], in town this week.  Brandi Stephens is a 77 yo female with past medical history of diastolic CHF, COPD on O2, CKD-3A, HTN, HLD.  Saw GI doctor yesterday.  Is being treated for gastroparesis.    In April 179lb, now at 158lb, thought to be combination of prior ozempic therapy, gastroparesis, poor appetite at times.   Had lost weight on ozempic, but then was having issues, hospitalization back in April 2024, falls, lightheaded, weakness,  and went off ozempic.  Even being off ozempic a while now, doesn't have much appetite at all.  Gets full really quickly.  Just saw GI.  Has stool cultures pending for C diff.  Was put on Reglan a while back but GI recently increased to TID.    Trying to work on eating healthy diet, concentrating on whole foods, protein, fiber.  She quit  Lipitor in recent months and feels her cognition is better. Was feeling mental fog on statin.  COPD - on trelegy, doesn't use every day, but doing ok with breathing.  No longer on oxygen  Still doing home PT once weekly  Here to discuss labs, had labs done yesterday  BP has been a little fluctuating.  Sometimes low like today's reading, sometimes high such as 160 SBP. Has midodrine to use if running low like it was running.  Drinks 60 ounces water daily  Continues on Topamax for nerve pain, feet hurt all the time, requested recent podiatry referral  No other aggravating or relieving factors.    No other c/o.  Past Medical History:  Diagnosis Date   AIN (anal intraepithelial neoplasia) anal canal    Anemia    with pregnancy   Anxiety    Arthritis    knees, lower back, knees   Chronic constipation    Chronic diastolic CHF (congestive heart failure) (HCC) 07/05/2019   CKD (chronic kidney disease)    Coarse tremors    Essential   COPD (chronic obstructive pulmonary disease) (HCC)    2017 chest CT, history of   Degenerative lumbar spinal stenosis    Depression    Depression 07/05/2019   Drug dependency (HCC)    Elevated PTHrP level 05/09/2019   EtOH use 07/05/2019   Pt denies heavy drinking however   Gastroparesis    GERD (gastroesophageal reflux disease)    pt denies   Gout    Grade I diastolic dysfunction 02/01/2016   Noted ECHO   Hemorrhoids    History of adenomatous polyp of colon  History of basal cell carcinoma (BCC) excision 2015   nose   History of sepsis 05/14/2014   post lumbar surgery   History of shingles    x2   Hyperlipidemia    Hypertension    Hypothyroidism    Irregular heart beat    history of while undergoing radiation   Malignant neoplasm of upper-inner quadrant of left breast in female, estrogen receptor positive [facility]) oncologist-  dr Darnelle Catalan--  per lov in epic no recurrence   dx 07-13-2015  Left breast invasive ductal carcinoma, Stage IA,  Grade 1 (TXN0),  09-16-2015  s/p  left breast lumpectomy with sln bx's,  completed radiation 11-18-2015,  started antiestrogen therapy 12-14-2015   Neuropathy    Left foot and back of left leg   Obesity (BMI 30-39.9) 07/05/2019   Personal history of radiation therapy    completed 11-18-2015  left breast   Pneumonia    PONV (postoperative nausea and vomiting)    Prediabetes    diet controlled, no med   Restless leg syndrome    Seasonal allergies    Seizure (HCC) 05/2015   due to sepsis, only one time episode   Tremor    Wears partial dentures    lower    Current Outpatient Medications on File Prior to Visit  Medication Sig Dispense Refill   allopurinol (ZYLOPRIM) 300 MG tablet Take 300 mg by mouth daily.     Biotin 3 MG TABS Take 3 mg by mouth daily.     cefadroxil (DURICEF) 500 MG capsule Take 1 capsule (500 mg total) by mouth 2 (two) times daily. 60 capsule 11   Cholecalciferol (VITAMIN D3) 50 MCG (2000 UT) capsule Take 1 capsule (2,000 Units total) by mouth daily. 90 capsule 3   Continuous Glucose Sensor (FREESTYLE LIBRE 3 SENSOR) MISC PLACE 1 SENSOR ON SKIN EVERY 14 DAYS TO CHECK BLOOD GLUCOSE CONTINUOUSLY 2 each 3   diazepam (VALIUM) 10 MG tablet Take 1 tablet (10 mg total) by mouth daily as needed for anxiety. 90 tablet 3   DULoxetine (CYMBALTA) 60 MG capsule Take one daily 90 capsule 0   Fluticasone-Umeclidin-Vilant ([name] ELLIPTA) 100-62.5-25 MCG/ACT AEPB Inhale 1 puff into the lungs daily. 90 each 3   levothyroxine (SYNTHROID) 88 MCG tablet Take 88 mcg by mouth daily.     metoCLOPramide (REGLAN) 5 MG tablet Take 1 tablet (5 mg total) by mouth 3 (three) times daily before meals. 90 tablet 5   polyethylene glycol powder (GLYCOLAX/MIRALAX) 17 GM/SCOOP powder Take 17 g by mouth daily as needed for moderate constipation.     Propylene Glycol (SYSTANE BALANCE OP) Place 1 drop into both eyes 4 (four) times daily as needed (dry eyes).     rOPINIRole (REQUIP) 1 MG tablet TAKE 1  TABLET(1 MG) BY MOUTH AT BEDTIME 90 tablet 0   topiramate (TOPAMAX) 25 MG tablet Take 3 tablets (75 mg total) by mouth at bedtime. 270 tablet 3   colchicine 0.6 MG tablet Take 2 tablets at once then take 1 tablet an hour later then then 1 tablet twice daily x 3 days. (Patient not taking: Reported on 11/04/2022) 30 tablet 2   pantoprazole (PROTONIX) 40 MG tablet Take 1 tablet (40 mg total) by mouth 2 (two) times daily. 60 tablet 1   No current facility-administered medications on file prior to visit.     The following portions of the patient's history were reviewed and updated as appropriate: allergies, current medications, past family history, past medical history,  past social history, past surgical history and problem list.  ROS Otherwise as in subjective above     Objective: BP (!) 92/58   Pulse (!) 128   Wt 158 lb 12.8 oz (72 kg)   SpO2 97%   BMI 26.43 kg/m   Wt Readings from Last 3 Encounters:  11/04/22 158 lb 12.8 oz (72 kg)  11/03/22 157 lb 6.4 oz (71.4 kg)  11/01/22 158 lb 4 oz (71.8 kg)   BP Readings from Last 3 Encounters:  11/04/22 (!) 92/58  11/03/22 101/67  11/01/22 95/65    General appearance: alert, no distress, well developed, well nourished HEENT: normocephalic, sclerae anicteric, conjunctiva pink and moist, TMs pearly, nares patent, no discharge or erythema, pharynx normal Oral cavity: MMM, no lesions Neck: supple, no lymphadenopathy, no thyromegaly, no masses, no JVD or bruit Heart: RRR, normal S1, S2, no murmurs Lungs: decreased in general, no wheezes, rhonchi, or rales Pulses: 2+ radial pulses, 2+ pedal pulses, normal cap refill Ext: no edema   Assessment: Encounter Diagnoses  Name Primary?   Labile blood pressure Yes   Serum calcium elevated    Elevated serum creatinine    Abnormal thyroid blood test    Autoimmune disease (HCC)    Chronic diastolic CHF (congestive heart failure) (HCC)    Panlobular emphysema (HCC)    High risk medication use     Type II diabetes mellitus with complication (HCC)    Mixed dyslipidemia    Other specified hypothyroidism      Plan: Labile BP If BP <100/60, can use Midodrine 2.5mg  BID or up to TID. Increase water intake to 80-100 ounces daily Monitor BP regularly for now to get better idea of BPs  hyperlipidemia  She wants to remain off statin for now given mental fog.  Discussed lipids, medicaiton options and risks/benefits Reviewed her labs from yesterday, not quite to goal  Elevated calcium - recheck in 59mo  Hypothyroidism Decrease dose of levothyroxine Recheck labs in 59mo Given prior lability of lab results, consider Synthroid name brand, other medicaiton option  Diabetes  Off medication doing fine since losing weight over recent months  Gastroparesis On Reglan, recently GI increased dose  COPD - no recent issues Off oxygen for months Using [name] most day    Trica was seen today for follow-up.  Diagnoses and all orders for this visit:  Labile blood pressure  Serum calcium elevated  Elevated serum creatinine  Abnormal thyroid blood test  Autoimmune disease (HCC)  Chronic diastolic CHF (congestive heart failure) (HCC)  Panlobular emphysema (HCC)  High risk medication use  Type II diabetes mellitus with complication (HCC)  Mixed dyslipidemia  Other specified hypothyroidism  Other orders -     levothyroxine (SYNTHROID) 75 MCG tablet; Take 1 tablet (75 mcg total) by mouth daily.   Follow up: pending labs

## 2022-11-07 ENCOUNTER — Encounter: Payer: Self-pay | Admitting: Family Medicine

## 2022-11-07 ENCOUNTER — Ambulatory Visit (INDEPENDENT_AMBULATORY_CARE_PROVIDER_SITE_OTHER): Payer: Medicare HMO | Admitting: Family Medicine

## 2022-11-07 ENCOUNTER — Telehealth: Payer: Self-pay | Admitting: Medical

## 2022-11-07 VITALS — BP 102/60 | HR 124 | Temp 98.3°F | Resp 24 | Wt 161.0 lb

## 2022-11-07 DIAGNOSIS — N3000 Acute cystitis without hematuria: Secondary | ICD-10-CM | POA: Diagnosis not present

## 2022-11-07 LAB — CLOSTRIDIUM DIFFICILE TOXIN B, QUALITATIVE, REAL-TIME PCR: Toxigenic C. Difficile by PCR: NOT DETECTED

## 2022-11-07 MED ORDER — SULFAMETHOXAZOLE-TRIMETHOPRIM 800-160 MG PO TABS
1.0000 | ORAL_TABLET | Freq: Two times a day (BID) | ORAL | 0 refills | Status: DC
Start: 2022-11-07 — End: 2023-01-02

## 2022-11-07 NOTE — Progress Notes (Signed)
   Subjective:    Patient ID: Brandi Stephens, female    DOB: 11-Nov-1945, 77 y.o.   MRN: [id number]  HPI She states that yesterday she developed urinary frequency and urgency.  She did start using Azo Standard.  The symptoms have continued today.  No fever or chills.   Review of Systems     Objective:    Physical Exam Alert and in no distress.  An attempt was made to check her urine however it was too small to be able to check       Assessment & Plan:   Problem List Items Addressed This Visit     UTI (urinary tract infection) - Primary   Relevant Medications   sulfamethoxazole-trimethoprim (BACTRIM DS) 800-160 MG tablet   Other Relevant Orders   POCT URINALYSIS DIP (CLINITEK)  Clinically she does have a UTI and I will go ahead and treat her.  She will call if continued difficulty.

## 2022-11-11 ENCOUNTER — Telehealth: Payer: Self-pay | Admitting: Medical

## 2022-11-11 NOTE — Telephone Encounter (Signed)
Allie called to report no skill nurse visit. Patient is travelling today.

## 2022-11-21 DIAGNOSIS — D631 Anemia in chronic kidney disease: Secondary | ICD-10-CM | POA: Diagnosis not present

## 2022-11-21 DIAGNOSIS — G4700 Insomnia, unspecified: Secondary | ICD-10-CM | POA: Diagnosis not present

## 2022-11-21 DIAGNOSIS — K219 Gastro-esophageal reflux disease without esophagitis: Secondary | ICD-10-CM | POA: Diagnosis not present

## 2022-11-21 DIAGNOSIS — I951 Orthostatic hypotension: Secondary | ICD-10-CM | POA: Diagnosis not present

## 2022-11-21 DIAGNOSIS — N183 Chronic kidney disease, stage 3 unspecified: Secondary | ICD-10-CM | POA: Diagnosis not present

## 2022-11-21 DIAGNOSIS — E1151 Type 2 diabetes mellitus with diabetic peripheral angiopathy without gangrene: Secondary | ICD-10-CM | POA: Diagnosis not present

## 2022-11-21 DIAGNOSIS — F32A Depression, unspecified: Secondary | ICD-10-CM | POA: Diagnosis not present

## 2022-11-21 DIAGNOSIS — E1122 Type 2 diabetes mellitus with diabetic chronic kidney disease: Secondary | ICD-10-CM | POA: Diagnosis not present

## 2022-11-21 DIAGNOSIS — R1310 Dysphagia, unspecified: Secondary | ICD-10-CM | POA: Diagnosis not present

## 2022-11-21 DIAGNOSIS — E669 Obesity, unspecified: Secondary | ICD-10-CM | POA: Diagnosis not present

## 2022-11-21 DIAGNOSIS — M48061 Spinal stenosis, lumbar region without neurogenic claudication: Secondary | ICD-10-CM | POA: Diagnosis not present

## 2022-11-21 DIAGNOSIS — J449 Chronic obstructive pulmonary disease, unspecified: Secondary | ICD-10-CM | POA: Diagnosis not present

## 2022-11-21 DIAGNOSIS — E039 Hypothyroidism, unspecified: Secondary | ICD-10-CM | POA: Diagnosis not present

## 2022-11-21 DIAGNOSIS — M109 Gout, unspecified: Secondary | ICD-10-CM | POA: Diagnosis not present

## 2022-11-21 DIAGNOSIS — E1140 Type 2 diabetes mellitus with diabetic neuropathy, unspecified: Secondary | ICD-10-CM | POA: Diagnosis not present

## 2022-11-21 DIAGNOSIS — M4626 Osteomyelitis of vertebra, lumbar region: Secondary | ICD-10-CM | POA: Diagnosis not present

## 2022-11-21 DIAGNOSIS — G252 Other specified forms of tremor: Secondary | ICD-10-CM | POA: Diagnosis not present

## 2022-11-21 DIAGNOSIS — M170 Bilateral primary osteoarthritis of knee: Secondary | ICD-10-CM | POA: Diagnosis not present

## 2022-11-21 DIAGNOSIS — E785 Hyperlipidemia, unspecified: Secondary | ICD-10-CM | POA: Diagnosis not present

## 2022-11-21 DIAGNOSIS — G2581 Restless legs syndrome: Secondary | ICD-10-CM | POA: Diagnosis not present

## 2022-11-21 DIAGNOSIS — I872 Venous insufficiency (chronic) (peripheral): Secondary | ICD-10-CM | POA: Diagnosis not present

## 2022-11-21 DIAGNOSIS — K5790 Diverticulosis of intestine, part unspecified, without perforation or abscess without bleeding: Secondary | ICD-10-CM | POA: Diagnosis not present

## 2022-11-21 DIAGNOSIS — I130 Hypertensive heart and chronic kidney disease with heart failure and stage 1 through stage 4 chronic kidney disease, or unspecified chronic kidney disease: Secondary | ICD-10-CM | POA: Diagnosis not present

## 2022-11-21 DIAGNOSIS — F419 Anxiety disorder, unspecified: Secondary | ICD-10-CM | POA: Diagnosis not present

## 2022-11-21 DIAGNOSIS — I5032 Chronic diastolic (congestive) heart failure: Secondary | ICD-10-CM | POA: Diagnosis not present

## 2022-11-23 DIAGNOSIS — E1122 Type 2 diabetes mellitus with diabetic chronic kidney disease: Secondary | ICD-10-CM | POA: Diagnosis not present

## 2022-11-23 DIAGNOSIS — E785 Hyperlipidemia, unspecified: Secondary | ICD-10-CM | POA: Diagnosis not present

## 2022-11-23 DIAGNOSIS — F32A Depression, unspecified: Secondary | ICD-10-CM | POA: Diagnosis not present

## 2022-11-23 DIAGNOSIS — D631 Anemia in chronic kidney disease: Secondary | ICD-10-CM | POA: Diagnosis not present

## 2022-11-23 DIAGNOSIS — M170 Bilateral primary osteoarthritis of knee: Secondary | ICD-10-CM | POA: Diagnosis not present

## 2022-11-23 DIAGNOSIS — E1140 Type 2 diabetes mellitus with diabetic neuropathy, unspecified: Secondary | ICD-10-CM | POA: Diagnosis not present

## 2022-11-23 DIAGNOSIS — M109 Gout, unspecified: Secondary | ICD-10-CM | POA: Diagnosis not present

## 2022-11-23 DIAGNOSIS — I951 Orthostatic hypotension: Secondary | ICD-10-CM | POA: Diagnosis not present

## 2022-11-23 DIAGNOSIS — F419 Anxiety disorder, unspecified: Secondary | ICD-10-CM | POA: Diagnosis not present

## 2022-11-23 DIAGNOSIS — M48061 Spinal stenosis, lumbar region without neurogenic claudication: Secondary | ICD-10-CM | POA: Diagnosis not present

## 2022-11-23 DIAGNOSIS — I872 Venous insufficiency (chronic) (peripheral): Secondary | ICD-10-CM | POA: Diagnosis not present

## 2022-11-23 DIAGNOSIS — N183 Chronic kidney disease, stage 3 unspecified: Secondary | ICD-10-CM | POA: Diagnosis not present

## 2022-11-23 DIAGNOSIS — R1310 Dysphagia, unspecified: Secondary | ICD-10-CM | POA: Diagnosis not present

## 2022-11-23 DIAGNOSIS — G252 Other specified forms of tremor: Secondary | ICD-10-CM | POA: Diagnosis not present

## 2022-11-23 DIAGNOSIS — M4626 Osteomyelitis of vertebra, lumbar region: Secondary | ICD-10-CM | POA: Diagnosis not present

## 2022-11-23 DIAGNOSIS — I130 Hypertensive heart and chronic kidney disease with heart failure and stage 1 through stage 4 chronic kidney disease, or unspecified chronic kidney disease: Secondary | ICD-10-CM | POA: Diagnosis not present

## 2022-11-23 DIAGNOSIS — E039 Hypothyroidism, unspecified: Secondary | ICD-10-CM | POA: Diagnosis not present

## 2022-11-23 DIAGNOSIS — G4700 Insomnia, unspecified: Secondary | ICD-10-CM | POA: Diagnosis not present

## 2022-11-23 DIAGNOSIS — J449 Chronic obstructive pulmonary disease, unspecified: Secondary | ICD-10-CM | POA: Diagnosis not present

## 2022-11-23 DIAGNOSIS — K219 Gastro-esophageal reflux disease without esophagitis: Secondary | ICD-10-CM | POA: Diagnosis not present

## 2022-11-23 DIAGNOSIS — E669 Obesity, unspecified: Secondary | ICD-10-CM | POA: Diagnosis not present

## 2022-11-23 DIAGNOSIS — G2581 Restless legs syndrome: Secondary | ICD-10-CM | POA: Diagnosis not present

## 2022-11-23 DIAGNOSIS — K5790 Diverticulosis of intestine, part unspecified, without perforation or abscess without bleeding: Secondary | ICD-10-CM | POA: Diagnosis not present

## 2022-11-23 DIAGNOSIS — I5032 Chronic diastolic (congestive) heart failure: Secondary | ICD-10-CM | POA: Diagnosis not present

## 2022-11-23 DIAGNOSIS — E1151 Type 2 diabetes mellitus with diabetic peripheral angiopathy without gangrene: Secondary | ICD-10-CM | POA: Diagnosis not present

## 2022-11-30 ENCOUNTER — Encounter: Payer: Self-pay | Admitting: Gastroenterology

## 2022-12-06 DIAGNOSIS — M48061 Spinal stenosis, lumbar region without neurogenic claudication: Secondary | ICD-10-CM | POA: Diagnosis not present

## 2022-12-06 DIAGNOSIS — K219 Gastro-esophageal reflux disease without esophagitis: Secondary | ICD-10-CM | POA: Diagnosis not present

## 2022-12-06 DIAGNOSIS — G252 Other specified forms of tremor: Secondary | ICD-10-CM | POA: Diagnosis not present

## 2022-12-06 DIAGNOSIS — E669 Obesity, unspecified: Secondary | ICD-10-CM | POA: Diagnosis not present

## 2022-12-06 DIAGNOSIS — E039 Hypothyroidism, unspecified: Secondary | ICD-10-CM | POA: Diagnosis not present

## 2022-12-06 DIAGNOSIS — E1151 Type 2 diabetes mellitus with diabetic peripheral angiopathy without gangrene: Secondary | ICD-10-CM | POA: Diagnosis not present

## 2022-12-06 DIAGNOSIS — K5790 Diverticulosis of intestine, part unspecified, without perforation or abscess without bleeding: Secondary | ICD-10-CM | POA: Diagnosis not present

## 2022-12-06 DIAGNOSIS — G2581 Restless legs syndrome: Secondary | ICD-10-CM | POA: Diagnosis not present

## 2022-12-06 DIAGNOSIS — J449 Chronic obstructive pulmonary disease, unspecified: Secondary | ICD-10-CM | POA: Diagnosis not present

## 2022-12-06 DIAGNOSIS — I5032 Chronic diastolic (congestive) heart failure: Secondary | ICD-10-CM | POA: Diagnosis not present

## 2022-12-06 DIAGNOSIS — I130 Hypertensive heart and chronic kidney disease with heart failure and stage 1 through stage 4 chronic kidney disease, or unspecified chronic kidney disease: Secondary | ICD-10-CM | POA: Diagnosis not present

## 2022-12-06 DIAGNOSIS — E785 Hyperlipidemia, unspecified: Secondary | ICD-10-CM | POA: Diagnosis not present

## 2022-12-06 DIAGNOSIS — M4626 Osteomyelitis of vertebra, lumbar region: Secondary | ICD-10-CM | POA: Diagnosis not present

## 2022-12-06 DIAGNOSIS — I872 Venous insufficiency (chronic) (peripheral): Secondary | ICD-10-CM | POA: Diagnosis not present

## 2022-12-06 DIAGNOSIS — R1310 Dysphagia, unspecified: Secondary | ICD-10-CM | POA: Diagnosis not present

## 2022-12-06 DIAGNOSIS — G4700 Insomnia, unspecified: Secondary | ICD-10-CM | POA: Diagnosis not present

## 2022-12-06 DIAGNOSIS — M109 Gout, unspecified: Secondary | ICD-10-CM | POA: Diagnosis not present

## 2022-12-06 DIAGNOSIS — E1122 Type 2 diabetes mellitus with diabetic chronic kidney disease: Secondary | ICD-10-CM | POA: Diagnosis not present

## 2022-12-06 DIAGNOSIS — D631 Anemia in chronic kidney disease: Secondary | ICD-10-CM | POA: Diagnosis not present

## 2022-12-06 DIAGNOSIS — M170 Bilateral primary osteoarthritis of knee: Secondary | ICD-10-CM | POA: Diagnosis not present

## 2022-12-06 DIAGNOSIS — E1140 Type 2 diabetes mellitus with diabetic neuropathy, unspecified: Secondary | ICD-10-CM | POA: Diagnosis not present

## 2022-12-06 DIAGNOSIS — I951 Orthostatic hypotension: Secondary | ICD-10-CM | POA: Diagnosis not present

## 2022-12-06 DIAGNOSIS — F419 Anxiety disorder, unspecified: Secondary | ICD-10-CM | POA: Diagnosis not present

## 2022-12-06 DIAGNOSIS — N183 Chronic kidney disease, stage 3 unspecified: Secondary | ICD-10-CM | POA: Diagnosis not present

## 2022-12-06 DIAGNOSIS — F32A Depression, unspecified: Secondary | ICD-10-CM | POA: Diagnosis not present

## 2022-12-07 ENCOUNTER — Telehealth: Payer: Self-pay | Admitting: Medical

## 2022-12-07 DIAGNOSIS — D631 Anemia in chronic kidney disease: Secondary | ICD-10-CM | POA: Diagnosis not present

## 2022-12-07 DIAGNOSIS — K219 Gastro-esophageal reflux disease without esophagitis: Secondary | ICD-10-CM | POA: Diagnosis not present

## 2022-12-07 DIAGNOSIS — I872 Venous insufficiency (chronic) (peripheral): Secondary | ICD-10-CM | POA: Diagnosis not present

## 2022-12-07 DIAGNOSIS — I130 Hypertensive heart and chronic kidney disease with heart failure and stage 1 through stage 4 chronic kidney disease, or unspecified chronic kidney disease: Secondary | ICD-10-CM | POA: Diagnosis not present

## 2022-12-07 DIAGNOSIS — G4700 Insomnia, unspecified: Secondary | ICD-10-CM | POA: Diagnosis not present

## 2022-12-07 DIAGNOSIS — E1151 Type 2 diabetes mellitus with diabetic peripheral angiopathy without gangrene: Secondary | ICD-10-CM | POA: Diagnosis not present

## 2022-12-07 DIAGNOSIS — M4626 Osteomyelitis of vertebra, lumbar region: Secondary | ICD-10-CM | POA: Diagnosis not present

## 2022-12-07 DIAGNOSIS — R1310 Dysphagia, unspecified: Secondary | ICD-10-CM | POA: Diagnosis not present

## 2022-12-07 DIAGNOSIS — M170 Bilateral primary osteoarthritis of knee: Secondary | ICD-10-CM | POA: Diagnosis not present

## 2022-12-07 DIAGNOSIS — G252 Other specified forms of tremor: Secondary | ICD-10-CM | POA: Diagnosis not present

## 2022-12-07 DIAGNOSIS — E039 Hypothyroidism, unspecified: Secondary | ICD-10-CM | POA: Diagnosis not present

## 2022-12-07 DIAGNOSIS — J449 Chronic obstructive pulmonary disease, unspecified: Secondary | ICD-10-CM | POA: Diagnosis not present

## 2022-12-07 DIAGNOSIS — F419 Anxiety disorder, unspecified: Secondary | ICD-10-CM | POA: Diagnosis not present

## 2022-12-07 DIAGNOSIS — N183 Chronic kidney disease, stage 3 unspecified: Secondary | ICD-10-CM | POA: Diagnosis not present

## 2022-12-07 DIAGNOSIS — E1122 Type 2 diabetes mellitus with diabetic chronic kidney disease: Secondary | ICD-10-CM | POA: Diagnosis not present

## 2022-12-07 DIAGNOSIS — I5032 Chronic diastolic (congestive) heart failure: Secondary | ICD-10-CM | POA: Diagnosis not present

## 2022-12-07 DIAGNOSIS — M48061 Spinal stenosis, lumbar region without neurogenic claudication: Secondary | ICD-10-CM | POA: Diagnosis not present

## 2022-12-07 DIAGNOSIS — M109 Gout, unspecified: Secondary | ICD-10-CM | POA: Diagnosis not present

## 2022-12-07 DIAGNOSIS — E785 Hyperlipidemia, unspecified: Secondary | ICD-10-CM | POA: Diagnosis not present

## 2022-12-07 DIAGNOSIS — G2581 Restless legs syndrome: Secondary | ICD-10-CM | POA: Diagnosis not present

## 2022-12-07 DIAGNOSIS — I951 Orthostatic hypotension: Secondary | ICD-10-CM | POA: Diagnosis not present

## 2022-12-07 DIAGNOSIS — E1140 Type 2 diabetes mellitus with diabetic neuropathy, unspecified: Secondary | ICD-10-CM | POA: Diagnosis not present

## 2022-12-07 DIAGNOSIS — F32A Depression, unspecified: Secondary | ICD-10-CM | POA: Diagnosis not present

## 2022-12-07 DIAGNOSIS — E669 Obesity, unspecified: Secondary | ICD-10-CM | POA: Diagnosis not present

## 2022-12-07 DIAGNOSIS — K5790 Diverticulosis of intestine, part unspecified, without perforation or abscess without bleeding: Secondary | ICD-10-CM | POA: Diagnosis not present

## 2022-12-07 NOTE — Telephone Encounter (Signed)
Brandi Stephens with [facility] [phone]  Wants ok to continue OT 1 x week for 8 weeks  Also, she states she believes you are aware that patient has been sick She states that her lungs sound horrible, very diminished   Please advise

## 2022-12-07 NOTE — Telephone Encounter (Signed)
Left message for Brandi Stephens with [facility] about OT verbal orders  Pt is feeling better. Pt's stats have been around 97-99 and bp has been good. She is coming home from the beach and has an appt next week with [place]

## 2022-12-08 NOTE — Telephone Encounter (Signed)
Pt Assistance Ozempic received, pt informed, she has been sick from COVID, she is not coming in town, she will have daughter pick up

## 2022-12-09 ENCOUNTER — Telehealth: Payer: Self-pay | Admitting: Gastroenterology

## 2022-12-09 NOTE — Telephone Encounter (Signed)
Inbound call from patient stating that she needed to reschedule her colonoscopy on 8/26 at 2:00 due to being sick. Patient was rescheduled for 10/8 at 11:00 and is requesting a call to discuss the new times she needs to take her prep. Please advise.

## 2022-12-09 NOTE — Telephone Encounter (Signed)
Good Morning Dr. Adela Lank,  Patient called stating she needed to rescheduled her colonoscopy on 8/26 at 2:00 due to being sick with COVID.  Patient was rescheduled for 10/8 at 11:00

## 2022-12-12 ENCOUNTER — Encounter: Payer: Medicare HMO | Admitting: Gastroenterology

## 2022-12-12 NOTE — Telephone Encounter (Signed)
New instructions printed and mailed to patient and sent to MyChart.

## 2022-12-12 NOTE — Telephone Encounter (Signed)
Called and left message for pt

## 2022-12-12 NOTE — Telephone Encounter (Signed)
Okay thanks for letting me know, I understand.

## 2022-12-13 ENCOUNTER — Inpatient Hospital Stay: Payer: Medicare HMO | Admitting: Adult Health

## 2022-12-13 ENCOUNTER — Telehealth: Payer: Self-pay | Admitting: Adult Health

## 2022-12-13 NOTE — Telephone Encounter (Signed)
Rescheduled appointment per 8/27 staff message. Left voicemail with appointment details.

## 2022-12-14 ENCOUNTER — Other Ambulatory Visit: Payer: Medicare HMO

## 2022-12-15 ENCOUNTER — Ambulatory Visit: Payer: Medicare HMO | Admitting: Podiatry

## 2022-12-22 DIAGNOSIS — I872 Venous insufficiency (chronic) (peripheral): Secondary | ICD-10-CM | POA: Diagnosis not present

## 2022-12-22 DIAGNOSIS — E785 Hyperlipidemia, unspecified: Secondary | ICD-10-CM | POA: Diagnosis not present

## 2022-12-22 DIAGNOSIS — J449 Chronic obstructive pulmonary disease, unspecified: Secondary | ICD-10-CM | POA: Diagnosis not present

## 2022-12-22 DIAGNOSIS — K219 Gastro-esophageal reflux disease without esophagitis: Secondary | ICD-10-CM | POA: Diagnosis not present

## 2022-12-22 DIAGNOSIS — R1310 Dysphagia, unspecified: Secondary | ICD-10-CM | POA: Diagnosis not present

## 2022-12-22 DIAGNOSIS — F419 Anxiety disorder, unspecified: Secondary | ICD-10-CM | POA: Diagnosis not present

## 2022-12-22 DIAGNOSIS — E1151 Type 2 diabetes mellitus with diabetic peripheral angiopathy without gangrene: Secondary | ICD-10-CM | POA: Diagnosis not present

## 2022-12-22 DIAGNOSIS — G252 Other specified forms of tremor: Secondary | ICD-10-CM | POA: Diagnosis not present

## 2022-12-22 DIAGNOSIS — I951 Orthostatic hypotension: Secondary | ICD-10-CM | POA: Diagnosis not present

## 2022-12-22 DIAGNOSIS — G2581 Restless legs syndrome: Secondary | ICD-10-CM | POA: Diagnosis not present

## 2022-12-22 DIAGNOSIS — G4700 Insomnia, unspecified: Secondary | ICD-10-CM | POA: Diagnosis not present

## 2022-12-22 DIAGNOSIS — E039 Hypothyroidism, unspecified: Secondary | ICD-10-CM | POA: Diagnosis not present

## 2022-12-22 DIAGNOSIS — M4626 Osteomyelitis of vertebra, lumbar region: Secondary | ICD-10-CM | POA: Diagnosis not present

## 2022-12-22 DIAGNOSIS — K5790 Diverticulosis of intestine, part unspecified, without perforation or abscess without bleeding: Secondary | ICD-10-CM | POA: Diagnosis not present

## 2022-12-22 DIAGNOSIS — M109 Gout, unspecified: Secondary | ICD-10-CM | POA: Diagnosis not present

## 2022-12-22 DIAGNOSIS — I130 Hypertensive heart and chronic kidney disease with heart failure and stage 1 through stage 4 chronic kidney disease, or unspecified chronic kidney disease: Secondary | ICD-10-CM | POA: Diagnosis not present

## 2022-12-22 DIAGNOSIS — M48061 Spinal stenosis, lumbar region without neurogenic claudication: Secondary | ICD-10-CM | POA: Diagnosis not present

## 2022-12-22 DIAGNOSIS — E1122 Type 2 diabetes mellitus with diabetic chronic kidney disease: Secondary | ICD-10-CM | POA: Diagnosis not present

## 2022-12-22 DIAGNOSIS — E669 Obesity, unspecified: Secondary | ICD-10-CM | POA: Diagnosis not present

## 2022-12-22 DIAGNOSIS — D631 Anemia in chronic kidney disease: Secondary | ICD-10-CM | POA: Diagnosis not present

## 2022-12-22 DIAGNOSIS — E1140 Type 2 diabetes mellitus with diabetic neuropathy, unspecified: Secondary | ICD-10-CM | POA: Diagnosis not present

## 2022-12-22 DIAGNOSIS — N183 Chronic kidney disease, stage 3 unspecified: Secondary | ICD-10-CM | POA: Diagnosis not present

## 2022-12-22 DIAGNOSIS — M170 Bilateral primary osteoarthritis of knee: Secondary | ICD-10-CM | POA: Diagnosis not present

## 2022-12-22 DIAGNOSIS — F32A Depression, unspecified: Secondary | ICD-10-CM | POA: Diagnosis not present

## 2022-12-22 DIAGNOSIS — I5032 Chronic diastolic (congestive) heart failure: Secondary | ICD-10-CM | POA: Diagnosis not present

## 2022-12-23 DIAGNOSIS — R1310 Dysphagia, unspecified: Secondary | ICD-10-CM | POA: Diagnosis not present

## 2022-12-23 DIAGNOSIS — E1122 Type 2 diabetes mellitus with diabetic chronic kidney disease: Secondary | ICD-10-CM | POA: Diagnosis not present

## 2022-12-23 DIAGNOSIS — G2581 Restless legs syndrome: Secondary | ICD-10-CM | POA: Diagnosis not present

## 2022-12-23 DIAGNOSIS — N183 Chronic kidney disease, stage 3 unspecified: Secondary | ICD-10-CM | POA: Diagnosis not present

## 2022-12-23 DIAGNOSIS — I951 Orthostatic hypotension: Secondary | ICD-10-CM | POA: Diagnosis not present

## 2022-12-23 DIAGNOSIS — E039 Hypothyroidism, unspecified: Secondary | ICD-10-CM | POA: Diagnosis not present

## 2022-12-23 DIAGNOSIS — I5032 Chronic diastolic (congestive) heart failure: Secondary | ICD-10-CM | POA: Diagnosis not present

## 2022-12-23 DIAGNOSIS — K5790 Diverticulosis of intestine, part unspecified, without perforation or abscess without bleeding: Secondary | ICD-10-CM | POA: Diagnosis not present

## 2022-12-23 DIAGNOSIS — J449 Chronic obstructive pulmonary disease, unspecified: Secondary | ICD-10-CM | POA: Diagnosis not present

## 2022-12-23 DIAGNOSIS — M4626 Osteomyelitis of vertebra, lumbar region: Secondary | ICD-10-CM | POA: Diagnosis not present

## 2022-12-23 DIAGNOSIS — G4700 Insomnia, unspecified: Secondary | ICD-10-CM | POA: Diagnosis not present

## 2022-12-23 DIAGNOSIS — G252 Other specified forms of tremor: Secondary | ICD-10-CM | POA: Diagnosis not present

## 2022-12-23 DIAGNOSIS — E669 Obesity, unspecified: Secondary | ICD-10-CM | POA: Diagnosis not present

## 2022-12-23 DIAGNOSIS — M109 Gout, unspecified: Secondary | ICD-10-CM | POA: Diagnosis not present

## 2022-12-23 DIAGNOSIS — K219 Gastro-esophageal reflux disease without esophagitis: Secondary | ICD-10-CM | POA: Diagnosis not present

## 2022-12-23 DIAGNOSIS — I130 Hypertensive heart and chronic kidney disease with heart failure and stage 1 through stage 4 chronic kidney disease, or unspecified chronic kidney disease: Secondary | ICD-10-CM | POA: Diagnosis not present

## 2022-12-23 DIAGNOSIS — E785 Hyperlipidemia, unspecified: Secondary | ICD-10-CM | POA: Diagnosis not present

## 2022-12-23 DIAGNOSIS — M170 Bilateral primary osteoarthritis of knee: Secondary | ICD-10-CM | POA: Diagnosis not present

## 2022-12-23 DIAGNOSIS — F32A Depression, unspecified: Secondary | ICD-10-CM | POA: Diagnosis not present

## 2022-12-23 DIAGNOSIS — I872 Venous insufficiency (chronic) (peripheral): Secondary | ICD-10-CM | POA: Diagnosis not present

## 2022-12-23 DIAGNOSIS — D631 Anemia in chronic kidney disease: Secondary | ICD-10-CM | POA: Diagnosis not present

## 2022-12-23 DIAGNOSIS — F419 Anxiety disorder, unspecified: Secondary | ICD-10-CM | POA: Diagnosis not present

## 2022-12-23 DIAGNOSIS — E1140 Type 2 diabetes mellitus with diabetic neuropathy, unspecified: Secondary | ICD-10-CM | POA: Diagnosis not present

## 2022-12-23 DIAGNOSIS — E1151 Type 2 diabetes mellitus with diabetic peripheral angiopathy without gangrene: Secondary | ICD-10-CM | POA: Diagnosis not present

## 2022-12-23 DIAGNOSIS — M48061 Spinal stenosis, lumbar region without neurogenic claudication: Secondary | ICD-10-CM | POA: Diagnosis not present

## 2022-12-27 DIAGNOSIS — M170 Bilateral primary osteoarthritis of knee: Secondary | ICD-10-CM | POA: Diagnosis not present

## 2022-12-27 DIAGNOSIS — I5032 Chronic diastolic (congestive) heart failure: Secondary | ICD-10-CM | POA: Diagnosis not present

## 2022-12-27 DIAGNOSIS — E669 Obesity, unspecified: Secondary | ICD-10-CM | POA: Diagnosis not present

## 2022-12-27 DIAGNOSIS — E1151 Type 2 diabetes mellitus with diabetic peripheral angiopathy without gangrene: Secondary | ICD-10-CM | POA: Diagnosis not present

## 2022-12-27 DIAGNOSIS — K5790 Diverticulosis of intestine, part unspecified, without perforation or abscess without bleeding: Secondary | ICD-10-CM | POA: Diagnosis not present

## 2022-12-27 DIAGNOSIS — F419 Anxiety disorder, unspecified: Secondary | ICD-10-CM | POA: Diagnosis not present

## 2022-12-27 DIAGNOSIS — K219 Gastro-esophageal reflux disease without esophagitis: Secondary | ICD-10-CM | POA: Diagnosis not present

## 2022-12-27 DIAGNOSIS — M109 Gout, unspecified: Secondary | ICD-10-CM | POA: Diagnosis not present

## 2022-12-27 DIAGNOSIS — E039 Hypothyroidism, unspecified: Secondary | ICD-10-CM | POA: Diagnosis not present

## 2022-12-27 DIAGNOSIS — G2581 Restless legs syndrome: Secondary | ICD-10-CM | POA: Diagnosis not present

## 2022-12-27 DIAGNOSIS — G4700 Insomnia, unspecified: Secondary | ICD-10-CM | POA: Diagnosis not present

## 2022-12-27 DIAGNOSIS — E1140 Type 2 diabetes mellitus with diabetic neuropathy, unspecified: Secondary | ICD-10-CM | POA: Diagnosis not present

## 2022-12-27 DIAGNOSIS — M48061 Spinal stenosis, lumbar region without neurogenic claudication: Secondary | ICD-10-CM | POA: Diagnosis not present

## 2022-12-27 DIAGNOSIS — R1310 Dysphagia, unspecified: Secondary | ICD-10-CM | POA: Diagnosis not present

## 2022-12-27 DIAGNOSIS — G252 Other specified forms of tremor: Secondary | ICD-10-CM | POA: Diagnosis not present

## 2022-12-27 DIAGNOSIS — D631 Anemia in chronic kidney disease: Secondary | ICD-10-CM | POA: Diagnosis not present

## 2022-12-27 DIAGNOSIS — N183 Chronic kidney disease, stage 3 unspecified: Secondary | ICD-10-CM | POA: Diagnosis not present

## 2022-12-27 DIAGNOSIS — E1122 Type 2 diabetes mellitus with diabetic chronic kidney disease: Secondary | ICD-10-CM | POA: Diagnosis not present

## 2022-12-27 DIAGNOSIS — M4626 Osteomyelitis of vertebra, lumbar region: Secondary | ICD-10-CM | POA: Diagnosis not present

## 2022-12-27 DIAGNOSIS — J449 Chronic obstructive pulmonary disease, unspecified: Secondary | ICD-10-CM | POA: Diagnosis not present

## 2022-12-27 DIAGNOSIS — I130 Hypertensive heart and chronic kidney disease with heart failure and stage 1 through stage 4 chronic kidney disease, or unspecified chronic kidney disease: Secondary | ICD-10-CM | POA: Diagnosis not present

## 2022-12-27 DIAGNOSIS — E785 Hyperlipidemia, unspecified: Secondary | ICD-10-CM | POA: Diagnosis not present

## 2022-12-27 DIAGNOSIS — I872 Venous insufficiency (chronic) (peripheral): Secondary | ICD-10-CM | POA: Diagnosis not present

## 2022-12-27 DIAGNOSIS — I951 Orthostatic hypotension: Secondary | ICD-10-CM | POA: Diagnosis not present

## 2022-12-27 DIAGNOSIS — F32A Depression, unspecified: Secondary | ICD-10-CM | POA: Diagnosis not present

## 2022-12-29 DIAGNOSIS — I5032 Chronic diastolic (congestive) heart failure: Secondary | ICD-10-CM | POA: Diagnosis not present

## 2022-12-29 DIAGNOSIS — D631 Anemia in chronic kidney disease: Secondary | ICD-10-CM | POA: Diagnosis not present

## 2022-12-29 DIAGNOSIS — E039 Hypothyroidism, unspecified: Secondary | ICD-10-CM | POA: Diagnosis not present

## 2022-12-29 DIAGNOSIS — I872 Venous insufficiency (chronic) (peripheral): Secondary | ICD-10-CM | POA: Diagnosis not present

## 2022-12-29 DIAGNOSIS — G4700 Insomnia, unspecified: Secondary | ICD-10-CM | POA: Diagnosis not present

## 2022-12-29 DIAGNOSIS — R1310 Dysphagia, unspecified: Secondary | ICD-10-CM | POA: Diagnosis not present

## 2022-12-29 DIAGNOSIS — E1122 Type 2 diabetes mellitus with diabetic chronic kidney disease: Secondary | ICD-10-CM | POA: Diagnosis not present

## 2022-12-29 DIAGNOSIS — I130 Hypertensive heart and chronic kidney disease with heart failure and stage 1 through stage 4 chronic kidney disease, or unspecified chronic kidney disease: Secondary | ICD-10-CM | POA: Diagnosis not present

## 2022-12-29 DIAGNOSIS — K219 Gastro-esophageal reflux disease without esophagitis: Secondary | ICD-10-CM | POA: Diagnosis not present

## 2022-12-29 DIAGNOSIS — G252 Other specified forms of tremor: Secondary | ICD-10-CM | POA: Diagnosis not present

## 2022-12-29 DIAGNOSIS — E1151 Type 2 diabetes mellitus with diabetic peripheral angiopathy without gangrene: Secondary | ICD-10-CM | POA: Diagnosis not present

## 2022-12-29 DIAGNOSIS — E669 Obesity, unspecified: Secondary | ICD-10-CM | POA: Diagnosis not present

## 2022-12-29 DIAGNOSIS — G2581 Restless legs syndrome: Secondary | ICD-10-CM | POA: Diagnosis not present

## 2022-12-29 DIAGNOSIS — E785 Hyperlipidemia, unspecified: Secondary | ICD-10-CM | POA: Diagnosis not present

## 2022-12-29 DIAGNOSIS — M4626 Osteomyelitis of vertebra, lumbar region: Secondary | ICD-10-CM | POA: Diagnosis not present

## 2022-12-29 DIAGNOSIS — K5790 Diverticulosis of intestine, part unspecified, without perforation or abscess without bleeding: Secondary | ICD-10-CM | POA: Diagnosis not present

## 2022-12-29 DIAGNOSIS — F32A Depression, unspecified: Secondary | ICD-10-CM | POA: Diagnosis not present

## 2022-12-29 DIAGNOSIS — M170 Bilateral primary osteoarthritis of knee: Secondary | ICD-10-CM | POA: Diagnosis not present

## 2022-12-29 DIAGNOSIS — M48061 Spinal stenosis, lumbar region without neurogenic claudication: Secondary | ICD-10-CM | POA: Diagnosis not present

## 2022-12-29 DIAGNOSIS — N183 Chronic kidney disease, stage 3 unspecified: Secondary | ICD-10-CM | POA: Diagnosis not present

## 2022-12-29 DIAGNOSIS — M109 Gout, unspecified: Secondary | ICD-10-CM | POA: Diagnosis not present

## 2022-12-29 DIAGNOSIS — J449 Chronic obstructive pulmonary disease, unspecified: Secondary | ICD-10-CM | POA: Diagnosis not present

## 2022-12-29 DIAGNOSIS — F419 Anxiety disorder, unspecified: Secondary | ICD-10-CM | POA: Diagnosis not present

## 2022-12-29 DIAGNOSIS — I951 Orthostatic hypotension: Secondary | ICD-10-CM | POA: Diagnosis not present

## 2022-12-29 DIAGNOSIS — E1140 Type 2 diabetes mellitus with diabetic neuropathy, unspecified: Secondary | ICD-10-CM | POA: Diagnosis not present

## 2023-01-02 ENCOUNTER — Telehealth: Payer: Self-pay | Admitting: Gastroenterology

## 2023-01-02 ENCOUNTER — Other Ambulatory Visit: Payer: Self-pay

## 2023-01-02 ENCOUNTER — Other Ambulatory Visit: Payer: Self-pay | Admitting: Medical

## 2023-01-02 ENCOUNTER — Telehealth (INDEPENDENT_AMBULATORY_CARE_PROVIDER_SITE_OTHER): Payer: Medicare HMO | Admitting: Internal Medicine

## 2023-01-02 DIAGNOSIS — M4626 Osteomyelitis of vertebra, lumbar region: Secondary | ICD-10-CM

## 2023-01-02 DIAGNOSIS — T8463XA Infection and inflammatory reaction due to internal fixation device of spine, initial encounter: Secondary | ICD-10-CM | POA: Diagnosis not present

## 2023-01-02 MED ORDER — CEFADROXIL 500 MG PO CAPS
500.0000 mg | ORAL_CAPSULE | Freq: Two times a day (BID) | ORAL | 11 refills | Status: AC
Start: 2023-01-02 — End: ?

## 2023-01-02 NOTE — Progress Notes (Unsigned)
Virtual Visit via Video Note  I connected with Brandi Stephens on 01/02/23 at 11:00 AM EDT by a video enabled telemedicine application and verified that I am speaking with the correct person using two identifiers.  Location: Patient: at home in [facility] Provider: at clinic   I discussed the limitations of evaluation and management by telemedicine and the availability of in person appointments. The patient expressed understanding and agreed to proceed.  History of Present Illness:   hx of 4 back surgeries from L1-S1 fusion, but after her 3rd surgery - was having spasm from [facility] loosening and infection. She is on cefadroxil 500mg  po bid for chronic suppression.  Has had recent diagnosis with gastric paresis.  Since April Has noticed some loose stools. No abdominal cramping but "gasy" -- has an upcoming colonoscopy in oct  Observations/Objective: Gen = a xo by 3  Assessment and Plan: Chronic lumbar spine, HW infection = continue on cefadroxil 500mg  po bid Lumbar back pain = refer to primary care for management  Follow Up Instructions: HW infection = will plan to  give refills  for cefadroxil and we will see back in 4 months; October early - will do lab work (sed rate and crp)    I discussed the assessment and treatment plan with the patient. The patient was provided an opportunity to ask questions and all were answered. The patient agreed with the plan and demonstrated an understanding of the instructions.   The patient was advised to call back or seek an in-person evaluation if the symptoms worsen or if the condition fails to improve as anticipated.  I have personally spent 20 minutes involved in face-to-face and non-face-to-face activities for this patient on the day of the visit. Professional time spent includes the following activities: Preparing to see the patient (review of tests), Obtaining and/or reviewing separately obtained history (admission/discharge record), Performing  a medically appropriate examination and/or evaluation , Ordering medications/tests/procedures, referring and communicating with other health care professionals, Documenting clinical information in the EMR, Independently interpreting results (not separately reported), Communicating results to the patient/family/caregiver, Counseling and educating the patient/family/caregiver and Care coordination (not separately reported).     Judyann Munson, MD

## 2023-01-02 NOTE — Telephone Encounter (Signed)
Lm on vm for patient to return call

## 2023-01-02 NOTE — Telephone Encounter (Signed)
Patient called requesting to speak with a nurse regarding paracentesis and diarrhea symptoms.

## 2023-01-03 DIAGNOSIS — J449 Chronic obstructive pulmonary disease, unspecified: Secondary | ICD-10-CM | POA: Diagnosis not present

## 2023-01-03 DIAGNOSIS — E1140 Type 2 diabetes mellitus with diabetic neuropathy, unspecified: Secondary | ICD-10-CM | POA: Diagnosis not present

## 2023-01-03 DIAGNOSIS — M170 Bilateral primary osteoarthritis of knee: Secondary | ICD-10-CM | POA: Diagnosis not present

## 2023-01-03 DIAGNOSIS — E1122 Type 2 diabetes mellitus with diabetic chronic kidney disease: Secondary | ICD-10-CM | POA: Diagnosis not present

## 2023-01-03 DIAGNOSIS — F419 Anxiety disorder, unspecified: Secondary | ICD-10-CM | POA: Diagnosis not present

## 2023-01-03 DIAGNOSIS — E039 Hypothyroidism, unspecified: Secondary | ICD-10-CM | POA: Diagnosis not present

## 2023-01-03 DIAGNOSIS — N183 Chronic kidney disease, stage 3 unspecified: Secondary | ICD-10-CM | POA: Diagnosis not present

## 2023-01-03 DIAGNOSIS — M48061 Spinal stenosis, lumbar region without neurogenic claudication: Secondary | ICD-10-CM | POA: Diagnosis not present

## 2023-01-03 DIAGNOSIS — M4626 Osteomyelitis of vertebra, lumbar region: Secondary | ICD-10-CM | POA: Diagnosis not present

## 2023-01-03 DIAGNOSIS — M109 Gout, unspecified: Secondary | ICD-10-CM | POA: Diagnosis not present

## 2023-01-03 DIAGNOSIS — I5032 Chronic diastolic (congestive) heart failure: Secondary | ICD-10-CM | POA: Diagnosis not present

## 2023-01-03 DIAGNOSIS — F32A Depression, unspecified: Secondary | ICD-10-CM | POA: Diagnosis not present

## 2023-01-03 DIAGNOSIS — I130 Hypertensive heart and chronic kidney disease with heart failure and stage 1 through stage 4 chronic kidney disease, or unspecified chronic kidney disease: Secondary | ICD-10-CM | POA: Diagnosis not present

## 2023-01-03 DIAGNOSIS — K219 Gastro-esophageal reflux disease without esophagitis: Secondary | ICD-10-CM | POA: Diagnosis not present

## 2023-01-03 DIAGNOSIS — E1151 Type 2 diabetes mellitus with diabetic peripheral angiopathy without gangrene: Secondary | ICD-10-CM | POA: Diagnosis not present

## 2023-01-03 DIAGNOSIS — E669 Obesity, unspecified: Secondary | ICD-10-CM | POA: Diagnosis not present

## 2023-01-03 DIAGNOSIS — E785 Hyperlipidemia, unspecified: Secondary | ICD-10-CM | POA: Diagnosis not present

## 2023-01-03 DIAGNOSIS — G2581 Restless legs syndrome: Secondary | ICD-10-CM | POA: Diagnosis not present

## 2023-01-03 DIAGNOSIS — I872 Venous insufficiency (chronic) (peripheral): Secondary | ICD-10-CM | POA: Diagnosis not present

## 2023-01-03 DIAGNOSIS — R1310 Dysphagia, unspecified: Secondary | ICD-10-CM | POA: Diagnosis not present

## 2023-01-03 DIAGNOSIS — K5790 Diverticulosis of intestine, part unspecified, without perforation or abscess without bleeding: Secondary | ICD-10-CM | POA: Diagnosis not present

## 2023-01-03 DIAGNOSIS — G252 Other specified forms of tremor: Secondary | ICD-10-CM | POA: Diagnosis not present

## 2023-01-03 DIAGNOSIS — G4700 Insomnia, unspecified: Secondary | ICD-10-CM | POA: Diagnosis not present

## 2023-01-03 DIAGNOSIS — D631 Anemia in chronic kidney disease: Secondary | ICD-10-CM | POA: Diagnosis not present

## 2023-01-03 DIAGNOSIS — I951 Orthostatic hypotension: Secondary | ICD-10-CM | POA: Diagnosis not present

## 2023-01-04 ENCOUNTER — Telehealth: Payer: Self-pay | Admitting: Medical

## 2023-01-04 NOTE — Telephone Encounter (Signed)
Kim from [facility] called and is asking for verbal orders for discharge nursing and to increase OT to once a week. Provided call back number [phone].

## 2023-01-04 NOTE — Telephone Encounter (Signed)
Selena Batten was notified of verbal orders

## 2023-01-06 NOTE — Telephone Encounter (Signed)
No return call received from patient. Will await further communication from patient.

## 2023-01-11 DIAGNOSIS — M48061 Spinal stenosis, lumbar region without neurogenic claudication: Secondary | ICD-10-CM | POA: Diagnosis not present

## 2023-01-11 DIAGNOSIS — I5032 Chronic diastolic (congestive) heart failure: Secondary | ICD-10-CM | POA: Diagnosis not present

## 2023-01-11 DIAGNOSIS — M109 Gout, unspecified: Secondary | ICD-10-CM | POA: Diagnosis not present

## 2023-01-11 DIAGNOSIS — E1140 Type 2 diabetes mellitus with diabetic neuropathy, unspecified: Secondary | ICD-10-CM | POA: Diagnosis not present

## 2023-01-11 DIAGNOSIS — I872 Venous insufficiency (chronic) (peripheral): Secondary | ICD-10-CM | POA: Diagnosis not present

## 2023-01-11 DIAGNOSIS — M170 Bilateral primary osteoarthritis of knee: Secondary | ICD-10-CM | POA: Diagnosis not present

## 2023-01-11 DIAGNOSIS — I130 Hypertensive heart and chronic kidney disease with heart failure and stage 1 through stage 4 chronic kidney disease, or unspecified chronic kidney disease: Secondary | ICD-10-CM | POA: Diagnosis not present

## 2023-01-11 DIAGNOSIS — F419 Anxiety disorder, unspecified: Secondary | ICD-10-CM | POA: Diagnosis not present

## 2023-01-11 DIAGNOSIS — G4700 Insomnia, unspecified: Secondary | ICD-10-CM | POA: Diagnosis not present

## 2023-01-11 DIAGNOSIS — I951 Orthostatic hypotension: Secondary | ICD-10-CM | POA: Diagnosis not present

## 2023-01-11 DIAGNOSIS — R1310 Dysphagia, unspecified: Secondary | ICD-10-CM | POA: Diagnosis not present

## 2023-01-11 DIAGNOSIS — N183 Chronic kidney disease, stage 3 unspecified: Secondary | ICD-10-CM | POA: Diagnosis not present

## 2023-01-11 DIAGNOSIS — K5790 Diverticulosis of intestine, part unspecified, without perforation or abscess without bleeding: Secondary | ICD-10-CM | POA: Diagnosis not present

## 2023-01-11 DIAGNOSIS — G2581 Restless legs syndrome: Secondary | ICD-10-CM | POA: Diagnosis not present

## 2023-01-11 DIAGNOSIS — E1122 Type 2 diabetes mellitus with diabetic chronic kidney disease: Secondary | ICD-10-CM | POA: Diagnosis not present

## 2023-01-11 DIAGNOSIS — E785 Hyperlipidemia, unspecified: Secondary | ICD-10-CM | POA: Diagnosis not present

## 2023-01-11 DIAGNOSIS — E1151 Type 2 diabetes mellitus with diabetic peripheral angiopathy without gangrene: Secondary | ICD-10-CM | POA: Diagnosis not present

## 2023-01-11 DIAGNOSIS — J449 Chronic obstructive pulmonary disease, unspecified: Secondary | ICD-10-CM | POA: Diagnosis not present

## 2023-01-11 DIAGNOSIS — E039 Hypothyroidism, unspecified: Secondary | ICD-10-CM | POA: Diagnosis not present

## 2023-01-11 DIAGNOSIS — K219 Gastro-esophageal reflux disease without esophagitis: Secondary | ICD-10-CM | POA: Diagnosis not present

## 2023-01-11 DIAGNOSIS — G252 Other specified forms of tremor: Secondary | ICD-10-CM | POA: Diagnosis not present

## 2023-01-11 DIAGNOSIS — F32A Depression, unspecified: Secondary | ICD-10-CM | POA: Diagnosis not present

## 2023-01-11 DIAGNOSIS — E669 Obesity, unspecified: Secondary | ICD-10-CM | POA: Diagnosis not present

## 2023-01-11 DIAGNOSIS — M4626 Osteomyelitis of vertebra, lumbar region: Secondary | ICD-10-CM | POA: Diagnosis not present

## 2023-01-11 DIAGNOSIS — D631 Anemia in chronic kidney disease: Secondary | ICD-10-CM | POA: Diagnosis not present

## 2023-01-19 DIAGNOSIS — R1310 Dysphagia, unspecified: Secondary | ICD-10-CM | POA: Diagnosis not present

## 2023-01-19 DIAGNOSIS — G2581 Restless legs syndrome: Secondary | ICD-10-CM | POA: Diagnosis not present

## 2023-01-19 DIAGNOSIS — K5790 Diverticulosis of intestine, part unspecified, without perforation or abscess without bleeding: Secondary | ICD-10-CM | POA: Diagnosis not present

## 2023-01-19 DIAGNOSIS — F419 Anxiety disorder, unspecified: Secondary | ICD-10-CM | POA: Diagnosis not present

## 2023-01-19 DIAGNOSIS — N183 Chronic kidney disease, stage 3 unspecified: Secondary | ICD-10-CM | POA: Diagnosis not present

## 2023-01-19 DIAGNOSIS — G4700 Insomnia, unspecified: Secondary | ICD-10-CM | POA: Diagnosis not present

## 2023-01-19 DIAGNOSIS — I872 Venous insufficiency (chronic) (peripheral): Secondary | ICD-10-CM | POA: Diagnosis not present

## 2023-01-19 DIAGNOSIS — M48061 Spinal stenosis, lumbar region without neurogenic claudication: Secondary | ICD-10-CM | POA: Diagnosis not present

## 2023-01-19 DIAGNOSIS — I5032 Chronic diastolic (congestive) heart failure: Secondary | ICD-10-CM | POA: Diagnosis not present

## 2023-01-19 DIAGNOSIS — J449 Chronic obstructive pulmonary disease, unspecified: Secondary | ICD-10-CM | POA: Diagnosis not present

## 2023-01-19 DIAGNOSIS — E785 Hyperlipidemia, unspecified: Secondary | ICD-10-CM | POA: Diagnosis not present

## 2023-01-19 DIAGNOSIS — M4626 Osteomyelitis of vertebra, lumbar region: Secondary | ICD-10-CM | POA: Diagnosis not present

## 2023-01-19 DIAGNOSIS — E1122 Type 2 diabetes mellitus with diabetic chronic kidney disease: Secondary | ICD-10-CM | POA: Diagnosis not present

## 2023-01-19 DIAGNOSIS — I130 Hypertensive heart and chronic kidney disease with heart failure and stage 1 through stage 4 chronic kidney disease, or unspecified chronic kidney disease: Secondary | ICD-10-CM | POA: Diagnosis not present

## 2023-01-19 DIAGNOSIS — D631 Anemia in chronic kidney disease: Secondary | ICD-10-CM | POA: Diagnosis not present

## 2023-01-19 DIAGNOSIS — F32A Depression, unspecified: Secondary | ICD-10-CM | POA: Diagnosis not present

## 2023-01-19 DIAGNOSIS — G252 Other specified forms of tremor: Secondary | ICD-10-CM | POA: Diagnosis not present

## 2023-01-19 DIAGNOSIS — K219 Gastro-esophageal reflux disease without esophagitis: Secondary | ICD-10-CM | POA: Diagnosis not present

## 2023-01-19 DIAGNOSIS — E1151 Type 2 diabetes mellitus with diabetic peripheral angiopathy without gangrene: Secondary | ICD-10-CM | POA: Diagnosis not present

## 2023-01-19 DIAGNOSIS — M170 Bilateral primary osteoarthritis of knee: Secondary | ICD-10-CM | POA: Diagnosis not present

## 2023-01-19 DIAGNOSIS — E669 Obesity, unspecified: Secondary | ICD-10-CM | POA: Diagnosis not present

## 2023-01-19 DIAGNOSIS — E1140 Type 2 diabetes mellitus with diabetic neuropathy, unspecified: Secondary | ICD-10-CM | POA: Diagnosis not present

## 2023-01-19 DIAGNOSIS — I951 Orthostatic hypotension: Secondary | ICD-10-CM | POA: Diagnosis not present

## 2023-01-19 DIAGNOSIS — M109 Gout, unspecified: Secondary | ICD-10-CM | POA: Diagnosis not present

## 2023-01-19 DIAGNOSIS — E039 Hypothyroidism, unspecified: Secondary | ICD-10-CM | POA: Diagnosis not present

## 2023-01-23 ENCOUNTER — Telehealth: Payer: Self-pay | Admitting: Adult Health

## 2023-01-23 ENCOUNTER — Other Ambulatory Visit: Payer: Self-pay | Admitting: Medical

## 2023-01-23 DIAGNOSIS — Z1231 Encounter for screening mammogram for malignant neoplasm of breast: Secondary | ICD-10-CM

## 2023-01-23 NOTE — Telephone Encounter (Signed)
Patient is aware of scheduled visit being cancelled due to provider being out of office on 10/11; patient has stated that due to the new location of their living they will be unable to schedule anything now patient has also stated that they will call back after receiving their mammogram to schedule follow up visit

## 2023-01-24 ENCOUNTER — Ambulatory Visit: Payer: Medicare HMO | Admitting: Gastroenterology

## 2023-01-24 ENCOUNTER — Encounter: Payer: Self-pay | Admitting: Gastroenterology

## 2023-01-24 VITALS — BP 132/71 | HR 87 | Temp 96.0°F | Resp 15 | Ht 65.0 in | Wt 155.0 lb

## 2023-01-24 DIAGNOSIS — D123 Benign neoplasm of transverse colon: Secondary | ICD-10-CM | POA: Diagnosis not present

## 2023-01-24 DIAGNOSIS — Z09 Encounter for follow-up examination after completed treatment for conditions other than malignant neoplasm: Secondary | ICD-10-CM

## 2023-01-24 DIAGNOSIS — I10 Essential (primary) hypertension: Secondary | ICD-10-CM | POA: Diagnosis not present

## 2023-01-24 DIAGNOSIS — J449 Chronic obstructive pulmonary disease, unspecified: Secondary | ICD-10-CM | POA: Diagnosis not present

## 2023-01-24 DIAGNOSIS — Z860100 Personal history of colon polyps, unspecified: Secondary | ICD-10-CM

## 2023-01-24 DIAGNOSIS — K6289 Other specified diseases of anus and rectum: Secondary | ICD-10-CM

## 2023-01-24 DIAGNOSIS — Z860101 Personal history of adenomatous and serrated colon polyps: Secondary | ICD-10-CM | POA: Diagnosis not present

## 2023-01-24 DIAGNOSIS — K629 Disease of anus and rectum, unspecified: Secondary | ICD-10-CM | POA: Diagnosis not present

## 2023-01-24 DIAGNOSIS — E119 Type 2 diabetes mellitus without complications: Secondary | ICD-10-CM | POA: Diagnosis not present

## 2023-01-24 NOTE — Progress Notes (Signed)
Called to room to assist during endoscopic procedure.  Patient ID and intended procedure confirmed with present staff. Received instructions for my participation in the procedure from the performing physician.  

## 2023-01-24 NOTE — Progress Notes (Signed)
Pt's states no medical or surgical changes since previsit or office visit. 

## 2023-01-24 NOTE — Op Note (Signed)
[facility] Patient Name: Brandi Stephens Procedure Date: 01/24/2023 10:40 AM MRN: [id number] Endoscopist: Viviann Spare P. Adela Lank , MD, [id number] Age: 77 Referring MD:  Date of Birth: 11/25/1945 Gender: Female Account #: [id number] Procedure:                Colonoscopy Indications:              High risk colon cancer surveillance: Personal                            history of colonic polyps - multiple polyps removed                            12/2017 - most adenomas, also with history of AIN Medicines:                Monitored Anesthesia Care Procedure:                Pre-Anesthesia Assessment:                           - Prior to the procedure, a History and Physical                            was performed, and patient medications and                            allergies were reviewed. The patient's tolerance of                            previous anesthesia was also reviewed. The risks                            and benefits of the procedure and the sedation                            options and risks were discussed with the patient.                            All questions were answered, and informed consent                            was obtained. Prior Anticoagulants: The patient has                            taken no anticoagulant or antiplatelet agents. ASA                            Grade Assessment: III - A patient with severe                            systemic disease. After reviewing the risks and                            benefits, the patient was deemed in satisfactory  condition to undergo the procedure.                           After obtaining informed consent, the colonoscope                            was passed under direct vision. Throughout the                            procedure, the patient's blood pressure, pulse, and                            oxygen saturations were monitored continuously. The                             [id number]-HQ190L Colonoscope [id number] was introduced                            through the anus and advanced to the the cecum,                            identified by appendiceal orifice and ileocecal                            valve. The colonoscopy was performed without                            difficulty. The patient tolerated the procedure                            well. The quality of the bowel preparation was                            mostly adequate (as detailed below). The ileocecal                            valve, appendiceal orifice, and rectum were                            photographed. Scope In: 10:46:30 AM Scope Out: 11:12:59 AM Scope Withdrawal Time: 0 hours 17 minutes 54 seconds  Total Procedure Duration: 0 hours 26 minutes 29 seconds  Findings:                 Skin tags were found on perianal exam.                           There was evidence of a prior end-to-side                            ileo-colonic anastomosis in the ascending colon.                            This was patent and was characterized by healthy  appearing mucosa.                           A 3 mm polyp was found in the transverse colon. The                            polyp was sessile. The polyp was removed with a                            cold snare. Resection and retrieval were complete.                           Multiple small-mouthed diverticula were found in                            the transverse colon and left colon.                           Anal papilla(e) were hypertrophied. Biopsies were                            taken with a cold forceps for histology to rule out                            AIN.                           A large amount of semi-liquid stool with residual                            vegetable matter was found in the entire colon,                            making visualization difficult. Extensive lavage of                            the colon was  performed which prolonged the exam.                            Mostly adequate views were obtained throughout,                            however the rectum and rectosigmoid had the most                            burden of residual vegetable matter that was                            difficult to completely clear.                           The exam was otherwise without abnormality. Complications:            No immediate complications. Estimated blood loss:  Minimal. Estimated Blood Loss:     Estimated blood loss was minimal. Impression:               - Perianal skin tags found on perianal exam.                           - Patent end-to-side ileo-colonic anastomosis,                            characterized by healthy appearing mucosa.                           - One 3 mm polyp in the transverse colon, removed                            with a cold snare. Resected and retrieved.                           - Diverticulosis in the transverse colon and in the                            left colon.                           - Anal papilla(e) were hypertrophied. Biopsied.                           - Stool in the entire examined colon - extensive                            lavage performed which cleared most of the colon.                            Most affected was rectum / rectosigmoid which could                            not entirely be cleared but no obvious or large                            polyps noted..                           - The examination was otherwise normal. Recommendation:           - Patient has a contact number available for                            emergencies. The signs and symptoms of potential                            delayed complications were discussed with the                            patient. Return to normal activities tomorrow.  Written discharge instructions were provided to the                             patient.                           - Resume previous diet.                           - Continue present medications.                           - Await pathology results.                           - Consideration for flex sig at some point in the                            future to clear distal colon given retained stool                            in this area which was hard to clear. Viviann Spare P. Yamilett Anastos, MD 01/24/2023 11:21:30 AM This report has been signed electronically.

## 2023-01-24 NOTE — Progress Notes (Signed)
Brandi Stephens   Primary Care Physician:  Jac Canavan, PA-C   Reason for Procedure:   History of colon polyps  Plan:    colonoscopy     HPI: Brandi Stephens is a 77 y.o. female  here for colonoscopy surveillance - last exam 12/2017 - 11 polyps, most TAs, also had AIN and referred to surgery.   Patient denies any bowel symptoms at this time. No family history of colon cancer known. Otherwise feels well without any cardiopulmonary symptoms.   I have discussed risks / benefits of anesthesia and endoscopic procedure with Brandi Stephens and they wish to proceed with the exams as outlined today.    Past Medical History:  Diagnosis Date   AIN (anal intraepithelial neoplasia) anal canal    Anemia    with pregnancy   Anxiety    Arthritis    knees, lower back, knees   Chronic constipation    Chronic diastolic CHF (congestive heart failure) (HCC) 07/05/2019   CKD (chronic kidney disease)    Coarse tremors    Essential   COPD (chronic obstructive pulmonary disease) (HCC)    2017 chest CT, history of   Degenerative lumbar spinal stenosis    Depression    Depression 07/05/2019   Drug dependency (HCC)    Elevated PTHrP level 05/09/2019   EtOH use 07/05/2019   Pt denies heavy drinking however   Gastroparesis    GERD (gastroesophageal reflux disease)    pt denies   Gout    Grade I diastolic dysfunction 02/01/2016   Noted ECHO   Hemorrhoids    History of adenomatous polyp of colon    History of basal cell carcinoma (BCC) excision 2015   nose   History of sepsis 05/14/2014   post lumbar surgery   History of shingles    x2   Hyperlipidemia    Hypertension    Hypothyroidism    Irregular heart beat    history of while undergoing radiation   Malignant neoplasm of upper-inner quadrant of left breast in female, estrogen receptor positive [facility]) oncologist-  dr Darnelle Catalan--  per lov in epic no recurrence   dx 07-13-2015  Left breast invasive  ductal carcinoma, Stage IA, Grade 1 (TXN0),  09-16-2015  s/p  left breast lumpectomy with sln bx's,  completed radiation 11-18-2015,  started antiestrogen therapy 12-14-2015   Neuropathy    Left foot and back of left leg   Obesity (BMI 30-39.9) 07/05/2019   Personal history of radiation therapy    completed 11-18-2015  left breast   Pneumonia    PONV (postoperative nausea and vomiting)    Prediabetes    diet controlled, no med   Restless leg syndrome    Seasonal allergies    Seizure (HCC) 05/2015   due to sepsis, only one time episode   Tremor    Wears partial dentures    lower     Past Surgical History:  Procedure Laterality Date   ABDOMINAL HYSTERECTOMY  1985   BASAL CELL CARCINOMA EXCISION  10/15   nose   BIOPSY  07/22/2022   Procedure: BIOPSY;  Surgeon: Brandi Stephens;  Location: [facility];  Service: Gastroenterology;;   BREAST BIOPSY Right 08/24/2015   BREAST BIOPSY Right 07/13/2015   BREAST BIOPSY Left 07/13/2015   BREAST EXCISIONAL BIOPSY Left 1972   BREAST LUMPECTOMY Left    BREAST LUMPECTOMY WITH RADIOACTIVE SEED AND SENTINEL LYMPH NODE BIOPSY Left 09/16/2015   Procedure: BREAST  LUMPECTOMY WITH RADIOACTIVE SEED AND SENTINEL LYMPH NODE BIOPSY;  Surgeon: Brandi Lint, Stephens;  Location: [facility];  Service: General;  Laterality: Left;   BREAST REDUCTION SURGERY Bilateral 1998   CATARACT EXTRACTION W/ INTRAOCULAR LENS IMPLANT Left 2016   CERVICAL SPINE SURGERY  1995   hallo   COLONOSCOPY  01/2013   polyps   ESOPHAGOGASTRODUODENOSCOPY N/A 07/22/2022   Procedure: ESOPHAGOGASTRODUODENOSCOPY (EGD);  Surgeon: Brandi Stephens;  Location: [facility];  Service: Gastroenterology;  Laterality: N/A;   EYE SURGERY Left 2016   HAND SURGERY Left 02-19-2002   dr Merlyn Lot  @[facility]    repair collateral ligament/ MPJ left thumb   HARDWARE REMOVAL N/A 07/24/2019   Procedure: REVISION OF HARDWARE Lumbar two - Sacral one. Extention of Fusion to Lumbar one;  Surgeon:  Brandi Stephens;  Location: [facility] OR;  Service: Neurosurgery;  Laterality: N/A;   HEMORRHOID SURGERY  2008   HIGH RESOLUTION ANOSCOPY N/A 02/28/2018   Procedure: HIGH RESOLUTION ANOSCOPY WITH BIOPSY;  Surgeon: Brandi Levee, Stephens;  Location: [facility];  Service: General;  Laterality: N/A;   KNEE ARTHROSCOPY Right 2002   LUMBAR SPINE SURGERY  03-20-2015   dr Brandi Stephens   fusion L4-5, L5-S1   MOUTH SURGERY  07/14/2018   2 infected teeth removed   MULTIPLE TOOTH EXTRACTIONS     with bone grafting lower bottom right and left   REDUCTION MAMMAPLASTY Bilateral    RIGHT COLECTOMY  09-10-2003   dr Brandi Stephens @[facility]    multiple colon polyps (per path tubular adenoma's, hyperplastic , benign appendix, benign two lymph nodes   ROTATOR CUFF REPAIR Left 04/2016   TONSILLECTOMY  1969   TUBAL LIGATION Bilateral 1982    Prior to Admission medications   Medication Sig Start Date End Date Taking? Authorizing Provider  allopurinol (ZYLOPRIM) 300 MG tablet Take 300 mg by mouth daily.   Yes Provider, Historical, Stephens  Biotin 3 MG TABS Take 3 mg by mouth daily.   Yes Provider, Historical, Stephens  cefadroxil (DURICEF) 500 MG capsule Take 1 capsule (500 mg total) by mouth 2 (two) times daily. 01/02/23  Yes Brandi Munson, Stephens  Cholecalciferol (VITAMIN D3) 50 MCG (2000 UT) capsule Take 1 capsule (2,000 Units total) by mouth daily. 10/03/22  Yes Stephens, Brandi Balo, PA-C  DULoxetine (CYMBALTA) 60 MG capsule Take one daily 10/03/22  Yes Stephens, Brandi Balo, PA-C  levothyroxine (SYNTHROID) 75 MCG tablet Take 1 tablet (75 mcg total) by mouth daily. 11/04/22  Yes Stephens, Brandi Balo, PA-C  metoCLOPramide (REGLAN) 5 MG tablet Take 1 tablet (5 mg total) by mouth 3 (three) times daily before meals. 11/01/22  Yes Brandi Stephens  Propylene Glycol (SYSTANE BALANCE OP) Place 1 drop into both eyes 4 (four) times daily as needed (dry eyes).   Yes Provider, Historical, Stephens  rOPINIRole (REQUIP) 1 MG tablet TAKE 1 TABLET(1  MG) BY MOUTH AT BEDTIME 10/03/22  Yes Stephens, Brandi Balo, PA-C  topiramate (TOPAMAX) 25 MG tablet Take 3 tablets (75 mg total) by mouth at bedtime. 11/03/22  Yes Raulkar, Drema Pry, Stephens  colchicine 0.6 MG tablet Take 2 tablets at once then take 1 tablet an hour later then then 1 tablet twice daily x 3 days. 10/03/22   Stephens, Brandi Balo, PA-C  Continuous Glucose Sensor (FREESTYLE LIBRE 3 SENSOR) MISC PLACE 1 SENSOR ON SKIN EVERY 14 DAYS TO CHECK BLOOD GLUCOSE CONTINUOUSLY Patient not taking: Reported on 01/24/2023 10/03/22   Stephens, Brandi Balo, PA-C  diazepam (VALIUM) 10 MG tablet Take 1 tablet (10 mg total) by mouth daily as needed for anxiety. 11/03/22   Raulkar, Drema Pry, Stephens  Fluticasone-Umeclidin-Vilant ([name] ELLIPTA) 100-62.5-25 MCG/ACT AEPB Inhale 1 puff into the lungs daily. 10/03/22   Stephens, Brandi Balo, PA-C  levothyroxine (SYNTHROID) 88 MCG tablet TAKE 1 TABLET BY MOUTH EVERY DAY Patient not taking: Reported on 01/24/2023 01/03/23   Stephens, Brandi Balo, PA-C  pantoprazole (PROTONIX) 40 MG tablet Take 1 tablet (40 mg total) by mouth 2 (two) times daily. 07/23/22 11/01/22  Marinda Elk, Stephens  polyethylene glycol powder (GLYCOLAX/MIRALAX) 17 GM/SCOOP powder Take 17 g by mouth daily as needed for moderate constipation.    Provider, Historical, Stephens    Current Outpatient Medications  Medication Sig Dispense Refill   allopurinol (ZYLOPRIM) 300 MG tablet Take 300 mg by mouth daily.     Biotin 3 MG TABS Take 3 mg by mouth daily.     cefadroxil (DURICEF) 500 MG capsule Take 1 capsule (500 mg total) by mouth 2 (two) times daily. 60 capsule 11   Cholecalciferol (VITAMIN D3) 50 MCG (2000 UT) capsule Take 1 capsule (2,000 Units total) by mouth daily. 90 capsule 3   DULoxetine (CYMBALTA) 60 MG capsule Take one daily 90 capsule 0   levothyroxine (SYNTHROID) 75 MCG tablet Take 1 tablet (75 mcg total) by mouth daily. 30 tablet 2   metoCLOPramide (REGLAN) 5 MG tablet Take 1 tablet (5 mg total) by mouth 3 (three)  times daily before meals. 90 tablet 5   Propylene Glycol (SYSTANE BALANCE OP) Place 1 drop into both eyes 4 (four) times daily as needed (dry eyes).     rOPINIRole (REQUIP) 1 MG tablet TAKE 1 TABLET(1 MG) BY MOUTH AT BEDTIME 90 tablet 0   topiramate (TOPAMAX) 25 MG tablet Take 3 tablets (75 mg total) by mouth at bedtime. 270 tablet 3   colchicine 0.6 MG tablet Take 2 tablets at once then take 1 tablet an hour later then then 1 tablet twice daily x 3 days. 30 tablet 2   Continuous Glucose Sensor (FREESTYLE LIBRE 3 SENSOR) MISC PLACE 1 SENSOR ON SKIN EVERY 14 DAYS TO CHECK BLOOD GLUCOSE CONTINUOUSLY (Patient not taking: Reported on 01/24/2023) 2 each 3   diazepam (VALIUM) 10 MG tablet Take 1 tablet (10 mg total) by mouth daily as needed for anxiety. 90 tablet 3   Fluticasone-Umeclidin-Vilant ([name] ELLIPTA) 100-62.5-25 MCG/ACT AEPB Inhale 1 puff into the lungs daily. 90 each 3   levothyroxine (SYNTHROID) 88 MCG tablet TAKE 1 TABLET BY MOUTH EVERY DAY (Patient not taking: Reported on 01/24/2023) 30 tablet 5   pantoprazole (PROTONIX) 40 MG tablet Take 1 tablet (40 mg total) by mouth 2 (two) times daily. 60 tablet 1   polyethylene glycol powder (GLYCOLAX/MIRALAX) 17 GM/SCOOP powder Take 17 g by mouth daily as needed for moderate constipation.     No current facility-administered medications for this visit.    Allergies as of 01/24/2023 - Review Complete 01/24/2023  Allergen Reaction Noted   Ampicillin Hives and Other (See Comments) 09/30/2015   Gabapentin Swelling 02/12/2019   Penicillins Hives and Other (See Comments) 03/11/2011   Other Itching 02/26/2018    Family History  Problem Relation Age of Onset   Atrial fibrillation Mother    Ulcers Father    Breast cancer Maternal Aunt    Lung cancer Maternal Uncle    Stomach cancer Maternal Grandmother    Lung cancer Maternal Aunt    Brain cancer  Maternal Aunt    Prostate cancer Maternal Grandfather    Stroke Paternal Grandmother     Tuberculosis Paternal Grandfather    Colon cancer Neg Hx    Esophageal cancer Neg Hx    Rectal cancer Neg Hx     Social History   Socioeconomic History   Marital status: Married    Spouse name: robert   Number of children: 3   Years of education: college   Highest education level: Not on file  Occupational History   Occupation: retired  Tobacco Use   Smoking status: Former    Current packs/day: 0.00    Average packs/day: 0.5 packs/day for 35.0 years (17.5 ttl pk-yrs)    Types: Cigarettes    Start date: 05/23/1983    Quit date: 05/22/2018    Years since quitting: 4.6   Smokeless tobacco: Never  Vaping Use   Vaping status: Never Used  Substance and Sexual Activity   Alcohol use: Yes    Comment: 1 glass of wine a night   Drug use: Not Currently    Comment: CBD tincture   Sexual activity: Yes    Birth control/protection: Post-menopausal  Other Topics Concern   Not on file  Social History Narrative   Lives with husband in a 2 story home.  Has 2 living children.  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  Education: college.    Right-handed.   Five cups coffee per week.   Social Determinants of Health   Financial Resource Strain: Low Risk  (03/18/2022)   Overall Financial Resource Strain (CARDIA)    Difficulty of Paying Living Expenses: Not hard at all  Food Insecurity: No Food Insecurity (07/18/2022)   Hunger Vital Sign    Worried About Running Out of Food in the Last Year: Never true    Ran Out of Food in the Last Year: Never true  Transportation Needs: No Transportation Needs (07/18/2022)   PRAPARE - Administrator, Civil Service (Medical): No    Lack of Transportation (Non-Medical): No  Stephens Activity: Inactive (03/18/2022)   Exercise Vital Sign    Days of Exercise per Week: 0 days    Minutes of Exercise per Session: 0 min  Stress: No Stress Concern Present (03/18/2022)   [employer] of Occupational Health - Occupational Stress Questionnaire     Feeling of Stress : Only a little  Social Connections: Not on file  Intimate Partner Violence: Not At Risk (07/18/2022)   Humiliation, Afraid, Rape, and Kick questionnaire    Fear of Current or Ex-Partner: No    Emotionally Abused: No    Physically Abused: No    Sexually Abused: No    Review of Systems: All other review of systems negative except as mentioned in the HPI.  Stephens Exam: Vital signs BP 116/67   Pulse (!) 104   Temp (!) 96 F (35.6 C)   Ht 5\' 5"  (1.651 m)   Wt 155 lb (70.3 kg)   SpO2 97%   BMI 25.79 kg/m   General:   Alert,  Well-developed, pleasant and cooperative in NAD Lungs:  Clear throughout to auscultation.   Heart:  Regular rate and rhythm Abdomen:  Soft, nontender and nondistended.   Neuro/Psych:  Alert and cooperative. Normal mood and affect. A and O x 3  Harlin Rain, Stephens [facility]

## 2023-01-24 NOTE — Progress Notes (Signed)
Sedate, gd SR, tolerated procedure well, VSS, report to RN 

## 2023-01-24 NOTE — Patient Instructions (Signed)
Please read handouts provided. Continue present medications. Await pathology results.   YOU HAD AN ENDOSCOPIC PROCEDURE TODAY AT THE [facility]:   Refer to the procedure report that was given to you for any specific questions about what was found during the examination.  If the procedure report does not answer your questions, please call your gastroenterologist to clarify.  If you requested that your care partner not be given the details of your procedure findings, then the procedure report has been included in a sealed envelope for you to review at your convenience later.  YOU SHOULD EXPECT: Some feelings of bloating in the abdomen. Passage of more gas than usual.  Walking can help get rid of the air that was put into your GI tract during the procedure and reduce the bloating. If you had a lower endoscopy (such as a colonoscopy or flexible sigmoidoscopy) you may notice spotting of blood in your stool or on the toilet paper. If you underwent a bowel prep for your procedure, you may not have a normal bowel movement for a few days.  Please Note:  You might notice some irritation and congestion in your nose or some drainage.  This is from the oxygen used during your procedure.  There is no need for concern and it should clear up in a day or so.  SYMPTOMS TO REPORT IMMEDIATELY:  Following lower endoscopy (colonoscopy or flexible sigmoidoscopy):  Excessive amounts of blood in the stool  Significant tenderness or worsening of abdominal pains  Swelling of the abdomen that is new, acute  Fever of 100F or higher  For urgent or emergent issues, a gastroenterologist can be reached at any hour by calling [phone]. Do not use MyChart messaging for urgent concerns.    DIET:  We do recommend a small meal at first, but then you may proceed to your regular diet.  Drink plenty of fluids but you should avoid alcoholic beverages for 24 hours.  ACTIVITY:  You should plan to take it easy for  the rest of today and you should NOT DRIVE or use heavy machinery until tomorrow (because of the sedation medicines used during the test).    FOLLOW UP: Our staff will call the number listed on your records the next business day following your procedure.  We will call around 7:15- 8:00 am to check on you and address any questions or concerns that you may have regarding the information given to you following your procedure. If we do not reach you, we will leave a message.     If any biopsies were taken you will be contacted by phone or by letter within the next 1-3 weeks.  Please call us at [phone] if you have not heard about the biopsies in 3 weeks.    SIGNATURES/CONFIDENTIALITY: You and/or your care partner have signed paperwork which will be entered into your electronic medical record.  These signatures attest to the fact that that the information above on your After Visit Summary has been reviewed and is understood.  Full responsibility of the confidentiality of this discharge information lies with you and/or your care-partner. 

## 2023-01-25 ENCOUNTER — Telehealth: Payer: Self-pay

## 2023-01-25 NOTE — Telephone Encounter (Signed)
Left message on answering machine. 

## 2023-01-26 ENCOUNTER — Encounter: Payer: Self-pay | Admitting: Podiatry

## 2023-01-26 ENCOUNTER — Ambulatory Visit: Payer: Medicare HMO | Admitting: Podiatry

## 2023-01-26 ENCOUNTER — Other Ambulatory Visit: Payer: Self-pay

## 2023-01-26 ENCOUNTER — Other Ambulatory Visit: Payer: Medicare HMO

## 2023-01-26 VITALS — Ht 65.0 in | Wt 155.0 lb

## 2023-01-26 DIAGNOSIS — E118 Type 2 diabetes mellitus with unspecified complications: Secondary | ICD-10-CM | POA: Diagnosis not present

## 2023-01-26 DIAGNOSIS — M5416 Radiculopathy, lumbar region: Secondary | ICD-10-CM | POA: Diagnosis not present

## 2023-01-26 DIAGNOSIS — G629 Polyneuropathy, unspecified: Secondary | ICD-10-CM | POA: Diagnosis not present

## 2023-01-26 DIAGNOSIS — M4626 Osteomyelitis of vertebra, lumbar region: Secondary | ICD-10-CM

## 2023-01-26 MED ORDER — PREGABALIN 50 MG PO CAPS: 50.0000 mg | ORAL_CAPSULE | Freq: Two times a day (BID) | ORAL | 2 refills | Status: DC

## 2023-01-26 NOTE — Progress Notes (Signed)
Subjective:  Patient ID: Brandi Stephens, female    DOB: Jun 15, 1945,  MRN: [id number]  Chief Complaint  Patient presents with   Toe Pain    Rm15: bilateral foot pain, neuropathy, and tingling, pain with walking on tips of toes    77 y.o. female presents with concern for bilateral foot pain especially in the plantar foot.  Patient does have a history of neuropathy and has had multiple back surgeries.  She has pain with walking on the tips of her toes as well as tingling burning sensation she has previously taken gabapentin in the past for this but had some swelling associated with that medication was not able to take it.  She also takes Cymbalta which helps with the neuropathy in her upper extremities.  Has not seen a neurologist for this problem.  Does go to a pain specialist.  Past Medical History:  Diagnosis Date   AIN (anal intraepithelial neoplasia) anal canal    Anemia    with pregnancy   Anxiety    Arthritis    knees, lower back, knees   Chronic constipation    Chronic diastolic CHF (congestive heart failure) (HCC) 07/05/2019   CKD (chronic kidney disease)    Coarse tremors    Essential   COPD (chronic obstructive pulmonary disease) (HCC)    2017 chest CT, history of   Degenerative lumbar spinal stenosis    Depression    Depression 07/05/2019   Drug dependency (HCC)    Elevated PTHrP level 05/09/2019   EtOH use 07/05/2019   Pt denies heavy drinking however   Gastroparesis    GERD (gastroesophageal reflux disease)    pt denies   Gout    Grade I diastolic dysfunction 02/01/2016   Noted ECHO   Hemorrhoids    History of adenomatous polyp of colon    History of basal cell carcinoma (BCC) excision 2015   nose   History of sepsis 05/14/2014   post lumbar surgery   History of shingles    x2   Hyperlipidemia    Hypertension    Hypothyroidism    Irregular heart beat    history of while undergoing radiation   Malignant neoplasm of upper-inner quadrant of left breast  in female, estrogen receptor positive [facility]) oncologist-  dr Darnelle Catalan--  per lov in epic no recurrence   dx 07-13-2015  Left breast invasive ductal carcinoma, Stage IA, Grade 1 (TXN0),  09-16-2015  s/p  left breast lumpectomy with sln bx's,  completed radiation 11-18-2015,  started antiestrogen therapy 12-14-2015   Neuropathy    Left foot and back of left leg   Obesity (BMI 30-39.9) 07/05/2019   Personal history of radiation therapy    completed 11-18-2015  left breast   Pneumonia    PONV (postoperative nausea and vomiting)    Prediabetes    diet controlled, no med   Restless leg syndrome    Seasonal allergies    Seizure (HCC) 05/2015   due to sepsis, only one time episode   Tremor    Wears partial dentures    lower     Allergies  Allergen Reactions   Ampicillin Hives and Other (See Comments)    Severe reaction in February 2017 1.5 month to have hives to go away   Gabapentin Swelling    UNSPECIFIED REACTION    Penicillins Hives and Other (See Comments)    Severe reaction in February 2017 1.5 month to have hives to go away Has patient had a PCN  reaction causing immediate rash, facial/tongue/throat swelling, SOB or lightheadedness with hypotension: #  #  #  YES  #  #  #  Has patient had a PCN reaction causing severe rash involving mucus membranes or skin necrosis: No Has patient had a PCN reaction that required hospitalization No Has patient had a PCN reaction occurring within the last 10 years: No    Other Itching    UNSPECIFIED Analgesics    ROS: Negative except as per HPI above  Objective:  General: AAO x3, NAD  Dermatological: Fungal dystrophy of the nails with thickening and subungual debris discoloration and dystrophy.  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  Vascular:  Dorsalis Pedis artery and Posterior Tibial artery pedal pulses are 2/4 bilateral.  Capillary fill time < 3 sec to all digits.   Neruologic: Grossly  diminished via light touch to the level of the digits and hypersensitive to light sensation on the plantar foot bilaterally  Musculoskeletal: Slight plantarflexion deformity of the lesser digits on the left foot however no evidence of dropfoot or diminished extensor function  Gait: Assisted with walker  No images are attached to the encounter.  Radiographs:  Deferred Assessment:   1. Neuropathy   2. Lumbar radiculopathy   3. Type II diabetes mellitus with complication [facility])      Plan:  Patient was evaluated and treated and all questions answered.  # Peripheral neuropathy likely secondary to lumbar radiculopathy/prior back surgeries -Discussed treatment options with the patient in detail.  Patient's previously tried gabapentin as well as Qutenza patches neither of these have worked for her or she has had side effects. -Did discuss Lyrica as an option for her to try said she could have the same effect of swelling in the bilateral lower extremity with this however since she has not tried it we will proceed with low-dose 50 mg twice daily for neuropathic pain discussed the risks and side effects including risk of drowsiness and swelling. -Will send referral to neurology clinic as she currently does not have a neurologist and I believe she would benefit from further evaluation terms of medical management of her neuropathic pain.  Patient does not want any further surgery -Discussed possible nerve stimulators as an option to explore as well this would be discussed with her pain specialist or neurosurgeon.  Return in about 3 months (around 04/28/2023).          Corinna Gab, DPM [facility] / [facility]

## 2023-01-27 ENCOUNTER — Encounter: Payer: Medicare HMO | Admitting: Adult Health

## 2023-01-27 ENCOUNTER — Ambulatory Visit
Admission: RE | Admit: 2023-01-27 | Discharge: 2023-01-27 | Disposition: A | Discharge status: Home / Self Care | Payer: Medicare HMO | Source: Ambulatory Visit | Attending: Medical | Admitting: Medical

## 2023-01-27 DIAGNOSIS — Z1231 Encounter for screening mammogram for malignant neoplasm of breast: Secondary | ICD-10-CM

## 2023-01-27 DIAGNOSIS — H2511 Age-related nuclear cataract, right eye: Secondary | ICD-10-CM | POA: Diagnosis not present

## 2023-01-27 DIAGNOSIS — H04123 Dry eye syndrome of bilateral lacrimal glands: Secondary | ICD-10-CM | POA: Diagnosis not present

## 2023-01-27 DIAGNOSIS — R7303 Prediabetes: Secondary | ICD-10-CM | POA: Diagnosis not present

## 2023-01-27 LAB — HM DIABETES EYE EXAM

## 2023-01-31 NOTE — Progress Notes (Signed)
Results sent through MyChart

## 2023-02-01 LAB — SURGICAL PATHOLOGY

## 2023-02-06 ENCOUNTER — Encounter: Payer: Medicare HMO | Admitting: Adult Health

## 2023-02-08 ENCOUNTER — Encounter: Payer: Medicare HMO | Admitting: Medical

## 2023-02-13 ENCOUNTER — Ambulatory Visit (INDEPENDENT_AMBULATORY_CARE_PROVIDER_SITE_OTHER): Payer: Medicare HMO | Admitting: Medical

## 2023-02-13 ENCOUNTER — Encounter: Payer: Self-pay | Admitting: Medical

## 2023-02-13 ENCOUNTER — Telehealth: Payer: Self-pay

## 2023-02-13 VITALS — BP 110/70 | HR 71 | Wt 167.6 lb

## 2023-02-13 DIAGNOSIS — G894 Chronic pain syndrome: Secondary | ICD-10-CM | POA: Diagnosis not present

## 2023-02-13 DIAGNOSIS — I10 Essential (primary) hypertension: Secondary | ICD-10-CM | POA: Diagnosis not present

## 2023-02-13 DIAGNOSIS — T466X5A Adverse effect of antihyperlipidemic and antiarteriosclerotic drugs, initial encounter: Secondary | ICD-10-CM | POA: Insufficient documentation

## 2023-02-13 DIAGNOSIS — M5416 Radiculopathy, lumbar region: Secondary | ICD-10-CM

## 2023-02-13 DIAGNOSIS — K3184 Gastroparesis: Secondary | ICD-10-CM | POA: Diagnosis not present

## 2023-02-13 DIAGNOSIS — M2550 Pain in unspecified joint: Secondary | ICD-10-CM

## 2023-02-13 DIAGNOSIS — E782 Mixed hyperlipidemia: Secondary | ICD-10-CM | POA: Diagnosis not present

## 2023-02-13 DIAGNOSIS — T50905S Adverse effect of unspecified drugs, medicaments and biological substances, sequela: Secondary | ICD-10-CM | POA: Insufficient documentation

## 2023-02-13 DIAGNOSIS — E038 Other specified hypothyroidism: Secondary | ICD-10-CM

## 2023-02-13 DIAGNOSIS — M791 Myalgia, unspecified site: Secondary | ICD-10-CM | POA: Diagnosis not present

## 2023-02-13 DIAGNOSIS — I700 Atherosclerosis of aorta: Secondary | ICD-10-CM

## 2023-02-13 DIAGNOSIS — E119 Type 2 diabetes mellitus without complications: Secondary | ICD-10-CM | POA: Diagnosis not present

## 2023-02-13 DIAGNOSIS — J431 Panlobular emphysema: Secondary | ICD-10-CM

## 2023-02-13 NOTE — Telephone Encounter (Signed)
Patient missed lab appointment 10/10 due to hurting her back. She is going to [employer], PA's office today for labs and will have ESR and CRP done there.   Sandie Ano, RN

## 2023-02-13 NOTE — Progress Notes (Signed)
Subjective:  Brandi Stephens is a 77 y.o. female who presents for Chief Complaint  Patient presents with   Medical Management of Chronic Issues    Med check, pain coming into hip from back and will create a shooting pain and she will start crying out as the pain is severe      Patient Care Team: Oda Lansdowne, Cleda Mccreedy as PCP - General (Family Medicine) Rennis Golden Lisette Abu, MD as PCP - Cardiology (Cardiology) Magrinat, Valentino Hue, MD as Consulting Physician (Oncology) Almond Lint, MD as Consulting Physician (General Surgery) Ronnald Nian, MD as Consulting Physician (Family Medicine) [name], MD as Consulting Physician (Neurosurgery) Mathews Robinsons, MD as Consulting Physician (Dermatology) Axel Filler, Larna Daughters, NP as Nurse Practitioner (Hematology and Oncology) Teryl Lucy, MD as Consulting Physician (Orthopedic Surgery) Odette Fraction, MD (Inactive) as Consulting Physician (Anesthesiology) Armbruster, Willaim Rayas, MD as Consulting Physician (Gastroenterology) [name], MD as Consulting Physician (Neurosurgery) Levert Feinstein, MD as Consulting Physician (Neurology) Verner Chol, [facility] as Pharmacist (Pharmacist)    Here for med check.  Accompanied by her daughter Carollee Herter.  Last visit we modified her thyroid dose after seeing her thyroid labs.  She is doing okay on 75 mcg daily.  Here for updated labs  After stopping the midodrine last time, she has remained with good blood pressure readings and doing okay.  No recent problems with lightheadedness or dizziness or very low blood pressures  COPD history-no recent inhaler use, not on oxygen currently.  Pulse ox readings at home ranging 95% or higher but typically about 97%.  She needs an updated referral for occupational therapy for back down at [facility] where she lives part time.   Been in town visiting, going back to [facility] tomorrow  Not currently checking glucose at home.  They were staying  stable so she quit taking her glucose readings.  Using Reglan  prn and not daily now  She has neuropathy, sees pain specialist as she has been having a lot of low back pain as well.  She saw podiatrist recently and they recommended she start Lyrica but she is really concerned about this.  She had prior allergy to gabapentin.  She is already on a lot of medications.  She is on Topamax per pain specialist.  She has not started the Lyrica  Lately having pains in her right knee, shoulders, low back, multiple joints hurt.  No other aggravating or relieving factors.    No other c/o.  Past Medical History:  Diagnosis Date   AIN (anal intraepithelial neoplasia) anal canal    Anemia    with pregnancy   Anxiety    Arthritis    knees, lower back, knees   Chronic constipation    Chronic diastolic CHF (congestive heart failure) (HCC) 07/05/2019   CKD (chronic kidney disease)    Coarse tremors    Essential   COPD (chronic obstructive pulmonary disease) (HCC)    2017 chest CT, history of   Degenerative lumbar spinal stenosis    Depression    Depression 07/05/2019   Drug dependency (HCC)    Elevated PTHrP level 05/09/2019   EtOH use 07/05/2019   Pt denies heavy drinking however   Gastroparesis    GERD (gastroesophageal reflux disease)    pt denies   Gout    Grade I diastolic dysfunction 02/01/2016   Noted ECHO   Hemorrhoids    History of adenomatous polyp of colon    History of basal cell carcinoma (  BCC) excision 2015   nose   History of sepsis 05/14/2014   post lumbar surgery   History of shingles    x2   Hyperlipidemia    Hypertension    Hypothyroidism    Irregular heart beat    history of while undergoing radiation   Malignant neoplasm of upper-inner quadrant of left breast in female, estrogen receptor positive [facility]) oncologist-  dr Darnelle Catalan--  per lov in epic no recurrence   dx 07-13-2015  Left breast invasive ductal carcinoma, Stage IA, Grade 1 (TXN0),  09-16-2015  s/p   left breast lumpectomy with sln bx's,  completed radiation 11-18-2015,  started antiestrogen therapy 12-14-2015   Neuropathy    Left foot and back of left leg   Obesity (BMI 30-39.9) 07/05/2019   Personal history of radiation therapy    completed 11-18-2015  left breast   Pneumonia    PONV (postoperative nausea and vomiting)    Prediabetes    diet controlled, no med   Restless leg syndrome    Seasonal allergies    Seizure (HCC) 05/2015   due to sepsis, only one time episode   Tremor    Wears partial dentures    lower    Current Outpatient Medications on File Prior to Visit  Medication Sig Dispense Refill   allopurinol (ZYLOPRIM) 300 MG tablet Take 300 mg by mouth daily.     Biotin 3 MG TABS Take 3 mg by mouth daily.     cefadroxil (DURICEF) 500 MG capsule Take 1 capsule (500 mg total) by mouth 2 (two) times daily. 60 capsule 11   Cholecalciferol (VITAMIN D3) 50 MCG (2000 UT) capsule Take 1 capsule (2,000 Units total) by mouth daily. 90 capsule 3   colchicine 0.6 MG tablet Take 2 tablets at once then take 1 tablet an hour later then then 1 tablet twice daily x 3 days. 30 tablet 2   Continuous Glucose Sensor (FREESTYLE LIBRE 3 SENSOR) MISC PLACE 1 SENSOR ON SKIN EVERY 14 DAYS TO CHECK BLOOD GLUCOSE CONTINUOUSLY 2 each 3   diazepam (VALIUM) 10 MG tablet Take 1 tablet (10 mg total) by mouth daily as needed for anxiety. 90 tablet 3   DULoxetine (CYMBALTA) 60 MG capsule Take one daily 90 capsule 0   levothyroxine (SYNTHROID) 75 MCG tablet Take 1 tablet (75 mcg total) by mouth daily. 30 tablet 2   pantoprazole (PROTONIX) 40 MG tablet Take 1 tablet (40 mg total) by mouth 2 (two) times daily. 60 tablet 1   polyethylene glycol powder (GLYCOLAX/MIRALAX) 17 GM/SCOOP powder Take 17 g by mouth daily as needed for moderate constipation.     Propylene Glycol (SYSTANE BALANCE OP) Place 1 drop into both eyes 4 (four) times daily as needed (dry eyes).     rOPINIRole (REQUIP) 1 MG tablet TAKE 1 TABLET(1  MG) BY MOUTH AT BEDTIME 90 tablet 0   topiramate (TOPAMAX) 25 MG tablet Take 3 tablets (75 mg total) by mouth at bedtime. 270 tablet 3   metoCLOPramide (REGLAN) 5 MG tablet Take 1 tablet (5 mg total) by mouth 3 (three) times daily before meals. (Patient not taking: Reported on 02/13/2023) 90 tablet 5   pregabalin (LYRICA) 50 MG capsule Take 1 capsule (50 mg total) by mouth 2 (two) times daily. (Patient not taking: Reported on 02/13/2023) 60 capsule 2   No current facility-administered medications on file prior to visit.     The following portions of the patient's history were reviewed and updated as appropriate:  allergies, current medications, past family history, past medical history, past social history, past surgical history and problem list.  ROS Otherwise as in subjective above     Objective: BP 110/70   Pulse 71   Wt 167 lb 9.6 oz (76 kg)   SpO2 98%   BMI 27.89 kg/m   Wt Readings from Last 3 Encounters:  02/13/23 167 lb 9.6 oz (76 kg)  01/26/23 155 lb (70.3 kg)  01/24/23 155 lb (70.3 kg)   BP Readings from Last 3 Encounters:  02/13/23 110/70  01/24/23 132/71  11/07/22 102/60   General appearance: alert, no distress, well developed, well nourished Heart: RRR, normal S1, S2, no murmurs Lungs: decreased in general, no wheezes, rhonchi, or rales Pulses: 2+ radial pulses, 2+ pedal pulses, normal cap refill Ext: no edema MSK: Hips nontender, slightly reduced internal range of motion on the right but otherwise range of motion of hips pretty good.  Otherwise legs nontender Back: Tender in the lumbar left lower back as well as over the SI area on the left, range of motion somewhat reduced in general Seated with a walker    Assessment: Encounter Diagnoses  Name Primary?   Essential hypertension Yes   Type 2 diabetes mellitus without complication, unspecified whether long term insulin use (HCC)    Other specified hypothyroidism    Serum calcium elevated     Polyarthralgia    Chronic pain syndrome    Aortic atherosclerosis (HCC)    Mixed hyperlipidemia    Lumbar radiculopathy    Gastroparesis    Panlobular emphysema (HCC)    Myalgia due to statin    Medication adverse effect, sequela       Plan: Hypertension history, controlled off medicine currently him with prior weight loss in the past year  Diabetes-controlled with diet since losing weight this past year.  Updated labs today  Hypothyroidism-updated labs since we have changed the dose to 75 mcg last visit  Chronic pain, lumbar radiculopathy-no obvious signs of hip abnormality on exam suggest an x-ray today.  I did review back of her prior CT abdomen pelvis in lumbar spine imaging is in the chart record from the past few years.  Advise she follow-up with her pain doctor.  Advise she hold off on starting Lyrica by the podiatrist since she has prior problems with gabapentin, is on Topamax which could be titrated up and there could be topical creams that might work as well.  I will check into a topical compounded cream  Aortic atherosclerosis-we discussed heart disease risk, atherosclerosis in general and her prior statin intolerance.  She does not want to start any other medication at this time for prevention.  For now she will avoid this given prior mental follow-up, mentation changes as well as myalgias.   Gastroparesis On Reglan as needed   COPD - no recent issues Off oxygen for months Not having to use inhalers at the moment    Bryah was seen today for medical management of chronic issues.  Diagnoses and all orders for this visit:  Essential hypertension  Type 2 diabetes mellitus without complication, unspecified whether long term insulin use (HCC) -     Hemoglobin A1c  Other specified hypothyroidism -     TSH + free T4  Serum calcium elevated -     Comprehensive metabolic panel -     Parathyroid hormone, intact (no Ca)  Polyarthralgia  Chronic pain  syndrome  Aortic atherosclerosis (HCC)  Mixed hyperlipidemia  Lumbar radiculopathy  Gastroparesis  Panlobular emphysema (HCC)  Myalgia due to statin  Medication adverse effect, sequela   Follow up: pending labs

## 2023-02-14 ENCOUNTER — Other Ambulatory Visit: Payer: Self-pay | Admitting: Medical

## 2023-02-14 LAB — COMPREHENSIVE METABOLIC PANEL
ALT: 12 [IU]/L (ref 0–32)
AST: 13 [IU]/L (ref 0–40)
Albumin: 4 g/dL (ref 3.8–4.8)
Alkaline Phosphatase: 98 [IU]/L (ref 44–121)
BUN/Creatinine Ratio: 15 (ref 12–28)
BUN: 15 mg/dL (ref 8–27)
Bilirubin Total: 0.4 mg/dL (ref 0.0–1.2)
CO2: 23 mmol/L (ref 20–29)
Calcium: 10.4 mg/dL — ABNORMAL HIGH (ref 8.7–10.3)
Chloride: 103 mmol/L (ref 96–106)
Creatinine, Ser: 1 mg/dL (ref 0.57–1.00)
Globulin, Total: 2 g/dL (ref 1.5–4.5)
Glucose: 96 mg/dL (ref 70–99)
Potassium: 4.5 mmol/L (ref 3.5–5.2)
Sodium: 139 mmol/L (ref 134–144)
Total Protein: 6 g/dL (ref 6.0–8.5)
eGFR: 58 mL/min/{1.73_m2} — ABNORMAL LOW (ref >59)

## 2023-02-14 LAB — TSH+FREE T4
Free T4: 1 ng/dL (ref 0.82–1.77)
TSH: 3.66 u[IU]/mL (ref 0.450–4.500)

## 2023-02-14 LAB — HEMOGLOBIN A1C
Est. average glucose Bld gHb Est-mCnc: 94 mg/dL
Hgb A1c MFr Bld: 4.9 % (ref 4.8–5.6)

## 2023-02-14 LAB — PARATHYROID HORMONE, INTACT (NO CA): PTH: 43 pg/mL (ref 15–65)

## 2023-02-14 MED ORDER — LEVOTHYROXINE SODIUM 75 MCG PO TABS: 75.0000 ug | ORAL_TABLET | Freq: Every day | ORAL | 2 refills | Status: DC

## 2023-02-14 MED ORDER — DULOXETINE HCL 60 MG PO CPEP: ORAL_CAPSULE | ORAL | 0 refills | Status: DC

## 2023-02-14 NOTE — Addendum Note (Signed)
Addended by: Herminio Commons A on: 02/14/2023 05:02 PM   Modules accepted: Orders

## 2023-02-14 NOTE — Progress Notes (Signed)
They will fax something over a order form. It comes in 60 grams pump and will be $60-70.

## 2023-02-14 NOTE — Progress Notes (Signed)
I have placed a new referral for Homehealth for OT

## 2023-02-14 NOTE — Progress Notes (Signed)
Results sent through My Chart  Did you get my other message yesterday about calling the pharmacy about compounded cream for feet?

## 2023-02-15 NOTE — Addendum Note (Signed)
Addended by: Herminio Commons A on: 02/15/2023 12:40 PM   Modules accepted: Orders

## 2023-02-15 NOTE — Addendum Note (Signed)
Addended by: Herminio Commons A on: 02/15/2023 11:38 AM   Modules accepted: Orders

## 2023-02-22 DIAGNOSIS — E038 Other specified hypothyroidism: Secondary | ICD-10-CM | POA: Diagnosis not present

## 2023-02-22 DIAGNOSIS — I700 Atherosclerosis of aorta: Secondary | ICD-10-CM | POA: Diagnosis not present

## 2023-02-22 DIAGNOSIS — M130 Polyarthritis, unspecified: Secondary | ICD-10-CM | POA: Diagnosis not present

## 2023-02-22 DIAGNOSIS — M48061 Spinal stenosis, lumbar region without neurogenic claudication: Secondary | ICD-10-CM | POA: Diagnosis not present

## 2023-02-22 DIAGNOSIS — E1143 Type 2 diabetes mellitus with diabetic autonomic (poly)neuropathy: Secondary | ICD-10-CM | POA: Diagnosis not present

## 2023-02-22 DIAGNOSIS — F419 Anxiety disorder, unspecified: Secondary | ICD-10-CM | POA: Diagnosis not present

## 2023-02-22 DIAGNOSIS — I130 Hypertensive heart and chronic kidney disease with heart failure and stage 1 through stage 4 chronic kidney disease, or unspecified chronic kidney disease: Secondary | ICD-10-CM | POA: Diagnosis not present

## 2023-02-22 DIAGNOSIS — E1140 Type 2 diabetes mellitus with diabetic neuropathy, unspecified: Secondary | ICD-10-CM | POA: Diagnosis not present

## 2023-02-22 DIAGNOSIS — K649 Unspecified hemorrhoids: Secondary | ICD-10-CM | POA: Diagnosis not present

## 2023-02-22 DIAGNOSIS — J431 Panlobular emphysema: Secondary | ICD-10-CM | POA: Diagnosis not present

## 2023-02-22 DIAGNOSIS — F32A Depression, unspecified: Secondary | ICD-10-CM | POA: Diagnosis not present

## 2023-02-22 DIAGNOSIS — G2581 Restless legs syndrome: Secondary | ICD-10-CM | POA: Diagnosis not present

## 2023-02-22 DIAGNOSIS — T466X5D Adverse effect of antihyperlipidemic and antiarteriosclerotic drugs, subsequent encounter: Secondary | ICD-10-CM | POA: Diagnosis not present

## 2023-02-22 DIAGNOSIS — K5909 Other constipation: Secondary | ICD-10-CM | POA: Diagnosis not present

## 2023-02-22 DIAGNOSIS — E785 Hyperlipidemia, unspecified: Secondary | ICD-10-CM | POA: Diagnosis not present

## 2023-02-22 DIAGNOSIS — N189 Chronic kidney disease, unspecified: Secondary | ICD-10-CM | POA: Diagnosis not present

## 2023-02-22 DIAGNOSIS — E1122 Type 2 diabetes mellitus with diabetic chronic kidney disease: Secondary | ICD-10-CM | POA: Diagnosis not present

## 2023-02-22 DIAGNOSIS — I5032 Chronic diastolic (congestive) heart failure: Secondary | ICD-10-CM | POA: Diagnosis not present

## 2023-02-22 DIAGNOSIS — M5416 Radiculopathy, lumbar region: Secondary | ICD-10-CM | POA: Diagnosis not present

## 2023-02-22 DIAGNOSIS — M109 Gout, unspecified: Secondary | ICD-10-CM | POA: Diagnosis not present

## 2023-02-22 DIAGNOSIS — D631 Anemia in chronic kidney disease: Secondary | ICD-10-CM | POA: Diagnosis not present

## 2023-02-22 DIAGNOSIS — M791 Myalgia, unspecified site: Secondary | ICD-10-CM | POA: Diagnosis not present

## 2023-02-22 DIAGNOSIS — G894 Chronic pain syndrome: Secondary | ICD-10-CM | POA: Diagnosis not present

## 2023-02-22 DIAGNOSIS — K3184 Gastroparesis: Secondary | ICD-10-CM | POA: Diagnosis not present

## 2023-02-22 DIAGNOSIS — E782 Mixed hyperlipidemia: Secondary | ICD-10-CM | POA: Diagnosis not present

## 2023-02-23 ENCOUNTER — Telehealth: Payer: Self-pay | Admitting: Family Medicine

## 2023-02-23 DIAGNOSIS — I5032 Chronic diastolic (congestive) heart failure: Secondary | ICD-10-CM | POA: Diagnosis not present

## 2023-02-23 DIAGNOSIS — N189 Chronic kidney disease, unspecified: Secondary | ICD-10-CM | POA: Diagnosis not present

## 2023-02-23 DIAGNOSIS — E785 Hyperlipidemia, unspecified: Secondary | ICD-10-CM | POA: Diagnosis not present

## 2023-02-23 DIAGNOSIS — M109 Gout, unspecified: Secondary | ICD-10-CM | POA: Diagnosis not present

## 2023-02-23 DIAGNOSIS — F32A Depression, unspecified: Secondary | ICD-10-CM | POA: Diagnosis not present

## 2023-02-23 DIAGNOSIS — E782 Mixed hyperlipidemia: Secondary | ICD-10-CM | POA: Diagnosis not present

## 2023-02-23 DIAGNOSIS — E1122 Type 2 diabetes mellitus with diabetic chronic kidney disease: Secondary | ICD-10-CM | POA: Diagnosis not present

## 2023-02-23 DIAGNOSIS — M5416 Radiculopathy, lumbar region: Secondary | ICD-10-CM | POA: Diagnosis not present

## 2023-02-23 DIAGNOSIS — G894 Chronic pain syndrome: Secondary | ICD-10-CM | POA: Diagnosis not present

## 2023-02-23 DIAGNOSIS — M48061 Spinal stenosis, lumbar region without neurogenic claudication: Secondary | ICD-10-CM | POA: Diagnosis not present

## 2023-02-23 DIAGNOSIS — G2581 Restless legs syndrome: Secondary | ICD-10-CM | POA: Diagnosis not present

## 2023-02-23 DIAGNOSIS — T466X5D Adverse effect of antihyperlipidemic and antiarteriosclerotic drugs, subsequent encounter: Secondary | ICD-10-CM | POA: Diagnosis not present

## 2023-02-23 DIAGNOSIS — F419 Anxiety disorder, unspecified: Secondary | ICD-10-CM | POA: Diagnosis not present

## 2023-02-23 DIAGNOSIS — I130 Hypertensive heart and chronic kidney disease with heart failure and stage 1 through stage 4 chronic kidney disease, or unspecified chronic kidney disease: Secondary | ICD-10-CM | POA: Diagnosis not present

## 2023-02-23 DIAGNOSIS — E1143 Type 2 diabetes mellitus with diabetic autonomic (poly)neuropathy: Secondary | ICD-10-CM | POA: Diagnosis not present

## 2023-02-23 DIAGNOSIS — K5909 Other constipation: Secondary | ICD-10-CM | POA: Diagnosis not present

## 2023-02-23 DIAGNOSIS — E038 Other specified hypothyroidism: Secondary | ICD-10-CM | POA: Diagnosis not present

## 2023-02-23 DIAGNOSIS — J431 Panlobular emphysema: Secondary | ICD-10-CM | POA: Diagnosis not present

## 2023-02-23 DIAGNOSIS — M130 Polyarthritis, unspecified: Secondary | ICD-10-CM | POA: Diagnosis not present

## 2023-02-23 DIAGNOSIS — I700 Atherosclerosis of aorta: Secondary | ICD-10-CM | POA: Diagnosis not present

## 2023-02-23 DIAGNOSIS — K3184 Gastroparesis: Secondary | ICD-10-CM | POA: Diagnosis not present

## 2023-02-23 DIAGNOSIS — M791 Myalgia, unspecified site: Secondary | ICD-10-CM | POA: Diagnosis not present

## 2023-02-23 DIAGNOSIS — E1140 Type 2 diabetes mellitus with diabetic neuropathy, unspecified: Secondary | ICD-10-CM | POA: Diagnosis not present

## 2023-02-23 DIAGNOSIS — D631 Anemia in chronic kidney disease: Secondary | ICD-10-CM | POA: Diagnosis not present

## 2023-02-23 DIAGNOSIS — K649 Unspecified hemorrhoids: Secondary | ICD-10-CM | POA: Diagnosis not present

## 2023-02-23 NOTE — Telephone Encounter (Signed)
Kim with [facility] called for verbal orders of OT 1 per week for 8 weeks  Discharge PT eval  Call back [phone]

## 2023-02-24 NOTE — Telephone Encounter (Signed)
Called and left detailed message for Brandi Stephens giving Verbal orders

## 2023-02-24 NOTE — Telephone Encounter (Signed)
Dan called to give home health orders:  Skilled nursing every other week. PT and OT for two months.

## 2023-02-24 NOTE — Telephone Encounter (Signed)
Brandi Stephens the ok for orders

## 2023-03-01 ENCOUNTER — Telehealth: Payer: Self-pay | Admitting: Medical

## 2023-03-01 NOTE — Telephone Encounter (Signed)
Brandi Stephens called  ( states she originally called on 11/11 and left voicemail concerning this and was calling back to check status    Patient had 2 falls since getting back to [facility] and is having a lot of back pain and it is not getting any better  and she was wanting pt to possibly get xrays

## 2023-03-01 NOTE — Telephone Encounter (Signed)
Brandi Stephens with [facility] [phone]   She had called previously about getting back xray and wanted to check status

## 2023-03-01 NOTE — Telephone Encounter (Signed)
Please advise. I was not here on 11/11 and I have no voicemails on my phone about this. I am not sure who got this voicemail

## 2023-03-01 NOTE — Telephone Encounter (Signed)
error 

## 2023-03-02 NOTE — Telephone Encounter (Signed)
Pt is scheduled tomorrow for a virtual

## 2023-03-02 NOTE — Telephone Encounter (Signed)
Called and left detailed message for pt to call back to schedule a virtual visit or see someone in [facility] for the back pain and they can order a xray

## 2023-03-03 ENCOUNTER — Telehealth: Payer: Medicare HMO | Admitting: Medical

## 2023-03-03 ENCOUNTER — Telehealth: Payer: Self-pay | Admitting: Medical

## 2023-03-03 ENCOUNTER — Encounter: Payer: Self-pay | Admitting: Medical

## 2023-03-03 VITALS — BP 118/68 | HR 72 | Wt 161.0 lb

## 2023-03-03 DIAGNOSIS — M130 Polyarthritis, unspecified: Secondary | ICD-10-CM | POA: Diagnosis not present

## 2023-03-03 DIAGNOSIS — M549 Dorsalgia, unspecified: Secondary | ICD-10-CM | POA: Diagnosis not present

## 2023-03-03 DIAGNOSIS — I5032 Chronic diastolic (congestive) heart failure: Secondary | ICD-10-CM | POA: Diagnosis not present

## 2023-03-03 DIAGNOSIS — G2581 Restless legs syndrome: Secondary | ICD-10-CM | POA: Diagnosis not present

## 2023-03-03 DIAGNOSIS — M5416 Radiculopathy, lumbar region: Secondary | ICD-10-CM | POA: Diagnosis not present

## 2023-03-03 DIAGNOSIS — T466X5D Adverse effect of antihyperlipidemic and antiarteriosclerotic drugs, subsequent encounter: Secondary | ICD-10-CM | POA: Diagnosis not present

## 2023-03-03 DIAGNOSIS — E1122 Type 2 diabetes mellitus with diabetic chronic kidney disease: Secondary | ICD-10-CM | POA: Diagnosis not present

## 2023-03-03 DIAGNOSIS — I130 Hypertensive heart and chronic kidney disease with heart failure and stage 1 through stage 4 chronic kidney disease, or unspecified chronic kidney disease: Secondary | ICD-10-CM | POA: Diagnosis not present

## 2023-03-03 DIAGNOSIS — E038 Other specified hypothyroidism: Secondary | ICD-10-CM | POA: Diagnosis not present

## 2023-03-03 DIAGNOSIS — M791 Myalgia, unspecified site: Secondary | ICD-10-CM | POA: Diagnosis not present

## 2023-03-03 DIAGNOSIS — J431 Panlobular emphysema: Secondary | ICD-10-CM | POA: Diagnosis not present

## 2023-03-03 DIAGNOSIS — E1140 Type 2 diabetes mellitus with diabetic neuropathy, unspecified: Secondary | ICD-10-CM | POA: Diagnosis not present

## 2023-03-03 DIAGNOSIS — F419 Anxiety disorder, unspecified: Secondary | ICD-10-CM | POA: Diagnosis not present

## 2023-03-03 DIAGNOSIS — M25512 Pain in left shoulder: Secondary | ICD-10-CM

## 2023-03-03 DIAGNOSIS — W19XXXA Unspecified fall, initial encounter: Secondary | ICD-10-CM | POA: Diagnosis not present

## 2023-03-03 DIAGNOSIS — F32A Depression, unspecified: Secondary | ICD-10-CM | POA: Diagnosis not present

## 2023-03-03 DIAGNOSIS — K3184 Gastroparesis: Secondary | ICD-10-CM | POA: Diagnosis not present

## 2023-03-03 DIAGNOSIS — D631 Anemia in chronic kidney disease: Secondary | ICD-10-CM | POA: Diagnosis not present

## 2023-03-03 DIAGNOSIS — M48061 Spinal stenosis, lumbar region without neurogenic claudication: Secondary | ICD-10-CM | POA: Diagnosis not present

## 2023-03-03 DIAGNOSIS — E785 Hyperlipidemia, unspecified: Secondary | ICD-10-CM | POA: Diagnosis not present

## 2023-03-03 DIAGNOSIS — M109 Gout, unspecified: Secondary | ICD-10-CM | POA: Diagnosis not present

## 2023-03-03 DIAGNOSIS — I700 Atherosclerosis of aorta: Secondary | ICD-10-CM | POA: Diagnosis not present

## 2023-03-03 DIAGNOSIS — M533 Sacrococcygeal disorders, not elsewhere classified: Secondary | ICD-10-CM | POA: Diagnosis not present

## 2023-03-03 DIAGNOSIS — G894 Chronic pain syndrome: Secondary | ICD-10-CM | POA: Diagnosis not present

## 2023-03-03 DIAGNOSIS — E782 Mixed hyperlipidemia: Secondary | ICD-10-CM | POA: Diagnosis not present

## 2023-03-03 DIAGNOSIS — K649 Unspecified hemorrhoids: Secondary | ICD-10-CM | POA: Diagnosis not present

## 2023-03-03 DIAGNOSIS — N189 Chronic kidney disease, unspecified: Secondary | ICD-10-CM | POA: Diagnosis not present

## 2023-03-03 DIAGNOSIS — K5909 Other constipation: Secondary | ICD-10-CM | POA: Diagnosis not present

## 2023-03-03 DIAGNOSIS — E1143 Type 2 diabetes mellitus with diabetic autonomic (poly)neuropathy: Secondary | ICD-10-CM | POA: Diagnosis not present

## 2023-03-03 MED ORDER — OXYCODONE-ACETAMINOPHEN 5-325 MG PO TABS: 1.0000 | ORAL_TABLET | Freq: Two times a day (BID) | ORAL | 0 refills | Status: DC | PRN

## 2023-03-03 NOTE — Telephone Encounter (Signed)
Brandi Stephens with [facility] [phone]  pain level reported as a 9 today , they are aware she had telehealth appt with you today

## 2023-03-03 NOTE — Addendum Note (Signed)
Addended by: Jac Canavan on: 03/03/2023 01:01 PM   Modules accepted: Orders

## 2023-03-03 NOTE — Progress Notes (Signed)
Subjective:     Patient ID: Brandi Stephens, female   DOB: 1946/04/02, 77 y.o.   MRN: [id number]  This visit type was conducted due to national recommendations for restrictions regarding the COVID-19 Pandemic (e.g. social distancing) in an effort to limit this patient's exposure and mitigate transmission in our community.  Due to their co-morbid illnesses, this patient is at least at moderate risk for complications without adequate follow up.  This format is felt to be most appropriate for this patient at this time.    Documentation for virtual audio and video telecommunications through [place] encounter:  The patient was located at home. The provider was located in the office. The patient did consent to this visit and is aware of possible charges through their insurance for this visit.  The other persons participating in this telemedicine service were none. Time spent on call was 20 minutes and in review of previous records 20 minutes total.  This virtual service is not related to other E/M service within previous 7 days.   HPI Chief Complaint  Patient presents with   Fall    Had 2 recents falls, fell on tailbone area and radiates to back. And then having shoulder pain from the fall. Would like xrays done   Virtual for injury and fall.  She notes that she fell about 3 weeks ago.  She has a new hammertoe that she has developed in recent months.  She tripped because of the toe and ended up falling backwards landing hard on her tailbone.  Since then she has had pain in her tailbone, back both mid and low back, pain when she goes to stand, gets excruciating pain around her low back.  She also has pain in her left shoulder and has been able to lift the arm above 90 degrees since the fall.  She was already having problems with left shoulder and has been seeing physical therapy for this.  She would like x-rays of her back and shoulder and coccyx bone.  She is using Valium 2-3 times a day  and has not really helped  She is using extra strength Tylenol for pain at times.  Every time she goes to sit or stand the pain is quite significant.  She feels like she can move her hips just fine though.  No leg pain.  [place] and wants to do imaging at [facility].  Past Medical History:  Diagnosis Date   AIN (anal intraepithelial neoplasia) anal canal    Anemia    with pregnancy   Anxiety    Arthritis    knees, lower back, knees   Chronic constipation    Chronic diastolic CHF (congestive heart failure) (HCC) 07/05/2019   CKD (chronic kidney disease)    Coarse tremors    Essential   COPD (chronic obstructive pulmonary disease) (HCC)    2017 chest CT, history of   Degenerative lumbar spinal stenosis    Depression    Depression 07/05/2019   Drug dependency (HCC)    Elevated PTHrP level 05/09/2019   EtOH use 07/05/2019   Pt denies heavy drinking however   Gastroparesis    GERD (gastroesophageal reflux disease)    pt denies   Gout    Grade I diastolic dysfunction 02/01/2016   Noted ECHO   Hemorrhoids    History of adenomatous polyp of colon    History of basal cell carcinoma (BCC) excision 2015   nose  History of sepsis 05/14/2014   post lumbar surgery   History of shingles    x2   Hyperlipidemia    Hypertension    Hypothyroidism    Irregular heart beat    history of while undergoing radiation   Malignant neoplasm of upper-inner quadrant of left breast in female, estrogen receptor positive [facility]) oncologist-  dr Darnelle Catalan--  per lov in epic no recurrence   dx 07-13-2015  Left breast invasive ductal carcinoma, Stage IA, Grade 1 (TXN0),  09-16-2015  s/p  left breast lumpectomy with sln bx's,  completed radiation 11-18-2015,  started antiestrogen therapy 12-14-2015   Neuropathy    Left foot and back of left leg   Obesity (BMI 30-39.9) 07/05/2019   Personal history of radiation therapy    completed 11-18-2015  left  breast   Pneumonia    PONV (postoperative nausea and vomiting)    Prediabetes    diet controlled, no med   Restless leg syndrome    Seasonal allergies    Seizure (HCC) 05/2015   due to sepsis, only one time episode   Tremor    Wears partial dentures    lower    Current Outpatient Medications on File Prior to Visit  Medication Sig Dispense Refill   allopurinol (ZYLOPRIM) 300 MG tablet Take 300 mg by mouth daily.     Biotin 3 MG TABS Take 3 mg by mouth daily.     cefadroxil (DURICEF) 500 MG capsule Take 1 capsule (500 mg total) by mouth 2 (two) times daily. 60 capsule 11   Cholecalciferol (VITAMIN D3) 50 MCG (2000 UT) capsule Take 1 capsule (2,000 Units total) by mouth daily. 90 capsule 3   diazepam (VALIUM) 10 MG tablet Take 1 tablet (10 mg total) by mouth daily as needed for anxiety. 90 tablet 3   levothyroxine (SYNTHROID) 75 MCG tablet Take 1 tablet (75 mcg total) by mouth daily. 90 tablet 2   metoCLOPramide (REGLAN) 5 MG tablet Take 1 tablet (5 mg total) by mouth 3 (three) times daily before meals. 90 tablet 5   pantoprazole (PROTONIX) 40 MG tablet Take 1 tablet (40 mg total) by mouth 2 (two) times daily. 60 tablet 1   Propylene Glycol (SYSTANE BALANCE OP) Place 1 drop into both eyes 4 (four) times daily as needed (dry eyes).     rOPINIRole (REQUIP) 1 MG tablet TAKE 1 TABLET(1 MG) BY MOUTH AT BEDTIME 90 tablet 0   topiramate (TOPAMAX) 25 MG tablet Take 3 tablets (75 mg total) by mouth at bedtime. 270 tablet 3   colchicine 0.6 MG tablet Take 2 tablets at once then take 1 tablet an hour later then then 1 tablet twice daily x 3 days. (Patient not taking: Reported on 03/03/2023) 30 tablet 2   Continuous Glucose Sensor (FREESTYLE LIBRE 3 SENSOR) MISC PLACE 1 SENSOR ON SKIN EVERY 14 DAYS TO CHECK BLOOD GLUCOSE CONTINUOUSLY 2 each 3   polyethylene glycol powder (GLYCOLAX/MIRALAX) 17 GM/SCOOP powder Take 17 g by mouth daily as needed for moderate constipation. (Patient not taking: Reported on  03/03/2023)     No current facility-administered medications on file prior to visit.   Past Surgical History:  Procedure Laterality Date   ABDOMINAL HYSTERECTOMY  1985   BASAL CELL CARCINOMA EXCISION  10/15   nose   BIOPSY  07/22/2022   Procedure: BIOPSY;  Surgeon: Meryl Dare, MD;  Location: [facility];  Service: Gastroenterology;;   BREAST BIOPSY Right 08/24/2015   BREAST BIOPSY Right 07/13/2015  BREAST BIOPSY Left 07/13/2015   BREAST EXCISIONAL BIOPSY Left 1972   BREAST LUMPECTOMY Left    BREAST LUMPECTOMY WITH RADIOACTIVE SEED AND SENTINEL LYMPH NODE BIOPSY Left 09/16/2015   Procedure: BREAST LUMPECTOMY WITH RADIOACTIVE SEED AND SENTINEL LYMPH NODE BIOPSY;  Surgeon: Almond Lint, MD;  Location: [facility];  Service: General;  Laterality: Left;   BREAST REDUCTION SURGERY Bilateral 1998   CATARACT EXTRACTION W/ INTRAOCULAR LENS IMPLANT Left 2016   CERVICAL SPINE SURGERY  1995   hallo   COLONOSCOPY  01/2013   polyps   ESOPHAGOGASTRODUODENOSCOPY N/A 07/22/2022   Procedure: ESOPHAGOGASTRODUODENOSCOPY (EGD);  Surgeon: Meryl Dare, MD;  Location: [facility];  Service: Gastroenterology;  Laterality: N/A;   EYE SURGERY Left 2016   HAND SURGERY Left 02-19-2002   dr Merlyn Lot  @[facility]    repair collateral ligament/ MPJ left thumb   HARDWARE REMOVAL N/A 07/24/2019   Procedure: REVISION OF HARDWARE Lumbar two - Sacral one. Extention of Fusion to Lumbar one;  Surgeon: [name], MD;  Location: [facility] OR;  Service: Neurosurgery;  Laterality: N/A;   HEMORRHOID SURGERY  2008   HIGH RESOLUTION ANOSCOPY N/A 02/28/2018   Procedure: HIGH RESOLUTION ANOSCOPY WITH BIOPSY;  Surgeon: Romie Levee, MD;  Location: [facility];  Service: General;  Laterality: N/A;   KNEE ARTHROSCOPY Right 2002   LUMBAR SPINE SURGERY  03-20-2015   dr Conchita Paris   fusion L4-5, L5-S1   MOUTH SURGERY  07/14/2018   2 infected teeth removed   MULTIPLE TOOTH EXTRACTIONS     with bone  grafting lower bottom right and left   REDUCTION MAMMAPLASTY Bilateral    RIGHT COLECTOMY  09-10-2003   dr Jamey Ripa @[facility]    multiple colon polyps (per path tubular adenoma's, hyperplastic , benign appendix, benign two lymph nodes   ROTATOR CUFF REPAIR Left 04/2016   TONSILLECTOMY  1969   TUBAL LIGATION Bilateral 1982    Review of Systems As in subjective    Objective:   Physical Exam Due to coronavirus pandemic stay at home measures, patient visit was virtual and they were not examined in person.   BP 118/68   Pulse 72   Wt 161 lb (73 kg)   SpO2 98%   BMI 26.79 kg/m   Unfortunately the video is coming in now and I was not able to examine her appropriately visually or in person  No acute distress     Assessment:     Encounter Diagnoses  Name Primary?   Fall, initial encounter Yes   Acute pain of left shoulder    Acute back pain, unspecified back location, unspecified back pain laterality    Coccyxdynia        Plan:     We discussed limitation of virtual consult.  I had previously asked that [place].  Nevertheless she ended up doing a visit virtually here now.  I placed orders for x-rays today and fax the orders to [facility] imaging , [address] center, [place], [place]  Continue relative rest, Valium as needed up to twice a day, Tylenol for pain over-the-counter or medication below for breakthrough pain.  Brigitt was seen today for fall.  Diagnoses and all orders for this visit:  Fall, initial encounter -     DG Sacrum/Coccyx; Future -     DG THORACOLUMBAR SPINE; Future -     DG Shoulder Left; Future  Acute pain of  left shoulder -     DG Sacrum/Coccyx; Future -     DG THORACOLUMBAR SPINE; Future -     DG Shoulder Left; Future  Acute back pain, unspecified back location, unspecified back pain laterality -     DG Sacrum/Coccyx; Future -     DG THORACOLUMBAR SPINE; Future -     DG Shoulder Left;  Future  Coccyxdynia -     DG Sacrum/Coccyx; Future -     DG THORACOLUMBAR SPINE; Future -     DG Shoulder Left; Future     Follow-up pending x-rays.  Continue with physical therapy you are already doing.

## 2023-03-06 ENCOUNTER — Telehealth: Payer: Self-pay | Admitting: Medical

## 2023-03-06 NOTE — Telephone Encounter (Signed)
Percocet is causing itching bad, it helps but the itching is bad ( she is using Bendaryl)  Wanted to know if there was something else that hopefully would not cause the itching

## 2023-03-06 NOTE — Telephone Encounter (Signed)
Brandi Stephens was going to notify pt to call to get scheduled for xrays

## 2023-03-06 NOTE — Telephone Encounter (Signed)
Left message for pt to call me back 

## 2023-03-07 DIAGNOSIS — M19012 Primary osteoarthritis, left shoulder: Secondary | ICD-10-CM | POA: Diagnosis not present

## 2023-03-07 DIAGNOSIS — M25512 Pain in left shoulder: Secondary | ICD-10-CM | POA: Diagnosis not present

## 2023-03-07 DIAGNOSIS — I878 Other specified disorders of veins: Secondary | ICD-10-CM | POA: Diagnosis not present

## 2023-03-07 NOTE — Telephone Encounter (Signed)
Pt was notified to take 1/2 tablet she said she has already tried that as well and still itchy bad. She would like a Muscle relaxer of some sort to help relief some of the spasms and another kind of pain medication. She had xrays done at 1pm today.

## 2023-03-08 ENCOUNTER — Other Ambulatory Visit: Payer: Self-pay | Admitting: Medical

## 2023-03-08 MED ORDER — TRAMADOL HCL 50 MG PO TABS: 50.0000 mg | ORAL_TABLET | Freq: Two times a day (BID) | ORAL | 0 refills | Status: DC | PRN

## 2023-03-08 MED ORDER — TIZANIDINE HCL 4 MG PO TABS
4.0000 mg | ORAL_TABLET | Freq: Four times a day (QID) | ORAL | 0 refills | Status: DC | PRN
Start: 2023-03-08 — End: 2023-03-21

## 2023-03-08 NOTE — Telephone Encounter (Signed)
Pt was notified.  

## 2023-03-08 NOTE — Telephone Encounter (Signed)
Left message for pt to call me back 

## 2023-03-09 ENCOUNTER — Encounter: Payer: Self-pay | Admitting: Internal Medicine

## 2023-03-09 NOTE — Telephone Encounter (Signed)
Pt was notified of results

## 2023-03-09 NOTE — Telephone Encounter (Signed)
Back x-rays show likely chronic right compression deformity of T12 vertebrae, sacrum and coccyx x-ray does not show any worrisome findings, shoulder x-ray showed degenerative changes of the shoulder but no fracture  Continue with therapy.  Consider follow-up with orthopedics in general regarding pain and these findings

## 2023-03-10 DIAGNOSIS — J431 Panlobular emphysema: Secondary | ICD-10-CM | POA: Diagnosis not present

## 2023-03-10 DIAGNOSIS — E038 Other specified hypothyroidism: Secondary | ICD-10-CM | POA: Diagnosis not present

## 2023-03-10 DIAGNOSIS — M109 Gout, unspecified: Secondary | ICD-10-CM | POA: Diagnosis not present

## 2023-03-10 DIAGNOSIS — M130 Polyarthritis, unspecified: Secondary | ICD-10-CM | POA: Diagnosis not present

## 2023-03-10 DIAGNOSIS — E1140 Type 2 diabetes mellitus with diabetic neuropathy, unspecified: Secondary | ICD-10-CM | POA: Diagnosis not present

## 2023-03-10 DIAGNOSIS — K649 Unspecified hemorrhoids: Secondary | ICD-10-CM | POA: Diagnosis not present

## 2023-03-10 DIAGNOSIS — N189 Chronic kidney disease, unspecified: Secondary | ICD-10-CM | POA: Diagnosis not present

## 2023-03-10 DIAGNOSIS — I130 Hypertensive heart and chronic kidney disease with heart failure and stage 1 through stage 4 chronic kidney disease, or unspecified chronic kidney disease: Secondary | ICD-10-CM | POA: Diagnosis not present

## 2023-03-10 DIAGNOSIS — K3184 Gastroparesis: Secondary | ICD-10-CM | POA: Diagnosis not present

## 2023-03-10 DIAGNOSIS — M48061 Spinal stenosis, lumbar region without neurogenic claudication: Secondary | ICD-10-CM | POA: Diagnosis not present

## 2023-03-10 DIAGNOSIS — F419 Anxiety disorder, unspecified: Secondary | ICD-10-CM | POA: Diagnosis not present

## 2023-03-10 DIAGNOSIS — E785 Hyperlipidemia, unspecified: Secondary | ICD-10-CM | POA: Diagnosis not present

## 2023-03-10 DIAGNOSIS — T466X5D Adverse effect of antihyperlipidemic and antiarteriosclerotic drugs, subsequent encounter: Secondary | ICD-10-CM | POA: Diagnosis not present

## 2023-03-10 DIAGNOSIS — E782 Mixed hyperlipidemia: Secondary | ICD-10-CM | POA: Diagnosis not present

## 2023-03-10 DIAGNOSIS — D631 Anemia in chronic kidney disease: Secondary | ICD-10-CM | POA: Diagnosis not present

## 2023-03-10 DIAGNOSIS — I5032 Chronic diastolic (congestive) heart failure: Secondary | ICD-10-CM | POA: Diagnosis not present

## 2023-03-10 DIAGNOSIS — F32A Depression, unspecified: Secondary | ICD-10-CM | POA: Diagnosis not present

## 2023-03-10 DIAGNOSIS — M5416 Radiculopathy, lumbar region: Secondary | ICD-10-CM | POA: Diagnosis not present

## 2023-03-10 DIAGNOSIS — E1122 Type 2 diabetes mellitus with diabetic chronic kidney disease: Secondary | ICD-10-CM | POA: Diagnosis not present

## 2023-03-10 DIAGNOSIS — I700 Atherosclerosis of aorta: Secondary | ICD-10-CM | POA: Diagnosis not present

## 2023-03-10 DIAGNOSIS — E1143 Type 2 diabetes mellitus with diabetic autonomic (poly)neuropathy: Secondary | ICD-10-CM | POA: Diagnosis not present

## 2023-03-10 DIAGNOSIS — K5909 Other constipation: Secondary | ICD-10-CM | POA: Diagnosis not present

## 2023-03-10 DIAGNOSIS — G2581 Restless legs syndrome: Secondary | ICD-10-CM | POA: Diagnosis not present

## 2023-03-10 DIAGNOSIS — G894 Chronic pain syndrome: Secondary | ICD-10-CM | POA: Diagnosis not present

## 2023-03-10 DIAGNOSIS — M791 Myalgia, unspecified site: Secondary | ICD-10-CM | POA: Diagnosis not present

## 2023-03-15 DIAGNOSIS — K649 Unspecified hemorrhoids: Secondary | ICD-10-CM | POA: Diagnosis not present

## 2023-03-15 DIAGNOSIS — D631 Anemia in chronic kidney disease: Secondary | ICD-10-CM | POA: Diagnosis not present

## 2023-03-15 DIAGNOSIS — G2581 Restless legs syndrome: Secondary | ICD-10-CM | POA: Diagnosis not present

## 2023-03-15 DIAGNOSIS — K3184 Gastroparesis: Secondary | ICD-10-CM | POA: Diagnosis not present

## 2023-03-15 DIAGNOSIS — I700 Atherosclerosis of aorta: Secondary | ICD-10-CM | POA: Diagnosis not present

## 2023-03-15 DIAGNOSIS — I5032 Chronic diastolic (congestive) heart failure: Secondary | ICD-10-CM | POA: Diagnosis not present

## 2023-03-15 DIAGNOSIS — N189 Chronic kidney disease, unspecified: Secondary | ICD-10-CM | POA: Diagnosis not present

## 2023-03-15 DIAGNOSIS — K5909 Other constipation: Secondary | ICD-10-CM | POA: Diagnosis not present

## 2023-03-15 DIAGNOSIS — E1140 Type 2 diabetes mellitus with diabetic neuropathy, unspecified: Secondary | ICD-10-CM | POA: Diagnosis not present

## 2023-03-15 DIAGNOSIS — M791 Myalgia, unspecified site: Secondary | ICD-10-CM | POA: Diagnosis not present

## 2023-03-15 DIAGNOSIS — E038 Other specified hypothyroidism: Secondary | ICD-10-CM | POA: Diagnosis not present

## 2023-03-15 DIAGNOSIS — M130 Polyarthritis, unspecified: Secondary | ICD-10-CM | POA: Diagnosis not present

## 2023-03-15 DIAGNOSIS — T466X5D Adverse effect of antihyperlipidemic and antiarteriosclerotic drugs, subsequent encounter: Secondary | ICD-10-CM | POA: Diagnosis not present

## 2023-03-15 DIAGNOSIS — G894 Chronic pain syndrome: Secondary | ICD-10-CM | POA: Diagnosis not present

## 2023-03-15 DIAGNOSIS — E782 Mixed hyperlipidemia: Secondary | ICD-10-CM | POA: Diagnosis not present

## 2023-03-15 DIAGNOSIS — J431 Panlobular emphysema: Secondary | ICD-10-CM | POA: Diagnosis not present

## 2023-03-15 DIAGNOSIS — E785 Hyperlipidemia, unspecified: Secondary | ICD-10-CM | POA: Diagnosis not present

## 2023-03-15 DIAGNOSIS — F32A Depression, unspecified: Secondary | ICD-10-CM | POA: Diagnosis not present

## 2023-03-15 DIAGNOSIS — F419 Anxiety disorder, unspecified: Secondary | ICD-10-CM | POA: Diagnosis not present

## 2023-03-15 DIAGNOSIS — M109 Gout, unspecified: Secondary | ICD-10-CM | POA: Diagnosis not present

## 2023-03-15 DIAGNOSIS — M5416 Radiculopathy, lumbar region: Secondary | ICD-10-CM | POA: Diagnosis not present

## 2023-03-15 DIAGNOSIS — M48061 Spinal stenosis, lumbar region without neurogenic claudication: Secondary | ICD-10-CM | POA: Diagnosis not present

## 2023-03-15 DIAGNOSIS — E1122 Type 2 diabetes mellitus with diabetic chronic kidney disease: Secondary | ICD-10-CM | POA: Diagnosis not present

## 2023-03-15 DIAGNOSIS — I130 Hypertensive heart and chronic kidney disease with heart failure and stage 1 through stage 4 chronic kidney disease, or unspecified chronic kidney disease: Secondary | ICD-10-CM | POA: Diagnosis not present

## 2023-03-15 DIAGNOSIS — E1143 Type 2 diabetes mellitus with diabetic autonomic (poly)neuropathy: Secondary | ICD-10-CM | POA: Diagnosis not present

## 2023-03-20 ENCOUNTER — Telehealth: Payer: Self-pay | Admitting: Medical

## 2023-03-20 NOTE — Telephone Encounter (Signed)
Pt wants you to call her about referral and her meds  She wants refills on tramadol and muscle relaxer

## 2023-03-21 ENCOUNTER — Other Ambulatory Visit: Payer: Self-pay | Admitting: Medical

## 2023-03-21 ENCOUNTER — Other Ambulatory Visit: Payer: Self-pay | Admitting: Internal Medicine

## 2023-03-21 DIAGNOSIS — M549 Dorsalgia, unspecified: Secondary | ICD-10-CM

## 2023-03-21 DIAGNOSIS — M533 Sacrococcygeal disorders, not elsewhere classified: Secondary | ICD-10-CM

## 2023-03-21 DIAGNOSIS — M25512 Pain in left shoulder: Secondary | ICD-10-CM

## 2023-03-21 DIAGNOSIS — W19XXXA Unspecified fall, initial encounter: Secondary | ICD-10-CM

## 2023-03-21 MED ORDER — TIZANIDINE HCL 4 MG PO TABS: 4.0000 mg | ORAL_TABLET | Freq: Four times a day (QID) | ORAL | 0 refills | Status: DC | PRN

## 2023-03-21 MED ORDER — TRAMADOL HCL 50 MG PO TABS: 50.0000 mg | ORAL_TABLET | Freq: Every day | ORAL | 0 refills | Status: DC | PRN

## 2023-03-21 NOTE — Telephone Encounter (Signed)
Can we try to get a urgent ortho appointment for patient. She lives at [facility] for a couple months

## 2023-03-23 ENCOUNTER — Encounter: Payer: Self-pay | Admitting: Medical

## 2023-03-23 DIAGNOSIS — M130 Polyarthritis, unspecified: Secondary | ICD-10-CM | POA: Diagnosis not present

## 2023-03-23 DIAGNOSIS — M5416 Radiculopathy, lumbar region: Secondary | ICD-10-CM | POA: Diagnosis not present

## 2023-03-23 DIAGNOSIS — E1140 Type 2 diabetes mellitus with diabetic neuropathy, unspecified: Secondary | ICD-10-CM | POA: Diagnosis not present

## 2023-03-23 DIAGNOSIS — T466X5D Adverse effect of antihyperlipidemic and antiarteriosclerotic drugs, subsequent encounter: Secondary | ICD-10-CM | POA: Diagnosis not present

## 2023-03-23 DIAGNOSIS — E782 Mixed hyperlipidemia: Secondary | ICD-10-CM | POA: Diagnosis not present

## 2023-03-23 DIAGNOSIS — J431 Panlobular emphysema: Secondary | ICD-10-CM | POA: Diagnosis not present

## 2023-03-23 DIAGNOSIS — E785 Hyperlipidemia, unspecified: Secondary | ICD-10-CM | POA: Diagnosis not present

## 2023-03-23 DIAGNOSIS — F419 Anxiety disorder, unspecified: Secondary | ICD-10-CM | POA: Diagnosis not present

## 2023-03-23 DIAGNOSIS — D631 Anemia in chronic kidney disease: Secondary | ICD-10-CM | POA: Diagnosis not present

## 2023-03-23 DIAGNOSIS — I5032 Chronic diastolic (congestive) heart failure: Secondary | ICD-10-CM | POA: Diagnosis not present

## 2023-03-23 DIAGNOSIS — E1143 Type 2 diabetes mellitus with diabetic autonomic (poly)neuropathy: Secondary | ICD-10-CM | POA: Diagnosis not present

## 2023-03-23 DIAGNOSIS — M48061 Spinal stenosis, lumbar region without neurogenic claudication: Secondary | ICD-10-CM | POA: Diagnosis not present

## 2023-03-23 DIAGNOSIS — I130 Hypertensive heart and chronic kidney disease with heart failure and stage 1 through stage 4 chronic kidney disease, or unspecified chronic kidney disease: Secondary | ICD-10-CM | POA: Diagnosis not present

## 2023-03-23 DIAGNOSIS — M791 Myalgia, unspecified site: Secondary | ICD-10-CM | POA: Diagnosis not present

## 2023-03-23 DIAGNOSIS — G2581 Restless legs syndrome: Secondary | ICD-10-CM | POA: Diagnosis not present

## 2023-03-23 DIAGNOSIS — G894 Chronic pain syndrome: Secondary | ICD-10-CM | POA: Diagnosis not present

## 2023-03-23 DIAGNOSIS — K5909 Other constipation: Secondary | ICD-10-CM | POA: Diagnosis not present

## 2023-03-23 DIAGNOSIS — E038 Other specified hypothyroidism: Secondary | ICD-10-CM | POA: Diagnosis not present

## 2023-03-23 DIAGNOSIS — K649 Unspecified hemorrhoids: Secondary | ICD-10-CM | POA: Diagnosis not present

## 2023-03-23 DIAGNOSIS — E1122 Type 2 diabetes mellitus with diabetic chronic kidney disease: Secondary | ICD-10-CM | POA: Diagnosis not present

## 2023-03-23 DIAGNOSIS — K3184 Gastroparesis: Secondary | ICD-10-CM | POA: Diagnosis not present

## 2023-03-23 DIAGNOSIS — M109 Gout, unspecified: Secondary | ICD-10-CM | POA: Diagnosis not present

## 2023-03-23 DIAGNOSIS — I700 Atherosclerosis of aorta: Secondary | ICD-10-CM | POA: Diagnosis not present

## 2023-03-23 DIAGNOSIS — F32A Depression, unspecified: Secondary | ICD-10-CM | POA: Diagnosis not present

## 2023-03-23 DIAGNOSIS — N189 Chronic kidney disease, unspecified: Secondary | ICD-10-CM | POA: Diagnosis not present

## 2023-03-24 DIAGNOSIS — G2581 Restless legs syndrome: Secondary | ICD-10-CM | POA: Diagnosis not present

## 2023-03-24 DIAGNOSIS — K3184 Gastroparesis: Secondary | ICD-10-CM | POA: Diagnosis not present

## 2023-03-24 DIAGNOSIS — M48061 Spinal stenosis, lumbar region without neurogenic claudication: Secondary | ICD-10-CM | POA: Diagnosis not present

## 2023-03-24 DIAGNOSIS — G894 Chronic pain syndrome: Secondary | ICD-10-CM | POA: Diagnosis not present

## 2023-03-24 DIAGNOSIS — K649 Unspecified hemorrhoids: Secondary | ICD-10-CM | POA: Diagnosis not present

## 2023-03-24 DIAGNOSIS — D631 Anemia in chronic kidney disease: Secondary | ICD-10-CM | POA: Diagnosis not present

## 2023-03-24 DIAGNOSIS — F32A Depression, unspecified: Secondary | ICD-10-CM | POA: Diagnosis not present

## 2023-03-24 DIAGNOSIS — E782 Mixed hyperlipidemia: Secondary | ICD-10-CM | POA: Diagnosis not present

## 2023-03-24 DIAGNOSIS — M5416 Radiculopathy, lumbar region: Secondary | ICD-10-CM | POA: Diagnosis not present

## 2023-03-24 DIAGNOSIS — K5909 Other constipation: Secondary | ICD-10-CM | POA: Diagnosis not present

## 2023-03-24 DIAGNOSIS — M130 Polyarthritis, unspecified: Secondary | ICD-10-CM | POA: Diagnosis not present

## 2023-03-24 DIAGNOSIS — I130 Hypertensive heart and chronic kidney disease with heart failure and stage 1 through stage 4 chronic kidney disease, or unspecified chronic kidney disease: Secondary | ICD-10-CM | POA: Diagnosis not present

## 2023-03-24 DIAGNOSIS — M109 Gout, unspecified: Secondary | ICD-10-CM | POA: Diagnosis not present

## 2023-03-24 DIAGNOSIS — I5032 Chronic diastolic (congestive) heart failure: Secondary | ICD-10-CM | POA: Diagnosis not present

## 2023-03-24 DIAGNOSIS — E038 Other specified hypothyroidism: Secondary | ICD-10-CM | POA: Diagnosis not present

## 2023-03-24 DIAGNOSIS — E1143 Type 2 diabetes mellitus with diabetic autonomic (poly)neuropathy: Secondary | ICD-10-CM | POA: Diagnosis not present

## 2023-03-24 DIAGNOSIS — T466X5D Adverse effect of antihyperlipidemic and antiarteriosclerotic drugs, subsequent encounter: Secondary | ICD-10-CM | POA: Diagnosis not present

## 2023-03-24 DIAGNOSIS — J431 Panlobular emphysema: Secondary | ICD-10-CM | POA: Diagnosis not present

## 2023-03-24 DIAGNOSIS — E1122 Type 2 diabetes mellitus with diabetic chronic kidney disease: Secondary | ICD-10-CM | POA: Diagnosis not present

## 2023-03-24 DIAGNOSIS — N189 Chronic kidney disease, unspecified: Secondary | ICD-10-CM | POA: Diagnosis not present

## 2023-03-24 DIAGNOSIS — M791 Myalgia, unspecified site: Secondary | ICD-10-CM | POA: Diagnosis not present

## 2023-03-24 DIAGNOSIS — I700 Atherosclerosis of aorta: Secondary | ICD-10-CM | POA: Diagnosis not present

## 2023-03-24 DIAGNOSIS — E1140 Type 2 diabetes mellitus with diabetic neuropathy, unspecified: Secondary | ICD-10-CM | POA: Diagnosis not present

## 2023-03-24 DIAGNOSIS — F419 Anxiety disorder, unspecified: Secondary | ICD-10-CM | POA: Diagnosis not present

## 2023-03-24 DIAGNOSIS — E785 Hyperlipidemia, unspecified: Secondary | ICD-10-CM | POA: Diagnosis not present

## 2023-03-27 ENCOUNTER — Telehealth: Payer: Self-pay

## 2023-03-27 DIAGNOSIS — M5416 Radiculopathy, lumbar region: Secondary | ICD-10-CM | POA: Diagnosis not present

## 2023-03-27 DIAGNOSIS — M4854XA Collapsed vertebra, not elsewhere classified, thoracic region, initial encounter for fracture: Secondary | ICD-10-CM | POA: Diagnosis not present

## 2023-03-27 DIAGNOSIS — M51369 Other intervertebral disc degeneration, lumbar region without mention of lumbar back pain or lower extremity pain: Secondary | ICD-10-CM | POA: Diagnosis not present

## 2023-03-27 DIAGNOSIS — Z981 Arthrodesis status: Secondary | ICD-10-CM | POA: Diagnosis not present

## 2023-03-27 NOTE — Progress Notes (Signed)
   03/27/2023  Patient ID: Brandi Stephens, female   DOB: 1945/09/11, 77 y.o.   MRN: [id number]  Contacted patient to confirm she is still taking Ozempic 1mg  and to discuss 2025 PAP renewal through [employer].  Patient confirms she is still taking this medication with no concerns. Submitted patient portion of renewal application via online portal with patient consent.  Sherrill Raring, PharmD Clinical Pharmacist [phone]

## 2023-03-30 DIAGNOSIS — I130 Hypertensive heart and chronic kidney disease with heart failure and stage 1 through stage 4 chronic kidney disease, or unspecified chronic kidney disease: Secondary | ICD-10-CM | POA: Diagnosis not present

## 2023-03-30 DIAGNOSIS — T466X5D Adverse effect of antihyperlipidemic and antiarteriosclerotic drugs, subsequent encounter: Secondary | ICD-10-CM | POA: Diagnosis not present

## 2023-03-30 DIAGNOSIS — K3184 Gastroparesis: Secondary | ICD-10-CM | POA: Diagnosis not present

## 2023-03-30 DIAGNOSIS — F419 Anxiety disorder, unspecified: Secondary | ICD-10-CM | POA: Diagnosis not present

## 2023-03-30 DIAGNOSIS — M791 Myalgia, unspecified site: Secondary | ICD-10-CM | POA: Diagnosis not present

## 2023-03-30 DIAGNOSIS — M130 Polyarthritis, unspecified: Secondary | ICD-10-CM | POA: Diagnosis not present

## 2023-03-30 DIAGNOSIS — I700 Atherosclerosis of aorta: Secondary | ICD-10-CM | POA: Diagnosis not present

## 2023-03-30 DIAGNOSIS — I5032 Chronic diastolic (congestive) heart failure: Secondary | ICD-10-CM | POA: Diagnosis not present

## 2023-03-30 DIAGNOSIS — E1143 Type 2 diabetes mellitus with diabetic autonomic (poly)neuropathy: Secondary | ICD-10-CM | POA: Diagnosis not present

## 2023-03-30 DIAGNOSIS — K649 Unspecified hemorrhoids: Secondary | ICD-10-CM | POA: Diagnosis not present

## 2023-03-30 DIAGNOSIS — E1122 Type 2 diabetes mellitus with diabetic chronic kidney disease: Secondary | ICD-10-CM | POA: Diagnosis not present

## 2023-03-30 DIAGNOSIS — G2581 Restless legs syndrome: Secondary | ICD-10-CM | POA: Diagnosis not present

## 2023-03-30 DIAGNOSIS — J431 Panlobular emphysema: Secondary | ICD-10-CM | POA: Diagnosis not present

## 2023-03-30 DIAGNOSIS — E785 Hyperlipidemia, unspecified: Secondary | ICD-10-CM | POA: Diagnosis not present

## 2023-03-30 DIAGNOSIS — N189 Chronic kidney disease, unspecified: Secondary | ICD-10-CM | POA: Diagnosis not present

## 2023-03-30 DIAGNOSIS — D631 Anemia in chronic kidney disease: Secondary | ICD-10-CM | POA: Diagnosis not present

## 2023-03-30 DIAGNOSIS — M5416 Radiculopathy, lumbar region: Secondary | ICD-10-CM | POA: Diagnosis not present

## 2023-03-30 DIAGNOSIS — E038 Other specified hypothyroidism: Secondary | ICD-10-CM | POA: Diagnosis not present

## 2023-03-30 DIAGNOSIS — G894 Chronic pain syndrome: Secondary | ICD-10-CM | POA: Diagnosis not present

## 2023-03-30 DIAGNOSIS — F32A Depression, unspecified: Secondary | ICD-10-CM | POA: Diagnosis not present

## 2023-03-30 DIAGNOSIS — E1140 Type 2 diabetes mellitus with diabetic neuropathy, unspecified: Secondary | ICD-10-CM | POA: Diagnosis not present

## 2023-03-30 DIAGNOSIS — E782 Mixed hyperlipidemia: Secondary | ICD-10-CM | POA: Diagnosis not present

## 2023-03-30 DIAGNOSIS — M109 Gout, unspecified: Secondary | ICD-10-CM | POA: Diagnosis not present

## 2023-03-30 DIAGNOSIS — K5909 Other constipation: Secondary | ICD-10-CM | POA: Diagnosis not present

## 2023-03-30 DIAGNOSIS — M48061 Spinal stenosis, lumbar region without neurogenic claudication: Secondary | ICD-10-CM | POA: Diagnosis not present

## 2023-04-04 DIAGNOSIS — T466X5D Adverse effect of antihyperlipidemic and antiarteriosclerotic drugs, subsequent encounter: Secondary | ICD-10-CM | POA: Diagnosis not present

## 2023-04-04 DIAGNOSIS — I700 Atherosclerosis of aorta: Secondary | ICD-10-CM | POA: Diagnosis not present

## 2023-04-04 DIAGNOSIS — I130 Hypertensive heart and chronic kidney disease with heart failure and stage 1 through stage 4 chronic kidney disease, or unspecified chronic kidney disease: Secondary | ICD-10-CM | POA: Diagnosis not present

## 2023-04-04 DIAGNOSIS — M130 Polyarthritis, unspecified: Secondary | ICD-10-CM | POA: Diagnosis not present

## 2023-04-04 DIAGNOSIS — F32A Depression, unspecified: Secondary | ICD-10-CM | POA: Diagnosis not present

## 2023-04-04 DIAGNOSIS — M109 Gout, unspecified: Secondary | ICD-10-CM | POA: Diagnosis not present

## 2023-04-04 DIAGNOSIS — M5416 Radiculopathy, lumbar region: Secondary | ICD-10-CM | POA: Diagnosis not present

## 2023-04-04 DIAGNOSIS — E782 Mixed hyperlipidemia: Secondary | ICD-10-CM | POA: Diagnosis not present

## 2023-04-04 DIAGNOSIS — E785 Hyperlipidemia, unspecified: Secondary | ICD-10-CM | POA: Diagnosis not present

## 2023-04-04 DIAGNOSIS — K649 Unspecified hemorrhoids: Secondary | ICD-10-CM | POA: Diagnosis not present

## 2023-04-04 DIAGNOSIS — M791 Myalgia, unspecified site: Secondary | ICD-10-CM | POA: Diagnosis not present

## 2023-04-04 DIAGNOSIS — G2581 Restless legs syndrome: Secondary | ICD-10-CM | POA: Diagnosis not present

## 2023-04-04 DIAGNOSIS — F419 Anxiety disorder, unspecified: Secondary | ICD-10-CM | POA: Diagnosis not present

## 2023-04-04 DIAGNOSIS — N189 Chronic kidney disease, unspecified: Secondary | ICD-10-CM | POA: Diagnosis not present

## 2023-04-04 DIAGNOSIS — I5032 Chronic diastolic (congestive) heart failure: Secondary | ICD-10-CM | POA: Diagnosis not present

## 2023-04-04 DIAGNOSIS — J431 Panlobular emphysema: Secondary | ICD-10-CM | POA: Diagnosis not present

## 2023-04-04 DIAGNOSIS — K5909 Other constipation: Secondary | ICD-10-CM | POA: Diagnosis not present

## 2023-04-04 DIAGNOSIS — K3184 Gastroparesis: Secondary | ICD-10-CM | POA: Diagnosis not present

## 2023-04-04 DIAGNOSIS — D631 Anemia in chronic kidney disease: Secondary | ICD-10-CM | POA: Diagnosis not present

## 2023-04-04 DIAGNOSIS — M48061 Spinal stenosis, lumbar region without neurogenic claudication: Secondary | ICD-10-CM | POA: Diagnosis not present

## 2023-04-04 DIAGNOSIS — E1122 Type 2 diabetes mellitus with diabetic chronic kidney disease: Secondary | ICD-10-CM | POA: Diagnosis not present

## 2023-04-04 DIAGNOSIS — G894 Chronic pain syndrome: Secondary | ICD-10-CM | POA: Diagnosis not present

## 2023-04-04 DIAGNOSIS — E038 Other specified hypothyroidism: Secondary | ICD-10-CM | POA: Diagnosis not present

## 2023-04-04 DIAGNOSIS — E1143 Type 2 diabetes mellitus with diabetic autonomic (poly)neuropathy: Secondary | ICD-10-CM | POA: Diagnosis not present

## 2023-04-04 DIAGNOSIS — E1140 Type 2 diabetes mellitus with diabetic neuropathy, unspecified: Secondary | ICD-10-CM | POA: Diagnosis not present

## 2023-04-07 ENCOUNTER — Encounter: Payer: Medicare HMO | Attending: Physical Medicine and Rehabilitation | Admitting: Physical Medicine and Rehabilitation

## 2023-04-07 VITALS — BP 124/90 | HR 106 | Ht 65.0 in | Wt 156.8 lb

## 2023-04-07 DIAGNOSIS — M2040 Other hammer toe(s) (acquired), unspecified foot: Secondary | ICD-10-CM | POA: Diagnosis not present

## 2023-04-07 DIAGNOSIS — E1143 Type 2 diabetes mellitus with diabetic autonomic (poly)neuropathy: Secondary | ICD-10-CM

## 2023-04-07 DIAGNOSIS — G629 Polyneuropathy, unspecified: Secondary | ICD-10-CM | POA: Insufficient documentation

## 2023-04-07 DIAGNOSIS — G4701 Insomnia due to medical condition: Secondary | ICD-10-CM | POA: Diagnosis not present

## 2023-04-07 DIAGNOSIS — F411 Generalized anxiety disorder: Secondary | ICD-10-CM | POA: Insufficient documentation

## 2023-04-07 MED ORDER — DIAZEPAM 10 MG PO TABS: 10.0000 mg | ORAL_TABLET | Freq: Every day | ORAL | 3 refills | Status: DC | PRN

## 2023-04-07 MED ORDER — TOPIRAMATE 100 MG PO TABS: 100.0000 mg | ORAL_TABLET | Freq: Every evening | ORAL | 2 refills | Status: DC

## 2023-04-07 MED ORDER — MELOXICAM 7.5 MG PO TABS: 7.5000 mg | ORAL_TABLET | Freq: Every day | ORAL | 3 refills | Status: DC | PRN

## 2023-04-07 NOTE — Progress Notes (Signed)
Subjective:    Patient ID: Brandi Stephens, female    DOB: Feb 09, 1946, 77 y.o.   MRN: [id number]   Mrs. Draft presents for f/u after CIR admission for staph bacteremia after infection of her spinal hardware, and for neuropathy. Everything is the same since last visit. She is having more trouble with neuropathy in her feet and hands. The Cymbalta 60mg  does help somewhat. She used to get swelling with Gabapentin. The pain is bad during the day and night. Pain is improved to 4. She cannot feel her two little toes. Discussed Lyrica as an option. She would like try the Sprint PNS. Her back pain is the most severe. She has hardware. She follows with Dr. Bonnita Nasuti at the end of the month. The pain in her back feels like a dagger. The pain is more directly on the spine.   Has recently been falling again Neuropathy has her feet so severe that she cannot feel her lower extremities, she notes the neuropathy is worsening, but she had excellent relief from Qutenza for 2 weeks after first application. After the most recent application she has had severe burning pain around her ankles and along the 5th digit. It has kept her up at night and she was not able to enjoy her 76th birthday party. The Valium has not been enough to control this pain. She asks for something stronger. The Cymbalta helps her upper extremity neuropathy but not her lower extremity neuropathy. Her Valium needs to be refilled. She asks if the dose can be increased. She does not take every day. The Valium helps with her pain as well.  She asks if we can go higher on the Topamax.   The Amitriptyline helps her to sleep a little better. She has improved sleep from 1 to 4 hours. She is having severe neuropathy in her left foot. The Cymbalta is helping with the neuropathic pain in her hands. She has been tolerating this medication well.   Last visit I transitioned her to outaptient therapy and she has an appointment today. She has noted improvements in  her strength. She is ambulating steadily with RW today. She has bene trying using a cane with therapy. Outpatient therapy is doing well. She has been taking 1/2 a tablet of hydrocodone to help her tolerate better (prescribed by her surgeon).   Pain is currently 5/10 and worst in her lower back where her spinal harware is present as well as in paraspinals. No longer acquiring She has been taking approximately 1 percocet per day and would like to gradually stop using these. She has also been using four 650mg  of Tylenol per day and two 200mg  ibuprofen daily.   She no longer has severe diarrhea or constipation and is much happier about this. It does come and go. She takes 1000mg  magensium at night. She takes colace BID. She likes to eat healthy foods.   Her daughter accompanies her this visit and helps to provide history. She and her daughter have been out of work for 5 years due to her health conditions and need for her daughter's assistance. Her grandchild recently graduated kindergarten.   1) B/l lower extremity edema She continues to have mild bilateral lower extremity edema.  Discussed use of compression garments, elevation, and ice.  Discussed the benefits of lymphedema therapy and she is willing to try this.  -She has been having pooling of fluids throughout her body -She has never seen nephrology but has followed with cardiology -Lasix administration is  limited due to her creatinine.  -improved after Qutenza administration on Friday  2) Bilateral knees: -she has fallen on her knees so many times.  -she has been using blue emu oil. -she has been using voltaren gel.  -she has a history of arthroscopic surgery and steroid injections -she uses turmeric  3) Muscle spasms -the Valium does help  4) Peripheral neuropathy: -still severe -had 2 falls in 6 hours -60mg  Cymbalta is helping. -improved after administration of Qutenza on Friday -she did have some burning on ankle and shin after  Qutenza and this has improved after she used the cooling gel -she loves the pain journal and keeps it by her -asks about the topamax -it is really affecting her quality of life  5) Prediabetes -has been eating very healthy foods  6) Anxiety: -they Valium helps- she uses as needed.  -she appreciates her husband's and daughter's care.   5) Dizziness -feels related to going out, anxiety.  6) obesity -has been trying to lose weight  7) Norovirus: -she was nauseous that she was falling -she was so dizzy and dehydrated  8) Orthostatic hypotension -[place] where she is on single floor  9) Gastroparesis: -saw GI on Tuesday -she was hospitalized  -she has lost 40 lbs -she took ozempic  10) s/p severe norovirus: -she was near sepsis  11) Hammer toe -she developed recently  Pain Inventory Average Pain 6 Pain Right Now 7 My pain is constant and aching  In the last 24 hours, has pain interfered with the following? General activity 7 Relation with others 7 Enjoyment of life 7 What TIME of day is your pain at its worst?  varies Sleep (in general) Poor  Pain is worse with: walking, bending, sitting and standing Pain improves with: rest, heat/ice, therapy/exercise and medication Relief from Meds: 2   Evening and night     Family History  Problem Relation Age of Onset   Atrial fibrillation Mother    Ulcers Father    Breast cancer Maternal Aunt    Lung cancer Maternal Uncle    Stomach cancer Maternal Grandmother    Lung cancer Maternal Aunt    Brain cancer Maternal Aunt    Prostate cancer Maternal Grandfather    Stroke Paternal Grandmother    Tuberculosis Paternal Grandfather    Colon cancer Neg Hx    Esophageal cancer Neg Hx    Rectal cancer Neg Hx    Social History   Socioeconomic History   Marital status: Married    Spouse name: robert   Number of children: 3   Years of education: college   Highest education level: Not on file   Occupational History   Occupation: retired  Tobacco Use   Smoking status: Former    Current packs/day: 0.00    Average packs/day: 0.5 packs/day for 35.0 years (17.5 ttl pk-yrs)    Types: Cigarettes    Start date: 05/23/1983    Quit date: 05/22/2018    Years since quitting: 4.8   Smokeless tobacco: Never  Vaping Use   Vaping status: Never Used  Substance and Sexual Activity   Alcohol use: Yes    Comment: 1 glass of wine a night   Drug use: Not Currently    Comment: CBD tincture   Sexual activity: Yes    Birth control/protection: Post-menopausal  Other Topics Concern   Not on file  Social History Narrative   Lives with husband in a 2 story home.  Has  2 living children.  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  Education: college.    Right-handed.   Five cups coffee per week.   Social Drivers of Corporate investment banker Strain: Low Risk  (03/18/2022)   Overall Financial Resource Strain (CARDIA)    Difficulty of Paying Living Expenses: Not hard at all  Food Insecurity: No Food Insecurity (07/18/2022)   Hunger Vital Sign    Worried About Running Out of Food in the Last Year: Never true    Ran Out of Food in the Last Year: Never true  Transportation Needs: No Transportation Needs (07/18/2022)   PRAPARE - Administrator, Civil Service (Medical): No    Lack of Transportation (Non-Medical): No  Physical Activity: Inactive (03/18/2022)   Exercise Vital Sign    Days of Exercise per Week: 0 days    Minutes of Exercise per Session: 0 min  Stress: No Stress Concern Present (03/18/2022)   [employer] of Occupational Health - Occupational Stress Questionnaire    Feeling of Stress : Only a little  Social Connections: Not on file   Past Surgical History:  Procedure Laterality Date   ABDOMINAL HYSTERECTOMY  1985   BASAL CELL CARCINOMA EXCISION  10/15   nose   BIOPSY  07/22/2022   Procedure: BIOPSY;  Surgeon: Meryl Dare, MD;  Location: [facility];  Service:  Gastroenterology;;   BREAST BIOPSY Right 08/24/2015   BREAST BIOPSY Right 07/13/2015   BREAST BIOPSY Left 07/13/2015   BREAST EXCISIONAL BIOPSY Left 1972   BREAST LUMPECTOMY Left    BREAST LUMPECTOMY WITH RADIOACTIVE SEED AND SENTINEL LYMPH NODE BIOPSY Left 09/16/2015   Procedure: BREAST LUMPECTOMY WITH RADIOACTIVE SEED AND SENTINEL LYMPH NODE BIOPSY;  Surgeon: Almond Lint, MD;  Location: [facility];  Service: General;  Laterality: Left;   BREAST REDUCTION SURGERY Bilateral 1998   CATARACT EXTRACTION W/ INTRAOCULAR LENS IMPLANT Left 2016   CERVICAL SPINE SURGERY  1995   hallo   COLONOSCOPY  01/2013   polyps   ESOPHAGOGASTRODUODENOSCOPY N/A 07/22/2022   Procedure: ESOPHAGOGASTRODUODENOSCOPY (EGD);  Surgeon: Meryl Dare, MD;  Location: [facility];  Service: Gastroenterology;  Laterality: N/A;   EYE SURGERY Left 2016   HAND SURGERY Left 02-19-2002   dr Merlyn Lot  @[facility]    repair collateral ligament/ MPJ left thumb   HARDWARE REMOVAL N/A 07/24/2019   Procedure: REVISION OF HARDWARE Lumbar two - Sacral one. Extention of Fusion to Lumbar one;  Surgeon: [name], MD;  Location: [facility];  Service: Neurosurgery;  Laterality: N/A;   HEMORRHOID SURGERY  2008   HIGH RESOLUTION ANOSCOPY N/A 02/28/2018   Procedure: HIGH RESOLUTION ANOSCOPY WITH BIOPSY;  Surgeon: Romie Levee, MD;  Location: [facility];  Service: General;  Laterality: N/A;   KNEE ARTHROSCOPY Right 2002   LUMBAR SPINE SURGERY  03-20-2015   dr Conchita Paris   fusion L4-5, L5-S1   MOUTH SURGERY  07/14/2018   2 infected teeth removed   MULTIPLE TOOTH EXTRACTIONS     with bone grafting lower bottom right and left   REDUCTION MAMMAPLASTY Bilateral    RIGHT COLECTOMY  09-10-2003   dr Jamey Ripa @[facility]    multiple colon polyps (per path tubular adenoma's, hyperplastic , benign appendix, benign two lymph nodes   ROTATOR CUFF REPAIR Left 04/2016   TONSILLECTOMY  1969   TUBAL LIGATION Bilateral 1982   Past  Medical History:  Diagnosis Date   AIN (anal intraepithelial neoplasia) anal canal  Anemia    with pregnancy   Anxiety    Arthritis    knees, lower back, knees   Chronic constipation    Chronic diastolic CHF (congestive heart failure) (HCC) 07/05/2019   CKD (chronic kidney disease)    Coarse tremors    Essential   COPD (chronic obstructive pulmonary disease) (HCC)    2017 chest CT, history of   Degenerative lumbar spinal stenosis    Depression    Depression 07/05/2019   Drug dependency (HCC)    Elevated PTHrP level 05/09/2019   EtOH use 07/05/2019   Pt denies heavy drinking however   Gastroparesis    GERD (gastroesophageal reflux disease)    pt denies   Gout    Grade I diastolic dysfunction 02/01/2016   Noted ECHO   Hemorrhoids    History of adenomatous polyp of colon    History of basal cell carcinoma (BCC) excision 2015   nose   History of sepsis 05/14/2014   post lumbar surgery   History of shingles    x2   Hyperlipidemia    Hypertension    Hypothyroidism    Irregular heart beat    history of while undergoing radiation   Malignant neoplasm of upper-inner quadrant of left breast in female, estrogen receptor positive [facility]) oncologist-  dr Darnelle Catalan--  per lov in epic no recurrence   dx 07-13-2015  Left breast invasive ductal carcinoma, Stage IA, Grade 1 (TXN0),  09-16-2015  s/p  left breast lumpectomy with sln bx's,  completed radiation 11-18-2015,  started antiestrogen therapy 12-14-2015   Neuropathy    Left foot and back of left leg   Obesity (BMI 30-39.9) 07/05/2019   Personal history of radiation therapy    completed 11-18-2015  left breast   Pneumonia    PONV (postoperative nausea and vomiting)    Prediabetes    diet controlled, no med   Restless leg syndrome    Seasonal allergies    Seizure (HCC) 05/2015   due to sepsis, only one time episode   Tremor    Wears partial dentures    lower    Ht 5\' 5"  (1.651 m)   Wt 156 lb 12.8 oz (71.1 kg)   BMI  26.09 kg/m   Opioid Risk Score:   Fall Risk Score:  `1  Depression screen [facility] 2/9     04/07/2023   10:24 AM 11/03/2022   10:36 AM 07/14/2022   11:02 AM 05/17/2022    2:28 PM 03/18/2022   11:40 AM 11/19/2021    1:40 PM 08/25/2021   10:32 AM  Depression screen PHQ 2/9  Decreased Interest 0 0 0 0 0 0 0  Down, Depressed, Hopeless 0 0 0 0 0 0 0  PHQ - 2 Score 0 0 0 0 0 0 0  Altered sleeping     1 0   Tired, decreased energy     1 1   Change in appetite     0 0   Feeling bad or failure about yourself      0 0   Trouble concentrating     0 0   Moving slowly or fidgety/restless     0 0   Suicidal thoughts     0 0   PHQ-9 Score     2 1   Difficult doing work/chores     Not difficult at all Not difficult at all     Review of Systems  Constitutional: Negative.   HENT: Negative.  Eyes: Negative.   Respiratory: Negative.    Cardiovascular: Negative.   Gastrointestinal: Negative.   Endocrine: Negative.   Genitourinary: Negative.   Musculoskeletal:  Positive for gait problem.  Skin: Negative.   Allergic/Immunologic: Negative.   Neurological:  Positive for dizziness, tremors, weakness and numbness.       Tingling   Hematological: Negative.   Psychiatric/Behavioral: Negative.    All other systems reviewed and are negative.      Objective:   Physical Exam Gen: no distress, normal appearing HEENT: oral mucosa pink and moist, NCAT Cardio: Reg rate Chest: normal effort, normal rate of breathing Abd: soft, non-distended Ext: no edema Psych: pleasant, normal affect Skin: intact Neuro: Alert and oriented x3  Assessment & Plan:  Mrs. Yurko is a 77 year old female who presents for f/u of neuropathy and below conditions.   1) Diarrhea: This was a huge issue previously and has since improved. I had obtained an XR that showed constipation and recommended an enema. Continue magnesium 1000mg  at night and colace BID. This continues but is improved.  -recommended shredded  coconut  2) Impaired mobility and ADLs following spinal surgery: -Continue HEP Progressing well. Increase walking to 10 minutes per day -Recommended exercise bike at home to build back endurance. Continue walking outside as well as strength and gait are improving and she is no longer limited by diarrhea. -She has been having a lot of falls at home  3) Post-operative lower back pain: -Stopped oxycodone- was prescribed 1/2 pill hydrocodone by her her spine surgery.  -Continue to supplement with icing, Tyelnol, and Ibuprofen.  -recommended topamax recommended to clear with cardiology first  4) Peripheral neuropathy: -Discussed Sprint PNS system as an option of pain treatment via neuromodulation. Provided following link for patient to learn more about the system: https://www.sprtherapeutics.com/. She would like to try this. Will target sciatic nerve at the popliteal fossa.  -refilled amitriptyline -will defer Qutenza -next time will only use 4 patches given increase in pain with 6 patches -will prescribe 5-7 day course of prn Norco to help with her current severe pain from the Qutenza application -will increase her Amitriptyline to 100mg .  -discussed that symptoms should abate soon, and to please keep me updated.    -discussed that pain has been severe -discussed that she prefers not to add any medication  Great benefit- schedule for the rest of the year every 3 months Continue keeping pain journal Increase topamax to 75mg  HS  5) Anixety: refilled Valium to 10mg  daily prn. Provided refill. Has been taking twice per day and this has been helping her. Also helps with her muscle spasms. This has been well controlled. She is sometimes upset that she is only comfortably horizontally.  -discussed that she is taking the Valium about once per week.  -continue cymbalta and amitritpyline  6) Tachycardia: provided higher dose of metoprolol which she was on before last visit. HR is still slightly  elevated today, but BP is low. Maintain current dose. She says BP was elevated at her doctor's appointment yesterday. It fluctuates.   7) Lower extremity edema: advised elevation, ice, and compression garments which she is already doing.  -continue Lasix -start lymphedema therapy -continue Qutenza q83months  8) Hot flashes: from Anastrazole.   Home support: Living with husband. Daughter lives 5 miles away.   9) Bilateral knee pain: -continue voltaren gel and blue emu oil -recommended bacopa -continue turmeric   10) Obesity -commended on weight loss! -Educated regarding health benefits of weight  loss- for pain, general health, chronic disease prevention, immune health, mental health.  -Will monitor weight every visit.  -Consider Roobois tea daily.  -Discussed the benefits of intermittent fasting. -Discussed foods that can assist in weight loss: 1) leafy greens- high in fiber and nutrients 2) dark chocolate- improves metabolism (if prefer sweetened, best to sweeten with honey instead of sugar).  3) cruciferous vegetables- high in fiber and protein 4) full fat yogurt: high in healthy fat, protein, calcium, and probiotics 5) apples- high in a variety of phytochemicals 6) nuts- high in fiber and protein that increase feelings of fullness 7) grapefruit: rich in nutrients, antioxidants, and fiber (not to be taken with anticoagulation) 8) beans- high in protein and fiber 9) salmon- has high quality protein and healthy fats 10) green tea- rich in polyphenols 11) eggs- rich in choline and vitamin D 12) tuna- high protein, boosts metabolism 13) avocado- decreases visceral abdominal fat 14) chicken (pasture raised): high in protein and iron 15) blueberries- reduce abdominal fat and cholesterol 16) whole grains- decreases calories retained during digestion, speeds metabolism 17) chia seeds- curb appetite 18) chilies- increases fat metabolism  -Discussed supplements that can be used:  1)  Metatrim 400mg  BID 30 minutes before breakfast and dinner  2) Sphaeranthus indicus and Garcinia mangostana (combinations of these and #1 can be found in capsicum and zychrome  3) green coffee bean extract 400mg  twice per day or Irvingia (african mango) 150 to 300mg  twice per day.  11) Onchomycosis: -prescribed Miconazole BID  12) Orthostatic hypotension: -recently diagnosed -continue midodrine -encouraged drinking water with a pinch of salt  13) Norovirus -discussed recent hospitalization.  -discussed that she is on Protonix for 2 weeks -discussed that she still has nausea and upset stomach on occasion -discussed that she was near sepsis  14) Hypoglycemia: -discussed that blood sugar decreased to 50s  15) Impaired mobility and ADLs: -continue home health -discussed increasing walking daily and doing resistance exercise  16) Hypotension -discussed that she does feel dizzy -discussed that she is being checked for adrenal insufficiency  -discussed ted hose and that she has tried many kinds and getting them on is tough  17) Bilateral edema: -discussed that this has improved.   18) Gastroparesis: -discussed that she tolerated the salad, black bean soup, lentil soup -encouraged miso-ginger  19) Left shoulder pain:  -discussed heating pad Discussed extracorporeal shockwave therapy as a modality for treatment. Discussed that the device looks and feels like a massage gun and I would move it over the area of pain for about 10 minutes. The device releases sound waves to the area of pain and helps to improve blood flow and circulation to improve the healing process. Discuss that this initially induces inflammation and can sometimes cause short-term increase in pain. Discussed that we typically do three weekly treatments, but sometimes up to 6 if needed, and after 6 weeks long term benefits can sometimes be achieved. Discussed that this is an FDA approved device, but not covered by  insurance and would cost $60 per session. Will scheduled patient for 6 consecutive appointments and can cancel latter three if benefits are achieved after first three sessions.    20. Hammer toe: -discussed that she saw podiatry  21. Insomnia: -Try to go outside near sunrise -Get exercise during the day.  -Turn off all devices an hour before bedtime.  -Teas that can benefit: chamomile, valerian root, Brahmi (Bacopa) -Can consider over the counter melatonin, magnesium, and/or L-theanine. Melatonin is an anti-oxidant with  multiple health benefits. Magnesium is involved in greater than 300 enzymatic reactions in the body and most of [place] are deficient as our soil is often depleted. There are 7 different types of magnesium- Bioptemizer's is a supplement with all 7 types, and each has unique benefits. Magnesium can also help with constipation and anxiety.  -Pistachios naturally increase the production of melatonin -Cozy Earth bamboo bed sheets are free from toxic chemicals.  -Tart cherry juice or a tart cherry supplement can improve sleep and soreness post-workout  22. T12 compression fracture: -meloxicam daily prn

## 2023-04-07 NOTE — Addendum Note (Signed)
Addended by: Horton Chin on: 04/07/2023 10:56 AM   Modules accepted: Orders

## 2023-04-10 ENCOUNTER — Ambulatory Visit: Payer: Medicare HMO | Admitting: Internal Medicine

## 2023-04-10 ENCOUNTER — Other Ambulatory Visit: Payer: Self-pay

## 2023-04-10 VITALS — BP 118/80 | HR 112 | Temp 97.5°F | Wt 156.0 lb

## 2023-04-10 DIAGNOSIS — W010XXA Fall on same level from slipping, tripping and stumbling without subsequent striking against object, initial encounter: Secondary | ICD-10-CM | POA: Diagnosis not present

## 2023-04-10 DIAGNOSIS — M4626 Osteomyelitis of vertebra, lumbar region: Secondary | ICD-10-CM

## 2023-04-10 DIAGNOSIS — Z79899 Other long term (current) drug therapy: Secondary | ICD-10-CM

## 2023-04-10 DIAGNOSIS — M549 Dorsalgia, unspecified: Secondary | ICD-10-CM

## 2023-04-10 NOTE — Progress Notes (Signed)
Patient ID: Brandi Stephens, female   DOB: 07-11-1945, 77 y.o.   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 Doing well up until 8-9 wks ago, when she fell, tripped. Fell on coccyx. Had xray nd back and left shoulder. But found to have T12 fracture. Saw orthopedics surgeon in area in [facility] area.   She was done by dr Brandi Stephens, which she hasn't seen yet./ sees orthopedist -December 31st.   Outpatient Encounter Medications as of 04/10/2023  Medication Sig   allopurinol (ZYLOPRIM) 300 MG tablet Take 300 mg by mouth daily.   Biotin 3 MG TABS Take 3 mg by mouth daily.   cefadroxil (DURICEF) 500 MG capsule Take 1 capsule (500 mg total) by mouth 2 (two) times daily.   Cholecalciferol (VITAMIN D3) 50 MCG (2000 UT) capsule Take 1 capsule (2,000 Units total) by mouth daily.   colchicine 0.6 MG tablet Take 2 tablets at once then take 1 tablet an hour later then then 1 tablet twice daily x 3 days.   diazepam (VALIUM) 10 MG tablet Take 1 tablet (10 mg total) by mouth daily as needed for anxiety.   levothyroxine (SYNTHROID) 75 MCG tablet Take 1 tablet (75 mcg total) by mouth daily.   meloxicam (MOBIC) 7.5 MG tablet Take 1 tablet (7.5 mg total) by mouth daily as needed for pain.   metoCLOPramide (REGLAN) 5 MG tablet Take 1 tablet (5 mg total) by mouth 3 (three) times daily before meals.   pantoprazole (PROTONIX) 40 MG tablet Take 1 tablet (40 mg total) by mouth 2 (two) times daily.   polyethylene glycol powder (GLYCOLAX/MIRALAX) 17 GM/SCOOP powder Take 17 g by mouth daily as needed for moderate constipation.   Propylene Glycol (SYSTANE BALANCE OP) Place 1 drop into both eyes 4 (four) times daily as needed (dry eyes).   rOPINIRole (REQUIP) 1 MG tablet TAKE 1 TABLET(1 MG) BY MOUTH AT BEDTIME   tiZANidine (ZANAFLEX) 4 MG tablet Take 1 tablet (4 mg total) by mouth every 6 (six)  hours as needed for muscle spasms.   topiramate (TOPAMAX) 100 MG tablet Take 1 tablet (100 mg total) by mouth at bedtime.   traMADol (ULTRAM) 50 MG tablet Take 1 tablet (50 mg total) by mouth daily as needed.   No facility-administered encounter medications on file as of 04/10/2023.     Patient Active Problem List   Diagnosis Date Noted   Myalgia due to statin 02/13/2023   Medication adverse effect, sequela 02/13/2023   Hypomagnesemia 11/03/2022   Loss of weight 07/22/2022   Gastroesophageal reflux disease 07/22/2022   UTI (urinary tract infection) 07/19/2022   Severe sepsis with acute organ dysfunction (HCC) 06/20/2022   Enlarged and hypertrophic nails 04/07/2022   Dizziness 04/07/2022   Postmenopausal 04/07/2022   Mixed dyslipidemia 04/07/2022   Estrogen deficiency 04/07/2022   Autoimmune disease (HCC) 04/07/2022   Edema 04/07/2022   Chronic venous insufficiency 04/07/2022   Polypharmacy 01/05/2022   High risk medication use 01/05/2022   Type 2 diabetes mellitus (HCC) 11/19/2021   Frequent falls 08/05/2021   Elevated uric acid in blood 08/05/2021   Vitamin deficiency 08/05/2021   Chronic hypoxemic respiratory failure (HCC) 08/05/2021   Type II diabetes mellitus with complication (HCC) 08/05/2021   Other chronic pain 08/05/2021   Vaccine counseling 08/05/2021   Hypoxemia 07/02/2021   Morbid obesity (HCC)  Short-term memory loss 02/24/2020   Osteopenia 01/22/2020   Aortic atherosclerosis (HCC) 01/22/2020   Insomnia 10/29/2019   Recurrent falls 09/06/2019   Confusion, postoperative    Chronic constipation    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   Muscle spasm    Hypokalemia    Status post lumbar spinal fusion 07/10/2019   Postoperative pain    Labile blood pressure    Hypoalbuminemia due to protein-calorie malnutrition (HCC)    Anxiety 07/05/2019   Depression 07/05/2019   Chronic diastolic CHF (congestive heart failure) (HCC) 07/05/2019   Obesity (BMI 30-39.9)  07/05/2019   Lumbar radiculopathy 06/28/2019   Spinal stenosis of lumbar region 06/28/2019   Tremors of nervous system    Chronic pain syndrome    Osteomyelitis of lumbar spine (HCC) 06/25/2019   Muscular deconditioning    Nausea and vomiting 06/24/2019   History of sepsis 06/24/2019   Recent major surgery 06/24/2019   History of diverticulosis 06/24/2019   Elevated PTHrP level 05/09/2019   Low back pain 02/12/2019   Neuropathy 12/20/2018   Lumbar spinal stenosis 06/29/2018   Abnormal CT of the chest 06/04/2018   Hypothyroidism 11/30/2017   Thoracic aortic atherosclerosis (HCC) 09/14/2016   RLS (restless legs syndrome) 09/09/2016   H/O adenomatous polyp of colon 07/06/2016   PVC's (premature ventricular contractions) 03/29/2016   Grade I diastolic dysfunction 02/01/2016   Former cigarette smoker 01/11/2016   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   Leg edema 01/11/2016   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   History of left breast cancer 07/14/2015   AKI (acute kidney injury) (HCC) 07/08/2015   Dependent edema 06/08/2015   Sleep disturbance 06/08/2015   Mixed hyperlipidemia 06/08/2015   Generalized anxiety disorder 06/08/2015   Essential hypertension 06/08/2015   COPD (chronic obstructive pulmonary disease) (HCC) 06/08/2015   Gout 06/08/2015   Macrocytic anemia    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HCC)    Lumbar spondylosis 03/20/2015     Health Maintenance Due  Topic Date Due   COVID-19 Vaccine (3 - Pfizer risk series) 11/19/2019   OPHTHALMOLOGY EXAM  11/09/2022   Medicare Annual Wellness (AWV)  03/19/2023   FOOT EXAM  04/08/2023     Review of Systems 12 point ros has been reviewed. Positive pertinents in hpi Physical Exam   BP 118/80   Pulse (!) 112   Temp (!) 97.5 F (36.4 C) (Temporal)   Wt 156 lb (70.8 kg)   BMI 25.96 kg/m   Physical Exam  Constitutional:  oriented to person, place, and time. appears well-developed and well-nourished. No  distress.  HENT: Friendly/AT, PERRLA, no scleral icterus Mouth/Throat: Oropharynx is clear and moist. No oropharyngeal exudate.  Cardiovascular: Normal rate, regular rhythm and normal heart sounds. Exam reveals no gallop and no friction rub.  No murmur heard.  Pulmonary/Chest: Effort normal and breath sounds normal. No respiratory distress.  has no wheezes.  Neck = supple, no nuchal rigidity Lymphadenopathy: no cervical adenopathy. No axillary adenopathy Neurological: alert and oriented to person, place, and time.  Skin: Skin is warm and dry. No rash noted. No erythema.  Psychiatric: a normal mood and affect.  behavior is normal.   Lab Results  Component Value Date   LABRPR NON REACTIVE 07/20/2022    CBC Lab Results  Component Value Date   WBC 7.1 11/03/2022   RBC 4.67 11/03/2022   HGB 14.4 11/03/2022   HCT 44.4 11/03/2022   PLT 302 11/03/2022   MCV 95 11/03/2022   MCH 30.8 11/03/2022   MCHC  32.4 11/03/2022   RDW 13.2 11/03/2022   LYMPHSABS 1.5 06/20/2022   MONOABS 0.8 06/20/2022   EOSABS 0.3 06/20/2022    BMET Lab Results  Component Value Date   NA 139 02/13/2023   K 4.5 02/13/2023   CL 103 02/13/2023   CO2 23 02/13/2023   GLUCOSE 96 02/13/2023   BUN 15 02/13/2023   CREATININE 1.00 02/13/2023   CALCIUM 10.4 (H) 02/13/2023   GFRNONAA >60 07/23/2022   GFRAA 52 (L) 06/04/2020    Lab Results  Component Value Date   ESRSEDRATE 6 04/10/2023    Lab Results  Component Value Date   CRP 6.9 04/10/2023     Assessment and Plan  Severe back pain = had recent ground level fall found to have T12 fracture. Unclear if HW loosened ? May need extension of fusion to stabilize T12. Suspect that T12 fracture is not necessarily infection that is weakening the vertebrae but rather had falls that caused the injury.  Will check sed rate and crp and cbc and bmp to ensure no untoward effects from long term abtx  Will check kidney function Will continue on cefadroxil. Recommend to  have her see nundkumar.  I have personally spent 33 minutes involved in face-to-face and non-face-to-face activities for this patient on the day of the visit. Professional time spent includes the following activities: Preparing to see the patient (review of tests), Obtaining and/or reviewing separately obtained history (admission/discharge record), Performing a medically appropriate examination and/or evaluation , Ordering medications/tests/procedures, referring and communicating with other health care professionals, Documenting clinical information in the EMR, Independently interpreting results (not separately reported), Communicating results to the patient/family/caregiver, Counseling and educating the patient/family/caregiver and Care coordination (not separately reported).

## 2023-04-11 LAB — CBC WITH DIFFERENTIAL/PLATELET
Absolute Lymphocytes: 1183 {cells}/uL (ref 850–3900)
Absolute Monocytes: 440 {cells}/uL (ref 200–950)
Basophils Absolute: 61 {cells}/uL (ref 0–200)
Basophils Relative: 1.1 %
Eosinophils Absolute: 253 {cells}/uL (ref 15–500)
Eosinophils Relative: 4.6 %
HCT: 42.7 % (ref 35.0–45.0)
Hemoglobin: 13.9 g/dL (ref 11.7–15.5)
MCH: 31.7 pg (ref 27.0–33.0)
MCHC: 32.6 g/dL (ref 32.0–36.0)
MCV: 97.3 fL (ref 80.0–100.0)
MPV: 11.5 fL (ref 7.5–12.5)
Monocytes Relative: 8 %
Neutro Abs: 3564 {cells}/uL (ref 1500–7800)
Neutrophils Relative %: 64.8 %
Platelets: 226 10*3/uL (ref 140–400)
RBC: 4.39 10*6/uL (ref 3.80–5.10)
RDW: 11.8 % (ref 11.0–15.0)
Total Lymphocyte: 21.5 %
WBC: 5.5 10*3/uL (ref 3.8–10.8)

## 2023-04-11 LAB — BASIC METABOLIC PANEL
BUN/Creatinine Ratio: 21 (calc) (ref 6–22)
BUN: 22 mg/dL (ref 7–25)
CO2: 24 mmol/L (ref 20–32)
Calcium: 10.7 mg/dL — ABNORMAL HIGH (ref 8.6–10.4)
Chloride: 109 mmol/L (ref 98–110)
Creat: 1.06 mg/dL — ABNORMAL HIGH (ref 0.60–1.00)
Glucose, Bld: 96 mg/dL (ref 65–99)
Potassium: 4.1 mmol/L (ref 3.5–5.3)
Sodium: 144 mmol/L (ref 135–146)

## 2023-04-11 LAB — C-REACTIVE PROTEIN: CRP: 6.9 mg/L (ref <8.0)

## 2023-04-11 LAB — SEDIMENTATION RATE: Sed Rate: 6 mm/h (ref 0–30)

## 2023-04-17 ENCOUNTER — Ambulatory Visit (INDEPENDENT_AMBULATORY_CARE_PROVIDER_SITE_OTHER): Payer: Medicare HMO | Admitting: Medical

## 2023-04-17 ENCOUNTER — Ambulatory Visit
Admission: RE | Admit: 2023-04-17 | Discharge: 2023-04-17 | Disposition: A | Discharge status: Home / Self Care | Payer: Medicare HMO | Source: Ambulatory Visit | Attending: Medical | Admitting: Medical

## 2023-04-17 ENCOUNTER — Telehealth: Payer: Self-pay

## 2023-04-17 VITALS — BP 110/80 | HR 118 | Wt 156.0 lb

## 2023-04-17 DIAGNOSIS — M25552 Pain in left hip: Secondary | ICD-10-CM

## 2023-04-17 DIAGNOSIS — M62838 Other muscle spasm: Secondary | ICD-10-CM | POA: Diagnosis not present

## 2023-04-17 DIAGNOSIS — G894 Chronic pain syndrome: Secondary | ICD-10-CM | POA: Diagnosis not present

## 2023-04-17 DIAGNOSIS — M545 Low back pain, unspecified: Secondary | ICD-10-CM

## 2023-04-17 DIAGNOSIS — G629 Polyneuropathy, unspecified: Secondary | ICD-10-CM | POA: Diagnosis not present

## 2023-04-17 DIAGNOSIS — I10 Essential (primary) hypertension: Secondary | ICD-10-CM

## 2023-04-17 DIAGNOSIS — M47816 Spondylosis without myelopathy or radiculopathy, lumbar region: Secondary | ICD-10-CM | POA: Diagnosis not present

## 2023-04-17 DIAGNOSIS — S22080G Wedge compression fracture of T11-T12 vertebra, subsequent encounter for fracture with delayed healing: Secondary | ICD-10-CM

## 2023-04-17 DIAGNOSIS — Z981 Arthrodesis status: Secondary | ICD-10-CM | POA: Diagnosis not present

## 2023-04-17 DIAGNOSIS — M48061 Spinal stenosis, lumbar region without neurogenic claudication: Secondary | ICD-10-CM | POA: Diagnosis not present

## 2023-04-17 DIAGNOSIS — E038 Other specified hypothyroidism: Secondary | ICD-10-CM | POA: Diagnosis not present

## 2023-04-17 NOTE — Telephone Encounter (Signed)
Brandi Stephens has seen her PCP and would like to discuss what medication you will take over prescribing.   Call back phone [phone]  (Patient is aware Dr. Carlis Abbott is not in the office this week).

## 2023-04-17 NOTE — Telephone Encounter (Signed)
Hello Dr. Carlis Abbott I saw our mutual patient today.  She is complaining of severe pain in her back and left pelvic area, shoulder and the spasms.  I recommended that she only be seeing 1 person to manage her pain medicine pain control.  She was wanting me to refill her tramadol and tizanidine today.  Now that she has established care with you, I told her that she should really have her pain treatments addressed by you only and not getting some refills here and some there.  Also wanted to let you know that I reviewed your recent note from 04/07/2023.  She apparently is not taking some of the medication as listed in your notes.  For example I do not think she has been taking amitriptyline or Cymbalta for a while.  She apparently has never started the Qutenza patch.  She thinks the prescription may be at the pharmacy but she is actually not started this before according to her  She has noted benefit in the past with tizanidine and Valium at different times a day.  I advised that she probably should only just be on 1 such as Valium and not tizanidine and Valium at the same time.  I will defer this to you.  She seems to get benefit from tramadol but I advised that she needs to go through you for pain management in general, so I did not refill this today.  She had an unusual left lower pelvic pain today so I am getting an x-ray of this.  I also referred her back to neurosurgery regarding her ongoing back issues, history of compression fracture and per infectious disease recommendation as well.  She sees orthopedics tomorrow, Dr. Cleophas Dunker at [facility] orthopedics.  I think this is mainly for the shoulder  She had a pretty bad fall in November 2024  Thanks Kristian Covey, PA-C

## 2023-04-17 NOTE — Progress Notes (Signed)
Subjective: Chief Complaint  Patient presents with   Medical Management of Chronic Issues    Med check. Having extreme pain due to pain- needs a referral to a neurosurgeron.     Patient Care Team: Mazikeen Hehn, Kermit Balo, PA-C as PCP - General (Family Medicine) Rennis Golden Lisette Abu, MD as PCP - Cardiology (Cardiology) Magrinat, Valentino Hue, MD as Consulting Physician (Oncology) Almond Lint, MD as Consulting Physician (General Surgery) Ronnald Nian, MD as Consulting Physician (Family Medicine) [name], MD as Consulting Physician (Neurosurgery) Mathews Robinsons, MD as Consulting Physician (Dermatology) Axel Filler, Larna Daughters, NP as Nurse Practitioner (Hematology and Oncology) Teryl Lucy, MD as Consulting Physician (Orthopedic Surgery) Odette Fraction, MD (Inactive) as Consulting Physician (Anesthesiology) Armbruster, Willaim Rayas, MD as Consulting Physician (Gastroenterology) [name], MD as Consulting Physician (Neurosurgery) Levert Feinstein, MD as Consulting Physician (Neurology) Verner Chol, [facility] as Pharmacist (Pharmacist)   Brandi Stephens is a 77 yo female with past medical history of diastolic CHF, COPD on O2, CKD-3A, HTN, HLD  Her main concern today is pain.  She just recently saw infectious disease due to ongoing problems with osteomyelitis of lumbar spine and saw pain management both within the past week  However she still notes quite a bit of pain.  She has pain in her low back, and has some new pains in her left lower pelvis area this just started in the past week.  She needs a new referral to neurosurgery.  She called their office to try to get an appointment but needed a referral.  Regarding the new left lower pain or pelvic pain, she denies any changes to her new findings regarding her urine or bowels.  No diarrhea.  No blood in the stool.  No urinary changes.  No fever.  No body aches or chills.  She is status post hysterectomy.  As far as she knows she still has her  ovaries.  No pain with left hip range of motion.  Having a lot of spasms in her low back.  She continues on antibiotics for osteomyelitis.  Sees infectious disease fairly regularly.  Hypertension-not currently on medication.  She has been off medicine for several months now given prior low blood pressure readings and lightheadedness and dizziness and falls.  She was even on midodrine back in November, but blood pressure seems to be stable of late.  She is checking home blood pressures and they have been fine in the last several weeks  Diabetes - not currently on medications.   She is checking sugars.  Home blood sugars are runing ok, typically in the 80s fasting.  She was on Ozempic earlier this year for diabetes control and weight loss but ended up not doing well on this after losing weight and getting gastroparesis issues.  She has been off this medicine for several months now and doing okay  Hypothyroidism-compliant with medication 75 mcg levothyroxine daily.  Regarding her medication, she was not sure who was going to continue her Ultram and tramadol and tizanidine.  Reviewing over her recent pain management notes there are some discrepancies.  She says she is not currently on amitriptyline or Cymbalta although the pain management notes specify that she is taking those.  She also says she has not started the pain patch that was prescribed by pain management  ROS as in subjective    Past Medical History:  Diagnosis Date   AIN (anal intraepithelial neoplasia) anal canal    Anemia    with pregnancy  Anxiety    Arthritis    knees, lower back, knees   Chronic constipation    Chronic diastolic CHF (congestive heart failure) (HCC) 07/05/2019   CKD (chronic kidney disease)    Coarse tremors    Essential   COPD (chronic obstructive pulmonary disease) (HCC)    2017 chest CT, history of   Degenerative lumbar spinal stenosis    Depression    Depression 07/05/2019   Drug dependency (HCC)     Elevated PTHrP level 05/09/2019   EtOH use 07/05/2019   Pt denies heavy drinking however   Gastroparesis    GERD (gastroesophageal reflux disease)    pt denies   Gout    Grade I diastolic dysfunction 02/01/2016   Noted ECHO   Hemorrhoids    History of adenomatous polyp of colon    History of basal cell carcinoma (BCC) excision 2015   nose   History of sepsis 05/14/2014   post lumbar surgery   History of shingles    x2   Hyperlipidemia    Hypertension    Hypothyroidism    Irregular heart beat    history of while undergoing radiation   Malignant neoplasm of upper-inner quadrant of left breast in female, estrogen receptor positive [facility]) oncologist-  dr Darnelle Catalan--  per lov in epic no recurrence   dx 07-13-2015  Left breast invasive ductal carcinoma, Stage IA, Grade 1 (TXN0),  09-16-2015  s/p  left breast lumpectomy with sln bx's,  completed radiation 11-18-2015,  started antiestrogen therapy 12-14-2015   Neuropathy    Left foot and back of left leg   Obesity (BMI 30-39.9) 07/05/2019   Personal history of radiation therapy    completed 11-18-2015  left breast   Pneumonia    PONV (postoperative nausea and vomiting)    Prediabetes    diet controlled, no med   Restless leg syndrome    Seasonal allergies    Seizure (HCC) 05/2015   due to sepsis, only one time episode   Tremor    Wears partial dentures    lower    Current Outpatient Medications on File Prior to Visit  Medication Sig Dispense Refill   allopurinol (ZYLOPRIM) 300 MG tablet Take 300 mg by mouth daily.     Biotin 3 MG TABS Take 3 mg by mouth daily.     cefadroxil (DURICEF) 500 MG capsule Take 1 capsule (500 mg total) by mouth 2 (two) times daily. 60 capsule 11   Cholecalciferol (VITAMIN D3) 50 MCG (2000 UT) capsule Take 1 capsule (2,000 Units total) by mouth daily. 90 capsule 3   colchicine 0.6 MG tablet Take 2 tablets at once then take 1 tablet an hour later then then 1 tablet twice daily x 3 days. 30 tablet 2    diazepam (VALIUM) 10 MG tablet Take 1 tablet (10 mg total) by mouth daily as needed for anxiety. 90 tablet 3   levothyroxine (SYNTHROID) 75 MCG tablet Take 1 tablet (75 mcg total) by mouth daily. 90 tablet 2   metoCLOPramide (REGLAN) 5 MG tablet Take 1 tablet (5 mg total) by mouth 3 (three) times daily before meals. 90 tablet 5   pantoprazole (PROTONIX) 40 MG tablet Take 1 tablet (40 mg total) by mouth 2 (two) times daily. 60 tablet 1   polyethylene glycol powder (GLYCOLAX/MIRALAX) 17 GM/SCOOP powder Take 17 g by mouth daily as needed for moderate constipation.     rOPINIRole (REQUIP) 1 MG tablet TAKE 1 TABLET(1 MG) BY MOUTH AT BEDTIME  90 tablet 0   tiZANidine (ZANAFLEX) 4 MG tablet Take 1 tablet (4 mg total) by mouth every 6 (six) hours as needed for muscle spasms. 30 tablet 0   topiramate (TOPAMAX) 100 MG tablet Take 1 tablet (100 mg total) by mouth at bedtime. 60 tablet 2   traMADol (ULTRAM) 50 MG tablet Take 1 tablet (50 mg total) by mouth daily as needed. 30 tablet 0   meloxicam (MOBIC) 7.5 MG tablet Take 1 tablet (7.5 mg total) by mouth daily as needed for pain. (Patient not taking: Reported on 04/17/2023) 30 tablet 3   Propylene Glycol (SYSTANE BALANCE OP) Place 1 drop into both eyes 4 (four) times daily as needed (dry eyes).     No current facility-administered medications on file prior to visit.     Objective: BP 110/80   Pulse (!) 118   Wt 156 lb (70.8 kg)   BMI 25.96 kg/m   BP Readings from Last 3 Encounters:  04/17/23 110/80  04/10/23 118/80  04/07/23 (!) 124/90   Wt Readings from Last 3 Encounters:  04/17/23 156 lb (70.8 kg)  04/10/23 156 lb (70.8 kg)  04/07/23 156 lb 12.8 oz (71.1 kg)   General: Seated, seemingly in pain, walker present Lungs clear no rhonchi or wheezes or rales Heart regular rate rhythm, normal S1-S2, no murmurs Abdomen: Tender left lower quadrant deep/anterior hip and pelvis anterior superior iliac spine region, otherwise nontender with no other  mass or obvious guarding or distention Tender throughout the lower lumbar spine Left leg nontender and left hip range of motion seems relatively full without notable pain Strength 4-5 out of 5 bilateral legs     Assessment: Encounter Diagnoses  Name Primary?   Lumbar spondylosis Yes   Spinal stenosis of lumbar region, unspecified whether neurogenic claudication present    Neuropathy    Low back pain, unspecified back pain laterality, unspecified chronicity, unspecified whether sciatica present    Compression fracture of T12 vertebra with delayed healing, subsequent encounter    Pelvic joint pain, left    Left hip pain    Chronic pain syndrome    Essential hypertension    Other specified hypothyroidism    Muscle spasm      Plan: We discussed her new left lower pelvic pain.  She does not have any new bowel or bladder symptoms or complaints.  I suspect this is related to her fall several weeks ago in mid November and ongoing pains in the lumbar spine and pelvis as well.  We will get pelvis and hip x-ray today.  Compression fracture, low back pain, spasm-referral back to neurosurgery  She has ongoing back shoulder and hip pains and pelvis pains since some recent falls in mid November.  She saw orthopedist down close to where she is living in [facility] but had a bad experience.  She is seeing [facility] here in [place] tomorrow.  She had recent x-rays already regarding shoulder pain, coccyx pain, low back pain.  Regarding chronic pain, spasm -she should really be getting pain control for 1 provider and not multiple providers.  I advised that she was also on both tizanidine and Valium and did not need to be on both necessarily.  I will reach out to her pain management specialist, Dr. Sula Soda, to help clarify and to make sure she is going to see provider for pain.  Advise she get future pain management treatment from her pain specialist  only  Hypothyroidism-continue current medication  Davena was seen today for medical management of chronic issues.  Diagnoses and all orders for this visit:  Lumbar spondylosis -     Ambulatory referral to Neurosurgery  Spinal stenosis of lumbar region, unspecified whether neurogenic claudication present -     Ambulatory referral to Neurosurgery  Neuropathy -     Ambulatory referral to Neurosurgery  Low back pain, unspecified back pain laterality, unspecified chronicity, unspecified whether sciatica present -     Ambulatory referral to Neurosurgery  Compression fracture of T12 vertebra with delayed healing, subsequent encounter -     Ambulatory referral to Neurosurgery  Pelvic joint pain, left -     DG Hip Unilat W OR W/O Pelvis 2-3 Views Left; Future  Left hip pain -     DG Hip Unilat W OR W/O Pelvis 2-3 Views Left; Future  Chronic pain syndrome  Essential hypertension  Other specified hypothyroidism  Muscle spasm     F/u pending x-rays and call back

## 2023-04-18 ENCOUNTER — Other Ambulatory Visit: Payer: Self-pay | Admitting: Medical

## 2023-04-18 ENCOUNTER — Telehealth: Payer: Self-pay | Admitting: Medical

## 2023-04-18 MED ORDER — TIZANIDINE HCL 4 MG PO TABS: 4.0000 mg | ORAL_TABLET | Freq: Four times a day (QID) | ORAL | 0 refills | Status: DC | PRN

## 2023-04-18 MED ORDER — TRAMADOL HCL 50 MG PO TABS: 50.0000 mg | ORAL_TABLET | Freq: Every day | ORAL | 0 refills | Status: DC | PRN

## 2023-04-18 NOTE — Telephone Encounter (Signed)
Pt called she is here until 1/8 and needs the tizanidine and tramadol sent to [facility] instead of [facility]

## 2023-04-18 NOTE — Telephone Encounter (Signed)
Does patient need an appt for the pain contract and urine sample can she just come in to do those things without being seen by provider?

## 2023-04-18 NOTE — Telephone Encounter (Signed)
I have called and cancelled medications at pharmacy at [facility]

## 2023-04-24 NOTE — Telephone Encounter (Signed)
 Called patient and left VM

## 2023-04-25 ENCOUNTER — Encounter: Payer: Medicare HMO | Admitting: Physical Medicine and Rehabilitation

## 2023-04-25 DIAGNOSIS — M25512 Pain in left shoulder: Secondary | ICD-10-CM | POA: Diagnosis not present

## 2023-04-26 NOTE — Telephone Encounter (Signed)
 Send a [employer]

## 2023-04-28 ENCOUNTER — Telehealth: Payer: Self-pay | Admitting: Medical

## 2023-04-28 NOTE — Telephone Encounter (Signed)
 Per [facility] [employer] [id number] approved til 05/02/24

## 2023-05-01 DIAGNOSIS — T84498A Other mechanical complication of other internal orthopedic devices, implants and grafts, initial encounter: Secondary | ICD-10-CM | POA: Diagnosis not present

## 2023-05-01 DIAGNOSIS — S22080A Wedge compression fracture of T11-T12 vertebra, initial encounter for closed fracture: Secondary | ICD-10-CM | POA: Diagnosis not present

## 2023-05-01 DIAGNOSIS — M549 Dorsalgia, unspecified: Secondary | ICD-10-CM | POA: Diagnosis not present

## 2023-05-01 DIAGNOSIS — M5459 Other low back pain: Secondary | ICD-10-CM | POA: Diagnosis not present

## 2023-05-02 ENCOUNTER — Ambulatory Visit (INDEPENDENT_AMBULATORY_CARE_PROVIDER_SITE_OTHER): Payer: Medicare HMO

## 2023-05-02 DIAGNOSIS — Z0000 Encounter for general adult medical examination without abnormal findings: Secondary | ICD-10-CM | POA: Diagnosis not present

## 2023-05-02 NOTE — Progress Notes (Signed)
 Subjective:   Brandi Stephens is a 78 y.o. female who presents for Medicare Annual (Subsequent) preventive examination.  Visit Complete: Virtual I connected with  Brandi Stephens on 05/02/23 by a audio enabled telemedicine application and verified that I am speaking with the correct person using two identifiers.  Interactive audio and video telecommunications were attempted between this provider and patient, however failed, due to patient having technical difficulties OR patient did not have access to video capability.  We continued and completed visit with audio only.  Patient Location: Home  Provider Location: Office/Clinic  I discussed the limitations of evaluation and management by telemedicine. The patient expressed understanding and agreed to proceed.  Vital Signs: Because this visit was a virtual/telehealth visit, some criteria may be missing or patient reported. Any vitals not documented were not able to be obtained and vitals that have been documented are patient reported.  Patient Medicare AWV questionnaire was completed by the patient on 04/26/2023; I have confirmed that all information answered by patient is correct and no changes since this date.  Cardiac Risk Factors include: advanced age (>80men, >64 women);diabetes mellitus;dyslipidemia;hypertension     Objective:    Today's Vitals   05/02/23 1526  PainSc: 7    There is no height or weight on file to calculate BMI.     05/02/2023    3:39 PM 07/18/2022   12:48 PM 06/20/2022   10:00 PM 06/20/2022   11:12 AM 03/18/2022   11:36 AM 02/26/2021   11:32 AM 10/30/2019    2:16 PM  Advanced Directives  Does Patient Have a Medical Advance Directive? Yes No Yes Yes Yes Yes Yes  Type of Estate Agent of [place];Living will  Healthcare Power of [employer] of [place];Living will Healthcare Power of [place];Living will Living will  Does patient want to make changes to medical advance  directive?  No - Patient declined No - Patient declined No - Patient declined     Copy of Healthcare Power of Attorney in Chart? Yes - validated most recent copy scanned in chart (See row information)  No - copy requested  Yes - validated most recent copy scanned in chart (See row information) Yes - validated most recent copy scanned in chart (See row information)   Would patient like information on creating a medical advance directive?  No - Patient declined         Current Medications (verified) Outpatient Encounter Medications as of 05/02/2023  Medication Sig   allopurinol  (ZYLOPRIM ) 300 MG tablet Take 300 mg by mouth daily.   Biotin  3 MG TABS Take 3 mg by mouth daily.   cefadroxil  (DURICEF) 500 MG capsule Take 1 capsule (500 mg total) by mouth 2 (two) times daily.   Cholecalciferol  (VITAMIN D3) 50 MCG (2000 UT) capsule Take 1 capsule (2,000 Units total) by mouth daily.   colchicine  0.6 MG tablet Take 2 tablets at once then take 1 tablet an hour later then then 1 tablet twice daily x 3 days.   diazepam  (VALIUM ) 10 MG tablet Take 1 tablet (10 mg total) by mouth daily as needed for anxiety.   levothyroxine  (SYNTHROID ) 75 MCG tablet Take 1 tablet (75 mcg total) by mouth daily.   metoCLOPramide  (REGLAN ) 5 MG tablet Take 1 tablet (5 mg total) by mouth 3 (three) times daily before meals.   pantoprazole  (PROTONIX ) 40 MG tablet Take 1 tablet (40 mg total) by mouth 2 (two) times daily.   polyethylene glycol powder (  GLYCOLAX /MIRALAX ) 17 GM/SCOOP powder Take 17 g by mouth daily as needed for moderate constipation.   Propylene Glycol (SYSTANE BALANCE OP) Place 1 drop into both eyes 4 (four) times daily as needed (dry eyes).   rOPINIRole  (REQUIP ) 1 MG tablet TAKE 1 TABLET(1 MG) BY MOUTH AT BEDTIME   tiZANidine  (ZANAFLEX ) 4 MG tablet Take 1 tablet (4 mg total) by mouth every 6 (six) hours as needed for muscle spasms.   topiramate  (TOPAMAX ) 100 MG tablet Take 1 tablet (100 mg total) by mouth at bedtime.    traMADol  (ULTRAM ) 50 MG tablet Take 1 tablet (50 mg total) by mouth daily as needed.   meloxicam  (MOBIC ) 7.5 MG tablet Take 1 tablet (7.5 mg total) by mouth daily as needed for pain. (Patient not taking: Reported on 05/02/2023)   No facility-administered encounter medications on file as of 05/02/2023.    Allergies (verified) Ampicillin , Gabapentin , Penicillins, Percocet [oxycodone -acetaminophen ], Crestor [rosuvastatin], and Other   History: Past Medical History:  Diagnosis Date   AIN (anal intraepithelial neoplasia) anal canal    Anemia    with pregnancy   Anxiety    Arthritis    knees, lower back, knees   Chronic constipation    Chronic diastolic CHF (congestive heart failure) (HCC) 07/05/2019   CKD (chronic kidney disease)    Coarse tremors    Essential   COPD (chronic obstructive pulmonary disease) (HCC)    2017 chest CT, history of   Degenerative lumbar spinal stenosis    Depression    Depression 07/05/2019   Drug dependency (HCC)    Elevated PTHrP level 05/09/2019   EtOH use 07/05/2019   Pt denies heavy drinking however   Gastroparesis    GERD (gastroesophageal reflux disease)    pt denies   Gout    Grade I diastolic dysfunction 02/01/2016   Noted ECHO   Hemorrhoids    History of adenomatous polyp of colon    History of basal cell carcinoma (BCC) excision 2015   nose   History of sepsis 05/14/2014   post lumbar surgery   History of shingles    x2   Hyperlipidemia    Hypertension    Hypothyroidism    Irregular heart beat    history of while undergoing radiation   Malignant neoplasm of upper-inner quadrant of left breast in female, estrogen receptor positive [facility]) oncologist-  dr layla--  per lov in epic no recurrence   dx 07-13-2015  Left breast invasive ductal carcinoma, Stage IA, Grade 1 (TXN0),  09-16-2015  s/p  left breast lumpectomy with sln bx's,  completed radiation 11-18-2015,  started antiestrogen therapy 12-14-2015   Neuropathy    Left foot and  back of left leg   Obesity (BMI 30-39.9) 07/05/2019   Personal history of radiation therapy    completed 11-18-2015  left breast   Pneumonia    PONV (postoperative nausea and vomiting)    Prediabetes    diet controlled, no med   Restless leg syndrome    Seasonal allergies    Seizure (HCC) 05/2015   due to sepsis, only one time episode   Tremor    Wears partial dentures    lower    Past Surgical History:  Procedure Laterality Date   ABDOMINAL HYSTERECTOMY  1985   BASAL CELL CARCINOMA EXCISION  10/15   nose   BIOPSY  07/22/2022   Procedure: BIOPSY;  Surgeon: Aneita Gwendlyn DASEN, MD;  Location: [facility];  Service: Gastroenterology;;   BREAST BIOPSY Right 08/24/2015  BREAST BIOPSY Right 07/13/2015   BREAST BIOPSY Left 07/13/2015   BREAST EXCISIONAL BIOPSY Left 1972   BREAST LUMPECTOMY Left    BREAST LUMPECTOMY WITH RADIOACTIVE SEED AND SENTINEL LYMPH NODE BIOPSY Left 09/16/2015   Procedure: BREAST LUMPECTOMY WITH RADIOACTIVE SEED AND SENTINEL LYMPH NODE BIOPSY;  Surgeon: Jina Nephew, MD;  Location: [facility];  Service: General;  Laterality: Left;   BREAST REDUCTION SURGERY Bilateral 1998   CATARACT EXTRACTION W/ INTRAOCULAR LENS IMPLANT Left 2016   CERVICAL SPINE SURGERY  1995   hallo   COLONOSCOPY  01/2013   polyps   ESOPHAGOGASTRODUODENOSCOPY N/A 07/22/2022   Procedure: ESOPHAGOGASTRODUODENOSCOPY (EGD);  Surgeon: Aneita Gwendlyn DASEN, MD;  Location: [facility];  Service: Gastroenterology;  Laterality: N/A;   EYE SURGERY Left 2016   HAND SURGERY Left 02-19-2002   dr murrell  @[facility]    repair collateral ligament/ MPJ left thumb   HARDWARE REMOVAL N/A 07/24/2019   Procedure: REVISION OF HARDWARE Lumbar two - Sacral one. Extention of Fusion to Lumbar one;  Surgeon: Lanis Pupa, MD;  Location: [facility];  Service: Neurosurgery;  Laterality: N/A;   HEMORRHOID SURGERY  2008   HIGH RESOLUTION ANOSCOPY N/A 02/28/2018   Procedure: HIGH RESOLUTION ANOSCOPY WITH BIOPSY;   Surgeon: Debby Hila, MD;  Location: [facility];  Service: General;  Laterality: N/A;   KNEE ARTHROSCOPY Right 2002   LUMBAR SPINE SURGERY  03-20-2015   dr lanis   fusion L4-5, L5-S1   MOUTH SURGERY  07/14/2018   2 infected teeth removed   MULTIPLE TOOTH EXTRACTIONS     with bone grafting lower bottom right and left   REDUCTION MAMMAPLASTY Bilateral    RIGHT COLECTOMY  09-10-2003   dr merrilyn @[facility]    multiple colon polyps (per path tubular adenoma's, hyperplastic , benign appendix, benign two lymph nodes   ROTATOR CUFF REPAIR Left 04/2016   TONSILLECTOMY  1969   TUBAL LIGATION Bilateral 1982   Family History  Problem Relation Age of Onset   Atrial fibrillation Mother    Ulcers Father    Breast cancer Maternal Aunt    Lung cancer Maternal Uncle    Stomach cancer Maternal Grandmother    Lung cancer Maternal Aunt    Brain cancer Maternal Aunt    Prostate cancer Maternal Grandfather    Stroke Paternal Grandmother    Tuberculosis Paternal Grandfather    Colon cancer Neg Hx    Esophageal cancer Neg Hx    Rectal cancer Neg Hx    Social History   Socioeconomic History   Marital status: Married    Spouse name: robert   Number of children: 3   Years of education: college   Highest education level: Not on file  Occupational History   Occupation: retired  Tobacco Use   Smoking status: Former    Current packs/day: 0.00    Average packs/day: 0.5 packs/day for 35.0 years (17.5 ttl pk-yrs)    Types: Cigarettes    Start date: 05/23/1983    Quit date: 05/22/2018    Years since quitting: 4.9   Smokeless tobacco: Never  Vaping Use   Vaping status: Never Used  Substance and Sexual Activity   Alcohol  use: Yes    Comment: 1 glass of wine a night   Drug use: Not Currently    Comment: CBD tincture   Sexual activity: Yes    Birth control/protection: Post-menopausal  Other Topics Concern   Not on file  Social History Narrative   Lives  with husband in a 2 story  home.  Has 2 living children.  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  Education: college.    Right-handed.   Five cups coffee per week.   Social Drivers of Corporate Investment Banker Strain: Low Risk  (05/02/2023)   Overall Financial Resource Strain (CARDIA)    Difficulty of Paying Living Expenses: Not hard at all  Food Insecurity: No Food Insecurity (05/02/2023)   Hunger Vital Sign    Worried About Running Out of Food in the Last Year: Never true    Ran Out of Food in the Last Year: Never true  Transportation Needs: No Transportation Needs (05/02/2023)   PRAPARE - Administrator, Civil Service (Medical): No    Lack of Transportation (Non-Medical): No  Physical Activity: Inactive (05/02/2023)   Exercise Vital Sign    Days of Exercise per Week: 0 days    Minutes of Exercise per Session: 0 min  Stress: Stress Concern Present (05/02/2023)   [employer] of Occupational Health - Occupational Stress Questionnaire    Feeling of Stress : To some extent  Social Connections: Moderately Integrated (05/02/2023)   Social Connection and Isolation Panel [NHANES]    Frequency of Communication with Friends and Family: More than three times a week    Frequency of Social Gatherings with Friends and Family: More than three times a week    Attends Religious Services: 1 to 4 times per year    Active Member of [employer] or Organizations: No    Attends Engineer, Structural: Never    Marital Status: Married    Tobacco Counseling Counseling given: Not Answered   Clinical Intake:  Pre-visit preparation completed: Yes  Pain : 0-10 Pain Score: 7  Pain Type: Chronic pain Pain Location: Back Pain Orientation: Lower, Mid Pain Descriptors / Indicators: Aching Pain Onset: More than a month ago Pain Frequency: Constant     Nutritional Risks: Nausea/ vomitting/ diarrhea (has gastroporesis) Diabetes: Yes CBG done?: No Did pt. bring in CBG monitor from home?: No  How  often do you need to have someone help you when you read instructions, pamphlets, or other written materials from your doctor or pharmacy?: 1 - Never  Interpreter Needed?: No  Information entered by :: NAllen LPN   Activities of Daily Living    04/26/2023   10:48 AM 07/24/2022    1:39 PM  In your present state of health, do you have any difficulty performing the following activities:  Hearing? 0   Vision? 0   Difficulty concentrating or making decisions? 0   Walking or climbing stairs? 1   Comment due to back   Dressing or bathing? 1   Comment has to take time   Doing errands, shopping? 1 0  Comment does not drive   Preparing Food and eating ? Y   Comment husband prepares   Using the Toilet? N   In the past six months, have you accidently leaked urine? Y   Do you have problems with loss of bowel control? Y   Managing your Medications? N   Managing your Finances? N   Housekeeping or managing your Housekeeping? Y     Patient Care Team: Tysinger, Alm RAMAN, PA-C as PCP - General (Family Medicine) Alvan Ronal BRAVO, MD as PCP - Cardiology (Cardiology) Aron Shoulders, MD as Consulting Physician (General Surgery) Joyce Norleen BROCKS, MD as Consulting Physician (Family Medicine) Jadine Charleston, MD as Consulting Physician (Dermatology) Holdrege,  Morna Pickle, NP as Nurse Practitioner (Hematology and Oncology) Josefina Chew, MD as Consulting Physician (Orthopedic Surgery) Armbruster, Elspeth SQUIBB, MD as Consulting Physician (Gastroenterology) Lanis Pupa, MD as Consulting Physician (Neurosurgery) Onita Duos, MD as Consulting Physician (Neurology) Liane Sharyne MATSU, [facility] (Inactive) as Pharmacist (Pharmacist) Neda Jennet LABOR, MD as Consulting Physician (Pulmonary Disease)  Indicate any recent Medical Services you may have received from other than [facility] providers in the past year (date may be approximate).     Assessment:   This is a routine wellness examination for  Donise.  Hearing/Vision screen Hearing Screening - Comments:: Denies hearing issues Vision Screening - Comments:: Regular eye exams, [facility]   Goals Addressed             This Visit's Progress    Patient Stated       05/02/2023, get back feeling better       Depression Screen    05/02/2023    3:41 PM 04/10/2023   11:16 AM 04/07/2023   10:24 AM 11/03/2022   10:36 AM 07/14/2022   11:02 AM 05/17/2022    2:28 PM 03/18/2022   11:40 AM  PHQ 2/9 Scores  PHQ - 2 Score 0 1 0 0 0 0 0  PHQ- 9 Score       2    Fall Risk    04/26/2023   10:48 AM 04/10/2023   11:17 AM 04/07/2023   10:23 AM 11/03/2022   10:36 AM 07/14/2022   11:02 AM  Fall Risk   Falls in the past year? 1 1 1 1  0  Comment tripped on foot and fell out of chair      Number falls in past yr: 1 0 1 1 0  Comment   6 weeks ago with injury may 2024   Injury with Fall? 1 1 1 1  0  Comment hurting back      Risk for fall due to : Impaired balance/gait;Impaired mobility;History of fall(s);Medication side effect Orthopedic patient History of fall(s);Impaired balance/gait;Orthopedic patient History of fall(s);Impaired balance/gait   Follow up Falls prevention discussed;Falls evaluation completed Falls evaluation completed       MEDICARE RISK AT HOME: Medicare Risk at Home Any stairs in or around the home?: (Patient-Rptd) Yes If so, are there any without handrails?: (Patient-Rptd) No Home free of loose throw rugs in walkways, pet beds, electrical cords, etc?: (Patient-Rptd) Yes Adequate lighting in your home to reduce risk of falls?: (Patient-Rptd) Yes Life alert?: (Patient-Rptd) No Use of a cane, walker or w/c?: (Patient-Rptd) Yes Grab bars in the bathroom?: (Patient-Rptd) No Shower chair or bench in shower?: (Patient-Rptd) Yes Elevated toilet seat or a handicapped toilet?: (Patient-Rptd) Yes  TIMED UP AND GO:  Was the test performed?  No    Cognitive Function:        05/02/2023    3:42 PM 03/18/2022    11:43 AM 02/26/2021   11:37 AM  6CIT Screen  What Year? 0 points 0 points 0 points  What month? 0 points 0 points 0 points  What time? 0 points 3 points 0 points  Count back from 20 0 points 0 points 0 points  Months in reverse 0 points 0 points 0 points  Repeat phrase 2 points 2 points 2 points  Total Score 2 points 5 points 2 points    Immunizations Immunization History  Administered Date(s) Administered   Fluad Quad(high Dose 65+) 12/20/2018, 01/22/2020, 01/05/2022   Influenza, High Dose Seasonal PF 05/01/2017   Influenza-Unspecified 12/17/2013,  12/12/2017   PFIZER Comirnaty(Gray Top)Covid-19 Tri-Sucrose Vaccine 09/20/2019, 10/22/2019   PNEUMOCOCCAL CONJUGATE-20 01/05/2022   Pneumococcal Conjugate-13 01/14/2014   Pneumococcal Polysaccharide-23 07/06/2016   Tdap 07/02/2013   Zoster Recombinant(Shingrix ) 12/12/2017, 01/04/2019    TDAP status: Up to date  Flu Vaccine status: Declined, Education has been provided regarding the importance of this vaccine but patient still declined. Advised may receive this vaccine at local pharmacy or Health Dept. Aware to provide a copy of the vaccination record if obtained from local pharmacy or Health Dept. Verbalized acceptance and understanding.  Pneumococcal vaccine status: Up to date  Covid-19 vaccine status: Information provided on how to obtain vaccines.   Qualifies for Shingles Vaccine? Yes   Zostavax completed Yes   Shingrix  Completed?: Yes  Screening Tests Health Maintenance  Topic Date Due   OPHTHALMOLOGY EXAM  11/09/2022   FOOT EXAM  04/08/2023   COVID-19 Vaccine (3 - Pfizer risk series) 05/18/2023 (Originally 11/19/2019)   INFLUENZA VACCINE  07/17/2023 (Originally 11/17/2022)   DTaP/Tdap/Td (2 - Td or Tdap) 07/03/2023   HEMOGLOBIN A1C  08/14/2023   Diabetic kidney evaluation - Urine ACR  11/03/2023   Diabetic kidney evaluation - eGFR measurement  04/09/2024   Medicare Annual Wellness (AWV)  05/01/2024   Pneumonia Vaccine 48+  Years old  Completed   DEXA SCAN  Completed   Hepatitis C Screening  Completed   Zoster Vaccines- Shingrix   Completed   HPV VACCINES  Aged Out   Colonoscopy  Discontinued    Health Maintenance  Health Maintenance Due  Topic Date Due   OPHTHALMOLOGY EXAM  11/09/2022   FOOT EXAM  04/08/2023    Colorectal cancer screening: No longer required.   Mammogram status: Completed 01/27/2023. Repeat every year  Bone Density status: Completed 08/16/2016.   Lung Cancer Screening: (Low Dose CT Chest recommended if Age 32-80 years, 20 pack-year currently smoking OR have quit w/in 15years.) does not qualify.   Lung Cancer Screening Referral: no  Additional Screening:  Hepatitis C Screening: does qualify; Completed 07/08/2016  Vision Screening: Recommended annual ophthalmology exams for early detection of glaucoma and other disorders of the eye. Is the patient up to date with their annual eye exam?  Yes  Who is the provider or what is the name of the office in which the patient attends annual eye exams? [facility] If pt is not established with a provider, would they like to be referred to a provider to establish care? No .   Dental Screening: Recommended annual dental exams for proper oral hygiene  Diabetic Foot Exam: Diabetic Foot Exam: Overdue, Pt has been advised about the importance in completing this exam. Pt is scheduled for diabetic foot exam on next appointment.  Community Resource Referral / Chronic Care Management: CRR required this visit?  No   CCM required this visit?  No     Plan:     I have personally reviewed and noted the following in the patient's chart:   Medical and social history Use of alcohol , tobacco or illicit drugs  Current medications and supplements including opioid prescriptions. Patient is not currently taking opioid prescriptions. Functional ability and status Nutritional status Physical activity Advanced directives List of other  physicians Hospitalizations, surgeries, and ER visits in previous 12 months Vitals Screenings to include cognitive, depression, and falls Referrals and appointments  In addition, I have reviewed and discussed with patient certain preventive protocols, quality metrics, and best practice recommendations. A written personalized care plan for preventive services as well  as general preventive health recommendations were provided to patient.     Ardella FORBES Dawn, LPN   [id number]   After Visit Summary: (MyChart) Due to this being a telephonic visit, the after visit summary with patients personalized plan was offered to patient via MyChart   Nurse Notes: none

## 2023-05-02 NOTE — Patient Instructions (Signed)
 [name] , Thank you for taking time to come for your Medicare Wellness Visit. I appreciate your ongoing commitment to your health goals. Please review the following plan we discussed and let me know if I can assist you in the future.   Referrals/Orders/Follow-Ups/Clinician Recommendations: none  This is a list of the screening recommended for you and due dates:  Health Maintenance  Topic Date Due   Eye exam for diabetics  11/09/2022   Complete foot exam   04/08/2023   COVID-19 Vaccine (3 - Pfizer risk series) 05/18/2023*   Flu Shot  07/17/2023*   DTaP/Tdap/Td vaccine (2 - Td or Tdap) 07/03/2023   Hemoglobin A1C  08/14/2023   Yearly kidney health urinalysis for diabetes  11/03/2023   Yearly kidney function blood test for diabetes  04/09/2024   Medicare Annual Wellness Visit  05/01/2024   Pneumonia Vaccine  Completed   DEXA scan (bone density measurement)  Completed   Hepatitis C Screening  Completed   Zoster (Shingles) Vaccine  Completed   HPV Vaccine  Aged Out   Colon Cancer Screening  Discontinued  *Topic was postponed. The date shown is not the original due date.    Advanced directives: (In Chart) A copy of your advanced directives are scanned into your chart should your provider ever need it.  Next Medicare Annual Wellness Visit scheduled for next year: Yes  insert Preventive Care attachment Insert FALL PREVENTION attachment if needed

## 2023-05-03 LAB — LAB REPORT - SCANNED: EGFR: 49

## 2023-05-04 ENCOUNTER — Other Ambulatory Visit: Payer: Self-pay | Admitting: Neurosurgery

## 2023-05-04 DIAGNOSIS — T84498A Other mechanical complication of other internal orthopedic devices, implants and grafts, initial encounter: Secondary | ICD-10-CM

## 2023-05-09 ENCOUNTER — Encounter: Payer: Self-pay | Admitting: Neurosurgery

## 2023-05-09 NOTE — Telephone Encounter (Signed)
PLEASE BE ADVISED THAT LETTER OF APPROVAL IN MEDIA OF CHART  FOR OZEMPIC FROM [employer]  THE LETTER SAYS PT IS APPROVED 04/17/2024

## 2023-05-18 ENCOUNTER — Other Ambulatory Visit: Payer: Self-pay | Admitting: Medical

## 2023-05-18 MED ORDER — TIZANIDINE HCL 4 MG PO TABS: 4.0000 mg | ORAL_TABLET | Freq: Four times a day (QID) | ORAL | 0 refills | Status: DC | PRN

## 2023-05-19 ENCOUNTER — Ambulatory Visit
Admission: RE | Admit: 2023-05-19 | Discharge: 2023-05-19 | Disposition: A | Discharge status: Home / Self Care | Payer: Medicare HMO | Source: Ambulatory Visit | Attending: Neurosurgery | Admitting: Neurosurgery

## 2023-05-19 ENCOUNTER — Encounter: Payer: Self-pay | Admitting: Neurosurgery

## 2023-05-19 ENCOUNTER — Other Ambulatory Visit: Payer: Self-pay | Admitting: Neurosurgery

## 2023-05-19 DIAGNOSIS — I700 Atherosclerosis of aorta: Secondary | ICD-10-CM | POA: Diagnosis not present

## 2023-05-19 DIAGNOSIS — S22080A Wedge compression fracture of T11-T12 vertebra, initial encounter for closed fracture: Secondary | ICD-10-CM

## 2023-05-19 DIAGNOSIS — T84498A Other mechanical complication of other internal orthopedic devices, implants and grafts, initial encounter: Secondary | ICD-10-CM | POA: Diagnosis not present

## 2023-05-23 ENCOUNTER — Ambulatory Visit: Payer: Medicare HMO | Attending: Internal Medicine | Admitting: Internal Medicine

## 2023-05-23 ENCOUNTER — Encounter: Payer: Self-pay | Admitting: Internal Medicine

## 2023-05-23 VITALS — BP 114/74 | HR 112 | Ht 67.0 in | Wt 155.6 lb

## 2023-05-23 DIAGNOSIS — I10 Essential (primary) hypertension: Secondary | ICD-10-CM

## 2023-05-23 NOTE — Progress Notes (Signed)
 Cardiology Office Note:    Date:  05/23/2023   ID:  Brandi, Stephens [birth year], MRN [id number]  PCP:  Bulah Alm RAMAN, PA-C   [facility] Providers Cardiologist:  Alvan Ronal BRAVO, MD     Referring MD: Bulah Alm RAMAN, PA-C   No chief complaint on file. Hypoxia  History of Present Illness:    Brandi Stephens is a 78 y.o. female with a hx of 35 pack year smoker,  R colectomy 2/2 large polyp in 2014, COPD, severe lumbar disc degeneration s/p fusion L2-S1, T12 mild compression fraction, HTN  Seen by Dr. Mona for palpitations and chest discomfort. She had a normal nuclear stress test. Echo showed normal LV function. Cardiac monitor showed sinus tachycardia and occasional PVCs. This correlated with her symptoms. She was managed with low dose metop. She was seen earlier this year with LE edema with venous stasis managed with bumex . She also had hypoxia on O2. She was hypotensive and dizzy, her bumex  was decreased to 1 mg daily from BID. She has no signs of pulmonary HTN on echo, no enlarged RV. With dyspnea, recommended PFTs in the future. She saw pulmonology. She was started on [name] in the past. She's refrained from smoking. She's had PFTs in March of 2023 c/w COPD.  She has hx of left breast CA hx of lumpectomy treated with radiation. She was on hormone therapy this was 5 years ago.   Today she presents in her wheel chair and O2. She had labs 1 month ago which showed normal renal fxn. She continue on bumex  1 mg daily. She wears compression stockings. She ambulates with a walker.  No PND or orthopnea.  No pitting edema. 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  She has persistently elevated inflammatory markers, MRI did not show new infection. She has chronic pain  Prior lexiscans 2017, 2022 were normal. Echoes have shown normal LV function.   Interim Hx 08/02/2022 On prior visit, she had a ziopatch that showed 1 triggered event with sinus  tachycardia.  She was admitted 07/18/2022 for N/V. She was orthostatic. Started on midodrine , seen by cardiology.  She has had persistent vomiting since. She was on ozempic . She stopped taking in February. She had a recent outpatient GI evaluation and there is concern for gastroparesis with ozempic . Started reglan . Today she feels week. Orthostatics are +. She notes persistent nausea and vomiting.  Interim hx 05/23/2023 Her midodrine  was stopped. BP is normal. Her back is cause pain, she has significant degenerative disc dx. She has had several surgeries, infections.  Notes LLQ pain. No blood in her stool. She has gastroparesis. She had mechanical fall. Her feet are numb.     Allergies:   Ampicillin , Gabapentin , Penicillins, Percocet [oxycodone -acetaminophen ], Crestor [rosuvastatin], and Other   Family History: The patient's family history includes Atrial fibrillation in her mother; Brain cancer in her maternal aunt; Breast cancer in her maternal aunt; Lung cancer in her maternal aunt and maternal uncle; Prostate cancer in her maternal grandfather; Stomach cancer in her maternal grandmother; Stroke in her paternal grandmother; Tuberculosis in her paternal grandfather; Ulcers in her father. There is no history of Colon cancer, Esophageal cancer, or Rectal cancer.  ROS:   Please see the history of present illness.     All other systems reviewed and are negative.  EKGs/Labs/Other Studies Reviewed:    The following studies were reviewed today:   EKG:  EKG is  ordered today.  The  ekg ordered today demonstrates   Sinus tachycardia HR 122 bpm  Recent Labs: 07/19/2022: B Natriuretic Peptide 122.2 11/03/2022: Magnesium  1.7 02/13/2023: ALT 12; TSH 3.660 04/10/2023: BUN 22; Creat 1.06; Hemoglobin 13.9; Platelets 226; Potassium 4.1; Sodium 144   Recent Lipid Panel    Component Value Date/Time   CHOL 219 (H) 11/03/2022 1137   TRIG 162 (H) 11/03/2022 1137   HDL 56 11/03/2022 1137   CHOLHDL 3.9  11/03/2022 1137   CHOLHDL 4.2 07/08/2016 1042   VLDL 52 (H) 07/08/2016 1042   LDLCALC 134 (H) 11/03/2022 1137     Risk Assessment/Calculations:           Physical Exam:    VS:  Vitals:   05/23/23 1110  BP: 114/74  Pulse: (!) 112  SpO2: 97%      Wt Readings from Last 3 Encounters:  04/17/23 156 lb (70.8 kg)  04/10/23 156 lb (70.8 kg)  04/07/23 156 lb 12.8 oz (71.1 kg)     GEN:  Well nourished, well developed in no acute distress HEENT: Normal NECK: No JVD; No carotid bruits LYMPHATICS: No lymphadenopathy CARDIAC: RRR, no murmurs, rubs, gallops RESPIRATORY: nasal cannula in place,  no rales ABDOMEN: Soft, non-tender, non-distended MUSCULOSKELETAL:  No pitting edema; No deformity  SKIN: Warm and dry NEUROLOGIC:  Alert and oriented x 3 PSYCHIATRIC:  Normal affect   ASSESSMENT and PLAN  Inappropriate sinus tach Autonomic dysfunction [ no DM2, degenerative disc dx association?] -Had prior syncope + orthostasis in the setting of N/V. Zio shows sinus tachycardia. In the setting of dehydration with nausea and vomiting. Recommend hydration with electrolytes. Plan was to continue until her n/v resolves off of ozempic . These symptoms have improved. Still has sinus tach.  Her back pain is contributing Bp was low, hesitate to start a BB.   Thyroid  disease: TSH fluctuates between very hypo and hyper. TSH was normal in October.  ?Diastolic CHF: No hospitalizations recently. For this, GFR is normal. Do not think she has HFpEF, there was question before with E/e' that was noted to be elevated but on repeat study < 10. She has remained euvolemic. BNPs have been normal. She has lost weight.    Dispo   In order of problems listed above:  Follow up in 6 months with an APP        Medication Adjustments/Labs and Tests Ordered: Current medicines are reviewed at length with the patient today.  Concerns regarding medicines are outlined above.  No orders of the defined types were  placed in this encounter.  No orders of the defined types were placed in this encounter.   There are no Patient Instructions on file for this visit.   Signed, Alvan Ronal BRAVO, MD  05/23/2023 11:10 AM    [facility]

## 2023-05-23 NOTE — Patient Instructions (Signed)
Medication Instructions:  No changes  *If you need a refill on your cardiac medications before your next appointment, please call your pharmacy*   Lab Work: None  If you have labs (blood work) drawn today and your tests are completely normal, you will receive your results only by: MyChart Message (if you have MyChart) OR A paper copy in the mail If you have any lab test that is abnormal or we need to change your treatment, we will call you to review the results.   Testing/Procedures: None    Follow-Up: At [facility], you and your health needs are our priority.  As part of our continuing mission to provide you with exceptional heart care, we have created designated Provider Care Teams.  These Care Teams include your primary Cardiologist (physician) and Advanced Practice Providers (APPs -  Physician Assistants and Nurse Practitioners) who all work together to provide you with the care you need, when you need it.  We recommend signing up for the patient portal called "MyChart".  Sign up information is provided on this After Visit Summary.  MyChart is used to connect with patients for Virtual Visits (Telemedicine).  Patients are able to view lab/test results, encounter notes, upcoming appointments, etc.  Non-urgent messages can be sent to your provider as well.   To learn more about what you can do with MyChart, go to [email or web address].    Your next appointment:   6 month(s)  Provider:   With any APP   Other Instructions

## 2023-05-25 ENCOUNTER — Telehealth: Payer: Self-pay | Admitting: Internal Medicine

## 2023-05-25 ENCOUNTER — Ambulatory Visit: Payer: Medicare HMO | Admitting: Family Medicine

## 2023-05-25 ENCOUNTER — Telehealth: Payer: Self-pay

## 2023-05-25 NOTE — Telephone Encounter (Signed)
 Called and spoke to patient's daughter [name]. Verified patient's name and DOB. [name] reports patient's BP has been elevated since yesterday. She stated patient started having cramps in her feet and legs. She report patient took and Ecotrin because she was concerned about having a stroke. She deny CP, SOB, headache, blurred vision or any other symptoms at this time. She did say patient has been in a lot of pain due to her back with a rate of 8-10/10. Patient is currently not on any medications for her BP. Please advise. Last office visit 2/4.    BP Readings  2/5  159/90         164/90         171/99 2/6   174/103

## 2023-05-25 NOTE — Telephone Encounter (Signed)
 Spoke to patient and she will be here at 2:15pm.

## 2023-05-25 NOTE — Telephone Encounter (Signed)
 Pt states her pain is increasing and the tramadol  doesn't touch her pain. Would like something sent in to the [facility] on [address]. States she needs to get through the weekend.

## 2023-05-25 NOTE — Telephone Encounter (Signed)
 Pt c/o BP issue: STAT if pt c/o blurred vision, one-sided weakness or slurred speech  1. What are your last 5 BP readings?   174/103  H 99 today around 8:00 am  2. Are you having any other symptoms (ex. Dizziness, headache, blurred vision, passed out)?   No  3. What is your BP issue?   Daughter [place]) is concerned patient's BP has been trending high and has been in the 169's since yesterday.  Daughter noted she is not currently with the patient.

## 2023-05-25 NOTE — Telephone Encounter (Signed)
 Called and spoke to daughter. Below message relayed per Dr Alvan. Daughter patient does have an appt with here PCP today.   Alvan Ronal BRAVO, MD  Cv Div Nl Triage; Alto Race, LPN4 minutes ago (9:26 AM)  MB Pain can increase BP. Please ask her to continue to monitor today

## 2023-05-25 NOTE — Telephone Encounter (Signed)
 Pt called with high blood pressure. I advised pt to contact cardiology about this, since she was seen by cardiology. I advised her that there was nothing we could do since she just saw cardiology and had normal exam. Pt is waiting on cardiology to call her back before she will cancel this appt. I advised her again we do not need to see her. She said if cardiology doesn't call her back in time, she would like to still come in and have her bp checked to make sure it correct. She is using a wrist monitor. I advised her that it can differ from how we take BP at our office. I will monitor this to see if cardiology replies

## 2023-05-25 NOTE — Telephone Encounter (Signed)
 I had also called pt's daughter & she states pt's Cardiologist is not in the office today, his first available appt is 05/31/23, and they will send a message back but they recommended her keep the appt today with her PCP office.

## 2023-05-26 ENCOUNTER — Ambulatory Visit
Admission: RE | Admit: 2023-05-26 | Discharge: 2023-05-26 | Disposition: A | Discharge status: Home / Self Care | Payer: Medicare HMO | Source: Ambulatory Visit | Attending: Neurosurgery

## 2023-05-26 DIAGNOSIS — M5124 Other intervertebral disc displacement, thoracic region: Secondary | ICD-10-CM | POA: Diagnosis not present

## 2023-05-26 DIAGNOSIS — S22080A Wedge compression fracture of T11-T12 vertebra, initial encounter for closed fracture: Secondary | ICD-10-CM

## 2023-05-26 DIAGNOSIS — M4856XD Collapsed vertebra, not elsewhere classified, lumbar region, subsequent encounter for fracture with routine healing: Secondary | ICD-10-CM | POA: Diagnosis not present

## 2023-05-26 NOTE — Telephone Encounter (Signed)
 Pt was notified. Pt has MRI today and advised to call Dr. Ronell Coe office to get pain medication

## 2023-05-29 ENCOUNTER — Other Ambulatory Visit: Payer: Self-pay | Admitting: Medical

## 2023-05-30 ENCOUNTER — Telehealth: Payer: Self-pay | Admitting: Medical

## 2023-05-30 NOTE — Telephone Encounter (Signed)
Ozempic PAP received, pt informed

## 2023-05-31 DIAGNOSIS — S22080A Wedge compression fracture of T11-T12 vertebra, initial encounter for closed fracture: Secondary | ICD-10-CM | POA: Diagnosis not present

## 2023-05-31 DIAGNOSIS — Z6825 Body mass index (BMI) 25.0-25.9, adult: Secondary | ICD-10-CM | POA: Diagnosis not present

## 2023-05-31 DIAGNOSIS — T84498A Other mechanical complication of other internal orthopedic devices, implants and grafts, initial encounter: Secondary | ICD-10-CM | POA: Diagnosis not present

## 2023-06-01 ENCOUNTER — Telehealth: Payer: Self-pay

## 2023-06-01 ENCOUNTER — Other Ambulatory Visit: Payer: Self-pay | Admitting: Medical

## 2023-06-01 MED ORDER — TIZANIDINE HCL 4 MG PO TABS: 4.0000 mg | ORAL_TABLET | Freq: Every day | ORAL | 0 refills | Status: DC

## 2023-06-01 NOTE — Telephone Encounter (Signed)
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 Patient wants to make sure this is okay with Dr. Drue Second. She requested we contact her daughter, Carollee Herter.   Per Dr. Drue Second: Yes it is okay for her to get nerve block   Tamera Stands, no answer. Left voicemail stating Dr. Drue Second is okay with [facility] proceeding with nerve block and to call with any questions.   Sandie Ano, RN

## 2023-06-01 NOTE — Progress Notes (Signed)
   06/01/2023  Patient ID: Brandi Stephens, female   DOB: Jun 12, 1945, 78 y.o.   MRN: [id number]  Patient's first shipment of Ozempic arrived at wrong location. Contacted [facility] and they request an HCP information change be submitted.  Submitted form, next shipment should arrive at [facility] as intended.  Sherrill Raring, PharmD Clinical Pharmacist [phone]

## 2023-06-06 ENCOUNTER — Ambulatory Visit (INDEPENDENT_AMBULATORY_CARE_PROVIDER_SITE_OTHER): Payer: Medicare HMO | Admitting: Medical

## 2023-06-06 VITALS — BP 130/90 | HR 123

## 2023-06-06 DIAGNOSIS — L729 Follicular cyst of the skin and subcutaneous tissue, unspecified: Secondary | ICD-10-CM | POA: Diagnosis not present

## 2023-06-06 DIAGNOSIS — M549 Dorsalgia, unspecified: Secondary | ICD-10-CM

## 2023-06-06 DIAGNOSIS — G4700 Insomnia, unspecified: Secondary | ICD-10-CM | POA: Diagnosis not present

## 2023-06-06 DIAGNOSIS — G8929 Other chronic pain: Secondary | ICD-10-CM | POA: Insufficient documentation

## 2023-06-06 DIAGNOSIS — R030 Elevated blood-pressure reading, without diagnosis of hypertension: Secondary | ICD-10-CM | POA: Diagnosis not present

## 2023-06-06 DIAGNOSIS — G894 Chronic pain syndrome: Secondary | ICD-10-CM | POA: Diagnosis not present

## 2023-06-06 DIAGNOSIS — I10 Essential (primary) hypertension: Secondary | ICD-10-CM | POA: Diagnosis not present

## 2023-06-06 DIAGNOSIS — M62838 Other muscle spasm: Secondary | ICD-10-CM | POA: Diagnosis not present

## 2023-06-06 MED ORDER — CARISOPRODOL 250 MG PO TABS: 250.0000 mg | ORAL_TABLET | Freq: Two times a day (BID) | ORAL | 0 refills | Status: DC

## 2023-06-06 NOTE — Telephone Encounter (Signed)
Patient left detailed voicemail requesting call back regarding nerve block and if Dr. Drue Second believes she should have it done with history of infection.  Relayed to patient per Dr Drue Second okay to receive nerve block. No other questions at this time.  Juanita Laster, RMA

## 2023-06-06 NOTE — Progress Notes (Signed)
Subjective:  Chief Complaint  Patient presents with   Consult    Saw neurosurgeon last week, dx with Adjacent Segment Disease, T12 is now a wedge- no bone-screws aren't attached to anything . Possible another back surgery- S1-T10, nerve block tomorrow morning in the spine, infectious disease still has a spine infectious. And has an appt on Thursday for back brace to help. Neurosurgeon gave dilaudid- but now constipated and only helps to when up and moving but not sleeping much and needs something to help with sleep. Need tinadenzine needs more often & not once daily   multiple issues    Sleeping only 1.5-2 hours in a 24 hour period due to pain. BP is going high for the last week. 107-169/62-95 pulse 54-101 Constipated for the last 3-4 days Has bumps popping up everywhere- ganglion cysts Feet are burning- pins and needles and numb Stopped Tramadol as it wasn't helping at all.   Here for several concerns.  Here with her daughter  [place] since she is staying at [employer] part-time.  However because of her new health issues and worsening issues she has been staying with her daughter here in [place] since December 19  She notes that some of her medicines last got [place] beach and they want to transfer him here in [place] so she has been out of Topamax and Valium since December  She reports multiple symptoms of getting her problems lately including severe pain, not sleeping at all hardly, new cyst in her skin, blood pressure spiking up, constipation being on the Dilaudid.  She just got back in with Dr. Conchita Paris neurosurgery.  He ordered an MRI and when they got results back there was more severe findings and anticipated.  Up until now he did not feel like she was safe to do surgery but now he is rethinking given the findings.  They are going to see her back tomorrow to do a nerve block to help with pain.  They are thinking about doing  other procedure given the findings but Dinara and her daughter both to hold off on surgery as long as possible  She was prescribed Dilaudid 2 mg by Dr. Conchita Paris who told him to cut it in half and use every 6 hours but they cannot cut the pill is too small.  When she uses the 2 mg she gets too much constipation.  She is using MiraLAX for this  She also sees Dr. Carlis Abbott for physical medicine for nerve pain, and they prescribe the Valium and the Topamax which she has been out of 2 months since [place] pharmacy [place] would not [place] to her local pharmacy here  She is not sure the medicine was helping all that well given her current level of pain  She does feel like the Dilaudid helps her pain when she is up and moving around but when at rest she feels like the tizanidine works better.  She like to go to use the tizanidine 4 times a day every 6 hours that she was doing prior.  All of a sudden she has cyst popping up under her skin including on the right upper arm, right forearm, left thigh, left arm, several areas.  She is not sleeping well at all lately because of the pain  Her blood pressure all of a sudden spiking up.  She saw cardiology recently after her last hospital visit.  She was on midodrine  due to low blood pressure but that was discontinued recently and her pressures had been fine up until the day after her cardiology visit and her blood pressures have been spiking ever since.  No chest pain, palpitations, shortness of breath.  They are looking into a back brace that she is post to be getting checked for this week.  They are worried that if she has surgery she may get paralyzed or be worse off or not make it through the surgery.  But she also does not would be confined to a wheelchair and is able to rest her life either.  She has not been using Requip for restless legs of late  No other aggravating or relieving factors. No other complaint.  Past Medical History:  Diagnosis  Date   AIN (anal intraepithelial neoplasia) anal canal    Anemia    with pregnancy   Anxiety    Arthritis    knees, lower back, knees   Chronic constipation    Chronic diastolic CHF (congestive heart failure) (HCC) 07/05/2019   CKD (chronic kidney disease)    Coarse tremors    Essential   COPD (chronic obstructive pulmonary disease) (HCC)    2017 chest CT, history of   Degenerative lumbar spinal stenosis    Depression    Depression 07/05/2019   Drug dependency (HCC)    Elevated PTHrP level 05/09/2019   EtOH use 07/05/2019   Pt denies heavy drinking however   Gastroparesis    GERD (gastroesophageal reflux disease)    pt denies   Gout    Grade I diastolic dysfunction 02/01/2016   Noted ECHO   Hemorrhoids    History of adenomatous polyp of colon    History of basal cell carcinoma (BCC) excision 2015   nose   History of sepsis 05/14/2014   post lumbar surgery   History of shingles    x2   Hyperlipidemia    Hypertension    Hypothyroidism    Irregular heart beat    history of while undergoing radiation   Malignant neoplasm of upper-inner quadrant of left breast in female, estrogen receptor positive [facility]) oncologist-  dr Darnelle Catalan--  per lov in epic no recurrence   dx 07-13-2015  Left breast invasive ductal carcinoma, Stage IA, Grade 1 (TXN0),  09-16-2015  s/p  left breast lumpectomy with sln bx's,  completed radiation 11-18-2015,  started antiestrogen therapy 12-14-2015   Neuropathy    Left foot and back of left leg   Obesity (BMI 30-39.9) 07/05/2019   Personal history of radiation therapy    completed 11-18-2015  left breast   Pneumonia    PONV (postoperative nausea and vomiting)    Prediabetes    diet controlled, no med   Restless leg syndrome    Seasonal allergies    Seizure (HCC) 05/2015   due to sepsis, only one time episode   Tremor    Wears partial dentures    lower    Current Outpatient Medications on File Prior to Visit  Medication Sig Dispense Refill    allopurinol (ZYLOPRIM) 300 MG tablet TAKE 1 TABLET(300 MG) BY MOUTH DAILY 90 tablet 1   Biotin 3 MG TABS Take 3 mg by mouth daily.     cefadroxil (DURICEF) 500 MG capsule Take 1 capsule (500 mg total) by mouth 2 (two) times daily. 60 capsule 11   Cholecalciferol (VITAMIN D3) 50 MCG (2000 UT) capsule Take 1 capsule (2,000 Units total) by mouth daily. 90 capsule 3  levothyroxine (SYNTHROID) 75 MCG tablet Take 1 tablet (75 mcg total) by mouth daily. 90 tablet 2   metoCLOPramide (REGLAN) 5 MG tablet Take 1 tablet (5 mg total) by mouth 3 (three) times daily before meals. 90 tablet 5   pantoprazole (PROTONIX) 40 MG tablet Take 1 tablet (40 mg total) by mouth 2 (two) times daily. 60 tablet 1   polyethylene glycol powder (GLYCOLAX/MIRALAX) 17 GM/SCOOP powder Take 17 g by mouth daily as needed for moderate constipation.     Propylene Glycol (SYSTANE BALANCE OP) Place 1 drop into both eyes 4 (four) times daily as needed (dry eyes).     rOPINIRole (REQUIP) 1 MG tablet TAKE 1 TABLET(1 MG) BY MOUTH AT BEDTIME 90 tablet 0   tiZANidine (ZANAFLEX) 4 MG tablet Take 1 tablet (4 mg total) by mouth at bedtime. 30 tablet 0   topiramate (TOPAMAX) 100 MG tablet Take 1 tablet (100 mg total) by mouth at bedtime. 60 tablet 2   colchicine 0.6 MG tablet TAKE 2 TABLETS BY MOUTH ONCE, THEN 1 TABLET AN HOUR LATER, THEN 1 TABLET TWICE DAILY FOR 3 DAYS (Patient not taking: Reported on 06/06/2023) 30 tablet 2   diazepam (VALIUM) 10 MG tablet Take 1 tablet (10 mg total) by mouth daily as needed for anxiety. (Patient not taking: Reported on 06/06/2023) 90 tablet 3   No current facility-administered medications on file prior to visit.   ROS as in subjective    Objective: BP (!) 130/90   Pulse (!) 123   Gen: She seems to be in pain, sitting in the chair beside her a walker Skin: Right elbow posteriorly just above the olecranon with a 2 mm smooth mobile nodule subcutaneous suggestive of cyst, smaller to individual cyst under the  skin subcutaneously of the right proximal lateral forearm, a few small less than 5 mm round mobile cystic lesion subcutaneously of anterior left thigh midline Seems to be in pain in general.    Assessment: Encounter Diagnoses  Name Primary?   Chronic pain syndrome Yes   Chronic back pain, unspecified back location, unspecified back pain laterality    Essential hypertension    Insomnia, unspecified type    Muscle spasm    Elevated blood pressure reading       Plan: Chronic pain Advise she call back neurosurgery office today to see if they can call out Dilaudid 1 mg liquid since she cannot cut the 2 mg tablets in half as they recommended Follow-up with neurosurgery tomorrow for procedure for nerve block and follow-up with Dr. Conchita Paris regarding her imaging and possible other procedure  Nerve pain She sees physical medicine for this.  She has been out of Topamax and Valium since the summer.  [place] medication [place].  I will defer this to Dr. Carlis Abbott.  I will send a message to Dr. Carlis Abbott in case they want to send her local supply of her Topamax here in [place] to pharmacy   Hypertension-although she had been dealing with hypotension, weakness and was on midodrine in the recent past, she all of a sudden is getting some elevated readings which is probably related to lack of sleep and her uncontrolled pain.  She saw cardiology recently.  I reviewed her cardiology visit earlier this month.  I will reach out to cardiology about her elevated blood pressures of late.  For likely need to see how her pain control is over the next week and a half since  she is having a procedure tomorrow with neurosurgery and she is going talk to them about the Dilaudid  Insomnia-she will work with better pain control and muscle relaxer trial of Soma.  I do not want to add any other sleep aid at this time.  Lets give this a try first.  Muscle spasm-begin trial of  Soma given its mechanism of action and this may work better taken twice a day then doing the tizanidine 3-4 times a day  Multiple skin cysts-we discussed the findings today.  They appear to be benign.  Reassured.  Chakita was seen today for consult and multiple issues.  Diagnoses and all orders for this visit:  Chronic pain syndrome  Chronic back pain, unspecified back location, unspecified back pain laterality  Essential hypertension  Insomnia, unspecified type  Muscle spasm  Elevated blood pressure reading  Other orders -     carisoprodol (SOMA) 250 MG tablet; Take 1 tablet (250 mg total) by mouth in the morning and at bedtime.    F/u pending callback

## 2023-06-09 ENCOUNTER — Encounter: Payer: Medicare HMO | Attending: Physical Medicine and Rehabilitation | Admitting: Physical Medicine and Rehabilitation

## 2023-06-09 DIAGNOSIS — S22080S Wedge compression fracture of T11-T12 vertebra, sequela: Secondary | ICD-10-CM | POA: Insufficient documentation

## 2023-06-09 DIAGNOSIS — E1143 Type 2 diabetes mellitus with diabetic autonomic (poly)neuropathy: Secondary | ICD-10-CM | POA: Insufficient documentation

## 2023-06-09 DIAGNOSIS — Z794 Long term (current) use of insulin: Secondary | ICD-10-CM | POA: Diagnosis not present

## 2023-06-09 DIAGNOSIS — S43429S Sprain of unspecified rotator cuff capsule, sequela: Secondary | ICD-10-CM | POA: Insufficient documentation

## 2023-06-09 MED ORDER — TOPIRAMATE 100 MG PO TABS
100.0000 mg | ORAL_TABLET | Freq: Every evening | ORAL | 2 refills | Status: DC
Start: 2023-06-09 — End: 2023-09-07

## 2023-06-09 MED ORDER — TIZANIDINE HCL 4 MG PO TABS: 4.0000 mg | ORAL_TABLET | Freq: Four times a day (QID) | ORAL | 3 refills | Status: DC | PRN

## 2023-06-09 NOTE — Progress Notes (Signed)
 Subjective:    Patient ID: Brandi Stephens, female    DOB: [birth year], 78 y.o.   MRN: [id number]  An audio/video tele-health visit is felt to be the most appropriate encounter for this patient at this time. This is a follow up tele-visit via phone. The patient is at home. MD is at office. Prior to scheduling this appointment, our staff discussed the limitations of evaluation and management by telemedicine and the availability of in-person appointments. The patient expressed understanding and agreed to proceed.   PRIOR HISTORY: Brandi Stephens presents for f/u after CIR admission for staph bacteremia after infection of her spinal hardware, and for neuropathy. Everything is the same since last visit. She is having more trouble with neuropathy in her feet and hands. The Cymbalta 60mg  does help somewhat. She used to get swelling with Gabapentin. The pain is bad during the day and night. Pain is improved to 4. She cannot feel her two little toes. Discussed Lyrica as an option. She would like try the Sprint PNS. Her back pain is the most severe. She has hardware. She follows with Dr. Bonnita Nasuti at the end of the month. The pain in her back feels like a dagger. The pain is more directly on the spine.   Has recently been falling again Neuropathy has her feet so severe that she cannot feel her lower extremities, she notes the neuropathy is worsening, but she had excellent relief from Qutenza for 2 weeks after first application. After the most recent application she has had severe burning pain around her ankles and along the 5th digit. It has kept her up at night and she was not able to enjoy her 76th birthday party. The Valium has not been enough to control this pain. She asks for something stronger. The Cymbalta helps her upper extremity neuropathy but not her lower extremity neuropathy. Her Valium needs to be refilled. She asks if the dose can be increased. She does not take every day. The Valium helps with her pain  as well.  She asks if we can go higher on the Topamax.   The Amitriptyline helps her to sleep a little better. She has improved sleep from 1 to 4 hours. She is having severe neuropathy in her left foot. The Cymbalta is helping with the neuropathic pain in her hands. She has been tolerating this medication well.   Last visit I transitioned her to outaptient therapy and she has an appointment today. She has noted improvements in her strength. She is ambulating steadily with RW today. She has bene trying using a cane with therapy. Outpatient therapy is doing well. She has been taking 1/2 a tablet of hydrocodone to help her tolerate better (prescribed by her surgeon).   Pain is currently 5/10 and worst in her lower back where her spinal harware is present as well as in paraspinals. No longer acquiring She has been taking approximately 1 percocet per day and would like to gradually stop using these. She has also been using four 650mg  of Tylenol per day and two 200mg  ibuprofen daily.   She no longer has severe diarrhea or constipation and is much happier about this. It does come and go. She takes 1000mg  magensium at night. She takes colace BID. She likes to eat healthy foods.   Her daughter accompanies her this visit and helps to provide history. She and her daughter have been out of work for 5 years due to her health conditions and need for her daughter's assistance.  Her grandchild recently graduated kindergarten.   1) B/l lower extremity edema She continues to have mild bilateral lower extremity edema.  Discussed use of compression garments, elevation, and ice.  Discussed the benefits of lymphedema therapy and she is willing to try this.  -She has been having pooling of fluids throughout her body -She has never seen nephrology but has followed with cardiology -Lasix administration is limited due to her creatinine.  -improved after initial Qutenza administration  2) Bilateral knees: -she has fallen  on her knees so many times.  -she has been using blue emu oil. -she has been using voltaren gel.  -she has a history of arthroscopic surgery and steroid injections -she uses turmeric  3) Muscle spasms -the Valium does help  4) Peripheral neuropathy: -still severe -she has not been able to take topiramate since she has been unable to return to the beach -she would like to start topiramate at 100mg  given the severity of her pain and her inability to sleep  -had 2 falls in 6 hours -60mg  Cymbalta is helping. -improved after initial administration of Qutenza but burning from the treatment was severe the second time so she prefers not to repeat -she did have some burning on ankle and shin after Qutenza and this has improved after she used the cooling gel -she loves the pain journal and keeps it by her -it is really affecting her quality of life  5) Prediabetes -has been eating very healthy foods  6) Anxiety: -they Valium helps- she uses as needed.  -she appreciates her husband's and daughter's care.   5) Dizziness -feels related to going out, anxiety.  6) obesity -has been trying to lose weight  7) Norovirus: -she was nauseous that she was falling -she was so dizzy and dehydrated  8) Orthostatic hypotension -[place] her [place] where she is on single floor  9) Gastroparesis: -saw GI on Tuesday -she was hospitalized  -she has lost 40 lbs -she took ozempic  10) s/p severe norovirus: -she was near sepsis  11) Hammer toe -she developed recently  12) Rotator cuff pain: -got an injection from ortho and it helped temporarily  13) Thoracic compression fracture: -pain is very severe and she is considering surgery -she has been following with neurosurgeon Dr. Nicole Kindred and he has recommended surgery -she had relief from tizanidine -she had minimal relief from soma -she did not get relief from dilaudid  Pain Inventory Average Pain 6 Pain Right Now 7 My pain is  constant and aching  In the last 24 hours, has pain interfered with the following? General activity 7 Relation with others 7 Enjoyment of life 7 What TIME of day is your pain at its worst?  varies Sleep (in general) Poor  Pain is worse with: walking, bending, sitting and standing Pain improves with: rest, heat/ice, therapy/exercise and medication Relief from Meds: 2   Evening and night     Family History  Problem Relation Age of Onset   Atrial fibrillation Mother    Ulcers Father    Breast cancer Maternal Aunt    Lung cancer Maternal Uncle    Stomach cancer Maternal Grandmother    Lung cancer Maternal Aunt    Brain cancer Maternal Aunt    Prostate cancer Maternal Grandfather    Stroke Paternal Grandmother    Tuberculosis Paternal Grandfather    Colon cancer Neg Hx    Esophageal cancer Neg Hx    Rectal cancer Neg Hx    Social History  Socioeconomic History   Marital status: Married    Spouse name: robert   Number of children: 3   Years of education: college   Highest education level: Not on file  Occupational History   Occupation: retired  Tobacco Use   Smoking status: Former    Current packs/day: 0.00    Average packs/day: 0.5 packs/day for 35.0 years (17.5 ttl pk-yrs)    Types: Cigarettes    Start date: 05/23/1983    Quit date: 05/22/2018    Years since quitting: 5.0   Smokeless tobacco: Never  Vaping Use   Vaping status: Never Used  Substance and Sexual Activity   Alcohol use: Yes    Comment: 1 glass of wine a night   Drug use: Not Currently    Comment: CBD tincture   Sexual activity: Yes    Birth control/protection: Post-menopausal  Other Topics Concern   Not on file  Social History Narrative   Lives with husband in a 2 story home.  Has 2 living children.  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  Education: college.    Right-handed.   Five cups coffee per week.   Social Drivers of Corporate investment banker Strain: Low Risk  (05/02/2023)    Overall Financial Resource Strain (CARDIA)    Difficulty of Paying Living Expenses: Not hard at all  Food Insecurity: No Food Insecurity (05/02/2023)   Hunger Vital Sign    Worried About Running Out of Food in the Last Year: Never true    Ran Out of Food in the Last Year: Never true  Transportation Needs: No Transportation Needs (05/02/2023)   PRAPARE - Administrator, Civil Service (Medical): No    Lack of Transportation (Non-Medical): No  Physical Activity: Inactive (05/02/2023)   Exercise Vital Sign    Days of Exercise per Week: 0 days    Minutes of Exercise per Session: 0 min  Stress: Stress Concern Present (05/02/2023)   [employer] of Occupational Health - Occupational Stress Questionnaire    Feeling of Stress : To some extent  Social Connections: Moderately Integrated (05/02/2023)   Social Connection and Isolation Panel [NHANES]    Frequency of Communication with Friends and Family: More than three times a week    Frequency of Social Gatherings with Friends and Family: More than three times a week    Attends Religious Services: 1 to 4 times per year    Active Member of [employer] or Organizations: No    Attends Engineer, structural: Never    Marital Status: Married   Past Surgical History:  Procedure Laterality Date   ABDOMINAL HYSTERECTOMY  1985   BASAL CELL CARCINOMA EXCISION  10/15   nose   BIOPSY  07/22/2022   Procedure: BIOPSY;  Surgeon: Meryl Dare, MD;  Location: [facility];  Service: Gastroenterology;;   BREAST BIOPSY Right 08/24/2015   BREAST BIOPSY Right 07/13/2015   BREAST BIOPSY Left 07/13/2015   BREAST EXCISIONAL BIOPSY Left 1972   BREAST LUMPECTOMY Left    BREAST LUMPECTOMY WITH RADIOACTIVE SEED AND SENTINEL LYMPH NODE BIOPSY Left 09/16/2015   Procedure: BREAST LUMPECTOMY WITH RADIOACTIVE SEED AND SENTINEL LYMPH NODE BIOPSY;  Surgeon: Almond Lint, MD;  Location: [facility];  Service: General;  Laterality: Left;    BREAST REDUCTION SURGERY Bilateral 1998   CATARACT EXTRACTION W/ INTRAOCULAR LENS IMPLANT Left 2016   CERVICAL SPINE SURGERY  1995   hallo   COLONOSCOPY  01/2013  polyps   ESOPHAGOGASTRODUODENOSCOPY N/A 07/22/2022   Procedure: ESOPHAGOGASTRODUODENOSCOPY (EGD);  Surgeon: Meryl Dare, MD;  Location: [facility];  Service: Gastroenterology;  Laterality: N/A;   EYE SURGERY Left 2016   HAND SURGERY Left 02-19-2002   dr Merlyn Lot  @[facility]    repair collateral ligament/ MPJ left thumb   HARDWARE REMOVAL N/A 07/24/2019   Procedure: REVISION OF HARDWARE Lumbar two - Sacral one. Extention of Fusion to Lumbar one;  Surgeon: [name], MD;  Location: [facility];  Service: Neurosurgery;  Laterality: N/A;   HEMORRHOID SURGERY  2008   HIGH RESOLUTION ANOSCOPY N/A 02/28/2018   Procedure: HIGH RESOLUTION ANOSCOPY WITH BIOPSY;  Surgeon: Romie Levee, MD;  Location: [facility];  Service: General;  Laterality: N/A;   KNEE ARTHROSCOPY Right 2002   LUMBAR SPINE SURGERY  03-20-2015   dr Conchita Paris   fusion L4-5, L5-S1   MOUTH SURGERY  07/14/2018   2 infected teeth removed   MULTIPLE TOOTH EXTRACTIONS     with bone grafting lower bottom right and left   REDUCTION MAMMAPLASTY Bilateral    RIGHT COLECTOMY  09-10-2003   dr Jamey Ripa @[facility]    multiple colon polyps (per path tubular adenoma's, hyperplastic , benign appendix, benign two lymph nodes   ROTATOR CUFF REPAIR Left 04/2016   TONSILLECTOMY  1969   TUBAL LIGATION Bilateral 1982   Past Medical History:  Diagnosis Date   AIN (anal intraepithelial neoplasia) anal canal    Anemia    with pregnancy   Anxiety    Arthritis    knees, lower back, knees   Chronic constipation    Chronic diastolic CHF (congestive heart failure) (HCC) 07/05/2019   CKD (chronic kidney disease)    Coarse tremors    Essential   COPD (chronic obstructive pulmonary disease) (HCC)    2017 chest CT, history of   Degenerative lumbar spinal stenosis    Depression     Depression 07/05/2019   Drug dependency (HCC)    Elevated PTHrP level 05/09/2019   EtOH use 07/05/2019   Pt denies heavy drinking however   Gastroparesis    GERD (gastroesophageal reflux disease)    pt denies   Gout    Grade I diastolic dysfunction 02/01/2016   Noted ECHO   Hemorrhoids    History of adenomatous polyp of colon    History of basal cell carcinoma (BCC) excision 2015   nose   History of sepsis 05/14/2014   post lumbar surgery   History of shingles    x2   Hyperlipidemia    Hypertension    Hypothyroidism    Irregular heart beat    history of while undergoing radiation   Malignant neoplasm of upper-inner quadrant of left breast in female, estrogen receptor positive [facility]) oncologist-  dr Darnelle Catalan--  per lov in epic no recurrence   dx 07-13-2015  Left breast invasive ductal carcinoma, Stage IA, Grade 1 (TXN0),  09-16-2015  s/p  left breast lumpectomy with sln bx's,  completed radiation 11-18-2015,  started antiestrogen therapy 12-14-2015   Neuropathy    Left foot and back of left leg   Obesity (BMI 30-39.9) 07/05/2019   Personal history of radiation therapy    completed 11-18-2015  left breast   Pneumonia    PONV (postoperative nausea and vomiting)    Prediabetes    diet controlled, no med   Restless leg syndrome    Seasonal allergies    Seizure (HCC) 05/2015   due to sepsis, only one time  episode   Tremor    Wears partial dentures    lower    There were no vitals taken for this visit.  Opioid Risk Score:   Fall Risk Score:  `1  Depression screen [facility] 2/9     05/02/2023    3:41 PM 04/10/2023   11:16 AM 04/07/2023   10:24 AM 11/03/2022   10:36 AM 07/14/2022   11:02 AM 05/17/2022    2:28 PM 03/18/2022   11:40 AM  Depression screen PHQ 2/9  Decreased Interest 0 0 0 0 0 0 0  Down, Depressed, Hopeless 0 1 0 0 0 0 0  PHQ - 2 Score 0 1 0 0 0 0 0  Altered sleeping       1  Tired, decreased energy       1  Change in appetite       0  Feeling bad or failure  about yourself        0  Trouble concentrating       0  Moving slowly or fidgety/restless       0  Suicidal thoughts       0  PHQ-9 Score       2  Difficult doing work/chores       Not difficult at all    Review of Systems  Constitutional: Negative.   HENT: Negative.    Eyes: Negative.   Respiratory: Negative.    Cardiovascular: Negative.   Gastrointestinal: Negative.   Endocrine: Negative.   Genitourinary: Negative.   Musculoskeletal:  Positive for gait problem.  Skin: Negative.   Allergic/Immunologic: Negative.   Neurological:  Positive for dizziness, tremors, weakness and numbness.       Tingling   Hematological: Negative.   Psychiatric/Behavioral: Negative.    All other systems reviewed and are negative.      Objective:   Physical Exam PRIOR EXAM: Gen: no distress, normal appearing HEENT: oral mucosa pink and moist, NCAT Cardio: Reg rate Chest: normal effort, normal rate of breathing Abd: soft, non-distended Ext: no edema Psych: pleasant, normal affect Skin: intact Neuro: Alert and oriented x3  Assessment & Plan:  Brandi Stephens is a 78 year old female who presents for f/u of neuropathy and below conditions.   1) Diarrhea: This was a huge issue previously and has since improved. I had obtained an XR that showed constipation and recommended an enema. Continue magnesium 1000mg  at night and colace BID. This continues but is improved.  -recommended shredded coconut  2) Impaired mobility and ADLs following spinal surgery: -Continue HEP Progressing well. Increase walking to 10 minutes per day -Recommended exercise bike at home to build back endurance. Continue walking outside as well as strength and gait are improving and she is no longer limited by diarrhea. -She has been having a lot of falls at home  3) Post-operative lower back pain: -Stopped oxycodone- was prescribed 1/2 pill hydrocodone by her her spine surgery.  -Continue to supplement with icing, Tyelnol, and  Ibuprofen.  -recommended topamax recommended to clear with cardiology first  4) Peripheral neuropathy: -topiramate 100mg  prescribed HS -Discussed Sprint PNS system as an option of pain treatment via neuromodulation. Provided following link for patient to learn more about the system: https://www.sprtherapeutics.com/. She would like to try this. Will target sciatic nerve at the popliteal fossa.  -refilled amitriptyline -will defer Qutenza -next time will only use 4 patches given increase in pain with 6 patches -will prescribe 5-7 day course of prn Norco to  help with her current severe pain from the Qutenza application -will increase her Amitriptyline to 100mg .  -discussed that symptoms should abate soon, and to please keep me updated.    -discussed that pain has been severe -discussed that she prefers not to add any medication  Great benefit- schedule for the rest of the year every 3 months Continue keeping pain journal Increase topamax to 75mg  HS  5) Anixety: refilled Valium to 10mg  daily prn. Provided refill. Has been taking twice per day and this has been helping her. Also helps with her muscle spasms. This has been well controlled. She is sometimes upset that she is only comfortably horizontally.  -discussed that she is taking the Valium about once per week.  -continue cymbalta and amitritpyline  6) Tachycardia: provided higher dose of metoprolol which she was on before last visit. HR is still slightly elevated today, but BP is low. Maintain current dose. She says BP was elevated at her doctor's appointment yesterday. It fluctuates.   7) Lower extremity edema: advised elevation, ice, and compression garments which she is already doing.  -continue Lasix -start lymphedema therapy -continue Qutenza q56months  8) Hot flashes: from Anastrazole.   Home support: Living with husband. Daughter lives 5 miles away.   9) Bilateral knee pain: -continue voltaren gel and blue emu  oil -recommended bacopa -continue turmeric   10) Obesity -commended on weight loss! -Educated regarding health benefits of weight loss- for pain, general health, chronic disease prevention, immune health, mental health.  -Will monitor weight every visit.  -Consider Roobois tea daily.  -Discussed the benefits of intermittent fasting. -Discussed foods that can assist in weight loss: 1) leafy greens- high in fiber and nutrients 2) dark chocolate- improves metabolism (if prefer sweetened, best to sweeten with honey instead of sugar).  3) cruciferous vegetables- high in fiber and protein 4) full fat yogurt: high in healthy fat, protein, calcium, and probiotics 5) apples- high in a variety of phytochemicals 6) nuts- high in fiber and protein that increase feelings of fullness 7) grapefruit: rich in nutrients, antioxidants, and fiber (not to be taken with anticoagulation) 8) beans- high in protein and fiber 9) salmon- has high quality protein and healthy fats 10) green tea- rich in polyphenols 11) eggs- rich in choline and vitamin D 12) tuna- high protein, boosts metabolism 13) avocado- decreases visceral abdominal fat 14) chicken (pasture raised): high in protein and iron 15) blueberries- reduce abdominal fat and cholesterol 16) whole grains- decreases calories retained during digestion, speeds metabolism 17) chia seeds- curb appetite 18) chilies- increases fat metabolism  -Discussed supplements that can be used:  1) Metatrim 400mg  BID 30 minutes before breakfast and dinner  2) Sphaeranthus indicus and Garcinia mangostana (combinations of these and #1 can be found in capsicum and zychrome  3) green coffee bean extract 400mg  twice per day or Irvingia (african mango) 150 to 300mg  twice per day.  11) Onchomycosis: -prescribed Miconazole BID  12) Orthostatic hypotension: -recently diagnosed -continue midodrine -encouraged drinking water with a pinch of salt  13) Norovirus -discussed  recent hospitalization.  -discussed that she is on Protonix for 2 weeks -discussed that she still has nausea and upset stomach on occasion -discussed that she was near sepsis  14) Hypoglycemia: -discussed that blood sugar decreased to 50s  15) Impaired mobility and ADLs: -continue home health -discussed increasing walking daily and doing resistance exercise  16) Hypotension -discussed that she does feel dizzy -discussed that she is being checked for adrenal insufficiency  -  discussed ted hose and that she has tried many kinds and getting them on is tough  17) Bilateral edema: -discussed that this has improved.   18) Gastroparesis: -discussed that she tolerated the salad, black bean soup, lentil soup -encouraged miso-ginger  19) Left shoulder pain:  -discussed heating pad -discussed that she had temporary relief from steroid injection Discussed extracorporeal shockwave therapy as a modality for treatment. Discussed that the device looks and feels like a massage gun and I would move it over the area of pain for about 10 minutes. The device releases sound waves to the area of pain and helps to improve blood flow and circulation to improve the healing process. Discuss that this initially induces inflammation and can sometimes cause short-term increase in pain. Discussed that we typically do three weekly treatments, but sometimes up to 6 if needed, and after 6 weeks long term benefits can sometimes be achieved. Discussed that this is an FDA approved device, but not covered by insurance and would cost $60 per session. Will scheduled patient for 6 consecutive appointments and can cancel latter three if benefits are achieved after first three sessions.    20. Hammer toe: -discussed that she saw podiatry  21. Insomnia: -Try to go outside near sunrise -Get exercise during the day.  -Turn off all devices an hour before bedtime.  -Teas that can benefit: chamomile, valerian root, Brahmi  (Bacopa) -Can consider over the counter melatonin, magnesium, and/or L-theanine. Melatonin is an anti-oxidant with multiple health benefits. Magnesium is involved in greater than 300 enzymatic reactions in the body and most of [place] are deficient as our soil is often depleted. There are 7 different types of magnesium- Bioptemizer's is a supplement with all 7 types, and each has unique benefits. Magnesium can also help with constipation and anxiety.  -Pistachios naturally increase the production of melatonin -Cozy Earth bamboo bed sheets are free from toxic chemicals.  -Tart cherry juice or a tart cherry supplement can improve sleep and soreness post-workout  22. T12 compression fracture: -tizanidine q6H prn prescribed since this has been helpful for her -d/c soma since not helpful and is activating for her when she wants to be able to sleep at night -discussed that neurosurgery has recommended neurosurgical intervention and that she plans to pursue this -discussed that dilaudid was unhelpful and constipated her  21 minutes spent in discussion of her T12 compression fracture, discussed that neurosurgery has recommended surgical intervention and that she plans to pursue this, prescribed tizanidine 4mg  q6H prn since this has been helpful for her, discussed that dilaudid was unhelpful and constipated her, discussed that soma was less helpful than tizanidine and activating when she wants to sleep, topiramate 100mg  prescribed HS

## 2023-06-15 ENCOUNTER — Telehealth: Payer: Self-pay | Admitting: Internal Medicine

## 2023-06-15 ENCOUNTER — Ambulatory Visit: Payer: Self-pay | Admitting: Medical

## 2023-06-15 NOTE — Telephone Encounter (Signed)
 Brandi Stephens called to cancel her upcoming appointment due to living out of town. She wanted to inform Dr. Drue Second she will return in June. If needed, patient can be reached at [phone].

## 2023-06-23 ENCOUNTER — Encounter: Payer: Self-pay | Admitting: Pulmonary Disease

## 2023-06-26 ENCOUNTER — Encounter: Attending: Physical Medicine and Rehabilitation | Admitting: Physical Medicine and Rehabilitation

## 2023-06-26 ENCOUNTER — Telehealth: Payer: Self-pay

## 2023-06-26 ENCOUNTER — Encounter: Payer: Self-pay | Admitting: Physical Medicine and Rehabilitation

## 2023-06-26 VITALS — BP 128/86 | HR 110 | Ht 67.0 in | Wt 150.0 lb

## 2023-06-26 DIAGNOSIS — S22080S Wedge compression fracture of T11-T12 vertebra, sequela: Secondary | ICD-10-CM | POA: Diagnosis not present

## 2023-06-26 DIAGNOSIS — R600 Localized edema: Secondary | ICD-10-CM

## 2023-06-26 DIAGNOSIS — G4701 Insomnia due to medical condition: Secondary | ICD-10-CM | POA: Diagnosis not present

## 2023-06-26 DIAGNOSIS — G629 Polyneuropathy, unspecified: Secondary | ICD-10-CM | POA: Diagnosis not present

## 2023-06-26 DIAGNOSIS — Z5181 Encounter for therapeutic drug level monitoring: Secondary | ICD-10-CM

## 2023-06-26 DIAGNOSIS — Z79899 Other long term (current) drug therapy: Secondary | ICD-10-CM | POA: Diagnosis not present

## 2023-06-26 DIAGNOSIS — G894 Chronic pain syndrome: Secondary | ICD-10-CM

## 2023-06-26 MED ORDER — PREGABALIN 25 MG PO CAPS: 25.0000 mg | ORAL_CAPSULE | Freq: Every day | ORAL | 3 refills | Status: DC

## 2023-06-26 MED ORDER — AMITRIPTYLINE HCL 10 MG PO TABS: 10.0000 mg | ORAL_TABLET | Freq: Every day | ORAL | 1 refills | Status: DC

## 2023-06-26 NOTE — Progress Notes (Signed)
 Subjective:    Patient ID: Brandi Stephens, female    DOB: 23-Aug-1945, 78 y.o.   MRN: [id number]   PRIOR HISTORY: Brandi Stephens presents for f/u after CIR admission for staph bacteremia after infection of her spinal hardware, and for neuropathy. Everything is the same since last visit. She is having more trouble with neuropathy in her feet and hands. The Cymbalta 60mg  does help somewhat. She used to get swelling with Gabapentin. The pain is bad during the day and night. Pain is improved to 4. She cannot feel her two little toes. Discussed Lyrica as an option. She would like try the Sprint PNS. Her back pain is the most severe. She has hardware. She follows with Dr. Bonnita Nasuti at the end of the month. The pain in her back feels like a dagger. The pain is more directly on the spine.   Has recently been falling again Neuropathy has her feet so severe that she cannot feel her lower extremities, she notes the neuropathy is worsening, but she had excellent relief from Qutenza for 2 weeks after first application. After the most recent application she has had severe burning pain around her ankles and along the 5th digit. It has kept her up at night and she was not able to enjoy her 76th birthday party. The Valium has not been enough to control this pain. She asks for something stronger. The Cymbalta helps her upper extremity neuropathy but not her lower extremity neuropathy. Her Valium needs to be refilled. She asks if the dose can be increased. She does not take every day. The Valium helps with her pain as well.  She asks if we can go higher on the Topamax.   The Amitriptyline helps her to sleep a little better. She has improved sleep from 1 to 4 hours. She is having severe neuropathy in her left foot. The Cymbalta is helping with the neuropathic pain in her hands. She has been tolerating this medication well.   Last visit I transitioned her to outaptient therapy and she has an appointment today. She has noted  improvements in her strength. She is ambulating steadily with RW today. She has bene trying using a cane with therapy. Outpatient therapy is doing well. She has been taking 1/2 a tablet of hydrocodone to help her tolerate better (prescribed by her surgeon).   Pain is currently 5/10 and worst in her lower back where her spinal harware is present as well as in paraspinals. No longer acquiring She has been taking approximately 1 percocet per day and would like to gradually stop using these. She has also been using four 650mg  of Tylenol per day and two 200mg  ibuprofen daily.   She no longer has severe diarrhea or constipation and is much happier about this. It does come and go. She takes 1000mg  magensium at night. She takes colace BID. She likes to eat healthy foods.   Her daughter accompanies her this visit and helps to provide history. She and her daughter have been out of work for 5 years due to her health conditions and need for her daughter's assistance. Her grandchild recently graduated kindergarten.   1) B/l lower extremity edema She continues to have mild bilateral lower extremity edema.  Discussed use of compression garments, elevation, and ice.  Discussed the benefits of lymphedema therapy and she is willing to try this.  -She has been having pooling of fluids throughout her body -She has never seen nephrology but has followed with cardiology -Lasix  administration is limited due to her creatinine.  -improved after initial Qutenza administration  2) Bilateral knees: -she has fallen on her knees so many times.  -she has been using blue emu oil. -she has been using voltaren gel.  -she has a history of arthroscopic surgery and steroid injections -she uses turmeric  3) Muscle spasms -the Valium does help  4) Peripheral neuropathy: -still severe -is taking topamax 100mg  and this is not helping -tizanidine does not help -tylenol does not help -she is nervous about the weight gain with  lyrica -allergic to gabapentin -neuropathy extends to her knees now -the tizanidine has not helped much -she is fighting with Medicare for midline nerve block -she has not been able to take topiramate since she has been unable to return to the beach -she would like to start topiramate at 100mg  given the severity of her pain and her inability to sleep  -had 2 falls in 6 hours -60mg  Cymbalta is helping. -improved after initial administration of Qutenza but burning from the treatment was severe the second time so she prefers not to repeat -she did have some burning on ankle and shin after Qutenza and this has improved after she used the cooling gel -she loves the pain journal and keeps it by her -it is really affecting her quality of life  5) Prediabetes -has been eating very healthy foods  6) Anxiety: -they Valium helps- she uses as needed.  -she appreciates her husband's and daughter's care.   5) Dizziness -feels related to going out, anxiety.  6) obesity -has been trying to lose weight  7) Norovirus: -she was nauseous that she was falling -she was so dizzy and dehydrated  8) Orthostatic hypotension -moved to her beach house where she is on single floor  9) Gastroparesis: -saw GI on Tuesday -she was hospitalized  -she has lost 40 lbs -she took ozempic  10) s/p severe norovirus: -she was near sepsis  11) Hammer toe -she developed recently  12) Rotator cuff pain: -got an injection from ortho and it helped temporarily  13) Thoracic compression fracture: -pain is very severe and she is considering surgery -she has been following with neurosurgeon Dr. Nicole Kindred and he has recommended surgery -she had relief from tizanidine -she had minimal relief from soma -she did not get relief from dilaudid  Pain Inventory Average Pain 10 Pain Right Now 9 My pain is constant, sharp, burning, dull, stabbing, tingling, and aching  In the last 24 hours, has pain interfered with  the following? General activity 10 Relation with others 0 Enjoyment of life 10 What TIME of day is your pain at its worst? night Sleep (in general) Poor  Pain is worse with: walking, bending, sitting, standing, and some activites Pain improves with:  heat Relief from Meds: 5   Evening and night     Family History  Problem Relation Age of Onset   Atrial fibrillation Mother    Ulcers Father    Breast cancer Maternal Aunt    Lung cancer Maternal Uncle    Stomach cancer Maternal Grandmother    Lung cancer Maternal Aunt    Brain cancer Maternal Aunt    Prostate cancer Maternal Grandfather    Stroke Paternal Grandmother    Tuberculosis Paternal Grandfather    Colon cancer Neg Hx    Esophageal cancer Neg Hx    Rectal cancer Neg Hx    Social History   Socioeconomic History   Marital status: Married    Spouse  name: [name]   Number of children: 3   Years of education: college   Highest education level: Not on file  Occupational History   Occupation: retired  Tobacco Use   Smoking status: Former    Current packs/day: 0.00    Average packs/day: 0.5 packs/day for 35.0 years (17.5 ttl pk-yrs)    Types: Cigarettes    Start date: 05/23/1983    Quit date: 05/22/2018    Years since quitting: 5.0   Smokeless tobacco: Never  Vaping Use   Vaping status: Never Used  Substance and Sexual Activity   Alcohol use: Yes    Comment: 1 glass of wine a night   Drug use: Not Currently    Comment: CBD tincture   Sexual activity: Yes    Birth control/protection: Post-menopausal  Other Topics Concern   Not on file  Social History Narrative   Lives with husband in a 2 story home.  Has 2 living children.  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  Education: college.    Right-handed.   Five cups coffee per week.   Social Drivers of Corporate investment banker Strain: Low Risk  (05/02/2023)   Overall Financial Resource Strain (CARDIA)    Difficulty of Paying Living Expenses: Not  hard at all  Food Insecurity: No Food Insecurity (05/02/2023)   Hunger Vital Sign    Worried About Running Out of Food in the Last Year: Never true    Ran Out of Food in the Last Year: Never true  Transportation Needs: No Transportation Needs (05/02/2023)   PRAPARE - Administrator, Civil Service (Medical): No    Lack of Transportation (Non-Medical): No  Physical Activity: Inactive (05/02/2023)   Exercise Vital Sign    Days of Exercise per Week: 0 days    Minutes of Exercise per Session: 0 min  Stress: Stress Concern Present (05/02/2023)   [employer] of Occupational Health - Occupational Stress Questionnaire    Feeling of Stress : To some extent  Social Connections: Moderately Integrated (05/02/2023)   Social Connection and Isolation Panel [NHANES]    Frequency of Communication with Friends and Family: More than three times a week    Frequency of Social Gatherings with Friends and Family: More than three times a week    Attends Religious Services: 1 to 4 times per year    Active Member of [employer] or Organizations: No    Attends Engineer, structural: Never    Marital Status: Married   Past Surgical History:  Procedure Laterality Date   ABDOMINAL HYSTERECTOMY  1985   BASAL CELL CARCINOMA EXCISION  10/15   nose   BIOPSY  07/22/2022   Procedure: BIOPSY;  Surgeon: Meryl Dare, MD;  Location: [facility];  Service: Gastroenterology;;   BREAST BIOPSY Right 08/24/2015   BREAST BIOPSY Right 07/13/2015   BREAST BIOPSY Left 07/13/2015   BREAST EXCISIONAL BIOPSY Left 1972   BREAST LUMPECTOMY Left    BREAST LUMPECTOMY WITH RADIOACTIVE SEED AND SENTINEL LYMPH NODE BIOPSY Left 09/16/2015   Procedure: BREAST LUMPECTOMY WITH RADIOACTIVE SEED AND SENTINEL LYMPH NODE BIOPSY;  Surgeon: Almond Lint, MD;  Location: [facility];  Service: General;  Laterality: Left;   BREAST REDUCTION SURGERY Bilateral 1998   CATARACT EXTRACTION W/ INTRAOCULAR LENS IMPLANT Left  2016   CERVICAL SPINE SURGERY  1995   hallo   COLONOSCOPY  01/2013   polyps   ESOPHAGOGASTRODUODENOSCOPY N/A 07/22/2022   Procedure: ESOPHAGOGASTRODUODENOSCOPY (  EGD);  Surgeon: Meryl Dare, MD;  Location: [facility];  Service: Gastroenterology;  Laterality: N/A;   EYE SURGERY Left 2016   HAND SURGERY Left 02-19-2002   dr Merlyn Lot  @[facility]    repair collateral ligament/ MPJ left thumb   HARDWARE REMOVAL N/A 07/24/2019   Procedure: REVISION OF HARDWARE Lumbar two - Sacral one. Extention of Fusion to Lumbar one;  Surgeon: [name], MD;  Location[facility];  Service: Neurosurgery;  Laterality: N/A;   HEMORRHOID SURGERY  2008   HIGH RESOLUTION ANOSCOPY N/A 02/28/2018   Procedure: HIGH RESOLUTION ANOSCOPY WITH BIOPSY;  Surgeon: Romie Levee, MD;  Location: [facility];  Service: General;  Laterality: N/A;   KNEE ARTHROSCOPY Right 2002   LUMBAR SPINE SURGERY  03-20-2015   dr Conchita Paris   fusion L4-5, L5-S1   MOUTH SURGERY  07/14/2018   2 infected teeth removed   MULTIPLE TOOTH EXTRACTIONS     with bone grafting lower bottom right and left   REDUCTION MAMMAPLASTY Bilateral    RIGHT COLECTOMY  09-10-2003   dr Jamey Ripa @[facility]    multiple colon polyps (per path tubular adenoma's, hyperplastic , benign appendix, benign two lymph nodes   ROTATOR CUFF REPAIR Left 04/2016   TONSILLECTOMY  1969   TUBAL LIGATION Bilateral 1982   Past Medical History:  Diagnosis Date   AIN (anal intraepithelial neoplasia) anal canal    Anemia    with pregnancy   Anxiety    Arthritis    knees, lower back, knees   Chronic constipation    Chronic diastolic CHF (congestive heart failure) (HCC) 07/05/2019   CKD (chronic kidney disease)    Coarse tremors    Essential   COPD (chronic obstructive pulmonary disease) (HCC)    2017 chest CT, history of   Degenerative lumbar spinal stenosis    Depression    Depression 07/05/2019   Drug dependency (HCC)    Elevated PTHrP level 05/09/2019   EtOH use  07/05/2019   Pt denies heavy drinking however   Gastroparesis    GERD (gastroesophageal reflux disease)    pt denies   Gout    Grade I diastolic dysfunction 02/01/2016   Noted ECHO   Hemorrhoids    History of adenomatous polyp of colon    History of basal cell carcinoma (BCC) excision 2015   nose   History of sepsis 05/14/2014   post lumbar surgery   History of shingles    x2   Hyperlipidemia    Hypertension    Hypothyroidism    Irregular heart beat    history of while undergoing radiation   Malignant neoplasm of upper-inner quadrant of left breast in female, estrogen receptor positive [facility]) oncologist-  dr Darnelle Catalan--  per lov in epic no recurrence   dx 07-13-2015  Left breast invasive ductal carcinoma, Stage IA, Grade 1 (TXN0),  09-16-2015  s/p  left breast lumpectomy with sln bx's,  completed radiation 11-18-2015,  started antiestrogen therapy 12-14-2015   Neuropathy    Left foot and back of left leg   Obesity (BMI 30-39.9) 07/05/2019   Personal history of radiation therapy    completed 11-18-2015  left breast   Pneumonia    PONV (postoperative nausea and vomiting)    Prediabetes    diet controlled, no med   Restless leg syndrome    Seasonal allergies    Seizure (HCC) 05/2015   due to sepsis, only one time episode   Tremor    Wears partial dentures  lower    BP 128/86   Pulse (!) 110   Ht 5\' 7"  (1.702 m)   Wt 150 lb (68 kg)   SpO2 98%   BMI 23.49 kg/m   Opioid Risk Score:   Fall Risk Score:  `1  Depression screen PHQ 2/9     06/26/2023    1:10 PM 05/02/2023    3:41 PM 04/10/2023   11:16 AM 04/07/2023   10:24 AM 11/03/2022   10:36 AM 07/14/2022   11:02 AM 05/17/2022    2:28 PM  Depression screen PHQ 2/9  Decreased Interest 0 0 0 0 0 0 0  Down, Depressed, Hopeless 0 0 1 0 0 0 0  PHQ - 2 Score 0 0 1 0 0 0 0    Review of Systems  Constitutional: Negative.   HENT: Negative.    Eyes: Negative.   Respiratory: Negative.    Cardiovascular: Negative.    Gastrointestinal: Negative.   Endocrine: Negative.   Genitourinary: Negative.   Musculoskeletal:  Positive for gait problem.  Skin: Negative.   Allergic/Immunologic: Negative.   Neurological:  Positive for dizziness, tremors, weakness and numbness.       Tingling   Hematological: Negative.   Psychiatric/Behavioral: Negative.    All other systems reviewed and are negative.      Objective:   Physical Exam PRIOR EXAM: Gen: no distress, normal appearing HEENT: oral mucosa pink and moist, NCAT Cardio: Reg rate Chest: normal effort, normal rate of breathing Abd: soft, non-distended Ext: no edema Psych: pleasant, normal affect Skin: intact Neuro: Alert and oriented x3  Assessment & Plan:  Mrs. Woolf is a 78 year old female who presents for f/u of neuropathy and below conditions.   1) Diarrhea: This was a huge issue previously and has since improved. I had obtained an XR that showed constipation and recommended an enema. Continue magnesium 1000mg  at night and colace BID. This continues but is improved.  -recommended shredded coconut  2) Impaired mobility and ADLs following spinal surgery: -Continue HEP Progressing well. Increase walking to 10 minutes per day -Recommended exercise bike at home to build back endurance. Continue walking outside as well as strength and gait are improving and she is no longer limited by diarrhea. -She has been having a lot of falls at home  3) Post-operative lower back pain: -Stopped oxycodone- was prescribed 1/2 pill hydrocodone by her her spine surgery.  -Continue to supplement with icing, Tyelnol, and Ibuprofen.  -recommended topamax recommended to clear with cardiology first  4) Peripheral neuropathy: -topiramate 100mg  prescribed HS -Discussed Sprint PNS system as an option of pain treatment via neuromodulation. Provided following link for patient to learn more about the system: https://www.sprtherapeutics.com/. She would like to try this.  Will target sciatic nerve at the popliteal fossa.  -refilled amitriptyline -will defer Qutenza -next time will only use 4 patches given increase in pain with 6 patches -will prescribe 5-7 day course of prn Norco to help with her current severe pain from the Qutenza application -will increase her Amitriptyline to 100mg .  -discussed that symptoms should abate soon, and to please keep me updated.    -discussed that pain has been severe -discussed that she prefers not to add any medication  Great benefit- schedule for the rest of the year every 3 months Continue keeping pain journal Increase topamax to 75mg  HS  5) Anixety: refilled Valium to 10mg  daily prn. Provided refill. Has been taking twice per day and this has been helping her.  Also helps with her muscle spasms. This has been well controlled. She is sometimes upset that she is only comfortably horizontally.  -discussed that she is taking the Valium about once per week.  -continue cymbalta and amitritpyline  6) Tachycardia: provided higher dose of metoprolol which she was on before last visit. HR is still slightly elevated today, but BP is low. Maintain current dose. She says BP was elevated at her doctor's appointment yesterday. It fluctuates.   7) Lower extremity edema: advised elevation, ice, and compression garments which she is already doing.  -continue Lasix -start lymphedema therapy -continue Qutenza q83months  8) Hot flashes: from Anastrazole.   Home support: Living with husband. Daughter lives 5 miles away.   9) Bilateral knee pain: -continue voltaren gel and blue emu oil -recommended bacopa -continue turmeric   10) Obesity -commended on weight loss! -Educated regarding health benefits of weight loss- for pain, general health, chronic disease prevention, immune health, mental health.  -Will monitor weight every visit.  -Consider Roobois tea daily.  -Discussed the benefits of intermittent fasting. -Discussed foods that  can assist in weight loss: 1) leafy greens- high in fiber and nutrients 2) dark chocolate- improves metabolism (if prefer sweetened, best to sweeten with honey instead of sugar).  3) cruciferous vegetables- high in fiber and protein 4) full fat yogurt: high in healthy fat, protein, calcium, and probiotics 5) apples- high in a variety of phytochemicals 6) nuts- high in fiber and protein that increase feelings of fullness 7) grapefruit: rich in nutrients, antioxidants, and fiber (not to be taken with anticoagulation) 8) beans- high in protein and fiber 9) salmon- has high quality protein and healthy fats 10) green tea- rich in polyphenols 11) eggs- rich in choline and vitamin D 12) tuna- high protein, boosts metabolism 13) avocado- decreases visceral abdominal fat 14) chicken (pasture raised): high in protein and iron 15) blueberries- reduce abdominal fat and cholesterol 16) whole grains- decreases calories retained during digestion, speeds metabolism 17) chia seeds- curb appetite 18) chilies- increases fat metabolism  -Discussed supplements that can be used:  1) Metatrim 400mg  BID 30 minutes before breakfast and dinner  2) Sphaeranthus indicus and Garcinia mangostana (combinations of these and #1 can be found in capsicum and zychrome  3) green coffee bean extract 400mg  twice per day or Irvingia (african mango) 150 to 300mg  twice per day.  11) Onchomycosis: -prescribed Miconazole BID  12) Orthostatic hypotension: -recently diagnosed -continue midodrine -encouraged drinking water with a pinch of salt  13) Norovirus -discussed recent hospitalization.  -discussed that she is on Protonix for 2 weeks -discussed that she still has nausea and upset stomach on occasion -discussed that she was near sepsis  14) Hypoglycemia: -discussed that blood sugar decreased to 50s  15) Impaired mobility and ADLs: -continue home health -discussed increasing walking daily and doing resistance  exercise  16) Hypotension -discussed that she does feel dizzy -discussed that she is being checked for adrenal insufficiency  -discussed ted hose and that she has tried many kinds and getting them on is tough  17) Bilateral edema: -discussed that this has improved  18) Gastroparesis: -discussed that she tolerated the salad, black bean soup, lentil soup -encouraged miso-ginger  19) Left shoulder pain:  -discussed heating pad -discussed that she had temporary relief from steroid injection Discussed extracorporeal shockwave therapy as a modality for treatment. Discussed that the device looks and feels like a massage gun and I would move it over the area of pain for about 10  minutes. The device releases sound waves to the area of pain and helps to improve blood flow and circulation to improve the healing process. Discuss that this initially induces inflammation and can sometimes cause short-term increase in pain. Discussed that we typically do three weekly treatments, but sometimes up to 6 if needed, and after 6 weeks long term benefits can sometimes be achieved. Discussed that this is an FDA approved device, but not covered by insurance and would cost $60 per session. Will scheduled patient for 6 consecutive appointments and can cancel latter three if benefits are achieved after first three sessions.    20. Hammer toe: -discussed that she saw podiatry  21. Insomnia: -discussed amitriptyline, prescribed 10mg  HS -Try to go outside near sunrise -Get exercise during the day.  -Turn off all devices an hour before bedtime.  -Teas that can benefit: chamomile, valerian root, Brahmi (Bacopa) -Can consider over the counter melatonin, magnesium, and/or L-theanine. Melatonin is an anti-oxidant with multiple health benefits. Magnesium is involved in greater than 300 enzymatic reactions in the body and most of [place] are deficient as our soil is often depleted. There are 7 different types of magnesium-  Bioptemizer's is a supplement with all 7 types, and each has unique benefits. Magnesium can also help with constipation and anxiety.  -Pistachios naturally increase the production of melatonin -Cozy Earth bamboo bed sheets are free from toxic chemicals.  -Tart cherry juice or a tart cherry supplement can improve sleep and soreness post-workout  22. T12 compression fracture: -discussed plan for surgery -tizanidine q6H prn prescribed since this has been helpful for her, continue -d/c soma since not helpful and is activating for her when she wants to be able to sleep at night -discussed that neurosurgery has recommended neurosurgical intervention and that she plans to pursue this -discussed that dilaudid was unhelpful and constipated her -discussed her disappointment that nerve block is ineffective

## 2023-06-26 NOTE — Patient Instructions (Addendum)
 Constipation:  -Provided list of following foods that help with constipation and highlighted a few: 1) prunes- contain high amounts of fiber.  2) apples- has a form of dietary fiber called pectin that accelerates stool movement and increases beneficial gut bacteria 3) pears- in addition to fiber, also high in fructose and sorbitol which have laxative effect 4) figs- contain an enzyme ficin which helps to speed colonic transit 5) kiwis- contain an enzyme actinidin that improves gut motility and reduces constipation 6) oranges- rich in pectin (like apples) 7) grapefruits- contain a flavanol naringenin which has a laxative effect 8) vegetables- rich in fiber and also great sources of folate, vitamin C, and K 9) artichoke- high in inulin, prebiotic great for the microbiome 10) chicory- increases stool frequency and softness (can be added to coffee) 11) rhubarb- laxative effect 12) sweet potato- high fiber 13) beans, peas, and lentils- contain both soluble and insoluble fiber 14) chia seeds- improves intestinal health and gut flora 15) flaxseeds- laxative effect 16) whole grain rye bread- high in fiber 17) oat bran- high in soluble and insoluble fiber 18) kefir- softens stools -recommended to try at least one of these foods every day.  -drink 6-8 glasses of water per day -walk regularly, especially after meals.   2 [place] nuts

## 2023-06-26 NOTE — Telephone Encounter (Signed)
 Per Dr. Gaylan Gerold : can you please do pain contact and urine sample with [place]?  Order placed and given to patient. She is aware it needs to be completed in 24 hours.

## 2023-06-28 ENCOUNTER — Other Ambulatory Visit: Payer: Self-pay | Admitting: Physical Medicine and Rehabilitation

## 2023-06-28 ENCOUNTER — Ambulatory Visit: Payer: Medicare HMO | Admitting: Internal Medicine

## 2023-06-28 ENCOUNTER — Encounter: Payer: Self-pay | Admitting: Physical Medicine and Rehabilitation

## 2023-06-28 DIAGNOSIS — L989 Disorder of the skin and subcutaneous tissue, unspecified: Secondary | ICD-10-CM

## 2023-06-28 MED ORDER — PREGABALIN 25 MG PO CAPS: 25.0000 mg | ORAL_CAPSULE | Freq: Every day | ORAL | 3 refills | Status: DC

## 2023-06-28 MED ORDER — AMITRIPTYLINE HCL 10 MG PO TABS: 10.0000 mg | ORAL_TABLET | Freq: Every day | ORAL | 1 refills | Status: DC

## 2023-06-29 DIAGNOSIS — M533 Sacrococcygeal disorders, not elsewhere classified: Secondary | ICD-10-CM | POA: Diagnosis not present

## 2023-06-29 DIAGNOSIS — T84498A Other mechanical complication of other internal orthopedic devices, implants and grafts, initial encounter: Secondary | ICD-10-CM | POA: Diagnosis not present

## 2023-06-30 ENCOUNTER — Telehealth: Payer: Self-pay | Admitting: *Deleted

## 2023-06-30 ENCOUNTER — Telehealth: Payer: Self-pay | Admitting: Physical Medicine and Rehabilitation

## 2023-06-30 ENCOUNTER — Telehealth: Payer: Self-pay

## 2023-06-30 NOTE — Telephone Encounter (Signed)
 Outcome  Approved today by [facility] NCP[facility] 2017  Your request has been approved  Effective Date: 04/19/2023  Authorization Expiration Date: 04/17/2024

## 2023-06-30 NOTE — Telephone Encounter (Signed)
 PA For Pregabalin approved: Approval faxed to [facility] on [address].

## 2023-06-30 NOTE — Telephone Encounter (Addendum)
 Prior auth submitted to [employer] via CoverMyMeds for amitriptyline. Brandi Stephens (Key: [id number]).

## 2023-06-30 NOTE — Telephone Encounter (Signed)
 Patient is calling asking about Lyrica prior auth.

## 2023-07-03 ENCOUNTER — Telehealth: Payer: Self-pay | Admitting: Physical Medicine and Rehabilitation

## 2023-07-03 NOTE — Telephone Encounter (Signed)
 Following up on request from pharmacy requesting further information prior to dispensing medication. Lyrica and Amitriptyline

## 2023-07-04 NOTE — Telephone Encounter (Signed)
 Approved and pt has picked up her medication.

## 2023-07-04 NOTE — Telephone Encounter (Signed)
 I have spoke with the pharmacy and she has picked up bot her amitriptyline and pregabalin.

## 2023-07-06 ENCOUNTER — Encounter (HOSPITAL_BASED_OUTPATIENT_CLINIC_OR_DEPARTMENT_OTHER): Admitting: Physical Medicine and Rehabilitation

## 2023-07-06 DIAGNOSIS — G4701 Insomnia due to medical condition: Secondary | ICD-10-CM | POA: Diagnosis not present

## 2023-07-06 MED ORDER — AMITRIPTYLINE HCL 25 MG PO TABS
25.0000 mg | ORAL_TABLET | Freq: Every day | ORAL | 3 refills | Status: DC
Start: 2023-07-06 — End: 2023-07-13

## 2023-07-06 MED ORDER — TIZANIDINE HCL 4 MG PO TABS: 4.0000 mg | ORAL_TABLET | Freq: Four times a day (QID) | ORAL | 3 refills | Status: DC | PRN

## 2023-07-06 NOTE — Progress Notes (Signed)
 Subjective:    Patient ID: Brandi Stephens, female    DOB: 26-Dec-1945, 78 y.o.   MRN: [id number]  An audio/video tele-health visit is felt to be the most appropriate encounter for this patient at this time. This is a follow up tele-visit via phone. The patient is at home. MD is at office. Prior to scheduling this appointment, our staff discussed the limitations of evaluation and management by telemedicine and the availability of in-person appointments. The patient expressed understanding and agreed to proceed.    PRIOR HISTORY: Brandi Stephens presents for f/u after CIR admission for staph bacteremia after infection of her spinal hardware, and for neuropathy. Everything is the same since last visit. She is having more trouble with neuropathy in her feet and hands. The Cymbalta 60mg  does help somewhat. She used to get swelling with Gabapentin. The pain is bad during the day and night. Pain is improved to 4. She cannot feel her two little toes. Discussed Lyrica as an option. She would like try the Sprint PNS. Her back pain is the most severe. She has hardware. She follows with Dr. Bonnita Nasuti at the end of the month. The pain in her back feels like a dagger. The pain is more directly on the spine.   Has recently been falling again Neuropathy has her feet so severe that she cannot feel her lower extremities, she notes the neuropathy is worsening, but she had excellent relief from Qutenza for 2 weeks after first application. After the most recent application she has had severe burning pain around her ankles and along the 5th digit. It has kept her up at night and she was not able to enjoy her 76th birthday party. The Valium has not been enough to control this pain. She asks for something stronger. The Cymbalta helps her upper extremity neuropathy but not her lower extremity neuropathy. Her Valium needs to be refilled. She asks if the dose can be increased. She does not take every day. The Valium helps with her  pain as well.  She asks if we can go higher on the Topamax.   The Amitriptyline helps her to sleep a little better. She has improved sleep from 1 to 4 hours. She is having severe neuropathy in her left foot. The Cymbalta is helping with the neuropathic pain in her hands. She has been tolerating this medication well.   Last visit I transitioned her to outaptient therapy and she has an appointment today. She has noted improvements in her strength. She is ambulating steadily with RW today. She has bene trying using a cane with therapy. Outpatient therapy is doing well. She has been taking 1/2 a tablet of hydrocodone to help her tolerate better (prescribed by her surgeon).   Pain is currently 5/10 and worst in her lower back where her spinal harware is present as well as in paraspinals. No longer acquiring She has been taking approximately 1 percocet per day and would like to gradually stop using these. She has also been using four 650mg  of Tylenol per day and two 200mg  ibuprofen daily.   She no longer has severe diarrhea or constipation and is much happier about this. It does come and go. She takes 1000mg  magensium at night. She takes colace BID. She likes to eat healthy foods.   Her daughter accompanies her this visit and helps to provide history. She and her daughter have been out of work for 5 years due to her health conditions and need for her daughter's  assistance. Her grandchild recently graduated kindergarten.   1) B/l lower extremity edema She continues to have mild bilateral lower extremity edema.  Discussed use of compression garments, elevation, and ice.  Discussed the benefits of lymphedema therapy and she is willing to try this.  -She has been having pooling of fluids throughout her body -She has never seen nephrology but has followed with cardiology -Lasix administration is limited due to her creatinine.  -improved after initial Qutenza administration  2) Bilateral knees: -she has  fallen on her knees so many times.  -she has been using blue emu oil. -she has been using voltaren gel.  -she has a history of arthroscopic surgery and steroid injections -she uses turmeric  3) Muscle spasms -the Valium does help  4) Peripheral neuropathy: -still severe -amitriptyline 10mg  did not help -is taking topamax 100mg  and this is not helping -tizanidine does not help -tylenol does not help -she is nervous about the weight gain with lyrica -allergic to gabapentin -neuropathy extends to her knees now -the tizanidine has not helped much -she is fighting with Medicare for midline nerve block -she has not been able to take topiramate since she has been unable to return to the beach -she would like to start topiramate at 100mg  given the severity of her pain and her inability to sleep  -had 2 falls in 6 hours -60mg  Cymbalta is helping. -improved after initial administration of Qutenza but burning from the treatment was severe the second time so she prefers not to repeat -she did have some burning on ankle and shin after Qutenza and this has improved after she used the cooling gel -she loves the pain journal and keeps it by her -it is really affecting her quality of life  5) Prediabetes -has been eating very healthy foods  6) Anxiety: -they Valium helps- she uses as needed.  -she appreciates her husband's and daughter's care.   5) Dizziness -feels related to going out, anxiety.  6) obesity -has been trying to lose weight  7) Norovirus: -she was nauseous that she was falling -she was so dizzy and dehydrated  8) Orthostatic hypotension -moved to her beach house where she is on single floor  9) Gastroparesis: -saw GI on Tuesday -she was hospitalized  -she has lost 40 lbs -she took ozempic  10) s/p severe norovirus: -she was near sepsis  11) Hammer toe -she developed recently  12) Rotator cuff pain: -got an injection from ortho and it helped  temporarily  13) Thoracic compression fracture: -pain is very severe and she is considering surgery -she has been following with neurosurgeon Dr. Nicole Kindred and he has recommended surgery -she had relief from tizanidine -she had minimal relief from soma -she did not get relief from dilaudid  Pain Inventory Average Pain 10 Pain Right Now 9 My pain is constant, sharp, burning, dull, stabbing, tingling, and aching  In the last 24 hours, has pain interfered with the following? General activity 10 Relation with others 0 Enjoyment of life 10 What TIME of day is your pain at its worst? night Sleep (in general) Poor  Pain is worse with: walking, bending, sitting, standing, and some activites Pain improves with:  heat Relief from Meds: 5   Evening and night     Family History  Problem Relation Age of Onset   Atrial fibrillation Mother    Ulcers Father    Breast cancer Maternal Aunt    Lung cancer Maternal Uncle    Stomach cancer Maternal Grandmother  Lung cancer Maternal Aunt    Brain cancer Maternal Aunt    Prostate cancer Maternal Grandfather    Stroke Paternal Grandmother    Tuberculosis Paternal Grandfather    Colon cancer Neg Hx    Esophageal cancer Neg Hx    Rectal cancer Neg Hx    Social History   Socioeconomic History   Marital status: Married    Spouse name: robert   Number of children: 3   Years of education: college   Highest education level: Not on file  Occupational History   Occupation: retired  Tobacco Use   Smoking status: Former    Current packs/day: 0.00    Average packs/day: 0.5 packs/day for 35.0 years (17.5 ttl pk-yrs)    Types: Cigarettes    Start date: 05/23/1983    Quit date: 05/22/2018    Years since quitting: 5.1   Smokeless tobacco: Never  Vaping Use   Vaping status: Never Used  Substance and Sexual Activity   Alcohol use: Yes    Comment: 1 glass of wine a night   Drug use: Not Currently    Comment: CBD tincture   Sexual  activity: Yes    Birth control/protection: Post-menopausal  Other Topics Concern   Not on file  Social History Narrative   Lives with husband in a 2 story home.  Has 2 living children.  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  Education: college.    Right-handed.   Five cups coffee per week.   Social Drivers of Corporate investment banker Strain: Low Risk  (05/02/2023)   Overall Financial Resource Strain (CARDIA)    Difficulty of Paying Living Expenses: Not hard at all  Food Insecurity: No Food Insecurity (05/02/2023)   Hunger Vital Sign    Worried About Running Out of Food in the Last Year: Never true    Ran Out of Food in the Last Year: Never true  Transportation Needs: No Transportation Needs (05/02/2023)   PRAPARE - Administrator, Civil Service (Medical): No    Lack of Transportation (Non-Medical): No  Physical Activity: Inactive (05/02/2023)   Exercise Vital Sign    Days of Exercise per Week: 0 days    Minutes of Exercise per Session: 0 min  Stress: Stress Concern Present (05/02/2023)   [employer] of Occupational Health - Occupational Stress Questionnaire    Feeling of Stress : To some extent  Social Connections: Moderately Integrated (05/02/2023)   Social Connection and Isolation Panel [NHANES]    Frequency of Communication with Friends and Family: More than three times a week    Frequency of Social Gatherings with Friends and Family: More than three times a week    Attends Religious Services: 1 to 4 times per year    Active Member of [employer] or Organizations: No    Attends Engineer, structural: Never    Marital Status: Married   Past Surgical History:  Procedure Laterality Date   ABDOMINAL HYSTERECTOMY  1985   BASAL CELL CARCINOMA EXCISION  10/15   nose   BIOPSY  07/22/2022   Procedure: BIOPSY;  Surgeon: Meryl Dare, MD;  Location: [facility];  Service: Gastroenterology;;   BREAST BIOPSY Right 08/24/2015   BREAST BIOPSY Right  07/13/2015   BREAST BIOPSY Left 07/13/2015   BREAST EXCISIONAL BIOPSY Left 1972   BREAST LUMPECTOMY Left    BREAST LUMPECTOMY WITH RADIOACTIVE SEED AND SENTINEL LYMPH NODE BIOPSY Left 09/16/2015   Procedure: BREAST LUMPECTOMY  WITH RADIOACTIVE SEED AND SENTINEL LYMPH NODE BIOPSY;  Surgeon: Almond Lint, MD;  Location: [facility];  Service: General;  Laterality: Left;   BREAST REDUCTION SURGERY Bilateral 1998   CATARACT EXTRACTION W/ INTRAOCULAR LENS IMPLANT Left 2016   CERVICAL SPINE SURGERY  1995   hallo   COLONOSCOPY  01/2013   polyps   ESOPHAGOGASTRODUODENOSCOPY N/A 07/22/2022   Procedure: ESOPHAGOGASTRODUODENOSCOPY (EGD);  Surgeon: Meryl Dare, MD;  Location: [facility];  Service: Gastroenterology;  Laterality: N/A;   EYE SURGERY Left 2016   HAND SURGERY Left 02-19-2002   dr Merlyn Lot  @[facility]    repair collateral ligament/ MPJ left thumb   HARDWARE REMOVAL N/A 07/24/2019   Procedure: REVISION OF HARDWARE Lumbar two - Sacral one. Extention of Fusion to Lumbar one;  Surgeon: [name], MD;  Location: [facility];  Service: Neurosurgery;  Laterality: N/A;   HEMORRHOID SURGERY  2008   HIGH RESOLUTION ANOSCOPY N/A 02/28/2018   Procedure: HIGH RESOLUTION ANOSCOPY WITH BIOPSY;  Surgeon: Romie Levee, MD;  Location: [facility];  Service: General;  Laterality: N/A;   KNEE ARTHROSCOPY Right 2002   LUMBAR SPINE SURGERY  03-20-2015   dr Conchita Paris   fusion L4-5, L5-S1   MOUTH SURGERY  07/14/2018   2 infected teeth removed   MULTIPLE TOOTH EXTRACTIONS     with bone grafting lower bottom right and left   REDUCTION MAMMAPLASTY Bilateral    RIGHT COLECTOMY  09-10-2003   dr Jamey Ripa @[facility]    multiple colon polyps (per path tubular adenoma's, hyperplastic , benign appendix, benign two lymph nodes   ROTATOR CUFF REPAIR Left 04/2016   TONSILLECTOMY  1969   TUBAL LIGATION Bilateral 1982   Past Medical History:  Diagnosis Date   AIN (anal intraepithelial neoplasia) anal  canal    Anemia    with pregnancy   Anxiety    Arthritis    knees, lower back, knees   Chronic constipation    Chronic diastolic CHF (congestive heart failure) (HCC) 07/05/2019   CKD (chronic kidney disease)    Coarse tremors    Essential   COPD (chronic obstructive pulmonary disease) (HCC)    2017 chest CT, history of   Degenerative lumbar spinal stenosis    Depression    Depression 07/05/2019   Drug dependency (HCC)    Elevated PTHrP level 05/09/2019   EtOH use 07/05/2019   Pt denies heavy drinking however   Gastroparesis    GERD (gastroesophageal reflux disease)    pt denies   Gout    Grade I diastolic dysfunction 02/01/2016   Noted ECHO   Hemorrhoids    History of adenomatous polyp of colon    History of basal cell carcinoma (BCC) excision 2015   nose   History of sepsis 05/14/2014   post lumbar surgery   History of shingles    x2   Hyperlipidemia    Hypertension    Hypothyroidism    Irregular heart beat    history of while undergoing radiation   Malignant neoplasm of upper-inner quadrant of left breast in female, estrogen receptor positive [facility]) oncologist-  dr Darnelle Catalan--  per lov in epic no recurrence   dx 07-13-2015  Left breast invasive ductal carcinoma, Stage IA, Grade 1 (TXN0),  09-16-2015  s/p  left breast lumpectomy with sln bx's,  completed radiation 11-18-2015,  started antiestrogen therapy 12-14-2015   Neuropathy    Left foot and back of left leg   Obesity (BMI 30-39.9) 07/05/2019  Personal history of radiation therapy    completed 11-18-2015  left breast   Pneumonia    PONV (postoperative nausea and vomiting)    Prediabetes    diet controlled, no med   Restless leg syndrome    Seasonal allergies    Seizure (HCC) 05/2015   due to sepsis, only one time episode   Tremor    Wears partial dentures    lower    There were no vitals taken for this visit.  Opioid Risk Score:   Fall Risk Score:  `1  Depression screen PHQ 2/9     06/26/2023     1:10 PM 05/02/2023    3:41 PM 04/10/2023   11:16 AM 04/07/2023   10:24 AM 11/03/2022   10:36 AM 07/14/2022   11:02 AM 05/17/2022    2:28 PM  Depression screen PHQ 2/9  Decreased Interest 0 0 0 0 0 0 0  Down, Depressed, Hopeless 0 0 1 0 0 0 0  PHQ - 2 Score 0 0 1 0 0 0 0    Review of Systems  Constitutional: Negative.   HENT: Negative.    Eyes: Negative.   Respiratory: Negative.    Cardiovascular: Negative.   Gastrointestinal: Negative.   Endocrine: Negative.   Genitourinary: Negative.   Musculoskeletal:  Positive for gait problem.  Skin: Negative.   Allergic/Immunologic: Negative.   Neurological:  Positive for dizziness, tremors, weakness and numbness.       Tingling   Hematological: Negative.   Psychiatric/Behavioral: Negative.    All other systems reviewed and are negative.      Objective:   Physical Exam PRIOR EXAM: Gen: no distress, normal appearing HEENT: oral mucosa pink and moist, NCAT Cardio: Reg rate Chest: normal effort, normal rate of breathing Abd: soft, non-distended Ext: no edema Psych: pleasant, normal affect Skin: intact Neuro: Alert and oriented x3  Assessment & Plan:  Brandi Stephens is a 78 year old female who presents for f/u of neuropathy and below conditions.   1) Diarrhea: This was a huge issue previously and has since improved. I had obtained an XR that showed constipation and recommended an enema. Continue magnesium 1000mg  at night and colace BID. This continues but is improved.  -recommended shredded coconut  2) Impaired mobility and ADLs following spinal surgery: -Continue HEP Progressing well. Increase walking to 10 minutes per day -Recommended exercise bike at home to build back endurance. Continue walking outside as well as strength and gait are improving and she is no longer limited by diarrhea. -She has been having a lot of falls at home  3) Post-operative lower back pain: -Stopped oxycodone- was prescribed 1/2 pill hydrocodone by her  her spine surgery.  -Continue to supplement with icing, Tyelnol, and Ibuprofen.  -recommended topamax recommended to clear with cardiology first  4) Peripheral neuropathy: -topiramate 100mg  prescribed HS -Discussed Sprint PNS system as an option of pain treatment via neuromodulation. Provided following link for patient to learn more about the system: https://www.sprtherapeutics.com/. She would like to try this. Will target sciatic nerve at the popliteal fossa.  -refilled amitriptyline -will defer Qutenza -next time will only use 4 patches given increase in pain with 6 patches -will prescribe 5-7 day course of prn Norco to help with her current severe pain from the Qutenza application -will increase her Amitriptyline to 100mg .  -discussed that symptoms should abate soon, and to please keep me updated.    -discussed that pain has been severe -discussed that she prefers not  to add any medication  Great benefit- schedule for the rest of the year every 3 months Continue keeping pain journal Increase topamax to 75mg  HS  5) Anixety: refilled Valium to 10mg  daily prn. Provided refill. Has been taking twice per day and this has been helping her. Also helps with her muscle spasms. This has been well controlled. She is sometimes upset that she is only comfortably horizontally.  -discussed that she is taking the Valium about once per week.  -continue cymbalta and amitritpyline  6) Tachycardia: provided higher dose of metoprolol which she was on before last visit. HR is still slightly elevated today, but BP is low. Maintain current dose. She says BP was elevated at her doctor's appointment yesterday. It fluctuates.   7) Lower extremity edema: advised elevation, ice, and compression garments which she is already doing.  -continue Lasix -start lymphedema therapy -continue Qutenza q4months  8) Hot flashes: from Anastrazole.   Home support: Living with husband. Daughter lives 5 miles away.   9)  Bilateral knee pain: -continue voltaren gel and blue emu oil -recommended bacopa -continue turmeric   10) Obesity -commended on weight loss! -Educated regarding health benefits of weight loss- for pain, general health, chronic disease prevention, immune health, mental health.  -Will monitor weight every visit.  -Consider Roobois tea daily.  -Discussed the benefits of intermittent fasting. -Discussed foods that can assist in weight loss: 1) leafy greens- high in fiber and nutrients 2) dark chocolate- improves metabolism (if prefer sweetened, best to sweeten with honey instead of sugar).  3) cruciferous vegetables- high in fiber and protein 4) full fat yogurt: high in healthy fat, protein, calcium, and probiotics 5) apples- high in a variety of phytochemicals 6) nuts- high in fiber and protein that increase feelings of fullness 7) grapefruit: rich in nutrients, antioxidants, and fiber (not to be taken with anticoagulation) 8) beans- high in protein and fiber 9) salmon- has high quality protein and healthy fats 10) green tea- rich in polyphenols 11) eggs- rich in choline and vitamin D 12) tuna- high protein, boosts metabolism 13) avocado- decreases visceral abdominal fat 14) chicken (pasture raised): high in protein and iron 15) blueberries- reduce abdominal fat and cholesterol 16) whole grains- decreases calories retained during digestion, speeds metabolism 17) chia seeds- curb appetite 18) chilies- increases fat metabolism  -Discussed supplements that can be used:  1) Metatrim 400mg  BID 30 minutes before breakfast and dinner  2) Sphaeranthus indicus and Garcinia mangostana (combinations of these and #1 can be found in capsicum and zychrome  3) green coffee bean extract 400mg  twice per day or Irvingia (african mango) 150 to 300mg  twice per day.  11) Onchomycosis: -prescribed Miconazole BID  12) Orthostatic hypotension: -recently diagnosed -continue midodrine -encouraged  drinking water with a pinch of salt  13) Norovirus -discussed recent hospitalization.  -discussed that she is on Protonix for 2 weeks -discussed that she still has nausea and upset stomach on occasion -discussed that she was near sepsis  14) Hypoglycemia: -discussed that blood sugar decreased to 50s  15) Impaired mobility and ADLs: -continue home health -discussed increasing walking daily and doing resistance exercise  16) Hypotension -discussed that she does feel dizzy -discussed that she is being checked for adrenal insufficiency  -discussed ted hose and that she has tried many kinds and getting them on is tough  17) Bilateral edema: -discussed that this has improved  18) Gastroparesis: -discussed that she tolerated the salad, black bean soup, lentil soup -encouraged miso-ginger  19)  Left shoulder pain:  -discussed heating pad -discussed that she had temporary relief from steroid injection Discussed extracorporeal shockwave therapy as a modality for treatment. Discussed that the device looks and feels like a massage gun and I would move it over the area of pain for about 10 minutes. The device releases sound waves to the area of pain and helps to improve blood flow and circulation to improve the healing process. Discuss that this initially induces inflammation and can sometimes cause short-term increase in pain. Discussed that we typically do three weekly treatments, but sometimes up to 6 if needed, and after 6 weeks long term benefits can sometimes be achieved. Discussed that this is an FDA approved device, but not covered by insurance and would cost $60 per session. Will scheduled patient for 6 consecutive appointments and can cancel latter three if benefits are achieved after first three sessions.    20. Hammer toe: -discussed that she saw podiatry  21. 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  -Turn off all devices an hour before bedtime.  -Teas that can benefit: chamomile, valerian root, Brahmi (Bacopa) -Can consider over the counter melatonin, magnesium, and/or L-theanine. Melatonin is an anti-oxidant with multiple health benefits. Magnesium is involved in greater than 300 enzymatic reactions in the body and most of [place] are deficient as our soil is often depleted. There are 7 different types of magnesium- Bioptemizer's is a supplement with all 7 types, and each has unique benefits. Magnesium can also help with constipation and anxiety.  -Pistachios naturally increase the production of melatonin -Cozy Earth bamboo bed sheets are free from toxic chemicals.  -Tart cherry juice or a tart cherry supplement can improve sleep and soreness post-workout  22. T12 compression fracture: -discussed plan for surgery -tizanidine q6H prn prescribed since this has been helpful for her, continue -d/c soma since not helpful and is activating for her when she wants to be able to sleep at night -discussed that neurosurgery has recommended neurosurgical intervention and that she plans to pursue this -discussed that dilaudid was unhelpful and constipated her -discussed her disappointment that nerve block is ineffective  11 minutes spent in discussion of her continued insomnia, increasing the amitriptyline to 25mg , discussed that she has not experienced any negative side effects, discussed that 20mg  did not help, advised trying 25mg  or 30mg  tonight, discussed that she had the nerve block and it was very painful for her, discussed natural options for the bags under her eyes

## 2023-07-11 ENCOUNTER — Other Ambulatory Visit: Payer: Self-pay | Admitting: Medical

## 2023-07-11 ENCOUNTER — Encounter: Payer: Self-pay | Admitting: Physical Medicine and Rehabilitation

## 2023-07-11 MED ORDER — LEVOTHYROXINE SODIUM 75 MCG PO TABS: 75.0000 ug | ORAL_TABLET | Freq: Every day | ORAL | 2 refills | Status: DC

## 2023-07-12 NOTE — Progress Notes (Unsigned)
 Subjective:    Patient ID: Brandi Stephens, female    DOB: 26-Dec-1945, 78 y.o.   MRN: [id number]  An audio/video tele-health visit is felt to be the most appropriate encounter for this patient at this time. This is a follow up tele-visit via phone. The patient is at home. MD is at office. Prior to scheduling this appointment, our staff discussed the limitations of evaluation and management by telemedicine and the availability of in-person appointments. The patient expressed understanding and agreed to proceed.    PRIOR HISTORY: Brandi Stephens presents for f/u after CIR admission for staph bacteremia after infection of her spinal hardware, and for neuropathy. Everything is the same since last visit. She is having more trouble with neuropathy in her feet and hands. The Cymbalta 60mg  does help somewhat. She used to get swelling with Gabapentin. The pain is bad during the day and night. Pain is improved to 4. She cannot feel her two little toes. Discussed Lyrica as an option. She would like try the Sprint PNS. Her back pain is the most severe. She has hardware. She follows with Dr. Bonnita Nasuti at the end of the month. The pain in her back feels like a dagger. The pain is more directly on the spine.   Has recently been falling again Neuropathy has her feet so severe that she cannot feel her lower extremities, she notes the neuropathy is worsening, but she had excellent relief from Qutenza for 2 weeks after first application. After the most recent application she has had severe burning pain around her ankles and along the 5th digit. It has kept her up at night and she was not able to enjoy her 76th birthday party. The Valium has not been enough to control this pain. She asks for something stronger. The Cymbalta helps her upper extremity neuropathy but not her lower extremity neuropathy. Her Valium needs to be refilled. She asks if the dose can be increased. She does not take every day. The Valium helps with her  pain as well.  She asks if we can go higher on the Topamax.   The Amitriptyline helps her to sleep a little better. She has improved sleep from 1 to 4 hours. She is having severe neuropathy in her left foot. The Cymbalta is helping with the neuropathic pain in her hands. She has been tolerating this medication well.   Last visit I transitioned her to outaptient therapy and she has an appointment today. She has noted improvements in her strength. She is ambulating steadily with RW today. She has bene trying using a cane with therapy. Outpatient therapy is doing well. She has been taking 1/2 a tablet of hydrocodone to help her tolerate better (prescribed by her surgeon).   Pain is currently 5/10 and worst in her lower back where her spinal harware is present as well as in paraspinals. No longer acquiring She has been taking approximately 1 percocet per day and would like to gradually stop using these. She has also been using four 650mg  of Tylenol per day and two 200mg  ibuprofen daily.   She no longer has severe diarrhea or constipation and is much happier about this. It does come and go. She takes 1000mg  magensium at night. She takes colace BID. She likes to eat healthy foods.   Her daughter accompanies her this visit and helps to provide history. She and her daughter have been out of work for 5 years due to her health conditions and need for her daughter's  assistance. Her grandchild recently graduated kindergarten.   1) B/l lower extremity edema She continues to have mild bilateral lower extremity edema.  Discussed use of compression garments, elevation, and ice.  Discussed the benefits of lymphedema therapy and she is willing to try this.  -She has been having pooling of fluids throughout her body -She has never seen nephrology but has followed with cardiology -Lasix administration is limited due to her creatinine.  -improved after initial Qutenza administration  2) Bilateral knees: -she has  fallen on her knees so many times.  -she has been using blue emu oil. -she has been using voltaren gel.  -she has a history of arthroscopic surgery and steroid injections -she uses turmeric  3) Muscle spasms -the Valium does help  4) Peripheral neuropathy: -still severe -amitriptyline 10mg  did not help -is taking topamax 100mg  and this is not helping -tizanidine does not help -tylenol does not help -she is nervous about the weight gain with lyrica -allergic to gabapentin -neuropathy extends to her knees now -the tizanidine has not helped much -she is fighting with Medicare for midline nerve block -she has not been able to take topiramate since she has been unable to return to the beach -she would like to start topiramate at 100mg  given the severity of her pain and her inability to sleep  -had 2 falls in 6 hours -60mg  Cymbalta is helping. -improved after initial administration of Qutenza but burning from the treatment was severe the second time so she prefers not to repeat -she did have some burning on ankle and shin after Qutenza and this has improved after she used the cooling gel -she loves the pain journal and keeps it by her -it is really affecting her quality of life  5) Prediabetes -has been eating very healthy foods  6) Anxiety: -they Valium helps- she uses as needed.  -she appreciates her husband's and daughter's care.   5) Dizziness -feels related to going out, anxiety.  6) obesity -has been trying to lose weight  7) Norovirus: -she was nauseous that she was falling -she was so dizzy and dehydrated  8) Orthostatic hypotension -moved to her beach house where she is on single floor  9) Gastroparesis: -saw GI on Tuesday -she was hospitalized  -she has lost 40 lbs -she took ozempic  10) s/p severe norovirus: -she was near sepsis  11) Hammer toe -she developed recently  12) Rotator cuff pain: -got an injection from ortho and it helped  temporarily  13) Thoracic compression fracture: -pain is very severe and she is considering surgery -she has been following with neurosurgeon Dr. Nicole Kindred and he has recommended surgery -she had relief from tizanidine -she had minimal relief from soma -she did not get relief from dilaudid  Pain Inventory Average Pain 10 Pain Right Now 9 My pain is constant, sharp, burning, dull, stabbing, tingling, and aching  In the last 24 hours, has pain interfered with the following? General activity 10 Relation with others 0 Enjoyment of life 10 What TIME of day is your pain at its worst? night Sleep (in general) Poor  Pain is worse with: walking, bending, sitting, standing, and some activites Pain improves with:  heat Relief from Meds: 5   Evening and night     Family History  Problem Relation Age of Onset   Atrial fibrillation Mother    Ulcers Father    Breast cancer Maternal Aunt    Lung cancer Maternal Uncle    Stomach cancer Maternal Grandmother  Lung cancer Maternal Aunt    Brain cancer Maternal Aunt    Prostate cancer Maternal Grandfather    Stroke Paternal Grandmother    Tuberculosis Paternal Grandfather    Colon cancer Neg Hx    Esophageal cancer Neg Hx    Rectal cancer Neg Hx    Social History   Socioeconomic History   Marital status: Married    Spouse name: robert   Number of children: 3   Years of education: college   Highest education level: Not on file  Occupational History   Occupation: retired  Tobacco Use   Smoking status: Former    Current packs/day: 0.00    Average packs/day: 0.5 packs/day for 35.0 years (17.5 ttl pk-yrs)    Types: Cigarettes    Start date: 05/23/1983    Quit date: 05/22/2018    Years since quitting: 5.1   Smokeless tobacco: Never  Vaping Use   Vaping status: Never Used  Substance and Sexual Activity   Alcohol use: Yes    Comment: 1 glass of wine a night   Drug use: Not Currently    Comment: CBD tincture   Sexual  activity: Yes    Birth control/protection: Post-menopausal  Other Topics Concern   Not on file  Social History Narrative   Lives with husband in a 2 story home.  Has 2 living children.  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  Education: college.    Right-handed.   Five cups coffee per week.   Social Drivers of Corporate investment banker Strain: Low Risk  (05/02/2023)   Overall Financial Resource Strain (CARDIA)    Difficulty of Paying Living Expenses: Not hard at all  Food Insecurity: No Food Insecurity (05/02/2023)   Hunger Vital Sign    Worried About Running Out of Food in the Last Year: Never true    Ran Out of Food in the Last Year: Never true  Transportation Needs: No Transportation Needs (05/02/2023)   PRAPARE - Administrator, Civil Service (Medical): No    Lack of Transportation (Non-Medical): No  Physical Activity: Inactive (05/02/2023)   Exercise Vital Sign    Days of Exercise per Week: 0 days    Minutes of Exercise per Session: 0 min  Stress: Stress Concern Present (05/02/2023)   [employer] of Occupational Health - Occupational Stress Questionnaire    Feeling of Stress : To some extent  Social Connections: Moderately Integrated (05/02/2023)   Social Connection and Isolation Panel [NHANES]    Frequency of Communication with Friends and Family: More than three times a week    Frequency of Social Gatherings with Friends and Family: More than three times a week    Attends Religious Services: 1 to 4 times per year    Active Member of [employer] or Organizations: No    Attends Engineer, structural: Never    Marital Status: Married   Past Surgical History:  Procedure Laterality Date   ABDOMINAL HYSTERECTOMY  1985   BASAL CELL CARCINOMA EXCISION  10/15   nose   BIOPSY  07/22/2022   Procedure: BIOPSY;  Surgeon: Meryl Dare, MD;  Location: [facility];  Service: Gastroenterology;;   BREAST BIOPSY Right 08/24/2015   BREAST BIOPSY Right  07/13/2015   BREAST BIOPSY Left 07/13/2015   BREAST EXCISIONAL BIOPSY Left 1972   BREAST LUMPECTOMY Left    BREAST LUMPECTOMY WITH RADIOACTIVE SEED AND SENTINEL LYMPH NODE BIOPSY Left 09/16/2015   Procedure: BREAST LUMPECTOMY  WITH RADIOACTIVE SEED AND SENTINEL LYMPH NODE BIOPSY;  Surgeon: Almond Lint, MD;  Location: [facility];  Service: General;  Laterality: Left;   BREAST REDUCTION SURGERY Bilateral 1998   CATARACT EXTRACTION W/ INTRAOCULAR LENS IMPLANT Left 2016   CERVICAL SPINE SURGERY  1995   hallo   COLONOSCOPY  01/2013   polyps   ESOPHAGOGASTRODUODENOSCOPY N/A 07/22/2022   Procedure: ESOPHAGOGASTRODUODENOSCOPY (EGD);  Surgeon: Meryl Dare, MD;  Location: [facility];  Service: Gastroenterology;  Laterality: N/A;   EYE SURGERY Left 2016   HAND SURGERY Left 02-19-2002   dr Merlyn Lot  @[facility]    repair collateral ligament/ MPJ left thumb   HARDWARE REMOVAL N/A 07/24/2019   Procedure: REVISION OF HARDWARE Lumbar two - Sacral one. Extention of Fusion to Lumbar one;  Surgeon: [name], MD;  Location: [facility];  Service: Neurosurgery;  Laterality: N/A;   HEMORRHOID SURGERY  2008   HIGH RESOLUTION ANOSCOPY N/A 02/28/2018   Procedure: HIGH RESOLUTION ANOSCOPY WITH BIOPSY;  Surgeon: Romie Levee, MD;  Location: [facility];  Service: General;  Laterality: N/A;   KNEE ARTHROSCOPY Right 2002   LUMBAR SPINE SURGERY  03-20-2015   dr Conchita Paris   fusion L4-5, L5-S1   MOUTH SURGERY  07/14/2018   2 infected teeth removed   MULTIPLE TOOTH EXTRACTIONS     with bone grafting lower bottom right and left   REDUCTION MAMMAPLASTY Bilateral    RIGHT COLECTOMY  09-10-2003   dr Jamey Ripa @[facility]    multiple colon polyps (per path tubular adenoma's, hyperplastic , benign appendix, benign two lymph nodes   ROTATOR CUFF REPAIR Left 04/2016   TONSILLECTOMY  1969   TUBAL LIGATION Bilateral 1982   Past Medical History:  Diagnosis Date   AIN (anal intraepithelial neoplasia) anal  canal    Anemia    with pregnancy   Anxiety    Arthritis    knees, lower back, knees   Chronic constipation    Chronic diastolic CHF (congestive heart failure) (HCC) 07/05/2019   CKD (chronic kidney disease)    Coarse tremors    Essential   COPD (chronic obstructive pulmonary disease) (HCC)    2017 chest CT, history of   Degenerative lumbar spinal stenosis    Depression    Depression 07/05/2019   Drug dependency (HCC)    Elevated PTHrP level 05/09/2019   EtOH use 07/05/2019   Pt denies heavy drinking however   Gastroparesis    GERD (gastroesophageal reflux disease)    pt denies   Gout    Grade I diastolic dysfunction 02/01/2016   Noted ECHO   Hemorrhoids    History of adenomatous polyp of colon    History of basal cell carcinoma (BCC) excision 2015   nose   History of sepsis 05/14/2014   post lumbar surgery   History of shingles    x2   Hyperlipidemia    Hypertension    Hypothyroidism    Irregular heart beat    history of while undergoing radiation   Malignant neoplasm of upper-inner quadrant of left breast in female, estrogen receptor positive [facility]) oncologist-  dr Darnelle Catalan--  per lov in epic no recurrence   dx 07-13-2015  Left breast invasive ductal carcinoma, Stage IA, Grade 1 (TXN0),  09-16-2015  s/p  left breast lumpectomy with sln bx's,  completed radiation 11-18-2015,  started antiestrogen therapy 12-14-2015   Neuropathy    Left foot and back of left leg   Obesity (BMI 30-39.9) 07/05/2019  Personal history of radiation therapy    completed 11-18-2015  left breast   Pneumonia    PONV (postoperative nausea and vomiting)    Prediabetes    diet controlled, no med   Restless leg syndrome    Seasonal allergies    Seizure (HCC) 05/2015   due to sepsis, only one time episode   Tremor    Wears partial dentures    lower    There were no vitals taken for this visit.  Opioid Risk Score:   Fall Risk Score:  `1  Depression screen PHQ 2/9     06/26/2023     1:10 PM 05/02/2023    3:41 PM 04/10/2023   11:16 AM 04/07/2023   10:24 AM 11/03/2022   10:36 AM 07/14/2022   11:02 AM 05/17/2022    2:28 PM  Depression screen PHQ 2/9  Decreased Interest 0 0 0 0 0 0 0  Down, Depressed, Hopeless 0 0 1 0 0 0 0  PHQ - 2 Score 0 0 1 0 0 0 0    Review of Systems  Constitutional: Negative.   HENT: Negative.    Eyes: Negative.   Respiratory: Negative.    Cardiovascular: Negative.   Gastrointestinal: Negative.   Endocrine: Negative.   Genitourinary: Negative.   Musculoskeletal:  Positive for gait problem.  Skin: Negative.   Allergic/Immunologic: Negative.   Neurological:  Positive for dizziness, tremors, weakness and numbness.       Tingling   Hematological: Negative.   Psychiatric/Behavioral: Negative.    All other systems reviewed and are negative.      Objective:   Physical Exam PRIOR EXAM: Gen: no distress, normal appearing HEENT: oral mucosa pink and moist, NCAT Cardio: Reg rate Chest: normal effort, normal rate of breathing Abd: soft, non-distended Ext: no edema Psych: pleasant, normal affect Skin: intact Neuro: Alert and oriented x3  Assessment & Plan:  Brandi Stephens is a 78 year old female who presents for f/u of neuropathy and below conditions.   1) Diarrhea: This was a huge issue previously and has since improved. I had obtained an XR that showed constipation and recommended an enema. Continue magnesium 1000mg  at night and colace BID. This continues but is improved.  -recommended shredded coconut  2) Impaired mobility and ADLs following spinal surgery: -Continue HEP Progressing well. Increase walking to 10 minutes per day -Recommended exercise bike at home to build back endurance. Continue walking outside as well as strength and gait are improving and she is no longer limited by diarrhea. -She has been having a lot of falls at home  3) Post-operative lower back pain: -Stopped oxycodone- was prescribed 1/2 pill hydrocodone by her  her spine surgery.  -Continue to supplement with icing, Tyelnol, and Ibuprofen.  -recommended topamax recommended to clear with cardiology first  4) Peripheral neuropathy: -topiramate 100mg  prescribed HS -Discussed Sprint PNS system as an option of pain treatment via neuromodulation. Provided following link for patient to learn more about the system: https://www.sprtherapeutics.com/. She would like to try this. Will target sciatic nerve at the popliteal fossa.  -refilled amitriptyline -will defer Qutenza -next time will only use 4 patches given increase in pain with 6 patches -will prescribe 5-7 day course of prn Norco to help with her current severe pain from the Qutenza application -will increase her Amitriptyline to 100mg .  -discussed that symptoms should abate soon, and to please keep me updated.    -discussed that pain has been severe -discussed that she prefers not  to add any medication  Great benefit- schedule for the rest of the year every 3 months Continue keeping pain journal Increase topamax to 75mg  HS  5) Anixety: refilled Valium to 10mg  daily prn. Provided refill. Has been taking twice per day and this has been helping her. Also helps with her muscle spasms. This has been well controlled. She is sometimes upset that she is only comfortably horizontally.  -discussed that she is taking the Valium about once per week.  -continue cymbalta and amitritpyline  6) Tachycardia: provided higher dose of metoprolol which she was on before last visit. HR is still slightly elevated today, but BP is low. Maintain current dose. She says BP was elevated at her doctor's appointment yesterday. It fluctuates.   7) Lower extremity edema: advised elevation, ice, and compression garments which she is already doing.  -continue Lasix -start lymphedema therapy -continue Qutenza q4months  8) Hot flashes: from Anastrazole.   Home support: Living with husband. Daughter lives 5 miles away.   9)  Bilateral knee pain: -continue voltaren gel and blue emu oil -recommended bacopa -continue turmeric   10) Obesity -commended on weight loss! -Educated regarding health benefits of weight loss- for pain, general health, chronic disease prevention, immune health, mental health.  -Will monitor weight every visit.  -Consider Roobois tea daily.  -Discussed the benefits of intermittent fasting. -Discussed foods that can assist in weight loss: 1) leafy greens- high in fiber and nutrients 2) dark chocolate- improves metabolism (if prefer sweetened, best to sweeten with honey instead of sugar).  3) cruciferous vegetables- high in fiber and protein 4) full fat yogurt: high in healthy fat, protein, calcium, and probiotics 5) apples- high in a variety of phytochemicals 6) nuts- high in fiber and protein that increase feelings of fullness 7) grapefruit: rich in nutrients, antioxidants, and fiber (not to be taken with anticoagulation) 8) beans- high in protein and fiber 9) salmon- has high quality protein and healthy fats 10) green tea- rich in polyphenols 11) eggs- rich in choline and vitamin D 12) tuna- high protein, boosts metabolism 13) avocado- decreases visceral abdominal fat 14) chicken (pasture raised): high in protein and iron 15) blueberries- reduce abdominal fat and cholesterol 16) whole grains- decreases calories retained during digestion, speeds metabolism 17) chia seeds- curb appetite 18) chilies- increases fat metabolism  -Discussed supplements that can be used:  1) Metatrim 400mg  BID 30 minutes before breakfast and dinner  2) Sphaeranthus indicus and Garcinia mangostana (combinations of these and #1 can be found in capsicum and zychrome  3) green coffee bean extract 400mg  twice per day or Irvingia (african mango) 150 to 300mg  twice per day.  11) Onchomycosis: -prescribed Miconazole BID  12) Orthostatic hypotension: -recently diagnosed -continue midodrine -encouraged  drinking water with a pinch of salt  13) Norovirus -discussed recent hospitalization.  -discussed that she is on Protonix for 2 weeks -discussed that she still has nausea and upset stomach on occasion -discussed that she was near sepsis  14) Hypoglycemia: -discussed that blood sugar decreased to 50s  15) Impaired mobility and ADLs: -continue home health -discussed increasing walking daily and doing resistance exercise  16) Hypotension -discussed that she does feel dizzy -discussed that she is being checked for adrenal insufficiency  -discussed ted hose and that she has tried many kinds and getting them on is tough  17) Bilateral edema: -discussed that this has improved  18) Gastroparesis: -discussed that she tolerated the salad, black bean soup, lentil soup -encouraged miso-ginger  19)  Left shoulder pain:  -discussed heating pad -discussed that she had temporary relief from steroid injection Discussed extracorporeal shockwave therapy as a modality for treatment. Discussed that the device looks and feels like a massage gun and I would move it over the area of pain for about 10 minutes. The device releases sound waves to the area of pain and helps to improve blood flow and circulation to improve the healing process. Discuss that this initially induces inflammation and can sometimes cause short-term increase in pain. Discussed that we typically do three weekly treatments, but sometimes up to 6 if needed, and after 6 weeks long term benefits can sometimes be achieved. Discussed that this is an FDA approved device, but not covered by insurance and would cost $60 per session. Will scheduled patient for 6 consecutive appointments and can cancel latter three if benefits are achieved after first three sessions.    20. Hammer toe: -discussed that she saw podiatry  21. 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  -Turn off all devices an hour before bedtime.  -Teas that can benefit: chamomile, valerian root, Brahmi (Bacopa) -Can consider over the counter melatonin, magnesium, and/or L-theanine. Melatonin is an anti-oxidant with multiple health benefits. Magnesium is involved in greater than 300 enzymatic reactions in the body and most of [place] are deficient as our soil is often depleted. There are 7 different types of magnesium- Bioptemizer's is a supplement with all 7 types, and each has unique benefits. Magnesium can also help with constipation and anxiety.  -Pistachios naturally increase the production of melatonin -Cozy Earth bamboo bed sheets are free from toxic chemicals.  -Tart cherry juice or a tart cherry supplement can improve sleep and soreness post-workout  22. T12 compression fracture: -discussed plan for surgery -tizanidine q6H prn prescribed since this has been helpful for her, continue -d/c soma since not helpful and is activating for her when she wants to be able to sleep at night -discussed that neurosurgery has recommended neurosurgical intervention and that she plans to pursue this -discussed that dilaudid was unhelpful and constipated her -discussed her disappointment that nerve block is ineffective  11 minutes spent in discussion of her continued insomnia, increasing the amitriptyline to 25mg , discussed that she has not experienced any negative side effects, discussed that 20mg  did not help, advised trying 25mg  or 30mg  tonight, discussed that she had the nerve block and it was very painful for her, discussed natural options for the bags under her eyes

## 2023-07-13 ENCOUNTER — Encounter (HOSPITAL_BASED_OUTPATIENT_CLINIC_OR_DEPARTMENT_OTHER): Admitting: Physical Medicine and Rehabilitation

## 2023-07-13 DIAGNOSIS — G4701 Insomnia due to medical condition: Secondary | ICD-10-CM

## 2023-07-13 DIAGNOSIS — G629 Polyneuropathy, unspecified: Secondary | ICD-10-CM

## 2023-07-13 MED ORDER — DIAZEPAM 10 MG PO TABS: 10.0000 mg | ORAL_TABLET | Freq: Every day | ORAL | 3 refills | Status: DC | PRN

## 2023-07-13 MED ORDER — AMITRIPTYLINE HCL 25 MG PO TABS: 25.0000 mg | ORAL_TABLET | Freq: Every day | ORAL | 3 refills | Status: DC

## 2023-07-13 MED ORDER — PREGABALIN 25 MG PO CAPS
25.0000 mg | ORAL_CAPSULE | Freq: Three times a day (TID) | ORAL | 3 refills | Status: DC
Start: 2023-07-13 — End: 2023-08-23

## 2023-07-13 NOTE — Telephone Encounter (Signed)
 Tried to call the office but office is currently closed

## 2023-07-13 NOTE — Telephone Encounter (Signed)
 I have tried to call the office mutiple times since 1pm and I can not reach anyone on the phone.  Sending to Triage and Clinical pool for Infectious Disease-   Can someone please get this message to Dr. Drue Second as Kristian Covey, PA-C sent a message to Dr. Drue Second on 06/29/23 but has not received a reply yet, he would like to get Dr. Drue Second input.

## 2023-07-14 ENCOUNTER — Telehealth: Payer: Self-pay | Admitting: Medical

## 2023-07-14 NOTE — Telephone Encounter (Signed)
 Copied from CRM [id number]. Topic: Referral - Question >> Jul 14, 2023  1:35 PM Shelah Lewandowsky wrote: Reason for CRM: Patient is back at the beach and will not need to see Dr Ilsa Iha now, will try to find a dermatologist there, won't be back in [place] until June. [phone]

## 2023-07-16 ENCOUNTER — Other Ambulatory Visit: Payer: Self-pay | Admitting: Medical

## 2023-07-16 MED ORDER — SYNTHROID 75 MCG PO TABS: 75.0000 ug | ORAL_TABLET | Freq: Every day | ORAL | 0 refills | Status: DC

## 2023-07-17 ENCOUNTER — Telehealth: Payer: Self-pay

## 2023-07-17 NOTE — Telephone Encounter (Signed)
 Approved today by [facility] NCPDP [id number] Your request has been approved Effective Date: 04/19/2023 Authorization Expiration Date: 10/15/2023

## 2023-07-17 NOTE — Telephone Encounter (Signed)
(  KeyCato Mulligan) PA Case ID #: [id number] Rx #: [id number]

## 2023-08-17 DIAGNOSIS — G8929 Other chronic pain: Secondary | ICD-10-CM | POA: Diagnosis not present

## 2023-08-17 DIAGNOSIS — Z79899 Other long term (current) drug therapy: Secondary | ICD-10-CM | POA: Diagnosis not present

## 2023-08-17 DIAGNOSIS — M545 Low back pain, unspecified: Secondary | ICD-10-CM | POA: Diagnosis not present

## 2023-08-17 DIAGNOSIS — M5416 Radiculopathy, lumbar region: Secondary | ICD-10-CM | POA: Diagnosis not present

## 2023-08-17 DIAGNOSIS — M961 Postlaminectomy syndrome, not elsewhere classified: Secondary | ICD-10-CM | POA: Diagnosis not present

## 2023-08-22 ENCOUNTER — Encounter: Payer: Self-pay | Admitting: Physical Medicine and Rehabilitation

## 2023-08-23 ENCOUNTER — Other Ambulatory Visit: Payer: Self-pay | Admitting: Physical Medicine and Rehabilitation

## 2023-08-23 MED ORDER — TRAZODONE HCL 50 MG PO TABS: 50.0000 mg | ORAL_TABLET | Freq: Every day | ORAL | 3 refills | Status: DC

## 2023-08-23 MED ORDER — PREGABALIN 50 MG PO CAPS: 50.0000 mg | ORAL_CAPSULE | Freq: Three times a day (TID) | ORAL | 3 refills | Status: DC

## 2023-08-31 NOTE — Telephone Encounter (Signed)
 PAP Ozempic received, pt informed

## 2023-09-04 ENCOUNTER — Telehealth: Payer: Self-pay | Admitting: Internal Medicine

## 2023-09-04 DIAGNOSIS — G894 Chronic pain syndrome: Secondary | ICD-10-CM

## 2023-09-04 DIAGNOSIS — G8929 Other chronic pain: Secondary | ICD-10-CM

## 2023-09-04 NOTE — Telephone Encounter (Signed)
 [facility] for OT for back pain as she has a broken back. Trying to do pain shots with pain clinic and wanting to OT to help before doing Back surgery again. Looking at surgery possibility in the fall    Copied from CRM [id number]. Topic: Clinical - Home Health Verbal Orders >> Sep 04, 2023  3:45 PM Leory Rands wrote: Patient is calling to speak to Irena Manners to request [facility] in [place], [place]. With [facility]. [phone]

## 2023-09-05 NOTE — Telephone Encounter (Signed)
 Per pt she wanted OT as she felt like it helped better but went ahead and put in PT as well incase she needs that too.

## 2023-09-06 ENCOUNTER — Telehealth: Payer: Self-pay | Admitting: Physical Medicine and Rehabilitation

## 2023-09-06 NOTE — Telephone Encounter (Signed)
 P rt a call to you

## 2023-09-07 ENCOUNTER — Encounter: Payer: Self-pay | Admitting: Medical

## 2023-09-07 ENCOUNTER — Telehealth (INDEPENDENT_AMBULATORY_CARE_PROVIDER_SITE_OTHER): Admitting: Medical

## 2023-09-07 VITALS — BP 105/52 | HR 56 | Ht 67.0 in | Wt 152.0 lb

## 2023-09-07 DIAGNOSIS — S22000G Wedge compression fracture of unspecified thoracic vertebra, subsequent encounter for fracture with delayed healing: Secondary | ICD-10-CM | POA: Diagnosis not present

## 2023-09-07 DIAGNOSIS — M549 Dorsalgia, unspecified: Secondary | ICD-10-CM

## 2023-09-07 DIAGNOSIS — Z79899 Other long term (current) drug therapy: Secondary | ICD-10-CM

## 2023-09-07 DIAGNOSIS — I10 Essential (primary) hypertension: Secondary | ICD-10-CM | POA: Diagnosis not present

## 2023-09-07 DIAGNOSIS — M47816 Spondylosis without myelopathy or radiculopathy, lumbar region: Secondary | ICD-10-CM

## 2023-09-07 DIAGNOSIS — G8929 Other chronic pain: Secondary | ICD-10-CM

## 2023-09-07 DIAGNOSIS — M62838 Other muscle spasm: Secondary | ICD-10-CM

## 2023-09-07 DIAGNOSIS — G894 Chronic pain syndrome: Secondary | ICD-10-CM

## 2023-09-07 DIAGNOSIS — M48061 Spinal stenosis, lumbar region without neurogenic claudication: Secondary | ICD-10-CM | POA: Diagnosis not present

## 2023-09-07 DIAGNOSIS — R296 Repeated falls: Secondary | ICD-10-CM | POA: Diagnosis not present

## 2023-09-07 NOTE — Progress Notes (Signed)
 Subjective:     Patient ID: Brandi Stephens, female   DOB: 1946/03/25, 78 y.o.   MRN: [id number]  This visit type was conducted due to national recommendations for restrictions regarding the COVID-19 Pandemic (e.g. social distancing) in an effort to limit this patient's exposure and mitigate transmission in our community.  Due to their co-morbid illnesses, this patient is at least at moderate risk for complications without adequate follow up.  This format is felt to be most appropriate for this patient at this time.    Documentation for virtual audio and video telecommunications through [place] encounter:  The patient was located at home. The provider was located in the office. The patient did consent to this visit and is aware of possible charges through their insurance for this visit.  The other persons participating in this telemedicine service were none. Time spent on call was 20 minutes and in review of previous records 20 minutes total.  This virtual service is not related to other E/M service within previous 7 days.   HPI Chief Complaint  Patient presents with   Consult    VIRTUAL need documentation for OT/PT regarding back pain (referral has already been placed by [facility]). This was documented in referral...[facility] until she has an OV or Video Visit. Her last one was February, over 60 days ago.     Patient Care Team: Kyrsten Deleeuw, Christiane Cowing, PA-C as PCP - General (Family Medicine) Maximo Spar Aviva Lemmings, MD as PCP - Cardiology (Cardiology) Magrinat, Rozella Cornfield, MD as Consulting Physician (Oncology) Lockie Rima, MD as Consulting Physician (General Surgery) Watson Hacking, MD as Consulting Physician (Family Medicine) Augusto Blonder, MD as Consulting Physician (Neurosurgery) Moira Andrews, MD as Consulting Physician (Dermatology) Debbie Fails, Laura Polio, NP as Nurse Practitioner (Hematology and Oncology) Osa Blase, MD as Consulting Physician  (Orthopedic Surgery) Gerri Kras, MD (Inactive) as Consulting Physician (Anesthesiology) Armbruster, Lendon Queen, MD as Consulting Physician (Gastroenterology) Augusto Blonder, MD as Consulting Physician (Neurosurgery) Phebe Brasil, MD as Consulting Physician (Neurology) Alver Austin, [facility] as Pharmacist (Pharmacist)   Brandi Stephens is a 78 yo female with past medical history of diastolic CHF, COPD on O2, CKD-3A, HTN, HLD, hyp  Virtual today for home health PT/OT referral.  She continues to deal with back pain, difficulty with ambulation, hx/o compression fracture in thoracic region.  She notes pains from mid back to low back.  sees neurosurgery for her spine, and sees phy med, Dr. Rito Chess for help with pain management.   Is going to have to have surgery in the fall 2025.  Was trying to avoid surgery, but after most recent imaging in spring 2025, it appears she will need surgery.  Not able to drive.  Compliant with medicaiton.    She lives down at [facility], but comes back for periods of time in [place].  Her daughter Cathleen Coach lives here in [place] and most of her doctors are here in [place]  No other new problems  Past Medical History:  Diagnosis Date   AIN (anal intraepithelial neoplasia) anal canal    Anemia    with pregnancy   Anxiety    Arthritis    knees, lower back, knees   Chronic constipation    Chronic diastolic CHF (congestive heart failure) (HCC) 07/05/2019   CKD (chronic kidney disease)    Coarse tremors    Essential   COPD (chronic obstructive pulmonary disease) (HCC)    2017 chest CT, history of   Degenerative lumbar spinal  stenosis    Depression    Depression 07/05/2019   Drug dependency (HCC)    Elevated PTHrP level 05/09/2019   EtOH use 07/05/2019   Pt denies heavy drinking however   Gastroparesis    GERD (gastroesophageal reflux disease)    pt denies   Gout    Grade I diastolic dysfunction 02/01/2016   Noted ECHO   Hemorrhoids     History of adenomatous polyp of colon    History of basal cell carcinoma (BCC) excision 2015   nose   History of sepsis 05/14/2014   post lumbar surgery   History of shingles    x2   Hyperlipidemia    Hypertension    Hypothyroidism    Irregular heart beat    history of while undergoing radiation   Malignant neoplasm of upper-inner quadrant of left breast in female, estrogen receptor positive [facility]) oncologist-  dr Charolett Copes--  per lov in epic no recurrence   dx 07-13-2015  Left breast invasive ductal carcinoma, Stage IA, Grade 1 (TXN0),  09-16-2015  s/p  left breast lumpectomy with sln bx's,  completed radiation 11-18-2015,  started antiestrogen therapy 12-14-2015   Neuropathy    Left foot and back of left leg   Obesity (BMI 30-39.9) 07/05/2019   Personal history of radiation therapy    completed 11-18-2015  left breast   Pneumonia    PONV (postoperative nausea and vomiting)    Prediabetes    diet controlled, no med   Restless leg syndrome    Seasonal allergies    Seizure (HCC) 05/2015   due to sepsis, only one time episode   Tremor    Wears partial dentures    lower    Current Outpatient Medications on File Prior to Visit  Medication Sig Dispense Refill   allopurinol  (ZYLOPRIM ) 300 MG tablet TAKE 1 TABLET(300 MG) BY MOUTH DAILY 90 tablet 1   Biotin  3 MG TABS Take 3 mg by mouth daily.     cefadroxil  (DURICEF) 500 MG capsule Take 1 capsule (500 mg total) by mouth 2 (two) times daily. 60 capsule 11   Cholecalciferol (VITAMIN D3) 50 MCG (2000 UT) capsule Take 1 capsule (2,000 Units total) by mouth daily. 90 capsule 3   levothyroxine  (SYNTHROID ) 75 MCG tablet Take 1 tablet (75 mcg total) by mouth daily. 90 tablet 2   pantoprazole  (PROTONIX ) 40 MG tablet Take 1 tablet (40 mg total) by mouth 2 (two) times daily. 60 tablet 1   pregabalin  (LYRICA ) 50 MG capsule Take 1 capsule (50 mg total) by mouth 3 (three) times daily. 270 capsule 3   Propylene Glycol (SYSTANE BALANCE OP) Place 1 drop  into both eyes 4 (four) times daily as needed (dry eyes).     tiZANidine  (ZANAFLEX ) 4 MG tablet Take 1 tablet (4 mg total) by mouth every 6 (six) hours as needed for muscle spasms. 120 tablet 3   traZODone  (DESYREL ) 50 MG tablet Take 1 tablet (50 mg total) by mouth at bedtime. 90 tablet 3   colchicine  0.6 MG tablet TAKE 2 TABLETS BY MOUTH ONCE, THEN 1 TABLET AN HOUR LATER, THEN 1 TABLET TWICE DAILY FOR 3 DAYS (Patient not taking: Reported on 09/07/2023) 30 tablet 2   diazepam  (VALIUM ) 10 MG tablet Take 1 tablet (10 mg total) by mouth daily as needed for anxiety. (Patient not taking: Reported on 09/07/2023) 90 tablet 3   polyethylene glycol powder (GLYCOLAX /MIRALAX ) 17 GM/SCOOP powder Take 17 g by mouth daily as needed for moderate  constipation. (Patient not taking: Reported on 09/07/2023)     rOPINIRole  (REQUIP ) 1 MG tablet TAKE 1 TABLET(1 MG) BY MOUTH AT BEDTIME (Patient not taking: Reported on 09/07/2023) 90 tablet 0   No current facility-administered medications on file prior to visit.     Review of Systems As in subjective    Objective:   Physical Exam Due to coronavirus pandemic stay at home measures, patient visit was virtual and they were not examined in person.    Gen: wd, wn, nad Otherwise not examined   05/26/23 MR thoracic spine IMPRESSION: 1. Remote T12 compression fracture with diffuse sclerosis and no un-healed fracture plane by CT. Superimposed marrow edema is likely from the severe adjacent segment degeneration of T12-L1. Bulging disc crowds the ventral cord at this level and there is left foraminal herniation and impingement. 2. No impingement seen elsewhere in the thoracic spine.   CT lumbar spine wo contrast 05/19/23 IMPRESSION: Degenerative changes and postsurgical changes of the lumbar spine as above. Beam hardening artifact limits evaluation of the spinal canal. Within these limitations, no high-grade spinal canal stenosis.   Re demonstrated compression  fracture of T12 with further interval height loss anteriorly.   Similar lucency about the bilateral L1 pedicle screws concerning for hardware loosening.   Similar osteophyte formation at L3-4 likely narrowing the right lateral recess. Additional osteophytes at L4-5 are increased from prior with likely narrowing of the right lateral recess.   Multilevel foraminal stenosis, greatest and moderate at L1-2.   Increased sclerosis and cyst formation at the T12-L1 endplates likely reflecting degenerative changes.   Aortic Atherosclerosis (ICD10-I70.0).    Assessment:     Encounter Diagnoses  Name Primary?   Chronic pain syndrome Yes   Compression fracture of thoracic vertebra with delayed healing, unspecified thoracic vertebral level, subsequent encounter    Chronic back pain, unspecified back location, unspecified back pain laterality    Essential hypertension    Frequent falls    High risk medication use    Spinal stenosis of lumbar region, unspecified whether neurogenic claudication present    Lumbar spondylosis    Muscle spasm        Plan:     We discussed her symptoms and concerns and I reviewed back over the imaging she had earlier this year.  I do feel she would benefit from physical and Occupational Therapy.  Referral has been made.  She will continue follow-up as planned with neurosurgery and pain management/ physical medicine here in [place].  Her home health therapy will be at [place] [facility] [place]   Activity as tolerated  Continue current medications  We discussed that she will be due back for cardiology follow-up in the fall and she will need preop eval and labs as a get closer to surgery  Jesalyn was seen today for consult.  Diagnoses and all orders for this visit:  Chronic pain syndrome  Compression fracture of thoracic vertebra with delayed healing, unspecified thoracic vertebral level, subsequent encounter  Chronic back pain,  unspecified back location, unspecified back pain laterality  Essential hypertension  Frequent falls  High risk medication use  Spinal stenosis of lumbar region, unspecified whether neurogenic claudication present  Lumbar spondylosis  Muscle spasm    F/u with home health PT/OT

## 2023-09-12 DIAGNOSIS — E1136 Type 2 diabetes mellitus with diabetic cataract: Secondary | ICD-10-CM | POA: Diagnosis not present

## 2023-09-12 DIAGNOSIS — M48061 Spinal stenosis, lumbar region without neurogenic claudication: Secondary | ICD-10-CM | POA: Diagnosis not present

## 2023-09-12 DIAGNOSIS — F411 Generalized anxiety disorder: Secondary | ICD-10-CM | POA: Diagnosis not present

## 2023-09-12 DIAGNOSIS — I493 Ventricular premature depolarization: Secondary | ICD-10-CM | POA: Diagnosis not present

## 2023-09-12 DIAGNOSIS — I130 Hypertensive heart and chronic kidney disease with heart failure and stage 1 through stage 4 chronic kidney disease, or unspecified chronic kidney disease: Secondary | ICD-10-CM | POA: Diagnosis not present

## 2023-09-12 DIAGNOSIS — D539 Nutritional anemia, unspecified: Secondary | ICD-10-CM | POA: Diagnosis not present

## 2023-09-12 DIAGNOSIS — S22088D Other fracture of T11-T12 vertebra, subsequent encounter for fracture with routine healing: Secondary | ICD-10-CM | POA: Diagnosis not present

## 2023-09-12 DIAGNOSIS — E1143 Type 2 diabetes mellitus with diabetic autonomic (poly)neuropathy: Secondary | ICD-10-CM | POA: Diagnosis not present

## 2023-09-12 DIAGNOSIS — J449 Chronic obstructive pulmonary disease, unspecified: Secondary | ICD-10-CM | POA: Diagnosis not present

## 2023-09-12 DIAGNOSIS — G2581 Restless legs syndrome: Secondary | ICD-10-CM | POA: Diagnosis not present

## 2023-09-12 DIAGNOSIS — E039 Hypothyroidism, unspecified: Secondary | ICD-10-CM | POA: Diagnosis not present

## 2023-09-12 DIAGNOSIS — E1122 Type 2 diabetes mellitus with diabetic chronic kidney disease: Secondary | ICD-10-CM | POA: Diagnosis not present

## 2023-09-12 DIAGNOSIS — J302 Other seasonal allergic rhinitis: Secondary | ICD-10-CM | POA: Diagnosis not present

## 2023-09-12 DIAGNOSIS — I700 Atherosclerosis of aorta: Secondary | ICD-10-CM | POA: Diagnosis not present

## 2023-09-12 DIAGNOSIS — K3184 Gastroparesis: Secondary | ICD-10-CM | POA: Diagnosis not present

## 2023-09-12 DIAGNOSIS — M4726 Other spondylosis with radiculopathy, lumbar region: Secondary | ICD-10-CM | POA: Diagnosis not present

## 2023-09-12 DIAGNOSIS — I5042 Chronic combined systolic (congestive) and diastolic (congestive) heart failure: Secondary | ICD-10-CM | POA: Diagnosis not present

## 2023-09-12 DIAGNOSIS — M62838 Other muscle spasm: Secondary | ICD-10-CM | POA: Diagnosis not present

## 2023-09-12 DIAGNOSIS — M109 Gout, unspecified: Secondary | ICD-10-CM | POA: Diagnosis not present

## 2023-09-12 DIAGNOSIS — N1831 Chronic kidney disease, stage 3a: Secondary | ICD-10-CM | POA: Diagnosis not present

## 2023-09-12 DIAGNOSIS — E1142 Type 2 diabetes mellitus with diabetic polyneuropathy: Secondary | ICD-10-CM | POA: Diagnosis not present

## 2023-09-12 DIAGNOSIS — F32A Depression, unspecified: Secondary | ICD-10-CM | POA: Diagnosis not present

## 2023-09-12 DIAGNOSIS — G894 Chronic pain syndrome: Secondary | ICD-10-CM | POA: Diagnosis not present

## 2023-09-12 DIAGNOSIS — I872 Venous insufficiency (chronic) (peripheral): Secondary | ICD-10-CM | POA: Diagnosis not present

## 2023-09-12 DIAGNOSIS — J9611 Chronic respiratory failure with hypoxia: Secondary | ICD-10-CM | POA: Diagnosis not present

## 2023-09-15 ENCOUNTER — Telehealth: Payer: Self-pay

## 2023-09-15 NOTE — Telephone Encounter (Signed)
 Copied from CRM [id number]. Topic: Clinical - Home Health Verbal Orders >> Sep 15, 2023  3:43 PM [name] T wrote: Caller/Agency: Howell Macintosh from [facility] Callback Number: [phone] Service Requested: Physical Therapy Frequency: 2x 3w; 1x 4w Any new concerns about the patient? No

## 2023-09-18 NOTE — Telephone Encounter (Signed)
 Brandi Stephens was given verbal orders

## 2023-09-19 DIAGNOSIS — S22088D Other fracture of T11-T12 vertebra, subsequent encounter for fracture with routine healing: Secondary | ICD-10-CM | POA: Diagnosis not present

## 2023-09-19 DIAGNOSIS — E1143 Type 2 diabetes mellitus with diabetic autonomic (poly)neuropathy: Secondary | ICD-10-CM | POA: Diagnosis not present

## 2023-09-19 DIAGNOSIS — G894 Chronic pain syndrome: Secondary | ICD-10-CM | POA: Diagnosis not present

## 2023-09-19 DIAGNOSIS — E1136 Type 2 diabetes mellitus with diabetic cataract: Secondary | ICD-10-CM | POA: Diagnosis not present

## 2023-09-19 DIAGNOSIS — M4726 Other spondylosis with radiculopathy, lumbar region: Secondary | ICD-10-CM | POA: Diagnosis not present

## 2023-09-19 DIAGNOSIS — I872 Venous insufficiency (chronic) (peripheral): Secondary | ICD-10-CM | POA: Diagnosis not present

## 2023-09-19 DIAGNOSIS — M62838 Other muscle spasm: Secondary | ICD-10-CM | POA: Diagnosis not present

## 2023-09-19 DIAGNOSIS — E1122 Type 2 diabetes mellitus with diabetic chronic kidney disease: Secondary | ICD-10-CM | POA: Diagnosis not present

## 2023-09-19 DIAGNOSIS — I493 Ventricular premature depolarization: Secondary | ICD-10-CM | POA: Diagnosis not present

## 2023-09-19 DIAGNOSIS — E1142 Type 2 diabetes mellitus with diabetic polyneuropathy: Secondary | ICD-10-CM | POA: Diagnosis not present

## 2023-09-19 DIAGNOSIS — I130 Hypertensive heart and chronic kidney disease with heart failure and stage 1 through stage 4 chronic kidney disease, or unspecified chronic kidney disease: Secondary | ICD-10-CM | POA: Diagnosis not present

## 2023-09-19 DIAGNOSIS — N1831 Chronic kidney disease, stage 3a: Secondary | ICD-10-CM | POA: Diagnosis not present

## 2023-09-19 DIAGNOSIS — I700 Atherosclerosis of aorta: Secondary | ICD-10-CM | POA: Diagnosis not present

## 2023-09-19 DIAGNOSIS — D539 Nutritional anemia, unspecified: Secondary | ICD-10-CM | POA: Diagnosis not present

## 2023-09-19 DIAGNOSIS — F411 Generalized anxiety disorder: Secondary | ICD-10-CM | POA: Diagnosis not present

## 2023-09-19 DIAGNOSIS — F32A Depression, unspecified: Secondary | ICD-10-CM | POA: Diagnosis not present

## 2023-09-19 DIAGNOSIS — J449 Chronic obstructive pulmonary disease, unspecified: Secondary | ICD-10-CM | POA: Diagnosis not present

## 2023-09-19 DIAGNOSIS — E039 Hypothyroidism, unspecified: Secondary | ICD-10-CM | POA: Diagnosis not present

## 2023-09-19 DIAGNOSIS — G2581 Restless legs syndrome: Secondary | ICD-10-CM | POA: Diagnosis not present

## 2023-09-19 DIAGNOSIS — I5042 Chronic combined systolic (congestive) and diastolic (congestive) heart failure: Secondary | ICD-10-CM | POA: Diagnosis not present

## 2023-09-19 DIAGNOSIS — M109 Gout, unspecified: Secondary | ICD-10-CM | POA: Diagnosis not present

## 2023-09-19 DIAGNOSIS — M48061 Spinal stenosis, lumbar region without neurogenic claudication: Secondary | ICD-10-CM | POA: Diagnosis not present

## 2023-09-19 DIAGNOSIS — K3184 Gastroparesis: Secondary | ICD-10-CM | POA: Diagnosis not present

## 2023-09-19 DIAGNOSIS — J9611 Chronic respiratory failure with hypoxia: Secondary | ICD-10-CM | POA: Diagnosis not present

## 2023-09-19 DIAGNOSIS — J302 Other seasonal allergic rhinitis: Secondary | ICD-10-CM | POA: Diagnosis not present

## 2023-09-24 DIAGNOSIS — S0990XA Unspecified injury of head, initial encounter: Secondary | ICD-10-CM | POA: Diagnosis not present

## 2023-09-24 DIAGNOSIS — S199XXA Unspecified injury of neck, initial encounter: Secondary | ICD-10-CM | POA: Diagnosis not present

## 2023-09-24 DIAGNOSIS — S3991XA Unspecified injury of abdomen, initial encounter: Secondary | ICD-10-CM | POA: Diagnosis not present

## 2023-09-24 DIAGNOSIS — S3993XA Unspecified injury of pelvis, initial encounter: Secondary | ICD-10-CM | POA: Diagnosis not present

## 2023-09-24 DIAGNOSIS — W08XXXA Fall from other furniture, initial encounter: Secondary | ICD-10-CM | POA: Diagnosis not present

## 2023-09-24 DIAGNOSIS — S299XXA Unspecified injury of thorax, initial encounter: Secondary | ICD-10-CM | POA: Diagnosis not present

## 2023-09-24 DIAGNOSIS — R5381 Other malaise: Secondary | ICD-10-CM | POA: Diagnosis not present

## 2023-09-25 ENCOUNTER — Telehealth: Payer: Self-pay

## 2023-09-25 NOTE — Transitions of Care (Post Inpatient/ED Visit) (Signed)
   09/25/2023  Name: Brandi Stephens MRN: [id number] DOB: [birth year]  Today's TOC FU Call Status: Today's TOC FU Call Status:: Unsuccessful Call (1st Attempt) Unsuccessful Call (1st Attempt) Date: 09/25/23  Attempted to reach the patient regarding the most recent Inpatient/ED visit. Unable to retrieve or review any records related to an admission in [facility]. Last PCP visit was 09/07/23 for Home Health orders. Patient lives in [place], [place].   Follow Up Plan: Additional outreach attempts will be made to reach the patient to complete the Transitions of Care (Post Inpatient/ED visit) call.    Katheryn Pandy MSN, RN RN Case Sales executive Health  VBCI-Population Health Office Hours M-F [phone] Direct Dial: [phone] Main Phone [phone]  Fax: [phone] [email or web address]

## 2023-09-26 ENCOUNTER — Telehealth: Payer: Self-pay

## 2023-09-26 NOTE — Progress Notes (Unsigned)
 This encounter was created in error - please disregard.

## 2023-09-26 NOTE — Transitions of Care (Post Inpatient/ED Visit) (Signed)
   09/26/2023  Name: Brandi Stephens MRN: [id number] DOB: Mar 28, 1946  Today's TOC FU Call Status: Today's TOC FU Call Status:: Unsuccessful Call (2nd Attempt) Unsuccessful Call (2nd Attempt) Date: 09/26/23  Attempted to reach the patient regarding the most recent Inpatient/ED visit.  Follow Up Plan: Additional outreach attempts will be made to reach the patient to complete the Transitions of Care (Post Inpatient/ED visit) call.   Tonia Frankel RN, CCM [place]  VBCI-Population Health RN Care Manager [phone]

## 2023-09-27 ENCOUNTER — Telehealth: Payer: Self-pay

## 2023-09-27 NOTE — Transitions of Care (Post Inpatient/ED Visit) (Signed)
   09/27/2023  Name: Brandi Stephens MRN: [id number] DOB: 1945-05-29  Today's TOC FU Call Status: Today's TOC FU Call Status:: Unsuccessful Call (3rd Attempt) Unsuccessful Call (3rd Attempt) Date: 09/27/23  Attempted to reach the patient regarding the most recent Inpatient/ED visit.  Follow Up Plan: No further outreach attempts will be made at this time. We have been unable to contact the patient.   Katheryn Pandy MSN, RN RN Case Sales executive Health  VBCI-Population Health Office Hours M-F [phone] Direct Dial: [phone] Main Phone [phone]  Fax: [phone] [email or web address]

## 2023-09-29 ENCOUNTER — Ambulatory Visit: Payer: Medicare HMO | Admitting: Physical Medicine and Rehabilitation

## 2023-10-01 DIAGNOSIS — M109 Gout, unspecified: Secondary | ICD-10-CM | POA: Diagnosis not present

## 2023-10-01 DIAGNOSIS — I872 Venous insufficiency (chronic) (peripheral): Secondary | ICD-10-CM | POA: Diagnosis not present

## 2023-10-01 DIAGNOSIS — I493 Ventricular premature depolarization: Secondary | ICD-10-CM | POA: Diagnosis not present

## 2023-10-01 DIAGNOSIS — M62838 Other muscle spasm: Secondary | ICD-10-CM | POA: Diagnosis not present

## 2023-10-01 DIAGNOSIS — J449 Chronic obstructive pulmonary disease, unspecified: Secondary | ICD-10-CM | POA: Diagnosis not present

## 2023-10-01 DIAGNOSIS — G894 Chronic pain syndrome: Secondary | ICD-10-CM | POA: Diagnosis not present

## 2023-10-01 DIAGNOSIS — J9611 Chronic respiratory failure with hypoxia: Secondary | ICD-10-CM | POA: Diagnosis not present

## 2023-10-01 DIAGNOSIS — I130 Hypertensive heart and chronic kidney disease with heart failure and stage 1 through stage 4 chronic kidney disease, or unspecified chronic kidney disease: Secondary | ICD-10-CM | POA: Diagnosis not present

## 2023-10-01 DIAGNOSIS — E1122 Type 2 diabetes mellitus with diabetic chronic kidney disease: Secondary | ICD-10-CM | POA: Diagnosis not present

## 2023-10-01 DIAGNOSIS — E039 Hypothyroidism, unspecified: Secondary | ICD-10-CM | POA: Diagnosis not present

## 2023-10-01 DIAGNOSIS — I5042 Chronic combined systolic (congestive) and diastolic (congestive) heart failure: Secondary | ICD-10-CM | POA: Diagnosis not present

## 2023-10-01 DIAGNOSIS — F32A Depression, unspecified: Secondary | ICD-10-CM | POA: Diagnosis not present

## 2023-10-01 DIAGNOSIS — K3184 Gastroparesis: Secondary | ICD-10-CM | POA: Diagnosis not present

## 2023-10-01 DIAGNOSIS — J302 Other seasonal allergic rhinitis: Secondary | ICD-10-CM | POA: Diagnosis not present

## 2023-10-01 DIAGNOSIS — F411 Generalized anxiety disorder: Secondary | ICD-10-CM | POA: Diagnosis not present

## 2023-10-01 DIAGNOSIS — S22088D Other fracture of T11-T12 vertebra, subsequent encounter for fracture with routine healing: Secondary | ICD-10-CM | POA: Diagnosis not present

## 2023-10-01 DIAGNOSIS — I700 Atherosclerosis of aorta: Secondary | ICD-10-CM | POA: Diagnosis not present

## 2023-10-01 DIAGNOSIS — M4726 Other spondylosis with radiculopathy, lumbar region: Secondary | ICD-10-CM | POA: Diagnosis not present

## 2023-10-01 DIAGNOSIS — E1142 Type 2 diabetes mellitus with diabetic polyneuropathy: Secondary | ICD-10-CM | POA: Diagnosis not present

## 2023-10-01 DIAGNOSIS — M48061 Spinal stenosis, lumbar region without neurogenic claudication: Secondary | ICD-10-CM | POA: Diagnosis not present

## 2023-10-01 DIAGNOSIS — N1831 Chronic kidney disease, stage 3a: Secondary | ICD-10-CM | POA: Diagnosis not present

## 2023-10-01 DIAGNOSIS — E1143 Type 2 diabetes mellitus with diabetic autonomic (poly)neuropathy: Secondary | ICD-10-CM | POA: Diagnosis not present

## 2023-10-01 DIAGNOSIS — D539 Nutritional anemia, unspecified: Secondary | ICD-10-CM | POA: Diagnosis not present

## 2023-10-01 DIAGNOSIS — G2581 Restless legs syndrome: Secondary | ICD-10-CM | POA: Diagnosis not present

## 2023-10-01 DIAGNOSIS — E1136 Type 2 diabetes mellitus with diabetic cataract: Secondary | ICD-10-CM | POA: Diagnosis not present

## 2023-10-02 ENCOUNTER — Telehealth: Payer: Self-pay | Admitting: Internal Medicine

## 2023-10-02 NOTE — Telephone Encounter (Unsigned)
 Copied from CRM [id number]. Topic: Clinical - Home Health Verbal Orders >> Oct 02, 2023  2:42 PM Everlene Hobby D wrote: Caller/Agency: [facility] [place] Number: [phone] Service Requested: Skilled Nursing- for resumption of care Frequency: 1 once a week for 3 weeks, once every other week until November 10, 2023 Any new concerns about the patient? Yes Standing urine  order to UTI for early treatment and detachment

## 2023-10-03 NOTE — Telephone Encounter (Signed)
Brandi Stephens was given verbal orders.

## 2023-10-04 DIAGNOSIS — D539 Nutritional anemia, unspecified: Secondary | ICD-10-CM | POA: Diagnosis not present

## 2023-10-04 DIAGNOSIS — I5042 Chronic combined systolic (congestive) and diastolic (congestive) heart failure: Secondary | ICD-10-CM | POA: Diagnosis not present

## 2023-10-04 DIAGNOSIS — F411 Generalized anxiety disorder: Secondary | ICD-10-CM | POA: Diagnosis not present

## 2023-10-04 DIAGNOSIS — E1142 Type 2 diabetes mellitus with diabetic polyneuropathy: Secondary | ICD-10-CM | POA: Diagnosis not present

## 2023-10-04 DIAGNOSIS — F32A Depression, unspecified: Secondary | ICD-10-CM | POA: Diagnosis not present

## 2023-10-04 DIAGNOSIS — J9611 Chronic respiratory failure with hypoxia: Secondary | ICD-10-CM | POA: Diagnosis not present

## 2023-10-04 DIAGNOSIS — I872 Venous insufficiency (chronic) (peripheral): Secondary | ICD-10-CM | POA: Diagnosis not present

## 2023-10-04 DIAGNOSIS — N1831 Chronic kidney disease, stage 3a: Secondary | ICD-10-CM | POA: Diagnosis not present

## 2023-10-04 DIAGNOSIS — J449 Chronic obstructive pulmonary disease, unspecified: Secondary | ICD-10-CM | POA: Diagnosis not present

## 2023-10-04 DIAGNOSIS — I700 Atherosclerosis of aorta: Secondary | ICD-10-CM | POA: Diagnosis not present

## 2023-10-04 DIAGNOSIS — G894 Chronic pain syndrome: Secondary | ICD-10-CM | POA: Diagnosis not present

## 2023-10-04 DIAGNOSIS — J302 Other seasonal allergic rhinitis: Secondary | ICD-10-CM | POA: Diagnosis not present

## 2023-10-04 DIAGNOSIS — K3184 Gastroparesis: Secondary | ICD-10-CM | POA: Diagnosis not present

## 2023-10-04 DIAGNOSIS — E1136 Type 2 diabetes mellitus with diabetic cataract: Secondary | ICD-10-CM | POA: Diagnosis not present

## 2023-10-04 DIAGNOSIS — M48061 Spinal stenosis, lumbar region without neurogenic claudication: Secondary | ICD-10-CM | POA: Diagnosis not present

## 2023-10-04 DIAGNOSIS — M4726 Other spondylosis with radiculopathy, lumbar region: Secondary | ICD-10-CM | POA: Diagnosis not present

## 2023-10-04 DIAGNOSIS — E1122 Type 2 diabetes mellitus with diabetic chronic kidney disease: Secondary | ICD-10-CM | POA: Diagnosis not present

## 2023-10-04 DIAGNOSIS — I130 Hypertensive heart and chronic kidney disease with heart failure and stage 1 through stage 4 chronic kidney disease, or unspecified chronic kidney disease: Secondary | ICD-10-CM | POA: Diagnosis not present

## 2023-10-04 DIAGNOSIS — M62838 Other muscle spasm: Secondary | ICD-10-CM | POA: Diagnosis not present

## 2023-10-04 DIAGNOSIS — I493 Ventricular premature depolarization: Secondary | ICD-10-CM | POA: Diagnosis not present

## 2023-10-04 DIAGNOSIS — E039 Hypothyroidism, unspecified: Secondary | ICD-10-CM | POA: Diagnosis not present

## 2023-10-04 DIAGNOSIS — S22088D Other fracture of T11-T12 vertebra, subsequent encounter for fracture with routine healing: Secondary | ICD-10-CM | POA: Diagnosis not present

## 2023-10-04 DIAGNOSIS — E1143 Type 2 diabetes mellitus with diabetic autonomic (poly)neuropathy: Secondary | ICD-10-CM | POA: Diagnosis not present

## 2023-10-04 DIAGNOSIS — G2581 Restless legs syndrome: Secondary | ICD-10-CM | POA: Diagnosis not present

## 2023-10-04 DIAGNOSIS — M109 Gout, unspecified: Secondary | ICD-10-CM | POA: Diagnosis not present

## 2023-10-09 DIAGNOSIS — M4805 Spinal stenosis, thoracolumbar region: Secondary | ICD-10-CM | POA: Diagnosis not present

## 2023-10-09 DIAGNOSIS — M48061 Spinal stenosis, lumbar region without neurogenic claudication: Secondary | ICD-10-CM | POA: Diagnosis not present

## 2023-10-17 ENCOUNTER — Encounter: Payer: Self-pay | Admitting: Medical

## 2023-10-17 ENCOUNTER — Ambulatory Visit (INDEPENDENT_AMBULATORY_CARE_PROVIDER_SITE_OTHER): Admitting: Medical

## 2023-10-17 VITALS — BP 120/68 | HR 90 | Wt 156.0 lb

## 2023-10-17 DIAGNOSIS — W19XXXA Unspecified fall, initial encounter: Secondary | ICD-10-CM

## 2023-10-17 DIAGNOSIS — Z79899 Other long term (current) drug therapy: Secondary | ICD-10-CM

## 2023-10-17 DIAGNOSIS — R7989 Other specified abnormal findings of blood chemistry: Secondary | ICD-10-CM | POA: Diagnosis not present

## 2023-10-17 DIAGNOSIS — N390 Urinary tract infection, site not specified: Secondary | ICD-10-CM

## 2023-10-17 DIAGNOSIS — R296 Repeated falls: Secondary | ICD-10-CM | POA: Diagnosis not present

## 2023-10-17 DIAGNOSIS — I951 Orthostatic hypotension: Secondary | ICD-10-CM | POA: Diagnosis not present

## 2023-10-17 DIAGNOSIS — R42 Dizziness and giddiness: Secondary | ICD-10-CM | POA: Diagnosis not present

## 2023-10-17 DIAGNOSIS — Z66 Do not resuscitate: Secondary | ICD-10-CM

## 2023-10-17 DIAGNOSIS — R4701 Aphasia: Secondary | ICD-10-CM

## 2023-10-17 MED ORDER — COLCHICINE 0.6 MG PO TABS: ORAL_TABLET | ORAL | 2 refills | Status: DC

## 2023-10-17 MED ORDER — ALLOPURINOL 300 MG PO TABS: 300.0000 mg | ORAL_TABLET | Freq: Every day | ORAL | 1 refills | Status: DC

## 2023-10-17 NOTE — Progress Notes (Unsigned)
 Subjective:  Brandi Stephens is a 78 y.o. female who presents for Chief Complaint  Patient presents with   Hospitalization Follow-up    [place]     Here with daughter.   Because of her many health issues she is moving back to [name] to be closer to her daughter and family  She was recently hospitalized June 8 through September 29, 2023  Primary diagnosis is urinary tract infection. Secondary diagnoses were: Primary hypertension Hypotension Shock liver Acute kidney injury Back pain  Neuropathy Fall DNR  She presented with orthostatic hypotension, fall, and recent hallucinations and delusions.  She had multiple falls recently as well.  She was given midodrine , antibiotics, received fluids for retention, was given Rocephin in the hospital and continued on Keflex  during and post hospital.  Urine was positive for Klebsiella sensitive to Keflex .  She was put back on losartan/HCTZ temporarily given history of hypertension  It was thought that her liver test elevation was due to ischemic hepatopathy due to the profound hypertension.  She apparently had an unremarkable CT and ultrasound  Acute kidney injury resolved with IV fluids  Her back pain is chronic and she continues on Lyrica  and Zanaflex  and Lidoderm  patches, ibuprofen,  She continues on Lyrica  for neuropathy   Current symptoms: She reports feeling much better since hospitalization.  She still is having periods of dizziness and lightheadedness.  She notes that her blood pressure got high during the hospitalization because of no pain medication the first 32 hours she was there.  She is not currently on the blood pressure medicines that were restarted during the hospitalization given prior low blood pressure in recent months.  Home health came out last week and her blood pressure was too low so she discontinued the blood pressure medications losartan HCT  She is finishing out  the Keflex .  Since her well home health has been her home health provider.  She needs a new referral today for therapy here in [place] since she is relocating here.  She needs speech therapy given aphasia, PT and OT.  She is actually living with family in the [place] close to [name] currently  She currently does not have a glucometer  No other aggravating or relieving factors.    No other c/o.  Past Medical History:  Diagnosis Date   AIN (anal intraepithelial neoplasia) anal canal    Anemia    with pregnancy   Anxiety    Arthritis    knees, lower back, knees   Chronic constipation    Chronic diastolic CHF (congestive heart failure) (HCC) 07/05/2019   CKD (chronic kidney disease)    Coarse tremors    Essential   COPD (chronic obstructive pulmonary disease) (HCC)    2017 chest CT, history of   Degenerative lumbar spinal stenosis    Depression    Depression 07/05/2019   Drug dependency (HCC)    Elevated PTHrP level 05/09/2019   EtOH use 07/05/2019   Pt denies heavy drinking however   Gastroparesis    GERD (gastroesophageal reflux disease)    pt denies   Gout    Grade I diastolic dysfunction 02/01/2016   Noted ECHO   Hemorrhoids    History of adenomatous polyp of colon    History of basal cell carcinoma (BCC) excision 2015   nose   History of sepsis 05/14/2014   post lumbar surgery   History of shingles    x2  Hyperlipidemia    Hypertension    Hypothyroidism    Irregular heart beat    history of while undergoing radiation   Malignant neoplasm of upper-inner quadrant of left breast in female, estrogen receptor positive [facility]) oncologist-  dr layla--  per lov in epic no recurrence   dx 07-13-2015  Left breast invasive ductal carcinoma, Stage IA, Grade 1 (TXN0),  09-16-2015  s/p  left breast lumpectomy with sln bx's,  completed radiation 11-18-2015,  started antiestrogen therapy 12-14-2015   Neuropathy    Left foot and back of left leg   Obesity (BMI  30-39.9) 07/05/2019   Personal history of radiation therapy    completed 11-18-2015  left breast   Pneumonia    PONV (postoperative nausea and vomiting)    Prediabetes    diet controlled, no med   Restless leg syndrome    Seasonal allergies    Seizure (HCC) 05/2015   due to sepsis, only one time episode   Tremor    Wears partial dentures    lower    Current Outpatient Medications on File Prior to Visit  Medication Sig Dispense Refill   Biotin  3 MG TABS Take 3 mg by mouth daily.     cefadroxil  (DURICEF) 500 MG capsule Take 1 capsule (500 mg total) by mouth 2 (two) times daily. 60 capsule 11   cephALEXin  (KEFLEX ) 250 MG capsule Take 500 mg by mouth 2 (two) times daily.     Cholecalciferol (VITAMIN D3) 50 MCG (2000 UT) capsule Take 1 capsule (2,000 Units total) by mouth daily. 90 capsule 3   hydrochlorothiazide  (HYDRODIURIL ) 25 MG tablet Take 12.5 mg by mouth daily.     ibuprofen (ADVIL) 200 MG tablet Take 200 mg by mouth every 6 (six) hours as needed for mild pain (pain score 1-3).     levothyroxine  (SYNTHROID ) 75 MCG tablet Take 1 tablet (75 mcg total) by mouth daily. 90 tablet 2   lidocaine  (HM LIDOCAINE  PATCH) 4 % Place 1 patch onto the skin daily.     losartan (COZAAR) 25 MG tablet Take 25 mg by mouth daily.     polyethylene glycol powder (GLYCOLAX /MIRALAX ) 17 GM/SCOOP powder Take 17 g by mouth daily as needed for moderate constipation.     pregabalin  (LYRICA ) 50 MG capsule Take 1 capsule (50 mg total) by mouth 3 (three) times daily. 270 capsule 3   Propylene Glycol (SYSTANE BALANCE OP) Place 1 drop into both eyes 4 (four) times daily as needed (dry eyes).     tiZANidine  (ZANAFLEX ) 4 MG tablet Take 1 tablet (4 mg total) by mouth every 6 (six) hours as needed for muscle spasms. 120 tablet 3   traZODone  (DESYREL ) 50 MG tablet Take 1 tablet (50 mg total) by mouth at bedtime. 90 tablet 3   diazepam  (VALIUM ) 10 MG tablet Take 1 tablet (10 mg total) by mouth daily as needed for anxiety.  (Patient not taking: Reported on 10/17/2023) 90 tablet 3   pantoprazole  (PROTONIX ) 40 MG tablet Take 1 tablet (40 mg total) by mouth 2 (two) times daily. 60 tablet 1   rOPINIRole  (REQUIP ) 1 MG tablet TAKE 1 TABLET(1 MG) BY MOUTH AT BEDTIME (Patient not taking: Reported on 10/17/2023) 90 tablet 0   No current facility-administered medications on file prior to visit.    The following portions of the patient's history were reviewed and updated as appropriate: allergies, current medications, past family history, past medical history, past social history, past surgical history and problem list.  ROS Otherwise as in subjective above    Objective: BP 120/68   Pulse 90   Wt 156 lb (70.8 kg)   SpO2 98%   BMI 24.43 kg/m   Wt Readings from Last 3 Encounters:  10/17/23 156 lb (70.8 kg)  09/07/23 152 lb (68.9 kg)  06/26/23 150 lb (68 kg)   BP Readings from Last 3 Encounters:  10/17/23 120/68  09/07/23 (!) 105/52  06/26/23 128/86    General appearance: alert, no distress, well developed, well nourished, using a walker HEENT: normocephalic, sclerae anicteric, conjunctiva pink and moist, TMs pearly, nares patent, no discharge or erythema, pharynx normal Oral cavity: MMM, no lesions Neck: supple, no lymphadenopathy, no thyromegaly, no masses Heart: RRR, normal S1, S2, no murmurs Lungs: CTA bilaterally, no wheezes, rhonchi, or rales Pulses: 2+ radial pulses, 2+ pedal pulses, normal cap refill Ext: no edema   Assessment: Encounter Diagnoses  Name Primary?   Urinary tract infection without hematuria, site unspecified Yes   Orthostatic hypotension    Frequent falls    High risk medication use    Elevated LFTs    Aphasia    Dizziness    DNR (do not resuscitate)      Plan: I reviewed over her recent hospital discharge summary inflammation She is finishing Keflex  antibiotic for your recent urinary tract infection She was not able to urinate today to recheck the urine We got labs  today although they were rushed to get out of here to meet another appointment they apparently have scheduled Labs as below Referral to home health for therapy here locally since she is back in this area now.  Referral includes speech therapy, Occupational Therapy, physical therapy  We discussed her concern about ongoing dizziness.  She is not currently on antihypertensives.  She discontinued them last week.  We discussed that dizziness and lightheadedness can be related to low blood pressure, low blood sugar, medication side effect, autonomic dysfunction and other  New prescription today for glucometer so she can monitor her blood sugars to watch her hypoglycemia  Advised blood pressure monitoring at home particular given recent hypotension requiring fluids and midodrine  in the hospital for shock  We discussed the fact that her to primary pain medicines Lyrica  and tizanidine  can both cause dizziness that she is reluctant to change those since they are her primary pain medicines, prescribed by pain clinic.   Catori was seen today for hospitalization follow-up.  Diagnoses and all orders for this visit:  Urinary tract infection without hematuria, site unspecified  Orthostatic hypotension  Frequent falls -     Ambulatory referral to Home Health  High risk medication use -     Ambulatory referral to Home Health -     Comprehensive metabolic panel with GFR -     Cancel: Urinalysis, Routine w reflex microscopic -     TSH + free T4  Elevated LFTs -     Comprehensive metabolic panel with GFR  Aphasia -     Ambulatory referral to Home Health  Dizziness  DNR (do not resuscitate)  Other orders -     allopurinol  (ZYLOPRIM ) 300 MG tablet; Take 1 tablet (300 mg total) by mouth daily. -     colchicine  0.6 MG tablet; TAKE 2 TABLETS BY MOUTH ONCE, THEN 1 TABLET AN HOUR LATER, THEN 1 TABLET TWICE DAILY FOR 3 DAYS    Follow up: Pending labs

## 2023-10-18 LAB — COMPREHENSIVE METABOLIC PANEL WITH GFR
ALT: 589 IU/L (ref 0–32)
AST: 394 IU/L — ABNORMAL HIGH (ref 0–40)
Albumin: 3.9 g/dL (ref 3.8–4.8)
Alkaline Phosphatase: 85 IU/L (ref 44–121)
BUN/Creatinine Ratio: 25 (ref 12–28)
BUN: 29 mg/dL — ABNORMAL HIGH (ref 8–27)
Bilirubin Total: 1.1 mg/dL (ref 0.0–1.2)
CO2: 19 mmol/L — ABNORMAL LOW (ref 20–29)
Calcium: 9.5 mg/dL (ref 8.7–10.3)
Chloride: 106 mmol/L (ref 96–106)
Creatinine, Ser: 1.14 mg/dL — ABNORMAL HIGH (ref 0.57–1.00)
Globulin, Total: 1.6 g/dL (ref 1.5–4.5)
Glucose: 89 mg/dL (ref 70–99)
Potassium: 4.4 mmol/L (ref 3.5–5.2)
Sodium: 139 mmol/L (ref 134–144)
Total Protein: 5.5 g/dL — ABNORMAL LOW (ref 6.0–8.5)
eGFR: 50 mL/min/{1.73_m2} — ABNORMAL LOW (ref >59)

## 2023-10-18 LAB — TSH+FREE T4
Free T4: 1.37 ng/dL (ref 0.82–1.77)
TSH: 4.76 u[IU]/mL — ABNORMAL HIGH (ref 0.450–4.500)

## 2023-10-19 ENCOUNTER — Ambulatory Visit: Payer: Self-pay | Admitting: Medical

## 2023-10-19 ENCOUNTER — Other Ambulatory Visit: Payer: Self-pay | Admitting: Internal Medicine

## 2023-10-19 ENCOUNTER — Other Ambulatory Visit: Payer: Self-pay | Admitting: Medical

## 2023-10-19 DIAGNOSIS — R7989 Other specified abnormal findings of blood chemistry: Secondary | ICD-10-CM

## 2023-10-19 DIAGNOSIS — N390 Urinary tract infection, site not specified: Secondary | ICD-10-CM

## 2023-10-19 NOTE — Progress Notes (Signed)
 See if lab can add an acute hepatitis panel.  If not she may need to come back by for repeat labs today.  Kidney marker is trending back towards baseline, liver test still quite high, thyroid  okay.  Other than the prescription medications that she is taking Tylenol  or any other over-the-counter medicine right now?  Any alcohol ?  How are her symptoms today?  Any worse symptoms, any confusion, any yellowish or skin or eyes?

## 2023-10-23 ENCOUNTER — Other Ambulatory Visit: Payer: Self-pay | Admitting: Medical

## 2023-10-23 ENCOUNTER — Other Ambulatory Visit

## 2023-10-23 DIAGNOSIS — R7989 Other specified abnormal findings of blood chemistry: Secondary | ICD-10-CM

## 2023-10-23 DIAGNOSIS — N390 Urinary tract infection, site not specified: Secondary | ICD-10-CM

## 2023-10-24 ENCOUNTER — Ambulatory Visit: Payer: Self-pay | Admitting: Medical

## 2023-10-24 LAB — MICROSCOPIC EXAMINATION
Casts: NONE SEEN /LPF
Epithelial Cells (non renal): >10 /HPF — AB (ref 0–10)
RBC, Urine: NONE SEEN /HPF (ref 0–2)

## 2023-10-24 LAB — CBC
Hematocrit: 42 % (ref 34.0–46.6)
Hemoglobin: 13.6 g/dL (ref 11.1–15.9)
MCH: 32.5 pg (ref 26.6–33.0)
MCHC: 32.4 g/dL (ref 31.5–35.7)
MCV: 100 fL — ABNORMAL HIGH (ref 79–97)
Platelets: 123 x10E3/uL — ABNORMAL LOW (ref 150–450)
RBC: 4.19 x10E6/uL (ref 3.77–5.28)
RDW: 12.6 % (ref 11.7–15.4)
WBC: 12.1 x10E3/uL — ABNORMAL HIGH (ref 3.4–10.8)

## 2023-10-24 LAB — COMPREHENSIVE METABOLIC PANEL WITH GFR
ALT: 589 IU/L (ref 0–32)
AST: 432 IU/L — ABNORMAL HIGH (ref 0–40)
Albumin: 3.7 g/dL — ABNORMAL LOW (ref 3.8–4.8)
Alkaline Phosphatase: 96 IU/L (ref 44–121)
BUN/Creatinine Ratio: 26 (ref 12–28)
BUN: 27 mg/dL (ref 8–27)
Bilirubin Total: 1.6 mg/dL — ABNORMAL HIGH (ref 0.0–1.2)
CO2: 21 mmol/L (ref 20–29)
Calcium: 9.4 mg/dL (ref 8.7–10.3)
Chloride: 105 mmol/L (ref 96–106)
Creatinine, Ser: 1.02 mg/dL — ABNORMAL HIGH (ref 0.57–1.00)
Globulin, Total: 1.8 g/dL (ref 1.5–4.5)
Glucose: 88 mg/dL (ref 70–99)
Potassium: 4.3 mmol/L (ref 3.5–5.2)
Sodium: 140 mmol/L (ref 134–144)
Total Protein: 5.5 g/dL — ABNORMAL LOW (ref 6.0–8.5)
eGFR: 57 mL/min/1.73 — ABNORMAL LOW (ref >59)

## 2023-10-24 LAB — URINALYSIS, ROUTINE W REFLEX MICROSCOPIC
Bilirubin, UA: NEGATIVE
Glucose, UA: NEGATIVE
Ketones, UA: NEGATIVE
Nitrite, UA: NEGATIVE
Protein,UA: NEGATIVE
RBC, UA: NEGATIVE
Specific Gravity, UA: 1.015 (ref 1.005–1.030)
Urobilinogen, Ur: 1 mg/dL (ref 0.2–1.0)
pH, UA: 6.5 (ref 5.0–7.5)

## 2023-10-24 NOTE — Progress Notes (Signed)
 Dr. Leigh, I was curious if you can get her worked in the next few days to help further evaluate the elevated liver test.  She was hospitalized recently, almost went into sepsis.  Had profound hypotension.  Liver tests don't really look any better than last week.  She is back living in this area currently.  She was living down close to [place] but just moved back to [place] area  Sabrina-call about results   Liver tests are still quite elevated.  Blood count up a little bit.  What does the blood pressure look like today and yesterday?  Your elevated liver test in the hospitalization recently was thought to be related to profound hypotension and shock.  But so far the liver test is not improving.

## 2023-10-25 ENCOUNTER — Other Ambulatory Visit: Payer: Self-pay | Admitting: Internal Medicine

## 2023-10-25 ENCOUNTER — Other Ambulatory Visit: Payer: Self-pay

## 2023-10-25 DIAGNOSIS — R7989 Other specified abnormal findings of blood chemistry: Secondary | ICD-10-CM

## 2023-10-25 NOTE — Progress Notes (Signed)
 Liver test still remain quite high.  Urine culture also showing possible infection still.  When was the last day she used antibiotic?  Is she having any urinary symptoms such as burning with urination, cloudy urine, odorous urine, blood in the urine, lower belly discomfort?  Her liver doctor is trying to get a hold of her to do some additional tests.  She needs to call them back about coming in for lab and ultrasound

## 2023-10-25 NOTE — Progress Notes (Signed)
 Hello Dr. Leigh  The LFTs are basically unchanged, not better, not worse Her discharge summary is in the EMR record under media, 09/29/2023 [facility] discharge summary  They mention that she had a CT and ultrasound which was unremarkable for the liver but I do not see the actual document or imaging in our EMR system  As of yesterday her blood pressure looks better at home  The discharge summary shows that she was on Keflex  250 mg, 4 tablets daily  She continues to have hypotension issues which I think are somewhat related to her Lyrica  and tizanidine  use that she uses both multiple times daily for pain management.  She was on even more pain medication prior, but some of that has been widdled away over time.   She seems very reluctant to discontinuing or even reducing down her Lyrica  or tizanidine  usage.  She sees physical medicine, pain management.  Hopefully your office can get her to come in for lab repeat ASAP.    Thanks for your help. Ludie

## 2023-10-25 NOTE — Progress Notes (Signed)
 It may be worth having her see a urologist given the ongoing urinary findings.  Does she have a urologist?  If not go ahead put a referral into urology as well  In the meantime if still having symptoms and give the urine findings we may need to try different type of antibiotic such as Cipro  or Bactrim .  I am going to copy Dr. Leigh on this to get his opinion on adding 1 of these antibiotics

## 2023-10-26 ENCOUNTER — Other Ambulatory Visit: Payer: Self-pay | Admitting: Internal Medicine

## 2023-10-26 ENCOUNTER — Other Ambulatory Visit: Payer: Self-pay | Admitting: Medical

## 2023-10-26 DIAGNOSIS — N390 Urinary tract infection, site not specified: Secondary | ICD-10-CM

## 2023-10-26 LAB — URINE CULTURE

## 2023-10-26 MED ORDER — CIPROFLOXACIN HCL 500 MG PO TABS: 500.0000 mg | ORAL_TABLET | Freq: Two times a day (BID) | ORAL | 0 refills | Status: AC

## 2023-10-27 ENCOUNTER — Telehealth: Payer: Self-pay | Admitting: Internal Medicine

## 2023-10-27 ENCOUNTER — Encounter: Payer: Self-pay | Admitting: Medical

## 2023-10-27 DIAGNOSIS — R7401 Elevation of levels of liver transaminase levels: Secondary | ICD-10-CM | POA: Insufficient documentation

## 2023-10-27 LAB — ACUTE VIRAL HEPATITIS (HAV, HBV, HCV)
HCV Ab: NONREACTIVE
Hep A IgM: NEGATIVE
Hep B C IgM: NEGATIVE
Hepatitis B Surface Ag: NEGATIVE

## 2023-10-27 LAB — HCV INTERPRETATION

## 2023-10-27 LAB — SPECIMEN STATUS REPORT

## 2023-10-27 NOTE — Telephone Encounter (Signed)
 Copied from CRM [id number]. Topic: Clinical - Home Health Verbal Orders >> Oct 27, 2023  9:59 AM Silvana PARAS wrote: Caller/AgencyBETHA Lenny FREDERIK Dorthea Mitch Number: [phone] Service Requested: Occupational Therapy, Physical Therapy, Speech Therapy, and Skilled Nursing Will begin when on 7/13

## 2023-10-27 NOTE — Telephone Encounter (Signed)
 Cindy with HH was notified

## 2023-10-28 DIAGNOSIS — Z87891 Personal history of nicotine dependence: Secondary | ICD-10-CM | POA: Diagnosis not present

## 2023-10-28 DIAGNOSIS — N390 Urinary tract infection, site not specified: Secondary | ICD-10-CM | POA: Diagnosis not present

## 2023-10-28 DIAGNOSIS — R32 Unspecified urinary incontinence: Secondary | ICD-10-CM | POA: Diagnosis not present

## 2023-10-28 DIAGNOSIS — Z860101 Personal history of adenomatous and serrated colon polyps: Secondary | ICD-10-CM | POA: Diagnosis not present

## 2023-10-28 DIAGNOSIS — J449 Chronic obstructive pulmonary disease, unspecified: Secondary | ICD-10-CM | POA: Diagnosis not present

## 2023-10-28 DIAGNOSIS — E039 Hypothyroidism, unspecified: Secondary | ICD-10-CM | POA: Diagnosis not present

## 2023-10-28 DIAGNOSIS — N1831 Chronic kidney disease, stage 3a: Secondary | ICD-10-CM | POA: Diagnosis not present

## 2023-10-28 DIAGNOSIS — E1122 Type 2 diabetes mellitus with diabetic chronic kidney disease: Secondary | ICD-10-CM | POA: Diagnosis not present

## 2023-10-28 DIAGNOSIS — M4726 Other spondylosis with radiculopathy, lumbar region: Secondary | ICD-10-CM | POA: Diagnosis not present

## 2023-10-28 DIAGNOSIS — Z90710 Acquired absence of both cervix and uterus: Secondary | ICD-10-CM | POA: Diagnosis not present

## 2023-10-28 DIAGNOSIS — I5032 Chronic diastolic (congestive) heart failure: Secondary | ICD-10-CM | POA: Diagnosis not present

## 2023-10-28 DIAGNOSIS — K219 Gastro-esophageal reflux disease without esophagitis: Secondary | ICD-10-CM | POA: Diagnosis not present

## 2023-10-28 DIAGNOSIS — R4181 Age-related cognitive decline: Secondary | ICD-10-CM | POA: Diagnosis not present

## 2023-10-28 DIAGNOSIS — F411 Generalized anxiety disorder: Secondary | ICD-10-CM | POA: Diagnosis not present

## 2023-10-28 DIAGNOSIS — Z923 Personal history of irradiation: Secondary | ICD-10-CM | POA: Diagnosis not present

## 2023-10-28 DIAGNOSIS — F32A Depression, unspecified: Secondary | ICD-10-CM | POA: Diagnosis not present

## 2023-10-28 DIAGNOSIS — E1140 Type 2 diabetes mellitus with diabetic neuropathy, unspecified: Secondary | ICD-10-CM | POA: Diagnosis not present

## 2023-10-28 DIAGNOSIS — Z8619 Personal history of other infectious and parasitic diseases: Secondary | ICD-10-CM | POA: Diagnosis not present

## 2023-10-28 DIAGNOSIS — I130 Hypertensive heart and chronic kidney disease with heart failure and stage 1 through stage 4 chronic kidney disease, or unspecified chronic kidney disease: Secondary | ICD-10-CM | POA: Diagnosis not present

## 2023-10-28 DIAGNOSIS — E782 Mixed hyperlipidemia: Secondary | ICD-10-CM | POA: Diagnosis not present

## 2023-10-28 DIAGNOSIS — I951 Orthostatic hypotension: Secondary | ICD-10-CM | POA: Diagnosis not present

## 2023-10-28 DIAGNOSIS — M170 Bilateral primary osteoarthritis of knee: Secondary | ICD-10-CM | POA: Diagnosis not present

## 2023-10-28 DIAGNOSIS — Z85828 Personal history of other malignant neoplasm of skin: Secondary | ICD-10-CM | POA: Diagnosis not present

## 2023-10-28 DIAGNOSIS — K5909 Other constipation: Secondary | ICD-10-CM | POA: Diagnosis not present

## 2023-10-30 ENCOUNTER — Telehealth: Payer: Self-pay

## 2023-10-30 NOTE — Telephone Encounter (Signed)
 Copied from CRM [id number]. Topic: Clinical - Home Health Verbal Orders >> Oct 30, 2023 11:31 AM Graeme ORN wrote: Caller/Agency: Sharlet Cleaves [facility] Callback Number: [phone] secure vm  Service Requested: Skilled Nursing Frequency: 1 week 4, 1 month 1, 2 PRN - for hypertension and chronic pain  Any new concerns about the patient? No

## 2023-10-30 NOTE — Telephone Encounter (Signed)
 Verbal order given

## 2023-11-01 ENCOUNTER — Other Ambulatory Visit (INDEPENDENT_AMBULATORY_CARE_PROVIDER_SITE_OTHER)

## 2023-11-01 ENCOUNTER — Ambulatory Visit: Payer: Self-pay | Admitting: Gastroenterology

## 2023-11-01 DIAGNOSIS — R7989 Other specified abnormal findings of blood chemistry: Secondary | ICD-10-CM

## 2023-11-01 DIAGNOSIS — R945 Abnormal results of liver function studies: Secondary | ICD-10-CM | POA: Diagnosis not present

## 2023-11-01 LAB — HEPATIC FUNCTION PANEL
ALT: 551 U/L — ABNORMAL HIGH (ref 0–35)
AST: 374 U/L — ABNORMAL HIGH (ref 0–37)
Albumin: 3.8 g/dL (ref 3.5–5.2)
Alkaline Phosphatase: 90 U/L (ref 39–117)
Bilirubin, Direct: 0.7 mg/dL — ABNORMAL HIGH (ref 0.0–0.3)
Total Bilirubin: 1.4 mg/dL — ABNORMAL HIGH (ref 0.2–1.2)
Total Protein: 6 g/dL (ref 6.0–8.3)

## 2023-11-01 LAB — PROTIME-INR
INR: 1.1 ratio — ABNORMAL HIGH (ref 0.8–1.0)
Prothrombin Time: 11.3 s (ref 9.6–13.1)

## 2023-11-02 ENCOUNTER — Other Ambulatory Visit: Payer: Self-pay | Admitting: Medical

## 2023-11-02 ENCOUNTER — Telehealth: Payer: Self-pay | Admitting: Internal Medicine

## 2023-11-02 DIAGNOSIS — F411 Generalized anxiety disorder: Secondary | ICD-10-CM | POA: Diagnosis not present

## 2023-11-02 DIAGNOSIS — K219 Gastro-esophageal reflux disease without esophagitis: Secondary | ICD-10-CM | POA: Diagnosis not present

## 2023-11-02 DIAGNOSIS — E1122 Type 2 diabetes mellitus with diabetic chronic kidney disease: Secondary | ICD-10-CM | POA: Diagnosis not present

## 2023-11-02 DIAGNOSIS — E782 Mixed hyperlipidemia: Secondary | ICD-10-CM | POA: Diagnosis not present

## 2023-11-02 DIAGNOSIS — R4181 Age-related cognitive decline: Secondary | ICD-10-CM | POA: Diagnosis not present

## 2023-11-02 DIAGNOSIS — F32A Depression, unspecified: Secondary | ICD-10-CM | POA: Diagnosis not present

## 2023-11-02 DIAGNOSIS — N1831 Chronic kidney disease, stage 3a: Secondary | ICD-10-CM | POA: Diagnosis not present

## 2023-11-02 DIAGNOSIS — Z923 Personal history of irradiation: Secondary | ICD-10-CM | POA: Diagnosis not present

## 2023-11-02 DIAGNOSIS — I130 Hypertensive heart and chronic kidney disease with heart failure and stage 1 through stage 4 chronic kidney disease, or unspecified chronic kidney disease: Secondary | ICD-10-CM | POA: Diagnosis not present

## 2023-11-02 DIAGNOSIS — Z8619 Personal history of other infectious and parasitic diseases: Secondary | ICD-10-CM | POA: Diagnosis not present

## 2023-11-02 DIAGNOSIS — E039 Hypothyroidism, unspecified: Secondary | ICD-10-CM | POA: Diagnosis not present

## 2023-11-02 DIAGNOSIS — N390 Urinary tract infection, site not specified: Secondary | ICD-10-CM | POA: Diagnosis not present

## 2023-11-02 DIAGNOSIS — R32 Unspecified urinary incontinence: Secondary | ICD-10-CM | POA: Diagnosis not present

## 2023-11-02 DIAGNOSIS — K5909 Other constipation: Secondary | ICD-10-CM | POA: Diagnosis not present

## 2023-11-02 DIAGNOSIS — E1140 Type 2 diabetes mellitus with diabetic neuropathy, unspecified: Secondary | ICD-10-CM | POA: Diagnosis not present

## 2023-11-02 DIAGNOSIS — Z87891 Personal history of nicotine dependence: Secondary | ICD-10-CM | POA: Diagnosis not present

## 2023-11-02 DIAGNOSIS — Z860101 Personal history of adenomatous and serrated colon polyps: Secondary | ICD-10-CM | POA: Diagnosis not present

## 2023-11-02 DIAGNOSIS — Z85828 Personal history of other malignant neoplasm of skin: Secondary | ICD-10-CM | POA: Diagnosis not present

## 2023-11-02 DIAGNOSIS — I5032 Chronic diastolic (congestive) heart failure: Secondary | ICD-10-CM | POA: Diagnosis not present

## 2023-11-02 DIAGNOSIS — M170 Bilateral primary osteoarthritis of knee: Secondary | ICD-10-CM | POA: Diagnosis not present

## 2023-11-02 DIAGNOSIS — M4726 Other spondylosis with radiculopathy, lumbar region: Secondary | ICD-10-CM | POA: Diagnosis not present

## 2023-11-02 DIAGNOSIS — J449 Chronic obstructive pulmonary disease, unspecified: Secondary | ICD-10-CM | POA: Diagnosis not present

## 2023-11-02 DIAGNOSIS — I951 Orthostatic hypotension: Secondary | ICD-10-CM | POA: Diagnosis not present

## 2023-11-02 DIAGNOSIS — Z90710 Acquired absence of both cervix and uterus: Secondary | ICD-10-CM | POA: Diagnosis not present

## 2023-11-02 LAB — ACUTE VIRAL HEPATITIS (HAV, HBV, HCV)
HCV Ab: NONREACTIVE
Hep A IgM: NEGATIVE
Hep B C IgM: NEGATIVE
Hepatitis B Surface Ag: NEGATIVE

## 2023-11-02 LAB — HCV INTERPRETATION

## 2023-11-02 MED ORDER — MIDODRINE HCL 5 MG PO TABS: 5.0000 mg | ORAL_TABLET | Freq: Two times a day (BID) | ORAL | 0 refills | Status: DC

## 2023-11-02 NOTE — Telephone Encounter (Signed)
 Pt was notified of results

## 2023-11-02 NOTE — Telephone Encounter (Signed)
 Left message for patient to call back as soon as possible.  LFT orders entered in EPIC to be completed 7/21 or 7/22.  Liver ultrasound/doppler has been scheduled at [facility] location for Monday, 11/06/23 at 8 am, 745 am arrival. NPO midnight.

## 2023-11-02 NOTE — Telephone Encounter (Signed)
 Copied from CRM [id number]. Topic: Clinical - Medical Advice >> Nov 02, 2023 11:23 AM Chasity T wrote: Reason for CRM: Patient is calling in for low blood pressure of 88/50 and is wanting someone to contact her to get a medication sent over to help her.

## 2023-11-03 DIAGNOSIS — N390 Urinary tract infection, site not specified: Secondary | ICD-10-CM | POA: Diagnosis not present

## 2023-11-03 DIAGNOSIS — Z8619 Personal history of other infectious and parasitic diseases: Secondary | ICD-10-CM | POA: Diagnosis not present

## 2023-11-03 DIAGNOSIS — E1122 Type 2 diabetes mellitus with diabetic chronic kidney disease: Secondary | ICD-10-CM | POA: Diagnosis not present

## 2023-11-03 DIAGNOSIS — I951 Orthostatic hypotension: Secondary | ICD-10-CM | POA: Diagnosis not present

## 2023-11-03 DIAGNOSIS — Z87891 Personal history of nicotine dependence: Secondary | ICD-10-CM | POA: Diagnosis not present

## 2023-11-03 DIAGNOSIS — N1831 Chronic kidney disease, stage 3a: Secondary | ICD-10-CM | POA: Diagnosis not present

## 2023-11-03 DIAGNOSIS — E039 Hypothyroidism, unspecified: Secondary | ICD-10-CM | POA: Diagnosis not present

## 2023-11-03 DIAGNOSIS — K5909 Other constipation: Secondary | ICD-10-CM | POA: Diagnosis not present

## 2023-11-03 DIAGNOSIS — M4726 Other spondylosis with radiculopathy, lumbar region: Secondary | ICD-10-CM | POA: Diagnosis not present

## 2023-11-03 DIAGNOSIS — Z923 Personal history of irradiation: Secondary | ICD-10-CM | POA: Diagnosis not present

## 2023-11-03 DIAGNOSIS — K219 Gastro-esophageal reflux disease without esophagitis: Secondary | ICD-10-CM | POA: Diagnosis not present

## 2023-11-03 DIAGNOSIS — F32A Depression, unspecified: Secondary | ICD-10-CM | POA: Diagnosis not present

## 2023-11-03 DIAGNOSIS — E782 Mixed hyperlipidemia: Secondary | ICD-10-CM | POA: Diagnosis not present

## 2023-11-03 DIAGNOSIS — R4181 Age-related cognitive decline: Secondary | ICD-10-CM | POA: Diagnosis not present

## 2023-11-03 DIAGNOSIS — E1140 Type 2 diabetes mellitus with diabetic neuropathy, unspecified: Secondary | ICD-10-CM | POA: Diagnosis not present

## 2023-11-03 DIAGNOSIS — Z860101 Personal history of adenomatous and serrated colon polyps: Secondary | ICD-10-CM | POA: Diagnosis not present

## 2023-11-03 DIAGNOSIS — I130 Hypertensive heart and chronic kidney disease with heart failure and stage 1 through stage 4 chronic kidney disease, or unspecified chronic kidney disease: Secondary | ICD-10-CM | POA: Diagnosis not present

## 2023-11-03 DIAGNOSIS — F411 Generalized anxiety disorder: Secondary | ICD-10-CM | POA: Diagnosis not present

## 2023-11-03 DIAGNOSIS — M170 Bilateral primary osteoarthritis of knee: Secondary | ICD-10-CM | POA: Diagnosis not present

## 2023-11-03 DIAGNOSIS — Z85828 Personal history of other malignant neoplasm of skin: Secondary | ICD-10-CM | POA: Diagnosis not present

## 2023-11-03 DIAGNOSIS — J449 Chronic obstructive pulmonary disease, unspecified: Secondary | ICD-10-CM | POA: Diagnosis not present

## 2023-11-03 DIAGNOSIS — I5032 Chronic diastolic (congestive) heart failure: Secondary | ICD-10-CM | POA: Diagnosis not present

## 2023-11-03 DIAGNOSIS — R32 Unspecified urinary incontinence: Secondary | ICD-10-CM | POA: Diagnosis not present

## 2023-11-03 DIAGNOSIS — Z90710 Acquired absence of both cervix and uterus: Secondary | ICD-10-CM | POA: Diagnosis not present

## 2023-11-04 LAB — ANA: Anti Nuclear Antibody (ANA): NEGATIVE

## 2023-11-04 LAB — IGG: IgG (Immunoglobin G), Serum: 735 mg/dL (ref 600–1540)

## 2023-11-04 LAB — ANTI-SMOOTH MUSCLE ANTIBODY, IGG: Actin (Smooth Muscle) Antibody (IGG): <20 U (ref <20)

## 2023-11-06 ENCOUNTER — Ambulatory Visit (HOSPITAL_BASED_OUTPATIENT_CLINIC_OR_DEPARTMENT_OTHER): Admission: RE | Admit: 2023-11-06 | Source: Ambulatory Visit

## 2023-11-06 ENCOUNTER — Telehealth: Payer: Self-pay | Admitting: Internal Medicine

## 2023-11-06 NOTE — Telephone Encounter (Signed)
 Copied from CRM [id number]. Topic: Clinical - Home Health Verbal Orders >> Nov 06, 2023  9:54 AM Aleatha BROCKS wrote: Caller/Agency: [facility] Callback Number: [phone] Service Requested: Occupational therapy and a Home health ais Frequency: 1 week 5x  for OT and  1 week for 4 home health aid Any new concerns about the patient? No

## 2023-11-06 NOTE — Telephone Encounter (Signed)
 Patient daughter f/u on previous note. Please advise at [phone]

## 2023-11-06 NOTE — Telephone Encounter (Signed)
 Signe was notified and gave verbal orders

## 2023-11-08 DIAGNOSIS — N1831 Chronic kidney disease, stage 3a: Secondary | ICD-10-CM | POA: Diagnosis not present

## 2023-11-08 DIAGNOSIS — I130 Hypertensive heart and chronic kidney disease with heart failure and stage 1 through stage 4 chronic kidney disease, or unspecified chronic kidney disease: Secondary | ICD-10-CM | POA: Diagnosis not present

## 2023-11-08 DIAGNOSIS — I5032 Chronic diastolic (congestive) heart failure: Secondary | ICD-10-CM | POA: Diagnosis not present

## 2023-11-08 DIAGNOSIS — E1122 Type 2 diabetes mellitus with diabetic chronic kidney disease: Secondary | ICD-10-CM | POA: Diagnosis not present

## 2023-11-08 DIAGNOSIS — K5909 Other constipation: Secondary | ICD-10-CM | POA: Diagnosis not present

## 2023-11-08 DIAGNOSIS — Z85828 Personal history of other malignant neoplasm of skin: Secondary | ICD-10-CM | POA: Diagnosis not present

## 2023-11-08 DIAGNOSIS — J449 Chronic obstructive pulmonary disease, unspecified: Secondary | ICD-10-CM | POA: Diagnosis not present

## 2023-11-08 DIAGNOSIS — E1140 Type 2 diabetes mellitus with diabetic neuropathy, unspecified: Secondary | ICD-10-CM | POA: Diagnosis not present

## 2023-11-08 DIAGNOSIS — E039 Hypothyroidism, unspecified: Secondary | ICD-10-CM | POA: Diagnosis not present

## 2023-11-08 DIAGNOSIS — R32 Unspecified urinary incontinence: Secondary | ICD-10-CM | POA: Diagnosis not present

## 2023-11-08 DIAGNOSIS — Z90710 Acquired absence of both cervix and uterus: Secondary | ICD-10-CM | POA: Diagnosis not present

## 2023-11-08 DIAGNOSIS — Z923 Personal history of irradiation: Secondary | ICD-10-CM | POA: Diagnosis not present

## 2023-11-08 DIAGNOSIS — F411 Generalized anxiety disorder: Secondary | ICD-10-CM | POA: Diagnosis not present

## 2023-11-08 DIAGNOSIS — M4726 Other spondylosis with radiculopathy, lumbar region: Secondary | ICD-10-CM | POA: Diagnosis not present

## 2023-11-08 DIAGNOSIS — Z8619 Personal history of other infectious and parasitic diseases: Secondary | ICD-10-CM | POA: Diagnosis not present

## 2023-11-08 DIAGNOSIS — I951 Orthostatic hypotension: Secondary | ICD-10-CM | POA: Diagnosis not present

## 2023-11-08 DIAGNOSIS — Z87891 Personal history of nicotine dependence: Secondary | ICD-10-CM | POA: Diagnosis not present

## 2023-11-08 DIAGNOSIS — N390 Urinary tract infection, site not specified: Secondary | ICD-10-CM | POA: Diagnosis not present

## 2023-11-08 DIAGNOSIS — F32A Depression, unspecified: Secondary | ICD-10-CM | POA: Diagnosis not present

## 2023-11-08 DIAGNOSIS — R4181 Age-related cognitive decline: Secondary | ICD-10-CM | POA: Diagnosis not present

## 2023-11-08 DIAGNOSIS — E782 Mixed hyperlipidemia: Secondary | ICD-10-CM | POA: Diagnosis not present

## 2023-11-08 DIAGNOSIS — K219 Gastro-esophageal reflux disease without esophagitis: Secondary | ICD-10-CM | POA: Diagnosis not present

## 2023-11-08 DIAGNOSIS — Z860101 Personal history of adenomatous and serrated colon polyps: Secondary | ICD-10-CM | POA: Diagnosis not present

## 2023-11-08 DIAGNOSIS — M170 Bilateral primary osteoarthritis of knee: Secondary | ICD-10-CM | POA: Diagnosis not present

## 2023-11-09 DIAGNOSIS — E1140 Type 2 diabetes mellitus with diabetic neuropathy, unspecified: Secondary | ICD-10-CM | POA: Diagnosis not present

## 2023-11-09 DIAGNOSIS — N390 Urinary tract infection, site not specified: Secondary | ICD-10-CM | POA: Diagnosis not present

## 2023-11-09 DIAGNOSIS — I5032 Chronic diastolic (congestive) heart failure: Secondary | ICD-10-CM | POA: Diagnosis not present

## 2023-11-09 DIAGNOSIS — R32 Unspecified urinary incontinence: Secondary | ICD-10-CM | POA: Diagnosis not present

## 2023-11-09 DIAGNOSIS — N1831 Chronic kidney disease, stage 3a: Secondary | ICD-10-CM | POA: Diagnosis not present

## 2023-11-09 DIAGNOSIS — Z860101 Personal history of adenomatous and serrated colon polyps: Secondary | ICD-10-CM | POA: Diagnosis not present

## 2023-11-09 DIAGNOSIS — Z87891 Personal history of nicotine dependence: Secondary | ICD-10-CM | POA: Diagnosis not present

## 2023-11-09 DIAGNOSIS — Z8619 Personal history of other infectious and parasitic diseases: Secondary | ICD-10-CM | POA: Diagnosis not present

## 2023-11-09 DIAGNOSIS — R4181 Age-related cognitive decline: Secondary | ICD-10-CM | POA: Diagnosis not present

## 2023-11-09 DIAGNOSIS — F411 Generalized anxiety disorder: Secondary | ICD-10-CM | POA: Diagnosis not present

## 2023-11-09 DIAGNOSIS — I951 Orthostatic hypotension: Secondary | ICD-10-CM | POA: Diagnosis not present

## 2023-11-09 DIAGNOSIS — Z923 Personal history of irradiation: Secondary | ICD-10-CM | POA: Diagnosis not present

## 2023-11-09 DIAGNOSIS — E1122 Type 2 diabetes mellitus with diabetic chronic kidney disease: Secondary | ICD-10-CM | POA: Diagnosis not present

## 2023-11-09 DIAGNOSIS — E039 Hypothyroidism, unspecified: Secondary | ICD-10-CM | POA: Diagnosis not present

## 2023-11-09 DIAGNOSIS — M170 Bilateral primary osteoarthritis of knee: Secondary | ICD-10-CM | POA: Diagnosis not present

## 2023-11-09 DIAGNOSIS — E782 Mixed hyperlipidemia: Secondary | ICD-10-CM | POA: Diagnosis not present

## 2023-11-09 DIAGNOSIS — Z90710 Acquired absence of both cervix and uterus: Secondary | ICD-10-CM | POA: Diagnosis not present

## 2023-11-09 DIAGNOSIS — J449 Chronic obstructive pulmonary disease, unspecified: Secondary | ICD-10-CM | POA: Diagnosis not present

## 2023-11-09 DIAGNOSIS — I130 Hypertensive heart and chronic kidney disease with heart failure and stage 1 through stage 4 chronic kidney disease, or unspecified chronic kidney disease: Secondary | ICD-10-CM | POA: Diagnosis not present

## 2023-11-09 DIAGNOSIS — Z85828 Personal history of other malignant neoplasm of skin: Secondary | ICD-10-CM | POA: Diagnosis not present

## 2023-11-09 DIAGNOSIS — M4726 Other spondylosis with radiculopathy, lumbar region: Secondary | ICD-10-CM | POA: Diagnosis not present

## 2023-11-09 DIAGNOSIS — F32A Depression, unspecified: Secondary | ICD-10-CM | POA: Diagnosis not present

## 2023-11-09 DIAGNOSIS — K219 Gastro-esophageal reflux disease without esophagitis: Secondary | ICD-10-CM | POA: Diagnosis not present

## 2023-11-09 DIAGNOSIS — K5909 Other constipation: Secondary | ICD-10-CM | POA: Diagnosis not present

## 2023-11-14 ENCOUNTER — Ambulatory Visit (HOSPITAL_COMMUNITY)

## 2023-11-14 ENCOUNTER — Telehealth: Payer: Self-pay

## 2023-11-14 DIAGNOSIS — Z8619 Personal history of other infectious and parasitic diseases: Secondary | ICD-10-CM | POA: Diagnosis not present

## 2023-11-14 DIAGNOSIS — E1140 Type 2 diabetes mellitus with diabetic neuropathy, unspecified: Secondary | ICD-10-CM | POA: Diagnosis not present

## 2023-11-14 DIAGNOSIS — M4726 Other spondylosis with radiculopathy, lumbar region: Secondary | ICD-10-CM | POA: Diagnosis not present

## 2023-11-14 DIAGNOSIS — F32A Depression, unspecified: Secondary | ICD-10-CM | POA: Diagnosis not present

## 2023-11-14 DIAGNOSIS — R32 Unspecified urinary incontinence: Secondary | ICD-10-CM | POA: Diagnosis not present

## 2023-11-14 DIAGNOSIS — Z87891 Personal history of nicotine dependence: Secondary | ICD-10-CM | POA: Diagnosis not present

## 2023-11-14 DIAGNOSIS — Z860101 Personal history of adenomatous and serrated colon polyps: Secondary | ICD-10-CM | POA: Diagnosis not present

## 2023-11-14 DIAGNOSIS — E1122 Type 2 diabetes mellitus with diabetic chronic kidney disease: Secondary | ICD-10-CM | POA: Diagnosis not present

## 2023-11-14 DIAGNOSIS — F411 Generalized anxiety disorder: Secondary | ICD-10-CM | POA: Diagnosis not present

## 2023-11-14 DIAGNOSIS — I5032 Chronic diastolic (congestive) heart failure: Secondary | ICD-10-CM | POA: Diagnosis not present

## 2023-11-14 DIAGNOSIS — Z923 Personal history of irradiation: Secondary | ICD-10-CM | POA: Diagnosis not present

## 2023-11-14 DIAGNOSIS — E039 Hypothyroidism, unspecified: Secondary | ICD-10-CM | POA: Diagnosis not present

## 2023-11-14 DIAGNOSIS — J449 Chronic obstructive pulmonary disease, unspecified: Secondary | ICD-10-CM | POA: Diagnosis not present

## 2023-11-14 DIAGNOSIS — R4181 Age-related cognitive decline: Secondary | ICD-10-CM | POA: Diagnosis not present

## 2023-11-14 DIAGNOSIS — M170 Bilateral primary osteoarthritis of knee: Secondary | ICD-10-CM | POA: Diagnosis not present

## 2023-11-14 DIAGNOSIS — I130 Hypertensive heart and chronic kidney disease with heart failure and stage 1 through stage 4 chronic kidney disease, or unspecified chronic kidney disease: Secondary | ICD-10-CM | POA: Diagnosis not present

## 2023-11-14 DIAGNOSIS — K219 Gastro-esophageal reflux disease without esophagitis: Secondary | ICD-10-CM | POA: Diagnosis not present

## 2023-11-14 DIAGNOSIS — Z90710 Acquired absence of both cervix and uterus: Secondary | ICD-10-CM | POA: Diagnosis not present

## 2023-11-14 DIAGNOSIS — Z85828 Personal history of other malignant neoplasm of skin: Secondary | ICD-10-CM | POA: Diagnosis not present

## 2023-11-14 DIAGNOSIS — N1831 Chronic kidney disease, stage 3a: Secondary | ICD-10-CM | POA: Diagnosis not present

## 2023-11-14 DIAGNOSIS — I951 Orthostatic hypotension: Secondary | ICD-10-CM | POA: Diagnosis not present

## 2023-11-14 DIAGNOSIS — E782 Mixed hyperlipidemia: Secondary | ICD-10-CM | POA: Diagnosis not present

## 2023-11-14 DIAGNOSIS — N390 Urinary tract infection, site not specified: Secondary | ICD-10-CM | POA: Diagnosis not present

## 2023-11-14 DIAGNOSIS — K5909 Other constipation: Secondary | ICD-10-CM | POA: Diagnosis not present

## 2023-11-14 NOTE — Telephone Encounter (Signed)
 Received request letter from [employer] [facility] requesting a refill to be fax to [phone]. Fill and fax it to provider office to be sign and fax it to [facility] [facility].

## 2023-11-15 ENCOUNTER — Ambulatory Visit (HOSPITAL_COMMUNITY)
Admission: RE | Admit: 2023-11-15 | Discharge: 2023-11-15 | Disposition: A | Discharge status: Home / Self Care | Source: Ambulatory Visit | Attending: Gastroenterology | Admitting: Gastroenterology

## 2023-11-15 DIAGNOSIS — N281 Cyst of kidney, acquired: Secondary | ICD-10-CM | POA: Diagnosis not present

## 2023-11-15 DIAGNOSIS — R7989 Other specified abnormal findings of blood chemistry: Secondary | ICD-10-CM | POA: Diagnosis not present

## 2023-11-16 DIAGNOSIS — I130 Hypertensive heart and chronic kidney disease with heart failure and stage 1 through stage 4 chronic kidney disease, or unspecified chronic kidney disease: Secondary | ICD-10-CM | POA: Diagnosis not present

## 2023-11-16 DIAGNOSIS — J449 Chronic obstructive pulmonary disease, unspecified: Secondary | ICD-10-CM | POA: Diagnosis not present

## 2023-11-16 DIAGNOSIS — E1122 Type 2 diabetes mellitus with diabetic chronic kidney disease: Secondary | ICD-10-CM | POA: Diagnosis not present

## 2023-11-16 DIAGNOSIS — N390 Urinary tract infection, site not specified: Secondary | ICD-10-CM | POA: Diagnosis not present

## 2023-11-16 DIAGNOSIS — Z923 Personal history of irradiation: Secondary | ICD-10-CM | POA: Diagnosis not present

## 2023-11-16 DIAGNOSIS — N1831 Chronic kidney disease, stage 3a: Secondary | ICD-10-CM | POA: Diagnosis not present

## 2023-11-16 DIAGNOSIS — R32 Unspecified urinary incontinence: Secondary | ICD-10-CM | POA: Diagnosis not present

## 2023-11-16 DIAGNOSIS — Z860101 Personal history of adenomatous and serrated colon polyps: Secondary | ICD-10-CM | POA: Diagnosis not present

## 2023-11-16 DIAGNOSIS — Z8619 Personal history of other infectious and parasitic diseases: Secondary | ICD-10-CM | POA: Diagnosis not present

## 2023-11-16 DIAGNOSIS — R4181 Age-related cognitive decline: Secondary | ICD-10-CM | POA: Diagnosis not present

## 2023-11-16 DIAGNOSIS — K219 Gastro-esophageal reflux disease without esophagitis: Secondary | ICD-10-CM | POA: Diagnosis not present

## 2023-11-16 DIAGNOSIS — M170 Bilateral primary osteoarthritis of knee: Secondary | ICD-10-CM | POA: Diagnosis not present

## 2023-11-16 DIAGNOSIS — K5909 Other constipation: Secondary | ICD-10-CM | POA: Diagnosis not present

## 2023-11-16 DIAGNOSIS — E782 Mixed hyperlipidemia: Secondary | ICD-10-CM | POA: Diagnosis not present

## 2023-11-16 DIAGNOSIS — I951 Orthostatic hypotension: Secondary | ICD-10-CM | POA: Diagnosis not present

## 2023-11-16 DIAGNOSIS — E1140 Type 2 diabetes mellitus with diabetic neuropathy, unspecified: Secondary | ICD-10-CM | POA: Diagnosis not present

## 2023-11-16 DIAGNOSIS — Z85828 Personal history of other malignant neoplasm of skin: Secondary | ICD-10-CM | POA: Diagnosis not present

## 2023-11-16 DIAGNOSIS — Z87891 Personal history of nicotine dependence: Secondary | ICD-10-CM | POA: Diagnosis not present

## 2023-11-16 DIAGNOSIS — F32A Depression, unspecified: Secondary | ICD-10-CM | POA: Diagnosis not present

## 2023-11-16 DIAGNOSIS — F411 Generalized anxiety disorder: Secondary | ICD-10-CM | POA: Diagnosis not present

## 2023-11-16 DIAGNOSIS — I5032 Chronic diastolic (congestive) heart failure: Secondary | ICD-10-CM | POA: Diagnosis not present

## 2023-11-16 DIAGNOSIS — M4726 Other spondylosis with radiculopathy, lumbar region: Secondary | ICD-10-CM | POA: Diagnosis not present

## 2023-11-16 DIAGNOSIS — Z90710 Acquired absence of both cervix and uterus: Secondary | ICD-10-CM | POA: Diagnosis not present

## 2023-11-16 DIAGNOSIS — E039 Hypothyroidism, unspecified: Secondary | ICD-10-CM | POA: Diagnosis not present

## 2023-11-17 ENCOUNTER — Encounter: Payer: Self-pay | Admitting: Internal Medicine

## 2023-11-17 DIAGNOSIS — M4726 Other spondylosis with radiculopathy, lumbar region: Secondary | ICD-10-CM | POA: Diagnosis not present

## 2023-11-17 DIAGNOSIS — I130 Hypertensive heart and chronic kidney disease with heart failure and stage 1 through stage 4 chronic kidney disease, or unspecified chronic kidney disease: Secondary | ICD-10-CM | POA: Diagnosis not present

## 2023-11-17 DIAGNOSIS — Z87891 Personal history of nicotine dependence: Secondary | ICD-10-CM | POA: Diagnosis not present

## 2023-11-17 DIAGNOSIS — K5909 Other constipation: Secondary | ICD-10-CM | POA: Diagnosis not present

## 2023-11-17 DIAGNOSIS — E1122 Type 2 diabetes mellitus with diabetic chronic kidney disease: Secondary | ICD-10-CM | POA: Diagnosis not present

## 2023-11-17 DIAGNOSIS — F411 Generalized anxiety disorder: Secondary | ICD-10-CM | POA: Diagnosis not present

## 2023-11-17 DIAGNOSIS — I951 Orthostatic hypotension: Secondary | ICD-10-CM | POA: Diagnosis not present

## 2023-11-17 DIAGNOSIS — E1140 Type 2 diabetes mellitus with diabetic neuropathy, unspecified: Secondary | ICD-10-CM | POA: Diagnosis not present

## 2023-11-17 DIAGNOSIS — N390 Urinary tract infection, site not specified: Secondary | ICD-10-CM | POA: Diagnosis not present

## 2023-11-17 DIAGNOSIS — E039 Hypothyroidism, unspecified: Secondary | ICD-10-CM | POA: Diagnosis not present

## 2023-11-17 DIAGNOSIS — Z923 Personal history of irradiation: Secondary | ICD-10-CM | POA: Diagnosis not present

## 2023-11-17 DIAGNOSIS — E782 Mixed hyperlipidemia: Secondary | ICD-10-CM | POA: Diagnosis not present

## 2023-11-17 DIAGNOSIS — Z8619 Personal history of other infectious and parasitic diseases: Secondary | ICD-10-CM | POA: Diagnosis not present

## 2023-11-17 DIAGNOSIS — R32 Unspecified urinary incontinence: Secondary | ICD-10-CM | POA: Diagnosis not present

## 2023-11-17 DIAGNOSIS — F32A Depression, unspecified: Secondary | ICD-10-CM | POA: Diagnosis not present

## 2023-11-17 DIAGNOSIS — I5032 Chronic diastolic (congestive) heart failure: Secondary | ICD-10-CM | POA: Diagnosis not present

## 2023-11-17 DIAGNOSIS — Z90710 Acquired absence of both cervix and uterus: Secondary | ICD-10-CM | POA: Diagnosis not present

## 2023-11-17 DIAGNOSIS — R4181 Age-related cognitive decline: Secondary | ICD-10-CM | POA: Diagnosis not present

## 2023-11-17 DIAGNOSIS — N1831 Chronic kidney disease, stage 3a: Secondary | ICD-10-CM | POA: Diagnosis not present

## 2023-11-17 DIAGNOSIS — K219 Gastro-esophageal reflux disease without esophagitis: Secondary | ICD-10-CM | POA: Diagnosis not present

## 2023-11-17 DIAGNOSIS — M170 Bilateral primary osteoarthritis of knee: Secondary | ICD-10-CM | POA: Diagnosis not present

## 2023-11-17 DIAGNOSIS — Z860101 Personal history of adenomatous and serrated colon polyps: Secondary | ICD-10-CM | POA: Diagnosis not present

## 2023-11-17 DIAGNOSIS — Z85828 Personal history of other malignant neoplasm of skin: Secondary | ICD-10-CM | POA: Diagnosis not present

## 2023-11-17 DIAGNOSIS — J449 Chronic obstructive pulmonary disease, unspecified: Secondary | ICD-10-CM | POA: Diagnosis not present

## 2023-11-20 DIAGNOSIS — Z8619 Personal history of other infectious and parasitic diseases: Secondary | ICD-10-CM | POA: Diagnosis not present

## 2023-11-20 DIAGNOSIS — J449 Chronic obstructive pulmonary disease, unspecified: Secondary | ICD-10-CM | POA: Diagnosis not present

## 2023-11-20 DIAGNOSIS — R32 Unspecified urinary incontinence: Secondary | ICD-10-CM | POA: Diagnosis not present

## 2023-11-20 DIAGNOSIS — Z85828 Personal history of other malignant neoplasm of skin: Secondary | ICD-10-CM | POA: Diagnosis not present

## 2023-11-20 DIAGNOSIS — Z90710 Acquired absence of both cervix and uterus: Secondary | ICD-10-CM | POA: Diagnosis not present

## 2023-11-20 DIAGNOSIS — K219 Gastro-esophageal reflux disease without esophagitis: Secondary | ICD-10-CM | POA: Diagnosis not present

## 2023-11-20 DIAGNOSIS — E039 Hypothyroidism, unspecified: Secondary | ICD-10-CM | POA: Diagnosis not present

## 2023-11-20 DIAGNOSIS — M4726 Other spondylosis with radiculopathy, lumbar region: Secondary | ICD-10-CM | POA: Diagnosis not present

## 2023-11-20 DIAGNOSIS — N1831 Chronic kidney disease, stage 3a: Secondary | ICD-10-CM | POA: Diagnosis not present

## 2023-11-20 DIAGNOSIS — Z87891 Personal history of nicotine dependence: Secondary | ICD-10-CM | POA: Diagnosis not present

## 2023-11-20 DIAGNOSIS — Z860101 Personal history of adenomatous and serrated colon polyps: Secondary | ICD-10-CM | POA: Diagnosis not present

## 2023-11-20 DIAGNOSIS — N390 Urinary tract infection, site not specified: Secondary | ICD-10-CM | POA: Diagnosis not present

## 2023-11-20 DIAGNOSIS — I5032 Chronic diastolic (congestive) heart failure: Secondary | ICD-10-CM | POA: Diagnosis not present

## 2023-11-20 DIAGNOSIS — Z923 Personal history of irradiation: Secondary | ICD-10-CM | POA: Diagnosis not present

## 2023-11-20 DIAGNOSIS — E1140 Type 2 diabetes mellitus with diabetic neuropathy, unspecified: Secondary | ICD-10-CM | POA: Diagnosis not present

## 2023-11-20 DIAGNOSIS — E782 Mixed hyperlipidemia: Secondary | ICD-10-CM | POA: Diagnosis not present

## 2023-11-20 DIAGNOSIS — F32A Depression, unspecified: Secondary | ICD-10-CM | POA: Diagnosis not present

## 2023-11-20 DIAGNOSIS — I130 Hypertensive heart and chronic kidney disease with heart failure and stage 1 through stage 4 chronic kidney disease, or unspecified chronic kidney disease: Secondary | ICD-10-CM | POA: Diagnosis not present

## 2023-11-20 DIAGNOSIS — R4181 Age-related cognitive decline: Secondary | ICD-10-CM | POA: Diagnosis not present

## 2023-11-20 DIAGNOSIS — I951 Orthostatic hypotension: Secondary | ICD-10-CM | POA: Diagnosis not present

## 2023-11-20 DIAGNOSIS — E1122 Type 2 diabetes mellitus with diabetic chronic kidney disease: Secondary | ICD-10-CM | POA: Diagnosis not present

## 2023-11-20 DIAGNOSIS — K5909 Other constipation: Secondary | ICD-10-CM | POA: Diagnosis not present

## 2023-11-20 DIAGNOSIS — M170 Bilateral primary osteoarthritis of knee: Secondary | ICD-10-CM | POA: Diagnosis not present

## 2023-11-20 DIAGNOSIS — F411 Generalized anxiety disorder: Secondary | ICD-10-CM | POA: Diagnosis not present

## 2023-11-22 DIAGNOSIS — M4726 Other spondylosis with radiculopathy, lumbar region: Secondary | ICD-10-CM | POA: Diagnosis not present

## 2023-11-22 DIAGNOSIS — Z87891 Personal history of nicotine dependence: Secondary | ICD-10-CM | POA: Diagnosis not present

## 2023-11-22 DIAGNOSIS — Z923 Personal history of irradiation: Secondary | ICD-10-CM | POA: Diagnosis not present

## 2023-11-22 DIAGNOSIS — E039 Hypothyroidism, unspecified: Secondary | ICD-10-CM | POA: Diagnosis not present

## 2023-11-22 DIAGNOSIS — F32A Depression, unspecified: Secondary | ICD-10-CM | POA: Diagnosis not present

## 2023-11-22 DIAGNOSIS — J449 Chronic obstructive pulmonary disease, unspecified: Secondary | ICD-10-CM | POA: Diagnosis not present

## 2023-11-22 DIAGNOSIS — K219 Gastro-esophageal reflux disease without esophagitis: Secondary | ICD-10-CM | POA: Diagnosis not present

## 2023-11-22 DIAGNOSIS — K5909 Other constipation: Secondary | ICD-10-CM | POA: Diagnosis not present

## 2023-11-22 DIAGNOSIS — F411 Generalized anxiety disorder: Secondary | ICD-10-CM | POA: Diagnosis not present

## 2023-11-22 DIAGNOSIS — Z90710 Acquired absence of both cervix and uterus: Secondary | ICD-10-CM | POA: Diagnosis not present

## 2023-11-22 DIAGNOSIS — M170 Bilateral primary osteoarthritis of knee: Secondary | ICD-10-CM | POA: Diagnosis not present

## 2023-11-22 DIAGNOSIS — Z860101 Personal history of adenomatous and serrated colon polyps: Secondary | ICD-10-CM | POA: Diagnosis not present

## 2023-11-22 DIAGNOSIS — E782 Mixed hyperlipidemia: Secondary | ICD-10-CM | POA: Diagnosis not present

## 2023-11-22 DIAGNOSIS — N390 Urinary tract infection, site not specified: Secondary | ICD-10-CM | POA: Diagnosis not present

## 2023-11-22 DIAGNOSIS — I5032 Chronic diastolic (congestive) heart failure: Secondary | ICD-10-CM | POA: Diagnosis not present

## 2023-11-22 DIAGNOSIS — E1140 Type 2 diabetes mellitus with diabetic neuropathy, unspecified: Secondary | ICD-10-CM | POA: Diagnosis not present

## 2023-11-22 DIAGNOSIS — I130 Hypertensive heart and chronic kidney disease with heart failure and stage 1 through stage 4 chronic kidney disease, or unspecified chronic kidney disease: Secondary | ICD-10-CM | POA: Diagnosis not present

## 2023-11-22 DIAGNOSIS — I951 Orthostatic hypotension: Secondary | ICD-10-CM | POA: Diagnosis not present

## 2023-11-22 DIAGNOSIS — N1831 Chronic kidney disease, stage 3a: Secondary | ICD-10-CM | POA: Diagnosis not present

## 2023-11-22 DIAGNOSIS — R4181 Age-related cognitive decline: Secondary | ICD-10-CM | POA: Diagnosis not present

## 2023-11-22 DIAGNOSIS — Z85828 Personal history of other malignant neoplasm of skin: Secondary | ICD-10-CM | POA: Diagnosis not present

## 2023-11-22 DIAGNOSIS — E1122 Type 2 diabetes mellitus with diabetic chronic kidney disease: Secondary | ICD-10-CM | POA: Diagnosis not present

## 2023-11-22 DIAGNOSIS — R32 Unspecified urinary incontinence: Secondary | ICD-10-CM | POA: Diagnosis not present

## 2023-11-22 DIAGNOSIS — Z8619 Personal history of other infectious and parasitic diseases: Secondary | ICD-10-CM | POA: Diagnosis not present

## 2023-11-24 DIAGNOSIS — N390 Urinary tract infection, site not specified: Secondary | ICD-10-CM | POA: Diagnosis not present

## 2023-11-24 DIAGNOSIS — K219 Gastro-esophageal reflux disease without esophagitis: Secondary | ICD-10-CM | POA: Diagnosis not present

## 2023-11-24 DIAGNOSIS — Z85828 Personal history of other malignant neoplasm of skin: Secondary | ICD-10-CM | POA: Diagnosis not present

## 2023-11-24 DIAGNOSIS — K5909 Other constipation: Secondary | ICD-10-CM | POA: Diagnosis not present

## 2023-11-24 DIAGNOSIS — J449 Chronic obstructive pulmonary disease, unspecified: Secondary | ICD-10-CM | POA: Diagnosis not present

## 2023-11-24 DIAGNOSIS — E039 Hypothyroidism, unspecified: Secondary | ICD-10-CM | POA: Diagnosis not present

## 2023-11-24 DIAGNOSIS — Z923 Personal history of irradiation: Secondary | ICD-10-CM | POA: Diagnosis not present

## 2023-11-24 DIAGNOSIS — I5032 Chronic diastolic (congestive) heart failure: Secondary | ICD-10-CM | POA: Diagnosis not present

## 2023-11-24 DIAGNOSIS — E782 Mixed hyperlipidemia: Secondary | ICD-10-CM | POA: Diagnosis not present

## 2023-11-24 DIAGNOSIS — Z860101 Personal history of adenomatous and serrated colon polyps: Secondary | ICD-10-CM | POA: Diagnosis not present

## 2023-11-24 DIAGNOSIS — E1122 Type 2 diabetes mellitus with diabetic chronic kidney disease: Secondary | ICD-10-CM | POA: Diagnosis not present

## 2023-11-24 DIAGNOSIS — R4181 Age-related cognitive decline: Secondary | ICD-10-CM | POA: Diagnosis not present

## 2023-11-24 DIAGNOSIS — Z87891 Personal history of nicotine dependence: Secondary | ICD-10-CM | POA: Diagnosis not present

## 2023-11-24 DIAGNOSIS — F411 Generalized anxiety disorder: Secondary | ICD-10-CM | POA: Diagnosis not present

## 2023-11-24 DIAGNOSIS — N1831 Chronic kidney disease, stage 3a: Secondary | ICD-10-CM | POA: Diagnosis not present

## 2023-11-24 DIAGNOSIS — I951 Orthostatic hypotension: Secondary | ICD-10-CM | POA: Diagnosis not present

## 2023-11-24 DIAGNOSIS — E1140 Type 2 diabetes mellitus with diabetic neuropathy, unspecified: Secondary | ICD-10-CM | POA: Diagnosis not present

## 2023-11-24 DIAGNOSIS — M4726 Other spondylosis with radiculopathy, lumbar region: Secondary | ICD-10-CM | POA: Diagnosis not present

## 2023-11-24 DIAGNOSIS — F32A Depression, unspecified: Secondary | ICD-10-CM | POA: Diagnosis not present

## 2023-11-24 DIAGNOSIS — I130 Hypertensive heart and chronic kidney disease with heart failure and stage 1 through stage 4 chronic kidney disease, or unspecified chronic kidney disease: Secondary | ICD-10-CM | POA: Diagnosis not present

## 2023-11-24 DIAGNOSIS — R32 Unspecified urinary incontinence: Secondary | ICD-10-CM | POA: Diagnosis not present

## 2023-11-24 DIAGNOSIS — Z90710 Acquired absence of both cervix and uterus: Secondary | ICD-10-CM | POA: Diagnosis not present

## 2023-11-24 DIAGNOSIS — M170 Bilateral primary osteoarthritis of knee: Secondary | ICD-10-CM | POA: Diagnosis not present

## 2023-11-24 DIAGNOSIS — Z8619 Personal history of other infectious and parasitic diseases: Secondary | ICD-10-CM | POA: Diagnosis not present

## 2023-11-24 NOTE — Addendum Note (Signed)
 Addended by: CLAUDENE NAOMIE SAILOR on: 11/24/2023 01:55 PM   Modules accepted: Orders

## 2023-11-24 NOTE — Telephone Encounter (Signed)
 See 11/24/23 patient message response (within 11/01/23 result follow up message) for additional details.

## 2023-11-27 DIAGNOSIS — K5909 Other constipation: Secondary | ICD-10-CM | POA: Diagnosis not present

## 2023-11-27 DIAGNOSIS — M170 Bilateral primary osteoarthritis of knee: Secondary | ICD-10-CM | POA: Diagnosis not present

## 2023-11-27 DIAGNOSIS — Z90710 Acquired absence of both cervix and uterus: Secondary | ICD-10-CM | POA: Diagnosis not present

## 2023-11-27 DIAGNOSIS — I951 Orthostatic hypotension: Secondary | ICD-10-CM | POA: Diagnosis not present

## 2023-11-27 DIAGNOSIS — Z87891 Personal history of nicotine dependence: Secondary | ICD-10-CM | POA: Diagnosis not present

## 2023-11-27 DIAGNOSIS — I5032 Chronic diastolic (congestive) heart failure: Secondary | ICD-10-CM | POA: Diagnosis not present

## 2023-11-27 DIAGNOSIS — N1831 Chronic kidney disease, stage 3a: Secondary | ICD-10-CM | POA: Diagnosis not present

## 2023-11-27 DIAGNOSIS — E782 Mixed hyperlipidemia: Secondary | ICD-10-CM | POA: Diagnosis not present

## 2023-11-27 DIAGNOSIS — E1140 Type 2 diabetes mellitus with diabetic neuropathy, unspecified: Secondary | ICD-10-CM | POA: Diagnosis not present

## 2023-11-27 DIAGNOSIS — F32A Depression, unspecified: Secondary | ICD-10-CM | POA: Diagnosis not present

## 2023-11-27 DIAGNOSIS — I130 Hypertensive heart and chronic kidney disease with heart failure and stage 1 through stage 4 chronic kidney disease, or unspecified chronic kidney disease: Secondary | ICD-10-CM | POA: Diagnosis not present

## 2023-11-27 DIAGNOSIS — Z8619 Personal history of other infectious and parasitic diseases: Secondary | ICD-10-CM | POA: Diagnosis not present

## 2023-11-27 DIAGNOSIS — Z860101 Personal history of adenomatous and serrated colon polyps: Secondary | ICD-10-CM | POA: Diagnosis not present

## 2023-11-27 DIAGNOSIS — J449 Chronic obstructive pulmonary disease, unspecified: Secondary | ICD-10-CM | POA: Diagnosis not present

## 2023-11-27 DIAGNOSIS — E039 Hypothyroidism, unspecified: Secondary | ICD-10-CM | POA: Diagnosis not present

## 2023-11-27 DIAGNOSIS — R32 Unspecified urinary incontinence: Secondary | ICD-10-CM | POA: Diagnosis not present

## 2023-11-27 DIAGNOSIS — Z923 Personal history of irradiation: Secondary | ICD-10-CM | POA: Diagnosis not present

## 2023-11-27 DIAGNOSIS — F411 Generalized anxiety disorder: Secondary | ICD-10-CM | POA: Diagnosis not present

## 2023-11-27 DIAGNOSIS — Z85828 Personal history of other malignant neoplasm of skin: Secondary | ICD-10-CM | POA: Diagnosis not present

## 2023-11-27 DIAGNOSIS — R4181 Age-related cognitive decline: Secondary | ICD-10-CM | POA: Diagnosis not present

## 2023-11-27 DIAGNOSIS — N390 Urinary tract infection, site not specified: Secondary | ICD-10-CM | POA: Diagnosis not present

## 2023-11-27 DIAGNOSIS — M4726 Other spondylosis with radiculopathy, lumbar region: Secondary | ICD-10-CM | POA: Diagnosis not present

## 2023-11-27 DIAGNOSIS — K219 Gastro-esophageal reflux disease without esophagitis: Secondary | ICD-10-CM | POA: Diagnosis not present

## 2023-11-27 DIAGNOSIS — E1122 Type 2 diabetes mellitus with diabetic chronic kidney disease: Secondary | ICD-10-CM | POA: Diagnosis not present

## 2023-11-28 ENCOUNTER — Other Ambulatory Visit (INDEPENDENT_AMBULATORY_CARE_PROVIDER_SITE_OTHER)

## 2023-11-28 DIAGNOSIS — R7989 Other specified abnormal findings of blood chemistry: Secondary | ICD-10-CM

## 2023-11-28 LAB — PROTIME-INR
INR: 1.1 ratio — ABNORMAL HIGH (ref 0.8–1.0)
Prothrombin Time: 11.2 s (ref 9.6–13.1)

## 2023-11-28 LAB — HEPATIC FUNCTION PANEL
ALT: 204 U/L — ABNORMAL HIGH (ref 0–35)
AST: 170 U/L — ABNORMAL HIGH (ref 0–37)
Albumin: 3.4 g/dL — ABNORMAL LOW (ref 3.5–5.2)
Alkaline Phosphatase: 86 U/L (ref 39–117)
Bilirubin, Direct: 0.6 mg/dL — ABNORMAL HIGH (ref 0.0–0.3)
Total Bilirubin: 1.2 mg/dL (ref 0.2–1.2)
Total Protein: 5.6 g/dL — ABNORMAL LOW (ref 6.0–8.3)

## 2023-11-29 ENCOUNTER — Ambulatory Visit: Payer: Self-pay | Admitting: Gastroenterology

## 2023-11-29 DIAGNOSIS — R7989 Other specified abnormal findings of blood chemistry: Secondary | ICD-10-CM

## 2023-11-29 DIAGNOSIS — R4181 Age-related cognitive decline: Secondary | ICD-10-CM | POA: Diagnosis not present

## 2023-11-29 DIAGNOSIS — E039 Hypothyroidism, unspecified: Secondary | ICD-10-CM | POA: Diagnosis not present

## 2023-11-29 DIAGNOSIS — I130 Hypertensive heart and chronic kidney disease with heart failure and stage 1 through stage 4 chronic kidney disease, or unspecified chronic kidney disease: Secondary | ICD-10-CM | POA: Diagnosis not present

## 2023-11-29 DIAGNOSIS — Z8619 Personal history of other infectious and parasitic diseases: Secondary | ICD-10-CM | POA: Diagnosis not present

## 2023-11-29 DIAGNOSIS — Z87891 Personal history of nicotine dependence: Secondary | ICD-10-CM | POA: Diagnosis not present

## 2023-11-29 DIAGNOSIS — E1140 Type 2 diabetes mellitus with diabetic neuropathy, unspecified: Secondary | ICD-10-CM | POA: Diagnosis not present

## 2023-11-29 DIAGNOSIS — N390 Urinary tract infection, site not specified: Secondary | ICD-10-CM | POA: Diagnosis not present

## 2023-11-29 DIAGNOSIS — Z90710 Acquired absence of both cervix and uterus: Secondary | ICD-10-CM | POA: Diagnosis not present

## 2023-11-29 DIAGNOSIS — R32 Unspecified urinary incontinence: Secondary | ICD-10-CM | POA: Diagnosis not present

## 2023-11-29 DIAGNOSIS — I951 Orthostatic hypotension: Secondary | ICD-10-CM | POA: Diagnosis not present

## 2023-11-29 DIAGNOSIS — J449 Chronic obstructive pulmonary disease, unspecified: Secondary | ICD-10-CM | POA: Diagnosis not present

## 2023-11-29 DIAGNOSIS — N1831 Chronic kidney disease, stage 3a: Secondary | ICD-10-CM | POA: Diagnosis not present

## 2023-11-29 DIAGNOSIS — M4726 Other spondylosis with radiculopathy, lumbar region: Secondary | ICD-10-CM | POA: Diagnosis not present

## 2023-11-29 DIAGNOSIS — F411 Generalized anxiety disorder: Secondary | ICD-10-CM | POA: Diagnosis not present

## 2023-11-29 DIAGNOSIS — M170 Bilateral primary osteoarthritis of knee: Secondary | ICD-10-CM | POA: Diagnosis not present

## 2023-11-29 DIAGNOSIS — Z860101 Personal history of adenomatous and serrated colon polyps: Secondary | ICD-10-CM | POA: Diagnosis not present

## 2023-11-29 DIAGNOSIS — I5032 Chronic diastolic (congestive) heart failure: Secondary | ICD-10-CM | POA: Diagnosis not present

## 2023-11-29 DIAGNOSIS — Z923 Personal history of irradiation: Secondary | ICD-10-CM | POA: Diagnosis not present

## 2023-11-29 DIAGNOSIS — K5909 Other constipation: Secondary | ICD-10-CM | POA: Diagnosis not present

## 2023-11-29 DIAGNOSIS — E782 Mixed hyperlipidemia: Secondary | ICD-10-CM | POA: Diagnosis not present

## 2023-11-29 DIAGNOSIS — K219 Gastro-esophageal reflux disease without esophagitis: Secondary | ICD-10-CM | POA: Diagnosis not present

## 2023-11-29 DIAGNOSIS — Z85828 Personal history of other malignant neoplasm of skin: Secondary | ICD-10-CM | POA: Diagnosis not present

## 2023-11-29 DIAGNOSIS — E1122 Type 2 diabetes mellitus with diabetic chronic kidney disease: Secondary | ICD-10-CM | POA: Diagnosis not present

## 2023-11-29 DIAGNOSIS — F32A Depression, unspecified: Secondary | ICD-10-CM | POA: Diagnosis not present

## 2023-12-08 ENCOUNTER — Telehealth: Payer: Self-pay | Admitting: *Deleted

## 2023-12-08 ENCOUNTER — Ambulatory Visit: Payer: Self-pay | Admitting: Medical

## 2023-12-08 ENCOUNTER — Encounter: Payer: Self-pay | Admitting: Medical

## 2023-12-08 ENCOUNTER — Ambulatory Visit: Admitting: Medical

## 2023-12-08 VITALS — BP 124/86 | HR 94 | Wt 159.0 lb

## 2023-12-08 DIAGNOSIS — M7989 Other specified soft tissue disorders: Secondary | ICD-10-CM | POA: Diagnosis not present

## 2023-12-08 DIAGNOSIS — I5032 Chronic diastolic (congestive) heart failure: Secondary | ICD-10-CM | POA: Diagnosis not present

## 2023-12-08 DIAGNOSIS — E782 Mixed hyperlipidemia: Secondary | ICD-10-CM | POA: Diagnosis not present

## 2023-12-08 DIAGNOSIS — E1140 Type 2 diabetes mellitus with diabetic neuropathy, unspecified: Secondary | ICD-10-CM | POA: Diagnosis not present

## 2023-12-08 DIAGNOSIS — E1122 Type 2 diabetes mellitus with diabetic chronic kidney disease: Secondary | ICD-10-CM | POA: Diagnosis not present

## 2023-12-08 DIAGNOSIS — Z90710 Acquired absence of both cervix and uterus: Secondary | ICD-10-CM | POA: Diagnosis not present

## 2023-12-08 DIAGNOSIS — K5909 Other constipation: Secondary | ICD-10-CM | POA: Diagnosis not present

## 2023-12-08 DIAGNOSIS — Z8619 Personal history of other infectious and parasitic diseases: Secondary | ICD-10-CM | POA: Diagnosis not present

## 2023-12-08 DIAGNOSIS — K219 Gastro-esophageal reflux disease without esophagitis: Secondary | ICD-10-CM | POA: Diagnosis not present

## 2023-12-08 DIAGNOSIS — R4181 Age-related cognitive decline: Secondary | ICD-10-CM | POA: Diagnosis not present

## 2023-12-08 DIAGNOSIS — E039 Hypothyroidism, unspecified: Secondary | ICD-10-CM | POA: Diagnosis not present

## 2023-12-08 DIAGNOSIS — Z85828 Personal history of other malignant neoplasm of skin: Secondary | ICD-10-CM | POA: Diagnosis not present

## 2023-12-08 DIAGNOSIS — M170 Bilateral primary osteoarthritis of knee: Secondary | ICD-10-CM | POA: Diagnosis not present

## 2023-12-08 DIAGNOSIS — N390 Urinary tract infection, site not specified: Secondary | ICD-10-CM | POA: Diagnosis not present

## 2023-12-08 DIAGNOSIS — F411 Generalized anxiety disorder: Secondary | ICD-10-CM | POA: Diagnosis not present

## 2023-12-08 DIAGNOSIS — F32A Depression, unspecified: Secondary | ICD-10-CM | POA: Diagnosis not present

## 2023-12-08 DIAGNOSIS — Z923 Personal history of irradiation: Secondary | ICD-10-CM | POA: Diagnosis not present

## 2023-12-08 DIAGNOSIS — Z87891 Personal history of nicotine dependence: Secondary | ICD-10-CM | POA: Diagnosis not present

## 2023-12-08 DIAGNOSIS — I951 Orthostatic hypotension: Secondary | ICD-10-CM | POA: Diagnosis not present

## 2023-12-08 DIAGNOSIS — J449 Chronic obstructive pulmonary disease, unspecified: Secondary | ICD-10-CM | POA: Diagnosis not present

## 2023-12-08 DIAGNOSIS — N1831 Chronic kidney disease, stage 3a: Secondary | ICD-10-CM | POA: Diagnosis not present

## 2023-12-08 DIAGNOSIS — I872 Venous insufficiency (chronic) (peripheral): Secondary | ICD-10-CM | POA: Diagnosis not present

## 2023-12-08 DIAGNOSIS — Z860101 Personal history of adenomatous and serrated colon polyps: Secondary | ICD-10-CM | POA: Diagnosis not present

## 2023-12-08 DIAGNOSIS — I130 Hypertensive heart and chronic kidney disease with heart failure and stage 1 through stage 4 chronic kidney disease, or unspecified chronic kidney disease: Secondary | ICD-10-CM | POA: Diagnosis not present

## 2023-12-08 DIAGNOSIS — R32 Unspecified urinary incontinence: Secondary | ICD-10-CM | POA: Diagnosis not present

## 2023-12-08 DIAGNOSIS — M4726 Other spondylosis with radiculopathy, lumbar region: Secondary | ICD-10-CM | POA: Diagnosis not present

## 2023-12-08 MED ORDER — FUROSEMIDE 20 MG PO TABS: 20.0000 mg | ORAL_TABLET | Freq: Every day | ORAL | 1 refills | Status: DC

## 2023-12-08 NOTE — Patient Instructions (Signed)
 Recommendations Begin Lasix  20 mg 1/2 to 1 tablet daily for the next few days, then use it as needed for leg swelling Continue to use leg elevation when possible Consider over-the-counter compression hose such as through [employer] or medical supply store Limit salt intake We were unfortunately unable to get blood today.  When you have blood work next week with gastroenterology see if they will add a basic metabolic panel to check your kidney and electrolytes Make sure you hydrate well going into your neck with gastroenterology so they can get labs If new symptoms or swelling, or swelling of the legs or arms, worse shortness of breath then call or get reevaluated right away Monitor your weights at home when possible.  Check your weights preferably once a day and if you see an increase in 3 to 4 pounds in the given day then give us  a call or recheck

## 2023-12-08 NOTE — Telephone Encounter (Signed)
 Copied from CRM [id number]. Topic: General - Running Late >> Dec 08, 2023  2:33 PM Willma R wrote: Patient/patient representative is calling because they are running late for an appointment. Will arrive at 2:38 pm   Noted.

## 2023-12-08 NOTE — Telephone Encounter (Signed)
 FYI Only or Action Required?: FYI only for provider.  Patient was last seen in primary care on 10/17/2023 by Bulah Alm RAMAN, PA-C.  Called Nurse Triage reporting Edema.  Symptoms began several days ago.  Symptoms are: gradually worsening.  Triage Disposition: See HCP Within 4 Hours (Or PCP Triage)  Patient/caregiver understands and will follow disposition?: Yes                       Copied from CRM #[id number]. Topic: Clinical - Red Word Triage >> Dec 08, 2023 11:27 AM Brandi Stephens wrote: Red Word that prompted transfer to Nurse Triage: Swelling feet 2+/3+ edema  / pain in feet - hands slightly swollen. Reason for Disposition  [1] Thigh, calf, or ankle swelling AND [2] bilateral AND [3] 1 side is more swollen  Answer Assessment - Initial Assessment Questions This RN spoke with pt and pt's home health nurse. This RN scheduled pt for an appointment today with PCP in office. This RN educated pt on new-worsening symptoms and when to call back/seek emergent care. Pt verbalized understanding and agrees to plan.    Feet and ankle swelling 2+/3+ edema started 3 days ago Calves feel tight per pt, feel spongy per home caregiver, pain from her neuropathy Right ankle more swollen than other ankle Some redness Progressively getting worse Hands are a little tight- pt can tell by rings 100/60, which is what its been normal; other vitals normal 98% Lungs are clear Denies coughing, chest pain, difficulty breathing Weight has been consistent around 152 lbs Pt not feeling that well today- sluggish and falling asleep, low volume in speech  Protocols used: Ankle Swelling-A-AH

## 2023-12-08 NOTE — Progress Notes (Signed)
 Subjective:  Brandi Stephens is a 78 y.o. female who presents for Chief Complaint  Patient presents with   Acute Visit    Ankle and feet swelling. Started about 3 or 4 days ago.      Here with her daughter.  She notes new complaint of swelling in the feet and ankles.  Here for ankle and feet swelling x 3-4 days.  Maybe some right arm swelling.  She has had swelling in her ankles and feet in the past but has been a while.  She has been on Lasix  in the past.  No SOB, no chest pain.  No salty foods.  No prolonged standing or sitting in general.  Has been using leg elevation.  Doesn't normally wear compression hose.  She uses her walker regularly.  She is fairly sedentary  Laying down in the bed does not seem to relieve the swelling.  She also does not sleep that well.  She gets up at night to use the bathroom and then cannot get back to sleep although she does take trazodone  for sleep.  She uses on average 75 mg of trazodone  nightly  She has been seeing gastroenterology in follow-up on elevated liver test.  Her liver tests are trending downwards, improving.  No other new symptoms.  No history of heavy alcohol  use or abuse in the past  No other aggravating or relieving factors.    No other c/o.  Past Medical History:  Diagnosis Date   AIN (anal intraepithelial neoplasia) anal canal    Anemia    with pregnancy   Anxiety    Arthritis    knees, lower back, knees   Chronic constipation    Chronic diastolic CHF (congestive heart failure) (HCC) 07/05/2019   CKD (chronic kidney disease)    Coarse tremors    Essential   COPD (chronic obstructive pulmonary disease) (HCC)    2017 chest CT, history of   Degenerative lumbar spinal stenosis    Depression    Depression 07/05/2019   Drug dependency (HCC)    Elevated PTHrP level 05/09/2019   EtOH use 07/05/2019   Pt denies heavy drinking however   Gastroparesis    GERD (gastroesophageal reflux disease)    pt denies   Gout    Grade I  diastolic dysfunction 02/01/2016   Noted ECHO   Hemorrhoids    History of adenomatous polyp of colon    History of basal cell carcinoma (BCC) excision 2015   nose   History of sepsis 05/14/2014   post lumbar surgery   History of shingles    x2   Hyperlipidemia    Hypertension    Hypothyroidism    Irregular heart beat    history of while undergoing radiation   Malignant neoplasm of upper-inner quadrant of left breast in female, estrogen receptor positive [facility]) oncologist-  dr layla--  per lov in epic no recurrence   dx 07-13-2015  Left breast invasive ductal carcinoma, Stage IA, Grade 1 (TXN0),  09-16-2015  s/p  left breast lumpectomy with sln bx's,  completed radiation 11-18-2015,  started antiestrogen therapy 12-14-2015   Neuropathy    Left foot and back of left leg   Obesity (BMI 30-39.9) 07/05/2019   Personal history of radiation therapy    completed 11-18-2015  left breast   Pneumonia    PONV (postoperative nausea and vomiting)    Prediabetes    diet controlled, no med   Restless leg syndrome    Seasonal allergies  Seizure (HCC) 05/2015   due to sepsis, only one time episode   Tremor    Wears partial dentures    lower    Current Outpatient Medications on File Prior to Visit  Medication Sig Dispense Refill   allopurinol  (ZYLOPRIM ) 300 MG tablet Take 1 tablet (300 mg total) by mouth daily. 90 tablet 1   Biotin  3 MG TABS Take 3 mg by mouth daily.     cefadroxil  (DURICEF) 500 MG capsule Take 1 capsule (500 mg total) by mouth 2 (two) times daily. 60 capsule 11   Cholecalciferol (VITAMIN D3) 50 MCG (2000 UT) capsule Take 1 capsule (2,000 Units total) by mouth daily. 90 capsule 3   colchicine  0.6 MG tablet TAKE 2 TABLETS BY MOUTH ONCE, THEN 1 TABLET AN HOUR LATER, THEN 1 TABLET TWICE DAILY FOR 3 DAYS 30 tablet 2   ibuprofen (ADVIL) 200 MG tablet Take 200 mg by mouth every 6 (six) hours as needed for mild pain (pain score 1-3).     levothyroxine  (SYNTHROID ) 75 MCG tablet  Take 1 tablet (75 mcg total) by mouth daily. 90 tablet 2   pantoprazole  (PROTONIX ) 40 MG tablet Take 1 tablet (40 mg total) by mouth 2 (two) times daily. 60 tablet 1   polyethylene glycol powder (GLYCOLAX /MIRALAX ) 17 GM/SCOOP powder Take 17 g by mouth daily as needed for moderate constipation.     pregabalin  (LYRICA ) 50 MG capsule Take 1 capsule (50 mg total) by mouth 3 (three) times daily. 270 capsule 3   Propylene Glycol (SYSTANE BALANCE OP) Place 1 drop into both eyes 4 (four) times daily as needed (dry eyes).     tiZANidine  (ZANAFLEX ) 4 MG tablet Take 1 tablet (4 mg total) by mouth every 6 (six) hours as needed for muscle spasms. 120 tablet 3   traZODone  (DESYREL ) 50 MG tablet Take 1 tablet (50 mg total) by mouth at bedtime. 90 tablet 3   lidocaine  (HM LIDOCAINE  PATCH) 4 % Place 1 patch onto the skin daily. (Patient not taking: Reported on 12/08/2023)     midodrine  (PROAMATINE ) 5 MG tablet Take 1 tablet (5 mg total) by mouth 2 (two) times daily with a meal. (Patient not taking: Reported on 12/08/2023) 60 tablet 0   No current facility-administered medications on file prior to visit.     The following portions of the patient's history were reviewed and updated as appropriate: allergies, current medications, past family history, past medical history, past social history, past surgical history and problem list.  ROS Otherwise as in subjective above    Objective: BP 124/86   Pulse 94   Wt 159 lb (72.1 kg)   SpO2 99%   BMI 24.90 kg/m   Wt Readings from Last 3 Encounters:  12/08/23 159 lb (72.1 kg)  10/17/23 156 lb (70.8 kg)  09/07/23 152 lb (68.9 kg)   BP Readings from Last 3 Encounters:  12/08/23 124/86  10/17/23 120/68  09/07/23 (!) 105/52    General appearance: alert, no distress, well developed, well nourished Neck: supple, no lymphadenopathy, no thyromegaly, no masses, no JVD Heart: RRR, normal S1, S2, no murmurs Lungs: CTA bilaterally, no wheezes, rhonchi, or  rales Pulses: 2+ radial pulses, 1+ pedal pulses, slightly delayed cap refill of feet From just proximal to the ankle throughout the ankle and feet bilaterally his leg swelling and 1+ pitting edema but not swelling throughout the legs.  No obvious swelling of the hands or arms.    Assessment: Encounter Diagnoses  Name Primary?  Leg swelling Yes   Chronic diastolic CHF (congestive heart failure) (HCC)    Chronic venous insufficiency      Plan: We discussed her concerns.  She will follow-up with gastroenterology in regards to her elevated liver test.  We are going to do blood work but we are unable to get blood from her today.  We discussed the following recommendations.  Recommendations Begin Lasix  20 mg 1/2 to 1 tablet daily for the next few days, then use it as needed for leg swelling Continue to use leg elevation when possible Consider over-the-counter compression hose such as through [employer] or medical supply store Limit salt intake We were unfortunately unable to get blood today.  When you have blood work next week with gastroenterology see if they will add a basic metabolic panel to check your kidney and electrolytes Make sure you hydrate well going into your neck with gastroenterology so they can get labs If new symptoms or swelling, or swelling of the legs or arms, worse shortness of breath then call or get reevaluated right away Monitor your weights at home when possible.  Check your weights preferably once a day and if you see an increase in 3 to 4 pounds in the given day then give us  a call or recheck  [place] was seen today for acute visit.  Diagnoses and all orders for this visit:  Leg swelling -     Basic metabolic panel with GFR -     Ammonia  Chronic diastolic CHF (congestive heart failure) (HCC)  Chronic venous insufficiency  Other orders -     furosemide  (LASIX ) 20 MG tablet; Take 1 tablet (20 mg total) by mouth daily.    Follow up: call report 1 wk

## 2023-12-12 ENCOUNTER — Encounter (HOSPITAL_COMMUNITY): Payer: Self-pay | Admitting: Emergency Medicine

## 2023-12-12 ENCOUNTER — Other Ambulatory Visit: Payer: Self-pay

## 2023-12-12 ENCOUNTER — Emergency Department (HOSPITAL_COMMUNITY)

## 2023-12-12 ENCOUNTER — Inpatient Hospital Stay (HOSPITAL_COMMUNITY)
Admission: EM | Admit: 2023-12-12 | Discharge: 2023-12-21 | DRG: 312 | Disposition: A | Discharge status: Skilled Nursing Facility | Attending: Internal Medicine | Admitting: Internal Medicine

## 2023-12-12 DIAGNOSIS — M109 Gout, unspecified: Secondary | ICD-10-CM | POA: Diagnosis present

## 2023-12-12 DIAGNOSIS — R55 Syncope and collapse: Secondary | ICD-10-CM | POA: Diagnosis not present

## 2023-12-12 DIAGNOSIS — Z79899 Other long term (current) drug therapy: Secondary | ICD-10-CM

## 2023-12-12 DIAGNOSIS — K820 Obstruction of gallbladder: Secondary | ICD-10-CM | POA: Diagnosis not present

## 2023-12-12 DIAGNOSIS — R778 Other specified abnormalities of plasma proteins: Secondary | ICD-10-CM | POA: Diagnosis not present

## 2023-12-12 DIAGNOSIS — Z886 Allergy status to analgesic agent status: Secondary | ICD-10-CM

## 2023-12-12 DIAGNOSIS — G9389 Other specified disorders of brain: Secondary | ICD-10-CM | POA: Diagnosis not present

## 2023-12-12 DIAGNOSIS — E1169 Type 2 diabetes mellitus with other specified complication: Secondary | ICD-10-CM | POA: Diagnosis present

## 2023-12-12 DIAGNOSIS — J449 Chronic obstructive pulmonary disease, unspecified: Secondary | ICD-10-CM | POA: Diagnosis present

## 2023-12-12 DIAGNOSIS — Z880 Allergy status to penicillin: Secondary | ICD-10-CM

## 2023-12-12 DIAGNOSIS — Z808 Family history of malignant neoplasm of other organs or systems: Secondary | ICD-10-CM

## 2023-12-12 DIAGNOSIS — E1143 Type 2 diabetes mellitus with diabetic autonomic (poly)neuropathy: Secondary | ICD-10-CM | POA: Diagnosis present

## 2023-12-12 DIAGNOSIS — Z923 Personal history of irradiation: Secondary | ICD-10-CM

## 2023-12-12 DIAGNOSIS — E876 Hypokalemia: Secondary | ICD-10-CM | POA: Diagnosis present

## 2023-12-12 DIAGNOSIS — K219 Gastro-esophageal reflux disease without esophagitis: Secondary | ICD-10-CM | POA: Diagnosis present

## 2023-12-12 DIAGNOSIS — Z823 Family history of stroke: Secondary | ICD-10-CM

## 2023-12-12 DIAGNOSIS — Z87891 Personal history of nicotine dependence: Secondary | ICD-10-CM

## 2023-12-12 DIAGNOSIS — G2581 Restless legs syndrome: Secondary | ICD-10-CM | POA: Diagnosis present

## 2023-12-12 DIAGNOSIS — R7989 Other specified abnormal findings of blood chemistry: Secondary | ICD-10-CM

## 2023-12-12 DIAGNOSIS — Z803 Family history of malignant neoplasm of breast: Secondary | ICD-10-CM

## 2023-12-12 DIAGNOSIS — R197 Diarrhea, unspecified: Secondary | ICD-10-CM | POA: Diagnosis present

## 2023-12-12 DIAGNOSIS — E870 Hyperosmolality and hypernatremia: Secondary | ICD-10-CM | POA: Diagnosis present

## 2023-12-12 DIAGNOSIS — I951 Orthostatic hypotension: Principal | ICD-10-CM | POA: Diagnosis present

## 2023-12-12 DIAGNOSIS — Z853 Personal history of malignant neoplasm of breast: Secondary | ICD-10-CM

## 2023-12-12 DIAGNOSIS — E034 Atrophy of thyroid (acquired): Secondary | ICD-10-CM

## 2023-12-12 DIAGNOSIS — Z8673 Personal history of transient ischemic attack (TIA), and cerebral infarction without residual deficits: Secondary | ICD-10-CM

## 2023-12-12 DIAGNOSIS — R42 Dizziness and giddiness: Secondary | ICD-10-CM | POA: Diagnosis not present

## 2023-12-12 DIAGNOSIS — E2740 Unspecified adrenocortical insufficiency: Secondary | ICD-10-CM

## 2023-12-12 DIAGNOSIS — Z79890 Hormone replacement therapy (postmenopausal): Secondary | ICD-10-CM

## 2023-12-12 DIAGNOSIS — Z981 Arthrodesis status: Secondary | ICD-10-CM

## 2023-12-12 DIAGNOSIS — R531 Weakness: Secondary | ICD-10-CM

## 2023-12-12 DIAGNOSIS — Z888 Allergy status to other drugs, medicaments and biological substances status: Secondary | ICD-10-CM

## 2023-12-12 DIAGNOSIS — E039 Hypothyroidism, unspecified: Secondary | ICD-10-CM | POA: Diagnosis present

## 2023-12-12 DIAGNOSIS — Z9842 Cataract extraction status, left eye: Secondary | ICD-10-CM

## 2023-12-12 DIAGNOSIS — G8929 Other chronic pain: Secondary | ICD-10-CM | POA: Diagnosis present

## 2023-12-12 DIAGNOSIS — I5032 Chronic diastolic (congestive) heart failure: Secondary | ICD-10-CM | POA: Diagnosis present

## 2023-12-12 DIAGNOSIS — R54 Age-related physical debility: Secondary | ICD-10-CM | POA: Diagnosis present

## 2023-12-12 DIAGNOSIS — Z85828 Personal history of other malignant neoplasm of skin: Secondary | ICD-10-CM

## 2023-12-12 DIAGNOSIS — K3184 Gastroparesis: Secondary | ICD-10-CM | POA: Diagnosis present

## 2023-12-12 DIAGNOSIS — Z6825 Body mass index (BMI) 25.0-25.9, adult: Secondary | ICD-10-CM

## 2023-12-12 DIAGNOSIS — G4700 Insomnia, unspecified: Secondary | ICD-10-CM | POA: Diagnosis present

## 2023-12-12 DIAGNOSIS — E669 Obesity, unspecified: Secondary | ICD-10-CM | POA: Diagnosis present

## 2023-12-12 DIAGNOSIS — R296 Repeated falls: Secondary | ICD-10-CM | POA: Diagnosis not present

## 2023-12-12 DIAGNOSIS — E11649 Type 2 diabetes mellitus with hypoglycemia without coma: Secondary | ICD-10-CM | POA: Diagnosis present

## 2023-12-12 DIAGNOSIS — F05 Delirium due to known physiological condition: Secondary | ICD-10-CM | POA: Diagnosis not present

## 2023-12-12 DIAGNOSIS — E118 Type 2 diabetes mellitus with unspecified complications: Secondary | ICD-10-CM

## 2023-12-12 DIAGNOSIS — Z792 Long term (current) use of antibiotics: Secondary | ICD-10-CM

## 2023-12-12 DIAGNOSIS — Z801 Family history of malignant neoplasm of trachea, bronchus and lung: Secondary | ICD-10-CM

## 2023-12-12 DIAGNOSIS — F32A Depression, unspecified: Secondary | ICD-10-CM | POA: Diagnosis present

## 2023-12-12 DIAGNOSIS — Z885 Allergy status to narcotic agent status: Secondary | ICD-10-CM

## 2023-12-12 DIAGNOSIS — E785 Hyperlipidemia, unspecified: Secondary | ICD-10-CM | POA: Diagnosis present

## 2023-12-12 DIAGNOSIS — F419 Anxiety disorder, unspecified: Secondary | ICD-10-CM | POA: Diagnosis present

## 2023-12-12 DIAGNOSIS — Z961 Presence of intraocular lens: Secondary | ICD-10-CM | POA: Diagnosis present

## 2023-12-12 DIAGNOSIS — R7401 Elevation of levels of liver transaminase levels: Secondary | ICD-10-CM | POA: Diagnosis not present

## 2023-12-12 DIAGNOSIS — Z043 Encounter for examination and observation following other accident: Secondary | ICD-10-CM | POA: Diagnosis not present

## 2023-12-12 DIAGNOSIS — Z800 Family history of malignant neoplasm of digestive organs: Secondary | ICD-10-CM

## 2023-12-12 DIAGNOSIS — I110 Hypertensive heart disease with heart failure: Secondary | ICD-10-CM | POA: Diagnosis present

## 2023-12-12 LAB — URINALYSIS, ROUTINE W REFLEX MICROSCOPIC
Bilirubin Urine: NEGATIVE
Glucose, UA: NEGATIVE mg/dL
Hgb urine dipstick: NEGATIVE
Ketones, ur: NEGATIVE mg/dL
Nitrite: NEGATIVE
Protein, ur: NEGATIVE mg/dL
Specific Gravity, Urine: 1.012 (ref 1.005–1.030)
pH: 5 (ref 5.0–8.0)

## 2023-12-12 LAB — COMPREHENSIVE METABOLIC PANEL WITH GFR
ALT: 414 U/L — ABNORMAL HIGH (ref 0–44)
AST: 426 U/L — ABNORMAL HIGH (ref 15–41)
Albumin: 3.2 g/dL — ABNORMAL LOW (ref 3.5–5.0)
Alkaline Phosphatase: 108 U/L (ref 38–126)
Anion gap: 14 (ref 5–15)
BUN: 22 mg/dL (ref 8–23)
CO2: 26 mmol/L (ref 22–32)
Calcium: 9.6 mg/dL (ref 8.9–10.3)
Chloride: 101 mmol/L (ref 98–111)
Creatinine, Ser: 1.15 mg/dL — ABNORMAL HIGH (ref 0.44–1.00)
GFR, Estimated: 49 mL/min — ABNORMAL LOW (ref >60)
Glucose, Bld: 178 mg/dL — ABNORMAL HIGH (ref 70–99)
Potassium: 3.5 mmol/L (ref 3.5–5.1)
Sodium: 140 mmol/L (ref 135–145)
Total Bilirubin: 3.3 mg/dL — ABNORMAL HIGH (ref 0.0–1.2)
Total Protein: 5 g/dL — ABNORMAL LOW (ref 6.5–8.1)

## 2023-12-12 LAB — CBC
HCT: 41.5 % (ref 36.0–46.0)
Hemoglobin: 13.9 g/dL (ref 12.0–15.0)
MCH: 32.2 pg (ref 26.0–34.0)
MCHC: 33.5 g/dL (ref 30.0–36.0)
MCV: 96.1 fL (ref 80.0–100.0)
Platelets: 117 K/uL — ABNORMAL LOW (ref 150–400)
RBC: 4.32 MIL/uL (ref 3.87–5.11)
RDW: 14.9 % (ref 11.5–15.5)
WBC: 7.1 K/uL (ref 4.0–10.5)
nRBC: 0 % (ref 0.0–0.2)

## 2023-12-12 LAB — TROPONIN T, HIGH SENSITIVITY: Troponin T High Sensitivity: 35 ng/L — ABNORMAL HIGH (ref 0–19)

## 2023-12-12 MED ORDER — BISACODYL 5 MG PO TBEC: 5.0000 mg | DELAYED_RELEASE_TABLET | Freq: Every day | ORAL | Status: DC | PRN

## 2023-12-12 MED ORDER — ACETAMINOPHEN 325 MG PO TABS: 650.0000 mg | ORAL_TABLET | Freq: Four times a day (QID) | ORAL | Status: DC | PRN

## 2023-12-12 MED ORDER — ACETAMINOPHEN 650 MG RE SUPP: 650.0000 mg | Freq: Four times a day (QID) | RECTAL | Status: DC | PRN

## 2023-12-12 MED ORDER — SENNOSIDES-DOCUSATE SODIUM 8.6-50 MG PO TABS: 1.0000 | ORAL_TABLET | Freq: Every evening | ORAL | Status: DC | PRN

## 2023-12-12 MED ORDER — ONDANSETRON HCL 4 MG/2ML IJ SOLN
4.0000 mg | Freq: Four times a day (QID) | INTRAMUSCULAR | Status: DC | PRN
  Administered 2023-12-15 – 2023-12-18 (×2): 4 mg via INTRAVENOUS
  Filled 2023-12-12 (×2): qty 2

## 2023-12-12 MED ORDER — ONDANSETRON HCL 4 MG PO TABS
4.0000 mg | ORAL_TABLET | Freq: Four times a day (QID) | ORAL | Status: DC | PRN
Start: 2023-12-12 — End: 2023-12-21
  Administered 2023-12-14: 4 mg via ORAL
  Filled 2023-12-12: qty 1

## 2023-12-12 MED ORDER — SODIUM CHLORIDE 0.9 % IV BOLUS
500.0000 mL | Freq: Once | INTRAVENOUS | Status: AC
  Administered 2023-12-12: 500 mL via INTRAVENOUS

## 2023-12-12 MED ORDER — ENOXAPARIN SODIUM 40 MG/0.4ML IJ SOSY
40.0000 mg | PREFILLED_SYRINGE | INTRAMUSCULAR | Status: DC
  Administered 2023-12-13 – 2023-12-21 (×9): 40 mg via SUBCUTANEOUS
  Filled 2023-12-12 (×10): qty 0.4

## 2023-12-12 NOTE — ED Provider Notes (Signed)
  EMERGENCY DEPARTMENT AT [facility] Provider Note   CSN: [id number] Arrival date & time: 12/12/23  1539     Patient presents with: Fall, Altered Mental Status, and Weakness   Brandi Stephens is a 78 y.o. female.  78 year old female presents ED via EMS with complaints of weakness, dizziness x 3 days and a fall today due to syncopal episode.  Patient reports she has been very weak at home and has only been able to get up off the couch to use the restroom.  Patient uses walker at home and has her husband for help.  Patient has had foul odors to urine but denies any urinary pain or incontinence.  Patient has had decreased activity at home for the past 3 days with complaints of weakness.  Patient has extensive medical history and multiple medications prescribed.     Prior to Admission medications   Medication Sig Start Date End Date Taking? Authorizing Provider  allopurinol  (ZYLOPRIM ) 300 MG tablet Take 1 tablet (300 mg total) by mouth daily. 10/17/23  Yes Tysinger, Alm RAMAN, PA-C  amitriptyline  (ELAVIL ) 25 MG tablet Take 25 mg by mouth at bedtime. 12/11/23  Yes [provider]  Biotin  3 MG TABS Take 3 mg by mouth daily.   Yes [provider]  cefadroxil  (DURICEF) 500 MG capsule Take 1 capsule (500 mg total) by mouth 2 (two) times daily. 01/02/23  Yes Luiz Channel, MD  Cholecalciferol  (VITAMIN D3) 50 MCG (2000 UT) capsule Take 1 capsule (2,000 Units total) by mouth daily. 10/03/22  Yes Tysinger, Alm RAMAN, PA-C  colchicine  0.6 MG tablet TAKE 2 TABLETS BY MOUTH ONCE, THEN 1 TABLET AN HOUR LATER, THEN 1 TABLET TWICE DAILY FOR 3 DAYS Patient taking differently: Take 0.6 mg by mouth as needed (Gout). 10/17/23  Yes Tysinger, Alm RAMAN, PA-C  cyanocobalamin  1000 MCG tablet Take 1,000 mcg by mouth daily.   Yes [provider]  furosemide  (LASIX ) 20 MG tablet Take 1 tablet (20 mg total) by mouth daily. 12/08/23  Yes Tysinger, Alm RAMAN, PA-C  ibuprofen  (ADVIL ) 200  MG tablet Take 200 mg by mouth every 6 (six) hours as needed for mild pain (pain score 1-3).   Yes [provider]  levothyroxine  (SYNTHROID ) 75 MCG tablet Take 1 tablet (75 mcg total) by mouth daily. 07/11/23  Yes Tysinger, Alm RAMAN, PA-C  pantoprazole  (PROTONIX ) 40 MG tablet Take 1 tablet (40 mg total) by mouth 2 (two) times daily. 07/23/22 04/06/24 Yes Odell Celinda Balo, MD  polyethylene glycol powder (GLYCOLAX /MIRALAX ) 17 GM/SCOOP powder Take 17 g by mouth daily as needed for moderate constipation.   Yes [provider]  pregabalin  (LYRICA ) 50 MG capsule Take 1 capsule (50 mg total) by mouth 3 (three) times daily. 08/23/23  Yes Raulkar, Sven SQUIBB, MD  Propylene Glycol (SYSTANE BALANCE OP) Place 1 drop into both eyes 4 (four) times daily as needed (dry eyes).   Yes [provider]  tiZANidine  (ZANAFLEX ) 4 MG tablet Take 1 tablet (4 mg total) by mouth every 6 (six) hours as needed for muscle spasms. 07/06/23  Yes Raulkar, Sven SQUIBB, MD  traZODone  (DESYREL ) 50 MG tablet Take 1 tablet (50 mg total) by mouth at bedtime. Patient taking differently: Take 50 mg by mouth at bedtime as needed for sleep. 08/23/23  Yes Raulkar, Sven SQUIBB, MD  lidocaine  (HM LIDOCAINE  PATCH) 4 % Place 1 patch onto the skin daily. Patient not taking: No sig reported    [provider]  midodrine  (PROAMATINE ) 5 MG tablet Take 1 tablet (5 mg total) by mouth 2 (two) times daily with a meal. Patient not taking: No sig reported 11/02/23   Tysinger, Alm RAMAN, PA-C  oxyCODONE  (OXY IR/ROXICODONE ) 5 MG immediate release tablet Take 5 mg by mouth 3 (three) times daily as needed. Patient not taking: Reported on 12/12/2023 09/29/23   [provider]    Allergies: Ampicillin , Gabapentin , Penicillins, Percocet [oxycodone -acetaminophen ], Crestor [rosuvastatin], Hydrocodone , Ozempic  (0.25 or 0.5 mg-dose) [semaglutide (0.25 or 0.5mg -dos)], and Other    Review of Systems  Constitutional:  Positive for  activity change.  Genitourinary:  Positive for dysuria.  Neurological:  Positive for dizziness, syncope, weakness and light-headedness.  All other systems reviewed and are negative.   Updated Vital Signs BP (!) 158/89 (BP Location: Right Arm)   Pulse (!) 56   Temp 97.6 F (36.4 C) (Oral)   Resp 16   SpO2 99%   Physical Exam Vitals and nursing note reviewed.  Constitutional:      General: She is not in acute distress.    Appearance: Normal appearance. She is not ill-appearing.  HENT:     Head: Normocephalic and atraumatic.  Eyes:     Extraocular Movements: Extraocular movements intact.     Pupils: Pupils are equal, round, and reactive to light.  Cardiovascular:     Rate and Rhythm: Bradycardia present.  Pulmonary:     Effort: Pulmonary effort is normal. No respiratory distress.  Abdominal:     General: Abdomen is flat.     Palpations: Abdomen is soft.     Tenderness: There is no guarding.  Musculoskeletal:        General: Normal range of motion.     Cervical back: Normal range of motion.  Skin:    General: Skin is warm and dry.  Neurological:     General: No focal deficit present.     Mental Status: She is alert.     Motor: Weakness present.  Psychiatric:        Mood and Affect: Mood normal.        Behavior: Behavior normal.     (all labs ordered are listed, but only abnormal results are displayed) Labs Reviewed  COMPREHENSIVE METABOLIC PANEL WITH GFR - Abnormal; Notable for the following components:      Result Value   Glucose, Bld 178 (*)    Creatinine, Ser 1.15 (*)    Total Protein 5.0 (*)    Albumin  3.2 (*)    AST 426 (*)    ALT 414 (*)    Total Bilirubin 3.3 (*)    GFR, Estimated 49 (*)    All other components within normal limits  CBC - Abnormal; Notable for the following components:   Platelets 117 (*)    All other components within normal limits  URINALYSIS, ROUTINE W REFLEX MICROSCOPIC - Abnormal; Notable for the following components:    APPearance HAZY (*)    Leukocytes,Ua TRACE (*)    Bacteria, UA RARE (*)    All other components within normal limits  TROPONIN T, HIGH SENSITIVITY - Abnormal; Notable for the following components:   Troponin T High Sensitivity 35 (*)    All other components within normal limits  BASIC METABOLIC PANEL WITH GFR  CBC  CBG MONITORING, ED  TROPONIN T, HIGH SENSITIVITY    EKG: EKG Interpretation Date/Time:  Tuesday December 12 2023 17:07:03 EDT Ventricular Rate:  48 PR Interval:  196 QRS Duration:  114 QT Interval:  465 QTC Calculation: 416 R Axis:   7  Text Interpretation: Sinus bradycardia Borderline intraventricular conduction delay Nonspecific T abnormalities, anterior leads Confirmed by Darra Chew [phone]) on 12/12/2023 7:51:37 PM  Radiology: US  Abdomen Limited RUQ (LIVER/GB) Result Date: 12/12/2023 CLINICAL DATA:  151470 RUQ abdominal pain [id number] EXAM: ULTRASOUND ABDOMEN LIMITED RIGHT UPPER QUADRANT COMPARISON:  None Available. FINDINGS: Gallbladder: Contracted gallbladder. No gallstones or wall thickening visualized. No sonographic Murphy sign noted by sonographer. Common bile duct: Diameter: 3 mm Liver: Limited evaluation of the left hepatic lobe. No focal lesion identified. Within normal limits in parenchymal echogenicity. Portal vein is patent on color Doppler imaging with normal direction of blood flow towards the liver. Other: None. IMPRESSION: 1. Unremarkable right upper quadrant ultrasound. 2. Contracted gallbladder. Electronically Signed   By: Morgane  Naveau M.D.   On: 12/12/2023 19:44   CT Head Wo Contrast Result Date: 12/12/2023 CLINICAL DATA:  Dizziness and weakness with subsequent fall. EXAM: CT HEAD WITHOUT CONTRAST TECHNIQUE: Contiguous axial images were obtained from the base of the skull through the vertex without intravenous contrast. RADIATION DOSE REDUCTION: This exam was performed according to the departmental dose-optimization program which includes automated  exposure control, adjustment of the mA and/or kV according to patient size and/or use of iterative reconstruction technique. COMPARISON:  July 23, 2022 FINDINGS: Brain: There is generalized cerebral atrophy with widening of the extra-axial spaces and ventricular dilatation. There are areas of decreased attenuation within the white matter tracts of the supratentorial brain, consistent with microvascular disease changes. Vascular: Mild to moderate severity bilateral cavernous carotid artery calcification is noted. Skull: Normal. Negative for fracture or focal lesion. Sinuses/Orbits: No acute finding. Other: None. IMPRESSION: 1. Generalized cerebral atrophy and microvascular disease changes of the supratentorial brain. 2. No acute intracranial abnormality. Electronically Signed   By: Suzen Dials M.D.   On: 12/12/2023 19:30   DG Chest 2 View Result Date: 12/12/2023 CLINICAL DATA:  Status post fall. EXAM: CHEST - 2 VIEW COMPARISON:  None Availa chest radiograph July 18, 2022.  Ble. FINDINGS: The heart size and mediastinal contours are within normal limits. No suspicious pulmonary nodule or consolidation. The visualized skeletal structures are unremarkable. No acute fracture identified. Mild compression deformity of the thoracic spine, similar to prior. Surgical clips overlying the left chest wall from prior lumpectomy. IMPRESSION: No active cardiopulmonary disease.  No acute osseous lesion. Electronically Signed   By: Megan  Zare M.D.   On: 12/12/2023 17:54     Procedures   Medications Ordered in the ED  senna-docusate (Senokot-S) tablet 1 tablet (has no administration in time range)  bisacodyl  (DULCOLAX) EC tablet 5 mg (has no administration in time range)  ondansetron  (ZOFRAN ) tablet 4 mg (has no administration in time range)    Or  ondansetron  (ZOFRAN ) injection 4 mg (has no administration in time range)  enoxaparin  (LOVENOX ) injection 40 mg (has no administration in time range)  sodium chloride   0.9 % bolus 500 mL (0 mLs Intravenous Stopped 12/12/23 2159)    78 y.o. female presents to the ED for concern of Fall, Altered Mental Status, and Weakness     This involves an extensive number of treatment options, and is a complaint that carries with it a high risk of complications and morbidity.  The emergent differential diagnosis prior to evaluation includes, but is not limited to: ACS, sepsis, PE, CVA, pneumonia, UTI,  This is not an exhaustive differential.   Past Medical History / Co-morbidities / Social History: Hx of hypertension,  diabetes, CVA, CHF with ejection fraction greater than 65, neuropathy, anemia, Social Determinants of Health include: Former smoker and former alcohol  user  Additional History:  Obtained by chart review.  Notably management of CHF by family medicine, along with hypertension  Lab Tests: I ordered, and personally interpreted labs.  The pertinent results include: Mildly elevated troponin.  Elevated liver enzymes more so than her baseline.  Bilirubin elevated.  Imaging Studies: I ordered imaging studies including CT head without contrast RUQ US , and chest x-ray.   I independently visualized and interpreted imaging which showed no acute abnormalities in any of the scans I agree with the radiologist interpretation.  Cardiac Monitoring: The patient was maintained on a cardiac monitor.  I personally viewed and interpreted the cardiac monitored which showed an underlying rhythm of: Bradycardia  ED Course / Critical Interventions: Pt appears weak laying in ED bed.  Patient has no obvious deficits on initial exam, no focal deficits on neuroexam or unilateral weaknesses in extremities.  Lungs are clear to auscultation patient denies shortness of breath or chest pain.  Very mild pitting edema to lower extremities, no other signs of fluid overload.  Patient has no obvious signs of injury and denies any pain to extremities or head.  Sepsis was considered but patient  does not fit SIRS criteria, PE was suspected due to decreased activity but vital signs do not support, ACS considered but the patient does not have any complaints of chest pain.  Upon reevaluation, patient is resting in ED bed comfortably.  Patient is still complaining of general weakness but denies any dizziness, chest pain, shortness of breath, nausea.  Liver enzymes were elevated with elevated bilirubin, right upper quadrant ultrasound was ordered to rule out cholecystitis.  All scans came back negative for acute abnormalities.  Patient is still appearing weak but has no complaints of pain. Admission consult will be placed for monitoring due to syncopal episode and weakness.  Patient was admitted to Dr. Lou and patient care was transferred.  Disposition: Considered admission and after reviewing the patient's encounter today, I feel that the patient would benefit from admission.  Discussed course of treatment with the patient, whom demonstrated understanding.  Patient in agreement and has no further questions.    I discussed this case with my attending, Dr. Darra, who agreed with the proposed treatment course and cosigned this note including patient's presenting symptoms, physical exam, and planned diagnostics and interventions.  Attending physician stated agreement with plan or made changes to plan which were implemented.     This chart was dictated using voice recognition software.  Despite best efforts to proofread, errors can occur which can change the documentation meaning.   Final diagnoses:  Syncope, unspecified syncope type  Generalized weakness  Elevated LFTs    ED Discharge Orders     None          Myriam [name] GORMAN DEVONNA 12/12/23 2322    Darra [name] MATSU, MD 12/16/23 [phone]

## 2023-12-12 NOTE — ED Triage Notes (Signed)
 Patient became dizzy, weak and fell today. This has happened the past 3 days. She had a UTI in June that family is concerned that it may not have resolved. During the fall she hit her head but is not on blood thinners. She complains of chronic back pain, peripheral neuropathy and hallucinations at times. A&O x4 per EMS.   EMS vitals: 120 palpated BP 62 HR 93% RA 182 CBG

## 2023-12-13 DIAGNOSIS — R7989 Other specified abnormal findings of blood chemistry: Secondary | ICD-10-CM

## 2023-12-13 DIAGNOSIS — R55 Syncope and collapse: Secondary | ICD-10-CM

## 2023-12-13 DIAGNOSIS — R531 Weakness: Secondary | ICD-10-CM | POA: Diagnosis not present

## 2023-12-13 LAB — BASIC METABOLIC PANEL WITH GFR
Anion gap: 17 — ABNORMAL HIGH (ref 5–15)
BUN: 21 mg/dL (ref 8–23)
CO2: 24 mmol/L (ref 22–32)
Calcium: 9.7 mg/dL (ref 8.9–10.3)
Chloride: 102 mmol/L (ref 98–111)
Creatinine, Ser: 0.96 mg/dL (ref 0.44–1.00)
GFR, Estimated: >60 mL/min (ref >60)
Glucose, Bld: 125 mg/dL — ABNORMAL HIGH (ref 70–99)
Potassium: 3.7 mmol/L (ref 3.5–5.1)
Sodium: 142 mmol/L (ref 135–145)

## 2023-12-13 LAB — HEPATIC FUNCTION PANEL
ALT: 374 U/L — ABNORMAL HIGH (ref 0–44)
AST: 385 U/L — ABNORMAL HIGH (ref 15–41)
Albumin: 3 g/dL — ABNORMAL LOW (ref 3.5–5.0)
Alkaline Phosphatase: 101 U/L (ref 38–126)
Bilirubin, Direct: 1.7 mg/dL — ABNORMAL HIGH (ref 0.0–0.2)
Indirect Bilirubin: 1.1 mg/dL — ABNORMAL HIGH (ref 0.3–0.9)
Total Bilirubin: 2.8 mg/dL — ABNORMAL HIGH (ref 0.0–1.2)
Total Protein: 4.7 g/dL — ABNORMAL LOW (ref 6.5–8.1)

## 2023-12-13 LAB — CBC
HCT: 46.1 % — ABNORMAL HIGH (ref 36.0–46.0)
Hemoglobin: 15.2 g/dL — ABNORMAL HIGH (ref 12.0–15.0)
MCH: 32 pg (ref 26.0–34.0)
MCHC: 33 g/dL (ref 30.0–36.0)
MCV: 97.1 fL (ref 80.0–100.0)
Platelets: 104 K/uL — ABNORMAL LOW (ref 150–400)
RBC: 4.75 MIL/uL (ref 3.87–5.11)
RDW: 14.9 % (ref 11.5–15.5)
WBC: 6.7 K/uL (ref 4.0–10.5)
nRBC: 0 % (ref 0.0–0.2)

## 2023-12-13 LAB — TROPONIN T, HIGH SENSITIVITY: Troponin T High Sensitivity: 40 ng/L — ABNORMAL HIGH (ref 0–19)

## 2023-12-13 LAB — VITAMIN D 25 HYDROXY (VIT D DEFICIENCY, FRACTURES): Vit D, 25-Hydroxy: 66.68 ng/mL (ref 30–100)

## 2023-12-13 LAB — GLUCOSE, CAPILLARY
Glucose-Capillary: 93 mg/dL (ref 70–99)
Glucose-Capillary: 141 mg/dL — ABNORMAL HIGH (ref 70–99)
Glucose-Capillary: 116 mg/dL — ABNORMAL HIGH (ref 70–99)

## 2023-12-13 LAB — MAGNESIUM: Magnesium: 1.7 mg/dL (ref 1.7–2.4)

## 2023-12-13 LAB — PHOSPHORUS: Phosphorus: 2.6 mg/dL (ref 2.5–4.6)

## 2023-12-13 MED ORDER — INSULIN ASPART 100 UNIT/ML IJ SOLN
0.0000 [IU] | Freq: Three times a day (TID) | INTRAMUSCULAR | Status: DC
  Administered 2023-12-13 – 2023-12-15 (×2): 2 [IU] via SUBCUTANEOUS
  Administered 2023-12-16: 3 [IU] via SUBCUTANEOUS
  Administered 2023-12-16: 2 [IU] via SUBCUTANEOUS
  Administered 2023-12-17 – 2023-12-18 (×2): 3 [IU] via SUBCUTANEOUS
  Administered 2023-12-18: 2 [IU] via SUBCUTANEOUS
  Administered 2023-12-20: 3 [IU] via SUBCUTANEOUS
  Administered 2023-12-21: 2 [IU] via SUBCUTANEOUS

## 2023-12-13 MED ORDER — PREGABALIN 50 MG PO CAPS
50.0000 mg | ORAL_CAPSULE | Freq: Three times a day (TID) | ORAL | Status: DC
  Administered 2023-12-13 – 2023-12-21 (×22): 50 mg via ORAL
  Filled 2023-12-13 (×22): qty 1

## 2023-12-13 MED ORDER — VITAMIN D 25 MCG (1000 UNIT) PO TABS
2000.0000 [IU] | ORAL_TABLET | Freq: Every day | ORAL | Status: DC
  Administered 2023-12-13 – 2023-12-21 (×9): 2000 [IU] via ORAL
  Filled 2023-12-13 (×11): qty 2

## 2023-12-13 MED ORDER — CEFADROXIL 500 MG PO CAPS
500.0000 mg | ORAL_CAPSULE | Freq: Two times a day (BID) | ORAL | Status: DC
  Administered 2023-12-13 – 2023-12-21 (×18): 500 mg via ORAL
  Filled 2023-12-13 (×18): qty 1

## 2023-12-13 MED ORDER — TRAZODONE HCL 50 MG PO TABS
50.0000 mg | ORAL_TABLET | Freq: Every evening | ORAL | Status: DC | PRN
  Administered 2023-12-13 – 2023-12-18 (×5): 50 mg via ORAL
  Filled 2023-12-13 (×6): qty 1

## 2023-12-13 MED ORDER — AMITRIPTYLINE HCL 25 MG PO TABS
25.0000 mg | ORAL_TABLET | Freq: Every day | ORAL | Status: DC
  Administered 2023-12-13 – 2023-12-20 (×9): 25 mg via ORAL
  Filled 2023-12-13 (×9): qty 1

## 2023-12-13 MED ORDER — PREGABALIN 50 MG PO CAPS: 50.0000 mg | ORAL_CAPSULE | Freq: Three times a day (TID) | ORAL | Status: DC

## 2023-12-13 MED ORDER — TIZANIDINE HCL 4 MG PO TABS: 4.0000 mg | ORAL_TABLET | Freq: Four times a day (QID) | ORAL | Status: DC | PRN

## 2023-12-13 MED ORDER — LEVOTHYROXINE SODIUM 75 MCG PO TABS
75.0000 ug | ORAL_TABLET | Freq: Every day | ORAL | Status: DC
  Administered 2023-12-13 – 2023-12-15 (×3): 75 ug via ORAL
  Filled 2023-12-13: qty 1
  Filled 2023-12-13 (×2): qty 3
  Filled 2023-12-13: qty 1

## 2023-12-13 MED ORDER — PANTOPRAZOLE SODIUM 40 MG PO TBEC
40.0000 mg | DELAYED_RELEASE_TABLET | Freq: Two times a day (BID) | ORAL | Status: DC
  Administered 2023-12-13 – 2023-12-21 (×17): 40 mg via ORAL
  Filled 2023-12-13 (×18): qty 1

## 2023-12-13 MED ORDER — ALLOPURINOL 300 MG PO TABS
300.0000 mg | ORAL_TABLET | Freq: Every day | ORAL | Status: DC
  Administered 2023-12-13 – 2023-12-20 (×9): 300 mg via ORAL
  Filled 2023-12-13 (×9): qty 1

## 2023-12-13 MED ORDER — SODIUM CHLORIDE 0.9 % IV SOLN: INTRAVENOUS | Status: AC

## 2023-12-13 MED ORDER — METHOCARBAMOL 500 MG PO TABS
500.0000 mg | ORAL_TABLET | Freq: Four times a day (QID) | ORAL | Status: DC | PRN
  Administered 2023-12-13 – 2023-12-20 (×5): 500 mg via ORAL
  Filled 2023-12-13 (×5): qty 1

## 2023-12-13 MED ORDER — MIDODRINE HCL 5 MG PO TABS
5.0000 mg | ORAL_TABLET | Freq: Two times a day (BID) | ORAL | Status: DC
  Administered 2023-12-13 (×2): 5 mg via ORAL
  Filled 2023-12-13 (×2): qty 1

## 2023-12-13 NOTE — Plan of Care (Signed)

## 2023-12-13 NOTE — Progress Notes (Signed)
 PROGRESS NOTE    SANAE WILLETTS  FMW:[id number] DOB: Oct 27, 1945 DOA: 12/12/2023 PCP: Bulah Alm RAMAN, PA-C    Brief Narrative:  78 year old with history of chronic diastolic heart failure, hypertension, hyperlipidemia, GERD, spinal fusion and chronic back pain and osteomyelitis on suppressive antibiotic therapy, anxiety and depression, recently diagnosed transaminitis under investigation presented to the ER with multiple fall, dizziness and lightheadedness, chronic back pain.  Describes episodes of blackout and fall.  In the emergency room hemodynamically stable.  Blood pressure stable.  Renal functions normal.  Bilirubin, AST and ALT elevated but comparable to outpatient.  EKG with bradycardia heart rate 48.  Chest x-ray normal.  CT head normal.  Right upper quadrant ultrasound with contracted gallbladder.  Orthostatic was not checked in the ER.  Subjective: Patient seen and examined in the morning rounds.  She was working with occupational therapist.  She had significant drop in systolic blood pressure with supine blood pressure 91/75--dropping to 56/34 on standing with tachycardia and nausea.  Significantly orthostatic. Assessment & Plan:   Multiple falls, severe orthostatic symptoms with history of orthostatic hypotension: Discontinue muscle relaxants.  Discontinue Lyrica  today. IV fluids 100 mL/h. TED hose. Midodrine  5 mg twice daily.  Will accept supine hypertension in order to have room for orthostatic drop. Extreme orthostatic precautions.  May need further medication adjustment.  Continue mobility with PT OT.  Transaminitis, abnormal LFT: Recently been worked up as outpatient.  Continue follow-up.  Chronic back pain, status post spinal fusion, chronic osteomyelitis of the L-spine: Patient on chronic suppressive therapy with cefadroxil .  Continue. Hypothyroidism: On Synthroid  we will continue. GERD: On PPI. Gout, on allopurinol .     DVT prophylaxis: Place TED hose Start:  12/13/23 1008 enoxaparin  (LOVENOX ) injection 40 mg Start: 12/13/23 1000   Code Status: Full code Family Communication: None at the bedside Disposition Plan: Status is: Observation The patient will require care spanning > 2 midnights and should be moved to inpatient because: IV fluids, significant orthostatic symptoms     Consultants:  None  Procedures:  None  Antimicrobials:  Cefadroxil , chronic suppressive therapy     Objective: Vitals:   12/12/23 2349 12/13/23 0000 12/13/23 0353 12/13/23 1110  BP: (!) 168/104  (!) 149/77 122/67  Pulse: 85  (!) 59 (!) 107  Resp: 18  17 18   Temp: 98 F (36.7 C)  98.3 F (36.8 C) 98.7 F (37.1 C)  TempSrc: Oral  Oral Oral  SpO2: 95%  96% 92%  Weight:  73.7 kg    Height:  5' 7.5 (1.715 m)      Intake/Output Summary (Last 24 hours) at 12/13/2023 1303 Last data filed at 12/12/2023 2159 Gross per 24 hour  Intake 500 ml  Output --  Net 500 ml   Filed Weights   12/13/23 0000  Weight: 73.7 kg    Examination:  General exam: Appears calm and comfortable.  Frail.  Appropriately anxious. Respiratory system: Clear to auscultation. Respiratory effort normal. Cardiovascular system: S1 & S2 heard, RRR. No JVD, murmurs, rubs, gallops or clicks.  Trace bilateral pedal edema. Gastrointestinal system: Soft.  Nontender.  Bowel sounds present. Central nervous system: Alert and oriented. No focal neurological deficits.     Data Reviewed: I have personally reviewed following labs and imaging studies  CBC: Recent Labs  Lab 12/12/23 1602 12/13/23 0147  WBC 7.1 6.7  HGB 13.9 15.2*  HCT 41.5 46.1*  MCV 96.1 97.1  PLT 117* 104*   Basic Metabolic Panel: Recent Labs  Lab  12/12/23 1602 12/13/23 0147  NA 140 142  K 3.5 3.7  CL 101 102  CO2 26 24  GLUCOSE 178* 125*  BUN 22 21  CREATININE 1.15* 0.96  CALCIUM  9.6 9.7  MG  --  1.7  PHOS  --  2.6   GFR: Estimated Creatinine Clearance: 48.7 mL/min (by C-G formula based on SCr of  0.96 mg/dL). Liver Function Tests: Recent Labs  Lab 12/12/23 1602 12/13/23 0617  AST 426* 385*  ALT 414* 374*  ALKPHOS 108 101  BILITOT 3.3* 2.8*  PROT 5.0* 4.7*  ALBUMIN  3.2* 3.0*   No results for input(s): LIPASE, AMYLASE in the last 168 hours. No results for input(s): AMMONIA in the last 168 hours. Coagulation Profile: No results for input(s): INR, PROTIME in the last 168 hours. Cardiac Enzymes: No results for input(s): CKTOTAL, CKMB, CKMBINDEX, TROPONINI in the last 168 hours. BNP (last 3 results) No results for input(s): PROBNP in the last 8760 hours. HbA1C: No results for input(s): HGBA1C in the last 72 hours. CBG: Recent Labs  Lab 12/13/23 0722 12/13/23 1151  GLUCAP 93 141*   Lipid Profile: No results for input(s): CHOL, HDL, LDLCALC, TRIG, CHOLHDL, LDLDIRECT in the last 72 hours. Thyroid  Function Tests: No results for input(s): TSH, T4TOTAL, FREET4, T3FREE, THYROIDAB in the last 72 hours. Anemia Panel: No results for input(s): VITAMINB12, FOLATE, FERRITIN, TIBC, IRON, RETICCTPCT in the last 72 hours. Sepsis Labs: No results for input(s): PROCALCITON, LATICACIDVEN in the last 168 hours.  No results found for this or any previous visit (from the past 240 hours).       Radiology Studies: US  Abdomen Limited RUQ (LIVER/GB) Result Date: 12/12/2023 CLINICAL DATA:  151470 RUQ abdominal pain 151470 EXAM: ULTRASOUND ABDOMEN LIMITED RIGHT UPPER QUADRANT COMPARISON:  None Available. FINDINGS: Gallbladder: Contracted gallbladder. No gallstones or wall thickening visualized. No sonographic Murphy sign noted by sonographer. Common bile duct: Diameter: 3 mm Liver: Limited evaluation of the left hepatic lobe. No focal lesion identified. Within normal limits in parenchymal echogenicity. Portal vein is patent on color Doppler imaging with normal direction of blood flow towards the liver. Other: None. IMPRESSION: 1.  Unremarkable right upper quadrant ultrasound. 2. Contracted gallbladder. Electronically Signed   By: Morgane  Naveau M.D.   On: 12/12/2023 19:44   CT Head Wo Contrast Result Date: 12/12/2023 CLINICAL DATA:  Dizziness and weakness with subsequent fall. EXAM: CT HEAD WITHOUT CONTRAST TECHNIQUE: Contiguous axial images were obtained from the base of the skull through the vertex without intravenous contrast. RADIATION DOSE REDUCTION: This exam was performed according to the departmental dose-optimization program which includes automated exposure control, adjustment of the mA and/or kV according to patient size and/or use of iterative reconstruction technique. COMPARISON:  July 23, 2022 FINDINGS: Brain: There is generalized cerebral atrophy with widening of the extra-axial spaces and ventricular dilatation. There are areas of decreased attenuation within the white matter tracts of the supratentorial brain, consistent with microvascular disease changes. Vascular: Mild to moderate severity bilateral cavernous carotid artery calcification is noted. Skull: Normal. Negative for fracture or focal lesion. Sinuses/Orbits: No acute finding. Other: None. IMPRESSION: 1. Generalized cerebral atrophy and microvascular disease changes of the supratentorial brain. 2. No acute intracranial abnormality. Electronically Signed   By: Suzen Dials M.D.   On: 12/12/2023 19:30   DG Chest 2 View Result Date: 12/12/2023 CLINICAL DATA:  Status post fall. EXAM: CHEST - 2 VIEW COMPARISON:  None Availa chest radiograph July 18, 2022.  Ble. FINDINGS: The heart size  and mediastinal contours are within normal limits. No suspicious pulmonary nodule or consolidation. The visualized skeletal structures are unremarkable. No acute fracture identified. Mild compression deformity of the thoracic spine, similar to prior. Surgical clips overlying the left chest wall from prior lumpectomy. IMPRESSION: No active cardiopulmonary disease.  No acute  osseous lesion. Electronically Signed   By: Megan  Zare M.D.   On: 12/12/2023 17:54        Scheduled Meds:  allopurinol   300 mg Oral QHS   amitriptyline   25 mg Oral QHS   cefadroxil   500 mg Oral BID   cholecalciferol   2,000 Units Oral Daily   enoxaparin  (LOVENOX ) injection  40 mg Subcutaneous Q24H   insulin  aspart  0-15 Units Subcutaneous TID WC   levothyroxine   75 mcg Oral Q0600   midodrine   5 mg Oral BID WC   pantoprazole   40 mg Oral BID   Continuous Infusions:  sodium chloride  100 mL/hr at 12/13/23 1025     LOS: 0 days    Time spent: 52 minutes    Renato Applebaum, MD [facility]

## 2023-12-13 NOTE — Progress Notes (Signed)
 Occupational Therapy Treatment Patient Details Name: Brandi Stephens MRN: [id number] DOB: Apr 25, 1945 Today's Date: 12/13/2023   History of present illness Brandi Stephens is a 78 y.o. female admitted 12/12/23 due to generalized weakness, dizziness, and multiple falls. PMH includes chronic diastolic heart failure, HTN, HLD, GERD, DDD s/p spinal fusion, chronic back pain, gout, hypothyroidism, breast cancer s/p lumpectomy, gastroparesis, anxiety and depression, osteomyelitis of the lumbar spine on suppressive antibiotics   OT comments  Pt seen for second time due to need for [facility], RN staff with other patients. PLEASE NOTE BP AS LISTED BELOW (also entered into vital flow sheets) min A for stand pivot transfers from recliner>BSC>recliner>bed. Pt with increased dizziness and nausea from recent evaluation. Pt min A for management of clothing after toileting - able to perform peri care in seated position, unable to maintain standing for prolonged period of time- required seated position. OT will continue to follow acutely. MD in room at end of session and aware of +orthostatic vitals.   Sitting BP upon OT entry: 91/75 (82) HR 126 Standing: 56/34 (41) HR 121 Sitting on BSC after transfer 77/43 (53) HR 146 Sitting in recliner after transfer from [facility] with nausea: 56/31 (40) HR 140 Supine: 111/81 (92) HR 127        If plan is discharge home, recommend the following:  A lot of help with walking and/or transfers;A little help with bathing/dressing/bathroom;Assist for transportation;Help with stairs or ramp for entrance   Equipment Recommendations  BSC/3in1    Recommendations for Other Services PT consult    Precautions / Restrictions Precautions Precautions: Fall Recall of Precautions/Restrictions: Intact Restrictions Weight Bearing Restrictions Per Provider Order: No       Mobility Bed Mobility Overal bed mobility: Needs Assistance Bed Mobility: Sit to Sidelying, Rolling Rolling: Contact  guard assist   Supine to sit: Mod assist, HOB elevated, Used rails   Sit to sidelying: Min assist (for BLE) General bed mobility comments: has to maintain back precautions due to chronic back pain    Transfers Overall transfer level: Needs assistance Equipment used: Rolling walker (2 wheels) Transfers: Bed to chair/wheelchair/BSC       Step pivot transfers: Min assist     General transfer comment: min A for boost and balance - very unsteady on feet, transfer x3 recliner>BSC>recliner>bed     Balance Overall balance assessment: Needs assistance Sitting-balance support: Single extremity supported, Feet supported Sitting balance-Leahy Scale: Fair Sitting balance - Comments: unchallenged, posterior preference initially Postural control: Posterior lean Standing balance support: Bilateral upper extremity supported, Reliant on assistive device for balance Standing balance-Leahy Scale: Poor                             ADL either performed or assessed with clinical judgement   ADL Overall ADL's : Needs assistance/impaired Eating/Feeding: Minimal assistance;Sitting Eating/Feeding Details (indicate cue type and reason): assist to cut up food due to tremor, noted shaking with cup>mouth, able to use fork and bring food to mouth. When OT entered the room she had spilled coffee and syrup on her from attempting to eat lying in the bed Grooming: Wash/dry hands;Set up;Bed level Grooming Details (indicate cue type and reason): warn wash cloth Upper Body Bathing: Minimal assistance Upper Body Bathing Details (indicate cue type and reason): for back Lower Body Bathing: Minimal assistance Lower Body Bathing Details (indicate cue type and reason): knees down Upper Body Dressing : Sitting;Moderate assistance Upper Body Dressing Details (indicate  cue type and reason): bra, and new gown Lower Body Dressing: Minimal assistance Lower Body Dressing Details (indicate cue type and reason):  managing underwear and pants before and after toileting Toilet Transfer: Minimal assistance;Stand-pivot;Rolling walker (2 wheels);BSC/3in1 Toilet Transfer Details (indicate cue type and reason): elevated BSC Toileting- Clothing Manipulation and Hygiene: Minimal assistance Toileting - Clothing Manipulation Details (indicate cue type and reason): able to perform peri care in sitting, min A to manage underwear and pants. underwear liner switched out     Functional mobility during ADLs: Minimal assistance;Rolling walker (2 wheels) (SPT only) General ADL Comments: dizziness, generalized weakness, decreased activity tolerance. new tremor reported in BUE    Extremity/Trunk Assessment Upper Extremity Assessment Upper Extremity Assessment: Generalized weakness (Pt reporting new tremor in BUE)   Lower Extremity Assessment Lower Extremity Assessment: Defer to PT evaluation   Cervical / Trunk Assessment Cervical / Trunk Assessment: Kyphotic    Vision Baseline Vision/History: 1 Wears glasses Additional Comments: Pt did not have glasses, asked her to request family to bring   Perception     Praxis     Communication Communication Communication: No apparent difficulties   Cognition Arousal: Alert Behavior During Therapy: WFL for tasks assessed/performed               OT - Cognition Comments: tangential                 Following commands: Intact        Cueing   Cueing Techniques: Verbal cues, Tactile cues  Exercises      Shoulder Instructions       General Comments orthostatic vitals taken during session see vitals flowsheets (NT to enter)    Pertinent Vitals/ Pain       Pain Assessment Pain Assessment: No/denies pain  Home Living Family/patient expects to be discharged to:: Private residence Living Arrangements: Spouse/significant other Available Help at Discharge: Family;Available 24 hours/day Type of Home: House Home Access: Stairs to enter [employer] of Steps: 4 Entrance Stairs-Rails: Right;Can reach both;Left Home Layout: One level     Bathroom Shower/Tub: IT trainer: Standard Bathroom Accessibility: Yes How Accessible: Accessible via walker Home Equipment: Rolling Walker (2 wheels);Rollator (4 wheels);Grab bars - tub/shower;Hand held shower head;Adaptive equipment Adaptive Equipment: Reacher        Prior Functioning/Environment              Frequency  Min 2X/week        Progress Toward Goals  OT Goals(current goals can now be found in the care plan section)  Progress towards OT goals: Progressing toward goals (impacted by BP)  Acute Rehab OT Goals Patient Stated Goal: get stronger/better OT Goal Formulation: With patient Time For Goal Achievement: 12/27/23 Potential to Achieve Goals: Good ADL Goals Pt Will Perform Eating: with modified independence;with adaptive utensils;sitting Pt Will Perform Grooming: sitting;with set-up Pt Will Perform Upper Body Dressing: with set-up;sitting Pt Will Perform Lower Body Dressing: with supervision;sit to/from stand Pt Will Transfer to Toilet: with supervision;ambulating Pt Will Perform Toileting - Clothing Manipulation and hygiene: with modified independence;sitting/lateral leans Additional ADL Goal #1: Pt will verbalize at least 3 energy conservation strategies for ADL  Plan      Co-evaluation                 AM-PAC OT 6 Clicks Daily Activity     Outcome Measure   Help from another person eating meals?: A Little Help from another person taking care of  personal grooming?: A Little Help from another person toileting, which includes using toliet, bedpan, or urinal?: A Little Help from another person bathing (including washing, rinsing, drying)?: A Little Help from another person to put on and taking off regular upper body clothing?: A Little Help from another person to put on and taking off regular lower body  clothing?: A Little 6 Click Score: 18    End of Session Equipment Utilized During Treatment: Gait belt;Rolling walker (2 wheels)  OT Visit Diagnosis: Unsteadiness on feet (R26.81);History of falling (Z91.81);Muscle weakness (generalized) (M62.81)   Activity Tolerance Treatment limited secondary to medical complications (Comment) (orthostatic hypotension)   Patient Left in bed;with call bell/phone within reach;with bed alarm set;Other (comment) (MD present)   Nurse Communication Mobility status;Precautions        Time: [id number] OT Time Calculation (min): 26 min  Charges: OT General Charges $OT Visit: 1 Visit OT Evaluation $OT Eval Moderate Complexity: 1 Mod OT Treatments $Self Care/Home Management : 23-37 mins  Leita DEL OTR/L Acute Rehabilitation Services Office: [phone]  Leita PARAS [facility] 12/13/2023, 10:45 AM

## 2023-12-13 NOTE — Evaluation (Signed)
 Occupational Therapy Evaluation Patient Details Name: Brandi Stephens MRN: [id number] DOB: 04/20/1945 Today's Date: 12/13/2023   History of Present Illness   Brandi Stephens is a 78 y.o. female admitted 12/12/23 due to generalized weakness, dizziness, and multiple falls. PMH includes chronic diastolic heart failure, HTN, HLD, GERD, DDD s/p spinal fusion, chronic back pain, gout, hypothyroidism, breast cancer s/p lumpectomy, gastroparesis, anxiety and depression, osteomyelitis of the lumbar spine on suppressive antibiotics     Clinical Impressions Pt is typically mod I for mobility with RW/Rollator. She sits for bathing and does her own dressing. Husband and daughter do all IADL, and she does not drive anymore. She wears glasses, which were not present during today's session. Today she was pleasant and willing to participate with therapy. Min to mod A for bed mobility (mod A to use pad to assist hips EOB), initial seated balance was poor, improving to fair with time and Bil feet on the ground. PT endorses dizziness with positional changes but no nystagmus noted. Pt min A for pivot to recliner, able to stand for full 3 min of orthostatic with encouragement. Once in a supported seated position in recliner Pt better able to participate with ADL tasks, grooming and self-feeding. Pt reporting new tremor in BUE - OT had to cut up food but patient able to self-feed with fork. Pt had spilled coffee and syrup on her trying to self-feed at bed level with HOB elevated. OT initiated education about options if tremor persists with weighted utensils and cups with lids etc. None provided this session as with improved positioning and set up she was able to self-feeding. OT will continue to follow acutely and recommending HHOT post-acute to maximize safety and independence in ADL and functional transfers - but will monitor for improved mobility (limited to SPT This session)     If plan is discharge home,  recommend the following:   A lot of help with walking and/or transfers;A little help with bathing/dressing/bathroom;Assist for transportation;Help with stairs or ramp for entrance     Functional Status Assessment   Patient has had a recent decline in their functional status and demonstrates the ability to make significant improvements in function in a reasonable and predictable amount of time.     Equipment Recommendations   BSC/3in1     Recommendations for Other Services   PT consult     Precautions/Restrictions   Precautions Precautions: Fall Recall of Precautions/Restrictions: Intact Restrictions Weight Bearing Restrictions Per Provider Order: No     Mobility Bed Mobility Overal bed mobility: Needs Assistance Bed Mobility: Supine to Sit     Supine to sit: Mod assist, HOB elevated, Used rails     General bed mobility comments: Pt with good initiation, ultimately needing min help with trunk elevation, and mod A to bring hips EOB    Transfers Overall transfer level: Needs assistance Equipment used: Rolling walker (2 wheels) Transfers: Bed to chair/wheelchair/BSC       Step pivot transfers: Min assist     General transfer comment: min A for boost and balance - very unsteady on feet      Balance Overall balance assessment: Needs assistance Sitting-balance support: Single extremity supported, Feet supported Sitting balance-Leahy Scale: Fair Sitting balance - Comments: unchallenged, posterior preference initially Postural control: Posterior lean Standing balance support: Bilateral upper extremity supported, Reliant on assistive device for balance Standing balance-Leahy Scale: Poor  ADL either performed or assessed with clinical judgement   ADL Overall ADL's : Needs assistance/impaired Eating/Feeding: Minimal assistance;Sitting Eating/Feeding Details (indicate cue type and reason): assist to cut up food due to  tremor, noted shaking with cup>mouth, able to use fork and bring food to mouth. When OT entered the room she had spilled coffee and syrup on her from attempting to eat lying in the bed Grooming: Wash/dry face;Brushing hair;Minimal assistance;Sitting   Upper Body Bathing: Minimal assistance Upper Body Bathing Details (indicate cue type and reason): for back Lower Body Bathing: Minimal assistance Lower Body Bathing Details (indicate cue type and reason): knees down Upper Body Dressing : Sitting;Moderate assistance Upper Body Dressing Details (indicate cue type and reason): bra, and new gown Lower Body Dressing: Minimal assistance   Toilet Transfer: Minimal assistance;Stand-pivot;Rolling walker (2 wheels) Toilet Transfer Details (indicate cue type and reason): simulated through recliner transfer Toileting- Clothing Manipulation and Hygiene: Minimal assistance;Sit to/from stand       Functional mobility during ADLs: Minimal assistance;Rolling walker (2 wheels) (SPT only) General ADL Comments: dizziness, generalized weakness, decreased activity tolerance. new tremor reported in BUE     Vision Baseline Vision/History: 1 Wears glasses Additional Comments: Pt did not have glasses, asked her to request family to bring     Perception         Praxis         Pertinent Vitals/Pain Pain Assessment Pain Assessment: No/denies pain     Extremity/Trunk Assessment Upper Extremity Assessment Upper Extremity Assessment: Generalized weakness (Pt reports new tremor impacting self-feeding and other BUE tasks)   Lower Extremity Assessment Lower Extremity Assessment: Defer to PT evaluation   Cervical / Trunk Assessment Cervical / Trunk Assessment: Kyphotic   Communication Communication Communication: No apparent difficulties   Cognition Arousal: Alert Behavior During Therapy: WFL for tasks assessed/performed               OT - Cognition Comments: tangential                  Following commands: Intact       Cueing  General Comments   Cueing Techniques: Verbal cues;Tactile cues  orthostatic vitals taken during session see vitals flowsheets   Exercises     Shoulder Instructions      Home Living Family/patient expects to be discharged to:: Private residence Living Arrangements: Spouse/significant other Available Help at Discharge: Family;Available 24 hours/day Type of Home: House Home Access: Stairs to enter [employer] of Steps: 4 Entrance Stairs-Rails: Right;Can reach both;Left Home Layout: One level     Bathroom Shower/Tub: IT trainer: Standard Bathroom Accessibility: Yes How Accessible: Accessible via walker Home Equipment: Rolling Walker (2 wheels);Rollator (4 wheels);Grab bars - tub/shower;Hand held shower head;Adaptive equipment Adaptive Equipment: Reacher        Prior Functioning/Environment               Mobility Comments: RW at baseline ADLs Comments: husband cooks and cleaning, does all own bathing/dressing. also gets help from daughter [name]    OT Problem List: Decreased strength;Decreased activity tolerance;Impaired balance (sitting and/or standing);Decreased safety awareness;Impaired UE functional use   OT Treatment/Interventions: Self-care/ADL training;Energy conservation;DME and/or AE instruction;Therapeutic activities;Patient/family education;Balance training      OT Goals(Current goals can be found in the care plan section)   Acute Rehab OT Goals Patient Stated Goal: get stronger again OT Goal Formulation: With patient Time For Goal Achievement: 12/27/23 Potential to Achieve Goals: Good ADL Goals Pt Will Perform  Eating: with modified independence;with adaptive utensils;sitting Pt Will Perform Grooming: sitting;with set-up Pt Will Perform Upper Body Dressing: with set-up;sitting Pt Will Perform Lower Body Dressing: with supervision;sit to/from stand Pt Will  Transfer to Toilet: with supervision;ambulating Pt Will Perform Toileting - Clothing Manipulation and hygiene: with modified independence;sitting/lateral leans Additional ADL Goal #1: Pt will verbalize at least 3 energy conservation strategies for ADL   OT Frequency:  Min 2X/week    Co-evaluation              AM-PAC OT 6 Clicks Daily Activity     Outcome Measure Help from another person eating meals?: A Little Help from another person taking care of personal grooming?: A Little Help from another person toileting, which includes using toliet, bedpan, or urinal?: A Little Help from another person bathing (including washing, rinsing, drying)?: A Little Help from another person to put on and taking off regular upper body clothing?: A Little Help from another person to put on and taking off regular lower body clothing?: A Little 6 Click Score: 18   End of Session Equipment Utilized During Treatment: Gait belt;Rolling walker (2 wheels)  Activity Tolerance: Patient tolerated treatment well Patient left: in chair;with call bell/phone within reach;with chair alarm set  OT Visit Diagnosis: Unsteadiness on feet (R26.81);History of falling (Z91.81);Muscle weakness (generalized) (M62.81)                Time: 9156-9080 OT Time Calculation (min): 36 min Charges:  OT General Charges $OT Visit: 1 Visit OT Evaluation $OT Eval Moderate Complexity: 1 Mod OT Treatments $Self Care/Home Management : 8-22 mins  Leita DEL OTR/L Acute Rehabilitation Services Office: [phone]  Leita PARAS [facility] 12/13/2023, 10:20 AM

## 2023-12-13 NOTE — Evaluation (Signed)
 Physical Therapy Evaluation Patient Details Name: Brandi Stephens MRN: [id number] DOB: 09/19/1945 Today's Date: 12/13/2023  History of Present Illness  78 y.o. female admitted 12/12/23 due to generalized weakness, dizziness, and multiple falls. PMH includes chronic diastolic heart failure, HTN, HLD, GERD, DDD s/p spinal fusion, chronic back pain, gout, hypothyroidism, breast cancer s/p lumpectomy, gastroparesis, anxiety and depression, osteomyelitis of the lumbar spine on suppressive antibiotics  Clinical Impression  On eval, pt required Mod A for supine to sit and CGA-Min A for standing, lateral steps along bedside. Pt c/o some dizziness with sidelying to sit but no further dizziness with standing, lateral steps. She did report moderate back pain and fatigue during session this afternoon. Deferred further mobility and assisted pt back to bed. Will plan to follow and progress activity as tolerated.        If plan is discharge home, recommend the following: A little help with walking and/or transfers;A little help with bathing/dressing/bathroom;Assistance with cooking/housework;Assist for transportation;Help with stairs or ramp for entrance   Can travel by private vehicle        Equipment Recommendations None recommended by PT  Recommendations for Other Services  OT consult    Functional Status Assessment Patient has had a recent decline in their functional status and demonstrates the ability to make significant improvements in function in a reasonable and predictable amount of time.     Precautions / Restrictions Precautions Precautions: Fall Recall of Precautions/Restrictions: Intact Restrictions Weight Bearing Restrictions Per Provider Order: No      Mobility  Bed Mobility Overal bed mobility: Needs Assistance Bed Mobility: Sidelying to Sit, Rolling, Sit to Sidelying Rolling: Supervision Sidelying to sit: Mod assist, HOB elevated, Used rails     Sit to sidelying: Contact  guard assist, Used rails General bed mobility comments: has to maintain back precautions due to chronic back pain. increased time and difficulty for pt to get upright and to EOB. c/o some dizziness during task    Transfers Overall transfer level: Needs assistance Equipment used: Rolling walker (2 wheels) Transfers: Sit to/from Stand Sit to Stand: Min assist, From elevated surface           General transfer comment: Assist to power up, steady, control descent. Cues for safety, technique, hand placement.    Ambulation/Gait Ambulation/Gait assistance: Contact guard assist   Assistive device: Rolling walker (2 wheels)         General Gait Details: side steps along bedside with RW. Pt denied dizziness.  Stairs            Wheelchair Mobility     Tilt Bed    Modified Rankin (Stroke Patients Only)       Balance Overall balance assessment: Needs assistance         Standing balance support: Bilateral upper extremity supported, Reliant on assistive device for balance Standing balance-Leahy Scale: Poor                               Pertinent Vitals/Pain Pain Assessment Pain Assessment: Faces Faces Pain Scale: Hurts even more Pain Location: back with mobility Pain Descriptors / Indicators: Sharp, Aching Pain Intervention(s): Limited activity within patient's tolerance, Monitored during session, Repositioned    Home Living Family/patient expects to be discharged to:: Private residence Living Arrangements: Spouse/significant other Available Help at Discharge: Family;Available 24 hours/day Type of Home: House Home Access: Stairs to enter Entrance Stairs-Rails: Right;Can reach both;Left Entrance Stairs-Number of Steps: 4  Home Layout: One level Home Equipment: Agricultural consultant (2 wheels);Rollator (4 wheels);Grab bars - tub/shower;Hand held shower head;Adaptive equipment      Prior Function Prior Level of Function : Independent/Modified  Independent             Mobility Comments: RW at baseline ADLs Comments: husband cooks and cleaning, does all own bathing/dressing. also gets help from daughter [name]     Extremity/Trunk Assessment   Upper Extremity Assessment Upper Extremity Assessment: Defer to OT evaluation    Lower Extremity Assessment Lower Extremity Assessment: Generalized weakness    Cervical / Trunk Assessment Cervical / Trunk Assessment: Kyphotic  Communication   Communication Communication: No apparent difficulties    Cognition Arousal: Alert Behavior During Therapy: WFL for tasks assessed/performed                             Following commands: Intact       Cueing Cueing Techniques: Verbal cues     General Comments      Exercises     Assessment/Plan    PT Assessment Patient needs continued PT services  PT Problem List Decreased strength;Decreased range of motion;Decreased activity tolerance;Decreased mobility;Decreased balance;Pain;Decreased knowledge of use of DME       PT Treatment Interventions DME instruction;Gait training;Functional mobility training;Therapeutic activities;Therapeutic exercise;Patient/family education;Balance training    PT Goals (Current goals can be found in the Care Plan section)  Acute Rehab PT Goals Patient Stated Goal: feel better. home and resume HHPT. PT Goal Formulation: With patient Time For Goal Achievement: 12/27/23 Potential to Achieve Goals: Good    Frequency Min 3X/week     Co-evaluation               AM-PAC PT 6 Clicks Mobility  Outcome Measure Help needed turning from your back to your side while in a flat bed without using bedrails?: A Little Help needed moving from lying on your back to sitting on the side of a flat bed without using bedrails?: A Lot Help needed moving to and from a bed to a chair (including a wheelchair)?: A Little Help needed standing up from a chair using your arms (e.g., wheelchair or  bedside chair)?: A Little Help needed to walk in hospital room?: A Little Help needed climbing 3-5 steps with a railing? : A Lot 6 Click Score: 16    End of Session Equipment Utilized During Treatment: Gait belt Activity Tolerance: Patient limited by fatigue;Patient limited by pain Patient left: in bed;with call bell/phone within reach;with bed alarm set   PT Visit Diagnosis: Muscle weakness (generalized) (M62.81);Difficulty in walking, not elsewhere classified (R26.2)    Time: 8391-8362 PT Time Calculation (min) (ACUTE ONLY): 29 min   Charges:   PT Evaluation $PT Eval Low Complexity: 1 Low PT Treatments $Therapeutic Activity: 8-22 mins PT General Charges $$ ACUTE PT VISIT: 1 Visit           Dannial SQUIBB, PT Acute Rehabilitation  Office: [phone]

## 2023-12-13 NOTE — H&P (Signed)
 History and Physical  Brandi Stephens FMW:[id number] DOB: Aug 02, 1945 DOA: 12/12/2023  PCP: Bulah Alm RAMAN, PA-C   Chief Complaint: Generalized weakness, falls, dizziness  HPI: Brandi Stephens is a 78 y.o. female with medical history significant for chronic diastolic heart failure, HTN, HLD, GERD, DDD s/p spinal fusion, chronic back pain, gout, hypothyroidism, breast cancer s/p lumpectomy, gastroparesis, anxiety and depression, osteomyelitis of the lumbar spine on suppressive antibiotics, T2DM and transaminitis who presented to the ED for evaluation of generalized weakness, multiple falls and dizziness.  Patient reports she has been dealing with chronic back pain however over the last 3 days, she has had worsening generalized weakness and intermittent falls due to the weakness.  Over this timeframe, she also reports dizziness described as lightheadedness, loss of body control and fainting.  At baseline, she uses a walker to ambulate but has had trouble getting up after falling to the ground.  She presented to the ED for evaluation after another fall today.  She denies any vision changes, headache, chest pain, shortness of breath, nausea, vomiting, abdominal pain, dysuria, fevers or chills.  ED Course: Initial vitals show temp 97.4, RR 10-18, HR 48-63, SBP 110s to 140s, SpO2 96% on room air. Initial labs significant for sodium 140, glucose 178, creatinine 1.15, AST/ALT 426/414, bilirubin 3.3, albumin  3.2, troponin 35, WBC 7.1, Hgb 13.9, platelet 117, UA with no signs of infection. EKG shows sinus bradycardia with rate of 48. CXR shows no active disease.  CT head with no acute intracranial abnormality. RUQ U/S shows contracted gallbladder but no gallstones or wall thickening.  Pt received IV NS 500 cc bolus. TRH was consulted for admission.   Review of Systems: Please see HPI for pertinent positives and negatives. A complete 10 system review of systems are otherwise negative.  Past Medical History:   Diagnosis Date   AIN (anal intraepithelial neoplasia) anal canal    Anemia    with pregnancy   Anxiety    Arthritis    knees, lower back, knees   Chronic constipation    Chronic diastolic CHF (congestive heart failure) (HCC) 07/05/2019   CKD (chronic kidney disease)    Coarse tremors    Essential   COPD (chronic obstructive pulmonary disease) (HCC)    2017 chest CT, history of   Degenerative lumbar spinal stenosis    Depression    Depression 07/05/2019   Drug dependency (HCC)    Elevated PTHrP level 05/09/2019   EtOH use 07/05/2019   Pt denies heavy drinking however   Gastroparesis    GERD (gastroesophageal reflux disease)    pt denies   Gout    Grade I diastolic dysfunction 02/01/2016   Noted ECHO   Hemorrhoids    History of adenomatous polyp of colon    History of basal cell carcinoma (BCC) excision 2015   nose   History of sepsis 05/14/2014   post lumbar surgery   History of shingles    x2   Hyperlipidemia    Hypertension    Hypothyroidism    Irregular heart beat    history of while undergoing radiation   Malignant neoplasm of upper-inner quadrant of left breast in female, estrogen receptor positive [facility]) oncologist-  dr layla--  per lov in epic no recurrence   dx 07-13-2015  Left breast invasive ductal carcinoma, Stage IA, Grade 1 (TXN0),  09-16-2015  s/p  left breast lumpectomy with sln bx's,  completed radiation 11-18-2015,  started antiestrogen therapy 12-14-2015   Neuropathy  Left foot and back of left leg   Obesity (BMI 30-39.9) 07/05/2019   Personal history of radiation therapy    completed 11-18-2015  left breast   Pneumonia    PONV (postoperative nausea and vomiting)    Prediabetes    diet controlled, no med   Restless leg syndrome    Seasonal allergies    Seizure (HCC) 05/2015   due to sepsis, only one time episode   Tremor    Wears partial dentures    lower    Past Surgical History:  Procedure Laterality Date   ABDOMINAL HYSTERECTOMY   1985   BASAL CELL CARCINOMA EXCISION  10/15   nose   BIOPSY  07/22/2022   Procedure: BIOPSY;  Surgeon: Aneita Gwendlyn DASEN, MD;  Location: [facility];  Service: Gastroenterology;;   BREAST BIOPSY Right 08/24/2015   BREAST BIOPSY Right 07/13/2015   BREAST BIOPSY Left 07/13/2015   BREAST EXCISIONAL BIOPSY Left 1972   BREAST LUMPECTOMY Left    BREAST LUMPECTOMY WITH RADIOACTIVE SEED AND SENTINEL LYMPH NODE BIOPSY Left 09/16/2015   Procedure: BREAST LUMPECTOMY WITH RADIOACTIVE SEED AND SENTINEL LYMPH NODE BIOPSY;  Surgeon: Jina Nephew, MD;  Location: [facility];  Service: General;  Laterality: Left;   BREAST REDUCTION SURGERY Bilateral 1998   CATARACT EXTRACTION W/ INTRAOCULAR LENS IMPLANT Left 2016   CERVICAL SPINE SURGERY  1995   hallo   COLONOSCOPY  01/2013   polyps   ESOPHAGOGASTRODUODENOSCOPY N/A 07/22/2022   Procedure: ESOPHAGOGASTRODUODENOSCOPY (EGD);  Surgeon: Aneita Gwendlyn DASEN, MD;  Location: [facility];  Service: Gastroenterology;  Laterality: N/A;   EYE SURGERY Left 2016   HAND SURGERY Left 02-19-2002   dr murrell  @[facility]    repair collateral ligament/ MPJ left thumb   HARDWARE REMOVAL N/A 07/24/2019   Procedure: REVISION OF HARDWARE Lumbar two - Sacral one. Extention of Fusion to Lumbar one;  Surgeon: Lanis Pupa, MD;  Location: [facility] OR;  Service: Neurosurgery;  Laterality: N/A;   HEMORRHOID SURGERY  2008   HIGH RESOLUTION ANOSCOPY N/A 02/28/2018   Procedure: HIGH RESOLUTION ANOSCOPY WITH BIOPSY;  Surgeon: Debby Hila, MD;  Location: [facility];  Service: General;  Laterality: N/A;   KNEE ARTHROSCOPY Right 2002   LUMBAR SPINE SURGERY  03-20-2015   dr lanis   fusion L4-5, L5-S1   MOUTH SURGERY  07/14/2018   2 infected teeth removed   MULTIPLE TOOTH EXTRACTIONS     with bone grafting lower bottom right and left   REDUCTION MAMMAPLASTY Bilateral    RIGHT COLECTOMY  09-10-2003   dr merrilyn @[facility]    multiple colon polyps (per path tubular  adenoma's, hyperplastic , benign appendix, benign two lymph nodes   ROTATOR CUFF REPAIR Left 04/2016   TONSILLECTOMY  1969   TUBAL LIGATION Bilateral 1982   Social History:  reports that she quit smoking about 5 years ago. Her smoking use included cigarettes. She started smoking about 40 years ago. She has a 17.5 pack-year smoking history. She has never used smokeless tobacco. She reports current alcohol  use. She reports that she does not currently use drugs.  Allergies  Allergen Reactions   Ampicillin  Hives and Other (See Comments)    Severe reaction in February 2017 1.5 month to have hives to go away   Gabapentin  Swelling    UNSPECIFIED REACTION    Penicillins Hives, Other (See Comments) and Rash    Severe reaction in February 2017 1.5 month to have hives to go away  Has patient  had a PCN reaction causing immediate rash, facial/tongue/throat swelling, SOB or lightheadedness with hypotension: #  #  #  YES  #  #  #   Has patient had a PCN reaction causing severe rash involving mucus membranes or skin necrosis: No  Has patient had a PCN reaction that required hospitalization No  Has patient had a PCN reaction occurring within the last 10 years: No   Percocet [Oxycodone -Acetaminophen ] Hives and Itching   Crestor [Rosuvastatin]     Mental fog, myalgia    Hydrocodone  Itching    Can tolerate with benadryl    Ozempic  (0.25 Or 0.5 Mg-Dose) [Semaglutide (0.25 Or 0.5mg -Dos)] Nausea Only   Other Itching    UNSPECIFIED Analgesics    Family History  Problem Relation Age of Onset   Atrial fibrillation Mother    Ulcers Father    Breast cancer Maternal Aunt    Lung cancer Maternal Uncle    Stomach cancer Maternal Grandmother    Lung cancer Maternal Aunt    Brain cancer Maternal Aunt    Prostate cancer Maternal Grandfather    Stroke Paternal Grandmother    Tuberculosis Paternal Grandfather    Colon cancer Neg Hx    Esophageal cancer Neg Hx    Rectal cancer Neg Hx      Prior to  Admission medications   Medication Sig Start Date End Date Taking? Authorizing Provider  allopurinol  (ZYLOPRIM ) 300 MG tablet Take 1 tablet (300 mg total) by mouth daily. 10/17/23  Yes Tysinger, Alm RAMAN, PA-C  amitriptyline  (ELAVIL ) 25 MG tablet Take 25 mg by mouth at bedtime. 12/11/23  Yes [provider]  Biotin  3 MG TABS Take 3 mg by mouth daily.   Yes [provider]  cefadroxil  (DURICEF) 500 MG capsule Take 1 capsule (500 mg total) by mouth 2 (two) times daily. 01/02/23  Yes Luiz Channel, MD  Cholecalciferol  (VITAMIN D3) 50 MCG (2000 UT) capsule Take 1 capsule (2,000 Units total) by mouth daily. 10/03/22  Yes Tysinger, Alm RAMAN, PA-C  colchicine  0.6 MG tablet TAKE 2 TABLETS BY MOUTH ONCE, THEN 1 TABLET AN HOUR LATER, THEN 1 TABLET TWICE DAILY FOR 3 DAYS Patient taking differently: Take 0.6 mg by mouth as needed (Gout). 10/17/23  Yes Tysinger, Alm RAMAN, PA-C  cyanocobalamin  1000 MCG tablet Take 1,000 mcg by mouth daily.   Yes [provider]  furosemide  (LASIX ) 20 MG tablet Take 1 tablet (20 mg total) by mouth daily. 12/08/23  Yes Tysinger, Alm RAMAN, PA-C  ibuprofen  (ADVIL ) 200 MG tablet Take 200 mg by mouth every 6 (six) hours as needed for mild pain (pain score 1-3).   Yes [provider]  levothyroxine  (SYNTHROID ) 75 MCG tablet Take 1 tablet (75 mcg total) by mouth daily. 07/11/23  Yes Tysinger, Alm RAMAN, PA-C  pantoprazole  (PROTONIX ) 40 MG tablet Take 1 tablet (40 mg total) by mouth 2 (two) times daily. 07/23/22 04/06/24 Yes Odell Celinda Balo, MD  polyethylene glycol powder (GLYCOLAX /MIRALAX ) 17 GM/SCOOP powder Take 17 g by mouth daily as needed for moderate constipation.   Yes [provider]  pregabalin  (LYRICA ) 50 MG capsule Take 1 capsule (50 mg total) by mouth 3 (three) times daily. 08/23/23  Yes Raulkar, Sven SQUIBB, MD  Propylene Glycol (SYSTANE BALANCE OP) Place 1 drop into both eyes 4 (four) times daily as needed (dry eyes).   Yes [provider]  tiZANidine  (ZANAFLEX ) 4 MG tablet Take 1 tablet (4 mg total) by mouth every 6 (six) hours as needed  for muscle spasms. 07/06/23  Yes Raulkar, Sven SQUIBB, MD  traZODone  (DESYREL ) 50 MG tablet Take 1 tablet (50 mg total) by mouth at bedtime. Patient taking differently: Take 50 mg by mouth at bedtime as needed for sleep. 08/23/23  Yes Raulkar, Sven SQUIBB, MD  lidocaine  (HM LIDOCAINE  PATCH) 4 % Place 1 patch onto the skin daily. Patient not taking: No sig reported    [provider]  midodrine  (PROAMATINE ) 5 MG tablet Take 1 tablet (5 mg total) by mouth 2 (two) times daily with a meal. Patient not taking: No sig reported 11/02/23   Tysinger, Alm RAMAN, PA-C  oxyCODONE  (OXY IR/ROXICODONE ) 5 MG immediate release tablet Take 5 mg by mouth 3 (three) times daily as needed. Patient not taking: Reported on 12/12/2023 09/29/23   [provider]    Physical Exam: BP (!) 168/104 (BP Location: Right Arm)   Pulse 85   Temp 98 F (36.7 C) (Oral)   Resp 18   SpO2 95%  General: Pleasant, weak appearing elderly woman laying in bed. No acute distress. HEENT: [place]/AT. Anicteric sclera CV: RRR. No murmurs, rubs, or gallops. No LE edema Pulmonary: Lungs CTAB. Normal effort. No wheezing or rales. Abdominal: Soft, nontender, nondistended. Normal bowel sounds. Extremities: Palpable radial and DP pulses. Normal ROM. Skin: Warm and dry. No obvious rash or lesions. Neuro: A&Ox3. Generalized weakness.  Moves all extremities. Normal sensation to light touch. No focal deficit. Psych: Normal mood and affect          Labs on Admission:  Basic Metabolic Panel: Recent Labs  Lab 12/12/23 1602  NA 140  K 3.5  CL 101  CO2 26  GLUCOSE 178*  BUN 22  CREATININE 1.15*  CALCIUM  9.6   Liver Function Tests: Recent Labs  Lab 12/12/23 1602  AST 426*  ALT 414*  ALKPHOS 108  BILITOT 3.3*  PROT 5.0*  ALBUMIN  3.2*   No results for input(s): LIPASE, AMYLASE in the last 168 hours. No results for  input(s): AMMONIA in the last 168 hours. CBC: Recent Labs  Lab 12/12/23 1602 12/13/23 0147  WBC 7.1 6.7  HGB 13.9 15.2*  HCT 41.5 46.1*  MCV 96.1 97.1  PLT 117* 104*   Cardiac Enzymes: No results for input(s): CKTOTAL, CKMB, CKMBINDEX, TROPONINI in the last 168 hours. BNP (last 3 results) No results for input(s): BNP in the last 8760 hours.  ProBNP (last 3 results) No results for input(s): PROBNP in the last 8760 hours.  CBG: No results for input(s): GLUCAP in the last 168 hours.  Radiological Exams on Admission: US  Abdomen Limited RUQ (LIVER/GB) Result Date: 12/12/2023 CLINICAL DATA:  151470 RUQ abdominal pain 151470 EXAM: ULTRASOUND ABDOMEN LIMITED RIGHT UPPER QUADRANT COMPARISON:  None Available. FINDINGS: Gallbladder: Contracted gallbladder. No gallstones or wall thickening visualized. No sonographic Murphy sign noted by sonographer. Common bile duct: Diameter: 3 mm Liver: Limited evaluation of the left hepatic lobe. No focal lesion identified. Within normal limits in parenchymal echogenicity. Portal vein is patent on color Doppler imaging with normal direction of blood flow towards the liver. Other: None. IMPRESSION: 1. Unremarkable right upper quadrant ultrasound. 2. Contracted gallbladder. Electronically Signed   By: Morgane  Naveau M.D.   On: 12/12/2023 19:44   CT Head Wo Contrast Result Date: 12/12/2023 CLINICAL DATA:  Dizziness and weakness with subsequent fall. EXAM: CT HEAD WITHOUT CONTRAST TECHNIQUE: Contiguous axial images were obtained from the base of the skull through the vertex without intravenous contrast. RADIATION DOSE REDUCTION: This exam  was performed according to the departmental dose-optimization program which includes automated exposure control, adjustment of the mA and/or kV according to patient size and/or use of iterative reconstruction technique. COMPARISON:  July 23, 2022 FINDINGS: Brain: There is generalized cerebral atrophy with widening of  the extra-axial spaces and ventricular dilatation. There are areas of decreased attenuation within the white matter tracts of the supratentorial brain, consistent with microvascular disease changes. Vascular: Mild to moderate severity bilateral cavernous carotid artery calcification is noted. Skull: Normal. Negative for fracture or focal lesion. Sinuses/Orbits: No acute finding. Other: None. IMPRESSION: 1. Generalized cerebral atrophy and microvascular disease changes of the supratentorial brain. 2. No acute intracranial abnormality. Electronically Signed   By: Suzen Dials M.D.   On: 12/12/2023 19:30   DG Chest 2 View Result Date: 12/12/2023 CLINICAL DATA:  Status post fall. EXAM: CHEST - 2 VIEW COMPARISON:  None Availa chest radiograph July 18, 2022.  Ble. FINDINGS: The heart size and mediastinal contours are within normal limits. No suspicious pulmonary nodule or consolidation. The visualized skeletal structures are unremarkable. No acute fracture identified. Mild compression deformity of the thoracic spine, similar to prior. Surgical clips overlying the left chest wall from prior lumpectomy. IMPRESSION: No active cardiopulmonary disease.  No acute osseous lesion. Electronically Signed   By: Megan  Zare M.D.   On: 12/12/2023 17:54   Assessment/Plan Gabrianna W Shepperson is a 78 y.o. female with medical history significant for chronic diastolic heart failure, HTN, HLD, GERD, DDD s/p spinal fusion, chronic back pain, gout, hypothyroidism, breast cancer s/p lumpectomy, gastroparesis, anxiety and depression, osteomyelitis of the lumbar spine on suppressive antibiotics, T2DM and transaminitis who presented to the ED for evaluation of generalized weakness, multiple falls and dizziness and admitted for generalized weakness.  # Generalized weakness # Recurrent falls - Elderly patient with chronic back pain presenting with generalized weakness and recurrent falls - CT head and CXR with no acute abnormalities,  no anemia or electrolyte abnormalities - Presentation likely multifactorial from high risk medications such as Lyrica  and tizanidine  in this elderly patient combined with her chronic back pain - Hold Lyrica  and tizanidine  (replace with as needed Robaxin  which has less sedating side effects) - PT/OT eval and treat - Follow-up vitamin D  levels - Fall precaution  # Dizziness - Patient also reports 3 days of intermittent dizziness as described as lightheadedness and fainting spells - EKG did show bradycardia however heart rate has been stable in the mid 50s to 80s since admission - Concern for possible medication-induced in the setting of tizanidine  and Lyrica  use - Patient euvolemic on exam, BP stable with SBP in the 110s to 140s - Check orthostatic vitals, morning cortisol and electrolytes - Place on cardiac monitoring  # Transaminitis - Patient has had persistently elevated LFTs currently being worked up in the outpatient by GI - Recent workup shows normal acute hepatitis labs, ANA, IgG, and anti-smooth muscle antibody - RUQ U/S on admission shows contracted gallbladder but no gallstones or wall thickening - Trend LFTs - Would likely need liver biopsy in the outpatient with GI for further evaluation  # T2DM - No recent A1c on file, blood sugar of 178 on CMP - SSI with meals, CBG monitoring - Follow-up repeat A1c  # Chronic back pain # DDD s/p spinal fusion - Continue amitriptyline  and as needed Robaxin  - Neurosurgery follow-up in the outpatient  # Chronic osteomyelitis of the L-spine - Continue suppressive therapy with cefadroxil   # Hypothyroidism - Continue Synthroid   #  GERD - Continue Protonix   # Gout - Continue allopurinol   # Insomnia - Continue trazodone  as needed for sleep  DVT prophylaxis: Lovenox      Code Status: Full Code  Consults called: None  Family Communication: No family at bedside  Severity of Illness: The appropriate patient status for this  patient is OBSERVATION. Observation status is judged to be reasonable and necessary in order to provide the required intensity of service to ensure the patient's safety. The patient's presenting symptoms, physical exam findings, and initial radiographic and laboratory data in the context of their medical condition is felt to place them at decreased risk for further clinical deterioration. Furthermore, it is anticipated that the patient will be medically stable for discharge from the hospital within 2 midnights of admission.   Level of care: Telemetry   This record has been created using Conservation officer, historic buildings. Errors have been sought and corrected, but may not always be located. Such creation errors do not reflect on the standard of care.   Lou Claretta HERO, MD 12/13/2023, 2:09 AM [facility] Pager: [phone] Isaiah 41:10   If 7PM-7AM, please contact night-coverage [email or web address] Password [email or web address]

## 2023-12-14 DIAGNOSIS — E11649 Type 2 diabetes mellitus with hypoglycemia without coma: Secondary | ICD-10-CM | POA: Diagnosis not present

## 2023-12-14 DIAGNOSIS — R55 Syncope and collapse: Secondary | ICD-10-CM | POA: Diagnosis present

## 2023-12-14 DIAGNOSIS — I110 Hypertensive heart disease with heart failure: Secondary | ICD-10-CM | POA: Diagnosis not present

## 2023-12-14 DIAGNOSIS — G2581 Restless legs syndrome: Secondary | ICD-10-CM | POA: Diagnosis not present

## 2023-12-14 DIAGNOSIS — I10 Essential (primary) hypertension: Secondary | ICD-10-CM | POA: Diagnosis not present

## 2023-12-14 DIAGNOSIS — R7989 Other specified abnormal findings of blood chemistry: Secondary | ICD-10-CM | POA: Diagnosis not present

## 2023-12-14 DIAGNOSIS — J449 Chronic obstructive pulmonary disease, unspecified: Secondary | ICD-10-CM | POA: Diagnosis not present

## 2023-12-14 DIAGNOSIS — M109 Gout, unspecified: Secondary | ICD-10-CM | POA: Diagnosis not present

## 2023-12-14 DIAGNOSIS — E669 Obesity, unspecified: Secondary | ICD-10-CM | POA: Diagnosis not present

## 2023-12-14 DIAGNOSIS — E876 Hypokalemia: Secondary | ICD-10-CM | POA: Diagnosis not present

## 2023-12-14 DIAGNOSIS — E785 Hyperlipidemia, unspecified: Secondary | ICD-10-CM | POA: Diagnosis not present

## 2023-12-14 DIAGNOSIS — Z79890 Hormone replacement therapy (postmenopausal): Secondary | ICD-10-CM | POA: Diagnosis not present

## 2023-12-14 DIAGNOSIS — E1169 Type 2 diabetes mellitus with other specified complication: Secondary | ICD-10-CM | POA: Diagnosis not present

## 2023-12-14 DIAGNOSIS — Z79899 Other long term (current) drug therapy: Secondary | ICD-10-CM | POA: Diagnosis not present

## 2023-12-14 DIAGNOSIS — K219 Gastro-esophageal reflux disease without esophagitis: Secondary | ICD-10-CM | POA: Diagnosis not present

## 2023-12-14 DIAGNOSIS — I5032 Chronic diastolic (congestive) heart failure: Secondary | ICD-10-CM | POA: Diagnosis not present

## 2023-12-14 DIAGNOSIS — Z85828 Personal history of other malignant neoplasm of skin: Secondary | ICD-10-CM | POA: Diagnosis not present

## 2023-12-14 DIAGNOSIS — G4700 Insomnia, unspecified: Secondary | ICD-10-CM | POA: Diagnosis not present

## 2023-12-14 DIAGNOSIS — I951 Orthostatic hypotension: Secondary | ICD-10-CM | POA: Diagnosis not present

## 2023-12-14 DIAGNOSIS — M47816 Spondylosis without myelopathy or radiculopathy, lumbar region: Secondary | ICD-10-CM | POA: Diagnosis not present

## 2023-12-14 DIAGNOSIS — Z886 Allergy status to analgesic agent status: Secondary | ICD-10-CM | POA: Diagnosis not present

## 2023-12-14 DIAGNOSIS — R531 Weakness: Secondary | ICD-10-CM | POA: Diagnosis not present

## 2023-12-14 DIAGNOSIS — E870 Hyperosmolality and hypernatremia: Secondary | ICD-10-CM | POA: Diagnosis not present

## 2023-12-14 DIAGNOSIS — E1143 Type 2 diabetes mellitus with diabetic autonomic (poly)neuropathy: Secondary | ICD-10-CM | POA: Diagnosis not present

## 2023-12-14 DIAGNOSIS — F05 Delirium due to known physiological condition: Secondary | ICD-10-CM | POA: Diagnosis not present

## 2023-12-14 DIAGNOSIS — Z87891 Personal history of nicotine dependence: Secondary | ICD-10-CM | POA: Diagnosis not present

## 2023-12-14 DIAGNOSIS — E119 Type 2 diabetes mellitus without complications: Secondary | ICD-10-CM | POA: Diagnosis not present

## 2023-12-14 DIAGNOSIS — F32A Depression, unspecified: Secondary | ICD-10-CM | POA: Diagnosis not present

## 2023-12-14 DIAGNOSIS — E039 Hypothyroidism, unspecified: Secondary | ICD-10-CM | POA: Diagnosis not present

## 2023-12-14 LAB — VITAMIN B12: Vitamin B-12: 2346 pg/mL — ABNORMAL HIGH (ref 180–914)

## 2023-12-14 LAB — GLUCOSE, CAPILLARY
Glucose-Capillary: 84 mg/dL (ref 70–99)
Glucose-Capillary: 92 mg/dL (ref 70–99)
Glucose-Capillary: 94 mg/dL (ref 70–99)
Glucose-Capillary: 119 mg/dL — ABNORMAL HIGH (ref 70–99)

## 2023-12-14 LAB — TSH: TSH: <0.1 u[IU]/mL — ABNORMAL LOW (ref 0.350–4.500)

## 2023-12-14 LAB — HEMOGLOBIN A1C
Hgb A1c MFr Bld: 5.5 % (ref 4.8–5.6)
Mean Plasma Glucose: 111 mg/dL

## 2023-12-14 LAB — AMMONIA: Ammonia: 65 umol/L — ABNORMAL HIGH (ref 9–35)

## 2023-12-14 LAB — CORTISOL-AM, BLOOD: Cortisol - AM: 4.1 ug/dL — ABNORMAL LOW (ref 6.7–22.6)

## 2023-12-14 MED ORDER — IBUPROFEN 200 MG PO TABS
400.0000 mg | ORAL_TABLET | Freq: Three times a day (TID) | ORAL | Status: DC
  Administered 2023-12-14 – 2023-12-21 (×21): 400 mg via ORAL
  Filled 2023-12-14 (×21): qty 2

## 2023-12-14 MED ORDER — ACETAMINOPHEN 500 MG PO TABS: 1000.0000 mg | ORAL_TABLET | Freq: Four times a day (QID) | ORAL | Status: DC

## 2023-12-14 MED ORDER — SODIUM CHLORIDE 0.9 % IV SOLN: INTRAVENOUS | Status: AC

## 2023-12-14 MED ORDER — IBUPROFEN 200 MG PO TABS: 400.0000 mg | ORAL_TABLET | Freq: Three times a day (TID) | ORAL | Status: DC

## 2023-12-14 MED ORDER — MIDODRINE HCL 5 MG PO TABS
2.5000 mg | ORAL_TABLET | Freq: Two times a day (BID) | ORAL | Status: DC
  Administered 2023-12-14 – 2023-12-21 (×14): 2.5 mg via ORAL
  Filled 2023-12-14 (×14): qty 1

## 2023-12-14 NOTE — Progress Notes (Signed)
 Physical Therapy Treatment Patient Details Name: Brandi Stephens MRN: [id number] DOB: October 27, 1945 Today's Date: 12/14/2023   History of Present Illness 78 y.o. female admitted 12/12/23 due to generalized weakness, dizziness, and multiple falls. PMH includes chronic diastolic heart failure, HTN, HLD, GERD, DDD s/p spinal fusion, chronic back pain, gout, hypothyroidism, breast cancer s/p lumpectomy, gastroparesis, anxiety and depression, osteomyelitis of the lumbar spine on suppressive antibiotics    PT Comments  Pt resting supine in bed, denies symptoms of dizziness at rest. Pt rolls and sits upright with increased feeling of nausea and returns to side lying states that she will definitely vomit if I keep trying. Pt provided HEP to complete in bed up to 3 times per day to maintain/progress strength and assist with relaxation techniques. Pt unable to progress beyond this level of activity, will continue to monitor. Based on pt PLOF, level of support, and current functional status, pt will benefit from skilled inpatient physical therapy <3 hours per day at dc for safe progression of functional mobility.     If plan is discharge home, recommend the following: A little help with walking and/or transfers;A little help with bathing/dressing/bathroom;Assistance with cooking/housework;Assist for transportation;Help with stairs or ramp for entrance   Can travel by private vehicle     No  Equipment Recommendations  None recommended by PT    Recommendations for Other Services       Precautions / Restrictions Precautions Precautions: Fall Recall of Precautions/Restrictions: Intact Restrictions Weight Bearing Restrictions Per Provider Order: No     Mobility  Bed Mobility Overal bed mobility: Needs Assistance Bed Mobility: Sidelying to Sit, Rolling, Sit to Sidelying Rolling: Supervision Sidelying to sit: Used rails, Supervision     Sit to sidelying: Supervision General bed mobility comments:  pt completes supine to sidelying and sidelying to sit with feeling of imminent vomiting, returns to sidelying and then supine again. Symptoms settle with return to supine    Transfers                   General transfer comment: Unable to progress mobility due to symptoms of nausea and vomiting    Ambulation/Gait                   Stairs             Wheelchair Mobility     Tilt Bed    Modified Rankin (Stroke Patients Only)       Balance Overall balance assessment: Needs assistance Sitting-balance support: Single extremity supported, Feet supported Sitting balance-Leahy Scale: Fair                                      Hotel manager: No apparent difficulties  Cognition Arousal: Alert Behavior During Therapy: WFL for tasks assessed/performed   PT - Cognitive impairments: No apparent impairments                         Following commands: Intact      Cueing Cueing Techniques: Verbal cues  Exercises General Exercises - Lower Extremity Straight Leg Raises: AROM, 5 reps, Both, Supine Other Exercises Other Exercises: bridging x10 reps Other Exercises: diaphragmatic breathing x15 breaths    General Comments General comments (skin integrity, edema, etc.): pt reports inc nausea, was able to get in the chair earlier today, but had near fall due to dizziness, reports  she tried to sit up earlier and had emesis, unable to maintain sitting EOB.      Pertinent Vitals/Pain Pain Assessment Pain Assessment: Faces Faces Pain Scale: Hurts a little bit Pain Location: R hand Pain Descriptors / Indicators: Aching, Discomfort Pain Intervention(s): Limited activity within patient's tolerance, Monitored during session    Home Living                          Prior Function            PT Goals (current goals can now be found in the care plan section) Acute Rehab PT Goals Patient Stated Goal:  feel better. home and resume HHPT. PT Goal Formulation: With patient Time For Goal Achievement: 12/27/23 Potential to Achieve Goals: Good Progress towards PT goals: Not progressing toward goals - comment (decreased tolerance to progressive mobility)    Frequency    Min 3X/week      PT Plan      Co-evaluation              AM-PAC PT 6 Clicks Mobility   Outcome Measure  Help needed turning from your back to your side while in a flat bed without using bedrails?: A Little Help needed moving from lying on your back to sitting on the side of a flat bed without using bedrails?: A Little Help needed moving to and from a bed to a chair (including a wheelchair)?: A Lot Help needed standing up from a chair using your arms (e.g., wheelchair or bedside chair)?: A Lot Help needed to walk in hospital room?: A Lot Help needed climbing 3-5 steps with a railing? : A Lot 6 Click Score: 14    End of Session   Activity Tolerance: Treatment limited secondary to medical complications (Comment) (nausea) Patient left: in bed;with call bell/phone within reach;with bed alarm set;Other (comment) (lab entering room when PT leaving) Nurse Communication: Mobility status PT Visit Diagnosis: Muscle weakness (generalized) (M62.81);Difficulty in walking, not elsewhere classified (R26.2)     Time: 8599-8584 PT Time Calculation (min) (ACUTE ONLY): 15 min  Charges:    $Therapeutic Exercise: 8-22 mins PT General Charges $$ ACUTE PT VISIT: 1 Visit                     Stann, PT Acute Rehabilitation Services Office: [phone] 12/14/2023    Stann Brandi Stephens 12/14/2023, 2:32 PM

## 2023-12-14 NOTE — Care Management Obs Status (Signed)
 MEDICARE OBSERVATION STATUS NOTIFICATION   Patient Details  Name: Brandi Stephens MRN: [id number] Date of Birth: April 23, 1945   Medicare Observation Status Notification Given:  Yes    Tawni CHRISTELLA Eva, LCSW 12/14/2023, 10:30 AM

## 2023-12-14 NOTE — Progress Notes (Signed)
 PROGRESS NOTE    Brandi Stephens  FMW:[id number] DOB: 1945-08-11 DOA: 12/12/2023 PCP: Bulah Alm RAMAN, PA-C    Brief Narrative:  78 year old with history of chronic diastolic heart failure, hypertension, hyperlipidemia, GERD, spinal fusion and chronic back pain and osteomyelitis on suppressive antibiotic therapy, anxiety and depression, recently diagnosed transaminitis under investigation presented to the ER with multiple fall, dizziness and lightheadedness, chronic back pain.  Describes episodes of blackout and fall.  In the emergency room hemodynamically stable.  Blood pressure stable.  Renal functions normal.  Bilirubin, AST and ALT elevated but comparable to outpatient.  EKG with bradycardia heart rate 48.  Chest x-ray normal.  CT head normal.  Right upper quadrant ultrasound with contracted gallbladder.  Orthostatic was not checked in the ER.  Subjective: Patient seen and examined.  Did not sleep well last night.  At rest, she feels all right.  Attempted to mobilize her, she is dizzy lightheaded, tachycardic.  Also complains of back pain. Today, she had no drop in orthostatic blood pressures when taken more time to stand up but she could not tolerate standing up.  Daughter at the bedside. Patient is not safe to walk independently today.  May have to go to a skilled nursing facility. We discussed about orthostatic hypotension and extreme precautions to take. Intermittent hallucination reported.   Assessment & Plan:   Multiple falls, severe orthostatic symptoms with history of orthostatic hypotension: Discontinue muscle relaxants.  Continue Lyrica . IV fluids 100 mL/h.  Continue IV fluids today. TED hose. Midodrine  2.5 mg twice daily.  Will accept supine hypertension in order to have room for orthostatic drop. Extreme orthostatic precautions.  May need further medication adjustment.  Continue mobility with PT OT. Will check a.m. cortisol.  Transaminitis, abnormal LFT: Recently been  worked up as outpatient.  Continue follow-up.  Recheck tomorrow morning.  Chronic back pain, status post spinal fusion, chronic osteomyelitis of the L-spine: Patient on chronic suppressive therapy with cefadroxil .  Continue.  Allergic to Tylenol .  Will use scheduled ibuprofen  today.  Already on Lyrica .  Hypothyroidism: On Synthroid  we will continue.  Check TSH.  GERD: On PPI.  Gout, on allopurinol .  Confusion, hallucination: Hospital-acquired delirium.  Fall precautions.  Delirium precautions.  Avoid benzodiazepines.  Check TSH, B12, ammonia levels.  CT head with chronic changes.  No acute changes.     DVT prophylaxis: Place TED hose Start: 12/13/23 1008 enoxaparin  (LOVENOX ) injection 40 mg Start: 12/13/23 1000   Code Status: Full code Family Communication: Daughter at bedside. Disposition Plan: Status is: Inpatient.  Significant symptoms.  IV fluids.   Consultants:  None  Procedures:  None  Antimicrobials:  Cefadroxil , chronic suppressive therapy     Objective: Vitals:   12/13/23 1337 12/13/23 2139 12/14/23 0619 12/14/23 1142  BP: (!) 115/56 (!) 152/92 (!) 190/112 (!) 162/103  Pulse: (!) 103 93 (!) 109 (!) 120  Resp: 18  16   Temp: 99.4 F (37.4 C) 98.6 F (37 C) 97.7 F (36.5 C) 98.7 F (37.1 C)  TempSrc: Oral Oral Oral Oral  SpO2: 92% 94% 95%   Weight:      Height:        Intake/Output Summary (Last 24 hours) at 12/14/2023 1320 Last data filed at 12/14/2023 0900 Gross per 24 hour  Intake 790.51 ml  Output --  Net 790.51 ml   Filed Weights   12/13/23 0000  Weight: 73.7 kg    Examination:  General exam: Appears anxious.  Apprehensive and anxious on attempted  mobility. Respiratory system: Clear to auscultation. Respiratory effort normal. Cardiovascular system: S1 & S2 heard, RRR. No JVD, murmurs, rubs, gallops or clicks.  Trace bilateral pedal edema. Gastrointestinal system: Soft.  Nontender.  Bowel sounds present. Central nervous system: Alert and  awake.  Mostly oriented.  Intermittent hallucinations reported to the medical staff.     Data Reviewed: I have personally reviewed following labs and imaging studies  CBC: Recent Labs  Lab 12/12/23 1602 12/13/23 0147  WBC 7.1 6.7  HGB 13.9 15.2*  HCT 41.5 46.1*  MCV 96.1 97.1  PLT 117* 104*   Basic Metabolic Panel: Recent Labs  Lab 12/12/23 1602 12/13/23 0147  NA 140 142  K 3.5 3.7  CL 101 102  CO2 26 24  GLUCOSE 178* 125*  BUN 22 21  CREATININE 1.15* 0.96  CALCIUM  9.6 9.7  MG  --  1.7  PHOS  --  2.6   GFR: Estimated Creatinine Clearance: 48.7 mL/min (by C-G formula based on SCr of 0.96 mg/dL). Liver Function Tests: Recent Labs  Lab 12/12/23 1602 12/13/23 0617  AST 426* 385*  ALT 414* 374*  ALKPHOS 108 101  BILITOT 3.3* 2.8*  PROT 5.0* 4.7*  ALBUMIN  3.2* 3.0*   No results for input(s): LIPASE, AMYLASE in the last 168 hours. No results for input(s): AMMONIA in the last 168 hours. Coagulation Profile: No results for input(s): INR, PROTIME in the last 168 hours. Cardiac Enzymes: No results for input(s): CKTOTAL, CKMB, CKMBINDEX, TROPONINI in the last 168 hours. BNP (last 3 results) No results for input(s): PROBNP in the last 8760 hours. HbA1C: Recent Labs    12/13/23 0147  HGBA1C 5.5   CBG: Recent Labs  Lab 12/13/23 0722 12/13/23 1151 12/13/23 1626 12/14/23 0716 12/14/23 1211  GLUCAP 93 141* 116* 84 92   Lipid Profile: No results for input(s): CHOL, HDL, LDLCALC, TRIG, CHOLHDL, LDLDIRECT in the last 72 hours. Thyroid  Function Tests: No results for input(s): TSH, T4TOTAL, FREET4, T3FREE, THYROIDAB in the last 72 hours. Anemia Panel: No results for input(s): VITAMINB12, FOLATE, FERRITIN, TIBC, IRON, RETICCTPCT in the last 72 hours. Sepsis Labs: No results for input(s): PROCALCITON, LATICACIDVEN in the last 168 hours.  No results found for this or any previous visit (from the past 240  hours).       Radiology Studies: US  Abdomen Limited RUQ (LIVER/GB) Result Date: 12/12/2023 CLINICAL DATA:  [id number] RUQ abdominal pain [id number] EXAM: ULTRASOUND ABDOMEN LIMITED RIGHT UPPER QUADRANT COMPARISON:  None Available. FINDINGS: Gallbladder: Contracted gallbladder. No gallstones or wall thickening visualized. No sonographic Murphy sign noted by sonographer. Common bile duct: Diameter: 3 mm Liver: Limited evaluation of the left hepatic lobe. No focal lesion identified. Within normal limits in parenchymal echogenicity. Portal vein is patent on color Doppler imaging with normal direction of blood flow towards the liver. Other: None. IMPRESSION: 1. Unremarkable right upper quadrant ultrasound. 2. Contracted gallbladder. Electronically Signed   By: Morgane  Naveau M.D.   On: 12/12/2023 19:44   CT Head Wo Contrast Result Date: 12/12/2023 CLINICAL DATA:  Dizziness and weakness with subsequent fall. EXAM: CT HEAD WITHOUT CONTRAST TECHNIQUE: Contiguous axial images were obtained from the base of the skull through the vertex without intravenous contrast. RADIATION DOSE REDUCTION: This exam was performed according to the departmental dose-optimization program which includes automated exposure control, adjustment of the mA and/or kV according to patient size and/or use of iterative reconstruction technique. COMPARISON:  July 23, 2022 FINDINGS: Brain: There is generalized cerebral atrophy with widening of  the extra-axial spaces and ventricular dilatation. There are areas of decreased attenuation within the white matter tracts of the supratentorial brain, consistent with microvascular disease changes. Vascular: Mild to moderate severity bilateral cavernous carotid artery calcification is noted. Skull: Normal. Negative for fracture or focal lesion. Sinuses/Orbits: No acute finding. Other: None. IMPRESSION: 1. Generalized cerebral atrophy and microvascular disease changes of the supratentorial brain. 2. No acute  intracranial abnormality. Electronically Signed   By: Suzen Dials M.D.   On: 12/12/2023 19:30   DG Chest 2 View Result Date: 12/12/2023 CLINICAL DATA:  Status post fall. EXAM: CHEST - 2 VIEW COMPARISON:  None Availa chest radiograph July 18, 2022.  Ble. FINDINGS: The heart size and mediastinal contours are within normal limits. No suspicious pulmonary nodule or consolidation. The visualized skeletal structures are unremarkable. No acute fracture identified. Mild compression deformity of the thoracic spine, similar to prior. Surgical clips overlying the left chest wall from prior lumpectomy. IMPRESSION: No active cardiopulmonary disease.  No acute osseous lesion. Electronically Signed   By: Megan  Zare M.D.   On: 12/12/2023 17:54        Scheduled Meds:  allopurinol   300 mg Oral QHS   amitriptyline   25 mg Oral QHS   cefadroxil   500 mg Oral BID   cholecalciferol   2,000 Units Oral Daily   enoxaparin  (LOVENOX ) injection  40 mg Subcutaneous Q24H   ibuprofen   400 mg Oral TID   insulin  aspart  0-15 Units Subcutaneous TID WC   levothyroxine   75 mcg Oral Q0600   midodrine   2.5 mg Oral BID WC   pantoprazole   40 mg Oral BID   pregabalin   50 mg Oral TID   Continuous Infusions:  sodium chloride  100 mL/hr at 12/14/23 1153     LOS: 0 days    Time spent: 52 minutes    Renato Applebaum, MD [facility]

## 2023-12-14 NOTE — TOC Initial Note (Signed)
 Transition of Care [facility]) - Initial/Assessment Note    Patient Details  Name: Brandi Stephens MRN: [id number] Date of Birth: 27-Apr-1945  [facility]) CM/SW Contact:    Tawni CHRISTELLA Eva, LCSW Phone Number: 12/14/2023, 11:48 AM  Clinical Narrative:                  CSW met with pt and daughter at bedside to discuss recommendations for home health services. Pt reports she is active with [facility] HHPT/aide and RN. She reports having 3in1 , walker, and has no DME needs at this time. Pt's family will provided transportation upon d/c.   Expected Discharge Plan: Home w Home Health Services Barriers to Discharge: Continued Medical Work up   Patient Goals and CMS Choice Patient states their goals for this hospitalization and ongoing recovery are:: retrun home with home health          Expected Discharge Plan and Services       Living arrangements for the past 2 months: Single Family Home                                      Prior Living Arrangements/Services Living arrangements for the past 2 months: Single Family Home Lives with:: Self Patient language and need for interpreter reviewed:: Yes Do you feel safe going back to the place where you live?: Yes      Need for Family Participation in Patient Care: No (Comment) Care giver support system in place?: No (comment) Current home services: Homehealth aide, Home PT, Home RN Criminal Activity/Legal Involvement Pertinent to Current Situation/Hospitalization: No - Comment as needed  Activities of Daily Living   ADL Screening (condition at time of admission) Independently performs ADLs?: Yes (appropriate for developmental age) Is the patient deaf or have difficulty hearing?: No Does the patient have difficulty seeing, even when wearing glasses/contacts?: No Does the patient have difficulty concentrating, remembering, or making decisions?: Yes  Permission Sought/Granted                  Emotional  Assessment Appearance:: Appears stated age Attitude/Demeanor/Rapport: Gracious Affect (typically observed): Accepting Orientation: : Oriented to Place, Oriented to  Time, Oriented to Situation, Oriented to Self   Psych Involvement: No (comment)  Admission diagnosis:  Elevated LFTs [R79.89] Generalized weakness [R53.1] Syncope, unspecified syncope type [R55] Syncope and collapse [R55] Patient Active Problem List   Diagnosis Date Noted   Syncope and collapse 12/14/2023   Syncope 12/13/2023   Elevated LFTs 12/13/2023   Generalized weakness 12/12/2023   Transaminitis 10/27/2023   Chronic back pain 06/06/2023   Myalgia due to statin 02/13/2023   Medication adverse effect, sequela 02/13/2023   Hypomagnesemia 11/03/2022   Loss of weight 07/22/2022   Gastroesophageal reflux disease 07/22/2022   UTI (urinary tract infection) 07/19/2022   Severe sepsis with acute organ dysfunction (HCC) 06/20/2022   Enlarged and hypertrophic nails 04/07/2022   Dizziness 04/07/2022   Postmenopausal 04/07/2022   Mixed dyslipidemia 04/07/2022   Estrogen deficiency 04/07/2022   Autoimmune disease (HCC) 04/07/2022   Edema 04/07/2022   Chronic venous insufficiency 04/07/2022   Polypharmacy 01/05/2022   High risk medication use 01/05/2022   Type 2 diabetes mellitus (HCC) 11/19/2021   Frequent falls 08/05/2021   Elevated uric acid in blood 08/05/2021   Vitamin deficiency 08/05/2021   Chronic hypoxemic respiratory failure (HCC) 08/05/2021   Type II diabetes mellitus with  complication (HCC) 08/05/2021   Other chronic pain 08/05/2021   Vaccine counseling 08/05/2021   Hypoxemia 07/02/2021   Morbid obesity (HCC)    Short-term memory loss 02/24/2020   Osteopenia 01/22/2020   Aortic atherosclerosis (HCC) 01/22/2020   Insomnia 10/29/2019   Recurrent falls 09/06/2019   Confusion, postoperative    Chronic constipation    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   Muscle spasm    Hypokalemia    Status post  lumbar spinal fusion 07/10/2019   Postoperative pain    Labile blood pressure    Hypoalbuminemia due to protein-calorie malnutrition (HCC)    Anxiety 07/05/2019   Depression 07/05/2019   Chronic diastolic CHF (congestive heart failure) (HCC) 07/05/2019   Obesity (BMI 30-39.9) 07/05/2019   Lumbar radiculopathy 06/28/2019   Spinal stenosis of lumbar region 06/28/2019   Tremors of nervous system    Chronic pain syndrome    Osteomyelitis of lumbar spine (HCC) 06/25/2019   Muscular deconditioning    Nausea and vomiting 06/24/2019   History of sepsis 06/24/2019   Recent major surgery 06/24/2019   History of diverticulosis 06/24/2019   Elevated PTHrP level 05/09/2019   Low back pain 02/12/2019   Neuropathy 12/20/2018   Lumbar spinal stenosis 06/29/2018   Abnormal CT of the chest 06/04/2018   Hypothyroidism 11/30/2017   Thoracic aortic atherosclerosis (HCC) 09/14/2016   RLS (restless legs syndrome) 09/09/2016   H/O adenomatous polyp of colon 07/06/2016   PVC's (premature ventricular contractions) 03/29/2016   Grade I diastolic dysfunction 02/01/2016   Former cigarette smoker 01/11/2016   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   Leg edema 01/11/2016   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   History of left breast cancer 07/14/2015   AKI (acute kidney injury) (HCC) 07/08/2015   Dependent edema 06/08/2015   Sleep disturbance 06/08/2015   Mixed hyperlipidemia 06/08/2015   Generalized anxiety disorder 06/08/2015   Essential hypertension 06/08/2015   COPD (chronic obstructive pulmonary disease) (HCC) 06/08/2015   Gout 06/08/2015   Macrocytic anemia    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HCC)    Lumbar spondylosis 03/20/2015   PCP:  Bulah Alm RAMAN, PA-C Pharmacy:   [phone] - [place], [place] - [address] AT [facility] [address] [place] [address] Phone: [phone] Fax: [phone]     Social Drivers of Health  (SDOH) Social History: SDOH Screenings   Food Insecurity: No Food Insecurity (12/13/2023)  Housing: Low Risk  (12/13/2023)  Transportation Needs: No Transportation Needs (12/13/2023)  Utilities: Not At Risk (12/13/2023)  Alcohol  Screen: Low Risk  (05/02/2023)  Depression (PHQ2-9): Low Risk  (06/26/2023)  Financial Resource Strain: Low Risk  (05/02/2023)  Physical Activity: Inactive (05/02/2023)  Social Connections: Socially Integrated (12/13/2023)  Stress: Stress Concern Present (05/02/2023)  Tobacco Use: Medium Risk (12/12/2023)  Health Literacy: Adequate Health Literacy (05/02/2023)   SDOH Interventions:     Readmission Risk Interventions    06/23/2022   10:55 AM  Readmission Risk Prevention Plan  Transportation Screening Complete  PCP or Specialist Appt within 3-5 Days Complete  HRI or Home Care Consult Complete  Social Work Consult for Recovery Care Planning/Counseling Complete  Palliative Care Screening Not Applicable

## 2023-12-15 DIAGNOSIS — R531 Weakness: Secondary | ICD-10-CM | POA: Diagnosis not present

## 2023-12-15 LAB — CBC WITH DIFFERENTIAL/PLATELET
Abs Immature Granulocytes: 0.1 K/uL — ABNORMAL HIGH (ref 0.00–0.07)
Basophils Absolute: 0.1 K/uL (ref 0.0–0.1)
Basophils Relative: 2 %
Eosinophils Absolute: 0.4 K/uL (ref 0.0–0.5)
Eosinophils Relative: 7 %
HCT: 40.8 % (ref 36.0–46.0)
Hemoglobin: 13.2 g/dL (ref 12.0–15.0)
Immature Granulocytes: 2 %
Lymphocytes Relative: 27 %
Lymphs Abs: 1.5 K/uL (ref 0.7–4.0)
MCH: 31.6 pg (ref 26.0–34.0)
MCHC: 32.4 g/dL (ref 30.0–36.0)
MCV: 97.6 fL (ref 80.0–100.0)
Monocytes Absolute: 0.5 K/uL (ref 0.1–1.0)
Monocytes Relative: 10 %
Neutro Abs: 3 K/uL (ref 1.7–7.7)
Neutrophils Relative %: 52 %
Platelets: 118 K/uL — ABNORMAL LOW (ref 150–400)
RBC: 4.18 MIL/uL (ref 3.87–5.11)
RDW: 15.3 % (ref 11.5–15.5)
WBC: 5.6 K/uL (ref 4.0–10.5)
nRBC: 0 % (ref 0.0–0.2)

## 2023-12-15 LAB — COMPREHENSIVE METABOLIC PANEL WITH GFR
ALT: 293 U/L — ABNORMAL HIGH (ref 0–44)
AST: 263 U/L — ABNORMAL HIGH (ref 15–41)
Albumin: 2.9 g/dL — ABNORMAL LOW (ref 3.5–5.0)
Alkaline Phosphatase: 94 U/L (ref 38–126)
Anion gap: 10 (ref 5–15)
BUN: 12 mg/dL (ref 8–23)
CO2: 23 mmol/L (ref 22–32)
Calcium: 9 mg/dL (ref 8.9–10.3)
Chloride: 112 mmol/L — ABNORMAL HIGH (ref 98–111)
Creatinine, Ser: 0.84 mg/dL (ref 0.44–1.00)
GFR, Estimated: >60 mL/min (ref >60)
Glucose, Bld: 101 mg/dL — ABNORMAL HIGH (ref 70–99)
Potassium: 3.5 mmol/L (ref 3.5–5.1)
Sodium: 146 mmol/L — ABNORMAL HIGH (ref 135–145)
Total Bilirubin: 3.8 mg/dL — ABNORMAL HIGH (ref 0.0–1.2)
Total Protein: 4.4 g/dL — ABNORMAL LOW (ref 6.5–8.1)

## 2023-12-15 LAB — GLUCOSE, CAPILLARY
Glucose-Capillary: 94 mg/dL (ref 70–99)
Glucose-Capillary: 119 mg/dL — ABNORMAL HIGH (ref 70–99)
Glucose-Capillary: 133 mg/dL — ABNORMAL HIGH (ref 70–99)
Glucose-Capillary: 134 mg/dL — ABNORMAL HIGH (ref 70–99)

## 2023-12-15 LAB — CORTISOL-AM, BLOOD: Cortisol - AM: 2.1 ug/dL — ABNORMAL LOW (ref 6.7–22.6)

## 2023-12-15 LAB — MAGNESIUM: Magnesium: 1.5 mg/dL — ABNORMAL LOW (ref 1.7–2.4)

## 2023-12-15 MED ORDER — MAGNESIUM SULFATE 2 GM/50ML IV SOLN
2.0000 g | Freq: Once | INTRAVENOUS | Status: AC
  Administered 2023-12-15: 2 g via INTRAVENOUS
  Filled 2023-12-15: qty 50

## 2023-12-15 MED ORDER — MAGNESIUM OXIDE -MG SUPPLEMENT 400 (240 MG) MG PO TABS
400.0000 mg | ORAL_TABLET | Freq: Every day | ORAL | Status: DC
  Administered 2023-12-16 – 2023-12-21 (×6): 400 mg via ORAL
  Filled 2023-12-15 (×7): qty 1

## 2023-12-15 MED ORDER — LEVOTHYROXINE SODIUM 50 MCG PO TABS
50.0000 ug | ORAL_TABLET | Freq: Every day | ORAL | Status: DC
  Administered 2023-12-16 – 2023-12-21 (×6): 50 ug via ORAL
  Filled 2023-12-15 (×6): qty 1

## 2023-12-15 MED ORDER — COSYNTROPIN 0.25 MG IJ SOLR
0.2500 mg | Freq: Once | INTRAMUSCULAR | Status: AC
  Administered 2023-12-16: 0.25 mg via INTRAVENOUS
  Filled 2023-12-15: qty 0.25

## 2023-12-15 NOTE — Progress Notes (Signed)
 Physical Therapy Treatment Patient Details Name: Brandi Stephens MRN: [id number] DOB: [birth year] Today's Date: 12/15/2023   History of Present Illness 78 y.o. female admitted 12/12/23 due to generalized weakness, dizziness, and multiple falls. PMH includes chronic diastolic heart failure, HTN, HLD, GERD, DDD s/p spinal fusion, chronic back pain, gout, hypothyroidism, breast cancer s/p lumpectomy, gastroparesis, anxiety and depression, osteomyelitis of the lumbar spine on suppressive antibiotics    PT Comments  Pt admitted with above diagnosis.  Pt currently with functional limitations due to the deficits listed below (see PT Problem List). Pt in bed when PT arrived. Daughter present. Pt agreeable to therapy intervention. Pt required S and min cues for log roll technique from flat surface with use of bed rails, G sitting balance EOB, sit to stand  and SPT with RW and CGA with min cues, static standing for hand hygiene at sink no UE support with cues for proper alignment and to maintain back precautions with functional mobility tasks, gait assessed bed to bathroom and bathroom to recliner with RW, CGA and min cues for safety, posture and obstacle navigation as well as approach to sitting surfaces with proper UE and AD placement. Pt left in recliner all needs in place and daughter present. Nurse aware of Bp findings. will benefit from acute skilled PT to increase their independence and safety with mobility to allow discharge.    Bp supine 189/115 (108) Bp seated EOB 183/163 (122 PR) Bp imitate standing 135/93 (125 PR)   If plan is discharge home, recommend the following: A little help with walking and/or transfers;A little help with bathing/dressing/bathroom;Assistance with cooking/housework;Assist for transportation;Help with stairs or ramp for entrance   Can travel by private vehicle     Yes  Equipment Recommendations  None recommended by PT    Recommendations for Other Services        Precautions / Restrictions Precautions Precautions: Fall Recall of Precautions/Restrictions: Intact Restrictions Weight Bearing Restrictions Per Provider Order: No     Mobility  Bed Mobility Overal bed mobility: Needs Assistance Bed Mobility: Sidelying to Sit   Sidelying to sit: Used rails, Supervision       General bed mobility comments: no reports of nausea with bed mobility, pt requried cues for log roll technique bed flat and use of bed rails, B compression stockings donned and pt able to perform LE movements priort to supine to sit    Transfers Overall transfer level: Needs assistance Equipment used: Rolling walker (2 wheels) Transfers: Sit to/from Stand Sit to Stand: Contact guard assist           General transfer comment: pt is asymtomatic for orthostatic hypotension and motivated to ambulate to the bathroom today, CGA and min cues for RW and UE placement for bed, recliner and commode transfers, pt able to observe back precuations with min cues during fuctional mobiltity tasks.    Ambulation/Gait Ambulation/Gait assistance: Contact guard assist Gait Distance (Feet): 15 Feet Assistive device: Rolling walker (2 wheels) Gait Pattern/deviations: Step-through pattern, Trunk flexed Gait velocity: decreased     General Gait Details: slight trunk flexion with min cues for posture, pt requried CGA and cues for safety with obstacle navigation bed to bathroom and bathroom to recliner   Stairs             Wheelchair Mobility     Tilt Bed    Modified Rankin (Stroke Patients Only)       Balance Overall balance assessment: Needs assistance Sitting-balance support: Feet supported Sitting balance-Leahy  Scale: Good     Standing balance support: Reliant on assistive device for balance, During functional activity Standing balance-Leahy Scale: Fair Standing balance comment: static standing no UE support                            Communication  Communication Communication: No apparent difficulties  Cognition Arousal: Alert Behavior During Therapy: WFL for tasks assessed/performed   PT - Cognitive impairments: No apparent impairments                         Following commands: Intact      Cueing Cueing Techniques: Verbal cues  Exercises      General Comments General comments (skin integrity, edema, etc.): daughter present, nursing aware of Bp findings and pt remained asymptomatic for orthostatic hypotension throughout intervention      Pertinent Vitals/Pain Pain Assessment Pain Assessment: No/denies pain    Home Living                          Prior Function            PT Goals (current goals can now be found in the care plan section) Acute Rehab PT Goals Patient Stated Goal: feel better. home and resume HHPT. PT Goal Formulation: With patient Time For Goal Achievement: 12/27/23 Potential to Achieve Goals: Good Progress towards PT goals: Progressing toward goals    Frequency    Min 3X/week      PT Plan      Co-evaluation              AM-PAC PT 6 Clicks Mobility   Outcome Measure  Help needed turning from your back to your side while in a flat bed without using bedrails?: A Little Help needed moving from lying on your back to sitting on the side of a flat bed without using bedrails?: A Little Help needed moving to and from a bed to a chair (including a wheelchair)?: A Little Help needed standing up from a chair using your arms (e.g., wheelchair or bedside chair)?: A Little Help needed to walk in hospital room?: A Little Help needed climbing 3-5 steps with a railing? : A Lot 6 Click Score: 17    End of Session Equipment Utilized During Treatment: Gait belt Activity Tolerance: Patient tolerated treatment well Patient left: with call bell/phone within reach;in chair;with nursing/sitter in room Nurse Communication: Mobility status PT Visit Diagnosis: Muscle weakness  (generalized) (M62.81);Difficulty in walking, not elsewhere classified (R26.2)     Time: 8881-8857 PT Time Calculation (min) (ACUTE ONLY): 24 min  Charges:    $Gait Training: 8-22 mins $Therapeutic Activity: 8-22 mins PT General Charges $$ ACUTE PT VISIT: 1 Visit                     [place], PT Acute Rehab    [place] VEAR Drone 12/15/2023, 1:56 PM

## 2023-12-15 NOTE — Progress Notes (Signed)
 PROGRESS NOTE    Brandi Stephens  FMW:[id number] DOB: Feb 04, 1946 DOA: 12/12/2023 PCP: Bulah Alm RAMAN, PA-C    Brief Narrative:  78 year old with history of chronic diastolic heart failure, hypertension, hyperlipidemia, GERD, spinal fusion and chronic back pain and osteomyelitis on suppressive antibiotic therapy, anxiety and depression, recently diagnosed transaminitis under investigation presented to the ER with multiple falls, dizziness and lightheadedness, chronic back pain.  Describes episodes of blackout and fall.  In the emergency room hemodynamically stable.  Blood pressure stable.  Renal functions normal.  Bilirubin, AST and ALT elevated but comparable to outpatient.  EKG with bradycardia heart rate 48.  Chest x-ray normal.  CT head normal.  Right upper quadrant ultrasound with contracted gallbladder.  Orthostatic was not checked in the ER.  Subjective: Patient seen and examined.  She had some hallucinations however they are getting better today.  She is able to sit up today.  She still has some dizziness but much better than earlier as per patient.  She did not drop her systolic blood pressure with orthostatic changes today. TSH highly suppressed.  A.m. cortisol 4.  Will do ACTH  suppression test.   Assessment & Plan:   Multiple falls, severe orthostatic symptoms with history of orthostatic hypotension: Discontinued muscle relaxants.  Continue Lyrica . With IV fluids.  Discontinue. TED hose. Midodrine  2.5 mg twice daily.  Will accept supine hypertension in order to have room for orthostatic drop. All-time orthostatic precautions.  May need further medication adjustment.  Continue mobility with PT OT. A.m. cortisol level is low, not diagnostic.  Patient may have adrenocortical insufficiency.  Will order ACTH  suppression test for tomorrow morning.  Transaminitis, abnormal LFT: Recently been worked up as outpatient.  Continue follow-up.  LFTs are improving.  Chronic back pain,  status post spinal fusion, chronic osteomyelitis of the L-spine: Patient on chronic suppressive therapy with cefadroxil .  Continue.  Allergic to Tylenol .  Will use scheduled ibuprofen . Already on Lyrica .  Hypothyroidism: On Synthroid  we will continue.  TSH is less than 0.1.  Decrease thyroxine from 75 mcg-50 mcg.  GERD: On PPI.  Gout, on allopurinol .  Hypomagnesemia: Replace with IV magnesium .  Also keep on his scheduled oral magnesium .  Confusion, hallucination: Hospital-acquired delirium.  Fall precautions.  Delirium precautions.  Avoid benzodiazepines.  Symptomatic management.     DVT prophylaxis: Place TED hose Start: 12/13/23 1008 enoxaparin  (LOVENOX ) injection 40 mg Start: 12/13/23 1000   Code Status: Full code Family Communication: None today. Disposition Plan: Status is: Inpatient.  Significant symptoms.  Needs SNF.  Medically stable to transfer to SNF if bed available.   Consultants:  None  Procedures:  None  Antimicrobials:  Cefadroxil , chronic suppressive therapy     Objective: Vitals:   12/14/23 1142 12/14/23 1506 12/14/23 2108 12/15/23 0433  BP: (!) 162/103 (!) 144/103 (!) 170/111 (!) 119/98  Pulse: (!) 120 (!) 134 (!) 108   Resp:   18 18  Temp: 98.7 F (37.1 C) 98 F (36.7 C) 98.6 F (37 C)   TempSrc: Oral Oral Oral   SpO2:  95% 92% 91%  Weight:      Height:        Intake/Output Summary (Last 24 hours) at 12/15/2023 1319 Last data filed at 12/15/2023 0920 Gross per 24 hour  Intake 551.67 ml  Output --  Net 551.67 ml   Filed Weights   12/13/23 0000  Weight: 73.7 kg    Examination:  General exam: Looked comfortable today.  She is frail and debilitated.  Not in any distress. Respiratory system: Clear to auscultation. Respiratory effort normal. Cardiovascular system: S1 & S2 heard, RRR. No JVD, murmurs, rubs, gallops or clicks.  Trace bilateral pedal edema. Gastrointestinal system: Soft.  Nontender.  Bowel sounds present. Central nervous  system: Alert and awake.  Mostly oriented.  Intermittent hallucinations reported to the medical staff.  Alert awake and oriented.  Pleasant to conversation today.     Data Reviewed: I have personally reviewed following labs and imaging studies  CBC: Recent Labs  Lab 12/12/23 1602 12/13/23 0147 12/15/23 0641  WBC 7.1 6.7 5.6  NEUTROABS  --   --  3.0  HGB 13.9 15.2* 13.2  HCT 41.5 46.1* 40.8  MCV 96.1 97.1 97.6  PLT 117* 104* 118*   Basic Metabolic Panel: Recent Labs  Lab 12/12/23 1602 12/13/23 0147 12/15/23 0641  NA 140 142 146*  K 3.5 3.7 3.5  CL 101 102 112*  CO2 26 24 23   GLUCOSE 178* 125* 101*  BUN 22 21 12   CREATININE 1.15* 0.96 0.84  CALCIUM  9.6 9.7 9.0  MG  --  1.7 1.5*  PHOS  --  2.6  --    GFR: Estimated Creatinine Clearance: 55.6 mL/min (by C-G formula based on SCr of 0.84 mg/dL). Liver Function Tests: Recent Labs  Lab 12/12/23 1602 12/13/23 0617 12/15/23 0641  AST 426* 385* 263*  ALT 414* 374* 293*  ALKPHOS 108 101 94  BILITOT 3.3* 2.8* 3.8*  PROT 5.0* 4.7* 4.4*  ALBUMIN  3.2* 3.0* 2.9*   No results for input(s): LIPASE, AMYLASE in the last 168 hours. Recent Labs  Lab 12/14/23 1428  AMMONIA 65*   Coagulation Profile: No results for input(s): INR, PROTIME in the last 168 hours. Cardiac Enzymes: No results for input(s): CKTOTAL, CKMB, CKMBINDEX, TROPONINI in the last 168 hours. BNP (last 3 results) No results for input(s): PROBNP in the last 8760 hours. HbA1C: Recent Labs    12/13/23 0147  HGBA1C 5.5   CBG: Recent Labs  Lab 12/14/23 1211 12/14/23 1707 12/14/23 2111 12/15/23 0716 12/15/23 1144  GLUCAP 92 94 119* 94 119*   Lipid Profile: No results for input(s): CHOL, HDL, LDLCALC, TRIG, CHOLHDL, LDLDIRECT in the last 72 hours. Thyroid  Function Tests: Recent Labs    12/14/23 1428  TSH <0.100*   Anemia Panel: Recent Labs    12/14/23 1428  VITAMINB12 2,346*   Sepsis Labs: No results for  input(s): PROCALCITON, LATICACIDVEN in the last 168 hours.  No results found for this or any previous visit (from the past 240 hours).       Radiology Studies: No results found.       Scheduled Meds:  allopurinol   300 mg Oral QHS   amitriptyline   25 mg Oral QHS   cefadroxil   500 mg Oral BID   cholecalciferol   2,000 Units Oral Daily   [START ON 12/16/2023] cosyntropin   0.25 mg Intravenous Once   enoxaparin  (LOVENOX ) injection  40 mg Subcutaneous Q24H   ibuprofen   400 mg Oral Q8H   insulin  aspart  0-15 Units Subcutaneous TID WC   [START ON 12/16/2023] levothyroxine   50 mcg Oral Q0600   [START ON 12/16/2023] magnesium  oxide  400 mg Oral Daily   midodrine   2.5 mg Oral BID WC   pantoprazole   40 mg Oral BID   pregabalin   50 mg Oral TID   Continuous Infusions:  magnesium  sulfate bolus IVPB       LOS: 1 day    Time spent: 52 minutes  Renato Applebaum, MD [facility]

## 2023-12-15 NOTE — TOC Progression Note (Signed)
 Transition of Care [facility]) - Progression Note   Patient Details  Name: Brandi Stephens MRN: [id number] Date of Birth: 11-Jul-1945  [facility]) CM/SW Contact  Duwaine GORMAN Aran, LCSW Phone Number: 12/15/2023, 11:57 AM  Clinical Narrative: PT evaluation now recommending SNF. CSW spoke with patient's daughter, [name], and family would like to pursue rehab at this time. FL2 done; PASRR confirmed. Initial referral faxed out. Care management awaiting bed offers.  Expected Discharge Plan: Skilled Nursing Facility Barriers to Discharge: Continued Medical Work up, SNF Pending bed offer, English as a second language teacher  Expected Discharge Plan and Services In-house Referral: Clinical Social Work Post Acute Care Choice: Skilled Nursing Facility Living arrangements for the past 2 months: Single Family Home           DME Arranged: N/A DME Agency: NA  Social Drivers of Health (SDOH) Interventions SDOH Screenings   Food Insecurity: No Food Insecurity (12/13/2023)  Housing: Low Risk  (12/13/2023)  Transportation Needs: No Transportation Needs (12/13/2023)  Utilities: Not At Risk (12/13/2023)  Alcohol  Screen: Low Risk  (05/02/2023)  Depression (PHQ2-9): Low Risk  (06/26/2023)  Financial Resource Strain: Low Risk  (05/02/2023)  Physical Activity: Inactive (05/02/2023)  Social Connections: Socially Integrated (12/13/2023)  Stress: Stress Concern Present (05/02/2023)  Tobacco Use: Medium Risk (12/12/2023)  Health Literacy: Adequate Health Literacy (05/02/2023)   Readmission Risk Interventions    06/23/2022   10:55 AM  Readmission Risk Prevention Plan  Transportation Screening Complete  PCP or Specialist Appt within 3-5 Days Complete  HRI or Home Care Consult Complete  Social Work Consult for Recovery Care Planning/Counseling Complete  Palliative Care Screening Not Applicable

## 2023-12-15 NOTE — NC FL2 (Signed)
 [place]  MEDICAID [id number] LEVEL OF CARE FORM     IDENTIFICATION  Patient Name: Brandi Stephens Birthdate: [birth year] Sex: female Admission Date (Current Location): 12/12/2023  [facility] and [place] Number:  Producer, television/film/video and Address:  [facility],  [address], [place] [address]      Provider Number: [id number]  Attending Physician Name and Address:  Raenelle Coria, MD  Relative Name and Phone Number:  Mahogony Gilchrest (spouse) Ph: [phone]    Current Level of Care: Hospital Recommended Level of Care: Skilled Nursing Facility Prior Approval Number:    Date Approved/Denied:   PASRR Number: [id number]  Discharge Plan: SNF    Current Diagnoses: Patient Active Problem List   Diagnosis Date Noted   Syncope and collapse 12/14/2023   Syncope 12/13/2023   Elevated LFTs 12/13/2023   Generalized weakness 12/12/2023   Transaminitis 10/27/2023   Chronic back pain 06/06/2023   Myalgia due to statin 02/13/2023   Medication adverse effect, sequela 02/13/2023   Hypomagnesemia 11/03/2022   Loss of weight 07/22/2022   Gastroesophageal reflux disease 07/22/2022   UTI (urinary tract infection) 07/19/2022   Severe sepsis with acute organ dysfunction (HCC) 06/20/2022   Enlarged and hypertrophic nails 04/07/2022   Dizziness 04/07/2022   Postmenopausal 04/07/2022   Mixed dyslipidemia 04/07/2022   Estrogen deficiency 04/07/2022   Autoimmune disease (HCC) 04/07/2022   Edema 04/07/2022   Chronic venous insufficiency 04/07/2022   Polypharmacy 01/05/2022   High risk medication use 01/05/2022   Type 2 diabetes mellitus (HCC) 11/19/2021   Frequent falls 08/05/2021   Elevated uric acid in blood 08/05/2021   Vitamin deficiency 08/05/2021   Chronic hypoxemic respiratory failure (HCC) 08/05/2021   Type II diabetes mellitus with complication (HCC) 08/05/2021   Other chronic pain 08/05/2021   Vaccine counseling 08/05/2021   Hypoxemia 07/02/2021   Morbid obesity (HCC)     Short-term memory loss 02/24/2020   Osteopenia 01/22/2020   Aortic atherosclerosis (HCC) 01/22/2020   Insomnia 10/29/2019   Recurrent falls 09/06/2019   Confusion, postoperative    Chronic constipation    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   Muscle spasm    Hypokalemia    Status post lumbar spinal fusion 07/10/2019   Postoperative pain    Labile blood pressure    Hypoalbuminemia due to protein-calorie malnutrition (HCC)    Anxiety 07/05/2019   Depression 07/05/2019   Chronic diastolic CHF (congestive heart failure) (HCC) 07/05/2019   Obesity (BMI 30-39.9) 07/05/2019   Lumbar radiculopathy 06/28/2019   Spinal stenosis of lumbar region 06/28/2019   Tremors of nervous system    Chronic pain syndrome    Osteomyelitis of lumbar spine (HCC) 06/25/2019   Muscular deconditioning    Nausea and vomiting 06/24/2019   History of sepsis 06/24/2019   Recent major surgery 06/24/2019   History of diverticulosis 06/24/2019   Elevated PTHrP level 05/09/2019   Low back pain 02/12/2019   Neuropathy 12/20/2018   Lumbar spinal stenosis 06/29/2018   Abnormal CT of the chest 06/04/2018   Hypothyroidism 11/30/2017   Thoracic aortic atherosclerosis (HCC) 09/14/2016   RLS (restless legs syndrome) 09/09/2016   H/O adenomatous polyp of colon 07/06/2016   PVC's (premature ventricular contractions) 03/29/2016   Grade I diastolic dysfunction 02/01/2016   Former cigarette smoker 01/11/2016   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   Leg edema 01/11/2016   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   History of left breast cancer 07/14/2015   AKI (acute kidney injury) (HCC) 07/08/2015   Dependent edema  06/08/2015   Sleep disturbance 06/08/2015   Mixed hyperlipidemia 06/08/2015   Generalized anxiety disorder 06/08/2015   Essential hypertension 06/08/2015   COPD (chronic obstructive pulmonary disease) (HCC) 06/08/2015   Gout 06/08/2015   Macrocytic anemia    Sedative hypnotic withdrawal  (HCC)    Lumbar spondylosis 03/20/2015    Orientation RESPIRATION BLADDER Height & Weight     Self, Time, Situation, Place  Normal Continent Weight: 162 lb 7.7 oz (73.7 kg) Height:  5' 7.5 (171.5 cm)  BEHAVIORAL SYMPTOMS/MOOD NEUROLOGICAL BOWEL NUTRITION STATUS      Continent Diet (Carb modified diet)  AMBULATORY STATUS COMMUNICATION OF NEEDS Skin   Limited Assist Verbally Other (Comment) (Ecchymosis: bilateral legs)                       Personal Care Assistance Level of Assistance  Bathing, Feeding, Dressing Bathing Assistance: Limited assistance Feeding assistance: Independent Dressing Assistance: Limited assistance     Functional Limitations Info  Sight, Hearing, Speech Sight Info: Impaired Hearing Info: Adequate Speech Info: Adequate    SPECIAL CARE FACTORS FREQUENCY  PT (By licensed PT), OT (By licensed OT)     PT Frequency: 5x's/week OT Frequency: 5x's/week            Contractures Contractures Info: Not present    Additional Factors Info  Code Status, Allergies Code Status Info: Full Allergies Info: Ampicillin , Gabapentin , Penicillins, Percocet (Oxycodone -acetaminophen ), Crestor (Rosuvastatin), Hydrocodone , Ozempic  (0.25 Or 0.5 Mg-dose) (Semaglutide (0.25 Or 0.5mg -dos)), Other           Current Medications (12/15/2023):  This is the current hospital active medication list Current Facility-Administered Medications  Medication Dose Route Frequency Provider Last Rate Last Admin   0.9 %  sodium chloride  infusion   Intravenous Continuous Ghimire, Kuber, MD 100 mL/hr at 12/14/23 1153 New Bag at 12/14/23 1153   allopurinol  (ZYLOPRIM ) tablet 300 mg  300 mg Oral QHS Amponsah, Prosper M, MD   300 mg at 12/14/23 2118   amitriptyline  (ELAVIL ) tablet 25 mg  25 mg Oral QHS Amponsah, Prosper M, MD   25 mg at 12/14/23 2116   bisacodyl  (DULCOLAX) EC tablet 5 mg  5 mg Oral Daily PRN Amponsah, Prosper M, MD       cefadroxil  (DURICEF) capsule 500 mg  500 mg Oral BID  Amponsah, Prosper M, MD   500 mg at 12/14/23 2115   cholecalciferol  (VITAMIN D3) 25 MCG (1000 UNIT) tablet 2,000 Units  2,000 Units Oral Daily Amponsah, Prosper M, MD   2,000 Units at 12/15/23 1047   enoxaparin  (LOVENOX ) injection 40 mg  40 mg Subcutaneous Q24H Amponsah, Prosper M, MD   40 mg at 12/15/23 1048   ibuprofen  (ADVIL ) tablet 400 mg  400 mg Oral Q8H Utomwen, Adesuwa, RPH   400 mg at 12/15/23 0518   insulin  aspart (novoLOG ) injection 0-15 Units  0-15 Units Subcutaneous TID WC Amponsah, Prosper M, MD   2 Units at 12/13/23 1219   levothyroxine  (SYNTHROID ) tablet 75 mcg  75 mcg Oral Q0600 Lou Claretta HERO, MD   75 mcg at 12/15/23 0518   [START ON 12/16/2023] magnesium  oxide (MAG-OX) tablet 400 mg  400 mg Oral Daily Raenelle Coria, MD       magnesium  sulfate IVPB 2 g 50 mL  2 g Intravenous Once Ghimire, Kuber, MD       methocarbamol  (ROBAXIN ) tablet 500 mg  500 mg Oral Q6H PRN Amponsah, Prosper M, MD   500 mg at 12/14/23 [id number]  midodrine  (PROAMATINE ) tablet 2.5 mg  2.5 mg Oral BID WC Ghimire, Kuber, MD   2.5 mg at 12/15/23 1047   ondansetron  (ZOFRAN ) tablet 4 mg  4 mg Oral Q6H PRN Amponsah, Prosper M, MD   4 mg at 12/14/23 2117   Or   ondansetron  (ZOFRAN ) injection 4 mg  4 mg Intravenous Q6H PRN Amponsah, Prosper M, MD       pantoprazole  (PROTONIX ) EC tablet 40 mg  40 mg Oral BID Amponsah, Prosper M, MD   40 mg at 12/15/23 1047   pregabalin  (LYRICA ) capsule 50 mg  50 mg Oral TID Ghimire, Kuber, MD   50 mg at 12/15/23 1048   senna-docusate (Senokot-S) tablet 1 tablet  1 tablet Oral QHS PRN Lou Claretta HERO, MD       traZODone  (DESYREL ) tablet 50 mg  50 mg Oral QHS PRN Amponsah, Prosper M, MD   50 mg at 12/14/23 2242     Discharge Medications: Please see discharge summary for a list of discharge medications.  Relevant Imaging Results:  Relevant Lab Results:   Additional Information SSN: [id number]  Duwaine GORMAN Aran, LCSW

## 2023-12-15 NOTE — Plan of Care (Signed)
  Problem: Education: Goal: Knowledge of General Education information will improve Description: Including pain rating scale, medication(s)/side effects and non-pharmacologic comfort measures Outcome: Progressing   Problem: Nutrition: Goal: Adequate nutrition will be maintained Outcome: Progressing   Problem: Coping: Goal: Level of anxiety will decrease Outcome: Progressing   Problem: Pain Managment: Goal: General experience of comfort will improve and/or be controlled Outcome: Progressing   Problem: Safety: Goal: Ability to remain free from injury will improve Outcome: Progressing   Problem: Skin Integrity: Goal: Risk for impaired skin integrity will decrease Outcome: Progressing   Problem: Skin Integrity: Goal: Risk for impaired skin integrity will decrease Outcome: Progressing   Problem: Tissue Perfusion: Goal: Adequacy of tissue perfusion will improve Outcome: Progressing

## 2023-12-16 DIAGNOSIS — R531 Weakness: Secondary | ICD-10-CM | POA: Diagnosis not present

## 2023-12-16 LAB — GLUCOSE, CAPILLARY
Glucose-Capillary: 157 mg/dL — ABNORMAL HIGH (ref 70–99)
Glucose-Capillary: 137 mg/dL — ABNORMAL HIGH (ref 70–99)

## 2023-12-16 LAB — ACTH STIMULATION, 3 TIME POINTS
Cortisol, 30 Min: 13.8 ug/dL
Cortisol, 60 Min: 21.6 ug/dL
Cortisol, Base: 3.6 ug/dL

## 2023-12-16 NOTE — Progress Notes (Signed)
 PROGRESS NOTE    Brandi Stephens  FMW:[id number] DOB: [birth year] DOA: 12/12/2023 PCP: Bulah Alm RAMAN, PA-C    Brief Narrative:  78 year old with history of chronic diastolic heart failure, hypertension, hyperlipidemia, GERD, spinal fusion and chronic back pain and osteomyelitis on suppressive antibiotic therapy, anxiety and depression, recently diagnosed transaminitis under investigation presented to the ER with multiple falls, dizziness and lightheadedness, chronic back pain.  Describes episodes of blackout and fall.  In the emergency room hemodynamically stable.  Blood pressure stable.  Renal functions normal.  Bilirubin, AST and ALT elevated but comparable to outpatient.  EKG with bradycardia heart rate 48.  Chest x-ray normal.  CT head normal.  Right upper quadrant ultrasound with contracted gallbladder.   Patient was found to have significant orthostatic hypotension, acute on chronic dizziness with postural changes.  Clinically improving.  Awaiting to go to SNF.   Subjective: Patient seen and examined.  Daughter at the bedside.  She feels pretty comfortable laying in the bed.  Still intermittently gets dizzy without orthostatic drop.  She is more confident to gradually walk.  Learning to do exercises in the bed.  Unfortunately, her husband is also admitted with COPD. No other overnight events.  She has less confusion and getting better sleep last night.  Assessment & Plan:   Multiple falls, severe orthostatic symptoms with history of orthostatic hypotension: Discontinued muscle relaxants.  Continue Lyrica . TED hose. Midodrine  2.5 mg twice daily.  Will accept supine hypertension in order to have room for orthostatic drop. All-time orthostatic precautions.  Continue mobility with PT OT. A.m. cortisol level is low, not diagnostic.  Patient may have adrenocortical insufficiency.  ACTH  suppression test pending.  Transaminitis, abnormal LFT: Recently been worked up as outpatient.   Continue follow-up.  LFTs are improving.  Chronic back pain, status post spinal fusion, chronic osteomyelitis of the L-spine: Patient on chronic suppressive therapy with cefadroxil .  Continue.  Allergic to Tylenol .  Will use ibuprofen . Already on Lyrica .  Pain controlled.  Hypothyroidism: On Synthroid .  TSH is less than 0.1.  Decrease thyroxine from 75 mcg-50 mcg.  GERD: On PPI.  Gout, on allopurinol .  Hypomagnesemia: Replaced.  Also keep on his scheduled oral magnesium .  Confusion, hallucination: Hospital-acquired delirium.  Fall precautions.  Delirium precautions.  Avoid benzodiazepines.  Symptomatic management.  Improving.     DVT prophylaxis: Place TED hose Start: 12/13/23 1008 enoxaparin  (LOVENOX ) injection 40 mg Start: 12/13/23 1000   Code Status: Full code Family Communication: None today. Disposition Plan: Status is: Inpatient.   Needs SNF.  Medically stable to transfer to SNF if bed available.   Consultants:  None  Procedures:  None  Antimicrobials:  Cefadroxil , chronic suppressive therapy     Objective: Vitals:   12/15/23 1408 12/15/23 2204 12/16/23 0455 12/16/23 1407  BP: (!) 163/109 (!) 176/117 (!) 178/126 (!) 158/111  Pulse:    (!) 126  Resp:  16 18   Temp: 98.2 F (36.8 C) 98.6 F (37 C) 98.2 F (36.8 C) 98.4 F (36.9 C)  TempSrc: Oral Oral Oral Oral  SpO2:  91% 92% 93%  Weight:      Height:        Intake/Output Summary (Last 24 hours) at 12/16/2023 1501 Last data filed at 12/16/2023 1053 Gross per 24 hour  Intake 240 ml  Output --  Net 240 ml   Filed Weights   12/13/23 0000  Weight: 73.7 kg    Examination:  General exam: Comfortable.  Pleasant interaction. Respiratory system:  Clear to auscultation. Respiratory effort normal. Cardiovascular system: S1 & S2 heard, RRR. No JVD, murmurs, rubs, gallops or clicks.  Trace bilateral pedal edema. Gastrointestinal system: Soft.  Nontender.  Bowel sounds present. Central nervous system: Alert  and awake.  Mostly oriented.  Intermittent hallucinations reported to the medical staff.  Alert awake and oriented.  Pleasant to conversation today.     Data Reviewed: I have personally reviewed following labs and imaging studies  CBC: Recent Labs  Lab 12/12/23 1602 12/13/23 0147 12/15/23 0641  WBC 7.1 6.7 5.6  NEUTROABS  --   --  3.0  HGB 13.9 15.2* 13.2  HCT 41.5 46.1* 40.8  MCV 96.1 97.1 97.6  PLT 117* 104* 118*   Basic Metabolic Panel: Recent Labs  Lab 12/12/23 1602 12/13/23 0147 12/15/23 0641  NA 140 142 146*  K 3.5 3.7 3.5  CL 101 102 112*  CO2 26 24 23   GLUCOSE 178* 125* 101*  BUN 22 21 12   CREATININE 1.15* 0.96 0.84  CALCIUM  9.6 9.7 9.0  MG  --  1.7 1.5*  PHOS  --  2.6  --    GFR: Estimated Creatinine Clearance: 55.6 mL/min (by C-G formula based on SCr of 0.84 mg/dL). Liver Function Tests: Recent Labs  Lab 12/12/23 1602 12/13/23 0617 12/15/23 0641  AST 426* 385* 263*  ALT 414* 374* 293*  ALKPHOS 108 101 94  BILITOT 3.3* 2.8* 3.8*  PROT 5.0* 4.7* 4.4*  ALBUMIN  3.2* 3.0* 2.9*   No results for input(s): LIPASE, AMYLASE in the last 168 hours. Recent Labs  Lab 12/14/23 1428  AMMONIA 65*   Coagulation Profile: No results for input(s): INR, PROTIME in the last 168 hours. Cardiac Enzymes: No results for input(s): CKTOTAL, CKMB, CKMBINDEX, TROPONINI in the last 168 hours. BNP (last 3 results) No results for input(s): PROBNP in the last 8760 hours. HbA1C: No results for input(s): HGBA1C in the last 72 hours.  CBG: Recent Labs  Lab 12/15/23 0716 12/15/23 1144 12/15/23 1706 12/15/23 2206 12/16/23 1158  GLUCAP 94 119* 133* 134* 157*   Lipid Profile: No results for input(s): CHOL, HDL, LDLCALC, TRIG, CHOLHDL, LDLDIRECT in the last 72 hours. Thyroid  Function Tests: Recent Labs    12/14/23 1428  TSH <0.100*   Anemia Panel: Recent Labs    12/14/23 1428  VITAMINB12 2,346*   Sepsis Labs: No results for  input(s): PROCALCITON, LATICACIDVEN in the last 168 hours.  No results found for this or any previous visit (from the past 240 hours).       Radiology Studies: No results found.       Scheduled Meds:  allopurinol   300 mg Oral QHS   amitriptyline   25 mg Oral QHS   cefadroxil   500 mg Oral BID   cholecalciferol   2,000 Units Oral Daily   enoxaparin  (LOVENOX ) injection  40 mg Subcutaneous Q24H   ibuprofen   400 mg Oral Q8H   insulin  aspart  0-15 Units Subcutaneous TID WC   levothyroxine   50 mcg Oral Q0600   magnesium  oxide  400 mg Oral Daily   midodrine   2.5 mg Oral BID WC   pantoprazole   40 mg Oral BID   pregabalin   50 mg Oral TID   Continuous Infusions:     LOS: 2 days    Time spent: 40 minutes    Renato Applebaum, MD [facility]

## 2023-12-16 NOTE — Plan of Care (Signed)
   Problem: Education: Goal: Knowledge of General Education information will improve Description Including pain rating scale, medication(s)/side effects and non-pharmacologic comfort measures Outcome: Progressing   Problem: Health Behavior/Discharge Planning: Goal: Ability to manage health-related needs will improve Outcome: Progressing

## 2023-12-16 NOTE — TOC Progression Note (Signed)
 Transition of Care [facility]) - Progression Note    Patient Details  Name: Brandi Stephens MRN: [id number] Date of Birth: 05/21/1945  Transition of Care [facility]) CM/SW Contact  Sheri ONEIDA [place], [place] Phone Number: 12/16/2023, 4:00 PM  Clinical Narrative:    Bed offers presented to pt dtr. Dtr requested additional time to review. CSW to f/u on bed choice.   Expected Discharge Plan: Skilled Nursing Facility Barriers to Discharge: Continued Medical Work up, SNF Pending bed offer, English as a second language teacher               Expected Discharge Plan and Services In-house Referral: Clinical Social Work   Post Acute Care Choice: Skilled Nursing Facility Living arrangements for the past 2 months: Single Family Home                 DME Arranged: N/A DME Agency: NA                   Social Drivers of Health (SDOH) Interventions SDOH Screenings   Food Insecurity: No Food Insecurity (12/13/2023)  Housing: Low Risk  (12/13/2023)  Transportation Needs: No Transportation Needs (12/13/2023)  Utilities: Not At Risk (12/13/2023)  Alcohol  Screen: Low Risk  (05/02/2023)  Depression (PHQ2-9): Low Risk  (06/26/2023)  Financial Resource Strain: Low Risk  (05/02/2023)  Physical Activity: Inactive (05/02/2023)  Social Connections: Socially Integrated (12/13/2023)  Stress: Stress Concern Present (05/02/2023)  Tobacco Use: Medium Risk (12/12/2023)  Health Literacy: Adequate Health Literacy (05/02/2023)    Readmission Risk Interventions    12/16/2023    3:59 PM 06/23/2022   10:55 AM  Readmission Risk Prevention Plan  Post Dischage Appt Complete   Medication Screening Complete   Transportation Screening Complete Complete  PCP or Specialist Appt within 3-5 Days  Complete  HRI or Home Care Consult  Complete  Social Work Consult for Recovery Care Planning/Counseling  Complete  Palliative Care Screening  Not Applicable

## 2023-12-17 DIAGNOSIS — R531 Weakness: Secondary | ICD-10-CM | POA: Diagnosis not present

## 2023-12-17 LAB — GLUCOSE, CAPILLARY
Glucose-Capillary: 101 mg/dL — ABNORMAL HIGH (ref 70–99)
Glucose-Capillary: 115 mg/dL — ABNORMAL HIGH (ref 70–99)
Glucose-Capillary: 187 mg/dL — ABNORMAL HIGH (ref 70–99)
Glucose-Capillary: 88 mg/dL (ref 70–99)
Glucose-Capillary: 111 mg/dL — ABNORMAL HIGH (ref 70–99)

## 2023-12-17 MED ORDER — PREDNISONE 5 MG PO TABS
5.0000 mg | ORAL_TABLET | Freq: Every day | ORAL | Status: DC
  Administered 2023-12-17 – 2023-12-21 (×5): 5 mg via ORAL
  Filled 2023-12-17 (×5): qty 1

## 2023-12-17 NOTE — Progress Notes (Signed)
 PROGRESS NOTE    Brandi Stephens  FMW:[id number] DOB: 03-Aug-1945 DOA: 12/12/2023 PCP: Bulah Alm RAMAN, PA-C    Brief Narrative:  78 year old with history of chronic diastolic heart failure, hypertension, hyperlipidemia, GERD, spinal fusion and chronic back pain and osteomyelitis on suppressive antibiotic therapy, anxiety and depression, recently diagnosed transaminitis under investigation presented to the ER with multiple falls, dizziness and lightheadedness, chronic back pain.  Describes episodes of blackout and fall.  In the emergency room hemodynamically stable.  Blood pressure stable.  Renal functions normal.  Bilirubin, AST and ALT elevated but comparable to outpatient.  EKG with bradycardia heart rate 48.  Chest x-ray normal.  CT head normal.  Right upper quadrant ultrasound with contracted gallbladder.   Patient was found to have significant orthostatic hypotension, acute on chronic dizziness with postural changes.  Clinically improving.  Awaiting to go to SNF.   Subjective: Patient seen and examined.  Today she was able to get out of the bed go to the bathroom and get back to her chair without getting dizziness.  Looks comfortable and pleasant today.  Very excited about able to walk without symptoms.  Assessment & Plan:   Multiple falls, severe orthostatic symptoms with history of orthostatic hypotension: Muscle relaxants discontinued.  Lyrica  continued. TED hose all the time as possible. Midodrine  2.5 mg twice daily.  Will accept supine hypertension in order to have room for orthostatic drop. All-time orthostatic precautions.  Continue mobility with PT OT. A.m. cortisol less than 2.  ACTH  stimulation test with borderline low.  Potassium is normal.  Will start patient on prednisone  5 mg daily and will refer to endocrine on discharge.  Transaminitis, abnormal LFT: Recently been worked up as outpatient.  Continue follow-up.  LFTs are improving.  Chronic back pain, status post  spinal fusion, chronic osteomyelitis of the L-spine: Patient on chronic suppressive therapy with cefadroxil .  Continue.  Allergic to Tylenol .  Will use ibuprofen . Already on Lyrica .  Pain controlled.  Hypothyroidism: On Synthroid .  TSH is less than 0.1.  Decrease thyroxine from 75 mcg-50 mcg.  Will need to recheck in 1 month.  GERD: On PPI.  Gout, on allopurinol .  Hypomagnesemia: Replaced.  Also keep on his scheduled oral magnesium .  Confusion, hallucination: Hospital-acquired delirium.  Fall precautions.  Delirium precautions.  Avoid benzodiazepines.  Symptomatic management.  Improving.     DVT prophylaxis: Place TED hose Start: 12/13/23 1008 enoxaparin  (LOVENOX ) injection 40 mg Start: 12/13/23 1000   Code Status: Full code Family Communication: Daughter at the bedside. Disposition Plan: Status is: Inpatient.   Needs SNF.  Medically stable to transfer to SNF if bed available.   Consultants:  None  Procedures:  None  Antimicrobials:  Cefadroxil , chronic suppressive therapy     Objective: Vitals:   12/16/23 1900 12/16/23 2000 12/16/23 2048 12/17/23 0435  BP:   (!) 156/111 (!) 175/112  Pulse:    (!) 107  Resp: 14 20 18 16   Temp:   98.3 F (36.8 C) 98.2 F (36.8 C)  TempSrc:   Oral Oral  SpO2:   95% 94%  Weight:      Height:        Intake/Output Summary (Last 24 hours) at 12/17/2023 1203 Last data filed at 12/17/2023 1000 Gross per 24 hour  Intake 840 ml  Output 1 ml  Net 839 ml   Filed Weights   12/13/23 0000  Weight: 73.7 kg    Examination:  General exam: Pleasant and interactive.  Pretty comfortable today.  Respiratory system: Clear to auscultation. Respiratory effort normal. Cardiovascular system: S1 & S2 heard, RRR. No JVD, murmurs, rubs, gallops or clicks.  Gastrointestinal system: Soft.  Nontender.  Bowel sounds present. Central nervous system: Alert and awake.  Mostly oriented.   Alert awake and oriented.  Pleasant to conversation today. No  delusions or hallucinations.     Data Reviewed: I have personally reviewed following labs and imaging studies  CBC: Recent Labs  Lab 12/12/23 1602 12/13/23 0147 12/15/23 0641  WBC 7.1 6.7 5.6  NEUTROABS  --   --  3.0  HGB 13.9 15.2* 13.2  HCT 41.5 46.1* 40.8  MCV 96.1 97.1 97.6  PLT 117* 104* 118*   Basic Metabolic Panel: Recent Labs  Lab 12/12/23 1602 12/13/23 0147 12/15/23 0641  NA 140 142 146*  K 3.5 3.7 3.5  CL 101 102 112*  CO2 26 24 23   GLUCOSE 178* 125* 101*  BUN 22 21 12   CREATININE 1.15* 0.96 0.84  CALCIUM  9.6 9.7 9.0  MG  --  1.7 1.5*  PHOS  --  2.6  --    GFR: Estimated Creatinine Clearance: 55.6 mL/min (by C-G formula based on SCr of 0.84 mg/dL). Liver Function Tests: Recent Labs  Lab 12/12/23 1602 12/13/23 0617 12/15/23 0641  AST 426* 385* 263*  ALT 414* 374* 293*  ALKPHOS 108 101 94  BILITOT 3.3* 2.8* 3.8*  PROT 5.0* 4.7* 4.4*  ALBUMIN  3.2* 3.0* 2.9*   No results for input(s): LIPASE, AMYLASE in the last 168 hours. Recent Labs  Lab 12/14/23 1428  AMMONIA 65*   Coagulation Profile: No results for input(s): INR, PROTIME in the last 168 hours. Cardiac Enzymes: No results for input(s): CKTOTAL, CKMB, CKMBINDEX, TROPONINI in the last 168 hours. BNP (last 3 results) No results for input(s): PROBNP in the last 8760 hours. HbA1C: No results for input(s): HGBA1C in the last 72 hours.  CBG: Recent Labs  Lab 12/15/23 2206 12/16/23 1158 12/16/23 1719 12/17/23 0751 12/17/23 1119  GLUCAP 134* 157* 137* 187* 88   Lipid Profile: No results for input(s): CHOL, HDL, LDLCALC, TRIG, CHOLHDL, LDLDIRECT in the last 72 hours. Thyroid  Function Tests: Recent Labs    12/14/23 1428  TSH <0.100*   Anemia Panel: Recent Labs    12/14/23 1428  VITAMINB12 2,346*   Sepsis Labs: No results for input(s): PROCALCITON, LATICACIDVEN in the last 168 hours.  No results found for this or any previous visit (from  the past 240 hours).       Radiology Studies: No results found.       Scheduled Meds:  allopurinol   300 mg Oral QHS   amitriptyline   25 mg Oral QHS   cefadroxil   500 mg Oral BID   cholecalciferol   2,000 Units Oral Daily   enoxaparin  (LOVENOX ) injection  40 mg Subcutaneous Q24H   ibuprofen   400 mg Oral Q8H   insulin  aspart  0-15 Units Subcutaneous TID WC   levothyroxine   50 mcg Oral Q0600   magnesium  oxide  400 mg Oral Daily   midodrine   2.5 mg Oral BID WC   pantoprazole   40 mg Oral BID   predniSONE   5 mg Oral Q breakfast   pregabalin   50 mg Oral TID   Continuous Infusions:     LOS: 3 days    Time spent: 40 minutes    Renato Applebaum, MD [facility]

## 2023-12-17 NOTE — Progress Notes (Signed)
 Blood glucose checked results have not updated in system,  CBG 187 @ 0800; 83 @ 1200; 111 @1600 

## 2023-12-17 NOTE — Plan of Care (Signed)

## 2023-12-17 NOTE — Plan of Care (Signed)

## 2023-12-18 DIAGNOSIS — R531 Weakness: Secondary | ICD-10-CM | POA: Diagnosis not present

## 2023-12-18 LAB — GLUCOSE, CAPILLARY
Glucose-Capillary: 104 mg/dL — ABNORMAL HIGH (ref 70–99)
Glucose-Capillary: 93 mg/dL (ref 70–99)
Glucose-Capillary: 136 mg/dL — ABNORMAL HIGH (ref 70–99)
Glucose-Capillary: 177 mg/dL — ABNORMAL HIGH (ref 70–99)

## 2023-12-18 NOTE — Progress Notes (Signed)
 Physical Therapy Treatment Patient Details Name: MAZEL VILLELA MRN: [id number] DOB: 12-03-1945 Today's Date: 12/18/2023   History of Present Illness 78 y.o. female admitted 12/12/23 due to generalized weakness, dizziness, and multiple falls. PMH includes chronic diastolic heart failure, HTN, HLD, GERD, DDD s/p spinal fusion, chronic back pain, gout, hypothyroidism, breast cancer s/p lumpectomy, gastroparesis, anxiety and depression, osteomyelitis of the lumbar spine on suppressive antibiotics    PT Comments  The patient resting in bed,  some difficulty texting a phone message. Patient quite disheveled.  Patient reports that her spouse is in the hospital and they would like to go to the same rehab facility.  Assisted patient to ambulate to BR then recliner, quite significant knee crepitus and very flexed trunk. Patient will benefit from continued inpatient follow up therapy, <3 hours/day.    If plan is discharge home, recommend the following: A little help with walking and/or transfers;A little help with bathing/dressing/bathroom;Assistance with cooking/housework;Assist for transportation;Help with stairs or ramp for entrance   Can travel by private vehicle        Equipment Recommendations  None recommended by PT    Recommendations for Other Services       Precautions / Restrictions Precautions Precautions: Fall Recall of Precautions/Restrictions: Impaired Restrictions Weight Bearing Restrictions Per Provider Order: No     Mobility  Bed Mobility Overal bed mobility: Needs Assistance Bed Mobility: Supine to Sit     Supine to sit: Supervision          Transfers Overall transfer level: Needs assistance Equipment used: Rolling walker (2 wheels) Transfers: Sit to/from Stand Sit to Stand: Min assist, Contact guard assist           General transfer comment: CGA from bed, min from toilet, very audible crepitus in the knees.    Ambulation/Gait Ambulation/Gait  assistance: Min assist, Contact guard assist Gait Distance (Feet): 15 Feet (then 25) Assistive device: Rolling walker (2 wheels) Gait Pattern/deviations: Step-through pattern, Trunk flexed Gait velocity: decreased     General Gait Details: slight trunk flexion with min cues for posture, pt requried CGA to min assist the farther distance walked, trunk even more flexed at end distance.   Stairs             Wheelchair Mobility     Tilt Bed    Modified Rankin (Stroke Patients Only)       Balance   Sitting-balance support: Feet supported Sitting balance-Leahy Scale: Good Sitting balance - Comments: unchallenged, posterior preference initially   Standing balance support: Reliant on assistive device for balance, During functional activity, Bilateral upper extremity supported Standing balance-Leahy Scale: Poor Standing balance comment: steady support after toileting and performing self hygien, balance loss x 1, required steady support, most likely due to  knee instability.                            Communication Communication Communication: No apparent difficulties  Cognition Arousal: Alert Behavior During Therapy: WFL for tasks assessed/performed   PT - Cognitive impairments: Difficult to assess, Awareness                       PT - Cognition Comments: patient required extra time to type mesage on  her phone. Patient notes to  quite disheveled, lots of spilled food. Assisted with simple hygiene while in BR. Following commands: Intact      Cueing Cueing Techniques: Verbal cues  Exercises  General Comments        Pertinent Vitals/Pain Pain Assessment Pain Assessment: No/denies pain    Home Living                          Prior Function            PT Goals (current goals can now be found in the care plan section) Progress towards PT goals: Progressing toward goals    Frequency    Min 3X/week      PT Plan       Co-evaluation              AM-PAC PT 6 Clicks Mobility   Outcome Measure  Help needed turning from your back to your side while in a flat bed without using bedrails?: None Help needed moving from lying on your back to sitting on the side of a flat bed without using bedrails?: None Help needed moving to and from a bed to a chair (including a wheelchair)?: A Little Help needed standing up from a chair using your arms (e.g., wheelchair or bedside chair)?: A Little Help needed to walk in hospital room?: A Little Help needed climbing 3-5 steps with a railing? : A Lot 6 Click Score: 19    End of Session Equipment Utilized During Treatment: Gait belt Activity Tolerance: Patient tolerated treatment well Patient left: with call bell/phone within reach;in chair;with nursing/sitter in room Nurse Communication: Mobility status PT Visit Diagnosis: Muscle weakness (generalized) (M62.81);Difficulty in walking, not elsewhere classified (R26.2)     Time: 9052-8984 PT Time Calculation (min) (ACUTE ONLY): 28 min  Charges:    $Gait Training: 8-22 mins $Self Care/Home Management: 8-22 PT General Charges $$ ACUTE PT VISIT: 1 Visit                     Darice Potters PT Acute Rehabilitation Services Office [phone]   Potters Darice Norris 12/18/2023, 1:03 PM

## 2023-12-18 NOTE — TOC Progression Note (Signed)
 Transition of Care [facility]) - Progression Note    Patient Details  Name: Brandi Stephens MRN: [id number] Date of Birth: [birth year]  Transition of Care [facility]) CM/SW Contact  Heather DELENA Saltness, LCSW Phone Number: 12/18/2023, 12:13 PM  Clinical Narrative:    Insurance authorization submitted SNF at [place], currently pending. TOC will continue to follow.   Expected Discharge Plan: Skilled Nursing Facility Barriers to Discharge: Continued Medical Work up, SNF Pending bed offer, English as a second language teacher   Expected Discharge Plan and Services In-house Referral: Clinical Social Work   Post Acute Care Choice: Skilled Nursing Facility Living arrangements for the past 2 months: Single Family Home                 DME Arranged: N/A DME Agency: NA    Social Drivers of Health (SDOH) Interventions SDOH Screenings   Food Insecurity: No Food Insecurity (12/13/2023)  Housing: Low Risk  (12/13/2023)  Transportation Needs: No Transportation Needs (12/13/2023)  Utilities: Not At Risk (12/13/2023)  Alcohol  Screen: Low Risk  (05/02/2023)  Depression (PHQ2-9): Low Risk  (06/26/2023)  Financial Resource Strain: Low Risk  (05/02/2023)  Physical Activity: Inactive (05/02/2023)  Social Connections: Socially Integrated (12/13/2023)  Stress: Stress Concern Present (05/02/2023)  Tobacco Use: Medium Risk (12/12/2023)  Health Literacy: Adequate Health Literacy (05/02/2023)    Readmission Risk Interventions    12/16/2023    3:59 PM 06/23/2022   10:55 AM  Readmission Risk Prevention Plan  Post Dischage Appt Complete   Medication Screening Complete   Transportation Screening Complete Complete  PCP or Specialist Appt within 3-5 Days  Complete  HRI or Home Care Consult  Complete  Social Work Consult for Recovery Care Planning/Counseling  Complete  Palliative Care Screening  Not Applicable    Signed: Heather Saltness, MSW, LCSW Clinical Social Worker Inpatient Care Management 12/18/2023 12:14 PM

## 2023-12-18 NOTE — Progress Notes (Signed)
 PROGRESS NOTE    Brandi Stephens  FMW:[id number] DOB: [birth year] DOA: 12/12/2023 PCP: Bulah Alm RAMAN, PA-C    Brief Narrative:  78 year old with history of chronic diastolic heart failure, hypertension, hyperlipidemia, GERD, spinal fusion and chronic back pain and osteomyelitis on suppressive antibiotic therapy, anxiety and depression, recently diagnosed transaminitis under investigation presented to the ER with multiple falls, dizziness and lightheadedness, chronic back pain.  Describes episodes of blackout and fall.  In the emergency room hemodynamically stable.  Blood pressure stable.  Renal functions normal.  Bilirubin, AST and ALT elevated but comparable to outpatient.  EKG with bradycardia heart rate 48.  Chest x-ray normal.  CT head normal.  Right upper quadrant ultrasound with contracted gallbladder.   Patient was found to have significant orthostatic hypotension, acute on chronic dizziness with postural changes.  Clinically improving.  Awaiting to go to SNF.   Subjective: Patient was seen and examined.  No overnight events.  Significant difficulty with mobilizing.  Today she was slightly confused as she thought her daughter is at the bedside.  Assessment & Plan:   Multiple falls, severe orthostatic symptoms with history of orthostatic hypotension: Muscle relaxants discontinued.  Lyrica  continued. TED hose all the time as possible. Midodrine  2.5 mg twice daily.  Will accept supine hypertension in order to have room for orthostatic drop. All-time orthostatic precautions.  Continue mobility with PT OT. A.m. cortisol less than 2.  ACTH  stimulation test with borderline low.  Potassium is normal.  Will start patient on prednisone  5 mg daily and will refer to endocrine on discharge.  Transaminitis, abnormal LFT: Recently been worked up as outpatient.  Continue follow-up.  LFTs are improving.  Chronic back pain, status post spinal fusion, chronic osteomyelitis of the L-spine: Patient  on chronic suppressive therapy with cefadroxil .  Continue.  Allergic to Tylenol .  Will use ibuprofen . Already on Lyrica .  Pain controlled.  Hypothyroidism: On Synthroid .  TSH is less than 0.1.  Decrease thyroxine from 75 mcg-50 mcg.  Will need to recheck in 1 month.  GERD: On PPI.  Gout, on allopurinol .  Hypomagnesemia: Replaced.  Also keep on his scheduled oral magnesium .  Confusion, hallucination: Hospital-acquired delirium.  Fall precautions.  Delirium precautions.  Avoid benzodiazepines.  Symptomatic management.  Improving.     DVT prophylaxis: Place TED hose Start: 12/13/23 1008 enoxaparin  (LOVENOX ) injection 40 mg Start: 12/13/23 1000   Code Status: Full code Family Communication: None today. Disposition Plan: Status is: Inpatient.   Needs SNF.  Medically stable to transfer to SNF if bed available.   Consultants:  None  Procedures:  None  Antimicrobials:  Cefadroxil , chronic suppressive therapy     Objective: Vitals:   12/17/23 1539 12/17/23 2117 12/18/23 0607 12/18/23 1209  BP: (!) 166/114 (!) 154/114 (!) 165/103 (!) 109/97  Pulse: (!) 116  (!) 108   Resp: 18 18 16 17   Temp:  98.3 F (36.8 C) (!) 97.3 F (36.3 C) 98.1 F (36.7 C)  TempSrc:  Oral Oral   SpO2: 93% 93% 100% 93%  Weight:      Height:        Intake/Output Summary (Last 24 hours) at 12/18/2023 1323 Last data filed at 12/18/2023 0900 Gross per 24 hour  Intake 480 ml  Output --  Net 480 ml   Filed Weights   12/13/23 0000  Weight: 73.7 kg    Examination:  General exam: Slightly anxious.  Sitting in chair. Respiratory system: Clear to auscultation. Respiratory effort normal. Cardiovascular system: S1 &  S2 heard, RRR. No JVD, murmurs, rubs, gallops or clicks.  Gastrointestinal system: Soft.  Nontender.  Bowel sounds present. Central nervous system: Alert and awake.  Mostly oriented.   Alert awake and oriented.  Pleasant to conversation today.     Data Reviewed: I have personally  reviewed following labs and imaging studies  CBC: Recent Labs  Lab 12/12/23 1602 12/13/23 0147 12/15/23 0641  WBC 7.1 6.7 5.6  NEUTROABS  --   --  3.0  HGB 13.9 15.2* 13.2  HCT 41.5 46.1* 40.8  MCV 96.1 97.1 97.6  PLT 117* 104* 118*   Basic Metabolic Panel: Recent Labs  Lab 12/12/23 1602 12/13/23 0147 12/15/23 0641  NA 140 142 146*  K 3.5 3.7 3.5  CL 101 102 112*  CO2 26 24 23   GLUCOSE 178* 125* 101*  BUN 22 21 12   CREATININE 1.15* 0.96 0.84  CALCIUM  9.6 9.7 9.0  MG  --  1.7 1.5*  PHOS  --  2.6  --    GFR: Estimated Creatinine Clearance: 55.6 mL/min (by C-G formula based on SCr of 0.84 mg/dL). Liver Function Tests: Recent Labs  Lab 12/12/23 1602 12/13/23 0617 12/15/23 0641  AST 426* 385* 263*  ALT 414* 374* 293*  ALKPHOS 108 101 94  BILITOT 3.3* 2.8* 3.8*  PROT 5.0* 4.7* 4.4*  ALBUMIN  3.2* 3.0* 2.9*   No results for input(s): LIPASE, AMYLASE in the last 168 hours. Recent Labs  Lab 12/14/23 1428  AMMONIA 65*   Coagulation Profile: No results for input(s): INR, PROTIME in the last 168 hours. Cardiac Enzymes: No results for input(s): CKTOTAL, CKMB, CKMBINDEX, TROPONINI in the last 168 hours. BNP (last 3 results) No results for input(s): PROBNP in the last 8760 hours. HbA1C: No results for input(s): HGBA1C in the last 72 hours.  CBG: Recent Labs  Lab 12/16/23 2049 12/17/23 0751 12/17/23 1119 12/17/23 1613 12/18/23 1120  GLUCAP 115* 187* 88 111* 136*   Lipid Profile: No results for input(s): CHOL, HDL, LDLCALC, TRIG, CHOLHDL, LDLDIRECT in the last 72 hours. Thyroid  Function Tests: No results for input(s): TSH, T4TOTAL, FREET4, T3FREE, THYROIDAB in the last 72 hours.  Anemia Panel: No results for input(s): VITAMINB12, FOLATE, FERRITIN, TIBC, IRON, RETICCTPCT in the last 72 hours.  Sepsis Labs: No results for input(s): PROCALCITON, LATICACIDVEN in the last 168 hours.  No results found  for this or any previous visit (from the past 240 hours).       Radiology Studies: No results found.       Scheduled Meds:  allopurinol   300 mg Oral QHS   amitriptyline   25 mg Oral QHS   cefadroxil   500 mg Oral BID   cholecalciferol   2,000 Units Oral Daily   enoxaparin  (LOVENOX ) injection  40 mg Subcutaneous Q24H   ibuprofen   400 mg Oral Q8H   insulin  aspart  0-15 Units Subcutaneous TID WC   levothyroxine   50 mcg Oral Q0600   magnesium  oxide  400 mg Oral Daily   midodrine   2.5 mg Oral BID WC   pantoprazole   40 mg Oral BID   predniSONE   5 mg Oral Q breakfast   pregabalin   50 mg Oral TID   Continuous Infusions:     LOS: 4 days    Time spent: 40 minutes    Renato Applebaum, MD [facility]

## 2023-12-18 NOTE — Plan of Care (Signed)
  Problem: Clinical Measurements: Goal: Will remain free from infection Outcome: Progressing Goal: Respiratory complications will improve Outcome: Progressing   Problem: Elimination: Goal: Will not experience complications related to bowel motility Outcome: Progressing Goal: Will not experience complications related to urinary retention Outcome: Progressing   Problem: Nutritional: Goal: Progress toward achieving an optimal weight will improve Outcome: Progressing

## 2023-12-19 DIAGNOSIS — R531 Weakness: Secondary | ICD-10-CM | POA: Diagnosis not present

## 2023-12-19 LAB — GLUCOSE, CAPILLARY
Glucose-Capillary: 101 mg/dL — ABNORMAL HIGH (ref 70–99)
Glucose-Capillary: 70 mg/dL (ref 70–99)
Glucose-Capillary: 108 mg/dL — ABNORMAL HIGH (ref 70–99)
Glucose-Capillary: 118 mg/dL — ABNORMAL HIGH (ref 70–99)
Glucose-Capillary: 88 mg/dL (ref 70–99)

## 2023-12-19 NOTE — Plan of Care (Signed)

## 2023-12-19 NOTE — Progress Notes (Signed)
 Occupational Therapy Treatment Patient Details Name: Brandi Stephens MRN: [id number] DOB: September 29, 1945 Today's Date: 12/19/2023   History of present illness 78 y.o. female admitted 12/12/23 due to generalized weakness, dizziness, and multiple falls. PMH includes chronic diastolic heart failure, HTN, HLD, GERD, DDD s/p spinal fusion, chronic back pain, gout, hypothyroidism, breast cancer s/p lumpectomy, gastroparesis, anxiety and depression, osteomyelitis of the lumbar spine on suppressive antibiotics   OT comments  Patient seen for skilled OT session this afternoon. Patient open to all therapy presented with no reports of pain and + VSR and tolerance for ADL's seated with intermittent standing from bed then to recliner with RW. Education for pacing and cues needed for higher level attention, safety and sequencing. Patient requires continued Acute care hospital level OT services to progress safety and functional performance and allow for discharge. Based on current functional level and need for increased family assist with spouse currently hospitalized as well, patient will benefit from continued inpatient follow up therapy, <3 hours/day.         If plan is discharge home, recommend the following:  A lot of help with walking and/or transfers;A lot of help with bathing/dressing/bathroom;Assistance with cooking/housework;Assist for transportation;Help with stairs or ramp for entrance;Supervision due to cognitive status   Equipment Recommendations  BSC/3in1       Precautions / Restrictions Precautions Precautions: Fall Recall of Precautions/Restrictions: Impaired Restrictions Weight Bearing Restrictions Per Provider Order: No       Mobility Bed Mobility Overal bed mobility: Needs Assistance Bed Mobility: Supine to Sit Rolling: Modified independent (Device/Increase time) Sidelying to sit: Used rails, Supervision Supine to sit: Supervision     General bed mobility comments: increased time  to organize body and task    Transfers Overall transfer level: Needs assistance Equipment used: Rolling walker (2 wheels) Transfers: Sit to/from Stand, Bed to chair/wheelchair/BSC Sit to Stand: Min assist, Contact guard assist     Step pivot transfers: Min assist     General transfer comment: cues for hand placement and upright trunk     Balance Overall balance assessment: Needs assistance Sitting-balance support: Feet supported Sitting balance-Leahy Scale: Good   Postural control: Right lateral lean, Left lateral lean Standing balance support: Reliant on assistive device for balance, During functional activity, Bilateral upper extremity supported Standing balance-Leahy Scale: Poor Standing balance comment: cues and assist for dynamic ADL tasks                           ADL either performed or assessed with clinical judgement   ADL Overall ADL's : Needs assistance/impaired Eating/Feeding: Set up;Sitting   Grooming: Wash/dry hands;Wash/dry face;Oral care;Applying deodorant;Brushing hair;Sitting;Set up;Cueing for sequencing Grooming Details (indicate cue type and reason): recliner level Upper Body Bathing: Contact guard assist;Sitting   Lower Body Bathing: Minimal assistance;Sit to/from stand;Cueing for sequencing;Cueing for safety   Upper Body Dressing : Contact guard assist;Sitting   Lower Body Dressing: Minimal assistance;Sit to/from stand;Cueing for sequencing;Cueing for safety Lower Body Dressing Details (indicate cue type and reason): RW for UE support in standing Toilet Transfer: Minimal assistance;BSC/3in1;Rolling walker (2 wheels);Cueing for sequencing   Toileting- Clothing Manipulation and Hygiene: Contact guard assist;Sitting/lateral lean       Functional mobility during ADLs: Minimal assistance;Rolling walker (2 wheels) General ADL Comments: cues for safety, sequencing routine    Extremity/Trunk Assessment Upper Extremity Assessment Upper  Extremity Assessment: Generalized weakness;Right hand dominant   Lower Extremity Assessment Lower Extremity Assessment: Defer to PT evaluation  Vision   Vision Assessment?: No apparent visual deficits         Communication Communication Communication: No apparent difficulties   Cognition Arousal: Alert Behavior During Therapy: WFL for tasks assessed/performed Cognition: Cognition impaired   Orientation impairments: Time Awareness: Online awareness impaired Memory impairment (select all impairments): Short-term memory Attention impairment (select first level of impairment): Selective attention Executive functioning impairment (select all impairments): Problem solving, Sequencing OT - Cognition Comments: tangential but able to follow basic task sequencing wiht min cues                 Following commands: Intact        Cueing   Cueing Techniques: Verbal cues        General Comments BP 122/88 in standing with RW 1-2 minutes    Pertinent Vitals/ Pain       Pain Assessment Pain Assessment: No/denies pain   Frequency  Min 2X/week        Progress Toward Goals  OT Goals(current goals can now be found in the care plan section)  Progress towards OT goals: Progressing toward goals  Acute Rehab OT Goals Patient Stated Goal: to get to rehab OT Goal Formulation: With patient Time For Goal Achievement: 12/27/23 Potential to Achieve Goals: Good ADL Goals Pt Will Perform Eating: with modified independence;with adaptive utensils;sitting Pt Will Perform Grooming: sitting;with set-up Pt Will Perform Upper Body Dressing: with set-up;sitting Pt Will Perform Lower Body Dressing: with supervision;sit to/from stand Pt Will Transfer to Toilet: with supervision;ambulating Pt Will Perform Toileting - Clothing Manipulation and hygiene: with modified independence;sitting/lateral leans Additional ADL Goal #1: Pt will verbalize at least 3 energy conservation strategies  for ADL  Plan         AM-PAC OT 6 Clicks Daily Activity     Outcome Measure   Help from another person eating meals?: A Little Help from another person taking care of personal grooming?: A Little Help from another person toileting, which includes using toliet, bedpan, or urinal?: A Little Help from another person bathing (including washing, rinsing, drying)?: A Lot Help from another person to put on and taking off regular upper body clothing?: A Little Help from another person to put on and taking off regular lower body clothing?: A Lot 6 Click Score: 16    End of Session Equipment Utilized During Treatment: Gait belt;Rolling walker (2 wheels)  OT Visit Diagnosis: Unsteadiness on feet (R26.81);History of falling (Z91.81);Muscle weakness (generalized) (M62.81)   Activity Tolerance Patient tolerated treatment well   Patient Left in chair;with call bell/phone within reach;with chair alarm set   Nurse Communication Mobility status;Other (comment) (lunch ordered)        Time: 8786-8757 OT Time Calculation (min): 29 min  Charges: OT General Charges $OT Visit: 1 Visit OT Treatments $Self Care/Home Management : 23-37 mins  Olyvia Gopal OT/L Acute Rehabilitation Department  [phone]  12/19/2023, 1:10 PM

## 2023-12-19 NOTE — TOC Progression Note (Addendum)
 Transition of Care [facility]) - Progression Note    Patient Details  Name: Brandi Stephens MRN: [id number] Date of Birth: 1945/10/05  Transition of Care [facility]) CM/SW Contact  Heather DELENA Saltness, LCSW Phone Number: 12/19/2023, 10:29 AM  Clinical Narrative:     ADDENDUM  3:09 PM - CSW spoke with pt's daughter, [name] [phone], via phone call to discuss pt's discharge to [facility] tomorrow. Daughter reports she will transport pt to facility. Daughter inquired if pt can visit with husband who's in ICU. CSW notified CSW covering ICU.  Pt's insurance authorization for SNF rehab at [facility] has been approved. Auth certificate number: [id number], valid through Monday 9/8. CSW sent message to [place] [facility] [place].   Expected Discharge Plan: Skilled Nursing Facility Barriers to Discharge: Continued Medical Work up, SNF Pending bed offer, English as a second language teacher   Expected Discharge Plan and Services In-house Referral: Clinical Social Work   Post Acute Care Choice: Skilled Nursing Facility Living arrangements for the past 2 months: Single Family Home                 DME Arranged: N/A DME Agency: NA     Social Drivers of Health (SDOH) Interventions SDOH Screenings   Food Insecurity: No Food Insecurity (12/13/2023)  Housing: Low Risk  (12/13/2023)  Transportation Needs: No Transportation Needs (12/13/2023)  Utilities: Not At Risk (12/13/2023)  Alcohol  Screen: Low Risk  (05/02/2023)  Depression (PHQ2-9): Low Risk  (06/26/2023)  Financial Resource Strain: Low Risk  (05/02/2023)  Physical Activity: Inactive (05/02/2023)  Social Connections: Socially Integrated (12/13/2023)  Stress: Stress Concern Present (05/02/2023)  Tobacco Use: Medium Risk (12/12/2023)  Health Literacy: Adequate Health Literacy (05/02/2023)    Readmission Risk Interventions    12/16/2023    3:59 PM 06/23/2022   10:55 AM  Readmission Risk Prevention Plan  Post Dischage Appt Complete   Medication Screening Complete    Transportation Screening Complete Complete  PCP or Specialist Appt within 3-5 Days  Complete  HRI or Home Care Consult  Complete  Social Work Consult for Recovery Care Planning/Counseling  Complete  Palliative Care Screening  Not Applicable    Signed: Heather Saltness, MSW, LCSW Clinical Social Worker Inpatient Care Management 12/19/2023 10:30 AM

## 2023-12-19 NOTE — Plan of Care (Signed)
   Problem: Clinical Measurements: Goal: Ability to maintain clinical measurements within normal limits will improve Outcome: Progressing

## 2023-12-19 NOTE — Progress Notes (Signed)
 PROGRESS NOTE    Brandi Stephens  FMW:[id number] DOB: Nov 15, 1945 DOA: 12/12/2023 PCP: Bulah Alm RAMAN, PA-C    Brief Narrative:  78 year old with history of chronic diastolic heart failure, hypertension, hyperlipidemia, GERD, spinal fusion and chronic back pain and chronic osteomyelitis on suppressive antibiotic therapy, anxiety and depression, recently diagnosed transaminitis under investigation presented to the ER with multiple falls, dizziness and lightheadedness, chronic back pain.  Describes episodes of blackout and fall.  In the emergency room hemodynamically stable.  Blood pressure stable.  Renal functions normal.  Bilirubin, AST and ALT elevated but comparable to outpatient.  EKG with bradycardia heart rate 48.  Chest x-ray normal.  CT head normal.  Right upper quadrant ultrasound with contracted gallbladder.   Patient was found to have significant orthostatic hypotension, acute on chronic dizziness with postural changes.  Clinically improving.  Awaiting to go to SNF.   Subjective:  Patient seen and examined.  Seen again had some hallucination last night but she is able to tell me that that was her confusion.  Currently denies any complaints.  He still gets dizziness intermittently however able to walk to the bathroom with support.  Blood pressures has been stable without any orthostatic drop today.  Assessment & Plan:   Multiple falls, severe orthostatic symptoms with history of orthostatic hypotension: Muscle relaxants discontinued.  Lyrica  continued. TED hose all the time as possible. Midodrine  2.5 mg twice daily.  Will accept supine hypertension in order to have room for orthostatic drop. All-time orthostatic precautions.  Continue mobility with PT OT. A.m. cortisol less than 2.  ACTH  stimulation test with normal findings.  Potassium is normal.   Due to persistent orthostatics, trial of prednisone  5 mg daily.  Ambulatory referral to endocrine sent.  Transaminitis, abnormal  LFT: Recently been worked up as outpatient.  Continue follow-up.  LFTs are improving.  Chronic back pain, status post spinal fusion, chronic osteomyelitis of the L-spine: Patient on chronic suppressive therapy with cefadroxil .  Continue.  Allergic to Tylenol .  Will use ibuprofen . Already on Lyrica .  Pain controlled.  Hypothyroidism: On Synthroid .  TSH is less than 0.1.  Decrease thyroxine from 75 mcg-50 mcg.  Will need to recheck in 1 month.  GERD: On PPI.  Gout, on allopurinol .  Hypomagnesemia: Replaced.  Also keep on his scheduled oral magnesium .  Confusion, hallucination: Hospital-acquired delirium.  Fall precautions.  Delirium precautions.  Avoid benzodiazepines.  Symptomatic management.  Improving.     DVT prophylaxis: Place TED hose Start: 12/13/23 1008 enoxaparin  (LOVENOX ) injection 40 mg Start: 12/13/23 1000   Code Status: Full code Family Communication: Daughter on the phone. Disposition Plan: Status is: Inpatient.   Needs SNF.  Medically stable to transfer to SNF if bed available.   Consultants:  None  Procedures:  None  Antimicrobials:  Cefadroxil , chronic suppressive therapy     Objective: Vitals:   12/18/23 2000 12/19/23 0507 12/19/23 1141 12/19/23 1200  BP: (!) 163/106 (!) 165/120 (!) 148/107   Pulse: (!) 109 100 (!) 115 (!) 102  Resp: 18 18 16    Temp: 98.6 F (37 C) 97.6 F (36.4 C) 98.2 F (36.8 C)   TempSrc: Oral Oral Oral   SpO2: 95% 98% 91%   Weight:      Height:        Intake/Output Summary (Last 24 hours) at 12/19/2023 1335 Last data filed at 12/19/2023 0900 Gross per 24 hour  Intake 120 ml  Output --  Net 120 ml   [employer]  12/13/23 0000  Weight: 73.7 kg    Examination:  General exam: Looked comfortable.  Anxious.  Pleasant to interaction. Respiratory system: Clear to auscultation. Respiratory effort normal. Cardiovascular system: S1 & S2 heard, RRR. No JVD, murmurs, rubs, gallops or clicks.  Gastrointestinal system: Soft.   Nontender.  Bowel sounds present. Central nervous system: Alert and awake.  Mostly oriented.   Alert awake and oriented.  Pleasant to conversation today.     Data Reviewed: I have personally reviewed following labs and imaging studies  CBC: Recent Labs  Lab 12/12/23 1602 12/13/23 0147 12/15/23 0641  WBC 7.1 6.7 5.6  NEUTROABS  --   --  3.0  HGB 13.9 15.2* 13.2  HCT 41.5 46.1* 40.8  MCV 96.1 97.1 97.6  PLT 117* 104* 118*   Basic Metabolic Panel: Recent Labs  Lab 12/12/23 1602 12/13/23 0147 12/15/23 0641  NA 140 142 146*  K 3.5 3.7 3.5  CL 101 102 112*  CO2 26 24 23   GLUCOSE 178* 125* 101*  BUN 22 21 12   CREATININE 1.15* 0.96 0.84  CALCIUM  9.6 9.7 9.0  MG  --  1.7 1.5*  PHOS  --  2.6  --    GFR: Estimated Creatinine Clearance: 55.6 mL/min (by C-G formula based on SCr of 0.84 mg/dL). Liver Function Tests: Recent Labs  Lab 12/12/23 1602 12/13/23 0617 12/15/23 0641  AST 426* 385* 263*  ALT 414* 374* 293*  ALKPHOS 108 101 94  BILITOT 3.3* 2.8* 3.8*  PROT 5.0* 4.7* 4.4*  ALBUMIN  3.2* 3.0* 2.9*   No results for input(s): LIPASE, AMYLASE in the last 168 hours. Recent Labs  Lab 12/14/23 1428  AMMONIA 65*   Coagulation Profile: No results for input(s): INR, PROTIME in the last 168 hours. Cardiac Enzymes: No results for input(s): CKTOTAL, CKMB, CKMBINDEX, TROPONINI in the last 168 hours. BNP (last 3 results) No results for input(s): PROBNP in the last 8760 hours. HbA1C: No results for input(s): HGBA1C in the last 72 hours.  CBG: Recent Labs  Lab 12/18/23 1120 12/18/23 1658 12/18/23 2123 12/19/23 0738 12/19/23 1140  GLUCAP 136* 177* 101* 70 108*   Lipid Profile: No results for input(s): CHOL, HDL, LDLCALC, TRIG, CHOLHDL, LDLDIRECT in the last 72 hours. Thyroid  Function Tests: No results for input(s): TSH, T4TOTAL, FREET4, T3FREE, THYROIDAB in the last 72 hours.  Anemia Panel: No results for input(s):  VITAMINB12, FOLATE, FERRITIN, TIBC, IRON, RETICCTPCT in the last 72 hours.  Sepsis Labs: No results for input(s): PROCALCITON, LATICACIDVEN in the last 168 hours.  No results found for this or any previous visit (from the past 240 hours).       Radiology Studies: No results found.       Scheduled Meds:  allopurinol   300 mg Oral QHS   amitriptyline   25 mg Oral QHS   cefadroxil   500 mg Oral BID   cholecalciferol   2,000 Units Oral Daily   enoxaparin  (LOVENOX ) injection  40 mg Subcutaneous Q24H   ibuprofen   400 mg Oral Q8H   insulin  aspart  0-15 Units Subcutaneous TID WC   levothyroxine   50 mcg Oral Q0600   magnesium  oxide  400 mg Oral Daily   midodrine   2.5 mg Oral BID WC   pantoprazole   40 mg Oral BID   predniSONE   5 mg Oral Q breakfast   pregabalin   50 mg Oral TID   Continuous Infusions:     LOS: 5 days    Time spent: 40 minutes  Renato Applebaum, MD [facility]

## 2023-12-20 ENCOUNTER — Telehealth: Payer: Self-pay | Admitting: Gastroenterology

## 2023-12-20 DIAGNOSIS — I951 Orthostatic hypotension: Secondary | ICD-10-CM

## 2023-12-20 DIAGNOSIS — R531 Weakness: Secondary | ICD-10-CM | POA: Diagnosis not present

## 2023-12-20 DIAGNOSIS — R7989 Other specified abnormal findings of blood chemistry: Secondary | ICD-10-CM | POA: Diagnosis not present

## 2023-12-20 LAB — COMPREHENSIVE METABOLIC PANEL WITH GFR
ALT: 170 U/L — ABNORMAL HIGH (ref 0–44)
AST: 111 U/L — ABNORMAL HIGH (ref 15–41)
Albumin: 3.6 g/dL (ref 3.5–5.0)
Alkaline Phosphatase: 93 U/L (ref 38–126)
Anion gap: 11 (ref 5–15)
BUN: 26 mg/dL — ABNORMAL HIGH (ref 8–23)
CO2: 27 mmol/L (ref 22–32)
Calcium: 10.3 mg/dL (ref 8.9–10.3)
Chloride: 108 mmol/L (ref 98–111)
Creatinine, Ser: 1.03 mg/dL — ABNORMAL HIGH (ref 0.44–1.00)
GFR, Estimated: 56 mL/min — ABNORMAL LOW (ref >60)
Glucose, Bld: 110 mg/dL — ABNORMAL HIGH (ref 70–99)
Potassium: 3.4 mmol/L — ABNORMAL LOW (ref 3.5–5.1)
Sodium: 145 mmol/L (ref 135–145)
Total Bilirubin: 2.5 mg/dL — ABNORMAL HIGH (ref 0.0–1.2)
Total Protein: 5.6 g/dL — ABNORMAL LOW (ref 6.5–8.1)

## 2023-12-20 LAB — CBC WITH DIFFERENTIAL/PLATELET
Abs Immature Granulocytes: 0.1 K/uL — ABNORMAL HIGH (ref 0.00–0.07)
Basophils Absolute: 0.1 K/uL (ref 0.0–0.1)
Basophils Relative: 2 %
Eosinophils Absolute: 0.5 K/uL (ref 0.0–0.5)
Eosinophils Relative: 6 %
HCT: 46.6 % — ABNORMAL HIGH (ref 36.0–46.0)
Hemoglobin: 15.2 g/dL — ABNORMAL HIGH (ref 12.0–15.0)
Immature Granulocytes: 1 %
Lymphocytes Relative: 24 %
Lymphs Abs: 1.7 K/uL (ref 0.7–4.0)
MCH: 32.6 pg (ref 26.0–34.0)
MCHC: 32.6 g/dL (ref 30.0–36.0)
MCV: 100 fL (ref 80.0–100.0)
Monocytes Absolute: 0.6 K/uL (ref 0.1–1.0)
Monocytes Relative: 9 %
Neutro Abs: 4.2 K/uL (ref 1.7–7.7)
Neutrophils Relative %: 58 %
Platelets: 190 K/uL (ref 150–400)
RBC: 4.66 MIL/uL (ref 3.87–5.11)
RDW: 15.7 % — ABNORMAL HIGH (ref 11.5–15.5)
WBC: 7.2 K/uL (ref 4.0–10.5)
nRBC: 0 % (ref 0.0–0.2)

## 2023-12-20 LAB — GLUCOSE, CAPILLARY
Glucose-Capillary: 82 mg/dL (ref 70–99)
Glucose-Capillary: 116 mg/dL — ABNORMAL HIGH (ref 70–99)
Glucose-Capillary: 158 mg/dL — ABNORMAL HIGH (ref 70–99)
Glucose-Capillary: 155 mg/dL — ABNORMAL HIGH (ref 70–99)

## 2023-12-20 LAB — MAGNESIUM: Magnesium: 1.7 mg/dL (ref 1.7–2.4)

## 2023-12-20 LAB — PHOSPHORUS: Phosphorus: 2.8 mg/dL (ref 2.5–4.6)

## 2023-12-20 MED ORDER — POTASSIUM CHLORIDE CRYS ER 20 MEQ PO TBCR
40.0000 meq | EXTENDED_RELEASE_TABLET | Freq: Two times a day (BID) | ORAL | Status: AC
  Administered 2023-12-20 – 2023-12-21 (×2): 40 meq via ORAL
  Filled 2023-12-20 (×2): qty 2

## 2023-12-20 MED ORDER — MAGNESIUM SULFATE 2 GM/50ML IV SOLN
2.0000 g | Freq: Once | INTRAVENOUS | Status: AC
  Administered 2023-12-20: 2 g via INTRAVENOUS
  Filled 2023-12-20: qty 50

## 2023-12-20 NOTE — Plan of Care (Signed)

## 2023-12-20 NOTE — Telephone Encounter (Signed)
 Dr Leigh-  I have cancelled patients upcoming appointment for 12/22/23 with Brandi Stephens. This was to follow up on abnormal LFT's/discuss possible liver biopsy. [name], patient's daughter wanted to make you aware that the hospitalist took a big look at [facility] liver and wanted you to take a look at those records.   Also, assuming patient may be in rehab over the next few weeks, when would you like me to reschedule her office visit?

## 2023-12-20 NOTE — Telephone Encounter (Signed)
 Lab reviewed - her LFTs continue to downtrend. Not sure if she had a prior drug reaction or shock liver. If she is going to rehab she will need her LFTs checked periodically. As long as they continue to downtrend we can monitor. I'd probably recheck in 1-2 weeks post discharge from the hospital, while in rehab, as long as they are stable while she is in the hospital. Can rebook her to see me in 4-6 weeks if that works for her? Thanks

## 2023-12-20 NOTE — Progress Notes (Signed)
 Physical Therapy Treatment Patient Details Name: Brandi Stephens MRN: [id number] DOB: 1945/06/28 Today's Date: 12/20/2023   History of Present Illness 78 y.o. female admitted 12/12/23 due to generalized weakness, dizziness, and multiple falls. PMH includes chronic diastolic heart failure, HTN, HLD, GERD, DDD s/p spinal fusion, chronic back pain, gout, hypothyroidism, breast cancer s/p lumpectomy, gastroparesis, anxiety and depression, osteomyelitis of the lumbar spine on suppressive antibiotics    PT Comments  Appears AxO pleasant and willing but also presents with some difficulty with expressing self and word finding.  She is following all commands but with increased time to process. A  little fuzzy on her history of most recent events.  She did share that she graduated from [employer] and tought at [employer] setting. Assisted OOB while taking vitals. Supine     BP 156/95(112)  HR 105 EOB         BP 151/104(120) HR 102 slight dizzy Standing  BP 123/110(101)  HR 106 Pt was incont loose, watery BM.  Quickly assisted to [facility]. Pt wearing a full brief and despite VC's to pull down your garment, Pt continued to sit on BSC.  Required extra assist for turn completion and VC's on proper hand placeemnt to steady self.  Assisted with peri care as Pt was unable to self perform and maintain a safe standing balance.General Gait Details: Poor forward flexed posture, head downward and unsteady gait of short, shuffled steps and L leg drag.  VC's for a partial upright posture and increased assist to complete turns and target bed/BSC.  Pt wanting to sit too quickly due to fatigue.  Impaired safety awareness. Assisted back to bed per Pt request to rest.  Was OOB in recliner earlier to today. Pt stated she amb with a walker at home because of my back.  LPT has rec Pt will need ST Rehab at SNF to address mobility and functional decline prior to safely returning home. Spouse is also a Pt  here at [facility]    If plan is discharge home, recommend the following: A little help with walking and/or transfers;A little help with bathing/dressing/bathroom;Assistance with cooking/housework;Assist for transportation;Help with stairs or ramp for entrance   Can travel by private vehicle     Yes  Equipment Recommendations  None recommended by PT    Recommendations for Other Services       Precautions / Restrictions Precautions Precautions: Fall Precaution/Restrictions Comments: Hx Orthostatic Hypotension Restrictions Weight Bearing Restrictions Per Provider Order: No     Mobility  Bed Mobility Overal bed mobility: Needs Assistance Bed Mobility: Supine to Sit, Sit to Supine     Supine to sit: Supervision, Contact guard Sit to supine: Contact guard assist, Min assist   General bed mobility comments: increased time to organize her thoughts and assist to complete scooting to EOB as her hips were stuck (sunken) middle of the bed.  Assisted back to bed Pt needed assist B LE up into bed.    Transfers Overall transfer level: Needs assistance Equipment used: Rolling walker (2 wheels), None Transfers: Bed to chair/wheelchair/BSC Sit to Stand: Min assist           General transfer comment: first, assisted from elevated bed to [facility] due to void urgency/incont.  Pt wearing a full brief and despite VC's to pull down your garment, Pt continued to sit on BSC.  Required extra assist for turn completion and VC's on proper hand placeemnt to steady self.  Assisted with peri care  as Pt was unable to self perform and maintain a safe standing balance.    Ambulation/Gait Ambulation/Gait assistance: Min assist, Mod assist Gait Distance (Feet): 42 Feet Assistive device: None, Rolling walker (2 wheels) Gait Pattern/deviations: Step-through pattern, Trunk flexed, Decreased stride length Gait velocity: decreased     General Gait Details: Poor forward flexed posture, head downward and unsteady  gait of short, shuffled steps and L leg drag.  VC's for a partial upright posture and increased assist to complete turns and target bed/BSC.  Pt wanting to sit too quickly due to fatigue.  Impaired safety awareness.   Stairs             Wheelchair Mobility     Tilt Bed    Modified Rankin (Stroke Patients Only)       Balance                                            Communication Communication Communication: No apparent difficulties  Cognition Arousal: Alert     PT - Cognitive impairments: Awareness, Attention, Problem solving, Safety/Judgement                       PT - Cognition Comments: appears AxO pleasant and willing but also presents with some difficulty with expressing self and word finding.  She is following all commands but with increased time to process. A  little fuzzy on her history of most recent events.  She did share that she graduated from [employer] and tought at [employer] setting. Following commands: Intact      Cueing Cueing Techniques: Verbal cues  Exercises      General Comments        Pertinent Vitals/Pain Pain Assessment Pain Assessment: Faces Faces Pain Scale: Hurts a little bit Pain Location: chronic back pain and left shouler Pain Descriptors / Indicators: Aching, Discomfort, Grimacing    Home Living                          Prior Function            PT Goals (current goals can now be found in the care plan section) Progress towards PT goals: Progressing toward goals    Frequency    Min 3X/week      PT Plan      Co-evaluation              AM-PAC PT 6 Clicks Mobility   Outcome Measure  Help needed turning from your back to your side while in a flat bed without using bedrails?: A Little Help needed moving from lying on your back to sitting on the side of a flat bed without using bedrails?: A Little Help needed moving to and from a bed to a  chair (including a wheelchair)?: A Little Help needed standing up from a chair using your arms (e.g., wheelchair or bedside chair)?: A Lot Help needed to walk in hospital room?: A Lot Help needed climbing 3-5 steps with a railing? : Total 6 Click Score: 14    End of Session Equipment Utilized During Treatment: Gait belt Activity Tolerance: Patient limited by fatigue Patient left: in bed;with call bell/phone within reach;with bed alarm set Nurse Communication: Mobility status PT Visit Diagnosis: Muscle weakness (generalized) (M62.81);Difficulty in walking, not elsewhere classified (  R26.2)     Time: 1455-1520 PT Time Calculation (min) (ACUTE ONLY): 25 min  Charges:    $Gait Training: 8-22 mins $Therapeutic Activity: 8-22 mins PT General Charges $$ ACUTE PT VISIT: 1 Visit                     Katheryn Leap  PTA Acute  Rehabilitation Services Office M-F          [phone]

## 2023-12-20 NOTE — Plan of Care (Signed)
  Problem: Education: Goal: Knowledge of General Education information will improve Description: Including pain rating scale, medication(s)/side effects and non-pharmacologic comfort measures Outcome: Progressing   Problem: Health Behavior/Discharge Planning: Goal: Ability to manage health-related needs will improve Outcome: Progressing   Problem: Clinical Measurements: Goal: Ability to maintain clinical measurements within normal limits will improve Outcome: Progressing   Problem: Activity: Goal: Risk for activity intolerance will decrease Outcome: Progressing   Problem: Nutrition: Goal: Adequate nutrition will be maintained Outcome: Progressing   Problem: Pain Managment: Goal: General experience of comfort will improve and/or be controlled Outcome: Progressing   Problem: Safety: Goal: Ability to remain free from injury will improve Outcome: Progressing

## 2023-12-20 NOTE — Progress Notes (Signed)
 PROGRESS NOTE    Brandi Stephens  FMW:[id number] DOB: 04/11/1946 DOA: 12/12/2023 PCP: Bulah Alm RAMAN, PA-C   Brief Narrative:  The patient is a 78 year old Caucasian female with past medical history significant for but not limited to chronic diastolic CHF, essential hypertension, hyperlipidemia, GERD, history of spinal fusion and chronic back pain and chronic osteomyelitis on suppressive antibiotic therapy, anxiety and depression who was recently diagnosed with abnormal LFTs and transaminitis which is being worked up in the outpatient setting presented to the ED with multiple falls, dizziness and lightheadedness as well as chronic back pain.  She described episodes of blacking out and falling.  In the ED she was noted to be hemodynamically stable and LFTs started improving.  She was found to have significant orthostatic hypotension with acute on chronic dizziness with postural changes.  She started clinically improving and currently awaiting to go to SNF.   Assessment and Plan:  Multiple falls, severe orthostatic symptoms with history of orthostatic hypotension: Muscle relaxants discontinued.  Lyrica  continued. TED hose all the time as possible but go to Thigh High. Add Abdominal Binder Midodrine  2.5 mg twice daily.  Will accept supine hypertension in order to have room for orthostatic drop. All-time orthostatic precautions.  Continue mobility with PT OT. A.m. cortisol less than 2.  ACTH  stimulation test with normal findings.  Potassium is normal.   Due to persistent orthostatics, trial of Prednisone  5 mg daily.  Ambulatory referral to endocrine sent. Repeat Orthostatics in the AM   Abnormal LFTs/Transaminitis / Hyperbilirubinemia: Recently been worked up as outpatient.  Continue follow-up.  LFTs are improving. AST and ALT Trend:  Recent Labs  Lab 11/28/23 1636 12/12/23 1602 12/13/23 0617 12/15/23 0641 12/20/23 0957  AST 170* 426* 385* 263* 111*  ALT 204* 414* 374* 293* 170*  BILITOT  1.2 3.3* 2.8* 3.8* 2.5*  ALKPHOS 86 108 101 94 93   Chronic back pain, status post spinal fusion, chronic osteomyelitis of the L-spine: Patient on chronic suppressive therapy with Cefadroxil  500 mg po BID which is being continued.  Allergic to Acetaminophen  so will use Ibuprofen  400 mg po q8h. Already on Lyrica .  Pain controlled.   Hypothyroidism: On Synthroid .  TSH is less than 0.1.  Decrease thyroxine from 75 mcg-50 mcg.  Will need to recheck in 1 month.  Renal Insufficiency: BUN/Cr Trend: Recent Labs  Lab 12/12/23 1602 12/13/23 0147 12/15/23 0641 12/20/23 0957  BUN 22 21 12  26*  CREATININE 1.15* 0.96 0.84 1.03*  -Avoid Nephrotoxic Medications, Contrast Dyes, Hypotension and Dehydration to Ensure Adequate Renal Perfusion and will need to Renally Adjust Meds -Continue to Monitor and Trend Renal Function carefully and repeat CMP in the AM    GERD/GI Prophylaxis: On PPI w/ Pantoprazole  40 mg po BID   Gout: on Allopurinol  300 mg po qHS   Hypomagnesemia: Replaced.  Also keep on his scheduled oral magnesium .  Hypokalemia: K+ was 3.4. Replete w/ po KCL 40 mEQ BID x2. CTM and Replete as Necessary. Repeat CMP in the AM   Confusion, hallucination: Hospital-acquired delirium.  Fall precautions.  Delirium precautions.  Avoid benzodiazepines.  Symptomatic management.  Improving.    DVT prophylaxis: Place TED hose Start: 12/13/23 1008 enoxaparin  (LOVENOX ) injection 40 mg Start: 12/13/23 1000    Code Status: Full Code Family Communication: No family present @ bedside  Disposition Plan:  Level of care: Med-Surg Status is: Inpatient Remains inpatient appropriate because: Needs SNF and repeat Orthostatic VS   Consultants:  None  Procedures:  None  Antimicrobials:  Anti-infectives (From admission, onward)    Start     Dose/Rate Route Frequency Ordered Stop   12/13/23 0130  cefadroxil  (DURICEF) capsule 500 mg        500 mg Oral 2 times daily 12/13/23 0039          Subjective: Seen and examined at bedside.  She thinks she is doing okay.  No nausea or vomiting.  States that no one has brought her an abdominal binder.  Denies any chest pain or shortness of breath.  No other concerns or complaints  Objective: Vitals:   12/20/23 1558 12/20/23 1712 12/20/23 1715 12/20/23 1727  BP: (!) 137/94 (!) 146/100 (!) 133/97 97/75  Pulse: (!) 108 (!) 105 (!) 118 (!) 129  Resp: 18     Temp: 98.3 F (36.8 C)     TempSrc: Oral     SpO2: 94%     Weight:      Height:        Intake/Output Summary (Last 24 hours) at 12/20/2023 1757 Last data filed at 12/20/2023 1735 Gross per 24 hour  Intake 383.64 ml  Output --  Net 383.64 ml   Filed Weights   12/13/23 0000  Weight: 73.7 kg   Examination: Physical Exam:  Constitutional: WN/WD overweight elderly Caucasian female in NAD Respiratory: Diminished to auscultation bilaterally, no wheezing, rales, rhonchi or crackles. Normal respiratory effort and patient is not tachypenic. No accessory muscle use. Unlabored breathing  Cardiovascular: RRR, no murmurs / rubs / gallops. S1 and S2 auscultated. No extremity edema.  Abdomen: Soft, non-tender, Mildly distended 2/2 body habitus. Bowel sounds positive.  GU: Deferred. Musculoskeletal: No clubbing / cyanosis of digits/nails. No joint deformity upper and lower extremities.  Skin: No rashes, lesions, ulcers on a limited skin evaluation. No induration; Warm and dry.  Neurologic: CN 2-12 grossly intact with no focal deficits. Romberg sign and cerebellar reflexes not assessed.  Psychiatric: Awake and Alert  Data Reviewed: I have personally reviewed following labs and imaging studies  CBC: Recent Labs  Lab 12/15/23 0641 12/20/23 0957  WBC 5.6 7.2  NEUTROABS 3.0 4.2  HGB 13.2 15.2*  HCT 40.8 46.6*  MCV 97.6 100.0  PLT 118* 190   Basic Metabolic Panel: Recent Labs  Lab 12/15/23 0641 12/20/23 0957  NA 146* 145  K 3.5 3.4*  CL 112* 108  CO2 23 27  GLUCOSE 101* 110*   BUN 12 26*  CREATININE 0.84 1.03*  CALCIUM  9.0 10.3  MG 1.5* 1.7  PHOS  --  2.8   GFR: Estimated Creatinine Clearance: 45.3 mL/min (A) (by C-G formula based on SCr of 1.03 mg/dL (H)). Liver Function Tests: Recent Labs  Lab 12/15/23 0641 12/20/23 0957  AST 263* 111*  ALT 293* 170*  ALKPHOS 94 93  BILITOT 3.8* 2.5*  PROT 4.4* 5.6*  ALBUMIN  2.9* 3.6   No results for input(s): LIPASE, AMYLASE in the last 168 hours. Recent Labs  Lab 12/14/23 1428  AMMONIA 65*   Coagulation Profile: No results for input(s): INR, PROTIME in the last 168 hours. Cardiac Enzymes: No results for input(s): CKTOTAL, CKMB, CKMBINDEX, TROPONINI in the last 168 hours. BNP (last 3 results) No results for input(s): PROBNP in the last 8760 hours. HbA1C: No results for input(s): HGBA1C in the last 72 hours. CBG: Recent Labs  Lab 12/19/23 1645 12/19/23 2116 12/20/23 0729 12/20/23 1217 12/20/23 1705  GLUCAP 118* 88 82 116* 158*   Lipid Profile: No results for input(s): CHOL, HDL, LDLCALC,  TRIG, CHOLHDL, LDLDIRECT in the last 72 hours. Thyroid  Function Tests: No results for input(s): TSH, T4TOTAL, FREET4, T3FREE, THYROIDAB in the last 72 hours. Anemia Panel: No results for input(s): VITAMINB12, FOLATE, FERRITIN, TIBC, IRON, RETICCTPCT in the last 72 hours. Sepsis Labs: No results for input(s): PROCALCITON, LATICACIDVEN in the last 168 hours.  No results found for this or any previous visit (from the past 240 hours).   Radiology Studies: No results found.  Scheduled Meds:  allopurinol   300 mg Oral QHS   amitriptyline   25 mg Oral QHS   cefadroxil   500 mg Oral BID   cholecalciferol   2,000 Units Oral Daily   enoxaparin  (LOVENOX ) injection  40 mg Subcutaneous Q24H   ibuprofen   400 mg Oral Q8H   insulin  aspart  0-15 Units Subcutaneous TID WC   levothyroxine   50 mcg Oral Q0600   magnesium  oxide  400 mg Oral Daily   midodrine   2.5 mg  Oral BID WC   pantoprazole   40 mg Oral BID   potassium chloride   40 mEq Oral BID   predniSONE   5 mg Oral Q breakfast   pregabalin   50 mg Oral TID   Continuous Infusions:  magnesium  sulfate bolus IVPB 50 mL/hr at 12/20/23 1735    LOS: 6 days   Alejandro Marker, DO [facility] Available via Epic secure chat 7am-7pm After these hours, please refer to coverage provider listed on [email or web address] 12/20/2023, 5:57 PM

## 2023-12-20 NOTE — Hospital Course (Signed)
 The patient is a 78 year old Caucasian female with past medical history significant for but not limited to chronic diastolic CHF, essential hypertension, hyperlipidemia, GERD, history of spinal fusion and chronic back pain and chronic osteomyelitis on suppressive antibiotic therapy, anxiety and depression who was recently diagnosed with abnormal LFTs and transaminitis which is being worked up in the outpatient setting presented to the ED with multiple falls, dizziness and lightheadedness as well as chronic back pain.  She described episodes of blacking out and falling.  In the ED she was noted to be hemodynamically stable and LFTs started improving.  She was found to have significant orthostatic hypotension with acute on chronic dizziness with postural changes.  She started clinically improving and orthostatic VS stable and improved.  Medically stable to be discharged at this time will need to follow-up with PCP, gastroenterology and endocrinology in outpatient setting within 1 to 2 weeks..   Assessment and Plan:  Multiple falls, severe orthostatic symptoms with history of orthostatic hypotension: Muscle relaxants discontinued.  Lyrica  continued. TED hose all the time as possible but change from knee-high to thigh High. Add Abdominal Binder.  Continue with Midodrine  2.5 mg twice daily.  All-time orthostatic precautions.  Continue mobility with PT OT. A.m. cortisol less than 2.  ACTH  stimulation test with normal findings.  Potassium is normal. Due to persistent orthostatics, trial of Prednisone  5 mg daily and will continue for now.  Ambulatory referral to Endocrine sent. Repeat Orthostatics today improved after bolus w/ no symptoms.  She is medically stable to be discharged to SNF at this time.   Abnormal LFTs/Transaminitis / Hyperbilirubinemia: Recently been worked up as outpatient.  Continue follow-up.  LFTs are improving. AST and ALT Trend:  Recent Labs  Lab 11/28/23 1636 12/12/23 1602 12/13/23 0617  12/15/23 0641 12/20/23 0957 12/21/23 0545  AST 170* 426* 385* 263* 111* 83*  ALT 204* 414* 374* 293* 170* 129*  BILITOT 1.2 3.3* 2.8* 3.8* 2.5* 1.7*  ALKPHOS 86 108 101 94 93 79  -CTM and Trend and repeat CMP w/in 1 week and follow up with Gastroenterology   Chronic back pain, status post spinal fusion, chronic osteomyelitis of the L-spine: Patient on chronic suppressive therapy with Cefadroxil  500 mg po BID which is being continued.  Allergic to Acetaminophen  so will use Ibuprofen  400 mg po q8h. Already on Lyrica .  Pain controlled on current regimen.   Hypernatremia: Mild at 146. Placed on D5W for now and can be monitored and repeated in the outpt setting w/in 1 week.   Hypoglycemia: Mild. HbA1c was 5.5. Encourage her to eat and not miss meals. CBG Trend:  Recent Labs  Lab 12/20/23 0729 12/20/23 1217 12/20/23 1705 12/20/23 2230 12/21/23 0750 12/21/23 0812 12/21/23 1151  GLUCAP 82 116* 158* 155* 68* 99 130*  -She is on a Moderate Novolog  SSI AC and will discontinue.   Diarrhea: Mild. Hold Senna-Docusate 1 tab po at bedtime and Bisacodyl  5 mg po Dailyprn. Unlikely Infectious as she is afebrile and has no Leukocytosis. CTM and trail Antidiarrheals. Follow in the outpt setting.   Hypothyroidism: On Synthroid .  TSH is less than 0.1.  Decrease thyroxine from 75 mcg->> 50 mcg.  Will need to recheck in 1 month and has outpatient Endocrinologist referral   Renal Insufficiency: BUN/Cr Trend: Recent Labs  Lab 12/12/23 1602 12/13/23 0147 12/15/23 0641 12/20/23 0957 12/21/23 0545  BUN 22 21 12  26* 24*  CREATININE 1.15* 0.96 0.84 1.03* 0.99  -Avoid Nephrotoxic Medications, Contrast Dyes, Hypotension and Dehydration  to Ensure Adequate Renal Perfusion and will need to Renally Adjust Meds -Continue to Monitor and Trend Renal Function carefully and repeat CMP in the AM    GERD/GI Prophylaxis: On PPI w/ Pantoprazole  40 mg po BID and will continue    Gout: on Allopurinol  300 mg po qHS    Hypomagnesemia: Replaced and Mag is now 2.2.  Also keep on his scheduled oral magnesium .  Hypokalemia: K+ was 3.4 and improved to 3.9. Replete w/ po KCL 40 mEQ BID x2 yesterday. CTM and Replete as Necessary. Repeat CMP in the AM   Confusion, hallucinations: Resolved. Hospital-acquired delirium.  Fall precautions.  Delirium precautions.  Avoid benzodiazepines.  Symptomatic management.  Improving and back to baseline as she is A and O x3

## 2023-12-20 NOTE — Telephone Encounter (Signed)
 Inbound call from patients daughter stating that she needed to cancel patients appointment on 9/5 due to patient being in the hospital. She states when she is discharged she will be going to a short term rehab home.  She was unsure about rescheduling because she did not know how soon patient needed to be seen or if it was okay to schedule in November. Patient daughter is requesting a call to discuss. Please advise.

## 2023-12-20 NOTE — TOC Progression Note (Signed)
 Transition of Care [facility]) - Progression Note    Patient Details  Name: Brandi Stephens MRN: [id number] Date of Birth: 10/10/1945  Transition of Care [facility]) CM/SW Contact  Heather DELENA Saltness, LCSW Phone Number: 12/20/2023, 10:49 AM  Clinical Narrative:    CSW spoke with [place] at [facility], who reports pt can discharge to [facility] for SNF tomorrow 9/4.   Expected Discharge Plan: Skilled Nursing Facility Barriers to Discharge: Barriers Resolved   Expected Discharge Plan and Services In-house Referral: Clinical Social Work   Post Acute Care Choice: Skilled Nursing Facility Living arrangements for the past 2 months: Single Family Home                 DME Arranged: N/A DME Agency: NA       HH Arranged: NA HH Agency: NA         Social Drivers of Health (SDOH) Interventions SDOH Screenings   Food Insecurity: No Food Insecurity (12/13/2023)  Housing: Low Risk  (12/13/2023)  Transportation Needs: No Transportation Needs (12/13/2023)  Utilities: Not At Risk (12/13/2023)  Alcohol  Screen: Low Risk  (05/02/2023)  Depression (PHQ2-9): Low Risk  (06/26/2023)  Financial Resource Strain: Low Risk  (05/02/2023)  Physical Activity: Inactive (05/02/2023)  Social Connections: Socially Integrated (12/13/2023)  Stress: Stress Concern Present (05/02/2023)  Tobacco Use: Medium Risk (12/12/2023)  Health Literacy: Adequate Health Literacy (05/02/2023)    Readmission Risk Interventions    12/16/2023    3:59 PM 06/23/2022   10:55 AM  Readmission Risk Prevention Plan  Post Dischage Appt Complete   Medication Screening Complete   Transportation Screening Complete Complete  PCP or Specialist Appt within 3-5 Days  Complete  HRI or Home Care Consult  Complete  Social Work Consult for Recovery Care Planning/Counseling  Complete  Palliative Care Screening  Not Applicable    Signed: Heather Saltness, MSW, LCSW Clinical Social Worker Inpatient Care Management 12/20/2023 10:50 AM

## 2023-12-20 NOTE — TOC Transition Note (Incomplete)
 Transition of Care [facility]) - Discharge Note   Patient Details  Name: Brandi Stephens MRN: [id number] Date of Birth: [birth year]  Transition of Care [facility]) CM/SW Contact:  Heather DELENA Saltness, LCSW Phone Number: 12/20/2023, 9:48 AM   Clinical Narrative:    Pt discharging to [facility] for short-term rehab today. Pt accepted to room . D/C packet placed in pt's chart at RN station. RN to call report to . Daughter, Bonne Whack, to transport pt to facility. Pt and daughter in agreement with discharge plan. No further TOC needs at this time.   Final next level of care: Skilled Nursing Facility Barriers to Discharge: Barriers Resolved   Patient Goals and CMS Choice Patient states their goals for this hospitalization and ongoing recovery are:: To go to [facility].gov Compare Post Acute Care list provided to:: Patient Represenative (must comment) (Daughter) Choice offered to / list presented to : Adult Children [place] ownership interest in [facility]provided to:: Adult Children    Discharge Placement      Patient chooses bed at: [facility] Patient to be transferred to facility by: Daughter Name of family member notified: Troi Bechtold Patient and family notified of of transfer: 12/20/23  Discharge Plan and Services Additional resources added to the After Visit Summary for  Follow Up In-house Referral: Clinical Social Work   Post Acute Care Choice: Skilled Nursing Facility          DME Arranged: N/A DME Agency: NA       HH Arranged: NA HH Agency: NA        Social Drivers of Health (SDOH) Interventions SDOH Screenings   Food Insecurity: No Food Insecurity (12/13/2023)  Housing: Low Risk  (12/13/2023)  Transportation Needs: No Transportation Needs (12/13/2023)  Utilities: Not At Risk (12/13/2023)  Alcohol  Screen: Low Risk  (05/02/2023)  Depression (PHQ2-9): Low Risk  (06/26/2023)  Financial Resource Strain: Low Risk  (05/02/2023)  Physical Activity:  Inactive (05/02/2023)  Social Connections: Socially Integrated (12/13/2023)  Stress: Stress Concern Present (05/02/2023)  Tobacco Use: Medium Risk (12/12/2023)  Health Literacy: Adequate Health Literacy (05/02/2023)     Readmission Risk Interventions    12/16/2023    3:59 PM 06/23/2022   10:55 AM  Readmission Risk Prevention Plan  Post Dischage Appt Complete   Medication Screening Complete   Transportation Screening Complete Complete  PCP or Specialist Appt within 3-5 Days  Complete  HRI or Home Care Consult  Complete  Social Work Consult for Recovery Care Planning/Counseling  Complete  Palliative Care Screening  Not Applicable

## 2023-12-20 NOTE — Progress Notes (Signed)
   12/20/23 0151  Vitals  Temp 99 F (37.2 C)  Temp Source Oral  BP (!) 169/109  MAP (mmHg) 127  BP Location Right Arm  BP Method Automatic  Patient Position (if appropriate) Lying  Pulse Rate (!) 112  Pulse Rate Source Monitor  Resp 18  MEWS COLOR  MEWS Score Color Yellow  Oxygen  Therapy  SpO2 94 %  O2 Device Room Air  MEWS Score  MEWS Temp 0  MEWS Systolic 0  MEWS Pulse 2  MEWS RR 0  MEWS LOC 0  MEWS Score 2   Patient in yellow mews due to pulse and BP. This charge nurse aware and provider notified.

## 2023-12-20 NOTE — Progress Notes (Signed)
   12/20/23 1712 12/20/23 1715 12/20/23 1727  Vitals  BP (!) 146/100 (!) 133/97 97/75  MAP (mmHg) 111 105 83  BP Location Right Arm Right Arm Right Arm  BP Method Automatic Automatic Automatic  Patient Position (if appropriate) Orthostatic Vitals (lying) Orthostatic Vitals (sitting) Orthostatic Vitals (standing)  Pulse Rate (!) 105 (!) 118 (!) 129   Orthostatic vital obtained. Pt complaining of slight light headedness with the standing vitals and once she was sitting again she had no more complaints. Will continue to monitor.

## 2023-12-20 NOTE — Progress Notes (Signed)
 Occupational Therapy Treatment Patient Details Name: Brandi Stephens MRN: [id number] DOB: September 28, 1945 Today's Date: 12/20/2023   History of present illness 78 yr old female admitted 12/12/23 due to generalized weakness, dizziness, and multiple falls. PMH includes chronic diastolic heart failure, HTN, HLD, GERD, DDD s/p spinal fusion, chronic back pain, gout, hypothyroidism, breast cancer s/p lumpectomy, gastroparesis, anxiety and depression, osteomyelitis of the lumbar spine on suppressive antibiotics   OT comments  The pt was seen for ADL instruction and progression of functional activity. She required assist for lower body dressing, toileting at bathroom level, and for grooming standing at the sink. She reported having chronic moderate back pain and mild dizziness; dizziness has been an ongoing issue. She required intermittent modification of tasks, due to her chronic pain. She reported having lots of falls over the past ~6 months. Continue OT plan of care. Patient will benefit from continued inpatient follow up therapy, <3 hours/day.       If plan is discharge home, recommend the following:  A little help with walking and/or transfers;A little help with bathing/dressing/bathroom;Help with stairs or ramp for entrance;Assist for transportation;Assistance with cooking/housework   Equipment Recommendations  None recommended by OT    Recommendations for Other Services      Precautions / Restrictions Precautions Precautions: Fall Restrictions Weight Bearing Restrictions Per Provider Order: No       Mobility Bed Mobility Overal bed mobility: Needs Assistance Bed Mobility: Supine to Sit, Sit to Supine     Supine to sit: Supervision Sit to supine: Supervision        Transfers Overall transfer level: Needs assistance Equipment used: Rolling walker (2 wheels) Transfers: Sit to/from Stand Sit to Stand: Contact guard assist                 Balance     Sitting  balance-Leahy Scale: Good       Standing balance-Leahy Scale: Fair          ADL either performed or assessed with clinical judgement   ADL Overall ADL's : Needs assistance/impaired     Grooming: Contact guard assist;Standing;Cueing for safety Grooming Details (indicate cue type and reason): She performed hand washing in standing at sink level.             Lower Body Dressing: Moderate assistance;Sitting/lateral leans Lower Body Dressing Details (indicate cue type and reason): for sock management seated EOB; pt limited by chronic back pain Toilet Transfer: Contact guard assist;Ambulation;Grab bars Toilet Transfer Details (indicate cue type and reason): She ambulated to and from the bathroom in her room. Toileting- Clothing Manipulation and Hygiene: Contact guard assist;Sit to/from stand;Cueing for safety               Communication Communication Communication: No apparent difficulties   Cognition Arousal: Alert Behavior During Therapy: WFL for tasks assessed/performed          Following commands: Intact        Cueing   Cueing Techniques: Verbal cues             Pertinent Vitals/ Pain       Pain Assessment Pain Assessment: 0-10 Pain Score: 5  Pain Location: chronic back pain Pain Intervention(s): Limited activity within patient's tolerance, Monitored during session, Repositioned   Frequency  Min 2X/week        Progress Toward Goals  OT Goals(current goals can now be found in the care plan section)     Acute Rehab OT Goals Patient Stated Goal: to get  to rehab OT Goal Formulation: With patient Time For Goal Achievement: 12/27/23 Potential to Achieve Goals: Good  Plan         AM-PAC OT 6 Clicks Daily Activity     Outcome Measure   Help from another person eating meals?: None Help from another person taking care of personal grooming?: A Little Help from another person toileting, which includes using toliet, bedpan, or urinal?: A  Little Help from another person bathing (including washing, rinsing, drying)?: A Lot Help from another person to put on and taking off regular upper body clothing?: A Little Help from another person to put on and taking off regular lower body clothing?: A Lot 6 Click Score: 17    End of Session Equipment Utilized During Treatment: Rolling walker (2 wheels)  OT Visit Diagnosis: Unsteadiness on feet (R26.81);History of falling (Z91.81);Muscle weakness (generalized) (M62.81);Pain Pain - part of body:  (back)   Activity Tolerance Patient limited by pain   Patient Left in bed;with call bell/phone within reach;with bed alarm set   Nurse Communication Mobility status        Time: 1600-1620 OT Time Calculation (min): 20 min  Charges: OT General Charges $OT Visit: 1 Visit OT Treatments $Self Care/Home Management : 8-22 mins     Delanna JINNY Lesches, OTR/L 12/20/2023, 5:33 PM

## 2023-12-20 NOTE — Telephone Encounter (Signed)
 Patient's daughter, [name] is advised that Dr Leigh has reviewed labs and continues to see downward trend in LFT's which is good. Discussed we are not sure if there was a prior drug reaction or liver shock. As long as LFTs continue downward trend, we can monitor rather than doing liver biopsy. Also advised we recommend repeat LFT's in 2 weeks post hospitalization. Patient has been rescheduled to see Dr Leigh for 02/07/24 at 230 pm and [facility] verbalizes understanding of this.

## 2023-12-21 DIAGNOSIS — I5032 Chronic diastolic (congestive) heart failure: Secondary | ICD-10-CM | POA: Diagnosis not present

## 2023-12-21 DIAGNOSIS — E119 Type 2 diabetes mellitus without complications: Secondary | ICD-10-CM | POA: Diagnosis not present

## 2023-12-21 DIAGNOSIS — R7989 Other specified abnormal findings of blood chemistry: Secondary | ICD-10-CM | POA: Diagnosis not present

## 2023-12-21 DIAGNOSIS — R531 Weakness: Secondary | ICD-10-CM | POA: Diagnosis not present

## 2023-12-21 DIAGNOSIS — M47816 Spondylosis without myelopathy or radiculopathy, lumbar region: Secondary | ICD-10-CM | POA: Diagnosis not present

## 2023-12-21 DIAGNOSIS — I10 Essential (primary) hypertension: Secondary | ICD-10-CM

## 2023-12-21 DIAGNOSIS — R55 Syncope and collapse: Secondary | ICD-10-CM | POA: Diagnosis not present

## 2023-12-21 LAB — COMPREHENSIVE METABOLIC PANEL WITH GFR
ALT: 129 U/L — ABNORMAL HIGH (ref 0–44)
AST: 83 U/L — ABNORMAL HIGH (ref 15–41)
Albumin: 3.1 g/dL — ABNORMAL LOW (ref 3.5–5.0)
Alkaline Phosphatase: 79 U/L (ref 38–126)
Anion gap: 8 (ref 5–15)
BUN: 24 mg/dL — ABNORMAL HIGH (ref 8–23)
CO2: 27 mmol/L (ref 22–32)
Calcium: 10 mg/dL (ref 8.9–10.3)
Chloride: 111 mmol/L (ref 98–111)
Creatinine, Ser: 0.99 mg/dL (ref 0.44–1.00)
GFR, Estimated: 58 mL/min — ABNORMAL LOW (ref >60)
Glucose, Bld: 85 mg/dL (ref 70–99)
Potassium: 3.9 mmol/L (ref 3.5–5.1)
Sodium: 146 mmol/L — ABNORMAL HIGH (ref 135–145)
Total Bilirubin: 1.7 mg/dL — ABNORMAL HIGH (ref 0.0–1.2)
Total Protein: 4.8 g/dL — ABNORMAL LOW (ref 6.5–8.1)

## 2023-12-21 LAB — CBC WITH DIFFERENTIAL/PLATELET
Abs Immature Granulocytes: 0.11 K/uL — ABNORMAL HIGH (ref 0.00–0.07)
Basophils Absolute: 0.1 K/uL (ref 0.0–0.1)
Basophils Relative: 2 %
Eosinophils Absolute: 0.6 K/uL — ABNORMAL HIGH (ref 0.0–0.5)
Eosinophils Relative: 8 %
HCT: 41.4 % (ref 36.0–46.0)
Hemoglobin: 13.1 g/dL (ref 12.0–15.0)
Immature Granulocytes: 2 %
Lymphocytes Relative: 26 %
Lymphs Abs: 1.8 K/uL (ref 0.7–4.0)
MCH: 31.7 pg (ref 26.0–34.0)
MCHC: 31.6 g/dL (ref 30.0–36.0)
MCV: 100.2 fL — ABNORMAL HIGH (ref 80.0–100.0)
Monocytes Absolute: 0.8 K/uL (ref 0.1–1.0)
Monocytes Relative: 11 %
Neutro Abs: 3.4 K/uL (ref 1.7–7.7)
Neutrophils Relative %: 51 %
Platelets: 158 K/uL (ref 150–400)
RBC: 4.13 MIL/uL (ref 3.87–5.11)
RDW: 15.8 % — ABNORMAL HIGH (ref 11.5–15.5)
WBC: 6.7 K/uL (ref 4.0–10.5)
nRBC: 0 % (ref 0.0–0.2)

## 2023-12-21 LAB — GLUCOSE, CAPILLARY
Glucose-Capillary: 68 mg/dL — ABNORMAL LOW (ref 70–99)
Glucose-Capillary: 99 mg/dL (ref 70–99)
Glucose-Capillary: 130 mg/dL — ABNORMAL HIGH (ref 70–99)

## 2023-12-21 LAB — PHOSPHORUS: Phosphorus: 2.6 mg/dL (ref 2.5–4.6)

## 2023-12-21 LAB — MAGNESIUM: Magnesium: 2.2 mg/dL (ref 1.7–2.4)

## 2023-12-21 MED ORDER — DEXTROSE 5 % IV SOLN: INTRAVENOUS | Status: DC

## 2023-12-21 MED ORDER — SENNOSIDES-DOCUSATE SODIUM 8.6-50 MG PO TABS: 1.0000 | ORAL_TABLET | Freq: Every evening | ORAL | Status: AC | PRN

## 2023-12-21 MED ORDER — SODIUM CHLORIDE 0.9 % IV BOLUS
1000.0000 mL | Freq: Once | INTRAVENOUS | Status: AC
  Administered 2023-12-21: 1000 mL via INTRAVENOUS

## 2023-12-21 MED ORDER — LEVOTHYROXINE SODIUM 50 MCG PO TABS
50.0000 ug | ORAL_TABLET | Freq: Every day | ORAL | 0 refills | Status: DC
Start: 2023-12-22 — End: 2024-01-17

## 2023-12-21 MED ORDER — METHOCARBAMOL 500 MG PO TABS: 500.0000 mg | ORAL_TABLET | Freq: Four times a day (QID) | ORAL | Status: DC | PRN

## 2023-12-21 MED ORDER — PREDNISONE 5 MG PO TABS: 5.0000 mg | ORAL_TABLET | Freq: Every day | ORAL | 0 refills | Status: DC

## 2023-12-21 MED ORDER — MAGNESIUM OXIDE -MG SUPPLEMENT 400 (240 MG) MG PO TABS: 400.0000 mg | ORAL_TABLET | Freq: Every day | ORAL | 0 refills | Status: DC

## 2023-12-21 MED ORDER — ONDANSETRON HCL 4 MG PO TABS
4.0000 mg | ORAL_TABLET | Freq: Four times a day (QID) | ORAL | 0 refills | Status: AC | PRN
Start: 2023-12-21 — End: ?

## 2023-12-21 MED ORDER — MIDODRINE HCL 2.5 MG PO TABS: 2.5000 mg | ORAL_TABLET | Freq: Two times a day (BID) | ORAL | Status: DC

## 2023-12-21 NOTE — TOC Transition Note (Signed)
 Transition of Care [facility]) - Discharge Note   Patient Details  Name: Brandi Stephens MRN: [id number] Date of Birth: May 08, 1945  [facility] CM/SW Contact:  Sonda Manuella Quill, RN Phone Number: 12/21/2023, 2:38 PM   Clinical Narrative:    D/C orders received; notified [place] at [facility] [address]; she gave RM # [id number], call report # [phone]; pt notified and said her dtr [name] Pinal will provide transportation; D/C summary and SNF transfer report sent via SNF hub; no TOC needs.   Final next level of care: Skilled Nursing Facility Barriers to Discharge: No Barriers Identified   Patient Goals and CMS Choice Patient states their goals for this hospitalization and ongoing recovery are:: To go to [email or web address].gov Compare Post Acute Care list provided to:: Patient Represenative (must comment) (Daughter) Choice offered to / list presented to : Adult Children [place] ownership interest in [facility].provided to:: Adult Children    Discharge Placement              Patient chooses bed at: [facility] Patient to be transferred to facility by: pt said her dtr Maranatha Grossi will provide transportation Name of family member notified: Ravyn Nikkel Patient and family notified of of transfer: 12/20/23  Discharge Plan and Services Additional resources added to the After Visit Summary for   In-house Referral: Clinical Social Work   Post Acute Care Choice: Skilled Nursing Facility          DME Arranged: N/A DME Agency: NA       HH Arranged: NA HH Agency: NA        Social Drivers of Health (SDOH) Interventions SDOH Screenings   Food Insecurity: No Food Insecurity (12/13/2023)  Housing: Low Risk  (12/13/2023)  Transportation Needs: No Transportation Needs (12/13/2023)  Utilities: Not At Risk (12/13/2023)  Alcohol  Screen: Low Risk  (05/02/2023)  Depression (PHQ2-9): Low Risk  (06/26/2023)  Financial Resource Strain: Low Risk  (05/02/2023)   Physical Activity: Inactive (05/02/2023)  Social Connections: Socially Integrated (12/13/2023)  Stress: Stress Concern Present (05/02/2023)  Tobacco Use: Medium Risk (12/12/2023)  Health Literacy: Adequate Health Literacy (05/02/2023)     Readmission Risk Interventions    12/16/2023    3:59 PM 06/23/2022   10:55 AM  Readmission Risk Prevention Plan  Post Dischage Appt Complete   Medication Screening Complete   Transportation Screening Complete Complete  PCP or Specialist Appt within 3-5 Days  Complete  HRI or Home Care Consult  Complete  Social Work Consult for Recovery Care Planning/Counseling  Complete  Palliative Care Screening  Not Applicable

## 2023-12-21 NOTE — Plan of Care (Signed)
  Problem: Clinical Measurements: Goal: Diagnostic test results will improve Outcome: Progressing   Problem: Coping: Goal: Level of anxiety will decrease Outcome: Progressing   Problem: Health Behavior/Discharge Planning: Goal: Ability to manage health-related needs will improve Outcome: Progressing   Problem: Metabolic: Goal: Ability to maintain appropriate glucose levels will improve Outcome: Progressing

## 2023-12-21 NOTE — Discharge Summary (Signed)
 Physician Discharge Summary   Patient: Brandi Stephens MRN: [id number] DOB: May 29, 1945  Admit date:     12/12/2023  Discharge date: 12/21/23  Discharge Physician: Alejandro Marker, DO   PCP: Bulah Alm RAMAN, PA-C   Recommendations at discharge:   Follow up with PCP w/in 1-2 weeks and repeat CBC, CMP, Mag, Phos w/in 1 week Follow up with Endocrinology w/in 1-2 weeks Follow up with Gastroenterology w/in 1-2 weeks  Discharge Diagnoses: Principal Problem:   Generalized weakness Active Problems:   Syncope   Elevated LFTs   Syncope and collapse  Resolved Problems:   * No resolved hospital problems. Brandi Stephens Course: The patient is a 78 year old Caucasian female with past medical history significant for but not limited to chronic diastolic CHF, essential hypertension, hyperlipidemia, GERD, history of spinal fusion and chronic back pain and chronic osteomyelitis on suppressive antibiotic therapy, anxiety and depression who was recently diagnosed with abnormal LFTs and transaminitis which is being worked up in the outpatient setting presented to the ED with multiple falls, dizziness and lightheadedness as well as chronic back pain.  She described episodes of blacking out and falling.  In the ED she was noted to be hemodynamically stable and LFTs started improving.  She was found to have significant orthostatic hypotension with acute on chronic dizziness with postural changes.  She started clinically improving and orthostatic VS stable and improved.  Medically stable to be discharged at this time will need to follow-up with PCP, gastroenterology and endocrinology in outpatient setting within 1 to 2 weeks..   Assessment and Plan:  Multiple falls, severe orthostatic symptoms with history of orthostatic hypotension: Muscle relaxants discontinued.  Lyrica  continued. TED hose all the time as possible but change from knee-high to thigh High. Add Abdominal Binder.  Continue with Midodrine  2.5 mg  twice daily.  All-time orthostatic precautions.  Continue mobility with PT OT. A.m. cortisol less than 2.  ACTH  stimulation test with normal findings.  Potassium is normal. Due to persistent orthostatics, trial of Prednisone  5 mg daily and will continue for now.  Ambulatory referral to Endocrine sent. Repeat Orthostatics today improved after bolus w/ no symptoms.  She is medically stable to be discharged to SNF at this time.   Abnormal LFTs/Transaminitis / Hyperbilirubinemia: Recently been worked up as outpatient.  Continue follow-up.  LFTs are improving. AST and ALT Trend:  Recent Labs  Lab 11/28/23 1636 12/12/23 1602 12/13/23 0617 12/15/23 0641 12/20/23 0957 12/21/23 0545  AST 170* 426* 385* 263* 111* 83*  ALT 204* 414* 374* 293* 170* 129*  BILITOT 1.2 3.3* 2.8* 3.8* 2.5* 1.7*  ALKPHOS 86 108 101 94 93 79  -CTM and Trend and repeat CMP w/in 1 week and follow up with Gastroenterology   Chronic back pain, status post spinal fusion, chronic osteomyelitis of the L-spine: Patient on chronic suppressive therapy with Cefadroxil  500 mg po BID which is being continued.  Allergic to Acetaminophen  so will use Ibuprofen  400 mg po q8h. Already on Lyrica .  Pain controlled on current regimen.   Hypernatremia: Mild at 146. Placed on D5W for now and can be monitored and repeated in the outpt setting w/in 1 week.   Hypoglycemia: Mild. HbA1c was 5.5. Encourage her to eat and not miss meals. CBG Trend:  Recent Labs  Lab 12/20/23 0729 12/20/23 1217 12/20/23 1705 12/20/23 2230 12/21/23 0750 12/21/23 0812 12/21/23 1151  GLUCAP 82 116* 158* 155* 68* 99 130*  -She is on a Moderate Novolog  SSI AC and will  discontinue.   Diarrhea: Mild. Hold Senna-Docusate 1 tab po at bedtime and Bisacodyl  5 mg po Dailyprn. Unlikely Infectious as she is afebrile and has no Leukocytosis. CTM and trail Antidiarrheals. Follow in the outpt setting.   Hypothyroidism: On Synthroid .  TSH is less than 0.1.  Decrease thyroxine  from 75 mcg->> 50 mcg.  Will need to recheck in 1 month and has outpatient Endocrinologist referral   Renal Insufficiency: BUN/Cr Trend: Recent Labs  Lab 12/12/23 1602 12/13/23 0147 12/15/23 0641 12/20/23 0957 12/21/23 0545  BUN 22 21 12  26* 24*  CREATININE 1.15* 0.96 0.84 1.03* 0.99  -Avoid Nephrotoxic Medications, Contrast Dyes, Hypotension and Dehydration to Ensure Adequate Renal Perfusion and will need to Renally Adjust Meds -Continue to Monitor and Trend Renal Function carefully and repeat CMP in the AM    GERD/GI Prophylaxis: On PPI w/ Pantoprazole  40 mg po BID and will continue    Gout: on Allopurinol  300 mg po qHS   Hypomagnesemia: Replaced and Mag is now 2.2.  Also keep on his scheduled oral magnesium .  Hypokalemia: K+ was 3.4 and improved to 3.9. Replete w/ po KCL 40 mEQ BID x2 yesterday. CTM and Replete as Necessary. Repeat CMP in the AM   Confusion, hallucinations: Resolved. Hospital-acquired delirium.  Fall precautions.  Delirium precautions.  Avoid benzodiazepines.  Symptomatic management.  Improving and back to baseline as she is A and O x3   Consultants: None Procedures performed: As delineated as above   Disposition: Skilled nursing facility Diet recommendation:  Discharge Diet Orders (From admission, onward)     Start     Ordered   12/21/23 0000  Diet - low sodium heart healthy        12/21/23 1323   12/21/23 0000  Diet Carb Modified        12/21/23 1323           Regular diet DISCHARGE MEDICATION: Allergies as of 12/21/2023       Reactions   Ampicillin  Hives, Other (See Comments)   Severe reaction in February 2017 1.5 month to have hives to go away   Gabapentin  Swelling   UNSPECIFIED REACTION    Penicillins Hives, Other (See Comments), Rash   Severe reaction in February 2017 1.5 month to have hives to go away Has patient had a PCN reaction causing immediate rash, facial/tongue/throat swelling, SOB or lightheadedness with hypotension: #  #  #  YES   #  #  #  Has patient had a PCN reaction causing severe rash involving mucus membranes or skin necrosis: No Has patient had a PCN reaction that required hospitalization No Has patient had a PCN reaction occurring within the last 10 years: No   Percocet [oxycodone -acetaminophen ] Hives, Itching   Crestor [rosuvastatin]    Mental fog, myalgia   Hydrocodone  Itching   Can tolerate with benadryl    Ozempic  (0.25 Or 0.5 Mg-dose) [semaglutide (0.25 Or 0.5mg -dos)] Nausea Only   Other Itching   UNSPECIFIED Analgesics        Medication List     STOP taking these medications    oxyCODONE  5 MG immediate release tablet Commonly known as: Oxy IR/ROXICODONE    tiZANidine  4 MG tablet Commonly known as: Zanaflex        TAKE these medications    allopurinol  300 MG tablet Commonly known as: ZYLOPRIM  Take 1 tablet (300 mg total) by mouth daily.   amitriptyline  25 MG tablet Commonly known as: ELAVIL  Take 25 mg by mouth at bedtime.   Biotin  3  MG Tabs Take 3 mg by mouth daily.   cefadroxil  500 MG capsule Commonly known as: DURICEF Take 1 capsule (500 mg total) by mouth 2 (two) times daily.   colchicine  0.6 MG tablet TAKE 2 TABLETS BY MOUTH ONCE, THEN 1 TABLET AN HOUR LATER, THEN 1 TABLET TWICE DAILY FOR 3 DAYS What changed:  how much to take how to take this when to take this reasons to take this additional instructions   cyanocobalamin  1000 MCG tablet Take 1,000 mcg by mouth daily.   HM Lidocaine  Patch 4 % Generic drug: lidocaine  Place 1 patch onto the skin daily.   ibuprofen  200 MG tablet Commonly known as: ADVIL  Take 200 mg by mouth every 6 (six) hours as needed for mild pain (pain score 1-3).   levothyroxine  50 MCG tablet Commonly known as: SYNTHROID  Take 1 tablet (50 mcg total) by mouth daily at 6 (six) AM. Start taking on: December 22, 2023 What changed:  medication strength how much to take when to take this   magnesium  oxide 400 (240 Mg) MG tablet Commonly  known as: MAG-OX Take 1 tablet (400 mg total) by mouth daily. Start taking on: December 22, 2023   methocarbamol  500 MG tablet Commonly known as: ROBAXIN  Take 1 tablet (500 mg total) by mouth every 6 (six) hours as needed for muscle spasms.   midodrine  2.5 MG tablet Commonly known as: PROAMATINE  Take 1 tablet (2.5 mg total) by mouth 2 (two) times daily with a meal. What changed:  medication strength how much to take   ondansetron  4 MG tablet Commonly known as: ZOFRAN  Take 1 tablet (4 mg total) by mouth every 6 (six) hours as needed for nausea.   pantoprazole  40 MG tablet Commonly known as: PROTONIX  Take 1 tablet (40 mg total) by mouth 2 (two) times daily.   polyethylene glycol powder 17 GM/SCOOP powder Commonly known as: GLYCOLAX /MIRALAX  Take 17 g by mouth daily as needed for moderate constipation.   predniSONE  5 MG tablet Commonly known as: DELTASONE  Take 1 tablet (5 mg total) by mouth daily with breakfast. Start taking on: December 22, 2023   pregabalin  50 MG capsule Commonly known as: Lyrica  Take 1 capsule (50 mg total) by mouth 3 (three) times daily.   senna-docusate 8.6-50 MG tablet Commonly known as: Senokot-S Take 1 tablet by mouth at bedtime as needed for mild constipation.   SYSTANE BALANCE OP Place 1 drop into both eyes 4 (four) times daily as needed (dry eyes).   traZODone  50 MG tablet Commonly known as: DESYREL  Take 1 tablet (50 mg total) by mouth at bedtime. What changed:  when to take this reasons to take this   Vitamin D3 50 MCG (2000 UT) capsule Take 1 capsule (2,000 Units total) by mouth daily.        Contact information for after-discharge care     Destination     [facility] .   Service: Skilled Nursing Contact information: [address] [place]  [address] [phone]                    Discharge Exam: Brandi Stephens   12/13/23 0000  Weight: 73.7 kg   Vitals:   12/21/23 1319 12/21/23 1332  BP:  124/89   Pulse: (!) 131 (!) 103  Resp:    Temp:    SpO2:     Examination: Physical Exam:  Constitutional: WN/WD, overweight Caucasian female in no acute distress Respiratory: Diminished to auscultation bilaterally, no wheezing, rales,  rhonchi or crackles. Normal respiratory effort and patient is not tachypenic. No accessory muscle use.  Unlabored breathing Cardiovascular: RRR, no murmurs / rubs / gallops. S1 and S2 auscultated. No extremity edema.  Abdomen: Soft, non-tender, mildly distended.  Bowel sounds positive.  GU: Deferred. Musculoskeletal: No clubbing / cyanosis of digits/nails. No joint deformity upper and lower extremities.  Skin: No rashes, lesions, ulcers limited skin evaluation. No induration; Warm and dry.  Neurologic: CN 2-12 grossly intact with no focal deficits. Romberg sign and cerebellar reflexes not assessed.  Psychiatric: Normal judgment and insight. Alert and oriented x 3. Normal mood and appropriate affect.   Condition at discharge: stable  The results of significant diagnostics from this hospitalization (including imaging, microbiology, ancillary and laboratory) are listed below for reference.   Imaging Studies: US  Abdomen Limited RUQ (LIVER/GB) Result Date: 12/12/2023 CLINICAL DATA:  [id number] RUQ abdominal pain 151470 EXAM: ULTRASOUND ABDOMEN LIMITED RIGHT UPPER QUADRANT COMPARISON:  None Available. FINDINGS: Gallbladder: Contracted gallbladder. No gallstones or wall thickening visualized. No sonographic Murphy sign noted by sonographer. Common bile duct: Diameter: 3 mm Liver: Limited evaluation of the left hepatic lobe. No focal lesion identified. Within normal limits in parenchymal echogenicity. Portal vein is patent on color Doppler imaging with normal direction of blood flow towards the liver. Other: None. IMPRESSION: 1. Unremarkable right upper quadrant ultrasound. 2. Contracted gallbladder. Electronically Signed   By: Morgane  Naveau M.D.   On: 12/12/2023 19:44    CT Head Wo Contrast Result Date: 12/12/2023 CLINICAL DATA:  Dizziness and weakness with subsequent fall. EXAM: CT HEAD WITHOUT CONTRAST TECHNIQUE: Contiguous axial images were obtained from the base of the skull through the vertex without intravenous contrast. RADIATION DOSE REDUCTION: This exam was performed according to the departmental dose-optimization program which includes automated exposure control, adjustment of the mA and/or kV according to patient size and/or use of iterative reconstruction technique. COMPARISON:  July 23, 2022 FINDINGS: Brain: There is generalized cerebral atrophy with widening of the extra-axial spaces and ventricular dilatation. There are areas of decreased attenuation within the white matter tracts of the supratentorial brain, consistent with microvascular disease changes. Vascular: Mild to moderate severity bilateral cavernous carotid artery calcification is noted. Skull: Normal. Negative for fracture or focal lesion. Sinuses/Orbits: No acute finding. Other: None. IMPRESSION: 1. Generalized cerebral atrophy and microvascular disease changes of the supratentorial brain. 2. No acute intracranial abnormality. Electronically Signed   By: Suzen Dials M.D.   On: 12/12/2023 19:30   DG Chest 2 View Result Date: 12/12/2023 CLINICAL DATA:  Status post fall. EXAM: CHEST - 2 VIEW COMPARISON:  None [facility]a chest radiograph July 18, 2022.  Ble. FINDINGS: The heart size and mediastinal contours are within normal limits. No suspicious pulmonary nodule or consolidation. The visualized skeletal structures are unremarkable. No acute fracture identified. Mild compression deformity of the thoracic spine, similar to prior. Surgical clips overlying the left chest wall from prior lumpectomy. IMPRESSION: No active cardiopulmonary disease.  No acute osseous lesion. Electronically Signed   By: Megan  Zare M.D.   On: 12/12/2023 17:54   Microbiology: Results for orders placed or performed in visit  on 10/23/23  Microscopic Examination     Status: Abnormal   Collection Time: 10/23/23  3:11 PM  Result Value Ref Range Status   WBC, UA 6-10 (A) 0 - 5 /hpf Final   RBC, Urine None seen 0 - 2 /hpf Final   Epithelial Cells (non renal) >10 (A) 0 - 10 /hpf Final   Casts  None seen None seen /lpf Final   Bacteria, UA Moderate (A) None seen/Few Final  Urine Culture     Status: Abnormal   Collection Time: 10/23/23  4:12 PM   Specimen: Urine   UR  Result Value Ref Range Status   Urine Culture, Routine Final report (A)  Final   Organism ID, Bacteria Klebsiella pneumoniae (A)  Final    Comment: Cefazolin  with an MIC <=16 predicts susceptibility to the oral agents cefaclor, cefdinir , cefpodoxime, cefprozil, cefuroxime, cephalexin , and loracarbef when used for therapy of uncomplicated urinary tract infections due to E. coli, Klebsiella pneumoniae, and Proteus mirabilis. 25,000-50,000 colony forming units per mL    ORGANISM ID, BACTERIA Enterococcus faecalis (A)  Final    Comment: Enterococci susceptible to penicillin are predictably susceptible to ampicillin , amoxicillin, ampicillin -sulbactam, amoxicillin-clavulanate, and piperacillin -tazobactam for non-beta-lactamase producing enterococci. (CLSI 2018) For Enterococcus species, aminoglycosides (except for high-level resistance screening), cephalosporins, clindamycin, and trimethoprim -sulfamethoxazole  are not effective clinically. (CLSI, M100-S26, 2016) 25,000-50,000 colony forming units per mL    Antimicrobial Susceptibility Comment  Final    Comment:       ** S = Susceptible; I = Intermediate; R = Resistant **                    P = Positive; N = Negative             MICS are expressed in micrograms per mL    Antibiotic                 RSLT#1    RSLT#2    RSLT#3    RSLT#4 Amoxicillin/Clavulanic Acid    S Ampicillin                      R Cefazolin                       S Cefepime                        S Cefoxitin                       S Cefpodoxime                    S Ceftriaxone                    S Ciprofloxacin                   S         S Ertapenem                      S Gentamicin                     S Levofloxacin                    S         S Meropenem                      S Nitrofurantoin                 S         S Penicillin  S Piperacillin /Tazobactam        S Tetracycline                   S         R Tobramycin                     S Trimethoprim /Sulfa              S Vancomycin                                S    *Note: Due to a large number of results and/or encounters for the requested time period, some results have not been displayed. A complete set of results can be found in Results Review.   Labs: CBC: Recent Labs  Lab 12/15/23 0641 12/20/23 0957 12/21/23 0545  WBC 5.6 7.2 6.7  NEUTROABS 3.0 4.2 3.4  HGB 13.2 15.2* 13.1  HCT 40.8 46.6* 41.4  MCV 97.6 100.0 100.2*  PLT 118* 190 158   Basic Metabolic Panel: Recent Labs  Lab 12/15/23 0641 12/20/23 0957 12/21/23 0545  NA 146* 145 146*  K 3.5 3.4* 3.9  CL 112* 108 111  CO2 23 27 27   GLUCOSE 101* 110* 85  BUN 12 26* 24*  CREATININE 0.84 1.03* 0.99  CALCIUM  9.0 10.3 10.0  MG 1.5* 1.7 2.2  PHOS  --  2.8 2.6   Liver Function Tests: Recent Labs  Lab 12/15/23 0641 12/20/23 0957 12/21/23 0545  AST 263* 111* 83*  ALT 293* 170* 129*  ALKPHOS 94 93 79  BILITOT 3.8* 2.5* 1.7*  PROT 4.4* 5.6* 4.8*  ALBUMIN  2.9* 3.6 3.1*   CBG: Recent Labs  Lab 12/20/23 1705 12/20/23 2230 12/21/23 0750 12/21/23 0812 12/21/23 1151  GLUCAP 158* 155* 68* 99 130*   Discharge time spent: greater than 30 minutes.  Signed: Alejandro Marker, DO [facility] 12/21/2023

## 2023-12-22 ENCOUNTER — Ambulatory Visit: Admitting: Physician Assistant

## 2023-12-24 ENCOUNTER — Other Ambulatory Visit: Payer: Self-pay | Admitting: Internal Medicine

## 2023-12-24 MED ORDER — PREGABALIN 50 MG PO CAPS: 50.0000 mg | ORAL_CAPSULE | Freq: Three times a day (TID) | ORAL | 0 refills | Status: DC

## 2023-12-28 DIAGNOSIS — E569 Vitamin deficiency, unspecified: Secondary | ICD-10-CM | POA: Diagnosis not present

## 2023-12-28 DIAGNOSIS — E039 Hypothyroidism, unspecified: Secondary | ICD-10-CM | POA: Diagnosis not present

## 2023-12-28 DIAGNOSIS — D539 Nutritional anemia, unspecified: Secondary | ICD-10-CM | POA: Diagnosis not present

## 2023-12-28 DIAGNOSIS — E118 Type 2 diabetes mellitus with unspecified complications: Secondary | ICD-10-CM | POA: Diagnosis not present

## 2023-12-28 LAB — LAB REPORT - SCANNED: EGFR: 83

## 2024-01-02 LAB — LAB REPORT - SCANNED: EGFR: 83

## 2024-01-03 DIAGNOSIS — R52 Pain, unspecified: Secondary | ICD-10-CM | POA: Diagnosis not present

## 2024-01-03 DIAGNOSIS — R600 Localized edema: Secondary | ICD-10-CM | POA: Diagnosis not present

## 2024-01-04 NOTE — Telephone Encounter (Signed)
 Per [name], patient is still in [facility] (phone [phone]) for at least another 1-2 weeks.  I have contacted Ellouise at [facility] and requested that they draw liver function test on patient. She states that they will draw this tomorrow.

## 2024-01-08 DIAGNOSIS — I10 Essential (primary) hypertension: Secondary | ICD-10-CM | POA: Diagnosis not present

## 2024-01-08 DIAGNOSIS — R609 Edema, unspecified: Secondary | ICD-10-CM | POA: Diagnosis not present

## 2024-01-08 DIAGNOSIS — R52 Pain, unspecified: Secondary | ICD-10-CM | POA: Diagnosis not present

## 2024-01-10 NOTE — Telephone Encounter (Signed)
 Left a voicemail on the nursing supervisors machine asking that liver function tests that were to be completed on 01/06/24 be faxed to our office at [phone]. I was unable to speak with any of the nursing staff. Got voicemail x 2.

## 2024-01-12 ENCOUNTER — Telehealth: Payer: Self-pay

## 2024-01-12 DIAGNOSIS — Z9181 History of falling: Secondary | ICD-10-CM | POA: Diagnosis not present

## 2024-01-12 DIAGNOSIS — M47816 Spondylosis without myelopathy or radiculopathy, lumbar region: Secondary | ICD-10-CM | POA: Diagnosis not present

## 2024-01-12 DIAGNOSIS — R55 Syncope and collapse: Secondary | ICD-10-CM | POA: Diagnosis not present

## 2024-01-12 DIAGNOSIS — E119 Type 2 diabetes mellitus without complications: Secondary | ICD-10-CM | POA: Diagnosis not present

## 2024-01-12 NOTE — Transitions of Care (Post Inpatient/ED Visit) (Signed)
 01/12/2024  Name: Brandi Stephens MRN: [id number] DOB: Sep 09, 1945  Today's TOC FU Call Status: Today's TOC FU Call Status:: Successful TOC FU Call Completed TOC FU Call Complete Date: 01/12/24 Patient's Name and Date of Birth confirmed.  Transition Care Management Follow-up Telephone Call Date of Discharge: 01/11/24 Discharge Facility: Other [facility]) Name of Other (Non[facility]) Discharge Facility: [facility] Type of Discharge: Inpatient Admission Primary Inpatient Discharge Diagnosis:: syncope How have you been since you were released from the hospital?: Better Any questions or concerns?: No  Items Reviewed: Did you receive and understand the discharge instructions provided?: Yes Medications obtained,verified, and reconciled?: Yes (Medications Reviewed) Any new allergies since your discharge?: No Dietary orders reviewed?: Yes Do you have support at home?: Yes People in Home [RPT]: spouse  Medications Reviewed Today: Medications Reviewed Today     Reviewed by Emmitt Pan, LPN (Licensed Practical Nurse) on 01/12/24 at 1020  Med List Status: <None>   Medication Order Taking? Sig Documenting Provider Last Dose Status Informant  allopurinol  (ZYLOPRIM ) 300 MG tablet [id number] Yes Take 1 tablet (300 mg total) by mouth daily. Tysinger, Alm RAMAN, PA-C  Active Self, Pharmacy Records  amitriptyline  (ELAVIL ) 25 MG tablet [id number] Yes Take 25 mg by mouth at bedtime. [provider]  Active Self, Pharmacy Records  Biotin  3 MG TABS [id number] Yes Take 3 mg by mouth daily. [provider]  Active Self, Pharmacy Records  cefadroxil  (DURICEF) 500 MG capsule [id number] Yes Take 1 capsule (500 mg total) by mouth 2 (two) times daily. Luiz Channel, MD  Active Self, Pharmacy Records           Med Note [place], UTAH A   Tue Jun 06, 2023 12:26 PM) Will be on forever due to spine infection  Cholecalciferol  (VITAMIN D3) 50 MCG (2000 UT) capsule [id number] Yes Take 1  capsule (2,000 Units total) by mouth daily. Tysinger, Alm RAMAN, PA-C  Active Self, Pharmacy Records  colchicine  0.6 MG tablet [id number] Yes TAKE 2 TABLETS BY MOUTH ONCE, THEN 1 TABLET AN HOUR LATER, THEN 1 TABLET TWICE DAILY FOR 3 DAYS  Patient taking differently: Take 0.6 mg by mouth as needed (Gout).   Tysinger, Alm RAMAN, PA-C  Active Self, Pharmacy Records  cyanocobalamin  1000 MCG tablet [id number] Yes Take 1,000 mcg by mouth daily. [provider]  Active Self, Pharmacy Records  ibuprofen  (ADVIL ) 200 MG tablet [id number] Yes Take 200 mg by mouth every 6 (six) hours as needed for mild pain (pain score 1-3). [provider]  Active Self, Pharmacy Records  levothyroxine  (SYNTHROID ) 50 MCG tablet [id number] Yes Take 1 tablet (50 mcg total) by mouth daily at 6 (six) AM. Sherrill Alejandro Donovan, DO  Active   lidocaine  (HM LIDOCAINE  PATCH) 4 % [id number]  Place 1 patch onto the skin daily.  Patient not taking: Reported on 01/12/2024   [provider]  Active Self, Pharmacy Records  magnesium  oxide (MAG-OX) 400 (240 Mg) MG tablet [id number] Yes Take 1 tablet (400 mg total) by mouth daily. Sheikh, Omair Dyer, DO  Active   methocarbamol  (ROBAXIN ) 500 MG tablet [id number] Yes Take 1 tablet (500 mg total) by mouth every 6 (six) hours as needed for muscle spasms. Sheikh, Omair [place], [place]  Active   midodrine  (PROAMATINE ) 2.5 MG tablet [id number] Yes Take 1 tablet (2.5 mg total) by mouth 2 (two) times daily with a meal. Sherrill Alejandro [place], DO  Active   ondansetron  (ZOFRAN ) 4 MG tablet [id number] Yes Take 1 tablet (4 mg total)  by mouth every 6 (six) hours as needed for nausea. Sheikh, Omair [place], [place]  Active   pantoprazole  (PROTONIX ) 40 MG tablet [id number] Yes Take 1 tablet (40 mg total) by mouth 2 (two) times daily. Odell Celinda Balo, MD  Active Self, Pharmacy Records  polyethylene glycol powder (GLYCOLAX /MIRALAX ) 17 GM/SCOOP powder [id number] Yes Take 17 g by mouth daily as needed for moderate  constipation. [provider]  Active Self, Pharmacy Records           Med Note RENNIS, DUROJAHYE' R   Tue Dec 12, 2023 10:45 PM)    predniSONE  (DELTASONE ) 5 MG tablet [id number] Yes Take 1 tablet (5 mg total) by mouth daily with breakfast. Sheikh, Omair Latif, DO  Active   pregabalin  (LYRICA ) 50 MG capsule [id number] Yes Take 1 capsule (50 mg total) by mouth 3 (three) times daily. Amin, Saad, MD  Active   Propylene Glycol (SYSTANE BALANCE OP) [id number] Yes Place 1 drop into both eyes 4 (four) times daily as needed (dry eyes). [provider]  Active Self, Pharmacy Records  senna-docusate (SENOKOT-S) 8.6-50 MG tablet [id number] Yes Take 1 tablet by mouth at bedtime as needed for mild constipation. Sheikh, Omair [place], DO  Active   traZODone  (DESYREL ) 50 MG tablet [id number] Yes Take 1 tablet (50 mg total) by mouth at bedtime.  Patient taking differently: Take 50 mg by mouth at bedtime as needed for sleep.   Lorilee Sven SQUIBB, MD  Active Self, Pharmacy Records           Med Note RENNIS, DUROJAHYE' R   Tue Dec 12, 2023 10:40 PM)              Home Care and Equipment/Supplies: Were Home Health Services Ordered?: Yes Name of Home Health Agency:: [facility] Has Agency set up a time to come to your home?: No Any new equipment or medical supplies ordered?: NA  Functional Questionnaire: Do you need assistance with bathing/showering or dressing?: Yes Do you need assistance with meal preparation?: Yes Do you need assistance with eating?: No Do you have difficulty maintaining continence: No Do you need assistance with getting out of bed/getting out of a chair/moving?: No Do you have difficulty managing or taking your medications?: No  Follow up appointments reviewed: PCP Follow-up appointment confirmed?: Yes Date of PCP follow-up appointment?: 01/17/24 Follow-up Provider: [facility] Follow-up appointment confirmed?: No Do you need transportation to your  follow-up appointment?: No Do you understand care options if your condition(s) worsen?: Yes-patient verbalized understanding    SIGNATURE Julian Lemmings, LPN [facility] Nurse Health Advisor Direct Dial [phone]

## 2024-01-15 NOTE — Telephone Encounter (Signed)
 Brandi Stephens - our mutual patient. Have not been able to get LFTs while she has been at Rehab, our staff has contacted them multiple times. Not sure if you have follow up with her soon or not, and if so, can she have labs done there

## 2024-01-15 NOTE — Telephone Encounter (Signed)
 Again attempted to reach [facility] staff to request LFT's that were supposed to have been completed as ordered by Dr Leigh on 01/06/24. I was sent to voicemail for the wellness center x 2. Number for nurse supervisor was on voicemail (phone [phone]). Again, no answer. I did leave another voicemail asking that results be faxed to our office.

## 2024-01-16 ENCOUNTER — Telehealth: Payer: Self-pay | Admitting: Internal Medicine

## 2024-01-16 NOTE — Telephone Encounter (Signed)
 Copied from CRM #[id number]. Topic: Clinical - Home Health Verbal Orders >> Jan 16, 2024 12:48 PM Leonette SQUIBB wrote: Caller/Agency: [place] with [facility]  Callback Number: [phone]  Service Requested: Skilled Nursing 1 week 4  PT 1 week 1, OT 1 week1 Social worker 1 week 1  Any new concerns about the patient? Yes  pain level

## 2024-01-16 NOTE — Telephone Encounter (Signed)
 Spoke to [place] with [facility] and she will have the patient drawn for the LFT this week requested by Dr. Leigh.

## 2024-01-16 NOTE — Telephone Encounter (Signed)
Brandi Stephens was notified

## 2024-01-16 NOTE — Telephone Encounter (Signed)
 [place] with HH also states that when patient got home yesterday, she threw her back out and has been laying around in bed. She was advised by Nurse with [facility] to take 800mg  of ibuprofen  with food to see if this would help   Ok'ed order for skilled nursing 2x week 1 (for lab draw) and then 1x week for 3 weeks

## 2024-01-17 ENCOUNTER — Other Ambulatory Visit: Payer: Self-pay | Admitting: Medical

## 2024-01-17 ENCOUNTER — Ambulatory Visit (INDEPENDENT_AMBULATORY_CARE_PROVIDER_SITE_OTHER): Admitting: Medical

## 2024-01-17 VITALS — BP 96/60 | HR 119 | Wt 161.6 lb

## 2024-01-17 DIAGNOSIS — R7401 Elevation of levels of liver transaminase levels: Secondary | ICD-10-CM

## 2024-01-17 DIAGNOSIS — E559 Vitamin D deficiency, unspecified: Secondary | ICD-10-CM | POA: Diagnosis not present

## 2024-01-17 DIAGNOSIS — M1000 Idiopathic gout, unspecified site: Secondary | ICD-10-CM

## 2024-01-17 DIAGNOSIS — K219 Gastro-esophageal reflux disease without esophagitis: Secondary | ICD-10-CM | POA: Diagnosis not present

## 2024-01-17 DIAGNOSIS — E790 Hyperuricemia without signs of inflammatory arthritis and tophaceous disease: Secondary | ICD-10-CM | POA: Diagnosis not present

## 2024-01-17 DIAGNOSIS — G894 Chronic pain syndrome: Secondary | ICD-10-CM | POA: Diagnosis not present

## 2024-01-17 DIAGNOSIS — E038 Other specified hypothyroidism: Secondary | ICD-10-CM | POA: Diagnosis not present

## 2024-01-17 DIAGNOSIS — R531 Weakness: Secondary | ICD-10-CM

## 2024-01-17 DIAGNOSIS — I10 Essential (primary) hypertension: Secondary | ICD-10-CM

## 2024-01-17 DIAGNOSIS — I951 Orthostatic hypotension: Secondary | ICD-10-CM

## 2024-01-17 DIAGNOSIS — M5416 Radiculopathy, lumbar region: Secondary | ICD-10-CM | POA: Diagnosis not present

## 2024-01-17 DIAGNOSIS — R55 Syncope and collapse: Secondary | ICD-10-CM

## 2024-01-17 MED ORDER — PANTOPRAZOLE SODIUM 40 MG PO TBEC: 40.0000 mg | DELAYED_RELEASE_TABLET | Freq: Two times a day (BID) | ORAL | 1 refills | Status: DC

## 2024-01-17 MED ORDER — MIDODRINE HCL 2.5 MG PO TABS: 2.5000 mg | ORAL_TABLET | Freq: Two times a day (BID) | ORAL | 3 refills | Status: DC

## 2024-01-17 MED ORDER — METHOCARBAMOL 500 MG PO TABS: 500.0000 mg | ORAL_TABLET | Freq: Three times a day (TID) | ORAL | 1 refills | Status: DC | PRN

## 2024-01-17 MED ORDER — MAGNESIUM OXIDE -MG SUPPLEMENT 400 (240 MG) MG PO TABS: 400.0000 mg | ORAL_TABLET | Freq: Every day | ORAL | 0 refills | Status: DC

## 2024-01-17 MED ORDER — LEVOTHYROXINE SODIUM 75 MCG PO TABS: 75.0000 ug | ORAL_TABLET | Freq: Every day | ORAL | 0 refills | Status: DC

## 2024-01-17 MED ORDER — AMITRIPTYLINE HCL 25 MG PO TABS: 25.0000 mg | ORAL_TABLET | Freq: Every day | ORAL | 0 refills | Status: DC

## 2024-01-17 MED ORDER — PREGABALIN 50 MG PO CAPS: 50.0000 mg | ORAL_CAPSULE | Freq: Three times a day (TID) | ORAL | 1 refills | Status: DC

## 2024-01-17 NOTE — Progress Notes (Signed)
 Subjective: Chief Complaint  Patient presents with   Hospitalization Follow-up    Hospitalization follow-up. She was placed on med to make BP high in hospital but then when she went to rehab they put her on meds to make her bp lower but now they don't want her to have low bp to syncope level with bp  Discuss lasix , midodrine , levothyroxine , magnesium , lyrica ,    Here for hospital f/u. Accompanied by daughter  She was hospitalized recently for syncope and falls, hallucinations and was found to have low blood pressures.  She was admitted to a rehab facility after the hospitalization to recover.  However there were significant medication changes there  Daughter concerned about BP medications.   She had falls and hallucinations for 3 days leading up to the hospitalization.  She was blacking out and fainting.    At hospital midodrine  was added to run BP higher to give her more margin to avoid hypotension.    Was doing well at time of discharge, but went to rehab center.  However, they took her off midodrine  and added back antihypertensives at rehab center although daughter cautioned against this.  Now BPs running low again, and worried she will have blacking out episodes again.     She takes pregabalin /Lyrica .  All of a sudden there was an issue at the pharmacy being able to get this medication.   Admit date:     12/12/2023  Discharge date: 12/21/23  Discharge Physician: Brandi Marker, DO    PCP: Brandi Alm RAMAN, PA-C    Recommendations at discharge:    Follow up with PCP w/in 1-2 weeks and repeat CBC, CMP, Mag, Phos w/in 1 week Follow up with Endocrinology w/in 1-2 weeks Follow up with Gastroenterology w/in 1-2 weeks   Discharge Diagnoses: Principal Problem:   Generalized weakness Active Problems:   Syncope   Elevated LFTs   Syncope and collapse   Resolved Problems:   * No resolved hospital problems. [facility] Course: The patient is a 78 year old Caucasian female with past  medical history significant for but not limited to chronic diastolic CHF, essential hypertension, hyperlipidemia, GERD, history of spinal fusion and chronic back pain and chronic osteomyelitis on suppressive antibiotic therapy, anxiety and depression who was recently diagnosed with abnormal LFTs and transaminitis which is being worked up in the outpatient setting presented to the ED with multiple falls, dizziness and lightheadedness as well as chronic back pain.  She described episodes of blacking out and falling.  In the ED she was noted to be hemodynamically stable and LFTs started improving.  She was found to have significant orthostatic hypotension with acute on chronic dizziness with postural changes.  She started clinically improving and orthostatic VS stable and improved.  Medically stable to be discharged at this time will need to follow-up with PCP, gastroenterology and endocrinology in outpatient setting within 1 to 2 weeks..    Assessment and Plan:   Multiple falls, severe orthostatic symptoms with history of orthostatic hypotension: Muscle relaxants discontinued.  Lyrica  continued. TED hose all the time as possible but change from knee-high to thigh High. Add Abdominal Binder.  Continue with Midodrine  2.5 mg twice daily.  All-time orthostatic precautions.  Continue mobility with PT OT. A.m. cortisol less than 2.  ACTH  stimulation test with normal findings.  Potassium is normal. Due to persistent orthostatics, trial of Prednisone  5 mg daily and will continue for now.  Ambulatory referral to Endocrine sent. Repeat Orthostatics today improved after bolus  w/ no symptoms.  She is medically stable to be discharged to SNF at this time.   Abnormal LFTs/Transaminitis / Hyperbilirubinemia: Recently been worked up as outpatient.  Continue follow-up.  LFTs are improving. AST and ALT Trend:          Recent Labs  Lab 11/28/23 1636 12/12/23 1602 12/13/23 0617 12/15/23 0641 12/20/23 0957 12/21/23 0545   AST 170* 426* 385* 263* 111* 83*  ALT 204* 414* 374* 293* 170* 129*  BILITOT 1.2 3.3* 2.8* 3.8* 2.5* 1.7*  ALKPHOS 86 108 101 94 93 79  -CTM and Trend and repeat CMP w/in 1 week and follow up with Gastroenterology    Chronic back pain, status post spinal fusion, chronic osteomyelitis of the L-spine: Patient on chronic suppressive therapy with Cefadroxil  500 mg po BID which is being continued.  Allergic to Acetaminophen  so will use Ibuprofen  400 mg po q8h. Already on Lyrica .  Pain controlled on current regimen.    Hypernatremia: Mild at 146. Placed on D5W for now and can be monitored and repeated in the output setting w/in 1 week.    Hypoglycemia: Mild. HbA1c was 5.5. Encourage her to eat and not miss meals. CBG Trend:           Recent Labs  Lab 12/20/23 0729 12/20/23 1217 12/20/23 1705 12/20/23 2230 12/21/23 0750 12/21/23 0812 12/21/23 1151  GLUCAP 82 116* 158* 155* 68* 99 130*  -She is on a Moderate Novolog  SSI AC and will discontinue.    Diarrhea: Mild. Hold Senna-Docusate 1 tab po at bedtime and Bisacodyl  5 mg po Dailyprn. Unlikely Infectious as she is afebrile and has no Leukocytosis. CTM and trail Antidiarrheals. Follow in the outpt setting.   Hypothyroidism: On Synthroid .  TSH is less than 0.1.  Decrease thyroxine from 75 mcg->> 50 mcg.  Will need to recheck in 1 month and has outpatient Endocrinologist referral    Renal Insufficiency: BUN/Cr Trend:        Recent Labs  Lab 12/12/23 1602 12/13/23 0147 12/15/23 0641 12/20/23 0957 12/21/23 0545  BUN 22 21 12  26* 24*  CREATININE 1.15* 0.96 0.84 1.03* 0.99  -Avoid Nephrotoxic Medications, Contrast Dyes, Hypotension and Dehydration to Ensure Adequate Renal Perfusion and will need to Renally Adjust Meds -Continue to Monitor and Trend Renal Function carefully and repeat CMP in the AM    GERD/GI Prophylaxis: On PPI w/ Pantoprazole  40 mg po BID and will continue    Gout: on Allopurinol  300 mg po qHS   Hypomagnesemia:  Replaced and Mag is now 2.2.  Also keep on his scheduled oral magnesium .   Hypokalemia: K+ was 3.4 and improved to 3.9. Replete w/ po KCL 40 mEQ BID x2 yesterday. CTM and Replete as Necessary. Repeat CMP in the AM   Confusion, hallucinations: Resolved. Hospital-acquired delirium.  Fall precautions.  Delirium precautions.  Avoid benzodiazepines.  Symptomatic management.  Improving and back to baseline as she is A and O x3   No other aggravating or relieving factors. No other complaint.  Past Medical History:  Diagnosis Date   AIN (anal intraepithelial neoplasia) anal canal    Anemia    with pregnancy   Anxiety    Arthritis    knees, lower back, knees   Chronic constipation    Chronic diastolic CHF (congestive heart failure) (HCC) 07/05/2019   CKD (chronic kidney disease)    Coarse tremors    Essential   COPD (chronic obstructive pulmonary disease) (HCC)    2017 chest CT, history  of   Degenerative lumbar spinal stenosis    Depression    Depression 07/05/2019   Drug dependency (HCC)    Elevated PTHrP level 05/09/2019   EtOH use 07/05/2019   Pt denies heavy drinking however   Gastroparesis    GERD (gastroesophageal reflux disease)    pt denies   Gout    Grade I diastolic dysfunction 02/01/2016   Noted ECHO   Hemorrhoids    History of adenomatous polyp of colon    History of basal cell carcinoma (BCC) excision 2015   nose   History of sepsis 05/14/2014   post lumbar surgery   History of shingles    x2   Hyperlipidemia    Hypertension    Hypothyroidism    Irregular heart beat    history of while undergoing radiation   Malignant neoplasm of upper-inner quadrant of left breast in female, estrogen receptor positive [facility]) oncologist-  dr layla--  per lov in epic no recurrence   dx 07-13-2015  Left breast invasive ductal carcinoma, Stage IA, Grade 1 (TXN0),  09-16-2015  s/p  left breast lumpectomy with sln bx's,  completed radiation 11-18-2015,  started antiestrogen therapy  12-14-2015   Neuropathy    Left foot and back of left leg   Obesity (BMI 30-39.9) 07/05/2019   Personal history of radiation therapy    completed 11-18-2015  left breast   Pneumonia    PONV (postoperative nausea and vomiting)    Prediabetes    diet controlled, no med   Restless leg syndrome    Seasonal allergies    Seizure (HCC) 05/2015   due to sepsis, only one time episode   Tremor    Wears partial dentures    lower    Current Outpatient Medications on File Prior to Visit  Medication Sig Dispense Refill   allopurinol  (ZYLOPRIM ) 300 MG tablet Take 1 tablet (300 mg total) by mouth daily. 90 tablet 1   Biotin  3 MG TABS Take 3 mg by mouth daily.     cefadroxil  (DURICEF) 500 MG capsule Take 1 capsule (500 mg total) by mouth 2 (two) times daily. 60 capsule 11   Cholecalciferol  (VITAMIN D3) 50 MCG (2000 UT) capsule Take 1 capsule (2,000 Units total) by mouth daily. 90 capsule 3   colchicine  0.6 MG tablet TAKE 2 TABLETS BY MOUTH ONCE, THEN 1 TABLET AN HOUR LATER, THEN 1 TABLET TWICE DAILY FOR 3 DAYS 30 tablet 2   cyanocobalamin  1000 MCG tablet Take 1,000 mcg by mouth daily.     lidocaine  (HM LIDOCAINE  PATCH) 4 % Place 1 patch onto the skin daily.     ondansetron  (ZOFRAN ) 4 MG tablet Take 1 tablet (4 mg total) by mouth every 6 (six) hours as needed for nausea. 20 tablet 0   polyethylene glycol powder (GLYCOLAX /MIRALAX ) 17 GM/SCOOP powder Take 17 g by mouth daily as needed for moderate constipation.     Propylene Glycol (SYSTANE BALANCE OP) Place 1 drop into both eyes 4 (four) times daily as needed (dry eyes).     senna-docusate (SENOKOT-S) 8.6-50 MG tablet Take 1 tablet by mouth at bedtime as needed for mild constipation.     No current facility-administered medications on file prior to visit.    ROS as in subjective   Objective: BP 96/60   Pulse (!) 119   Wt 161 lb 9.6 oz (73.3 kg)   SpO2 94%   BMI 24.94 kg/m   Wt Readings from Last 3 Encounters:  01/17/24 161  lb 9.6 oz (73.3  kg)  12/13/23 162 lb 7.7 oz (73.7 kg)  12/08/23 159 lb (72.1 kg)   BP Readings from Last 3 Encounters:  01/17/24 96/60  12/21/23 124/89  12/08/23 124/86   General: Well-developed, well-nourished no acute distress Alert and orient x 3 Heart regular rate and rhythm, normal S1-S2, no murmurs,+ tachycardia Lungs clear No lower extremity edema    Assessment: Encounter Diagnoses  Name Primary?   Syncope and collapse Yes   Weakness    Orthostasis    Elevated uric acid in blood    Chronic pain syndrome    Essential hypertension    Other specified hypothyroidism    Lumbar radiculopathy    Transaminitis    Gastroesophageal reflux disease, unspecified whether esophagitis present    Idiopathic gout, unspecified chronicity, unspecified site    Generalized weakness    Vitamin D  deficiency      Plan: Syncope and collapse -I reviewed her recent hospitalization records and discharge summary, medications, labs and recommendations -Medications reconciled and reviewed -Await labs from the rehab facility   Blood pressure fluctuations (hypertension and hypotension) Episodes of hypertension and hypotension. Discharged without blood pressure medication. Previously on losartan and HCT, now on amlodipine 2.5 mg daily. Midodrine  discontinued but needs reintroduction for hypotension. Current management under review. - Add back midodrine  2.5 mg twice daily with a meal - Monitor blood pressures. - Coordinate with healthcare providers for optimal management strategy. - Consider combination therapy if hypertension persists - Hold the amlodipine for the next several days until we see if the blood pressures are going to raise higher than 140/90 or not.  If blood pressures do resume over 140/90 then you can go back on the amlodipine 2.5 mg daily in the morning - Await endocrinology referral for hospitalization recommendation -    Chronic pain Chronic pain management difficult due to ibuprofen   ineffectiveness and liver concerns. Methocarbamol  provides some relief. - Continue methocarbamol  500 mg 3 times daily as needed - Continue lidocaine  patch as needed daily or Salonpas over-the-counter as an option -Continue pregabalin  Lyrica  50 mg three times daily for pain assuming insurance will cover this. -Follow-up with your pain management doctor as planned   Chronic infection, osteomyelitis of the spine Managed with cefadroxil . - Continue cefadroxil  as prescribed 500 mg twice daily.   Hypothyroidism - Continue levothyroxine /Synthroid  75 mcg daily, prefers levothyroxine  - Verify levothyroxine  recall and availability with pharmacy.   Gout Managed with allopurinol . - Continue allopurinol  300 mg daily. -Continue colchicine  0.6 mg, 2 tablets by mouth once, then 1 tablet an hour later, then 1 tablet twice a day for 3 days for flareup   Edema, improved Previously on furosemide , now managed with leg elevation and dietary changes. Lasix  held due to improved edema and blood pressure concerns. - Hold furosemide . - Encourage leg elevation and dietary salt restriction. - Monitor weight and fluid intake.   Liver dysfunction, improving Liver function tests improving. Awaiting recent results from [facility]. - Request recent liver function tests from [facility]. - Monitor liver function as needed. -Follow-up with gastroenterology as planned   Chronic insomnia -Continue amitriptyline  25 mg nightly -Was formally on trazodone  but did not seem to be worsening hallucinations and falls   Chronic GERD -Continue Protonix  pantoprazole  40 mg twice daily   Nausea -Continue Ivette Hailstone Zofran  4 mg every 6 hours as needed for nausea   Constipation -Continue senna docusate 1 tablet at bedtime as needed for constipation  Dry eyes -  Continue Systane drops in both eyes 4 times daily as needed for dry eyes   Vitamin D  deficiency -Continue vitamin D  2000 units  daily    Other supplements -Continue biotin  3 mg daily -Continue vitamin B12 cyanocobalamin  1000 mcg daily -Continue magnesium  oxide 400 mg daily   Discontinued medications -Do not resume Lasix  furosemide  fluid pill -Do not resume trazodone  for sleep -Do not resume prednisone  steroid -Do not resume any other muscle relaxer including tizanidine    Brandi Stephens was seen today for hospitalization follow-up.  Diagnoses and all orders for this visit:  Syncope and collapse -     Ambulatory referral to Cardiology  Weakness  Orthostasis  Elevated uric acid in blood  Chronic pain syndrome  Essential hypertension -     Ambulatory referral to Cardiology  Other specified hypothyroidism  Lumbar radiculopathy  Transaminitis  Gastroesophageal reflux disease, unspecified whether esophagitis present  Idiopathic gout, unspecified chronicity, unspecified site  Generalized weakness -     Ambulatory referral to Cardiology  Vitamin D  deficiency  Other orders -     levothyroxine  (SYNTHROID ) 75 MCG tablet; Take 1 tablet (75 mcg total) by mouth daily before breakfast. -     magnesium  oxide (MAG-OX) 400 (240 Mg) MG tablet; Take 1 tablet (400 mg total) by mouth daily. -     midodrine  (PROAMATINE ) 2.5 MG tablet; Take 1 tablet (2.5 mg total) by mouth 2 (two) times daily with a meal. -     pregabalin  (LYRICA ) 50 MG capsule; Take 1 capsule (50 mg total) by mouth 3 (three) times daily. -     amitriptyline  (ELAVIL ) 25 MG tablet; Take 1 tablet (25 mg total) by mouth at bedtime. -     methocarbamol  (ROBAXIN ) 500 MG tablet; Take 1 tablet (500 mg total) by mouth 3 (three) times daily as needed for muscle spasms. -     pantoprazole  (PROTONIX ) 40 MG tablet; Take 1 tablet (40 mg total) by mouth 2 (two) times daily.    F/u with endocrionlogy, GI

## 2024-01-18 ENCOUNTER — Telehealth: Payer: Self-pay | Admitting: Internal Medicine

## 2024-01-18 NOTE — Telephone Encounter (Unsigned)
 Copied from CRM #[id number]. Topic: Clinical - Home Health Verbal Orders >> Jan 18, 2024  3:10 PM Selinda RAMAN wrote: Caller/Agency: Sari PT with [facility] Callback Number: [phone] Service Requested: Physical Therapy Frequency: 1 x a week for 8 weeks Any new concerns about the patient? No this would just be for strengthening, endurance and safety in home.  Please assist patient further.

## 2024-01-19 NOTE — Telephone Encounter (Signed)
 Left detailed message on Sari VM about ok for PT

## 2024-01-24 DIAGNOSIS — R7401 Elevation of levels of liver transaminase levels: Secondary | ICD-10-CM | POA: Diagnosis not present

## 2024-01-25 ENCOUNTER — Ambulatory Visit: Payer: Self-pay | Admitting: Medical

## 2024-01-25 NOTE — Progress Notes (Signed)
 Results to MyChart

## 2024-01-27 ENCOUNTER — Other Ambulatory Visit: Payer: Self-pay | Admitting: Medical

## 2024-02-02 NOTE — Progress Notes (Unsigned)
 Cardiology Office Note:    Date:  02/05/2024   ID:  Brandi, Stephens [birth year], MRN [id number]  PCP:  Bulah Alm RAMAN, PA-C  Cardiologist:  None  APP: Orren Fabry, PA-C  Referring MD: Bulah Alm RAMAN, PA-C   Shortness of breath, peripheral edema  History of Present Illness:    Brandi Stephens is a 78 y.o. female with a hx of chronic diastolic CHF, hypertension, hyperlipidemia, hypothyroidism, GERD, left breast cancer status post lumpectomy/radiation/antiestrogen therapy in 2017, CKD stage IIIa, obesity, prediabetes, COPD, anxiety with depression, and gout who was last seen in consultation back in October 2022.  She presents today for a hospital follow-up appointment.  Patient follows Dr. Mona outpatient, she had a Holter monitor from October 2017 which showed sinus rhythm with tachycardia and infrequent PVCs.  Her last echocardiogram from March 2022 showed EF of 65 to 70%, no R MWA, moderate LVH, grade 1 diastolic dysfunction, normal RV no valvular disease, and mild dilatation of the ascending aorta 39 mm.  She had a recent low risk negative stress Myoview  with LVEF 60% from September 2022.  She was seen by Bruno Campi, PA-C on December 23, 2020 in the office for ongoing complaints of lower extremity edema and preop evaluation for gastric bypass surgery.  BNP was normal.  Her venous Doppler was negative for DVT last done on 09/21/2020 and 01/19/2021.  Her cardiac work-up was not suggestive of significant cardiomyopathy or ischemia.  Her leg edema was felt to be due to venous insufficiency and obesity.  She was referred to vascular surgery and no intervention was recommended.  She was told to wear compression stockings and weight loss, while continuing a low-sodium diet and continuing her p.o. Lasix  daily for chronic diastolic heart failure.  When she was evaluated for gastric bypass surgery she was having chronic DOE but no chest pain.  She was unable to complete 4 METS due to  deconditioning, weight, and stress stress.  She presented to [facility] emergency department on 01/18/2021 for shortness of breath.  Her PCP found her hypoxic with pulse ox in the high 80s on room air and tachycardic in the office.  She was placed on 4 L nasal cannula and sent to the ER for evaluation.  She complained of acute on chronic DOE and BLE edema 3 days prior to admission.  She states she is always short of breath with exertion, symptoms much worse when she visited the doctor due to anxiety.  She stated that she has been taking her Lasix  40 mg daily but feels it does not do much for her shortness of breath and swelling.  She was drinking a lot of water at baseline and not consuming much salt.  She stopped smoking 45 years ago but she did smoke half a pack a day for 50 years.  She states her COPD is mild and uses albuterol  and Advair at home.  She denied any chest pain or pressure at that time.  She does not do much at baseline due to her chronic back pain and obesity.  She was given IV Lasix  and an echocardiogram was performed on 01/20/2021 which showed EF of 60 to 65%, mild LVH, grade 1 diastolic dysfunction, and mild dilatation of ascending aorta 41 mm.  I saw her December 2022, she is not feeling well.  She endorses shortness of breath and still says that she has some peripheral edema.  She states that since she left the hospital her Bumex  was  increased from 1 mg to 2 mg daily.  She has had 1 episode of hypotension while in the shower.  She got dizzy and lightheaded but did not pass out at this time.  Otherwise at home her blood pressures have been in the 120s systolic.  She said at the time of this episode in the shower she has a pulse ox at home and was able to put on her finger and it showed her oxygen  was 82%.  She states she did fall about a month ago but she did not hit her head.  She landed on her bottom and the bedside table fell and hit her in the mouth.  She states that she has a fluttering  feeling in her chest sometimes several times a day but it only lasts for a few seconds.  We discussed her lab work today.  Her lipid panel looked good.  Her HDL was 53, LDL 95, total cholesterol 791.  Her triglycerides were elevated at 362.  Her triglycerides have never been at goal.  She is currently taking fish oil but I have written a prescription for Vascepa  today as well as provided her some samples.  Her EKG shows sinus tachycardia today rate of 118.  She states that her heart rate is usually around 100.  Her Toprol  XL was recently increased to 50 mg and she states that she feels better on this dose.  Since she has been switched to the higher dose of Bumex  her weight continues to go down.  She has been watching her diet and has been maintaining a low-sodium diet with a fluid restriction of 1 L a day.  She also takes magnesium  supplementation at night.  She has had a difficult time with chronic back pain over the years and has had 4 back surgeries.  She is on chronic narcotic medication at home for back pain.  She states that her shortness of breath is not a new problem.  She was recently hospitalized and given IV Lasix .  She does see a pulmonologist for her COPD and she has an appointment in January for PFTs.  She does occasionally have some chest pain but this mainly occurs during the time of palpitations..  Prior Lexi scans 2017, 2022 were normal.  Echoes have shown normal LV function.  Had a Zio patch that showed 1 triggered events with sinus tachycardia.  Admitted 07/18/2022 for nausea vomiting.  Was orthostatic.  Started on midodrine  and seen by cardiology.  Persistent vomiting since then.  Has been on Ozempic .  Stopped taking it in February 2024.  Had recent outpatient GI evaluation and there was concern for gastroparesis with Ozempic .  Started on Reglan .  During that appointment she felt weak and orthostatics were positive.  Noted persistent nausea and vomiting.  Today, she presents with a history of  orthostatic hypotension with episodes of syncope. She is accompanied by her daughter, who is her primary caregiver.  She has experienced four to five episodes of syncope due to orthostatic hypotension, with no episodes since hospitalization except one during rehabilitation. Initially managed with midodrine , her blood pressure treatment was adjusted multiple times, leading to confusion with current medications, which include both midodrine  and amlodipine. Home health nurses and physical therapists assist with blood pressure monitoring. Her syncope episode do not have a prodrome and she has never had palpitations right before.  She has congestive heart failure and was previously on diuretics. Currently, she is on a seven-day course of Lasix  (20 mg)  for significant edema in her feet and ankles. Her weight has been increasing, but the edema has slightly improved with Lasix . She monitors her weight daily.  She experiences random palpitations and was monitored in the hospital with telemetry for five days, which showed no significant arrhythmias.  She has had sinus tachycardia for years and has never been on a beta-blocker.  She has an enlarged left ventricle and a small aortic aneurysm, last evaluated in March of the previous year.  Reports no shortness of breath nor dyspnea on exertion. Reports no chest pain, pressure, or tightness. No edema, orthopnea, PND.   Discussed the use of AI scribe software for clinical note transcription with the patient, who gave verbal consent to proceed.  Past Medical History:  Diagnosis Date   AIN (anal intraepithelial neoplasia) anal canal    Anemia    with pregnancy   Anxiety    Arthritis    knees, lower back, knees   Chronic constipation    Chronic diastolic CHF (congestive heart failure) (HCC) 07/05/2019   CKD (chronic kidney disease)    Coarse tremors    Essential   COPD (chronic obstructive pulmonary disease) (HCC)    2017 chest CT, history of   Degenerative  lumbar spinal stenosis    Depression    Depression 07/05/2019   Drug dependency (HCC)    Elevated PTHrP level 05/09/2019   EtOH use 07/05/2019   Pt denies heavy drinking however   Gastroparesis    GERD (gastroesophageal reflux disease)    pt denies   Gout    Grade I diastolic dysfunction 02/01/2016   Noted ECHO   Hemorrhoids    History of adenomatous polyp of colon    History of basal cell carcinoma (BCC) excision 2015   nose   History of sepsis 05/14/2014   post lumbar surgery   History of shingles    x2   Hyperlipidemia    Hypertension    Hypothyroidism    Irregular heart beat    history of while undergoing radiation   Malignant neoplasm of upper-inner quadrant of left breast in female, estrogen receptor positive [facility]) oncologist-  dr layla--  per lov in epic no recurrence   dx 07-13-2015  Left breast invasive ductal carcinoma, Stage IA, Grade 1 (TXN0),  09-16-2015  s/p  left breast lumpectomy with sln bx's,  completed radiation 11-18-2015,  started antiestrogen therapy 12-14-2015   Neuropathy    Left foot and back of left leg   Obesity (BMI 30-39.9) 07/05/2019   Personal history of radiation therapy    completed 11-18-2015  left breast   Pneumonia    PONV (postoperative nausea and vomiting)    Prediabetes    diet controlled, no med   Restless leg syndrome    Seasonal allergies    Seizure (HCC) 05/2015   due to sepsis, only one time episode   Tremor    Wears partial dentures    lower     Past Surgical History:  Procedure Laterality Date   ABDOMINAL HYSTERECTOMY  1985   BASAL CELL CARCINOMA EXCISION  10/15   nose   BIOPSY  07/22/2022   Procedure: BIOPSY;  Surgeon: Aneita Gwendlyn DASEN, MD;  Location: [facility];  Service: Gastroenterology;;   BREAST BIOPSY Right 08/24/2015   BREAST BIOPSY Right 07/13/2015   BREAST BIOPSY Left 07/13/2015   BREAST EXCISIONAL BIOPSY Left 1972   BREAST LUMPECTOMY Left    BREAST LUMPECTOMY WITH RADIOACTIVE SEED AND SENTINEL LYMPH  NODE BIOPSY  Left 09/16/2015   Procedure: BREAST LUMPECTOMY WITH RADIOACTIVE SEED AND SENTINEL LYMPH NODE BIOPSY;  Surgeon: Jina Nephew, MD;  Location: [facility];  Service: General;  Laterality: Left;   BREAST REDUCTION SURGERY Bilateral 1998   CATARACT EXTRACTION W/ INTRAOCULAR LENS IMPLANT Left 2016   CERVICAL SPINE SURGERY  1995   hallo   COLONOSCOPY  01/2013   polyps   ESOPHAGOGASTRODUODENOSCOPY N/A 07/22/2022   Procedure: ESOPHAGOGASTRODUODENOSCOPY (EGD);  Surgeon: Aneita Gwendlyn DASEN, MD;  Location: [facility];  Service: Gastroenterology;  Laterality: N/A;   EYE SURGERY Left 2016   HAND SURGERY Left 02-19-2002   dr murrell  @[facility]    repair collateral ligament/ MPJ left thumb   HARDWARE REMOVAL N/A 07/24/2019   Procedure: REVISION OF HARDWARE Lumbar two - Sacral one. Extention of Fusion to Lumbar one;  Surgeon: Lanis Pupa, MD;  Location: [facility] OR;  Service: Neurosurgery;  Laterality: N/A;   HEMORRHOID SURGERY  2008   HIGH RESOLUTION ANOSCOPY N/A 02/28/2018   Procedure: HIGH RESOLUTION ANOSCOPY WITH BIOPSY;  Surgeon: Debby Hila, MD;  Location: [facility];  Service: General;  Laterality: N/A;   KNEE ARTHROSCOPY Right 2002   LUMBAR SPINE SURGERY  03-20-2015   dr lanis   fusion L4-5, L5-S1   MOUTH SURGERY  07/14/2018   2 infected teeth removed   MULTIPLE TOOTH EXTRACTIONS     with bone grafting lower bottom right and left   REDUCTION MAMMAPLASTY Bilateral    RIGHT COLECTOMY  09-10-2003   dr merrilyn @[facility]    multiple colon polyps (per path tubular adenoma's, hyperplastic , benign appendix, benign two lymph nodes   ROTATOR CUFF REPAIR Left 04/2016   TONSILLECTOMY  1969   TUBAL LIGATION Bilateral 1982    Current Medications: Current Meds  Medication Sig   allopurinol  (ZYLOPRIM ) 300 MG tablet Take 1 tablet (300 mg total) by mouth daily.   amitriptyline  (ELAVIL ) 25 MG tablet Take 1 tablet (25 mg total) by mouth at bedtime.   amLODipine (NORVASC) 2.5  MG tablet Take 2.5 mg by mouth daily.   Biotin  3 MG TABS Take 3 mg by mouth daily.   cefadroxil  (DURICEF) 500 MG capsule Take 1 capsule (500 mg total) by mouth 2 (two) times daily.   Cholecalciferol  (VITAMIN D3) 50 MCG (2000 UT) capsule Take 1 capsule (2,000 Units total) by mouth daily.   colchicine  0.6 MG tablet TAKE 2 TABLETS BY MOUTH ONCE, THEN 1 TABLET AN HOUR LATER, THEN 1 TABLET TWICE DAILY FOR 3 DAYS   cyanocobalamin  1000 MCG tablet Take 1,000 mcg by mouth daily.   furosemide  (LASIX ) 20 MG tablet Take 1 tablet (20 mg total) by mouth daily as needed for fluid.   levothyroxine  (SYNTHROID ) 75 MCG tablet Take 1 tablet (75 mcg total) by mouth daily before breakfast.   lidocaine  (HM LIDOCAINE  PATCH) 4 % Place 1 patch onto the skin daily.   magnesium  oxide (MAG-OX) 400 (240 Mg) MG tablet Take 1 tablet (400 mg total) by mouth daily.   methocarbamol  (ROBAXIN ) 500 MG tablet Take 1 tablet (500 mg total) by mouth 3 (three) times daily as needed for muscle spasms.   midodrine  (PROAMATINE ) 2.5 MG tablet Take 1 tablet (2.5 mg total) by mouth 2 (two) times daily with a meal.   ondansetron  (ZOFRAN ) 4 MG tablet Take 1 tablet (4 mg total) by mouth every 6 (six) hours as needed for nausea.   pantoprazole  (PROTONIX ) 40 MG tablet Take 1 tablet (40 mg total) by mouth 2 (  two) times daily.   polyethylene glycol powder (GLYCOLAX /MIRALAX ) 17 GM/SCOOP powder Take 17 g by mouth daily as needed for moderate constipation.   pregabalin  (LYRICA ) 50 MG capsule Take 1 capsule (50 mg total) by mouth 3 (three) times daily.   Propylene Glycol (SYSTANE BALANCE OP) Place 1 drop into both eyes 4 (four) times daily as needed (dry eyes).   senna-docusate (SENOKOT-S) 8.6-50 MG tablet Take 1 tablet by mouth at bedtime as needed for mild constipation.     Allergies:   Ampicillin , Gabapentin , Penicillins, Percocet [oxycodone -acetaminophen ], Crestor [rosuvastatin], Hydrocodone , Ozempic  (0.25 or 0.5 mg-dose) [semaglutide (0.25 or  0.5mg -dos)], and Other   Social History   Socioeconomic History   Marital status: Married    Spouse name: robert   Number of children: 3   Years of education: college   Highest education level: Not on file  Occupational History   Occupation: retired  Tobacco Use   Smoking status: Former    Current packs/day: 0.00    Average packs/day: 0.5 packs/day for 35.0 years (17.5 ttl pk-yrs)    Types: Cigarettes    Start date: 05/23/1983    Quit date: 05/22/2018    Years since quitting: 5.7   Smokeless tobacco: Never  Vaping Use   Vaping status: Never Used  Substance and Sexual Activity   Alcohol  use: Yes    Comment: 1 glass of wine a night   Drug use: Not Currently    Comment: CBD tincture   Sexual activity: Yes    Birth control/protection: Post-menopausal  Other Topics Concern   Not on file  Social History Narrative   Lives with husband in a 2 story home.  Has 2 living children.  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  Education: college.    Right-handed.   Five cups coffee per week.   Social Drivers of Corporate investment banker Strain: Low Risk  (05/02/2023)   Overall Financial Resource Strain (CARDIA)    Difficulty of Paying Living Expenses: Not hard at all  Food Insecurity: No Food Insecurity (12/13/2023)   Hunger Vital Sign    Worried About Running Out of Food in the Last Year: Never true    Ran Out of Food in the Last Year: Never true  Transportation Needs: No Transportation Needs (12/13/2023)   PRAPARE - Administrator, Civil Service (Medical): No    Lack of Transportation (Non-Medical): No  Physical Activity: Inactive (05/02/2023)   Exercise Vital Sign    Days of Exercise per Week: 0 days    Minutes of Exercise per Session: 0 min  Stress: Stress Concern Present (05/02/2023)   [employer] of Occupational Health - Occupational Stress Questionnaire    Feeling of Stress : To some extent  Social Connections: Socially Integrated (12/13/2023)   Social  Connection and Isolation Panel    Frequency of Communication with Friends and Family: More than three times a week    Frequency of Social Gatherings with Friends and Family: More than three times a week    Attends Religious Services: 1 to 4 times per year    Active Member of [employer] or Organizations: Yes    Attends Banker Meetings: 1 to 4 times per year    Marital Status: Married     Family History: The patient's family history includes Atrial fibrillation in her mother; Brain cancer in her maternal aunt; Breast cancer in her maternal aunt; Lung cancer in her maternal aunt and maternal uncle; Prostate cancer  in her maternal grandfather; Stomach cancer in her maternal grandmother; Stroke in her paternal grandmother; Tuberculosis in her paternal grandfather; Ulcers in her father. There is no history of Colon cancer, Esophageal cancer, or Rectal cancer.  ROS:   Please see the history of present illness.     EKGs/Labs/Other Studies Reviewed:    The following studies were reviewed today:  Zio monitor 06/2022 1 triggered event that was automatic for sinus tachycardia. No significant tachyarrhythmia or bradyarrhythmia. No atrial fibrillation or flutter.   Echocardiogram 06/27/2022  IMPRESSIONS     1. Left ventricular ejection fraction, by estimation, is 60 to 65%. The  left ventricle has normal function. The left ventricle has no regional  wall motion abnormalities. There is moderate left ventricular hypertrophy.  Left ventricular diastolic  parameters are consistent with Grade I diastolic dysfunction (impaired  relaxation).   2. Right ventricular systolic function is normal. The right ventricular  size is normal. There is normal pulmonary artery systolic pressure.   3. The mitral valve is normal in structure. No evidence of mitral valve  regurgitation. No evidence of mitral stenosis.   4. The aortic valve was not well visualized. Aortic valve regurgitation  is not  visualized. No aortic stenosis is present.   5. Aortic dilatation noted. There is borderline dilatation of the aortic  root, measuring 39 mm. There is mild dilatation of the ascending aorta,  measuring 40 mm.   6. The inferior vena cava is normal in size with <50% respiratory  variability, suggesting right atrial pressure of 8 mmHg.   Echocardiogram 01/20/2021 Impression: 1. Limited echo for LV function   2. Left ventricular ejection fraction, by estimation, is 60 to 65%. The  left ventricle has normal function. There is mild left ventricular  hypertrophy. Left ventricular diastolic parameters are consistent with  Grade I diastolic dysfunction (impaired  relaxation).   3. Aortic dilatation noted. There is mild dilatation of the ascending  aorta, measuring 41 mm.   4. The inferior vena cava is dilated in size with >50% respiratory  variability, suggesting right atrial pressure of 8 mmHg.   Comparison(s): Changes from prior study are noted. 07/07/2020: LVEF 65-70%.   FINDINGS   Left Ventricle: Left ventricular ejection fraction, by estimation, is 60  to 65%. The left ventricle has normal function. The left ventricular  internal cavity size was normal in size. There is mild left ventricular  hypertrophy. Left ventricular diastolic   parameters are consistent with Grade I diastolic dysfunction (impaired  relaxation). Indeterminate filling pressures.   Aorta: Aortic dilatation noted. There is mild dilatation of the ascending  aorta, measuring 41 mm.   Venous: The inferior vena cava is dilated in size with greater than 50%  respiratory variability, suggesting right atrial pressure of 8 mmHg.   EKG:  EKG is  ordered today.  The ekg ordered today demonstrates sinus tachycardia rate 118 bpm  Recent Labs: 12/14/2023: TSH <0.100 12/21/2023: ALT 129; BUN 24; Creatinine, Ser 0.99; Hemoglobin 13.1; Magnesium  2.2; Platelets 158; Potassium 3.9; Sodium 146  Recent Lipid Panel    Component Value  Date/Time   CHOL 219 (H) 11/03/2022 1137   TRIG 162 (H) 11/03/2022 1137   HDL 56 11/03/2022 1137   CHOLHDL 3.9 11/03/2022 1137   CHOLHDL 4.2 07/08/2016 1042   VLDL 52 (H) 07/08/2016 1042   LDLCALC 134 (H) 11/03/2022 1137    Physical Exam:    VS:  BP 118/70 (BP Location: Right Arm, Patient Position: Sitting, Cuff Size: Normal)  Pulse (!) 111   Ht 5' 7 (1.702 m)   Wt 169 lb (76.7 kg)   SpO2 98%   BMI 26.47 kg/m     Wt Readings from Last 3 Encounters:  02/05/24 169 lb (76.7 kg)  01/17/24 161 lb 9.6 oz (73.3 kg)  12/13/23 162 lb 7.7 oz (73.7 kg)     GEN:  Well nourished, well developed in no acute distress HEENT: Normal NECK: No JVD; No carotid bruits LYMPHATICS: No lymphadenopathy CARDIAC: sinus tachycardia, no murmurs, rubs, gallops RESPIRATORY:  Clear to auscultation without rales, wheezing or rhonchi  ABDOMEN: Soft, non-tender, non-distended MUSCULOSKELETAL:  No pitting edema; No deformity  SKIN: Warm and dry NEUROLOGIC:  Alert and oriented x 3 PSYCHIATRIC:  Normal affect    PLAN:     Orthostatic hypotension with recurrent syncope Recurrent syncope due to orthostatic hypotension. Current management includes midodrine  and amlodipine, with stable blood pressure at 118/70 mmHg. Discussed managing medications and goal to wean off midodrine . - Administer midodrine  twice daily. - Administer amlodipine at night to minimize daytime hypotension. - Encourage use of compression stockings. -encourage proper hydration   Palpitations and tachycardia Persistent tachycardia with heart rate around 100 bpm, exacerbated by activity and pain. Previous telemetry monitoring did not reveal significant arrhythmias. Considering underlying rhythm issues such as SVT. - Apply zio heart monitor for 14 days. - Evaluate heart monitor results for potential arrhythmias. - We will see what her heart rate monitor shows but if it is sinus tach, it would be reasonable to start low-dose beta-blocker  and potentially try her off the amlodipine  Chronic diastolic heart failure with lower extremity edema Chronic diastolic heart failure with lower extremity edema. Currently on Lasix  20 mg for significant edema. Discussed potential need for extending Lasix  if edema persists. No current kidney issues. - Continue Lasix  20 mg for 7 days. - Extend Lasix  for an additional 7 days if edema persists (if this is needed would encourage labs, BMP) - Monitor daily weight. - Encourage use of compression stockings. -update an echo  Thoracic aortic aneurysm Thoracic aortic aneurysm previously measured at 40 mm. No recent imaging since March of last year. - Order echocardiogram to evaluate thoracic aortic aneurysm.  Multiple lumbar spinal fusions with compression fracture and adjacent segment disease Lumbar spinal fusions with current compression fracture and adjacent segment disease. Candidate for a fifth surgery, but considering risks and recovery time, surgery is being avoided.  Thyroid  - Fluctuates between hyper and hypo-.  Last TSH was less than 0.100 - Working with PCP to regulate -this could of course be contributing to her HR  Medication Adjustments/Labs and Tests Ordered: Current medicines are reviewed at length with the patient today.  Concerns regarding medicines are outlined above.  Orders Placed This Encounter  Procedures   LONG TERM MONITOR (3-14 DAYS)   EKG 12-Lead   ECHOCARDIOGRAM COMPLETE   Meds ordered this encounter  Medications   furosemide  (LASIX ) 20 MG tablet    Sig: Take 1 tablet (20 mg total) by mouth daily as needed for fluid.    Dispense:  30 tablet    Refill:  0   Please follow-up in 6-8 weeks with me.   Signed, Orren LOISE Fabry, PA-C  02/05/2024 12:32 PM    [facility]

## 2024-02-05 ENCOUNTER — Ambulatory Visit (INDEPENDENT_AMBULATORY_CARE_PROVIDER_SITE_OTHER)

## 2024-02-05 ENCOUNTER — Ambulatory Visit: Attending: Physician Assistant | Admitting: Physician Assistant

## 2024-02-05 VITALS — BP 118/70 | HR 111 | Ht 67.0 in | Wt 169.0 lb

## 2024-02-05 DIAGNOSIS — E079 Disorder of thyroid, unspecified: Secondary | ICD-10-CM

## 2024-02-05 DIAGNOSIS — R000 Tachycardia, unspecified: Secondary | ICD-10-CM

## 2024-02-05 DIAGNOSIS — E0590 Thyrotoxicosis, unspecified without thyrotoxic crisis or storm: Secondary | ICD-10-CM | POA: Diagnosis not present

## 2024-02-05 DIAGNOSIS — I10 Essential (primary) hypertension: Secondary | ICD-10-CM | POA: Diagnosis not present

## 2024-02-05 DIAGNOSIS — E782 Mixed hyperlipidemia: Secondary | ICD-10-CM | POA: Diagnosis not present

## 2024-02-05 DIAGNOSIS — R002 Palpitations: Secondary | ICD-10-CM

## 2024-02-05 DIAGNOSIS — R55 Syncope and collapse: Secondary | ICD-10-CM | POA: Diagnosis not present

## 2024-02-05 DIAGNOSIS — I5032 Chronic diastolic (congestive) heart failure: Secondary | ICD-10-CM

## 2024-02-05 DIAGNOSIS — M48061 Spinal stenosis, lumbar region without neurogenic claudication: Secondary | ICD-10-CM

## 2024-02-05 MED ORDER — FUROSEMIDE 20 MG PO TABS: 20.0000 mg | ORAL_TABLET | Freq: Every day | ORAL | 0 refills | Status: AC | PRN

## 2024-02-05 NOTE — Progress Notes (Unsigned)
 Applied a 14 day Zio XT monitor to patient in the office  Hilty to read

## 2024-02-05 NOTE — Patient Instructions (Addendum)
 Medication Instructions:  Can take an additional Lasix  daily as needed-CHECK YOUR WEIGHT DAILY *If you need a refill on your cardiac medications before your next appointment, please call your pharmacy*  Lab Work: None ordered If you have labs (blood work) drawn today and your tests are completely normal, you will receive your results only by: MyChart Message (if you have MyChart) OR A paper copy in the mail If you have any lab test that is abnormal or we need to change your treatment, we will call you to review the results.  Testing/Procedures: Your physician has requested that you have an echocardiogram. Echocardiography is a painless test that uses sound waves to create images of your heart. It provides your doctor with information about the size and shape of your heart and how well your heart's chambers and valves are working. This procedure takes approximately one hour. There are no restrictions for this procedure. Please do NOT wear cologne, perfume, aftershave, or lotions (deodorant is allowed). Please arrive 15 minutes prior to your appointment time.  Please note: We ask at that you not bring children with you during ultrasound (echo/ vascular) testing. Due to room size and safety concerns, children are not allowed in the ultrasound rooms during exams. Our front office staff cannot provide observation of children in our lobby area while testing is being conducted. An adult accompanying a patient to their appointment will only be allowed in the ultrasound room at the discretion of the ultrasound technician under special circumstances. We apologize for any inconvenience.   ZIO XT- Long Term Monitor Instructions  Your physician has requested you wear a ZIO patch monitor for 14 days.  This is a single patch monitor. [employer] supplies one patch monitor per enrollment. Additional stickers are not available. Please do not apply patch if you will be having a Nuclear Stress Test,   Echocardiogram, Cardiac CT, MRI, or Chest Xray during the period you would be wearing the  monitor. The patch cannot be worn during these tests. You cannot remove and re-apply the  ZIO XT patch monitor.  Your ZIO patch monitor will be mailed 3 day USPS to your address on file. It may take 3-5 days  to receive your monitor after you have been enrolled.  Once you have received your monitor, please review the enclosed instructions. Your monitor  has already been registered assigning a specific monitor serial # to you.  Billing and Patient Assistance Program Information  We have supplied [employer] with any of your insurance information on file for billing purposes. [employer] offers a sliding scale Patient Assistance Program for patients that do not have  insurance, or whose insurance does not completely cover the cost of the ZIO monitor.  You must apply for the Patient Assistance Program to qualify for this discounted rate.  To apply, please call [employer] at [phone], select option 4, select option 2, ask to apply for  Patient Assistance Program. M[employer] will ask your household income, and how many people  are in your household. They will quote your out-of-pocket cost based on that information.  [employer] will also be able to set up a 9-month, interest-free payment plan if needed.  Applying the monitor   Shave hair from upper left chest.  Hold abrader disc by orange tab. Rub abrader in 40 strokes over the upper left chest as  indicated in your monitor instructions.  Clean area with 4 enclosed alcohol  pads. Let dry.  Apply patch as indicated in monitor instructions. Patch will be  placed under collarbone on left  side of chest with arrow pointing upward.  Rub patch adhesive wings for 2 minutes. Remove white label marked 1. Remove the white  label marked 2. Rub patch adhesive wings for 2 additional minutes.  While looking in a mirror, press and release button in center of patch. A small  green light will  flash 3-4 times. This will be your only indicator that the monitor has been turned on.  Do not shower for the first 24 hours. You may shower after the first 24 hours.  Press the button if you feel a symptom. You will hear a small click. Record Date, Time and  Symptom in the Patient Logbook.  When you are ready to remove the patch, follow instructions on the last 2 pages of Patient  Logbook. Stick patch monitor onto the last page of Patient Logbook.  Place Patient Logbook in the blue and white box. Use locking tab on box and tape box closed  securely. The blue and white box has prepaid postage on it. Please place it in the mailbox as  soon as possible. Your physician should have your test results approximately 7 days after the  monitor has been mailed back to [facility].  Call [facility] Customer Care at [phone] if you have questions regarding  your ZIO XT patch monitor. Call them immediately if you see an orange light blinking on your  monitor.  If your monitor falls off in less than 4 days, contact our Monitor department at [phone].  If your monitor becomes loose or falls off after 4 days call [employer] at [phone] for  suggestions on securing your monitor   Follow-Up: At [facility], you and your health needs are our priority.  As part of our continuing mission to provide you with exceptional heart care, our providers are all part of one team.  This team includes your primary Cardiologist (physician) and Advanced Practice Providers or APPs (Physician Assistants and Nurse Practitioners) who all work together to provide you with the care you need, when you need it.  Your next appointment:   6-8 week(s)  Provider:   Orren Fabry, PA-C      We recommend signing up for the patient portal called MyChart.  Sign up information is provided on this After Visit Summary.  MyChart is used to connect with patients for Virtual Visits (Telemedicine).   Patients are able to view lab/test results, encounter notes, upcoming appointments, etc.  Non-urgent messages can be sent to your provider as well.   To learn more about what you can do with MyChart, go to [email or web address].   Other Instructions Please get some ted hose or compression stockings. They can be purchased at your local medical supply store, [employer], [employer] or [employer] fund[employer].  Put them on first thing in the morning and wear them during the day . Elevate your feet during the day and remove hose in the evening before bed.  CONTINUE TO MONITOR YOUR BLOOD PRESSURE AND HEART RATE

## 2024-02-06 ENCOUNTER — Telehealth: Payer: Self-pay

## 2024-02-06 ENCOUNTER — Other Ambulatory Visit: Payer: Self-pay | Admitting: Medical

## 2024-02-06 ENCOUNTER — Other Ambulatory Visit: Payer: Self-pay | Admitting: Internal Medicine

## 2024-02-06 MED ORDER — VITAMIN D3 50 MCG (2000 UT) PO CAPS: 2000.0000 [IU] | ORAL_CAPSULE | Freq: Every day | ORAL | 3 refills | Status: AC

## 2024-02-06 MED ORDER — ALLOPURINOL 300 MG PO TABS: 300.0000 mg | ORAL_TABLET | Freq: Every day | ORAL | 1 refills | Status: DC

## 2024-02-06 MED ORDER — PANTOPRAZOLE SODIUM 40 MG PO TBEC: 40.0000 mg | DELAYED_RELEASE_TABLET | Freq: Two times a day (BID) | ORAL | 1 refills | Status: DC

## 2024-02-06 MED ORDER — PREGABALIN 50 MG PO CAPS: 50.0000 mg | ORAL_CAPSULE | Freq: Three times a day (TID) | ORAL | 1 refills | Status: DC

## 2024-02-06 MED ORDER — MAGNESIUM OXIDE -MG SUPPLEMENT 400 (240 MG) MG PO TABS: 400.0000 mg | ORAL_TABLET | Freq: Every day | ORAL | 0 refills | Status: DC

## 2024-02-06 MED ORDER — MIDODRINE HCL 2.5 MG PO TABS: 2.5000 mg | ORAL_TABLET | Freq: Two times a day (BID) | ORAL | 3 refills | Status: AC

## 2024-02-06 MED ORDER — METHOCARBAMOL 500 MG PO TABS: 500.0000 mg | ORAL_TABLET | Freq: Three times a day (TID) | ORAL | 1 refills | Status: AC | PRN

## 2024-02-06 MED ORDER — AMITRIPTYLINE HCL 25 MG PO TABS: 25.0000 mg | ORAL_TABLET | Freq: Every day | ORAL | 0 refills | Status: DC

## 2024-02-06 MED ORDER — LEVOTHYROXINE SODIUM 75 MCG PO TABS: 75.0000 ug | ORAL_TABLET | Freq: Every day | ORAL | 0 refills | Status: AC

## 2024-02-06 MED ORDER — COLCHICINE 0.6 MG PO TABS: ORAL_TABLET | ORAL | 2 refills | Status: AC

## 2024-02-06 NOTE — Telephone Encounter (Signed)
 refilled

## 2024-02-06 NOTE — Progress Notes (Signed)
 [facility] and I put on the message to pharmacy to mail meds. They might with it being a 24 hour pharmacy

## 2024-02-06 NOTE — Progress Notes (Unsigned)
 Can you send in Lyrica  for patient as I had to update the medication to say to mail to patient

## 2024-02-06 NOTE — Telephone Encounter (Signed)
 Copied from CRM [id number]. Topic: Clinical - Prescription Issue >> Feb 06, 2024 10:17 AM Kevelyn M wrote: Reason for CRM: Daughter is calling to request all of her mother's medication be mailed to her because her mother is elderly. Pharmacy told her to call Provider to make this request. Call Back # [phone]  [id number] - [place],  - [address] AT [facility] [address], [place] [address] Phone: [phone]  Fax: [phone]

## 2024-02-07 ENCOUNTER — Ambulatory Visit: Admitting: Gastroenterology

## 2024-02-07 ENCOUNTER — Encounter: Payer: Self-pay | Admitting: Gastroenterology

## 2024-02-07 VITALS — BP 118/82 | HR 114

## 2024-02-07 DIAGNOSIS — R194 Change in bowel habit: Secondary | ICD-10-CM | POA: Diagnosis not present

## 2024-02-07 DIAGNOSIS — R748 Abnormal levels of other serum enzymes: Secondary | ICD-10-CM

## 2024-02-07 DIAGNOSIS — Z860100 Personal history of colon polyps, unspecified: Secondary | ICD-10-CM | POA: Diagnosis not present

## 2024-02-07 MED ORDER — AMITRIPTYLINE HCL 25 MG PO TABS: 50.0000 mg | ORAL_TABLET | Freq: Every day | ORAL | 1 refills | Status: DC

## 2024-02-07 NOTE — Progress Notes (Signed)
 HPI :  78 year old female here for a follow-up visit for elevated liver enzymes, as well as some bowel symptoms she wished to discuss today.  I have not seen her since July 2024.   She has had recurrent problems with falls secondary to hypotension/orthostasis.  She was hospitalized in June at [facility] from which no records are available.  She states she was hospitalized after a bad fall, she is not sure if she had an infection and treated with antibiotics.  The previous liver enzymes in the chart before that were in October 2024 were normal.  When she got out of the hospital and had follow-up labs on July 1 with her primary care she was found to have a significant transaminitis - ALT 589, AST 394, AP 85, T bil 1.1. INR was normal at the time.  Primary care had reached out to me and we were not sure if this was secondary to drug-induced liver injury versus shock liver if she had been hypotensive during that hospitalization.  There was no liver enzyme to correlate with the peak was.  She had a right upper quadrant ultrasound which looked okay and then a Doppler of her liver which showed patent vasculature.  She had acute viral hepatitis workup and autoimmune labs which were negative.  We decided to trend her labs and over the last few months her liver enzymes have slowly but consistently downtrended.  Her last liver enzymes were done on October 9 and were entirely normal.  She was unfortunately admitted to the hospital again in August after multiple falls thought to be related to hypotension/orthostasis.  She is followed by cardiology.  She was started on midodrine  to help her blood pressure.  During that time her liver enzymes continue to downtrend.  She also had a UTI somewhere during this time over the summer.  Of note she reminds me she has osteomyelitis for which she is followed by infectious disease and takes chronic antibiotics, specifically cefadroxil  500 mg twice daily.  She has been on  this for some time and remained on it throughout her liver enzyme elevation.  She also takes allopurinol  for gout and has remained on that for some time as well.  She denies any other new medications to her chronic list.  The other issue she complains of has been changes in her bowel since she has been out of the hospital.  She has been having more liquid stool and has a hard time holding it depending on the position, she can have some leakage.  She has been wearing diapers to minimize problems with this.  She is having 1 bowel movement per day at baseline it is just looser than usual.  She previously had constipation and was on MiraLAX  and she has not been taking that.  She had a colonoscopy with me in October 2024, she had some polyps removed.  Generally we were able to visualize most of her colon except her lower left colon was limited by prep and could not clear it, I had suggested a repeat flex sig within a year.  She states she is still recovering her hospitalizations and falls and does not feel ready to do flex sig now but perhaps in the upcoming months.  Of note she has been followed with cardiology, has a Holter monitor ongoing and awaiting a follow-up echo as well.  Of note she takes magnesium  supplement which has been added recently, nightly.  She also takes Elavil  25 mg nightly  for insomnia.   Recall she had a right-sided colectomy due to large advanced polyp in the past in 2014.  I did a colonoscopy for her in 2019 and multiple adenomas were removed.  I recommended a 3-year follow-up exam.  Of note she had hypertrophied anal papillae that tested positive for AIN I had referred her to colorectal surgery at that time.  She states she thinks she saw them but does not recall her last follow-up with them. Hypertrophied anal papillae noted on prior colonoscopy last year, no evidence of AIN.        EGD 07/22/22: - Ectopic gastric mucosa in the upper third of the esophagus. - Focal scar c/w small  healed ulcer site was found in the prepyloric region of the stomach. - Three gastric body polyps. Biopsied. - Erythematous mucosa in the stomach. Biopsied. - A medium amount of food (residue) in the stomach that obscured a portion of the stomach. - Duodenal lipoma. Biopsied.  FINAL MICROSCOPIC DIAGNOSIS:  A. DUODENAL POLYPOID LESION, BIOPSY: Benign duodenal mucosa with no diagnostic abnormality Abundant submucosal adipose suggestive of a benign submucosal lipoma (see comment)  B. STOMACH, BODY, POLYPECTOMY: Compatible with hyperplastic polyp Reactive gastropathy with foveolar hyperplasia Incidental gastric xanthoma Negative for H. pylori, intestinal metaplasia, dysplasia and carcinoma  C. STOMACH, ANTRUM, BODY, BIOPSY: Reactive gastropathy and minimal chronic gastritis Negative for H. pylori, intestinal metaplasia, dysplasia and carcinoma  COMMENT:  Deeper sections of the duodenal polypoid lesion are reviewed.  Sections show 2 fragments of duodenal mucosa exhibiting a normal villous architecture and the normal complement of mononuclear cells within the lamina propria.  1 fragment shows increased submucosal adipose and there is a separate fragment of mature adipose without inflammation or atypia. These features are suggestive of a benign submucosal lipoma.  There is no evidence of dysplasia or carcinoma.   Colonoscopy 01/24/2023: - Skin tags were found on perianal exam. - There was evidence of a prior end-to-side ileo-colonic anastomosis in the ascending colon. This was patent and was characterized by healthy appearing mucosa. - A 3 mm polyp was found in the transverse colon. The polyp was sessile. The polyp was removed with a cold snare. Resection and retrieval were complete. - Multiple small-mouthed diverticula were found in the transverse colon and left colon. - Anal papilla(e) were hypertrophied. Biopsies were taken with a cold forceps for histology to rule out AIN. - A large amount  of semi-liquid stool with residual vegetable matter was found in the entire colon, making visualization difficult. Extensive lavage of the colon was performed which prolonged the exam. Mostly adequate views were obtained throughout, however the rectum and rectosigmoid had the most burden of residual vegetable matter that was difficult to completely clear. - The exam was otherwise without abnormality.  FINAL DIAGNOSIS        1. Surgical [P], colon, transverse, polyp (1) :       - TUBULAR ADENOMA       - NEGATIVE FOR HIGH-GRADE DYSPLASIA OR MALIGNANCY       - MULTIPLE STEP SECTIONS WERE EXAMINED        2. Surgical [P], colon, rectum :       - ANAL SQUAMOUS MUCOSA WITH REACTIVE CHANGES       - NEGATIVE FOR DYSPLASIA   RUQ US  12/12/23: IMPRESSION: 1. Unremarkable right upper quadrant ultrasound. 2. Contracted gallbladder.    RUQ US  doppler 11/15/23: IMPRESSION: Portal venous system is patent with normal direction of flow.   Labs 01/25/24 -  ALT 11, AST 17, AP 87, t bil normal      Past Medical History:  Diagnosis Date   AIN (anal intraepithelial neoplasia) anal canal    Anemia    with pregnancy   Anxiety    Arthritis    knees, lower back, knees   Chronic constipation    Chronic diastolic CHF (congestive heart failure) (HCC) 07/05/2019   CKD (chronic kidney disease)    Coarse tremors    Essential   COPD (chronic obstructive pulmonary disease) (HCC)    2017 chest CT, history of   Degenerative lumbar spinal stenosis    Depression    Depression 07/05/2019   Drug dependency (HCC)    Elevated PTHrP level 05/09/2019   EtOH use 07/05/2019   Pt denies heavy drinking however   Gastroparesis    GERD (gastroesophageal reflux disease)    pt denies   Gout    Grade I diastolic dysfunction 02/01/2016   Noted ECHO   Hemorrhoids    History of adenomatous polyp of colon    History of basal cell carcinoma (BCC) excision 2015   nose   History of sepsis 05/14/2014   post lumbar  surgery   History of shingles    x2   Hyperlipidemia    Hypertension    Hypothyroidism    Irregular heart beat    history of while undergoing radiation   Malignant neoplasm of upper-inner quadrant of left breast in female, estrogen receptor positive [facility]) oncologist-  dr layla--  per lov in epic no recurrence   dx 07-13-2015  Left breast invasive ductal carcinoma, Stage IA, Grade 1 (TXN0),  09-16-2015  s/p  left breast lumpectomy with sln bx's,  completed radiation 11-18-2015,  started antiestrogen therapy 12-14-2015   Neuropathy    Left foot and back of left leg   Obesity (BMI 30-39.9) 07/05/2019   Personal history of radiation therapy    completed 11-18-2015  left breast   Pneumonia    PONV (postoperative nausea and vomiting)    Prediabetes    diet controlled, no med   Restless leg syndrome    Seasonal allergies    Seizure (HCC) 05/2015   due to sepsis, only one time episode   Tremor    Wears partial dentures    lower      Past Surgical History:  Procedure Laterality Date   ABDOMINAL HYSTERECTOMY  1985   BASAL CELL CARCINOMA EXCISION  10/15   nose   BIOPSY  07/22/2022   Procedure: BIOPSY;  Surgeon: Aneita Gwendlyn DASEN, MD;  Location: [facility];  Service: Gastroenterology;;   BREAST BIOPSY Right 08/24/2015   BREAST BIOPSY Right 07/13/2015   BREAST BIOPSY Left 07/13/2015   BREAST EXCISIONAL BIOPSY Left 1972   BREAST LUMPECTOMY Left    BREAST LUMPECTOMY WITH RADIOACTIVE SEED AND SENTINEL LYMPH NODE BIOPSY Left 09/16/2015   Procedure: BREAST LUMPECTOMY WITH RADIOACTIVE SEED AND SENTINEL LYMPH NODE BIOPSY;  Surgeon: Jina Nephew, MD;  Location: [facility];  Service: General;  Laterality: Left;   BREAST REDUCTION SURGERY Bilateral 1998   CATARACT EXTRACTION W/ INTRAOCULAR LENS IMPLANT Left 2016   CERVICAL SPINE SURGERY  1995   hallo   COLONOSCOPY  01/2013   polyps   ESOPHAGOGASTRODUODENOSCOPY N/A 07/22/2022   Procedure: ESOPHAGOGASTRODUODENOSCOPY (EGD);   Surgeon: Aneita Gwendlyn DASEN, MD;  Location: [facility];  Service: Gastroenterology;  Laterality: N/A;   EYE SURGERY Left 2016   HAND SURGERY Left 02-19-2002   dr murrell  @[facility]   repair collateral ligament/ MPJ left thumb   HARDWARE REMOVAL N/A 07/24/2019   Procedure: REVISION OF HARDWARE Lumbar two - Sacral one. Extention of Fusion to Lumbar one;  Surgeon: Lanis Pupa, MD;  Location: [facility] OR;  Service: Neurosurgery;  Laterality: N/A;   HEMORRHOID SURGERY  2008   HIGH RESOLUTION ANOSCOPY N/A 02/28/2018   Procedure: HIGH RESOLUTION ANOSCOPY WITH BIOPSY;  Surgeon: Debby Hila, MD;  Location: [facility];  Service: General;  Laterality: N/A;   KNEE ARTHROSCOPY Right 2002   LUMBAR SPINE SURGERY  03-20-2015   dr lanis   fusion L4-5, L5-S1   MOUTH SURGERY  07/14/2018   2 infected teeth removed   MULTIPLE TOOTH EXTRACTIONS     with bone grafting lower bottom right and left   REDUCTION MAMMAPLASTY Bilateral    RIGHT COLECTOMY  09-10-2003   dr merrilyn @[facility]    multiple colon polyps (per path tubular adenoma's, hyperplastic , benign appendix, benign two lymph nodes   ROTATOR CUFF REPAIR Left 04/2016   TONSILLECTOMY  1969   TUBAL LIGATION Bilateral 1982   Family History  Problem Relation Age of Onset   Atrial fibrillation Mother    Ulcers Father    Breast cancer Maternal Aunt    Lung cancer Maternal Uncle    Stomach cancer Maternal Grandmother    Lung cancer Maternal Aunt    Brain cancer Maternal Aunt    Prostate cancer Maternal Grandfather    Stroke Paternal Grandmother    Tuberculosis Paternal Grandfather    Colon cancer Neg Hx    Esophageal cancer Neg Hx    Rectal cancer Neg Hx    Social History   Tobacco Use   Smoking status: Former    Current packs/day: 0.00    Average packs/day: 0.5 packs/day for 35.0 years (17.5 ttl pk-yrs)    Types: Cigarettes    Start date: 05/23/1983    Quit date: 05/22/2018    Years since quitting: 5.7   Smokeless tobacco: Never   Vaping Use   Vaping status: Never Used  Substance Use Topics   Alcohol  use: Yes    Comment: 1 glass of wine a night   Drug use: Not Currently    Comment: CBD tincture   Current Outpatient Medications  Medication Sig Dispense Refill   allopurinol  (ZYLOPRIM ) 300 MG tablet Take 1 tablet (300 mg total) by mouth daily. 90 tablet 1   amitriptyline  (ELAVIL ) 25 MG tablet Take 1 tablet (25 mg total) by mouth at bedtime. 90 tablet 0   amLODipine (NORVASC) 2.5 MG tablet Take 2.5 mg by mouth daily.     Biotin  3 MG TABS Take 3 mg by mouth daily.     cefadroxil  (DURICEF) 500 MG capsule Take 1 capsule (500 mg total) by mouth 2 (two) times daily. 60 capsule 11   Cholecalciferol  (VITAMIN D3) 50 MCG (2000 UT) capsule Take 1 capsule (2,000 Units total) by mouth daily. 90 capsule 3   colchicine  0.6 MG tablet TAKE 2 TABLETS BY MOUTH ONCE, THEN 1 TABLET AN HOUR LATER, THEN 1 TABLET TWICE DAILY FOR 3 DAYS 30 tablet 2   cyanocobalamin  1000 MCG tablet Take 1,000 mcg by mouth daily.     furosemide  (LASIX ) 20 MG tablet Take 1 tablet (20 mg total) by mouth daily as needed for fluid. 30 tablet 0   levothyroxine  (SYNTHROID ) 75 MCG tablet Take 1 tablet (75 mcg total) by mouth daily before breakfast. 90 tablet 0   lidocaine  (HM LIDOCAINE  PATCH) 4 %  Place 1 patch onto the skin daily.     magnesium  oxide (MAG-OX) 400 (240 Mg) MG tablet Take 1 tablet (400 mg total) by mouth daily. 90 tablet 0   methocarbamol  (ROBAXIN ) 500 MG tablet Take 1 tablet (500 mg total) by mouth 3 (three) times daily as needed for muscle spasms. 90 tablet 1   midodrine  (PROAMATINE ) 2.5 MG tablet Take 1 tablet (2.5 mg total) by mouth 2 (two) times daily with a meal. 60 tablet 3   ondansetron  (ZOFRAN ) 4 MG tablet Take 1 tablet (4 mg total) by mouth every 6 (six) hours as needed for nausea. 20 tablet 0   polyethylene glycol powder (GLYCOLAX /MIRALAX ) 17 GM/SCOOP powder Take 17 g by mouth daily as needed for moderate constipation.     pregabalin  (LYRICA )  50 MG capsule Take 1 capsule (50 mg total) by mouth 3 (three) times daily. 90 capsule 1   Propylene Glycol (SYSTANE BALANCE OP) Place 1 drop into both eyes 4 (four) times daily as needed (dry eyes).     senna-docusate (SENOKOT-S) 8.6-50 MG tablet Take 1 tablet by mouth at bedtime as needed for mild constipation. (Patient not taking: Reported on 02/07/2024)     No current facility-administered medications for this visit.   Allergies  Allergen Reactions   Ampicillin  Hives and Other (See Comments)    Severe reaction in February 2017 1.5 month to have hives to go away   Gabapentin  Swelling    UNSPECIFIED REACTION    Penicillins Hives, Other (See Comments) and Rash    Severe reaction in February 2017 1.5 month to have hives to go away  Has patient had a PCN reaction causing immediate rash, facial/tongue/throat swelling, SOB or lightheadedness with hypotension: #  #  #  YES  #  #  #   Has patient had a PCN reaction causing severe rash involving mucus membranes or skin necrosis: No  Has patient had a PCN reaction that required hospitalization No  Has patient had a PCN reaction occurring within the last 10 years: No   Percocet [Oxycodone -Acetaminophen ] Hives and Itching   Crestor [Rosuvastatin]     Mental fog, myalgia    Hydrocodone  Itching    Can tolerate with benadryl    Ozempic  (0.25 Or 0.5 Mg-Dose) [Semaglutide (0.25 Or 0.5mg -Dos)] Nausea Only   Other Itching    UNSPECIFIED Analgesics     Review of Systems: All systems reviewed and negative except where noted in HPI.   Lab Results  Component Value Date   ALT 129 (H) 12/21/2023   AST 83 (H) 12/21/2023   ALKPHOS 79 12/21/2023   BILITOT 1.7 (H) 12/21/2023    Last LFTs 10/9 - normal  Physical Exam: BP 118/82   Pulse (!) 114   SpO2 98%  Constitutional: Pleasant,well-developed, female in no acute distress. Neurological: Alert and oriented to person place and time. Psychiatric: Normal mood and affect. Behavior is  normal.   ASSESSMENT: 78 y.o. female here for assessment of the following  1. Elevated liver enzymes   2. Altered bowel habits   3. History of colon polyps    As above she had significant transaminitis following a hospitalization, noted on July 1 during follow-up.  Over time her enzymes have steadily and slowly downtrended until normalization this past month which is good news obviously.  We discussed DDx for what could have caused this.  She tested negative for autoimmune hepatitis and viral hepatitis.  My main suspicion is that she had a drug-induced liver injury (DILI) during  her hospitalization at [facility], we do not have specific records of what she had, sounds like maybe she had a different antibiotic which caused this.  She has had orthostasis and hypotension with falls, however despite this and hospitalization since that time for this issue her liver enzymes have continued to slowly downtrend.  If due to shock liver hypertension we would expect more fluctuation in a faster recovery.  This enzyme elevation took months to downtrend until normalization.  Again unclear what medication could have done this at the time as we do not have those records, the patient will look into this.  She does take chronic antibiotics but has been on that before and after this elevation as well as allopurinol  which would be unlikely to be the culprit if she continued it with normalization.  At this time I recommend we repeat her liver enzymes in 6 weeks.  Her cardiology team is managing her orthostasis to keep her blood pressure controlled and avoid further falls.  Otherwise her main issue has been altered bowel habits with looser stools than usual will and difficulty with leakage.  I reviewed her medications and she is taking a magnesium  supplement every night.  I recommend she stop that as it could be related.  Also will try increasing her Elavil  to 50 mg nightly as it helps with her insomnia and she tolerates it  well.  In regards to her leakage she could have component of pelvic floor weakening/dysfunction, she declined DRE today and was not interested in pursuing pelvic floor PT for now.  She is due for a flex sig given limited prep on her last exam.  She is willing to do this but wants to do it after the new year to give her more time to recover.  If her symptoms persist following flex sig and change in regimen she would be more willing to do pelvic floor PT but she wants to hold off on that for now  PLAN: - suspected DILI but unclear culprit drug, she will work on records from [facility] hospitalization and see what antibiotic or new drug she had at tha time - trend LFTs at this time, repeat LFTs in 6 weeks - hold magnesium  supplement for now - increase Elavil  to 50mg  q HS as tolerated, watch for somnolence - declined DRE / pelvic floor PT for now, wants to await flex sig - flex sig at the [facility] in January - will call to schedule when schedule opens  Marcey Naval, MD [facility]

## 2024-02-07 NOTE — Patient Instructions (Addendum)
 Hold Magnesium  supplement.    Increase Elavil  to 50 mg daily at bedtime. We have sent a new prescription to your pharmacy.   You will be due for liver function labs in 6 weeks.  We will reach out to you when it is time to go to the lab.  It has been recommended to you by your physician that you have a Flexible Sigmoidoscopy completed. Per your request, we did not schedule the procedure(s) today. We will contact you in a few weeks to get you scheduled for January 2026. You will be scheduled for a pre-visit and procedure at that time.   Thank you for entrusting me with your care and for choosing [facility], Dr. Elspeth Naval    _______________________________________________________  If your blood pressure at your visit was 140/90 or greater, please contact your primary care physician to follow up on this.  _______________________________________________________  If you are age 77 or older, your body mass index should be between 23-30. Your There is no height or weight on file to calculate BMI. If this is out of the aforementioned range listed, please consider follow up with your Primary Care Provider.  If you are age 77 or younger, your body mass index should be between 19-25. Your There is no height or weight on file to calculate BMI. If this is out of the aformentioned range listed, please consider follow up with your Primary Care Provider.   ________________________________________________________  The [facility] providers would like to encourage you to use MYCHART to communicate with providers for non-urgent requests or questions.  Due to long hold times on the telephone, sending your provider a message by [facility] may be a faster and more efficient way to get a response.  Please allow 48 business hours for a response.  Please remember that this is for non-urgent requests.  _______________________________________________________  [facility] is using a  team-based approach to care.  Your team is made up of your doctor and two to three APPS. Our APPS (Nurse Practitioners and Physician Assistants) work with your physician to ensure care continuity for you. They are fully qualified to address your health concerns and develop a treatment plan. They communicate directly with your gastroenterologist to care for you. Seeing the Advanced Practice Practitioners on your physician's team can help you by facilitating care more promptly, often allowing for earlier appointments, access to diagnostic testing, procedures, and other specialty referrals.

## 2024-02-09 ENCOUNTER — Telehealth: Payer: Self-pay | Admitting: Internal Medicine

## 2024-02-09 DIAGNOSIS — J431 Panlobular emphysema: Secondary | ICD-10-CM

## 2024-02-09 DIAGNOSIS — E119 Type 2 diabetes mellitus without complications: Secondary | ICD-10-CM

## 2024-02-09 NOTE — Telephone Encounter (Signed)
 Placed the pharmacy referral, will get her on my schedule. Thanks!

## 2024-02-09 NOTE — Telephone Encounter (Signed)
 [name]  was notified.  Pt has an appt 11/12 and we can do TSH then

## 2024-02-09 NOTE — Telephone Encounter (Signed)
 Copied from CRM #[id number]. Topic: Clinical - Home Health Verbal Orders >> Feb 09, 2024  9:28 AM [name] B wrote: Caller/Agency: [name] with [facility] Callback Number: [phone] Service Requested: Skilled Nursing Frequency: 1 time a week for 5 weeks Any new concerns about the patient? Does she need to have another TSH level?

## 2024-02-15 ENCOUNTER — Telehealth: Payer: Self-pay

## 2024-02-15 NOTE — Progress Notes (Signed)
 Care Guide Pharmacy Note  02/15/2024 Name: Brandi Stephens MRN: [id number] DOB: 1945-04-30  Referred By: Bulah Alm RAMAN, PA-C Reason for referral: Complex Care Management (Outreach to schedule referral with PharmD)   Brandi Stephens is a 78 y.o. year old female who is a primary care patient of Bulah Alm RAMAN DEVONNA.  Brandi Stephens was referred to the pharmacist for assistance related to: COPD and DMII  An unsuccessful telephone outreach was attempted today to contact the patient who was referred to the pharmacy team for assistance with medication management. Additional attempts will be made to contact the patient.  [facility]  [facility], [facility] Guide  Direct Dial: [phone]  Fax [phone]

## 2024-02-21 DIAGNOSIS — R55 Syncope and collapse: Secondary | ICD-10-CM

## 2024-02-21 NOTE — Progress Notes (Signed)
 Complex Care Management Note Care Guide Note  02/21/2024 Name: Brandi Stephens MRN: [id number] DOB: August 06, 1945   Complex Care Management Outreach Attempts: An unsuccessful telephone outreach was attempted today to offer the patient information about available complex care management services.  Follow Up Plan:  No further outreach attempts will be made at this time. We have been unable to contact the patient to offer or enroll patient in complex care management services.  Encounter Outcome:  Patient Refused  .Debbe Fuse [facility], [facility] Guide  Direct Dial: [phone]  Fax [phone]

## 2024-02-27 ENCOUNTER — Ambulatory Visit (INDEPENDENT_AMBULATORY_CARE_PROVIDER_SITE_OTHER): Admitting: Medical

## 2024-02-27 VITALS — BP 120/70 | HR 84 | Wt 176.0 lb

## 2024-02-27 DIAGNOSIS — Z981 Arthrodesis status: Secondary | ICD-10-CM

## 2024-02-27 DIAGNOSIS — E038 Other specified hypothyroidism: Secondary | ICD-10-CM

## 2024-02-27 DIAGNOSIS — E790 Hyperuricemia without signs of inflammatory arthritis and tophaceous disease: Secondary | ICD-10-CM

## 2024-02-27 DIAGNOSIS — E782 Mixed hyperlipidemia: Secondary | ICD-10-CM

## 2024-02-27 DIAGNOSIS — G8929 Other chronic pain: Secondary | ICD-10-CM | POA: Diagnosis not present

## 2024-02-27 DIAGNOSIS — R0989 Other specified symptoms and signs involving the circulatory and respiratory systems: Secondary | ICD-10-CM

## 2024-02-27 DIAGNOSIS — E559 Vitamin D deficiency, unspecified: Secondary | ICD-10-CM

## 2024-02-27 DIAGNOSIS — G4700 Insomnia, unspecified: Secondary | ICD-10-CM | POA: Diagnosis not present

## 2024-02-27 DIAGNOSIS — M549 Dorsalgia, unspecified: Secondary | ICD-10-CM | POA: Diagnosis not present

## 2024-02-27 MED ORDER — MELOXICAM 7.5 MG PO TABS: 7.5000 mg | ORAL_TABLET | Freq: Every day | ORAL | 0 refills | Status: DC

## 2024-02-27 NOTE — Progress Notes (Signed)
 Subjective: Chief Complaint  Patient presents with   Follow-up    6 week follow-up. No recent falls. Still has some dizziness but will pass within a minute or so. New concerns- weight gain- was doing ozempic  with patient assistance    Patient Care Team: Baeleigh Devincent, Alm RAMAN, PA-C as PCP - General (Family Medicine) Mona, Vinie BROCKS, MD as PCP - Cardiology (Cardiology) Magrinat, Sandria BROCKS, MD as Consulting Physician (Oncology) Aron Shoulders, MD as Consulting Physician (General Surgery) Joyce Norleen BROCKS, MD as Consulting Physician (Family Medicine) Lanis Pupa, MD as Consulting Physician (Neurosurgery) Jadine Charleston, MD as Consulting Physician (Dermatology) Crawford, Morna Pickle, NP as Nurse Practitioner (Hematology and Oncology) Josefina Chew, MD as Consulting Physician (Orthopedic Surgery) Mindi Mt, MD (Inactive) as Consulting Physician (Anesthesiology) Armbruster, Elspeth SQUIBB, MD as Consulting Physician (Gastroenterology) Lanis Pupa, MD as Consulting Physician (Neurosurgery) Onita Duos, MD as Consulting Physician (Neurology) Liane Sharyne MATSU, [facility] as Pharmacist (Pharmacist)  Here for med check.  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  She experiences significant back pain, primarily in the middle to lower back, exacerbated by a hx/o vertebral fracture and adjacent segment disease. She reports that this is the first day in three days she has been able to move due to her back pain. She is currently on Lyrica  for neuropathy but wants something else to help with pain. She avoids regular use of over-the-counter pain medications due to concerns about kidney health, though she has used Advil  occasionally without significant relief.  She has a history of lumbar spine fusion and is undergoing physical therapy, although certain exercises are avoided  due to her condition. She reports that she can walk well when she is not experiencing severe pain. She is on antibiotics for chronic osteomyelitis and is not using massage therapy.  She experiences her worst pain in the morning and has tried ibuprofen  and Tylenol  in the past with limited success. She is concerned about the impact of medications on her kidneys and liver, as she has had fluctuating liver function tests in the past.  She monitors her blood pressure, which has been stable recently. She is on low dose amlodipine, and in recent months midodrine  for blood pressure management.  She has a history of gastroparesis, affecting her digestion. She was previously on Ozempic  for weight management, which helped with weight loss but is concerned about its impact on her digestive system. She has gained some weight recently and is considering options for weight management.  She has a history of fluctuating thyroid  levels and is awaiting an endocrinology appointment. She is on thyroid  medication and monitors her levels regularly.  She has a history of impaired glucose tolerance, with past A1c levels in the diabetic range, but recent tests have shown improvement. She is not currently monitoring her blood sugar at home.    Past Medical History:  Diagnosis Date   AIN (anal intraepithelial neoplasia) anal canal    Anemia    with pregnancy   Anxiety    Arthritis    knees, lower back, knees   Chronic constipation    Chronic diastolic CHF (congestive heart failure) (HCC) 07/05/2019   CKD (chronic kidney disease)    Coarse tremors    Essential   COPD (chronic obstructive pulmonary disease) (HCC)    2017 chest CT, history of   Degenerative lumbar spinal stenosis    Depression  Depression 07/05/2019   Drug dependency (HCC)    Elevated PTHrP level 05/09/2019   EtOH use 07/05/2019   Pt denies heavy drinking however   Gastroparesis    GERD (gastroesophageal reflux disease)    pt denies   Gout     Grade I diastolic dysfunction 02/01/2016   Noted ECHO   Hemorrhoids    History of adenomatous polyp of colon    History of basal cell carcinoma (BCC) excision 2015   nose   History of sepsis 05/14/2014   post lumbar surgery   History of shingles    x2   Hyperlipidemia    Hypertension    Hypothyroidism    Irregular heart beat    history of while undergoing radiation   Malignant neoplasm of upper-inner quadrant of left breast in female, estrogen receptor positive [facility]) oncologist-  dr layla--  per lov in epic no recurrence   dx 07-13-2015  Left breast invasive ductal carcinoma, Stage IA, Grade 1 (TXN0),  09-16-2015  s/p  left breast lumpectomy with sln bx's,  completed radiation 11-18-2015,  started antiestrogen therapy 12-14-2015   Neuropathy    Left foot and back of left leg   Obesity (BMI 30-39.9) 07/05/2019   Personal history of radiation therapy    completed 11-18-2015  left breast   Pneumonia    PONV (postoperative nausea and vomiting)    Prediabetes    diet controlled, no med   Restless leg syndrome    Seasonal allergies    Seizure (HCC) 05/2015   due to sepsis, only one time episode   Tremor    Wears partial dentures    lower    Current Outpatient Medications on File Prior to Visit  Medication Sig Dispense Refill   allopurinol  (ZYLOPRIM ) 300 MG tablet Take 1 tablet (300 mg total) by mouth daily. 90 tablet 1   amitriptyline  (ELAVIL ) 25 MG tablet Take 2 tablets (50 mg total) by mouth at bedtime. 60 tablet 1   amLODipine (NORVASC) 2.5 MG tablet Take 2.5 mg by mouth daily.     Biotin  3 MG TABS Take 3 mg by mouth daily.     cefadroxil  (DURICEF) 500 MG capsule Take 1 capsule (500 mg total) by mouth 2 (two) times daily. 60 capsule 11   Cholecalciferol  (VITAMIN D3) 50 MCG (2000 UT) capsule Take 1 capsule (2,000 Units total) by mouth daily. 90 capsule 3   colchicine  0.6 MG tablet TAKE 2 TABLETS BY MOUTH ONCE, THEN 1 TABLET AN HOUR LATER, THEN 1 TABLET TWICE DAILY FOR 3  DAYS 30 tablet 2   cyanocobalamin  1000 MCG tablet Take 1,000 mcg by mouth daily.     furosemide  (LASIX ) 20 MG tablet Take 1 tablet (20 mg total) by mouth daily as needed for fluid. (Patient taking differently: Take 20 mg by mouth daily.) 30 tablet 0   levothyroxine  (SYNTHROID ) 75 MCG tablet Take 1 tablet (75 mcg total) by mouth daily before breakfast. 90 tablet 0   lidocaine  (HM LIDOCAINE  PATCH) 4 % Place 1 patch onto the skin daily.     methocarbamol  (ROBAXIN ) 500 MG tablet Take 1 tablet (500 mg total) by mouth 3 (three) times daily as needed for muscle spasms. 90 tablet 1   midodrine  (PROAMATINE ) 2.5 MG tablet Take 1 tablet (2.5 mg total) by mouth 2 (two) times daily with a meal. 60 tablet 3   ondansetron  (ZOFRAN ) 4 MG tablet Take 1 tablet (4 mg total) by mouth every 6 (six) hours as needed for  nausea. 20 tablet 0   pantoprazole  (PROTONIX ) 40 MG tablet Take 40 mg by mouth 2 (two) times daily.     polyethylene glycol powder (GLYCOLAX /MIRALAX ) 17 GM/SCOOP powder Take 17 g by mouth daily as needed for moderate constipation.     pregabalin  (LYRICA ) 50 MG capsule Take 1 capsule (50 mg total) by mouth 3 (three) times daily. 90 capsule 1   Propylene Glycol (SYSTANE BALANCE OP) Place 1 drop into both eyes 4 (four) times daily as needed (dry eyes).     senna-docusate (SENOKOT-S) 8.6-50 MG tablet Take 1 tablet by mouth at bedtime as needed for mild constipation.     No current facility-administered medications on file prior to visit.    Past Surgical History:  Procedure Laterality Date   ABDOMINAL HYSTERECTOMY  1985   BASAL CELL CARCINOMA EXCISION  10/15   nose   BIOPSY  07/22/2022   Procedure: BIOPSY;  Surgeon: Aneita Gwendlyn DASEN, MD;  Location: [facility];  Service: Gastroenterology;;   BREAST BIOPSY Right 08/24/2015   BREAST BIOPSY Right 07/13/2015   BREAST BIOPSY Left 07/13/2015   BREAST EXCISIONAL BIOPSY Left 1972   BREAST LUMPECTOMY Left    BREAST LUMPECTOMY WITH RADIOACTIVE SEED AND  SENTINEL LYMPH NODE BIOPSY Left 09/16/2015   Procedure: BREAST LUMPECTOMY WITH RADIOACTIVE SEED AND SENTINEL LYMPH NODE BIOPSY;  Surgeon: Jina Nephew, MD;  Location: [facility];  Service: General;  Laterality: Left;   BREAST REDUCTION SURGERY Bilateral 1998   CATARACT EXTRACTION W/ INTRAOCULAR LENS IMPLANT Left 2016   CERVICAL SPINE SURGERY  1995   hallo   COLONOSCOPY  01/2013   polyps   ESOPHAGOGASTRODUODENOSCOPY N/A 07/22/2022   Procedure: ESOPHAGOGASTRODUODENOSCOPY (EGD);  Surgeon: Aneita Gwendlyn DASEN, MD;  Location: [facility];  Service: Gastroenterology;  Laterality: N/A;   EYE SURGERY Left 2016   HAND SURGERY Left 02-19-2002   dr murrell  @[facility]    repair collateral ligament/ MPJ left thumb   HARDWARE REMOVAL N/A 07/24/2019   Procedure: REVISION OF HARDWARE Lumbar two - Sacral one. Extention of Fusion to Lumbar one;  Surgeon: Lanis Pupa, MD;  Location: [facility];  Service: Neurosurgery;  Laterality: N/A;   HEMORRHOID SURGERY  2008   HIGH RESOLUTION ANOSCOPY N/A 02/28/2018   Procedure: HIGH RESOLUTION ANOSCOPY WITH BIOPSY;  Surgeon: Debby Hila, MD;  Location: [facility];  Service: General;  Laterality: N/A;   KNEE ARTHROSCOPY Right 2002   LUMBAR SPINE SURGERY  03-20-2015   dr lanis   fusion L4-5, L5-S1   MOUTH SURGERY  07/14/2018   2 infected teeth removed   MULTIPLE TOOTH EXTRACTIONS     with bone grafting lower bottom right and left   REDUCTION MAMMAPLASTY Bilateral    RIGHT COLECTOMY  09-10-2003   dr merrilyn @[facility]    multiple colon polyps (per path tubular adenoma's, hyperplastic , benign appendix, benign two lymph nodes   ROTATOR CUFF REPAIR Left 04/2016   TONSILLECTOMY  1969   TUBAL LIGATION Bilateral 1982   ROS as in subjective    Objective: BP 120/70   Pulse 84   Wt 176 lb (79.8 kg)   BMI 27.57 kg/m   Wt Readings from Last 3 Encounters:  02/27/24 176 lb (79.8 kg)  02/05/24 169 lb (76.7 kg)  01/17/24 161 lb 9.6 oz (73.3 kg)    BP Readings from Last 3 Encounters:  02/27/24 120/70  02/07/24 118/82  02/05/24 118/70   General appearence: alert, no distress, WD/WN, seated with walker Heart: RRR, normal  S1, S2, no murmurs Lungs: CTA bilaterally, no wheezes, rhonchi, or rales Back: tender lumbar midline and left paraspinal regoin Pulses: 2+ symmetric, upper and bilat 1+ lower extremities, normal cap refill No significant LE edema     Assessment: Encounter Diagnoses  Name Primary?   Chronic back pain, unspecified back location, unspecified back pain laterality Yes   Vitamin D  deficiency    Status post lumbar spinal fusion    Mixed dyslipidemia    Other specified hypothyroidism    Insomnia, unspecified type    Labile blood pressure    Elevated uric acid in blood        Plan: Chronic lumbar back pain -Current management includes Lyrica  and muscle relaxers. Physical therapy is ongoing but limited by fear of exacerbating the fracture.  -Meloxicam  considered for pain management with caution due to potential kidney impact. She wants to avoid tylenol  in light of recent elevated LFTs that resolved recently - Prescribed meloxicam  once daily, preferably in the morning. - advised recheck with Dr. Lorilee for further evaluation and management. - Encouraged continuation of physical therapy with modifications. - Discussed potential use of supplements like turmeric and omega-3s.  Chronic osteomyelitis, lumbar spine Chronic osteomyelitis managed with antibiotics, through infectious disease  Gastroparesis Recent weight gain noted, possibly related to dietary changes. Concerns about GLP-1 agonists due to potential exacerbation of symptoms. Trulicity  considered as a milder alternative to Ozempic . - Consulted with gastroenterologist regarding Trulicity  for weight management. - Continue to monitor weight and dietary intake.  Orthostatic hypotension Managed with amlodipine and midodrine . Blood pressure well-managed.  Cardiologist plans to eventually discontinue midodrine . - Continue current regimen of amlodipine and midodrine . - Monitor blood pressure regularly.  Paroxysmal supraventricular tachycardia Episodes noted on recent heart monitor. Echocardiogram scheduled. - Proceed with scheduled echocardiogram, follow up with cardiology as planned Edema, lower extremities No significant edema or late -prior lasix  use, but this was held at hospitalization 12/2023  Impaired fasting glucose / diabetes in remission Impaired fasting glucose with previous A1c levels in diabetic range. recent A1c is 5.5.  Discussion about implications of previous diabetic label for insurance. Consideration of Trulicity  for weight management with caution due to potential impact on gastroparesis. - Continue to monitor blood glucose levels. - Discuss with gastroenterologist about Trulicity  for weight management.  Other specified thyroid  disease Fluctuating thyroid  levels. Referral to endocrinology was made but not yet scheduled due to hospitalization. - Ordered thyroid  function tests. - she will reschedule endocrinology appointment.  Insomnia Managed with amitriptyline , increased to 50 mg by gastroenterologist. - Continue amitriptyline  per GI  Hyperuricemia, on allopurinol  Managed with allopurinol . No recent gout flare-ups reported. - Ordered uric acid level test. -consider lower dose or gradual discontinue pending level check  General Health Maintenance Discussion about the importance of a balanced diet and regular exercise. Encouragement to maintain a healthy lifestyle to manage weight and overall health. - Encouraged a balanced diet with fruits, vegetables, and lean proteins. - Promoted regular physical activity as tolerated.   Brandi Stephens was seen today for follow-up.  Diagnoses and all orders for this visit:  Chronic back pain, unspecified back location, unspecified back pain laterality  Vitamin D  deficiency -      TSH+T4F+T3Free+ThyAbs+TPO+VD25-[facility]  Status post lumbar spinal fusion  Mixed dyslipidemia -     Lipid panel  Other specified hypothyroidism -     Parathyroid  hormone, intact (no Ca) -     TSH+T4F+T3Free+ThyAbs+TPO+VD25-[facility]  Insomnia, unspecified type  Labile blood pressure -     Parathyroid  hormone,  intact (no Ca)  Elevated uric acid in blood -     Uric acid  Other orders -     meloxicam  (MOBIC ) 7.5 MG tablet; Take 1 tablet (7.5 mg total) by mouth daily.    F/u pending labs, call back

## 2024-02-28 ENCOUNTER — Ambulatory Visit: Payer: Self-pay | Admitting: Medical

## 2024-02-28 ENCOUNTER — Ambulatory Visit: Payer: Self-pay | Admitting: Physician Assistant

## 2024-02-28 LAB — URIC ACID: Uric Acid: 3.8 mg/dL (ref 3.1–7.9)

## 2024-02-28 LAB — LIPID PANEL
Chol/HDL Ratio: 3.3 ratio (ref 0.0–4.4)
Cholesterol, Total: 230 mg/dL — ABNORMAL HIGH (ref 100–199)
HDL: 70 mg/dL (ref >39)
LDL Chol Calc (NIH): 140 mg/dL — ABNORMAL HIGH (ref 0–99)
Triglycerides: 113 mg/dL (ref 0–149)
VLDL Cholesterol Cal: 20 mg/dL (ref 5–40)

## 2024-02-28 LAB — TSH+T4F+T3FREE+THYABS+TPO+VD25
Free T4: 1.27 ng/dL (ref 0.82–1.77)
T3, Free: 2.6 pg/mL (ref 2.0–4.4)
TSH: 0.757 u[IU]/mL (ref 0.450–4.500)
Thyroglobulin Antibody: <1 [IU]/mL (ref 0.0–0.9)
Thyroperoxidase Ab SerPl-aCnc: 11 [IU]/mL (ref 0–34)
Vit D, 25-Hydroxy: 47.9 ng/mL (ref 30.0–100.0)

## 2024-02-28 LAB — PARATHYROID HORMONE, INTACT (NO CA): PTH: 51 pg/mL (ref 15–65)

## 2024-02-28 MED ORDER — METOPROLOL TARTRATE 25 MG PO TABS: 12.5000 mg | ORAL_TABLET | Freq: Two times a day (BID) | ORAL | 3 refills | Status: AC

## 2024-02-28 NOTE — Progress Notes (Signed)
 Spoke to patient about Brandi Stephens's recommendation. Pt will think about it and give us  a call back.

## 2024-02-29 ENCOUNTER — Telehealth: Payer: Self-pay

## 2024-02-29 ENCOUNTER — Ambulatory Visit: Payer: Self-pay | Admitting: Medical

## 2024-02-29 ENCOUNTER — Ambulatory Visit (INDEPENDENT_AMBULATORY_CARE_PROVIDER_SITE_OTHER)

## 2024-02-29 DIAGNOSIS — R55 Syncope and collapse: Secondary | ICD-10-CM | POA: Diagnosis not present

## 2024-02-29 DIAGNOSIS — E079 Disorder of thyroid, unspecified: Secondary | ICD-10-CM

## 2024-02-29 DIAGNOSIS — I5032 Chronic diastolic (congestive) heart failure: Secondary | ICD-10-CM

## 2024-02-29 LAB — ECHOCARDIOGRAM COMPLETE
Area-P 1/2: 3.85 cm2
S' Lateral: 3.19 cm

## 2024-02-29 NOTE — Telephone Encounter (Signed)
 Called and LM for patient to call back and schedule her Flex Sig in the [facility] with Dr. Leigh - January schedule is open. Dx: history of Colon polyps

## 2024-02-29 NOTE — Progress Notes (Signed)
 Results through MyChart  Hello [name]  I saw you added metoprolol  yesterday given her event monitor results.  Does she need to hold off on the amlodipine for now since she is also on midodrine  and has a issue with prior significant drops in blood pressure and hypotension?  I just wanted to make sure I was in the loop on this  Thanks [employer]

## 2024-03-01 ENCOUNTER — Other Ambulatory Visit: Payer: Self-pay | Admitting: Medical

## 2024-03-01 MED ORDER — TRULICITY 0.75 MG/0.5ML ~~LOC~~ SOAJ: 0.7500 mg | SUBCUTANEOUS | 0 refills | Status: AC

## 2024-03-01 MED ORDER — ALLOPURINOL 100 MG PO TABS: 200.0000 mg | ORAL_TABLET | Freq: Every day | ORAL | 1 refills | Status: DC

## 2024-03-02 ENCOUNTER — Other Ambulatory Visit: Payer: Self-pay | Admitting: Physician Assistant

## 2024-03-05 NOTE — Telephone Encounter (Signed)
 Spoke with pt regarding test results. Pt verbalized understanding. Pt stated he BP has been running 114/82 since she has started Lopressor  12.5 mg twice daily. Pt asked about her monitor results. Pt monitor results was reviewed. Pt stated she had a recent episode of fast HR while her daughter was in critical condition, but since then have not had any issues. Pt was advised to call our office if she has any questions or concerns.

## 2024-03-13 ENCOUNTER — Telehealth: Payer: Self-pay | Admitting: Internal Medicine

## 2024-03-13 NOTE — Telephone Encounter (Signed)
 Copied from CRM #[id number]. Topic: Clinical - Home Health Verbal Orders >> Mar 13, 2024  4:30 PM [name] B wrote: Caller/Agency: [facility] Callback Number: [phone] Service Requested: Physical Therapy Frequency: Once a week for 8 weeks Any new concerns about the patient? No

## 2024-03-16 ENCOUNTER — Encounter: Payer: Self-pay | Admitting: Gastroenterology

## 2024-03-18 NOTE — Telephone Encounter (Signed)
 Left detailed message with verbal orders on carries vm

## 2024-03-19 NOTE — Progress Notes (Deleted)
 Cardiology Office Note:    Date:  03/19/2024   ID:  Katie, Faraone 1945-07-28, MRN [id number]  PCP:  Bulah Alm RAMAN, PA-C  Cardiologist:  None  APP: Orren Fabry, PA-C  Referring MD: Bulah Alm RAMAN, PA-C   Shortness of breath, peripheral edema  History of Present Illness:    Brandi Stephens is a 78 y.o. female with a hx of chronic diastolic CHF, hypertension, hyperlipidemia, hypothyroidism, GERD, left breast cancer status post lumpectomy/radiation/antiestrogen therapy in 2017, CKD stage IIIa, obesity, prediabetes, COPD, anxiety with depression, and gout who was last seen in consultation back in October 2022.  She presents today for a hospital follow-up appointment.  Patient follows Dr. Mona outpatient, she had a Holter monitor from October 2017 which showed sinus rhythm with tachycardia and infrequent PVCs.  Her last echocardiogram from March 2022 showed EF of 65 to 70%, no R MWA, moderate LVH, grade 1 diastolic dysfunction, normal RV no valvular disease, and mild dilatation of the ascending aorta 39 mm.  She had a recent low risk negative stress Myoview  with LVEF 60% from September 2022.  She was seen by Bruno Campi, PA-C on December 23, 2020 in the office for ongoing complaints of lower extremity edema and preop evaluation for gastric bypass surgery.  BNP was normal.  Her venous Doppler was negative for DVT last done on 09/21/2020 and 01/19/2021.  Her cardiac work-up was not suggestive of significant cardiomyopathy or ischemia.  Her leg edema was felt to be due to venous insufficiency and obesity.  She was referred to vascular surgery and no intervention was recommended.  She was told to wear compression stockings and weight loss, while continuing a low-sodium diet and continuing her p.o. Lasix  daily for chronic diastolic heart failure.  When she was evaluated for gastric bypass surgery she was having chronic DOE but no chest pain.  She was unable to complete 4 METS due to  deconditioning, weight, and stress stress.  She presented to [facility] emergency department on 01/18/2021 for shortness of breath.  Her PCP found her hypoxic with pulse ox in the high 80s on room air and tachycardic in the office.  She was placed on 4 L nasal cannula and sent to the ER for evaluation.  She complained of acute on chronic DOE and BLE edema 3 days prior to admission.  She states she is always short of breath with exertion, symptoms much worse when she visited the doctor due to anxiety.  She stated that she has been taking her Lasix  40 mg daily but feels it does not do much for her shortness of breath and swelling.  She was drinking a lot of water at baseline and not consuming much salt.  She stopped smoking 45 years ago but she did smoke half a pack a day for 50 years.  She states her COPD is mild and uses albuterol  and Advair at home.  She denied any chest pain or pressure at that time.  She does not do much at baseline due to her chronic back pain and obesity.  She was given IV Lasix  and an echocardiogram was performed on 01/20/2021 which showed EF of 60 to 65%, mild LVH, grade 1 diastolic dysfunction, and mild dilatation of ascending aorta 41 mm.  I saw her December 2022, she is not feeling well.  She endorses shortness of breath and still says that she has some peripheral edema.  She states that since she left the hospital her Bumex  was  increased from 1 mg to 2 mg daily.  She has had 1 episode of hypotension while in the shower.  She got dizzy and lightheaded but did not pass out at this time.  Otherwise at home her blood pressures have been in the 120s systolic.  She said at the time of this episode in the shower she has a pulse ox at home and was able to put on her finger and it showed her oxygen  was 82%.  She states she did fall about a month ago but she did not hit her head.  She landed on her bottom and the bedside table fell and hit her in the mouth.  She states that she has a fluttering  feeling in her chest sometimes several times a day but it only lasts for a few seconds.  We discussed her lab work today.  Her lipid panel looked good.  Her HDL was 53, LDL 95, total cholesterol 791.  Her triglycerides were elevated at 362.  Her triglycerides have never been at goal.  She is currently taking fish oil but I have written a prescription for Vascepa  today as well as provided her some samples.  Her EKG shows sinus tachycardia today rate of 118.  She states that her heart rate is usually around 100.  Her Toprol  XL was recently increased to 50 mg and she states that she feels better on this dose.  Since she has been switched to the higher dose of Bumex  her weight continues to go down.  She has been watching her diet and has been maintaining a low-sodium diet with a fluid restriction of 1 L a day.  She also takes magnesium  supplementation at night.  She has had a difficult time with chronic back pain over the years and has had 4 back surgeries.  She is on chronic narcotic medication at home for back pain.  She states that her shortness of breath is not a new problem.  She was recently hospitalized and given IV Lasix .  She does see a pulmonologist for her COPD and she has an appointment in January for PFTs.  She does occasionally have some chest pain but this mainly occurs during the time of palpitations..  Prior Lexi scans 2017, 2022 were normal.  Echoes have shown normal LV function.  Had a Zio patch that showed 1 triggered events with sinus tachycardia.  Admitted 07/18/2022 for nausea vomiting.  Was orthostatic.  Started on midodrine  and seen by cardiology.  Persistent vomiting since then.  Has been on Ozempic .  Stopped taking it in February 2024.  Had recent outpatient GI evaluation and there was concern for gastroparesis with Ozempic .  Started on Reglan .  During that appointment she felt weak and orthostatics were positive.  Noted persistent nausea and vomiting.  I saw her 10/20, she presents with a  history of orthostatic hypotension with episodes of syncope. She is accompanied by her daughter, who is her primary caregiver.  She has experienced four to five episodes of syncope due to orthostatic hypotension, with no episodes since hospitalization except one during rehabilitation. Initially managed with midodrine , her blood pressure treatment was adjusted multiple times, leading to confusion with current medications, which include both midodrine  and amlodipine. Home health nurses and physical therapists assist with blood pressure monitoring. Her syncope episode do not have a prodrome and she has never had palpitations right before.  She has congestive heart failure and was previously on diuretics. Currently, she is on a seven-day course of  Lasix  (20 mg) for significant edema in her feet and ankles. Her weight has been increasing, but the edema has slightly improved with Lasix . She monitors her weight daily.  She experiences random palpitations and was monitored in the hospital with telemetry for five days, which showed no significant arrhythmias.  She has had sinus tachycardia for years and has never been on a beta-blocker.  She has an enlarged left ventricle and a small aortic aneurysm, last evaluated in March of the previous year.  Reports no shortness of breath nor dyspnea on exertion. Reports no chest pain, pressure, or tightness. No edema, orthopnea, PND.   Discussed the use of AI scribe software for clinical note transcription with the patient, who gave verbal consent to proceed.  Today, she ***  Past Medical History:  Diagnosis Date   AIN (anal intraepithelial neoplasia) anal canal    Anemia    with pregnancy   Anxiety    Arthritis    knees, lower back, knees   Chronic constipation    Chronic diastolic CHF (congestive heart failure) (HCC) 07/05/2019   CKD (chronic kidney disease)    Coarse tremors    Essential   COPD (chronic obstructive pulmonary disease) (HCC)    2017 chest  CT, history of   Degenerative lumbar spinal stenosis    Depression    Depression 07/05/2019   Drug dependency (HCC)    Elevated PTHrP level 05/09/2019   EtOH use 07/05/2019   Pt denies heavy drinking however   Gastroparesis    GERD (gastroesophageal reflux disease)    pt denies   Gout    Grade I diastolic dysfunction 02/01/2016   Noted ECHO   Hemorrhoids    History of adenomatous polyp of colon    History of basal cell carcinoma (BCC) excision 2015   nose   History of sepsis 05/14/2014   post lumbar surgery   History of shingles    x2   Hyperlipidemia    Hypertension    Hypothyroidism    Irregular heart beat    history of while undergoing radiation   Malignant neoplasm of upper-inner quadrant of left breast in female, estrogen receptor positive [facility]) oncologist-  dr layla--  per lov in epic no recurrence   dx 07-13-2015  Left breast invasive ductal carcinoma, Stage IA, Grade 1 (TXN0),  09-16-2015  s/p  left breast lumpectomy with sln bx's,  completed radiation 11-18-2015,  started antiestrogen therapy 12-14-2015   Neuropathy    Left foot and back of left leg   Obesity (BMI 30-39.9) 07/05/2019   Personal history of radiation therapy    completed 11-18-2015  left breast   Pneumonia    PONV (postoperative nausea and vomiting)    Prediabetes    diet controlled, no med   Restless leg syndrome    Seasonal allergies    Seizure (HCC) 05/2015   due to sepsis, only one time episode   Tremor    Wears partial dentures    lower     Past Surgical History:  Procedure Laterality Date   ABDOMINAL HYSTERECTOMY  1985   BASAL CELL CARCINOMA EXCISION  10/15   nose   BIOPSY  07/22/2022   Procedure: BIOPSY;  Surgeon: Aneita Gwendlyn DASEN, MD;  Location: [facility];  Service: Gastroenterology;;   BREAST BIOPSY Right 08/24/2015   BREAST BIOPSY Right 07/13/2015   BREAST BIOPSY Left 07/13/2015   BREAST EXCISIONAL BIOPSY Left 1972   BREAST LUMPECTOMY Left    BREAST LUMPECTOMY WITH  RADIOACTIVE SEED AND SENTINEL LYMPH NODE BIOPSY Left 09/16/2015   Procedure: BREAST LUMPECTOMY WITH RADIOACTIVE SEED AND SENTINEL LYMPH NODE BIOPSY;  Surgeon: Jina Nephew, MD;  Location: [facility];  Service: General;  Laterality: Left;   BREAST REDUCTION SURGERY Bilateral 1998   CATARACT EXTRACTION W/ INTRAOCULAR LENS IMPLANT Left 2016   CERVICAL SPINE SURGERY  1995   hallo   COLONOSCOPY  01/2013   polyps   ESOPHAGOGASTRODUODENOSCOPY N/A 07/22/2022   Procedure: ESOPHAGOGASTRODUODENOSCOPY (EGD);  Surgeon: Aneita Gwendlyn DASEN, MD;  Location: [facility];  Service: Gastroenterology;  Laterality: N/A;   EYE SURGERY Left 2016   HAND SURGERY Left 02-19-2002   dr murrell  @[facility]    repair collateral ligament/ MPJ left thumb   HARDWARE REMOVAL N/A 07/24/2019   Procedure: REVISION OF HARDWARE Lumbar two - Sacral one. Extention of Fusion to Lumbar one;  Surgeon: Lanis Pupa, MD;  Location: [facility];  Service: Neurosurgery;  Laterality: N/A;   HEMORRHOID SURGERY  2008   HIGH RESOLUTION ANOSCOPY N/A 02/28/2018   Procedure: HIGH RESOLUTION ANOSCOPY WITH BIOPSY;  Surgeon: Debby Hila, MD;  Location: [facility];  Service: General;  Laterality: N/A;   KNEE ARTHROSCOPY Right 2002   LUMBAR SPINE SURGERY  03-20-2015   dr lanis   fusion L4-5, L5-S1   MOUTH SURGERY  07/14/2018   2 infected teeth removed   MULTIPLE TOOTH EXTRACTIONS     with bone grafting lower bottom right and left   REDUCTION MAMMAPLASTY Bilateral    RIGHT COLECTOMY  09-10-2003   dr merrilyn @[facility]    multiple colon polyps (per path tubular adenoma's, hyperplastic , benign appendix, benign two lymph nodes   ROTATOR CUFF REPAIR Left 04/2016   TONSILLECTOMY  1969   TUBAL LIGATION Bilateral 1982    Current Medications: No outpatient medications have been marked as taking for the 03/21/24 encounter (Appointment) with Lucien Orren SAILOR, PA-C.     Allergies:   Ampicillin , Gabapentin , Penicillins, Percocet  [oxycodone -acetaminophen ], Crestor [rosuvastatin], Hydrocodone , and Other   Social History   Socioeconomic History   Marital status: Married    Spouse name: robert   Number of children: 3   Years of education: college   Highest education level: Not on file  Occupational History   Occupation: retired  Tobacco Use   Smoking status: Former    Current packs/day: 0.00    Average packs/day: 0.5 packs/day for 35.0 years (17.5 ttl pk-yrs)    Types: Cigarettes    Start date: 05/23/1983    Quit date: 05/22/2018    Years since quitting: 5.8   Smokeless tobacco: Never  Vaping Use   Vaping status: Never Used  Substance and Sexual Activity   Alcohol  use: Yes    Comment: 1 glass of wine a night   Drug use: Not Currently    Comment: CBD tincture   Sexual activity: Yes    Birth control/protection: Post-menopausal  Other Topics Concern   Not on file  Social History Narrative   Lives with husband in a 2 story home.  Has 2 living children.  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  Education: college.    Right-handed.   Five cups coffee per week.   Social Drivers of Corporate Investment Banker Strain: Low Risk  (05/02/2023)   Overall Financial Resource Strain (CARDIA)    Difficulty of Paying Living Expenses: Not hard at all  Food Insecurity: No Food Insecurity (12/13/2023)   Hunger Vital Sign  Worried About Programme Researcher, Broadcasting/film/video in the Last Year: Never true    Ran Out of Food in the Last Year: Never true  Transportation Needs: No Transportation Needs (12/13/2023)   PRAPARE - Administrator, Civil Service (Medical): No    Lack of Transportation (Non-Medical): No  Physical Activity: Inactive (05/02/2023)   Exercise Vital Sign    Days of Exercise per Week: 0 days    Minutes of Exercise per Session: 0 min  Stress: Stress Concern Present (05/02/2023)   [employer] of Occupational Health - Occupational Stress Questionnaire    Feeling of Stress : To some extent  Social  Connections: Socially Integrated (12/13/2023)   Social Connection and Isolation Panel    Frequency of Communication with Friends and Family: More than three times a week    Frequency of Social Gatherings with Friends and Family: More than three times a week    Attends Religious Services: 1 to 4 times per year    Active Member of [employer] or Organizations: Yes    Attends Banker Meetings: 1 to 4 times per year    Marital Status: Married     Family History: The patient's family history includes Atrial fibrillation in her mother; Brain cancer in her maternal aunt; Breast cancer in her maternal aunt; Lung cancer in her maternal aunt and maternal uncle; Prostate cancer in her maternal grandfather; Stomach cancer in her maternal grandmother; Stroke in her paternal grandmother; Tuberculosis in her paternal grandfather; Ulcers in her father. There is no history of Colon cancer, Esophageal cancer, or Rectal cancer.  ROS:   Please see the history of present illness.     EKGs/Labs/Other Studies Reviewed:    The following studies were reviewed today:  Zio monitor 06/2022 1 triggered event that was automatic for sinus tachycardia. No significant tachyarrhythmia or bradyarrhythmia. No atrial fibrillation or flutter.   Echocardiogram 06/27/2022  IMPRESSIONS     1. Left ventricular ejection fraction, by estimation, is 60 to 65%. The  left ventricle has normal function. The left ventricle has no regional  wall motion abnormalities. There is moderate left ventricular hypertrophy.  Left ventricular diastolic  parameters are consistent with Grade I diastolic dysfunction (impaired  relaxation).   2. Right ventricular systolic function is normal. The right ventricular  size is normal. There is normal pulmonary artery systolic pressure.   3. The mitral valve is normal in structure. No evidence of mitral valve  regurgitation. No evidence of mitral stenosis.   4. The aortic valve was not well  visualized. Aortic valve regurgitation  is not visualized. No aortic stenosis is present.   5. Aortic dilatation noted. There is borderline dilatation of the aortic  root, measuring 39 mm. There is mild dilatation of the ascending aorta,  measuring 40 mm.   6. The inferior vena cava is normal in size with <50% respiratory  variability, suggesting right atrial pressure of 8 mmHg.   Echocardiogram 01/20/2021 Impression: 1. Limited echo for LV function   2. Left ventricular ejection fraction, by estimation, is 60 to 65%. The  left ventricle has normal function. There is mild left ventricular  hypertrophy. Left ventricular diastolic parameters are consistent with  Grade I diastolic dysfunction (impaired  relaxation).   3. Aortic dilatation noted. There is mild dilatation of the ascending  aorta, measuring 41 mm.   4. The inferior vena cava is dilated in size with >50% respiratory  variability, suggesting right atrial pressure of 8 mmHg.  Comparison(s): Changes from prior study are noted. 07/07/2020: LVEF 65-70%.   FINDINGS   Left Ventricle: Left ventricular ejection fraction, by estimation, is 60  to 65%. The left ventricle has normal function. The left ventricular  internal cavity size was normal in size. There is mild left ventricular  hypertrophy. Left ventricular diastolic   parameters are consistent with Grade I diastolic dysfunction (impaired  relaxation). Indeterminate filling pressures.   Aorta: Aortic dilatation noted. There is mild dilatation of the ascending  aorta, measuring 41 mm.   Venous: The inferior vena cava is dilated in size with greater than 50%  respiratory variability, suggesting right atrial pressure of 8 mmHg.   EKG:  EKG is  ordered today.  The ekg ordered today demonstrates sinus tachycardia rate 118 bpm  Recent Labs: 12/21/2023: ALT 129; BUN 24; Creatinine, Ser 0.99; Hemoglobin 13.1; Magnesium  2.2; Platelets 158; Potassium 3.9; Sodium 146 02/27/2024: TSH  0.757  Recent Lipid Panel    Component Value Date/Time   CHOL 230 (H) 02/27/2024 1256   TRIG 113 02/27/2024 1256   HDL 70 02/27/2024 1256   CHOLHDL 3.3 02/27/2024 1256   CHOLHDL 4.2 07/08/2016 1042   VLDL 52 (H) 07/08/2016 1042   LDLCALC 140 (H) 02/27/2024 1256    Physical Exam:    VS:  There were no vitals taken for this visit.    Wt Readings from Last 3 Encounters:  02/27/24 176 lb (79.8 kg)  02/05/24 169 lb (76.7 kg)  01/17/24 161 lb 9.6 oz (73.3 kg)     GEN:  Well nourished, well developed in no acute distress HEENT: Normal NECK: No JVD; No carotid bruits LYMPHATICS: No lymphadenopathy CARDIAC: sinus tachycardia, no murmurs, rubs, gallops RESPIRATORY:  Clear to auscultation without rales, wheezing or rhonchi  ABDOMEN: Soft, non-tender, non-distended MUSCULOSKELETAL:  No pitting edema; No deformity  SKIN: Warm and dry NEUROLOGIC:  Alert and oriented x 3 PSYCHIATRIC:  Normal affect    PLAN:     Orthostatic hypotension with recurrent syncope Recurrent syncope due to orthostatic hypotension. Current management includes midodrine  and amlodipine, with stable blood pressure at 118/70 mmHg. Discussed managing medications and goal to wean off midodrine . - Administer midodrine  twice daily. - Administer amlodipine at night to minimize daytime hypotension. - Encourage use of compression stockings. -encourage proper hydration   Palpitations and tachycardia Persistent tachycardia with heart rate around 100 bpm, exacerbated by activity and pain. Previous telemetry monitoring did not reveal significant arrhythmias. Considering underlying rhythm issues such as SVT. - Apply zio heart monitor for 14 days. - Evaluate heart monitor results for potential arrhythmias. - We will see what her heart rate monitor shows but if it is sinus tach, it would be reasonable to start low-dose beta-blocker and potentially try her off the amlodipine  Chronic diastolic heart failure with lower  extremity edema Chronic diastolic heart failure with lower extremity edema. Currently on Lasix  20 mg for significant edema. Discussed potential need for extending Lasix  if edema persists. No current kidney issues. - Continue Lasix  20 mg for 7 days. - Extend Lasix  for an additional 7 days if edema persists (if this is needed would encourage labs, BMP) - Monitor daily weight. - Encourage use of compression stockings. -update an echo  Thoracic aortic aneurysm Thoracic aortic aneurysm previously measured at 40 mm. No recent imaging since March of last year. - Order echocardiogram to evaluate thoracic aortic aneurysm.  Multiple lumbar spinal fusions with compression fracture and adjacent segment disease Lumbar spinal fusions with current compression  fracture and adjacent segment disease. Candidate for a fifth surgery, but considering risks and recovery time, surgery is being avoided.  Thyroid  - Fluctuates between hyper and hypo-.  Last TSH was less than 0.100 - Working with PCP to regulate -this could of course be contributing to her HR  Medication Adjustments/Labs and Tests Ordered: Current medicines are reviewed at length with the patient today.  Concerns regarding medicines are outlined above.  No orders of the defined types were placed in this encounter.  No orders of the defined types were placed in this encounter.  Please follow-up in 6-8 weeks with me.   Signed, Orren LOISE Fabry, PA-C  03/19/2024 4:55 PM    [facility]

## 2024-03-21 ENCOUNTER — Telehealth: Payer: Self-pay | Admitting: Gastroenterology

## 2024-03-21 ENCOUNTER — Ambulatory Visit: Admitting: Physician Assistant

## 2024-03-21 ENCOUNTER — Encounter: Payer: Self-pay | Admitting: Gastroenterology

## 2024-03-21 DIAGNOSIS — R55 Syncope and collapse: Secondary | ICD-10-CM

## 2024-03-21 DIAGNOSIS — I5032 Chronic diastolic (congestive) heart failure: Secondary | ICD-10-CM

## 2024-03-21 DIAGNOSIS — E782 Mixed hyperlipidemia: Secondary | ICD-10-CM

## 2024-03-21 DIAGNOSIS — I10 Essential (primary) hypertension: Secondary | ICD-10-CM

## 2024-03-21 DIAGNOSIS — R002 Palpitations: Secondary | ICD-10-CM

## 2024-03-21 DIAGNOSIS — E079 Disorder of thyroid, unspecified: Secondary | ICD-10-CM

## 2024-03-21 DIAGNOSIS — R000 Tachycardia, unspecified: Secondary | ICD-10-CM

## 2024-03-21 DIAGNOSIS — E0590 Thyrotoxicosis, unspecified without thyrotoxic crisis or storm: Secondary | ICD-10-CM

## 2024-03-21 NOTE — Telephone Encounter (Signed)
 Patient called stating she was supposed to come in this week to get labs done and she will have to come next week because she is sick. Patient has also been scheduled for flex sig on 1/19 at 120:00 with PV on 1/5 at 2:00

## 2024-03-21 NOTE — Telephone Encounter (Signed)
 noted

## 2024-03-22 ENCOUNTER — Telehealth: Payer: Self-pay

## 2024-03-22 NOTE — Telephone Encounter (Signed)
 Copied from CRM #[id number]. Topic: Clinical - Home Health Verbal Orders >> Mar 22, 2024  3:42 PM Antwanette L wrote: Caller/Agency: aimee/ [facility] Callback Number: [phone] Service Requested: Skilled Nursing Frequency: 1w5  Any new concerns about the patient? No.

## 2024-03-25 NOTE — Telephone Encounter (Signed)
Ianna notified.

## 2024-04-03 ENCOUNTER — Other Ambulatory Visit: Payer: Self-pay | Admitting: Medical

## 2024-04-03 ENCOUNTER — Telehealth: Payer: Self-pay | Admitting: Physical Medicine and Rehabilitation

## 2024-04-03 NOTE — Telephone Encounter (Signed)
 Pt needs meds refill Lyrica  and amitrittyrine.  She said Dr. Leigh has increase the dosage to 50 mg

## 2024-04-03 NOTE — Telephone Encounter (Signed)
 Pt called and stated that she changed pharmacy, and the new pharmacy is [facility] at [address], [place].

## 2024-04-04 ENCOUNTER — Other Ambulatory Visit: Payer: Self-pay | Admitting: Physical Medicine and Rehabilitation

## 2024-04-04 MED ORDER — AMITRIPTYLINE HCL 25 MG PO TABS: 50.0000 mg | ORAL_TABLET | Freq: Every day | ORAL | 1 refills | Status: AC

## 2024-04-04 MED ORDER — PREGABALIN 50 MG PO CAPS: 50.0000 mg | ORAL_CAPSULE | Freq: Three times a day (TID) | ORAL | 1 refills | Status: AC

## 2024-04-09 ENCOUNTER — Other Ambulatory Visit: Payer: Self-pay | Admitting: Physical Medicine and Rehabilitation

## 2024-04-22 ENCOUNTER — Ambulatory Visit

## 2024-04-22 VITALS — Ht 67.0 in | Wt 180.0 lb

## 2024-04-22 DIAGNOSIS — Z860100 Personal history of colon polyps, unspecified: Secondary | ICD-10-CM

## 2024-04-22 NOTE — Progress Notes (Signed)
 No egg or soy allergy known to patient  No issues known to pt with past sedation with any surgeries or procedures Patient denies ever being told they had issues or difficulty with intubation  No FH of Malignant Hyperthermia Pt is not on diet pills (Will wait and start Trulicity  after the procedure) Pt is not on  home 02  Pt is not on blood thinners  Pt denies issues with constipation  No A fib or A flutter - Hx of PVC's Have any cardiac testing pending--NO  Pt can ambulate with walker assist, states she will be able to transfer herself from chair to stretcher and back Pt denies use of chewing tobacco Discussed diabetic I weight loss medication holds Discussed NSAID holds Checked BMI Pt instructed to use Singlecare.com or GoodRx for a price reduction on prep  Patient's chart reviewed by Norleen Schillings CNRA prior to previsit and patient appropriate for the [facility].  Pre visit completed and red dot placed by patient's name on their procedure day (on provider's schedule).

## 2024-04-29 ENCOUNTER — Encounter: Payer: Self-pay | Admitting: Gastroenterology

## 2024-05-02 ENCOUNTER — Other Ambulatory Visit: Payer: Self-pay | Admitting: Medical

## 2024-05-03 ENCOUNTER — Encounter: Admitting: Physical Medicine and Rehabilitation

## 2024-05-06 ENCOUNTER — Other Ambulatory Visit: Admitting: Gastroenterology

## 2024-05-07 ENCOUNTER — Ambulatory Visit: Payer: Medicare HMO | Admitting: *Deleted

## 2024-05-07 ENCOUNTER — Other Ambulatory Visit

## 2024-05-07 VITALS — Ht 68.0 in | Wt 182.0 lb

## 2024-05-07 DIAGNOSIS — Z0000 Encounter for general adult medical examination without abnormal findings: Secondary | ICD-10-CM

## 2024-05-07 DIAGNOSIS — Z780 Asymptomatic menopausal state: Secondary | ICD-10-CM | POA: Diagnosis not present

## 2024-05-07 DIAGNOSIS — R748 Abnormal levels of other serum enzymes: Secondary | ICD-10-CM

## 2024-05-07 LAB — HEPATIC FUNCTION PANEL
ALT: 10 U/L (ref 3–35)
AST: 17 U/L (ref 5–37)
Albumin: 4.4 g/dL (ref 3.5–5.2)
Alkaline Phosphatase: 85 U/L (ref 39–117)
Bilirubin, Direct: 0.1 mg/dL (ref 0.1–0.3)
Total Bilirubin: 0.5 mg/dL (ref 0.2–1.2)
Total Protein: 6.8 g/dL (ref 6.0–8.3)

## 2024-05-07 NOTE — Patient Instructions (Signed)
 [name],  Thank you for taking the time for your Medicare Wellness Visit. I appreciate your continued commitment to your health goals. Please review the care plan we discussed, and feel free to reach out if I can assist you further.  Please note that Annual Wellness Visits do not include a physical exam. Some assessments may be limited, especially if the visit was conducted virtually. If needed, we may recommend an in-person follow-up with your provider.  Ongoing Care Seeing your primary care provider every 3 to 6 months helps us  monitor your health and provide consistent, personalized care.   Referrals If a referral was made during today's visit and you haven't received any updates within two weeks, please contact the referred provider directly to check on the status.  Recommended Screenings:  Health Maintenance  Topic Date Due   COVID-19 Vaccine (3 - Pfizer risk series) 11/19/2019   Complete foot exam   04/08/2023   DTaP/Tdap/Td vaccine (2 - Td or Tdap) 07/03/2023   Kidney health urinalysis for diabetes  11/03/2023   Breast Cancer Screening  01/27/2024   Eye exam for diabetics  01/27/2024   Flu Shot  07/16/2024*   Hemoglobin A1C  06/14/2024   Yearly kidney function blood test for diabetes  01/01/2025   Medicare Annual Wellness Visit  05/07/2025   Pneumococcal Vaccine for age over 42  Completed   Osteoporosis screening with Bone Density Scan  Completed   Hepatitis C Screening  Completed   Zoster (Shingles) Vaccine  Completed   Meningitis B Vaccine  Aged Out   Colon Cancer Screening  Discontinued  *Topic was postponed. The date shown is not the original due date.       05/07/2024    3:30 PM  Advanced Directives  Does Patient Have a Medical Advance Directive? No  Would patient like information on creating a medical advance directive? No - Patient declined    Vision: Annual vision screenings are recommended for early detection of glaucoma, cataracts, and diabetic  retinopathy. These exams can also reveal signs of chronic conditions such as diabetes and high blood pressure.  Dental: Annual dental screenings help detect early signs of oral cancer, gum disease, and other conditions linked to overall health, including heart disease and diabetes.  Please see the attached documents for additional preventive care recommendations.    [name] , Thank you for taking time to come for your Medicare Wellness Visit. I appreciate your ongoing commitment to your health goals. Please review the following plan we discussed and let me know if I can assist you in the future.   Screening recommendations/referrals: Colonoscopy:  Mammogram:  Bone Density:  Recommended yearly ophthalmology/optometry visit for glaucoma screening and checkup Recommended yearly dental visit for hygiene and checkup  Vaccinations: Influenza vaccine:  Pneumococcal vaccine:  Tdap vaccine:  Shingles vaccine:        Preventive Care 65 Years and Older, Female Preventive care refers to lifestyle choices and visits with your health care provider that can promote health and wellness. What does preventive care include? A yearly physical exam. This is also called an annual well check. Dental exams once or twice a year. Routine eye exams. Ask your health care provider how often you should have your eyes checked. Personal lifestyle choices, including: Daily care of your teeth and gums. Regular physical activity. Eating a healthy diet. Avoiding tobacco and drug use. Limiting alcohol  use. Practicing safe sex. Taking low-dose aspirin every day. Taking vitamin and mineral supplements as recommended by  your health care provider. What happens during an annual well check? The services and screenings done by your health care provider during your annual well check will depend on your age, overall health, lifestyle risk factors, and family history of disease. Counseling  Your health care provider  may ask you questions about your: Alcohol  use. Tobacco use. Drug use. Emotional well-being. Home and relationship well-being. Sexual activity. Eating habits. History of falls. Memory and ability to understand (cognition). Work and work astronomer. Reproductive health. Screening  You may have the following tests or measurements: Height, weight, and BMI. Blood pressure. Lipid and cholesterol levels. These may be checked every 5 years, or more frequently if you are over 26 years old. Skin check. Lung cancer screening. You may have this screening every year starting at age 41 if you have a 30-pack-year history of smoking and currently smoke or have quit within the past 15 years. Fecal occult blood test (FOBT) of the stool. You may have this test every year starting at age 75. Flexible sigmoidoscopy or colonoscopy. You may have a sigmoidoscopy every 5 years or a colonoscopy every 10 years starting at age 56. Hepatitis C blood test. Hepatitis B blood test. Sexually transmitted disease (STD) testing. Diabetes screening. This is done by checking your blood sugar (glucose) after you have not eaten for a while (fasting). You may have this done every 1-3 years. Bone density scan. This is done to screen for osteoporosis. You may have this done starting at age 76. Mammogram. This may be done every 1-2 years. Talk to your health care provider about how often you should have regular mammograms. Talk with your health care provider about your test results, treatment options, and if necessary, the need for more tests. Vaccines  Your health care provider may recommend certain vaccines, such as: Influenza vaccine. This is recommended every year. Tetanus, diphtheria, and acellular pertussis (Tdap, Td) vaccine. You may need a Td booster every 10 years. Zoster vaccine. You may need this after age 46. Pneumococcal 13-valent conjugate (PCV13) vaccine. One dose is recommended after age 37. Pneumococcal  polysaccharide (PPSV23) vaccine. One dose is recommended after age 5. Talk to your health care provider about which screenings and vaccines you need and how often you need them. This information is not intended to replace advice given to you by your health care provider. Make sure you discuss any questions you have with your health care provider. Document Released: 05/01/2015 Document Revised: 12/23/2015 Document Reviewed: 02/03/2015 Elsevier Interactive Patient Education  2017 [employer].  Fall Prevention in the Home Falls can cause injuries. They can happen to people of all ages. There are many things you can do to make your home safe and to help prevent falls. What can I do on the outside of my home? Regularly fix the edges of walkways and driveways and fix any cracks. Remove anything that might make you trip as you walk through a door, such as a raised step or threshold. Trim any bushes or trees on the path to your home. Use bright outdoor lighting. Clear any walking paths of anything that might make someone trip, such as rocks or tools. Regularly check to see if handrails are loose or broken. Make sure that both sides of any steps have handrails. Any raised decks and porches should have guardrails on the edges. Have any leaves, snow, or ice cleared regularly. Use sand or salt on walking paths during winter. Clean up any spills in your garage right away. This  includes oil or grease spills. What can I do in the bathroom? Use night lights. Install grab bars by the toilet and in the tub and shower. Do not use towel bars as grab bars. Use non-skid mats or decals in the tub or shower. If you need to sit down in the shower, use a plastic, non-slip stool. Keep the floor dry. Clean up any water that spills on the floor as soon as it happens. Remove soap buildup in the tub or shower regularly. Attach bath mats securely with double-sided non-slip rug tape. Do not have throw rugs and other  things on the floor that can make you trip. What can I do in the bedroom? Use night lights. Make sure that you have a light by your bed that is easy to reach. Do not use any sheets or blankets that are too big for your bed. They should not hang down onto the floor. Have a firm chair that has side arms. You can use this for support while you get dressed. Do not have throw rugs and other things on the floor that can make you trip. What can I do in the kitchen? Clean up any spills right away. Avoid walking on wet floors. Keep items that you use a lot in easy-to-reach places. If you need to reach something above you, use a strong step stool that has a grab bar. Keep electrical cords out of the way. Do not use floor polish or wax that makes floors slippery. If you must use wax, use non-skid floor wax. Do not have throw rugs and other things on the floor that can make you trip. What can I do with my stairs? Do not leave any items on the stairs. Make sure that there are handrails on both sides of the stairs and use them. Fix handrails that are broken or loose. Make sure that handrails are as long as the stairways. Check any carpeting to make sure that it is firmly attached to the stairs. Fix any carpet that is loose or worn. Avoid having throw rugs at the top or bottom of the stairs. If you do have throw rugs, attach them to the floor with carpet tape. Make sure that you have a light switch at the top of the stairs and the bottom of the stairs. If you do not have them, ask someone to add them for you. What else can I do to help prevent falls? Wear shoes that: Do not have high heels. Have rubber bottoms. Are comfortable and fit you well. Are closed at the toe. Do not wear sandals. If you use a stepladder: Make sure that it is fully opened. Do not climb a closed stepladder. Make sure that both sides of the stepladder are locked into place. Ask someone to hold it for you, if possible. Clearly  mark and make sure that you can see: Any grab bars or handrails. First and last steps. Where the edge of each step is. Use tools that help you move around (mobility aids) if they are needed. These include: Canes. Walkers. Scooters. Crutches. Turn on the lights when you go into a dark area. Replace any light bulbs as soon as they burn out. Set up your furniture so you have a clear path. Avoid moving your furniture around. If any of your floors are uneven, fix them. If there are any pets around you, be aware of where they are. Review your medicines with your doctor. Some medicines can make you feel dizzy.  This can increase your chance of falling. Ask your doctor what other things that you can do to help prevent falls. This information is not intended to replace advice given to you by your health care provider. Make sure you discuss any questions you have with your health care provider. Document Released: 01/29/2009 Document Revised: 09/10/2015 Document Reviewed: 05/09/2014 Elsevier Interactive Patient Education  2017 [employer].

## 2024-05-07 NOTE — Progress Notes (Signed)
 "  Chief Complaint  Patient presents with   Medicare Wellness     Subjective:   Brandi Stephens is a 79 y.o. female who presents for a Medicare Annual Wellness Visit.  No voiced or noted concerns at this time   Visit info / Clinical Intake: Medicare Wellness Visit Type:: Subsequent Annual Wellness Visit Persons participating in visit and providing information:: patient Medicare Wellness Visit Mode:: Video Since this visit was completed virtually, some vitals may be partially provided or unavailable. Missing vitals are due to the limitations of the virtual format.: Unable to obtain vitals - no equipment If Telephone or Video please confirm:: I connected with patient using audio/video enable telemedicine. I verified patient identity with two identifiers, discussed telehealth limitations, and patient agreed to proceed. Patient Location:: home Provider Location:: home Interpreter Needed?: No Pre-visit prep was completed: no AWV questionnaire completed by patient prior to visit?: no Living arrangements:: lives with spouse/significant other Patient's Overall Health Status Rating: (!) fair Typical amount of pain: (!) a lot Does pain affect daily life?: no  Dietary Habits and Nutritional Risks How many meals a day?: 2 Eats fruit and vegetables daily?: yes Most meals are obtained by: preparing own meals In the last 2 weeks, have you had any of the following?: none Diabetic:: no  Functional Status Activities of Daily Living (to include ambulation/medication): Independent Ambulation: Independent with device- listed below Home Assistive Devices/Equipment: Walker (specify Type) Medication Administration: Independent Home Management (perform basic housework or laundry): Needs assistance (comment) Manage your own finances?: yes Primary transportation is: family / friends Concerns about vision?: no *vision screening is required for WTM* Concerns about hearing?: no  Fall Screening Falls  in the past year?: 1 Number of falls in past year: 1 Was there an injury with Fall?: 1 Fall Risk Category Calculator: 3 Patient Fall Risk Level: High Fall Risk  Fall Risk Patient at Risk for Falls Due to: Impaired mobility; Impaired balance/gait Fall risk Follow up: Falls evaluation completed; Education provided; Falls prevention discussed  Home and Transportation Safety: All rugs have non-skid backing?: N/A, no rugs All stairs or steps have railings?: N/A, no stairs Grab bars in the bathtub or shower?: yes Have non-skid surface in bathtub or shower?: yes Good home lighting?: yes Regular seat belt use?: yes Hospital stays in the last year:: (!) yes How many hospital stays:: 3  Cognitive Assessment Difficulty concentrating, remembering, or making decisions? : no Will 6CIT or Mini Cog be Completed: yes What year is it?: 0 points What month is it?: 0 points Give patient an address phrase to remember (5 components): its very sunny outside today in January About what time is it?: 0 points Count backwards from 20 to 1: 0 points Say the months of the year in reverse: 0 points Repeat the address phrase from earlier: 2 points 6 CIT Score: 2 points  Advance Directives (For Healthcare) Does Patient Have a Medical Advance Directive?: No Does patient want to make changes to medical advance directive?: No - Patient declined Type of Advance Directive: Healthcare Power of [place]; Living will Would patient like information on creating a medical advance directive?: No - Patient declined  Reviewed/Updated  Reviewed/Updated: Reviewed All (Medical, Surgical, Family, Medications, Allergies, Care Teams, Patient Goals); Surgical History; Family History; Medications; Allergies; Care Teams; Patient Goals; Medical History    Allergies (verified) Ampicillin , Gabapentin , Penicillins, Percocet [oxycodone -acetaminophen ], Crestor [rosuvastatin], Hydrocodone , Hydromorphone , and Other   Current  Medications (verified) Outpatient Encounter Medications as of 05/07/2024  Medication Sig  allopurinol  (ZYLOPRIM ) 100 MG tablet TAKE 2 TABLETS BY MOUTH EVERY DAY   amitriptyline  (ELAVIL ) 25 MG tablet Take 2 tablets (50 mg total) by mouth at bedtime.   Biotin  3 MG TABS Take 3 mg by mouth daily.   cefadroxil  (DURICEF) 500 MG capsule Take 1 capsule (500 mg total) by mouth 2 (two) times daily.   Cholecalciferol  (VITAMIN D3) 50 MCG (2000 UT) capsule Take 1 capsule (2,000 Units total) by mouth daily.   colchicine  0.6 MG tablet TAKE 2 TABLETS BY MOUTH ONCE, THEN 1 TABLET AN HOUR LATER, THEN 1 TABLET TWICE DAILY FOR 3 DAYS   cyanocobalamin  1000 MCG tablet Take 1,000 mcg by mouth daily.   Dulaglutide  (TRULICITY ) 0.75 MG/0.5ML SOAJ Inject 0.75 mg into the skin once a week.   furosemide  (LASIX ) 20 MG tablet Take 1 tablet (20 mg total) by mouth daily as needed for fluid.   levothyroxine  (SYNTHROID ) 75 MCG tablet Take 1 tablet (75 mcg total) by mouth daily before breakfast.   lidocaine  (HM LIDOCAINE  PATCH) 4 % Place 1 patch onto the skin daily.   meloxicam  (MOBIC ) 7.5 MG tablet Take 1 tablet (7.5 mg total) by mouth daily.   methocarbamol  (ROBAXIN ) 500 MG tablet Take 1 tablet (500 mg total) by mouth 3 (three) times daily as needed for muscle spasms.   metoprolol  tartrate (LOPRESSOR ) 25 MG tablet Take 0.5 tablets (12.5 mg total) by mouth 2 (two) times daily.   midodrine  (PROAMATINE ) 2.5 MG tablet Take 1 tablet (2.5 mg total) by mouth 2 (two) times daily with a meal.   midodrine  (PROAMATINE ) 5 MG tablet TAKE 1 TABLET (5 MG TOTAL) BY MOUTH 2 (TWO) TIMES DAILY WITH A MEAL.   ondansetron  (ZOFRAN ) 4 MG tablet Take 1 tablet (4 mg total) by mouth every 6 (six) hours as needed for nausea.   pantoprazole  (PROTONIX ) 40 MG tablet Take 40 mg by mouth 2 (two) times daily.   polyethylene glycol powder (GLYCOLAX /MIRALAX ) 17 GM/SCOOP powder Take 17 g by mouth daily as needed for moderate constipation.   pregabalin  (LYRICA ) 50  MG capsule Take 1 capsule (50 mg total) by mouth 3 (three) times daily.   Propylene Glycol (SYSTANE BALANCE OP) Place 1 drop into both eyes 4 (four) times daily as needed (dry eyes).   senna-docusate (SENOKOT-S) 8.6-50 MG tablet Take 1 tablet by mouth at bedtime as needed for mild constipation.   No facility-administered encounter medications on file as of 05/07/2024.    History: Past Medical History:  Diagnosis Date   AIN (anal intraepithelial neoplasia) anal canal    Anemia    with pregnancy   Anxiety    Arthritis    knees, lower back, knees   Chronic constipation    Chronic diastolic CHF (congestive heart failure) (HCC) 07/05/2019   CKD (chronic kidney disease)    Coarse tremors    Essential   COPD (chronic obstructive pulmonary disease) (HCC)    2017 chest CT, history of   Degenerative lumbar spinal stenosis    Depression    Depression 07/05/2019   Drug dependency (HCC)    Elevated PTHrP level 05/09/2019   EtOH use 07/05/2019   Pt denies heavy drinking however   Gastroparesis    GERD (gastroesophageal reflux disease)    pt denies   Gout    Grade I diastolic dysfunction 02/01/2016   Noted ECHO   Hemorrhoids    History of adenomatous polyp of colon    History of basal cell carcinoma (BCC) excision 2015  nose   History of sepsis 05/14/2014   post lumbar surgery   History of shingles    x2   Hyperlipidemia    Hypertension    Hypothyroidism    Irregular heart beat    history of while undergoing radiation   Malignant neoplasm of upper-inner quadrant of left breast in female, estrogen receptor positive [facility]) oncologist-  dr layla--  per lov in epic no recurrence   dx 07-13-2015  Left breast invasive ductal carcinoma, Stage IA, Grade 1 (TXN0),  09-16-2015  s/p  left breast lumpectomy with sln bx's,  completed radiation 11-18-2015,  started antiestrogen therapy 12-14-2015   Neuropathy    Left foot and back of left leg   Obesity (BMI 30-39.9) 07/05/2019   Personal  history of radiation therapy    completed 11-18-2015  left breast   Pneumonia    PONV (postoperative nausea and vomiting)    Prediabetes    diet controlled, no med   Restless leg syndrome    Seasonal allergies    Seizure (HCC) 05/2015   due to sepsis, only one time episode   Tremor    Wears partial dentures    lower    Past Surgical History:  Procedure Laterality Date   ABDOMINAL HYSTERECTOMY  1985   BASAL CELL CARCINOMA EXCISION  10/15   nose   BIOPSY  07/22/2022   Procedure: BIOPSY;  Surgeon: Aneita Gwendlyn DASEN, MD;  Location: [facility];  Service: Gastroenterology;;   BREAST BIOPSY Right 08/24/2015   BREAST BIOPSY Right 07/13/2015   BREAST BIOPSY Left 07/13/2015   BREAST EXCISIONAL BIOPSY Left 1972   BREAST LUMPECTOMY Left    BREAST LUMPECTOMY WITH RADIOACTIVE SEED AND SENTINEL LYMPH NODE BIOPSY Left 09/16/2015   Procedure: BREAST LUMPECTOMY WITH RADIOACTIVE SEED AND SENTINEL LYMPH NODE BIOPSY;  Surgeon: Jina Nephew, MD;  Location: [facility];  Service: General;  Laterality: Left;   BREAST REDUCTION SURGERY Bilateral 1998   CATARACT EXTRACTION W/ INTRAOCULAR LENS IMPLANT Left 2016   CERVICAL SPINE SURGERY  1995   hallo   COLONOSCOPY  01/2013   polyps   ESOPHAGOGASTRODUODENOSCOPY N/A 07/22/2022   Procedure: ESOPHAGOGASTRODUODENOSCOPY (EGD);  Surgeon: Aneita Gwendlyn DASEN, MD;  Location: [facility];  Service: Gastroenterology;  Laterality: N/A;   EYE SURGERY Left 2016   HAND SURGERY Left 02-19-2002   dr murrell  @[facility]    repair collateral ligament/ MPJ left thumb   HARDWARE REMOVAL N/A 07/24/2019   Procedure: REVISION OF HARDWARE Lumbar two - Sacral one. Extention of Fusion to Lumbar one;  Surgeon: Lanis Pupa, MD;  Location: [facility];  Service: Neurosurgery;  Laterality: N/A;   HEMORRHOID SURGERY  2008   HIGH RESOLUTION ANOSCOPY N/A 02/28/2018   Procedure: HIGH RESOLUTION ANOSCOPY WITH BIOPSY;  Surgeon: Debby Hila, MD;  Location: [facility];   Service: General;  Laterality: N/A;   KNEE ARTHROSCOPY Right 2002   LUMBAR SPINE SURGERY  03-20-2015   dr lanis   fusion L4-5, L5-S1   MOUTH SURGERY  07/14/2018   2 infected teeth removed   MULTIPLE TOOTH EXTRACTIONS     with bone grafting lower bottom right and left   REDUCTION MAMMAPLASTY Bilateral    RIGHT COLECTOMY  09-10-2003   dr merrilyn @[facility]    multiple colon polyps (per path tubular adenoma's, hyperplastic , benign appendix, benign two lymph nodes   ROTATOR CUFF REPAIR Left 04/2016   TONSILLECTOMY  1969   TUBAL LIGATION Bilateral 1982   Family History  Problem  Relation Age of Onset   Atrial fibrillation Mother    Ulcers Father    Breast cancer Maternal Aunt    Lung cancer Maternal Uncle    Stomach cancer Maternal Grandmother    Lung cancer Maternal Aunt    Brain cancer Maternal Aunt    Prostate cancer Maternal Grandfather    Stroke Paternal Grandmother    Tuberculosis Paternal Grandfather    Colon cancer Neg Hx    Esophageal cancer Neg Hx    Rectal cancer Neg Hx    Social History   Occupational History   Occupation: retired  Tobacco Use   Smoking status: Former    Current packs/day: 0.00    Average packs/day: 0.5 packs/day for 35.0 years (17.5 ttl pk-yrs)    Types: Cigarettes    Start date: 05/23/1983    Quit date: 05/22/2018    Years since quitting: 5.9   Smokeless tobacco: Never  Vaping Use   Vaping status: Never Used  Substance and Sexual Activity   Alcohol  use: Yes    Comment: 1 glass of wine a night   Drug use: Not Currently    Comment: CBD tincture   Sexual activity: Yes    Birth control/protection: Post-menopausal   Tobacco Counseling Counseling given: Not Answered  SDOH Screenings   Food Insecurity: No Food Insecurity (05/07/2024)  Housing: Low Risk (05/07/2024)  Transportation Needs: No Transportation Needs (05/07/2024)  Utilities: Not At Risk (05/07/2024)  Alcohol  Screen: Low Risk (05/02/2023)  Depression (PHQ2-9): Low Risk (05/07/2024)   Financial Resource Strain: Low Risk (05/02/2023)  Physical Activity: Inactive (05/07/2024)  Social Connections: Socially Integrated (05/07/2024)  Stress: Stress Concern Present (05/07/2024)  Tobacco Use: Medium Risk (05/07/2024)  Health Literacy: Adequate Health Literacy (05/07/2024)   See flowsheets for full screening details  Depression Screen PHQ 2 & 9 Depression Scale- Over the past 2 weeks, how often have you been bothered by any of the following problems? Little interest or pleasure in doing things: 0 Feeling down, depressed, or hopeless (PHQ Adolescent also includes...irritable): 0 PHQ-2 Total Score: 0 Trouble falling or staying asleep, or sleeping too much: 2 Feeling tired or having little energy: 2 Poor appetite or overeating (PHQ Adolescent also includes...weight loss): 0 Feeling bad about yourself - or that you are a failure or have let yourself or your family down: 0 Trouble concentrating on things, such as reading the newspaper or watching television (PHQ Adolescent also includes...like school work): 0 Moving or speaking so slowly that other people could have noticed. Or the opposite - being so fidgety or restless that you have been moving around a lot more than usual: 0 Thoughts that you would be better off dead, or of hurting yourself in some way: 0 PHQ-9 Total Score: 4     Goals Addressed             This Visit's Progress    Patient Stated       Get stonger             Objective:    Today's Vitals   05/07/24 1528  Weight: 182 lb (82.6 kg)  Height: 5' 8 (1.727 m)   Body mass index is 27.67 kg/m.  Hearing/Vision screen Hearing Screening - Comments:: No trouble hearing Vision Screening - Comments:: [place] Up to date Immunizations and Health Maintenance Health Maintenance  Topic Date Due   COVID-19 Vaccine (3 - Pfizer risk series) 11/19/2019   FOOT EXAM  04/08/2023   DTaP/Tdap/Td (2 - Td or Tdap) 07/03/2023  Diabetic kidney evaluation - Urine ACR   11/03/2023   Mammogram  01/27/2024   OPHTHALMOLOGY EXAM  01/27/2024   Influenza Vaccine  07/16/2024 (Originally 11/17/2023)   HEMOGLOBIN A1C  06/14/2024   Diabetic kidney evaluation - eGFR measurement  01/01/2025   Medicare Annual Wellness (AWV)  05/07/2025   Pneumococcal Vaccine: 50+ Years  Completed   Bone Density Scan  Completed   Hepatitis C Screening  Completed   Zoster Vaccines- Shingrix   Completed   Meningococcal B Vaccine  Aged Out   Colonoscopy  Discontinued        Assessment/Plan:  This is a routine wellness examination for Brandi Stephens.  Patient Care Team: Tysinger, Alm RAMAN, PA-C as PCP - General (Family Medicine) Aron Shoulders, MD as Consulting Physician (General Surgery) Joyce Norleen BROCKS, MD as Consulting Physician (Family Medicine) Jadine Charleston, MD as Consulting Physician (Dermatology) Crawford, Morna Pickle, NP as Nurse Practitioner (Hematology and Oncology) Josefina Chew, MD as Consulting Physician (Orthopedic Surgery) Armbruster, Elspeth SQUIBB, MD as Consulting Physician (Gastroenterology) Lanis Pupa, MD as Consulting Physician (Neurosurgery) Onita Duos, MD as Consulting Physician (Neurology) Liane Sharyne MATSU, [facility] (Inactive) as Pharmacist (Pharmacist) Neda Jennet LABOR, MD as Consulting Physician (Pulmonary Disease) Neda Jennet LABOR, MD as Consulting Physician (Pulmonary Disease) Caleen Dirks, MD as Consulting Physician (Internal Medicine) Floretta Mallard, MD as Consulting Physician (Cardiology)  I have personally reviewed and noted the following in the patients chart:   Medical and social history Use of alcohol , tobacco or illicit drugs  Current medications and supplements including opioid prescriptions. Functional ability and status Nutritional status Physical activity Advanced directives List of other physicians Hospitalizations, surgeries, and ER visits in previous 12 months Vitals Screenings to include cognitive, depression, and  falls Referrals and appointments  Orders Placed This Encounter  Procedures   DG Bone Density    Reason for Exam (SYMPTOM  OR DIAGNOSIS REQUIRED):   screening osteoporosis    Preferred imaging location?:   [facility]   In addition, I have reviewed and discussed with patient certain preventive protocols, quality metrics, and best practice recommendations. A written personalized care plan for preventive services as well as general preventive health recommendations were provided to patient.   Mliss Graff, LPN   8/79/7973   Return in 1 year (on 05/07/2025).  After Visit Summary: (MyChart) Due to this being a telephonic visit, the after visit summary with patients personalized plan was offered to patient via MyChart   Nurse Notes:  "

## 2024-05-08 ENCOUNTER — Ambulatory Visit: Admitting: Gastroenterology

## 2024-05-08 ENCOUNTER — Ambulatory Visit: Payer: Self-pay | Admitting: Gastroenterology

## 2024-05-08 ENCOUNTER — Encounter: Payer: Self-pay | Admitting: Gastroenterology

## 2024-05-08 ENCOUNTER — Encounter: Admitting: Physical Medicine and Rehabilitation

## 2024-05-08 VITALS — BP 125/57 | HR 66 | Temp 97.2°F | Resp 14 | Ht 67.0 in | Wt 180.0 lb

## 2024-05-08 DIAGNOSIS — R194 Change in bowel habit: Secondary | ICD-10-CM

## 2024-05-08 DIAGNOSIS — K6289 Other specified diseases of anus and rectum: Secondary | ICD-10-CM | POA: Diagnosis not present

## 2024-05-08 DIAGNOSIS — Z1211 Encounter for screening for malignant neoplasm of colon: Secondary | ICD-10-CM

## 2024-05-08 DIAGNOSIS — K649 Unspecified hemorrhoids: Secondary | ICD-10-CM

## 2024-05-08 DIAGNOSIS — Z860100 Personal history of colon polyps, unspecified: Secondary | ICD-10-CM | POA: Diagnosis not present

## 2024-05-08 MED ORDER — FLEET ENEMA RE ENEM
1.0000 | ENEMA | Freq: Once | RECTAL | Status: AC
  Administered 2024-05-08: 1 via RECTAL

## 2024-05-08 MED ORDER — SODIUM CHLORIDE 0.9 % IV SOLN: 500.0000 mL | Freq: Once | INTRAVENOUS | Status: DC

## 2024-05-08 NOTE — Op Note (Signed)
 [facility] Patient Name: Brandi Stephens Procedure Date: 05/08/2024 10:41 AM MRN: [id number] Endoscopist: Elspeth P. Leigh , MD, [id number] Age: 79 Referring MD:  Date of Birth: 03-22-1946 Gender: Female Account #: [id number] Procedure:                Flexible Sigmoidoscopy Indications:              High risk colon cancer surveillance: Personal                            history of colonic polyps - last colonoscopy                            01/2023 - could not clear the rectum / rectosigmoid                            colon due to retained stool. Flex sig to further                            evaluate. HIstory of AIN Medicines:                Monitored Anesthesia Care Procedure:                Pre-Anesthesia Assessment:                           - Prior to the procedure, a History and Physical                            was performed, and patient medications and                            allergies were reviewed. The patient's tolerance of                            previous anesthesia was also reviewed. The risks                            and benefits of the procedure and the sedation                            options and risks were discussed with the patient.                            All questions were answered, and informed consent                            was obtained. Prior Anticoagulants: The patient has                            taken no anticoagulant or antiplatelet agents. ASA                            Grade Assessment: III - A patient with severe  systemic disease. After reviewing the risks and                            benefits, the patient was deemed in satisfactory                            condition to undergo the procedure.                           After obtaining informed consent, the scope was                            passed under direct vision. The [id number]                            Colonoscope [id number] was  introduced through the anus                            and advanced to the the sigmoid colon. The flexible                            sigmoidoscopy was accomplished without difficulty.                            The patient tolerated the procedure well. The                            quality of the bowel preparation was fair. Scope In: Scope Out: Findings:                 Hemorrhoids were found on perianal exam.                           Anal papilla(e) were hypertrophied. Biopsies were                            taken with a cold forceps for histology to reassess                            for AIN.                           A moderate amount of semi-solid stool was found in                            the rectum, in the recto-sigmoid colon and in the                            sigmoid colon, making visualization difficult.                            Lavage of the colon was performed using copious                            amounts  of fluid. I could clear the rectosigmoid,                            no polyps. The rectum had some residual solid                            stool. Most of this was able to be lavaged however                            some remained in the dependant portion of the                            rectum. I could navigate around it a bit and                            lavaged and no obvious polypoid lesions noted in                            the area.                           The exam was otherwise without abnormality. Complications:            No immediate complications. Estimated blood loss:                            Minimal. Estimated Blood Loss:     Estimated blood loss was minimal. Impression:               - Preparation of the colon was fair at best with                            significant lavage, but mostly adequate views were                            able to be obtained - no polyps appreciated.                           - Hemorrhoids found on perianal  exam.                           - Anal papilla(e) were hypertrophied. Biopsied to                            rule out AIN.                           - The examination was otherwise normal. Recommendation:           - Discharge patient to home.                           - Resume previous diet.                           - Continue  present medications.                           - Await pathology results. Elspeth P. Zahari Xiang, MD 05/08/2024 11:01:38 AM This report has been signed electronically.

## 2024-05-08 NOTE — Progress Notes (Signed)
 Brandi Stephens   Primary Care Physician:  Brandi Alm RAMAN, PA-C   Reason for Procedure:   History of colon polyps, altered bowel habits  Plan:    Flex sig     HPI: Brandi Stephens is a 79 y.o. female  here for flex sig surveillance. She has a history of polyps, last colonoscopy 2024 - rectum / rectosigmoid could not be cleared of residual stool, rest of colon examined okay. She has also had altered bowel habits with some loose stools previously but states she has since resolved. Enema given pre procedure to further clear her out as she was not sure if she was cleared out well enough. She also has a history of AIN.   Otherwise feels well without any cardiopulmonary symptoms.   I have discussed risks / benefits of anesthesia and endoscopic procedure with Brandi Stephens and they wish to proceed with the exams as outlined today.   The patient was provided an opportunity to ask questions and all were answered. The patient agreed with the plan.    Past Medical History:  Diagnosis Date   AIN (anal intraepithelial neoplasia) anal canal    Anemia    with pregnancy   Anxiety    Arthritis    knees, lower back, knees   Chronic constipation    Chronic diastolic CHF (congestive heart failure) (HCC) 07/05/2019   CKD (chronic kidney disease)    Coarse tremors    Essential   COPD (chronic obstructive pulmonary disease) (HCC)    2017 chest CT, history of   Degenerative lumbar spinal stenosis    Depression    Depression 07/05/2019   Drug dependency (HCC)    Elevated PTHrP level 05/09/2019   EtOH use 07/05/2019   Pt denies heavy drinking however   Gastroparesis    GERD (gastroesophageal reflux disease)    pt denies   Gout    Grade I diastolic dysfunction 02/01/2016   Noted ECHO   Hemorrhoids    History of adenomatous polyp of colon    History of basal cell carcinoma (BCC) excision 2015   nose   History of sepsis 05/14/2014   post lumbar surgery    History of shingles    x2   Hyperlipidemia    Hypertension    Hypothyroidism    Irregular heart beat    history of while undergoing radiation   Malignant neoplasm of upper-inner quadrant of left breast in female, estrogen receptor positive [facility]) oncologist-  dr layla--  per lov in epic no recurrence   dx 07-13-2015  Left breast invasive ductal carcinoma, Stage IA, Grade 1 (TXN0),  09-16-2015  s/p  left breast lumpectomy with sln bx's,  completed radiation 11-18-2015,  started antiestrogen therapy 12-14-2015   Neuropathy    Left foot and back of left leg   Obesity (BMI 30-39.9) 07/05/2019   Personal history of radiation therapy    completed 11-18-2015  left breast   Pneumonia    PONV (postoperative nausea and vomiting)    Prediabetes    diet controlled, no med   Restless leg syndrome    Seasonal allergies    Seizure (HCC) 05/2015   due to sepsis, only one time episode   Tremor    Wears partial dentures    lower     Past Surgical History:  Procedure Laterality Date   ABDOMINAL HYSTERECTOMY  1985   BASAL CELL CARCINOMA EXCISION  10/15   nose   BIOPSY  07/22/2022  Procedure: BIOPSY;  Surgeon: Brandi Gwendlyn DASEN, MD;  Location: [facility];  Service: Gastroenterology;;   BREAST BIOPSY Right 08/24/2015   BREAST BIOPSY Right 07/13/2015   BREAST BIOPSY Left 07/13/2015   BREAST EXCISIONAL BIOPSY Left 1972   BREAST LUMPECTOMY Left    BREAST LUMPECTOMY WITH RADIOACTIVE SEED AND SENTINEL LYMPH NODE BIOPSY Left 09/16/2015   Procedure: BREAST LUMPECTOMY WITH RADIOACTIVE SEED AND SENTINEL LYMPH NODE BIOPSY;  Surgeon: Brandi Nephew, MD;  Location: [facility];  Service: General;  Laterality: Left;   BREAST REDUCTION SURGERY Bilateral 1998   CATARACT EXTRACTION W/ INTRAOCULAR LENS IMPLANT Left 2016   CERVICAL SPINE SURGERY  1995   hallo   COLONOSCOPY  01/2013   polyps   ESOPHAGOGASTRODUODENOSCOPY N/A 07/22/2022   Procedure: ESOPHAGOGASTRODUODENOSCOPY (EGD);  Surgeon:  Brandi Gwendlyn DASEN, MD;  Location: [facility];  Service: Gastroenterology;  Laterality: N/A;   EYE SURGERY Left 2016   HAND SURGERY Left 02-19-2002   dr murrell  @[facility]    repair collateral ligament/ MPJ left thumb   HARDWARE REMOVAL N/A 07/24/2019   Procedure: REVISION OF HARDWARE Lumbar two - Sacral one. Extention of Fusion to Lumbar one;  Surgeon: Brandi Pupa, MD;  Location: [facility] OR;  Service: Neurosurgery;  Laterality: N/A;   HEMORRHOID SURGERY  2008   HIGH RESOLUTION ANOSCOPY N/A 02/28/2018   Procedure: HIGH RESOLUTION ANOSCOPY WITH BIOPSY;  Surgeon: Brandi Hila, MD;  Location: [facility] [place];  Service: General;  Laterality: N/A;   KNEE ARTHROSCOPY Right 2002   LUMBAR SPINE SURGERY  03-20-2015   dr Brandi   fusion L4-5, L5-S1   MOUTH SURGERY  07/14/2018   2 infected teeth removed   MULTIPLE TOOTH EXTRACTIONS     with bone grafting lower bottom right and left   REDUCTION MAMMAPLASTY Bilateral    RIGHT COLECTOMY  09-10-2003   dr merrilyn @[facility]    multiple colon polyps (per path tubular adenoma's, hyperplastic , benign appendix, benign two lymph nodes   ROTATOR CUFF REPAIR Left 04/2016   TONSILLECTOMY  1969   TUBAL LIGATION Bilateral 1982    Prior to Admission medications  Medication Sig Start Date End Date Taking? Authorizing Provider  traZODone  (DESYREL ) 50 MG tablet Take 50 mg by mouth at bedtime. 05/05/24  Yes [provider]  allopurinol  (ZYLOPRIM ) 100 MG tablet TAKE 2 TABLETS BY MOUTH EVERY DAY 04/03/24   Tysinger, Alm RAMAN, PA-C  amitriptyline  (ELAVIL ) 25 MG tablet Take 2 tablets (50 mg total) by mouth at bedtime. 04/04/24   Raulkar, Sven SQUIBB, MD  Biotin  3 MG TABS Take 3 mg by mouth daily.    [provider]  cefadroxil  (DURICEF) 500 MG capsule Take 1 capsule (500 mg total) by mouth 2 (two) times daily. 01/02/23   Luiz Channel, MD  Cholecalciferol  (VITAMIN D3) 50 MCG (2000 UT) capsule Take 1 capsule (2,000 Units total) by mouth daily. 02/06/24    Tysinger, Alm RAMAN, PA-C  colchicine  0.6 MG tablet TAKE 2 TABLETS BY MOUTH ONCE, THEN 1 TABLET AN HOUR LATER, THEN 1 TABLET TWICE DAILY FOR 3 DAYS 02/06/24   Tysinger, Alm RAMAN, PA-C  cyanocobalamin  1000 MCG tablet Take 1,000 mcg by mouth daily.    [provider]  Dulaglutide  (TRULICITY ) 0.75 MG/0.5ML SOAJ Inject 0.75 mg into the skin once a week. 03/01/24   Tysinger, Alm RAMAN, PA-C  furosemide  (LASIX ) 20 MG tablet Take 1 tablet (20 mg total) by mouth daily as needed for fluid. 02/05/24   Lucien Orren SAILOR, PA-C  levothyroxine  (SYNTHROID ) 75 MCG tablet Take 1 tablet (75 mcg total) by mouth daily before breakfast. 02/06/24   Tysinger, Alm RAMAN, PA-C  lidocaine  (HM LIDOCAINE  PATCH) 4 % Place 1 patch onto the skin daily.    [provider]  meloxicam  (MOBIC ) 7.5 MG tablet Take 1 tablet (7.5 mg total) by mouth daily. 02/27/24   Tysinger, Alm RAMAN, PA-C  methocarbamol  (ROBAXIN ) 500 MG tablet Take 1 tablet (500 mg total) by mouth 3 (three) times daily as needed for muscle spasms. 02/06/24   Tysinger, Alm RAMAN, PA-C  metoprolol  tartrate (LOPRESSOR ) 25 MG tablet Take 0.5 tablets (12.5 mg total) by mouth 2 (two) times daily. 02/28/24   Lucien Orren SAILOR, PA-C  midodrine  (PROAMATINE ) 2.5 MG tablet Take 1 tablet (2.5 mg total) by mouth 2 (two) times daily with a meal. 02/06/24   Tysinger, Alm RAMAN, PA-C  midodrine  (PROAMATINE ) 5 MG tablet TAKE 1 TABLET (5 MG TOTAL) BY MOUTH 2 (TWO) TIMES DAILY WITH A MEAL. 05/02/24   Tysinger, Alm RAMAN, PA-C  ondansetron  (ZOFRAN ) 4 MG tablet Take 1 tablet (4 mg total) by mouth every 6 (six) hours as needed for nausea. 12/21/23   Sherrill Cable Latif, DO  pantoprazole  (PROTONIX ) 40 MG tablet Take 40 mg by mouth 2 (two) times daily. 02/13/24   [provider]  polyethylene glycol powder (GLYCOLAX /MIRALAX ) 17 GM/SCOOP powder Take 17 g by mouth daily as needed for moderate constipation.    [provider]  pregabalin  (LYRICA ) 50 MG capsule Take 1 capsule (50 mg  total) by mouth 3 (three) times daily. 04/04/24   Raulkar, Sven SQUIBB, MD  Propylene Glycol (SYSTANE BALANCE OP) Place 1 drop into both eyes 4 (four) times daily as needed (dry eyes).    [provider]  senna-docusate (SENOKOT-S) 8.6-50 MG tablet Take 1 tablet by mouth at bedtime as needed for mild constipation. 12/21/23   Sheikh, Omair Latif, DO    Current Outpatient Medications  Medication Sig Dispense Refill   traZODone  (DESYREL ) 50 MG tablet Take 50 mg by mouth at bedtime.     allopurinol  (ZYLOPRIM ) 100 MG tablet TAKE 2 TABLETS BY MOUTH EVERY DAY 180 tablet 1   amitriptyline  (ELAVIL ) 25 MG tablet Take 2 tablets (50 mg total) by mouth at bedtime. 60 tablet 1   Biotin  3 MG TABS Take 3 mg by mouth daily.     cefadroxil  (DURICEF) 500 MG capsule Take 1 capsule (500 mg total) by mouth 2 (two) times daily. 60 capsule 11   Cholecalciferol  (VITAMIN D3) 50 MCG (2000 UT) capsule Take 1 capsule (2,000 Units total) by mouth daily. 90 capsule 3   colchicine  0.6 MG tablet TAKE 2 TABLETS BY MOUTH ONCE, THEN 1 TABLET AN HOUR LATER, THEN 1 TABLET TWICE DAILY FOR 3 DAYS 30 tablet 2   cyanocobalamin  1000 MCG tablet Take 1,000 mcg by mouth daily.     Dulaglutide  (TRULICITY ) 0.75 MG/0.5ML SOAJ Inject 0.75 mg into the skin once a week. 2 mL 0   furosemide  (LASIX ) 20 MG tablet Take 1 tablet (20 mg total) by mouth daily as needed for fluid. 30 tablet 0   levothyroxine  (SYNTHROID ) 75 MCG tablet Take 1 tablet (75 mcg total) by mouth daily before breakfast. 90 tablet 0   lidocaine  (HM LIDOCAINE  PATCH) 4 % Place 1 patch onto the skin daily.     meloxicam  (MOBIC ) 7.5 MG tablet Take 1 tablet (7.5 mg total) by mouth daily. 30 tablet 0   methocarbamol  (ROBAXIN ) 500 MG tablet  Take 1 tablet (500 mg total) by mouth 3 (three) times daily as needed for muscle spasms. 90 tablet 1   metoprolol  tartrate (LOPRESSOR ) 25 MG tablet Take 0.5 tablets (12.5 mg total) by mouth 2 (two) times daily. 60 tablet 3   midodrine   (PROAMATINE ) 2.5 MG tablet Take 1 tablet (2.5 mg total) by mouth 2 (two) times daily with a meal. 60 tablet 3   midodrine  (PROAMATINE ) 5 MG tablet TAKE 1 TABLET (5 MG TOTAL) BY MOUTH 2 (TWO) TIMES DAILY WITH A MEAL. 60 tablet 1   ondansetron  (ZOFRAN ) 4 MG tablet Take 1 tablet (4 mg total) by mouth every 6 (six) hours as needed for nausea. 20 tablet 0   pantoprazole  (PROTONIX ) 40 MG tablet Take 40 mg by mouth 2 (two) times daily.     polyethylene glycol powder (GLYCOLAX /MIRALAX ) 17 GM/SCOOP powder Take 17 g by mouth daily as needed for moderate constipation.     pregabalin  (LYRICA ) 50 MG capsule Take 1 capsule (50 mg total) by mouth 3 (three) times daily. 90 capsule 1   Propylene Glycol (SYSTANE BALANCE OP) Place 1 drop into both eyes 4 (four) times daily as needed (dry eyes).     senna-docusate (SENOKOT-S) 8.6-50 MG tablet Take 1 tablet by mouth at bedtime as needed for mild constipation.     Current Facility-Administered Medications  Medication Dose Route Frequency Provider Last Rate Last Admin   0.9 %  sodium chloride  infusion  500 mL Intravenous Once Nanami Whitelaw, Elspeth SQUIBB, MD        Allergies as of 05/08/2024 - Review Complete 05/08/2024  Allergen Reaction Noted   Ampicillin  Hives and Other (See Comments) 09/30/2015   Gabapentin  Swelling 02/12/2019   Penicillins Hives, Other (See Comments), and Rash 03/11/2011   Percocet [oxycodone -acetaminophen ] Hives and Itching 03/08/2023   Crestor [rosuvastatin] Other (See Comments) 02/13/2023   Hydrocodone  Itching 03/20/2015   Hydromorphone  Itching 03/13/2015   Other Itching 02/26/2018    Family History  Problem Relation Age of Onset   Atrial fibrillation Mother    Ulcers Father    Breast cancer Maternal Aunt    Lung cancer Maternal Uncle    Stomach cancer Maternal Grandmother    Lung cancer Maternal Aunt    Brain cancer Maternal Aunt    Prostate cancer Maternal Grandfather    Stroke Paternal Grandmother    Tuberculosis Paternal  Grandfather    Colon cancer Neg Hx    Esophageal cancer Neg Hx    Rectal cancer Neg Hx     Social History   Socioeconomic History   Marital status: Married    Spouse name: robert   Number of children: 3   Years of education: college   Highest education level: Not on file  Occupational History   Occupation: retired  Tobacco Use   Smoking status: Former    Current packs/day: 0.00    Average packs/day: 0.5 packs/day for 35.0 years (17.5 ttl pk-yrs)    Types: Cigarettes    Start date: 05/23/1983    Quit date: 05/22/2018    Years since quitting: 5.9   Smokeless tobacco: Never  Vaping Use   Vaping status: Never Used  Substance and Sexual Activity   Alcohol  use: Yes    Comment: 1 glass of wine a night   Drug use: Not Currently    Comment: CBD tincture   Sexual activity: Yes    Birth control/protection: Post-menopausal  Other Topics Concern   Not on file  Social History Narrative  Lives with husband in a 2 story home.  Has 2 living children.  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  Education: college.    Right-handed.   Five cups coffee per week.   Social Drivers of Health   Tobacco Use: Medium Risk (05/08/2024)   Patient History    Smoking Tobacco Use: Former    Smokeless Tobacco Use: Never    Passive Exposure: Not on file  Financial Resource Strain: Low Risk (05/02/2023)   Overall Financial Resource Strain (CARDIA)    Difficulty of Paying Living Expenses: Not hard at all  Food Insecurity: No Food Insecurity (05/07/2024)   Epic    Worried About Programme Researcher, Broadcasting/film/video in the Last Year: Never true    Ran Out of Food in the Last Year: Never true  Transportation Needs: No Transportation Needs (05/07/2024)   Epic    Lack of Transportation (Medical): No    Lack of Transportation (Non-Medical): No  Stephens Activity: Inactive (05/07/2024)   Exercise Vital Sign    Days of Exercise per Week: 0 days    Minutes of Exercise per Session: 0 min  Stress: Stress Concern Present  (05/07/2024)   [employer] of Occupational Health - Occupational Stress Questionnaire    Feeling of Stress: To some extent  Social Connections: Socially Integrated (05/07/2024)   Social Connection and Isolation Panel    Frequency of Communication with Friends and Family: More than three times a week    Frequency of Social Gatherings with Friends and Family: More than three times a week    Attends Religious Services: 1 to 4 times per year    Active Member of [employer] or Organizations: Yes    Attends Banker Meetings: 1 to 4 times per year    Marital Status: Married  Catering Manager Violence: Not At Risk (05/07/2024)   Epic    Fear of Current or Ex-Partner: No    Emotionally Abused: No    Physically Abused: No    Sexually Abused: No  Depression (PHQ2-9): Low Risk (05/07/2024)   Depression (PHQ2-9)    PHQ-2 Score: 4  Alcohol  Screen: Low Risk (05/02/2023)   Alcohol  Screen    Last Alcohol  Screening Score (AUDIT): 4  Housing: Low Risk (05/07/2024)   Epic    Unable to Pay for Housing in the Last Year: No    Number of Times Moved in the Last Year: 1    Homeless in the Last Year: No  Utilities: Not At Risk (05/07/2024)   Epic    Threatened with loss of utilities: No  Health Literacy: Adequate Health Literacy (05/07/2024)   B1300 Health Literacy    Frequency of need for help with medical instructions: Never    Review of Systems: All other review of systems negative except as mentioned in the HPI.  Stephens Exam: Vital signs BP 128/76   Pulse 86   Temp (!) 97.2 F (36.2 C) (Temporal)   Ht 5' 7 (1.702 m)   Wt 180 lb (81.6 kg)   SpO2 95%   BMI 28.19 kg/m   General:   Alert,  Well-developed, pleasant and cooperative in NAD Lungs:  Clear throughout to auscultation.   Heart:  Regular rate and rhythm Abdomen:  Soft, nontender and nondistended.   Neuro/Psych:  Alert and cooperative. Normal mood and affect. A and O x 3  Marcey Naval, MD [facility]  Gastroenterology

## 2024-05-08 NOTE — Patient Instructions (Signed)
 Discharge instructions given. Handout on Hemorrhoids. Biopsies taken. Resume previous medications. YOU HAD AN ENDOSCOPIC PROCEDURE TODAY AT THE [facility]:   Refer to the procedure report that was given to you for any specific questions about what was found during the examination.  If the procedure report does not answer your questions, please call your gastroenterologist to clarify.  If you requested that your care partner not be given the details of your procedure findings, then the procedure report has been included in a sealed envelope for you to review at your convenience later.  YOU SHOULD EXPECT: Some feelings of bloating in the abdomen. Passage of more gas than usual.  Walking can help get rid of the air that was put into your GI tract during the procedure and reduce the bloating. If you had a lower endoscopy (such as a colonoscopy or flexible sigmoidoscopy) you may notice spotting of blood in your stool or on the toilet paper. If you underwent a bowel prep for your procedure, you may not have a normal bowel movement for a few days.  Please Note:  You might notice some irritation and congestion in your nose or some drainage.  This is from the oxygen  used during your procedure.  There is no need for concern and it should clear up in a day or so.  SYMPTOMS TO REPORT IMMEDIATELY:  Following lower endoscopy (colonoscopy or flexible sigmoidoscopy):  Excessive amounts of blood in the stool  Significant tenderness or worsening of abdominal pains  Swelling of the abdomen that is new, acute  Fever of 100F or higher   For urgent or emergent issues, a gastroenterologist can be reached at any hour by calling [phone]. Do not use MyChart messaging for urgent concerns.    DIET:  We do recommend a small meal at first, but then you may proceed to your regular diet.  Drink plenty of fluids but you should avoid alcoholic beverages for 24 hours.  ACTIVITY:  You should plan to take  it easy for the rest of today and you should NOT DRIVE or use heavy machinery until tomorrow (because of the sedation medicines used during the test).    FOLLOW UP: Our staff will call the number listed on your records the next business day following your procedure.  We will call around 7:15- 8:00 am to check on you and address any questions or concerns that you may have regarding the information given to you following your procedure. If we do not reach you, we will leave a message.     If any biopsies were taken you will be contacted by phone or by letter within the next 1-3 weeks.  Please call us  at [phone] if you have not heard about the biopsies in 3 weeks.    SIGNATURES/CONFIDENTIALITY: You and/or your care partner have signed paperwork which will be entered into your electronic medical record.  These signatures attest to the fact that that the information above on your After Visit Summary has been reviewed and is understood.  Full responsibility of the confidentiality of this discharge information lies with you and/or your care-partner.

## 2024-05-08 NOTE — Progress Notes (Signed)
 Sedate, gd SR, tolerated procedure well, VSS, report to RN

## 2024-05-08 NOTE — Progress Notes (Signed)
 Pt states unable to give second enema, states her hemorrhoiods were too swollen for her to insert the enema she had caramel colored water with last bm, reported to Dr Leigh, order for enema rec'd.  Enema given, pt retained for  10 minutes with   some solid stool and watery brown results     Updated medical record with pt

## 2024-05-08 NOTE — Progress Notes (Signed)
 Left VM for patient.

## 2024-05-08 NOTE — Progress Notes (Signed)
 Called to room to assist during endoscopic procedure.  Patient ID and intended procedure confirmed with present staff. Received instructions for my participation in the procedure from the performing physician.

## 2024-05-09 ENCOUNTER — Telehealth: Payer: Self-pay

## 2024-05-09 ENCOUNTER — Other Ambulatory Visit: Payer: Self-pay

## 2024-05-09 NOTE — Telephone Encounter (Signed)
" °  Follow up Call-     05/08/2024    9:17 AM 01/24/2023   10:27 AM  Call back number  Post procedure Call Back phone  # [phone]  Permission to leave phone message Yes Yes     Patient questions:  Do you have a fever, pain , or abdominal swelling? No. Pain Score  0 *  Have you tolerated food without any problems? Yes.    Have you been able to return to your normal activities? Yes.    Do you have any questions about your discharge instructions: Diet   No. Medications  No. Follow up visit  No.  Do you have questions or concerns about your Care? No.  Actions: * If pain score is 4 or above: No action needed, pain <4.   "

## 2024-05-09 NOTE — Telephone Encounter (Unsigned)
 Copied from CRM #[id number]. Topic: Clinical - Home Health Verbal Orders >> May 09, 2024  4:18 PM Jasmin G wrote: Caller/Agency: Ms. Elenor from [facility] Callback Number: [phone] Service Requested: Physical Therapy Frequency: 1 week 8 Any new concerns about the patient? No

## 2024-05-10 ENCOUNTER — Ambulatory Visit: Admitting: Physician Assistant

## 2024-05-10 MED ORDER — MELOXICAM 7.5 MG PO TABS: 7.5000 mg | ORAL_TABLET | Freq: Every day | ORAL | 0 refills | Status: AC

## 2024-05-13 ENCOUNTER — Ambulatory Visit: Payer: Self-pay | Admitting: Gastroenterology

## 2024-05-13 LAB — SURGICAL PATHOLOGY

## 2024-05-13 NOTE — Telephone Encounter (Signed)
 PAP OZEMPIC  ended, pt d/c

## 2024-05-31 ENCOUNTER — Encounter: Admitting: Physical Medicine and Rehabilitation

## 2024-06-03 ENCOUNTER — Encounter: Admitting: Physical Medicine and Rehabilitation

## 2024-06-03 ENCOUNTER — Ambulatory Visit: Admitting: Physician Assistant

## 2024-06-25 ENCOUNTER — Ambulatory Visit: Admitting: Endocrinology

## 2024-06-28 ENCOUNTER — Encounter: Admitting: Physical Medicine and Rehabilitation

## 2024-07-08 ENCOUNTER — Encounter: Admitting: Physical Medicine and Rehabilitation

## 2024-07-18 ENCOUNTER — Ambulatory Visit: Admitting: Physician Assistant
